# Patient Record
Sex: Male | Born: 1940 | Race: White | Hispanic: No | Marital: Single | Smoking: Former smoker
Health system: Southern US, Community
[De-identification: ages and names within clinical notes are randomized; demographics above are authoritative.]

## PROBLEM LIST (undated history)

## (undated) ENCOUNTER — Emergency Department (HOSPITAL_COMMUNITY): Admission: EM | Payer: Medicare Other

## (undated) DIAGNOSIS — E278 Other specified disorders of adrenal gland: Secondary | ICD-10-CM

## (undated) DIAGNOSIS — K5780 Diverticulitis of intestine, part unspecified, with perforation and abscess without bleeding: Secondary | ICD-10-CM

## (undated) DIAGNOSIS — C801 Malignant (primary) neoplasm, unspecified: Secondary | ICD-10-CM

## (undated) DIAGNOSIS — R339 Retention of urine, unspecified: Secondary | ICD-10-CM

## (undated) DIAGNOSIS — IMO0001 Reserved for inherently not codable concepts without codable children: Secondary | ICD-10-CM

## (undated) DIAGNOSIS — I482 Chronic atrial fibrillation, unspecified: Secondary | ICD-10-CM

## (undated) DIAGNOSIS — Z923 Personal history of irradiation: Secondary | ICD-10-CM

## (undated) DIAGNOSIS — K219 Gastro-esophageal reflux disease without esophagitis: Secondary | ICD-10-CM

## (undated) DIAGNOSIS — F1021 Alcohol dependence, in remission: Secondary | ICD-10-CM

## (undated) DIAGNOSIS — I313 Pericardial effusion (noninflammatory): Secondary | ICD-10-CM

## (undated) DIAGNOSIS — D638 Anemia in other chronic diseases classified elsewhere: Secondary | ICD-10-CM

## (undated) DIAGNOSIS — E46 Unspecified protein-calorie malnutrition: Secondary | ICD-10-CM

## (undated) DIAGNOSIS — T4145XA Adverse effect of unspecified anesthetic, initial encounter: Secondary | ICD-10-CM

## (undated) DIAGNOSIS — E871 Hypo-osmolality and hyponatremia: Principal | ICD-10-CM

## (undated) DIAGNOSIS — D6481 Anemia due to antineoplastic chemotherapy: Secondary | ICD-10-CM

## (undated) DIAGNOSIS — I3139 Other pericardial effusion (noninflammatory): Secondary | ICD-10-CM

## (undated) DIAGNOSIS — R918 Other nonspecific abnormal finding of lung field: Secondary | ICD-10-CM

## (undated) DIAGNOSIS — C3490 Malignant neoplasm of unspecified part of unspecified bronchus or lung: Secondary | ICD-10-CM

## (undated) DIAGNOSIS — S82899A Other fracture of unspecified lower leg, initial encounter for closed fracture: Secondary | ICD-10-CM

## (undated) DIAGNOSIS — F32A Depression, unspecified: Secondary | ICD-10-CM

## (undated) DIAGNOSIS — R530 Neoplastic (malignant) related fatigue: Secondary | ICD-10-CM

## (undated) DIAGNOSIS — F419 Anxiety disorder, unspecified: Secondary | ICD-10-CM

## (undated) DIAGNOSIS — I4819 Other persistent atrial fibrillation: Secondary | ICD-10-CM

## (undated) DIAGNOSIS — I10 Essential (primary) hypertension: Secondary | ICD-10-CM

## (undated) DIAGNOSIS — R296 Repeated falls: Secondary | ICD-10-CM

## (undated) DIAGNOSIS — E2749 Other adrenocortical insufficiency: Secondary | ICD-10-CM

## (undated) DIAGNOSIS — F329 Major depressive disorder, single episode, unspecified: Secondary | ICD-10-CM

## (undated) DIAGNOSIS — S7290XA Unspecified fracture of unspecified femur, initial encounter for closed fracture: Secondary | ICD-10-CM

## (undated) DIAGNOSIS — IMO0002 Reserved for concepts with insufficient information to code with codable children: Secondary | ICD-10-CM

## (undated) DIAGNOSIS — W19XXXA Unspecified fall, initial encounter: Secondary | ICD-10-CM

## (undated) DIAGNOSIS — T451X5A Adverse effect of antineoplastic and immunosuppressive drugs, initial encounter: Secondary | ICD-10-CM

## (undated) DIAGNOSIS — N400 Enlarged prostate without lower urinary tract symptoms: Secondary | ICD-10-CM

## (undated) HISTORY — DX: Malignant (primary) neoplasm, unspecified: C80.1

## (undated) HISTORY — DX: Other nonspecific abnormal finding of lung field: R91.8

## (undated) HISTORY — DX: Alcohol dependence, in remission: F10.21

## (undated) HISTORY — DX: Benign prostatic hyperplasia without lower urinary tract symptoms: N40.0

## (undated) HISTORY — PX: OTHER SURGICAL HISTORY: SHX169

## (undated) HISTORY — DX: Personal history of irradiation: Z92.3

## (undated) HISTORY — PX: COLON SURGERY: SHX602

## (undated) HISTORY — DX: Reserved for inherently not codable concepts without codable children: IMO0001

## (undated) HISTORY — DX: Anemia due to antineoplastic chemotherapy: D64.81

## (undated) HISTORY — DX: Hypo-osmolality and hyponatremia: E87.1

## (undated) HISTORY — DX: Neoplastic (malignant) related fatigue: R53.0

## (undated) HISTORY — DX: Other specified disorders of adrenal gland: E27.8

## (undated) HISTORY — DX: Reserved for concepts with insufficient information to code with codable children: IMO0002

## (undated) HISTORY — PX: TONSILLECTOMY: SUR1361

## (undated) HISTORY — PX: HEMORRHOID SURGERY: SHX153

## (undated) HISTORY — DX: Other adrenocortical insufficiency: E27.49

## (undated) HISTORY — DX: Adverse effect of antineoplastic and immunosuppressive drugs, initial encounter: T45.1X5A

## (undated) HISTORY — PX: COLONOSCOPY: SHX174

## (undated) HISTORY — DX: Anemia in other chronic diseases classified elsewhere: D63.8

---
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
## 1993-12-05 HISTORY — PX: ANKLE ARTHRODESIS: SUR49

## 1998-08-10 ENCOUNTER — Emergency Department (HOSPITAL_COMMUNITY): Admission: EM | Admit: 1998-08-10 | Discharge: 1998-08-10 | Payer: Self-pay | Admitting: Emergency Medicine

## 1998-09-25 ENCOUNTER — Inpatient Hospital Stay (HOSPITAL_COMMUNITY): Admission: EM | Admit: 1998-09-25 | Discharge: 1998-09-26 | Payer: Self-pay | Admitting: Emergency Medicine

## 1998-09-25 ENCOUNTER — Encounter: Payer: Self-pay | Admitting: Emergency Medicine

## 2003-08-10 ENCOUNTER — Emergency Department (HOSPITAL_COMMUNITY): Admission: EM | Admit: 2003-08-10 | Discharge: 2003-08-10 | Payer: Self-pay | Admitting: Emergency Medicine

## 2003-12-06 HISTORY — PX: EXCISION / CURETTAGE BONE CYST FINGER: SUR476

## 2003-12-09 ENCOUNTER — Ambulatory Visit (HOSPITAL_COMMUNITY): Admission: RE | Admit: 2003-12-09 | Discharge: 2003-12-09 | Payer: Self-pay | Admitting: Orthopedic Surgery

## 2003-12-09 ENCOUNTER — Ambulatory Visit (HOSPITAL_BASED_OUTPATIENT_CLINIC_OR_DEPARTMENT_OTHER): Admission: RE | Admit: 2003-12-09 | Discharge: 2003-12-09 | Payer: Self-pay | Admitting: Orthopedic Surgery

## 2004-02-12 ENCOUNTER — Emergency Department (HOSPITAL_COMMUNITY): Admission: EM | Admit: 2004-02-12 | Discharge: 2004-02-12 | Payer: Self-pay | Admitting: Emergency Medicine

## 2004-07-01 ENCOUNTER — Emergency Department (HOSPITAL_COMMUNITY): Admission: EM | Admit: 2004-07-01 | Discharge: 2004-07-01 | Payer: Self-pay | Admitting: Emergency Medicine

## 2005-08-01 ENCOUNTER — Inpatient Hospital Stay (HOSPITAL_COMMUNITY): Admission: EM | Admit: 2005-08-01 | Discharge: 2005-08-02 | Payer: Self-pay | Admitting: Emergency Medicine

## 2007-03-05 ENCOUNTER — Ambulatory Visit (HOSPITAL_COMMUNITY): Admission: RE | Admit: 2007-03-05 | Discharge: 2007-03-05 | Payer: Self-pay | Admitting: Chiropractic Medicine

## 2007-05-29 ENCOUNTER — Inpatient Hospital Stay (HOSPITAL_COMMUNITY): Admission: EM | Admit: 2007-05-29 | Discharge: 2007-05-30 | Payer: Self-pay | Admitting: Emergency Medicine

## 2010-08-31 ENCOUNTER — Emergency Department (HOSPITAL_COMMUNITY): Admission: EM | Admit: 2010-08-31 | Discharge: 2010-08-31 | Payer: Self-pay | Admitting: Emergency Medicine

## 2010-09-01 ENCOUNTER — Emergency Department (HOSPITAL_COMMUNITY)
Admission: EM | Admit: 2010-09-01 | Discharge: 2010-09-01 | Payer: Self-pay | Source: Home / Self Care | Admitting: Emergency Medicine

## 2010-09-02 ENCOUNTER — Ambulatory Visit: Payer: Self-pay | Admitting: Hematology and Oncology

## 2010-09-03 LAB — PROTHROMBIN TIME: INR: 0.9 (ref <1.50)

## 2010-09-03 LAB — COMPREHENSIVE METABOLIC PANEL
Albumin: 4.2 g/dL (ref 3.5–5.2)
Creatinine, Ser: 0.88 mg/dL (ref 0.40–1.50)
Sodium: 129 mEq/L — ABNORMAL LOW (ref 135–145)
Total Bilirubin: 1.8 mg/dL — ABNORMAL HIGH (ref 0.3–1.2)
Total Protein: 6.7 g/dL (ref 6.0–8.3)

## 2010-09-03 LAB — CBC WITH DIFFERENTIAL/PLATELET
BASO%: 0.1 % (ref 0.0–2.0)
EOS%: 1.7 % (ref 0.0–7.0)
LYMPH%: 11.6 % — ABNORMAL LOW (ref 14.0–49.0)
MCHC: 35.1 g/dL (ref 32.0–36.0)
RDW: 13.5 % (ref 11.0–14.6)
WBC: 9.2 10*3/uL (ref 4.0–10.3)
lymph#: 1.1 10*3/uL (ref 0.9–3.3)

## 2010-09-03 LAB — MORPHOLOGY: PLT EST: ADEQUATE

## 2010-09-03 LAB — APTT: aPTT: 29 seconds (ref 24–37)

## 2010-09-03 LAB — LACTATE DEHYDROGENASE: LDH: 132 U/L (ref 94–250)

## 2010-09-07 ENCOUNTER — Ambulatory Visit (HOSPITAL_COMMUNITY): Admission: RE | Admit: 2010-09-07 | Discharge: 2010-09-07 | Payer: Self-pay | Admitting: Hematology and Oncology

## 2010-09-07 LAB — SPEP & IFE WITH QIG
Albumin ELP: 64.2 % (ref 55.8–66.1)
Beta 2: 3.6 % (ref 3.2–6.5)
Beta Globulin: 5.5 % (ref 4.7–7.2)

## 2010-09-07 LAB — PSA: PSA: 4.56 ng/mL — ABNORMAL HIGH (ref 0.10–4.00)

## 2010-09-08 ENCOUNTER — Ambulatory Visit (HOSPITAL_COMMUNITY): Admission: RE | Admit: 2010-09-08 | Discharge: 2010-09-08 | Payer: Self-pay | Admitting: Hematology and Oncology

## 2010-09-09 LAB — RETICULOCYTES
RBC: 3.28 10*6/uL — ABNORMAL LOW (ref 4.20–5.82)
Retic %: 3.62 % — ABNORMAL HIGH (ref 0.50–1.60)
Retic Ct Abs: 118.74 10*3/uL — ABNORMAL HIGH (ref 24.10–77.50)

## 2010-09-09 LAB — IRON AND TIBC
Iron: 90 ug/dL (ref 42–165)
TIBC: 286 ug/dL (ref 215–435)

## 2010-09-09 LAB — FERRITIN: Ferritin: 833 ng/mL — ABNORMAL HIGH (ref 22–322)

## 2010-09-17 LAB — IMMUNOFIXATION ELECTROPHORESIS
IgA: 132 mg/dL (ref 68–378)
IgG (Immunoglobin G), Serum: 840 mg/dL (ref 694–1618)
IgM, Serum: 97 mg/dL (ref 60–263)

## 2010-11-25 ENCOUNTER — Encounter
Admission: RE | Admit: 2010-11-25 | Discharge: 2010-11-25 | Payer: Self-pay | Source: Home / Self Care | Attending: Family Medicine | Admitting: Family Medicine

## 2010-12-14 ENCOUNTER — Encounter
Admission: RE | Admit: 2010-12-14 | Discharge: 2010-12-14 | Payer: Managed Care, Other (non HMO) | Source: Home / Self Care | Attending: Orthopedic Surgery | Admitting: Orthopedic Surgery

## 2011-02-17 LAB — BASIC METABOLIC PANEL
BUN: 10 mg/dL (ref 6–23)
BUN: 12 mg/dL (ref 6–23)
CO2: 25 mEq/L (ref 19–32)
Calcium: 9.4 mg/dL (ref 8.4–10.5)
Chloride: 91 mEq/L — ABNORMAL LOW (ref 96–112)
Creatinine, Ser: 0.97 mg/dL (ref 0.4–1.5)
Creatinine, Ser: 1.06 mg/dL (ref 0.4–1.5)
GFR calc Af Amer: >60 mL/min (ref >60)
GFR calc non Af Amer: >60 mL/min (ref >60)
GFR calc non Af Amer: >60 mL/min (ref >60)
Glucose, Bld: 120 mg/dL — ABNORMAL HIGH (ref 70–99)
Potassium: 4.5 mEq/L (ref 3.5–5.1)
Potassium: 5.2 mEq/L — ABNORMAL HIGH (ref 3.5–5.1)
Sodium: 127 mEq/L — ABNORMAL LOW (ref 135–145)

## 2011-02-17 LAB — DIFFERENTIAL
Basophils Relative: 0 % (ref 0–1)
Eosinophils Relative: 0 % (ref 0–5)
Lymphocytes Relative: 7 % — ABNORMAL LOW (ref 12–46)
Lymphs Abs: 0.6 10*3/uL — ABNORMAL LOW (ref 0.7–4.0)
Monocytes Absolute: 0.6 10*3/uL (ref 0.1–1.0)
Neutro Abs: 7.2 10*3/uL (ref 1.7–7.7)
Neutro Abs: 7.3 10*3/uL (ref 1.7–7.7)
Neutrophils Relative %: 83 % — ABNORMAL HIGH (ref 43–77)

## 2011-02-17 LAB — CBC
HCT: 33.6 % — ABNORMAL LOW (ref 39.0–52.0)
HCT: 40.6 % (ref 39.0–52.0)
Hemoglobin: 14.8 g/dL (ref 13.0–17.0)
MCHC: 36.3 g/dL — ABNORMAL HIGH (ref 30.0–36.0)
MCHC: 36.5 g/dL — ABNORMAL HIGH (ref 30.0–36.0)
MCV: 99 fL (ref 78.0–100.0)
Platelets: 255 10*3/uL (ref 150–400)
RBC: 3.36 MIL/uL — ABNORMAL LOW (ref 4.22–5.81)
RDW: 13.3 % (ref 11.5–15.5)
WBC: 8.7 10*3/uL (ref 4.0–10.5)

## 2011-02-17 LAB — POCT CARDIAC MARKERS

## 2011-04-19 NOTE — Discharge Summary (Signed)
Anthony Curry, Anthony Curry               ACCOUNT NO.:  [id number]   MEDICAL RECORD NO.:  [id number]          PATIENT TYPE:  INP   LOCATION:  6707                         FACILITY:  [facility]   PHYSICIAN:  Kela Millin, M.D.DATE OF BIRTH:  12/23/1940   DATE OF ADMISSION:  05/29/2007  DATE OF DISCHARGE:  05/30/2007                               DISCHARGE SUMMARY   PRIMARY CARE PHYSICIAN:  Molly Maduro A. Nicholos Johns, M.D.   CHIEF COMPLAINT:  Question of chills and chest pain.   HISTORY OF PRESENT ILLNESS:  The patient is a 70 year old white male  with a past medical history significant for GERD, hypertension,  hyponatremia, and syncope in the past, who presents with the above  complaint.  He states that he was in his usual state of health until the  morning of admission when he just had generalized malaise and then felt  cold and shaky.  Because he continued to feel this way for most of the  morning, he decided to come to the ER.  On the way to the ER, his wife  was driving.  He began having heartburn.  He states that he has had  this intermittently and is on Prilosec and Zantac at home for GERD and  that he had taken his medications for the day.  He describes the chest  pain as a burning sensation, 5/10 in intensity at its worst, in the  middle of his upper chest area, and denies associated nausea, vomiting,  radiation, diaphoresis, and no shortness of breath.  He states that  three days ago, he had a similar pain, and it resolved on its own.  He  denies cough, hematemesis, abdominal pain, diarrhea, constipation,  dysuria, and no hematochezia.  It was noted that the patient had been  admitted in 2006 with similar complaints, and workup at that time  included a stress Cardiolite, which was within normal limits.   The patient was seen in the ER, and his labs revealed a sodium of 121.  His point-of-care markers were negative.  Urinalysis showed 3-6 WBCs,  many bacteria, and cloudy appearance and small  leukocyte esterase.  He  is admitted for further evaluation and management.   PAST MEDICAL HISTORY:  As stated above.  Noted per E-chart that the  patient's hyponatremia was attributed to diuretic use, and it was  discontinued, but they noted that the hyponatremia persisted even  thereafter.  History of heavy alcohol use.   MEDICATIONS:  1. Carafate p.r.n.  2. Norvasc 10 mg daily.  3. Atenolol 100 mg daily.  4. Benicar/HCT 40/25 mg daily.  5. Zantac 300 b.i.d.  6. Prilosec.   ALLERGIES:  NKDA.   SOCIAL HISTORY:  He states that he drinks about six beers a day as well  as wine with his dinner and vodka about once a week.  The patient states  that he has never had alcohol withdrawal/DTs.  He quit tobacco about 7-  10 years ago.   FAMILY HISTORY:  His brother had MI x2, age unknown.  Also had  hypertension and is deceased secondary to brain tumor.  His mother had  lung cancer, coronary artery disease.  His father, hypertension, and his  sister, hypertension.   REVIEW OF SYSTEMS:  As per HPI, other review of systems negative.   PHYSICAL EXAMINATION:  GENERAL:  The patient is an elderly male.  He is  alert, oriented, in no apparent distress.  VITAL SIGNS:  His temperature is 97.9, blood pressure 154/72, pulse 86,  respiratory rate 18, O2 sat 99%.  HEENT:  PERRL.  EOMI.  Sclerae are anicteric.  Moist mucous membranes.  No oral exudate.  NECK:  Supple.  No adenopathy.  No thyromegaly.  No JVD.  LUNGS:  Moderate air movement.  No crackles.  No wheezes.  CARDIOVASCULAR:  Regular rate and rhythm.  Normal S1 and S2.  ABDOMEN:  Soft, bowel sounds present.  Nontender.  Nondistended.  No  masses palpable.  EXTREMITIES:  No cyanosis and no edema.  No tremors noted.  NEURO:  He is alert and oriented x3.  Cranial nerves II-XII are grossly  intact.  Strength is 5/5 and symmetric.  Tongue midline.   LABORATORY DATA:  His urinalysis is as per HPI.  Sodium 121, potassium  3.6, chloride 88,  BUN 9, glucose 126.  His pH is 7.4, pCO2 41.  His  white cell count is 7.4, hemoglobin 15, platelet count 267.  His MCV is  99.8, neutrophil count is 81%.  Point-of-care markers are negative x1.   His EKG shows some ST depression in lead II and nonsignificant  depression in III and aVF.   ASSESSMENT/PLAN:  1. Chest pain:  As discussed above with above-noted EKG findings.  The      patient is given nitroglycerin, GI cocktail, and morphine in the ER      without relief.  Will obtain serial cardiac enzymes, start on      aspirin, nitroglycerin.  Continue beta blocker and ARB.  Will place      patient on PPI in effort to cover GI, as he has a history of GERD,      as discussed.  Will obtain a fasting lipid profile and follow.      Also follow and consider cardiac consultation pending the enzymes.  2. Hyponatremia:  Likely diuretic-induced.  Will discontinue HCTZ,      hydrate, check a TSH, and follow.  3. Probable urinary tract infection:  Obtain cultures, empiric      antibiotics.  4. 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  Will continue PPI and      Carafate, as above.  5. Heavy alcohol use:  Will place on multivitamins, thiamine, and      Ativan p.r.n.  Social work/case management consult for community      resources to quit alcohol.  6. Hypertension:  Will continue outpatient medications except for      HCTZ, as discussed above.      Kela Millin, M.D.  Electronically Signed     ACV/MEDQ  D:  05/30/2007  T:  05/30/2007  Job:  [id number]   cc:   Molly Maduro A. Nicholos Johns, M.D.

## 2011-04-19 NOTE — Discharge Summary (Signed)
NAMEDONNOVAN, STAMOUR               ACCOUNT NO.:  [id number]   MEDICAL RECORD NO.:  [id number]          PATIENT TYPE:  INP   LOCATION:  6707                         FACILITY:  [facility]   PHYSICIAN:  Corinna L. Lendell Caprice, MDDATE OF BIRTH:  [birth year]   DATE OF ADMISSION:  05/29/2007  DATE OF DISCHARGE:  05/30/2007                               DISCHARGE SUMMARY   DISCHARGE DIAGNOSES:  1. Hyponatremia.  2. Chest pain, suspect gastroesophageal reflux disease.  3. Hypokalemia.  4. Hypertension.  5. Asymptomatic bacteriuria.   DISCHARGE MEDICATIONS:  1. He is to take Benicar 40 mg a day instead of Benicar/HCT.  2. KCl 40 mEq daily for 5 more days.  3. Prilosec 40 mg a day.  4. Stop Zantac.  5. Continue amlodipine 10 mg a day.  6. Atenolol 100 mg a day.  7. Carafate q.i.d.   FOLLOW UP:  With Dr. Elias Else within 1 week to check sodium and  potassium levels.   CONDITION:  Stable.   CONSULTATIONS:  None.   DIET:  Regular.   ACTIVITY:  Ad lib.   PERTINENT LABORATORIES:  CBC unremarkable.  Initial sodium was 121,  potassium 3.6, chloride 88, glucose 126, BUN 9, creatinine 0.8,  bicarbonate 25.  At discharge, his sodium is 129, potassium is 3 before  repletion, chloride 93.  Serial cardiac enzymes negative.  LDL 95, HDL  55, total cholesterol 158, triglycerides 40.  Urinalysis showed small  leukocyte esterase, negative nitrites, 3-6 white cells, 0-2 red cells,  many bacteria.   SPECIAL STUDIES:  EKG showed normal sinus rhythm, with nonspecific  changes   HISTORY AND HOSPITAL COURSE:  Mr. Normington is a pleasant 70 year old white  male who presented with jitteriness. He also had chest burning.  He had  a negative stress test in 2006, and has a history of gastroesophageal  reflux disease.  He had been on Zantac and Carafate as an outpatient,  but had been on proton pump inhibitor in the past, which seemed to  improve his reflux symptoms better than Zantac.  He was started on  Benicar/HCT as few months ago, and actually has a history of  hyponatremia in the past related to diuretic, according to the patient.  There is also concern of alcohol abuse.  The patient reports 2-6 drinks  a day, but denies alcohol abuse. The case was discussed with the  admitting physician, Dr. Suanne Marker.  However, no H&P is currently available  in the computer.  Please see H&P for complete admission details.  The  patient was started on normal saline.  His hydrochlorothiazide was  stopped, and he was placed on telemetry, where he remained in normal  sinus rhythm.  He had normal vital signs.  His TSH was normal at 2.23.  His jitteriness resolved, as did his chest pain, and he is requesting to  go home.  He is ambulating fine and is alert and oriented.  His lungs  are clear.  His heart is regular.  Abdomen soft.  Extremities  extremities without clubbing, cyanosis, or edema.  I have recommended  that he take  Prilosec 40 mg a day instead of the Zantac.  We have given  him a dose of KCl prior to discharge, and I have given him a  prescription for 5 more days of potassium.  I have recommended that he  stop the Benicar/HCT and instead take Benicar 40 mg a day.  He will need  a follow-up basic metabolic panel next week with Dr. Nicholos Johns.  Initially,  the patient was a full admission, but his status was changed to  observation.      Corinna L. Lendell Caprice, MD  Electronically Signed     CLS/MEDQ  D:  05/30/2007  T:  05/31/2007  Job:  [id number]   cc:   Molly Maduro A. Nicholos Johns, M.D.

## 2011-04-22 NOTE — Op Note (Signed)
NAME:  ZHYON, ANTENUCCI                         ACCOUNT NO.:  [id number]   MEDICAL RECORD NO.:  [id number]                   PATIENT TYPE:  AMB   LOCATION:  DSC                                  FACILITY:  [facility]   PHYSICIAN:  Katy Fitch. Naaman Plummer., M.D.          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   OPERATION:  Excision of a mucous cyst on the dorsal ulnar aspect of the  right thumb nail fold followed  by arthrotomy of the IP joint  with removal  of multiple loose bodies and debridement of osteophytes  and joint  irrigation with sterile saline.   SURGEON:  Katy Fitch. Sypher, M.D.   ASSISTANT:  Marveen Reeks. Dasnoit, P.A.-C.   ANESTHESIA:  0.25% Marcaine and 2% Lidocaine metacarpal head level block of  right thumb, supplemented by IV sedation, supervising anesthesiologist  Janetta Hora. Gelene Mink, M.D.   INDICATIONS FOR PROCEDURE:  Mataeo Ingwersen is a 70 year old gentleman  referred  for evaluation  and management  of a very large mucous cyst involving the  right thumb IP joint  and nail fold region. He had minor nail dystrophy. He  had underlying osteoarthrosis evidenced by Heberden's and Bouchard's nodes.  Plain films demonstrated significant arthritis of his thumb IP joint.  After informed consent was obtained he is brought to the operating room at  this time for debridement of his IP joint, cyst excision and  nail fold  decompression.   DESCRIPTION OF PROCEDURE:   Allin Frix is brought to the operating room and  placed in the supine position upon the operating table. Following light  sedation, the right arm was prepped with Betadine soap and solution and  sterilely draped. Then 0.25% Marcaine and 2% Lidocaine was infiltrated at  the metacarpal head level to attain a digital block. When anesthesia was  satisfactory the procedure commenced with exsanguination of the thumb and  placement of a 1/2 inch Penrose drain at the base of the thumb as a digital  tourniquet.   A curvilinear incision was fashioned directly over  the cyst. The  subcutaneous tissues were carefully divided, identifying the cyst cavity.  The membrane of the cyst was circumferentially dissected and removed. This  was followed back to the junction  of  the extensor tendon and ulnar  collateral ligament. A triangular portion of the capsule was excised,  followed  by exploration of the IP joint.   The IP  joint  was irrigated with 3 rather sizeable osseocartilaginous loose  bodies being recovered. The osteophytes on the adjacent surfaces of the  proximal and distal phalanges were  excised with a small  rongeur. The joint  was then thoroughly lavaged with sterile saline utilizing an 18 gauge needle  and syringe to create significant pressure. The wound was then repaired with  interrupted suture of 5-0 nylon followed  by placement  of a sterile  dressing of Xeroform, sterile gauze and Coban.   Mr. Waymon Amato tolerated the procedure well. He was transferred awake and alert to  the recovery room with stable vital signs.                                               Katy Fitch Naaman Plummer., M.D.    RVS/MEDQ  D:  12/09/2003  T:  12/09/2003  Job:  [id number]   cc:   Molly Maduro A. Nicholos Johns, M.D.  [address]  [place]  [place] [address]  Fax: [phone]

## 2011-04-22 NOTE — H&P (Signed)
NAMEGLEEN, RIPBERGER NO.:  [id number]   MEDICAL RECORD NO.:  [id number]          PATIENT TYPE:  EMS   LOCATION:  MAJO                         FACILITY:  [facility]   PHYSICIAN:  Deirdre Peer. Polite, M.D. DATE OF BIRTH:  [birth year]   DATE OF ADMISSION:  08/01/2005  DATE OF DISCHARGE:                                HISTORY & PHYSICAL   CHIEF COMPLAINT:  Chest discomfort.   HISTORY OF PRESENT ILLNESS:  A 70 year old male with known history of  hypertension, GERD, recent evaluation for syncope approximately three weeks  ago in [place] who presents to the ED for evaluation of funny feeling in  his chest.  Patient states that he was in his usual state of health until  approximately 8:00 yesterday.  Patient had an uncomfortable sensation in his  chest which seemed to radiate to both sides of his chest and had association  of his arm feeling cool.  Patient denies any palpitations.  No diaphoresis.  Patient states that he has had symptoms like this before attributed to his  GERD.  Typically when the patient has this sensation he takes simethicone  and gets relief.  However, after yesterday's event the patient did not  experience any relief.  Patient had persistent discomfort throughout the  night.  He was unable to sleep.  Because of that the patient presented to  the ED for evaluation.  In the ED the patient was evaluated.  Cardiac  enzymes within normal limits.  EKG within normal limits.  At baseline  patient denies any orthopnea, PND, or dyspnea on exertion.  Is unable to  really ascertain the patient's exercise tolerance as he does not exert  himself.  Patient does have a family history of early coronary artery  disease, brother with an MI x2, mother with coronary artery disease.  Patient denies high cholesterol, diabetes, or CVA.  Patient does admit to  having trouble with abnormal sensation in his chest approximately three to  four years ago and at that time had a stress  test reportedly with negative  results.  Please note also the patient has had a recent evaluation  approximately three weeks ago at the hospital in [place] for evaluation of  syncope presumed secondary to orthostasis plus or minus BP medications.  At  this time the results of that evaluation are unavailable.  Because of the  patient's sensation of atypical chest pain and risk factors for coronary  disease, i.e., family history, tobacco use, hypertension, and male greater  than 55 admission is deemed necessary for further evaluation and treatment.   PAST MEDICAL HISTORY:  1.  Hypertension.  2.  GERD.  3.  ETOH use to excess.  4.  Recent history of syncope approximately three weeks ago.  5.  Hyponatremia presumed secondary to diuretic; however, persists after      discontinuing diuretic.   MEDICATIONS ON ADMISSION:  Benicar, atenolol, Prilosec.   SOCIAL HISTORY:  Negative for tobacco x15 years.  Prior to that two packs a  day x30 years.  No drugs.  Alcohol to excess approximately six beers a  day.   PAST SURGICAL HISTORY:  Significant for hemorrhoid surgery in the distant  past and right ankle surgery for a fracture.   ALLERGIES:  No known drug allergies.   FAMILY HISTORY:  Brother:  History of MI x2, deceased secondary to a brain  tumor.  Also had hypertension.  Mother with coronary artery disease and lung  cancer.  Father:  Hypertension.  Sister:  Hypertension.   REVIEW OF SYSTEMS:  As stated in HPI.  Negative for headache, dizziness,  nausea, vomiting, diarrhea, constipation.  No blood in the stool.  No blood  in the urine.  No hemoptysis.  No productive cough.   PHYSICAL EXAMINATION:  GENERAL:  Patient is alert and oriented x3.  No  apparent distress.  VITAL SIGNS:  Temperature 98, blood pressure 192/99, pulse 64, respiratory  rate 20, saturating 100%.  HEENT:  Within normal limits.  CHEST:  Clear to auscultation without rales, rhonchi, or rub.  CARDIOVASCULAR:  Regular S1,  S2.  No S3 appreciated.  No murmurs, rubs, or  gallops.  ABDOMEN:  Soft, nontender.  No hepatosplenomegaly.  EXTREMITIES:  No clubbing, cyanosis, edema.  Patient does have deformity in  the right ankles most likely secondary to prior surgery.  Pulse 2+, intact.  NEUROLOGIC:  Nonfocal.   DATA:  EKG:  Normal sinus rhythm.  No Qs.  No ST elevation.  Chest x-ray:  Hyperinflated lung fields consistent with COPD plus or minus emphysema.  CBC:  White count 6.1, hemoglobin 16, MCV 100.7, platelets 273, neutrophil  count 75, myoglobin 68.3.  CK-MB less than 1, troponin I less than 0.05.  BMET:  Sodium 125, potassium 4.2, chloride 89, carbon dioxide 26, glucose  120, BUN 7, creatinine 0.8, calcium 9.3, AST and ALT 26 and 19,  respectively, bilirubin 1.5, lipase 19.   ASSESSMENT:  1.  Atypical chest pain.  Differential diagnosis includes coronary artery      disease versus gastroesophageal reflux disease versus pulmonary      embolism.  2.  Hypertension.  3.  Gastroesophageal reflux disease.  4.  History of syncope evaluated approximately three weeks ago.  5.  Hyponatremia of 125.  Please note the patient has been off diuretic.      Must rule out secondary to excessive beer ingestion which could be      acting as a diuretic versus other etiology.  6.  Alcohol use to excess.  Recommend patient be admitted to a telemetry      floor bed.  Patient will have serial cardiac enzymes.  Will obtain a D-      dimer.  If positive will obtain CT of chest to rule out pulmonary      embolism.  Will check lipids and thyroid function studies.  Will request      a cardiac evaluation for further risk stratification.  If the above      studies are negative patient will require GI evaluation on an outpatient      basis.      Deirdre Peer. Polite, M.D.  Electronically Signed     RDP/MEDQ  D:  08/01/2005  T:  08/01/2005  Job:  [id number]  cc:   Molly Maduro A. Nicholos Johns, M.D.  Fax: [phone]

## 2011-04-22 NOTE — Consult Note (Signed)
NAMEKASHMERE, STAFFA NO.:  [id number]   MEDICAL RECORD NO.:  [id number]          PATIENT TYPE:  INP   LOCATION:  2034                         FACILITY:  [facility]   PHYSICIAN:  Meade Maw, M.D.    DATE OF BIRTH:  1941-06-16   DATE OF CONSULTATION:  08/01/2005  DATE OF DISCHARGE:                                   CONSULTATION   REFERRING PHYSICIAN:  Dr. Elana Alm. [name].   INDICATION FOR CONSULTATION:  Atypical chest pain.   HISTORY:  Anthony Curry is a very pleasant 70 year old gentleman with past medical  history of hypertension, tobacco use and family history, who recently had an  episode of near-syncope while vacationing in [place].  He was admitted to  the hospital and was noted to be orthostatic.  He was found to have  hyponatremia and hypokalemia; this was felt to be secondary to  hydrochlorothiazide; his hydrochlorothiazide was subsequently discontinued.  The patient now presents with a 2-day to 3-day history of epigastric  discomfort described as an uncomfortable sensation that previously had been  relieved with simethicone, but notes no relief at this time with Mylanta,  PPI or simethicone.  The pain has been constant.  There appear to be no  aggravating or alleviating factors noted.  The patient has had similar  symptoms 3 years prior.  Stress test performed in [place] was negative for  inducible ischemia.   REVIEW OF SYSTEMS:  He has had no fevers, no chills, no cough.  He has had  no peripheral edema, no bowel or bladder changes, no palpitations, no  tachyarrhythmias.  He has had no further dizziness since his discharge from  [place].   PAST MEDICAL HISTORY:  1.  Hypertension.  2.  GERD.  3.  Alcohol misuse.   SOCIAL HISTORY:  He stopped smoking 15 years prior and smoked 2 packs per  day for 30 years, daily alcohol intake, has retired in March of this year,  is married and lives with his wife.   FAMILY HISTORY:  He had 1 brother with myocardial  infarction at age 55 who  continues to smoke heavily.  Mother had coronary artery disease at age 59.  Father had hypertension, sister with hypertension.   ALLERGIES:  He has no known drug allergies.   MEDICATIONS PRIOR TO ADMISSION:  Prilosec, atenolol and Benicar.   PHYSICAL EXAMINATION:  GENERAL:  Physical exam reveals an elderly male  appearing his stated age in no acute distress.  He continues to have the  uncomfortable sensation in his mid-chest.  VITAL SIGNS:  Blood pressure is 156/87.  He is afebrile.  Heart rate is 63.  HEENT:  Unremarkable.  There is no neck vein distention.  PULMONARY:  Exam reveals breath sounds which are equal and clear to  auscultation.  No use of accessory muscles.  His O2 saturation is 97% on  room air.  CARDIOVASCULAR:  Exam reveals a regular rate and rhythm, normal S1, normal  S2.  No murmurs, rubs, or gallops noted.  ABDOMEN:  Soft, benign and nontender.  EXTREMITIES:  Extremities reveal no peripheral edema.  There is no clubbing  or cyanosis.  His distal pulses are weak but palpable.  NEUROLOGIC:  Nonfocal.  SKIN:  Warm and dry.   LABORATORY DATA:  White count 6.1, hematocrit 45, platelet count 273,000.  Potassium is 4.2, creatinine 0.8.  Liver function tests reveal mild  elevation in his total bilirubin.  Lipase is normal.  His serial cardiac  enzymes have been negative.  His D-dimer is mildly elevated at 0.61.   His chest x-ray reveals COPD.   His ECG reveals sinus rhythm with T wave inversions in aVL, low voltage, no  change from his ECG in January 2005.   IMPRESSION:  1.  Chest pain, atypical for cardiac.  We will proceed with a stress      Cardiolite, in view of his risk factors.  Would recommend a fasting      lipid profile if this has not improved if this has not previously been      performed.  I would also consider further evaluation for possible      pulmonary embolus in view of this mildly elevated D-dimer.  2.  Hypertension.   Blood pressure is controlled.  We will continue with      angiotensin II receptor blocker and beta blockers.  3.  Hyponatremia.  He continues to have low sodium at 125.  Of note, the      patient is off the hydrochlorothiazide.  This may be related to his      daily beer consumption.  4.  Gastroesophageal reflux disease.  Continue with his proton pump      inhibitor.  5.  Insomnia.  The patient has had difficulty with sleep since being in the      hospital.  We will prescribe with Ambien 10 mg tonight.      Meade Maw, M.D.  Electronically Signed     HP/MEDQ  D:  08/01/2005  T:  08/02/2005  Job:  [id number]

## 2011-05-13 ENCOUNTER — Other Ambulatory Visit: Payer: Self-pay | Admitting: Family Medicine

## 2011-05-13 DIAGNOSIS — R918 Other nonspecific abnormal finding of lung field: Secondary | ICD-10-CM

## 2011-06-02 ENCOUNTER — Ambulatory Visit
Admission: RE | Admit: 2011-06-02 | Discharge: 2011-06-02 | Disposition: A | Discharge status: Home / Self Care | Payer: Medicare Other | Source: Ambulatory Visit | Attending: Family Medicine | Admitting: Family Medicine

## 2011-06-02 DIAGNOSIS — R918 Other nonspecific abnormal finding of lung field: Secondary | ICD-10-CM

## 2011-09-21 LAB — CARDIAC PANEL(CRET KIN+CKTOT+MB+TROPI)
CK, MB: 2.4
CK, MB: 2.6
Relative Index: INVALID
Relative Index: INVALID
Total CK: 104
Total CK: 83
Troponin I: 0.02

## 2011-09-21 LAB — CBC
Hemoglobin: 13.8
Hemoglobin: 15
RBC: 3.94 — ABNORMAL LOW
RBC: 4.3
RDW: 12.2
WBC: 7.4

## 2011-09-21 LAB — I-STAT 8, (EC8 V) (CONVERTED LAB)
Acid-Base Excess: 1
BUN: 9
Chloride: 88 — ABNORMAL LOW
Potassium: 3.6
pH, Ven: 7.401 — ABNORMAL HIGH

## 2011-09-21 LAB — BASIC METABOLIC PANEL
Calcium: 8.6
GFR calc Af Amer: >60
GFR calc non Af Amer: >60
Sodium: 129 — ABNORMAL LOW

## 2011-09-21 LAB — URINALYSIS, ROUTINE W REFLEX MICROSCOPIC
Bilirubin Urine: NEGATIVE
Glucose, UA: NEGATIVE
Hgb urine dipstick: NEGATIVE
Specific Gravity, Urine: 1.013
pH: 6

## 2011-09-21 LAB — DIFFERENTIAL
Basophils Absolute: 0
Lymphocytes Relative: 10 — ABNORMAL LOW
Lymphs Abs: 0.7
Monocytes Absolute: 0.7
Monocytes Relative: 9
Neutro Abs: 5.9

## 2011-09-21 LAB — LIPID PANEL: Cholesterol: 158

## 2011-09-21 LAB — URINE MICROSCOPIC-ADD ON

## 2011-09-21 LAB — POCT CARDIAC MARKERS: Myoglobin, poc: 94.5

## 2011-09-21 LAB — POCT I-STAT CREATININE: Operator id: 146091

## 2011-10-15 ENCOUNTER — Emergency Department (HOSPITAL_COMMUNITY): Payer: Medicare Other

## 2011-10-15 ENCOUNTER — Other Ambulatory Visit: Payer: Self-pay

## 2011-10-15 ENCOUNTER — Inpatient Hospital Stay (HOSPITAL_COMMUNITY)
Admission: EM | Admit: 2011-10-15 | Discharge: 2011-10-18 | DRG: 392 | Disposition: A | Discharge status: Home / Self Care | Payer: Medicare Other | Source: Ambulatory Visit | Attending: Internal Medicine | Admitting: Internal Medicine

## 2011-10-15 ENCOUNTER — Encounter: Payer: Self-pay | Admitting: Emergency Medicine

## 2011-10-15 DIAGNOSIS — F1010 Alcohol abuse, uncomplicated: Secondary | ICD-10-CM

## 2011-10-15 DIAGNOSIS — E871 Hypo-osmolality and hyponatremia: Secondary | ICD-10-CM

## 2011-10-15 DIAGNOSIS — K219 Gastro-esophageal reflux disease without esophagitis: Secondary | ICD-10-CM | POA: Diagnosis present

## 2011-10-15 DIAGNOSIS — R112 Nausea with vomiting, unspecified: Secondary | ICD-10-CM

## 2011-10-15 DIAGNOSIS — E876 Hypokalemia: Secondary | ICD-10-CM

## 2011-10-15 DIAGNOSIS — M810 Age-related osteoporosis without current pathological fracture: Secondary | ICD-10-CM | POA: Diagnosis present

## 2011-10-15 DIAGNOSIS — I119 Hypertensive heart disease without heart failure: Secondary | ICD-10-CM

## 2011-10-15 DIAGNOSIS — R1013 Epigastric pain: Principal | ICD-10-CM | POA: Diagnosis present

## 2011-10-15 DIAGNOSIS — K8020 Calculus of gallbladder without cholecystitis without obstruction: Secondary | ICD-10-CM

## 2011-10-15 DIAGNOSIS — I10 Essential (primary) hypertension: Secondary | ICD-10-CM

## 2011-10-15 DIAGNOSIS — R1083 Colic: Secondary | ICD-10-CM

## 2011-10-15 HISTORY — DX: Essential (primary) hypertension: I10

## 2011-10-15 LAB — COMPREHENSIVE METABOLIC PANEL
ALT: 22 U/L (ref 0–53)
AST: 32 U/L (ref 0–37)
Albumin: 4 g/dL (ref 3.5–5.2)
CO2: 20 mEq/L (ref 19–32)
Calcium: 9.8 mg/dL (ref 8.4–10.5)
Chloride: 84 mEq/L — ABNORMAL LOW (ref 96–112)
GFR calc non Af Amer: >90 mL/min (ref >90)
Sodium: 121 mEq/L — ABNORMAL LOW (ref 135–145)

## 2011-10-15 LAB — CBC
HCT: 42.9 % (ref 39.0–52.0)
MCH: 35.5 pg — ABNORMAL HIGH (ref 26.0–34.0)
MCH: 37.4 pg — ABNORMAL HIGH (ref 26.0–34.0)
MCHC: 37.8 g/dL — ABNORMAL HIGH (ref 30.0–36.0)
MCV: 99.1 fL (ref 78.0–100.0)
Platelets: 191 10*3/uL (ref 150–400)
Platelets: 178 10*3/uL (ref 150–400)
RBC: 4.14 MIL/uL — ABNORMAL LOW (ref 4.22–5.81)
RDW: 12.8 % (ref 11.5–15.5)
WBC: 7.3 10*3/uL (ref 4.0–10.5)
WBC: 9.3 10*3/uL (ref 4.0–10.5)

## 2011-10-15 LAB — MAGNESIUM: Magnesium: 1.5 mg/dL (ref 1.5–2.5)

## 2011-10-15 LAB — URINALYSIS, ROUTINE W REFLEX MICROSCOPIC
Glucose, UA: NEGATIVE mg/dL
Hgb urine dipstick: NEGATIVE
Ketones, ur: 15 mg/dL — AB
Protein, ur: NEGATIVE mg/dL
pH: 7.5 (ref 5.0–8.0)

## 2011-10-15 LAB — CREATININE, SERUM: GFR calc Af Amer: >90 mL/min (ref >90)

## 2011-10-15 LAB — POCT I-STAT TROPONIN I: Troponin i, poc: 0 ng/mL (ref 0.00–0.08)

## 2011-10-15 LAB — LIPASE, BLOOD: Lipase: 18 U/L (ref 11–59)

## 2011-10-15 MED ORDER — POTASSIUM CHLORIDE CRYS ER 20 MEQ PO TBCR
40.0000 meq | EXTENDED_RELEASE_TABLET | Freq: Every day | ORAL | Status: AC
  Administered 2011-10-15: 40 meq via ORAL

## 2011-10-15 MED ORDER — POTASSIUM CHLORIDE CRYS ER 20 MEQ PO TBCR
40.0000 meq | EXTENDED_RELEASE_TABLET | Freq: Once | ORAL | Status: AC
  Administered 2011-10-15: 40 meq via ORAL
  Filled 2011-10-15: qty 2

## 2011-10-15 MED ORDER — SODIUM CHLORIDE 0.9 % IV BOLUS (SEPSIS)
500.0000 mL | Freq: Once | INTRAVENOUS | Status: AC
  Administered 2011-10-15: 500 mL via INTRAVENOUS

## 2011-10-15 MED ORDER — OLMESARTAN MEDOXOMIL 40 MG PO TABS
40.0000 mg | ORAL_TABLET | Freq: Every day | ORAL | Status: DC
  Administered 2011-10-16 – 2011-10-18 (×3): 40 mg via ORAL
  Filled 2011-10-15 (×4): qty 1

## 2011-10-15 MED ORDER — MORPHINE SULFATE 4 MG/ML IJ SOLN
4.0000 mg | Freq: Once | INTRAMUSCULAR | Status: AC
  Administered 2011-10-15: 4 mg via INTRAVENOUS
  Filled 2011-10-15: qty 1

## 2011-10-15 MED ORDER — AMLODIPINE BESYLATE 10 MG PO TABS
10.0000 mg | ORAL_TABLET | Freq: Every day | ORAL | Status: DC
  Administered 2011-10-16 – 2011-10-18 (×3): 10 mg via ORAL
  Filled 2011-10-15 (×4): qty 1

## 2011-10-15 MED ORDER — PANTOPRAZOLE SODIUM 40 MG IV SOLR
40.0000 mg | Freq: Once | INTRAVENOUS | Status: AC
  Administered 2011-10-15: 40 mg via INTRAVENOUS
  Filled 2011-10-15: qty 40

## 2011-10-15 MED ORDER — SODIUM CHLORIDE 0.9 % IV SOLN: INTRAVENOUS | Status: DC

## 2011-10-15 MED ORDER — PANTOPRAZOLE SODIUM 40 MG PO TBEC
40.0000 mg | DELAYED_RELEASE_TABLET | Freq: Two times a day (BID) | ORAL | Status: DC
  Administered 2011-10-16 – 2011-10-18 (×5): 40 mg via ORAL
  Filled 2011-10-15 (×5): qty 1

## 2011-10-15 MED ORDER — MAGNESIUM OXIDE 400 MG PO TABS
400.0000 mg | ORAL_TABLET | Freq: Every day | ORAL | Status: DC
  Administered 2011-10-15 – 2011-10-18 (×4): 400 mg via ORAL
  Filled 2011-10-15 (×4): qty 1

## 2011-10-15 MED ORDER — VITAMIN B-1 100 MG PO TABS
100.0000 mg | ORAL_TABLET | Freq: Every day | ORAL | Status: DC
  Administered 2011-10-16 – 2011-10-18 (×3): 100 mg via ORAL
  Filled 2011-10-15 (×3): qty 1

## 2011-10-15 MED ORDER — THIAMINE HCL 100 MG/ML IJ SOLN
Freq: Once | INTRAVENOUS | Status: AC
  Administered 2011-10-15: 21:00:00 via INTRAVENOUS
  Filled 2011-10-15: qty 1000

## 2011-10-15 MED ORDER — ONDANSETRON HCL 4 MG/2ML IJ SOLN
4.0000 mg | Freq: Once | INTRAMUSCULAR | Status: AC
  Administered 2011-10-15: 4 mg via INTRAVENOUS
  Filled 2011-10-15: qty 2

## 2011-10-15 MED ORDER — ONDANSETRON HCL 4 MG/2ML IJ SOLN: 4.0000 mg | Freq: Four times a day (QID) | INTRAMUSCULAR | Status: DC | PRN

## 2011-10-15 MED ORDER — ONDANSETRON HCL 4 MG PO TABS: 4.0000 mg | ORAL_TABLET | Freq: Four times a day (QID) | ORAL | Status: DC | PRN

## 2011-10-15 MED ORDER — TRAMADOL HCL 50 MG PO TABS
50.0000 mg | ORAL_TABLET | Freq: Four times a day (QID) | ORAL | Status: DC | PRN
  Administered 2011-10-15 – 2011-10-17 (×4): 50 mg via ORAL
  Filled 2011-10-15 (×4): qty 1

## 2011-10-15 MED ORDER — ATENOLOL 100 MG PO TABS
100.0000 mg | ORAL_TABLET | Freq: Every day | ORAL | Status: DC
  Administered 2011-10-15 – 2011-10-18 (×4): 100 mg via ORAL
  Filled 2011-10-15 (×4): qty 1

## 2011-10-15 MED ORDER — MORPHINE SULFATE 2 MG/ML IJ SOLN
1.0000 mg | INTRAMUSCULAR | Status: DC | PRN
  Administered 2011-10-15 – 2011-10-16 (×2): 1 mg via INTRAVENOUS
  Filled 2011-10-15 (×2): qty 1

## 2011-10-15 MED ORDER — IOHEXOL 300 MG/ML  SOLN
85.0000 mL | Freq: Once | INTRAMUSCULAR | Status: AC | PRN
  Administered 2011-10-15: 85 mL via INTRAVENOUS

## 2011-10-15 MED ORDER — ACETAMINOPHEN 650 MG RE SUPP: 650.0000 mg | Freq: Four times a day (QID) | RECTAL | Status: DC | PRN

## 2011-10-15 MED ORDER — ENOXAPARIN SODIUM 40 MG/0.4ML ~~LOC~~ SOLN
40.0000 mg | SUBCUTANEOUS | Status: DC
  Administered 2011-10-15 – 2011-10-17 (×3): 40 mg via SUBCUTANEOUS
  Filled 2011-10-15 (×4): qty 0.4

## 2011-10-15 MED ORDER — LORAZEPAM 0.5 MG PO TABS: 0.5000 mg | ORAL_TABLET | ORAL | Status: DC | PRN

## 2011-10-15 MED ORDER — IOHEXOL 350 MG/ML SOLN
80.0000 mL | Freq: Once | INTRAVENOUS | Status: AC | PRN
  Administered 2011-10-15: 80 mL via INTRAVENOUS

## 2011-10-15 MED ORDER — ACETAMINOPHEN 325 MG PO TABS: 650.0000 mg | ORAL_TABLET | Freq: Four times a day (QID) | ORAL | Status: DC | PRN

## 2011-10-15 MED ORDER — ALUM & MAG HYDROXIDE-SIMETH 200-200-20 MG/5ML PO SUSP
30.0000 mL | Freq: Four times a day (QID) | ORAL | Status: DC | PRN
  Administered 2011-10-16: 30 mL via ORAL
  Filled 2011-10-15: qty 30

## 2011-10-15 NOTE — ED Notes (Signed)
Patient is resting comfortably. Pain improved.

## 2011-10-15 NOTE — ED Notes (Signed)
To ultrasound

## 2011-10-15 NOTE — ED Provider Notes (Addendum)
History     CSN: [id number] Arrival date & time: 10/15/2011  7:41 AM   First MD Initiated Contact with Patient 10/15/11 [phone]      Chief Complaint  Patient presents with  . Abdominal Pain  . Chest Pain    (Consider location/radiation/quality/duration/timing/severity/associated sxs/prior treatment) Patient is a 69 y.o. male presenting with abdominal pain and chest pain. The history is provided by the patient and the spouse.  Abdominal Pain The primary symptoms of the illness include abdominal pain. The primary symptoms of the illness do not include fever, shortness of breath or dysuria. The current episode started 6 to 12 hours ago. The onset of the illness was sudden. The problem has not changed since onset. Symptoms associated with the illness do not include back pain.  Chest Pain Primary symptoms include abdominal pain. Pertinent negatives for primary symptoms include no fever and no shortness of breath.   pt c/o epigastric pain onset approx 7 hours ago while resting, had not yet gone to sleep. Constant, dull, non radiating. No back or chest pain. Nausea. Vomiting x 1 clear. Having normal bms, no diarrhea or constipation. Denies hx same pain. No hx pud, gallstones, or pancreatitis. No prior abd surgery. No chest pain or sob. No specific exacerbating or allev factors.   Past Medical History  Diagnosis Date  . Hypertension     History reviewed. No pertinent past surgical history.  History reviewed. No pertinent family history.  History  Substance Use Topics  . Smoking status: Not on file  . Smokeless tobacco: Not on file  . Alcohol Use:       Review of Systems  Constitutional: Negative for fever.  HENT: Negative for neck pain.   Eyes: Negative for pain.  Respiratory: Negative for shortness of breath.   Cardiovascular: Negative for chest pain.  Gastrointestinal: Positive for abdominal pain.  Genitourinary: Negative for dysuria and flank pain.  Musculoskeletal:  Negative for back pain.  Skin: Negative for rash.  Neurological: Negative for headaches.  Hematological: Does not bruise/bleed easily.  Psychiatric/Behavioral: Negative for confusion.    Allergies  Review of patient's allergies indicates no known allergies.  Home Medications   Current Outpatient Rx  Name Route Sig Dispense Refill  . ALENDRONATE SODIUM 70 MG PO TABS Oral Take 70 mg by mouth every 7 (seven) days. Take with a full glass of water on an empty stomach.  Hold while in hospital     . AMLODIPINE BESYLATE 10 MG PO TABS Oral Take 10 mg by mouth daily.      . ATENOLOL 100 MG PO TABS Oral Take 100 mg by mouth daily.      [name] OLMESARTAN MEDOXOMIL 40 MG PO TABS Oral Take 40 mg by mouth daily.      [name] OMEPRAZOLE 20 MG PO CPDR Oral Take 20 mg by mouth 2 (two) times daily.      . TRAMADOL HCL 50 MG PO TABS Oral Take 50 mg by mouth every 6 (six) hours as needed. Maximum dose= 8 tablets per day   "for pain"      BP 153/71  Pulse 87  Temp(Src) 98.1 F (36.7 C) (Oral)  Resp 22  SpO2 100%  Physical Exam  Nursing note and vitals reviewed. Constitutional: He is oriented to person, place, and time. He appears well-developed and well-nourished.  HENT:  Head: Atraumatic.  Eyes: Pupils are equal, round, and reactive to light.  Neck: Neck supple. No tracheal deviation present.  Cardiovascular: Normal rate, normal  heart sounds and intact distal pulses.  Exam reveals no gallop and no friction rub.   No murmur heard. Pulmonary/Chest: Effort normal. No accessory muscle usage. No respiratory distress. He has no rales.  Abdominal: Soft. Bowel sounds are normal. He exhibits no distension and no mass. There is tenderness. There is no rebound and no guarding.       Epigastric tenderness. No rebound or guarding. No puls mass.   Genitourinary:       No cva tenderness  Musculoskeletal: Normal range of motion. He exhibits no edema and no tenderness.  Neurological: He is alert and oriented to person,  place, and time.  Skin: Skin is warm and dry.  Psychiatric: He has a normal mood and affect.    ED Course  Procedures (including critical care time)  Labs Reviewed  CBC - Abnormal; Notable for the following:    RBC 4.14 (*)    MCH 35.5 (*)    All other components within normal limits  COMPREHENSIVE METABOLIC PANEL - Abnormal; Notable for the following:    Sodium 121 (*)    Potassium 3.2 (*)    Chloride 84 (*)    Glucose, Bld 146 (*)    Total Bilirubin 1.5 (*)    All other components within normal limits  URINALYSIS, ROUTINE W REFLEX MICROSCOPIC - Abnormal; Notable for the following:    Ketones, ur 15 (*)    All other components within normal limits  LIPASE, BLOOD  PROTIME-INR  POCT I-STAT TROPONIN I  I-STAT TROPONIN I   US Abdomen Complete  10/15/2011  *RADIOLOGY REPORT*  Clinical Data:  Abdominal pain, concern for cholecystitis of the upper and through the roof and  COMPLETE ABDOMINAL ULTRASOUND  Comparison:  The CT 10/15/2011  Findings:  Gallbladder:  There is several small echogenic foci in the gallbladder gallbladder which do not to demonstrate posterior shadowing and mobile.  The gallbladder wall is not thickened.  No pericholecystic fluid. Negative sonographic Murphy's sign.  Common bile duct:  Normal at 3 mm.  Liver:  No focal lesion identified.  Within normal limits in parenchymal echogenicity.  IVC:  Appears normal.  Pancreas:  No focal abnormality seen.  Spleen:  Normal size and echogenicity.  The  Right Kidney:  11.8cm in length.  No evidence of hydronephrosis or stones.  Left Kidney:  11.5cm in length.  No evidence of hydronephrosis or stones.  Abdominal aorta:  No aneurysm identified.  IMPRESSION:  1.  No evidence of cholecystitis. 2.  Small echogenic foci within the gallbladder represent either small polyps  or small adherent cholesterol stones.  Original Report Authenticated By: Genevive Bi, M.D.   Ct Abdomen Pelvis W Contrast  10/15/2011  *RADIOLOGY REPORT*   Clinical Data: Abdominal pain.  Nausea vomiting and diarrhea.  CT ABDOMEN AND PELVIS WITH CONTRAST  Technique:  Multidetector CT imaging of the abdomen and pelvis was performed following the standard protocol during bolus administration of intravenous contrast.  Contrast: 85mL OMNIPAQUE IOHEXOL 300 MG/ML IV SOLN  Comparison: 09/08/2010  Findings:  Emphysema noted within the lung bases as well as scarring.  No pericardial or pleural effusion.  Within the right hepatic lobe adjacent to the gallbladder fossa there is a small hypoechoic structure measuring 0.5 cm, image 28. Stones noted within the dependent portion of the gallbladder which measure up to 4 mm.  No gallbladder wall thickening or pericholecystic fluid.  The common bile duct has a normal caliber.  Normal appearance of the pancreas.  Spleen is  negative.  The right adrenal gland is normal.  Low density nodule within the left adrenal gland is stable measuring 1.2 cm, image 30.  No upper abdominal adenopathy identified.  Normal appearance of the right kidney.  The left kidney is normal. There are no enlarged pelvic or inguinal lymph nodes.  Urinary bladder appears within normal limits.  No free fluid or fluid collections within the abdomen or pelvis. The stomach appears normal.  The small bowel loops are unremarkable.  The proximal colon is negative.  Multiple colonic diverticula are noted within the sigmoid colon. No active inflammation.  Review of the visualized osseous structures is significant for scoliosis and multilevel degenerative disc disease.  IMPRESSION:  1.  No acute findings within the abdomen or the pelvis. 2.  Sigmoid diverticulosis without acute inflammation. 3.  Gallstones.  4.  Left adrenal nodule is stable from previous exam.  Original Report Authenticated By: Rosealee Albee, M.D.    Results for orders placed during the hospital encounter of 10/15/11  CBC      Component Value Range   WBC 7.3  4.0 - 10.5 (K/uL)   RBC 4.14 (*) 4.22 -  5.81 (MIL/uL)   Hemoglobin 14.7  13.0 - 17.0 (g/dL)   HCT 40.1  02.7 - 25.3 (%)   MCV 99.0  78.0 - 100.0 (fL)   MCH 35.5 (*) 26.0 - 34.0 (pg)   MCHC 35.9  30.0 - 36.0 (g/dL)   RDW 66.4  40.3 - 47.4 (%)   Platelets 191  150 - 400 (K/uL)  COMPREHENSIVE METABOLIC PANEL      Component Value Range   Sodium 121 (*) 135 - 145 (mEq/L)   Potassium 3.2 (*) 3.5 - 5.1 (mEq/L)   Chloride 84 (*) 96 - 112 (mEq/L)   CO2 20  19 - 32 (mEq/L)   Glucose, Bld 146 (*) 70 - 99 (mg/dL)   BUN 9  6 - 23 (mg/dL)   Creatinine, Ser 2.59  0.50 - 1.35 (mg/dL)   Calcium 9.8  8.4 - 56.3 (mg/dL)   Total Protein 6.9  6.0 - 8.3 (g/dL)   Albumin 4.0  3.5 - 5.2 (g/dL)   AST 32  0 - 37 (U/L)   ALT 22  0 - 53 (U/L)   Alkaline Phosphatase 108  39 - 117 (U/L)   Total Bilirubin 1.5 (*) 0.3 - 1.2 (mg/dL)   GFR calc non Af Amer >90  >90 (mL/min)   GFR calc Af Amer >90  >90 (mL/min)  LIPASE, BLOOD      Component Value Range   Lipase 18  11 - 59 (U/L)  PROTIME-INR      Component Value Range   Prothrombin Time 12.7  11.6 - 15.2 (seconds)   INR 0.93  0.00 - 1.49   URINALYSIS, ROUTINE W REFLEX MICROSCOPIC      Component Value Range   Color, Urine YELLOW  YELLOW    Appearance CLEAR  CLEAR    Specific Gravity, Urine 1.014  1.005 - 1.030    pH 7.5  5.0 - 8.0    Glucose, UA NEGATIVE  NEGATIVE (mg/dL)   Hgb urine dipstick NEGATIVE  NEGATIVE    Bilirubin Urine NEGATIVE  NEGATIVE    Ketones, ur 15 (*) NEGATIVE (mg/dL)   Protein, ur NEGATIVE  NEGATIVE (mg/dL)   Urobilinogen, UA 1.0  0.0 - 1.0 (mg/dL)   Nitrite NEGATIVE  NEGATIVE    Leukocytes, UA NEGATIVE  NEGATIVE   POCT I-STAT TROPONIN I  Component Value Range   Troponin i, poc 0.00  0.00 - 0.08 (ng/mL)   Comment 3            No results found.   No diagnosis found.    MDM  Nurses notes reviewed. Reviewed old charts, labs, xrays. Pt denies hx malignancy, states prior workup for same but states no specific dx.    Date: 10/15/2011  Rate: 88  Rhythm: normal  sinus rhythm  QRS Axis: right  Intervals: normal  ST/T Wave abnormalities: nonspecific ST changes  Conduction Disutrbances:none  Narrative Interpretation:   Old EKG Reviewed: unchanged  Additional iv ns bolus given. Na noted low. Pain improved but persists, no change in abd exam, ct pending. Morphine 4 mg iv.     With morphine relief of pain. Recheck abd soft nt. No cholecystitis per u/s. Given recurrent pain, need for pain/nausea control, ivf, signif hyponatremia, med service called for admit. Suspect abd pain due to gallstones.   Discussed w [facility], will admit.   Suzi Roots, MD 10/15/11 1449  Suzi Roots, MD 10/15/11 [phone]

## 2011-10-15 NOTE — ED Notes (Signed)
Gave two blankets to the pt and helped him up in bed so he wouldn't be hanging off.8:41 AM JG

## 2011-10-15 NOTE — ED Notes (Signed)
Pt from home with c/o upper abdominal pain into epigastric area. Pt given 324 mg of ASA and 1 nitro. Pt pain decreased from 9/10 to 8/10.

## 2011-10-15 NOTE — H&P (Signed)
Anthony Curry MRN: [id number] DOB/AGE: [birth year] 70 y.o. Primary Care Physician:No primary provider on file. Admit date: 10/15/2011 Chief Complaint: Abdominal pain HPI: This is a 70 year old male with end to the ER with a chief complaint of abdominal pain radiating into his chest. The patient has not been feeling well since yesterday. The patient yesterday woke up yesterday early in the morning with excruciating abdominal. pain radiating into his back and into his chest. Associated with some nausea. He wanted his wife to call 911 last night but then changed his mind. He came in today for the unrelenting pain lasting 6-12 hours. The patient also had some nausea vomiting in the ER. He denies any fever chills or rigors. He denies any diarrhea or constipation he denies any hematemesis. He admits to drinking up to 4-6 beers on daily basis. He states that he does not withdraw if he does not drink alcohol. He denies any chest pain any shortness of breath he denies any recent history of travel denies any sick contacts.  Past Medical History  Diagnosis Date  . Hypertension    gastroesophageal reflux disease Osteoporosis Alcohol abuse History of hyponatremia  Past surgical history #1 history of hemorrhoidal surgery #2 history of right ankle surgery     Prior to Admission medications   Medication Sig Start Date End Date Taking? Authorizing Provider  alendronate (FOSAMAX) 70 MG tablet Take 70 mg by mouth every 7 (seven) days. Take with a full glass of water on an empty stomach.  Hold while in hospital    Yes Historical Provider, MD  amLODipine (NORVASC) 10 MG tablet Take 10 mg by mouth daily.     Yes Historical Provider, MD  atenolol (TENORMIN) 100 MG tablet Take 100 mg by mouth daily.     Yes Historical Provider, MD  olmesartan (BENICAR) 40 MG tablet Take 40 mg by mouth daily.     Yes Historical Provider, MD  omeprazole (PRILOSEC) 20 MG capsule Take 20 mg by mouth 2 (two) times daily.     Yes  Historical Provider, MD  traMADol (ULTRAM) 50 MG tablet Take 50 mg by mouth every 6 (six) hours as needed. Maximum dose= 8 tablets per day   "for pain"    Historical Provider, MD    Allergies: No Known Allergies   Family history #1 brother had myocardial infarction x2 when he was in the 39s and died of a brain tumor, he also had COPD #2 father had hypertension and died of coronary artery disease #3 sister. has hypertension #4 mother had coronary artery disease and lung cancer  Social History:  Quit smoking 25 years ago, smoked for 30 years 1 pack per day history of smoking, lives with his wife, but used to work [employer], drinks about 4-6 beers on a regular basis  ROS: A complete 14 point review of systems was done and pertinent positives are listed in history of present illness.  PHYSICAL EXAM: Blood pressure 146/80, pulse 92, temperature 98.9 F (37.2 C), temperature source Oral, resp. rate 18, SpO2 98.00%. Constitutional: He is oriented to person, place, and time. He appears well-developed and well-nourished.  HENT:  Head: Atraumatic.  Eyes: Pupils are equal, round, and reactive to light.  Neck: Neck supple. No tracheal deviation present.  Cardiovascular: Normal rate, normal heart sounds and intact distal pulses. Exam reveals no gallop and no friction rub.  No murmur heard.  Pulmonary/Chest: Effort normal. No accessory muscle usage. No respiratory distress. He has no rales.  Abdominal: Soft.  Bowel sounds are normal. He exhibits no distension and no mass. There is epigastric tenderness. There is no rebound and no guarding.  Epigastric tenderness. No rebound or guarding. No puls mass.  Genitourinary:  No cva tenderness  Musculoskeletal: Normal range of motion. He exhibits no edema and no tenderness.  Neurological: He is alert and oriented to person, place, and time. No asterixis Skin: Skin is warm and dry.  Psychiatric: He has a normal mood and affect.      Results for  orders placed during the hospital encounter of 10/15/11 (from the past 48 hour(s))  CBC     Status: Abnormal   Collection Time   10/15/11  8:20 AM      Component Value Range Comment   WBC 7.3  4.0 - 10.5 (K/uL)    RBC 4.14 (*) 4.22 - 5.81 (MIL/uL)    Hemoglobin 14.7  13.0 - 17.0 (g/dL)    HCT 11.9  14.7 - 82.9 (%)    MCV 99.0  78.0 - 100.0 (fL)    MCH 35.5 (*) 26.0 - 34.0 (pg)    MCHC 35.9  30.0 - 36.0 (g/dL)    RDW 56.2  13.0 - 86.5 (%)    Platelets 191  150 - 400 (K/uL)   COMPREHENSIVE METABOLIC PANEL     Status: Abnormal   Collection Time   10/15/11  8:20 AM      Component Value Range Comment   Sodium 121 (*) 135 - 145 (mEq/L)    Potassium 3.2 (*) 3.5 - 5.1 (mEq/L)    Chloride 84 (*) 96 - 112 (mEq/L)    CO2 20  19 - 32 (mEq/L)    Glucose, Bld 146 (*) 70 - 99 (mg/dL)    BUN 9  6 - 23 (mg/dL)    Creatinine, Ser 7.84  0.50 - 1.35 (mg/dL)    Calcium 9.8  8.4 - 10.5 (mg/dL)    Total Protein 6.9  6.0 - 8.3 (g/dL)    Albumin 4.0  3.5 - 5.2 (g/dL)    AST 32  0 - 37 (U/L)    ALT 22  0 - 53 (U/L)    Alkaline Phosphatase 108  39 - 117 (U/L)    Total Bilirubin 1.5 (*) 0.3 - 1.2 (mg/dL)    GFR calc non Af Amer >90  >90 (mL/min)    GFR calc Af Amer >90  >90 (mL/min)   LIPASE, BLOOD     Status: Normal   Collection Time   10/15/11  8:20 AM      Component Value Range Comment   Lipase 18  11 - 59 (U/L)   PROTIME-INR     Status: Normal   Collection Time   10/15/11  8:20 AM      Component Value Range Comment   Prothrombin Time 12.7  11.6 - 15.2 (seconds)    INR 0.93  0.00 - 1.49    POCT I-STAT TROPONIN I     Status: Normal   Collection Time   10/15/11  8:42 AM      Component Value Range Comment   Troponin i, poc 0.00  0.00 - 0.08 (ng/mL)    Comment 3            URINALYSIS, ROUTINE W REFLEX MICROSCOPIC     Status: Abnormal   Collection Time   10/15/11  9:35 AM      Component Value Range Comment   Color, Urine YELLOW  YELLOW     Appearance  CLEAR  CLEAR     Specific Gravity,  Urine 1.014  1.005 - 1.030     pH 7.5  5.0 - 8.0     Glucose, UA NEGATIVE  NEGATIVE (mg/dL)    Hgb urine dipstick NEGATIVE  NEGATIVE     Bilirubin Urine NEGATIVE  NEGATIVE     Ketones, ur 15 (*) NEGATIVE (mg/dL)    Protein, ur NEGATIVE  NEGATIVE (mg/dL)    Urobilinogen, UA 1.0  0.0 - 1.0 (mg/dL)    Nitrite NEGATIVE  NEGATIVE     Leukocytes, UA NEGATIVE  NEGATIVE  MICROSCOPIC NOT DONE ON URINES WITH NEGATIVE PROTEIN, BLOOD, LEUKOCYTES, NITRITE, OR GLUCOSE <1000 mg/dL.    US Abdomen Complete  10/15/2011  *RADIOLOGY REPORT*  Clinical Data:  Abdominal pain, concern for cholecystitis of the upper and through the roof and  COMPLETE ABDOMINAL ULTRASOUND  Comparison:  The CT 10/15/2011  Findings:  Gallbladder:  There is several small echogenic foci in the gallbladder gallbladder which do not to demonstrate posterior shadowing and mobile.  The gallbladder wall is not thickened.  No pericholecystic fluid. Negative sonographic Murphy's sign.  Common bile duct:  Normal at 3 mm.  Liver:  No focal lesion identified.  Within normal limits in parenchymal echogenicity.  IVC:  Appears normal.  Pancreas:  No focal abnormality seen.  Spleen:  Normal size and echogenicity.  The  Right Kidney:  11.8cm in length.  No evidence of hydronephrosis or stones.  Left Kidney:  11.5cm in length.  No evidence of hydronephrosis or stones.  Abdominal aorta:  No aneurysm identified.  IMPRESSION:  1.  No evidence of cholecystitis. 2.  Small echogenic foci within the gallbladder represent either small polyps  or small adherent cholesterol stones.  Original Report Authenticated By: Genevive Bi, M.D.   Ct Abdomen Pelvis W Contrast  10/15/2011  *RADIOLOGY REPORT*  Clinical Data: Abdominal pain.  Nausea vomiting and diarrhea.  CT ABDOMEN AND PELVIS WITH CONTRAST  Technique:  Multidetector CT imaging of the abdomen and pelvis was performed following the standard protocol during bolus administration of intravenous contrast.  Contrast:  85mL OMNIPAQUE IOHEXOL 300 MG/ML IV SOLN  Comparison: 09/08/2010  Findings:  Emphysema noted within the lung bases as well as scarring.  No pericardial or pleural effusion.  Within the right hepatic lobe adjacent to the gallbladder fossa there is a small hypoechoic structure measuring 0.5 cm, image 28. Stones noted within the dependent portion of the gallbladder which measure up to 4 mm.  No gallbladder wall thickening or pericholecystic fluid.  The common bile duct has a normal caliber.  Normal appearance of the pancreas.  Spleen is negative.  The right adrenal gland is normal.  Low density nodule within the left adrenal gland is stable measuring 1.2 cm, image 30.  No upper abdominal adenopathy identified.  Normal appearance of the right kidney.  The left kidney is normal. There are no enlarged pelvic or inguinal lymph nodes.  Urinary bladder appears within normal limits.  No free fluid or fluid collections within the abdomen or pelvis. The stomach appears normal.  The small bowel loops are unremarkable.  The proximal colon is negative.  Multiple colonic diverticula are noted within the sigmoid colon. No active inflammation.  Review of the visualized osseous structures is significant for scoliosis and multilevel degenerative disc disease.  IMPRESSION:  1.  No acute findings within the abdomen or the pelvis. 2.  Sigmoid diverticulosis without acute inflammation. 3.  Gallstones.  4.  Left adrenal  nodule is stable from previous exam.  Original Report Authenticated By: Rosealee Albee, M.D.    Impression:  Principal Problem:  Abdominal colic Active Problems:  HTN (hypertension)  Nausea & vomiting  Alcohol abuse  Hyponatremia  Hypokalemia     Plan:  Given his history of hypertension and long-standing history of smoking, I feel it is vital to rule out dissection of the aorta. The patient will be admitted and we will do a CT of chest abdomen pelvis per dissection protocol. The patient does not have any  evidence of cholecystitis/diverticulitis/pancreatitis, or any other intra-abdominal pathology to explain his abdominal pain.  It is possible that the patient has gastritis related to alcohol use and bisphosphonate use. Therefore he will be continued on a PPI. The patient has hyponatremia and hypokalemia, probably related to his alcohol use. We will hydrate him with IV fluids, to see if her sodium corrects. He lost a replete his potassium and check a magnesium level.  For his alcohol abuse we will monitor him on CIWA protocol.      Anthony Curry 10/15/2011, 5:29 PM

## 2011-10-15 NOTE — ED Notes (Signed)
Patient is resting comfortably. Awaiting admission.

## 2011-10-16 ENCOUNTER — Other Ambulatory Visit: Payer: Self-pay

## 2011-10-16 DIAGNOSIS — K8020 Calculus of gallbladder without cholecystitis without obstruction: Secondary | ICD-10-CM

## 2011-10-16 LAB — COMPREHENSIVE METABOLIC PANEL
AST: 22 U/L (ref 0–37)
CO2: 21 mEq/L (ref 19–32)
Chloride: 92 mEq/L — ABNORMAL LOW (ref 96–112)
Creatinine, Ser: 0.68 mg/dL (ref 0.50–1.35)
GFR calc Af Amer: >90 mL/min (ref >90)
GFR calc non Af Amer: >90 mL/min (ref >90)
Glucose, Bld: 102 mg/dL — ABNORMAL HIGH (ref 70–99)
Total Bilirubin: 1.6 mg/dL — ABNORMAL HIGH (ref 0.3–1.2)

## 2011-10-16 LAB — PROTIME-INR: INR: 1.05 (ref 0.00–1.49)

## 2011-10-16 MED ORDER — MORPHINE SULFATE 2 MG/ML IJ SOLN
2.0000 mg | INTRAMUSCULAR | Status: DC | PRN
  Administered 2011-10-16 – 2011-10-17 (×4): 2 mg via INTRAVENOUS
  Filled 2011-10-16 (×4): qty 1

## 2011-10-16 MED ORDER — POTASSIUM CHLORIDE IN NACL 40-0.9 MEQ/L-% IV SOLN
INTRAVENOUS | Status: DC
  Administered 2011-10-16 (×2): via INTRAVENOUS
  Administered 2011-10-17: 100 mL/h via INTRAVENOUS
  Administered 2011-10-17 – 2011-10-18 (×2): via INTRAVENOUS
  Filled 2011-10-16 (×6): qty 1000

## 2011-10-16 NOTE — Consult Note (Signed)
Subjective:    Anthony Curry is a 70 y.o. male who presents for evaluation of epigastric abdominal pain. The pain is described as severe stabbing pain which woke him up at 2:30 in the morning. It did not resolve and he was brought by his wife to the emergency department for evaluation. It did improve after he was seen in the ED and given some pain pills by injection. At the present time he is relatively pain-free he has not had any pain medication for several hours.  After being seen and admitted he had a CT scan which was basically negative with the exception of a somewhat dilated gallbladder. This was followed up by an ultrasound which showed either some small adherent gallstones or some cholesterol polyps. There was nothing to suggest acute cholecystitis.  Surgical evaluation was requested because of the finding of gallstones and to evaluate whether that might have been the source of the patient's severe pain. He does note that he has had a long history of reflux type symptoms although this pain was much worse than his typical reflux. As noted it was basically subxiphoid. Then radiated up into the posterior chest area.  Review of Systems A comprehensive review of systems was negative.  Other than as noted in his admission note Objective:   BP 135/67  Pulse 64  Temp(Src) 98.2 F (36.8 C) (Oral)  Resp 18  Ht 6' (1.829 m)  Wt 161 lb 6 oz (73.2 kg)  BMI 21.89 kg/m2  SpO2 100%  General:  alert, cooperative and appears stated age Skin:  normal   Lungs:  clear to auscultation bilaterally Heart:  regular rate and rhythm, S1, S2 normal, no murmur, click, rub or gallop Abdomen: soft, non-tender; bowel sounds normal; no masses,  no organomegaly  Data Reviewed:I have reviewed all of his notes in his electronic medical record and reviewed the findings of his ultrasound and CT scan as well as laboratory studies. Extremities:  extremities normal, atraumatic, no cyanosis or edema Neurologic:    negative Psychiatric:  normal mood, behavior, speech, dress, and thought processes    Assessment:episode of severe epigastric abdominal pain of uncertain etiology. Probable small gallstones uncertain whether this was the cause of his abdominal pain. Long history of reflux disease on omeprazole.    Plan:I recommended that he have a hepatobiliary scan with emptying study to see if he has some biliary dyskinesia or evidence for acute cholecystitis. A case could be made for doing a cholecystectomy given his history of waking up at 2:30 in the morning with severe pain and the suggestion is he has biliary tract disease by ultrasound. 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  We will evaluate him again after his HIDA scan and make final recommendations.

## 2011-10-16 NOTE — Progress Notes (Signed)
Subjective: Complaining of epigastric and periumbilical pain today associated with some mild nausea.  Objective: Vital signs in last 24 hours: Filed Vitals:   10/15/11 1812 10/15/11 2100 10/15/11 2352 10/16/11 0500  BP: 119/57 120/67 132/74 140/69  Pulse: 81 77 61 64  Temp:  98.1 F (36.7 C)  98.2 F (36.8 C)  TempSrc:  Oral  Oral  Resp:  18  18  Height:  6' (1.829 m)    Weight:  72.576 kg (160 lb)  73.2 kg (161 lb 6 oz)  SpO2: 94% 100%  100%    Intake/Output Summary (Last 24 hours) at 10/16/11 0942 Last data filed at 10/15/11 2300  Gross per 24 hour  Intake  397.5 ml  Output    400 ml  Net   -2.5 ml    Weight change:   General: Alert, awake, oriented x3, in no acute distress. HEENT: No bruits, no goiter. Heart: Regular rate and rhythm, without murmurs, rubs, gallops. Lungs: Clear to auscultation bilaterally. Abdomen: Soft,  tender to palpation in the very unlikely right upper quadrant area, nondistended, positive bowel sounds. Extremities: No clubbing cyanosis or edema with positive pedal pulses. Neuro: Grossly intact, nonfocal.    Lab Results: Results for orders placed during the hospital encounter of 10/15/11 (from the past 24 hour(s))  CBC     Status: Abnormal   Collection Time   10/15/11  5:40 PM      Component Value Range   WBC 9.3  4.0 - 10.5 (K/uL)   RBC 4.33  4.22 - 5.81 (MIL/uL)   Hemoglobin 16.2  13.0 - 17.0 (g/dL)   HCT 16.1  09.6 - 04.5 (%)   MCV 99.1  78.0 - 100.0 (fL)   MCH 37.4 (*) 26.0 - 34.0 (pg)   MCHC 37.8 (*) 30.0 - 36.0 (g/dL)   RDW 40.9  81.1 - 91.4 (%)   Platelets 178  150 - 400 (K/uL)  CREATININE, SERUM     Status: Normal   Collection Time   10/15/11  5:40 PM      Component Value Range   Creatinine, Ser 0.51  0.50 - 1.35 (mg/dL)   GFR calc non Af Amer >90  >90 (mL/min)   GFR calc Af Amer >90  >90 (mL/min)  CALCIUM     Status: Normal   Collection Time   10/15/11  5:40 PM      Component Value Range   Calcium 9.1  8.4 - 10.5  (mg/dL)  MAGNESIUM     Status: Normal   Collection Time   10/15/11  5:40 PM      Component Value Range   Magnesium 1.5  1.5 - 2.5 (mg/dL)  PHOSPHORUS     Status: Normal   Collection Time   10/15/11  5:40 PM      Component Value Range   Phosphorus 3.8  2.3 - 4.6 (mg/dL)  TSH     Status: Normal   Collection Time   10/15/11  5:40 PM      Component Value Range   TSH 1.551  0.350 - 4.500 (uIU/mL)  COMPREHENSIVE METABOLIC PANEL     Status: Abnormal   Collection Time   10/16/11  7:00 AM      Component Value Range   Sodium 125 (*) 135 - 145 (mEq/L)   Potassium 3.8  3.5 - 5.1 (mEq/L)   Chloride 92 (*) 96 - 112 (mEq/L)   CO2 21  19 - 32 (mEq/L)   Glucose, Bld 102 (*)  70 - 99 (mg/dL)   BUN 9  6 - 23 (mg/dL)   Creatinine, Ser 1.61  0.50 - 1.35 (mg/dL)   Calcium 8.9  8.4 - 09.6 (mg/dL)   Total Protein 6.3  6.0 - 8.3 (g/dL)   Albumin 3.3 (*) 3.5 - 5.2 (g/dL)   AST 22  0 - 37 (U/L)   ALT 17  0 - 53 (U/L)   Alkaline Phosphatase 87  39 - 117 (U/L)   Total Bilirubin 1.6 (*) 0.3 - 1.2 (mg/dL)   GFR calc non Af Amer >90  >90 (mL/min)   GFR calc Af Amer >90  >90 (mL/min)  PROTIME-INR     Status: Normal   Collection Time   10/16/11  7:00 AM      Component Value Range   Prothrombin Time 13.9  11.6 - 15.2 (seconds)   INR 1.05  0.00 - 1.49      Micro: No results found for this or any previous visit (from the past 240 hour(s)).  Studies/Results: Ct Angio Chest W/cm &/or Wo Cm  10/15/2011  *RADIOLOGY REPORT*   IMPRESSION:  1.  No CT findings for aortic dissection.  Scattered atherosclerotic changes but no aneurysm. 2.  Coronary artery calcifications. 3.  Bibasilar subsegmental atelectasis. 4.  Persistent upper lobe ground-glass opacities bilaterally. Recommend continued surveillance. 5.  Distended gallbladder.  Original Report Authenticated By: P. Loralie Champagne, M.D.   US Abdomen Complete  10/15/2011  *RADIOLOGY REPORT*  Clinical Data:  Abdominal pain, concern for cholecystitis of the  upper and through the roof and  COMPLETE ABDOMINAL ULTRASOUND    IMPRESSION:  1.  No evidence of cholecystitis. 2.  Small echogenic foci within the gallbladder represent either small polyps  or small adherent cholesterol stones.  Original Report Authenticated By: Genevive Bi, M.D.   Ct Abdomen Pelvis W Contrast  10/15/2011   IMPRESSION:  1.  No acute findings within the abdomen or the pelvis. 2.  Sigmoid diverticulosis without acute inflammation. 3.  Gallstones.  4.  Left adrenal nodule is stable from previous exam.  Original Report Authenticated By: Rosealee Albee, M.D.    Medications:     . amLODipine  10 mg Oral Daily  . atenolol  100 mg Oral Daily  . enoxaparin  40 mg Subcutaneous Q24H  . magnesium oxide  400 mg Oral Daily  .  morphine injection  4 mg Intravenous Once  .  morphine injection  4 mg Intravenous Once  . olmesartan  40 mg Oral Daily  . ondansetron (ZOFRAN) IV  4 mg Intravenous Once  . pantoprazole  40 mg Oral BID AC  . potassium chloride  40 mEq Oral Once  . potassium chloride  40 mEq Oral Daily  . general admission iv infusion   Intravenous Once  . sodium chloride  500 mL Intravenous Once  . thiamine  100 mg Oral Daily     Assessment: Principal Problem:  *Abdominal colic Active Problems:  HTN (hypertension)  Nausea & vomiting  Alcohol abuse  Hyponatremia  Hypokalemia   Plan: #1 abdominal pain :Given his history of hypertension and long-standing history of smoking, CT and it was negative for aortic dissection . The patient does not have any evidence of cholecystitis/diverticulitis/pancreatitis, or any other intra-abdominal pathology to explain his abdominal pain. He does have gallstones and the symptoms could be consistent with biliary colic. [facility] consultation has been obtained. We will keep him n.p.o. until evaluated by surgery. It is possible that the patient has  gastritis related to alcohol use and bisphosphonate use. Therefore he will be continued on a  PPI.   #2 electrolyte abnormalities The patient has hyponatremia and hypokalemia, probably related to his alcohol use. We will hydrate him with IV fluids, to see if her sodium corrects. His potassium and magnesium are improved from yesterday   #3 For his alcohol abuse we will monitor him on CIWA protocol.       LOS: 1 day   [facility] 10/16/2011, 9:42 AM

## 2011-10-17 ENCOUNTER — Inpatient Hospital Stay (HOSPITAL_COMMUNITY): Payer: Medicare Other

## 2011-10-17 LAB — COMPREHENSIVE METABOLIC PANEL
ALT: 16 U/L (ref 0–53)
AST: 23 U/L (ref 0–37)
CO2: 26 mEq/L (ref 19–32)
Chloride: 94 mEq/L — ABNORMAL LOW (ref 96–112)
Creatinine, Ser: 0.65 mg/dL (ref 0.50–1.35)
GFR calc non Af Amer: >90 mL/min (ref >90)
Glucose, Bld: 82 mg/dL (ref 70–99)
Sodium: 126 mEq/L — ABNORMAL LOW (ref 135–145)
Total Bilirubin: 1.2 mg/dL (ref 0.3–1.2)

## 2011-10-17 MED ORDER — TECHNETIUM TC 99M MEBROFENIN IV KIT
5.0000 | PACK | Freq: Once | INTRAVENOUS | Status: AC | PRN
Start: 2011-10-17 — End: 2011-10-17
  Administered 2011-10-17: 5 via INTRAVENOUS

## 2011-10-17 MED ORDER — MORPHINE SULFATE 4 MG/ML IJ SOLN
3.0000 mg | Freq: Once | INTRAMUSCULAR | Status: AC
  Administered 2011-10-17: 3 mg via INTRAVENOUS

## 2011-10-17 NOTE — Progress Notes (Signed)
  Subjective: Pt without complaints.  No abd pain.  Hungry.  No further episodes of pain.  Objective: Vital signs in last 24 hours: Temp:  [97.7 F (36.5 C)-98 F (36.7 C)] 98 F (36.7 C) (11/12 0500) Pulse Rate:  [60-62] 62  (11/12 0500) Resp:  [18-19] 18  (11/12 0500) BP: (113-125)/(57-67) 125/67 mmHg (11/12 0500) SpO2:  [95 %-98 %] 97 % (11/12 0500) Weight:  [160 lb 15 oz (73 kg)] 160 lb 15 oz (73 kg) (11/12 0500) Last BM Date: 10/14/11  Intake/Output from previous day: 11/11 0701 - 11/12 0700 In: 2895 [P.O.:980; I.V.:1915] Out: 450 [Urine:450] Intake/Output this shift: Total I/O In: 720 [P.O.:720] Out: -   PE: Abd: soft, NT, ND, +BS Ht: regular Lungs: CTAB  Lab Results:   [facility] 10/15/11 1740 10/15/11 0820  WBC 9.3 7.3  HGB 16.2 14.7  HCT 42.9 41.0  PLT 178 191   BMET  Basename 10/16/11 0700 10/15/11 1740 10/15/11 0820  NA 125* -- 121*  K 3.8 -- 3.2*  CL 92* -- 84*  CO2 21 -- 20  GLUCOSE 102* -- 146*  BUN 9 -- 9  CREATININE 0.68 0.51 --  CALCIUM 8.9 9.1 --   PT/INR  Basename 10/16/11 0700 10/15/11 0820  LABPROT 13.9 12.7  INR 1.05 0.93     Studies/Results: HIDA: patent cystic duct without evidence of acute cholecystitis.  Some delayed uptake possibly c/w chronic cholecystitis.  Anti-infectives: Anti-infectives    None       Assessment/Plan  1. Abdominal pain, resolved  Plan:  Will allow pt to eat.  If pain not replicated, pt can go home and follow-up as outpt.  We do not feel that his symptoms are definitely related to his gallbladder and do not feel he needs an urgent/emeregent cholecystectomy, unless he replicates his symptoms with eating, etc.    LOS: 2 days    Kenyotta Dorfman E 10/17/2011

## 2011-10-17 NOTE — Progress Notes (Signed)
I have seen and examined the patient and agree with the assessment and plans  

## 2011-10-17 NOTE — Progress Notes (Signed)
10/17/2011 Anthony Curry [id number]       70 yo male patient admitted on 10/15/11 with c/o severe abdominal pain. Patient has a history of GERD and alcohol abuse. Prior to admission, patient lived at home with spouse and was independent with ADLs. In to speak to patient, not in room. CM will continue to monitor for discharge needs.

## 2011-10-17 NOTE — Progress Notes (Addendum)
Subjective: He is good for his nausea is resolved. He still continues to have some epigastric periumbilical pain  Objective: Vital signs in last 24 hours: Filed Vitals:   10/16/11 1500 10/16/11 1940 10/16/11 2100 10/17/11 0500  BP: 113/57  119/64 125/67  Pulse: 60 60 62 62  Temp: 98 F (36.7 C)  97.7 F (36.5 C) 98 F (36.7 C)  TempSrc: Oral  Oral Oral  Resp: 19  18 18   Height:      Weight:    73 kg (160 lb 15 oz)  SpO2: 95%  98% 97%    Intake/Output Summary (Last 24 hours) at 10/17/11 0930 Last data filed at 10/17/11 0700  Gross per 24 hour  Intake   2895 ml  Output    450 ml  Net   2445 ml    Weight change: 0.424 kg (15 oz)  General: Alert, awake, oriented x3, in no acute distress. HEENT: No bruits, no goiter. Heart: Regular rate and rhythm, without murmurs, rubs, gallops. Lungs: Clear to auscultation bilaterally. Abdomen: Soft, nontender, nondistended, positive bowel sounds. Extremities: No clubbing cyanosis or edema with positive pedal pulses. Neuro: Grossly intact, nonfocal.   Lab Results: No results found for this or any previous visit (from the past 24 hour(s)).   Micro: No results found for this or any previous visit (from the past 240 hour(s)).  Studies/Results: Ct Angio Chest W/cm &/or Wo Cm  10/15/2011  *RADIOLOGY REPORT*  Clinical Data:  Chest pain.  CT ANGIOGRAPHY CHEST WITH CONTRAST  Technique:  Multidetector CT imaging of the chest was performed using the standard protocol during bolus administration of intravenous contrast.  Multiplanar CT image reconstructions including MIPs were obtained to evaluate the vascular anatomy.  Contrast: 80mL OMNIPAQUE IOHEXOL 350 MG/ML IV SOLN  Comparison:  CT chest 06/02/2011.  Findings:  The chest wall is unremarkable.  The bony thorax is intact.  The heart is normal in size.  No pericardial effusion.  No mediastinal or hilar lymphadenopathy.  The aorta is normal in caliber.  Mild atherosclerotic changes but no focal  aneurysm or dissection.  Coronary artery calcifications are noted. Atherosclerotic calcifications involving the major branch vessels are noted but no significant stenosis.  The pulmonary arterial tree is fairly well opacified.  No filling defects to suggest pulmonary emboli.  The esophagus is grossly normal.  Examination of the lung parenchyma demonstrates advanced emphysematous changes.  Multiple vague ground-glass opacities are noted in both upper lobes.  These were present on the prior CT and continued surveillance is recommended.  Repeat study in 6 months without contrast is suggested.  There is bibasilar subpleural atelectasis.  No pleural effusion or pulmonary edema.  The upper abdomen is unremarkable.  There is diffuse fatty infiltration of the liver and moderate distention of the gallbladder.  Review of the MIP images confirms the above findings.  IMPRESSION:  1.  No CT findings for aortic dissection.  Scattered atherosclerotic changes but no aneurysm. 2.  Coronary artery calcifications. 3.  Bibasilar subsegmental atelectasis. 4.  Persistent upper lobe ground-glass opacities bilaterally. Recommend continued surveillance. 5.  Distended gallbladder.  Original Report Authenticated By: P. Loralie Champagne, M.D.   US Abdomen Complete  10/15/2011  *RADIOLOGY REPORT*    IMPRESSION:  1.  No evidence of cholecystitis. 2.  Small echogenic foci within the gallbladder represent either small polyps  or small adherent cholesterol stones.  Original Report Authenticated By: Genevive Bi, M.D.   Ct Abdomen Pelvis W Contrast  10/15/2011  *RADIOLOGY  REPORT*  .  IMPRESSION:  1.  No acute findings within the abdomen or the pelvis. 2.  Sigmoid diverticulosis without acute inflammation. 3.  Gallstones.  4.  Left adrenal nodule is stable from previous exam.  Original Report Authenticated By: Rosealee Albee, M.D.    Medications:     . amLODipine  10 mg Oral Daily  . atenolol  100 mg Oral Daily  . enoxaparin  40 mg  Subcutaneous Q24H  . magnesium oxide  400 mg Oral Daily  . olmesartan  40 mg Oral Daily  . pantoprazole  40 mg Oral BID AC  . thiamine  100 mg Oral Daily   Assessment: Principal Problem:  *Abdominal colic Active Problems:  HTN (hypertension)  Nausea & vomiting  Alcohol abuse  Hyponatremia  Hypokalemia   Plan: Plan:  #1 abdominal pain :Given his history of hypertension and long-standing history of smoking, CT and it was negative for aortic dissection . The patient does not have any evidence of cholecystitis/diverticulitis/pancreatitis, or any other intra-abdominal pathology to explain his abdominal pain. He does have gallstones and the symptoms could be consistent with biliary colic. [facility] consultation has been obtained. We will keep him n.p.o. until evaluated by surgery. Patient is to have a HIDA scan today . It is possible that the patient has gastritis related to alcohol use and bisphosphonate use. Therefore he will be continued on a PPI.  #2 electrolyte abnormalities The patient has hyponatremia and hypokalemia, probably related to his alcohol use. We will hydrate him with IV fluids, to see if her sodium corrects. His potassium and magnesium are improved from yesterday . We will repeat his labs in a.m. #3 For his alcohol abuse we will monitor him on CIWA protocol. Currently has no signs of withdrawal.   Disposition. The patient is to have had a HIDA scan today discharge plan will depend upon [facility]. recommendations   LOS: 2 days   [facility] 10/17/2011, 9:30 AM

## 2011-10-18 LAB — CLOSTRIDIUM DIFFICILE BY PCR: Toxigenic C. Difficile by PCR: NEGATIVE

## 2011-10-18 LAB — CBC
MCH: 37 pg — ABNORMAL HIGH (ref 26.0–34.0)
MCHC: 36.2 g/dL — ABNORMAL HIGH (ref 30.0–36.0)
Platelets: 172 10*3/uL (ref 150–400)

## 2011-10-18 MED ORDER — PANTOPRAZOLE SODIUM 40 MG PO TBEC: 40.0000 mg | DELAYED_RELEASE_TABLET | Freq: Two times a day (BID) | ORAL | Status: DC

## 2011-10-18 MED ORDER — PROMETHAZINE HCL 12.5 MG PO TABS: 12.5000 mg | ORAL_TABLET | Freq: Four times a day (QID) | ORAL | Status: DC | PRN

## 2011-10-18 MED ORDER — METRONIDAZOLE 500 MG PO TABS: 500.0000 mg | ORAL_TABLET | Freq: Three times a day (TID) | ORAL | Status: DC

## 2011-10-18 MED ORDER — THIAMINE HCL 100 MG PO TABS: 100.0000 mg | ORAL_TABLET | Freq: Every day | ORAL | Status: DC

## 2011-10-18 MED ORDER — TRAMADOL-ACETAMINOPHEN 37.5-325 MG PO TABS: 1.0000 | ORAL_TABLET | Freq: Four times a day (QID) | ORAL | Status: DC | PRN

## 2011-10-18 MED ORDER — TRAMADOL-ACETAMINOPHEN 37.5-325 MG PO TABS: 1.0000 | ORAL_TABLET | Freq: Four times a day (QID) | ORAL | Status: AC | PRN

## 2011-10-18 MED ORDER — ALUM & MAG HYDROXIDE-SIMETH 200-200-20 MG/5ML PO SUSP: 15.0000 mL | Freq: Four times a day (QID) | ORAL | Status: DC | PRN

## 2011-10-18 MED ORDER — MAGNESIUM OXIDE 400 MG PO TABS: 400.0000 mg | ORAL_TABLET | Freq: Every day | ORAL | Status: DC

## 2011-10-18 MED ORDER — METRONIDAZOLE 500 MG PO TABS: 500.0000 mg | ORAL_TABLET | Freq: Three times a day (TID) | ORAL | Status: AC

## 2011-10-18 MED ORDER — ALUM & MAG HYDROXIDE-SIMETH 200-200-20 MG/5ML PO SUSP: 15.0000 mL | Freq: Four times a day (QID) | ORAL | Status: AC | PRN

## 2011-10-18 NOTE — Progress Notes (Signed)
     Subjective: Patient has had no abdominal pain with the resumption of diet.  Tolerating po well  Objective: Vital signs in last 24 hours: Temp:  [97.9 F (36.6 C)-98.6 F (37 C)] 98.6 F (37 C) (11/13 0500) Pulse Rate:  [55-64] 64  (11/13 0500) Resp:  [18-20] 20  (11/13 0500) BP: (110-125)/(67-81) 125/81 mmHg (11/13 0500) SpO2:  [96 %-99 %] 98 % (11/13 0500) Last BM Date: 10/17/11  Intake/Output this shift:    Physical Exam: Abdomen soft, non-tender with no guarding  Labs: CBC  Basename 10/18/11 0540 10/15/11 1740  WBC 4.9 9.3  HGB 13.4 16.2  HCT 37.0* 42.9  PLT 172 178   BMET  Basename 10/17/11 1514 10/16/11 0700  NA 126* 125*  K 4.1 3.8  CL 94* 92*  CO2 26 21  GLUCOSE 82 102*  BUN 7 9  CREATININE 0.65 0.68  CALCIUM 8.6 8.9   LFT  Basename 10/17/11 1514  PROT 5.6*  ALBUMIN 2.9*  AST 23  ALT 16  ALKPHOS 97  BILITOT 1.2  BILIDIR --  IBILI --  LIPASE --   PT/INR  Basename 10/16/11 0700  LABPROT 13.9  INR 1.05   ABG No results found for this basename: PHART:2,PCO2:2,PO2:2,HCO3:2 in the last 72 hours  Studies/Results: Nm Hepato W/eject Fract  10/17/2011  *RADIOLOGY REPORT*  Clinical Data:  Evaluate for cholecystitis  NUCLEAR MEDICINE HEPATOBILIARY IMAGING  Technique:  Sequential images of the abdomen were obtained out to 60 minutes following intravenous administration of radiopharmaceutical.  Radiopharmaceutical:  5 mCi Tc-50m Choletec  Comparison:  10/15/2011  Findings: Following the intravenous administration of the radiopharmaceutical there is uniform tracer uptake by the liver with clearance from blood pool.  Radiotracer accumulation within the common bile duct and small bowel is noted by 15 minutes.  After 60 minutes no radiotracer activity is identified within the gallbladder.  The patient was subsequently given 3 mg of morphine sulfate intravenously the and delayed imaging was carried out for additional 30 minutes.  20 minutes after  administration of morphine radiotracer accumulation within the gallbladder was identified.  IMPRESSION:  1.  Patent cystic duct without evidence for acute cholecystitis. 2.  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  Original Report Authenticated By: Rosealee Albee, M.D.     Assessment: Principal Problem:  *Abdominal colic Active Problems:  HTN (hypertension)  Nausea & vomiting  Alcohol abuse  Hyponatremia  Hypokalemia  s/p  Plan: Abdominal pain of uncertain etiology.  Still uncertain that gallbladder is source.  OK from general surgery standpoint to d/c and continue workup as outpatient.  Would have patient f/u with [facility] (Dr. Magnus Ivan) in 2 - 3 weeks  LOS: 3 days    Anthony Curry A 10/18/2011

## 2011-10-18 NOTE — Discharge Summary (Signed)
Physician Discharge Summary  Anthony Curry MRN: [id number] DOB/AGE: 1941/10/12 70 y.o.  PCP: No primary provider on file.   Admit date: 10/15/2011 Discharge date: 10/18/2011  Primary care physician is Dr. Radonna Ricker with either family medicine  Discharge Diagnoses:    Principal Problem:  *Abdominal colic   HTN (hypertension)  Nausea & vomiting  Alcohol abuse  Hyponatremia  Hypokalemia   Current Discharge Medication List    START taking these medications   Details  alum & mag hydroxide-simeth (MAALOX/MYLANTA) 200-200-20 MG/5ML suspension Take 15 mLs by mouth every 6 (six) hours as needed (dyspepsia). Qty: 355 mL, Refills: 3    magnesium oxide (MAG-OX) 400 MG tablet Take 1 tablet (400 mg total) by mouth daily. Qty: 30 tablet, Refills: 0    metroNIDAZOLE (FLAGYL) 500 MG tablet Take 1 tablet (500 mg total) by mouth 3 (three) times daily. Qty: 21 tablet, Refills: 0    pantoprazole (PROTONIX) 40 MG tablet Take 1 tablet (40 mg total) by mouth 2 (two) times daily before a meal. Qty: 30 tablet, Refills: 2    promethazine (PHENERGAN) 12.5 MG tablet Take 1 tablet (12.5 mg total) by mouth every 6 (six) hours as needed for nausea. Qty: 30 tablet, Refills: 0    thiamine 100 MG tablet Take 1 tablet (100 mg total) by mouth daily. Qty: 30 tablet, Refills: 2    traMADol-acetaminophen (ULTRACET) 37.5-325 MG per tablet Take 1 tablet by mouth every 6 (six) hours as needed for pain. Qty: 30 tablet, Refills: 0      CONTINUE these medications which have NOT CHANGED   Details  alendronate (FOSAMAX) 70 MG tablet Take 70 mg by mouth every 7 (seven) days. Take with a full glass of water on an empty stomach.  Hold while in hospital     amLODipine (NORVASC) 10 MG tablet Take 10 mg by mouth daily.      atenolol (TENORMIN) 100 MG tablet Take 100 mg by mouth daily.      olmesartan (BENICAR) 40 MG tablet Take 40 mg by mouth daily.        STOP taking these medications     omeprazole (PRILOSEC) 20 MG capsule      traMADol (ULTRAM) 50 MG tablet         Discharge Condition: stable  Disposition: home   Consults: CCS    Significant Diagnostic Studies: Ct Angio Chest W/cm &/or Wo Cm  10/15/2011  *RADIOLOGY REPORT*  Clinical Data:  Chest pain.   IMPRESSION:  1.  No CT findings for aortic dissection.  Scattered atherosclerotic changes but no aneurysm. 2.  Coronary artery calcifications. 3.  Bibasilar subsegmental atelectasis. 4.  Persistent upper lobe ground-glass opacities bilaterally. Recommend continued surveillance. 5.  Distended gallbladder.  Original Report Authenticated By: P. Loralie Champagne, M.D.   US Abdomen Complete  10/15/2011  *RADIOLOGY REPORT*  Clinical Data:  Abdominal pain, concern for cholecystitis of the upper and through the roof and  COMPLETE ABDOMINAL ULTRASOUND   .  IMPRESSION:  1.  No evidence of cholecystitis. 2.  Small echogenic foci within the gallbladder represent either small polyps  or small adherent cholesterol stones.  Original Report Authenticated By: Genevive Bi, M.D.   Ct Abdomen Pelvis W Contrast  10/15/2011  *RADIOLOGY REPORT*  Clinical Data: Abdominal pain.  Nausea vomiting and diarrhea.  CT ABDOMEN AND PELVIS WITH CONTRAST   IMPRESSION:  1.  No acute findings within the abdomen or the pelvis. 2.  Sigmoid diverticulosis without acute inflammation.  3.  Gallstones.  4.  Left adrenal nodule is stable from previous exam.  Original Report Authenticated By: Rosealee Albee, M.D.   Nm Hepato W/eject Fract  10/17/2011  *RADIOLOGY REPORT*  Clinical Data:  Evaluate for cholecystitis  NUCLEAR MEDICINE HEPATOBILIARY IMAGING   IMPRESSION:  1.  Patent cystic duct without evidence for acute cholecystitis. 2.  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  Original Report Authenticated By: Rosealee Albee, M.D.      Microbiology:  C diff pending   Labs: Results for orders placed during the hospital  encounter of 10/15/11 (from the past 48 hour(s))  COMPREHENSIVE METABOLIC PANEL     Status: Abnormal   Collection Time   10/17/11  3:14 PM      Component Value Range Comment   Sodium 126 (*) 135 - 145 (mEq/L)    Potassium 4.1  3.5 - 5.1 (mEq/L)    Chloride 94 (*) 96 - 112 (mEq/L)    CO2 26  19 - 32 (mEq/L)    Glucose, Bld 82  70 - 99 (mg/dL)    BUN 7  6 - 23 (mg/dL)    Creatinine, Ser 1.61  0.50 - 1.35 (mg/dL)    Calcium 8.6  8.4 - 10.5 (mg/dL)    Total Protein 5.6 (*) 6.0 - 8.3 (g/dL)    Albumin 2.9 (*) 3.5 - 5.2 (g/dL)    AST 23  0 - 37 (U/L)    ALT 16  0 - 53 (U/L)    Alkaline Phosphatase 97  39 - 117 (U/L)    Total Bilirubin 1.2  0.3 - 1.2 (mg/dL)    GFR calc non Af Amer >90  >90 (mL/min)    GFR calc Af Amer >90  >90 (mL/min)   CBC     Status: Abnormal   Collection Time   10/18/11  5:40 AM      Component Value Range Comment   WBC 4.9  4.0 - 10.5 (K/uL)    RBC 3.62 (*) 4.22 - 5.81 (MIL/uL)    Hemoglobin 13.4  13.0 - 17.0 (g/dL)    HCT 09.6 (*) 04.5 - 52.0 (%)    MCV 102.2 (*) 78.0 - 100.0 (fL)    MCH 37.0 (*) 26.0 - 34.0 (pg)    MCHC 36.2 (*) 30.0 - 36.0 (g/dL)    RDW 40.9  81.1 - 91.4 (%)    Platelets 172  150 - 400 (K/uL)      HPI :This is a 70 year old male with end to the ER with a chief complaint of abdominal pain radiating into his chest. . The patient yesterday woke 2 days ago   early in the morning with excruciating abdominal. pain radiating into his back and into his chest. Associated with some nausea. He wanted his wife to call 911 last night but then changed his mind. He came in today for the unrelenting pain lasting 6-12 hours. The patient also had some nausea vomiting in the ER. He denied any fever chills or rigors. He denied any diarrhea or constipation ,he denied any hematemesis. He admited to drinking up to 4-6 beers on daily basis. He stated that he does not withdraw if he does not drink alcohol. He denied any chest pain any shortness of breath he denies any  recent history of travel denies any sick contacts.   HOSPITAL COURSE:  After being seen and admitted he had a CT scan which was basically negative with the exception of a somewhat dilated  gallbladder. This was followed up by an ultrasound which showed either some small adherent gallstones or some cholesterol polyps. There was nothing to suggest acute cholecystitis.  Surgical evaluation was requested because of the finding of gallstones and to evaluate whether that might have been the source of the patient's severe pain. He does note that he has had a long history of reflux type symptoms although this pain was much worse than his typical reflux. As noted it was basically subxiphoid. Then radiated up into the posterior chest area.  It was also stated that the episode of severe epigastric abdominal pain was of uncertain etiology. Probable small gallstones uncertain whether this was the cause of his abdominal pain. CCS recommended that he have a hepatobiliary scan with emptying study to see if he has some biliary dyskinesia or evidence for acute cholecystitis. A case could be made for doing a cholecystectomy given his history of waking up at 2:30 in the morning with severe pain and the suggestion is he has biliary tract disease by ultrasound. The patient's HIDA scan was unremarkable. The patient's diet was advanced. He tolerated of regular diet. There is no need for urgent or emergent cholecystectomy at this time.   Chest pain this could be secondary to gastroesophageal reflux disease versus diffuse esophageal spasm, the patient has been switched to Protonix, and has been provided with a prescription for Maalox. He states that he had a stress test couple of years ago which was negative. I will defer to Dr. Fulton Mole to decide if he needs another stress is currently asymptomatic from a cardiac standpoint. His cardiac enzymes were cycled and were found to be negative and his EKG was unremarkable.  Alcohol  dependence: Patient did not have any signs of withdrawal he does have some stigmata of alcohol dependence including hyponatremia and macrocytic anemia, social worker consult has been obtained and he has been counseled about the.  Diarrhea:for 2 days , c diff is pending, he has been instructed to  Start flagyl if it doesn't get better in the next 24hrs . Diarrhea  Discharge Exam:  Blood pressure 125/81, pulse 64, temperature 98.6 F (37 C), temperature source Oral, resp. rate 20, height 6' (1.829 m), weight 73 kg (160 lb 15 oz), SpO2 98.00%.  General: Alert, awake, oriented x3, in no acute distress. HEENT: No bruits, no goiter. Heart: Regular rate and rhythm, without murmurs, rubs, gallops. Lungs: Clear to auscultation bilaterally. Abdomen: Soft, nontender, nondistended, positive bowel sounds. Extremities: No clubbing cyanosis or edema with positive pedal pulses. Neuro: Grossly intact, nonfocal.     Discharge Orders    Future Orders Please Complete By Expires   Diet - low sodium heart healthy      Increase activity slowly         Follow-up Information    Follow up with [name],[name] ALEXANDER in 6 days.   Contact information:   [employer] [address] [place] [address] [phone]          Signed: Richarda Overlie 10/18/2011, 8:18 AM

## 2011-11-02 ENCOUNTER — Other Ambulatory Visit: Payer: Self-pay | Admitting: Family Medicine

## 2011-11-02 DIAGNOSIS — J984 Other disorders of lung: Secondary | ICD-10-CM

## 2011-12-02 ENCOUNTER — Ambulatory Visit
Admission: RE | Admit: 2011-12-02 | Discharge: 2011-12-02 | Disposition: A | Discharge status: Home / Self Care | Payer: Medicare Other | Source: Ambulatory Visit | Attending: Family Medicine | Admitting: Family Medicine

## 2011-12-02 DIAGNOSIS — J984 Other disorders of lung: Secondary | ICD-10-CM

## 2011-12-16 DIAGNOSIS — E871 Hypo-osmolality and hyponatremia: Secondary | ICD-10-CM | POA: Diagnosis not present

## 2012-01-18 ENCOUNTER — Other Ambulatory Visit: Payer: Self-pay | Admitting: Orthopedic Surgery

## 2012-01-18 DIAGNOSIS — M19019 Primary osteoarthritis, unspecified shoulder: Secondary | ICD-10-CM | POA: Diagnosis not present

## 2012-01-18 DIAGNOSIS — M25512 Pain in left shoulder: Secondary | ICD-10-CM

## 2012-01-19 ENCOUNTER — Ambulatory Visit
Admission: RE | Admit: 2012-01-19 | Discharge: 2012-01-19 | Disposition: A | Discharge status: Home / Self Care | Payer: Medicare Other | Source: Ambulatory Visit | Attending: Orthopedic Surgery | Admitting: Orthopedic Surgery

## 2012-01-19 DIAGNOSIS — M19019 Primary osteoarthritis, unspecified shoulder: Secondary | ICD-10-CM | POA: Diagnosis not present

## 2012-01-19 DIAGNOSIS — M25819 Other specified joint disorders, unspecified shoulder: Secondary | ICD-10-CM | POA: Diagnosis not present

## 2012-01-19 DIAGNOSIS — M8569 Other cyst of bone, multiple sites: Secondary | ICD-10-CM | POA: Diagnosis not present

## 2012-01-19 DIAGNOSIS — M25512 Pain in left shoulder: Secondary | ICD-10-CM

## 2012-01-27 ENCOUNTER — Encounter (HOSPITAL_BASED_OUTPATIENT_CLINIC_OR_DEPARTMENT_OTHER): Payer: Self-pay | Admitting: *Deleted

## 2012-01-27 NOTE — Progress Notes (Signed)
Pt was in hospital 11/12 with abd chest pain-worked up-gi=and chronic cholecystitis

## 2012-01-27 NOTE — Progress Notes (Signed)
Will have anesthesia review pt for total shoulder

## 2012-01-28 ENCOUNTER — Other Ambulatory Visit: Payer: Self-pay | Admitting: Orthopedic Surgery

## 2012-02-01 ENCOUNTER — Encounter (HOSPITAL_BASED_OUTPATIENT_CLINIC_OR_DEPARTMENT_OTHER)
Admission: RE | Admit: 2012-02-01 | Discharge: 2012-02-01 | Disposition: A | Discharge status: Home / Self Care | Payer: Medicare Other | Source: Ambulatory Visit | Attending: Orthopedic Surgery | Admitting: Orthopedic Surgery

## 2012-02-01 LAB — COMPREHENSIVE METABOLIC PANEL
Albumin: 3.7 g/dL (ref 3.5–5.2)
BUN: 10 mg/dL (ref 6–23)
Creatinine, Ser: 0.77 mg/dL (ref 0.50–1.35)
Potassium: 4.3 mEq/L (ref 3.5–5.1)
Sodium: 130 mEq/L — ABNORMAL LOW (ref 135–145)
Total Protein: 6.5 g/dL (ref 6.0–8.3)

## 2012-02-01 LAB — PROTIME-INR
INR: 0.94 (ref 0.00–1.49)
Prothrombin Time: 12.8 seconds (ref 11.6–15.2)

## 2012-02-01 LAB — APTT: aPTT: 30 seconds (ref 24–37)

## 2012-02-02 NOTE — Progress Notes (Signed)
After labs and review and pt consult-dr frederick oked pt to be done here cds

## 2012-02-03 ENCOUNTER — Encounter (HOSPITAL_BASED_OUTPATIENT_CLINIC_OR_DEPARTMENT_OTHER)
Admission: RE | Disposition: A | Discharge status: Home / Self Care | Payer: Self-pay | Source: Ambulatory Visit | Attending: Orthopedic Surgery

## 2012-02-03 ENCOUNTER — Encounter (HOSPITAL_BASED_OUTPATIENT_CLINIC_OR_DEPARTMENT_OTHER): Payer: Self-pay | Admitting: Orthopedic Surgery

## 2012-02-03 ENCOUNTER — Encounter (HOSPITAL_BASED_OUTPATIENT_CLINIC_OR_DEPARTMENT_OTHER): Payer: Self-pay | Admitting: Certified Registered Nurse Anesthetist

## 2012-02-03 ENCOUNTER — Ambulatory Visit (HOSPITAL_BASED_OUTPATIENT_CLINIC_OR_DEPARTMENT_OTHER)
Admission: RE | Admit: 2012-02-03 | Discharge: 2012-02-04 | Disposition: A | Discharge status: Home / Self Care | Payer: Medicare Other | Source: Ambulatory Visit | Attending: Orthopedic Surgery | Admitting: Orthopedic Surgery

## 2012-02-03 ENCOUNTER — Ambulatory Visit (HOSPITAL_BASED_OUTPATIENT_CLINIC_OR_DEPARTMENT_OTHER): Payer: Medicare Other | Admitting: Certified Registered Nurse Anesthetist

## 2012-02-03 DIAGNOSIS — I10 Essential (primary) hypertension: Secondary | ICD-10-CM | POA: Insufficient documentation

## 2012-02-03 DIAGNOSIS — K219 Gastro-esophageal reflux disease without esophagitis: Secondary | ICD-10-CM | POA: Diagnosis not present

## 2012-02-03 DIAGNOSIS — M19019 Primary osteoarthritis, unspecified shoulder: Secondary | ICD-10-CM | POA: Diagnosis not present

## 2012-02-03 DIAGNOSIS — Z01812 Encounter for preprocedural laboratory examination: Secondary | ICD-10-CM | POA: Diagnosis not present

## 2012-02-03 DIAGNOSIS — M25519 Pain in unspecified shoulder: Secondary | ICD-10-CM | POA: Diagnosis not present

## 2012-02-03 DIAGNOSIS — F1010 Alcohol abuse, uncomplicated: Secondary | ICD-10-CM | POA: Insufficient documentation

## 2012-02-03 DIAGNOSIS — G8918 Other acute postprocedural pain: Secondary | ICD-10-CM | POA: Diagnosis not present

## 2012-02-03 HISTORY — PX: TOTAL SHOULDER ARTHROPLASTY: SHX126

## 2012-02-03 HISTORY — DX: Gastro-esophageal reflux disease without esophagitis: K21.9

## 2012-02-03 LAB — POCT HEMOGLOBIN-HEMACUE: Hemoglobin: 13.7 g/dL (ref 13.0–17.0)

## 2012-02-03 SURGERY — ARTHROPLASTY, SHOULDER, TOTAL
Anesthesia: Choice | Site: Shoulder | Laterality: Left | Wound class: Clean

## 2012-02-03 MED ORDER — ZOLPIDEM TARTRATE 5 MG PO TABS: 5.0000 mg | ORAL_TABLET | Freq: Every evening | ORAL | Status: DC | PRN

## 2012-02-03 MED ORDER — DOCUSATE SODIUM 100 MG PO CAPS: 100.0000 mg | ORAL_CAPSULE | Freq: Two times a day (BID) | ORAL | Status: DC

## 2012-02-03 MED ORDER — CEFAZOLIN SODIUM 1-5 GM-% IV SOLN
1.0000 g | Freq: Four times a day (QID) | INTRAVENOUS | Status: AC
  Administered 2012-02-03 – 2012-02-04 (×3): 1 g via INTRAVENOUS

## 2012-02-03 MED ORDER — SODIUM CHLORIDE 0.9 % IV SOLN
INTRAVENOUS | Status: DC
  Administered 2012-02-03: 18:00:00 via INTRAVENOUS

## 2012-02-03 MED ORDER — MIDAZOLAM HCL 2 MG/2ML IJ SOLN
2.0000 mg | INTRAMUSCULAR | Status: DC | PRN
  Administered 2012-02-03: 2 mg via INTRAVENOUS

## 2012-02-03 MED ORDER — METOCLOPRAMIDE HCL 5 MG PO TABS: 5.0000 mg | ORAL_TABLET | Freq: Three times a day (TID) | ORAL | Status: DC | PRN

## 2012-02-03 MED ORDER — EPHEDRINE SULFATE 50 MG/ML IJ SOLN
INTRAMUSCULAR | Status: DC | PRN
  Administered 2012-02-03 (×2): 10 mg via INTRAVENOUS

## 2012-02-03 MED ORDER — IRBESARTAN 300 MG PO TABS
300.0000 mg | ORAL_TABLET | Freq: Every day | ORAL | Status: DC
  Administered 2012-02-03: 300 mg via ORAL

## 2012-02-03 MED ORDER — OXYCODONE HCL 5 MG PO TABS: 5.0000 mg | ORAL_TABLET | ORAL | Status: DC | PRN

## 2012-02-03 MED ORDER — HYDROMORPHONE HCL PF 1 MG/ML IJ SOLN: 0.5000 mg | INTRAMUSCULAR | Status: DC | PRN

## 2012-02-03 MED ORDER — THIAMINE HCL 100 MG PO TABS
100.0000 mg | ORAL_TABLET | Freq: Every day | ORAL | Status: DC
  Administered 2012-02-03: 100 mg via ORAL

## 2012-02-03 MED ORDER — METHOCARBAMOL 500 MG PO TABS: 500.0000 mg | ORAL_TABLET | Freq: Four times a day (QID) | ORAL | Status: DC | PRN

## 2012-02-03 MED ORDER — ATENOLOL 100 MG PO TABS
100.0000 mg | ORAL_TABLET | Freq: Every day | ORAL | Status: DC
  Administered 2012-02-03: 100 mg via ORAL

## 2012-02-03 MED ORDER — SENNA 8.6 MG PO TABS: 1.0000 | ORAL_TABLET | Freq: Two times a day (BID) | ORAL | Status: DC

## 2012-02-03 MED ORDER — METOCLOPRAMIDE HCL 5 MG/ML IJ SOLN: 5.0000 mg | Freq: Three times a day (TID) | INTRAMUSCULAR | Status: DC | PRN

## 2012-02-03 MED ORDER — PROMETHAZINE HCL 25 MG PO TABS: 25.0000 mg | ORAL_TABLET | Freq: Four times a day (QID) | ORAL | Status: DC | PRN

## 2012-02-03 MED ORDER — LACTATED RINGERS IV SOLN
INTRAVENOUS | Status: DC
  Administered 2012-02-03 (×3): via INTRAVENOUS

## 2012-02-03 MED ORDER — ONDANSETRON HCL 4 MG/2ML IJ SOLN
INTRAMUSCULAR | Status: DC | PRN
  Administered 2012-02-03: 4 mg via INTRAVENOUS

## 2012-02-03 MED ORDER — PANTOPRAZOLE SODIUM 40 MG PO TBEC
40.0000 mg | DELAYED_RELEASE_TABLET | Freq: Every day | ORAL | Status: DC
  Administered 2012-02-03: 40 mg via ORAL

## 2012-02-03 MED ORDER — ONDANSETRON HCL 4 MG/2ML IJ SOLN: 4.0000 mg | Freq: Four times a day (QID) | INTRAMUSCULAR | Status: DC | PRN

## 2012-02-03 MED ORDER — METHOCARBAMOL 100 MG/ML IJ SOLN: 500.0000 mg | Freq: Four times a day (QID) | INTRAVENOUS | Status: DC | PRN

## 2012-02-03 MED ORDER — ACETAMINOPHEN 10 MG/ML IV SOLN: 1000.0000 mg | Freq: Once | INTRAVENOUS | Status: DC

## 2012-02-03 MED ORDER — MAGNESIUM OXIDE 400 MG PO TABS
400.0000 mg | ORAL_TABLET | Freq: Every day | ORAL | Status: DC
  Administered 2012-02-03: 400 mg via ORAL

## 2012-02-03 MED ORDER — SODIUM CHLORIDE 0.9 % IR SOLN
Status: DC | PRN
  Administered 2012-02-03: 3000 mL

## 2012-02-03 MED ORDER — 0.9 % SODIUM CHLORIDE (POUR BTL) OPTIME
TOPICAL | Status: DC | PRN
  Administered 2012-02-03: 1000 mL

## 2012-02-03 MED ORDER — SUCCINYLCHOLINE CHLORIDE 20 MG/ML IJ SOLN
INTRAMUSCULAR | Status: DC | PRN
  Administered 2012-02-03: 100 mg via INTRAVENOUS

## 2012-02-03 MED ORDER — CEFAZOLIN SODIUM 1-5 GM-% IV SOLN
INTRAVENOUS | Status: DC | PRN
  Administered 2012-02-03: 1 g via INTRAVENOUS

## 2012-02-03 MED ORDER — OXYCODONE-ACETAMINOPHEN 10-325 MG PO TABS: 1.0000 | ORAL_TABLET | Freq: Four times a day (QID) | ORAL | Status: AC | PRN

## 2012-02-03 MED ORDER — DEXAMETHASONE SODIUM PHOSPHATE 4 MG/ML IJ SOLN
INTRAMUSCULAR | Status: DC | PRN
  Administered 2012-02-03: 10 mg via INTRAVENOUS

## 2012-02-03 MED ORDER — OXYCODONE-ACETAMINOPHEN 5-325 MG PO TABS
1.0000 | ORAL_TABLET | ORAL | Status: DC | PRN
  Administered 2012-02-04: 2 via ORAL

## 2012-02-03 MED ORDER — ONDANSETRON HCL 4 MG PO TABS: 4.0000 mg | ORAL_TABLET | Freq: Four times a day (QID) | ORAL | Status: DC | PRN

## 2012-02-03 MED ORDER — BUPIVACAINE-EPINEPHRINE PF 0.5-1:200000 % IJ SOLN
INTRAMUSCULAR | Status: DC | PRN
  Administered 2012-02-03: 30 mL

## 2012-02-03 MED ORDER — AMLODIPINE BESYLATE 10 MG PO TABS
10.0000 mg | ORAL_TABLET | Freq: Every day | ORAL | Status: DC
  Administered 2012-02-03: 10 mg via ORAL

## 2012-02-03 MED ORDER — CEFAZOLIN SODIUM 1-5 GM-% IV SOLN: 1.0000 g | INTRAVENOUS | Status: DC

## 2012-02-03 MED ORDER — PHENYLEPHRINE HCL 10 MG/ML IJ SOLN
10.0000 mg | INTRAVENOUS | Status: DC | PRN
  Administered 2012-02-03: 40 ug/min via INTRAVENOUS

## 2012-02-03 MED ORDER — LIDOCAINE HCL (CARDIAC) 20 MG/ML IV SOLN
INTRAVENOUS | Status: DC | PRN
  Administered 2012-02-03: 50 mg via INTRAVENOUS

## 2012-02-03 MED ORDER — PANTOPRAZOLE SODIUM 40 MG PO TBEC: 40.0000 mg | DELAYED_RELEASE_TABLET | Freq: Two times a day (BID) | ORAL | Status: DC

## 2012-02-03 MED ORDER — FENTANYL CITRATE 0.05 MG/ML IJ SOLN
100.0000 ug | INTRAMUSCULAR | Status: DC | PRN
  Administered 2012-02-03: 100 ug via INTRAVENOUS

## 2012-02-03 MED ORDER — PROPOFOL 10 MG/ML IV EMUL
INTRAVENOUS | Status: DC | PRN
  Administered 2012-02-03: 120 mg via INTRAVENOUS

## 2012-02-03 SURGICAL SUPPLY — 77 items
ADPR HD STD TPR HUM TI RVRS (Orthopedic Implant) ×1 IMPLANT
APL SKNCLS STERI-STRIP NONHPOA (GAUZE/BANDAGES/DRESSINGS) ×1
BENZOIN TINCTURE PRP APPL 2/3 (GAUZE/BANDAGES/DRESSINGS) ×2 IMPLANT
BLADE SAW SAG 29X58X.64 (BLADE) ×1 IMPLANT
BLADE SURG 10 STRL SS (BLADE) ×1 IMPLANT
BLADE SURG 15 STRL LF DISP TIS (BLADE) ×3 IMPLANT
BLADE SURG 15 STRL SS (BLADE) ×4
CANISTER SUCTION 2500CC (MISCELLANEOUS) ×2 IMPLANT
CLEANER CAUTERY TIP 5X5 PAD (MISCELLANEOUS) ×1 IMPLANT
CLOTH BEACON ORANGE TIMEOUT ST (SAFETY) ×2 IMPLANT
DECANTER SPIKE VIAL GLASS SM (MISCELLANEOUS) ×1 IMPLANT
DEPUY SMART MIX SYRINGE ×1 IMPLANT
DRAPE INCISE IOBAN 66X45 STRL (DRAPES) ×2 IMPLANT
DRAPE SURG 17X23 STRL (DRAPES) ×1 IMPLANT
DRAPE U 20/CS (DRAPES) ×2 IMPLANT
DRAPE U-SHAPE 47X51 STRL (DRAPES) ×2 IMPLANT
DRAPE U-SHAPE 76X120 STRL (DRAPES) ×5 IMPLANT
DURAPREP 26ML APPLICATOR (WOUND CARE) ×2 IMPLANT
DePuy CMW1 Bone Cement ×1 IMPLANT
ELECT BLADE 6.5 .24CM SHAFT (ELECTRODE) IMPLANT
ELECT REM PT RETURN 9FT ADLT (ELECTROSURGICAL) ×2
ELECTRODE REM PT RTRN 9FT ADLT (ELECTROSURGICAL) ×1 IMPLANT
GAUZE SPONGE 4X4 16PLY XRAY LF (GAUZE/BANDAGES/DRESSINGS) IMPLANT
GAUZE XEROFORM 1X8 LF (GAUZE/BANDAGES/DRESSINGS) IMPLANT
GLENOID PEGGED STRL MED 4MM (Orthopedic Implant) ×1 IMPLANT
GLENOID POST REGENREX HYBRID (Orthopedic Implant) ×1 IMPLANT
GLOVE BIO SURGEON STRL SZ8 (GLOVE) ×2 IMPLANT
GLOVE BIOGEL PI IND STRL 7.0 (GLOVE) IMPLANT
GLOVE BIOGEL PI IND STRL 8 (GLOVE) ×1 IMPLANT
GLOVE BIOGEL PI INDICATOR 7.0 (GLOVE) ×1
GLOVE BIOGEL PI INDICATOR 8 (GLOVE) ×1
GLOVE ECLIPSE 6.5 STRL STRAW (GLOVE) ×1 IMPLANT
GLOVE ORTHO TXT STRL SZ7.5 (GLOVE) ×2 IMPLANT
GOWN PREVENTION PLUS XLARGE (GOWN DISPOSABLE) ×3 IMPLANT
GOWN PREVENTION PLUS XXLARGE (GOWN DISPOSABLE) ×2 IMPLANT
HANDPIECE INTERPULSE COAX TIP (DISPOSABLE) ×2
HEAD HUMERAL COMP STD (Orthopedic Implant) IMPLANT
HEAD MODULAR W VARIABLE OFFSET (Orthopedic Implant) IMPLANT
HUMERAL HEAD COMP STD (Orthopedic Implant) ×2 IMPLANT
MODULAR HEAD W VARIABLE OFFSET (Orthopedic Implant) ×2 IMPLANT
NS IRRIG 1000ML POUR BTL (IV SOLUTION) ×2 IMPLANT
PACK ARTHROSCOPY DSU (CUSTOM PROCEDURE TRAY) ×2 IMPLANT
PACK BASIN DAY SURGERY FS (CUSTOM PROCEDURE TRAY) ×2 IMPLANT
PAD CLEANER CAUTERY TIP 5X5 (MISCELLANEOUS) ×1
QUICK RELEASE DRILL BIT ×3 IMPLANT
REVERSE SHOULDER STEINMAN PIN ×1 IMPLANT
SET HNDPC FAN SPRY TIP SCT (DISPOSABLE) IMPLANT
SLEEVE SCD COMPRESS KNEE MED (MISCELLANEOUS) ×2 IMPLANT
SLING ARM FOAM STRAP LRG (SOFTGOODS) IMPLANT
SLING ARM FOAM STRAP MED (SOFTGOODS) IMPLANT
SLING ARM FOAM STRAP XLG (SOFTGOODS) IMPLANT
SLING ARM IMMOBILIZER LRG (SOFTGOODS) ×1 IMPLANT
SLING ARM IMMOBILIZER MED (SOFTGOODS) IMPLANT
SPONGE GAUZE 4X4 12PLY (GAUZE/BANDAGES/DRESSINGS) ×2 IMPLANT
SPONGE LAP 18X18 X RAY DECT (DISPOSABLE) ×3 IMPLANT
SPONGE LAP 4X18 X RAY DECT (DISPOSABLE) ×2 IMPLANT
STEINMAN PIN THREADED TIP ×1 IMPLANT
STEM HUMERAL STRL 14MMX140MM (Orthopedic Implant) ×1 IMPLANT
STRIP CLOSURE SKIN 1/2X4 (GAUZE/BANDAGES/DRESSINGS) ×3 IMPLANT
SUCTION FRAZIER TIP 10 FR DISP (SUCTIONS) ×2 IMPLANT
SUPPORT WRAP ARM LG (MISCELLANEOUS) ×2 IMPLANT
SUT FIBERWIRE #2 38 T-5 BLUE (SUTURE) ×8
SUT MNCRL AB 4-0 PS2 18 (SUTURE) ×1 IMPLANT
SUT VIC AB 0 CT1 18XCR BRD 8 (SUTURE) IMPLANT
SUT VIC AB 0 CT1 27 (SUTURE) ×2
SUT VIC AB 0 CT1 27XBRD ANBCTR (SUTURE) ×2 IMPLANT
SUT VIC AB 0 CT1 8-18 (SUTURE)
SUT VIC AB 2-0 SH 27 (SUTURE)
SUT VIC AB 2-0 SH 27XBRD (SUTURE) IMPLANT
SUT VICRYL 3-0 CR8 SH (SUTURE) ×3 IMPLANT
SUTURE FIBERWR #2 38 T-5 BLUE (SUTURE) ×4 IMPLANT
SYR BULB IRRIGATION 50ML (SYRINGE) ×2 IMPLANT
TAPE STRIPS DRAPE STRL (GAUZE/BANDAGES/DRESSINGS) IMPLANT
TOWEL OR 17X24 6PK STRL BLUE (TOWEL DISPOSABLE) ×2 IMPLANT
TUBE CONNECTING 20X1/4 (TUBING) ×1 IMPLANT
WATER STERILE IRR 1000ML POUR (IV SOLUTION) ×1 IMPLANT
YANKAUER SUCT BULB TIP NO VENT (SUCTIONS) ×2 IMPLANT

## 2012-02-03 NOTE — Transfer of Care (Signed)
Immediate Anesthesia Transfer of Care Note  Patient: Anthony Curry  Procedure(s) Performed: Procedure(s) (LRB): TOTAL SHOULDER ARTHROPLASTY (Left)  Patient Location: PACU  Anesthesia Type: GA combined with regional for post-op pain  Level of Consciousness: awake, oriented and patient cooperative  Airway & Oxygen Therapy: Patient Spontanous Breathing and Patient connected to face mask oxygen  Post-op Assessment: Report given to PACU RN, Post -op Vital signs reviewed and stable and Patient moving all extremities  Post vital signs: Reviewed and stable  Complications: No apparent anesthesia complications

## 2012-02-03 NOTE — Progress Notes (Signed)
Assisted Dr. Fitzgerald with left, interscalene  block. Side rails up, monitors on throughout procedure. See vital signs in flow sheet. Tolerated Procedure well. 

## 2012-02-03 NOTE — Discharge Instructions (Signed)
Shoulder Joint Replacement Shoulder replacement (arthroplasty) is a procedure that may be recommended if joint disease makes your shoulder stiff and painful, or if the upper arm bone is badly damaged from an accident. The shoulder is a ball-and-socket joint that allows for a wide range of motion. The head of the upper arm bone (humerus) is the ball, and a circular depression (glenoid) in the shoulder bone (scapula) is the socket. A soft-tissue rim (labrum) surrounds and deepens the socket. The head of the upper arm bone is coated with a smooth, durable covering called cartilage, and the joint has a thin, inner lining (synovium) for smooth movement. The surrounding muscles and tendons provide stability and support. IMPLANT DESIGN AND CONSTRUCTION  Shoulder replacement surgery replaces damaged surfaces with artificial parts (prostheses). Usually, there are two parts used to replace this joint.  The humeral component replaces the head of the upper arm bone. It is made of metal (usually cobalt/chromium-based alloys). This is a rounded ball attached to a stem that fits into the humerus bone. This part comes in various sizes and can be a single piece or a modular unit.   The glenoid component replaces the socket (the glenoid depression). It is made of ultrahigh density polyethelene. Some versions have a metal tray, but totally plastic versions are more common.  Depending on the damage to your shoulder, the surgeon may replace just the humeral head (a hemiarthroplasty) or both the humeral head and the glenoid (total shoulder replacement). The shoulder parts come in various sizes and shapes to fit the patient. They are held in place with either bone cement (cemented) or bone ingrowth (cementless).  The surrounding muscles and tendons hold the prosthesis parts in place, the same as the original shoulder. Each case is individual and your surgeon will study your situation carefully before making any decisions. Ask  what type of implant will be used in you and why that choice is appropriate for you. RISKS AND COMPLICATIONS  Complications after shoulder replacement surgery occur less often than with other joint replacement surgeries. However, there are risks. The most common complications are:  Infection.   Upper arm bone fracture that occurs during surgery (intraoperative fracture) or postoperative fractures.   Postoperative instability.   Loosening of the glenoid component over time.  Advances in surgical techniques and prosthetic devices are helping to lessen the chances of complications.  PROCEDURE   Either regional (numb in the shoulder area) or general (sleep during the procedure) anesthesia may be used during shoulder replacement surgery. Your caregiver or anesthesiologist will advise you on the best type of anesthesia for you.   The surgical cut (incision) is 4" to 6" (10cm to 15cm) long and is made on the front of the shoulder from the collarbone (clavicle) to the point where the shoulder muscle (deltoid) attaches to the upper arm bone. The surgeon will take care not to injure the nerves or blood vessels that cross the shoulder.   The upper arm bone is dislocated from the socket to expose the ball-like end of the upper arm. Only the portion of the bone covered by cartilage is removed. Articular cartilage covers the ends of bones where they meet the ends of other bones.   The center cavity of the humerus bone is cleaned and enlarged with reamers to create a hollow area that matches the shape of the implant stem. The top end of the bone is smoothed so the stem can be inserted flush with the bone surface.  If the ball of the prosthesis is a separate piece, the proper size is selected and attached.   If the socket portion of the joint is basically healthy and the surrounding muscles are intact, the surgeon may decide not to replace it. However, if the socket is arthritic, the upper arm bone is moved  to the back and the surgeon will implant the glenoid component. The surgeon prepares the socket surface by removing the remaining damaged cartilage. The socket bone is then gently reamed to match the implant. Protrusions on the artifical socket part are then fitted into holes drilled in the bone surface. Once the part fits it is cemented into position.   The arm bone, with its new artificial head, is replaced in the socket. The surgeon reattaches the supporting tendons and closes the incision.   The arm is placed in a sling and a support pillow is placed under the elbow to protect the repair.   Tubes are placed to remove excess drainage. These are usually removed a day or two later.  REHABILITATION AFTER SURGERY A rehabilitation program is important to the success of the operation. If the surgery is scheduled for the morning, therapy can begin later that day, and no later than the first day after the procedure. A physical therapist will start gentle range of motion exercises. In these, your arm is gently put through all its motions. Before you leave the hospital (usually two or three days after surgery), your therapist will show you in how to use a pulley device to help bend and extend your arm and will give you directions for other home exercises. HOME CARE INSTRUCTIONS  You may resume normal diet and activities as directed or allowed. Wear the sling every night for at least the first month, or as instructed by your surgeon.   Do not use your arm to push yourself up in bed or from a chair. This requires too much force on the surgically repaired muscles.   Follow the program of home exercises suggested. Do the exercises 4 to 5 times a day for a month or as directed.   Try not to overuse your shoulder. It is easy to do if this is the first time you have been pain free in a long time. Early overuse of the shoulder may result in later problems.   Do not lift anything heavier than a cup of coffee for  the first 6 weeks after surgery.   Ask for help at home. Your caregiver may be able to suggest an agency for this if you do not have home support.   Do not participate in contact sports or do any heavy lifting (more than 10 pounds) for at least 6 months, or as directed.   Keep ice packs (a bag of ice wrapped in a towel) on the surgical area for 15 to 20 minutes, 3 to 4 times per day, for the first two days following surgery.   Change dressings if necessary or as directed. Shower and get the wound wet as directed.   Only take over-the-counter or prescription medicines for pain, discomfort, or fever as directed by your caregiver.   Follow the directions of your surgeon.   Keep appointments as directed.  Document Released: 08/20/2003 Document Revised: 08/03/2011 Document Reviewed: 11/11/2008 [facility] Patient Information 2012 [place], [place].  Regional Anesthesia Blocks  1. Numbness or the inability to move the "blocked" extremity may last from 3-48 hours after placement. The length of time depends on the  medication injected and your individual response to the medication. If the numbness is not going away after 48 hours, call your surgeon.  2. The extremity that is blocked will need to be protected until the numbness is gone and the  Strength has returned. Because you cannot feel it, you will need to take extra care to avoid injury. Because it may be weak, you may have difficulty moving it or using it. You may not know what position it is in without looking at it while the block is in effect.  3. For blocks in the legs and feet, returning to weight bearing and walking needs to be done carefully. You will need to wait until the numbness is entirely gone and the strength has returned. You should be able to move your leg and foot normally before you try and bear weight or walk. You will need someone to be with you when you first try to ensure you do not fall and possibly risk injury.  4. Bruising  and tenderness at the needle site are common side effects and will resolve in a few days.  5. Persistent numbness or new problems with movement should be communicated to the surgeon or the [facility] [phone]).   [facility]  [address] [place], [place] [address] [phone]   Post Anesthesia Home Care Instructions  Activity: Get plenty of rest for the remainder of the day. A responsible adult should stay with you for 24 hours following the procedure.  For the next 24 hours, DO NOT: -Drive a car -Advertising copywriter -Drink alcoholic beverages -Take any medication unless instructed by your physician -Make any legal decisions or sign important papers.  Meals: Start with liquid foods such as gelatin or soup. Progress to regular foods as tolerated. Avoid greasy, spicy, heavy foods. If nausea and/or vomiting occur, drink only clear liquids until the nausea and/or vomiting subsides. Call your physician if vomiting continues.  Special Instructions/Symptoms: Your throat may feel dry or sore from the anesthesia or the breathing tube placed in your throat during surgery. If this causes discomfort, gargle with warm salt water. The discomfort should disappear within 24 hours.

## 2012-02-03 NOTE — OR Nursing (Signed)
Wife given update that patient is stable and procedure is progressing as planned.

## 2012-02-03 NOTE — Anesthesia Procedure Notes (Addendum)
Anesthesia Regional Block:  Interscalene brachial plexus block  Pre-Anesthetic Checklist: ,, timeout performed, Correct Patient, Correct Site, Correct Laterality, Correct Procedure, Correct Position, site marked, Risks and benefits discussed, pre-op evaluation,  At surgeon's request and post-op pain management  Laterality: Left  Prep: Maximum Sterile Barrier Precautions used and chloraprep       Needles:  Injection technique: Single-shot  Needle Type: Echogenic Stimulator Needle      Needle Gauge: 22 and 22 G    Additional Needles:  Procedures: nerve stimulator Interscalene brachial plexus block  Nerve Stimulator or Paresthesia:  Response: Biceps response, 0.4 mA,   Additional Responses:   Narrative:  Start time: 02/03/2012 12:30 PM End time: 02/03/2012 12:46 PM Injection made incrementally with aspirations every 5 mL. Anesthesiologist: Sampson Goon, MD  Additional Notes: 2% Lidocaine skin wheel.   Interscalene brachial plexus block Procedure Name: Intubation Date/Time: 02/03/2012 1:07 PM Performed by: Suann Larry WOLFE Pre-anesthesia Checklist: Patient identified, Emergency Drugs available, Suction available, Patient being monitored and Timeout performed Patient Re-evaluated:Patient Re-evaluated prior to inductionOxygen Delivery Method: Circle System Utilized Preoxygenation: Pre-oxygenation with 100% oxygen Intubation Type: IV induction Ventilation: Mask ventilation without difficulty Laryngoscope Size: Miller and 2 Grade View: Grade I Tube type: Oral Tube size: 8.0 mm Number of attempts: 1 Airway Equipment and Method: stylet and oral airway Placement Confirmation: ETT inserted through vocal cords under direct vision,  positive ETCO2 and breath sounds checked- equal and bilateral Secured at: 23 cm Tube secured with: Tape Dental Injury: Teeth and Oropharynx as per pre-operative assessment

## 2012-02-03 NOTE — Anesthesia Preprocedure Evaluation (Addendum)
Anesthesia Evaluation  Patient identified by MRN, date of birth, ID band Patient awake    Reviewed: Allergy & Precautions, H&P , NPO status , Patient's Chart, lab work & pertinent test results, reviewed documented beta blocker date and time   Airway Mallampati: II TM Distance: >3 FB Neck ROM: Full    Dental No notable dental hx. (+) Edentulous Upper and Partial Lower   Pulmonary neg pulmonary ROS,  clear to auscultation  Pulmonary exam normal       Cardiovascular hypertension, On Medications and On Home Beta Blockers Regular Normal    Neuro/Psych Negative Neurological ROS  Negative Psych ROS   GI/Hepatic Neg liver ROS, GERD-  Medicated and Controlled,  Endo/Other  Negative Endocrine ROS  Renal/GU negative Renal ROS  Genitourinary negative   Musculoskeletal   Abdominal   Peds  Hematology negative hematology ROS (+)   Anesthesia Other Findings   Reproductive/Obstetrics negative OB ROS                           Anesthesia Physical Anesthesia Plan  ASA: III  Anesthesia Plan: General   Post-op Pain Management:    Induction: Intravenous  Airway Management Planned: Oral ETT  Additional Equipment:   Intra-op Plan:   Post-operative Plan: Extubation in OR  Informed Consent: I have reviewed the patients History and Physical, chart, labs and discussed the procedure including the risks, benefits and alternatives for the proposed anesthesia with the patient or authorized representative who has indicated his/her understanding and acceptance.   Dental Advisory Given  Plan Discussed with: CRNA  Anesthesia Plan Comments:        Anesthesia Quick Evaluation

## 2012-02-03 NOTE — Op Note (Signed)
02/03/2012  3:59 PM  PATIENT:  Anthony Curry    PRE-OPERATIVE DIAGNOSIS:  Left shoulder degenerative arthritis  POST-OPERATIVE DIAGNOSIS:  Same  PROCEDURE:  TOTAL SHOULDER ARTHROPLASTY  SURGEON:  Eulas Post, MD  PHYSICIAN ASSISTANT: Janace Litten, OPA-C, present and scrubbed throughout the case, critical for completion in a timely fashion, and for retraction, instrumentation, and closure.  ANESTHESIA:   General  PREOPERATIVE INDICATIONS:  Anthony Curry is a  71 y.o. male with a diagnosis of Left shoulder degenerative arthritis who failed conservative measures and elected for surgical management.    The risks benefits and alternatives were discussed with the patient preoperatively including but not limited to the risks of infection, bleeding, nerve injury, cardiopulmonary complications, the need for revision surgery, among others, and the patient was willing to proceed. We also discussed the risks of dislocation, revision surgery, subscapularis rupture, incomplete relief of pain, incomplete return of function, among others.  OPERATIVE IMPLANTS: Biomet size medium glenoid with 3 pegs, cemented, and a regenerex central peg. On the humerus I used a size 14 press-fit stem and I bone grafted the humeral cyst and I used a 46 x 18 Versadial head placed in the c position with the offset posterior and one bag of Depuy CMW1 bone cement for the glenoid with a Versadial adapter for the humeral head.  OPERATIVE FINDINGS: Severe osteoarthritis with advanced posterior wear and sizable cystic formation in the posterior aspect of the humeral head.  OPERATIVE PROCEDURE: The patient was brought to the operating room and placed in supine position. Regional block and had been given. General anesthesia administered. He was placed in a beachchair position and the left upper extremity was prepped and draped in usual sterile fashion. Time out was performed. Deltopectoral approach was carried out. The cephalic  vein was retracted laterally. The deltoid was mobilized and deep retractors placed. I performed a biceps tenodesis by sewing this to the pectoralis tendon. I went into the interval, and identified the upper and lower borders of the subscapularis and performed a tendon splitting subscapularis approach.  I then externally rotated the head and performed a circumferential capsular releases. I applied the hand reamers through the head, and then progressively reamed up to a size 14 which gave me the first sign of purchase on the cortical bone. I then assembled the proximal jig, and cut the humeral head. There was a very large cyst in the posterior aspect of the head which was curetted of its lining.  I cut the head and 30 of retroversion. I then went to the glenoid, but access to the glenoid was still limited, so I cut a little bit more off of the head in order to allow direct access to the glenoid. I performed circumferential releases and excision of labrum around the glenoid.  Once I had complete exposure of the glenoid I placed a guidepin into the central location. I tried to correct slightly for the advanced posterior wear, allowing slightly acentric reaming to remove more of the anterior glenoid. I took care not to completely decorticate the glenoid, but simply to remove any chondral surface. I left the chondral plate intact, in order to maintain a bony platform.  I then sized the glenoid, and it was between a medium and a large, although the osteophyte formation on the glenoid was somewhat advanced, making the glenoid larger than it actually was. Therefore I selected a medium, and then applied the drill guide, and then drilled my holes. The central hole had excellent bony  purchase, as did the anterior and superior hole. As would be expected, the posterior hole perforated, do to the substantial posterior wear.  I placed a trial, and was happy with its position. I also tried to correct the inclination, as  he had worn somewhat medially inferiorly.  I then irrigated the glenoid, and cemented the real glenoid into place taking care not to allow cement into the central hole. I placed just a small amount of cement in the posterior hole do to the perforation in order to minimize extrusion.  I then allowed the glenoid cement to cure completely, and turned my attention back to the humerus.  I then sequentially broached, up to the 14, and despite the large posterior cyst I have excellent press-fit hold. Therefore I selected a 14, and then impacted the real stem into place. I then applied bone graft around the cyst, filling it entirely. I also examined the humeral head, and selected the above-named implants. I debated long and hard about going to a bigger implant, although much of his humeral head appeared to be osteophyte, and therefore I selected the slightly smaller implant, hoping to optimize motion. I tried to strike an adequate balance between stability and mobility. In general his tissues were fairly flexible, however I did not want to overstuff his shoulder.  The stem had more bone posteriorly, so I placed the dial at C., and applied this posteriorly to create more coverage.  I irrigated the wounds copiously, and then repaired the subscapularis with 2-0 FiberWire, a total of 4 stitches, and I also repaired the rotator interval. This was done with the arm in neutral position. The mobility was excellent and the shoulder felt stable. He did have a tendency towards posterior subluxation, which I suspect was a consequence of his long-term posterior wear. Nonetheless the reconstruction was achieved successfully, and I irrigated it was copiously, and repaired the deltopectoral interval with Vicryl and then Vicryl for the subcutaneous tissue with Steri-Strips and sterile gauze. He was awakened and returned to the PACU in stable and satisfactory condition. There no complications and he tolerated the procedure  well.

## 2012-02-03 NOTE — Anesthesia Postprocedure Evaluation (Signed)
  Anesthesia Post-op Note  Patient: Anthony Curry  Procedure(s) Performed: Procedure(s) (LRB): TOTAL SHOULDER ARTHROPLASTY (Left)  Patient Location: PACU  Anesthesia Type: GA combined with regional for post-op pain  Level of Consciousness: awake, alert  and oriented  Airway and Oxygen Therapy: Patient Spontanous Breathing  Post-op Pain: none  Post-op Assessment: Post-op Vital signs reviewed, Patient's Cardiovascular Status Stable, Respiratory Function Stable, Patent Airway and No signs of Nausea or vomiting  Post-op Vital Signs: Reviewed and stable  Complications: No apparent anesthesia complications

## 2012-02-03 NOTE — H&P (Signed)
PREOPERATIVE H&P  Chief Complaint: left shoulder degenerative arthritis  HPI: Anthony Curry is a 71 y.o. male who presents for preoperative history and physical with a diagnosis of left shoulder degenerative arthritis. Symptoms are rated as moderate to severe, and have been worsening.  This is significantly impairing activities of daily living.  He has elected for surgical management. He has failed conservative treatment including injections, activity modification, anti-inflammatories.  Past Medical History  Diagnosis Date  . Hypertension   . GERD (gastroesophageal reflux disease)   . Alcohol abuse    Past Surgical History  Procedure Date  . Hemorrhoid surgery   . Ankle arthrodesis 1995    rt fx-hardware in  . Tonsillectomy   . Colonoscopy   . Excision / curettage bone cyst finger 2005    rt thumb   History   Social History  . Marital Status: Married    Spouse Name: N/A    Number of Children: N/A  . Years of Education: N/A   Social History Main Topics  . Smoking status: Former Smoker    Quit date: 01/27/1984  . Smokeless tobacco: None  . Alcohol Use: 0.0 oz/week    4-5 Cans of beer per week     admits to 4-6 beers daily  . Drug Use: No  . Sexually Active:    Other Topics Concern  . None   Social History Narrative  . None   History reviewed. No pertinent family history. No Known Allergies Prior to Admission medications   Medication Sig Start Date End Date Taking? Authorizing Provider  omeprazole (PRILOSEC) 20 MG capsule Take 20 mg by mouth 2 (two) times daily.   Yes Historical Provider, MD  traMADol (ULTRAM) 50 MG tablet Take 50 mg by mouth every 6 (six) hours as needed.   Yes Historical Provider, MD  alendronate (FOSAMAX) 70 MG tablet Take 70 mg by mouth every 7 (seven) days. Take with a full glass of water on an empty stomach.  Hold while in hospital     Historical Provider, MD  amLODipine (NORVASC) 10 MG tablet Take 10 mg by mouth daily.      Historical  Provider, MD  atenolol (TENORMIN) 100 MG tablet Take 100 mg by mouth daily.      Historical Provider, MD  magnesium oxide (MAG-OX) 400 MG tablet Take 1 tablet (400 mg total) by mouth daily. 10/18/11 10/17/12  Richarda Overlie, MD  olmesartan (BENICAR) 40 MG tablet Take 40 mg by mouth daily.      Historical Provider, MD  pantoprazole (PROTONIX) 40 MG tablet Take 1 tablet (40 mg total) by mouth 2 (two) times daily before a meal. 10/18/11 10/17/12  Richarda Overlie, MD  thiamine 100 MG tablet Take 1 tablet (100 mg total) by mouth daily. 10/18/11 10/17/12  Richarda Overlie, MD     Positive ROS: All other systems have been reviewed and were otherwise negative with the exception of those mentioned in the HPI and as above.  Physical Exam: General: Alert, no acute distress Cardiovascular: No pedal edema Respiratory: No cyanosis, no use of accessory musculature GI: No organomegaly, abdomen is soft and non-tender Skin: No lesions in the area of chief complaint Neurologic: Sensation intact distally Psychiatric: Patient is competent for consent with normal mood and affect Lymphatic: No axillary or cervical lymphadenopathy  MUSCULOSKELETAL: Left shoulder active forward flexion is 0-80 at best, he does this with crepitance and pain. Rotator cuff strength seems intact.  Assessment: left shoulder degenerative arthritis  Plan: Plan for  Procedure(s): TOTAL SHOULDER ARTHROPLASTY  The risks benefits and alternatives were discussed with the patient including but not limited to the risks of nonoperative treatment, versus surgical intervention including infection, bleeding, nerve injury,  blood clots, cardiopulmonary complications, morbidity, mortality, among others, and they were willing to proceed. We've also discussed the risks of revision surgery, dislocation, incomplete relief of pain, among others.  Eulas Post, MD 02/03/2012 7:31 AM

## 2012-02-10 ENCOUNTER — Encounter (HOSPITAL_BASED_OUTPATIENT_CLINIC_OR_DEPARTMENT_OTHER): Payer: Self-pay | Admitting: Orthopedic Surgery

## 2012-02-20 DIAGNOSIS — M19019 Primary osteoarthritis, unspecified shoulder: Secondary | ICD-10-CM | POA: Diagnosis not present

## 2012-02-20 DIAGNOSIS — Z4789 Encounter for other orthopedic aftercare: Secondary | ICD-10-CM | POA: Diagnosis not present

## 2012-03-22 DIAGNOSIS — M6281 Muscle weakness (generalized): Secondary | ICD-10-CM | POA: Diagnosis not present

## 2012-03-22 DIAGNOSIS — M25619 Stiffness of unspecified shoulder, not elsewhere classified: Secondary | ICD-10-CM | POA: Diagnosis not present

## 2012-03-22 DIAGNOSIS — M25519 Pain in unspecified shoulder: Secondary | ICD-10-CM | POA: Diagnosis not present

## 2012-03-26 DIAGNOSIS — M25519 Pain in unspecified shoulder: Secondary | ICD-10-CM | POA: Diagnosis not present

## 2012-03-26 DIAGNOSIS — M6281 Muscle weakness (generalized): Secondary | ICD-10-CM | POA: Diagnosis not present

## 2012-03-26 DIAGNOSIS — M25619 Stiffness of unspecified shoulder, not elsewhere classified: Secondary | ICD-10-CM | POA: Diagnosis not present

## 2012-03-28 DIAGNOSIS — M6281 Muscle weakness (generalized): Secondary | ICD-10-CM | POA: Diagnosis not present

## 2012-03-28 DIAGNOSIS — M25519 Pain in unspecified shoulder: Secondary | ICD-10-CM | POA: Diagnosis not present

## 2012-03-28 DIAGNOSIS — M25619 Stiffness of unspecified shoulder, not elsewhere classified: Secondary | ICD-10-CM | POA: Diagnosis not present

## 2012-04-04 DIAGNOSIS — M25519 Pain in unspecified shoulder: Secondary | ICD-10-CM | POA: Diagnosis not present

## 2012-04-04 DIAGNOSIS — M25619 Stiffness of unspecified shoulder, not elsewhere classified: Secondary | ICD-10-CM | POA: Diagnosis not present

## 2012-04-04 DIAGNOSIS — M6281 Muscle weakness (generalized): Secondary | ICD-10-CM | POA: Diagnosis not present

## 2012-04-11 DIAGNOSIS — M25519 Pain in unspecified shoulder: Secondary | ICD-10-CM | POA: Diagnosis not present

## 2012-04-11 DIAGNOSIS — M6281 Muscle weakness (generalized): Secondary | ICD-10-CM | POA: Diagnosis not present

## 2012-04-11 DIAGNOSIS — M25619 Stiffness of unspecified shoulder, not elsewhere classified: Secondary | ICD-10-CM | POA: Diagnosis not present

## 2012-04-12 DIAGNOSIS — M6281 Muscle weakness (generalized): Secondary | ICD-10-CM | POA: Diagnosis not present

## 2012-04-12 DIAGNOSIS — M25619 Stiffness of unspecified shoulder, not elsewhere classified: Secondary | ICD-10-CM | POA: Diagnosis not present

## 2012-04-12 DIAGNOSIS — M25519 Pain in unspecified shoulder: Secondary | ICD-10-CM | POA: Diagnosis not present

## 2012-05-01 DIAGNOSIS — R972 Elevated prostate specific antigen [PSA]: Secondary | ICD-10-CM | POA: Diagnosis not present

## 2012-05-08 DIAGNOSIS — N529 Male erectile dysfunction, unspecified: Secondary | ICD-10-CM | POA: Diagnosis not present

## 2012-05-08 DIAGNOSIS — N401 Enlarged prostate with lower urinary tract symptoms: Secondary | ICD-10-CM | POA: Diagnosis not present

## 2012-05-08 DIAGNOSIS — R972 Elevated prostate specific antigen [PSA]: Secondary | ICD-10-CM | POA: Diagnosis not present

## 2012-08-19 ENCOUNTER — Emergency Department (HOSPITAL_COMMUNITY)
Admission: EM | Admit: 2012-08-19 | Discharge: 2012-08-19 | Disposition: A | Discharge status: Home / Self Care | Payer: Medicare Other | Attending: Emergency Medicine | Admitting: Emergency Medicine

## 2012-08-19 ENCOUNTER — Encounter (HOSPITAL_COMMUNITY): Payer: Self-pay | Admitting: Emergency Medicine

## 2012-08-19 DIAGNOSIS — K219 Gastro-esophageal reflux disease without esophagitis: Secondary | ICD-10-CM | POA: Insufficient documentation

## 2012-08-19 DIAGNOSIS — Z87891 Personal history of nicotine dependence: Secondary | ICD-10-CM | POA: Insufficient documentation

## 2012-08-19 DIAGNOSIS — F1010 Alcohol abuse, uncomplicated: Secondary | ICD-10-CM | POA: Insufficient documentation

## 2012-08-19 DIAGNOSIS — I10 Essential (primary) hypertension: Secondary | ICD-10-CM | POA: Insufficient documentation

## 2012-08-19 DIAGNOSIS — R338 Other retention of urine: Secondary | ICD-10-CM | POA: Insufficient documentation

## 2012-08-19 DIAGNOSIS — R339 Retention of urine, unspecified: Secondary | ICD-10-CM | POA: Diagnosis not present

## 2012-08-19 LAB — URINALYSIS, MICROSCOPIC ONLY
Bilirubin Urine: NEGATIVE
Ketones, ur: NEGATIVE mg/dL
Nitrite: NEGATIVE
pH: 7 (ref 5.0–8.0)

## 2012-08-19 MED ORDER — SUCRALFATE 1 G PO TABS: ORAL_TABLET | ORAL | Status: DC

## 2012-08-19 MED ORDER — SUCRALFATE 1 G PO TABS
1.0000 g | ORAL_TABLET | Freq: Once | ORAL | Status: AC
  Administered 2012-08-19: 1 g via ORAL
  Filled 2012-08-19: qty 1

## 2012-08-19 MED ORDER — CIPROFLOXACIN HCL 500 MG PO TABS: 500.0000 mg | ORAL_TABLET | Freq: Two times a day (BID) | ORAL | Status: AC

## 2012-08-19 MED ORDER — TAMSULOSIN HCL 0.4 MG PO CAPS: 0.4000 mg | ORAL_CAPSULE | Freq: Every day | ORAL | Status: DC

## 2012-08-19 NOTE — ED Notes (Signed)
Patient states he has not been able to urinate for 2 days.  Also c/o heartburn for the past several days.  Stated "don't give me Prilosec, it doesn't work".

## 2012-08-19 NOTE — ED Notes (Signed)
PT. REPORTS URINARY RETENTION / UNABLE TO URINATE FOR 2 DAYS . PT. ALSO REPORTS GASTRIC REFLUX/HEART BURN FOR SEVERAL DAYS .

## 2012-08-19 NOTE — ED Provider Notes (Signed)
History     CSN: [id number]  Arrival date & time 08/19/12  0865   First MD Initiated Contact with Patient 08/19/12 0703      Chief Complaint  Patient presents with  . Urinary Retention    (Consider location/radiation/quality/duration/timing/severity/associated sxs/prior treatment) Patient is a 71 y.o. male presenting with male genitourinary complaint. The history is provided by the patient, medical records and the spouse. No language interpreter was used.  Male GU Problem Primary symptoms comment: Pt had onset of difficulty urinating, with urgency and inabiility to void, for 2 days. This is a new problem. The current episode started 2 days ago. The problem occurs constantly. The problem has not changed since onset.Associated symptoms comments: He has noted increased indigestion over the past 2 days.  He has known GERD.  [name] There has been no fever. He has tried nothing for the symptoms.    Past Medical History  Diagnosis Date  . Hypertension   . GERD (gastroesophageal reflux disease)   . Alcohol abuse   . Osteoarthritis of left shoulder 02/03/2012    Past Surgical History  Procedure Date  . Hemorrhoid surgery   . Ankle arthrodesis 1995    rt fx-hardware in  . Tonsillectomy   . Colonoscopy   . Excision / curettage bone cyst finger 2005    rt thumb  . Total shoulder arthroplasty 02/03/2012    Procedure: TOTAL SHOULDER ARTHROPLASTY;  Surgeon: Eulas Post, MD;  Location: [facility];  Service: Orthopedics;  Laterality: Left;    No family history on file.  History  Substance Use Topics  . Smoking status: Former Smoker    Quit date: 01/27/1984  . Smokeless tobacco: Not on file  . Alcohol Use: 0.0 oz/week    4-5 Cans of beer per week     admits to 4-6 beers daily      Review of Systems  Constitutional: Negative for fever and chills.  HENT: Negative.   Eyes: Negative.   Respiratory: Negative.   Cardiovascular: Negative.   Gastrointestinal:   Indigestion  Genitourinary: Positive for difficulty urinating.  Musculoskeletal: Negative.   Skin: Negative.   Neurological: Negative.   Psychiatric/Behavioral: Negative.     Allergies  Review of patient's allergies indicates no known allergies.  Home Medications   Current Outpatient Rx  Name Route Sig Dispense Refill  . AMLODIPINE BESYLATE 10 MG PO TABS Oral Take 10 mg by mouth daily.      . ATENOLOL 100 MG PO TABS Oral Take 100 mg by mouth daily.      [name] OLMESARTAN MEDOXOMIL 40 MG PO TABS Oral Take 40 mg by mouth daily.      [name] OMEPRAZOLE 20 MG PO CPDR Oral Take 20 mg by mouth 2 (two) times daily.    [name] PROMETHAZINE HCL 12.5 MG PO TABS Oral Take 1 tablet (12.5 mg total) by mouth every 6 (six) hours as needed for nausea. 30 tablet 0  . PROMETHAZINE HCL 25 MG PO TABS Oral Take 1 tablet (25 mg total) by mouth every 6 (six) hours as needed for nausea. 30 tablet 0    BP 137/58  Temp 98.3 F (36.8 C) (Oral)  Resp 18  SpO2 93%  Physical Exam  Nursing note and vitals reviewed. Constitutional: He is oriented to person, place, and time. He appears well-developed and well-nourished. No distress.  HENT:  Head: Normocephalic and atraumatic.  Right Ear: External ear normal.  Left Ear: External ear normal.  Mouth/Throat: Oropharynx is  clear and moist.  Eyes: Conjunctivae normal and EOM are normal. Pupils are equal, round, and reactive to light. No scleral icterus.  Neck: Normal range of motion. Neck supple.  Cardiovascular: Normal rate, regular rhythm and normal heart sounds.   Pulmonary/Chest: Effort normal and breath sounds normal.  Abdominal: Bowel sounds are normal. He exhibits no distension. There is no tenderness.  Genitourinary:       Has foley catheter, with about 350 ml urine in the bag.  Musculoskeletal: Normal range of motion.  Neurological: He is alert and oriented to person, place, and time.       No sensory or motor deficit.  Skin: Skin is warm and dry.  Psychiatric: He  has a normal mood and affect. His behavior is normal.    ED Course  Procedures (including critical care time)  Results for orders placed during the hospital encounter of 08/19/12  URINALYSIS, WITH MICROSCOPIC      Component Value Range   Color, Urine YELLOW  YELLOW   APPearance CLOUDY (*) CLEAR   Specific Gravity, Urine 1.015  1.005 - 1.030   pH 7.0  5.0 - 8.0   Glucose, UA NEGATIVE  NEGATIVE mg/dL   Hgb urine dipstick MODERATE (*) NEGATIVE   Bilirubin Urine NEGATIVE  NEGATIVE   Ketones, ur NEGATIVE  NEGATIVE mg/dL   Protein, ur 30 (*) NEGATIVE mg/dL   Urobilinogen, UA 1.0  0.0 - 1.0 mg/dL   Nitrite NEGATIVE  NEGATIVE   Leukocytes, UA MODERATE (*) NEGATIVE   WBC, UA 11-20  <3 WBC/hpf   RBC / HPF 3-6  <3 RBC/hpf   Bacteria, UA MANY (*) RARE   Squamous Epithelial / LPF RARE  RARE   Disp:  He can take Cipro 500 mg bid for UTI, Flomax 0.4 mg qd to facilitate urination.  Given Leg Bag, advised to call his urologist, Dr. Barron Alvine, tomorrow to set up a time to have trial of urination. Add Carafate to his regimen for GERD.   1. Acute urinary retention   2. GERD (gastroesophageal reflux disease)           Carleene Cooper III, MD 08/20/12 [phone]

## 2012-08-19 NOTE — ED Notes (Signed)
Pt resting, no needs at this time; family at beside

## 2012-08-21 DIAGNOSIS — N401 Enlarged prostate with lower urinary tract symptoms: Secondary | ICD-10-CM | POA: Diagnosis not present

## 2012-08-21 DIAGNOSIS — R339 Retention of urine, unspecified: Secondary | ICD-10-CM | POA: Diagnosis not present

## 2012-08-21 DIAGNOSIS — N410 Acute prostatitis: Secondary | ICD-10-CM | POA: Diagnosis not present

## 2012-08-21 DIAGNOSIS — R972 Elevated prostate specific antigen [PSA]: Secondary | ICD-10-CM | POA: Diagnosis not present

## 2012-08-21 LAB — URINE CULTURE

## 2012-08-23 NOTE — ED Notes (Signed)
+   Urine Patient treated with cipro-sensitive to same-chart appended per protocol MD. 

## 2012-09-04 DIAGNOSIS — N401 Enlarged prostate with lower urinary tract symptoms: Secondary | ICD-10-CM | POA: Diagnosis not present

## 2012-09-07 DIAGNOSIS — Z23 Encounter for immunization: Secondary | ICD-10-CM | POA: Diagnosis not present

## 2012-10-10 DIAGNOSIS — N401 Enlarged prostate with lower urinary tract symptoms: Secondary | ICD-10-CM | POA: Diagnosis not present

## 2012-11-08 DIAGNOSIS — R972 Elevated prostate specific antigen [PSA]: Secondary | ICD-10-CM | POA: Diagnosis not present

## 2012-11-13 DIAGNOSIS — R339 Retention of urine, unspecified: Secondary | ICD-10-CM | POA: Diagnosis not present

## 2012-11-13 DIAGNOSIS — R972 Elevated prostate specific antigen [PSA]: Secondary | ICD-10-CM | POA: Diagnosis not present

## 2012-11-13 DIAGNOSIS — N401 Enlarged prostate with lower urinary tract symptoms: Secondary | ICD-10-CM | POA: Diagnosis not present

## 2012-12-10 DIAGNOSIS — M542 Cervicalgia: Secondary | ICD-10-CM | POA: Diagnosis not present

## 2013-04-08 DIAGNOSIS — I10 Essential (primary) hypertension: Secondary | ICD-10-CM | POA: Diagnosis not present

## 2013-04-08 DIAGNOSIS — Z23 Encounter for immunization: Secondary | ICD-10-CM | POA: Diagnosis not present

## 2013-04-08 DIAGNOSIS — F411 Generalized anxiety disorder: Secondary | ICD-10-CM | POA: Diagnosis not present

## 2013-04-08 DIAGNOSIS — N529 Male erectile dysfunction, unspecified: Secondary | ICD-10-CM | POA: Diagnosis not present

## 2013-04-08 DIAGNOSIS — Z0000 Encounter for general adult medical examination without abnormal findings: Secondary | ICD-10-CM | POA: Diagnosis not present

## 2013-04-08 DIAGNOSIS — K219 Gastro-esophageal reflux disease without esophagitis: Secondary | ICD-10-CM | POA: Diagnosis not present

## 2013-05-08 DIAGNOSIS — N401 Enlarged prostate with lower urinary tract symptoms: Secondary | ICD-10-CM | POA: Diagnosis not present

## 2013-05-08 DIAGNOSIS — R972 Elevated prostate specific antigen [PSA]: Secondary | ICD-10-CM | POA: Diagnosis not present

## 2013-05-14 DIAGNOSIS — R339 Retention of urine, unspecified: Secondary | ICD-10-CM | POA: Diagnosis not present

## 2013-05-14 DIAGNOSIS — N401 Enlarged prostate with lower urinary tract symptoms: Secondary | ICD-10-CM | POA: Diagnosis not present

## 2013-05-14 DIAGNOSIS — N529 Male erectile dysfunction, unspecified: Secondary | ICD-10-CM | POA: Diagnosis not present

## 2013-05-14 DIAGNOSIS — R972 Elevated prostate specific antigen [PSA]: Secondary | ICD-10-CM | POA: Diagnosis not present

## 2013-05-15 DIAGNOSIS — E871 Hypo-osmolality and hyponatremia: Secondary | ICD-10-CM | POA: Diagnosis not present

## 2013-07-03 ENCOUNTER — Other Ambulatory Visit (HOSPITAL_COMMUNITY): Payer: Medicare Other

## 2013-07-03 ENCOUNTER — Emergency Department (HOSPITAL_COMMUNITY): Payer: Medicare Other

## 2013-07-03 ENCOUNTER — Inpatient Hospital Stay (HOSPITAL_COMMUNITY)
Admission: EM | Admit: 2013-07-03 | Discharge: 2013-07-08 | DRG: 645 | Disposition: A | Discharge status: Home / Self Care | Payer: Medicare Other | Attending: Internal Medicine | Admitting: Internal Medicine

## 2013-07-03 ENCOUNTER — Encounter (HOSPITAL_COMMUNITY): Payer: Self-pay

## 2013-07-03 DIAGNOSIS — E871 Hypo-osmolality and hyponatremia: Secondary | ICD-10-CM

## 2013-07-03 DIAGNOSIS — S0180XA Unspecified open wound of other part of head, initial encounter: Secondary | ICD-10-CM | POA: Diagnosis not present

## 2013-07-03 DIAGNOSIS — R296 Repeated falls: Secondary | ICD-10-CM | POA: Diagnosis present

## 2013-07-03 DIAGNOSIS — S0181XA Laceration without foreign body of other part of head, initial encounter: Secondary | ICD-10-CM

## 2013-07-03 DIAGNOSIS — Z79899 Other long term (current) drug therapy: Secondary | ICD-10-CM

## 2013-07-03 DIAGNOSIS — Z96619 Presence of unspecified artificial shoulder joint: Secondary | ICD-10-CM

## 2013-07-03 DIAGNOSIS — S0993XA Unspecified injury of face, initial encounter: Secondary | ICD-10-CM | POA: Diagnosis not present

## 2013-07-03 DIAGNOSIS — F1010 Alcohol abuse, uncomplicated: Secondary | ICD-10-CM

## 2013-07-03 DIAGNOSIS — K219 Gastro-esophageal reflux disease without esophagitis: Secondary | ICD-10-CM

## 2013-07-03 DIAGNOSIS — M542 Cervicalgia: Secondary | ICD-10-CM | POA: Diagnosis not present

## 2013-07-03 DIAGNOSIS — W19XXXA Unspecified fall, initial encounter: Secondary | ICD-10-CM

## 2013-07-03 DIAGNOSIS — R911 Solitary pulmonary nodule: Secondary | ICD-10-CM

## 2013-07-03 DIAGNOSIS — F10929 Alcohol use, unspecified with intoxication, unspecified: Secondary | ICD-10-CM | POA: Diagnosis present

## 2013-07-03 DIAGNOSIS — J984 Other disorders of lung: Secondary | ICD-10-CM | POA: Diagnosis not present

## 2013-07-03 DIAGNOSIS — E236 Other disorders of pituitary gland: Principal | ICD-10-CM | POA: Diagnosis present

## 2013-07-03 DIAGNOSIS — I10 Essential (primary) hypertension: Secondary | ICD-10-CM

## 2013-07-03 DIAGNOSIS — N400 Enlarged prostate without lower urinary tract symptoms: Secondary | ICD-10-CM

## 2013-07-03 DIAGNOSIS — I119 Hypertensive heart disease without heart failure: Secondary | ICD-10-CM | POA: Diagnosis present

## 2013-07-03 DIAGNOSIS — S199XXA Unspecified injury of neck, initial encounter: Secondary | ICD-10-CM | POA: Diagnosis not present

## 2013-07-03 DIAGNOSIS — S0990XA Unspecified injury of head, initial encounter: Secondary | ICD-10-CM | POA: Diagnosis not present

## 2013-07-03 DIAGNOSIS — R51 Headache: Secondary | ICD-10-CM | POA: Diagnosis not present

## 2013-07-03 DIAGNOSIS — F10920 Alcohol use, unspecified with intoxication, uncomplicated: Secondary | ICD-10-CM

## 2013-07-03 DIAGNOSIS — D638 Anemia in other chronic diseases classified elsewhere: Secondary | ICD-10-CM

## 2013-07-03 DIAGNOSIS — W19XXXD Unspecified fall, subsequent encounter: Secondary | ICD-10-CM

## 2013-07-03 LAB — CBC WITH DIFFERENTIAL/PLATELET
Basophils Relative: 0 % (ref 0–1)
Eosinophils Absolute: 0.1 10*3/uL (ref 0.0–0.7)
Eosinophils Relative: 2 % (ref 0–5)
HCT: 32.2 % — ABNORMAL LOW (ref 39.0–52.0)
Hemoglobin: 12 g/dL — ABNORMAL LOW (ref 13.0–17.0)
Lymphs Abs: 1.8 10*3/uL (ref 0.7–4.0)
MCH: 35.2 pg — ABNORMAL HIGH (ref 26.0–34.0)
MCHC: 37.3 g/dL — ABNORMAL HIGH (ref 30.0–36.0)
MCV: 94.4 fL (ref 78.0–100.0)
Monocytes Absolute: 0.6 10*3/uL (ref 0.1–1.0)
Monocytes Relative: 13 % — ABNORMAL HIGH (ref 3–12)

## 2013-07-03 LAB — BASIC METABOLIC PANEL
BUN: 6 mg/dL (ref 6–23)
Creatinine, Ser: 0.65 mg/dL (ref 0.50–1.35)
GFR calc Af Amer: >90 mL/min (ref >90)
GFR calc non Af Amer: >90 mL/min (ref >90)
Glucose, Bld: 117 mg/dL — ABNORMAL HIGH (ref 70–99)

## 2013-07-03 MED ORDER — IRBESARTAN 300 MG PO TABS
300.0000 mg | ORAL_TABLET | Freq: Every day | ORAL | Status: DC
  Administered 2013-07-04 – 2013-07-08 (×5): 300 mg via ORAL
  Filled 2013-07-03 (×5): qty 1

## 2013-07-03 MED ORDER — HYDROCODONE-ACETAMINOPHEN 5-325 MG PO TABS: 1.0000 | ORAL_TABLET | ORAL | Status: DC | PRN

## 2013-07-03 MED ORDER — CALCIUM CARBONATE 1250 (500 CA) MG PO TABS
1250.0000 mg | ORAL_TABLET | Freq: Every day | ORAL | Status: DC
  Administered 2013-07-04 – 2013-07-08 (×5): 1250 mg via ORAL
  Filled 2013-07-03 (×6): qty 1

## 2013-07-03 MED ORDER — ACETAMINOPHEN 325 MG PO TABS: 650.0000 mg | ORAL_TABLET | Freq: Four times a day (QID) | ORAL | Status: DC | PRN

## 2013-07-03 MED ORDER — B COMPLEX-C PO TABS
1.0000 | ORAL_TABLET | Freq: Every day | ORAL | Status: DC
  Administered 2013-07-04 – 2013-07-08 (×5): 1 via ORAL
  Filled 2013-07-03 (×5): qty 1

## 2013-07-03 MED ORDER — ACETAMINOPHEN 650 MG RE SUPP: 650.0000 mg | Freq: Four times a day (QID) | RECTAL | Status: DC | PRN

## 2013-07-03 MED ORDER — ONDANSETRON HCL 4 MG PO TABS: 4.0000 mg | ORAL_TABLET | Freq: Four times a day (QID) | ORAL | Status: DC | PRN

## 2013-07-03 MED ORDER — MORPHINE SULFATE 2 MG/ML IJ SOLN: 1.0000 mg | INTRAMUSCULAR | Status: DC | PRN

## 2013-07-03 MED ORDER — AMLODIPINE BESYLATE 10 MG PO TABS
10.0000 mg | ORAL_TABLET | Freq: Every day | ORAL | Status: DC
  Administered 2013-07-04 – 2013-07-08 (×5): 10 mg via ORAL
  Filled 2013-07-03 (×5): qty 1

## 2013-07-03 MED ORDER — SODIUM CHLORIDE 0.9 % IJ SOLN
3.0000 mL | Freq: Two times a day (BID) | INTRAMUSCULAR | Status: DC
Start: 2013-07-03 — End: 2013-07-08
  Administered 2013-07-04 – 2013-07-08 (×7): 3 mL via INTRAVENOUS

## 2013-07-03 MED ORDER — TAMSULOSIN HCL 0.4 MG PO CAPS
0.4000 mg | ORAL_CAPSULE | Freq: Every day | ORAL | Status: DC
  Administered 2013-07-04 – 2013-07-08 (×5): 0.4 mg via ORAL
  Filled 2013-07-03 (×5): qty 1

## 2013-07-03 MED ORDER — SODIUM CHLORIDE 0.9 % IV SOLN
INTRAVENOUS | Status: DC
  Administered 2013-07-04 – 2013-07-06 (×5): via INTRAVENOUS

## 2013-07-03 MED ORDER — SODIUM CHLORIDE 0.9 % IV BOLUS (SEPSIS)
1000.0000 mL | Freq: Once | INTRAVENOUS | Status: AC
  Administered 2013-07-03: 1000 mL via INTRAVENOUS

## 2013-07-03 MED ORDER — PANTOPRAZOLE SODIUM 40 MG PO TBEC
40.0000 mg | DELAYED_RELEASE_TABLET | Freq: Every day | ORAL | Status: DC
  Administered 2013-07-04 – 2013-07-08 (×5): 40 mg via ORAL
  Filled 2013-07-03 (×5): qty 1

## 2013-07-03 MED ORDER — VITAMIN D3 25 MCG (1000 UNIT) PO TABS
1000.0000 [IU] | ORAL_TABLET | Freq: Every day | ORAL | Status: DC
  Administered 2013-07-04 – 2013-07-08 (×5): 1000 [IU] via ORAL
  Filled 2013-07-03 (×5): qty 1

## 2013-07-03 MED ORDER — ONDANSETRON HCL 4 MG/2ML IJ SOLN: 4.0000 mg | Freq: Four times a day (QID) | INTRAMUSCULAR | Status: DC | PRN

## 2013-07-03 MED ORDER — ATENOLOL 100 MG PO TABS
100.0000 mg | ORAL_TABLET | Freq: Every day | ORAL | Status: DC
  Administered 2013-07-04 – 2013-07-08 (×5): 100 mg via ORAL
  Filled 2013-07-03 (×5): qty 1

## 2013-07-03 MED ORDER — ADULT MULTIVITAMIN W/MINERALS CH
1.0000 | ORAL_TABLET | Freq: Every day | ORAL | Status: DC
  Administered 2013-07-04 – 2013-07-08 (×5): 1 via ORAL
  Filled 2013-07-03 (×5): qty 1

## 2013-07-03 NOTE — H&P (Addendum)
[facility] History and Physical  Anthony Curry ZOX:[id number] DOB: August 07, 1941 DOA: 07/03/2013  Referring physician: ER physician PCP: Lolita Patella, MD   Chief Complaint: fall  HPI:  72 year old male with past medical history of alcohol abuse, hypertension, GERD who presented to [facility] ED status post fall at home. Patient reports drinking beer prior to the fall. Patient reported gait imbalance and subsequent fall but no prodromal symptoms of shortness of breath, chest pain or palpitations. No lightheadedness or loss of consciousness. No abdominal pain or nausea or vomiting. No fever or chills or cough. No repots of blood in stool or urine. No blurry or double vision.  In ED, BP was 114/59, HR 64, T 97.8 F and O2 saturation 100% on room air. CBC revealed hemoglobin of 12. BMP revealed sodium of 119. CT head and cervical spine did not reveal acute intracranial findings. There is a mention of left apical lung nodule larger in size, 8 x 15 mm which needs follow up in future.  Assessment and Plan:  Principal Problem:   *Fall - likely alcohol intoxication - CT head and cervical spine with no acute intracranial findings - check alcohol level --> addendum alcohol level 199 on admission --> CIWA ordered - PT evaluation in am   *Acute alcohol intoxication - CIWA ordered Active Problems:   HTN (hypertension) - may continue Norvasc, atenolol and benicar in am if BP tolerates; meds ordered but may hold if low BP   Hyponatremia - secondary to alcohol abuse - continue IV fluids - follow up BMP in am   Anemia of chronic disease - likely due to history of alcohol abuse - hemoglobin stable on admission at 12.0   Left apical lung nodule - 8 x 15 mm, larger in size compared to prior studies, please note on discharge that it needs to be followed up in near future to exclude the possibility of neoplasm   BPH (benign prostatic hyperplasia) - continue Flomax   GERD (gastroesophageal reflux  disease) - continue protonix  Manson Passey [facility] [phone] Review of Systems:  Constitutional: Negative for fever, chills and malaise/fatigue. Negative for diaphoresis.  HENT: Negative for hearing loss, ear pain, nosebleeds, congestion, sore throat, neck pain, tinnitus and ear discharge.   Eyes: Negative for blurred vision, double vision, photophobia, pain, discharge and redness.  Respiratory: Negative for cough, hemoptysis, sputum production, shortness of breath, wheezing and stridor.   Cardiovascular: Negative for chest pain, palpitations, orthopnea, claudication and leg swelling.  Gastrointestinal: Negative for nausea, vomiting and abdominal pain. Negative for heartburn, constipation, blood in stool and melena.  Genitourinary: Negative for dysuria, urgency, frequency, hematuria and flank pain.  Musculoskeletal: Negative for myalgias, back pain, joint pain and positive for falls.  Skin: Negative for itching and rash.  Neurological: Negative for dizziness and weakness. Negative for tingling, tremors, sensory change, speech change, focal weakness, loss of consciousness and headaches.  Endo/Heme/Allergies: Negative for environmental allergies and polydipsia. Does not bruise/bleed easily.  Psychiatric/Behavioral: Negative for suicidal ideas. The patient is not nervous/anxious.      Past Medical History  Diagnosis Date  . Hypertension   . GERD (gastroesophageal reflux disease)   . Alcohol abuse   . 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 Hemorrhoid surgery    . Ankle arthrodesis  1995    rt fx-hardware in  . Tonsillectomy    . Colonoscopy    . Excision / curettage bone cyst finger  2005  rt thumb  . Total shoulder arthroplasty  02/03/2012    Procedure: TOTAL SHOULDER ARTHROPLASTY;  Surgeon: Eulas Post, MD;  Location: [facility];  Service: Orthopedics;  Laterality: Left;   Social History:  reports that he quit  smoking about 29 years ago. He does not have any smokeless tobacco history on file. He reports that  drinks alcohol. He reports that he does not use illicit drugs.  No Known Allergies  Family History: Family medical history significant for HTN, HLD   Prior to Admission medications   Medication Sig Start Date End Date Taking? Authorizing Provider  amLODipine (NORVASC) 10 MG tablet Take 10 mg by mouth daily.     Yes Historical Provider, MD  atenolol (TENORMIN) 100 MG tablet Take 100 mg by mouth daily.     Yes Historical Provider, MD  B Complex-C (B-COMPLEX WITH VITAMIN C) tablet Take 1 tablet by mouth daily.   Yes Historical Provider, MD  calcium carbonate (OS-CAL) 600 MG TABS Take 600 mg by mouth daily with breakfast.   Yes Historical Provider, MD  cholecalciferol (VITAMIN D) 1000 UNITS tablet Take 1,000 Units by mouth daily.   Yes Historical Provider, MD  Multiple Vitamin (MULTIVITAMIN WITH MINERALS) TABS Take 1 tablet by mouth daily.   Yes Historical Provider, MD  olmesartan (BENICAR) 40 MG tablet Take 40 mg by mouth daily.     Yes Historical Provider, MD  omeprazole (PRILOSEC) 20 MG capsule Take 20 mg by mouth daily.    Yes Historical Provider, MD  Potassium 99 MG TABS Take 1 tablet by mouth daily.   Yes Historical Provider, MD  Tamsulosin HCl (FLOMAX) 0.4 MG CAPS Take 1 capsule (0.4 mg total) by mouth daily. 08/19/12  Yes Osvaldo Human, MD   Physical Exam: Filed Vitals:   07/03/13 1949 07/03/13 2248  BP: 121/56 114/59  Pulse: 64   Temp: 97.9 F (36.6 C) 97.8 F (36.6 C)  TempSrc: Oral Oral  Resp: 17 18  SpO2: 100% 100%    Physical Exam  Constitutional: Appears well-developed and well-nourished. No distress.  HENT: Normocephalic. Right eyebrow laceration Eyes: Conjunctivae and EOM are normal. PERRLA, no scleral icterus.  Neck: Normal ROM. Neck supple. No JVD. No tracheal deviation. No thyromegaly.  CVS: RRR, S1/S2 +, no murmurs, no gallops, no carotid bruit.  Pulmonary:  Effort and breath sounds normal, no stridor, rhonchi, wheezes, rales.  Abdominal: Soft. BS +,  no distension, tenderness, rebound or guarding.  Musculoskeletal: Normal range of motion. No edema and no tenderness.  Lymphadenopathy: No lymphadenopathy noted, cervical, inguinal. Neuro: Alert. Normal reflexes, muscle tone coordination. No cranial nerve deficit. Skin: Skin is warm and dry. No rash noted. Laceration at the area or right eyebrow.  Psychiatric: Normal mood and affect. Behavior, judgment, thought content normal.   Labs on Admission:  Basic Metabolic Panel:  Recent Labs Lab 07/03/13 2035  NA 119*  K 3.7  CL 85*  CO2 21  GLUCOSE 117*  BUN 6  CREATININE 0.65  CALCIUM 8.5   Liver Function Tests: No results found for this basename: AST, ALT, ALKPHOS, BILITOT, PROT, ALBUMIN,  in the last 168 hours No results found for this basename: LIPASE, AMYLASE,  in the last 168 hours No results found for this basename: AMMONIA,  in the last 168 hours CBC:  Recent Labs Lab 07/03/13 2035  WBC 4.4  NEUTROABS 2.0  HGB 12.0*  HCT 32.2*  MCV 94.4  PLT 190   Cardiac Enzymes: No results  found for this basename: CKTOTAL, CKMB, CKMBINDEX, TROPONINI,  in the last 168 hours BNP: No components found with this basename: POCBNP,  CBG: No results found for this basename: GLUCAP,  in the last 168 hours  Radiological Exams on Admission: Ct Head Wo Contrast  07/03/2013   *RADIOLOGY REPORT*  Clinical Data:  FALL HEAD INJURY,neck pain, fall.  CT HEAD WITHOUT CONTRAST CT CERVICAL SPINE WITHOUT CONTRAST  Technique:  Multidetector CT imaging of the head and cervical spine was performed following the standard protocol without IV contrast. Multiplanar CT image reconstructions of the cervical spine were also generated.  Comparison: 10/15/2011 and earlier studies  CT HEAD  Findings: Atherosclerotic and physiologic intracranial calcifications.  Mild atrophy. There is no evidence of acute intracranial  hemorrhage, brain edema, mass lesion, acute infarction,   mass effect, or midline shift. Acute infarct may be inapparent on noncontrast CT.  No other intra-axial abnormalities are seen, and the ventricles and sulci are within normal limits in size and symmetry.   No abnormal extra-axial fluid collections or masses are identified.  No significant calvarial abnormality.  IMPRESSION: 1. Negative for bleed or other acute intracranial process.  CT CERVICAL SPINE  Findings: Straightening of the normal cervical lordosis.  Negative for fracture.  Mild reversal of the normal cervical lordosis. Marked narrowing of all interspaces from C3-T2, with anterior and posterior endplate osteophytes.  Facet and uncovertebral spurring results in foraminal stenosis left C2-3, bilaterally C3-4, right C4- 5, right C5-6, bilaterally C6-7 and C7-T1.  Bilateral carotid calcified plaque.  Left apical lung nodule has enlarged, now 8 x 15 mm.  IMPRESSION:  1.  Negative for fracture or other acute bony abnormality. 2.  Advanced cervical degenerative changes as enumerated above. 3.  Enlargement of left apical pulmonary nodule.  Follow up recommended to exclude developing neoplasm.   Original Report Authenticated By: D. Andria Rhein, MD   Ct Cervical Spine Wo Contrast  07/03/2013   *RADIOLOGY REPORT*  Clinical Data:  FALL HEAD INJURY,neck pain, fall.  CT HEAD WITHOUT CONTRAST CT CERVICAL SPINE WITHOUT CONTRAST  Technique:  Multidetector CT imaging of the head and cervical spine was performed following the standard protocol without IV contrast. Multiplanar CT image reconstructions of the cervical spine were also generated.  Comparison: 10/15/2011 and earlier studies  CT HEAD  Findings: Atherosclerotic and physiologic intracranial calcifications.  Mild atrophy. There is no evidence of acute intracranial hemorrhage, brain edema, mass lesion, acute infarction,   mass effect, or midline shift. Acute infarct may be inapparent on noncontrast CT.  No  other intra-axial abnormalities are seen, and the ventricles and sulci are within normal limits in size and symmetry.   No abnormal extra-axial fluid collections or masses are identified.  No significant calvarial abnormality.  IMPRESSION: 1. Negative for bleed or other acute intracranial process.  CT CERVICAL SPINE  Findings: Straightening of the normal cervical lordosis.  Negative for fracture.  Mild reversal of the normal cervical lordosis. Marked narrowing of all interspaces from C3-T2, with anterior and posterior endplate osteophytes.  Facet and uncovertebral spurring results in foraminal stenosis left C2-3, bilaterally C3-4, right C4- 5, right C5-6, bilaterally C6-7 and C7-T1.  Bilateral carotid calcified plaque.  Left apical lung nodule has enlarged, now 8 x 15 mm.  IMPRESSION:  1.  Negative for fracture or other acute bony abnormality. 2.  Advanced cervical degenerative changes as enumerated above. 3.  Enlargement of left apical pulmonary nodule.  Follow up recommended to exclude developing neoplasm.   Original  Report Authenticated By: D. Andria Rhein, MD    Code Status: Full Family Communication: Pt at bedside Disposition Plan: Admit for further evaluation  Manson Passey, MD  [facility] Pager [phone]  If 7PM-7AM, please contact night-coverage [email or web address] Password [email or web address] 07/03/2013, 11:01 PM

## 2013-07-03 NOTE — ED Notes (Signed)
Per [facility] hold pt in ER until after 11pm

## 2013-07-03 NOTE — ED Provider Notes (Signed)
Medical screening examination/treatment/procedure(s) were conducted as a shared visit with non-physician practitioner(s) and myself.  I personally evaluated the patient during the encounter.  71yM presenting after fall. Hyponatremic. Alcoholic and chronically low, but not to this degree. Admit for gentle correction.    Raeford Razor, MD 07/03/13 2220

## 2013-07-03 NOTE — ED Notes (Signed)
Marlon Pel PA notified of sodium 119

## 2013-07-03 NOTE — ED Provider Notes (Signed)
CSN: [id number]     Arrival date & time 07/03/13  1944 History     First MD Initiated Contact with Patient 07/03/13 2011     Chief Complaint  Patient presents with  . Fall  . Head Injury   (Consider location/radiation/quality/duration/timing/severity/associated sxs/prior Treatment) HPI  Anthony Curry is a 72 y.o.male with a significant PMH of hypertension,. GERD, alcohol abuse, arthritis of the shoulder presents to the ER with complaints of fall with head lac. He is here with his wife. He had had a "couple beers" and stood up really fast accidentally loosing his balance and hitting his head on the edge of the table. He has a laceration to the right temple that is not actively bleeding. Pt tells me he does not have any pain or weakness. Wife says he is acting baseline.   Past Medical History  Diagnosis Date  . Hypertension   . GERD (gastroesophageal reflux disease)   . Alcohol abuse   . 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 Hemorrhoid surgery    . Ankle arthrodesis  1995    rt fx-hardware in  . Tonsillectomy    . Colonoscopy    . Excision / curettage bone cyst finger  2005    rt thumb  . 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 No pertinent family history. History  Substance Use Topics  . Smoking status: Former Smoker    Quit date: 01/27/1984  . Smokeless tobacco: Not on file  . Alcohol Use: 0.0 oz/week    4-5 Cans of beer per week     Comment: admits to 4-6 beers daily    Review of Systems  Skin: Positive for wound.  Neurological: Negative for speech difficulty, weakness, light-headedness and headaches.  All other systems reviewed and are negative.    Allergies  Review of patient's allergies indicates no known allergies.  Home  Medications   Current Outpatient Rx  Name  Route  Sig  Dispense  Refill  . amLODipine (NORVASC) 10 MG tablet   Oral   Take 10 mg by mouth daily.           [name] atenolol (TENORMIN) 100 MG tablet   Oral   Take 100 mg by mouth daily.           . B Complex-C (B-COMPLEX WITH VITAMIN C) tablet   Oral   Take 1 tablet by mouth daily.         . calcium carbonate (OS-CAL) 600 MG TABS   Oral   Take 600 mg by mouth daily with breakfast.         . cholecalciferol (VITAMIN D) 1000 UNITS tablet   Oral   Take 1,000 Units by mouth daily.         . Multiple Vitamin (MULTIVITAMIN WITH MINERALS) TABS   Oral   Take 1 tablet by mouth daily.         [name] olmesartan (BENICAR) 40 MG tablet   Oral   Take 40 mg by mouth daily.           [name] omeprazole (PRILOSEC) 20 MG capsule   Oral   Take 20 mg by mouth daily.          . Potassium 99 MG TABS   Oral  Take 1 tablet by mouth daily.         . Tamsulosin HCl (FLOMAX) 0.4 MG CAPS   Oral   Take 1 capsule (0.4 mg total) by mouth daily.   5 capsule   0    BP 121/56  Pulse 64  Temp(Src) 97.9 F (36.6 C) (Oral)  Resp 17  SpO2 100% Physical Exam  Nursing note and vitals reviewed. Constitutional: He is oriented to person, place, and time. He appears well-developed and well-nourished. No distress.  HENT:  Head: Normocephalic. Head is with contusion and with laceration. Head is without raccoon's eyes, without Battle's sign, without abrasion, without right periorbital erythema and without left periorbital erythema. Hair is normal.    Eyes: Pupils are equal, round, and reactive to light.  Neck: Normal range of motion. Neck supple.  Cardiovascular: Normal rate and regular rhythm.   Pulmonary/Chest: Effort normal.  Abdominal: Soft.  Neurological: He is alert and oriented to person, place, and time. GCS eye subscore is 4. GCS verbal subscore is 5. GCS motor subscore is 6.  Skin: Skin is warm and dry.    ED Course   Procedures  (including critical care time)  Labs Reviewed  CBC WITH DIFFERENTIAL - Abnormal; Notable for the following:    RBC 3.41 (*)    Hemoglobin 12.0 (*)    HCT 32.2 (*)    MCH 35.2 (*)    MCHC 37.3 (*)    Monocytes Relative 13 (*)    All other components within normal limits  BASIC METABOLIC PANEL - Abnormal; Notable for the following:    Sodium 119 (*)    Chloride 85 (*)    Glucose, Bld 117 (*)    All other components within normal limits  ETHANOL   Ct Head Wo Contrast  07/03/2013   *RADIOLOGY REPORT*  Clinical Data:  FALL HEAD INJURY,neck pain, fall.  CT HEAD WITHOUT CONTRAST CT CERVICAL SPINE WITHOUT CONTRAST  Technique:  Multidetector CT imaging of the head and cervical spine was performed following the standard protocol without IV contrast. Multiplanar CT image reconstructions of the cervical spine were also generated.  Comparison: 10/15/2011 and earlier studies  CT HEAD  Findings: Atherosclerotic and physiologic intracranial calcifications.  Mild atrophy. There is no evidence of acute intracranial hemorrhage, brain edema, mass lesion, acute infarction,   mass effect, or midline shift. Acute infarct may be inapparent on noncontrast CT.  No other intra-axial abnormalities are seen, and the ventricles and sulci are within normal limits in size and symmetry.   No abnormal extra-axial fluid collections or masses are identified.  No significant calvarial abnormality.  IMPRESSION: 1. Negative for bleed or other acute intracranial process.  CT CERVICAL SPINE  Findings: Straightening of the normal cervical lordosis.  Negative for fracture.  Mild reversal of the normal cervical lordosis. Marked narrowing of all interspaces from C3-T2, with anterior and posterior endplate osteophytes.  Facet and uncovertebral spurring results in foraminal stenosis left C2-3, bilaterally C3-4, right C4- 5, right C5-6, bilaterally C6-7 and C7-T1.  Bilateral carotid calcified plaque.  Left apical lung nodule has enlarged, now 8  x 15 mm.  IMPRESSION:  1.  Negative for fracture or other acute bony abnormality. 2.  Advanced cervical degenerative changes as enumerated above. 3.  Enlargement of left apical pulmonary nodule.  Follow up recommended to exclude developing neoplasm.   Original Report Authenticated By: D. Andria Rhein, MD   Ct Cervical Spine Wo Contrast  07/03/2013   *RADIOLOGY REPORT*  Clinical  Data:  FALL HEAD INJURY,neck pain, fall.  CT HEAD WITHOUT CONTRAST CT CERVICAL SPINE WITHOUT CONTRAST  Technique:  Multidetector CT imaging of the head and cervical spine was performed following the standard protocol without IV contrast. Multiplanar CT image reconstructions of the cervical spine were also generated.  Comparison: 10/15/2011 and earlier studies  CT HEAD  Findings: Atherosclerotic and physiologic intracranial calcifications.  Mild atrophy. There is no evidence of acute intracranial hemorrhage, brain edema, mass lesion, acute infarction,   mass effect, or midline shift. Acute infarct may be inapparent on noncontrast CT.  No other intra-axial abnormalities are seen, and the ventricles and sulci are within normal limits in size and symmetry.   No abnormal extra-axial fluid collections or masses are identified.  No significant calvarial abnormality.  IMPRESSION: 1. Negative for bleed or other acute intracranial process.  CT CERVICAL SPINE  Findings: Straightening of the normal cervical lordosis.  Negative for fracture.  Mild reversal of the normal cervical lordosis. Marked narrowing of all interspaces from C3-T2, with anterior and posterior endplate osteophytes.  Facet and uncovertebral spurring results in foraminal stenosis left C2-3, bilaterally C3-4, right C4- 5, right C5-6, bilaterally C6-7 and C7-T1.  Bilateral carotid calcified plaque.  Left apical lung nodule has enlarged, now 8 x 15 mm.  IMPRESSION:  1.  Negative for fracture or other acute bony abnormality. 2.  Advanced cervical degenerative changes as enumerated above.  3.  Enlargement of left apical pulmonary nodule.  Follow up recommended to exclude developing neoplasm.   Original Report Authenticated By: D. Andria Rhein, MD   1. Facial laceration, initial encounter   2. Pulmonary nodule seen on imaging study   3. Hyponatremia     MDM  LACERATION REPAIR Performed by: Dorthula Matas Authorized by: Dorthula Matas Consent: Verbal consent obtained. Risks and benefits: risks, benefits and alternatives were discussed Consent given by: patient Patient identity confirmed: provided demographic data Prepped and Draped in normal sterile fashion Wound explored  Laceration Location: right eyebwo  Laceration Length: 0.5 cm  No Foreign Bodies seen or palpated  Anesthesia: local infiltration  Local anesthetic: lidocaine 2% wo epinephrine  Anesthetic total: 2 ml  Irrigation method: syringe Amount of cleaning: standard  Skin closure: sutures  Number of sutures: 2  Technique: simple interrupted  Patient tolerance: Patient tolerated the procedure well with no immediate complications.  Images are normal for new intracranial abnormality however a pulmonary nodule as indicated and is larger than previous images. On laboratory results patient's sodium is noted to be 119. After discussing with my attending he feels the patient needs to be admitted for the sodium as the patient's baseline is between 125 and 1:30.  [facility], inpatient, tele, Team [id number]   Dorthula Matas, [place] 07/03/13 2225

## 2013-07-03 NOTE — ED Notes (Signed)
Pt fell and hit head on the corner of a table, bleeding controlled, laceration above right eyebrow

## 2013-07-04 DIAGNOSIS — W19XXXA Unspecified fall, initial encounter: Secondary | ICD-10-CM

## 2013-07-04 DIAGNOSIS — N400 Enlarged prostate without lower urinary tract symptoms: Secondary | ICD-10-CM

## 2013-07-04 LAB — COMPREHENSIVE METABOLIC PANEL
AST: 30 U/L (ref 0–37)
BUN: 5 mg/dL — ABNORMAL LOW (ref 6–23)
CO2: 22 mEq/L (ref 19–32)
Chloride: 92 mEq/L — ABNORMAL LOW (ref 96–112)
Creatinine, Ser: 0.63 mg/dL (ref 0.50–1.35)
GFR calc Af Amer: >90 mL/min (ref >90)
GFR calc non Af Amer: >90 mL/min (ref >90)
Glucose, Bld: 83 mg/dL (ref 70–99)
Total Bilirubin: 0.5 mg/dL (ref 0.3–1.2)

## 2013-07-04 LAB — CBC WITH DIFFERENTIAL/PLATELET
Basophils Absolute: 0 10*3/uL (ref 0.0–0.1)
Basophils Relative: 1 % (ref 0–1)
HCT: 31.8 % — ABNORMAL LOW (ref 39.0–52.0)
MCHC: 37.7 g/dL — ABNORMAL HIGH (ref 30.0–36.0)
Monocytes Absolute: 0.6 10*3/uL (ref 0.1–1.0)
Neutro Abs: 1.9 10*3/uL (ref 1.7–7.7)
RDW: 13.2 % (ref 11.5–15.5)

## 2013-07-04 LAB — MAGNESIUM: Magnesium: 1.5 mg/dL (ref 1.5–2.5)

## 2013-07-04 LAB — PROTIME-INR: INR: 0.99 (ref 0.00–1.49)

## 2013-07-04 LAB — PHOSPHORUS: Phosphorus: 3 mg/dL (ref 2.3–4.6)

## 2013-07-04 MED ORDER — ENSURE COMPLETE PO LIQD
237.0000 mL | ORAL | Status: DC
  Administered 2013-07-04 – 2013-07-07 (×4): 237 mL via ORAL

## 2013-07-04 MED ORDER — ZOLPIDEM TARTRATE 5 MG PO TABS
5.0000 mg | ORAL_TABLET | Freq: Every evening | ORAL | Status: DC | PRN
  Administered 2013-07-05: 5 mg via ORAL
  Filled 2013-07-04: qty 1

## 2013-07-04 NOTE — Progress Notes (Addendum)
INITIAL NUTRITION ASSESSMENT  DOCUMENTATION CODES Per approved criteria  -Not Applicable   INTERVENTION: Provide Ensure Complete once daily Continue Multivitamin with minerals daily and Vitamin B-complex daily Provided "Sobriety Nutrition Therapy" from the Academy of Nutrition and Dietetics and encouraged healthful eating Recommend providing pt with sleeping aid  NUTRITION DIAGNOSIS: Excessive alcohol intake related to lack of value for behavior change as evidenced by pt's report of drinking 5-6 beers daily.   Goal: Pt to meet >/= 90% of their estimated nutrition needs   Monitor:  PO intake Weights Labs  Reason for Assessment: malnutrition screening tool, score of 3  72 y.o. male  Admitting Dx: Fall  ASSESSMENT: 72 year old male with past medical history of alcohol abuse, hypertension, GERD who presented to [facility] ED status post fall at home. Patient reports drinking beer prior to the fall.   Pt reports that he does not have an appetite so, he typically only eats one meal and one snack daily. Pt reports drinking 5-6 beers daily. Pt thinks he has lost weight but, he does not know how much he usually weighs. Asked pt if he would like nutrition education to improve nutrient intake and help decrease temptation to drink and pt stated yes. RD provided "Sobriety Nutrition Therapy" from the Academy of Nutrition and Dietetics. Encouraged pt to eat more consistent meals and snacks throughout the day. Discussed easy healthy snacks for pt to keep around the home. Encouraged pt to take a daily multivitamin and to stay hydrated with sugar-free caffeine-free beverages. Also encouraged pt to try and get 8 hours of sleep nightly; pt states he has a lot of trouble sleeping- he used to take a medication to aid sleep but, he doesn't anymore. Encouraged pt to discuss this issue with MD.   Height: Ht Readings from Last 1 Encounters:  07/03/13 6\' 1"  (1.854 m)    Weight: Wt Readings from Last 1  Encounters:  07/04/13 155 lb 6.8 oz (70.5 kg)    Ideal Body Weight: 184 lbs  % Ideal Body Weight: 84%  Wt Readings from Last 10 Encounters:  07/04/13 155 lb 6.8 oz (70.5 kg)  02/03/12 150 lb (68.04 kg)  02/03/12 150 lb (68.04 kg)  10/17/11 160 lb 15 oz (73 kg)    Usual Body Weight: unknown  % Usual Body Weight: NA  BMI:  Body mass index is 20.51 kg/(m^2).  Estimated Nutritional Needs: Kcal: 1800-2000 Protein: 70-85 grams  Fluid: 2.1 L  Skin: non-pitting RLE edema, laceration  Diet Order: General  EDUCATION NEEDS: -Education needs addressed   Intake/Output Summary (Last 24 hours) at 07/04/13 1258 Last data filed at 07/04/13 0900  Gross per 24 hour  Intake   1645 ml  Output   1150 ml  Net    495 ml    Last BM: 7/30   Labs:   Recent Labs Lab 07/03/13 2035 07/04/13 0220  NA 119* 126*  K 3.7 3.7  CL 85* 92*  CO2 21 22  BUN 6 5*  CREATININE 0.65 0.63  CALCIUM 8.5 8.1*  MG  --  1.5  PHOS  --  3.0  GLUCOSE 117* 83    CBG (last 3)   Recent Labs  07/04/13 0750  GLUCAP 74    Scheduled Meds: . amLODipine  10 mg Oral Daily  . atenolol  100 mg Oral Daily  . B-complex with vitamin C  1 tablet Oral Daily  . calcium carbonate  1,250 mg Oral Q breakfast  . cholecalciferol  1,000 Units Oral Daily  . irbesartan  300 mg Oral Daily  . multivitamin with minerals  1 tablet Oral Daily  . pantoprazole  40 mg Oral Daily  . sodium chloride  3 mL Intravenous Q12H  . tamsulosin  0.4 mg Oral Daily    Continuous Infusions: . sodium chloride 75 mL/hr at 07/04/13 0003    Past Medical History  Diagnosis Date  . Hypertension   . GERD (gastroesophageal reflux disease)   . Alcohol abuse   . 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 Hemorrhoid surgery    . Ankle arthrodesis  1995    rt fx-hardware in  . Tonsillectomy    . Colonoscopy    . Excision / curettage bone cyst finger  2005    rt thumb  .  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

## 2013-07-04 NOTE — ED Provider Notes (Signed)
Medical screening examination/treatment/procedure(s) were conducted as a shared visit with non-physician practitioner(s) and myself.  I personally evaluated the patient during the encounter.  Please see other note.   Raeford Razor, MD 07/04/13 [phone]

## 2013-07-04 NOTE — Evaluation (Signed)
Physical Therapy Evaluation Patient Details Name: Anthony Curry MRN: [id number] DOB: 09/17/1941 Today's Date: 07/04/2013 Time: 4098-1191 PT Time Calculation (min): 14 min  PT Assessment / Plan / Recommendation History of Present Illness  Pt presents following fall at home and history of ETOH abuse.  Resides at home with wife, who can provide mostly 24/7 care.   Clinical Impression  Pt requires supervision for all mobility tasks, however feel he will benefit from PT following in acute venue for mild balance issues with ambulation.  PT recommends no follow up for pt at D/C.     PT Assessment  Patient needs continued PT services    Follow Up Recommendations  No PT follow up    Does the patient have the potential to tolerate intense rehabilitation      Barriers to Discharge        Equipment Recommendations  None recommended by PT    Recommendations for Other Services     Frequency Min 3X/week    Precautions / Restrictions Precautions Precautions: Fall Restrictions Weight Bearing Restrictions: No   Pertinent Vitals/Pain No pain      Mobility  Bed Mobility Bed Mobility: Supine to Sit Supine to Sit: 6: Modified independent (Device/Increase time) Details for Bed Mobility Assistance: increased time Transfers Transfers: Sit to Stand;Stand to Sit Sit to Stand: 5: Supervision;From bed Stand to Sit: 5: Supervision;To chair/3-in-1 Details for Transfer Assistance: Supervision for safety Ambulation/Gait Ambulation/Gait Assistance: 5: Supervision Ambulation Distance (Feet): 200 Feet Assistive device:  (pushed IV pole) Ambulation/Gait Assistance Details: Pt only mildly unsteady using IV pole for minor support.  Cues for safety when negotiating around objects in hallway.  Gait Pattern: Step-through pattern;Decreased stride length Gait velocity: decreased    Exercises     PT Diagnosis: Difficulty walking;Generalized weakness  PT Problem List: Decreased activity  tolerance;Decreased balance;Decreased mobility;Decreased strength PT Treatment Interventions: Gait training;Functional mobility training;Therapeutic activities;Therapeutic exercise;Balance training;Neuromuscular re-education;Patient/family education     PT Goals(Current goals can be found in the care plan section) Acute Rehab PT Goals Patient Stated Goal: to return home tomorrow PT Goal Formulation: With patient Time For Goal Achievement: 07/11/13 Potential to Achieve Goals: Good  Visit Information  Last PT Received On: 07/04/13 Assistance Needed: +1 History of Present Illness: Pt presents following fall at home and history of ETOH abuse.  Resides at home with wife, who can provide mostly 24/7 care.        Prior Functioning  Home Living Family/patient expects to be discharged to:: Private residence Living Arrangements: Spouse/significant other;Children Available Help at Discharge: Family;Available PRN/intermittently Type of Home: House Home Access: Level entry Home Layout: One level Home Equipment: Cane - single point Prior Function Level of Independence: Independent Comments: only used cane intermittently at home Communication Communication: No difficulties    Cognition  Cognition Arousal/Alertness: Awake/alert Behavior During Therapy: WFL for tasks assessed/performed Overall Cognitive Status: Within Functional Limits for tasks assessed    Extremity/Trunk Assessment Lower Extremity Assessment Lower Extremity Assessment: Overall WFL for tasks assessed Cervical / Trunk Assessment Cervical / Trunk Assessment: Normal   Balance    End of Session PT - End of Session Activity Tolerance: Patient tolerated treatment well Patient left: in chair;with call bell/phone within reach;with family/visitor present Nurse Communication: Mobility status  GP     Vista Deck 07/04/2013, 2:13 PM

## 2013-07-04 NOTE — Progress Notes (Signed)
[facility] PROGRESS NOTE  Anthony Curry ZOX:[id number] DOB: 18-Feb-1941 DOA: 07/03/2013 PCP: Lolita Patella, MD  Assessment/Plan: Principal Problem:   Fall Active Problems:   HTN (hypertension)   Alcohol abuse   Hyponatremia   Anemia of chronic disease   BPH (benign prostatic hyperplasia)   GERD (gastroesophageal reflux disease)   Alcohol intoxication    1. Fall: Patient presented following a fall. Fortunately, he sustained no bony injuries. Etiology is multi-factorial, secondary to acute ETOH intoxication, dehydration/volume depletion and profound hyponatremia. CT head and cervical spine showed no acute findings. Managing as outlined below. For PT/OT.  2. Chronic ETOH AbuseAcute alcohol intoxication: Patient admits to drinking about 5 beers a day. Alcohol level was 199 on admission, consistent with acute alcoholic intoxication. Managing with iv fluids, vitamin supplements and CIWA protocol. No clinical features of withdrawal, so far.  3. Hyponatremia: At presentation, patient was noted to have a profound hyponatremia of 119. Likely culprits are ETOH abuse, on the basis of [place]ia. Dehydration, however, has to be considered. Managing with iv NS, and today, sodium has improved at 126. Following lytes. 4. HTN (hypertension): BP appears reasonably controlled at this time. Continued on pre-admission antihypertensives.  5. Anemia of chronic disease: HB is reasonable/Stable. HB 12.0 on admission.  6. Left apical lung nodule: Imaging studies revealed 8 x 15 mm left apical lung nodule, larger in size compared to prior studies. Per patient and family, this has been documented over the past 10 years or so. Be that as it may, due to interval change in size, follow up imaging by PMD appears indicated, and is so recommended.  7. BPH (benign prostatic hyperplasia): Stable/Asymptomatic. On Flomax  8. GERD (gastroesophageal reflux disease): Not problematic on PPI.    Code Status: Full  Code.  Family Communication:  Disposition Plan: To be determined.    Brief narrative: 72 year old male with past medical history of OA, s/p left shoulder arthroplasty, right ankle arthrodesis, alcohol abuse, hypertension, GERD, presenting to [facility] ED status post fall at home. Patient reports drinking beer, prior to the fall. Patient reported gait imbalance and subsequent fall but no prodromal symptoms of shortness of breath, chest pain or palpitations. No lightheadedness or loss of consciousness. No abdominal pain or nausea or vomiting. No fever or chills or cough. No reports of blood in stool or urine. No blurry or double vision. In ED, BP was 114/59, HR 64, T 97.8 F and O2 saturation 100% on room air. CBC revealed hemoglobin of 12. BMP revealed sodium of 119. CT head and cervical spine did not reveal acute intracranial findings. There is a mention of left apical lung nodule larger in size, 8 x 15 mm which needs follow up in future. Admitted for further management.    Consultants:  N/A.   Procedures:  Head CT scan.   Cervical Spine CT.   Antibiotics:  N/A.   HPI/Subjective: No new issues.   Objective: Vital signs in last 24 hours: Temp:  [97.8 F (36.6 C)-98.4 F (36.9 C)] 98.4 F (36.9 C) (07/31 0525) Pulse Rate:  [63-69] 63 (07/31 0525) Resp:  [16-18] 16 (07/31 0525) BP: (112-127)/(56-59) 127/59 mmHg (07/31 0525) SpO2:  [100 %] 100 % (07/31 0525) Weight:  [70.5 kg (155 lb 6.8 oz)-72 kg (158 lb 11.7 oz)] 70.5 kg (155 lb 6.8 oz) (07/31 0525) Weight change:  Last BM Date: 07/03/13  Intake/Output from previous day: 07/30 0701 - 07/31 0700 In: 1405 [I.V.:1405] Out: 950 [Urine:950] Total I/O In: 240 [P.O.:240]  Out: 200 [Urine:200]   Physical Exam: General: Comfortable, alert, communicative, fully oriented, not short of breath at rest.  HEENT:  No clinical pallor, no jaundice, no conjunctival injection or discharge. Hydration is fair.  NECK:  Supple, JVP not seen, no  carotid bruits, no palpable lymphadenopathy, no palpable goiter. CHEST:  Clinically clear to auscultation, no wheezes, no crackles. HEART:  Sounds 1 and 2 heard, normal, regular, no murmurs. ABDOMEN:  Full, soft, non-tender, no palpable organomegaly, no palpable masses, normal bowel sounds. GENITALIA:  Not examined. LOWER EXTREMITIES:  No pitting edema, palpable peripheral pulses. MUSCULOSKELETAL SYSTEM:  Generalized osteoarthritic changes, otherwise, normal. CENTRAL NERVOUS SYSTEM:  No focal neurologic deficit on gross examination.  Lab Results:  Recent Labs  07/03/13 2035 07/04/13 0220  WBC 4.4 4.4  HGB 12.0* 12.0*  HCT 32.2* 31.8*  PLT 190 178    Recent Labs  07/03/13 2035 07/04/13 0220  NA 119* 126*  K 3.7 3.7  CL 85* 92*  CO2 21 22  GLUCOSE 117* 83  BUN 6 5*  CREATININE 0.65 0.63  CALCIUM 8.5 8.1*   No results found for this or any previous visit (from the past 240 hour(s)).   Studies/Results: Ct Head Wo Contrast  07/03/2013   *RADIOLOGY REPORT*  Clinical Data:  FALL HEAD INJURY,neck pain, fall.  CT HEAD WITHOUT CONTRAST CT CERVICAL SPINE WITHOUT CONTRAST  Technique:  Multidetector CT imaging of the head and cervical spine was performed following the standard protocol without IV contrast. Multiplanar CT image reconstructions of the cervical spine were also generated.  Comparison: 10/15/2011 and earlier studies  CT HEAD  Findings: Atherosclerotic and physiologic intracranial calcifications.  Mild atrophy. There is no evidence of acute intracranial hemorrhage, brain edema, mass lesion, acute infarction,   mass effect, or midline shift. Acute infarct may be inapparent on noncontrast CT.  No other intra-axial abnormalities are seen, and the ventricles and sulci are within normal limits in size and symmetry.   No abnormal extra-axial fluid collections or masses are identified.  No significant calvarial abnormality.  IMPRESSION: 1. Negative for bleed or other acute intracranial  process.  CT CERVICAL SPINE  Findings: Straightening of the normal cervical lordosis.  Negative for fracture.  Mild reversal of the normal cervical lordosis. Marked narrowing of all interspaces from C3-T2, with anterior and posterior endplate osteophytes.  Facet and uncovertebral spurring results in foraminal stenosis left C2-3, bilaterally C3-4, right C4- 5, right C5-6, bilaterally C6-7 and C7-T1.  Bilateral carotid calcified plaque.  Left apical lung nodule has enlarged, now 8 x 15 mm.  IMPRESSION:  1.  Negative for fracture or other acute bony abnormality. 2.  Advanced cervical degenerative changes as enumerated above. 3.  Enlargement of left apical pulmonary nodule.  Follow up recommended to exclude developing neoplasm.   Original Report Authenticated By: D. Andria Rhein, MD   Ct Cervical Spine Wo Contrast  07/03/2013   *RADIOLOGY REPORT*  Clinical Data:  FALL HEAD INJURY,neck pain, fall.  CT HEAD WITHOUT CONTRAST CT CERVICAL SPINE WITHOUT CONTRAST  Technique:  Multidetector CT imaging of the head and cervical spine was performed following the standard protocol without IV contrast. Multiplanar CT image reconstructions of the cervical spine were also generated.  Comparison: 10/15/2011 and earlier studies  CT HEAD  Findings: Atherosclerotic and physiologic intracranial calcifications.  Mild atrophy. There is no evidence of acute intracranial hemorrhage, brain edema, mass lesion, acute infarction,   mass effect, or midline shift. Acute infarct may be inapparent on noncontrast CT.  No other intra-axial abnormalities are seen, and the ventricles and sulci are within normal limits in size and symmetry.   No abnormal extra-axial fluid collections or masses are identified.  No significant calvarial abnormality.  IMPRESSION: 1. Negative for bleed or other acute intracranial process.  CT CERVICAL SPINE  Findings: Straightening of the normal cervical lordosis.  Negative for fracture.  Mild reversal of the normal cervical  lordosis. Marked narrowing of all interspaces from C3-T2, with anterior and posterior endplate osteophytes.  Facet and uncovertebral spurring results in foraminal stenosis left C2-3, bilaterally C3-4, right C4- 5, right C5-6, bilaterally C6-7 and C7-T1.  Bilateral carotid calcified plaque.  Left apical lung nodule has enlarged, now 8 x 15 mm.  IMPRESSION:  1.  Negative for fracture or other acute bony abnormality. 2.  Advanced cervical degenerative changes as enumerated above. 3.  Enlargement of left apical pulmonary nodule.  Follow up recommended to exclude developing neoplasm.   Original Report Authenticated By: D. Andria Rhein, MD    Medications: Scheduled Meds: . amLODipine  10 mg Oral Daily  . atenolol  100 mg Oral Daily  . B-complex with vitamin C  1 tablet Oral Daily  . calcium carbonate  1,250 mg Oral Q breakfast  . cholecalciferol  1,000 Units Oral Daily  . irbesartan  300 mg Oral Daily  . multivitamin with minerals  1 tablet Oral Daily  . pantoprazole  40 mg Oral Daily  . sodium chloride  3 mL Intravenous Q12H  . tamsulosin  0.4 mg Oral Daily   Continuous Infusions: . sodium chloride 75 mL/hr at 07/04/13 0003   PRN Meds:.acetaminophen, acetaminophen, HYDROcodone-acetaminophen, morphine injection, ondansetron (ZOFRAN) IV, ondansetron    LOS: 1 day   Jessicah Croll,CHRISTOPHER  [facility] Pager [phone]. If 8PM-8AM, please contact night-coverage at [email or web address], password [facility] 07/04/2013, 10:39 AM  LOS: 1 day

## 2013-07-04 NOTE — Care Management Note (Addendum)
    Page 1 of 1   07/08/2013     2:15:09 PM   CARE MANAGEMENT NOTE 07/08/2013  Patient:  Anthony Curry, Anthony Curry   Account Number:  [id number]  Date Initiated:  07/04/2013  Documentation initiated by:  Lanier Clam  Subjective/Objective Assessment:   ADMITTED W/FALL.QI:ONGE.     Action/Plan:   FROM HOME W/SPOUSE.HAS PCP,PHARMACY.   Anticipated DC Date:  07/08/2013   Anticipated DC Plan:  HOME/SELF CARE      DC Planning Services  CM consult      Choice offered to / List presented to:             Status of service:  Completed, signed off Medicare Important Message given?   (If response is "NO", the following Medicare IM given date fields will be blank) Date Medicare IM given:   Date Additional Medicare IM given:    Discharge Disposition:  HOME/SELF CARE  Per UR Regulation:  Reviewed for med. necessity/level of care/duration of stay  If discussed at Long Length of Stay Meetings, dates discussed:    Comments:  07/08/13 Sudie Bandel RN,BSN NCM [phone] D/C HOME NO ORDERS OR NEEDS.  07/04/13 Dawnetta Copenhaver RN,BSN NCM [phone]

## 2013-07-05 DIAGNOSIS — S0180XA Unspecified open wound of other part of head, initial encounter: Secondary | ICD-10-CM | POA: Diagnosis not present

## 2013-07-05 DIAGNOSIS — R911 Solitary pulmonary nodule: Secondary | ICD-10-CM | POA: Diagnosis not present

## 2013-07-05 DIAGNOSIS — I10 Essential (primary) hypertension: Secondary | ICD-10-CM

## 2013-07-05 DIAGNOSIS — E871 Hypo-osmolality and hyponatremia: Secondary | ICD-10-CM | POA: Diagnosis not present

## 2013-07-05 LAB — COMPREHENSIVE METABOLIC PANEL
BUN: 8 mg/dL (ref 6–23)
CO2: 28 mEq/L (ref 19–32)
Calcium: 9 mg/dL (ref 8.4–10.5)
Creatinine, Ser: 0.59 mg/dL (ref 0.50–1.35)
GFR calc Af Amer: >90 mL/min (ref >90)
GFR calc non Af Amer: >90 mL/min (ref >90)
Glucose, Bld: 105 mg/dL — ABNORMAL HIGH (ref 70–99)

## 2013-07-05 LAB — CBC
Hemoglobin: 13.1 g/dL (ref 13.0–17.0)
MCH: 34.9 pg — ABNORMAL HIGH (ref 26.0–34.0)
MCV: 95.5 fL (ref 78.0–100.0)
RBC: 3.75 MIL/uL — ABNORMAL LOW (ref 4.22–5.81)

## 2013-07-05 LAB — OSMOLALITY: Osmolality: 262 mOsm/kg — ABNORMAL LOW (ref 275–300)

## 2013-07-05 MED ORDER — LORAZEPAM 1 MG PO TABS
1.0000 mg | ORAL_TABLET | Freq: Four times a day (QID) | ORAL | Status: AC | PRN
  Administered 2013-07-05 – 2013-07-07 (×4): 1 mg via ORAL
  Filled 2013-07-05 (×4): qty 1

## 2013-07-05 MED ORDER — VITAMIN B-1 100 MG PO TABS
100.0000 mg | ORAL_TABLET | Freq: Every day | ORAL | Status: DC
  Administered 2013-07-05 – 2013-07-08 (×4): 100 mg via ORAL
  Filled 2013-07-05 (×4): qty 1

## 2013-07-05 MED ORDER — FOLIC ACID 1 MG PO TABS
1.0000 mg | ORAL_TABLET | Freq: Every day | ORAL | Status: DC
  Administered 2013-07-05 – 2013-07-08 (×4): 1 mg via ORAL
  Filled 2013-07-05 (×4): qty 1

## 2013-07-05 MED ORDER — THIAMINE HCL 100 MG/ML IJ SOLN
100.0000 mg | Freq: Every day | INTRAMUSCULAR | Status: DC
  Filled 2013-07-05 (×3): qty 1

## 2013-07-05 MED ORDER — LORAZEPAM 2 MG/ML IJ SOLN: 1.0000 mg | Freq: Four times a day (QID) | INTRAMUSCULAR | Status: AC | PRN

## 2013-07-05 MED ORDER — ADULT MULTIVITAMIN W/MINERALS CH
1.0000 | ORAL_TABLET | Freq: Every day | ORAL | Status: DC
  Filled 2013-07-05: qty 1

## 2013-07-05 NOTE — Progress Notes (Signed)
Patient requested a shower. RN notified the PCP on call for an order for a shower.  Order was declined. Patient was notified.

## 2013-07-05 NOTE — Progress Notes (Signed)
[facility] PROGRESS NOTE  Yuri Fana ZOX:[id number] DOB: May 06, 1941 DOA: 07/03/2013 PCP: Lolita Patella, MD  Assessment/Plan: Principal Problem:   Fall Active Problems:   HTN (hypertension)   Alcohol abuse   Hyponatremia   Anemia of chronic disease   BPH (benign prostatic hyperplasia)   GERD (gastroesophageal reflux disease)   Alcohol intoxication    1. Fall: Patient presented following a fall. Fortunately, he sustained no bony injuries. Etiology is multi-factorial, secondary to acute ETOH intoxication, dehydration/volume depletion and profound hyponatremia. CT head and cervical spine showed no acute findings. Managing as outlined below. Evaluated by PT/OT and no home needs identified.  2. Chronic ETOH AbuseAcute alcohol intoxication: Patient admits to drinking about 5 beers a day. Alcohol level was 199 on admission, consistent with acute alcoholic intoxication. Managing with iv fluids, vitamin supplements and CIWA protocol. No clinical features of withdrawal, so far.  3. Hyponatremia: At presentation, patient was noted to have a profound hyponatremia of 119. Likely culprits are ETOH abuse, on the basis of Potomania. Dehydration, however, has to be considered. Managing with iv NS, and today, sodium is improving gradually. Serum osmolality is 262, and urine osmolality is 426. Following lytes. 4. HTN (hypertension): BP appears reasonably controlled at this time. Continued on pre-admission antihypertensives.  5. Anemia of chronic disease: HB is reasonable/Stable. HB 12.0 on admission.  6. Left apical lung nodule: Imaging studies revealed 8 x 15 mm left apical lung nodule, larger in size compared to prior studies. Per patient and family, this has been documented over the past 10 years or so. Be that as it may, due to interval change in size, follow up imaging by PMD appears indicated, and is so recommended.  7. BPH (benign prostatic hyperplasia): Stable/Asymptomatic. On Flomax   8. GERD (gastroesophageal reflux disease): Not problematic on PPI.    Code Status: Full Code.  Family Communication:  Disposition Plan: To be determined. Possible discharge on 07/06/13 or 07/07/13.    Brief narrative: 72 year old male with past medical history of OA, s/p left shoulder arthroplasty, right ankle arthrodesis, alcohol abuse, hypertension, GERD, presenting to [facility] ED status post fall at home. Patient reports drinking beer, prior to the fall. Patient reported gait imbalance and subsequent fall but no prodromal symptoms of shortness of breath, chest pain or palpitations. No lightheadedness or loss of consciousness. No abdominal pain or nausea or vomiting. No fever or chills or cough. No reports of blood in stool or urine. No blurry or double vision. In ED, BP was 114/59, HR 64, T 97.8 F and O2 saturation 100% on room air. CBC revealed hemoglobin of 12. BMP revealed sodium of 119. CT head and cervical spine did not reveal acute intracranial findings. There is a mention of left apical lung nodule larger in size, 8 x 15 mm which needs follow up in future. Admitted for further management.    Consultants:  N/A.   Procedures:  Head CT scan.   Cervical Spine CT.   Antibiotics:  N/A.   HPI/Subjective: Feels better. Had a restless night.  Objective: Vital signs in last 24 hours: Temp:  [97.8 F (36.6 C)-98.7 F (37.1 C)] 97.8 F (36.6 C) (08/01 1427) Pulse Rate:  [59-66] 66 (08/01 1427) Resp:  [16-18] 18 (08/01 1427) BP: (106-137)/(54-62) 114/59 mmHg (08/01 1427) SpO2:  [96 %-100 %] 100 % (08/01 1427) Weight:  [70.9 kg (156 lb 4.9 oz)] 70.9 kg (156 lb 4.9 oz) (08/01 0456) Weight change: -1.1 kg (-2 lb 6.8 oz) Last BM  Date: 07/04/13  Intake/Output from previous day: 07/31 0701 - 08/01 0700 In: 2830 [P.O.:920; I.V.:1910] Out: 1675 [Urine:1675] Total I/O In: 1326.3 [P.O.:720; I.V.:606.3] Out: 1200 [Urine:1200]   Physical Exam: General: Comfortable, alert, communicative,  fully oriented, not short of breath at rest.  HEENT:  No clinical pallor, no jaundice, no conjunctival injection or discharge. Hydration is satisfactory.  NECK:  Supple, JVP not seen, no carotid bruits, no palpable lymphadenopathy, no palpable goiter. CHEST:  Clinically clear to auscultation, no wheezes, no crackles. HEART:  Sounds 1 and 2 heard, normal, regular, no murmurs. ABDOMEN:  Full, soft, non-tender, no palpable organomegaly, no palpable masses, normal bowel sounds. GENITALIA:  Not examined. LOWER EXTREMITIES:  No pitting edema, palpable peripheral pulses. MUSCULOSKELETAL SYSTEM:  Generalized osteoarthritic changes, otherwise, normal. CENTRAL NERVOUS SYSTEM:  No focal neurologic deficit on gross examination.  Lab Results:  Recent Labs  07/04/13 0220 07/05/13 0441  WBC 4.4 4.3  HGB 12.0* 13.1  HCT 31.8* 35.8*  PLT 178 209    Recent Labs  07/04/13 0220 07/05/13 0441  NA 126* 128*  K 3.7 4.2  CL 92* 93*  CO2 22 28  GLUCOSE 83 105*  BUN 5* 8  CREATININE 0.63 0.59  CALCIUM 8.1* 9.0   No results found for this or any previous visit (from the past 240 hour(s)).   Studies/Results: Ct Head Wo Contrast  07/03/2013   *RADIOLOGY REPORT*  Clinical Data:  FALL HEAD INJURY,neck pain, fall.  CT HEAD WITHOUT CONTRAST CT CERVICAL SPINE WITHOUT CONTRAST  Technique:  Multidetector CT imaging of the head and cervical spine was performed following the standard protocol without IV contrast. Multiplanar CT image reconstructions of the cervical spine were also generated.  Comparison: 10/15/2011 and earlier studies  CT HEAD  Findings: Atherosclerotic and physiologic intracranial calcifications.  Mild atrophy. There is no evidence of acute intracranial hemorrhage, brain edema, mass lesion, acute infarction,   mass effect, or midline shift. Acute infarct may be inapparent on noncontrast CT.  No other intra-axial abnormalities are seen, and the ventricles and sulci are within normal limits in size  and symmetry.   No abnormal extra-axial fluid collections or masses are identified.  No significant calvarial abnormality.  IMPRESSION: 1. Negative for bleed or other acute intracranial process.  CT CERVICAL SPINE  Findings: Straightening of the normal cervical lordosis.  Negative for fracture.  Mild reversal of the normal cervical lordosis. Marked narrowing of all interspaces from C3-T2, with anterior and posterior endplate osteophytes.  Facet and uncovertebral spurring results in foraminal stenosis left C2-3, bilaterally C3-4, right C4- 5, right C5-6, bilaterally C6-7 and C7-T1.  Bilateral carotid calcified plaque.  Left apical lung nodule has enlarged, now 8 x 15 mm.  IMPRESSION:  1.  Negative for fracture or other acute bony abnormality. 2.  Advanced cervical degenerative changes as enumerated above. 3.  Enlargement of left apical pulmonary nodule.  Follow up recommended to exclude developing neoplasm.   Original Report Authenticated By: D. Andria Rhein, MD   Ct Cervical Spine Wo Contrast  07/03/2013   *RADIOLOGY REPORT*  Clinical Data:  FALL HEAD INJURY,neck pain, fall.  CT HEAD WITHOUT CONTRAST CT CERVICAL SPINE WITHOUT CONTRAST  Technique:  Multidetector CT imaging of the head and cervical spine was performed following the standard protocol without IV contrast. Multiplanar CT image reconstructions of the cervical spine were also generated.  Comparison: 10/15/2011 and earlier studies  CT HEAD  Findings: Atherosclerotic and physiologic intracranial calcifications.  Mild atrophy. There is no evidence  of acute intracranial hemorrhage, brain edema, mass lesion, acute infarction,   mass effect, or midline shift. Acute infarct may be inapparent on noncontrast CT.  No other intra-axial abnormalities are seen, and the ventricles and sulci are within normal limits in size and symmetry.   No abnormal extra-axial fluid collections or masses are identified.  No significant calvarial abnormality.  IMPRESSION: 1. Negative  for bleed or other acute intracranial process.  CT CERVICAL SPINE  Findings: Straightening of the normal cervical lordosis.  Negative for fracture.  Mild reversal of the normal cervical lordosis. Marked narrowing of all interspaces from C3-T2, with anterior and posterior endplate osteophytes.  Facet and uncovertebral spurring results in foraminal stenosis left C2-3, bilaterally C3-4, right C4- 5, right C5-6, bilaterally C6-7 and C7-T1.  Bilateral carotid calcified plaque.  Left apical lung nodule has enlarged, now 8 x 15 mm.  IMPRESSION:  1.  Negative for fracture or other acute bony abnormality. 2.  Advanced cervical degenerative changes as enumerated above. 3.  Enlargement of left apical pulmonary nodule.  Follow up recommended to exclude developing neoplasm.   Original Report Authenticated By: D. Andria Rhein, MD    Medications: Scheduled Meds: . amLODipine  10 mg Oral Daily  . atenolol  100 mg Oral Daily  . B-complex with vitamin C  1 tablet Oral Daily  . calcium carbonate  1,250 mg Oral Q breakfast  . cholecalciferol  1,000 Units Oral Daily  . feeding supplement  237 mL Oral Q24H  . folic acid  1 mg Oral Daily  . irbesartan  300 mg Oral Daily  . multivitamin with minerals  1 tablet Oral Daily  . pantoprazole  40 mg Oral Daily  . sodium chloride  3 mL Intravenous Q12H  . tamsulosin  0.4 mg Oral Daily  . thiamine  100 mg Oral Daily   Or  . thiamine  100 mg Intravenous Daily   Continuous Infusions: . sodium chloride 75 mL/hr at 07/05/13 1616   PRN Meds:.acetaminophen, acetaminophen, HYDROcodone-acetaminophen, LORazepam, LORazepam, morphine injection, ondansetron (ZOFRAN) IV, ondansetron, zolpidem    LOS: 2 days   Trinity Haun,CHRISTOPHER  [facility] Pager [phone]. If 8PM-8AM, please contact night-coverage at [email or web address], password [facility] 07/05/2013, 4:42 PM  LOS: 2 days

## 2013-07-06 LAB — BASIC METABOLIC PANEL
BUN: 5 mg/dL — ABNORMAL LOW (ref 6–23)
CO2: 26 mEq/L (ref 19–32)
GFR calc non Af Amer: >90 mL/min (ref >90)
Glucose, Bld: 114 mg/dL — ABNORMAL HIGH (ref 70–99)
Potassium: 3.4 mEq/L — ABNORMAL LOW (ref 3.5–5.1)
Sodium: 120 mEq/L — ABNORMAL LOW (ref 135–145)

## 2013-07-06 MED ORDER — POTASSIUM CHLORIDE CRYS ER 20 MEQ PO TBCR
40.0000 meq | EXTENDED_RELEASE_TABLET | Freq: Once | ORAL | Status: AC
  Administered 2013-07-06: 40 meq via ORAL
  Filled 2013-07-06: qty 2

## 2013-07-06 NOTE — Progress Notes (Signed)
[facility] PROGRESS NOTE  Anthony Curry GNF:[id number] DOB: [birth year] DOA: 07/03/2013 PCP: Lolita Patella, MD  Assessment/Plan: Principal Problem:   Fall Active Problems:   HTN (hypertension)   Alcohol abuse   Hyponatremia   Anemia of chronic disease   BPH (benign prostatic hyperplasia)   GERD (gastroesophageal reflux disease)   Alcohol intoxication    1. Fall: Patient presented following a fall. Fortunately, he sustained no bony injuries. Etiology is multi-factorial, secondary to acute ETOH intoxication, dehydration/volume depletion and profound hyponatremia. CT head and cervical spine showed no acute findings. Managing as outlined below. Evaluated by PT/OT and no home needs identified.  2. Chronic ETOH AbuseAcute alcohol intoxication: Patient admits to drinking about 5 beers a day. Alcohol level was 199 on admission, consistent with acute alcoholic intoxication. Managing with iv fluids, vitamin supplements and CIWA protocol. No clinical features of withdrawal, so far.  3. Hyponatremia: At presentation, patient was noted to have a profound hyponatremia of 119. Likely culprits are ETOH abuse, on the basis of Potomania. Dehydration, however, has to be considered. Managing with iv NS, and as of 07/05/13, sodium was 128.Today however, it has dropped to 120. Serum osmolality is 262, and urine osmolality is 426. Have started fluid restriction today. Following lytes. 4. HTN (hypertension): BP appears reasonably controlled at this time. Continued on pre-admission antihypertensives.  5. Anemia of chronic disease: HB is reasonable/Stable. HB 12.0 on admission.  6. Left apical lung nodule: Imaging studies revealed 8 x 15 mm left apical lung nodule, larger in size compared to prior studies. Per patient and family, this has been documented over the past 10 years or so. Be that as it may, due to interval change in size, follow up imaging by PMD appears indicated, and is so recommended.  7.  BPH (benign prostatic hyperplasia): Stable/Asymptomatic. On Flomax  8. GERD (gastroesophageal reflux disease): Not problematic on PPI.    Code Status: Full Code.  Family Communication:  Disposition Plan: To be determined. Possible discharge 07/07/13 or 07/08/13.    Brief narrative: 72 year old male with past medical history of OA, s/p left shoulder arthroplasty, right ankle arthrodesis, alcohol abuse, hypertension, GERD, presenting to [facility] ED status post fall at home. Patient reports drinking beer, prior to the fall. Patient reported gait imbalance and subsequent fall but no prodromal symptoms of shortness of breath, chest pain or palpitations. No lightheadedness or loss of consciousness. No abdominal pain or nausea or vomiting. No fever or chills or cough. No reports of blood in stool or urine. No blurry or double vision. In ED, BP was 114/59, HR 64, T 97.8 F and O2 saturation 100% on room air. CBC revealed hemoglobin of 12. BMP revealed sodium of 119. CT head and cervical spine did not reveal acute intracranial findings. There is a mention of left apical lung nodule larger in size, 8 x 15 mm which needs follow up in future. Admitted for further management.    Consultants:  N/A.   Procedures:  Head CT scan.   Cervical Spine CT.   Antibiotics:  N/A.   HPI/Subjective: No new issues.   Objective: Vital signs in last 24 hours: Temp:  [97.8 F (36.6 C)-98.5 F (36.9 C)] 98.1 F (36.7 C) (08/02 0525) Pulse Rate:  [60-66] 65 (08/02 1002) Resp:  [16-18] 16 (08/02 0525) BP: (113-135)/(51-67) 135/67 mmHg (08/02 1002) SpO2:  [99 %-100 %] 99 % (08/02 0525) Weight:  [70.9 kg (156 lb 4.9 oz)] 70.9 kg (156 lb 4.9 oz) (08/02 0525) Weight  change: 0 kg (0 lb) Last BM Date: 07/05/13  Intake/Output from previous day: 08/01 0701 - 08/02 0700 In: 2696.3 [P.O.:1040; I.V.:1656.3] Out: 2000 [Urine:2000] Total I/O In: 498.8 [I.V.:378.8; Other:120] Out: 500 [Urine:500]   Physical Exam: General:  Comfortable, alert, communicative, fully oriented, not short of breath at rest.  HEENT:  No clinical pallor, no jaundice, no conjunctival injection or discharge. Hydration is satisfactory.  NECK:  Supple, JVP not seen, no carotid bruits, no palpable lymphadenopathy, no palpable goiter. CHEST:  Clinically clear to auscultation, no wheezes, no crackles. HEART:  Sounds 1 and 2 heard, normal, regular, no murmurs. ABDOMEN:  Full, soft, non-tender, no palpable organomegaly, no palpable masses, normal bowel sounds. GENITALIA:  Not examined. LOWER EXTREMITIES:  No pitting edema, palpable peripheral pulses. MUSCULOSKELETAL SYSTEM:  Generalized osteoarthritic changes, otherwise, normal. CENTRAL NERVOUS SYSTEM:  No focal neurologic deficit on gross examination.  Lab Results:  Recent Labs  07/04/13 0220 07/05/13 0441  WBC 4.4 4.3  HGB 12.0* 13.1  HCT 31.8* 35.8*  PLT 178 209    Recent Labs  07/05/13 0441 07/06/13 0756  NA 128* 120*  K 4.2 3.4*  CL 93* 87*  CO2 28 26  GLUCOSE 105* 114*  BUN 8 5*  CREATININE 0.59 0.55  CALCIUM 9.0 8.4   No results found for this or any previous visit (from the past 240 hour(s)).   Studies/Results: No results found.  Medications: Scheduled Meds: . amLODipine  10 mg Oral Daily  . atenolol  100 mg Oral Daily  . B-complex with vitamin C  1 tablet Oral Daily  . calcium carbonate  1,250 mg Oral Q breakfast  . cholecalciferol  1,000 Units Oral Daily  . feeding supplement  237 mL Oral Q24H  . folic acid  1 mg Oral Daily  . irbesartan  300 mg Oral Daily  . multivitamin with minerals  1 tablet Oral Daily  . pantoprazole  40 mg Oral Daily  . potassium chloride  40 mEq Oral Once  . sodium chloride  3 mL Intravenous Q12H  . tamsulosin  0.4 mg Oral Daily  . thiamine  100 mg Oral Daily   Or  . thiamine  100 mg Intravenous Daily   Continuous Infusions:   PRN Meds:.acetaminophen, acetaminophen, HYDROcodone-acetaminophen, LORazepam, LORazepam, morphine  injection, ondansetron (ZOFRAN) IV, ondansetron, zolpidem    LOS: 3 days   Marko Skalski,CHRISTOPHER  [facility] Pager [phone]. If 8PM-8AM, please contact night-coverage at [email or web address], password [facility] 07/06/2013, 12:41 PM  LOS: 3 days

## 2013-07-06 NOTE — Progress Notes (Signed)
Physical Therapy Treatment Patient Details Name: Anthony Curry MRN: [id number] DOB: January 14, 1941 Today's Date: 07/06/2013 Time: 1202-1212 PT Time Calculation (min): 10 min  PT Assessment / Plan / Recommendation  History of Present Illness Pt presents following fall at home and history of ETOH abuse.  Resides at home with wife, who can provide mostly 24/7 care.    PT Comments   Pt improving in balance and is up ad lib in room. Pt has a cane at home.  Follow Up Recommendations  No PT follow up     Does the patient have the potential to tolerate intense rehabilitation     Barriers to Discharge        Equipment Recommendations  None recommended by PT    Recommendations for Other Services    Frequency Min 3X/week   Progress towards PT Goals Progress towards PT goals: Progressing toward goals  Plan Current plan remains appropriate    Precautions / Restrictions Precautions Precautions: Fall   Pertinent Vitals/Pain     Mobility  Bed Mobility Supine to Sit: 7: Independent Transfers Sit to Stand: 6: Modified independent (Device/Increase time) Stand to Sit: 6: Modified independent (Device/Increase time) Ambulation/Gait Ambulation/Gait Assistance: 5: Supervision Ambulation Distance (Feet): 300 Feet Assistive device: Straight cane Ambulation/Gait Assistance Details: pt intermittently uses cane. gait is steady. Noted pt up adlib in room, to Bathroom. Gait Pattern: Step-through pattern;Decreased stride length    Exercises     PT Diagnosis:    PT Problem List:   PT Treatment Interventions:     PT Goals (current goals can now be found in the care plan section)    Visit Information  Last PT Received On: 07/06/13 Assistance Needed: +1 History of Present Illness: Pt presents following fall at home and history of ETOH abuse.  Resides at home with wife, who can provide mostly 24/7 care.     Subjective Data      Cognition  Cognition Behavior During Therapy: WFL for tasks  assessed/performed Overall Cognitive Status: Within Functional Limits for tasks assessed    Balance     End of Session PT - End of Session Activity Tolerance: Patient tolerated treatment well Patient left: in chair;with call bell/phone within reach;with family/visitor present Nurse Communication: Mobility status   GP     Rada Hay 07/06/2013, 12:38 PM Blanchard Kelch PT [phone]

## 2013-07-07 LAB — SODIUM
Sodium: 124 mEq/L — ABNORMAL LOW (ref 135–145)
Sodium: 127 mEq/L — ABNORMAL LOW (ref 135–145)

## 2013-07-07 LAB — BASIC METABOLIC PANEL
BUN: 6 mg/dL (ref 6–23)
CO2: 26 mEq/L (ref 19–32)
GFR calc non Af Amer: >90 mL/min (ref >90)
Glucose, Bld: 106 mg/dL — ABNORMAL HIGH (ref 70–99)
Potassium: 4.2 mEq/L (ref 3.5–5.1)
Sodium: 121 mEq/L — ABNORMAL LOW (ref 135–145)

## 2013-07-07 MED ORDER — TOLVAPTAN 15 MG PO TABS
15.0000 mg | ORAL_TABLET | ORAL | Status: DC
  Administered 2013-07-07 – 2013-07-08 (×2): 15 mg via ORAL
  Filled 2013-07-07 (×4): qty 1

## 2013-07-07 NOTE — Consult Note (Addendum)
Anthony Curry 07/07/2013 Anthony Curry Requesting Physician:  Dr Brien Few  Reason for Consult:  Hyponatremia HPI: The patient is a 72 y.o. year-old with hx of etoh abuse admitted with low Na.  Rec'Curry IV NS for 48 hrs with some increase in Na then declined again so it was stopped. Uosm is 450 range. No complaints, has 10 year hx of this same problem.  Denies taking medication like demeclocycline or salt tablets for this condition.  He drinks beer daily about 4-5 cans for a long time.      ROS  no sob cough  no hx heart disease, +stress test in past was normal    Past Medical History  Past Medical History  Diagnosis Date  . Hypertension   . GERD (gastroesophageal reflux disease)   . Alcohol abuse   . 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 Hemorrhoid surgery    . Ankle arthrodesis  1995    rt fx-hardware in  . Tonsillectomy    . Colonoscopy    . Excision / curettage bone cyst finger  2005    rt thumb  . Total shoulder arthroplasty  02/03/2012    Procedure: TOTAL SHOULDER ARTHROPLASTY;  Surgeon: Eulas Post, MD;  Location: [facility];  Service: Orthopedics;  Laterality: Left;   Family History History reviewed. No pertinent family history. Social History  reports that he quit smoking about 29 years ago. He does not have any smokeless tobacco history on file. He reports that  drinks alcohol. He reports that he does not use illicit drugs. Allergies No Known Allergies Home medications Prior to Admission medications   Medication Sig Start Date End Date Taking? Authorizing Provider  amLODipine (NORVASC) 10 MG tablet Take 10 mg by mouth daily.     Yes Historical Provider, MD  atenolol (TENORMIN) 100 MG tablet Take 100 mg by mouth daily.     Yes Historical Provider, MD  B Complex-C (B-COMPLEX WITH VITAMIN C) tablet Take 1 tablet by mouth daily.   Yes Historical Provider, MD  calcium carbonate  (OS-CAL) 600 MG TABS Take 600 mg by mouth daily with breakfast.   Yes Historical Provider, MD  cholecalciferol (VITAMIN Curry) 1000 UNITS tablet Take 1,000 Units by mouth daily.   Yes Historical Provider, MD  Multiple Vitamin (MULTIVITAMIN WITH MINERALS) TABS Take 1 tablet by mouth daily.   Yes Historical Provider, MD  olmesartan (BENICAR) 40 MG tablet Take 40 mg by mouth daily.     Yes Historical Provider, MD  omeprazole (PRILOSEC) 20 MG capsule Take 20 mg by mouth daily.    Yes Historical Provider, MD  Potassium 99 MG TABS Take 1 tablet by mouth daily.   Yes Historical Provider, MD  Tamsulosin HCl (FLOMAX) 0.4 MG CAPS Take 1 capsule (0.4 mg total) by mouth daily. 08/19/12  Yes Carleene Cooper III, MD   Liver Function Tests  Recent Labs Lab 07/04/13 0220 07/05/13 0441  AST 30 26  ALT 19 16  ALKPHOS 63 73  BILITOT 0.5 1.0  PROT 5.2* 5.6*  ALBUMIN 2.9* 3.2*   No results found for this basename: LIPASE, AMYLASE,  in the last 168 hours CBC  Recent Labs Lab 07/03/13 2035 07/04/13 0220 07/05/13 0441  WBC 4.4 4.4 4.3  NEUTROABS 2.0 1.9  --   HGB 12.0* 12.0* 13.1  HCT 32.2* 31.8* 35.8*  MCV 94.4 94.4 95.5  PLT 190 178 209  Basic Metabolic Panel  Recent Labs Lab 07/03/13 2035 07/04/13 0220 07/05/13 0441 07/06/13 0756 07/07/13 0530  NA 119* 126* 128* 120* 121*  K 3.7 3.7 4.2 3.4* 4.2  CL 85* 92* 93* 87* 88*  CO2 21 22 28 26 26   GLUCOSE 117* 83 105* 114* 106*  BUN 6 5* 8 5* 6  CREATININE 0.65 0.63 0.59 0.55 0.63  CALCIUM 8.5 8.1* 9.0 8.4 9.2  PHOS  --  3.0  --   --   --     Physical Exam  Blood pressure 107/50, pulse 56, temperature 98 F (36.7 C), temperature source Oral, resp. rate 18, height 6\' 1"  (1.854 m), weight 67.6 kg (149 lb 0.5 oz), SpO2 100.00%. Gen: alert, no distress, calm Skin: no rash, cyanosis HEENT:  EOMI, sclera anicteric, throat clear Neck: no JVD, no bruits Chest: clear bilat CV: regular no murmur , rub or gallop, pedal pulses Abdomen: soft,  nontender, no ascites or HSM Ext: no LE or UE edema, no joint effusion, no gangrene/ulcers Neuro: alert, Ox3, nonfocal   Impression 1.  Hyponatremia- in a beer drinker "tea and toast" syndrome should be considered but the urine osm is too high and more c/w SIADH. He is not on any medications which would cause hyponatremia (ARB should be OK). Check echo and cortisol, but doubt adrenal or cardiac failure.  Did not improve with NS, will give tolvaptan tonight.  He should NOT be on any fluid restriction while getting tolvaptan. Exam is basically normal and this is probably very chronic and pt seems to be tolerating it without many symptoms.  Will follow  Plan- Tolvaptan 15mg  tonight, check Na q 4hrs, urine Na  Vinson Moselle  MD Pager [phone]    Cell  [phone] 07/07/2013, 5:55 PM

## 2013-07-07 NOTE — Progress Notes (Signed)
[facility] PROGRESS NOTE  Anthony Curry ZOX:[id number] DOB: 01/23/1941 DOA: 07/03/2013 PCP: Lolita Patella, MD  Assessment/Plan: Principal Problem:   Fall Active Problems:   HTN (hypertension)   Alcohol abuse   Hyponatremia   Anemia of chronic disease   BPH (benign prostatic hyperplasia)   GERD (gastroesophageal reflux disease)   Alcohol intoxication    1. Fall: Patient presented following a fall. Fortunately, he sustained no bony injuries. Etiology is multi-factorial, secondary to acute ETOH intoxication, dehydration/volume depletion and profound hyponatremia. CT head and cervical spine showed no acute findings. Managing as outlined below. Evaluated by PT/OT and no home needs identified.  2. Chronic ETOH AbuseAcute alcohol intoxication: Patient admits to drinking about 5 beers a day. Alcohol level was 199 on admission, consistent with acute alcoholic intoxication. Managed with iv fluids, vitamin supplements and CIWA protocol. No clinical features of withdrawal, so far.  3. Hyponatremia: At presentation, patient was noted to have a profound hyponatremia of 119. Likely culprits are ETOH abuse, on the basis of Potomania. Dehydration, however, has to be considered. Managed initially with iv NS, and as of 07/05/13, sodium was 128.On 07/06/13, sodium dropped to 120. Serum osmolality is 262, and urine osmolality is 426. 1200 ml/day fluid restriction was commenced on that date. Following lytes, and no appreciable chjange. Have consulted Dr Delano Metz, nephrologist.  4. HTN (hypertension): BP appears reasonably controlled at this time. Continued on pre-admission antihypertensives.  5. Anemia of chronic disease: HB is reasonable/Stable. HB 12.0 on admission.  6. Left apical lung nodule: Imaging studies revealed 8 x 15 mm left apical lung nodule, larger in size compared to prior studies. Per patient and family, this has been documented over the past 10 years or so. Be that as it may, due  to interval change in size, follow up imaging by PMD appears indicated, and is so recommended.  7. BPH (benign prostatic hyperplasia): Stable/Asymptomatic. On Flomax  8. GERD (gastroesophageal reflux disease): Not problematic on PPI.    Code Status: Full Code.  Family Communication:  Disposition Plan: To be determined. Possible discharge 07/07/13 or 07/08/13.    Brief narrative: 72 year old male with past medical history of OA, s/p left shoulder arthroplasty, right ankle arthrodesis, alcohol abuse, hypertension, GERD, presenting to [facility] ED status post fall at home. Patient reports drinking beer, prior to the fall. Patient reported gait imbalance and subsequent fall but no prodromal symptoms of shortness of breath, chest pain or palpitations. No lightheadedness or loss of consciousness. No abdominal pain or nausea or vomiting. No fever or chills or cough. No reports of blood in stool or urine. No blurry or double vision. In ED, BP was 114/59, HR 64, T 97.8 F and O2 saturation 100% on room air. CBC revealed hemoglobin of 12. BMP revealed sodium of 119. CT head and cervical spine did not reveal acute intracranial findings. There is a mention of left apical lung nodule larger in size, 8 x 15 mm which needs follow up in future. Admitted for further management.    Consultants:  N/A.   Procedures:  Head CT scan.   Cervical Spine CT.   Antibiotics:  N/A.   HPI/Subjective: Asymptomatic.   Objective: Vital signs in last 24 hours: Temp:  [98.1 F (36.7 C)-98.4 F (36.9 C)] 98.1 F (36.7 C) (08/03 0640) Pulse Rate:  [58-62] 58 (08/03 0640) Resp:  [16] 16 (08/03 0640) BP: (128-129)/(66-67) 129/67 mmHg (08/03 0640) SpO2:  [98 %-100 %] 100 % (08/03 0640) Weight:  [67.6 kg (  149 lb 0.5 oz)] 67.6 kg (149 lb 0.5 oz) (08/03 0640) Weight change: -3.3 kg (-7 lb 4.4 oz) Last BM Date: 07/06/13  Intake/Output from previous day: 08/02 0701 - 08/03 0700 In: 1295.8 [P.O.:560; I.V.:378.8] Out: 700  [Urine:700] Total I/O In: 620 [P.O.:320; Other:300] Out: 300 [Urine:300]   Physical Exam: General: Comfortable, alert, communicative, fully oriented, not short of breath at rest.  HEENT:  No clinical pallor, no jaundice, no conjunctival injection or discharge. Hydration is satisfactory.  NECK:  Supple, JVP not seen, no carotid bruits, no palpable lymphadenopathy, no palpable goiter. CHEST:  Clinically clear to auscultation, no wheezes, no crackles. HEART:  Sounds 1 and 2 heard, normal, regular, no murmurs. ABDOMEN:  Full, soft, non-tender, no palpable organomegaly, no palpable masses, normal bowel sounds. GENITALIA:  Not examined. LOWER EXTREMITIES:  No pitting edema, palpable peripheral pulses. MUSCULOSKELETAL SYSTEM:  Generalized osteoarthritic changes, otherwise, normal. CENTRAL NERVOUS SYSTEM:  No focal neurologic deficit on gross examination.  Lab Results:  Recent Labs  07/05/13 0441  WBC 4.3  HGB 13.1  HCT 35.8*  PLT 209    Recent Labs  07/06/13 0756 07/07/13 0530  NA 120* 121*  K 3.4* 4.2  CL 87* 88*  CO2 26 26  GLUCOSE 114* 106*  BUN 5* 6  CREATININE 0.55 0.63  CALCIUM 8.4 9.2   No results found for this or any previous visit (from the past 240 hour(s)).   Studies/Results: No results found.  Medications: Scheduled Meds: . amLODipine  10 mg Oral Daily  . atenolol  100 mg Oral Daily  . B-complex with vitamin C  1 tablet Oral Daily  . calcium carbonate  1,250 mg Oral Q breakfast  . cholecalciferol  1,000 Units Oral Daily  . feeding supplement  237 mL Oral Q24H  . folic acid  1 mg Oral Daily  . irbesartan  300 mg Oral Daily  . multivitamin with minerals  1 tablet Oral Daily  . pantoprazole  40 mg Oral Daily  . sodium chloride  3 mL Intravenous Q12H  . tamsulosin  0.4 mg Oral Daily  . thiamine  100 mg Oral Daily   Continuous Infusions:   PRN Meds:.acetaminophen, acetaminophen, HYDROcodone-acetaminophen, LORazepam, LORazepam, morphine injection,  ondansetron (ZOFRAN) IV, ondansetron, zolpidem    LOS: 4 days   Michaeljoseph Revolorio,CHRISTOPHER  [facility] Pager [phone]. If 8PM-8AM, please contact night-coverage at [email or web address], password [facility] 07/07/2013, 2:40 PM  LOS: 4 days

## 2013-07-08 LAB — CORTISOL: Cortisol, Plasma: 17.4 ug/dL

## 2013-07-08 LAB — GLUCOSE, CAPILLARY: Glucose-Capillary: 118 mg/dL — ABNORMAL HIGH (ref 70–99)

## 2013-07-08 LAB — SODIUM
Sodium: 128 mEq/L — ABNORMAL LOW (ref 135–145)
Sodium: 131 mEq/L — ABNORMAL LOW (ref 135–145)

## 2013-07-08 LAB — SODIUM, URINE, RANDOM: Sodium, Ur: 49 mEq/L

## 2013-07-08 MED ORDER — FOLIC ACID 1 MG PO TABS: 1.0000 mg | ORAL_TABLET | Freq: Every day | ORAL | Status: DC

## 2013-07-08 MED ORDER — ENSURE COMPLETE PO LIQD: 237.0000 mL | ORAL | Status: DC

## 2013-07-08 MED ORDER — DEMECLOCYCLINE HCL 150 MG PO TABS: 150.0000 mg | ORAL_TABLET | Freq: Two times a day (BID) | ORAL | Status: DC

## 2013-07-08 MED ORDER — DEMECLOCYCLINE HCL 150 MG PO TABS
150.0000 mg | ORAL_TABLET | Freq: Two times a day (BID) | ORAL | Status: DC
  Filled 2013-07-08: qty 1

## 2013-07-08 MED ORDER — THIAMINE HCL 100 MG PO TABS: 100.0000 mg | ORAL_TABLET | Freq: Every day | ORAL | Status: DC

## 2013-07-08 NOTE — Discharge Summary (Signed)
Physician Discharge Summary  Anthony Curry UJW:[id number] DOB: March 15, 1941 DOA: 07/03/2013  PCP: Lolita Patella, MD  Admit date: 07/03/2013 Discharge date: 07/08/2013  Time spent: 40 minutes  Recommendations for Outpatient Follow-up:  1. Follow up with primary MD.  2. Primary MD to arrange follow up chest CT scan for pulmonary nodule.   Discharge Diagnoses:  Principal Problem:   Fall Active Problems:   HTN (hypertension)   Alcohol abuse   Hyponatremia   Anemia of chronic disease   BPH (benign prostatic hyperplasia)   GERD (gastroesophageal reflux disease)   Alcohol intoxication   Discharge Condition: Satisfactory.  Diet recommendation: Regular.  Filed Weights   07/06/13 0525 07/07/13 0640 07/08/13 0529  Weight: 70.9 kg (156 lb 4.9 oz) 67.6 kg (149 lb 0.5 oz) 67.9 kg (149 lb 11.1 oz)    History of present illness:  72 year old male with past medical history of OA, s/p left shoulder arthroplasty, right ankle arthrodesis, alcohol abuse, hypertension, GERD, presenting to [facility] ED status post fall at home. Patient reports drinking beer, prior to the fall. Patient reported gait imbalance and subsequent fall but no prodromal symptoms of shortness of breath, chest pain or palpitations. No lightheadedness or loss of consciousness. No abdominal pain or nausea or vomiting. No fever or chills or cough. No reports of blood in stool or urine. No blurry or double vision. In ED, BP was 114/59, HR 64, T 97.8 F and O2 saturation 100% on room air. CBC revealed hemoglobin of 12. BMP revealed sodium of 119. CT head and cervical spine did not reveal acute intracranial findings. There is a mention of left apical lung nodule larger in size, 8 x 15 mm which needs follow up in future. Admitted for further management.    Hospital Course:  1. Fall: Patient presented following a fall. Fortunately, he sustained no bony injuries. Etiology is multi-factorial, secondary to acute ETOH intoxication,  dehydration/volume depletion and profound hyponatremia. CT head and cervical spine showed no acute findings. Managing as outlined below. Evaluated by PT/OT and no home needs identified.  2. Chronic ETOH AbuseAcute alcohol intoxication: Patient admits to drinking about 5 beers a day. Alcohol level was 199 on admission, consistent with acute alcoholic intoxication. Managed with iv fluids, vitamin supplements and CIWA protocol. No clinical features of withdrawal, were evident, during this hospitalization.  3. Hyponatremia: At presentation, patient was noted to have a profound hyponatremia of 119. Likely culprits are ETOH abuse, on the basis of Potomania, vs SIADH. Dehydration, however, had to be considered. Managed initially with iv NS, and as of 07/05/13, sodium was 128.On 07/06/13, sodium dropped to 120. Serum osmolality was 262, and urine osmolality was 426. 1200 ml/day fluid restriction was commenced on that date. Lytes were followed, no appreciable change noted. Dr Delano Metz provided nephrology consultation, and patient was subsequently seen by Dr Zetta Bills. He responded to a trial of Tolvaptan, with bump in serum sodium to 131. Per nephrology, patient is to be discharged on Demeclocycline, 150 mg BID. [name]  4. HTN (hypertension): BP appears reasonably controlled at this time. Continued on pre-admission antihypertensives.  5. Anemia of chronic disease: HB is reasonable/Stable. HB 12.0 on admission.  6. Left apical lung nodule: Imaging studies revealed 8 x 15 mm left apical lung nodule, larger in size compared to prior studies. Per patient and family, this has been documented over the past 10 years or so. Be that as it may, due to interval change in size, follow up imaging by PMD  appears indicated, and is so recommended.  7. BPH (benign prostatic hyperplasia): Stable/Asymptomatic. On Flomax  8. GERD (gastroesophageal reflux disease): Not problematic on PPI.    Procedures:  See Below.    Consultations:  Dr Delano Metz, nephrologist.   Discharge Exam: Filed Vitals:   07/07/13 0640 07/07/13 1451 07/07/13 2128 07/08/13 0529  BP: 129/67 107/50 102/50 103/51  Pulse: 58 56 60 69  Temp: 98.1 F (36.7 C) 98 F (36.7 C) 98.1 F (36.7 C) 97.6 F (36.4 C)  TempSrc: Oral Oral Oral Oral  Resp: 16 18 18 18   Height:      Weight: 67.6 kg (149 lb 0.5 oz)   67.9 kg (149 lb 11.1 oz)  SpO2: 100% 100% 100% 100%   General: Comfortable, alert, communicative, fully oriented, not short of breath at rest.  HEENT: No clinical pallor, no jaundice, no conjunctival injection or discharge. Hydration is satisfactory.  NECK: Supple, JVP not seen, no carotid bruits, no palpable lymphadenopathy, no palpable goiter.  CHEST: Clinically clear to auscultation, no wheezes, no crackles.  HEART: Sounds 1 and 2 heard, normal, regular, no murmurs.  ABDOMEN: Full, soft, non-tender, no palpable organomegaly, no palpable masses, normal bowel sounds.  GENITALIA: Not examined.  LOWER EXTREMITIES: No pitting edema, palpable peripheral pulses.  MUSCULOSKELETAL SYSTEM: Generalized osteoarthritic changes, otherwise, normal.  CENTRAL NERVOUS SYSTEM: No focal neurologic deficit on gross examination.  Discharge Instructions      Discharge Orders   Future Orders Complete By Expires     Diet general  As directed     Increase activity slowly  As directed         Medication List         amLODipine 10 MG tablet  Commonly known as:  NORVASC  Take 10 mg by mouth daily.     atenolol 100 MG tablet  Commonly known as:  TENORMIN  Take 100 mg by mouth daily.     B-complex with vitamin C tablet  Take 1 tablet by mouth daily.     calcium carbonate 600 MG Tabs tablet  Commonly known as:  OS-CAL  Take 600 mg by mouth daily with breakfast.     cholecalciferol 1000 UNITS tablet  Commonly known as:  VITAMIN D  Take 1,000 Units by mouth daily.     demeclocycline 150 MG tablet  Commonly known as:   DECLOMYCIN  Take 1 tablet (150 mg total) by mouth 2 (two) times daily before lunch and supper.     feeding supplement Liqd  Take 237 mLs by mouth daily.     folic acid 1 MG tablet  Commonly known as:  FOLVITE  Take 1 tablet (1 mg total) by mouth daily.     multivitamin with minerals Tabs tablet  Take 1 tablet by mouth daily.     olmesartan 40 MG tablet  Commonly known as:  BENICAR  Take 40 mg by mouth daily.     omeprazole 20 MG capsule  Commonly known as:  PRILOSEC  Take 20 mg by mouth daily.     Potassium 99 MG Tabs  Take 1 tablet by mouth daily.     tamsulosin 0.4 MG Caps capsule  Commonly known as:  FLOMAX  Take 1 capsule (0.4 mg total) by mouth daily.     thiamine 100 MG tablet  Take 1 tablet (100 mg total) by mouth daily.       No Known Allergies Follow-up Information   Call Lolita Patella, MD.   Contact  information:   [facility] [address] [place] [address] [phone]        The results of significant diagnostics from this hospitalization (including imaging, microbiology, ancillary and laboratory) are listed below for reference.    Significant Diagnostic Studies: Ct Head Wo Contrast  07/03/2013   *RADIOLOGY REPORT*  Clinical Data:  FALL HEAD INJURY,neck pain, fall.  CT HEAD WITHOUT CONTRAST CT CERVICAL SPINE WITHOUT CONTRAST  Technique:  Multidetector CT imaging of the head and cervical spine was performed following the standard protocol without IV contrast. Multiplanar CT image reconstructions of the cervical spine were also generated.  Comparison: 10/15/2011 and earlier studies  CT HEAD  Findings: Atherosclerotic and physiologic intracranial calcifications.  Mild atrophy. There is no evidence of acute intracranial hemorrhage, brain edema, mass lesion, acute infarction,   mass effect, or midline shift. Acute infarct may be inapparent on noncontrast CT.  No other intra-axial abnormalities are seen, and the  ventricles and sulci are within normal limits in size and symmetry.   No abnormal extra-axial fluid collections or masses are identified.  No significant calvarial abnormality.  IMPRESSION: 1. Negative for bleed or other acute intracranial process.  CT CERVICAL SPINE  Findings: Straightening of the normal cervical lordosis.  Negative for fracture.  Mild reversal of the normal cervical lordosis. Marked narrowing of all interspaces from C3-T2, with anterior and posterior endplate osteophytes.  Facet and uncovertebral spurring results in foraminal stenosis left C2-3, bilaterally C3-4, right C4- 5, right C5-6, bilaterally C6-7 and C7-T1.  Bilateral carotid calcified plaque.  Left apical lung nodule has enlarged, now 8 x 15 mm.  IMPRESSION:  1.  Negative for fracture or other acute bony abnormality. 2.  Advanced cervical degenerative changes as enumerated above. 3.  Enlargement of left apical pulmonary nodule.  Follow up recommended to exclude developing neoplasm.   Original Report Authenticated By: D. Andria Rhein, MD   Ct Cervical Spine Wo Contrast  07/03/2013   *RADIOLOGY REPORT*  Clinical Data:  FALL HEAD INJURY,neck pain, fall.  CT HEAD WITHOUT CONTRAST CT CERVICAL SPINE WITHOUT CONTRAST  Technique:  Multidetector CT imaging of the head and cervical spine was performed following the standard protocol without IV contrast. Multiplanar CT image reconstructions of the cervical spine were also generated.  Comparison: 10/15/2011 and earlier studies  CT HEAD  Findings: Atherosclerotic and physiologic intracranial calcifications.  Mild atrophy. There is no evidence of acute intracranial hemorrhage, brain edema, mass lesion, acute infarction,   mass effect, or midline shift. Acute infarct may be inapparent on noncontrast CT.  No other intra-axial abnormalities are seen, and the ventricles and sulci are within normal limits in size and symmetry.   No abnormal extra-axial fluid collections or masses are identified.  No  significant calvarial abnormality.  IMPRESSION: 1. Negative for bleed or other acute intracranial process.  CT CERVICAL SPINE  Findings: Straightening of the normal cervical lordosis.  Negative for fracture.  Mild reversal of the normal cervical lordosis. Marked narrowing of all interspaces from C3-T2, with anterior and posterior endplate osteophytes.  Facet and uncovertebral spurring results in foraminal stenosis left C2-3, bilaterally C3-4, right C4- 5, right C5-6, bilaterally C6-7 and C7-T1.  Bilateral carotid calcified plaque.  Left apical lung nodule has enlarged, now 8 x 15 mm.  IMPRESSION:  1.  Negative for fracture or other acute bony abnormality. 2.  Advanced cervical degenerative changes as enumerated above. 3.  Enlargement of left apical pulmonary nodule.  Follow up recommended to  exclude developing neoplasm.   Original Report Authenticated By: D. Andria Rhein, MD    Microbiology: No results found for this or any previous visit (from the past 240 hour(s)).   Labs: Basic Metabolic Panel:  Recent Labs Lab 07/03/13 2035 07/04/13 0220 07/05/13 0441 07/06/13 0756 07/07/13 0530 07/07/13 1850 07/07/13 2323 07/08/13 0218 07/08/13 0445 07/08/13 0938  NA 119* 126* 128* 120* 121* 124* 127* 128* 131* 129*  K 3.7 3.7 4.2 3.4* 4.2  --   --   --   --   --   CL 85* 92* 93* 87* 88*  --   --   --   --   --   CO2 21 22 28 26 26   --   --   --   --   --   GLUCOSE 117* 83 105* 114* 106*  --   --   --   --   --   BUN 6 5* 8 5* 6  --   --   --   --   --   CREATININE 0.65 0.63 0.59 0.55 0.63  --   --   --   --   --   CALCIUM 8.5 8.1* 9.0 8.4 9.2  --   --   --   --   --   MG  --  1.5  --   --   --   --   --   --   --   --   PHOS  --  3.0  --   --   --   --   --   --   --   --    Liver Function Tests:  Recent Labs Lab 07/04/13 0220 07/05/13 0441  AST 30 26  ALT 19 16  ALKPHOS 63 73  BILITOT 0.5 1.0  PROT 5.2* 5.6*  ALBUMIN 2.9* 3.2*   No results found for this basename: LIPASE, AMYLASE,   in the last 168 hours No results found for this basename: AMMONIA,  in the last 168 hours CBC:  Recent Labs Lab 07/03/13 2035 07/04/13 0220 07/05/13 0441  WBC 4.4 4.4 4.3  NEUTROABS 2.0 1.9  --   HGB 12.0* 12.0* 13.1  HCT 32.2* 31.8* 35.8*  MCV 94.4 94.4 95.5  PLT 190 178 209   Cardiac Enzymes: No results found for this basename: CKTOTAL, CKMB, CKMBINDEX, TROPONINI,  in the last 168 hours BNP: BNP (last 3 results) No results found for this basename: PROBNP,  in the last 8760 hours CBG:  Recent Labs Lab 07/05/13 0730 07/05/13 2344 07/06/13 0715 07/07/13 0700 07/08/13 0806  GLUCAP 104* 104* 112* 101* 118*       Signed:  Cyndel Griffey,CHRISTOPHER  [facility] 07/08/2013, 2:04 PM

## 2013-07-08 NOTE — Progress Notes (Signed)
Patient ID: Anthony Curry, male   DOB: 09-05-1941, 72 y.o.   MRN: [id number]   [facility] Progress Note   Assessment/ Plan:   1. 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 Now on tolvaptan and I will go ahead and also start him on demeclocycline as a long term agent (as tolvaptan can only be given for 30 days due to toxicity concerns and is prohibitively expensive). There is no data as to what the "safe sodium for discharge" is and a consensus has usually been for for gait/neuro problems. Would consider stopping ARB if hyponatremia recurrent. 2. Hypertension: BP well controlled, continue on current regime- may be able to decrease irbesartan.  3. Fall: possibly due to hyponatremia  Subjective:   Comfortable up in his recliner- inquires about going home   Objective:   BP 103/51  Pulse 69  Temp(Src) 97.6 F (36.4 C) (Oral)  Resp 18  Ht 6\' 1"  (1.854 m)  Wt 67.9 kg (149 lb 11.1 oz)  BMI 19.75 kg/m2  SpO2 100%  Physical Exam: WUJ:[id number] sitting up AOZ:HYQMV RRR, normal S1 and S2  Resp:CTA bilaterally, no rales/rhonchi HQI:ONGE, flat, NT, BS normal Ext:No LE edema.  Labs: BMET  Recent Labs Lab 07/03/13 2035 07/04/13 0220 07/05/13 0441 07/06/13 0756 07/07/13 0530 07/07/13 1850 07/07/13 2323 07/08/13 0218 07/08/13 0445 07/08/13 0938  NA 119* 126* 128* 120* 121* 124* 127* 128* 131* 129*  K 3.7 3.7 4.2 3.4* 4.2  --   --   --   --   --   CL 85* 92* 93* 87* 88*  --   --   --   --   --   CO2 21 22 28 26 26   --   --   --   --   --   GLUCOSE 117* 83 105* 114* 106*  --   --   --   --   --   BUN 6 5* 8 5* 6  --   --   --   --   --   CREATININE 0.65 0.63 0.59 0.55 0.63  --   --   --   --   --   CALCIUM 8.5 8.1* 9.0 8.4 9.2  --   --   --   --   --   PHOS  --  3.0  --   --   --   --   --   --   --   --    CBC  Recent Labs Lab 07/03/13 2035 07/04/13 0220  07/05/13 0441  WBC 4.4 4.4 4.3  NEUTROABS 2.0 1.9  --   HGB 12.0* 12.0* 13.1  HCT 32.2* 31.8* 35.8*  MCV 94.4 94.4 95.5  PLT 190 178 209   Medications:    . amLODipine  10 mg Oral Daily  . atenolol  100 mg Oral Daily  . B-complex with vitamin C  1 tablet Oral Daily  . calcium carbonate  1,250 mg Oral Q breakfast  . cholecalciferol  1,000 Units Oral Daily  . feeding supplement  237 mL Oral Q24H  . folic acid  1 mg Oral Daily  . irbesartan  300 mg Oral Daily  . multivitamin with minerals  1 tablet Oral Daily  . pantoprazole  40 mg Oral Daily  . sodium chloride  3 mL Intravenous Q12H  . tamsulosin  0.4 mg Oral Daily  .  thiamine  100 mg Oral Daily  . tolvaptan  15 mg Oral Q24H   Zetta Bills, MD 07/08/2013, 12:53 PM

## 2013-07-08 NOTE — Progress Notes (Signed)
   Patient does not want test. Refused.  Anthony Curry

## 2013-08-06 ENCOUNTER — Other Ambulatory Visit: Payer: Self-pay | Admitting: Family Medicine

## 2013-08-06 DIAGNOSIS — R9389 Abnormal findings on diagnostic imaging of other specified body structures: Secondary | ICD-10-CM

## 2013-08-06 DIAGNOSIS — R911 Solitary pulmonary nodule: Secondary | ICD-10-CM

## 2013-08-08 ENCOUNTER — Other Ambulatory Visit: Payer: Medicare Other

## 2013-08-12 ENCOUNTER — Ambulatory Visit
Admission: RE | Admit: 2013-08-12 | Discharge: 2013-08-12 | Disposition: A | Discharge status: Home / Self Care | Payer: Medicare Other | Source: Ambulatory Visit | Attending: Family Medicine | Admitting: Family Medicine

## 2013-08-12 DIAGNOSIS — R911 Solitary pulmonary nodule: Secondary | ICD-10-CM

## 2013-08-12 DIAGNOSIS — J984 Other disorders of lung: Secondary | ICD-10-CM | POA: Diagnosis not present

## 2013-08-12 DIAGNOSIS — R9389 Abnormal findings on diagnostic imaging of other specified body structures: Secondary | ICD-10-CM

## 2013-08-19 ENCOUNTER — Ambulatory Visit (INDEPENDENT_AMBULATORY_CARE_PROVIDER_SITE_OTHER): Payer: Medicare Other | Admitting: Pulmonary Disease

## 2013-08-19 ENCOUNTER — Encounter: Payer: Self-pay | Admitting: Pulmonary Disease

## 2013-08-19 VITALS — BP 130/80 | HR 55 | Temp 97.8°F | Ht 72.0 in | Wt 154.8 lb

## 2013-08-19 DIAGNOSIS — R918 Other nonspecific abnormal finding of lung field: Secondary | ICD-10-CM | POA: Diagnosis not present

## 2013-08-19 NOTE — Patient Instructions (Addendum)
Will order PET scan to further evaluate the abnormalities on your ct scan. Will call you with results.

## 2013-08-19 NOTE — Assessment & Plan Note (Signed)
The patient has an enlarging left upper lobe nodule that is somewhat concerning for possible bronchogenic cancer.  He also has bilateral groundglass opacities with some increase in size, and these may simply be inflammatory or represent indolent adenocarcinoma.  I would like to do a PET scan for further evaluation, and then discussed management with him.  This can include surveillance or possible biopsy.

## 2013-08-19 NOTE — Progress Notes (Signed)
  Subjective:    Patient ID: Anthony Curry, male    DOB: 06-Feb-1941, 72 y.o.   MRN: [id number]  HPI The patient is a 72 year old male who I've been asked to see for pulmonary nodules.  He has had a recent CT chest which showed an enlarging left upper lobe process, as well as multiple bilateral groundglass opacities.  The groundglass opacities have also increased in size as well, but no solid component is noted.  The patient has a long history of smoking, but quit at least 20+ years ago.  His weight has been stable, and has an adequate appetite.  He has no personal history of cancer, and tells me that his health maintenance is completely up-to-date.  The patient does live in [place] for a period of time, and denies any history of TB exposure.  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  He has minimal cough in the morning that he thinks is secondary to postnasal drip, and denies any hemoptysis.  He feels that his exertional tolerance is adequate.   Review of Systems  Constitutional: Negative for fever and unexpected weight change.  HENT: Negative for ear pain, nosebleeds, congestion, sore throat, rhinorrhea, sneezing, trouble swallowing, dental problem, postnasal drip and sinus pressure.   Eyes: Negative for redness and itching.  Respiratory: Negative for cough, chest tightness, shortness of breath and wheezing.   Cardiovascular: Negative for palpitations and leg swelling.  Gastrointestinal: Negative for nausea and vomiting.  Genitourinary: Negative for dysuria.  Musculoskeletal: Negative for joint swelling.  Skin: Negative for rash.  Neurological: Negative for headaches.  Hematological: Does not bruise/bleed easily.  Psychiatric/Behavioral: Negative for dysphoric mood. The patient is nervous/anxious.        Objective:   Physical Exam Constitutional:  thin, no acute distress  HENT:  Nares patent without discharge  Oropharynx without exudate, palate and uvula are normal  Eyes:   Perrla, eomi, no scleral icterus  Neck:  No JVD, no TMG  Cardiovascular:  Normal rate, regular rhythm, no rubs or gallops.  No murmurs        Intact distal pulses  Pulmonary :  Mildly decreased breath sounds, no stridor or respiratory distress   No rales, rhonchi, or wheezing  Abdominal:  Soft, nondistended, bowel sounds present.  No tenderness noted.   Musculoskeletal:  mild lower extremity edema noted.  Lymph Nodes:  No cervical lymphadenopathy noted  Skin:  No cyanosis noted  Neurologic:  Alert, appropriate, moves all 4 extremities without obvious deficit.         Assessment & Plan:

## 2013-08-27 ENCOUNTER — Encounter (HOSPITAL_COMMUNITY)
Admission: RE | Admit: 2013-08-27 | Discharge: 2013-08-27 | Disposition: A | Discharge status: Home / Self Care | Payer: Medicare Other | Source: Ambulatory Visit | Attending: Pulmonary Disease | Admitting: Pulmonary Disease

## 2013-08-27 DIAGNOSIS — K8020 Calculus of gallbladder without cholecystitis without obstruction: Secondary | ICD-10-CM | POA: Diagnosis not present

## 2013-08-27 DIAGNOSIS — E279 Disorder of adrenal gland, unspecified: Secondary | ICD-10-CM | POA: Diagnosis not present

## 2013-08-27 DIAGNOSIS — M954 Acquired deformity of chest and rib: Secondary | ICD-10-CM | POA: Diagnosis not present

## 2013-08-27 DIAGNOSIS — R918 Other nonspecific abnormal finding of lung field: Secondary | ICD-10-CM | POA: Insufficient documentation

## 2013-08-27 DIAGNOSIS — I2510 Atherosclerotic heart disease of native coronary artery without angina pectoris: Secondary | ICD-10-CM | POA: Diagnosis not present

## 2013-08-27 DIAGNOSIS — Z8611 Personal history of tuberculosis: Secondary | ICD-10-CM | POA: Insufficient documentation

## 2013-08-27 DIAGNOSIS — J984 Other disorders of lung: Secondary | ICD-10-CM | POA: Diagnosis not present

## 2013-08-27 DIAGNOSIS — I517 Cardiomegaly: Secondary | ICD-10-CM | POA: Diagnosis not present

## 2013-08-27 DIAGNOSIS — C3490 Malignant neoplasm of unspecified part of unspecified bronchus or lung: Secondary | ICD-10-CM | POA: Insufficient documentation

## 2013-08-27 LAB — GLUCOSE, CAPILLARY: Glucose-Capillary: 85 mg/dL (ref 70–99)

## 2013-08-27 MED ORDER — FLUDEOXYGLUCOSE F - 18 (FDG) INJECTION
18.8000 | Freq: Once | INTRAVENOUS | Status: AC | PRN
  Administered 2013-08-27: 18.8 via INTRAVENOUS

## 2013-08-28 ENCOUNTER — Telehealth: Payer: Self-pay | Admitting: Pulmonary Disease

## 2013-08-28 NOTE — Telephone Encounter (Signed)
See note in radiology

## 2013-08-29 ENCOUNTER — Other Ambulatory Visit: Payer: Self-pay | Admitting: Pulmonary Disease

## 2013-08-29 ENCOUNTER — Telehealth: Payer: Self-pay | Admitting: Pulmonary Disease

## 2013-08-29 DIAGNOSIS — R918 Other nonspecific abnormal finding of lung field: Secondary | ICD-10-CM

## 2013-08-29 NOTE — Telephone Encounter (Signed)
Discussed with pt.  Have recommended adrenal mass biopsy

## 2013-08-29 NOTE — Telephone Encounter (Signed)
I spoke with the pt and advised that we have sent the message to Dr. Shelle Iron to call him back to repeat the results due to the nature of them. The pt states understanding and will await call. Carron Curie, CMA

## 2013-08-29 NOTE — Telephone Encounter (Signed)
Notes Recorded by Barbaraann Share, MD on 08/28/2013 at 6:46 PM Long discussion with pt about results. Very complicated. 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 Should we biopsy adrenals, but if +, which spot in the lung is cancer?? Would like to discuss with my colleagues and come up with safest/most effic plan. Pt is agreeable to this. .      Will request [facility] call patient back as I am not comfortable speaking with patient about results and possible questions he may have. Thanks.

## 2013-08-29 NOTE — Telephone Encounter (Signed)
Pt is calling back again really wants results again he got nervous about what was discussed and has gotten mixed up and wants to know

## 2013-09-02 ENCOUNTER — Telehealth: Payer: Self-pay | Admitting: Pulmonary Disease

## 2013-09-02 ENCOUNTER — Telehealth: Payer: Self-pay | Admitting: Emergency Medicine

## 2013-09-02 NOTE — Telephone Encounter (Signed)
I called and made pt spouse aware of Almyra Free documented it is in review. I advised her will call once we know something. Nothing further needed

## 2013-09-02 NOTE — Telephone Encounter (Signed)
Biopsy is in review with IR Tobe Sos

## 2013-09-02 NOTE — Telephone Encounter (Signed)
Barbaraann Share, MD at 08/29/2013  1:25 PM    Status: Signed                   Discussed with pt.  Have recommended adrenal mass biopsy    ---  I see order in EPIC for CT biopsy. Please advise PCC's thanks

## 2013-09-05 ENCOUNTER — Encounter (HOSPITAL_COMMUNITY): Payer: Self-pay | Admitting: Pharmacy Technician

## 2013-09-05 ENCOUNTER — Other Ambulatory Visit: Payer: Self-pay | Admitting: Radiology

## 2013-09-09 ENCOUNTER — Ambulatory Visit (HOSPITAL_COMMUNITY)
Admission: RE | Admit: 2013-09-09 | Discharge: 2013-09-09 | Disposition: A | Discharge status: Home / Self Care | Payer: Medicare Other | Source: Ambulatory Visit | Attending: Radiology | Admitting: Radiology

## 2013-09-09 ENCOUNTER — Encounter (HOSPITAL_COMMUNITY): Payer: Self-pay

## 2013-09-09 ENCOUNTER — Inpatient Hospital Stay (HOSPITAL_COMMUNITY): Payer: Medicare Other

## 2013-09-09 ENCOUNTER — Other Ambulatory Visit (HOSPITAL_COMMUNITY): Payer: Self-pay | Admitting: Radiology

## 2013-09-09 ENCOUNTER — Inpatient Hospital Stay (HOSPITAL_COMMUNITY)
Admission: RE | Admit: 2013-09-09 | Discharge: 2013-09-11 | DRG: 920 | Disposition: A | Discharge status: Home / Self Care | Payer: Medicare Other | Source: Ambulatory Visit | Attending: Internal Medicine | Admitting: Internal Medicine

## 2013-09-09 VITALS — BP 126/71 | HR 69 | Temp 97.8°F | Resp 16 | Ht 74.0 in | Wt 154.1 lb

## 2013-09-09 DIAGNOSIS — C7970 Secondary malignant neoplasm of unspecified adrenal gland: Secondary | ICD-10-CM | POA: Diagnosis not present

## 2013-09-09 DIAGNOSIS — K219 Gastro-esophageal reflux disease without esophagitis: Secondary | ICD-10-CM | POA: Diagnosis present

## 2013-09-09 DIAGNOSIS — E2749 Other adrenocortical insufficiency: Secondary | ICD-10-CM | POA: Diagnosis present

## 2013-09-09 DIAGNOSIS — Z981 Arthrodesis status: Secondary | ICD-10-CM

## 2013-09-09 DIAGNOSIS — E278 Other specified disorders of adrenal gland: Secondary | ICD-10-CM

## 2013-09-09 DIAGNOSIS — M1990 Unspecified osteoarthritis, unspecified site: Secondary | ICD-10-CM | POA: Diagnosis present

## 2013-09-09 DIAGNOSIS — E279 Disorder of adrenal gland, unspecified: Secondary | ICD-10-CM | POA: Diagnosis not present

## 2013-09-09 DIAGNOSIS — R579 Shock, unspecified: Secondary | ICD-10-CM | POA: Diagnosis not present

## 2013-09-09 DIAGNOSIS — Z87891 Personal history of nicotine dependence: Secondary | ICD-10-CM

## 2013-09-09 DIAGNOSIS — I959 Hypotension, unspecified: Secondary | ICD-10-CM

## 2013-09-09 DIAGNOSIS — C3490 Malignant neoplasm of unspecified part of unspecified bronchus or lung: Secondary | ICD-10-CM | POA: Diagnosis present

## 2013-09-09 DIAGNOSIS — R918 Other nonspecific abnormal finding of lung field: Secondary | ICD-10-CM

## 2013-09-09 DIAGNOSIS — F411 Generalized anxiety disorder: Secondary | ICD-10-CM | POA: Diagnosis present

## 2013-09-09 DIAGNOSIS — Z96619 Presence of unspecified artificial shoulder joint: Secondary | ICD-10-CM

## 2013-09-09 DIAGNOSIS — Z9889 Other specified postprocedural states: Secondary | ICD-10-CM

## 2013-09-09 DIAGNOSIS — Z201 Contact with and (suspected) exposure to tuberculosis: Secondary | ICD-10-CM | POA: Diagnosis present

## 2013-09-09 DIAGNOSIS — E876 Hypokalemia: Secondary | ICD-10-CM | POA: Diagnosis not present

## 2013-09-09 DIAGNOSIS — D62 Acute posthemorrhagic anemia: Secondary | ICD-10-CM | POA: Diagnosis not present

## 2013-09-09 DIAGNOSIS — I10 Essential (primary) hypertension: Secondary | ICD-10-CM | POA: Diagnosis present

## 2013-09-09 DIAGNOSIS — IMO0002 Reserved for concepts with insufficient information to code with codable children: Principal | ICD-10-CM | POA: Diagnosis not present

## 2013-09-09 DIAGNOSIS — J449 Chronic obstructive pulmonary disease, unspecified: Secondary | ICD-10-CM | POA: Diagnosis not present

## 2013-09-09 DIAGNOSIS — Y849 Medical procedure, unspecified as the cause of abnormal reaction of the patient, or of later complication, without mention of misadventure at the time of the procedure: Secondary | ICD-10-CM | POA: Diagnosis present

## 2013-09-09 DIAGNOSIS — R58 Hemorrhage, not elsewhere classified: Secondary | ICD-10-CM | POA: Diagnosis not present

## 2013-09-09 DIAGNOSIS — F1010 Alcohol abuse, uncomplicated: Secondary | ICD-10-CM | POA: Diagnosis present

## 2013-09-09 DIAGNOSIS — E871 Hypo-osmolality and hyponatremia: Secondary | ICD-10-CM

## 2013-09-09 LAB — BASIC METABOLIC PANEL
BUN: 9 mg/dL (ref 6–23)
CO2: 24 mEq/L (ref 19–32)
Calcium: 8.6 mg/dL (ref 8.4–10.5)
Chloride: 93 mEq/L — ABNORMAL LOW (ref 96–112)
Creatinine, Ser: 0.67 mg/dL (ref 0.50–1.35)

## 2013-09-09 LAB — TYPE AND SCREEN: Antibody Screen: NEGATIVE

## 2013-09-09 LAB — CBC
HCT: 37 % — ABNORMAL LOW (ref 39.0–52.0)
Hemoglobin: 13.3 g/dL (ref 13.0–17.0)
MCHC: 35.9 g/dL (ref 30.0–36.0)
WBC: 4.4 10*3/uL (ref 4.0–10.5)

## 2013-09-09 LAB — PROTIME-INR
INR: 1.03 (ref 0.00–1.49)
Prothrombin Time: 13.3 seconds (ref 11.6–15.2)

## 2013-09-09 LAB — HEMOGLOBIN AND HEMATOCRIT, BLOOD
HCT: 29.9 % — ABNORMAL LOW (ref 39.0–52.0)
HCT: 29.3 % — ABNORMAL LOW (ref 39.0–52.0)
Hemoglobin: 11.3 g/dL — ABNORMAL LOW (ref 13.0–17.0)
Hemoglobin: 11 g/dL — ABNORMAL LOW (ref 13.0–17.0)

## 2013-09-09 LAB — APTT
aPTT: 32 seconds (ref 24–37)
aPTT: 31 seconds (ref 24–37)

## 2013-09-09 LAB — MRSA PCR SCREENING: MRSA by PCR: NEGATIVE

## 2013-09-09 LAB — ABO/RH: ABO/RH(D): O POS

## 2013-09-09 LAB — TROPONIN I
Troponin I: <0.3 ng/mL (ref <0.30)
Troponin I: <0.3 ng/mL (ref <0.30)

## 2013-09-09 MED ORDER — FLUDROCORTISONE ACETATE 0.1 MG PO TABS
0.1000 mg | ORAL_TABLET | Freq: Every day | ORAL | Status: DC
  Administered 2013-09-09 – 2013-09-11 (×3): 0.1 mg via ORAL
  Filled 2013-09-09 (×3): qty 1

## 2013-09-09 MED ORDER — AMLODIPINE BESYLATE 10 MG PO TABS: 10.0000 mg | ORAL_TABLET | Freq: Every day | ORAL | Status: DC

## 2013-09-09 MED ORDER — SODIUM CHLORIDE 0.9 % IV SOLN
INTRAVENOUS | Status: DC
  Administered 2013-09-09: 08:00:00 via INTRAVENOUS

## 2013-09-09 MED ORDER — DEMECLOCYCLINE HCL 150 MG PO TABS
150.0000 mg | ORAL_TABLET | Freq: Two times a day (BID) | ORAL | Status: DC
  Administered 2013-09-09 – 2013-09-11 (×4): 150 mg via ORAL
  Filled 2013-09-09 (×5): qty 1

## 2013-09-09 MED ORDER — POTASSIUM 99 MG PO TABS: 1.0000 | ORAL_TABLET | Freq: Every day | ORAL | Status: DC

## 2013-09-09 MED ORDER — SODIUM CHLORIDE 0.9 % IV SOLN: INTRAVENOUS | Status: DC

## 2013-09-09 MED ORDER — ATENOLOL 100 MG PO TABS: 100.0000 mg | ORAL_TABLET | Freq: Every day | ORAL | Status: DC

## 2013-09-09 MED ORDER — HYDROCORTISONE SOD SUCCINATE 100 MG IJ SOLR
50.0000 mg | Freq: Four times a day (QID) | INTRAMUSCULAR | Status: DC
  Administered 2013-09-09 – 2013-09-10 (×4): 50 mg via INTRAVENOUS
  Filled 2013-09-09 (×7): qty 1

## 2013-09-09 MED ORDER — MIDAZOLAM HCL 2 MG/2ML IJ SOLN
INTRAMUSCULAR | Status: AC
  Filled 2013-09-09: qty 2

## 2013-09-09 MED ORDER — PANTOPRAZOLE SODIUM 40 MG PO TBEC
40.0000 mg | DELAYED_RELEASE_TABLET | Freq: Every day | ORAL | Status: DC
  Administered 2013-09-09 – 2013-09-11 (×3): 40 mg via ORAL
  Filled 2013-09-09 (×3): qty 1

## 2013-09-09 MED ORDER — TAMSULOSIN HCL 0.4 MG PO CAPS
0.4000 mg | ORAL_CAPSULE | Freq: Two times a day (BID) | ORAL | Status: DC
  Administered 2013-09-09 – 2013-09-11 (×4): 0.4 mg via ORAL
  Filled 2013-09-09 (×6): qty 1

## 2013-09-09 MED ORDER — LORAZEPAM 2 MG/ML IJ SOLN: INTRAMUSCULAR | Status: AC | PRN

## 2013-09-09 MED ORDER — HYDROCODONE-ACETAMINOPHEN 5-325 MG PO TABS
1.0000 | ORAL_TABLET | ORAL | Status: DC | PRN
  Administered 2013-09-09: 2 via ORAL
  Filled 2013-09-09 (×2): qty 2
  Filled 2013-09-09: qty 1

## 2013-09-09 MED ORDER — HYDROCODONE-ACETAMINOPHEN 5-325 MG PO TABS
ORAL_TABLET | ORAL | Status: AC
  Filled 2013-09-09: qty 2

## 2013-09-09 MED ORDER — FENTANYL CITRATE 0.05 MG/ML IJ SOLN
INTRAMUSCULAR | Status: AC | PRN
  Administered 2013-09-09 (×2): 25 ug via INTRAVENOUS

## 2013-09-09 MED ORDER — MIDAZOLAM HCL 2 MG/2ML IJ SOLN
INTRAMUSCULAR | Status: AC | PRN
  Administered 2013-09-09 (×2): 0.5 mg via INTRAVENOUS

## 2013-09-09 MED ORDER — ALPRAZOLAM 0.25 MG PO TABS
0.2500 mg | ORAL_TABLET | ORAL | Status: DC | PRN
  Administered 2013-09-09 – 2013-09-10 (×2): 0.25 mg via ORAL
  Filled 2013-09-09 (×2): qty 1

## 2013-09-09 MED ORDER — IRBESARTAN 75 MG PO TABS: 75.0000 mg | ORAL_TABLET | Freq: Every day | ORAL | Status: DC

## 2013-09-09 MED ORDER — LIDOCAINE HCL 1 % IJ SOLN
INTRAMUSCULAR | Status: AC
  Administered 2013-09-09: 10 mL
  Filled 2013-09-09: qty 10

## 2013-09-09 MED ORDER — DEMECLOCYCLINE HCL 150 MG PO TABS: 150.0000 mg | ORAL_TABLET | Freq: Two times a day (BID) | ORAL | Status: DC

## 2013-09-09 MED ORDER — SODIUM CHLORIDE 0.9 % IV SOLN
INTRAVENOUS | Status: DC
  Administered 2013-09-09 – 2013-09-10 (×2): via INTRAVENOUS

## 2013-09-09 MED ORDER — FENTANYL CITRATE 0.05 MG/ML IJ SOLN
INTRAMUSCULAR | Status: AC
  Filled 2013-09-09: qty 2

## 2013-09-09 NOTE — Progress Notes (Addendum)
Patient is s/p left adrenal mass biopsy. He was complaining of left sided back pain. CT ordered and revealed diffuse soft tissue stranding compatible with hemorrhage in the left parirenal retroperitoneal space. Images reviewed by Dr. Deanne Coffer and patient is being admitted for observation with serial labs. Dr. Shelle Iron is aware of patient's status and events today, CCM will be involved per Dr. Shelle Iron.   Pattricia Boss PA-C Interventional Radiology  09/09/13  1:55 PM

## 2013-09-09 NOTE — Progress Notes (Signed)
rec'd from CT , orders reviewed T&S and H&H sent per order, see VS flow sheet

## 2013-09-09 NOTE — Progress Notes (Signed)
Report to Tobi Bastos RN transferred to [phone] via stretcher with belongings.

## 2013-09-09 NOTE — Procedures (Signed)
CT core biopsy LEFT adrenal 18g x3 to surg path No complication No blood loss. See complete dictation in [facility].

## 2013-09-09 NOTE — Progress Notes (Signed)
  Subjective: Post L adrenal mass biopsy Called by RN to evaluate pt Pt is complaining of 5-6/10 L back pain- especially with deep breath. No SOB Drop in BP to 83/43 and 89/49   Objective: Vital signs in last 24 hours: Temp:  [97.5 F (36.4 C)-97.9 F (36.6 C)] 97.5 F (36.4 C) (10/06 1104) Pulse Rate:  [57-61] 58 (10/06 1246) Resp:  [12-21] 18 (10/06 1246) BP: (77-144)/(43-72) 108/52 mmHg (10/06 1246) SpO2:  [94 %-100 %] 100 % (10/06 1246) Weight:  [154 lb (69.854 kg)] 154 lb (69.854 kg) (10/06 0754)    Intake/Output from previous day:   Intake/Output this shift:    PE: afeb; VSS BP improving Now 108/52 Site clean and dry; NT No hematoma In NAD  Lab Results:   Recent Labs  09/09/13 0744  WBC 4.4  HGB 13.3  HCT 37.0*  PLT 258   BMET No results found for this basename: NA, K, CL, CO2, GLUCOSE, BUN, CREATININE, CALCIUM,  in the last 72 hours PT/INR  Recent Labs  09/09/13 0744  LABPROT 13.3  INR 1.03   ABG No results found for this basename: PHART, PCO2, PO2, HCO3,  in the last 72 hours  Studies/Results: Ct Biopsy  09/09/2013   CLINICAL DATA:  Hypermetabolic left pulmonary nodules and left adrenal nodule.  EXAM: CT GUIDED CORE BIOPSY OF LEFT ADRENAL  ANESTHESIA/SEDATION: Intravenous Fentanyl and Versed were administered as conscious sedation during continuous cardiorespiratory monitoring by the radiology RN, with a total moderate sedation time of 15 minutes.  PROCEDURE: The procedure risks, benefits, and alternatives were explained to the patient. Questions regarding the procedure were encouraged and answered. The patient understands and consents to the procedure.  Patient placed in left lateral decubitus position. Limited scanning through the upper abdomen was performed. The left adrenal lesion was localized and an appropriate skin entry site determine. Operator donned sterile gloves and mask. Site was marked, prepped with Betadine, draped in usual sterile  fashion, infiltrated locally with 1% lidocaine. Under CT fluoroscopic guidance, a 17 gauge trocar needle was advanced to the margin of the lesion. Once needle tip position was confirmed, coaxial 18-gauge core biopsy samples were obtained, submitted in formalin to surgical pathology. The guide needle was removed. Postprocedure scans show minimal regional alveolar hemorrhage. The patient tolerated the procedure well.  Complications: None  IMPRESSION: Technically successful CT-guided core biopsy of left adrenal nodule.   Electronically Signed   By: Oley Balm M.D.   On: 09/09/2013 10:55    Anti-infectives: Anti-infectives   None      Assessment/Plan: s/p * No surgery found *  Call to Dr Hassell-Informed of pt status CT Abd w/o cx stat IV fluids to 50cc/hr   LOS: 0 days    Ho Parisi A 09/09/2013

## 2013-09-09 NOTE — H&P (Signed)
Anthony Curry is an 72 y.o. male.   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 Hypertension   . GERD (gastroesophageal reflux disease)   . Alcohol abuse   . 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 Low sodium     Past Surgical History  Procedure Laterality Date  . Hemorrhoid surgery    . Ankle arthrodesis  1995    rt fx-hardware in  . Tonsillectomy    . Colonoscopy    . Excision / curettage bone cyst finger  2005    rt thumb  . Total shoulder arthroplasty  02/03/2012    Procedure: TOTAL SHOULDER ARTHROPLASTY;  Surgeon: Eulas Post, MD;  Location: [facility];  Service: Orthopedics;  Laterality: Left;    Family History  Problem Relation Age of Onset  . Brain cancer Brother   . Lung cancer Mother   . Hypertension Father   . Diabetes Paternal Grandmother    Social History:  reports that he quit smoking about 29 years ago. His smoking use included Cigarettes. He has a 60 pack-year smoking history. He does not have any smokeless tobacco history on file. He reports that  drinks alcohol. He reports that he does not use illicit drugs.  Allergies: No Known Allergies   (Not in a hospital admission)  Results for orders placed during the hospital encounter of 09/09/13 (from the past 48 hour(s))  APTT     Status: None   Collection Time    09/09/13  7:44 AM      Result Value Range   aPTT 32  24 - 37 seconds  CBC     Status: Abnormal   Collection Time    09/09/13  7:44 AM      Result Value Range   WBC 4.4  4.0 - 10.5 K/uL   RBC 3.87 (*) 4.22 - 5.81 MIL/uL   Hemoglobin 13.3  13.0 - 17.0 g/dL   HCT 16.1 (*) 09.6 - 04.5 %   MCV 95.6  78.0 - 100.0 fL   MCH 34.4  (*) 26.0 - 34.0 pg   MCHC 35.9  30.0 - 36.0 g/dL   RDW 40.9  81.1 - 91.4 %   Platelets 258  150 - 400 K/uL  PROTIME-INR     Status: None   Collection Time    09/09/13  7:44 AM      Result Value Range   Prothrombin Time 13.3  11.6 - 15.2 seconds   INR 1.03  0.00 - 1.49   No results found.  Review of Systems  Constitutional: Negative for fever and weight loss.  Respiratory: Negative for cough and shortness of breath.   Cardiovascular: Negative for chest pain.  Gastrointestinal: Negative for nausea, vomiting and abdominal pain.  Neurological: Positive for weakness. Negative for dizziness and headaches.       Light headed occasionally at home    Blood pressure 144/69, pulse 61, temperature 97.8 F (36.6 C), temperature source Oral, resp. rate 18, height 6' (1.829 m), weight 154 lb (69.854 kg), SpO2 96.00%. Physical Exam  Constitutional: He is oriented to person, place, and time. He appears well-nourished.  Cardiovascular: Normal rate, regular rhythm and normal  heart sounds.   No murmur heard. Respiratory: Effort normal and breath sounds normal. He has no wheezes.  GI: Soft. Bowel sounds are normal. There is no tenderness.  Musculoskeletal: Normal range of motion.  Neurological: He is alert and oriented to person, place, and time.  Skin: Skin is warm and dry.  Psychiatric: He has a normal mood and affect. His behavior is normal. Judgment and thought content normal.     Assessment/Plan Recent change/enlargement in known pulmonary nodules per Dr Shelle Iron +PET L pulm nodules and L adrenal mas Scheduled now for L adrenal mass bx Pt and family aware of procedure benefits and risks and agreeable to proceed Consent signed and in chart  Jermaine Tholl A 09/09/2013, 8:30 AM

## 2013-09-09 NOTE — Progress Notes (Signed)
Called Beckey Downing, PA at 1140 about patient's low blood pressure.  PamTurpin, PA came to see patient and ordered a CT of abdomen and to restart IV fluid.  Patient went for CT and was not brought back to the unit, but instead was admitted for abnormal findings.

## 2013-09-09 NOTE — H&P (Addendum)
PULMONARY  / CRITICAL CARE MEDICINE  Name: Anthony Curry MRN: [id number] DOB: 01-Dec-1941    ADMISSION DATE:  09/09/2013 CONSULTATION DATE:  10/6  REFERRING MD :  10/6 PRIMARY SERVICE: PCCM   CHIEF COMPLAINT:  Shock   BRIEF PATIENT DESCRIPTION:  This is a 72 year old male who presented to IR on 10/6 for elective left adrenal mass biopsy in effort to work up what was thought likely to be metastatic bronchogenic cancer. Post-op found to have left flank pain and was hypotensive in 80s. CT abd/pelvis obtained: c/w left adrenal hemorrhage. PCCM asked to see post-op for management of hypotension.   SIGNIFICANT EVENTS / STUDIES:  CT left adrenal bx 10/6 CT abd 10/6: . Diffuse soft tissue stranding compatible with hemorrhage in the left parirenal retroperitoneal space.  2. 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 3. Extensive atherosclerotic changes. 4. Displacement of the spleen. 5. Right adrenal adenoma.    LINES / TUBES:   CULTURES:   ANTIBIOTICS:   HISTORY OF PRESENT ILLNESS:   This is a 72 year old male who presented to IR on 10/6 for elective left adrenal mass biopsy in effort to work up what was thought likely to be metastatic bronchogenic cancer. Post-op found to have left flank pain and was hypotensive in 80s. CT abd/pelvis obtained: c/w left adrenal hemorrhage. PCCM asked to see post-op for management of hypotension.  PAST MEDICAL HISTORY :  Past Medical History  Diagnosis Date  . Hypertension   . GERD (gastroesophageal reflux disease)   . Alcohol abuse   . 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 Low sodium    Past Surgical History  Procedure Laterality Date  . Hemorrhoid surgery    . Ankle arthrodesis  1995    rt fx-hardware in  . Tonsillectomy    . Colonoscopy    . Excision / curettage bone cyst finger  2005    rt thumb  . Total shoulder arthroplasty  02/03/2012    Procedure: TOTAL SHOULDER ARTHROPLASTY;  Surgeon: Eulas Post, MD;   Location: [facility];  Service: Orthopedics;  Laterality: Left;   Prior to Admission medications   Medication Sig Start Date End Date Taking? Authorizing Provider  ALPRAZolam (XANAX) 0.25 MG tablet Take 0.25 mg by mouth as needed for sleep.   Yes Historical Provider, MD  amLODipine (NORVASC) 10 MG tablet Take 10 mg by mouth daily.    Yes Historical Provider, MD  Ascorbic Acid (VITAMIN C PO) Take 1 tablet by mouth daily.   Yes Historical Provider, MD  atenolol (TENORMIN) 100 MG tablet Take 100 mg by mouth daily.    Yes Historical Provider, MD  b complex vitamins tablet Take 1 tablet by mouth daily.   Yes Historical Provider, MD  calcium carbonate (OS-CAL) 600 MG TABS Take 600 mg by mouth daily with breakfast.   Yes Historical Provider, MD  Cholecalciferol (VITAMIN D-3) 5000 UNITS TABS Take 5,000 Units by mouth daily.    Yes Historical Provider, MD  demeclocycline (DECLOMYCIN) 150 MG tablet Take 150 mg by mouth 2 (two) times daily.   Yes Historical Provider, MD  feeding supplement (ENSURE COMPLETE) LIQD Take 237 mLs by mouth daily. 07/08/13  Yes Laveda Norman, MD  MAGNESIUM PO Take 1 tablet by mouth daily.   Yes Historical Provider, MD  Multiple Vitamin (MULTIVITAMIN WITH MINERALS) TABS Take 1 tablet by mouth daily.   Yes Historical Provider, MD  olmesartan (BENICAR) 40 MG tablet  Take 40 mg by mouth daily.    Yes Historical Provider, MD  omeprazole (PRILOSEC) 20 MG capsule Take 20 mg by mouth daily.    Yes Historical Provider, MD  Potassium 99 MG TABS Take 1 tablet by mouth daily.   Yes Historical Provider, MD  tamsulosin (FLOMAX) 0.4 MG CAPS capsule Take 0.4 mg by mouth 2 (two) times daily.   Yes Historical Provider, MD  vitamin E 400 UNIT capsule Take 400 Units by mouth daily.   Yes Historical Provider, MD   No Known Allergies  FAMILY HISTORY:  Family History  Problem Relation Age of Onset  . Brain cancer Brother   . Lung cancer Mother   . Hypertension Father   . Diabetes  Paternal Grandmother    SOCIAL HISTORY:  reports that he quit smoking about 29 years ago. His smoking use included Cigarettes. He has a 60 pack-year smoking history. He does not have any smokeless tobacco history on file. He reports that  drinks alcohol. He reports that he does not use illicit drugs.  REVIEW OF SYSTEMS:  Review of Systems  Constitutional: No weight loss, gain, night sweats, Fevers, chills, fatigue .  HEENT: No headaches, visual changes, Difficulty swallowing, Tooth/dental problems, or Sore throat,  No sneezing, itching, ear ache, nasal congestion, post nasal drip, no visual complaints CV: No chest pain, Orthopnea, PND, swelling in lower extremities, dizziness, palpitations, syncope.  GI No heartburn, indigestion, abdominal pain, nausea, vomiting, diarrhea, change in bowel habits, loss of appetite, bloody stools.  Resp: No cough, No coughing up of blood. No change in color of mucus. No wheezing.  Skin: no rash or itching or icterus GU: no dysuria, change in color of urine, no urgency or frequency left flank pain , no hematuria  MS: No joint pain or swelling. No decreased range of motion  Psych: No change in mood or affect. No depression or anxiety.  Neuro: no difficulty with speech, weakness, numbness, ataxia   SUBJECTIVE:  Feels better  VITAL SIGNS: Temp:  [97.5 F (36.4 C)-97.9 F (36.6 C)] 97.5 F (36.4 C) (10/06 1104) Pulse Rate:  [53-61] 56 (10/06 1502) Resp:  [12-21] 14 (10/06 1419) BP: (77-144)/(43-72) 113/55 mmHg (10/06 1502) SpO2:  [94 %-100 %] 98 % (10/06 1442) Weight:  [69.854 kg (154 lb)] 69.854 kg (154 lb) (10/06 0754) HEMODYNAMICS:   VENTILATOR SETTINGS:   INTAKE / OUTPUT: Intake/Output   None     PHYSICAL EXAMINATION: General:  72 year old male  Neuro:  Awake, alert, no focal def , feels better  HEENT:  [place], no JVD.  Cardiovascular:  rrr Lungs:  Clear  Abdomen:  Soft, positive BS, no r/g Musculoskeletal:  Intact  Skin:  Intact    LABS:  CBC Recent Labs     09/09/13  0744  09/09/13  1324  WBC  4.4   --   HGB  13.3  11.9*  HCT  37.0*  29.9*  PLT  258   --    Coag's Recent Labs     09/09/13  0744  APTT  32  INR  1.03   BMET No results found for this basename: NA, K, CL, CO2, BUN, CREATININE, GLUCOSE,  in the last 72 hours Electrolytes No results found for this basename: CALCIUM, MG, PHOS,  in the last 72 hours Sepsis Markers No results found for this basename: LACTICACIDVEN, PROCALCITON, O2SATVEN,  in the last 72 hours ABG No results found for this basename: PHART, PCO2ART, PO2ART,  in the  last 72 hours Liver Enzymes No results found for this basename: AST, ALT, ALKPHOS, BILITOT, ALBUMIN,  in the last 72 hours Cardiac Enzymes No results found for this basename: TROPONINI, PROBNP,  in the last 72 hours Glucose No results found for this basename: GLUCAP,  in the last 72 hours  Imaging Ct Abdomen Wo Contrast  09/09/2013   CLINICAL DATA:  Status post adrenal biopsy with left-sided back pain.  EXAM: CT ABDOMEN WITHOUT CONTRAST  TECHNIQUE: Multidetector CT imaging of the abdomen was performed following the standard protocol without IV contrast.  COMPARISON:  PET scan 08/27/2013.  FINDINGS: A heterogeneous retroperitoneal hematoma is now present within the left para renal space. This is displacing the pancreas anteriorly and. The left kidney is displaced anteriorly and laterally although there does not appear to be any significant mass effect on the kidney. Right kidney is unremarkable.  Mild dependent atelectasis is present at the lung bases bilaterally. The heart size is normal. No significant pleural or pericardial effusion is present. The liver is within normal limits. The spleen is displaced laterally by the hematoma. Layering gallstones are present in the gallbladder. No inflammatory changes are present. The pancreas is otherwise unremarkable. A right adrenal adenoma is stable.  Bone windows demonstrate  marked and multilevel endplate degenerative changes. Numerous lytic lesions are present. These are predominate associate with the endplates and likely represent degenerative change.  IMPRESSION: 1. Diffuse soft tissue stranding compatible with hemorrhage in the left parirenal retroperitoneal space. 2. 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 3. Extensive atherosclerotic changes. 4. Displacement of the spleen. 5. Right adrenal adenoma.   Electronically Signed   By: Gennette Pac M.D.   On: 09/09/2013 13:35   Ct Biopsy  09/09/2013   CLINICAL DATA:  Hypermetabolic left pulmonary nodules and left adrenal nodule.  EXAM: CT GUIDED CORE BIOPSY OF LEFT ADRENAL  ANESTHESIA/SEDATION: Intravenous Fentanyl and Versed were administered as conscious sedation during continuous cardiorespiratory monitoring by the radiology RN, with a total moderate sedation time of 15 minutes.  PROCEDURE: The procedure risks, benefits, and alternatives were explained to the patient. Questions regarding the procedure were encouraged and answered. The patient understands and consents to the procedure.  Patient placed in left lateral decubitus position. Limited scanning through the upper abdomen was performed. The left adrenal lesion was localized and an appropriate skin entry site determine. Operator donned sterile gloves and mask. Site was marked, prepped with Betadine, draped in usual sterile fashion, infiltrated locally with 1% lidocaine. Under CT fluoroscopic guidance, a 17 gauge trocar needle was advanced to the margin of the lesion. Once needle tip position was confirmed, coaxial 18-gauge core biopsy samples were obtained, submitted in formalin to surgical pathology. The guide needle was removed. Postprocedure scans show minimal regional alveolar hemorrhage. The patient tolerated the procedure well.  Complications: None  IMPRESSION: Technically successful CT-guided core biopsy of left adrenal nodule.   Electronically  Signed   By: Oley Balm M.D.   On: 09/09/2013 10:55    CXR: COPD changes  ASSESSMENT / PLAN:  PULMONARY A: Multiple bilateral ground glass pulmonary nodules  >concerning for bronchogenic cancer.  P:   F/u adrenal bx IS  CARDIOVASCULAR A:  Shock.  Think this is mix of hypovolemic/hemorrhagic and relative adrenal insuff.  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 hgb 13.3-->11.9 Left adrenal hemorrhage/ Retroperitoneal bleed  P:  Trend H&H Hold blood thinners and ASA  Transfuse if develops shock Ensure 18 gauge or bigger ensure type and cross  INFECTIOUS A:  No acute issue  P:   Trend cbc Trend fever curve   ENDOCRINE A:  H/o SIADH, ? 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  P:   Check chemistry Careful w/ NS Cont home demecycline  NEUROLOGIC A:  Post-op pain P:   PRn analgesia   Will admit to SDU, trend CBC, hydrate and give stress dose steroids. As long as bleeding stabilizes hope all will need is stress dose steroids and time.   I have personally obtained a history, examined the patient, evaluated laboratory and imaging results, formulated the assessment and plan and placed orders.  Mcarthur Rossetti. Tyson Alias, MD, FACP Pgr: [phone]  Pulmonary & Critical Care  Pulmonary and Critical Care Medicine [facility] Pager: [phone]  09/09/2013, 3:24 PM

## 2013-09-10 DIAGNOSIS — I959 Hypotension, unspecified: Secondary | ICD-10-CM

## 2013-09-10 DIAGNOSIS — E279 Disorder of adrenal gland, unspecified: Secondary | ICD-10-CM | POA: Diagnosis not present

## 2013-09-10 DIAGNOSIS — E2749 Other adrenocortical insufficiency: Secondary | ICD-10-CM | POA: Diagnosis not present

## 2013-09-10 DIAGNOSIS — R918 Other nonspecific abnormal finding of lung field: Secondary | ICD-10-CM | POA: Diagnosis not present

## 2013-09-10 LAB — HEMOGLOBIN AND HEMATOCRIT, BLOOD
HCT: 29.1 % — ABNORMAL LOW (ref 39.0–52.0)
Hemoglobin: 10.1 g/dL — ABNORMAL LOW (ref 13.0–17.0)
Hemoglobin: 10.6 g/dL — ABNORMAL LOW (ref 13.0–17.0)

## 2013-09-10 LAB — BASIC METABOLIC PANEL
Calcium: 8.5 mg/dL (ref 8.4–10.5)
GFR calc Af Amer: >90 mL/min (ref >90)
GFR calc non Af Amer: >90 mL/min (ref >90)
Potassium: 3.8 mEq/L (ref 3.5–5.1)
Sodium: 127 mEq/L — ABNORMAL LOW (ref 135–145)

## 2013-09-10 LAB — CORTISOL: Cortisol, Plasma: 13.3 ug/dL

## 2013-09-10 LAB — TROPONIN I: Troponin I: <0.3 ng/mL (ref <0.30)

## 2013-09-10 NOTE — Progress Notes (Signed)
PULMONARY  / CRITICAL CARE MEDICINE  Name: Anthony Curry MRN: [id number] DOB: [birth year]    ADMISSION DATE:  09/09/2013 CONSULTATION DATE:  10/6  REFERRING MD :  10/6 PRIMARY SERVICE: PCCM   CHIEF COMPLAINT:  Shock   BRIEF PATIENT DESCRIPTION:  This is a 72 year old male who presented to IR on 10/6 for elective left adrenal mass biopsy in effort to work up what was thought likely to be metastatic bronchogenic cancer. Post-op found to have left flank pain and was hypotensive in 80s. CT abd/pelvis obtained: c/w left adrenal hemorrhage. PCCM asked to see post-op for management of hypotension.   SIGNIFICANT EVENTS / STUDIES:  CT left adrenal bx 10/6 CT abd 10/6: . Diffuse soft tissue stranding compatible with hemorrhage in the left parirenal retroperitoneal space.  2. 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 3. Extensive atherosclerotic changes. 4. Displacement of the spleen. 5. Right adrenal adenoma.  10-7 tx to floor  LINES / TUBES:   CULTURES:   ANTIBIOTICS:   HISTORY OF PRESENT ILLNESS:   This is a 72 year old male who presented to IR on 10/6 for elective left adrenal mass biopsy in effort to work up what was thought likely to be metastatic bronchogenic cancer. Post-op found to have left flank pain and was hypotensive in 80s. CT abd/pelvis obtained: c/w left adrenal hemorrhage. PCCM asked to see post-op for management of hypotension.    SUBJECTIVE:  Feels better  VITAL SIGNS: Temp:  [97.2 F (36.2 C)-98.2 F (36.8 C)] 98 F (36.7 C) (10/07 1240) Pulse Rate:  [53-69] 69 (10/07 1240) Resp:  [11-22] 15 (10/07 1240) BP: (82-144)/(47-82) 115/47 mmHg (10/07 1240) SpO2:  [94 %-100 %] 99 % (10/07 1240) Weight:  [154 lb 1.6 oz (69.9 kg)] 154 lb 1.6 oz (69.9 kg) (10/07 0452) HEMODYNAMICS:   VENTILATOR SETTINGS:   INTAKE / OUTPUT: Intake/Output     10/06 0701 - 10/07 0700 10/07 0701 - 10/08 0700   P.O.  480   I.V. (mL/kg) 1500 (21.5)    Total  Intake(mL/kg) 1500 (21.5) 480 (6.9)   Urine (mL/kg/hr) 600 225 (0.5)   Total Output 600 225   Net +900 +255          PHYSICAL EXAMINATION: General:  72 year old male sitting up eating Neuro:  Awake, alert, no focal def , feels better  HEENT:  [place], no JVD.  Cardiovascular:  rrr Lungs:  Clear  Abdomen:  Soft, positive BS, no r/g Musculoskeletal:  Intact  Skin:  Intact   LABS:  CBC Recent Labs     09/09/13  0744   09/09/13  1930  09/10/13  0137  09/10/13  0955  WBC  4.4   --    --    --    --   HGB  13.3   < >  11.0*  10.1*  10.6*  HCT  37.0*   < >  29.3*  27.7*  29.1*  PLT  258   --    --    --    --    < > = values in this interval not displayed.   Coag's Recent Labs     09/09/13  0744  09/09/13  1550  APTT  32  31  INR  1.03  1.03   BMET Recent Labs     09/09/13  1550  09/10/13  0955  NA  127*  127*  K  3.8  3.8  CL  93*  94*  CO2  24  21  BUN  9  8  CREATININE  0.67  0.53  GLUCOSE  120*  163*   Electrolytes Recent Labs     09/09/13  1550  09/10/13  0955  CALCIUM  8.6  8.5   Sepsis Markers No results found for this basename: LACTICACIDVEN, PROCALCITON, O2SATVEN,  in the last 72 hours ABG No results found for this basename: PHART, PCO2ART, PO2ART,  in the last 72 hours Liver Enzymes No results found for this basename: AST, ALT, ALKPHOS, BILITOT, ALBUMIN,  in the last 72 hours Cardiac Enzymes Recent Labs     09/09/13  1550  09/09/13  2106  09/10/13  0235  TROPONINI  <0.30  <0.30  <0.30   Glucose No results found for this basename: GLUCAP,  in the last 72 hours  Imaging Ct Abdomen Wo Contrast  09/09/2013   CLINICAL DATA:  Status post adrenal biopsy with left-sided back pain.  EXAM: CT ABDOMEN WITHOUT CONTRAST  TECHNIQUE: Multidetector CT imaging of the abdomen was performed following the standard protocol without IV contrast.  COMPARISON:  PET scan 08/27/2013.  FINDINGS: A heterogeneous retroperitoneal hematoma is now present within the left  para renal space. This is displacing the pancreas anteriorly and. The left kidney is displaced anteriorly and laterally although there does not appear to be any significant mass effect on the kidney. Right kidney is unremarkable.  Mild dependent atelectasis is present at the lung bases bilaterally. The heart size is normal. No significant pleural or pericardial effusion is present. The liver is within normal limits. The spleen is displaced laterally by the hematoma. Layering gallstones are present in the gallbladder. No inflammatory changes are present. The pancreas is otherwise unremarkable. A right adrenal adenoma is stable.  Bone windows demonstrate marked and multilevel endplate degenerative changes. Numerous lytic lesions are present. These are predominate associate with the endplates and likely represent degenerative change.  IMPRESSION: 1. Diffuse soft tissue stranding compatible with hemorrhage in the left parirenal retroperitoneal space. 2. 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 3. Extensive atherosclerotic changes. 4. Displacement of the spleen. 5. Right adrenal adenoma.   Electronically Signed   By: Gennette Pac M.D.   On: 09/09/2013 13:35   Ct Biopsy  09/09/2013   CLINICAL DATA:  Hypermetabolic left pulmonary nodules and left adrenal nodule.  EXAM: CT GUIDED CORE BIOPSY OF LEFT ADRENAL  ANESTHESIA/SEDATION: Intravenous Fentanyl and Versed were administered as conscious sedation during continuous cardiorespiratory monitoring by the radiology RN, with a total moderate sedation time of 15 minutes.  PROCEDURE: The procedure risks, benefits, and alternatives were explained to the patient. Questions regarding the procedure were encouraged and answered. The patient understands and consents to the procedure.  Patient placed in left lateral decubitus position. Limited scanning through the upper abdomen was performed. The left adrenal lesion was localized and an appropriate skin entry  site determine. Operator donned sterile gloves and mask. Site was marked, prepped with Betadine, draped in usual sterile fashion, infiltrated locally with 1% lidocaine. Under CT fluoroscopic guidance, a 17 gauge trocar needle was advanced to the margin of the lesion. Once needle tip position was confirmed, coaxial 18-gauge core biopsy samples were obtained, submitted in formalin to surgical pathology. The guide needle was removed. Postprocedure scans show minimal regional alveolar hemorrhage. The patient tolerated the procedure well.  Complications: None  IMPRESSION: Technically successful CT-guided core biopsy of left adrenal nodule.   Electronically  Signed   By: Oley Balm M.D.   On: 09/09/2013 10:55   Dg Chest Port 1 View  09/09/2013   CLINICAL DATA:  Hypotension. Status post biopsy of left adrenal nodule. Evaluate for pneumothorax. COPD.  EXAM: PORTABLE CHEST - 1 VIEW  COMPARISON:  08/31/2010  FINDINGS: No evidence of pneumothorax or pleural effusion. Changes of COPD are again noted. Heart size and mediastinal contours are within normal limits. Old left-sided rib fractures are seen. Old fracture deformity of right humeral neck noted.  IMPRESSION: COPD. No evidence of pneumothorax or other acute findings.   Electronically Signed   By: Myles Rosenthal M.D.   On: 09/09/2013 16:41    CXR: COPD changes  ASSESSMENT / PLAN:  PULMONARY A: Multiple bilateral ground glass pulmonary nodules  >concerning for bronchogenic cancer.  P:   F/u adrenal bx IS  CARDIOVASCULAR A:  Shock.  Think this is mix of hypovolemic/hemorrhagic and relative adrenal insuff.  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 hgb 13.3-->11.9 Left adrenal hemorrhage/ Retroperitoneal bleed  Stable 10-7 P:  Trend H&H Hold blood thinners and ASA  Transfuse if develops shock Ensure 18 gauge or bigger ensure type and cross 10-7 tx to tele, dc solucortef, saline lock iv, DC in am if remains stable.   INFECTIOUS A:  No acute issue  P:   Trend cbc Trend fever curve   ENDOCRINE A:  H/o SIADH, ? 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  P:   Check chemistry Careful w/ NS Cont home demecycline  NEUROLOGIC A:  Post-op pain P:   PRn analgesia   Brett Canales Minor ACNP Adolph Pollack PCCM Pager [phone] till 3 pm If no answer page [phone] 09/10/2013, 1:34 PM  Will transfer to tele, d/c stress dose steroids, if stable overnight with no further hypotension will likely d/c AM.  Patient seen and examined, agree with above note.  I dictated the care and orders written for this patient under my direction.  Alyson Reedy, MD [phone]

## 2013-09-10 NOTE — Progress Notes (Signed)
Utilization Review Completed.  

## 2013-09-10 NOTE — Progress Notes (Signed)
Subjective: Patient s/p Left adrenal mass biopsy 10/6 with post procedure hypotension and hemorrhage in the left retroperitoneal space. Patient observed overnight Hgb today 10.1 now 10.6 Hgb 10/6 pre procedure 13.3 Patient denies any left flank pain. He denies N/V, lightheadness or dizziness. He denies chest pain or shortness of breath.  Objective: Physical Exam: BP 124/56  Pulse 63  Temp(Src) 97.2 F (36.2 C) (Oral)  Resp 22  Ht 6\' 2"  (1.88 m)  Wt 154 lb 1.6 oz (69.9 kg)  BMI 19.78 kg/m2  SpO2 100%  Lungs: CTA bilaterally. Abd: soft, NT, ND (+) BS Back: site bandage intact, no signs of bleeding, hematoma or infection. No pain to palpation.   Labs: CBC  Recent Labs  09/09/13 0744  09/10/13 0137 09/10/13 0955  WBC 4.4  --   --   --   HGB 13.3  < > 10.1* 10.6*  HCT 37.0*  < > 27.7* 29.1*  PLT 258  --   --   --   < > = values in this interval not displayed. BMET  Recent Labs  09/09/13 1550 09/10/13 0955  NA 127* 127*  K 3.8 3.8  CL 93* 94*  CO2 24 21  GLUCOSE 120* 163*  BUN 9 8  CREATININE 0.67 0.53  CALCIUM 8.6 8.5   LFT No results found for this basename: PROT, ALBUMIN, AST, ALT, ALKPHOS, BILITOT, BILIDIR, IBILI, LIPASE,  in the last 72 hours PT/INR  Recent Labs  09/09/13 0744 09/09/13 1550  LABPROT 13.3 13.3  INR 1.03 1.03     Studies/Results: Ct Abdomen Wo Contrast  09/09/2013   CLINICAL DATA:  Status post adrenal biopsy with left-sided back pain.  EXAM: CT ABDOMEN WITHOUT CONTRAST  TECHNIQUE: Multidetector CT imaging of the abdomen was performed following the standard protocol without IV contrast.  COMPARISON:  PET scan 08/27/2013.  FINDINGS: A heterogeneous retroperitoneal hematoma is now present within the left para renal space. This is displacing the pancreas anteriorly and. The left kidney is displaced anteriorly and laterally although there does not appear to be any significant mass effect on the kidney. Right kidney is unremarkable.  Mild  dependent atelectasis is present at the lung bases bilaterally. The heart size is normal. No significant pleural or pericardial effusion is present. The liver is within normal limits. The spleen is displaced laterally by the hematoma. Layering gallstones are present in the gallbladder. No inflammatory changes are present. The pancreas is otherwise unremarkable. A right adrenal adenoma is stable.  Bone windows demonstrate marked and multilevel endplate degenerative changes. Numerous lytic lesions are present. These are predominate associate with the endplates and likely represent degenerative change.  IMPRESSION: 1. Diffuse soft tissue stranding compatible with hemorrhage in the left parirenal retroperitoneal space. 2. 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 3. Extensive atherosclerotic changes. 4. Displacement of the spleen. 5. Right adrenal adenoma.   Electronically Signed   By: Gennette Pac M.D.   On: 09/09/2013 13:35   Ct Biopsy  09/09/2013   CLINICAL DATA:  Hypermetabolic left pulmonary nodules and left adrenal nodule.  EXAM: CT GUIDED CORE BIOPSY OF LEFT ADRENAL  ANESTHESIA/SEDATION: Intravenous Fentanyl and Versed were administered as conscious sedation during continuous cardiorespiratory monitoring by the radiology RN, with a total moderate sedation time of 15 minutes.  PROCEDURE: The procedure risks, benefits, and alternatives were explained to the patient. Questions regarding the procedure were encouraged and answered. The patient understands and consents to the procedure.  Patient placed in  left lateral decubitus position. Limited scanning through the upper abdomen was performed. The left adrenal lesion was localized and an appropriate skin entry site determine. Operator donned sterile gloves and mask. Site was marked, prepped with Betadine, draped in usual sterile fashion, infiltrated locally with 1% lidocaine. Under CT fluoroscopic guidance, a 17 gauge trocar needle was  advanced to the margin of the lesion. Once needle tip position was confirmed, coaxial 18-gauge core biopsy samples were obtained, submitted in formalin to surgical pathology. The guide needle was removed. Postprocedure scans show minimal regional alveolar hemorrhage. The patient tolerated the procedure well.  Complications: None  IMPRESSION: Technically successful CT-guided core biopsy of left adrenal nodule.   Electronically Signed   By: Oley Balm M.D.   On: 09/09/2013 10:55   Dg Chest Port 1 View  09/09/2013   CLINICAL DATA:  Hypotension. Status post biopsy of left adrenal nodule. Evaluate for pneumothorax. COPD.  EXAM: PORTABLE CHEST - 1 VIEW  COMPARISON:  08/31/2010  FINDINGS: No evidence of pneumothorax or pleural effusion. Changes of COPD are again noted. Heart size and mediastinal contours are within normal limits. Old left-sided rib fractures are seen. Old fracture deformity of right humeral neck noted.  IMPRESSION: COPD. No evidence of pneumothorax or other acute findings.   Electronically Signed   By: Myles Rosenthal M.D.   On: 09/09/2013 16:41    Assessment/Plan: Left adrenal mass s/p left adrenal biopsy 10/6 Post procedure left retroperitoneal hemorrhage with hypotension. Hgb stable, BP stable. Patient asymptomatic. Plan for CCM, appreciate their assistance. Will report to Dr. Deanne Coffer.    LOS: 1 day    Berneta Levins PA-C 09/10/2013 11:54 AM

## 2013-09-11 ENCOUNTER — Telehealth: Payer: Self-pay | Admitting: Pulmonary Disease

## 2013-09-11 DIAGNOSIS — E871 Hypo-osmolality and hyponatremia: Secondary | ICD-10-CM

## 2013-09-11 DIAGNOSIS — F1010 Alcohol abuse, uncomplicated: Secondary | ICD-10-CM

## 2013-09-11 LAB — BASIC METABOLIC PANEL
BUN: 11 mg/dL (ref 6–23)
CO2: 23 mEq/L (ref 19–32)
Calcium: 8.2 mg/dL — ABNORMAL LOW (ref 8.4–10.5)
Creatinine, Ser: 0.57 mg/dL (ref 0.50–1.35)
GFR calc non Af Amer: >90 mL/min (ref >90)
Glucose, Bld: 86 mg/dL (ref 70–99)
Sodium: 133 mEq/L — ABNORMAL LOW (ref 135–145)

## 2013-09-11 LAB — PROTIME-INR: Prothrombin Time: 13.5 seconds (ref 11.6–15.2)

## 2013-09-11 LAB — HEMOGLOBIN AND HEMATOCRIT, BLOOD
HCT: 24 % — ABNORMAL LOW (ref 39.0–52.0)
HCT: 25.9 % — ABNORMAL LOW (ref 39.0–52.0)
Hemoglobin: 8.9 g/dL — ABNORMAL LOW (ref 13.0–17.0)

## 2013-09-11 LAB — APTT: aPTT: 30 seconds (ref 24–37)

## 2013-09-11 MED ORDER — POTASSIUM CHLORIDE CRYS ER 20 MEQ PO TBCR
30.0000 meq | EXTENDED_RELEASE_TABLET | Freq: Two times a day (BID) | ORAL | Status: DC
  Administered 2013-09-11: 30 meq via ORAL
  Filled 2013-09-11 (×2): qty 1

## 2013-09-11 MED ORDER — ATENOLOL 100 MG PO TABS
100.0000 mg | ORAL_TABLET | Freq: Every day | ORAL | Status: DC
  Administered 2013-09-11: 100 mg via ORAL
  Filled 2013-09-11: qty 1

## 2013-09-11 MED ORDER — FOLIC ACID 1 MG PO TABS: 1.0000 mg | ORAL_TABLET | Freq: Every day | ORAL | Status: DC

## 2013-09-11 MED ORDER — AMLODIPINE BESYLATE 10 MG PO TABS
10.0000 mg | ORAL_TABLET | Freq: Every day | ORAL | Status: DC
  Administered 2013-09-11: 10 mg via ORAL
  Filled 2013-09-11: qty 1

## 2013-09-11 MED ORDER — VITAMIN B-1 100 MG PO TABS: 100.0000 mg | ORAL_TABLET | Freq: Every day | ORAL | Status: DC

## 2013-09-11 MED ORDER — ALPRAZOLAM 0.25 MG PO TABS: 0.2500 mg | ORAL_TABLET | Freq: Three times a day (TID) | ORAL | Status: DC

## 2013-09-11 MED ORDER — HYDROCODONE-ACETAMINOPHEN 5-325 MG PO TABS: 1.0000 | ORAL_TABLET | ORAL | Status: DC | PRN

## 2013-09-11 NOTE — Discharge Summary (Signed)
Physician Discharge Summary     Patient ID: Anthony Curry MRN: [id number] DOB/AGE: 07/18/1941 72 y.o.  Admit date: 09/09/2013 Discharge date: 09/11/2013  Discharge Diagnoses:  Metastatic adenocarcinoma of the Lung Adrenal hemorrhage/ Retroperitoneal bleed s/p adrenal biopsy Acute blood loss anemia  Hypovolemic (hemmorhagic)/ and distributive shock(adrenal insufficiency) Adrenal insufficiency SIADH Anxiety  H/O ETOH abuse   Detailed Hospital Course:   This is a 72 year old male who presented to IR on 10/6 for elective left adrenal mass biopsy in effort to work up what was thought likely to be metastatic bronchogenic cancer. Post-op found to have left flank pain and was hypotensive in 80s. CT abd/pelvis obtained: c/w left adrenal hemorrhage. PCCM asked to see post-op for management of hypotension.   He was admitted to the SDU setting. Therapeutic interventions included: IV fluid bolus and infusion, stress dose steroids (hydrocortisone and florinef), and serial H&H checks. His BP stabilized. His solucortef was stopped on 10/7 and florinef was stopped 10/8.  His Hgb did drift down with one isolated reading of 8.9 on the am of 10/8, which was down from 10.6. We rechecked them, and f/u was 9.7. We felt this was more consistent with his physical exam. We checked orthostatics which were normal. At that time we felt it was safe to discharge him. His cytology was positive for adenocarcinoma. We did share this information with the patient. His plan of care at time of discharge is as below.      Discharge Plan by diagnoses  Metastatic adenocarcinoma of the lung.  Multiple bilateral ground glass pulmonary nodules w/ metastasis to left adrenal gland Plan Dr Shelle Iron will present his case to Multidisciplinary Oncology rounds on 10/16, further treatment/ referrals per this results.   Acute blood loss anemia. hgb 13.3-->11.9  Left adrenal hemorrhage/ Retroperitoneal bleed  Plan Advised to stay away from  NSAIDs Has f/u in our office to check CBC  H/o hyponatremia/ SIADH-->? D/t malignancy  Plan: Continue demecycline F/u bmp  H/o ETOH Plan: Thiamine and folate   Significant Hospital tests/ studies/ interventions and procedures CT left adrenal bx 10/6   CT abd 10/6: . Diffuse soft tissue stranding compatible with hemorrhage in the left parirenal retroperitoneal space.  2. 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 3. Extensive atherosclerotic changes. 4. Displacement of the spleen. 5. Right adrenal adenoma.  10-7 tx to floor  Discharge Exam: BP 126/71  Pulse 69  Temp(Src) 97.8 F (36.6 C) (Oral)  Resp 16  Ht 6\' 2"  (1.88 m)  Wt 69.9 kg (154 lb 1.6 oz)  BMI 19.78 kg/m2  SpO2 99%  PHYSICAL EXAMINATION:  General: 72 year old male no distress  Neuro: Awake, alert, no focal def , feels better  HEENT: [place], no JVD.  Cardiovascular: rrr  Lungs: Clear  Abdomen: Soft, positive BS, no r/g  Musculoskeletal: Intact  Skin: Intact    Labs at discharge Lab Results  Component Value Date   CREATININE 0.57 09/11/2013   BUN 11 09/11/2013   NA 133* 09/11/2013   K 3.4* 09/11/2013   CL 101 09/11/2013   CO2 23 09/11/2013   Lab Results  Component Value Date   WBC 4.4 09/09/2013   HGB 9.7* 09/11/2013   HCT 25.9* 09/11/2013   MCV 95.6 09/09/2013   PLT 258 09/09/2013   Lab Results  Component Value Date   ALT 16 07/05/2013   AST 26 07/05/2013   ALKPHOS 73 07/05/2013   BILITOT 1.0 07/05/2013   Lab  Results  Component Value Date   INR 1.05 09/11/2013   INR 1.03 09/09/2013   INR 1.03 09/09/2013    Current radiology studies Dg Chest Port 1 View  09/09/2013   CLINICAL DATA:  Hypotension. Status post biopsy of left adrenal nodule. Evaluate for pneumothorax. COPD.  EXAM: PORTABLE CHEST - 1 VIEW  COMPARISON:  08/31/2010  FINDINGS: No evidence of pneumothorax or pleural effusion. Changes of COPD are again noted. Heart size and mediastinal contours are within normal limits. Old  left-sided rib fractures are seen. Old fracture deformity of right humeral neck noted.  IMPRESSION: COPD. No evidence of pneumothorax or other acute findings.   Electronically Signed   By: Myles Rosenthal M.D.   On: 09/09/2013 16:41    Disposition:  01-Home or Self Care       Future Appointments Provider Department Dept Phone   09/13/2013 9:00 AM Barbaraann Share, MD [facility] [phone]       Medication List    STOP taking these medications       b complex vitamins tablet      TAKE these medications       ALPRAZolam 0.25 MG tablet  Commonly known as:  XANAX  Take 1 tablet (0.25 mg total) by mouth 3 (three) times daily.     ALPRAZolam 0.25 MG tablet  Commonly known as:  XANAX  Take 0.25 mg by mouth as needed for sleep.     amLODipine 10 MG tablet  Commonly known as:  NORVASC  Take 10 mg by mouth daily.     atenolol 100 MG tablet  Commonly known as:  TENORMIN  Take 100 mg by mouth daily.     calcium carbonate 600 MG Tabs tablet  Commonly known as:  OS-CAL  Take 600 mg by mouth daily with breakfast.     demeclocycline 150 MG tablet  Commonly known as:  DECLOMYCIN  Take 150 mg by mouth 2 (two) times daily.     feeding supplement (ENSURE COMPLETE) Liqd  Take 237 mLs by mouth daily.     folic acid 1 MG tablet  Commonly known as:  FOLVITE  Take 1 tablet (1 mg total) by mouth daily.     HYDROcodone-acetaminophen 5-325 MG per tablet  Commonly known as:  NORCO/VICODIN  Take 1-2 tablets by mouth every 4 (four) hours as needed.     MAGNESIUM PO  Take 1 tablet by mouth daily.     multivitamin with minerals Tabs tablet  Take 1 tablet by mouth daily.     olmesartan 40 MG tablet  Commonly known as:  BENICAR  Take 40 mg by mouth daily.     omeprazole 20 MG capsule  Commonly known as:  PRILOSEC  Take 20 mg by mouth daily.     Potassium 99 MG Tabs  Take 1 tablet by mouth daily.     tamsulosin 0.4 MG Caps capsule  Commonly known as:  FLOMAX  Take 0.4  mg by mouth 2 (two) times daily.     thiamine 100 MG tablet  Commonly known as:  VITAMIN B-1  Take 1 tablet (100 mg total) by mouth daily.     VITAMIN C PO  Take 1 tablet by mouth daily.     Vitamin D-3 5000 UNITS Tabs  Take 5,000 Units by mouth daily.     vitamin E 400 UNIT capsule  Take 400 Units by mouth daily.       Follow-up Information   Follow  up with Barbaraann Share, MD On 09/13/2013. (at 830 am chck-in then lab work w/ appointment at [facility] )    Specialty:  Pulmonary Disease   Contact information:   [address] [place] [address] [phone]       Discharged Condition: good  Physician Statement:   The Patient was personally examined, the discharge assessment and plan has been personally reviewed and I agree with ACNP Rettie Laird's assessment and plan. > 30 minutes of time have been dedicated to discharge assessment, planning and discharge instructions.   SignedAnders Simmonds 09/11/2013, 4:12 PM

## 2013-09-11 NOTE — Progress Notes (Addendum)
Subjective: Pt feels ok. Reports minimal discomfort left flank/back. Wants to go home. Has been OOB/walking in halls, denies dizziness.   Objective: Physical Exam: BP 122/59  Pulse 66  Temp(Src) 97.8 F (36.6 C) (Oral)  Resp 20  Ht 6\' 2"  (1.88 m)  Wt 154 lb 1.6 oz (69.9 kg)  BMI 19.78 kg/m2  SpO2 98% Site clean, no hematoma, NT    Labs: CBC  Recent Labs  09/09/13 0744  09/10/13 0955 09/11/13 0138  WBC 4.4  --   --   --   HGB 13.3  < > 10.6* 8.9*  HCT 37.0*  < > 29.1* 24.0*  PLT 258  --   --   --   < > = values in this interval not displayed. BMET  Recent Labs  09/10/13 0955 09/11/13 0513  NA 127* 133*  K 3.8 3.4*  CL 94* 101  CO2 21 23  GLUCOSE 163* 86  BUN 8 11  CREATININE 0.53 0.57  CALCIUM 8.5 8.2*   LFT No results found for this basename: PROT, ALBUMIN, AST, ALT, ALKPHOS, BILITOT, BILIDIR, IBILI, LIPASE,  in the last 72 hours PT/INR  Recent Labs  09/09/13 1550 09/11/13 0513  LABPROT 13.3 13.5  INR 1.03 1.05     Studies/Results: Ct Abdomen Wo Contrast  09/09/2013   CLINICAL DATA:  Status post adrenal biopsy with left-sided back pain.  EXAM: CT ABDOMEN WITHOUT CONTRAST  TECHNIQUE: Multidetector CT imaging of the abdomen was performed following the standard protocol without IV contrast.  COMPARISON:  PET scan 08/27/2013.  FINDINGS: A heterogeneous retroperitoneal hematoma is now present within the left para renal space. This is displacing the pancreas anteriorly and. The left kidney is displaced anteriorly and laterally although there does not appear to be any significant mass effect on the kidney. Right kidney is unremarkable.  Mild dependent atelectasis is present at the lung bases bilaterally. The heart size is normal. No significant pleural or pericardial effusion is present. The liver is within normal limits. The spleen is displaced laterally by the hematoma. Layering gallstones are present in the gallbladder. No inflammatory changes are present.  The pancreas is otherwise unremarkable. A right adrenal adenoma is stable.  Bone windows demonstrate marked and multilevel endplate degenerative changes. Numerous lytic lesions are present. These are predominate associate with the endplates and likely represent degenerative change.  IMPRESSION: 1. Diffuse soft tissue stranding compatible with hemorrhage in the left parirenal retroperitoneal space. 2. 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 3. Extensive atherosclerotic changes. 4. Displacement of the spleen. 5. Right adrenal adenoma.   Electronically Signed   By: Gennette Pac M.D.   On: 09/09/2013 13:35   Ct Biopsy  09/09/2013   CLINICAL DATA:  Hypermetabolic left pulmonary nodules and left adrenal nodule.  EXAM: CT GUIDED CORE BIOPSY OF LEFT ADRENAL  ANESTHESIA/SEDATION: Intravenous Fentanyl and Versed were administered as conscious sedation during continuous cardiorespiratory monitoring by the radiology RN, with a total moderate sedation time of 15 minutes.  PROCEDURE: The procedure risks, benefits, and alternatives were explained to the patient. Questions regarding the procedure were encouraged and answered. The patient understands and consents to the procedure.  Patient placed in left lateral decubitus position. Limited scanning through the upper abdomen was performed. The left adrenal lesion was localized and an appropriate skin entry site determine. Operator donned sterile gloves and mask. Site was marked, prepped with Betadine, draped in usual sterile fashion, infiltrated locally with 1% lidocaine. Under CT fluoroscopic  guidance, a 17 gauge trocar needle was advanced to the margin of the lesion. Once needle tip position was confirmed, coaxial 18-gauge core biopsy samples were obtained, submitted in formalin to surgical pathology. The guide needle was removed. Postprocedure scans show minimal regional alveolar hemorrhage. The patient tolerated the procedure well.  Complications:  None  IMPRESSION: Technically successful CT-guided core biopsy of left adrenal nodule.   Electronically Signed   By: Oley Balm M.D.   On: 09/09/2013 10:55   Dg Chest Port 1 View  09/09/2013   CLINICAL DATA:  Hypotension. Status post biopsy of left adrenal nodule. Evaluate for pneumothorax. COPD.  EXAM: PORTABLE CHEST - 1 VIEW  COMPARISON:  08/31/2010  FINDINGS: No evidence of pneumothorax or pleural effusion. Changes of COPD are again noted. Heart size and mediastinal contours are within normal limits. Old left-sided rib fractures are seen. Old fracture deformity of right humeral neck noted.  IMPRESSION: COPD. No evidence of pneumothorax or other acute findings.   Electronically Signed   By: Myles Rosenthal M.D.   On: 09/09/2013 16:41    Assessment/Plan: S/p left adrenal biopsy 10/6 Post procedure hypotension and hemorrhage. Hgb ahd been stable until this am, dropped to 8.9 BP stable, HR normal. Will recheck H/H again now and d/w PCCM regarding stability for discharge.    LOS: 2 days    Brayton El PA-C 09/11/2013 9:20 AM  Addendum:  Repeat Hgb back up to 9.7 Has remained stable all day Hopeful for discharge today  Brayton El PA-C Interventional Radiology 09/11/2013 2:49 PM

## 2013-09-11 NOTE — Discharge Summary (Signed)
Fully examined this pt See my progress note from today also Cancer Dx discussed with wife present Not orthostatic To se Dr Shelle Iron Friday with bmet (for Na) and cbc Full symptoms to watch out for that should prompt a calll to [place] described to family and pt  Mcarthur Rossetti. Tyson Alias, MD, FACP Pgr: [phone] [facility]

## 2013-09-11 NOTE — Progress Notes (Signed)
PULMONARY  / CRITICAL CARE MEDICINE  Name: Anthony Curry MRN: [id number] DOB: Sep 19, 1941    ADMISSION DATE:  09/09/2013 CONSULTATION DATE:  10/6  REFERRING MD :  10/6 PRIMARY SERVICE: PCCM   CHIEF COMPLAINT:  Shock   BRIEF PATIENT DESCRIPTION:  This is a 72 year old male who presented to IR on 10/6 for elective left adrenal mass biopsy in effort to work up what was thought likely to be metastatic bronchogenic cancer. Post-op found to have left flank pain and was hypotensive in 80s. CT abd/pelvis obtained: c/w left adrenal hemorrhage. PCCM asked to see post-op for management of hypotension.   SIGNIFICANT EVENTS / STUDIES:  CT left adrenal bx 10/6 CT abd 10/6: . Diffuse soft tissue stranding compatible with hemorrhage in the left parirenal retroperitoneal space.  2. 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 3. Extensive atherosclerotic changes. 4. Displacement of the spleen. 5. Right adrenal adenoma.  10-7 tx to floor  LINES / TUBES:   CULTURES:   ANTIBIOTICS:   HISTORY OF PRESENT ILLNESS:   This is a 72 year old male who presented to IR on 10/6 for elective left adrenal mass biopsy in effort to work up what was thought likely to be metastatic bronchogenic cancer. Post-op found to have left flank pain and was hypotensive in 80s. CT abd/pelvis obtained: c/w left adrenal hemorrhage. PCCM asked to see post-op for management of hypotension.    SUBJECTIVE:  Feels better  VITAL SIGNS: Temp:  [97.7 F (36.5 C)-98 F (36.7 C)] 97.8 F (36.6 C) (10/08 0548) Pulse Rate:  [66-80] 72 (10/08 1059) Resp:  [15-20] 20 (10/08 0548) BP: (115-126)/(47-70) 126/67 mmHg (10/08 1059) SpO2:  [98 %-99 %] 98 % (10/08 0548) HEMODYNAMICS:   VENTILATOR SETTINGS:   INTAKE / OUTPUT: Intake/Output     10/07 0701 - 10/08 0700 10/08 0701 - 10/09 0700   P.O. 960    I.V. (mL/kg)     Total Intake(mL/kg) 960 (13.7)    Urine (mL/kg/hr) 225 (0.1)    Total Output 225     Net +735           Urine Occurrence 3 x    Stool Occurrence 1 x      PHYSICAL EXAMINATION: General:  72 year old male no distress Neuro:  Awake, alert, no focal def , feels better  HEENT:  [place], no JVD.  Cardiovascular:  rrr Lungs:  Clear  Abdomen:  Soft, positive BS, no r/g Musculoskeletal:  Intact  Skin:  Intact   LABS:  CBC Recent Labs     09/09/13  0744   09/10/13  0137  09/10/13  0955  09/11/13  0138  WBC  4.4   --    --    --    --   HGB  13.3   < >  10.1*  10.6*  8.9*  HCT  37.0*   < >  27.7*  29.1*  24.0*  PLT  258   --    --    --    --    < > = values in this interval not displayed.   Coag's Recent Labs     09/09/13  0744  09/09/13  1550  09/11/13  0513  APTT  32  31  30  INR  1.03  1.03  1.05   BMET Recent Labs     09/09/13  1550  09/10/13  0955  09/11/13  0513  NA  127*  127*  133*  K  3.8  3.8  3.4*  CL  93*  94*  101  CO2  24  21  23   BUN  9  8  11   CREATININE  0.67  0.53  0.57  GLUCOSE  120*  163*  86   Electrolytes Recent Labs     09/09/13  1550  09/10/13  0955  09/11/13  0513  CALCIUM  8.6  8.5  8.2*   Sepsis Markers No results found for this basename: LACTICACIDVEN, PROCALCITON, O2SATVEN,  in the last 72 hours ABG No results found for this basename: PHART, PCO2ART, PO2ART,  in the last 72 hours Liver Enzymes No results found for this basename: AST, ALT, ALKPHOS, BILITOT, ALBUMIN,  in the last 72 hours Cardiac Enzymes Recent Labs     09/09/13  1550  09/09/13  2106  09/10/13  0235  TROPONINI  <0.30  <0.30  <0.30   Glucose No results found for this basename: GLUCAP,  in the last 72 hours  Imaging Ct Abdomen Wo Contrast  09/09/2013   CLINICAL DATA:  Status post adrenal biopsy with left-sided back pain.  EXAM: CT ABDOMEN WITHOUT CONTRAST  TECHNIQUE: Multidetector CT imaging of the abdomen was performed following the standard protocol without IV contrast.  COMPARISON:  PET scan 08/27/2013.  FINDINGS: A heterogeneous retroperitoneal  hematoma is now present within the left para renal space. This is displacing the pancreas anteriorly and. The left kidney is displaced anteriorly and laterally although there does not appear to be any significant mass effect on the kidney. Right kidney is unremarkable.  Mild dependent atelectasis is present at the lung bases bilaterally. The heart size is normal. No significant pleural or pericardial effusion is present. The liver is within normal limits. The spleen is displaced laterally by the hematoma. Layering gallstones are present in the gallbladder. No inflammatory changes are present. The pancreas is otherwise unremarkable. A right adrenal adenoma is stable.  Bone windows demonstrate marked and multilevel endplate degenerative changes. Numerous lytic lesions are present. These are predominate associate with the endplates and likely represent degenerative change.  IMPRESSION: 1. Diffuse soft tissue stranding compatible with hemorrhage in the left parirenal retroperitoneal space. 2. 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 3. Extensive atherosclerotic changes. 4. Displacement of the spleen. 5. Right adrenal adenoma.   Electronically Signed   By: Gennette Pac M.D.   On: 09/09/2013 13:35   Dg Chest Port 1 View  09/09/2013   CLINICAL DATA:  Hypotension. Status post biopsy of left adrenal nodule. Evaluate for pneumothorax. COPD.  EXAM: PORTABLE CHEST - 1 VIEW  COMPARISON:  08/31/2010  FINDINGS: No evidence of pneumothorax or pleural effusion. Changes of COPD are again noted. Heart size and mediastinal contours are within normal limits. Old left-sided rib fractures are seen. Old fracture deformity of right humeral neck noted.  IMPRESSION: COPD. No evidence of pneumothorax or other acute findings.   Electronically Signed   By: Myles Rosenthal M.D.   On: 09/09/2013 16:41    CXR: COPD changes  ASSESSMENT / PLAN:  PULMONARY A: Multiple bilateral ground glass pulmonary nodules   >concerning for bronchogenic cancer.  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  Dx d/w PT  CARDIOVASCULAR A:  Shock. (resolved): Think this was mix of hypovolemic/hemorrhagic and relative adrenal insuff.  HTN P:  Will resume home  As hgb stable, will order orthostatics and assess ambulation  RENAL A:   H/o hyponatremia/ SIADH Left adrenal mass (felt to likely be metastasis)  hypokalemia P:   Replace kcl Cont demecycline -NA rising Follow chemistry  Consider as outpt follow occurs  GASTROINTESTINAL A:  No acute issue  P:   Cont PPI (home med) Advance diet as tolerated   HEMATOLOGIC A:   Acute blood loss anemia. hgb 13.3-->11.9 Left adrenal hemorrhage/ Retroperitoneal bleed  Stable 10-7, but then dropped again from 10.6-->8.9: this may be dilutional, IVFs were not stopped till afternoon on 10/8 and not hypotensive, but this could still represent bleeding   P:  Repeat CBC ordered.  Hold blood thinners and ASA   INFECTIOUS A:  No acute issue  P:   Trend cbc Trend fever curve   ENDOCRINE A:   H/o SIADH, ? 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  Relative adrenal insufficiency: BP stable off hydrocort.  P:   Cont home demecycline Stop florinef   NEUROLOGIC A:  Post-op pain P:   Prn analgesia   If his hgb still low on f/u would prefer to hold another 24 hr w/ serial CBC checks  Need orthostatics and ambulation  Simonne Martinet, NP [phone]  I have fully examined this patient and agree with above findings.    And edited in full  Mcarthur Rossetti. Tyson Alias, MD, FACP Pgr: [phone]  Pulmonary & Critical Care

## 2013-09-11 NOTE — Progress Notes (Signed)
09/11/13 Nursing note  Patient given AVS and discharge instructions, and prescriptions sent to patients pharmacy. Will discharge home as ordered. Genecis Veley, [name] RN

## 2013-09-11 NOTE — Progress Notes (Signed)
Nursing note Anthony Curry [facility] obtained orthostatic vital signs while in patients room. Anthony Curry, [name]  rN

## 2013-09-12 ENCOUNTER — Other Ambulatory Visit: Payer: Self-pay | Admitting: Pulmonary Disease

## 2013-09-12 ENCOUNTER — Institutional Professional Consult (permissible substitution): Payer: Medicare Other | Admitting: Emergency Medicine

## 2013-09-12 DIAGNOSIS — R918 Other nonspecific abnormal finding of lung field: Secondary | ICD-10-CM

## 2013-09-12 DIAGNOSIS — E278 Other specified disorders of adrenal gland: Secondary | ICD-10-CM

## 2013-09-12 DIAGNOSIS — E2749 Other adrenocortical insufficiency: Secondary | ICD-10-CM

## 2013-09-12 NOTE — Telephone Encounter (Signed)
Done

## 2013-09-13 ENCOUNTER — Telehealth: Payer: Self-pay | Admitting: *Deleted

## 2013-09-13 ENCOUNTER — Ambulatory Visit (INDEPENDENT_AMBULATORY_CARE_PROVIDER_SITE_OTHER): Payer: Medicare Other | Admitting: Pulmonary Disease

## 2013-09-13 ENCOUNTER — Encounter: Payer: Self-pay | Admitting: Pulmonary Disease

## 2013-09-13 ENCOUNTER — Other Ambulatory Visit (INDEPENDENT_AMBULATORY_CARE_PROVIDER_SITE_OTHER): Payer: Medicare Other

## 2013-09-13 VITALS — BP 126/60 | HR 71 | Temp 97.6°F | Ht 72.0 in | Wt 158.4 lb

## 2013-09-13 DIAGNOSIS — I959 Hypotension, unspecified: Secondary | ICD-10-CM | POA: Diagnosis not present

## 2013-09-13 DIAGNOSIS — E2749 Other adrenocortical insufficiency: Secondary | ICD-10-CM | POA: Diagnosis not present

## 2013-09-13 DIAGNOSIS — E278 Other specified disorders of adrenal gland: Secondary | ICD-10-CM

## 2013-09-13 DIAGNOSIS — R918 Other nonspecific abnormal finding of lung field: Secondary | ICD-10-CM

## 2013-09-13 DIAGNOSIS — E279 Disorder of adrenal gland, unspecified: Secondary | ICD-10-CM

## 2013-09-13 LAB — BASIC METABOLIC PANEL
CO2: 29 mEq/L (ref 19–32)
Chloride: 96 mEq/L (ref 96–112)
Creatinine, Ser: 0.8 mg/dL (ref 0.4–1.5)
Potassium: 3.8 mEq/L (ref 3.5–5.1)
Sodium: 132 mEq/L — ABNORMAL LOW (ref 135–145)

## 2013-09-13 LAB — CBC WITH DIFFERENTIAL/PLATELET
Basophils Absolute: 0 10*3/uL (ref 0.0–0.1)
Basophils Relative: 0.4 % (ref 0.0–3.0)
Eosinophils Absolute: 0.3 10*3/uL (ref 0.0–0.7)
Hemoglobin: 11.1 g/dL — ABNORMAL LOW (ref 13.0–17.0)
Lymphocytes Relative: 36.2 % (ref 12.0–46.0)
MCHC: 34.9 g/dL (ref 30.0–36.0)
Monocytes Absolute: 0.6 10*3/uL (ref 0.1–1.0)
Monocytes Relative: 12.1 % — ABNORMAL HIGH (ref 3.0–12.0)
Neutrophils Relative %: 45.7 % (ref 43.0–77.0)
Platelets: 276 10*3/uL (ref 150.0–400.0)
RDW: 14.9 % — ABNORMAL HIGH (ref 11.5–14.6)

## 2013-09-13 NOTE — Assessment & Plan Note (Signed)
The patient had transient hypotension and drop in his hemoglobin after adrenal biopsy.  His hemoglobin and blood pressure were stable at discharge, and his blood work will be checked again today.  His blood pressure is excellent today and he denies any symptoms.

## 2013-09-13 NOTE — Telephone Encounter (Signed)
Called left vm message regarding appt time and place for [facility] 09/19/13

## 2013-09-13 NOTE — Assessment & Plan Note (Signed)
The patient's adrenal biopsy is positive for adenocarcinoma, which is metastatic from his lung abnormality.  At this point, he will be referred to the multidisciplinary thoracic oncology clinic for further evaluation.

## 2013-09-13 NOTE — Progress Notes (Signed)
  Subjective:    Patient ID: Anthony Curry, male    DOB: 12/12/1940, 72 y.o.   MRN: [id number]  HPI The patient comes in today for followup after his recent hospitalization.  He underwent needle aspiration of his adrenal abnormality under CT guidance, and had some post biopsy hemorrhage complicated by hypertension.  His blood pressure normalized, and his hemoglobin remained stable after an initial decrease.  The patient comes in today where he feels he is doing very well.  He has not felt lightheaded, and has no abdominal pain.  He feels that his breathing is at baseline.   Review of Systems  Constitutional: Negative for fever and unexpected weight change.  HENT: Positive for postnasal drip. Negative for congestion, dental problem, ear pain, nosebleeds, rhinorrhea, sinus pressure, sneezing, sore throat and trouble swallowing.   Eyes: Negative for redness and itching.  Respiratory: Negative for cough, chest tightness, shortness of breath and wheezing.   Cardiovascular: Negative for palpitations and leg swelling.  Gastrointestinal: Negative for nausea and vomiting.  Genitourinary: Negative for dysuria.  Musculoskeletal: Negative for joint swelling.  Skin: Negative for rash.  Neurological: Negative for headaches.  Hematological: Does not bruise/bleed easily.  Psychiatric/Behavioral: Negative for dysphoric mood. The patient is not nervous/anxious.        Objective:   Physical Exam Well-developed male in no acute distress. Nose without purulence or discharge noted Neck without lymphadenopathy or thyromegaly Chest totally clear to auscultation, no wheezing Cardiac exam with regular rate and rhythm Abdomen soft, nontender, bowel sounds present.  No significant CPA tenderness Lower extremities mild edema, no cyanosis Alert and oriented, moves all 4 extremities.       Assessment & Plan:

## 2013-09-13 NOTE — Patient Instructions (Signed)
Will refer you to the multidisciplinary thoracic oncology clinic on Thurs for treatment options. Will call you with the results of bloodwork.

## 2013-09-16 ENCOUNTER — Telehealth: Payer: Self-pay | Admitting: *Deleted

## 2013-09-16 NOTE — Telephone Encounter (Signed)
Called spoke with pt regarding appt for [facility] 09/19/13 at 2:45.  He verbalized understanding of time and place of appt

## 2013-09-17 ENCOUNTER — Telehealth: Payer: Self-pay | Admitting: Internal Medicine

## 2013-09-17 NOTE — Telephone Encounter (Signed)
C/D 09/17/13 for appt. 09/19/13 °

## 2013-09-19 ENCOUNTER — Encounter: Payer: Self-pay | Admitting: Radiation Oncology

## 2013-09-19 ENCOUNTER — Encounter: Payer: Self-pay | Admitting: Internal Medicine

## 2013-09-19 ENCOUNTER — Ambulatory Visit (HOSPITAL_BASED_OUTPATIENT_CLINIC_OR_DEPARTMENT_OTHER): Payer: Medicare Other | Admitting: Internal Medicine

## 2013-09-19 ENCOUNTER — Telehealth: Payer: Self-pay | Admitting: Internal Medicine

## 2013-09-19 ENCOUNTER — Ambulatory Visit
Admission: RE | Admit: 2013-09-19 | Discharge: 2013-09-19 | Disposition: A | Discharge status: Home / Self Care | Payer: Medicare Other | Source: Ambulatory Visit | Attending: Radiation Oncology | Admitting: Radiation Oncology

## 2013-09-19 DIAGNOSIS — Z801 Family history of malignant neoplasm of trachea, bronchus and lung: Secondary | ICD-10-CM

## 2013-09-19 DIAGNOSIS — C7970 Secondary malignant neoplasm of unspecified adrenal gland: Secondary | ICD-10-CM

## 2013-09-19 DIAGNOSIS — C3410 Malignant neoplasm of upper lobe, unspecified bronchus or lung: Secondary | ICD-10-CM | POA: Diagnosis not present

## 2013-09-19 DIAGNOSIS — R918 Other nonspecific abnormal finding of lung field: Secondary | ICD-10-CM

## 2013-09-19 NOTE — Progress Notes (Signed)
Radiation Oncology         [phone] ________________________________  Initial outpatient Consultation  Name: Anthony Curry MRN: [id number]  Date: 09/19/2013  DOB: [birth year]  FA:OZHYQ,MVHQIO ALEXANDER, MD  Clance, Maree Krabbe, MD , Si Gaul, MD, PhD  REFERRING PHYSICIAN: Shelle Iron Maree Krabbe, MD  DIAGNOSIS: Stage IV adenocarcinoma of the lung  HISTORY OF PRESENT ILLNESS::Anthony Curry is a 72 y.o. male who is seen out of the courtesy of Dr. Marcelyn Bruins for an opinion concerning radiation therapy as part of management of the patient's recently diagnosed what appears to be stage IV non-small cell lung cancer. Patient is seen as part of the multidisciplinary thoracic oncology clinic. Patient has a prior history of pulmonary nodules for which it was recommended periodic evaluation. More recently on imaging studies the nodules in the left upper lung area were increased in size raising suspicion of bronchogenic carcinoma. Patient was also noted to have multiple bilateral ground class nodules some of which were larger but none of which have developed solid components. The patient was referred to pulmonary medicine for evaluation. The patient was seen by Dr. Marcelyn Bruins and a PET scan was performed which showed activity in the 2 above mentioned pulmonary nodules.  In addition the left adrenal gland showed hypermetabolic activity. Patient proceeded to undergo biopsies of his left adrenal gland which revealed metastatic adenocarcinoma. A limited immunostain panel was performed which showed adenocarcinoma supportive of primary pulmonary adenocarcinoma. With this information the patient is now seen in [facility] for consultation.  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    PAST SURGICAL HISTORY: Past Surgical History  Procedure  Laterality Date  . Hemorrhoid surgery    . Ankle arthrodesis  1995    rt fx-hardware in  . Tonsillectomy    . Colonoscopy    . Excision / curettage bone cyst finger  2005    rt thumb  . Total shoulder arthroplasty  02/03/2012    Procedure: TOTAL SHOULDER ARTHROPLASTY;  Surgeon: Eulas Post, MD;  Location: [facility];  Service: Orthopedics;  Laterality: Left;    FAMILY HISTORY: family history includes Brain cancer in his brother; Diabetes in his paternal grandmother; Hypertension in his father; Lung cancer in his mother.  SOCIAL HISTORY:  reports that he quit smoking about 29 years ago. His smoking use included Cigarettes. He has a 60 pack-year smoking history. He does not have any smokeless tobacco history on file. He reports that he drinks alcohol. He reports that he does not use illicit drugs.  ALLERGIES: Review of patient's allergies indicates no known allergies.  MEDICATIONS:  Current Outpatient Prescriptions  Medication Sig Dispense Refill  . ALPRAZolam (XANAX) 0.25 MG tablet Take 1 tablet (0.25 mg total) by mouth 3 (three) times daily.  90 tablet  0  . amLODipine (NORVASC) 10 MG tablet Take 10 mg by mouth daily.       . Ascorbic Acid (VITAMIN C PO) Take 1 tablet by mouth daily.      [name] atenolol (TENORMIN) 100 MG tablet Take 100 mg by mouth daily.       . calcium carbonate (OS-CAL) 600 MG TABS Take 600 mg by mouth daily with breakfast.      . Cholecalciferol (VITAMIN D-3) 5000 UNITS TABS Take 5,000 Units by mouth daily.       [name] demeclocycline (DECLOMYCIN) 150 MG  tablet Take 150 mg by mouth 2 (two) times daily.      . feeding supplement (ENSURE COMPLETE) LIQD Take 237 mLs by mouth daily.  30 Bottle  0  . folic acid (FOLVITE) 1 MG tablet Take 1 tablet (1 mg total) by mouth daily.  30 tablet  6  . HYDROcodone-acetaminophen (NORCO/VICODIN) 5-325 MG per tablet Take 1-2 tablets by mouth every 4 (four) hours as needed.  30 tablet  0  . MAGNESIUM PO Take 1 tablet by mouth  daily.      . Multiple Vitamin (MULTIVITAMIN WITH MINERALS) TABS Take 1 tablet by mouth daily.      [name] olmesartan (BENICAR) 40 MG tablet Take 40 mg by mouth daily.       [name] omeprazole (PRILOSEC) 20 MG capsule Take 20 mg by mouth daily.       . Potassium 99 MG TABS Take 1 tablet by mouth daily.      . tamsulosin (FLOMAX) 0.4 MG CAPS capsule Take 0.4 mg by mouth 2 (two) times daily.      [name] thiamine (VITAMIN B-1) 100 MG tablet Take 1 tablet (100 mg total) by mouth daily.  30 tablet  6  . vitamin E 400 UNIT capsule Take 400 Units by mouth daily.       No current facility-administered medications for this encounter.    REVIEW OF SYSTEMS:  A 15 point review of systems is documented in the electronic medical record. This was obtained by the nursing staff. However, I reviewed this with the patient to discuss relevant findings and make appropriate changes.  He does complain of fatigue but has a good appetite. Patient does complain of the being unsteady with walking which is been noted by family members. Patient attributes this to excessive alcohol intake. He denies any headaches double vision or blurred vision. Patient denies any new bony pain. He denies any pain within the chest area cough or hemoptysis.    PHYSICAL EXAM:  General Appearance:    Alert, cooperative, no distress, appears stated age,  accompanied by his wife, son and daughter on evaluation today   Head:    Normocephalic, without obvious abnormality, atraumatic  Eyes:    PERRL, conjunctiva/corneas clear, EOM's intact, asymmetry is noted with the left eyelid drooping somewhat. This is been a chronic problem for the patient            Nose:   Nares normal, septum midline, mucosa normal, no drainage    or sinus tenderness  Throat:   Lips, mucosa, and tongue normal; dentures in place along the maxillary region gums normal  Neck:   Supple, symmetrical, trachea midline, no adenopathy;       thyroid:  No enlargement/tenderness/nodules; no  carotid   bruit or JVD  Back:     Symmetric, no curvature, ROM normal, no CVA tenderness  Lungs:     Clear to auscultation bilaterally, respirations unlabored  Chest wall:    No tenderness or deformity  Heart:    Regular rate and rhythm, S1 and S2 normal, no murmur, rub   or gallop  Abdomen:     Soft, non-tender, bowel sounds active all four quadrants,    no masses, no organomegaly        Extremities:   Extremities normal, atraumatic, no cyanosis, some swelling noted in the left ankle area from prior fracture   Pulses:   2+ and symmetric all extremities  Skin:   Skin color, texture, turgor normal, no rashes  or lesions  Lymph nodes:   Cervical, supraclavicular, and axillary nodes normal  Neurologic:    Normal strength except for some mild weakness in the proximal muscle groups of the right upper arm related to prior trauma      KPS = 90  100 - Normal; no complaints; no evidence of disease. 90   - Able to carry on normal activity; minor signs or symptoms of disease. 80   - Normal activity with effort; some signs or symptoms of disease. 60   - Cares for self; unable to carry on normal activity or to do active work. 60   - Requires occasional assistance, but is able to care for most of his personal needs. 50   - Requires considerable assistance and frequent medical care. 40   - Disabled; requires special care and assistance. 30   - Severely disabled; hospital admission is indicated although death not imminent. 20   - Very sick; hospital admission necessary; active supportive treatment necessary. 10   - Moribund; fatal processes progressing rapidly. 0     - Dead  Karnofsky DA, Abelmann WH, Craver LS and Burchenal [facility] [phone]) The use of the nitrogen mustards in the palliative treatment of carcinoma: with particular reference to bronchogenic carcinoma Cancer 1 [id number]  LABORATORY DATA:  Lab Results  Component Value Date   WBC 4.8 09/13/2013   HGB 11.1* 09/13/2013   HCT 31.8* 09/13/2013    MCV 102.0* 09/13/2013   PLT 276.0 09/13/2013   Lab Results  Component Value Date   NA 132* 09/13/2013   K 3.8 09/13/2013   CL 96 09/13/2013   CO2 29 09/13/2013   Lab Results  Component Value Date   ALT 16 07/05/2013   AST 26 07/05/2013   ALKPHOS 73 07/05/2013   BILITOT 1.0 07/05/2013     RADIOGRAPHY: Ct Abdomen Wo Contrast  09/09/2013   CLINICAL DATA:  Status post adrenal biopsy with left-sided back pain.  EXAM: CT ABDOMEN WITHOUT CONTRAST  TECHNIQUE: Multidetector CT imaging of the abdomen was performed following the standard protocol without IV contrast.  COMPARISON:  PET scan 08/27/2013.  FINDINGS: A heterogeneous retroperitoneal hematoma is now present within the left para renal space. This is displacing the pancreas anteriorly and. The left kidney is displaced anteriorly and laterally although there does not appear to be any significant mass effect on the kidney. Right kidney is unremarkable.  Mild dependent atelectasis is present at the lung bases bilaterally. The heart size is normal. No significant pleural or pericardial effusion is present. The liver is within normal limits. The spleen is displaced laterally by the hematoma. Layering gallstones are present in the gallbladder. No inflammatory changes are present. The pancreas is otherwise unremarkable. A right adrenal adenoma is stable.  Bone windows demonstrate marked and multilevel endplate degenerative changes. Numerous lytic lesions are present. These are predominate associate with the endplates and likely represent degenerative change.  IMPRESSION: 1. Diffuse soft tissue stranding compatible with hemorrhage in the left parirenal retroperitoneal space. 2. 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 3. Extensive atherosclerotic changes. 4. Displacement of the spleen. 5. Right adrenal adenoma.   Electronically Signed   By: Gennette Pac M.D.   On: 09/09/2013 13:35   Nm Pet Image Initial (pi) Skull Base To  Thigh  08/27/2013   CLINICAL DATA:  Initial treatment strategy for enlarging pulmonary nodules. History of tuberculosis.  EXAM: NUCLEAR MEDICINE PET SKULL BASE TO THIGH  FASTING BLOOD GLUCOSE:  Value:  85 mg/dl  TECHNIQUE: 16.1 mCi W-96 FDG was injected intravenously. CT data was obtained and used for attenuation correction and anatomic localization only. (This was not acquired as a diagnostic CT examination.) Additional exam technical data entered on technologist worksheet.  COMPARISON:  Chest CT 08/12/2013. Prior PET of 09/08/2010  FINDINGS: NECK  No areas of abnormal hypermetabolism.  CHEST  Hypermetabolism corresponding to the spiculated medial left apical lung nodule. This measures 1.7 x 0.7 cm and a S.U.V. max of 4.8 on image 56/ series 2.  More inferior left upper lobe pulmonary nodule which measures 1.2 x 1.2 cm and a S.U.V. max of 2.9 on image 75/ series 2.  The other areas of ground-glass opacity are not correlated with hypermetabolism.  ABDOMEN/PELVIS  Hypermetabolism corresponding to the enlarging left adrenal nodule. This measures 2.4 cm and a S.U.V. max of 13.0 on image 144/series 2.  Probable TURP defect.  SKELETON  Low-level hypermetabolism within the proximal right humerus corresponding to remote trauma. Grossly similar to on the prior PET.  CT IMAGES PERFORMED FOR ATTENUATION CORRECTION  Mild mucosal thickening of the right maxillary sinus. 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 Coronary artery atherosclerosis. Other chest findings deferred to recent diagnostic CT.  Gallstones.  Bladder dilatation and wall irregularity with small saccule 's.  Probable bone island in the left iliac at. Subtle lucency in the right iliac with no correlate hypermetabolism. Image 181/series 2. Either new or more prominent than on the 09/08/2010 EXAM. Nonacute bilateral rib trauma.  IMPRESSION: 1. Two hypermetabolic left upper lobe pulmonary nodules. Suspicious for primary bronchogenic carcinomas. 2.  Hypermetabolic left adrenal nodule. Favored to represent metastatic disease, possibly superimposed upon underlying adenoma. Consider tissue sampling of either the left adrenal lesion or the pulmonary nodules. Of note, the clinical history describes prior tuberculosis. The pulmonary and left adrenal findings could less likely be attributed to infection. 3. Other ground-glass pulmonary nodules which are not hypermetabolic but remain indeterminate. Recommend attention on follow-up. 4. Subtle lucency in the right iliac, without hypermetabolism. This is indeterminate but felt unlikely to represent osseous metastasis. 5. Incidental findings, including cholelithiasis, probable bladder outlet obstruction and Coronary artery disease.   Electronically Signed   By: Jeronimo Greaves   On: 08/27/2013 12:04   Ct Biopsy  09/09/2013   CLINICAL DATA:  Hypermetabolic left pulmonary nodules and left adrenal nodule.  EXAM: CT GUIDED CORE BIOPSY OF LEFT ADRENAL  ANESTHESIA/SEDATION: Intravenous Fentanyl and Versed were administered as conscious sedation during continuous cardiorespiratory monitoring by the radiology RN, with a total moderate sedation time of 15 minutes.  PROCEDURE: The procedure risks, benefits, and alternatives were explained to the patient. Questions regarding the procedure were encouraged and answered. The patient understands and consents to the procedure.  Patient placed in left lateral decubitus position. Limited scanning through the upper abdomen was performed. The left adrenal lesion was localized and an appropriate skin entry site determine. Operator donned sterile gloves and mask. Site was marked, prepped with Betadine, draped in usual sterile fashion, infiltrated locally with 1% lidocaine. Under CT fluoroscopic guidance, a 17 gauge trocar needle was advanced to the margin of the lesion. Once needle tip position was confirmed, coaxial 18-gauge core biopsy samples were obtained, submitted in formalin to surgical  pathology. The guide needle was removed. Postprocedure scans show minimal regional alveolar hemorrhage. The patient tolerated the procedure well.  Complications: None  IMPRESSION: Technically successful CT-guided core biopsy of left adrenal nodule.   Electronically Signed  By: Oley Balm M.D.   On: 09/09/2013 10:55   Dg Chest Port 1 View  09/09/2013   CLINICAL DATA:  Hypotension. Status post biopsy of left adrenal nodule. Evaluate for pneumothorax. COPD.  EXAM: PORTABLE CHEST - 1 VIEW  COMPARISON:  08/31/2010  FINDINGS: No evidence of pneumothorax or pleural effusion. Changes of COPD are again noted. Heart size and mediastinal contours are within normal limits. Old left-sided rib fractures are seen. Old fracture deformity of right humeral neck noted.  IMPRESSION: COPD. No evidence of pneumothorax or other acute findings.   Electronically Signed   By: Myles Rosenthal M.D.   On: 09/09/2013 16:41      IMPRESSION: Stage IV adenocarcinoma of the lung.   I reviewed the x-ray studies with the patient and his family today. We discussed palliative radiation therapy indications with stage IV lung cancer. At this time the patient does not appear to be symptomatic from his disease and with no immediate indications for radiation therapyjj.  PLAN: When necessary followup in radiation oncology. Patient will be meeting with medical oncology this afternoon for discussion of systemic chemotherapy and other potential modalities.  I spent 40 minutes minutes face to face with the patient and more than 50% of that time was spent in counseling and/or coordination of care.   ------------------------------------------------ -----------------------------------  Billie Lade, PhD, MD

## 2013-09-19 NOTE — Telephone Encounter (Signed)
gv and printed appt sched and avs for pt for NOV °

## 2013-09-19 NOTE — Progress Notes (Signed)
[facility] [phone]   [phone]  CONSULT NOTE  REFERRING PHYSICIAN: Dr. Marcelyn Bruins  REASON FOR CONSULTATION:  72 years old white male recently diagnosed with lung cancer.  HPI Anthony Curry is a 72 y.o. male was past medical history significant for hypertension, GERD, hyponatremia and long history of smoking but quit 22 years ago. The patient has a history of alcohol abuse and he was admitted to [facility] [place] on 07/03/2013 after fall from drinking alcohol. During his evaluation, CT scan of the head was performed and it was negative for bleed or acute intracranial process. CT scan of the cervical spine performed on the same day showed enlargement of left apical pulmonary nodule. This was followed by CT scan of the chest on 08/12/2013 and it showed A nodule in the apical left upper lobe measures approximately 8 x  15 mm (previously 3 x 6 mm on 10/15/2011. There is surrounding emphysema architectural distortion. In the adjacent inferomedial  aspect of the apical left upper lobe there is a focal bed of emphysema with a vague associated ground-glass, measuring 2.5 x 3.1  cm (previously 1.5 x 2.0 cm on 09/07/2010. No solid components. Focal area of nodularity in the anterior left upper lobe has associated calcification, measures approximately 11 x 15 mm (previously 8 x 11 mm). Multiple additional areas of nodular ground-glass are seen bilaterally, some of which appear increased in size. Index right lower lobe lesion measures 2.9 cm (previously approximate 2.5 cm). No solid components. A second index lesion in the posterior segment right upper lobe measures 2.0 Cm (previously 1.4 cm). Calcified granuloma in the left lower lobe. 3 mm right lower lobe nodule. There was also a 1.7 cm low attenuation lesion in the right adrenal  gland is consistent with an adenoma. A 2.4 cm low attenuation lesion in the left adrenal gland measures 23 HU and measures  slightly  larger than on prior examinations (previously 1.9 cm on 10/15/2011). A PET scan was performed on 08/28/2013 and it showed 2 hypermetabolic left upper lobe pulmonary nodules suspicious for primary bronchogenic carcinomas. There is hypermetabolic left adrenal nodule is favored to represent metastatic disease, possibly superimposed upon underlying adenoma. The other groundglass pulmonary nodules which are not hypermetabolic but remained indeterminate. There was also subtle lucency in the right iliac without hypermetabolism and is indeterminate but felt unlikely to represent osseous metastasis. On 09/09/2013 the patient underwent CT-guided core biopsy of the left adrenal nodule by interventional radiology. This procedure was complicated with hemorrhage in the left perirenal retroperitoneal space The final pathology (Accession: [id number]) showed metastatic adenocarcinoma. The adenocarcinoma demonstrates the following immunophenotype:TTF-1: strong diffuse expression, CDX2: negative expression. Given the recent CT scan demonstrating findings suspicious for primary bronchiogenic carcinoma, a limited immunostain panel was preformed. The presence of TTF-1 expression and absence of CDX2 expression in the adenocarcinoma is supportive of primary pulmonary adenocarcinoma.  Dr. Shelle Iron kindly referred the patient to me today for further evaluation and recommendation regarding treatment of his condition. Patient is feeling fine today with no specific complaints except for mild fatigue. He denied having any significant chest pain, shortness breath, cough or hemoptysis. He denied having any significant weight loss or night sweats. He has no headache or visual changes. His family history significant for a mother who was diagnosed with lung cancer at age 25, father died from heart attack and brother with brain tumor. The patient is married and has 3 children. He was accompanied today by his  wife Anthony Curry, his daughter Anthony Curry and his  son Anthony Curry. He also has a third son Anthony Curry. 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 He has a history of smoking 2 packs per day for around 3 years but quit 22 years ago. He also has a history of alcohol abuse and he is trying to cut down. No history of drug abuse.  HPI  Past Medical History  Diagnosis Date  . Hypertension   . GERD (gastroesophageal reflux disease)   . Alcohol abuse   . 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 Low sodium     Past Surgical History  Procedure Laterality Date  . Hemorrhoid surgery    . Ankle arthrodesis  1995    rt fx-hardware in  . Tonsillectomy    . Colonoscopy    . Excision / curettage bone cyst finger  2005    rt thumb  . Total shoulder arthroplasty  02/03/2012    Procedure: TOTAL SHOULDER ARTHROPLASTY;  Surgeon: Eulas Post, MD;  Location: [facility];  Service: Orthopedics;  Laterality: Left;    Family History  Problem Relation Age of Onset  . Brain cancer Brother   . Lung cancer Mother   . Hypertension Father   . Diabetes Paternal Grandmother     Social History History  Substance Use Topics  . Smoking status: Former Smoker -- 2.00 packs/day for 30 years    Types: Cigarettes    Quit date: 01/27/1984  . Smokeless tobacco: Not on file  . Alcohol Use: 0.0 oz/week    4-5 Cans of beer per week     Comment: admits to 4-6 beers daily    No Known Allergies  Current Outpatient Prescriptions  Medication Sig Dispense Refill  . ALPRAZolam (XANAX) 0.25 MG tablet Take 1 tablet (0.25 mg total) by mouth 3 (three) times daily.  90 tablet  0  . amLODipine (NORVASC) 10 MG tablet Take 10 mg by mouth daily.       . Ascorbic Acid (VITAMIN C PO) Take 1 tablet by mouth daily.      [name] atenolol (TENORMIN) 100 MG tablet Take 100 mg by mouth daily.       . calcium carbonate (OS-CAL) 600 MG TABS Take 600 mg by mouth daily with breakfast.      . Cholecalciferol (VITAMIN D-3) 5000 UNITS TABS Take 5,000 Units by mouth  daily.       [name] demeclocycline (DECLOMYCIN) 150 MG tablet Take 150 mg by mouth 2 (two) times daily.      . feeding supplement (ENSURE COMPLETE) LIQD Take 237 mLs by mouth daily.  30 Bottle  0  . folic acid (FOLVITE) 1 MG tablet Take 1 tablet (1 mg total) by mouth daily.  30 tablet  6  . HYDROcodone-acetaminophen (NORCO/VICODIN) 5-325 MG per tablet Take 1-2 tablets by mouth every 4 (four) hours as needed.  30 tablet  0  . MAGNESIUM PO Take 1 tablet by mouth daily.      . Multiple Vitamin (MULTIVITAMIN WITH MINERALS) TABS Take 1 tablet by mouth daily.      [name] olmesartan (BENICAR) 40 MG tablet Take 40 mg by mouth daily.       [name] omeprazole (PRILOSEC) 20 MG capsule Take 20 mg by mouth daily.       . Potassium 99 MG TABS Take 1 tablet by mouth daily.      . tamsulosin (FLOMAX) 0.4 MG CAPS capsule Take 0.4 mg by  mouth 2 (two) times daily.      [name] thiamine (VITAMIN B-1) 100 MG tablet Take 1 tablet (100 mg total) by mouth daily.  30 tablet  6  . vitamin E 400 UNIT capsule Take 400 Units by mouth daily.       No current facility-administered medications for this visit.    Review of Systems  Constitutional: positive for fatigue Eyes: negative Ears, nose, mouth, throat, and face: negative Respiratory: negative Cardiovascular: negative Gastrointestinal: negative Genitourinary:negative Integument/breast: negative Hematologic/lymphatic: negative Musculoskeletal:negative Neurological: negative Behavioral/Psych: negative Endocrine: negative Allergic/Immunologic: negative  Physical Exam  RUE:AVWUJ, healthy, no distress, well nourished and well developed SKIN: skin color, texture, turgor are normal HEAD: Normocephalic, No masses, lesions, tenderness or abnormalities EYES: normal, PERRLA EARS: External ears normal, Canals clear OROPHARYNX:no exudate, no erythema and lips, buccal mucosa, and tongue normal  NECK: supple, no adenopathy, no JVD LYMPH:  no palpable lymphadenopathy, no  hepatosplenomegaly LUNGS: clear to auscultation , and palpation HEART: regular rate & rhythm, no murmurs and no gallops ABDOMEN:abdomen soft, non-tender, normal bowel sounds and no masses or organomegaly BACK: Back symmetric, no curvature., No CVA tenderness EXTREMITIES:no joint deformities, effusion, or inflammation, no edema, no skin discoloration, no clubbing  NEURO: alert & oriented x 3 with fluent speech, no focal motor/sensory deficits  PERFORMANCE STATUS: ECOG 1  LABORATORY DATA: Lab Results  Component Value Date   WBC 4.8 09/13/2013   HGB 11.1* 09/13/2013   HCT 31.8* 09/13/2013   MCV 102.0* 09/13/2013   PLT 276.0 09/13/2013      Chemistry      Component Value Date/Time   NA 132* 09/13/2013 0816   K 3.8 09/13/2013 0816   CL 96 09/13/2013 0816   CO2 29 09/13/2013 0816   BUN 10 09/13/2013 0816   CREATININE 0.8 09/13/2013 0816      Component Value Date/Time   CALCIUM 9.4 09/13/2013 0816   ALKPHOS 73 07/05/2013 0441   AST 26 07/05/2013 0441   ALT 16 07/05/2013 0441   BILITOT 1.0 07/05/2013 0441       RADIOGRAPHIC STUDIES: Ct Abdomen Wo Contrast  2013-09-10   CLINICAL DATA:  Status post adrenal biopsy with left-sided back pain.  EXAM: CT ABDOMEN WITHOUT CONTRAST  TECHNIQUE: Multidetector CT imaging of the abdomen was performed following the standard protocol without IV contrast.  COMPARISON:  PET scan 08/27/2013.  FINDINGS: A heterogeneous retroperitoneal hematoma is now present within the left para renal space. This is displacing the pancreas anteriorly and. The left kidney is displaced anteriorly and laterally although there does not appear to be any significant mass effect on the kidney. Right kidney is unremarkable.  Mild dependent atelectasis is present at the lung bases bilaterally. The heart size is normal. No significant pleural or pericardial effusion is present. The liver is within normal limits. The spleen is displaced laterally by the hematoma. Layering gallstones are  present in the gallbladder. No inflammatory changes are present. The pancreas is otherwise unremarkable. A right adrenal adenoma is stable.  Bone windows demonstrate marked and multilevel endplate degenerative changes. Numerous lytic lesions are present. These are predominate associate with the endplates and likely represent degenerative change.  IMPRESSION: 1. Diffuse soft tissue stranding compatible with hemorrhage in the left parirenal retroperitoneal space. 2. 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 3. Extensive atherosclerotic changes. 4. Displacement of the spleen. 5. Right adrenal adenoma.   Electronically Signed   By: Gennette Pac M.D.   On: 09/10/13 13:35  Nm Pet Image Initial (pi) Skull Base To Thigh  08/27/2013   CLINICAL DATA:  Initial treatment strategy for enlarging pulmonary nodules. History of tuberculosis.  EXAM: NUCLEAR MEDICINE PET SKULL BASE TO THIGH  FASTING BLOOD GLUCOSE:  Value:  85 mg/dl  TECHNIQUE: 16.1 mCi W-96 FDG was injected intravenously. CT data was obtained and used for attenuation correction and anatomic localization only. (This was not acquired as a diagnostic CT examination.) Additional exam technical data entered on technologist worksheet.  COMPARISON:  Chest CT 08/12/2013. Prior PET of 09/08/2010  FINDINGS: NECK  No areas of abnormal hypermetabolism.  CHEST  Hypermetabolism corresponding to the spiculated medial left apical lung nodule. This measures 1.7 x 0.7 cm and a S.U.V. max of 4.8 on image 56/ series 2.  More inferior left upper lobe pulmonary nodule which measures 1.2 x 1.2 cm and a S.U.V. max of 2.9 on image 75/ series 2.  The other areas of ground-glass opacity are not correlated with hypermetabolism.  ABDOMEN/PELVIS  Hypermetabolism corresponding to the enlarging left adrenal nodule. This measures 2.4 cm and a S.U.V. max of 13.0 on image 144/series 2.  Probable TURP defect.  SKELETON  Low-level hypermetabolism within the proximal right  humerus corresponding to remote trauma. Grossly similar to on the prior PET.  CT IMAGES PERFORMED FOR ATTENUATION CORRECTION  Mild mucosal thickening of the right maxillary sinus. 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 Coronary artery atherosclerosis. Other chest findings deferred to recent diagnostic CT.  Gallstones.  Bladder dilatation and wall irregularity with small saccule 's.  Probable bone island in the left iliac at. Subtle lucency in the right iliac with no correlate hypermetabolism. Image 181/series 2. Either new or more prominent than on the 09/08/2010 EXAM. Nonacute bilateral rib trauma.  IMPRESSION: 1. Two hypermetabolic left upper lobe pulmonary nodules. Suspicious for primary bronchogenic carcinomas. 2. Hypermetabolic left adrenal nodule. Favored to represent metastatic disease, possibly superimposed upon underlying adenoma. Consider tissue sampling of either the left adrenal lesion or the pulmonary nodules. Of note, the clinical history describes prior tuberculosis. The pulmonary and left adrenal findings could less likely be attributed to infection. 3. Other ground-glass pulmonary nodules which are not hypermetabolic but remain indeterminate. Recommend attention on follow-up. 4. Subtle lucency in the right iliac, without hypermetabolism. This is indeterminate but felt unlikely to represent osseous metastasis. 5. Incidental findings, including cholelithiasis, probable bladder outlet obstruction and Coronary artery disease.   Electronically Signed   By: Jeronimo Greaves   On: 08/27/2013 12:04   Ct Biopsy  09/09/2013   CLINICAL DATA:  Hypermetabolic left pulmonary nodules and left adrenal nodule.  EXAM: CT GUIDED CORE BIOPSY OF LEFT ADRENAL  ANESTHESIA/SEDATION: Intravenous Fentanyl and Versed were administered as conscious sedation during continuous cardiorespiratory monitoring by the radiology RN, with a total moderate sedation time of 15 minutes.  PROCEDURE: The procedure risks,  benefits, and alternatives were explained to the patient. Questions regarding the procedure were encouraged and answered. The patient understands and consents to the procedure.  Patient placed in left lateral decubitus position. Limited scanning through the upper abdomen was performed. The left adrenal lesion was localized and an appropriate skin entry site determine. Operator donned sterile gloves and mask. Site was marked, prepped with Betadine, draped in usual sterile fashion, infiltrated locally with 1% lidocaine. Under CT fluoroscopic guidance, a 17 gauge trocar needle was advanced to the margin of the lesion. Once needle tip position was confirmed, coaxial 18-gauge core biopsy samples were obtained, submitted in formalin to  surgical pathology. The guide needle was removed. Postprocedure scans show minimal regional alveolar hemorrhage. The patient tolerated the procedure well.  Complications: None  IMPRESSION: Technically successful CT-guided core biopsy of left adrenal nodule.   Electronically Signed   By: Oley Balm M.D.   On: 09/09/2013 10:55   Dg Chest Port 1 View  09/09/2013   CLINICAL DATA:  Hypotension. Status post biopsy of left adrenal nodule. Evaluate for pneumothorax. COPD.  EXAM: PORTABLE CHEST - 1 VIEW  COMPARISON:  08/31/2010  FINDINGS: No evidence of pneumothorax or pleural effusion. Changes of COPD are again noted. Heart size and mediastinal contours are within normal limits. Old left-sided rib fractures are seen. Old fracture deformity of right humeral neck noted.  IMPRESSION: COPD. No evidence of pneumothorax or other acute findings.   Electronically Signed   By: Myles Rosenthal M.D.   On: 09/09/2013 16:41    ASSESSMENT: This is a very pleasant 72 years old white male with stage IV  (T1a, N0, M1b) non-small cell lung cancer, adenocarcinoma diagnosed in October of 2014, presented with multiple bilateral pulmonary nodules as well as bilateral adrenal metastasis.   PLAN: I have a lengthy  discussion with the patient and his family today about his current disease stage, prognosis and treatment options. I will send his tissue to be tested for Biomarkers by [facility].  I discussed with the patient his treatment options including systemic chemotherapy versus palliative care and hospice referral versus treatment with biologic targeted agent if he has actionable mutations. I gave the patient the option of starting chemotherapy now versus waiting until the biomarker testing is available. He is currently asymptomatic and he would like to wait until the final results become available. I would see him back for followup visit in this in 3 weeks for reevaluation and more detailed discussion of his treatment options. If he has no breast mutation, I would consider the patient for systemic chemotherapy with either carboplatin and Alimta or enrollment in the ECoG 5508 clinical trial with induction chemotherapy with carboplatin, paclitaxel and Avastin followed by maintenance treatment. I give the patient and his family the time to ask questions and I answered them completely to their satisfactions. He was advised to call immediately if he has any concerning symptoms in the interval. The patient voices understanding of current disease status and treatment options and is in agreement with the current care plan.  All questions were answered. The patient knows to call the clinic with any problems, questions or concerns. We can certainly see the patient much sooner if necessary.  Thank you so much for allowing me to participate in the care of Therisa Doyne. I will continue to follow up the patient with you and assist in his care.  I spent 40 minutes counseling the patient face to face. The total time spent in the appointment was 60 minutes.  Jermell Holeman K. 09/19/2013, 3:54 PM

## 2013-09-20 NOTE — Patient Instructions (Signed)
Followup visit in 3 weeks for evaluation and discussion of your treatment options based on the molecular study.

## 2013-09-23 ENCOUNTER — Encounter: Payer: Self-pay | Admitting: *Deleted

## 2013-09-23 NOTE — Progress Notes (Signed)
Sent application for gas card through [facility] [employer] access

## 2013-09-26 ENCOUNTER — Other Ambulatory Visit (HOSPITAL_COMMUNITY)
Admission: RE | Admit: 2013-09-26 | Discharge: 2013-09-26 | Disposition: A | Discharge status: Home / Self Care | Payer: Medicare Other | Source: Ambulatory Visit | Attending: Internal Medicine | Admitting: Internal Medicine

## 2013-09-26 DIAGNOSIS — C801 Malignant (primary) neoplasm, unspecified: Secondary | ICD-10-CM | POA: Insufficient documentation

## 2013-09-30 ENCOUNTER — Telehealth: Payer: Self-pay | Admitting: *Deleted

## 2013-09-30 NOTE — Telephone Encounter (Signed)
Called to check on pt. Left vm message

## 2013-10-08 ENCOUNTER — Encounter: Payer: Self-pay | Admitting: *Deleted

## 2013-10-08 NOTE — Progress Notes (Signed)
Looked at [facility] on line for test results.  They are pending at this time 10/08/13 at 4:20

## 2013-10-09 ENCOUNTER — Ambulatory Visit: Payer: Medicare Other | Admitting: Internal Medicine

## 2013-10-09 ENCOUNTER — Other Ambulatory Visit: Payer: Medicare Other | Admitting: Lab

## 2013-10-09 DIAGNOSIS — I10 Essential (primary) hypertension: Secondary | ICD-10-CM | POA: Diagnosis not present

## 2013-10-09 DIAGNOSIS — E871 Hypo-osmolality and hyponatremia: Secondary | ICD-10-CM | POA: Diagnosis not present

## 2013-10-09 DIAGNOSIS — F411 Generalized anxiety disorder: Secondary | ICD-10-CM | POA: Diagnosis not present

## 2013-10-09 DIAGNOSIS — D500 Iron deficiency anemia secondary to blood loss (chronic): Secondary | ICD-10-CM | POA: Diagnosis not present

## 2013-10-09 DIAGNOSIS — K219 Gastro-esophageal reflux disease without esophagitis: Secondary | ICD-10-CM | POA: Diagnosis not present

## 2013-10-15 ENCOUNTER — Telehealth: Payer: Self-pay | Admitting: *Deleted

## 2013-10-15 ENCOUNTER — Ambulatory Visit (HOSPITAL_BASED_OUTPATIENT_CLINIC_OR_DEPARTMENT_OTHER): Payer: Medicare Other | Admitting: Internal Medicine

## 2013-10-15 ENCOUNTER — Encounter: Payer: Self-pay | Admitting: Internal Medicine

## 2013-10-15 ENCOUNTER — Other Ambulatory Visit (HOSPITAL_BASED_OUTPATIENT_CLINIC_OR_DEPARTMENT_OTHER): Payer: Medicare Other | Admitting: Lab

## 2013-10-15 ENCOUNTER — Telehealth: Payer: Self-pay | Admitting: Internal Medicine

## 2013-10-15 DIAGNOSIS — C3490 Malignant neoplasm of unspecified part of unspecified bronchus or lung: Secondary | ICD-10-CM | POA: Diagnosis not present

## 2013-10-15 DIAGNOSIS — Z5111 Encounter for antineoplastic chemotherapy: Secondary | ICD-10-CM

## 2013-10-15 LAB — COMPREHENSIVE METABOLIC PANEL (CC13)
Albumin: 3.6 g/dL (ref 3.5–5.0)
Alkaline Phosphatase: 78 U/L (ref 40–150)
Anion Gap: 6 mEq/L (ref 3–11)
BUN: 15.3 mg/dL (ref 7.0–26.0)
CO2: 27 mEq/L (ref 22–29)
Glucose: 94 mg/dl (ref 70–140)
Potassium: 4.1 mEq/L (ref 3.5–5.1)
Sodium: 133 mEq/L — ABNORMAL LOW (ref 136–145)
Total Bilirubin: 0.44 mg/dL (ref 0.20–1.20)
Total Protein: 6.3 g/dL — ABNORMAL LOW (ref 6.4–8.3)

## 2013-10-15 LAB — CBC WITH DIFFERENTIAL/PLATELET
Basophils Absolute: 0 10*3/uL (ref 0.0–0.1)
Eosinophils Absolute: 0.2 10*3/uL (ref 0.0–0.5)
HGB: 12.9 g/dL — ABNORMAL LOW (ref 13.0–17.1)
LYMPH%: 33.1 % (ref 14.0–49.0)
MCHC: 34.1 g/dL (ref 32.0–36.0)
MCV: 98.2 fL — ABNORMAL HIGH (ref 79.3–98.0)
MONO#: 0.6 10*3/uL (ref 0.1–0.9)
MONO%: 12.6 % (ref 0.0–14.0)
NEUT#: 2.1 10*3/uL (ref 1.5–6.5)
Platelets: 216 10*3/uL (ref 140–400)
RBC: 3.86 10*6/uL — ABNORMAL LOW (ref 4.20–5.82)
WBC: 4.4 10*3/uL (ref 4.0–10.3)

## 2013-10-15 MED ORDER — CYANOCOBALAMIN 1000 MCG/ML IJ SOLN
INTRAMUSCULAR | Status: AC
  Filled 2013-10-15: qty 1

## 2013-10-15 MED ORDER — PROCHLORPERAZINE MALEATE 10 MG PO TABS: 10.0000 mg | ORAL_TABLET | Freq: Four times a day (QID) | ORAL | Status: DC | PRN

## 2013-10-15 MED ORDER — DEXAMETHASONE 4 MG PO TABS: ORAL_TABLET | ORAL | Status: DC

## 2013-10-15 MED ORDER — CYANOCOBALAMIN 1000 MCG/ML IJ SOLN
1000.0000 ug | Freq: Once | INTRAMUSCULAR | Status: AC
  Administered 2013-10-15: 1000 ug via INTRAMUSCULAR

## 2013-10-15 NOTE — Telephone Encounter (Signed)
Per staff message and POF I have scheduled appts.  JMW  

## 2013-10-15 NOTE — Progress Notes (Signed)
[facility] [phone]   Fax:([phone]) [phone]  OFFICE PROGRESS NOTE  Lolita Patella, MD [address] [place] [address]  DIAGNOSIS: Stage IV (T1 A., N0, M1 B.) non-small cell lung cancer, adenocarcinoma with negative EGFR mutation and negative ALK gene translocation diagnosed in October of 2014.  Molecular profile: Negative for RET, ALK, BRAF, MET, EGFR.                                 Positive for ERBB2 A775T, [id number], KRAS G13E, MAP2K1 D67N  (see full report).  PRIOR THERAPY: None  CURRENT THERAPY: Systemic chemotherapy with carboplatin for AUC of 5 and Alimta 500 mg/M2 every 3 weeks. First dose on 10/23/2013.  CHEMOTHERAPY INTENT: Palliative  CURRENT # OF CHEMOTHERAPY CYCLES: 1  CURRENT ANTIEMETICS: Zofran, Compazine and dexamethasone  CURRENT SMOKING STATUS: Former smoker  ORAL CHEMOTHERAPY AND CONSENT: None  CURRENT BISPHOSPHONATES USE: None  PAIN MANAGEMENT: 0/10  NARCOTICS INDUCED CONSTIPATION: None  LIVING WILL AND CODE STATUS: ?   INTERVAL HISTORY: Anthony Curry 72 y.o. male returns to the clinic today for followup visit accompanied by his wife and son. The patient is feeling fine today with no specific complaints. He denied having any significant chest pain, shortness breath, cough or hemoptysis. The patient denied having any weight loss or night sweats. He has no nausea or vomiting. He has no fever or chills. He was seen for initial evaluation on 09/19/2013 but we have been waiting for the final report of the foundation one the molecular study of his tumor. There was a delay in sending the tissue to be tested because of the 15 days rule for Medicare. The patient is here for evaluation and discussion of his treatment options based on the final molecular profile.  MEDICAL HISTORY: Past Medical History  Diagnosis Date  . Hypertension   . GERD (gastroesophageal reflux disease)   . Alcohol abuse   .  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 Low sodium     ALLERGIES:  has No Known Allergies.  MEDICATIONS:  Current Outpatient Prescriptions  Medication Sig Dispense Refill  . ALPRAZolam (XANAX) 0.25 MG tablet Take 1 tablet (0.25 mg total) by mouth 3 (three) times daily.  90 tablet  0  . amLODipine (NORVASC) 10 MG tablet Take 10 mg by mouth daily.       . Ascorbic Acid (VITAMIN C PO) Take 1 tablet by mouth daily.      [name] atenolol (TENORMIN) 100 MG tablet Take 100 mg by mouth daily.       . calcium carbonate (OS-CAL) 600 MG TABS Take 600 mg by mouth daily with breakfast.      . Cholecalciferol (VITAMIN D-3) 5000 UNITS TABS Take 5,000 Units by mouth daily.       [name] demeclocycline (DECLOMYCIN) 150 MG tablet Take 150 mg by mouth 2 (two) times daily.      . feeding supplement (ENSURE COMPLETE) LIQD Take 237 mLs by mouth daily.  30 Bottle  0  . folic acid (FOLVITE) 1 MG tablet Take 1 tablet (1 mg total) by mouth daily.  30 tablet  6  . HYDROcodone-acetaminophen (NORCO/VICODIN) 5-325 MG per tablet Take 1-2 tablets by mouth every 4 (four) hours as needed.  30 tablet  0  . MAGNESIUM PO Take 1 tablet by mouth daily.      . Multiple Vitamin (MULTIVITAMIN WITH  MINERALS) TABS Take 1 tablet by mouth daily.      [name] olmesartan (BENICAR) 40 MG tablet Take 40 mg by mouth daily.       [name] omeprazole (PRILOSEC) 20 MG capsule Take 20 mg by mouth daily.       . Potassium 99 MG TABS Take 1 tablet by mouth daily.      . tamsulosin (FLOMAX) 0.4 MG CAPS capsule Take 0.4 mg by mouth 2 (two) times daily.      [name] thiamine (VITAMIN B-1) 100 MG tablet Take 1 tablet (100 mg total) by mouth daily.  30 tablet  6  . vitamin E 400 UNIT capsule Take 400 Units by mouth daily.       No current facility-administered medications for this visit.    SURGICAL HISTORY:  Past Surgical History  Procedure Laterality Date  . Hemorrhoid surgery    . Ankle arthrodesis  1995    rt fx-hardware in  . Tonsillectomy    . Colonoscopy      . Excision / curettage bone cyst finger  2005    rt thumb  . Total shoulder arthroplasty  02/03/2012    Procedure: TOTAL SHOULDER ARTHROPLASTY;  Surgeon: Eulas Post, MD;  Location: [facility];  Service: Orthopedics;  Laterality: Left;    REVIEW OF SYSTEMS:  Constitutional: negative Eyes: negative Ears, nose, mouth, throat, and face: negative Respiratory: negative Cardiovascular: negative Gastrointestinal: negative Genitourinary:negative Integument/breast: negative Hematologic/lymphatic: negative Musculoskeletal:negative Neurological: negative Behavioral/Psych: negative Endocrine: negative Allergic/Immunologic: negative   PHYSICAL EXAMINATION: General appearance: alert, cooperative and no distress Head: Normocephalic, without obvious abnormality, atraumatic Neck: no adenopathy, no JVD, supple, symmetrical, trachea midline and thyroid not enlarged, symmetric, no tenderness/mass/nodules Lymph nodes: Cervical, supraclavicular, and axillary nodes normal. Resp: clear to auscultation bilaterally Back: symmetric, no curvature. ROM normal. No CVA tenderness. Cardio: regular rate and rhythm, S1, S2 normal, no murmur, click, rub or gallop GI: soft, non-tender; bowel sounds normal; no masses,  no organomegaly Extremities: extremities normal, atraumatic, no cyanosis or edema Neurologic: Alert and oriented X 3, normal strength and tone. Normal symmetric reflexes. Normal coordination and gait  ECOG PERFORMANCE STATUS: 1 - Symptomatic but completely ambulatory  Blood pressure 116/71, pulse 56, temperature 97.1 F (36.2 C), temperature source Oral, resp. rate 20, height 6' (1.829 m), weight 155 lb 3.2 oz (70.398 kg).  LABORATORY DATA: Lab Results  Component Value Date   WBC 4.4 10/15/2013   HGB 12.9* 10/15/2013   HCT 37.9* 10/15/2013   MCV 98.2* 10/15/2013   PLT 216 10/15/2013      Chemistry      Component Value Date/Time   NA 132* 09/13/2013 0816   K 3.8  09/13/2013 0816   CL 96 09/13/2013 0816   CO2 29 09/13/2013 0816   BUN 10 09/13/2013 0816   CREATININE 0.8 09/13/2013 0816      Component Value Date/Time   CALCIUM 9.4 09/13/2013 0816   ALKPHOS 73 07/05/2013 0441   AST 26 07/05/2013 0441   ALT 16 07/05/2013 0441   BILITOT 1.0 07/05/2013 0441       RADIOGRAPHIC STUDIES: No results found.  ASSESSMENT AND PLAN: This is a very pleasant 72 years old white male recently diagnosed with metastatic non-small cell lung cancer, adenocarcinoma with negative EGFR mutation and negative ALK gene translocation. I have a lengthy discussion with the patient and his family today about his current disease status and treatment options. I discussed the molecular profile of his tumor from foundation  1 with the patient and his family in details. I explained to the patient that because of the negative EGFR mutation and negative ALK gene translocation, just under treatment his systemic chemotherapy with either carboplatin, paclitaxel and Avastin versus carboplatin and Alimta. I discussed with the patient adverse effect of both regimens. The patient would like to proceed with treatment with carboplatin and Alimta. He'll be treated with carboplatin for AUC of 5 and Alimta 500 mg/M2 every 3 weeks. I discussed with the patient adverse effect of this treatment including but not limited to alopecia, myelosuppression, nausea and vomiting comparable neuropathy, liver or renal dysfunction. He is expected to start the first cycle of this treatment on 10/23/2013. The patient would have a chemotherapy education class before starting the first cycle of his treatment. He will receive vitamin B12 injection today. I will call his pharmacy with prescription for Compazine 10 mg by mouth every 6 hours as needed for nausea in addition to Decadron 4 mg by mouth twice a day the day before, day of and day after the chemotherapy. The patient will continue his current treatment with folic acid 1  mg by mouth daily. He would come back for followup visit in 2 weeks for reevaluation and management any adverse effect of his chemotherapy. The patient was advised to call immediately if he has any concerning symptoms in the interval. The patient voices understanding of current disease status and treatment options and is in agreement with the current care plan.  All questions were answered. The patient knows to call the clinic with any problems, questions or concerns. We can certainly see the patient much sooner if necessary.  I spent 20 minutes counseling the patient face to face. The total time spent in the appointment was 30 minutes.

## 2013-10-15 NOTE — Patient Instructions (Signed)
We discussed treatment options today including systemic chemotherapy with carboplatin and Alimta. First cycle of chemotherapy on 10/23/2013 in followup visit on 10/30/2013.

## 2013-10-15 NOTE — Telephone Encounter (Signed)
PER 11/11 POF SCHEDULED APPTS AVS AND CAL GIVEN TO PT EMAIL TO mw TO PUT ON [place] [facility]

## 2013-10-21 ENCOUNTER — Other Ambulatory Visit: Payer: Medicare Other

## 2013-10-22 ENCOUNTER — Encounter: Payer: Self-pay | Admitting: *Deleted

## 2013-10-23 ENCOUNTER — Ambulatory Visit: Payer: Medicare Other

## 2013-10-23 ENCOUNTER — Ambulatory Visit (HOSPITAL_BASED_OUTPATIENT_CLINIC_OR_DEPARTMENT_OTHER): Payer: Medicare Other | Admitting: Physician Assistant

## 2013-10-23 ENCOUNTER — Encounter: Payer: Self-pay | Admitting: Physician Assistant

## 2013-10-23 ENCOUNTER — Other Ambulatory Visit: Payer: Medicare Other | Admitting: Lab

## 2013-10-23 ENCOUNTER — Telehealth: Payer: Self-pay | Admitting: *Deleted

## 2013-10-23 ENCOUNTER — Other Ambulatory Visit (HOSPITAL_BASED_OUTPATIENT_CLINIC_OR_DEPARTMENT_OTHER): Payer: Medicare Other | Admitting: Lab

## 2013-10-23 ENCOUNTER — Encounter (HOSPITAL_COMMUNITY): Payer: Self-pay

## 2013-10-23 DIAGNOSIS — C3490 Malignant neoplasm of unspecified part of unspecified bronchus or lung: Secondary | ICD-10-CM | POA: Diagnosis not present

## 2013-10-23 DIAGNOSIS — C3410 Malignant neoplasm of upper lobe, unspecified bronchus or lung: Secondary | ICD-10-CM

## 2013-10-23 DIAGNOSIS — C7970 Secondary malignant neoplasm of unspecified adrenal gland: Secondary | ICD-10-CM | POA: Diagnosis not present

## 2013-10-23 LAB — CBC WITH DIFFERENTIAL/PLATELET
BASO%: 0.1 % (ref 0.0–2.0)
Basophils Absolute: 0 10*3/uL (ref 0.0–0.1)
EOS%: 0.1 % (ref 0.0–7.0)
HCT: 36.5 % — ABNORMAL LOW (ref 38.4–49.9)
HGB: 13.2 g/dL (ref 13.0–17.1)
LYMPH%: 9.6 % — ABNORMAL LOW (ref 14.0–49.0)
MCH: 33.2 pg (ref 27.2–33.4)
MCHC: 36.2 g/dL — ABNORMAL HIGH (ref 32.0–36.0)
MCV: 91.7 fL (ref 79.3–98.0)
MONO%: 3.3 % (ref 0.0–14.0)
NEUT%: 86.9 % — ABNORMAL HIGH (ref 39.0–75.0)
Platelets: 258 10*3/uL (ref 140–400)
RDW: 13.4 % (ref 11.0–14.6)

## 2013-10-23 LAB — COMPREHENSIVE METABOLIC PANEL (CC13)
ALT: 11 U/L (ref 0–55)
AST: 17 U/L (ref 5–34)
Alkaline Phosphatase: 75 U/L (ref 40–150)
Anion Gap: 11 mEq/L (ref 3–11)
BUN: 15.8 mg/dL (ref 7.0–26.0)
Calcium: 9.8 mg/dL (ref 8.4–10.4)
Chloride: 99 mEq/L (ref 98–109)
Creatinine: 0.8 mg/dL (ref 0.7–1.3)
Potassium: 4 mEq/L (ref 3.5–5.1)
Total Bilirubin: 0.66 mg/dL (ref 0.20–1.20)
Total Protein: 6.9 g/dL (ref 6.4–8.3)

## 2013-10-23 MED ORDER — SODIUM CHLORIDE 0.9 % IV SOLN
Freq: Once | INTRAVENOUS | Status: AC
  Administered 2013-10-23: 13:00:00 via INTRAVENOUS

## 2013-10-23 MED ORDER — DEXAMETHASONE SODIUM PHOSPHATE 20 MG/5ML IJ SOLN
20.0000 mg | Freq: Once | INTRAMUSCULAR | Status: AC
  Administered 2013-10-23: 20 mg via INTRAVENOUS

## 2013-10-23 MED ORDER — SODIUM CHLORIDE 0.9 % IV SOLN
447.5000 mg | Freq: Once | INTRAVENOUS | Status: AC
  Administered 2013-10-23: 450 mg via INTRAVENOUS
  Filled 2013-10-23: qty 45

## 2013-10-23 MED ORDER — SODIUM CHLORIDE 0.9 % IV SOLN
500.0000 mg/m2 | Freq: Once | INTRAVENOUS | Status: AC
  Administered 2013-10-23: 950 mg via INTRAVENOUS
  Filled 2013-10-23: qty 38

## 2013-10-23 MED ORDER — ONDANSETRON 16 MG/50ML IVPB (CHCC)
16.0000 mg | Freq: Once | INTRAVENOUS | Status: AC
  Administered 2013-10-23: 16 mg via INTRAVENOUS

## 2013-10-23 MED ORDER — DEXAMETHASONE SODIUM PHOSPHATE 20 MG/5ML IJ SOLN
INTRAMUSCULAR | Status: AC
  Filled 2013-10-23: qty 5

## 2013-10-23 MED ORDER — ONDANSETRON 16 MG/50ML IVPB (CHCC)
INTRAVENOUS | Status: AC
  Filled 2013-10-23: qty 16

## 2013-10-23 NOTE — Telephone Encounter (Signed)
Per staff message and POF I have scheduled appts.  JMW  

## 2013-10-23 NOTE — Progress Notes (Addendum)
[facility] [phone]   Fax:(336) [phone]  OFFICE PROGRESS NOTE  Lolita Patella, MD [address], [address] [place] [address]  DIAGNOSIS: Stage IV (T1 A., N0, M1 B.) non-small cell lung cancer, adenocarcinoma with negative EGFR mutation and negative ALK gene translocation diagnosed in October of 2014.  Molecular profile: Negative for RET, ALK, BRAF, MET, EGFR.                                 Positive for ERBB2 A775T, [id number], KRAS G13E, MAP2K1 D67N  (see full report).  PRIOR THERAPY: None  CURRENT THERAPY: Systemic chemotherapy with carboplatin for AUC of 5 and Alimta 500 mg/M2 every 3 weeks. First dose on 10/23/2013.  CHEMOTHERAPY INTENT: Palliative  CURRENT # OF CHEMOTHERAPY CYCLES: 1  CURRENT ANTIEMETICS: Zofran, Compazine and dexamethasone  CURRENT SMOKING STATUS: Former smoker  ORAL CHEMOTHERAPY AND CONSENT: None  CURRENT BISPHOSPHONATES USE: None  PAIN MANAGEMENT: 0/10  NARCOTICS INDUCED CONSTIPATION: None  LIVING WILL AND CODE STATUS: ?   INTERVAL HISTORY: Anthony Curry 72 y.o. male returns to the clinic today for followup visit accompanied by his wife. The patient is feeling fine today with no specific complaints. He denied having any significant chest pain, shortness breath, cough or hemoptysis. The patient denied having any weight loss or night sweats. He has no nausea or vomiting. He has no fever or chills. He presents to proceed with his first cycle of chemotherapy.   MEDICAL HISTORY: Past Medical History  Diagnosis Date  . Hypertension   . GERD (gastroesophageal reflux disease)   . Alcohol abuse   . 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 Low sodium     ALLERGIES:  has No Known Allergies.  MEDICATIONS:  Current Outpatient Prescriptions  Medication Sig Dispense Refill  . ALPRAZolam (XANAX) 0.25 MG tablet Take 1 tablet (0.25 mg total) by mouth 3 (three) times daily.  90 tablet  0  .  amLODipine (NORVASC) 10 MG tablet Take 10 mg by mouth daily.       . Ascorbic Acid (VITAMIN C PO) Take 1 tablet by mouth daily.      [name] atenolol (TENORMIN) 100 MG tablet Take 100 mg by mouth daily.       . calcium carbonate (OS-CAL) 600 MG TABS Take 600 mg by mouth daily with breakfast.      . Cholecalciferol (VITAMIN D-3) 5000 UNITS TABS Take 5,000 Units by mouth daily.       [name] demeclocycline (DECLOMYCIN) 150 MG tablet Take 150 mg by mouth 2 (two) times daily.      [name] dexamethasone (DECADRON) 4 MG tablet 4 milligrams by mouth twice a day the day before, day of and day after the chemotherapy every 3 weeks.  40 tablet  1  . feeding supplement (ENSURE COMPLETE) LIQD Take 237 mLs by mouth daily.  30 Bottle  0  . folic acid (FOLVITE) 1 MG tablet Take 1 tablet (1 mg total) by mouth daily.  30 tablet  6  . HYDROcodone-acetaminophen (NORCO/VICODIN) 5-325 MG per tablet Take 1-2 tablets by mouth every 4 (four) hours as needed.  30 tablet  0  . MAGNESIUM PO Take 1 tablet by mouth daily.      . Multiple Vitamin (MULTIVITAMIN WITH MINERALS) TABS Take 1 tablet by mouth daily.      [name] olmesartan (BENICAR) 40 MG tablet Take 40 mg by  mouth daily.       [name] omeprazole (PRILOSEC) 20 MG capsule Take 20 mg by mouth daily.       . Potassium 99 MG TABS Take 1 tablet by mouth daily.      . tamsulosin (FLOMAX) 0.4 MG CAPS capsule Take 0.4 mg by mouth 2 (two) times daily.      [name] thiamine (VITAMIN B-1) 100 MG tablet Take 1 tablet (100 mg total) by mouth daily.  30 tablet  6  . vitamin E 400 UNIT capsule Take 400 Units by mouth daily.      . prochlorperazine (COMPAZINE) 10 MG tablet Take 1 tablet (10 mg total) by mouth every 6 (six) hours as needed for nausea or vomiting.  60 tablet  0   No current facility-administered medications for this visit.    SURGICAL HISTORY:  Past Surgical History  Procedure Laterality Date  . Hemorrhoid surgery    . Ankle arthrodesis  1995    rt fx-hardware in  . Tonsillectomy    .  Colonoscopy    . Excision / curettage bone cyst finger  2005    rt thumb  . Total shoulder arthroplasty  02/03/2012    Procedure: TOTAL SHOULDER ARTHROPLASTY;  Surgeon: Eulas Post, MD;  Location: [facility];  Service: Orthopedics;  Laterality: Left;    REVIEW OF SYSTEMS:  Constitutional: negative Eyes: negative Ears, nose, mouth, throat, and face: negative Respiratory: negative Cardiovascular: negative Gastrointestinal: negative Genitourinary:negative Integument/breast: negative Hematologic/lymphatic: negative Musculoskeletal:negative Neurological: negative Behavioral/Psych: negative Endocrine: negative Allergic/Immunologic: negative   PHYSICAL EXAMINATION: General appearance: alert, cooperative and no distress Head: Normocephalic, without obvious abnormality, atraumatic Neck: no adenopathy, no JVD, supple, symmetrical, trachea midline and thyroid not enlarged, symmetric, no tenderness/mass/nodules Lymph nodes: Cervical, supraclavicular, and axillary nodes normal. Resp: clear to auscultation bilaterally Back: symmetric, no curvature. ROM normal. No CVA tenderness. Cardio: regular rate and rhythm, S1, S2 normal, no murmur, click, rub or gallop GI: soft, non-tender; bowel sounds normal; no masses,  no organomegaly Extremities: extremities normal, atraumatic, no cyanosis or edema Neurologic: Alert and oriented X 3, normal strength and tone. Normal symmetric reflexes. Normal coordination and gait  ECOG PERFORMANCE STATUS: 1 - Symptomatic but completely ambulatory  Blood pressure 128/64, pulse 67, temperature 97.8 F (36.6 C), temperature source Oral, resp. rate 18, height 6' (1.829 m), weight 158 lb 14.4 oz (72.077 kg).  LABORATORY DATA: Lab Results  Component Value Date   WBC 9.3 10/23/2013   HGB 13.2 10/23/2013   HCT 36.5* 10/23/2013   MCV 91.7 10/23/2013   PLT 258 10/23/2013      Chemistry      Component Value Date/Time   NA 132* 10/23/2013 1104    NA 132* 09/13/2013 0816   K 4.0 10/23/2013 1104   K 3.8 09/13/2013 0816   CL 96 09/13/2013 0816   CO2 23 10/23/2013 1104   CO2 29 09/13/2013 0816   BUN 15.8 10/23/2013 1104   BUN 10 09/13/2013 0816   CREATININE 0.8 10/23/2013 1104   CREATININE 0.8 09/13/2013 0816      Component Value Date/Time   CALCIUM 9.8 10/23/2013 1104   CALCIUM 9.4 09/13/2013 0816   ALKPHOS 75 10/23/2013 1104   ALKPHOS 73 07/05/2013 0441   AST 17 10/23/2013 1104   AST 26 07/05/2013 0441   ALT 11 10/23/2013 1104   ALT 16 07/05/2013 0441   BILITOT 0.66 10/23/2013 1104   BILITOT 1.0 07/05/2013 0441       RADIOGRAPHIC  STUDIES: No results found.  ASSESSMENT AND PLAN: This is a very pleasant 72 years old white male recently diagnosed with metastatic non-small cell lung cancer, adenocarcinoma with negative EGFR mutation and negative ALK gene translocation. Patient was discussed with and also seen by Dr. Arbutus Ped. He will proceed with his first cycle of systemic chemotherapy with carboplatin for an AUC of 5 and Alimta 500 mg per meter squared given every 3 weeks today as scheduled. He'll followup in one week for a symptom management visit. He will continue with weekly labs consisting of a CBC differential and C. met The patient will continue his current treatment with folic acid 1 mg by mouth daily.  Laural Benes, Nero Sawatzky E, PA-C   The patient was advised to call immediately if he has any concerning symptoms in the interval. The patient voices understanding of current disease status and treatment options and is in agreement with the current care plan.  All questions were answered. The patient knows to call the clinic with any problems, questions or concerns. We can certainly see the patient much sooner if necessary.  ADDENDUM: Hematology/Oncology attending: I had face to face encounter with the patient. I recommended his care plan. This is a very pleasant 72 years old white male recently diagnosed with metastatic NSCLC,  Adenocarcinoma. He has no EGFR mutation or ALK gene translocation. We will proceed with systemic chemotherapy with Carboplatin and Alimta today. He was reminded with the adverse effects.  He will come back for follow up visit in one week for reevaluation. He was advised to call immediately if he has any concerning symptoms in the interval. Disclaimer: This note was dictated with voice recognition software. Similar sounding words can inadvertently be transcribed and may not be corrected upon review. Lajuana Matte., MD 01/04/2014

## 2013-10-23 NOTE — Patient Instructions (Signed)
Follow up in 1 week for a symptom management visit

## 2013-10-23 NOTE — Patient Instructions (Signed)
[facility] Discharge Instructions for Patients Receiving Chemotherapy  Today you received the following chemotherapy agents: alimta, carboplatin  To help prevent nausea and vomiting after your treatment, we encourage you to take your nausea medication.  Take it as often as prescribed.     If you develop nausea and vomiting that is not controlled by your nausea medication, call the clinic. If it is after clinic hours your family physician or the after hours number for the clinic or go to the Emergency Department.   BELOW ARE SYMPTOMS THAT SHOULD BE REPORTED IMMEDIATELY:  *FEVER GREATER THAN 100.5 F  *CHILLS WITH OR WITHOUT FEVER  NAUSEA AND VOMITING THAT IS NOT CONTROLLED WITH YOUR NAUSEA MEDICATION  *UNUSUAL SHORTNESS OF BREATH  *UNUSUAL BRUISING OR BLEEDING  TENDERNESS IN MOUTH AND THROAT WITH OR WITHOUT PRESENCE OF ULCERS  *URINARY PROBLEMS  *BOWEL PROBLEMS  UNUSUAL RASH Items with * indicate a potential emergency and should be followed up as soon as possible.  Feel free to call the clinic you have any questions or concerns. The clinic phone number is [phone].   I have been informed and understand all the instructions given to me. I know to contact the clinic, my physician, or go to the Emergency Department if any problems should occur. I do not have any questions at this time, but understand that I may call the clinic during office hours   should I have any questions or need assistance in obtaining follow up care.    __________________________________________  _____________  __________ Signature of Patient or Authorized Representative            Date                   Time    __________________________________________ Nurse's Signature    Pemetrexed injection (Alimta)  What is this medicine? PEMETREXED (PEM e TREX ed) is a chemotherapy drug. This medicine affects cells that are rapidly growing, such as cancer cells and cells in your mouth and  stomach. It is usually used to treat lung cancers like non-small cell lung cancer and mesothelioma. It may also be used to treat other cancers. This medicine may be used for other purposes; ask your health care provider or pharmacist if you have questions. COMMON BRAND NAME(S): Alimta What should I tell my health care provider before I take this medicine? They need to know if you have any of these conditions: -if you frequently drink alcohol containing beverages -infection (especially a virus infection such as chickenpox, cold sores, or herpes) -kidney disease -liver disease -low blood counts, like low platelets, red bloods, or white blood cells -an unusual or allergic reaction to pemetrexed, mannitol, other medicines, foods, dyes, or preservatives -pregnant or trying to get pregnant -breast-feeding How should I use this medicine? This drug is given as an infusion into a vein. It is administered in a hospital or clinic by a specially trained health care professional. Talk to your pediatrician regarding the use of this medicine in children. Special care may be needed. Overdosage: If you think you have taken too much of this medicine contact a poison control center or emergency room at once. NOTE: This medicine is only for you. Do not share this medicine with others. What if I miss a dose? It is important not to miss your dose. Call your doctor or health care professional if you are unable to keep an appointment. What may interact with this medicine? -aspirin and aspirin-like medicines -medicines  to increase blood counts like filgrastim, pegfilgrastim, sargramostim -methotrexate -NSAIDS, medicines for pain and inflammation, like ibuprofen or naproxen -probenecid -pyrimethamine -vaccines Talk to your doctor or health care professional before taking any of these medicines: -acetaminophen -aspirin -ibuprofen -ketoprofen -naproxen This list may not describe all possible interactions. Give  your health care provider a list of all the medicines, herbs, non-prescription drugs, or dietary supplements you use. Also tell them if you smoke, drink alcohol, or use illegal drugs. Some items may interact with your medicine. What should I watch for while using this medicine? Visit your doctor for checks on your progress. This drug may make you feel generally unwell. This is not uncommon, as chemotherapy can affect healthy cells as well as cancer cells. Report any side effects. Continue your course of treatment even though you feel ill unless your doctor tells you to stop. In some cases, you may be given additional medicines to help with side effects. Follow all directions for their use. Call your doctor or health care professional for advice if you get a fever, chills or sore throat, or other symptoms of a cold or flu. Do not treat yourself. This drug decreases your body's ability to fight infections. Try to avoid being around people who are sick. This medicine may increase your risk to bruise or bleed. Call your doctor or health care professional if you notice any unusual bleeding. Be careful brushing and flossing your teeth or using a toothpick because you may get an infection or bleed more easily. If you have any dental work done, tell your dentist you are receiving this medicine. Avoid taking products that contain aspirin, acetaminophen, ibuprofen, naproxen, or ketoprofen unless instructed by your doctor. These medicines may hide a fever. Call your doctor or health care professional if you get diarrhea or mouth sores. Do not treat yourself. To protect your kidneys, drink water or other fluids as directed while you are taking this medicine. Men and women must use effective birth control while taking this medicine. You may also need to continue using effective birth control for a time after stopping this medicine. Do not become pregnant while taking this medicine. Tell your doctor right away if you  think that you or your partner might be pregnant. There is a potential for serious side effects to an unborn child. Talk to your health care professional or pharmacist for more information. Do not breast-feed an infant while taking this medicine. This medicine may lower sperm counts. What side effects may I notice from receiving this medicine? Side effects that you should report to your doctor or health care professional as soon as possible: -allergic reactions like skin rash, itching or hives, swelling of the face, lips, or tongue -low blood counts - this medicine may decrease the number of white blood cells, red blood cells and platelets. You may be at increased risk for infections and bleeding. -signs of infection - fever or chills, cough, sore throat, pain or difficulty passing urine -signs of decreased platelets or bleeding - bruising, pinpoint red spots on the skin, black, tarry stools, blood in the urine -signs of decreased red blood cells - unusually weak or tired, fainting spells, lightheadedness -breathing problems, like a dry cough -changes in emotions or moods -chest pain -confusion -diarrhea -high blood pressure -mouth or throat sores or ulcers -pain, swelling, warmth in the leg -pain on swallowing -swelling of the ankles, feet, hands -trouble passing urine or change in the amount of urine -vomiting -yellowing of the eyes  or skin Side effects that usually do not require medical attention (report to your doctor or health care professional if they continue or are bothersome): -hair loss -loss of appetite -nausea -stomach upset This list may not describe all possible side effects. Call your doctor for medical advice about side effects. You may report side effects to FDA at 1-800-FDA-1088. Where should I keep my medicine? This drug is given in a hospital or clinic and will not be stored at home. NOTE: This sheet is a summary. It may not cover all possible information. If you  have questions about this medicine, talk to your doctor, pharmacist, or health care provider.  2014, Elsevier/Gold Standard. (2008-06-24 13:24:03)  Carboplatin injection What is this medicine? CARBOPLATIN (KAR boe pla tin) is a chemotherapy drug. It targets fast dividing cells, like cancer cells, and causes these cells to die. This medicine is used to treat ovarian cancer and many other cancers. This medicine may be used for other purposes; ask your health care provider or pharmacist if you have questions. COMMON BRAND NAME(S): Paraplatin What should I tell my health care provider before I take this medicine? They need to know if you have any of these conditions: -blood disorders -hearing problems -kidney disease -recent or ongoing radiation therapy -an unusual or allergic reaction to carboplatin, cisplatin, other chemotherapy, other medicines, foods, dyes, or preservatives -pregnant or trying to get pregnant -breast-feeding How should I use this medicine? This drug is usually given as an infusion into a vein. It is administered in a hospital or clinic by a specially trained health care professional. Talk to your pediatrician regarding the use of this medicine in children. Special care may be needed. Overdosage: If you think you have taken too much of this medicine contact a poison control center or emergency room at once. NOTE: This medicine is only for you. Do not share this medicine with others. What if I miss a dose? It is important not to miss a dose. Call your doctor or health care professional if you are unable to keep an appointment. What may interact with this medicine? -medicines for seizures -medicines to increase blood counts like filgrastim, pegfilgrastim, sargramostim -some antibiotics like amikacin, gentamicin, neomycin, streptomycin, tobramycin -vaccines Talk to your doctor or health care professional before taking any of these  medicines: -acetaminophen -aspirin -ibuprofen -ketoprofen -naproxen This list may not describe all possible interactions. Give your health care provider a list of all the medicines, herbs, non-prescription drugs, or dietary supplements you use. Also tell them if you smoke, drink alcohol, or use illegal drugs. Some items may interact with your medicine. What should I watch for while using this medicine? Your condition will be monitored carefully while you are receiving this medicine. You will need important blood work done while you are taking this medicine. This drug may make you feel generally unwell. This is not uncommon, as chemotherapy can affect healthy cells as well as cancer cells. Report any side effects. Continue your course of treatment even though you feel ill unless your doctor tells you to stop. In some cases, you may be given additional medicines to help with side effects. Follow all directions for their use. Call your doctor or health care professional for advice if you get a fever, chills or sore throat, or other symptoms of a cold or flu. Do not treat yourself. This drug decreases your body's ability to fight infections. Try to avoid being around people who are sick. This medicine may increase your risk  to bruise or bleed. Call your doctor or health care professional if you notice any unusual bleeding. Be careful brushing and flossing your teeth or using a toothpick because you may get an infection or bleed more easily. If you have any dental work done, tell your dentist you are receiving this medicine. Avoid taking products that contain aspirin, acetaminophen, ibuprofen, naproxen, or ketoprofen unless instructed by your doctor. These medicines may hide a fever. Do not become pregnant while taking this medicine. Women should inform their doctor if they wish to become pregnant or think they might be pregnant. There is a potential for serious side effects to an unborn child. Talk to  your health care professional or pharmacist for more information. Do not breast-feed an infant while taking this medicine. What side effects may I notice from receiving this medicine? Side effects that you should report to your doctor or health care professional as soon as possible: -allergic reactions like skin rash, itching or hives, swelling of the face, lips, or tongue -signs of infection - fever or chills, cough, sore throat, pain or difficulty passing urine -signs of decreased platelets or bleeding - bruising, pinpoint red spots on the skin, black, tarry stools, nosebleeds -signs of decreased red blood cells - unusually weak or tired, fainting spells, lightheadedness -breathing problems -changes in hearing -changes in vision -chest pain -high blood pressure -low blood counts - This drug may decrease the number of white blood cells, red blood cells and platelets. You may be at increased risk for infections and bleeding. -nausea and vomiting -pain, swelling, redness or irritation at the injection site -pain, tingling, numbness in the hands or feet -problems with balance, talking, walking -trouble passing urine or change in the amount of urine Side effects that usually do not require medical attention (report to your doctor or health care professional if they continue or are bothersome): -hair loss -loss of appetite -metallic taste in the mouth or changes in taste This list may not describe all possible side effects. Call your doctor for medical advice about side effects. You may report side effects to FDA at 1-800-FDA-1088. Where should I keep my medicine? This drug is given in a hospital or clinic and will not be stored at home. NOTE: This sheet is a summary. It may not cover all possible information. If you have questions about this medicine, talk to your doctor, pharmacist, or health care provider.  2014, Elsevier/Gold Standard. (2008-02-26 14:38:05)

## 2013-10-24 ENCOUNTER — Telehealth: Payer: Self-pay | Admitting: Internal Medicine

## 2013-10-24 NOTE — Telephone Encounter (Signed)
Called Anthony Curry for chemotherapy F/U.  Patient is doing well.  Denies n/v.  Denies any new side effects or symptoms.  Bowel and bladder is functioning well.  Eating and drinking well and I instructed to drink 64 oz minimum daily or at least the day before, of and after treatment.  Only complaint is feeling sleepy but says he slept well.  Denies questions at this time and encouraged to call if needed.  Reviewed how to call after hours in the case of an emergency.

## 2013-10-24 NOTE — Telephone Encounter (Signed)
Message copied by Augusto Garbe on Thu Oct 24, 2013 11:06 AM ------      Message from: Hinton Lovely      Created: Wed Oct 23, 2013 12:57 PM      Regarding: 1st time chemo      Contact: [phone]       1st time alimta/carbo ------

## 2013-10-24 NOTE — Telephone Encounter (Signed)
Talked to pt and gave him appt for lab ,md and chemo for November and december 2014

## 2013-10-30 ENCOUNTER — Encounter: Payer: Self-pay | Admitting: Physician Assistant

## 2013-10-30 ENCOUNTER — Ambulatory Visit (HOSPITAL_BASED_OUTPATIENT_CLINIC_OR_DEPARTMENT_OTHER): Payer: Medicare Other | Admitting: Physician Assistant

## 2013-10-30 ENCOUNTER — Encounter (INDEPENDENT_AMBULATORY_CARE_PROVIDER_SITE_OTHER): Payer: Self-pay

## 2013-10-30 ENCOUNTER — Telehealth: Payer: Self-pay | Admitting: Internal Medicine

## 2013-10-30 ENCOUNTER — Other Ambulatory Visit (HOSPITAL_BASED_OUTPATIENT_CLINIC_OR_DEPARTMENT_OTHER): Payer: Medicare Other | Admitting: Lab

## 2013-10-30 DIAGNOSIS — C3410 Malignant neoplasm of upper lobe, unspecified bronchus or lung: Secondary | ICD-10-CM | POA: Diagnosis not present

## 2013-10-30 DIAGNOSIS — C7970 Secondary malignant neoplasm of unspecified adrenal gland: Secondary | ICD-10-CM

## 2013-10-30 LAB — CBC WITH DIFFERENTIAL/PLATELET
BASO%: 0.5 % (ref 0.0–2.0)
LYMPH%: 33.1 % (ref 14.0–49.0)
MCHC: 33.9 g/dL (ref 32.0–36.0)
MCV: 96.8 fL (ref 79.3–98.0)
MONO#: 0.4 10*3/uL (ref 0.1–0.9)
MONO%: 9.5 % (ref 0.0–14.0)
Platelets: 239 10*3/uL (ref 140–400)
RBC: 3.78 10*6/uL — ABNORMAL LOW (ref 4.20–5.82)
RDW: 13.6 % (ref 11.0–14.6)
WBC: 3.8 10*3/uL — ABNORMAL LOW (ref 4.0–10.3)

## 2013-10-30 LAB — COMPREHENSIVE METABOLIC PANEL (CC13)
ALT: 20 U/L (ref 0–55)
Alkaline Phosphatase: 71 U/L (ref 40–150)
Calcium: 9.2 mg/dL (ref 8.4–10.4)
Potassium: 4.9 mEq/L (ref 3.5–5.1)
Sodium: 129 mEq/L — ABNORMAL LOW (ref 136–145)
Total Bilirubin: 0.89 mg/dL (ref 0.20–1.20)
Total Protein: 6.4 g/dL (ref 6.4–8.3)

## 2013-10-30 NOTE — Patient Instructions (Signed)
Continue weekly labs as scheduled Followup in 2 weeks, prior to the start of the next scheduled cycle of chemotherapy

## 2013-10-30 NOTE — Telephone Encounter (Signed)
Gave pt appt for lab.md and chemo for december , emailed [place]

## 2013-10-30 NOTE — Progress Notes (Addendum)
[facility] [phone]   [phone]  SHARED VISIT PROGRESS NOTE  Lolita Patella, MD [address] [place] [address]  DIAGNOSIS: Stage IV (T1 A., N0, M1 B.) non-small cell lung cancer, adenocarcinoma with negative EGFR mutation and negative ALK gene translocation diagnosed in October of 2014.  Molecular profile: Negative for RET, ALK, BRAF, MET, EGFR.                                 Positive for ERBB2 A775T, [id number], KRAS G13E, MAP2K1 D67N  (see full report).  PRIOR THERAPY: None  CURRENT THERAPY: Systemic chemotherapy with carboplatin for AUC of 5 and Alimta 500 mg/M2 every 3 weeks. First dose given on 10/23/2013.  CHEMOTHERAPY INTENT: Palliative  CURRENT # OF CHEMOTHERAPY CYCLES: 1  CURRENT ANTIEMETICS: Zofran, Compazine and dexamethasone  CURRENT SMOKING STATUS: Former smoker  ORAL CHEMOTHERAPY AND CONSENT: None  CURRENT BISPHOSPHONATES USE: None  PAIN MANAGEMENT: 0/10  NARCOTICS INDUCED CONSTIPATION: None  LIVING WILL AND CODE STATUS: ?   INTERVAL HISTORY: Anthony Curry 72 y.o. male returns to the clinic today for a symptom management visit accompanied by his wife and another family member. He tolerated his first cycle of systemic chemotherapy with carboplatin and Alimta relatively well the exception of some fatigue and one episode of diarrhea that was resolved with Kaopectate. He reports he has a good appetite and recently has noticed he has a big "sweet tooth". The patient is feeling fine today with no other specific complaints. He denied having any significant chest pain, shortness breath, cough or hemoptysis. The patient denied having any weight loss or night sweats. He has no nausea or vomiting. He has no fever or chills.    MEDICAL HISTORY: Past Medical History  Diagnosis Date  . Hypertension   . GERD (gastroesophageal reflux disease)   . Alcohol abuse   . Osteoarthritis of left shoulder  02/03/2012  . Low sodium     ALLERGIES:  has No Known Allergies.  MEDICATIONS:  Current Outpatient Prescriptions  Medication Sig Dispense Refill  . ALPRAZolam (XANAX) 0.25 MG tablet Take 1 tablet (0.25 mg total) by mouth 3 (three) times daily.  90 tablet  0  . amLODipine (NORVASC) 10 MG tablet Take 10 mg by mouth daily.       . Ascorbic Acid (VITAMIN C PO) Take 1 tablet by mouth daily.      [name] atenolol (TENORMIN) 100 MG tablet Take 100 mg by mouth daily.       . calcium carbonate (OS-CAL) 600 MG TABS Take 600 mg by mouth daily with breakfast.      . Cholecalciferol (VITAMIN D-3) 5000 UNITS TABS Take 5,000 Units by mouth daily.       [name] demeclocycline (DECLOMYCIN) 150 MG tablet Take 150 mg by mouth 2 (two) times daily.      [name] dexamethasone (DECADRON) 4 MG tablet 4 milligrams by mouth twice a day the day before, day of and day after the chemotherapy every 3 weeks.  40 tablet  1  . feeding supplement (ENSURE COMPLETE) LIQD Take 237 mLs by mouth daily.  30 Bottle  0  . folic acid (FOLVITE) 1 MG tablet Take 1 tablet (1 mg total) by mouth daily.  30 tablet  6  . HYDROcodone-acetaminophen (NORCO/VICODIN) 5-325 MG per tablet Take 1-2 tablets by mouth every 4 (four) hours as needed.  30  tablet  0  . MAGNESIUM PO Take 1 tablet by mouth daily.      . Multiple Vitamin (MULTIVITAMIN WITH MINERALS) TABS Take 1 tablet by mouth daily.      [name] olmesartan (BENICAR) 40 MG tablet Take 40 mg by mouth daily.       [name] omeprazole (PRILOSEC) 20 MG capsule Take 20 mg by mouth daily.       . Potassium 99 MG TABS Take 1 tablet by mouth daily.      . prochlorperazine (COMPAZINE) 10 MG tablet Take 1 tablet (10 mg total) by mouth every 6 (six) hours as needed for nausea or vomiting.  60 tablet  0  . tamsulosin (FLOMAX) 0.4 MG CAPS capsule Take 0.4 mg by mouth 2 (two) times daily.      [name] thiamine (VITAMIN B-1) 100 MG tablet Take 1 tablet (100 mg total) by mouth daily.  30 tablet  6  . vitamin Anthony Curry 400 UNIT capsule Take 400 Units  by mouth daily.       No current facility-administered medications for this visit.    SURGICAL HISTORY:  Past Surgical History  Procedure Laterality Date  . Hemorrhoid surgery    . Ankle arthrodesis  1995    rt fx-hardware in  . Tonsillectomy    . Colonoscopy    . Excision / curettage bone cyst finger  2005    rt thumb  . Total shoulder arthroplasty  02/03/2012    Procedure: TOTAL SHOULDER ARTHROPLASTY;  Surgeon: Eulas Post, MD;  Location: [facility];  Service: Orthopedics;  Laterality: Left;    REVIEW OF SYSTEMS:  Constitutional: negative Eyes: negative Ears, nose, mouth, throat, and face: negative Respiratory: negative Cardiovascular: negative Gastrointestinal: negative Genitourinary:negative Integument/breast: negative Hematologic/lymphatic: negative Musculoskeletal:negative Neurological: negative Behavioral/Psych: negative Endocrine: negative Allergic/Immunologic: negative   PHYSICAL EXAMINATION: General appearance: alert, cooperative and no distress Head: Normocephalic, without obvious abnormality, atraumatic Neck: no adenopathy, no JVD, supple, symmetrical, trachea midline and thyroid not enlarged, symmetric, no tenderness/mass/nodules Lymph nodes: Cervical, supraclavicular, and axillary nodes normal. Resp: clear to auscultation bilaterally Back: symmetric, no curvature. ROM normal. No CVA tenderness. Cardio: regular rate and rhythm, S1, S2 normal, no murmur, click, rub or gallop GI: soft, non-tender; bowel sounds normal; no masses,  no organomegaly Extremities: extremities normal, atraumatic, no cyanosis or edema Neurologic: Alert and oriented X 3, normal strength and tone. Normal symmetric reflexes. Normal coordination and gait  ECOG PERFORMANCE STATUS: 1 - Symptomatic but completely ambulatory  Blood pressure 109/53, pulse 58, temperature 98 F (36.7 C), temperature source Oral, resp. rate 18, height 6' (1.829 m), weight 159 lb 3.2 oz (72.213  kg).  LABORATORY DATA: Lab Results  Component Value Date   WBC 3.8* 10/30/2013   HGB 12.4* 10/30/2013   HCT 36.6* 10/30/2013   MCV 96.8 10/30/2013   PLT 239 10/30/2013      Chemistry      Component Value Date/Time   NA 129* 10/30/2013 1042   NA 132* 09/13/2013 0816   K 4.9 10/30/2013 1042   K 3.8 09/13/2013 0816   CL 96 09/13/2013 0816   CO2 25 10/30/2013 1042   CO2 29 09/13/2013 0816   BUN 14.3 10/30/2013 1042   BUN 10 09/13/2013 0816   CREATININE 0.8 10/30/2013 1042   CREATININE 0.8 09/13/2013 0816      Component Value Date/Time   CALCIUM 9.2 10/30/2013 1042   CALCIUM 9.4 09/13/2013 0816   ALKPHOS 71 10/30/2013 1042   ALKPHOS  73 07/05/2013 0441   AST 21 10/30/2013 1042   AST 26 07/05/2013 0441   ALT 20 10/30/2013 1042   ALT 16 07/05/2013 0441   BILITOT 0.89 10/30/2013 1042   BILITOT 1.0 07/05/2013 0441       RADIOGRAPHIC STUDIES: No results found.  ASSESSMENT AND PLAN: This is a very pleasant 72 years old white male recently diagnosed with metastatic non-small cell lung cancer, adenocarcinoma with negative EGFR mutation and negative ALK gene translocation. Patient was discussed with and also seen by Dr. Arbutus Ped. He is status post 1 cycle of systemic chemotherapy with carboplatin for an AUC of 5 and Alimta 500 mg per meter squared given every 3 weeks. Overall he tolerated the first cycle relatively well the exception of fatigue and one episode of diarrhea. He will continue with weekly labs as scheduled and return in 2 weeks prior to the start of his second cycle of chemotherapy.The patient will continue his current treatment with folic acid 1 mg by mouth daily.  Laural Benes, Anthony Denherder Anthony Curry, Anthony Curry   The patient was advised to call immediately if he has any concerning symptoms in the interval. The patient voices understanding of current disease status and treatment options and is in agreement with the current care plan.  All questions were answered. The patient knows to call the  clinic with any problems, questions or concerns. We can certainly see the patient much sooner if necessary.  ADDENDUM: Hematology/Oncology Attending: I had a face to face encounter with the patient. I recommended his care plan. The patient tolerated the first cycle of his treatment fairly well with no significant complaints except for mild fatigue 2 days after his first dose. He denied having any significant fever or chills. He has no nausea or vomiting. I recommended for the patient to continue his treatment as scheduled. He would come back for follow up visit in 2 weeks with the next cycle of his chemotherapy. He was advised to call immediately if he has any concerning symptoms in the interval. Lajuana Matte., MD 11/01/2013

## 2013-11-01 ENCOUNTER — Other Ambulatory Visit: Payer: Medicare Other | Admitting: Lab

## 2013-11-01 ENCOUNTER — Ambulatory Visit: Payer: Medicare Other | Admitting: Physician Assistant

## 2013-11-01 ENCOUNTER — Telehealth: Payer: Self-pay | Admitting: *Deleted

## 2013-11-01 NOTE — Telephone Encounter (Signed)
Per staff message and POF I have scheduled appts.  JMW  

## 2013-11-06 ENCOUNTER — Other Ambulatory Visit (HOSPITAL_BASED_OUTPATIENT_CLINIC_OR_DEPARTMENT_OTHER): Payer: Medicare Other

## 2013-11-06 ENCOUNTER — Telehealth: Payer: Self-pay | Admitting: Internal Medicine

## 2013-11-06 DIAGNOSIS — C7970 Secondary malignant neoplasm of unspecified adrenal gland: Secondary | ICD-10-CM | POA: Diagnosis not present

## 2013-11-06 DIAGNOSIS — C3410 Malignant neoplasm of upper lobe, unspecified bronchus or lung: Secondary | ICD-10-CM | POA: Diagnosis not present

## 2013-11-06 LAB — CBC WITH DIFFERENTIAL/PLATELET
BASO%: 0.4 % (ref 0.0–2.0)
Basophils Absolute: 0 10e3/uL (ref 0.0–0.1)
EOS%: 2.6 % (ref 0.0–7.0)
Eosinophils Absolute: 0.1 10e3/uL (ref 0.0–0.5)
HCT: 36.2 % — ABNORMAL LOW (ref 38.4–49.9)
HGB: 12.2 g/dL — ABNORMAL LOW (ref 13.0–17.1)
LYMPH%: 29.7 % (ref 14.0–49.0)
MCH: 32.9 pg (ref 27.2–33.4)
MCHC: 33.9 g/dL (ref 32.0–36.0)
MCV: 97.1 fL (ref 79.3–98.0)
MONO#: 0.5 10e3/uL (ref 0.1–0.9)
MONO%: 13.7 % (ref 0.0–14.0)
NEUT#: 2 10e3/uL (ref 1.5–6.5)
NEUT%: 53.6 % (ref 39.0–75.0)
Platelets: 172 10e3/uL (ref 140–400)
RBC: 3.72 10e6/uL — ABNORMAL LOW (ref 4.20–5.82)
RDW: 14.2 % (ref 11.0–14.6)
WBC: 3.7 10e3/uL — ABNORMAL LOW (ref 4.0–10.3)
lymph#: 1.1 10e3/uL (ref 0.9–3.3)

## 2013-11-06 LAB — COMPREHENSIVE METABOLIC PANEL (CC13)
ALT: 25 U/L (ref 0–55)
Alkaline Phosphatase: 75 U/L (ref 40–150)
CO2: 26 mEq/L (ref 22–29)
Calcium: 9.4 mg/dL (ref 8.4–10.4)
Creatinine: 0.8 mg/dL (ref 0.7–1.3)
Sodium: 132 mEq/L — ABNORMAL LOW (ref 136–145)
Total Bilirubin: 0.56 mg/dL (ref 0.20–1.20)

## 2013-11-06 NOTE — Telephone Encounter (Signed)
Gave pt appt for lab,md and chemo for December 2014 °

## 2013-11-07 ENCOUNTER — Other Ambulatory Visit: Payer: Medicare Other | Admitting: Lab

## 2013-11-13 ENCOUNTER — Ambulatory Visit (HOSPITAL_BASED_OUTPATIENT_CLINIC_OR_DEPARTMENT_OTHER): Payer: Medicare Other | Admitting: Internal Medicine

## 2013-11-13 ENCOUNTER — Other Ambulatory Visit (HOSPITAL_BASED_OUTPATIENT_CLINIC_OR_DEPARTMENT_OTHER): Payer: Medicare Other

## 2013-11-13 ENCOUNTER — Encounter: Payer: Self-pay | Admitting: Internal Medicine

## 2013-11-13 ENCOUNTER — Telehealth: Payer: Self-pay | Admitting: Internal Medicine

## 2013-11-13 ENCOUNTER — Ambulatory Visit (HOSPITAL_BASED_OUTPATIENT_CLINIC_OR_DEPARTMENT_OTHER): Payer: Medicare Other

## 2013-11-13 DIAGNOSIS — C3410 Malignant neoplasm of upper lobe, unspecified bronchus or lung: Secondary | ICD-10-CM

## 2013-11-13 DIAGNOSIS — I10 Essential (primary) hypertension: Secondary | ICD-10-CM

## 2013-11-13 DIAGNOSIS — Z5111 Encounter for antineoplastic chemotherapy: Secondary | ICD-10-CM

## 2013-11-13 DIAGNOSIS — C7970 Secondary malignant neoplasm of unspecified adrenal gland: Secondary | ICD-10-CM

## 2013-11-13 LAB — CBC WITH DIFFERENTIAL/PLATELET
BASO%: 0.6 % (ref 0.0–2.0)
Eosinophils Absolute: 0.1 10*3/uL (ref 0.0–0.5)
HCT: 36.2 % — ABNORMAL LOW (ref 38.4–49.9)
HGB: 12.8 g/dL — ABNORMAL LOW (ref 13.0–17.1)
LYMPH%: 41 % (ref 14.0–49.0)
MCHC: 35.4 g/dL (ref 32.0–36.0)
MCV: 92.1 fL (ref 79.3–98.0)
MONO%: 16.9 % — ABNORMAL HIGH (ref 0.0–14.0)
NEUT#: 1.4 10*3/uL — ABNORMAL LOW (ref 1.5–6.5)
Platelets: ADEQUATE 10*3/uL (ref 140–400)
RBC: 3.93 10*6/uL — ABNORMAL LOW (ref 4.20–5.82)
RDW: 14.4 % (ref 11.0–14.6)
WBC: 3.6 10*3/uL — ABNORMAL LOW (ref 4.0–10.3)

## 2013-11-13 LAB — COMPREHENSIVE METABOLIC PANEL (CC13)
Albumin: 3.8 g/dL (ref 3.5–5.0)
Anion Gap: 10 mEq/L (ref 3–11)
BUN: 14.1 mg/dL (ref 7.0–26.0)
Calcium: 9.6 mg/dL (ref 8.4–10.4)
Chloride: 98 mEq/L (ref 98–109)
Glucose: 87 mg/dl (ref 70–140)
Potassium: 4.5 mEq/L (ref 3.5–5.1)

## 2013-11-13 MED ORDER — SODIUM CHLORIDE 0.9 % IV SOLN
500.0000 mg/m2 | Freq: Once | INTRAVENOUS | Status: AC
  Administered 2013-11-13: 950 mg via INTRAVENOUS
  Filled 2013-11-13: qty 38

## 2013-11-13 MED ORDER — SODIUM CHLORIDE 0.9 % IV SOLN
447.5000 mg | Freq: Once | INTRAVENOUS | Status: AC
  Administered 2013-11-13: 450 mg via INTRAVENOUS
  Filled 2013-11-13: qty 45

## 2013-11-13 MED ORDER — DEXAMETHASONE SODIUM PHOSPHATE 20 MG/5ML IJ SOLN
20.0000 mg | Freq: Once | INTRAMUSCULAR | Status: AC
  Administered 2013-11-13: 20 mg via INTRAVENOUS

## 2013-11-13 MED ORDER — ONDANSETRON 16 MG/50ML IVPB (CHCC)
16.0000 mg | Freq: Once | INTRAVENOUS | Status: AC
  Administered 2013-11-13: 16 mg via INTRAVENOUS

## 2013-11-13 MED ORDER — ONDANSETRON 16 MG/50ML IVPB (CHCC)
INTRAVENOUS | Status: AC
  Filled 2013-11-13: qty 16

## 2013-11-13 MED ORDER — SODIUM CHLORIDE 0.9 % IV SOLN
Freq: Once | INTRAVENOUS | Status: AC
  Administered 2013-11-13: 10:00:00 via INTRAVENOUS

## 2013-11-13 MED ORDER — DEXAMETHASONE SODIUM PHOSPHATE 20 MG/5ML IJ SOLN
INTRAMUSCULAR | Status: AC
  Filled 2013-11-13: qty 5

## 2013-11-13 NOTE — Patient Instructions (Signed)
CURRENT THERAPY: Systemic chemotherapy with carboplatin for AUC of 5 and Alimta 500 mg/M2 every 3 weeks, status post 1 cycle. First dose on 10/23/2013.  CHEMOTHERAPY INTENT: Palliative  CURRENT # OF CHEMOTHERAPY CYCLES: 1  CURRENT ANTIEMETICS: Zofran, Compazine and dexamethasone  CURRENT SMOKING STATUS: Former smoker  ORAL CHEMOTHERAPY AND CONSENT: None  CURRENT BISPHOSPHONATES USE: None  PAIN MANAGEMENT: 0/10  NARCOTICS INDUCED CONSTIPATION: None  LIVING WILL AND CODE STATUS: ?

## 2013-11-13 NOTE — Telephone Encounter (Signed)
gave pt appt for lab,md and chemo for December , left vm with michelle regarding chemo for Jnauary and adusting chemo for december

## 2013-11-13 NOTE — Patient Instructions (Signed)
[facility] Discharge Instructions for Patients Receiving Chemotherapy  Today you received the following chemotherapy agents Carbo and Alimta  To help prevent nausea and vomiting after your treatment, we encourage you to take your nausea medication as prescribed. If you develop nausea and vomiting that is not controlled by your nausea medication, call the clinic.   BELOW ARE SYMPTOMS THAT SHOULD BE REPORTED IMMEDIATELY:  *FEVER GREATER THAN 100.5 F  *CHILLS WITH OR WITHOUT FEVER  NAUSEA AND VOMITING THAT IS NOT CONTROLLED WITH YOUR NAUSEA MEDICATION  *UNUSUAL SHORTNESS OF BREATH  *UNUSUAL BRUISING OR BLEEDING  TENDERNESS IN MOUTH AND THROAT WITH OR WITHOUT PRESENCE OF ULCERS  *URINARY PROBLEMS  *BOWEL PROBLEMS  UNUSUAL RASH Items with * indicate a potential emergency and should be followed up as soon as possible.  Feel free to call the clinic you have any questions or concerns. The clinic phone number is [phone].

## 2013-11-13 NOTE — Progress Notes (Signed)
[facility] [phone]   [phone]  OFFICE PROGRESS NOTE  Lolita Patella, MD [address] [place] [address]  DIAGNOSIS: Stage IV (T1 A., N0, M1 B.) non-small cell lung cancer, adenocarcinoma with negative EGFR mutation and negative ALK gene translocation diagnosed in October of 2014.  Molecular profile: Negative for RET, ALK, BRAF, MET, EGFR.                                 Positive for ERBB2 A775T, [id number], KRAS G13E, MAP2K1 D67N  (see full report).  PRIOR THERAPY: None.  CURRENT THERAPY: Systemic chemotherapy with carboplatin for AUC of 5 and Alimta 500 mg/M2 every 3 weeks, status post 1 cycle. First dose on 10/23/2013.  CHEMOTHERAPY INTENT: Palliative  CURRENT # OF CHEMOTHERAPY CYCLES: 1  CURRENT ANTIEMETICS: Zofran, Compazine and dexamethasone  CURRENT SMOKING STATUS: Former smoker  ORAL CHEMOTHERAPY AND CONSENT: None  CURRENT BISPHOSPHONATES USE: None  PAIN MANAGEMENT: 0/10  NARCOTICS INDUCED CONSTIPATION: None  LIVING WILL AND CODE STATUS: ?   INTERVAL HISTORY: Erastus Bartolomei 72 y.o. male returns to the clinic today for followup visit accompanied by his wife. The patient is feeling fine today with no specific complaints. He denied having any significant chest pain, shortness breath, cough or hemoptysis. The patient denied having any weight loss or night sweats. He has no nausea or vomiting. He has no fever or chills. He tolerated the last cycle of his systemic chemotherapy with carboplatin and Alimta fairly well. He is here today to start cycle #2.  MEDICAL HISTORY: Past Medical History  Diagnosis Date  . Hypertension   . GERD (gastroesophageal reflux disease)   . Alcohol abuse   . 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 Low sodium     ALLERGIES:  has No Known Allergies.  MEDICATIONS:  Current Outpatient Prescriptions  Medication Sig Dispense Refill  . ALPRAZolam (XANAX) 0.25 MG  tablet Take 1 tablet (0.25 mg total) by mouth 3 (three) times daily.  90 tablet  0  . amLODipine (NORVASC) 10 MG tablet Take 10 mg by mouth daily.       . Ascorbic Acid (VITAMIN C PO) Take 1 tablet by mouth daily.      [name] atenolol (TENORMIN) 100 MG tablet Take 100 mg by mouth daily.       . calcium carbonate (OS-CAL) 600 MG TABS Take 600 mg by mouth daily with breakfast.      . Cholecalciferol (VITAMIN D-3) 5000 UNITS TABS Take 5,000 Units by mouth daily.       [name] demeclocycline (DECLOMYCIN) 150 MG tablet Take 150 mg by mouth 2 (two) times daily.      [name] dexamethasone (DECADRON) 4 MG tablet 4 milligrams by mouth twice a day the day before, day of and day after the chemotherapy every 3 weeks.  40 tablet  1  . feeding supplement (ENSURE COMPLETE) LIQD Take 237 mLs by mouth daily.  30 Bottle  0  . folic acid (FOLVITE) 1 MG tablet Take 1 tablet (1 mg total) by mouth daily.  30 tablet  6  . MAGNESIUM PO Take 1 tablet by mouth daily.      . Multiple Vitamin (MULTIVITAMIN WITH MINERALS) TABS Take 1 tablet by mouth daily.      [name] olmesartan (BENICAR) 40 MG tablet Take 40 mg by mouth daily.       [name]  omeprazole (PRILOSEC) 20 MG capsule Take 20 mg by mouth daily.       . Potassium 99 MG TABS Take 1 tablet by mouth daily.      . prochlorperazine (COMPAZINE) 10 MG tablet Take 1 tablet (10 mg total) by mouth every 6 (six) hours as needed for nausea or vomiting.  60 tablet  0  . tamsulosin (FLOMAX) 0.4 MG CAPS capsule Take 0.4 mg by mouth 2 (two) times daily.      [name] thiamine (VITAMIN B-1) 100 MG tablet Take 1 tablet (100 mg total) by mouth daily.  30 tablet  6  . vitamin E 400 UNIT capsule Take 400 Units by mouth daily.      [name] HYDROcodone-acetaminophen (NORCO/VICODIN) 5-325 MG per tablet Take 1-2 tablets by mouth every 4 (four) hours as needed.  30 tablet  0   No current facility-administered medications for this visit.    SURGICAL HISTORY:  Past Surgical History  Procedure Laterality Date  . Hemorrhoid  surgery    . Ankle arthrodesis  1995    rt fx-hardware in  . Tonsillectomy    . Colonoscopy    . Excision / curettage bone cyst finger  2005    rt thumb  . Total shoulder arthroplasty  02/03/2012    Procedure: TOTAL SHOULDER ARTHROPLASTY;  Surgeon: Eulas Post, MD;  Location: [facility];  Service: Orthopedics;  Laterality: Left;    REVIEW OF SYSTEMS:  Constitutional: negative Eyes: negative Ears, nose, mouth, throat, and face: negative Respiratory: negative Cardiovascular: negative Gastrointestinal: negative Genitourinary:negative Integument/breast: negative Hematologic/lymphatic: negative Musculoskeletal:negative Neurological: negative Behavioral/Psych: negative Endocrine: negative Allergic/Immunologic: negative   PHYSICAL EXAMINATION: General appearance: alert, cooperative and no distress Head: Normocephalic, without obvious abnormality, atraumatic Neck: no adenopathy, no JVD, supple, symmetrical, trachea midline and thyroid not enlarged, symmetric, no tenderness/mass/nodules Lymph nodes: Cervical, supraclavicular, and axillary nodes normal. Resp: clear to auscultation bilaterally Back: symmetric, no curvature. ROM normal. No CVA tenderness. Cardio: regular rate and rhythm, S1, S2 normal, no murmur, click, rub or gallop GI: soft, non-tender; bowel sounds normal; no masses,  no organomegaly Extremities: extremities normal, atraumatic, no cyanosis or edema Neurologic: Alert and oriented X 3, normal strength and tone. Normal symmetric reflexes. Normal coordination and gait  ECOG PERFORMANCE STATUS: 1 - Symptomatic but completely ambulatory  Blood pressure 115/61, pulse 67, temperature 97.2 F (36.2 C), temperature source Oral, resp. rate 18, height 6' (1.829 m), weight 163 lb 9.6 oz (74.208 kg), SpO2 97.00%.  LABORATORY DATA: Lab Results  Component Value Date   WBC 3.7* 11/06/2013   HGB 12.2* 11/06/2013   HCT 36.2* 11/06/2013   MCV 97.1 11/06/2013   PLT  172 11/06/2013      Chemistry      Component Value Date/Time   NA 132* 11/06/2013 1140   NA 132* 09/13/2013 0816   K 4.1 11/06/2013 1140   K 3.8 09/13/2013 0816   CL 96 09/13/2013 0816   CO2 26 11/06/2013 1140   CO2 29 09/13/2013 0816   BUN 16.3 11/06/2013 1140   BUN 10 09/13/2013 0816   CREATININE 0.8 11/06/2013 1140   CREATININE 0.8 09/13/2013 0816      Component Value Date/Time   CALCIUM 9.4 11/06/2013 1140   CALCIUM 9.4 09/13/2013 0816   ALKPHOS 75 11/06/2013 1140   ALKPHOS 73 07/05/2013 0441   AST 23 11/06/2013 1140   AST 26 07/05/2013 0441   ALT 25 11/06/2013 1140   ALT 16 07/05/2013 0441  BILITOT 0.56 11/06/2013 1140   BILITOT 1.0 07/05/2013 0441       RADIOGRAPHIC STUDIES: No results found.  ASSESSMENT AND PLAN: This is a very pleasant 72 years old white male recently diagnosed with metastatic non-small cell lung cancer, adenocarcinoma with negative EGFR mutation and negative ALK gene translocation. The patient tolerated the last cycle of his chemotherapy fairly well with no significant adverse effects. I recommended for him to proceed with cycle #2 today as scheduled.  The patient come back for followup visit in 3 weeks with the next cycle of his chemotherapy.  He was advised to call immediately if she has any concerning symptoms in the interval. All questions were answered.  The patient knows to call the clinic with any problems, questions or concerns. We can certainly see the patient much sooner if necessary.

## 2013-11-14 ENCOUNTER — Other Ambulatory Visit: Payer: Medicare Other

## 2013-11-20 ENCOUNTER — Telehealth: Payer: Self-pay | Admitting: *Deleted

## 2013-11-20 ENCOUNTER — Other Ambulatory Visit (HOSPITAL_BASED_OUTPATIENT_CLINIC_OR_DEPARTMENT_OTHER): Payer: Medicare Other

## 2013-11-20 ENCOUNTER — Ambulatory Visit (HOSPITAL_BASED_OUTPATIENT_CLINIC_OR_DEPARTMENT_OTHER): Payer: Medicare Other

## 2013-11-20 ENCOUNTER — Other Ambulatory Visit: Payer: Self-pay | Admitting: *Deleted

## 2013-11-20 DIAGNOSIS — Z5189 Encounter for other specified aftercare: Secondary | ICD-10-CM

## 2013-11-20 DIAGNOSIS — C3410 Malignant neoplasm of upper lobe, unspecified bronchus or lung: Secondary | ICD-10-CM | POA: Diagnosis not present

## 2013-11-20 DIAGNOSIS — C7970 Secondary malignant neoplasm of unspecified adrenal gland: Secondary | ICD-10-CM | POA: Diagnosis not present

## 2013-11-20 DIAGNOSIS — D709 Neutropenia, unspecified: Secondary | ICD-10-CM

## 2013-11-20 LAB — COMPREHENSIVE METABOLIC PANEL (CC13)
ALT: 25 U/L (ref 0–55)
AST: 23 U/L (ref 5–34)
Albumin: 3.6 g/dL (ref 3.5–5.0)
Alkaline Phosphatase: 67 U/L (ref 40–150)
BUN: 12.8 mg/dL (ref 7.0–26.0)
Chloride: 95 mEq/L — ABNORMAL LOW (ref 98–109)
Potassium: 4.4 mEq/L (ref 3.5–5.1)
Sodium: 129 mEq/L — ABNORMAL LOW (ref 136–145)

## 2013-11-20 LAB — CBC WITH DIFFERENTIAL/PLATELET
BASO%: 1.1 % (ref 0.0–2.0)
EOS%: 3.3 % (ref 0.0–7.0)
Eosinophils Absolute: 0.1 10*3/uL (ref 0.0–0.5)
MCH: 32.1 pg (ref 27.2–33.4)
MCHC: 36 g/dL (ref 32.0–36.0)
MONO#: 0.2 10*3/uL (ref 0.1–0.9)
NEUT%: 26.1 % — ABNORMAL LOW (ref 39.0–75.0)
RBC: 3.65 10*6/uL — ABNORMAL LOW (ref 4.20–5.82)
RDW: 14.1 % (ref 11.0–14.6)
WBC: 1.8 10*3/uL — ABNORMAL LOW (ref 4.0–10.3)
lymph#: 1 10*3/uL (ref 0.9–3.3)
nRBC: 0 % (ref 0–0)

## 2013-11-20 MED ORDER — FILGRASTIM 300 MCG/0.5ML IJ SOLN
300.0000 ug | Freq: Once | INTRAMUSCULAR | Status: DC
  Administered 2013-11-20: 300 ug via SUBCUTANEOUS
  Filled 2013-11-20: qty 0.5

## 2013-11-20 NOTE — Progress Notes (Signed)
Quick Note:  Call patient with the result ______ 

## 2013-11-20 NOTE — Patient Instructions (Signed)
Filgrastim, G-CSF injection What is this medicine? FILGRASTIM, G-CSF (fil GRA stim) stimulates the formation of white blood cells. This medicine is given to patients with conditions that may cause a decrease in white blood cells, like those receiving certain types of chemotherapy or bone marrow transplant. It helps the bone marrow recover its ability to produce white blood cells. Increasing the amount of white blood cells helps to decrease the risk of infection and fever. This medicine may be used for other purposes; ask your health care provider or pharmacist if you have questions. COMMON BRAND NAME(S): Neupogen What should I tell my health care provider before I take this medicine? They need to know if you have any of these conditions: -currently receiving radiation therapy -sickle cell disease -an unusual or allergic reaction to filgrastim, E. coli protein, other medicines, foods, dyes, or preservatives -pregnant or trying to get pregnant -breast-feeding How should I use this medicine? This medicine is for injection into a vein or injection under the skin. It is usually given by a health care professional in a hospital or clinic setting. If you get this medicine at home, you will be taught how to prepare and give this medicine. Always change the site for the injection under the skin. Let the solution warm to room temperature before you use it. Do not shake the solution before you withdraw a dose. Throw away any unused portion. Use exactly as directed. Take your medicine at regular intervals. Do not take your medicine more often than directed. It is important that you put your used needles and syringes in a special sharps container. Do not put them in a trash can. If you do not have a sharps container, call your pharmacist or healthcare provider to get one. Talk to your pediatrician regarding the use of this medicine in children. While this medicine may be prescribed for children for selected  conditions, precautions do apply. Overdosage: If you think you have taken too much of this medicine contact a poison control center or emergency room at once. NOTE: This medicine is only for you. Do not share this medicine with others. What if I miss a dose? Try not to miss doses. If you miss a dose take the dose as soon as you remember. If it is almost time for the next dose, do not take double doses unless told to by your doctor or health care professional. What may interact with this medicine? -lithium -medicines for cancer chemotherapy This list may not describe all possible interactions. Give your health care provider a list of all the medicines, herbs, non-prescription drugs, or dietary supplements you use. Also tell them if you smoke, drink alcohol, or use illegal drugs. Some items may interact with your medicine. What should I watch for while using this medicine? Visit your doctor or health care professional for regular checks on your progress. If you get a fever or any sign of infection while you are using this medicine, do not treat yourself. Check with your doctor or health care professional. Bone pain can usually be relieved by mild pain relievers such as acetaminophen or ibuprofen. Check with your doctor or health care professional before taking these medicines as they may hide a fever. Call your doctor or health care professional if the aches and pains are severe or do not go away. What side effects may I notice from receiving this medicine? Side effects that you should report to your doctor or health care professional as soon as possible: -allergic reactions   like skin rash, itching or hives, swelling of the face, lips, or tongue -difficulty breathing, wheezing -fever -pain, redness, or swelling at the injection site -stomach or side pain, or pain at the shoulder Side effects that usually do not require medical attention (report to your doctor or health care professional if they  continue or are bothersome): -bone pain (ribs, lower back, breast bone) -headache -skin rash This list may not describe all possible side effects. Call your doctor for medical advice about side effects. You may report side effects to FDA at 1-800-FDA-1088. Where should I keep my medicine? Keep out of the reach of children. Store in a refrigerator between 2 and 8 degrees C (36 and 46 degrees F). Do not freeze or leave in direct sunlight. If vials or syringes are left out of the refrigerator for more than 24 hours, they must be thrown away. Throw away unused vials after the expiration date on the carton. NOTE: This sheet is a summary. It may not cover all possible information. If you have questions about this medicine, talk to your doctor, pharmacist, or health care provider.  2014, [employer]vier/Gold Standard. (2008-02-06 13:33:21)  

## 2013-11-20 NOTE — Progress Notes (Signed)
Quick Note:  Call patient with the result and arrange for Neupogen for 2 days and neutropenic precautions ______

## 2013-11-20 NOTE — Telephone Encounter (Signed)
Pt verbalized understanding.  SLJ 

## 2013-11-20 NOTE — Telephone Encounter (Signed)
Message copied by Caren Griffins on Wed Nov 20, 2013  2:15 PM ------      Message from: Anthony Curry      Created: Wed Nov 20, 2013  1:23 PM       Call patient with the result and arrange for Neupogen for 2 days and neutropenic precautions ------

## 2013-11-21 ENCOUNTER — Other Ambulatory Visit: Payer: Medicare Other | Admitting: Lab

## 2013-11-21 ENCOUNTER — Ambulatory Visit (HOSPITAL_BASED_OUTPATIENT_CLINIC_OR_DEPARTMENT_OTHER): Payer: Medicare Other

## 2013-11-21 VITALS — BP 115/55 | HR 67 | Temp 97.5°F

## 2013-11-21 DIAGNOSIS — Z5189 Encounter for other specified aftercare: Secondary | ICD-10-CM | POA: Diagnosis not present

## 2013-11-21 DIAGNOSIS — C3410 Malignant neoplasm of upper lobe, unspecified bronchus or lung: Secondary | ICD-10-CM

## 2013-11-21 DIAGNOSIS — C7970 Secondary malignant neoplasm of unspecified adrenal gland: Secondary | ICD-10-CM

## 2013-11-21 DIAGNOSIS — D709 Neutropenia, unspecified: Secondary | ICD-10-CM

## 2013-11-21 MED ORDER — FILGRASTIM 300 MCG/0.5ML IJ SOLN
300.0000 ug | Freq: Once | INTRAMUSCULAR | Status: AC
  Administered 2013-11-21: 300 ug via SUBCUTANEOUS
  Filled 2013-11-21: qty 0.5

## 2013-11-27 ENCOUNTER — Other Ambulatory Visit (HOSPITAL_BASED_OUTPATIENT_CLINIC_OR_DEPARTMENT_OTHER): Payer: Medicare Other

## 2013-11-27 DIAGNOSIS — C7970 Secondary malignant neoplasm of unspecified adrenal gland: Secondary | ICD-10-CM

## 2013-11-27 DIAGNOSIS — C3410 Malignant neoplasm of upper lobe, unspecified bronchus or lung: Secondary | ICD-10-CM | POA: Diagnosis not present

## 2013-11-27 LAB — COMPREHENSIVE METABOLIC PANEL (CC13)
Alkaline Phosphatase: 79 U/L (ref 40–150)
BUN: 11.8 mg/dL (ref 7.0–26.0)
CO2: 25 mEq/L (ref 22–29)
Calcium: 9.5 mg/dL (ref 8.4–10.4)
Glucose: 101 mg/dl (ref 70–140)
Sodium: 130 mEq/L — ABNORMAL LOW (ref 136–145)
Total Bilirubin: 0.66 mg/dL (ref 0.20–1.20)

## 2013-11-27 LAB — CBC WITH DIFFERENTIAL/PLATELET
Basophils Absolute: 0 10*3/uL (ref 0.0–0.1)
Eosinophils Absolute: 0 10*3/uL (ref 0.0–0.5)
HCT: 35.5 % — ABNORMAL LOW (ref 38.4–49.9)
LYMPH%: 46.8 % (ref 14.0–49.0)
MCV: 96.1 fL (ref 79.3–98.0)
MONO%: 21.5 % — ABNORMAL HIGH (ref 0.0–14.0)
NEUT#: 0.9 10*3/uL — ABNORMAL LOW (ref 1.5–6.5)
NEUT%: 29.9 % — ABNORMAL LOW (ref 39.0–75.0)
Platelets: 189 10*3/uL (ref 140–400)
RBC: 3.69 10*6/uL — ABNORMAL LOW (ref 4.20–5.82)
WBC: 2.9 10*3/uL — ABNORMAL LOW (ref 4.0–10.3)

## 2013-11-29 ENCOUNTER — Other Ambulatory Visit: Payer: Medicare Other

## 2013-12-04 ENCOUNTER — Other Ambulatory Visit (HOSPITAL_BASED_OUTPATIENT_CLINIC_OR_DEPARTMENT_OTHER): Payer: Medicare Other

## 2013-12-04 ENCOUNTER — Encounter: Payer: Self-pay | Admitting: Physician Assistant

## 2013-12-04 ENCOUNTER — Telehealth: Payer: Self-pay | Admitting: *Deleted

## 2013-12-04 ENCOUNTER — Ambulatory Visit (HOSPITAL_BASED_OUTPATIENT_CLINIC_OR_DEPARTMENT_OTHER): Payer: Medicare Other | Admitting: Physician Assistant

## 2013-12-04 ENCOUNTER — Ambulatory Visit (HOSPITAL_BASED_OUTPATIENT_CLINIC_OR_DEPARTMENT_OTHER): Payer: Medicare Other

## 2013-12-04 DIAGNOSIS — C7970 Secondary malignant neoplasm of unspecified adrenal gland: Secondary | ICD-10-CM

## 2013-12-04 DIAGNOSIS — C3410 Malignant neoplasm of upper lobe, unspecified bronchus or lung: Secondary | ICD-10-CM

## 2013-12-04 DIAGNOSIS — D701 Agranulocytosis secondary to cancer chemotherapy: Secondary | ICD-10-CM

## 2013-12-04 DIAGNOSIS — Z5111 Encounter for antineoplastic chemotherapy: Secondary | ICD-10-CM

## 2013-12-04 LAB — CBC WITH DIFFERENTIAL/PLATELET
Basophils Absolute: 0 10*3/uL (ref 0.0–0.1)
Eosinophils Absolute: 0.1 10*3/uL (ref 0.0–0.5)
HCT: 35.7 % — ABNORMAL LOW (ref 38.4–49.9)
HGB: 12.8 g/dL — ABNORMAL LOW (ref 13.0–17.1)
LYMPH%: 42.3 % (ref 14.0–49.0)
MONO#: 0.6 10*3/uL (ref 0.1–0.9)
MONO%: 17.5 % — ABNORMAL HIGH (ref 0.0–14.0)
NEUT#: 1.3 10*3/uL — ABNORMAL LOW (ref 1.5–6.5)
NEUT%: 37.6 % — ABNORMAL LOW (ref 39.0–75.0)
Platelets: 176 10*3/uL (ref 140–400)
WBC: 3.4 10*3/uL — ABNORMAL LOW (ref 4.0–10.3)

## 2013-12-04 LAB — COMPREHENSIVE METABOLIC PANEL (CC13)
ALT: 21 U/L (ref 0–55)
Albumin: 3.9 g/dL (ref 3.5–5.0)
Alkaline Phosphatase: 71 U/L (ref 40–150)
Anion Gap: 10 mEq/L (ref 3–11)
CO2: 24 mEq/L (ref 22–29)
Calcium: 9.3 mg/dL (ref 8.4–10.4)
Chloride: 96 mEq/L — ABNORMAL LOW (ref 98–109)
Glucose: 96 mg/dl (ref 70–140)
Sodium: 129 mEq/L — ABNORMAL LOW (ref 136–145)
Total Bilirubin: 0.58 mg/dL (ref 0.20–1.20)
Total Protein: 6.6 g/dL (ref 6.4–8.3)

## 2013-12-04 MED ORDER — SODIUM CHLORIDE 0.9 % IV SOLN
Freq: Once | INTRAVENOUS | Status: AC
  Administered 2013-12-04: 11:00:00 via INTRAVENOUS

## 2013-12-04 MED ORDER — ONDANSETRON 16 MG/50ML IVPB (CHCC)
16.0000 mg | Freq: Once | INTRAVENOUS | Status: AC
  Administered 2013-12-04: 16 mg via INTRAVENOUS

## 2013-12-04 MED ORDER — CYANOCOBALAMIN 1000 MCG/ML IJ SOLN
1000.0000 ug | Freq: Once | INTRAMUSCULAR | Status: AC
  Administered 2013-12-04: 1000 ug via INTRAMUSCULAR

## 2013-12-04 MED ORDER — SODIUM CHLORIDE 0.9 % IV SOLN
500.0000 mg/m2 | Freq: Once | INTRAVENOUS | Status: AC
  Administered 2013-12-04: 950 mg via INTRAVENOUS
  Filled 2013-12-04: qty 38

## 2013-12-04 MED ORDER — SODIUM CHLORIDE 0.9 % IV SOLN
447.5000 mg | Freq: Once | INTRAVENOUS | Status: AC
  Administered 2013-12-04: 450 mg via INTRAVENOUS
  Filled 2013-12-04: qty 45

## 2013-12-04 MED ORDER — DEXAMETHASONE SODIUM PHOSPHATE 20 MG/5ML IJ SOLN
20.0000 mg | Freq: Once | INTRAMUSCULAR | Status: AC
  Administered 2013-12-04: 20 mg via INTRAVENOUS

## 2013-12-04 MED ORDER — ONDANSETRON 16 MG/50ML IVPB (CHCC)
INTRAVENOUS | Status: AC
  Filled 2013-12-04: qty 16

## 2013-12-04 MED ORDER — CYANOCOBALAMIN 1000 MCG/ML IJ SOLN
INTRAMUSCULAR | Status: AC
  Filled 2013-12-04: qty 1

## 2013-12-04 MED ORDER — DEXAMETHASONE SODIUM PHOSPHATE 20 MG/5ML IJ SOLN
INTRAMUSCULAR | Status: AC
  Filled 2013-12-04: qty 5

## 2013-12-04 MED ORDER — PEGFILGRASTIM INJECTION 6 MG/0.6ML: 6.0000 mg | Freq: Once | SUBCUTANEOUS | Status: DC

## 2013-12-04 NOTE — Patient Instructions (Signed)
[facility] Discharge Instructions for Patients Receiving Chemotherapy  Today you received the following chemotherapy agents Alimta/Carboplatin To help prevent nausea and vomiting after your treatment, we encourage you to take your nausea medication as prescribed.  If you develop nausea and vomiting that is not controlled by your nausea medication, call the clinic.   BELOW ARE SYMPTOMS THAT SHOULD BE REPORTED IMMEDIATELY:  *FEVER GREATER THAN 100.5 F  *CHILLS WITH OR WITHOUT FEVER  NAUSEA AND VOMITING THAT IS NOT CONTROLLED WITH YOUR NAUSEA MEDICATION  *UNUSUAL SHORTNESS OF BREATH  *UNUSUAL BRUISING OR BLEEDING  TENDERNESS IN MOUTH AND THROAT WITH OR WITHOUT PRESENCE OF ULCERS  *URINARY PROBLEMS  *BOWEL PROBLEMS  UNUSUAL RASH Items with * indicate a potential emergency and should be followed up as soon as possible.  Feel free to call the clinic you have any questions or concerns. The clinic phone number is [phone].    

## 2013-12-04 NOTE — Telephone Encounter (Signed)
appts made and printed. Pt is aware that his tx will be adjusted. i emailed MW to adjust tx. Pt is aware that cs will call w/ appt for CT abd/ pelvis/ chest...td

## 2013-12-04 NOTE — Progress Notes (Signed)
OK to treat per Tiana Loft, PA with [facility] 1.3

## 2013-12-04 NOTE — Telephone Encounter (Signed)
Per staff message and POF I have scheduled appts.  JMW  

## 2013-12-04 NOTE — Patient Instructions (Signed)
Return on 12-2013 for a Neulasta injection for neutrophil support Continue weekly labs as scheduled Followup in 3 weeks with restaging CT scan of your chest, abdomen and pelvis to reevaluate your disease

## 2013-12-04 NOTE — Progress Notes (Addendum)
[facility] [phone]   [phone]  OFFICE PROGRESS NOTE  Lolita Patella, MD [phone] [place] [address]  DIAGNOSIS: Stage IV (T1 A., N0, M1 B.) non-small cell lung cancer, adenocarcinoma with negative EGFR mutation and negative ALK gene translocation diagnosed in October of 2014.  Molecular profile: Negative for RET, ALK, BRAF, MET, EGFR.                                 Positive for ERBB2 A775T, [id number], KRAS G13E, MAP2K1 D67N  (see full report).  PRIOR THERAPY: None.  CURRENT THERAPY: Systemic chemotherapy with carboplatin for AUC of 5 and Alimta 500 mg/M2 every 3 weeks, status post 2 cycles. First dose on 10/23/2013.  CHEMOTHERAPY INTENT: Palliative  CURRENT # OF CHEMOTHERAPY CYCLES: 3  CURRENT ANTIEMETICS: Zofran, Compazine and dexamethasone  CURRENT SMOKING STATUS: Former smoker  ORAL CHEMOTHERAPY AND CONSENT: None  CURRENT BISPHOSPHONATES USE: None  PAIN MANAGEMENT: 0/10  NARCOTICS INDUCED CONSTIPATION: None  LIVING WILL AND CODE STATUS: ?   INTERVAL HISTORY: Anthony Curry 72 y.o. male returns to the clinic today for followup visit accompanied by his wife. The patient is feeling fine today with no specific complaints. He denied having any significant chest pain, shortness breath, cough or hemoptysis. The patient denied having any weight loss or night sweats. He has no nausea or vomiting. He has no fever or chills. He tolerated the last cycle of his systemic chemotherapy with carboplatin and Alimta fairly well. He is here today to start cycle #3. Patient reports he is not sure if he is taking the dexamethasone and we'll check this when he returns home today.  MEDICAL HISTORY: Past Medical History  Diagnosis Date  . Hypertension   . GERD (gastroesophageal reflux disease)   . Alcohol abuse   . 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 Low sodium     ALLERGIES:  has No Known  Allergies.  MEDICATIONS:  Current Outpatient Prescriptions  Medication Sig Dispense Refill  . ALPRAZolam (XANAX) 0.25 MG tablet Take 1 tablet (0.25 mg total) by mouth 3 (three) times daily.  90 tablet  0  . amLODipine (NORVASC) 10 MG tablet Take 10 mg by mouth daily.       . Ascorbic Acid (VITAMIN C PO) Take 1 tablet by mouth daily.      [name] atenolol (TENORMIN) 100 MG tablet Take 100 mg by mouth daily.       . calcium carbonate (OS-CAL) 600 MG TABS Take 600 mg by mouth daily with breakfast.      . Cholecalciferol (VITAMIN D-3) 5000 UNITS TABS Take 5,000 Units by mouth daily.       [name] demeclocycline (DECLOMYCIN) 150 MG tablet Take 150 mg by mouth 2 (two) times daily.      . feeding supplement (ENSURE COMPLETE) LIQD Take 237 mLs by mouth daily.  30 Bottle  0  . folic acid (FOLVITE) 1 MG tablet Take 1 tablet (1 mg total) by mouth daily.  30 tablet  6  . MAGNESIUM PO Take 1 tablet by mouth daily.      . Multiple Vitamin (MULTIVITAMIN WITH MINERALS) TABS Take 1 tablet by mouth daily.      [name] olmesartan (BENICAR) 40 MG tablet Take 40 mg by mouth daily.       [name] omeprazole (PRILOSEC) 20 MG capsule Take 20 mg by mouth  daily.       . Potassium 99 MG TABS Take 1 tablet by mouth daily.      . tamsulosin (FLOMAX) 0.4 MG CAPS capsule Take 0.4 mg by mouth 2 (two) times daily.      [name] thiamine (VITAMIN B-1) 100 MG tablet Take 1 tablet (100 mg total) by mouth daily.  30 tablet  6  . vitamin E 400 UNIT capsule Take 400 Units by mouth daily.      [name] dexamethasone (DECADRON) 4 MG tablet 4 milligrams by mouth twice a day the day before, day of and day after the chemotherapy every 3 weeks.  40 tablet  1  . HYDROcodone-acetaminophen (NORCO/VICODIN) 5-325 MG per tablet Take 1-2 tablets by mouth every 4 (four) hours as needed.  30 tablet  0  . prochlorperazine (COMPAZINE) 10 MG tablet Take 1 tablet (10 mg total) by mouth every 6 (six) hours as needed for nausea or vomiting.  60 tablet  0   No current  facility-administered medications for this visit.    SURGICAL HISTORY:  Past Surgical History  Procedure Laterality Date  . Hemorrhoid surgery    . Ankle arthrodesis  1995    rt fx-hardware in  . Tonsillectomy    . Colonoscopy    . Excision / curettage bone cyst finger  2005    rt thumb  . Total shoulder arthroplasty  02/03/2012    Procedure: TOTAL SHOULDER ARTHROPLASTY;  Surgeon: Eulas Post, MD;  Location: [facility];  Service: Orthopedics;  Laterality: Left;    REVIEW OF SYSTEMS:  Constitutional: negative Eyes: negative Ears, nose, mouth, throat, and face: negative Respiratory: negative Cardiovascular: negative Gastrointestinal: negative Genitourinary:negative Integument/breast: negative Hematologic/lymphatic: negative Musculoskeletal:negative Neurological: negative Behavioral/Psych: negative Endocrine: negative Allergic/Immunologic: negative   PHYSICAL EXAMINATION: General appearance: alert, cooperative and no distress Head: Normocephalic, without obvious abnormality, atraumatic Neck: no adenopathy, no JVD, supple, symmetrical, trachea midline and thyroid not enlarged, symmetric, no tenderness/mass/nodules Lymph nodes: Cervical, supraclavicular, and axillary nodes normal. Resp: clear to auscultation bilaterally Back: symmetric, no curvature. ROM normal. No CVA tenderness. Cardio: regular rate and rhythm, S1, S2 normal, no murmur, click, rub or gallop GI: soft, non-tender; bowel sounds normal; no masses,  no organomegaly Extremities: extremities normal, atraumatic, no cyanosis or edema Neurologic: Alert and oriented X 3, normal strength and tone. Normal symmetric reflexes. Normal coordination and gait  ECOG PERFORMANCE STATUS: 1 - Symptomatic but completely ambulatory  Blood pressure 100/59, pulse 58, temperature 97 F (36.1 C), temperature source Oral, resp. rate 18, height 6' (1.829 m), weight 162 lb 14.4 oz (73.891 kg), SpO2 98.00%.  LABORATORY  DATA: Lab Results  Component Value Date   WBC 3.4* 12/04/2013   HGB 12.8* 12/04/2013   HCT 35.7* 12/04/2013   MCV 91.3 12/04/2013   PLT 176 12/04/2013      Chemistry      Component Value Date/Time   NA 129* 12/04/2013 0833   NA 132* 09/13/2013 0816   K 4.4 12/04/2013 0833   K 3.8 09/13/2013 0816   CL 96 09/13/2013 0816   CO2 24 12/04/2013 0833   CO2 29 09/13/2013 0816   BUN 14.2 12/04/2013 0833   BUN 10 09/13/2013 0816   CREATININE 0.7 12/04/2013 0833   CREATININE 0.8 09/13/2013 0816      Component Value Date/Time   CALCIUM 9.3 12/04/2013 0833   CALCIUM 9.4 09/13/2013 0816   ALKPHOS 71 12/04/2013 0833   ALKPHOS 73 07/05/2013 0441   AST 20  12/04/2013 0833   AST 26 07/05/2013 0441   ALT 21 12/04/2013 0833   ALT 16 07/05/2013 0441   BILITOT 0.58 12/04/2013 0833   BILITOT 1.0 07/05/2013 0441       RADIOGRAPHIC STUDIES: No results found.  ASSESSMENT AND PLAN: This is a very pleasant 72 years old white male recently diagnosed with metastatic non-small cell lung cancer, adenocarcinoma with negative EGFR mutation and negative ALK gene translocation. The patient tolerated the last cycle of his chemotherapy fairly well with no significant adverse effects. Patient was discussed with also seen by Dr. Arbutus Ped. He will proceed with cycle #3 today although due to the fact that his ANC is slightly subtherapeutic at 1.3 we'll have the patient return on 12-2013 for a Neulasta injection for neutrophil support. He will continue with weekly labs as scheduled. He'll followup with Dr. Arbutus Ped in 3 weeks with restaging CT scan of the chest, abdomen and pelvis with contrast to reevaluate his disease. The patient will let us know whether he needs a new prescription for dexamethasone.   He was advised to call immediately if she has any concerning symptoms in the interval. All questions were answered.  The patient knows to call the clinic with any problems, questions or concerns. We can certainly see the  patient much sooner if necessary.  Conni Slipper PA-C  ADDENDUM:  Hematology/Oncology Attending:  I had the face-to-face encounter with the patient. I recommended his care plan. His is a very pleasant 72 years old white male with metastatic non-small cell lung cancer, adenocarcinoma currently undergoing systemic chemotherapy with carboplatin and Alimta status post 2 cycles. The patient is tolerating his treatment fairly well with no significant adverse effects. His absolute neutrophil count is subtherapeutic today and there is a concern about neutropenia after the third cycle.  I recommended for the patient to proceed with the third cycle today as scheduled but he would receive Neulasta injection tomorrow for neutropenic prophylaxis. He would have repeat CT scan of the chest, abdomen and pelvis for restaging of his disease before his upcoming visit in 3 weeks. The patient was advised to call immediately if he has any concerning symptoms in the interval. Lajuana Matte., MD 12/05/2013

## 2013-12-06 ENCOUNTER — Ambulatory Visit (HOSPITAL_BASED_OUTPATIENT_CLINIC_OR_DEPARTMENT_OTHER): Payer: Medicare Other

## 2013-12-06 DIAGNOSIS — C3410 Malignant neoplasm of upper lobe, unspecified bronchus or lung: Secondary | ICD-10-CM | POA: Diagnosis not present

## 2013-12-06 DIAGNOSIS — C7970 Secondary malignant neoplasm of unspecified adrenal gland: Secondary | ICD-10-CM

## 2013-12-06 DIAGNOSIS — Z5189 Encounter for other specified aftercare: Secondary | ICD-10-CM

## 2013-12-06 MED ORDER — PEGFILGRASTIM INJECTION 6 MG/0.6ML
6.0000 mg | Freq: Once | SUBCUTANEOUS | Status: AC
  Administered 2013-12-06: 6 mg via SUBCUTANEOUS
  Filled 2013-12-06: qty 0.6

## 2013-12-11 ENCOUNTER — Other Ambulatory Visit (HOSPITAL_BASED_OUTPATIENT_CLINIC_OR_DEPARTMENT_OTHER): Payer: Medicare Other

## 2013-12-11 ENCOUNTER — Other Ambulatory Visit: Payer: Medicare Other

## 2013-12-11 DIAGNOSIS — C3410 Malignant neoplasm of upper lobe, unspecified bronchus or lung: Secondary | ICD-10-CM

## 2013-12-11 DIAGNOSIS — C7970 Secondary malignant neoplasm of unspecified adrenal gland: Secondary | ICD-10-CM

## 2013-12-11 LAB — CBC WITH DIFFERENTIAL/PLATELET
BASO%: 0.2 % (ref 0.0–2.0)
BASOS ABS: 0 10*3/uL (ref 0.0–0.1)
EOS%: 1.1 % (ref 0.0–7.0)
Eosinophils Absolute: 0.2 10*3/uL (ref 0.0–0.5)
HEMATOCRIT: 32.6 % — AB (ref 38.4–49.9)
HGB: 11.7 g/dL — ABNORMAL LOW (ref 13.0–17.1)
LYMPH#: 2 10*3/uL (ref 0.9–3.3)
LYMPH%: 11.9 % — AB (ref 14.0–49.0)
MCH: 32.4 pg (ref 27.2–33.4)
MCHC: 35.9 g/dL (ref 32.0–36.0)
MCV: 90.3 fL (ref 79.3–98.0)
MONO#: 2.6 10*3/uL — ABNORMAL HIGH (ref 0.1–0.9)
MONO%: 15.6 % — ABNORMAL HIGH (ref 0.0–14.0)
NEUT#: 11.7 10*3/uL — ABNORMAL HIGH (ref 1.5–6.5)
NEUT%: 71.2 % (ref 39.0–75.0)
Platelets: 110 10*3/uL — ABNORMAL LOW (ref 140–400)
RBC: 3.61 10*6/uL — ABNORMAL LOW (ref 4.20–5.82)
RDW: 17.5 % — ABNORMAL HIGH (ref 11.0–14.6)
WBC: 16.5 10*3/uL — ABNORMAL HIGH (ref 4.0–10.3)
nRBC: 0 % (ref 0–0)

## 2013-12-11 LAB — COMPREHENSIVE METABOLIC PANEL (CC13)
ALBUMIN: 3.9 g/dL (ref 3.5–5.0)
ALK PHOS: 132 U/L (ref 40–150)
ALT: 15 U/L (ref 0–55)
AST: 19 U/L (ref 5–34)
Anion Gap: 8 mEq/L (ref 3–11)
BUN: 12.5 mg/dL (ref 7.0–26.0)
CALCIUM: 9.6 mg/dL (ref 8.4–10.4)
CO2: 28 mEq/L (ref 22–29)
Chloride: 94 mEq/L — ABNORMAL LOW (ref 98–109)
Creatinine: 0.8 mg/dL (ref 0.7–1.3)
GLUCOSE: 97 mg/dL (ref 70–140)
POTASSIUM: 4.5 meq/L (ref 3.5–5.1)
Sodium: 130 mEq/L — ABNORMAL LOW (ref 136–145)
Total Bilirubin: 0.39 mg/dL (ref 0.20–1.20)
Total Protein: 6.5 g/dL (ref 6.4–8.3)

## 2013-12-18 ENCOUNTER — Other Ambulatory Visit (HOSPITAL_BASED_OUTPATIENT_CLINIC_OR_DEPARTMENT_OTHER): Payer: Medicare Other

## 2013-12-18 DIAGNOSIS — C3410 Malignant neoplasm of upper lobe, unspecified bronchus or lung: Secondary | ICD-10-CM | POA: Diagnosis not present

## 2013-12-18 DIAGNOSIS — C7970 Secondary malignant neoplasm of unspecified adrenal gland: Secondary | ICD-10-CM

## 2013-12-18 LAB — CBC WITH DIFFERENTIAL/PLATELET
BASO%: 1.1 % (ref 0.0–2.0)
Basophils Absolute: 0.1 10*3/uL (ref 0.0–0.1)
EOS ABS: 0 10*3/uL (ref 0.0–0.5)
EOS%: 0.7 % (ref 0.0–7.0)
HCT: 35 % — ABNORMAL LOW (ref 38.4–49.9)
HGB: 12.3 g/dL — ABNORMAL LOW (ref 13.0–17.1)
LYMPH%: 21.4 % (ref 14.0–49.0)
MCH: 34 pg — ABNORMAL HIGH (ref 27.2–33.4)
MCHC: 35.1 g/dL (ref 32.0–36.0)
MCV: 96.8 fL (ref 79.3–98.0)
MONO#: 0.8 10*3/uL (ref 0.1–0.9)
MONO%: 12 % (ref 0.0–14.0)
NEUT%: 64.8 % (ref 39.0–75.0)
NEUTROS ABS: 4.1 10*3/uL (ref 1.5–6.5)
PLATELETS: 232 10*3/uL (ref 140–400)
RBC: 3.62 10*6/uL — AB (ref 4.20–5.82)
RDW: 20.2 % — AB (ref 11.0–14.6)
WBC: 6.4 10*3/uL (ref 4.0–10.3)
lymph#: 1.4 10*3/uL (ref 0.9–3.3)

## 2013-12-18 LAB — COMPREHENSIVE METABOLIC PANEL (CC13)
ALT: 20 U/L (ref 0–55)
ANION GAP: 9 meq/L (ref 3–11)
AST: 19 U/L (ref 5–34)
Albumin: 3.9 g/dL (ref 3.5–5.0)
Alkaline Phosphatase: 97 U/L (ref 40–150)
BILIRUBIN TOTAL: 0.46 mg/dL (ref 0.20–1.20)
BUN: 11.4 mg/dL (ref 7.0–26.0)
CHLORIDE: 96 meq/L — AB (ref 98–109)
CO2: 25 meq/L (ref 22–29)
Calcium: 9.5 mg/dL (ref 8.4–10.4)
Creatinine: 0.8 mg/dL (ref 0.7–1.3)
Glucose: 98 mg/dl (ref 70–140)
Potassium: 4.6 mEq/L (ref 3.5–5.1)
Sodium: 130 mEq/L — ABNORMAL LOW (ref 136–145)
TOTAL PROTEIN: 6.4 g/dL (ref 6.4–8.3)

## 2013-12-23 ENCOUNTER — Ambulatory Visit (HOSPITAL_COMMUNITY)
Admission: RE | Admit: 2013-12-23 | Discharge: 2013-12-23 | Disposition: A | Discharge status: Home / Self Care | Payer: Medicare Other | Source: Ambulatory Visit | Attending: Physician Assistant | Admitting: Physician Assistant

## 2013-12-23 ENCOUNTER — Encounter (HOSPITAL_COMMUNITY): Payer: Self-pay

## 2013-12-23 DIAGNOSIS — R091 Pleurisy: Secondary | ICD-10-CM | POA: Insufficient documentation

## 2013-12-23 DIAGNOSIS — C7970 Secondary malignant neoplasm of unspecified adrenal gland: Secondary | ICD-10-CM | POA: Insufficient documentation

## 2013-12-23 DIAGNOSIS — N3289 Other specified disorders of bladder: Secondary | ICD-10-CM | POA: Diagnosis not present

## 2013-12-23 DIAGNOSIS — I700 Atherosclerosis of aorta: Secondary | ICD-10-CM | POA: Diagnosis not present

## 2013-12-23 DIAGNOSIS — M949 Disorder of cartilage, unspecified: Secondary | ICD-10-CM

## 2013-12-23 DIAGNOSIS — IMO0002 Reserved for concepts with insufficient information to code with codable children: Secondary | ICD-10-CM | POA: Diagnosis not present

## 2013-12-23 DIAGNOSIS — J438 Other emphysema: Secondary | ICD-10-CM | POA: Diagnosis not present

## 2013-12-23 DIAGNOSIS — M899 Disorder of bone, unspecified: Secondary | ICD-10-CM | POA: Insufficient documentation

## 2013-12-23 DIAGNOSIS — K7689 Other specified diseases of liver: Secondary | ICD-10-CM | POA: Insufficient documentation

## 2013-12-23 DIAGNOSIS — C3490 Malignant neoplasm of unspecified part of unspecified bronchus or lung: Secondary | ICD-10-CM | POA: Insufficient documentation

## 2013-12-23 DIAGNOSIS — I2510 Atherosclerotic heart disease of native coronary artery without angina pectoris: Secondary | ICD-10-CM | POA: Insufficient documentation

## 2013-12-23 DIAGNOSIS — Z79899 Other long term (current) drug therapy: Secondary | ICD-10-CM | POA: Diagnosis not present

## 2013-12-23 DIAGNOSIS — R918 Other nonspecific abnormal finding of lung field: Secondary | ICD-10-CM | POA: Diagnosis not present

## 2013-12-23 DIAGNOSIS — K8020 Calculus of gallbladder without cholecystitis without obstruction: Secondary | ICD-10-CM | POA: Insufficient documentation

## 2013-12-23 MED ORDER — IOHEXOL 300 MG/ML  SOLN
100.0000 mL | Freq: Once | INTRAMUSCULAR | Status: AC | PRN
Start: 2013-12-23 — End: 2013-12-23
  Administered 2013-12-23: 100 mL via INTRAVENOUS

## 2013-12-25 ENCOUNTER — Other Ambulatory Visit (HOSPITAL_BASED_OUTPATIENT_CLINIC_OR_DEPARTMENT_OTHER): Payer: Medicare Other

## 2013-12-25 ENCOUNTER — Encounter: Payer: Self-pay | Admitting: Internal Medicine

## 2013-12-25 ENCOUNTER — Other Ambulatory Visit: Payer: Medicare Other

## 2013-12-25 ENCOUNTER — Telehealth: Payer: Self-pay | Admitting: Internal Medicine

## 2013-12-25 ENCOUNTER — Ambulatory Visit (HOSPITAL_BASED_OUTPATIENT_CLINIC_OR_DEPARTMENT_OTHER): Payer: Medicare Other | Admitting: Internal Medicine

## 2013-12-25 ENCOUNTER — Ambulatory Visit (HOSPITAL_BASED_OUTPATIENT_CLINIC_OR_DEPARTMENT_OTHER): Payer: Medicare Other

## 2013-12-25 VITALS — BP 129/56 | HR 70 | Temp 97.7°F | Resp 19 | Ht 72.0 in | Wt 169.3 lb

## 2013-12-25 DIAGNOSIS — R5381 Other malaise: Secondary | ICD-10-CM | POA: Diagnosis not present

## 2013-12-25 DIAGNOSIS — C3410 Malignant neoplasm of upper lobe, unspecified bronchus or lung: Secondary | ICD-10-CM

## 2013-12-25 DIAGNOSIS — C7970 Secondary malignant neoplasm of unspecified adrenal gland: Secondary | ICD-10-CM

## 2013-12-25 DIAGNOSIS — C3490 Malignant neoplasm of unspecified part of unspecified bronchus or lung: Secondary | ICD-10-CM

## 2013-12-25 DIAGNOSIS — R5383 Other fatigue: Secondary | ICD-10-CM

## 2013-12-25 DIAGNOSIS — K7689 Other specified diseases of liver: Secondary | ICD-10-CM | POA: Diagnosis not present

## 2013-12-25 DIAGNOSIS — Z5111 Encounter for antineoplastic chemotherapy: Secondary | ICD-10-CM | POA: Diagnosis not present

## 2013-12-25 LAB — CBC WITH DIFFERENTIAL/PLATELET
BASO%: 0.1 % (ref 0.0–2.0)
Basophils Absolute: 0 10*3/uL (ref 0.0–0.1)
EOS%: 0 % (ref 0.0–7.0)
Eosinophils Absolute: 0 10*3/uL (ref 0.0–0.5)
HEMATOCRIT: 33.3 % — AB (ref 38.4–49.9)
HGB: 12 g/dL — ABNORMAL LOW (ref 13.0–17.1)
LYMPH#: 1.2 10*3/uL (ref 0.9–3.3)
LYMPH%: 10.9 % — AB (ref 14.0–49.0)
MCH: 33.1 pg (ref 27.2–33.4)
MCHC: 36 g/dL (ref 32.0–36.0)
MCV: 92 fL (ref 79.3–98.0)
MONO#: 1.3 10*3/uL — ABNORMAL HIGH (ref 0.1–0.9)
MONO%: 11.3 % (ref 0.0–14.0)
NEUT#: 8.8 10*3/uL — ABNORMAL HIGH (ref 1.5–6.5)
NEUT%: 77.7 % — ABNORMAL HIGH (ref 39.0–75.0)
Platelets: 283 10*3/uL (ref 140–400)
RBC: 3.62 10*6/uL — ABNORMAL LOW (ref 4.20–5.82)
RDW: 19.1 % — ABNORMAL HIGH (ref 11.0–14.6)
WBC: 11.4 10*3/uL — ABNORMAL HIGH (ref 4.0–10.3)

## 2013-12-25 LAB — COMPREHENSIVE METABOLIC PANEL (CC13)
ALK PHOS: 78 U/L (ref 40–150)
ALT: 12 U/L (ref 0–55)
AST: 17 U/L (ref 5–34)
Albumin: 3.9 g/dL (ref 3.5–5.0)
Anion Gap: 9 mEq/L (ref 3–11)
BILIRUBIN TOTAL: 0.48 mg/dL (ref 0.20–1.20)
BUN: 15.4 mg/dL (ref 7.0–26.0)
CHLORIDE: 96 meq/L — AB (ref 98–109)
CO2: 24 mEq/L (ref 22–29)
CREATININE: 0.7 mg/dL (ref 0.7–1.3)
Calcium: 9.6 mg/dL (ref 8.4–10.4)
Glucose: 105 mg/dl (ref 70–140)
Potassium: 4.2 mEq/L (ref 3.5–5.1)
Sodium: 129 mEq/L — ABNORMAL LOW (ref 136–145)
Total Protein: 6.5 g/dL (ref 6.4–8.3)

## 2013-12-25 MED ORDER — SODIUM CHLORIDE 0.9 % IV SOLN
500.0000 mg/m2 | Freq: Once | INTRAVENOUS | Status: AC
  Administered 2013-12-25: 950 mg via INTRAVENOUS
  Filled 2013-12-25: qty 38

## 2013-12-25 MED ORDER — DEXAMETHASONE SODIUM PHOSPHATE 20 MG/5ML IJ SOLN
20.0000 mg | Freq: Once | INTRAMUSCULAR | Status: AC
  Administered 2013-12-25: 20 mg via INTRAVENOUS

## 2013-12-25 MED ORDER — SODIUM CHLORIDE 0.9 % IV SOLN
447.5000 mg | Freq: Once | INTRAVENOUS | Status: AC
  Administered 2013-12-25: 450 mg via INTRAVENOUS
  Filled 2013-12-25: qty 45

## 2013-12-25 MED ORDER — ONDANSETRON 16 MG/50ML IVPB (CHCC)
INTRAVENOUS | Status: AC
  Filled 2013-12-25: qty 16

## 2013-12-25 MED ORDER — SODIUM CHLORIDE 0.9 % IV SOLN
Freq: Once | INTRAVENOUS | Status: AC
  Administered 2013-12-25: 12:00:00 via INTRAVENOUS

## 2013-12-25 MED ORDER — ONDANSETRON 16 MG/50ML IVPB (CHCC)
16.0000 mg | Freq: Once | INTRAVENOUS | Status: AC
  Administered 2013-12-25: 16 mg via INTRAVENOUS

## 2013-12-25 MED ORDER — DEXAMETHASONE SODIUM PHOSPHATE 20 MG/5ML IJ SOLN
INTRAMUSCULAR | Status: AC
  Filled 2013-12-25: qty 5

## 2013-12-25 NOTE — Patient Instructions (Signed)
[facility] Discharge Instructions for Patients Receiving Chemotherapy  Today you received the following chemotherapy agents Alimta and Carboplatin.  To help prevent nausea and vomiting after your treatment, we encourage you to take your nausea medication as prescribed.   If you develop nausea and vomiting that is not controlled by your nausea medication, call the clinic.   BELOW ARE SYMPTOMS THAT SHOULD BE REPORTED IMMEDIATELY:  *FEVER GREATER THAN 100.5 F  *CHILLS WITH OR WITHOUT FEVER  NAUSEA AND VOMITING THAT IS NOT CONTROLLED WITH YOUR NAUSEA MEDICATION  *UNUSUAL SHORTNESS OF BREATH  *UNUSUAL BRUISING OR BLEEDING  TENDERNESS IN MOUTH AND THROAT WITH OR WITHOUT PRESENCE OF ULCERS  *URINARY PROBLEMS  *BOWEL PROBLEMS  UNUSUAL RASH Items with * indicate a potential emergency and should be followed up as soon as possible.  Feel free to call the clinic you have any questions or concerns. The clinic phone number is [phone].

## 2013-12-25 NOTE — Telephone Encounter (Signed)
gv adn printed appt sch3ed and avs for pt for Jan and Feb...sed added tx.

## 2013-12-25 NOTE — Progress Notes (Signed)
[facility] [phone]   Fax:(336) [place], [place] [phone]  DIAGNOSIS: Stage IV (T1 A., N0, M1 B.) non-small cell lung cancer, adenocarcinoma with negative EGFR mutation and negative ALK gene translocation diagnosed in October of 2014.  Molecular profile: Negative for RET, ALK, BRAF, MET, EGFR.                                 Positive for ERBB2 A775T, [id number], KRAS G13E, MAP2K1 D67N  (see full report).  PRIOR THERAPY: None.  CURRENT THERAPY: Systemic chemotherapy with carboplatin for AUC of 5 and Alimta 500 mg/M2 every 3 weeks, status post 3 cycles. First dose on 10/23/2013.  CHEMOTHERAPY INTENT: Palliative  CURRENT # OF CHEMOTHERAPY CYCLES: 4  CURRENT ANTIEMETICS: Zofran, Compazine and dexamethasone  CURRENT SMOKING STATUS: Former smoker  ORAL CHEMOTHERAPY AND CONSENT: None  CURRENT BISPHOSPHONATES USE: None  PAIN MANAGEMENT: 0/10  NARCOTICS INDUCED CONSTIPATION: None  LIVING WILL AND CODE STATUS: ?   INTERVAL HISTORY: Anthony Curry 73 y.o. male returns to the clinic today for followup visit accompanied by his wife. The patient is feeling fine today with no specific complaints except for mild fatigue a few days after his chemotherapy. He denied having any significant chest pain, shortness of breath, cough or hemoptysis. The patient denied having any weight loss or night sweats. He has no nausea or vomiting. He has no fever or chills. He tolerated the last cycle of his systemic chemotherapy with carboplatin and Alimta fairly well. He is here today to start cycle #4. He had repeat CT scan of the chest, abdomen and pelvis performed recently and he is here for evaluation and discussion of his scan results.  MEDICAL HISTORY: Past Medical History  Diagnosis Date  . Hypertension   . GERD (gastroesophageal reflux disease)   . Alcohol abuse   . 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 Low sodium     ALLERGIES:  has No Known Allergies.  MEDICATIONS:  Current Outpatient Prescriptions  Medication Sig Dispense Refill  . ALPRAZolam (XANAX) 0.25 MG tablet Take 1 tablet (0.25 mg total) by mouth 3 (three) times daily.  90 tablet  0  . amLODipine (NORVASC) 10 MG tablet Take 10 mg by mouth daily.       . Ascorbic Acid (VITAMIN C PO) Take 1 tablet by mouth daily.      [name] atenolol (TENORMIN) 100 MG tablet Take 100 mg by mouth daily.       . calcium carbonate (OS-CAL) 600 MG TABS Take 600 mg by mouth daily with breakfast.      . Cholecalciferol (VITAMIN D-3) 5000 UNITS TABS Take 5,000 Units by mouth daily.       [name] demeclocycline (DECLOMYCIN) 150 MG tablet Take 150 mg by mouth 2 (two) times daily.      [name] dexamethasone (DECADRON) 4 MG tablet 4 milligrams by mouth twice a day the day before, day of and day after the chemotherapy every 3 weeks.  40 tablet  1  . feeding supplement (ENSURE COMPLETE) LIQD Take 237 mLs by mouth daily.  30 Bottle  0  . folic acid (FOLVITE) 1 MG tablet Take 1 tablet (1 mg total) by mouth daily.  30 tablet  6  . HYDROcodone-acetaminophen (NORCO/VICODIN) 5-325 MG per tablet Take 1-2 tablets by mouth every 4 (  four) hours as needed.  30 tablet  0  . MAGNESIUM PO Take 1 tablet by mouth daily.      . Multiple Vitamin (MULTIVITAMIN WITH MINERALS) TABS Take 1 tablet by mouth daily.      [name] olmesartan (BENICAR) 40 MG tablet Take 40 mg by mouth daily.       [name] omeprazole (PRILOSEC) 20 MG capsule Take 20 mg by mouth daily.       . Potassium 99 MG TABS Take 1 tablet by mouth daily.      . tamsulosin (FLOMAX) 0.4 MG CAPS capsule Take 0.4 mg by mouth 2 (two) times daily.      [name] thiamine (VITAMIN B-1) 100 MG tablet Take 1 tablet (100 mg total) by mouth daily.  30 tablet  6  . vitamin E 400 UNIT capsule Take 400 Units by mouth daily.      . prochlorperazine (COMPAZINE) 10 MG tablet Take 1 tablet (10 mg total) by mouth every 6 (six) hours as needed  for nausea or vomiting.  60 tablet  0   No current facility-administered medications for this visit.    SURGICAL HISTORY:  Past Surgical History  Procedure Laterality Date  . Hemorrhoid surgery    . Ankle arthrodesis  1995    rt fx-hardware in  . Tonsillectomy    . Colonoscopy    . Excision / curettage bone cyst finger  2005    rt thumb  . Total shoulder arthroplasty  02/03/2012    Procedure: TOTAL SHOULDER ARTHROPLASTY;  Surgeon: [name], MD;  Location: [facility];  Service: Orthopedics;  Laterality: Left;    REVIEW OF SYSTEMS:  Constitutional: negative Eyes: negative Ears, nose, mouth, throat, and face: negative Respiratory: negative Cardiovascular: negative Gastrointestinal: negative Genitourinary:negative Integument/breast: negative Hematologic/lymphatic: negative Musculoskeletal:negative Neurological: negative Behavioral/Psych: negative Endocrine: negative Allergic/Immunologic: negative   PHYSICAL EXAMINATION: General appearance: alert, cooperative and no distress Head: Normocephalic, without obvious abnormality, atraumatic Neck: no adenopathy, no JVD, supple, symmetrical, trachea midline and thyroid not enlarged, symmetric, no tenderness/mass/nodules Lymph nodes: Cervical, supraclavicular, and axillary nodes normal. Resp: clear to auscultation bilaterally Back: symmetric, no curvature. ROM normal. No CVA tenderness. Cardio: regular rate and rhythm, S1, S2 normal, no murmur, click, rub or gallop GI: soft, non-tender; bowel sounds normal; no masses,  no organomegaly Extremities: extremities normal, atraumatic, no cyanosis or edema Neurologic: Alert and oriented X 3, normal strength and tone. Normal symmetric reflexes. Normal coordination and gait  ECOG PERFORMANCE STATUS: 1 - Symptomatic but completely ambulatory  Blood pressure 129/56, pulse 70, temperature 97.7 F (36.5 C), temperature source Oral, resp. rate 19, height 6' (1.829 m), weight  169 lb 4.8 oz (76.794 kg), SpO2 99.00%.  LABORATORY DATA: Lab Results  Component Value Date   WBC 11.4* 12/25/2013   HGB 12.0* 12/25/2013   HCT 33.3* 12/25/2013   MCV 92.0 12/25/2013   PLT 283 12/25/2013      Chemistry      Component Value Date/Time   NA 130* 12/18/2013 0946   NA 132* 09/13/2013 0816   K 4.6 12/18/2013 0946   K 3.8 09/13/2013 0816   CL 96 09/13/2013 0816   CO2 25 12/18/2013 0946   CO2 29 09/13/2013 0816   BUN 11.4 12/18/2013 0946   BUN 10 09/13/2013 0816   CREATININE 0.8 12/18/2013 0946   CREATININE 0.8 09/13/2013 0816      Component Value Date/Time   CALCIUM 9.5 12/18/2013 0946   CALCIUM 9.4 09/13/2013 0816  ALKPHOS 97 12/18/2013 0946   ALKPHOS 73 07/05/2013 0441   AST 19 12/18/2013 0946   AST 26 07/05/2013 0441   ALT 20 12/18/2013 0946   ALT 16 07/05/2013 0441   BILITOT 0.46 12/18/2013 0946   BILITOT 1.0 07/05/2013 0441       RADIOGRAPHIC STUDIES: Ct Chest W Contrast  12/23/2013   CLINICAL DATA:  Metastatic non-small-cell lung cancer on chemotherapy.  EXAM: CT CHEST, ABDOMEN, AND PELVIS WITH CONTRAST  TECHNIQUE: Multidetector CT imaging of the chest, abdomen and pelvis was performed following the standard protocol during bolus administration of intravenous contrast.  CONTRAST:  162m OMNIPAQUE IOHEXOL 300 MG/ML  SOLN  COMPARISON:  Chest CT 08/12/2013.  PET-CT 08/27/2013.  FINDINGS: CT CHEST FINDINGS  Mediastinum: Heart size is normal. There is no significant pericardial fluid, thickening or pericardial calcification. There is atherosclerosis of the thoracic aorta, the great vessels of the mediastinum and the coronary arteries, including calcified atherosclerotic plaque in the left main, left anterior descending and right coronary arteries. No pathologically enlarged mediastinal or hilar lymph nodes. Esophagus is unremarkable in appearance.  Lungs/Pleura: Previously noted spiculated left apical nodule (image 12 of series 5) is similar in size, currently measuring 16 x 8 mm.  Previously noted ill-defined ground-glass attenuation nodule with internal cystic changes in the medial aspect of the left upper lobe is similar in size to the prior study measuring 31 x 20 mm (image 17 of series 5). 2 cm right upper lobe ground-glass attenuation nodule is unchanged (image 20 of series 5). Spiculated nodule in the anterior aspect of the left upper lobe measuring 15 x 10 mm (image 25 of series 5) is unchanged. Several other nodular appearing foci of ground-glass attenuation are again noted scattered throughout the lungs bilaterally, and also appear similar to prior studies. No acute consolidative airspace disease. Large pleural based calcification in the posterior aspect of the lower left hemithorax is favored to represent a calcified granuloma. No significant pleural effusions. Background of mild diffuse bronchial wall thickening with mild centrilobular and paraseptal emphysema.  Musculoskeletal: Multiple old healed bilateral rib fractures. Postoperative changes of left shoulder arthroplasty. 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 There are no aggressive appearing lytic or blastic lesions noted in the visualized portions of the skeleton.  CT ABDOMEN AND PELVIS FINDINGS  Abdomen/Pelvis: Numerous calcified gallstones layer dependently in the gallbladder. There are multiple sub cm low-attenuation lesions in the liver which are too small to definitively characterize. One of these measuring 5 mm adjacent to the gallbladder fossa is unchanged, while small lesion in the inferior aspect of segment 6 of the liver (image 78 of series 2 and in the superior aspect of segment 2 of the liver (image 59 of series 2) are not confidently identified on prior examinations. Tiny hypervascular focus measuring 5 mm in segment 4A of the liver is new, but incompletely characterized. The appearance of the  pancreas, spleen, right adrenal gland and bilateral kidneys is unremarkable. Some mild bilateral perinephric stranding is similar to prior studies and is nonspecific. In the left adrenal gland there is again a 2 cm nodule corresponding to the biopsy-proven metastasis.  No significant volume of ascites. No pneumoperitoneum. No pathologic distention of small bowel. No definite lymphadenopathy identified within the abdomen or pelvis. Urinary bladder is moderately distended, with multifocal mural irregularity, with multiple small bladder diverticulae, similar to prior examinations. Prostate gland is unremarkable in appearance.  Musculoskeletal: Multilevel degenerative disc disease. Lucent lesion with ill-defined margins in the right ilium (image 101 of series 2) appears similar to recent head examination (at which time there was no corresponding hypermetabolic focus). Small bone islands in the left ischium and left ilium are unchanged.  IMPRESSION: 1. Overall, today's study demonstrates generally stable disease, with similar size of numerous solid-appearing and ground-glass attenuation nodules in the lungs, as well as a similar left adrenal metastasis. No definite new sites of metastasis are confidently identified. 2. There are some new subcentimeter liver lesions, 2 of which are low attenuation and favored to represent tiny cysts, and the other which appears hypervascular, favored to represent a tiny perfusion anomaly. These are all nonspecific, and technically speaking, metastatic disease is not excluded, however, it is not favored at this time. These could be definitively characterized with MRI of the liver with and without IV gadolinium if clinically indicated. Alternatively, these could simply be followed on routine surveillance scans. 3. Additional incidental findings, as above, similar prior studies.   Electronically Signed   By: Vinnie Langton M.D.   On: 12/23/2013 10:01   ASSESSMENT AND PLAN: This is a very  pleasant 72 years old white male recently diagnosed with metastatic non-small cell lung cancer, adenocarcinoma with negative EGFR mutation and negative ALK gene translocation. He is currently on systemic chemotherapy with carboplatin and Alimta status post 3 cycles. His recent CT scan of the chest, abdomen and pelvis showed no evidence for disease progression but there were some new subcentimeter liver lesions questionable for cysts but metastatic disease could not be completely excluded.  I discussed the scan results with the patient and his wife.  I recommended for him to proceed with cycle #4 today as scheduled and I will continue to monitor the tiny liver lesion on the upcoming scan after cycle #6.  The patient come back for followup visit in 3 weeks with the next cycle of his chemotherapy.  He was advised to call immediately if she has any concerning symptoms in the interval. All questions were answered.  The patient knows to call the clinic with any problems, questions or concerns. We can certainly see the patient much sooner if necessary. I spent 15 minutes of face-to-face counseling with the patient and his wife about the total visit time 25 minutes.  Disclaimer: This note was dictated with voice recognition software. Similar sounding words can inadvertently be transcribed and may not be corrected upon review.

## 2013-12-25 NOTE — Patient Instructions (Signed)
CURRENT THERAPY: Systemic chemotherapy with carboplatin for AUC of 5 and Alimta 500 mg/M2 every 3 weeks, status post 3 cycles. First dose on 10/23/2013.  CHEMOTHERAPY INTENT: Palliative  CURRENT # OF CHEMOTHERAPY CYCLES: 4  CURRENT ANTIEMETICS: Zofran, Compazine and dexamethasone  CURRENT SMOKING STATUS: Former smoker  ORAL CHEMOTHERAPY AND CONSENT: None  CURRENT BISPHOSPHONATES USE: None  PAIN MANAGEMENT: 0/10  NARCOTICS INDUCED CONSTIPATION: None  LIVING WILL AND CODE STATUS: ?

## 2013-12-26 ENCOUNTER — Telehealth: Payer: Self-pay | Admitting: Pulmonary Disease

## 2013-12-26 NOTE — Telephone Encounter (Signed)
Called both numbers NA and no option to leave a msg, [facility]

## 2013-12-26 NOTE — Telephone Encounter (Signed)
Spoke with the pt and notified of recs per [facility] He verbalized understanding and states nothing further needed

## 2013-12-26 NOTE — Telephone Encounter (Signed)
Let him know that asbestos exposure does increase risk of getting lung cancer, but there are none of the typical changes that I see with asbestos lung disease on the cat scan.

## 2013-12-26 NOTE — Telephone Encounter (Signed)
Spoke with pt. 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 He has seen on the new were some ppl exposed to asbestos were related to dx of lung cancer. Pt wants to know if this could be possible for him as well? Please advise [place] thanks

## 2013-12-30 ENCOUNTER — Telehealth: Payer: Self-pay | Admitting: *Deleted

## 2013-12-30 NOTE — Telephone Encounter (Signed)
Pt's wife called stating pt has been having intermittent headaches x 3 days.  No dizziness, trouble balancing, vision or changes.  Per Dr Vista Mink, Advised pt take tylenol prn to see if this helps, if not they can call back and we can arrange a scan of the head.  SLJ

## 2014-01-01 ENCOUNTER — Other Ambulatory Visit (HOSPITAL_BASED_OUTPATIENT_CLINIC_OR_DEPARTMENT_OTHER): Payer: Medicare Other

## 2014-01-01 DIAGNOSIS — C3410 Malignant neoplasm of upper lobe, unspecified bronchus or lung: Secondary | ICD-10-CM

## 2014-01-01 DIAGNOSIS — C7970 Secondary malignant neoplasm of unspecified adrenal gland: Secondary | ICD-10-CM

## 2014-01-01 LAB — COMPREHENSIVE METABOLIC PANEL (CC13)
ALBUMIN: 3.9 g/dL (ref 3.5–5.0)
ALT: 21 U/L (ref 0–55)
ANION GAP: 8 meq/L (ref 3–11)
AST: 22 U/L (ref 5–34)
Alkaline Phosphatase: 75 U/L (ref 40–150)
BUN: 12.7 mg/dL (ref 7.0–26.0)
CALCIUM: 9.4 mg/dL (ref 8.4–10.4)
CHLORIDE: 94 meq/L — AB (ref 98–109)
CO2: 27 mEq/L (ref 22–29)
Creatinine: 0.7 mg/dL (ref 0.7–1.3)
Glucose: 104 mg/dl (ref 70–140)
POTASSIUM: 4.2 meq/L (ref 3.5–5.1)
SODIUM: 130 meq/L — AB (ref 136–145)
TOTAL PROTEIN: 6.4 g/dL (ref 6.4–8.3)
Total Bilirubin: 0.93 mg/dL (ref 0.20–1.20)

## 2014-01-01 LAB — CBC WITH DIFFERENTIAL/PLATELET
BASO%: 0.6 % (ref 0.0–2.0)
Basophils Absolute: 0 10*3/uL (ref 0.0–0.1)
EOS%: 2.7 % (ref 0.0–7.0)
Eosinophils Absolute: 0.1 10*3/uL (ref 0.0–0.5)
HCT: 33.5 % — ABNORMAL LOW (ref 38.4–49.9)
HGB: 11.9 g/dL — ABNORMAL LOW (ref 13.0–17.1)
LYMPH#: 1.1 10*3/uL (ref 0.9–3.3)
LYMPH%: 36.4 % (ref 14.0–49.0)
MCH: 34.8 pg — ABNORMAL HIGH (ref 27.2–33.4)
MCHC: 35.4 g/dL (ref 32.0–36.0)
MCV: 98.1 fL — ABNORMAL HIGH (ref 79.3–98.0)
MONO#: 0.5 10*3/uL (ref 0.1–0.9)
MONO%: 15.4 % — ABNORMAL HIGH (ref 0.0–14.0)
NEUT#: 1.3 10*3/uL — ABNORMAL LOW (ref 1.5–6.5)
NEUT%: 44.9 % (ref 39.0–75.0)
Platelets: 176 10*3/uL (ref 140–400)
RBC: 3.41 10*6/uL — ABNORMAL LOW (ref 4.20–5.82)
RDW: 20 % — ABNORMAL HIGH (ref 11.0–14.6)
WBC: 3 10*3/uL — AB (ref 4.0–10.3)

## 2014-01-08 ENCOUNTER — Other Ambulatory Visit (HOSPITAL_BASED_OUTPATIENT_CLINIC_OR_DEPARTMENT_OTHER): Payer: Medicare Other

## 2014-01-08 DIAGNOSIS — C3410 Malignant neoplasm of upper lobe, unspecified bronchus or lung: Secondary | ICD-10-CM

## 2014-01-08 LAB — CBC WITH DIFFERENTIAL/PLATELET
BASO%: 0.5 % (ref 0.0–2.0)
Basophils Absolute: 0 10*3/uL (ref 0.0–0.1)
EOS%: 0.9 % (ref 0.0–7.0)
Eosinophils Absolute: 0 10*3/uL (ref 0.0–0.5)
HCT: 33.1 % — ABNORMAL LOW (ref 38.4–49.9)
HGB: 11.7 g/dL — ABNORMAL LOW (ref 13.0–17.1)
LYMPH#: 1.1 10*3/uL (ref 0.9–3.3)
LYMPH%: 30.6 % (ref 14.0–49.0)
MCH: 35 pg — AB (ref 27.2–33.4)
MCHC: 35.3 g/dL (ref 32.0–36.0)
MCV: 99.3 fL — ABNORMAL HIGH (ref 79.3–98.0)
MONO#: 0.6 10*3/uL (ref 0.1–0.9)
MONO%: 17.6 % — ABNORMAL HIGH (ref 0.0–14.0)
NEUT#: 1.7 10*3/uL (ref 1.5–6.5)
NEUT%: 50.4 % (ref 39.0–75.0)
Platelets: 143 10*3/uL (ref 140–400)
RBC: 3.34 10*6/uL — AB (ref 4.20–5.82)
RDW: 20.1 % — AB (ref 11.0–14.6)
WBC: 3.5 10*3/uL — ABNORMAL LOW (ref 4.0–10.3)

## 2014-01-08 LAB — COMPREHENSIVE METABOLIC PANEL (CC13)
ALT: 22 U/L (ref 0–55)
ANION GAP: 7 meq/L (ref 3–11)
AST: 23 U/L (ref 5–34)
Albumin: 4 g/dL (ref 3.5–5.0)
Alkaline Phosphatase: 71 U/L (ref 40–150)
BILIRUBIN TOTAL: 0.82 mg/dL (ref 0.20–1.20)
BUN: 9.8 mg/dL (ref 7.0–26.0)
CO2: 28 meq/L (ref 22–29)
CREATININE: 0.8 mg/dL (ref 0.7–1.3)
Calcium: 9.8 mg/dL (ref 8.4–10.4)
Chloride: 96 mEq/L — ABNORMAL LOW (ref 98–109)
GLUCOSE: 88 mg/dL (ref 70–140)
Potassium: 4.5 mEq/L (ref 3.5–5.1)
SODIUM: 130 meq/L — AB (ref 136–145)
TOTAL PROTEIN: 6.3 g/dL — AB (ref 6.4–8.3)

## 2014-01-15 ENCOUNTER — Telehealth: Payer: Self-pay | Admitting: Internal Medicine

## 2014-01-15 ENCOUNTER — Ambulatory Visit (HOSPITAL_BASED_OUTPATIENT_CLINIC_OR_DEPARTMENT_OTHER): Payer: Medicare Other

## 2014-01-15 ENCOUNTER — Encounter: Payer: Self-pay | Admitting: Physician Assistant

## 2014-01-15 ENCOUNTER — Encounter: Payer: Self-pay | Admitting: Internal Medicine

## 2014-01-15 ENCOUNTER — Other Ambulatory Visit (HOSPITAL_BASED_OUTPATIENT_CLINIC_OR_DEPARTMENT_OTHER): Payer: Medicare Other

## 2014-01-15 ENCOUNTER — Ambulatory Visit (HOSPITAL_BASED_OUTPATIENT_CLINIC_OR_DEPARTMENT_OTHER): Payer: Medicare Other | Admitting: Physician Assistant

## 2014-01-15 ENCOUNTER — Other Ambulatory Visit: Payer: Self-pay | Admitting: *Deleted

## 2014-01-15 VITALS — BP 131/61 | HR 67 | Temp 97.7°F | Resp 18 | Ht 72.0 in | Wt 169.8 lb

## 2014-01-15 DIAGNOSIS — C3490 Malignant neoplasm of unspecified part of unspecified bronchus or lung: Secondary | ICD-10-CM

## 2014-01-15 DIAGNOSIS — C3410 Malignant neoplasm of upper lobe, unspecified bronchus or lung: Secondary | ICD-10-CM

## 2014-01-15 DIAGNOSIS — C7970 Secondary malignant neoplasm of unspecified adrenal gland: Secondary | ICD-10-CM

## 2014-01-15 DIAGNOSIS — Z5111 Encounter for antineoplastic chemotherapy: Secondary | ICD-10-CM | POA: Diagnosis not present

## 2014-01-15 LAB — CBC WITH DIFFERENTIAL/PLATELET
BASO%: 0.3 % (ref 0.0–2.0)
Basophils Absolute: 0 10*3/uL (ref 0.0–0.1)
EOS ABS: 0 10*3/uL (ref 0.0–0.5)
EOS%: 0 % (ref 0.0–7.0)
HCT: 33 % — ABNORMAL LOW (ref 38.4–49.9)
HGB: 12 g/dL — ABNORMAL LOW (ref 13.0–17.1)
LYMPH#: 0.6 10*3/uL — AB (ref 0.9–3.3)
LYMPH%: 14.9 % (ref 14.0–49.0)
MCH: 34.4 pg — ABNORMAL HIGH (ref 27.2–33.4)
MCHC: 36.4 g/dL — AB (ref 32.0–36.0)
MCV: 94.6 fL (ref 79.3–98.0)
MONO#: 0.1 10*3/uL (ref 0.1–0.9)
MONO%: 2.7 % (ref 0.0–14.0)
NEUT%: 82.1 % — AB (ref 39.0–75.0)
NEUTROS ABS: 3 10*3/uL (ref 1.5–6.5)
Platelets: 138 10*3/uL — ABNORMAL LOW (ref 140–400)
RBC: 3.49 10*6/uL — AB (ref 4.20–5.82)
RDW: 17.3 % — ABNORMAL HIGH (ref 11.0–14.6)
WBC: 3.7 10*3/uL — ABNORMAL LOW (ref 4.0–10.3)
nRBC: 0 % (ref 0–0)

## 2014-01-15 LAB — COMPREHENSIVE METABOLIC PANEL (CC13)
ALT: 16 U/L (ref 0–55)
AST: 21 U/L (ref 5–34)
Albumin: 4.3 g/dL (ref 3.5–5.0)
Alkaline Phosphatase: 78 U/L (ref 40–150)
Anion Gap: 10 mEq/L (ref 3–11)
BUN: 11.2 mg/dL (ref 7.0–26.0)
CO2: 22 mEq/L (ref 22–29)
Calcium: 9.9 mg/dL (ref 8.4–10.4)
Chloride: 96 mEq/L — ABNORMAL LOW (ref 98–109)
Creatinine: 0.7 mg/dL (ref 0.7–1.3)
Glucose: 127 mg/dl (ref 70–140)
POTASSIUM: 4.2 meq/L (ref 3.5–5.1)
Sodium: 128 mEq/L — ABNORMAL LOW (ref 136–145)
Total Bilirubin: 1.07 mg/dL (ref 0.20–1.20)
Total Protein: 6.9 g/dL (ref 6.4–8.3)

## 2014-01-15 MED ORDER — ONDANSETRON 16 MG/50ML IVPB (CHCC)
INTRAVENOUS | Status: AC
  Filled 2014-01-15: qty 16

## 2014-01-15 MED ORDER — SODIUM CHLORIDE 0.9 % IV SOLN
Freq: Once | INTRAVENOUS | Status: AC
  Administered 2014-01-15: 12:00:00 via INTRAVENOUS

## 2014-01-15 MED ORDER — CARBOPLATIN CHEMO INJECTION 600 MG/60ML
447.5000 mg | Freq: Once | INTRAVENOUS | Status: AC
  Administered 2014-01-15: 450 mg via INTRAVENOUS
  Filled 2014-01-15: qty 45

## 2014-01-15 MED ORDER — ONDANSETRON 16 MG/50ML IVPB (CHCC)
16.0000 mg | Freq: Once | INTRAVENOUS | Status: AC
  Administered 2014-01-15: 16 mg via INTRAVENOUS

## 2014-01-15 MED ORDER — DEXAMETHASONE SODIUM PHOSPHATE 20 MG/5ML IJ SOLN
INTRAMUSCULAR | Status: AC
  Filled 2014-01-15: qty 5

## 2014-01-15 MED ORDER — HYDROCODONE-ACETAMINOPHEN 5-325 MG PO TABS: 1.0000 | ORAL_TABLET | ORAL | Status: DC | PRN

## 2014-01-15 MED ORDER — DEXAMETHASONE SODIUM PHOSPHATE 20 MG/5ML IJ SOLN
20.0000 mg | Freq: Once | INTRAMUSCULAR | Status: AC
  Administered 2014-01-15: 20 mg via INTRAVENOUS

## 2014-01-15 MED ORDER — SODIUM CHLORIDE 0.9 % IV SOLN
500.0000 mg/m2 | Freq: Once | INTRAVENOUS | Status: AC
  Administered 2014-01-15: 950 mg via INTRAVENOUS
  Filled 2014-01-15: qty 38

## 2014-01-15 NOTE — Telephone Encounter (Signed)
gv adn printed appt scehd and avs forpt for Feb and March

## 2014-01-15 NOTE — Patient Instructions (Signed)
[facility] Discharge Instructions for Patients Receiving Chemotherapy  Today you received the following chemotherapy agents ALIMTA,CARBOPLATIN To help prevent nausea and vomiting after your treatment, we encourage you to take your nausea medication as prescribed.   If you develop nausea and vomiting that is not controlled by your nausea medication, call the clinic.   BELOW ARE SYMPTOMS THAT SHOULD BE REPORTED IMMEDIATELY:  *FEVER GREATER THAN 100.5 F  *CHILLS WITH OR WITHOUT FEVER  NAUSEA AND VOMITING THAT IS NOT CONTROLLED WITH YOUR NAUSEA MEDICATION  *UNUSUAL SHORTNESS OF BREATH  *UNUSUAL BRUISING OR BLEEDING  TENDERNESS IN MOUTH AND THROAT WITH OR WITHOUT PRESENCE OF ULCERS  *URINARY PROBLEMS  *BOWEL PROBLEMS  UNUSUAL RASH Items with * indicate a potential emergency and should be followed up as soon as possible.  Feel free to call the clinic you have any questions or concerns. The clinic phone number is [phone].

## 2014-01-15 NOTE — Progress Notes (Addendum)
[facility] Telephone:([phone]   Fax:(336) [place], [place] [address]  DIAGNOSIS: Stage IV (T1 A., N0, M1 B.) non-small cell lung cancer, adenocarcinoma with negative EGFR mutation and negative ALK gene translocation diagnosed in October of 2014.  Molecular profile: Negative for RET, ALK, BRAF, MET, EGFR.                                 Positive for ERBB2 A775T, [id number], KRAS G13E, MAP2K1 D67N  (see full report).  PRIOR THERAPY: None.  CURRENT THERAPY: Systemic chemotherapy with carboplatin for AUC of 5 and Alimta 500 mg/M2 every 3 weeks, status post 4 cycles. First dose on 10/23/2013.  CHEMOTHERAPY INTENT: Palliative  CURRENT # OF CHEMOTHERAPY CYCLES: 5  CURRENT ANTIEMETICS: Zofran, Compazine and dexamethasone  CURRENT SMOKING STATUS: Former smoker  ORAL CHEMOTHERAPY AND CONSENT: None  CURRENT BISPHOSPHONATES USE: None  PAIN MANAGEMENT: 0/10  NARCOTICS INDUCED CONSTIPATION: None  LIVING WILL AND CODE STATUS: ?   INTERVAL HISTORY: Anthony Curry 73 y.o. male returns to the clinic today for followup visit accompanied by his wife. The patient is feeling fine today with no specific complaints except for mild fatigue a few days after his chemotherapy. He also complains of pain primarily affecting his shoulders. He requests a refill for his pain medication. He denied having any significant chest pain, shortness of breath, cough or hemoptysis. The patient denied having any weight loss or night sweats. He has no nausea or vomiting. He has no fever or chills. He tolerated the last cycle of his systemic chemotherapy with carboplatin and Alimta fairly well. He is here today to start cycle #5. His wife and daughter would like him to take months vacation in [place] this late spring or early summer. They are hoping to adjust his chemotherapy accordingly.  MEDICAL  HISTORY: Past Medical History  Diagnosis Date  . Hypertension   . GERD (gastroesophageal reflux disease)   . Alcohol abuse   . 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 Low sodium     ALLERGIES:  has No Known Allergies.  MEDICATIONS:  Current Outpatient Prescriptions  Medication Sig Dispense Refill  . ALPRAZolam (XANAX) 0.25 MG tablet Take 1 tablet (0.25 mg total) by mouth 3 (three) times daily.  90 tablet  0  . amLODipine (NORVASC) 10 MG tablet Take 10 mg by mouth daily.       . Ascorbic Acid (VITAMIN C PO) Take 1 tablet by mouth daily.      [name] atenolol (TENORMIN) 100 MG tablet Take 100 mg by mouth daily.       . calcium carbonate (OS-CAL) 600 MG TABS Take 600 mg by mouth daily with breakfast.      . Cholecalciferol (VITAMIN D-3) 5000 UNITS TABS Take 5,000 Units by mouth daily.       [name] demeclocycline (DECLOMYCIN) 150 MG tablet Take 150 mg by mouth 2 (two) times daily.      [name] dexamethasone (DECADRON) 4 MG tablet 4 milligrams by mouth twice a day the day before, day of and day after the chemotherapy every 3 weeks.  40 tablet  1  . feeding supplement (ENSURE COMPLETE) LIQD Take 237 mLs by mouth daily.  30 Bottle  0  . folic acid (FOLVITE) 1 MG tablet Take 1 tablet (1 mg total) by mouth daily.  30 tablet  6  . HYDROcodone-acetaminophen (NORCO/VICODIN) 5-325 MG per tablet Take 1-2 tablets by mouth every 4 (four) hours as needed.  30 tablet  0  . MAGNESIUM PO Take 1 tablet by mouth daily.      . Multiple Vitamin (MULTIVITAMIN WITH MINERALS) TABS Take 1 tablet by mouth daily.      [name] olmesartan (BENICAR) 40 MG tablet Take 40 mg by mouth daily.       [name] omeprazole (PRILOSEC) 20 MG capsule Take 20 mg by mouth daily.       . Potassium 99 MG TABS Take 1 tablet by mouth daily.      . tamsulosin (FLOMAX) 0.4 MG CAPS capsule Take 0.4 mg by mouth 2 (two) times daily.      [name] thiamine (VITAMIN B-1) 100 MG tablet Take 1 tablet (100 mg total) by mouth daily.  30 tablet  6  . vitamin E 400 UNIT capsule  Take 400 Units by mouth daily.      . prochlorperazine (COMPAZINE) 10 MG tablet Take 1 tablet (10 mg total) by mouth every 6 (six) hours as needed for nausea or vomiting.  60 tablet  0   No current facility-administered medications for this visit.    SURGICAL HISTORY:  Past Surgical History  Procedure Laterality Date  . Hemorrhoid surgery    . Ankle arthrodesis  1995    rt fx-hardware in  . Tonsillectomy    . Colonoscopy    . Excision / curettage bone cyst finger  2005    rt thumb  . Total shoulder arthroplasty  02/03/2012    Procedure: TOTAL SHOULDER ARTHROPLASTY;  Surgeon: [name], MD;  Location: [facility];  Service: Orthopedics;  Laterality: Left;    REVIEW OF SYSTEMS:  Constitutional: negative Eyes: negative Ears, nose, mouth, throat, and face: negative Respiratory: negative Cardiovascular: negative Gastrointestinal: negative Genitourinary:negative Integument/breast: negative Hematologic/lymphatic: negative Musculoskeletal:positive for arthralgias Neurological: negative Behavioral/Psych: negative Endocrine: negative Allergic/Immunologic: negative   PHYSICAL EXAMINATION: General appearance: alert, cooperative and no distress Head: Normocephalic, without obvious abnormality, atraumatic Neck: no adenopathy, no JVD, supple, symmetrical, trachea midline and thyroid not enlarged, symmetric, no tenderness/mass/nodules Lymph nodes: Cervical, supraclavicular, and axillary nodes normal. Resp: clear to auscultation bilaterally Back: symmetric, no curvature. ROM normal. No CVA tenderness. Cardio: regular rate and rhythm, S1, S2 normal, no murmur, click, rub or gallop GI: soft, non-tender; bowel sounds normal; no masses,  no organomegaly Extremities: extremities normal, atraumatic, no cyanosis or edema Neurologic: Alert and oriented X 3, normal strength and tone. Normal symmetric reflexes. Normal coordination and gait  ECOG PERFORMANCE STATUS: 1 -  Symptomatic but completely ambulatory  Blood pressure 131/61, pulse 67, temperature 97.7 F (36.5 C), temperature source Oral, resp. rate 18, height 6' (1.829 m), weight 169 lb 12.8 oz (77.021 kg), SpO2 100.00%.  LABORATORY DATA: Lab Results  Component Value Date   WBC 3.7* 01/15/2014   HGB 12.0* 01/15/2014   HCT 33.0* 01/15/2014   MCV 94.6 01/15/2014   PLT 138* 01/15/2014      Chemistry      Component Value Date/Time   NA 128* 01/15/2014 1009   NA 132* 09/13/2013 0816   K 4.2 01/15/2014 1009   K 3.8 09/13/2013 0816   CL 96 09/13/2013 0816   CO2 22 01/15/2014 1009   CO2 29 09/13/2013 0816   BUN 11.2 01/15/2014 1009   BUN 10 09/13/2013 0816   CREATININE 0.7 01/15/2014 1009   CREATININE 0.8 09/13/2013 0816  Component Value Date/Time   CALCIUM 9.9 01/15/2014 1009   CALCIUM 9.4 09/13/2013 0816   ALKPHOS 78 01/15/2014 1009   ALKPHOS 73 07/05/2013 0441   AST 21 01/15/2014 1009   AST 26 07/05/2013 0441   ALT 16 01/15/2014 1009   ALT 16 07/05/2013 0441   BILITOT 1.07 01/15/2014 1009   BILITOT 1.0 07/05/2013 0441       RADIOGRAPHIC STUDIES: Ct Chest W Contrast  12/23/2013   CLINICAL DATA:  Metastatic non-small-cell lung cancer on chemotherapy.  EXAM: CT CHEST, ABDOMEN, AND PELVIS WITH CONTRAST  TECHNIQUE: Multidetector CT imaging of the chest, abdomen and pelvis was performed following the standard protocol during bolus administration of intravenous contrast.  CONTRAST:  110m OMNIPAQUE IOHEXOL 300 MG/ML  SOLN  COMPARISON:  Chest CT 08/12/2013.  PET-CT 08/27/2013.  FINDINGS: CT CHEST FINDINGS  Mediastinum: Heart size is normal. There is no significant pericardial fluid, thickening or pericardial calcification. There is atherosclerosis of the thoracic aorta, the great vessels of the mediastinum and the coronary arteries, including calcified atherosclerotic plaque in the left main, left anterior descending and right coronary arteries. No pathologically enlarged mediastinal or hilar lymph nodes.  Esophagus is unremarkable in appearance.  Lungs/Pleura: Previously noted spiculated left apical nodule (image 12 of series 5) is similar in size, currently measuring 16 x 8 mm. Previously noted ill-defined ground-glass attenuation nodule with internal cystic changes in the medial aspect of the left upper lobe is similar in size to the prior study measuring 31 x 20 mm (image 17 of series 5). 2 cm right upper lobe ground-glass attenuation nodule is unchanged (image 20 of series 5). Spiculated nodule in the anterior aspect of the left upper lobe measuring 15 x 10 mm (image 25 of series 5) is unchanged. Several other nodular appearing foci of ground-glass attenuation are again noted scattered throughout the lungs bilaterally, and also appear similar to prior studies. No acute consolidative airspace disease. Large pleural based calcification in the posterior aspect of the lower left hemithorax is favored to represent a calcified granuloma. No significant pleural effusions. Background of mild diffuse bronchial wall thickening with mild centrilobular and paraseptal emphysema.  Musculoskeletal: Multiple old healed bilateral rib fractures. Postoperative changes of left shoulder arthroplasty. 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 There are no aggressive appearing lytic or blastic lesions noted in the visualized portions of the skeleton.  CT ABDOMEN AND PELVIS FINDINGS  Abdomen/Pelvis: Numerous calcified gallstones layer dependently in the gallbladder. There are multiple sub cm low-attenuation lesions in the liver which are too small to definitively characterize. One of these measuring 5 mm adjacent to the gallbladder fossa is unchanged, while small lesion in the inferior aspect of segment 6 of the liver (image 78 of series 2 and in the superior aspect of segment 2 of the liver (image 59 of series 2) are not  confidently identified on prior examinations. Tiny hypervascular focus measuring 5 mm in segment 4A of the liver is new, but incompletely characterized. The appearance of the pancreas, spleen, right adrenal gland and bilateral kidneys is unremarkable. Some mild bilateral perinephric stranding is similar to prior studies and is nonspecific. In the left adrenal gland there is again a 2 cm nodule corresponding to the biopsy-proven metastasis.  No significant volume of ascites. No pneumoperitoneum. No pathologic distention of small bowel. No definite lymphadenopathy identified within the abdomen or pelvis. Urinary bladder is moderately distended, with multifocal mural  irregularity, with multiple small bladder diverticulae, similar to prior examinations. Prostate gland is unremarkable in appearance.  Musculoskeletal: Multilevel degenerative disc disease. Lucent lesion with ill-defined margins in the right ilium (image 101 of series 2) appears similar to recent head examination (at which time there was no corresponding hypermetabolic focus). Small bone islands in the left ischium and left ilium are unchanged.  IMPRESSION: 1. Overall, today's study demonstrates generally stable disease, with similar size of numerous solid-appearing and ground-glass attenuation nodules in the lungs, as well as a similar left adrenal metastasis. No definite new sites of metastasis are confidently identified. 2. There are some new subcentimeter liver lesions, 2 of which are low attenuation and favored to represent tiny cysts, and the other which appears hypervascular, favored to represent a tiny perfusion anomaly. These are all nonspecific, and technically speaking, metastatic disease is not excluded, however, it is not favored at this time. These could be definitively characterized with MRI of the liver with and without IV gadolinium if clinically indicated. Alternatively, these could simply be followed on routine surveillance scans. 3.  Additional incidental findings, as above, similar prior studies.   Electronically Signed   By: Vinnie Langton M.D.   On: 12/23/2013 10:01   ASSESSMENT AND PLAN: This is a very pleasant 73 years old white male recently diagnosed with metastatic non-small cell lung cancer, adenocarcinoma with negative EGFR mutation and negative ALK gene translocation. He is currently on systemic chemotherapy with carboplatin and Alimta status post 4 cycles. His recent CT scan of the chest, abdomen and pelvis showed no evidence for disease progression but there were some new subcentimeter liver lesions questionable for cysts but metastatic disease could not be completely excluded. Patient was discussed with and also seen by Dr. [name]. He will proceed with cycle #5 of his systemic chemotherapy with carboplatin and Alimta today as scheduled. He will continue with weekly labs consisting of a CBC differential and C. met as scheduled. He'll return in 3 weeks prior to cycle #6 for another symptom management visit. We will pay close attention to the tiny liver lesion on the restaging CT scan after cycle #6. A refill prescription for his pain medication was given to patient. He was advised to call immediately if she has any concerning symptoms in the interval. All questions were answered.  The patient knows to call the clinic with any problems, questions or concerns. We can certainly see the patient much sooner if necessary.  Carlton Adam PA-C  ADDENDUM:  Hematology/Oncology Attending:  I had a face to face encounter with the patient. I recommended his care plan. This is a very pleasant 73 years old white male with metastatic non-small cell lung cancer, adenocarcinoma currently undergoing systemic chemotherapy with carboplatin and Alimta status post 4 cycles. He is tolerating his treatment fairly well with no significant adverse effects except for mild fatigue a few days after the treatment. I recommended for the patient  to proceed with cycle #5 today as scheduled. He would come back for followup visit in 3 weeks with the next cycle of his treatment. He was advised to call immediately if he has any concerning symptoms in the interval.  Disclaimer: This note was dictated with voice recognition software. Similar sounding words can inadvertently be transcribed and may not be corrected upon review. Eilleen Kempf., MD 01/19/2014

## 2014-01-17 NOTE — Patient Instructions (Signed)
Continue weekly labs as scheduled Followup in 3 weeks prior to your next scheduled cycle of chemotherapy

## 2014-01-22 ENCOUNTER — Other Ambulatory Visit (HOSPITAL_BASED_OUTPATIENT_CLINIC_OR_DEPARTMENT_OTHER): Payer: Medicare Other

## 2014-01-22 DIAGNOSIS — C3410 Malignant neoplasm of upper lobe, unspecified bronchus or lung: Secondary | ICD-10-CM | POA: Diagnosis not present

## 2014-01-22 LAB — COMPREHENSIVE METABOLIC PANEL (CC13)
ALK PHOS: 68 U/L (ref 40–150)
ALT: 20 U/L (ref 0–55)
AST: 22 U/L (ref 5–34)
Albumin: 4 g/dL (ref 3.5–5.0)
Anion Gap: 9 mEq/L (ref 3–11)
BUN: 15.5 mg/dL (ref 7.0–26.0)
CO2: 26 mEq/L (ref 22–29)
Calcium: 9.7 mg/dL (ref 8.4–10.4)
Chloride: 94 mEq/L — ABNORMAL LOW (ref 98–109)
Creatinine: 0.7 mg/dL (ref 0.7–1.3)
GLUCOSE: 100 mg/dL (ref 70–140)
Potassium: 4.5 mEq/L (ref 3.5–5.1)
SODIUM: 129 meq/L — AB (ref 136–145)
Total Bilirubin: 0.82 mg/dL (ref 0.20–1.20)
Total Protein: 6.2 g/dL — ABNORMAL LOW (ref 6.4–8.3)

## 2014-01-22 LAB — CBC WITH DIFFERENTIAL/PLATELET
BASO%: 0.3 % (ref 0.0–2.0)
BASOS ABS: 0 10*3/uL (ref 0.0–0.1)
EOS ABS: 0.1 10*3/uL (ref 0.0–0.5)
EOS%: 3.6 % (ref 0.0–7.0)
HCT: 32.7 % — ABNORMAL LOW (ref 38.4–49.9)
HGB: 11.3 g/dL — ABNORMAL LOW (ref 13.0–17.1)
LYMPH%: 54.6 % — ABNORMAL HIGH (ref 14.0–49.0)
MCH: 35.7 pg — ABNORMAL HIGH (ref 27.2–33.4)
MCHC: 34.6 g/dL (ref 32.0–36.0)
MCV: 103.1 fL — AB (ref 79.3–98.0)
MONO#: 0.2 10*3/uL (ref 0.1–0.9)
MONO%: 8.5 % (ref 0.0–14.0)
NEUT%: 33 % — ABNORMAL LOW (ref 39.0–75.0)
NEUTROS ABS: 0.7 10*3/uL — AB (ref 1.5–6.5)
PLATELETS: 137 10*3/uL — AB (ref 140–400)
RBC: 3.17 10*6/uL — AB (ref 4.20–5.82)
RDW: 17 % — ABNORMAL HIGH (ref 11.0–14.6)
WBC: 2.2 10*3/uL — ABNORMAL LOW (ref 4.0–10.3)
lymph#: 1.2 10*3/uL (ref 0.9–3.3)

## 2014-01-29 ENCOUNTER — Other Ambulatory Visit (HOSPITAL_BASED_OUTPATIENT_CLINIC_OR_DEPARTMENT_OTHER): Payer: Medicare Other

## 2014-01-29 DIAGNOSIS — C3410 Malignant neoplasm of upper lobe, unspecified bronchus or lung: Secondary | ICD-10-CM

## 2014-01-29 LAB — CBC WITH DIFFERENTIAL/PLATELET
BASO%: 0.2 % (ref 0.0–2.0)
Basophils Absolute: 0 10*3/uL (ref 0.0–0.1)
EOS%: 1.3 % (ref 0.0–7.0)
Eosinophils Absolute: 0 10*3/uL (ref 0.0–0.5)
HEMATOCRIT: 32.2 % — AB (ref 38.4–49.9)
HGB: 11.3 g/dL — ABNORMAL LOW (ref 13.0–17.1)
LYMPH%: 52.9 % — AB (ref 14.0–49.0)
MCH: 35.8 pg — ABNORMAL HIGH (ref 27.2–33.4)
MCHC: 34.9 g/dL (ref 32.0–36.0)
MCV: 102.5 fL — ABNORMAL HIGH (ref 79.3–98.0)
MONO#: 0.4 10*3/uL (ref 0.1–0.9)
MONO%: 15 % — ABNORMAL HIGH (ref 0.0–14.0)
NEUT%: 30.6 % — ABNORMAL LOW (ref 39.0–75.0)
NEUTROS ABS: 0.7 10*3/uL — AB (ref 1.5–6.5)
PLATELETS: 144 10*3/uL (ref 140–400)
RBC: 3.14 10*6/uL — ABNORMAL LOW (ref 4.20–5.82)
RDW: 16 % — ABNORMAL HIGH (ref 11.0–14.6)
WBC: 2.4 10*3/uL — AB (ref 4.0–10.3)
lymph#: 1.3 10*3/uL (ref 0.9–3.3)

## 2014-01-29 LAB — COMPREHENSIVE METABOLIC PANEL (CC13)
ALT: 22 U/L (ref 0–55)
ANION GAP: 9 meq/L (ref 3–11)
AST: 21 U/L (ref 5–34)
Albumin: 3.8 g/dL (ref 3.5–5.0)
Alkaline Phosphatase: 72 U/L (ref 40–150)
BILIRUBIN TOTAL: 0.72 mg/dL (ref 0.20–1.20)
BUN: 14.4 mg/dL (ref 7.0–26.0)
CALCIUM: 9.3 mg/dL (ref 8.4–10.4)
CO2: 26 mEq/L (ref 22–29)
Chloride: 98 mEq/L (ref 98–109)
Creatinine: 0.8 mg/dL (ref 0.7–1.3)
GLUCOSE: 100 mg/dL (ref 70–140)
Potassium: 4.5 mEq/L (ref 3.5–5.1)
SODIUM: 133 meq/L — AB (ref 136–145)
TOTAL PROTEIN: 6.3 g/dL — AB (ref 6.4–8.3)

## 2014-02-05 ENCOUNTER — Ambulatory Visit (HOSPITAL_BASED_OUTPATIENT_CLINIC_OR_DEPARTMENT_OTHER): Payer: Medicare Other

## 2014-02-05 ENCOUNTER — Other Ambulatory Visit (HOSPITAL_BASED_OUTPATIENT_CLINIC_OR_DEPARTMENT_OTHER): Payer: Medicare Other

## 2014-02-05 ENCOUNTER — Encounter: Payer: Self-pay | Admitting: Internal Medicine

## 2014-02-05 ENCOUNTER — Telehealth: Payer: Self-pay | Admitting: Internal Medicine

## 2014-02-05 ENCOUNTER — Ambulatory Visit (HOSPITAL_BASED_OUTPATIENT_CLINIC_OR_DEPARTMENT_OTHER): Payer: Medicare Other | Admitting: Internal Medicine

## 2014-02-05 VITALS — BP 126/70 | HR 70 | Temp 97.1°F | Resp 18 | Ht 72.0 in | Wt 167.1 lb

## 2014-02-05 DIAGNOSIS — C3490 Malignant neoplasm of unspecified part of unspecified bronchus or lung: Secondary | ICD-10-CM

## 2014-02-05 DIAGNOSIS — C3410 Malignant neoplasm of upper lobe, unspecified bronchus or lung: Secondary | ICD-10-CM

## 2014-02-05 DIAGNOSIS — Z5111 Encounter for antineoplastic chemotherapy: Secondary | ICD-10-CM | POA: Diagnosis not present

## 2014-02-05 DIAGNOSIS — C7970 Secondary malignant neoplasm of unspecified adrenal gland: Secondary | ICD-10-CM

## 2014-02-05 LAB — CBC WITH DIFFERENTIAL/PLATELET
BASO%: 0 % (ref 0.0–2.0)
Basophils Absolute: 0 10*3/uL (ref 0.0–0.1)
EOS%: 0 % (ref 0.0–7.0)
Eosinophils Absolute: 0 10*3/uL (ref 0.0–0.5)
HEMATOCRIT: 30.5 % — AB (ref 38.4–49.9)
HGB: 11.2 g/dL — ABNORMAL LOW (ref 13.0–17.1)
LYMPH#: 0.4 10*3/uL — AB (ref 0.9–3.3)
LYMPH%: 17.9 % (ref 14.0–49.0)
MCH: 35.3 pg — ABNORMAL HIGH (ref 27.2–33.4)
MCHC: 36.7 g/dL — AB (ref 32.0–36.0)
MCV: 96.2 fL (ref 79.3–98.0)
MONO#: 0.1 10*3/uL (ref 0.1–0.9)
MONO%: 5.8 % (ref 0.0–14.0)
NEUT#: 1.8 10*3/uL (ref 1.5–6.5)
NEUT%: 76.3 % — AB (ref 39.0–75.0)
NRBC: 0 % (ref 0–0)
Platelets: 201 10*3/uL (ref 140–400)
RBC: 3.17 10*6/uL — ABNORMAL LOW (ref 4.20–5.82)
RDW: 16 % — AB (ref 11.0–14.6)
WBC: 2.4 10*3/uL — AB (ref 4.0–10.3)

## 2014-02-05 LAB — COMPREHENSIVE METABOLIC PANEL (CC13)
ALK PHOS: 74 U/L (ref 40–150)
ALT: 16 U/L (ref 0–55)
ANION GAP: 11 meq/L (ref 3–11)
AST: 20 U/L (ref 5–34)
Albumin: 4.3 g/dL (ref 3.5–5.0)
BILIRUBIN TOTAL: 0.74 mg/dL (ref 0.20–1.20)
BUN: 12.6 mg/dL (ref 7.0–26.0)
CO2: 24 meq/L (ref 22–29)
Calcium: 10.1 mg/dL (ref 8.4–10.4)
Chloride: 95 mEq/L — ABNORMAL LOW (ref 98–109)
Creatinine: 0.8 mg/dL (ref 0.7–1.3)
Glucose: 122 mg/dl (ref 70–140)
Potassium: 4.2 mEq/L (ref 3.5–5.1)
SODIUM: 130 meq/L — AB (ref 136–145)
TOTAL PROTEIN: 7.3 g/dL (ref 6.4–8.3)

## 2014-02-05 MED ORDER — CYANOCOBALAMIN 1000 MCG/ML IJ SOLN
1000.0000 ug | Freq: Once | INTRAMUSCULAR | Status: AC
  Administered 2014-02-05: 1000 ug via INTRAMUSCULAR

## 2014-02-05 MED ORDER — SODIUM CHLORIDE 0.9 % IV SOLN
400.0000 mg/m2 | Freq: Once | INTRAVENOUS | Status: AC
  Administered 2014-02-05: 750 mg via INTRAVENOUS
  Filled 2014-02-05: qty 30

## 2014-02-05 MED ORDER — ONDANSETRON 16 MG/50ML IVPB (CHCC)
16.0000 mg | Freq: Once | INTRAVENOUS | Status: AC
  Administered 2014-02-05: 16 mg via INTRAVENOUS

## 2014-02-05 MED ORDER — SODIUM CHLORIDE 0.9 % IV SOLN
358.0000 mg | Freq: Once | INTRAVENOUS | Status: AC
  Administered 2014-02-05: 360 mg via INTRAVENOUS
  Filled 2014-02-05: qty 36

## 2014-02-05 MED ORDER — SODIUM CHLORIDE 0.9 % IV SOLN
Freq: Once | INTRAVENOUS | Status: AC
  Administered 2014-02-05: 11:00:00 via INTRAVENOUS

## 2014-02-05 MED ORDER — DEXAMETHASONE SODIUM PHOSPHATE 20 MG/5ML IJ SOLN
20.0000 mg | Freq: Once | INTRAMUSCULAR | Status: AC
  Administered 2014-02-05: 20 mg via INTRAVENOUS

## 2014-02-05 MED ORDER — CYANOCOBALAMIN 1000 MCG/ML IJ SOLN
INTRAMUSCULAR | Status: AC
  Filled 2014-02-05: qty 1

## 2014-02-05 MED ORDER — ONDANSETRON 16 MG/50ML IVPB (CHCC)
INTRAVENOUS | Status: AC
  Filled 2014-02-05: qty 16

## 2014-02-05 MED ORDER — DEXAMETHASONE SODIUM PHOSPHATE 20 MG/5ML IJ SOLN
INTRAMUSCULAR | Status: AC
  Filled 2014-02-05: qty 5

## 2014-02-05 NOTE — Patient Instructions (Signed)
[facility] Discharge Instructions for Patients Receiving Chemotherapy  Today you received the following chemotherapy agents: Alimta and Carboplatin   To help prevent nausea and vomiting after your treatment, we encourage you to take your nausea medication as prescribed.    If you develop nausea and vomiting that is not controlled by your nausea medication, call the clinic.   BELOW ARE SYMPTOMS THAT SHOULD BE REPORTED IMMEDIATELY:  *FEVER GREATER THAN 100.5 F  *CHILLS WITH OR WITHOUT FEVER  NAUSEA AND VOMITING THAT IS NOT CONTROLLED WITH YOUR NAUSEA MEDICATION  *UNUSUAL SHORTNESS OF BREATH  *UNUSUAL BRUISING OR BLEEDING  TENDERNESS IN MOUTH AND THROAT WITH OR WITHOUT PRESENCE OF ULCERS  *URINARY PROBLEMS  *BOWEL PROBLEMS  UNUSUAL RASH Items with * indicate a potential emergency and should be followed up as soon as possible.  Feel free to call the clinic you have any questions or concerns. The clinic phone number is [phone].

## 2014-02-05 NOTE — Progress Notes (Signed)
[place] Telephone:([phone]   Fax:(336) [place], [place] [address]  DIAGNOSIS: Stage IV (T1 A., N0, M1 B.) non-small cell lung cancer, adenocarcinoma with negative EGFR mutation and negative ALK gene translocation diagnosed in October of 2014.  Molecular profile: Negative for RET, ALK, BRAF, MET, EGFR.                                 Positive for ERBB2 A775T, [id number], KRAS G13E, MAP2K1 D67N  (see full report).  PRIOR THERAPY: None.  CURRENT THERAPY: Systemic chemotherapy with carboplatin for AUC of 5 and Alimta 500 mg/M2 every 3 weeks, status post 5 cycles. First dose on 10/23/2013.  CHEMOTHERAPY INTENT: Palliative  CURRENT # OF CHEMOTHERAPY CYCLES: 6  CURRENT ANTIEMETICS: Zofran, Compazine and dexamethasone  CURRENT SMOKING STATUS: Former smoker  ORAL CHEMOTHERAPY AND CONSENT: None  CURRENT BISPHOSPHONATES USE: None  PAIN MANAGEMENT: 0/10  NARCOTICS INDUCED CONSTIPATION: None  LIVING WILL AND CODE STATUS: ?   INTERVAL HISTORY: Anthony Curry 73 y.o. male returns to the clinic today for followup visit accompanied by his wife. The patient is feeling fine today with no specific complaints except for mild fatigue a few days after his chemotherapy. He denied having any significant chest pain, shortness of breath, cough or hemoptysis. The patient denied having any weight loss or night sweats. He has no nausea or vomiting. He has no fever or chills. He tolerated the last cycle of his systemic chemotherapy with carboplatin and Alimta fairly well. He is here today to start cycle #6.   MEDICAL HISTORY: Past Medical History  Diagnosis Date  . Hypertension   . GERD (gastroesophageal reflux disease)   . Alcohol abuse   . 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 Low sodium     ALLERGIES:  has No Known Allergies.  MEDICATIONS:  Current Outpatient Prescriptions    Medication Sig Dispense Refill  . ALPRAZolam (XANAX) 0.25 MG tablet Take 1 tablet (0.25 mg total) by mouth 3 (three) times daily.  90 tablet  0  . amLODipine (NORVASC) 10 MG tablet Take 10 mg by mouth daily.       . Ascorbic Acid (VITAMIN C PO) Take 1 tablet by mouth daily.      [name] atenolol (TENORMIN) 100 MG tablet Take 100 mg by mouth daily.       . calcium carbonate (OS-CAL) 600 MG TABS Take 600 mg by mouth daily with breakfast.      . Cholecalciferol (VITAMIN D-3) 5000 UNITS TABS Take 5,000 Units by mouth daily.       [name] demeclocycline (DECLOMYCIN) 150 MG tablet Take 150 mg by mouth 2 (two) times daily.      [name] dexamethasone (DECADRON) 4 MG tablet 4 milligrams by mouth twice a day the day before, day of and day after the chemotherapy every 3 weeks.  40 tablet  1  . feeding supplement (ENSURE COMPLETE) LIQD Take 237 mLs by mouth daily.  30 Bottle  0  . folic acid (FOLVITE) 1 MG tablet Take 1 tablet (1 mg total) by mouth daily.  30 tablet  6  . HYDROcodone-acetaminophen (NORCO/VICODIN) 5-325 MG per tablet Take 1-2 tablets by mouth every 4 (four) hours as needed.  30 tablet  0  . MAGNESIUM PO Take 1 tablet by mouth daily.      [name]  Multiple Vitamin (MULTIVITAMIN WITH MINERALS) TABS Take 1 tablet by mouth daily.      [name] olmesartan (BENICAR) 40 MG tablet Take 40 mg by mouth daily.       [name] omeprazole (PRILOSEC) 20 MG capsule Take 20 mg by mouth daily.       . Potassium 99 MG TABS Take 1 tablet by mouth daily.      . prochlorperazine (COMPAZINE) 10 MG tablet Take 1 tablet (10 mg total) by mouth every 6 (six) hours as needed for nausea or vomiting.  60 tablet  0  . tamsulosin (FLOMAX) 0.4 MG CAPS capsule Take 0.4 mg by mouth 2 (two) times daily.      [name] thiamine (VITAMIN B-1) 100 MG tablet Take 1 tablet (100 mg total) by mouth daily.  30 tablet  6  . vitamin E 400 UNIT capsule Take 400 Units by mouth daily.       No current facility-administered medications for this visit.    SURGICAL HISTORY:  Past  Surgical History  Procedure Laterality Date  . Hemorrhoid surgery    . Ankle arthrodesis  1995    rt fx-hardware in  . Tonsillectomy    . Colonoscopy    . Excision / curettage bone cyst finger  2005    rt thumb  . Total shoulder arthroplasty  02/03/2012    Procedure: TOTAL SHOULDER ARTHROPLASTY;  Surgeon: [name], MD;  Location: [facility];  Service: Orthopedics;  Laterality: Left;    REVIEW OF SYSTEMS:  Constitutional: negative Eyes: negative Ears, nose, mouth, throat, and face: negative Respiratory: negative Cardiovascular: negative Gastrointestinal: negative Genitourinary:negative Integument/breast: negative Hematologic/lymphatic: negative Musculoskeletal:negative Neurological: negative Behavioral/Psych: negative Endocrine: negative Allergic/Immunologic: negative   PHYSICAL EXAMINATION: General appearance: alert, cooperative and no distress Head: Normocephalic, without obvious abnormality, atraumatic Neck: no adenopathy, no JVD, supple, symmetrical, trachea midline and thyroid not enlarged, symmetric, no tenderness/mass/nodules Lymph nodes: Cervical, supraclavicular, and axillary nodes normal. Resp: clear to auscultation bilaterally Back: symmetric, no curvature. ROM normal. No CVA tenderness. Cardio: regular rate and rhythm, S1, S2 normal, no murmur, click, rub or gallop GI: soft, non-tender; bowel sounds normal; no masses,  no organomegaly Extremities: extremities normal, atraumatic, no cyanosis or edema Neurologic: Alert and oriented X 3, normal strength and tone. Normal symmetric reflexes. Normal coordination and gait  ECOG PERFORMANCE STATUS: 1 - Symptomatic but completely ambulatory  Blood pressure 126/70, pulse 70, temperature 97.1 F (36.2 C), temperature source Oral, resp. rate 18, height 6' (1.829 m), weight 167 lb 1.6 oz (75.796 kg), SpO2 100.00%.  LABORATORY DATA: Lab Results  Component Value Date   WBC 2.4* 02/05/2014   HGB 11.2*  02/05/2014   HCT 30.5* 02/05/2014   MCV 96.2 02/05/2014   PLT 201 02/05/2014      Chemistry      Component Value Date/Time   NA 133* 01/29/2014 1028   NA 132* 09/13/2013 0816   K 4.5 01/29/2014 1028   K 3.8 09/13/2013 0816   CL 96 09/13/2013 0816   CO2 26 01/29/2014 1028   CO2 29 09/13/2013 0816   BUN 14.4 01/29/2014 1028   BUN 10 09/13/2013 0816   CREATININE 0.8 01/29/2014 1028   CREATININE 0.8 09/13/2013 0816      Component Value Date/Time   CALCIUM 9.3 01/29/2014 1028   CALCIUM 9.4 09/13/2013 0816   ALKPHOS 72 01/29/2014 1028   ALKPHOS 73 07/05/2013 0441   AST 21 01/29/2014 1028   AST 26 07/05/2013 0441  ALT 22 01/29/2014 1028   ALT 16 07/05/2013 0441   BILITOT 0.72 01/29/2014 1028   BILITOT 1.0 07/05/2013 0441       RADIOGRAPHIC STUDIES:  ASSESSMENT AND PLAN: This is a very pleasant 73 years old white male recently diagnosed with metastatic non-small cell lung cancer, adenocarcinoma with negative EGFR mutation and negative ALK gene translocation. He is currently on systemic chemotherapy with carboplatin and Alimta status post 5 cycles. The patient is tolerating his treatment fairly well with no significant adverse effects. I recommended for him to proceed with cycle #6 today as scheduled and I will continue to monitor the tiny liver lesion on the upcoming scan.  I will order CT scan of the chest, abdomen and pelvis to be performed in 3 weeks for reevaluation of his disease. He was advised to call immediately if she has any concerning symptoms in the interval. All questions were answered.  The patient knows to call the clinic with any problems, questions or concerns. We can certainly see the patient much sooner if necessary.  Disclaimer: This note was dictated with voice recognition software. Similar sounding words can inadvertently be transcribed and may not be corrected upon review.

## 2014-02-05 NOTE — Telephone Encounter (Signed)
gave pt appt for lab and MD , gave pt oral contrast for CT for MArch

## 2014-02-12 ENCOUNTER — Other Ambulatory Visit (HOSPITAL_BASED_OUTPATIENT_CLINIC_OR_DEPARTMENT_OTHER): Payer: Medicare Other

## 2014-02-12 ENCOUNTER — Other Ambulatory Visit: Payer: Self-pay | Admitting: *Deleted

## 2014-02-12 ENCOUNTER — Ambulatory Visit (HOSPITAL_BASED_OUTPATIENT_CLINIC_OR_DEPARTMENT_OTHER): Payer: Medicare Other

## 2014-02-12 DIAGNOSIS — Z5189 Encounter for other specified aftercare: Secondary | ICD-10-CM

## 2014-02-12 DIAGNOSIS — C3410 Malignant neoplasm of upper lobe, unspecified bronchus or lung: Secondary | ICD-10-CM | POA: Diagnosis not present

## 2014-02-12 DIAGNOSIS — C7970 Secondary malignant neoplasm of unspecified adrenal gland: Secondary | ICD-10-CM | POA: Diagnosis not present

## 2014-02-12 DIAGNOSIS — C3490 Malignant neoplasm of unspecified part of unspecified bronchus or lung: Secondary | ICD-10-CM

## 2014-02-12 LAB — COMPREHENSIVE METABOLIC PANEL (CC13)
ALT: 19 U/L (ref 0–55)
AST: 19 U/L (ref 5–34)
Albumin: 3.7 g/dL (ref 3.5–5.0)
Alkaline Phosphatase: 73 U/L (ref 40–150)
Anion Gap: 9 mEq/L (ref 3–11)
BILIRUBIN TOTAL: 0.67 mg/dL (ref 0.20–1.20)
BUN: 11.3 mg/dL (ref 7.0–26.0)
CO2: 26 mEq/L (ref 22–29)
CREATININE: 0.8 mg/dL (ref 0.7–1.3)
Calcium: 9.6 mg/dL (ref 8.4–10.4)
Chloride: 97 mEq/L — ABNORMAL LOW (ref 98–109)
Glucose: 98 mg/dl (ref 70–140)
POTASSIUM: 3.9 meq/L (ref 3.5–5.1)
Sodium: 133 mEq/L — ABNORMAL LOW (ref 136–145)
Total Protein: 6.2 g/dL — ABNORMAL LOW (ref 6.4–8.3)

## 2014-02-12 LAB — CBC WITH DIFFERENTIAL/PLATELET
BASO%: 0.6 % (ref 0.0–2.0)
Basophils Absolute: 0 10*3/uL (ref 0.0–0.1)
EOS%: 1.3 % (ref 0.0–7.0)
Eosinophils Absolute: 0 10*3/uL (ref 0.0–0.5)
HCT: 30.1 % — ABNORMAL LOW (ref 38.4–49.9)
HEMOGLOBIN: 10.6 g/dL — AB (ref 13.0–17.1)
LYMPH%: 65.4 % — ABNORMAL HIGH (ref 14.0–49.0)
MCH: 37.1 pg — AB (ref 27.2–33.4)
MCHC: 35.3 g/dL (ref 32.0–36.0)
MCV: 105.2 fL — AB (ref 79.3–98.0)
MONO#: 0.2 10*3/uL (ref 0.1–0.9)
MONO%: 14.6 % — AB (ref 0.0–14.0)
NEUT#: 0.3 10*3/uL — CL (ref 1.5–6.5)
NEUT%: 18.1 % — AB (ref 39.0–75.0)
Platelets: 175 10*3/uL (ref 140–400)
RBC: 2.86 10*6/uL — ABNORMAL LOW (ref 4.20–5.82)
RDW: 16.3 % — AB (ref 11.0–14.6)
WBC: 1.7 10*3/uL — ABNORMAL LOW (ref 4.0–10.3)
lymph#: 1.1 10*3/uL (ref 0.9–3.3)

## 2014-02-12 MED ORDER — TBO-FILGRASTIM 300 MCG/0.5ML ~~LOC~~ SOSY
300.0000 ug | PREFILLED_SYRINGE | Freq: Once | SUBCUTANEOUS | Status: DC
  Administered 2014-02-12: 300 ug via SUBCUTANEOUS
  Filled 2014-02-12: qty 0.5

## 2014-02-12 NOTE — Progress Notes (Signed)
ANC 0.3, WBC 1.7, neutropenic pxns given to pt, per Dr Vista Mink pt needs granix 343mcg x 2 days.  Pt is aware of appts.  SLJ

## 2014-02-13 ENCOUNTER — Ambulatory Visit (HOSPITAL_BASED_OUTPATIENT_CLINIC_OR_DEPARTMENT_OTHER): Payer: Medicare Other

## 2014-02-13 VITALS — BP 116/53 | HR 61 | Temp 97.7°F

## 2014-02-13 DIAGNOSIS — C3490 Malignant neoplasm of unspecified part of unspecified bronchus or lung: Secondary | ICD-10-CM

## 2014-02-13 DIAGNOSIS — C7970 Secondary malignant neoplasm of unspecified adrenal gland: Secondary | ICD-10-CM

## 2014-02-13 DIAGNOSIS — Z5189 Encounter for other specified aftercare: Secondary | ICD-10-CM

## 2014-02-13 DIAGNOSIS — C3410 Malignant neoplasm of upper lobe, unspecified bronchus or lung: Secondary | ICD-10-CM | POA: Diagnosis not present

## 2014-02-13 MED ORDER — TBO-FILGRASTIM 300 MCG/0.5ML ~~LOC~~ SOSY
300.0000 ug | PREFILLED_SYRINGE | Freq: Once | SUBCUTANEOUS | Status: AC
  Administered 2014-02-13: 300 ug via SUBCUTANEOUS
  Filled 2014-02-13: qty 0.5

## 2014-02-19 ENCOUNTER — Other Ambulatory Visit (HOSPITAL_BASED_OUTPATIENT_CLINIC_OR_DEPARTMENT_OTHER): Payer: Medicare Other

## 2014-02-19 DIAGNOSIS — C3410 Malignant neoplasm of upper lobe, unspecified bronchus or lung: Secondary | ICD-10-CM

## 2014-02-19 LAB — CBC WITH DIFFERENTIAL/PLATELET
BASO%: 0.2 % (ref 0.0–2.0)
BASOS ABS: 0 10*3/uL (ref 0.0–0.1)
EOS ABS: 0 10*3/uL (ref 0.0–0.5)
EOS%: 1.6 % (ref 0.0–7.0)
HEMATOCRIT: 30.5 % — AB (ref 38.4–49.9)
HEMOGLOBIN: 10.6 g/dL — AB (ref 13.0–17.1)
LYMPH%: 46.5 % (ref 14.0–49.0)
MCH: 37 pg — ABNORMAL HIGH (ref 27.2–33.4)
MCHC: 34.8 g/dL (ref 32.0–36.0)
MCV: 106.4 fL — ABNORMAL HIGH (ref 79.3–98.0)
MONO#: 0.5 10*3/uL (ref 0.1–0.9)
MONO%: 24.7 % — ABNORMAL HIGH (ref 0.0–14.0)
NEUT%: 27 % — ABNORMAL LOW (ref 39.0–75.0)
NEUTROS ABS: 0.6 10*3/uL — AB (ref 1.5–6.5)
PLATELETS: 147 10*3/uL (ref 140–400)
RBC: 2.86 10*6/uL — ABNORMAL LOW (ref 4.20–5.82)
RDW: 17 % — ABNORMAL HIGH (ref 11.0–14.6)
WBC: 2.1 10*3/uL — ABNORMAL LOW (ref 4.0–10.3)
lymph#: 1 10*3/uL (ref 0.9–3.3)

## 2014-02-19 LAB — COMPREHENSIVE METABOLIC PANEL (CC13)
ALBUMIN: 3.7 g/dL (ref 3.5–5.0)
ALK PHOS: 73 U/L (ref 40–150)
ALT: 19 U/L (ref 0–55)
AST: 20 U/L (ref 5–34)
Anion Gap: 7 mEq/L (ref 3–11)
BUN: 7.4 mg/dL (ref 7.0–26.0)
CO2: 27 mEq/L (ref 22–29)
Calcium: 9.6 mg/dL (ref 8.4–10.4)
Chloride: 100 mEq/L (ref 98–109)
Creatinine: 0.8 mg/dL (ref 0.7–1.3)
Glucose: 70 mg/dl (ref 70–140)
POTASSIUM: 3.9 meq/L (ref 3.5–5.1)
Sodium: 134 mEq/L — ABNORMAL LOW (ref 136–145)
TOTAL PROTEIN: 6.2 g/dL — AB (ref 6.4–8.3)
Total Bilirubin: 0.52 mg/dL (ref 0.20–1.20)

## 2014-02-21 ENCOUNTER — Telehealth: Payer: Self-pay | Admitting: Medical Oncology

## 2014-02-21 ENCOUNTER — Other Ambulatory Visit: Payer: Self-pay | Admitting: Medical Oncology

## 2014-02-21 DIAGNOSIS — C3490 Malignant neoplasm of unspecified part of unspecified bronchus or lung: Secondary | ICD-10-CM

## 2014-02-21 NOTE — Telephone Encounter (Signed)
Per Adrena I reiterated the warm compresses and tylenol and to keep appointment for Ct scan on Monday. If he is very worried he should contact contact his PCP today  for further recommendations.

## 2014-02-21 NOTE — Telephone Encounter (Signed)
Wife called and reported pt is very worried about a new lump he found  behind right nipple , soreness with palpation. She said when she looks at his right breast she can see the lump.  Wife denies  redness warmth or nipple drainage at this area. i instructed wife to apply warm compress and pt may take tylenol for the discomfort. Pt just completed 6 cycle of chemo.

## 2014-02-24 ENCOUNTER — Ambulatory Visit (HOSPITAL_COMMUNITY)
Admission: RE | Admit: 2014-02-24 | Discharge: 2014-02-24 | Disposition: A | Discharge status: Home / Self Care | Payer: Medicare Other | Source: Ambulatory Visit | Attending: Internal Medicine | Admitting: Internal Medicine

## 2014-02-24 ENCOUNTER — Other Ambulatory Visit (HOSPITAL_BASED_OUTPATIENT_CLINIC_OR_DEPARTMENT_OTHER): Payer: Medicare Other

## 2014-02-24 DIAGNOSIS — C7970 Secondary malignant neoplasm of unspecified adrenal gland: Secondary | ICD-10-CM | POA: Insufficient documentation

## 2014-02-24 DIAGNOSIS — J438 Other emphysema: Secondary | ICD-10-CM | POA: Insufficient documentation

## 2014-02-24 DIAGNOSIS — K8020 Calculus of gallbladder without cholecystitis without obstruction: Secondary | ICD-10-CM | POA: Diagnosis not present

## 2014-02-24 DIAGNOSIS — K7689 Other specified diseases of liver: Secondary | ICD-10-CM | POA: Insufficient documentation

## 2014-02-24 DIAGNOSIS — C3410 Malignant neoplasm of upper lobe, unspecified bronchus or lung: Secondary | ICD-10-CM | POA: Diagnosis not present

## 2014-02-24 DIAGNOSIS — Z9221 Personal history of antineoplastic chemotherapy: Secondary | ICD-10-CM | POA: Diagnosis not present

## 2014-02-24 DIAGNOSIS — C3490 Malignant neoplasm of unspecified part of unspecified bronchus or lung: Secondary | ICD-10-CM

## 2014-02-24 LAB — CBC WITH DIFFERENTIAL/PLATELET
BASO%: 0.4 % (ref 0.0–2.0)
Basophils Absolute: 0 10*3/uL (ref 0.0–0.1)
EOS%: 3.3 % (ref 0.0–7.0)
Eosinophils Absolute: 0.1 10*3/uL (ref 0.0–0.5)
HCT: 32 % — ABNORMAL LOW (ref 38.4–49.9)
HGB: 11.1 g/dL — ABNORMAL LOW (ref 13.0–17.1)
LYMPH%: 43.8 % (ref 14.0–49.0)
MCH: 37.4 pg — ABNORMAL HIGH (ref 27.2–33.4)
MCHC: 34.7 g/dL (ref 32.0–36.0)
MCV: 107.8 fL — AB (ref 79.3–98.0)
MONO#: 0.4 10*3/uL (ref 0.1–0.9)
MONO%: 16.3 % — ABNORMAL HIGH (ref 0.0–14.0)
NEUT#: 0.8 10*3/uL — ABNORMAL LOW (ref 1.5–6.5)
NEUT%: 36.2 % — ABNORMAL LOW (ref 39.0–75.0)
PLATELETS: 198 10*3/uL (ref 140–400)
RBC: 2.96 10*6/uL — AB (ref 4.20–5.82)
RDW: 17.9 % — ABNORMAL HIGH (ref 11.0–14.6)
WBC: 2.2 10*3/uL — ABNORMAL LOW (ref 4.0–10.3)
lymph#: 1 10*3/uL (ref 0.9–3.3)

## 2014-02-24 LAB — COMPREHENSIVE METABOLIC PANEL (CC13)
ALT: 16 U/L (ref 0–55)
AST: 19 U/L (ref 5–34)
Albumin: 3.9 g/dL (ref 3.5–5.0)
Alkaline Phosphatase: 65 U/L (ref 40–150)
Anion Gap: 11 mEq/L (ref 3–11)
BILIRUBIN TOTAL: 0.61 mg/dL (ref 0.20–1.20)
BUN: 11.6 mg/dL (ref 7.0–26.0)
CO2: 26 mEq/L (ref 22–29)
Calcium: 9.7 mg/dL (ref 8.4–10.4)
Chloride: 98 mEq/L (ref 98–109)
Creatinine: 0.8 mg/dL (ref 0.7–1.3)
Glucose: 103 mg/dl (ref 70–140)
Potassium: 4.4 mEq/L (ref 3.5–5.1)
SODIUM: 134 meq/L — AB (ref 136–145)
TOTAL PROTEIN: 6.3 g/dL — AB (ref 6.4–8.3)

## 2014-02-24 MED ORDER — IOHEXOL 300 MG/ML  SOLN
100.0000 mL | Freq: Once | INTRAMUSCULAR | Status: AC | PRN
  Administered 2014-02-24: 100 mL via INTRAVENOUS

## 2014-02-26 ENCOUNTER — Telehealth: Payer: Self-pay | Admitting: Internal Medicine

## 2014-02-26 ENCOUNTER — Ambulatory Visit (HOSPITAL_BASED_OUTPATIENT_CLINIC_OR_DEPARTMENT_OTHER): Payer: Medicare Other | Admitting: Internal Medicine

## 2014-02-26 ENCOUNTER — Encounter: Payer: Self-pay | Admitting: Internal Medicine

## 2014-02-26 ENCOUNTER — Telehealth: Payer: Self-pay | Admitting: *Deleted

## 2014-02-26 VITALS — BP 122/57 | HR 58 | Temp 97.8°F | Resp 18 | Ht 72.0 in | Wt 166.7 lb

## 2014-02-26 DIAGNOSIS — C7970 Secondary malignant neoplasm of unspecified adrenal gland: Secondary | ICD-10-CM | POA: Diagnosis not present

## 2014-02-26 DIAGNOSIS — C3410 Malignant neoplasm of upper lobe, unspecified bronchus or lung: Secondary | ICD-10-CM | POA: Diagnosis not present

## 2014-02-26 DIAGNOSIS — C3490 Malignant neoplasm of unspecified part of unspecified bronchus or lung: Secondary | ICD-10-CM

## 2014-02-26 NOTE — Telephone Encounter (Signed)
Per staff message and POF I have scheduled appts.  JMW  

## 2014-02-26 NOTE — Progress Notes (Signed)
[place] Telephone:(336) [phone]   Fax:(336) [place], [place] [address]  DIAGNOSIS: Stage IV (T1 A., N0, M1 B.) non-small cell lung cancer, adenocarcinoma with negative EGFR mutation and negative ALK gene translocation diagnosed in October of 2014.  Molecular profile: Negative for RET, ALK, BRAF, MET, EGFR.                                 Positive for ERBB2 A775T, [id number], KRAS G13E, MAP2K1 D67N  (see full report).  PRIOR THERAPY: None.  CURRENT THERAPY: Systemic chemotherapy with carboplatin for AUC of 5 and Alimta 500 mg/M2 every 3 weeks, status post 6 cycles. First dose on 10/23/2013.  CHEMOTHERAPY INTENT: Palliative  CURRENT # OF CHEMOTHERAPY CYCLES: 6  CURRENT ANTIEMETICS: Zofran, Compazine and dexamethasone  CURRENT SMOKING STATUS: Former smoker  ORAL CHEMOTHERAPY AND CONSENT: None  CURRENT BISPHOSPHONATES USE: None  PAIN MANAGEMENT: 0/10  NARCOTICS INDUCED CONSTIPATION: None  LIVING WILL AND CODE STATUS: ?   INTERVAL HISTORY: Anthony Curry 73 y.o. male returns to the clinic today for followup visit accompanied by his wife and daughter. The patient is feeling fine today with no specific complaints except for mild fatigue a few days after his chemotherapy. He completed 6 cycle of his treatment with carboplatin and Alimta and tolerated it fairly well. He denied having any significant chest pain, shortness of breath, cough or hemoptysis. The patient denied having any weight loss or night sweats. He has no nausea or vomiting. He has no fever or chills. He has repeat CT scan of the chest, abdomen and pelvis performed recently and he is here for evaluation and discussion of his scan results.    MEDICAL HISTORY: Past Medical History  Diagnosis Date  . Hypertension   . GERD (gastroesophageal reflux disease)   . Alcohol abuse   . Osteoarthritis of left shoulder  02/03/2012  . Low sodium     ALLERGIES:  has No Known Allergies.  MEDICATIONS:  Current Outpatient Prescriptions  Medication Sig Dispense Refill  . ALPRAZolam (XANAX) 0.25 MG tablet Take 1 tablet (0.25 mg total) by mouth 3 (three) times daily.  90 tablet  0  . amLODipine (NORVASC) 10 MG tablet Take 10 mg by mouth daily.       . Ascorbic Acid (VITAMIN C PO) Take 1 tablet by mouth daily.      [name] atenolol (TENORMIN) 100 MG tablet Take 100 mg by mouth daily.       . calcium carbonate (OS-CAL) 600 MG TABS Take 600 mg by mouth daily with breakfast.      . Cholecalciferol (VITAMIN D-3) 5000 UNITS TABS Take 5,000 Units by mouth daily.       [name] demeclocycline (DECLOMYCIN) 150 MG tablet Take 150 mg by mouth 2 (two) times daily.      [name] dexamethasone (DECADRON) 4 MG tablet 4 milligrams by mouth twice a day the day before, day of and day after the chemotherapy every 3 weeks.  40 tablet  1  . feeding supplement (ENSURE COMPLETE) LIQD Take 237 mLs by mouth daily.  30 Bottle  0  . folic acid (FOLVITE) 1 MG tablet Take 1 tablet (1 mg total) by mouth daily.  30 tablet  6  . HYDROcodone-acetaminophen (NORCO/VICODIN) 5-325 MG per tablet Take 1-2 tablets by mouth every 4 (four) hours as  needed.  30 tablet  0  . MAGNESIUM PO Take 1 tablet by mouth daily.      . Multiple Vitamin (MULTIVITAMIN WITH MINERALS) TABS Take 1 tablet by mouth daily.      [name] olmesartan (BENICAR) 40 MG tablet Take 40 mg by mouth daily.       [name] omeprazole (PRILOSEC) 20 MG capsule Take 20 mg by mouth daily.       . Potassium 99 MG TABS Take 1 tablet by mouth daily.      . prochlorperazine (COMPAZINE) 10 MG tablet Take 1 tablet (10 mg total) by mouth every 6 (six) hours as needed for nausea or vomiting.  60 tablet  0  . tamsulosin (FLOMAX) 0.4 MG CAPS capsule Take 0.4 mg by mouth 2 (two) times daily.      [name] thiamine (VITAMIN B-1) 100 MG tablet Take 1 tablet (100 mg total) by mouth daily.  30 tablet  6  . vitamin E 400 UNIT capsule Take 400 Units  by mouth daily.       No current facility-administered medications for this visit.    SURGICAL HISTORY:  Past Surgical History  Procedure Laterality Date  . Hemorrhoid surgery    . Ankle arthrodesis  1995    rt fx-hardware in  . Tonsillectomy    . Colonoscopy    . Excision / curettage bone cyst finger  2005    rt thumb  . Total shoulder arthroplasty  02/03/2012    Procedure: TOTAL SHOULDER ARTHROPLASTY;  Surgeon: [name], MD;  Location: [facility];  Service: Orthopedics;  Laterality: Left;    REVIEW OF SYSTEMS:  Constitutional: negative Eyes: negative Ears, nose, mouth, throat, and face: negative Respiratory: negative Cardiovascular: negative Gastrointestinal: negative Genitourinary:negative Integument/breast: negative Hematologic/lymphatic: negative Musculoskeletal:negative Neurological: negative Behavioral/Psych: negative Endocrine: negative Allergic/Immunologic: negative   PHYSICAL EXAMINATION: General appearance: alert, cooperative and no distress Head: Normocephalic, without obvious abnormality, atraumatic Neck: no adenopathy, no JVD, supple, symmetrical, trachea midline and thyroid not enlarged, symmetric, no tenderness/mass/nodules Lymph nodes: Cervical, supraclavicular, and axillary nodes normal. Resp: clear to auscultation bilaterally Back: symmetric, no curvature. ROM normal. No CVA tenderness. Cardio: regular rate and rhythm, S1, S2 normal, no murmur, click, rub or gallop GI: soft, non-tender; bowel sounds normal; no masses,  no organomegaly Extremities: extremities normal, atraumatic, no cyanosis or edema Neurologic: Alert and oriented X 3, normal strength and tone. Normal symmetric reflexes. Normal coordination and gait  ECOG PERFORMANCE STATUS: 1 - Symptomatic but completely ambulatory  Blood pressure 122/57, pulse 58, temperature 97.8 F (36.6 C), temperature source Oral, resp. rate 18, height 6' (1.829 m), weight 166 lb 11.2 oz  (75.615 kg), SpO2 98.00%.  LABORATORY DATA: Lab Results  Component Value Date   WBC 2.2* 02/24/2014   HGB 11.1* 02/24/2014   HCT 32.0* 02/24/2014   MCV 107.8* 02/24/2014   PLT 198 02/24/2014      Chemistry      Component Value Date/Time   NA 134* 02/24/2014 0952   NA 132* 09/13/2013 0816   K 4.4 02/24/2014 0952   K 3.8 09/13/2013 0816   CL 96 09/13/2013 0816   CO2 26 02/24/2014 0952   CO2 29 09/13/2013 0816   BUN 11.6 02/24/2014 0952   BUN 10 09/13/2013 0816   CREATININE 0.8 02/24/2014 0952   CREATININE 0.8 09/13/2013 0816      Component Value Date/Time   CALCIUM 9.7 02/24/2014 0952   CALCIUM 9.4 09/13/2013 0816   ALKPHOS 65  02/24/2014 0952   ALKPHOS 73 07/05/2013 0441   AST 19 02/24/2014 0952   AST 26 07/05/2013 0441   ALT 16 02/24/2014 0952   ALT 16 07/05/2013 0441   BILITOT 0.61 02/24/2014 0952   BILITOT 1.0 07/05/2013 0441       RADIOGRAPHIC STUDIES: Ct Chest W Contrast  02/24/2014   CLINICAL DATA:  Restaging of lung cancer. Diagnosis 1/15. Ongoing chemotherapy.  EXAM: CT CHEST, ABDOMEN, AND PELVIS WITH CONTRAST  TECHNIQUE: Multidetector CT imaging of the chest, abdomen and pelvis was performed following the standard protocol during bolus administration of intravenous contrast.  CONTRAST:  137m OMNIPAQUE IOHEXOL 300 MG/ML  SOLN  COMPARISON:  CT ABD/PELVIS W CM dated 12/23/2013; CT ABDOMEN W/O CM dated 09/09/2013; NM PET IMAGE INITIAL (PI) SKULL BASE TO THIGH dated 08/27/2013  FINDINGS:   CT CHEST FINDINGS  Lungs/Pleura: Moderate centrilobular emphysema. Multiple ground-glass attenuation pulmonary nodules. Index posterior right upper lobe lesion measures 1.7 x 2.0 cm on image 21, similar to 2.0 x 1.9 cm on the prior exam.  Index spiculated medial left upper lobe nodule measures 1.7 x 1.0 cm on image 9 versus 1.6 x 0.8 cm on the prior exam. Felt to be similar.  Subjacent medial left upper lobe sub solid nodule is difficult to measure secondary to morphology but measures on the order of 3.1 x  2.1 cm today versus 3.1 x 2.0 cm on the prior.  Spiculated solid left upper lobe nodule measures 1.5 x 0.8 cm on image 21 versus 1.5 x 1.0 cm on the prior. This may be minimally improved.  No dominant new or enlarging nodule.  Trace left-sided pleural thickening which is unchanged.  Heart/Mediastinum: No supraclavicular adenopathy. Aortic and branch vessel atherosclerosis. Tortuous descending thoracic aorta. Mild cardiomegaly with coronary artery atherosclerosis. No pericardial effusion. No central pulmonary embolism, on this non-dedicated study. No mediastinal or hilar adenopathy. Tiny hiatal hernia. Fluid level in the distal thoracic esophagus.    CT ABDOMEN AND PELVIS FINDINGS  Abdomen/Pelvis: 8 mm hyperenhancing focus in the hepatic dome on image 55/series 2 is unchanged  A pericholecystic 5 mm low-density right liver lobe lesion on image 70 is also similar. Likely a cyst. Too small to characterize inferior right hepatic lobe 6 mm low-density lesion on image 75 is similar. A too small to characterize left liver lobe lesion on image 56 is also similar and likely a small cyst.  Normal spleen, stomach, pancreas. Cholelithiasis without acute cholecystitis or biliary ductal dilatation.  Normal right adrenal gland. A left adrenal nodule measures 2.0 x 1.9 cm on image 68/series 2. This is unchanged compared to 2.0 x 2.0 cm cm at the same level on the prior exam.  Mild scarred right kidney. No retroperitoneal or retrocrural adenopathy. Colonic stool burden suggests constipation. Normal terminal ileum. Normal small bowel without abdominal ascites. No evidence of omental or peritoneal disease. Bilateral tiny fat containing inguinal hernias. No pelvic adenopathy. Bladder wall irregularity with multiple saccules. Normal prostate, without significant free pelvic fluid.  Bones/Musculoskeletal: Left ischial bone island. Similar lucent lesion in the right iliac on image 97. Not hypermetabolic at prior PET.  Advanced right  glenohumeral joint osteoarthritis. Pectus excavatum deformity. Remote bilateral rib trauma. Left shoulder arthroplasty.    IMPRESSION: CT CHEST IMPRESSION  1. Overall similar to minimal improvement in appearance of the chest. The majority of soft tissue and ground-glass density nodules are similar. There may be minimal improvement in a left upper lobe soft tissue nodule. 2. No evidence  of new or progressive disease within the chest. 3. Esophageal air fluid level suggests dysmotility or gastroesophageal reflux.  CT ABDOMEN AND PELVIS IMPRESSION  1. Similar left adrenal metastasis. 2. No new or progressive disease within the abdomen/pelvis. 3. Liver lesions which are unchanged. Likely small cysts and a hepatic dome perfusion anomaly or flash fill hemangioma. 4. Cholelithiasis. 5. Findings of bladder outlet obstruction. 6.  Possible constipation.   Electronically Signed   By: Abigail Miyamoto M.D.   On: 02/24/2014 13:49   ASSESSMENT AND PLAN: This is a very pleasant 73 years old white male recently diagnosed with metastatic non-small cell lung cancer, adenocarcinoma with negative EGFR mutation and negative ALK gene translocation. He is currently on systemic chemotherapy with carboplatin and Alimta status post 6 cycles. His recent CT scan of the chest, abdomen and pelvis showed stable disease with minimal improvement. I discussed the scan results and showed the images to the patient and his family today. I discussed with the patient several options for his treatment including maintenance chemotherapy with single agent Alimta 500 mg/M2 every 3 weeks versus observation and close monitoring. The patient is interested in proceeding with the maintenance therapy with Alimta. His absolute neutrophil count is low today. I will give the patient a break for the next 2 weeks followed by reevaluation with repeat CBC and starting maintenance chemotherapy with single agent Alimta if his total absolute neutrophil count is good  enough for treatment. The patient and his family agreed to the current plan. He was advised to call immediately if she has any concerning symptoms in the interval. All questions were answered.  The patient knows to call the clinic with any problems, questions or concerns. We can certainly see the patient much sooner if necessary. I spent 15 minutes of face-to-face counseling with the patient and his family out of the total visit time 25 minutes.  Disclaimer: This note was dictated with voice recognition software. Similar sounding words can inadvertently be transcribed and may not be corrected upon review.

## 2014-02-26 NOTE — Telephone Encounter (Signed)
, °

## 2014-03-06 ENCOUNTER — Ambulatory Visit: Payer: Medicare Other | Admitting: Internal Medicine

## 2014-03-12 ENCOUNTER — Telehealth: Payer: Self-pay | Admitting: Internal Medicine

## 2014-03-12 ENCOUNTER — Other Ambulatory Visit (HOSPITAL_BASED_OUTPATIENT_CLINIC_OR_DEPARTMENT_OTHER): Payer: Medicare Other

## 2014-03-12 ENCOUNTER — Telehealth: Payer: Self-pay | Admitting: *Deleted

## 2014-03-12 ENCOUNTER — Ambulatory Visit (HOSPITAL_BASED_OUTPATIENT_CLINIC_OR_DEPARTMENT_OTHER): Payer: Medicare Other

## 2014-03-12 ENCOUNTER — Ambulatory Visit (HOSPITAL_BASED_OUTPATIENT_CLINIC_OR_DEPARTMENT_OTHER): Payer: Medicare Other | Admitting: Physician Assistant

## 2014-03-12 ENCOUNTER — Encounter: Payer: Self-pay | Admitting: Physician Assistant

## 2014-03-12 ENCOUNTER — Other Ambulatory Visit: Payer: Self-pay

## 2014-03-12 VITALS — BP 122/57 | HR 67 | Temp 97.9°F | Resp 18 | Ht 72.0 in | Wt 169.6 lb

## 2014-03-12 DIAGNOSIS — C3490 Malignant neoplasm of unspecified part of unspecified bronchus or lung: Secondary | ICD-10-CM

## 2014-03-12 DIAGNOSIS — C7970 Secondary malignant neoplasm of unspecified adrenal gland: Secondary | ICD-10-CM

## 2014-03-12 DIAGNOSIS — C3410 Malignant neoplasm of upper lobe, unspecified bronchus or lung: Secondary | ICD-10-CM

## 2014-03-12 DIAGNOSIS — Z5111 Encounter for antineoplastic chemotherapy: Secondary | ICD-10-CM | POA: Diagnosis not present

## 2014-03-12 LAB — COMPREHENSIVE METABOLIC PANEL (CC13)
ALT: 11 U/L (ref 0–55)
ANION GAP: 9 meq/L (ref 3–11)
AST: 26 U/L (ref 5–34)
Albumin: 4.2 g/dL (ref 3.5–5.0)
Alkaline Phosphatase: 74 U/L (ref 40–150)
BILIRUBIN TOTAL: 0.79 mg/dL (ref 0.20–1.20)
BUN: 9.7 mg/dL (ref 7.0–26.0)
CO2: 24 mEq/L (ref 22–29)
CREATININE: 0.8 mg/dL (ref 0.7–1.3)
Calcium: 9.7 mg/dL (ref 8.4–10.4)
Chloride: 97 mEq/L — ABNORMAL LOW (ref 98–109)
GLUCOSE: 119 mg/dL (ref 70–140)
Potassium: 4.4 mEq/L (ref 3.5–5.1)
Sodium: 129 mEq/L — ABNORMAL LOW (ref 136–145)
TOTAL PROTEIN: 6.9 g/dL (ref 6.4–8.3)

## 2014-03-12 LAB — CBC WITH DIFFERENTIAL/PLATELET
BASO%: 0.2 % (ref 0.0–2.0)
BASOS ABS: 0 10*3/uL (ref 0.0–0.1)
EOS%: 0.4 % (ref 0.0–7.0)
Eosinophils Absolute: 0 10*3/uL (ref 0.0–0.5)
HEMATOCRIT: 33.7 % — AB (ref 38.4–49.9)
HEMOGLOBIN: 12.2 g/dL — AB (ref 13.0–17.1)
LYMPH%: 11.1 % — ABNORMAL LOW (ref 14.0–49.0)
MCH: 36.4 pg — ABNORMAL HIGH (ref 27.2–33.4)
MCHC: 36.2 g/dL — ABNORMAL HIGH (ref 32.0–36.0)
MCV: 100.6 fL — AB (ref 79.3–98.0)
MONO#: 0.1 10*3/uL (ref 0.1–0.9)
MONO%: 2.8 % (ref 0.0–14.0)
NEUT#: 4 10*3/uL (ref 1.5–6.5)
NEUT%: 85.5 % — ABNORMAL HIGH (ref 39.0–75.0)
Platelets: 199 10*3/uL (ref 140–400)
RBC: 3.35 10*6/uL — ABNORMAL LOW (ref 4.20–5.82)
RDW: 15.2 % — ABNORMAL HIGH (ref 11.0–14.6)
WBC: 4.7 10*3/uL (ref 4.0–10.3)
lymph#: 0.5 10*3/uL — ABNORMAL LOW (ref 0.9–3.3)
nRBC: 0 % (ref 0–0)

## 2014-03-12 MED ORDER — ONDANSETRON 8 MG/NS 50 ML IVPB
INTRAVENOUS | Status: AC
  Filled 2014-03-12: qty 8

## 2014-03-12 MED ORDER — SODIUM CHLORIDE 0.9 % IV SOLN
Freq: Once | INTRAVENOUS | Status: AC
  Administered 2014-03-12: 12:00:00 via INTRAVENOUS

## 2014-03-12 MED ORDER — ONDANSETRON 8 MG/50ML IVPB (CHCC)
8.0000 mg | Freq: Once | INTRAVENOUS | Status: AC
  Administered 2014-03-12: 8 mg via INTRAVENOUS

## 2014-03-12 MED ORDER — DEXAMETHASONE SODIUM PHOSPHATE 10 MG/ML IJ SOLN
INTRAMUSCULAR | Status: AC
  Filled 2014-03-12: qty 1

## 2014-03-12 MED ORDER — DEXAMETHASONE SODIUM PHOSPHATE 10 MG/ML IJ SOLN
10.0000 mg | Freq: Once | INTRAMUSCULAR | Status: AC
  Administered 2014-03-12: 10 mg via INTRAVENOUS

## 2014-03-12 MED ORDER — SODIUM CHLORIDE 0.9 % IV SOLN
500.0000 mg/m2 | Freq: Once | INTRAVENOUS | Status: AC
  Administered 2014-03-12: 975 mg via INTRAVENOUS
  Filled 2014-03-12: qty 39

## 2014-03-12 NOTE — Telephone Encounter (Signed)
Per staff message and POF I have scheduled appts.  JMW  

## 2014-03-12 NOTE — Telephone Encounter (Signed)
Gave pt appt for lab and MD, emailed Sharyn Lull regarding chemo for April

## 2014-03-12 NOTE — Telephone Encounter (Signed)
Talked to pt's wife gave her appt for lab ,md and chemo for April and May

## 2014-03-12 NOTE — Progress Notes (Addendum)
[facility] [phone]   Fax:([phone]) [place], [place] [address]  DIAGNOSIS: Stage IV (T1 A., N0, M1 B.) non-small cell lung cancer, adenocarcinoma with negative EGFR mutation and negative ALK gene translocation diagnosed in October of 2014.  Molecular profile: Negative for RET, ALK, BRAF, MET, EGFR.                                 Positive for ERBB2 A775T, [id number], KRAS G13E, MAP2K1 D67N  (see full report).  PRIOR THERAPY: Status post 6 cycles of systemic chemotherapy with carboplatin for AUC of 5 and Alimta 500 mg/M2 every 3 weeks  CURRENT THERAPY:  Maintenance chemotherapy with single agent Alimta 500 mg/M2 every 3 weeks. First cycle to be given 03/12/2014   CHEMOTHERAPY INTENT: Palliative  CURRENT # OF CHEMOTHERAPY CYCLES: 0  CURRENT ANTIEMETICS: Zofran, Compazine and dexamethasone  CURRENT SMOKING STATUS: Former smoker  ORAL CHEMOTHERAPY AND CONSENT: None  CURRENT BISPHOSPHONATES USE: None  PAIN MANAGEMENT: 0/10  NARCOTICS INDUCED CONSTIPATION: None  LIVING WILL AND CODE STATUS: ?   INTERVAL HISTORY: Anthony Curry 73 y.o. male returns to the clinic today for followup visit accompanied by his wife to proceed with his first cycle of maintenance chemotherapy with single agent Alimta.  The patient is feeling fine today with no specific complaints except for occasional episodes of diarrhea that resolved on her own. He also reports fatigue for the first few days after chemotherapy and then his energy level improves. He denied having any significant chest pain, shortness of breath, cough or hemoptysis. The patient denied having any weight loss or night sweats. He has no nausea or vomiting. He has no fever or chills.   MEDICAL HISTORY: Past Medical History  Diagnosis Date  . Hypertension   . GERD (gastroesophageal reflux disease)   . Alcohol abuse   .  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 Low sodium     ALLERGIES:  has No Known Allergies.  MEDICATIONS:  Current Outpatient Prescriptions  Medication Sig Dispense Refill  . ALPRAZolam (XANAX) 0.25 MG tablet Take 1 tablet (0.25 mg total) by mouth 3 (three) times daily.  90 tablet  0  . amLODipine (NORVASC) 10 MG tablet Take 10 mg by mouth daily.       . Ascorbic Acid (VITAMIN C PO) Take 1 tablet by mouth daily.      [name] atenolol (TENORMIN) 100 MG tablet Take 100 mg by mouth daily.       . calcium carbonate (OS-CAL) 600 MG TABS Take 600 mg by mouth daily with breakfast.      . Cholecalciferol (VITAMIN D-3) 5000 UNITS TABS Take 5,000 Units by mouth daily.       [name] demeclocycline (DECLOMYCIN) 150 MG tablet Take 150 mg by mouth 2 (two) times daily.      [name] dexamethasone (DECADRON) 4 MG tablet 4 milligrams by mouth twice a day the day before, day of and day after the chemotherapy every 3 weeks.  40 tablet  1  . folic acid (FOLVITE) 1 MG tablet Take 1 tablet (1 mg total) by mouth daily.  30 tablet  6  . HYDROcodone-acetaminophen (NORCO/VICODIN) 5-325 MG per tablet Take 1-2 tablets by mouth every 4 (four) hours as needed.  30 tablet  0  . MAGNESIUM PO Take 1 tablet by  mouth daily.      . Multiple Vitamin (MULTIVITAMIN WITH MINERALS) TABS Take 1 tablet by mouth daily.      [name] olmesartan (BENICAR) 40 MG tablet Take 40 mg by mouth daily.       [name] omeprazole (PRILOSEC) 20 MG capsule Take 20 mg by mouth daily.       . Potassium 99 MG TABS Take 1 tablet by mouth daily.      . tamsulosin (FLOMAX) 0.4 MG CAPS capsule Take 0.4 mg by mouth 2 (two) times daily.      [name] thiamine (VITAMIN B-1) 100 MG tablet Take 1 tablet (100 mg total) by mouth daily.  30 tablet  6  . vitamin E 400 UNIT capsule Take 400 Units by mouth daily.      . feeding supplement (ENSURE COMPLETE) LIQD Take 237 mLs by mouth daily.  30 Bottle  0  . prochlorperazine (COMPAZINE) 10 MG tablet Take 1 tablet (10 mg total) by mouth every 6 (six)  hours as needed for nausea or vomiting.  60 tablet  0   No current facility-administered medications for this visit.    SURGICAL HISTORY:  Past Surgical History  Procedure Laterality Date  . Hemorrhoid surgery    . Ankle arthrodesis  1995    rt fx-hardware in  . Tonsillectomy    . Colonoscopy    . Excision / curettage bone cyst finger  2005    rt thumb  . Total shoulder arthroplasty  02/03/2012    Procedure: TOTAL SHOULDER ARTHROPLASTY;  Surgeon: [name], MD;  Location: [facility];  Service: Orthopedics;  Laterality: Left;    REVIEW OF SYSTEMS:  Constitutional: negative Eyes: negative Ears, nose, mouth, throat, and face: negative Respiratory: negative Cardiovascular: negative Gastrointestinal: negative Genitourinary:negative Integument/breast: negative Hematologic/lymphatic: negative Musculoskeletal:negative Neurological: negative Behavioral/Psych: negative Endocrine: negative Allergic/Immunologic: negative   PHYSICAL EXAMINATION: General appearance: alert, cooperative and no distress Head: Normocephalic, without obvious abnormality, atraumatic Neck: no adenopathy, no JVD, supple, symmetrical, trachea midline and thyroid not enlarged, symmetric, no tenderness/mass/nodules Lymph nodes: Cervical, supraclavicular, and axillary nodes normal. Resp: clear to auscultation bilaterally Back: symmetric, no curvature. ROM normal. No CVA tenderness. Cardio: regular rate and rhythm, S1, S2 normal, no murmur, click, rub or gallop GI: soft, non-tender; bowel sounds normal; no masses,  no organomegaly Extremities: extremities normal, atraumatic, no cyanosis or edema Neurologic: Alert and oriented X 3, normal strength and tone. Normal symmetric reflexes. Normal coordination and gait  ECOG PERFORMANCE STATUS: 1 - Symptomatic but completely ambulatory  Blood pressure 122/57, pulse 67, temperature 97.9 F (36.6 C), temperature source Oral, resp. rate 18, height 6'  (1.829 m), weight 169 lb 9.6 oz (76.93 kg), SpO2 97.00%.  LABORATORY DATA: Lab Results  Component Value Date   WBC 4.7 03/12/2014   HGB 12.2* 03/12/2014   HCT 33.7* 03/12/2014   MCV 100.6* 03/12/2014   PLT 199 03/12/2014      Chemistry      Component Value Date/Time   NA 129* 03/12/2014 0953   NA 132* 09/13/2013 0816   K 4.4 03/12/2014 0953   K 3.8 09/13/2013 0816   CL 96 09/13/2013 0816   CO2 24 03/12/2014 0953   CO2 29 09/13/2013 0816   BUN 9.7 03/12/2014 0953   BUN 10 09/13/2013 0816   CREATININE 0.8 03/12/2014 0953   CREATININE 0.8 09/13/2013 0816      Component Value Date/Time   CALCIUM 9.7 03/12/2014 0953   CALCIUM 9.4 09/13/2013 0816  ALKPHOS 74 03/12/2014 0953   ALKPHOS 73 07/05/2013 0441   AST 26 03/12/2014 0953   AST 26 07/05/2013 0441   ALT 11 03/12/2014 0953   ALT 16 07/05/2013 0441   BILITOT 0.79 03/12/2014 0953   BILITOT 1.0 07/05/2013 0441       RADIOGRAPHIC STUDIES: Ct Chest W Contrast  02/24/2014   CLINICAL DATA:  Restaging of lung cancer. Diagnosis 1/15. Ongoing chemotherapy.  EXAM: CT CHEST, ABDOMEN, AND PELVIS WITH CONTRAST  TECHNIQUE: Multidetector CT imaging of the chest, abdomen and pelvis was performed following the standard protocol during bolus administration of intravenous contrast.  CONTRAST:  143m OMNIPAQUE IOHEXOL 300 MG/ML  SOLN  COMPARISON:  CT ABD/PELVIS W CM dated 12/23/2013; CT ABDOMEN W/O CM dated 09/09/2013; NM PET IMAGE INITIAL (PI) SKULL BASE TO THIGH dated 08/27/2013  FINDINGS:   CT CHEST FINDINGS  Lungs/Pleura: Moderate centrilobular emphysema. Multiple ground-glass attenuation pulmonary nodules. Index posterior right upper lobe lesion measures 1.7 x 2.0 cm on image 21, similar to 2.0 x 1.9 cm on the prior exam.  Index spiculated medial left upper lobe nodule measures 1.7 x 1.0 cm on image 9 versus 1.6 x 0.8 cm on the prior exam. Felt to be similar.  Subjacent medial left upper lobe sub solid nodule is difficult to measure secondary to morphology but measures on the  order of 3.1 x 2.1 cm today versus 3.1 x 2.0 cm on the prior.  Spiculated solid left upper lobe nodule measures 1.5 x 0.8 cm on image 21 versus 1.5 x 1.0 cm on the prior. This may be minimally improved.  No dominant new or enlarging nodule.  Trace left-sided pleural thickening which is unchanged.  Heart/Mediastinum: No supraclavicular adenopathy. Aortic and branch vessel atherosclerosis. Tortuous descending thoracic aorta. Mild cardiomegaly with coronary artery atherosclerosis. No pericardial effusion. No central pulmonary embolism, on this non-dedicated study. No mediastinal or hilar adenopathy. Tiny hiatal hernia. Fluid level in the distal thoracic esophagus.    CT ABDOMEN AND PELVIS FINDINGS  Abdomen/Pelvis: 8 mm hyperenhancing focus in the hepatic dome on image 55/series 2 is unchanged  A pericholecystic 5 mm low-density right liver lobe lesion on image 70 is also similar. Likely a cyst. Too small to characterize inferior right hepatic lobe 6 mm low-density lesion on image 75 is similar. A too small to characterize left liver lobe lesion on image 56 is also similar and likely a small cyst.  Normal spleen, stomach, pancreas. Cholelithiasis without acute cholecystitis or biliary ductal dilatation.  Normal right adrenal gland. A left adrenal nodule measures 2.0 x 1.9 cm on image 68/series 2. This is unchanged compared to 2.0 x 2.0 cm cm at the same level on the prior exam.  Mild scarred right kidney. No retroperitoneal or retrocrural adenopathy. Colonic stool burden suggests constipation. Normal terminal ileum. Normal small bowel without abdominal ascites. No evidence of omental or peritoneal disease. Bilateral tiny fat containing inguinal hernias. No pelvic adenopathy. Bladder wall irregularity with multiple saccules. Normal prostate, without significant free pelvic fluid.  Bones/Musculoskeletal: Left ischial bone island. Similar lucent lesion in the right iliac on image 97. Not hypermetabolic at prior PET.   Advanced right glenohumeral joint osteoarthritis. Pectus excavatum deformity. Remote bilateral rib trauma. Left shoulder arthroplasty.    IMPRESSION: CT CHEST IMPRESSION  1. Overall similar to minimal improvement in appearance of the chest. The majority of soft tissue and ground-glass density nodules are similar. There may be minimal improvement in a left upper lobe soft tissue nodule. 2.  No evidence of new or progressive disease within the chest. 3. Esophageal air fluid level suggests dysmotility or gastroesophageal reflux.  CT ABDOMEN AND PELVIS IMPRESSION  1. Similar left adrenal metastasis. 2. No new or progressive disease within the abdomen/pelvis. 3. Liver lesions which are unchanged. Likely small cysts and a hepatic dome perfusion anomaly or flash fill hemangioma. 4. Cholelithiasis. 5. Findings of bladder outlet obstruction. 6.  Possible constipation.   Electronically Signed   By: Abigail Miyamoto M.D.   On: 02/24/2014 13:49   ASSESSMENT AND PLAN: This is a very pleasant 73 years old white male recently diagnosed with metastatic non-small cell lung cancer, adenocarcinoma with negative EGFR mutation and negative ALK gene translocation. He is currently on systemic chemotherapy with carboplatin and Alimta status post 6 cycles. His recent CT scan of the chest, abdomen and pelvis showed stable disease with minimal improvement. Patient was discussed with and also seen by Dr. [name]. He'll proceed with his first cycle of maintenance chemotherapy with single agent Alimta at 500 mg per meter squared given every 3 weeks. He'll followup in 3 weeks with Dr. [name] for another symptom management visit prior to cycle #2 of his maintenance chemotherapy with single agent Alimta. I discussed the scan results and showed the images to the patient and his family today.  He was advised to call immediately if she has any concerning symptoms in the interval. All questions were answered.  The patient knows to call the clinic  with any problems, questions or concerns. We can certainly see the patient much sooner if necessary.  Carlton Adam, PA-C ADDENDUM: Hematology/Oncology:  I had a face to face encounter with the patient. I recommended her care plan.  This is a very pleasant 73 years old white male with metastatic non-small cell lung cancer status post 6 cycles of induction chemotherapy with carboplatin and Alimta with stable disease. The patient is feeling fine today and he is here to start the first cycle of maintenance chemotherapy with single agent Alimta. I reminded the patient adverse effect of this treatment.he will proceed with the treatment today as scheduled. He would come back for follow up visit in 3 weeks with the next cycle of his treatment. He was advised to call immediately if he has any concerning symptoms in the interval.  Disclaimer: This note was dictated with voice recognition software. Similar sounding words can inadvertently be transcribed and may not be corrected upon review. [name], MD 03/15/2014

## 2014-03-12 NOTE — Patient Instructions (Signed)
[facility] Discharge Instructions for Patients Receiving Chemotherapy  Today you received the following chemotherapy agents alimta  To help prevent nausea and vomiting after your treatment, we encourage you to take your nausea medication as directed If you develop nausea and vomiting that is not controlled by your nausea medication, call the clinic.   BELOW ARE SYMPTOMS THAT SHOULD BE REPORTED IMMEDIATELY:  *FEVER GREATER THAN 100.5 F  *CHILLS WITH OR WITHOUT FEVER  NAUSEA AND VOMITING THAT IS NOT CONTROLLED WITH YOUR NAUSEA MEDICATION  *UNUSUAL SHORTNESS OF BREATH  *UNUSUAL BRUISING OR BLEEDING  TENDERNESS IN MOUTH AND THROAT WITH OR WITHOUT PRESENCE OF ULCERS  *URINARY PROBLEMS  *BOWEL PROBLEMS  UNUSUAL RASH Items with * indicate a potential emergency and should be followed up as soon as possible.  Feel free to call the clinic you have any questions or concerns. The clinic phone number is [phone].

## 2014-03-12 NOTE — Telephone Encounter (Signed)
MAILED PTS MEDICAL RECORDS TO [facility] [place] [address] [place] [address]

## 2014-03-14 NOTE — Patient Instructions (Signed)
Followup in 3 weeks prior to your next scheduled cycle of maintenance chemotherapy

## 2014-03-21 ENCOUNTER — Telehealth: Payer: Self-pay | Admitting: Medical Oncology

## 2014-03-21 DIAGNOSIS — R112 Nausea with vomiting, unspecified: Secondary | ICD-10-CM

## 2014-03-21 MED ORDER — PROCHLORPERAZINE MALEATE 10 MG PO TABS: 10.0000 mg | ORAL_TABLET | Freq: Four times a day (QID) | ORAL | Status: DC | PRN

## 2014-03-21 NOTE — Telephone Encounter (Signed)
Refill request from [facility] for compazine and sent to [place] for auth escribe.

## 2014-04-02 ENCOUNTER — Encounter: Payer: Self-pay | Admitting: Internal Medicine

## 2014-04-02 ENCOUNTER — Ambulatory Visit (HOSPITAL_BASED_OUTPATIENT_CLINIC_OR_DEPARTMENT_OTHER): Payer: Medicare Other | Admitting: Internal Medicine

## 2014-04-02 ENCOUNTER — Other Ambulatory Visit (HOSPITAL_BASED_OUTPATIENT_CLINIC_OR_DEPARTMENT_OTHER): Payer: Medicare Other

## 2014-04-02 ENCOUNTER — Telehealth: Payer: Self-pay | Admitting: *Deleted

## 2014-04-02 ENCOUNTER — Telehealth: Payer: Self-pay | Admitting: Internal Medicine

## 2014-04-02 ENCOUNTER — Ambulatory Visit (HOSPITAL_BASED_OUTPATIENT_CLINIC_OR_DEPARTMENT_OTHER): Payer: Medicare Other

## 2014-04-02 VITALS — BP 122/59 | HR 67 | Temp 97.8°F | Resp 19 | Ht 72.0 in | Wt 169.8 lb

## 2014-04-02 DIAGNOSIS — C7970 Secondary malignant neoplasm of unspecified adrenal gland: Secondary | ICD-10-CM

## 2014-04-02 DIAGNOSIS — C3410 Malignant neoplasm of upper lobe, unspecified bronchus or lung: Secondary | ICD-10-CM

## 2014-04-02 DIAGNOSIS — Z5111 Encounter for antineoplastic chemotherapy: Secondary | ICD-10-CM

## 2014-04-02 DIAGNOSIS — R5381 Other malaise: Secondary | ICD-10-CM

## 2014-04-02 DIAGNOSIS — R5383 Other fatigue: Secondary | ICD-10-CM

## 2014-04-02 DIAGNOSIS — C3490 Malignant neoplasm of unspecified part of unspecified bronchus or lung: Secondary | ICD-10-CM

## 2014-04-02 LAB — COMPREHENSIVE METABOLIC PANEL (CC13)
ALT: 10 U/L (ref 0–55)
AST: 17 U/L (ref 5–34)
Albumin: 3.7 g/dL (ref 3.5–5.0)
Alkaline Phosphatase: 79 U/L (ref 40–150)
Anion Gap: 10 mEq/L (ref 3–11)
BUN: 9.2 mg/dL (ref 7.0–26.0)
CALCIUM: 9.9 mg/dL (ref 8.4–10.4)
CO2: 26 mEq/L (ref 22–29)
Chloride: 96 mEq/L — ABNORMAL LOW (ref 98–109)
Creatinine: 0.8 mg/dL (ref 0.7–1.3)
GLUCOSE: 106 mg/dL (ref 70–140)
POTASSIUM: 4.1 meq/L (ref 3.5–5.1)
Sodium: 131 mEq/L — ABNORMAL LOW (ref 136–145)
TOTAL PROTEIN: 6.3 g/dL — AB (ref 6.4–8.3)
Total Bilirubin: 0.5 mg/dL (ref 0.20–1.20)

## 2014-04-02 LAB — CBC WITH DIFFERENTIAL/PLATELET
BASO%: 0 % (ref 0.0–2.0)
BASOS ABS: 0 10*3/uL (ref 0.0–0.1)
EOS ABS: 0 10*3/uL (ref 0.0–0.5)
EOS%: 0 % (ref 0.0–7.0)
HCT: 32 % — ABNORMAL LOW (ref 38.4–49.9)
HGB: 11.4 g/dL — ABNORMAL LOW (ref 13.0–17.1)
LYMPH#: 0.8 10*3/uL — AB (ref 0.9–3.3)
LYMPH%: 12.7 % — ABNORMAL LOW (ref 14.0–49.0)
MCH: 36 pg — ABNORMAL HIGH (ref 27.2–33.4)
MCHC: 35.6 g/dL (ref 32.0–36.0)
MCV: 100.9 fL — ABNORMAL HIGH (ref 79.3–98.0)
MONO#: 0.6 10*3/uL (ref 0.1–0.9)
MONO%: 8.9 % (ref 0.0–14.0)
NEUT%: 78.4 % — ABNORMAL HIGH (ref 39.0–75.0)
NEUTROS ABS: 5.2 10*3/uL (ref 1.5–6.5)
Platelets: 261 10*3/uL (ref 140–400)
RBC: 3.17 10*6/uL — ABNORMAL LOW (ref 4.20–5.82)
RDW: 14 % (ref 11.0–14.6)
WBC: 6.6 10*3/uL (ref 4.0–10.3)

## 2014-04-02 MED ORDER — PEMETREXED DISODIUM CHEMO INJECTION 500 MG
500.0000 mg/m2 | Freq: Once | INTRAVENOUS | Status: AC
  Administered 2014-04-02: 975 mg via INTRAVENOUS
  Filled 2014-04-02: qty 39

## 2014-04-02 MED ORDER — SODIUM CHLORIDE 0.9 % IV SOLN
Freq: Once | INTRAVENOUS | Status: AC
  Administered 2014-04-02: 13:00:00 via INTRAVENOUS

## 2014-04-02 MED ORDER — ONDANSETRON 8 MG/NS 50 ML IVPB
INTRAVENOUS | Status: AC
  Filled 2014-04-02: qty 8

## 2014-04-02 MED ORDER — ONDANSETRON 8 MG/50ML IVPB (CHCC)
8.0000 mg | Freq: Once | INTRAVENOUS | Status: AC
  Administered 2014-04-02: 8 mg via INTRAVENOUS

## 2014-04-02 MED ORDER — DEXAMETHASONE SODIUM PHOSPHATE 10 MG/ML IJ SOLN
10.0000 mg | Freq: Once | INTRAMUSCULAR | Status: AC
  Administered 2014-04-02: 10 mg via INTRAVENOUS

## 2014-04-02 MED ORDER — DEXAMETHASONE SODIUM PHOSPHATE 10 MG/ML IJ SOLN
INTRAMUSCULAR | Status: AC
  Filled 2014-04-02: qty 1

## 2014-04-02 NOTE — Progress Notes (Signed)
Labs within treatment parameters. Patient tolerated infusion well.

## 2014-04-02 NOTE — Telephone Encounter (Signed)
Per staff message and POF I have scheduled appts.  JMW  

## 2014-04-02 NOTE — Telephone Encounter (Signed)
Gave pt appt for lab,md and chemo for May, emailed michelle regarding chemo for june 2015

## 2014-04-02 NOTE — Patient Instructions (Signed)
[facility] Discharge Instructions for Patients Receiving Chemotherapy  Today you received the following chemotherapy agents: Alimta  To help prevent nausea and vomiting after your treatment, we encourage you to take your nausea medication: Compazine 10 mg every 6 hrs as needed.    If you develop nausea and vomiting that is not controlled by your nausea medication, call the clinic.   BELOW ARE SYMPTOMS THAT SHOULD BE REPORTED IMMEDIATELY:  *FEVER GREATER THAN 100.5 F  *CHILLS WITH OR WITHOUT FEVER  NAUSEA AND VOMITING THAT IS NOT CONTROLLED WITH YOUR NAUSEA MEDICATION  *UNUSUAL SHORTNESS OF BREATH  *UNUSUAL BRUISING OR BLEEDING  TENDERNESS IN MOUTH AND THROAT WITH OR WITHOUT PRESENCE OF ULCERS  *URINARY PROBLEMS  *BOWEL PROBLEMS  UNUSUAL RASH Items with * indicate a potential emergency and should be followed up as soon as possible.  Feel free to call the clinic you have any questions or concerns. The clinic phone number is [phone].

## 2014-04-02 NOTE — Progress Notes (Signed)
[facility] [phone]   Fax:(336) [place], [place] [address]  DIAGNOSIS: Stage IV (T1 A., N0, M1 B.) non-small cell lung cancer, adenocarcinoma with negative EGFR mutation and negative ALK gene translocation diagnosed in October of 2014.  Molecular profile: Negative for RET, ALK, BRAF, MET, EGFR.                                 Positive for ERBB2 A775T, [id number], KRAS G13E, MAP2K1 D67N  (see full report).  PRIOR THERAPY: Systemic chemotherapy with carboplatin for AUC of 5 and Alimta 500 mg/M2 every 3 weeks, status post 6 cycles with stable disease. First dose on 10/23/2013.[name]  CURRENT THERAPY: Maintenance systemic chemotherapy with single agent Alimta 500 mg/M2 every 3 weeks. Status post 1 cycle.  CHEMOTHERAPY INTENT: Palliative  CURRENT # OF CHEMOTHERAPY CYCLES: 2  CURRENT ANTIEMETICS: Zofran, Compazine and dexamethasone  CURRENT SMOKING STATUS: Former smoker  ORAL CHEMOTHERAPY AND CONSENT: None  CURRENT BISPHOSPHONATES USE: None  PAIN MANAGEMENT: 0/10  NARCOTICS INDUCED CONSTIPATION: None  LIVING WILL AND CODE STATUS: ?   INTERVAL HISTORY: Anthony Curry 73 y.o. male returns to the clinic today for followup visit accompanied by his wife and daughter. The patient is feeling fine today with no specific complaints. He is currently on treatment with maintenance Alimta status post 1 cycle and tolerated the first cycle of his treatment fairly well except for 3 days of fatigue and aching started several days after his treatment. He is not sure if he was sick from something else at that time but he recovered this continuously and felt much better since that episode. He denied having any significant chest pain, shortness of breath, cough or hemoptysis. The patient denied having any weight loss or night sweats. He has no nausea or vomiting. He has no fever or chills. He  is here today to start cycle #2 of his maintenance chemotherapy.  MEDICAL HISTORY: Past Medical History  Diagnosis Date  . Hypertension   . GERD (gastroesophageal reflux disease)   . Alcohol abuse   . 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 Low sodium     ALLERGIES:  has No Known Allergies.  MEDICATIONS:  Current Outpatient Prescriptions  Medication Sig Dispense Refill  . ALPRAZolam (XANAX) 0.25 MG tablet Take 1 tablet (0.25 mg total) by mouth 3 (three) times daily.  90 tablet  0  . amLODipine (NORVASC) 10 MG tablet Take 10 mg by mouth daily.       . Ascorbic Acid (VITAMIN C PO) Take 1 tablet by mouth daily.      [name] atenolol (TENORMIN) 100 MG tablet Take 100 mg by mouth daily.       . calcium carbonate (OS-CAL) 600 MG TABS Take 600 mg by mouth daily with breakfast.      . Cholecalciferol (VITAMIN D-3) 5000 UNITS TABS Take 5,000 Units by mouth daily.       [name] demeclocycline (DECLOMYCIN) 150 MG tablet Take 150 mg by mouth 2 (two) times daily.      [name] dexamethasone (DECADRON) 4 MG tablet 4 milligrams by mouth twice a day the day before, day of and day after the chemotherapy every 3 weeks.  40 tablet  1  . feeding supplement (ENSURE COMPLETE) LIQD Take 237 mLs by mouth daily.  30 Bottle  0  .  folic acid (FOLVITE) 1 MG tablet Take 1 tablet (1 mg total) by mouth daily.  30 tablet  6  . HYDROcodone-acetaminophen (NORCO/VICODIN) 5-325 MG per tablet Take 1-2 tablets by mouth every 4 (four) hours as needed.  30 tablet  0  . MAGNESIUM PO Take 400 mg by mouth daily.       . Multiple Vitamin (MULTIVITAMIN WITH MINERALS) TABS Take 1 tablet by mouth daily.      [name] olmesartan (BENICAR) 40 MG tablet Take 40 mg by mouth daily.       [name] omeprazole (PRILOSEC) 20 MG capsule Take 20 mg by mouth daily.       . Potassium 99 MG TABS Take 1 tablet by mouth daily.      . prochlorperazine (COMPAZINE) 10 MG tablet Take 1 tablet (10 mg total) by mouth every 6 (six) hours as needed for nausea or vomiting.  60  tablet  0  . tamsulosin (FLOMAX) 0.4 MG CAPS capsule Take 0.4 mg by mouth 2 (two) times daily.      [name] thiamine (VITAMIN B-1) 100 MG tablet Take 1 tablet (100 mg total) by mouth daily.  30 tablet  6  . vitamin E 400 UNIT capsule Take 400 Units by mouth daily.       No current facility-administered medications for this visit.    SURGICAL HISTORY:  Past Surgical History  Procedure Laterality Date  . Hemorrhoid surgery    . Ankle arthrodesis  1995    rt fx-hardware in  . Tonsillectomy    . Colonoscopy    . Excision / curettage bone cyst finger  2005    rt thumb  . Total shoulder arthroplasty  02/03/2012    Procedure: TOTAL SHOULDER ARTHROPLASTY;  Surgeon: [name], MD;  Location: [facility];  Service: Orthopedics;  Laterality: Left;    REVIEW OF SYSTEMS:  Constitutional: negative Eyes: negative Ears, nose, mouth, throat, and face: negative Respiratory: negative Cardiovascular: negative Gastrointestinal: negative Genitourinary:negative Integument/breast: negative Hematologic/lymphatic: negative Musculoskeletal:negative Neurological: negative Behavioral/Psych: negative Endocrine: negative Allergic/Immunologic: negative   PHYSICAL EXAMINATION: General appearance: alert, cooperative and no distress Head: Normocephalic, without obvious abnormality, atraumatic Neck: no adenopathy, no JVD, supple, symmetrical, trachea midline and thyroid not enlarged, symmetric, no tenderness/mass/nodules Lymph nodes: Cervical, supraclavicular, and axillary nodes normal. Resp: clear to auscultation bilaterally Back: symmetric, no curvature. ROM normal. No CVA tenderness. Cardio: regular rate and rhythm, S1, S2 normal, no murmur, click, rub or gallop GI: soft, non-tender; bowel sounds normal; no masses,  no organomegaly Extremities: extremities normal, atraumatic, no cyanosis or edema Neurologic: Alert and oriented X 3, normal strength and tone. Normal symmetric reflexes. Normal  coordination and gait  ECOG PERFORMANCE STATUS: 1 - Symptomatic but completely ambulatory  Blood pressure 122/59, pulse 67, temperature 97.8 F (36.6 C), temperature source Oral, resp. rate 19, height 6' (1.829 m), weight 169 lb 12.8 oz (77.021 kg).  LABORATORY DATA: Lab Results  Component Value Date   WBC 6.6 04/02/2014   HGB 11.4* 04/02/2014   HCT 32.0* 04/02/2014   MCV 100.9* 04/02/2014   PLT 261 04/02/2014      Chemistry      Component Value Date/Time   NA 131* 04/02/2014 1041   NA 132* 09/13/2013 0816   K 4.1 04/02/2014 1041   K 3.8 09/13/2013 0816   CL 96 09/13/2013 0816   CO2 26 04/02/2014 1041   CO2 29 09/13/2013 0816   BUN 9.2 04/02/2014 1041   BUN 10 09/13/2013  9532   CREATININE 0.8 04/02/2014 1041   CREATININE 0.8 09/13/2013 0816      Component Value Date/Time   CALCIUM 9.9 04/02/2014 1041   CALCIUM 9.4 09/13/2013 0816   ALKPHOS 79 04/02/2014 1041   ALKPHOS 73 07/05/2013 0441   AST 17 04/02/2014 1041   AST 26 07/05/2013 0441   ALT 10 04/02/2014 1041   ALT 16 07/05/2013 0441   BILITOT 0.50 04/02/2014 1041   BILITOT 1.0 07/05/2013 0441       RADIOGRAPHIC STUDIES:  ASSESSMENT AND PLAN: This is a very pleasant 73 years old white male recently diagnosed with metastatic non-small cell lung cancer, adenocarcinoma with negative EGFR mutation and negative ALK gene translocation. He is currently on systemic chemotherapy with carboplatin and Alimta status post 6 cycles. He is currently on maintenance chemotherapy with single agent Alimta and tolerated the first cycle fairly well. We will proceed with cycle #2 today as scheduled. He would come back for followup visit in 2 weeks for evaluation before starting cycle #3. He was advised to call immediately if she has any concerning symptoms in the interval. All questions were answered.  The patient knows to call the clinic with any problems, questions or concerns. We can certainly see the patient much sooner if necessary.  Disclaimer:  This note was dictated with voice recognition software. Similar sounding words can inadvertently be transcribed and may not be corrected upon review.

## 2014-04-07 ENCOUNTER — Telehealth: Payer: Self-pay | Admitting: Internal Medicine

## 2014-04-07 ENCOUNTER — Other Ambulatory Visit: Payer: Self-pay | Admitting: *Deleted

## 2014-04-07 DIAGNOSIS — C3490 Malignant neoplasm of unspecified part of unspecified bronchus or lung: Secondary | ICD-10-CM

## 2014-04-07 MED ORDER — HYDROCODONE-ACETAMINOPHEN 5-325 MG PO TABS: 1.0000 | ORAL_TABLET | ORAL | Status: DC | PRN

## 2014-04-07 NOTE — Telephone Encounter (Signed)
Talked to pt and gave him appt for May and June , lab,ML and chemo

## 2014-04-10 ENCOUNTER — Encounter (HOSPITAL_COMMUNITY): Payer: Self-pay | Admitting: Emergency Medicine

## 2014-04-10 ENCOUNTER — Emergency Department (HOSPITAL_COMMUNITY)
Admission: EM | Admit: 2014-04-10 | Discharge: 2014-04-11 | Disposition: A | Discharge status: Home / Self Care | Payer: Medicare Other | Attending: Emergency Medicine | Admitting: Emergency Medicine

## 2014-04-10 DIAGNOSIS — R5383 Other fatigue: Secondary | ICD-10-CM

## 2014-04-10 DIAGNOSIS — R42 Dizziness and giddiness: Secondary | ICD-10-CM | POA: Insufficient documentation

## 2014-04-10 DIAGNOSIS — C3490 Malignant neoplasm of unspecified part of unspecified bronchus or lung: Secondary | ICD-10-CM | POA: Insufficient documentation

## 2014-04-10 DIAGNOSIS — R55 Syncope and collapse: Secondary | ICD-10-CM | POA: Diagnosis not present

## 2014-04-10 DIAGNOSIS — D649 Anemia, unspecified: Secondary | ICD-10-CM | POA: Diagnosis not present

## 2014-04-10 DIAGNOSIS — Z79899 Other long term (current) drug therapy: Secondary | ICD-10-CM | POA: Diagnosis not present

## 2014-04-10 DIAGNOSIS — K219 Gastro-esophageal reflux disease without esophagitis: Secondary | ICD-10-CM | POA: Insufficient documentation

## 2014-04-10 DIAGNOSIS — I951 Orthostatic hypotension: Secondary | ICD-10-CM | POA: Diagnosis not present

## 2014-04-10 DIAGNOSIS — Z8739 Personal history of other diseases of the musculoskeletal system and connective tissue: Secondary | ICD-10-CM | POA: Insufficient documentation

## 2014-04-10 DIAGNOSIS — R5381 Other malaise: Secondary | ICD-10-CM | POA: Diagnosis not present

## 2014-04-10 DIAGNOSIS — IMO0002 Reserved for concepts with insufficient information to code with codable children: Secondary | ICD-10-CM | POA: Diagnosis not present

## 2014-04-10 DIAGNOSIS — E860 Dehydration: Secondary | ICD-10-CM | POA: Diagnosis not present

## 2014-04-10 DIAGNOSIS — E871 Hypo-osmolality and hyponatremia: Secondary | ICD-10-CM | POA: Diagnosis not present

## 2014-04-10 DIAGNOSIS — Z792 Long term (current) use of antibiotics: Secondary | ICD-10-CM | POA: Diagnosis not present

## 2014-04-10 DIAGNOSIS — I10 Essential (primary) hypertension: Secondary | ICD-10-CM | POA: Insufficient documentation

## 2014-04-10 DIAGNOSIS — Z87891 Personal history of nicotine dependence: Secondary | ICD-10-CM | POA: Diagnosis not present

## 2014-04-10 LAB — URINALYSIS, ROUTINE W REFLEX MICROSCOPIC
Bilirubin Urine: NEGATIVE
Glucose, UA: NEGATIVE mg/dL
Hgb urine dipstick: NEGATIVE
KETONES UR: NEGATIVE mg/dL
LEUKOCYTES UA: NEGATIVE
Nitrite: NEGATIVE
PH: 6.5 (ref 5.0–8.0)
Protein, ur: NEGATIVE mg/dL
Specific Gravity, Urine: 1.012 (ref 1.005–1.030)
UROBILINOGEN UA: 0.2 mg/dL (ref 0.0–1.0)

## 2014-04-10 LAB — CBC WITH DIFFERENTIAL/PLATELET
BASOS ABS: 0 10*3/uL (ref 0.0–0.1)
Basophils Relative: 0 % (ref 0–1)
EOS PCT: 5 % (ref 0–5)
Eosinophils Absolute: 0.1 10*3/uL (ref 0.0–0.7)
HCT: 29 % — ABNORMAL LOW (ref 39.0–52.0)
Hemoglobin: 10.4 g/dL — ABNORMAL LOW (ref 13.0–17.0)
LYMPHS ABS: 0.8 10*3/uL (ref 0.7–4.0)
Lymphocytes Relative: 27 % (ref 12–46)
MCH: 35.7 pg — ABNORMAL HIGH (ref 26.0–34.0)
MCHC: 35.9 g/dL (ref 30.0–36.0)
MCV: 99.7 fL (ref 78.0–100.0)
MONOS PCT: 19 % — AB (ref 3–12)
Monocytes Absolute: 0.6 10*3/uL (ref 0.1–1.0)
Neutro Abs: 1.4 10*3/uL — ABNORMAL LOW (ref 1.7–7.7)
Neutrophils Relative %: 49 % (ref 43–77)
Platelets: 119 10*3/uL — ABNORMAL LOW (ref 150–400)
RBC: 2.91 MIL/uL — ABNORMAL LOW (ref 4.22–5.81)
RDW: 13.7 % (ref 11.5–15.5)
WBC: 2.9 10*3/uL — AB (ref 4.0–10.5)

## 2014-04-10 LAB — COMPREHENSIVE METABOLIC PANEL
ALK PHOS: 67 U/L (ref 39–117)
ALT: 16 U/L (ref 0–53)
AST: 31 U/L (ref 0–37)
Albumin: 3.2 g/dL — ABNORMAL LOW (ref 3.5–5.2)
BUN: 20 mg/dL (ref 6–23)
CHLORIDE: 85 meq/L — AB (ref 96–112)
CO2: 24 mEq/L (ref 19–32)
Calcium: 8.9 mg/dL (ref 8.4–10.5)
Creatinine, Ser: 1.02 mg/dL (ref 0.50–1.35)
GFR calc non Af Amer: 70 mL/min — ABNORMAL LOW (ref >90)
GFR, EST AFRICAN AMERICAN: 82 mL/min — AB (ref >90)
GLUCOSE: 110 mg/dL — AB (ref 70–99)
POTASSIUM: 4.7 meq/L (ref 3.7–5.3)
Sodium: 122 mEq/L — ABNORMAL LOW (ref 137–147)
Total Bilirubin: 0.4 mg/dL (ref 0.3–1.2)
Total Protein: 6.1 g/dL (ref 6.0–8.3)

## 2014-04-10 LAB — I-STAT TROPONIN, ED: TROPONIN I, POC: 0.01 ng/mL (ref 0.00–0.08)

## 2014-04-10 MED ORDER — SODIUM CHLORIDE 0.9 % IV BOLUS (SEPSIS)
1000.0000 mL | Freq: Once | INTRAVENOUS | Status: AC
  Administered 2014-04-10: 1000 mL via INTRAVENOUS

## 2014-04-10 NOTE — ED Notes (Signed)
Pt states that he had a few syncopal episodes today, never lost consciousness but had to grab on to something to keep from falling

## 2014-04-10 NOTE — ED Provider Notes (Signed)
CSN: [id number]     Arrival date & time 04/10/14  2113 History   First MD Initiated Contact with Patient 04/10/14 2138     Chief Complaint  Patient presents with  . Near Syncope     (Consider location/radiation/quality/duration/timing/severity/associated sxs/prior Treatment) HPI Anthony Curry is a 73 y.o. 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 Last treatment a week ago. States that this morning, he woke up not feeling well. States when got up, he because dizzy, light headed, and had to sit down for several minutes after which time symptoms improved. He states he had two more episodes today that were similar where he would get light headed, pale after standing, each time improving after sitting down. Patient states that he has not eaten or drank much today. He denies any headache, neck pain or stiffness, denies any chest pain, no shortness of breath. No swelling in extremities. States he never lost consciousness completely. States he did not want to come to the emergency department but his family member made him.  Past Medical History  Diagnosis Date  . Hypertension   . GERD (gastroesophageal reflux disease)   . Alcohol abuse   . 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 Low sodium    Past Surgical History  Procedure Laterality Date  . Hemorrhoid surgery    . Ankle arthrodesis  1995    rt fx-hardware in  . Tonsillectomy    . Colonoscopy    . Excision / curettage bone cyst finger  2005    rt thumb  . Total shoulder arthroplasty  02/03/2012    Procedure: TOTAL SHOULDER ARTHROPLASTY;  Surgeon: [name], MD;  Location: [facility];  Service: Orthopedics;  Laterality: Left;   Family History  Problem Relation Age of Onset  . Brain cancer Brother   . Lung cancer Mother   . Hypertension Father   . Diabetes Paternal Grandmother    History  Substance Use Topics  .  Smoking status: Former Smoker -- 2.00 packs/day for 30 years    Types: Cigarettes    Quit date: 01/27/1984  . Smokeless tobacco: Not on file  . Alcohol Use: 0.0 oz/week    4-5 Cans of beer per week     Comment: admits to 4-6 beers daily    Review of Systems  Constitutional: Positive for fatigue. Negative for fever and chills.  Respiratory: Negative for cough, chest tightness and shortness of breath.   Cardiovascular: Negative for chest pain, palpitations and leg swelling.  Gastrointestinal: Negative for nausea, vomiting, abdominal pain, diarrhea and abdominal distention.  Genitourinary: Negative for dysuria, urgency, frequency and hematuria.  Musculoskeletal: Negative for arthralgias, myalgias, neck pain and neck stiffness.  Skin: Negative for rash.  Allergic/Immunologic: Negative for immunocompromised state.  Neurological: Positive for dizziness, weakness and light-headedness. Negative for syncope, numbness and headaches.      Allergies  Review of patient's allergies indicates no known allergies.  Home Medications   Prior to Admission medications   Medication Sig Start Date End Date Taking? Authorizing Provider  ALPRAZolam (XANAX) 0.25 MG tablet Take 0.25 mg by mouth 3 (three) times daily as needed for anxiety or sleep.   Yes Historical Provider, MD  amLODipine (NORVASC) 10 MG tablet Take 10 mg by mouth daily.    Yes Historical Provider, MD  Ascorbic Acid (VITAMIN C PO) Take 1 tablet by mouth daily.   Yes Historical Provider,  MD  atenolol (TENORMIN) 100 MG tablet Take 100 mg by mouth daily.    Yes Historical Provider, MD  calcium carbonate (OS-CAL) 600 MG TABS Take 600 mg by mouth daily with breakfast.   Yes Historical Provider, MD  Cholecalciferol (VITAMIN D-3) 5000 UNITS TABS Take 5,000 Units by mouth daily.    Yes Historical Provider, MD  demeclocycline (DECLOMYCIN) 150 MG tablet Take 150 mg by mouth 2 (two) times daily.   Yes Historical Provider, MD  dexamethasone (DECADRON)  4 MG tablet 4 milligrams by mouth twice a day the day before, day of and day after the chemotherapy every 3 weeks. 10/15/13  Yes [name], MD  feeding supplement (ENSURE COMPLETE) LIQD Take 237 mLs by mouth daily. 07/08/13  Yes Monika Salk, MD  folic acid (FOLVITE) 1 MG tablet Take 1 tablet (1 mg total) by mouth daily. 09/11/13  Yes Erick Colace, NP  HYDROcodone-acetaminophen (NORCO/VICODIN) 5-325 MG per tablet Take 1-2 tablets by mouth every 4 (four) hours as needed. 04/07/14  Yes [name], MD  MAGNESIUM PO Take 400 mg by mouth daily.    Yes Historical Provider, MD  Multiple Vitamin (MULTIVITAMIN WITH MINERALS) TABS Take 1 tablet by mouth daily.   Yes Historical Provider, MD  olmesartan (BENICAR) 40 MG tablet Take 40 mg by mouth daily.    Yes Historical Provider, MD  Omega-3 Fatty Acids (FISH OIL PO) Take 1 tablet by mouth daily.   Yes Historical Provider, MD  omeprazole (PRILOSEC) 20 MG capsule Take 20 mg by mouth daily.    Yes Historical Provider, MD  OVER THE COUNTER MEDICATION Inject 1 Units into the vein daily.   Yes Historical Provider, MD  Potassium 99 MG TABS Take 1 tablet by mouth daily.   Yes Historical Provider, MD  prochlorperazine (COMPAZINE) 10 MG tablet Take 1 tablet (10 mg total) by mouth every 6 (six) hours as needed for nausea or vomiting. 03/21/14  Yes Carlton Adam, PA-C  tamsulosin (FLOMAX) 0.4 MG CAPS capsule Take 0.4 mg by mouth 2 (two) times daily.   Yes Historical Provider, MD  thiamine (VITAMIN B-1) 100 MG tablet Take 1 tablet (100 mg total) by mouth daily. 09/11/13  Yes Erick Colace, NP  vitamin E 400 UNIT capsule Take 400 Units by mouth daily.   Yes Historical Provider, MD   BP 109/59  Pulse 72  Temp(Src) 98.1 F (36.7 C) (Oral)  Resp 18  SpO2 96% Physical Exam  Nursing note and vitals reviewed. Constitutional: He is oriented to person, place, and time. He appears well-developed and well-nourished. No distress.  HENT:  Head: Normocephalic and  atraumatic.  Right Ear: External ear normal.  Left Ear: External ear normal.  Nose: Nose normal.  Mouth/Throat: Oropharynx is clear and moist.  Eyes: Conjunctivae and EOM are normal. Pupils are equal, round, and reactive to light.  Neck: Neck supple.  Cardiovascular: Normal rate, regular rhythm and normal heart sounds.   Pulmonary/Chest: Effort normal. No respiratory distress. He has no wheezes. He has no rales.  Abdominal: Soft. Bowel sounds are normal. He exhibits no distension. There is no tenderness. There is no rebound.  Musculoskeletal: He exhibits no edema.  Neurological: He is alert and oriented to person, place, and time.  Skin: Skin is warm and dry.    ED Course  Procedures (including critical care time) Labs Review Labs Reviewed  CBC WITH DIFFERENTIAL - Abnormal; Notable for the following:    WBC 2.9 (*)  RBC 2.91 (*)    Hemoglobin 10.4 (*)    HCT 29.0 (*)    MCH 35.7 (*)    Platelets 119 (*)    Monocytes Relative 19 (*)    Neutro Abs 1.4 (*)    All other components within normal limits  COMPREHENSIVE METABOLIC PANEL - Abnormal; Notable for the following:    Sodium 122 (*)    Chloride 85 (*)    Glucose, Bld 110 (*)    Albumin 3.2 (*)    GFR calc non Af Amer 70 (*)    GFR calc Af Amer 82 (*)    All other components within normal limits  URINE CULTURE  URINALYSIS, ROUTINE W REFLEX MICROSCOPIC  I-STAT TROPOININ, ED    Imaging Review No results found.   EKG Interpretation None       Date: 04/11/2014  Rate: 77  Rhythm: atrial fibrillation  QRS Axis: normal  Intervals: normal  ST/T Wave abnormalities: normal  Conduction Disutrbances:none  Narrative Interpretation:   Old EKG Reviewed: changes noted new onset afib. Rate controlled.   MDM   Final diagnoses:  Near syncope  Dehydration  Hyponatremia  Anemia  Orthostatic hypotension    Patient emergency department with dizziness upon standing onset today. He is orthostatic in emergency  department. I have ordered him IV fluids. Will check labs and electrolytes.   Patient received 2 L of saline. His CBC is at baseline. He is sodium chloride and are markedly low. This is most likely due to dehydration. Patient does not want to stay in the hospital, requesting to be discharged home. I this time his vital signs are normal. His ECG does show afib, appears to be new onset. Rate is controlled. Will need outpatient follow up. He is able to ambulate without dizziness. Patient will need close followup with his primary care doctor. He agrees. We'll discharge home with close followup. PT denies any CP or SOB. Do not think this is cardiac.   Filed Vitals:   04/10/14 2241 04/10/14 2244 04/10/14 2246 04/11/14 0017  BP: 109/59 113/58 94/56 114/60  Pulse: 75 85 96 79  Temp:    98.6 F (37 C)  TempSrc:    Oral  Resp:    12  SpO2:    98%       Florene Route Korvin Valentine, PA-C 04/11/14 0057

## 2014-04-10 NOTE — ED Notes (Signed)
Patients HR was very jumpy and irregular while getting orthostatics.

## 2014-04-10 NOTE — ED Notes (Signed)
MD at bedside. 

## 2014-04-11 NOTE — ED Provider Notes (Signed)
  Face-to-face evaluation   History: He complains of near-syncope, today, several times. He has had decreased appetite for several days after his last chemotherapy treatment  Physical exam: Alert, cooperative. He is lucid. He moves all extremities equally.  Medical screening examination/treatment/procedure(s) were conducted as a shared visit with non-physician practitioner(s) and myself.  I personally evaluated the patient during the encounter  Richarda Blade, MD 04/12/14 [phone]

## 2014-04-11 NOTE — Discharge Instructions (Signed)
Make sure to drink plenty of fluids to prevent any further dehydration. Followup closely with your primary care Dr. or your oncologist for blood work recheck. Your blood work did show a very low sodium and chloride levels. Return if your symptoms are worsening.  Orthostatic Hypotension Orthostatic hypotension is a sudden fall in blood pressure. It occurs when a person goes from a sitting or lying position to a standing position. CAUSES   Loss of body fluids (dehydration).  Medicines that lower blood pressure.  Sudden changes in posture, such as sudden standing when you have been sitting or lying down.  Taking too much of your medicine. SYMPTOMS   Lightheadedness or dizziness.  Fainting or near-fainting.  A fast heart rate (tachycardia).  Weakness.  Feeling tired (fatigue). DIAGNOSIS  Your caregiver may find the cause of orthostatic hypotension through:  A history and/or physical exam.  Checking your blood pressure. Your caregiver will check your blood pressure when you are:  Lying down.  Sitting.  Standing.  Tilt table testing. In this test, you are placed on a table that goes from a lying position to a standing position. You will be strapped to the table. This test helps to monitor your blood pressure and heart rate when you are in different positions. TREATMENT   If orthostatic hypotension is caused by your medicines, your caregiver will need to adjust your dosage. Do not stop or adjust your medicine on your own.  When changing positions, make these changes slowly. This allows your body to adjust to the different position.  Compression stockings that are worn on your lower legs may be helpful.  Your caregiver may have you consume extra salt. Do not add extra salt to your diet unless directed by your caregiver.  Eat frequent, small meals. Avoid sudden standing after eating.  Avoid hot showers or excessive heat.  Your caregiver may give you fluids through the vein  (intravenous).  Your caregiver may put you on medicine to help enhance fluid retention. SEEK IMMEDIATE MEDICAL CARE IF:   You faint or have a near-fainting episode. Call your local emergency services (911 in [place]  You have or develop chest pain.  You feel sick to your stomach (nauseous) or vomit.  You have a loss of feeling or movement in your arms or legs.  You have difficulty talking, slurred speech, or you are unable to talk.  You have difficulty thinking or have confused thinking. MAKE SURE YOU:   Understand these instructions.  Will watch your condition.  Will get help right away if you are not doing well or get worse. Document Released: 11/11/2002 Document Revised: 02/13/2012 Document Reviewed: 03/06/2009 [facility] Patient Information 2014 [place], [place].   Dehydration, Adult Dehydration is when you lose more fluids from the body than you take in. Vital organs like the kidneys, brain, and heart cannot function without a proper amount of fluids and salt. Any loss of fluids from the body can cause dehydration.  CAUSES   Vomiting.  Diarrhea.  Excessive sweating.  Excessive urine output.  Fever. SYMPTOMS  Mild dehydration  Thirst.  Dry lips.  Slightly dry mouth. Moderate dehydration  Very dry mouth.  Sunken eyes.  Skin does not bounce back quickly when lightly pinched and released.  Dark urine and decreased urine production.  Decreased tear production.  Headache. Severe dehydration  Very dry mouth.  Extreme thirst.  Rapid, weak pulse (more than 100 beats per minute at rest).  Cold hands and feet.  Not able to  sweat in spite of heat and temperature.  Rapid breathing.  Blue lips.  Confusion and lethargy.  Difficulty being awakened.  Minimal urine production.  No tears. DIAGNOSIS  Your caregiver will diagnose dehydration based on your symptoms and your exam. Blood and urine tests will help confirm the diagnosis. The diagnostic  evaluation should also identify the cause of dehydration. TREATMENT  Treatment of mild or moderate dehydration can often be done at home by increasing the amount of fluids that you drink. It is best to drink small amounts of fluid more often. Drinking too much at one time can make vomiting worse. Refer to the home care instructions below. Severe dehydration needs to be treated at the hospital where you will probably be given intravenous (IV) fluids that contain water and electrolytes. HOME CARE INSTRUCTIONS   Ask your caregiver about specific rehydration instructions.  Drink enough fluids to keep your urine clear or pale yellow.  Drink small amounts frequently if you have nausea and vomiting.  Eat as you normally do.  Avoid:  Foods or drinks high in sugar.  Carbonated drinks.  Juice.  Extremely hot or cold fluids.  Drinks with caffeine.  Fatty, greasy foods.  Alcohol.  Tobacco.  Overeating.  Gelatin desserts.  Wash your hands well to avoid spreading bacteria and viruses.  Only take over-the-counter or prescription medicines for pain, discomfort, or fever as directed by your caregiver.  Ask your caregiver if you should continue all prescribed and over-the-counter medicines.  Keep all follow-up appointments with your caregiver. SEEK MEDICAL CARE IF:  You have abdominal pain and it increases or stays in one area (localizes).  You have a rash, stiff neck, or severe headache.  You are irritable, sleepy, or difficult to awaken.  You are weak, dizzy, or extremely thirsty. SEEK IMMEDIATE MEDICAL CARE IF:   You are unable to keep fluids down or you get worse despite treatment.  You have frequent episodes of vomiting or diarrhea.  You have blood or green matter (bile) in your vomit.  You have blood in your stool or your stool looks black and tarry.  You have not urinated in 6 to 8 hours, or you have only urinated a small amount of very dark urine.  You have a  fever.  You faint. MAKE SURE YOU:   Understand these instructions.  Will watch your condition.  Will get help right away if you are not doing well or get worse. Document Released: 11/21/2005 Document Revised: 02/13/2012 Document Reviewed: 07/11/2011 [facility] Patient Information 2014 [place], [place].

## 2014-04-12 LAB — URINE CULTURE: Colony Count: 5000

## 2014-04-12 NOTE — ED Provider Notes (Deleted)
Medical screening examination/treatment/procedure(s) were performed by non-physician practitioner and as supervising physician I was immediately available for consultation/collaboration.  Richarda Blade, MD 04/12/14 [phone]

## 2014-04-17 DIAGNOSIS — R609 Edema, unspecified: Secondary | ICD-10-CM | POA: Diagnosis not present

## 2014-04-17 DIAGNOSIS — F411 Generalized anxiety disorder: Secondary | ICD-10-CM | POA: Diagnosis not present

## 2014-04-17 DIAGNOSIS — Z0000 Encounter for general adult medical examination without abnormal findings: Secondary | ICD-10-CM | POA: Diagnosis not present

## 2014-04-17 DIAGNOSIS — L02619 Cutaneous abscess of unspecified foot: Secondary | ICD-10-CM | POA: Diagnosis not present

## 2014-04-17 DIAGNOSIS — K219 Gastro-esophageal reflux disease without esophagitis: Secondary | ICD-10-CM | POA: Diagnosis not present

## 2014-04-17 DIAGNOSIS — I10 Essential (primary) hypertension: Secondary | ICD-10-CM | POA: Diagnosis not present

## 2014-04-17 DIAGNOSIS — L03119 Cellulitis of unspecified part of limb: Secondary | ICD-10-CM | POA: Diagnosis not present

## 2014-04-17 DIAGNOSIS — E871 Hypo-osmolality and hyponatremia: Secondary | ICD-10-CM | POA: Diagnosis not present

## 2014-04-17 DIAGNOSIS — Z23 Encounter for immunization: Secondary | ICD-10-CM | POA: Diagnosis not present

## 2014-04-21 ENCOUNTER — Telehealth: Payer: Self-pay | Admitting: *Deleted

## 2014-04-21 ENCOUNTER — Other Ambulatory Visit: Payer: Self-pay | Admitting: *Deleted

## 2014-04-21 DIAGNOSIS — C3490 Malignant neoplasm of unspecified part of unspecified bronchus or lung: Secondary | ICD-10-CM

## 2014-04-21 NOTE — Telephone Encounter (Signed)
Pt's wife called concerned about the patient.  He has not been eating and has been sleeping a lot.  A week ago his PCP assessed him and pt showed him his left leg which was red and swollen.  The PCP put the patient on a diuretic and 10 days of an antibiotic.  His swelling and redness is not improved after being on the antibiotic for almost a week.  She states that it is dark on top of his left foot.  Per AJ, pt needs to be evaluated for a DVT.  Appt made at 10am on 04/22/14.  Instructed that if anything changes or gets worse he is to report to the ED.  She verbalized understanding.  Also advised we will address his eating and fatigue at his f/u on 5/20.  SLJ

## 2014-04-22 ENCOUNTER — Telehealth: Payer: Self-pay | Admitting: *Deleted

## 2014-04-22 ENCOUNTER — Ambulatory Visit (HOSPITAL_COMMUNITY)
Admission: RE | Admit: 2014-04-22 | Discharge: 2014-04-22 | Disposition: A | Discharge status: Home / Self Care | Payer: Medicare Other | Source: Ambulatory Visit | Attending: Internal Medicine | Admitting: Internal Medicine

## 2014-04-22 DIAGNOSIS — C3490 Malignant neoplasm of unspecified part of unspecified bronchus or lung: Secondary | ICD-10-CM

## 2014-04-22 DIAGNOSIS — M7989 Other specified soft tissue disorders: Secondary | ICD-10-CM | POA: Diagnosis not present

## 2014-04-22 NOTE — Progress Notes (Signed)
*  PRELIMINARY RESULTS* Vascular Ultrasound Lower extremity venous duplex has been completed.  Preliminary findings: No evidence of DVT.  Attempted call report to Dr. Lew Dawes nurse line. Left message with results. Let patient leave.   Landry Mellow, RDMS, RVT  04/22/2014, 10:07 AM

## 2014-04-22 NOTE — Telephone Encounter (Signed)
Bilateral Doppler study was negative for LE DVT.  Dr Vista Mink informed.  SLJ

## 2014-04-22 NOTE — Telephone Encounter (Signed)
Mr Sprigg with spouse and daughter in lobby c/o swelling to leg and extreme fatigue.  Daughter reports "he was not this fatigued with the stronger chemotherapy.  He is not himself.  The swelling is spreading."   Results of doppler study = negative.  Shared with patient and family.  Aware of f/u visit tomorrow and will be here.  Instructed to keep legs elevated.  Asked about water intake.  "Not enough" was his response.  Instructed to increase water intake to help decrease swelling.

## 2014-04-23 ENCOUNTER — Telehealth: Payer: Self-pay | Admitting: Internal Medicine

## 2014-04-23 ENCOUNTER — Other Ambulatory Visit (HOSPITAL_BASED_OUTPATIENT_CLINIC_OR_DEPARTMENT_OTHER): Payer: Medicare Other

## 2014-04-23 ENCOUNTER — Ambulatory Visit (HOSPITAL_BASED_OUTPATIENT_CLINIC_OR_DEPARTMENT_OTHER): Payer: Medicare Other | Admitting: Physician Assistant

## 2014-04-23 ENCOUNTER — Encounter: Payer: Self-pay | Admitting: Physician Assistant

## 2014-04-23 ENCOUNTER — Ambulatory Visit: Payer: Medicare Other

## 2014-04-23 ENCOUNTER — Ambulatory Visit: Payer: Medicare Other | Admitting: Physician Assistant

## 2014-04-23 ENCOUNTER — Ambulatory Visit (HOSPITAL_COMMUNITY)
Admission: RE | Admit: 2014-04-23 | Discharge: 2014-04-23 | Disposition: A | Discharge status: Home / Self Care | Payer: Medicare Other | Source: Ambulatory Visit | Attending: Internal Medicine | Admitting: Internal Medicine

## 2014-04-23 VITALS — BP 138/65 | HR 74 | Temp 98.5°F | Resp 20 | Ht 72.0 in | Wt 167.9 lb

## 2014-04-23 DIAGNOSIS — R600 Localized edema: Secondary | ICD-10-CM

## 2014-04-23 DIAGNOSIS — C3490 Malignant neoplasm of unspecified part of unspecified bronchus or lung: Secondary | ICD-10-CM | POA: Diagnosis not present

## 2014-04-23 DIAGNOSIS — I509 Heart failure, unspecified: Secondary | ICD-10-CM | POA: Diagnosis not present

## 2014-04-23 DIAGNOSIS — I10 Essential (primary) hypertension: Secondary | ICD-10-CM | POA: Diagnosis not present

## 2014-04-23 DIAGNOSIS — C7970 Secondary malignant neoplasm of unspecified adrenal gland: Secondary | ICD-10-CM

## 2014-04-23 DIAGNOSIS — Z01810 Encounter for preprocedural cardiovascular examination: Secondary | ICD-10-CM | POA: Insufficient documentation

## 2014-04-23 DIAGNOSIS — M7989 Other specified soft tissue disorders: Secondary | ICD-10-CM | POA: Diagnosis not present

## 2014-04-23 DIAGNOSIS — C3410 Malignant neoplasm of upper lobe, unspecified bronchus or lung: Secondary | ICD-10-CM

## 2014-04-23 DIAGNOSIS — L03119 Cellulitis of unspecified part of limb: Secondary | ICD-10-CM

## 2014-04-23 DIAGNOSIS — L02419 Cutaneous abscess of limb, unspecified: Secondary | ICD-10-CM | POA: Diagnosis not present

## 2014-04-23 DIAGNOSIS — I517 Cardiomegaly: Secondary | ICD-10-CM

## 2014-04-23 LAB — CBC WITH DIFFERENTIAL/PLATELET
BASO%: 0.5 % (ref 0.0–2.0)
BASOS ABS: 0.1 10*3/uL (ref 0.0–0.1)
EOS%: 0 % (ref 0.0–7.0)
Eosinophils Absolute: 0 10*3/uL (ref 0.0–0.5)
HCT: 35.1 % — ABNORMAL LOW (ref 38.4–49.9)
HEMOGLOBIN: 12.2 g/dL — AB (ref 13.0–17.1)
LYMPH#: 0.3 10*3/uL — AB (ref 0.9–3.3)
LYMPH%: 3.4 % — ABNORMAL LOW (ref 14.0–49.0)
MCH: 36.5 pg — AB (ref 27.2–33.4)
MCHC: 34.6 g/dL (ref 32.0–36.0)
MCV: 105.2 fL — ABNORMAL HIGH (ref 79.3–98.0)
MONO#: 0.6 10*3/uL (ref 0.1–0.9)
MONO%: 5.8 % (ref 0.0–14.0)
NEUT#: 9 10*3/uL — ABNORMAL HIGH (ref 1.5–6.5)
NEUT%: 90.3 % — ABNORMAL HIGH (ref 39.0–75.0)
Platelets: 349 10*3/uL (ref 140–400)
RBC: 3.34 10*6/uL — AB (ref 4.20–5.82)
RDW: 14.9 % — AB (ref 11.0–14.6)
WBC: 10 10*3/uL (ref 4.0–10.3)

## 2014-04-23 LAB — COMPREHENSIVE METABOLIC PANEL (CC13)
ALBUMIN: 3.5 g/dL (ref 3.5–5.0)
ALT: 19 U/L (ref 0–55)
AST: 32 U/L (ref 5–34)
Alkaline Phosphatase: 84 U/L (ref 40–150)
Anion Gap: 16 mEq/L — ABNORMAL HIGH (ref 3–11)
BUN: 16 mg/dL (ref 7.0–26.0)
CALCIUM: 9.8 mg/dL (ref 8.4–10.4)
CHLORIDE: 90 meq/L — AB (ref 98–109)
CO2: 23 mEq/L (ref 22–29)
Creatinine: 0.7 mg/dL (ref 0.7–1.3)
Glucose: 134 mg/dl (ref 70–140)
POTASSIUM: 4.2 meq/L (ref 3.5–5.1)
SODIUM: 128 meq/L — AB (ref 136–145)
TOTAL PROTEIN: 6.8 g/dL (ref 6.4–8.3)
Total Bilirubin: 0.77 mg/dL (ref 0.20–1.20)

## 2014-04-23 MED ORDER — AMOXICILLIN-POT CLAVULANATE 875-125 MG PO TABS: 1.0000 | ORAL_TABLET | Freq: Two times a day (BID) | ORAL | Status: DC

## 2014-04-23 MED ORDER — FUROSEMIDE 20 MG PO TABS: 40.0000 mg | ORAL_TABLET | Freq: Every day | ORAL | Status: DC

## 2014-04-23 NOTE — Progress Notes (Addendum)
[facility] [phone]   Fax:([phone]) [place], [place] [address]  DIAGNOSIS: Stage IV (T1 A., N0, M1 B.) non-small cell lung cancer, adenocarcinoma with negative EGFR mutation and negative ALK gene translocation diagnosed in October of 2014.  Molecular profile: Negative for RET, ALK, BRAF, MET, EGFR.                                 Positive for ERBB2 A775T, [id number], KRAS G13E, MAP2K1 D67N  (see full report).  PRIOR THERAPY: Systemic chemotherapy with carboplatin for AUC of 5 and Alimta 500 mg/M2 every 3 weeks, status post 6 cycles with stable disease. First dose on 10/23/2013.[name]  CURRENT THERAPY: Maintenance systemic chemotherapy with single agent Alimta 500 mg/M2 every 3 weeks. Status post 2 cycles.  CHEMOTHERAPY INTENT: Palliative  CURRENT # OF CHEMOTHERAPY CYCLES: 2  CURRENT ANTIEMETICS: Zofran, Compazine and dexamethasone  CURRENT SMOKING STATUS: Former smoker  ORAL CHEMOTHERAPY AND CONSENT: None  CURRENT BISPHOSPHONATES USE: None  PAIN MANAGEMENT: 0/10  NARCOTICS INDUCED CONSTIPATION: None  LIVING WILL AND CODE STATUS: ?   INTERVAL HISTORY: Anthony Curry 73 y.o. male returns to the clinic today for followup visit accompanied by his wife and daughter,[name]. He complains of having a rough week or so. He continues to complain of bilateral lower extremity edema, redness and discoloration. He was seen by his primary care physician and placed on a 10 day course of Keflex as well as Lasix at 20 mg by mouth daily. The Keflex as ordered at 500 mg by mouth twice daily. Patient notified our office with this lower extremity edema and bilateral lower extremity Dopplers were ordered to evaluate for possible deep vein thrombosis. This study was performed on 04/22/2014 and was negative for deep vein thrombosis. He states that because of what is going on with his feet he has  felt  weak. His by mouth intake of both food and fluids has been decreased particularly her last week or so. Towards the beginning of May the patient actually was taken to the emergency room and given IV fluids for "dehydration". He notes generalized increase in fatigue. He presents today to proceed with cycle #3 of his maintenance chemotherapy with single agent Alimta at 500 mg per meter squared given every 3 weeks.  He denied having any significant chest pain, shortness of breath, cough or hemoptysis. The patient denied having any significantweight loss or night sweats. He has no nausea or vomiting. He has no fever or chills.   MEDICAL HISTORY: Past Medical History  Diagnosis Date  . Hypertension   . GERD (gastroesophageal reflux disease)   . Alcohol abuse   . 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 Low sodium     ALLERGIES:  has No Known Allergies.  MEDICATIONS:  Current Outpatient Prescriptions  Medication Sig Dispense Refill  . ALPRAZolam (XANAX) 0.25 MG tablet Take 0.25 mg by mouth 3 (three) times daily as needed for anxiety or sleep.      [name] amLODipine (NORVASC) 10 MG tablet Take 10 mg by mouth daily.       . Ascorbic Acid (VITAMIN C PO) Take 1 tablet by mouth daily.      [name] atenolol (TENORMIN) 100 MG tablet Take 100 mg by mouth daily.       . calcium carbonate (OS-CAL) 600 MG TABS  Take 600 mg by mouth daily with breakfast.      . Cholecalciferol (VITAMIN D-3) 5000 UNITS TABS Take 5,000 Units by mouth daily.       [name] demeclocycline (DECLOMYCIN) 150 MG tablet Take 150 mg by mouth 2 (two) times daily.      [name] dexamethasone (DECADRON) 4 MG tablet 4 milligrams by mouth twice a day the day before, day of and day after the chemotherapy every 3 weeks.  40 tablet  1  . feeding supplement (ENSURE COMPLETE) LIQD Take 237 mLs by mouth daily.  30 Bottle  0  . folic acid (FOLVITE) 1 MG tablet Take 1 tablet (1 mg total) by mouth daily.  30 tablet  6  . HYDROcodone-acetaminophen (NORCO/VICODIN)  5-325 MG per tablet Take 1-2 tablets by mouth every 4 (four) hours as needed.  30 tablet  0  . MAGNESIUM PO Take 400 mg by mouth daily.       . Multiple Vitamin (MULTIVITAMIN WITH MINERALS) TABS Take 1 tablet by mouth daily.      [name] olmesartan (BENICAR) 40 MG tablet Take 40 mg by mouth daily.       . Omega-3 Fatty Acids (FISH OIL PO) Take 1 tablet by mouth daily.      [name] OVER THE COUNTER MEDICATION Inject 1 Units into the vein daily.      . Potassium 99 MG TABS Take 1 tablet by mouth daily.      . tamsulosin (FLOMAX) 0.4 MG CAPS capsule Take 0.4 mg by mouth 2 (two) times daily.      [name] thiamine (VITAMIN B-1) 100 MG tablet Take 1 tablet (100 mg total) by mouth daily.  30 tablet  6  . vitamin E 400 UNIT capsule Take 400 Units by mouth daily.      [name] amoxicillin-clavulanate (AUGMENTIN) 875-125 MG per tablet Take 1 tablet by mouth 2 (two) times daily.  14 tablet  0  . furosemide (LASIX) 20 MG tablet Take 2 tablets (40 mg total) by mouth daily.  30 tablet  0  . omeprazole (PRILOSEC) 20 MG capsule Take 20 mg by mouth daily.       . prochlorperazine (COMPAZINE) 10 MG tablet Take 1 tablet (10 mg total) by mouth every 6 (six) hours as needed for nausea or vomiting.  60 tablet  0   No current facility-administered medications for this visit.    SURGICAL HISTORY:  Past Surgical History  Procedure Laterality Date  . Hemorrhoid surgery    . Ankle arthrodesis  1995    rt fx-hardware in  . Tonsillectomy    . Colonoscopy    . Excision / curettage bone cyst finger  2005    rt thumb  . Total shoulder arthroplasty  02/03/2012    Procedure: TOTAL SHOULDER ARTHROPLASTY;  Surgeon: [name], MD;  Location: [facility];  Service: Orthopedics;  Laterality: Left;    REVIEW OF SYSTEMS:  Constitutional: positive for anorexia, fatigue and malaise Eyes: negative Ears, nose, mouth, throat, and face: negative Respiratory: negative Cardiovascular: positive for lower extremity  edema Gastrointestinal: negative Genitourinary:negative Integument/breast: negative Hematologic/lymphatic: negative Musculoskeletal:negative Neurological: negative Behavioral/Psych: negative Endocrine: negative Allergic/Immunologic: negative   PHYSICAL EXAMINATION: General appearance: alert, cooperative and no distress Head: Normocephalic, without obvious abnormality, atraumatic Neck: no adenopathy, no JVD, supple, symmetrical, trachea midline and thyroid not enlarged, symmetric, no tenderness/mass/nodules Lymph nodes: Cervical, supraclavicular, and axillary nodes normal. Resp: clear to auscultation bilaterally Back: symmetric, no curvature. ROM normal. No CVA  tenderness. Cardio: regular rate and rhythm, S1, S2 normal, no murmur, click, rub or gallop GI: soft, non-tender; bowel sounds normal; no masses,  no organomegaly Extremities: edema 2+ to 3+ pitting edema bilateral lower extremities. Left greater than right. There is erythema , warm , also more so on the left with hyperpigmentation across the metatarsal joint region Neurologic: Alert and oriented X 3, normal strength and tone. Normal symmetric reflexes. Normal coordination and gait  ECOG PERFORMANCE STATUS: 1 - Symptomatic but completely ambulatory  Blood pressure 138/65, pulse 74, temperature 98.5 F (36.9 C), temperature source Oral, resp. rate 20, height 6' (1.829 m), weight 167 lb 14.4 oz (76.159 kg).  LABORATORY DATA: Lab Results  Component Value Date   WBC 10.0 04/23/2014   HGB 12.2* 04/23/2014   HCT 35.1* 04/23/2014   MCV 105.2* 04/23/2014   PLT 349 04/23/2014      Chemistry      Component Value Date/Time   NA 128* 04/23/2014 0846   NA 122* 04/10/2014 2205   K 4.2 04/23/2014 0846   K 4.7 04/10/2014 2205   CL 85* 04/10/2014 2205   CO2 23 04/23/2014 0846   CO2 24 04/10/2014 2205   BUN 16.0 04/23/2014 0846   BUN 20 04/10/2014 2205   CREATININE 0.7 04/23/2014 0846   CREATININE 1.02 04/10/2014 2205      Component Value Date/Time    CALCIUM 9.8 04/23/2014 0846   CALCIUM 8.9 04/10/2014 2205   ALKPHOS 84 04/23/2014 0846   ALKPHOS 67 04/10/2014 2205   AST 32 04/23/2014 0846   AST 31 04/10/2014 2205   ALT 19 04/23/2014 0846   ALT 16 04/10/2014 2205   BILITOT 0.77 04/23/2014 0846   BILITOT 0.4 04/10/2014 2205       RADIOGRAPHIC STUDIES:  ASSESSMENT AND PLAN: This is a very pleasant 73 years old white male recently diagnosed with metastatic non-small cell lung cancer, adenocarcinoma with negative EGFR mutation and negative ALK gene translocation. He is currently on systemic chemotherapy with carboplatin and Alimta status post 6 cycles. He is currently on maintenance chemotherapy with single agent Alimta. He is status post 2 cycles. Patient was discussed with and also seen by Dr. [name]. The etiology of his lower extremity edema is unclear. Patient will be sent for a 2-D echocardiogram to evaluate for possible congestive heart failure. We will see the patient back in one week to discuss results. In the interim we will postpone chemotherapy until the etiology of his lower extremity edema is defined.  He was advised to call immediately if she has any concerning symptoms in the interval. All questions were answered.  The patient knows to call the clinic with any problems, questions or concerns. We can certainly see the patient much sooner if necessary.  Carlton Adam, PA-C  ADDENDUM: Hematology/Oncology Attending:  I had a face to face encounter with the patient. I recommended his care plan. This is a very pleasant 73 years old white male with stage IV non-small cell lung cancer, adenocarcinoma currently undergoing maintenance chemotherapy with single agent Alimta status post 2 cycles. He is rating his chemotherapy fairly well with no significant adverse effects but recently the patient started complaining of increasing fatigue and weakness as well as significant swelling of his lower extremities with more erythema on the right side. He  has Venous DUPLEX performed recently and was negative for DVT. He was recently treated by his primary care physician with Keflex 500 mg by mouth twice a day for questionable  cellulitis of the right lower extremity but no significant improvement. I recommended for the patient to resume his treatment with Lasix 20 mg by mouth twice a day for one week in addition to starting him on Augmentin 875 mg by mouth twice a day. I would also delay his treatment with the chemotherapy for now until improvement in the swelling and cellulitis of the lower extremities. He would come back for follow up visit in one week for reevaluation. He was advised to call immediately if he has any concerning symptoms in the interval.  Disclaimer: This note was dictated with voice recognition software. Similar sounding words can inadvertently be transcribed and may not be corrected upon review. [name], MD 04/27/2014

## 2014-04-23 NOTE — Progress Notes (Signed)
  Echocardiogram 2D Echocardiogram has been performed.  Alyson Locket Jymir Dunaj 04/23/2014, 1:14 PM

## 2014-04-23 NOTE — Telephone Encounter (Signed)
Gave pt appt for 2 d-echo today @ [facility] 1 pm , gave referral to [facility] for precert

## 2014-04-23 NOTE — Telephone Encounter (Signed)
Talked to pt, gave him appt for 5/28 MD only no labs per POF, pt scheduled with AJ on 5/28 sue to no availability on 5/27 , emailed AJ

## 2014-04-24 ENCOUNTER — Telehealth: Payer: Self-pay | Admitting: *Deleted

## 2014-04-24 NOTE — Telephone Encounter (Signed)
Echo results reviewed by Awilda [facility], per Ad[facility], no acute anomalities.  He is to continue antibiotics and lasix and come to f/u 05/01/14.  Pt's wife verbalized understanding.  SLJ

## 2014-04-26 NOTE — Patient Instructions (Signed)
You been scheduled for an echocardiogram to further evaluate your lower extremity swelling Followup in one week for further evaluation. The chemotherapy is on hold in the interim

## 2014-04-29 ENCOUNTER — Telehealth: Payer: Self-pay | Admitting: Medical Oncology

## 2014-04-29 NOTE — Telephone Encounter (Signed)
Onc tx sent

## 2014-05-01 ENCOUNTER — Ambulatory Visit (HOSPITAL_BASED_OUTPATIENT_CLINIC_OR_DEPARTMENT_OTHER): Payer: Medicare Other | Admitting: Physician Assistant

## 2014-05-01 ENCOUNTER — Telehealth: Payer: Self-pay | Admitting: Internal Medicine

## 2014-05-01 ENCOUNTER — Encounter: Payer: Self-pay | Admitting: Physician Assistant

## 2014-05-01 ENCOUNTER — Encounter: Payer: Self-pay | Admitting: *Deleted

## 2014-05-01 VITALS — BP 93/64 | HR 75 | Temp 98.0°F | Resp 18 | Ht 72.0 in | Wt 159.4 lb

## 2014-05-01 DIAGNOSIS — F329 Major depressive disorder, single episode, unspecified: Secondary | ICD-10-CM

## 2014-05-01 DIAGNOSIS — R609 Edema, unspecified: Secondary | ICD-10-CM

## 2014-05-01 DIAGNOSIS — C3490 Malignant neoplasm of unspecified part of unspecified bronchus or lung: Secondary | ICD-10-CM

## 2014-05-01 DIAGNOSIS — F3289 Other specified depressive episodes: Secondary | ICD-10-CM | POA: Diagnosis not present

## 2014-05-01 MED ORDER — HYDROCODONE-ACETAMINOPHEN 5-325 MG PO TABS: 1.0000 | ORAL_TABLET | ORAL | Status: DC | PRN

## 2014-05-01 MED ORDER — MIRTAZAPINE 15 MG PO TABS: 15.0000 mg | ORAL_TABLET | Freq: Every day | ORAL | Status: DC

## 2014-05-01 MED ORDER — FUROSEMIDE 20 MG PO TABS: ORAL_TABLET | ORAL | Status: DC

## 2014-05-01 NOTE — Progress Notes (Signed)
[facility] [phone]   Fax:(336) [place], [place] [address]  DIAGNOSIS: Stage IV (T1 A., N0, M1 B.) non-small cell lung cancer, adenocarcinoma with negative EGFR mutation and negative ALK gene translocation diagnosed in October of 2014.  Molecular profile: Negative for RET, ALK, BRAF, MET, EGFR.                                 Positive for ERBB2 A775T, EGB1D176H, KRAS G13E, MAP2K1 D67N  (see full report).  PRIOR THERAPY: Systemic chemotherapy with carboplatin for AUC of 5 and Alimta 500 mg/M2 every 3 weeks, status post 6 cycles with stable disease. First dose on 10/23/2013.[name]  CURRENT THERAPY: Maintenance systemic chemotherapy with single agent Alimta 500 mg/M2 every 3 weeks. Status post 2 cycles.  CHEMOTHERAPY INTENT: Palliative  CURRENT # OF CHEMOTHERAPY CYCLES: 2  CURRENT ANTIEMETICS: Zofran, Compazine and dexamethasone  CURRENT SMOKING STATUS: Former smoker  ORAL CHEMOTHERAPY AND CONSENT: None  CURRENT BISPHOSPHONATES USE: None  PAIN MANAGEMENT: 0/10  NARCOTICS INDUCED CONSTIPATION: None  LIVING WILL AND CODE STATUS: ?   INTERVAL HISTORY: Anthony Curry 73 y.o. male returns to the clinic today for followup visit accompanied by his wife and daughter,Anthony Curry. He has significant bilateral lower extremity edema with redness and discoloration. He was completing a course of Keflex 500 mg by mouth twice daily as prescribed by his primary care physician. He was also placed on Lasix at 20 mg by his primary care physician. We placed him on a seven-day course of Augmentin for suspected cellulitis and increased his Lasix to 40 mg by mouth daily. He's had a significant improvement in his lower extremity edema. He did have some diarrhea associated with the Augmentin. Patient was advised to take a probiotic while he was on an antibiotic but did not follow that  direction. He complains of depressive symptoms. He reports a remote bout with depression of approximately 25 years ago. He states at that time he "talked himself out of it". He is tearful today. To further evaluate his lower extremity edema we scheduled patient had an echocardiogram and he is here to discuss the results. Patient states that since he had the echocardiogram he is beginning intermittent sharp pains along his sternum. He is not associated with any radiation of the pain, no nausea vomiting or diaphoresis. Pain is fleeting but uncomfortable. He continues to have back pain in the right mid subscapular area. He requests a refill for his pain medication.  He denied having any significant chest pain, shortness of breath, cough or hemoptysis. The patient denied having any significant weight loss or night sweats. He has no nausea or vomiting. He has no fever or chills.   MEDICAL HISTORY: Past Medical History  Diagnosis Date  . Hypertension   . GERD (gastroesophageal reflux disease)   . Alcohol abuse   . 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 Low sodium     ALLERGIES:  has No Known Allergies.  MEDICATIONS:  Current Outpatient Prescriptions  Medication Sig Dispense Refill  . ALPRAZolam (XANAX) 0.25 MG tablet Take 0.25 mg by mouth 3 (three) times daily as needed for anxiety or sleep.      [name] amLODipine (NORVASC) 10 MG tablet Take 10 mg by mouth daily.       . Ascorbic Acid (VITAMIN C PO) Take  1 tablet by mouth daily.      [name] atenolol (TENORMIN) 100 MG tablet Take 100 mg by mouth daily.       . calcium carbonate (OS-CAL) 600 MG TABS Take 600 mg by mouth daily with breakfast.      . Cholecalciferol (VITAMIN D-3) 5000 UNITS TABS Take 5,000 Units by mouth daily.       [name] dexamethasone (DECADRON) 4 MG tablet 4 milligrams by mouth twice a day the day before, day of and day after the chemotherapy every 3 weeks.  40 tablet  1  . feeding supplement (ENSURE COMPLETE) LIQD Take 237 mLs by mouth  daily.  30 Bottle  0  . folic acid (FOLVITE) 1 MG tablet Take 1 tablet (1 mg total) by mouth daily.  30 tablet  6  . furosemide (LASIX) 20 MG tablet Take 1 to 2 tablets by mouth daily as  Needed for lower extremity swelling  30 tablet  0  . HYDROcodone-acetaminophen (NORCO/VICODIN) 5-325 MG per tablet Take 1-2 tablets by mouth every 4 (four) hours as needed.  30 tablet  0  . MAGNESIUM PO Take 400 mg by mouth daily.       . Multiple Vitamin (MULTIVITAMIN WITH MINERALS) TABS Take 1 tablet by mouth daily.      [name] olmesartan (BENICAR) 40 MG tablet Take 40 mg by mouth daily.       . Omega-3 Fatty Acids (FISH OIL PO) Take 1 tablet by mouth daily.      [name] omeprazole (PRILOSEC) 20 MG capsule Take 20 mg by mouth daily.       [name] OVER THE COUNTER MEDICATION Inject 1 Units into the vein daily.      . Potassium 99 MG TABS Take 1 tablet by mouth daily.      . tamsulosin (FLOMAX) 0.4 MG CAPS capsule Take 0.4 mg by mouth 2 (two) times daily.      [name] thiamine (VITAMIN B-1) 100 MG tablet Take 1 tablet (100 mg total) by mouth daily.  30 tablet  6  . vitamin E 400 UNIT capsule Take 400 Units by mouth daily.      [name] amoxicillin-clavulanate (AUGMENTIN) 875-125 MG per tablet Take 1 tablet by mouth 2 (two) times daily.  14 tablet  0  . demeclocycline (DECLOMYCIN) 150 MG tablet Take 150 mg by mouth 2 (two) times daily.      . mirtazapine (REMERON) 15 MG tablet Take 1 tablet (15 mg total) by mouth at bedtime.  30 tablet  3  . prochlorperazine (COMPAZINE) 10 MG tablet Take 1 tablet (10 mg total) by mouth every 6 (six) hours as needed for nausea or vomiting.  60 tablet  0   No current facility-administered medications for this visit.    SURGICAL HISTORY:  Past Surgical History  Procedure Laterality Date  . Hemorrhoid surgery    . Ankle arthrodesis  1995    rt fx-hardware in  . Tonsillectomy    . Colonoscopy    . Excision / curettage bone cyst finger  2005    rt thumb  . Total shoulder arthroplasty  02/03/2012     Procedure: TOTAL SHOULDER ARTHROPLASTY;  Surgeon: [name], MD;  Location: [facility];  Service: Orthopedics;  Laterality: Left;    REVIEW OF SYSTEMS:  Constitutional: positive for anorexia, fatigue and malaise Eyes: negative Ears, nose, mouth, throat, and face: negative Respiratory: negative Cardiovascular: positive for lower extremity edema Gastrointestinal: negative Genitourinary:negative Integument/breast: negative Hematologic/lymphatic: negative  Musculoskeletal:positive for bone pain Neurological: negative Behavioral/Psych: positive for depression Endocrine: negative Allergic/Immunologic: negative   PHYSICAL EXAMINATION: General appearance: alert, cooperative and no distress Head: Normocephalic, without obvious abnormality, atraumatic Neck: no adenopathy, no JVD, supple, symmetrical, trachea midline and thyroid not enlarged, symmetric, no tenderness/mass/nodules Lymph nodes: Cervical, supraclavicular, and axillary nodes normal. Resp: clear to auscultation bilaterally Back: symmetric, no curvature. ROM normal. No CVA tenderness. Cardio: regular rate and rhythm, S1, S2 normal, no murmur, click, rub or gallop GI: soft, non-tender; bowel sounds normal; no masses,  no organomegaly Extremities: edema trace  to 1+ pitting edema bilateral lower extremities. Left greater than right. There is erythema , warm , also more so on the left with hyperpigmentation across the metatarsal joint region Neurologic: Alert and oriented X 3, normal strength and tone. Normal symmetric reflexes. Normal coordination and gait  ECOG PERFORMANCE STATUS: 1 - Symptomatic but completely ambulatory  Blood pressure 93/64, pulse 75, temperature 98 F (36.7 C), temperature source Oral, resp. rate 18, height 6' (1.829 m), weight 159 lb 6.4 oz (72.303 kg), SpO2 98.00%.  LABORATORY DATA: Lab Results  Component Value Date   WBC 10.0 04/23/2014   HGB 12.2* 04/23/2014   HCT 35.1* 04/23/2014   MCV  105.2* 04/23/2014   PLT 349 04/23/2014      Chemistry      Component Value Date/Time   NA 128* 04/23/2014 0846   NA 122* 04/10/2014 2205   K 4.2 04/23/2014 0846   K 4.7 04/10/2014 2205   CL 85* 04/10/2014 2205   CO2 23 04/23/2014 0846   CO2 24 04/10/2014 2205   BUN 16.0 04/23/2014 0846   BUN 20 04/10/2014 2205   CREATININE 0.7 04/23/2014 0846   CREATININE 1.02 04/10/2014 2205      Component Value Date/Time   CALCIUM 9.8 04/23/2014 0846   CALCIUM 8.9 04/10/2014 2205   ALKPHOS 84 04/23/2014 0846   ALKPHOS 67 04/10/2014 2205   AST 32 04/23/2014 0846   AST 31 04/10/2014 2205   ALT 19 04/23/2014 0846   ALT 16 04/10/2014 2205   BILITOT 0.77 04/23/2014 0846   BILITOT 0.4 04/10/2014 2205       RADIOGRAPHIC STUDIES: No results found.  ASSESSMENT AND PLAN: This is a very pleasant 73 years old white male recently diagnosed with metastatic non-small cell lung cancer, adenocarcinoma with negative EGFR mutation and negative ALK gene translocation. He is currently on systemic chemotherapy with carboplatin and Alimta status post 6 cycles. He is currently on maintenance chemotherapy with single agent Alimta. He is status post 2 cycles. Patient was discussed with and also seen by Dr. [name]. The 2-D echocardiogram with contrast revealed an ejection fraction of 55-60%. These results were shared with the patient and his family members. Cycle #3 of his maintenance chemotherapy with single agent Alimta at 500 mg per meter squared given every 3 weeks his next scheduled for 05/14/2014. He is to continue taking Lasix 20 mg by mouth daily as needed for lower extremity edema. A refill for her the Lasix was sent to his pharmacy of record. He was given a refill prescription for his Percocet tablets a total of 30, 5/325 tablets with no refill. For depression we'll place patient on Remeron 15 mg by mouth each bedtime. He'll return in 2 weeks for another symptom management visit prior to cycle #3. He will be due for a restaging CT scan after  cycle #3.   He was advised to call immediately if she has any concerning  symptoms in the interval. All questions were answered.  The patient knows to call the clinic with any problems, questions or concerns. We can certainly see the patient much sooner if necessary.  Carlton Adam, PA-C  ADDENDUM: Hematology/Oncology Attending:  I had a face to face encounter with the patient. I recommended his care plan. This is a very pleasant 73 years old white male with metastatic non-small cell lung cancer, adenocarcinoma status post 6 cycles of induction chemotherapy and currently undergoing maintenance chemotherapy with single agent Alimta status post 2 cycles. His treatment has been on hold recently secondary to significant erythema questionable for cellulitis in addition to swelling of the lower extremities. His 2-D echo showed no significant abnormalities. The patient was started on treatment with Lasix and Augmentin for the last week and his swelling has significantly improved. He also complains of lack of appetite and no interest in any activity. I will start the patient on Remeron for depression. He would come back for follow up visit in 2 weeks for evaluation after repeating CT scan of the chest, abdomen and pelvis for restaging of his disease. He was advised to call immediately if he has any concerning symptoms in the interval.  Disclaimer: This note was dictated with voice recognition software. Similar sounding words can inadvertently be transcribed and may not be corrected upon review. [name], MD 05/06/2014

## 2014-05-01 NOTE — Telephone Encounter (Signed)
gv pt appt schedule june/july

## 2014-05-01 NOTE — Patient Instructions (Signed)
Continue Lasix 20-40 mg by mouth daily only as needed for lower extremity edema For depression you're being started on Remeron 15 mg by mouth at bedtime Followup in 2 weeks prior to her next scheduled cycle of maintenance chemotherapy

## 2014-05-02 ENCOUNTER — Encounter: Payer: Self-pay | Admitting: *Deleted

## 2014-05-14 ENCOUNTER — Encounter: Payer: Self-pay | Admitting: *Deleted

## 2014-05-14 ENCOUNTER — Ambulatory Visit (HOSPITAL_BASED_OUTPATIENT_CLINIC_OR_DEPARTMENT_OTHER): Payer: Medicare Other

## 2014-05-14 ENCOUNTER — Telehealth: Payer: Self-pay | Admitting: *Deleted

## 2014-05-14 ENCOUNTER — Ambulatory Visit (HOSPITAL_BASED_OUTPATIENT_CLINIC_OR_DEPARTMENT_OTHER): Payer: Medicare Other | Admitting: Nurse Practitioner

## 2014-05-14 ENCOUNTER — Other Ambulatory Visit (HOSPITAL_BASED_OUTPATIENT_CLINIC_OR_DEPARTMENT_OTHER): Payer: Medicare Other

## 2014-05-14 ENCOUNTER — Telehealth: Payer: Self-pay | Admitting: Internal Medicine

## 2014-05-14 VITALS — BP 127/55 | HR 72 | Temp 98.2°F | Resp 18 | Ht 72.0 in | Wt 163.5 lb

## 2014-05-14 DIAGNOSIS — C3490 Malignant neoplasm of unspecified part of unspecified bronchus or lung: Secondary | ICD-10-CM | POA: Diagnosis not present

## 2014-05-14 DIAGNOSIS — Z5111 Encounter for antineoplastic chemotherapy: Secondary | ICD-10-CM

## 2014-05-14 DIAGNOSIS — C3410 Malignant neoplasm of upper lobe, unspecified bronchus or lung: Secondary | ICD-10-CM

## 2014-05-14 DIAGNOSIS — C7970 Secondary malignant neoplasm of unspecified adrenal gland: Secondary | ICD-10-CM | POA: Diagnosis not present

## 2014-05-14 LAB — CBC WITH DIFFERENTIAL/PLATELET
BASO%: 0 % (ref 0.0–2.0)
Basophils Absolute: 0 10*3/uL (ref 0.0–0.1)
EOS%: 0 % (ref 0.0–7.0)
Eosinophils Absolute: 0 10*3/uL (ref 0.0–0.5)
HCT: 33.2 % — ABNORMAL LOW (ref 38.4–49.9)
HGB: 11.7 g/dL — ABNORMAL LOW (ref 13.0–17.1)
LYMPH%: 8.1 % — ABNORMAL LOW (ref 14.0–49.0)
MCH: 34.2 pg — AB (ref 27.2–33.4)
MCHC: 35.2 g/dL (ref 32.0–36.0)
MCV: 97.1 fL (ref 79.3–98.0)
MONO#: 0.5 10*3/uL (ref 0.1–0.9)
MONO%: 6 % (ref 0.0–14.0)
NEUT%: 85.9 % — ABNORMAL HIGH (ref 39.0–75.0)
NEUTROS ABS: 7 10*3/uL — AB (ref 1.5–6.5)
PLATELETS: 232 10*3/uL (ref 140–400)
RBC: 3.42 10*6/uL — AB (ref 4.20–5.82)
RDW: 14.5 % (ref 11.0–14.6)
WBC: 8.1 10*3/uL (ref 4.0–10.3)
lymph#: 0.7 10*3/uL — ABNORMAL LOW (ref 0.9–3.3)

## 2014-05-14 LAB — COMPREHENSIVE METABOLIC PANEL (CC13)
ALBUMIN: 3.6 g/dL (ref 3.5–5.0)
ALK PHOS: 101 U/L (ref 40–150)
ALT: 12 U/L (ref 0–55)
AST: 20 U/L (ref 5–34)
Anion Gap: 10 mEq/L (ref 3–11)
BUN: 13.6 mg/dL (ref 7.0–26.0)
CO2: 28 mEq/L (ref 22–29)
Calcium: 9.4 mg/dL (ref 8.4–10.4)
Chloride: 96 mEq/L — ABNORMAL LOW (ref 98–109)
Creatinine: 0.8 mg/dL (ref 0.7–1.3)
Glucose: 113 mg/dl (ref 70–140)
POTASSIUM: 4 meq/L (ref 3.5–5.1)
SODIUM: 134 meq/L — AB (ref 136–145)
TOTAL PROTEIN: 6.3 g/dL — AB (ref 6.4–8.3)
Total Bilirubin: 0.56 mg/dL (ref 0.20–1.20)

## 2014-05-14 MED ORDER — DEXAMETHASONE SODIUM PHOSPHATE 10 MG/ML IJ SOLN
10.0000 mg | Freq: Once | INTRAMUSCULAR | Status: AC
  Administered 2014-05-14: 10 mg via INTRAVENOUS

## 2014-05-14 MED ORDER — CYANOCOBALAMIN 1000 MCG/ML IJ SOLN
1000.0000 ug | Freq: Once | INTRAMUSCULAR | Status: AC
  Administered 2014-05-14: 1000 ug via INTRAMUSCULAR

## 2014-05-14 MED ORDER — ONDANSETRON 8 MG/NS 50 ML IVPB
INTRAVENOUS | Status: AC
  Filled 2014-05-14: qty 8

## 2014-05-14 MED ORDER — SODIUM CHLORIDE 0.9 % IV SOLN
Freq: Once | INTRAVENOUS | Status: AC
  Administered 2014-05-14: 14:00:00 via INTRAVENOUS

## 2014-05-14 MED ORDER — CYANOCOBALAMIN 1000 MCG/ML IJ SOLN
INTRAMUSCULAR | Status: AC
  Filled 2014-05-14: qty 1

## 2014-05-14 MED ORDER — SODIUM CHLORIDE 0.9 % IV SOLN
500.0000 mg/m2 | Freq: Once | INTRAVENOUS | Status: AC
  Administered 2014-05-14: 975 mg via INTRAVENOUS
  Filled 2014-05-14: qty 39

## 2014-05-14 MED ORDER — DEXAMETHASONE SODIUM PHOSPHATE 10 MG/ML IJ SOLN
INTRAMUSCULAR | Status: AC
  Filled 2014-05-14: qty 1

## 2014-05-14 MED ORDER — ONDANSETRON 8 MG/50ML IVPB (CHCC)
8.0000 mg | Freq: Once | INTRAVENOUS | Status: AC
  Administered 2014-05-14: 8 mg via INTRAVENOUS

## 2014-05-14 NOTE — Progress Notes (Signed)
05/14/14 Met briefly with Anthony Curry when he was in the cancer center this morning. Gave him the sealed envelope with a gift card for his participation in the [place] [id number] [place] interview study. Thanked him for his participation.

## 2014-05-14 NOTE — Progress Notes (Addendum)
  [facility] OFFICE PROGRESS NOTE   Diagnosis:  Non-small cell lung cancer on treatment with maintenance Alimta.  INTERVAL HISTORY:   Anthony Curry returns as scheduled. He completed cycle 2 maintenance Alimta on 04/02/2014. Treatment was then placed on hold secondary to significant erythema questionable for cellulitis in addition to swelling of the legs. 2-D echo showed no significant abnormalities. He was started on Lasix and Augmentin with improvement.  He notes further improvement in the leg swelling. He denies nausea/vomiting. No mouth sores. He tends to have loose stools for a few days after chemotherapy. He takes Imodium as needed. No skin rash. He denies shortness of breath, cough and fever. He has a good appetite. He has periodic back pain.  Objective:  Vital signs in last 24 hours:  Blood pressure 127/55, pulse 72, temperature 98.2 F (36.8 C), temperature source Oral, resp. rate 18, height 6' (1.829 m), weight 163 lb 8 oz (74.163 kg).    HEENT: No thrush or ulcerations. Lymphatics: No palpable cervical or supraclavicular lymph nodes. Resp: Lungs clear. Cardio: Regular cardiac rhythm. GI: Abdomen soft and nontender. No hepatomegaly. Vascular: Trace lower leg/ankle edema bilaterally right slightly greater than left (chronic right leg/ankle edema related to an old injury per patient report). Skin: no rash.    Lab Results:  Lab Results  Component Value Date   WBC 8.1 05/14/2014   HGB 11.7* 05/14/2014   HCT 33.2* 05/14/2014   MCV 97.1 05/14/2014   PLT 232 05/14/2014   NEUTROABS 7.0* 05/14/2014    Imaging:  No results found.  Medications: I have reviewed the patient's current medications.  Assessment/Plan: 1. Metastatic non-small cell lung cancer, adenocarcinoma, with negative EGFR mutation and negative ALK gene translocation status post 6 cycles of carboplatin/Alimta now on maintenance chemotherapy with single agent Alimta status post 2 cycles. 2. Recent  lower extremity edema/erythema with question of cellulitis status post Augmentin and Lasix with significant improvement. Unremarkable 2-D echo. 3. Depression recently started on Remeron.   Disposition: Anthony Curry appears stable. The leg swelling continues to be improved. Plan to resume single agent maintenance Alimta with cycle 3 today. Restaging CT evaluation prior to cycle 4. He will return for a followup visit in 3 weeks to review the results. He will contact the office in the interim with any problems.  Plan reviewed with Dr. [name].    Anthony Curry ANP/GNP-BC   05/14/2014  1:01 PM  ADDENDUM: Hematology/Oncology Attending:  I had a face to face encounter with the patient. I recommended his care plan. This is a very pleasant 73 years old white male with metastatic non-small cell lung cancer, adenocarcinoma status post 6 cycles of induction chemotherapy with carboplatin and Alimta and currently undergoing maintenance chemotherapy with single agent Alimta status post 2 cycles. His treatment has been delayed for the last few weeks secondary to significant edema of the lower extremity with questionable cellulitis which is completely resolved at this point. I recommended for the patient to proceed with cycle #3 today as scheduled. He would come back for follow up visit in 3 weeks after repeating CT scan of the chest, abdomen and pelvis for restaging of his disease. He was advised to call immediately if he has any concerning symptoms in the interval.  Disclaimer: This note was dictated with voice recognition software. Similar sounding words can inadvertently be transcribed and may not be corrected upon review. Eilleen Kempf., MD 05/17/2014

## 2014-05-14 NOTE — Telephone Encounter (Signed)
Gave pt appt for lab,md and chemo for July , emailed michelle regarding chemo

## 2014-05-14 NOTE — Patient Instructions (Signed)
[facility] Discharge Instructions for Patients Receiving Chemotherapy  Today you received the following chemotherapy agents: Alimta  To help prevent nausea and vomiting after your treatment, we encourage you to take your nausea medication: Compazine 10 mg every 6 hrs as needed.    If you develop nausea and vomiting that is not controlled by your nausea medication, call the clinic.   BELOW ARE SYMPTOMS THAT SHOULD BE REPORTED IMMEDIATELY:  *FEVER GREATER THAN 100.5 F  *CHILLS WITH OR WITHOUT FEVER  NAUSEA AND VOMITING THAT IS NOT CONTROLLED WITH YOUR NAUSEA MEDICATION  *UNUSUAL SHORTNESS OF BREATH  *UNUSUAL BRUISING OR BLEEDING  TENDERNESS IN MOUTH AND THROAT WITH OR WITHOUT PRESENCE OF ULCERS  *URINARY PROBLEMS  *BOWEL PROBLEMS  UNUSUAL RASH Items with * indicate a potential emergency and should be followed up as soon as possible.  Feel free to call the clinic you have any questions or concerns. The clinic phone number is [phone].

## 2014-05-14 NOTE — Telephone Encounter (Signed)
Per staff message and POF I have scheduled appts.  JMW  

## 2014-05-15 ENCOUNTER — Telehealth: Payer: Self-pay | Admitting: Internal Medicine

## 2014-05-15 NOTE — Telephone Encounter (Signed)
Talked to pt and he has all appt for june and July

## 2014-05-21 ENCOUNTER — Other Ambulatory Visit: Payer: Self-pay | Admitting: Medical Oncology

## 2014-05-21 ENCOUNTER — Telehealth: Payer: Self-pay | Admitting: Medical Oncology

## 2014-05-21 DIAGNOSIS — L0390 Cellulitis, unspecified: Secondary | ICD-10-CM

## 2014-05-21 MED ORDER — AMOXICILLIN-POT CLAVULANATE 875-125 MG PO TABS: 1.0000 | ORAL_TABLET | Freq: Two times a day (BID) | ORAL | Status: DC

## 2014-05-21 NOTE — Telephone Encounter (Signed)
Wife calls and reports pts left foot and ankle is swollen again , non tender. He can get his sandal on his left foot. Completed amoxicillin a month ago.  He is taking lasix. Per Dr [name] rx sent to pharmacy and wife notified of new rx and to call for worsening symptoms/signs

## 2014-05-26 ENCOUNTER — Other Ambulatory Visit: Payer: Medicare Other

## 2014-05-27 ENCOUNTER — Telehealth: Payer: Self-pay | Admitting: *Deleted

## 2014-05-27 ENCOUNTER — Other Ambulatory Visit: Payer: Self-pay | Admitting: *Deleted

## 2014-05-27 DIAGNOSIS — C3430 Malignant neoplasm of lower lobe, unspecified bronchus or lung: Secondary | ICD-10-CM

## 2014-05-27 NOTE — Telephone Encounter (Signed)
Pt's wife called and left a msg stating he has continued swelling in his left leg, with some redness and pitting edema.  He has 2 days left of the antibiotic.  Per Dr Vista Mink okay to make referral to vascular MD.  Left message with pt's wife stating that we will make the referral.

## 2014-05-30 ENCOUNTER — Telehealth: Payer: Self-pay | Admitting: Internal Medicine

## 2014-05-30 NOTE — Telephone Encounter (Signed)
Talked to pt, he is aware of appt with Dr Donnetta Hutching, vascular surgeon @ [facility], on 06/10/14 1045am

## 2014-06-02 ENCOUNTER — Encounter (HOSPITAL_COMMUNITY): Payer: Self-pay

## 2014-06-02 ENCOUNTER — Other Ambulatory Visit (HOSPITAL_BASED_OUTPATIENT_CLINIC_OR_DEPARTMENT_OTHER): Payer: Medicare Other

## 2014-06-02 ENCOUNTER — Ambulatory Visit (HOSPITAL_COMMUNITY)
Admission: RE | Admit: 2014-06-02 | Discharge: 2014-06-02 | Disposition: A | Discharge status: Home / Self Care | Payer: Medicare Other | Source: Ambulatory Visit | Attending: Nurse Practitioner | Admitting: Nurse Practitioner

## 2014-06-02 DIAGNOSIS — K7689 Other specified diseases of liver: Secondary | ICD-10-CM | POA: Diagnosis not present

## 2014-06-02 DIAGNOSIS — J438 Other emphysema: Secondary | ICD-10-CM | POA: Diagnosis not present

## 2014-06-02 DIAGNOSIS — M5137 Other intervertebral disc degeneration, lumbosacral region: Secondary | ICD-10-CM | POA: Diagnosis not present

## 2014-06-02 DIAGNOSIS — E279 Disorder of adrenal gland, unspecified: Secondary | ICD-10-CM | POA: Insufficient documentation

## 2014-06-02 DIAGNOSIS — C3490 Malignant neoplasm of unspecified part of unspecified bronchus or lung: Secondary | ICD-10-CM

## 2014-06-02 DIAGNOSIS — J90 Pleural effusion, not elsewhere classified: Secondary | ICD-10-CM | POA: Diagnosis not present

## 2014-06-02 DIAGNOSIS — I10 Essential (primary) hypertension: Secondary | ICD-10-CM | POA: Diagnosis not present

## 2014-06-02 DIAGNOSIS — K8020 Calculus of gallbladder without cholecystitis without obstruction: Secondary | ICD-10-CM | POA: Insufficient documentation

## 2014-06-02 DIAGNOSIS — K5730 Diverticulosis of large intestine without perforation or abscess without bleeding: Secondary | ICD-10-CM | POA: Insufficient documentation

## 2014-06-02 DIAGNOSIS — N62 Hypertrophy of breast: Secondary | ICD-10-CM | POA: Diagnosis not present

## 2014-06-02 DIAGNOSIS — I2510 Atherosclerotic heart disease of native coronary artery without angina pectoris: Secondary | ICD-10-CM | POA: Diagnosis not present

## 2014-06-02 DIAGNOSIS — Z9221 Personal history of antineoplastic chemotherapy: Secondary | ICD-10-CM | POA: Diagnosis not present

## 2014-06-02 DIAGNOSIS — M51379 Other intervertebral disc degeneration, lumbosacral region without mention of lumbar back pain or lower extremity pain: Secondary | ICD-10-CM | POA: Insufficient documentation

## 2014-06-02 LAB — CBC WITH DIFFERENTIAL/PLATELET
BASO%: 1.1 % (ref 0.0–2.0)
Basophils Absolute: 0 10*3/uL (ref 0.0–0.1)
EOS%: 2.6 % (ref 0.0–7.0)
Eosinophils Absolute: 0.1 10*3/uL (ref 0.0–0.5)
HCT: 34.6 % — ABNORMAL LOW (ref 38.4–49.9)
HGB: 11.9 g/dL — ABNORMAL LOW (ref 13.0–17.1)
LYMPH%: 24 % (ref 14.0–49.0)
MCH: 35.3 pg — AB (ref 27.2–33.4)
MCHC: 34.5 g/dL (ref 32.0–36.0)
MCV: 102.3 fL — AB (ref 79.3–98.0)
MONO#: 0.5 10*3/uL (ref 0.1–0.9)
MONO%: 14.1 % — AB (ref 0.0–14.0)
NEUT#: 2 10*3/uL (ref 1.5–6.5)
NEUT%: 58.2 % (ref 39.0–75.0)
Platelets: 303 10*3/uL (ref 140–400)
RBC: 3.38 10*6/uL — ABNORMAL LOW (ref 4.20–5.82)
RDW: 16.5 % — AB (ref 11.0–14.6)
WBC: 3.5 10*3/uL — ABNORMAL LOW (ref 4.0–10.3)
lymph#: 0.8 10*3/uL — ABNORMAL LOW (ref 0.9–3.3)

## 2014-06-02 LAB — COMPREHENSIVE METABOLIC PANEL (CC13)
ALK PHOS: 89 U/L (ref 40–150)
ALT: 16 U/L (ref 0–55)
AST: 25 U/L (ref 5–34)
Albumin: 3.4 g/dL — ABNORMAL LOW (ref 3.5–5.0)
Anion Gap: 10 mEq/L (ref 3–11)
BUN: 7.2 mg/dL (ref 7.0–26.0)
CALCIUM: 9.4 mg/dL (ref 8.4–10.4)
CO2: 26 mEq/L (ref 22–29)
CREATININE: 0.9 mg/dL (ref 0.7–1.3)
Chloride: 93 mEq/L — ABNORMAL LOW (ref 98–109)
Glucose: 111 mg/dl (ref 70–140)
POTASSIUM: 4.1 meq/L (ref 3.5–5.1)
Sodium: 129 mEq/L — ABNORMAL LOW (ref 136–145)
Total Bilirubin: 0.6 mg/dL (ref 0.20–1.20)
Total Protein: 6.1 g/dL — ABNORMAL LOW (ref 6.4–8.3)

## 2014-06-02 MED ORDER — IOHEXOL 300 MG/ML  SOLN
100.0000 mL | Freq: Once | INTRAMUSCULAR | Status: AC | PRN
  Administered 2014-06-02: 100 mL via INTRAVENOUS

## 2014-06-04 ENCOUNTER — Ambulatory Visit (HOSPITAL_BASED_OUTPATIENT_CLINIC_OR_DEPARTMENT_OTHER): Payer: Medicare Other

## 2014-06-04 ENCOUNTER — Telehealth: Payer: Self-pay | Admitting: Internal Medicine

## 2014-06-04 ENCOUNTER — Ambulatory Visit (HOSPITAL_BASED_OUTPATIENT_CLINIC_OR_DEPARTMENT_OTHER): Payer: Medicare Other | Admitting: Internal Medicine

## 2014-06-04 ENCOUNTER — Encounter: Payer: Self-pay | Admitting: Internal Medicine

## 2014-06-04 ENCOUNTER — Other Ambulatory Visit (HOSPITAL_BASED_OUTPATIENT_CLINIC_OR_DEPARTMENT_OTHER): Payer: Medicare Other

## 2014-06-04 ENCOUNTER — Telehealth: Payer: Self-pay | Admitting: *Deleted

## 2014-06-04 VITALS — HR 74

## 2014-06-04 VITALS — BP 107/55 | HR 120 | Temp 97.7°F | Resp 18 | Ht 72.0 in | Wt 161.5 lb

## 2014-06-04 DIAGNOSIS — Z5111 Encounter for antineoplastic chemotherapy: Secondary | ICD-10-CM | POA: Diagnosis not present

## 2014-06-04 DIAGNOSIS — C3490 Malignant neoplasm of unspecified part of unspecified bronchus or lung: Secondary | ICD-10-CM

## 2014-06-04 DIAGNOSIS — C3430 Malignant neoplasm of lower lobe, unspecified bronchus or lung: Secondary | ICD-10-CM

## 2014-06-04 LAB — CBC WITH DIFFERENTIAL/PLATELET
BASO%: 0.3 % (ref 0.0–2.0)
BASOS ABS: 0 10*3/uL (ref 0.0–0.1)
EOS ABS: 0 10*3/uL (ref 0.0–0.5)
EOS%: 0.2 % (ref 0.0–7.0)
HCT: 36.6 % — ABNORMAL LOW (ref 38.4–49.9)
HEMOGLOBIN: 12.3 g/dL — AB (ref 13.0–17.1)
LYMPH%: 22.1 % (ref 14.0–49.0)
MCH: 35.1 pg — AB (ref 27.2–33.4)
MCHC: 33.7 g/dL (ref 32.0–36.0)
MCV: 104.3 fL — AB (ref 79.3–98.0)
MONO#: 0.1 10*3/uL (ref 0.1–0.9)
MONO%: 2.3 % (ref 0.0–14.0)
NEUT#: 1.8 10*3/uL (ref 1.5–6.5)
NEUT%: 75.1 % — ABNORMAL HIGH (ref 39.0–75.0)
PLATELETS: 343 10*3/uL (ref 140–400)
RBC: 3.51 10*6/uL — ABNORMAL LOW (ref 4.20–5.82)
RDW: 17.2 % — AB (ref 11.0–14.6)
WBC: 2.4 10*3/uL — ABNORMAL LOW (ref 4.0–10.3)
lymph#: 0.5 10*3/uL — ABNORMAL LOW (ref 0.9–3.3)

## 2014-06-04 LAB — COMPREHENSIVE METABOLIC PANEL (CC13)
ALK PHOS: 89 U/L (ref 40–150)
ALT: 16 U/L (ref 0–55)
AST: 20 U/L (ref 5–34)
Albumin: 3.4 g/dL — ABNORMAL LOW (ref 3.5–5.0)
Anion Gap: 13 mEq/L — ABNORMAL HIGH (ref 3–11)
BUN: 10.4 mg/dL (ref 7.0–26.0)
CO2: 20 mEq/L — ABNORMAL LOW (ref 22–29)
Calcium: 9.2 mg/dL (ref 8.4–10.4)
Chloride: 91 mEq/L — ABNORMAL LOW (ref 98–109)
Creatinine: 1 mg/dL (ref 0.7–1.3)
Glucose: 212 mg/dl — ABNORMAL HIGH (ref 70–140)
POTASSIUM: 4.2 meq/L (ref 3.5–5.1)
Sodium: 125 mEq/L — ABNORMAL LOW (ref 136–145)
Total Bilirubin: 0.66 mg/dL (ref 0.20–1.20)
Total Protein: 6.1 g/dL — ABNORMAL LOW (ref 6.4–8.3)

## 2014-06-04 MED ORDER — PEMETREXED DISODIUM CHEMO INJECTION 500 MG
500.0000 mg/m2 | Freq: Once | INTRAVENOUS | Status: AC
  Administered 2014-06-04: 975 mg via INTRAVENOUS
  Filled 2014-06-04: qty 39

## 2014-06-04 MED ORDER — ONDANSETRON 8 MG/NS 50 ML IVPB
INTRAVENOUS | Status: AC
  Filled 2014-06-04: qty 8

## 2014-06-04 MED ORDER — ONDANSETRON 8 MG/50ML IVPB (CHCC)
8.0000 mg | Freq: Once | INTRAVENOUS | Status: AC
Start: 2014-06-04 — End: 2014-06-04
  Administered 2014-06-04: 8 mg via INTRAVENOUS

## 2014-06-04 MED ORDER — DEXAMETHASONE SODIUM PHOSPHATE 10 MG/ML IJ SOLN
INTRAMUSCULAR | Status: AC
  Filled 2014-06-04: qty 1

## 2014-06-04 MED ORDER — SODIUM CHLORIDE 0.9 % IV SOLN
Freq: Once | INTRAVENOUS | Status: AC
  Administered 2014-06-04: 13:00:00 via INTRAVENOUS

## 2014-06-04 MED ORDER — DEXAMETHASONE SODIUM PHOSPHATE 10 MG/ML IJ SOLN
10.0000 mg | Freq: Once | INTRAMUSCULAR | Status: AC
  Administered 2014-06-04: 10 mg via INTRAVENOUS

## 2014-06-04 NOTE — Patient Instructions (Signed)
[facility] Discharge Instructions for Patients Receiving Chemotherapy  Today you received the following chemotherapy agents:  Alimta  To help prevent nausea and vomiting after your treatment, we encourage you to take your nausea medication as ordered per MD.   If you develop nausea and vomiting that is not controlled by your nausea medication, call the clinic.   BELOW ARE SYMPTOMS THAT SHOULD BE REPORTED IMMEDIATELY:  *FEVER GREATER THAN 100.5 F  *CHILLS WITH OR WITHOUT FEVER  NAUSEA AND VOMITING THAT IS NOT CONTROLLED WITH YOUR NAUSEA MEDICATION  *UNUSUAL SHORTNESS OF BREATH  *UNUSUAL BRUISING OR BLEEDING  TENDERNESS IN MOUTH AND THROAT WITH OR WITHOUT PRESENCE OF ULCERS  *URINARY PROBLEMS  *BOWEL PROBLEMS  UNUSUAL RASH Items with * indicate a potential emergency and should be followed up as soon as possible.  Feel free to call the clinic you have any questions or concerns. The clinic phone number is [phone].

## 2014-06-04 NOTE — Telephone Encounter (Signed)
Per staff message and POF I have scheduled appts. Advised scheduler of appts. JMW  

## 2014-06-04 NOTE — Progress Notes (Signed)
[facility] [phone]   [phone]) [place], [place] [phone]  DIAGNOSIS: Stage IV (T1 A., N0, M1 B.) non-small cell lung cancer, adenocarcinoma with negative EGFR mutation and negative ALK gene translocation diagnosed in October of 2014.   Molecular profile: Negative for RET, ALK, BRAF, MET, EGFR.  Positive for ERBB2 A775T, [id number], KRAS G13E, MAP2K1 D67N (see full report).   PRIOR THERAPY: Systemic chemotherapy with carboplatin for AUC of 5 and Alimta 500 mg/M2 every 3 weeks, status post 6 cycles with stable disease. First dose on 10/23/2013.[name]   CURRENT THERAPY: Maintenance systemic chemotherapy with single agent Alimta 500 mg/M2 every 3 weeks. Status post 3 cycles.  CHEMOTHERAPY INTENT: Palliative  CURRENT # OF CHEMOTHERAPY CYCLES: 4 CURRENT ANTIEMETICS: Zofran, Compazine and dexamethasone  CURRENT SMOKING STATUS: Former smoker  ORAL CHEMOTHERAPY AND CONSENT: None  CURRENT BISPHOSPHONATES USE: None  PAIN MANAGEMENT: 0/10  NARCOTICS INDUCED CONSTIPATION: None  LIVING WILL AND CODE STATUS: ?   INTERVAL HISTORY: Anthony Curry 73 y.o. male returns to the clinic today for followup visit accompanied by his wife and daughter. The patient is feeling fine today with no specific complaints except for fatigue. The swelling of his lower extremity has significantly improved. He is scheduled to see a vascular surgeon soon for evaluation of this and recurrent condition of swelling and erythema of the lower extremities. He denied having any significant chest pain, shortness of breath, cough or hemoptysis. He has no significant weight loss or night sweats. He tolerated the last cycle of her systemic chemotherapy fairly well. He had repeat CT scan of the chest, abdomen and is performed recently and he is here today for evaluation and discussion of his scan results.  MEDICAL  HISTORY: Past Medical History  Diagnosis Date  . Hypertension   . GERD (gastroesophageal reflux disease)   . Alcohol abuse   . 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 Low sodium     ALLERGIES:  has No Known Allergies.  MEDICATIONS:  Current Outpatient Prescriptions  Medication Sig Dispense Refill  . ALPRAZolam (XANAX) 0.25 MG tablet Take 0.25 mg by mouth 3 (three) times daily as needed for anxiety or sleep.      [name] amLODipine (NORVASC) 10 MG tablet Take 10 mg by mouth daily.       [name] amoxicillin-clavulanate (AUGMENTIN) 875-125 MG per tablet Take 1 tablet by mouth 2 (two) times daily.  14 tablet  0  . Ascorbic Acid (VITAMIN C PO) Take 1,000 mg by mouth daily.       [name] atenolol (TENORMIN) 100 MG tablet Take 100 mg by mouth daily.       . B Complex-C (SUPER B COMPLEX PO) Take 1 each by mouth.      . Biotin 7500 MCG TABS Take 1 tablet by mouth.      . calcium carbonate (OS-CAL) 600 MG TABS Take 600 mg by mouth daily with breakfast.      . Cholecalciferol (VITAMIN D-3) 5000 UNITS TABS Take 5,000 Units by mouth daily.       [name] demeclocycline (DECLOMYCIN) 150 MG tablet Take 150 mg by mouth 2 (two) times daily.      [name] dexamethasone (DECADRON) 4 MG tablet 4 milligrams by mouth twice a day the day before, day of and day after the chemotherapy every 3 weeks.  40 tablet  1  . feeding supplement (ENSURE COMPLETE)  LIQD Take 237 mLs by mouth daily.  30 Bottle  0  . folic acid (FOLVITE) 1 MG tablet Take 1 tablet (1 mg total) by mouth daily.  30 tablet  6  . furosemide (LASIX) 20 MG tablet Take 1 to 2 tablets by mouth daily as  Needed for lower extremity swelling  30 tablet  0  . HYDROcodone-acetaminophen (NORCO/VICODIN) 5-325 MG per tablet Take 1-2 tablets by mouth every 4 (four) hours as needed.  30 tablet  0  . Lactobacillus (ACIDOPHILUS PO) Take 1 each by mouth.      [name] MAGNESIUM PO Take 400 mg by mouth daily.       . mirtazapine (REMERON) 15 MG tablet Take 1 tablet (15 mg total) by mouth at  bedtime.  30 tablet  3  . Multiple Vitamin (MULTIVITAMIN WITH MINERALS) TABS Take 1 tablet by mouth daily.      [name] olmesartan (BENICAR) 40 MG tablet Take 40 mg by mouth daily.       . Omega-3 Fatty Acids (FISH OIL PO) Take 1 tablet by mouth daily.      [name] omeprazole (PRILOSEC) 20 MG capsule Take 20 mg by mouth daily.       [name] OVER THE COUNTER MEDICATION Inject 1 Units into the vein daily.      . Potassium 99 MG TABS Take 1 tablet by mouth daily.      . prochlorperazine (COMPAZINE) 10 MG tablet Take 1 tablet (10 mg total) by mouth every 6 (six) hours as needed for nausea or vomiting.  60 tablet  0  . tamsulosin (FLOMAX) 0.4 MG CAPS capsule Take 0.4 mg by mouth 2 (two) times daily.      [name] thiamine (VITAMIN B-1) 100 MG tablet Take 1 tablet (100 mg total) by mouth daily.  30 tablet  6  . vitamin E 400 UNIT capsule Take 400 Units by mouth daily.       No current facility-administered medications for this visit.    SURGICAL HISTORY:  Past Surgical History  Procedure Laterality Date  . Hemorrhoid surgery    . Ankle arthrodesis  1995    rt fx-hardware in  . Tonsillectomy    . Colonoscopy    . Excision / curettage bone cyst finger  2005    rt thumb  . Total shoulder arthroplasty  02/03/2012    Procedure: TOTAL SHOULDER ARTHROPLASTY;  Surgeon: [name], MD;  Location: [facility];  Service: Orthopedics;  Laterality: Left;    REVIEW OF SYSTEMS:  Constitutional: positive for fatigue Eyes: negative Ears, nose, mouth, throat, and face: negative Respiratory: negative Cardiovascular: negative Gastrointestinal: negative Genitourinary:negative Integument/breast: negative Hematologic/lymphatic: negative Musculoskeletal:negative Neurological: negative Behavioral/Psych: negative Endocrine: negative Allergic/Immunologic: negative   PHYSICAL EXAMINATION: General appearance: alert, cooperative, fatigued and no distress Head: Normocephalic, without obvious abnormality,  atraumatic Neck: no adenopathy, no JVD, supple, symmetrical, trachea midline and thyroid not enlarged, symmetric, no tenderness/mass/nodules Lymph nodes: Cervical, supraclavicular, and axillary nodes normal. Resp: clear to auscultation bilaterally Back: symmetric, no curvature. ROM normal. No CVA tenderness. Cardio: regular rate and rhythm, S1, S2 normal, no murmur, click, rub or gallop GI: soft, non-tender; bowel sounds normal; no masses,  no organomegaly Extremities: extremities normal, atraumatic, no cyanosis or edema Neurologic: Alert and oriented X 3, normal strength and tone. Normal symmetric reflexes. Normal coordination and gait  ECOG PERFORMANCE STATUS: 1 - Symptomatic but completely ambulatory  Blood pressure 107/55, pulse 120, temperature 97.7 F (36.5 C), temperature source Oral,  resp. rate 18, height 6' (1.829 m), weight 161 lb 8 oz (73.256 kg).  LABORATORY DATA: Lab Results  Component Value Date   WBC 2.4* 06/04/2014   HGB 12.3* 06/04/2014   HCT 36.6* 06/04/2014   MCV 104.3* 06/04/2014   PLT 343 06/04/2014      Chemistry      Component Value Date/Time   NA 125* 06/04/2014 1035   NA 122* 04/10/2014 2205   K 4.2 06/04/2014 1035   K 4.7 04/10/2014 2205   CL 85* 04/10/2014 2205   CO2 20* 06/04/2014 1035   CO2 24 04/10/2014 2205   BUN 10.4 06/04/2014 1035   BUN 20 04/10/2014 2205   CREATININE 1.0 06/04/2014 1035   CREATININE 1.02 04/10/2014 2205      Component Value Date/Time   CALCIUM 9.2 06/04/2014 1035   CALCIUM 8.9 04/10/2014 2205   ALKPHOS 89 06/04/2014 1035   ALKPHOS 67 04/10/2014 2205   AST 20 06/04/2014 1035   AST 31 04/10/2014 2205   ALT 16 06/04/2014 1035   ALT 16 04/10/2014 2205   BILITOT 0.66 06/04/2014 1035   BILITOT 0.4 04/10/2014 2205       RADIOGRAPHIC STUDIES: Ct Chest W Contrast  06/02/2014   CLINICAL DATA:  Lung cancer, ongoing chemotherapy, hypertension  EXAM: CT CHEST, ABDOMEN, AND PELVIS WITH CONTRAST  TECHNIQUE: Multidetector CT imaging of the chest, abdomen and pelvis was  performed following the standard protocol during bolus administration of intravenous contrast. Sagittal and coronal MPR images reconstructed from axial data set.  CONTRAST:  122m OMNIPAQUE IOHEXOL 300 MG/ML SOLN IV. Dilute oral contrast.  COMPARISON:  02/24/2014  FINDINGS:   CT CHEST FINDINGS  Asymmetric gynecomastia greater on RIGHT, mass not excluded.  Scattered atherosclerotic calcifications aorta and coronary arteries.  Vascular structures grossly patent on nondedicated exam.  Small RIGHT pleural effusion, new.  Fat attenuation nodule inferior to RIGHT coracoid process, 18 x 17 x 19 mm with a higher attenuation rim, question fat attenuation body at the subcoracoid recess versus a lipoma of osseous origin arising from the inferior margin of the coracoid process on coronal images.  Advanced degenerative changes RIGHT glenohumeral joint with prior LEFT shoulder athropalsty.  No thoracic adenopathy.  Stellate nodule LEFT apex 17 x 10 x 19 mm, little changed.  Emphysematous changes with compressive atelectasis RIGHT lower lobe.  Scattered patchy areas of non solid/ground-glass opacity in both lungs similar to previous exam. Additional focal opacity in anterior LEFT upper lobe 12 x 886mimage 27, previously 15 x 8 mm.  Posterior RIGHT upper lobe opacity 18 x 15 mm, previously 17 x 20 mm.  3 mm RIGHT lower lobe nodule image 55 unchanged.  Additional RIGHT upper lobe area of irregular density and air approximately 27 x 14 mm, previously 31 x 15 mm.  Large pleural calcification posterior LEFT lung base image 64 stable.  No new infiltrate, mass, nodule, or pneumothorax.   CT ABDOMEN AND PELVIS FINDINGS  Hypervascular nodule anterior aspect LEFT lobe liver 12 x 9 mm image 58 unchanged.  5 mm hypervascular nodule superior RIGHT lobe liver image 58 unchanged.  Question tiny cyst RIGHT lobe liver adjacent to gallbladder fossa image 73, stable.  Remainder of liver, spleen, pancreas, kidneys, and RIGHT adrenal gland normal.   LEFT adrenal mass 2.5 x 2.4 cm image 71, 2.6 x 2.2 cm by my measurements on previous exam. Dependent gallstones.  Scattered atherosclerotic calcification.  Descending and sigmoid diverticulosis without diverticulitis.  Stomach and bowel loops otherwise  unremarkable.  Normal appendix, ureters and bladder.  No mass, adenopathy, free fluid or free air.  Multilevel degenerative disc and facet disease changes lumbar spine.    IMPRESSION: Stable hypervascular nodules within liver unchanged since 10/15/2011.  Multiple areas of ground-glass opacity in both lungs, nonspecific, as well as stellate density at the LEFT apex and multiple additional irregular parenchymal abnormalities in both lungs, all stable since previous exam ; recommend continued surveillance.  New small RIGHT pleural effusion and RIGHT basilar atelectasis.  Stable hypervascular nodules within the liver.  Stable LEFT adrenal mass.  Cholelithiasis.  Distal colonic diverticulosis.   Electronically Signed   By: Lavonia Dana M.D.   On: 06/02/2014 16:04   ASSESSMENT AND PLAN: This is a very pleasant 73 years old white male with stage IV non-small cell lung cancer, adenocarcinoma status post induction chemotherapy was carboplatin and Alimta and currently on maintenance treatment with single agent Alimta status post 3 cycles. He tolerated the last cycle of his treatment fairly well with no significant adverse effects. His recent CT scan of the chest, abdomen and pelvis showed no evidence for disease progression. I discussed the scan results with the patient and his family. I recommended for him to proceed with cycle #4 today as scheduled. For the hypokalemia, I advised the patient to restrict his fluid intake and also to decrease the amount of beer that he drinks. He is also encouraged to slightly increase his salt intake. He would come back for followup visit in 3 weeks with the start of cycle #5. He was advised to call immediately if he has any concerning  symptoms in the interval. The patient voices understanding of current disease status and treatment options and is in agreement with the current care plan.  All questions were answered. The patient knows to call the clinic with any problems, questions or concerns. We can certainly see the patient much sooner if necessary.  I spent 15 minutes counseling the patient face to face. The total time spent in the appointment was 25 minutes.  Disclaimer: This note was dictated with voice recognition software. Similar sounding words can inadvertently be transcribed and may not be corrected upon review.

## 2014-06-04 NOTE — Telephone Encounter (Signed)
Gave pt appt for lab and Md, emailed michelle regarding chemo

## 2014-06-05 ENCOUNTER — Telehealth: Payer: Self-pay | Admitting: Internal Medicine

## 2014-06-05 ENCOUNTER — Telehealth: Payer: Self-pay | Admitting: *Deleted

## 2014-06-05 NOTE — Telephone Encounter (Signed)
Per staff phone call I have adjusted 7/22 appt

## 2014-06-05 NOTE — Telephone Encounter (Signed)
Talked to pt and gave new time on 7/22

## 2014-06-09 ENCOUNTER — Encounter: Payer: Self-pay | Admitting: Vascular Surgery

## 2014-06-10 ENCOUNTER — Encounter: Payer: Self-pay | Admitting: Vascular Surgery

## 2014-06-10 ENCOUNTER — Other Ambulatory Visit: Payer: Self-pay | Admitting: Vascular Surgery

## 2014-06-10 ENCOUNTER — Ambulatory Visit (HOSPITAL_COMMUNITY)
Admission: RE | Admit: 2014-06-10 | Discharge: 2014-06-10 | Disposition: A | Discharge status: Home / Self Care | Payer: Medicare Other | Source: Ambulatory Visit | Attending: Vascular Surgery | Admitting: Vascular Surgery

## 2014-06-10 ENCOUNTER — Ambulatory Visit (INDEPENDENT_AMBULATORY_CARE_PROVIDER_SITE_OTHER): Payer: Medicare Other | Admitting: Vascular Surgery

## 2014-06-10 ENCOUNTER — Other Ambulatory Visit: Payer: Self-pay

## 2014-06-10 VITALS — BP 135/64 | HR 63 | Resp 18 | Ht 72.0 in | Wt 161.2 lb

## 2014-06-10 DIAGNOSIS — R609 Edema, unspecified: Secondary | ICD-10-CM

## 2014-06-10 DIAGNOSIS — M7989 Other specified soft tissue disorders: Secondary | ICD-10-CM

## 2014-06-10 NOTE — Progress Notes (Signed)
Patient name: Anthony Curry MRN: [id number] DOB: [birth year] Sex: male   Referred by: Alyson Ingles  Reason for referral:  Chief Complaint  Patient presents with  . New Evaluation    LE edema, redness,   currently on Chemo    HISTORY OF PRESENT ILLNESS: Patient has today for evaluation of bilateral lower extremity swelling and erythema. He does have stage IV non-small cell lung cancer. He reports several episodes of erythema and swelling of both feet. This responded to antibiotic but continues to have some scaling of his skin. He does not have any history of DVT or superficial thrombus. No history of arterial insufficiency  Past Medical History  Diagnosis Date  . Hypertension   . GERD (gastroesophageal reflux disease)   . Alcohol abuse   . 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 Low sodium   . Cancer     small cell/lung    Past Surgical History  Procedure Laterality Date  . Hemorrhoid surgery    . Ankle arthrodesis  1995    rt fx-hardware in  . Tonsillectomy    . Colonoscopy    . Excision / curettage bone cyst finger  2005    rt thumb  . Total shoulder arthroplasty  02/03/2012    Procedure: TOTAL SHOULDER ARTHROPLASTY;  Surgeon: [name], MD;  Location: [facility];  Service: Orthopedics;  Laterality: Left;    History   Social History  . Marital Status: Married    Spouse Name: N/A    Number of Children: N/A  . Years of Education: N/A   Occupational History  . retired    Social History Main Topics  . Smoking status: Former Smoker -- 2.00 packs/day for 30 years    Types: Cigarettes    Quit date: 01/27/1984  . Smokeless tobacco: Not on file  . Alcohol Use: No  . Drug Use: No  . Sexual Activity: Not on file   Other Topics Concern  . Not on file   Social History Narrative  . No narrative on file    Family History  Problem Relation Age of Onset  . Brain cancer Brother   . Lung cancer Mother   . Hypertension Father   . Diabetes  Paternal Grandmother     Allergies as of 06/10/2014  . (No Known Allergies)    Current Outpatient Prescriptions on File Prior to Visit  Medication Sig Dispense Refill  . ALPRAZolam (XANAX) 0.25 MG tablet Take 0.25 mg by mouth 3 (three) times daily as needed for anxiety or sleep.      [name] amLODipine (NORVASC) 10 MG tablet Take 10 mg by mouth daily.       [name] amoxicillin-clavulanate (AUGMENTIN) 875-125 MG per tablet Take 1 tablet by mouth 2 (two) times daily.  14 tablet  0  . Ascorbic Acid (VITAMIN C PO) Take 1,000 mg by mouth daily.       [name] atenolol (TENORMIN) 100 MG tablet Take 100 mg by mouth daily.       . B Complex-C (SUPER B COMPLEX PO) Take 1 each by mouth.      . Biotin 7500 MCG TABS Take 1 tablet by mouth.      . calcium carbonate (OS-CAL) 600 MG TABS Take 600 mg by mouth daily with breakfast.      . Cholecalciferol (VITAMIN D-3) 5000 UNITS TABS Take 5,000 Units by mouth daily.       [name] demeclocycline (DECLOMYCIN) 150 MG tablet Take 150  mg by mouth 2 (two) times daily.      [name] dexamethasone (DECADRON) 4 MG tablet 4 milligrams by mouth twice a day the day before, day of and day after the chemotherapy every 3 weeks.  40 tablet  1  . feeding supplement (ENSURE COMPLETE) LIQD Take 237 mLs by mouth daily.  30 Bottle  0  . folic acid (FOLVITE) 1 MG tablet Take 1 tablet (1 mg total) by mouth daily.  30 tablet  6  . furosemide (LASIX) 20 MG tablet Take 1 to 2 tablets by mouth daily as  Needed for lower extremity swelling  30 tablet  0  . HYDROcodone-acetaminophen (NORCO/VICODIN) 5-325 MG per tablet Take 1-2 tablets by mouth every 4 (four) hours as needed.  30 tablet  0  . Lactobacillus (ACIDOPHILUS PO) Take 1 each by mouth.      [name] MAGNESIUM PO Take 400 mg by mouth daily.       . mirtazapine (REMERON) 15 MG tablet Take 1 tablet (15 mg total) by mouth at bedtime.  30 tablet  3  . Multiple Vitamin (MULTIVITAMIN WITH MINERALS) TABS Take 1 tablet by mouth daily.      [name] olmesartan (BENICAR) 40 MG  tablet Take 40 mg by mouth daily.       . Omega-3 Fatty Acids (FISH OIL PO) Take 1 tablet by mouth daily.      [name] omeprazole (PRILOSEC) 20 MG capsule Take 20 mg by mouth daily.       [name] OVER THE COUNTER MEDICATION Inject 1 Units into the vein daily.      . Potassium 99 MG TABS Take 1 tablet by mouth daily.      . prochlorperazine (COMPAZINE) 10 MG tablet Take 1 tablet (10 mg total) by mouth every 6 (six) hours as needed for nausea or vomiting.  60 tablet  0  . tamsulosin (FLOMAX) 0.4 MG CAPS capsule Take 0.4 mg by mouth 2 (two) times daily.      [name] thiamine (VITAMIN B-1) 100 MG tablet Take 1 tablet (100 mg total) by mouth daily.  30 tablet  6  . vitamin E 400 UNIT capsule Take 400 Units by mouth daily.       No current facility-administered medications on file prior to visit.     REVIEW OF SYSTEMS:  Positives indicated with an "X"  CARDIOVASCULAR:  [ ]  chest pain   [ ]  chest pressure   [ ]  palpitations   [ ]  orthopnea   [ ]  dyspnea on exertion   [ ]  claudication   [ ]  rest pain   [ ]  DVT   [ ]  phlebitis PULMONARY:   [ ]  productive cough   [ ]  asthma   [ ]  wheezing NEUROLOGIC:   [ ]  weakness  [ ]  paresthesias  [ ]  aphasia  [ ]  amaurosis  [ ]  dizziness HEMATOLOGIC:   [ ]  bleeding problems   [ ]  clotting disorders MUSCULOSKELETAL:  [ ]  joint pain   [ ]  joint swelling GASTROINTESTINAL: [ ]   blood in stool  [ ]   hematemesis GENITOURINARY:  [ ]   dysuria  [ ]   hematuria PSYCHIATRIC:  [ ]  history of major depression INTEGUMENTARY:  [ ]  rashes  [ ]  ulcers CONSTITUTIONAL:  [ ]  fever   [ ]  chills  PHYSICAL EXAMINATION:  General: The patient is a well-nourished male, in no acute distress. Vital signs are BP 135/64  Pulse 63  Resp 18  Ht 6' (1.829  m)  Wt 161 lb 3.2 oz (73.12 kg)  BMI 21.86 kg/m2 Pulmonary: There is a good air exchange   Musculoskeletal: There are no major deformities.  There is no significant extremity pain. Neurologic: No focal weakness or paresthesias are detected, Skin:  Some loss a superficial peeling of the skin dorsum of his feet bilaterally no ulcer Psychiatric: The patient has normal affect. Cardiovascular: 2+ femoral pulses and 2+ dorsalis pedis pulses bilaterally   VVS Vascular Lab Studies:  Ordered and Independently Reviewed no evidence of acute or chronic DVT or superficial thrombosis bilateral lower extremities  Impression and Plan:  Discuss the studies with the patient. Do not see any evidence of venous pathology to explain his swelling and erythematous V. Feel that this was around the time of chemotherapy treatment in this may be partially related. I reassured him that there is no need for additional treatment from a vascular standpoint. He will see [place] on an as-needed basis    Joshau Code [facility] Office: [phone]

## 2014-06-12 ENCOUNTER — Other Ambulatory Visit: Payer: Self-pay | Admitting: *Deleted

## 2014-06-12 DIAGNOSIS — C3491 Malignant neoplasm of unspecified part of right bronchus or lung: Secondary | ICD-10-CM

## 2014-06-12 MED ORDER — HYDROCODONE-ACETAMINOPHEN 5-325 MG PO TABS: 1.0000 | ORAL_TABLET | ORAL | Status: DC | PRN

## 2014-06-25 ENCOUNTER — Other Ambulatory Visit (HOSPITAL_BASED_OUTPATIENT_CLINIC_OR_DEPARTMENT_OTHER): Payer: Medicare Other

## 2014-06-25 ENCOUNTER — Encounter: Payer: Self-pay | Admitting: Physician Assistant

## 2014-06-25 ENCOUNTER — Other Ambulatory Visit: Payer: Medicare Other

## 2014-06-25 ENCOUNTER — Telehealth: Payer: Self-pay | Admitting: Internal Medicine

## 2014-06-25 ENCOUNTER — Ambulatory Visit: Payer: Medicare Other | Admitting: Physician Assistant

## 2014-06-25 ENCOUNTER — Ambulatory Visit (HOSPITAL_BASED_OUTPATIENT_CLINIC_OR_DEPARTMENT_OTHER): Payer: Medicare Other | Admitting: Physician Assistant

## 2014-06-25 ENCOUNTER — Ambulatory Visit (HOSPITAL_BASED_OUTPATIENT_CLINIC_OR_DEPARTMENT_OTHER): Payer: Medicare Other

## 2014-06-25 ENCOUNTER — Encounter: Payer: Self-pay | Admitting: Internal Medicine

## 2014-06-25 VITALS — BP 108/60 | HR 68 | Temp 98.5°F | Resp 18 | Ht 72.0 in | Wt 161.4 lb

## 2014-06-25 DIAGNOSIS — M7989 Other specified soft tissue disorders: Secondary | ICD-10-CM

## 2014-06-25 DIAGNOSIS — R5383 Other fatigue: Secondary | ICD-10-CM | POA: Diagnosis not present

## 2014-06-25 DIAGNOSIS — R5381 Other malaise: Secondary | ICD-10-CM

## 2014-06-25 DIAGNOSIS — C7970 Secondary malignant neoplasm of unspecified adrenal gland: Secondary | ICD-10-CM | POA: Diagnosis not present

## 2014-06-25 DIAGNOSIS — C3410 Malignant neoplasm of upper lobe, unspecified bronchus or lung: Secondary | ICD-10-CM

## 2014-06-25 DIAGNOSIS — C3430 Malignant neoplasm of lower lobe, unspecified bronchus or lung: Secondary | ICD-10-CM

## 2014-06-25 DIAGNOSIS — R630 Anorexia: Secondary | ICD-10-CM

## 2014-06-25 DIAGNOSIS — C3490 Malignant neoplasm of unspecified part of unspecified bronchus or lung: Secondary | ICD-10-CM

## 2014-06-25 DIAGNOSIS — Z5111 Encounter for antineoplastic chemotherapy: Secondary | ICD-10-CM | POA: Diagnosis not present

## 2014-06-25 LAB — CBC WITH DIFFERENTIAL/PLATELET
BASO%: 0.3 % (ref 0.0–2.0)
Basophils Absolute: 0 10*3/uL (ref 0.0–0.1)
EOS ABS: 0 10*3/uL (ref 0.0–0.5)
EOS%: 0.6 % (ref 0.0–7.0)
HCT: 32.5 % — ABNORMAL LOW (ref 38.4–49.9)
HGB: 11.2 g/dL — ABNORMAL LOW (ref 13.0–17.1)
LYMPH%: 8.2 % — AB (ref 14.0–49.0)
MCH: 35.3 pg — AB (ref 27.2–33.4)
MCHC: 34.4 g/dL (ref 32.0–36.0)
MCV: 102.7 fL — AB (ref 79.3–98.0)
MONO#: 0.2 10*3/uL (ref 0.1–0.9)
MONO%: 2.8 % (ref 0.0–14.0)
NEUT#: 5.4 10*3/uL (ref 1.5–6.5)
NEUT%: 88.1 % — ABNORMAL HIGH (ref 39.0–75.0)
PLATELETS: 395 10*3/uL (ref 140–400)
RBC: 3.16 10*6/uL — ABNORMAL LOW (ref 4.20–5.82)
RDW: 16.3 % — AB (ref 11.0–14.6)
WBC: 6.2 10*3/uL (ref 4.0–10.3)
lymph#: 0.5 10*3/uL — ABNORMAL LOW (ref 0.9–3.3)

## 2014-06-25 LAB — COMPREHENSIVE METABOLIC PANEL (CC13)
ALBUMIN: 3.4 g/dL — AB (ref 3.5–5.0)
ALK PHOS: 98 U/L (ref 40–150)
ALT: 21 U/L (ref 0–55)
AST: 24 U/L (ref 5–34)
Anion Gap: 11 mEq/L (ref 3–11)
BILIRUBIN TOTAL: 0.56 mg/dL (ref 0.20–1.20)
BUN: 11.5 mg/dL (ref 7.0–26.0)
CO2: 22 mEq/L (ref 22–29)
Calcium: 9.4 mg/dL (ref 8.4–10.4)
Chloride: 95 mEq/L — ABNORMAL LOW (ref 98–109)
Creatinine: 0.8 mg/dL (ref 0.7–1.3)
GLUCOSE: 137 mg/dL (ref 70–140)
POTASSIUM: 4.3 meq/L (ref 3.5–5.1)
Sodium: 128 mEq/L — ABNORMAL LOW (ref 136–145)
Total Protein: 6.1 g/dL — ABNORMAL LOW (ref 6.4–8.3)

## 2014-06-25 MED ORDER — ONDANSETRON 8 MG/NS 50 ML IVPB
INTRAVENOUS | Status: AC
Start: 2014-06-25 — End: 2014-06-25
  Filled 2014-06-25: qty 8

## 2014-06-25 MED ORDER — DEXAMETHASONE SODIUM PHOSPHATE 10 MG/ML IJ SOLN
10.0000 mg | Freq: Once | INTRAMUSCULAR | Status: AC
  Administered 2014-06-25: 10 mg via INTRAVENOUS

## 2014-06-25 MED ORDER — DEXAMETHASONE SODIUM PHOSPHATE 10 MG/ML IJ SOLN
INTRAMUSCULAR | Status: AC
  Filled 2014-06-25: qty 1

## 2014-06-25 MED ORDER — ONDANSETRON 8 MG/50ML IVPB (CHCC)
8.0000 mg | Freq: Once | INTRAVENOUS | Status: AC
  Administered 2014-06-25: 8 mg via INTRAVENOUS

## 2014-06-25 MED ORDER — SODIUM CHLORIDE 0.9 % IV SOLN
500.0000 mg/m2 | Freq: Once | INTRAVENOUS | Status: AC
  Administered 2014-06-25: 975 mg via INTRAVENOUS
  Filled 2014-06-25: qty 39

## 2014-06-25 MED ORDER — SODIUM CHLORIDE 0.9 % IV SOLN
Freq: Once | INTRAVENOUS | Status: AC
  Administered 2014-06-25: 15:00:00 via INTRAVENOUS

## 2014-06-25 NOTE — Telephone Encounter (Signed)
gv adn printed appt sched and avs fo rpt for Aug and Sept....sed added tx.

## 2014-06-25 NOTE — Progress Notes (Addendum)
[facility] Telephone:([phone]   Fax:([phone]) [place], [place] [address]  DIAGNOSIS: Stage IV (T1 A., N0, M1 B.) non-small cell lung cancer, adenocarcinoma with negative EGFR mutation and negative ALK gene translocation diagnosed in October of 2014.   Molecular profile: Negative for RET, ALK, BRAF, MET, EGFR.  Positive for ERBB2 A775T, [id number], KRAS G13E, MAP2K1 D67N (see full report).   PRIOR THERAPY: Systemic chemotherapy with carboplatin for AUC of 5 and Alimta 500 mg/M2 every 3 weeks, status post 6 cycles with stable disease. First dose on 10/23/2013.[name]   CURRENT THERAPY: Maintenance systemic chemotherapy with single agent Alimta 500 mg/M2 every 3 weeks. Status post 4 cycles.  CHEMOTHERAPY INTENT: Palliative  CURRENT # OF CHEMOTHERAPY CYCLES: 5 CURRENT ANTIEMETICS: Zofran, Compazine and dexamethasone  CURRENT SMOKING STATUS: Former smoker  ORAL CHEMOTHERAPY AND CONSENT: None  CURRENT BISPHOSPHONATES USE: None  PAIN MANAGEMENT: 0/10  NARCOTICS INDUCED CONSTIPATION: None  LIVING WILL AND CODE STATUS: ?   INTERVAL HISTORY: Anthony Curry 73 y.o. male returns to the clinic today for followup visit accompanied by his wife. The patient is feeling fine today with no specific complaints except for fatigue and decreased appetite. The swelling of his lower extremities continues to improve.  He denied having any significant chest pain, shortness of breath, cough or hemoptysis. He has no significant weight loss or night sweats. He tolerated the last cycle of her systemic chemotherapy fairly well. He reports that he needs to see his dentist about getting his bridge fixed.  MEDICAL HISTORY: Past Medical History  Diagnosis Date  . Hypertension   . GERD (gastroesophageal reflux disease)   . Alcohol abuse   . 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 Low sodium   . Cancer    small cell/lung    ALLERGIES:  has No Known Allergies.  MEDICATIONS:  Current Outpatient Prescriptions  Medication Sig Dispense Refill  . ALPRAZolam (XANAX) 0.25 MG tablet Take 0.25 mg by mouth 3 (three) times daily as needed for anxiety or sleep.      [name] amLODipine (NORVASC) 10 MG tablet Take 10 mg by mouth daily.       . Ascorbic Acid (VITAMIN C PO) Take 1,000 mg by mouth daily.       [name] atenolol (TENORMIN) 100 MG tablet Take 100 mg by mouth daily.       . B Complex-C (SUPER B COMPLEX PO) Take 1 each by mouth.      . Biotin 7500 MCG TABS Take 1 tablet by mouth.      . calcium carbonate (OS-CAL) 600 MG TABS Take 600 mg by mouth daily with breakfast.      . Cholecalciferol (VITAMIN D-3) 5000 UNITS TABS Take 5,000 Units by mouth daily.       [name] demeclocycline (DECLOMYCIN) 150 MG tablet Take 150 mg by mouth 2 (two) times daily.      [name] dexamethasone (DECADRON) 4 MG tablet 4 milligrams by mouth twice a day the day before, day of and day after the chemotherapy every 3 weeks.  40 tablet  1  . feeding supplement (ENSURE COMPLETE) LIQD Take 237 mLs by mouth daily.  30 Bottle  0  . folic acid (FOLVITE) 1 MG tablet Take 1 tablet (1 mg total) by mouth daily.  30 tablet  6  . furosemide (LASIX) 20 MG tablet Take 1 to 2 tablets by mouth  daily as  Needed for lower extremity swelling  30 tablet  0  . HYDROcodone-acetaminophen (NORCO/VICODIN) 5-325 MG per tablet Take 1-2 tablets by mouth every 4 (four) hours as needed.  30 tablet  0  . Lactobacillus (ACIDOPHILUS PO) Take 1 each by mouth.      [name] MAGNESIUM PO Take 400 mg by mouth daily.       . Multiple Vitamin (MULTIVITAMIN WITH MINERALS) TABS Take 1 tablet by mouth daily.      [name] olmesartan (BENICAR) 40 MG tablet Take 40 mg by mouth daily.       . Omega-3 Fatty Acids (FISH OIL PO) Take 1 tablet by mouth daily.      [name] omeprazole (PRILOSEC) 20 MG capsule Take 20 mg by mouth daily.       [name] OVER THE COUNTER MEDICATION Inject 1 Units into the vein daily.      .  Potassium 99 MG TABS Take 1 tablet by mouth daily.      . prochlorperazine (COMPAZINE) 10 MG tablet Take 1 tablet (10 mg total) by mouth every 6 (six) hours as needed for nausea or vomiting.  60 tablet  0  . tamsulosin (FLOMAX) 0.4 MG CAPS capsule Take 0.4 mg by mouth 2 (two) times daily.      [name] thiamine (VITAMIN B-1) 100 MG tablet Take 1 tablet (100 mg total) by mouth daily.  30 tablet  6  . vitamin E 400 UNIT capsule Take 400 Units by mouth daily.      [name] amoxicillin-clavulanate (AUGMENTIN) 875-125 MG per tablet Take 1 tablet by mouth 2 (two) times daily.  14 tablet  0  . mirtazapine (REMERON) 15 MG tablet Take 1 tablet (15 mg total) by mouth at bedtime.  30 tablet  3   No current facility-administered medications for this visit.    SURGICAL HISTORY:  Past Surgical History  Procedure Laterality Date  . Hemorrhoid surgery    . Ankle arthrodesis  1995    rt fx-hardware in  . Tonsillectomy    . Colonoscopy    . Excision / curettage bone cyst finger  2005    rt thumb  . Total shoulder arthroplasty  02/03/2012    Procedure: TOTAL SHOULDER ARTHROPLASTY;  Surgeon: [name], MD;  Location: [facility];  Service: Orthopedics;  Laterality: Left;    REVIEW OF SYSTEMS:  Constitutional: positive for fatigue and decreased appetite Eyes: negative Ears, nose, mouth, throat, and face: negative Respiratory: negative Cardiovascular: negative Gastrointestinal: negative Genitourinary:negative Integument/breast: negative Hematologic/lymphatic: negative Musculoskeletal:negative Neurological: negative Behavioral/Psych: negative Endocrine: negative Allergic/Immunologic: negative   PHYSICAL EXAMINATION: General appearance: alert, cooperative, fatigued and no distress Head: Normocephalic, without obvious abnormality, atraumatic Neck: no adenopathy, no JVD, supple, symmetrical, trachea midline and thyroid not enlarged, symmetric, no tenderness/mass/nodules Lymph nodes: Cervical,  supraclavicular, and axillary nodes normal. Resp: clear to auscultation bilaterally Back: symmetric, no curvature. ROM normal. No CVA tenderness. Cardio: regular rate and rhythm, S1, S2 normal, no murmur, click, rub or gallop GI: soft, non-tender; bowel sounds normal; no masses,  no organomegaly Extremities: extremities normal, atraumatic, no cyanosis or edema Neurologic: Alert and oriented X 3, normal strength and tone. Normal symmetric reflexes. Normal coordination and gait  ECOG PERFORMANCE STATUS: 1 - Symptomatic but completely ambulatory  Blood pressure 108/60, pulse 68, temperature 98.5 F (36.9 C), temperature source Oral, resp. rate 18, height 6' (1.829 m), weight 161 lb 6.4 oz (73.211 kg), SpO2 99.00%.  LABORATORY DATA: Lab Results  Component  Value Date   WBC 6.2 06/25/2014   HGB 11.2* 06/25/2014   HCT 32.5* 06/25/2014   MCV 102.7* 06/25/2014   PLT 395 06/25/2014      Chemistry      Component Value Date/Time   NA 128* 06/25/2014 1314   NA 122* 04/10/2014 2205   K 4.3 06/25/2014 1314   K 4.7 04/10/2014 2205   CL 85* 04/10/2014 2205   CO2 22 06/25/2014 1314   CO2 24 04/10/2014 2205   BUN 11.5 06/25/2014 1314   BUN 20 04/10/2014 2205   CREATININE 0.8 06/25/2014 1314   CREATININE 1.02 04/10/2014 2205      Component Value Date/Time   CALCIUM 9.4 06/25/2014 1314   CALCIUM 8.9 04/10/2014 2205   ALKPHOS 98 06/25/2014 1314   ALKPHOS 67 04/10/2014 2205   AST 24 06/25/2014 1314   AST 31 04/10/2014 2205   ALT 21 06/25/2014 1314   ALT 16 04/10/2014 2205   BILITOT 0.56 06/25/2014 1314   BILITOT 0.4 04/10/2014 2205       RADIOGRAPHIC STUDIES: Ct Chest W Contrast  06/02/2014   CLINICAL DATA:  Lung cancer, ongoing chemotherapy, hypertension  EXAM: CT CHEST, ABDOMEN, AND PELVIS WITH CONTRAST  TECHNIQUE: Multidetector CT imaging of the chest, abdomen and pelvis was performed following the standard protocol during bolus administration of intravenous contrast. Sagittal and coronal MPR images reconstructed from  axial data set.  CONTRAST:  178m OMNIPAQUE IOHEXOL 300 MG/ML SOLN IV. Dilute oral contrast.  COMPARISON:  02/24/2014  FINDINGS:   CT CHEST FINDINGS  Asymmetric gynecomastia greater on RIGHT, mass not excluded.  Scattered atherosclerotic calcifications aorta and coronary arteries.  Vascular structures grossly patent on nondedicated exam.  Small RIGHT pleural effusion, new.  Fat attenuation nodule inferior to RIGHT coracoid process, 18 x 17 x 19 mm with a higher attenuation rim, question fat attenuation body at the subcoracoid recess versus a lipoma of osseous origin arising from the inferior margin of the coracoid process on coronal images.  Advanced degenerative changes RIGHT glenohumeral joint with prior LEFT shoulder athropalsty.  No thoracic adenopathy.  Stellate nodule LEFT apex 17 x 10 x 19 mm, little changed.  Emphysematous changes with compressive atelectasis RIGHT lower lobe.  Scattered patchy areas of non solid/ground-glass opacity in both lungs similar to previous exam. Additional focal opacity in anterior LEFT upper lobe 12 x 823mimage 27, previously 15 x 8 mm.  Posterior RIGHT upper lobe opacity 18 x 15 mm, previously 17 x 20 mm.  3 mm RIGHT lower lobe nodule image 55 unchanged.  Additional RIGHT upper lobe area of irregular density and air approximately 27 x 14 mm, previously 31 x 15 mm.  Large pleural calcification posterior LEFT lung base image 64 stable.  No new infiltrate, mass, nodule, or pneumothorax.   CT ABDOMEN AND PELVIS FINDINGS  Hypervascular nodule anterior aspect LEFT lobe liver 12 x 9 mm image 58 unchanged.  5 mm hypervascular nodule superior RIGHT lobe liver image 58 unchanged.  Question tiny cyst RIGHT lobe liver adjacent to gallbladder fossa image 73, stable.  Remainder of liver, spleen, pancreas, kidneys, and RIGHT adrenal gland normal.  LEFT adrenal mass 2.5 x 2.4 cm image 71, 2.6 x 2.2 cm by my measurements on previous exam. Dependent gallstones.  Scattered atherosclerotic  calcification.  Descending and sigmoid diverticulosis without diverticulitis.  Stomach and bowel loops otherwise unremarkable.  Normal appendix, ureters and bladder.  No mass, adenopathy, free fluid or free air.  Multilevel degenerative disc and  facet disease changes lumbar spine.    IMPRESSION: Stable hypervascular nodules within liver unchanged since 10/15/2011.  Multiple areas of ground-glass opacity in both lungs, nonspecific, as well as stellate density at the LEFT apex and multiple additional irregular parenchymal abnormalities in both lungs, all stable since previous exam ; recommend continued surveillance.  New small RIGHT pleural effusion and RIGHT basilar atelectasis.  Stable hypervascular nodules within the liver.  Stable LEFT adrenal mass.  Cholelithiasis.  Distal colonic diverticulosis.   Electronically Signed   By: Lavonia Dana M.D.   On: 06/02/2014 16:04   ASSESSMENT AND PLAN: This is a very pleasant 73 years old white male with stage IV non-small cell lung cancer, adenocarcinoma status post induction chemotherapy was carboplatin and Alimta and currently on maintenance treatment with single agent Alimta status post 4 cycles. He tolerated the last cycle of his treatment fairly well with no significant adverse effects. His recent CT scan of the chest, abdomen and pelvis showed no evidence for disease progression. Patient was discussed with and also seen by Dr. [name]. He'll proceed with cycle #5 of his maintenance chemotherapy with simulation Alimta. He'll return in 3 weeks prior to the next scheduled cycle of chemotherapy. He is encouraged to increase his by mouth intake. He is also encouraged to followup with his dentist regarding his needed bridge work. He is also encouraged to slightly increase his salt intake.  He was advised to call immediately if he has any concerning symptoms in the interval. The patient voices understanding of current disease status and treatment options and is in  agreement with the current care plan.  All questions were answered. The patient knows to call the clinic with any problems, questions or concerns. We can certainly see the patient much sooner if necessary.  Carlton Adam, PA-C  ADDENDUM: Hematology/oncology Attending: I had a face to face encounter with the patient. I recommended his care plan. This is a very pleasant 73 years old white male with stage IV non-small cell lung cancer status post induction chemotherapy with carboplatin and Alimta and he is currently on maintenance Alimta status post 4 cycles. He tolerated the last cycle of his treatment fairly well with no significant adverse effects except for mild fatigue. The swelling in his lower extremity has much improved. I recommended for the patient to proceed with cycle #5 today as scheduled. He would come back for followup visit in 3 weeks with the next cycle of his treatment. He was advised to call immediately if he has any concerning symptoms in the interval.  Disclaimer: This note was dictated with voice recognition software. Similar sounding words can inadvertently be transcribed and may not be corrected upon review. Eilleen Kempf., MD 07/01/2014

## 2014-06-27 ENCOUNTER — Telehealth: Payer: Self-pay | Admitting: Medical Oncology

## 2014-06-27 NOTE — Telephone Encounter (Signed)
Has dental appt 8/10 to pull a tooth ( 2 weeks after last treatment 7/22) . Cycle 6 due 8/12. He asked if it was okay to get tooth pulled 2 days before next treatment .   I told him yes

## 2014-06-30 NOTE — Patient Instructions (Signed)
Follow-up in 3 weeks

## 2014-07-07 ENCOUNTER — Telehealth: Payer: Self-pay | Admitting: *Deleted
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
## 2014-07-16 ENCOUNTER — Ambulatory Visit: Payer: Medicare Other

## 2014-07-16 ENCOUNTER — Telehealth: Payer: Self-pay | Admitting: Physician Assistant

## 2014-07-16 ENCOUNTER — Other Ambulatory Visit: Payer: Self-pay | Admitting: Hematology and Oncology

## 2014-07-16 ENCOUNTER — Other Ambulatory Visit: Payer: Medicare Other

## 2014-07-16 ENCOUNTER — Ambulatory Visit (HOSPITAL_BASED_OUTPATIENT_CLINIC_OR_DEPARTMENT_OTHER): Payer: Medicare Other

## 2014-07-16 ENCOUNTER — Encounter: Payer: Self-pay | Admitting: Physician Assistant

## 2014-07-16 ENCOUNTER — Other Ambulatory Visit (HOSPITAL_BASED_OUTPATIENT_CLINIC_OR_DEPARTMENT_OTHER): Payer: Medicare Other

## 2014-07-16 ENCOUNTER — Telehealth: Payer: Self-pay | Admitting: *Deleted

## 2014-07-16 ENCOUNTER — Ambulatory Visit (HOSPITAL_BASED_OUTPATIENT_CLINIC_OR_DEPARTMENT_OTHER): Payer: Medicare Other | Admitting: Physician Assistant

## 2014-07-16 VITALS — BP 102/61 | HR 58 | Temp 97.5°F | Resp 18 | Ht 72.0 in | Wt 157.5 lb

## 2014-07-16 DIAGNOSIS — Z5111 Encounter for antineoplastic chemotherapy: Secondary | ICD-10-CM | POA: Diagnosis not present

## 2014-07-16 DIAGNOSIS — C3490 Malignant neoplasm of unspecified part of unspecified bronchus or lung: Secondary | ICD-10-CM

## 2014-07-16 DIAGNOSIS — C7951 Secondary malignant neoplasm of bone: Secondary | ICD-10-CM | POA: Diagnosis not present

## 2014-07-16 DIAGNOSIS — C3410 Malignant neoplasm of upper lobe, unspecified bronchus or lung: Secondary | ICD-10-CM | POA: Diagnosis not present

## 2014-07-16 DIAGNOSIS — C7970 Secondary malignant neoplasm of unspecified adrenal gland: Secondary | ICD-10-CM | POA: Diagnosis not present

## 2014-07-16 DIAGNOSIS — M949 Disorder of cartilage, unspecified: Secondary | ICD-10-CM | POA: Diagnosis not present

## 2014-07-16 DIAGNOSIS — M899 Disorder of bone, unspecified: Secondary | ICD-10-CM | POA: Diagnosis not present

## 2014-07-16 DIAGNOSIS — C3430 Malignant neoplasm of lower lobe, unspecified bronchus or lung: Secondary | ICD-10-CM

## 2014-07-16 LAB — CBC WITH DIFFERENTIAL/PLATELET
BASO%: 0 % (ref 0.0–2.0)
BASOS ABS: 0 10*3/uL (ref 0.0–0.1)
EOS ABS: 0 10*3/uL (ref 0.0–0.5)
EOS%: 0 % (ref 0.0–7.0)
HCT: 31.6 % — ABNORMAL LOW (ref 38.4–49.9)
HEMOGLOBIN: 11 g/dL — AB (ref 13.0–17.1)
LYMPH%: 13.1 % — ABNORMAL LOW (ref 14.0–49.0)
MCH: 34.6 pg — ABNORMAL HIGH (ref 27.2–33.4)
MCHC: 34.8 g/dL (ref 32.0–36.0)
MCV: 99.4 fL — ABNORMAL HIGH (ref 79.3–98.0)
MONO#: 0 10*3/uL — ABNORMAL LOW (ref 0.1–0.9)
MONO%: 0.8 % (ref 0.0–14.0)
NEUT%: 86.1 % — ABNORMAL HIGH (ref 39.0–75.0)
NEUTROS ABS: 3.1 10*3/uL (ref 1.5–6.5)
Platelets: 427 10*3/uL — ABNORMAL HIGH (ref 140–400)
RBC: 3.18 10*6/uL — ABNORMAL LOW (ref 4.20–5.82)
RDW: 16.5 % — AB (ref 11.0–14.6)
WBC: 3.6 10*3/uL — ABNORMAL LOW (ref 4.0–10.3)
lymph#: 0.5 10*3/uL — ABNORMAL LOW (ref 0.9–3.3)

## 2014-07-16 LAB — COMPREHENSIVE METABOLIC PANEL
ALBUMIN: 3.3 g/dL — AB (ref 3.5–5.2)
ALK PHOS: 104 U/L (ref 39–117)
ALT: 20 U/L (ref 0–53)
AST: 26 U/L (ref 0–37)
BUN: 16 mg/dL (ref 6–23)
CO2: 23 mEq/L (ref 19–32)
Calcium: 9.5 mg/dL (ref 8.4–10.5)
Chloride: 90 mEq/L — ABNORMAL LOW (ref 96–112)
Creatinine, Ser: 0.71 mg/dL (ref 0.50–1.35)
GLUCOSE: 162 mg/dL — AB (ref 70–99)
POTASSIUM: 4.4 meq/L (ref 3.5–5.3)
Sodium: 128 mEq/L — ABNORMAL LOW (ref 135–145)
Total Bilirubin: 0.4 mg/dL (ref 0.2–1.2)
Total Protein: 6.3 g/dL (ref 6.0–8.3)

## 2014-07-16 MED ORDER — ONDANSETRON 8 MG/NS 50 ML IVPB
INTRAVENOUS | Status: AC
  Filled 2014-07-16: qty 8

## 2014-07-16 MED ORDER — CYANOCOBALAMIN 1000 MCG/ML IJ SOLN
1000.0000 ug | Freq: Once | INTRAMUSCULAR | Status: AC
  Administered 2014-07-16: 1000 ug via INTRAMUSCULAR

## 2014-07-16 MED ORDER — DEXAMETHASONE SODIUM PHOSPHATE 10 MG/ML IJ SOLN
10.0000 mg | Freq: Once | INTRAMUSCULAR | Status: AC
  Administered 2014-07-16: 10 mg via INTRAVENOUS

## 2014-07-16 MED ORDER — DEXAMETHASONE SODIUM PHOSPHATE 10 MG/ML IJ SOLN
INTRAMUSCULAR | Status: AC
  Filled 2014-07-16: qty 1

## 2014-07-16 MED ORDER — CYANOCOBALAMIN 1000 MCG/ML IJ SOLN
INTRAMUSCULAR | Status: AC
  Filled 2014-07-16: qty 1

## 2014-07-16 MED ORDER — ONDANSETRON 8 MG/50ML IVPB (CHCC)
8.0000 mg | Freq: Once | INTRAVENOUS | Status: AC
Start: 2014-07-16 — End: 2014-07-16
  Administered 2014-07-16: 8 mg via INTRAVENOUS

## 2014-07-16 MED ORDER — SODIUM CHLORIDE 0.9 % IV SOLN
Freq: Once | INTRAVENOUS | Status: AC
  Administered 2014-07-16: 13:00:00 via INTRAVENOUS

## 2014-07-16 MED ORDER — SODIUM CHLORIDE 0.9 % IV SOLN
500.0000 mg/m2 | Freq: Once | INTRAVENOUS | Status: AC
  Administered 2014-07-16: 975 mg via INTRAVENOUS
  Filled 2014-07-16: qty 39

## 2014-07-16 NOTE — Telephone Encounter (Signed)
Per staff message I have adjusted   9/2 appt

## 2014-07-16 NOTE — Progress Notes (Signed)
[facility] [phone]   Fax:([phone]) [place], [place] [address]  DIAGNOSIS: Stage IV (T1 A., N0, M1 B.) non-small cell lung cancer, adenocarcinoma with negative EGFR mutation and negative ALK gene translocation diagnosed in October of 2014.   Molecular profile: Negative for RET, ALK, BRAF, MET, EGFR.  Positive for ERBB2 A775T, YTK1S010X, KRAS G13E, MAP2K1 D67N (see full report).   PRIOR THERAPY: Systemic chemotherapy with carboplatin for AUC of 5 and Alimta 500 mg/M2 every 3 weeks, status post 6 cycles with stable disease. First dose on 10/23/2013.[name]   CURRENT THERAPY: Maintenance systemic chemotherapy with single agent Alimta 500 mg/M2 every 3 weeks. Status post 5 cycles.   CHEMOTHERAPY INTENT: Palliative  CURRENT # OF CHEMOTHERAPY CYCLES: 6 CURRENT ANTIEMETICS: Zofran, Compazine and dexamethasone  CURRENT SMOKING STATUS: Former smoker  ORAL CHEMOTHERAPY AND CONSENT: None  CURRENT BISPHOSPHONATES USE: None  PAIN MANAGEMENT: 0/10  NARCOTICS INDUCED CONSTIPATION: None  LIVING WILL AND CODE STATUS: ?   INTERVAL HISTORY: Anthony Curry 73 y.o. male returns to the clinic today for followup visit accompanied by his wife. The patient is feeling fine today with no specific complaints except for continued fatigue and decreased appetite. He complains of not sleeping well. He stopped taking the Remeron. He does recall resting better at night when he was taking the Remeron but he felt that his mood had improved and he didn't needed any longer. The swelling of his lower extremities continues to improve.  He denied having any significant chest pain, shortness of breath, cough or hemoptysis. He has no significant weight loss or night sweats. He tolerated the last cycle of her systemic chemotherapy fairly well. He and his wife state that their daughter made by a place in [place] and  would want them to come down and winter in [place]. He is wondering what he would do or could do about his maintenance chemotherapy should this development become a reality.   MEDICAL HISTORY: Past Medical History  Diagnosis Date  . Hypertension   . GERD (gastroesophageal reflux disease)   . Alcohol abuse   . 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 Low sodium   . Cancer     small cell/lung    ALLERGIES:  has No Known Allergies.  MEDICATIONS:  Current Outpatient Prescriptions  Medication Sig Dispense Refill  . ALPRAZolam (XANAX) 0.25 MG tablet Take 0.25 mg by mouth 3 (three) times daily as needed for anxiety or sleep.      [name] amLODipine (NORVASC) 10 MG tablet Take 10 mg by mouth daily.       [name] amoxicillin-clavulanate (AUGMENTIN) 875-125 MG per tablet Take 1 tablet by mouth 2 (two) times daily.  14 tablet  0  . Ascorbic Acid (VITAMIN C PO) Take 1,000 mg by mouth daily.       [name] atenolol (TENORMIN) 100 MG tablet Take 100 mg by mouth daily.       . B Complex-C (SUPER B COMPLEX PO) Take 1 each by mouth.      . Biotin 7500 MCG TABS Take 1 tablet by mouth.      . calcium carbonate (OS-CAL) 600 MG TABS Take 600 mg by mouth daily with breakfast.      . Cholecalciferol (VITAMIN D-3) 5000 UNITS TABS Take 5,000 Units by mouth daily.       [name] demeclocycline (DECLOMYCIN) 150 MG tablet Take 150 mg  by mouth 2 (two) times daily.      [name] dexamethasone (DECADRON) 4 MG tablet 4 milligrams by mouth twice a day the day before, day of and day after the chemotherapy every 3 weeks.  40 tablet  1  . feeding supplement (ENSURE COMPLETE) LIQD Take 237 mLs by mouth daily.  30 Bottle  0  . folic acid (FOLVITE) 1 MG tablet Take 1 tablet (1 mg total) by mouth daily.  30 tablet  6  . furosemide (LASIX) 20 MG tablet Take 1 to 2 tablets by mouth daily as  Needed for lower extremity swelling  30 tablet  0  . Lactobacillus (ACIDOPHILUS PO) Take 1 each by mouth.      [name] MAGNESIUM PO Take 400 mg by mouth daily.       .  mirtazapine (REMERON) 15 MG tablet Take 1 tablet (15 mg total) by mouth at bedtime.  30 tablet  3  . Multiple Vitamin (MULTIVITAMIN WITH MINERALS) TABS Take 1 tablet by mouth daily.      [name] olmesartan (BENICAR) 40 MG tablet Take 40 mg by mouth daily.       . Omega-3 Fatty Acids (FISH OIL PO) Take 1 tablet by mouth daily.      [name] omeprazole (PRILOSEC) 20 MG capsule Take 20 mg by mouth daily.       [name] OVER THE COUNTER MEDICATION Inject 1 Units into the vein daily.      . Potassium 99 MG TABS Take 1 tablet by mouth daily.      . tamsulosin (FLOMAX) 0.4 MG CAPS capsule Take 0.4 mg by mouth 2 (two) times daily.      [name] thiamine (VITAMIN B-1) 100 MG tablet Take 1 tablet (100 mg total) by mouth daily.  30 tablet  6  . vitamin E 400 UNIT capsule Take 400 Units by mouth daily.      [name] HYDROcodone-acetaminophen (NORCO/VICODIN) 5-325 MG per tablet Take 1-2 tablets by mouth every 4 (four) hours as needed.  30 tablet  0  . prochlorperazine (COMPAZINE) 10 MG tablet Take 1 tablet (10 mg total) by mouth every 6 (six) hours as needed for nausea or vomiting.  60 tablet  0   No current facility-administered medications for this visit.    SURGICAL HISTORY:  Past Surgical History  Procedure Laterality Date  . Hemorrhoid surgery    . Ankle arthrodesis  1995    rt fx-hardware in  . Tonsillectomy    . Colonoscopy    . Excision / curettage bone cyst finger  2005    rt thumb  . Total shoulder arthroplasty  02/03/2012    Procedure: TOTAL SHOULDER ARTHROPLASTY;  Surgeon: [name], MD;  Location: [facility];  Service: Orthopedics;  Laterality: Left;    REVIEW OF SYSTEMS:  Constitutional: positive for fatigue and decreased appetite, difficulty sleeping Eyes: negative Ears, nose, mouth, throat, and face: negative Respiratory: negative Cardiovascular: negative Gastrointestinal: negative Genitourinary:negative Integument/breast: negative Hematologic/lymphatic:  negative Musculoskeletal:negative Neurological: negative Behavioral/Psych: negative Endocrine: negative Allergic/Immunologic: negative   PHYSICAL EXAMINATION: General appearance: alert, cooperative, fatigued and no distress Head: Normocephalic, without obvious abnormality, atraumatic Neck: no adenopathy, no JVD, supple, symmetrical, trachea midline and thyroid not enlarged, symmetric, no tenderness/mass/nodules Lymph nodes: Cervical, supraclavicular, and axillary nodes normal. Resp: clear to auscultation bilaterally Back: symmetric, no curvature. ROM normal. No CVA tenderness. Cardio: regular rate and rhythm, S1, S2 normal, no murmur, click, rub or gallop GI: soft, non-tender; bowel sounds normal; no  masses,  no organomegaly Extremities: extremities normal, atraumatic, no cyanosis or edema Neurologic: Alert and oriented X 3, normal strength and tone. Normal symmetric reflexes. Normal coordination and gait  ECOG PERFORMANCE STATUS: 1 - Symptomatic but completely ambulatory  Blood pressure 102/61, pulse 58, temperature 97.5 F (36.4 C), temperature source Oral, resp. rate 18, height 6' (1.829 m), weight 157 lb 8 oz (71.442 kg), SpO2 98.00%.  LABORATORY DATA: Lab Results  Component Value Date   WBC 3.6* 07/16/2014   HGB 11.0* 07/16/2014   HCT 31.6* 07/16/2014   MCV 99.4* 07/16/2014   PLT 427* 07/16/2014      Chemistry      Component Value Date/Time   NA 128* 07/16/2014 1144   NA 128* 06/25/2014 1314   K 4.4 07/16/2014 1144   K 4.3 06/25/2014 1314   CL 90* 07/16/2014 1144   CO2 23 07/16/2014 1144   CO2 22 06/25/2014 1314   BUN 16 07/16/2014 1144   BUN 11.5 06/25/2014 1314   CREATININE 0.71 07/16/2014 1144   CREATININE 0.8 06/25/2014 1314      Component Value Date/Time   CALCIUM 9.5 07/16/2014 1144   CALCIUM 9.4 06/25/2014 1314   ALKPHOS 104 07/16/2014 1144   ALKPHOS 98 06/25/2014 1314   AST 26 07/16/2014 1144   AST 24 06/25/2014 1314   ALT 20 07/16/2014 1144   ALT 21 06/25/2014 1314    BILITOT 0.4 07/16/2014 1144   BILITOT 0.56 06/25/2014 1314       RADIOGRAPHIC STUDIES: Ct Chest W Contrast  06/02/2014   CLINICAL DATA:  Lung cancer, ongoing chemotherapy, hypertension  EXAM: CT CHEST, ABDOMEN, AND PELVIS WITH CONTRAST  TECHNIQUE: Multidetector CT imaging of the chest, abdomen and pelvis was performed following the standard protocol during bolus administration of intravenous contrast. Sagittal and coronal MPR images reconstructed from axial data set.  CONTRAST:  136mL OMNIPAQUE IOHEXOL 300 MG/ML SOLN IV. Dilute oral contrast.  COMPARISON:  02/24/2014  FINDINGS:   CT CHEST FINDINGS  Asymmetric gynecomastia greater on RIGHT, mass not excluded.  Scattered atherosclerotic calcifications aorta and coronary arteries.  Vascular structures grossly patent on nondedicated exam.  Small RIGHT pleural effusion, new.  Fat attenuation nodule inferior to RIGHT coracoid process, 18 x 17 x 19 mm with a higher attenuation rim, question fat attenuation body at the subcoracoid recess versus a lipoma of osseous origin arising from the inferior margin of the coracoid process on coronal images.  Advanced degenerative changes RIGHT glenohumeral joint with prior LEFT shoulder athropalsty.  No thoracic adenopathy.  Stellate nodule LEFT apex 17 x 10 x 19 mm, little changed.  Emphysematous changes with compressive atelectasis RIGHT lower lobe.  Scattered patchy areas of non solid/ground-glass opacity in both lungs similar to previous exam. Additional focal opacity in anterior LEFT upper lobe 12 x 81mm image 27, previously 15 x 8 mm.  Posterior RIGHT upper lobe opacity 18 x 15 mm, previously 17 x 20 mm.  3 mm RIGHT lower lobe nodule image 55 unchanged.  Additional RIGHT upper lobe area of irregular density and air approximately 27 x 14 mm, previously 31 x 15 mm.  Large pleural calcification posterior LEFT lung base image 64 stable.  No new infiltrate, mass, nodule, or pneumothorax.   CT ABDOMEN AND PELVIS FINDINGS   Hypervascular nodule anterior aspect LEFT lobe liver 12 x 9 mm image 58 unchanged.  5 mm hypervascular nodule superior RIGHT lobe liver image 58 unchanged.  Question tiny cyst RIGHT lobe liver adjacent to gallbladder  fossa image 73, stable.  Remainder of liver, spleen, pancreas, kidneys, and RIGHT adrenal gland normal.  LEFT adrenal mass 2.5 x 2.4 cm image 71, 2.6 x 2.2 cm by my measurements on previous exam. Dependent gallstones.  Scattered atherosclerotic calcification.  Descending and sigmoid diverticulosis without diverticulitis.  Stomach and bowel loops otherwise unremarkable.  Normal appendix, ureters and bladder.  No mass, adenopathy, free fluid or free air.  Multilevel degenerative disc and facet disease changes lumbar spine.    IMPRESSION: Stable hypervascular nodules within liver unchanged since 10/15/2011.  Multiple areas of ground-glass opacity in both lungs, nonspecific, as well as stellate density at the LEFT apex and multiple additional irregular parenchymal abnormalities in both lungs, all stable since previous exam ; recommend continued surveillance.  New small RIGHT pleural effusion and RIGHT basilar atelectasis.  Stable hypervascular nodules within the liver.  Stable LEFT adrenal mass.  Cholelithiasis.  Distal colonic diverticulosis.   Electronically Signed   By: Lavonia Dana M.D.   On: 06/02/2014 16:04   ASSESSMENT AND PLAN: This is a very pleasant 73 years old white male with stage IV non-small cell lung cancer, adenocarcinoma status post induction chemotherapy was carboplatin and Alimta and currently on maintenance treatment with single agent Alimta status post 5 cycles. He tolerated the last cycle of his treatment fairly well with no significant adverse effects. His last CT scan of the chest, abdomen and pelvis showed no evidence for disease progression. He'll proceed with cycle #6 of his maintenance chemotherapy with single agent Alimta.  He is encouraged to increase his by mouth intake.  Regarding his difficulty sleeping, vegetation or resume his Remeron at 15 mg by mouth at bedtime. We may consider increasing this to 30 mg at bedtime as needed. He will followup with Dr. [name] in 3 weeks with a restaging CT scan of the chest, abdomen and pelvis with contrast to reevaluate his disease prior to the start of cycle #7. Regarding 22 of his maintenance chemotherapy should you decide to winter in [place], as patient to discuss this with Dr. [name] at his next visit. He was advised to call immediately if he has any concerning symptoms in the interval. The patient voices understanding of current disease status and treatment options and is in agreement with the current care plan.  All questions were answered. The patient knows to call the clinic with any problems, questions or concerns. We can certainly see the patient much sooner if necessary.   Disclaimer: This note was dictated with voice recognition software. Similar sounding words can inadvertently be transcribed and may not be corrected upon review. Carlton Adam, PA-C 07/16/2014

## 2014-07-16 NOTE — Patient Instructions (Signed)
[facility] Discharge Instructions for Patients Receiving Chemotherapy  Today you received the following chemotherapy agents Alimta.  To help prevent nausea and vomiting after your treatment, we encourage you to take your nausea medication as prescribed.   If you develop nausea and vomiting that is not controlled by your nausea medication, call the clinic.   BELOW ARE SYMPTOMS THAT SHOULD BE REPORTED IMMEDIATELY:  *FEVER GREATER THAN 100.5 F  *CHILLS WITH OR WITHOUT FEVER  NAUSEA AND VOMITING THAT IS NOT CONTROLLED WITH YOUR NAUSEA MEDICATION  *UNUSUAL SHORTNESS OF BREATH  *UNUSUAL BRUISING OR BLEEDING  TENDERNESS IN MOUTH AND THROAT WITH OR WITHOUT PRESENCE OF ULCERS  *URINARY PROBLEMS  *BOWEL PROBLEMS  UNUSUAL RASH Items with * indicate a potential emergency and should be followed up as soon as possible.  Feel free to call the clinic you have any questions or concerns. The clinic phone number is [phone].

## 2014-07-16 NOTE — Telephone Encounter (Signed)
Pt confirmed labs/ov/ per 08/12 POF, gave pt AVS....KJ

## 2014-07-17 ENCOUNTER — Other Ambulatory Visit: Payer: Self-pay | Admitting: Internal Medicine

## 2014-07-24 ENCOUNTER — Other Ambulatory Visit: Payer: Self-pay | Admitting: *Deleted

## 2014-07-24 DIAGNOSIS — C3490 Malignant neoplasm of unspecified part of unspecified bronchus or lung: Secondary | ICD-10-CM

## 2014-07-24 DIAGNOSIS — M7989 Other specified soft tissue disorders: Secondary | ICD-10-CM

## 2014-07-24 MED ORDER — MIRTAZAPINE 15 MG PO TABS: 15.0000 mg | ORAL_TABLET | Freq: Every day | ORAL | Status: DC

## 2014-07-24 MED ORDER — FUROSEMIDE 20 MG PO TABS: ORAL_TABLET | ORAL | Status: DC

## 2014-07-24 NOTE — Patient Instructions (Signed)
Follow up in 3 weeks with a restaging CT scan of the chest, abdomen and pelvis to reevaluate your disease

## 2014-07-25 ENCOUNTER — Emergency Department (HOSPITAL_COMMUNITY): Payer: Medicare Other

## 2014-07-25 ENCOUNTER — Other Ambulatory Visit: Payer: Self-pay | Admitting: Physician Assistant

## 2014-07-25 ENCOUNTER — Inpatient Hospital Stay (HOSPITAL_COMMUNITY)
Admission: EM | Admit: 2014-07-25 | Discharge: 2014-07-29 | DRG: 871 | Disposition: A | Discharge status: Home / Self Care | Payer: Medicare Other | Attending: Family Medicine | Admitting: Family Medicine

## 2014-07-25 ENCOUNTER — Telehealth: Payer: Self-pay | Admitting: *Deleted

## 2014-07-25 ENCOUNTER — Encounter (HOSPITAL_COMMUNITY): Payer: Self-pay | Admitting: Emergency Medicine

## 2014-07-25 DIAGNOSIS — I4892 Unspecified atrial flutter: Secondary | ICD-10-CM

## 2014-07-25 DIAGNOSIS — I4891 Unspecified atrial fibrillation: Secondary | ICD-10-CM | POA: Diagnosis present

## 2014-07-25 DIAGNOSIS — Z808 Family history of malignant neoplasm of other organs or systems: Secondary | ICD-10-CM

## 2014-07-25 DIAGNOSIS — E278 Other specified disorders of adrenal gland: Secondary | ICD-10-CM | POA: Diagnosis present

## 2014-07-25 DIAGNOSIS — D638 Anemia in other chronic diseases classified elsewhere: Secondary | ICD-10-CM

## 2014-07-25 DIAGNOSIS — A419 Sepsis, unspecified organism: Secondary | ICD-10-CM | POA: Diagnosis not present

## 2014-07-25 DIAGNOSIS — N400 Enlarged prostate without lower urinary tract symptoms: Secondary | ICD-10-CM | POA: Diagnosis present

## 2014-07-25 DIAGNOSIS — R6520 Severe sepsis without septic shock: Secondary | ICD-10-CM

## 2014-07-25 DIAGNOSIS — Z833 Family history of diabetes mellitus: Secondary | ICD-10-CM

## 2014-07-25 DIAGNOSIS — Z801 Family history of malignant neoplasm of trachea, bronchus and lung: Secondary | ICD-10-CM

## 2014-07-25 DIAGNOSIS — Z8249 Family history of ischemic heart disease and other diseases of the circulatory system: Secondary | ICD-10-CM

## 2014-07-25 DIAGNOSIS — C3490 Malignant neoplasm of unspecified part of unspecified bronchus or lung: Secondary | ICD-10-CM | POA: Diagnosis present

## 2014-07-25 DIAGNOSIS — R918 Other nonspecific abnormal finding of lung field: Secondary | ICD-10-CM | POA: Diagnosis present

## 2014-07-25 DIAGNOSIS — Z79899 Other long term (current) drug therapy: Secondary | ICD-10-CM | POA: Diagnosis not present

## 2014-07-25 DIAGNOSIS — F1021 Alcohol dependence, in remission: Secondary | ICD-10-CM | POA: Diagnosis present

## 2014-07-25 DIAGNOSIS — IMO0002 Reserved for concepts with insufficient information to code with codable children: Secondary | ICD-10-CM | POA: Diagnosis not present

## 2014-07-25 DIAGNOSIS — Z87891 Personal history of nicotine dependence: Secondary | ICD-10-CM

## 2014-07-25 DIAGNOSIS — J9819 Other pulmonary collapse: Secondary | ICD-10-CM | POA: Diagnosis not present

## 2014-07-25 DIAGNOSIS — E871 Hypo-osmolality and hyponatremia: Secondary | ICD-10-CM

## 2014-07-25 DIAGNOSIS — R05 Cough: Secondary | ICD-10-CM | POA: Diagnosis present

## 2014-07-25 DIAGNOSIS — I959 Hypotension, unspecified: Secondary | ICD-10-CM | POA: Diagnosis present

## 2014-07-25 DIAGNOSIS — J189 Pneumonia, unspecified organism: Secondary | ICD-10-CM | POA: Diagnosis not present

## 2014-07-25 DIAGNOSIS — I4819 Other persistent atrial fibrillation: Secondary | ICD-10-CM

## 2014-07-25 DIAGNOSIS — J90 Pleural effusion, not elsewhere classified: Secondary | ICD-10-CM | POA: Diagnosis not present

## 2014-07-25 DIAGNOSIS — K219 Gastro-esophageal reflux disease without esophagitis: Secondary | ICD-10-CM | POA: Diagnosis present

## 2014-07-25 DIAGNOSIS — I10 Essential (primary) hypertension: Secondary | ICD-10-CM | POA: Diagnosis present

## 2014-07-25 DIAGNOSIS — I319 Disease of pericardium, unspecified: Secondary | ICD-10-CM | POA: Diagnosis not present

## 2014-07-25 DIAGNOSIS — R059 Cough, unspecified: Secondary | ICD-10-CM | POA: Diagnosis present

## 2014-07-25 HISTORY — DX: Other persistent atrial fibrillation: I48.19

## 2014-07-25 LAB — COMPREHENSIVE METABOLIC PANEL
ALBUMIN: 3 g/dL — AB (ref 3.5–5.2)
ALT: 20 U/L (ref 0–53)
ANION GAP: 12 (ref 5–15)
AST: 30 U/L (ref 0–37)
Alkaline Phosphatase: 108 U/L (ref 39–117)
BILIRUBIN TOTAL: 0.6 mg/dL (ref 0.3–1.2)
BUN: 10 mg/dL (ref 6–23)
CHLORIDE: 84 meq/L — AB (ref 96–112)
CO2: 25 mEq/L (ref 19–32)
Calcium: 9.1 mg/dL (ref 8.4–10.5)
Creatinine, Ser: 0.7 mg/dL (ref 0.50–1.35)
GFR calc Af Amer: >90 mL/min (ref >90)
GFR calc non Af Amer: >90 mL/min (ref >90)
Glucose, Bld: 109 mg/dL — ABNORMAL HIGH (ref 70–99)
POTASSIUM: 4.2 meq/L (ref 3.7–5.3)
Sodium: 121 mEq/L — CL (ref 137–147)
TOTAL PROTEIN: 6.5 g/dL (ref 6.0–8.3)

## 2014-07-25 LAB — CBC WITH DIFFERENTIAL/PLATELET
BASOS ABS: 0 10*3/uL (ref 0.0–0.1)
Basophils Relative: 0 % (ref 0–1)
EOS ABS: 0.1 10*3/uL (ref 0.0–0.7)
Eosinophils Relative: 3 % (ref 0–5)
HCT: 27.8 % — ABNORMAL LOW (ref 39.0–52.0)
Hemoglobin: 10.1 g/dL — ABNORMAL LOW (ref 13.0–17.0)
LYMPHS PCT: 23 % (ref 12–46)
Lymphs Abs: 1.1 10*3/uL (ref 0.7–4.0)
MCH: 34.5 pg — ABNORMAL HIGH (ref 26.0–34.0)
MCHC: 36.6 g/dL — AB (ref 30.0–36.0)
MCV: 95.2 fL (ref 78.0–100.0)
MONOS PCT: 15 % — AB (ref 3–12)
Monocytes Absolute: 0.7 10*3/uL (ref 0.1–1.0)
NEUTROS ABS: 2.9 10*3/uL (ref 1.7–7.7)
Neutrophils Relative %: 59 % (ref 43–77)
PLATELETS: 209 10*3/uL (ref 150–400)
RBC: 2.92 MIL/uL — ABNORMAL LOW (ref 4.22–5.81)
RDW: 15.3 % (ref 11.5–15.5)
WBC: 4.8 10*3/uL (ref 4.0–10.5)

## 2014-07-25 LAB — I-STAT CG4 LACTIC ACID, ED: LACTIC ACID, VENOUS: 1.76 mmol/L (ref 0.5–2.2)

## 2014-07-25 LAB — I-STAT TROPONIN, ED: Troponin i, poc: 0.01 ng/mL (ref 0.00–0.08)

## 2014-07-25 MED ORDER — SODIUM CHLORIDE 0.9 % IV BOLUS (SEPSIS)
1000.0000 mL | Freq: Once | INTRAVENOUS | Status: AC
  Administered 2014-07-25: 1000 mL via INTRAVENOUS

## 2014-07-25 MED ORDER — ENSURE COMPLETE PO LIQD
237.0000 mL | ORAL | Status: DC
  Administered 2014-07-25 – 2014-07-26 (×2): 237 mL via ORAL

## 2014-07-25 MED ORDER — TAMSULOSIN HCL 0.4 MG PO CAPS
0.4000 mg | ORAL_CAPSULE | Freq: Two times a day (BID) | ORAL | Status: DC
  Administered 2014-07-25 – 2014-07-26 (×2): 0.4 mg via ORAL
  Filled 2014-07-25 (×2): qty 1

## 2014-07-25 MED ORDER — VANCOMYCIN HCL IN DEXTROSE 750-5 MG/150ML-% IV SOLN
750.0000 mg | Freq: Two times a day (BID) | INTRAVENOUS | Status: DC
  Administered 2014-07-25 – 2014-07-28 (×6): 750 mg via INTRAVENOUS
  Filled 2014-07-25 (×7): qty 150

## 2014-07-25 MED ORDER — ALPRAZOLAM 0.25 MG PO TABS
0.2500 mg | ORAL_TABLET | Freq: Three times a day (TID) | ORAL | Status: DC | PRN
  Administered 2014-07-27 – 2014-07-28 (×4): 0.25 mg via ORAL
  Filled 2014-07-25 (×4): qty 1

## 2014-07-25 MED ORDER — IOHEXOL 350 MG/ML SOLN
100.0000 mL | Freq: Once | INTRAVENOUS | Status: AC | PRN
  Administered 2014-07-25: 100 mL via INTRAVENOUS

## 2014-07-25 MED ORDER — DEXTROSE 5 % IV SOLN
1.0000 g | Freq: Three times a day (TID) | INTRAVENOUS | Status: DC
  Administered 2014-07-25 – 2014-07-28 (×8): 1 g via INTRAVENOUS
  Filled 2014-07-25 (×11): qty 1

## 2014-07-25 MED ORDER — ENOXAPARIN SODIUM 40 MG/0.4ML ~~LOC~~ SOLN
40.0000 mg | Freq: Every day | SUBCUTANEOUS | Status: DC
  Administered 2014-07-25 – 2014-07-27 (×3): 40 mg via SUBCUTANEOUS
  Filled 2014-07-25 (×4): qty 0.4

## 2014-07-25 MED ORDER — DEMECLOCYCLINE HCL 150 MG PO TABS
150.0000 mg | ORAL_TABLET | Freq: Two times a day (BID) | ORAL | Status: DC
  Administered 2014-07-25 – 2014-07-29 (×8): 150 mg via ORAL
  Filled 2014-07-25 (×11): qty 1

## 2014-07-25 MED ORDER — VITAMIN B-1 100 MG PO TABS
100.0000 mg | ORAL_TABLET | Freq: Every day | ORAL | Status: DC
  Administered 2014-07-26 – 2014-07-29 (×4): 100 mg via ORAL
  Filled 2014-07-25 (×4): qty 1

## 2014-07-25 MED ORDER — SODIUM CHLORIDE 0.9 % IV SOLN
INTRAVENOUS | Status: AC
  Administered 2014-07-25: 23:00:00 via INTRAVENOUS

## 2014-07-25 MED ORDER — AMOXICILLIN-POT CLAVULANATE 875-125 MG PO TABS
1.0000 | ORAL_TABLET | Freq: Every day | ORAL | Status: DC
  Administered 2014-07-26: 1 via ORAL
  Filled 2014-07-25: qty 1

## 2014-07-25 MED ORDER — IPRATROPIUM-ALBUTEROL 0.5-2.5 (3) MG/3ML IN SOLN
3.0000 mL | Freq: Once | RESPIRATORY_TRACT | Status: AC
  Administered 2014-07-25: 3 mL via RESPIRATORY_TRACT
  Filled 2014-07-25: qty 3

## 2014-07-25 MED ORDER — METOPROLOL TARTRATE 12.5 MG HALF TABLET
12.5000 mg | ORAL_TABLET | Freq: Four times a day (QID) | ORAL | Status: DC
  Filled 2014-07-25: qty 1

## 2014-07-25 MED ORDER — LEVOFLOXACIN IN D5W 750 MG/150ML IV SOLN
750.0000 mg | Freq: Once | INTRAVENOUS | Status: AC
  Administered 2014-07-25: 750 mg via INTRAVENOUS
  Filled 2014-07-25: qty 150

## 2014-07-25 MED ORDER — PANTOPRAZOLE SODIUM 40 MG PO TBEC
40.0000 mg | DELAYED_RELEASE_TABLET | Freq: Every day | ORAL | Status: DC
  Administered 2014-07-26 – 2014-07-29 (×4): 40 mg via ORAL
  Filled 2014-07-25 (×4): qty 1

## 2014-07-25 NOTE — ED Notes (Signed)
Pt reports currently lung cancer, last chemo 8/12, productive cough, and Tuesday TV fell on pt and hit him in the head and left arm. Laceration to left arm.

## 2014-07-25 NOTE — ED Provider Notes (Signed)
CSN: [id number]     Arrival date & time 07/25/14  1706 History   First MD Initiated Contact with Patient 07/25/14 1716     Chief Complaint  Patient presents with  . ca pt, SOB, head injury, arm injury      (Consider location/radiation/quality/duration/timing/severity/associated sxs/prior Treatment) The history is provided by the patient.  Anthony Curry is a 73 y.o. 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 Went to [employer] a week ago. About 5 days ago was trying to look for something and accidentally pulled the TV on to his head. Also had L arm abrasion from it. Denies LOC or persistent headaches. Not on anticoagulation. Since then, he has been having cough with yellow sputum. As per wife, he has some trouble breathing at night as well. Denies fever or chills or chest pain. Denies hx of DVT or PE.    Past Medical History  Diagnosis Date  . Hypertension   . GERD (gastroesophageal reflux disease)   . Alcohol abuse   . 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 Low sodium   . Cancer     small cell/lung   Past Surgical History  Procedure Laterality Date  . Hemorrhoid surgery    . Ankle arthrodesis  1995    rt fx-hardware in  . Tonsillectomy    . Colonoscopy    . Excision / curettage bone cyst finger  2005    rt thumb  . Total shoulder arthroplasty  02/03/2012    Procedure: TOTAL SHOULDER ARTHROPLASTY;  Surgeon: [name], MD;  Location: [facility];  Service: Orthopedics;  Laterality: Left;   Family History  Problem Relation Age of Onset  . Brain cancer Brother   . Lung cancer Mother   . Hypertension Father   . Diabetes Paternal Grandmother    History  Substance Use Topics  . Smoking status: Former Smoker -- 2.00 packs/day for 30 years    Types: Cigarettes    Quit date: 01/27/1984  . Smokeless tobacco: Not on file  . Alcohol Use: No    Review of Systems  Respiratory:  Positive for cough and shortness of breath.   All other systems reviewed and are negative.     Allergies  Review of patient's allergies indicates no known allergies.  Home Medications   Prior to Admission medications   Medication Sig Start Date End Date Taking? Authorizing Provider  ALPRAZolam (XANAX) 0.25 MG tablet Take 0.25 mg by mouth 3 (three) times daily as needed for anxiety or sleep.   Yes Historical Provider, MD  amLODipine (NORVASC) 10 MG tablet Take 10 mg by mouth daily.    Yes Historical Provider, MD  amoxicillin-clavulanate (AUGMENTIN) 875-125 MG per tablet Take 1 tablet by mouth daily.   Yes Historical Provider, MD  Ascorbic Acid (VITAMIN C PO) Take 1,000 mg by mouth daily.    Yes Historical Provider, MD  atenolol (TENORMIN) 100 MG tablet Take 100 mg by mouth daily.    Yes Historical Provider, MD  B Complex-C (SUPER B COMPLEX PO) Take 1 each by mouth.   Yes Historical Provider, MD  Biotin 7500 MCG TABS Take 1 tablet by mouth.   Yes Historical Provider, MD  calcium carbonate (OS-CAL) 600 MG TABS Take 600 mg by mouth daily with breakfast.   Yes Historical Provider, MD  Cholecalciferol (VITAMIN D-3) 5000 UNITS TABS Take 5,000 Units by mouth daily.  Yes Historical Provider, MD  demeclocycline (DECLOMYCIN) 150 MG tablet Take 150 mg by mouth 2 (two) times daily.   Yes Historical Provider, MD  dexamethasone (DECADRON) 4 MG tablet TAKE 1 TABLET BY MOUTH TWICE A DAY THE DAY BEFORE, DAY OF AND DAY AFTER CHEMOTHERAPY EVERY 3 WEEKS 07/17/14  Yes [name], MD  dexamethasone (DECADRON) 4 MG tablet Take 4 mg by mouth See admin instructions. Take one tablet by mouth twice daily the day before, the day of, and the day after chemotherapy every three weeks.   Yes Historical Provider, MD  feeding supplement (ENSURE COMPLETE) LIQD Take 237 mLs by mouth daily. 07/08/13  Yes Monika Salk, MD  folic acid (FOLVITE) 1 MG tablet Take 1 tablet (1 mg total) by mouth daily. 09/11/13  Yes Erick Colace, NP  furosemide (LASIX) 20 MG tablet Take 1 tablet  by mouth daily as  Needed for lower extremity swelling. 07/24/14  Yes Carlton Adam, PA-C  HYDROcodone-acetaminophen (NORCO/VICODIN) 5-325 MG per tablet Take 1-2 tablets by mouth every 4 (four) hours as needed. 06/12/14  Yes [name], MD  Lactobacillus (ACIDOPHILUS PO) Take 1 each by mouth.   Yes Historical Provider, MD  MAGNESIUM PO Take 400 mg by mouth daily.    Yes Historical Provider, MD  Multiple Vitamin (MULTIVITAMIN WITH MINERALS) TABS Take 1 tablet by mouth daily.   Yes Historical Provider, MD  olmesartan (BENICAR) 40 MG tablet Take 40 mg by mouth daily.    Yes Historical Provider, MD  Omega-3 Fatty Acids (FISH OIL PO) Take 1 tablet by mouth daily.   Yes Historical Provider, MD  omeprazole (PRILOSEC) 20 MG capsule Take 20 mg by mouth daily.    Yes Historical Provider, MD  OVER THE COUNTER MEDICATION Inject 1 Units into the vein daily.   Yes Historical Provider, MD  Potassium 99 MG TABS Take 1 tablet by mouth daily.   Yes Historical Provider, MD  prochlorperazine (COMPAZINE) 10 MG tablet Take 1 tablet (10 mg total) by mouth every 6 (six) hours as needed for nausea or vomiting. 03/21/14  Yes Carlton Adam, PA-C  tamsulosin (FLOMAX) 0.4 MG CAPS capsule Take 0.4 mg by mouth 2 (two) times daily.   Yes Historical Provider, MD  thiamine (VITAMIN B-1) 100 MG tablet Take 1 tablet (100 mg total) by mouth daily. 09/11/13  Yes Erick Colace, NP  vitamin E 400 UNIT capsule Take 400 Units by mouth daily.   Yes Historical Provider, MD   BP 88/57  Pulse 40  Temp(Src) 97.5 F (36.4 C) (Oral)  Resp 23  SpO2 78% Physical Exam  Vitals reviewed. Constitutional: He is oriented to person, place, and time.  Chronically ill   HENT:  Head: Normocephalic.  Mouth/Throat: Oropharynx is clear and moist.  Small abrasion on forehead, no obvious scalp hematoma   Eyes: Conjunctivae and EOM are normal. Pupils are equal, round, and reactive to  light.  Neck: Normal range of motion. Neck supple.  Cardiovascular: Regular rhythm and normal heart sounds.   Slightly tachy   Pulmonary/Chest:  Diffuse wheezing, no obvious crackles. Not using accessory muscles   Abdominal: Soft. Bowel sounds are normal. He exhibits no distension. There is no tenderness. There is no rebound and no guarding.  Musculoskeletal: Normal range of motion. He exhibits no edema and no tenderness.  Neurological: He is alert and oriented to person, place, and time. No cranial nerve deficit. Coordination normal.  Nl strength and sensation throughout.   Skin:  Skin is dry.  Abrasion L forearm, no active bleeding. No bony tenderness   Psychiatric: He has a normal mood and affect. His behavior is normal. Judgment and thought content normal.    ED Course  Procedures (including critical care time)  CRITICAL CARE Performed by: Darl Householder, Joyanna Kleman   Total critical care time: 30 min   Critical care time was exclusive of separately billable procedures and treating other patients.  Critical care was necessary to treat or prevent imminent or life-threatening deterioration.  Critical care was time spent personally by me on the following activities: development of treatment plan with patient and/or surrogate as well as nursing, discussions with consultants, evaluation of patient's response to treatment, examination of patient, obtaining history from patient or surrogate, ordering and performing treatments and interventions, ordering and review of laboratory studies, ordering and review of radiographic studies, pulse oximetry and re-evaluation of patient's condition.   Labs Review Labs Reviewed  CBC WITH DIFFERENTIAL - Abnormal; Notable for the following:    RBC 2.92 (*)    Hemoglobin 10.1 (*)    HCT 27.8 (*)    MCH 34.5 (*)    MCHC 36.6 (*)    Monocytes Relative 15 (*)    All other components within normal limits  COMPREHENSIVE METABOLIC PANEL - Abnormal; Notable for the  following:    Sodium 121 (*)    Chloride 84 (*)    Glucose, Bld 109 (*)    Albumin 3.0 (*)    All other components within normal limits  CULTURE, BLOOD (ROUTINE X 2)  CULTURE, BLOOD (ROUTINE X 2)  I-STAT TROPOININ, ED  I-STAT CG4 LACTIC ACID, ED    Imaging Review Dg Chest 2 View  07/25/2014   CLINICAL DATA:  Productive cough, lung cancer  EXAM: CHEST  2 VIEW  COMPARISON:  06/02/2014  FINDINGS: Significant degenerative changes right shoulder again noted. Multiple old right rib fractures. No pulmonary edema. There is small right pleural effusion with right base posterior atelectasis or infiltrate. No pulmonary edema. A small nodule in left apex is stable from prior exam.  IMPRESSION: Small right pleural effusion with right base posterior atelectasis or infiltrate. No pulmonary edema. Multiple old right rib fractures. Degenerative changes right shoulder.   Electronically Signed   By: Lahoma Crocker M.D.   On: 07/25/2014 18:13   Ct Angio Chest Pe W/cm &/or Wo Cm  07/25/2014   CLINICAL DATA:  73 year old male with increasing shortness of breath. Initial encounter. Lung cancer.  EXAM: CT ANGIOGRAPHY CHEST WITH CONTRAST  TECHNIQUE: Multidetector CT imaging of the chest was performed using the standard protocol during bolus administration of intravenous contrast. Multiplanar CT image reconstructions and MIPs were obtained to evaluate the vascular anatomy.  CONTRAST:  1107mL OMNIPAQUE IOHEXOL 350 MG/ML SOLN  COMPARISON:  Chest CT 06/02/2014 and earlier.  FINDINGS: Good contrast bolus timing in the pulmonary arterial tree. Respiratory motion artifact at the lung bases. No focal filling defect identified in the pulmonary arterial tree to suggest the presence of acute pulmonary embolism.Intermittent retained secretions mostly along the anterior trachea (series 11, image 28). Increased layering right pleural effusion, now moderate. Trace layering left pleural effusion is new.  Spiculated left apical lung nodule is  mildly increased measuring 22 x 12 mm on axial images (20 x 9 mm previously). Apical paraseptal emphysema. Increased pulmonary ground-glass opacity, mostly peri-bronchovascular in nature, with continued more focal areas of irregular opacity as documented in June. Increased lower lobe atelectasis. Bilateral peribronchial thickening. No consolidation.  Evidence of a small pericardial effusion along the posterior heart border, probably inconsequential but appears increased. Calcified atherosclerosis of the aorta. Patent visualized major aortic branches. Stable thoracic inlet. No mediastinal or hilar lymphadenopathy.  Stable visualized liver parenchyma. Cholelithiasis. Stable visualized spleen and pancreas. Stable right adrenal gland and visualized kidneys. Left adrenal mass is stable measuring 24 mm diameter. Grossly negative visible bowel in the upper abdomen.  Advanced degenerative changes in the spine. Multiple bilateral chronic rib fractures, some ununited. Advanced probable degenerative osseous changes at the right glenohumeral joint. Sequelae of left glenohumeral arthroplasty. No acute or suspicious osseous lesion identified.  Review of the MIP images confirms the above findings.  IMPRESSION: 1.  No evidence of acute pulmonary embolus. 2. Increased layering pleural effusions, moderate on the right and small on the left. 3. Increased patchy bilateral pulmonary ground-glass opacity suspicious for acute infectious exacerbation superimposed on chronic lung disease. 4. Stable to mildly increased spiculated left apical lung nodule. Stable left adrenal metastasis.   Electronically Signed   By: Lars Pinks M.D.   On: 07/25/2014 19:16     EKG Interpretation   Date/Time:  Friday July 25 2014 17:24:56 EDT Ventricular Rate:  84 PR Interval:  185 QRS Duration: 76 QT Interval:  351 QTC Calculation: 415 R Axis:   87 Text Interpretation:  Unknown rhythm, irregular rate Borderline right axis  deviation Low voltage,  precordial leads Borderline T abnormalities,  anterior leads Baseline wander in lead(s) V6 No significant change since  last tracing Confirmed by Deshane Cotroneo  MD, Dudley Cooley ([id number]) on 07/25/2014 5:32:48 PM      MDM   Final diagnoses:  None   Anthony Curry is a 73 y.o. male here with cough, wheezing. I think likely bronchitis vs pneumonia. He is at risk for PE and had recent travel but presentation seem more consistent with bronchitis. Will check labs and CXR. Will give nebs and reassess. Had head injury several days but has no headache and is not on blood thinners so will hold off on CT right now.    6PM Patient became hypotensive. Lactate and WBC nl. I ordered blood cultures and abx. CXR showed possible pneumonia. Will also get CT to r/o PE given hypotension.   7:41 PM CT showed no PE, but there is pneumonia. Given IV abx. Still hypotensive. Consider severe sepsis. Will admit to step down. Also hyponatremic, given IVF,.    Wandra Arthurs, MD 07/25/14 [phone]

## 2014-07-25 NOTE — Progress Notes (Signed)
ANTIBIOTIC CONSULT NOTE - INITIAL  Pharmacy Consult for vancomycin, Pharmacy is consulted for antibiotic monitoring  Indication: pneumonia  No Known Allergies  Patient Measurements: Height: 6' (182.9 cm) Weight: 152 lb 8.9 oz (69.2 kg) IBW/kg (Calculated) : 77.6 Adjusted Body Weight:   Vital Signs: Temp: 98.1 F (36.7 C) (08/21 2242) Temp src: Oral (08/21 2242) BP: 112/72 mmHg (08/21 2120) Pulse Rate: 131 (08/21 2136) Intake/Output from previous day:   Intake/Output from this shift:    Labs:  Recent Labs  07/25/14 1732  WBC 4.8  HGB 10.1*  PLT 209  CREATININE 0.70   Estimated Creatinine Clearance: 79.3 ml/min (by C-G formula based on Cr of 0.7). No results found for this basename: VANCOTROUGH, VANCOPEAK, VANCORANDOM, GENTTROUGH, GENTPEAK, GENTRANDOM, TOBRATROUGH, TOBRAPEAK, TOBRARND, AMIKACINPEAK, AMIKACINTROU, AMIKACIN,  in the last 72 hours   Microbiology: No results found for this or any previous visit (from the past 720 hour(s)).  Medical History: Past Medical History  Diagnosis Date  . Hypertension   . GERD (gastroesophageal reflux disease)   . Alcohol abuse   . 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 Low sodium   . Cancer     small cell/lung    Medications:  Anti-infectives   Start     Dose/Rate Route Frequency Ordered Stop   07/26/14 1000  amoxicillin-clavulanate (AUGMENTIN) 875-125 MG per tablet 1 tablet     1 tablet Oral Daily 07/25/14 2226     07/25/14 2300  vancomycin (VANCOCIN) IVPB 750 mg/150 ml premix     750 mg 150 mL/hr over 60 Minutes Intravenous 2 times daily 07/25/14 2247     07/25/14 2230  demeclocycline (DECLOMYCIN) tablet 150 mg     150 mg Oral 2 times daily 07/25/14 2226     07/25/14 2230  ceFEPIme (MAXIPIME) 1 g in dextrose 5 % 50 mL IVPB     1 g 100 mL/hr over 30 Minutes Intravenous 3 times per day 07/25/14 2226 08/02/14 2159   07/25/14 1830  levofloxacin (LEVAQUIN) IVPB 750 mg     750 mg 100 mL/hr over 90 Minutes  Intravenous  Once 07/25/14 1818 07/25/14 2018     Assessment: Patient with HCAP.  First dose of antibiotics already given.  Goal of Therapy:  Vancomycin trough level 15-20 mcg/ml Cefepime dosed based on patient weight and renal function   Plan:  Measure antibiotic drug levels at steady state Follow up culture results Vancomycin 750mg  iv q12hr Continue cefepime 1gm iv q8hr  Tyler Deis, Shea Stakes Crowford 07/25/2014,10:47 PM

## 2014-07-25 NOTE — Telephone Encounter (Signed)
PT. HAS COUGHED UP SOME YELLOW SPUTUM AT TIMES. HE HAS "CRACKLES IN HIS CHEST". PT. IS FATIGUED AND SHORT OF BREATH WHEN RESTING. PT. HAS NOT TAKEN HIS TEMPERATURE. NO PAIN AT PRESENT. HOWEVER ON Tuesday A TV FELL ON PT. HEAD AND CUT HIS LEFT ARM. PT.DOES HAVE A BRUISE ON HIS FOREHEAD. VERBAL ORDER AND READ BACK TO [facility]- PT. NEEDS TO GO TO THE EMERGENCY ROOM FOR EVALUATION. NOTIFIED PT.'S WIFE. SHE VOICES UNDERSTANDING. CALLED [facility] RO[facility] TRIAGE NURSE CONCERNING PT.

## 2014-07-25 NOTE — ED Notes (Signed)
hospitalist at bedside

## 2014-07-25 NOTE — ED Notes (Signed)
Pt to xray

## 2014-07-25 NOTE — ED Notes (Signed)
Patient transported to CT 

## 2014-07-25 NOTE — H&P (Signed)
PCP: [name], MD  Oncology: [name]  Chief Complaint:  cough  HPI: Anthony Curry is a 73 y.o. 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   Presented with  Patient is being actively treated for non-small cell lung cancer last chemo was 8/12. He went to vocation to [facility] but have not felt well while he was there. For the past 3 days he has been coughing productive of yellow phlegm.  Denies any fever but have been not feeling well. Decreased PO intake. Patient deneis dyspnea but wife thinks he is breathing faster. Lately he noticed his blood pressure have been low down to 100's.  In ER he was given 2L IVF and CT angio of the chest was done showing evidence of pleural effusion and likely PNA. patietn was started on levaquin.  Hospitalist was called for admission for HCAP  Review of Systems:    Pertinent positives include:  Fatigue , dizziness, productive cough,   Constitutional:  No weight loss, night sweats, Fevers, chills, , weight loss  HEENT:  No headaches, Difficulty swallowing,Tooth/dental problems,Sore throat,  No sneezing, itching, ear ache, nasal congestion, post nasal drip,  Cardio-vascular:  No chest pain, Orthopnea, PND, anasarca,  palpitations.no Bilateral lower extremity swelling  GI:  No heartburn, indigestion, abdominal pain, nausea, vomiting, diarrhea, change in bowel habits, loss of appetite, melena, blood in stool, hematemesis Resp:  no shortness of breath at rest. No dyspnea on exertion, No excess mucus, noNo non-productive cough, No coughing up of blood.No change in color of mucus.No wheezing. Skin:  no rash or lesions. No jaundice GU:  no dysuria, change in color of urine, no urgency or frequency. No straining to urinate.  No flank pain.  Musculoskeletal:  No joint pain or no joint swelling. No decreased  range of motion. No back pain.  Psych:  No change in mood or affect. No depression or anxiety. No memory loss.  Neuro: no localizing neurological complaints, no tingling, no weakness, no double vision, no gait abnormality, no slurred speech, no confusion  Otherwise ROS are negative except for above, 10 systems were reviewed  Past Medical History: Past Medical History  Diagnosis Date  . Hypertension   . GERD (gastroesophageal reflux disease)   . Alcohol abuse   . 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 Low sodium   . Cancer     small cell/lung   Past Surgical History  Procedure Laterality Date  . Hemorrhoid surgery    . Ankle arthrodesis  1995    rt fx-hardware in  . Tonsillectomy    . Colonoscopy    . Excision / curettage bone cyst finger  2005    rt thumb  . Total shoulder arthroplasty  02/03/2012    Procedure: TOTAL SHOULDER ARTHROPLASTY;  Surgeon: [name], MD;  Location: [place];  Service: Orthopedics;  Laterality: Left;     Medications: Prior to Admission medications   Medication Sig Start Date End Date Taking? Authorizing Provider  ALPRAZolam (XANAX) 0.25 MG tablet Take 0.25 mg by mouth 3 (three) times daily as needed for anxiety or sleep.   Yes Historical Provider, MD  amLODipine (NORVASC) 10 MG tablet Take 10 mg by mouth daily.    Yes Historical Provider, MD  amoxicillin-clavulanate (AUGMENTIN) 875-125 MG per tablet Take 1 tablet by mouth daily.   Yes Historical Provider, MD  Ascorbic Acid (VITAMIN  C PO) Take 1,000 mg by mouth daily.    Yes Historical Provider, MD  atenolol (TENORMIN) 100 MG tablet Take 100 mg by mouth daily.    Yes Historical Provider, MD  B Complex-C (SUPER B COMPLEX PO) Take 1 each by mouth.   Yes Historical Provider, MD  Biotin 7500 MCG TABS Take 1 tablet by mouth.   Yes Historical Provider, MD  calcium carbonate (OS-CAL) 600 MG TABS Take 600 mg by mouth daily with breakfast.   Yes Historical Provider, MD    Cholecalciferol (VITAMIN D-3) 5000 UNITS TABS Take 5,000 Units by mouth daily.    Yes Historical Provider, MD  demeclocycline (DECLOMYCIN) 150 MG tablet Take 150 mg by mouth 2 (two) times daily.   Yes Historical Provider, MD  dexamethasone (DECADRON) 4 MG tablet TAKE 1 TABLET BY MOUTH TWICE A DAY THE DAY BEFORE, DAY OF AND DAY AFTER CHEMOTHERAPY EVERY 3 WEEKS 07/17/14  Yes [name], MD  dexamethasone (DECADRON) 4 MG tablet Take 4 mg by mouth See admin instructions. Take one tablet by mouth twice daily the day before, the day of, and the day after chemotherapy every three weeks.   Yes Historical Provider, MD  feeding supplement (ENSURE COMPLETE) LIQD Take 237 mLs by mouth daily. 07/08/13  Yes Monika Salk, MD  folic acid (FOLVITE) 1 MG tablet Take 1 tablet (1 mg total) by mouth daily. 09/11/13  Yes Erick Colace, NP  furosemide (LASIX) 20 MG tablet Take 1 tablet  by mouth daily as  Needed for lower extremity swelling. 07/24/14  Yes Carlton Adam, PA-C  HYDROcodone-acetaminophen (NORCO/VICODIN) 5-325 MG per tablet Take 1-2 tablets by mouth every 4 (four) hours as needed. 06/12/14  Yes [name], MD  Lactobacillus (ACIDOPHILUS PO) Take 1 each by mouth.   Yes Historical Provider, MD  MAGNESIUM PO Take 400 mg by mouth daily.    Yes Historical Provider, MD  Multiple Vitamin (MULTIVITAMIN WITH MINERALS) TABS Take 1 tablet by mouth daily.   Yes Historical Provider, MD  olmesartan (BENICAR) 40 MG tablet Take 40 mg by mouth daily.    Yes Historical Provider, MD  Omega-3 Fatty Acids (FISH OIL PO) Take 1 tablet by mouth daily.   Yes Historical Provider, MD  omeprazole (PRILOSEC) 20 MG capsule Take 20 mg by mouth daily.    Yes Historical Provider, MD  OVER THE COUNTER MEDICATION Inject 1 Units into the vein daily.   Yes Historical Provider, MD  Potassium 99 MG TABS Take 1 tablet by mouth daily.   Yes Historical Provider, MD  prochlorperazine (COMPAZINE) 10 MG tablet Take 1 tablet (10 mg total) by  mouth every 6 (six) hours as needed for nausea or vomiting. 03/21/14  Yes Carlton Adam, PA-C  tamsulosin (FLOMAX) 0.4 MG CAPS capsule Take 0.4 mg by mouth 2 (two) times daily.   Yes Historical Provider, MD  thiamine (VITAMIN B-1) 100 MG tablet Take 1 tablet (100 mg total) by mouth daily. 09/11/13  Yes Erick Colace, NP  vitamin E 400 UNIT capsule Take 400 Units by mouth daily.   Yes Historical Provider, MD    Allergies:  No Known Allergies  Social History:  Ambulatory  Independently except for past 3 days Lives at home  With family     reports that he quit smoking about 30 years ago. His smoking use included Cigarettes. He has a 60 pack-year smoking history. He does not have any smokeless tobacco history on file. He reports that he  does not drink alcohol or use illicit drugs.    Family History: family history includes Brain cancer in his brother; Diabetes in his paternal grandmother; Hypertension in his father; Lung cancer in his mother.    Physical Exam: Patient Vitals for the past 24 hrs:  BP Temp Temp src Pulse Resp SpO2  07/25/14 1940 102/62 mmHg - - - 11 -  07/25/14 1921 88/57 mmHg - - 40 23 78 %  07/25/14 1840 100/55 mmHg - - 108 24 97 %  07/25/14 1820 95/61 mmHg - - 75 20 100 %  07/25/14 1810 - - - 70 21 100 %  07/25/14 1800 98/54 mmHg - - - 20 -  07/25/14 1758 - - - - - 97 %  07/25/14 1756 - - - 60 14 94 %  07/25/14 1755 94/64 mmHg - - 30 - 98 %  07/25/14 1730 - - - 60 25 100 %  07/25/14 1725 98/59 mmHg - - 87 19 -  07/25/14 1720 112/63 mmHg 97.5 F (36.4 C) Oral 77 16 96 %    1. General:  in No Acute distress 2. Psychological: Alert and  Oriented 3. Head/ENT:    Dry Mucous Membranes                          Head Non traumatic, neck supple                          Normal  Dentition 4. SKIN:  decreased Skin turgor,  Skin clean Dry and intact no rash 5. Heart: Regular rate and rhythm no Murmur, Rub or gallop 6. Lungs:   no wheezes some  crackles  Diminished at  the bases 7. Abdomen: Soft, non-tender, Non distended 8. Lower extremities: no clubbing, cyanosis, or edema 9. Neurologically Grossly intact, moving all 4 extremities equally 10. MSK: Normal range of motion  body mass index is unknown because there is no weight on file.   Labs on Admission:   Recent Labs  07/25/14 1732  NA 121*  K 4.2  CL 84*  CO2 25  GLUCOSE 109*  BUN 10  CREATININE 0.70  CALCIUM 9.1    Recent Labs  07/25/14 1732  AST 30  ALT 20  ALKPHOS 108  BILITOT 0.6  PROT 6.5  ALBUMIN 3.0*   No results found for this basename: LIPASE, AMYLASE,  in the last 72 hours  Recent Labs  07/25/14 1732  WBC 4.8  NEUTROABS 2.9  HGB 10.1*  HCT 27.8*  MCV 95.2  PLT 209   No results found for this basename: CKTOTAL, CKMB, CKMBINDEX, TROPONINI,  in the last 72 hours No results found for this basename: TSH, T4TOTAL, FREET3, T3FREE, THYROIDAB,  in the last 72 hours No results found for this basename: VITAMINB12, FOLATE, FERRITIN, TIBC, IRON, RETICCTPCT,  in the last 72 hours No results found for this basename: HGBA1C    The CrCl is unknown because both a height and weight (above a minimum accepted value) are required for this calculation. ABG    Component Value Date/Time   HCO3 25.5* 05/29/2007 1403   TCO2 27 05/29/2007 1403     No results found for this basename: DDIMER     Other results:  I have pearsonaly reviewed this: ECG REPORT  Rate: 86  Rhythm: A.flutter with 3:1 bock ST&T Change: no ischemia   BNP (last 3 results) No results found for this  basename: PROBNP,  in the last 8760 hours  There were no vitals filed for this visit.   Cultures:    Component Value Date/Time   SDES URINE, CLEAN CATCH 04/10/2014 2253   SPECREQUEST NONE 04/10/2014 2253   CULT  Value: INSIGNIFICANT GROWTH Performed at [facility] 04/10/2014 2253   REPTSTATUS 04/12/2014 FINAL 04/10/2014 2253   Radiological Exams on Admission: Dg Chest 2 View  07/25/2014   CLINICAL  DATA:  Productive cough, lung cancer  EXAM: CHEST  2 VIEW  COMPARISON:  06/02/2014  FINDINGS: Significant degenerative changes right shoulder again noted. Multiple old right rib fractures. No pulmonary edema. There is small right pleural effusion with right base posterior atelectasis or infiltrate. No pulmonary edema. A small nodule in left apex is stable from prior exam.  IMPRESSION: Small right pleural effusion with right base posterior atelectasis or infiltrate. No pulmonary edema. Multiple old right rib fractures. Degenerative changes right shoulder.   Electronically Signed   By: Lahoma Crocker M.D.   On: 07/25/2014 18:13   Ct Angio Chest Pe W/cm &/or Wo Cm  07/25/2014   CLINICAL DATA:  73 year old male with increasing shortness of breath. Initial encounter. Lung cancer.  EXAM: CT ANGIOGRAPHY CHEST WITH CONTRAST  TECHNIQUE: Multidetector CT imaging of the chest was performed using the standard protocol during bolus administration of intravenous contrast. Multiplanar CT image reconstructions and MIPs were obtained to evaluate the vascular anatomy.  CONTRAST:  123mL OMNIPAQUE IOHEXOL 350 MG/ML SOLN  COMPARISON:  Chest CT 06/02/2014 and earlier.  FINDINGS: Good contrast bolus timing in the pulmonary arterial tree. Respiratory motion artifact at the lung bases. No focal filling defect identified in the pulmonary arterial tree to suggest the presence of acute pulmonary embolism.Intermittent retained secretions mostly along the anterior trachea (series 11, image 28). Increased layering right pleural effusion, now moderate. Trace layering left pleural effusion is new.  Spiculated left apical lung nodule is mildly increased measuring 22 x 12 mm on axial images (20 x 9 mm previously). Apical paraseptal emphysema. Increased pulmonary ground-glass opacity, mostly peri-bronchovascular in nature, with continued more focal areas of irregular opacity as documented in June. Increased lower lobe atelectasis. Bilateral  peribronchial thickening. No consolidation.  Evidence of a small pericardial effusion along the posterior heart border, probably inconsequential but appears increased. Calcified atherosclerosis of the aorta. Patent visualized major aortic branches. Stable thoracic inlet. No mediastinal or hilar lymphadenopathy.  Stable visualized liver parenchyma. Cholelithiasis. Stable visualized spleen and pancreas. Stable right adrenal gland and visualized kidneys. Left adrenal mass is stable measuring 24 mm diameter. Grossly negative visible bowel in the upper abdomen.  Advanced degenerative changes in the spine. Multiple bilateral chronic rib fractures, some ununited. Advanced probable degenerative osseous changes at the right glenohumeral joint. Sequelae of left glenohumeral arthroplasty. No acute or suspicious osseous lesion identified.  Review of the MIP images confirms the above findings.  IMPRESSION: 1.  No evidence of acute pulmonary embolus. 2. Increased layering pleural effusions, moderate on the right and small on the left. 3. Increased patchy bilateral pulmonary ground-glass opacity suspicious for acute infectious exacerbation superimposed on chronic lung disease. 4. Stable to mildly increased spiculated left apical lung nodule. Stable left adrenal metastasis.   Electronically Signed   By: Lars Pinks M.D.   On: 07/25/2014 19:16    Chart has been reviewed  Assessment/Plan  73 year old gentleman history of non-small cell lung cancer here with pneumonia complicated by new a.  Flatter and dehydration.   Present on Admission:  .  HCAP (healthcare-associated pneumonia) - will cover broad-spectrum antibiotics including cefepime vancomycin. Await results of sputum and blood cultures.  . Lung cancer - in the morning we'll need to let Dr. Earlie Server know that patient is here  . Hypotension - patient states that he have had low blood pressures for quite some time now. Will stop Norvasc change atenolol to metoprolol  withholding parameters and hold the Benicar for now. Watch for signs of sepsis blood pressure improved with IV fluids  . 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 Anemia of chronic disease -  stable although somewhat below baseline continue to follow  . Alcoholism in remission - currently states have not been drinking for the past 8 months  . Atrial flutter by electrocardiogram - patient has no history of atrial flutter or fibrillation. Given pulmonary disease and dehydration probably contributing factors. We'll obtain echo gram,  check TSH. Continue beta blocker. If this persists or recurs or if abnormal echo gram noted anticoagulation shouldn't be considered.   Prophylaxis: Lovenox, Protonix  CODE STATUS:  FULL CODE as per patient's wishes  Other plan as per orders.  I have spent a total of 55 min on this admission  Anthony Curry 07/25/2014, 8:13 PM  [facility]  Pager [phone]   If 7AM-7PM, please contact the day team taking care of the patient  [email or web address]  Password [email or web address]

## 2014-07-26 ENCOUNTER — Encounter (HOSPITAL_COMMUNITY): Payer: Self-pay | Admitting: Internal Medicine

## 2014-07-26 DIAGNOSIS — I4891 Unspecified atrial fibrillation: Secondary | ICD-10-CM

## 2014-07-26 DIAGNOSIS — I319 Disease of pericardium, unspecified: Secondary | ICD-10-CM

## 2014-07-26 LAB — TSH: TSH: 1.7 u[IU]/mL (ref 0.350–4.500)

## 2014-07-26 LAB — TROPONIN I
Troponin I: <0.3 ng/mL (ref <0.30)
Troponin I: <0.3 ng/mL (ref <0.30)
Troponin I: <0.3 ng/mL (ref <0.30)

## 2014-07-26 LAB — CBC WITH DIFFERENTIAL/PLATELET
BASOS PCT: 0 % (ref 0–1)
Basophils Absolute: 0 10*3/uL (ref 0.0–0.1)
EOS ABS: 0.1 10*3/uL (ref 0.0–0.7)
Eosinophils Relative: 3 % (ref 0–5)
HEMATOCRIT: 23.9 % — AB (ref 39.0–52.0)
HEMOGLOBIN: 8.8 g/dL — AB (ref 13.0–17.0)
Lymphocytes Relative: 23 % (ref 12–46)
Lymphs Abs: 0.8 10*3/uL (ref 0.7–4.0)
MCH: 35.2 pg — AB (ref 26.0–34.0)
MCHC: 36.8 g/dL — ABNORMAL HIGH (ref 30.0–36.0)
MCV: 95.6 fL (ref 78.0–100.0)
MONO ABS: 0.6 10*3/uL (ref 0.1–1.0)
MONOS PCT: 17 % — AB (ref 3–12)
NEUTROS PCT: 57 % (ref 43–77)
Neutro Abs: 2 10*3/uL (ref 1.7–7.7)
Platelets: 136 10*3/uL — ABNORMAL LOW (ref 150–400)
RBC: 2.5 MIL/uL — ABNORMAL LOW (ref 4.22–5.81)
RDW: 15.3 % (ref 11.5–15.5)
WBC: 3.5 10*3/uL — ABNORMAL LOW (ref 4.0–10.5)

## 2014-07-26 LAB — COMPREHENSIVE METABOLIC PANEL
ALK PHOS: 80 U/L (ref 39–117)
ALT: 15 U/L (ref 0–53)
AST: 21 U/L (ref 0–37)
Albumin: 2.3 g/dL — ABNORMAL LOW (ref 3.5–5.2)
Anion gap: 11 (ref 5–15)
BUN: 7 mg/dL (ref 6–23)
CO2: 22 mEq/L (ref 19–32)
Calcium: 8.1 mg/dL — ABNORMAL LOW (ref 8.4–10.5)
Chloride: 98 mEq/L (ref 96–112)
Creatinine, Ser: 0.56 mg/dL (ref 0.50–1.35)
GFR calc Af Amer: >90 mL/min (ref >90)
GFR calc non Af Amer: >90 mL/min (ref >90)
GLUCOSE: 94 mg/dL (ref 70–99)
POTASSIUM: 3.7 meq/L (ref 3.7–5.3)
Sodium: 131 mEq/L — ABNORMAL LOW (ref 137–147)
Total Bilirubin: 0.4 mg/dL (ref 0.3–1.2)
Total Protein: 5.1 g/dL — ABNORMAL LOW (ref 6.0–8.3)

## 2014-07-26 LAB — STREP PNEUMONIAE URINARY ANTIGEN: Strep Pneumo Urinary Antigen: NEGATIVE

## 2014-07-26 LAB — MRSA PCR SCREENING: MRSA by PCR: NEGATIVE

## 2014-07-26 MED ORDER — DILTIAZEM HCL ER COATED BEADS 360 MG PO CP24
360.0000 mg | ORAL_CAPSULE | Freq: Every day | ORAL | Status: DC
  Administered 2014-07-26 – 2014-07-28 (×3): 360 mg via ORAL
  Filled 2014-07-26 (×2): qty 2
  Filled 2014-07-26: qty 1

## 2014-07-26 MED ORDER — DIGOXIN 0.25 MG/ML IJ SOLN
0.2500 mg | Freq: Once | INTRAMUSCULAR | Status: AC
  Administered 2014-07-26: 0.25 mg via INTRAVENOUS
  Filled 2014-07-26: qty 1

## 2014-07-26 MED ORDER — TAMSULOSIN HCL 0.4 MG PO CAPS
0.4000 mg | ORAL_CAPSULE | Freq: Every day | ORAL | Status: DC
  Administered 2014-07-27 – 2014-07-29 (×3): 0.4 mg via ORAL
  Filled 2014-07-26 (×3): qty 1

## 2014-07-26 MED ORDER — DIGOXIN 125 MCG PO TABS
0.1250 mg | ORAL_TABLET | Freq: Every day | ORAL | Status: DC
  Administered 2014-07-27 – 2014-07-29 (×3): 0.125 mg via ORAL
  Filled 2014-07-26 (×3): qty 1

## 2014-07-26 MED ORDER — DEXTROSE 5 % IV SOLN
5.0000 mg/h | INTRAVENOUS | Status: DC
  Administered 2014-07-26: 5 mg/h via INTRAVENOUS
  Filled 2014-07-26 (×2): qty 100

## 2014-07-26 MED ORDER — METOPROLOL TARTRATE 12.5 MG HALF TABLET
12.5000 mg | ORAL_TABLET | Freq: Four times a day (QID) | ORAL | Status: DC
  Administered 2014-07-26 – 2014-07-27 (×4): 12.5 mg via ORAL
  Filled 2014-07-26 (×4): qty 1

## 2014-07-26 MED ORDER — SODIUM CHLORIDE 0.9 % IV SOLN
INTRAVENOUS | Status: AC
  Administered 2014-07-26: 10:00:00 via INTRAVENOUS

## 2014-07-26 NOTE — Consult Note (Addendum)
Primary Care Physician: [name], MD Referring Physician:  Dr Boykin Peek Montini is a 73 y.o. male with a h/o persistent atrial fibrillation and lung ca.  He is admitted with progressive symptoms of fatigue in the setting of afib with RVR.  He was diagnosed with afib 5/15 after presenting to the ER.  ER notes suggest follow-up however he has not previously been seen by cardiology.  He reports having BLE for several months which was successfully treated with lasix.  He continues to receive treatment for lung ca with chemotherapy.  Recently, he has had progressive fatigue and decline.  For there past week or so he has been "washed out".  He presented to [facility] as was found to have afib with rapid ventricular rates.  Cardiology has been consulted and he has been initiated on a cardizem drip. He has had cough productive of yellow sputum also.  He has been placed on antibiotics for possible pneumonia.  Today, he denies symptoms of palpitations, chest pain, shortness of breath, orthopnea, PND, lower extremity edema, dizziness, presyncope, syncope, or neurologic sequela. The patient is tolerating medications without difficulties and is otherwise without complaint today.   Past Medical History  Diagnosis Date  . Hypertension   . GERD (gastroesophageal reflux disease)   . Alcohol abuse   . 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 Low sodium   . Cancer     small cell/lung  . Persistent atrial fibrillation 04/11/14    chads2vasc score is 2   Past Surgical History  Procedure Laterality Date  . Hemorrhoid surgery    . Ankle arthrodesis  1995    rt fx-hardware in  . Tonsillectomy    . Colonoscopy    . Excision / curettage bone cyst finger  2005    rt thumb  . Total shoulder arthroplasty  02/03/2012    Procedure: TOTAL SHOULDER ARTHROPLASTY;  Surgeon: [name], MD;  Location: [place];  Service: Orthopedics;  Laterality: Left;    Current  Facility-Administered Medications  Medication Dose Route Frequency Provider Last Rate Last Dose  . 0.9 %  sodium chloride infusion   Intravenous Continuous Nita Sells, MD 100 mL/hr at 07/26/14 0945    . ALPRAZolam (XANAX) tablet 0.25 mg  0.25 mg Oral TID PRN Toy Baker, MD      . ceFEPIme (MAXIPIME) 1 g in dextrose 5 % 50 mL IVPB  1 g Intravenous 3 times per day Toy Baker, MD   1 g at 07/26/14 0640  . demeclocycline (DECLOMYCIN) tablet 150 mg  150 mg Oral BID Toy Baker, MD   150 mg at 07/26/14 0945  . [START ON 07/27/2014] digoxin (LANOXIN) tablet 0.125 mg  0.125 mg Oral Daily Nita Sells, MD      . diltiazem (CARDIZEM) 100 mg in dextrose 5 % 100 mL (1 mg/mL) infusion  5-15 mg/hr Intravenous Titrated Nita Sells, MD 10 mL/hr at 07/26/14 1230 10 mg/hr at 07/26/14 1230  . enoxaparin (LOVENOX) injection 40 mg  40 mg Subcutaneous QHS Toy Baker, MD   40 mg at 07/25/14 2319  . feeding supplement (ENSURE COMPLETE) (ENSURE COMPLETE) liquid 237 mL  237 mL Oral Q24H Toy Baker, MD   237 mL at 07/25/14 2245  . pantoprazole (PROTONIX) EC tablet 40 mg  40 mg Oral Daily Toy Baker, MD   40 mg at 07/26/14 0913  . [START ON 07/27/2014] tamsulosin (FLOMAX) capsule 0.4 mg  0.4 mg Oral Daily Nita Sells, MD      .  thiamine (VITAMIN B-1) tablet 100 mg  100 mg Oral Daily Toy Baker, MD   100 mg at 07/26/14 0913  . vancomycin (VANCOCIN) IVPB 750 mg/150 ml premix  750 mg Intravenous BID Toy Baker, MD   750 mg at 07/26/14 0913    No Known Allergies  History   Social History  . Marital Status: Married    Spouse Name: N/A    Number of Children: N/A  . Years of Education: N/A   Occupational History  . retired    Social History Main Topics  . Smoking status: Former Smoker -- 2.00 packs/day for 30 years    Types: Cigarettes    Quit date: 01/27/1984  . Smokeless tobacco: Not on file  . Alcohol Use: No  . Drug Use:  No  . Sexual Activity: Not on file   Other Topics Concern  . Not on file   Social History Narrative   Retired from [employer] History  Problem Relation Age of Onset  . Brain cancer Brother   . Lung cancer Mother   . Hypertension Father   . Diabetes Paternal Grandmother     ROS- All systems are reviewed and negative except as per the HPI above  Physical Exam: Filed Vitals:   07/26/14 1130 07/26/14 1200 07/26/14 1230 07/26/14 1300  BP: 121/76 120/72 110/61   Pulse:  135 78 34  Temp:  98.7 F (37.1 C)    TempSrc:  Oral    Resp: 20 21 29 25   Height:      Weight:      SpO2: 95% 97% 97% 99%    GEN- The patient is tired appearing, alert and oriented x 3 today.   Head- normocephalic, atraumatic Eyes-  Sclera clear, conjunctiva pink Ears- hearing intact Oropharynx- clear Neck- supple, no JVP Lungs- decreased BS at the bases, normal work of breathing Heart- tachycardic irregular rhythm, 1/6 SEM at the apex GI- soft, NT, ND, + BS Extremities- no clubbing, cyanosis, or edema MS- no significant deformity or atrophy Skin- no rash or lesion Psych- euthymic mood, full affect Neuro- strength and sensation are intact  EKG reveals afib with RVR, no ischemic changes Cxr, CT, echo and epic records are reviewed in detail  TSH 1.7  Assessment and Plan:  afib with RVR The patient was initially diagnosed in afib 5/15 in the ER but does not appear to have had this addressed since.  He has at least moderate LA enlargement by echo with preserved EF.  I suspect that much of his current presentation is related to his afib with RVR.  I also suspect that he has been in afib much longer than he knows. Fortunately, his EF remains preserved.  Medical therapies are complicated by lung Ca and ongiong chemotherapy.  The patients chads2vasc score is at least 2.  I would therefore recommend long term anticoagulation if not contraindicated.  Given his anemia (likely related to chemotherapy), I  think that the primary team should discuss with Dr Worthy Flank team prior to initiation of anticoagulation.    For now, we will focus our efforts on rate control until anticoagulation can be initiated.  I will convert IV cardizem to oral diltiazem at this time.  Continue digoxin for now.  In addition, I will titrate metoprolol as needed for rate control.  I will defer amiodarone for rate control given lung disease but may have to consider this if we cannot otherwise control his v rates.  Ultimately, I think  that his prognosis may be poor given advanced lung ca, hyponatremia, afib, etc.  Cardiology to follow while here

## 2014-07-26 NOTE — Progress Notes (Signed)
Note: This document was prepared with digital dictation and possible smart phrase technology. Any transcriptional errors that result from this process are unintentional.   Edyn Qazi YSA:[id number] DOB: 09/01/1941 DOA: 07/25/2014 PCP: [name], MD  Brief narrative: 73 y/o ?, known ho Stage IV (T1 A., N0, M1 B.) non-small cell lung cancer, prior heavy ETOH use, HTn, BPH, Gerd, prior hyponatremnia thought to be from ETOH use admitted to [facility] with CAP and hypotension and concern for sepsis  Past medical history-As per Problem list Chart reviewed as below- reviewed  Consultants:   none yet  Procedures:  XCXR  Echo pending  Antibiotics:  Cefepime, demeclocyclin, Levaquin, vancomycin 8/21  Vancomycin 8/21  Cefepime 8/21   Subjective  Doing fair. Alert oriented eating better Chest pain shortness of breath not aware of atrial flutter/fib tachycardia No prior heart disease No current chest pain or shortness of breath Has not been eating well for the past couple of weeks    Objective    Interim History: Reviewed  Telemetry: Atrial fibrillation as high as 150 with moving around   Objective: Filed Vitals:   07/26/14 0000 07/26/14 0001 07/26/14 0400 07/26/14 0645  BP: 104/73 104/73 107/61 92/59  Pulse:  127 140 70  Temp: 98.1 F (36.7 C)  98.5 F (36.9 C)   TempSrc: Oral  Oral   Resp: 21  19   Height:      Weight:   70.2 kg (154 lb 12.2 oz)   SpO2:   97%     Intake/Output Summary (Last 24 hours) at 07/26/14 0837 Last data filed at 07/26/14 0640  Gross per 24 hour  Intake 1133.33 ml  Output    995 ml  Net 138.33 ml    Exam:  General: Alert doesn't oriented no apparent distress Cardiovascular: S1-S2 no murmur rub or gallop irregularly irregular Respiratory: Clinically clear no added sounds Abdomen: Soft nontender nondistended Skin tattoos over upper extremities Neuro intact  Data Reviewed: Basic Metabolic Panel:  Recent Labs Lab  07/25/14 1732 07/26/14 0613  NA 121* 131*  K 4.2 3.7  CL 84* 98  CO2 25 22  GLUCOSE 109* 94  BUN 10 7  CREATININE 0.70 0.56  CALCIUM 9.1 8.1*   Liver Function Tests:  Recent Labs Lab 07/25/14 1732 07/26/14 0613  AST 30 21  ALT 20 15  ALKPHOS 108 80  BILITOT 0.6 0.4  PROT 6.5 5.1*  ALBUMIN 3.0* 2.3*   No results found for this basename: LIPASE, AMYLASE,  in the last 168 hours No results found for this basename: AMMONIA,  in the last 168 hours CBC:  Recent Labs Lab 07/25/14 1732 07/26/14 0613  WBC 4.8 3.5*  NEUTROABS 2.9 2.0  HGB 10.1* 8.8*  HCT 27.8* 23.9*  MCV 95.2 95.6  PLT 209 136*   Cardiac Enzymes:  Recent Labs Lab 07/26/14 0050 07/26/14 0613  TROPONINI <0.30 <0.30   BNP: No components found with this basename: POCBNP,  CBG: No results found for this basename: GLUCAP,  in the last 168 hours  Recent Results (from the past 240 hour(s))  MRSA PCR SCREENING     Status: None   Collection Time    07/25/14 10:53 PM      Result Value Ref Range Status   MRSA by PCR NEGATIVE  NEGATIVE Final   Comment:            The GeneXpert MRSA Assay (FDA     approved for NASAL specimens  only), is one component of a     comprehensive MRSA colonization     surveillance program. It is not     intended to diagnose MRSA     infection nor to guide or     monitor treatment for     MRSA infections.     Studies:              All Imaging reviewed and is as per above notation   Scheduled Meds: . amoxicillin-clavulanate  1 tablet Oral Daily  . ceFEPime (MAXIPIME) IV  1 g Intravenous 3 times per day  . demeclocycline  150 mg Oral BID  . enoxaparin (LOVENOX) injection  40 mg Subcutaneous QHS  . feeding supplement (ENSURE COMPLETE)  237 mL Oral Q24H  . metoprolol tartrate  12.5 mg Oral 4 times per day  . pantoprazole  40 mg Oral Daily  . tamsulosin  0.4 mg Oral BID  . thiamine  100 mg Oral Daily  . vancomycin  750 mg Intravenous BID   Continuous Infusions:     Assessment/Plan: 1. P AFib-could continue metoprolol 12.5 every 6 hours. If this does not control, we'll place him on Cardizem drip and cardiology will be consulted for help with management. I suspect he would best be served by digoxin more so than amiodarone as he has lung disease. If this is persistent we will need to calculate his child score and consider systemic anticoagulation.  Await echocardiogram.  Follow TSH ordered 2. Sepsis secondary to Community acquired pneumonia continue vancomycin/cefepime. Repeat CBC plus differential in a.m.. Antibiotics needed for 7-10 days likely 3. Hypotension-secondary to sepsis, volume depletion as well as use of home medications atenolol 100 daily, amlodipine 10 daily, Benicar 40 daily which evolved into discontinued-continue hydration at 100 cc per hour overnight. Will need to monitor 4. Chronic Hyponatremia with superimposed acute compnenet-Demclocycline 150 bid re-ordered.  Slow hydration to 100 cc/hr.  Monitor labs in am 5. Gerd-continue Pantoprazole 40 daily 6. BPH-continue Flomax 0.4 daily.  Doesn't need this  bid  Code Status: full Family Communication:  discuseed with wife and family at bedsdie Disposition Plan: inpatient, SDU   Verneita Griffes, MD  [facility] Pager [phone] 07/26/2014, 8:37 AM    LOS: 1 day

## 2014-07-26 NOTE — Progress Notes (Signed)
Echocardiogram 2D Echocardiogram has been performed.  Anthony Curry 07/26/2014, 11:16 AM

## 2014-07-27 ENCOUNTER — Other Ambulatory Visit (HOSPITAL_COMMUNITY): Payer: Medicare Other

## 2014-07-27 LAB — BASIC METABOLIC PANEL
Anion gap: 11 (ref 5–15)
BUN: 5 mg/dL — ABNORMAL LOW (ref 6–23)
CHLORIDE: 93 meq/L — AB (ref 96–112)
CO2: 24 meq/L (ref 19–32)
Calcium: 8.6 mg/dL (ref 8.4–10.5)
Creatinine, Ser: 0.59 mg/dL (ref 0.50–1.35)
GFR calc Af Amer: >90 mL/min (ref >90)
GFR calc non Af Amer: >90 mL/min (ref >90)
GLUCOSE: 118 mg/dL — AB (ref 70–99)
POTASSIUM: 4 meq/L (ref 3.7–5.3)
SODIUM: 128 meq/L — AB (ref 137–147)

## 2014-07-27 LAB — CBC WITH DIFFERENTIAL/PLATELET
Basophils Absolute: 0 10*3/uL (ref 0.0–0.1)
Basophils Relative: 0 % (ref 0–1)
Eosinophils Absolute: 0.1 10*3/uL (ref 0.0–0.7)
Eosinophils Relative: 2 % (ref 0–5)
HEMATOCRIT: 26.3 % — AB (ref 39.0–52.0)
Hemoglobin: 9.6 g/dL — ABNORMAL LOW (ref 13.0–17.0)
LYMPHS ABS: 1.1 10*3/uL (ref 0.7–4.0)
LYMPHS PCT: 19 % (ref 12–46)
MCH: 35.3 pg — ABNORMAL HIGH (ref 26.0–34.0)
MCHC: 36.5 g/dL — ABNORMAL HIGH (ref 30.0–36.0)
MCV: 96.7 fL (ref 78.0–100.0)
MONO ABS: 0.7 10*3/uL (ref 0.1–1.0)
Monocytes Relative: 11 % (ref 3–12)
Neutro Abs: 3.9 10*3/uL (ref 1.7–7.7)
Neutrophils Relative %: 67 % (ref 43–77)
Platelets: 182 10*3/uL (ref 150–400)
RBC: 2.72 MIL/uL — AB (ref 4.22–5.81)
RDW: 15.5 % (ref 11.5–15.5)
WBC: 5.8 10*3/uL (ref 4.0–10.5)

## 2014-07-27 LAB — LEGIONELLA ANTIGEN, URINE: LEGIONELLA ANTIGEN, URINE: NEGATIVE

## 2014-07-27 LAB — PRO B NATRIURETIC PEPTIDE: PRO B NATRI PEPTIDE: 1683 pg/mL — AB (ref 0–125)

## 2014-07-27 LAB — CLOSTRIDIUM DIFFICILE BY PCR: CDIFFPCR: NEGATIVE

## 2014-07-27 LAB — MAGNESIUM: Magnesium: 1.5 mg/dL (ref 1.5–2.5)

## 2014-07-27 MED ORDER — METOPROLOL TARTRATE 12.5 MG HALF TABLET
12.5000 mg | ORAL_TABLET | Freq: Two times a day (BID) | ORAL | Status: DC
Start: 2014-07-27 — End: 2014-07-27

## 2014-07-27 MED ORDER — FUROSEMIDE 10 MG/ML IJ SOLN
20.0000 mg | Freq: Two times a day (BID) | INTRAMUSCULAR | Status: DC
  Administered 2014-07-27 (×2): 20 mg via INTRAVENOUS
  Filled 2014-07-27 (×4): qty 2

## 2014-07-27 MED ORDER — ENSURE COMPLETE PO LIQD
237.0000 mL | Freq: Two times a day (BID) | ORAL | Status: DC
  Administered 2014-07-27 – 2014-07-29 (×4): 237 mL via ORAL

## 2014-07-27 MED ORDER — METOPROLOL TARTRATE 25 MG PO TABS
25.0000 mg | ORAL_TABLET | Freq: Two times a day (BID) | ORAL | Status: DC
  Administered 2014-07-27 – 2014-07-29 (×4): 25 mg via ORAL
  Filled 2014-07-27 (×5): qty 1

## 2014-07-27 NOTE — Progress Notes (Signed)
INITIAL NUTRITION ASSESSMENT  DOCUMENTATION CODES Per approved criteria  -Not Applicable   INTERVENTION: -Increase Ensure Complete to BID, each provides 350 kcal, 13 g protein.  -Patient educated on choosing soft, easy to chew foods from the menu.  NUTRITION DIAGNOSIS: Inadequate oral intake related to chewing difficulty as evidenced by patient report.   Goal: Patient will meet >/=90% of estimated nutrition needs  Monitor:  PO intake, weight, labs, chewing ability  Reason for Assessment: Malnutrition screening tool  73 y.o. male  Admitting Dx: Hyponatremia  ASSESSMENT: 73 y.o. 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 Patient is being actively treated for non-small cell lung cancer last chemo was 8/12.  Patient reports that he was doing well maintaining his weight around 160 pounds for a while, but has recently lost about 3 pounds in the last month due to decreased PO intake secondary to chewing difficulty. Weight loss is not significant over time frame. He states that he has to chew carefully, and this has decreased his intake. He was supposed to be fit for a new dental bridge tomorrow, but that is now on hold due to hospital admission. He does drink Ensure shakes once daily at home, but would like to increase that to twice daily here. Patient and family educated on choosing soft, easy to chew foods from the menu.   Height: Ht Readings from Last 1 Encounters:  07/25/14 6' (1.829 m)    Weight: Wt Readings from Last 1 Encounters:  07/27/14 157 lb 6.5 oz (71.4 kg)    Ideal Body Weight: 178 pounds  % Ideal Body Weight: 88%  Wt Readings from Last 10 Encounters:  07/27/14 157 lb 6.5 oz (71.4 kg)  07/16/14 157 lb 8 oz (71.442 kg)  06/25/14 161 lb 6.4 oz (73.211 kg)  06/10/14 161 lb 3.2 oz (73.12 kg)  06/04/14 161 lb 8 oz (73.256 kg)  05/14/14 163  lb 8 oz (74.163 kg)  05/01/14 159 lb 6.4 oz (72.303 kg)  04/23/14 167 lb 14.4 oz (76.159 kg)  04/02/14 169 lb 12.8 oz (77.021 kg)  03/12/14 169 lb 9.6 oz (76.93 kg)    Usual Body Weight: 160 pounds  % Usual Body Weight: 98%  BMI:  Body mass index is 21.34 kg/(m^2). Patient is normal weight  Estimated Nutritional Needs: Kcal: 1950-2100 kcal Protein: 90-105 g Fluid: >2.1 L/day  Skin: Intact  Diet Order: Cardiac  EDUCATION NEEDS: -No education needs identified at this time   Intake/Output Summary (Last 24 hours) at 07/27/14 1212 Last data filed at 07/27/14 1200  Gross per 24 hour  Intake 2173.58 ml  Output    900 ml  Net 1273.58 ml    Last BM: 8/22   Labs:   Recent Labs Lab 07/25/14 1732 07/26/14 0613 07/27/14 0347  NA 121* 131* 128*  K 4.2 3.7 4.0  CL 84* 98 93*  CO2 25 22 24   BUN 10 7 5*  CREATININE 0.70 0.56 0.59  CALCIUM 9.1 8.1* 8.6  MG  --   --  1.5  GLUCOSE 109* 94 118*    CBG (last 3)  No results found for this basename: GLUCAP,  in the last 72 hours  Scheduled Meds: . ceFEPime (MAXIPIME) IV  1 g Intravenous 3 times per day  . demeclocycline  150 mg Oral BID  . digoxin  0.125 mg Oral Daily  . diltiazem  360 mg Oral Daily  .  enoxaparin (LOVENOX) injection  40 mg Subcutaneous QHS  . feeding supplement (ENSURE COMPLETE)  237 mL Oral Q24H  . furosemide  20 mg Intravenous Q12H  . metoprolol tartrate  25 mg Oral BID  . pantoprazole  40 mg Oral Daily  . tamsulosin  0.4 mg Oral Daily  . thiamine  100 mg Oral Daily  . vancomycin  750 mg Intravenous BID    Continuous Infusions:   Past Medical History  Diagnosis Date  . Hypertension   . GERD (gastroesophageal reflux disease)   . Alcohol abuse   . 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 Low sodium   . Cancer     small cell/lung  . Persistent atrial fibrillation 04/11/14    chads2vasc score is 2    Past Surgical History  Procedure Laterality Date  . Hemorrhoid surgery    . Ankle  arthrodesis  1995    rt fx-hardware in  . Tonsillectomy    . Colonoscopy    . Excision / curettage bone cyst finger  2005    rt thumb  . Total shoulder arthroplasty  02/03/2012    Procedure: TOTAL SHOULDER ARTHROPLASTY;  Surgeon: [name], MD;  Location: [facility];  Service: Orthopedics;  Laterality: Left;    Larey Seat, RD, LDN Pager #: [phone] After-Hours Pager #: [phone]

## 2014-07-27 NOTE — Progress Notes (Signed)
Note: This document was prepared with digital dictation and possible smart phrase technology. Any transcriptional errors that result from this process are unintentional.   Alamin Mccuiston WGN:[id number] DOB: [birth year] DOA: 07/25/2014 PCP: [name], MD  Brief narrative: 73 y/o ?, known ho Stage IV (T1 A., N0, M1 B.) non-small cell lung cancer, prior heavy ETOH use, HTn, BPH, Gerd, prior hyponatremnia thought to be from ETOH use admitted to [facility] with CAP and hypotension and concern for sepsis  Past medical history-As per Problem list Chart reviewed as below- reviewed  Consultants:   none yet  Procedures:  XCXR  Echo pending  Antibiotics:  Cefepime, demeclocyclin, Levaquin, vancomycin 8/21  Vancomycin 8/21  Cefepime 8/21   Subjective  Feels ok, still coughing, breathing better, ongoing weakness and dyspnea for weeks    Objective    Interim History: Reviewed   Objective: Filed Vitals:   07/27/14 0400 07/27/14 0424 07/27/14 0519 07/27/14 0600  BP: 122/70   102/47  Pulse: 102   61  Temp:   98.9 F (37.2 C)   TempSrc:   Oral   Resp: 24   24  Height:      Weight:  71.4 kg (157 lb 6.5 oz)    SpO2: 96%   96%    Intake/Output Summary (Last 24 hours) at 07/27/14 0754 Last data filed at 07/26/14 2016  Gross per 24 hour  Intake 1640.91 ml  Output    300 ml  Net 1340.91 ml    Exam:  General: AAOx3, no distress Cardiovascular: S1-S2/Irregular rate and rhythm Respiratory: diminished BS at bases Abdomen: Soft nontender nondistended Skin tattoos over upper extremities Ext: no edema c/c  Data Reviewed: Basic Metabolic Panel:  Recent Labs Lab 07/25/14 1732 07/26/14 0613 07/27/14 0347  NA 121* 131* 128*  K 4.2 3.7 4.0  CL 84* 98 93*  CO2 25 22 24   GLUCOSE 109* 94 118*  BUN 10 7 5*  CREATININE 0.70 0.56 0.59  CALCIUM 9.1 8.1* 8.6   Liver Function Tests:  Recent Labs Lab 07/25/14 1732 07/26/14 0613  AST 30 21  ALT 20 15  ALKPHOS  108 80  BILITOT 0.6 0.4  PROT 6.5 5.1*  ALBUMIN 3.0* 2.3*   No results found for this basename: LIPASE, AMYLASE,  in the last 168 hours No results found for this basename: AMMONIA,  in the last 168 hours CBC:  Recent Labs Lab 07/25/14 1732 07/26/14 0613 07/27/14 0347  WBC 4.8 3.5* 5.8  NEUTROABS 2.9 2.0 3.9  HGB 10.1* 8.8* 9.6*  HCT 27.8* 23.9* 26.3*  MCV 95.2 95.6 96.7  PLT 209 136* 182   Cardiac Enzymes:  Recent Labs Lab 07/26/14 0050 07/26/14 0613 07/26/14 1146  TROPONINI <0.30 <0.30 <0.30   BNP: No components found with this basename: POCBNP,  CBG: No results found for this basename: GLUCAP,  in the last 168 hours  Recent Results (from the past 240 hour(s))  CULTURE, BLOOD (ROUTINE X 2)     Status: None   Collection Time    07/25/14  6:39 PM      Result Value Ref Range Status   Specimen Description BLOOD LEFT ARM   Final   Special Requests BOTTLES DRAWN AEROBIC AND ANAEROBIC 5CC   Final   Culture  Setup Time     Final   Value: 07/25/2014 22:39     Performed at [facility]   Culture     Final   Value:  BLOOD CULTURE RECEIVED NO GROWTH TO DATE CULTURE WILL BE HELD FOR 5 DAYS BEFORE ISSUING A FINAL NEGATIVE REPORT     Performed at [facility]   Report Status PENDING   Incomplete  CULTURE, BLOOD (ROUTINE X 2)     Status: None   Collection Time    07/25/14  6:39 PM      Result Value Ref Range Status   Specimen Description BLOOD RIGHT ARM   Final   Special Requests BOTTLES DRAWN AEROBIC AND ANAEROBIC 5CC   Final   Culture  Setup Time     Final   Value: 07/25/2014 22:38     Performed at [facility]   Culture     Final   Value:        BLOOD CULTURE RECEIVED NO GROWTH TO DATE CULTURE WILL BE HELD FOR 5 DAYS BEFORE ISSUING A FINAL NEGATIVE REPORT     Performed at [facility]   Report Status PENDING   Incomplete  MRSA PCR SCREENING     Status: None   Collection Time    07/25/14 10:53 PM      Result Value Ref Range  Status   MRSA by PCR NEGATIVE  NEGATIVE Final   Comment:            The GeneXpert MRSA Assay (FDA     approved for NASAL specimens     only), is one component of a     comprehensive MRSA colonization     surveillance program. It is not     intended to diagnose MRSA     infection nor to guide or     monitor treatment for     MRSA infections.     Studies:              All Imaging reviewed and is as per above notation   Scheduled Meds: . ceFEPime (MAXIPIME) IV  1 g Intravenous 3 times per day  . demeclocycline  150 mg Oral BID  . digoxin  0.125 mg Oral Daily  . diltiazem  360 mg Oral Daily  . enoxaparin (LOVENOX) injection  40 mg Subcutaneous QHS  . feeding supplement (ENSURE COMPLETE)  237 mL Oral Q24H  . furosemide  20 mg Intravenous Q12H  . metoprolol tartrate  12.5 mg Oral Q6H  . pantoprazole  40 mg Oral Daily  . tamsulosin  0.4 mg Oral Daily  . thiamine  100 mg Oral Daily  . vancomycin  750 mg Intravenous BID   Continuous Infusions:    Assessment/Plan: 1. AFib with RVR       -rate controlled now       -continue Po cardizem, also on Digoxin and q6 metoprolol, change to BID, anticoagulation if ok with Dr.Mohamed on Monday,       -cards following  2. Community acquired pneumonia continue vancomycin/cefepime.        -FU cultures, transtion to PO in am if stable  3. Pleural effusions     -moderate on R and small on left on Ct chest 8/21     -check BNP, ECHo with preserved EF, was taking lasix at home PRN, will resume at low dose IV now     -could be parapenumonic vs malignant effusion     -repeat CXR in am     -if persistent despite medical Rx, could benefit from Thoracentesis, CXR in am  3. Hypotension-secondary to sepsis, Meds etc     -transient resolved, stop  IVF esp given effusions     -continue meds for Afib  4. Chronic Hyponatremia with superimposed acute compnenet- onDemclocycline 150 bid       -monitor with diuresis  5. Gerd-continue Pantoprazole 40  daily  BPH-continue Flomax 0.4 daily.    Code Status: full Family Communication:  discuseed with wife and family at bedsdie Disposition Plan: inpatient, SDU   Domenic Polite, MD  [facility] Pager [phone] 07/27/2014, 7:54 AM    LOS: 2 days

## 2014-07-27 NOTE — Progress Notes (Addendum)
SUBJECTIVE: The patient is doing well today.  At this time, he denies chest pain, shortness of breath, or any new concerns.  His heart rates are much improved.  [name] ceFEPime (MAXIPIME) IV  1 g Intravenous 3 times per day  . demeclocycline  150 mg Oral BID  . digoxin  0.125 mg Oral Daily  . diltiazem  360 mg Oral Daily  . enoxaparin (LOVENOX) injection  40 mg Subcutaneous QHS  . feeding supplement (ENSURE COMPLETE)  237 mL Oral Q24H  . furosemide  20 mg Intravenous Q12H  . metoprolol tartrate  12.5 mg Oral BID  . pantoprazole  40 mg Oral Daily  . tamsulosin  0.4 mg Oral Daily  . thiamine  100 mg Oral Daily  . vancomycin  750 mg Intravenous BID      OBJECTIVE: Physical Exam: Filed Vitals:   07/27/14 0519 07/27/14 0600 07/27/14 0758 07/27/14 0800  BP:  102/47 108/37 108/37  Pulse:  61 90 69  Temp: 98.9 F (37.2 C)     TempSrc: Oral     Resp:  24  24  Height:      Weight:      SpO2:  96%  98%    Intake/Output Summary (Last 24 hours) at 07/27/14 0813 Last data filed at 07/26/14 2016  Gross per 24 hour  Intake 1640.91 ml  Output    300 ml  Net 1340.91 ml    Telemetry reveals afib, V rates are 80-120 bpm GEN- The patient is chronically ill appearing, alert and oriented x 3 today.  Head- normocephalic, atraumatic  Eyes- Sclera clear, conjunctiva pink  Ears- hearing intact  Oropharynx- clear  Neck- supple, no JVP  Lungs- decreased BS at the bases, normal work of breathing  Heart- tachycardic irregular rhythm, 1/6 SEM at the apex  GI- soft, NT, ND, + BS  Extremities- no clubbing, cyanosis, or edema  Neuro- strength and sensation are intact   LABS: Basic Metabolic Panel:  Recent Labs  07/26/14 0613 07/27/14 0347  NA 131* 128*  K 3.7 4.0  CL 98 93*  CO2 22 24  GLUCOSE 94 118*  BUN 7 5*  CREATININE 0.56 0.59  CALCIUM 8.1* 8.6   Liver Function Tests:  Recent Labs  07/25/14 1732 07/26/14 0613  AST 30 21  ALT 20 15  ALKPHOS 108 80  BILITOT 0.6 0.4    PROT 6.5 5.1*  ALBUMIN 3.0* 2.3*   No results found for this basename: LIPASE, AMYLASE,  in the last 72 hours CBC:  Recent Labs  07/26/14 0613 07/27/14 0347  WBC 3.5* 5.8  NEUTROABS 2.0 3.9  HGB 8.8* 9.6*  HCT 23.9* 26.3*  MCV 95.6 96.7  PLT 136* 182   Cardiac Enzymes:  Recent Labs  07/26/14 0050 07/26/14 0613 07/26/14 1146  TROPONINI <0.30 <0.30 <0.30   Thyroid Function Tests:  Recent Labs  07/26/14 0613  TSH 1.700   Anemia Panel: No results found for this basename: VITAMINB12, FOLATE, FERRITIN, TIBC, IRON, RETICCTPCT,  in the last 72 hours  RADIOLOGY: Dg Chest 2 View  07/25/2014   CLINICAL DATA:  Productive cough, lung cancer  EXAM: CHEST  2 VIEW  COMPARISON:  06/02/2014  FINDINGS: Significant degenerative changes right shoulder again noted. Multiple old right rib fractures. No pulmonary edema. There is small right pleural effusion with right base posterior atelectasis or infiltrate. No pulmonary edema. A small nodule in left apex is stable from prior exam.  IMPRESSION: Small right pleural effusion with  right base posterior atelectasis or infiltrate. No pulmonary edema. Multiple old right rib fractures. Degenerative changes right shoulder.   Electronically Signed   By: Lahoma Crocker M.D.   On: 07/25/2014 18:13   Ct Angio Chest Pe W/cm &/or Wo Cm  07/25/2014   CLINICAL DATA:  73 year old male with increasing shortness of breath. Initial encounter. Lung cancer.  EXAM: CT ANGIOGRAPHY CHEST WITH CONTRAST  TECHNIQUE: Multidetector CT imaging of the chest was performed using the standard protocol during bolus administration of intravenous contrast. Multiplanar CT image reconstructions and MIPs were obtained to evaluate the vascular anatomy.  CONTRAST:  186mL OMNIPAQUE IOHEXOL 350 MG/ML SOLN  COMPARISON:  Chest CT 06/02/2014 and earlier.  FINDINGS: Good contrast bolus timing in the pulmonary arterial tree. Respiratory motion artifact at the lung bases. No focal filling defect  identified in the pulmonary arterial tree to suggest the presence of acute pulmonary embolism.Intermittent retained secretions mostly along the anterior trachea (series 11, image 28). Increased layering right pleural effusion, now moderate. Trace layering left pleural effusion is new.  Spiculated left apical lung nodule is mildly increased measuring 22 x 12 mm on axial images (20 x 9 mm previously). Apical paraseptal emphysema. Increased pulmonary ground-glass opacity, mostly peri-bronchovascular in nature, with continued more focal areas of irregular opacity as documented in June. Increased lower lobe atelectasis. Bilateral peribronchial thickening. No consolidation.  Evidence of a small pericardial effusion along the posterior heart border, probably inconsequential but appears increased. Calcified atherosclerosis of the aorta. Patent visualized major aortic branches. Stable thoracic inlet. No mediastinal or hilar lymphadenopathy.  Stable visualized liver parenchyma. Cholelithiasis. Stable visualized spleen and pancreas. Stable right adrenal gland and visualized kidneys. Left adrenal mass is stable measuring 24 mm diameter. Grossly negative visible bowel in the upper abdomen.  Advanced degenerative changes in the spine. Multiple bilateral chronic rib fractures, some ununited. Advanced probable degenerative osseous changes at the right glenohumeral joint. Sequelae of left glenohumeral arthroplasty. No acute or suspicious osseous lesion identified.  Review of the MIP images confirms the above findings.  IMPRESSION: 1.  No evidence of acute pulmonary embolus. 2. Increased layering pleural effusions, moderate on the right and small on the left. 3. Increased patchy bilateral pulmonary ground-glass opacity suspicious for acute infectious exacerbation superimposed on chronic lung disease. 4. Stable to mildly increased spiculated left apical lung nodule. Stable left adrenal metastasis.   Electronically Signed   By: Lars Pinks M.D.   On: 07/25/2014 19:16    ASSESSMENT AND PLAN:  Active Problems:   Hyponatremia   Anemia of chronic disease   Hypotension   Lung cancer   HCAP (healthcare-associated pneumonia)   Alcoholism in remission   Atrial flutter by electrocardiogram  1. afib Much better rate controlled (was 150s bpm yesterday) Our goal HR should be less than 100 bpm for the majority of the time, (though with his current health state, if he were in sinus his heart rate would probably be elevated also). I will increase metoprolol to 25mg  BID today.  This can be further titrated if needed. Primary team to discuss anticoagulation with Dr Inda Merlin.  If he is able to tolerate anticoagulation then perhaps eventually we can consider rhythm control at that time.  For now we will continue a rate control strategy.  OK to transfer back to telemetry from my standpoint  Cardiology to follow   Thompson Grayer, MD 07/27/2014 8:13 AM

## 2014-07-27 NOTE — Plan of Care (Signed)
Problem: Phase I Progression Outcomes Goal: OOB as tolerated unless otherwise ordered Outcome: Progressing Up to bedside commode frequently.

## 2014-07-27 NOTE — Progress Notes (Signed)
Handoff report called to [place], Therapist, sports. Patient transferring to [id number].

## 2014-07-28 ENCOUNTER — Inpatient Hospital Stay (HOSPITAL_COMMUNITY): Payer: Medicare Other

## 2014-07-28 DIAGNOSIS — I4891 Unspecified atrial fibrillation: Secondary | ICD-10-CM | POA: Diagnosis present

## 2014-07-28 LAB — OSMOLALITY, URINE: Osmolality, Ur: 312 mOsm/kg — ABNORMAL LOW (ref 390–1090)

## 2014-07-28 LAB — SODIUM, URINE, RANDOM: SODIUM UR: 95 meq/L

## 2014-07-28 LAB — BASIC METABOLIC PANEL
ANION GAP: 12 (ref 5–15)
BUN: 6 mg/dL (ref 6–23)
CO2: 25 meq/L (ref 19–32)
Calcium: 8.7 mg/dL (ref 8.4–10.5)
Chloride: 87 mEq/L — ABNORMAL LOW (ref 96–112)
Creatinine, Ser: 0.61 mg/dL (ref 0.50–1.35)
GFR calc Af Amer: >90 mL/min (ref >90)
GLUCOSE: 105 mg/dL — AB (ref 70–99)
POTASSIUM: 3.7 meq/L (ref 3.7–5.3)
SODIUM: 124 meq/L — AB (ref 137–147)

## 2014-07-28 LAB — MAGNESIUM: Magnesium: 1.3 mg/dL — ABNORMAL LOW (ref 1.5–2.5)

## 2014-07-28 LAB — CREATININE, URINE, RANDOM: Creatinine, Urine: 49.5 mg/dL

## 2014-07-28 MED ORDER — DILTIAZEM HCL ER COATED BEADS 300 MG PO CP24
300.0000 mg | ORAL_CAPSULE | Freq: Every day | ORAL | Status: DC
  Administered 2014-07-29: 300 mg via ORAL
  Filled 2014-07-28: qty 1

## 2014-07-28 MED ORDER — DOXYCYCLINE HYCLATE 100 MG PO TABS
100.0000 mg | ORAL_TABLET | Freq: Two times a day (BID) | ORAL | Status: DC
  Administered 2014-07-28 – 2014-07-29 (×3): 100 mg via ORAL
  Filled 2014-07-28 (×4): qty 1

## 2014-07-28 MED ORDER — FUROSEMIDE 40 MG PO TABS
40.0000 mg | ORAL_TABLET | Freq: Every day | ORAL | Status: DC
  Administered 2014-07-29: 40 mg via ORAL
  Filled 2014-07-28: qty 1

## 2014-07-28 MED ORDER — APIXABAN 5 MG PO TABS
5.0000 mg | ORAL_TABLET | Freq: Two times a day (BID) | ORAL | Status: DC
  Administered 2014-07-28 – 2014-07-29 (×3): 5 mg via ORAL
  Filled 2014-07-28 (×4): qty 1

## 2014-07-28 MED ORDER — MAGNESIUM SULFATE 4000MG/100ML IJ SOLN
4.0000 g | Freq: Once | INTRAMUSCULAR | Status: AC
  Administered 2014-07-28: 4 g via INTRAVENOUS
  Filled 2014-07-28: qty 100

## 2014-07-28 MED ORDER — FUROSEMIDE 40 MG PO TABS
40.0000 mg | ORAL_TABLET | Freq: Two times a day (BID) | ORAL | Status: DC
  Administered 2014-07-28: 40 mg via ORAL
  Filled 2014-07-28 (×3): qty 1

## 2014-07-28 NOTE — Progress Notes (Signed)
Note: This document was prepared with digital dictation and possible smart phrase technology. Any transcriptional errors that result from this process are unintentional.   Anthony Curry NUU:[id number] DOB: 21-May-1941 DOA: 07/25/2014 PCP: [name], MD  Brief narrative: 73 y/o ?, known ho Stage IV (T1 A., N0, M1 B.) non-small cell lung cancer, prior heavy ETOH use, HTn, BPH, Gerd, prior hyponatremnia thought to be from ETOH use admitted to [facility] with CAP and hypotension and concern for sepsis. Found to have new onset rapid atrial fibrillation and medications were adjusted and initially and then cardiology was consulted Transferred out of the unit 8/23 Found to have persisting A. fib still and cardiology consulted for help in management  Past medical history-As per Problem list Chart reviewed as below- reviewed  Consultants:   none yet  Procedures:  XCXR  Echo pending  Antibiotics:  Cefepime, demeclocyclin, Levaquin, vancomycin 8/21  Vancomycin 8/21-8/24  Cefepime 8/21-8/24  Doxycycline 8/24   Subjective  Feels ok,  No cough or cold currently No fever nor chills No CP frustrated at low salt diet-expressed concern he was told just yesterday that he needs this    Objective    Interim History: Reviewed  Sinus Afib-rates 110  Objective: Filed Vitals:   07/28/14 0014 07/28/14 0207 07/28/14 0610 07/28/14 1028  BP: 103/86 111/82 99/53 108/62  Pulse:  78 54   Temp: 99 F (37.2 C) 98.8 F (37.1 C) 98 F (36.7 C)   TempSrc: Oral Oral Oral   Resp:  22 23   Height:      Weight:      SpO2: 97% 98% 94%     Intake/Output Summary (Last 24 hours) at 07/28/14 1117 Last data filed at 07/28/14 0900  Gross per 24 hour  Intake    777 ml  Output   2200 ml  Net  -1423 ml    Exam:  General: AAOx3, no distress Cardiovascular: S1-S2/Irregular rate and rhythm Respiratory: diminished BS at bases Abdomen: Soft nontender nondistended Skin tattoos over  upper extremities Ext: no edema c/c  Data Reviewed: Basic Metabolic Panel:  Recent Labs Lab 07/25/14 1732 07/26/14 0613 07/27/14 0347 07/28/14 0425  NA 121* 131* 128* 124*  K 4.2 3.7 4.0 3.7  CL 84* 98 93* 87*  CO2 25 22 24 25   GLUCOSE 109* 94 118* 105*  BUN 10 7 5* 6  CREATININE 0.70 0.56 0.59 0.61  CALCIUM 9.1 8.1* 8.6 8.7  MG  --   --  1.5 1.3*   Liver Function Tests:  Recent Labs Lab 07/25/14 1732 07/26/14 0613  AST 30 21  ALT 20 15  ALKPHOS 108 80  BILITOT 0.6 0.4  PROT 6.5 5.1*  ALBUMIN 3.0* 2.3*   No results found for this basename: LIPASE, AMYLASE,  in the last 168 hours No results found for this basename: AMMONIA,  in the last 168 hours CBC:  Recent Labs Lab 07/25/14 1732 07/26/14 0613 07/27/14 0347  WBC 4.8 3.5* 5.8  NEUTROABS 2.9 2.0 3.9  HGB 10.1* 8.8* 9.6*  HCT 27.8* 23.9* 26.3*  MCV 95.2 95.6 96.7  PLT 209 136* 182   Cardiac Enzymes:  Recent Labs Lab 07/26/14 0050 07/26/14 0613 07/26/14 1146  TROPONINI <0.30 <0.30 <0.30   BNP: No components found with this basename: POCBNP,  CBG: No results found for this basename: GLUCAP,  in the last 168 hours  Recent Results (from the past 240 hour(s))  CULTURE, BLOOD (ROUTINE X 2)     Status:  None   Collection Time    07/25/14  6:39 PM      Result Value Ref Range Status   Specimen Description BLOOD LEFT ARM   Final   Special Requests BOTTLES DRAWN AEROBIC AND ANAEROBIC 5CC   Final   Culture  Setup Time     Final   Value: 07/25/2014 22:39     Performed at [facility]   Culture     Final   Value:        BLOOD CULTURE RECEIVED NO GROWTH TO DATE CULTURE WILL BE HELD FOR 5 DAYS BEFORE ISSUING A FINAL NEGATIVE REPORT     Performed at [facility]   Report Status PENDING   Incomplete  CULTURE, BLOOD (ROUTINE X 2)     Status: None   Collection Time    07/25/14  6:39 PM      Result Value Ref Range Status   Specimen Description BLOOD RIGHT ARM   Final   Special Requests  BOTTLES DRAWN AEROBIC AND ANAEROBIC 5CC   Final   Culture  Setup Time     Final   Value: 07/25/2014 22:38     Performed at [facility]   Culture     Final   Value:        BLOOD CULTURE RECEIVED NO GROWTH TO DATE CULTURE WILL BE HELD FOR 5 DAYS BEFORE ISSUING A FINAL NEGATIVE REPORT     Performed at [facility]   Report Status PENDING   Incomplete  MRSA PCR SCREENING     Status: None   Collection Time    07/25/14 10:53 PM      Result Value Ref Range Status   MRSA by PCR NEGATIVE  NEGATIVE Final   Comment:            The GeneXpert MRSA Assay (FDA     approved for NASAL specimens     only), is one component of a     comprehensive MRSA colonization     surveillance program. It is not     intended to diagnose MRSA     infection nor to guide or     monitor treatment for     MRSA infections.  CLOSTRIDIUM DIFFICILE BY PCR     Status: None   Collection Time    07/27/14 11:19 AM      Result Value Ref Range Status   C difficile by pcr NEGATIVE  NEGATIVE Final   Comment: Performed at [facility]     Studies:              All Imaging reviewed and is as per above notation   Scheduled Meds: . ceFEPime (MAXIPIME) IV  1 g Intravenous 3 times per day  . demeclocycline  150 mg Oral BID  . digoxin  0.125 mg Oral Daily  . diltiazem  360 mg Oral Daily  . enoxaparin (LOVENOX) injection  40 mg Subcutaneous QHS  . feeding supplement (ENSURE COMPLETE)  237 mL Oral BID BM  . furosemide  40 mg Oral BID  . metoprolol tartrate  25 mg Oral BID  . pantoprazole  40 mg Oral Daily  . tamsulosin  0.4 mg Oral Daily  . thiamine  100 mg Oral Daily  . vancomycin  750 mg Intravenous BID   Continuous Infusions:    Assessment/Plan: 1. AFib with RVR       -rate controlled now       -continue Po  cardizem lower dose 300 daily [down from 360 on 8/23], also on Digoxin and q6 metoprolol change to BID metoprolol 25,       -await CB from Dr. Earlie Server regarding [facility]      -cards  following  2. Community acquired pneumonia continue vancomycin/cefepime.        -FU cultures, transtion to PO in am if stable       -Would transition to PO doxycycline for another 3 days eding 8/27  3. Pleural effusions     -moderate on R and small on left on Ct chest 8/21     -check BNP, ECHo with normal EF, was taking lasix at home PRN,     -transition to po lasix 40 daily on 8/23     -could be parapenumonic vs malignant effusion     -repeat CXR in am 8/24 shows no Pna, small effusion  3. Hypotension-secondary to sepsis, Meds etc     -transient resolved, stop IVF esp given effusions     -continue meds for Afib-they have been adjusted this am given hypotension     -long bedside discussion with family  4. Chronic Hyponatremia with superimposed acute compnenet- on Demclocycline 150 bid      -monitor with diuresis     -sodium 124-Cut back lasix 8/24     - regular diet  5. Gerd-continue Pantoprazole 40 daily  BPH-continue Flomax 0.4 daily.    Code Status: full Family Communication:  discussed with wife and family at bedsdie Disposition Plan: inpatient on tele-likely d/c in 1-2 days   Verneita Griffes, MD [facility] (P) [phone]    LOS: 3 days

## 2014-07-28 NOTE — Progress Notes (Signed)
Assumed care of patient at 1500. Patient resting in bed, no needs at this time. Agree with previous RN's assessment. Will continue to monitor.

## 2014-07-28 NOTE — Evaluation (Signed)
Physical Therapy Evaluation Patient Details Name: Anthony Curry MRN: [id number] DOB: [birth year] Today's Date: 07/28/2014   History of Present Illness  Anthony Curry is a 73 y.o. male  adm with HCAP, new onset afib, cardiology consulted and currently being managed well on meds; PMHx: HTN, lung CA with ongoing maintenance chemo, hx of heavy ETOH abuse;  Clinical Impression  Pt will benefit from PT to address deficits below; agreeable to HHPT;  pt is weaker and less steady than his baseline; Advised to continue to use cane, pt not amenable to using RW    Follow Up Recommendations Home health PT    Equipment Recommendations  None recommended by PT    Recommendations for Other Services       Precautions / Restrictions Precautions Precautions: Fall      Mobility  Bed Mobility Overal bed mobility: Modified Independent                Transfers Overall transfer level: Needs assistance Equipment used: Rolling walker (2 wheeled) Transfers: Sit to/from Stand Sit to Stand: Supervision            Ambulation/Gait Ambulation/Gait assistance: Min guard Ambulation Distance (Feet): 90 Feet Assistive device: None Gait Pattern/deviations: Drifts right/left;Decreased stride length     General Gait Details: decr trunk rotation, LOB x 2 with min/guard recovery  Stairs            Wheelchair Mobility    Modified Rankin (Stroke Patients Only)       Balance Overall balance assessment: Needs assistance Sitting-balance support: No upper extremity supported;Feet supported Sitting balance-Leahy Scale: Normal     Standing balance support: During functional activity;No upper extremity supported                 High level balance activites: Turns;Direction changes               Pertinent Vitals/Pain Pain Assessment: No/denies pain    Home Living Family/patient expects to be discharged to:: Private residence Living Arrangements: Spouse/significant  other Available Help at Discharge: Available 24 hours/day;Available PRN/intermittently Type of Home: House Home Access: Level entry     Home Layout: Able to live on main level with bedroom/bathroom;Two level Home Equipment: Cane - single point      Prior Function Level of Independence: Independent;Independent with assistive device(s)         Comments: used cane intermittently at home     Hand Dominance        Extremity/Trunk Assessment   Upper Extremity Assessment: Overall WFL for tasks assessed;Defer to OT evaluation           Lower Extremity Assessment: Generalized weakness         Communication   Communication: No difficulties  Cognition Arousal/Alertness: Awake/alert Behavior During Therapy: WFL for tasks assessed/performed Overall Cognitive Status: Within Functional Limits for tasks assessed                      General Comments      Exercises        Assessment/Plan    PT Assessment Patient needs continued PT services  PT Diagnosis Difficulty walking   PT Problem List Decreased activity tolerance;Decreased balance;Decreased mobility  PT Treatment Interventions DME instruction;Gait training;Functional mobility training;Therapeutic activities;Therapeutic exercise;Balance training   PT Goals (Current goals can be found in the Care Plan section) Acute Rehab PT Goals Patient Stated Goal: to get stronger PT Goal Formulation: With patient Time For Goal Achievement: 08/04/14 Potential to  Achieve Goals: Good    Frequency Min 3X/week   Barriers to discharge        Co-evaluation               End of Session   Activity Tolerance: Patient tolerated treatment well Patient left: in bed;with call bell/phone within reach;with family/visitor present Nurse Communication: Mobility status         Time: 1428-1450 PT Time Calculation (min): 22 min   Charges:   PT Evaluation $Initial PT Evaluation Tier I: 1 Procedure PT  Treatments $Gait Training: 8-22 mins   PT G Codes:          Lorna Strother August 04, 2014, 2:59 PM

## 2014-07-28 NOTE — Discharge Instructions (Signed)
Information on my medicine - ELIQUIS (apixaban)  This medication education was reviewed with me or my healthcare representative as part of my discharge preparation.  The pharmacist that spoke with me during my hospital stay was:  Julio Sicks [facility]  Why was Eliquis prescribed for you? Eliquis was prescribed for you to reduce the risk of a blood clot forming that can cause a stroke if you have a medical condition called atrial fibrillation (a type of irregular heartbeat).  What do You need to know about Eliquis ? Take your Eliquis TWICE DAILY - one tablet in the morning and one tablet in the evening with or without food. If you have difficulty swallowing the tablet whole please discuss with your pharmacist how to take the medication safely.  Take Eliquis exactly as prescribed by your doctor and DO NOT stop taking Eliquis without talking to the doctor who prescribed the medication.  Stopping may increase your risk of developing a stroke.  Refill your prescription before you run out.  After discharge, you should have regular check-up appointments with your healthcare provider that is prescribing your Eliquis.  In the future your dose may need to be changed if your kidney function or weight changes by a significant amount or as you get older.  What do you do if you miss a dose? If you miss a dose, take it as soon as you remember on the same day and resume taking twice daily.  Do not take more than one dose of ELIQUIS at the same time to make up a missed dose.  Important Safety Information A possible side effect of Eliquis is bleeding. You should call your healthcare provider right away if you experience any of the following:   Bleeding from an injury or your nose that does not stop.   Unusual colored urine (red or dark brown) or unusual colored stools (red or black).   Unusual bruising for unknown reasons.   A serious fall or if you hit your head (even if there is no bleeding).  Some  medicines may interact with Eliquis and might increase your risk of bleeding or clotting while on Eliquis. To help avoid this, consult your healthcare provider or pharmacist prior to using any new prescription or non-prescription medications, including herbals, vitamins, non-steroidal anti-inflammatory drugs (NSAIDs) and supplements.  This website has more information on Eliquis (apixaban): http://www.eliquis.com/eliquis/home

## 2014-07-28 NOTE — Progress Notes (Signed)
     SUBJECTIVE: Feels much better this am. Energy level is better. No SOB.   BP 99/53  Pulse 54  Temp(Src) 98 F (36.7 C) (Oral)  Resp 23  Ht 6' (1.829 m)  Wt 156 lb 12 oz (71.1 kg)  BMI 21.25 kg/m2  SpO2 94%  Intake/Output Summary (Last 24 hours) at 07/28/14 9702 Last data filed at 07/28/14 0208  Gross per 24 hour  Intake    857 ml  Output   2600 ml  Net  -1743 ml    PHYSICAL EXAM General: Well developed, well nourished, in no acute distress. Alert and oriented x 3.  Psych:  Good affect, responds appropriately Neck: No JVD. No masses noted.  Lungs: Clear bilaterally with no wheezes or rhonci noted.  Heart: Irreg irreg with no murmurs noted. Abdomen: Bowel sounds are present. Soft, non-tender.  Extremities: No lower extremity edema.   LABS: Basic Metabolic Panel:  Recent Labs  07/27/14 0347 07/28/14 0425  NA 128* 124*  K 4.0 3.7  CL 93* 87*  CO2 24 25  GLUCOSE 118* 105*  BUN 5* 6  CREATININE 0.59 0.61  CALCIUM 8.6 8.7  MG 1.5 1.3*   CBC:  Recent Labs  07/26/14 0613 07/27/14 0347  WBC 3.5* 5.8  NEUTROABS 2.0 3.9  HGB 8.8* 9.6*  HCT 23.9* 26.3*  MCV 95.6 96.7  PLT 136* 182   Cardiac Enzymes:  Recent Labs  07/26/14 0050 07/26/14 0613 07/26/14 1146  TROPONINI <0.30 <0.30 <0.30   Current Meds: . ceFEPime (MAXIPIME) IV  1 g Intravenous 3 times per day  . demeclocycline  150 mg Oral BID  . digoxin  0.125 mg Oral Daily  . diltiazem  360 mg Oral Daily  . enoxaparin (LOVENOX) injection  40 mg Subcutaneous QHS  . feeding supplement (ENSURE COMPLETE)  237 mL Oral BID BM  . furosemide  20 mg Intravenous Q12H  . metoprolol tartrate  25 mg Oral BID  . pantoprazole  40 mg Oral Daily  . tamsulosin  0.4 mg Oral Daily  . thiamine  100 mg Oral Daily  . vancomycin  750 mg Intravenous BID     ASSESSMENT AND PLAN:  1. Atrial fib: Rate controlled on oral diltiazem, lopressor, digoxin. Per notes of Dr. [name] (EP), will plan rate control for now.  Primary team to discuss safety of long term anti-coagulation with his treating cancer specialist. He could be started on Eliquis 5 mg po BID if there is a decision made to start anti-coagulation to limit long term risk of CVA. He will need follow up with Dr. [name] within 2 weeks post discharge.  From a cardiac standpoint, would be ok to d/c home later today.   MCALHANY,CHRISTOPHER  8/24/20156:55 AM

## 2014-07-29 LAB — BASIC METABOLIC PANEL
Anion gap: 9 (ref 5–15)
BUN: 8 mg/dL (ref 6–23)
CALCIUM: 8.6 mg/dL (ref 8.4–10.5)
CHLORIDE: 92 meq/L — AB (ref 96–112)
CO2: 28 meq/L (ref 19–32)
CREATININE: 0.63 mg/dL (ref 0.50–1.35)
GFR calc non Af Amer: >90 mL/min (ref >90)
Glucose, Bld: 101 mg/dL — ABNORMAL HIGH (ref 70–99)
Potassium: 4 mEq/L (ref 3.7–5.3)
SODIUM: 129 meq/L — AB (ref 137–147)

## 2014-07-29 MED ORDER — DOXYCYCLINE HYCLATE 100 MG PO TABS: 100.0000 mg | ORAL_TABLET | Freq: Two times a day (BID) | ORAL | Status: DC

## 2014-07-29 MED ORDER — METOPROLOL TARTRATE 25 MG PO TABS: 25.0000 mg | ORAL_TABLET | Freq: Two times a day (BID) | ORAL | Status: DC

## 2014-07-29 MED ORDER — APIXABAN 5 MG PO TABS: 5.0000 mg | ORAL_TABLET | Freq: Two times a day (BID) | ORAL | Status: DC

## 2014-07-29 MED ORDER — DIGOXIN 125 MCG PO TABS: 0.1250 mg | ORAL_TABLET | Freq: Every day | ORAL | Status: DC

## 2014-07-29 MED ORDER — DILTIAZEM HCL ER COATED BEADS 360 MG PO CP24: 360.0000 mg | ORAL_CAPSULE | Freq: Every day | ORAL | Status: DC

## 2014-07-29 MED ORDER — DILTIAZEM HCL ER COATED BEADS 300 MG PO CP24: 300.0000 mg | ORAL_CAPSULE | Freq: Every day | ORAL | Status: DC

## 2014-07-29 MED ORDER — DILTIAZEM HCL ER 60 MG PO CP12
60.0000 mg | ORAL_CAPSULE | Freq: Once | ORAL | Status: AC
  Administered 2014-07-29: 60 mg via ORAL
  Filled 2014-07-29: qty 1

## 2014-07-29 MED ORDER — FUROSEMIDE 40 MG PO TABS: 40.0000 mg | ORAL_TABLET | Freq: Every day | ORAL | Status: DC

## 2014-07-29 NOTE — Progress Notes (Signed)
     SUBJECTIVE: No complaints. Breathing is better.   BP 102/53  Pulse 61  Temp(Src) 98.1 F (36.7 C) (Oral)  Resp 20  Ht 6' (1.829 m)  Wt 156 lb 12 oz (71.1 kg)  BMI 21.25 kg/m2  SpO2 96%  Intake/Output Summary (Last 24 hours) at 07/29/14 7106 Last data filed at 07/28/14 1800  Gross per 24 hour  Intake    360 ml  Output    400 ml  Net    -40 ml    PHYSICAL EXAM General: Well developed, well nourished, in no acute distress. Alert and oriented x 3.  Psych:  Good affect, responds appropriately Neck: No JVD. No masses noted.  Lungs: Clear bilaterally with no wheezes or rhonci noted.  Heart: irreg irreg with no murmurs noted. Abdomen: Bowel sounds are present. Soft, non-tender.  Extremities: No lower extremity edema.   LABS: Basic Metabolic Panel:  Recent Labs  07/27/14 0347 07/28/14 0425 07/29/14 0441  NA 128* 124* 129*  K 4.0 3.7 4.0  CL 93* 87* 92*  CO2 24 25 28   GLUCOSE 118* 105* 101*  BUN 5* 6 8  CREATININE 0.59 0.61 0.63  CALCIUM 8.6 8.7 8.6  MG 1.5 1.3*  --    CBC:  Recent Labs  07/27/14 0347  WBC 5.8  NEUTROABS 3.9  HGB 9.6*  HCT 26.3*  MCV 96.7  PLT 182   Cardiac Enzymes:  Recent Labs  07/26/14 1146  TROPONINI <0.30   Current Meds: . apixaban  5 mg Oral BID  . demeclocycline  150 mg Oral BID  . digoxin  0.125 mg Oral Daily  . diltiazem  300 mg Oral Daily  . doxycycline  100 mg Oral Q12H  . feeding supplement (ENSURE COMPLETE)  237 mL Oral BID BM  . furosemide  40 mg Oral Daily  . metoprolol tartrate  25 mg Oral BID  . pantoprazole  40 mg Oral Daily  . tamsulosin  0.4 mg Oral Daily  . thiamine  100 mg Oral Daily    ASSESSMENT AND PLAN:   1. Atrial fib: Rate controlled over last 24 hours on current regimen of oral diltiazem, lopressor, digoxin. Rates this am 100-110. If he has higher rates with ambulation, may need to increase Cardizem back to 360 mg daily. He has been started on Eliquis 5 mg po BID for anti-coagulation. He will  need follow up with Dr. [name] within 2 weeks post discharge. From a cardiac standpoint, he is stable and could be discharged home.    Anthony Curry  8/25/20156:34 AM

## 2014-07-29 NOTE — Discharge Summary (Addendum)
Physician Discharge Summary  Anthony Curry XHB:[id number] DOB: [birth year] DOA: 07/25/2014  PCP: [name], MD  Admit date: 07/25/2014 Discharge date: 07/29/2014  Time spent: 55 minutes  Recommendations for Outpatient Follow-up:  1. Patient will need outpatient modification of Rate controlling agents as atrial fibrillation rate only moderately controlled. This may be limited by his hypotension and will need to be managed by Dr. [name] 2. Recommend complete metabolic panel as well as CBC in about a week 3. Recommend blood pressure checks daily especially if he becomes dizzy 4. Recommend continuation of elliquis as an outpatient  5. Recommend a regular diet and not to salt controlled a day given his chronic hyponatremia    Discharge Diagnoses:  Principal Problem:   Hypotension Active Problems:   Hyponatremia   Anemia of chronic disease   BPH (benign prostatic hyperplasia)   Pulmonary nodules   Adrenal mass   Lung cancer   HCAP (healthcare-associated pneumonia)   Alcoholism in remission   Atrial flutter by electrocardiogram   Atrial fibrillation with RVR   Discharge Condition:  good   Diet recommendation:  regular   Filed Weights   07/26/14 0400 07/27/14 0424 07/27/14 2024  Weight: 70.2 kg (154 lb 12.2 oz) 71.4 kg (157 lb 6.5 oz) 71.1 kg (156 lb 12 oz)    History of present illness:   73 y/o ?, known ho Stage IV (T1 A., N0, M1 B.) non-small cell lung cancer, prior heavy ETOH use, HTn, BPH, Gerd, prior hyponatremnia thought to be from ETOH use admitted [facility] with CAP and hypotension and concern for sepsis.  Found to have new onset rapid atrial fibrillation and medications were adjusted and initially and then cardiology was consulted  Transferred out of the unit 8/23  Found to have persisting A. fib still and cardiology consulted for help in management Some of his rate control is limited by hypotension and will need to be monitored as an outpatient   Hospital  Course:  Assessment/Plan:  1. AFib with RVR -rate controlled now  -continue Po cardizem 360 mg daily, also on Digoxin and q6 metoprolol change to BID metoprolol 25,  -ok from Oncology perspective for Eliquis 5 bid-explained the risks of stroke vs. bleeding and recommended that outpatient management be done -cards follow the patient in the hospital 2. Community acquired pneumonia continue vancomycin/cefepime.  -FU cultures, transtion to PO in am if stable  -Would transition to PO doxycycline for another 3 days eding 8/27  3. Pleural effusions  -moderate on R and small on left on Ct chest 8/21  -check BNP, ECHo with normal EF, was taking lasix at home PRN,  -transition to po lasix 40 daily on 8/23  -could be parapenumonic vs malignant effusion  -repeat CXR in am 8/24 shows no Pna, small effusion  3. Hypotension-secondary to sepsis, Meds etc -transient resolved, stop IVF esp given effusions  -continue meds for Afib-they have been adjusted this am given hypotension  -long bedside discussion with family-have recommended regular salt in diet as well as fluid and discussed the need for the medications which are rate controlling only fairly as we are limited as ishronic Hyponatremia with superimposed acute compnenet- on Demclocycline 150 bid       4. Hyponatremia  -monitor with diuresis  -sodium 124-Cut back lasix 8/24  to by mouth Lasix daily  - regular diet  5. Gerd-continue Pantoprazole 40 daily   Consultants:   Cardiology  Procedures:  XCXR   CT scan chest   Echo  preserved EF  Antibiotics:  Cefepime, demeclocyclin, Levaquin, vancomycin 8/21  Vancomycin 8/21-8/24  Cefepime 8/21-8/24  Doxycycline 8/24-8/26  Discharge Exam: Filed Vitals:   07/29/14 1006  BP: 123/51  Pulse: 124  Temp:   Resp:     doing well no specific issues   General:  alert wasn't oriented  Cardiovascular:  S1-S2 no murmur rub or gallop irregular rate and rhythm  Respiratory:  clinically clear    Discharge Instructions You were cared for by a hospitalist during your hospital stay. If you have any questions about your discharge medications or the care you received while you were in the hospital after you are discharged, you can call the unit and asked to speak with the hospitalist on call if the hospitalist that took care of you is not available. Once you are discharged, your primary care physician will handle any further medical issues. Please note that NO REFILLS for any discharge medications will be authorized once you are discharged, as it is imperative that you return to your primary care physician (or establish a relationship with a primary care physician if you do not have one) for your aftercare needs so that they can reassess your need for medications and monitor your lab values.  Discharge Instructions   Diet - low sodium heart healthy    Complete by:  As directed      Discharge instructions    Complete by:  As directed   Your medication list is changed as you will notice I would recommend that you take your digoxin and metoprolol early in the morning and take her Cardizem at around 12 PM or 1 PM to minimize chances of having low blood pressures. I would also recommend that you continue your blood thinner which is relatively safe and will prevent you from having a stroke from atrial fibrillation. It is a good idea to check her blood sugar every morning and if he feels dizzy it would be a good idea to check her blood sugars well. I would recommend primary care physician or cardiologist check some labs in the next week or so We will ask therapy to help at home to make sure your able to balance and do well with regular activities It is ideal that you take a low-salt diet because of a history hypertension however you have completed factors upper lobe salt level as we discussed which is probably from your lung cancer. Therefore I would just eat a regular diet without too much added salt  and drink regularly don't drink in excess [ In other words drink when you are thirsty] Please report any bleeding to primary care physician or cardiologist and if you have repeated episodes of bleeding, recommend that you get medical attention more urgently     Increase activity slowly    Complete by:  As directed             Medication List    STOP taking these medications       amLODipine 10 MG tablet  Commonly known as:  NORVASC     amoxicillin-clavulanate 875-125 MG per tablet  Commonly known as:  AUGMENTIN     atenolol 100 MG tablet  Commonly known as:  TENORMIN     folic acid 1 MG tablet  Commonly known as:  FOLVITE     olmesartan 40 MG tablet  Commonly known as:  BENICAR     prochlorperazine 10 MG tablet  Commonly known as:  COMPAZINE  TAKE these medications       ACIDOPHILUS PO  Take 1 each by mouth.     ALPRAZolam 0.25 MG tablet  Commonly known as:  XANAX  Take 0.25 mg by mouth 3 (three) times daily as needed for anxiety or sleep.     apixaban 5 MG Tabs tablet  Commonly known as:  ELIQUIS  Take 1 tablet (5 mg total) by mouth 2 (two) times daily.     Biotin 7500 MCG Tabs  Take 1 tablet by mouth.     calcium carbonate 600 MG Tabs tablet  Commonly known as:  OS-CAL  Take 600 mg by mouth daily with breakfast.     demeclocycline 150 MG tablet  Commonly known as:  DECLOMYCIN  Take 150 mg by mouth 2 (two) times daily.     dexamethasone 4 MG tablet  Commonly known as:  DECADRON  Take 4 mg by mouth See admin instructions. Take one tablet by mouth twice daily the day before, the day of, and the day after chemotherapy every three weeks.     dexamethasone 4 MG tablet  Commonly known as:  DECADRON  TAKE 1 TABLET BY MOUTH TWICE A DAY THE DAY BEFORE, DAY OF AND DAY AFTER CHEMOTHERAPY EVERY 3 WEEKS     digoxin 0.125 MG tablet  Commonly known as:  LANOXIN  Take 1 tablet (0.125 mg total) by mouth daily.     diltiazem 360 MG 24 hr capsule  Commonly known  as:  CARDIZEM CD  Take 1 capsule (360 mg total) by mouth daily at 12 noon.     doxycycline 100 MG tablet  Commonly known as:  VIBRA-TABS  Take 1 tablet (100 mg total) by mouth every 12 (twelve) hours.     feeding supplement (ENSURE COMPLETE) Liqd  Take 237 mLs by mouth daily.     FISH OIL PO  Take 1 tablet by mouth daily.     furosemide 40 MG tablet  Commonly known as:  LASIX  Take 1 tablet (40 mg total) by mouth daily.     HYDROcodone-acetaminophen 5-325 MG per tablet  Commonly known as:  NORCO/VICODIN  Take 1-2 tablets by mouth every 4 (four) hours as needed.     MAGNESIUM PO  Take 400 mg by mouth daily.     metoprolol tartrate 25 MG tablet  Commonly known as:  LOPRESSOR  Take 1 tablet (25 mg total) by mouth 2 (two) times daily.     multivitamin with minerals Tabs tablet  Take 1 tablet by mouth daily.     omeprazole 20 MG capsule  Commonly known as:  PRILOSEC  Take 20 mg by mouth daily.     OVER THE COUNTER MEDICATION  Inject 1 Units into the vein daily.     Potassium 99 MG Tabs  Take 1 tablet by mouth daily.     SUPER B COMPLEX PO  Take 1 each by mouth.     tamsulosin 0.4 MG Caps capsule  Commonly known as:  FLOMAX  Take 0.4 mg by mouth 2 (two) times daily.     thiamine 100 MG tablet  Commonly known as:  VITAMIN B-1  Take 1 tablet (100 mg total) by mouth daily.     VITAMIN C PO  Take 1,000 mg by mouth daily.     Vitamin D-3 5000 UNITS Tabs  Take 5,000 Units by mouth daily.     vitamin E 400 UNIT capsule  Take 400 Units by mouth daily.  No Known Allergies    The results of significant diagnostics from this hospitalization (including imaging, microbiology, ancillary and laboratory) are listed below for reference.    Significant Diagnostic Studies: Dg Chest 2 View  07/28/2014   CLINICAL DATA:  Pleural effusion.  EXAM: CHEST  2 VIEW  COMPARISON:  CT 07/25/2014 .  FINDINGS: Mediastinum and hilar structures are normal. Heart size normal.  Pulmonary vasculature is normal. Right base atelectasis and/or infiltrates again noted. Small right pleural effusion noted. Old right rib fractures. No pneumothorax.  Reference is made to prior chest CT report for discussion of left apical spiculated nodule. This is not well identified by this chest x-ray. Postsurgical changes left shoulder. Old posttraumatic deformity right shoulder.  IMPRESSION: 1. Previously identified left apical spiculated nodule not well identified by this chest x-ray. Reference is made to chest CT report of 08/04/2014. 2. Right base atelectasis and/or infiltrate. Small right pleural effusion .   Electronically Signed   By: Marcello Moores  Register   On: 07/28/2014 09:03   Dg Chest 2 View  07/25/2014   CLINICAL DATA:  Productive cough, lung cancer  EXAM: CHEST  2 VIEW  COMPARISON:  06/02/2014  FINDINGS: Significant degenerative changes right shoulder again noted. Multiple old right rib fractures. No pulmonary edema. There is small right pleural effusion with right base posterior atelectasis or infiltrate. No pulmonary edema. A small nodule in left apex is stable from prior exam.  IMPRESSION: Small right pleural effusion with right base posterior atelectasis or infiltrate. No pulmonary edema. Multiple old right rib fractures. Degenerative changes right shoulder.   Electronically Signed   By: Lahoma Crocker M.D.   On: 07/25/2014 18:13   Ct Angio Chest Pe W/cm &/or Wo Cm  07/25/2014   CLINICAL DATA:  73 year old male with increasing shortness of breath. Initial encounter. Lung cancer.  EXAM: CT ANGIOGRAPHY CHEST WITH CONTRAST  TECHNIQUE: Multidetector CT imaging of the chest was performed using the standard protocol during bolus administration of intravenous contrast. Multiplanar CT image reconstructions and MIPs were obtained to evaluate the vascular anatomy.  CONTRAST:  178mL OMNIPAQUE IOHEXOL 350 MG/ML SOLN  COMPARISON:  Chest CT 06/02/2014 and earlier.  FINDINGS: Good contrast bolus timing in the  pulmonary arterial tree. Respiratory motion artifact at the lung bases. No focal filling defect identified in the pulmonary arterial tree to suggest the presence of acute pulmonary embolism.Intermittent retained secretions mostly along the anterior trachea (series 11, image 28). Increased layering right pleural effusion, now moderate. Trace layering left pleural effusion is new.  Spiculated left apical lung nodule is mildly increased measuring 22 x 12 mm on axial images (20 x 9 mm previously). Apical paraseptal emphysema. Increased pulmonary ground-glass opacity, mostly peri-bronchovascular in nature, with continued more focal areas of irregular opacity as documented in June. Increased lower lobe atelectasis. Bilateral peribronchial thickening. No consolidation.  Evidence of a small pericardial effusion along the posterior heart border, probably inconsequential but appears increased. Calcified atherosclerosis of the aorta. Patent visualized major aortic branches. Stable thoracic inlet. No mediastinal or hilar lymphadenopathy.  Stable visualized liver parenchyma. Cholelithiasis. Stable visualized spleen and pancreas. Stable right adrenal gland and visualized kidneys. Left adrenal mass is stable measuring 24 mm diameter. Grossly negative visible bowel in the upper abdomen.  Advanced degenerative changes in the spine. Multiple bilateral chronic rib fractures, some ununited. Advanced probable degenerative osseous changes at the right glenohumeral joint. Sequelae of left glenohumeral arthroplasty. No acute or suspicious osseous lesion identified.  Review of the MIP images  confirms the above findings.  IMPRESSION: 1.  No evidence of acute pulmonary embolus. 2. Increased layering pleural effusions, moderate on the right and small on the left. 3. Increased patchy bilateral pulmonary ground-glass opacity suspicious for acute infectious exacerbation superimposed on chronic lung disease. 4. Stable to mildly increased  spiculated left apical lung nodule. Stable left adrenal metastasis.   Electronically Signed   By: Lars Pinks M.D.   On: 07/25/2014 19:16    Microbiology: Recent Results (from the past 240 hour(s))  CULTURE, BLOOD (ROUTINE X 2)     Status: None   Collection Time    07/25/14  6:39 PM      Result Value Ref Range Status   Specimen Description BLOOD LEFT ARM   Final   Special Requests BOTTLES DRAWN AEROBIC AND ANAEROBIC 5CC   Final   Culture  Setup Time     Final   Value: 07/25/2014 22:39     Performed at [facility]   Culture     Final   Value:        BLOOD CULTURE RECEIVED NO GROWTH TO DATE CULTURE WILL BE HELD FOR 5 DAYS BEFORE ISSUING A FINAL NEGATIVE REPORT     Performed at [facility]   Report Status PENDING   Incomplete  CULTURE, BLOOD (ROUTINE X 2)     Status: None   Collection Time    07/25/14  6:39 PM      Result Value Ref Range Status   Specimen Description BLOOD RIGHT ARM   Final   Special Requests BOTTLES DRAWN AEROBIC AND ANAEROBIC 5CC   Final   Culture  Setup Time     Final   Value: 07/25/2014 22:38     Performed at [facility]   Culture     Final   Value:        BLOOD CULTURE RECEIVED NO GROWTH TO DATE CULTURE WILL BE HELD FOR 5 DAYS BEFORE ISSUING A FINAL NEGATIVE REPORT     Performed at [facility]   Report Status PENDING   Incomplete  MRSA PCR SCREENING     Status: None   Collection Time    07/25/14 10:53 PM      Result Value Ref Range Status   MRSA by PCR NEGATIVE  NEGATIVE Final   Comment:            The GeneXpert MRSA Assay (FDA     approved for NASAL specimens     only), is one component of a     comprehensive MRSA colonization     surveillance program. It is not     intended to diagnose MRSA     infection nor to guide or     monitor treatment for     MRSA infections.  CLOSTRIDIUM DIFFICILE BY PCR     Status: None   Collection Time    07/27/14 11:19 AM      Result Value Ref Range Status   C difficile by pcr  NEGATIVE  NEGATIVE Final   Comment: Performed at [place]: Basic Metabolic Panel:  Recent Labs Lab 07/25/14 1732 07/26/14 0613 07/27/14 0347 07/28/14 0425 07/29/14 0441  NA 121* 131* 128* 124* 129*  K 4.2 3.7 4.0 3.7 4.0  CL 84* 98 93* 87* 92*  CO2 25 22 24 25 28   GLUCOSE 109* 94 118* 105* 101*  BUN 10 7 5* 6 8  CREATININE 0.70 0.56 0.59  0.61 0.63  CALCIUM 9.1 8.1* 8.6 8.7 8.6  MG  --   --  1.5 1.3*  --    Liver Function Tests:  Recent Labs Lab 07/25/14 1732 07/26/14 0613  AST 30 21  ALT 20 15  ALKPHOS 108 80  BILITOT 0.6 0.4  PROT 6.5 5.1*  ALBUMIN 3.0* 2.3*   No results found for this basename: LIPASE, AMYLASE,  in the last 168 hours No results found for this basename: AMMONIA,  in the last 168 hours CBC:  Recent Labs Lab 07/25/14 1732 07/26/14 0613 07/27/14 0347  WBC 4.8 3.5* 5.8  NEUTROABS 2.9 2.0 3.9  HGB 10.1* 8.8* 9.6*  HCT 27.8* 23.9* 26.3*  MCV 95.2 95.6 96.7  PLT 209 136* 182   Cardiac Enzymes:  Recent Labs Lab 07/26/14 0050 07/26/14 0613 07/26/14 1146  TROPONINI <0.30 <0.30 <0.30   BNP: BNP (last 3 results)  Recent Labs  07/27/14 0347  PROBNP 1683.0*   CBG: No results found for this basename: GLUCAP,  in the last 168 hours     Signed:  Nita Sells  [facility] 07/29/2014, 10:54 AM

## 2014-07-29 NOTE — Progress Notes (Signed)
Pt's HR went to 160's while ambulating with PT. Pt asymptomatic with no SOB or chest pain. BP was 123/52. HR came down to 110s with rest. Cardiology and Hospitalist made aware as pt had written discharge orders. Cardiology stated to increase cardizem dose to 360mg  and to ambulate pt again this afternoon. If HR is 110's ok to discharge today, but if up to 160's again keep pt to monitor overnight. This information was relayed to Hospitalist and he made changes to the cardizem dose and stated to keep pt for now and reassess orthostatic vital signs and ambulating HR in about 4 hours. Will relay this information to bedside RN and continue to follow.   Othella Boyer [facility] 07/29/2014 11:33 AM

## 2014-07-31 LAB — CULTURE, BLOOD (ROUTINE X 2)
CULTURE: NO GROWTH
Culture: NO GROWTH

## 2014-08-01 ENCOUNTER — Ambulatory Visit (HOSPITAL_COMMUNITY): Payer: Medicare Other

## 2014-08-06 ENCOUNTER — Telehealth: Payer: Self-pay | Admitting: Internal Medicine

## 2014-08-06 ENCOUNTER — Encounter: Payer: Self-pay | Admitting: Internal Medicine

## 2014-08-06 ENCOUNTER — Ambulatory Visit (HOSPITAL_BASED_OUTPATIENT_CLINIC_OR_DEPARTMENT_OTHER): Payer: Medicare Other

## 2014-08-06 ENCOUNTER — Other Ambulatory Visit (HOSPITAL_BASED_OUTPATIENT_CLINIC_OR_DEPARTMENT_OTHER): Payer: Medicare Other

## 2014-08-06 ENCOUNTER — Ambulatory Visit (HOSPITAL_BASED_OUTPATIENT_CLINIC_OR_DEPARTMENT_OTHER): Payer: Medicare Other | Admitting: Internal Medicine

## 2014-08-06 VITALS — BP 108/50 | HR 55 | Temp 97.7°F | Resp 19 | Ht 72.0 in | Wt 155.9 lb

## 2014-08-06 DIAGNOSIS — Z23 Encounter for immunization: Secondary | ICD-10-CM

## 2014-08-06 DIAGNOSIS — C3431 Malignant neoplasm of lower lobe, right bronchus or lung: Secondary | ICD-10-CM

## 2014-08-06 DIAGNOSIS — C3490 Malignant neoplasm of unspecified part of unspecified bronchus or lung: Secondary | ICD-10-CM

## 2014-08-06 DIAGNOSIS — Z5111 Encounter for antineoplastic chemotherapy: Secondary | ICD-10-CM | POA: Diagnosis not present

## 2014-08-06 DIAGNOSIS — C7970 Secondary malignant neoplasm of unspecified adrenal gland: Secondary | ICD-10-CM

## 2014-08-06 DIAGNOSIS — J90 Pleural effusion, not elsewhere classified: Secondary | ICD-10-CM

## 2014-08-06 DIAGNOSIS — C3430 Malignant neoplasm of lower lobe, unspecified bronchus or lung: Secondary | ICD-10-CM

## 2014-08-06 LAB — CBC WITH DIFFERENTIAL/PLATELET
BASO%: 0.7 % (ref 0.0–2.0)
Basophils Absolute: 0 10*3/uL (ref 0.0–0.1)
EOS%: 5.7 % (ref 0.0–7.0)
Eosinophils Absolute: 0.2 10*3/uL (ref 0.0–0.5)
HEMATOCRIT: 30.9 % — AB (ref 38.4–49.9)
HGB: 10.8 g/dL — ABNORMAL LOW (ref 13.0–17.1)
LYMPH#: 1.3 10*3/uL (ref 0.9–3.3)
LYMPH%: 30.9 % (ref 14.0–49.0)
MCH: 34.8 pg — ABNORMAL HIGH (ref 27.2–33.4)
MCHC: 35 g/dL (ref 32.0–36.0)
MCV: 99.7 fL — ABNORMAL HIGH (ref 79.3–98.0)
MONO#: 0.8 10*3/uL (ref 0.1–0.9)
MONO%: 19 % — ABNORMAL HIGH (ref 0.0–14.0)
NEUT#: 1.8 10*3/uL (ref 1.5–6.5)
NEUT%: 43.7 % (ref 39.0–75.0)
NRBC: 0 % (ref 0–0)
Platelets: 432 10*3/uL — ABNORMAL HIGH (ref 140–400)
RBC: 3.1 10*6/uL — AB (ref 4.20–5.82)
RDW: 16.4 % — ABNORMAL HIGH (ref 11.0–14.6)
WBC: 4.1 10*3/uL (ref 4.0–10.3)

## 2014-08-06 LAB — COMPREHENSIVE METABOLIC PANEL (CC13)
ALK PHOS: 120 U/L (ref 40–150)
ALT: 21 U/L (ref 0–55)
AST: 23 U/L (ref 5–34)
Albumin: 3.1 g/dL — ABNORMAL LOW (ref 3.5–5.0)
Anion Gap: 11 mEq/L (ref 3–11)
BUN: 7.8 mg/dL (ref 7.0–26.0)
CALCIUM: 9.2 mg/dL (ref 8.4–10.4)
CHLORIDE: 97 meq/L — AB (ref 98–109)
CO2: 25 mEq/L (ref 22–29)
Creatinine: 0.7 mg/dL (ref 0.7–1.3)
Glucose: 102 mg/dl (ref 70–140)
POTASSIUM: 3.7 meq/L (ref 3.5–5.1)
SODIUM: 133 meq/L — AB (ref 136–145)
TOTAL PROTEIN: 6 g/dL — AB (ref 6.4–8.3)
Total Bilirubin: 0.39 mg/dL (ref 0.20–1.20)

## 2014-08-06 MED ORDER — ONDANSETRON 8 MG/50ML IVPB (CHCC)
8.0000 mg | Freq: Once | INTRAVENOUS | Status: AC
  Administered 2014-08-06: 8 mg via INTRAVENOUS

## 2014-08-06 MED ORDER — DEXAMETHASONE SODIUM PHOSPHATE 10 MG/ML IJ SOLN
INTRAMUSCULAR | Status: AC
  Filled 2014-08-06: qty 1

## 2014-08-06 MED ORDER — INFLUENZA VAC SPLIT QUAD 0.5 ML IM SUSY
0.5000 mL | PREFILLED_SYRINGE | Freq: Once | INTRAMUSCULAR | Status: AC
  Administered 2014-08-06: 0.5 mL via INTRAMUSCULAR
  Filled 2014-08-06: qty 0.5

## 2014-08-06 MED ORDER — SODIUM CHLORIDE 0.9 % IV SOLN
Freq: Once | INTRAVENOUS | Status: AC
  Administered 2014-08-06: 11:00:00 via INTRAVENOUS

## 2014-08-06 MED ORDER — DEXAMETHASONE SODIUM PHOSPHATE 10 MG/ML IJ SOLN
10.0000 mg | Freq: Once | INTRAMUSCULAR | Status: AC
Start: 2014-08-06 — End: 2014-08-06
  Administered 2014-08-06: 10 mg via INTRAVENOUS

## 2014-08-06 MED ORDER — ONDANSETRON 8 MG/NS 50 ML IVPB
INTRAVENOUS | Status: AC
  Filled 2014-08-06: qty 8

## 2014-08-06 MED ORDER — SODIUM CHLORIDE 0.9 % IV SOLN
500.0000 mg/m2 | Freq: Once | INTRAVENOUS | Status: AC
  Administered 2014-08-06: 975 mg via INTRAVENOUS
  Filled 2014-08-06: qty 39

## 2014-08-06 NOTE — Patient Instructions (Signed)
[place] Discharge Instructions for Patients Receiving Chemotherapy  Today you received the following chemotherapy agents: Alimta  To help prevent nausea and vomiting after your treatment, we encourage you to take your nausea medication: as directed.   If you develop nausea and vomiting that is not controlled by your nausea medication, call the clinic.   BELOW ARE SYMPTOMS THAT SHOULD BE REPORTED IMMEDIATELY:  *FEVER GREATER THAN 100.5 F  *CHILLS WITH OR WITHOUT FEVER  NAUSEA AND VOMITING THAT IS NOT CONTROLLED WITH YOUR NAUSEA MEDICATION  *UNUSUAL SHORTNESS OF BREATH  *UNUSUAL BRUISING OR BLEEDING  TENDERNESS IN MOUTH AND THROAT WITH OR WITHOUT PRESENCE OF ULCERS  *URINARY PROBLEMS  *BOWEL PROBLEMS  UNUSUAL RASH Items with * indicate a potential emergency and should be followed up as soon as possible.  Feel free to call the clinic you have any questions or concerns. The clinic phone number is [phone].

## 2014-08-06 NOTE — Progress Notes (Signed)
[place] [phone]   Fax:([phone]) [place], [place] [address]  DIAGNOSIS: Stage IV (T1 A., N0, M1 B.) non-small cell lung cancer, adenocarcinoma with negative EGFR mutation and negative ALK gene translocation diagnosed in October of 2014.   Molecular profile: Negative for RET, ALK, BRAF, MET, EGFR.  Positive for ERBB2 A775T, VZC5Y850Y, KRAS G13E, MAP2K1 D67N (see full report).   PRIOR THERAPY: Systemic chemotherapy with carboplatin for AUC of 5 and Alimta 500 mg/M2 every 3 weeks, status post 6 cycles with stable disease. First dose on 10/23/2013.[name]   CURRENT THERAPY: Maintenance systemic chemotherapy with single agent Alimta 500 mg/M2 every 3 weeks. Status post 6 cycles.  CHEMOTHERAPY INTENT: Palliative  CURRENT # OF CHEMOTHERAPY CYCLES: 7 CURRENT ANTIEMETICS: Zofran, Compazine and dexamethasone  CURRENT SMOKING STATUS: Former smoker  ORAL CHEMOTHERAPY AND CONSENT: None  CURRENT BISPHOSPHONATES USE: None  PAIN MANAGEMENT: 0/10  NARCOTICS INDUCED CONSTIPATION: None  LIVING WILL AND CODE STATUS: ?   INTERVAL HISTORY: Anthony Curry 73 y.o. male returns to the clinic today for followup visit accompanied by his wife and daughter. The patient is feeling fine today with no specific complaints except for fatigue. He was recently admitted to [facility] with shortness of breath and was found to have atrial fibrillation with rapid ventricular rate. He was started on treatment with by mouth Cardizem in addition to digoxin and metoprolol. He was also started on Eliquis. He is feeling much better after starting the medication for the atrial fibrillation. During his hospitalization he was also treated for community-acquired pneumonia with vancomycin and 17 transition to oral doxycycline for 3 more days. He denied having any significant chest pain, shortness of breath, cough or  hemoptysis. He has no significant weight loss or night sweats. He tolerated the last cycle of her systemic chemotherapy fairly well. He had repeat CT angiogram scan of the chest. During his hospitalization and he is here today for evaluation and discussion of his scan results.  MEDICAL HISTORY: Past Medical History  Diagnosis Date  . Hypertension   . GERD (gastroesophageal reflux disease)   . Alcohol abuse   . 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 Low sodium   . Cancer     small cell/lung  . Persistent atrial fibrillation 04/11/14    chads2vasc score is 2    ALLERGIES:  has No Known Allergies.  MEDICATIONS:  Current Outpatient Prescriptions  Medication Sig Dispense Refill  . ALPRAZolam (XANAX) 0.25 MG tablet Take 0.25 mg by mouth 3 (three) times daily as needed for anxiety or sleep.      [name] apixaban (ELIQUIS) 5 MG TABS tablet Take 1 tablet (5 mg total) by mouth 2 (two) times daily.  60 tablet  0  . Ascorbic Acid (VITAMIN C PO) Take 1,000 mg by mouth daily.       . B Complex-C (SUPER B COMPLEX PO) Take 1 each by mouth.      . Biotin 7500 MCG TABS Take 1 tablet by mouth.      . calcium carbonate (OS-CAL) 600 MG TABS Take 600 mg by mouth daily with breakfast.      . Cholecalciferol (VITAMIN D-3) 5000 UNITS TABS Take 5,000 Units by mouth daily.       [name] demeclocycline (DECLOMYCIN) 150 MG tablet Take 150 mg by mouth 2 (two) times daily.      [name] dexamethasone (DECADRON)  4 MG tablet TAKE 1 TABLET BY MOUTH TWICE A DAY THE DAY BEFORE, DAY OF AND DAY AFTER CHEMOTHERAPY EVERY 3 WEEKS  40 tablet  0  . dexamethasone (DECADRON) 4 MG tablet Take 4 mg by mouth See admin instructions. Take one tablet by mouth twice daily the day before, the day of, and the day after chemotherapy every three weeks.      . digoxin (LANOXIN) 0.125 MG tablet Take 1 tablet (0.125 mg total) by mouth daily.  30 tablet  0  . diltiazem (CARDIZEM CD) 360 MG 24 hr capsule Take 1 capsule (360 mg total) by mouth daily at 12 noon.   30 capsule  0  . doxycycline (VIBRA-TABS) 100 MG tablet Take 1 tablet (100 mg total) by mouth every 12 (twelve) hours.  4 tablet  0  . feeding supplement (ENSURE COMPLETE) LIQD Take 237 mLs by mouth daily.  30 Bottle  0  . furosemide (LASIX) 40 MG tablet Take 1 tablet (40 mg total) by mouth daily.  30 tablet  0  . HYDROcodone-acetaminophen (NORCO/VICODIN) 5-325 MG per tablet Take 1-2 tablets by mouth every 4 (four) hours as needed.  30 tablet  0  . Lactobacillus (ACIDOPHILUS PO) Take 1 each by mouth.      [name] MAGNESIUM PO Take 400 mg by mouth daily.       . metoprolol tartrate (LOPRESSOR) 25 MG tablet Take 1 tablet (25 mg total) by mouth 2 (two) times daily.  60 tablet  0  . Multiple Vitamin (MULTIVITAMIN WITH MINERALS) TABS Take 1 tablet by mouth daily.      . Omega-3 Fatty Acids (FISH OIL PO) Take 1 tablet by mouth daily.      [name] omeprazole (PRILOSEC) 20 MG capsule Take 20 mg by mouth daily.       [name] OVER THE COUNTER MEDICATION Inject 1 Units into the vein daily.      . Potassium 99 MG TABS Take 1 tablet by mouth daily.      . tamsulosin (FLOMAX) 0.4 MG CAPS capsule Take 0.4 mg by mouth 2 (two) times daily.      [name] thiamine (VITAMIN B-1) 100 MG tablet Take 1 tablet (100 mg total) by mouth daily.  30 tablet  6  . vitamin E 400 UNIT capsule Take 400 Units by mouth daily.       No current facility-administered medications for this visit.    SURGICAL HISTORY:  Past Surgical History  Procedure Laterality Date  . Hemorrhoid surgery    . Ankle arthrodesis  1995    rt fx-hardware in  . Tonsillectomy    . Colonoscopy    . Excision / curettage bone cyst finger  2005    rt thumb  . Total shoulder arthroplasty  02/03/2012    Procedure: TOTAL SHOULDER ARTHROPLASTY;  Surgeon: [name], MD;  Location: [facility];  Service: Orthopedics;  Laterality: Left;    REVIEW OF SYSTEMS:  Constitutional: positive for fatigue Eyes: negative Ears, nose, mouth, throat, and face:  negative Respiratory: negative Cardiovascular: negative Gastrointestinal: negative Genitourinary:negative Integument/breast: negative Hematologic/lymphatic: negative Musculoskeletal:negative Neurological: negative Behavioral/Psych: negative Endocrine: negative Allergic/Immunologic: negative   PHYSICAL EXAMINATION: General appearance: alert, cooperative, fatigued and no distress Head: Normocephalic, without obvious abnormality, atraumatic Neck: no adenopathy, no JVD, supple, symmetrical, trachea midline and thyroid not enlarged, symmetric, no tenderness/mass/nodules Lymph nodes: Cervical, supraclavicular, and axillary nodes normal. Resp: clear to auscultation bilaterally Back: symmetric, no curvature. ROM normal. No CVA tenderness. Cardio:  regular rate and rhythm, S1, S2 normal, no murmur, click, rub or gallop GI: soft, non-tender; bowel sounds normal; no masses,  no organomegaly Extremities: extremities normal, atraumatic, no cyanosis or edema Neurologic: Alert and oriented X 3, normal strength and tone. Normal symmetric reflexes. Normal coordination and gait  ECOG PERFORMANCE STATUS: 1 - Symptomatic but completely ambulatory  Blood pressure 108/50, pulse 55, temperature 97.7 F (36.5 C), temperature source Oral, resp. rate 19, height 6' (1.829 m), weight 155 lb 14.4 oz (70.716 kg).  LABORATORY DATA: Lab Results  Component Value Date   WBC 4.1 08/06/2014   HGB 10.8* 08/06/2014   HCT 30.9* 08/06/2014   MCV 99.7* 08/06/2014   PLT 432* 08/06/2014      Chemistry      Component Value Date/Time   NA 133* 08/06/2014 0830   NA 129* 07/29/2014 0441   K 3.7 08/06/2014 0830   K 4.0 07/29/2014 0441   CL 92* 07/29/2014 0441   CO2 25 08/06/2014 0830   CO2 28 07/29/2014 0441   BUN 7.8 08/06/2014 0830   BUN 8 07/29/2014 0441   CREATININE 0.7 08/06/2014 0830   CREATININE 0.63 07/29/2014 0441      Component Value Date/Time   CALCIUM 9.2 08/06/2014 0830   CALCIUM 8.6 07/29/2014 0441   ALKPHOS 120 08/06/2014  0830   ALKPHOS 80 07/26/2014 0613   AST 23 08/06/2014 0830   AST 21 07/26/2014 0613   ALT 21 08/06/2014 0830   ALT 15 07/26/2014 0613   BILITOT 0.39 08/06/2014 0830   BILITOT 0.4 07/26/2014 6546       RADIOGRAPHIC STUDIES: Dg Chest 2 View  07/28/2014   CLINICAL DATA:  Pleural effusion.  EXAM: CHEST  2 VIEW  COMPARISON:  CT 07/25/2014 .  FINDINGS: Mediastinum and hilar structures are normal. Heart size normal. Pulmonary vasculature is normal. Right base atelectasis and/or infiltrates again noted. Small right pleural effusion noted. Old right rib fractures. No pneumothorax.  Reference is made to prior chest CT report for discussion of left apical spiculated nodule. This is not well identified by this chest x-ray. Postsurgical changes left shoulder. Old posttraumatic deformity right shoulder.  IMPRESSION: 1. Previously identified left apical spiculated nodule not well identified by this chest x-ray. Reference is made to chest CT report of 08/04/2014. 2. Right base atelectasis and/or infiltrate. Small right pleural effusion .   Electronically Signed   By: Marcello Moores  Register   On: 07/28/2014 09:03   Dg Chest 2 View  07/25/2014   CLINICAL DATA:  Productive cough, lung cancer  EXAM: CHEST  2 VIEW  COMPARISON:  06/02/2014  FINDINGS: Significant degenerative changes right shoulder again noted. Multiple old right rib fractures. No pulmonary edema. There is small right pleural effusion with right base posterior atelectasis or infiltrate. No pulmonary edema. A small nodule in left apex is stable from prior exam.  IMPRESSION: Small right pleural effusion with right base posterior atelectasis or infiltrate. No pulmonary edema. Multiple old right rib fractures. Degenerative changes right shoulder.   Electronically Signed   By: Lahoma Crocker M.D.   On: 07/25/2014 18:13   Ct Angio Chest Pe W/cm &/or Wo Cm  07/25/2014   CLINICAL DATA:  73 year old male with increasing shortness of breath. Initial encounter. Lung cancer.  EXAM:  CT ANGIOGRAPHY CHEST WITH CONTRAST  TECHNIQUE: Multidetector CT imaging of the chest was performed using the standard protocol during bolus administration of intravenous contrast. Multiplanar CT image reconstructions and MIPs were obtained to evaluate the  vascular anatomy.  CONTRAST:  OMNIPAQUE IOHEXOL 350 MG/ML SOLN  COMPARISON:  Chest CT 06/02/2014 and earlier.  FINDINGS: Good contrast bolus timing in the pulmonary arterial tree. Respiratory motion artifact at the lung bases. No focal filling defect identified in the pulmonary arterial tree to suggest the presence of acute pulmonary embolism.Intermittent retained secretions mostly along the anterior trachea (series 11, image 28). Increased layering right pleural effusion, now moderate. Trace layering left pleural effusion is new.  Spiculated left apical lung nodule is mildly increased measuring 22 x 12 mm on axial images (20 x 9 mm previously). Apical paraseptal emphysema. Increased pulmonary ground-glass opacity, mostly peri-bronchovascular in nature, with continued more focal areas of irregular opacity as documented in June. Increased lower lobe atelectasis. Bilateral peribronchial thickening. No consolidation.  Evidence of a small pericardial effusion along the posterior heart border, probably inconsequential but appears increased. Calcified atherosclerosis of the aorta. Patent visualized major aortic branches. Stable thoracic inlet. No mediastinal or hilar lymphadenopathy.  Stable visualized liver parenchyma. Cholelithiasis. Stable visualized spleen and pancreas. Stable right adrenal gland and visualized kidneys. Left adrenal mass is stable measuring 24 mm diameter. Grossly negative visible bowel in the upper abdomen.  Advanced degenerative changes in the spine. Multiple bilateral chronic rib fractures, some ununited. Advanced probable degenerative osseous changes at the right glenohumeral joint. Sequelae of left glenohumeral arthroplasty. No acute or  suspicious osseous lesion identified.  Review of the MIP images confirms the above findings.  IMPRESSION: 1.  No evidence of acute pulmonary embolus. 2. Increased layering pleural effusions, moderate on the right and small on the left. 3. Increased patchy bilateral pulmonary ground-glass opacity suspicious for acute infectious exacerbation superimposed on chronic lung disease. 4. Stable to mildly increased spiculated left apical lung nodule. Stable left adrenal metastasis.   Electronically Signed   By: Augusto Gamble M.D.   On: 07/25/2014 19:16   ASSESSMENT AND PLAN: This is a very pleasant 73 years old white male with:  1)  stage IV non-small cell lung cancer, adenocarcinoma status post induction chemotherapy with carboplatin and Alimta and currently on maintenance treatment with single agent Alimta status post 6 cycles. He tolerated the last cycle of his treatment fairly well with no significant adverse effects.  His recent CT angiogram of the chest showed no significant evidence for disease progression but the patient continues to have moderate sized right pleural effusion.  I discussed the scan results with the patient and his wife today. I recommended for him to continue his current treatment with single agent maintenance Alimta. He will start cycle #7 today He would come back for followup visit in 3 weeks with the start of cycle #8.  2) right pleural effusion: Most likely malignant in nature. The patient is currently asymptomatic but I would consider him for ultrasound guided thoracentesis or Pleurx catheter placement if he becomes symptomatic with increasing shortness of breath her right-sided chest pain. He knows to call me if you develop any of these symptoms.  3) atrial fibrillation: He'll continue his current medication with Cardizem, digoxin and metoprolol in addition to Eliquis. He'll also continue his routine followup visit with his cardiologist Dr. Johney Frame.  He was advised to call immediately  if he has any concerning symptoms in the interval. The patient voices understanding of current disease status and treatment options and is in agreement with the current care plan.  All questions were answered. The patient knows to call the clinic with any problems, questions or concerns. We can certainly  see the patient much sooner if necessary.  Disclaimer: This note was dictated with voice recognition software. Similar sounding words can inadvertently be transcribed and may not be corrected upon review.

## 2014-08-06 NOTE — Telephone Encounter (Signed)
gv pt appt schedule sept

## 2014-08-12 ENCOUNTER — Ambulatory Visit (HOSPITAL_BASED_OUTPATIENT_CLINIC_OR_DEPARTMENT_OTHER): Payer: Medicare Other | Admitting: Nurse Practitioner

## 2014-08-12 ENCOUNTER — Other Ambulatory Visit (HOSPITAL_BASED_OUTPATIENT_CLINIC_OR_DEPARTMENT_OTHER): Payer: Medicare Other

## 2014-08-12 ENCOUNTER — Ambulatory Visit (HOSPITAL_COMMUNITY)
Admission: RE | Admit: 2014-08-12 | Discharge: 2014-08-12 | Disposition: A | Discharge status: Home / Self Care | Payer: Medicare Other | Source: Ambulatory Visit | Attending: Diagnostic Radiology | Admitting: Diagnostic Radiology

## 2014-08-12 ENCOUNTER — Telehealth: Payer: Self-pay | Admitting: *Deleted

## 2014-08-12 VITALS — BP 122/72 | HR 96 | Temp 97.7°F | Resp 18 | Wt 151.0 lb

## 2014-08-12 DIAGNOSIS — R197 Diarrhea, unspecified: Secondary | ICD-10-CM

## 2014-08-12 DIAGNOSIS — I4891 Unspecified atrial fibrillation: Secondary | ICD-10-CM

## 2014-08-12 DIAGNOSIS — E8809 Other disorders of plasma-protein metabolism, not elsewhere classified: Secondary | ICD-10-CM | POA: Diagnosis not present

## 2014-08-12 DIAGNOSIS — R059 Cough, unspecified: Secondary | ICD-10-CM | POA: Diagnosis not present

## 2014-08-12 DIAGNOSIS — R5381 Other malaise: Secondary | ICD-10-CM

## 2014-08-12 DIAGNOSIS — C3490 Malignant neoplasm of unspecified part of unspecified bronchus or lung: Secondary | ICD-10-CM

## 2014-08-12 DIAGNOSIS — T451X5A Adverse effect of antineoplastic and immunosuppressive drugs, initial encounter: Secondary | ICD-10-CM

## 2014-08-12 DIAGNOSIS — Z8781 Personal history of (healed) traumatic fracture: Secondary | ICD-10-CM | POA: Diagnosis not present

## 2014-08-12 DIAGNOSIS — R5383 Other fatigue: Secondary | ICD-10-CM | POA: Diagnosis not present

## 2014-08-12 DIAGNOSIS — C3410 Malignant neoplasm of upper lobe, unspecified bronchus or lung: Secondary | ICD-10-CM

## 2014-08-12 DIAGNOSIS — J449 Chronic obstructive pulmonary disease, unspecified: Secondary | ICD-10-CM | POA: Diagnosis not present

## 2014-08-12 DIAGNOSIS — E871 Hypo-osmolality and hyponatremia: Secondary | ICD-10-CM

## 2014-08-12 DIAGNOSIS — R530 Neoplastic (malignant) related fatigue: Secondary | ICD-10-CM

## 2014-08-12 DIAGNOSIS — J90 Pleural effusion, not elsewhere classified: Secondary | ICD-10-CM | POA: Diagnosis not present

## 2014-08-12 DIAGNOSIS — R05 Cough: Secondary | ICD-10-CM

## 2014-08-12 DIAGNOSIS — Z85118 Personal history of other malignant neoplasm of bronchus and lung: Secondary | ICD-10-CM | POA: Insufficient documentation

## 2014-08-12 DIAGNOSIS — J4489 Other specified chronic obstructive pulmonary disease: Secondary | ICD-10-CM | POA: Insufficient documentation

## 2014-08-12 DIAGNOSIS — D6481 Anemia due to antineoplastic chemotherapy: Secondary | ICD-10-CM

## 2014-08-12 DIAGNOSIS — J189 Pneumonia, unspecified organism: Secondary | ICD-10-CM | POA: Diagnosis not present

## 2014-08-12 LAB — CBC WITH DIFFERENTIAL/PLATELET
BASO%: 0.1 % (ref 0.0–2.0)
Basophils Absolute: 0 10*3/uL (ref 0.0–0.1)
EOS%: 5 % (ref 0.0–7.0)
Eosinophils Absolute: 0.2 10*3/uL (ref 0.0–0.5)
HCT: 31.7 % — ABNORMAL LOW (ref 38.4–49.9)
HGB: 10.8 g/dL — ABNORMAL LOW (ref 13.0–17.1)
LYMPH%: 19.4 % (ref 14.0–49.0)
MCH: 35.2 pg — ABNORMAL HIGH (ref 27.2–33.4)
MCHC: 34.1 g/dL (ref 32.0–36.0)
MCV: 103.2 fL — ABNORMAL HIGH (ref 79.3–98.0)
MONO#: 0.5 10*3/uL (ref 0.1–0.9)
MONO%: 12.8 % (ref 0.0–14.0)
NEUT#: 2.7 10*3/uL (ref 1.5–6.5)
NEUT%: 62.7 % (ref 39.0–75.0)
Platelets: 321 10*3/uL (ref 140–400)
RBC: 3.07 10*6/uL — AB (ref 4.20–5.82)
RDW: 15.9 % — ABNORMAL HIGH (ref 11.0–14.6)
WBC: 4.3 10*3/uL (ref 4.0–10.3)
lymph#: 0.8 10*3/uL — ABNORMAL LOW (ref 0.9–3.3)

## 2014-08-12 LAB — COMPREHENSIVE METABOLIC PANEL (CC13)
ALK PHOS: 92 U/L (ref 40–150)
ALT: 14 U/L (ref 0–55)
AST: 18 U/L (ref 5–34)
Albumin: 2.7 g/dL — ABNORMAL LOW (ref 3.5–5.0)
Anion Gap: 7 mEq/L (ref 3–11)
BILIRUBIN TOTAL: 0.65 mg/dL (ref 0.20–1.20)
BUN: 13 mg/dL (ref 7.0–26.0)
CO2: 29 meq/L (ref 22–29)
Calcium: 8.8 mg/dL (ref 8.4–10.4)
Chloride: 93 mEq/L — ABNORMAL LOW (ref 98–109)
Creatinine: 0.7 mg/dL (ref 0.7–1.3)
Glucose: 115 mg/dl (ref 70–140)
Potassium: 3.7 mEq/L (ref 3.5–5.1)
SODIUM: 129 meq/L — AB (ref 136–145)
TOTAL PROTEIN: 5.8 g/dL — AB (ref 6.4–8.3)

## 2014-08-12 NOTE — Telephone Encounter (Signed)
VERBAL ORDER AND READ BACK TO DR.MOHAMED- PT. MAY SEE CINDEE BACON,NP. NOTIFIED PT.'S WIFE. SHE WILL GO HOME AND SEE IF HER HUSBAND WILL COME TO THIS OFFICE TO SEE CINDEE BACON,NP.

## 2014-08-12 NOTE — Telephone Encounter (Signed)
   Provider input needed: CONCERNING CHANGE IN PT.'S ENERGY LEVEL AND MEMORY.   Reason for call: POSSIBLE PNEUMONIA  Respiratory: positive for cough, sputum and UNSURE OF THE COLOR OF SPUTUM OR IF PT. HAS A FEVER., PT. IS SLEEPING A LOT.   ALLERGIES:  has No Known Allergies.  Patient last received chemotherapy/ treatment on 08/06/14  Patient was last seen in the office on 08/06/14    Next appt is 08/27/14 WITH [name].  Is patient having fevers greater than 100.5? TEMPERATURE NOT TAKEN.   Is patient having uncontrolled pain, or new pain? no   Is patient having new back pain that changes with position (worsens or eases when laying down?)  no   Is patient able to eat and drink? yes    Is patient able to pass stool without difficulty?   yes, DIARRHEA X3 IN THE LAST 24 HOURS.     Is patient having uncontrolled nausea?  no    family calls 08/12/2014 with complaint of  Respiratory: negative, AS ABOVE   Summary Based on the above information advised family to WAIT.     Anthony Curry M  08/12/2014, 12:22 PM   Background Info  Keyon Liller   DOB: 1940-12-10   MR#: [id number]   CSN#   [id number] 08/12/2014

## 2014-08-12 NOTE — Telephone Encounter (Signed)
PT. IS WILLING TO COME TO THE OFFICE. VERBAL ORDER AND READ BACK TO CINDEE BACON,NP- PT. NEEDS 2 VIEW CHEST XRAY, CBC WITH DIFF, AND CMET. NOTIFIED RADIOLOGY OF PT. COMING FOR A CHEST XRAY. ALSO NOTIFIED PT.'S WIFE OF CHEST XRAY, LAB, AND VISIT. SHE VOICES UNDERSTANDING.

## 2014-08-13 ENCOUNTER — Encounter: Payer: Self-pay | Admitting: Nurse Practitioner

## 2014-08-13 ENCOUNTER — Other Ambulatory Visit: Payer: Self-pay | Admitting: Medical Oncology

## 2014-08-13 DIAGNOSIS — C3491 Malignant neoplasm of unspecified part of right bronchus or lung: Secondary | ICD-10-CM

## 2014-08-13 DIAGNOSIS — D6481 Anemia due to antineoplastic chemotherapy: Secondary | ICD-10-CM | POA: Insufficient documentation

## 2014-08-13 DIAGNOSIS — E8809 Other disorders of plasma-protein metabolism, not elsewhere classified: Secondary | ICD-10-CM | POA: Insufficient documentation

## 2014-08-13 DIAGNOSIS — R5381 Other malaise: Secondary | ICD-10-CM | POA: Insufficient documentation

## 2014-08-13 DIAGNOSIS — R197 Diarrhea, unspecified: Secondary | ICD-10-CM | POA: Insufficient documentation

## 2014-08-13 DIAGNOSIS — T451X5A Adverse effect of antineoplastic and immunosuppressive drugs, initial encounter: Secondary | ICD-10-CM

## 2014-08-13 DIAGNOSIS — R530 Neoplastic (malignant) related fatigue: Secondary | ICD-10-CM | POA: Insufficient documentation

## 2014-08-13 MED ORDER — HYDROCODONE-ACETAMINOPHEN 5-325 MG PO TABS: 1.0000 | ORAL_TABLET | ORAL | Status: DC | PRN

## 2014-08-13 NOTE — Assessment & Plan Note (Signed)
Patient last received his single agent Alimta chemotherapy regimen on 08/06/2014.  He is due for his next cycle of chemotherapy on 08/27/2014.  Last restaging CT angiogram of the chest was obtained 07/25/2014.  Patient did obtain a two-view chest x-ray I today for complaint of dyspnea and occasional cough.  It revealed no new pneumonia.

## 2014-08-13 NOTE — Assessment & Plan Note (Signed)
Patient states that he did have 3 episodes of diarrhea within the past 24 hours; but states that he has been managed with Imodium that he takes on an as-needed basis.

## 2014-08-13 NOTE — Telephone Encounter (Signed)
Requests refill hydrocodone.Rx locked in injection room.

## 2014-08-13 NOTE — Assessment & Plan Note (Signed)
Patient with chronic hyponatremia and chloremia.  This may very well be a chemotherapy side effect. Patient states that he has been trying to minimize salt intake and to recently; it has now increased in the solid and his diet.  Patient was offered IV fluid rehydration today; but patient refused.  He was encouraged to push salt in his diet.

## 2014-08-13 NOTE — Progress Notes (Signed)
[place]   Chief Complaint  Patient presents with  . Shortness of Breath    HPI: Anthony Curry 73 y.o. male diagnosed with lung cancer.  He is currently undergoing single agent Alimta chemotherapy regimen on an every three-week basis.  His last cycle of chemotherapy was 08/06/2014.  Patient's wife called the cancer Center today requesting urgent care visit for the patient.  I she reports the patient has been more fatigued recently; and also has developed a an occasional productive cough.  Patient was recently admitted to the hospital with complaint of shortness of breath; and was diagnosed with a atrial fibrillation at that time.  He has since been initiated with digoxin, cardizem, lopressor, and Eliquis.  He has a follow up appointment with Dr. [name] cardiologist percent tumor 28th 2015.  The patient initiated physical therapy at home just this morning.  He states he did experience one episode of palpitations briefly during his physical therapy.  He denied any specific chest pain or chest pressure, increase shortness of breath, or pain with inspiration during that time.  He currently denies chest pain as well.  He is not currently on any home O2.  He denies any recent fevers or chills.    Shortness of Breath    CURRENT THERAPY: Upcoming Treatment Dates - LUNG Pemetrexed (Alimta) q21d  Days with orders from any treatment category:  No upcoming days in selected categories.    Review of Systems  Respiratory: Positive for shortness of breath.     Past Medical History  Diagnosis Date  . Hypertension   . GERD (gastroesophageal reflux disease)   . Alcohol abuse   . 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 Low sodium   . Cancer     small cell/lung  . Persistent atrial fibrillation 04/11/14    chads2vasc score is 2    Past Surgical History  Procedure Laterality Date  . Hemorrhoid surgery    . Ankle arthrodesis  1995    rt fx-hardware in  . Tonsillectomy     . Colonoscopy    . Excision / curettage bone cyst finger  2005    rt thumb  . Total shoulder arthroplasty  02/03/2012    Procedure: TOTAL SHOULDER ARTHROPLASTY;  Surgeon: [name], MD;  Location: [facility];  Service: Orthopedics;  Laterality: Left;    has HTN (hypertension); Alcohol abuse; Hyponatremia; Anemia of chronic disease; BPH (benign prostatic hyperplasia); GERD (gastroesophageal reflux disease); Fall; Alcohol intoxication; Pulmonary nodules; Adrenal mass; Adrenal hemorrhage; Hypotension; Lung cancer; Swelling of limb; HCAP (healthcare-associated pneumonia); Alcoholism in remission; Atrial flutter by electrocardiogram; Atrial fibrillation with RVR; Neoplastic malignant related fatigue; Physical deconditioning; Hypoalbuminemia; Diarrhea; and Antineoplastic chemotherapy induced anemia(285.3) on his problem list.     has No Known Allergies.    Medication List       This list is accurate as of: 08/12/14 11:59 PM.  Always use your most recent med list.               ACIDOPHILUS PO  Take 1 each by mouth.     ALPRAZolam 0.25 MG tablet  Commonly known as:  XANAX  Take 0.25 mg by mouth 3 (three) times daily as needed for anxiety or sleep.     apixaban 5 MG Tabs tablet  Commonly known as:  ELIQUIS  Take 1 tablet (5 mg total) by mouth 2 (two) times daily.     Biotin 7500 MCG Tabs  Take 1 tablet  by mouth.     calcium carbonate 600 MG Tabs tablet  Commonly known as:  OS-CAL  Take 600 mg by mouth daily with breakfast.     demeclocycline 150 MG tablet  Commonly known as:  DECLOMYCIN  Take 150 mg by mouth 2 (two) times daily.     dexamethasone 4 MG tablet  Commonly known as:  DECADRON  TAKE 1 TABLET BY MOUTH TWICE A DAY THE DAY BEFORE, DAY OF AND DAY AFTER CHEMOTHERAPY EVERY 3 WEEKS     digoxin 0.125 MG tablet  Commonly known as:  LANOXIN  Take 1 tablet (0.125 mg total) by mouth daily.     diltiazem 360 MG 24 hr capsule  Commonly known as:  CARDIZEM  CD  Take 1 capsule (360 mg total) by mouth daily at 12 noon.     doxycycline 100 MG tablet  Commonly known as:  VIBRA-TABS  Take 1 tablet (100 mg total) by mouth every 12 (twelve) hours.     feeding supplement (ENSURE COMPLETE) Liqd  Take 237 mLs by mouth daily.     FISH OIL PO  Take 1 tablet by mouth daily.     furosemide 40 MG tablet  Commonly known as:  LASIX  Take 1 tablet (40 mg total) by mouth daily.     HYDROcodone-acetaminophen 5-325 MG per tablet  Commonly known as:  NORCO/VICODIN  Take 1-2 tablets by mouth every 4 (four) hours as needed.     MAGNESIUM PO  Take 400 mg by mouth daily.     metoprolol tartrate 25 MG tablet  Commonly known as:  LOPRESSOR  Take 1 tablet (25 mg total) by mouth 2 (two) times daily.     multivitamin with minerals Tabs tablet  Take 1 tablet by mouth daily.     omeprazole 20 MG capsule  Commonly known as:  PRILOSEC  Take 20 mg by mouth daily.     OVER THE COUNTER MEDICATION  Inject 1 Units into the vein daily.     Potassium 99 MG Tabs  Take 1 tablet by mouth daily.     SUPER B COMPLEX PO  Take 1 each by mouth.     tamsulosin 0.4 MG Caps capsule  Commonly known as:  FLOMAX  Take 0.4 mg by mouth 2 (two) times daily.     thiamine 100 MG tablet  Commonly known as:  VITAMIN B-1  Take 1 tablet (100 mg total) by mouth daily.     VITAMIN C PO  Take 1,000 mg by mouth daily.     Vitamin D-3 5000 UNITS Tabs  Take 5,000 Units by mouth daily.     vitamin E 400 UNIT capsule  Take 400 Units by mouth daily.         PHYSICAL EXAMINATION  Blood pressure 122/72, pulse 96, temperature 97.7 F (36.5 C), temperature source Oral, resp. rate 18, weight 151 lb (68.493 kg), SpO2 96.00%.  Physical Exam  Nursing note and vitals reviewed. Constitutional: He is oriented to person, place, and time. Vital signs are normal. He appears dehydrated.  HENT:  Head: Normocephalic and atraumatic.  Mouth/Throat: Oropharynx is clear and moist.  Eyes:  Conjunctivae are normal. Pupils are equal, round, and reactive to light.  Neck: Normal range of motion. Neck supple.  Cardiovascular: Normal rate, regular rhythm, normal heart sounds and intact distal pulses.  Exam reveals no friction rub.   No murmur heard. Pulmonary/Chest: Effort normal and breath sounds normal. No respiratory distress. He has no  wheezes. He has no rales.  Abdominal: Soft. Bowel sounds are normal. He exhibits no distension. There is no tenderness. There is no rebound.  Musculoskeletal: Normal range of motion. He exhibits no edema and no tenderness.  Lymphadenopathy:    He has no cervical adenopathy.  Neurological: He is alert and oriented to person, place, and time. Gait normal.  Skin: Skin is warm and dry. No rash noted. No erythema.  Psychiatric: Affect normal.    LABORATORY DATA:. CBC  Lab Results  Component Value Date   WBC 4.3 08/12/2014   RBC 3.07* 08/12/2014   HGB 10.8* 08/12/2014   HCT 31.7* 08/12/2014   PLT 321 08/12/2014   MCV 103.2* 08/12/2014   MCH 35.2* 08/12/2014   MCHC 34.1 08/12/2014   RDW 15.9* 08/12/2014   LYMPHSABS 0.8* 08/12/2014   MONOABS 0.5 08/12/2014   EOSABS 0.2 08/12/2014   BASOSABS 0.0 08/12/2014     CMET  Lab Results  Component Value Date   NA 129* 08/12/2014   K 3.7 08/12/2014   CL 92* 07/29/2014   CO2 29 08/12/2014   GLUCOSE 115 08/12/2014   BUN 13.0 08/12/2014   CREATININE 0.7 08/12/2014   CALCIUM 8.8 08/12/2014   PROT 5.8* 08/12/2014   ALBUMIN 2.7* 08/12/2014   AST 18 08/12/2014   ALT 14 08/12/2014   ALKPHOS 92 08/12/2014   BILITOT 0.65 08/12/2014   GFRNONAA >90 07/29/2014   GFRAA >90 07/29/2014      RADIOGRAPHIC STUDIES: Images    Show images for DG Chest 2 View         Study Result    CLINICAL DATA: Congested. Cough. History lung cancer and recent  pneumonia.  EXAM:  CHEST 2 VIEW  COMPARISON: 07/28/2014 and CT of 07/25/2014  FINDINGS:  Remote right humeral head/ neck fracture. Left shoulder  arthroplasty. Multiple right-sided rib defects  either posttraumatic  or postsurgical. Midline trachea. Normal heart size. Atherosclerosis  in the transverse aorta. Small right pleural effusion, decreased. No  pneumothorax. COPD/chronic bronchitis. Improved right base aeration.  Minimal left base volume loss and scarring or atelectasis.  IMPRESSION:  Decreased right pleural effusion with adjacent decreased airspace  disease. Favor atelectasis.  COPD/chronic bronchitis, without new or acute superimposed finding.  Nonacute bilateral rib fractures with remote proximal right humerus  Trauma.  Electronically Signed  By: Abigail Miyamoto M.D.  On: 08/12/2014 14:07     ASSESSMENT/PLAN:    Hyponatremia  Assessment & Plan Patient with chronic hyponatremia and chloremia.  This may very well be a chemotherapy side effect. Patient states that he has been trying to minimize salt intake and to recently; it has now increased in the solid and his diet.  Patient was offered IV fluid rehydration today; but patient refused.  He was encouraged to push salt in his diet.   Lung cancer  Assessment & Plan Patient last received his single agent Alimta chemotherapy regimen on 08/06/2014.  He is due for his next cycle of chemotherapy on 08/27/2014.  Last restaging CT angiogram of the chest was obtained 07/25/2014.  Patient did obtain a two-view chest x-ray I today for complaint of dyspnea and occasional cough.  It revealed no new pneumonia.   Atrial fibrillation with RVR  Assessment & Plan Patient was admitted to the hospital a few weeks ago with newly diagnosed atrial fibrillation.  He was initiated on digoxin, Cardizem, Lopressor, and Eliquis.  Patient states that he has only felt a palpitation once since discharge from hospital.  He experienced  a very brief episode of palpitation this morning when initiating his physical therapy at home for the first time.  He denied any chest pain, chest pressure, increase shortness of breath, or pain with inspiration  during this episode.  He has a follow up appointment with Dr. [name] cardiologist on 09/01/2014.   Neoplastic malignant related fatigue  Assessment & Plan Increased fatigue is most likely multifactorial;  including chronic anemia and  chemotherapy side effect.  He also be related to multiple cardiac meds to manage patient's newly diagnosed atrial fib.  Patient was encouraged to remain as active as possible.   Physical deconditioning  Assessment & Plan Physical deconditioning is most likely a secondary to lung cancer diagnosis, chemotherapy therapy side effect, chronic anemia.  Patient was encouraged to remain as active as possible.  He actually began physical therapy at home just this morning; and he was encouraged to continue with physical therapy is much as possible.   Hypoalbuminemia  Assessment & Plan Albumin continues to decline.  Patient was encouraged to push protein is much as possible.   Diarrhea  Assessment & Plan  Patient states that he did have 3 episodes of diarrhea within the past 24 hours; but states that he has been managed with Imodium that he takes on an as-needed basis.   Antineoplastic chemotherapy induced anemia(285.3)  Assessment & Plan Chemotherapy-induced anemia is stable at 10.8.  Patient denies any worsening issues with shortness of breath.    Patient stated understanding of all instructions; and was in agreement with this plan of care. The patient knows to call the clinic with any problems, questions or concerns.   This was a shared visit with Dr. [name] today.    Total time spent with patient was 25  minutes;  with greater than 75  percent of that time spent in face to face counseling regarding his symptoms, review of his chest x-ray results,  and coordination of care and follow up.  Disclaimer: This note was dictated with voice recognition software. Similar sounding words can inadvertently be transcribed and may not be corrected upon review.    Drue Second, NP 08/13/2014   ADDENDUM: Hematology/Oncology Attending: I had a face to face encounter with the patient. I recommended his care plan. This is a very pleasant 73 years old white male who was seen today based on his wife his request after she mentioned that the patient has been fatigued and tired for the last few days. He continues to have occasional cough. The patient was recently diagnosed with atrial fibrillation and he is currently on several medications for this condition. When seen today the patient is feeling much better and mentioned that his fatigue was only for a few days after his chemotherapy which is most likely secondary to his recent chemotherapy. I assured the patient and recommended for him to continue with his treatment as scheduled. He was given the option of treatment with Ritalin but he declined. He would come back for followup visit as previously scheduled with the next cycle of his chemotherapy. He was advised to call immediately if he has any concerning symptoms in the interval.  Disclaimer: This note was dictated with voice recognition software. Similar sounding words can inadvertently be transcribed and may be missed upon review. Eilleen Kempf., MD 08/13/2014

## 2014-08-13 NOTE — Assessment & Plan Note (Signed)
Albumin continues to decline.  Patient was encouraged to push protein is much as possible.

## 2014-08-13 NOTE — Assessment & Plan Note (Signed)
Chemotherapy-induced anemia is stable at 10.8.  Patient denies any worsening issues with shortness of breath.

## 2014-08-13 NOTE — Assessment & Plan Note (Signed)
Patient was admitted to the hospital a few weeks ago with newly diagnosed atrial fibrillation.  He was initiated on digoxin, Cardizem, Lopressor, and Eliquis.  Patient states that he has only felt a palpitation once since discharge from hospital.  He experienced a very brief episode of palpitation this morning when initiating his physical therapy at home for the first time.  He denied any chest pain, chest pressure, increase shortness of breath, or pain with inspiration during this episode.  He has a follow up appointment with Dr. [name] cardiologist on 09/01/2014.

## 2014-08-13 NOTE — Assessment & Plan Note (Addendum)
Increased fatigue is most likely multifactorial;  including chronic anemia and  chemotherapy side effect.  He also be related to multiple cardiac meds to manage patient's newly diagnosed atrial fib.  Patient was encouraged to remain as active as possible.

## 2014-08-13 NOTE — Assessment & Plan Note (Signed)
Physical deconditioning is most likely a secondary to lung cancer diagnosis, chemotherapy therapy side effect, chronic anemia.  Patient was encouraged to remain as active as possible.  He actually began physical therapy at home just this morning; and he was encouraged to continue with physical therapy is much as possible.

## 2014-08-27 ENCOUNTER — Ambulatory Visit (HOSPITAL_BASED_OUTPATIENT_CLINIC_OR_DEPARTMENT_OTHER): Payer: Medicare Other

## 2014-08-27 ENCOUNTER — Encounter: Payer: Self-pay | Admitting: Physician Assistant

## 2014-08-27 ENCOUNTER — Telehealth: Payer: Self-pay | Admitting: *Deleted

## 2014-08-27 ENCOUNTER — Ambulatory Visit (HOSPITAL_BASED_OUTPATIENT_CLINIC_OR_DEPARTMENT_OTHER): Payer: Medicare Other | Admitting: Physician Assistant

## 2014-08-27 ENCOUNTER — Telehealth: Payer: Self-pay | Admitting: Physician Assistant

## 2014-08-27 ENCOUNTER — Other Ambulatory Visit (HOSPITAL_BASED_OUTPATIENT_CLINIC_OR_DEPARTMENT_OTHER): Payer: Medicare Other

## 2014-08-27 VITALS — BP 113/60 | HR 72 | Temp 98.0°F | Resp 20 | Ht 72.0 in | Wt 156.6 lb

## 2014-08-27 DIAGNOSIS — C3410 Malignant neoplasm of upper lobe, unspecified bronchus or lung: Secondary | ICD-10-CM

## 2014-08-27 DIAGNOSIS — Z5111 Encounter for antineoplastic chemotherapy: Secondary | ICD-10-CM

## 2014-08-27 DIAGNOSIS — C3430 Malignant neoplasm of lower lobe, unspecified bronchus or lung: Secondary | ICD-10-CM

## 2014-08-27 DIAGNOSIS — C3490 Malignant neoplasm of unspecified part of unspecified bronchus or lung: Secondary | ICD-10-CM

## 2014-08-27 LAB — COMPREHENSIVE METABOLIC PANEL (CC13)
ALT: 11 U/L (ref 0–55)
ANION GAP: 8 meq/L (ref 3–11)
AST: 23 U/L (ref 5–34)
Albumin: 3 g/dL — ABNORMAL LOW (ref 3.5–5.0)
Alkaline Phosphatase: 115 U/L (ref 40–150)
BUN: 9.9 mg/dL (ref 7.0–26.0)
CO2: 31 mEq/L — ABNORMAL HIGH (ref 22–29)
Calcium: 9.5 mg/dL (ref 8.4–10.4)
Chloride: 94 mEq/L — ABNORMAL LOW (ref 98–109)
Creatinine: 0.8 mg/dL (ref 0.7–1.3)
Glucose: 100 mg/dl (ref 70–140)
Potassium: 3.5 mEq/L (ref 3.5–5.1)
SODIUM: 133 meq/L — AB (ref 136–145)
TOTAL PROTEIN: 5.9 g/dL — AB (ref 6.4–8.3)
Total Bilirubin: 0.54 mg/dL (ref 0.20–1.20)

## 2014-08-27 LAB — CBC WITH DIFFERENTIAL/PLATELET
BASO%: 0.9 % (ref 0.0–2.0)
Basophils Absolute: 0.1 10*3/uL (ref 0.0–0.1)
EOS%: 6.4 % (ref 0.0–7.0)
Eosinophils Absolute: 0.4 10*3/uL (ref 0.0–0.5)
HEMATOCRIT: 33.8 % — AB (ref 38.4–49.9)
HGB: 11.3 g/dL — ABNORMAL LOW (ref 13.0–17.1)
LYMPH%: 20 % (ref 14.0–49.0)
MCH: 34.6 pg — ABNORMAL HIGH (ref 27.2–33.4)
MCHC: 33.5 g/dL (ref 32.0–36.0)
MCV: 103.3 fL — ABNORMAL HIGH (ref 79.3–98.0)
MONO#: 0.8 10*3/uL (ref 0.1–0.9)
MONO%: 12.6 % (ref 0.0–14.0)
NEUT#: 3.8 10*3/uL (ref 1.5–6.5)
NEUT%: 60.1 % (ref 39.0–75.0)
Platelets: 485 10*3/uL — ABNORMAL HIGH (ref 140–400)
RBC: 3.27 10*6/uL — ABNORMAL LOW (ref 4.20–5.82)
RDW: 16.2 % — ABNORMAL HIGH (ref 11.0–14.6)
WBC: 6.3 10*3/uL (ref 4.0–10.3)
lymph#: 1.3 10*3/uL (ref 0.9–3.3)

## 2014-08-27 MED ORDER — ONDANSETRON 8 MG/50ML IVPB (CHCC)
8.0000 mg | Freq: Once | INTRAVENOUS | Status: AC
  Administered 2014-08-27: 8 mg via INTRAVENOUS

## 2014-08-27 MED ORDER — ONDANSETRON 8 MG/NS 50 ML IVPB
INTRAVENOUS | Status: AC
  Filled 2014-08-27: qty 8

## 2014-08-27 MED ORDER — DEXAMETHASONE SODIUM PHOSPHATE 10 MG/ML IJ SOLN
INTRAMUSCULAR | Status: AC
  Filled 2014-08-27: qty 1

## 2014-08-27 MED ORDER — SODIUM CHLORIDE 0.9 % IV SOLN
Freq: Once | INTRAVENOUS | Status: AC
  Administered 2014-08-27: 13:00:00 via INTRAVENOUS

## 2014-08-27 MED ORDER — DEXAMETHASONE SODIUM PHOSPHATE 10 MG/ML IJ SOLN
10.0000 mg | Freq: Once | INTRAMUSCULAR | Status: AC
  Administered 2014-08-27: 10 mg via INTRAVENOUS

## 2014-08-27 MED ORDER — SODIUM CHLORIDE 0.9 % IV SOLN
500.0000 mg/m2 | Freq: Once | INTRAVENOUS | Status: AC
  Administered 2014-08-27: 975 mg via INTRAVENOUS
  Filled 2014-08-27: qty 39

## 2014-08-27 NOTE — Progress Notes (Addendum)
[place] [phone]   Fax:([phone]) [place], [place] [address]  DIAGNOSIS: Stage IV (T1 A., N0, M1 B.) non-small cell lung cancer, adenocarcinoma with negative EGFR mutation and negative ALK gene translocation diagnosed in October of 2014.   Molecular profile: Negative for RET, ALK, BRAF, MET, EGFR.  Positive for ERBB2 A775T, [id number], KRAS G13E, MAP2K1 D67N (see full report).   PRIOR THERAPY: Systemic chemotherapy with carboplatin for AUC of 5 and Alimta 500 mg/M2 every 3 weeks, status post 6 cycles with stable disease. First dose on 10/23/2013.[name]   CURRENT THERAPY: Maintenance systemic chemotherapy with single agent Alimta 500 mg/M2 every 3 weeks. Status post 7 cycles.   CHEMOTHERAPY INTENT: Palliative  CURRENT # OF CHEMOTHERAPY CYCLES: 8 CURRENT ANTIEMETICS: Zofran, Compazine and dexamethasone  CURRENT SMOKING STATUS: Former smoker  ORAL CHEMOTHERAPY AND CONSENT: None  CURRENT BISPHOSPHONATES USE: None  PAIN MANAGEMENT: 0/10  NARCOTICS INDUCED CONSTIPATION: None  LIVING WILL AND CODE STATUS: ?   INTERVAL HISTORY: Anthony Curry 73 y.o. male returns to the clinic today for followup visit accompanied by his wife. and daughter. The patient is feeling fine today with no specific complaints except for continued fatigue and generalized malaise after chemotherapy.He reports he has home physical therapy 3 times a week.  He continues to complain of trouble sleeping. He no longer is sleeping well with Xanax. The Xanax was prescribed by his primary care physician. He will follow up with him regarding this issue. palliative radiotherapy to the obstructing lung mass in the right upper lobe under the care of Dr. Sondra Come expected to be completed on 08/13/2014. He is to see his cardiologist on Monday 09/01/2014 regarding his atrial fibrillation with rapid ventricular rate. He  continues on Cardizem in addition to digoxin and metoprolol. He was also started on Eliquis. He is feeling much better after starting the medication for the atrial fibrillation.   MEDICAL HISTORY: Past Medical History  Diagnosis Date  . Hypertension   . GERD (gastroesophageal reflux disease)   . Alcohol abuse   . 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 Low sodium   . Cancer     small cell/lung  . Persistent atrial fibrillation 04/11/14    chads2vasc score is 2    ALLERGIES:  has No Known Allergies.  MEDICATIONS:  Current Outpatient Prescriptions  Medication Sig Dispense Refill  . ALPRAZolam (XANAX) 0.25 MG tablet Take 0.25 mg by mouth 3 (three) times daily as needed for anxiety or sleep.      [name] apixaban (ELIQUIS) 5 MG TABS tablet Take 1 tablet (5 mg total) by mouth 2 (two) times daily.  60 tablet  0  . Ascorbic Acid (VITAMIN C PO) Take 1,000 mg by mouth daily.       . B Complex-C (SUPER B COMPLEX PO) Take 1 each by mouth.      . Biotin 7500 MCG TABS Take 1 tablet by mouth.      . calcium carbonate (OS-CAL) 600 MG TABS Take 600 mg by mouth daily with breakfast.      . Cholecalciferol (VITAMIN D-3) 5000 UNITS TABS Take 5,000 Units by mouth daily.       [name] demeclocycline (DECLOMYCIN) 150 MG tablet Take 150 mg by mouth 2 (two) times daily.      [name] dexamethasone (DECADRON) 4 MG tablet TAKE 1 TABLET BY MOUTH TWICE A DAY THE DAY BEFORE,  DAY OF AND DAY AFTER CHEMOTHERAPY EVERY 3 WEEKS  40 tablet  0  . digoxin (LANOXIN) 0.125 MG tablet Take 1 tablet (0.125 mg total) by mouth daily.  30 tablet  0  . diltiazem (CARDIZEM CD) 360 MG 24 hr capsule Take 1 capsule (360 mg total) by mouth daily at 12 noon.  30 capsule  0  . feeding supplement (ENSURE COMPLETE) LIQD Take 237 mLs by mouth daily.  30 Bottle  0  . furosemide (LASIX) 40 MG tablet Take 1 tablet (40 mg total) by mouth daily.  30 tablet  0  . HYDROcodone-acetaminophen (NORCO/VICODIN) 5-325 MG per tablet Take 1-2 tablets by mouth every 4  (four) hours as needed.  30 tablet  0  . Lactobacillus (ACIDOPHILUS PO) Take 1 each by mouth.      [name] MAGNESIUM PO Take 400 mg by mouth daily.       . metoprolol tartrate (LOPRESSOR) 25 MG tablet Take 1 tablet (25 mg total) by mouth 2 (two) times daily.  60 tablet  0  . Multiple Vitamin (MULTIVITAMIN WITH MINERALS) TABS Take 1 tablet by mouth daily.      . Omega-3 Fatty Acids (FISH OIL PO) Take 1 tablet by mouth daily.      [name] omeprazole (PRILOSEC) 20 MG capsule Take 20 mg by mouth daily.       [name] OVER THE COUNTER MEDICATION Inject 1 Units into the vein daily.      . Potassium 99 MG TABS Take 1 tablet by mouth daily.      . tamsulosin (FLOMAX) 0.4 MG CAPS capsule Take 0.4 mg by mouth 2 (two) times daily.      [name] thiamine (VITAMIN B-1) 100 MG tablet Take 1 tablet (100 mg total) by mouth daily.  30 tablet  6  . vitamin E 400 UNIT capsule Take 400 Units by mouth daily.      [name] doxycycline (VIBRA-TABS) 100 MG tablet Take 1 tablet (100 mg total) by mouth every 12 (twelve) hours.  4 tablet  0   No current facility-administered medications for this visit.    SURGICAL HISTORY:  Past Surgical History  Procedure Laterality Date  . Hemorrhoid surgery    . Ankle arthrodesis  1995    rt fx-hardware in  . Tonsillectomy    . Colonoscopy    . Excision / curettage bone cyst finger  2005    rt thumb  . Total shoulder arthroplasty  02/03/2012    Procedure: TOTAL SHOULDER ARTHROPLASTY;  Surgeon: [name], MD;  Location: [facility];  Service: Orthopedics;  Laterality: Left;    REVIEW OF SYSTEMS:  Constitutional: positive for fatigue and malaise Eyes: negative Ears, nose, mouth, throat, and face: negative Respiratory: negative Cardiovascular: negative Gastrointestinal: negative Genitourinary:negative Integument/breast: negative Hematologic/lymphatic: negative Musculoskeletal:negative Neurological: negative Behavioral/Psych: positive for sleep disturbance Endocrine:  negative Allergic/Immunologic: negative   PHYSICAL EXAMINATION: General appearance: alert, cooperative, fatigued and no distress Head: Normocephalic, without obvious abnormality, atraumatic Neck: no adenopathy, no JVD, supple, symmetrical, trachea midline and thyroid not enlarged, symmetric, no tenderness/mass/nodules Lymph nodes: Cervical, supraclavicular, and axillary nodes normal. Resp: clear to auscultation bilaterally Back: symmetric, no curvature. ROM normal. No CVA tenderness. Cardio: regular rate and rhythm, S1, S2 normal, no murmur, click, rub or gallop GI: soft, non-tender; bowel sounds normal; no masses,  no organomegaly Extremities: extremities normal, atraumatic, no cyanosis or edema Neurologic: Alert and oriented X 3, normal strength and tone. Normal symmetric reflexes. Normal coordination and gait  ECOG PERFORMANCE STATUS: 1 - Symptomatic but completely ambulatory  Blood pressure 113/60, pulse 72, temperature 98 F (36.7 C), temperature source Oral, resp. rate 20, height 6' (1.829 m), weight 156 lb 9.6 oz (71.033 kg), SpO2 95.00%.  LABORATORY DATA: Lab Results  Component Value Date   WBC 6.3 08/27/2014   HGB 11.3* 08/27/2014   HCT 33.8* 08/27/2014   MCV 103.3* 08/27/2014   PLT 485* 08/27/2014      Chemistry      Component Value Date/Time   NA 133* 08/27/2014 1154   NA 129* 07/29/2014 0441   K 3.5 08/27/2014 1154   K 4.0 07/29/2014 0441   CL 92* 07/29/2014 0441   CO2 31* 08/27/2014 1154   CO2 28 07/29/2014 0441   BUN 9.9 08/27/2014 1154   BUN 8 07/29/2014 0441   CREATININE 0.8 08/27/2014 1154   CREATININE 0.63 07/29/2014 0441      Component Value Date/Time   CALCIUM 9.5 08/27/2014 1154   CALCIUM 8.6 07/29/2014 0441   ALKPHOS 115 08/27/2014 1154   ALKPHOS 80 07/26/2014 0613   AST 23 08/27/2014 1154   AST 21 07/26/2014 0613   ALT 11 08/27/2014 1154   ALT 15 07/26/2014 0613   BILITOT 0.54 08/27/2014 1154   BILITOT 0.4 07/26/2014 0613       RADIOGRAPHIC STUDIES: Dg Chest 2  View  07/28/2014   CLINICAL DATA:  Pleural effusion.  EXAM: CHEST  2 VIEW  COMPARISON:  CT 07/25/2014 .  FINDINGS: Mediastinum and hilar structures are normal. Heart size normal. Pulmonary vasculature is normal. Right base atelectasis and/or infiltrates again noted. Small right pleural effusion noted. Old right rib fractures. No pneumothorax.  Reference is made to prior chest CT report for discussion of left apical spiculated nodule. This is not well identified by this chest x-ray. Postsurgical changes left shoulder. Old posttraumatic deformity right shoulder.  IMPRESSION: 1. Previously identified left apical spiculated nodule not well identified by this chest x-ray. Reference is made to chest CT report of 08/04/2014. 2. Right base atelectasis and/or infiltrate. Small right pleural effusion .   Electronically Signed   By: Marcello Moores  Register   On: 07/28/2014 09:03   Dg Chest 2 View  07/25/2014   CLINICAL DATA:  Productive cough, lung cancer  EXAM: CHEST  2 VIEW  COMPARISON:  06/02/2014  FINDINGS: Significant degenerative changes right shoulder again noted. Multiple old right rib fractures. No pulmonary edema. There is small right pleural effusion with right base posterior atelectasis or infiltrate. No pulmonary edema. A small nodule in left apex is stable from prior exam.  IMPRESSION: Small right pleural effusion with right base posterior atelectasis or infiltrate. No pulmonary edema. Multiple old right rib fractures. Degenerative changes right shoulder.   Electronically Signed   By: Lahoma Crocker M.D.   On: 07/25/2014 18:13   Ct Angio Chest Pe W/cm &/or Wo Cm  07/25/2014   CLINICAL DATA:  73 year old male with increasing shortness of breath. Initial encounter. Lung cancer.  EXAM: CT ANGIOGRAPHY CHEST WITH CONTRAST  TECHNIQUE: Multidetector CT imaging of the chest was performed using the standard protocol during bolus administration of intravenous contrast. Multiplanar CT image reconstructions and MIPs were  obtained to evaluate the vascular anatomy.  CONTRAST:  164mL OMNIPAQUE IOHEXOL 350 MG/ML SOLN  COMPARISON:  Chest CT 06/02/2014 and earlier.  FINDINGS: Good contrast bolus timing in the pulmonary arterial tree. Respiratory motion artifact at the lung bases. No focal filling defect identified in the pulmonary arterial tree to  suggest the presence of acute pulmonary embolism.Intermittent retained secretions mostly along the anterior trachea (series 11, image 28). Increased layering right pleural effusion, now moderate. Trace layering left pleural effusion is new.  Spiculated left apical lung nodule is mildly increased measuring 22 x 12 mm on axial images (20 x 9 mm previously). Apical paraseptal emphysema. Increased pulmonary ground-glass opacity, mostly peri-bronchovascular in nature, with continued more focal areas of irregular opacity as documented in June. Increased lower lobe atelectasis. Bilateral peribronchial thickening. No consolidation.  Evidence of a small pericardial effusion along the posterior heart border, probably inconsequential but appears increased. Calcified atherosclerosis of the aorta. Patent visualized major aortic branches. Stable thoracic inlet. No mediastinal or hilar lymphadenopathy.  Stable visualized liver parenchyma. Cholelithiasis. Stable visualized spleen and pancreas. Stable right adrenal gland and visualized kidneys. Left adrenal mass is stable measuring 24 mm diameter. Grossly negative visible bowel in the upper abdomen.  Advanced degenerative changes in the spine. Multiple bilateral chronic rib fractures, some ununited. Advanced probable degenerative osseous changes at the right glenohumeral joint. Sequelae of left glenohumeral arthroplasty. No acute or suspicious osseous lesion identified.  Review of the MIP images confirms the above findings.  IMPRESSION: 1.  No evidence of acute pulmonary embolus. 2. Increased layering pleural effusions, moderate on the right and small on the  left. 3. Increased patchy bilateral pulmonary ground-glass opacity suspicious for acute infectious exacerbation superimposed on chronic lung disease. 4. Stable to mildly increased spiculated left apical lung nodule. Stable left adrenal metastasis.   Electronically Signed   By: Lars Pinks M.D.   On: 07/25/2014 19:16   ASSESSMENT AND PLAN: This is a very pleasant 73 years old white male with:  1)  stage IV non-small cell lung cancer, adenocarcinoma status post induction chemotherapy with carboplatin and Alimta and currently on maintenance treatment with single agent Alimta status post 6 cycles. He tolerated the last cycle of his treatment fairly well with no significant adverse effects.  His recent CT angiogram of the chest showed no significant evidence for disease progression but the patient continues to have moderate sized right pleural effusion.  The patient was discussed with and also seen by Dr. [name] He will continue with his current treatment with single agent maintenance Alimta. He will start cycle #8 today He will follow up in 3 weeks prior to the start of cycle #9.  2) right pleural effusion: Most likely malignant in nature. The patient is currently asymptomatic but we will consider ultrasound guided thoracentesis or Pleurx catheter placement if he becomes symptomatic with increasing shortness of breath her right-sided chest pain. He knows to call  if he develops any of these symptoms.  3) atrial fibrillation: He'll continue his current medication with Cardizem, digoxin and metoprolol in addition to Eliquis. He'll also continue his routine followup visit with his cardiologist Dr. [name].  4) Insomnia: Patient is to follow up with his primary care physician regarding this issue.  He was advised to call immediately if he has any concerning symptoms in the interval. The patient voices understanding of current disease status and treatment options and is in agreement with the current care  plan.  All questions were answered. The patient knows to call the clinic with any problems, questions or concerns. We can certainly see the patient much sooner if necessary.  Carlton Adam PA-C  ADDENDUM: Hematology/Oncology Attending:  I had a face to face encounter with the patient. I recommended to his care plan. This is a very pleasant 73  years old white male with metastatic non-small cell lung cancer, adenocarcinoma status post induction chemotherapy with carboplatin and Alimta and he is currently on maintenance chemotherapy with single agent Alimta status post 7 cycles. He is tolerating his treatment well except for  Generalized fatigue for a few days after the chemotherapy. He also complains of insomnia that is not responding to his treatment with Xanax. He was recently treated for atrial fibrillation and currently on several medications including Cardizem, digoxin and metoprolol. I recommended for him to proceed with cycle #8 today as scheduled. The patient would come back for followup visit in 3 weeks with the next cycle of his treatment. He was advised to call immediately if he has any concerning symptoms and interval.  Disclaimer: This note was dictated with voice recognition software. Similar sounding words can inadvertently be transcribed and may not be corrected upon review. Eilleen Kempf., MD 09/01/2014

## 2014-08-27 NOTE — Telephone Encounter (Signed)
Per staff message and POF I have scheduled appts. Advised scheduler of appts. JMW  

## 2014-08-27 NOTE — Patient Instructions (Addendum)
[facility] Discharge Instructions for Patients Receiving Chemotherapy  Today you received the following chemotherapy agents Alimta To help prevent nausea and vomiting after your treatment, we encourage you to take your nausea medication as prescribed. If you develop nausea and vomiting that is not controlled by your nausea medication, call the clinic.   BELOW ARE SYMPTOMS THAT SHOULD BE REPORTED IMMEDIATELY:  *FEVER GREATER THAN 100.5 F  *CHILLS WITH OR WITHOUT FEVER  NAUSEA AND VOMITING THAT IS NOT CONTROLLED WITH YOUR NAUSEA MEDICATION  *UNUSUAL SHORTNESS OF BREATH  *UNUSUAL BRUISING OR BLEEDING  TENDERNESS IN MOUTH AND THROAT WITH OR WITHOUT PRESENCE OF ULCERS  *URINARY PROBLEMS  *BOWEL PROBLEMS  UNUSUAL RASH Items with * indicate a potential emergency and should be followed up as soon as possible.  Feel free to call the clinic you have any questions or concerns. The clinic phone number is [phone].  Pemetrexed injection What is this medicine? PEMETREXED (PEM e TREX ed) is a chemotherapy drug. This medicine affects cells that are rapidly growing, such as cancer cells and cells in your mouth and stomach. It is usually used to treat lung cancers like non-small cell lung cancer and mesothelioma. It may also be used to treat other cancers. This medicine may be used for other purposes; ask your health care provider or pharmacist if you have questions. COMMON BRAND NAME(S): Alimta What should I tell my health care provider before I take this medicine? They need to know if you have any of these conditions: -if you frequently drink alcohol containing beverages -infection (especially a virus infection such as chickenpox, cold sores, or herpes) -kidney disease -liver disease -low blood counts, like low platelets, red bloods, or white blood cells -an unusual or allergic reaction to pemetrexed, mannitol, other medicines, foods, dyes, or preservatives -pregnant  or trying to get pregnant -breast-feeding How should I use this medicine? This drug is given as an infusion into a vein. It is administered in a hospital or clinic by a specially trained health care professional. Talk to your pediatrician regarding the use of this medicine in children. Special care may be needed. Overdosage: If you think you have taken too much of this medicine contact a poison control center or emergency room at once. NOTE: This medicine is only for you. Do not share this medicine with others. What if I miss a dose? It is important not to miss your dose. Call your doctor or health care professional if you are unable to keep an appointment. What may interact with this medicine? -aspirin and aspirin-like medicines -medicines to increase blood counts like filgrastim, pegfilgrastim, sargramostim -methotrexate -NSAIDS, medicines for pain and inflammation, like ibuprofen or naproxen -probenecid -pyrimethamine -vaccines Talk to your doctor or health care professional before taking any of these medicines: -acetaminophen -aspirin -ibuprofen -ketoprofen -naproxen This list may not describe all possible interactions. Give your health care provider a list of all the medicines, herbs, non-prescription drugs, or dietary supplements you use. Also tell them if you smoke, drink alcohol, or use illegal drugs. Some items may interact with your medicine. What should I watch for while using this medicine? Visit your doctor for checks on your progress. This drug may make you feel generally unwell. This is not uncommon, as chemotherapy can affect healthy cells as well as cancer cells. Report any side effects. Continue your course of treatment even though you feel ill unless your doctor tells you to stop. In some cases, you may be given additional medicines  to help with side effects. Follow all directions for their use. Call your doctor or health care professional for advice if you get a fever,  chills or sore throat, or other symptoms of a cold or flu. Do not treat yourself. This drug decreases your body's ability to fight infections. Try to avoid being around people who are sick. This medicine may increase your risk to bruise or bleed. Call your doctor or health care professional if you notice any unusual bleeding. Be careful brushing and flossing your teeth or using a toothpick because you may get an infection or bleed more easily. If you have any dental work done, tell your dentist you are receiving this medicine. Avoid taking products that contain aspirin, acetaminophen, ibuprofen, naproxen, or ketoprofen unless instructed by your doctor. These medicines may hide a fever. Call your doctor or health care professional if you get diarrhea or mouth sores. Do not treat yourself. To protect your kidneys, drink water or other fluids as directed while you are taking this medicine. Men and women must use effective birth control while taking this medicine. You may also need to continue using effective birth control for a time after stopping this medicine. Do not become pregnant while taking this medicine. Tell your doctor right away if you think that you or your partner might be pregnant. There is a potential for serious side effects to an unborn child. Talk to your health care professional or pharmacist for more information. Do not breast-feed an infant while taking this medicine. This medicine may lower sperm counts. What side effects may I notice from receiving this medicine? Side effects that you should report to your doctor or health care professional as soon as possible: -allergic reactions like skin rash, itching or hives, swelling of the face, lips, or tongue -low blood counts - this medicine may decrease the number of white blood cells, red blood cells and platelets. You may be at increased risk for infections and bleeding. -signs of infection - fever or chills, cough, sore throat, pain or  difficulty passing urine -signs of decreased platelets or bleeding - bruising, pinpoint red spots on the skin, black, tarry stools, blood in the urine -signs of decreased red blood cells - unusually weak or tired, fainting spells, lightheadedness -breathing problems, like a dry cough -changes in emotions or moods -chest pain -confusion -diarrhea -high blood pressure -mouth or throat sores or ulcers -pain, swelling, warmth in the leg -pain on swallowing -swelling of the ankles, feet, hands -trouble passing urine or change in the amount of urine -vomiting -yellowing of the eyes or skin Side effects that usually do not require medical attention (report to your doctor or health care professional if they continue or are bothersome): -hair loss -loss of appetite -nausea -stomach upset This list may not describe all possible side effects. Call your doctor for medical advice about side effects. You may report side effects to FDA at 1-800-FDA-1088. Where should I keep my medicine? This drug is given in a hospital or clinic and will not be stored at home. NOTE: This sheet is a summary. It may not cover all possible information. If you have questions about this medicine, talk to your doctor, pharmacist, or health care provider.  2015, [employer]. (2008-06-24 13:24:03)

## 2014-08-27 NOTE — Telephone Encounter (Signed)
Pt confirmed labs/ov per 09/23 POF, sent msg to add chemo, gave pt AVS....KJ

## 2014-09-01 ENCOUNTER — Telehealth: Payer: Self-pay | Admitting: *Deleted

## 2014-09-01 ENCOUNTER — Ambulatory Visit (INDEPENDENT_AMBULATORY_CARE_PROVIDER_SITE_OTHER): Payer: Medicare Other | Admitting: Internal Medicine

## 2014-09-01 ENCOUNTER — Encounter: Payer: Self-pay | Admitting: *Deleted

## 2014-09-01 ENCOUNTER — Encounter: Payer: Self-pay | Admitting: Internal Medicine

## 2014-09-01 VITALS — BP 136/84 | HR 144 | Ht 72.0 in | Wt 150.8 lb

## 2014-09-01 DIAGNOSIS — C3480 Malignant neoplasm of overlapping sites of unspecified bronchus and lung: Secondary | ICD-10-CM

## 2014-09-01 DIAGNOSIS — I4891 Unspecified atrial fibrillation: Secondary | ICD-10-CM | POA: Diagnosis not present

## 2014-09-01 DIAGNOSIS — Z01812 Encounter for preprocedural laboratory examination: Secondary | ICD-10-CM

## 2014-09-01 DIAGNOSIS — C3490 Malignant neoplasm of unspecified part of unspecified bronchus or lung: Secondary | ICD-10-CM

## 2014-09-01 DIAGNOSIS — I119 Hypertensive heart disease without heart failure: Secondary | ICD-10-CM | POA: Diagnosis not present

## 2014-09-01 LAB — CBC WITH DIFFERENTIAL/PLATELET
Basophils Absolute: 0 K/uL (ref 0.0–0.1)
Basophils Relative: 0.1 % (ref 0.0–3.0)
Eosinophils Absolute: 0 K/uL (ref 0.0–0.7)
Eosinophils Relative: 0.5 % (ref 0.0–5.0)
HCT: 31 % — ABNORMAL LOW (ref 39.0–52.0)
Hemoglobin: 10.3 g/dL — ABNORMAL LOW (ref 13.0–17.0)
Lymphocytes Relative: 6.8 % — ABNORMAL LOW (ref 12.0–46.0)
Lymphs Abs: 0.6 K/uL — ABNORMAL LOW (ref 0.7–4.0)
MCHC: 33.3 g/dL (ref 30.0–36.0)
MCV: 104.2 fl — ABNORMAL HIGH (ref 78.0–100.0)
Monocytes Absolute: 0.1 K/uL (ref 0.1–1.0)
Monocytes Relative: 1.1 % — ABNORMAL LOW (ref 3.0–12.0)
Neutro Abs: 8.1 K/uL — ABNORMAL HIGH (ref 1.4–7.7)
Neutrophils Relative %: 91.5 % — ABNORMAL HIGH (ref 43.0–77.0)
Platelets: 344 K/uL (ref 150.0–400.0)
RBC: 2.98 Mil/uL — ABNORMAL LOW (ref 4.22–5.81)
RDW: 16.1 % — ABNORMAL HIGH (ref 11.5–15.5)
WBC: 8.9 K/uL (ref 4.0–10.5)

## 2014-09-01 LAB — BASIC METABOLIC PANEL
BUN: 19 mg/dL (ref 6–23)
CALCIUM: 8.6 mg/dL (ref 8.4–10.5)
CO2: 28 meq/L (ref 19–32)
CREATININE: 0.6 mg/dL (ref 0.4–1.5)
Chloride: 93 mEq/L — ABNORMAL LOW (ref 96–112)
GFR: 139.63 mL/min (ref >60.00)
Glucose, Bld: 108 mg/dL — ABNORMAL HIGH (ref 70–99)
Potassium: 3.7 mEq/L (ref 3.5–5.1)
Sodium: 127 mEq/L — ABNORMAL LOW (ref 135–145)

## 2014-09-01 LAB — PROTIME-INR
INR: 1.1 ratio — AB (ref 0.8–1.0)
Prothrombin Time: 12.3 s (ref 9.6–13.1)

## 2014-09-01 MED ORDER — APIXABAN 5 MG PO TABS: 5.0000 mg | ORAL_TABLET | Freq: Two times a day (BID) | ORAL | Status: DC

## 2014-09-01 MED ORDER — DIGOXIN 125 MCG PO TABS: 0.1250 mg | ORAL_TABLET | Freq: Every day | ORAL | Status: DC

## 2014-09-01 MED ORDER — DILTIAZEM HCL ER COATED BEADS 360 MG PO CP24: 360.0000 mg | ORAL_CAPSULE | Freq: Every day | ORAL | Status: DC

## 2014-09-01 MED ORDER — METOPROLOL TARTRATE 25 MG PO TABS: 25.0000 mg | ORAL_TABLET | Freq: Two times a day (BID) | ORAL | Status: DC

## 2014-09-01 MED ORDER — FUROSEMIDE 40 MG PO TABS: 40.0000 mg | ORAL_TABLET | Freq: Every day | ORAL | Status: DC

## 2014-09-01 NOTE — Patient Instructions (Signed)
Your physician recommends that you continue on your current medications as directed. Please refer to the Current Medication list given to you today. Your physician recommends that you return for lab work today (CBC, BMET, INR/PT)  Your physician has recommended that you have a Cardioversion (DCCV). Electrical Cardioversion uses a jolt of electricity to your heart either through paddles or wired patches attached to your chest. This is a controlled, usually prescheduled, procedure. Defibrillation is done under light anesthesia in the hospital, and you usually go home the day of the procedure. This is done to get your heart back into a normal rhythm. You are not awake for the procedure. Please see the instruction sheet given to you today.  Your physician recommends that you schedule a follow-up appointment in: [place], NP

## 2014-09-01 NOTE — Progress Notes (Signed)
PCP:  Anthony Austria, MD  The patient presents today for routine electrophysiology followup.  He was diagnosed with atrial fibrillation in May in the setting of treatment for his Stage IV lung CA.  I saw him during his recent hospitalization 8/15 for AF with RVR.  He was recommended to take eliquis but never had this medicine filled.  He was placed on metoprolol, digoxin, and cardizem but has not taken any of his medicines today.  His primary concern is with fatigue and decreased exercise tolerance.  He is otherwise doing reasonably well.  Onc notes reveal pleural effusion for which he may eventually require therapy.  SOB is presently controlled. Today, he denies symptoms of palpitations, chest pain,  lower extremity edema, dizziness, presyncope, syncope, or neurologic sequela.  The patient feels that he is tolerating medications without difficulties and is otherwise without complaint today.   Past Medical History  Diagnosis Date  . Hypertension   . GERD (gastroesophageal reflux disease)   . Alcohol abuse   . 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 Low sodium   . Cancer     small cell/lung  . Persistent atrial fibrillation 04/11/14    chads2vasc score is 2  . Hypotension   . Atrial fibrillation with RVR   . HCAP (healthcare-associated pneumonia)   . Adrenal hemorrhage   . BPH (benign prostatic hyperplasia)   . Hyponatremia   . Anemia of chronic disease   . Fall   . Alcohol intoxication   . Pulmonary nodules   . Adrenal mass   . Atrial flutter by electrocardiogram   . Neoplastic malignant related fatigue   . 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 Alcoholism in remission    Past Surgical History  Procedure Laterality Date  . Hemorrhoid surgery    . Ankle arthrodesis  1995    rt fx-hardware in  . Tonsillectomy    . Colonoscopy    . Excision / curettage bone cyst finger  2005    rt thumb  . Total shoulder arthroplasty  02/03/2012    Procedure: TOTAL  SHOULDER ARTHROPLASTY;  Surgeon: [name], MD;  Location: [facility];  Service: Orthopedics;  Laterality: Left;    Current Outpatient Prescriptions  Medication Sig Dispense Refill  . ALPRAZolam (XANAX) 0.25 MG tablet Take 0.25 mg by mouth 3 (three) times daily as needed for anxiety or sleep.      [name] apixaban (ELIQUIS) 5 MG TABS tablet Take 1 tablet (5 mg total) by mouth 2 (two) times daily.  180 tablet  1  . Ascorbic Acid (VITAMIN C PO) Take 1,000 mg by mouth daily.       . B Complex-C (SUPER B COMPLEX PO) Take 1 each by mouth daily.       . Biotin 7500 MCG TABS Take 1 tablet by mouth daily.       . calcium carbonate (OS-CAL) 600 MG TABS Take 600 mg by mouth daily with breakfast.      . Cholecalciferol (VITAMIN D-3) 5000 UNITS TABS Take 5,000 Units by mouth daily.       [name] demeclocycline (DECLOMYCIN) 150 MG tablet Take 150 mg by mouth 2 (two) times daily.      [name] dexamethasone (DECADRON) 4 MG tablet TAKE 1 TABLET BY MOUTH TWICE A DAY THE DAY BEFORE, DAY OF AND DAY AFTER CHEMOTHERAPY EVERY 3 WEEKS  40 tablet  0  . digoxin (LANOXIN) 0.125 MG tablet Take 1 tablet (0.125 mg total) by  mouth daily.  90 tablet  1  . diltiazem (CARDIZEM CD) 360 MG 24 hr capsule Take 1 capsule (360 mg total) by mouth daily at 12 noon.  90 capsule  3  . doxycycline (VIBRA-TABS) 100 MG tablet Take 1 tablet (100 mg total) by mouth every 12 (twelve) hours.  4 tablet  0  . feeding supplement (ENSURE COMPLETE) LIQD Take 237 mLs by mouth daily.  30 Bottle  0  . furosemide (LASIX) 40 MG tablet Take 1 tablet (40 mg total) by mouth daily.  90 tablet  1  . HYDROcodone-acetaminophen (NORCO/VICODIN) 5-325 MG per tablet Take 1-2 tablets by mouth every 4 (four) hours as needed (pain).      . Lactobacillus (ACIDOPHILUS PO) Take 1 capsule by mouth daily.       [name] MAGNESIUM PO Take 400 mg by mouth daily.       . metoprolol tartrate (LOPRESSOR) 25 MG tablet Take 1 tablet (25 mg total) by mouth 2 (two) times daily.  180  tablet  1  . Multiple Vitamin (MULTIVITAMIN WITH MINERALS) TABS Take 1 tablet by mouth daily.      . Omega-3 Fatty Acids (FISH OIL PO) Take 1 tablet by mouth daily.      [name] omeprazole (PRILOSEC) 20 MG capsule Take 20 mg by mouth daily as needed (heartburn).       [name] OVER THE COUNTER MEDICATION Inject 1 Units into the vein daily.      . Potassium 99 MG TABS Take 1 tablet by mouth daily.      . tamsulosin (FLOMAX) 0.4 MG CAPS capsule Take 0.4 mg by mouth 2 (two) times daily.      [name] thiamine (VITAMIN B-1) 100 MG tablet Take 1 tablet (100 mg total) by mouth daily.  30 tablet  6  . vitamin E 400 UNIT capsule Take 400 Units by mouth daily.       No current facility-administered medications for this visit.    No Known Allergies  History   Social History  . Marital Status: Married    Spouse Name: N/A    Number of Children: N/A  . Years of Education: N/A   Occupational History  . retired    Social History Main Topics  . Smoking status: Former Smoker -- 2.00 packs/day for 30 years    Types: Cigarettes    Quit date: 01/27/1984  . Smokeless tobacco: Not on file  . Alcohol Use: No  . Drug Use: No  . Sexual Activity: Not on file   Other Topics Concern  . Not on file   Social History Narrative   Retired from [employer] History  Problem Relation Age of Onset  . Brain cancer Brother   . Lung cancer Mother   . Hypertension Father   . Diabetes Paternal Grandmother     ROS-  All systems are reviewed and are negative except as outlined in the HPI above  Physical Exam: Filed Vitals:   09/01/14 1025  BP: 136/84  Pulse: 144  Height: 6' (1.829 m)  Weight: 150 lb 12.8 oz (68.402 kg)    GEN- The patient is ill appearing, alert and oriented x 3 today.   Head- normocephalic, atraumatic Eyes-  Sclera clear, conjunctiva pink Ears- hearing intact Oropharynx- clear Neck- supple,   Lungs- decreased BS at the bases, normal work of breathing Heart- tachycardic irregular rhythm GI-  soft, NT, ND, + BS Extremities- no clubbing, cyanosis, trace edema MS- no significant  deformity or atrophy Skin- no rash or lesion Psych- euthymic mood, full affect Neuro- strength and sensation are intact  ekg today reveals afib, V rate 144, nonspecific ST/T changes Echo 07/26/14 is reviewed Epic records including recent hospital records and onc clinic notes are reviewed   Assessment and Plan:  1. Persistent af with RVR Complicated by stage IV lung CA and medical nonadherance Unfortunately, he has not been taking eliquis as prescribed and therefore we cannot proceed with cardioversion at this time. He has also not been taking his AV nodal agents and V rates are therefore elevated.  Compliance is encouraged Return for cardioversion once he has been adequately anticoagulated with eliquis for 3 weeks. No changes today  2. Stage IV lung CA Stable  3. Hypertensive cardiovascular disease Stable No change required today  I had a long discussion with the patient today about code status.  He is not certain presently what his wishes would be.  I have informed him that he has an incurable illness and will likely decline with time.  The importance of determining his end of life wishes was strongly discussed today.  I have encouraged he and his wife to have this discussion.  Return in 3 weeks to arrange caridoversion with Roderic Palau NP in the AF clinic.

## 2014-09-01 NOTE — Patient Instructions (Signed)
Follow up in 3 weeks Keep your appointment with your cardiologist Follow up with your primary care physician regarding your difficulty sleeping

## 2014-09-01 NOTE — Telephone Encounter (Signed)
Received call from Mrs. Mallon. Pt has not been taking Eliquis at all since being discharged from hospital. He was not aware of this until today. Discussed with Dr. [name]. Advised pt that cardioversion 09/03/14 will be cancelled. Provided samples for 1 month for pt to pick up today at front desk. [employer]omed her that someone will be in touch to make a f/u appointment for him with Richardson Dopp, PA for in about 3 weeks. Cardioversion cancelled. Message sent to scheduling to make appointment.

## 2014-09-02 ENCOUNTER — Emergency Department (HOSPITAL_COMMUNITY): Payer: Medicare Other

## 2014-09-02 ENCOUNTER — Inpatient Hospital Stay (HOSPITAL_COMMUNITY)
Admission: EM | Admit: 2014-09-02 | Discharge: 2014-09-05 | DRG: 194 | Disposition: A | Discharge status: Home Health | Payer: Medicare Other | Attending: Internal Medicine | Admitting: Internal Medicine

## 2014-09-02 ENCOUNTER — Encounter (HOSPITAL_COMMUNITY): Payer: Self-pay | Admitting: Emergency Medicine

## 2014-09-02 ENCOUNTER — Telehealth: Payer: Self-pay | Admitting: Medical Oncology

## 2014-09-02 DIAGNOSIS — Y95 Nosocomial condition: Secondary | ICD-10-CM | POA: Diagnosis present

## 2014-09-02 DIAGNOSIS — R509 Fever, unspecified: Secondary | ICD-10-CM | POA: Diagnosis not present

## 2014-09-02 DIAGNOSIS — D638 Anemia in other chronic diseases classified elsewhere: Secondary | ICD-10-CM | POA: Diagnosis present

## 2014-09-02 DIAGNOSIS — K219 Gastro-esophageal reflux disease without esophagitis: Secondary | ICD-10-CM | POA: Diagnosis present

## 2014-09-02 DIAGNOSIS — D6481 Anemia due to antineoplastic chemotherapy: Secondary | ICD-10-CM | POA: Diagnosis present

## 2014-09-02 DIAGNOSIS — C3490 Malignant neoplasm of unspecified part of unspecified bronchus or lung: Secondary | ICD-10-CM | POA: Diagnosis present

## 2014-09-02 DIAGNOSIS — D709 Neutropenia, unspecified: Secondary | ICD-10-CM | POA: Diagnosis present

## 2014-09-02 DIAGNOSIS — E871 Hypo-osmolality and hyponatremia: Secondary | ICD-10-CM | POA: Diagnosis present

## 2014-09-02 DIAGNOSIS — Z808 Family history of malignant neoplasm of other organs or systems: Secondary | ICD-10-CM

## 2014-09-02 DIAGNOSIS — Z7901 Long term (current) use of anticoagulants: Secondary | ICD-10-CM

## 2014-09-02 DIAGNOSIS — E279 Disorder of adrenal gland, unspecified: Secondary | ICD-10-CM | POA: Diagnosis present

## 2014-09-02 DIAGNOSIS — F419 Anxiety disorder, unspecified: Secondary | ICD-10-CM | POA: Diagnosis present

## 2014-09-02 DIAGNOSIS — Z87891 Personal history of nicotine dependence: Secondary | ICD-10-CM

## 2014-09-02 DIAGNOSIS — Z79899 Other long term (current) drug therapy: Secondary | ICD-10-CM

## 2014-09-02 DIAGNOSIS — I481 Persistent atrial fibrillation: Secondary | ICD-10-CM | POA: Diagnosis present

## 2014-09-02 DIAGNOSIS — R339 Retention of urine, unspecified: Secondary | ICD-10-CM | POA: Diagnosis not present

## 2014-09-02 DIAGNOSIS — J90 Pleural effusion, not elsewhere classified: Secondary | ICD-10-CM

## 2014-09-02 DIAGNOSIS — I4891 Unspecified atrial fibrillation: Secondary | ICD-10-CM | POA: Diagnosis present

## 2014-09-02 DIAGNOSIS — R4182 Altered mental status, unspecified: Secondary | ICD-10-CM | POA: Diagnosis not present

## 2014-09-02 DIAGNOSIS — R5081 Fever presenting with conditions classified elsewhere: Secondary | ICD-10-CM | POA: Diagnosis present

## 2014-09-02 DIAGNOSIS — R5381 Other malaise: Secondary | ICD-10-CM

## 2014-09-02 DIAGNOSIS — R530 Neoplastic (malignant) related fatigue: Secondary | ICD-10-CM | POA: Diagnosis present

## 2014-09-02 DIAGNOSIS — R5383 Other fatigue: Secondary | ICD-10-CM

## 2014-09-02 DIAGNOSIS — N400 Enlarged prostate without lower urinary tract symptoms: Secondary | ICD-10-CM | POA: Diagnosis present

## 2014-09-02 DIAGNOSIS — F29 Unspecified psychosis not due to a substance or known physiological condition: Secondary | ICD-10-CM | POA: Diagnosis not present

## 2014-09-02 DIAGNOSIS — N390 Urinary tract infection, site not specified: Secondary | ICD-10-CM | POA: Diagnosis present

## 2014-09-02 DIAGNOSIS — I4892 Unspecified atrial flutter: Secondary | ICD-10-CM | POA: Diagnosis present

## 2014-09-02 DIAGNOSIS — F1021 Alcohol dependence, in remission: Secondary | ICD-10-CM | POA: Diagnosis present

## 2014-09-02 DIAGNOSIS — J918 Pleural effusion in other conditions classified elsewhere: Secondary | ICD-10-CM | POA: Diagnosis present

## 2014-09-02 DIAGNOSIS — Z8249 Family history of ischemic heart disease and other diseases of the circulatory system: Secondary | ICD-10-CM | POA: Diagnosis not present

## 2014-09-02 DIAGNOSIS — F1010 Alcohol abuse, uncomplicated: Secondary | ICD-10-CM

## 2014-09-02 DIAGNOSIS — I10 Essential (primary) hypertension: Secondary | ICD-10-CM | POA: Diagnosis present

## 2014-09-02 DIAGNOSIS — Z801 Family history of malignant neoplasm of trachea, bronchus and lung: Secondary | ICD-10-CM | POA: Diagnosis not present

## 2014-09-02 DIAGNOSIS — R918 Other nonspecific abnormal finding of lung field: Secondary | ICD-10-CM | POA: Diagnosis present

## 2014-09-02 DIAGNOSIS — J189 Pneumonia, unspecified organism: Secondary | ICD-10-CM | POA: Diagnosis present

## 2014-09-02 LAB — COMPREHENSIVE METABOLIC PANEL
ALT: 15 U/L (ref 0–53)
ANION GAP: 15 (ref 5–15)
AST: 25 U/L (ref 0–37)
Albumin: 2.7 g/dL — ABNORMAL LOW (ref 3.5–5.2)
Alkaline Phosphatase: 100 U/L (ref 39–117)
BUN: 16 mg/dL (ref 6–23)
CALCIUM: 8.7 mg/dL (ref 8.4–10.5)
CO2: 23 mEq/L (ref 19–32)
CREATININE: 0.55 mg/dL (ref 0.50–1.35)
Chloride: 90 mEq/L — ABNORMAL LOW (ref 96–112)
GFR calc non Af Amer: >90 mL/min (ref >90)
Glucose, Bld: 105 mg/dL — ABNORMAL HIGH (ref 70–99)
Potassium: 3.5 mEq/L — ABNORMAL LOW (ref 3.7–5.3)
Sodium: 128 mEq/L — ABNORMAL LOW (ref 137–147)
TOTAL PROTEIN: 6.6 g/dL (ref 6.0–8.3)
Total Bilirubin: 0.8 mg/dL (ref 0.3–1.2)

## 2014-09-02 LAB — CBC WITH DIFFERENTIAL/PLATELET
Basophils Absolute: 0 10*3/uL (ref 0.0–0.1)
Basophils Relative: 0 % (ref 0–1)
EOS ABS: 0.1 10*3/uL (ref 0.0–0.7)
EOS PCT: 2 % (ref 0–5)
HCT: 30.6 % — ABNORMAL LOW (ref 39.0–52.0)
HEMOGLOBIN: 10.8 g/dL — AB (ref 13.0–17.0)
Lymphocytes Relative: 19 % (ref 12–46)
Lymphs Abs: 0.8 10*3/uL (ref 0.7–4.0)
MCH: 34.8 pg — AB (ref 26.0–34.0)
MCHC: 35.3 g/dL (ref 30.0–36.0)
MCV: 98.7 fL (ref 78.0–100.0)
MONOS PCT: 10 % (ref 3–12)
Monocytes Absolute: 0.4 10*3/uL (ref 0.1–1.0)
Neutro Abs: 2.9 10*3/uL (ref 1.7–7.7)
Neutrophils Relative %: 69 % (ref 43–77)
PLATELETS: 250 10*3/uL (ref 150–400)
RBC: 3.1 MIL/uL — AB (ref 4.22–5.81)
RDW: 14.6 % (ref 11.5–15.5)
WBC: 4.1 10*3/uL (ref 4.0–10.5)

## 2014-09-02 LAB — URINALYSIS, ROUTINE W REFLEX MICROSCOPIC
BILIRUBIN URINE: NEGATIVE
Glucose, UA: NEGATIVE mg/dL
Hgb urine dipstick: NEGATIVE
KETONES UR: NEGATIVE mg/dL
NITRITE: NEGATIVE
PH: 6.5 (ref 5.0–8.0)
Protein, ur: NEGATIVE mg/dL
Specific Gravity, Urine: 1.017 (ref 1.005–1.030)
Urobilinogen, UA: 1 mg/dL (ref 0.0–1.0)

## 2014-09-02 LAB — STREP PNEUMONIAE URINARY ANTIGEN: Strep Pneumo Urinary Antigen: NEGATIVE

## 2014-09-02 LAB — MRSA PCR SCREENING: MRSA BY PCR: NEGATIVE

## 2014-09-02 LAB — I-STAT CG4 LACTIC ACID, ED: Lactic Acid, Venous: 1.82 mmol/L (ref 0.5–2.2)

## 2014-09-02 LAB — CK: CK TOTAL: 25 U/L (ref 7–232)

## 2014-09-02 LAB — URINE MICROSCOPIC-ADD ON

## 2014-09-02 LAB — DIGOXIN LEVEL

## 2014-09-02 MED ORDER — ONDANSETRON HCL 4 MG/2ML IJ SOLN: 4.0000 mg | Freq: Three times a day (TID) | INTRAMUSCULAR | Status: DC | PRN

## 2014-09-02 MED ORDER — SODIUM CHLORIDE 0.9 % IV SOLN: INTRAVENOUS | Status: DC

## 2014-09-02 MED ORDER — DEXAMETHASONE 4 MG PO TABS
4.0000 mg | ORAL_TABLET | ORAL | Status: DC
  Administered 2014-09-02: 4 mg via ORAL
  Filled 2014-09-02: qty 2

## 2014-09-02 MED ORDER — POTASSIUM CHLORIDE CRYS ER 20 MEQ PO TBCR
40.0000 meq | EXTENDED_RELEASE_TABLET | Freq: Once | ORAL | Status: AC
  Administered 2014-09-02: 40 meq via ORAL
  Filled 2014-09-02: qty 2

## 2014-09-02 MED ORDER — METOPROLOL TARTRATE 1 MG/ML IV SOLN
5.0000 mg | INTRAVENOUS | Status: DC | PRN
  Administered 2014-09-02: 5 mg via INTRAVENOUS
  Filled 2014-09-02 (×2): qty 5

## 2014-09-02 MED ORDER — ALUM & MAG HYDROXIDE-SIMETH 200-200-20 MG/5ML PO SUSP: 30.0000 mL | Freq: Four times a day (QID) | ORAL | Status: DC | PRN

## 2014-09-02 MED ORDER — SODIUM CHLORIDE 0.9 % IV BOLUS (SEPSIS)
1000.0000 mL | Freq: Once | INTRAVENOUS | Status: AC
  Administered 2014-09-02: 1000 mL via INTRAVENOUS

## 2014-09-02 MED ORDER — DILTIAZEM HCL ER COATED BEADS 180 MG PO CP24
180.0000 mg | ORAL_CAPSULE | Freq: Two times a day (BID) | ORAL | Status: DC
  Administered 2014-09-02 – 2014-09-05 (×6): 180 mg via ORAL
  Filled 2014-09-02 (×7): qty 1

## 2014-09-02 MED ORDER — DIGOXIN 125 MCG PO TABS
0.1250 mg | ORAL_TABLET | Freq: Once | ORAL | Status: AC
  Administered 2014-09-02: 0.125 mg via ORAL
  Filled 2014-09-02: qty 1

## 2014-09-02 MED ORDER — ONDANSETRON HCL 4 MG PO TABS: 4.0000 mg | ORAL_TABLET | Freq: Four times a day (QID) | ORAL | Status: DC | PRN

## 2014-09-02 MED ORDER — GUAIFENESIN-DM 100-10 MG/5ML PO SYRP: 5.0000 mL | ORAL_SOLUTION | ORAL | Status: DC | PRN

## 2014-09-02 MED ORDER — RISAQUAD PO CAPS
1.0000 | ORAL_CAPSULE | Freq: Every day | ORAL | Status: DC
Start: 2014-09-02 — End: 2014-09-05
  Administered 2014-09-02 – 2014-09-05 (×4): 1 via ORAL
  Filled 2014-09-02 (×4): qty 1

## 2014-09-02 MED ORDER — VANCOMYCIN HCL IN DEXTROSE 1-5 GM/200ML-% IV SOLN
1000.0000 mg | Freq: Once | INTRAVENOUS | Status: AC
  Administered 2014-09-02: 1000 mg via INTRAVENOUS
  Filled 2014-09-02: qty 200

## 2014-09-02 MED ORDER — ONDANSETRON HCL 4 MG/2ML IJ SOLN: 4.0000 mg | Freq: Four times a day (QID) | INTRAMUSCULAR | Status: DC | PRN

## 2014-09-02 MED ORDER — DIGOXIN 125 MCG PO TABS
0.1250 mg | ORAL_TABLET | Freq: Every day | ORAL | Status: DC
  Administered 2014-09-02 – 2014-09-05 (×4): 0.125 mg via ORAL
  Filled 2014-09-02 (×4): qty 1

## 2014-09-02 MED ORDER — DEXTROSE 5 % IV SOLN
2.0000 g | Freq: Once | INTRAVENOUS | Status: AC
  Administered 2014-09-02: 2 g via INTRAVENOUS
  Filled 2014-09-02: qty 2

## 2014-09-02 MED ORDER — ENSURE COMPLETE PO LIQD
237.0000 mL | Freq: Three times a day (TID) | ORAL | Status: DC
  Administered 2014-09-02 – 2014-09-05 (×8): 237 mL via ORAL

## 2014-09-02 MED ORDER — HYDROCODONE-ACETAMINOPHEN 5-325 MG PO TABS: 1.0000 | ORAL_TABLET | ORAL | Status: DC | PRN

## 2014-09-02 MED ORDER — ALPRAZOLAM 0.25 MG PO TABS
0.2500 mg | ORAL_TABLET | Freq: Three times a day (TID) | ORAL | Status: DC | PRN
  Administered 2014-09-02 – 2014-09-04 (×3): 0.25 mg via ORAL
  Filled 2014-09-02 (×3): qty 1

## 2014-09-02 MED ORDER — VITAMIN B-1 100 MG PO TABS
100.0000 mg | ORAL_TABLET | Freq: Every day | ORAL | Status: DC
  Administered 2014-09-02 – 2014-09-05 (×4): 100 mg via ORAL
  Filled 2014-09-02 (×4): qty 1

## 2014-09-02 MED ORDER — TAMSULOSIN HCL 0.4 MG PO CAPS
0.4000 mg | ORAL_CAPSULE | Freq: Every day | ORAL | Status: DC
Start: 2014-09-02 — End: 2014-09-05
  Administered 2014-09-02 – 2014-09-05 (×4): 0.4 mg via ORAL
  Filled 2014-09-02 (×4): qty 1

## 2014-09-02 MED ORDER — APIXABAN 5 MG PO TABS
5.0000 mg | ORAL_TABLET | Freq: Two times a day (BID) | ORAL | Status: DC
  Administered 2014-09-02 – 2014-09-03 (×3): 5 mg via ORAL
  Filled 2014-09-02 (×5): qty 1

## 2014-09-02 MED ORDER — VANCOMYCIN HCL IN DEXTROSE 750-5 MG/150ML-% IV SOLN
750.0000 mg | Freq: Two times a day (BID) | INTRAVENOUS | Status: DC
  Administered 2014-09-03 – 2014-09-05 (×5): 750 mg via INTRAVENOUS
  Filled 2014-09-02 (×6): qty 150

## 2014-09-02 MED ORDER — PANTOPRAZOLE SODIUM 40 MG PO TBEC
40.0000 mg | DELAYED_RELEASE_TABLET | Freq: Every day | ORAL | Status: DC
  Administered 2014-09-02 – 2014-09-05 (×4): 40 mg via ORAL
  Filled 2014-09-02 (×4): qty 1

## 2014-09-02 MED ORDER — METOPROLOL TARTRATE 50 MG PO TABS
50.0000 mg | ORAL_TABLET | Freq: Two times a day (BID) | ORAL | Status: DC
  Administered 2014-09-02 – 2014-09-05 (×6): 50 mg via ORAL
  Filled 2014-09-02: qty 2
  Filled 2014-09-02 (×5): qty 1
  Filled 2014-09-02 (×2): qty 2

## 2014-09-02 MED ORDER — ALPRAZOLAM 0.25 MG PO TABS
0.2500 mg | ORAL_TABLET | Freq: Once | ORAL | Status: AC
  Administered 2014-09-02: 0.25 mg via ORAL
  Filled 2014-09-02: qty 1

## 2014-09-02 MED ORDER — CALCIUM CARBONATE 1250 (500 CA) MG PO TABS
1.0000 | ORAL_TABLET | Freq: Every day | ORAL | Status: DC
  Administered 2014-09-03 – 2014-09-05 (×3): 500 mg via ORAL
  Filled 2014-09-02 (×4): qty 1

## 2014-09-02 MED ORDER — CALCIUM CARBONATE 600 MG PO TABS: 600.0000 mg | ORAL_TABLET | Freq: Every day | ORAL | Status: DC

## 2014-09-02 MED ORDER — SODIUM CHLORIDE 0.9 % IJ SOLN
3.0000 mL | Freq: Two times a day (BID) | INTRAMUSCULAR | Status: DC
  Administered 2014-09-03 – 2014-09-04 (×3): 3 mL via INTRAVENOUS

## 2014-09-02 MED ORDER — DEXTROSE 5 % IV SOLN
2.0000 g | Freq: Three times a day (TID) | INTRAVENOUS | Status: DC
  Administered 2014-09-02 – 2014-09-04 (×5): 2 g via INTRAVENOUS
  Filled 2014-09-02 (×5): qty 2

## 2014-09-02 MED ORDER — POLYETHYLENE GLYCOL 3350 17 G PO PACK
17.0000 g | PACK | Freq: Every day | ORAL | Status: DC | PRN
  Filled 2014-09-02: qty 1

## 2014-09-02 NOTE — ED Provider Notes (Signed)
CSN: [id number]     Arrival date & time 09/02/14  1320 History   First MD Initiated Contact with Patient 09/02/14 1323     No chief complaint on file.    HPI  Patient presents with concerns of memory loss, disorientation, fatigue. Symptoms seem to be new for the past days, though there have been minor episodes recently. Patient presents at rest of his cancer therapy physician. Patient has stage IV metastatic squamous cell carcinoma, longer. Last chemotherapy was one week ago. Patient r currently denies any new chest pain, dyspnea, fever, chills, other focal complaints.  Notably, during the last hospitalization he was also diagnosed with new afib, started on Eliquis, but has not taken the meds until yesterday, due to pharmacy issues.   Past Medical History  Diagnosis Date  . Hypertension   . GERD (gastroesophageal reflux disease)   . Alcohol abuse   . 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 Low sodium   . Cancer     small cell/lung  . Persistent atrial fibrillation 04/11/14    chads2vasc score is 2  . Hypotension   . Atrial fibrillation with RVR   . HCAP (healthcare-associated pneumonia)   . Adrenal hemorrhage   . BPH (benign prostatic hyperplasia)   . Hyponatremia   . Anemia of chronic disease   . Fall   . Alcohol intoxication   . Pulmonary nodules   . Adrenal mass   . Atrial flutter by electrocardiogram   . Neoplastic malignant related fatigue   . 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 Alcoholism in remission    Past Surgical History  Procedure Laterality Date  . Hemorrhoid surgery    . Ankle arthrodesis  1995    rt fx-hardware in  . Tonsillectomy    . Colonoscopy    . Excision / curettage bone cyst finger  2005    rt thumb  . Total shoulder arthroplasty  02/03/2012    Procedure: TOTAL SHOULDER ARTHROPLASTY;  Surgeon: [name], MD;  Location: [place] [facility];  Service: Orthopedics;  Laterality: Left;   Family History  Problem  Relation Age of Onset  . Brain cancer Brother   . Lung cancer Mother   . Hypertension Father   . Diabetes Paternal Grandmother    History  Substance Use Topics  . Smoking status: Former Smoker -- 2.00 packs/day for 30 years    Types: Cigarettes    Quit date: 01/27/1984  . Smokeless tobacco: Not on file  . Alcohol Use: No    Review of Systems  Constitutional:       Per HPI, otherwise negative  HENT:       Per HPI, otherwise negative  Respiratory:       Per HPI, otherwise negative  Cardiovascular:       Per HPI, otherwise negative  Gastrointestinal: Negative for vomiting.  Endocrine:       Negative aside from HPI  Genitourinary:       Neg aside from HPI   Musculoskeletal:       Per HPI, otherwise negative  Skin: Negative.   Neurological: Negative for syncope.      Allergies  Review of patient's allergies indicates no known allergies.  Home Medications   Prior to Admission medications   Medication Sig Start Date End Date Taking? Authorizing Provider  ALPRAZolam (XANAX) 0.25 MG tablet Take 0.25 mg by mouth 3 (three) times daily as needed for anxiety or sleep.   Yes Historical Provider, MD  apixaban (ELIQUIS) 5 MG TABS tablet Take 1 tablet (5 mg total) by mouth 2 (two) times daily. 09/01/14  Yes Thompson Grayer, MD  Ascorbic Acid (VITAMIN C PO) Take 1,000 mg by mouth daily.    Yes Historical Provider, MD  B Complex-C (SUPER B COMPLEX PO) Take 1 each by mouth daily.    Yes Historical Provider, MD  Biotin 7500 MCG TABS Take 1 tablet by mouth daily.    Yes Historical Provider, MD  calcium carbonate (OS-CAL) 600 MG TABS Take 600 mg by mouth daily with breakfast.   Yes Historical Provider, MD  demeclocycline (DECLOMYCIN) 150 MG tablet Take 150 mg by mouth 2 (two) times daily.   Yes Historical Provider, MD  dexamethasone (DECADRON) 4 MG tablet Take 4 mg by mouth See admin instructions. Take 1 tablet twice a day the day before, day of, and day after chemotherapy every three weeks.    Yes Historical Provider, MD  digoxin (LANOXIN) 0.125 MG tablet Take 1 tablet (0.125 mg total) by mouth daily. 09/01/14  Yes Thompson Grayer, MD  diltiazem (CARDIZEM CD) 360 MG 24 hr capsule Take 1 capsule (360 mg total) by mouth daily at 12 noon. 09/01/14  Yes Thompson Grayer, MD  furosemide (LASIX) 40 MG tablet Take 1 tablet (40 mg total) by mouth daily. 09/01/14  Yes Thompson Grayer, MD  HYDROcodone-acetaminophen (NORCO/VICODIN) 5-325 MG per tablet Take 1-2 tablets by mouth every 4 (four) hours as needed (pain). 08/13/14  Yes [name], MD  Lactobacillus (ACIDOPHILUS) CAPS capsule Take 1 capsule by mouth daily.   Yes Historical Provider, MD  MAGNESIUM PO Take 400 mg by mouth daily.    Yes Historical Provider, MD  metoprolol tartrate (LOPRESSOR) 25 MG tablet Take 1 tablet (25 mg total) by mouth 2 (two) times daily. 09/01/14  Yes Thompson Grayer, MD  Multiple Vitamin (MULTIVITAMIN WITH MINERALS) TABS Take 1 tablet by mouth daily.   Yes Historical Provider, MD  Nutritional Supplements (ENSURE COMPLETE PO) Take 1 Can by mouth 2 (two) times daily.   Yes Historical Provider, MD  Omega-3 Fatty Acids (FISH OIL PO) Take 1 tablet by mouth daily.   Yes Historical Provider, MD  omeprazole (PRILOSEC) 20 MG capsule Take 20 mg by mouth daily as needed (heartburn).    Yes Historical Provider, MD  Potassium 99 MG TABS Take 1 tablet by mouth daily.   Yes Historical Provider, MD  PRESCRIPTION MEDICATION Chemo at [facility]   Yes Historical Provider, MD  tamsulosin (FLOMAX) 0.4 MG CAPS capsule Take 0.4 mg by mouth daily.    Yes Historical Provider, MD  thiamine (VITAMIN B-1) 100 MG tablet Take 1 tablet (100 mg total) by mouth daily. 09/11/13  Yes Erick Colace, NP  vitamin E 400 UNIT capsule Take 400 Units by mouth daily.   Yes Historical Provider, MD   There were no vitals taken for this visit. Physical Exam  Nursing note and vitals reviewed. Constitutional: He is oriented to person, place, and time. He appears ill. No  distress.  HENT:  Head: Normocephalic and atraumatic.  Eyes: Conjunctivae and EOM are normal.  Cardiovascular: Normal rate and regular rhythm.   Pulmonary/Chest: Effort normal. No stridor. No respiratory distress.  Abdominal: He exhibits no distension.  Musculoskeletal: He exhibits no edema.  Neurological: He is alert and oriented to person, place, and time. He displays atrophy. He displays no tremor. No cranial nerve deficit or sensory deficit. He exhibits normal muscle tone. He displays no seizure activity. Coordination normal.  Skin: Skin is warm and dry.  Psychiatric: He has a normal mood and affect.    ED Course  Procedures (including critical care time) Labs Review Labs Reviewed  CBC WITH DIFFERENTIAL - Abnormal; Notable for the following:    RBC 3.10 (*)    Hemoglobin 10.8 (*)    HCT 30.6 (*)    MCH 34.8 (*)    All other components within normal limits  COMPREHENSIVE METABOLIC PANEL - Abnormal; Notable for the following:    Sodium 128 (*)    Potassium 3.5 (*)    Chloride 90 (*)    Glucose, Bld 105 (*)    Albumin 2.7 (*)    All other components within normal limits  DIGOXIN LEVEL - Abnormal; Notable for the following:    Digoxin Level <0.3 (*)    All other components within normal limits  CK  I-STAT CG4 LACTIC ACID, ED    Imaging Review Dg Chest 2 View  09/02/2014   CLINICAL DATA:  Febrile  EXAM: CHEST  2 VIEW  COMPARISON:  08/12/2014  FINDINGS: Cardiac shadow is mildly increased in size. Increasing density over the right lung base is noted consistent with recurrent pleural effusion. No focal confluent infiltrate is seen. Multiple rib defects are again identified on the right and stable in appearance. Postoperative changes left shoulder are again seen. Chronic changes in the right shoulder joint are noted.  IMPRESSION: Increasing right-sided pleural effusion.   Electronically Signed   By: Inez Catalina M.D.   On: 09/02/2014 15:28   Ct Head Wo Contrast  09/02/2014    CLINICAL DATA:  Confusion, altered mental status  EXAM: CT HEAD WITHOUT CONTRAST  TECHNIQUE: Contiguous axial images were obtained from the base of the skull through the vertex without contrast.  COMPARISON:  07/03/2013  FINDINGS: Diffuse brain atrophy noted with chronic white matter microvascular ischemic changes about the ventricles. No acute intracranial hemorrhage, mass infarction, mass lesion, midline shift, herniation, hydrocephalus, or extra-axial fluid collection. Normal gray-white matter differentiation. Cisterns are patent. No cerebellar abnormality. Mastoids and sinuses are clear.  IMPRESSION: Stable atrophy and chronic white matter ischemic changes. No interval change or acute process by noncontrast CT.   Electronically Signed   By: Daryll Brod M.D.   On: 09/02/2014 14:54    3:31 PM Patient is now febrile.  He remains tachycardic.  On the cardiac monitor, the patient is 120, irregular, tachycardic, abnormal  ECG: 123 afib, abnormal  Given the patient's recent chemo, he will be treated w AMX for neutropenia.  I reviewed the x-ray imaging, agree with the interpretation of right-sided pleural effusion.   MDM  Patient presents with delirium.  Notably, the patient has active cancer, is receiving chemotherapy, including one week ago. Patient's evaluation is notable for demonstration of worsening right-sided pleural effusion, and that during his evaluation he becomes febrile, more tachycardic. Patient has not been taking all of his medications secondary to pharmacy concerns.  On patient was started on his digoxin for atrial fibrillation control, provided antibiotics, fluid resuscitation, admitted for further evaluation, management.    Carmin Muskrat, MD 09/02/14 1535

## 2014-09-02 NOTE — Progress Notes (Signed)
ANTIBIOTIC CONSULT NOTE - INITIAL  Pharmacy Consult for Vancomycin, Antibiotic Renal Dose Adjustment Indication: pneumonia  No Known Allergies  Patient Measurements:     Vital Signs: Temp: 100.7 F (38.2 C) (09/29 1456) Temp src: Oral (09/29 1345) BP: 159/68 mmHg (09/29 1600) Pulse Rate: 128 (09/29 1621) Intake/Output from previous day:   Intake/Output from this shift: Total I/O In: 2050 [IV Piggyback:2050] Out: -   Labs:  Recent Labs  09/01/14 1148 09/02/14 1400  WBC 8.9 4.1  HGB 10.3* 10.8*  PLT 344.0 250  CREATININE 0.6 0.55   The CrCl is unknown because both a height and weight (above a minimum accepted value) are required for this calculation. No results found for this basename: VANCOTROUGH, VANCOPEAK, VANCORANDOM, GENTTROUGH, GENTPEAK, GENTRANDOM, TOBRATROUGH, TOBRAPEAK, TOBRARND, AMIKACINPEAK, AMIKACINTROU, AMIKACIN,  in the last 72 hours   Microbiology: No results found for this or any previous visit (from the past 720 hour(s)).  Medical History: Past Medical History  Diagnosis Date  . Hypertension   . GERD (gastroesophageal reflux disease)   . Alcohol abuse   . 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 Low sodium   . Cancer     small cell/lung  . Persistent atrial fibrillation 04/11/14    chads2vasc score is 2  . Hypotension   . Atrial fibrillation with RVR   . HCAP (healthcare-associated pneumonia)   . Adrenal hemorrhage   . BPH (benign prostatic hyperplasia)   . Hyponatremia   . Anemia of chronic disease   . Fall   . Alcohol intoxication   . Pulmonary nodules   . Adrenal mass   . Atrial flutter by electrocardiogram   . Neoplastic malignant related fatigue   . 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 Alcoholism in remission     Assessment: 18 yoM admitted with possible sepsis likely 2/2 HCAP.  He is actively undergoing chemotherapy for stage IV NSCLC.  Pharmacy consulted to dose vancomycin and renal adjust antibiotics  ordered by MD.  Cefepime 2g IV x 1 given in ED.  Vanc 1g IV x 1 ordered in ED but not yet administered.   Tmax: 100.7  WBC: 4.1  Renal: SCr 0.55, CrCl~73 ml/min (CG, using adjust SCr 0.8)  Blood, urine, sputum cultures ordered.  Blood culture x 1 collected.  Goal of Therapy:  Vancomycin trough level 15-20 mcg/ml Doses adjusted per renal function Eradication of infection  Plan:  1.  Vancomycin 1g x 1 now then 750 mg IV q12h. 2.  Continue Aztreonam 2g IV q8h. 3.  F/u SCr, trough levels, culture results, clinical course.  Hershal Coria 09/02/2014,4:58 PM

## 2014-09-02 NOTE — Progress Notes (Signed)
[facility] called about Cm referral for home health Left message for Kristen [phone] Updated pt that [facility] notified him that he will be followed by [facility] staff for any discharge needs Pt voiced appreciation and understanding

## 2014-09-02 NOTE — H&P (Addendum)
Patient Demographics  Anthony Curry, is a 73 y.o. male  MRN: [id number]   DOB - 09-06-1941  Admit Date - 09/02/2014  Outpatient Primary MD for the patient is [name], MD Oncologist Dr. [name]  With History of -  Past Medical History  Diagnosis Date  . Hypertension   . GERD (gastroesophageal reflux disease)   . Alcohol abuse   . 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 Low sodium   . Cancer     small cell/lung  . Persistent atrial fibrillation 04/11/14    chads2vasc score is 2  . Hypotension   . Atrial fibrillation with RVR   . HCAP (healthcare-associated pneumonia)   . Adrenal hemorrhage   . BPH (benign prostatic hyperplasia)   . Hyponatremia   . Anemia of chronic disease   . Fall   . Alcohol intoxication   . Pulmonary nodules   . Adrenal mass   . Atrial flutter by electrocardiogram   . Neoplastic malignant related fatigue   . 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 Alcoholism in remission       Past Surgical History  Procedure Laterality Date  . Hemorrhoid surgery    . Ankle arthrodesis  1995    rt fx-hardware in  . Tonsillectomy    . Colonoscopy    . Excision / curettage bone cyst finger  2005    rt thumb  . Total shoulder arthroplasty  02/03/2012    Procedure: TOTAL SHOULDER ARTHROPLASTY;  Surgeon: [name], MD;  Location: [facility];  Service: Orthopedics;  Laterality: Left;    in for   Chief Complaint  Patient presents with  . Weakness  . Altered Mental Status     HPI  Anthony Curry  is a 73 y.o. male, with history of stage IV non-small cell lung cancer (T1 A., N0, M1 B.) , past history of alcohol abuse quit one year ago, essential hypertension, BPH, GERD, adrenal mass, atrial fibrillation recently diagnosed, who is under the  care of Dr. [name] for his chemotherapy needs and undergoing regular chemotherapy last being one week ago. Comes in with chief complaints of generalized weakness and some confusion ongoing for a couple of days, no headache, no photophobia or neck stiffness, no fevers noted at home. No cough no shortness of breath. No diarrhea or dysuria. No focal weakness.  In the ER workup suggestive of low-grade fever, right-sided pleural effusion on chest x-ray, patient was diagnosed with possible HCAP and I was called to admit the patient. He is currently headache free, no photophobia neck stiffness, he is feeling better and oriented x3.     Review of Systems    In addition to the HPI above,   No Fever-chills, No Headache, No changes with Vision or hearing, mild confusion and fatigue No problems swallowing food or Liquids, No Chest pain, Cough or Shortness of Breath, No Abdominal pain, No Nausea or Vommitting, Bowel movements are  regular, No Blood in stool or Urine, No dysuria, No new skin rashes or bruises, No new joints pains-aches,  No new weakness, tingling, numbness in any extremity, No recent weight gain or loss, No polyuria, polydypsia or polyphagia, No significant Mental Stressors.  A full 10 point Review of Systems was done, except as stated above, all other Review of Systems were negative.   Social History History  Substance Use Topics  . Smoking status: Former Smoker -- 2.00 packs/day for 30 years    Types: Cigarettes    Quit date: 01/27/1984  . Smokeless tobacco: Never Used  . Alcohol Use: No      Family History Family History  Problem Relation Age of Onset  . Brain cancer Brother   . Lung cancer Mother   . Hypertension Father   . Diabetes Paternal Grandmother       Prior to Admission medications   Medication Sig Start Date End Date Taking? Authorizing Provider  ALPRAZolam (XANAX) 0.25 MG tablet Take 0.25 mg by mouth 3 (three) times daily as needed for anxiety or  sleep.   Yes Historical Provider, MD  apixaban (ELIQUIS) 5 MG TABS tablet Take 1 tablet (5 mg total) by mouth 2 (two) times daily. 09/01/14  Yes Thompson Grayer, MD  Ascorbic Acid (VITAMIN C PO) Take 1,000 mg by mouth daily.    Yes Historical Provider, MD  B Complex-C (SUPER B COMPLEX PO) Take 1 each by mouth daily.    Yes Historical Provider, MD  Biotin 7500 MCG TABS Take 1 tablet by mouth daily.    Yes Historical Provider, MD  calcium carbonate (OS-CAL) 600 MG TABS Take 600 mg by mouth daily with breakfast.   Yes Historical Provider, MD  demeclocycline (DECLOMYCIN) 150 MG tablet Take 150 mg by mouth 2 (two) times daily.   Yes Historical Provider, MD  dexamethasone (DECADRON) 4 MG tablet Take 4 mg by mouth See admin instructions. Take 1 tablet twice a day the day before, day of, and day after chemotherapy every three weeks.   Yes Historical Provider, MD  digoxin (LANOXIN) 0.125 MG tablet Take 1 tablet (0.125 mg total) by mouth daily. 09/01/14  Yes Thompson Grayer, MD  diltiazem (CARDIZEM CD) 360 MG 24 hr capsule Take 1 capsule (360 mg total) by mouth daily at 12 noon. 09/01/14  Yes Thompson Grayer, MD  furosemide (LASIX) 40 MG tablet Take 1 tablet (40 mg total) by mouth daily. 09/01/14  Yes Thompson Grayer, MD  HYDROcodone-acetaminophen (NORCO/VICODIN) 5-325 MG per tablet Take 1-2 tablets by mouth every 4 (four) hours as needed (pain). 08/13/14  Yes [name], MD  Lactobacillus (ACIDOPHILUS) CAPS capsule Take 1 capsule by mouth daily.   Yes Historical Provider, MD  MAGNESIUM PO Take 400 mg by mouth daily.    Yes Historical Provider, MD  metoprolol tartrate (LOPRESSOR) 25 MG tablet Take 1 tablet (25 mg total) by mouth 2 (two) times daily. 09/01/14  Yes Thompson Grayer, MD  Multiple Vitamin (MULTIVITAMIN WITH MINERALS) TABS Take 1 tablet by mouth daily.   Yes Historical Provider, MD  Nutritional Supplements (ENSURE COMPLETE PO) Take 1 Can by mouth 2 (two) times daily.   Yes Historical Provider, MD  Omega-3 Fatty  Acids (FISH OIL PO) Take 1 tablet by mouth daily.   Yes Historical Provider, MD  omeprazole (PRILOSEC) 20 MG capsule Take 20 mg by mouth daily as needed (heartburn).    Yes Historical Provider, MD  Potassium 99 MG TABS Take 1 tablet by mouth daily.  Yes Historical Provider, MD  PRESCRIPTION MEDICATION Chemo at [facility]   Yes Historical Provider, MD  tamsulosin (FLOMAX) 0.4 MG CAPS capsule Take 0.4 mg by mouth daily.    Yes Historical Provider, MD  thiamine (VITAMIN B-1) 100 MG tablet Take 1 tablet (100 mg total) by mouth daily. 09/11/13  Yes Erick Colace, NP  vitamin E 400 UNIT capsule Take 400 Units by mouth daily.   Yes Historical Provider, MD    No Known Allergies  Physical Exam  Vitals  Blood pressure 159/68, pulse 128, temperature 100.7 F (38.2 C), temperature source Oral, resp. rate 35, SpO2 95.00%.   1. General middle-aged white male lying in bed in NAD,     2. Normal affect and insight, Not Suicidal or Homicidal, Awake Alert, Oriented X 3.  3. No F.N deficits, ALL C.Nerves Intact, Strength 5/5 all 4 extremities, Sensation intact all 4 extremities, Plantars down going.  4. Ears and Eyes appear Normal, Conjunctivae clear, PERRLA. Moist Oral Mucosa.  5. Supple Neck, No JVD, No cervical lymphadenopathy appriciated, No Carotid Bruits.  6. Symmetrical Chest wall movement, Good air movement bilaterally, right lower lobe rales  7. irregular RRR, No Gallops, Rubs or Murmurs, No Parasternal Heave.  8. Positive Bowel Sounds, Abdomen Soft, No tenderness, No organomegaly appriciated,No rebound -guarding or rigidity.  9.  No Cyanosis, Normal Skin Turgor, No Skin Rash or Bruise.  10. Good muscle tone,  joints appear normal , no effusions, Normal ROM.  11. No Palpable Lymph Nodes in Neck or Axillae     Data Review  CBC  Recent Labs Lab 08/27/14 1154 09/01/14 1148 09/02/14 1400  WBC 6.3 8.9 4.1  HGB 11.3* 10.3* 10.8*  HCT 33.8* 31.0* 30.6*  PLT 485* 344.0 250  MCV  103.3* 104.2* 98.7  MCH 34.6*  --  34.8*  MCHC 33.5 33.3 35.3  RDW 16.2* 16.1* 14.6  LYMPHSABS 1.3 0.6* 0.8  MONOABS 0.8 0.1 0.4  EOSABS 0.4 0.0 0.1  BASOSABS 0.1 0.0 0.0   ------------------------------------------------------------------------------------------------------------------  Chemistries   Recent Labs Lab 08/27/14 1154 09/01/14 1148 09/02/14 1400  NA 133* 127* 128*  K 3.5 3.7 3.5*  CL  --  93* 90*  CO2 31* 28 23  GLUCOSE 100 108* 105*  BUN 9.9 19 16   CREATININE 0.8 0.6 0.55  CALCIUM 9.5 8.6 8.7  AST 23  --  25  ALT 11  --  15  ALKPHOS 115  --  100  BILITOT 0.54  --  0.8   ------------------------------------------------------------------------------------------------------------------ CrCl is unknown because both a height and weight (above a minimum accepted value) are required for this calculation. ------------------------------------------------------------------------------------------------------------------ No results found for this basename: TSH, T4TOTAL, FREET3, T3FREE, THYROIDAB,  in the last 72 hours   Coagulation profile  Recent Labs Lab 09/01/14 1148  INR 1.1*   ------------------------------------------------------------------------------------------------------------------- No results found for this basename: DDIMER,  in the last 72 hours -------------------------------------------------------------------------------------------------------------------  Cardiac Enzymes No results found for this basename: CK, CKMB, TROPONINI, MYOGLOBIN,  in the last 168 hours ------------------------------------------------------------------------------------------------------------------ No components found with this basename: POCBNP,    ---------------------------------------------------------------------------------------------------------------  Urinalysis    Component Value Date/Time   COLORURINE YELLOW 04/10/2014 [place]  04/10/2014 2253   LABSPEC 1.012 04/10/2014 [place] 6.5 04/10/2014 [place] 04/10/2014 [place] 04/10/2014 [place] 04/10/2014 [place] 04/10/2014 [place] 04/10/2014 2253   UROBILINOGEN 0.2 04/10/2014 2253  NITRITE NEGATIVE 04/10/2014 2253   LEUKOCYTESUR NEGATIVE 04/10/2014 2253    ----------------------------------------------------------------------------------------------------------------  Imaging results:   Dg Chest 2 View  09/02/2014   CLINICAL DATA:  Febrile  EXAM: CHEST  2 VIEW  COMPARISON:  08/12/2014  FINDINGS: Cardiac shadow is mildly increased in size. Increasing density over the right lung base is noted consistent with recurrent pleural effusion. No focal confluent infiltrate is seen. Multiple rib defects are again identified on the right and stable in appearance. Postoperative changes left shoulder are again seen. Chronic changes in the right shoulder joint are noted.  IMPRESSION: Increasing right-sided pleural effusion.   Electronically Signed   By: Inez Catalina M.D.   On: 09/02/2014 15:28   Ct Head Wo Contrast  09/02/2014   CLINICAL DATA:  Confusion, altered mental status  EXAM: CT HEAD WITHOUT CONTRAST  TECHNIQUE: Contiguous axial images were obtained from the base of the skull through the vertex without contrast.  COMPARISON:  07/03/2013  FINDINGS: Diffuse brain atrophy noted with chronic white matter microvascular ischemic changes about the ventricles. No acute intracranial hemorrhage, mass infarction, mass lesion, midline shift, herniation, hydrocephalus, or extra-axial fluid collection. Normal gray-white matter differentiation. Cisterns are patent. No cerebellar abnormality. Mastoids and sinuses are clear.  IMPRESSION: Stable atrophy and chronic white matter ischemic changes. No interval change or acute process by noncontrast CT.   Electronically Signed   By: Daryll Brod M.D.   On: 09/02/2014 14:54           Assessment & Plan  1.SIRs vs early sepsis. Likely HC AP, unable to rule out a right-sided infiltrate below effusion, also UA not available and patient could have UTI. History WBC count currently is stable. He is actively undergoing chemotherapy and hence immunosuppressed. I will admit him to step down for 24 hours, blood and urine cultures, empiric IV antibiotics which will include IV vancomycin and meropenem. Supportive care with oxygen and nebulizer treatments. Follow cultures. Stress dose steroids if blood pressure drops due to Decadron.    2. Stage IV non-small cell lung cancer. Undergoing chemotherapy, oncologist is Dr. [name] he has been added to the treatment team. [facility] consult if needed. He is on Decadron which will be continued. If blood pressure drops we will try stress dose steroids.    3. Atrial fibrillation. Continue Cardizem, beta blocker and Eliquis. Also on digoxin. As needed IV Lopressor. Telemetry monitor. Will get baseline EKG.    4. GERD. On PPI continue.    5. BPH. Continue Flomax.    6. Anxiety. At needed Xanax.    7. Hyponatremia. Likely due to dehydration, gentle hydration, check serum osmolality, urine sodium and osmolality. Repeat BMP in the morning.    DVT Prophylaxis Eliquis - SCDs   AM Labs Ordered, also please review Full Orders  Family Communication: Admission, patients condition and plan of care including tests being ordered have been discussed with the patient and wifewho indicate understanding and agree with the plan and Code Status.  Code Status Full  Likely DC to Home  Condition GUARDED    Time spent in minutes :35    SINGH,PRASHANT K M.D on 09/02/2014 at 4:47 PM  Between 7am to 7pm - Pager - [phone]  After 7pm go to [email or web address] - password [email or web address]  And look for the night coverage person covering me after hours  [facility] Office  [phone]   **Disclaimer: This note may have been dictated  with voice recognition software. Similar sounding words can inadvertently be transcribed  and this note may contain transcription errors which may not have been corrected upon publication of note.**

## 2014-09-02 NOTE — ED Notes (Signed)
Per family and pt - pt has not felt well 3 days ago. Last CHEMO last week. Pt states unable to remember people, dates, places, things. Pt concerned about situation and wants to be evaluated. EDP Vanita Panda present during interview. Pt denies any other complaints. Generalized weakness

## 2014-09-02 NOTE — ED Notes (Signed)
Patient transported to X-ray 

## 2014-09-02 NOTE — ED Notes (Signed)
Patient transported to CT 

## 2014-09-02 NOTE — ED Notes (Signed)
Bed: VY72 Expected date:  Expected time:  Means of arrival:  Comments: Cancer ctr patient

## 2014-09-02 NOTE — ED Notes (Signed)
MD at bedside. [place]

## 2014-09-02 NOTE — Telephone Encounter (Signed)
Wife reports a new problem -he is confused ,he does not recognize his therapist who he says every week , he says " he is scared, Please call the doctor". I instructed her to take Anthony Curry to ed for evaluation.

## 2014-09-03 ENCOUNTER — Ambulatory Visit (HOSPITAL_COMMUNITY): Admission: RE | Admit: 2014-09-03 | Payer: Medicare Other | Source: Ambulatory Visit | Admitting: Cardiovascular Disease

## 2014-09-03 ENCOUNTER — Encounter (HOSPITAL_COMMUNITY): Admission: RE | Payer: Self-pay | Source: Ambulatory Visit

## 2014-09-03 LAB — LEGIONELLA ANTIGEN, URINE

## 2014-09-03 LAB — URINE CULTURE
COLONY COUNT: NO GROWTH
CULTURE: NO GROWTH

## 2014-09-03 LAB — CBC
HCT: 25.6 % — ABNORMAL LOW (ref 39.0–52.0)
HEMOGLOBIN: 9 g/dL — AB (ref 13.0–17.0)
MCH: 34.2 pg — AB (ref 26.0–34.0)
MCHC: 35.2 g/dL (ref 30.0–36.0)
MCV: 97.3 fL (ref 78.0–100.0)
PLATELETS: 204 10*3/uL (ref 150–400)
RBC: 2.63 MIL/uL — AB (ref 4.22–5.81)
RDW: 14.4 % (ref 11.5–15.5)
WBC: 4.3 10*3/uL (ref 4.0–10.5)

## 2014-09-03 LAB — CREATININE, URINE, RANDOM: Creatinine, Urine: 96.8 mg/dL

## 2014-09-03 LAB — BASIC METABOLIC PANEL
Anion gap: 10 (ref 5–15)
BUN: 15 mg/dL (ref 6–23)
CO2: 25 mEq/L (ref 19–32)
Calcium: 8.3 mg/dL — ABNORMAL LOW (ref 8.4–10.5)
Chloride: 93 mEq/L — ABNORMAL LOW (ref 96–112)
Creatinine, Ser: 0.55 mg/dL (ref 0.50–1.35)
GFR calc Af Amer: >90 mL/min (ref >90)
Glucose, Bld: 136 mg/dL — ABNORMAL HIGH (ref 70–99)
POTASSIUM: 4.4 meq/L (ref 3.7–5.3)
Sodium: 128 mEq/L — ABNORMAL LOW (ref 137–147)

## 2014-09-03 LAB — OSMOLALITY: Osmolality: 260 mOsm/kg — ABNORMAL LOW (ref 275–300)

## 2014-09-03 LAB — OSMOLALITY, URINE: Osmolality, Ur: 583 mOsm/kg (ref 390–1090)

## 2014-09-03 LAB — SODIUM, URINE, RANDOM: Sodium, Ur: 73 mEq/L

## 2014-09-03 LAB — HIV ANTIBODY (ROUTINE TESTING W REFLEX): HIV 1&2 Ab, 4th Generation: NONREACTIVE

## 2014-09-03 SURGERY — CARDIOVERSION
Anesthesia: Monitor Anesthesia Care

## 2014-09-03 MED ORDER — GUAIFENESIN ER 600 MG PO TB12
600.0000 mg | ORAL_TABLET | Freq: Two times a day (BID) | ORAL | Status: DC
  Administered 2014-09-03 – 2014-09-05 (×5): 600 mg via ORAL
  Filled 2014-09-03 (×6): qty 1

## 2014-09-03 MED ORDER — DEXAMETHASONE 4 MG PO TABS: 4.0000 mg | ORAL_TABLET | Freq: Two times a day (BID) | ORAL | Status: DC

## 2014-09-03 NOTE — Progress Notes (Signed)
UR completed 

## 2014-09-03 NOTE — Progress Notes (Signed)
Patient Demographics  Anthony Curry, is a 73 y.o. male, DOB - 12-15-1940, TDD:[id number]  Admit date - 09/02/2014   Admitting Physician Thurnell Lose, MD  Outpatient Primary MD for the patient is Anthony Austria, MD  LOS - 1   Chief Complaint  Patient presents with  . Weakness  . Altered Mental Status        Subjective:   Anthony Curry today has, No headache, No chest pain, No abdominal pain - No Nausea, No new weakness tingling or numbness, reports he is feeling better,  reports some cough, with productive yellow sputum. Assessment & Plan    Principal Problem:   HCAP (healthcare-associated pneumonia) Active Problems:   Alcohol abuse   Anemia of chronic disease   GERD (gastroesophageal reflux disease)   Pulmonary nodules   Lung cancer   Atrial flutter by electrocardiogram   Atrial fibrillation with RVR   Neoplastic malignant related fatigue   Neutropenic fever  HCAP -Chest x-ray showing worsening right pleural effusion, recently finished chemotherapy, and febrile overnight at 101.3, given his immunocompromised status as recently finished IV antibiotic, continue with IV cefepime and vancomycin, will follow on blood culture -Will add Mucinex, request respiratory therapy to see for flutter valve.  UTI - Patient is on broad-spectrum IV antibiotics, follow on the urine culture.   Hyponatremia - Mild, asymptomatic.  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  Stage IV non-small cell lung cancer.  -Undergoing chemotherapy, oncologist is Dr. Julien Curry he has been added to the treatment team. Continue with Decadron.  Atrial fibrillation.  -Continue Cardizem, beta blocker and Eliquis. Also on  digoxin. As needed IV Lopressor. Telemetry monitor.  - Heart rate better control.  GERD - On PPI continue.   BPH - Continue Flomax.    Anxiety - At needed Xanax.       Code Status: Full  Family Communication: Wife is at bedtime.  Disposition Plan: Home.   Procedures    Consults      Medications  Scheduled Meds: . acidophilus  1 capsule Oral Daily  . apixaban  5 mg Oral BID  . aztreonam  2 g Intravenous 3 times per day  . calcium carbonate  1 tablet Oral Q breakfast  . dexamethasone  4 mg Oral See admin instructions  . digoxin  0.125 mg Oral Daily  . diltiazem  180 mg Oral BID  . feeding supplement (ENSURE COMPLETE)  237 mL Oral TID BM  . metoprolol tartrate  50 mg Oral BID  . pantoprazole  40 mg Oral Daily  . sodium chloride  3 mL Intravenous Q12H  . tamsulosin  0.4 mg Oral Daily  . thiamine  100 mg Oral Daily  . vancomycin  750 mg Intravenous Q12H   Continuous Infusions: . sodium chloride 50 mL/hr at 09/02/14 1725   PRN Meds:.ALPRAZolam, alum & mag hydroxide-simeth, guaiFENesin-dextromethorphan, HYDROcodone-acetaminophen, metoprolol, ondansetron (ZOFRAN) IV, ondansetron, polyethylene glycol  DVT Prophylaxis  Eliquis-SCDs.  Lab Results  Component Value Date   PLT 204 09/03/2014    Antibiotics    Anti-infectives   Start  Dose/Rate Route Frequency Ordered Stop   09/03/14 0600  vancomycin (VANCOCIN) IVPB 750 mg/150 ml premix     750 mg 150 mL/hr over 60 Minutes Intravenous Every 12 hours 09/02/14 1715     09/02/14 1800  aztreonam (AZACTAM) 2 g in dextrose 5 % 50 mL IVPB     2 g 100 mL/hr over 30 Minutes Intravenous 3 times per day 09/02/14 1657 09/10/14 2159   09/02/14 1545  vancomycin (VANCOCIN) IVPB 1000 mg/200 mL premix     1,000 mg 200 mL/hr over 60 Minutes Intravenous  Once 09/02/14 1538 09/02/14 1815   09/02/14 1530  ceFEPIme (MAXIPIME) 2 g in dextrose 5 % 50 mL IVPB     2 g 100 mL/hr over 30 Minutes Intravenous  Once 09/02/14 1528  09/02/14 1610          Objective:   Filed Vitals:   09/03/14 0315 09/03/14 0400 09/03/14 0408 09/03/14 0800  BP: 132/75 108/61  121/54  Pulse: 82 59  58  Temp: 97.7 F (36.5 C)   98.4 F (36.9 C)  TempSrc: Oral   Oral  Resp: 23 26  28   Height:      Weight:   66.4 kg (146 lb 6.2 oz)   SpO2: 100% 99%  97%    Wt Readings from Last 3 Encounters:  09/03/14 66.4 kg (146 lb 6.2 oz)  09/01/14 68.402 kg (150 lb 12.8 oz)  08/27/14 71.033 kg (156 lb 9.6 oz)     Intake/Output Summary (Last 24 hours) at 09/03/14 1009 Last data filed at 09/03/14 0555  Gross per 24 hour  Intake   3420 ml  Output    610 ml  Net   2810 ml     Physical Exam  Awake Alert, Oriented X 3, No new F.N deficits, Normal affect [place].AT,PERRAL Supple Neck,No JVD, No cervical lymphadenopathy appriciated.  Symmetrical Chest wall movement, Good air movement bilaterally, right lower rales. IRREGULAR IRREGULAR,No Gallops,Rubs or new Murmurs, No Parasternal Heave +ve B.Sounds, Abd Soft, No tenderness, No organomegaly appriciated, No rebound - guarding or rigidity. No Cyanosis, Clubbing or edema, No new Rash or bruise    Data Review   Micro Results Recent Results (from the past 240 hour(s))  CULTURE, BLOOD (SINGLE)     Status: None   Collection Time    09/02/14  3:53 PM      Result Value Ref Range Status   Specimen Description BLOOD RIGHT ANTECUBITAL   Final   Special Requests BOTTLES DRAWN AEROBIC AND ANAEROBIC 4 ML   Final   Culture  Setup Time     Final   Value: 09/02/2014 22:31     Performed at [facility]   Culture     Final   Value:        BLOOD CULTURE RECEIVED NO GROWTH TO DATE CULTURE WILL BE HELD FOR 5 DAYS BEFORE ISSUING A FINAL NEGATIVE REPORT     Performed at [facility]   Report Status PENDING   Incomplete  MRSA PCR SCREENING     Status: None   Collection Time    09/02/14  4:53 PM      Result Value Ref Range Status   MRSA by PCR NEGATIVE  NEGATIVE Final   Comment:             The GeneXpert MRSA Assay (FDA     approved for NASAL specimens     only), is one component of a  comprehensive MRSA colonization     surveillance program. It is not     intended to diagnose MRSA     infection nor to guide or     monitor treatment for     MRSA infections.    Radiology Reports Dg Chest 2 View  09/02/2014   CLINICAL DATA:  Febrile  EXAM: CHEST  2 VIEW  COMPARISON:  08/12/2014  FINDINGS: Cardiac shadow is mildly increased in size. Increasing density over the right lung base is noted consistent with recurrent pleural effusion. No focal confluent infiltrate is seen. Multiple rib defects are again identified on the right and stable in appearance. Postoperative changes left shoulder are again seen. Chronic changes in the right shoulder joint are noted.  IMPRESSION: Increasing right-sided pleural effusion.   Electronically Signed   By: Inez Catalina M.D.   On: 09/02/2014 15:28   Ct Head Wo Contrast  09/02/2014   CLINICAL DATA:  Confusion, altered mental status  EXAM: CT HEAD WITHOUT CONTRAST  TECHNIQUE: Contiguous axial images were obtained from the base of the skull through the vertex without contrast.  COMPARISON:  07/03/2013  FINDINGS: Diffuse brain atrophy noted with chronic white matter microvascular ischemic changes about the ventricles. No acute intracranial hemorrhage, mass infarction, mass lesion, midline shift, herniation, hydrocephalus, or extra-axial fluid collection. Normal gray-white matter differentiation. Cisterns are patent. No cerebellar abnormality. Mastoids and sinuses are clear.  IMPRESSION: Stable atrophy and chronic white matter ischemic changes. No interval change or acute process by noncontrast CT.   Electronically Signed   By: Daryll Brod M.D.   On: 09/02/2014 14:54    CBC  Recent Labs Lab 08/27/14 1154 09/01/14 1148 09/02/14 1400 09/03/14 0355  WBC 6.3 8.9 4.1 4.3  HGB 11.3* 10.3* 10.8* 9.0*  HCT 33.8* 31.0* 30.6* 25.6*  PLT 485* 344.0 250  204  MCV 103.3* 104.2* 98.7 97.3  MCH 34.6*  --  34.8* 34.2*  MCHC 33.5 33.3 35.3 35.2  RDW 16.2* 16.1* 14.6 14.4  LYMPHSABS 1.3 0.6* 0.8  --   MONOABS 0.8 0.1 0.4  --   EOSABS 0.4 0.0 0.1  --   BASOSABS 0.1 0.0 0.0  --     Chemistries   Recent Labs Lab 08/27/14 1154 09/01/14 1148 09/02/14 1400 09/03/14 0355  NA 133* 127* 128* 128*  K 3.5 3.7 3.5* 4.4  CL  --  93* 90* 93*  CO2 31* 28 23 25   GLUCOSE 100 108* 105* 136*  BUN 9.9 19 16 15   CREATININE 0.8 0.6 0.55 0.55  CALCIUM 9.5 8.6 8.7 8.3*  AST 23  --  25  --   ALT 11  --  15  --   ALKPHOS 115  --  100  --   BILITOT 0.54  --  0.8  --    ------------------------------------------------------------------------------------------------------------------ estimated creatinine clearance is 76.1 ml/min (by C-G formula based on Cr of 0.55). ------------------------------------------------------------------------------------------------------------------ No results found for this basename: HGBA1C,  in the last 72 hours ------------------------------------------------------------------------------------------------------------------ No results found for this basename: CHOL, HDL, LDLCALC, TRIG, CHOLHDL, LDLDIRECT,  in the last 72 hours ------------------------------------------------------------------------------------------------------------------ No results found for this basename: TSH, T4TOTAL, FREET3, T3FREE, THYROIDAB,  in the last 72 hours ------------------------------------------------------------------------------------------------------------------ No results found for this basename: VITAMINB12, FOLATE, FERRITIN, TIBC, IRON, RETICCTPCT,  in the last 72 hours  Coagulation profile  Recent Labs Lab 09/01/14 1148  INR 1.1*    No results found for this basename: DDIMER,  in the last 72 hours  Cardiac Enzymes  No results found for this basename: CK, CKMB, TROPONINI, MYOGLOBIN,  in the last 168  hours ------------------------------------------------------------------------------------------------------------------ No components found with this basename: POCBNP,      Time Spent in minutes   35 minutes   Tamy Accardo M.D on 09/03/2014 at 10:09 AM  Between 7am to 7pm - Pager - [phone]  After 7pm go to [email or web address] - password [email or web address]  And look for the night coverage person covering for me after hours  [facility] Office  [phone]   **Disclaimer: This note may have been dictated with voice recognition software. Similar sounding words can inadvertently be transcribed and this note may contain transcription errors which may not have been corrected upon publication of note.**

## 2014-09-03 NOTE — Progress Notes (Signed)
MD. Notified of bladder scan results: 426ml. Awaiting call back. [place]

## 2014-09-03 NOTE — Progress Notes (Signed)
Pt with multiple attempts to void, unable. Assisted up to bedside commode at least 3 times no urine. Then pt slept for short period. Bladder scanned when he became restless called and received orders for in and out cath. Pt complained of pain on insertion went slow and minimal resistance felt. No bleeding present. Voided 450 ml dark urine.

## 2014-09-03 NOTE — Evaluation (Signed)
Physical Therapy Evaluation Patient Details Name: Anthony Curry MRN: [id number] DOB: 1941/11/19 Today's Date: 09/03/2014   History of Present Illness  73 y.o. male admitted with confusion and HCAP. PMHx: HTN, Stage IV non-small cell lung cancer undergoing chemo, hx of alcohol abuse, a-fib  Clinical Impression  Pt currently with functional limitations due to the deficits listed below (see PT Problem List).  Pt will benefit from skilled PT to increase their independence and safety with mobility to allow discharge to the venue listed below.  Pt reports he has been steadily declining in mobility since starting chemo and was working with [facility] however had to cancel last 3 sessions due to not feeling well.  Pt states he fatigues quickly, and his spouse makes sure he always has supervision at home.     Follow Up Recommendations Home health PT;Supervision for mobility/OOB    Equipment Recommendations  None recommended by PT    Recommendations for Other Services       Precautions / Restrictions Precautions Precautions: Fall      Mobility  Bed Mobility Overal bed mobility: Modified Independent                Transfers Overall transfer level: Needs assistance   Transfers: Sit to/from Stand Sit to Stand: Min guard         General transfer comment: unsteady with rise however self corrected, min/guard for safety  Ambulation/Gait Ambulation/Gait assistance: Min guard Ambulation Distance (Feet): 200 Feet Assistive device: Straight cane Gait Pattern/deviations: Step-through pattern     General Gait Details: very unsteady gait however no true LOB observed, recommended pt use RW upon d/c home for safety, HR 110 during ambulation  Stairs            Wheelchair Mobility    Modified Rankin (Stroke Patients Only)       Balance Overall balance assessment: History of Falls (pt reports due to chemo)                                           Pertinent  Vitals/Pain Pain Assessment: No/denies pain    Home Living Family/patient expects to be discharged to:: Private residence Living Arrangements: Spouse/significant other;Children Available Help at Discharge: Available 24 hours/day;Family Type of Home: House Home Access: Level entry     Home Layout: Able to live on main level with bedroom/bathroom;Two level Home Equipment: Cane - single point;Walker - 2 wheels      Prior Function Level of Independence: Independent with assistive device(s)         Comments: pt reports usually dizzy and unsteady first few feet initially however has chairs around home in case of break/sitting needed     Hand Dominance        Extremity/Trunk Assessment               Lower Extremity Assessment: Generalized weakness         Communication   Communication: No difficulties  Cognition Arousal/Alertness: Awake/alert Behavior During Therapy: WFL for tasks assessed/performed Overall Cognitive Status: Within Functional Limits for tasks assessed                      General Comments      Exercises        Assessment/Plan    PT Assessment Patient needs continued PT services  PT Diagnosis Difficulty walking   PT  Problem List Decreased strength;Decreased activity tolerance;Decreased balance;Decreased mobility  PT Treatment Interventions Gait training;DME instruction;Balance training;Neuromuscular re-education;Functional mobility training;Therapeutic activities;Therapeutic exercise;Patient/family education   PT Goals (Current goals can be found in the Care Plan section) Acute Rehab PT Goals PT Goal Formulation: With patient Time For Goal Achievement: 09/10/14 Potential to Achieve Goals: Good    Frequency Min 3X/week   Barriers to discharge        Co-evaluation               End of Session   Activity Tolerance: Patient limited by fatigue Patient left: in bed;with call bell/phone within reach;with family/visitor  present           Time: 6950-7225 PT Time Calculation (min): 13 min   Charges:   PT Evaluation $Initial PT Evaluation Tier I: 1 Procedure PT Treatments $Gait Training: 8-22 mins   PT G Codes:          Jazz Rogala,KATHrine E 09/03/2014, 3:40 PM Carmelia Bake, PT, DPT 09/03/2014 Pager: [phone]

## 2014-09-03 NOTE — Progress Notes (Signed)
Pt arrived unit from ICU accompanied by family. Alert and oriented, will continue with current plan of care.

## 2014-09-03 NOTE — Progress Notes (Signed)
Hand off report called to [place], Therapist, sports.  Transferring to room 1510.  Spouse called.

## 2014-09-04 ENCOUNTER — Telehealth: Payer: Self-pay | Admitting: Internal Medicine

## 2014-09-04 DIAGNOSIS — C3490 Malignant neoplasm of unspecified part of unspecified bronchus or lung: Secondary | ICD-10-CM

## 2014-09-04 DIAGNOSIS — J189 Pneumonia, unspecified organism: Principal | ICD-10-CM

## 2014-09-04 DIAGNOSIS — J90 Pleural effusion, not elsewhere classified: Secondary | ICD-10-CM

## 2014-09-04 DIAGNOSIS — E871 Hypo-osmolality and hyponatremia: Secondary | ICD-10-CM

## 2014-09-04 LAB — TYPE AND SCREEN
ABO/RH(D): O POS
Antibody Screen: NEGATIVE

## 2014-09-04 LAB — CBC
HCT: 23.1 % — ABNORMAL LOW (ref 39.0–52.0)
Hemoglobin: 8 g/dL — ABNORMAL LOW (ref 13.0–17.0)
MCH: 34.2 pg — ABNORMAL HIGH (ref 26.0–34.0)
MCHC: 34.6 g/dL (ref 30.0–36.0)
MCV: 98.7 fL (ref 78.0–100.0)
PLATELETS: 186 10*3/uL (ref 150–400)
RBC: 2.34 MIL/uL — AB (ref 4.22–5.81)
RDW: 14.4 % (ref 11.5–15.5)
WBC: 5.5 10*3/uL (ref 4.0–10.5)

## 2014-09-04 LAB — OCCULT BLOOD X 1 CARD TO LAB, STOOL: FECAL OCCULT BLD: NEGATIVE

## 2014-09-04 LAB — BASIC METABOLIC PANEL
ANION GAP: 11 (ref 5–15)
BUN: 16 mg/dL (ref 6–23)
CO2: 25 meq/L (ref 19–32)
Calcium: 8.5 mg/dL (ref 8.4–10.5)
Chloride: 94 mEq/L — ABNORMAL LOW (ref 96–112)
Creatinine, Ser: 0.52 mg/dL (ref 0.50–1.35)
GFR calc non Af Amer: >90 mL/min (ref >90)
Glucose, Bld: 99 mg/dL (ref 70–99)
POTASSIUM: 4.4 meq/L (ref 3.7–5.3)
Sodium: 130 mEq/L — ABNORMAL LOW (ref 137–147)

## 2014-09-04 LAB — ABO/RH: ABO/RH(D): O POS

## 2014-09-04 LAB — HEMOGLOBIN AND HEMATOCRIT, BLOOD
HEMATOCRIT: 25.8 % — AB (ref 39.0–52.0)
HEMOGLOBIN: 9.1 g/dL — AB (ref 13.0–17.0)

## 2014-09-04 NOTE — Progress Notes (Signed)
Patient Demographics  Anthony Curry, is a 73 y.o. male, DOB - Dec 24, 1940, ZHY:[id number]  Admit date - 09/02/2014   Admitting Physician Thurnell Lose, MD  Outpatient Primary MD for the patient is Anthony Austria, MD  LOS - 2   Chief Complaint  Patient presents with  . Weakness  . Altered Mental Status        Subjective:   [name] today has, No headache, No chest pain, No abdominal pain - No Nausea, No new weakness tingling or numbness, reports he is feeling better,  Repots cough has much improved. Assessment & Plan    Principal Problem:   HCAP (healthcare-associated pneumonia) Active Problems:   Alcohol abuse   Anemia of chronic disease   GERD (gastroesophageal reflux disease)   Pulmonary nodules   Lung cancer   Atrial flutter by electrocardiogram   Atrial fibrillation with RVR   Neoplastic malignant related fatigue   Neutropenic fever   HCAP -Chest x-ray showing worsening right pleural effusion, recently finished chemotherapy, and febrile101.3 on 9/29, given his immunocompromised status as recently finished IV chemotherapy, well continue with IV cefepime and vancomycin, will follow on blood culture -Will add Mucinex, request respiratory therapy to see for flutter valve.  UTI - Patient is on broad-spectrum IV antibiotics, follow on the urine culture (no growth up-to-date)  Anemia -Patient has significant drop in his hemoglobin from 10.8 on admission to 8 (10/1), he is Hemoccult negative, I will hold his Eliquis pending further workup, will recheck his hemoglobin at 2 PM .  Hyponatremia - Mild, asymptomatic, improving with fluid restriction .  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   urinary retention - Appears to be resolved, continue to monitor closely .  Stage IV non-small cell lung cancer.  -Undergoing chemotherapy, oncologist is Dr. [name] he has been added to the treatment team. Continue with Decadron.  Atrial fibrillation.  -Continue Cardizem, beta blocker . Also on digoxin. As needed IV Lopressor. Telemetry monitor.  - Heart rate controled .  GERD - On PPI continue.   BPH - Continue Flomax.    Anxiety - At needed Xanax.       Code Status: Full  Family Communication: Wife is at bedside.  Disposition Plan: Home.   Procedures    Consults      Medications  Scheduled Meds: . acidophilus  1 capsule Oral Daily  . aztreonam  2 g Intravenous 3 times per day  . calcium carbonate  1 tablet Oral Q breakfast  . [START ON 09/16/2014] dexamethasone  4 mg Oral BID  . digoxin  0.125 mg Oral Daily  . diltiazem  180 mg Oral BID  . feeding supplement (ENSURE COMPLETE)  237 mL Oral TID BM  . guaiFENesin  600 mg Oral BID  . metoprolol tartrate  50 mg Oral BID  . pantoprazole  40 mg Oral Daily  . sodium chloride  3 mL Intravenous Q12H  . tamsulosin  0.4 mg Oral Daily  . thiamine  100 mg Oral Daily  . vancomycin  750 mg Intravenous Q12H   Continuous Infusions:  PRN Meds:.ALPRAZolam, alum & mag hydroxide-simeth, guaiFENesin-dextromethorphan, HYDROcodone-acetaminophen, metoprolol, ondansetron (ZOFRAN) IV, ondansetron, polyethylene glycol  DVT Prophylaxis  Eliquis on hold  (10/1) -SCDs.  Lab Results  Component Value Date   PLT 186 09/04/2014    Antibiotics    Anti-infectives   Start     Dose/Rate Route Frequency Ordered Stop   09/03/14 0600  vancomycin (VANCOCIN) IVPB 750 mg/150 ml premix     750 mg 150 mL/hr over 60 Minutes Intravenous Every 12 hours 09/02/14 1715     09/02/14 1800  aztreonam (AZACTAM) 2 g in dextrose 5 % 50 mL IVPB     2 g 100 mL/hr over 30 Minutes Intravenous 3 times per day 09/02/14 1657 09/10/14 2159    09/02/14 1545  vancomycin (VANCOCIN) IVPB 1000 mg/200 mL premix     1,000 mg 200 mL/hr over 60 Minutes Intravenous  Once 09/02/14 1538 09/02/14 1815   09/02/14 1530  ceFEPIme (MAXIPIME) 2 g in dextrose 5 % 50 mL IVPB     2 g 100 mL/hr over 30 Minutes Intravenous  Once 09/02/14 1528 09/02/14 1610          Objective:   Filed Vitals:   09/03/14 1341 09/03/14 2050 09/04/14 0525 09/04/14 0700  BP: 122/52 115/57 108/54 124/58  Pulse: 77 55 72 64  Temp: 97.8 F (36.6 C) 98.2 F (36.8 C) 97.8 F (36.6 C) 98.2 F (36.8 C)  TempSrc: Oral Oral Oral Oral  Resp: 22 20 20    Height:      Weight:   70.8 kg (156 lb 1.4 oz)   SpO2: 98% 96% 97% 98%    Wt Readings from Last 3 Encounters:  09/04/14 70.8 kg (156 lb 1.4 oz)  09/01/14 68.402 kg (150 lb 12.8 oz)  08/27/14 71.033 kg (156 lb 9.6 oz)     Intake/Output Summary (Last 24 hours) at 09/04/14 0902 Last data filed at 09/04/14 0800  Gross per 24 hour  Intake    524 ml  Output    800 ml  Net   -276 ml     Physical Exam  Awake Alert, Oriented X 3, No new F.N deficits, Normal affect [place].AT,PERRAL Supple Neck,No JVD, No cervical lymphadenopathy appriciated.  Symmetrical Chest wall movement, Good air movement bilaterally, right lower rales. IRREGULAR IRREGULAR,No Gallops,Rubs or new Murmurs, No Parasternal Heave +ve B.Sounds, Abd Soft, No tenderness, No organomegaly appriciated, No rebound - guarding or rigidity. No Cyanosis, Clubbing or edema, No new Rash or bruise    Data Review   Micro Results Recent Results (from the past 240 hour(s))  CULTURE, BLOOD (SINGLE)     Status: None   Collection Time    09/02/14  3:53 PM      Result Value Ref Range Status   Specimen Description BLOOD RIGHT ANTECUBITAL   Final   Special Requests BOTTLES DRAWN AEROBIC AND ANAEROBIC 4 ML   Final   Culture  Setup Time     Final   Value: 09/02/2014 22:31     Performed at [facility]   Culture     Final   Value:        BLOOD CULTURE  RECEIVED NO GROWTH TO DATE CULTURE WILL BE HELD FOR 5 DAYS BEFORE ISSUING A FINAL NEGATIVE REPORT     Performed at [facility]   Report Status PENDING   Incomplete  MRSA PCR SCREENING     Status: None   Collection Time    09/02/14  4:53 PM  Result Value Ref Range Status   MRSA by PCR NEGATIVE  NEGATIVE Final   Comment:            The GeneXpert MRSA Assay (FDA     approved for NASAL specimens     only), is one component of a     comprehensive MRSA colonization     surveillance program. It is not     intended to diagnose MRSA     infection nor to guide or     monitor treatment for     MRSA infections.  URINE CULTURE     Status: None   Collection Time    09/02/14  6:17 PM      Result Value Ref Range Status   Specimen Description URINE, CLEAN CATCH   Final   Special Requests NONE   Final   Culture  Setup Time     Final   Value: 09/02/2014 22:44     Performed at [place]     Final   Value: NO GROWTH     Performed at [facility]   Culture     Final   Value: NO GROWTH     Performed at [facility]   Report Status 09/03/2014 FINAL   Final    Radiology Reports Dg Chest 2 View  09/02/2014   CLINICAL DATA:  Febrile  EXAM: CHEST  2 VIEW  COMPARISON:  08/12/2014  FINDINGS: Cardiac shadow is mildly increased in size. Increasing density over the right lung base is noted consistent with recurrent pleural effusion. No focal confluent infiltrate is seen. Multiple rib defects are again identified on the right and stable in appearance. Postoperative changes left shoulder are again seen. Chronic changes in the right shoulder joint are noted.  IMPRESSION: Increasing right-sided pleural effusion.   Electronically Signed   By: Inez Catalina M.D.   On: 09/02/2014 15:28   Ct Head Wo Contrast  09/02/2014   CLINICAL DATA:  Confusion, altered mental status  EXAM: CT HEAD WITHOUT CONTRAST  TECHNIQUE: Contiguous axial images were obtained from the  base of the skull through the vertex without contrast.  COMPARISON:  07/03/2013  FINDINGS: Diffuse brain atrophy noted with chronic white matter microvascular ischemic changes about the ventricles. No acute intracranial hemorrhage, mass infarction, mass lesion, midline shift, herniation, hydrocephalus, or extra-axial fluid collection. Normal gray-white matter differentiation. Cisterns are patent. No cerebellar abnormality. Mastoids and sinuses are clear.  IMPRESSION: Stable atrophy and chronic white matter ischemic changes. No interval change or acute process by noncontrast CT.   Electronically Signed   By: Daryll Brod M.D.   On: 09/02/2014 14:54    CBC  Recent Labs Lab 09/01/14 1148 09/02/14 1400 09/03/14 0355 09/04/14 0505  WBC 8.9 4.1 4.3 5.5  HGB 10.3* 10.8* 9.0* 8.0*  HCT 31.0* 30.6* 25.6* 23.1*  PLT 344.0 250 204 186  MCV 104.2* 98.7 97.3 98.7  MCH  --  34.8* 34.2* 34.2*  MCHC 33.3 35.3 35.2 34.6  RDW 16.1* 14.6 14.4 14.4  LYMPHSABS 0.6* 0.8  --   --   MONOABS 0.1 0.4  --   --   EOSABS 0.0 0.1  --   --   BASOSABS 0.0 0.0  --   --     Chemistries   Recent Labs Lab 09/01/14 1148 09/02/14 1400 09/03/14 0355 09/04/14 0505  NA 127* 128* 128* 130*  K 3.7 3.5* 4.4 4.4  CL 93* 90* 93* 94*  CO2 28 23 25 25   GLUCOSE 108* 105* 136* 99  BUN 19 16 15 16   CREATININE 0.6 0.55 0.55 0.52  CALCIUM 8.6 8.7 8.3* 8.5  AST  --  25  --   --   ALT  --  15  --   --   ALKPHOS  --  100  --   --   BILITOT  --  0.8  --   --    ------------------------------------------------------------------------------------------------------------------ estimated creatinine clearance is 81.1 ml/min (by C-G formula based on Cr of 0.52). ------------------------------------------------------------------------------------------------------------------ No results found for this basename: HGBA1C,  in the last 72  hours ------------------------------------------------------------------------------------------------------------------ No results found for this basename: CHOL, HDL, LDLCALC, TRIG, CHOLHDL, LDLDIRECT,  in the last 72 hours ------------------------------------------------------------------------------------------------------------------ No results found for this basename: TSH, T4TOTAL, FREET3, T3FREE, THYROIDAB,  in the last 72 hours ------------------------------------------------------------------------------------------------------------------ No results found for this basename: VITAMINB12, FOLATE, FERRITIN, TIBC, IRON, RETICCTPCT,  in the last 72 hours  Coagulation profile  Recent Labs Lab 09/01/14 1148  INR 1.1*    No results found for this basename: DDIMER,  in the last 72 hours  Cardiac Enzymes No results found for this basename: CK, CKMB, TROPONINI, MYOGLOBIN,  in the last 168 hours ------------------------------------------------------------------------------------------------------------------ No components found with this basename: POCBNP,      Time Spent in minutes   30 minutes   Dontavia Brand M.D on 09/04/2014 at 9:02 AM  Between 7am to 7pm - Pager - [phone]  After 7pm go to [email or web address] - password [email or web address]  And look for the night coverage person covering for me after hours  [facility] Office  [phone]   **Disclaimer: This note may have been dictated with voice recognition software. Similar sounding words can inadvertently be transcribed and this note may contain transcription errors which may not have been corrected upon publication of note.**

## 2014-09-04 NOTE — Telephone Encounter (Signed)
PA called and approved for PA #[id number] for 12 months

## 2014-09-04 NOTE — Care Management Note (Addendum)
    Page 1 of 1   09/05/2014     1:39:06 PM CARE MANAGEMENT NOTE 09/05/2014  Patient:  Anthony Curry, Anthony Curry   Account Number:  [id number]  Date Initiated:  09/04/2014  Documentation initiated by:  Dessa Phi  Subjective/Objective Assessment:   73 Y/O M ADMITTED W/PNA.     Action/Plan:   FROM HOME.ACTIVE [facility] HHRN/PT.   Anticipated DC Date:  09/05/2014   Anticipated DC Plan:  [place]  CM consult      [facility] Choice  Resumption Of Svcs/PTA Provider   Choice offered to / List presented to:  C-1 Patient        [place] arranged  HH-1 RN  [place].   Status of service:  Completed, signed off Medicare Important Message given?  YES (If response is "NO", the following Medicare IM given date fields will be blank) Date Medicare IM given:  09/05/2014 Medicare IM given by:  [facility] Date Additional Medicare IM given:   Additional Medicare IM given by:    Discharge Disposition:  [place]  Per UR Regulation:  Reviewed for med. necessity/level of care/duration of stay  If discussed at [place] of Stay Meetings, dates discussed:    Comments:  09/05/14 Juliano Mceachin RN,BSN NCM [place] HHRN/PT ORDERED, KRISTEN [facility] REP AWARE OF D/C. NO FURTHER D/C NEEDS.  09/04/14 Kabeer Hoagland RN,BSN NCM [place] W/HHRN/PT,KRISTEN ALREADY FOLLOWING. AWAIT FINAL HHRN/HHPT ORDER.

## 2014-09-04 NOTE — Telephone Encounter (Signed)
New problem   Pt's wife stated an auth need to be called in for pt's medication to be filled for Eliquis to [phone] [facility].

## 2014-09-04 NOTE — Progress Notes (Signed)
[facility]  Patient Status: Active (receiving services up to time of hospitalization)  [facility] is providing the following services: RN and PT  If patient discharges after hours, please call [phone].   Anthony Curry 09/04/2014, 9:45 AM

## 2014-09-04 NOTE — Progress Notes (Signed)
PT Cancellation Note  Patient Details Name: Anthony Curry MRN: [id number] DOB: 1941-06-04   Cancelled Treatment:    Reason Eval/Treat Not Completed: Fatigue/lethargy limiting ability to participate Pt reports having no energy and very fatigued today so politely declined therapy however would like therapy to check back tomorrow.   Maddix Kliewer,KATHrine E 09/04/2014, 2:05 PM Carmelia Bake, PT, DPT 09/04/2014 Pager: [phone]

## 2014-09-05 LAB — BASIC METABOLIC PANEL
Anion gap: 10 (ref 5–15)
BUN: 12 mg/dL (ref 6–23)
CO2: 28 mEq/L (ref 19–32)
CREATININE: 0.57 mg/dL (ref 0.50–1.35)
Calcium: 8.7 mg/dL (ref 8.4–10.5)
Chloride: 89 mEq/L — ABNORMAL LOW (ref 96–112)
GLUCOSE: 83 mg/dL (ref 70–99)
Potassium: 4.4 mEq/L (ref 3.7–5.3)
Sodium: 127 mEq/L — ABNORMAL LOW (ref 137–147)

## 2014-09-05 LAB — CBC
HEMATOCRIT: 25.3 % — AB (ref 39.0–52.0)
HEMOGLOBIN: 8.9 g/dL — AB (ref 13.0–17.0)
MCH: 34.8 pg — AB (ref 26.0–34.0)
MCHC: 35.2 g/dL (ref 30.0–36.0)
MCV: 98.8 fL (ref 78.0–100.0)
Platelets: 204 10*3/uL (ref 150–400)
RBC: 2.56 MIL/uL — ABNORMAL LOW (ref 4.22–5.81)
RDW: 14.4 % (ref 11.5–15.5)
WBC: 6.3 10*3/uL (ref 4.0–10.5)

## 2014-09-05 LAB — VANCOMYCIN, TROUGH: Vancomycin Tr: 9.8 ug/mL — ABNORMAL LOW (ref 10.0–20.0)

## 2014-09-05 MED ORDER — VANCOMYCIN HCL IN DEXTROSE 1-5 GM/200ML-% IV SOLN
1000.0000 mg | Freq: Two times a day (BID) | INTRAVENOUS | Status: DC
  Filled 2014-09-05: qty 200

## 2014-09-05 MED ORDER — LEVOFLOXACIN 500 MG PO TABS: 500.0000 mg | ORAL_TABLET | Freq: Every day | ORAL | Status: DC

## 2014-09-05 NOTE — Progress Notes (Signed)
ANTIBIOTIC CONSULT NOTE - Follow Up  Pharmacy Consult for Vancomycin, Antibiotic Renal Dose Adjustment Indication: pneumonia  No Known Allergies  Patient Measurements: Height: 6' (182.9 cm) Weight: 157 lb 6.5 oz (71.4 kg) IBW/kg (Calculated) : 77.6   Vital Signs: Temp: 99 F (37.2 C) (10/02 0537) Temp Source: Oral (10/02 0537) BP: 138/72 mmHg (10/02 0537) Pulse Rate: 81 (10/02 0537) Intake/Output from previous day: 10/01 0701 - 10/02 0700 In: 800 [P.O.:800] Out: 2200 [Urine:2000; Stool:200] Intake/Output from this shift:    Labs:  Recent Labs  09/02/14 1817 09/03/14 0355 09/04/14 0505 09/04/14 1426 09/05/14 0515  WBC  --  4.3 5.5  --  6.3  HGB  --  9.0* 8.0* 9.1* 8.9*  PLT  --  204 186  --  204  LABCREA 96.8  --   --   --   --   CREATININE  --  0.55 0.52  --  0.57   Estimated Creatinine Clearance: 81.8 ml/min (by C-G formula based on Cr of 0.57).  Recent Labs  09/05/14 0515  [place] 9.8*     Microbiology: Recent Results (from the past 720 hour(s))  CULTURE, BLOOD (SINGLE)     Status: None   Collection Time    09/02/14  3:53 PM      Result Value Ref Range Status   Specimen Description BLOOD RIGHT ANTECUBITAL   Final   Special Requests BOTTLES DRAWN AEROBIC AND ANAEROBIC 4 ML   Final   Culture  Setup Time     Final   Value: 09/02/2014 22:31     Performed at [facility]   Culture     Final   Value:        BLOOD CULTURE RECEIVED NO GROWTH TO DATE CULTURE WILL BE HELD FOR 5 DAYS BEFORE ISSUING A FINAL NEGATIVE REPORT     Performed at [facility]   Report Status PENDING   Incomplete  MRSA PCR SCREENING     Status: None   Collection Time    09/02/14  4:53 PM      Result Value Ref Range Status   MRSA by PCR NEGATIVE  NEGATIVE Final   Comment:            The GeneXpert MRSA Assay (FDA     approved for NASAL specimens     only), is one component of a     comprehensive MRSA colonization     surveillance program. It is not   intended to diagnose MRSA     infection nor to guide or     monitor treatment for     MRSA infections.  URINE CULTURE     Status: None   Collection Time    09/02/14  6:17 PM      Result Value Ref Range Status   Specimen Description URINE, CLEAN CATCH   Final   Special Requests NONE   Final   Culture  Setup Time     Final   Value: 09/02/2014 22:44     Performed at [facility]     Final   Value: NO GROWTH     Performed at [facility]   Culture     Final   Value: NO GROWTH     Performed at [facility]   Report Status 09/03/2014 FINAL   Final    Medical History: Past Medical History  Diagnosis Date  . Hypertension   . GERD (gastroesophageal reflux disease)   .  Alcohol abuse   . 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 Low sodium   . Cancer     small cell/lung  . Persistent atrial fibrillation 04/11/14    chads2vasc score is 2  . Hypotension   . Atrial fibrillation with RVR   . HCAP (healthcare-associated pneumonia)   . Adrenal hemorrhage   . BPH (benign prostatic hyperplasia)   . Hyponatremia   . Anemia of chronic disease   . Fall   . Alcohol intoxication   . Pulmonary nodules   . Adrenal mass   . Atrial flutter by electrocardiogram   . Neoplastic malignant related fatigue   . 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 Alcoholism in remission     Assessment: 45 yoM admitted with possible sepsis likely 2/2 HCAP.  He is actively undergoing chemotherapy for stage IV NSCLC.  Pharmacy consulted to dose vancomycin and renal adjust other antibiotics ordered by MD.    Goal of Therapy:  Vancomycin trough level 15-20 mcg/ml Doses adjusted per renal function Eradication of infection  10/2: D#4 vancomycin 750mg  q12h (after 1g loading dose) + aztreonam 2g q8h for sepsis possibly secondary to HCAP.   Fever resolved  WBC remains WNL  SCr stable  Vancomycin trough subtherapeutic (9.8)  Blood culture negative to  date; urine culture negative; sputum culture ordered but no results.   Plan:  1.  Increase vancomycin to 1g IV q12h. 2.  Continue aztreonam 2g IV q8h. 3.  Follow SCr, culture results, clinical course.  Clayburn Pert, PharmD, BCPS Pager: [phone] 09/05/2014  7:41 AM

## 2014-09-05 NOTE — Discharge Summary (Signed)
Anthony Curry, 73 y.o., DOB May 16, 1941, MRN [id number]. Admission date: 09/02/2014 Discharge Date 09/05/2014 Primary MD [name], MD Admitting Physician Thurnell Lose, MD  Admission Diagnosis  Alcohol abuse [F10.10] Pleural effusion [J90] Gastroesophageal reflux disease without esophagitis [K21.9] Neoplastic malignant related fatigue [R53.0] HCAP (healthcare-associated pneumonia) [J18.9] Atrial flutter by electrocardiogram [I48.92] Fever, unspecified fever cause [R50.9]  Discharge Diagnosis   Principal Problem:   HCAP (healthcare-associated pneumonia) Active Problems:   Alcohol abuse   Anemia of chronic disease   GERD (gastroesophageal reflux disease)   Pulmonary nodules   Lung cancer   Atrial flutter by electrocardiogram   Atrial fibrillation with RVR   Neoplastic malignant related fatigue   Neutropenic fever   Past Medical History  Diagnosis Date  . Hypertension   . GERD (gastroesophageal reflux disease)   . Alcohol abuse   . 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 Low sodium   . Cancer     small cell/lung  . Persistent atrial fibrillation 04/11/14    chads2vasc score is 2  . Hypotension   . Atrial fibrillation with RVR   . HCAP (healthcare-associated pneumonia)   . Adrenal hemorrhage   . BPH (benign prostatic hyperplasia)   . Hyponatremia   . Anemia of chronic disease   . Fall   . Alcohol intoxication   . Pulmonary nodules   . Adrenal mass   . Atrial flutter by electrocardiogram   . Neoplastic malignant related fatigue   . 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 Alcoholism in remission     Past Surgical History  Procedure Laterality Date  . Hemorrhoid surgery    . Ankle arthrodesis  1995    rt fx-hardware in  . Tonsillectomy    . Colonoscopy    . Excision / curettage bone cyst finger  2005    rt thumb  . Total shoulder arthroplasty  02/03/2012    Procedure: TOTAL SHOULDER ARTHROPLASTY;  Surgeon: [name], MD;   Location: [facility];  Service: Orthopedics;  Laterality: Left;   Brief narrative: Anthony Curry is a 73 y.o. male, with history of stage IV non-small cell lung cancer (T1 A., N0, M1 B.) , past history of alcohol abuse quit one year ago, essential hypertension, BPH, GERD, adrenal mass, atrial fibrillation recently diagnosed, who is under the care of Dr. [name] for his chemotherapy needs and undergoing regular chemotherapy last being one week ago. Comes in with chief complaints of generalized weakness and some confusion ongoing for a couple of days, no headache, no photophobia or neck stiffness, no fevers noted at home. No cough no shortness of breath. No diarrhea or dysuria. No focal weakness.  In the ER workup suggestive of low-grade fever, right-sided pleural effusion on chest x-ray, as well mild UTI patient was diagnosed with possible HCAP , patient was started on broad-spectrum IV antibiotics including vancomycin and has asked them, patient was a febrile, had no leukocytosis during his hospitalization, weakness has improved.  Noticed to have mild hyponatremia, had elevated urine sodium and osmolality, and no serum osmolality, so he was put on fluid restriction for possible SIADH, and recommended to hold Lasix upon discharge.  As well patient had drop in his hemoglobin 10.8 on admission to 8 on 10/1, repeat hemoglobin improved to 9, this was most likely delusional effect, the patient was Hemoccult negative, denies any melena coffee-ground emesis, was resumed on Eliquis on discharge.  Patient had couple episodes of urinary retention where had bladder scan showing around  400 cc, but patient was able to void without any intervention.    Hospital Course See H&P, Labs, Consult and Test reports for all details in brief, patient was admitted for   Principal Problem:   HCAP (healthcare-associated pneumonia) Active Problems:   Alcohol abuse   Anemia of chronic disease   GERD  (gastroesophageal reflux disease)   Pulmonary nodules   Lung cancer   Atrial flutter by electrocardiogram   Atrial fibrillation with RVR   Neoplastic malignant related fatigue   Neutropenic fever  HCAP  -Chest x-ray showing worsening right pleural effusion, recently finished chemotherapy, and febrile101.3 on 9/29, given his immunocompromised status as recently finished IV chemotherapy, was on  with IV azactam and vancomycin ( 09/02/09/2), will be discharged on 5 days of oral levofloxacin at home. - Blood culture showing no growth at date of discharge. UTI  - Patient is on broad-spectrum IV antibiotics, urine culture showing no growth. Anemia  -Patient has significant drop in his hemoglobin from 10.8 on admission to 8 (10/1), he is Hemoccult negative, repeat hemoglobin has been stable, at 9, and 8.9 at date of discharge, resumed back on Eliquis. Hyponatremia  - Mild, asymptomatic, improving with fluid restriction .  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 urinary retention  - Patient had couple episodes of hypertension while in the hospital, but was able to urinate without any intervention with encouragement, the family were instructed to follow as an outpatient, we'll continue with Flomax on discharge. Stage IV non-small cell lung cancer.  -Undergoing chemotherapy, continue to follow as an outpatient. Atrial fibrillation.  -Continue Cardizem, beta blocker . Also on digoxin. Continue with Eliquis on discharge. - Heart rate controled .  GERD  - On PPI . BPH  - Continue Flomax.  Anxiety  - At needed Xanax.        Significant Tests:  See full reports for all details    Dg Chest 2 View  09/02/2014   CLINICAL DATA:  Febrile  EXAM: CHEST  2 VIEW  COMPARISON:  08/12/2014  FINDINGS: Cardiac  shadow is mildly increased in size. Increasing density over the right lung base is noted consistent with recurrent pleural effusion. No focal confluent infiltrate is seen. Multiple rib defects are again identified on the right and stable in appearance. Postoperative changes left shoulder are again seen. Chronic changes in the right shoulder joint are noted.  IMPRESSION: Increasing right-sided pleural effusion.   Electronically Signed   By: Inez Catalina M.D.   On: 09/02/2014 15:28   Dg Chest 2 View  08/12/2014   CLINICAL DATA:  Congested. Cough. History lung cancer and recent pneumonia.  EXAM: CHEST  2 VIEW  COMPARISON:  07/28/2014 and CT of 07/25/2014  FINDINGS: Remote right humeral head/ neck fracture. Left shoulder arthroplasty. Multiple right-sided rib defects either posttraumatic or postsurgical. Midline trachea. Normal heart size. Atherosclerosis in the transverse aorta. Small right pleural effusion, decreased. No pneumothorax. COPD/chronic bronchitis. Improved right base aeration. Minimal left base volume loss and scarring or atelectasis.  IMPRESSION: Decreased right pleural effusion with adjacent decreased airspace disease. Favor atelectasis.  COPD/chronic bronchitis, without new or acute superimposed finding.  Nonacute bilateral rib fractures with remote proximal right humerus Trauma.   Electronically Signed   By: Abigail Miyamoto M.D.   On: 08/12/2014  14:07   Ct Head Wo Contrast  09/02/2014   CLINICAL DATA:  Confusion, altered mental status  EXAM: CT HEAD WITHOUT CONTRAST  TECHNIQUE: Contiguous axial images were obtained from the base of the skull through the vertex without contrast.  COMPARISON:  07/03/2013  FINDINGS: Diffuse brain atrophy noted with chronic white matter microvascular ischemic changes about the ventricles. No acute intracranial hemorrhage, mass infarction, mass lesion, midline shift, herniation, hydrocephalus, or extra-axial fluid collection. Normal gray-white matter differentiation.  Cisterns are patent. No cerebellar abnormality. Mastoids and sinuses are clear.  IMPRESSION: Stable atrophy and chronic white matter ischemic changes. No interval change or acute process by noncontrast CT.   Electronically Signed   By: Daryll Brod M.D.   On: 09/02/2014 14:54     Today   Subjective:   Anthony Curry today has no headache,no chest abdominal pain,no new weakness tingling or numbness, feels much better wants to go home today.   Objective:   Blood pressure 130/58, pulse 73, temperature 99 F (37.2 C), temperature source Oral, resp. rate 20, height 6' (1.829 m), weight 71.4 kg (157 lb 6.5 oz), SpO2 96.00%.  Intake/Output Summary (Last 24 hours) at 09/05/14 1024 Last data filed at 09/05/14 1018  Gross per 24 hour  Intake    360 ml  Output   1550 ml  Net  -1190 ml    Exam Awake Alert, Oriented *3, No new F.N deficits, Normal affect [place].AT,PERRAL Supple Neck,No JVD, No cervical lymphadenopathy appriciated.  Symmetrical Chest wall movement, Good air movement bilaterally, CTAB RRR,No Gallops,Rubs or new Murmurs, No Parasternal Heave +ve B.Sounds, Abd Soft, Non tender, No organomegaly appriciated, No rebound -guarding or rigidity. No Cyanosis, Clubbing or edema, No new Rash or bruise  Data Review   Cultures -  Results for orders placed during the hospital encounter of 09/02/14  CULTURE, BLOOD (SINGLE)     Status: None   Collection Time    09/02/14  3:53 PM      Result Value Ref Range Status   Specimen Description BLOOD RIGHT ANTECUBITAL   Final   Special Requests BOTTLES DRAWN AEROBIC AND ANAEROBIC 4 ML   Final   Culture  Setup Time     Final   Value: 09/02/2014 22:31     Performed at [facility]   Culture     Final   Value:        BLOOD CULTURE RECEIVED NO GROWTH TO DATE CULTURE WILL BE HELD FOR 5 DAYS BEFORE ISSUING A FINAL NEGATIVE REPORT     Performed at [facility]   Report Status PENDING   Incomplete  MRSA PCR SCREENING     Status: None    Collection Time    09/02/14  4:53 PM      Result Value Ref Range Status   MRSA by PCR NEGATIVE  NEGATIVE Final   Comment:            The GeneXpert MRSA Assay (FDA     approved for NASAL specimens     only), is one component of a     comprehensive MRSA colonization     surveillance program. It is not     intended to diagnose MRSA     infection nor to guide or     monitor treatment for     MRSA infections.  URINE CULTURE     Status: None   Collection Time    09/02/14  6:17 PM      Result  Value Ref Range Status   Specimen Description URINE, CLEAN CATCH   Final   Special Requests NONE   Final   Culture  Setup Time     Final   Value: 09/02/2014 22:44     Performed at [facility]     Final   Value: NO GROWTH     Performed at [facility]   Culture     Final   Value: NO GROWTH     Performed at [facility]   Report Status 09/03/2014 FINAL   Final     CBC w Diff: Lab Results  Component Value Date   WBC 6.3 09/05/2014   WBC 6.3 08/27/2014   HGB 8.9* 09/05/2014   HGB 11.3* 08/27/2014   HCT 25.3* 09/05/2014   HCT 33.8* 08/27/2014   PLT 204 09/05/2014   PLT 485* 08/27/2014   LYMPHOPCT 19 09/02/2014   LYMPHOPCT 20.0 08/27/2014   MONOPCT 10 09/02/2014   MONOPCT 12.6 08/27/2014   EOSPCT 2 09/02/2014   EOSPCT 6.4 08/27/2014   BASOPCT 0 09/02/2014   BASOPCT 0.9 08/27/2014   CMP: Lab Results  Component Value Date   NA 127* 09/05/2014   NA 133* 08/27/2014   K 4.4 09/05/2014   K 3.5 08/27/2014   CL 89* 09/05/2014   CO2 28 09/05/2014   CO2 31* 08/27/2014   BUN 12 09/05/2014   BUN 9.9 08/27/2014   CREATININE 0.57 09/05/2014   CREATININE 0.8 08/27/2014   PROT 6.6 09/02/2014   PROT 5.9* 08/27/2014   ALBUMIN 2.7* 09/02/2014   ALBUMIN 3.0* 08/27/2014   BILITOT 0.8 09/02/2014   BILITOT 0.54 08/27/2014   ALKPHOS 100 09/02/2014   ALKPHOS 115 08/27/2014   AST 25 09/02/2014   AST 23 08/27/2014   ALT 15 09/02/2014   ALT 11 08/27/2014  .  Micro Results Recent Results  (from the past 240 hour(s))  CULTURE, BLOOD (SINGLE)     Status: None   Collection Time    09/02/14  3:53 PM      Result Value Ref Range Status   Specimen Description BLOOD RIGHT ANTECUBITAL   Final   Special Requests BOTTLES DRAWN AEROBIC AND ANAEROBIC 4 ML   Final   Culture  Setup Time     Final   Value: 09/02/2014 22:31     Performed at [facility]   Culture     Final   Value:        BLOOD CULTURE RECEIVED NO GROWTH TO DATE CULTURE WILL BE HELD FOR 5 DAYS BEFORE ISSUING A FINAL NEGATIVE REPORT     Performed at [facility]   Report Status PENDING   Incomplete  MRSA PCR SCREENING     Status: None   Collection Time    09/02/14  4:53 PM      Result Value Ref Range Status   MRSA by PCR NEGATIVE  NEGATIVE Final   Comment:            The GeneXpert MRSA Assay (FDA     approved for NASAL specimens     only), is one component of a     comprehensive MRSA colonization     surveillance program. It is not     intended to diagnose MRSA     infection nor to guide or     monitor treatment for     MRSA infections.  URINE CULTURE     Status: None  Collection Time    09/02/14  6:17 PM      Result Value Ref Range Status   Specimen Description URINE, CLEAN CATCH   Final   Special Requests NONE   Final   Culture  Setup Time     Final   Value: 09/02/2014 22:44     Performed at [facility]     Final   Value: NO GROWTH     Performed at [facility]   Culture     Final   Value: NO GROWTH     Performed at [facility]   Report Status 09/03/2014 FINAL   Final     Discharge Instructions     Please follow with your PCP in 5 days to recheck chest x-ray, CBC, BMP. Continue with fluid restriction 1200 cc per day. Follow-up Information   Follow up with [name], MD In 5 days.   Specialty:  Family Medicine   Contact information:   [place] [id number] [phone]       Discharge  Medications     Medication List    STOP taking these medications       furosemide 40 MG tablet  Commonly known as:  LASIX      TAKE these medications       Acidophilus Caps capsule  Take 1 capsule by mouth daily.     ALPRAZolam 0.25 MG tablet  Commonly known as:  XANAX  Take 0.25 mg by mouth 3 (three) times daily as needed for anxiety or sleep.     apixaban 5 MG Tabs tablet  Commonly known as:  ELIQUIS  Take 1 tablet (5 mg total) by mouth 2 (two) times daily.     Biotin 7500 MCG Tabs  Take 1 tablet by mouth daily.     calcium carbonate 600 MG Tabs tablet  Commonly known as:  OS-CAL  Take 600 mg by mouth daily with breakfast.     demeclocycline 150 MG tablet  Commonly known as:  DECLOMYCIN  Take 150 mg by mouth 2 (two) times daily.     dexamethasone 4 MG tablet  Commonly known as:  DECADRON  Take 4 mg by mouth See admin instructions. Take 1 tablet twice a day the day before, day of, and day after chemotherapy every three weeks.     digoxin 0.125 MG tablet  Commonly known as:  LANOXIN  Take 1 tablet (0.125 mg total) by mouth daily.     diltiazem 360 MG 24 hr capsule  Commonly known as:  CARDIZEM CD  Take 1 capsule (360 mg total) by mouth daily at 12 noon.     ENSURE COMPLETE PO  Take 1 Can by mouth 2 (two) times daily.     FISH OIL PO  Take 1 tablet by mouth daily.     HYDROcodone-acetaminophen 5-325 MG per tablet  Commonly known as:  NORCO/VICODIN  Take 1-2 tablets by mouth every 4 (four) hours as needed (pain).     levofloxacin 500 MG tablet  Commonly known as:  LEVAQUIN  Take 1 tablet (500 mg total) by mouth daily.     MAGNESIUM PO  Take 400 mg by mouth daily.     metoprolol tartrate 25 MG tablet  Commonly known as:  LOPRESSOR  Take 1 tablet (25 mg total) by mouth 2 (two) times daily.     multivitamin with minerals Tabs tablet  Take 1 tablet by  mouth daily.     omeprazole 20 MG capsule  Commonly known as:  PRILOSEC  Take 20 mg by mouth daily  as needed (heartburn).     Potassium 99 MG Tabs  Take 1 tablet by mouth daily.     PRESCRIPTION MEDICATION  Chemo at [place]  Take 1 each by mouth daily.     tamsulosin 0.4 MG Caps capsule  Commonly known as:  FLOMAX  Take 0.4 mg by mouth daily.     thiamine 100 MG tablet  Commonly known as:  VITAMIN B-1  Take 1 tablet (100 mg total) by mouth daily.     VITAMIN C PO  Take 1,000 mg by mouth daily.     vitamin E 400 UNIT capsule  Take 400 Units by mouth daily.         Total Time in preparing paper work, data evaluation and todays exam - 35 minutes  Lorence Nagengast M.D on 09/05/2014 at 10:24 AM  [facility] Office  [phone]

## 2014-09-05 NOTE — Discharge Instructions (Signed)
Follow with Primary MD [name], MD in 5 days   Get CBC, CMP, 2 view Chest X ray checked  by Primary MD next visit.    Activity: As tolerated with Full fall precautions use walker/cane & assistance as needed   Disposition Home    Diet: Heart Healthy  , with feeding assistance and aspiration precautions as needed. With fluid restriction 1200 cc per day.  For Heart failure patients - Check your Weight same time everyday, if you gain over 2 pounds, or you develop in leg swelling, experience more shortness of breath or chest pain, call your Primary MD immediately. Follow Cardiac Low Salt Diet and 1.8 lit/day fluid restriction.   On your next visit with her primary care physician please Get Medicines reviewed and adjusted.  Please request your Prim.MD to go over all Hospital Tests and Procedure/Radiological results at the follow up, please get all Hospital records sent to your Prim MD by signing hospital release before you go home.   If you experience worsening of your admission symptoms, develop shortness of breath, life threatening emergency, suicidal or homicidal thoughts you must seek medical attention immediately by calling 911 or calling your MD immediately  if symptoms less severe.  You Must read complete instructions/literature along with all the possible adverse reactions/side effects for all the Medicines you take and that have been prescribed to you. Take any new Medicines after you have completely understood and accpet all the possible adverse reactions/side effects.   Do not drive, operating heavy machinery, perform activities at heights, swimming or participation in water activities or provide baby sitting services if your were admitted for syncope or siezures until you have seen by Primary MD or a Neurologist and advised to do so again.  Do not drive when taking Pain medications.    Do not take more than prescribed Pain, Sleep and Anxiety Medications  Special  Instructions: If you have smoked or chewed Tobacco  in the last 2 yrs please stop smoking, stop any regular Alcohol  and or any Recreational drug use.  Wear Seat belts while driving.   Please note  You were cared for by a hospitalist during your hospital stay. If you have any questions about your discharge medications or the care you received while you were in the hospital after you are discharged, you can call the unit and asked to speak with the hospitalist on call if the hospitalist that took care of you is not available. Once you are discharged, your primary care physician will handle any further medical issues. Please note that NO REFILLS for any discharge medications will be authorized once you are discharged, as it is imperative that you return to your primary care physician (or establish a relationship with a primary care physician if you do not have one) for your aftercare needs so that they can reassess your need for medications and monitor your lab values.

## 2014-09-08 LAB — CULTURE, BLOOD (SINGLE): Culture: NO GROWTH

## 2014-09-10 ENCOUNTER — Telehealth: Payer: Self-pay | Admitting: *Deleted

## 2014-09-10 NOTE — Telephone Encounter (Signed)
Pt's wife called left a msg wanting to know when pt is to f/u with [facility] after his hospitalization.  Per Dr Vista Mink, keep f/u appt as scheduled on 10/14.  Called and spoke to pt, he verbalized understanding regarding keeping 10/14 appts, confirmed appt times.

## 2014-09-11 ENCOUNTER — Other Ambulatory Visit: Payer: Self-pay | Admitting: Physician Assistant

## 2014-09-11 ENCOUNTER — Other Ambulatory Visit: Payer: Self-pay | Admitting: *Deleted

## 2014-09-11 ENCOUNTER — Other Ambulatory Visit: Payer: Self-pay

## 2014-09-11 DIAGNOSIS — C3490 Malignant neoplasm of unspecified part of unspecified bronchus or lung: Secondary | ICD-10-CM

## 2014-09-11 DIAGNOSIS — M7989 Other specified soft tissue disorders: Secondary | ICD-10-CM

## 2014-09-11 MED ORDER — MIRTAZAPINE 15 MG PO TABS: 15.0000 mg | ORAL_TABLET | Freq: Every day | ORAL | Status: DC

## 2014-09-15 DIAGNOSIS — I481 Persistent atrial fibrillation: Secondary | ICD-10-CM | POA: Diagnosis not present

## 2014-09-15 DIAGNOSIS — R600 Localized edema: Secondary | ICD-10-CM | POA: Diagnosis not present

## 2014-09-15 DIAGNOSIS — J189 Pneumonia, unspecified organism: Secondary | ICD-10-CM | POA: Diagnosis not present

## 2014-09-17 ENCOUNTER — Telehealth: Payer: Self-pay | Admitting: Internal Medicine

## 2014-09-17 ENCOUNTER — Encounter: Payer: Self-pay | Admitting: Internal Medicine

## 2014-09-17 ENCOUNTER — Ambulatory Visit: Payer: Medicare Other

## 2014-09-17 ENCOUNTER — Other Ambulatory Visit (HOSPITAL_BASED_OUTPATIENT_CLINIC_OR_DEPARTMENT_OTHER): Payer: Medicare Other

## 2014-09-17 ENCOUNTER — Ambulatory Visit (HOSPITAL_BASED_OUTPATIENT_CLINIC_OR_DEPARTMENT_OTHER): Payer: Medicare Other | Admitting: Internal Medicine

## 2014-09-17 VITALS — BP 136/77 | HR 75 | Resp 18 | Ht 72.0 in | Wt 157.7 lb

## 2014-09-17 DIAGNOSIS — C3410 Malignant neoplasm of upper lobe, unspecified bronchus or lung: Secondary | ICD-10-CM

## 2014-09-17 DIAGNOSIS — C3431 Malignant neoplasm of lower lobe, right bronchus or lung: Secondary | ICD-10-CM | POA: Insufficient documentation

## 2014-09-17 DIAGNOSIS — C7970 Secondary malignant neoplasm of unspecified adrenal gland: Secondary | ICD-10-CM

## 2014-09-17 DIAGNOSIS — J90 Pleural effusion, not elsewhere classified: Secondary | ICD-10-CM | POA: Diagnosis not present

## 2014-09-17 DIAGNOSIS — I4891 Unspecified atrial fibrillation: Secondary | ICD-10-CM

## 2014-09-17 DIAGNOSIS — C3490 Malignant neoplasm of unspecified part of unspecified bronchus or lung: Secondary | ICD-10-CM

## 2014-09-17 LAB — COMPREHENSIVE METABOLIC PANEL (CC13)
ALK PHOS: 104 U/L (ref 40–150)
ALT: 21 U/L (ref 0–55)
ANION GAP: 13 meq/L — AB (ref 3–11)
AST: 21 U/L (ref 5–34)
Albumin: 3.1 g/dL — ABNORMAL LOW (ref 3.5–5.0)
BILIRUBIN TOTAL: 0.39 mg/dL (ref 0.20–1.20)
BUN: 22.9 mg/dL (ref 7.0–26.0)
CO2: 23 meq/L (ref 22–29)
CREATININE: 0.8 mg/dL (ref 0.7–1.3)
Calcium: 9 mg/dL (ref 8.4–10.4)
Chloride: 97 mEq/L — ABNORMAL LOW (ref 98–109)
Glucose: 194 mg/dl — ABNORMAL HIGH (ref 70–140)
Potassium: 3.5 mEq/L (ref 3.5–5.1)
Sodium: 133 mEq/L — ABNORMAL LOW (ref 136–145)
Total Protein: 6.2 g/dL — ABNORMAL LOW (ref 6.4–8.3)

## 2014-09-17 LAB — CBC WITH DIFFERENTIAL/PLATELET
BASO%: 0.4 % (ref 0.0–2.0)
Basophils Absolute: 0 10*3/uL (ref 0.0–0.1)
EOS%: 0 % (ref 0.0–7.0)
Eosinophils Absolute: 0 10*3/uL (ref 0.0–0.5)
HCT: 32.5 % — ABNORMAL LOW (ref 38.4–49.9)
HGB: 10.5 g/dL — ABNORMAL LOW (ref 13.0–17.1)
LYMPH%: 7.4 % — AB (ref 14.0–49.0)
MCH: 33.3 pg (ref 27.2–33.4)
MCHC: 32.5 g/dL (ref 32.0–36.0)
MCV: 102.5 fL — AB (ref 79.3–98.0)
MONO#: 0.1 10*3/uL (ref 0.1–0.9)
MONO%: 1.2 % (ref 0.0–14.0)
NEUT#: 6.9 10*3/uL — ABNORMAL HIGH (ref 1.5–6.5)
NEUT%: 91 % — ABNORMAL HIGH (ref 39.0–75.0)
PLATELETS: 559 10*3/uL — AB (ref 140–400)
RBC: 3.17 10*6/uL — ABNORMAL LOW (ref 4.20–5.82)
RDW: 16.2 % — ABNORMAL HIGH (ref 11.0–14.6)
WBC: 7.5 10*3/uL (ref 4.0–10.3)
lymph#: 0.6 10*3/uL — ABNORMAL LOW (ref 0.9–3.3)

## 2014-09-17 MED ORDER — METHYLPREDNISOLONE (PAK) 4 MG PO TABS: ORAL_TABLET | ORAL | Status: DC

## 2014-09-17 NOTE — Progress Notes (Signed)
[facility] Telephone:([phone]) [phone]   Fax:([phone]) [place], [place] [address]  DIAGNOSIS: Stage IV (T1 A., N0, M1 B.) non-small cell lung cancer, adenocarcinoma with negative EGFR mutation and negative ALK gene translocation diagnosed in October of 2014.   Molecular profile: Negative for RET, ALK, BRAF, MET, EGFR.  Positive for ERBB2 A775T, [id number], KRAS G13E, MAP2K1 D67N (see full report).   PRIOR THERAPY:  1) Systemic chemotherapy with carboplatin for AUC of 5 and Alimta 500 mg/M2 every 3 weeks, status post 6 cycles with stable disease. First dose on 10/23/2013. 2) Maintenance systemic chemotherapy with single agent Alimta 500 mg/M2 every 3 weeks. Status post 8 cycles.    CURRENT THERAPY: Observation.  CHEMOTHERAPY INTENT: Palliative  CURRENT # OF CHEMOTHERAPY CYCLES: 8 CURRENT ANTIEMETICS: Zofran, Compazine and dexamethasone  CURRENT SMOKING STATUS: Former smoker  ORAL CHEMOTHERAPY AND CONSENT: None  CURRENT BISPHOSPHONATES USE: None  PAIN MANAGEMENT: 0/10  NARCOTICS INDUCED CONSTIPATION: None  LIVING WILL AND CODE STATUS: ?   INTERVAL HISTORY: Anthony Curry 73 y.o. male returns to the clinic today for followup visit accompanied by his wife. The patient has been complaining of increasing fatigue and weakness over the last 2-3 months. That was getting worse and he was admitted to [facility] on 09/02/2014 with our by his weakness and confusion . Chest x-ray at that time showed right-sided pleural effusion as well as questionable healthcare acquired pneumonia. He was treated with IV Azactam and vancomycin followed by the Levaquin. He is feeling a little bit better today except for the fatigue. He denied having any significant chest pain, shortness of breath, cough or hemoptysis. He has no significant weight loss or night sweats. He is here today for evaluation and discussion  of his treatment options.  MEDICAL HISTORY: Past Medical History  Diagnosis Date  . Hypertension   . GERD (gastroesophageal reflux disease)   . Alcohol abuse   . 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 Low sodium   . Cancer     small cell/lung  . Persistent atrial fibrillation 04/11/14    chads2vasc score is 2  . Hypotension   . Atrial fibrillation with RVR   . HCAP (healthcare-associated pneumonia)   . Adrenal hemorrhage   . BPH (benign prostatic hyperplasia)   . Hyponatremia   . Anemia of chronic disease   . Fall   . Alcohol intoxication   . Pulmonary nodules   . Adrenal mass   . Atrial flutter by electrocardiogram   . Neoplastic malignant related fatigue   . 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 Alcoholism in remission     ALLERGIES:  has No Known Allergies.  MEDICATIONS:  Current Outpatient Prescriptions  Medication Sig Dispense Refill  . ALPRAZolam (XANAX) 0.25 MG tablet Take 0.25 mg by mouth 3 (three) times daily as needed for anxiety or sleep.      [name] apixaban (ELIQUIS) 5 MG TABS tablet Take 1 tablet (5 mg total) by mouth 2 (two) times daily.  180 tablet  1  . Ascorbic Acid (VITAMIN C PO) Take 1,000 mg by mouth daily.       . B Complex-C (SUPER B COMPLEX PO) Take 1 each by mouth daily.       . Biotin 7500 MCG TABS Take 1 tablet by mouth daily.       . calcium carbonate (OS-CAL) 600 MG TABS Take  600 mg by mouth daily with breakfast.      . demeclocycline (DECLOMYCIN) 150 MG tablet Take 150 mg by mouth 2 (two) times daily.      [name] dexamethasone (DECADRON) 4 MG tablet Take 4 mg by mouth See admin instructions. Take 1 tablet twice a day the day before, day of, and day after chemotherapy every three weeks.      . digoxin (LANOXIN) 0.125 MG tablet Take 1 tablet (0.125 mg total) by mouth daily.  90 tablet  1  . diltiazem (CARDIZEM CD) 360 MG 24 hr capsule Take 1 capsule (360 mg total) by mouth daily at 12 noon.  90 capsule  3  . HYDROcodone-acetaminophen  (NORCO/VICODIN) 5-325 MG per tablet Take 1-2 tablets by mouth every 4 (four) hours as needed (pain).      . Lactobacillus (ACIDOPHILUS) CAPS capsule Take 1 capsule by mouth daily.      [name] MAGNESIUM PO Take 400 mg by mouth daily.       . metoprolol tartrate (LOPRESSOR) 25 MG tablet Take 1 tablet (25 mg total) by mouth 2 (two) times daily.  180 tablet  1  . Multiple Vitamin (MULTIVITAMIN WITH MINERALS) TABS Take 1 tablet by mouth daily.      . Nutritional Supplements (ENSURE COMPLETE PO) Take 1 Can by mouth 2 (two) times daily.      . Omega-3 Fatty Acids (FISH OIL PO) Take 1 tablet by mouth daily.      . Potassium 99 MG TABS Take 1 tablet by mouth daily.      [name] PRESCRIPTION MEDICATION Chemo at [facility]      . tamsulosin (FLOMAX) 0.4 MG CAPS capsule Take 0.4 mg by mouth daily.       [name] thiamine (VITAMIN B-1) 100 MG tablet Take 1 tablet (100 mg total) by mouth daily.  30 tablet  6  . vitamin E 400 UNIT capsule Take 400 Units by mouth daily.      [name] levofloxacin (LEVAQUIN) 500 MG tablet Take 1 tablet (500 mg total) by mouth daily.  5 tablet  0  . mirtazapine (REMERON) 15 MG tablet Take 1 tablet (15 mg total) by mouth at bedtime.  30 tablet  0  . omeprazole (PRILOSEC) 20 MG capsule Take 20 mg by mouth daily as needed (heartburn).        No current facility-administered medications for this visit.    SURGICAL HISTORY:  Past Surgical History  Procedure Laterality Date  . Hemorrhoid surgery    . Ankle arthrodesis  1995    rt fx-hardware in  . Tonsillectomy    . Colonoscopy    . Excision / curettage bone cyst finger  2005    rt thumb  . Total shoulder arthroplasty  02/03/2012    Procedure: TOTAL SHOULDER ARTHROPLASTY;  Surgeon: [name], MD;  Location: [facility];  Service: Orthopedics;  Laterality: Left;    REVIEW OF SYSTEMS:  Constitutional: positive for fatigue Eyes: negative Ears, nose, mouth, throat, and face: negative Respiratory: negative Cardiovascular:  negative Gastrointestinal: negative Genitourinary:negative Integument/breast: negative Hematologic/lymphatic: negative Musculoskeletal:negative Neurological: negative Behavioral/Psych: negative Endocrine: negative Allergic/Immunologic: negative   PHYSICAL EXAMINATION: General appearance: alert, cooperative, fatigued and no distress Head: Normocephalic, without obvious abnormality, atraumatic Neck: no adenopathy, no JVD, supple, symmetrical, trachea midline and thyroid not enlarged, symmetric, no tenderness/mass/nodules Lymph nodes: Cervical, supraclavicular, and axillary nodes normal. Resp: clear to auscultation bilaterally Back: symmetric, no curvature. ROM normal. No CVA tenderness. Cardio: regular rate and  rhythm, S1, S2 normal, no murmur, click, rub or gallop GI: soft, non-tender; bowel sounds normal; no masses,  no organomegaly Extremities: extremities normal, atraumatic, no cyanosis or edema Neurologic: Alert and oriented X 3, normal strength and tone. Normal symmetric reflexes. Normal coordination and gait  ECOG PERFORMANCE STATUS: 1 - Symptomatic but completely ambulatory  Blood pressure 136/77, pulse 75, resp. rate 18, height 6' (1.829 m), weight 157 lb 11.2 oz (71.532 kg), SpO2 100.00%.  LABORATORY DATA: Lab Results  Component Value Date   WBC 7.5 09/17/2014   HGB 10.5* 09/17/2014   HCT 32.5* 09/17/2014   MCV 102.5* 09/17/2014   PLT 559* 09/17/2014      Chemistry      Component Value Date/Time   NA 127* 09/05/2014 0515   NA 133* 08/27/2014 1154   K 4.4 09/05/2014 0515   K 3.5 08/27/2014 1154   CL 89* 09/05/2014 0515   CO2 28 09/05/2014 0515   CO2 31* 08/27/2014 1154   BUN 12 09/05/2014 0515   BUN 9.9 08/27/2014 1154   CREATININE 0.57 09/05/2014 0515   CREATININE 0.8 08/27/2014 1154      Component Value Date/Time   CALCIUM 8.7 09/05/2014 0515   CALCIUM 9.5 08/27/2014 1154   ALKPHOS 100 09/02/2014 1400   ALKPHOS 115 08/27/2014 1154   AST 25 09/02/2014 1400   AST 23  08/27/2014 1154   ALT 15 09/02/2014 1400   ALT 11 08/27/2014 1154   BILITOT 0.8 09/02/2014 1400   BILITOT 0.54 08/27/2014 1154       RADIOGRAPHIC STUDIES:  ASSESSMENT AND PLAN: This is a very pleasant 73 years old white male with:  1)  stage IV non-small cell lung cancer, adenocarcinoma status post induction chemotherapy with carboplatin and Alimta and currently on maintenance treatment with single agent Alimta status post 8 cycles. He tolerated the last cycle of his treatment fairly well with no significant adverse effects except for the persistent fatigue and weakness and he was admitted to the hospital at least 2 times recently with questionable pneumonia. I have a lengthy discussion with the patient and his wife today about his current disease status and treatment options. I strongly recommended for the patient to consider discontinuing his maintenance chemotherapy at this point because of the significant fatigue and weakness as well as the poor quality of life on treatment. I will continue to monitor him closely with repeat CT scan of the chest, abdomen and pelvis in 2 months. The patient and his wife agreed to the current plan.  2) right pleural effusion: Will continue to monitor and drain the fluid as needed  3) atrial fibrillation: He'll continue his current medication with Cardizem, digoxin and metoprolol in addition to Eliquis. He'll also continue his routine followup visit with his cardiologist Dr. [name].  He was advised to call immediately if he has any concerning symptoms in the interval. The patient voices understanding of current disease status and treatment options and is in agreement with the current care plan.  All questions were answered. The patient knows to call the clinic with any problems, questions or concerns. We can certainly see the patient much sooner if necessary.  Disclaimer: This note was dictated with voice recognition software. Similar sounding words can  inadvertently be transcribed and may not be corrected upon review.

## 2014-09-17 NOTE — Telephone Encounter (Signed)
Pt confirmed labs/ov per 10/14 POF, gave pt AVS.... KJ °

## 2014-09-17 NOTE — Telephone Encounter (Signed)
Pt needs later in the year will be out town.....  R/s

## 2014-09-19 ENCOUNTER — Other Ambulatory Visit: Payer: Self-pay

## 2014-09-22 ENCOUNTER — Ambulatory Visit (INDEPENDENT_AMBULATORY_CARE_PROVIDER_SITE_OTHER): Payer: Medicare Other | Admitting: Physician Assistant

## 2014-09-22 ENCOUNTER — Encounter: Payer: Self-pay | Admitting: *Deleted

## 2014-09-22 ENCOUNTER — Encounter: Payer: Self-pay | Admitting: Physician Assistant

## 2014-09-22 VITALS — BP 117/70 | HR 97 | Ht 72.0 in | Wt 150.0 lb

## 2014-09-22 DIAGNOSIS — I119 Hypertensive heart disease without heart failure: Secondary | ICD-10-CM

## 2014-09-22 DIAGNOSIS — I481 Persistent atrial fibrillation: Secondary | ICD-10-CM | POA: Diagnosis not present

## 2014-09-22 DIAGNOSIS — C3431 Malignant neoplasm of lower lobe, right bronchus or lung: Secondary | ICD-10-CM | POA: Diagnosis not present

## 2014-09-22 DIAGNOSIS — I4819 Other persistent atrial fibrillation: Secondary | ICD-10-CM

## 2014-09-22 DIAGNOSIS — I4891 Unspecified atrial fibrillation: Secondary | ICD-10-CM

## 2014-09-22 LAB — BASIC METABOLIC PANEL
BUN: 21 mg/dL (ref 6–23)
CALCIUM: 8.9 mg/dL (ref 8.4–10.5)
CO2: 30 mEq/L (ref 19–32)
Chloride: 91 mEq/L — ABNORMAL LOW (ref 96–112)
Creatinine, Ser: 0.8 mg/dL (ref 0.4–1.5)
GFR: 100.17 mL/min (ref >60.00)
Glucose, Bld: 71 mg/dL (ref 70–99)
Potassium: 3 mEq/L — ABNORMAL LOW (ref 3.5–5.1)
Sodium: 135 mEq/L (ref 135–145)

## 2014-09-22 NOTE — Progress Notes (Signed)
Cardiology Office Note   Date:  09/22/2014   ID:  Anthony Curry, DOB [birth year], MRN [id number]  PCP:  [name], MD  Cardiologist:  Dr. Thompson Grayer     History of Present Illness: Anthony Curry is a 73 y.o. male with a hx of persistent AFib, stage IV lung CA, HTN, hyponatremia, prior ETOH abuse.  He is undergoing chemotherapy for treatment of his CA.  AFib was first dx after a trip to the ED in 04/2014.  Initially seen by Dr. Thompson Grayer in 07/2014 for AFib with RVR during an admission for CAP and possible sepsis.  He was placed on rate control Rx and Eliquis was started for [facility].  He was not seen back until 09/01/14 by Dr. [name] and noted to have never started the Eliquis.  It was recommended that he start taking Eliquis and plan DCCV after 3-4 weeks of uninterrupted anticoagulation.  He was admitted 9/29-10/2 with fever and R sided pleural effusion.  He was treated for possible HCAP.  Of note, his Hgb dropped and his Eliquis was held.  However, workup was negative and the DC notes indicate that the Eliquis was resumed on DC.  He returns for FU.  He is doing well.  He has occ palpitations.  He does feel fatigued.  Denies syncope.  His breathing is better.   No chest pain.  No orthopnea, PND, significant edema.     Studies:  - Echo (8/15):  Mild LVH, EF 55-60%, mod LAE, mild RAE, small effusion   Recent Labs/Images:  Recent Labs  07/26/14 0613 07/27/14 0347  09/17/14 0918 09/17/14 0925  NA 131* 128*  < >  --  133*  K 3.7 4.0  < >  --  3.5  BUN 7 5*  < >  --  22.9  CREATININE 0.56 0.59  < >  --  0.8  ALT 15  --   < >  --  21  HGB 8.8* 9.6*  < > 10.5*  --   TSH 1.700  --   --   --   --   PROBNP  --  1683.0*  --   --   --   < > = values in this interval not displayed.      Wt Readings from Last 3 Encounters:  09/22/14 150 lb (68.04 kg)  09/17/14 157 lb 11.2 oz (71.532 kg)  09/05/14 157 lb 6.5 oz (71.4 kg)     Past Medical History  Diagnosis Date  .  Hypertension   . GERD (gastroesophageal reflux disease)   . Alcohol abuse   . 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 Low sodium   . Cancer     small cell/lung  . Persistent atrial fibrillation 04/11/14    chads2vasc score is 2  . Hypotension   . Atrial fibrillation with RVR   . HCAP (healthcare-associated pneumonia)   . Adrenal hemorrhage   . BPH (benign prostatic hyperplasia)   . Hyponatremia   . Anemia of chronic disease   . Fall   . Alcohol intoxication   . Pulmonary nodules   . Adrenal mass   . Atrial flutter by electrocardiogram   . Neoplastic malignant related fatigue   . 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 Alcoholism in remission     Current Outpatient Prescriptions  Medication Sig Dispense Refill  . ALPRAZolam (XANAX) 0.25 MG tablet Take 0.25 mg by mouth 3 (three) times daily as needed for anxiety or sleep.      [name]  apixaban (ELIQUIS) 5 MG TABS tablet Take 1 tablet (5 mg total) by mouth 2 (two) times daily.  180 tablet  1  . Ascorbic Acid (VITAMIN C PO) Take 1,000 mg by mouth daily.       . B Complex-C (SUPER B COMPLEX PO) Take 1 each by mouth daily.       . Biotin 7500 MCG TABS Take 1 tablet by mouth daily.       . calcium carbonate (OS-CAL) 600 MG TABS Take 600 mg by mouth daily with breakfast.      . demeclocycline (DECLOMYCIN) 150 MG tablet Take 150 mg by mouth 2 (two) times daily.      [name] dexamethasone (DECADRON) 4 MG tablet Take 4 mg by mouth See admin instructions. Take 1 tablet twice a day the day before, day of, and day after chemotherapy every three weeks.      . digoxin (LANOXIN) 0.125 MG tablet Take 1 tablet (0.125 mg total) by mouth daily.  90 tablet  1  . diltiazem (CARDIZEM CD) 360 MG 24 hr capsule Take 1 capsule (360 mg total) by mouth daily at 12 noon.  90 capsule  3  . furosemide (LASIX) 40 MG tablet Take 40 mg by mouth 2 (two) times daily.       [name] HYDROcodone-acetaminophen (NORCO/VICODIN) 5-325 MG per tablet Take 1-2 tablets by mouth  every 4 (four) hours as needed (pain).      . Lactobacillus (ACIDOPHILUS) CAPS capsule Take 1 capsule by mouth daily.      [name] MAGNESIUM PO Take 400 mg by mouth daily.       . methylPREDNIsolone (MEDROL DOSPACK) 4 MG tablet Take 4 mg by mouth as directed. follow package directions      . metoprolol tartrate (LOPRESSOR) 25 MG tablet Take 1 tablet (25 mg total) by mouth 2 (two) times daily.  180 tablet  1  . mirtazapine (REMERON) 15 MG tablet Take 1 tablet (15 mg total) by mouth at bedtime.  30 tablet  0  . Multiple Vitamin (MULTIVITAMIN WITH MINERALS) TABS Take 1 tablet by mouth daily.      . Nutritional Supplements (ENSURE COMPLETE PO) Take 1 Can by mouth 2 (two) times daily.      . Omega-3 Fatty Acids (FISH OIL PO) Take 1 tablet by mouth daily.      [name] omeprazole (PRILOSEC) 20 MG capsule Take 20 mg by mouth daily as needed (heartburn).       . Potassium 99 MG TABS Take 1 tablet by mouth daily.      [name] PRESCRIPTION MEDICATION Chemo at [facility]      . tamsulosin (FLOMAX) 0.4 MG CAPS capsule Take 0.4 mg by mouth daily.       [name] thiamine (VITAMIN B-1) 100 MG tablet Take 1 tablet (100 mg total) by mouth daily.  30 tablet  6  . vitamin E 400 UNIT capsule Take 400 Units by mouth daily.       No current facility-administered medications for this visit.     Allergies:   Review of patient's allergies indicates no known allergies.   Social History:  The patient  reports that he quit smoking about 30 years ago. His smoking use included Cigarettes. He has a 60 pack-year smoking history. He has never used smokeless tobacco. He reports that he does not drink alcohol or use illicit drugs.   Family History:  The patient's family history includes Brain cancer in his brother; Diabetes in his paternal  grandmother; Heart attack in his brother and father; Hypertension in his father; Lung cancer in his mother.   ROS:  Please see the history of present illness.   No bleeding problems.    All other systems reviewed and  negative.    PHYSICAL EXAM: VS:  BP 117/70  Pulse 97  Ht 6' (1.829 m)  Wt 150 lb (68.04 kg)  BMI 20.34 kg/m2 Well nourished, well developed, in no acute distress HEENT: normal Neck: no JVD Cardiac:  normal S1, S2; irreg irreg rhythm; no murmur Lungs:  Decreased breath sounds bilaterally, no wheezing, rhonchi or rales Abd: soft, nontender, no hepatomegaly Ext: 1+ bilateral ankle edema Skin: warm and dry Neuro:  CNs 2-12 intact, no focal abnormalities noted  EKG:  AFib HR 97      ASSESSMENT AND PLAN:  1.  Persistent atrial fibrillation:  Rate is much better controlled on combination of beta blocker, calcium channel blocker and Digoxin.  He admits to adherence to Eliquis since at least 09/06/14.  He will be ready for DCCV with 3 weeks of uninterrupted anticoagulation on 10/24.  D/w with Dr. Thompson Grayer today.  Will arrange DCCV next week.  Risks and benefits were discussed with the patient and his wife today.  He and his wife are preparing to go out of town for 2 mos.  Will try to change his appointment with Maximino Greenland, NP (scheduled 10/30) for 2 weeks after his DCCV.  If this conflicts with his travel plans, he will see her back on 10/30 as planned.  2.  Hypertensive heart disease without heart failure:  BP controlled.  3.  Malignant neoplasm of lower lobe of right lung:  FU with oncology as planned.    Disposition:   FU with Roderic Palau, NP as outlined after his DCCV.   Signed, Versie Starks, MHS 09/22/2014 10:30 AM    [facility] [address], [place], [place]  [address] Phone: [phone]; Fax: [phone]

## 2014-09-22 NOTE — Patient Instructions (Signed)
Lab work today. BMET  Your physician has recommended that you have a Cardioversion (DCCV). Electrical Cardioversion uses a jolt of electricity to your heart either through paddles or wired patches attached to your chest. This is a controlled, usually prescheduled, procedure. Defibrillation is done under light anesthesia in the hospital, and you usually go home the day of the procedure. This is done to get your heart back into a normal rhythm. You are not awake for the procedure. Please see the instruction sheet given to you today.  Your physician recommends that you schedule a follow-up appointment in: CAN EITHER KEEP 10/30 APPT WITH DONNA IN AFIB CLINIC, OR F/U 2 WKS AFTER CARDIOVERSION IF BACK FROM TRIP.

## 2014-09-22 NOTE — H&P (Signed)
History and Physical   Date:  09/22/2014   ID:  Anthony Curry, DOB 09-02-1941, MRN [id number]  PCP:  [name], MD  Cardiologist:  Dr. Thompson Grayer     History of Present Illness: Anthony Curry is a 73 y.o. male with a hx of persistent AFib, stage IV lung CA, HTN, hyponatremia, prior ETOH abuse.  He is undergoing chemotherapy for treatment of his CA.  AFib was first dx after a trip to the ED in 04/2014.  Initially seen by Dr. Thompson Grayer in 07/2014 for AFib with RVR during an admission for CAP and possible sepsis.  He was placed on rate control Rx and Eliquis was started for [facility].  He was not seen back until 09/01/14 by Dr. [name] and noted to have never started the Eliquis.  It was recommended that he start taking Eliquis and plan DCCV after 3-4 weeks of uninterrupted anticoagulation.  He was admitted 9/29-10/2 with fever and R sided pleural effusion.  He was treated for possible HCAP.  Of note, his Hgb dropped and his Eliquis was held.  However, workup was negative and the DC notes indicate that the Eliquis was resumed on DC.  He returns for FU.  He is doing well.  He has occ palpitations.  He does feel fatigued.  Denies syncope.  His breathing is better.   No chest pain.  No orthopnea, PND, significant edema.     Studies:  - Echo (8/15):  Mild LVH, EF 55-60%, mod LAE, mild RAE, small effusion   Recent Labs/Images:  Recent Labs  07/26/14 0613 07/27/14 0347  09/17/14 0918 09/17/14 0925  NA 131* 128*  < >  --  133*  K 3.7 4.0  < >  --  3.5  BUN 7 5*  < >  --  22.9  CREATININE 0.56 0.59  < >  --  0.8  ALT 15  --   < >  --  21  HGB 8.8* 9.6*  < > 10.5*  --   TSH 1.700  --   --   --   --   PROBNP  --  1683.0*  --   --   --   < > = values in this interval not displayed.      Wt Readings from Last 3 Encounters:  09/22/14 150 lb (68.04 kg)  09/17/14 157 lb 11.2 oz (71.532 kg)  09/05/14 157 lb 6.5 oz (71.4 kg)     Past Medical History  Diagnosis Date  .  Hypertension   . GERD (gastroesophageal reflux disease)   . Alcohol abuse   . 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 Low sodium   . Cancer     small cell/lung  . Persistent atrial fibrillation 04/11/14    chads2vasc score is 2  . Hypotension   . Atrial fibrillation with RVR   . HCAP (healthcare-associated pneumonia)   . Adrenal hemorrhage   . BPH (benign prostatic hyperplasia)   . Hyponatremia   . Anemia of chronic disease   . Fall   . Alcohol intoxication   . Pulmonary nodules   . Adrenal mass   . Atrial flutter by electrocardiogram   . Neoplastic malignant related fatigue   . 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 Alcoholism in remission     Current Outpatient Prescriptions  Medication Sig Dispense Refill  . ALPRAZolam (XANAX) 0.25 MG tablet Take 0.25 mg by mouth 3 (three) times daily as needed for anxiety or sleep.      [name]  apixaban (ELIQUIS) 5 MG TABS tablet Take 1 tablet (5 mg total) by mouth 2 (two) times daily.  180 tablet  1  . Ascorbic Acid (VITAMIN C PO) Take 1,000 mg by mouth daily.       . B Complex-C (SUPER B COMPLEX PO) Take 1 each by mouth daily.       . Biotin 7500 MCG TABS Take 1 tablet by mouth daily.       . calcium carbonate (OS-CAL) 600 MG TABS Take 600 mg by mouth daily with breakfast.      . demeclocycline (DECLOMYCIN) 150 MG tablet Take 150 mg by mouth 2 (two) times daily.      [name] dexamethasone (DECADRON) 4 MG tablet Take 4 mg by mouth See admin instructions. Take 1 tablet twice a day the day before, day of, and day after chemotherapy every three weeks.      . digoxin (LANOXIN) 0.125 MG tablet Take 1 tablet (0.125 mg total) by mouth daily.  90 tablet  1  . diltiazem (CARDIZEM CD) 360 MG 24 hr capsule Take 1 capsule (360 mg total) by mouth daily at 12 noon.  90 capsule  3  . furosemide (LASIX) 40 MG tablet Take 40 mg by mouth 2 (two) times daily.       [name] HYDROcodone-acetaminophen (NORCO/VICODIN) 5-325 MG per tablet Take 1-2 tablets by mouth  every 4 (four) hours as needed (pain).      . Lactobacillus (ACIDOPHILUS) CAPS capsule Take 1 capsule by mouth daily.      [name] MAGNESIUM PO Take 400 mg by mouth daily.       . methylPREDNIsolone (MEDROL DOSPACK) 4 MG tablet Take 4 mg by mouth as directed. follow package directions      . metoprolol tartrate (LOPRESSOR) 25 MG tablet Take 1 tablet (25 mg total) by mouth 2 (two) times daily.  180 tablet  1  . mirtazapine (REMERON) 15 MG tablet Take 1 tablet (15 mg total) by mouth at bedtime.  30 tablet  0  . Multiple Vitamin (MULTIVITAMIN WITH MINERALS) TABS Take 1 tablet by mouth daily.      . Nutritional Supplements (ENSURE COMPLETE PO) Take 1 Can by mouth 2 (two) times daily.      . Omega-3 Fatty Acids (FISH OIL PO) Take 1 tablet by mouth daily.      [name] omeprazole (PRILOSEC) 20 MG capsule Take 20 mg by mouth daily as needed (heartburn).       . Potassium 99 MG TABS Take 1 tablet by mouth daily.      [name] PRESCRIPTION MEDICATION Chemo at [facility]      . tamsulosin (FLOMAX) 0.4 MG CAPS capsule Take 0.4 mg by mouth daily.       [name] thiamine (VITAMIN B-1) 100 MG tablet Take 1 tablet (100 mg total) by mouth daily.  30 tablet  6  . vitamin E 400 UNIT capsule Take 400 Units by mouth daily.       No current facility-administered medications for this visit.     Allergies:   Review of patient's allergies indicates no known allergies.   Social History:  The patient  reports that he quit smoking about 30 years ago. His smoking use included Cigarettes. He has a 60 pack-year smoking history. He has never used smokeless tobacco. He reports that he does not drink alcohol or use illicit drugs.   Family History:  The patient's family history includes Brain cancer in his brother; Diabetes in his paternal  grandmother; Heart attack in his brother and father; Hypertension in his father; Lung cancer in his mother.   ROS:  Please see the history of present illness.   No bleeding problems.    All other systems reviewed and  negative.    PHYSICAL EXAM: VS:  BP 117/70  Pulse 97  Ht 6' (1.829 m)  Wt 150 lb (68.04 kg)  BMI 20.34 kg/m2 Well nourished, well developed, in no acute distress HEENT: normal Neck: no JVD Cardiac:  normal S1, S2; irreg irreg rhythm; no murmur Lungs:  Decreased breath sounds bilaterally, no wheezing, rhonchi or rales Abd: soft, nontender, no hepatomegaly Ext: 1+ bilateral ankle edema Skin: warm and dry Neuro:  CNs 2-12 intact, no focal abnormalities noted  EKG:  AFib HR 97      ASSESSMENT AND PLAN:  1.  Persistent atrial fibrillation:  Rate is much better controlled on combination of beta blocker, calcium channel blocker and Digoxin.  He admits to adherence to Eliquis since at least 09/06/14.  He will be ready for DCCV with 3 weeks of uninterrupted anticoagulation on 10/24.  D/w with Dr. Thompson Grayer today.  Will arrange DCCV next week.  Risks and benefits were discussed with the patient and his wife today.  He and his wife are preparing to go out of town for 2 mos.  Will try to change his appointment with Maximino Greenland, NP (scheduled 10/30) for 2 weeks after his DCCV.  If this conflicts with his travel plans, he will see her back on 10/30 as planned.  2.  Hypertensive heart disease without heart failure:  BP controlled.  3.  Malignant neoplasm of lower lobe of right lung:  FU with oncology as planned.    Disposition:   FU with Roderic Palau, NP as outlined after his DCCV.   Signed, Versie Starks, MHS 09/22/2014 10:30 AM    [facility] [address], [place], [place]  [address] Phone: [phone]; Fax: [phone]

## 2014-09-23 ENCOUNTER — Telehealth: Payer: Self-pay | Admitting: *Deleted

## 2014-09-23 DIAGNOSIS — I119 Hypertensive heart disease without heart failure: Secondary | ICD-10-CM

## 2014-09-23 MED ORDER — POTASSIUM CHLORIDE ER 20 MEQ PO TBCR: 20.0000 meq | EXTENDED_RELEASE_TABLET | ORAL | Status: DC

## 2014-09-23 NOTE — Telephone Encounter (Signed)
pt's wife cb and said he sister looked up on the internet that if you have a low K+ you need to be hospitalized. I explained to her that we were starting Rx K+ today, d/c OTC K+, check bmet 10/30. I said not to panic about reading things on the internet.

## 2014-09-23 NOTE — Telephone Encounter (Signed)
pt notified about lab results and to d/c OTC K+ and to start Rx K+ 20 meq bid with the 1st dose being 40 bid =  80 meq on the 1st day. BMET 10/30... Pt verbalized understanding to Plan of Care.

## 2014-09-29 ENCOUNTER — Ambulatory Visit (HOSPITAL_COMMUNITY)
Admission: RE | Admit: 2014-09-29 | Discharge: 2014-09-29 | Disposition: A | Discharge status: Home / Self Care | Payer: Medicare Other | Source: Ambulatory Visit | Attending: Cardiovascular Disease | Admitting: Cardiovascular Disease

## 2014-09-29 ENCOUNTER — Encounter (HOSPITAL_COMMUNITY)
Admission: RE | Disposition: A | Discharge status: Home / Self Care | Payer: Self-pay | Source: Ambulatory Visit | Attending: Cardiovascular Disease

## 2014-09-29 ENCOUNTER — Encounter (HOSPITAL_COMMUNITY): Payer: Medicare Other | Admitting: Certified Registered Nurse Anesthetist

## 2014-09-29 ENCOUNTER — Encounter (HOSPITAL_COMMUNITY): Payer: Self-pay

## 2014-09-29 ENCOUNTER — Ambulatory Visit (HOSPITAL_COMMUNITY): Payer: Medicare Other | Admitting: Certified Registered Nurse Anesthetist

## 2014-09-29 DIAGNOSIS — F1021 Alcohol dependence, in remission: Secondary | ICD-10-CM | POA: Diagnosis not present

## 2014-09-29 DIAGNOSIS — I481 Persistent atrial fibrillation: Secondary | ICD-10-CM | POA: Insufficient documentation

## 2014-09-29 DIAGNOSIS — I119 Hypertensive heart disease without heart failure: Secondary | ICD-10-CM | POA: Insufficient documentation

## 2014-09-29 DIAGNOSIS — I4891 Unspecified atrial fibrillation: Secondary | ICD-10-CM | POA: Diagnosis not present

## 2014-09-29 DIAGNOSIS — N400 Enlarged prostate without lower urinary tract symptoms: Secondary | ICD-10-CM | POA: Insufficient documentation

## 2014-09-29 DIAGNOSIS — E871 Hypo-osmolality and hyponatremia: Secondary | ICD-10-CM | POA: Diagnosis not present

## 2014-09-29 DIAGNOSIS — Z87891 Personal history of nicotine dependence: Secondary | ICD-10-CM | POA: Insufficient documentation

## 2014-09-29 DIAGNOSIS — I4819 Other persistent atrial fibrillation: Secondary | ICD-10-CM

## 2014-09-29 DIAGNOSIS — C3431 Malignant neoplasm of lower lobe, right bronchus or lung: Secondary | ICD-10-CM | POA: Diagnosis not present

## 2014-09-29 DIAGNOSIS — M19012 Primary osteoarthritis, left shoulder: Secondary | ICD-10-CM | POA: Insufficient documentation

## 2014-09-29 DIAGNOSIS — K219 Gastro-esophageal reflux disease without esophagitis: Secondary | ICD-10-CM | POA: Diagnosis not present

## 2014-09-29 HISTORY — PX: CARDIOVERSION: SHX1299

## 2014-09-29 LAB — POCT I-STAT 4, (NA,K, GLUC, HGB,HCT)
GLUCOSE: 96 mg/dL (ref 70–99)
HEMATOCRIT: 38 % — AB (ref 39.0–52.0)
HEMOGLOBIN: 12.9 g/dL — AB (ref 13.0–17.0)
Potassium: 3.3 mEq/L — ABNORMAL LOW (ref 3.7–5.3)
Sodium: 132 mEq/L — ABNORMAL LOW (ref 137–147)

## 2014-09-29 SURGERY — CARDIOVERSION
Anesthesia: General

## 2014-09-29 MED ORDER — LACTATED RINGERS IV SOLN
INTRAVENOUS | Status: DC
  Administered 2014-09-29: 11:00:00 via INTRAVENOUS

## 2014-09-29 MED ORDER — PROPOFOL 10 MG/ML IV BOLUS
INTRAVENOUS | Status: DC | PRN
  Administered 2014-09-29: 100 mg via INTRAVENOUS

## 2014-09-29 MED ORDER — LIDOCAINE HCL (CARDIAC) 20 MG/ML IV SOLN
INTRAVENOUS | Status: DC | PRN
  Administered 2014-09-29: 80 mg via INTRAVENOUS

## 2014-09-29 MED ORDER — LACTATED RINGERS IV SOLN
INTRAVENOUS | Status: DC | PRN
  Administered 2014-09-29: 12:00:00 via INTRAVENOUS

## 2014-09-29 NOTE — Interval H&P Note (Signed)
History and Physical Interval Note:  09/29/2014 11:54 AM  Anthony Curry  has presented today for surgery, with the diagnosis of afib  The various methods of treatment have been discussed with the patient and family. After consideration of risks, benefits and other options for treatment, the patient has consented to  Procedure(s): CARDIOVERSION (N/A) as a surgical intervention .  The patient's history has been reviewed, patient examined, no change in status, stable for surgery.  I have reviewed the patient's chart and labs.  Questions were answered to the patient's satisfaction.     Jenkins Rouge

## 2014-09-29 NOTE — Transfer of Care (Signed)
Immediate Anesthesia Transfer of Care Note  Patient: Anthony Curry  Procedure(s) Performed: Procedure(s): CARDIOVERSION (N/A)  Patient Location: Endoscopy Unit  Anesthesia Type:MAC  Level of Consciousness: sedated  Airway & Oxygen Therapy: Patient Spontanous Breathing and Patient connected to nasal cannula oxygen  Post-op Assessment: Report given to PACU RN, Post -op Vital signs reviewed and stable and Patient moving all extremities  Post vital signs: Reviewed and stable  Complications: No apparent anesthesia complications

## 2014-09-29 NOTE — Anesthesia Postprocedure Evaluation (Signed)
  Anesthesia Post-op Note  Patient: Anthony Curry  Procedure(s) Performed: Procedure(s): CARDIOVERSION (N/A)  Patient Location: PACU  Anesthesia Type: General   Level of Consciousness: awake, alert  and oriented  Airway and Oxygen Therapy: Patient Spontanous Breathing  Post-op Pain: none  Post-op Assessment: Post-op Vital signs reviewed  Post-op Vital Signs: Reviewed  Last Vitals:  Filed Vitals:   09/29/14 1240  BP: 154/60  Pulse: 72  Temp:   Resp: 17    Complications: No apparent anesthesia complications

## 2014-09-29 NOTE — Anesthesia Preprocedure Evaluation (Addendum)
Anesthesia Evaluation  Patient identified by MRN, date of birth, ID band Patient awake    History of Anesthesia Complications Negative for: history of anesthetic complications  Airway Mallampati: I  TM Distance: >3 FB Neck ROM: Full    Dental  (+) Upper Dentures, Lower Dentures, Dental Advisory Given   Pulmonary neg shortness of breath, pneumonia -, resolved, former smoker,  breath sounds clear to auscultation        Cardiovascular hypertension, Pt. on medications Rhythm:Irregular Rate:Normal  EF 55-60%   Neuro/Psych    GI/Hepatic GERD-  Medicated and Controlled,Liver cancer- s/p chemotherapy   Endo/Other    Renal/GU      Musculoskeletal  (+) Arthritis -,   Abdominal   Peds  Hematology  (+) anemia ,   Anesthesia Other Findings   Reproductive/Obstetrics                           Anesthesia Physical Anesthesia Plan  ASA: III  Anesthesia Plan: General   Post-op Pain Management:    Induction: Intravenous  Airway Management Planned: Mask  Additional Equipment:   Intra-op Plan:   Post-operative Plan:   Informed Consent: I have reviewed the patients History and Physical, chart, labs and discussed the procedure including the risks, benefits and alternatives for the proposed anesthesia with the patient or authorized representative who has indicated his/her understanding and acceptance.   Dental advisory given  Plan Discussed with: CRNA, Anesthesiologist and Surgeon  Anesthesia Plan Comments:        Anesthesia Quick Evaluation

## 2014-09-29 NOTE — Discharge Instructions (Addendum)
Continue Eliquil F/U Dr [name] as scheduled  Electrical Cardioversion, Care After Refer to this sheet in the next few weeks. These instructions provide you with information on caring for yourself after your procedure. Your health care provider may also give you more specific instructions. Your treatment has been planned according to current medical practices, but problems sometimes occur. Call your health care provider if you have any problems or questions after your procedure. WHAT TO EXPECT AFTER THE PROCEDURE After your procedure, it is typical to have the following sensations:  Some redness on the skin where the shocks were delivered. If this is tender, a sunburn lotion or hydrocortisone cream may help.  Possible return of an abnormal heart rhythm within hours or days after the procedure. HOME CARE INSTRUCTIONS  Take medicines only as directed by your health care provider. Be sure you understand how and when to take your medicine.  Learn how to feel your pulse and check it often.  Limit your activity for 48 hours after the procedure or as directed by your health care provider.  Avoid or minimize caffeine and other stimulants as directed by your health care provider. SEEK MEDICAL CARE IF:  You feel like your heart is beating too fast or your pulse is not regular.  You have any questions about your medicines.  You have bleeding that will not stop. SEEK IMMEDIATE MEDICAL CARE IF:  You are dizzy or feel faint.  It is hard to breathe or you feel short of breath.  There is a change in discomfort in your chest.  Your speech is slurred or you have trouble moving an arm or leg on one side of your body.  You get a serious muscle cramp that does not go away.  Your fingers or toes turn cold or blue. Document Released: 09/11/2013 Document Revised: 04/07/2014 Document Reviewed: 09/11/2013 [facility] Patient Information 2015 [place], [place]. This information is not intended to replace  advice given to you by your health care provider. Make sure you discuss any questions you have with your health care provider.

## 2014-09-29 NOTE — CV Procedure (Signed)
DCC: Anesthesia Dr Al Corpus On Rx Eliquis  Shock x 2  150J then 301T biphasic Concerted from afib rate 94 to SR rate 70  No immediate neurologic sequelae  Jenkins Rouge

## 2014-09-30 ENCOUNTER — Encounter (HOSPITAL_COMMUNITY): Payer: Self-pay | Admitting: Cardiovascular Disease

## 2014-10-03 ENCOUNTER — Other Ambulatory Visit: Payer: Medicare Other

## 2014-10-03 ENCOUNTER — Ambulatory Visit (HOSPITAL_COMMUNITY): Payer: Medicare Other | Admitting: Nurse Practitioner

## 2014-10-07 ENCOUNTER — Telehealth: Payer: Self-pay | Admitting: Nutrition

## 2014-10-07 NOTE — Telephone Encounter (Signed)
Patient's wife requesting samples of oral nutrition supplements for patient. (Patient does not like chocolate.)  I have left them for her at front desk along with coupons.  Wife appreciative and will pick them up today.

## 2014-10-08 ENCOUNTER — Ambulatory Visit: Payer: Medicare Other

## 2014-10-10 ENCOUNTER — Other Ambulatory Visit: Payer: Self-pay | Admitting: Internal Medicine

## 2014-10-10 MED ORDER — FUROSEMIDE 40 MG PO TABS: 40.0000 mg | ORAL_TABLET | Freq: Two times a day (BID) | ORAL | Status: DC

## 2014-10-14 ENCOUNTER — Ambulatory Visit (HOSPITAL_COMMUNITY): Payer: Medicare Other | Admitting: Nurse Practitioner

## 2014-10-20 ENCOUNTER — Encounter: Payer: Self-pay | Admitting: Internal Medicine

## 2014-10-20 ENCOUNTER — Ambulatory Visit (INDEPENDENT_AMBULATORY_CARE_PROVIDER_SITE_OTHER): Payer: Medicare Other | Admitting: Internal Medicine

## 2014-10-20 VITALS — BP 137/77 | HR 60 | Ht 72.0 in | Wt 156.8 lb

## 2014-10-20 DIAGNOSIS — I4891 Unspecified atrial fibrillation: Secondary | ICD-10-CM | POA: Diagnosis not present

## 2014-10-20 DIAGNOSIS — I119 Hypertensive heart disease without heart failure: Secondary | ICD-10-CM | POA: Diagnosis not present

## 2014-10-20 DIAGNOSIS — I481 Persistent atrial fibrillation: Secondary | ICD-10-CM | POA: Diagnosis not present

## 2014-10-20 DIAGNOSIS — I4819 Other persistent atrial fibrillation: Secondary | ICD-10-CM

## 2014-10-20 MED ORDER — DILTIAZEM HCL ER COATED BEADS 240 MG PO CP24: 240.0000 mg | ORAL_CAPSULE | Freq: Every day | ORAL | Status: DC

## 2014-10-20 NOTE — Progress Notes (Signed)
Primary Care Physician: [name], MD Primary Electrophysiologist: Dr. Audree Camel is a 73 y.o. male with a h/opersistent atrial fibrillation, who presents for consultation in [facility].  Anthony Curry has h/o stage IV lung CA, HTN, hyponatremia, prior ETOH abuse. Anthony Curry is undergoing chemotherapy for treatment of his CA. AFib was first dx after a trip to [facility] ED in 04/2014. Initially seen by Dr. Thompson Grayer in 07/2014 for AFib with RVR during an admission for CAP and possible sepsis. Anthony Curry was placed on rate control Rx and Eliquis was started for [facility]. Anthony Curry was not seen back until 09/01/14 by Dr. [name] and noted to have never started Anthony Eliquis. It was recommended that Anthony Curry start taking Eliquis and plan DCCV after 3-4 weeks of uninterrupted anticoagulation. Anthony Curry was admitted 9/29-10/2 with fever and R sided pleural effusion and treated for possible HCAP. Anthony Curry did undergo successful DCCV 10/29 and is here for f/u. Family reports Anthony Curry appears to have more energy in SR but Anthony Curry states Anthony Curry can't tell too much difference.Anthony Curry is currently on a hiatus form chemotherapy, but is pending a CT scan within  1-2 weeks and then has f/u with oncologist to discuss further management. Anthony Curry has had a small persistent rt pleural effusion.   Today, Anthony Curry denies symptoms of palpitations, chest pain, shortness of breath, orthopnea, PND,  edema, dizziness, presyncope, syncope, snoring, daytime somnolence, bleeding, or neurologic sequela.Positive for lowere extremity edema. Anthony Curry is tolerating medications without difficulties and is otherwise without complaint today.    Atrial Fibrillation Risk Factors:  Anthony Curry does not have symptoms or diagnosis of sleep apnea.  Anthony Curry does not have a history of rheumatic fever.  Anthony Curry does have a history of alcohol use, currently not drinking.  Anthony Curry has a BMI of Body mass index is 21.26 kg/(m^2).[name] Filed Weights   10/20/14 1142  Weight: 156  lb 12.8 oz (71.124 kg)    LA size: Moderately dilated   Atrial Fibrillation Management history:  Previous antiarrhythmic drugs: none  Previous cardioversions: x1  Previous ablations: none  CHADS2VASC score: at least 2-3 (age x 2, previous HTN)  Anticoagulation history: apixaban   Past Medical History  Diagnosis Date  . Hypertension   . GERD (gastroesophageal reflux disease)   . Alcohol abuse   . 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 Low sodium   . Cancer     small cell/lung  . Persistent atrial fibrillation 04/11/14    chads2vasc score is 2  . Hypotension   . Atrial fibrillation with RVR   . HCAP (healthcare-associated pneumonia)   . Adrenal hemorrhage   . BPH (benign prostatic hyperplasia)   . Hyponatremia   . Anemia of chronic disease   . Fall   . Alcohol intoxication   . Pulmonary nodules   . Adrenal mass   . Atrial flutter by electrocardiogram   . Neoplastic malignant related fatigue   . 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 Alcoholism in remission    Past Surgical History  Procedure Laterality Date  . Hemorrhoid surgery    . Ankle arthrodesis  1995    rt fx-hardware in  . Tonsillectomy    . Colonoscopy    . Excision / curettage bone cyst finger  2005    rt thumb  . Total shoulder arthroplasty  02/03/2012    Procedure: TOTAL SHOULDER ARTHROPLASTY;  Surgeon: [name], MD;  Location: [facility];  Service:  Orthopedics;  Laterality: Left;  . Cardioversion N/A 09/29/2014    Procedure: CARDIOVERSION;  Surgeon: Josue Hector, MD;  Location: [facility] ENDOSCOPY;  Service: Cardiovascular;  Laterality: N/A;    Current Outpatient Prescriptions  Medication Sig Dispense Refill  . ALPRAZolam (XANAX) 0.25 MG tablet Take 0.25 mg by mouth 3 (three) times daily as needed for anxiety or sleep.    [name] apixaban (ELIQUIS) 5 MG TABS tablet Take 1 tablet (5 mg total) by mouth 2 (two) times daily. 180 tablet 1  . Ascorbic Acid (VITAMIN C PO)  Take 1,000 mg by mouth daily.     . B Complex-C (SUPER B COMPLEX PO) Take 1 each by mouth daily.     . Biotin 7500 MCG TABS Take 1 tablet by mouth daily.     . calcium carbonate (OS-CAL) 600 MG TABS Take 600 mg by mouth daily with breakfast.    . demeclocycline (DECLOMYCIN) 150 MG tablet Take 150 mg by mouth 2 (two) times daily.    [name] dexamethasone (DECADRON) 4 MG tablet Take 4 mg by mouth See admin instructions. Take 1 tablet twice a day Anthony day before, day of, and day after chemotherapy every three weeks.    [name] diltiazem (CARDIZEM CD) 240 MG 24 hr capsule Take 1 capsule (240 mg total) by mouth daily at 12 noon. 90 capsule 3  . furosemide (LASIX) 40 MG tablet Take 1 tablet (40 mg total) by mouth 2 (two) times daily. 30 tablet 3  . KLOR-CON M20 20 MEQ tablet     . Lactobacillus (ACIDOPHILUS) CAPS capsule Take 1 capsule by mouth daily.    [name] MAGNESIUM PO Take 400 mg by mouth daily.     . methylPREDNIsolone (MEDROL DOSPACK) 4 MG tablet Take 4 mg by mouth as directed. follow package directions    . metoprolol tartrate (LOPRESSOR) 25 MG tablet Take 1 tablet (25 mg total) by mouth 2 (two) times daily. 180 tablet 1  . Multiple Vitamin (MULTIVITAMIN WITH MINERALS) TABS Take 1 tablet by mouth daily.    . Nutritional Supplements (ENSURE COMPLETE PO) Take 1 Can by mouth 2 (two) times daily.    . Omega-3 Fatty Acids (FISH OIL PO) Take 1 tablet by mouth daily.    [name] omeprazole (PRILOSEC) 20 MG capsule Take 20 mg by mouth daily as needed (heartburn).     . Potassium Chloride ER 20 MEQ TBCR Take 20 mEq by mouth as directed. 1st day take 4 tabs total = 80 meq; starting on day 2 take 1 tab twice daily 65 tablet 6  . PRESCRIPTION MEDICATION Chemo at [facility]    . tamsulosin (FLOMAX) 0.4 MG CAPS capsule Take 0.4 mg by mouth daily.     [name] thiamine (VITAMIN B-1) 100 MG tablet Take 1 tablet (100 mg total) by mouth daily. 30 tablet 6  . vitamin E 400 UNIT capsule Take 400 Units by mouth daily.    [name] HYDROcodone-acetaminophen  (NORCO/VICODIN) 5-325 MG per tablet Take 1-2 tablets by mouth every 4 (four) hours as needed (pain).    . mirtazapine (REMERON) 15 MG tablet Take 1 tablet (15 mg total) by mouth at bedtime. 30 tablet 0   No current facility-administered medications for this visit.    No Known Allergies  History   Social History  . Marital Status: Married    Spouse Name: N/A    Number of Children: N/A  . Years of Education: N/A   Occupational History  . retired    Science writer  History Main Topics  . Smoking status: Former Smoker -- 2.00 packs/day for 30 years    Types: Cigarettes    Quit date: 01/27/1984  . Smokeless tobacco: Never Used  . Alcohol Use: No  . Drug Use: No  . Sexual Activity: Not Currently   Other Topics Concern  . Not on file   Social History Narrative   Retired from [employer] History  Problem Relation Age of Onset  . Brain cancer Brother   . Lung cancer Mother   . Hypertension Father   . Diabetes Paternal Grandmother   . Heart attack Father   . Heart attack Brother    Anthony Curry does not have a history of early familial atrial fibrillation or other arrhythmias.  ROS- All systems are reviewed and negative except as per Anthony HPI above.  Physical Exam: Filed Vitals:   10/20/14 1142  BP: 137/77  Pulse: 60  Height: 6' (1.829 m)  Weight: 156 lb 12.8 oz (71.124 kg)    GEN- Anthony Curry is well appearing, alert and oriented x 3 today.   Head- normocephalic, atraumatic Eyes-  Sclera clear, conjunctiva pink Ears- hearing intact Oropharynx- clear Neck- supple, no JVP Lymph- no cervical lymphadenopathy Lungs- Clear to ausculation bilaterally, normal work of breathing Heart- Regular rate and rhythm, no murmurs, rubs or gallops, PMI not laterally displaced GI- soft, NT, ND, + BS Extremities- no clubbing, cyanosis, or 1t edma, rt greater than  Left MS- no significant deformity or atrophy Skin- no rash or lesion Psych- euthymic mood, full affect Neuro- strength  and sensation are intact  EKG - NSR with 1st degree av block, rightward axis, possible anterior infarct, age indetermined. Echo- Left ventricle: Anthony cavity size was normal. Wall thickness was increased in a pattern of mild LVH. Systolic function was normal. Anthony estimated ejection fraction was in Anthony range of 55% to 60%. - Aortic valve: Valve area (Vmax): 3.22 cm^2. - Left atrium: Anthony atrium was moderately dilated. - Right atrium: Anthony atrium was mildly dilated. - Pericardium, extracardiac: Small posterior pericardial effusion  Epic records are reviewed at length today  Assessment and Plan:  1. Atrial fibrillation Anthony Curry has Symptomatic persistent atrial fibrillation.  Anthony patients CHAD2VASC score is 2-3.  Anthony Curry is  appropriately anticoagulated at this time. Anthony Curry is adequately rate controlled Antiarrhythmic therapy to dates has included none  Anthony patients left atrial size is moderately dilated..  Additional echo findings include normal EF.   Presently, our recommendations include:  1.Stop digoxin and decrease cardiazem to 240mg  a day. Continue apixaban for chadsvasc of 2-3. F/U 3 months in afib clinic.  Roderic Palau NP  Nurse Practitioner, [facility]   I have seen, examined Anthony Curry, and reviewed Anthony above assessment and plan.  Changes to above are made where necessary.   Anthony Curry is maintaining sinus rhythm off of AAD after recent cardioversion.  Continue apixaban.  Stop digoxin and decrease diltiazem.  Given edema, consider further decreasing diltiazem upon return to follow-up in Anthony AF clinic in 3 months.  Hypertensive cardiovascular disease is stable and we will follow this closely with decreased diltiazem.  Co Sign: Thompson Grayer, MD 10/20/2014 8:39 PM

## 2014-10-20 NOTE — Patient Instructions (Signed)
Your physician recommends that you schedule a follow-up appointment in: 3 months with Roderic Palau, NP in the afib clinic at the hospital  Your physician has recommended you make the following change in your medication:  1) Stop Digoxin 2) Decrease Diltiazem to 240mg  daily

## 2014-10-27 DIAGNOSIS — H25811 Combined forms of age-related cataract, right eye: Secondary | ICD-10-CM | POA: Diagnosis not present

## 2014-10-28 ENCOUNTER — Telehealth: Payer: Self-pay | Admitting: *Deleted

## 2014-10-28 NOTE — Telephone Encounter (Signed)
Pt's wife called stating that they are going to be staying in [place] for 2-3 months with their daughter.  Pt has a lab/ CT/ f/u with [facility] at the end of December.  She wants to see if patient can have scan in [place] and have it sent to Dr Vista Mink.  Per dr Vista Mink, he would prefer to have CT and f/u done sooner here.  Attempted to call her back, no answer, left msg on her vm to call us back.

## 2014-10-29 NOTE — Telephone Encounter (Signed)
Pt's wife called back and left a msg, attempted to call her back, no answer on home or cell, left msg to call back

## 2014-11-10 ENCOUNTER — Ambulatory Visit (HOSPITAL_COMMUNITY)
Admission: RE | Admit: 2014-11-10 | Discharge: 2014-11-10 | Disposition: A | Discharge status: Home / Self Care | Payer: Medicare Other | Source: Ambulatory Visit | Attending: Internal Medicine | Admitting: Internal Medicine

## 2014-11-10 ENCOUNTER — Other Ambulatory Visit (HOSPITAL_BASED_OUTPATIENT_CLINIC_OR_DEPARTMENT_OTHER): Payer: Medicare Other

## 2014-11-10 DIAGNOSIS — C3490 Malignant neoplasm of unspecified part of unspecified bronchus or lung: Secondary | ICD-10-CM

## 2014-11-10 DIAGNOSIS — N323 Diverticulum of bladder: Secondary | ICD-10-CM | POA: Insufficient documentation

## 2014-11-10 DIAGNOSIS — C3431 Malignant neoplasm of lower lobe, right bronchus or lung: Secondary | ICD-10-CM | POA: Insufficient documentation

## 2014-11-10 DIAGNOSIS — K8020 Calculus of gallbladder without cholecystitis without obstruction: Secondary | ICD-10-CM | POA: Diagnosis not present

## 2014-11-10 DIAGNOSIS — N320 Bladder-neck obstruction: Secondary | ICD-10-CM | POA: Insufficient documentation

## 2014-11-10 DIAGNOSIS — C7972 Secondary malignant neoplasm of left adrenal gland: Secondary | ICD-10-CM | POA: Diagnosis not present

## 2014-11-10 DIAGNOSIS — J90 Pleural effusion, not elsewhere classified: Secondary | ICD-10-CM | POA: Diagnosis not present

## 2014-11-10 DIAGNOSIS — C3410 Malignant neoplasm of upper lobe, unspecified bronchus or lung: Secondary | ICD-10-CM

## 2014-11-10 DIAGNOSIS — Z9221 Personal history of antineoplastic chemotherapy: Secondary | ICD-10-CM | POA: Insufficient documentation

## 2014-11-10 LAB — CBC WITH DIFFERENTIAL/PLATELET
BASO%: 0.5 % (ref 0.0–2.0)
BASOS ABS: 0 10*3/uL (ref 0.0–0.1)
EOS ABS: 0.2 10*3/uL (ref 0.0–0.5)
EOS%: 4.8 % (ref 0.0–7.0)
HCT: 38.3 % — ABNORMAL LOW (ref 38.4–49.9)
HEMOGLOBIN: 13.2 g/dL (ref 13.0–17.1)
LYMPH#: 1.3 10*3/uL (ref 0.9–3.3)
LYMPH%: 32.4 % (ref 14.0–49.0)
MCH: 32.1 pg (ref 27.2–33.4)
MCHC: 34.5 g/dL (ref 32.0–36.0)
MCV: 93.2 fL (ref 79.3–98.0)
MONO#: 0.4 10*3/uL (ref 0.1–0.9)
MONO%: 10.1 % (ref 0.0–14.0)
NEUT#: 2.1 10*3/uL (ref 1.5–6.5)
NEUT%: 52.2 % (ref 39.0–75.0)
Platelets: 205 10*3/uL (ref 140–400)
RBC: 4.11 10*6/uL — AB (ref 4.20–5.82)
RDW: 14.4 % (ref 11.0–14.6)
WBC: 4 10*3/uL (ref 4.0–10.3)

## 2014-11-10 LAB — COMPREHENSIVE METABOLIC PANEL (CC13)
ALK PHOS: 101 U/L (ref 40–150)
ALT: 11 U/L (ref 0–55)
AST: 22 U/L (ref 5–34)
Albumin: 3.5 g/dL (ref 3.5–5.0)
Anion Gap: 11 mEq/L (ref 3–11)
BUN: 10.1 mg/dL (ref 7.0–26.0)
CALCIUM: 9.2 mg/dL (ref 8.4–10.4)
CHLORIDE: 96 meq/L — AB (ref 98–109)
CO2: 28 mEq/L (ref 22–29)
Creatinine: 0.7 mg/dL (ref 0.7–1.3)
GLUCOSE: 64 mg/dL — AB (ref 70–140)
Potassium: 3.4 mEq/L — ABNORMAL LOW (ref 3.5–5.1)
SODIUM: 135 meq/L — AB (ref 136–145)
TOTAL PROTEIN: 5.9 g/dL — AB (ref 6.4–8.3)
Total Bilirubin: 0.57 mg/dL (ref 0.20–1.20)

## 2014-11-10 MED ORDER — IOHEXOL 300 MG/ML  SOLN
100.0000 mL | Freq: Once | INTRAMUSCULAR | Status: AC | PRN
  Administered 2014-11-10: 100 mL via INTRAVENOUS

## 2014-11-13 ENCOUNTER — Telehealth: Payer: Self-pay | Admitting: Medical Oncology

## 2014-11-13 NOTE — Telephone Encounter (Signed)
Per Dr [name] i told wife there is an area on scan that dr [name] will discuss at appt 03-01-2023. She told me their son died last night.

## 2014-11-17 ENCOUNTER — Ambulatory Visit (HOSPITAL_BASED_OUTPATIENT_CLINIC_OR_DEPARTMENT_OTHER): Payer: Medicare Other | Admitting: Internal Medicine

## 2014-11-17 ENCOUNTER — Encounter: Payer: Self-pay | Admitting: Internal Medicine

## 2014-11-17 ENCOUNTER — Ambulatory Visit (HOSPITAL_COMMUNITY): Payer: Medicare Other

## 2014-11-17 ENCOUNTER — Other Ambulatory Visit: Payer: Medicare Other

## 2014-11-17 ENCOUNTER — Telehealth: Payer: Self-pay | Admitting: Internal Medicine

## 2014-11-17 VITALS — BP 135/86 | HR 65 | Temp 97.8°F | Resp 18 | Ht 72.0 in | Wt 153.9 lb

## 2014-11-17 DIAGNOSIS — Z85118 Personal history of other malignant neoplasm of bronchus and lung: Secondary | ICD-10-CM | POA: Diagnosis not present

## 2014-11-17 DIAGNOSIS — C3431 Malignant neoplasm of lower lobe, right bronchus or lung: Secondary | ICD-10-CM

## 2014-11-17 NOTE — Telephone Encounter (Signed)
Gave avs & cal for Feb. °

## 2014-11-17 NOTE — Progress Notes (Signed)
[facility] Telephone:([phone]) [phone]   Fax:(336) [place], [place] [address]  DIAGNOSIS: Stage IV (T1 A., N0, M1 B.) non-small cell lung cancer, adenocarcinoma with negative EGFR mutation and negative ALK gene translocation diagnosed in October of 2014.   Molecular profile: Negative for RET, ALK, BRAF, MET, EGFR.  Positive for ERBB2 A775T, KDX8P382N, KRAS G13E, MAP2K1 D67N (see full report).   PRIOR THERAPY:  1) Systemic chemotherapy with carboplatin for AUC of 5 and Alimta 500 mg/M2 every 3 weeks, status post 6 cycles with stable disease. First dose on 10/23/2013. 2) Maintenance systemic chemotherapy with single agent Alimta 500 mg/M2 every 3 weeks. Status post 8 cycles.    CURRENT THERAPY: Observation.  CHEMOTHERAPY INTENT: Palliative  CURRENT # OF CHEMOTHERAPY CYCLES: 8 CURRENT ANTIEMETICS: Zofran, Compazine and dexamethasone  CURRENT SMOKING STATUS: Former smoker  ORAL CHEMOTHERAPY AND CONSENT: None  CURRENT BISPHOSPHONATES USE: None  PAIN MANAGEMENT: 0/10  NARCOTICS INDUCED CONSTIPATION: None  LIVING WILL AND CODE STATUS: ?   INTERVAL HISTORY: Curly Mackowski 73 y.o. male returns to the clinic today for followup visit accompanied by his wife. Unfortunately the patient lost his son few days ago. He has been a little bit depressed and has lack of appetite. He is feeling fine otherwise except for mild pain rated as 2/10 on a scale from 1-10 located in the left flank area. He denied having any significant chest pain, shortness of breath, cough or hemoptysis. He lost few pounds recently. He denied having any significant fever or chills, no nausea or vomiting. The patient had repeat CT scan of the chest, abdomen and pelvis performed recently and he is here for evaluation and discussion of his scan results.  MEDICAL HISTORY: Past Medical History  Diagnosis Date  . Hypertension   .  GERD (gastroesophageal reflux disease)   . Alcohol abuse   . 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 Low sodium   . Cancer     small cell/lung  . Persistent atrial fibrillation 04/11/14    chads2vasc score is 2  . Hypotension   . Atrial fibrillation with RVR   . HCAP (healthcare-associated pneumonia)   . Adrenal hemorrhage   . BPH (benign prostatic hyperplasia)   . Hyponatremia   . Anemia of chronic disease   . Fall   . Alcohol intoxication   . Pulmonary nodules   . Adrenal mass   . Atrial flutter by electrocardiogram   . Neoplastic malignant related fatigue   . 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 Alcoholism in remission     ALLERGIES:  has No Known Allergies.  MEDICATIONS:  Current Outpatient Prescriptions  Medication Sig Dispense Refill  . ALPRAZolam (XANAX) 0.25 MG tablet Take 0.25 mg by mouth 3 (three) times daily as needed for anxiety or sleep.    [name] apixaban (ELIQUIS) 5 MG TABS tablet Take 1 tablet (5 mg total) by mouth 2 (two) times daily. 180 tablet 1  . Ascorbic Acid (VITAMIN C PO) Take 1,000 mg by mouth daily.     . B Complex-C (SUPER B COMPLEX PO) Take 1 each by mouth daily.     . Biotin 7500 MCG TABS Take 1 tablet by mouth daily.     . calcium carbonate (OS-CAL) 600 MG TABS Take 600 mg by mouth daily with breakfast.    . demeclocycline (DECLOMYCIN) 150 MG tablet Take 150 mg by mouth  2 (two) times daily.    [name] dexamethasone (DECADRON) 4 MG tablet Take 4 mg by mouth See admin instructions. Take 1 tablet twice a day the day before, day of, and day after chemotherapy every three weeks.    [name] diltiazem (CARDIZEM CD) 240 MG 24 hr capsule Take 1 capsule (240 mg total) by mouth daily at 12 noon. 90 capsule 3  . furosemide (LASIX) 40 MG tablet Take 1 tablet (40 mg total) by mouth 2 (two) times daily. 30 tablet 3  . HYDROcodone-acetaminophen (NORCO/VICODIN) 5-325 MG per tablet Take 1-2 tablets by mouth every 4 (four) hours as needed (pain).    [name] KLOR-CON  M20 20 MEQ tablet     . Lactobacillus (ACIDOPHILUS) CAPS capsule Take 1 capsule by mouth daily.    [name] MAGNESIUM PO Take 400 mg by mouth daily.     . methylPREDNIsolone (MEDROL DOSPACK) 4 MG tablet Take 4 mg by mouth as directed. follow package directions    . metoprolol tartrate (LOPRESSOR) 25 MG tablet Take 1 tablet (25 mg total) by mouth 2 (two) times daily. 180 tablet 1  . mirtazapine (REMERON) 15 MG tablet Take 1 tablet (15 mg total) by mouth at bedtime. 30 tablet 0  . Multiple Vitamin (MULTIVITAMIN WITH MINERALS) TABS Take 1 tablet by mouth daily.    . Nutritional Supplements (ENSURE COMPLETE PO) Take 1 Can by mouth 2 (two) times daily.    . Omega-3 Fatty Acids (FISH OIL PO) Take 1 tablet by mouth daily.    [name] omeprazole (PRILOSEC) 20 MG capsule Take 20 mg by mouth daily as needed (heartburn).     . Potassium Chloride ER 20 MEQ TBCR Take 20 mEq by mouth as directed. 1st day take 4 tabs total = 80 meq; starting on day 2 take 1 tab twice daily 65 tablet 6  . PRESCRIPTION MEDICATION Chemo at [facility]    . tamsulosin (FLOMAX) 0.4 MG CAPS capsule Take 0.4 mg by mouth daily.     [name] thiamine (VITAMIN B-1) 100 MG tablet Take 1 tablet (100 mg total) by mouth daily. 30 tablet 6  . vitamin E 400 UNIT capsule Take 400 Units by mouth daily.     No current facility-administered medications for this visit.    SURGICAL HISTORY:  Past Surgical History  Procedure Laterality Date  . Hemorrhoid surgery    . Ankle arthrodesis  1995    rt fx-hardware in  . Tonsillectomy    . Colonoscopy    . Excision / curettage bone cyst finger  2005    rt thumb  . Total shoulder arthroplasty  02/03/2012    Procedure: TOTAL SHOULDER ARTHROPLASTY;  Surgeon: [name], MD;  Location: [place];  Service: Orthopedics;  Laterality: Left;  . Cardioversion N/A 09/29/2014    Procedure: CARDIOVERSION;  Surgeon: Josue Hector, MD;  Location: [facility] ENDOSCOPY;  Service: Cardiovascular;  Laterality: N/A;    REVIEW  OF SYSTEMS:  Constitutional: positive for fatigue Eyes: negative Ears, nose, mouth, throat, and face: negative Respiratory: negative Cardiovascular: negative Gastrointestinal: negative Genitourinary:negative Integument/breast: negative Hematologic/lymphatic: negative Musculoskeletal:negative Neurological: negative Behavioral/Psych: negative Endocrine: negative Allergic/Immunologic: negative   PHYSICAL EXAMINATION: General appearance: alert, cooperative, fatigued and no distress Head: Normocephalic, without obvious abnormality, atraumatic Neck: no adenopathy, no JVD, supple, symmetrical, trachea midline and thyroid not enlarged, symmetric, no tenderness/mass/nodules Lymph nodes: Cervical, supraclavicular, and axillary nodes normal. Resp: clear to auscultation bilaterally Back: symmetric, no curvature. ROM normal. No CVA tenderness. Cardio: regular rate  and rhythm, S1, S2 normal, no murmur, click, rub or gallop GI: soft, non-tender; bowel sounds normal; no masses,  no organomegaly Extremities: extremities normal, atraumatic, no cyanosis or edema Neurologic: Alert and oriented X 3, normal strength and tone. Normal symmetric reflexes. Normal coordination and gait  ECOG PERFORMANCE STATUS: 1 - Symptomatic but completely ambulatory  Blood pressure 135/86, pulse 65, temperature 97.8 F (36.6 C), temperature source Oral, resp. rate 18, height 6' (1.829 m), weight 153 lb 14.4 oz (69.809 kg), SpO2 99 %.  LABORATORY DATA: Lab Results  Component Value Date   WBC 4.0 11/10/2014   HGB 13.2 11/10/2014   HCT 38.3* 11/10/2014   MCV 93.2 11/10/2014   PLT 205 11/10/2014      Chemistry      Component Value Date/Time   NA 135* 11/10/2014 1423   NA 132* 09/29/2014 1115   K 3.4* 11/10/2014 1423   K 3.3* 09/29/2014 1115   CL 91* 09/22/2014 1122   CO2 28 11/10/2014 1423   CO2 30 09/22/2014 1122   BUN 10.1 11/10/2014 1423   BUN 21 09/22/2014 1122   CREATININE 0.7 11/10/2014 1423    CREATININE 0.8 09/22/2014 1122      Component Value Date/Time   CALCIUM 9.2 11/10/2014 1423   CALCIUM 8.9 09/22/2014 1122   ALKPHOS 101 11/10/2014 1423   ALKPHOS 100 09/02/2014 1400   AST 22 11/10/2014 1423   AST 25 09/02/2014 1400   ALT 11 11/10/2014 1423   ALT 15 09/02/2014 1400   BILITOT 0.57 11/10/2014 1423   BILITOT 0.8 09/02/2014 1400       RADIOGRAPHIC STUDIES: Ct Chest W Contrast  11/11/2014   CLINICAL DATA:  Restaging for right lower lobe lung carcinoma. Currently undergoing chemotherapy.  EXAM: CT CHEST, ABDOMEN, AND PELVIS WITH CONTRAST  TECHNIQUE: Multidetector CT imaging of the chest, abdomen and pelvis was performed following the standard protocol during bolus administration of intravenous contrast.  CONTRAST:  122m OMNIPAQUE IOHEXOL 300 MG/ML  SOLN  COMPARISON:  06/02/2014  FINDINGS: CT CHEST FINDINGS  Mediastinum/Hilar Regions: No masses or pathologically enlarged lymph nodes identified.  Other Thoracic Lymphadenopathy:  None.  Lungs: Multiple ground-glass nodules in both upper lobes show no significant change compared to prior exam. Spiculated nodule in the medial left lung apex on image 10 currently measures 11 x 17 mm, and is also not significant changed in size. Mild to moderate emphysema again noted.  Pleura:  Small right pleural effusion shows mild decrease in size.  Vascular/Cardiac:  No acute findings identified.  Other:  None  Musculoskeletal: No suspicious bone lesions identified. Multiple old right rib fracture deformities again noted.  CT ABDOMEN AND PELVIS FINDINGS  Hepatobiliary: Probable tiny sub-cm hepatic cyst remains stable. Tiny sub-cm hypervascular nodules in the anterior liver dome and left hepatic lobe on image 57 of series 2 are also stable and likely benign. No new or enlarging liver masses are identified. Cholelithiasis is demonstrated, without evidence of cholecystitis or biliary dilatation.  Pancreas: No mass, inflammatory changes, or other parenchymal  abnormality identified.  Spleen:  Within normal limits in size and appearance.  Adrenal Glands: Left adrenal mass is increased in size, currently measuring 2.5 x 3.7 cm on image 68 compared to 2.4 x 2.5 cm previously. No right adrenal mass identified.  Kidneys/Urinary Tract: No masses identified. No evidence of hydronephrosis. Urinary bladder is enlarged and shows wall diverticula, consistent with chronic bladder outlet obstruction.  Stomach/Bowel/Peritoneum: No evidence of wall thickening, mass, or obstruction.  Vascular/Lymphatic: No pathologically enlarged lymph nodes identified. No other significant abnormality identified.  Reproductive:  No mass or other significant abnormality identified.  Other:  None.  Musculoskeletal:  No suspicious bone lesions identified.  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 No new sites of metastatic disease identified.  Stable 1.7cm spiculated nodule in the medial left lung apex. Multiple other bilateral upper lobe predominant ground-glass nodules are also unchanged.  Mild decrease in right pleural effusion.  Stable probably benign sub-cm hypervascular lesions and cysts within the liver.  Cholelithiasis.  No radiographic evidence of cholecystitis.   Electronically Signed   By: Earle Gell M.D.   On: 11/11/2014 09:38   Ct Abdomen Pelvis W Contrast  11/11/2014   CLINICAL DATA:  Restaging for right lower lobe lung carcinoma. Currently undergoing chemotherapy.  EXAM: CT CHEST, ABDOMEN, AND PELVIS WITH CONTRAST  TECHNIQUE: Multidetector CT imaging of the chest, abdomen and pelvis was performed following the standard protocol during bolus administration of intravenous contrast.  CONTRAST:  123m OMNIPAQUE IOHEXOL 300 MG/ML  SOLN  COMPARISON:  06/02/2014  FINDINGS: CT CHEST FINDINGS  Mediastinum/Hilar Regions: No masses or pathologically enlarged lymph nodes identified.  Other Thoracic Lymphadenopathy:  None.  Lungs: Multiple ground-glass nodules in both upper  lobes show no significant change compared to prior exam. Spiculated nodule in the medial left lung apex on image 10 currently measures 11 x 17 mm, and is also not significant changed in size. Mild to moderate emphysema again noted.  Pleura:  Small right pleural effusion shows mild decrease in size.  Vascular/Cardiac:  No acute findings identified.  Other:  None  Musculoskeletal: No suspicious bone lesions identified. Multiple old right rib fracture deformities again noted.  CT ABDOMEN AND PELVIS FINDINGS  Hepatobiliary: Probable tiny sub-cm hepatic cyst remains stable. Tiny sub-cm hypervascular nodules in the anterior liver dome and left hepatic lobe on image 57 of series 2 are also stable and likely benign. No new or enlarging liver masses are identified. Cholelithiasis is demonstrated, without evidence of cholecystitis or biliary dilatation.  Pancreas: No mass, inflammatory changes, or other parenchymal abnormality identified.  Spleen:  Within normal limits in size and appearance.  Adrenal Glands: Left adrenal mass is increased in size, currently measuring 2.5 x 3.7 cm on image 68 compared to 2.4 x 2.5 cm previously. No right adrenal mass identified.  Kidneys/Urinary Tract: No masses identified. No evidence of hydronephrosis. Urinary bladder is enlarged and shows wall diverticula, consistent with chronic bladder outlet obstruction.  Stomach/Bowel/Peritoneum: No evidence of wall thickening, mass, or obstruction.  Vascular/Lymphatic: No pathologically enlarged lymph nodes identified. No other significant abnormality identified.  Reproductive:  No mass or other significant abnormality identified.  Other:  None.  Musculoskeletal:  No suspicious bone lesions identified.  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 No new sites of metastatic disease identified.  Stable 1.7cm spiculated nodule in the medial left lung apex. Multiple other bilateral upper lobe predominant ground-glass nodules are also  unchanged.  Mild decrease in right pleural effusion.  Stable probably benign sub-cm hypervascular lesions and cysts within the liver.  Cholelithiasis.  No radiographic evidence of cholecystitis.   Electronically Signed   By: JEarle GellM.D.   On: 11/11/2014 09:38   ASSESSMENT AND PLAN: This is a very pleasant 73years old white male with:  1)  stage IV non-small cell lung cancer, adenocarcinoma status post induction chemotherapy with carboplatin and Alimta and currently on maintenance treatment with single agent Alimta  status post 8 cycles. He has been observation for the last few months. The recent CT scan of the chest, abdomen and pelvis showed stable disease except for mild increase in the left adrenal gland. I discussed the scan results and showed the images to the patient and his wife. I discussed with the patient several treatment options including systemic chemotherapy versus the course of palliative radiotherapy to the left adrenal gland followed by close monitoring and repeat scan. The patient is interested in proceeding with the palliative radiotherapy. I would refer him to Dr. Sondra Come with radiation oncology for evaluation. I will continue to monitor him closely with repeat CT scan of the chest, abdomen and pelvis in 2 months. The patient and his wife agreed to the current plan.  2) right pleural effusion: Will continue to monitor and drain the fluid as needed  3) atrial fibrillation: He'll continue his current medication with Cardizem, digoxin and metoprolol in addition to Eliquis. He'll also continue his routine followup visit with his cardiologist Dr. [name].  He was advised to call immediately if he has any concerning symptoms in the interval. The patient voices understanding of current disease status and treatment options and is in agreement with the current care plan.  All questions were answered. The patient knows to call the clinic with any problems, questions or concerns. We  can certainly see the patient much sooner if necessary.  Disclaimer: This note was dictated with voice recognition software. Similar sounding words can inadvertently be transcribed and may not be corrected upon review.

## 2014-11-19 ENCOUNTER — Ambulatory Visit
Admission: RE | Admit: 2014-11-19 | Discharge: 2014-11-19 | Disposition: A | Discharge status: Home / Self Care | Payer: Medicare Other | Source: Ambulatory Visit | Attending: Radiation Oncology | Admitting: Radiation Oncology

## 2014-11-19 ENCOUNTER — Encounter: Payer: Self-pay | Admitting: Nutrition

## 2014-11-19 ENCOUNTER — Other Ambulatory Visit: Payer: Self-pay | Admitting: *Deleted

## 2014-11-19 ENCOUNTER — Ambulatory Visit: Payer: Medicare Other

## 2014-11-19 DIAGNOSIS — Z7952 Long term (current) use of systemic steroids: Secondary | ICD-10-CM | POA: Insufficient documentation

## 2014-11-19 DIAGNOSIS — Z792 Long term (current) use of antibiotics: Secondary | ICD-10-CM | POA: Insufficient documentation

## 2014-11-19 DIAGNOSIS — Z7901 Long term (current) use of anticoagulants: Secondary | ICD-10-CM | POA: Insufficient documentation

## 2014-11-19 DIAGNOSIS — C7972 Secondary malignant neoplasm of left adrenal gland: Secondary | ICD-10-CM | POA: Insufficient documentation

## 2014-11-19 DIAGNOSIS — Z9221 Personal history of antineoplastic chemotherapy: Secondary | ICD-10-CM | POA: Insufficient documentation

## 2014-11-19 DIAGNOSIS — C3431 Malignant neoplasm of lower lobe, right bronchus or lung: Secondary | ICD-10-CM | POA: Insufficient documentation

## 2014-11-19 DIAGNOSIS — Z510 Encounter for antineoplastic radiation therapy: Secondary | ICD-10-CM | POA: Insufficient documentation

## 2014-11-19 MED ORDER — HYDROCODONE-ACETAMINOPHEN 5-325 MG PO TABS: 1.0000 | ORAL_TABLET | ORAL | Status: DC | PRN

## 2014-11-19 NOTE — Progress Notes (Signed)
Provided one complimentary case of Ensure Plus for patient.

## 2014-11-19 NOTE — Progress Notes (Signed)
Histology and Location of Primary Cancer: Stage IV (T1 A., N0, M1 B.) non-small cell lung cancer   Location(s) of Symptomatic tumor(s): Enlarging left adrenal gland met.  Past/Anticipated chemotherapy by medical oncology, if any: Systemic chemotherapy with carboplatin for AUC of 5 and Alimta 500 mg/M2 every 3 weeks, status post 6 cycles with stable disease. First dose on 10/23/2013. Maintenance systemic chemotherapy with single agent Alimta 500 mg/M2 every 3 weeks. Status post 8 cycles  Patient's main complaints related to symptomatic tumor(s) are: pain in his left flank area especially when laying down at night.  He takes Norco 1-2 tablets at night.    Pain on a scale of 0-10 is: 3  Ambulatory status? Walker? Wheelchair?: ambulatory  SAFETY ISSUES:  Prior radiation? no  Pacemaker/ICD? no  Possible current pregnancy? no  Is the patient on methotrexate? no  Additional Complaints / other details: Patient is here with his wife and daughter.  He has 3 children.  He is retired from working with [employer].

## 2014-11-20 ENCOUNTER — Ambulatory Visit
Admission: RE | Admit: 2014-11-20 | Discharge: 2014-11-20 | Disposition: A | Discharge status: Home / Self Care | Payer: Medicare Other | Source: Ambulatory Visit | Attending: Radiation Oncology | Admitting: Radiation Oncology

## 2014-11-20 ENCOUNTER — Encounter: Payer: Self-pay | Admitting: Radiation Oncology

## 2014-11-20 VITALS — BP 129/65 | HR 67 | Temp 97.7°F | Resp 20 | Ht 72.0 in | Wt 156.4 lb

## 2014-11-20 DIAGNOSIS — E279 Disorder of adrenal gland, unspecified: Secondary | ICD-10-CM | POA: Diagnosis not present

## 2014-11-20 DIAGNOSIS — Z510 Encounter for antineoplastic radiation therapy: Secondary | ICD-10-CM | POA: Diagnosis not present

## 2014-11-20 DIAGNOSIS — C3431 Malignant neoplasm of lower lobe, right bronchus or lung: Secondary | ICD-10-CM | POA: Diagnosis not present

## 2014-11-20 DIAGNOSIS — Z7952 Long term (current) use of systemic steroids: Secondary | ICD-10-CM | POA: Diagnosis not present

## 2014-11-20 DIAGNOSIS — Z9221 Personal history of antineoplastic chemotherapy: Secondary | ICD-10-CM | POA: Diagnosis not present

## 2014-11-20 DIAGNOSIS — Z792 Long term (current) use of antibiotics: Secondary | ICD-10-CM | POA: Diagnosis not present

## 2014-11-20 DIAGNOSIS — Z7901 Long term (current) use of anticoagulants: Secondary | ICD-10-CM | POA: Diagnosis not present

## 2014-11-20 DIAGNOSIS — E278 Other specified disorders of adrenal gland: Secondary | ICD-10-CM

## 2014-11-20 DIAGNOSIS — C7972 Secondary malignant neoplasm of left adrenal gland: Secondary | ICD-10-CM | POA: Diagnosis not present

## 2014-11-20 NOTE — Progress Notes (Signed)
Radiation Oncology         [phone] ________________________________  Name: Anthony Curry MRN: [id number]  Date: 11/20/2014  DOB: January 13, 1941   Note  CC: [name], MD  [name], MD    ICD-9-CM ICD-10-CM   1. Adrenal mass 255.9 E27.9   2. Malignant neoplasm of lower lobe of right lung 162.5 C34.31     Diagnosis: Stage IV (T1 A., N0, M1 B.) non-small cell lung cancer    Narrative:  The patient returns today for further evaluation at the courtesy of Dr. [name]. The patient was initially seen in the multidisciplinary thoracic oncology clinic. At that time the patient did not have any symptoms that warranted palliative radiation therapy. The patient has received systemic chemotherapy with carboplatinum and Alimta as well as maintenance systemic chemotherapy with single agent Alimta. Over the past 2 weeks patient has had some pain in the left flank. 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 In light of his pain and x-ray findings he is referred to radiation oncology for consideration of palliative treatment.                             ALLERGIES:  has No Known Allergies.  Meds: Current Outpatient Prescriptions  Medication Sig Dispense Refill  . ALPRAZolam (XANAX) 0.25 MG tablet Take 0.25 mg by mouth 3 (three) times daily as needed for anxiety or sleep.    [name] apixaban (ELIQUIS) 5 MG TABS tablet Take 1 tablet (5 mg total) by mouth 2 (two) times daily. 180 tablet 1  . Ascorbic Acid (VITAMIN C PO) Take 1,000 mg by mouth daily.     . B Complex-C (SUPER B COMPLEX PO) Take 1 each by mouth daily.     . Biotin 7500 MCG TABS Take 1 tablet by mouth daily.     . calcium carbonate (OS-CAL) 600 MG TABS Take 600 mg by mouth daily with breakfast.    . demeclocycline (DECLOMYCIN) 150 MG tablet Take 150 mg by mouth 2 (two) times daily.    [name] dexamethasone (DECADRON) 4 MG tablet Take 4 mg by mouth See  admin instructions. Take 1 tablet twice a day the day before, day of, and day after chemotherapy every three weeks.    [name] diltiazem (CARDIZEM CD) 240 MG 24 hr capsule Take 1 capsule (240 mg total) by mouth daily at 12 noon. 90 capsule 3  . furosemide (LASIX) 40 MG tablet Take 1 tablet (40 mg total) by mouth 2 (two) times daily. 30 tablet 3  . HYDROcodone-acetaminophen (NORCO/VICODIN) 5-325 MG per tablet Take 1-2 tablets by mouth every 4 (four) hours as needed (pain). 30 tablet 0  . KLOR-CON M20 20 MEQ tablet     . Lactobacillus (ACIDOPHILUS) CAPS capsule Take 1 capsule by mouth daily.    [name] MAGNESIUM PO Take 400 mg by mouth daily.     . metoprolol tartrate (LOPRESSOR) 25 MG tablet Take 1 tablet (25 mg total) by mouth 2 (two) times daily. 180 tablet 1  . mirtazapine (REMERON) 15 MG tablet Take 1 tablet (15 mg total) by mouth at bedtime. 30 tablet 0  . Multiple Vitamin (MULTIVITAMIN WITH MINERALS) TABS Take 1 tablet by mouth daily.    . Nutritional Supplements (ENSURE COMPLETE PO) Take 1 Can by mouth 2 (two) times daily.    . Omega-3 Fatty Acids (FISH OIL PO) Take 1 tablet by mouth  daily.    . omeprazole (PRILOSEC) 20 MG capsule Take 20 mg by mouth daily as needed (heartburn).     . Potassium Chloride ER 20 MEQ TBCR Take 20 mEq by mouth as directed. 1st day take 4 tabs total = 80 meq; starting on day 2 take 1 tab twice daily 65 tablet 6  . PRESCRIPTION MEDICATION Chemo at [facility]    . tamsulosin (FLOMAX) 0.4 MG CAPS capsule Take 0.4 mg by mouth daily.     [name] thiamine (VITAMIN B-1) 100 MG tablet Take 1 tablet (100 mg total) by mouth daily. 30 tablet 6  . vitamin E 400 UNIT capsule Take 400 Units by mouth daily.     No current facility-administered medications for this encounter.    Physical Findings: The patient is in no acute distress. Patient is alert and oriented.  height is 6' (1.829 m) and weight is 156 lb 6.4 oz (70.943 kg). His oral temperature is 97.7 F (36.5 C). His blood pressure is 129/65  and his pulse is 67. His respiration is 20 and oxygen saturation is 100%. . No palpable supraclavicular or axillary adenopathy. Lungs are clear to auscultation. The heart has a regular rhythm and rate. The abdomen is soft and nontender with normal bowel sounds. On neurological examination motor strength is 5 out of 5 in the proximal and distal muscle groups of the lower extremities.  Lab Findings: Lab Results  Component Value Date   WBC 4.0 11/10/2014   HGB 13.2 11/10/2014   HCT 38.3* 11/10/2014   MCV 93.2 11/10/2014   PLT 205 11/10/2014    Radiographic Findings: Ct Chest W Contrast  11/11/2014   CLINICAL DATA:  Restaging for right lower lobe lung carcinoma. Currently undergoing chemotherapy.  EXAM: CT CHEST, ABDOMEN, AND PELVIS WITH CONTRAST  TECHNIQUE: Multidetector CT imaging of the chest, abdomen and pelvis was performed following the standard protocol during bolus administration of intravenous contrast.  CONTRAST:  133mL OMNIPAQUE IOHEXOL 300 MG/ML  SOLN  COMPARISON:  06/02/2014  FINDINGS: CT CHEST FINDINGS  Mediastinum/Hilar Regions: No masses or pathologically enlarged lymph nodes identified.  Other Thoracic Lymphadenopathy:  None.  Lungs: Multiple ground-glass nodules in both upper lobes show no significant change compared to prior exam. Spiculated nodule in the medial left lung apex on image 10 currently measures 11 x 17 mm, and is also not significant changed in size. Mild to moderate emphysema again noted.  Pleura:  Small right pleural effusion shows mild decrease in size.  Vascular/Cardiac:  No acute findings identified.  Other:  None  Musculoskeletal: No suspicious bone lesions identified. Multiple old right rib fracture deformities again noted.  CT ABDOMEN AND PELVIS FINDINGS  Hepatobiliary: Probable tiny sub-cm hepatic cyst remains stable. Tiny sub-cm hypervascular nodules in the anterior liver dome and left hepatic lobe on image 57 of series 2 are also stable and likely benign. No new  or enlarging liver masses are identified. Cholelithiasis is demonstrated, without evidence of cholecystitis or biliary dilatation.  Pancreas: No mass, inflammatory changes, or other parenchymal abnormality identified.  Spleen:  Within normal limits in size and appearance.  Adrenal Glands: Left adrenal mass is increased in size, currently measuring 2.5 x 3.7 cm on image 68 compared to 2.4 x 2.5 cm previously. No right adrenal mass identified.  Kidneys/Urinary Tract: No masses identified. No evidence of hydronephrosis. Urinary bladder is enlarged and shows wall diverticula, consistent with chronic bladder outlet obstruction.  Stomach/Bowel/Peritoneum: No evidence of wall thickening, mass, or obstruction.  Vascular/Lymphatic:  No pathologically enlarged lymph nodes identified. No other significant abnormality identified.  Reproductive:  No mass or other significant abnormality identified.  Other:  None.  Musculoskeletal:  No suspicious bone lesions identified.  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 No new sites of metastatic disease identified.  Stable 1.7cm spiculated nodule in the medial left lung apex. Multiple other bilateral upper lobe predominant ground-glass nodules are also unchanged.  Mild decrease in right pleural effusion.  Stable probably benign sub-cm hypervascular lesions and cysts within the liver.  Cholelithiasis.  No radiographic evidence of cholecystitis.   Electronically Signed   By: Earle Gell M.D.   On: 11/11/2014 09:38   Ct Abdomen Pelvis W Contrast  11/11/2014   CLINICAL DATA:  Restaging for right lower lobe lung carcinoma. Currently undergoing chemotherapy.  EXAM: CT CHEST, ABDOMEN, AND PELVIS WITH CONTRAST  TECHNIQUE: Multidetector CT imaging of the chest, abdomen and pelvis was performed following the standard protocol during bolus administration of intravenous contrast.  CONTRAST:  185mL OMNIPAQUE IOHEXOL 300 MG/ML  SOLN  COMPARISON:  06/02/2014  FINDINGS: CT CHEST  FINDINGS  Mediastinum/Hilar Regions: No masses or pathologically enlarged lymph nodes identified.  Other Thoracic Lymphadenopathy:  None.  Lungs: Multiple ground-glass nodules in both upper lobes show no significant change compared to prior exam. Spiculated nodule in the medial left lung apex on image 10 currently measures 11 x 17 mm, and is also not significant changed in size. Mild to moderate emphysema again noted.  Pleura:  Small right pleural effusion shows mild decrease in size.  Vascular/Cardiac:  No acute findings identified.  Other:  None  Musculoskeletal: No suspicious bone lesions identified. Multiple old right rib fracture deformities again noted.  CT ABDOMEN AND PELVIS FINDINGS  Hepatobiliary: Probable tiny sub-cm hepatic cyst remains stable. Tiny sub-cm hypervascular nodules in the anterior liver dome and left hepatic lobe on image 57 of series 2 are also stable and likely benign. No new or enlarging liver masses are identified. Cholelithiasis is demonstrated, without evidence of cholecystitis or biliary dilatation.  Pancreas: No mass, inflammatory changes, or other parenchymal abnormality identified.  Spleen:  Within normal limits in size and appearance.  Adrenal Glands: Left adrenal mass is increased in size, currently measuring 2.5 x 3.7 cm on image 68 compared to 2.4 x 2.5 cm previously. No right adrenal mass identified.  Kidneys/Urinary Tract: No masses identified. No evidence of hydronephrosis. Urinary bladder is enlarged and shows wall diverticula, consistent with chronic bladder outlet obstruction.  Stomach/Bowel/Peritoneum: No evidence of wall thickening, mass, or obstruction.  Vascular/Lymphatic: No pathologically enlarged lymph nodes identified. No other significant abnormality identified.  Reproductive:  No mass or other significant abnormality identified.  Other:  None.  Musculoskeletal:  No suspicious bone lesions identified.  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  No new sites of metastatic disease identified.  Stable 1.7cm spiculated nodule in the medial left lung apex. Multiple other bilateral upper lobe predominant ground-glass nodules are also unchanged.  Mild decrease in right pleural effusion.  Stable probably benign sub-cm hypervascular lesions and cysts within the liver.  Cholelithiasis.  No radiographic evidence of cholecystitis.   Electronically Signed   By: Earle Gell M.D.   On: 11/11/2014 09:38    Impression:  Stage IV (T1 A., N0, M1 B.) non-small cell lung cancer.  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 Patient is having some discomfort in the left mid back/flank area and is likely related to his  enlarging adrenal mass. Patient would be a good candidate for a short course of palliative radiation therapy directed to this area. I discussed the treatment course side effects and potential toxicities of radiation therapy in this situation with the patient and his family. Patient appears to understand and wishes to proceed with planned course of treatment.   Plan:  Simulation and planning on December 22 with treatments to begin the week of December 28. Anticipate 2 weeks of radiation therapy.  ____________________________________ Blair Promise, MD

## 2014-11-20 NOTE — Progress Notes (Signed)
Please see the Nurse Progress Note in the MD Initial Consult Encounter for this patient. 

## 2014-11-24 ENCOUNTER — Ambulatory Visit: Payer: Medicare Other | Admitting: Internal Medicine

## 2014-11-25 ENCOUNTER — Ambulatory Visit
Admission: RE | Admit: 2014-11-25 | Discharge: 2014-11-25 | Disposition: A | Discharge status: Home / Self Care | Payer: Medicare Other | Source: Ambulatory Visit | Attending: Radiation Oncology | Admitting: Radiation Oncology

## 2014-11-25 DIAGNOSIS — C3430 Malignant neoplasm of lower lobe, unspecified bronchus or lung: Secondary | ICD-10-CM | POA: Diagnosis not present

## 2014-11-25 DIAGNOSIS — Z510 Encounter for antineoplastic radiation therapy: Secondary | ICD-10-CM | POA: Diagnosis not present

## 2014-11-25 DIAGNOSIS — Z7901 Long term (current) use of anticoagulants: Secondary | ICD-10-CM | POA: Diagnosis not present

## 2014-11-25 DIAGNOSIS — Z9221 Personal history of antineoplastic chemotherapy: Secondary | ICD-10-CM | POA: Diagnosis not present

## 2014-11-25 DIAGNOSIS — Z792 Long term (current) use of antibiotics: Secondary | ICD-10-CM | POA: Diagnosis not present

## 2014-11-25 DIAGNOSIS — C7972 Secondary malignant neoplasm of left adrenal gland: Secondary | ICD-10-CM | POA: Diagnosis not present

## 2014-11-25 DIAGNOSIS — C3431 Malignant neoplasm of lower lobe, right bronchus or lung: Secondary | ICD-10-CM | POA: Diagnosis not present

## 2014-11-25 NOTE — Progress Notes (Signed)
  Radiation Oncology         [phone]) [phone] ________________________________  Name: Anthony Curry MRN: [id number]  Date: 11/25/2014  DOB: 10/08/1941  SIMULATION AND TREATMENT PLANNING NOTE    ICD-9-CM ICD-10-CM   1. Malignant neoplasm of lower lobe of lung, unspecified laterality 162.5 C34.30     DIAGNOSIS: Stage IV (T1 A., N0, M1 B.) non-small cell lung cancerwith enlarging left adrenal mass and flank pain  NARRATIVE:  The patient was brought to the [facility].  Identity was confirmed.  All relevant records and images related to the planned course of therapy were reviewed.  The patient freely provided informed written consent to proceed with treatment after reviewing the details related to the planned course of therapy. The consent form was witnessed and verified by the simulation staff.  Then, the patient was set-up in a stable reproducible  supine position for radiation therapy.  CT images were obtained.  Surface markings were placed.  The CT images were loaded into the planning software.  Then the target and avoidance structures were contoured.  Treatment planning then occurred.  The radiation prescription was entered and confirmed.  Then, I designed and supervised the construction of a total of 1 medically necessary complex treatment devices.  I have requested : 3D Simulation  I have requested a DVH of the following structures: GTV, PTV, kidneys, stomach, small bowet.  I have ordered:dose calc.  PLAN:  The patient will receive 30 Gy in 10 fractions.  ________________________________  -----------------------------------  Blair Promise, PhD, MD

## 2014-11-26 DIAGNOSIS — C3431 Malignant neoplasm of lower lobe, right bronchus or lung: Secondary | ICD-10-CM | POA: Diagnosis not present

## 2014-11-26 DIAGNOSIS — Z792 Long term (current) use of antibiotics: Secondary | ICD-10-CM | POA: Diagnosis not present

## 2014-11-26 DIAGNOSIS — C7972 Secondary malignant neoplasm of left adrenal gland: Secondary | ICD-10-CM | POA: Diagnosis not present

## 2014-11-26 DIAGNOSIS — C3430 Malignant neoplasm of lower lobe, unspecified bronchus or lung: Secondary | ICD-10-CM | POA: Diagnosis not present

## 2014-11-26 DIAGNOSIS — Z510 Encounter for antineoplastic radiation therapy: Secondary | ICD-10-CM | POA: Diagnosis not present

## 2014-11-26 DIAGNOSIS — Z9221 Personal history of antineoplastic chemotherapy: Secondary | ICD-10-CM | POA: Diagnosis not present

## 2014-11-26 DIAGNOSIS — Z7901 Long term (current) use of anticoagulants: Secondary | ICD-10-CM | POA: Diagnosis not present

## 2014-11-27 DIAGNOSIS — Z510 Encounter for antineoplastic radiation therapy: Secondary | ICD-10-CM | POA: Diagnosis not present

## 2014-11-27 DIAGNOSIS — C3431 Malignant neoplasm of lower lobe, right bronchus or lung: Secondary | ICD-10-CM | POA: Diagnosis not present

## 2014-11-27 DIAGNOSIS — Z792 Long term (current) use of antibiotics: Secondary | ICD-10-CM | POA: Diagnosis not present

## 2014-11-27 DIAGNOSIS — C7972 Secondary malignant neoplasm of left adrenal gland: Secondary | ICD-10-CM | POA: Diagnosis not present

## 2014-11-27 DIAGNOSIS — Z9221 Personal history of antineoplastic chemotherapy: Secondary | ICD-10-CM | POA: Diagnosis not present

## 2014-11-27 DIAGNOSIS — Z7901 Long term (current) use of anticoagulants: Secondary | ICD-10-CM | POA: Diagnosis not present

## 2014-11-28 ENCOUNTER — Other Ambulatory Visit: Payer: Self-pay | Admitting: Cardiology

## 2014-11-28 MED ORDER — APIXABAN 5 MG PO TABS: 5.0000 mg | ORAL_TABLET | Freq: Two times a day (BID) | ORAL | Status: DC

## 2014-12-02 ENCOUNTER — Ambulatory Visit
Admission: RE | Admit: 2014-12-02 | Discharge: 2014-12-02 | Disposition: A | Discharge status: Home / Self Care | Payer: Medicare Other | Source: Ambulatory Visit | Attending: Radiation Oncology | Admitting: Radiation Oncology

## 2014-12-02 ENCOUNTER — Encounter: Payer: Self-pay | Admitting: Radiation Oncology

## 2014-12-02 VITALS — BP 158/75 | HR 82 | Resp 16 | Wt 154.9 lb

## 2014-12-02 DIAGNOSIS — Z510 Encounter for antineoplastic radiation therapy: Secondary | ICD-10-CM | POA: Diagnosis not present

## 2014-12-02 DIAGNOSIS — C7972 Secondary malignant neoplasm of left adrenal gland: Secondary | ICD-10-CM | POA: Diagnosis not present

## 2014-12-02 DIAGNOSIS — Z7901 Long term (current) use of anticoagulants: Secondary | ICD-10-CM | POA: Diagnosis not present

## 2014-12-02 DIAGNOSIS — Z792 Long term (current) use of antibiotics: Secondary | ICD-10-CM | POA: Diagnosis not present

## 2014-12-02 DIAGNOSIS — C3430 Malignant neoplasm of lower lobe, unspecified bronchus or lung: Secondary | ICD-10-CM | POA: Diagnosis not present

## 2014-12-02 DIAGNOSIS — C3431 Malignant neoplasm of lower lobe, right bronchus or lung: Secondary | ICD-10-CM | POA: Diagnosis not present

## 2014-12-02 DIAGNOSIS — Z9221 Personal history of antineoplastic chemotherapy: Secondary | ICD-10-CM | POA: Diagnosis not present

## 2014-12-02 NOTE — Progress Notes (Signed)
BP slightly elevated. Weight stable. Denies pain at this time. Reports occasionally at night he will "pop a pain pill to help him sleep" because of left lower back pain. Denies hematuria or dysuria. Denies diarrhea. Productive cough with white sputum. Denies hemoptysis.

## 2014-12-02 NOTE — Progress Notes (Signed)
Weekly Management Note Current Dose: 3 Gy  Projected Dose: 30 Gy   Narrative:  The patient presents for routine under treatment assessment.  CBCT/MVCT images/Port film x-rays were reviewed.  The chart was checked.  Doing well. No complaints.  Physical Findings: Weight: 154 lb 14.4 oz (70.262 kg). Unchanged  Impression:  The patient is tolerating radiation.  Plan:  Continue treatment as planned.RN education was performed.

## 2014-12-03 ENCOUNTER — Ambulatory Visit (HOSPITAL_COMMUNITY): Payer: Medicare Other

## 2014-12-03 ENCOUNTER — Other Ambulatory Visit: Payer: Medicare Other

## 2014-12-03 ENCOUNTER — Ambulatory Visit
Admission: RE | Admit: 2014-12-03 | Discharge: 2014-12-03 | Disposition: A | Discharge status: Home / Self Care | Payer: Medicare Other | Source: Ambulatory Visit | Attending: Radiation Oncology | Admitting: Radiation Oncology

## 2014-12-03 DIAGNOSIS — C3431 Malignant neoplasm of lower lobe, right bronchus or lung: Secondary | ICD-10-CM | POA: Diagnosis not present

## 2014-12-03 DIAGNOSIS — Z510 Encounter for antineoplastic radiation therapy: Secondary | ICD-10-CM | POA: Diagnosis not present

## 2014-12-03 DIAGNOSIS — C7972 Secondary malignant neoplasm of left adrenal gland: Secondary | ICD-10-CM | POA: Diagnosis not present

## 2014-12-03 DIAGNOSIS — Z792 Long term (current) use of antibiotics: Secondary | ICD-10-CM | POA: Diagnosis not present

## 2014-12-03 DIAGNOSIS — C3430 Malignant neoplasm of lower lobe, unspecified bronchus or lung: Secondary | ICD-10-CM | POA: Diagnosis not present

## 2014-12-03 DIAGNOSIS — Z9221 Personal history of antineoplastic chemotherapy: Secondary | ICD-10-CM | POA: Diagnosis not present

## 2014-12-03 DIAGNOSIS — Z7901 Long term (current) use of anticoagulants: Secondary | ICD-10-CM | POA: Diagnosis not present

## 2014-12-04 ENCOUNTER — Ambulatory Visit
Admission: RE | Admit: 2014-12-04 | Discharge: 2014-12-04 | Disposition: A | Discharge status: Home / Self Care | Payer: Medicare Other | Source: Ambulatory Visit | Attending: Radiation Oncology | Admitting: Radiation Oncology

## 2014-12-04 DIAGNOSIS — C3430 Malignant neoplasm of lower lobe, unspecified bronchus or lung: Secondary | ICD-10-CM | POA: Diagnosis not present

## 2014-12-04 DIAGNOSIS — Z510 Encounter for antineoplastic radiation therapy: Secondary | ICD-10-CM | POA: Diagnosis not present

## 2014-12-04 DIAGNOSIS — Z9221 Personal history of antineoplastic chemotherapy: Secondary | ICD-10-CM | POA: Diagnosis not present

## 2014-12-04 DIAGNOSIS — Z7901 Long term (current) use of anticoagulants: Secondary | ICD-10-CM | POA: Diagnosis not present

## 2014-12-04 DIAGNOSIS — C7972 Secondary malignant neoplasm of left adrenal gland: Secondary | ICD-10-CM | POA: Diagnosis not present

## 2014-12-04 DIAGNOSIS — C3431 Malignant neoplasm of lower lobe, right bronchus or lung: Secondary | ICD-10-CM | POA: Diagnosis not present

## 2014-12-04 DIAGNOSIS — Z792 Long term (current) use of antibiotics: Secondary | ICD-10-CM | POA: Diagnosis not present

## 2014-12-08 ENCOUNTER — Ambulatory Visit: Payer: Medicare Other | Admitting: Internal Medicine

## 2014-12-08 ENCOUNTER — Ambulatory Visit
Admission: RE | Admit: 2014-12-08 | Discharge: 2014-12-08 | Disposition: A | Discharge status: Home / Self Care | Payer: Medicare Other | Source: Ambulatory Visit | Attending: Radiation Oncology | Admitting: Radiation Oncology

## 2014-12-08 DIAGNOSIS — Z792 Long term (current) use of antibiotics: Secondary | ICD-10-CM | POA: Diagnosis not present

## 2014-12-08 DIAGNOSIS — C3431 Malignant neoplasm of lower lobe, right bronchus or lung: Secondary | ICD-10-CM | POA: Diagnosis not present

## 2014-12-08 DIAGNOSIS — C7972 Secondary malignant neoplasm of left adrenal gland: Secondary | ICD-10-CM | POA: Diagnosis not present

## 2014-12-08 DIAGNOSIS — C3430 Malignant neoplasm of lower lobe, unspecified bronchus or lung: Secondary | ICD-10-CM | POA: Diagnosis not present

## 2014-12-08 DIAGNOSIS — Z7901 Long term (current) use of anticoagulants: Secondary | ICD-10-CM | POA: Diagnosis not present

## 2014-12-08 DIAGNOSIS — Z7952 Long term (current) use of systemic steroids: Secondary | ICD-10-CM | POA: Diagnosis not present

## 2014-12-08 DIAGNOSIS — Z510 Encounter for antineoplastic radiation therapy: Secondary | ICD-10-CM | POA: Diagnosis not present

## 2014-12-08 DIAGNOSIS — Z9221 Personal history of antineoplastic chemotherapy: Secondary | ICD-10-CM | POA: Diagnosis not present

## 2014-12-09 ENCOUNTER — Encounter: Payer: Self-pay | Admitting: Radiation Oncology

## 2014-12-09 ENCOUNTER — Ambulatory Visit
Admission: RE | Admit: 2014-12-09 | Discharge: 2014-12-09 | Disposition: A | Discharge status: Home / Self Care | Payer: Medicare Other | Source: Ambulatory Visit | Attending: Radiation Oncology | Admitting: Radiation Oncology

## 2014-12-09 VITALS — BP 134/69 | HR 70 | Temp 97.8°F | Resp 16 | Ht 72.0 in | Wt 155.1 lb

## 2014-12-09 DIAGNOSIS — C7972 Secondary malignant neoplasm of left adrenal gland: Secondary | ICD-10-CM | POA: Diagnosis not present

## 2014-12-09 DIAGNOSIS — C3431 Malignant neoplasm of lower lobe, right bronchus or lung: Secondary | ICD-10-CM

## 2014-12-09 DIAGNOSIS — Z9221 Personal history of antineoplastic chemotherapy: Secondary | ICD-10-CM | POA: Diagnosis not present

## 2014-12-09 DIAGNOSIS — C3430 Malignant neoplasm of lower lobe, unspecified bronchus or lung: Secondary | ICD-10-CM | POA: Diagnosis not present

## 2014-12-09 DIAGNOSIS — Z510 Encounter for antineoplastic radiation therapy: Secondary | ICD-10-CM | POA: Diagnosis not present

## 2014-12-09 DIAGNOSIS — Z7901 Long term (current) use of anticoagulants: Secondary | ICD-10-CM | POA: Diagnosis not present

## 2014-12-09 DIAGNOSIS — Z792 Long term (current) use of antibiotics: Secondary | ICD-10-CM | POA: Diagnosis not present

## 2014-12-09 NOTE — Progress Notes (Signed)
  Radiation Oncology         [phone] ________________________________  Name: Ej Pinson MRN: [id number]  Date: 12/09/2014  DOB: January 05, 1941  Weekly Radiation Therapy Management  DIAGNOSIS: Stage IV (T1 A., N0, M1 B.) non-small cell lung cancerwith enlarging left adrenal mass and flank pain  Current Dose: 15 Gy     Planned Dose:  30 Gy  Narrative . . . . . . . . The patient presents for routine under treatment assessment.                                   The patient is without complaint. He denies any nausea or fatigue. His pain seems to be slightly better at this time.                                 Set-up films were reviewed.                                 The chart was checked. Physical Findings. . .  height is 6' (1.829 m) and weight is 155 lb 1.6 oz (70.353 kg). His oral temperature is 97.8 F (36.6 C). His blood pressure is 134/69 and his pulse is 70. His respiration is 16. . Weight essentially stable.  No significant changes. Impression . . . . . . . The patient is tolerating radiation. Plan . . . . . . . . . . . . Continue treatment as planned.  ________________________________   Blair Promise, PhD, MD

## 2014-12-09 NOTE — Progress Notes (Signed)
Anthony Curry has compelted 5 fractions to his left adrenal gland.  He denies pain now.  He reports having pain in his left lower hip area when he lays down at night.  He takes 1 norco tablet at night.  He reports having trouble sleeping.  He is taking 1 xanax at night that usually does not start working until 4 am.  He denies nausea and reports his appetite comes and goes.  His weight is stable.  He was given the Radiation Therapy and You book and discussed potential side effects including fatigue, nausea and loss of appetite.

## 2014-12-10 ENCOUNTER — Ambulatory Visit
Admission: RE | Admit: 2014-12-10 | Discharge: 2014-12-10 | Disposition: A | Discharge status: Home / Self Care | Payer: Medicare Other | Source: Ambulatory Visit | Attending: Radiation Oncology | Admitting: Radiation Oncology

## 2014-12-10 DIAGNOSIS — Z792 Long term (current) use of antibiotics: Secondary | ICD-10-CM | POA: Diagnosis not present

## 2014-12-10 DIAGNOSIS — Z9221 Personal history of antineoplastic chemotherapy: Secondary | ICD-10-CM | POA: Diagnosis not present

## 2014-12-10 DIAGNOSIS — C7972 Secondary malignant neoplasm of left adrenal gland: Secondary | ICD-10-CM | POA: Diagnosis not present

## 2014-12-10 DIAGNOSIS — C3430 Malignant neoplasm of lower lobe, unspecified bronchus or lung: Secondary | ICD-10-CM | POA: Diagnosis not present

## 2014-12-10 DIAGNOSIS — Z510 Encounter for antineoplastic radiation therapy: Secondary | ICD-10-CM | POA: Diagnosis not present

## 2014-12-10 DIAGNOSIS — Z7901 Long term (current) use of anticoagulants: Secondary | ICD-10-CM | POA: Diagnosis not present

## 2014-12-10 DIAGNOSIS — C3431 Malignant neoplasm of lower lobe, right bronchus or lung: Secondary | ICD-10-CM | POA: Diagnosis not present

## 2014-12-11 ENCOUNTER — Encounter: Payer: Self-pay | Admitting: Nutrition

## 2014-12-11 ENCOUNTER — Ambulatory Visit
Admission: RE | Admit: 2014-12-11 | Discharge: 2014-12-11 | Disposition: A | Discharge status: Home / Self Care | Payer: Medicare Other | Source: Ambulatory Visit | Attending: Radiation Oncology | Admitting: Radiation Oncology

## 2014-12-11 DIAGNOSIS — Z7901 Long term (current) use of anticoagulants: Secondary | ICD-10-CM | POA: Diagnosis not present

## 2014-12-11 DIAGNOSIS — Z9221 Personal history of antineoplastic chemotherapy: Secondary | ICD-10-CM | POA: Diagnosis not present

## 2014-12-11 DIAGNOSIS — Z792 Long term (current) use of antibiotics: Secondary | ICD-10-CM | POA: Diagnosis not present

## 2014-12-11 DIAGNOSIS — C7972 Secondary malignant neoplasm of left adrenal gland: Secondary | ICD-10-CM | POA: Diagnosis not present

## 2014-12-11 DIAGNOSIS — Z510 Encounter for antineoplastic radiation therapy: Secondary | ICD-10-CM | POA: Diagnosis not present

## 2014-12-11 DIAGNOSIS — C3431 Malignant neoplasm of lower lobe, right bronchus or lung: Secondary | ICD-10-CM | POA: Diagnosis not present

## 2014-12-11 DIAGNOSIS — C3430 Malignant neoplasm of lower lobe, unspecified bronchus or lung: Secondary | ICD-10-CM | POA: Diagnosis not present

## 2014-12-11 NOTE — Progress Notes (Signed)
Provided second case of Ensure Plus.

## 2014-12-12 ENCOUNTER — Other Ambulatory Visit: Payer: Self-pay | Admitting: *Deleted

## 2014-12-12 ENCOUNTER — Ambulatory Visit
Admission: RE | Admit: 2014-12-12 | Discharge: 2014-12-12 | Disposition: A | Discharge status: Home / Self Care | Payer: Medicare Other | Source: Ambulatory Visit | Attending: Radiation Oncology | Admitting: Radiation Oncology

## 2014-12-12 DIAGNOSIS — C7972 Secondary malignant neoplasm of left adrenal gland: Secondary | ICD-10-CM | POA: Diagnosis not present

## 2014-12-12 DIAGNOSIS — C3430 Malignant neoplasm of lower lobe, unspecified bronchus or lung: Secondary | ICD-10-CM | POA: Diagnosis not present

## 2014-12-12 DIAGNOSIS — Z9221 Personal history of antineoplastic chemotherapy: Secondary | ICD-10-CM | POA: Diagnosis not present

## 2014-12-12 DIAGNOSIS — C3431 Malignant neoplasm of lower lobe, right bronchus or lung: Secondary | ICD-10-CM | POA: Diagnosis not present

## 2014-12-12 DIAGNOSIS — Z7901 Long term (current) use of anticoagulants: Secondary | ICD-10-CM | POA: Diagnosis not present

## 2014-12-12 DIAGNOSIS — Z792 Long term (current) use of antibiotics: Secondary | ICD-10-CM | POA: Diagnosis not present

## 2014-12-12 DIAGNOSIS — Z510 Encounter for antineoplastic radiation therapy: Secondary | ICD-10-CM | POA: Diagnosis not present

## 2014-12-12 MED ORDER — APIXABAN 5 MG PO TABS: 5.0000 mg | ORAL_TABLET | Freq: Two times a day (BID) | ORAL | Status: DC

## 2014-12-15 ENCOUNTER — Ambulatory Visit
Admission: RE | Admit: 2014-12-15 | Discharge: 2014-12-15 | Disposition: A | Discharge status: Home / Self Care | Payer: Medicare Other | Source: Ambulatory Visit | Attending: Radiation Oncology | Admitting: Radiation Oncology

## 2014-12-15 DIAGNOSIS — C7972 Secondary malignant neoplasm of left adrenal gland: Secondary | ICD-10-CM | POA: Diagnosis not present

## 2014-12-15 DIAGNOSIS — Z510 Encounter for antineoplastic radiation therapy: Secondary | ICD-10-CM | POA: Diagnosis not present

## 2014-12-15 DIAGNOSIS — C3431 Malignant neoplasm of lower lobe, right bronchus or lung: Secondary | ICD-10-CM | POA: Diagnosis not present

## 2014-12-15 DIAGNOSIS — Z7901 Long term (current) use of anticoagulants: Secondary | ICD-10-CM | POA: Diagnosis not present

## 2014-12-15 DIAGNOSIS — Z9221 Personal history of antineoplastic chemotherapy: Secondary | ICD-10-CM | POA: Diagnosis not present

## 2014-12-15 DIAGNOSIS — Z792 Long term (current) use of antibiotics: Secondary | ICD-10-CM | POA: Diagnosis not present

## 2014-12-15 DIAGNOSIS — C3430 Malignant neoplasm of lower lobe, unspecified bronchus or lung: Secondary | ICD-10-CM | POA: Diagnosis not present

## 2014-12-16 ENCOUNTER — Ambulatory Visit
Admission: RE | Admit: 2014-12-16 | Discharge: 2014-12-16 | Disposition: A | Discharge status: Home / Self Care | Payer: Medicare Other | Source: Ambulatory Visit | Attending: Radiation Oncology | Admitting: Radiation Oncology

## 2014-12-16 ENCOUNTER — Encounter: Payer: Self-pay | Admitting: Radiation Oncology

## 2014-12-16 ENCOUNTER — Ambulatory Visit (HOSPITAL_COMMUNITY)
Admission: RE | Admit: 2014-12-16 | Discharge: 2014-12-16 | Disposition: A | Discharge status: Home / Self Care | Payer: Medicare Other | Source: Ambulatory Visit | Attending: Radiation Oncology | Admitting: Radiation Oncology

## 2014-12-16 VITALS — BP 148/66 | HR 64 | Temp 97.6°F | Resp 20 | Wt 155.8 lb

## 2014-12-16 DIAGNOSIS — C3431 Malignant neoplasm of lower lobe, right bronchus or lung: Secondary | ICD-10-CM | POA: Diagnosis not present

## 2014-12-16 DIAGNOSIS — T1490XA Injury, unspecified, initial encounter: Secondary | ICD-10-CM

## 2014-12-16 DIAGNOSIS — Z510 Encounter for antineoplastic radiation therapy: Secondary | ICD-10-CM | POA: Diagnosis not present

## 2014-12-16 DIAGNOSIS — S60211A Contusion of right wrist, initial encounter: Secondary | ICD-10-CM | POA: Diagnosis not present

## 2014-12-16 DIAGNOSIS — C3430 Malignant neoplasm of lower lobe, unspecified bronchus or lung: Secondary | ICD-10-CM | POA: Diagnosis not present

## 2014-12-16 DIAGNOSIS — C7972 Secondary malignant neoplasm of left adrenal gland: Secondary | ICD-10-CM | POA: Diagnosis not present

## 2014-12-16 DIAGNOSIS — Z792 Long term (current) use of antibiotics: Secondary | ICD-10-CM | POA: Diagnosis not present

## 2014-12-16 DIAGNOSIS — W1840XA Slipping, tripping and stumbling without falling, unspecified, initial encounter: Secondary | ICD-10-CM | POA: Diagnosis not present

## 2014-12-16 DIAGNOSIS — Z9221 Personal history of antineoplastic chemotherapy: Secondary | ICD-10-CM | POA: Diagnosis not present

## 2014-12-16 DIAGNOSIS — M25531 Pain in right wrist: Secondary | ICD-10-CM | POA: Diagnosis not present

## 2014-12-16 DIAGNOSIS — Z7901 Long term (current) use of anticoagulants: Secondary | ICD-10-CM | POA: Diagnosis not present

## 2014-12-16 DIAGNOSIS — R2231 Localized swelling, mass and lump, right upper limb: Secondary | ICD-10-CM | POA: Diagnosis not present

## 2014-12-16 NOTE — Progress Notes (Signed)
  Radiation Oncology         [phone] ________________________________  Name: Anthony Curry MRN: [id number]  Date: 12/16/2014  DOB: June 04, 1941  Weekly Radiation Therapy Management  Lung cancer   Staging form: Lung, AJCC 7th Edition     Clinical: Stage IV (T1a, N0, M1b) - Signed by [name], MD on 10/15/2013     Pathologic: No stage assigned - Unsigned   Current Dose: 30 Gy     Planned Dose:  30 Gy  Narrative . . . . . . . . The patient presents for routine under treatment assessment.                                   The patient is having less pain in his left flank area. He does however have discomfort/pain in the right forearm area. The patient fell at home on Friday. He did not present to the emergency room for evaluation.                                 Set-up films were reviewed.                                 The chart was checked. Physical Findings. . .  weight is 155 lb 12.8 oz (70.67 kg). His oral temperature is 97.6 F (36.4 C). His blood pressure is 148/66 and his pulse is 64. His respiration is 20. . The lungs are clear. The heart has regular rhythm and rate. Examination of the right forearm area reveals it to be significantly swollen particularly in the distal aspect towards the wrist area. Impression . . . . . . . The patient is tolerating radiation. Plan . . . . . . . . . . . . Routine follow-up in one month. The patient will proceed to radiology for imaging of his right forearm to rule out fracture.  ________________________________   Blair Promise, PhD, MD

## 2014-12-16 NOTE — Progress Notes (Signed)
Weekly rad txs 10/10 Left adrenal gland, pain 5/6 on 10 scale right arm, he fell last Friday and looks swollen, pain in back lower comes and goes, takes vicodin 5/325mg  prn mostly at night, no nausea, appetite comes and goes, energy level fatigued, slept 3 hours yesterday after treatment, request xray on right arm 11:20 AM

## 2014-12-17 ENCOUNTER — Telehealth: Payer: Self-pay | Admitting: *Deleted

## 2014-12-17 NOTE — Telephone Encounter (Signed)
CALLED PATIENT TO INFORM OF CONSULT WITH DR. Delilah Shan ON 12-19-14- ARRIVAL TIME - 8:45 AM , SPOKE WITH PATIENT AND HE IS AWARE OF THIS APPT.

## 2014-12-17 NOTE — Progress Notes (Signed)
   Department of Radiation Oncology  Phone:  [phone] Fax:        [phone]  Documentation  The patient's right forearm x-ray showed a lucency in the distal ulnar diaphysis without definite cortical disruption. A nondisplaced fracture could not be ruled out. Spoke with the patient today and he continues have a lot of pain in his forearm area and does wish to proceed with orthopedic evaluation. Patient will be scheduled for consultation with [facility] orthopedics.  -----------------------------------  Blair Promise, PhD, MD

## 2014-12-17 NOTE — Addendum Note (Signed)
Encounter addended by: Blair Promise, MD on: 12/17/2014  9:45 AM<BR>     Documentation filed: Notes Section

## 2014-12-19 DIAGNOSIS — S52234A Nondisplaced oblique fracture of shaft of right ulna, initial encounter for closed fracture: Secondary | ICD-10-CM | POA: Diagnosis not present

## 2014-12-30 ENCOUNTER — Encounter: Payer: Self-pay | Admitting: Radiation Oncology

## 2014-12-30 DIAGNOSIS — H2511 Age-related nuclear cataract, right eye: Secondary | ICD-10-CM | POA: Diagnosis not present

## 2014-12-30 NOTE — Progress Notes (Signed)
  Radiation Oncology         [phone]) [phone] ________________________________  Name: Anthony Curry MRN: [id number]  Date: 12/30/2014  DOB: 02/16/1941  End of Treatment Note     DIAGNOSIS: Stage IV (T1 A., N0, M1 B.) non-small cell lung cancerwith enlarging left adrenal mass and flank pain   Indication for treatment:  Pain and to avoid local progression       Radiation treatment dates:   December 29 through January 12  Site/dose:   Left adrenal gland, 30 gray in 10 fractions  Beams/energy:   3-D conformal, multiple beams, 10x, 6x photons  Narrative: The patient tolerated radiation treatment relatively well.   He denied any nausea during the course of his treatment. The patient's pain did improve somewhat during the course of his therapy.  Plan: The patient has completed radiation treatment. The patient will return to radiation oncology clinic for routine followup in one month. I advised them to call or return sooner if they have any questions or concerns related to their recovery or treatment.  -----------------------------------  Blair Promise, PhD, MD

## 2015-01-05 DIAGNOSIS — H2511 Age-related nuclear cataract, right eye: Secondary | ICD-10-CM | POA: Diagnosis not present

## 2015-01-05 DIAGNOSIS — H25811 Combined forms of age-related cataract, right eye: Secondary | ICD-10-CM | POA: Diagnosis not present

## 2015-01-08 DIAGNOSIS — S52234D Nondisplaced oblique fracture of shaft of right ulna, subsequent encounter for closed fracture with routine healing: Secondary | ICD-10-CM | POA: Diagnosis not present

## 2015-01-08 DIAGNOSIS — M79631 Pain in right forearm: Secondary | ICD-10-CM | POA: Diagnosis not present

## 2015-01-09 ENCOUNTER — Ambulatory Visit (INDEPENDENT_AMBULATORY_CARE_PROVIDER_SITE_OTHER): Payer: Medicare Other | Admitting: Nurse Practitioner

## 2015-01-09 ENCOUNTER — Encounter: Payer: Self-pay | Admitting: Nurse Practitioner

## 2015-01-09 VITALS — BP 128/70 | HR 68 | Ht 72.0 in | Wt 156.8 lb

## 2015-01-09 DIAGNOSIS — I480 Paroxysmal atrial fibrillation: Secondary | ICD-10-CM | POA: Diagnosis not present

## 2015-01-09 DIAGNOSIS — I481 Persistent atrial fibrillation: Secondary | ICD-10-CM | POA: Diagnosis not present

## 2015-01-09 DIAGNOSIS — I4819 Other persistent atrial fibrillation: Secondary | ICD-10-CM

## 2015-01-09 MED ORDER — DILTIAZEM HCL ER COATED BEADS 180 MG PO CP24: 180.0000 mg | ORAL_CAPSULE | Freq: Every day | ORAL | Status: DC

## 2015-01-09 NOTE — Patient Instructions (Signed)
Your physician recommends that you schedule a follow-up appointment in: 3 months with Dr [name]  Your physician has recommended you make the following change in your medication:  1) Decrease Cardizem to 180 mg daily

## 2015-01-09 NOTE — Progress Notes (Signed)
PCP:  [name], MD  The patient presents today for routine electrophysiology followup.  He was diagnosed with atrial fibrillation in May in the setting of treatment for his Stage IV lung CA. Had hospitalization 8/15 for AF with RVR. He had successful cardioversion 09/29/14. He continues with SR on diltiazem and metoprolol.   He has finished chemo/radiation for lung CA but is going thru evaluation for adrenal mass, which will decide treatment. He is unaware of any afib and ekg shows nsr. Tolerating NOAC.   He is otherwise doing reasonably well.  Today, he denies symptoms of palpitations, chest pain, some  lower extremity edema, dizziness, presyncope, syncope, or neurologic sequela.  The patient feels that he is tolerating medications without difficulties and is otherwise without complaint today.   Past Medical History  Diagnosis Date  . Hypertension   . GERD (gastroesophageal reflux disease)   . Alcohol abuse   . 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 Low sodium   . Cancer     small cell/lung  . Persistent atrial fibrillation 04/11/14    chads2vasc score is 2  . Hypotension   . Atrial fibrillation with RVR   . HCAP (healthcare-associated pneumonia)   . Adrenal hemorrhage   . BPH (benign prostatic hyperplasia)   . Hyponatremia   . Anemia of chronic disease   . Fall   . Alcohol intoxication   . Pulmonary nodules   . Adrenal mass   . Atrial flutter by electrocardiogram   . Neoplastic malignant related fatigue   . 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 Alcoholism in remission    Past Surgical History  Procedure Laterality Date  . Hemorrhoid surgery    . Ankle arthrodesis  1995    rt fx-hardware in  . Tonsillectomy    . Colonoscopy    . Excision / curettage bone cyst finger  2005    rt thumb  . Total shoulder arthroplasty  02/03/2012    Procedure: TOTAL SHOULDER ARTHROPLASTY;  Surgeon: [name], MD;  Location: [facility];  Service: Orthopedics;  Laterality: Left;  . Cardioversion N/A 09/29/2014    Procedure: CARDIOVERSION;  Surgeon: Josue Hector, MD;  Location: [facility] ENDOSCOPY;  Service: Cardiovascular;  Laterality: N/A;    Current Outpatient Prescriptions  Medication Sig Dispense Refill  . ALPRAZolam (XANAX) 0.25 MG tablet Take 0.25 mg by mouth 3 (three) times daily as needed for anxiety or sleep.    [name] apixaban (ELIQUIS) 5 MG TABS tablet Take 1 tablet (5 mg total) by mouth 2 (two) times daily. 180 tablet 0  . Ascorbic Acid (VITAMIN C PO) Take 1,000 mg by mouth daily.     . B Complex-C (SUPER B COMPLEX PO) Take 1 each by mouth daily.     . Biotin 7500 MCG TABS Take 1 tablet by mouth daily.     . calcium carbonate (OS-CAL) 600 MG TABS Take 600 mg by mouth daily with breakfast.    . demeclocycline (DECLOMYCIN) 150 MG tablet Take 150 mg by mouth 2 (two) times daily.    [name] dexamethasone (DECADRON) 4 MG tablet Take 4 mg by mouth See admin instructions. Take 1 tablet twice a day the day before, day of, and day after chemotherapy every three weeks.    [name] diltiazem (CARDIZEM CD) 180 MG 24 hr capsule Take 1 capsule (180 mg total) by mouth daily at 12 noon. 90 capsule 3  . furosemide (LASIX) 40 MG tablet  Take 1 tablet (40 mg total) by mouth 2 (two) times daily. 30 tablet 3  . HYDROcodone-acetaminophen (NORCO/VICODIN) 5-325 MG per tablet Take 1-2 tablets by mouth every 4 (four) hours as needed (pain). 30 tablet 0  . KLOR-CON M20 20 MEQ tablet     . Lactobacillus (ACIDOPHILUS) CAPS capsule Take 1 capsule by mouth daily.    [name] MAGNESIUM PO Take 400 mg by mouth daily.     . metoprolol tartrate (LOPRESSOR) 25 MG tablet Take 1 tablet (25 mg total) by mouth 2 (two) times daily. 180 tablet 1  . mirtazapine (REMERON) 15 MG tablet Take 1 tablet (15 mg total) by mouth at bedtime. 30 tablet 0  . Multiple Vitamin (MULTIVITAMIN WITH MINERALS) TABS Take 1 tablet by mouth daily.    . Nutritional Supplements (ENSURE COMPLETE PO)  Take 1 Can by mouth 2 (two) times daily.    . Omega-3 Fatty Acids (FISH OIL PO) Take 1 tablet by mouth daily.    [name] omeprazole (PRILOSEC) 20 MG capsule Take 20 mg by mouth daily as needed (heartburn).     . Potassium Chloride ER 20 MEQ TBCR Take 20 mEq by mouth as directed. 1st day take 4 tabs total = 80 meq; starting on day 2 take 1 tab twice daily 65 tablet 6  . PRESCRIPTION MEDICATION Chemo at [facility]    . tamsulosin (FLOMAX) 0.4 MG CAPS capsule Take 0.4 mg by mouth daily.     [name] thiamine (VITAMIN B-1) 100 MG tablet Take 1 tablet (100 mg total) by mouth daily. 30 tablet 6  . vitamin E 400 UNIT capsule Take 400 Units by mouth daily.     No current facility-administered medications for this visit.    No Known Allergies  History   Social History  . Marital Status: Married    Spouse Name: N/A    Number of Children: 3  . Years of Education: N/A   Occupational History  . 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 Smoking status: Former Smoker -- 2.00 packs/day for 30 years    Types: Cigarettes    Quit date: 12/05/1990  . Smokeless tobacco: Never Used  . Alcohol Use: No     Comment: does not drink now  . Drug Use: No  . Sexual Activity: Not Currently   Other Topics Concern  . Not on file   Social History Narrative   Retired from [employer] History  Problem Relation Age of Onset  . Brain cancer Brother   . Lung cancer Mother   . Hypertension Father   . Diabetes Paternal Grandmother   . Heart attack Father   . Heart attack Brother     ROS-  All systems are reviewed and are negative except as outlined in the HPI above  Physical Exam: Filed Vitals:   01/09/15 1507  BP: 128/70  Pulse: 68  Height: 6' (1.829 m)  Weight: 156 lb 12.8 oz (71.124 kg)    GEN- The patient is ill appearing, alert and oriented x 3 today.   Head- normocephalic, atraumatic Eyes-  Sclera clear, conjunctiva pink Ears- hearing intact Oropharynx-  clear Neck- supple,   Lungs- decreased BS at the bases, normal work of breathing Heart- tachycardic irregular rhythm GI- soft, NT, ND, + BS Extremities- no clubbing, cyanosis, 1t edemal rt, trace left MS- no significant deformity or atrophy Skin- no rash or lesion Psych- euthymic mood, full affect Neuro- strength  and sensation are intact  ekg today reveals SRwith first degree av block, PR 249ms,QRS 82 ms, QTc 417ms.  Epic records reviewed.   Assessment and Plan:  1. PAF s/p DCCV 10/15 Is in SR, not aware of any RVR. Per Dr. Jackalyn Lombard note will try to decrease CBB to minimize LEE. Decrease cardizem to 180 mg qd. Coninue NOAC   2. Stage IV lung CA Stable  3. Adrenal Mass Being evaluated for required treatment.  3. Hypertensive cardiovascular disease Stable No change required today    Return in 6 months with Dr. [name].

## 2015-01-12 ENCOUNTER — Other Ambulatory Visit (HOSPITAL_BASED_OUTPATIENT_CLINIC_OR_DEPARTMENT_OTHER): Payer: Medicare Other

## 2015-01-12 ENCOUNTER — Ambulatory Visit (HOSPITAL_COMMUNITY)
Admission: RE | Admit: 2015-01-12 | Discharge: 2015-01-12 | Disposition: A | Discharge status: Home / Self Care | Payer: Medicare Other | Source: Ambulatory Visit | Attending: Internal Medicine | Admitting: Internal Medicine

## 2015-01-12 ENCOUNTER — Encounter (HOSPITAL_COMMUNITY): Payer: Self-pay

## 2015-01-12 DIAGNOSIS — C3492 Malignant neoplasm of unspecified part of left bronchus or lung: Secondary | ICD-10-CM | POA: Diagnosis not present

## 2015-01-12 DIAGNOSIS — J90 Pleural effusion, not elsewhere classified: Secondary | ICD-10-CM | POA: Diagnosis not present

## 2015-01-12 DIAGNOSIS — C7970 Secondary malignant neoplasm of unspecified adrenal gland: Secondary | ICD-10-CM | POA: Diagnosis not present

## 2015-01-12 DIAGNOSIS — C3431 Malignant neoplasm of lower lobe, right bronchus or lung: Secondary | ICD-10-CM | POA: Diagnosis not present

## 2015-01-12 DIAGNOSIS — Z85118 Personal history of other malignant neoplasm of bronchus and lung: Secondary | ICD-10-CM

## 2015-01-12 LAB — CBC WITH DIFFERENTIAL/PLATELET
BASO%: 0.6 % (ref 0.0–2.0)
Basophils Absolute: 0 10*3/uL (ref 0.0–0.1)
EOS ABS: 0.3 10*3/uL (ref 0.0–0.5)
EOS%: 6.4 % (ref 0.0–7.0)
HCT: 41.8 % (ref 38.4–49.9)
HGB: 14 g/dL (ref 13.0–17.1)
LYMPH#: 0.9 10*3/uL (ref 0.9–3.3)
LYMPH%: 21.3 % (ref 14.0–49.0)
MCH: 31.2 pg (ref 27.2–33.4)
MCHC: 33.6 g/dL (ref 32.0–36.0)
MCV: 92.9 fL (ref 79.3–98.0)
MONO#: 0.5 10*3/uL (ref 0.1–0.9)
MONO%: 13.2 % (ref 0.0–14.0)
NEUT#: 2.4 10*3/uL (ref 1.5–6.5)
NEUT%: 58.5 % (ref 39.0–75.0)
PLATELETS: 235 10*3/uL (ref 140–400)
RBC: 4.5 10*6/uL (ref 4.20–5.82)
RDW: 14.5 % (ref 11.0–14.6)
WBC: 4.2 10*3/uL (ref 4.0–10.3)

## 2015-01-12 LAB — COMPREHENSIVE METABOLIC PANEL (CC13)
ALBUMIN: 3.7 g/dL (ref 3.5–5.0)
ALK PHOS: 120 U/L (ref 40–150)
ALT: 13 U/L (ref 0–55)
AST: 21 U/L (ref 5–34)
Anion Gap: 11 mEq/L (ref 3–11)
BILIRUBIN TOTAL: 0.55 mg/dL (ref 0.20–1.20)
BUN: 11 mg/dL (ref 7.0–26.0)
CO2: 28 mEq/L (ref 22–29)
CREATININE: 0.7 mg/dL (ref 0.7–1.3)
Calcium: 9.7 mg/dL (ref 8.4–10.4)
Chloride: 97 mEq/L — ABNORMAL LOW (ref 98–109)
GLUCOSE: 105 mg/dL (ref 70–140)
POTASSIUM: 3.9 meq/L (ref 3.5–5.1)
Sodium: 136 mEq/L (ref 136–145)
Total Protein: 6.3 g/dL — ABNORMAL LOW (ref 6.4–8.3)

## 2015-01-12 MED ORDER — IOHEXOL 300 MG/ML  SOLN
100.0000 mL | Freq: Once | INTRAMUSCULAR | Status: AC | PRN
  Administered 2015-01-12: 100 mL via INTRAVENOUS

## 2015-01-19 ENCOUNTER — Ambulatory Visit (HOSPITAL_BASED_OUTPATIENT_CLINIC_OR_DEPARTMENT_OTHER): Payer: Medicare Other | Admitting: Internal Medicine

## 2015-01-19 ENCOUNTER — Telehealth: Payer: Self-pay

## 2015-01-19 ENCOUNTER — Other Ambulatory Visit: Payer: Self-pay | Admitting: Medical Oncology

## 2015-01-19 ENCOUNTER — Encounter: Payer: Self-pay | Admitting: Internal Medicine

## 2015-01-19 VITALS — BP 151/68 | HR 72 | Temp 97.4°F | Resp 18 | Ht 72.0 in | Wt 158.0 lb

## 2015-01-19 DIAGNOSIS — I4891 Unspecified atrial fibrillation: Secondary | ICD-10-CM | POA: Diagnosis not present

## 2015-01-19 DIAGNOSIS — C3492 Malignant neoplasm of unspecified part of left bronchus or lung: Secondary | ICD-10-CM

## 2015-01-19 DIAGNOSIS — R5382 Chronic fatigue, unspecified: Secondary | ICD-10-CM

## 2015-01-19 DIAGNOSIS — C3410 Malignant neoplasm of upper lobe, unspecified bronchus or lung: Secondary | ICD-10-CM

## 2015-01-19 DIAGNOSIS — C7970 Secondary malignant neoplasm of unspecified adrenal gland: Secondary | ICD-10-CM | POA: Diagnosis not present

## 2015-01-19 DIAGNOSIS — C3431 Malignant neoplasm of lower lobe, right bronchus or lung: Secondary | ICD-10-CM

## 2015-01-19 DIAGNOSIS — M7989 Other specified soft tissue disorders: Secondary | ICD-10-CM

## 2015-01-19 DIAGNOSIS — I10 Essential (primary) hypertension: Secondary | ICD-10-CM

## 2015-01-19 DIAGNOSIS — C3490 Malignant neoplasm of unspecified part of unspecified bronchus or lung: Secondary | ICD-10-CM

## 2015-01-19 MED ORDER — MIRTAZAPINE 15 MG PO TABS: 15.0000 mg | ORAL_TABLET | Freq: Every day | ORAL | Status: DC

## 2015-01-19 MED ORDER — HYDROCODONE-ACETAMINOPHEN 5-325 MG PO TABS: 1.0000 | ORAL_TABLET | ORAL | Status: DC | PRN

## 2015-01-19 NOTE — Telephone Encounter (Signed)
1202 pt called for some information and prescription. Called back at 1230. Pt wanted norco refill. The rx has been generated by diane bell. He was also wanting information about the next medication he is to receive that supports the immune system. He will ask about that when he comes to pick up his rx.

## 2015-01-19 NOTE — Progress Notes (Signed)
[place] [phone]   Fax:([phone]) [place], [place] [address]  DIAGNOSIS: Stage IV (T1 A., N0, M1 B.) non-small cell lung cancer, adenocarcinoma with negative EGFR mutation and negative ALK gene translocation diagnosed in October of 2014.   Molecular profile: Negative for RET, ALK, BRAF, MET, EGFR.  Positive for ERBB2 A775T, IOE7O350K, KRAS G13E, MAP2K1 D67N (see full report).   PRIOR THERAPY:  1) Systemic chemotherapy with carboplatin for AUC of 5 and Alimta 500 mg/M2 every 3 weeks, status post 6 cycles with stable disease. First dose on 10/23/2013. 2) Maintenance systemic chemotherapy with single agent Alimta 500 mg/M2 every 3 weeks. Status post 8 cycles.   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 Sondra Come completed on 12/16/2014.  CURRENT THERAPY: Immunotherapy with Nivolumab 3 MG/KG every 2 weeks. First cycle 01/28/2015  CHEMOTHERAPY INTENT: Palliative  CURRENT # OF CHEMOTHERAPY CYCLES: 1 CURRENT ANTIEMETICS: Zofran, Compazine and dexamethasone  CURRENT SMOKING STATUS: Former smoker  ORAL CHEMOTHERAPY AND CONSENT: None  CURRENT BISPHOSPHONATES USE: None  PAIN MANAGEMENT: 0/10  NARCOTICS INDUCED CONSTIPATION: None  LIVING WILL AND CODE STATUS: ?   INTERVAL HISTORY: Anthony Curry 74 y.o. male returns to the clinic today for followup visit accompanied by his wife and daughter. He tolerated the previous course of palliative radiotherapy to the left adrenal gland fairly well. He is feeling much better today. He denied having any significant chest pain, shortness of breath, cough or hemoptysis. Anthony Curry He denied having any significant fever or chills, no nausea or vomiting. The patient had repeat CT scan of the chest, abdomen and pelvis performed recently and he is here for evaluation and discussion of his scan results.  MEDICAL  HISTORY: Past Medical History  Diagnosis Date  . Hypertension   . GERD (gastroesophageal reflux disease)   . Alcohol abuse   . 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 Low sodium   . Cancer     small cell/lung  . Persistent atrial fibrillation 04/11/14    chads2vasc score is 2  . Hypotension   . Atrial fibrillation with RVR   . HCAP (healthcare-associated pneumonia)   . Adrenal hemorrhage   . BPH (benign prostatic hyperplasia)   . Hyponatremia   . Anemia of chronic disease   . Fall   . Alcohol intoxication   . Pulmonary nodules   . Adrenal mass   . Atrial flutter by electrocardiogram   . Neoplastic malignant related fatigue   . 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 Alcoholism in remission     ALLERGIES:  has No Known Allergies.  MEDICATIONS:  Current Outpatient Prescriptions  Medication Sig Dispense Refill  . ALPRAZolam (XANAX) 0.25 MG tablet Take 0.25 mg by mouth 3 (three) times daily as needed for anxiety or sleep.    Anthony Curry apixaban (ELIQUIS) 5 MG TABS tablet Take 1 tablet (5 mg total) by mouth 2 (two) times daily. 180 tablet 0  . Ascorbic Acid (VITAMIN C PO) Take 1,000 mg by mouth daily.     . B Complex-C (SUPER B COMPLEX PO) Take 1 each by mouth daily.     . Biotin 7500 MCG TABS Take 1 tablet by mouth daily.     . calcium carbonate (OS-CAL) 600 MG TABS Take 600 mg by mouth daily with breakfast.    . demeclocycline (DECLOMYCIN) 150 MG tablet Take 150  mg by mouth 2 (two) times daily.    Anthony Curry dexamethasone (DECADRON) 4 MG tablet Take 4 mg by mouth See admin instructions. Take 1 tablet twice a day the day before, day of, and day after chemotherapy every three weeks.    Anthony Curry diltiazem (CARDIZEM CD) 180 MG 24 hr capsule Take 1 capsule (180 mg total) by mouth daily at 12 noon. 90 capsule 3  . furosemide (LASIX) 40 MG tablet Take 1 tablet (40 mg total) by mouth 2 (two) times daily. 30 tablet 3  . HYDROcodone-acetaminophen (NORCO/VICODIN) 5-325 MG per tablet Take 1-2  tablets by mouth every 4 (four) hours as needed (pain). 30 tablet 0  . KLOR-CON M20 20 MEQ tablet     . Lactobacillus (ACIDOPHILUS) CAPS capsule Take 1 capsule by mouth daily.    Anthony Curry MAGNESIUM PO Take 400 mg by mouth daily.     . metoprolol tartrate (LOPRESSOR) 25 MG tablet Take 1 tablet (25 mg total) by mouth 2 (two) times daily. 180 tablet 1  . mirtazapine (REMERON) 15 MG tablet Take 1 tablet (15 mg total) by mouth at bedtime. 30 tablet 0  . Multiple Vitamin (MULTIVITAMIN WITH MINERALS) TABS Take 1 tablet by mouth daily.    . Nutritional Supplements (ENSURE COMPLETE PO) Take 1 Can by mouth 2 (two) times daily.    . Omega-3 Fatty Acids (FISH OIL PO) Take 1 tablet by mouth daily.    Anthony Curry omeprazole (PRILOSEC) 20 MG capsule Take 20 mg by mouth daily as needed (heartburn).     . Potassium Chloride ER 20 MEQ TBCR Take 20 mEq by mouth as directed. 1st day take 4 tabs total = 80 meq; starting on day 2 take 1 tab twice daily 65 tablet 6  . PRESCRIPTION MEDICATION Chemo at [facility]    . tamsulosin (FLOMAX) 0.4 MG CAPS capsule Take 0.4 mg by mouth daily.     Anthony Curry thiamine (VITAMIN B-1) 100 MG tablet Take 1 tablet (100 mg total) by mouth daily. 30 tablet 6  . vitamin E 400 UNIT capsule Take 400 Units by mouth daily.     No current facility-administered medications for this visit.    SURGICAL HISTORY:  Past Surgical History  Procedure Laterality Date  . Hemorrhoid surgery    . Ankle arthrodesis  1995    rt fx-hardware in  . Tonsillectomy    . Colonoscopy    . Excision / curettage bone cyst finger  2005    rt thumb  . Total shoulder arthroplasty  02/03/2012    Procedure: TOTAL SHOULDER ARTHROPLASTY;  Surgeon: Eulas Post, MD;  Location: [facility];  Service: Orthopedics;  Laterality: Left;  . Cardioversion N/A 09/29/2014    Procedure: CARDIOVERSION;  Surgeon: Wendall Stade, MD;  Location: [facility] ENDOSCOPY;  Service: Cardiovascular;  Laterality: N/A;    REVIEW OF SYSTEMS:  Constitutional:  positive for fatigue Eyes: negative Ears, nose, mouth, throat, and face: negative Respiratory: negative Cardiovascular: negative Gastrointestinal: negative Genitourinary:negative Integument/breast: negative Hematologic/lymphatic: negative Musculoskeletal:negative Neurological: negative Behavioral/Psych: negative Endocrine: negative Allergic/Immunologic: negative   PHYSICAL EXAMINATION: General appearance: alert, cooperative, fatigued and no distress Head: Normocephalic, without obvious abnormality, atraumatic Neck: no adenopathy, no JVD, supple, symmetrical, trachea midline and thyroid not enlarged, symmetric, no tenderness/mass/nodules Lymph nodes: Cervical, supraclavicular, and axillary nodes normal. Resp: clear to auscultation bilaterally Back: symmetric, no curvature. ROM normal. No CVA tenderness. Cardio: regular rate and rhythm, S1, S2 normal, no murmur, click, rub or gallop GI: soft, non-tender; bowel sounds  normal; no masses,  no organomegaly Extremities: extremities normal, atraumatic, no cyanosis or edema Neurologic: Alert and oriented X 3, normal strength and tone. Normal symmetric reflexes. Normal coordination and gait  ECOG PERFORMANCE STATUS: 1 - Symptomatic but completely ambulatory  There were no vitals taken for this visit.  LABORATORY DATA: Lab Results  Component Value Date   WBC 4.2 01/12/2015   HGB 14.0 01/12/2015   HCT 41.8 01/12/2015   MCV 92.9 01/12/2015   PLT 235 01/12/2015      Chemistry      Component Value Date/Time   NA 136 01/12/2015 1015   NA 132* 09/29/2014 1115   K 3.9 01/12/2015 1015   K 3.3* 09/29/2014 1115   CL 91* 09/22/2014 1122   CO2 28 01/12/2015 1015   CO2 30 09/22/2014 1122   BUN 11.0 01/12/2015 1015   BUN 21 09/22/2014 1122   CREATININE 0.7 01/12/2015 1015   CREATININE 0.8 09/22/2014 1122      Component Value Date/Time   CALCIUM 9.7 01/12/2015 1015   CALCIUM 8.9 09/22/2014 1122   ALKPHOS 120 01/12/2015 1015   ALKPHOS  100 09/02/2014 1400   AST 21 01/12/2015 1015   AST 25 09/02/2014 1400   ALT 13 01/12/2015 1015   ALT 15 09/02/2014 1400   BILITOT 0.55 01/12/2015 1015   BILITOT 0.8 09/02/2014 1400       RADIOGRAPHIC STUDIES: Ct Chest W Contrast  01/12/2015   CLINICAL DATA:  Subsequent encounter for left-sided lung cancer apparent  EXAM: CT CHEST, ABDOMEN, AND PELVIS WITH CONTRAST  TECHNIQUE: Multidetector CT imaging of the chest, abdomen and pelvis was performed following the standard protocol during bolus administration of intravenous contrast.  CONTRAST:  189mL OMNIPAQUE IOHEXOL 300 MG/ML  SOLN  COMPARISON:  11/10/2014  FINDINGS: CT CHEST FINDINGS  Mediastinum/Nodes: No evidence for axillary or mediastinal lymphadenopathy. No hilar lymphadenopathy. Heart size is normal. Coronary artery calcification is noted. Tiny anterior pericardial effusion is evident.  Lungs/Pleura: Paraseptal and centrilobular emphysema again noted. Spiculated left apical nodule measures 14 x 18 mm today compared to 11 x 17 mm previously. Scattered areas of ground-glass opacity are unchanged. 8 mm nodule in the posteromedial right lower lobe (image 44 series 4) is new in the interval. There is an adjacent new 6 mm nodule, as well. Densely calcified granuloma is identified in the posterior left costophrenic sulcus. 2 mm right lower lobe nodule on image 58 is unchanged.  Musculoskeletal: Patient is status post left shoulder replacement with advanced degenerative changes noted in the right shoulder.  CT ABDOMEN AND PELVIS FINDINGS  Hepatobiliary: The tiny hypervascular lesions in the dome of the liver are stable (exophytic left liver lesion seen on image 61 series 2 and anterior right hepatic lesion seen on image 62 series 2). Scattered small hypo attenuating foci in the liver are also unchanged and likely represent cysts. Tiny layering gallstones are seen in the lumen of the gallbladder. No intrahepatic or extrahepatic biliary dilation.  Pancreas:  Pancreas is atrophic without ductal dilatation. No focal pancreatic mass lesion.  Spleen: 5 mm enhancing focus in the dome of the spleen is unchanged.  Adrenals/Urinary Tract: 2.5 x 3.7 cm left adrenal mass seen previously has decreased in size, now measuring 2.1 x 1.3 cm No nodule or mass in the right adrenal. Areas of cortical scarring are seen in both kidneys without enhancing renal mass lesion. No evidence for hydroureter. The bladder is distended with bladder wall trabeculation and scattered small bladder diverticuli.  Stomach/Bowel: Stomach is nondistended. No gastric wall thickening. No evidence of outlet obstruction. Small distal duodenum diverticulum noted. No small bowel wall thickening. No small bowel dilatation. Terminal ileum is normal. The appendix is not visualized, but there is no edema or inflammation in the region of the cecum. No gross colonic mass. No colonic wall thickening. No substantial diverticular change.  Vascular/Lymphatic: Atherosclerotic calcification is noted in the wall of the abdominal aorta without aneurysm. No gastrohepatic or hepatoduodenal ligament lymphadenopathy. No retroperitoneal lymphadenopathy. No evidence for pelvic sidewall lymphadenopathy.  Reproductive: Prostate gland is not enlarged. Seminal vesicles are unremarkable.  Other: No evidence for intraperitoneal free fluid.  Musculoskeletal: Tiny sclerotic foci in the left iliac bone are unchanged and likely benign. Degenerative changes are noted in the lumbar spine. Nonacute right-sided rib fractures are evident.  IMPRESSION: 1. 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 2. The spiculated nodule in the medial left lung apex has increased in size in the interval. Adjacent small nodules are seen in the right lower lobe of indeterminate etiology. Attention on followup imaging is recommended. 3. Stable appearance of small hypervascular lesions in the liver and spleen. 4. Interval  decrease in small right pleural effusion. 5. Cholelithiasis. 6. Atherosclerosis.   Electronically Signed   By: Kennith Center M.D.   On: 01/12/2015 12:08   Ct Abdomen Pelvis W Contrast  01/12/2015   CLINICAL DATA:  Subsequent encounter for left-sided lung cancer apparent  EXAM: CT CHEST, ABDOMEN, AND PELVIS WITH CONTRAST  TECHNIQUE: Multidetector CT imaging of the chest, abdomen and pelvis was performed following the standard protocol during bolus administration of intravenous contrast.  CONTRAST:  OMNIPAQUE IOHEXOL 300 MG/ML  SOLN  COMPARISON:  11/10/2014  FINDINGS: CT CHEST FINDINGS  Mediastinum/Nodes: No evidence for axillary or mediastinal lymphadenopathy. No hilar lymphadenopathy. Heart size is normal. Coronary artery calcification is noted. Tiny anterior pericardial effusion is evident.  Lungs/Pleura: Paraseptal and centrilobular emphysema again noted. Spiculated left apical nodule measures 14 x 18 mm today compared to 11 x 17 mm previously. Scattered areas of ground-glass opacity are unchanged. 8 mm nodule in the posteromedial right lower lobe (image 44 series 4) is new in the interval. There is an adjacent new 6 mm nodule, as well. Densely calcified granuloma is identified in the posterior left costophrenic sulcus. 2 mm right lower lobe nodule on image 58 is unchanged.  Musculoskeletal: Patient is status post left shoulder replacement with advanced degenerative changes noted in the right shoulder.  CT ABDOMEN AND PELVIS FINDINGS  Hepatobiliary: The tiny hypervascular lesions in the dome of the liver are stable (exophytic left liver lesion seen on image 61 series 2 and anterior right hepatic lesion seen on image 62 series 2). Scattered small hypo attenuating foci in the liver are also unchanged and likely represent cysts. Tiny layering gallstones are seen in the lumen of the gallbladder. No intrahepatic or extrahepatic biliary dilation.  Pancreas: Pancreas is atrophic without ductal dilatation. No  focal pancreatic mass lesion.  Spleen: 5 mm enhancing focus in the dome of the spleen is unchanged.  Adrenals/Urinary Tract: 2.5 x 3.7 cm left adrenal mass seen previously has decreased in size, now measuring 2.1 x 1.3 cm No nodule or mass in the right adrenal. Areas of cortical scarring are seen in both kidneys without enhancing renal mass lesion. No evidence for hydroureter. The bladder is distended with bladder wall trabeculation and scattered small bladder diverticuli.  Stomach/Bowel: Stomach is nondistended. No gastric wall thickening.  No evidence of outlet obstruction. Small distal duodenum diverticulum noted. No small bowel wall thickening. No small bowel dilatation. Terminal ileum is normal. The appendix is not visualized, but there is no edema or inflammation in the region of the cecum. No gross colonic mass. No colonic wall thickening. No substantial diverticular change.  Vascular/Lymphatic: Atherosclerotic calcification is noted in the wall of the abdominal aorta without aneurysm. No gastrohepatic or hepatoduodenal ligament lymphadenopathy. No retroperitoneal lymphadenopathy. No evidence for pelvic sidewall lymphadenopathy.  Reproductive: Prostate gland is not enlarged. Seminal vesicles are unremarkable.  Other: No evidence for intraperitoneal free fluid.  Musculoskeletal: Tiny sclerotic foci in the left iliac bone are unchanged and likely benign. Degenerative changes are noted in the lumbar spine. Nonacute right-sided rib fractures are evident.  IMPRESSION: 1. 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 2. The spiculated nodule in the medial left lung apex has increased in size in the interval. Adjacent small nodules are seen in the right lower lobe of indeterminate etiology. Attention on followup imaging is recommended. 3. Stable appearance of small hypervascular lesions in the liver and spleen. 4. Interval decrease in small right pleural effusion. 5.  Cholelithiasis. 6. Atherosclerosis.   Electronically Signed   By: Anthony Curry M.D.   On: 01/12/2015 12:08   ASSESSMENT AND PLAN: This is a very pleasant 74 years old white male with:  1)  stage IV non-small cell lung cancer, adenocarcinoma status post induction chemotherapy with carboplatin and Alimta and currently on maintenance treatment with single agent Alimta status post 8 cycles. He has been observation for the last few months.  He had evidence for disease progression on the left adrenal gland and the patient underwent palliative radiotherapy to the left adrenal gland under the care of Dr. Sondra Come completed on 12/16/2014. The recent CT scan of the chest, abdomen and pelvis showed improvement in the left adrenal gland metastasis. There was also mild increase in the size of the spiculated left upper lobe nodule as well as questionable new small nodules in the right lung. I discussed the scan results and showed the images to the patient and his family. I gave him several options for management of his condition including continuous observation and close monitoring versus stereotactic radiotherapy to the enlarging left upper lobe lung nodule versus systemic immunotherapy with Nivolumab 3 MG/KG every 2 weeks. After discussion of all the options the patient and his family would like to proceed with the immunotherapy. I discussed with the patient adverse effect of this treatment including but not limited to immune mediated skin rash, diarrhea, liver or renal disease as well as endocrine dysfunction. He is expected to start the first cycle of this treatment next week. The patient would come back for follow-up visit in 3 weeks with the start of cycle #2.  2) atrial fibrillation: He'll continue his current medication with Cardizem, digoxin and metoprolol in addition to Eliquis. He'll also continue his routine followup visit with his cardiologist Dr. [name].  He was advised to call immediately if he has any  concerning symptoms in the interval. The patient voices understanding of current disease status and treatment options and is in agreement with the current care plan.  All questions were answered. The patient knows to call the clinic with any problems, questions or concerns. We can certainly see the patient much sooner if necessary.  Disclaimer: This note was dictated with voice recognition software. Similar sounding words can inadvertently be transcribed and may not be corrected  upon review.

## 2015-01-19 NOTE — Progress Notes (Signed)
Pt notified to pick up rx today .

## 2015-01-20 ENCOUNTER — Telehealth: Payer: Self-pay | Admitting: Internal Medicine

## 2015-01-20 NOTE — Telephone Encounter (Signed)
spoke with pt and advised on Feb and March appt.Marland KitchenMarland KitchenMarland KitchenMarland Kitchenpt ok and aware

## 2015-01-28 ENCOUNTER — Other Ambulatory Visit: Payer: BLUE CROSS/BLUE SHIELD

## 2015-01-28 ENCOUNTER — Other Ambulatory Visit (HOSPITAL_BASED_OUTPATIENT_CLINIC_OR_DEPARTMENT_OTHER): Payer: Medicare Other

## 2015-01-28 ENCOUNTER — Ambulatory Visit (HOSPITAL_BASED_OUTPATIENT_CLINIC_OR_DEPARTMENT_OTHER): Payer: Medicare Other

## 2015-01-28 DIAGNOSIS — C3410 Malignant neoplasm of upper lobe, unspecified bronchus or lung: Secondary | ICD-10-CM

## 2015-01-28 DIAGNOSIS — C7972 Secondary malignant neoplasm of left adrenal gland: Secondary | ICD-10-CM | POA: Diagnosis not present

## 2015-01-28 DIAGNOSIS — C3431 Malignant neoplasm of lower lobe, right bronchus or lung: Secondary | ICD-10-CM

## 2015-01-28 DIAGNOSIS — Z79899 Other long term (current) drug therapy: Secondary | ICD-10-CM

## 2015-01-28 DIAGNOSIS — Z5112 Encounter for antineoplastic immunotherapy: Secondary | ICD-10-CM | POA: Diagnosis not present

## 2015-01-28 DIAGNOSIS — R5382 Chronic fatigue, unspecified: Secondary | ICD-10-CM

## 2015-01-28 DIAGNOSIS — C3492 Malignant neoplasm of unspecified part of left bronchus or lung: Secondary | ICD-10-CM

## 2015-01-28 LAB — CBC WITH DIFFERENTIAL/PLATELET
BASO%: 0.2 % (ref 0.0–2.0)
Basophils Absolute: 0 10*3/uL (ref 0.0–0.1)
EOS%: 5.7 % (ref 0.0–7.0)
Eosinophils Absolute: 0.3 10*3/uL (ref 0.0–0.5)
HCT: 39.7 % (ref 38.4–49.9)
HGB: 14.1 g/dL (ref 13.0–17.1)
LYMPH%: 14.4 % (ref 14.0–49.0)
MCH: 32 pg (ref 27.2–33.4)
MCHC: 35.5 g/dL (ref 32.0–36.0)
MCV: 90 fL (ref 79.3–98.0)
MONO#: 0.6 10*3/uL (ref 0.1–0.9)
MONO%: 10 % (ref 0.0–14.0)
NEUT#: 3.8 10*3/uL (ref 1.5–6.5)
NEUT%: 69.7 % (ref 39.0–75.0)
PLATELETS: 218 10*3/uL (ref 140–400)
RBC: 4.41 10*6/uL (ref 4.20–5.82)
RDW: 14.7 % — ABNORMAL HIGH (ref 11.0–14.6)
WBC: 5.5 10*3/uL (ref 4.0–10.3)
lymph#: 0.8 10*3/uL — ABNORMAL LOW (ref 0.9–3.3)

## 2015-01-28 LAB — COMPREHENSIVE METABOLIC PANEL (CC13)
ALBUMIN: 3.6 g/dL (ref 3.5–5.0)
ALT: 13 U/L (ref 0–55)
AST: 23 U/L (ref 5–34)
Alkaline Phosphatase: 129 U/L (ref 40–150)
Anion Gap: 11 mEq/L (ref 3–11)
BILIRUBIN TOTAL: 0.89 mg/dL (ref 0.20–1.20)
BUN: 15.7 mg/dL (ref 7.0–26.0)
CHLORIDE: 98 meq/L (ref 98–109)
CO2: 25 mEq/L (ref 22–29)
Calcium: 9.5 mg/dL (ref 8.4–10.4)
Creatinine: 0.7 mg/dL (ref 0.7–1.3)
Glucose: 103 mg/dl (ref 70–140)
Potassium: 3.9 mEq/L (ref 3.5–5.1)
Sodium: 135 mEq/L — ABNORMAL LOW (ref 136–145)
TOTAL PROTEIN: 6.3 g/dL — AB (ref 6.4–8.3)

## 2015-01-28 LAB — TSH CHCC: TSH: 0.909 m(IU)/L (ref 0.320–4.118)

## 2015-01-28 MED ORDER — NIVOLUMAB CHEMO INJECTION 100 MG/10ML
3.0000 mg/kg | Freq: Once | INTRAVENOUS | Status: AC
  Administered 2015-01-28: 220 mg via INTRAVENOUS
  Filled 2015-01-28: qty 22

## 2015-01-28 MED ORDER — SODIUM CHLORIDE 0.9 % IV SOLN
Freq: Once | INTRAVENOUS | Status: AC
  Administered 2015-01-28: 14:00:00 via INTRAVENOUS

## 2015-01-28 NOTE — Patient Instructions (Signed)
[facility] Discharge Instructions for Patients Receiving Chemotherapy  Today you received the following chemotherapy agent: Nivolumab/Opdivo  To help prevent nausea and vomiting after your treatment, we encourage you to take your nausea medication as prescribed.    If you develop nausea and vomiting that is not controlled by your nausea medication, call the clinic.   BELOW ARE SYMPTOMS THAT SHOULD BE REPORTED IMMEDIATELY:  *FEVER GREATER THAN 100.5 F  *CHILLS WITH OR WITHOUT FEVER  NAUSEA AND VOMITING THAT IS NOT CONTROLLED WITH YOUR NAUSEA MEDICATION  *UNUSUAL SHORTNESS OF BREATH  *UNUSUAL BRUISING OR BLEEDING  TENDERNESS IN MOUTH AND THROAT WITH OR WITHOUT PRESENCE OF ULCERS  *URINARY PROBLEMS  *BOWEL PROBLEMS  UNUSUAL RASH Items with * indicate a potential emergency and should be followed up as soon as possible.  Feel free to call the clinic you have any questions or concerns. The clinic phone number is [phone].   Nivolumab injection What is this medicine? NIVOLUMAB (nye VOL ue mab) is used to treat certain types of melanoma and lung cancer. This medicine may be used for other purposes; ask your health care provider or pharmacist if you have questions. COMMON BRAND NAME(S): Opdivo What should I tell my health care provider before I take this medicine? They need to know if you have any of these conditions: -eye disease, vision problems -history of pancreatitis -immune system problems -inflammatory bowel disease -kidney disease -liver disease -lung disease -lupus -myasthenia gravis -multiple sclerosis -organ transplant -stomach or intestine problems -thyroid disease -tingling of the fingers or toes, or other nerve disorder -an unusual or allergic reaction to nivolumab, other medicines, foods, dyes, or preservatives -pregnant or trying to get pregnant -breast-feeding How should I use this medicine? This medicine is for infusion into a  vein. It is given by a health care professional in a hospital or clinic setting. A special MedGuide will be given to you before each treatment. Be sure to read this information carefully each time. Talk to your pediatrician regarding the use of this medicine in children. Special care may be needed. Overdosage: If you think you've taken too much of this medicine contact a poison control center or emergency room at once. Overdosage: If you think you have taken too much of this medicine contact a poison control center or emergency room at once. NOTE: This medicine is only for you. Do not share this medicine with others. What if I miss a dose? It is important not to miss your dose. Call your doctor or health care professional if you are unable to keep an appointment. What may interact with this medicine? Interactions have not been studied. This list may not describe all possible interactions. Give your health care provider a list of all the medicines, herbs, non-prescription drugs, or dietary supplements you use. Also tell them if you smoke, drink alcohol, or use illegal drugs. Some items may interact with your medicine. What should I watch for while using this medicine? Tell your doctor or healthcare professional if your symptoms do not start to get better or if they get worse. Your condition will be monitored carefully while you are receiving this medicine. You may need blood work done while you are taking this medicine. What side effects may I notice from receiving this medicine? Side effects that you should report to your doctor or health care professional as soon as possible: -allergic reactions like skin rash, itching or hives, swelling of the face, lips, or tongue -black, tarry stools -  bloody or watery diarrhea -changes in vision -chills -cough -depressed mood -eye pain -feeling anxious -fever -general ill feeling or flu-like symptoms -hair loss -loss of appetite -low blood counts -  this medicine may decrease the number of white blood cells, red blood cells and platelets. You may be at increased risk for infections and bleeding -pain, tingling, numbness in the hands or feet -redness, blistering, peeling or loosening of the skin, including inside the mouth -red pinpoint spots on skin -signs of decreased platelets or bleeding - bruising, pinpoint red spots on the skin, black, tarry stools, blood in the urine -signs of decreased red blood cells - unusually weak or tired, feeling faint or lightheaded, falls -signs of infection - fever or chills, cough, sore throat, pain or trouble passing urine -signs and symptoms of a dangerous change in heartbeat or heart rhythm like chest pain; dizziness; fast or irregular heartbeat; palpitations; feeling faint or lightheaded, falls; breathing problems -signs and symptoms of high blood sugar such as dizziness; dry mouth; dry skin; fruity breath; nausea; stomach pain; increased hunger or thirst; increased urination -signs and symptoms of kidney injury like trouble passing urine or change in the amount of urine -signs and symptoms of liver injury like dark yellow or brown urine; general ill feeling or flu-like symptoms; light-colored stools; loss of appetite; nausea; right upper belly pain; unusually weak or tired; yellowing of the eyes or skin -signs and symptoms of increased potassium like muscle weakness; chest pain; or fast, irregular heartbeat -signs and symptoms of low potassium like muscle cramps or muscle pain; chest pain; dizziness; feeling faint or lightheaded, falls; palpitations; breathing problems; or fast, irregular heartbeat -swelling of the ankles, feet, hands -weight gainSide effects that usually do not require medical attention (report to your doctor or health care professional if they continue or are bothersome): -constipation -general ill feeling or flu-like symptoms -hair loss -loss of appetite -nausea, vomiting This list  may not describe all possible side effects. Call your doctor for medical advice about side effects. You may report side effects to FDA at 1-800-FDA-1088. Where should I keep my medicine? This drug is given in a hospital or clinic and will not be stored at home. NOTE: This sheet is a summary. It may not cover all possible information. If you have questions about this medicine, talk to your doctor, pharmacist, or health care provider.  2015, [employer]. (2014-02-10 13:18:19)

## 2015-01-29 ENCOUNTER — Ambulatory Visit: Payer: Medicare Other | Admitting: Radiation Oncology

## 2015-01-30 ENCOUNTER — Telehealth: Payer: Self-pay | Admitting: Medical Oncology

## 2015-01-30 DIAGNOSIS — S52234D Nondisplaced oblique fracture of shaft of right ulna, subsequent encounter for closed fracture with routine healing: Secondary | ICD-10-CM | POA: Diagnosis not present

## 2015-01-30 DIAGNOSIS — M79631 Pain in right forearm: Secondary | ICD-10-CM | POA: Diagnosis not present

## 2015-01-30 NOTE — Telephone Encounter (Signed)
I left message to return my call regarding chemo f/u .

## 2015-02-02 ENCOUNTER — Ambulatory Visit: Payer: Medicare Other | Admitting: Internal Medicine

## 2015-02-02 ENCOUNTER — Telehealth: Payer: Self-pay | Admitting: Medical Oncology

## 2015-02-02 NOTE — Telephone Encounter (Signed)
Just feels tired. Also "fighting a cold at the same time, I don;'t know if its the nivolumab or cold".  I instructed pt to stay hydrated, rest and call for any concerns.

## 2015-02-09 ENCOUNTER — Ambulatory Visit
Admission: RE | Admit: 2015-02-09 | Discharge: 2015-02-09 | Disposition: A | Discharge status: Home / Self Care | Payer: Medicare Other | Source: Ambulatory Visit | Attending: Radiation Oncology | Admitting: Radiation Oncology

## 2015-02-09 ENCOUNTER — Encounter: Payer: Self-pay | Admitting: Radiation Oncology

## 2015-02-09 VITALS — BP 129/63 | HR 70 | Temp 97.9°F | Resp 20 | Ht 72.0 in | Wt 157.6 lb

## 2015-02-09 DIAGNOSIS — C3430 Malignant neoplasm of lower lobe, unspecified bronchus or lung: Secondary | ICD-10-CM

## 2015-02-09 NOTE — Progress Notes (Signed)
Anthony Curry is here for follow up with his wife.  He denies pain.  He is using a cane and reports that he feels "wobbly" especially getting up from a sitting position.  He is not sure if this is from having a cold or from receiving Optivo infusion 2 weeks ago.  He reports he will have an infusion again on Wednesday.  He denies nausea.  He reports a good appetite and his weight is stable.  He denies any skin irritation.  He reports fatigue.    BP 129/63 mmHg  Pulse 70  Temp(Src) 97.9 F (36.6 C) (Oral)  Resp 20  Ht 6' (1.829 m)  Wt 157 lb 9.6 oz (71.487 kg)  BMI 21.37 kg/m2  SpO2 98%

## 2015-02-09 NOTE — Progress Notes (Signed)
Radiation Oncology         [phone] ________________________________  Name: Anthony Curry MRN: [id number]  Date: 02/09/2015  DOB: 1941/05/30  Follow-Up Visit Note  CC: [name], MD  [name], MD    ICD-9-CM ICD-10-CM   1. 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  He seems to be doing reasonably well at this time. The patient denies any pain along the left flank at this time. He did undergo imaging documented below which showed shrinkage of his left adrenal mass from his palliative radiation therapy. His on Optivo now and other than some fatigue and possible stability issues is tolerating well. He will discuss these issues with Dr. [name] later this week.                              ALLERGIES:  has No Known Allergies.  Meds: Current Outpatient Prescriptions  Medication Sig Dispense Refill  . ALPRAZolam (XANAX) 0.25 MG tablet Take 0.25 mg by mouth 3 (three) times daily as needed for anxiety or sleep.    [name] apixaban (ELIQUIS) 5 MG TABS tablet Take 1 tablet (5 mg total) by mouth 2 (two) times daily. 180 tablet 0  . Ascorbic Acid (VITAMIN C PO) Take 1,000 mg by mouth daily.     . B Complex-C (SUPER B COMPLEX PO) Take 1 each by mouth daily.     . Biotin 7500 MCG TABS Take 1 tablet by mouth daily.     . calcium carbonate (OS-CAL) 600 MG TABS Take 600 mg by mouth daily with breakfast.    . demeclocycline (DECLOMYCIN) 150 MG tablet Take 150 mg by mouth 2 (two) times daily.    . digoxin (LANOXIN) 0.125 MG tablet Take 125 mcg by mouth daily.  1  . diltiazem (CARDIZEM CD) 180 MG 24 hr capsule Take 1 capsule (180 mg total) by mouth daily at 12 noon. 90 capsule 3  . HYDROcodone-acetaminophen (NORCO/VICODIN)  5-325 MG per tablet Take 1-2 tablets by mouth every 4 (four) hours as needed (pain). 30 tablet 0  . KLOR-CON M20 20 MEQ tablet     . Lactobacillus (ACIDOPHILUS) CAPS capsule Take 1 capsule by mouth daily.    [name] MAGNESIUM PO Take 400 mg by mouth daily.     . metoprolol tartrate (LOPRESSOR) 25 MG tablet Take 1 tablet (25 mg total) by mouth 2 (two) times daily. 180 tablet 1  . mirtazapine (REMERON) 15 MG tablet Take 1 tablet (15 mg total) by mouth at bedtime. 30 tablet 0  . Multiple Vitamin (MULTIVITAMIN WITH MINERALS) TABS Take 1 tablet by mouth daily.    . Nutritional Supplements (ENSURE COMPLETE PO) Take 1 Can by mouth 2 (two) times daily.    . Omega-3 Fatty Acids (FISH OIL PO) Take 1 tablet by mouth daily.    [name] omeprazole (PRILOSEC) 20 MG capsule Take 20 mg by mouth daily as needed (heartburn).     . tamsulosin (FLOMAX) 0.4 MG CAPS capsule Take 0.4 mg by mouth daily.     [name] thiamine (VITAMIN B-1) 100 MG tablet Take 1 tablet (100 mg total) by mouth daily. 30 tablet 6  . vitamin E 400  UNIT capsule Take 400 Units by mouth daily.    [name] dexamethasone (DECADRON) 4 MG tablet Take 4 mg by mouth See admin instructions. Take 1 tablet twice a day the day before, day of, and day after chemotherapy every three weeks.     No current facility-administered medications for this encounter.    Physical Findings: The patient is in no acute distress. Patient is alert and oriented.  height is 6' (1.829 m) and weight is 157 lb 9.6 oz (71.487 kg). His oral temperature is 97.9 F (36.6 C). His blood pressure is 129/63 and his pulse is 70. His respiration is 20 and oxygen saturation is 98%. .  The lungs are clear. The heart has a regular rhythm and rate. The abdomen is soft and nontender with normal bowel sounds. No palpable adrenal mass on clinical exam.  Lab Findings: Lab Results  Component Value Date   WBC 5.5 01/28/2015   HGB 14.1 01/28/2015   HCT 39.7 01/28/2015   MCV 90.0 01/28/2015   PLT 218 01/28/2015     Radiographic Findings: Ct Chest W Contrast  01/12/2015   CLINICAL DATA:  Subsequent encounter for left-sided lung cancer apparent  EXAM: CT CHEST, ABDOMEN, AND PELVIS WITH CONTRAST  TECHNIQUE: Multidetector CT imaging of the chest, abdomen and pelvis was performed following the standard protocol during bolus administration of intravenous contrast.  CONTRAST:  136mL OMNIPAQUE IOHEXOL 300 MG/ML  SOLN  COMPARISON:  11/10/2014  FINDINGS: CT CHEST FINDINGS  Mediastinum/Nodes: No evidence for axillary or mediastinal lymphadenopathy. No hilar lymphadenopathy. Heart size is normal. Coronary artery calcification is noted. Tiny anterior pericardial effusion is evident.  Lungs/Pleura: Paraseptal and centrilobular emphysema again noted. Spiculated left apical nodule measures 14 x 18 mm today compared to 11 x 17 mm previously. Scattered areas of ground-glass opacity are unchanged. 8 mm nodule in the posteromedial right lower lobe (image 44 series 4) is new in the interval. There is an adjacent new 6 mm nodule, as well. Densely calcified granuloma is identified in the posterior left costophrenic sulcus. 2 mm right lower lobe nodule on image 58 is unchanged.  Musculoskeletal: Patient is status post left shoulder replacement with advanced degenerative changes noted in the right shoulder.  CT ABDOMEN AND PELVIS FINDINGS  Hepatobiliary: The tiny hypervascular lesions in the dome of the liver are stable (exophytic left liver lesion seen on image 61 series 2 and anterior right hepatic lesion seen on image 62 series 2). Scattered small hypo attenuating foci in the liver are also unchanged and likely represent cysts. Tiny layering gallstones are seen in the lumen of the gallbladder. No intrahepatic or extrahepatic biliary dilation.  Pancreas: Pancreas is atrophic without ductal dilatation. No focal pancreatic mass lesion.  Spleen: 5 mm enhancing focus in the dome of the spleen is unchanged.  Adrenals/Urinary Tract: 2.5 x 3.7 cm  left adrenal mass seen previously has decreased in size, now measuring 2.1 x 1.3 cm No nodule or mass in the right adrenal. Areas of cortical scarring are seen in both kidneys without enhancing renal mass lesion. No evidence for hydroureter. The bladder is distended with bladder wall trabeculation and scattered small bladder diverticuli.  Stomach/Bowel: Stomach is nondistended. No gastric wall thickening. No evidence of outlet obstruction. Small distal duodenum diverticulum noted. No small bowel wall thickening. No small bowel dilatation. Terminal ileum is normal. The appendix is not visualized, but there is no edema or inflammation in the region of the cecum. No gross colonic mass. No colonic wall thickening.  No substantial diverticular change.  Vascular/Lymphatic: Atherosclerotic calcification is noted in the wall of the abdominal aorta without aneurysm. No gastrohepatic or hepatoduodenal ligament lymphadenopathy. No retroperitoneal lymphadenopathy. No evidence for pelvic sidewall lymphadenopathy.  Reproductive: Prostate gland is not enlarged. Seminal vesicles are unremarkable.  Other: No evidence for intraperitoneal free fluid.  Musculoskeletal: Tiny sclerotic foci in the left iliac bone are unchanged and likely benign. Degenerative changes are noted in the lumbar spine. Nonacute right-sided rib fractures are evident.  IMPRESSION: 1. 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 2. The spiculated nodule in the medial left lung apex has increased in size in the interval. Adjacent small nodules are seen in the right lower lobe of indeterminate etiology. Attention on followup imaging is recommended. 3. Stable appearance of small hypervascular lesions in the liver and spleen. 4. Interval decrease in small right pleural effusion. 5. Cholelithiasis. 6. Atherosclerosis.   Electronically Signed   By: Misty Stanley M.D.   On: 01/12/2015 12:08   Ct Abdomen Pelvis W  Contrast  01/12/2015   CLINICAL DATA:  Subsequent encounter for left-sided lung cancer apparent  EXAM: CT CHEST, ABDOMEN, AND PELVIS WITH CONTRAST  TECHNIQUE: Multidetector CT imaging of the chest, abdomen and pelvis was performed following the standard protocol during bolus administration of intravenous contrast.  CONTRAST:  165mL OMNIPAQUE IOHEXOL 300 MG/ML  SOLN  COMPARISON:  11/10/2014  FINDINGS: CT CHEST FINDINGS  Mediastinum/Nodes: No evidence for axillary or mediastinal lymphadenopathy. No hilar lymphadenopathy. Heart size is normal. Coronary artery calcification is noted. Tiny anterior pericardial effusion is evident.  Lungs/Pleura: Paraseptal and centrilobular emphysema again noted. Spiculated left apical nodule measures 14 x 18 mm today compared to 11 x 17 mm previously. Scattered areas of ground-glass opacity are unchanged. 8 mm nodule in the posteromedial right lower lobe (image 44 series 4) is new in the interval. There is an adjacent new 6 mm nodule, as well. Densely calcified granuloma is identified in the posterior left costophrenic sulcus. 2 mm right lower lobe nodule on image 58 is unchanged.  Musculoskeletal: Patient is status post left shoulder replacement with advanced degenerative changes noted in the right shoulder.  CT ABDOMEN AND PELVIS FINDINGS  Hepatobiliary: The tiny hypervascular lesions in the dome of the liver are stable (exophytic left liver lesion seen on image 61 series 2 and anterior right hepatic lesion seen on image 62 series 2). Scattered small hypo attenuating foci in the liver are also unchanged and likely represent cysts. Tiny layering gallstones are seen in the lumen of the gallbladder. No intrahepatic or extrahepatic biliary dilation.  Pancreas: Pancreas is atrophic without ductal dilatation. No focal pancreatic mass lesion.  Spleen: 5 mm enhancing focus in the dome of the spleen is unchanged.  Adrenals/Urinary Tract: 2.5 x 3.7 cm left adrenal mass seen previously has  decreased in size, now measuring 2.1 x 1.3 cm No nodule or mass in the right adrenal. Areas of cortical scarring are seen in both kidneys without enhancing renal mass lesion. No evidence for hydroureter. The bladder is distended with bladder wall trabeculation and scattered small bladder diverticuli.  Stomach/Bowel: Stomach is nondistended. No gastric wall thickening. No evidence of outlet obstruction. Small distal duodenum diverticulum noted. No small bowel wall thickening. No small bowel dilatation. Terminal ileum is normal. The appendix is not visualized, but there is no edema or inflammation in the region of the cecum. No gross colonic mass. No colonic wall thickening. No substantial diverticular change.  Vascular/Lymphatic: Atherosclerotic calcification  is noted in the wall of the abdominal aorta without aneurysm. No gastrohepatic or hepatoduodenal ligament lymphadenopathy. No retroperitoneal lymphadenopathy. No evidence for pelvic sidewall lymphadenopathy.  Reproductive: Prostate gland is not enlarged. Seminal vesicles are unremarkable.  Other: No evidence for intraperitoneal free fluid.  Musculoskeletal: Tiny sclerotic foci in the left iliac bone are unchanged and likely benign. Degenerative changes are noted in the lumbar spine. Nonacute right-sided rib fractures are evident.  IMPRESSION: 1. 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 2. The spiculated nodule in the medial left lung apex has increased in size in the interval. Adjacent small nodules are seen in the right lower lobe of indeterminate etiology. Attention on followup imaging is recommended. 3. Stable appearance of small hypervascular lesions in the liver and spleen. 4. Interval decrease in small right pleural effusion. 5. Cholelithiasis. 6. Atherosclerosis.   Electronically Signed   By: Misty Stanley M.D.   On: 01/12/2015 12:08    Impression:  The patient is recovering from the effects of radiation.  Favorable response to palliative radiation therapy with pain improvement and CT scan showing shrinkage  Plan:  When necessary follow-up in radiation oncology. The patient will continue close follow-up with medical oncology.  ____________________________________ Blair Promise, MD

## 2015-02-11 ENCOUNTER — Other Ambulatory Visit (HOSPITAL_BASED_OUTPATIENT_CLINIC_OR_DEPARTMENT_OTHER): Payer: Medicare Other

## 2015-02-11 ENCOUNTER — Ambulatory Visit (HOSPITAL_BASED_OUTPATIENT_CLINIC_OR_DEPARTMENT_OTHER): Payer: Medicare Other

## 2015-02-11 ENCOUNTER — Telehealth: Payer: Self-pay | Admitting: *Deleted

## 2015-02-11 ENCOUNTER — Ambulatory Visit (HOSPITAL_BASED_OUTPATIENT_CLINIC_OR_DEPARTMENT_OTHER): Payer: Medicare Other | Admitting: Physician Assistant

## 2015-02-11 ENCOUNTER — Encounter: Payer: Self-pay | Admitting: Physician Assistant

## 2015-02-11 ENCOUNTER — Telehealth: Payer: Self-pay | Admitting: Physician Assistant

## 2015-02-11 VITALS — BP 149/63 | HR 86 | Temp 98.1°F | Resp 18 | Ht 72.0 in | Wt 158.2 lb

## 2015-02-11 DIAGNOSIS — E871 Hypo-osmolality and hyponatremia: Secondary | ICD-10-CM | POA: Diagnosis not present

## 2015-02-11 DIAGNOSIS — C3431 Malignant neoplasm of lower lobe, right bronchus or lung: Secondary | ICD-10-CM

## 2015-02-11 DIAGNOSIS — Z5112 Encounter for antineoplastic immunotherapy: Secondary | ICD-10-CM

## 2015-02-11 DIAGNOSIS — C3492 Malignant neoplasm of unspecified part of left bronchus or lung: Secondary | ICD-10-CM

## 2015-02-11 LAB — CBC WITH DIFFERENTIAL/PLATELET
BASO%: 0.6 % (ref 0.0–2.0)
Basophils Absolute: 0 10*3/uL (ref 0.0–0.1)
EOS ABS: 0.2 10*3/uL (ref 0.0–0.5)
EOS%: 5 % (ref 0.0–7.0)
HCT: 40.6 % (ref 38.4–49.9)
HGB: 13.5 g/dL (ref 13.0–17.1)
LYMPH#: 0.9 10*3/uL (ref 0.9–3.3)
LYMPH%: 20.1 % (ref 14.0–49.0)
MCH: 31.6 pg (ref 27.2–33.4)
MCHC: 33.2 g/dL (ref 32.0–36.0)
MCV: 95.3 fL (ref 79.3–98.0)
MONO#: 0.5 10*3/uL (ref 0.1–0.9)
MONO%: 11.7 % (ref 0.0–14.0)
NEUT%: 62.6 % (ref 39.0–75.0)
NEUTROS ABS: 2.8 10*3/uL (ref 1.5–6.5)
Platelets: 253 10*3/uL (ref 140–400)
RBC: 4.27 10*6/uL (ref 4.20–5.82)
RDW: 15.1 % — ABNORMAL HIGH (ref 11.0–14.6)
WBC: 4.4 10*3/uL (ref 4.0–10.3)

## 2015-02-11 LAB — COMPREHENSIVE METABOLIC PANEL (CC13)
ALK PHOS: 110 U/L (ref 40–150)
ALT: 16 U/L (ref 0–55)
ANION GAP: 10 meq/L (ref 3–11)
AST: 21 U/L (ref 5–34)
Albumin: 3.6 g/dL (ref 3.5–5.0)
BUN: 12.6 mg/dL (ref 7.0–26.0)
CALCIUM: 9.3 mg/dL (ref 8.4–10.4)
CHLORIDE: 100 meq/L (ref 98–109)
CO2: 23 meq/L (ref 22–29)
Creatinine: 0.7 mg/dL (ref 0.7–1.3)
EGFR: >90 mL/min/{1.73_m2} (ref >90)
GLUCOSE: 130 mg/dL (ref 70–140)
Potassium: 3.9 mEq/L (ref 3.5–5.1)
SODIUM: 132 meq/L — AB (ref 136–145)
Total Bilirubin: 0.52 mg/dL (ref 0.20–1.20)
Total Protein: 6.1 g/dL — ABNORMAL LOW (ref 6.4–8.3)

## 2015-02-11 MED ORDER — SODIUM CHLORIDE 0.9 % IV SOLN
3.0000 mg/kg | Freq: Once | INTRAVENOUS | Status: AC
  Administered 2015-02-11: 220 mg via INTRAVENOUS
  Filled 2015-02-11: qty 22

## 2015-02-11 MED ORDER — SODIUM CHLORIDE 0.9 % IV SOLN
Freq: Once | INTRAVENOUS | Status: AC
  Administered 2015-02-11: 13:00:00 via INTRAVENOUS

## 2015-02-11 NOTE — Telephone Encounter (Signed)
Pt confirmed labs/ov per 03/09 POF, gave pt AVS.... KJ, sent msg to add chemo and move chemo down

## 2015-02-11 NOTE — Progress Notes (Addendum)
[facility] Telephone:(336) [phone]   Fax:(336) [place], MD [place] [address]  DIAGNOSIS: Stage IV (T1 A., N0, M1 B.) non-small cell lung cancer, adenocarcinoma with negative EGFR mutation and negative ALK gene translocation diagnosed in October of 2014.   Molecular profile: Negative for RET, ALK, BRAF, MET, EGFR.  Positive for ERBB2 A775T, [id number], KRAS G13E, MAP2K1 D67N (see full report).   PRIOR THERAPY:  1) Systemic chemotherapy with carboplatin for AUC of 5 and Alimta 500 mg/M2 every 3 weeks, status post 6 cycles with stable disease. First dose on 10/23/2013. 2) Maintenance systemic chemotherapy with single agent Alimta 500 mg/M2 every 3 weeks. Status post 8 cycles.   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 Sondra Come completed on 12/16/2014.  CURRENT THERAPY: Immunotherapy with Nivolumab 3 MG/KG every 2 weeks. First cycle 01/28/2015. Status post 1 cycle  CHEMOTHERAPY INTENT: Palliative  CURRENT # OF CHEMOTHERAPY CYCLES: 2 CURRENT ANTIEMETICS: Zofran, Compazine and dexamethasone  CURRENT SMOKING STATUS: Former smoker  ORAL CHEMOTHERAPY AND CONSENT: None  CURRENT BISPHOSPHONATES USE: None  PAIN MANAGEMENT: 0/10  NARCOTICS INDUCED CONSTIPATION: None  LIVING WILL AND CODE STATUS: ?   INTERVAL HISTORY: Trace Wirick 74 y.o. male returns to the clinic today for followup visit accompanied by his wife. He tolerated his first cycle of immunotherapy with newly map relatively well. He does report some unsteadiness/instability on his feet however he had a bad cold at the time. His cold symptoms are resolving. He had one episode of diarrhea that occurred 2 days after his treatment. He took Imodium with complete resolution and no further episodes. He denied any issues with shortness of breath, other episodes of diarrhea or skin rash. He is not eating  as regularly as he should and is taking steps to rectify this.. He denied having any significant fever or chills, no nausea or vomiting. He presents to proceed with cycle #2 of immunotherapy with Nivolumab. Patient reports that he knows why for him to take a cruise starting 02/28/2015. When they return from the cruise the plan on spending a week in [place] and would like to reschedule his treatment that is due on 03/11/2015 to 03/18/2015.  MEDICAL HISTORY: Past Medical History  Diagnosis Date  . Hypertension   . GERD (gastroesophageal reflux disease)   . Alcohol abuse   . 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 Low sodium   . Cancer     small cell/lung  . Persistent atrial fibrillation 04/11/14    chads2vasc score is 2  . Hypotension   . Atrial fibrillation with RVR   . HCAP (healthcare-associated pneumonia)   . Adrenal hemorrhage   . BPH (benign prostatic hyperplasia)   . Hyponatremia   . Anemia of chronic disease   . Fall   . Alcohol intoxication   . Pulmonary nodules   . Adrenal mass   . Atrial flutter by electrocardiogram   . Neoplastic malignant related fatigue   . 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 Alcoholism in remission   . Radiation 12/02/14-12/16/14    Left adrenal gland 30 Gy in 10 fractions    ALLERGIES:  has No Known Allergies.  MEDICATIONS:  Current Outpatient Prescriptions  Medication Sig Dispense Refill  . ALPRAZolam (XANAX) 0.25 MG tablet Take 0.25 mg by mouth 3 (three) times daily as needed for anxiety or sleep.    [name]  apixaban (ELIQUIS) 5 MG TABS tablet Take 1 tablet (5 mg total) by mouth 2 (two) times daily. 180 tablet 0  . Ascorbic Acid (VITAMIN C PO) Take 1,000 mg by mouth daily.     . B Complex-C (SUPER B COMPLEX PO) Take 1 each by mouth daily.     . Biotin 7500 MCG TABS Take 1 tablet by mouth daily.     . calcium carbonate (OS-CAL) 600 MG TABS Take 600 mg by mouth daily with breakfast.    . demeclocycline (DECLOMYCIN) 150 MG tablet  Take 150 mg by mouth 2 (two) times daily.    . digoxin (LANOXIN) 0.125 MG tablet Take 125 mcg by mouth daily.  1  . diltiazem (CARDIZEM CD) 180 MG 24 hr capsule Take 1 capsule (180 mg total) by mouth daily at 12 noon. 90 capsule 3  . HYDROcodone-acetaminophen (NORCO/VICODIN) 5-325 MG per tablet Take 1-2 tablets by mouth every 4 (four) hours as needed (pain). 30 tablet 0  . KLOR-CON M20 20 MEQ tablet     . Lactobacillus (ACIDOPHILUS) CAPS capsule Take 1 capsule by mouth daily.    [name] MAGNESIUM PO Take 400 mg by mouth daily.     . metoprolol tartrate (LOPRESSOR) 25 MG tablet Take 1 tablet (25 mg total) by mouth 2 (two) times daily. 180 tablet 1  . mirtazapine (REMERON) 15 MG tablet Take 1 tablet (15 mg total) by mouth at bedtime. 30 tablet 0  . Multiple Vitamin (MULTIVITAMIN WITH MINERALS) TABS Take 1 tablet by mouth daily.    . Nutritional Supplements (ENSURE COMPLETE PO) Take 1 Can by mouth 2 (two) times daily.    . Omega-3 Fatty Acids (FISH OIL PO) Take 1 tablet by mouth daily.    [name] omeprazole (PRILOSEC) 20 MG capsule Take 20 mg by mouth daily as needed (heartburn).     . tamsulosin (FLOMAX) 0.4 MG CAPS capsule Take 0.4 mg by mouth daily.     [name] thiamine (VITAMIN B-1) 100 MG tablet Take 1 tablet (100 mg total) by mouth daily. 30 tablet 6  . vitamin E 400 UNIT capsule Take 400 Units by mouth daily.    [name] dexamethasone (DECADRON) 4 MG tablet Take 4 mg by mouth See admin instructions. Take 1 tablet twice a day the day before, day of, and day after chemotherapy every three weeks.     No current facility-administered medications for this visit.    SURGICAL HISTORY:  Past Surgical History  Procedure Laterality Date  . Hemorrhoid surgery    . Ankle arthrodesis  1995    rt fx-hardware in  . Tonsillectomy    . Colonoscopy    . Excision / curettage bone cyst finger  2005    rt thumb  . Total shoulder arthroplasty  02/03/2012    Procedure: TOTAL SHOULDER ARTHROPLASTY;  Surgeon: [name], MD;   Location: [facility];  Service: Orthopedics;  Laterality: Left;  . Cardioversion N/A 09/29/2014    Procedure: CARDIOVERSION;  Surgeon: Josue Hector, MD;  Location: [facility];  Service: Cardiovascular;  Laterality: N/A;    REVIEW OF SYSTEMS:  Constitutional: positive for fatigue Eyes: negative Ears, nose, mouth, throat, and face: negative Respiratory: negative Cardiovascular: negative Gastrointestinal: negative Genitourinary:negative Integument/breast: negative Hematologic/lymphatic: negative Musculoskeletal:negative Neurological: negative Behavioral/Psych: negative Endocrine: negative Allergic/Immunologic: negative   PHYSICAL EXAMINATION: General appearance: alert, cooperative, fatigued and no distress Head: Normocephalic, without obvious abnormality, atraumatic Neck: no adenopathy, no JVD, supple, symmetrical, trachea midline and thyroid not enlarged,  symmetric, no tenderness/mass/nodules Lymph nodes: Cervical, supraclavicular, and axillary nodes normal. Resp: clear to auscultation bilaterally Back: symmetric, no curvature. ROM normal. No CVA tenderness. Cardio: regular rate and rhythm, S1, S2 normal, no murmur, click, rub or gallop GI: soft, non-tender; bowel sounds normal; no masses,  no organomegaly Extremities: extremities normal, atraumatic, no cyanosis or edema Neurologic: Alert and oriented X 3, normal strength and tone. Normal symmetric reflexes. Normal coordination and gait  ECOG PERFORMANCE STATUS: 1 - Symptomatic but completely ambulatory  Blood pressure 149/63, pulse 86, temperature 98.1 F (36.7 C), temperature source Oral, resp. rate 18, height 6' (1.829 m), weight 158 lb 3.2 oz (71.759 kg).  LABORATORY DATA: Lab Results  Component Value Date   WBC 4.4 02/11/2015   HGB 13.5 02/11/2015   HCT 40.6 02/11/2015   MCV 95.3 02/11/2015   PLT 253 02/11/2015      Chemistry      Component Value Date/Time   NA 132* 02/11/2015 1059   NA 132*  09/29/2014 1115   K 3.9 02/11/2015 1059   K 3.3* 09/29/2014 1115   CL 91* 09/22/2014 1122   CO2 23 02/11/2015 1059   CO2 30 09/22/2014 1122   BUN 12.6 02/11/2015 1059   BUN 21 09/22/2014 1122   CREATININE 0.7 02/11/2015 1059   CREATININE 0.8 09/22/2014 1122      Component Value Date/Time   CALCIUM 9.3 02/11/2015 1059   CALCIUM 8.9 09/22/2014 1122   ALKPHOS 110 02/11/2015 1059   ALKPHOS 100 09/02/2014 1400   AST 21 02/11/2015 1059   AST 25 09/02/2014 1400   ALT 16 02/11/2015 1059   ALT 15 09/02/2014 1400   BILITOT 0.52 02/11/2015 1059   BILITOT 0.8 09/02/2014 1400       RADIOGRAPHIC STUDIES: No results found. ASSESSMENT AND PLAN: This is a very pleasant 74 years old white male with:  1)  stage IV non-small cell lung cancer, adenocarcinoma status post induction chemotherapy with carboplatin and Alimta and currently on maintenance treatment with single agent Alimta status post 8 cycles. He has been observation for the last few months.  He had evidence for disease progression on the left adrenal gland and the patient underwent palliative radiotherapy to the left adrenal gland under the care of Dr. Sondra Come completed on 12/16/2014. The recent CT scan of the chest, abdomen and pelvis showed improvement in the left adrenal gland metastasis. There was also mild increase in the size of the spiculated left upper lobe nodule as well as questionable new small nodules in the right lung. Overall he tolerated his first cycle of immunotherapy with notable map relatively well. Patient was discussed with and also seen by Dr. [name]. He will proceed with cycle #2 today as scheduled. We will adjust his April schedule accordingly so he can enjoy his cruise in time spent with his family in [place]. He'll follow-up in 2 weeks prior to cycle #3.   2) atrial fibrillation: He'll continue his current medication with Cardizem, digoxin and metoprolol in addition to Eliquis. He'll also continue his routine  followup visit with his cardiologist Dr. [name].  He was advised to call immediately if he has any concerning symptoms in the interval. The patient voices understanding of current disease status and treatment options and is in agreement with the current care plan.  All questions were answered. The patient knows to call the clinic with any problems, questions or concerns. We can certainly see the patient much sooner if necessary.  Wynetta Emery, [place] E, PA-C  ADDENDUM: Hematology/Oncology Attending: I had a face to face encounter with the patient. I recommended his care plan. This is a very pleasant 74 years old white male with metastatic non-small cell lung cancer, adenocarcinoma currently undergoing immunotherapy with Nivolumab 3 MG/KG every 2 weeks. He is status post 1 cycle and tolerated the first cycle of his treatment fairly well with no significant adverse effects. I recommended for the patient to proceed with cycle #2 today as scheduled. He would come back for follow-up visit in 2 weeks with the start of the next cycle of his immunotherapy. The patient was advised to call immediately if he has any concerning symptoms in the interval.  Disclaimer: This note was dictated with voice recognition software. Similar sounding words can inadvertently be transcribed and may not be corrected upon review. Eilleen Kempf., MD 02/15/2015

## 2015-02-11 NOTE — Patient Instructions (Signed)
[facility] Discharge Instructions for Patients Receiving Chemotherapy  Today you received the following chemotherapy agents Nivolumab  To help prevent nausea and vomiting after your treatment, we encourage you to take your nausea medication as prescribed.  If you develop nausea and vomiting that is not controlled by your nausea medication, call the clinic.   BELOW ARE SYMPTOMS THAT SHOULD BE REPORTED IMMEDIATELY:  *FEVER GREATER THAN 100.5 F  *CHILLS WITH OR WITHOUT FEVER  NAUSEA AND VOMITING THAT IS NOT CONTROLLED WITH YOUR NAUSEA MEDICATION  *UNUSUAL SHORTNESS OF BREATH  *UNUSUAL BRUISING OR BLEEDING  TENDERNESS IN MOUTH AND THROAT WITH OR WITHOUT PRESENCE OF ULCERS  *URINARY PROBLEMS  *BOWEL PROBLEMS  UNUSUAL RASH Items with * indicate a potential emergency and should be followed up as soon as possible.  Feel free to call the clinic you have any questions or concerns. The clinic phone number is [phone].

## 2015-02-11 NOTE — Telephone Encounter (Signed)
Per staff message and POF I have scheduled appts. Advised scheduler of appts. JMW  

## 2015-02-13 NOTE — Patient Instructions (Signed)
Follow-up in 2 weeks

## 2015-02-25 ENCOUNTER — Ambulatory Visit: Payer: BLUE CROSS/BLUE SHIELD

## 2015-02-25 ENCOUNTER — Other Ambulatory Visit: Payer: BLUE CROSS/BLUE SHIELD

## 2015-02-25 ENCOUNTER — Ambulatory Visit (HOSPITAL_BASED_OUTPATIENT_CLINIC_OR_DEPARTMENT_OTHER): Payer: Medicare Other

## 2015-02-25 ENCOUNTER — Encounter: Payer: Self-pay | Admitting: Physician Assistant

## 2015-02-25 ENCOUNTER — Encounter: Payer: Self-pay | Admitting: *Deleted

## 2015-02-25 ENCOUNTER — Other Ambulatory Visit (HOSPITAL_BASED_OUTPATIENT_CLINIC_OR_DEPARTMENT_OTHER): Payer: Medicare Other

## 2015-02-25 ENCOUNTER — Ambulatory Visit (HOSPITAL_BASED_OUTPATIENT_CLINIC_OR_DEPARTMENT_OTHER): Payer: Medicare Other | Admitting: Physician Assistant

## 2015-02-25 VITALS — BP 137/62 | HR 66 | Temp 98.1°F | Resp 18 | Ht 72.0 in | Wt 156.3 lb

## 2015-02-25 DIAGNOSIS — C3431 Malignant neoplasm of lower lobe, right bronchus or lung: Secondary | ICD-10-CM

## 2015-02-25 DIAGNOSIS — Z5112 Encounter for antineoplastic immunotherapy: Secondary | ICD-10-CM | POA: Diagnosis not present

## 2015-02-25 DIAGNOSIS — I4891 Unspecified atrial fibrillation: Secondary | ICD-10-CM | POA: Diagnosis not present

## 2015-02-25 DIAGNOSIS — Z79899 Other long term (current) drug therapy: Secondary | ICD-10-CM | POA: Diagnosis present

## 2015-02-25 DIAGNOSIS — C3410 Malignant neoplasm of upper lobe, unspecified bronchus or lung: Secondary | ICD-10-CM

## 2015-02-25 DIAGNOSIS — C3492 Malignant neoplasm of unspecified part of left bronchus or lung: Secondary | ICD-10-CM

## 2015-02-25 DIAGNOSIS — C7972 Secondary malignant neoplasm of left adrenal gland: Secondary | ICD-10-CM | POA: Diagnosis not present

## 2015-02-25 DIAGNOSIS — R5382 Chronic fatigue, unspecified: Secondary | ICD-10-CM

## 2015-02-25 LAB — CBC WITH DIFFERENTIAL/PLATELET
BASO%: 0.7 % (ref 0.0–2.0)
Basophils Absolute: 0 10*3/uL (ref 0.0–0.1)
EOS ABS: 0.3 10*3/uL (ref 0.0–0.5)
EOS%: 9.3 % — ABNORMAL HIGH (ref 0.0–7.0)
HEMATOCRIT: 41.6 % (ref 38.4–49.9)
HGB: 14.2 g/dL (ref 13.0–17.1)
LYMPH%: 25.5 % (ref 14.0–49.0)
MCH: 32.3 pg (ref 27.2–33.4)
MCHC: 34.2 g/dL (ref 32.0–36.0)
MCV: 94.7 fL (ref 79.3–98.0)
MONO#: 0.6 10*3/uL (ref 0.1–0.9)
MONO%: 16.1 % — AB (ref 0.0–14.0)
NEUT#: 1.8 10*3/uL (ref 1.5–6.5)
NEUT%: 48.4 % (ref 39.0–75.0)
PLATELETS: 234 10*3/uL (ref 140–400)
RBC: 4.4 10*6/uL (ref 4.20–5.82)
RDW: 15.7 % — AB (ref 11.0–14.6)
WBC: 3.8 10*3/uL — ABNORMAL LOW (ref 4.0–10.3)
lymph#: 1 10*3/uL (ref 0.9–3.3)

## 2015-02-25 LAB — COMPREHENSIVE METABOLIC PANEL (CC13)
ALBUMIN: 3.7 g/dL (ref 3.5–5.0)
ALT: 16 U/L (ref 0–55)
AST: 24 U/L (ref 5–34)
Alkaline Phosphatase: 117 U/L (ref 40–150)
Anion Gap: 10 mEq/L (ref 3–11)
BUN: 10.9 mg/dL (ref 7.0–26.0)
CALCIUM: 9.3 mg/dL (ref 8.4–10.4)
CHLORIDE: 100 meq/L (ref 98–109)
CO2: 26 mEq/L (ref 22–29)
Creatinine: 0.7 mg/dL (ref 0.7–1.3)
Glucose: 101 mg/dl (ref 70–140)
Potassium: 3.6 mEq/L (ref 3.5–5.1)
SODIUM: 135 meq/L — AB (ref 136–145)
TOTAL PROTEIN: 6.3 g/dL — AB (ref 6.4–8.3)
Total Bilirubin: 0.97 mg/dL (ref 0.20–1.20)

## 2015-02-25 LAB — TSH CHCC: TSH: 2.061 m[IU]/L (ref 0.320–4.118)

## 2015-02-25 MED ORDER — SODIUM CHLORIDE 0.9 % IV SOLN
Freq: Once | INTRAVENOUS | Status: AC
  Administered 2015-02-25: 14:00:00 via INTRAVENOUS

## 2015-02-25 MED ORDER — CEPHALEXIN 500 MG PO CAPS: 500.0000 mg | ORAL_CAPSULE | Freq: Four times a day (QID) | ORAL | Status: DC

## 2015-02-25 MED ORDER — SODIUM CHLORIDE 0.9 % IV SOLN
3.0000 mg/kg | Freq: Once | INTRAVENOUS | Status: AC
  Administered 2015-02-25: 220 mg via INTRAVENOUS
  Filled 2015-02-25: qty 22

## 2015-02-25 NOTE — Progress Notes (Addendum)
[facility] Telephone:([phone]   Fax:(336) [place], [place] [address]  DIAGNOSIS: Stage IV (T1 A., N0, M1 B.) non-small cell lung cancer, adenocarcinoma with negative EGFR mutation and negative ALK gene translocation diagnosed in October of 2014.   Molecular profile: Negative for RET, ALK, BRAF, MET, EGFR.  Positive for ERBB2 A775T, [id number], KRAS G13E, MAP2K1 D67N (see full report).   PRIOR THERAPY:  1) Systemic chemotherapy with carboplatin for AUC of 5 and Alimta 500 mg/M2 every 3 weeks, status post 6 cycles with stable disease. First dose on 10/23/2013. 2) Maintenance systemic chemotherapy with single agent Alimta 500 mg/M2 every 3 weeks. Status post 8 cycles.   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 Sondra Come completed on 12/16/2014.  CURRENT THERAPY: Immunotherapy with Nivolumab 3 MG/KG every 2 weeks. First cycle 01/28/2015. Status post 2 cycles  CHEMOTHERAPY INTENT: Palliative  CURRENT # OF CHEMOTHERAPY CYCLES: 3 CURRENT ANTIEMETICS: Zofran, Compazine and dexamethasone  CURRENT SMOKING STATUS: Former smoker  ORAL CHEMOTHERAPY AND CONSENT: None  CURRENT BISPHOSPHONATES USE: None  PAIN MANAGEMENT: 0/10  NARCOTICS INDUCED CONSTIPATION: None  LIVING WILL AND CODE STATUS: ?   INTERVAL HISTORY: Anthony Curry 74 y.o. male returns to the clinic today for followup visit accompanied by his wife. He tolerated his first 2 cycles of immunotherapy with Nivolumab relatively well. He continues to report some unsteadiness/instability on his feet and is using a cane to help with ambulation. He and his family are leaving Friday to drive to [place] to take a week long cruise. He requests a letter that states he is stable form a medical standpoint to take the cruise.He denied any episodes of diarrhea. He denied any issues with shortness of breath,  other episodes of diarrhea or skin rash. He is not eating as regularly as he should and is taking steps to rectify this.. He denied having any significant fever or chills, no nausea or vomiting. He presents to proceed with cycle #3 of immunotherapy with Nivolumab.  MEDICAL HISTORY: Past Medical History  Diagnosis Date  . Hypertension   . GERD (gastroesophageal reflux disease)   . Alcohol abuse   . 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 Low sodium   . Cancer     small cell/lung  . Persistent atrial fibrillation 04/11/14    chads2vasc score is 2  . Hypotension   . Atrial fibrillation with RVR   . HCAP (healthcare-associated pneumonia)   . Adrenal hemorrhage   . BPH (benign prostatic hyperplasia)   . Hyponatremia   . Anemia of chronic disease   . Fall   . Alcohol intoxication   . Pulmonary nodules   . Adrenal mass   . Atrial flutter by electrocardiogram   . Neoplastic malignant related fatigue   . 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 Alcoholism in remission   . Radiation 12/02/14-12/16/14    Left adrenal gland 30 Gy in 10 fractions    ALLERGIES:  has No Known Allergies.  MEDICATIONS:  Current Outpatient Prescriptions  Medication Sig Dispense Refill  . ALPRAZolam (XANAX) 0.25 MG tablet Take 0.25 mg by mouth 3 (three) times daily as needed for anxiety or sleep.    [name] apixaban (ELIQUIS) 5 MG TABS tablet Take 1 tablet (5 mg total) by mouth 2 (two) times daily. 180 tablet 0  . Ascorbic Acid (VITAMIN C PO) Take  1,000 mg by mouth daily.     . B Complex-C (SUPER B COMPLEX PO) Take 1 each by mouth daily.     . Biotin 7500 MCG TABS Take 1 tablet by mouth daily.     . calcium carbonate (OS-CAL) 600 MG TABS Take 600 mg by mouth daily with breakfast.    . demeclocycline (DECLOMYCIN) 150 MG tablet Take 150 mg by mouth 2 (two) times daily.    [name] dexamethasone (DECADRON) 4 MG tablet Take 4 mg by mouth See admin instructions. Take 1 tablet twice a day the day before, day of,  and day after chemotherapy every three weeks.    . digoxin (LANOXIN) 0.125 MG tablet Take 125 mcg by mouth daily.  1  . diltiazem (CARDIZEM CD) 180 MG 24 hr capsule Take 1 capsule (180 mg total) by mouth daily at 12 noon. 90 capsule 3  . HYDROcodone-acetaminophen (NORCO/VICODIN) 5-325 MG per tablet Take 1-2 tablets by mouth every 4 (four) hours as needed (pain). 30 tablet 0  . KLOR-CON M20 20 MEQ tablet     . Lactobacillus (ACIDOPHILUS) CAPS capsule Take 1 capsule by mouth daily.    [name] MAGNESIUM PO Take 400 mg by mouth daily.     . metoprolol tartrate (LOPRESSOR) 25 MG tablet Take 1 tablet (25 mg total) by mouth 2 (two) times daily. 180 tablet 1  . mirtazapine (REMERON) 15 MG tablet Take 1 tablet (15 mg total) by mouth at bedtime. 30 tablet 0  . Multiple Vitamin (MULTIVITAMIN WITH MINERALS) TABS Take 1 tablet by mouth daily.    . Nutritional Supplements (ENSURE COMPLETE PO) Take 1 Can by mouth 2 (two) times daily.    . Omega-3 Fatty Acids (FISH OIL PO) Take 1 tablet by mouth daily.    [name] omeprazole (PRILOSEC) 20 MG capsule Take 20 mg by mouth daily as needed (heartburn).     . tamsulosin (FLOMAX) 0.4 MG CAPS capsule Take 0.4 mg by mouth daily.     [name] thiamine (VITAMIN B-1) 100 MG tablet Take 1 tablet (100 mg total) by mouth daily. 30 tablet 6  . vitamin E 400 UNIT capsule Take 400 Units by mouth daily.    . cephALEXin (KEFLEX) 500 MG capsule Take 1 capsule (500 mg total) by mouth 4 (four) times daily. 28 capsule 7   No current facility-administered medications for this visit.    SURGICAL HISTORY:  Past Surgical History  Procedure Laterality Date  . Hemorrhoid surgery    . Ankle arthrodesis  1995    rt fx-hardware in  . Tonsillectomy    . Colonoscopy    . Excision / curettage bone cyst finger  2005    rt thumb  . Total shoulder arthroplasty  02/03/2012    Procedure: TOTAL SHOULDER ARTHROPLASTY;  Surgeon: [name], MD;  Location: [facility];  Service: Orthopedics;   Laterality: Left;  . Cardioversion N/A 09/29/2014    Procedure: CARDIOVERSION;  Surgeon: Josue Hector, MD;  Location: [facility] ENDOSCOPY;  Service: Cardiovascular;  Laterality: N/A;    REVIEW OF SYSTEMS:  Constitutional: positive for fatigue Eyes: negative Ears, nose, mouth, throat, and face: negative Respiratory: negative Cardiovascular: negative Gastrointestinal: negative Genitourinary:negative Integument/breast: negative Hematologic/lymphatic: negative Musculoskeletal:negative Neurological: positive for gait problems Behavioral/Psych: negative Endocrine: negative Allergic/Immunologic: negative   PHYSICAL EXAMINATION: General appearance: alert, cooperative, fatigued and no distress Head: Normocephalic, without obvious abnormality, atraumatic Neck: no adenopathy, no JVD, supple, symmetrical, trachea midline and thyroid not enlarged, symmetric, no tenderness/mass/nodules Lymph  nodes: Cervical, supraclavicular, and axillary nodes normal. Resp: clear to auscultation bilaterally Back: symmetric, no curvature. ROM normal. No CVA tenderness. Cardio: regular rate and rhythm, S1, S2 normal, no murmur, click, rub or gallop GI: soft, non-tender; bowel sounds normal; no masses,  no organomegaly Extremities: extremities normal, atraumatic, no cyanosis or edema Neurologic: Alert and oriented X 3, normal strength and tone. Normal symmetric reflexes. Normal coordination and gait  ECOG PERFORMANCE STATUS: 1 - Symptomatic but completely ambulatory  Blood pressure 137/62, pulse 66, temperature 98.1 F (36.7 C), temperature source Oral, resp. rate 18, height 6' (1.829 m), weight 156 lb 4.8 oz (70.897 kg), SpO2 96 %.  LABORATORY DATA: Lab Results  Component Value Date   WBC 3.8* 02/25/2015   HGB 14.2 02/25/2015   HCT 41.6 02/25/2015   MCV 94.7 02/25/2015   PLT 234 02/25/2015      Chemistry      Component Value Date/Time   NA 135* 02/25/2015 1054   NA 132* 09/29/2014 1115   K 3.6  02/25/2015 1054   K 3.3* 09/29/2014 1115   CL 91* 09/22/2014 1122   CO2 26 02/25/2015 1054   CO2 30 09/22/2014 1122   BUN 10.9 02/25/2015 1054   BUN 21 09/22/2014 1122   CREATININE 0.7 02/25/2015 1054   CREATININE 0.8 09/22/2014 1122      Component Value Date/Time   CALCIUM 9.3 02/25/2015 1054   CALCIUM 8.9 09/22/2014 1122   ALKPHOS 117 02/25/2015 1054   ALKPHOS 100 09/02/2014 1400   AST 24 02/25/2015 1054   AST 25 09/02/2014 1400   ALT 16 02/25/2015 1054   ALT 15 09/02/2014 1400   BILITOT 0.97 02/25/2015 1054   BILITOT 0.8 09/02/2014 1400       RADIOGRAPHIC STUDIES: No results found. ASSESSMENT AND PLAN: This is a very pleasant 74 years old white male with:  1)  stage IV non-small cell lung cancer, adenocarcinoma status post induction chemotherapy with carboplatin and Alimta and currently on maintenance treatment with single agent Alimta status post 8 cycles. He has been observation for the last few months.  He had evidence for disease progression on the left adrenal gland and the patient underwent palliative radiotherapy to the left adrenal gland under the care of Dr. Sondra Come completed on 12/16/2014. The recent CT scan of the chest, abdomen and pelvis showed improvement in the left adrenal gland metastasis. There was also mild increase in the size of the spiculated left upper lobe nodule as well as questionable new small nodules in the right lung. Overall he tolerated his first 2 cycles of immunotherapy with Nivolumab relatively well. Patient was discussed with and also seen by Dr. [name]. He will proceed with cycle #3 today as scheduled. He was given a letter stating that he was medically stable but advised that he utilize his cane or rolling-seated walker to aid with gait stability.  He'll follow-up in 3 weeks prior to cycle #4 to allow for his cruise and additional vacation time in [place].   2) atrial fibrillation: He'll continue his current medication with Cardizem, digoxin  and metoprolol in addition to Eliquis. He'll also continue his routine followup visit with his cardiologist Dr. [name].  He was advised to call immediately if he has any concerning symptoms in the interval. The patient voices understanding of current disease status and treatment options and is in agreement with the current care plan.  All questions were answered. The patient knows to call the clinic with any problems, questions or  concerns. We can certainly see the patient much sooner if necessary.  Carlton Adam, PA-C 02/25/2015  ADDENDUM: Hematology/Oncology Attending: I had a face to face encounter with the patient. I recommended his care plan. This is a very pleasant 74 years old white male with metastatic non-small cell lung cancer, adenocarcinoma who is currently undergoing second line therapy with Nivolumab status post 2 cycles and tolerated his treatment fairly well. The patient has plans for a cruise and vacation in [place] for the next few weeks. We will proceed with cycle #3 today as a scheduled. We will delay the next cycle by 1 week until he returns back home.  He was advised to call immediately if he has any concerning symptoms in the interval.  Disclaimer: This note was dictated with voice recognition software. Similar sounding words can inadvertently be transcribed and may be missed upon review. Eilleen Kempf., MD 02/28/2015

## 2015-02-25 NOTE — Patient Instructions (Signed)
[facility] Discharge Instructions for Patients Receiving Chemotherapy  Today you received the following chemotherapy agents: Nivolumab.  To help prevent nausea and vomiting after your treatment, we encourage you to take your nausea medication:  As directed   If you develop nausea and vomiting that is not controlled by your nausea medication, call the clinic.   BELOW ARE SYMPTOMS THAT SHOULD BE REPORTED IMMEDIATELY:  *FEVER GREATER THAN 100.5 F  *CHILLS WITH OR WITHOUT FEVER  NAUSEA AND VOMITING THAT IS NOT CONTROLLED WITH YOUR NAUSEA MEDICATION  *UNUSUAL SHORTNESS OF BREATH  *UNUSUAL BRUISING OR BLEEDING  TENDERNESS IN MOUTH AND THROAT WITH OR WITHOUT PRESENCE OF ULCERS  *URINARY PROBLEMS  *BOWEL PROBLEMS  UNUSUAL RASH Items with * indicate a potential emergency and should be followed up as soon as possible.  Feel free to call the clinic you have any questions or concerns. The clinic phone number is [phone].  Please show the [facility] at check-in to the Emergency Department and triage nurse.

## 2015-02-26 ENCOUNTER — Telehealth: Payer: Self-pay | Admitting: Nutrition

## 2015-02-26 NOTE — Telephone Encounter (Signed)
Contacted patient for clarification on request for oral nutrition supplements.  Left message.

## 2015-02-27 NOTE — Patient Instructions (Signed)
Follow up in 3 weeks, prior to your next scheduled cycle of immunotherapy

## 2015-03-12 ENCOUNTER — Other Ambulatory Visit: Payer: Self-pay | Admitting: *Deleted

## 2015-03-12 DIAGNOSIS — C3431 Malignant neoplasm of lower lobe, right bronchus or lung: Secondary | ICD-10-CM

## 2015-03-16 ENCOUNTER — Telehealth: Payer: Self-pay | Admitting: Internal Medicine

## 2015-03-16 ENCOUNTER — Telehealth: Payer: Self-pay | Admitting: *Deleted

## 2015-03-16 NOTE — Telephone Encounter (Signed)
PT. WOULD BE BACK HOME ON 03/23/15 OR 03/24/15. PT.'S NEXT APPOINTMENTS FOR LAB, MD VISIT, AND INFUSION ARE 04/01/15. THIS NOTE ROUTED TO DR.MOHAMED AND DIANE BELL,RN.

## 2015-03-16 NOTE — Telephone Encounter (Signed)
nailsa called to cx pt appt ...Marland KitchenMarland KitchenMarland Kitchenpt going to stay in [place] another week...Marland Kitchenper neilsa MD aware

## 2015-03-16 NOTE — Telephone Encounter (Signed)
PER DR.MOHAMED- CANCEL APPOINTMENTS FOR 03/18/15. NOTIFIED PT.'S WIFE TO KEEP PT.'S APPOINTMENTS ON 04/01/15. NOTIFIED ANNA HURD IN RESEARCH CONCERNING THE CHANGE IN PT.'S APPOINTMENTS. SHE WILL NOTIFIED NIKKI ELDRETH,RN. NOTIFIED SHAMEEKA DANIELS IN SCHEDULING.

## 2015-03-16 NOTE — Telephone Encounter (Signed)
Ok but not perfect.

## 2015-03-18 ENCOUNTER — Ambulatory Visit: Payer: Medicare Other | Admitting: Internal Medicine

## 2015-03-18 ENCOUNTER — Ambulatory Visit: Payer: Medicare Other

## 2015-03-18 ENCOUNTER — Other Ambulatory Visit: Payer: BLUE CROSS/BLUE SHIELD

## 2015-03-26 ENCOUNTER — Other Ambulatory Visit: Payer: Self-pay | Admitting: *Deleted

## 2015-04-01 ENCOUNTER — Other Ambulatory Visit (HOSPITAL_BASED_OUTPATIENT_CLINIC_OR_DEPARTMENT_OTHER): Payer: Medicare Other

## 2015-04-01 ENCOUNTER — Telehealth: Payer: Self-pay | Admitting: Internal Medicine

## 2015-04-01 ENCOUNTER — Encounter: Payer: Self-pay | Admitting: *Deleted

## 2015-04-01 ENCOUNTER — Ambulatory Visit (HOSPITAL_BASED_OUTPATIENT_CLINIC_OR_DEPARTMENT_OTHER): Payer: Medicare Other | Admitting: Physician Assistant

## 2015-04-01 ENCOUNTER — Encounter: Payer: Self-pay | Admitting: Physician Assistant

## 2015-04-01 ENCOUNTER — Ambulatory Visit (HOSPITAL_BASED_OUTPATIENT_CLINIC_OR_DEPARTMENT_OTHER): Payer: Medicare Other

## 2015-04-01 VITALS — BP 127/58 | HR 57 | Temp 97.8°F | Resp 18 | Ht 72.0 in | Wt 158.5 lb

## 2015-04-01 DIAGNOSIS — Z79899 Other long term (current) drug therapy: Secondary | ICD-10-CM

## 2015-04-01 DIAGNOSIS — C3431 Malignant neoplasm of lower lobe, right bronchus or lung: Secondary | ICD-10-CM | POA: Diagnosis not present

## 2015-04-01 DIAGNOSIS — R5382 Chronic fatigue, unspecified: Secondary | ICD-10-CM

## 2015-04-01 DIAGNOSIS — C3492 Malignant neoplasm of unspecified part of left bronchus or lung: Secondary | ICD-10-CM | POA: Diagnosis present

## 2015-04-01 DIAGNOSIS — Z5112 Encounter for antineoplastic immunotherapy: Secondary | ICD-10-CM | POA: Diagnosis present

## 2015-04-01 DIAGNOSIS — C7970 Secondary malignant neoplasm of unspecified adrenal gland: Secondary | ICD-10-CM

## 2015-04-01 DIAGNOSIS — R530 Neoplastic (malignant) related fatigue: Secondary | ICD-10-CM | POA: Diagnosis not present

## 2015-04-01 LAB — COMPREHENSIVE METABOLIC PANEL (CC13)
ALBUMIN: 3.8 g/dL (ref 3.5–5.0)
ALT: 26 U/L (ref 0–55)
ANION GAP: 11 meq/L (ref 3–11)
AST: 26 U/L (ref 5–34)
Alkaline Phosphatase: 103 U/L (ref 40–150)
BUN: 10.5 mg/dL (ref 7.0–26.0)
CO2: 23 meq/L (ref 22–29)
Calcium: 9 mg/dL (ref 8.4–10.4)
Chloride: 99 mEq/L (ref 98–109)
Creatinine: 0.7 mg/dL (ref 0.7–1.3)
GLUCOSE: 111 mg/dL (ref 70–140)
POTASSIUM: 3.7 meq/L (ref 3.5–5.1)
Sodium: 133 mEq/L — ABNORMAL LOW (ref 136–145)
Total Bilirubin: 0.63 mg/dL (ref 0.20–1.20)
Total Protein: 6 g/dL — ABNORMAL LOW (ref 6.4–8.3)

## 2015-04-01 LAB — CBC WITH DIFFERENTIAL/PLATELET
BASO%: 0.7 % (ref 0.0–2.0)
Basophils Absolute: 0 10*3/uL (ref 0.0–0.1)
EOS ABS: 0.2 10*3/uL (ref 0.0–0.5)
EOS%: 4.4 % (ref 0.0–7.0)
HEMATOCRIT: 41.2 % (ref 38.4–49.9)
HEMOGLOBIN: 13.9 g/dL (ref 13.0–17.1)
LYMPH#: 0.7 10*3/uL — AB (ref 0.9–3.3)
LYMPH%: 20.3 % (ref 14.0–49.0)
MCH: 32 pg (ref 27.2–33.4)
MCHC: 33.7 g/dL (ref 32.0–36.0)
MCV: 95 fL (ref 79.3–98.0)
MONO#: 0.4 10*3/uL (ref 0.1–0.9)
MONO%: 12.2 % (ref 0.0–14.0)
NEUT#: 2.2 10*3/uL (ref 1.5–6.5)
NEUT%: 62.4 % (ref 39.0–75.0)
Platelets: 212 10*3/uL (ref 140–400)
RBC: 4.33 10*6/uL (ref 4.20–5.82)
RDW: 14.7 % — AB (ref 11.0–14.6)
WBC: 3.5 10*3/uL — ABNORMAL LOW (ref 4.0–10.3)

## 2015-04-01 LAB — TSH CHCC: TSH: 1.069 m[IU]/L (ref 0.320–4.118)

## 2015-04-01 MED ORDER — SODIUM CHLORIDE 0.9 % IV SOLN
3.0000 mg/kg | Freq: Once | INTRAVENOUS | Status: AC
  Administered 2015-04-01: 220 mg via INTRAVENOUS
  Filled 2015-04-01: qty 22

## 2015-04-01 MED ORDER — SODIUM CHLORIDE 0.9 % IV SOLN
Freq: Once | INTRAVENOUS | Status: AC
  Administered 2015-04-01: 12:00:00 via INTRAVENOUS

## 2015-04-01 NOTE — Progress Notes (Addendum)
[facility] [phone]   Fax:([phone]) [place], [place] [address]  DIAGNOSIS: Stage IV (T1 A., N0, M1 B.) non-small cell lung cancer, adenocarcinoma with negative EGFR mutation and negative ALK gene translocation diagnosed in October of 2014.   Molecular profile: Negative for RET, ALK, BRAF, MET, EGFR.  Positive for ERBB2 A775T, OAC1Y606T, KRAS G13E, MAP2K1 D67N (see full report).   PRIOR THERAPY:  1) Systemic chemotherapy with carboplatin for AUC of 5 and Alimta 500 mg/M2 every 3 weeks, status post 6 cycles with stable disease. First dose on 10/23/2013. 2) Maintenance systemic chemotherapy with single agent Alimta 500 mg/M2 every 3 weeks. Status post 8 cycles.   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 Sondra Come completed on 12/16/2014.  CURRENT THERAPY: Immunotherapy with Nivolumab 3 MG/KG every 2 weeks. First cycle 01/28/2015. Status post 3 cycles  CHEMOTHERAPY INTENT: Palliative  CURRENT # OF CHEMOTHERAPY CYCLES: 4 CURRENT ANTIEMETICS: Zofran, Compazine and dexamethasone  CURRENT SMOKING STATUS: Former smoker  ORAL CHEMOTHERAPY AND CONSENT: None  CURRENT BISPHOSPHONATES USE: None  PAIN MANAGEMENT: 0/10  NARCOTICS INDUCED CONSTIPATION: None  LIVING WILL AND CODE STATUS: ?   INTERVAL HISTORY: Anthony Curry 74 y.o. male returns to the clinic today for followup visit accompanied by his wife. He tolerated his first 3 cycles of immunotherapy with Nivolumab relatively well. He continues to report some unsteadiness/instability on his feet and is using a cane to help with ambulation. He thinks the dizziness and unsteadiness may be related to his treatment with Eliquis. He is scheduled to see his cardiologist in follow-up on 04/29/2015 but will call the cardiologist office sooner to report his symptoms. He was family enjoyed their cruise  and time in [place].  He denied any issues with shortness of breath, any episodes of diarrhea or skin rash.  He denied having any significant fever or chills, no nausea or vomiting. He presents to proceed with cycle #4 of immunotherapy with Nivolumab.  MEDICAL HISTORY: Past Medical History  Diagnosis Date  . Hypertension   . GERD (gastroesophageal reflux disease)   . Alcohol abuse   . 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 Low sodium   . Cancer     small cell/lung  . Persistent atrial fibrillation 04/11/14    chads2vasc score is 2  . Hypotension   . Atrial fibrillation with RVR   . HCAP (healthcare-associated pneumonia)   . Adrenal hemorrhage   . BPH (benign prostatic hyperplasia)   . Hyponatremia   . Anemia of chronic disease   . Fall   . Alcohol intoxication   . Pulmonary nodules   . Adrenal mass   . Atrial flutter by electrocardiogram   . Neoplastic malignant related fatigue   . 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 Alcoholism in remission   . Radiation 12/02/14-12/16/14    Left adrenal gland 30 Gy in 10 fractions    ALLERGIES:  has No Known Allergies.  MEDICATIONS:  Current Outpatient Prescriptions  Medication Sig Dispense Refill  . ALPRAZolam (XANAX) 0.25 MG tablet Take 0.25 mg by mouth 3 (three) times daily as needed for anxiety or sleep.    [name] apixaban (ELIQUIS) 5 MG TABS tablet Take 1 tablet (5 mg total) by mouth 2 (two) times daily. 180 tablet 0  . Ascorbic Acid (VITAMIN C PO) Take 1,000 mg by mouth daily.     [name]  B Complex-C (SUPER B COMPLEX PO) Take 1 each by mouth daily.     . Biotin 7500 MCG TABS Take 1 tablet by mouth daily.     . calcium carbonate (OS-CAL) 600 MG TABS Take 600 mg by mouth daily with breakfast.    . cephALEXin (KEFLEX) 500 MG capsule Take 1 capsule (500 mg total) by mouth 4 (four) times daily. 28 capsule 7  . demeclocycline (DECLOMYCIN) 150 MG tablet Take 150 mg by mouth 2 (two) times daily.    [name] dexamethasone (DECADRON) 4 MG  tablet Take 4 mg by mouth See admin instructions. Take 1 tablet twice a day the day before, day of, and day after chemotherapy every three weeks.    . digoxin (LANOXIN) 0.125 MG tablet Take 125 mcg by mouth daily.  1  . diltiazem (CARDIZEM CD) 180 MG 24 hr capsule Take 1 capsule (180 mg total) by mouth daily at 12 noon. 90 capsule 3  . HYDROcodone-acetaminophen (NORCO/VICODIN) 5-325 MG per tablet Take 1-2 tablets by mouth every 4 (four) hours as needed (pain). 30 tablet 0  . KLOR-CON M20 20 MEQ tablet     . Lactobacillus (ACIDOPHILUS) CAPS capsule Take 1 capsule by mouth daily.    [name] MAGNESIUM PO Take 400 mg by mouth daily.     . metoprolol tartrate (LOPRESSOR) 25 MG tablet Take 1 tablet (25 mg total) by mouth 2 (two) times daily. 180 tablet 1  . mirtazapine (REMERON) 15 MG tablet Take 1 tablet (15 mg total) by mouth at bedtime. 30 tablet 0  . Multiple Vitamin (MULTIVITAMIN WITH MINERALS) TABS Take 1 tablet by mouth daily.    . Nutritional Supplements (ENSURE COMPLETE PO) Take 1 Can by mouth 2 (two) times daily.    . Omega-3 Fatty Acids (FISH OIL PO) Take 1 tablet by mouth daily.    [name] omeprazole (PRILOSEC) 20 MG capsule Take 20 mg by mouth daily as needed (heartburn).     . tamsulosin (FLOMAX) 0.4 MG CAPS capsule Take 0.4 mg by mouth daily.     [name] thiamine (VITAMIN B-1) 100 MG tablet Take 1 tablet (100 mg total) by mouth daily. 30 tablet 6  . vitamin E 400 UNIT capsule Take 400 Units by mouth daily.     No current facility-administered medications for this visit.    SURGICAL HISTORY:  Past Surgical History  Procedure Laterality Date  . Hemorrhoid surgery    . Ankle arthrodesis  1995    rt fx-hardware in  . Tonsillectomy    . Colonoscopy    . Excision / curettage bone cyst finger  2005    rt thumb  . Total shoulder arthroplasty  02/03/2012    Procedure: TOTAL SHOULDER ARTHROPLASTY;  Surgeon: [name], MD;  Location: [facility];  Service: Orthopedics;  Laterality: Left;   . Cardioversion N/A 09/29/2014    Procedure: CARDIOVERSION;  Surgeon: Josue Hector, MD;  Location: [facility];  Service: Cardiovascular;  Laterality: N/A;    REVIEW OF SYSTEMS:  Constitutional: positive for fatigue Eyes: negative Ears, nose, mouth, throat, and face: negative Respiratory: negative Cardiovascular: negative Gastrointestinal: negative Genitourinary:negative Integument/breast: negative Hematologic/lymphatic: negative Musculoskeletal:negative Neurological: positive for dizziness and gait problems Behavioral/Psych: negative Endocrine: negative Allergic/Immunologic: negative   PHYSICAL EXAMINATION: General appearance: alert, cooperative, fatigued and no distress Head: Normocephalic, without obvious abnormality, atraumatic Neck: no adenopathy, no JVD, supple, symmetrical, trachea midline and thyroid not enlarged, symmetric, no tenderness/mass/nodules Lymph nodes: Cervical, supraclavicular, and axillary nodes normal. Resp:  clear to auscultation bilaterally Back: symmetric, no curvature. ROM normal. No CVA tenderness. Cardio: regular rate and rhythm, S1, S2 normal, no murmur, click, rub or gallop GI: soft, non-tender; bowel sounds normal; no masses,  no organomegaly Extremities: extremities normal, atraumatic, no cyanosis or edema Neurologic: Alert and oriented X 3, normal strength and tone. Normal symmetric reflexes. Normal coordination and gait  ECOG PERFORMANCE STATUS: 1 - Symptomatic but completely ambulatory  Blood pressure 127/58, pulse 57, temperature 97.8 F (36.6 C), temperature source Oral, resp. rate 18, height 6' (1.829 m), weight 158 lb 8 oz (71.895 kg), SpO2 100 %.  LABORATORY DATA: Lab Results  Component Value Date   WBC 3.5* 04/01/2015   HGB 13.9 04/01/2015   HCT 41.2 04/01/2015   MCV 95.0 04/01/2015   PLT 212 04/01/2015      Chemistry      Component Value Date/Time   NA 133* 04/01/2015 1005   NA 132* 09/29/2014 1115   K 3.7 04/01/2015  1005   K 3.3* 09/29/2014 1115   CL 91* 09/22/2014 1122   CO2 23 04/01/2015 1005   CO2 30 09/22/2014 1122   BUN 10.5 04/01/2015 1005   BUN 21 09/22/2014 1122   CREATININE 0.7 04/01/2015 1005   CREATININE 0.8 09/22/2014 1122      Component Value Date/Time   CALCIUM 9.0 04/01/2015 1005   CALCIUM 8.9 09/22/2014 1122   ALKPHOS 103 04/01/2015 1005   ALKPHOS 100 09/02/2014 1400   AST 26 04/01/2015 1005   AST 25 09/02/2014 1400   ALT 26 04/01/2015 1005   ALT 15 09/02/2014 1400   BILITOT 0.63 04/01/2015 1005   BILITOT 0.8 09/02/2014 1400       RADIOGRAPHIC STUDIES: No results found. ASSESSMENT AND PLAN: This is a very pleasant 74 years old white male with:  1)  stage IV non-small cell lung cancer, adenocarcinoma status post induction chemotherapy with carboplatin and Alimta and currently on maintenance treatment with single agent Alimta status post 8 cycles. He has been observation for the last few months.  He had evidence for disease progression on the left adrenal gland and the patient underwent palliative radiotherapy to the left adrenal gland under the care of Dr. Sondra Come completed on 12/16/2014. The last CT scan of the chest, abdomen and pelvis showed improvement in the left adrenal gland metastasis. There was also mild increase in the size of the spiculated left upper lobe nodule as well as questionable new small nodules in the right lung. Overall he tolerated his first 3 cycles of immunotherapy with Nivolumab relatively well. Patient was discussed with and also seen by Dr. [name]. He will proceed with cycle #4 today as scheduled. He will follow up in 2 weeks with a restaging CT scan of the chest, abdomen and pelvis with contrast to re-evaluate his disease, prior to the start of cycle #5.  2) atrial fibrillation: He'll continue his current medication with Cardizem, digoxin and metoprolol in addition to Eliquis. He'll also continue his routine followup visit with his cardiologist Dr.  [name] as scheduled on 04/29/2015 or sooner if needed.  He was advised to call immediately if he has any concerning symptoms in the interval. The patient voices understanding of current disease status and treatment options and is in agreement with the current care plan.  All questions were answered. The patient knows to call the clinic with any problems, questions or concerns. We can certainly see the patient much sooner if necessary.  Carlton Adam, PA-C 04/01/2015  ADDENDUM:  Hematology/Oncology Attending:  I had a face to face encounter with the patient. I recommended his care plan. This is a very pleasant 74 years old white male with a stage IV non-small cell lung cancer. He is currently undergoing second line treatment with immunotherapy with Nivolumab status post 3 cycles. Cycle #4 was delayed because the patient was on vacation on a cruise as well as spent a few days in [place].  He will proceed with cycle #4 today as scheduled. The patient would come back for follow-up visit in 2 weeks for reevaluation after repeating CT scan of the chest, abdomen and pelvis for restaging of his disease. He was advised to call immediately if he has any concerning symptoms in the interval.  Disclaimer: This note was dictated with voice recognition software. Similar sounding words can inadvertently be transcribed and may be missed upon review. Eilleen Kempf., MD 04/04/2015

## 2015-04-01 NOTE — Patient Instructions (Signed)
[facility] Discharge Instructions for Patients Receiving Chemotherapy  Today you received the following chemotherapy agents: Opdivo (Nivolumab)  To help prevent nausea and vomiting after your treatment, we encourage you to take your nausea medication as directed.    If you develop nausea and vomiting that is not controlled by your nausea medication, call the clinic.   BELOW ARE SYMPTOMS THAT SHOULD BE REPORTED IMMEDIATELY:  *FEVER GREATER THAN 100.5 F  *CHILLS WITH OR WITHOUT FEVER  NAUSEA AND VOMITING THAT IS NOT CONTROLLED WITH YOUR NAUSEA MEDICATION  *UNUSUAL SHORTNESS OF BREATH  *UNUSUAL BRUISING OR BLEEDING  TENDERNESS IN MOUTH AND THROAT WITH OR WITHOUT PRESENCE OF ULCERS  *URINARY PROBLEMS  *BOWEL PROBLEMS  UNUSUAL RASH Items with * indicate a potential emergency and should be followed up as soon as possible.  Feel free to call the clinic you have any questions or concerns. The clinic phone number is [phone].  Please show the [facility] at check-in to the Emergency Department and triage nurse.

## 2015-04-01 NOTE — Telephone Encounter (Signed)
Pt confirmed labs/ov per 04/26 POF, gave pt AVS and Calendar..... KJ, gave pt barium

## 2015-04-01 NOTE — Telephone Encounter (Signed)
lvm for pt regarding to May appt.Marland KitchenMarland KitchenMarland KitchenMarland Kitchenpt will get sched from chemo

## 2015-04-02 ENCOUNTER — Telehealth: Payer: Self-pay | Admitting: Internal Medicine

## 2015-04-02 NOTE — Telephone Encounter (Signed)
New Message  Pt wife calling to make earlier appt- per oncologist- pt needs to be seen ASAP. Pt was offered all PA/NP appts including [facility]- pt wife refused and requested to speak w. Rn.  Please call back and discuss.

## 2015-04-02 NOTE — Telephone Encounter (Signed)
Labs from 4/27 are reviewed and are not suggestive of anemia or prerenal azotemia.  Would recommend follow-up with primary care regarding postural dizziness.  Encourage oral hydration.   Could follow-up with Roderic Palau NP when she returns from [facility] regarding afib.

## 2015-04-02 NOTE — Telephone Encounter (Signed)
Spoke with his wife and says he has started falling after taking the Eliquis and has been having dizziness with standing.  She is wanting to know if any of his medications could be causing this or if there is anything he can decrease.  Says he doesn't know or feel when his heart is out of rhythm.  He saw the oncologist yesterday and was told that none of the medications he is receiving would cause this  So she is reaching out to [place].  I let her know I would forward to Dr [name] but it would be Monday before I got back to her.  This has been going on for months and she was okay with the call back on Monday

## 2015-04-03 NOTE — Patient Instructions (Signed)
Follow up in 2 weeks with a restaging CT scan of your chest, abdomen and pelvis to re-evaluate your disease. Contact your cardiologist's office regarding your complaints of dizziness and unsteadiness.

## 2015-04-06 NOTE — Telephone Encounter (Signed)
Spoke with wife and let her know Dr Jackalyn Lombard recommendations.  Will need to reschedule his 5/25 appointment as he is getting his treatment that day

## 2015-04-10 ENCOUNTER — Ambulatory Visit (HOSPITAL_COMMUNITY)
Admission: RE | Admit: 2015-04-10 | Discharge: 2015-04-10 | Disposition: A | Discharge status: Home / Self Care | Payer: Medicare Other | Source: Ambulatory Visit | Attending: Physician Assistant | Admitting: Physician Assistant

## 2015-04-10 ENCOUNTER — Encounter (HOSPITAL_COMMUNITY): Payer: Self-pay

## 2015-04-10 DIAGNOSIS — K8020 Calculus of gallbladder without cholecystitis without obstruction: Secondary | ICD-10-CM | POA: Diagnosis not present

## 2015-04-10 DIAGNOSIS — C3490 Malignant neoplasm of unspecified part of unspecified bronchus or lung: Secondary | ICD-10-CM | POA: Diagnosis not present

## 2015-04-10 DIAGNOSIS — I700 Atherosclerosis of aorta: Secondary | ICD-10-CM | POA: Diagnosis not present

## 2015-04-10 DIAGNOSIS — C3431 Malignant neoplasm of lower lobe, right bronchus or lung: Secondary | ICD-10-CM

## 2015-04-10 DIAGNOSIS — C7972 Secondary malignant neoplasm of left adrenal gland: Secondary | ICD-10-CM | POA: Diagnosis not present

## 2015-04-10 DIAGNOSIS — C3492 Malignant neoplasm of unspecified part of left bronchus or lung: Secondary | ICD-10-CM | POA: Diagnosis not present

## 2015-04-10 DIAGNOSIS — C7971 Secondary malignant neoplasm of right adrenal gland: Secondary | ICD-10-CM | POA: Diagnosis not present

## 2015-04-10 MED ORDER — IOHEXOL 300 MG/ML  SOLN
100.0000 mL | Freq: Once | INTRAMUSCULAR | Status: AC | PRN
  Administered 2015-04-10: 100 mL via INTRAVENOUS

## 2015-04-14 ENCOUNTER — Encounter: Payer: Self-pay | Admitting: Nutrition

## 2015-04-14 NOTE — Progress Notes (Signed)
Provide third and final case of Ensure Plus for patient to pick up on Wednesday, May 11.

## 2015-04-15 ENCOUNTER — Ambulatory Visit (HOSPITAL_BASED_OUTPATIENT_CLINIC_OR_DEPARTMENT_OTHER): Payer: Medicare Other

## 2015-04-15 ENCOUNTER — Other Ambulatory Visit (HOSPITAL_BASED_OUTPATIENT_CLINIC_OR_DEPARTMENT_OTHER): Payer: Medicare Other

## 2015-04-15 ENCOUNTER — Ambulatory Visit (HOSPITAL_BASED_OUTPATIENT_CLINIC_OR_DEPARTMENT_OTHER): Payer: Medicare Other | Admitting: Physician Assistant

## 2015-04-15 ENCOUNTER — Telehealth: Payer: Self-pay | Admitting: Physician Assistant

## 2015-04-15 ENCOUNTER — Ambulatory Visit: Payer: Medicare Other | Admitting: Internal Medicine

## 2015-04-15 ENCOUNTER — Encounter: Payer: Self-pay | Admitting: Physician Assistant

## 2015-04-15 ENCOUNTER — Telehealth: Payer: Self-pay | Admitting: *Deleted

## 2015-04-15 VITALS — BP 139/61 | HR 62 | Temp 97.6°F | Resp 18 | Ht 72.0 in | Wt 160.3 lb

## 2015-04-15 DIAGNOSIS — Z5112 Encounter for antineoplastic immunotherapy: Secondary | ICD-10-CM | POA: Diagnosis not present

## 2015-04-15 DIAGNOSIS — C3431 Malignant neoplasm of lower lobe, right bronchus or lung: Secondary | ICD-10-CM | POA: Diagnosis not present

## 2015-04-15 DIAGNOSIS — C3490 Malignant neoplasm of unspecified part of unspecified bronchus or lung: Secondary | ICD-10-CM

## 2015-04-15 DIAGNOSIS — I4891 Unspecified atrial fibrillation: Secondary | ICD-10-CM

## 2015-04-15 DIAGNOSIS — M7989 Other specified soft tissue disorders: Secondary | ICD-10-CM

## 2015-04-15 DIAGNOSIS — C3492 Malignant neoplasm of unspecified part of left bronchus or lung: Secondary | ICD-10-CM

## 2015-04-15 DIAGNOSIS — C7972 Secondary malignant neoplasm of left adrenal gland: Secondary | ICD-10-CM | POA: Diagnosis not present

## 2015-04-15 LAB — CBC WITH DIFFERENTIAL/PLATELET
BASO%: 0.8 % (ref 0.0–2.0)
BASOS ABS: 0 10*3/uL (ref 0.0–0.1)
EOS%: 4.6 % (ref 0.0–7.0)
Eosinophils Absolute: 0.2 10*3/uL (ref 0.0–0.5)
HEMATOCRIT: 40.9 % (ref 38.4–49.9)
HEMOGLOBIN: 14.1 g/dL (ref 13.0–17.1)
LYMPH%: 24.5 % (ref 14.0–49.0)
MCH: 32.5 pg (ref 27.2–33.4)
MCHC: 34.4 g/dL (ref 32.0–36.0)
MCV: 94.6 fL (ref 79.3–98.0)
MONO#: 0.4 10*3/uL (ref 0.1–0.9)
MONO%: 11.2 % (ref 0.0–14.0)
NEUT#: 2.3 10*3/uL (ref 1.5–6.5)
NEUT%: 58.9 % (ref 39.0–75.0)
Platelets: 246 10*3/uL (ref 140–400)
RBC: 4.33 10*6/uL (ref 4.20–5.82)
RDW: 13.7 % (ref 11.0–14.6)
WBC: 3.9 10*3/uL — ABNORMAL LOW (ref 4.0–10.3)
lymph#: 1 10*3/uL (ref 0.9–3.3)

## 2015-04-15 LAB — COMPREHENSIVE METABOLIC PANEL (CC13)
ALT: 19 U/L (ref 0–55)
ANION GAP: 10 meq/L (ref 3–11)
AST: 24 U/L (ref 5–34)
Albumin: 3.6 g/dL (ref 3.5–5.0)
Alkaline Phosphatase: 101 U/L (ref 40–150)
BILIRUBIN TOTAL: 0.55 mg/dL (ref 0.20–1.20)
BUN: 15.4 mg/dL (ref 7.0–26.0)
CALCIUM: 8.9 mg/dL (ref 8.4–10.4)
CHLORIDE: 100 meq/L (ref 98–109)
CO2: 24 meq/L (ref 22–29)
CREATININE: 0.7 mg/dL (ref 0.7–1.3)
EGFR: >90 mL/min/{1.73_m2} (ref >90)
Glucose: 105 mg/dl (ref 70–140)
Potassium: 4.2 mEq/L (ref 3.5–5.1)
Sodium: 134 mEq/L — ABNORMAL LOW (ref 136–145)
Total Protein: 6.1 g/dL — ABNORMAL LOW (ref 6.4–8.3)

## 2015-04-15 MED ORDER — SODIUM CHLORIDE 0.9 % IV SOLN
Freq: Once | INTRAVENOUS | Status: AC
  Administered 2015-04-15: 16:00:00 via INTRAVENOUS

## 2015-04-15 MED ORDER — SODIUM CHLORIDE 0.9 % IV SOLN
3.0000 mg/kg | Freq: Once | INTRAVENOUS | Status: AC
  Administered 2015-04-15: 220 mg via INTRAVENOUS
  Filled 2015-04-15: qty 22

## 2015-04-15 MED ORDER — HYDROCODONE-ACETAMINOPHEN 5-325 MG PO TABS: 1.0000 | ORAL_TABLET | ORAL | Status: DC | PRN

## 2015-04-15 NOTE — Telephone Encounter (Signed)
Pt confirmed labs/ov per 05/11 POF, gave pt AVS and Calendar.... KJ, sent msg to r/s 05/25 to later in the day and schedule other dates

## 2015-04-15 NOTE — Telephone Encounter (Signed)
Per staff message and POF I have scheduled appts. Advised scheduler of appts. JMW  

## 2015-04-15 NOTE — Progress Notes (Addendum)
[place] [phone]   Fax:([phone]) [place], [place] [address]  DIAGNOSIS: Stage IV (T1 A., N0, M1 B.) non-small cell lung cancer, adenocarcinoma with negative EGFR mutation and negative ALK gene translocation diagnosed in October of 2014.   Molecular profile: Negative for RET, ALK, BRAF, MET, EGFR.  Positive for ERBB2 A775T, [id number], KRAS G13E, MAP2K1 D67N (see full report).   PRIOR THERAPY:  1) Systemic chemotherapy with carboplatin for AUC of 5 and Alimta 500 mg/M2 every 3 weeks, status post 6 cycles with stable disease. First dose on 10/23/2013. 2) Maintenance systemic chemotherapy with single agent Alimta 500 mg/M2 every 3 weeks. Status post 8 cycles.   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 Sondra Come completed on 12/16/2014.  CURRENT THERAPY: Immunotherapy with Nivolumab 3 MG/KG every 2 weeks. First cycle 01/28/2015. Status post 4 cycles  CHEMOTHERAPY INTENT: Palliative  CURRENT # OF CHEMOTHERAPY CYCLES: 5 CURRENT ANTIEMETICS: Zofran, Compazine and dexamethasone  CURRENT SMOKING STATUS: Former smoker  ORAL CHEMOTHERAPY AND CONSENT: None  CURRENT BISPHOSPHONATES USE: None  PAIN MANAGEMENT: 0/10  NARCOTICS INDUCED CONSTIPATION: None  LIVING WILL AND CODE STATUS: ?   INTERVAL HISTORY: Anthony Curry 74 y.o. male returns to the clinic today for followup visit accompanied by his wife. He is tolerating his immunotherapy with Nivolumab relatively well. He continues to report some unsteadiness/instability on his feet and is using a cane to help with ambulation. He denied any issues with shortness of breath, any episodes of diarrhea or skin rash.  He denied having any significant fever or chills, no nausea or vomiting. He presents to proceed with cycle #5 of immunotherapy with Nivolumab. He requests a refill prescription for his  hydrocodone tablets.He recently had a restaging CT scan of his chest, abdomen and pelvis to re-evaluate his disease and presents to discuss the results.  MEDICAL HISTORY: Past Medical History  Diagnosis Date  . Hypertension   . GERD (gastroesophageal reflux disease)   . Alcohol abuse   . 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 Low sodium   . Persistent atrial fibrillation 04/11/14    chads2vasc score is 2  . Hypotension   . Atrial fibrillation with RVR   . HCAP (healthcare-associated pneumonia)   . Adrenal hemorrhage   . BPH (benign prostatic hyperplasia)   . Hyponatremia   . Anemia of chronic disease   . Fall   . Alcohol intoxication   . Pulmonary nodules   . Adrenal mass   . Atrial flutter by electrocardiogram   . 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 Alcoholism in remission   . Radiation 12/02/14-12/16/14    Left adrenal gland 30 Gy in 10 fractions  . Cancer     small cell/lung  . Neoplastic malignant related fatigue     ALLERGIES:  has No Known Allergies.  MEDICATIONS:  Current Outpatient Prescriptions  Medication Sig Dispense Refill  . ALPRAZolam (XANAX) 0.25 MG tablet Take 0.25 mg by mouth 3 (three) times daily as needed for anxiety or sleep.    [name] apixaban (ELIQUIS) 5 MG TABS tablet Take 1 tablet (5 mg total) by mouth 2 (two) times daily. 180 tablet 0  . Ascorbic Acid (VITAMIN C PO) Take 1,000 mg by mouth daily.     . B Complex-C (SUPER B COMPLEX PO) Take 1 each by mouth daily.     [name]  Biotin 7500 MCG TABS Take 1 tablet by mouth daily.     . calcium carbonate (OS-CAL) 600 MG TABS Take 600 mg by mouth daily with breakfast.    . cephALEXin (KEFLEX) 500 MG capsule Take 1 capsule (500 mg total) by mouth 4 (four) times daily. 28 capsule 7  . demeclocycline (DECLOMYCIN) 150 MG tablet Take 150 mg by mouth 2 (two) times daily.    [name] dexamethasone (DECADRON) 4 MG tablet Take 4 mg by mouth See admin instructions. Take 1 tablet twice a day the day before, day  of, and day after chemotherapy every three weeks.    . digoxin (LANOXIN) 0.125 MG tablet Take 125 mcg by mouth daily.  1  . diltiazem (CARDIZEM CD) 180 MG 24 hr capsule Take 1 capsule (180 mg total) by mouth daily at 12 noon. 90 capsule 3  . furosemide (LASIX) 40 MG tablet Take 40 mg by mouth.    [name] HYDROcodone-acetaminophen (NORCO/VICODIN) 5-325 MG per tablet Take 1-2 tablets by mouth every 4 (four) hours as needed (pain). 30 tablet 0  . KLOR-CON M20 20 MEQ tablet     . Lactobacillus (ACIDOPHILUS) CAPS capsule Take 1 capsule by mouth daily.    [name] MAGNESIUM PO Take 400 mg by mouth daily.     . metoprolol tartrate (LOPRESSOR) 25 MG tablet Take 1 tablet (25 mg total) by mouth 2 (two) times daily. 180 tablet 1  . mirtazapine (REMERON) 15 MG tablet Take 1 tablet (15 mg total) by mouth at bedtime. 30 tablet 0  . Multiple Vitamin (MULTIVITAMIN WITH MINERALS) TABS Take 1 tablet by mouth daily.    . Nutritional Supplements (ENSURE COMPLETE PO) Take 1 Can by mouth 2 (two) times daily.    . Omega-3 Fatty Acids (FISH OIL PO) Take 1 tablet by mouth daily.    [name] omeprazole (PRILOSEC) 20 MG capsule Take 20 mg by mouth daily as needed (heartburn).     . tamsulosin (FLOMAX) 0.4 MG CAPS capsule Take 0.4 mg by mouth daily.     [name] thiamine (VITAMIN B-1) 100 MG tablet Take 1 tablet (100 mg total) by mouth daily. 30 tablet 6  . vitamin E 400 UNIT capsule Take 400 Units by mouth daily.     No current facility-administered medications for this visit.   Facility-Administered Medications Ordered in Other Visits  Medication Dose Route Frequency Provider Last Rate Last Dose  . nivolumab (OPDIVO) 220 mg in sodium chloride 0.9 % 100 mL chemo infusion  3 mg/kg (Treatment Plan Actual) Intravenous Once [name], MD 122 mL/hr at 04/15/15 1558 220 mg at 04/15/15 1558    SURGICAL HISTORY:  Past Surgical History  Procedure Laterality Date  . Hemorrhoid surgery    . Ankle arthrodesis  1995    rt fx-hardware in  .  Tonsillectomy    . Colonoscopy    . Excision / curettage bone cyst finger  2005    rt thumb  . Total shoulder arthroplasty  02/03/2012    Procedure: TOTAL SHOULDER ARTHROPLASTY;  Surgeon: [name], MD;  Location: [facility];  Service: Orthopedics;  Laterality: Left;  . Cardioversion N/A 09/29/2014    Procedure: CARDIOVERSION;  Surgeon: Josue Hector, MD;  Location: [facility];  Service: Cardiovascular;  Laterality: N/A;    REVIEW OF SYSTEMS:  Constitutional: positive for fatigue Eyes: negative Ears, nose, mouth, throat, and face: negative Respiratory: negative Cardiovascular: negative Gastrointestinal: negative Genitourinary:negative Integument/breast: negative Hematologic/lymphatic: negative Musculoskeletal:negative Neurological: positive for dizziness and  gait problems Behavioral/Psych: negative Endocrine: negative Allergic/Immunologic: negative   PHYSICAL EXAMINATION: General appearance: alert, cooperative, fatigued and no distress Head: Normocephalic, without obvious abnormality, atraumatic Neck: no adenopathy, no JVD, supple, symmetrical, trachea midline and thyroid not enlarged, symmetric, no tenderness/mass/nodules Lymph nodes: Cervical, supraclavicular, and axillary nodes normal. Resp: clear to auscultation bilaterally Back: symmetric, no curvature. ROM normal. No CVA tenderness. Cardio: regular rate and rhythm, S1, S2 normal, no murmur, click, rub or gallop GI: soft, non-tender; bowel sounds normal; no masses,  no organomegaly Extremities: extremities normal, atraumatic, no cyanosis or edema Neurologic: Alert and oriented X 3, normal strength and tone. Normal symmetric reflexes. Normal coordination and gait  ECOG PERFORMANCE STATUS: 1 - Symptomatic but completely ambulatory  Blood pressure 139/61, pulse 62, temperature 97.6 F (36.4 C), temperature source Oral, resp. rate 18, height 6' (1.829 m), weight 160 lb 4.8 oz (72.712 kg), SpO2 100  %.  LABORATORY DATA: Lab Results  Component Value Date   WBC 3.9* 04/15/2015   HGB 14.1 04/15/2015   HCT 40.9 04/15/2015   MCV 94.6 04/15/2015   PLT 246 04/15/2015      Chemistry      Component Value Date/Time   NA 134* 04/15/2015 1340   NA 132* 09/29/2014 1115   K 4.2 04/15/2015 1340   K 3.3* 09/29/2014 1115   CL 91* 09/22/2014 1122   CO2 24 04/15/2015 1340   CO2 30 09/22/2014 1122   BUN 15.4 04/15/2015 1340   BUN 21 09/22/2014 1122   CREATININE 0.7 04/15/2015 1340   CREATININE 0.8 09/22/2014 1122      Component Value Date/Time   CALCIUM 8.9 04/15/2015 1340   CALCIUM 8.9 09/22/2014 1122   ALKPHOS 101 04/15/2015 1340   ALKPHOS 100 09/02/2014 1400   AST 24 04/15/2015 1340   AST 25 09/02/2014 1400   ALT 19 04/15/2015 1340   ALT 15 09/02/2014 1400   BILITOT 0.55 04/15/2015 1340   BILITOT 0.8 09/02/2014 1400       RADIOGRAPHIC STUDIES: Ct Chest W Contrast  04/10/2015   CLINICAL DATA:  Restaging non-small cell lung cancer diagnosed 10/2014, chemotherapy/XRT complete  EXAM: CT CHEST, ABDOMEN, AND PELVIS WITH CONTRAST  TECHNIQUE: Multidetector CT imaging of the chest, abdomen and pelvis was performed following the standard protocol during bolus administration of intravenous contrast.  CONTRAST:  143m OMNIPAQUE IOHEXOL 300 MG/ML  SOLN  COMPARISON:  01/12/2015  FINDINGS: CT CHEST FINDINGS  Mediastinum/Nodes: The heart is normal in size. No pericardial effusion.  Coronary atherosclerosis in the LAD and right coronary artery.  Atherosclerotic calcifications of the aortic arch.  5 mm short axis right paratracheal node and 5 mm short axis subcarinal node, within normal limits. No suspicious hilar or axillary lymphadenopathy.  Visualized thyroid is unremarkable.  Lungs/Pleura: 13 x 9 mm spiculated nodule in the medial left lung apex, previously 14 x 18 mm.  8 mm spiculated nodule in the lateral left upper lobe (series 4/image 20), previously 4 mm. Prior clustered subcentimeter nodules  in the medial right lower lobe are no longer evident.  Scattered ground-glass opacities in the bilateral upper lobes are similar (series 4/ images 23, 25, and 27).  Stents calcified granuloma in the left posterior costophrenic sulcus.  Underlying moderate centrilobular and paraseptal emphysematous changes.  No pleural effusion or pneumothorax.  Musculoskeletal: Asymmetric right gynecomastia.  Degenerative changes of the thoracic spine.  Multiple old right rib defects.  Status post left shoulder arthroplasty.  CT ABDOMEN PELVIS FINDINGS  Hepatobiliary: Stable hypervascular  lesions in the liver (series 2/ image 65 and 60) likely reflect flash filling hemangiomas. Additional scattered subcentimeter cysts.  Layering gallstones, without associated inflammatory changes. No intrahepatic or extrahepatic ductal dilatation.  Pancreas: Within normal limits.  Spleen: Tiny probable splenic hemangioma (series 2/ image 62).  Adrenals/Urinary Tract: 1.6 x 1.3 cm left adrenal nodule (series 2/ image 67), previously 2.1 x 1.3 cm.  1.3 x 1.1 cm right adrenal nodule (series 2/image 65), grossly unchanged.  Bilateral renal cortical scarring. No enhancing renal lesions. No hydronephrosis.  Trabeculated bladder.  Stomach/Bowel: Stomach is within normal limits.  No evidence of bowel obstruction.  Colonic diverticulosis, without evidence of diverticulitis.  Vascular/Lymphatic: Atherosclerotic calcifications of the abdominal aorta and branch vessels.  No suspicious abdominopelvic lymphadenopathy.  Reproductive: Prostate is unremarkable.  Other: No abdominopelvic ascites.  Tiny fat containing left inguinal hernia.  Musculoskeletal: Degenerative changes of the visualized thoracolumbar spine.  IMPRESSION: 13 mm spiculated nodule in the medial left lung apex, improved.  Additional 8 mm spiculated nodule in the lateral left upper lobe, mildly increased, indeterminate.  Stable ground-glass opacities in the bilateral upper lobes.  Small bilateral  adrenal nodules/metastases, mildly decreased on the left.  Cholelithiasis, without associated inflammatory changes.   Electronically Signed   By: Julian Hy M.D.   On: 04/10/2015 09:40   Ct Abdomen Pelvis W Contrast  04/10/2015   CLINICAL DATA:  Restaging non-small cell lung cancer diagnosed 10/2014, chemotherapy/XRT complete  EXAM: CT CHEST, ABDOMEN, AND PELVIS WITH CONTRAST  TECHNIQUE: Multidetector CT imaging of the chest, abdomen and pelvis was performed following the standard protocol during bolus administration of intravenous contrast.  CONTRAST:  122m OMNIPAQUE IOHEXOL 300 MG/ML  SOLN  COMPARISON:  01/12/2015  FINDINGS: CT CHEST FINDINGS  Mediastinum/Nodes: The heart is normal in size. No pericardial effusion.  Coronary atherosclerosis in the LAD and right coronary artery.  Atherosclerotic calcifications of the aortic arch.  5 mm short axis right paratracheal node and 5 mm short axis subcarinal node, within normal limits. No suspicious hilar or axillary lymphadenopathy.  Visualized thyroid is unremarkable.  Lungs/Pleura: 13 x 9 mm spiculated nodule in the medial left lung apex, previously 14 x 18 mm.  8 mm spiculated nodule in the lateral left upper lobe (series 4/image 20), previously 4 mm. Prior clustered subcentimeter nodules in the medial right lower lobe are no longer evident.  Scattered ground-glass opacities in the bilateral upper lobes are similar (series 4/ images 23, 25, and 27).  Stents calcified granuloma in the left posterior costophrenic sulcus.  Underlying moderate centrilobular and paraseptal emphysematous changes.  No pleural effusion or pneumothorax.  Musculoskeletal: Asymmetric right gynecomastia.  Degenerative changes of the thoracic spine.  Multiple old right rib defects.  Status post left shoulder arthroplasty.  CT ABDOMEN PELVIS FINDINGS  Hepatobiliary: Stable hypervascular lesions in the liver (series 2/ image 531and 60) likely reflect flash filling hemangiomas. Additional  scattered subcentimeter cysts.  Layering gallstones, without associated inflammatory changes. No intrahepatic or extrahepatic ductal dilatation.  Pancreas: Within normal limits.  Spleen: Tiny probable splenic hemangioma (series 2/ image 550.  Adrenals/Urinary Tract: 1.6 x 1.3 cm left adrenal nodule (series 2/ image 67), previously 2.1 x 1.3 cm.  1.3 x 1.1 cm right adrenal nodule (series 2/image 65), grossly unchanged.  Bilateral renal cortical scarring. No enhancing renal lesions. No hydronephrosis.  Trabeculated bladder.  Stomach/Bowel: Stomach is within normal limits.  No evidence of bowel obstruction.  Colonic diverticulosis, without evidence of diverticulitis.  Vascular/Lymphatic: Atherosclerotic calcifications of  the abdominal aorta and branch vessels.  No suspicious abdominopelvic lymphadenopathy.  Reproductive: Prostate is unremarkable.  Other: No abdominopelvic ascites.  Tiny fat containing left inguinal hernia.  Musculoskeletal: Degenerative changes of the visualized thoracolumbar spine.  IMPRESSION: 13 mm spiculated nodule in the medial left lung apex, improved.  Additional 8 mm spiculated nodule in the lateral left upper lobe, mildly increased, indeterminate.  Stable ground-glass opacities in the bilateral upper lobes.  Small bilateral adrenal nodules/metastases, mildly decreased on the left.  Cholelithiasis, without associated inflammatory changes.   Electronically Signed   By: Julian Hy M.D.   On: 04/10/2015 09:40   ASSESSMENT AND PLAN: This is a very pleasant 74 years old white male with:  1)  stage IV non-small cell lung cancer, adenocarcinoma status post induction chemotherapy with carboplatin and Alimta and currently on maintenance treatment with single agent Alimta status post 8 cycles. He has been observation for the last few months.  He had evidence for disease progression on the left adrenal gland and the patient underwent palliative radiotherapy to the left adrenal gland under the  care of Dr. Sondra Come completed on 12/16/2014. The recent CT scan of the chest, abdomen and pelvis showed improvement/stable disease. Overall he is tolerating  of immunotherapy with Nivolumab relatively well. Patient was discussed with and also seen by Dr. [name]. He will proceed with cycle #5 today as scheduled. He will follow up in 2 weeks prior to the start of cycle #6.  2) atrial fibrillation: He'll continue his current medication with Cardizem, digoxin and metoprolol in addition to Eliquis. He'll also continue his routine followup visit with his cardiologist Dr. [name] as scheduled on 04/29/2015 or sooner if needed.  He was given a refill prescription for his hydrocodone tablets.  He was advised to call immediately if he has any concerning symptoms in the interval. The patient voices understanding of current disease status and treatment options and is in agreement with the current care plan.  All questions were answered. The patient knows to call the clinic with any problems, questions or concerns. We can certainly see the patient much sooner if necessary.  Carlton Adam, PA-C 04/15/2015  ADDENDUM: Hematology/Oncology Attending: I had a face to face encounter with the patient. I recommended his care plan. This is a very pleasant 74 years old white male with metastatic non-small cell lung cancer, adenocarcinoma status post induction chemotherapy followed by maintenance treatment with Alimta which was discontinued secondary to entrance. The patient is currently undergoing treatment with immunotherapy with Nivolumab status post 4 cycles. He tolerated the 4 cycle of his treatment fairly well with no significant adverse effects. The recent CT scan of the chest, abdomen and pelvis showed improvement in his disease. I discussed the scan results with the patient and his wife. I recommended for him to continue his current immunotherapy with Nivolumab as a scheduled. He will receive cycle #5 today.  The patient would come back for follow-up visit in 2 weeks. He was advised to call immediately if he has any concerning symptoms in the interval.  Disclaimer: This note was dictated with voice recognition software. Similar sounding words can inadvertently be transcribed and may be missed upon review. Eilleen Kempf., MD 04/19/2015

## 2015-04-15 NOTE — Patient Instructions (Signed)
[facility] Discharge Instructions for Patients Receiving Chemotherapy  Today you received the following chemotherapy agents Opdivo.  To help prevent nausea and vomiting after your treatment, we encourage you to take your nausea medication as directed.   If you develop nausea and vomiting that is not controlled by your nausea medication, call the clinic.   BELOW ARE SYMPTOMS THAT SHOULD BE REPORTED IMMEDIATELY:  *FEVER GREATER THAN 100.5 F  *CHILLS WITH OR WITHOUT FEVER  NAUSEA AND VOMITING THAT IS NOT CONTROLLED WITH YOUR NAUSEA MEDICATION  *UNUSUAL SHORTNESS OF BREATH  *UNUSUAL BRUISING OR BLEEDING  TENDERNESS IN MOUTH AND THROAT WITH OR WITHOUT PRESENCE OF ULCERS  *URINARY PROBLEMS  *BOWEL PROBLEMS  UNUSUAL RASH Items with * indicate a potential emergency and should be followed up as soon as possible.  Feel free to call the clinic you have any questions or concerns. The clinic phone number is [phone].  Please show the CHEMO ALERT CARD at check-in to the Emergency Department and triage nurse.    

## 2015-04-19 NOTE — Patient Instructions (Signed)
Your recent CT scan showed improvement in his disease. Follow up in 2 weeks

## 2015-04-21 ENCOUNTER — Encounter: Payer: Self-pay | Admitting: *Deleted

## 2015-04-29 ENCOUNTER — Ambulatory Visit (HOSPITAL_BASED_OUTPATIENT_CLINIC_OR_DEPARTMENT_OTHER): Payer: Medicare Other | Admitting: Physician Assistant

## 2015-04-29 ENCOUNTER — Telehealth: Payer: Self-pay | Admitting: Internal Medicine

## 2015-04-29 ENCOUNTER — Encounter: Payer: Self-pay | Admitting: Physician Assistant

## 2015-04-29 ENCOUNTER — Telehealth: Payer: Self-pay | Admitting: Physician Assistant

## 2015-04-29 ENCOUNTER — Ambulatory Visit: Payer: Medicare Other

## 2015-04-29 ENCOUNTER — Other Ambulatory Visit (HOSPITAL_BASED_OUTPATIENT_CLINIC_OR_DEPARTMENT_OTHER): Payer: Medicare Other

## 2015-04-29 ENCOUNTER — Ambulatory Visit (HOSPITAL_BASED_OUTPATIENT_CLINIC_OR_DEPARTMENT_OTHER): Payer: Medicare Other

## 2015-04-29 ENCOUNTER — Ambulatory Visit: Payer: Medicare Other | Admitting: Internal Medicine

## 2015-04-29 ENCOUNTER — Other Ambulatory Visit: Payer: Medicare Other

## 2015-04-29 VITALS — BP 141/59 | HR 58 | Temp 98.0°F | Resp 18 | Ht 72.0 in | Wt 158.7 lb

## 2015-04-29 DIAGNOSIS — C7972 Secondary malignant neoplasm of left adrenal gland: Secondary | ICD-10-CM | POA: Diagnosis not present

## 2015-04-29 DIAGNOSIS — C3431 Malignant neoplasm of lower lobe, right bronchus or lung: Secondary | ICD-10-CM

## 2015-04-29 DIAGNOSIS — C3410 Malignant neoplasm of upper lobe, unspecified bronchus or lung: Secondary | ICD-10-CM

## 2015-04-29 DIAGNOSIS — R5382 Chronic fatigue, unspecified: Secondary | ICD-10-CM

## 2015-04-29 DIAGNOSIS — C3492 Malignant neoplasm of unspecified part of left bronchus or lung: Secondary | ICD-10-CM

## 2015-04-29 DIAGNOSIS — Z79899 Other long term (current) drug therapy: Secondary | ICD-10-CM

## 2015-04-29 DIAGNOSIS — Z5112 Encounter for antineoplastic immunotherapy: Secondary | ICD-10-CM

## 2015-04-29 DIAGNOSIS — R42 Dizziness and giddiness: Secondary | ICD-10-CM | POA: Diagnosis not present

## 2015-04-29 LAB — COMPREHENSIVE METABOLIC PANEL (CC13)
ALBUMIN: 3.9 g/dL (ref 3.5–5.0)
ALT: 19 U/L (ref 0–55)
ANION GAP: 11 meq/L (ref 3–11)
AST: 24 U/L (ref 5–34)
Alkaline Phosphatase: 107 U/L (ref 40–150)
BILIRUBIN TOTAL: 0.66 mg/dL (ref 0.20–1.20)
BUN: 11.1 mg/dL (ref 7.0–26.0)
CHLORIDE: 99 meq/L (ref 98–109)
CO2: 24 mEq/L (ref 22–29)
Calcium: 9.1 mg/dL (ref 8.4–10.4)
Creatinine: 0.8 mg/dL (ref 0.7–1.3)
EGFR: 87 mL/min/{1.73_m2} — ABNORMAL LOW (ref >90)
Glucose: 94 mg/dl (ref 70–140)
Potassium: 3.9 mEq/L (ref 3.5–5.1)
Sodium: 134 mEq/L — ABNORMAL LOW (ref 136–145)
Total Protein: 6.5 g/dL (ref 6.4–8.3)

## 2015-04-29 LAB — CBC WITH DIFFERENTIAL/PLATELET
BASO%: 0.5 % (ref 0.0–2.0)
BASOS ABS: 0 10*3/uL (ref 0.0–0.1)
EOS ABS: 0.2 10*3/uL (ref 0.0–0.5)
EOS%: 5.7 % (ref 0.0–7.0)
HEMATOCRIT: 43.3 % (ref 38.4–49.9)
HGB: 14.8 g/dL (ref 13.0–17.1)
LYMPH#: 1.1 10*3/uL (ref 0.9–3.3)
LYMPH%: 25 % (ref 14.0–49.0)
MCH: 32.3 pg (ref 27.2–33.4)
MCHC: 34.2 g/dL (ref 32.0–36.0)
MCV: 94.6 fL (ref 79.3–98.0)
MONO#: 0.6 10*3/uL (ref 0.1–0.9)
MONO%: 14.9 % — ABNORMAL HIGH (ref 0.0–14.0)
NEUT#: 2.3 10*3/uL (ref 1.5–6.5)
NEUT%: 53.9 % (ref 39.0–75.0)
PLATELETS: 252 10*3/uL (ref 140–400)
RBC: 4.58 10*6/uL (ref 4.20–5.82)
RDW: 13.9 % (ref 11.0–14.6)
WBC: 4.3 10*3/uL (ref 4.0–10.3)

## 2015-04-29 LAB — TSH CHCC: TSH: 2.105 m(IU)/L (ref 0.320–4.118)

## 2015-04-29 MED ORDER — SODIUM CHLORIDE 0.9 % IV SOLN
Freq: Once | INTRAVENOUS | Status: AC
  Administered 2015-04-29: 15:00:00 via INTRAVENOUS

## 2015-04-29 MED ORDER — SODIUM CHLORIDE 0.9 % IV SOLN
3.0000 mg/kg | Freq: Once | INTRAVENOUS | Status: AC
  Administered 2015-04-29: 220 mg via INTRAVENOUS
  Filled 2015-04-29: qty 22

## 2015-04-29 NOTE — Progress Notes (Addendum)
[facility] [phone]   Fax:([phone]) [place], [place] [address]  DIAGNOSIS: Stage IV (T1 A., N0, M1 B.) non-small cell lung cancer, adenocarcinoma with negative EGFR mutation and negative ALK gene translocation diagnosed in October of 2014.   Molecular profile: Negative for RET, ALK, BRAF, MET, EGFR.  Positive for ERBB2 A775T, [id number], KRAS G13E, MAP2K1 D67N (see full report).   PRIOR THERAPY:  1) Systemic chemotherapy with carboplatin for AUC of 5 and Alimta 500 mg/M2 every 3 weeks, status post 6 cycles with stable disease. First dose on 10/23/2013. 2) Maintenance systemic chemotherapy with single agent Alimta 500 mg/M2 every 3 weeks. Status post 8 cycles.   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 Sondra Come completed on 12/16/2014.  CURRENT THERAPY: Immunotherapy with Nivolumab 3 MG/KG every 2 weeks. First cycle 01/28/2015. Status post 5 cycles  CHEMOTHERAPY INTENT: Palliative  CURRENT # OF CHEMOTHERAPY CYCLES: 6 CURRENT ANTIEMETICS: Zofran, Compazine and dexamethasone  CURRENT SMOKING STATUS: Former smoker  ORAL CHEMOTHERAPY AND CONSENT: None  CURRENT BISPHOSPHONATES USE: None  PAIN MANAGEMENT: 0/10  NARCOTICS INDUCED CONSTIPATION: None  LIVING WILL AND CODE STATUS: ?   INTERVAL HISTORY: Anthony Curry 74 y.o. male returns to the clinic today for followup visit accompanied by his wife. He is tolerating his immunotherapy with Nivolumab relatively well. He continues to report some unsteadiness/instability on his feet and is using a cane to help with ambulation. He also complains of continued dizziness.He denied any issues with shortness of breath, any episodes of diarrhea or skin rash.  He denied having any significant fever or chills, no nausea or vomiting. He presents to proceed with cycle #6 of immunotherapy with  Nivolumab.  MEDICAL HISTORY: Past Medical History  Diagnosis Date  . Hypertension   . GERD (gastroesophageal reflux disease)   . Alcohol abuse   . 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 Low sodium   . Persistent atrial fibrillation 04/11/14    chads2vasc score is 2  . Hypotension   . Atrial fibrillation with RVR   . HCAP (healthcare-associated pneumonia)   . Adrenal hemorrhage   . BPH (benign prostatic hyperplasia)   . Hyponatremia   . Anemia of chronic disease   . Fall   . Alcohol intoxication   . Pulmonary nodules   . Adrenal mass   . Atrial flutter by electrocardiogram   . 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 Alcoholism in remission   . Radiation 12/02/14-12/16/14    Left adrenal gland 30 Gy in 10 fractions  . Cancer     small cell/lung  . Neoplastic malignant related fatigue     ALLERGIES:  has No Known Allergies.  MEDICATIONS:  Current Outpatient Prescriptions  Medication Sig Dispense Refill  . ALPRAZolam (XANAX) 0.25 MG tablet Take 0.25 mg by mouth 3 (three) times daily as needed for anxiety or sleep.    [name] apixaban (ELIQUIS) 5 MG TABS tablet Take 1 tablet (5 mg total) by mouth 2 (two) times daily. 180 tablet 0  . Ascorbic Acid (VITAMIN C PO) Take 1,000 mg by mouth daily.     . B Complex-C (SUPER B COMPLEX PO) Take 1 each by mouth daily.     . Biotin 7500 MCG TABS Take 1 tablet by mouth daily.     . calcium carbonate (OS-CAL) 600 MG TABS Take 600 mg by  mouth daily with breakfast.    . cephALEXin (KEFLEX) 500 MG capsule Take 1 capsule (500 mg total) by mouth 4 (four) times daily. 28 capsule 7  . demeclocycline (DECLOMYCIN) 150 MG tablet Take 150 mg by mouth 2 (two) times daily.    [name] dexamethasone (DECADRON) 4 MG tablet Take 4 mg by mouth See admin instructions. Take 1 tablet twice a day the day before, day of, and day after chemotherapy every three weeks.    . digoxin (LANOXIN) 0.125 MG tablet Take 125 mcg by mouth daily.  1  . diltiazem  (CARDIZEM CD) 180 MG 24 hr capsule Take 1 capsule (180 mg total) by mouth daily at 12 noon. 90 capsule 3  . furosemide (LASIX) 40 MG tablet Take 40 mg by mouth.    [name] HYDROcodone-acetaminophen (NORCO/VICODIN) 5-325 MG per tablet Take 1-2 tablets by mouth every 4 (four) hours as needed (pain). 30 tablet 0  . KLOR-CON M20 20 MEQ tablet     . Lactobacillus (ACIDOPHILUS) CAPS capsule Take 1 capsule by mouth daily.    [name] MAGNESIUM PO Take 400 mg by mouth daily.     . metoprolol tartrate (LOPRESSOR) 25 MG tablet Take 1 tablet (25 mg total) by mouth 2 (two) times daily. 180 tablet 1  . mirtazapine (REMERON) 15 MG tablet Take 1 tablet (15 mg total) by mouth at bedtime. 30 tablet 0  . Multiple Vitamin (MULTIVITAMIN WITH MINERALS) TABS Take 1 tablet by mouth daily.    . Nutritional Supplements (ENSURE COMPLETE PO) Take 1 Can by mouth 2 (two) times daily.    . Omega-3 Fatty Acids (FISH OIL PO) Take 1 tablet by mouth daily.    [name] omeprazole (PRILOSEC) 20 MG capsule Take 20 mg by mouth daily as needed (heartburn).     . tamsulosin (FLOMAX) 0.4 MG CAPS capsule Take 0.4 mg by mouth daily.     [name] thiamine (VITAMIN B-1) 100 MG tablet Take 1 tablet (100 mg total) by mouth daily. 30 tablet 6  . vitamin E 400 UNIT capsule Take 400 Units by mouth daily.     No current facility-administered medications for this visit.   Facility-Administered Medications Ordered in Other Visits  Medication Dose Route Frequency Provider Last Rate Last Dose  . nivolumab (OPDIVO) 220 mg in sodium chloride 0.9 % 100 mL chemo infusion  3 mg/kg (Treatment Plan Actual) Intravenous Once [name], MD 122 mL/hr at 04/29/15 1535 220 mg at 04/29/15 1535    SURGICAL HISTORY:  Past Surgical History  Procedure Laterality Date  . Hemorrhoid surgery    . Ankle arthrodesis  1995    rt fx-hardware in  . Tonsillectomy    . Colonoscopy    . Excision / curettage bone cyst finger  2005    rt thumb  . Total shoulder arthroplasty  02/03/2012     Procedure: TOTAL SHOULDER ARTHROPLASTY;  Surgeon: [name], MD;  Location: [facility];  Service: Orthopedics;  Laterality: Left;  . Cardioversion N/A 09/29/2014    Procedure: CARDIOVERSION;  Surgeon: Josue Hector, MD;  Location: [facility] ENDOSCOPY;  Service: Cardiovascular;  Laterality: N/A;    REVIEW OF SYSTEMS:  Constitutional: positive for fatigue Eyes: negative Ears, nose, mouth, throat, and face: negative Respiratory: negative Cardiovascular: negative Gastrointestinal: negative Genitourinary:negative Integument/breast: negative Hematologic/lymphatic: negative Musculoskeletal:negative Neurological: positive for dizziness and gait problems Behavioral/Psych: negative Endocrine: negative Allergic/Immunologic: negative   PHYSICAL EXAMINATION: General appearance: alert, cooperative, fatigued and no distress Head: Normocephalic, without obvious abnormality,  atraumatic Neck: no adenopathy, no JVD, supple, symmetrical, trachea midline and thyroid not enlarged, symmetric, no tenderness/mass/nodules Lymph nodes: Cervical, supraclavicular, and axillary nodes normal. Resp: clear to auscultation bilaterally Back: symmetric, no curvature. ROM normal. No CVA tenderness. Cardio: regular rate and rhythm, S1, S2 normal, no murmur, click, rub or gallop GI: soft, non-tender; bowel sounds normal; no masses,  no organomegaly Extremities: extremities normal, atraumatic, no cyanosis or edema Neurologic: Alert and oriented X 3, normal strength and tone. Normal symmetric reflexes. Normal coordination and gait  ECOG PERFORMANCE STATUS: 1 - Symptomatic but completely ambulatory  Blood pressure 141/59, pulse 58, temperature 98 F (36.7 C), temperature source Oral, resp. rate 18, height 6' (1.829 m), weight 158 lb 11.2 oz (71.986 kg), SpO2 99 %.  LABORATORY DATA: Lab Results  Component Value Date   WBC 4.3 04/29/2015   HGB 14.8 04/29/2015   HCT 43.3 04/29/2015   MCV 94.6 04/29/2015    PLT 252 04/29/2015      Chemistry      Component Value Date/Time   NA 134* 04/29/2015 1331   NA 132* 09/29/2014 1115   K 3.9 04/29/2015 1331   K 3.3* 09/29/2014 1115   CL 91* 09/22/2014 1122   CO2 24 04/29/2015 1331   CO2 30 09/22/2014 1122   BUN 11.1 04/29/2015 1331   BUN 21 09/22/2014 1122   CREATININE 0.8 04/29/2015 1331   CREATININE 0.8 09/22/2014 1122      Component Value Date/Time   CALCIUM 9.1 04/29/2015 1331   CALCIUM 8.9 09/22/2014 1122   ALKPHOS 107 04/29/2015 1331   ALKPHOS 100 09/02/2014 1400   AST 24 04/29/2015 1331   AST 25 09/02/2014 1400   ALT 19 04/29/2015 1331   ALT 15 09/02/2014 1400   BILITOT 0.66 04/29/2015 1331   BILITOT 0.8 09/02/2014 1400       RADIOGRAPHIC STUDIES: Ct Chest W Contrast  04/10/2015   CLINICAL DATA:  Restaging non-small cell lung cancer diagnosed 10/2014, chemotherapy/XRT complete  EXAM: CT CHEST, ABDOMEN, AND PELVIS WITH CONTRAST  TECHNIQUE: Multidetector CT imaging of the chest, abdomen and pelvis was performed following the standard protocol during bolus administration of intravenous contrast.  CONTRAST:  176m OMNIPAQUE IOHEXOL 300 MG/ML  SOLN  COMPARISON:  01/12/2015  FINDINGS: CT CHEST FINDINGS  Mediastinum/Nodes: The heart is normal in size. No pericardial effusion.  Coronary atherosclerosis in the LAD and right coronary artery.  Atherosclerotic calcifications of the aortic arch.  5 mm short axis right paratracheal node and 5 mm short axis subcarinal node, within normal limits. No suspicious hilar or axillary lymphadenopathy.  Visualized thyroid is unremarkable.  Lungs/Pleura: 13 x 9 mm spiculated nodule in the medial left lung apex, previously 14 x 18 mm.  8 mm spiculated nodule in the lateral left upper lobe (series 4/image 20), previously 4 mm. Prior clustered subcentimeter nodules in the medial right lower lobe are no longer evident.  Scattered ground-glass opacities in the bilateral upper lobes are similar (series 4/ images 23,  25, and 27).  Stents calcified granuloma in the left posterior costophrenic sulcus.  Underlying moderate centrilobular and paraseptal emphysematous changes.  No pleural effusion or pneumothorax.  Musculoskeletal: Asymmetric right gynecomastia.  Degenerative changes of the thoracic spine.  Multiple old right rib defects.  Status post left shoulder arthroplasty.  CT ABDOMEN PELVIS FINDINGS  Hepatobiliary: Stable hypervascular lesions in the liver (series 2/ image 584and 60) likely reflect flash filling hemangiomas. Additional scattered subcentimeter cysts.  Layering gallstones, without associated inflammatory  changes. No intrahepatic or extrahepatic ductal dilatation.  Pancreas: Within normal limits.  Spleen: Tiny probable splenic hemangioma (series 2/ image 53).  Adrenals/Urinary Tract: 1.6 x 1.3 cm left adrenal nodule (series 2/ image 67), previously 2.1 x 1.3 cm.  1.3 x 1.1 cm right adrenal nodule (series 2/image 65), grossly unchanged.  Bilateral renal cortical scarring. No enhancing renal lesions. No hydronephrosis.  Trabeculated bladder.  Stomach/Bowel: Stomach is within normal limits.  No evidence of bowel obstruction.  Colonic diverticulosis, without evidence of diverticulitis.  Vascular/Lymphatic: Atherosclerotic calcifications of the abdominal aorta and branch vessels.  No suspicious abdominopelvic lymphadenopathy.  Reproductive: Prostate is unremarkable.  Other: No abdominopelvic ascites.  Tiny fat containing left inguinal hernia.  Musculoskeletal: Degenerative changes of the visualized thoracolumbar spine.  IMPRESSION: 13 mm spiculated nodule in the medial left lung apex, improved.  Additional 8 mm spiculated nodule in the lateral left upper lobe, mildly increased, indeterminate.  Stable ground-glass opacities in the bilateral upper lobes.  Small bilateral adrenal nodules/metastases, mildly decreased on the left.  Cholelithiasis, without associated inflammatory changes.   Electronically Signed   By:  Julian Hy M.D.   On: 04/10/2015 09:40   Ct Abdomen Pelvis W Contrast  04/10/2015   CLINICAL DATA:  Restaging non-small cell lung cancer diagnosed 10/2014, chemotherapy/XRT complete  EXAM: CT CHEST, ABDOMEN, AND PELVIS WITH CONTRAST  TECHNIQUE: Multidetector CT imaging of the chest, abdomen and pelvis was performed following the standard protocol during bolus administration of intravenous contrast.  CONTRAST:  153m OMNIPAQUE IOHEXOL 300 MG/ML  SOLN  COMPARISON:  01/12/2015  FINDINGS: CT CHEST FINDINGS  Mediastinum/Nodes: The heart is normal in size. No pericardial effusion.  Coronary atherosclerosis in the LAD and right coronary artery.  Atherosclerotic calcifications of the aortic arch.  5 mm short axis right paratracheal node and 5 mm short axis subcarinal node, within normal limits. No suspicious hilar or axillary lymphadenopathy.  Visualized thyroid is unremarkable.  Lungs/Pleura: 13 x 9 mm spiculated nodule in the medial left lung apex, previously 14 x 18 mm.  8 mm spiculated nodule in the lateral left upper lobe (series 4/image 20), previously 4 mm. Prior clustered subcentimeter nodules in the medial right lower lobe are no longer evident.  Scattered ground-glass opacities in the bilateral upper lobes are similar (series 4/ images 23, 25, and 27).  Stents calcified granuloma in the left posterior costophrenic sulcus.  Underlying moderate centrilobular and paraseptal emphysematous changes.  No pleural effusion or pneumothorax.  Musculoskeletal: Asymmetric right gynecomastia.  Degenerative changes of the thoracic spine.  Multiple old right rib defects.  Status post left shoulder arthroplasty.  CT ABDOMEN PELVIS FINDINGS  Hepatobiliary: Stable hypervascular lesions in the liver (series 2/ image 57and 60) likely reflect flash filling hemangiomas. Additional scattered subcentimeter cysts.  Layering gallstones, without associated inflammatory changes. No intrahepatic or extrahepatic ductal dilatation.   Pancreas: Within normal limits.  Spleen: Tiny probable splenic hemangioma (series 2/ image 543.  Adrenals/Urinary Tract: 1.6 x 1.3 cm left adrenal nodule (series 2/ image 67), previously 2.1 x 1.3 cm.  1.3 x 1.1 cm right adrenal nodule (series 2/image 65), grossly unchanged.  Bilateral renal cortical scarring. No enhancing renal lesions. No hydronephrosis.  Trabeculated bladder.  Stomach/Bowel: Stomach is within normal limits.  No evidence of bowel obstruction.  Colonic diverticulosis, without evidence of diverticulitis.  Vascular/Lymphatic: Atherosclerotic calcifications of the abdominal aorta and branch vessels.  No suspicious abdominopelvic lymphadenopathy.  Reproductive: Prostate is unremarkable.  Other: No abdominopelvic ascites.  Tiny fat containing  left inguinal hernia.  Musculoskeletal: Degenerative changes of the visualized thoracolumbar spine.  IMPRESSION: 13 mm spiculated nodule in the medial left lung apex, improved.  Additional 8 mm spiculated nodule in the lateral left upper lobe, mildly increased, indeterminate.  Stable ground-glass opacities in the bilateral upper lobes.  Small bilateral adrenal nodules/metastases, mildly decreased on the left.  Cholelithiasis, without associated inflammatory changes.   Electronically Signed   By: Julian Hy M.D.   On: 04/10/2015 09:40   ASSESSMENT AND PLAN: This is a very pleasant 74 years old white male with:  1)  stage IV non-small cell lung cancer, adenocarcinoma status post induction chemotherapy with carboplatin and Alimta and currently on maintenance treatment with single agent Alimta status post 8 cycles. He has been observation for the last few months.  He had evidence for disease progression on the left adrenal gland and the patient underwent palliative radiotherapy to the left adrenal gland under the care of Dr. Sondra Come completed on 12/16/2014. The recent CT scan of the chest, abdomen and pelvis showed improvement/stable disease. Overall he  is tolerating  of immunotherapy with Nivolumab relatively well. Patient was discussed with and also seen by Dr. [name]. He will proceed with cycle #6 today as scheduled. He will follow up in 2 weeks prior to the start of cycle #7.  2) atrial fibrillation: He'll continue his current medication with Cardizem, digoxin and metoprolol in addition to Eliquis. He'll also continue his routine followup visit with his cardiologist Dr. [name] as scheduled on 04/29/2015.   3) dizziness: The persistent dizziness is concerning. We will obtain a MRI of the brain with and without contrast to evaluate for brain metastasis vs hypophysitis.  He was given a refill prescription for his hydrocodone tablets.  He was advised to call immediately if he has any concerning symptoms in the interval. The patient voices understanding of current disease status and treatment options and is in agreement with the current care plan.  All questions were answered. The patient knows to call the clinic with any problems, questions or concerns. We can certainly see the patient much sooner if necessary.  Carlton Adam, PA-C 04/29/2015  ADDENDUM: Hematology/Oncology Attending: I had a face to face encounter with the patient. I recommended his care plan.  This is a very pleasant 74 years old white male with a stage IV non-small cell lung cancer, adenocarcinoma who is currently undergoing second line treatment with immunotherapy with Nivolumab status post 5 cycles and tolerating his treatment fairly well. The patient denied having any specific complaints except for persistent dizziness started few weeks ago. I recommended for the patient to proceed with his immunotherapy today with cycle #6. For the dizziness, will consider the patient for MRI of the brain to rule out brain metastases or hypophysitis. He will come back for follow-up visit in 2 weeks for reevaluation with the start of cycle #7 of his treatment. The patient was advised  to call immediately if he has any concerning symptoms in the interval.  Disclaimer: This note was dictated with voice recognition software. Similar sounding words can inadvertently be transcribed and may be missed upon review. Eilleen Kempf., MD 05/04/2015

## 2015-04-29 NOTE — Telephone Encounter (Signed)
s.w. pt wife and confirmed on todays appt....Marland Kitchenok and aware

## 2015-04-29 NOTE — Telephone Encounter (Signed)
per pof to continue as planned-gave pt copy of sch

## 2015-04-29 NOTE — Patient Instructions (Signed)
[facility] Discharge Instructions for Patients Receiving Chemotherapy  Today you received the following chemotherapy agents: Nivolumab  To help prevent nausea and vomiting after your treatment, we encourage you to take your nausea medication as prescribed by your physician.    If you develop nausea and vomiting that is not controlled by your nausea medication, call the clinic.   BELOW ARE SYMPTOMS THAT SHOULD BE REPORTED IMMEDIATELY:  *FEVER GREATER THAN 100.5 F  *CHILLS WITH OR WITHOUT FEVER  NAUSEA AND VOMITING THAT IS NOT CONTROLLED WITH YOUR NAUSEA MEDICATION  *UNUSUAL SHORTNESS OF BREATH  *UNUSUAL BRUISING OR BLEEDING  TENDERNESS IN MOUTH AND THROAT WITH OR WITHOUT PRESENCE OF ULCERS  *URINARY PROBLEMS  *BOWEL PROBLEMS  UNUSUAL RASH Items with * indicate a potential emergency and should be followed up as soon as possible.  Feel free to call the clinic you have any questions or concerns. The clinic phone number is [phone].  Please show the CHEMO ALERT CARD at check-in to the Emergency Department and triage nurse.   

## 2015-04-30 ENCOUNTER — Telehealth: Payer: Self-pay | Admitting: Internal Medicine

## 2015-04-30 NOTE — Telephone Encounter (Signed)
Schedule treatment per care plan/POF. Advised scheduler.

## 2015-05-04 NOTE — Patient Instructions (Signed)
You are being scheduled for a MRI of the brain to further evaluate your complaints of dizziness Follow up in 2 weeks, prior to the start of your next scheduled cycle of immunotherapy

## 2015-05-11 ENCOUNTER — Ambulatory Visit (INDEPENDENT_AMBULATORY_CARE_PROVIDER_SITE_OTHER): Payer: Medicare Other | Admitting: Internal Medicine

## 2015-05-11 ENCOUNTER — Encounter: Payer: Self-pay | Admitting: Internal Medicine

## 2015-05-11 VITALS — BP 138/78 | HR 66 | Ht 72.0 in | Wt 158.2 lb

## 2015-05-11 DIAGNOSIS — I119 Hypertensive heart disease without heart failure: Secondary | ICD-10-CM | POA: Diagnosis not present

## 2015-05-11 DIAGNOSIS — R42 Dizziness and giddiness: Secondary | ICD-10-CM

## 2015-05-11 DIAGNOSIS — I4891 Unspecified atrial fibrillation: Secondary | ICD-10-CM

## 2015-05-11 MED ORDER — FUROSEMIDE 40 MG PO TABS: 40.0000 mg | ORAL_TABLET | Freq: Every day | ORAL | Status: DC

## 2015-05-11 MED ORDER — METOPROLOL TARTRATE 25 MG PO TABS: 12.5000 mg | ORAL_TABLET | Freq: Two times a day (BID) | ORAL | Status: DC

## 2015-05-11 NOTE — Patient Instructions (Signed)
Medication Instructions:  Your physician has recommended you make the following change in your medication:  1) Stop Digoxin 2) Decrease Furosemide to 40 mg daily 3) Decrease Metoprolol to 12.5 mg twice daily  Labwork: None ordered  Testing/Procedures: None ordered  Follow-Up: Your physician recommends that you schedule a follow-up appointment in: 2 months with Roderic Palau, NP   Any Other Special Instructions Will Be Listed Below (If Applicable).

## 2015-05-11 NOTE — Progress Notes (Signed)
Electrophysiology Office Note   Date:  05/11/2015   ID:  Anthony Curry, DOB 06-Oct-1941, MRN [id number]  PCP:  [name], MD  Primary Electrophysiologist: Thompson Grayer, MD    Chief Complaint  Patient presents with  . Persistent AFIB     History of Present Illness: Anthony Curry is a 74 y.o. male who presents today for electrophysiology evaluation.   He appears to be maintaining sinus rhythm.  His edema has resolved.  He has postural dizziness and is unsteady at times.  Today, he denies symptoms of palpitations, chest pain, shortness of breath, orthopnea, PND, lower extremity edema, claudication,  presyncope, syncope, bleeding, or neurologic sequela. The patient is tolerating medications without difficulties and is otherwise without complaint today.    Past Medical History  Diagnosis Date  . Hypertension   . GERD (gastroesophageal reflux disease)   . Alcohol abuse   . 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 Low sodium   . Persistent atrial fibrillation 04/11/14    chads2vasc score is 2  . Hypotension   . Atrial fibrillation with RVR   . HCAP (healthcare-associated pneumonia)   . Adrenal hemorrhage   . BPH (benign prostatic hyperplasia)   . Hyponatremia   . Anemia of chronic disease   . Fall   . Alcohol intoxication   . Pulmonary nodules   . Adrenal mass   . Atrial flutter by electrocardiogram   . 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 Alcoholism in remission   . Radiation 12/02/14-12/16/14    Left adrenal gland 30 Gy in 10 fractions  . Cancer     small cell/lung  . Neoplastic malignant related fatigue    Past Surgical History  Procedure Laterality Date  . Hemorrhoid surgery    . Ankle arthrodesis  1995    rt fx-hardware in  . Tonsillectomy    . Colonoscopy    . Excision / curettage bone cyst finger  2005    rt thumb  . Total shoulder arthroplasty  02/03/2012    Procedure: TOTAL SHOULDER ARTHROPLASTY;  Surgeon: [name], MD;   Location: [facility];  Service: Orthopedics;  Laterality: Left;  . Cardioversion N/A 09/29/2014    Procedure: CARDIOVERSION;  Surgeon: Josue Hector, MD;  Location: [facility] ENDOSCOPY;  Service: Cardiovascular;  Laterality: N/A;     Current Outpatient Prescriptions  Medication Sig Dispense Refill  . ALPRAZolam (XANAX) 0.25 MG tablet Take 0.25 mg by mouth 3 (three) times daily as needed for anxiety or sleep.    [name] apixaban (ELIQUIS) 5 MG TABS tablet Take 1 tablet (5 mg total) by mouth 2 (two) times daily. 180 tablet 0  . Ascorbic Acid (VITAMIN C PO) Take 1,000 mg by mouth daily.     . B Complex-C (SUPER B COMPLEX PO) Take 1 each by mouth daily.     . Biotin 7500 MCG TABS Take 1 tablet by mouth daily.     . calcium carbonate (OS-CAL) 600 MG TABS Take 600 mg by mouth daily with breakfast.    . cephALEXin (KEFLEX) 500 MG capsule Take 1 capsule (500 mg total) by mouth 4 (four) times daily. 28 capsule 7  . demeclocycline (DECLOMYCIN) 150 MG tablet Take 150 mg by mouth 2 (two) times daily.    [name] dexamethasone (DECADRON) 4 MG tablet Take 4 mg by mouth See admin instructions. Take 1 tablet twice a day the day before, day of, and day after chemotherapy every three weeks.    [name]  digoxin (LANOXIN) 0.125 MG tablet Take 125 mcg by mouth daily.  1  . diltiazem (CARDIZEM CD) 180 MG 24 hr capsule Take 1 capsule (180 mg total) by mouth daily at 12 noon. 90 capsule 3  . furosemide (LASIX) 40 MG tablet Take 40 mg by mouth 2 (two) times daily.     [name] HYDROcodone-acetaminophen (NORCO/VICODIN) 5-325 MG per tablet Take 1-2 tablets by mouth every 4 (four) hours as needed (pain). 30 tablet 0  . KLOR-CON M20 20 MEQ tablet Take 20 mEq by mouth daily.     . Lactobacillus (ACIDOPHILUS) CAPS capsule Take 1 capsule by mouth daily.    [name] MAGNESIUM PO Take 400 mg by mouth daily.     . metoprolol tartrate (LOPRESSOR) 25 MG tablet Take 1 tablet (25 mg total) by mouth 2 (two) times daily. 180 tablet 1  . mirtazapine  (REMERON) 15 MG tablet Take 1 tablet (15 mg total) by mouth at bedtime. 30 tablet 0  . Multiple Vitamin (MULTIVITAMIN WITH MINERALS) TABS Take 1 tablet by mouth daily.    . Nivolumab (OPDIVO IV) Inject as directed every 2 months    . Nutritional Supplements (ENSURE COMPLETE PO) Take 1 Can by mouth 2 (two) times daily.    . Omega-3 Fatty Acids (FISH OIL PO) Take 1 tablet by mouth daily.    [name] omeprazole (PRILOSEC) 20 MG capsule Take 20 mg by mouth daily as needed (heartburn).     . tamsulosin (FLOMAX) 0.4 MG CAPS capsule Take 0.4 mg by mouth daily.     [name] thiamine (VITAMIN B-1) 100 MG tablet Take 1 tablet (100 mg total) by mouth daily. 30 tablet 6  . vitamin E 400 UNIT capsule Take 400 Units by mouth daily.     No current facility-administered medications for this visit.    Allergies:   Review of patient's allergies indicates no known allergies.   Social History:  The patient  reports that he quit smoking about 24 years ago. His smoking use included Cigarettes. He has a 60 pack-year smoking history. He has never used smokeless tobacco. He reports that he does not drink alcohol or use illicit drugs.   Family History:  The patient's  family history includes Brain cancer in his brother; Diabetes in his paternal grandmother; Heart attack in his brother and father; Hypertension in his father; Lung cancer in his mother.    ROS:  Please see the history of present illness.   All other systems are reviewed and negative.    PHYSICAL EXAM: VS:  BP 138/78 mmHg  Pulse 66  Ht 6' (1.829 m)  Wt 71.759 kg (158 lb 3.2 oz)  BMI 21.45 kg/m2 , BMI Body mass index is 21.45 kg/(m^2). GEN: Well nourished, well developed, in no acute distress HEENT: normal with dry MM Neck: no JVD, carotid bruits, or masses Cardiac: RRR; no murmurs, rubs, or gallops,no edema  Respiratory:  Abnormal BS RUL, normal work of breathing GI: soft, nontender, nondistended, + BS MS: no deformity or atrophy Skin: warm and dry  Neuro:   Strength and sensation are intact Psych: euthymic mood, full affect  EKG:  EKG is ordered today. The ekg ordered today shows sinus rhythm with PVCs   Recent Labs: 07/27/2014: Pro B Natriuretic peptide (BNP) 1683.0* 07/28/2014: Magnesium 1.3* 04/29/2015: ALT 19; BUN 11.1; Creatinine 0.8; Hemoglobin 14.8; Platelets 252; Potassium 3.9; Sodium 134*; TSH 2.105    Lipid Panel     Component Value Date/Time   CHOL  05/30/2007 0400  158        ATP III CLASSIFICATION:  <200     mg/dL   Desirable  200-239  mg/dL   Borderline High  >=240    mg/dL   High   TRIG 40 05/30/2007 0400   HDL 55 05/30/2007 0400   CHOLHDL 2.9 05/30/2007 0400   VLDL 8 05/30/2007 0400   LDLCALC  05/30/2007 0400    95        Total Cholesterol/HDL:CHD Risk Coronary Heart Disease Risk Table                     Men   Women  1/2 Average Risk   3.4   3.3     Wt Readings from Last 3 Encounters:  05/11/15 71.759 kg (158 lb 3.2 oz)  04/29/15 71.986 kg (158 lb 11.2 oz)  04/15/15 72.712 kg (160 lb 4.8 oz)      Other studies Reviewed: Additional studies/ records that were reviewed today include: echo 2015  Review of the above records today demonstrates: preserved EF   ASSESSMENT AND PLAN:  1.  Paroxysmal atrial fibrillation Now maintaining sinus rhythm Stop digoxin Decrease metoprolol to 12.'5mg'$  BID Consider stopping metoprolol upon follow-up  2. Diastolic dysfunction Improved Reduce lasix to '40mg'$  daily Consider reducing to '20mg'$  daily upon follow-up if volume is stable  3. Dizziness Appears dry today Reduce lasix, stop dig, reduce metoprolol  Follow-up with Roderic Palau NP in the AF clinic in 2 months She can then see every 3 months thereafter and I will be available as needed going forward   Signed, Thompson Grayer, MD  05/11/2015 2:35 PM     [address] [place] [id number] [phone] (office) [phone] (fax)\

## 2015-05-12 ENCOUNTER — Ambulatory Visit (HOSPITAL_COMMUNITY)
Admission: RE | Admit: 2015-05-12 | Discharge: 2015-05-12 | Disposition: A | Discharge status: Home / Self Care | Payer: Medicare Other | Source: Ambulatory Visit | Attending: Physician Assistant | Admitting: Physician Assistant

## 2015-05-12 ENCOUNTER — Telehealth: Payer: Self-pay | Admitting: Internal Medicine

## 2015-05-12 DIAGNOSIS — R42 Dizziness and giddiness: Secondary | ICD-10-CM | POA: Insufficient documentation

## 2015-05-12 DIAGNOSIS — C3490 Malignant neoplasm of unspecified part of unspecified bronchus or lung: Secondary | ICD-10-CM | POA: Diagnosis not present

## 2015-05-12 DIAGNOSIS — G319 Degenerative disease of nervous system, unspecified: Secondary | ICD-10-CM | POA: Insufficient documentation

## 2015-05-12 DIAGNOSIS — I739 Peripheral vascular disease, unspecified: Secondary | ICD-10-CM | POA: Insufficient documentation

## 2015-05-12 MED ORDER — GADOBENATE DIMEGLUMINE 529 MG/ML IV SOLN
15.0000 mL | Freq: Once | INTRAVENOUS | Status: AC | PRN
  Administered 2015-05-12: 14 mL via INTRAVENOUS

## 2015-05-12 NOTE — Telephone Encounter (Signed)
Patient was told to reduce his furosemide to 40 mg daily at his office visit yesterday. Patient is already taking 40 mg daily. Can patient go down to 20 mg daily as written in "assessment and plan" in office note as seen below?   "2. Diastolic dysfunction Improved, Reduce lasix to '40mg'$  daily, Consider reducing to '20mg'$  daily upon follow-up if volume is stable"  Will forward to Dr. [name].

## 2015-05-12 NOTE — Telephone Encounter (Signed)
New message      Pt c/o medication issue:  1. Name of Medication: furosemide  2. How are you currently taking this medication (dosage and times per day)? '40mg'$  daily 3. Are you having a reaction (difficulty breathing--STAT)? no  4. What is your medication issue?  Pt recently saw Dr [name].  Dr [name] thought pt was taking rx '40mg'$  twice a day, and told him to cut it back to '40mg'$  daily. However, pt was already taking medication '40mg'$  daily.  Should he cut the tablet in half?

## 2015-05-13 ENCOUNTER — Encounter: Payer: Self-pay | Admitting: Physician Assistant

## 2015-05-13 ENCOUNTER — Telehealth: Payer: Self-pay | Admitting: Internal Medicine

## 2015-05-13 ENCOUNTER — Ambulatory Visit (HOSPITAL_BASED_OUTPATIENT_CLINIC_OR_DEPARTMENT_OTHER): Payer: Medicare Other

## 2015-05-13 ENCOUNTER — Ambulatory Visit (HOSPITAL_BASED_OUTPATIENT_CLINIC_OR_DEPARTMENT_OTHER): Payer: Medicare Other | Admitting: Physician Assistant

## 2015-05-13 ENCOUNTER — Other Ambulatory Visit (HOSPITAL_BASED_OUTPATIENT_CLINIC_OR_DEPARTMENT_OTHER): Payer: Medicare Other

## 2015-05-13 VITALS — BP 133/64 | HR 90 | Temp 97.9°F | Resp 18 | Ht 68.0 in | Wt 157.2 lb

## 2015-05-13 DIAGNOSIS — C7972 Secondary malignant neoplasm of left adrenal gland: Secondary | ICD-10-CM

## 2015-05-13 DIAGNOSIS — I10 Essential (primary) hypertension: Secondary | ICD-10-CM

## 2015-05-13 DIAGNOSIS — C3431 Malignant neoplasm of lower lobe, right bronchus or lung: Secondary | ICD-10-CM

## 2015-05-13 DIAGNOSIS — Z5112 Encounter for antineoplastic immunotherapy: Secondary | ICD-10-CM

## 2015-05-13 DIAGNOSIS — C3410 Malignant neoplasm of upper lobe, unspecified bronchus or lung: Secondary | ICD-10-CM | POA: Diagnosis not present

## 2015-05-13 DIAGNOSIS — R42 Dizziness and giddiness: Secondary | ICD-10-CM | POA: Diagnosis not present

## 2015-05-13 DIAGNOSIS — C3492 Malignant neoplasm of unspecified part of left bronchus or lung: Secondary | ICD-10-CM

## 2015-05-13 DIAGNOSIS — I4891 Unspecified atrial fibrillation: Secondary | ICD-10-CM | POA: Diagnosis not present

## 2015-05-13 LAB — CBC WITH DIFFERENTIAL/PLATELET
BASO%: 0.9 % (ref 0.0–2.0)
BASOS ABS: 0 10*3/uL (ref 0.0–0.1)
EOS ABS: 0.3 10*3/uL (ref 0.0–0.5)
EOS%: 6.9 % (ref 0.0–7.0)
HCT: 41.9 % (ref 38.4–49.9)
HGB: 14.5 g/dL (ref 13.0–17.1)
LYMPH#: 1.1 10*3/uL (ref 0.9–3.3)
LYMPH%: 26 % (ref 14.0–49.0)
MCH: 32.1 pg (ref 27.2–33.4)
MCHC: 34.6 g/dL (ref 32.0–36.0)
MCV: 92.9 fL (ref 79.3–98.0)
MONO#: 0.5 10*3/uL (ref 0.1–0.9)
MONO%: 12.8 % (ref 0.0–14.0)
NEUT#: 2.2 10*3/uL (ref 1.5–6.5)
NEUT%: 53.4 % (ref 39.0–75.0)
PLATELETS: 227 10*3/uL (ref 140–400)
RBC: 4.5 10*6/uL (ref 4.20–5.82)
RDW: 13.5 % (ref 11.0–14.6)
WBC: 4.1 10*3/uL (ref 4.0–10.3)

## 2015-05-13 LAB — COMPREHENSIVE METABOLIC PANEL (CC13)
ALT: 16 U/L (ref 0–55)
AST: 20 U/L (ref 5–34)
Albumin: 3.7 g/dL (ref 3.5–5.0)
Alkaline Phosphatase: 97 U/L (ref 40–150)
Anion Gap: 8 mEq/L (ref 3–11)
BUN: 10.6 mg/dL (ref 7.0–26.0)
CO2: 27 mEq/L (ref 22–29)
Calcium: 9.2 mg/dL (ref 8.4–10.4)
Chloride: 100 mEq/L (ref 98–109)
Creatinine: 0.8 mg/dL (ref 0.7–1.3)
EGFR: 89 mL/min/{1.73_m2} — AB (ref >90)
Glucose: 126 mg/dl (ref 70–140)
POTASSIUM: 3.6 meq/L (ref 3.5–5.1)
SODIUM: 135 meq/L — AB (ref 136–145)
TOTAL PROTEIN: 6.1 g/dL — AB (ref 6.4–8.3)
Total Bilirubin: 0.78 mg/dL (ref 0.20–1.20)

## 2015-05-13 MED ORDER — SODIUM CHLORIDE 0.9 % IV SOLN
3.0000 mg/kg | Freq: Once | INTRAVENOUS | Status: AC
  Administered 2015-05-13: 220 mg via INTRAVENOUS
  Filled 2015-05-13: qty 22

## 2015-05-13 MED ORDER — SODIUM CHLORIDE 0.9 % IV SOLN
Freq: Once | INTRAVENOUS | Status: AC
  Administered 2015-05-13: 11:00:00 via INTRAVENOUS

## 2015-05-13 NOTE — Progress Notes (Signed)
[facility] Telephone:([phone]) [phone]   Fax:([phone]) [place], [place] [address]  DIAGNOSIS: Stage IV (T1 A., N0, M1 B.) non-small cell lung cancer, adenocarcinoma with negative EGFR mutation and negative ALK gene translocation diagnosed in October of 2014.   Molecular profile: Negative for RET, ALK, BRAF, MET, EGFR.  Positive for ERBB2 A775T, BWL8L373S, KRAS G13E, MAP2K1 D67N (see full report).   PRIOR THERAPY:  1) Systemic chemotherapy with carboplatin for AUC of 5 and Alimta 500 mg/M2 every 3 weeks, status post 6 cycles with stable disease. First dose on 10/23/2013. 2) Maintenance systemic chemotherapy with single agent Alimta 500 mg/M2 every 3 weeks. Status post 8 cycles.   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 Sondra Come completed on 12/16/2014.  CURRENT THERAPY: Immunotherapy with Nivolumab 3 MG/KG every 2 weeks. First cycle 01/28/2015. Status post 6 cycles  CHEMOTHERAPY INTENT: Palliative  CURRENT # OF CHEMOTHERAPY CYCLES: 7 CURRENT ANTIEMETICS: Zofran, Compazine and dexamethasone  CURRENT SMOKING STATUS: Former smoker  ORAL CHEMOTHERAPY AND CONSENT: None  CURRENT BISPHOSPHONATES USE: None  PAIN MANAGEMENT: 0/10  NARCOTICS INDUCED CONSTIPATION: None  LIVING WILL AND CODE STATUS: ?   INTERVAL HISTORY: Anthony Curry 74 y.o. male returns to the clinic today for followup visit accompanied by his wife and daughter. He is tolerating his immunotherapy with Nivolumab relatively well. He continues to report some unsteadiness/instability on his feet and is using a cane to help with ambulation. He also complains of continued dizziness. He was recently evaluated by his cardiologist and had some adjustments made to his blood pressure medications. We also evaluated him with an MRI of his brain with and without contrast to look for  brain metastasis as  a possiblecause for his dizziness. He presents to discuss the results of that study as well as to proceed with cycle #7 of his immunotherapy with Nivolumab. He denied any issues with shortness of breath, any episodes of diarrhea or skin rash.  He denied having any significant fever or chills, no nausea or vomiting.   MEDICAL HISTORY: Past Medical History  Diagnosis Date  . Hypertension   . GERD (gastroesophageal reflux disease)   . Alcohol abuse   . 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 Low sodium   . Persistent atrial fibrillation 04/11/14    chads2vasc score is 2  . Hypotension   . Atrial fibrillation with RVR   . HCAP (healthcare-associated pneumonia)   . Adrenal hemorrhage   . BPH (benign prostatic hyperplasia)   . Hyponatremia   . Anemia of chronic disease   . Fall   . Alcohol intoxication   . Pulmonary nodules   . Adrenal mass   . Atrial flutter by electrocardiogram   . 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 Alcoholism in remission   . Radiation 12/02/14-12/16/14    Left adrenal gland 30 Gy in 10 fractions  . Cancer     small cell/lung  . Neoplastic malignant related fatigue     ALLERGIES:  has No Known Allergies.  MEDICATIONS:  Current Outpatient Prescriptions  Medication Sig Dispense Refill  . ALPRAZolam (XANAX) 0.25 MG tablet Take 0.25 mg by mouth 3 (three) times daily as needed for anxiety or sleep.    [name] apixaban (ELIQUIS) 5 MG TABS tablet Take 1 tablet (5 mg total) by mouth 2 (two) times daily. 180 tablet 0  .  Ascorbic Acid (VITAMIN C PO) Take 1,000 mg by mouth daily.     . B Complex-C (SUPER B COMPLEX PO) Take 1 each by mouth daily.     . Biotin 7500 MCG TABS Take 1 tablet by mouth daily.     . calcium carbonate (OS-CAL) 600 MG TABS Take 600 mg by mouth daily with breakfast.    . cephALEXin (KEFLEX) 500 MG capsule Take 1 capsule (500 mg total) by mouth 4 (four) times daily. 28 capsule 7  . demeclocycline (DECLOMYCIN) 150 MG tablet Take 150 mg  by mouth 2 (two) times daily.    [name] dexamethasone (DECADRON) 4 MG tablet Take 4 mg by mouth See admin instructions. Take 1 tablet twice a day the day before, day of, and day after chemotherapy every three weeks.    [name] diltiazem (CARDIZEM CD) 180 MG 24 hr capsule Take 1 capsule (180 mg total) by mouth daily at 12 noon. 90 capsule 3  . furosemide (LASIX) 40 MG tablet Take 1 tablet (40 mg total) by mouth daily. 90 tablet 3  . KLOR-CON M20 20 MEQ tablet Take 20 mEq by mouth daily.     . Lactobacillus (ACIDOPHILUS) CAPS capsule Take 1 capsule by mouth daily.    [name] MAGNESIUM PO Take 400 mg by mouth daily.     . metoprolol tartrate (LOPRESSOR) 25 MG tablet Take 0.5 tablets (12.5 mg total) by mouth 2 (two) times daily. 180 tablet 1  . mirtazapine (REMERON) 15 MG tablet Take 1 tablet (15 mg total) by mouth at bedtime. 30 tablet 0  . Multiple Vitamin (MULTIVITAMIN WITH MINERALS) TABS Take 1 tablet by mouth daily.    . Nivolumab (OPDIVO IV) Inject as directed every 2 months    . Nutritional Supplements (ENSURE COMPLETE PO) Take 1 Can by mouth 2 (two) times daily.    . Omega-3 Fatty Acids (FISH OIL PO) Take 1 tablet by mouth daily.    [name] omeprazole (PRILOSEC) 20 MG capsule Take 20 mg by mouth daily as needed (heartburn).     . tamsulosin (FLOMAX) 0.4 MG CAPS capsule Take 0.4 mg by mouth daily.     [name] thiamine (VITAMIN B-1) 100 MG tablet Take 1 tablet (100 mg total) by mouth daily. 30 tablet 6  . vitamin E 400 UNIT capsule Take 400 Units by mouth daily.    [name] HYDROcodone-acetaminophen (NORCO/VICODIN) 5-325 MG per tablet Take 1-2 tablets by mouth every 4 (four) hours as needed (pain). (Patient not taking: Reported on 05/13/2015) 30 tablet 0   No current facility-administered medications for this visit.    SURGICAL HISTORY:  Past Surgical History  Procedure Laterality Date  . Hemorrhoid surgery    . Ankle arthrodesis  1995    rt fx-hardware in  . Tonsillectomy    . Colonoscopy    . Excision / curettage bone  cyst finger  2005    rt thumb  . Total shoulder arthroplasty  02/03/2012    Procedure: TOTAL SHOULDER ARTHROPLASTY;  Surgeon: [name], MD;  Location: [facility];  Service: Orthopedics;  Laterality: Left;  . Cardioversion N/A 09/29/2014    Procedure: CARDIOVERSION;  Surgeon: Josue Hector, MD;  Location: [facility] ENDOSCOPY;  Service: Cardiovascular;  Laterality: N/A;    REVIEW OF SYSTEMS:  Constitutional: positive for fatigue Eyes: negative Ears, nose, mouth, throat, and face: negative Respiratory: negative Cardiovascular: negative Gastrointestinal: negative Genitourinary:negative Integument/breast: negative Hematologic/lymphatic: negative Musculoskeletal:negative Neurological: positive for dizziness and gait problems Behavioral/Psych: negative Endocrine: negative  Allergic/Immunologic: negative   PHYSICAL EXAMINATION: General appearance: alert, cooperative, fatigued and no distress Head: Normocephalic, without obvious abnormality, atraumatic Neck: no adenopathy, no JVD, supple, symmetrical, trachea midline and thyroid not enlarged, symmetric, no tenderness/mass/nodules Lymph nodes: Cervical, supraclavicular, and axillary nodes normal. Resp: clear to auscultation bilaterally Back: symmetric, no curvature. ROM normal. No CVA tenderness. Cardio: regular rate and rhythm, S1, S2 normal, no murmur, click, rub or gallop GI: soft, non-tender; bowel sounds normal; no masses,  no organomegaly Extremities: extremities normal, atraumatic, no cyanosis or edema Neurologic: Alert and oriented X 3, normal strength and tone. Normal symmetric reflexes. Normal coordination and gait  ECOG PERFORMANCE STATUS: 1 - Symptomatic but completely ambulatory  Blood pressure 133/64, pulse 90, temperature 97.9 F (36.6 C), temperature source Oral, resp. rate 18, height _0  (1.727 m), weight 157 lb 3.2 oz (71.305 kg), SpO2 95 %.  LABORATORY DATA: Lab Results  Component Value Date   WBC 4.1  05/14/15   HGB 14.5 05/14/15   HCT 41.9 05-14-2015   MCV 92.9 05/14/15   PLT 227 May 14, 2015      Chemistry      Component Value Date/Time   NA 135* 2015/05/14 0938   NA 132* 09/29/2014 1115   K 3.6 2015/05/14 0938   K 3.3* 09/29/2014 1115   CL 91* 09/22/2014 1122   CO2 27 05-14-2015 0938   CO2 30 09/22/2014 1122   BUN 10.6 2015-05-14 0938   BUN 21 09/22/2014 1122   CREATININE 0.8 05/14/15 0938   CREATININE 0.8 09/22/2014 1122      Component Value Date/Time   CALCIUM 9.2 05/14/2015 0938   CALCIUM 8.9 09/22/2014 1122   ALKPHOS 97 05-14-15 0938   ALKPHOS 100 09/02/2014 1400   AST 20 2015/05/14 0938   AST 25 09/02/2014 1400   ALT 16 2015/05/14 0938   ALT 15 09/02/2014 1400   BILITOT 0.78 05-14-2015 0938   BILITOT 0.8 09/02/2014 1400       RADIOGRAPHIC STUDIES: [name] ZO Contrast  05-14-15   CLINICAL DATA:  Lung cancer with dizziness. Evaluate for metastases.  EXAM: MRI HEAD WITHOUT AND WITH CONTRAST  TECHNIQUE: Multiplanar, multiecho pulse sequences of the brain and surrounding structures were obtained without and with intravenous contrast.  CONTRAST:  37m MULTIHANCE GADOBENATE DIMEGLUMINE 529 MG/ML IV SOLN  COMPARISON:  Head CT 09/02/2014  FINDINGS: Calvarium and upper cervical spine: No focal marrow signal abnormality.  Orbits: Bilateral cataract resection.  No orbital mass.  Sinuses and Mastoids: Minor mucosal thickening or fluid in the right mastoid tip. This would not explain dizziness. Mild mucosal thickening in the inferior right maxillary antrum.  Brain: No abnormal intracranial enhancement to suggest metastatic disease. No acute abnormality such as acute infarct, hemorrhage, hydrocephalus, or mass effect. No evidence of large vessel occlusion. Generalized atrophy with ventriculomegaly, stable from 2015. Chronic small-vessel disease is moderate with ischemic gliosis patchy throughout the bilateral cerebral white matter, confluent around the lateral  ventricles.  IMPRESSION: 1. No evidence of intracranial metastatic disease. No explanation for recent onset dizziness. 2. 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   Electronically Signed   By: JMonte FantasiaM.D.   On: 02016/05/907:15   ASSESSMENT AND PLAN: This is a very pleasant 74years old white male with:  1)  stage IV non-small cell lung cancer, adenocarcinoma status post induction chemotherapy with carboplatin and Alimta and currently on maintenance treatment with single agent Alimta status post 8 cycles. He has been observation for  the last few months.  He had evidence for disease progression on the left adrenal gland and the patient underwent palliative radiotherapy to the left adrenal gland under the care of Dr. Sondra Come completed on 12/16/2014. The recent CT scan of the chest, abdomen and pelvis showed improvement/stable disease. Overall he is tolerating  of immunotherapy with Nivolumab relatively well. Patient was discussed with and also seen by Dr. [name]. He will proceed with cycle #7 today as scheduled. He will follow up in 2 weeks prior to the start of cycle #8.  2) atrial fibrillation: He'll continue his current medication with Cardizem, digoxin and metoprolol in addition to Eliquis. He'll also continue his routine followup visit with his cardiologist Dr. [name] as scheduled on 04/29/2015.   3) dizziness: The persistent dizziness is concerning. The MRI of his brain was negative for brain metastasis. There was brain atrophy and chronic small vessel disease. We will see what impact the changes in his blood pressure medications will make. He is encouraged to keep well-hydrated drinking both water and some Gatorade or a similar beverage.   He was advised to call immediately if he has any concerning symptoms in the interval. The patient voices understanding of current disease status and treatment options and is in agreement with the current care plan.  All questions were  answered. The patient knows to call the clinic with any problems, questions or concerns. We can certainly see the patient much sooner if necessary.  Carlton Adam, PA-C 05/13/2015  ADDENDUM: Hematology/Oncology Attending: I had a face to face encounter with the patient today. I recommended his care plan. This is a very pleasant 74 years old white male with metastatic non-small cell lung cancer who is currently undergoing treatment with immunotherapy with Nivolumab status post 6 cycles and tolerating his treatment fairly well. He has issues with dizziness and repeat MRI of the brain showed no evidence for metastatic disease to the brain. He was seen by his cardiologist who adjusted his blood pressure medication. I recommended for the patient to proceed with cycle #7 today as scheduled. He would come back for follow-up visit in 2 weeks for reevaluation before starting the next cycle of his immunotherapy. The patient was advised to call immediately if he has any concerning symptoms in the interval.  Disclaimer: This note was dictated with voice recognition software. Similar sounding words can inadvertently be transcribed and may be missed upon review. Eilleen Kempf., MD 05/13/2015

## 2015-05-13 NOTE — Telephone Encounter (Signed)
Patients wife called and stated that the patient was already taking the furosemide '40mg'$  qd. Our office list indicates that he was taking '40mg'$  bid, but the wife confirmed that the bottle has "take '40mg'$  daily". She would like to know if ok to give him only half qd, which is what she did this morning. Please advise. Thanks, MI

## 2015-05-13 NOTE — Telephone Encounter (Signed)
Lmom for patient to call me and that it is okay for him to take 20 mg daily if he was only on 40 mg daily

## 2015-05-13 NOTE — Telephone Encounter (Signed)
Pt confirmed labs/ov per 06/08 POF, gave pt AVS and Calendar..... KJ

## 2015-05-13 NOTE — Patient Instructions (Signed)
Your MRI of the brain was negative for brain metastasis Follow your cardiologist instructions regarding her blood pressure medications. Stay well-hydrated drinking both water rate and Gatorade or similar beverage Follow-up in 2 weeks prior to next scheduled cycle of immunotherapy

## 2015-05-13 NOTE — Patient Instructions (Addendum)
[facility] Discharge Instructions for Patients   Today you received the following:  Nivolumab To help prevent nausea and vomiting after your treatment, we encourage you to take your nausea medication as prescribed.   If you develop nausea and vomiting that is not controlled by your nausea medication, call the clinic.   BELOW ARE SYMPTOMS THAT SHOULD BE REPORTED IMMEDIATELY:  *FEVER GREATER THAN 100.5 F  *CHILLS WITH OR WITHOUT FEVER  NAUSEA AND VOMITING THAT IS NOT CONTROLLED WITH YOUR NAUSEA MEDICATION  *UNUSUAL SHORTNESS OF BREATH  *UNUSUAL BRUISING OR BLEEDING  TENDERNESS IN MOUTH AND THROAT WITH OR WITHOUT PRESENCE OF ULCERS  *URINARY PROBLEMS  *BOWEL PROBLEMS  UNUSUAL RASH Items with * indicate a potential emergency and should be followed up as soon as possible.  Feel free to call the clinic you have any questions or concerns. The clinic phone number is [phone].  Please show the [facility] at check-in to the Emergency Department and triage nurse.

## 2015-05-19 ENCOUNTER — Telehealth: Payer: Self-pay | Admitting: Internal Medicine

## 2015-05-19 NOTE — Telephone Encounter (Signed)
AM PAL - moved 7/6 appointments to 12:45pm. Spoke with patient and he will get new schedule 05/27/15. Confirmed 05/27/15 appointments.

## 2015-05-27 ENCOUNTER — Ambulatory Visit (HOSPITAL_BASED_OUTPATIENT_CLINIC_OR_DEPARTMENT_OTHER): Payer: Medicare Other | Admitting: Internal Medicine

## 2015-05-27 ENCOUNTER — Ambulatory Visit (HOSPITAL_BASED_OUTPATIENT_CLINIC_OR_DEPARTMENT_OTHER): Payer: Medicare Other

## 2015-05-27 ENCOUNTER — Telehealth: Payer: Self-pay | Admitting: Internal Medicine

## 2015-05-27 ENCOUNTER — Encounter: Payer: Self-pay | Admitting: Internal Medicine

## 2015-05-27 ENCOUNTER — Other Ambulatory Visit (HOSPITAL_BASED_OUTPATIENT_CLINIC_OR_DEPARTMENT_OTHER): Payer: Medicare Other

## 2015-05-27 VITALS — BP 137/58 | HR 84 | Temp 97.7°F | Resp 18 | Ht 68.0 in | Wt 158.5 lb

## 2015-05-27 DIAGNOSIS — C3492 Malignant neoplasm of unspecified part of left bronchus or lung: Secondary | ICD-10-CM

## 2015-05-27 DIAGNOSIS — C7972 Secondary malignant neoplasm of left adrenal gland: Secondary | ICD-10-CM | POA: Diagnosis not present

## 2015-05-27 DIAGNOSIS — Z5112 Encounter for antineoplastic immunotherapy: Secondary | ICD-10-CM | POA: Diagnosis present

## 2015-05-27 DIAGNOSIS — C3410 Malignant neoplasm of upper lobe, unspecified bronchus or lung: Secondary | ICD-10-CM

## 2015-05-27 DIAGNOSIS — R5382 Chronic fatigue, unspecified: Secondary | ICD-10-CM

## 2015-05-27 DIAGNOSIS — C3431 Malignant neoplasm of lower lobe, right bronchus or lung: Secondary | ICD-10-CM

## 2015-05-27 DIAGNOSIS — Z79899 Other long term (current) drug therapy: Secondary | ICD-10-CM | POA: Diagnosis not present

## 2015-05-27 DIAGNOSIS — R530 Neoplastic (malignant) related fatigue: Secondary | ICD-10-CM

## 2015-05-27 DIAGNOSIS — I4891 Unspecified atrial fibrillation: Secondary | ICD-10-CM

## 2015-05-27 LAB — CBC WITH DIFFERENTIAL/PLATELET
BASO%: 0.5 % (ref 0.0–2.0)
Basophils Absolute: 0 10*3/uL (ref 0.0–0.1)
EOS ABS: 0.3 10*3/uL (ref 0.0–0.5)
EOS%: 8 % — ABNORMAL HIGH (ref 0.0–7.0)
HCT: 39.3 % (ref 38.4–49.9)
HEMOGLOBIN: 14.1 g/dL (ref 13.0–17.1)
LYMPH#: 1.1 10*3/uL (ref 0.9–3.3)
LYMPH%: 27.8 % (ref 14.0–49.0)
MCH: 32.7 pg (ref 27.2–33.4)
MCHC: 35.9 g/dL (ref 32.0–36.0)
MCV: 91.2 fL (ref 79.3–98.0)
MONO#: 0.5 10*3/uL (ref 0.1–0.9)
MONO%: 12.8 % (ref 0.0–14.0)
NEUT%: 50.9 % (ref 39.0–75.0)
NEUTROS ABS: 2 10*3/uL (ref 1.5–6.5)
Platelets: 200 10*3/uL (ref 140–400)
RBC: 4.31 10*6/uL (ref 4.20–5.82)
RDW: 13.8 % (ref 11.0–14.6)
WBC: 4 10*3/uL (ref 4.0–10.3)

## 2015-05-27 LAB — COMPREHENSIVE METABOLIC PANEL (CC13)
ALK PHOS: 112 U/L (ref 40–150)
ALT: 20 U/L (ref 0–55)
AST: 23 U/L (ref 5–34)
Albumin: 3.9 g/dL (ref 3.5–5.0)
Anion Gap: 9 mEq/L (ref 3–11)
BUN: 11.7 mg/dL (ref 7.0–26.0)
CO2: 26 mEq/L (ref 22–29)
Calcium: 9.1 mg/dL (ref 8.4–10.4)
Chloride: 99 mEq/L (ref 98–109)
Creatinine: 0.8 mg/dL (ref 0.7–1.3)
EGFR: 88 mL/min/{1.73_m2} — ABNORMAL LOW (ref >90)
GLUCOSE: 124 mg/dL (ref 70–140)
POTASSIUM: 3.3 meq/L — AB (ref 3.5–5.1)
Sodium: 134 mEq/L — ABNORMAL LOW (ref 136–145)
TOTAL PROTEIN: 6.2 g/dL — AB (ref 6.4–8.3)
Total Bilirubin: 0.57 mg/dL (ref 0.20–1.20)

## 2015-05-27 LAB — TSH CHCC: TSH: 5.475 m(IU)/L — ABNORMAL HIGH (ref 0.320–4.118)

## 2015-05-27 MED ORDER — SODIUM CHLORIDE 0.9 % IV SOLN
Freq: Once | INTRAVENOUS | Status: AC
  Administered 2015-05-27: 12:00:00 via INTRAVENOUS

## 2015-05-27 MED ORDER — SODIUM CHLORIDE 0.9 % IV SOLN
3.0000 mg/kg | Freq: Once | INTRAVENOUS | Status: AC
  Administered 2015-05-27: 220 mg via INTRAVENOUS
  Filled 2015-05-27: qty 22

## 2015-05-27 NOTE — Telephone Encounter (Signed)
Gave patient avs report and appointments for July. Patient already on schedule w/MM 7/6.

## 2015-05-27 NOTE — Progress Notes (Signed)
[facility] [phone]   Fax:([phone]) [place], [place] [address]  DIAGNOSIS: Stage IV (T1 A., N0, M1 B.) non-small cell lung cancer, adenocarcinoma with negative EGFR mutation and negative ALK gene translocation diagnosed in October of 2014.   Molecular profile: Negative for RET, ALK, BRAF, MET, EGFR.  Positive for ERBB2 A775T, [id number], KRAS G13E, MAP2K1 D67N (see full report).   PRIOR THERAPY:  1) Systemic chemotherapy with carboplatin for AUC of 5 and Alimta 500 mg/M2 every 3 weeks, status post 6 cycles with stable disease. First dose on 10/23/2013. 2) Maintenance systemic chemotherapy with single agent Alimta 500 mg/M2 every 3 weeks. Status post 8 cycles.   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 Sondra Come completed on 12/16/2014.  CURRENT THERAPY: Immunotherapy with Nivolumab 3 MG/KG every 2 weeks, status post 7 cycles. First cycle 01/28/2015  CHEMOTHERAPY INTENT: Palliative  CURRENT # OF CHEMOTHERAPY CYCLES: 8 CURRENT ANTIEMETICS: Zofran, Compazine and dexamethasone  CURRENT SMOKING STATUS: Former smoker  ORAL CHEMOTHERAPY AND CONSENT: None  CURRENT BISPHOSPHONATES USE: None  PAIN MANAGEMENT: 0/10  NARCOTICS INDUCED CONSTIPATION: None  LIVING WILL AND CODE STATUS: ?   INTERVAL HISTORY: Anthony Curry 74 y.o. male returns to the clinic today for followup visit accompanied by his wife. He is tolerating his current treatment with immunotherapy with Nivolumab fairly well with no significant adverse effects except for mild fatigue. He is feeling much better today. He denied having any significant chest pain, shortness of breath, cough or hemoptysis. [name] He denied having any significant fever or chills, no nausea or vomiting. He is here today to start cycle #8.  MEDICAL HISTORY: Past Medical History  Diagnosis Date  .  Hypertension   . GERD (gastroesophageal reflux disease)   . Alcohol abuse   . 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 Low sodium   . Persistent atrial fibrillation 04/11/14    chads2vasc score is 2  . Hypotension   . Atrial fibrillation with RVR   . HCAP (healthcare-associated pneumonia)   . Adrenal hemorrhage   . BPH (benign prostatic hyperplasia)   . Hyponatremia   . Anemia of chronic disease   . Fall   . Alcohol intoxication   . Pulmonary nodules   . Adrenal mass   . Atrial flutter by electrocardiogram   . 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 Alcoholism in remission   . Radiation 12/02/14-12/16/14    Left adrenal gland 30 Gy in 10 fractions  . Cancer     small cell/lung  . Neoplastic malignant related fatigue     ALLERGIES:  has No Known Allergies.  MEDICATIONS:  Current Outpatient Prescriptions  Medication Sig Dispense Refill  . ALPRAZolam (XANAX) 0.25 MG tablet Take 0.25 mg by mouth 3 (three) times daily as needed for anxiety or sleep.    [name] apixaban (ELIQUIS) 5 MG TABS tablet Take 1 tablet (5 mg total) by mouth 2 (two) times daily. 180 tablet 0  . Ascorbic Acid (VITAMIN C PO) Take 1,000 mg by mouth daily.     . B Complex-C (SUPER B COMPLEX PO) Take 1 each by mouth daily.     . Biotin 7500 MCG TABS Take 1 tablet by mouth daily.     . calcium carbonate (OS-CAL) 600 MG TABS Take 600 mg by mouth daily with breakfast.    . demeclocycline (DECLOMYCIN)  150 MG tablet Take 150 mg by mouth 2 (two) times daily.    [name] dexamethasone (DECADRON) 4 MG tablet Take 4 mg by mouth See admin instructions. Take 1 tablet twice a day the day before, day of, and day after chemotherapy every three weeks.    [name] diltiazem (CARDIZEM CD) 180 MG 24 hr capsule Take 1 capsule (180 mg total) by mouth daily at 12 noon. 90 capsule 3  . furosemide (LASIX) 40 MG tablet Take 1 tablet (40 mg total) by mouth daily. 90 tablet 3  . HYDROcodone-acetaminophen (NORCO/VICODIN) 5-325 MG per tablet  Take 1-2 tablets by mouth every 4 (four) hours as needed (pain). 30 tablet 0  . KLOR-CON M20 20 MEQ tablet Take 20 mEq by mouth daily.     . Lactobacillus (ACIDOPHILUS) CAPS capsule Take 1 capsule by mouth daily.    [name] MAGNESIUM PO Take 400 mg by mouth daily.     . metoprolol tartrate (LOPRESSOR) 25 MG tablet Take 0.5 tablets (12.5 mg total) by mouth 2 (two) times daily. 180 tablet 1  . mirtazapine (REMERON) 15 MG tablet Take 1 tablet (15 mg total) by mouth at bedtime. 30 tablet 0  . Multiple Vitamin (MULTIVITAMIN WITH MINERALS) TABS Take 1 tablet by mouth daily.    . Nivolumab (OPDIVO IV) Inject as directed every 2 months    . Nutritional Supplements (ENSURE COMPLETE PO) Take 1 Can by mouth 2 (two) times daily.    . Omega-3 Fatty Acids (FISH OIL PO) Take 1 tablet by mouth daily.    [name] omeprazole (PRILOSEC) 20 MG capsule Take 20 mg by mouth daily as needed (heartburn).     . tamsulosin (FLOMAX) 0.4 MG CAPS capsule Take 0.4 mg by mouth daily.     [name] thiamine (VITAMIN B-1) 100 MG tablet Take 1 tablet (100 mg total) by mouth daily. 30 tablet 6  . vitamin E 400 UNIT capsule Take 400 Units by mouth daily.     No current facility-administered medications for this visit.    SURGICAL HISTORY:  Past Surgical History  Procedure Laterality Date  . Hemorrhoid surgery    . Ankle arthrodesis  1995    rt fx-hardware in  . Tonsillectomy    . Colonoscopy    . Excision / curettage bone cyst finger  2005    rt thumb  . Total shoulder arthroplasty  02/03/2012    Procedure: TOTAL SHOULDER ARTHROPLASTY;  Surgeon: [name], MD;  Location: [facility];  Service: Orthopedics;  Laterality: Left;  . Cardioversion N/A 09/29/2014    Procedure: CARDIOVERSION;  Surgeon: Josue Hector, MD;  Location: [facility];  Service: Cardiovascular;  Laterality: N/A;    REVIEW OF SYSTEMS:  Constitutional: positive for fatigue Eyes: negative Ears, nose, mouth, throat, and face: negative Respiratory:  negative Cardiovascular: negative Gastrointestinal: negative Genitourinary:negative Integument/breast: negative Hematologic/lymphatic: negative Musculoskeletal:negative Neurological: negative Behavioral/Psych: negative Endocrine: negative Allergic/Immunologic: negative   PHYSICAL EXAMINATION: General appearance: alert, cooperative, fatigued and no distress Head: Normocephalic, without obvious abnormality, atraumatic Neck: no adenopathy, no JVD, supple, symmetrical, trachea midline and thyroid not enlarged, symmetric, no tenderness/mass/nodules Lymph nodes: Cervical, supraclavicular, and axillary nodes normal. Resp: clear to auscultation bilaterally Back: symmetric, no curvature. ROM normal. No CVA tenderness. Cardio: regular rate and rhythm, S1, S2 normal, no murmur, click, rub or gallop GI: soft, non-tender; bowel sounds normal; no masses,  no organomegaly Extremities: extremities normal, atraumatic, no cyanosis or edema Neurologic: Alert and oriented X 3, normal strength and tone.  Normal symmetric reflexes. Normal coordination and gait  ECOG PERFORMANCE STATUS: 1 - Symptomatic but completely ambulatory  Blood pressure 137/58, pulse 84, temperature 97.7 F (36.5 C), temperature source Oral, resp. rate 18, height 5' 8"  (1.727 m), weight 158 lb 8 oz (71.895 kg), SpO2 99 %.  LABORATORY DATA: Lab Results  Component Value Date   WBC 4.0 05/27/2015   HGB 14.1 05/27/2015   HCT 39.3 05/27/2015   MCV 91.2 05/27/2015   PLT 200 05/27/2015      Chemistry      Component Value Date/Time   NA 134* 05/27/2015 0946   NA 132* 09/29/2014 1115   K 3.3* 05/27/2015 0946   K 3.3* 09/29/2014 1115   CL 91* 09/22/2014 1122   CO2 26 05/27/2015 0946   CO2 30 09/22/2014 1122   BUN 11.7 05/27/2015 0946   BUN 21 09/22/2014 1122   CREATININE 0.8 05/27/2015 0946   CREATININE 0.8 09/22/2014 1122      Component Value Date/Time   CALCIUM 9.1 05/27/2015 0946   CALCIUM 8.9 09/22/2014 1122    ALKPHOS 112 05/27/2015 0946   ALKPHOS 100 09/02/2014 1400   AST 23 05/27/2015 0946   AST 25 09/02/2014 1400   ALT 20 05/27/2015 0946   ALT 15 09/02/2014 1400   BILITOT 0.57 05/27/2015 0946   BILITOT 0.8 09/02/2014 1400       RADIOGRAPHIC STUDIES: [name] OV Contrast  06/07/15   CLINICAL DATA:  Lung cancer with dizziness. Evaluate for metastases.  EXAM: MRI HEAD WITHOUT AND WITH CONTRAST  TECHNIQUE: Multiplanar, multiecho pulse sequences of the brain and surrounding structures were obtained without and with intravenous contrast.  CONTRAST:  74m MULTIHANCE GADOBENATE DIMEGLUMINE 529 MG/ML IV SOLN  COMPARISON:  Head CT 09/02/2014  FINDINGS: Calvarium and upper cervical spine: No focal marrow signal abnormality.  Orbits: Bilateral cataract resection.  No orbital mass.  Sinuses and Mastoids: Minor mucosal thickening or fluid in the right mastoid tip. This would not explain dizziness. Mild mucosal thickening in the inferior right maxillary antrum.  Brain: No abnormal intracranial enhancement to suggest metastatic disease. No acute abnormality such as acute infarct, hemorrhage, hydrocephalus, or mass effect. No evidence of large vessel occlusion. Generalized atrophy with ventriculomegaly, stable from 2015. Chronic small-vessel disease is moderate with ischemic gliosis patchy throughout the bilateral cerebral white matter, confluent around the lateral ventricles.  IMPRESSION: 1. No evidence of intracranial metastatic disease. No explanation for recent onset dizziness. 2. 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   Electronically Signed   By: JMonte FantasiaM.D.   On: [id number]   ASSESSMENT AND PLAN: This is a very pleasant 74years old white male with:  1)  stage IV non-small cell lung cancer, adenocarcinoma status post induction chemotherapy with carboplatin and Alimta and currently on maintenance treatment with single agent Alimta status post 8 cycles. He has been observation for the  last few months.  He had evidence for disease progression on the left adrenal gland and the patient underwent palliative radiotherapy to the left adrenal gland under the care of Dr. KSondra Comecompleted on 12/16/2014. He is currently undergoing treatment with immunotherapy with Nivolumab status post 7 cycles and tolerating his treatment fairly well. The patient would come back for follow-up visit in 2 weeks with the start of cycle #9 after repeating CT scan of the chest, abdomen and pelvis for restaging of his disease.  2) atrial fibrillation: He'll continue his current medication with Cardizem, digoxin and metoprolol  in addition to Eliquis. He'll also continue his routine followup visit with his cardiologist Dr. [name].  He was advised to call immediately if he has any concerning symptoms in the interval. The patient voices understanding of current disease status and treatment options and is in agreement with the current care plan.  All questions were answered. The patient knows to call the clinic with any problems, questions or concerns. We can certainly see the patient much sooner if necessary.  Disclaimer: This note was dictated with voice recognition software. Similar sounding words can inadvertently be transcribed and may not be corrected upon review.

## 2015-05-27 NOTE — Patient Instructions (Signed)
[facility] Discharge Instructions for Patients   Today you received the following:  Nivolumab To help prevent nausea and vomiting after your treatment, we encourage you to take your nausea medication as prescribed.   If you develop nausea and vomiting that is not controlled by your nausea medication, call the clinic.   BELOW ARE SYMPTOMS THAT SHOULD BE REPORTED IMMEDIATELY:  *FEVER GREATER THAN 100.5 F  *CHILLS WITH OR WITHOUT FEVER  NAUSEA AND VOMITING THAT IS NOT CONTROLLED WITH YOUR NAUSEA MEDICATION  *UNUSUAL SHORTNESS OF BREATH  *UNUSUAL BRUISING OR BLEEDING  TENDERNESS IN MOUTH AND THROAT WITH OR WITHOUT PRESENCE OF ULCERS  *URINARY PROBLEMS  *BOWEL PROBLEMS  UNUSUAL RASH Items with * indicate a potential emergency and should be followed up as soon as possible.  Feel free to call the clinic you have any questions or concerns. The clinic phone number is [phone].  Please show the [facility] at check-in to the Emergency Department and triage nurse.

## 2015-06-01 ENCOUNTER — Emergency Department (HOSPITAL_COMMUNITY): Payer: Medicare Other

## 2015-06-01 ENCOUNTER — Other Ambulatory Visit: Payer: Self-pay

## 2015-06-01 ENCOUNTER — Encounter (HOSPITAL_COMMUNITY): Payer: Self-pay | Admitting: Family Medicine

## 2015-06-01 ENCOUNTER — Emergency Department (HOSPITAL_COMMUNITY)
Admission: EM | Admit: 2015-06-01 | Discharge: 2015-06-01 | Disposition: A | Discharge status: Home / Self Care | Payer: Medicare Other | Attending: Emergency Medicine | Admitting: Emergency Medicine

## 2015-06-01 DIAGNOSIS — Z85118 Personal history of other malignant neoplasm of bronchus and lung: Secondary | ICD-10-CM | POA: Diagnosis not present

## 2015-06-01 DIAGNOSIS — R072 Precordial pain: Secondary | ICD-10-CM | POA: Diagnosis not present

## 2015-06-01 DIAGNOSIS — N400 Enlarged prostate without lower urinary tract symptoms: Secondary | ICD-10-CM | POA: Diagnosis not present

## 2015-06-01 DIAGNOSIS — Z923 Personal history of irradiation: Secondary | ICD-10-CM | POA: Diagnosis not present

## 2015-06-01 DIAGNOSIS — K219 Gastro-esophageal reflux disease without esophagitis: Secondary | ICD-10-CM | POA: Diagnosis not present

## 2015-06-01 DIAGNOSIS — I10 Essential (primary) hypertension: Secondary | ICD-10-CM | POA: Diagnosis not present

## 2015-06-01 DIAGNOSIS — Z8701 Personal history of pneumonia (recurrent): Secondary | ICD-10-CM | POA: Diagnosis not present

## 2015-06-01 DIAGNOSIS — I4891 Unspecified atrial fibrillation: Secondary | ICD-10-CM | POA: Insufficient documentation

## 2015-06-01 DIAGNOSIS — M1990 Unspecified osteoarthritis, unspecified site: Secondary | ICD-10-CM | POA: Insufficient documentation

## 2015-06-01 DIAGNOSIS — Z9221 Personal history of antineoplastic chemotherapy: Secondary | ICD-10-CM | POA: Insufficient documentation

## 2015-06-01 DIAGNOSIS — R079 Chest pain, unspecified: Secondary | ICD-10-CM | POA: Insufficient documentation

## 2015-06-01 DIAGNOSIS — Z79899 Other long term (current) drug therapy: Secondary | ICD-10-CM | POA: Insufficient documentation

## 2015-06-01 DIAGNOSIS — Z862 Personal history of diseases of the blood and blood-forming organs and certain disorders involving the immune mechanism: Secondary | ICD-10-CM | POA: Diagnosis not present

## 2015-06-01 DIAGNOSIS — Z87891 Personal history of nicotine dependence: Secondary | ICD-10-CM | POA: Insufficient documentation

## 2015-06-01 DIAGNOSIS — R918 Other nonspecific abnormal finding of lung field: Secondary | ICD-10-CM | POA: Diagnosis not present

## 2015-06-01 DIAGNOSIS — R0789 Other chest pain: Secondary | ICD-10-CM | POA: Diagnosis not present

## 2015-06-01 LAB — BASIC METABOLIC PANEL
ANION GAP: 12 (ref 5–15)
BUN: 12 mg/dL (ref 6–20)
CALCIUM: 9.3 mg/dL (ref 8.9–10.3)
CHLORIDE: 94 mmol/L — AB (ref 101–111)
CO2: 22 mmol/L (ref 22–32)
Creatinine, Ser: 0.69 mg/dL (ref 0.61–1.24)
GFR calc Af Amer: >60 mL/min (ref >60)
GFR calc non Af Amer: >60 mL/min (ref >60)
Glucose, Bld: 146 mg/dL — ABNORMAL HIGH (ref 65–99)
POTASSIUM: 3.3 mmol/L — AB (ref 3.5–5.1)
SODIUM: 128 mmol/L — AB (ref 135–145)

## 2015-06-01 LAB — I-STAT TROPONIN, ED
Troponin i, poc: 0.01 ng/mL (ref 0.00–0.08)
Troponin i, poc: 0 ng/mL (ref 0.00–0.08)

## 2015-06-01 LAB — CBC
HCT: 40.3 % (ref 39.0–52.0)
HEMOGLOBIN: 14.5 g/dL (ref 13.0–17.0)
MCH: 32.5 pg (ref 26.0–34.0)
MCHC: 36 g/dL (ref 30.0–36.0)
MCV: 90.4 fL (ref 78.0–100.0)
Platelets: 235 10*3/uL (ref 150–400)
RBC: 4.46 MIL/uL (ref 4.22–5.81)
RDW: 13.6 % (ref 11.5–15.5)
WBC: 9.8 10*3/uL (ref 4.0–10.5)

## 2015-06-01 MED ORDER — FENTANYL CITRATE (PF) 100 MCG/2ML IJ SOLN
50.0000 ug | Freq: Once | INTRAMUSCULAR | Status: DC
  Administered 2015-06-01: 50 ug via INTRAVENOUS

## 2015-06-01 MED ORDER — SUCRALFATE 1 G PO TABS: 1.0000 g | ORAL_TABLET | Freq: Three times a day (TID) | ORAL | Status: DC

## 2015-06-01 MED ORDER — NITROGLYCERIN 0.4 MG SL SUBL: 0.4000 mg | SUBLINGUAL_TABLET | SUBLINGUAL | Status: DC | PRN

## 2015-06-01 MED ORDER — FENTANYL CITRATE (PF) 100 MCG/2ML IJ SOLN
50.0000 ug | Freq: Once | INTRAMUSCULAR | Status: AC
  Administered 2015-06-01: 50 ug via INTRAVENOUS
  Filled 2015-06-01: qty 2

## 2015-06-01 MED ORDER — HYDROCODONE-ACETAMINOPHEN 5-325 MG PO TABS: 1.0000 | ORAL_TABLET | Freq: Four times a day (QID) | ORAL | Status: DC | PRN

## 2015-06-01 MED ORDER — ASPIRIN 81 MG PO CHEW
324.0000 mg | CHEWABLE_TABLET | Freq: Once | ORAL | Status: DC
  Filled 2015-06-01: qty 4

## 2015-06-01 MED ORDER — FENTANYL CITRATE (PF) 100 MCG/2ML IJ SOLN
INTRAMUSCULAR | Status: AC
  Filled 2015-06-01: qty 2

## 2015-06-01 MED ORDER — ONDANSETRON 8 MG PO TBDP: 8.0000 mg | ORAL_TABLET | Freq: Three times a day (TID) | ORAL | Status: DC | PRN

## 2015-06-01 MED ORDER — IOHEXOL 350 MG/ML SOLN
100.0000 mL | Freq: Once | INTRAVENOUS | Status: AC | PRN
  Administered 2015-06-01: 100 mL via INTRAVENOUS

## 2015-06-01 NOTE — ED Notes (Signed)
Patient gone to ct , family is in the room.

## 2015-06-01 NOTE — Discharge Instructions (Signed)
Chest Pain (Nonspecific) °It is often hard to give a specific diagnosis for the cause of chest pain. There is always a chance that your pain could be related to something serious, such as a heart attack or a blood clot in the lungs. You need to follow up with your health care provider for further evaluation. °CAUSES  °· Heartburn. °· Pneumonia or bronchitis. °· Anxiety or stress. °· Inflammation around your heart (pericarditis) or lung (pleuritis or pleurisy). °· A blood clot in the lung. °· A collapsed lung (pneumothorax). It can develop suddenly on its own (spontaneous pneumothorax) or from trauma to the chest. °· Shingles infection (herpes zoster virus). °The chest wall is composed of bones, muscles, and cartilage. Any of these can be the source of the pain. °· The bones can be bruised by injury. °· The muscles or cartilage can be strained by coughing or overwork. °· The cartilage can be affected by inflammation and become sore (costochondritis). °DIAGNOSIS  °Lab tests or other studies may be needed to find the cause of your pain. Your health care provider may have you take a test called an ambulatory electrocardiogram (ECG). An ECG records your heartbeat patterns over a 24-hour period. You may also have other tests, such as: °· Transthoracic echocardiogram (TTE). During echocardiography, sound waves are used to evaluate how blood flows through your heart. °· Transesophageal echocardiogram (TEE). °· Cardiac monitoring. This allows your health care provider to monitor your heart rate and rhythm in real time. °· Holter monitor. This is a portable device that records your heartbeat and can help diagnose heart arrhythmias. It allows your health care provider to track your heart activity for several days, if needed. °· Stress tests by exercise or by giving medicine that makes the heart beat faster. °TREATMENT  °· Treatment depends on what may be causing your chest pain. Treatment may include: °· Acid blockers for  heartburn. °· Anti-inflammatory medicine. °· Pain medicine for inflammatory conditions. °· Antibiotics if an infection is present. °· You may be advised to change lifestyle habits. This includes stopping smoking and avoiding alcohol, caffeine, and chocolate. °· You may be advised to keep your head raised (elevated) when sleeping. This reduces the chance of acid going backward from your stomach into your esophagus. °Most of the time, nonspecific chest pain will improve within 2-3 days with rest and mild pain medicine.  °HOME CARE INSTRUCTIONS  °· If antibiotics were prescribed, take them as directed. Finish them even if you start to feel better. °· For the next few days, avoid physical activities that bring on chest pain. Continue physical activities as directed. °· Do not use any tobacco products, including cigarettes, chewing tobacco, or electronic cigarettes. °· Avoid drinking alcohol. °· Only take medicine as directed by your health care provider. °· Follow your health care provider's suggestions for further testing if your chest pain does not go away. °· Keep any follow-up appointments you made. If you do not go to an appointment, you could develop lasting (chronic) problems with pain. If there is any problem keeping an appointment, call to reschedule. °SEEK MEDICAL CARE IF:  °· Your chest pain does not go away, even after treatment. °· You have a rash with blisters on your chest. °· You have a fever. °SEEK IMMEDIATE MEDICAL CARE IF:  °· You have increased chest pain or pain that spreads to your arm, neck, jaw, back, or abdomen. °· You have shortness of breath. °· You have an increasing cough, or you cough   up blood.  You have severe back or abdominal pain.  You feel nauseous or vomit.  You have severe weakness.  You faint.  You have chills. This is an emergency. Do not wait to see if the pain will go away. Get medical help at once. Call your local emergency services (911 in [place] Do not drive  yourself to the hospital. MAKE SURE YOU:   Understand these instructions.  Will watch your condition.  Will get help right away if you are not doing well or get worse. Document Released: 08/31/2005 Document Revised: 11/26/2013 Document Reviewed: 06/26/2008 [facility] Patient Information 2015 [place], [place]. This information is not intended to replace advice given to you by your health care provider. Make sure you discuss any questions you have with your health care provider. Esophageal Spasm Esophageal spasm is an uncoordinated contraction of the muscles of the esophagus (the tube which carries food from your mouth to your stomach). Normally, the muscles of the esophagus alternate between contraction and relaxation starting from the top of the esophagus and working down to the bottom. This moves the food from the mouth to the stomach. In esophageal spasm, all the muscles contract at once. This causes pain and fails to move the food along. As a result, you may have trouble swallowing.  Women are more likely than men to have esophageal spasm. The cause of the spasms is not known. Sometimes eating hot or cold foods triggers the condition and this may be due to an overly sensitive esophagus. This is not an infectious disease and cannot be passed to others. SYMPTOMS  Symptoms of esophageal spasm may include: chest pain, burning or pain with swallowing, and difficulty swallowing.  DIAGNOSIS  Esophageal spasm can be diagnosed by a test called manometry (pressure studies of the esophagus). In this test, a special tube is inserted down the esophagus. The tube measures the muscle activity of the esophagus. Abnormal contractions mixed with normal movement helps confirm the diagnosis.  A person with a hypersensitive esophagus may be diagnosed by inflating a long balloon in the person's esophagus. If this causes the same symptoms, preventive methods may work. PREVENTION  Avoid hot or cold foods if that seems to  be a trigger. PROGNOSIS  This condition does not go away, nor is treatment entirely satisfactory. Patients need to be careful of what they eat. They need to continue on medication if a useful one is found. Fortunately, the condition does not get progressively worse as time passes. Esophageal spasm does not usually lead to more serious problems but sometimes the pain can be disabling. If a person becomes afraid to eat they may become malnourished and lose weight.  TREATMENT   A procedure in which instruments of increasing size are inserted through the esophagus to enlarge (dilate) it are used.  Medications that decrease acid-production of the stomach may be used such as proton-pump inhibitors or H2-blockers.  Medications of several types can be used to relax the muscles of the esophagus.  An individual with a hypersensitive esophagus sometimes improves with low doses of medications normally used for depression.  No treatment for esophageal spasm is effective for everyone. Often several approaches will be tried before one works. In many cases, the symptoms will improve, but will not go away completely.  For severe cases, relief is obtained two-thirds of the time by cutting the muscles along the entire length of the esophagus. This is a major surgical procedure.  Your symptoms are usually the best guide to  how well the treatment for esophageal spasm works. SIDE EFFECTS OF TREATMENTS  Nitrates can cause headaches and low blood pressure.  Calcium channel blockers can cause:  Feeling sick to your stomach (nausea).  Constipation and other side effects.  Antidepressants can cause side effects that depend on the medication used. HOME CARE INSTRUCTIONS   Let your caregiver know if problems are getting worse, or if you get food stuck in your esophagus for longer than 1 hour or as directed and are unable to swallow liquid.  Take medications as directed and with permission of your caregiver. Ask  about what to do if a medication seems to get stuck in your esophagus. Only take over-the-counter or prescription medicines for pain, discomfort, or fever as directed by your caregiver.  Soft and liquid foods pass more easily than solid pieces. SEEK IMMEDIATE MEDICAL CARE IF:   You develop severe chest pain, especially if the pain is crushing or pressure-like and spreads to the arms, back, neck, or jaw, or if you have sweating, nausea, or shortness of breath. THIS COULD BE AN EMERGENCY. Do not wait to see if the pain will go away. Get medical help at once. Call 911 or 0 (operator). DO NOT drive yourself to the hospital.  Your chest pain gets worse and does not go away with rest.  You have an attack of chest pain lasting longer than usual despite rest and treatment with the medications your physician has prescribed.  You wake from sleep with chest pain or shortness of breath.  You feel dizzy or faint.  You have chest pain, not typical of your usual pain, caused by your esophagus for which you originally saw your caregiver. MAKE SURE YOU:   Understand these instructions.  Will watch your condition.  Will get help right away if you are not doing well or get worse. Document Released: 02/11/2003 Document Revised: 02/13/2012 Document Reviewed: 02/14/2014 [facility] Patient Information 2015 [place], [place]. This information is not intended to replace advice given to you by your health care provider. Make sure you discuss any questions you have with your health care provider.

## 2015-06-01 NOTE — ED Notes (Signed)
Patient states he is experiencing mid sternal chest pain that started yesterday. Pt has took Omeprazole at 17:00 with no relief. No radiation of pain. Reports lightheaded, nausea, and vomiting. Pt is a lung cancer patient is under treatment.

## 2015-06-01 NOTE — ED Provider Notes (Signed)
CSN: [id number]     Arrival date & time 06/01/15  0101 History   First MD Initiated Contact with Patient 06/01/15 0221     Chief Complaint  Patient presents with  . Chest Pain     (Consider location/radiation/quality/duration/timing/severity/associated sxs/prior Treatment) HPI Comments: Pt comes in with cc of chest pain. Pt has hx of stage 4 lung CA, no CAD hx. He reports that he started having chest pain at 10 am, and the pain is midsternal, non radiating, severe. Pt has no n/v/f/c. Pt thought the pain was from GERD, and he took meds with no relief. He rates the pain at 10/10, as it has progressively gotten worse. Pt has no hx of PE, DVT and denies any exogenous estrogen use, long distance travels or surgery in the past 6 weeks, recent immobilization. He denies any dib. Pt's pain is worse when supine. + cough, no hemoptysis.   ROS 10 Systems reviewed and are negative for acute change except as noted in the HPI.      Patient is a 74 y.o. male presenting with chest pain. The history is provided by the patient.  Chest Pain   Past Medical History  Diagnosis Date  . Hypertension   . GERD (gastroesophageal reflux disease)   . Alcohol abuse   . 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 Low sodium   . Persistent atrial fibrillation 04/11/14    chads2vasc score is 2  . Hypotension   . Atrial fibrillation with RVR   . HCAP (healthcare-associated pneumonia)   . Adrenal hemorrhage   . BPH (benign prostatic hyperplasia)   . Hyponatremia   . Anemia of chronic disease   . Fall   . Alcohol intoxication   . Pulmonary nodules   . Adrenal mass   . Atrial flutter by electrocardiogram   . 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 Alcoholism in remission   . Radiation 12/02/14-12/16/14    Left adrenal gland 30 Gy in 10 fractions  . Cancer     small cell/lung  . Neoplastic malignant related fatigue    Past Surgical History  Procedure Laterality Date  . Hemorrhoid surgery     . Ankle arthrodesis  1995    rt fx-hardware in  . Tonsillectomy    . Colonoscopy    . Excision / curettage bone cyst finger  2005    rt thumb  . Total shoulder arthroplasty  02/03/2012    Procedure: TOTAL SHOULDER ARTHROPLASTY;  Surgeon: [name], MD;  Location: [facility];  Service: Orthopedics;  Laterality: Left;  . Cardioversion N/A 09/29/2014    Procedure: CARDIOVERSION;  Surgeon: Josue Hector, MD;  Location: [facility] ENDOSCOPY;  Service: Cardiovascular;  Laterality: N/A;   Family History  Problem Relation Age of Onset  . Brain cancer Brother   . Lung cancer Mother   . Hypertension Father   . Diabetes Paternal Grandmother   . Heart attack Father   . Heart attack Brother    History  Substance Use Topics  . Smoking status: Former Smoker -- 2.00 packs/day for 30 years    Types: Cigarettes    Quit date: 12/05/1990  . Smokeless tobacco: Never Used  . Alcohol Use: No     Comment: does not drink now    Review of Systems  Cardiovascular: Positive for chest pain.  All other systems reviewed and are negative.     Allergies  Review of patient's allergies indicates no known allergies.  Home Medications  Prior to Admission medications   Medication Sig Start Date End Date Taking? Authorizing Provider  ALPRAZolam (XANAX) 0.25 MG tablet Take 0.25 mg by mouth 3 (three) times daily as needed for anxiety or sleep.   Yes Historical Provider, MD  apixaban (ELIQUIS) 5 MG TABS tablet Take 1 tablet (5 mg total) by mouth 2 (two) times daily. 12/12/14  Yes Thompson Grayer, MD  Ascorbic Acid (VITAMIN C PO) Take 1,000 mg by mouth daily.    Yes Historical Provider, MD  B Complex-C (SUPER B COMPLEX PO) Take 1 each by mouth daily.    Yes Historical Provider, MD  Biotin 7500 MCG TABS Take 1 tablet by mouth daily.    Yes Historical Provider, MD  calcium carbonate (OS-CAL) 600 MG TABS Take 600 mg by mouth daily with breakfast.   Yes Historical Provider, MD  demeclocycline  (DECLOMYCIN) 150 MG tablet Take 150 mg by mouth 2 (two) times daily.   Yes Historical Provider, MD  diltiazem (CARDIZEM CD) 180 MG 24 hr capsule Take 1 capsule (180 mg total) by mouth daily at 12 noon. 01/09/15  Yes Sherran Needs, NP  furosemide (LASIX) 40 MG tablet Take 1 tablet (40 mg total) by mouth daily. 05/11/15  Yes Thompson Grayer, MD  KLOR-CON M20 20 MEQ tablet Take 20 mEq by mouth daily.  10/18/14  Yes Historical Provider, MD  Lactobacillus (ACIDOPHILUS) CAPS capsule Take 1 capsule by mouth daily.   Yes Historical Provider, MD  MAGNESIUM PO Take 400 mg by mouth daily.    Yes Historical Provider, MD  metoprolol tartrate (LOPRESSOR) 25 MG tablet Take 0.5 tablets (12.5 mg total) by mouth 2 (two) times daily. 05/11/15  Yes Thompson Grayer, MD  mirtazapine (REMERON) 15 MG tablet Take 1 tablet (15 mg total) by mouth at bedtime. 01/19/15  Yes [name], MD  Multiple Vitamin (MULTIVITAMIN WITH MINERALS) TABS Take 1 tablet by mouth daily.   Yes Historical Provider, MD  Nivolumab (OPDIVO IV) Inject as directed every 2 months   Yes Historical Provider, MD  Nutritional Supplements (ENSURE COMPLETE PO) Take 1 Can by mouth 2 (two) times daily.   Yes Historical Provider, MD  Omega-3 Fatty Acids (FISH OIL PO) Take 1 tablet by mouth daily.   Yes Historical Provider, MD  omeprazole (PRILOSEC) 20 MG capsule Take 20 mg by mouth daily as needed (heartburn).    Yes Historical Provider, MD  tamsulosin (FLOMAX) 0.4 MG CAPS capsule Take 0.4 mg by mouth daily.    Yes Historical Provider, MD  thiamine (VITAMIN B-1) 100 MG tablet Take 1 tablet (100 mg total) by mouth daily. 09/11/13  Yes Erick Colace, NP  vitamin E 400 UNIT capsule Take 400 Units by mouth daily.   Yes Historical Provider, MD  dexamethasone (DECADRON) 4 MG tablet Take 4 mg by mouth See admin instructions. Take 1 tablet twice a day the day before, day of, and day after chemotherapy every three weeks.    Historical Provider, MD  HYDROcodone-acetaminophen  (NORCO/VICODIN) 5-325 MG per tablet Take 1 tablet by mouth every 6 (six) hours as needed. 06/01/15   Varney Biles, MD  ondansetron (ZOFRAN ODT) 8 MG disintegrating tablet Take 1 tablet (8 mg total) by mouth every 8 (eight) hours as needed for nausea. 06/01/15   Varney Biles, MD  sucralfate (CARAFATE) 1 G tablet Take 1 tablet (1 g total) by mouth 4 (four) times daily -  with meals and at bedtime. 06/01/15   Varney Biles, MD   BP  148/71 mmHg  Pulse 77  Temp(Src) 97.8 F (36.6 C) (Oral)  Resp 18  Ht 6' (1.829 m)  Wt 160 lb (72.576 kg)  BMI 21.70 kg/m2  SpO2 96% Physical Exam  Constitutional: He is oriented to person, place, and time. He appears well-developed.  HENT:  Head: Normocephalic and atraumatic.  Eyes: Conjunctivae and EOM are normal. Pupils are equal, round, and reactive to light.  Neck: Normal range of motion. Neck supple. No JVD present.  Cardiovascular: Normal rate and regular rhythm.   No murmur heard. Pulmonary/Chest: Effort normal and breath sounds normal.  Abdominal: Soft. Bowel sounds are normal. He exhibits no distension. There is no tenderness. There is no rebound and no guarding.  Musculoskeletal: He exhibits no edema or tenderness.  Neurological: He is alert and oriented to person, place, and time.  Skin: Skin is warm.  Nursing note and vitals reviewed.   ED Course  Procedures (including critical care time) Labs Review Labs Reviewed  BASIC METABOLIC PANEL - Abnormal; Notable for the following:    Sodium 128 (*)    Potassium 3.3 (*)    Chloride 94 (*)    Glucose, Bld 146 (*)    All other components within normal limits  CBC  I-STAT TROPOININ, ED  Randolm Idol, ED    Imaging Review Dg Chest 2 View  06/01/2015   CLINICAL DATA:  Atrial fibrillation.  Hypotension.  EXAM: CHEST  2 VIEW  COMPARISON:  04/10/2015  FINDINGS: There is moderate hyperinflation. There are mild linear opacities bilaterally which are not significantly changed and this may  represent scarring. There is no confluent airspace opacity. There is no effusion. Heart size is normal. Pulmonary vasculature is normal.  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  IMPRESSION: Bilateral linear central and basilar opacities, likely scarring. Multiple ununited right rib fractures. No acute cardiopulmonary findings.   Electronically Signed   By: Andreas Newport M.D.   On: 06/01/2015 02:55   Ct Angio Chest Pe W/cm &/or Wo Cm  06/01/2015   CLINICAL DATA:  Midsternal chest pain, onset yesterday.  EXAM: CT ANGIOGRAPHY CHEST WITH CONTRAST  TECHNIQUE: Multidetector CT imaging of the chest was performed using the standard protocol during bolus administration of intravenous contrast. Multiplanar CT image reconstructions and MIPs were obtained to evaluate the vascular anatomy.  CONTRAST:  172m OMNIPAQUE IOHEXOL 350 MG/ML SOLN  COMPARISON:  04/10/2015  FINDINGS: Cardiovascular: There is good opacification of the pulmonary arteries. There is no pulmonary embolism. The thoracic aorta is normal in caliber and intact.  Lungs: Unchanged 5 x 14 mm medial left apical spiculated nodule. Unchanged smaller nodules elsewhere in the left upper lobe. New or worsened ground-glass opacities in both upper lobes. Underlying emphysematous changes.  Central airways: Patent. Small amount of retained secretions in the trachea.  Effusions: None  Lymphadenopathy: None  Esophagus: Unremarkable  Upper abdomen: Unremarkable  Musculoskeletal: Asymmetric right gynecomastia, unchanged  Review of the MIP images confirms the above findings.  IMPRESSION: 1. Negative for pulmonary embolism 2. New or worsened mild ground-glass opacities in both upper lobes, superimposed on extensive emphysematous changes. This could represent infectious infiltrate. Alveolar edema is a possibility. Neoplastic ground-glass opacities are less  likely. Unchanged spiculated medial left apical nodule as well as smaller upper lobe nodules. 3. Small amount of retained secretions in the trachea.   Electronically Signed   By: DValerie RoysD.  On: 06/01/2015 04:35     EKG Interpretation   Date/Time:  Monday June 01 2015 01:08:12 EDT Ventricular Rate:  69 PR Interval:  213 QRS Duration: 90 QT Interval:  413 QTC Calculation: 442 R Axis:   87 Text Interpretation:  Sinus rhythm Borderline prolonged PR interval  Borderline right axis deviation Borderline T abnormalities, anterior leads  No significant change since last tracing Confirmed by Kathrynn Humble, MD, Yardley Lekas  [phone]) on 06/01/2015 2:31:51 AM    EKG Interpretation  Date/Time:  Monday June 01 2015 01:36:56 EDT Ventricular Rate:  69 PR Interval:  210 QRS Duration: 88 QT Interval:  409 QTC Calculation: 438 R Axis:   88 Text Interpretation:  Sinus rhythm Borderline right axis deviation Borderline T abnormalities, anterior leads has not changed Confirmed by Algenis Ballin, MD, Kaimana Lurz ([id number]) on 06/01/2015 3:25:31 AM      Prior to discharge, chest pain had improved considerably and pt was much comfortable. Results from the ER discussed. PT has been provided GI f/u - in case the pain is Gi in etiology.   MDM   Final diagnoses:  Chest pain    Differential diagnosis includes: ACS syndrome CHF exacerbation Pericarditis Pericardial effusion Pneumonia Pleural effusion Pulmonary edema PE Anemia Musculoskeletal pain Necrosis of the tumor  Pt comes in with chest pain. Chest pain is atypical, not pleuritic, or cardiac in nature - but it is severe. Vascular exam is neg. There is no evidence of CHF. EKG and exam not showing any signs of pericarditis. Unsure what the pain is from - but with his cancer hx, it is possible that there is might be obstructive pathology or even tumor necrosis causing the severe pain. Xrays ordered - and neg, so we will get CT, as the pain continues to be  severe.     Varney Biles, MD 06/02/15 1928

## 2015-06-02 DIAGNOSIS — Z1389 Encounter for screening for other disorder: Secondary | ICD-10-CM | POA: Diagnosis not present

## 2015-06-02 DIAGNOSIS — K219 Gastro-esophageal reflux disease without esophagitis: Secondary | ICD-10-CM | POA: Diagnosis not present

## 2015-06-09 ENCOUNTER — Ambulatory Visit (HOSPITAL_COMMUNITY)
Admission: RE | Admit: 2015-06-09 | Discharge: 2015-06-09 | Disposition: A | Discharge status: Home / Self Care | Payer: Medicare Other | Source: Ambulatory Visit | Attending: Internal Medicine | Admitting: Internal Medicine

## 2015-06-09 ENCOUNTER — Encounter (HOSPITAL_COMMUNITY): Payer: Self-pay

## 2015-06-09 ENCOUNTER — Telehealth: Payer: Self-pay | Admitting: *Deleted

## 2015-06-09 DIAGNOSIS — R530 Neoplastic (malignant) related fatigue: Secondary | ICD-10-CM

## 2015-06-09 DIAGNOSIS — K219 Gastro-esophageal reflux disease without esophagitis: Secondary | ICD-10-CM | POA: Diagnosis present

## 2015-06-09 DIAGNOSIS — Z85118 Personal history of other malignant neoplasm of bronchus and lung: Secondary | ICD-10-CM | POA: Insufficient documentation

## 2015-06-09 DIAGNOSIS — R079 Chest pain, unspecified: Secondary | ICD-10-CM

## 2015-06-09 DIAGNOSIS — M19012 Primary osteoarthritis, left shoulder: Secondary | ICD-10-CM | POA: Diagnosis present

## 2015-06-09 DIAGNOSIS — Z833 Family history of diabetes mellitus: Secondary | ICD-10-CM

## 2015-06-09 DIAGNOSIS — K810 Acute cholecystitis: Secondary | ICD-10-CM | POA: Diagnosis not present

## 2015-06-09 DIAGNOSIS — Z923 Personal history of irradiation: Secondary | ICD-10-CM | POA: Insufficient documentation

## 2015-06-09 DIAGNOSIS — R1011 Right upper quadrant pain: Secondary | ICD-10-CM | POA: Diagnosis not present

## 2015-06-09 DIAGNOSIS — K5900 Constipation, unspecified: Secondary | ICD-10-CM | POA: Diagnosis present

## 2015-06-09 DIAGNOSIS — I119 Hypertensive heart disease without heart failure: Secondary | ICD-10-CM | POA: Diagnosis present

## 2015-06-09 DIAGNOSIS — C3490 Malignant neoplasm of unspecified part of unspecified bronchus or lung: Secondary | ICD-10-CM | POA: Diagnosis present

## 2015-06-09 DIAGNOSIS — N62 Hypertrophy of breast: Secondary | ICD-10-CM

## 2015-06-09 DIAGNOSIS — C7970 Secondary malignant neoplasm of unspecified adrenal gland: Secondary | ICD-10-CM | POA: Diagnosis present

## 2015-06-09 DIAGNOSIS — Z9221 Personal history of antineoplastic chemotherapy: Secondary | ICD-10-CM

## 2015-06-09 DIAGNOSIS — E876 Hypokalemia: Secondary | ICD-10-CM | POA: Diagnosis present

## 2015-06-09 DIAGNOSIS — Z87891 Personal history of nicotine dependence: Secondary | ICD-10-CM

## 2015-06-09 DIAGNOSIS — I481 Persistent atrial fibrillation: Secondary | ICD-10-CM | POA: Diagnosis present

## 2015-06-09 DIAGNOSIS — K8000 Calculus of gallbladder with acute cholecystitis without obstruction: Secondary | ICD-10-CM | POA: Diagnosis not present

## 2015-06-09 DIAGNOSIS — R918 Other nonspecific abnormal finding of lung field: Secondary | ICD-10-CM | POA: Diagnosis not present

## 2015-06-09 DIAGNOSIS — Z8249 Family history of ischemic heart disease and other diseases of the circulatory system: Secondary | ICD-10-CM

## 2015-06-09 DIAGNOSIS — Z7901 Long term (current) use of anticoagulants: Secondary | ICD-10-CM

## 2015-06-09 DIAGNOSIS — K8020 Calculus of gallbladder without cholecystitis without obstruction: Secondary | ICD-10-CM | POA: Diagnosis not present

## 2015-06-09 DIAGNOSIS — N400 Enlarged prostate without lower urinary tract symptoms: Secondary | ICD-10-CM | POA: Diagnosis present

## 2015-06-09 DIAGNOSIS — C3431 Malignant neoplasm of lower lobe, right bronchus or lung: Secondary | ICD-10-CM

## 2015-06-09 DIAGNOSIS — K769 Liver disease, unspecified: Secondary | ICD-10-CM | POA: Diagnosis not present

## 2015-06-09 DIAGNOSIS — F1021 Alcohol dependence, in remission: Secondary | ICD-10-CM | POA: Diagnosis present

## 2015-06-09 DIAGNOSIS — E222 Syndrome of inappropriate secretion of antidiuretic hormone: Secondary | ICD-10-CM | POA: Diagnosis present

## 2015-06-09 DIAGNOSIS — Z801 Family history of malignant neoplasm of trachea, bronchus and lung: Secondary | ICD-10-CM

## 2015-06-09 MED ORDER — IOHEXOL 300 MG/ML  SOLN
100.0000 mL | Freq: Once | INTRAMUSCULAR | Status: AC | PRN
  Administered 2015-06-09: 100 mL via INTRAVENOUS

## 2015-06-09 NOTE — Telephone Encounter (Signed)
TC from [facility] Radiology. Results of today's CT scan are now available in [facility]

## 2015-06-10 ENCOUNTER — Telehealth: Payer: Self-pay | Admitting: Internal Medicine

## 2015-06-10 ENCOUNTER — Ambulatory Visit (HOSPITAL_BASED_OUTPATIENT_CLINIC_OR_DEPARTMENT_OTHER): Payer: Medicare Other | Admitting: Internal Medicine

## 2015-06-10 ENCOUNTER — Encounter: Payer: Self-pay | Admitting: Nutrition

## 2015-06-10 ENCOUNTER — Other Ambulatory Visit: Payer: Self-pay | Admitting: Internal Medicine

## 2015-06-10 ENCOUNTER — Other Ambulatory Visit (HOSPITAL_BASED_OUTPATIENT_CLINIC_OR_DEPARTMENT_OTHER): Payer: Medicare Other

## 2015-06-10 ENCOUNTER — Encounter: Payer: Self-pay | Admitting: Internal Medicine

## 2015-06-10 ENCOUNTER — Ambulatory Visit (HOSPITAL_BASED_OUTPATIENT_CLINIC_OR_DEPARTMENT_OTHER): Payer: Medicare Other

## 2015-06-10 VITALS — BP 121/51 | HR 69 | Temp 98.4°F | Resp 18 | Ht 72.0 in | Wt 155.3 lb

## 2015-06-10 DIAGNOSIS — N62 Hypertrophy of breast: Secondary | ICD-10-CM

## 2015-06-10 DIAGNOSIS — C7972 Secondary malignant neoplasm of left adrenal gland: Secondary | ICD-10-CM | POA: Diagnosis not present

## 2015-06-10 DIAGNOSIS — Z5112 Encounter for antineoplastic immunotherapy: Secondary | ICD-10-CM

## 2015-06-10 DIAGNOSIS — C3410 Malignant neoplasm of upper lobe, unspecified bronchus or lung: Secondary | ICD-10-CM

## 2015-06-10 DIAGNOSIS — C3431 Malignant neoplasm of lower lobe, right bronchus or lung: Secondary | ICD-10-CM

## 2015-06-10 DIAGNOSIS — R530 Neoplastic (malignant) related fatigue: Secondary | ICD-10-CM

## 2015-06-10 DIAGNOSIS — K810 Acute cholecystitis: Secondary | ICD-10-CM

## 2015-06-10 DIAGNOSIS — C3490 Malignant neoplasm of unspecified part of unspecified bronchus or lung: Secondary | ICD-10-CM

## 2015-06-10 LAB — CBC WITH DIFFERENTIAL/PLATELET
BASO%: 0.4 % (ref 0.0–2.0)
Basophils Absolute: 0 10*3/uL (ref 0.0–0.1)
EOS ABS: 0.2 10*3/uL (ref 0.0–0.5)
EOS%: 2.3 % (ref 0.0–7.0)
HCT: 38.3 % — ABNORMAL LOW (ref 38.4–49.9)
HGB: 13.3 g/dL (ref 13.0–17.1)
LYMPH%: 6.5 % — ABNORMAL LOW (ref 14.0–49.0)
MCH: 32.5 pg (ref 27.2–33.4)
MCHC: 34.8 g/dL (ref 32.0–36.0)
MCV: 93.6 fL (ref 79.3–98.0)
MONO#: 0.8 10*3/uL (ref 0.1–0.9)
MONO%: 10.5 % (ref 0.0–14.0)
NEUT%: 80.3 % — ABNORMAL HIGH (ref 39.0–75.0)
NEUTROS ABS: 6 10*3/uL (ref 1.5–6.5)
Platelets: 316 10*3/uL (ref 140–400)
RBC: 4.09 10*6/uL — AB (ref 4.20–5.82)
RDW: 13.9 % (ref 11.0–14.6)
WBC: 7.5 10*3/uL (ref 4.0–10.3)
lymph#: 0.5 10*3/uL — ABNORMAL LOW (ref 0.9–3.3)

## 2015-06-10 LAB — COMPREHENSIVE METABOLIC PANEL (CC13)
ALBUMIN: 3.4 g/dL — AB (ref 3.5–5.0)
ALT: 16 U/L (ref 0–55)
ANION GAP: 11 meq/L (ref 3–11)
AST: 18 U/L (ref 5–34)
Alkaline Phosphatase: 94 U/L (ref 40–150)
BUN: 12.9 mg/dL (ref 7.0–26.0)
CALCIUM: 9.2 mg/dL (ref 8.4–10.4)
CHLORIDE: 89 meq/L — AB (ref 98–109)
CO2: 27 mEq/L (ref 22–29)
Creatinine: 0.8 mg/dL (ref 0.7–1.3)
EGFR: 89 mL/min/{1.73_m2} — AB (ref >90)
GLUCOSE: 117 mg/dL (ref 70–140)
Potassium: 3.3 mEq/L — ABNORMAL LOW (ref 3.5–5.1)
SODIUM: 128 meq/L — AB (ref 136–145)
TOTAL PROTEIN: 6.5 g/dL (ref 6.4–8.3)
Total Bilirubin: 0.74 mg/dL (ref 0.20–1.20)

## 2015-06-10 MED ORDER — SODIUM CHLORIDE 0.9 % IV SOLN
3.0000 mg/kg | Freq: Once | INTRAVENOUS | Status: AC
  Administered 2015-06-10: 220 mg via INTRAVENOUS
  Filled 2015-06-10: qty 22

## 2015-06-10 MED ORDER — SODIUM CHLORIDE 0.9 % IV SOLN
250.0000 mL | Freq: Once | INTRAVENOUS | Status: AC
  Administered 2015-06-10: 250 mL via INTRAVENOUS

## 2015-06-10 NOTE — Telephone Encounter (Signed)
Printed for [employer]....she said everything is completed and she has sent msg to [facility]

## 2015-06-10 NOTE — Patient Instructions (Signed)
[facility] Discharge Instructions for Patients Receiving Chemotherapy  Today you received the following chemotherapy agents: nivolumab  To help prevent nausea and vomiting after your treatment, we encourage you to take your nausea medication.  Take it as often as prescribed.     If you develop nausea and vomiting that is not controlled by your nausea medication, call the clinic. If it is after clinic hours your family physician or the after hours number for the clinic or go to the Emergency Department.   BELOW ARE SYMPTOMS THAT SHOULD BE REPORTED IMMEDIATELY:  *FEVER GREATER THAN 100.5 F  *CHILLS WITH OR WITHOUT FEVER  NAUSEA AND VOMITING THAT IS NOT CONTROLLED WITH YOUR NAUSEA MEDICATION  *UNUSUAL SHORTNESS OF BREATH  *UNUSUAL BRUISING OR BLEEDING  TENDERNESS IN MOUTH AND THROAT WITH OR WITHOUT PRESENCE OF ULCERS  *URINARY PROBLEMS  *BOWEL PROBLEMS  UNUSUAL RASH Items with * indicate a potential emergency and should be followed up as soon as possible.  Feel free to call the clinic you have any questions or concerns. The clinic phone number is [phone].   I have been informed and understand all the instructions given to me. I know to contact the clinic, my physician, or go to the Emergency Department if any problems should occur. I do not have any questions at this time, but understand that I may call the clinic during office hours   should I have any questions or need assistance in obtaining follow up care.    __________________________________________  _____________  __________ Signature of Patient or Authorized Representative            Date                   Time    __________________________________________ Nurse's Signature    

## 2015-06-10 NOTE — Telephone Encounter (Signed)
Faxed pt medical records to [facility]

## 2015-06-10 NOTE — Progress Notes (Signed)
Patient's wife is requesting samples of Ensure Plus. Patient has previously received 3 complementary cases of Ensure Plus. Will provide samples along with coupons for patient to purchase Ensure Plus as needed.

## 2015-06-10 NOTE — Telephone Encounter (Signed)
Pt confirmed labs/ov per 07/06 POF, gave pt AVS and Calendar.... KJ, sent msg to add chemo and sent msg to set up referral to Medical Records for [facility]

## 2015-06-10 NOTE — Progress Notes (Signed)
Anthony [phone]) [phone]   Fax:([phone]) Anthony Anthony Curry [phone]  DIAGNOSIS: Stage IV (T1 A., Anthony Anthony Curry, Anthony Anthony Curry, Anthony Anthony Curry, Anthony Anthony Curry, Anthony Anthony Curry, Anthony Anthony Curry, Anthony Anthony Curry ERBB2 [id number], [id number], KRAS G13E, MAP2K1 D67N (see full report).   PRIOR THERAPY:  1) Systemic chemotherapy with carboplatin Anthony Curry AUC of 5 and Alimta 500 mg/M2 every 3 weeks, status post 6 cycles with stable disease. First dose on 10/23/2013. 2) Maintenance systemic chemotherapy with single agent Alimta 500 mg/M2 every 3 weeks. Status post 8 cycles.   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 Sondra Come completed on 12/16/2014.  CURRENT THERAPY: Immunotherapy with Nivolumab 3 MG/KG every 2 weeks, status post 8 cycles. First cycle 01/28/2015  CHEMOTHERAPY INTENT: Palliative  CURRENT # OF CHEMOTHERAPY CYCLES: 9 CURRENT ANTIEMETICS: Zofran, Compazine and dexamethasone  CURRENT SMOKING STATUS: Former smoker  ORAL CHEMOTHERAPY AND CONSENT: None  CURRENT BISPHOSPHONATES USE: None  PAIN MANAGEMENT: 0/10  NARCOTICS INDUCED CONSTIPATION: None  LIVING WILL AND CODE STATUS: ?   INTERVAL HISTORY: Maximum Reiland 74 y.o. male returns to the clinic today Anthony Curry followup visit accompanied by his wife. He is tolerating his current treatment with immunotherapy with Nivolumab fairly well with no significant adverse effects except Anthony Curry mild fatigue. Has been complaining of heartburn's as well as pain in the right upper quadrant Anthony Curry the last few days. He was seen at the emergency department and was getting pain medication but still complaining of intermittent pain in the right upper quadrant. He denied having any significant chest pain, shortness  of breath, cough or hemoptysis. He denied having any significant fever or chills, no nausea or vomiting. He had repeat CT scan of the chest, abdomen and pelvis performed yesterday and he is here Anthony Curry evaluation and discussion of his scan results.  MEDICAL HISTORY: Past Medical History  Diagnosis Date  . Hypertension   . GERD (gastroesophageal reflux disease)   . Alcohol abuse   . 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 Low sodium   . Persistent atrial fibrillation 04/11/14    chads2vasc score is 2  . Hypotension   . Atrial fibrillation with RVR   . HCAP (healthcare-associated pneumonia)   . Adrenal hemorrhage   . BPH (benign prostatic hyperplasia)   . Hyponatremia   . Anemia of chronic disease   . Fall   . Alcohol intoxication   . Pulmonary nodules   . Adrenal mass   . Atrial flutter by electrocardiogram   . 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 Alcoholism in remission   . Radiation 12/02/14-12/16/14    Left adrenal gland 30 Gy in 10 fractions  . Anthony Curry     small cell/lung  . Neoplastic malignant related fatigue     ALLERGIES:  has No Known Allergies.  MEDICATIONS:  Current Outpatient Prescriptions  Medication Sig Dispense Refill  . ALPRAZolam (XANAX) 0.25 MG tablet Take 0.25 mg by mouth 3 (three) times daily as needed Anthony Curry anxiety or sleep.    [name] apixaban (ELIQUIS) 5 MG TABS tablet Take 1 tablet (5 mg total) by mouth 2 (two) times daily. 180 tablet 0  . Ascorbic Acid (VITAMIN C PO) Take 1,000 mg by mouth daily.     [name]  B Complex-C (SUPER B COMPLEX PO) Take 1 each by mouth daily.     . Biotin 7500 MCG TABS Take 1 tablet by mouth daily.     . calcium carbonate (OS-CAL) 600 MG TABS Take 600 mg by mouth daily with breakfast.    . demeclocycline (DECLOMYCIN) 150 MG tablet Take 150 mg by mouth 2 (two) times daily.    [name] dexamethasone (DECADRON) 4 MG tablet Take 4 mg by mouth See admin instructions. Take 1 tablet twice a day the day before, day of, and day after  chemotherapy every three weeks.    [name] diltiazem (CARDIZEM CD) 180 MG 24 hr capsule Take 1 capsule (180 mg total) by mouth daily at 12 noon. 90 capsule 3  . furosemide (LASIX) 40 MG tablet Take 1 tablet (40 mg total) by mouth daily. 90 tablet 3  . HYDROcodone-acetaminophen (NORCO/VICODIN) 5-325 MG per tablet Take 1 tablet by mouth every 6 (six) hours as needed. 15 tablet 0  . KLOR-CON M20 20 MEQ tablet Take 20 mEq by mouth daily.     . Lactobacillus (ACIDOPHILUS) CAPS capsule Take 1 capsule by mouth daily.    [name] MAGNESIUM PO Take 400 mg by mouth daily.     . metoprolol tartrate (LOPRESSOR) 25 MG tablet Take 0.5 tablets (12.5 mg total) by mouth 2 (two) times daily. 180 tablet 1  . mirtazapine (REMERON) 15 MG tablet Take 1 tablet (15 mg total) by mouth at bedtime. 30 tablet 0  . Multiple Vitamin (MULTIVITAMIN WITH MINERALS) TABS Take 1 tablet by mouth daily.    . Nivolumab (OPDIVO IV) Inject as directed every 2 months    . Nutritional Supplements (ENSURE COMPLETE PO) Take 1 Can by mouth 2 (two) times daily.    . Omega-3 Fatty Acids (FISH OIL PO) Take 1 tablet by mouth daily.    [name] omeprazole (PRILOSEC) 20 MG capsule Take 20 mg by mouth daily as needed (heartburn).     . ondansetron (ZOFRAN ODT) 8 MG disintegrating tablet Take 1 tablet (8 mg total) by mouth every 8 (eight) hours as needed Anthony Curry nausea. 20 tablet 0  . sucralfate (CARAFATE) 1 G tablet Take 1 tablet (1 g total) by mouth 4 (four) times daily -  with meals and at bedtime. 30 tablet 0  . tamsulosin (FLOMAX) 0.4 MG CAPS capsule Take 0.4 mg by mouth daily.     [name] thiamine (VITAMIN B-1) 100 MG tablet Take 1 tablet (100 mg total) by mouth daily. 30 tablet 6  . vitamin E 400 UNIT capsule Take 400 Units by mouth daily.     No current facility-administered medications Anthony Curry this visit.    SURGICAL HISTORY:  Past Surgical History  Procedure Laterality Date  . Hemorrhoid surgery    . Ankle arthrodesis  1995    rt fx-hardware in  . Tonsillectomy      . Colonoscopy    . Excision / curettage bone cyst finger  2005    rt thumb  . Total shoulder arthroplasty  02/03/2012    Procedure: TOTAL SHOULDER ARTHROPLASTY;  Surgeon: [name], Anthony;  Location: [place];  Service: Orthopedics;  Laterality: Left;  . Cardioversion N/A 09/29/2014    Procedure: CARDIOVERSION;  Surgeon: Josue Hector, Anthony;  Location: [facility] ENDOSCOPY;  Service: Cardiovascular;  Laterality: N/A;    REVIEW OF SYSTEMS:  Constitutional: positive Anthony Curry fatigue Eyes: negative Ears, nose, mouth, throat, and face: negative Respiratory: negative Cardiovascular: negative Gastrointestinal: positive Anthony Curry abdominal pain and  dyspepsia Genitourinary:negative Integument/breast: negative Hematologic/lymphatic: negative Musculoskeletal:negative Neurological: negative Behavioral/Psych: negative Endocrine: negative Allergic/Immunologic: negative   PHYSICAL EXAMINATION: General appearance: alert, cooperative, fatigued and no distress Head: Normocephalic, without obvious abnormality, atraumatic Neck: no adenopathy, no JVD, supple, symmetrical, trachea midline and thyroid not enlarged, symmetric, no tenderness/mass/nodules Lymph nodes: Cervical, supraclavicular, and axillary nodes normal. Resp: clear to auscultation bilaterally Back: symmetric, no curvature. ROM normal. No CVA tenderness. Cardio: regular rate and rhythm, S1, S2 normal, no murmur, click, rub or gallop GI: Right upper quadrant tenderness to palpation Extremities: extremities normal, atraumatic, no cyanosis or edema Neurologic: Alert and oriented X 3, normal strength and tone. Normal symmetric reflexes. Normal coordination and gait  ECOG PERFORMANCE STATUS: 1 - Symptomatic but completely ambulatory  Blood pressure 121/51, pulse 69, temperature 98.4 F (36.9 C), temperature source Oral, resp. rate 18, height 6' (1.829 m), weight 155 lb 4.8 oz (70.444 kg), SpO2 96 %.  LABORATORY DATA: Lab Results   Component Value Date   WBC 7.5 06/10/2015   HGB 13.3 06/10/2015   HCT 38.3* 06/10/2015   MCV 93.6 06/10/2015   PLT 316 06/10/2015      Chemistry      Component Value Date/Time   NA 128* 06/10/2015 1241   NA 128* 06/01/2015 0149   K 3.3* 06/10/2015 1241   K 3.3* 06/01/2015 0149   CL 94* 06/01/2015 0149   CO2 27 06/10/2015 1241   CO2 22 06/01/2015 0149   BUN 12.9 06/10/2015 1241   BUN 12 06/01/2015 0149   CREATININE 0.8 06/10/2015 1241   CREATININE 0.69 06/01/2015 0149      Component Value Date/Time   CALCIUM 9.2 06/10/2015 1241   CALCIUM 9.3 06/01/2015 0149   ALKPHOS 94 06/10/2015 1241   ALKPHOS 100 09/02/2014 1400   AST 18 06/10/2015 1241   AST 25 09/02/2014 1400   ALT 16 06/10/2015 1241   ALT 15 09/02/2014 1400   BILITOT 0.74 06/10/2015 1241   BILITOT 0.8 09/02/2014 1400       RADIOGRAPHIC STUDIES: Dg Chest 2 View  06/01/2015   CLINICAL DATA:  Atrial fibrillation.  Hypotension.  EXAM: CHEST  2 VIEW  COMPARISON:  04/10/2015  FINDINGS: There is moderate hyperinflation. There are mild linear opacities bilaterally which are not significantly changed and this may represent scarring. There is no confluent airspace opacity. There is no effusion. Heart size is normal. Pulmonary vasculature is normal.  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  IMPRESSION: Bilateral linear central and basilar opacities, likely scarring. Multiple ununited right rib fractures. No acute cardiopulmonary findings.   Electronically Signed   By: Andreas Newport M.D.   On: 06/01/2015 02:55   Ct Chest W Contrast  06/09/2015   CLINICAL DATA:  Subsequent evaluation of the a 74 year old male with history of lung Anthony Curry diagnosed in November 2015. Right-sided chest pain, and pain during deep inspiration Anthony Curry 1 week. Chemotherapy and radiation therapy now complete. Oral chemotherapy ongoing. Prior  smoker.  EXAM: CT CHEST, ABDOMEN, AND PELVIS WITH CONTRAST  TECHNIQUE: Multidetector CT imaging of the chest, abdomen and pelvis was performed following the standard protocol during bolus administration of intravenous contrast.  CONTRAST:  111m OMNIPAQUE IOHEXOL 300 MG/ML  SOLN  COMPARISON:  Chest CT 06/01/2015. CT of the chest, abdomen and pelvis 04/10/2015.  FINDINGS: CT CHEST FINDINGS  Mediastinum/Lymph Nodes: Heart size is normal. Small amount of anterior pericardial fluid and/or  thickening, similar to prior examinations, and the unlikely to be of any hemodynamic significance at this time. No associated pericardial calcification. There is atherosclerosis of the thoracic aorta, the great vessels of the mediastinum and the coronary arteries, including calcified atherosclerotic plaque in the left main, left anterior descending and right coronary arteries. No pathologically enlarged mediastinal or hilar lymph nodes. Esophagus is unremarkable in appearance. No axillary lymphadenopathy.  Lungs/Pleura: Previously noted nodule in the medial aspect of the apex of the left upper lobe (image 9 of series 4) is stable to slightly decreased in size compared to the prior examination, currently measuring 13 x 7 mm. The other previously described 8 mm spiculated nodule in the left upper lobe is now a very ill-defined area of ground-glass attenuation (image 19 of series 4). Several other scattered areas of ground-glass attenuation are also noted in the upper lobes of the lungs bilaterally, which appear similar to the prior examination. Linear scarring in the left lower lobe is unchanged. New linear opacities and architectural distortion in the right lower lobe may reflect new areas of developing scarring, or may simply reflect subsegmental atelectasis. Mild diffuse bronchial wall thickening with mild centrilobular and paraseptal emphysema. No acute consolidative airspace disease. No pleural effusions.  Musculoskeletal/Soft  Tissues: Asymmetric right-sided gynecomastia again noted, increased in size measuring up to 3.2 x 1.3 cm on today's examination (previously 2.5 x 1.2 cm on prior study 11/10/2014), and completely new compared to remote prior PET-CT 08/27/2013. Multiple old healed right-sided rib fractures. Status post left shoulder hemiarthroplasty. Chronic posttraumatic degenerative changes in the right shoulder redemonstrated. There are no aggressive appearing lytic or blastic lesions noted in the visualized portions of the skeleton.  CT ABDOMEN AND PELVIS FINDINGS  Hepatobiliary: Several tiny sub cm low-attenuation liver lesions and sub cm hypervascular lesions are unchanged in size, number and distribution in the liver, likely to reflect a combination of small cysts and flash fill cavernous hemangiomas. No new aggressive appearing liver lesions are noted. No intra or extrahepatic biliary ductal dilatation. However, there does appear to be a 4 mm partially calcified stone in the distal common bile duct (image 71 of series 2). There are several partially calcified gallstones lying dependently in the gallbladder, and the gallbladder now appears moderately distended, with a thickened wall which measures up to 9 mm, with small amount of pericholecystic fluid and extensive pericholecystic stranding, strongly suggestive of acute cholecystitis at this time.  Pancreas: No pancreatic mass. No pancreatic ductal dilatation. No pancreatic or peripancreatic fluid or inflammatory changes.  Spleen: Unremarkable.  Adrenals/Urinary Tract: Bilateral perinephric stranding (nonspecific). Multifocal cortical thinning in the kidneys bilaterally, presumably chronic post infectious or inflammatory scarring. Sub cm low-attenuation lesions in the kidneys bilaterally are too small to definitively characterize, but are similar to prior studies, presumably small cysts. Mild bilateral adrenal nodularity, now difficult to accurately measure, but smaller than  the prior examinations. No hydroureteronephrosis. Urinary bladder is remarkable Anthony Curry multiple small bladder wall diverticulae.  Stomach/Bowel: Normal appearance of the stomach. No pathologic dilatation of small bowel or colon. A few scattered colonic diverticulae are noted, without surrounding inflammatory changes to suggest an acute diverticulitis at this time.  Vascular/Lymphatic: Extensive atherosclerosis throughout the abdominal and pelvic vasculature, without evidence of aneurysm or dissection.  Reproductive: Prostate gland and seminal vesicles are unremarkable in appearance.  Other: Trace volume of ascites.  No pneumoperitoneum.  Musculoskeletal: There are no aggressive appearing lytic or blastic lesions noted in the visualized portions of the skeleton. Extensive multilevel  degenerative disc disease and spondylosis throughout the lumbar spine.  IMPRESSION: 1. Findings are highly concerning Anthony Curry acute cholecystitis. This could be confirmed with right upper quadrant ultrasound if clinically appropriate. Surgical consultation is also suggested. 2. Generally stable (or slightly improved) appearance of multiple pulmonary nodules (most of which are ground-glass attenuation) in the lungs bilaterally, as above. Continued attention on followup studies is recommended to ensure continued stability. 3. Slow progressive enlargement of asymmetric gynecomastia. This could represent a soft tissue metastasis, or primary male breast Anthony Curry, and further evaluation with breast ultrasound and/or mammography is suggested if clinically appropriate. 4. Additional incidental findings, as above, generally similar prior examinations. These results will be called to the ordering clinician or representative by the Radiologist Assistant, and communication documented in the PACS or zVision Dashboard.   Electronically Signed   By: Vinnie Langton M.D.   On: 06/09/2015 15:45   Ct Angio Chest Pe W/cm &/or Wo Cm  06/01/2015   CLINICAL DATA:   Midsternal chest pain, onset yesterday.  EXAM: CT ANGIOGRAPHY CHEST WITH CONTRAST  TECHNIQUE: Multidetector CT imaging of the chest was performed using the standard protocol during bolus administration of intravenous contrast. Multiplanar CT image reconstructions and MIPs were obtained to evaluate the vascular anatomy.  CONTRAST:  15mL OMNIPAQUE IOHEXOL 350 MG/ML SOLN  COMPARISON:  04/10/2015  FINDINGS: Cardiovascular: There is good opacification of the pulmonary arteries. There is no pulmonary embolism. The thoracic aorta is normal in caliber and intact.  Lungs: Unchanged 5 x 14 mm medial left apical spiculated nodule. Unchanged smaller nodules elsewhere in the left upper lobe. New or worsened ground-glass opacities in both upper lobes. Underlying emphysematous changes.  Central airways: Patent. Small amount of retained secretions in the trachea.  Effusions: None  Lymphadenopathy: None  Esophagus: Unremarkable  Upper abdomen: Unremarkable  Musculoskeletal: Asymmetric right gynecomastia, unchanged  Review of the MIP images confirms the above findings.  IMPRESSION: 1. Negative Anthony Curry pulmonary embolism 2. New or worsened mild ground-glass opacities in both upper lobes, superimposed on extensive emphysematous changes. This could represent infectious infiltrate. Alveolar edema is a possibility. Neoplastic ground-glass opacities are less likely. Unchanged spiculated medial left apical nodule as well as smaller upper lobe nodules. 3. Small amount of retained secretions in the trachea.   Electronically Signed   By: Andreas Newport M.D.   On: 06/01/2015 04:35   [name] PF Contrast  05/13/2015   CLINICAL DATA:  Lung Anthony Curry with dizziness. Evaluate Anthony Curry metastases.  EXAM: MRI HEAD WITHOUT AND WITH CONTRAST  TECHNIQUE: Multiplanar, multiecho pulse sequences of the brain and surrounding structures were obtained without and with intravenous contrast.  CONTRAST:  26mL MULTIHANCE GADOBENATE DIMEGLUMINE 529 MG/ML IV SOLN   COMPARISON:  Head CT 09/02/2014  FINDINGS: Calvarium and upper cervical spine: No focal marrow signal abnormality.  Orbits: Bilateral cataract resection.  No orbital mass.  Sinuses and Mastoids: Minor mucosal thickening or fluid in the right mastoid tip. This would not explain dizziness. Mild mucosal thickening in the inferior right maxillary antrum.  Brain: No abnormal intracranial enhancement to suggest metastatic disease. No acute abnormality such as acute infarct, hemorrhage, hydrocephalus, or mass effect. No evidence of large vessel occlusion. Generalized atrophy with ventriculomegaly, stable from 2015. Chronic small-vessel disease is moderate with ischemic gliosis patchy throughout the bilateral cerebral white matter, confluent around the lateral ventricles.  IMPRESSION: 1. No evidence of intracranial metastatic disease. No explanation Anthony Curry recent onset dizziness. 2. 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   Electronically  Signed   By: Monte Fantasia M.D.   On: 05/13/2015 08:15   Ct Abdomen Pelvis W Contrast  06/09/2015   CLINICAL DATA:  Subsequent evaluation of the a 74 year old male with history of lung Anthony Curry diagnosed in November 2015. Right-sided chest pain, and pain during deep inspiration Anthony Curry 1 week. Chemotherapy and radiation therapy now complete. Oral chemotherapy ongoing. Prior smoker.  EXAM: CT CHEST, ABDOMEN, AND PELVIS WITH CONTRAST  TECHNIQUE: Multidetector CT imaging of the chest, abdomen and pelvis was performed following the standard protocol during bolus administration of intravenous contrast.  CONTRAST:  138m OMNIPAQUE IOHEXOL 300 MG/ML  SOLN  COMPARISON:  Chest CT 06/01/2015. CT of the chest, abdomen and pelvis 04/10/2015.  FINDINGS: CT CHEST FINDINGS  Mediastinum/Lymph Nodes: Heart size is normal. Small amount of anterior pericardial fluid and/or thickening, similar to prior examinations, and the unlikely to be of any hemodynamic significance at this time. No associated  pericardial calcification. There is atherosclerosis of the thoracic aorta, the great vessels of the mediastinum and the coronary arteries, including calcified atherosclerotic plaque in the left main, left anterior descending and right coronary arteries. No pathologically enlarged mediastinal or hilar lymph nodes. Esophagus is unremarkable in appearance. No axillary lymphadenopathy.  Lungs/Pleura: Previously noted nodule in the medial aspect of the apex of the left upper lobe (image 9 of series 4) is stable to slightly decreased in size compared to the prior examination, currently measuring 13 x 7 mm. The other previously described 8 mm spiculated nodule in the left upper lobe is now a very ill-defined area of ground-glass attenuation (image 19 of series 4). Several other scattered areas of ground-glass attenuation are also noted in the upper lobes of the lungs bilaterally, which appear similar to the prior examination. Linear scarring in the left lower lobe is unchanged. New linear opacities and architectural distortion in the right lower lobe may reflect new areas of developing scarring, or may simply reflect subsegmental atelectasis. Mild diffuse bronchial wall thickening with mild centrilobular and paraseptal emphysema. No acute consolidative airspace disease. No pleural effusions.  Musculoskeletal/Soft Tissues: Asymmetric right-sided gynecomastia again noted, increased in size measuring up to 3.2 x 1.3 cm on today's examination (previously 2.5 x 1.2 cm on prior study 11/10/2014), and completely new compared to remote prior PET-CT 08/27/2013. Multiple old healed right-sided rib fractures. Status post left shoulder hemiarthroplasty. Chronic posttraumatic degenerative changes in the right shoulder redemonstrated. There are no aggressive appearing lytic or blastic lesions noted in the visualized portions of the skeleton.  CT ABDOMEN AND PELVIS FINDINGS  Hepatobiliary: Several tiny sub cm low-attenuation liver  lesions and sub cm hypervascular lesions are unchanged in size, number and distribution in the liver, likely to reflect a combination of small cysts and flash fill cavernous hemangiomas. No new aggressive appearing liver lesions are noted. No intra or extrahepatic biliary ductal dilatation. However, there does appear to be a 4 mm partially calcified stone in the distal common bile duct (image 71 of series 2). There are several partially calcified gallstones lying dependently in the gallbladder, and the gallbladder now appears moderately distended, with a thickened wall which measures up to 9 mm, with small amount of pericholecystic fluid and extensive pericholecystic stranding, strongly suggestive of acute cholecystitis at this time.  Pancreas: No pancreatic mass. No pancreatic ductal dilatation. No pancreatic or peripancreatic fluid or inflammatory changes.  Spleen: Unremarkable.  Adrenals/Urinary Tract: Bilateral perinephric stranding (nonspecific). Multifocal cortical thinning in the kidneys bilaterally, presumably chronic post infectious or inflammatory scarring. Sub  cm low-attenuation lesions in the kidneys bilaterally are too small to definitively characterize, but are similar to prior studies, presumably small cysts. Mild bilateral adrenal nodularity, now difficult to accurately measure, but smaller than the prior examinations. No hydroureteronephrosis. Urinary bladder is remarkable Anthony Curry multiple small bladder wall diverticulae.  Stomach/Bowel: Normal appearance of the stomach. No pathologic dilatation of small bowel or colon. A few scattered colonic diverticulae are noted, without surrounding inflammatory changes to suggest an acute diverticulitis at this time.  Vascular/Lymphatic: Extensive atherosclerosis throughout the abdominal and pelvic vasculature, without evidence of aneurysm or dissection.  Reproductive: Prostate gland and seminal vesicles are unremarkable in appearance.  Other: Trace volume of  ascites.  No pneumoperitoneum.  Musculoskeletal: There are no aggressive appearing lytic or blastic lesions noted in the visualized portions of the skeleton. Extensive multilevel degenerative disc disease and spondylosis throughout the lumbar spine.  IMPRESSION: 1. Findings are highly concerning Anthony Curry acute cholecystitis. This could be confirmed with right upper quadrant ultrasound if clinically appropriate. Surgical consultation is also suggested. 2. Generally stable (or slightly improved) appearance of multiple pulmonary nodules (most of which are ground-glass attenuation) in the lungs bilaterally, as above. Continued attention on followup studies is recommended to ensure continued stability. 3. Slow progressive enlargement of asymmetric gynecomastia. This could represent a soft tissue metastasis, or primary male breast Anthony Curry, and further evaluation with breast ultrasound and/or mammography is suggested if clinically appropriate. 4. Additional incidental findings, as above, generally similar prior examinations. These results will be called to the ordering clinician or representative by the Radiologist Assistant, and communication documented in the PACS or zVision Dashboard.   Electronically Signed   By: Vinnie Langton M.D.   On: 06/09/2015 15:45   ASSESSMENT AND PLAN: This is a very pleasant 74 years old white male with:  1)  Stage IV non-small cell lung Anthony Curry, Anthony status post induction chemotherapy with carboplatin and Alimta and currently on maintenance treatment with single agent Alimta status post 8 cycles. He has been observation Anthony Curry the last few months.  He had evidence Anthony Curry disease progression on the left adrenal gland and the patient underwent palliative radiotherapy to the left adrenal gland under the care of Dr. Sondra Come completed on 12/16/2014. He is currently undergoing treatment with immunotherapy with Nivolumab status post 8 cycles and tolerating his treatment fairly well. The  recent CT scan of the chest, abdomen and pelvis showed no evidence Anthony Curry disease progression. I discussed the scan results with the patient and recommended Anthony Curry him to continue his treatment with immunotherapy. He will start cycle #9 today. The patient would come back Anthony Curry follow-up visit in 2 weeks with the start of cycle #10.  2) questionable acute cholecystitis: Seen on the recent CT scan as well as the exam today. I would recommend Anthony Curry the patient to have ultrasound of the abdomen Anthony Curry further evaluation. I will also made an urgent referral to general surgery Anthony Curry evaluation of his condition and consideration of surgery if needed.  3) Atrial fibrillation: He'll continue his current medication with Cardizem, digoxin and metoprolol in addition to Eliquis. He'll also continue his routine followup visit with his cardiologist Dr. [name].  He was advised to call immediately if he has any concerning symptoms in the interval. The patient voices understanding of current disease status and treatment options and is in agreement with the current care plan.  All questions were answered. The patient knows to call the clinic with any problems, questions or concerns. We can certainly see the  patient much sooner if necessary.  Disclaimer: This note was dictated with voice recognition software. Similar sounding words can inadvertently be transcribed and may not be corrected upon review.

## 2015-06-11 ENCOUNTER — Inpatient Hospital Stay (HOSPITAL_COMMUNITY): Payer: Medicare Other

## 2015-06-11 ENCOUNTER — Emergency Department (HOSPITAL_COMMUNITY): Payer: Medicare Other

## 2015-06-11 ENCOUNTER — Inpatient Hospital Stay (HOSPITAL_COMMUNITY)
Admission: EM | Admit: 2015-06-11 | Discharge: 2015-06-15 | DRG: 409 | Disposition: A | Discharge status: Home Health | Payer: Medicare Other | Attending: Surgery | Admitting: Surgery

## 2015-06-11 ENCOUNTER — Encounter (HOSPITAL_COMMUNITY): Payer: Self-pay

## 2015-06-11 DIAGNOSIS — K8020 Calculus of gallbladder without cholecystitis without obstruction: Secondary | ICD-10-CM | POA: Diagnosis not present

## 2015-06-11 DIAGNOSIS — N400 Enlarged prostate without lower urinary tract symptoms: Secondary | ICD-10-CM | POA: Diagnosis not present

## 2015-06-11 DIAGNOSIS — Z8249 Family history of ischemic heart disease and other diseases of the circulatory system: Secondary | ICD-10-CM | POA: Diagnosis not present

## 2015-06-11 DIAGNOSIS — R1011 Right upper quadrant pain: Secondary | ICD-10-CM | POA: Diagnosis present

## 2015-06-11 DIAGNOSIS — Z801 Family history of malignant neoplasm of trachea, bronchus and lung: Secondary | ICD-10-CM | POA: Diagnosis not present

## 2015-06-11 DIAGNOSIS — K810 Acute cholecystitis: Secondary | ICD-10-CM | POA: Diagnosis not present

## 2015-06-11 DIAGNOSIS — I10 Essential (primary) hypertension: Secondary | ICD-10-CM | POA: Diagnosis not present

## 2015-06-11 DIAGNOSIS — C3490 Malignant neoplasm of unspecified part of unspecified bronchus or lung: Secondary | ICD-10-CM | POA: Diagnosis present

## 2015-06-11 DIAGNOSIS — I481 Persistent atrial fibrillation: Secondary | ICD-10-CM | POA: Diagnosis present

## 2015-06-11 DIAGNOSIS — I4891 Unspecified atrial fibrillation: Secondary | ICD-10-CM | POA: Diagnosis not present

## 2015-06-11 DIAGNOSIS — K8000 Calculus of gallbladder with acute cholecystitis without obstruction: Secondary | ICD-10-CM | POA: Diagnosis present

## 2015-06-11 DIAGNOSIS — Z923 Personal history of irradiation: Secondary | ICD-10-CM | POA: Diagnosis not present

## 2015-06-11 DIAGNOSIS — I119 Hypertensive heart disease without heart failure: Secondary | ICD-10-CM | POA: Diagnosis present

## 2015-06-11 DIAGNOSIS — K219 Gastro-esophageal reflux disease without esophagitis: Secondary | ICD-10-CM | POA: Diagnosis not present

## 2015-06-11 DIAGNOSIS — M19012 Primary osteoarthritis, left shoulder: Secondary | ICD-10-CM | POA: Diagnosis present

## 2015-06-11 DIAGNOSIS — K5900 Constipation, unspecified: Secondary | ICD-10-CM | POA: Diagnosis present

## 2015-06-11 DIAGNOSIS — Z7901 Long term (current) use of anticoagulants: Secondary | ICD-10-CM | POA: Diagnosis not present

## 2015-06-11 DIAGNOSIS — E876 Hypokalemia: Secondary | ICD-10-CM | POA: Diagnosis present

## 2015-06-11 DIAGNOSIS — Z833 Family history of diabetes mellitus: Secondary | ICD-10-CM | POA: Diagnosis not present

## 2015-06-11 DIAGNOSIS — F1021 Alcohol dependence, in remission: Secondary | ICD-10-CM | POA: Diagnosis present

## 2015-06-11 DIAGNOSIS — Z87891 Personal history of nicotine dependence: Secondary | ICD-10-CM | POA: Diagnosis not present

## 2015-06-11 DIAGNOSIS — C7970 Secondary malignant neoplasm of unspecified adrenal gland: Secondary | ICD-10-CM | POA: Diagnosis present

## 2015-06-11 DIAGNOSIS — E222 Syndrome of inappropriate secretion of antidiuretic hormone: Secondary | ICD-10-CM | POA: Diagnosis present

## 2015-06-11 LAB — CBC WITH DIFFERENTIAL/PLATELET
BASOS ABS: 0 10*3/uL (ref 0.0–0.1)
Basophils Relative: 0 % (ref 0–1)
EOS PCT: 5 % (ref 0–5)
Eosinophils Absolute: 0.3 10*3/uL (ref 0.0–0.7)
HEMATOCRIT: 35.7 % — AB (ref 39.0–52.0)
HEMOGLOBIN: 12.7 g/dL — AB (ref 13.0–17.0)
LYMPHS ABS: 0.9 10*3/uL (ref 0.7–4.0)
Lymphocytes Relative: 14 % (ref 12–46)
MCH: 32.1 pg (ref 26.0–34.0)
MCHC: 35.6 g/dL (ref 30.0–36.0)
MCV: 90.2 fL (ref 78.0–100.0)
MONO ABS: 0.9 10*3/uL (ref 0.1–1.0)
Monocytes Relative: 14 % — ABNORMAL HIGH (ref 3–12)
Neutro Abs: 4.2 10*3/uL (ref 1.7–7.7)
Neutrophils Relative %: 67 % (ref 43–77)
Platelets: 340 10*3/uL (ref 150–400)
RBC: 3.96 MIL/uL — AB (ref 4.22–5.81)
RDW: 13.3 % (ref 11.5–15.5)
WBC: 6.3 10*3/uL (ref 4.0–10.5)

## 2015-06-11 LAB — COMPREHENSIVE METABOLIC PANEL
ALK PHOS: 84 U/L (ref 38–126)
ALT: 17 U/L (ref 17–63)
AST: 24 U/L (ref 15–41)
Albumin: 3.7 g/dL (ref 3.5–5.0)
Anion gap: 10 (ref 5–15)
BUN: 12 mg/dL (ref 6–20)
CALCIUM: 8.7 mg/dL — AB (ref 8.9–10.3)
CHLORIDE: 88 mmol/L — AB (ref 101–111)
CO2: 30 mmol/L (ref 22–32)
Creatinine, Ser: 0.79 mg/dL (ref 0.61–1.24)
GLUCOSE: 108 mg/dL — AB (ref 65–99)
Potassium: 3.5 mmol/L (ref 3.5–5.1)
Sodium: 128 mmol/L — ABNORMAL LOW (ref 135–145)
Total Bilirubin: 1 mg/dL (ref 0.3–1.2)
Total Protein: 6.8 g/dL (ref 6.5–8.1)

## 2015-06-11 LAB — HEPARIN LEVEL (UNFRACTIONATED): Heparin Unfractionated: >2.2 IU/mL — ABNORMAL HIGH (ref 0.30–0.70)

## 2015-06-11 LAB — LIPASE, BLOOD: Lipase: <10 U/L — ABNORMAL LOW (ref 22–51)

## 2015-06-11 LAB — APTT: APTT: 45 s — AB (ref 24–37)

## 2015-06-11 MED ORDER — PANTOPRAZOLE SODIUM 40 MG PO TBEC
40.0000 mg | DELAYED_RELEASE_TABLET | Freq: Every morning | ORAL | Status: DC
  Administered 2015-06-12 – 2015-06-15 (×4): 40 mg via ORAL
  Filled 2015-06-11 (×5): qty 1

## 2015-06-11 MED ORDER — POTASSIUM CHLORIDE IN NACL 20-0.9 MEQ/L-% IV SOLN
INTRAVENOUS | Status: DC
  Administered 2015-06-11 – 2015-06-13 (×2): via INTRAVENOUS
  Filled 2015-06-11 (×6): qty 1000

## 2015-06-11 MED ORDER — FUROSEMIDE 40 MG PO TABS
40.0000 mg | ORAL_TABLET | Freq: Every day | ORAL | Status: DC
  Administered 2015-06-12 – 2015-06-15 (×4): 40 mg via ORAL
  Filled 2015-06-11 (×4): qty 1

## 2015-06-11 MED ORDER — PIPERACILLIN-TAZOBACTAM 3.375 G IVPB
3.3750 g | Freq: Three times a day (TID) | INTRAVENOUS | Status: DC
  Administered 2015-06-11 – 2015-06-15 (×12): 3.375 g via INTRAVENOUS
  Filled 2015-06-11 (×13): qty 50

## 2015-06-11 MED ORDER — DEMECLOCYCLINE HCL 150 MG PO TABS
150.0000 mg | ORAL_TABLET | Freq: Two times a day (BID) | ORAL | Status: DC
  Administered 2015-06-11 – 2015-06-15 (×8): 150 mg via ORAL
  Filled 2015-06-11 (×9): qty 1

## 2015-06-11 MED ORDER — TAMSULOSIN HCL 0.4 MG PO CAPS
0.4000 mg | ORAL_CAPSULE | Freq: Every day | ORAL | Status: DC
  Administered 2015-06-12 – 2015-06-15 (×4): 0.4 mg via ORAL
  Filled 2015-06-11 (×4): qty 1

## 2015-06-11 MED ORDER — SUCRALFATE 1 G PO TABS
1.0000 g | ORAL_TABLET | Freq: Three times a day (TID) | ORAL | Status: DC
  Administered 2015-06-11 – 2015-06-15 (×14): 1 g via ORAL
  Filled 2015-06-11 (×19): qty 1

## 2015-06-11 MED ORDER — ACETAMINOPHEN 650 MG RE SUPP: 650.0000 mg | Freq: Four times a day (QID) | RECTAL | Status: DC | PRN

## 2015-06-11 MED ORDER — ACETAMINOPHEN 325 MG PO TABS
650.0000 mg | ORAL_TABLET | Freq: Four times a day (QID) | ORAL | Status: DC | PRN
  Administered 2015-06-12: 650 mg via ORAL
  Filled 2015-06-11: qty 2

## 2015-06-11 MED ORDER — HEPARIN (PORCINE) IN NACL 100-0.45 UNIT/ML-% IJ SOLN
1000.0000 [IU]/h | INTRAMUSCULAR | Status: DC
  Administered 2015-06-11: 1000 [IU]/h via INTRAVENOUS
  Filled 2015-06-11 (×2): qty 250

## 2015-06-11 MED ORDER — SODIUM CHLORIDE 0.9 % IV SOLN
INTRAVENOUS | Status: DC
  Administered 2015-06-11: 75 mL/h via INTRAVENOUS

## 2015-06-11 MED ORDER — DILTIAZEM HCL ER COATED BEADS 180 MG PO CP24
180.0000 mg | ORAL_CAPSULE | Freq: Every day | ORAL | Status: DC
  Administered 2015-06-11 – 2015-06-15 (×5): 180 mg via ORAL
  Filled 2015-06-11 (×5): qty 1

## 2015-06-11 MED ORDER — MIRTAZAPINE 15 MG PO TABS
15.0000 mg | ORAL_TABLET | Freq: Every evening | ORAL | Status: DC | PRN
  Administered 2015-06-14: 15 mg via ORAL
  Filled 2015-06-11 (×3): qty 1

## 2015-06-11 MED ORDER — ONDANSETRON HCL 4 MG/2ML IJ SOLN: 4.0000 mg | Freq: Four times a day (QID) | INTRAMUSCULAR | Status: DC | PRN

## 2015-06-11 MED ORDER — POTASSIUM CHLORIDE CRYS ER 20 MEQ PO TBCR
20.0000 meq | EXTENDED_RELEASE_TABLET | Freq: Every day | ORAL | Status: DC
  Administered 2015-06-12 – 2015-06-15 (×4): 20 meq via ORAL
  Filled 2015-06-11 (×5): qty 1

## 2015-06-11 MED ORDER — ALPRAZOLAM 0.25 MG PO TABS
0.2500 mg | ORAL_TABLET | Freq: Three times a day (TID) | ORAL | Status: DC | PRN
  Administered 2015-06-11 – 2015-06-14 (×4): 0.25 mg via ORAL
  Filled 2015-06-11 (×4): qty 1

## 2015-06-11 MED ORDER — TECHNETIUM TC 99M MEBROFENIN IV KIT
5.1000 | PACK | Freq: Once | INTRAVENOUS | Status: AC | PRN
  Administered 2015-06-11: 5 via INTRAVENOUS

## 2015-06-11 MED ORDER — MORPHINE SULFATE 2 MG/ML IJ SOLN
2.0000 mg | INTRAMUSCULAR | Status: DC | PRN
  Administered 2015-06-11: 2.8 mg via INTRAVENOUS
  Administered 2015-06-12 – 2015-06-14 (×4): 2 mg via INTRAVENOUS
  Filled 2015-06-11 (×3): qty 1
  Filled 2015-06-11: qty 2
  Filled 2015-06-11 (×2): qty 1

## 2015-06-11 NOTE — ED Notes (Signed)
US at bedside

## 2015-06-11 NOTE — Progress Notes (Signed)
ANTICOAGULATION CONSULT NOTE - Initial Consult  Pharmacy Consult for heparin Indication: AFib  Allergies  Allergen Reactions  . Aspirin Other (See Comments)    Pt can not have Asprin due to medical conditions according to spouse    Patient Measurements:   Heparin Dosing Weight: 70.4kg  Vital Signs: Temp: 98.1 F (36.7 C) (07/07 1103) Temp Source: Oral (07/07 1103) BP: 131/63 mmHg (07/07 1114) Pulse Rate: 64 (07/07 1114)  Labs:  Recent Labs  06/10/15 1240 06/10/15 1241 06/11/15 1132  HGB 13.3  --  12.7*  HCT 38.3*  --  35.7*  PLT 316  --  340  CREATININE  --  0.8 0.79    Estimated Creatinine Clearance: 79.4 mL/min (by C-G formula based on Cr of 0.79).   Medical History: Past Medical History  Diagnosis Date  . Hypertension   . GERD (gastroesophageal reflux disease)   . Alcohol abuse   . 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 Low sodium   . Persistent atrial fibrillation 04/11/14    chads2vasc score is 2  . Hypotension   . Atrial fibrillation with RVR   . HCAP (healthcare-associated pneumonia)   . Adrenal hemorrhage   . BPH (benign prostatic hyperplasia)   . Hyponatremia   . Anemia of chronic disease   . Fall   . Alcohol intoxication   . Pulmonary nodules   . Adrenal mass   . Atrial flutter by electrocardiogram   . 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 Alcoholism in remission   . Radiation 12/02/14-12/16/14    Left adrenal gland 30 Gy in 10 fractions  . Cancer     small cell/lung  . 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 Location is RUQ/right flank.  CT of abdomen and pelvis on 7/5 which suggested acute cholecystitis.  Last dose of elaquis was this morning @ 0930.  Pharmacy consulted to dose  heparin for pending surgery.  Baseline aPTT and HL ordered H/H and Plts WNL  Goal of Therapy:  Heparin level 0.3-0.7 units/ml Monitor platelets by anticoagulation protocol: yes   Plan:   At 2130 start heparin drip (no bolus) @ 1000 Units/hr  Check aPTT/ HL in 8 hours  F/u surgery plans  Dolly Rias RPh 06/11/2015, 6:48 PM Pager [phone]

## 2015-06-11 NOTE — ED Notes (Addendum)
Pt requested to hold ABT until after IR perc chole test d/t ABT causing diarrhea.

## 2015-06-11 NOTE — Progress Notes (Signed)
Received report from ED RN, PT arrived room accompanied by family. Will continue with current plan of care.

## 2015-06-11 NOTE — H&P (Signed)
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 Location is RUQ/right flank. Onset was gradual. Coarse in unchanged. Moderate in severity. Time pattern is intermittent. Exacerbated by position or taking a deep breath. Alleviated by pain medication only to return without it. No worsening symptoms with PO intake, however, the patients appetite has been poor. His symptoms are associated with nausea and vomiting and fevers, however, none since initial onset of symptoms 2 weeks ago. He reports constipation. Denies diarrhea, melena or hematochezia.   He had a CT of abdomen and pelvis on 7/5 which suggested acute cholecystitis. He had a abdominal US today which confirmed distended gallbladder, gallstones and sludge. Also a possible mass at lateral gallbladder, but after review with radiologist and lack of this finding on the CT scan it was thought more to be adherent sludge.  LFTs are normal. Afebrile. Normal lipase and normal white count. We have been asked to evaluate the patient for cholecystitis management.   Last oral intake was yesterday and last dose of Eliquis was this morning.   Past Medical History  Diagnosis Date  . Hypertension   . GERD (gastroesophageal reflux disease)   . Alcohol abuse   . 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 Low sodium   . Persistent atrial fibrillation 04/11/14    chads2vasc score is 2  . Hypotension   . Atrial fibrillation with RVR   . HCAP (healthcare-associated pneumonia)   . Adrenal hemorrhage   . BPH (benign prostatic hyperplasia)   . Hyponatremia   . Anemia of chronic disease   . Fall   . Alcohol intoxication   . Pulmonary nodules   . Adrenal mass   . Atrial flutter by electrocardiogram   . Antineoplastic  chemotherapy induced anemia(285.3)   . Alcoholism in remission   . Radiation 12/02/14-12/16/14    Left adrenal gland 30 Gy in 10 fractions  . Cancer     small cell/lung  . Neoplastic malignant related fatigue     Past Surgical History  Procedure Laterality Date  . Hemorrhoid surgery    . Ankle arthrodesis  1995    rt fx-hardware in  . Tonsillectomy    . Colonoscopy    . Excision / curettage bone cyst finger  2005    rt thumb  . Total shoulder arthroplasty  02/03/2012    Procedure: TOTAL SHOULDER ARTHROPLASTY;  Surgeon: [name], MD;  Location: [place];  Service: Orthopedics;  Laterality: Left;  . Cardioversion N/A 09/29/2014    Procedure: CARDIOVERSION;  Surgeon: Josue Hector, MD;  Location: [facility] END[facility]COPY;  Service: Cardiovascular;  Laterality: N/A;    Family History  Problem Relation Age of Onset  . Brain cancer Brother   . Lung cancer Mother   . Hypertension Father   . Diabetes Paternal Grandmother   . Heart attack Father   . Heart attack Brother    Social History:  reports that he quit smoking about 24 years ago. His smoking use included Cigarettes. He has a 60 pack-year smoking history. He has never used smokeless tobacco. He reports that he does not drink alcohol or use illicit drugs.  Allergies:  Allergies  Allergen Reactions  . Aspirin Other (See Comments)    Pt can not have Asprin due to  medical conditions according to spouse     (Not in a hospital admission)  Results for orders placed or performed during the hospital encounter of 06/11/15 (from the past 48 hour(s))  Comprehensive metabolic panel     Status: Abnormal   Collection Time: 06/11/15 11:32 AM  Result Value Ref Range   Sodium 128 (L) 135 - 145 mmol/L   Potassium 3.5 3.5 - 5.1 mmol/L   Chloride 88 (L) 101 - 111 mmol/L   CO2 30 22 - 32 mmol/L   Glucose, Bld 108 (H) 65 - 99 mg/dL   BUN 12 6 - 20 mg/dL   Creatinine, Ser 0.79 0.61 - 1.24 mg/dL   Calcium 8.7 (L) 8.9 - 10.3  mg/dL   Total Protein 6.8 6.5 - 8.1 g/dL   Albumin 3.7 3.5 - 5.0 g/dL   AST 24 15 - 41 U/L   ALT 17 17 - 63 U/L   Alkaline Phosphatase 84 38 - 126 U/L   Total Bilirubin 1.0 0.3 - 1.2 mg/dL   GFR calc non Af Amer >60 >60 mL/min   GFR calc Af Amer >60 >60 mL/min    Comment: (NOTE) The eGFR has been calculated using the CKD EPI equation. This calculation has not been validated in all clinical situations. eGFR's persistently <60 mL/min signify possible Chronic Kidney Disease.    Anion gap 10 5 - 15  Lipase, blood     Status: Abnormal   Collection Time: 06/11/15 11:32 AM  Result Value Ref Range   Lipase <10 (L) 22 - 51 U/L  CBC with Differential     Status: Abnormal   Collection Time: 06/11/15 11:32 AM  Result Value Ref Range   WBC 6.3 4.0 - 10.5 K/uL   RBC 3.96 (L) 4.22 - 5.81 MIL/uL   Hemoglobin 12.7 (L) 13.0 - 17.0 g/dL   HCT 35.7 (L) 39.0 - 52.0 %   MCV 90.2 78.0 - 100.0 fL   MCH 32.1 26.0 - 34.0 pg   MCHC 35.6 30.0 - 36.0 g/dL   RDW 13.3 11.5 - 15.5 %   Platelets 340 150 - 400 K/uL   Neutrophils Relative % 67 43 - 77 %   Neutro Abs 4.2 1.7 - 7.7 K/uL   Lymphocytes Relative 14 12 - 46 %   Lymphs Abs 0.9 0.7 - 4.0 K/uL   Monocytes Relative 14 (H) 3 - 12 %   Monocytes Absolute 0.9 0.1 - 1.0 K/uL   Eosinophils Relative 5 0 - 5 %   Eosinophils Absolute 0.3 0.0 - 0.7 K/uL   Basophils Relative 0 0 - 1 %   Basophils Absolute 0.0 0.0 - 0.1 K/uL   US Abdomen Complete  06/11/2015   CLINICAL DATA:  Abnormal gallbladder on CT  EXAM: ULTRASOUND ABDOMEN COMPLETE  COMPARISON:  CT abdomen pelvis 06/09/2015  FINDINGS: Gallbladder: Distended gallbladder containing gallstones and extensive sludge. Gallbladder wall is thickened at 5.4 mm. Negative sonographic Murphy sign.  There is focal soft tissue thickening of the lateral wall of the gallbladder measuring 14 mm in thickness raising the possibility of a mass lesion. This is difficult difficult to confirm on the CT scan.  Common bile duct:  Diameter: 3.6 mm  Liver: 9 mm cyst.  No other liver mass lesion.  IVC: No abnormality visualized.  Pancreas: Not visualized  Spleen: Size and appearance within normal limits.  Right Kidney: Length: 10.7 cm. Echogenicity within normal limits. No mass or hydronephrosis visualized.  Left Kidney: Length: 10.9 cm. Echogenicity  within normal limits. No mass or hydronephrosis visualized.  Abdominal aorta: No aneurysm visualized.  Other findings: None.  IMPRESSION: Distended gallbladder containing gallstones and sludge. Gallbladder wall thickening. The patient is nontender over the gallbladder. Possible acute cholecystitis. The patient currently has a normal white blood count. There is a possible mass the lateral gallbladder which could be due to carcinoma versus sludge or inflammation.   Electronically Signed   By: Franchot Gallo M.D.   On: 06/11/2015 12:29    Review of Systems  Constitutional: Positive for weight loss and malaise/fatigue. Negative for fever, chills and diaphoresis.  Eyes: Negative for blurred vision, double vision, photophobia, pain, discharge and redness.  Respiratory: Negative for cough, hemoptysis, sputum production, shortness of breath and wheezing.   Cardiovascular: Negative for chest pain, palpitations, orthopnea, claudication, leg swelling and PND.  Gastrointestinal: Positive for heartburn, abdominal pain and constipation. Negative for nausea, vomiting, diarrhea, blood in stool and melena.  Genitourinary: Negative for dysuria, urgency, frequency, hematuria and flank pain.  Musculoskeletal: Negative for myalgias, back pain, joint pain, falls and neck pain.  Neurological: Negative for dizziness, tingling, tremors, sensory change, speech change, focal weakness, seizures, loss of consciousness, weakness and headaches.  Psychiatric/Behavioral: Negative for depression, suicidal ideas, hallucinations, memory loss and substance abuse. The patient has insomnia. The patient is not  nervous/anxious.     Blood pressure 131/63, pulse 64, temperature 98.1 F (36.7 C), temperature source Oral, resp. rate 18, SpO2 97 %. Physical Exam  Constitutional: He is oriented to person, place, and time. He appears well-developed and well-nourished. No distress.  HENT:  Head: Normocephalic and atraumatic.  Eyes: No scleral icterus.  Neck: Normal range of motion. Neck supple.  Cardiovascular: Normal rate, regular rhythm, normal heart sounds and intact distal pulses.  Exam reveals no gallop and no friction rub.   No murmur heard. Respiratory: Effort normal and breath sounds normal. No respiratory distress. He has no wheezes. He has no rales. He exhibits no tenderness.  GI: Soft. Bowel sounds are normal. He exhibits no distension and no mass. There is no rebound and no guarding.  Significant right intercostal tenderness.  TTP to RUQ with deep palpation.   Musculoskeletal: Normal range of motion. He exhibits no edema or tenderness.  Lymphadenopathy:    He has no cervical adenopathy.  Neurological: He is alert and oriented to person, place, and time.  Skin: Skin is warm and dry. No rash noted. He is not diaphoretic. No erythema. No pallor.  Psychiatric: He has a normal mood and affect. His behavior is normal. Judgment and thought content normal.     Assessment/Plan Acute cholecystitis-medicine to admit, but Dr. Wendee Beavers declined so we will therefore admit and medicine to consult. Will start Zosyn, keep NPO, IV hydration, pain control and anti-emetics. Will proceed with a HIDA scan to confirm.  Given duration of symptoms, unclear prognosis of lung cancer, atrial fibrillation and anticoagulation, the best approach would be a percutaneous cholecystostomy tube which to remain in place for at least 6 weeks. I have called interventional radiology to evaluate. I will clarify with Dr. [name] regarding patients prognosis.   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 [name] and currently on  immunotherapy. S/p palliative radiation to left adrenal gland 1/16   Right breast enlargement-further work up per Dr. [name]   Atrial fibrillation-hold Eliquis, may have heparin/lovenox per medicine. Last dose 7/7 AM. Place on telemetry.   Hyponatremia-Na 128.  Will start NS with KCL at 162m/hr  Hypertension-continue home meds  Dispo-admit  Eufemia Prindle ANP-BC 06/11/2015, 2:41 PM

## 2015-06-11 NOTE — ED Notes (Signed)
Pt presents with c/o abdominal pain. Pt reports he had a CT on Tuesday and a follow-up with his MD on yesterday. Pt reports his CT was abnormal in terms of his gallbladder.

## 2015-06-11 NOTE — Consult Note (Deleted)
Reason for Consult: acute cholecystitis Referring Physician: Dr. 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  Location is RUQ/right flank.  Onset was gradual.  Coarse in unchanged.  Moderate in severity.  Time pattern is intermittent.  Exacerbated by position or taking a deep breath.  Alleviated by pain medication only to return without it.  No worsening symptoms with PO intake, however, the patients appetite has been poor.  His symptoms are associated with nausea and vomiting and fevers, however, none since initial onset of symptoms 2 weeks ago.  He reports constipation.  Denies diarrhea, melena or hematochezia.    He had a CT of abdomen and pelvis on 7/5 which suggested acute cholecystitis.  He had a abdominal US today which confirmed distended gallbladder, gallstones and sludge.  Also a possible mass at lateral gallbladder, but after review with radiologist and lack of this finding on the CT scan it was thought more to be adherent sludge.   LFTs are normal.  Afebrile.  Normal lipase and normal white count.  We have been asked to evaluate the patient for cholecystitis management.   Last oral intake was yesterday and last dose of Eliquis was this morning.     Past Medical History  Diagnosis Date  . Hypertension   . GERD (gastroesophageal reflux disease)   . Alcohol abuse   . 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 Low sodium   . Persistent atrial fibrillation 04/11/14    chads2vasc score is 2  . Hypotension   . Atrial fibrillation with RVR   . HCAP (healthcare-associated pneumonia)   . Adrenal hemorrhage   . BPH (benign prostatic hyperplasia)   . Hyponatremia   . Anemia of chronic disease   . Fall   . Alcohol intoxication   . Pulmonary nodules   . Adrenal mass    . Atrial flutter by electrocardiogram   . 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 Alcoholism in remission   . Radiation 12/02/14-12/16/14    Left adrenal gland 30 Gy in 10 fractions  . Cancer     small cell/lung  . Neoplastic malignant related fatigue     Past Surgical History  Procedure Laterality Date  . Hemorrhoid surgery    . Ankle arthrodesis  1995    rt fx-hardware in  . Tonsillectomy    . Colonoscopy    . Excision / curettage bone cyst finger  2005    rt thumb  . Total shoulder arthroplasty  02/03/2012    Procedure: TOTAL SHOULDER ARTHROPLASTY;  Surgeon: [name], MD;  Location: [facility];  Service: Orthopedics;  Laterality: Left;  . Cardioversion N/A 09/29/2014    Procedure: CARDIOVERSION;  Surgeon: Josue Hector, MD;  Location: [facility];  Service: Cardiovascular;  Laterality: N/A;    Family History  Problem Relation Age of Onset  . Brain cancer Brother   . Lung cancer Mother   . Hypertension Father   . Diabetes Paternal Grandmother   . Heart attack Father   . Heart attack Brother     Social History:  reports that he quit smoking about 24 years ago. His smoking use included Cigarettes. He has a 60 pack-year smoking history. He has never used smokeless tobacco. He reports that he does not  drink alcohol or use illicit drugs.  Allergies:  Allergies  Allergen Reactions  . Aspirin Other (See Comments)    Pt can not have Asprin due to medical conditions according to spouse    Medications:  Prior to Admission medications   Medication Sig Start Date End Date Taking? Authorizing Provider  ALPRAZolam (XANAX) 0.25 MG tablet Take 0.25 mg by mouth 3 (three) times daily as needed for anxiety or sleep.   Yes Historical Provider, MD  apixaban (ELIQUIS) 5 MG TABS tablet Take 1 tablet (5 mg total) by mouth 2 (two) times daily. 12/12/14  Yes Thompson Grayer, MD  Ascorbic Acid (VITAMIN C PO) Take 1,000 mg by mouth daily.    Yes Historical  Provider, MD  B Complex-C (SUPER B COMPLEX PO) Take 1 each by mouth daily.    Yes Historical Provider, MD  Biotin 7500 MCG TABS Take 1 tablet by mouth daily.    Yes Historical Provider, MD  calcium carbonate (OS-CAL) 600 MG TABS Take 600 mg by mouth daily with breakfast.   Yes Historical Provider, MD  demeclocycline (DECLOMYCIN) 150 MG tablet Take 150 mg by mouth 2 (two) times daily.   Yes Historical Provider, MD  diltiazem (CARDIZEM CD) 180 MG 24 hr capsule Take 1 capsule (180 mg total) by mouth daily at 12 noon. 01/09/15  Yes Sherran Needs, NP  furosemide (LASIX) 40 MG tablet Take 1 tablet (40 mg total) by mouth daily. 05/11/15  Yes Thompson Grayer, MD  HYDROcodone-acetaminophen (NORCO/VICODIN) 5-325 MG per tablet Take 1 tablet by mouth every 6 (six) hours as needed. Patient taking differently: Take 1 tablet by mouth every 6 (six) hours as needed for severe pain.  06/01/15  Yes Ankit Nanavati, MD  KLOR-CON M20 20 MEQ tablet Take 20 mEq by mouth daily.  10/18/14  Yes Historical Provider, MD  Lactobacillus (ACIDOPHILUS) CAPS capsule Take 1 capsule by mouth daily.   Yes Historical Provider, MD  MAGNESIUM PO Take 400 mg by mouth daily.    Yes Historical Provider, MD  metoprolol tartrate (LOPRESSOR) 25 MG tablet Take 0.5 tablets (12.5 mg total) by mouth 2 (two) times daily. Patient taking differently: Take 6.25 mg by mouth 2 (two) times daily.  05/11/15  Yes Thompson Grayer, MD  mirtazapine (REMERON) 15 MG tablet Take 1 tablet (15 mg total) by mouth at bedtime. Patient taking differently: Take 15 mg by mouth at bedtime as needed (sleep).  01/19/15  Yes [name], MD  Multiple Vitamin (MULTIVITAMIN WITH MINERALS) TABS Take 1 tablet by mouth daily.   Yes Historical Provider, MD  Nivolumab (OPDIVO IV) Inject as directed every 2 weeks   Yes Historical Provider, MD  Nutritional Supplements (ENSURE COMPLETE PO) Take 1 Can by mouth 2 (two) times daily.   Yes Historical Provider, MD  Omega-3 Fatty Acids (FISH OIL  PO) Take 1 tablet by mouth daily.   Yes Historical Provider, MD  ondansetron (ZOFRAN ODT) 8 MG disintegrating tablet Take 1 tablet (8 mg total) by mouth every 8 (eight) hours as needed for nausea. 06/01/15  Yes Varney Biles, MD  pantoprazole (PROTONIX) 40 MG tablet Take 40 mg by mouth every morning. 06/02/15  Yes Historical Provider, MD  tamsulosin (FLOMAX) 0.4 MG CAPS capsule Take 0.4 mg by mouth daily.    Yes Historical Provider, MD  thiamine (VITAMIN B-1) 100 MG tablet Take 1 tablet (100 mg total) by mouth daily. 09/11/13  Yes Erick Colace, NP  vitamin E 400 UNIT capsule Take 400 Units  by mouth daily.   Yes Historical Provider, MD  sucralfate (CARAFATE) 1 G tablet Take 1 tablet (1 g total) by mouth 4 (four) times daily -  with meals and at bedtime. Patient not taking: Reported on 06/11/2015 06/01/15   Varney Biles, MD     Results for orders placed or performed during the hospital encounter of 06/11/15 (from the past 48 hour(s))  Comprehensive metabolic panel     Status: Abnormal   Collection Time: 06/11/15 11:32 AM  Result Value Ref Range   Sodium 128 (L) 135 - 145 mmol/L   Potassium 3.5 3.5 - 5.1 mmol/L   Chloride 88 (L) 101 - 111 mmol/L   CO2 30 22 - 32 mmol/L   Glucose, Bld 108 (H) 65 - 99 mg/dL   BUN 12 6 - 20 mg/dL   Creatinine, Ser 0.79 0.61 - 1.24 mg/dL   Calcium 8.7 (L) 8.9 - 10.3 mg/dL   Total Protein 6.8 6.5 - 8.1 g/dL   Albumin 3.7 3.5 - 5.0 g/dL   AST 24 15 - 41 U/L   ALT 17 17 - 63 U/L   Alkaline Phosphatase 84 38 - 126 U/L   Total Bilirubin 1.0 0.3 - 1.2 mg/dL   GFR calc non Af Amer >60 >60 mL/min   GFR calc Af Amer >60 >60 mL/min    Comment: (NOTE) The eGFR has been calculated using the CKD EPI equation. This calculation has not been validated in all clinical situations. eGFR's persistently <60 mL/min signify possible Chronic Kidney Disease.    Anion gap 10 5 - 15  Lipase, blood     Status: Abnormal   Collection Time: 06/11/15 11:32 AM  Result Value Ref  Range   Lipase <10 (L) 22 - 51 U/L  CBC with Differential     Status: Abnormal   Collection Time: 06/11/15 11:32 AM  Result Value Ref Range   WBC 6.3 4.0 - 10.5 K/uL   RBC 3.96 (L) 4.22 - 5.81 MIL/uL   Hemoglobin 12.7 (L) 13.0 - 17.0 g/dL   HCT 35.7 (L) 39.0 - 52.0 %   MCV 90.2 78.0 - 100.0 fL   MCH 32.1 26.0 - 34.0 pg   MCHC 35.6 30.0 - 36.0 g/dL   RDW 13.3 11.5 - 15.5 %   Platelets 340 150 - 400 K/uL   Neutrophils Relative % 67 43 - 77 %   Neutro Abs 4.2 1.7 - 7.7 K/uL   Lymphocytes Relative 14 12 - 46 %   Lymphs Abs 0.9 0.7 - 4.0 K/uL   Monocytes Relative 14 (H) 3 - 12 %   Monocytes Absolute 0.9 0.1 - 1.0 K/uL   Eosinophils Relative 5 0 - 5 %   Eosinophils Absolute 0.3 0.0 - 0.7 K/uL   Basophils Relative 0 0 - 1 %   Basophils Absolute 0.0 0.0 - 0.1 K/uL    Ct Chest W Contrast  06/09/2015   CLINICAL DATA:  Subsequent evaluation of the a 74 year old male with history of lung cancer diagnosed in November 2015. Right-sided chest pain, and pain during deep inspiration for 1 week. Chemotherapy and radiation therapy now complete. Oral chemotherapy ongoing. Prior smoker.  EXAM: CT CHEST, ABDOMEN, AND PELVIS WITH CONTRAST  TECHNIQUE: Multidetector CT imaging of the chest, abdomen and pelvis was performed following the standard protocol during bolus administration of intravenous contrast.  CONTRAST:  162mL OMNIPAQUE IOHEXOL 300 MG/ML  SOLN  COMPARISON:  Chest CT 06/01/2015. CT of the chest, abdomen and pelvis 04/10/2015.  FINDINGS: CT CHEST FINDINGS  Mediastinum/Lymph Nodes: Heart size is normal. Small amount of anterior pericardial fluid and/or thickening, similar to prior examinations, and the unlikely to be of any hemodynamic significance at this time. No associated pericardial calcification. There is atherosclerosis of the thoracic aorta, the great vessels of the mediastinum and the coronary arteries, including calcified atherosclerotic plaque in the left main, left anterior descending and  right coronary arteries. No pathologically enlarged mediastinal or hilar lymph nodes. Esophagus is unremarkable in appearance. No axillary lymphadenopathy.  Lungs/Pleura: Previously noted nodule in the medial aspect of the apex of the left upper lobe (image 9 of series 4) is stable to slightly decreased in size compared to the prior examination, currently measuring 13 x 7 mm. The other previously described 8 mm spiculated nodule in the left upper lobe is now a very ill-defined area of ground-glass attenuation (image 19 of series 4). Several other scattered areas of ground-glass attenuation are also noted in the upper lobes of the lungs bilaterally, which appear similar to the prior examination. Linear scarring in the left lower lobe is unchanged. New linear opacities and architectural distortion in the right lower lobe may reflect new areas of developing scarring, or may simply reflect subsegmental atelectasis. Mild diffuse bronchial wall thickening with mild centrilobular and paraseptal emphysema. No acute consolidative airspace disease. No pleural effusions.  Musculoskeletal/Soft Tissues: Asymmetric right-sided gynecomastia again noted, increased in size measuring up to 3.2 x 1.3 cm on today's examination (previously 2.5 x 1.2 cm on prior study 11/10/2014), and completely new compared to remote prior PET-CT 08/27/2013. Multiple old healed right-sided rib fractures. Status post left shoulder hemiarthroplasty. Chronic posttraumatic degenerative changes in the right shoulder redemonstrated. There are no aggressive appearing lytic or blastic lesions noted in the visualized portions of the skeleton.  CT ABDOMEN AND PELVIS FINDINGS  Hepatobiliary: Several tiny sub cm low-attenuation liver lesions and sub cm hypervascular lesions are unchanged in size, number and distribution in the liver, likely to reflect a combination of small cysts and flash fill cavernous hemangiomas. No new aggressive appearing liver lesions are  noted. No intra or extrahepatic biliary ductal dilatation. However, there does appear to be a 4 mm partially calcified stone in the distal common bile duct (image 71 of series 2). There are several partially calcified gallstones lying dependently in the gallbladder, and the gallbladder now appears moderately distended, with a thickened wall which measures up to 9 mm, with small amount of pericholecystic fluid and extensive pericholecystic stranding, strongly suggestive of acute cholecystitis at this time.  Pancreas: No pancreatic mass. No pancreatic ductal dilatation. No pancreatic or peripancreatic fluid or inflammatory changes.  Spleen: Unremarkable.  Adrenals/Urinary Tract: Bilateral perinephric stranding (nonspecific). Multifocal cortical thinning in the kidneys bilaterally, presumably chronic post infectious or inflammatory scarring. Sub cm low-attenuation lesions in the kidneys bilaterally are too small to definitively characterize, but are similar to prior studies, presumably small cysts. Mild bilateral adrenal nodularity, now difficult to accurately measure, but smaller than the prior examinations. No hydroureteronephrosis. Urinary bladder is remarkable for multiple small bladder wall diverticulae.  Stomach/Bowel: Normal appearance of the stomach. No pathologic dilatation of small bowel or colon. A few scattered colonic diverticulae are noted, without surrounding inflammatory changes to suggest an acute diverticulitis at this time.  Vascular/Lymphatic: Extensive atherosclerosis throughout the abdominal and pelvic vasculature, without evidence of aneurysm or dissection.  Reproductive: Prostate gland and seminal vesicles are unremarkable in appearance.  Other: Trace volume of ascites.  No pneumoperitoneum.  Musculoskeletal: There  are no aggressive appearing lytic or blastic lesions noted in the visualized portions of the skeleton. Extensive multilevel degenerative disc disease and spondylosis throughout the  lumbar spine.  IMPRESSION: 1. Findings are highly concerning for acute cholecystitis. This could be confirmed with right upper quadrant ultrasound if clinically appropriate. Surgical consultation is also suggested. 2. Generally stable (or slightly improved) appearance of multiple pulmonary nodules (most of which are ground-glass attenuation) in the lungs bilaterally, as above. Continued attention on followup studies is recommended to ensure continued stability. 3. Slow progressive enlargement of asymmetric gynecomastia. This could represent a soft tissue metastasis, or primary male breast cancer, and further evaluation with breast ultrasound and/or mammography is suggested if clinically appropriate. 4. Additional incidental findings, as above, generally similar prior examinations. These results will be called to the ordering clinician or representative by the Radiologist Assistant, and communication documented in the PACS or zVision Dashboard.   Electronically Signed   By: Vinnie Langton M.D.   On: 06/09/2015 15:45   US Abdomen Complete  06/11/2015   CLINICAL DATA:  Abnormal gallbladder on CT  EXAM: ULTRASOUND ABDOMEN COMPLETE  COMPARISON:  CT abdomen pelvis 06/09/2015  FINDINGS: Gallbladder: Distended gallbladder containing gallstones and extensive sludge. Gallbladder wall is thickened at 5.4 mm. Negative sonographic Murphy sign.  There is focal soft tissue thickening of the lateral wall of the gallbladder measuring 14 mm in thickness raising the possibility of a mass lesion. This is difficult difficult to confirm on the CT scan.  Common bile duct: Diameter: 3.6 mm  Liver: 9 mm cyst.  No other liver mass lesion.  IVC: No abnormality visualized.  Pancreas: Not visualized  Spleen: Size and appearance within normal limits.  Right Kidney: Length: 10.7 cm. Echogenicity within normal limits. No mass or hydronephrosis visualized.  Left Kidney: Length: 10.9 cm. Echogenicity within normal limits. No mass or  hydronephrosis visualized.  Abdominal aorta: No aneurysm visualized.  Other findings: None.  IMPRESSION: Distended gallbladder containing gallstones and sludge. Gallbladder wall thickening. The patient is nontender over the gallbladder. Possible acute cholecystitis. The patient currently has a normal white blood count. There is a possible mass the lateral gallbladder which could be due to carcinoma versus sludge or inflammation.   Electronically Signed   By: Franchot Gallo M.D.   On: 06/11/2015 12:29   Ct Abdomen Pelvis W Contrast  06/09/2015   CLINICAL DATA:  Subsequent evaluation of the a 74 year old male with history of lung cancer diagnosed in November 2015. Right-sided chest pain, and pain during deep inspiration for 1 week. Chemotherapy and radiation therapy now complete. Oral chemotherapy ongoing. Prior smoker.  EXAM: CT CHEST, ABDOMEN, AND PELVIS WITH CONTRAST  TECHNIQUE: Multidetector CT imaging of the chest, abdomen and pelvis was performed following the standard protocol during bolus administration of intravenous contrast.  CONTRAST:  123mL OMNIPAQUE IOHEXOL 300 MG/ML  SOLN  COMPARISON:  Chest CT 06/01/2015. CT of the chest, abdomen and pelvis 04/10/2015.  FINDINGS: CT CHEST FINDINGS  Mediastinum/Lymph Nodes: Heart size is normal. Small amount of anterior pericardial fluid and/or thickening, similar to prior examinations, and the unlikely to be of any hemodynamic significance at this time. No associated pericardial calcification. There is atherosclerosis of the thoracic aorta, the great vessels of the mediastinum and the coronary arteries, including calcified atherosclerotic plaque in the left main, left anterior descending and right coronary arteries. No pathologically enlarged mediastinal or hilar lymph nodes. Esophagus is unremarkable in appearance. No axillary lymphadenopathy.  Lungs/Pleura: Previously noted nodule in the medial  aspect of the apex of the left upper lobe (image 9 of series 4) is  stable to slightly decreased in size compared to the prior examination, currently measuring 13 x 7 mm. The other previously described 8 mm spiculated nodule in the left upper lobe is now a very ill-defined area of ground-glass attenuation (image 19 of series 4). Several other scattered areas of ground-glass attenuation are also noted in the upper lobes of the lungs bilaterally, which appear similar to the prior examination. Linear scarring in the left lower lobe is unchanged. New linear opacities and architectural distortion in the right lower lobe may reflect new areas of developing scarring, or may simply reflect subsegmental atelectasis. Mild diffuse bronchial wall thickening with mild centrilobular and paraseptal emphysema. No acute consolidative airspace disease. No pleural effusions.  Musculoskeletal/Soft Tissues: Asymmetric right-sided gynecomastia again noted, increased in size measuring up to 3.2 x 1.3 cm on today's examination (previously 2.5 x 1.2 cm on prior study 11/10/2014), and completely new compared to remote prior PET-CT 08/27/2013. Multiple old healed right-sided rib fractures. Status post left shoulder hemiarthroplasty. Chronic posttraumatic degenerative changes in the right shoulder redemonstrated. There are no aggressive appearing lytic or blastic lesions noted in the visualized portions of the skeleton.  CT ABDOMEN AND PELVIS FINDINGS  Hepatobiliary: Several tiny sub cm low-attenuation liver lesions and sub cm hypervascular lesions are unchanged in size, number and distribution in the liver, likely to reflect a combination of small cysts and flash fill cavernous hemangiomas. No new aggressive appearing liver lesions are noted. No intra or extrahepatic biliary ductal dilatation. However, there does appear to be a 4 mm partially calcified stone in the distal common bile duct (image 71 of series 2). There are several partially calcified gallstones lying dependently in the gallbladder, and the  gallbladder now appears moderately distended, with a thickened wall which measures up to 9 mm, with small amount of pericholecystic fluid and extensive pericholecystic stranding, strongly suggestive of acute cholecystitis at this time.  Pancreas: No pancreatic mass. No pancreatic ductal dilatation. No pancreatic or peripancreatic fluid or inflammatory changes.  Spleen: Unremarkable.  Adrenals/Urinary Tract: Bilateral perinephric stranding (nonspecific). Multifocal cortical thinning in the kidneys bilaterally, presumably chronic post infectious or inflammatory scarring. Sub cm low-attenuation lesions in the kidneys bilaterally are too small to definitively characterize, but are similar to prior studies, presumably small cysts. Mild bilateral adrenal nodularity, now difficult to accurately measure, but smaller than the prior examinations. No hydroureteronephrosis. Urinary bladder is remarkable for multiple small bladder wall diverticulae.  Stomach/Bowel: Normal appearance of the stomach. No pathologic dilatation of small bowel or colon. A few scattered colonic diverticulae are noted, without surrounding inflammatory changes to suggest an acute diverticulitis at this time.  Vascular/Lymphatic: Extensive atherosclerosis throughout the abdominal and pelvic vasculature, without evidence of aneurysm or dissection.  Reproductive: Prostate gland and seminal vesicles are unremarkable in appearance.  Other: Trace volume of ascites.  No pneumoperitoneum.  Musculoskeletal: There are no aggressive appearing lytic or blastic lesions noted in the visualized portions of the skeleton. Extensive multilevel degenerative disc disease and spondylosis throughout the lumbar spine.  IMPRESSION: 1. Findings are highly concerning for acute cholecystitis. This could be confirmed with right upper quadrant ultrasound if clinically appropriate. Surgical consultation is also suggested. 2. Generally stable (or slightly improved) appearance of  multiple pulmonary nodules (most of which are ground-glass attenuation) in the lungs bilaterally, as above. Continued attention on followup studies is recommended to ensure continued stability. 3. Slow progressive enlargement of asymmetric  gynecomastia. This could represent a soft tissue metastasis, or primary male breast cancer, and further evaluation with breast ultrasound and/or mammography is suggested if clinically appropriate. 4. Additional incidental findings, as above, generally similar prior examinations. These results will be called to the ordering clinician or representative by the Radiologist Assistant, and communication documented in the PACS or zVision Dashboard.   Electronically Signed   By: Vinnie Langton M.D.   On: 06/09/2015 15:45    Review of Systems  Constitutional: Positive for weight loss and malaise/fatigue. Negative for fever, chills and diaphoresis.  Eyes: Negative for blurred vision, double vision, photophobia, pain, discharge and redness.  Respiratory: Negative for cough, hemoptysis, sputum production, shortness of breath and wheezing.   Cardiovascular: Negative for chest pain, palpitations, orthopnea, claudication, leg swelling and PND.  Gastrointestinal: Positive for abdominal pain and constipation. Negative for heartburn, nausea, vomiting, diarrhea, blood in stool and melena.  Genitourinary: Negative for dysuria, urgency, frequency, hematuria and flank pain.  Musculoskeletal: Negative for myalgias, back pain, joint pain, falls and neck pain.  Skin: Negative for itching and rash.  Neurological: Negative for dizziness, tingling, tremors, sensory change, speech change, focal weakness, seizures, loss of consciousness, weakness and headaches.  Psychiatric/Behavioral: Negative for depression, suicidal ideas, hallucinations, memory loss and substance abuse. The patient has insomnia. The patient is not nervous/anxious.    Blood pressure 131/63, pulse 64, temperature 98.1 F  (36.7 C), temperature source Oral, resp. rate 18, SpO2 97 %. Physical Exam  Constitutional: He is oriented to person, place, and time. He appears well-developed and well-nourished. No distress.  HENT:  Head: Normocephalic and atraumatic.  Mouth/Throat: No oropharyngeal exudate.  Eyes: Right eye exhibits no discharge. Left eye exhibits no discharge. No scleral icterus.  Neck: Normal range of motion.  Cardiovascular: Normal rate, regular rhythm, normal heart sounds and intact distal pulses.  Exam reveals no gallop and no friction rub.   No murmur heard. Respiratory: Effort normal and breath sounds normal. No respiratory distress. He has no wheezes. He has no rales. He exhibits no tenderness.  GI: Soft. Bowel sounds are normal. He exhibits no distension and no mass. There is no rebound and no guarding.  TTP with deep palpation to RUQ  Musculoskeletal: Normal range of motion. He exhibits no edema or tenderness.  Neurological: He is alert and oriented to person, place, and time.  Skin: Skin is warm and dry. No rash noted. He is not diaphoretic. No erythema. No pallor.  Psychiatric: He has a normal mood and affect. His behavior is normal. Judgment and thought content normal.    Assessment/Plan: Acute cholecystitis-medicine to admit and surgery to consult.  Will start Zosyn, keep NPO, IV hydration, pain control and anti-emetics.  Given duration of symptoms, unclear prognosis of lung cancer, atrial fibrillation and anticoagulation, the best approach would be a percutaneous cholecystostomy tube which to remain in place for at least 6 weeks.  I have called interventional radiology to evaluate.  Will not obtain a HIDA scan unless IR feels its warranted.  I will clarify with Dr. [name] regarding patients prognosis.   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  S/p palliative radiation to left adrenal gland 1/16   Right breast enlargement-further work up per Dr.  [name]   Atrial fibrillation-hold Eliquis, may have heparin/lovenox per primary team.  Last dose 7/7 AM.  Hyponatremia-Na 128    Alicen Donalson, [place] ANP-BC 06/11/2015, 1:48 PM

## 2015-06-11 NOTE — ED Notes (Signed)
MD (Surgeon) at bedside.

## 2015-06-11 NOTE — ED Notes (Signed)
Pt returned from nuclear med and taken to room.

## 2015-06-11 NOTE — ED Notes (Signed)
Nurse currently starting IV 

## 2015-06-11 NOTE — ED Notes (Signed)
Pt taken to nuclear [facility].

## 2015-06-11 NOTE — ED Notes (Signed)
Pt denies abd pain at this time. Pt reported that he was sent here from his PMD for his gallbladder d/t not able to have an operation soon. No report of n/v/d. Resp even and unlabored. ABC's intact.

## 2015-06-11 NOTE — ED Provider Notes (Signed)
CSN: [id number]     Arrival date & time 06/11/15  1051 History   First MD Initiated Contact with Patient 06/11/15 1055     Chief Complaint  Patient presents with  . Abdominal Pain      HPI Pt was seen at 1055.  Per pt and his family, c/o gradual onset and persistence of constant RUQ abd "pain" for the past 2 to 3 weeks.  Has been associated with decreased appetite. Pt was evaluated by his Heme/Onc MD 2 days ago for same with CT scan. Pt's wife states pt's "gallbladder was abnormal" and "he needs an operation on it," so he was sent to the ED for [place] abd and General Surgery consult.  Denies N/V, no diarrhea, no fevers, no back pain, no rash, no CP/SOB.       Past Medical History  Diagnosis Date  . Hypertension   . GERD (gastroesophageal reflux disease)   . Alcohol abuse   . 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 Low sodium   . Persistent atrial fibrillation 04/11/14    chads2vasc score is 2  . Hypotension   . Atrial fibrillation with RVR   . HCAP (healthcare-associated pneumonia)   . Adrenal hemorrhage   . BPH (benign prostatic hyperplasia)   . Hyponatremia   . Anemia of chronic disease   . Fall   . Alcohol intoxication   . Pulmonary nodules   . Adrenal mass   . Atrial flutter by electrocardiogram   . 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 Alcoholism in remission   . Radiation 12/02/14-12/16/14    Left adrenal gland 30 Gy in 10 fractions  . Cancer     small cell/lung  . Neoplastic malignant related fatigue    Past Surgical History  Procedure Laterality Date  . Hemorrhoid surgery    . Ankle arthrodesis  1995    rt fx-hardware in  . Tonsillectomy    . Colonoscopy    . Excision / curettage bone cyst finger  2005    rt thumb  . Total shoulder arthroplasty  02/03/2012    Procedure: TOTAL SHOULDER ARTHROPLASTY;  Surgeon: [name], MD;  Location: [facility];  Service: Orthopedics;  Laterality: Left;  . Cardioversion N/A 09/29/2014     Procedure: CARDIOVERSION;  Surgeon: Josue Hector, MD;  Location: [facility] ENDOSCOPY;  Service: Cardiovascular;  Laterality: N/A;   Family History  Problem Relation Age of Onset  . Brain cancer Brother   . Lung cancer Mother   . Hypertension Father   . Diabetes Paternal Grandmother   . Heart attack Father   . Heart attack Brother    History  Substance Use Topics  . Smoking status: Former Smoker -- 2.00 packs/day for 30 years    Types: Cigarettes    Quit date: 12/05/1990  . Smokeless tobacco: Never Used  . Alcohol Use: No     Comment: does not drink now    Review of Systems ROS: Statement: All systems negative except as marked or noted in the HPI; Constitutional: Negative for fever and chills. ; ; Eyes: Negative for eye pain, redness and discharge. ; ; ENMT: Negative for ear pain, hoarseness, nasal congestion, sinus pressure and sore throat. ; ; Cardiovascular: Negative for chest pain, palpitations, diaphoresis, dyspnea and peripheral edema. ; ; Respiratory: Negative for cough, wheezing and stridor. ; ; Gastrointestinal: +abd pain, decreased appetite. Negative for nausea, vomiting, diarrhea, blood in stool, hematemesis, jaundice and rectal bleeding. . ; ;  Genitourinary: Negative for dysuria, flank pain and hematuria. ; ; Musculoskeletal: Negative for back pain and neck pain. Negative for swelling and trauma.; ; Skin: Negative for pruritus, rash, abrasions, blisters, bruising and skin lesion.; ; Neuro: Negative for headache, lightheadedness and neck stiffness. Negative for weakness, altered level of consciousness , altered mental status, extremity weakness, paresthesias, involuntary movement, seizure and syncope.      Allergies  Review of patient's allergies indicates no known allergies.  Home Medications   Prior to Admission medications   Medication Sig Start Date End Date Taking? Authorizing Provider  ALPRAZolam (XANAX) 0.25 MG tablet Take 0.25 mg by mouth 3 (three) times daily as needed  for anxiety or sleep.    Historical Provider, MD  apixaban (ELIQUIS) 5 MG TABS tablet Take 1 tablet (5 mg total) by mouth 2 (two) times daily. 12/12/14   Thompson Grayer, MD  Ascorbic Acid (VITAMIN C PO) Take 1,000 mg by mouth daily.     Historical Provider, MD  B Complex-C (SUPER B COMPLEX PO) Take 1 each by mouth daily.     Historical Provider, MD  Biotin 7500 MCG TABS Take 1 tablet by mouth daily.     Historical Provider, MD  calcium carbonate (OS-CAL) 600 MG TABS Take 600 mg by mouth daily with breakfast.    Historical Provider, MD  demeclocycline (DECLOMYCIN) 150 MG tablet Take 150 mg by mouth 2 (two) times daily.    Historical Provider, MD  dexamethasone (DECADRON) 4 MG tablet Take 4 mg by mouth See admin instructions. Take 1 tablet twice a day the day before, day of, and day after chemotherapy every three weeks.    Historical Provider, MD  diltiazem (CARDIZEM CD) 180 MG 24 hr capsule Take 1 capsule (180 mg total) by mouth daily at 12 noon. 01/09/15   Sherran Needs, NP  furosemide (LASIX) 40 MG tablet Take 1 tablet (40 mg total) by mouth daily. 05/11/15   Thompson Grayer, MD  HYDROcodone-acetaminophen (NORCO/VICODIN) 5-325 MG per tablet Take 1 tablet by mouth every 6 (six) hours as needed. 06/01/15   Ankit Nanavati, MD  KLOR-CON M20 20 MEQ tablet Take 20 mEq by mouth daily.  10/18/14   Historical Provider, MD  Lactobacillus (ACIDOPHILUS) CAPS capsule Take 1 capsule by mouth daily.    Historical Provider, MD  MAGNESIUM PO Take 400 mg by mouth daily.     Historical Provider, MD  metoprolol tartrate (LOPRESSOR) 25 MG tablet Take 0.5 tablets (12.5 mg total) by mouth 2 (two) times daily. 05/11/15   Thompson Grayer, MD  mirtazapine (REMERON) 15 MG tablet Take 1 tablet (15 mg total) by mouth at bedtime. 01/19/15   [name], MD  Multiple Vitamin (MULTIVITAMIN WITH MINERALS) TABS Take 1 tablet by mouth daily.    Historical Provider, MD  Nivolumab (OPDIVO IV) Inject as directed every 2 months    Historical  Provider, MD  Nutritional Supplements (ENSURE COMPLETE PO) Take 1 Can by mouth 2 (two) times daily.    Historical Provider, MD  Omega-3 Fatty Acids (FISH OIL PO) Take 1 tablet by mouth daily.    Historical Provider, MD  omeprazole (PRILOSEC) 20 MG capsule Take 20 mg by mouth daily as needed (heartburn).     Historical Provider, MD  ondansetron (ZOFRAN ODT) 8 MG disintegrating tablet Take 1 tablet (8 mg total) by mouth every 8 (eight) hours as needed for nausea. 06/01/15   Varney Biles, MD  sucralfate (CARAFATE) 1 G tablet Take 1 tablet (1 g total)  by mouth 4 (four) times daily -  with meals and at bedtime. 06/01/15   Varney Biles, MD  tamsulosin (FLOMAX) 0.4 MG CAPS capsule Take 0.4 mg by mouth daily.     Historical Provider, MD  thiamine (VITAMIN B-1) 100 MG tablet Take 1 tablet (100 mg total) by mouth daily. 09/11/13   Erick Colace, NP  vitamin E 400 UNIT capsule Take 400 Units by mouth daily.    Historical Provider, MD   BP 131/63 mmHg  Pulse 64  Temp(Src) 98.1 F (36.7 C) (Oral)  Resp 18  SpO2 97% Physical Exam  1100: Physical examination:  Nursing notes reviewed; Vital signs and O2 SAT reviewed;  Constitutional: Well developed, Well nourished, Well hydrated, In no acute distress; Head:  Normocephalic, atraumatic; Eyes: EOMI, PERRL, No scleral icterus; ENMT: Mouth and pharynx normal, Mucous membranes moist; Neck: Supple, Full range of motion, No lymphadenopathy; Cardiovascular: Regular rate and rhythm, No gallop; Respiratory: Breath sounds clear & equal bilaterally, No wheezes.  Speaking full sentences with ease, Normal respiratory effort/excursion; Chest: Nontender, Movement normal; Abdomen: Soft, +lateral RUQ tenderness to palp. No rebound or guarding. Nondistended, Normal bowel sounds; Genitourinary: No CVA tenderness; Extremities: Pulses normal, No tenderness, No edema, No calf edema or asymmetry.; Neuro: AA&Ox3, Major CN grossly intact.  Speech clear. No gross focal motor deficits in  extremities. Climbs on and off stretcher by himself with his cane.; Skin: Color normal, Warm, Dry.    ED Course  Procedures       EKG Interpretation None      MDM  MDM Reviewed: previous chart, nursing note and vitals Reviewed previous: labs and CT scan Interpretation: ultrasound and labs      Results for orders placed or performed during the hospital encounter of 06/11/15  CBC with Differential  Result Value Ref Range   WBC 6.3 4.0 - 10.5 K/uL   RBC 3.96 (L) 4.22 - 5.81 MIL/uL   Hemoglobin 12.7 (L) 13.0 - 17.0 g/dL   HCT 35.7 (L) 39.0 - 52.0 %   MCV 90.2 78.0 - 100.0 fL   MCH 32.1 26.0 - 34.0 pg   MCHC 35.6 30.0 - 36.0 g/dL   RDW 13.3 11.5 - 15.5 %   Platelets 340 150 - 400 K/uL   Neutrophils Relative % 67 43 - 77 %   Neutro Abs 4.2 1.7 - 7.7 K/uL   Lymphocytes Relative 14 12 - 46 %   Lymphs Abs 0.9 0.7 - 4.0 K/uL   Monocytes Relative 14 (H) 3 - 12 %   Monocytes Absolute 0.9 0.1 - 1.0 K/uL   Eosinophils Relative 5 0 - 5 %   Eosinophils Absolute 0.3 0.0 - 0.7 K/uL   Basophils Relative 0 0 - 1 %   Basophils Absolute 0.0 0.0 - 0.1 K/uL    Ct Chest W Contrast 06/09/2015   CLINICAL DATA:  Subsequent evaluation of the a 74 year old male with history of lung cancer diagnosed in November 2015. Right-sided chest pain, and pain during deep inspiration for 1 week. Chemotherapy and radiation therapy now complete. Oral chemotherapy ongoing. Prior smoker.  EXAM: CT CHEST, ABDOMEN, AND PELVIS WITH CONTRAST  TECHNIQUE: Multidetector CT imaging of the chest, abdomen and pelvis was performed following the standard protocol during bolus administration of intravenous contrast.  CONTRAST:  154m OMNIPAQUE IOHEXOL 300 MG/ML  SOLN  COMPARISON:  Chest CT 06/01/2015. CT of the chest, abdomen and pelvis 04/10/2015.  FINDINGS: CT CHEST FINDINGS  Mediastinum/Lymph Nodes: Heart size is normal.  Small amount of anterior pericardial fluid and/or thickening, similar to prior examinations, and the  unlikely to be of any hemodynamic significance at this time. No associated pericardial calcification. There is atherosclerosis of the thoracic aorta, the great vessels of the mediastinum and the coronary arteries, including calcified atherosclerotic plaque in the left main, left anterior descending and right coronary arteries. No pathologically enlarged mediastinal or hilar lymph nodes. Esophagus is unremarkable in appearance. No axillary lymphadenopathy.  Lungs/Pleura: Previously noted nodule in the medial aspect of the apex of the left upper lobe (image 9 of series 4) is stable to slightly decreased in size compared to the prior examination, currently measuring 13 x 7 mm. The other previously described 8 mm spiculated nodule in the left upper lobe is now a very ill-defined area of ground-glass attenuation (image 19 of series 4). Several other scattered areas of ground-glass attenuation are also noted in the upper lobes of the lungs bilaterally, which appear similar to the prior examination. Linear scarring in the left lower lobe is unchanged. New linear opacities and architectural distortion in the right lower lobe may reflect new areas of developing scarring, or may simply reflect subsegmental atelectasis. Mild diffuse bronchial wall thickening with mild centrilobular and paraseptal emphysema. No acute consolidative airspace disease. No pleural effusions.  Musculoskeletal/Soft Tissues: Asymmetric right-sided gynecomastia again noted, increased in size measuring up to 3.2 x 1.3 cm on today's examination (previously 2.5 x 1.2 cm on prior study 11/10/2014), and completely new compared to remote prior PET-CT 08/27/2013. Multiple old healed right-sided rib fractures. Status post left shoulder hemiarthroplasty. Chronic posttraumatic degenerative changes in the right shoulder redemonstrated. There are no aggressive appearing lytic or blastic lesions noted in the visualized portions of the skeleton.  CT ABDOMEN AND  PELVIS FINDINGS  Hepatobiliary: Several tiny sub cm low-attenuation liver lesions and sub cm hypervascular lesions are unchanged in size, number and distribution in the liver, likely to reflect a combination of small cysts and flash fill cavernous hemangiomas. No new aggressive appearing liver lesions are noted. No intra or extrahepatic biliary ductal dilatation. However, there does appear to be a 4 mm partially calcified stone in the distal common bile duct (image 71 of series 2). There are several partially calcified gallstones lying dependently in the gallbladder, and the gallbladder now appears moderately distended, with a thickened wall which measures up to 9 mm, with small amount of pericholecystic fluid and extensive pericholecystic stranding, strongly suggestive of acute cholecystitis at this time.  Pancreas: No pancreatic mass. No pancreatic ductal dilatation. No pancreatic or peripancreatic fluid or inflammatory changes.  Spleen: Unremarkable.  Adrenals/Urinary Tract: Bilateral perinephric stranding (nonspecific). Multifocal cortical thinning in the kidneys bilaterally, presumably chronic post infectious or inflammatory scarring. Sub cm low-attenuation lesions in the kidneys bilaterally are too small to definitively characterize, but are similar to prior studies, presumably small cysts. Mild bilateral adrenal nodularity, now difficult to accurately measure, but smaller than the prior examinations. No hydroureteronephrosis. Urinary bladder is remarkable for multiple small bladder wall diverticulae.  Stomach/Bowel: Normal appearance of the stomach. No pathologic dilatation of small bowel or colon. A few scattered colonic diverticulae are noted, without surrounding inflammatory changes to suggest an acute diverticulitis at this time.  Vascular/Lymphatic: Extensive atherosclerosis throughout the abdominal and pelvic vasculature, without evidence of aneurysm or dissection.  Reproductive: Prostate gland and  seminal vesicles are unremarkable in appearance.  Other: Trace volume of ascites.  No pneumoperitoneum.  Musculoskeletal: There are no aggressive appearing lytic or blastic lesions noted in  the visualized portions of the skeleton. Extensive multilevel degenerative disc disease and spondylosis throughout the lumbar spine.  IMPRESSION: 1. Findings are highly concerning for acute cholecystitis. This could be confirmed with right upper quadrant ultrasound if clinically appropriate. Surgical consultation is also suggested. 2. Generally stable (or slightly improved) appearance of multiple pulmonary nodules (most of which are ground-glass attenuation) in the lungs bilaterally, as above. Continued attention on followup studies is recommended to ensure continued stability. 3. Slow progressive enlargement of asymmetric gynecomastia. This could represent a soft tissue metastasis, or primary male breast cancer, and further evaluation with breast ultrasound and/or mammography is suggested if clinically appropriate. 4. Additional incidental findings, as above, generally similar prior examinations. These results will be called to the ordering clinician or representative by the Radiologist Assistant, and communication documented in the PACS or zVision Dashboard.   Electronically Signed   By: Vinnie Langton M.D.   On: 06/09/2015 15:45   Ct Abdomen Pelvis W Contrast 06/09/2015   CLINICAL DATA:  Subsequent evaluation of the a 74 year old male with history of lung cancer diagnosed in November 2015. Right-sided chest pain, and pain during deep inspiration for 1 week. Chemotherapy and radiation therapy now complete. Oral chemotherapy ongoing. Prior smoker.  EXAM: CT CHEST, ABDOMEN, AND PELVIS WITH CONTRAST  TECHNIQUE: Multidetector CT imaging of the chest, abdomen and pelvis was performed following the standard protocol during bolus administration of intravenous contrast.  CONTRAST:  152m OMNIPAQUE IOHEXOL 300 MG/ML  SOLN   COMPARISON:  Chest CT 06/01/2015. CT of the chest, abdomen and pelvis 04/10/2015.  FINDINGS: CT CHEST FINDINGS  Mediastinum/Lymph Nodes: Heart size is normal. Small amount of anterior pericardial fluid and/or thickening, similar to prior examinations, and the unlikely to be of any hemodynamic significance at this time. No associated pericardial calcification. There is atherosclerosis of the thoracic aorta, the great vessels of the mediastinum and the coronary arteries, including calcified atherosclerotic plaque in the left main, left anterior descending and right coronary arteries. No pathologically enlarged mediastinal or hilar lymph nodes. Esophagus is unremarkable in appearance. No axillary lymphadenopathy.  Lungs/Pleura: Previously noted nodule in the medial aspect of the apex of the left upper lobe (image 9 of series 4) is stable to slightly decreased in size compared to the prior examination, currently measuring 13 x 7 mm. The other previously described 8 mm spiculated nodule in the left upper lobe is now a very ill-defined area of ground-glass attenuation (image 19 of series 4). Several other scattered areas of ground-glass attenuation are also noted in the upper lobes of the lungs bilaterally, which appear similar to the prior examination. Linear scarring in the left lower lobe is unchanged. New linear opacities and architectural distortion in the right lower lobe may reflect new areas of developing scarring, or may simply reflect subsegmental atelectasis. Mild diffuse bronchial wall thickening with mild centrilobular and paraseptal emphysema. No acute consolidative airspace disease. No pleural effusions.  Musculoskeletal/Soft Tissues: Asymmetric right-sided gynecomastia again noted, increased in size measuring up to 3.2 x 1.3 cm on today's examination (previously 2.5 x 1.2 cm on prior study 11/10/2014), and completely new compared to remote prior PET-CT 08/27/2013. Multiple old healed right-sided rib  fractures. Status post left shoulder hemiarthroplasty. Chronic posttraumatic degenerative changes in the right shoulder redemonstrated. There are no aggressive appearing lytic or blastic lesions noted in the visualized portions of the skeleton.  CT ABDOMEN AND PELVIS FINDINGS  Hepatobiliary: Several tiny sub cm low-attenuation liver lesions and sub cm hypervascular lesions are unchanged in  size, number and distribution in the liver, likely to reflect a combination of small cysts and flash fill cavernous hemangiomas. No new aggressive appearing liver lesions are noted. No intra or extrahepatic biliary ductal dilatation. However, there does appear to be a 4 mm partially calcified stone in the distal common bile duct (image 71 of series 2). There are several partially calcified gallstones lying dependently in the gallbladder, and the gallbladder now appears moderately distended, with a thickened wall which measures up to 9 mm, with small amount of pericholecystic fluid and extensive pericholecystic stranding, strongly suggestive of acute cholecystitis at this time.  Pancreas: No pancreatic mass. No pancreatic ductal dilatation. No pancreatic or peripancreatic fluid or inflammatory changes.  Spleen: Unremarkable.  Adrenals/Urinary Tract: Bilateral perinephric stranding (nonspecific). Multifocal cortical thinning in the kidneys bilaterally, presumably chronic post infectious or inflammatory scarring. Sub cm low-attenuation lesions in the kidneys bilaterally are too small to definitively characterize, but are similar to prior studies, presumably small cysts. Mild bilateral adrenal nodularity, now difficult to accurately measure, but smaller than the prior examinations. No hydroureteronephrosis. Urinary bladder is remarkable for multiple small bladder wall diverticulae.  Stomach/Bowel: Normal appearance of the stomach. No pathologic dilatation of small bowel or colon. A few scattered colonic diverticulae are noted, without  surrounding inflammatory changes to suggest an acute diverticulitis at this time.  Vascular/Lymphatic: Extensive atherosclerosis throughout the abdominal and pelvic vasculature, without evidence of aneurysm or dissection.  Reproductive: Prostate gland and seminal vesicles are unremarkable in appearance.  Other: Trace volume of ascites.  No pneumoperitoneum.  Musculoskeletal: There are no aggressive appearing lytic or blastic lesions noted in the visualized portions of the skeleton. Extensive multilevel degenerative disc disease and spondylosis throughout the lumbar spine.  IMPRESSION: 1. Findings are highly concerning for acute cholecystitis. This could be confirmed with right upper quadrant ultrasound if clinically appropriate. Surgical consultation is also suggested. 2. Generally stable (or slightly improved) appearance of multiple pulmonary nodules (most of which are ground-glass attenuation) in the lungs bilaterally, as above. Continued attention on followup studies is recommended to ensure continued stability. 3. Slow progressive enlargement of asymmetric gynecomastia. This could represent a soft tissue metastasis, or primary male breast cancer, and further evaluation with breast ultrasound and/or mammography is suggested if clinically appropriate. 4. Additional incidental findings, as above, generally similar prior examinations. These results will be called to the ordering clinician or representative by the Radiologist Assistant, and communication documented in the PACS or zVision Dashboard.   Electronically Signed   By: Vinnie Langton M.D.   On: 06/09/2015 15:45    Results for orders placed or performed during the hospital encounter of 06/11/15  Comprehensive metabolic panel  Result Value Ref Range   Sodium 128 (L) 135 - 145 mmol/L   Potassium 3.5 3.5 - 5.1 mmol/L   Chloride 88 (L) 101 - 111 mmol/L   CO2 30 22 - 32 mmol/L   Glucose, Bld 108 (H) 65 - 99 mg/dL   BUN 12 6 - 20 mg/dL   Creatinine,  Ser 0.79 0.61 - 1.24 mg/dL   Calcium 8.7 (L) 8.9 - 10.3 mg/dL   Total Protein 6.8 6.5 - 8.1 g/dL   Albumin 3.7 3.5 - 5.0 g/dL   AST 24 15 - 41 U/L   ALT 17 17 - 63 U/L   Alkaline Phosphatase 84 38 - 126 U/L   Total Bilirubin 1.0 0.3 - 1.2 mg/dL   GFR calc non Af Amer >60 >60 mL/min   GFR calc Af  Amer >60 >60 mL/min   Anion gap 10 5 - 15  Lipase, blood  Result Value Ref Range   Lipase <10 (L) 22 - 51 U/L  CBC with Differential  Result Value Ref Range   WBC 6.3 4.0 - 10.5 K/uL   RBC 3.96 (L) 4.22 - 5.81 MIL/uL   Hemoglobin 12.7 (L) 13.0 - 17.0 g/dL   HCT 35.7 (L) 39.0 - 52.0 %   MCV 90.2 78.0 - 100.0 fL   MCH 32.1 26.0 - 34.0 pg   MCHC 35.6 30.0 - 36.0 g/dL   RDW 13.3 11.5 - 15.5 %   Platelets 340 150 - 400 K/uL   Neutrophils Relative % 67 43 - 77 %   Neutro Abs 4.2 1.7 - 7.7 K/uL   Lymphocytes Relative 14 12 - 46 %   Lymphs Abs 0.9 0.7 - 4.0 K/uL   Monocytes Relative 14 (H) 3 - 12 %   Monocytes Absolute 0.9 0.1 - 1.0 K/uL   Eosinophils Relative 5 0 - 5 %   Eosinophils Absolute 0.3 0.0 - 0.7 K/uL   Basophils Relative 0 0 - 1 %   Basophils Absolute 0.0 0.0 - 0.1 K/uL   US Abdomen Complete 06/11/2015   CLINICAL DATA:  Abnormal gallbladder on CT  EXAM: ULTRASOUND ABDOMEN COMPLETE  COMPARISON:  CT abdomen pelvis 06/09/2015  FINDINGS: Gallbladder: Distended gallbladder containing gallstones and extensive sludge. Gallbladder wall is thickened at 5.4 mm. Negative sonographic Murphy sign.  There is focal soft tissue thickening of the lateral wall of the gallbladder measuring 14 mm in thickness raising the possibility of a mass lesion. This is difficult difficult to confirm on the CT scan.  Common bile duct: Diameter: 3.6 mm  Liver: 9 mm cyst.  No other liver mass lesion.  IVC: No abnormality visualized.  Pancreas: Not visualized  Spleen: Size and appearance within normal limits.  Right Kidney: Length: 10.7 cm. Echogenicity within normal limits. No mass or hydronephrosis visualized.  Left  Kidney: Length: 10.9 cm. Echogenicity within normal limits. No mass or hydronephrosis visualized.  Abdominal aorta: No aneurysm visualized.  Other findings: None.  IMPRESSION: Distended gallbladder containing gallstones and sludge. Gallbladder wall thickening. The patient is nontender over the gallbladder. Possible acute cholecystitis. The patient currently has a normal white blood count. There is a possible mass the lateral gallbladder which could be due to carcinoma versus sludge or inflammation.   Electronically Signed   By: Franchot Gallo M.D.   On: 06/11/2015 12:29    1255:  Chronic hyponatremia. LFT's and WBC counts normal. Remains afebrile. T/C to General Surgery, case discussed, including:  HPI, pertinent PM/SHx, VS/PE, dx testing, ED course and treatment:  Agreeable to come to ED for consult.   1410:  General Surgery requests to admit to medicine service. IV abx ordered. T/C to [facility] Dr. Wendee Beavers, case discussed, including:  HPI, pertinent PM/SHx, VS/PE, dx testing, ED course and treatment:  States he will talk with surgical service regarding admission.   1455:  [facility] Dr. Wendee Beavers will consult, General Surgery will admit.      Francine Graven, DO 06/12/15 1145

## 2015-06-11 NOTE — Consult Note (Signed)
[facility] Medical Consultation  Jeromiah Ohalloran NLG:[id number] DOB: [birth year] DOA: 06/11/2015 PCP: [name], MD   Requesting physician: Dr. 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 Consult pharmacy for heparin drip while off of liquids. This can be discontinued 12 hours prior to procedure  I will followup again tomorrow. Please contact me if I can be of assistance in the meanwhile. Thank you for this consultation.  Chief Complaint: RUQ abdominal pain  HPI:  Patient is a 74 year old with history of metastatic non-small cell lung cancer followed by Dr. [name] as outpatient. Has developed right upper quadrant abdominal discomfort. Has not been able to eat well for the last 5 days. Presented to his oncologist for further evaluation. CT scan showed findings which were concerning for acute cholecystitis. Ultrasound of right upper quadrant obtained which reported distended gallbladder containing gallstones and sludge. Gen. surgery consultacted and we were consulted for medical management of known medical conditions  Review of Systems:  12 point review of systems negative unless otherwise mentioned above  Past Medical History  Diagnosis Date  . Hypertension   . GERD (gastroesophageal reflux disease)   . Alcohol abuse   . 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 Low sodium   . Persistent atrial fibrillation 04/11/14    chads2vasc score is 2  . Hypotension   . Atrial  fibrillation with RVR   . HCAP (healthcare-associated pneumonia)   . Adrenal hemorrhage   . BPH (benign prostatic hyperplasia)   . Hyponatremia   . Anemia of chronic disease   . Fall   . Alcohol intoxication   . Pulmonary nodules   . Adrenal mass   . Atrial flutter by electrocardiogram   . 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 Alcoholism in remission   . Radiation 12/02/14-12/16/14    Left adrenal gland 30 Gy in 10 fractions  . Cancer     small cell/lung  . Neoplastic malignant related fatigue    Past Surgical History  Procedure Laterality Date  . Hemorrhoid surgery    . Ankle arthrodesis  1995    rt fx-hardware in  . Tonsillectomy    . Colonoscopy    . Excision / curettage bone cyst finger  2005    rt thumb  . Total shoulder arthroplasty  02/03/2012    Procedure: TOTAL SHOULDER ARTHROPLASTY;  Surgeon: [name], MD;  Location: [facility];  Service: Orthopedics;  Laterality: Left;  . Cardioversion N/A 09/29/2014    Procedure: CARDIOVERSION;  Surgeon: Josue Hector, MD;  Location[facility] ENDOSCOPY;  Service: Cardiovascular;  Laterality: N/A;   Social History:  reports that he quit smoking about 24 years ago. His smoking use included Cigarettes. He has a 60 pack-year smoking history. He has never used smokeless tobacco. He reports that he does not drink alcohol or use illicit drugs.  Allergies  Allergen Reactions  . Aspirin Other (See Comments)    Pt can not have Asprin due to medical conditions according to spouse   Family History  Problem Relation Age  of Onset  . Brain cancer Brother   . Lung cancer Mother   . Hypertension Father   . Diabetes Paternal Grandmother   . Heart attack Father   . Heart attack Brother     Prior to Admission medications   Medication Sig Start Date End Date Taking? Authorizing Provider  ALPRAZolam (XANAX) 0.25 MG tablet Take 0.25 mg by mouth 3 (three) times daily as needed for anxiety or sleep.   Yes  Historical Provider, MD  apixaban (ELIQUIS) 5 MG TABS tablet Take 1 tablet (5 mg total) by mouth 2 (two) times daily. 12/12/14  Yes Thompson Grayer, MD  Ascorbic Acid (VITAMIN C PO) Take 1,000 mg by mouth daily.    Yes Historical Provider, MD  B Complex-C (SUPER B COMPLEX PO) Take 1 each by mouth daily.    Yes Historical Provider, MD  Biotin 7500 MCG TABS Take 1 tablet by mouth daily.    Yes Historical Provider, MD  calcium carbonate (OS-CAL) 600 MG TABS Take 600 mg by mouth daily with breakfast.   Yes Historical Provider, MD  demeclocycline (DECLOMYCIN) 150 MG tablet Take 150 mg by mouth 2 (two) times daily.   Yes Historical Provider, MD  diltiazem (CARDIZEM CD) 180 MG 24 hr capsule Take 1 capsule (180 mg total) by mouth daily at 12 noon. 01/09/15  Yes Sherran Needs, NP  furosemide (LASIX) 40 MG tablet Take 1 tablet (40 mg total) by mouth daily. 05/11/15  Yes Thompson Grayer, MD  HYDROcodone-acetaminophen (NORCO/VICODIN) 5-325 MG per tablet Take 1 tablet by mouth every 6 (six) hours as needed. Patient taking differently: Take 1 tablet by mouth every 6 (six) hours as needed for severe pain.  06/01/15  Yes Ankit Nanavati, MD  KLOR-CON M20 20 MEQ tablet Take 20 mEq by mouth daily.  10/18/14  Yes Historical Provider, MD  Lactobacillus (ACIDOPHILUS) CAPS capsule Take 1 capsule by mouth daily.   Yes Historical Provider, MD  MAGNESIUM PO Take 400 mg by mouth daily.    Yes Historical Provider, MD  metoprolol tartrate (LOPRESSOR) 25 MG tablet Take 0.5 tablets (12.5 mg total) by mouth 2 (two) times daily. Patient taking differently: Take 6.25 mg by mouth 2 (two) times daily.  05/11/15  Yes Thompson Grayer, MD  mirtazapine (REMERON) 15 MG tablet Take 1 tablet (15 mg total) by mouth at bedtime. Patient taking differently: Take 15 mg by mouth at bedtime as needed (sleep).  01/19/15  Yes [name], MD  Multiple Vitamin (MULTIVITAMIN WITH MINERALS) TABS Take 1 tablet by mouth daily.   Yes Historical Provider, MD   Nivolumab (OPDIVO IV) Inject as directed every 2 weeks   Yes Historical Provider, MD  Nutritional Supplements (ENSURE COMPLETE PO) Take 1 Can by mouth 2 (two) times daily.   Yes Historical Provider, MD  Omega-3 Fatty Acids (FISH OIL PO) Take 1 tablet by mouth daily.   Yes Historical Provider, MD  ondansetron (ZOFRAN ODT) 8 MG disintegrating tablet Take 1 tablet (8 mg total) by mouth every 8 (eight) hours as needed for nausea. 06/01/15  Yes Varney Biles, MD  pantoprazole (PROTONIX) 40 MG tablet Take 40 mg by mouth every morning. 06/02/15  Yes Historical Provider, MD  tamsulosin (FLOMAX) 0.4 MG CAPS capsule Take 0.4 mg by mouth daily.    Yes Historical Provider, MD  thiamine (VITAMIN B-1) 100 MG tablet Take 1 tablet (100 mg total) by mouth daily. 09/11/13  Yes Erick Colace, NP  vitamin E 400 UNIT capsule Take 400  Units by mouth daily.   Yes Historical Provider, MD  sucralfate (CARAFATE) 1 G tablet Take 1 tablet (1 g total) by mouth 4 (four) times daily -  with meals and at bedtime. Patient not taking: Reported on 06/11/2015 06/01/15   Varney Biles, MD   Physical Exam: Blood pressure 131/63, pulse 64, temperature 98.1 F (36.7 C), temperature source Oral, resp. rate 18, SpO2 97 %. Filed Vitals:   06/11/15 1114  BP: 131/63  Pulse: 64  Temp:   Resp: 18     General:  Pt in nad, alert and awake  Eyes: EOMI, non icteric  ENT: normal exterior appearance, MMM  Neck: no masses on visual examination  Cardiovascular: rrr, no rubs  Respiratory: cta bl, no wheezes  Abdomen: soft, NT, ND  Skin: no cyanosis or clubbing  Musculoskeletal: equal tone BL  Psychiatric: normal mood and flat affect  Neurologic: Answers questions appropriately moves extremities equally  Labs on Admission:  Basic Metabolic Panel:  Recent Labs Lab 06/10/15 1241 06/11/15 1132  NA 128* 128*  K 3.3* 3.5  CL  --  88*  CO2 27 30  GLUCOSE 117 108*  BUN 12.9 12  CREATININE 0.8 0.79  CALCIUM 9.2 8.7*    Liver Function Tests:  Recent Labs Lab 06/10/15 1241 06/11/15 1132  AST 18 24  ALT 16 17  ALKPHOS 94 84  BILITOT 0.74 1.0  PROT 6.5 6.8  ALBUMIN 3.4* 3.7    Recent Labs Lab 06/11/15 1132  LIPASE <10*   No results for input(s): AMMONIA in the last 168 hours. CBC:  Recent Labs Lab 06/10/15 1240 06/11/15 1132  WBC 7.5 6.3  NEUTROABS 6.0 4.2  HGB 13.3 12.7*  HCT 38.3* 35.7*  MCV 93.6 90.2  PLT 316 340   Cardiac Enzymes: No results for input(s): CKTOTAL, CKMB, CKMBINDEX, TROPONINI in the last 168 hours. BNP: Invalid input(s): POCBNP CBG: No results for input(s): GLUCAP in the last 168 hours.  Radiological Exams on Admission: US Abdomen Complete  06/11/2015   CLINICAL DATA:  Abnormal gallbladder on CT  EXAM: ULTRASOUND ABDOMEN COMPLETE  COMPARISON:  CT abdomen pelvis 06/09/2015  FINDINGS: Gallbladder: Distended gallbladder containing gallstones and extensive sludge. Gallbladder wall is thickened at 5.4 mm. Negative sonographic Murphy sign.  There is focal soft tissue thickening of the lateral wall of the gallbladder measuring 14 mm in thickness raising the possibility of a mass lesion. This is difficult difficult to confirm on the CT scan.  Common bile duct: Diameter: 3.6 mm  Liver: 9 mm cyst.  No other liver mass lesion.  IVC: No abnormality visualized.  Pancreas: Not visualized  Spleen: Size and appearance within normal limits.  Right Kidney: Length: 10.7 cm. Echogenicity within normal limits. No mass or hydronephrosis visualized.  Left Kidney: Length: 10.9 cm. Echogenicity within normal limits. No mass or hydronephrosis visualized.  Abdominal aorta: No aneurysm visualized.  Other findings: None.  IMPRESSION: Distended gallbladder containing gallstones and sludge. Gallbladder wall thickening. The patient is nontender over the gallbladder. Possible acute cholecystitis. The patient currently has a normal white blood count. There is a possible mass the lateral gallbladder which  could be due to carcinoma versus sludge or inflammation.   Electronically Signed   By: Franchot Gallo M.D.   On: 06/11/2015 12:29    EKG: Independently reviewed. Sinus rhythm with no ST elevations or depressions  Time spent: > 35 minutes  Velvet Bathe [facility] Pager [phone]  If 7PM-7AM, please contact night-coverage [email or web address] Password [email or web address]  06/11/2015, 3:28 PM

## 2015-06-12 ENCOUNTER — Encounter (HOSPITAL_COMMUNITY): Payer: Self-pay | Admitting: Radiology

## 2015-06-12 DIAGNOSIS — I119 Hypertensive heart disease without heart failure: Secondary | ICD-10-CM

## 2015-06-12 DIAGNOSIS — K810 Acute cholecystitis: Secondary | ICD-10-CM

## 2015-06-12 LAB — COMPREHENSIVE METABOLIC PANEL
ALT: 19 U/L (ref 17–63)
ANION GAP: 11 (ref 5–15)
AST: 23 U/L (ref 15–41)
Albumin: 3.4 g/dL — ABNORMAL LOW (ref 3.5–5.0)
Alkaline Phosphatase: 81 U/L (ref 38–126)
BILIRUBIN TOTAL: 1.1 mg/dL (ref 0.3–1.2)
BUN: 12 mg/dL (ref 6–20)
CO2: 24 mmol/L (ref 22–32)
CREATININE: 0.78 mg/dL (ref 0.61–1.24)
Calcium: 8.6 mg/dL — ABNORMAL LOW (ref 8.9–10.3)
Chloride: 94 mmol/L — ABNORMAL LOW (ref 101–111)
GFR calc non Af Amer: >60 mL/min (ref >60)
GLUCOSE: 77 mg/dL (ref 65–99)
POTASSIUM: 3.4 mmol/L — AB (ref 3.5–5.1)
SODIUM: 129 mmol/L — AB (ref 135–145)
Total Protein: 6.1 g/dL — ABNORMAL LOW (ref 6.5–8.1)

## 2015-06-12 LAB — APTT
APTT: 104 s — AB (ref 24–37)
aPTT: 70 seconds — ABNORMAL HIGH (ref 24–37)

## 2015-06-12 LAB — CBC
HEMATOCRIT: 35.2 % — AB (ref 39.0–52.0)
HEMATOCRIT: 33.7 % — AB (ref 39.0–52.0)
Hemoglobin: 12.6 g/dL — ABNORMAL LOW (ref 13.0–17.0)
Hemoglobin: 12 g/dL — ABNORMAL LOW (ref 13.0–17.0)
MCH: 32.2 pg (ref 26.0–34.0)
MCH: 32.3 pg (ref 26.0–34.0)
MCHC: 35.8 g/dL (ref 30.0–36.0)
MCHC: 35.6 g/dL (ref 30.0–36.0)
MCV: 90 fL (ref 78.0–100.0)
MCV: 90.6 fL (ref 78.0–100.0)
PLATELETS: 340 10*3/uL (ref 150–400)
Platelets: 363 10*3/uL (ref 150–400)
RBC: 3.91 MIL/uL — ABNORMAL LOW (ref 4.22–5.81)
RBC: 3.72 MIL/uL — ABNORMAL LOW (ref 4.22–5.81)
RDW: 13.4 % (ref 11.5–15.5)
RDW: 13.4 % (ref 11.5–15.5)
WBC: 5.5 10*3/uL (ref 4.0–10.5)
WBC: 4.3 10*3/uL (ref 4.0–10.5)

## 2015-06-12 LAB — HEPARIN LEVEL (UNFRACTIONATED)
HEPARIN UNFRACTIONATED: 2.06 [IU]/mL — AB (ref 0.30–0.70)
HEPARIN UNFRACTIONATED: 1.7 [IU]/mL — AB (ref 0.30–0.70)
Heparin Unfractionated: 0.74 IU/mL — ABNORMAL HIGH (ref 0.30–0.70)

## 2015-06-12 LAB — MAGNESIUM: Magnesium: 1.5 mg/dL — ABNORMAL LOW (ref 1.7–2.4)

## 2015-06-12 LAB — PROTIME-INR
INR: 1.26 (ref 0.00–1.49)
PROTHROMBIN TIME: 15.9 s — AB (ref 11.6–15.2)

## 2015-06-12 MED ORDER — HEPARIN (PORCINE) IN NACL 100-0.45 UNIT/ML-% IJ SOLN
900.0000 [IU]/h | INTRAMUSCULAR | Status: AC
  Administered 2015-06-12 (×2): 900 [IU]/h via INTRAVENOUS
  Filled 2015-06-12: qty 250

## 2015-06-12 MED ORDER — POTASSIUM CHLORIDE CRYS ER 20 MEQ PO TBCR
40.0000 meq | EXTENDED_RELEASE_TABLET | Freq: Once | ORAL | Status: AC
  Administered 2015-06-12: 40 meq via ORAL
  Filled 2015-06-12: qty 2

## 2015-06-12 MED ORDER — MAGNESIUM SULFATE 2 GM/50ML IV SOLN
2.0000 g | Freq: Once | INTRAVENOUS | Status: AC
  Administered 2015-06-12: 2 g via INTRAVENOUS
  Filled 2015-06-12: qty 50

## 2015-06-12 NOTE — Progress Notes (Signed)
Patient ID: Anthony Curry, male   DOB: 11-30-1941, 74 y.o.   MRN: [id number]     Anthony Curry      [address] [place], [place] [address]    Phone: [phone] FAX: [phone]     Subjective: Afebrile.  Normal LFTs, CBC.  HIDA noted, but still high suspicion for cholecystitis.    Objective:  Vital signs:  Filed Vitals:   06/11/15 1848 06/11/15 2123 06/11/15 2300 06/12/15 0133  BP: 126/65 157/66  120/63  Pulse: 69 68  77  Temp: 98.1 F (36.7 C) 97.9 F (36.6 C)  98.7 F (37.1 C)  TempSrc: Oral Oral  Oral  Resp:  20  16  Height:   5' 8"  (1.727 m)   SpO2: 99% 99%  94%    Last BM Date: 06/10/15  Intake/Output   Yesterday:    This shift:  Total I/O In: 360 [P.O.:360] Out: -    Physical Exam: General: Pt awake/alert/oriented x4 in no acute distress Chest: cta.  No chest wall pain w good excursion CV:  Pulses intact.  Regular rhythm MS: Normal AROM mjr joints.  No obvious deformity Abdomen: Soft.  Nondistended.  No evidence of peritonitis.  No incarcerated hernias. Ext:  SCDs BLE.  No mjr edema.  No cyanosis Skin: No petechiae / purpura   Problem List:   Active Problems:   Hypertensive cardiovascular disease   BPH (benign prostatic hyperplasia)   GERD (gastroesophageal reflux disease)   Lung cancer   Acute cholecystitis   Atrial fibrillation    Results:   Labs: Results for orders placed or performed during the hospital encounter of 06/11/15 (from the past 48 hour(s))  Comprehensive metabolic panel     Status: Abnormal   Collection Time: 06/11/15 11:32 AM  Result Value Ref Range   Sodium 128 (L) 135 - 145 mmol/L   Potassium 3.5 3.5 - 5.1 mmol/L   Chloride 88 (L) 101 - 111 mmol/L   CO2 30 22 - 32 mmol/L   Glucose, Bld 108 (H) 65 - 99 mg/dL   BUN 12 6 - 20 mg/dL   Creatinine, Ser 0.79 0.61 - 1.24 mg/dL   Calcium 8.7 (L) 8.9 - 10.3 mg/dL   Total Protein 6.8 6.5 - 8.1 g/dL   Albumin 3.7 3.5 - 5.0 g/dL    AST 24 15 - 41 U/L   ALT 17 17 - 63 U/L   Alkaline Phosphatase 84 38 - 126 U/L   Total Bilirubin 1.0 0.3 - 1.2 mg/dL   GFR calc non Af Amer >60 >60 mL/min   GFR calc Af Amer >60 >60 mL/min    Comment: (NOTE) The eGFR has been calculated using the CKD EPI equation. This calculation has not been validated in all clinical situations. eGFR's persistently <60 mL/min signify possible Chronic Kidney Disease.    Anion gap 10 5 - 15  Lipase, blood     Status: Abnormal   Collection Time: 06/11/15 11:32 AM  Result Value Ref Range   Lipase <10 (L) 22 - 51 U/L  CBC with Differential     Status: Abnormal   Collection Time: 06/11/15 11:32 AM  Result Value Ref Range   WBC 6.3 4.0 - 10.5 K/uL   RBC 3.96 (L) 4.22 - 5.81 MIL/uL   Hemoglobin 12.7 (L) 13.0 - 17.0 g/dL   HCT 35.7 (L) 39.0 - 52.0 %   MCV 90.2 78.0 - 100.0 fL   MCH 32.1 26.0 -  34.0 pg   MCHC 35.6 30.0 - 36.0 g/dL   RDW 13.3 11.5 - 15.5 %   Platelets 340 150 - 400 K/uL   Neutrophils Relative % 67 43 - 77 %   Neutro Abs 4.2 1.7 - 7.7 K/uL   Lymphocytes Relative 14 12 - 46 %   Lymphs Abs 0.9 0.7 - 4.0 K/uL   Monocytes Relative 14 (H) 3 - 12 %   Monocytes Absolute 0.9 0.1 - 1.0 K/uL   Eosinophils Relative 5 0 - 5 %   Eosinophils Absolute 0.3 0.0 - 0.7 K/uL   Basophils Relative 0 0 - 1 %   Basophils Absolute 0.0 0.0 - 0.1 K/uL  APTT     Status: Abnormal   Collection Time: 06/11/15  7:05 PM  Result Value Ref Range   aPTT 45 (H) 24 - 37 seconds    Comment:        IF BASELINE aPTT IS ELEVATED, SUGGEST PATIENT RISK ASSESSMENT BE USED TO DETERMINE APPROPRIATE ANTICOAGULANT THERAPY.   Heparin level (unfractionated)     Status: Abnormal   Collection Time: 06/11/15  7:05 PM  Result Value Ref Range   Heparin Unfractionated >2.20 (H) 0.30 - 0.70 IU/mL    Comment: RESULTS CONFIRMED BY MANUAL DILUTION CALLED TO CHELSEA RN @ 2114        IF HEPARIN RESULTS ARE BELOW EXPECTED VALUES, AND PATIENT DOSAGE HAS BEEN CONFIRMED, SUGGEST  FOLLOW UP TESTING OF ANTITHROMBIN III LEVELS. CORRECTED ON 07/07 AT 2119: PREVIOUSLY REPORTED AS >2.20        IF HEPARIN RESULTS ARE BELOW EXPECTED VALUES, AND PATIENT DOSAGE HAS BEEN CONFIRMED, SUGGEST FOLLOW UP TESTING OF ANTITHROMBIN III LEVELS. CALLED TO Marshall RN @ 2114, CORRECTED ON 07/07 AT  2113: PREVIOUSLY REPORTED AS 3.04        IF HEPARIN RESULTS ARE BELOW EXPECTED VALUES, AND PATIENT DOSAGE HAS BEEN CONFIRMED, SUGGEST FOLLOW UP TESTING OF ANTITHROMBIN III LEVELS. RESULTS CONFIRMED BY MANUAL DILUTION   Comprehensive metabolic panel     Status: Abnormal   Collection Time: 06/12/15  5:19 AM  Result Value Ref Range   Sodium 129 (L) 135 - 145 mmol/L   Potassium 3.4 (L) 3.5 - 5.1 mmol/L   Chloride 94 (L) 101 - 111 mmol/L   CO2 24 22 - 32 mmol/L   Glucose, Bld 77 65 - 99 mg/dL   BUN 12 6 - 20 mg/dL   Creatinine, Ser 0.78 0.61 - 1.24 mg/dL   Calcium 8.6 (L) 8.9 - 10.3 mg/dL   Total Protein 6.1 (L) 6.5 - 8.1 g/dL   Albumin 3.4 (L) 3.5 - 5.0 g/dL   AST 23 15 - 41 U/L   ALT 19 17 - 63 U/L   Alkaline Phosphatase 81 38 - 126 U/L   Total Bilirubin 1.1 0.3 - 1.2 mg/dL   GFR calc non Af Amer >60 >60 mL/min   GFR calc Af Amer >60 >60 mL/min    Comment: (NOTE) The eGFR has been calculated using the CKD EPI equation. This calculation has not been validated in all clinical situations. eGFR's persistently <60 mL/min signify possible Chronic Kidney Disease.    Anion gap 11 5 - 15  CBC     Status: Abnormal   Collection Time: 06/12/15  5:19 AM  Result Value Ref Range   WBC 5.5 4.0 - 10.5 K/uL   RBC 3.91 (L) 4.22 - 5.81 MIL/uL   Hemoglobin 12.6 (L) 13.0 - 17.0 g/dL   HCT 35.2 (L)  39.0 - 52.0 %   MCV 90.0 78.0 - 100.0 fL   MCH 32.2 26.0 - 34.0 pg   MCHC 35.8 30.0 - 36.0 g/dL   RDW 13.4 11.5 - 15.5 %   Platelets 340 150 - 400 K/uL  Protime-INR     Status: Abnormal   Collection Time: 06/12/15  5:19 AM  Result Value Ref Range   Prothrombin Time 15.9 (H) 11.6 - 15.2 seconds   INR 1.26  0.00 - 1.49  Heparin level (unfractionated)     Status: Abnormal   Collection Time: 06/12/15  5:29 AM  Result Value Ref Range   Heparin Unfractionated 2.06 (H) 0.30 - 0.70 IU/mL    Comment: RESULTS CONFIRMED BY MANUAL DILUTION        IF HEPARIN RESULTS ARE BELOW EXPECTED VALUES, AND PATIENT DOSAGE HAS BEEN CONFIRMED, SUGGEST FOLLOW UP TESTING OF ANTITHROMBIN III LEVELS.   APTT     Status: Abnormal   Collection Time: 06/12/15  5:45 AM  Result Value Ref Range   aPTT 70 (H) 24 - 37 seconds    Comment:        IF BASELINE aPTT IS ELEVATED, SUGGEST PATIENT RISK ASSESSMENT BE USED TO DETERMINE APPROPRIATE ANTICOAGULANT THERAPY.     Imaging / Studies: Nm Hepatobiliary Liver Func  06/11/2015   CLINICAL DATA:  Right upper quadrant abdominal pain, nausea and vomiting intermittently for the past 2 weeks. Cholelithiasis and gallbladder sludge and possible gallbladder mass at ultrasound today.  EXAM: NUCLEAR MEDICINE HEPATOBILIARY IMAGING  TECHNIQUE: Sequential images of the abdomen were obtained out to 60 minutes following intravenous administration of radiopharmaceutical.  RADIOPHARMACEUTICALS:  5.1 mCi Tc-36m Choletec IV  COMPARISON:  Abdomen ultrasound obtained today and abdomen CT dated 06/09/2015.  FINDINGS: Normal visualization of the liver, bile ducts and small bowel. The gallbladder was not visualized 1 hour after injection. Therefore, the patient was given 2.8 mg of morphine and an additional 1 mCi of technetium 960mholetec 1 hour after injection. This resulted in faint visualization of the gallbladder.  IMPRESSION: Delayed visualization of the gallbladder, only seen after administration of 2.8 mg of morphine. No cystic duct obstruction.   Electronically Signed   By: StClaudie Revering.D.   On: 06/11/2015 17:50   UsKoreabdomen Complete  06/11/2015   CLINICAL DATA:  Abnormal gallbladder on CT  EXAM: ULTRASOUND ABDOMEN COMPLETE  COMPARISON:  CT abdomen pelvis 06/09/2015  FINDINGS: Gallbladder:  Distended gallbladder containing gallstones and extensive sludge. Gallbladder wall is thickened at 5.4 mm. Negative sonographic Murphy sign.  There is focal soft tissue thickening of the lateral wall of the gallbladder measuring 14 mm in thickness raising the possibility of a mass lesion. This is difficult difficult to confirm on the CT scan.  Common bile duct: Diameter: 3.6 mm  Liver: 9 mm cyst.  No other liver mass lesion.  IVC: No abnormality visualized.  Pancreas: Not visualized  Spleen: Size and appearance within normal limits.  Right Kidney: Length: 10.7 cm. Echogenicity within normal limits. No mass or hydronephrosis visualized.  Left Kidney: Length: 10.9 cm. Echogenicity within normal limits. No mass or hydronephrosis visualized.  Abdominal aorta: No aneurysm visualized.  Other findings: None.  IMPRESSION: Distended gallbladder containing gallstones and sludge. Gallbladder wall thickening. The patient is nontender over the gallbladder. Possible acute cholecystitis. The patient currently has a normal white blood count. There is a possible mass the lateral gallbladder which could be due to carcinoma versus sludge or inflammation.   Electronically Signed  By: Franchot Gallo M.D.   On: 06/11/2015 12:29    Medications / Allergies:  Scheduled Meds: . demeclocycline  150 mg Oral BID  . diltiazem  180 mg Oral Q1200  . furosemide  40 mg Oral Daily  . pantoprazole  40 mg Oral q morning - 10a  . piperacillin-tazobactam (ZOSYN)  IV  3.375 g Intravenous 3 times per day  . potassium chloride SA  20 mEq Oral Daily  . sucralfate  1 g Oral TID WC & HS  . tamsulosin  0.4 mg Oral Daily   Continuous Infusions: . sodium chloride Stopped (06/11/15 1230)  . 0.9 % NaCl with KCl 20 mEq / L 100 mL/hr at 06/11/15 1848  . heparin 1,000 Units/hr (06/11/15 2005)   PRN Meds:.acetaminophen **OR** acetaminophen, ALPRAZolam, mirtazapine, morphine injection, ondansetron  Antibiotics: Anti-infectives    Start      Dose/Rate Route Frequency Ordered Stop   06/11/15 2200  demeclocycline (DECLOMYCIN) tablet 150 mg     150 mg Oral 2 times daily 06/11/15 1459     06/11/15 1415  piperacillin-tazobactam (ZOSYN) IVPB 3.375 g     3.375 g 12.5 mL/hr over 240 Minutes Intravenous 3 times per day 06/11/15 1409          Assessment/Plan Acute cholecystitis -cholecystostomy in AM per IR which is to remain in place for 6-8 weeks.  We will discuss with Dr. [name] regarding the patients prognosis and determine whether he would be a surgical candidate in the future. 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 S/p palliative radiation to left adrenal gland 1/16  Right breast enlargement-further work up per Dr. [name]  Atrial fibrillation-Eliquis on hold since 7/7 AM.  On heparin drip.  C/w home meds. Hyponatremia-Na 129. Will start NS with KCL at 18m/hr Hypokalemia-give extra PO, add Mg level Hypertension-continue home meds Dispo-continue inpatient  Appreciate medicine management   EErby Pian ABlue Hen Surgery CenterSurgery Pager [phone])   06/12/2015 11:13 AM

## 2015-06-12 NOTE — Progress Notes (Signed)
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 After procedure patient will go back on his home medication for anticoagulation  Code Status: full Family Communication: no family at bedside  Disposition Plan: Per primary team   antibiotics:  Zosyn  HPI/Subjective: Pt has no new complaints  Objective: Filed Vitals:   06/12/15 1448  BP: 122/62  Pulse: 64  Temp: 98.1 F (36.7 C)  Resp: 18    Intake/Output Summary (Last 24 hours) at 06/12/15 1546 Last data filed at 06/12/15 1303  Gross per 24 hour  Intake    480 ml  Output      0 ml  Net    480 ml   There were no vitals filed for this visit.  Exam:   General:  Pt in nad, alert and awake  Cardiovascular: no cyanosis  Respiratory: no increased wob, no wheezes, equal chest rise   Abdomen: Soft, nondistended, no guarding  Musculoskeletal: No cyanosis or clubbing    Data Reviewed: Basic Metabolic Panel:  Recent Labs Lab 06/10/15 1241 06/11/15 1132 06/12/15 0519 06/12/15 1246  NA 128* 128* 129*  --   K 3.3* 3.5 3.4*  --   CL  --  88* 94*  --   CO2 '27 30 24  '$ --   GLUCOSE 117 108* 77  --   BUN 12.'9 12 12  '$ --   CREATININE 0.8 0.79 0.78  --   CALCIUM 9.2 8.7* 8.6*  --   MG  --   --   --  1.5*   Liver Function Tests:  Recent Labs Lab 06/10/15 1241 06/11/15 1132 06/12/15 0519  AST '18 24 23  '$ ALT  '16 17 19  '$ ALKPHOS 94 84 81  BILITOT 0.74 1.0 1.1  PROT 6.5 6.8 6.1*  ALBUMIN 3.4* 3.7 3.4*    Recent Labs Lab 06/11/15 1132  LIPASE <10*   No results for input(s): AMMONIA in the last 168 hours. CBC:  Recent Labs Lab 06/10/15 1240 06/11/15 1132 06/12/15 0519  WBC 7.5 6.3 5.5  NEUTROABS 6.0 4.2  --   HGB 13.3 12.7* 12.6*  HCT 38.3* 35.7* 35.2*  MCV 93.6 90.2 90.0  PLT 316 340 340   Cardiac Enzymes: No results for input(s): CKTOTAL, CKMB, CKMBINDEX, TROPONINI in the last 168 hours. BNP (last 3 results) No results for input(s): BNP in the last 8760 hours.  ProBNP (last 3 results)  Recent Labs  07/27/14 0347  PROBNP 1683.0*    CBG: No results for input(s): GLUCAP in the last 168 hours.  No results found for this or any previous visit (from the past 240 hour(s)).   Studies: Nm Hepatobiliary Liver Func  06/11/2015   CLINICAL DATA:  Right upper quadrant abdominal pain, nausea and vomiting intermittently for the past 2 weeks. Cholelithiasis and gallbladder sludge and possible gallbladder mass at ultrasound today.  EXAM: NUCLEAR MEDICINE HEPATOBILIARY IMAGING  TECHNIQUE: Sequential images of the abdomen were obtained out to 60 minutes following intravenous administration  of radiopharmaceutical.  RADIOPHARMACEUTICALS:  5.1 mCi Tc-19m Choletec IV  COMPARISON:  Abdomen ultrasound obtained today and abdomen CT dated 06/09/2015.  FINDINGS: Normal visualization of the liver, bile ducts and small bowel. The gallbladder was not visualized 1 hour after injection. Therefore, the patient was given 2.8 mg of morphine and an additional 1 mCi of technetium [id number] 1 hour after injection. This resulted in faint visualization of the gallbladder.  IMPRESSION: Delayed visualization of the gallbladder, only seen after administration of 2.8 mg of morphine. No cystic duct obstruction.   Electronically Signed   By: StClaudie Revering.D.   On: 06/11/2015 17:50   UsKoreabdomen Complete  06/11/2015    CLINICAL DATA:  Abnormal gallbladder on CT  EXAM: ULTRASOUND ABDOMEN COMPLETE  COMPARISON:  CT abdomen pelvis 06/09/2015  FINDINGS: Gallbladder: Distended gallbladder containing gallstones and extensive sludge. Gallbladder wall is thickened at 5.4 mm. Negative sonographic Murphy sign.  There is focal soft tissue thickening of the lateral wall of the gallbladder measuring 14 mm in thickness raising the possibility of a mass lesion. This is difficult difficult to confirm on the CT scan.  Common bile duct: Diameter: 3.6 mm  Liver: 9 mm cyst.  No other liver mass lesion.  IVC: No abnormality visualized.  Pancreas: Not visualized  Spleen: Size and appearance within normal limits.  Right Kidney: Length: 10.7 cm. Echogenicity within normal limits. No mass or hydronephrosis visualized.  Left Kidney: Length: 10.9 cm. Echogenicity within normal limits. No mass or hydronephrosis visualized.  Abdominal aorta: No aneurysm visualized.  Other findings: None.  IMPRESSION: Distended gallbladder containing gallstones and sludge. Gallbladder wall thickening. The patient is nontender over the gallbladder. Possible acute cholecystitis. The patient currently has a normal white blood count. There is a possible mass the lateral gallbladder which could be due to carcinoma versus sludge or inflammation.   Electronically Signed   By: ChFranchot Gallo.D.   On: 06/11/2015 12:29    Scheduled Meds: . demeclocycline  150 mg Oral BID  . diltiazem  180 mg Oral Q1200  . furosemide  40 mg Oral Daily  . pantoprazole  40 mg Oral q morning - 10a  . piperacillin-tazobactam (ZOSYN)  IV  3.375 g Intravenous 3 times per day  . potassium chloride SA  20 mEq Oral Daily  . sucralfate  1 g Oral TID WC & HS  . tamsulosin  0.4 mg Oral Daily   Continuous Infusions: . sodium chloride Stopped (06/11/15 1230)  . 0.9 % NaCl with KCl 20 mEq / L 100 mL/hr at 06/11/15 1848  . heparin 900 Units/hr (06/12/15 1450)      Time spent: > 10  minutes    VEVelvet [facility]athe[facility] Pager [phone] 7PM-7AM, please contact night-coverage at [email or web address], password [email or web address]/07/2015, 3:46 PM  LOS: 1 day

## 2015-06-12 NOTE — H&P (Signed)
Reason for Consult: Acute Cholecystitis  Chief Complaint: Chief Complaint  Patient presents with  . Abdominal Pain    Referring Physician(s): CCS  History of Present Illness: Anthony Curry is a 74 y.o. male who presented approximately 2 weeks ago with "heartburn" and vomiting and was discharged with continued pain, however more located RUQ constant pain that was worse with movement and inspiration, he was not eating much during this time to tell if diet affected this pain. He does state he had continued lack of energy, nausea, decreased appetite and weight loss. He denies any fever or chills during this time. He recently underwent a CT for follow-up of his lung cancer and findings concerning for acute cholecystitis. The patient presented to ED for full work-up and HIDA was performed. CCS has seen the patient and recommended IR consult for percutaneous cholecystostomy drain. The patient denies any chest pain, shortness of breath or palpitations. He denies any active signs of bleeding or excessive bruising. He denies any recent fever or chills. The patient denies any history of sleep apnea or chronic oxygen use. He has previously tolerated sedation without complications.    Past Medical History  Diagnosis Date  . Hypertension   . GERD (gastroesophageal reflux disease)   . Alcohol abuse   . 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 Low sodium   . Persistent atrial fibrillation 04/11/14    chads2vasc score is 2  . Hypotension   . Atrial fibrillation with RVR   . HCAP (healthcare-associated pneumonia)   . Adrenal hemorrhage   . BPH (benign prostatic hyperplasia)   . Hyponatremia   . Anemia of chronic disease   . Fall   . Alcohol intoxication   . Pulmonary nodules   . Adrenal mass   . Atrial flutter by electrocardiogram   . 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 Alcoholism in remission   . Radiation 12/02/14-12/16/14    Left adrenal gland 30 Gy in 10 fractions  .  Cancer     small cell/lung  . Neoplastic malignant related fatigue     Past Surgical History  Procedure Laterality Date  . Hemorrhoid surgery    . Ankle arthrodesis  1995    rt fx-hardware in  . Tonsillectomy    . Colonoscopy    . Excision / curettage bone cyst finger  2005    rt thumb  . Total shoulder arthroplasty  02/03/2012    Procedure: TOTAL SHOULDER ARTHROPLASTY;  Surgeon: [name], MD;  Location: [facility];  Service: Orthopedics;  Laterality: Left;  . Cardioversion N/A 09/29/2014    Procedure: CARDIOVERSION;  Surgeon: Josue Hector, MD;  Location: [facility] ENDOSCOPY;  Service: Cardiovascular;  Laterality: N/A;    Allergies: Aspirin  Medications: Prior to Admission medications   Medication Sig Start Date End Date Taking? Authorizing Provider  ALPRAZolam (XANAX) 0.25 MG tablet Take 0.25 mg by mouth 3 (three) times daily as needed for anxiety or sleep.   Yes Historical Provider, MD  apixaban (ELIQUIS) 5 MG TABS tablet Take 1 tablet (5 mg total) by mouth 2 (two) times daily. 12/12/14  Yes Thompson Grayer, MD  Ascorbic Acid (VITAMIN C PO) Take 1,000 mg by mouth daily.    Yes Historical Provider, MD  B Complex-C (SUPER B COMPLEX PO) Take 1 each by mouth daily.    Yes Historical Provider, MD  Biotin 7500 MCG TABS Take 1 tablet by mouth daily.    Yes Historical Provider, MD  calcium  carbonate (OS-CAL) 600 MG TABS Take 600 mg by mouth daily with breakfast.   Yes Historical Provider, MD  demeclocycline (DECLOMYCIN) 150 MG tablet Take 150 mg by mouth 2 (two) times daily.   Yes Historical Provider, MD  diltiazem (CARDIZEM CD) 180 MG 24 hr capsule Take 1 capsule (180 mg total) by mouth daily at 12 noon. 01/09/15  Yes Sherran Needs, NP  furosemide (LASIX) 40 MG tablet Take 1 tablet (40 mg total) by mouth daily. 05/11/15  Yes Thompson Grayer, MD  HYDROcodone-acetaminophen (NORCO/VICODIN) 5-325 MG per tablet Take 1 tablet by mouth every 6 (six) hours as needed. Patient taking  differently: Take 1 tablet by mouth every 6 (six) hours as needed for severe pain.  06/01/15  Yes Ankit Nanavati, MD  KLOR-CON M20 20 MEQ tablet Take 20 mEq by mouth daily.  10/18/14  Yes Historical Provider, MD  Lactobacillus (ACIDOPHILUS) CAPS capsule Take 1 capsule by mouth daily.   Yes Historical Provider, MD  MAGNESIUM PO Take 400 mg by mouth daily.    Yes Historical Provider, MD  metoprolol tartrate (LOPRESSOR) 25 MG tablet Take 0.5 tablets (12.5 mg total) by mouth 2 (two) times daily. Patient taking differently: Take 6.25 mg by mouth 2 (two) times daily.  05/11/15  Yes Thompson Grayer, MD  mirtazapine (REMERON) 15 MG tablet Take 1 tablet (15 mg total) by mouth at bedtime. Patient taking differently: Take 15 mg by mouth at bedtime as needed (sleep).  01/19/15  Yes [name], MD  Multiple Vitamin (MULTIVITAMIN WITH MINERALS) TABS Take 1 tablet by mouth daily.   Yes Historical Provider, MD  Nivolumab (OPDIVO IV) Inject as directed every 2 weeks   Yes Historical Provider, MD  Nutritional Supplements (ENSURE COMPLETE PO) Take 1 Can by mouth 2 (two) times daily.   Yes Historical Provider, MD  Omega-3 Fatty Acids (FISH OIL PO) Take 1 tablet by mouth daily.   Yes Historical Provider, MD  ondansetron (ZOFRAN ODT) 8 MG disintegrating tablet Take 1 tablet (8 mg total) by mouth every 8 (eight) hours as needed for nausea. 06/01/15  Yes Varney Biles, MD  pantoprazole (PROTONIX) 40 MG tablet Take 40 mg by mouth every morning. 06/02/15  Yes Historical Provider, MD  tamsulosin (FLOMAX) 0.4 MG CAPS capsule Take 0.4 mg by mouth daily.    Yes Historical Provider, MD  thiamine (VITAMIN B-1) 100 MG tablet Take 1 tablet (100 mg total) by mouth daily. 09/11/13  Yes Erick Colace, NP  vitamin E 400 UNIT capsule Take 400 Units by mouth daily.   Yes Historical Provider, MD  sucralfate (CARAFATE) 1 G tablet Take 1 tablet (1 g total) by mouth 4 (four) times daily -  with meals and at bedtime. Patient not taking:  Reported on 06/11/2015 06/01/15   Varney Biles, MD     Family History  Problem Relation Age of Onset  . Brain cancer Brother   . Lung cancer Mother   . Hypertension Father   . Diabetes Paternal Grandmother   . Heart attack Father   . Heart attack Brother     History   Social History  . Marital Status: Married    Spouse Name: N/A  . Number of Children: 3  . Years of Education: N/A   Occupational History  . 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 Smoking status: Former Smoker -- 2.00 packs/day for 30 years    Types: Cigarettes    Quit  date: 12/05/1990  . Smokeless tobacco: Never Used  . Alcohol Use: No     Comment: does not drink now  . Drug Use: No  . Sexual Activity: Not Currently   Other Topics Concern  . None   Social History Narrative   Retired from [employer]: A 12 point ROS discussed and pertinent positives are indicated in the HPI above.  All other systems are negative.  Review of Systems  Vital Signs: BP 120/63 mmHg  Pulse 77  Temp(Src) 98.7 F (37.1 C) (Oral)  Resp 16  Ht '5\' 8"'$  (1.727 m)  SpO2 94%  Physical Exam  Constitutional: He is oriented to person, place, and time. No distress.  HENT:  Head: Normocephalic and atraumatic.  Neck: No tracheal deviation present.  Cardiovascular: Normal rate and regular rhythm.  Exam reveals no gallop and no friction rub.   No murmur heard. Abdominal: Soft. Bowel sounds are normal. He exhibits no distension.  RUQ lateral TTP  Neurological: He is alert and oriented to person, place, and time.  Skin: Skin is warm and dry. He is not diaphoretic.  Psychiatric: He has a normal mood and affect. His behavior is normal. Thought content normal.    Mallampati Score:  MD Evaluation Airway: WNL Heart: WNL Abdomen: WNL Chest/ Lungs: WNL ASA  Classification: 3 Mallampati/Airway Score: One  Imaging: Dg Chest 2 View  06/01/2015   CLINICAL DATA:  Atrial  fibrillation.  Hypotension.  EXAM: CHEST  2 VIEW  COMPARISON:  04/10/2015  FINDINGS: There is moderate hyperinflation. There are mild linear opacities bilaterally which are not significantly changed and this may represent scarring. There is no confluent airspace opacity. There is no effusion. Heart size is normal. Pulmonary vasculature is normal.  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  IMPRESSION: Bilateral linear central and basilar opacities, likely scarring. Multiple ununited right rib fractures. No acute cardiopulmonary findings.   Electronically Signed   By: Andreas Newport M.D.   On: 06/01/2015 02:55   Ct Chest W Contrast  06/09/2015   CLINICAL DATA:  Subsequent evaluation of the a 74 year old male with history of lung cancer diagnosed in November 2015. Right-sided chest pain, and pain during deep inspiration for 1 week. Chemotherapy and radiation therapy now complete. Oral chemotherapy ongoing. Prior smoker.  EXAM: CT CHEST, ABDOMEN, AND PELVIS WITH CONTRAST  TECHNIQUE: Multidetector CT imaging of the chest, abdomen and pelvis was performed following the standard protocol during bolus administration of intravenous contrast.  CONTRAST:  141m OMNIPAQUE IOHEXOL 300 MG/ML  SOLN  COMPARISON:  Chest CT 06/01/2015. CT of the chest, abdomen and pelvis 04/10/2015.  FINDINGS: CT CHEST FINDINGS  Mediastinum/Lymph Nodes: Heart size is normal. Small amount of anterior pericardial fluid and/or thickening, similar to prior examinations, and the unlikely to be of any hemodynamic significance at this time. No associated pericardial calcification. There is atherosclerosis of the thoracic aorta, the great vessels of the mediastinum and the coronary arteries, including calcified atherosclerotic plaque in the left main, left anterior descending and right coronary arteries. No pathologically enlarged  mediastinal or hilar lymph nodes. Esophagus is unremarkable in appearance. No axillary lymphadenopathy.  Lungs/Pleura: Previously noted nodule in the medial aspect of the apex of the left upper lobe (image 9 of series 4) is stable to slightly decreased in size compared to the prior examination, currently measuring 13 x 7 mm. The other  previously described 8 mm spiculated nodule in the left upper lobe is now a very ill-defined area of ground-glass attenuation (image 19 of series 4). Several other scattered areas of ground-glass attenuation are also noted in the upper lobes of the lungs bilaterally, which appear similar to the prior examination. Linear scarring in the left lower lobe is unchanged. New linear opacities and architectural distortion in the right lower lobe may reflect new areas of developing scarring, or may simply reflect subsegmental atelectasis. Mild diffuse bronchial wall thickening with mild centrilobular and paraseptal emphysema. No acute consolidative airspace disease. No pleural effusions.  Musculoskeletal/Soft Tissues: Asymmetric right-sided gynecomastia again noted, increased in size measuring up to 3.2 x 1.3 cm on today's examination (previously 2.5 x 1.2 cm on prior study 11/10/2014), and completely new compared to remote prior PET-CT 08/27/2013. Multiple old healed right-sided rib fractures. Status post left shoulder hemiarthroplasty. Chronic posttraumatic degenerative changes in the right shoulder redemonstrated. There are no aggressive appearing lytic or blastic lesions noted in the visualized portions of the skeleton.  CT ABDOMEN AND PELVIS FINDINGS  Hepatobiliary: Several tiny sub cm low-attenuation liver lesions and sub cm hypervascular lesions are unchanged in size, number and distribution in the liver, likely to reflect a combination of small cysts and flash fill cavernous hemangiomas. No new aggressive appearing liver lesions are noted. No intra or extrahepatic biliary ductal  dilatation. However, there does appear to be a 4 mm partially calcified stone in the distal common bile duct (image 71 of series 2). There are several partially calcified gallstones lying dependently in the gallbladder, and the gallbladder now appears moderately distended, with a thickened wall which measures up to 9 mm, with small amount of pericholecystic fluid and extensive pericholecystic stranding, strongly suggestive of acute cholecystitis at this time.  Pancreas: No pancreatic mass. No pancreatic ductal dilatation. No pancreatic or peripancreatic fluid or inflammatory changes.  Spleen: Unremarkable.  Adrenals/Urinary Tract: Bilateral perinephric stranding (nonspecific). Multifocal cortical thinning in the kidneys bilaterally, presumably chronic post infectious or inflammatory scarring. Sub cm low-attenuation lesions in the kidneys bilaterally are too small to definitively characterize, but are similar to prior studies, presumably small cysts. Mild bilateral adrenal nodularity, now difficult to accurately measure, but smaller than the prior examinations. No hydroureteronephrosis. Urinary bladder is remarkable for multiple small bladder wall diverticulae.  Stomach/Bowel: Normal appearance of the stomach. No pathologic dilatation of small bowel or colon. A few scattered colonic diverticulae are noted, without surrounding inflammatory changes to suggest an acute diverticulitis at this time.  Vascular/Lymphatic: Extensive atherosclerosis throughout the abdominal and pelvic vasculature, without evidence of aneurysm or dissection.  Reproductive: Prostate gland and seminal vesicles are unremarkable in appearance.  Other: Trace volume of ascites.  No pneumoperitoneum.  Musculoskeletal: There are no aggressive appearing lytic or blastic lesions noted in the visualized portions of the skeleton. Extensive multilevel degenerative disc disease and spondylosis throughout the lumbar spine.  IMPRESSION: 1. Findings are highly  concerning for acute cholecystitis. This could be confirmed with right upper quadrant ultrasound if clinically appropriate. Surgical consultation is also suggested. 2. Generally stable (or slightly improved) appearance of multiple pulmonary nodules (most of which are ground-glass attenuation) in the lungs bilaterally, as above. Continued attention on followup studies is recommended to ensure continued stability. 3. Slow progressive enlargement of asymmetric gynecomastia. This could represent a soft tissue metastasis, or primary male breast cancer, and further evaluation with breast ultrasound and/or mammography is suggested if clinically appropriate. 4. Additional incidental findings, as above, generally similar prior  examinations. These results will be called to the ordering clinician or representative by the Radiologist Assistant, and communication documented in the PACS or zVision Dashboard.   Electronically Signed   By: Vinnie Langton M.D.   On: 06/09/2015 15:45   Ct Angio Chest Pe W/cm &/or Wo Cm  06/01/2015   CLINICAL DATA:  Midsternal chest pain, onset yesterday.  EXAM: CT ANGIOGRAPHY CHEST WITH CONTRAST  TECHNIQUE: Multidetector CT imaging of the chest was performed using the standard protocol during bolus administration of intravenous contrast. Multiplanar CT image reconstructions and MIPs were obtained to evaluate the vascular anatomy.  CONTRAST:  150m OMNIPAQUE IOHEXOL 350 MG/ML SOLN  COMPARISON:  04/10/2015  FINDINGS: Cardiovascular: There is good opacification of the pulmonary arteries. There is no pulmonary embolism. The thoracic aorta is normal in caliber and intact.  Lungs: Unchanged 5 x 14 mm medial left apical spiculated nodule. Unchanged smaller nodules elsewhere in the left upper lobe. New or worsened ground-glass opacities in both upper lobes. Underlying emphysematous changes.  Central airways: Patent. Small amount of retained secretions in the trachea.  Effusions: None  Lymphadenopathy:  None  Esophagus: Unremarkable  Upper abdomen: Unremarkable  Musculoskeletal: Asymmetric right gynecomastia, unchanged  Review of the MIP images confirms the above findings.  IMPRESSION: 1. Negative for pulmonary embolism 2. New or worsened mild ground-glass opacities in both upper lobes, superimposed on extensive emphysematous changes. This could represent infectious infiltrate. Alveolar edema is a possibility. Neoplastic ground-glass opacities are less likely. Unchanged spiculated medial left apical nodule as well as smaller upper lobe nodules. 3. Small amount of retained secretions in the trachea.   Electronically Signed   By: DAndreas NewportM.D.   On: 06/01/2015 04:35   Nm Hepatobiliary Liver Func  06/11/2015   CLINICAL DATA:  Right upper quadrant abdominal pain, nausea and vomiting intermittently for the past 2 weeks. Cholelithiasis and gallbladder sludge and possible gallbladder mass at ultrasound today.  EXAM: NUCLEAR MEDICINE HEPATOBILIARY IMAGING  TECHNIQUE: Sequential images of the abdomen were obtained out to 60 minutes following intravenous administration of radiopharmaceutical.  RADIOPHARMACEUTICALS:  5.1 mCi Tc-92mCholetec IV  COMPARISON:  Abdomen ultrasound obtained today and abdomen CT dated 06/09/2015.  FINDINGS: Normal visualization of the liver, bile ducts and small bowel. The gallbladder was not visualized 1 hour after injection. Therefore, the patient was given 2.8 mg of morphine and an additional 1 mCi of technetium [id number] 1 hour after injection. This resulted in faint visualization of the gallbladder.  IMPRESSION: Delayed visualization of the gallbladder, only seen after administration of 2.8 mg of morphine. No cystic duct obstruction.   Electronically Signed   By: SteClaudie ReveringD.   On: 06/11/2015 17:50   Us [place] Complete  06/11/2015   CLINICAL DATA:  Abnormal gallbladder on CT  EXAM: ULTRASOUND ABDOMEN COMPLETE  COMPARISON:  CT abdomen pelvis 06/09/2015  FINDINGS:  Gallbladder: Distended gallbladder containing gallstones and extensive sludge. Gallbladder wall is thickened at 5.4 mm. Negative sonographic Murphy sign.  There is focal soft tissue thickening of the lateral wall of the gallbladder measuring 14 mm in thickness raising the possibility of a mass lesion. This is difficult difficult to confirm on the CT scan.  Common bile duct: Diameter: 3.6 mm  Liver: 9 mm cyst.  No other liver mass lesion.  IVC: No abnormality visualized.  Pancreas: Not visualized  Spleen: Size and appearance within normal limits.  Right Kidney: Length: 10.7 cm. Echogenicity within normal limits. No mass or hydronephrosis visualized.  Left Kidney: Length: 10.9 cm. Echogenicity within normal limits. No mass or hydronephrosis visualized.  Abdominal aorta: No aneurysm visualized.  Other findings: None.  IMPRESSION: Distended gallbladder containing gallstones and sludge. Gallbladder wall thickening. The patient is nontender over the gallbladder. Possible acute cholecystitis. The patient currently has a normal white blood count. There is a possible mass the lateral gallbladder which could be due to carcinoma versus sludge or inflammation.   Electronically Signed   By: Franchot Gallo M.D.   On: 06/11/2015 12:29   Ct Abdomen Pelvis W Contrast  06/09/2015   CLINICAL DATA:  Subsequent evaluation of the a 74 year old male with history of lung cancer diagnosed in November 2015. Right-sided chest pain, and pain during deep inspiration for 1 week. Chemotherapy and radiation therapy now complete. Oral chemotherapy ongoing. Prior smoker.  EXAM: CT CHEST, ABDOMEN, AND PELVIS WITH CONTRAST  TECHNIQUE: Multidetector CT imaging of the chest, abdomen and pelvis was performed following the standard protocol during bolus administration of intravenous contrast.  CONTRAST:  136m OMNIPAQUE IOHEXOL 300 MG/ML  SOLN  COMPARISON:  Chest CT 06/01/2015. CT of the chest, abdomen and pelvis 04/10/2015.  FINDINGS: CT CHEST FINDINGS   Mediastinum/Lymph Nodes: Heart size is normal. Small amount of anterior pericardial fluid and/or thickening, similar to prior examinations, and the unlikely to be of any hemodynamic significance at this time. No associated pericardial calcification. There is atherosclerosis of the thoracic aorta, the great vessels of the mediastinum and the coronary arteries, including calcified atherosclerotic plaque in the left main, left anterior descending and right coronary arteries. No pathologically enlarged mediastinal or hilar lymph nodes. Esophagus is unremarkable in appearance. No axillary lymphadenopathy.  Lungs/Pleura: Previously noted nodule in the medial aspect of the apex of the left upper lobe (image 9 of series 4) is stable to slightly decreased in size compared to the prior examination, currently measuring 13 x 7 mm. The other previously described 8 mm spiculated nodule in the left upper lobe is now a very ill-defined area of ground-glass attenuation (image 19 of series 4). Several other scattered areas of ground-glass attenuation are also noted in the upper lobes of the lungs bilaterally, which appear similar to the prior examination. Linear scarring in the left lower lobe is unchanged. New linear opacities and architectural distortion in the right lower lobe may reflect new areas of developing scarring, or may simply reflect subsegmental atelectasis. Mild diffuse bronchial wall thickening with mild centrilobular and paraseptal emphysema. No acute consolidative airspace disease. No pleural effusions.  Musculoskeletal/Soft Tissues: Asymmetric right-sided gynecomastia again noted, increased in size measuring up to 3.2 x 1.3 cm on today's examination (previously 2.5 x 1.2 cm on prior study 11/10/2014), and completely new compared to remote prior PET-CT 08/27/2013. Multiple old healed right-sided rib fractures. Status post left shoulder hemiarthroplasty. Chronic posttraumatic degenerative changes in the right  shoulder redemonstrated. There are no aggressive appearing lytic or blastic lesions noted in the visualized portions of the skeleton.  CT ABDOMEN AND PELVIS FINDINGS  Hepatobiliary: Several tiny sub cm low-attenuation liver lesions and sub cm hypervascular lesions are unchanged in size, number and distribution in the liver, likely to reflect a combination of small cysts and flash fill cavernous hemangiomas. No new aggressive appearing liver lesions are noted. No intra or extrahepatic biliary ductal dilatation. However, there does appear to be a 4 mm partially calcified stone in the distal common bile duct (image 71 of series 2). There are several partially calcified gallstones lying dependently in the gallbladder, and the  gallbladder now appears moderately distended, with a thickened wall which measures up to 9 mm, with small amount of pericholecystic fluid and extensive pericholecystic stranding, strongly suggestive of acute cholecystitis at this time.  Pancreas: No pancreatic mass. No pancreatic ductal dilatation. No pancreatic or peripancreatic fluid or inflammatory changes.  Spleen: Unremarkable.  Adrenals/Urinary Tract: Bilateral perinephric stranding (nonspecific). Multifocal cortical thinning in the kidneys bilaterally, presumably chronic post infectious or inflammatory scarring. Sub cm low-attenuation lesions in the kidneys bilaterally are too small to definitively characterize, but are similar to prior studies, presumably small cysts. Mild bilateral adrenal nodularity, now difficult to accurately measure, but smaller than the prior examinations. No hydroureteronephrosis. Urinary bladder is remarkable for multiple small bladder wall diverticulae.  Stomach/Bowel: Normal appearance of the stomach. No pathologic dilatation of small bowel or colon. A few scattered colonic diverticulae are noted, without surrounding inflammatory changes to suggest an acute diverticulitis at this time.  Vascular/Lymphatic:  Extensive atherosclerosis throughout the abdominal and pelvic vasculature, without evidence of aneurysm or dissection.  Reproductive: Prostate gland and seminal vesicles are unremarkable in appearance.  Other: Trace volume of ascites.  No pneumoperitoneum.  Musculoskeletal: There are no aggressive appearing lytic or blastic lesions noted in the visualized portions of the skeleton. Extensive multilevel degenerative disc disease and spondylosis throughout the lumbar spine.  IMPRESSION: 1. Findings are highly concerning for acute cholecystitis. This could be confirmed with right upper quadrant ultrasound if clinically appropriate. Surgical consultation is also suggested. 2. Generally stable (or slightly improved) appearance of multiple pulmonary nodules (most of which are ground-glass attenuation) in the lungs bilaterally, as above. Continued attention on followup studies is recommended to ensure continued stability. 3. Slow progressive enlargement of asymmetric gynecomastia. This could represent a soft tissue metastasis, or primary male breast cancer, and further evaluation with breast ultrasound and/or mammography is suggested if clinically appropriate. 4. Additional incidental findings, as above, generally similar prior examinations. These results will be called to the ordering clinician or representative by the Radiologist Assistant, and communication documented in the PACS or zVision Dashboard.   Electronically Signed   By: Vinnie Langton M.D.   On: 06/09/2015 15:45    Labs:  CBC:  Recent Labs  06/01/15 0149 06/10/15 1240 06/11/15 1132 06/12/15 0519  WBC 9.8 7.5 6.3 5.5  HGB 14.5 13.3 12.7* 12.6*  HCT 40.3 38.3* 35.7* 35.2*  PLT 235 316 340 340    COAGS:  Recent Labs  09/01/14 1148 06/11/15 1905 06/12/15 0519 06/12/15 0545  INR 1.1*  --  1.26  --   APTT  --  45*  --  70*    BMP:  Recent Labs  09/05/14 0515  09/22/14 1122  06/01/15 0149 06/10/15 1241 06/11/15 1132  06/12/15 0519  NA 127*  < > 135  < > 128* 128* 128* 129*  K 4.4  < > 3.0*  < > 3.3* 3.3* 3.5 3.4*  CL 89*  --  91*  --  94*  --  88* 94*  CO2 28  < > 30  < > '22 27 30 24  '$ GLUCOSE 83  < > 71  < > 146* 117 108* 77  BUN 12  < > 21  < > 12 12.'9 12 12  '$ CALCIUM 8.7  < > 8.9  < > 9.3 9.2 8.7* 8.6*  CREATININE 0.57  < > 0.8  < > 0.69 0.8 0.79 0.78  GFRNONAA >90  --   --   --  >60  --  >60 >  60  GFRAA >90  --   --   --  >60  --  >60 >60  < > = values in this interval not displayed.  LIVER FUNCTION TESTS:  Recent Labs  05/27/15 0946 06/10/15 1241 06/11/15 1132 06/12/15 0519  BILITOT 0.57 0.74 1.0 1.1  AST '23 18 24 23  '$ ALT '20 16 17 19  '$ ALKPHOS 112 94 84 81  PROT 6.2* 6.5 6.8 6.1*  ALBUMIN 3.9 3.4* 3.7 3.4*    Assessment and Plan: RUQ abdominal pain with fatigue, nausea and decreased appetite/weight loss Acute cholecystitis CT, HIDA, clinical symptoms and labs/vitals reviewed with Dr. Vernard Gambles today Will proceed with image guided percutaneous cholecystostomy tube placement tomorrow around 9am Afib-previously on Eliquis held LD 7/7 0930-now on IV heparin will need to be turned off at 0300 in preparation of procedure The patient will be NPO after midnight Risks and Benefits discussed with the patient including, but not limited to bleeding, infection, gallbladder perforation, bile leak, sepsis or even death. All of the patient's questions were answered, patient is agreeable to proceed. Consent signed and in chart. 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 [name]   Thank you for this interesting consult.  I greatly enjoyed meeting Garhett Bernhard and look forward to participating in their care.  SignedHedy Jacob 06/12/2015, 11:51 AM   I spent a total of 40 Minutes in face to face in clinical consultation, greater than 50% of which was counseling/coordinating care for acute cholecystitis.

## 2015-06-12 NOTE — Care Management Note (Signed)
Case Management Note  Patient Details  Name: Tavonte Seybold MRN: [id number] Date of Birth: 02-18-1941  Subjective/Objective:73 y/o m admitted w/abd pain. Acute cholecystitis.EF:EOFH.From home.                    Action/Plan:sx following-for lap chole Saturday.afib-cardizem gtt.Would recommend PT cons after sx, if MD agree.   Expected Discharge Date:   (UNKNOWN)               Expected Discharge Plan:  Home/Self Care  In-House Referral:     Discharge planning Services  CM Consult  Post Acute Care Choice:    Choice offered to:     DME Arranged:    DME Agency:     HH Arranged:    HH Agency:     Status of Service:  In process, will continue to follow  Medicare Important Message Given:    Date Medicare IM Given:    Medicare IM give by:    Date Additional Medicare IM Given:    Additional Medicare Important Message give by:     If discussed at [place] of Stay Meetings, dates discussed:    Additional Comments:  Dessa Phi, RN 06/12/2015, 10:11 AM

## 2015-06-12 NOTE — Progress Notes (Signed)
ANTICOAGULATION CONSULT NOTE - Follow Up Consult  Pharmacy Consult for Heparin Indication: atrial fibrillation  Allergies  Allergen Reactions  . Aspirin Other (See Comments)    Pt can not have Asprin due to medical conditions according to spouse    Patient Measurements: Height: '5\' 8"'$  (172.7 cm) IBW/kg (Calculated) : 68.4 Heparin Dosing Weight:   Vital Signs: Temp: 98.7 F (37.1 C) (07/08 0133) Temp Source: Oral (07/08 0133) BP: 120/63 mmHg (07/08 0133) Pulse Rate: 77 (07/08 0133)  Labs:  Recent Labs  06/10/15 1240 06/10/15 1241 06/11/15 1132 06/11/15 1905 06/12/15 0519 06/12/15 0529 06/12/15 0545  HGB 13.3  --  12.7*  --  12.6*  --   --   HCT 38.3*  --  35.7*  --  35.2*  --   --   PLT 316  --  340  --  340  --   --   APTT  --   --   --  45*  --   --  70*  LABPROT  --   --   --   --  15.9*  --   --   INR  --   --   --   --  1.26  --   --   HEPARINUNFRC  --   --   --  >2.20*  --  2.06*  --   CREATININE  --  0.8 0.79  --  0.78  --   --     Estimated Creatinine Clearance: 77.2 mL/min (by C-G formula based on Cr of 0.78).   Medications:  Infusions:  . sodium chloride Stopped (06/11/15 1230)  . 0.9 % NaCl with KCl 20 mEq / L 100 mL/hr at 06/11/15 1848  . heparin 1,000 Units/hr (06/11/15 2005)    Assessment: Patient with heparin level > 2, but PTT at goal.  Patient with prior to admission dosing of apixaban.  Apixaban can cause falsely elevated heparin level.  Will use PTT to guide heparin until levels match up.  No heparin issues per RN.  Goal of Therapy:  Heparin level 0.3-0.7 units/ml aPTT 66-102 seconds Monitor platelets by anticoagulation protocol: Yes   Plan:  Continue heparin drip at current rate Recheck level and PTT at [address] [place] 06/12/2015,7:03 AM

## 2015-06-12 NOTE — Progress Notes (Signed)
Patient ID: Anthony Curry, male   DOB: [birth year], 74 y.o.   MRN: [id number]   Mg 1.5.  Will give 2g Mg run.  Repeat CMP and Mg in AM.  Chasey Dull, ANP-BC

## 2015-06-12 NOTE — Progress Notes (Signed)
ANTICOAGULATION CONSULT NOTE - Follow Up Consult  Pharmacy Consult for heparin Indication: AFib  Allergies  Allergen Reactions  . Aspirin Other (See Comments)    Pt can not have Asprin due to medical conditions according to spouse    Patient Measurements: Height: '5\' 8"'$  (172.7 cm) IBW/kg (Calculated) : 68.4 Heparin Dosing Weight: 70.4kg  Vital Signs:    Labs:  Recent Labs  06/10/15 1240 06/10/15 1241 06/11/15 1132 06/11/15 1905 06/12/15 0519 06/12/15 0529 06/12/15 0545 06/12/15 1246  HGB 13.3  --  12.7*  --  12.6*  --   --   --   HCT 38.3*  --  35.7*  --  35.2*  --   --   --   PLT 316  --  340  --  340  --   --   --   APTT  --   --   --  45*  --   --  70* 104*  LABPROT  --   --   --   --  15.9*  --   --   --   INR  --   --   --   --  1.26  --   --   --   HEPARINUNFRC  --   --   --  >2.20*  --  2.06*  --  1.70*  CREATININE  --  0.8 0.79  --  0.78  --   --   --     Estimated Creatinine Clearance: 77.2 mL/min (by C-G formula based on Cr of 0.78).   Medical History: Past Medical History  Diagnosis Date  . Hypertension   . GERD (gastroesophageal reflux disease)   . Alcohol abuse   . 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 Low sodium   . Persistent atrial fibrillation 04/11/14    chads2vasc score is 2  . Hypotension   . Atrial fibrillation with RVR   . HCAP (healthcare-associated pneumonia)   . Adrenal hemorrhage   . BPH (benign prostatic hyperplasia)   . Hyponatremia   . Anemia of chronic disease   . Fall   . Alcohol intoxication   . Pulmonary nodules   . Adrenal mass   . Atrial flutter by electrocardiogram   . 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 Alcoholism in remission   . Radiation 12/02/14-12/16/14    Left adrenal gland 30 Gy in 10 fractions  . Cancer     small cell/lung  . 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 Location is RUQ/right flank.  CT of abdomen and pelvis on 7/5 which suggested acute cholecystitis.  Plan is for percutaneous cholecystostomy in IR 7/9.  Apixaban held and pharmacy consulted to start heparin infusion. Last dose of elaquis was 7/7 @ 0930.    Today, 06/12/2015:  This morning's labs showed heparin level elevated but aPTT at goal.  Apixaban falsely elevating heparin level so will be using aPTT to guide heparin dosing until HL and aPTT labs correlate.  This afternoon, heparin level elevated and trending down but aPTT slightly above therapeutic range at 104 seconds with heparin infusion at 1000 units/hr.  CBC stable.  No bleeding/complications reported.   Renal function appears WNL and stable.  Goal of Therapy:  Heparin level 0.3-0.7 units/ml  aPTT 66-102 seconds Monitor platelets by anticoagulation protocol: yes   Plan:   Reduce heparin infusion to 900 units/hr.  Check aPTT/ HL in 8 hours  Per IR, hold heparin infusion at 0300 7/9 in preparation for procedure at 0900.  Hershal Coria, PharmD, BCPS Pager: [phone] 06/12/2015 2:37 PM

## 2015-06-13 ENCOUNTER — Inpatient Hospital Stay (HOSPITAL_COMMUNITY): Payer: Medicare Other

## 2015-06-13 LAB — COMPREHENSIVE METABOLIC PANEL
ALBUMIN: 3.2 g/dL — AB (ref 3.5–5.0)
ALK PHOS: 68 U/L (ref 38–126)
ALT: 16 U/L — ABNORMAL LOW (ref 17–63)
AST: 20 U/L (ref 15–41)
Anion gap: 8 (ref 5–15)
BILIRUBIN TOTAL: 0.7 mg/dL (ref 0.3–1.2)
BUN: 8 mg/dL (ref 6–20)
CHLORIDE: 95 mmol/L — AB (ref 101–111)
CO2: 25 mmol/L (ref 22–32)
Calcium: 8.4 mg/dL — ABNORMAL LOW (ref 8.9–10.3)
Creatinine, Ser: 0.8 mg/dL (ref 0.61–1.24)
GFR calc Af Amer: >60 mL/min (ref >60)
GFR calc non Af Amer: >60 mL/min (ref >60)
Glucose, Bld: 86 mg/dL (ref 65–99)
POTASSIUM: 3.9 mmol/L (ref 3.5–5.1)
Sodium: 128 mmol/L — ABNORMAL LOW (ref 135–145)
Total Protein: 5.7 g/dL — ABNORMAL LOW (ref 6.5–8.1)

## 2015-06-13 LAB — MAGNESIUM: Magnesium: 1.9 mg/dL (ref 1.7–2.4)

## 2015-06-13 LAB — APTT: APTT: 66 s — AB (ref 24–37)

## 2015-06-13 MED ORDER — FENTANYL CITRATE (PF) 100 MCG/2ML IJ SOLN
INTRAMUSCULAR | Status: AC
  Filled 2015-06-13: qty 4

## 2015-06-13 MED ORDER — ONDANSETRON 4 MG PO TBDP: 8.0000 mg | ORAL_TABLET | Freq: Three times a day (TID) | ORAL | Status: DC | PRN

## 2015-06-13 MED ORDER — HYDROCODONE-ACETAMINOPHEN 5-325 MG PO TABS
1.0000 | ORAL_TABLET | ORAL | Status: DC | PRN
  Administered 2015-06-13 – 2015-06-15 (×4): 2 via ORAL
  Filled 2015-06-13 (×4): qty 2

## 2015-06-13 MED ORDER — MAGIC MOUTHWASH
15.0000 mL | Freq: Four times a day (QID) | ORAL | Status: DC | PRN
Start: 2015-06-13 — End: 2015-06-15
  Filled 2015-06-13: qty 15

## 2015-06-13 MED ORDER — ALUM & MAG HYDROXIDE-SIMETH 200-200-20 MG/5ML PO SUSP: 30.0000 mL | Freq: Four times a day (QID) | ORAL | Status: DC | PRN

## 2015-06-13 MED ORDER — SODIUM CHLORIDE 0.9 % IV SOLN: 250.0000 mL | INTRAVENOUS | Status: DC | PRN

## 2015-06-13 MED ORDER — FENTANYL CITRATE (PF) 100 MCG/2ML IJ SOLN
INTRAMUSCULAR | Status: AC | PRN
  Administered 2015-06-13: 25 ug via INTRAVENOUS

## 2015-06-13 MED ORDER — PHENOL 1.4 % MT LIQD: 2.0000 | OROMUCOSAL | Status: DC | PRN

## 2015-06-13 MED ORDER — IOHEXOL 300 MG/ML  SOLN
5.0000 mL | Freq: Once | INTRAMUSCULAR | Status: AC | PRN
  Administered 2015-06-13: 5 mL via INTRAVENOUS

## 2015-06-13 MED ORDER — SODIUM CHLORIDE 0.9 % IJ SOLN: 3.0000 mL | INTRAMUSCULAR | Status: DC | PRN

## 2015-06-13 MED ORDER — MIDAZOLAM HCL 2 MG/2ML IJ SOLN
INTRAMUSCULAR | Status: AC
  Filled 2015-06-13: qty 6

## 2015-06-13 MED ORDER — ADULT MULTIVITAMIN W/MINERALS CH
1.0000 | ORAL_TABLET | Freq: Every day | ORAL | Status: DC
  Administered 2015-06-13 – 2015-06-15 (×3): 1 via ORAL
  Filled 2015-06-13 (×3): qty 1

## 2015-06-13 MED ORDER — MIDAZOLAM HCL 2 MG/2ML IJ SOLN
INTRAMUSCULAR | Status: AC | PRN
  Administered 2015-06-13: 1 mg via INTRAVENOUS

## 2015-06-13 MED ORDER — DIPHENHYDRAMINE HCL 50 MG/ML IJ SOLN
12.5000 mg | Freq: Four times a day (QID) | INTRAMUSCULAR | Status: DC | PRN
Start: 2015-06-13 — End: 2015-06-15

## 2015-06-13 MED ORDER — RISAQUAD PO CAPS
1.0000 | ORAL_CAPSULE | Freq: Every day | ORAL | Status: DC
  Administered 2015-06-13 – 2015-06-15 (×3): 1 via ORAL
  Filled 2015-06-13 (×3): qty 1

## 2015-06-13 MED ORDER — LIP MEDEX EX OINT
1.0000 "application " | TOPICAL_OINTMENT | Freq: Two times a day (BID) | CUTANEOUS | Status: DC
  Administered 2015-06-13 – 2015-06-15 (×4): 1 via TOPICAL
  Filled 2015-06-13: qty 7

## 2015-06-13 MED ORDER — MAGNESIUM OXIDE 400 (241.3 MG) MG PO TABS
400.0000 mg | ORAL_TABLET | Freq: Every day | ORAL | Status: DC
  Administered 2015-06-13 – 2015-06-14 (×2): 400 mg via ORAL
  Filled 2015-06-13 (×3): qty 1

## 2015-06-13 MED ORDER — VITAMIN B-1 100 MG PO TABS
100.0000 mg | ORAL_TABLET | Freq: Every day | ORAL | Status: DC
  Administered 2015-06-13 – 2015-06-15 (×3): 100 mg via ORAL
  Filled 2015-06-13 (×3): qty 1

## 2015-06-13 MED ORDER — MENTHOL 3 MG MT LOZG: 1.0000 | LOZENGE | OROMUCOSAL | Status: DC | PRN

## 2015-06-13 MED ORDER — LIDOCAINE HCL 1 % IJ SOLN
INTRAMUSCULAR | Status: AC
  Filled 2015-06-13: qty 20

## 2015-06-13 MED ORDER — SODIUM CHLORIDE 0.9 % IJ SOLN: 3.0000 mL | Freq: Two times a day (BID) | INTRAMUSCULAR | Status: DC

## 2015-06-13 MED ORDER — LACTATED RINGERS IV BOLUS (SEPSIS): 1000.0000 mL | Freq: Three times a day (TID) | INTRAVENOUS | Status: AC | PRN

## 2015-06-13 NOTE — Progress Notes (Signed)
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 Will plan on placing back on Eliquis most likely next am. Will discuss with radiologist.  Code Status: full Family Communication: no family at bedside  Disposition Plan: Per primary team   antibiotics:  Zosyn  HPI/Subjective: Pt has no new complaints, no acute issues overnight.  Objective: Filed Vitals:   06/13/15 1400  BP: 119/73  Pulse: 62  Temp: 97 F (36.1 C)  Resp: 16    Intake/Output Summary (Last 24 hours) at 06/13/15 1553 Last data filed at 06/13/15 1354  Gross per 24 hour  Intake 4157.15 ml  Output    160 ml  Net 3997.15 ml   Filed Weights   06/11/15 2300  Weight: 68.3 kg (150 lb 9.2 oz)    Exam:   General:  Pt in nad, alert and awake  Cardiovascular: no cyanosis  Respiratory: no increased wob, no wheezes, equal chest rise   Abdomen: Soft, nondistended, no guarding  Musculoskeletal: No cyanosis or clubbing    Data Reviewed: Basic Metabolic Panel:  Recent Labs Lab 06/10/15 1241 06/11/15 1132 06/12/15 0519 06/12/15 1246 06/13/15 0530  NA 128* 128* 129*  --  128*  K 3.3* 3.5 3.4*  --  3.9  CL  --  88* 94*  --  95*  CO2 '27 30 24  '$ --  25  GLUCOSE 117 108* 77  --  86  BUN 12.'9 12 12  '$ --  8  CREATININE 0.8 0.79 0.78  --  0.80  CALCIUM 9.2 8.7* 8.6*  --  8.4*  MG  --   --   --  1.5* 1.9    Liver Function Tests:  Recent Labs Lab 06/10/15 1241 06/11/15 1132 06/12/15 0519 06/13/15 0530  AST '18 24 23 20  '$ ALT '16 17 19 '$ 16*  ALKPHOS 94 84 81 68  BILITOT 0.74 1.0 1.1 0.7  PROT 6.5 6.8 6.1* 5.7*  ALBUMIN 3.4* 3.7 3.4* 3.2*    Recent Labs Lab 06/11/15 1132  LIPASE <10*   No results for input(s): AMMONIA in the last 168 hours. CBC:  Recent Labs Lab 06/10/15 1240 06/11/15 1132 06/12/15 0519 06/12/15 2305  WBC 7.5 6.3 5.5 4.3  NEUTROABS 6.0 4.2  --   --   HGB 13.3 12.7* 12.6* 12.0*  HCT 38.3* 35.7* 35.2* 33.7*  MCV 93.6 90.2 90.0 90.6  PLT 316 340 340 363   Cardiac Enzymes: No results for input(s): CKTOTAL, CKMB, CKMBINDEX, TROPONINI in the last 168 hours. BNP (last 3 results) No results for input(s): BNP in the last 8760 hours.  ProBNP (last 3 results)  Recent Labs  07/27/14 0347  PROBNP 1683.0*    CBG: No results for input(s): GLUCAP in the last 168 hours.  No results found for this or any previous visit (from the past 240 hour(s)).   Studies: Nm Hepatobiliary Liver Func  06/11/2015   CLINICAL DATA:  Right upper quadrant abdominal pain, nausea and vomiting intermittently for  the past 2 weeks. Cholelithiasis and gallbladder sludge and possible gallbladder mass at ultrasound today.  EXAM: NUCLEAR MEDICINE HEPATOBILIARY IMAGING  TECHNIQUE: Sequential images of the abdomen were obtained out to 60 minutes following intravenous administration of radiopharmaceutical.  RADIOPHARMACEUTICALS:  5.1 mCi Tc-86m Choletec IV  COMPARISON:  Abdomen ultrasound obtained today and abdomen CT dated 06/09/2015.  FINDINGS: Normal visualization of the liver, bile ducts and small bowel. The gallbladder was not visualized 1 hour after injection. Therefore, the patient was given 2.8 mg of morphine and an additional 1 mCi of technetium [id number] 1 hour after injection. This resulted in faint visualization of the gallbladder.  IMPRESSION: Delayed visualization of the  gallbladder, only seen after administration of 2.8 mg of morphine. No cystic duct obstruction.   Electronically Signed   By: StClaudie Revering.D.   On: 06/11/2015 17:50   Ir Perc Cholecystostomy  06/13/2015   CLINICAL DATA:  Acute cholecystitis  EXAM: PERCUTANEOUS CHOLECYSTOSTOMY TUBE PLACEMENT WITH ULTRASOUND AND FLUOROSCOPIC GUIDANCE:  FLUOROSCOPY TIME:  6 seconds, 1 mGy  TECHNIQUE: The procedure, risks (including but not limited to bleeding, infection, organ damage ), benefits, and alternatives were explained to the patient. Questions regarding the procedure were encouraged and answered. The patient understands and consents to the procedure. Survey ultrasound of the abdomen was performed and an appropriate skin entry site was identified. Skin site was marked, prepped with Betadine, and draped in usual sterile fashion, and infiltrated locally with 1% lidocaine.  Intravenous Fentanyl and Versed were administered as conscious sedation during continuous cardiorespiratory monitoring by the radiology RN, with a total moderate sedation time of 8 minutes.  Under real-time ultrasound guidance, gallbladder was accessed using a subcostal approach with a 19 gauge needle. Ultrasound image documentation was saved. Bile returned through the hub. Needle was exchanged over a guidewire for transitional dilator which allowed placement of 035 J wire. Over this, a 10.2 French pigtail catheter was advanced and formed centrally in the gallbladder lumen. 20 mL of purulent bile were aspirated, sent for culture. Small contrast injection confirmed appropriate position on fluoroscopy. Catheter secured externally with 0 Prolene suture and placed external drain bag. Patient tolerated the procedure well.  COMPLICATIONS: COMPLICATIONS none  IMPRESSION: 1. Technically successful percutaneous cholecystostomy tube placement with ultrasound and fluoroscopic guidance.   Electronically Signed   By: D Lucrezia Europe.D.   On: 06/13/2015 11:25     Scheduled Meds: . acidophilus  1 capsule Oral Daily  . demeclocycline  150 mg Oral BID  . diltiazem  180 mg Oral Q1200  . fentaNYL      . furosemide  40 mg Oral Daily  . lidocaine      . lip balm  1 application Topical BID  . magnesium oxide  400 mg Oral Daily  . midazolam      . multivitamin with minerals  1 tablet Oral Daily  . pantoprazole  40 mg Oral q morning - 10a  . piperacillin-tazobactam (ZOSYN)  IV  3.375 g Intravenous 3 times per day  . potassium chloride SA  20 mEq Oral Daily  . sodium chloride  3 mL Intravenous Q12H  . sucralfate  1 g Oral TID WC & HS  . tamsulosin  0.4 mg Oral Daily  . thiamine  100 mg Oral Daily   Continuous Infusions:      Time spent: > 10 minutes    VEVelvet Bathe[facility] Pager [phone] 7PM-7AM, please contact night-coverage at [email or web address], password [email or web address]/08/2015,  3:53 PM  LOS: 2 days

## 2015-06-13 NOTE — Progress Notes (Signed)
[address] [place], [place] [address] Phone: [phone] FAX: [phone]   Anthony Curry [id number] 1941-01-06   Problem List:   Active Problems:   Hypertensive cardiovascular disease   BPH (benign prostatic hyperplasia)   GERD (gastroesophageal reflux disease)   Lung cancer   Acute cholecystitis   Atrial fibrillation       Assessment  Acute cholecystitis   Plan:  Acute cholecystitis -cholecystostomy today per IR which is to remain in place for 6-8 weeks. We will discuss with Dr. [name] regarding the patients prognosis and determine whether he would be a surgical candidate in the future. 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 S/p palliative radiation to left adrenal gland 1/16   Right breast enlargement-further work up per Dr. [name]   Atrial fibrillation-Eliquis on hold since 7/7 AM. On heparin drip. C/w home meds.  Hyponatremia-Prob fluid overload & Lasix - defer to medicine but hold off on ##IVF since lung CA pt on diuretics  Hypokalemia-replace  hypoMG - Mg replacing  Hypertension-continue home meds  Dispo-continue inpatient  [name], M.D., F.A.C.S. Gastrointestinal and Minimally Invasive Surgery [facility] [address] [place], [place] [address] [phone] Main / Paging   06/13/2015  Subjective:  Getting drain plac ed No mjr events  Objective:  Vital signs:  Filed Vitals:   06/13/15 0948 06/13/15 0950 06/13/15 0955 06/13/15 0958  BP: 142/68 132/70 127/71 130/70  Pulse: 54 54 54 58  Temp:      TempSrc:      Resp: 15 16 12 18   Height:      Weight:      SpO2: 100% 100% 100% 100%    Last BM Date: 06/12/15 (per pt)  Intake/Output   Yesterday:  07/08 0701 - 07/09 0700 In: 6378.5 [P.O.:720;  I.V.:3817.2; IV Piggyback:150] Out: -  This shift:     Bowel function:  Flatus: y  BM: y  Drain: pending  Physical Exam:  General: Pt awake/alert/oriented x4 in no acute distress Eyes: PERRL, normal EOM.  Sclera clear.  No icterus Neuro: CN II-XII intact w/o focal sensory/motor deficits. Lymph: No head/neck/groin lymphadenopathy Psych:  No delerium/psychosis/paranoia HENT: Normocephalic, Mucus membranes moist.  No thrush Neck: Supple, No tracheal deviation Chest: No chest wall pain w good excursion.  Effort fair CV:  Pulses intact.  Regular rhythm MS: Normal AROM mjr joints.  No obvious deformity Abdomen: Soft.  Nondistended.  Mildly tender at RUQ only.  No evidence of peritonitis.  No incarcerated hernias. Ext:  SCDs BLE.  No mjr edema.  No cyanosis Skin: No petechiae / purpura  Results:   Labs: Results for orders placed or performed during the hospital encounter of 06/11/15 (from the past 48 hour(s))  Comprehensive metabolic panel     Status: Abnormal   Collection Time: 06/11/15 11:32 AM  Result Value Ref Range   Sodium 128 (L) 135 - 145 mmol/L   Potassium 3.5 3.5 - 5.1 mmol/L   Chloride 88 (L) 101 - 111 mmol/L   CO2 30 22 - 32 mmol/L   Glucose, Bld 108 (H) 65 - 99 mg/dL   BUN 12 6 - 20 mg/dL   Creatinine, Ser 0.79 0.61 - 1.24 mg/dL   Calcium 8.7 (L) 8.9 - 10.3 mg/dL   Total Protein 6.8 6.5 - 8.1 g/dL   Albumin  3.7 3.5 - 5.0 g/dL   AST 24 15 - 41 U/L   ALT 17 17 - 63 U/L   Alkaline Phosphatase 84 38 - 126 U/L   Total Bilirubin 1.0 0.3 - 1.2 mg/dL   GFR calc non Af Amer >60 >60 mL/min   GFR calc Af Amer >60 >60 mL/min    Comment: (NOTE) The eGFR has been calculated using the CKD EPI equation. This calculation has not been validated in all clinical situations. eGFR's persistently <60 mL/min signify possible Chronic Kidney Disease.    Anion gap 10 5 - 15  Lipase, blood     Status: Abnormal   Collection Time: 06/11/15 11:32 AM  Result Value Ref Range   Lipase  <10 (L) 22 - 51 U/L  CBC with Differential     Status: Abnormal   Collection Time: 06/11/15 11:32 AM  Result Value Ref Range   WBC 6.3 4.0 - 10.5 K/uL   RBC 3.96 (L) 4.22 - 5.81 MIL/uL   Hemoglobin 12.7 (L) 13.0 - 17.0 g/dL   HCT 35.7 (L) 39.0 - 52.0 %   MCV 90.2 78.0 - 100.0 fL   MCH 32.1 26.0 - 34.0 pg   MCHC 35.6 30.0 - 36.0 g/dL   RDW 13.3 11.5 - 15.5 %   Platelets 340 150 - 400 K/uL   Neutrophils Relative % 67 43 - 77 %   Neutro Abs 4.2 1.7 - 7.7 K/uL   Lymphocytes Relative 14 12 - 46 %   Lymphs Abs 0.9 0.7 - 4.0 K/uL   Monocytes Relative 14 (H) 3 - 12 %   Monocytes Absolute 0.9 0.1 - 1.0 K/uL   Eosinophils Relative 5 0 - 5 %   Eosinophils Absolute 0.3 0.0 - 0.7 K/uL   Basophils Relative 0 0 - 1 %   Basophils Absolute 0.0 0.0 - 0.1 K/uL  APTT     Status: Abnormal   Collection Time: 06/11/15  7:05 PM  Result Value Ref Range   aPTT 45 (H) 24 - 37 seconds    Comment:        IF BASELINE aPTT IS ELEVATED, SUGGEST PATIENT RISK ASSESSMENT BE USED TO DETERMINE APPROPRIATE ANTICOAGULANT THERAPY.   Heparin level (unfractionated)     Status: Abnormal   Collection Time: 06/11/15  7:05 PM  Result Value Ref Range   Heparin Unfractionated >2.20 (H) 0.30 - 0.70 IU/mL    Comment: RESULTS CONFIRMED BY MANUAL DILUTION CALLED TO CHELSEA RN @ 2114        IF HEPARIN RESULTS ARE BELOW EXPECTED VALUES, AND PATIENT DOSAGE HAS BEEN CONFIRMED, SUGGEST FOLLOW UP TESTING OF ANTITHROMBIN III LEVELS. CORRECTED ON 07/07 AT 2119: PREVIOUSLY REPORTED AS >2.20        IF HEPARIN RESULTS ARE BELOW EXPECTED VALUES, AND PATIENT DOSAGE HAS BEEN CONFIRMED, SUGGEST FOLLOW UP TESTING OF ANTITHROMBIN III LEVELS. CALLED TO [place] RN @ 2114, CORRECTED ON 07/07 AT  2113: PREVIOUSLY REPORTED AS 3.04        IF HEPARIN RESULTS ARE BELOW EXPECTED VALUES, AND PATIENT DOSAGE HAS BEEN CONFIRMED, SUGGEST FOLLOW UP TESTING OF ANTITHROMBIN III LEVELS. RESULTS CONFIRMED BY MANUAL DILUTION   Comprehensive metabolic panel      Status: Abnormal   Collection Time: 06/12/15  5:19 AM  Result Value Ref Range   Sodium 129 (L) 135 - 145 mmol/L   Potassium 3.4 (L) 3.5 - 5.1 mmol/L   Chloride 94 (L) 101 - 111 mmol/L   CO2 24 22 - 32  mmol/L   Glucose, Bld 77 65 - 99 mg/dL   BUN 12 6 - 20 mg/dL   Creatinine, Ser 0.78 0.61 - 1.24 mg/dL   Calcium 8.6 (L) 8.9 - 10.3 mg/dL   Total Protein 6.1 (L) 6.5 - 8.1 g/dL   Albumin 3.4 (L) 3.5 - 5.0 g/dL   AST 23 15 - 41 U/L   ALT 19 17 - 63 U/L   Alkaline Phosphatase 81 38 - 126 U/L   Total Bilirubin 1.1 0.3 - 1.2 mg/dL   GFR calc non Af Amer >60 >60 mL/min   GFR calc Af Amer >60 >60 mL/min    Comment: (NOTE) The eGFR has been calculated using the CKD EPI equation. This calculation has not been validated in all clinical situations. eGFR's persistently <60 mL/min signify possible Chronic Kidney Disease.    Anion gap 11 5 - 15  CBC     Status: Abnormal   Collection Time: 06/12/15  5:19 AM  Result Value Ref Range   WBC 5.5 4.0 - 10.5 K/uL   RBC 3.91 (L) 4.22 - 5.81 MIL/uL   Hemoglobin 12.6 (L) 13.0 - 17.0 g/dL   HCT 35.2 (L) 39.0 - 52.0 %   MCV 90.0 78.0 - 100.0 fL   MCH 32.2 26.0 - 34.0 pg   MCHC 35.8 30.0 - 36.0 g/dL   RDW 13.4 11.5 - 15.5 %   Platelets 340 150 - 400 K/uL  Protime-INR     Status: Abnormal   Collection Time: 06/12/15  5:19 AM  Result Value Ref Range   Prothrombin Time 15.9 (H) 11.6 - 15.2 seconds   INR 1.26 0.00 - 1.49  Heparin level (unfractionated)     Status: Abnormal   Collection Time: 06/12/15  5:29 AM  Result Value Ref Range   Heparin Unfractionated 2.06 (H) 0.30 - 0.70 IU/mL    Comment: RESULTS CONFIRMED BY MANUAL DILUTION        IF HEPARIN RESULTS ARE BELOW EXPECTED VALUES, AND PATIENT DOSAGE HAS BEEN CONFIRMED, SUGGEST FOLLOW UP TESTING OF ANTITHROMBIN III LEVELS.   APTT     Status: Abnormal   Collection Time: 06/12/15  5:45 AM  Result Value Ref Range   aPTT 70 (H) 24 - 37 seconds    Comment:        IF BASELINE aPTT IS  ELEVATED, SUGGEST PATIENT RISK ASSESSMENT BE USED TO DETERMINE APPROPRIATE ANTICOAGULANT THERAPY.   Heparin level (unfractionated)     Status: Abnormal   Collection Time: 06/12/15 12:46 PM  Result Value Ref Range   Heparin Unfractionated 1.70 (H) 0.30 - 0.70 IU/mL    Comment: RESULTS CONFIRMED BY MANUAL DILUTION        IF HEPARIN RESULTS ARE BELOW EXPECTED VALUES, AND PATIENT DOSAGE HAS BEEN CONFIRMED, SUGGEST FOLLOW UP TESTING OF ANTITHROMBIN III LEVELS.   APTT     Status: Abnormal   Collection Time: 06/12/15 12:46 PM  Result Value Ref Range   aPTT 104 (H) 24 - 37 seconds    Comment:        IF BASELINE aPTT IS ELEVATED, SUGGEST PATIENT RISK ASSESSMENT BE USED TO DETERMINE APPROPRIATE ANTICOAGULANT THERAPY. REPEATED TO VERIFY RESULT CHECKED   Magnesium     Status: Abnormal   Collection Time: 06/12/15 12:46 PM  Result Value Ref Range   Magnesium 1.5 (L) 1.7 - 2.4 mg/dL  Heparin level (unfractionated)     Status: Abnormal   Collection Time: 06/12/15 11:00 PM  Result Value Ref Range  Heparin Unfractionated 0.74 (H) 0.30 - 0.70 IU/mL    Comment:        IF HEPARIN RESULTS ARE BELOW EXPECTED VALUES, AND PATIENT DOSAGE HAS BEEN CONFIRMED, SUGGEST FOLLOW UP TESTING OF ANTITHROMBIN III LEVELS.   APTT     Status: Abnormal   Collection Time: 06/12/15 11:00 PM  Result Value Ref Range   aPTT 66 (H) 24 - 37 seconds    Comment:        IF BASELINE aPTT IS ELEVATED, SUGGEST PATIENT RISK ASSESSMENT BE USED TO DETERMINE APPROPRIATE ANTICOAGULANT THERAPY.   CBC     Status: Abnormal   Collection Time: 06/12/15 11:05 PM  Result Value Ref Range   WBC 4.3 4.0 - 10.5 K/uL   RBC 3.72 (L) 4.22 - 5.81 MIL/uL   Hemoglobin 12.0 (L) 13.0 - 17.0 g/dL   HCT 33.7 (L) 39.0 - 52.0 %   MCV 90.6 78.0 - 100.0 fL   MCH 32.3 26.0 - 34.0 pg   MCHC 35.6 30.0 - 36.0 g/dL   RDW 13.4 11.5 - 15.5 %   Platelets 363 150 - 400 K/uL  Comprehensive metabolic panel     Status: Abnormal   Collection  Time: 06/13/15  5:30 AM  Result Value Ref Range   Sodium 128 (L) 135 - 145 mmol/L   Potassium 3.9 3.5 - 5.1 mmol/L   Chloride 95 (L) 101 - 111 mmol/L   CO2 25 22 - 32 mmol/L   Glucose, Bld 86 65 - 99 mg/dL   BUN 8 6 - 20 mg/dL   Creatinine, Ser 0.80 0.61 - 1.24 mg/dL   Calcium 8.4 (L) 8.9 - 10.3 mg/dL   Total Protein 5.7 (L) 6.5 - 8.1 g/dL   Albumin 3.2 (L) 3.5 - 5.0 g/dL   AST 20 15 - 41 U/L   ALT 16 (L) 17 - 63 U/L   Alkaline Phosphatase 68 38 - 126 U/L   Total Bilirubin 0.7 0.3 - 1.2 mg/dL   GFR calc non Af Amer >60 >60 mL/min   GFR calc Af Amer >60 >60 mL/min    Comment: (NOTE) The eGFR has been calculated using the CKD EPI equation. This calculation has not been validated in all clinical situations. eGFR's persistently <60 mL/min signify possible Chronic Kidney Disease.    Anion gap 8 5 - 15  Magnesium     Status: None   Collection Time: 06/13/15  5:30 AM  Result Value Ref Range   Magnesium 1.9 1.7 - 2.4 mg/dL    Imaging / Studies: Nm Hepatobiliary Liver Func  06/11/2015   CLINICAL DATA:  Right upper quadrant abdominal pain, nausea and vomiting intermittently for the past 2 weeks. Cholelithiasis and gallbladder sludge and possible gallbladder mass at ultrasound today.  EXAM: NUCLEAR MEDICINE HEPATOBILIARY IMAGING  TECHNIQUE: Sequential images of the abdomen were obtained out to 60 minutes following intravenous administration of radiopharmaceutical.  RADIOPHARMACEUTICALS:  5.1 mCi Tc-6m Choletec IV  COMPARISON:  Abdomen ultrasound obtained today and abdomen CT dated 06/09/2015.  FINDINGS: Normal visualization of the liver, bile ducts and small bowel. The gallbladder was not visualized 1 hour after injection. Therefore, the patient was given 2.8 mg of morphine and an additional 1 mCi of technetium [id number] 1 hour after injection. This resulted in faint visualization of the gallbladder.  IMPRESSION: Delayed visualization of the gallbladder, only seen after administration of  2.8 mg of morphine. No cystic duct obstruction.   Electronically Signed   By: StClaudie Revering  M.D.   On: 06/11/2015 17:50   US Abdomen Complete  06/11/2015   CLINICAL DATA:  Abnormal gallbladder on CT  EXAM: ULTRASOUND ABDOMEN COMPLETE  COMPARISON:  CT abdomen pelvis 06/09/2015  FINDINGS: Gallbladder: Distended gallbladder containing gallstones and extensive sludge. Gallbladder wall is thickened at 5.4 mm. Negative sonographic Murphy sign.  There is focal soft tissue thickening of the lateral wall of the gallbladder measuring 14 mm in thickness raising the possibility of a mass lesion. This is difficult difficult to confirm on the CT scan.  Common bile duct: Diameter: 3.6 mm  Liver: 9 mm cyst.  No other liver mass lesion.  IVC: No abnormality visualized.  Pancreas: Not visualized  Spleen: Size and appearance within normal limits.  Right Kidney: Length: 10.7 cm. Echogenicity within normal limits. No mass or hydronephrosis visualized.  Left Kidney: Length: 10.9 cm. Echogenicity within normal limits. No mass or hydronephrosis visualized.  Abdominal aorta: No aneurysm visualized.  Other findings: None.  IMPRESSION: Distended gallbladder containing gallstones and sludge. Gallbladder wall thickening. The patient is nontender over the gallbladder. Possible acute cholecystitis. The patient currently has a normal white blood count. There is a possible mass the lateral gallbladder which could be due to carcinoma versus sludge or inflammation.   Electronically Signed   By: Franchot Gallo M.D.   On: 06/11/2015 12:29    Medications / Allergies: per chart  Antibiotics: Anti-infectives    Start     Dose/Rate Route Frequency Ordered Stop   06/11/15 2200  demeclocycline (DECLOMYCIN) tablet 150 mg     150 mg Oral 2 times daily 06/11/15 1459     06/11/15 1415  piperacillin-tazobactam (ZOSYN) IVPB 3.375 g     3.375 g 12.5 mL/hr over 240 Minutes Intravenous 3 times per day 06/11/15 1409          Note: Portions of this  report may have been transcribed using voice recognition software. Every effort was made to ensure accuracy; however, inadvertent computerized transcription errors may be present.   Any transcriptional errors that result from this process are unintentional.     [name], M.D., F.A.C.S. Gastrointestinal and Minimally Invasive Surgery [facility] [address] [place], [place] [address] [phone] Main / Paging   06/13/2015  CARE TEAM:  PCP: [name], MD  Outpatient Care Team: Patient Care Team: [name], MD as PCP - General (Family Medicine)  Inpatient Treatment Team: Treatment Team: Attending Provider: Nolon Nations, MD; Consulting Physician: Md Edison Pace, MD; Registered Nurse: Dala Dock, RN; Rounding Team: Garner Gavel, MD; Technician: Wilder Glade, NT; Registered Nurse: Sharma Covert, RN; Registered Nurse: Hoyle Barr, RN

## 2015-06-13 NOTE — Procedures (Signed)
46fperc chole placed No complication No blood loss. [facility]

## 2015-06-13 NOTE — Progress Notes (Signed)
ANTICOAGULATION CONSULT NOTE - Follow Up Consult  Pharmacy Consult for Heparin Indication: atrial fibrillation  Allergies  Allergen Reactions  . Aspirin Other (See Comments)    Pt can not have Asprin due to medical conditions according to spouse    Patient Measurements: Height: '5\' 8"'$  (172.7 cm) Weight: 150 lb 9.2 oz (68.3 kg) IBW/kg (Calculated) : 68.4 Heparin Dosing Weight:   Vital Signs: Temp: 97.7 F (36.5 C) (07/09 0538) Temp Source: Oral (07/09 0538) BP: 130/62 mmHg (07/09 0538) Pulse Rate: 58 (07/09 0538)  Labs:  Recent Labs  06/11/15 1132  06/12/15 0519 06/12/15 0529 06/12/15 0545 06/12/15 1246 06/12/15 2300 06/12/15 2305 06/13/15 0530  HGB 12.7*  --  12.6*  --   --   --   --  12.0*  --   HCT 35.7*  --  35.2*  --   --   --   --  33.7*  --   PLT 340  --  340  --   --   --   --  363  --   APTT  --   < >  --   --  70* 104* 66*  --   --   LABPROT  --   --  15.9*  --   --   --   --   --   --   INR  --   --  1.26  --   --   --   --   --   --   HEPARINUNFRC  --   < >  --  2.06*  --  1.70* 0.74*  --   --   CREATININE 0.79  --  0.78  --   --   --   --   --  0.80  < > = values in this interval not displayed.  Estimated Creatinine Clearance: 77.1 mL/min (by C-G formula based on Cr of 0.8).   Medications:  Infusions:  . 0.9 % NaCl with KCl 20 mEq / L 100 mL/hr at 06/13/15 0302    Assessment: Patient with heparin level above goal, but PTA apixaban is believed to be falsely increasing level.  Patient with PTT at goal.  No heparin issues per RN.  Heparin turned off at 0300 as per prior order.  Goal of Therapy:  Heparin level 0.3-0.7 units/ml aPTT 66-102 seconds Monitor platelets by anticoagulation protocol: Yes   Plan:  Will follow up with if/when heparin is restarted.  Tyler Deis, Shea Stakes [place] 06/13/2015,6:47 AM

## 2015-06-13 NOTE — Progress Notes (Signed)
Patient arrived to room from procedure.  Denies pain.  Denies nausea.  A&O x4.  Wife at bedside.  Room air.  No complaints.

## 2015-06-14 MED ORDER — APIXABAN 5 MG PO TABS
5.0000 mg | ORAL_TABLET | Freq: Two times a day (BID) | ORAL | Status: DC
  Administered 2015-06-14 – 2015-06-15 (×3): 5 mg via ORAL
  Filled 2015-06-14 (×4): qty 1

## 2015-06-14 MED ORDER — ENSURE ENLIVE PO LIQD
237.0000 mL | Freq: Two times a day (BID) | ORAL | Status: DC
  Administered 2015-06-14 (×2): 237 mL via ORAL

## 2015-06-14 NOTE — Discharge Instructions (Signed)
DRAIN CARE:   You have a closed bulb drain to help you heal.  A bulb drain is a small, plastic reservoir which creates a gentle suction. It is used to remove excess fluid from a surgical wound. The color and amount of fluid will vary. Immediately after surgery, the fluid is bright red. It may gradually change to a yellow color. When the amount decreases to about 1 or 2 tablespoons (15 to 30 cc) per 24 hours, your caregiver will usually remove it.  DAILY CARE  Keep the bulb compressed at all times, except while emptying it. The compression creates suction.   Keep sites where the tubes enter the skin dry and covered with a light bandage (dressing).   Tape the tubes to your skin, 1 to 2 inches below the insertion sites, to keep from pulling on your stitches. Tubes are stitched in place and will not slip out.   Pin the bulb to your shirt (not to your pants) with a safety pin.   For the first few days after surgery, there usually is more fluid in the bulb. Empty the bulb whenever it becomes half full because the bulb does not create enough suction if it is too full. Include this amount in your 24 hour totals.   When the amount of drainage decreases, empty the bulb at the same time every day. Write down the amounts and the 24 hour totals. Your caregiver will want to know them. This helps your caregiver know when the tubes can be removed.   (We anticipate removing the drain in 1-3 weeks, depending on when the output is <64m a day for 2+ days)  If there is drainage around the tube sites, change dressings and keep the area dry. If you see a clot in the tube, leave it alone. However, if the tube does not appear to be draining, let your caregiver know.  TO EMPTY THE BULB  Open the stopper to release suction.   Holding the stopper out of the way, pour drainage into the measuring cup that was sent home with you.   Measure and write down the amount. If there are 2 bulbs, note the amount of drainage  from bulb 1 or bulb 2 and keep the totals separate. Your caregiver will want to know which tube is draining more.   Compress the bulb by folding it in half.   Replace the stopper.   Check the tape that holds the tube to your skin, and pin the bulb to your shirt.  SEEK MEDICAL CARE IF:  The drainage develops a bad odor.   You have an oral temperature above 102 F (38.9 C).   The amount of drainage from your wound suddenly increases or decreases.   You accidentally pull out your drain.   You have any other questions or concerns.  MAKE SURE YOU:   Understand these instructions.   Will watch your condition.   Will get help right away if you are not doing well or get worse.     Call our office if you have any questions about your drain. [phone]  Cholecystitis Cholecystitis is an inflammation of your gallbladder. It is usually caused by a buildup of gallstones or sludge (cholelithiasis) in your gallbladder. The gallbladder stores a fluid that helps digest fats (bile). Cholecystitis is serious and needs treatment right away.  CAUSES   Gallstones. Gallstones can block the tube that leads to your gallbladder, causing bile to build up. As bile builds  up, the gallbladder becomes inflamed.  Bile duct problems, such as blockage from scarring or kinking.  Tumors. Tumors can stop bile from leaving your gallbladder correctly, causing bile to build up. As bile builds up, the gallbladder becomes inflamed. SYMPTOMS   Nausea.  Vomiting.  Abdominal pain, especially in the upper right area of your abdomen.  Abdominal tenderness or bloating.  Sweating.  Chills.  Fever.  Yellowing of the skin and the whites of the eyes (jaundice). DIAGNOSIS  Your caregiver may order blood tests to look for infection or gallbladder problems. Your caregiver may also order imaging tests, such as an ultrasound or computed tomography (CT) scan. Further tests may include a hepatobiliary  iminodiacetic acid (HIDA) scan. This scan allows your caregiver to see your bile move from the liver to the gallbladder and to the small intestine. TREATMENT  A hospital stay is usually necessary to lessen the inflammation of your gallbladder. You may be required to not eat or drink (fast) for a certain amount of time. You may be given medicine to treat pain or an antibiotic medicine to treat an infection. Surgery may be needed to remove your gallbladder (cholecystectomy) once the inflammation has gone down. Surgery may be needed right away if you develop complications such as death of gallbladder tissue (gangrene) or a tear (perforation) of the gallbladder.  [place] care will depend on your treatment. In general:  If you were given antibiotics, take them as directed. Finish them even if you start to feel better.  Only take over-the-counter or prescription medicines for pain, discomfort, or fever as directed by your caregiver.  Follow a low-fat diet until you see your caregiver again.  Keep all follow-up visits as directed by your caregiver. SEEK IMMEDIATE MEDICAL CARE IF:   Your pain is increasing and not controlled by medicines.  Your pain moves to another part of your abdomen or to your back.  You have a fever.  You have nausea and vomiting. MAKE SURE YOU:  Understand these instructions.  Will watch your condition.  Will get help right away if you are not doing well or get worse. Document Released: 11/21/2005 Document Revised: 02/13/2012 Document Reviewed: 10/07/2011 [facility] Patient Information 2015 [place], [place]. This information is not intended to replace advice given to you by your health care provider. Make sure you discuss any questions you have with your health care provider.  While you are recovering from you infected gallbladder, try to stay on a Fat and Cholesterol Control Diet Fat and cholesterol levels in your blood and organs are influenced by  your diet. High levels of fat and cholesterol may lead to diseases of the heart, small and large blood vessels, gallbladder, liver, and pancreas. CONTROLLING FAT AND CHOLESTEROL WITH DIET Although exercise and lifestyle factors are important, your diet is key. That is because certain foods are known to raise cholesterol and others to lower it. The goal is to balance foods for their effect on cholesterol and more importantly, to replace saturated and trans fat with other types of fat, such as monounsaturated fat, polyunsaturated fat, and omega-3 fatty acids. On average, a person should consume no more than 15 to 17 g of saturated fat daily. Saturated and trans fats are considered "bad" fats, and they will raise LDL cholesterol. Saturated fats are primarily found in animal products such as meats, butter, and cream. However, that does not mean you need to give up all your favorite foods. Today, there are good tasting,  low-fat, low-cholesterol substitutes for most of the things you like to eat. Choose low-fat or nonfat alternatives. Choose round or loin cuts of red meat. These types of cuts are lowest in fat and cholesterol. Chicken (without the skin), fish, veal, and ground [place] breast are great choices. Eliminate fatty meats, such as hot dogs and salami. Even shellfish have little or no saturated fat. Have a 3 oz (85 g) portion when you eat lean meat, poultry, or fish. Trans fats are also called "partially hydrogenated oils." They are oils that have been scientifically manipulated so that they are solid at room temperature resulting in a longer shelf life and improved taste and texture of foods in which they are added. Trans fats are found in stick margarine, some tub margarines, cookies, crackers, and baked goods.  When baking and cooking, oils are a great substitute for butter. The monounsaturated oils are especially beneficial since it is believed they lower LDL and raise HDL. The oils you should avoid  entirely are saturated tropical oils, such as coconut and palm.  Remember to eat a lot from food groups that are naturally free of saturated and trans fat, including fish, fruit, vegetables, beans, grains (barley, rice, couscous, bulgur wheat), and pasta (without cream sauces).  IDENTIFYING FOODS THAT LOWER FAT AND CHOLESTEROL  Soluble fiber may lower your cholesterol. This type of fiber is found in fruits such as apples, vegetables such as broccoli, potatoes, and carrots, legumes such as beans, peas, and lentils, and grains such as barley. Foods fortified with plant sterols (phytosterol) may also lower cholesterol. You should eat at least 2 g per day of these foods for a cholesterol lowering effect.  Read package labels to identify low-saturated fats, trans fat free, and low-fat foods at the supermarket. Select cheeses that have only 2 to 3 g saturated fat per ounce. Use a heart-healthy tub margarine that is free of trans fats or partially hydrogenated oil. When buying baked goods (cookies, crackers), avoid partially hydrogenated oils. Breads and muffins should be made from whole grains (whole-wheat or whole oat flour, instead of "flour" or "enriched flour"). Buy non-creamy canned soups with reduced salt and no added fats.  FOOD PREPARATION TECHNIQUES  Never deep-fry. If you must fry, either stir-fry, which uses very little fat, or use non-stick cooking sprays. When possible, broil, bake, or roast meats, and steam vegetables. Instead of putting butter or margarine on vegetables, use lemon and herbs, applesauce, and cinnamon (for squash and sweet potatoes). Use nonfat yogurt, salsa, and low-fat dressings for salads.  LOW-SATURATED FAT / LOW-FAT FOOD SUBSTITUTES Meats / Saturated Fat (g)  Avoid: Steak, marbled (3 oz/85 g) / 11 g  Choose: Steak, lean (3 oz/85 g) / 4 g  Avoid: Hamburger (3 oz/85 g) / 7 g  Choose: Hamburger, lean (3 oz/85 g) / 5 g  Avoid: Ham (3 oz/85 g) / 6 g  Choose: Ham, lean cut  (3 oz/85 g) / 2.4 g  Avoid: Chicken, with skin, dark meat (3 oz/85 g) / 4 g  Choose: Chicken, skin removed, dark meat (3 oz/85 g) / 2 g  Avoid: Chicken, with skin, light meat (3 oz/85 g) / 2.5 g  Choose: Chicken, skin removed, light meat (3 oz/85 g) / 1 g Dairy / Saturated Fat (g)  Avoid: Whole milk (1 cup) / 5 g  Choose: Low-fat milk, 2% (1 cup) / 3 g  Choose: Low-fat milk, 1% (1 cup) / 1.5 g  Choose: Skim milk (1 cup) /  0.3 g  Avoid: Hard cheese (1 oz/28 g) / 6 g  Choose: Skim milk cheese (1 oz/28 g) / 2 to 3 g  Avoid: Cottage cheese, 4% fat (1 cup) / 6.5 g  Choose: Low-fat cottage cheese, 1% fat (1 cup) / 1.5 g  Avoid: Ice cream (1 cup) / 9 g  Choose: Sherbet (1 cup) / 2.5 g  Choose: Nonfat frozen yogurt (1 cup) / 0.3 g  Choose: Frozen fruit bar / trace  Avoid: Whipped cream (1 tbs) / 3.5 g  Choose: Nondairy whipped topping (1 tbs) / 1 g Condiments / Saturated Fat (g)  Avoid: Mayonnaise (1 tbs) / 2 g  Choose: Low-fat mayonnaise (1 tbs) / 1 g  Avoid: Butter (1 tbs) / 7 g  Choose: Extra light margarine (1 tbs) / 1 g  Avoid: Coconut oil (1 tbs) / 11.8 g  Choose: Olive oil (1 tbs) / 1.8 g  Choose: Corn oil (1 tbs) / 1.7 g  Choose: Safflower oil (1 tbs) / 1.2 g  Choose: Sunflower oil (1 tbs) / 1.4 g  Choose: Soybean oil (1 tbs) / 2.4 g  Choose: Canola oil (1 tbs) / 1 g Document Released: 11/21/2005 Document Revised: 03/18/2013 Document Reviewed: 02/19/2014 [employer] Patient Information 2015 [place], [place]. This information is not intended to replace advice given to you by your health care provider. Make sure you discuss any questions you have with your health care provider.  GETTING TO GOOD BOWEL HEALTH. Irregular bowel habits such as constipation and diarrhea can lead to many problems over time.  Having one soft bowel movement a day is the most important way to prevent further problems.  The anorectal canal is designed to handle stretching and feces to  safely manage our ability to get rid of solid waste (feces, poop, stool) out of our body.  BUT, hard constipated stools can act like ripping concrete bricks and diarrhea can be a burning fire to this very sensitive area of our body, causing inflamed hemorrhoids, anal fissures, increasing risk is perirectal abscesses, abdominal pain/bloating, an making irritable bowel worse.      The goal: ONE SOFT BOWEL MOVEMENT A DAY!  To have soft, regular bowel movements:   Drink plenty of fluids, consider 4-6 tall glasses of water a day.    Take plenty of fiber.  Fiber is the undigested part of plant food that passes into the colon, acting s natures broom to encourage bowel motility and movement.  Fiber can absorb and hold large amounts of water. This results in a larger, bulkier stool, which is soft and easier to pass. Work gradually over several weeks up to 6 servings a day of fiber (25g a day even more if needed) in the form of: o Vegetables -- Root (potatoes, carrots, turnips), leafy green (lettuce, salad greens, celery, spinach), or cooked high residue (cabbage, broccoli, etc) o Fruit -- Fresh (unpeeled skin & pulp), Dried (prunes, apricots, cherries, etc ),  or stewed ( applesauce)  o Whole grain breads, pasta, etc (whole wheat)  o Bran cereals   Bulking Agents -- This type of water-retaining fiber generally is easily obtained each day by one of the following:  o Psyllium bran -- The psyllium plant is remarkable because its ground seeds can retain so much water. This product is available as Metamucil, Konsyl, Effersyllium, Per Diem Fiber, or the less expensive generic preparation in drug and health food stores. Although labeled a laxative, it really is not a laxative.  o  Methylcellulose -- This is another fiber derived from wood which also retains water. It is available as Citrucel. o Polyethylene Glycol - and artificial fiber commonly called Miralax or Glycolax.  It is helpful for people with gassy or  bloated feelings with regular fiber o Flax Seed - a less gassy fiber than psyllium  No reading or other relaxing activity while on the toilet. If bowel movements take longer than 5 minutes, you are too constipated  AVOID CONSTIPATION.  High fiber and water intake usually takes care of this.  Sometimes a laxative is needed to stimulate more frequent bowel movements, but   Laxatives are not a good long-term solution as it can wear the colon out.  They can help jump-start bowels if constipated, but should be relied on constantly without discussing with your doctor o Osmotics (Milk of Magnesia, Fleets phosphosoda, Magnesium citrate, MiraLax, GoLytely) are safer than  o Stimulants (Senokot, Castor Oil, Dulcolax, Ex Lax)    o Avoid taking laxatives for more than 7 days in a row.   IF SEVERELY CONSTIPATED, try a Bowel Retraining Program: o Do not use laxatives.  o Eat a diet high in roughage, such as bran cereals and leafy vegetables.  o Drink six (6) ounces of prune or apricot juice each morning.  o Eat two (2) large servings of stewed fruit each day.  o Take one (1) heaping tablespoon of a psyllium-based bulking agent twice a day. Use sugar-free sweetener when possible to avoid excessive calories.  o Eat a normal breakfast.  o Set aside 15 minutes after breakfast to sit on the toilet, but do not strain to have a bowel movement.  o If you do not have a bowel movement by the third day, use an enema and repeat the above steps.   Controlling diarrhea o Switch to liquids and simpler foods for a few days to avoid stressing your intestines further. o Avoid dairy products (especially milk & ice cream) for a short time.  The intestines often can lose the ability to digest lactose when stressed. o Avoid foods that cause gassiness or bloating.  Typical foods include beans and other legumes, cabbage, broccoli, and dairy foods.  Every person has some sensitivity to other foods, so listen to our body and avoid  those foods that trigger problems for you. o Adding fiber (Citrucel, Metamucil, psyllium, Miralax) gradually can help thicken stools by absorbing excess fluid and retrain the intestines to act more normally.  Slowly increase the dose over a few weeks.  Too much fiber too soon can backfire and cause cramping & bloating. o Probiotics (such as active yogurt, Align, etc) may help repopulate the intestines and colon with normal bacteria and calm down a sensitive digestive tract.  Most studies show it to be of mild help, though, and such products can be costly. o Medicines: - Bismuth subsalicylate (ex. Kayopectate, Pepto Bismol) every 30 minutes for up to 6 doses can help control diarrhea.  Avoid if pregnant. - Loperamide (Immodium) can slow down diarrhea.  Start with two tablets ('4mg'$  total) first and then try one tablet every 6 hours.  Avoid if you are having fevers or severe pain.  If you are not better or start feeling worse, stop all medicines and call your doctor for advice o Call your doctor if you are getting worse or not better.  Sometimes further testing (cultures, endoscopy, X-ray studies, bloodwork, etc) may be needed to help diagnose and treat the cause of the diarrhea.  TROUBLESHOOTING IRREGULAR BOWELS 1) Avoid extremes of bowel movements (no bad constipation/diarrhea) 2) Miralax 17gm mixed in 8oz. water or juice-daily. May use BID as needed.  3) Gas-x,Phazyme, etc. as needed for gas & bloating.  4) Soft,bland diet. No spicy,greasy,fried foods.  5) Prilosec over-the-counter as needed  6) May hold gluten/wheat products from diet to see if symptoms improve.  7)  May try probiotics (Align, Activa, etc) to help calm the bowels down 7) If symptoms become worse call back immediately.  Managing Pain  Pain after surgery or related to activity is often due to strain/injury to muscle, tendon, nerves and/or incisions.  This pain is usually short-term and will improve in a few months.   Many people  find it helpful to do the following things TOGETHER to help speed the process of healing and to get back to regular activity more quickly:  1. Avoid heavy physical activity at first a. No lifting greater than 20 pounds at first, then increase to lifting as tolerated over the next few weeks b. Do not push through the pain.  Listen to your body and avoid positions and maneuvers than reproduce the pain.  Wait a few days before trying something more intense c. Walking is okay as tolerated, but go slowly and stop when getting sore.  If you can walk 30 minutes without stopping or pain, you can try more intense activity (running, jogging, aerobics, cycling, swimming, treadmill, sex, sports, weightlifting, etc ) d. Remember: If it hurts to do it, then dont do it!  2. Take Anti-inflammatory medication a. Choose ONE of the following over-the-counter medications: i.            Acetaminophen '500mg'$  tabs (Tylenol) 1-2 pills with every meal and just before bedtime (avoid if you have liver problems) ii.            Naproxen '220mg'$  tabs (ex. Aleve) 1-2 pills twice a day (avoid if you have kidney, stomach, IBD, or bleeding problems) iii. Ibuprofen '200mg'$  tabs (ex. Advil, Motrin) 3-4 pills with every meal and just before bedtime (avoid if you have kidney, stomach, IBD, or bleeding problems) b. Take with food/snack around the clock for 1-2 weeks i. This helps the muscle and nerve tissues become less irritable and calm down faster  3. Use a Heating pad or Ice/Cold Pack a. 4-6 times a day b. May use warm bath/hottub  or showers  4. Try Gentle Massage and/or Stretching  a. at the area of pain many times a day b. stop if you feel pain - do not overdo it  Try these steps together to help you body heal faster and avoid making things get worse.  Doing just one of these things may not be enough.    If you are not getting better after two weeks or are noticing you are getting worse, contact our office for further  advice; we may need to re-evaluate you & see what other things we can do to help.  Malnutrition Many of [place] think of malnutrition as a condition in which there is not enough to eat. Malnutrition is actually any condition where nutrition is poor. This means:  Too much to eat as we see in conditions of obesity.  Too little to eat with starvation. The following information is only for the malnourished with dietary deficiencies (poor diet). CAUSES  Under-nutrition can result from:  Poor intake.  Malabsorption  Lactation  Bleeding  Diarrhea  Old age.  Kidney failure.  Infancy  Poverty  Infection  Adolescence  Excessive sweating  Drug addiction  Pregnancy  Early childhood. Under-nutrition comes anytime the demand is more than the intake. SYMPTOMS  The problems depend on what type of malnutrition is present. Some general symptoms include:  Fatigue.  Dizziness.  Fainting  Weight loss.  Poor immune response.  Lack of menstruation.  Lack of growth in children.  Hair loss. DIAGNOSIS  Your caregiver will usually suspect malnutrition based on results of your:   Medical and dietary history.  Physical exam. This will often include measurements of your BMI (body mass index).  Perhaps some blood tests. These may include: plasma levels of nutrients and nutrient-dependent substances, such as:  Hemoglobin  Thyroid hormones  Transferrin  Albumin RISK FACTORS Persons in the following circumstances may be at risk of malnutrition.  Infants and children are at risk of under-nutrition. This is because of their high demand for energy and essential nutrients. Protein-energy malnutrition in children consuming inadequate amounts of protein, calories, and other nutrients is a particularly severe form of under-nutrition that delays growth and development. This includes [place] and [place].  Hemorrhagic disease of the newborn is a life-threatening disorder. This is  due to a lack of vitamin K, iron, folic acid, vitamin C, copper, zinc, and vitamin A. This may occur in inadequately fed infants and children.  In adolescence, nutritional requirements increase because they are growing. Anorexia nervosa, a form of starvation, may affect adolescents.  Pregnancy and lactation. Requirements for all nutrients are increased during pregnancy and lactation.  Abnormal diets, such as pica (the consumption of nonnutritive substances, such as clay and charcoal), are common in pregnancy.  Anemia due to folic acid deficiency is common in pregnant women. This is especially true for those who have taken oral contraceptives. Folic acid supplements are now recommended for pregnant women. Folic acid prevents neural tube defects (spina bifida) in children.  Breast-fed-only infants may develop vitamin B12 deficiency if the mother is a vegan.  An alcoholic mother may have a handicapped and stunted child with fetal alcohol syndrome. This is due to the effects of alcohol on the fetus. Do not drink during pregnancy.  Old age: A weakened sense of taste and smell, loneliness, physical and mental handicaps, immobility, and chronic illness can hurt the food intake in the elderly. Absorption is reduced. This may add to iron deficiency, calcium and bone problems and also a softening of the bones due to lack of vitamin D. This is also made worse by not being in the sun.  With aging, we loose lean body mass. These changes and a reduction in physical activity result in lower energy and protein requirements compared with those of younger adults.  Chronic disease including malabsorption states (including those resulting from surgery) tend to impair the absorption of fat-soluble vitamins, vitamin B12, calcium, and iron.  Liver disease impairs the storage of vitamins A and B12. It also interferes with the metabolism of protein and energy sources.  Kidney disease may cause deficiencies of protein,  iron, and vitamin D.  Cancer and AIDS may cause anorexia. This is a loss of appetite.  Vegetarian diet. The most common form of this type of diet is when meat and fish are not eaten, but eggs and dairy products are eaten. Iron deficiency is the only risk. Ovo-lacto vegetarians tend to live longer and to develop fewer chronic disabling conditions than their meat-eating peers. However, their lifestyle usually includes regular exercise and abstention from alcohol and tobacco. This may contribute to  better health. Vegans consume no animal products and are susceptible to vitamin B12 deficiency. Yeast extracts and oriental-style fermented foods provide this vitamin. Intake of calcium, iron, and zinc also tends to be low. A fruitarian diet (eat only fruit) is deficient in protein, salt, and many micronutrients. This is not recommended.  Fad diets: Many commercial diets are claimed to enhance well-being or reduce weight. A physician should be alert to early evidence of nutrient deficiency or toxicity in patients on these diets. Such diets have resulted in vitamin, mineral, and protein deficiency states and cardiac, renal, and metabolic disorders. Some fad diets have resulted in death. People on very low calorie diets (less than 400 kcal/day) cannot sustain health for long. Some trace mineral supplements have induced toxicity.  Alcohol or drug dependency: Addiction leads to a troubled lifestyle in which adequate nourishment is ignored. Absorption and metabolism of nutrients are impaired. High levels of alcohol are poisonous. Too much alcohol can cause tissue injury, particularly of the GI tract, liver, pancreas, brain, and peripheral nervous system. Beer drinkers who consume food may gain weight, but alcoholics who use more than one quart of hard liquor per day lose weight and become undernourished. Drug addicts are usually very skinny. Alcoholism is the most common cause of thiamine deficiency and may lead to  deficiencies of magnesium, zinc, and other vitamins. TREATMENT  Get treatment if you experience changes in how your body is working.  PREVENTION  Eating a good, well-balanced diet helps to prevent most forms of malnutrition. Document Released: 10/07/2005 Document Revised: 02/13/2012 Document Reviewed: 10/29/2007 [facility] Patient Information 2015 [place], [place]. This information is not intended to replace advice given to you by your health care provider. Make sure you discuss any questions you have with your health care provider.

## 2015-06-14 NOTE — Progress Notes (Signed)
Patient ID: Anthony Curry, male   DOB: March 25, 1941, 74 y.o.   MRN: [id number]     Referring Physician(s): CCS  Subjective:  Anthony Curry feels much better today after perc chole. He is tolerating clear liquids He states his abdominal pain has resolved.  Allergies: Aspirin  Medications: Prior to Admission medications   Medication Sig Start Date End Date Taking? Authorizing Provider  ALPRAZolam (XANAX) 0.25 MG tablet Take 0.25 mg by mouth 3 (three) times daily as needed for anxiety or sleep.   Yes Historical Provider, MD  apixaban (ELIQUIS) 5 MG TABS tablet Take 1 tablet (5 mg total) by mouth 2 (two) times daily. 12/12/14  Yes Thompson Grayer, MD  Ascorbic Acid (VITAMIN C PO) Take 1,000 mg by mouth daily.    Yes Historical Provider, MD  B Complex-C (SUPER B COMPLEX PO) Take 1 each by mouth daily.    Yes Historical Provider, MD  Biotin 7500 MCG TABS Take 1 tablet by mouth daily.    Yes Historical Provider, MD  calcium carbonate (OS-CAL) 600 MG TABS Take 600 mg by mouth daily with breakfast.   Yes Historical Provider, MD  demeclocycline (DECLOMYCIN) 150 MG tablet Take 150 mg by mouth 2 (two) times daily.   Yes Historical Provider, MD  diltiazem (CARDIZEM CD) 180 MG 24 hr capsule Take 1 capsule (180 mg total) by mouth daily at 12 noon. 01/09/15  Yes Sherran Needs, NP  furosemide (LASIX) 40 MG tablet Take 1 tablet (40 mg total) by mouth daily. 05/11/15  Yes Thompson Grayer, MD  HYDROcodone-acetaminophen (NORCO/VICODIN) 5-325 MG per tablet Take 1 tablet by mouth every 6 (six) hours as needed. Patient taking differently: Take 1 tablet by mouth every 6 (six) hours as needed for severe pain.  06/01/15  Yes Ankit Nanavati, MD  KLOR-CON M20 20 MEQ tablet Take 20 mEq by mouth daily.  10/18/14  Yes Historical Provider, MD  Lactobacillus (ACIDOPHILUS) CAPS capsule Take 1 capsule by mouth daily.   Yes Historical Provider, MD  MAGNESIUM PO Take 400 mg by mouth daily.    Yes Historical Provider, MD  metoprolol  tartrate (LOPRESSOR) 25 MG tablet Take 0.5 tablets (12.5 mg total) by mouth 2 (two) times daily. Patient taking differently: Take 6.25 mg by mouth 2 (two) times daily.  05/11/15  Yes Thompson Grayer, MD  mirtazapine (REMERON) 15 MG tablet Take 1 tablet (15 mg total) by mouth at bedtime. Patient taking differently: Take 15 mg by mouth at bedtime as needed (sleep).  01/19/15  Yes [name], MD  Multiple Vitamin (MULTIVITAMIN WITH MINERALS) TABS Take 1 tablet by mouth daily.   Yes Historical Provider, MD  Nivolumab (OPDIVO IV) Inject as directed every 2 weeks   Yes Historical Provider, MD  Nutritional Supplements (ENSURE COMPLETE PO) Take 1 Can by mouth 2 (two) times daily.   Yes Historical Provider, MD  Omega-3 Fatty Acids (FISH OIL PO) Take 1 tablet by mouth daily.   Yes Historical Provider, MD  ondansetron (ZOFRAN ODT) 8 MG disintegrating tablet Take 1 tablet (8 mg total) by mouth every 8 (eight) hours as needed for nausea. 06/01/15  Yes Varney Biles, MD  pantoprazole (PROTONIX) 40 MG tablet Take 40 mg by mouth every morning. 06/02/15  Yes Historical Provider, MD  tamsulosin (FLOMAX) 0.4 MG CAPS capsule Take 0.4 mg by mouth daily.    Yes Historical Provider, MD  thiamine (VITAMIN B-1) 100 MG tablet Take 1 tablet (100 mg total) by mouth daily. 09/11/13  Yes Ottis Stain  Kary Kos, NP  vitamin E 400 UNIT capsule Take 400 Units by mouth daily.   Yes Historical Provider, MD  sucralfate (CARAFATE) 1 G tablet Take 1 tablet (1 g total) by mouth 4 (four) times daily -  with meals and at bedtime. Patient not taking: Reported on 06/11/2015 06/01/15   Varney Biles, MD     Vital Signs: BP 113/58 mmHg  Pulse 73  Temp(Src) 98 F (36.7 C) (Oral)  Resp 18  Ht '5\' 8"'$  (1.727 m)  Wt 150 lb 9.2 oz (68.3 kg)  BMI 22.90 kg/m2  SpO2 96%  Physical Exam   Awake and Alert Abdomen Soft, only mild tenderness to palpation at the RUQ Drain site looks good, no erythema, no ecchymosis Drainage is bilious, no purulence, ~620  mls over night and ~85 mls so far today.  Imaging: Nm Hepatobiliary Liver Func  06/11/2015   CLINICAL DATA:  Right upper quadrant abdominal pain, nausea and vomiting intermittently for the past 2 weeks. Cholelithiasis and gallbladder sludge and possible gallbladder mass at ultrasound today.  EXAM: NUCLEAR MEDICINE HEPATOBILIARY IMAGING  TECHNIQUE: Sequential images of the abdomen were obtained out to 60 minutes following intravenous administration of radiopharmaceutical.  RADIOPHARMACEUTICALS:  5.1 mCi Tc-44m Choletec IV  COMPARISON:  Abdomen ultrasound obtained today and abdomen CT dated 06/09/2015.  FINDINGS: Normal visualization of the liver, bile ducts and small bowel. The gallbladder was not visualized 1 hour after injection. Therefore, the patient was given 2.8 mg of morphine and an additional 1 mCi of technetium [id number] 1 hour after injection. This resulted in faint visualization of the gallbladder.  IMPRESSION: Delayed visualization of the gallbladder, only seen after administration of 2.8 mg of morphine. No cystic duct obstruction.   Electronically Signed   By: StClaudie Revering.D.   On: 06/11/2015 17:50   UsKoreabdomen Complete  06/11/2015   CLINICAL DATA:  Abnormal gallbladder on CT  EXAM: ULTRASOUND ABDOMEN COMPLETE  COMPARISON:  CT abdomen pelvis 06/09/2015  FINDINGS: Gallbladder: Distended gallbladder containing gallstones and extensive sludge. Gallbladder wall is thickened at 5.4 mm. Negative sonographic Murphy sign.  There is focal soft tissue thickening of the lateral wall of the gallbladder measuring 14 mm in thickness raising the possibility of a mass lesion. This is difficult difficult to confirm on the CT scan.  Common bile duct: Diameter: 3.6 mm  Liver: 9 mm cyst.  No other liver mass lesion.  IVC: No abnormality visualized.  Pancreas: Not visualized  Spleen: Size and appearance within normal limits.  Right Kidney: Length: 10.7 cm. Echogenicity within normal limits. No mass or  hydronephrosis visualized.  Left Kidney: Length: 10.9 cm. Echogenicity within normal limits. No mass or hydronephrosis visualized.  Abdominal aorta: No aneurysm visualized.  Other findings: None.  IMPRESSION: Distended gallbladder containing gallstones and sludge. Gallbladder wall thickening. The patient is nontender over the gallbladder. Possible acute cholecystitis. The patient currently has a normal white blood count. There is a possible mass the lateral gallbladder which could be due to carcinoma versus sludge or inflammation.   Electronically Signed   By: ChFranchot Gallo.D.   On: 06/11/2015 12:29   Ir Perc Cholecystostomy  06/13/2015   CLINICAL DATA:  Acute cholecystitis  EXAM: PERCUTANEOUS CHOLECYSTOSTOMY TUBE PLACEMENT WITH ULTRASOUND AND FLUOROSCOPIC GUIDANCE:  FLUOROSCOPY TIME:  6 seconds, 1 mGy  TECHNIQUE: The procedure, risks (including but not limited to bleeding, infection, organ damage ), benefits, and alternatives were explained to the patient. Questions regarding the procedure were encouraged and  answered. The patient understands and consents to the procedure. Survey ultrasound of the abdomen was performed and an appropriate skin entry site was identified. Skin site was marked, prepped with Betadine, and draped in usual sterile fashion, and infiltrated locally with 1% lidocaine.  Intravenous Fentanyl and Versed were administered as conscious sedation during continuous cardiorespiratory monitoring by the radiology RN, with a total moderate sedation time of 8 minutes.  Under real-time ultrasound guidance, gallbladder was accessed using a subcostal approach with a 19 gauge needle. Ultrasound image documentation was saved. Bile returned through the hub. Needle was exchanged over a guidewire for transitional dilator which allowed placement of 035 J wire. Over this, a 10.2 French pigtail catheter was advanced and formed centrally in the gallbladder lumen. 20 mL of purulent bile were aspirated, sent for  culture. Small contrast injection confirmed appropriate position on fluoroscopy. Catheter secured externally with 0 Prolene suture and placed external drain bag. Patient tolerated the procedure well.  COMPLICATIONS: COMPLICATIONS none  IMPRESSION: 1. Technically successful percutaneous cholecystostomy tube placement with ultrasound and fluoroscopic guidance.   Electronically Signed   By: Lucrezia Europe M.D.   On: 06/13/2015 11:25    Labs:  CBC:  Recent Labs  06/10/15 1240 06/11/15 1132 06/12/15 0519 06/12/15 2305  WBC 7.5 6.3 5.5 4.3  HGB 13.3 12.7* 12.6* 12.0*  HCT 38.3* 35.7* 35.2* 33.7*  PLT 316 340 340 363    COAGS:  Recent Labs  09/01/14 1148 06/11/15 1905 06/12/15 0519 06/12/15 0545 06/12/15 1246 06/12/15 2300  INR 1.1*  --  1.26  --   --   --   APTT  --  45*  --  70* 104* 66*    BMP:  Recent Labs  06/01/15 0149 06/10/15 1241 06/11/15 1132 06/12/15 0519 06/13/15 0530  NA 128* 128* 128* 129* 128*  K 3.3* 3.3* 3.5 3.4* 3.9  CL 94*  --  88* 94* 95*  CO2 '22 27 30 24 25  '$ GLUCOSE 146* 117 108* 77 86  BUN 12 12.'9 12 12 8  '$ CALCIUM 9.3 9.2 8.7* 8.6* 8.4*  CREATININE 0.69 0.8 0.79 0.78 0.80  GFRNONAA >60  --  >60 >60 >60  GFRAA >60  --  >60 >60 >60    LIVER FUNCTION TESTS:  Recent Labs  06/10/15 1241 06/11/15 1132 06/12/15 0519 06/13/15 0530  BILITOT 0.74 1.0 1.1 0.7  AST '18 24 23 20  '$ ALT '16 17 19 '$ 16*  ALKPHOS 94 84 81 68  PROT 6.5 6.8 6.1* 5.7*  ALBUMIN 3.4* 3.7 3.4* 3.2*    Assessment and Plan:  Acute Cholecystitis S/P perc chole drain placement 06/13/15 Continue care per CCS  Signed: Ronon Ferger S Faithlynn Deeley PA-C 06/14/2015, 10:48 AM   I spent a total of 15 Minutes in face to face in clinical consultation/evaluation, greater than 50% of which was counseling/coordinating care for per chole drain.

## 2015-06-14 NOTE — Progress Notes (Signed)
[address] [place], [place] [address] Phone: [phone] FAX: [phone]   Anthony Curry [id number] Mar 01, 1941   Problem List:   Active Problems:   Hypertensive cardiovascular disease   BPH (benign prostatic hyperplasia)   GERD (gastroesophageal reflux disease)   Lung cancer   Acute cholecystitis   Atrial fibrillation       Assessment  Acute cholecystitis   Plan:  Acute cholecystitis -cholecystostomy 7/9 per IR which is to remain in place for 6-8 weeks.  -Discuss with Dr. [name] regarding the patients prognosis and determine whether he would be a surgical candidate in the future.  With gallstones, very likely yto get another attack at some point.  Patient wishes to be aggressive & do chole in future to avoid another attack.    -Clears - adv diet as tolerated -zosyn.  GNR in [place].  Switch to Augmentin at d/c.  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 S/p palliative radiation to left adrenal gland 1/16   Right breast enlargement-further work up per Dr. [name]   Atrial fibrillation-Eliquis on hold since 7/7 AM. On heparin drip. C/w home meds.  Hyponatremia-Prob fluid overload & Lasix - defer to medicine but hold off on ##IVF since lung CA pt on diuretics  Hypokalemia-replace  hypomagnesemia - Mg replacing  Hypertension-continue home meds  Dispo-continue inpatient  I updated the status of the patient to the patient and the patient's daughter.  RN Helene Kelp as well.  I made recommendations.  I answered questions.  Understanding & appreciation was expressed.   D/C patient from hospital when patient meets criteria (anticipate in 1-2 day(s)):  Tolerating oral intake well Ambulating well Adequate pain control without IV medications Urinating  Having flatus Drain care  teaching Disposition planning in place   [name], M.D., F.A.C.S. Gastrointestinal and Minimally Invasive Surgery [facility] [address] [place], [place] [address] [phone] Main / Paging   06/14/2015  Subjective:  Drain placed Wants to eat Daughter in room Bored/frustrated - wants "to get out of here" No mjr events  Objective:  Vital signs:  Filed Vitals:   06/13/15 1150 06/13/15 1400 06/13/15 2134 06/14/15 0523  BP: 133/61 119/73 119/55 113/58  Pulse: 62 62 62 73  Temp: 97.3 F (36.3 C) 97 F (36.1 C) 97.9 F (36.6 C) 98 F (36.7 C)  TempSrc: Oral Oral Oral Oral  Resp: 18 16 16 18   Height:      Weight:      SpO2: 100% 100% 99% 96%    Last BM Date: 06/12/15  Intake/Output   Yesterday:  07/09 0701 - 07/10 0700 In: 185 [P.O.:130; IV Piggyback:50] Out: 1220 [Urine:600; Drains:620] This shift:  Total I/O In: -  Out: 85 [Drains:85]  Bowel function:  Flatus: y  BM: y  Drain: Bilious - no pus  Physical Exam:  General: Pt awake/alert/oriented x4 in no acute distress Eyes: PERRL, normal EOM.  Sclera clear.  No icterus Neuro: CN II-XII intact w/o focal sensory/motor deficits. Lymph: No head/neck/groin lymphadenopathy Psych:  No delerium/psychosis/paranoia HENT: Normocephalic, Mucus membranes moist.  No thrush Neck: Supple, No tracheal deviation Chest: No chest wall pain w good excursion.  Effort fair CV:  Pulses intact.  Regular rhythm MS: Normal AROM mjr joints.  No obvious deformity Abdomen: Soft.  Nondistended.  Min tender at  RUQ only.  No evidence of peritonitis.  No incarcerated hernias. Ext:  SCDs BLE.  No mjr edema.  No cyanosis Skin: No petechiae / purpura  Results:   GALL BLADDER    Special Requests NONE   Gram Stain GRAM NEGATIVE RODS  IN BOTH AEROBIC AND ANAEROBIC BOTTLES  CRITICAL RESULT CALLED TO, READ BACK BY AND VERIFIED WITH: C BEVERLY,RN AT 0740 06/14/15 BY L BENFIELD        Culture GRAM NEGATIVE RODS   Report Status PENDING          Labs: Results for orders placed or performed during the hospital encounter of 06/11/15 (from the past 48 hour(s))  Heparin level (unfractionated)     Status: Abnormal   Collection Time: 06/12/15 12:46 PM  Result Value Ref Range   Heparin Unfractionated 1.70 (H) 0.30 - 0.70 IU/mL    Comment: RESULTS CONFIRMED BY MANUAL DILUTION        IF HEPARIN RESULTS ARE BELOW EXPECTED VALUES, AND PATIENT DOSAGE HAS BEEN CONFIRMED, SUGGEST FOLLOW UP TESTING OF ANTITHROMBIN III LEVELS.   APTT     Status: Abnormal   Collection Time: 06/12/15 12:46 PM  Result Value Ref Range   aPTT 104 (H) 24 - 37 seconds    Comment:        IF BASELINE aPTT IS ELEVATED, SUGGEST PATIENT RISK ASSESSMENT BE USED TO DETERMINE APPROPRIATE ANTICOAGULANT THERAPY. REPEATED TO VERIFY RESULT CHECKED   Magnesium     Status: Abnormal   Collection Time: 06/12/15 12:46 PM  Result Value Ref Range   Magnesium 1.5 (L) 1.7 - 2.4 mg/dL  Heparin level (unfractionated)     Status: Abnormal   Collection Time: 06/12/15 11:00 PM  Result Value Ref Range   Heparin Unfractionated 0.74 (H) 0.30 - 0.70 IU/mL    Comment:        IF HEPARIN RESULTS ARE BELOW EXPECTED VALUES, AND PATIENT DOSAGE HAS BEEN CONFIRMED, SUGGEST FOLLOW UP TESTING OF ANTITHROMBIN III LEVELS.   APTT     Status: Abnormal   Collection Time: 06/12/15 11:00 PM  Result Value Ref Range   aPTT 66 (H) 24 - 37 seconds    Comment:        IF BASELINE aPTT IS ELEVATED, SUGGEST PATIENT RISK ASSESSMENT BE USED TO DETERMINE APPROPRIATE ANTICOAGULANT THERAPY.   CBC     Status: Abnormal   Collection Time: 06/12/15 11:05 PM  Result Value Ref Range   WBC 4.3 4.0 - 10.5 K/uL   RBC 3.72 (L) 4.22 - 5.81 MIL/uL   Hemoglobin 12.0 (L) 13.0 - 17.0 g/dL   HCT 33.7 (L) 39.0 - 52.0 %   MCV 90.6 78.0 - 100.0 fL   MCH 32.3 26.0 - 34.0 pg   MCHC 35.6 30.0 - 36.0 g/dL   RDW 13.4 11.5 - 15.5 %   Platelets 363  150 - 400 K/uL  Comprehensive metabolic panel     Status: Abnormal   Collection Time: 06/13/15  5:30 AM  Result Value Ref Range   Sodium 128 (L) 135 - 145 mmol/L   Potassium 3.9 3.5 - 5.1 mmol/L   Chloride 95 (L) 101 - 111 mmol/L   CO2 25 22 - 32 mmol/L   Glucose, Bld 86 65 - 99 mg/dL   BUN 8 6 - 20 mg/dL   Creatinine, Ser 0.80 0.61 - 1.24 mg/dL   Calcium 8.4 (L) 8.9 - 10.3 mg/dL   Total Protein 5.7 (L) 6.5 - 8.1 g/dL   Albumin 3.2 (  L) 3.5 - 5.0 g/dL   AST 20 15 - 41 U/L   ALT 16 (L) 17 - 63 U/L   Alkaline Phosphatase 68 38 - 126 U/L   Total Bilirubin 0.7 0.3 - 1.2 mg/dL   GFR calc non Af Amer >60 >60 mL/min   GFR calc Af Amer >60 >60 mL/min    Comment: (NOTE) The eGFR has been calculated using the CKD EPI equation. This calculation has not been validated in all clinical situations. eGFR's persistently <60 mL/min signify possible Chronic Kidney Disease.    Anion gap 8 5 - 15  Magnesium     Status: None   Collection Time: 06/13/15  5:30 AM  Result Value Ref Range   Magnesium 1.9 1.7 - 2.4 mg/dL    Imaging / Studies: Ir Perc Cholecystostomy  06/13/2015   CLINICAL DATA:  Acute cholecystitis  EXAM: PERCUTANEOUS CHOLECYSTOSTOMY TUBE PLACEMENT WITH ULTRASOUND AND FLUOROSCOPIC GUIDANCE:  FLUOROSCOPY TIME:  6 seconds, 1 mGy  TECHNIQUE: The procedure, risks (including but not limited to bleeding, infection, organ damage ), benefits, and alternatives were explained to the patient. Questions regarding the procedure were encouraged and answered. The patient understands and consents to the procedure. Survey ultrasound of the abdomen was performed and an appropriate skin entry site was identified. Skin site was marked, prepped with Betadine, and draped in usual sterile fashion, and infiltrated locally with 1% lidocaine.  Intravenous Fentanyl and Versed were administered as conscious sedation during continuous cardiorespiratory monitoring by the radiology RN, with a total moderate sedation time of  8 minutes.  Under real-time ultrasound guidance, gallbladder was accessed using a subcostal approach with a 19 gauge needle. Ultrasound image documentation was saved. Bile returned through the hub. Needle was exchanged over a guidewire for transitional dilator which allowed placement of 035 J wire. Over this, a 10.2 French pigtail catheter was advanced and formed centrally in the gallbladder lumen. 20 mL of purulent bile were aspirated, sent for culture. Small contrast injection confirmed appropriate position on fluoroscopy. Catheter secured externally with 0 Prolene suture and placed external drain bag. Patient tolerated the procedure well.  COMPLICATIONS: COMPLICATIONS none  IMPRESSION: 1. Technically successful percutaneous cholecystostomy tube placement with ultrasound and fluoroscopic guidance.   Electronically Signed   By: Lucrezia Europe M.D.   On: 06/13/2015 11:25    Medications / Allergies: per chart  Antibiotics: Anti-infectives    Start     Dose/Rate Route Frequency Ordered Stop   06/11/15 2200  demeclocycline (DECLOMYCIN) tablet 150 mg     150 mg Oral 2 times daily 06/11/15 1459     06/11/15 1415  piperacillin-tazobactam (ZOSYN) IVPB 3.375 g     3.375 g 12.5 mL/hr over 240 Minutes Intravenous 3 times per day 06/11/15 1409          Note: Portions of this report may have been transcribed using voice recognition software. Every effort was made to ensure accuracy; however, inadvertent computerized transcription errors may be present.   Any transcriptional errors that result from this process are unintentional.     [name], M.D., F.A.C.S. Gastrointestinal and Minimally Invasive Surgery [facility] [address], [place], [place] [address] [phone] Main / Paging   06/14/2015  CARE TEAM:  PCP: [name], MD  Outpatient Care Team: Patient Care Team: [name], MD as PCP - General (Family Medicine)  Inpatient  Treatment Team: Treatment Team: Attending Provider: Nolon Nations, MD; Consulting Physician: Nolon Nations, MD; Registered Nurse:  Dala Dock, RN; Rounding Team: Garner Gavel, MD; Technician: Wilder Glade, [place]; Registered Nurse: Sharma Covert, RN; Registered Nurse: Hoyle Barr, RN; Registered Nurse: Daylene Posey, RN

## 2015-06-14 NOTE — Progress Notes (Signed)
CRITICAL VALUE ALERT  Critical value received:  Gall bladder culture-gram neg rods  Date of notification:  06/14/15  Time of notification:0745  Critical value read back: YES  Nurse who received alert:  Cindy Beverly/ Shirline Frees RN  MD notified (1st page):   Dr Leighton Ruff  Time of first page: 0750 am 06/14/15  MD notified (2nd page): Dr. Leighton Ruff  Time of second page: 0909  Responding MD:  Dr. Leighton Ruff  Time MD responded: [phone]  Reported to physician lab called with critical gallbladder culture of gram negative rods.  No orders received.  Also asked if patient diet could be advanced due to patient request.  Physician orders clear liquid diet and advance as tolerated.

## 2015-06-14 NOTE — Progress Notes (Signed)
[facility] PROGRESS NOTE  Anthony Curry JYN:[id number] DOB: 26-Dec-1940 DOA: 06/11/2015 PCP: [name], MD  Assessment/Plan: Active Problems:   Hypertensive cardiovascular disease - Patient is currently on Cardizem    BPH (benign prostatic hyperplasia) - Stable continue Flomax    GERD (gastroesophageal reflux disease) - Stable continue Protonix    Lung cancer -Managed by oncology and patient to follow-up as outpatient for routine evaluation recommendations    Acute cholecystitis - Per primary team    Atrial fibrillation - Continue rate controlling medication Cardizem -Will discontinue heparin gtt and place back on Eliquis. Will obtain pharmacy consult to assist with this. D/c Interventional Radiology and ok to proceed with this change. Will monitor for bleeding.  Code Status: full Family Communication: no family at bedside  Disposition Plan: If no bleeding after transitioning to eliquis will plan on signing off 7/11   antibiotics:  Zosyn  HPI/Subjective: No acute issues overnight reported to me.  Objective: Filed Vitals:   06/14/15 0523  BP: 113/58  Pulse: 73  Temp: 98 F (36.7 C)  Resp: 18    Intake/Output Summary (Last 24 hours) at 06/14/15 1128 Last data filed at 06/14/15 0900  Gross per 24 hour  Intake    185 ml  Output   1305 ml  Net  -1120 ml   Filed Weights   06/11/15 2300  Weight: 68.3 kg (150 lb 9.2 oz)    Exam:   General:  Pt in nad, alert and awake  Cardiovascular: no cyanosis  Respiratory: no increased wob, no wheezes, equal chest rise   Abdomen: Soft, nondistended, no guarding  Musculoskeletal: No cyanosis or clubbing    Data Reviewed: Basic Metabolic Panel:  Recent Labs Lab 06/10/15 1241 06/11/15 1132 06/12/15 0519 06/12/15 1246 06/13/15 0530  NA 128* 128* 129*  --  128*  K 3.3* 3.5 3.4*  --  3.9  CL  --  88* 94*  --  95*  CO2 '27 30 24  '$ --  25  GLUCOSE 117 108* 77  --  86  BUN 12.'9 12 12  '$ --  8   CREATININE 0.8 0.79 0.78  --  0.80  CALCIUM 9.2 8.7* 8.6*  --  8.4*  MG  --   --   --  1.5* 1.9   Liver Function Tests:  Recent Labs Lab 06/10/15 1241 06/11/15 1132 06/12/15 0519 06/13/15 0530  AST '18 24 23 20  '$ ALT '16 17 19 '$ 16*  ALKPHOS 94 84 81 68  BILITOT 0.74 1.0 1.1 0.7  PROT 6.5 6.8 6.1* 5.7*  ALBUMIN 3.4* 3.7 3.4* 3.2*    Recent Labs Lab 06/11/15 1132  LIPASE <10*   No results for input(s): AMMONIA in the last 168 hours. CBC:  Recent Labs Lab 06/10/15 1240 06/11/15 1132 06/12/15 0519 06/12/15 2305  WBC 7.5 6.3 5.5 4.3  NEUTROABS 6.0 4.2  --   --   HGB 13.3 12.7* 12.6* 12.0*  HCT 38.3* 35.7* 35.2* 33.7*  MCV 93.6 90.2 90.0 90.6  PLT 316 340 340 363   Cardiac Enzymes: No results for input(s): CKTOTAL, CKMB, CKMBINDEX, TROPONINI in the last 168 hours. BNP (last 3 results) No results for input(s): BNP in the last 8760 hours.  ProBNP (last 3 results)  Recent Labs  07/27/14 0347  PROBNP 1683.0*    CBG: No results for input(s): GLUCAP in the last 168 hours.  Recent Results (from the past 240 hour(s))  Culture, body fluid-bottle     Status: None (Preliminary result)  Collection Time: 06/13/15 10:08 AM  Result Value Ref Range Status   Specimen Description GALL BLADDER  Final   Special Requests NONE  Final   Gram Stain   Final    GRAM NEGATIVE RODS IN BOTH AEROBIC AND ANAEROBIC BOTTLES CRITICAL RESULT CALLED TO, READ BACK BY AND VERIFIED WITH: C BEVERLY,RN AT 0740 06/14/15 BY L BENFIELD    Culture GRAM NEGATIVE RODS  Final   Report Status PENDING  Incomplete  Gram stain     Status: None (Preliminary result)   Collection Time: 06/13/15 10:08 AM  Result Value Ref Range Status   Specimen Description GALL BLADDER  Final   Special Requests NONE  Final   Gram Stain RARE MONONUCLEAR CELLS NO ORGANISMS SEEN   Final   Report Status PENDING  Incomplete     Studies: Ir Perc Cholecystostomy  06/13/2015   CLINICAL DATA:  Acute cholecystitis  EXAM:  PERCUTANEOUS CHOLECYSTOSTOMY TUBE PLACEMENT WITH ULTRASOUND AND FLUOROSCOPIC GUIDANCE:  FLUOROSCOPY TIME:  6 seconds, 1 mGy  TECHNIQUE: The procedure, risks (including but not limited to bleeding, infection, organ damage ), benefits, and alternatives were explained to the patient. Questions regarding the procedure were encouraged and answered. The patient understands and consents to the procedure. Survey ultrasound of the abdomen was performed and an appropriate skin entry site was identified. Skin site was marked, prepped with Betadine, and draped in usual sterile fashion, and infiltrated locally with 1% lidocaine.  Intravenous Fentanyl and Versed were administered as conscious sedation during continuous cardiorespiratory monitoring by the radiology RN, with a total moderate sedation time of 8 minutes.  Under real-time ultrasound guidance, gallbladder was accessed using a subcostal approach with a 19 gauge needle. Ultrasound image documentation was saved. Bile returned through the hub. Needle was exchanged over a guidewire for transitional dilator which allowed placement of 035 J wire. Over this, a 10.2 French pigtail catheter was advanced and formed centrally in the gallbladder lumen. 20 mL of purulent bile were aspirated, sent for culture. Small contrast injection confirmed appropriate position on fluoroscopy. Catheter secured externally with 0 Prolene suture and placed external drain bag. Patient tolerated the procedure well.  COMPLICATIONS: COMPLICATIONS none  IMPRESSION: 1. Technically successful percutaneous cholecystostomy tube placement with ultrasound and fluoroscopic guidance.   Electronically Signed   By: Lucrezia Europe M.D.   On: 06/13/2015 11:25    Scheduled Meds: . acidophilus  1 capsule Oral Daily  . demeclocycline  150 mg Oral BID  . diltiazem  180 mg Oral Q1200  . feeding supplement (ENSURE ENLIVE)  237 mL Oral BID BM  . furosemide  40 mg Oral Daily  . lip balm  1 application Topical BID  .  magnesium oxide  400 mg Oral Daily  . multivitamin with minerals  1 tablet Oral Daily  . pantoprazole  40 mg Oral q morning - 10a  . piperacillin-tazobactam (ZOSYN)  IV  3.375 g Intravenous 3 times per day  . potassium chloride SA  20 mEq Oral Daily  . sodium chloride  3 mL Intravenous Q12H  . sucralfate  1 g Oral TID WC & HS  . tamsulosin  0.4 mg Oral Daily  . thiamine  100 mg Oral Daily   Continuous Infusions:      Time spent: > 10 minutes    Velvet Bathe  [facility] Pager [phone] If 7PM-7AM, please contact night-coverage at [email or web address], password [facility] 06/14/2015, 11:28 AM  LOS: 3 days

## 2015-06-14 NOTE — Progress Notes (Signed)
[place] for Apixaban Indication: Atrial fibrillation  Allergies  Allergen Reactions  . Aspirin Other (See Comments)    Pt can not have Asprin due to medical conditions according to spouse    Patient Measurements: Height: '5\' 8"'$  (172.7 cm) Weight: 150 lb 9.2 oz (68.3 kg) IBW/kg (Calculated) : 68.4  Vital Signs: Temp: 98 F (36.7 C) (07/10 0523) Temp Source: Oral (07/10 0523) BP: 113/58 mmHg (07/10 0523) Pulse Rate: 73 (07/10 0523)  Labs:  Recent Labs  06/12/15 0519 06/12/15 0529 06/12/15 0545 06/12/15 1246 06/12/15 2300 06/12/15 2305 06/13/15 0530  HGB 12.6*  --   --   --   --  12.0*  --   HCT 35.2*  --   --   --   --  33.7*  --   PLT 340  --   --   --   --  363  --   APTT  --   --  70* 104* 66*  --   --   LABPROT 15.9*  --   --   --   --   --   --   INR 1.26  --   --   --   --   --   --   HEPARINUNFRC  --  2.06*  --  1.70* 0.74*  --   --   CREATININE 0.78  --   --   --   --   --  0.80    Estimated Creatinine Clearance: 77.1 mL/min (by C-G formula based on Cr of 0.8).   Medical History: Past Medical History  Diagnosis Date  . Hypertension   . GERD (gastroesophageal reflux disease)   . Alcohol abuse   . 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 Low sodium   . Persistent atrial fibrillation 04/11/14    chads2vasc score is 2  . Hypotension   . Atrial fibrillation with RVR   . HCAP (healthcare-associated pneumonia)   . Adrenal hemorrhage   . BPH (benign prostatic hyperplasia)   . Hyponatremia   . Anemia of chronic disease   . Fall   . Alcohol intoxication   . Pulmonary nodules   . Adrenal mass   . Atrial flutter by electrocardiogram   . 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 Alcoholism in remission   . Radiation 12/02/14-12/16/14    Left adrenal gland 30 Gy in 10 fractions  . Cancer     small cell/lung  . 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 Location is RUQ/right flank.  CT of abdomen and pelvis on 7/5 which suggested acute cholecystitis. Apixaban initially held on admission, as plan for percutaneous cholecystostomy in IR 7/9, and pharmacy consulted to start heparin infusion.  Patient underwent procedure yesterday, and pharmacy now consulted to resume Apixaban.  Today, 06/14/2015:  CBC stable (7/8).  No bleeding/complications reported.   Renal function appears WNL and stable. SCr 0.8 on 7/9.  Weight: 68.3 kg  Goal of Therapy:  Appropriate dosing of Apixaban per indication Stroke prevention Absence of bleeding Monitor platelets by anticoagulation protocol: yes   Plan:   Resume Apixaban 5 mg PO BID.  Monitor renal function, CBC, and for s/s of bleeding.    [place]  Scherrie November, PharmD, BCPS Pager: [phone] 06/14/2015 12:42 PM

## 2015-06-15 LAB — GRAM STAIN

## 2015-06-15 LAB — CBC
HEMATOCRIT: 38.5 % — AB (ref 39.0–52.0)
Hemoglobin: 13.5 g/dL (ref 13.0–17.0)
MCH: 32.1 pg (ref 26.0–34.0)
MCHC: 35.1 g/dL (ref 30.0–36.0)
MCV: 91.4 fL (ref 78.0–100.0)
Platelets: 402 10*3/uL — ABNORMAL HIGH (ref 150–400)
RBC: 4.21 MIL/uL — ABNORMAL LOW (ref 4.22–5.81)
RDW: 13.6 % (ref 11.5–15.5)
WBC: 6.4 10*3/uL (ref 4.0–10.5)

## 2015-06-15 LAB — BASIC METABOLIC PANEL
ANION GAP: 8 (ref 5–15)
BUN: 10 mg/dL (ref 6–20)
CALCIUM: 8.9 mg/dL (ref 8.9–10.3)
CO2: 29 mmol/L (ref 22–32)
CREATININE: 0.95 mg/dL (ref 0.61–1.24)
Chloride: 92 mmol/L — ABNORMAL LOW (ref 101–111)
GFR calc Af Amer: >60 mL/min (ref >60)
GLUCOSE: 101 mg/dL — AB (ref 65–99)
POTASSIUM: 4.1 mmol/L (ref 3.5–5.1)
SODIUM: 129 mmol/L — AB (ref 135–145)

## 2015-06-15 MED ORDER — AMOXICILLIN-POT CLAVULANATE 875-125 MG PO TABS: 1.0000 | ORAL_TABLET | Freq: Two times a day (BID) | ORAL | Status: DC

## 2015-06-15 MED ORDER — AMOXICILLIN-POT CLAVULANATE 875-125 MG PO TABS
1.0000 | ORAL_TABLET | Freq: Two times a day (BID) | ORAL | Status: DC
  Administered 2015-06-15: 1 via ORAL
  Filled 2015-06-15 (×2): qty 1

## 2015-06-15 MED ORDER — SACCHAROMYCES BOULARDII 250 MG PO CAPS: 250.0000 mg | ORAL_CAPSULE | Freq: Two times a day (BID) | ORAL | Status: DC

## 2015-06-15 MED ORDER — HYDROCODONE-ACETAMINOPHEN 5-325 MG PO TABS: 1.0000 | ORAL_TABLET | ORAL | Status: DC | PRN

## 2015-06-15 MED ORDER — SACCHAROMYCES BOULARDII 250 MG PO CAPS
250.0000 mg | ORAL_CAPSULE | Freq: Two times a day (BID) | ORAL | Status: DC
  Administered 2015-06-15: 250 mg via ORAL
  Filled 2015-06-15 (×2): qty 1

## 2015-06-15 NOTE — Care Management Note (Signed)
Case Management Note  Patient Details  Name: Joshawa Dubin MRN: [id number] Date of Birth: 1941/10/23  Subjective/Objective:      [facility] for [facility]. TC Kristen rep aware of d/c & [facility] orders.              Action/Plan:d/c home w/[facility] HHRN.No further d/c needs.   Expected Discharge Date:   (UNKNOWN)               Expected Discharge Plan:  [place]  In-House Referral:     Discharge planning Services  CM Consult  Post Acute Care Choice:    Choice offered to:  Patient  DME Arranged:    DME Agency:     HH Arranged:  RN [place] Agency:  [place]  Status of Service:  Completed, signed off  Medicare Important Message Given:  Yes-second notification given Date Medicare IM Given:    Medicare IM give by:    Date Additional Medicare IM Given:    Additional Medicare Important Message give by:     If discussed at [place] of Stay Meetings, dates discussed:    Additional Comments:  Dessa Phi, RN 06/15/2015, 2:40 PM

## 2015-06-15 NOTE — Discharge Summary (Signed)
Physician Discharge Summary  Gerber Penza IHK:[id number] DOB: [birth year] DOA: 06/11/2015  PCP: [name], MD  Consultation: internal medicine    Interventional radiology    Admit date: 06/11/2015 Discharge date: 06/15/2015  Recommendations for Outpatient Follow-up:   Follow-up Information    Follow up with [name], MD. Go on 07/13/2015.   Specialty:  General Surgery   Why:  arrive by [id number] for a 0845AM drain check with your surgeon.   Contact information:   [address] [place] [address] [phone]      Discharge Diagnoses:  1. Acute cholecystitis 2. 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 Right breast enlargement 4. Atrial fibrillation 5. Hyponatremia 6. Hypomagnesemia  7. Hypokalemia 8. 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 Location is RUQ/right flank. Onset was gradual. Coarse in unchanged. Moderate in severity. Time pattern is intermittent. Exacerbated by position or taking a deep breath. Alleviated by pain medication only to return without it. No worsening symptoms with PO intake, however, the patients appetite has been poor. His symptoms are associated with nausea and vomiting and fevers, however, none since initial onset of symptoms 2 weeks ago. He reports constipation. Denies diarrhea, melena or hematochezia.   He had a CT of abdomen and pelvis on 7/5 which suggested acute cholecystitis. He had a  abdominal US today which confirmed distended gallbladder, gallstones and sludge. Also a possible mass at lateral gallbladder, but after review with radiologist and lack of this finding on the CT scan it was thought more to be adherent sludge.  LFTs are normal. Afebrile. Normal lipase and normal white count. We have been asked to evaluate the patient for cholecystitis management.   Hospital Course:  Given medical problems we opted for cholecystostomy tube which she had placed.  Diet was advanced.  He was kept on Zosyn and transitioned to Augmentin.  Cultures yielded gram negative rods, but no final sensitivities.  Electrolytes were replenished and remains stable.  Medical issues remained stable and were managed by medicine.  Eliquis was resumed.  On HD#4 the patient was felt stable for discharge home.  I scheduled him a follow up with Dr. Dalbert Batman.  He was instructed on drain care and home health was arranged for flushes.  Dr. Dalbert Batman to determine if interval cholecystectomy is appropriate after discussion with Dr. [name].  He was encouraged to call with questions or concerns.    Discharge Instructions     Medication List    TAKE these medications        Acidophilus Caps capsule  Take 1 capsule by mouth daily.     ALPRAZolam 0.25 MG tablet  Commonly known as:  XANAX  Take 0.25 mg by mouth 3 (three) times daily as needed for anxiety or sleep.     amoxicillin-clavulanate 875-125 MG per tablet  Commonly known as:  AUGMENTIN  Take 1 tablet by mouth every 12 (twelve) hours.  apixaban 5 MG Tabs tablet  Commonly known as:  ELIQUIS  Take 1 tablet (5 mg total) by mouth 2 (two) times daily.     Biotin 7500 MCG Tabs  Take 1 tablet by mouth daily.     calcium carbonate 600 MG Tabs tablet  Commonly known as:  OS-CAL  Take 600 mg by mouth daily with breakfast.     demeclocycline 150 MG tablet  Commonly known as:  DECLOMYCIN  Take 150 mg by mouth 2 (two) times daily.     diltiazem 180  MG 24 hr capsule  Commonly known as:  CARDIZEM CD  Take 1 capsule (180 mg total) by mouth daily at 12 noon.     ENSURE COMPLETE PO  Take 1 Can by mouth 2 (two) times daily.     FISH OIL PO  Take 1 tablet by mouth daily.     furosemide 40 MG tablet  Commonly known as:  LASIX  Take 1 tablet (40 mg total) by mouth daily.     HYDROcodone-acetaminophen 5-325 MG per tablet  Commonly known as:  NORCO/VICODIN  Take 1-2 tablets by mouth every 4 (four) hours as needed for moderate pain.     KLOR-CON M20 20 MEQ tablet  Generic drug:  potassium chloride SA  Take 20 mEq by mouth daily.     MAGNESIUM PO  Take 400 mg by mouth daily.     metoprolol tartrate 25 MG tablet  Commonly known as:  LOPRESSOR  Take 0.5 tablets (12.5 mg total) by mouth 2 (two) times daily.     mirtazapine 15 MG tablet  Commonly known as:  REMERON  Take 1 tablet (15 mg total) by mouth at bedtime.     multivitamin with minerals Tabs tablet  Take 1 tablet by mouth daily.     ondansetron 8 MG disintegrating tablet  Commonly known as:  ZOFRAN ODT  Take 1 tablet (8 mg total) by mouth every 8 (eight) hours as needed for nausea.     OPDIVO IV  Inject as directed every 2 weeks     pantoprazole 40 MG tablet  Commonly known as:  PROTONIX  Take 40 mg by mouth every morning.     saccharomyces boulardii 250 MG capsule  Commonly known as:  FLORASTOR  Take 1 capsule (250 mg total) by mouth 2 (two) times daily.     sucralfate 1 G tablet  Commonly known as:  CARAFATE  Take 1 tablet (1 g total) by mouth 4 (four) times daily -  with meals and at bedtime.     SUPER B COMPLEX PO  Take 1 each by mouth daily.     tamsulosin 0.4 MG Caps capsule  Commonly known as:  FLOMAX  Take 0.4 mg by mouth daily.     thiamine 100 MG tablet  Commonly known as:  VITAMIN B-1  Take 1 tablet (100 mg total) by mouth daily.     VITAMIN C PO  Take 1,000 mg by mouth daily.     vitamin E 400 UNIT capsule  Take 400 Units by mouth daily.            Follow-up Information    Follow up with [name], MD. Go on 07/13/2015.   Specialty:  General Surgery   Why:  arrive by [id number] for a 0845AM drain check with your surgeon.   Contact information:   [address] [place] [address] [phone]  The results of significant diagnostics from this hospitalization (including imaging, microbiology, ancillary and laboratory) are listed below for reference.    Significant Diagnostic Studies: Dg Chest 2 View  06/01/2015   CLINICAL DATA:  Atrial fibrillation.  Hypotension.  EXAM: CHEST  2 VIEW  COMPARISON:  04/10/2015  FINDINGS: There is moderate hyperinflation. There are mild linear opacities bilaterally which are not significantly changed and this may represent scarring. There is no confluent airspace opacity. There is no effusion. Heart size is normal. Pulmonary vasculature is normal.  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  IMPRESSION: Bilateral linear central and basilar opacities, likely scarring. Multiple ununited right rib fractures. No acute cardiopulmonary findings.   Electronically Signed   By: Andreas Newport M.D.   On: 06/01/2015 02:55   Ct Chest W Contrast  06/09/2015   CLINICAL DATA:  Subsequent evaluation of the a 74 year old male with history of lung cancer diagnosed in November 2015. Right-sided chest pain, and pain during deep inspiration for 1 week. Chemotherapy and radiation therapy now complete. Oral chemotherapy ongoing. Prior smoker.  EXAM: CT CHEST, ABDOMEN, AND PELVIS WITH CONTRAST  TECHNIQUE: Multidetector CT imaging of the chest, abdomen and pelvis was performed following the standard protocol during bolus administration of intravenous contrast.  CONTRAST:  147m OMNIPAQUE IOHEXOL 300 MG/ML  SOLN  COMPARISON:  Chest CT 06/01/2015. CT of the chest, abdomen and pelvis  04/10/2015.  FINDINGS: CT CHEST FINDINGS  Mediastinum/Lymph Nodes: Heart size is normal. Small amount of anterior pericardial fluid and/or thickening, similar to prior examinations, and the unlikely to be of any hemodynamic significance at this time. No associated pericardial calcification. There is atherosclerosis of the thoracic aorta, the great vessels of the mediastinum and the coronary arteries, including calcified atherosclerotic plaque in the left main, left anterior descending and right coronary arteries. No pathologically enlarged mediastinal or hilar lymph nodes. Esophagus is unremarkable in appearance. No axillary lymphadenopathy.  Lungs/Pleura: Previously noted nodule in the medial aspect of the apex of the left upper lobe (image 9 of series 4) is stable to slightly decreased in size compared to the prior examination, currently measuring 13 x 7 mm. The other previously described 8 mm spiculated nodule in the left upper lobe is now a very ill-defined area of ground-glass attenuation (image 19 of series 4). Several other scattered areas of ground-glass attenuation are also noted in the upper lobes of the lungs bilaterally, which appear similar to the prior examination. Linear scarring in the left lower lobe is unchanged. New linear opacities and architectural distortion in the right lower lobe may reflect new areas of developing scarring, or may simply reflect subsegmental atelectasis. Mild diffuse bronchial wall thickening with mild centrilobular and paraseptal emphysema. No acute consolidative airspace disease. No pleural effusions.  Musculoskeletal/Soft Tissues: Asymmetric right-sided gynecomastia again noted, increased in size measuring up to 3.2 x 1.3 cm on today's examination (previously 2.5 x 1.2 cm on prior study 11/10/2014), and completely new compared to remote prior PET-CT 08/27/2013. Multiple old healed right-sided rib fractures. Status post left shoulder hemiarthroplasty. Chronic posttraumatic  degenerative changes in the right shoulder redemonstrated. There are no aggressive appearing lytic or blastic lesions noted in the visualized portions of the skeleton.  CT ABDOMEN AND PELVIS FINDINGS  Hepatobiliary: Several tiny sub cm low-attenuation liver lesions and sub cm hypervascular lesions are unchanged in size, number and distribution in the liver, likely to reflect a  combination of small cysts and flash fill cavernous hemangiomas. No new aggressive appearing liver lesions are noted. No intra or extrahepatic biliary ductal dilatation. However, there does appear to be a 4 mm partially calcified stone in the distal common bile duct (image 71 of series 2). There are several partially calcified gallstones lying dependently in the gallbladder, and the gallbladder now appears moderately distended, with a thickened wall which measures up to 9 mm, with small amount of pericholecystic fluid and extensive pericholecystic stranding, strongly suggestive of acute cholecystitis at this time.  Pancreas: No pancreatic mass. No pancreatic ductal dilatation. No pancreatic or peripancreatic fluid or inflammatory changes.  Spleen: Unremarkable.  Adrenals/Urinary Tract: Bilateral perinephric stranding (nonspecific). Multifocal cortical thinning in the kidneys bilaterally, presumably chronic post infectious or inflammatory scarring. Sub cm low-attenuation lesions in the kidneys bilaterally are too small to definitively characterize, but are similar to prior studies, presumably small cysts. Mild bilateral adrenal nodularity, now difficult to accurately measure, but smaller than the prior examinations. No hydroureteronephrosis. Urinary bladder is remarkable for multiple small bladder wall diverticulae.  Stomach/Bowel: Normal appearance of the stomach. No pathologic dilatation of small bowel or colon. A few scattered colonic diverticulae are noted, without surrounding inflammatory changes to suggest an acute diverticulitis at this  time.  Vascular/Lymphatic: Extensive atherosclerosis throughout the abdominal and pelvic vasculature, without evidence of aneurysm or dissection.  Reproductive: Prostate gland and seminal vesicles are unremarkable in appearance.  Other: Trace volume of ascites.  No pneumoperitoneum.  Musculoskeletal: There are no aggressive appearing lytic or blastic lesions noted in the visualized portions of the skeleton. Extensive multilevel degenerative disc disease and spondylosis throughout the lumbar spine.  IMPRESSION: 1. Findings are highly concerning for acute cholecystitis. This could be confirmed with right upper quadrant ultrasound if clinically appropriate. Surgical consultation is also suggested. 2. Generally stable (or slightly improved) appearance of multiple pulmonary nodules (most of which are ground-glass attenuation) in the lungs bilaterally, as above. Continued attention on followup studies is recommended to ensure continued stability. 3. Slow progressive enlargement of asymmetric gynecomastia. This could represent a soft tissue metastasis, or primary male breast cancer, and further evaluation with breast ultrasound and/or mammography is suggested if clinically appropriate. 4. Additional incidental findings, as above, generally similar prior examinations. These results will be called to the ordering clinician or representative by the Radiologist Assistant, and communication documented in the PACS or zVision Dashboard.   Electronically Signed   By: Vinnie Langton M.D.   On: 06/09/2015 15:45   Ct Angio Chest Pe W/cm &/or Wo Cm  06/01/2015   CLINICAL DATA:  Midsternal chest pain, onset yesterday.  EXAM: CT ANGIOGRAPHY CHEST WITH CONTRAST  TECHNIQUE: Multidetector CT imaging of the chest was performed using the standard protocol during bolus administration of intravenous contrast. Multiplanar CT image reconstructions and MIPs were obtained to evaluate the vascular anatomy.  CONTRAST:  148m OMNIPAQUE IOHEXOL  350 MG/ML SOLN  COMPARISON:  04/10/2015  FINDINGS: Cardiovascular: There is good opacification of the pulmonary arteries. There is no pulmonary embolism. The thoracic aorta is normal in caliber and intact.  Lungs: Unchanged 5 x 14 mm medial left apical spiculated nodule. Unchanged smaller nodules elsewhere in the left upper lobe. New or worsened ground-glass opacities in both upper lobes. Underlying emphysematous changes.  Central airways: Patent. Small amount of retained secretions in the trachea.  Effusions: None  Lymphadenopathy: None  Esophagus: Unremarkable  Upper abdomen: Unremarkable  Musculoskeletal: Asymmetric right gynecomastia, unchanged  Review of the MIP images confirms  the above findings.  IMPRESSION: 1. Negative for pulmonary embolism 2. New or worsened mild ground-glass opacities in both upper lobes, superimposed on extensive emphysematous changes. This could represent infectious infiltrate. Alveolar edema is a possibility. Neoplastic ground-glass opacities are less likely. Unchanged spiculated medial left apical nodule as well as smaller upper lobe nodules. 3. Small amount of retained secretions in the trachea.   Electronically Signed   By: Andreas Newport M.D.   On: 06/01/2015 04:35   Nm Hepatobiliary Liver Func  06/11/2015   CLINICAL DATA:  Right upper quadrant abdominal pain, nausea and vomiting intermittently for the past 2 weeks. Cholelithiasis and gallbladder sludge and possible gallbladder mass at ultrasound today.  EXAM: NUCLEAR MEDICINE HEPATOBILIARY IMAGING  TECHNIQUE: Sequential images of the abdomen were obtained out to 60 minutes following intravenous administration of radiopharmaceutical.  RADIOPHARMACEUTICALS:  5.1 mCi Tc-79m Choletec IV  COMPARISON:  Abdomen ultrasound obtained today and abdomen CT dated 06/09/2015.  FINDINGS: Normal visualization of the liver, bile ducts and small bowel. The gallbladder was not visualized 1 hour after injection. Therefore, the patient was given  2.8 mg of morphine and an additional 1 mCi of technetium [id number] 1 hour after injection. This resulted in faint visualization of the gallbladder.  IMPRESSION: Delayed visualization of the gallbladder, only seen after administration of 2.8 mg of morphine. No cystic duct obstruction.   Electronically Signed   By: StClaudie Revering.D.   On: 06/11/2015 17:50   UsKoreabdomen Complete  06/11/2015   CLINICAL DATA:  Abnormal gallbladder on CT  EXAM: ULTRASOUND ABDOMEN COMPLETE  COMPARISON:  CT abdomen pelvis 06/09/2015  FINDINGS: Gallbladder: Distended gallbladder containing gallstones and extensive sludge. Gallbladder wall is thickened at 5.4 mm. Negative sonographic Murphy sign.  There is focal soft tissue thickening of the lateral wall of the gallbladder measuring 14 mm in thickness raising the possibility of a mass lesion. This is difficult difficult to confirm on the CT scan.  Common bile duct: Diameter: 3.6 mm  Liver: 9 mm cyst.  No other liver mass lesion.  IVC: No abnormality visualized.  Pancreas: Not visualized  Spleen: Size and appearance within normal limits.  Right Kidney: Length: 10.7 cm. Echogenicity within normal limits. No mass or hydronephrosis visualized.  Left Kidney: Length: 10.9 cm. Echogenicity within normal limits. No mass or hydronephrosis visualized.  Abdominal aorta: No aneurysm visualized.  Other findings: None.  IMPRESSION: Distended gallbladder containing gallstones and sludge. Gallbladder wall thickening. The patient is nontender over the gallbladder. Possible acute cholecystitis. The patient currently has a normal white blood count. There is a possible mass the lateral gallbladder which could be due to carcinoma versus sludge or inflammation.   Electronically Signed   By: ChFranchot Gallo.D.   On: 06/11/2015 12:29   Ct Abdomen Pelvis W Contrast  06/09/2015   CLINICAL DATA:  Subsequent evaluation of the a [age]ear old male with history of lung cancer diagnosed in November 2015. Right-sided  chest pain, and pain during deep inspiration for 1 week. Chemotherapy and radiation therapy now complete. Oral chemotherapy ongoing. Prior smoker.  EXAM: CT CHEST, ABDOMEN, AND PELVIS WITH CONTRAST  TECHNIQUE: Multidetector CT imaging of the chest, abdomen and pelvis was performed following the standard protocol during bolus administration of intravenous contrast.  CONTRAST:  1009mMNIPAQUE IOHEXOL 300 MG/ML  SOLN  COMPARISON:  Chest CT 06/01/2015. CT of the chest, abdomen and pelvis 04/10/2015.  FINDINGS: CT CHEST FINDINGS  Mediastinum/Lymph Nodes: Heart size is normal. Small amount of anterior  pericardial fluid and/or thickening, similar to prior examinations, and the unlikely to be of any hemodynamic significance at this time. No associated pericardial calcification. There is atherosclerosis of the thoracic aorta, the great vessels of the mediastinum and the coronary arteries, including calcified atherosclerotic plaque in the left main, left anterior descending and right coronary arteries. No pathologically enlarged mediastinal or hilar lymph nodes. Esophagus is unremarkable in appearance. No axillary lymphadenopathy.  Lungs/Pleura: Previously noted nodule in the medial aspect of the apex of the left upper lobe (image 9 of series 4) is stable to slightly decreased in size compared to the prior examination, currently measuring 13 x 7 mm. The other previously described 8 mm spiculated nodule in the left upper lobe is now a very ill-defined area of ground-glass attenuation (image 19 of series 4). Several other scattered areas of ground-glass attenuation are also noted in the upper lobes of the lungs bilaterally, which appear similar to the prior examination. Linear scarring in the left lower lobe is unchanged. New linear opacities and architectural distortion in the right lower lobe may reflect new areas of developing scarring, or may simply reflect subsegmental atelectasis. Mild diffuse bronchial wall thickening  with mild centrilobular and paraseptal emphysema. No acute consolidative airspace disease. No pleural effusions.  Musculoskeletal/Soft Tissues: Asymmetric right-sided gynecomastia again noted, increased in size measuring up to 3.2 x 1.3 cm on today's examination (previously 2.5 x 1.2 cm on prior study 11/10/2014), and completely new compared to remote prior PET-CT 08/27/2013. Multiple old healed right-sided rib fractures. Status post left shoulder hemiarthroplasty. Chronic posttraumatic degenerative changes in the right shoulder redemonstrated. There are no aggressive appearing lytic or blastic lesions noted in the visualized portions of the skeleton.  CT ABDOMEN AND PELVIS FINDINGS  Hepatobiliary: Several tiny sub cm low-attenuation liver lesions and sub cm hypervascular lesions are unchanged in size, number and distribution in the liver, likely to reflect a combination of small cysts and flash fill cavernous hemangiomas. No new aggressive appearing liver lesions are noted. No intra or extrahepatic biliary ductal dilatation. However, there does appear to be a 4 mm partially calcified stone in the distal common bile duct (image 71 of series 2). There are several partially calcified gallstones lying dependently in the gallbladder, and the gallbladder now appears moderately distended, with a thickened wall which measures up to 9 mm, with small amount of pericholecystic fluid and extensive pericholecystic stranding, strongly suggestive of acute cholecystitis at this time.  Pancreas: No pancreatic mass. No pancreatic ductal dilatation. No pancreatic or peripancreatic fluid or inflammatory changes.  Spleen: Unremarkable.  Adrenals/Urinary Tract: Bilateral perinephric stranding (nonspecific). Multifocal cortical thinning in the kidneys bilaterally, presumably chronic post infectious or inflammatory scarring. Sub cm low-attenuation lesions in the kidneys bilaterally are too small to definitively characterize, but are  similar to prior studies, presumably small cysts. Mild bilateral adrenal nodularity, now difficult to accurately measure, but smaller than the prior examinations. No hydroureteronephrosis. Urinary bladder is remarkable for multiple small bladder wall diverticulae.  Stomach/Bowel: Normal appearance of the stomach. No pathologic dilatation of small bowel or colon. A few scattered colonic diverticulae are noted, without surrounding inflammatory changes to suggest an acute diverticulitis at this time.  Vascular/Lymphatic: Extensive atherosclerosis throughout the abdominal and pelvic vasculature, without evidence of aneurysm or dissection.  Reproductive: Prostate gland and seminal vesicles are unremarkable in appearance.  Other: Trace volume of ascites.  No pneumoperitoneum.  Musculoskeletal: There are no aggressive appearing lytic or blastic lesions noted in the visualized portions of the  skeleton. Extensive multilevel degenerative disc disease and spondylosis throughout the lumbar spine.  IMPRESSION: 1. Findings are highly concerning for acute cholecystitis. This could be confirmed with right upper quadrant ultrasound if clinically appropriate. Surgical consultation is also suggested. 2. Generally stable (or slightly improved) appearance of multiple pulmonary nodules (most of which are ground-glass attenuation) in the lungs bilaterally, as above. Continued attention on followup studies is recommended to ensure continued stability. 3. Slow progressive enlargement of asymmetric gynecomastia. This could represent a soft tissue metastasis, or primary male breast cancer, and further evaluation with breast ultrasound and/or mammography is suggested if clinically appropriate. 4. Additional incidental findings, as above, generally similar prior examinations. These results will be called to the ordering clinician or representative by the Radiologist Assistant, and communication documented in the PACS or zVision Dashboard.    Electronically Signed   By: Vinnie Langton M.D.   On: 06/09/2015 15:45   Ir Perc Cholecystostomy  06/13/2015   CLINICAL DATA:  Acute cholecystitis  EXAM: PERCUTANEOUS CHOLECYSTOSTOMY TUBE PLACEMENT WITH ULTRASOUND AND FLUOROSCOPIC GUIDANCE:  FLUOROSCOPY TIME:  6 seconds, 1 mGy  TECHNIQUE: The procedure, risks (including but not limited to bleeding, infection, organ damage ), benefits, and alternatives were explained to the patient. Questions regarding the procedure were encouraged and answered. The patient understands and consents to the procedure. Survey ultrasound of the abdomen was performed and an appropriate skin entry site was identified. Skin site was marked, prepped with Betadine, and draped in usual sterile fashion, and infiltrated locally with 1% lidocaine.  Intravenous Fentanyl and Versed were administered as conscious sedation during continuous cardiorespiratory monitoring by the radiology RN, with a total moderate sedation time of 8 minutes.  Under real-time ultrasound guidance, gallbladder was accessed using a subcostal approach with a 19 gauge needle. Ultrasound image documentation was saved. Bile returned through the hub. Needle was exchanged over a guidewire for transitional dilator which allowed placement of 035 J wire. Over this, a 10.2 French pigtail catheter was advanced and formed centrally in the gallbladder lumen. 20 mL of purulent bile were aspirated, sent for culture. Small contrast injection confirmed appropriate position on fluoroscopy. Catheter secured externally with 0 Prolene suture and placed external drain bag. Patient tolerated the procedure well.  COMPLICATIONS: COMPLICATIONS none  IMPRESSION: 1. Technically successful percutaneous cholecystostomy tube placement with ultrasound and fluoroscopic guidance.   Electronically Signed   By: Lucrezia Europe M.D.   On: 06/13/2015 11:25    Microbiology: Recent Results (from the past 240 hour(s))  Culture, body fluid-bottle     Status:  None (Preliminary result)   Collection Time: 06/13/15 10:08 AM  Result Value Ref Range Status   Specimen Description GALL BLADDER  Final   Special Requests NONE  Final   Gram Stain   Final    GRAM NEGATIVE RODS IN BOTH AEROBIC AND ANAEROBIC BOTTLES CRITICAL RESULT CALLED TO, READ BACK BY AND VERIFIED WITH: C BEVERLY,RN AT 0740 06/14/15 BY L BENFIELD    Culture GRAM NEGATIVE RODS  Final   Report Status PENDING  Incomplete  Gram stain     Status: None (Preliminary result)   Collection Time: 06/13/15 10:08 AM  Result Value Ref Range Status   Specimen Description GALL BLADDER  Final   Special Requests NONE  Final   Gram Stain RARE MONONUCLEAR CELLS NO ORGANISMS SEEN   Final   Report Status PENDING  Incomplete     Labs: Basic Metabolic Panel:  Recent Labs Lab 06/10/15 1241 06/11/15 1132 06/12/15 0519 06/12/15  1246 06/13/15 0530 06/15/15 0519  NA 128* 128* 129*  --  128* 129*  K 3.3* 3.5 3.4*  --  3.9 4.1  CL  --  88* 94*  --  95* 92*  CO2 '27 30 24  '$ --  25 29  GLUCOSE 117 108* 77  --  86 101*  BUN 12.'9 12 12  '$ --  8 10  CREATININE 0.8 0.79 0.78  --  0.80 0.95  CALCIUM 9.2 8.7* 8.6*  --  8.4* 8.9  MG  --   --   --  1.5* 1.9  --    Liver Function Tests:  Recent Labs Lab 06/10/15 1241 06/11/15 1132 06/12/15 0519 06/13/15 0530  AST '18 24 23 20  '$ ALT '16 17 19 '$ 16*  ALKPHOS 94 84 81 68  BILITOT 0.74 1.0 1.1 0.7  PROT 6.5 6.8 6.1* 5.7*  ALBUMIN 3.4* 3.7 3.4* 3.2*    Recent Labs Lab 06/11/15 1132  LIPASE <10*   No results for input(s): AMMONIA in the last 168 hours. CBC:  Recent Labs Lab 06/10/15 1240 06/11/15 1132 06/12/15 0519 06/12/15 2305 06/15/15 0519  WBC 7.5 6.3 5.5 4.3 6.4  NEUTROABS 6.0 4.2  --   --   --   HGB 13.3 12.7* 12.6* 12.0* 13.5  HCT 38.3* 35.7* 35.2* 33.7* 38.5*  MCV 93.6 90.2 90.0 90.6 91.4  PLT 316 340 340 363 402*   Cardiac Enzymes: No results for input(s): CKTOTAL, CKMB, CKMBINDEX, TROPONINI in the last 168 hours. BNP: BNP  (last 3 results) No results for input(s): BNP in the last 8760 hours.  ProBNP (last 3 results)  Recent Labs  07/27/14 0347  PROBNP 1683.0*    CBG: No results for input(s): GLUCAP in the last 168 hours.  Active Problems:   Hypertensive cardiovascular disease   BPH (benign prostatic hyperplasia)   GERD (gastroesophageal reflux disease)   Lung cancer   Acute cholecystitis   Atrial fibrillation   Time coordinating discharge: <30 mins  Signed:  Aveleen Nevers, ANP-BC

## 2015-06-15 NOTE — Progress Notes (Signed)
I attempted to teach pt and wife drain care, and how to record output, but pt interrupted me and said that he and his wife already know how to take care of the drain. Wife agreed.

## 2015-06-15 NOTE — Progress Notes (Signed)
[facility] PROGRESS NOTE  Anthony Curry JQB:[id number] DOB: 1941/02/03 DOA: 06/11/2015 PCP: [name], MD  Assessment/Plan: Active Problems:   Hypertensive cardiovascular disease - Patient is currently on Cardizem should continue on d/c    BPH (benign prostatic hyperplasia) - Stable continue Flomax    GERD (gastroesophageal reflux disease) - Stable continue Protonix    Lung cancer -Managed by oncology and patient to follow-up as outpatient for routine evaluation recommendations    Acute cholecystitis - Per primary team    Atrial fibrillation - Continue rate controlling medication Cardizem -Pt back on eliquis   Code Status: full Family Communication: no family at bedside  Disposition Plan: Per primary team most likely d/c later today.  Signing off    antibiotics:  Zosyn  HPI/Subjective: No acute issues overnight reported  Objective: Filed Vitals:   06/15/15 0557  BP: 109/57  Pulse: 69  Temp: 98 F (36.7 C)  Resp: 20    Intake/Output Summary (Last 24 hours) at 06/15/15 1057 Last data filed at 06/15/15 0600  Gross per 24 hour  Intake    310 ml  Output    600 ml  Net   -290 ml   Filed Weights   06/11/15 2300  Weight: 68.3 kg (150 lb 9.2 oz)    Exam:   General:  Pt in nad, alert and awake  Cardiovascular: no cyanosis  Respiratory: no increased wob, no wheezes, equal chest rise   Abdomen: Soft, nondistended, no guarding  Musculoskeletal: No cyanosis or clubbing    Data Reviewed: Basic Metabolic Panel:  Recent Labs Lab 06/10/15 1241 06/11/15 1132 06/12/15 0519 06/12/15 1246 06/13/15 0530 06/15/15 0519  NA 128* 128* 129*  --  128* 129*  K 3.3* 3.5 3.4*  --  3.9 4.1  CL  --  88* 94*  --  95* 92*  CO2 '27 30 24  '$ --  25 29  GLUCOSE 117 108* 77  --  86 101*  BUN 12.'9 12 12  '$ --  8 10  CREATININE 0.8 0.79 0.78  --  0.80 0.95  CALCIUM 9.2 8.7* 8.6*  --  8.4* 8.9  MG  --   --   --  1.5* 1.9  --    Liver Function  Tests:  Recent Labs Lab 06/10/15 1241 06/11/15 1132 06/12/15 0519 06/13/15 0530  AST '18 24 23 20  '$ ALT '16 17 19 '$ 16*  ALKPHOS 94 84 81 68  BILITOT 0.74 1.0 1.1 0.7  PROT 6.5 6.8 6.1* 5.7*  ALBUMIN 3.4* 3.7 3.4* 3.2*    Recent Labs Lab 06/11/15 1132  LIPASE <10*   No results for input(s): AMMONIA in the last 168 hours. CBC:  Recent Labs Lab 06/10/15 1240 06/11/15 1132 06/12/15 0519 06/12/15 2305 06/15/15 0519  WBC 7.5 6.3 5.5 4.3 6.4  NEUTROABS 6.0 4.2  --   --   --   HGB 13.3 12.7* 12.6* 12.0* 13.5  HCT 38.3* 35.7* 35.2* 33.7* 38.5*  MCV 93.6 90.2 90.0 90.6 91.4  PLT 316 340 340 363 402*   Cardiac Enzymes: No results for input(s): CKTOTAL, CKMB, CKMBINDEX, TROPONINI in the last 168 hours. BNP (last 3 results) No results for input(s): BNP in the last 8760 hours.  ProBNP (last 3 results)  Recent Labs  07/27/14 0347  PROBNP 1683.0*    CBG: No results for input(s): GLUCAP in the last 168 hours.  Recent Results (from the past 240 hour(s))  Culture, body fluid-bottle     Status: None (Preliminary result)  Collection Time: 06/13/15 10:08 AM  Result Value Ref Range Status   Specimen Description GALL BLADDER  Final   Special Requests NONE  Final   Gram Stain   Final    GRAM NEGATIVE RODS IN BOTH AEROBIC AND ANAEROBIC BOTTLES CRITICAL RESULT CALLED TO, READ BACK BY AND VERIFIED WITH: C BEVERLY,RN AT 0740 06/14/15 BY L BENFIELD    Culture GRAM NEGATIVE RODS  Final   Report Status PENDING  Incomplete  Gram stain     Status: None (Preliminary result)   Collection Time: 06/13/15 10:08 AM  Result Value Ref Range Status   Specimen Description GALL BLADDER  Final   Special Requests NONE  Final   Gram Stain RARE MONONUCLEAR CELLS NO ORGANISMS SEEN   Final   Report Status PENDING  Incomplete     Studies: No results found.  Scheduled Meds: . acidophilus  1 capsule Oral Daily  . amoxicillin-clavulanate  1 tablet Oral Q12H  . apixaban  5 mg Oral BID  .  demeclocycline  150 mg Oral BID  . diltiazem  180 mg Oral Q1200  . feeding supplement (ENSURE ENLIVE)  237 mL Oral BID BM  . furosemide  40 mg Oral Daily  . lip balm  1 application Topical BID  . magnesium oxide  400 mg Oral Daily  . multivitamin with minerals  1 tablet Oral Daily  . pantoprazole  40 mg Oral q morning - 10a  . potassium chloride SA  20 mEq Oral Daily  . saccharomyces boulardii  250 mg Oral BID  . sodium chloride  3 mL Intravenous Q12H  . sucralfate  1 g Oral TID WC & HS  . tamsulosin  0.4 mg Oral Daily  . thiamine  100 mg Oral Daily   Continuous Infusions:      Time spent: > 10 minutes    Velvet Bathe  [facility] Pager [phone] If 7PM-7AM, please contact night-coverage at [email or web address], password [facility] 06/15/2015, 10:57 AM  LOS: 4 days

## 2015-06-15 NOTE — Care Management (Signed)
Important Message  Patient Details  Name: Anthony Curry MRN: [id number] Date of Birth: May 11, 1941   Medicare Important Message Given:  Yes-second notification given    Camillo Flaming 06/15/2015, 12:57 PM

## 2015-06-15 NOTE — Progress Notes (Signed)
Patient ID: Anthony Curry, male   DOB: February 20, 1941, 74 y.o.   MRN: [id number]    Referring Physician(s): CCS  Subjective:  Pt doing ok; some sl soreness at GB drain site; denies N/V  Allergies: Aspirin  Medications: Prior to Admission medications   Medication Sig Start Date End Date Taking? Authorizing Provider  ALPRAZolam (XANAX) 0.25 MG tablet Take 0.25 mg by mouth 3 (three) times daily as needed for anxiety or sleep.   Yes Historical Provider, MD  apixaban (ELIQUIS) 5 MG TABS tablet Take 1 tablet (5 mg total) by mouth 2 (two) times daily. 12/12/14  Yes Thompson Grayer, MD  Ascorbic Acid (VITAMIN C PO) Take 1,000 mg by mouth daily.    Yes Historical Provider, MD  B Complex-C (SUPER B COMPLEX PO) Take 1 each by mouth daily.    Yes Historical Provider, MD  Biotin 7500 MCG TABS Take 1 tablet by mouth daily.    Yes Historical Provider, MD  calcium carbonate (OS-CAL) 600 MG TABS Take 600 mg by mouth daily with breakfast.   Yes Historical Provider, MD  demeclocycline (DECLOMYCIN) 150 MG tablet Take 150 mg by mouth 2 (two) times daily.   Yes Historical Provider, MD  diltiazem (CARDIZEM CD) 180 MG 24 hr capsule Take 1 capsule (180 mg total) by mouth daily at 12 noon. 01/09/15  Yes Sherran Needs, NP  furosemide (LASIX) 40 MG tablet Take 1 tablet (40 mg total) by mouth daily. 05/11/15  Yes Thompson Grayer, MD  HYDROcodone-acetaminophen (NORCO/VICODIN) 5-325 MG per tablet Take 1 tablet by mouth every 6 (six) hours as needed. Patient taking differently: Take 1 tablet by mouth every 6 (six) hours as needed for severe pain.  06/01/15  Yes Ankit Nanavati, MD  KLOR-CON M20 20 MEQ tablet Take 20 mEq by mouth daily.  10/18/14  Yes Historical Provider, MD  Lactobacillus (ACIDOPHILUS) CAPS capsule Take 1 capsule by mouth daily.   Yes Historical Provider, MD  MAGNESIUM PO Take 400 mg by mouth daily.    Yes Historical Provider, MD  metoprolol tartrate (LOPRESSOR) 25 MG tablet Take 0.5 tablets (12.5 mg total) by mouth  2 (two) times daily. Patient taking differently: Take 6.25 mg by mouth 2 (two) times daily.  05/11/15  Yes Thompson Grayer, MD  mirtazapine (REMERON) 15 MG tablet Take 1 tablet (15 mg total) by mouth at bedtime. Patient taking differently: Take 15 mg by mouth at bedtime as needed (sleep).  01/19/15  Yes [name], MD  Multiple Vitamin (MULTIVITAMIN WITH MINERALS) TABS Take 1 tablet by mouth daily.   Yes Historical Provider, MD  Nivolumab (OPDIVO IV) Inject as directed every 2 weeks   Yes Historical Provider, MD  Nutritional Supplements (ENSURE COMPLETE PO) Take 1 Can by mouth 2 (two) times daily.   Yes Historical Provider, MD  Omega-3 Fatty Acids (FISH OIL PO) Take 1 tablet by mouth daily.   Yes Historical Provider, MD  ondansetron (ZOFRAN ODT) 8 MG disintegrating tablet Take 1 tablet (8 mg total) by mouth every 8 (eight) hours as needed for nausea. 06/01/15  Yes Varney Biles, MD  pantoprazole (PROTONIX) 40 MG tablet Take 40 mg by mouth every morning. 06/02/15  Yes Historical Provider, MD  tamsulosin (FLOMAX) 0.4 MG CAPS capsule Take 0.4 mg by mouth daily.    Yes Historical Provider, MD  thiamine (VITAMIN B-1) 100 MG tablet Take 1 tablet (100 mg total) by mouth daily. 09/11/13  Yes Erick Colace, NP  vitamin E 400 UNIT capsule Take 400  Units by mouth daily.   Yes Historical Provider, MD  sucralfate (CARAFATE) 1 G tablet Take 1 tablet (1 g total) by mouth 4 (four) times daily -  with meals and at bedtime. Patient not taking: Reported on 06/11/2015 06/01/15   Varney Biles, MD     Vital Signs: BP 109/57 mmHg  Pulse 69  Temp(Src) 98 F (36.7 C) (Oral)  Resp 20  Ht '5\' 8"'$  (1.727 m)  Wt 150 lb 9.2 oz (68.3 kg)  BMI 22.90 kg/m2  SpO2 95%  Physical Exam GB drain intact, insertion site ok, mildly tender, output 285 cc green bile; cx's pend(gm neg rods)  Imaging: Nm Hepatobiliary Liver Func  06/11/2015   CLINICAL DATA:  Right upper quadrant abdominal pain, nausea and vomiting intermittently for  the past 2 weeks. Cholelithiasis and gallbladder sludge and possible gallbladder mass at ultrasound today.  EXAM: NUCLEAR MEDICINE HEPATOBILIARY IMAGING  TECHNIQUE: Sequential images of the abdomen were obtained out to 60 minutes following intravenous administration of radiopharmaceutical.  RADIOPHARMACEUTICALS:  5.1 mCi Tc-74m Choletec IV  COMPARISON:  Abdomen ultrasound obtained today and abdomen CT dated 06/09/2015.  FINDINGS: Normal visualization of the liver, bile ducts and small bowel. The gallbladder was not visualized 1 hour after injection. Therefore, the patient was given 2.8 mg of morphine and an additional 1 mCi of technetium [id number] 1 hour after injection. This resulted in faint visualization of the gallbladder.  IMPRESSION: Delayed visualization of the gallbladder, only seen after administration of 2.8 mg of morphine. No cystic duct obstruction.   Electronically Signed   By: StClaudie Revering.D.   On: 06/11/2015 17:50   Ir Perc Cholecystostomy  06/13/2015   CLINICAL DATA:  Acute cholecystitis  EXAM: PERCUTANEOUS CHOLECYSTOSTOMY TUBE PLACEMENT WITH ULTRASOUND AND FLUOROSCOPIC GUIDANCE:  FLUOROSCOPY TIME:  6 seconds, 1 mGy  TECHNIQUE: The procedure, risks (including but not limited to bleeding, infection, organ damage ), benefits, and alternatives were explained to the patient. Questions regarding the procedure were encouraged and answered. The patient understands and consents to the procedure. Survey ultrasound of the abdomen was performed and an appropriate skin entry site was identified. Skin site was marked, prepped with Betadine, and draped in usual sterile fashion, and infiltrated locally with 1% lidocaine.  Intravenous Fentanyl and Versed were administered as conscious sedation during continuous cardiorespiratory monitoring by the radiology RN, with a total moderate sedation time of 8 minutes.  Under real-time ultrasound guidance, gallbladder was accessed using a subcostal approach with a 19  gauge needle. Ultrasound image documentation was saved. Bile returned through the hub. Needle was exchanged over a guidewire for transitional dilator which allowed placement of 035 J wire. Over this, a 10.2 French pigtail catheter was advanced and formed centrally in the gallbladder lumen. 20 mL of purulent bile were aspirated, sent for culture. Small contrast injection confirmed appropriate position on fluoroscopy. Catheter secured externally with 0 Prolene suture and placed external drain bag. Patient tolerated the procedure well.  COMPLICATIONS: COMPLICATIONS none  IMPRESSION: 1. Technically successful percutaneous cholecystostomy tube placement with ultrasound and fluoroscopic guidance.   Electronically Signed   By: D Lucrezia Europe.D.   On: 06/13/2015 11:25    Labs:  CBC:  Recent Labs  06/11/15 1132 06/12/15 0519 06/12/15 2305 06/15/15 0519  WBC 6.3 5.5 4.3 6.4  HGB 12.7* 12.6* 12.0* 13.5  HCT 35.7* 35.2* 33.7* 38.5*  PLT 340 340 363 402*    COAGS:  Recent Labs  09/01/14 1148 06/11/15 1905 06/12/15 0519 06/12/15  8979 06/12/15 1246 06/12/15 2300  INR 1.1*  --  1.26  --   --   --   APTT  --  45*  --  70* 104* 66*    BMP:  Recent Labs  06/11/15 1132 06/12/15 0519 06/13/15 0530 06/15/15 0519  NA 128* 129* 128* 129*  K 3.5 3.4* 3.9 4.1  CL 88* 94* 95* 92*  CO2 '30 24 25 29  '$ GLUCOSE 108* 77 86 101*  BUN '12 12 8 10  '$ CALCIUM 8.7* 8.6* 8.4* 8.9  CREATININE 0.79 0.78 0.80 0.95  GFRNONAA >60 >60 >60 >60  GFRAA >60 >60 >60 >60    LIVER FUNCTION TESTS:  Recent Labs  06/10/15 1241 06/11/15 1132 06/12/15 0519 06/13/15 0530  BILITOT 0.74 1.0 1.1 0.7  AST '18 24 23 20  '$ ALT '16 17 19 '$ 16*  ALKPHOS 94 84 81 68  PROT 6.5 6.8 6.1* 5.7*  ALBUMIN 3.4* 3.7 3.4* 3.2*    Assessment and Plan:  S/p perc cholecystostomy 7/9; afebrile; bile cx's pend(gm - rods);WBC/HGB nl; check final cx's /sens; drain should remain in place at least 4-6 weeks unless cholecystectomy done in  interim; once pt d/c'd home recommend once daily or every other day flushing of drain with 5 cc sterile NS, recording of output and dressing changes every 2-3 days- call [phone] or [phone] with any questions.   Signed: D. Rowe [name] 06/15/2015, 1:12 PM   I spent a total of 15 minutes in face to face in clinical consultation/evaluation, greater than 50% of which was counseling/coordinating care for perc cholecystostomy

## 2015-06-15 NOTE — Progress Notes (Signed)
Patient ID: Anthony Curry, male   DOB: Mar 10, 1941, 74 y.o.   MRN: [id number]     [facility]      [address] [place], [place] [address]    Phone: [phone] FAX: [phone]     Subjective: Advanced to solids, waiting for tray.  VSS.  Afebrile.  Little pain to pain.  No n/v.  Voiding.    Objective:  Vital signs:  Filed Vitals:   06/14/15 0523 06/14/15 1447 06/14/15 2115 06/15/15 0557  BP: 113/58 115/62 117/59 109/57  Pulse: 73 64 67 69  Temp: 98 F (36.7 C) 97.9 F (36.6 C) 98.2 F (36.8 C) 98 F (36.7 C)  TempSrc: Oral Oral Oral Oral  Resp: _0 Height:      Weight:      SpO2: 96% 99% 98% 95%    Last BM Date: 06/12/15  Intake/Output   Yesterday:  07/10 0701 - 07/11 0700 In: 310 [P.O.:110; IV Piggyback:200] Out: 685 [Urine:400; Drains:285] This shift:    I/O last 3 completed shifts: In: 495 [P.O.:240; Other:5; IV Piggyback:250] Out: 9150 [Urine:550; Drains:645]    Physical Exam: General: Pt awake/alert/oriented x4 in no acute distress Chest: cta. No chest wall pain w good excursion CV:  Pulses intact.  Regular rhythm MS: Normal AROM mjr joints.  No obvious deformity Abdomen: Soft.  Nondistended. Non tender.  RUQ drain with bilious output.  No evidence of peritonitis.  No incarcerated hernias. Ext:  SCDs BLE.  No mjr edema.  No cyanosis Skin: No petechiae / purpura   Problem List:   Active Problems:   Hypertensive cardiovascular disease   BPH (benign prostatic hyperplasia)   GERD (gastroesophageal reflux disease)   Lung cancer   Acute cholecystitis   Atrial fibrillation    Results:   Labs: Results for orders placed or performed during the hospital encounter of 06/11/15 (from the past 48 hour(s))  CBC     Status: Abnormal   Collection Time: 06/15/15  5:19 AM  Result Value Ref Range   WBC 6.4 4.0 - 10.5 K/uL   RBC 4.21 (L) 4.22 - 5.81 MIL/uL   Hemoglobin 13.5 13.0 - 17.0 g/dL   HCT  38.5 (L) 39.0 - 52.0 %   MCV 91.4 78.0 - 100.0 fL   MCH 32.1 26.0 - 34.0 pg   MCHC 35.1 30.0 - 36.0 g/dL   RDW 13.6 11.5 - 15.5 %   Platelets 402 (H) 150 - 400 K/uL  Basic metabolic panel     Status: Abnormal   Collection Time: 06/15/15  5:19 AM  Result Value Ref Range   Sodium 129 (L) 135 - 145 mmol/L   Potassium 4.1 3.5 - 5.1 mmol/L   Chloride 92 (L) 101 - 111 mmol/L   CO2 29 22 - 32 mmol/L   Glucose, Bld 101 (H) 65 - 99 mg/dL   BUN 10 6 - 20 mg/dL   Creatinine, Ser 0.95 0.61 - 1.24 mg/dL   Calcium 8.9 8.9 - 10.3 mg/dL   GFR calc non Af Amer >60 >60 mL/min   GFR calc Af Amer >60 >60 mL/min    Comment: (NOTE) The eGFR has been calculated using the CKD EPI equation. This calculation has not been validated in all clinical situations. eGFR's persistently <60 mL/min signify possible Chronic Kidney Disease.    Anion gap 8 5 - 15    Imaging / Studies: No results found.  Medications / Allergies:  Scheduled  Meds: . acidophilus  1 capsule Oral Daily  . amoxicillin-clavulanate  1 tablet Oral Q12H  . apixaban  5 mg Oral BID  . demeclocycline  150 mg Oral BID  . diltiazem  180 mg Oral Q1200  . feeding supplement (ENSURE ENLIVE)  237 mL Oral BID BM  . furosemide  40 mg Oral Daily  . lip balm  1 application Topical BID  . magnesium oxide  400 mg Oral Daily  . multivitamin with minerals  1 tablet Oral Daily  . pantoprazole  40 mg Oral q morning - 10a  . potassium chloride SA  20 mEq Oral Daily  . saccharomyces boulardii  250 mg Oral BID  . sodium chloride  3 mL Intravenous Q12H  . sucralfate  1 g Oral TID WC & HS  . tamsulosin  0.4 mg Oral Daily  . thiamine  100 mg Oral Daily   Continuous Infusions:  PRN Meds:.sodium chloride, acetaminophen **OR** acetaminophen, ALPRAZolam, alum & mag hydroxide-simeth, diphenhydrAMINE, HYDROcodone-acetaminophen, magic mouthwash, menthol-cetylpyridinium, mirtazapine, morphine injection, ondansetron, ondansetron, phenol, sodium  chloride  Antibiotics: Anti-infectives    Start     Dose/Rate Route Frequency Ordered Stop   06/15/15 1100  amoxicillin-clavulanate (AUGMENTIN) 875-125 MG per tablet 1 tablet     1 tablet Oral Every 12 hours 06/15/15 1046 06/19/15 0959   06/11/15 2200  demeclocycline (DECLOMYCIN) tablet 150 mg     150 mg Oral 2 times daily 06/11/15 1459     06/11/15 1415  piperacillin-tazobactam (ZOSYN) IVPB 3.375 g  Status:  Discontinued     3.375 g 12.5 mL/hr over 240 Minutes Intravenous 3 times per day 06/11/15 1409 06/15/15 1046        Assessment/Plan Acute cholecystitis -s/p cholecystostomy tube placement.  On regular diet, will ensure he is able to tolerate.  If able, DC later today. -drain care teaching. -zosyn D#4.  Change to Augmentin for total of 7 days.  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 S/p palliative radiation to left adrenal gland 1/16  Right breast enlargement-further work up per Dr. [name]  Atrial fibrillation-Eliquis resumed.   Hyponatremia-Na 129 which is stable.  FEN-Na 129 which is baseline.  Hypomagnesemia -resolved.  Hypokalemia-resolved.  Hypertension-continue home meds Dispo-DC later today.  OP f/u with Dr. Dalbert Batman and Dr. [name].    Erby Pian, [facility] Pager [phone])   06/15/2015 10:52 AM

## 2015-06-15 NOTE — Progress Notes (Signed)
DC instructions reviewed with patient and daughter. Home med rec carefully reviewed and patient verbalized when next dose of each medication is due next. 3 Rx's given. F/u in place for 8/8. Home health has been established. Patient sent home with some drain supplies as well. Patient and daughter taught how to flush drain, change drain dressing daily and when soiled, and how to empty drainage. Patient instructed to keep a daily log of the drainage amount when they empty it and to take it to his f/u apt. No changes noted since am assessment. IV DC'ed. Patient to DC to home with daughter.

## 2015-06-16 DIAGNOSIS — K810 Acute cholecystitis: Secondary | ICD-10-CM | POA: Diagnosis not present

## 2015-06-16 DIAGNOSIS — I4891 Unspecified atrial fibrillation: Secondary | ICD-10-CM | POA: Diagnosis not present

## 2015-06-16 DIAGNOSIS — Z85118 Personal history of other malignant neoplasm of bronchus and lung: Secondary | ICD-10-CM | POA: Diagnosis not present

## 2015-06-16 DIAGNOSIS — I10 Essential (primary) hypertension: Secondary | ICD-10-CM | POA: Diagnosis not present

## 2015-06-16 DIAGNOSIS — K219 Gastro-esophageal reflux disease without esophagitis: Secondary | ICD-10-CM | POA: Diagnosis not present

## 2015-06-16 DIAGNOSIS — Z96612 Presence of left artificial shoulder joint: Secondary | ICD-10-CM | POA: Diagnosis not present

## 2015-06-17 DIAGNOSIS — K219 Gastro-esophageal reflux disease without esophagitis: Secondary | ICD-10-CM | POA: Diagnosis not present

## 2015-06-17 DIAGNOSIS — I4891 Unspecified atrial fibrillation: Secondary | ICD-10-CM | POA: Diagnosis not present

## 2015-06-17 DIAGNOSIS — Z96612 Presence of left artificial shoulder joint: Secondary | ICD-10-CM | POA: Diagnosis not present

## 2015-06-17 DIAGNOSIS — I10 Essential (primary) hypertension: Secondary | ICD-10-CM | POA: Diagnosis not present

## 2015-06-17 DIAGNOSIS — Z85118 Personal history of other malignant neoplasm of bronchus and lung: Secondary | ICD-10-CM | POA: Diagnosis not present

## 2015-06-17 DIAGNOSIS — K810 Acute cholecystitis: Secondary | ICD-10-CM | POA: Diagnosis not present

## 2015-06-17 LAB — CULTURE, BODY FLUID-BOTTLE

## 2015-06-17 LAB — CULTURE, BODY FLUID W GRAM STAIN -BOTTLE

## 2015-06-19 DIAGNOSIS — Z96612 Presence of left artificial shoulder joint: Secondary | ICD-10-CM | POA: Diagnosis not present

## 2015-06-19 DIAGNOSIS — I10 Essential (primary) hypertension: Secondary | ICD-10-CM | POA: Diagnosis not present

## 2015-06-19 DIAGNOSIS — K219 Gastro-esophageal reflux disease without esophagitis: Secondary | ICD-10-CM | POA: Diagnosis not present

## 2015-06-19 DIAGNOSIS — I4891 Unspecified atrial fibrillation: Secondary | ICD-10-CM | POA: Diagnosis not present

## 2015-06-19 DIAGNOSIS — K810 Acute cholecystitis: Secondary | ICD-10-CM | POA: Diagnosis not present

## 2015-06-19 DIAGNOSIS — Z85118 Personal history of other malignant neoplasm of bronchus and lung: Secondary | ICD-10-CM | POA: Diagnosis not present

## 2015-06-23 DIAGNOSIS — K219 Gastro-esophageal reflux disease without esophagitis: Secondary | ICD-10-CM | POA: Diagnosis not present

## 2015-06-23 DIAGNOSIS — I10 Essential (primary) hypertension: Secondary | ICD-10-CM | POA: Diagnosis not present

## 2015-06-23 DIAGNOSIS — K810 Acute cholecystitis: Secondary | ICD-10-CM | POA: Diagnosis not present

## 2015-06-23 DIAGNOSIS — Z96612 Presence of left artificial shoulder joint: Secondary | ICD-10-CM | POA: Diagnosis not present

## 2015-06-23 DIAGNOSIS — I4891 Unspecified atrial fibrillation: Secondary | ICD-10-CM | POA: Diagnosis not present

## 2015-06-23 DIAGNOSIS — Z85118 Personal history of other malignant neoplasm of bronchus and lung: Secondary | ICD-10-CM | POA: Diagnosis not present

## 2015-06-24 ENCOUNTER — Ambulatory Visit (HOSPITAL_BASED_OUTPATIENT_CLINIC_OR_DEPARTMENT_OTHER): Payer: Medicare Other | Admitting: Nurse Practitioner

## 2015-06-24 ENCOUNTER — Ambulatory Visit (HOSPITAL_BASED_OUTPATIENT_CLINIC_OR_DEPARTMENT_OTHER): Payer: Medicare Other

## 2015-06-24 ENCOUNTER — Other Ambulatory Visit (HOSPITAL_BASED_OUTPATIENT_CLINIC_OR_DEPARTMENT_OTHER): Payer: Medicare Other

## 2015-06-24 ENCOUNTER — Other Ambulatory Visit: Payer: Medicare Other

## 2015-06-24 VITALS — BP 126/55 | HR 78 | Temp 97.8°F | Resp 17 | Ht 68.0 in | Wt 153.4 lb

## 2015-06-24 DIAGNOSIS — C3492 Malignant neoplasm of unspecified part of left bronchus or lung: Secondary | ICD-10-CM

## 2015-06-24 DIAGNOSIS — C3410 Malignant neoplasm of upper lobe, unspecified bronchus or lung: Secondary | ICD-10-CM | POA: Diagnosis not present

## 2015-06-24 DIAGNOSIS — C3431 Malignant neoplasm of lower lobe, right bronchus or lung: Secondary | ICD-10-CM

## 2015-06-24 DIAGNOSIS — Z79899 Other long term (current) drug therapy: Secondary | ICD-10-CM | POA: Diagnosis not present

## 2015-06-24 DIAGNOSIS — C7972 Secondary malignant neoplasm of left adrenal gland: Secondary | ICD-10-CM | POA: Diagnosis not present

## 2015-06-24 DIAGNOSIS — I4891 Unspecified atrial fibrillation: Secondary | ICD-10-CM

## 2015-06-24 DIAGNOSIS — Z5112 Encounter for antineoplastic immunotherapy: Secondary | ICD-10-CM

## 2015-06-24 DIAGNOSIS — R0789 Other chest pain: Secondary | ICD-10-CM

## 2015-06-24 DIAGNOSIS — R5382 Chronic fatigue, unspecified: Secondary | ICD-10-CM

## 2015-06-24 LAB — CBC WITH DIFFERENTIAL/PLATELET
BASO%: 0.5 % (ref 0.0–2.0)
BASOS ABS: 0 10*3/uL (ref 0.0–0.1)
EOS%: 5.1 % (ref 0.0–7.0)
Eosinophils Absolute: 0.4 10*3/uL (ref 0.0–0.5)
HEMATOCRIT: 39.7 % (ref 38.4–49.9)
HGB: 13.6 g/dL (ref 13.0–17.1)
LYMPH%: 10.7 % — AB (ref 14.0–49.0)
MCH: 32.1 pg (ref 27.2–33.4)
MCHC: 34.1 g/dL (ref 32.0–36.0)
MCV: 93.9 fL (ref 79.3–98.0)
MONO#: 0.6 10*3/uL (ref 0.1–0.9)
MONO%: 7.9 % (ref 0.0–14.0)
NEUT#: 5.4 10*3/uL (ref 1.5–6.5)
NEUT%: 75.8 % — ABNORMAL HIGH (ref 39.0–75.0)
Platelets: 328 10*3/uL (ref 140–400)
RBC: 4.23 10*6/uL (ref 4.20–5.82)
RDW: 13.8 % (ref 11.0–14.6)
WBC: 7.2 10*3/uL (ref 4.0–10.3)
lymph#: 0.8 10*3/uL — ABNORMAL LOW (ref 0.9–3.3)

## 2015-06-24 LAB — COMPREHENSIVE METABOLIC PANEL (CC13)
ALK PHOS: 96 U/L (ref 40–150)
ALT: 21 U/L (ref 0–55)
AST: 24 U/L (ref 5–34)
Albumin: 3.5 g/dL (ref 3.5–5.0)
Anion Gap: 9 mEq/L (ref 3–11)
BUN: 11.6 mg/dL (ref 7.0–26.0)
CALCIUM: 9.4 mg/dL (ref 8.4–10.4)
CHLORIDE: 99 meq/L (ref 98–109)
CO2: 23 meq/L (ref 22–29)
Creatinine: 0.8 mg/dL (ref 0.7–1.3)
EGFR: 88 mL/min/{1.73_m2} — ABNORMAL LOW (ref >90)
Glucose: 137 mg/dl (ref 70–140)
Potassium: 3.8 mEq/L (ref 3.5–5.1)
Sodium: 131 mEq/L — ABNORMAL LOW (ref 136–145)
Total Bilirubin: 0.57 mg/dL (ref 0.20–1.20)
Total Protein: 6 g/dL — ABNORMAL LOW (ref 6.4–8.3)

## 2015-06-24 LAB — TSH CHCC: TSH: 4.088 m(IU)/L (ref 0.320–4.118)

## 2015-06-24 MED ORDER — SODIUM CHLORIDE 0.9 % IV SOLN
Freq: Once | INTRAVENOUS | Status: AC
  Administered 2015-06-24: 13:00:00 via INTRAVENOUS

## 2015-06-24 MED ORDER — NIVOLUMAB CHEMO INJECTION 100 MG/10ML
3.0000 mg/kg | Freq: Once | INTRAVENOUS | Status: AC
  Administered 2015-06-24: 220 mg via INTRAVENOUS
  Filled 2015-06-24: qty 22

## 2015-06-24 NOTE — Progress Notes (Addendum)
[facility] [facility] OFFICE PROGRESS NOTE   DIAGNOSIS: Stage IV (T1 A., N0, M1 B.) non-small cell lung cancer, adenocarcinoma with negative EGFR mutation and negative ALK gene translocation diagnosed in October of 2014.   Molecular profile: Negative for RET, ALK, BRAF, MET, EGFR.  Positive for ERBB2 A775T, [id number], KRAS G13E, MAP2K1 D67N (see full report).   PRIOR THERAPY:  1) Systemic chemotherapy with carboplatin for AUC of 5 and Alimta 500 mg/M2 every 3 weeks, status post 6 cycles with stable disease. First dose on 10/23/2013. 2) Maintenance systemic chemotherapy with single agent Alimta 500 mg/M2 every 3 weeks. Status post 8 cycles.  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 Sondra Come completed on 12/16/2014.  CURRENT THERAPY: Immunotherapy with Nivolumab 3 MG/KG every 2 weeks, status post 9 cycles. First cycle 01/28/2015    INTERVAL HISTORY:   [name] returns as scheduled. He completed cycle 9 nivolumab 06/10/2015. He was hospitalized 06/11/2015 through 06/15/2015 with acute cholecystitis. A cholecystostomy tube was placed. He lost his appetite during the hospitalization. He has noted some improvement since discharge. His main complaint is being "tired". He denies nausea/vomiting. No mouth sores. No significant diarrhea. He has occasional loose stools. He periodically notes a rash on his forehead. He denies shortness of breath. No cough. No fever.  Objective:  Vital signs in last 24 hours:  Blood pressure 126/55, pulse 78, temperature 97.8 F (36.6 C), temperature source Oral, resp. rate 17, height _0  (1.727 m), weight 153 lb 6.4 oz (69.582 kg), SpO2 99 %.    HEENT: No thrush or ulcers. Resp: Lungs clear bilaterally. Cardio: Regular rate and rhythm. GI: Abdomen is soft. No hepatomegaly. Right abdomen drain. Vascular: Right lower leg is larger than the left lower leg. He reports this is chronic. Calves soft and  nontender.     Lab Results:  Lab Results  Component Value Date   WBC 7.2 06/24/2015   HGB 13.6 06/24/2015   HCT 39.7 06/24/2015   MCV 93.9 06/24/2015   PLT 328 06/24/2015   NEUTROABS 5.4 06/24/2015    Imaging:  No results found.  Medications: I have reviewed the patient's current medications.  Assessment/Plan: 1) Stage IV non-small cell lung cancer, adenocarcinoma status post induction chemotherapy with carboplatin and Alimta; maintenance treatment with single agent Alimta status post 8 cycles, then observation.  He had evidence for disease progression on the left adrenal gland and the patient underwent palliative radiotherapy to the left adrenal gland under the care of Dr. Sondra Come completed on 12/16/2014. He is currently undergoing treatment with immunotherapy with Nivolumab status post 9 cycles and tolerating his treatment fairly well. The recent CT scan of the chest, abdomen and pelvis showed no evidence for disease progression.  2)Acute cholecystitis: Seen on the recent CT scan. He was hospitalized 06/11/2015 and underwent placement of a cholecystostomy tube. He is following up with Dr. Dalbert Batman.  3) Atrial fibrillation: He continues his current medication with Cardizem, digoxin and metoprolol in addition to Eliquis. Followup visit with his cardiologist Dr. [name].   Disposition: The plan is to continue nivolumab. He will receive cycle 10 today. He will return for a follow-up visit and cycle 11 in 2 weeks. He will contact the office in the interim with any problems.  Patient seen with Dr. [name].  Ned Card ANP/GNP-BC   06/24/2015  12:02 PM  ADDENDUM: Hematology/Oncology Attending: I had a face to face encounter with the patient. I recommended his care plan.  This is a very pleasant 74 years old white male with metastatic non-small cell lung cancer who is currently undergoing treatment with immunotherapy with Nivolumab status post 9 cycles. The patient was recently  treated for acute cholecystitis and has drainage performed by interventional radiology. He is feeling much better but continues to have mild pain in the right upper quadrant. I recommended for the patient to proceed with cycle #10 of his immunotherapy today as a scheduled. He would come back for follow-up visit in 2 weeks for reevaluation before starting cycle #11. The patient was advised to call immediately if he has any concerning symptoms in the interval.  Disclaimer: This note was dictated with voice recognition software. Similar sounding words can inadvertently be transcribed and may be missed upon review. Eilleen Kempf., MD 06/28/2015

## 2015-06-24 NOTE — Patient Instructions (Signed)
[facility] Discharge Instructions for Patients  Today you received the following: Nivolumab   To help prevent nausea and vomiting after your treatment, we encourage you to take your nausea medication as directed.    If you develop nausea and vomiting that is not controlled by your nausea medication, call the clinic.   BELOW ARE SYMPTOMS THAT SHOULD BE REPORTED IMMEDIATELY:  *FEVER GREATER THAN 100.5 F  *CHILLS WITH OR WITHOUT FEVER  NAUSEA AND VOMITING THAT IS NOT CONTROLLED WITH YOUR NAUSEA MEDICATION  *UNUSUAL SHORTNESS OF BREATH  *UNUSUAL BRUISING OR BLEEDING  TENDERNESS IN MOUTH AND THROAT WITH OR WITHOUT PRESENCE OF ULCERS  *URINARY PROBLEMS  *BOWEL PROBLEMS  UNUSUAL RASH Items with * indicate a potential emergency and should be followed up as soon as possible.  Feel free to call the clinic you have any questions or concerns. The clinic phone number is [phone].  Please show the CHEMO ALERT CARD at check-in to the Emergency Department and triage nurse.   

## 2015-06-26 DIAGNOSIS — K219 Gastro-esophageal reflux disease without esophagitis: Secondary | ICD-10-CM | POA: Diagnosis not present

## 2015-06-26 DIAGNOSIS — I10 Essential (primary) hypertension: Secondary | ICD-10-CM | POA: Diagnosis not present

## 2015-06-26 DIAGNOSIS — K810 Acute cholecystitis: Secondary | ICD-10-CM | POA: Diagnosis not present

## 2015-06-26 DIAGNOSIS — Z85118 Personal history of other malignant neoplasm of bronchus and lung: Secondary | ICD-10-CM | POA: Diagnosis not present

## 2015-06-26 DIAGNOSIS — Z96612 Presence of left artificial shoulder joint: Secondary | ICD-10-CM | POA: Diagnosis not present

## 2015-06-26 DIAGNOSIS — I4891 Unspecified atrial fibrillation: Secondary | ICD-10-CM | POA: Diagnosis not present

## 2015-06-28 DIAGNOSIS — Z5112 Encounter for antineoplastic immunotherapy: Secondary | ICD-10-CM | POA: Insufficient documentation

## 2015-06-29 DIAGNOSIS — K219 Gastro-esophageal reflux disease without esophagitis: Secondary | ICD-10-CM | POA: Diagnosis not present

## 2015-06-29 DIAGNOSIS — Z96612 Presence of left artificial shoulder joint: Secondary | ICD-10-CM | POA: Diagnosis not present

## 2015-06-29 DIAGNOSIS — Z85118 Personal history of other malignant neoplasm of bronchus and lung: Secondary | ICD-10-CM | POA: Diagnosis not present

## 2015-06-29 DIAGNOSIS — I4891 Unspecified atrial fibrillation: Secondary | ICD-10-CM | POA: Diagnosis not present

## 2015-06-29 DIAGNOSIS — K810 Acute cholecystitis: Secondary | ICD-10-CM | POA: Diagnosis not present

## 2015-06-29 DIAGNOSIS — I10 Essential (primary) hypertension: Secondary | ICD-10-CM | POA: Diagnosis not present

## 2015-07-08 ENCOUNTER — Ambulatory Visit (HOSPITAL_BASED_OUTPATIENT_CLINIC_OR_DEPARTMENT_OTHER): Payer: Medicare Other

## 2015-07-08 ENCOUNTER — Encounter: Payer: Self-pay | Admitting: Internal Medicine

## 2015-07-08 ENCOUNTER — Telehealth: Payer: Self-pay | Admitting: Internal Medicine

## 2015-07-08 ENCOUNTER — Other Ambulatory Visit (HOSPITAL_BASED_OUTPATIENT_CLINIC_OR_DEPARTMENT_OTHER): Payer: Medicare Other

## 2015-07-08 ENCOUNTER — Telehealth: Payer: Self-pay | Admitting: *Deleted

## 2015-07-08 ENCOUNTER — Ambulatory Visit (HOSPITAL_BASED_OUTPATIENT_CLINIC_OR_DEPARTMENT_OTHER): Payer: Medicare Other | Admitting: Internal Medicine

## 2015-07-08 VITALS — BP 139/61 | HR 61 | Temp 97.6°F | Resp 18 | Ht 68.0 in | Wt 155.6 lb

## 2015-07-08 DIAGNOSIS — C3410 Malignant neoplasm of upper lobe, unspecified bronchus or lung: Secondary | ICD-10-CM

## 2015-07-08 DIAGNOSIS — C3431 Malignant neoplasm of lower lobe, right bronchus or lung: Secondary | ICD-10-CM

## 2015-07-08 DIAGNOSIS — I4892 Unspecified atrial flutter: Secondary | ICD-10-CM

## 2015-07-08 DIAGNOSIS — C3492 Malignant neoplasm of unspecified part of left bronchus or lung: Secondary | ICD-10-CM

## 2015-07-08 DIAGNOSIS — C7972 Secondary malignant neoplasm of left adrenal gland: Secondary | ICD-10-CM

## 2015-07-08 DIAGNOSIS — Z5112 Encounter for antineoplastic immunotherapy: Secondary | ICD-10-CM

## 2015-07-08 DIAGNOSIS — R530 Neoplastic (malignant) related fatigue: Secondary | ICD-10-CM

## 2015-07-08 DIAGNOSIS — K810 Acute cholecystitis: Secondary | ICD-10-CM | POA: Diagnosis not present

## 2015-07-08 LAB — CBC WITH DIFFERENTIAL/PLATELET
BASO%: 0.8 % (ref 0.0–2.0)
Basophils Absolute: 0 10*3/uL (ref 0.0–0.1)
EOS%: 6.2 % (ref 0.0–7.0)
Eosinophils Absolute: 0.3 10*3/uL (ref 0.0–0.5)
HCT: 41.9 % (ref 38.4–49.9)
HGB: 14.6 g/dL (ref 13.0–17.1)
LYMPH%: 26 % (ref 14.0–49.0)
MCH: 32.7 pg (ref 27.2–33.4)
MCHC: 34.8 g/dL (ref 32.0–36.0)
MCV: 93.9 fL (ref 79.3–98.0)
MONO#: 0.6 10*3/uL (ref 0.1–0.9)
MONO%: 12.3 % (ref 0.0–14.0)
NEUT#: 2.5 10*3/uL (ref 1.5–6.5)
NEUT%: 54.7 % (ref 39.0–75.0)
Platelets: 236 10*3/uL (ref 140–400)
RBC: 4.46 10*6/uL (ref 4.20–5.82)
RDW: 14.6 % (ref 11.0–14.6)
WBC: 4.6 10*3/uL (ref 4.0–10.3)
lymph#: 1.2 10*3/uL (ref 0.9–3.3)

## 2015-07-08 LAB — COMPREHENSIVE METABOLIC PANEL (CC13)
ALT: 20 U/L (ref 0–55)
ANION GAP: 7 meq/L (ref 3–11)
AST: 23 U/L (ref 5–34)
Albumin: 4 g/dL (ref 3.5–5.0)
Alkaline Phosphatase: 100 U/L (ref 40–150)
BUN: 12.8 mg/dL (ref 7.0–26.0)
CO2: 27 mEq/L (ref 22–29)
Calcium: 9.3 mg/dL (ref 8.4–10.4)
Chloride: 99 mEq/L (ref 98–109)
Creatinine: 0.7 mg/dL (ref 0.7–1.3)
EGFR: >90 mL/min/{1.73_m2} (ref >90)
GLUCOSE: 87 mg/dL (ref 70–140)
Potassium: 4 mEq/L (ref 3.5–5.1)
SODIUM: 133 meq/L — AB (ref 136–145)
Total Bilirubin: 0.6 mg/dL (ref 0.20–1.20)
Total Protein: 6.5 g/dL (ref 6.4–8.3)

## 2015-07-08 MED ORDER — NIVOLUMAB CHEMO INJECTION 100 MG/10ML
3.0000 mg/kg | Freq: Once | INTRAVENOUS | Status: AC
  Administered 2015-07-08: 220 mg via INTRAVENOUS
  Filled 2015-07-08: qty 22

## 2015-07-08 MED ORDER — SODIUM CHLORIDE 0.9 % IV SOLN
Freq: Once | INTRAVENOUS | Status: AC
  Administered 2015-07-08: 15:00:00 via INTRAVENOUS

## 2015-07-08 NOTE — Telephone Encounter (Signed)
Pt confirmed labs/ov per 08/03 POF, gave pt avs and calendar.Cherylann Banas, sent msg to add chemo

## 2015-07-08 NOTE — Telephone Encounter (Signed)
Per staff message and POF I have scheduled appts. Advised scheduler of appts. JMW  

## 2015-07-08 NOTE — Progress Notes (Signed)
[facility] [phone]   Fax:([phone]) [place], [place] [address]  DIAGNOSIS: Stage IV (T1 A., N0, M1 B.) non-small cell lung cancer, adenocarcinoma with negative EGFR mutation and negative ALK gene translocation diagnosed in October of 2014.   Molecular profile: Negative for RET, ALK, BRAF, MET, EGFR.  Positive for ERBB2 A775T, [id number], KRAS G13E, MAP2K1 D67N (see full report).   PRIOR THERAPY:  1) Systemic chemotherapy with carboplatin for AUC of 5 and Alimta 500 mg/M2 every 3 weeks, status post 6 cycles with stable disease. First dose on 10/23/2013. 2) Maintenance systemic chemotherapy with single agent Alimta 500 mg/M2 every 3 weeks. Status post 8 cycles.   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 Sondra Come completed on 12/16/2014.  CURRENT THERAPY: Immunotherapy with Nivolumab 3 MG/KG every 2 weeks, status post 10 cycles. First cycle 01/28/2015  CHEMOTHERAPY INTENT: Palliative  CURRENT # OF CHEMOTHERAPY CYCLES: 11 CURRENT ANTIEMETICS: Zofran, Compazine and dexamethasone  CURRENT SMOKING STATUS: Former smoker  ORAL CHEMOTHERAPY AND CONSENT: None  CURRENT BISPHOSPHONATES USE: None  PAIN MANAGEMENT: 0/10  NARCOTICS INDUCED CONSTIPATION: None  LIVING WILL AND CODE STATUS: ?   INTERVAL HISTORY: Anthony Curry 74 y.o. male returns to the clinic today for followup visit accompanied by his wife. He is tolerating his current treatment with immunotherapy with Nivolumab fairly well with no significant adverse effects except for mild fatigue. The cholecystotomy drainage is currently draining a small amount of clear fluid. He is scheduled to see Dr. Dalbert Batman next week for reevaluation and consideration of cholecystectomy. He denied having any significant chest pain, shortness of breath, cough or hemoptysis. He denied having any significant  fever or chills, no nausea or vomiting. He is here today to start cycle #11 of his immunotherapy.  MEDICAL HISTORY: Past Medical History  Diagnosis Date  . Hypertension   . GERD (gastroesophageal reflux disease)   . Alcohol abuse   . 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 Low sodium   . Persistent atrial fibrillation 04/11/14    chads2vasc score is 2  . Hypotension   . Atrial fibrillation with RVR   . HCAP (healthcare-associated pneumonia)   . Adrenal hemorrhage   . BPH (benign prostatic hyperplasia)   . Hyponatremia   . Anemia of chronic disease   . Fall   . Alcohol intoxication   . Pulmonary nodules   . Adrenal mass   . Atrial flutter by electrocardiogram   . 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 Alcoholism in remission   . Radiation 12/02/14-12/16/14    Left adrenal gland 30 Gy in 10 fractions  . Cancer     small cell/lung  . Neoplastic malignant related fatigue     ALLERGIES:  is allergic to aspirin.  MEDICATIONS:  Current Outpatient Prescriptions  Medication Sig Dispense Refill  . ALPRAZolam (XANAX) 0.25 MG tablet Take 0.25 mg by mouth 3 (three) times daily as needed for anxiety or sleep.    [name] amoxicillin-clavulanate (AUGMENTIN) 875-125 MG per tablet Take 1 tablet by mouth every 12 (twelve) hours. (Patient not taking: Reported on 06/24/2015) 8 tablet 0  . apixaban (ELIQUIS) 5 MG TABS tablet Take 1 tablet (5 mg total) by mouth 2 (two) times daily. 180 tablet 0  . Ascorbic Acid (VITAMIN C PO) Take 1,000 mg by mouth daily.     . B Complex-C (SUPER  B COMPLEX PO) Take 1 each by mouth daily.     . Biotin 7500 MCG TABS Take 1 tablet by mouth daily.     . calcium carbonate (OS-CAL) 600 MG TABS Take 600 mg by mouth daily with breakfast.    . demeclocycline (DECLOMYCIN) 150 MG tablet Take 150 mg by mouth 2 (two) times daily.    [name] diltiazem (CARDIZEM CD) 180 MG 24 hr capsule Take 1 capsule (180 mg total) by mouth daily at 12 noon. 90 capsule 3  . furosemide  (LASIX) 40 MG tablet Take 1 tablet (40 mg total) by mouth daily. 90 tablet 3  . HYDROcodone-acetaminophen (NORCO/VICODIN) 5-325 MG per tablet Take 1-2 tablets by mouth every 4 (four) hours as needed for moderate pain. 30 tablet 0  . KLOR-CON M20 20 MEQ tablet Take 20 mEq by mouth daily.     . Lactobacillus (ACIDOPHILUS) CAPS capsule Take 1 capsule by mouth daily.    [name] MAGNESIUM PO Take 400 mg by mouth daily.     . metoprolol tartrate (LOPRESSOR) 25 MG tablet Take 0.5 tablets (12.5 mg total) by mouth 2 (two) times daily. (Patient taking differently: Take 6.25 mg by mouth 2 (two) times daily. ) 180 tablet 1  . mirtazapine (REMERON) 15 MG tablet Take 1 tablet (15 mg total) by mouth at bedtime. (Patient taking differently: Take 15 mg by mouth at bedtime as needed (sleep). ) 30 tablet 0  . Multiple Vitamin (MULTIVITAMIN WITH MINERALS) TABS Take 1 tablet by mouth daily.    . Nivolumab (OPDIVO IV) Inject as directed every 2 weeks    . Nutritional Supplements (ENSURE COMPLETE PO) Take 1 Can by mouth 2 (two) times daily.    . Omega-3 Fatty Acids (FISH OIL PO) Take 1 tablet by mouth daily.    . ondansetron (ZOFRAN ODT) 8 MG disintegrating tablet Take 1 tablet (8 mg total) by mouth every 8 (eight) hours as needed for nausea. 20 tablet 0  . pantoprazole (PROTONIX) 40 MG tablet Take 40 mg by mouth every morning.  8  . saccharomyces boulardii (FLORASTOR) 250 MG capsule Take 1 capsule (250 mg total) by mouth 2 (two) times daily. 30 capsule 0  . sucralfate (CARAFATE) 1 G tablet Take 1 tablet (1 g total) by mouth 4 (four) times daily -  with meals and at bedtime. 30 tablet 0  . tamsulosin (FLOMAX) 0.4 MG CAPS capsule Take 0.4 mg by mouth daily.     [name] thiamine (VITAMIN B-1) 100 MG tablet Take 1 tablet (100 mg total) by mouth daily. 30 tablet 6  . vitamin E 400 UNIT capsule Take 400 Units by mouth daily.     No current facility-administered medications for this visit.    SURGICAL HISTORY:  Past Surgical History    Procedure Laterality Date  . Hemorrhoid surgery    . Ankle arthrodesis  1995    rt fx-hardware in  . Tonsillectomy    . Colonoscopy    . Excision / curettage bone cyst finger  2005    rt thumb  . Total shoulder arthroplasty  02/03/2012    Procedure: TOTAL SHOULDER ARTHROPLASTY;  Surgeon: [name], MD;  Location: [facility];  Service: Orthopedics;  Laterality: Left;  . Cardioversion N/A 09/29/2014    Procedure: CARDIOVERSION;  Surgeon: Josue Hector, MD;  Location: [facility];  Service: Cardiovascular;  Laterality: N/A;    REVIEW OF SYSTEMS:  A comprehensive review of systems was negative except for: Constitutional: positive  for fatigue   PHYSICAL EXAMINATION: General appearance: alert, cooperative, fatigued and no distress Head: Normocephalic, without obvious abnormality, atraumatic Neck: no adenopathy, no JVD, supple, symmetrical, trachea midline and thyroid not enlarged, symmetric, no tenderness/mass/nodules Lymph nodes: Cervical, supraclavicular, and axillary nodes normal. Resp: clear to auscultation bilaterally Back: symmetric, no curvature. ROM normal. No CVA tenderness. Cardio: regular rate and rhythm, S1, S2 normal, no murmur, click, rub or gallop GI: Right upper quadrant tenderness to palpation Extremities: extremities normal, atraumatic, no cyanosis or edema Neurologic: Alert and oriented X 3, normal strength and tone. Normal symmetric reflexes. Normal coordination and gait  ECOG PERFORMANCE STATUS: 1 - Symptomatic but completely ambulatory  Blood pressure 139/61, pulse 61, temperature 97.6 F (36.4 C), temperature source Oral, resp. rate 18, height _0  (1.727 m), weight 155 lb 9.6 oz (70.58 kg), SpO2 99 %.  LABORATORY DATA: Lab Results  Component Value Date   WBC 4.6 07/08/2015   HGB 14.6 07/08/2015   HCT 41.9 07/08/2015   MCV 93.9 07/08/2015   PLT 236 07/08/2015      Chemistry      Component Value Date/Time   NA 133* 07/08/2015 1316    NA 129* 06/15/2015 0519   K 4.0 07/08/2015 1316   K 4.1 06/15/2015 0519   CL 92* 06/15/2015 0519   CO2 27 07/08/2015 1316   CO2 29 06/15/2015 0519   BUN 12.8 07/08/2015 1316   BUN 10 06/15/2015 0519   CREATININE 0.7 07/08/2015 1316   CREATININE 0.95 06/15/2015 0519      Component Value Date/Time   CALCIUM 9.3 07/08/2015 1316   CALCIUM 8.9 06/15/2015 0519   ALKPHOS 100 07/08/2015 1316   ALKPHOS 68 06/13/2015 0530   AST 23 07/08/2015 1316   AST 20 06/13/2015 0530   ALT 20 07/08/2015 1316   ALT 16* 06/13/2015 0530   BILITOT 0.60 07/08/2015 1316   BILITOT 0.7 06/13/2015 0530       RADIOGRAPHIC STUDIES: Ct Chest W Contrast  06/09/2015   CLINICAL DATA:  Subsequent evaluation of the a 74 year old male with history of lung cancer diagnosed in November 2015. Right-sided chest pain, and pain during deep inspiration for 1 week. Chemotherapy and radiation therapy now complete. Oral chemotherapy ongoing. Prior smoker.  EXAM: CT CHEST, ABDOMEN, AND PELVIS WITH CONTRAST  TECHNIQUE: Multidetector CT imaging of the chest, abdomen and pelvis was performed following the standard protocol during bolus administration of intravenous contrast.  CONTRAST:  142m OMNIPAQUE IOHEXOL 300 MG/ML  SOLN  COMPARISON:  Chest CT 06/01/2015. CT of the chest, abdomen and pelvis 04/10/2015.  FINDINGS: CT CHEST FINDINGS  Mediastinum/Lymph Nodes: Heart size is normal. Small amount of anterior pericardial fluid and/or thickening, similar to prior examinations, and the unlikely to be of any hemodynamic significance at this time. No associated pericardial calcification. There is atherosclerosis of the thoracic aorta, the great vessels of the mediastinum and the coronary arteries, including calcified atherosclerotic plaque in the left main, left anterior descending and right coronary arteries. No pathologically enlarged mediastinal or hilar lymph nodes. Esophagus is unremarkable in appearance. No axillary lymphadenopathy.   Lungs/Pleura: Previously noted nodule in the medial aspect of the apex of the left upper lobe (image 9 of series 4) is stable to slightly decreased in size compared to the prior examination, currently measuring 13 x 7 mm. The other previously described 8 mm spiculated nodule in the left upper lobe is now a very ill-defined area of ground-glass attenuation (image 19 of series 4). Several other  scattered areas of ground-glass attenuation are also noted in the upper lobes of the lungs bilaterally, which appear similar to the prior examination. Linear scarring in the left lower lobe is unchanged. New linear opacities and architectural distortion in the right lower lobe may reflect new areas of developing scarring, or may simply reflect subsegmental atelectasis. Mild diffuse bronchial wall thickening with mild centrilobular and paraseptal emphysema. No acute consolidative airspace disease. No pleural effusions.  Musculoskeletal/Soft Tissues: Asymmetric right-sided gynecomastia again noted, increased in size measuring up to 3.2 x 1.3 cm on today's examination (previously 2.5 x 1.2 cm on prior study 11/10/2014), and completely new compared to remote prior PET-CT 08/27/2013. Multiple old healed right-sided rib fractures. Status post left shoulder hemiarthroplasty. Chronic posttraumatic degenerative changes in the right shoulder redemonstrated. There are no aggressive appearing lytic or blastic lesions noted in the visualized portions of the skeleton.  CT ABDOMEN AND PELVIS FINDINGS  Hepatobiliary: Several tiny sub cm low-attenuation liver lesions and sub cm hypervascular lesions are unchanged in size, number and distribution in the liver, likely to reflect a combination of small cysts and flash fill cavernous hemangiomas. No new aggressive appearing liver lesions are noted. No intra or extrahepatic biliary ductal dilatation. However, there does appear to be a 4 mm partially calcified stone in the distal common bile duct  (image 71 of series 2). There are several partially calcified gallstones lying dependently in the gallbladder, and the gallbladder now appears moderately distended, with a thickened wall which measures up to 9 mm, with small amount of pericholecystic fluid and extensive pericholecystic stranding, strongly suggestive of acute cholecystitis at this time.  Pancreas: No pancreatic mass. No pancreatic ductal dilatation. No pancreatic or peripancreatic fluid or inflammatory changes.  Spleen: Unremarkable.  Adrenals/Urinary Tract: Bilateral perinephric stranding (nonspecific). Multifocal cortical thinning in the kidneys bilaterally, presumably chronic post infectious or inflammatory scarring. Sub cm low-attenuation lesions in the kidneys bilaterally are too small to definitively characterize, but are similar to prior studies, presumably small cysts. Mild bilateral adrenal nodularity, now difficult to accurately measure, but smaller than the prior examinations. No hydroureteronephrosis. Urinary bladder is remarkable for multiple small bladder wall diverticulae.  Stomach/Bowel: Normal appearance of the stomach. No pathologic dilatation of small bowel or colon. A few scattered colonic diverticulae are noted, without surrounding inflammatory changes to suggest an acute diverticulitis at this time.  Vascular/Lymphatic: Extensive atherosclerosis throughout the abdominal and pelvic vasculature, without evidence of aneurysm or dissection.  Reproductive: Prostate gland and seminal vesicles are unremarkable in appearance.  Other: Trace volume of ascites.  No pneumoperitoneum.  Musculoskeletal: There are no aggressive appearing lytic or blastic lesions noted in the visualized portions of the skeleton. Extensive multilevel degenerative disc disease and spondylosis throughout the lumbar spine.  IMPRESSION: 1. Findings are highly concerning for acute cholecystitis. This could be confirmed with right upper quadrant ultrasound if  clinically appropriate. Surgical consultation is also suggested. 2. Generally stable (or slightly improved) appearance of multiple pulmonary nodules (most of which are ground-glass attenuation) in the lungs bilaterally, as above. Continued attention on followup studies is recommended to ensure continued stability. 3. Slow progressive enlargement of asymmetric gynecomastia. This could represent a soft tissue metastasis, or primary male breast cancer, and further evaluation with breast ultrasound and/or mammography is suggested if clinically appropriate. 4. Additional incidental findings, as above, generally similar prior examinations. These results will be called to the ordering clinician or representative by the Radiologist Assistant, and communication documented in the PACS or zVision Dashboard.  Electronically Signed   By: Vinnie Langton M.D.   On: 06/09/2015 15:45   Nm Hepatobiliary Liver Func  06/11/2015   CLINICAL DATA:  Right upper quadrant abdominal pain, nausea and vomiting intermittently for the past 2 weeks. Cholelithiasis and gallbladder sludge and possible gallbladder mass at ultrasound today.  EXAM: NUCLEAR MEDICINE HEPATOBILIARY IMAGING  TECHNIQUE: Sequential images of the abdomen were obtained out to 60 minutes following intravenous administration of radiopharmaceutical.  RADIOPHARMACEUTICALS:  5.1 mCi Tc-85m Choletec IV  COMPARISON:  Abdomen ultrasound obtained today and abdomen CT dated 06/09/2015.  FINDINGS: Normal visualization of the liver, bile ducts and small bowel. The gallbladder was not visualized 1 hour after injection. Therefore, the patient was given 2.8 mg of morphine and an additional 1 mCi of technetium [id number] 1 hour after injection. This resulted in faint visualization of the gallbladder.  IMPRESSION: Delayed visualization of the gallbladder, only seen after administration of 2.8 mg of morphine. No cystic duct obstruction.   Electronically Signed   By: StClaudie Revering.D.    On: 06/11/2015 17:50   UsKoreabdomen Complete  06/11/2015   CLINICAL DATA:  Abnormal gallbladder on CT  EXAM: ULTRASOUND ABDOMEN COMPLETE  COMPARISON:  CT abdomen pelvis 06/09/2015  FINDINGS: Gallbladder: Distended gallbladder containing gallstones and extensive sludge. Gallbladder wall is thickened at 5.4 mm. Negative sonographic Murphy sign.  There is focal soft tissue thickening of the lateral wall of the gallbladder measuring 14 mm in thickness raising the possibility of a mass lesion. This is difficult difficult to confirm on the CT scan.  Common bile duct: Diameter: 3.6 mm  Liver: 9 mm cyst.  No other liver mass lesion.  IVC: No abnormality visualized.  Pancreas: Not visualized  Spleen: Size and appearance within normal limits.  Right Kidney: Length: 10.7 cm. Echogenicity within normal limits. No mass or hydronephrosis visualized.  Left Kidney: Length: 10.9 cm. Echogenicity within normal limits. No mass or hydronephrosis visualized.  Abdominal aorta: No aneurysm visualized.  Other findings: None.  IMPRESSION: Distended gallbladder containing gallstones and sludge. Gallbladder wall thickening. The patient is nontender over the gallbladder. Possible acute cholecystitis. The patient currently has a normal white blood count. There is a possible mass the lateral gallbladder which could be due to carcinoma versus sludge or inflammation.   Electronically Signed   By: ChFranchot Gallo.D.   On: 06/11/2015 12:29   Ct Abdomen Pelvis W Contrast  06/09/2015   CLINICAL DATA:  Subsequent evaluation of the a [age]ear old male with history of lung cancer diagnosed in November 2015. Right-sided chest pain, and pain during deep inspiration for 1 week. Chemotherapy and radiation therapy now complete. Oral chemotherapy ongoing. Prior smoker.  EXAM: CT CHEST, ABDOMEN, AND PELVIS WITH CONTRAST  TECHNIQUE: Multidetector CT imaging of the chest, abdomen and pelvis was performed following the standard protocol during bolus  administration of intravenous contrast.  CONTRAST:  10054mMNIPAQUE IOHEXOL 300 MG/ML  SOLN  COMPARISON:  Chest CT 06/01/2015. CT of the chest, abdomen and pelvis 04/10/2015.  FINDINGS: CT CHEST FINDINGS  Mediastinum/Lymph Nodes: Heart size is normal. Small amount of anterior pericardial fluid and/or thickening, similar to prior examinations, and the unlikely to be of any hemodynamic significance at this time. No associated pericardial calcification. There is atherosclerosis of the thoracic aorta, the great vessels of the mediastinum and the coronary arteries, including calcified atherosclerotic plaque in the left main, left anterior descending and right coronary arteries. No pathologically enlarged mediastinal or hilar lymph nodes. Esophagus is  unremarkable in appearance. No axillary lymphadenopathy.  Lungs/Pleura: Previously noted nodule in the medial aspect of the apex of the left upper lobe (image 9 of series 4) is stable to slightly decreased in size compared to the prior examination, currently measuring 13 x 7 mm. The other previously described 8 mm spiculated nodule in the left upper lobe is now a very ill-defined area of ground-glass attenuation (image 19 of series 4). Several other scattered areas of ground-glass attenuation are also noted in the upper lobes of the lungs bilaterally, which appear similar to the prior examination. Linear scarring in the left lower lobe is unchanged. New linear opacities and architectural distortion in the right lower lobe may reflect new areas of developing scarring, or may simply reflect subsegmental atelectasis. Mild diffuse bronchial wall thickening with mild centrilobular and paraseptal emphysema. No acute consolidative airspace disease. No pleural effusions.  Musculoskeletal/Soft Tissues: Asymmetric right-sided gynecomastia again noted, increased in size measuring up to 3.2 x 1.3 cm on today's examination (previously 2.5 x 1.2 cm on prior study 11/10/2014), and  completely new compared to remote prior PET-CT 08/27/2013. Multiple old healed right-sided rib fractures. Status post left shoulder hemiarthroplasty. Chronic posttraumatic degenerative changes in the right shoulder redemonstrated. There are no aggressive appearing lytic or blastic lesions noted in the visualized portions of the skeleton.  CT ABDOMEN AND PELVIS FINDINGS  Hepatobiliary: Several tiny sub cm low-attenuation liver lesions and sub cm hypervascular lesions are unchanged in size, number and distribution in the liver, likely to reflect a combination of small cysts and flash fill cavernous hemangiomas. No new aggressive appearing liver lesions are noted. No intra or extrahepatic biliary ductal dilatation. However, there does appear to be a 4 mm partially calcified stone in the distal common bile duct (image 71 of series 2). There are several partially calcified gallstones lying dependently in the gallbladder, and the gallbladder now appears moderately distended, with a thickened wall which measures up to 9 mm, with small amount of pericholecystic fluid and extensive pericholecystic stranding, strongly suggestive of acute cholecystitis at this time.  Pancreas: No pancreatic mass. No pancreatic ductal dilatation. No pancreatic or peripancreatic fluid or inflammatory changes.  Spleen: Unremarkable.  Adrenals/Urinary Tract: Bilateral perinephric stranding (nonspecific). Multifocal cortical thinning in the kidneys bilaterally, presumably chronic post infectious or inflammatory scarring. Sub cm low-attenuation lesions in the kidneys bilaterally are too small to definitively characterize, but are similar to prior studies, presumably small cysts. Mild bilateral adrenal nodularity, now difficult to accurately measure, but smaller than the prior examinations. No hydroureteronephrosis. Urinary bladder is remarkable for multiple small bladder wall diverticulae.  Stomach/Bowel: Normal appearance of the stomach. No  pathologic dilatation of small bowel or colon. A few scattered colonic diverticulae are noted, without surrounding inflammatory changes to suggest an acute diverticulitis at this time.  Vascular/Lymphatic: Extensive atherosclerosis throughout the abdominal and pelvic vasculature, without evidence of aneurysm or dissection.  Reproductive: Prostate gland and seminal vesicles are unremarkable in appearance.  Other: Trace volume of ascites.  No pneumoperitoneum.  Musculoskeletal: There are no aggressive appearing lytic or blastic lesions noted in the visualized portions of the skeleton. Extensive multilevel degenerative disc disease and spondylosis throughout the lumbar spine.  IMPRESSION: 1. Findings are highly concerning for acute cholecystitis. This could be confirmed with right upper quadrant ultrasound if clinically appropriate. Surgical consultation is also suggested. 2. Generally stable (or slightly improved) appearance of multiple pulmonary nodules (most of which are ground-glass attenuation) in the lungs bilaterally, as above. Continued attention on followup  studies is recommended to ensure continued stability. 3. Slow progressive enlargement of asymmetric gynecomastia. This could represent a soft tissue metastasis, or primary male breast cancer, and further evaluation with breast ultrasound and/or mammography is suggested if clinically appropriate. 4. Additional incidental findings, as above, generally similar prior examinations. These results will be called to the ordering clinician or representative by the Radiologist Assistant, and communication documented in the PACS or zVision Dashboard.   Electronically Signed   By: Vinnie Langton M.D.   On: 06/09/2015 15:45   Ir Perc Cholecystostomy  06/13/2015   CLINICAL DATA:  Acute cholecystitis  EXAM: PERCUTANEOUS CHOLECYSTOSTOMY TUBE PLACEMENT WITH ULTRASOUND AND FLUOROSCOPIC GUIDANCE:  FLUOROSCOPY TIME:  6 seconds, 1 mGy  TECHNIQUE: The procedure, risks  (including but not limited to bleeding, infection, organ damage ), benefits, and alternatives were explained to the patient. Questions regarding the procedure were encouraged and answered. The patient understands and consents to the procedure. Survey ultrasound of the abdomen was performed and an appropriate skin entry site was identified. Skin site was marked, prepped with Betadine, and draped in usual sterile fashion, and infiltrated locally with 1% lidocaine.  Intravenous Fentanyl and Versed were administered as conscious sedation during continuous cardiorespiratory monitoring by the radiology RN, with a total moderate sedation time of 8 minutes.  Under real-time ultrasound guidance, gallbladder was accessed using a subcostal approach with a 19 gauge needle. Ultrasound image documentation was saved. Bile returned through the hub. Needle was exchanged over a guidewire for transitional dilator which allowed placement of 035 J wire. Over this, a 10.2 French pigtail catheter was advanced and formed centrally in the gallbladder lumen. 20 mL of purulent bile were aspirated, sent for culture. Small contrast injection confirmed appropriate position on fluoroscopy. Catheter secured externally with 0 Prolene suture and placed external drain bag. Patient tolerated the procedure well.  COMPLICATIONS: COMPLICATIONS none  IMPRESSION: 1. Technically successful percutaneous cholecystostomy tube placement with ultrasound and fluoroscopic guidance.   Electronically Signed   By: Lucrezia Europe M.D.   On: 06/13/2015 11:25   ASSESSMENT AND PLAN: This is a very pleasant 74 years old white male with:  1)  Stage IV non-small cell lung cancer, adenocarcinoma status post induction chemotherapy with carboplatin and Alimta and currently on maintenance treatment with single agent Alimta status post 8 cycles. He has been observation for the last few months.  He had evidence for disease progression on the left adrenal gland and the patient  underwent palliative radiotherapy to the left adrenal gland under the care of Dr. Sondra Come completed on 12/16/2014. He is currently undergoing treatment with immunotherapy with Nivolumab status post 10 cycles and tolerating his treatment fairly well. The patient will proceed with cycle #11 today as scheduled. The patient would come back for follow-up visit in 2 weeks with the start of cycle #12.  2) acute cholecystitis: Currently has biliary drainage and he is scheduled to see Dr. Dalbert Batman next week for consideration of cholecystectomy.  3) Atrial fibrillation: He'll continue his current medication with Cardizem, digoxin and metoprolol in addition to Eliquis. He'll also continue his routine followup visit with his cardiologist Dr. [name].  He was advised to call immediately if he has any concerning symptoms in the interval. The patient voices understanding of current disease status and treatment options and is in agreement with the current care plan.  All questions were answered. The patient knows to call the clinic with any problems, questions or concerns. We can certainly see the patient much sooner  if necessary.  Disclaimer: This note was dictated with voice recognition software. Similar sounding words can inadvertently be transcribed and may not be corrected upon review.

## 2015-07-08 NOTE — Patient Instructions (Signed)
[facility] Discharge Instructions for Patients Receiving Chemotherapy  Today you received the following: Nivolumab   To help prevent nausea and vomiting after your treatment, we encourage you to take your nausea medication as directed.   If you develop nausea and vomiting that is not controlled by your nausea medication, call the clinic.   BELOW ARE SYMPTOMS THAT SHOULD BE REPORTED IMMEDIATELY:  *FEVER GREATER THAN 100.5 F  *CHILLS WITH OR WITHOUT FEVER  NAUSEA AND VOMITING THAT IS NOT CONTROLLED WITH YOUR NAUSEA MEDICATION  *UNUSUAL SHORTNESS OF BREATH  *UNUSUAL BRUISING OR BLEEDING  TENDERNESS IN MOUTH AND THROAT WITH OR WITHOUT PRESENCE OF ULCERS  *URINARY PROBLEMS  *BOWEL PROBLEMS  UNUSUAL RASH Items with * indicate a potential emergency and should be followed up as soon as possible.  Feel free to call the clinic you have any questions or concerns. The clinic phone number is [phone].  Please show the CHEMO ALERT CARD at check-in to the Emergency Department and triage nurse.   

## 2015-07-13 ENCOUNTER — Other Ambulatory Visit: Payer: Self-pay | Admitting: General Surgery

## 2015-07-13 DIAGNOSIS — I4891 Unspecified atrial fibrillation: Secondary | ICD-10-CM | POA: Diagnosis not present

## 2015-07-13 DIAGNOSIS — K810 Acute cholecystitis: Secondary | ICD-10-CM

## 2015-07-13 DIAGNOSIS — N400 Enlarged prostate without lower urinary tract symptoms: Secondary | ICD-10-CM | POA: Diagnosis not present

## 2015-07-13 DIAGNOSIS — I10 Essential (primary) hypertension: Secondary | ICD-10-CM | POA: Diagnosis not present

## 2015-07-13 DIAGNOSIS — C3491 Malignant neoplasm of unspecified part of right bronchus or lung: Secondary | ICD-10-CM | POA: Diagnosis not present

## 2015-07-13 DIAGNOSIS — E871 Hypo-osmolality and hyponatremia: Secondary | ICD-10-CM | POA: Diagnosis not present

## 2015-07-14 ENCOUNTER — Other Ambulatory Visit: Payer: Self-pay | Admitting: General Surgery

## 2015-07-14 DIAGNOSIS — K810 Acute cholecystitis: Secondary | ICD-10-CM

## 2015-07-15 ENCOUNTER — Other Ambulatory Visit (HOSPITAL_COMMUNITY): Payer: Self-pay | Admitting: Interventional Radiology

## 2015-07-15 DIAGNOSIS — K810 Acute cholecystitis: Secondary | ICD-10-CM

## 2015-07-17 DIAGNOSIS — I4891 Unspecified atrial fibrillation: Secondary | ICD-10-CM | POA: Diagnosis not present

## 2015-07-17 DIAGNOSIS — I10 Essential (primary) hypertension: Secondary | ICD-10-CM | POA: Diagnosis not present

## 2015-07-17 DIAGNOSIS — K219 Gastro-esophageal reflux disease without esophagitis: Secondary | ICD-10-CM | POA: Diagnosis not present

## 2015-07-17 DIAGNOSIS — Z85118 Personal history of other malignant neoplasm of bronchus and lung: Secondary | ICD-10-CM | POA: Diagnosis not present

## 2015-07-17 DIAGNOSIS — K810 Acute cholecystitis: Secondary | ICD-10-CM | POA: Diagnosis not present

## 2015-07-17 DIAGNOSIS — Z96612 Presence of left artificial shoulder joint: Secondary | ICD-10-CM | POA: Diagnosis not present

## 2015-07-20 ENCOUNTER — Ambulatory Visit (HOSPITAL_COMMUNITY)
Admission: RE | Admit: 2015-07-20 | Discharge: 2015-07-20 | Disposition: A | Discharge status: Home / Self Care | Payer: Medicare Other | Source: Ambulatory Visit | Attending: Nurse Practitioner | Admitting: Nurse Practitioner

## 2015-07-20 ENCOUNTER — Encounter (HOSPITAL_COMMUNITY): Payer: Self-pay | Admitting: Nurse Practitioner

## 2015-07-20 VITALS — BP 140/80 | HR 78 | Ht 72.0 in | Wt 155.2 lb

## 2015-07-20 DIAGNOSIS — K829 Disease of gallbladder, unspecified: Secondary | ICD-10-CM | POA: Insufficient documentation

## 2015-07-20 DIAGNOSIS — M19012 Primary osteoarthritis, left shoulder: Secondary | ICD-10-CM | POA: Insufficient documentation

## 2015-07-20 DIAGNOSIS — E279 Disorder of adrenal gland, unspecified: Secondary | ICD-10-CM | POA: Diagnosis not present

## 2015-07-20 DIAGNOSIS — K219 Gastro-esophageal reflux disease without esophagitis: Secondary | ICD-10-CM | POA: Diagnosis not present

## 2015-07-20 DIAGNOSIS — Z79899 Other long term (current) drug therapy: Secondary | ICD-10-CM | POA: Insufficient documentation

## 2015-07-20 DIAGNOSIS — R42 Dizziness and giddiness: Secondary | ICD-10-CM | POA: Insufficient documentation

## 2015-07-20 DIAGNOSIS — Z7902 Long term (current) use of antithrombotics/antiplatelets: Secondary | ICD-10-CM | POA: Insufficient documentation

## 2015-07-20 DIAGNOSIS — Z87891 Personal history of nicotine dependence: Secondary | ICD-10-CM | POA: Diagnosis not present

## 2015-07-20 DIAGNOSIS — Z8249 Family history of ischemic heart disease and other diseases of the circulatory system: Secondary | ICD-10-CM | POA: Insufficient documentation

## 2015-07-20 DIAGNOSIS — I119 Hypertensive heart disease without heart failure: Secondary | ICD-10-CM | POA: Diagnosis not present

## 2015-07-20 DIAGNOSIS — I4891 Unspecified atrial fibrillation: Secondary | ICD-10-CM | POA: Diagnosis present

## 2015-07-20 DIAGNOSIS — C3490 Malignant neoplasm of unspecified part of unspecified bronchus or lung: Secondary | ICD-10-CM | POA: Insufficient documentation

## 2015-07-20 DIAGNOSIS — I480 Paroxysmal atrial fibrillation: Secondary | ICD-10-CM | POA: Insufficient documentation

## 2015-07-20 MED ORDER — FUROSEMIDE 40 MG PO TABS: 20.0000 mg | ORAL_TABLET | Freq: Every day | ORAL | Status: DC

## 2015-07-20 NOTE — Progress Notes (Signed)
Patient ID: Anthony Curry, male   DOB: 24-Dec-1940, 74 y.o.   MRN: [id number]       PCP:  [name], MD  Electrophysiologist: Dr. [name]  The patient presents today for follow up in the afib clinic.  He was diagnosed with atrial fibrillation in May in the setting of treatment for his Stage IV lung CA. Had hospitalization 8/15 for AF with RVR. He had successful cardioversion 09/29/14. He continues with SR on diltiazem and metoprolol.   He has finished chemo/radiation for lung CA but is going thru evaluation for adrenal mass. He was recently hospitalized for possible gallbladder diseas and currently has a drain in place. He may require surgery in the near future.  He is unaware of any Afib and ekg shows nsr. Tolerating NOAC. He is having some dizziness with walking and is using a cane. When he was last seen by Dr. [name], he was suppose to cut lasix in half and cut metoprolol in half. His wife got confuse and he has been taking a full lasix daily and cut back to 1/4 tab of metoprolol a day. Bp sitting 140/80, BP standing 120/60. He did become mildly dizzy with standing.   He is otherwise doing reasonably well.  Today, he denies symptoms of palpitations, chest pain, some  lower extremity edema, dizziness, presyncope, syncope, or neurologic sequela.  The patient feels that he is tolerating medications without difficulties and is otherwise without complaint today.   Past Medical History  Diagnosis Date  . Hypertension   . GERD (gastroesophageal reflux disease)   . Alcohol abuse   . 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 Low sodium   . Persistent atrial fibrillation 04/11/14    chads2vasc score is 2  . Hypotension   . Atrial fibrillation with RVR   . HCAP (healthcare-associated pneumonia)   . Adrenal hemorrhage   . BPH (benign prostatic hyperplasia)   . Hyponatremia   . Anemia of chronic disease   . Fall   . Alcohol intoxication   . Pulmonary nodules   . Adrenal mass   .  Atrial flutter by electrocardiogram   . 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 Alcoholism in remission   . Radiation 12/02/14-12/16/14    Left adrenal gland 30 Gy in 10 fractions  . Cancer     small cell/lung  . Neoplastic malignant related fatigue    Past Surgical History  Procedure Laterality Date  . Hemorrhoid surgery    . Ankle arthrodesis  1995    rt fx-hardware in  . Tonsillectomy    . Colonoscopy    . Excision / curettage bone cyst finger  2005    rt thumb  . Total shoulder arthroplasty  02/03/2012    Procedure: TOTAL SHOULDER ARTHROPLASTY;  Surgeon: [name], MD;  Location: [facility];  Service: Orthopedics;  Laterality: Left;  . Cardioversion N/A 09/29/2014    Procedure: CARDIOVERSION;  Surgeon: Josue Hector, MD;  Location[facility] ENDOSCOPY;  Service: Cardiovascular;  Laterality: N/A;    Current Outpatient Prescriptions  Medication Sig Dispense Refill  . ALPRAZolam (XANAX) 0.25 MG tablet Take 0.25 mg by mouth 3 (three) times daily as needed for anxiety or sleep.    [name] apixaban (ELIQUIS) 5 MG TABS tablet Take 1 tablet (5 mg total) by mouth 2 (two) times daily. 180 tablet 0  . Ascorbic Acid (VITAMIN C PO) Take 1,000 mg by mouth daily.     . B Complex-C (SUPER B COMPLEX  PO) Take 1 each by mouth daily.     . calcium carbonate (OS-CAL) 600 MG TABS Take 600 mg by mouth daily with breakfast.    . demeclocycline (DECLOMYCIN) 150 MG tablet Take 150 mg by mouth 2 (two) times daily.    [name] diltiazem (CARDIZEM CD) 180 MG 24 hr capsule Take 1 capsule (180 mg total) by mouth daily at 12 noon. 90 capsule 3  . furosemide (LASIX) 40 MG tablet Take 0.5 tablets (20 mg total) by mouth daily. 90 tablet 3  . HYDROcodone-acetaminophen (NORCO/VICODIN) 5-325 MG per tablet Take 1-2 tablets by mouth every 4 (four) hours as needed for moderate pain. 30 tablet 0  . KLOR-CON M20 20 MEQ tablet Take 20 mEq by mouth daily.     . Lactobacillus (ACIDOPHILUS) CAPS capsule Take 1  capsule by mouth daily.    [name] MAGNESIUM PO Take 400 mg by mouth daily.     . mirtazapine (REMERON) 15 MG tablet Take 1 tablet (15 mg total) by mouth at bedtime. (Patient taking differently: Take 15 mg by mouth at bedtime as needed (sleep). ) 30 tablet 0  . Multiple Vitamin (MULTIVITAMIN WITH MINERALS) TABS Take 1 tablet by mouth daily.    . Nivolumab (OPDIVO IV) Inject as directed every 2 weeks    . Nutritional Supplements (ENSURE COMPLETE PO) Take 1 Can by mouth 2 (two) times daily.    . Omega-3 Fatty Acids (FISH OIL PO) Take 1 tablet by mouth daily.    . pantoprazole (PROTONIX) 40 MG tablet Take 40 mg by mouth every morning.  8  . tamsulosin (FLOMAX) 0.4 MG CAPS capsule Take 0.4 mg by mouth daily.     [name] thiamine (VITAMIN B-1) 100 MG tablet Take 1 tablet (100 mg total) by mouth daily. 30 tablet 6  . vitamin E 400 UNIT capsule Take 400 Units by mouth daily.    . ondansetron (ZOFRAN ODT) 8 MG disintegrating tablet Take 1 tablet (8 mg total) by mouth every 8 (eight) hours as needed for nausea. (Patient not taking: Reported on 07/20/2015) 20 tablet 0   No current facility-administered medications for this encounter.    Allergies  Allergen Reactions  . Aspirin Other (See Comments)    Pt can not have Asprin due to medical conditions according to spouse    Social History   Social History  . Marital Status: Married    Spouse Name: N/A  . Number of Children: 3  . Years of Education: N/A   Occupational History  . 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 Smoking status: Former Smoker -- 2.00 packs/day for 30 years    Types: Cigarettes    Quit date: 12/05/1990  . Smokeless tobacco: Never Used  . Alcohol Use: No     Comment: does not drink now  . Drug Use: No  . Sexual Activity: Not Currently   Other Topics Concern  . Not on file   Social History Narrative   Retired from [employer] History  Problem Relation Age of Onset  . Brain cancer  Brother   . Lung cancer Mother   . Hypertension Father   . Diabetes Paternal Grandmother   . Heart attack Father   . Heart attack Brother     ROS-  All systems are reviewed and are negative except as outlined in the HPI above  Physical Exam: Filed Vitals:   07/20/15 1135  BP: 140/80  Pulse: 78  Height: 6' (1.829 m)  Weight: 155 lb 3.2 oz (70.398 kg)    GEN- The patient is ill appearing, alert and oriented x 3 today.   Head- normocephalic, atraumatic Eyes-  Sclera clear, conjunctiva pink Ears- hearing intact Oropharynx- clear Neck- supple,   Lungs- decreased BS at the bases, normal work of breathing Heart- tachycardic irregular rhythm GI- soft, NT, ND, + BS Extremities- no clubbing, cyanosis, 1t edemal rt, trace left MS- no significant deformity or atrophy Skin- no rash or lesion Psych- euthymic mood, full affect Neuro- strength and sensation are intact  ekg today reveals SR with first degree av block, PR 292m,QRS 72 ms, QTc 4226m  Epic records reviewed.   Assessment and Plan:  1. PAF   Staying in SR Coninue NOAC Continue diltazem  2. Stage IV lung CA Stable  3. Adrenal Mass Being treated  3. Hypertensive cardiovascular disease Stable No change required today  4. Dizziness Stop metoprolol  Decrease lasix to 20 mg a day  5. Gallbladder disease F/u as planned If surgery required will need to stop DOAC for at least 2 days before surgery and resume after surgery when deemed safe from a bleeding standpoint.   F/u in 10 days for effects of above med changes   DoButch Penny. Carroll, ANRichlawn Hospital[address] StreetrHoliday HeightsNC [phone]

## 2015-07-20 NOTE — Patient Instructions (Signed)
Your physician has recommended you make the following change in your medication:  1)Stop metoprolol 2)Decrease lasix to '20mg'$  (1/2 tablet) once a day

## 2015-07-21 ENCOUNTER — Ambulatory Visit
Admission: RE | Admit: 2015-07-21 | Discharge: 2015-07-21 | Disposition: A | Discharge status: Home / Self Care | Payer: Medicare Other | Source: Ambulatory Visit | Attending: Interventional Radiology | Admitting: Interventional Radiology

## 2015-07-21 ENCOUNTER — Ambulatory Visit
Admission: RE | Admit: 2015-07-21 | Discharge: 2015-07-21 | Disposition: A | Discharge status: Home / Self Care | Payer: Medicare Other | Source: Ambulatory Visit | Attending: General Surgery | Admitting: General Surgery

## 2015-07-21 DIAGNOSIS — K810 Acute cholecystitis: Secondary | ICD-10-CM

## 2015-07-21 DIAGNOSIS — K8021 Calculus of gallbladder without cholecystitis with obstruction: Secondary | ICD-10-CM | POA: Diagnosis not present

## 2015-07-21 NOTE — Progress Notes (Signed)
Chief Complaint: Patient was seen in consultation today for No chief complaint on file.  at the request of McCullough,Heath  Referring Physician(s): McCullough,Heath  History of Present Illness: Anthony Curry is a 74 y.o. 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  He presented to the hospital with acute calculus cholecystitis on 06/11/2015.  Due to his comorbidities and anticoagulation he was deemed a poor surgical candidate in the acute setting and a percutaneous cholecystostomy tube was placed  on 06/13/2015.  At that time, he did express a desire to pursue elective cholecystectomy in the future.  He has done well since his discharge from the hospital. He denies right upper quadrant pain, nausea or vomiting. His percutaneous cholecystostomy tube is draining clear yellow bile. He has no significant pain at the drain site. No complaint of drainage around the catheter tubing.  He has an appointment to follow up with Dr. Dalbert Batman of general surgery on August 30.   Past Medical History  Diagnosis Date  . Hypertension   . GERD (gastroesophageal reflux disease)   . Alcohol abuse   . 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 Low sodium   . Persistent atrial fibrillation 04/11/14    chads2vasc score is 2  . Hypotension   . Atrial fibrillation with RVR   . HCAP (healthcare-associated pneumonia)   . Adrenal hemorrhage   . BPH (benign prostatic hyperplasia)   . Hyponatremia   . Anemia of chronic disease   . Fall   . Alcohol intoxication   . Pulmonary nodules   . Adrenal mass   . Atrial flutter by electrocardiogram   . 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 Alcoholism in remission   . Radiation 12/02/14-12/16/14    Left adrenal gland 30 Gy in 10 fractions  . Cancer     small cell/lung  . Neoplastic malignant related fatigue     Past Surgical History    Procedure Laterality Date  . Hemorrhoid surgery    . Ankle arthrodesis  1995    rt fx-hardware in  . Tonsillectomy    . Colonoscopy    . Excision / curettage bone cyst finger  2005    rt thumb  . Total shoulder arthroplasty  02/03/2012    Procedure: TOTAL SHOULDER ARTHROPLASTY;  Surgeon: [name], MD;  Location: [facility];  Service: Orthopedics;  Laterality: Left;  . Cardioversion N/A 09/29/2014    Procedure: CARDIOVERSION;  Surgeon: Josue Hector, MD;  Location: [facility] ENDOSCOPY;  Service: Cardiovascular;  Laterality: N/A;    Allergies: Aspirin  Medications: Prior to Admission medications   Medication Sig Start Date End Date Taking? Authorizing Provider  ALPRAZolam (XANAX) 0.25 MG tablet Take 0.25 mg by mouth 3 (three) times daily as needed for anxiety or sleep.    Historical Provider, MD  apixaban (ELIQUIS) 5 MG TABS tablet Take 1 tablet (5 mg total) by mouth 2 (two) times daily. 12/12/14   Thompson Grayer, MD  Ascorbic Acid (VITAMIN C PO) Take 1,000 mg by mouth daily.     Historical Provider, MD  B Complex-C (SUPER B COMPLEX PO) Take 1 each by mouth daily.     Historical Provider, MD  calcium carbonate (OS-CAL) 600 MG TABS Take 600 mg by mouth daily with breakfast.    Historical Provider, MD  demeclocycline (DECLOMYCIN) 150 MG tablet Take 150 mg by mouth 2 (two) times daily.  Historical Provider, MD  diltiazem (CARDIZEM CD) 180 MG 24 hr capsule Take 1 capsule (180 mg total) by mouth daily at 12 noon. 01/09/15   Sherran Needs, NP  furosemide (LASIX) 40 MG tablet Take 0.5 tablets (20 mg total) by mouth daily. 07/20/15   Sherran Needs, NP  HYDROcodone-acetaminophen (NORCO/VICODIN) 5-325 MG per tablet Take 1-2 tablets by mouth every 4 (four) hours as needed for moderate pain. 06/15/15   Emina Riebock, NP  KLOR-CON M20 20 MEQ tablet Take 20 mEq by mouth daily.  10/18/14   Historical Provider, MD  Lactobacillus (ACIDOPHILUS) CAPS capsule Take 1 capsule by mouth daily.     Historical Provider, MD  MAGNESIUM PO Take 400 mg by mouth daily.     Historical Provider, MD  mirtazapine (REMERON) 15 MG tablet Take 1 tablet (15 mg total) by mouth at bedtime. Patient taking differently: Take 15 mg by mouth at bedtime as needed (sleep).  01/19/15   [name], MD  Multiple Vitamin (MULTIVITAMIN WITH MINERALS) TABS Take 1 tablet by mouth daily.    Historical Provider, MD  Nivolumab (OPDIVO IV) Inject as directed every 2 weeks    Historical Provider, MD  Nutritional Supplements (ENSURE COMPLETE PO) Take 1 Can by mouth 2 (two) times daily.    Historical Provider, MD  Omega-3 Fatty Acids (FISH OIL PO) Take 1 tablet by mouth daily.    Historical Provider, MD  ondansetron (ZOFRAN ODT) 8 MG disintegrating tablet Take 1 tablet (8 mg total) by mouth every 8 (eight) hours as needed for nausea. Patient not taking: Reported on 07/20/2015 06/01/15   Varney Biles, MD  pantoprazole (PROTONIX) 40 MG tablet Take 40 mg by mouth every morning. 06/02/15   Historical Provider, MD  tamsulosin (FLOMAX) 0.4 MG CAPS capsule Take 0.4 mg by mouth daily.     Historical Provider, MD  thiamine (VITAMIN B-1) 100 MG tablet Take 1 tablet (100 mg total) by mouth daily. 09/11/13   Erick Colace, NP  vitamin E 400 UNIT capsule Take 400 Units by mouth daily.    Historical Provider, MD     Family History  Problem Relation Age of Onset  . Brain cancer Brother   . Lung cancer Mother   . Hypertension Father   . Diabetes Paternal Grandmother   . Heart attack Father   . Heart attack Brother     Social History   Social History  . Marital Status: Married    Spouse Name: N/A  . Number of Children: 3  . Years of Education: N/A   Occupational History  . 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 Smoking status: Former Smoker -- 2.00 packs/day for 30 years    Types: Cigarettes    Quit date: 12/05/1990  . Smokeless tobacco: Never Used  . Alcohol Use: No     Comment:  does not drink now  . Drug Use: No  . Sexual Activity: Not Currently   Other Topics Concern  . Not on file   Social History Narrative   Retired from [employer]: A 12 point ROS discussed and pertinent positives are indicated in the HPI above.  All other systems are negative.  Review of Systems  Vital Signs: There were no vitals taken for this visit.  Physical Exam  Constitutional: He is oriented to person, place, and time. He appears well-developed and well-nourished. No distress.  Abdominal:  RUQ drain site is non tender and there is no evidence of cellulitis or leakage.   Neurological: He is alert and oriented to person, place, and time.  Skin: Skin is warm and dry.  Psychiatric: He has a normal mood and affect. His behavior is normal.  Nursing note and vitals reviewed.     Imaging: No results found.  Labs:  CBC:  Recent Labs  06/12/15 2305 06/15/15 0519 06/24/15 1055 07/08/15 1316  WBC 4.3 6.4 7.2 4.6  HGB 12.0* 13.5 13.6 14.6  HCT 33.7* 38.5* 39.7 41.9  PLT 363 402* 328 236    COAGS:  Recent Labs  09/01/14 1148 06/11/15 1905 06/12/15 0519 06/12/15 0545 06/12/15 1246 06/12/15 2300  INR 1.1*  --  1.26  --   --   --   APTT  --  45*  --  70* 104* 66*    BMP:  Recent Labs  06/11/15 1132 06/12/15 0519 06/13/15 0530 06/15/15 0519 06/24/15 1055 07/08/15 1316  NA 128* 129* 128* 129* 131* 133*  K 3.5 3.4* 3.9 4.1 3.8 4.0  CL 88* 94* 95* 92*  --   --   CO2 '30 24 25 29 23 27  '$ GLUCOSE 108* 77 86 101* 137 87  BUN '12 12 8 10 '$ 11.6 12.8  CALCIUM 8.7* 8.6* 8.4* 8.9 9.4 9.3  CREATININE 0.79 0.78 0.80 0.95 0.8 0.7  GFRNONAA >60 >60 >60 >60  --   --   GFRAA >60 >60 >60 >60  --   --     LIVER FUNCTION TESTS:  Recent Labs  06/12/15 0519 06/13/15 0530 06/24/15 1055 07/08/15 1316  BILITOT 1.1 0.7 0.57 0.60  AST '23 20 24 23  '$ ALT 19 16* 21 20  ALKPHOS 81 68 96 100  PROT 6.1* 5.7* 6.0* 6.5  ALBUMIN 3.4* 3.2* 3.5 4.0     TUMOR MARKERS: No results for input(s): AFPTM, CEA, CA199, CHROMGRNA in the last 8760 hours.  Assessment and Plan:  History of acute calculus cholecystitis requiring percutaneous cholecystostomy tube placement.  Cholangiogram today demonstrates cholelithiasis and choledocholithiasis with non-obstructing stones in the cystic duct and distal common bile duct. Although passage of contrast is slow, contrast does make it through the ampulla and into the duodenum.  1.) Given stones in both the cystic and distal common bile duct, this gentleman is at high risk for recurrent acute cholecystitis if his biliary tube were to be removed.    2.)  Tube was capped today for patient comfort. He was issued a new bag and given detailed instructions that he should reconnect his tube to gravity bag drainage at the first sign of right upper quadrant pain, discomfort, nausea, vomiting or malaise.   3.) Follow-up appointment with Dr. Dalbert Batman of general surgery scheduled for August 30. If this gentleman is now a candidate for elective laparoscopic cholecystectomy, consider ERCP during the same hospital stay to treat his choledocholithiasis.  4.) If patient unable to tolerate surgery and prolonged percutaneous cholecystostomy tube drainage is required, please schedule for biliary tube check and change in interventional radiology to be performed in early September.    SignedJacqulynn Cadet 07/21/2015, 1:22 PM   I spent a total of 15 Minutes in face to face in clinical consultation, greater than 50% of which was counseling/coordinating care for percutaneous cholecystostomy tube.

## 2015-07-22 ENCOUNTER — Ambulatory Visit (HOSPITAL_BASED_OUTPATIENT_CLINIC_OR_DEPARTMENT_OTHER): Payer: Medicare Other

## 2015-07-22 ENCOUNTER — Encounter: Payer: Self-pay | Admitting: Physician Assistant

## 2015-07-22 ENCOUNTER — Ambulatory Visit (HOSPITAL_BASED_OUTPATIENT_CLINIC_OR_DEPARTMENT_OTHER): Payer: Medicare Other | Admitting: Physician Assistant

## 2015-07-22 ENCOUNTER — Other Ambulatory Visit (HOSPITAL_BASED_OUTPATIENT_CLINIC_OR_DEPARTMENT_OTHER): Payer: Medicare Other

## 2015-07-22 VITALS — BP 145/66 | HR 88 | Temp 97.8°F | Resp 18 | Ht 72.0 in | Wt 154.6 lb

## 2015-07-22 DIAGNOSIS — C3410 Malignant neoplasm of upper lobe, unspecified bronchus or lung: Secondary | ICD-10-CM

## 2015-07-22 DIAGNOSIS — R5383 Other fatigue: Secondary | ICD-10-CM

## 2015-07-22 DIAGNOSIS — C7972 Secondary malignant neoplasm of left adrenal gland: Secondary | ICD-10-CM | POA: Diagnosis not present

## 2015-07-22 DIAGNOSIS — Z5112 Encounter for antineoplastic immunotherapy: Secondary | ICD-10-CM | POA: Diagnosis present

## 2015-07-22 DIAGNOSIS — C3431 Malignant neoplasm of lower lobe, right bronchus or lung: Secondary | ICD-10-CM

## 2015-07-22 DIAGNOSIS — Z79899 Other long term (current) drug therapy: Secondary | ICD-10-CM | POA: Diagnosis not present

## 2015-07-22 DIAGNOSIS — I4891 Unspecified atrial fibrillation: Secondary | ICD-10-CM

## 2015-07-22 DIAGNOSIS — C3492 Malignant neoplasm of unspecified part of left bronchus or lung: Secondary | ICD-10-CM

## 2015-07-22 DIAGNOSIS — R530 Neoplastic (malignant) related fatigue: Secondary | ICD-10-CM

## 2015-07-22 DIAGNOSIS — K810 Acute cholecystitis: Secondary | ICD-10-CM

## 2015-07-22 LAB — CBC WITH DIFFERENTIAL/PLATELET
BASO%: 0.5 % (ref 0.0–2.0)
Basophils Absolute: 0 10*3/uL (ref 0.0–0.1)
EOS ABS: 0.3 10*3/uL (ref 0.0–0.5)
EOS%: 7.1 % — ABNORMAL HIGH (ref 0.0–7.0)
HCT: 39.9 % (ref 38.4–49.9)
HGB: 14 g/dL (ref 13.0–17.1)
LYMPH%: 32.8 % (ref 14.0–49.0)
MCH: 32.2 pg (ref 27.2–33.4)
MCHC: 35.1 g/dL (ref 32.0–36.0)
MCV: 91.7 fL (ref 79.3–98.0)
MONO#: 0.5 10*3/uL (ref 0.1–0.9)
MONO%: 11.6 % (ref 0.0–14.0)
NEUT#: 2 10*3/uL (ref 1.5–6.5)
NEUT%: 48 % (ref 39.0–75.0)
PLATELETS: 212 10*3/uL (ref 140–400)
RBC: 4.35 10*6/uL (ref 4.20–5.82)
RDW: 14.4 % (ref 11.0–14.6)
WBC: 4.2 10*3/uL (ref 4.0–10.3)
lymph#: 1.4 10*3/uL (ref 0.9–3.3)

## 2015-07-22 LAB — COMPREHENSIVE METABOLIC PANEL (CC13)
ALT: 17 U/L (ref 0–55)
AST: 23 U/L (ref 5–34)
Albumin: 3.9 g/dL (ref 3.5–5.0)
Alkaline Phosphatase: 93 U/L (ref 40–150)
Anion Gap: 8 mEq/L (ref 3–11)
BUN: 12.6 mg/dL (ref 7.0–26.0)
CHLORIDE: 101 meq/L (ref 98–109)
CO2: 24 meq/L (ref 22–29)
Calcium: 9.5 mg/dL (ref 8.4–10.4)
Creatinine: 0.8 mg/dL (ref 0.7–1.3)
EGFR: 88 mL/min/{1.73_m2} — AB (ref >90)
Glucose: 93 mg/dl (ref 70–140)
Potassium: 3.9 mEq/L (ref 3.5–5.1)
Sodium: 133 mEq/L — ABNORMAL LOW (ref 136–145)
Total Bilirubin: 0.72 mg/dL (ref 0.20–1.20)
Total Protein: 6.3 g/dL — ABNORMAL LOW (ref 6.4–8.3)

## 2015-07-22 LAB — TSH CHCC: TSH: 3.308 m(IU)/L (ref 0.320–4.118)

## 2015-07-22 MED ORDER — NIVOLUMAB CHEMO INJECTION 100 MG/10ML
3.0000 mg/kg | Freq: Once | INTRAVENOUS | Status: AC
  Administered 2015-07-22: 220 mg via INTRAVENOUS
  Filled 2015-07-22: qty 22

## 2015-07-22 MED ORDER — SODIUM CHLORIDE 0.9 % IV SOLN
Freq: Once | INTRAVENOUS | Status: AC
  Administered 2015-07-22: 13:00:00 via INTRAVENOUS

## 2015-07-22 NOTE — Progress Notes (Addendum)
[place] [phone]   [phone] [place], [place] [address]  DIAGNOSIS: Stage IV (T1 A., N0, M1 B.) non-small cell lung cancer, adenocarcinoma with negative EGFR mutation and negative ALK gene translocation diagnosed in October of 2014.   Molecular profile: Negative for RET, ALK, BRAF, MET, EGFR.  Positive for ERBB2 A775T, HYQ6V784O, KRAS G13E, MAP2K1 D67N (see full report).   PRIOR THERAPY:  1) Systemic chemotherapy with carboplatin for AUC of 5 and Alimta 500 mg/M2 every 3 weeks, status post 6 cycles with stable disease. First dose on 10/23/2013. 2) Maintenance systemic chemotherapy with single agent Alimta 500 mg/M2 every 3 weeks. Status post 8 cycles.   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 Sondra Come completed on 12/16/2014.  CURRENT THERAPY: Immunotherapy with Nivolumab 3 MG/KG every 2 weeks, status post 11 cycles. First cycle 01/28/2015  CHEMOTHERAPY INTENT: Palliative  CURRENT # OF CHEMOTHERAPY CYCLES: 12 CURRENT ANTIEMETICS: Zofran, Compazine and dexamethasone  CURRENT SMOKING STATUS: Former smoker  ORAL CHEMOTHERAPY AND CONSENT: None  CURRENT BISPHOSPHONATES USE: None  PAIN MANAGEMENT: 0/10  NARCOTICS INDUCED CONSTIPATION: None  LIVING WILL AND CODE STATUS: ?   INTERVAL HISTORY: Anthony Curry 74 y.o. male returns to the clinic today for followup visit accompanied by his wife. He is tolerating his current treatment with immunotherapy with Nivolumab fairly well with no significant adverse effects except for mild fatigue. He is status post biliary tube in the care of Dr. Dalbert Batman and is scheduled to see him in follow-up on 08/04/2015.  He denied having any significant chest pain, shortness of breath, cough or hemoptysis. He denied having any significant fever or chills, no nausea or vomiting. He is here today to start  cycle #12 of his immunotherapy.  MEDICAL HISTORY: Past Medical History  Diagnosis Date  . Hypertension   . GERD (gastroesophageal reflux disease)   . Alcohol abuse   . 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 Low sodium   . Persistent atrial fibrillation 04/11/14    chads2vasc score is 2  . Hypotension   . Atrial fibrillation with RVR   . HCAP (healthcare-associated pneumonia)   . Adrenal hemorrhage   . BPH (benign prostatic hyperplasia)   . Hyponatremia   . Anemia of chronic disease   . Fall   . Alcohol intoxication   . Pulmonary nodules   . Adrenal mass   . Atrial flutter by electrocardiogram   . 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 Alcoholism in remission   . Radiation 12/02/14-12/16/14    Left adrenal gland 30 Gy in 10 fractions  . Cancer     small cell/lung  . Neoplastic malignant related fatigue     ALLERGIES:  is allergic to aspirin.  MEDICATIONS:  Current Outpatient Prescriptions  Medication Sig Dispense Refill  . ALPRAZolam (XANAX) 0.25 MG tablet Take 0.25 mg by mouth 3 (three) times daily as needed for anxiety or sleep.    [name] apixaban (ELIQUIS) 5 MG TABS tablet Take 1 tablet (5 mg total) by mouth 2 (two) times daily. 180 tablet 0  . Ascorbic Acid (VITAMIN C PO) Take 1,000 mg by mouth daily.     . B Complex-C (SUPER B COMPLEX PO) Take 1 each by mouth daily.     . calcium carbonate (OS-CAL) 600 MG TABS Take 600 mg by mouth daily with breakfast.    .  demeclocycline (DECLOMYCIN) 150 MG tablet Take 150 mg by mouth 2 (two) times daily.    [name] diltiazem (CARDIZEM CD) 180 MG 24 hr capsule Take 1 capsule (180 mg total) by mouth daily at 12 noon. 90 capsule 3  . furosemide (LASIX) 40 MG tablet Take 0.5 tablets (20 mg total) by mouth daily. 90 tablet 3  . HYDROcodone-acetaminophen (NORCO/VICODIN) 5-325 MG per tablet Take 1-2 tablets by mouth every 4 (four) hours as needed for moderate pain. 30 tablet 0  . KLOR-CON M20 20 MEQ tablet Take 20 mEq by mouth  daily.     . Lactobacillus (ACIDOPHILUS) CAPS capsule Take 1 capsule by mouth daily.    [name] MAGNESIUM PO Take 400 mg by mouth daily.     . mirtazapine (REMERON) 15 MG tablet Take 1 tablet (15 mg total) by mouth at bedtime. (Patient taking differently: Take 15 mg by mouth at bedtime as needed (sleep). ) 30 tablet 0  . Multiple Vitamin (MULTIVITAMIN WITH MINERALS) TABS Take 1 tablet by mouth daily.    . Nivolumab (OPDIVO IV) Inject as directed every 2 weeks    . Nutritional Supplements (ENSURE COMPLETE PO) Take 1 Can by mouth 2 (two) times daily.    . Omega-3 Fatty Acids (FISH OIL PO) Take 1 tablet by mouth daily.    . ondansetron (ZOFRAN ODT) 8 MG disintegrating tablet Take 1 tablet (8 mg total) by mouth every 8 (eight) hours as needed for nausea. (Patient not taking: Reported on 07/20/2015) 20 tablet 0  . pantoprazole (PROTONIX) 40 MG tablet Take 40 mg by mouth every morning.  8  . tamsulosin (FLOMAX) 0.4 MG CAPS capsule Take 0.4 mg by mouth daily.     [name] thiamine (VITAMIN B-1) 100 MG tablet Take 1 tablet (100 mg total) by mouth daily. 30 tablet 6  . vitamin E 400 UNIT capsule Take 400 Units by mouth daily.     No current facility-administered medications for this visit.    SURGICAL HISTORY:  Past Surgical History  Procedure Laterality Date  . Hemorrhoid surgery    . Ankle arthrodesis  1995    rt fx-hardware in  . Tonsillectomy    . Colonoscopy    . Excision / curettage bone cyst finger  2005    rt thumb  . Total shoulder arthroplasty  02/03/2012    Procedure: TOTAL SHOULDER ARTHROPLASTY;  Surgeon: [name], MD;  Location: [facility];  Service: Orthopedics;  Laterality: Left;  . Cardioversion N/A 09/29/2014    Procedure: CARDIOVERSION;  Surgeon: Josue Hector, MD;  Location: [facility] END[facility];  Service: Cardiovascular;  Laterality: N/A;    REVIEW OF SYSTEMS:  A comprehensive review of systems was negative except for: Constitutional: positive for fatigue   PHYSICAL  EXAMINATION: General appearance: alert, cooperative, fatigued and no distress Head: Normocephalic, without obvious abnormality, atraumatic Neck: no adenopathy, no JVD, supple, symmetrical, trachea midline and thyroid not enlarged, symmetric, no tenderness/mass/nodules Lymph nodes: Cervical, supraclavicular, and axillary nodes normal. Resp: clear to auscultation bilaterally Back: symmetric, no curvature. ROM normal. No CVA tenderness. Cardio: regular rate and rhythm, S1, S2 normal, no murmur, click, rub or gallop GI: Right upper quadrant tenderness to palpation, biliary drainage tube in place Extremities: extremities normal, atraumatic, no cyanosis or edema Neurologic: Alert and oriented X 3, normal strength and tone. Normal symmetric reflexes. Normal coordination and gait  ECOG PERFORMANCE STATUS: 1 - Symptomatic but completely ambulatory  Blood pressure 145/66, pulse 88, temperature 97.8 F (36.6 C),  temperature source Oral, resp. rate 18, height 6' (1.829 m), weight 154 lb 9.6 oz (70.126 kg), SpO2 98 %.  LABORATORY DATA: Lab Results  Component Value Date   WBC 4.2 07/22/2015   HGB 14.0 07/22/2015   HCT 39.9 07/22/2015   MCV 91.7 07/22/2015   PLT 212 07/22/2015      Chemistry      Component Value Date/Time   NA 133* 07/22/2015 1055   NA 129* 06/15/2015 0519   K 3.9 07/22/2015 1055   K 4.1 06/15/2015 0519   CL 92* 06/15/2015 0519   CO2 24 07/22/2015 1055   CO2 29 06/15/2015 0519   BUN 12.6 07/22/2015 1055   BUN 10 06/15/2015 0519   CREATININE 0.8 07/22/2015 1055   CREATININE 0.95 06/15/2015 0519      Component Value Date/Time   CALCIUM 9.5 07/22/2015 1055   CALCIUM 8.9 06/15/2015 0519   ALKPHOS 93 07/22/2015 1055   ALKPHOS 68 06/13/2015 0530   AST 23 07/22/2015 1055   AST 20 06/13/2015 0530   ALT 17 07/22/2015 1055   ALT 16* 06/13/2015 0530   BILITOT 0.72 07/22/2015 1055   BILITOT 0.7 06/13/2015 0530       RADIOGRAPHIC STUDIES: Dg Sinus/fist Tube Chk-non  Gi  07/21/2015   CLINICAL DATA:  73 year old male with stage IV non-small cell lung cancer and recent episode of acute calculus cholecystitis treated by percutaneous cholecystostomy tube placement on 06/13/2015. He presents for cholangiogram through existing biliary tube.  EXAM: CHOLANGIOGRAM THROUGH EXISTING ACCESS  CONTRAST:  15 mL Omnipaque 300  FLUOROSCOPY TIME:  Radiation Exposure Index (as provided by the fluoroscopic device): 21 dGy  COMPARISON:  Cholecystostomy tube placement images 7 08/25/2015  FINDINGS: An initial spot image demonstrates the tube in the expected location in the right upper quadrant. Hand injection of contrast material was performed under real-time fluoroscopic imaging. The gallbladder is contracted. After sufficient pressurization, contrast material slowly fills the cystic duct. There is a focal filling defect in the cystic duct consistent with the presence of a small stone. This is incompletely occlusive. Contrast material eventually passes through the remainder the cystic duct and enters the common bile duct.  There is a second filling defect in the distal common bile duct consistent with choledocholithiasis. Again, this is not obstructing as contrast material does pass beyond this stone, through the ampulla and into the duodenum. The drain was capped.  IMPRESSION: 1. Cholelithiasis with an incompletely obstructing stone in the cystic duct. 2. Positive for choledocholithiasis. There is a filling defect in the distal common bile duct which is not occlusive. Contrast material passes around the stone and into the duodenum.   Electronically Signed   By: Jacqulynn Cadet M.D.   On: 07/21/2015 15:46   ASSESSMENT AND PLAN: This is a very pleasant 74 years old white male with:  1)  Stage IV non-small cell lung cancer, adenocarcinoma status post induction chemotherapy with carboplatin and Alimta and currently on maintenance treatment with single agent Alimta status post 8 cycles. He has  been observation for the last few months.  He had evidence for disease progression on the left adrenal gland and the patient underwent palliative radiotherapy to the left adrenal gland under the care of Dr. Sondra Come completed on 12/16/2014. He is currently undergoing treatment with immunotherapy with Nivolumab status post 11 cycles and tolerating his treatment fairly well. Patient was discussed with and also seen by Dr. [name]. The patient will proceed with cycle #12 today as scheduled. The  patient would will follow-up in 2 weeks prior to the start of cycle #13 with a restaging CT scan of the chest, abdomen and pelvis with contrast to reevaluate his disease.   2) acute cholecystitis: Currently has biliary drainage and he is scheduled to see Dr. Dalbert Batman on 08/04/2015 for follow-up  3) Atrial fibrillation: He'll continue his current medication with Cardizem, digoxin and metoprolol in addition to Eliquis. He'll also continue his routine followup visit with his cardiologist Dr. [name].  He was advised to call immediately if he has any concerning symptoms in the interval. The patient voices understanding of current disease status and treatment options and is in agreement with the current care plan.  All questions were answered. The patient knows to call the clinic with any problems, questions or concerns. We can certainly see the patient much sooner if necessary.  Carlton Adam, PA-C 07/22/2015  ADDENDUM: Hematology/Oncology Attending: I had a face to face encounter with the patient. I recommended his care plan. This is a very pleasant 74 years old white male with metastatic non-small cell lung cancer currently undergoing treatment with immunotherapy with Nivolumab status post 11 cycles. He has been tolerating his treatment fairly well with no significant adverse effects. The patient was diagnosed with acute cholecystitis few weeks ago and he has biliary drainage and scheduled to have an appointment  with the general surgeon later this month for consideration of cholecystectomy. I recommended for the patient to proceed with his treatment today as a scheduled. He would come back for follow-up visit in 2 weeks for evaluation after repeating CT scan of the chest, abdomen and pelvis for restaging of his disease. He was advised to call immediately if he has any concerning symptoms in the interval.  Disclaimer: This note was dictated with voice recognition software. Similar sounding words can inadvertently be transcribed and may be missed upon review. Eilleen Kempf., MD 07/28/2015

## 2015-07-22 NOTE — Patient Instructions (Signed)
[facility] Discharge Instructions for Patients  Today you received the following: Nivolumab   To help prevent nausea and vomiting after your treatment, we encourage you to take your nausea medication as directed.    If you develop nausea and vomiting that is not controlled by your nausea medication, call the clinic.   BELOW ARE SYMPTOMS THAT SHOULD BE REPORTED IMMEDIATELY:  *FEVER GREATER THAN 100.5 F  *CHILLS WITH OR WITHOUT FEVER  NAUSEA AND VOMITING THAT IS NOT CONTROLLED WITH YOUR NAUSEA MEDICATION  *UNUSUAL SHORTNESS OF BREATH  *UNUSUAL BRUISING OR BLEEDING  TENDERNESS IN MOUTH AND THROAT WITH OR WITHOUT PRESENCE OF ULCERS  *URINARY PROBLEMS  *BOWEL PROBLEMS  UNUSUAL RASH Items with * indicate a potential emergency and should be followed up as soon as possible.  Feel free to call the clinic you have any questions or concerns. The clinic phone number is [phone].  Please show the CHEMO ALERT CARD at check-in to the Emergency Department and triage nurse.   

## 2015-07-24 ENCOUNTER — Telehealth: Payer: Self-pay | Admitting: Internal Medicine

## 2015-07-24 DIAGNOSIS — I4891 Unspecified atrial fibrillation: Secondary | ICD-10-CM | POA: Diagnosis not present

## 2015-07-24 DIAGNOSIS — K219 Gastro-esophageal reflux disease without esophagitis: Secondary | ICD-10-CM | POA: Diagnosis not present

## 2015-07-24 DIAGNOSIS — I10 Essential (primary) hypertension: Secondary | ICD-10-CM | POA: Diagnosis not present

## 2015-07-24 DIAGNOSIS — Z85118 Personal history of other malignant neoplasm of bronchus and lung: Secondary | ICD-10-CM | POA: Diagnosis not present

## 2015-07-24 DIAGNOSIS — K810 Acute cholecystitis: Secondary | ICD-10-CM | POA: Diagnosis not present

## 2015-07-24 DIAGNOSIS — Z96612 Presence of left artificial shoulder joint: Secondary | ICD-10-CM | POA: Diagnosis not present

## 2015-07-24 NOTE — Telephone Encounter (Signed)
Patient should be taking Lasix 20 mg daily according to his last visit at the [facility] on 07/20/2015. Patient is unsure of what he is taking and will need to talk to his wife. Patient will call back.

## 2015-07-24 NOTE — Telephone Encounter (Signed)
New message    Nurse states: Pt was seen on Monday at afib and pt was taken off of lasix Nurse needs to know if he needs to continue with potassium Nurse asks that the pt be called at [phone] for clarification Please call to discuss

## 2015-07-24 NOTE — Telephone Encounter (Signed)
Patient called back, he is taking Lasix 20 mg daily. He apologized for the misunderstanding. Informed patient that I would call his home health nurse to make sure she knows what patient is taking. Patient verbalized understanding.

## 2015-07-27 DIAGNOSIS — Z5181 Encounter for therapeutic drug level monitoring: Secondary | ICD-10-CM | POA: Diagnosis not present

## 2015-07-27 DIAGNOSIS — K8050 Calculus of bile duct without cholangitis or cholecystitis without obstruction: Secondary | ICD-10-CM | POA: Diagnosis not present

## 2015-07-27 DIAGNOSIS — K8020 Calculus of gallbladder without cholecystitis without obstruction: Secondary | ICD-10-CM | POA: Diagnosis not present

## 2015-07-28 NOTE — Patient Instructions (Signed)
Follow up with Dr. Dalbert Batman as scheduled Follow up in 2 weeks, prior to your next scheduled cycle of immunotherapy with a restaging CT scan of your chest, abdomen and pelvis to re-evaluate your disease

## 2015-07-30 ENCOUNTER — Ambulatory Visit (HOSPITAL_COMMUNITY)
Admission: RE | Admit: 2015-07-30 | Discharge: 2015-07-30 | Disposition: A | Discharge status: Home / Self Care | Payer: Medicare Other | Source: Ambulatory Visit | Attending: Nurse Practitioner | Admitting: Nurse Practitioner

## 2015-07-30 ENCOUNTER — Encounter (HOSPITAL_COMMUNITY): Payer: Self-pay | Admitting: Nurse Practitioner

## 2015-07-30 VITALS — BP 142/78 | HR 82 | Ht 72.0 in | Wt 157.8 lb

## 2015-07-30 DIAGNOSIS — R42 Dizziness and giddiness: Secondary | ICD-10-CM | POA: Diagnosis not present

## 2015-07-30 DIAGNOSIS — K829 Disease of gallbladder, unspecified: Secondary | ICD-10-CM | POA: Diagnosis not present

## 2015-07-30 DIAGNOSIS — I119 Hypertensive heart disease without heart failure: Secondary | ICD-10-CM | POA: Insufficient documentation

## 2015-07-30 DIAGNOSIS — I480 Paroxysmal atrial fibrillation: Secondary | ICD-10-CM | POA: Diagnosis not present

## 2015-07-30 DIAGNOSIS — C3431 Malignant neoplasm of lower lobe, right bronchus or lung: Secondary | ICD-10-CM | POA: Diagnosis not present

## 2015-07-30 DIAGNOSIS — E279 Disorder of adrenal gland, unspecified: Secondary | ICD-10-CM | POA: Insufficient documentation

## 2015-07-30 NOTE — Progress Notes (Signed)
Patient ID: Anthony Curry, male   DOB: 05-27-1941, 74 y.o.   MRN: [id number] Patient ID: Anthony Curry, male   DOB: 25-Nov-1941, 74 y.o.   MRN: [id number]       PCP:  [name], MD  Electrophysiologist: Dr. [name]  The patient presents today for follow up in the afib clinic.  He was diagnosed with atrial fibrillation in May in the setting of treatment for his Stage IV lung CA. Had hospitalization 8/15 for AF with RVR. He had successful cardioversion 09/29/14. He continues with SR on diltiazem and metoprolol.   He has finished chemo/radiation for lung CA but is going thru evaluation for adrenal mass. He was recently hospitalized for possible gallbladder disease and currently has a drain in place. He is thinking  surgery in the near future.  He is unaware of any Afib and ekg shows nsr. Tolerating NOAC. He is having some dizziness with walking and is using a cane. When he was last seen by Dr. [name], he was suppose to cut lasix in half and cut metoprolol in half. His wife got confused and he has been taking a full lasix daily and cut back to 1/4 tab of metoprolol a day. Bp sitting 140/80, BP standing 120/60. He did become mildly dizzy with standing.  He returns today having stopped metoprolol and reducing lasix to 1/2 tab a day. Dizziness may be slightly improved but he still is bothered with dizziness. Since he tolerated decreasing fluid pill without any evidence of fluid overload will decrease to 1/2 tab every other day.He is pending an appointment 8/31 and then will be decided if he will have cholecystomy.   He is otherwise doing reasonably well.  Today, he denies symptoms of palpitations, chest pain, some  lower extremity edema, dizziness, presyncope, syncope, or neurologic sequela.  The patient feels that he is tolerating medications without difficulties and is otherwise without complaint today.   Past Medical History  Diagnosis Date  . Hypertension   . GERD (gastroesophageal reflux  disease)   . Alcohol abuse   . 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 Low sodium   . Persistent atrial fibrillation 04/11/14    chads2vasc score is 2  . Hypotension   . Atrial fibrillation with RVR   . HCAP (healthcare-associated pneumonia)   . Adrenal hemorrhage   . BPH (benign prostatic hyperplasia)   . Hyponatremia   . Anemia of chronic disease   . Fall   . Alcohol intoxication   . Pulmonary nodules   . Adrenal mass   . Atrial flutter by electrocardiogram   . 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 Alcoholism in remission   . Radiation 12/02/14-12/16/14    Left adrenal gland 30 Gy in 10 fractions  . Cancer     small cell/lung  . Neoplastic malignant related fatigue    Past Surgical History  Procedure Laterality Date  . Hemorrhoid surgery    . Ankle arthrodesis  1995    rt fx-hardware in  . Tonsillectomy    . Colonoscopy    . Excision / curettage bone cyst finger  2005    rt thumb  . Total shoulder arthroplasty  02/03/2012    Procedure: TOTAL SHOULDER ARTHROPLASTY;  Surgeon: [name], MD;  Location: [place];  Service: Orthopedics;  Laterality: Left;  . Cardioversion N/A 09/29/2014    Procedure: CARDIOVERSION;  Surgeon: Josue Hector, MD;  Location: [place];  Service: Cardiovascular;  Laterality: N/A;  Current Outpatient Prescriptions  Medication Sig Dispense Refill  . ALPRAZolam (XANAX) 0.25 MG tablet Take 0.25 mg by mouth 3 (three) times daily as needed for anxiety or sleep.    [name] apixaban (ELIQUIS) 5 MG TABS tablet Take 1 tablet (5 mg total) by mouth 2 (two) times daily. 180 tablet 0  . Ascorbic Acid (VITAMIN C PO) Take 1,000 mg by mouth daily.     . B Complex-C (SUPER B COMPLEX PO) Take 1 each by mouth daily.     . calcium carbonate (OS-CAL) 600 MG TABS Take 600 mg by mouth daily with breakfast.    . demeclocycline (DECLOMYCIN) 150 MG tablet Take 150 mg by mouth 2 (two) times daily.    [name] diltiazem (CARDIZEM CD) 180 MG  24 hr capsule Take 1 capsule (180 mg total) by mouth daily at 12 noon. 90 capsule 3  . furosemide (LASIX) 40 MG tablet Take 0.5 tablets (20 mg total) by mouth daily. 90 tablet 3  . HYDROcodone-acetaminophen (NORCO/VICODIN) 5-325 MG per tablet Take 1-2 tablets by mouth every 4 (four) hours as needed for moderate pain. 30 tablet 0  . KLOR-CON M20 20 MEQ tablet Take 20 mEq by mouth daily.     . Lactobacillus (ACIDOPHILUS) CAPS capsule Take 1 capsule by mouth daily.    [name] MAGNESIUM PO Take 400 mg by mouth daily.     . mirtazapine (REMERON) 15 MG tablet Take 1 tablet (15 mg total) by mouth at bedtime. (Patient taking differently: Take 15 mg by mouth at bedtime as needed (sleep). ) 30 tablet 0  . Multiple Vitamin (MULTIVITAMIN WITH MINERALS) TABS Take 1 tablet by mouth daily.    . Nivolumab (OPDIVO IV) Inject as directed every 2 weeks    . Nutritional Supplements (ENSURE COMPLETE PO) Take 1 Can by mouth 2 (two) times daily.    . Omega-3 Fatty Acids (FISH OIL PO) Take 1 tablet by mouth daily.    . pantoprazole (PROTONIX) 40 MG tablet Take 40 mg by mouth every morning.  8  . tamsulosin (FLOMAX) 0.4 MG CAPS capsule Take 0.4 mg by mouth daily.     [name] thiamine (VITAMIN B-1) 100 MG tablet Take 1 tablet (100 mg total) by mouth daily. 30 tablet 6  . vitamin E 400 UNIT capsule Take 400 Units by mouth daily.    . ondansetron (ZOFRAN ODT) 8 MG disintegrating tablet Take 1 tablet (8 mg total) by mouth every 8 (eight) hours as needed for nausea. (Patient not taking: Reported on 07/30/2015) 20 tablet 0   No current facility-administered medications for this encounter.    Allergies  Allergen Reactions  . Aspirin Other (See Comments)    Pt can not have Asprin due to medical conditions according to spouse    Social History   Social History  . Marital Status: Married    Spouse Name: N/A  . Number of Children: 3  . Years of Education: N/A   Occupational History  . 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 Smoking status: Former Smoker -- 2.00 packs/day for 30 years    Types: Cigarettes    Quit date: 12/05/1990  . Smokeless tobacco: Never Used  . Alcohol Use: No     Comment: does not drink now  . Drug Use: No  . Sexual Activity: Not Currently   Other Topics Concern  . Not on file   Social History Narrative   Retired from  [employer]    Family History  Problem Relation Age of Onset  . Brain cancer Brother   . Lung cancer Mother   . Hypertension Father   . Diabetes Paternal Grandmother   . Heart attack Father   . Heart attack Brother     ROS-  All systems are reviewed and are negative except as outlined in the HPI above  Physical Exam: Filed Vitals:   07/30/15 1057  BP: 142/78  Pulse: 82  Height: 6' (1.829 m)  Weight: 157 lb 12.8 oz (71.578 kg)    GEN- The patient is ill appearing, alert and oriented x 3 today.   Head- normocephalic, atraumatic Eyes-  Sclera clear, conjunctiva pink Ears- hearing intact Oropharynx- clear Neck- supple,   Lungs- decreased BS at the bases, normal work of breathing Heart- RRR GI- soft, NT, ND, + BS Extremities- no clubbing, cyanosis, 1t edemal rt, trace left MS- no significant deformity or atrophy Skin- no rash or lesion Psych- euthymic mood, full affect Neuro- strength and sensation are intact  ekg today reveals SR with one PC, otherwise normal EKG.  Epic records reviewed.   Assessment and Plan:  1. PAF   Staying in SR Coninue NOAC Continue diltazem   2. Stage IV lung CA Stable  3. Adrenal Mass Being treated  3. Hypertensive cardiovascular disease Stable No change required today  4. Dizziness, chronic but slightly improved Now off metoprolol  Decrease lasix to 20 mg(1/2 tab of 40 mg) every other day, but watch for weight gain, LLE, dyspnea and if occurs go back to every day. If weight /symptoms stable may be able to d/c. Drink adequate water   5. Gallbladder disease F/u as  planned If surgery required will need to stop DOAC for at least 2 days before surgery and resume after surgery when deemed safe from a bleeding standpoint.   F/u in 3 months.   Geroge Baseman Arisa [place], [facility] [address] [place],  [address] [phone]

## 2015-07-30 NOTE — Patient Instructions (Signed)
Your physician has recommended you make the following change in your medication:   1) Take only 0.5 of Furosemide (Lasix) every other day. 2) Watch for increased weight, ankle swelling, or shortness of breath. 3) If any of these symptoms occur, go back to 0.5 every day. 4) Follow up in 3 months.  Parking Code for the month will be: 900

## 2015-07-31 DIAGNOSIS — I10 Essential (primary) hypertension: Secondary | ICD-10-CM | POA: Diagnosis not present

## 2015-07-31 DIAGNOSIS — I4891 Unspecified atrial fibrillation: Secondary | ICD-10-CM | POA: Diagnosis not present

## 2015-07-31 DIAGNOSIS — K219 Gastro-esophageal reflux disease without esophagitis: Secondary | ICD-10-CM | POA: Diagnosis not present

## 2015-07-31 DIAGNOSIS — K810 Acute cholecystitis: Secondary | ICD-10-CM | POA: Diagnosis not present

## 2015-07-31 DIAGNOSIS — Z85118 Personal history of other malignant neoplasm of bronchus and lung: Secondary | ICD-10-CM | POA: Diagnosis not present

## 2015-07-31 DIAGNOSIS — Z96612 Presence of left artificial shoulder joint: Secondary | ICD-10-CM | POA: Diagnosis not present

## 2015-08-03 ENCOUNTER — Ambulatory Visit (HOSPITAL_COMMUNITY)
Admission: RE | Admit: 2015-08-03 | Discharge: 2015-08-03 | Disposition: A | Discharge status: Home / Self Care | Payer: Medicare Other | Source: Ambulatory Visit | Attending: Physician Assistant | Admitting: Physician Assistant

## 2015-08-03 ENCOUNTER — Encounter (HOSPITAL_COMMUNITY): Payer: Self-pay

## 2015-08-03 DIAGNOSIS — N62 Hypertrophy of breast: Secondary | ICD-10-CM | POA: Diagnosis not present

## 2015-08-03 DIAGNOSIS — J439 Emphysema, unspecified: Secondary | ICD-10-CM | POA: Diagnosis not present

## 2015-08-03 DIAGNOSIS — C3490 Malignant neoplasm of unspecified part of unspecified bronchus or lung: Secondary | ICD-10-CM | POA: Diagnosis not present

## 2015-08-03 DIAGNOSIS — I700 Atherosclerosis of aorta: Secondary | ICD-10-CM | POA: Diagnosis not present

## 2015-08-03 DIAGNOSIS — C3431 Malignant neoplasm of lower lobe, right bronchus or lung: Secondary | ICD-10-CM | POA: Insufficient documentation

## 2015-08-03 DIAGNOSIS — K769 Liver disease, unspecified: Secondary | ICD-10-CM | POA: Diagnosis not present

## 2015-08-03 MED ORDER — IOHEXOL 300 MG/ML  SOLN
100.0000 mL | Freq: Once | INTRAMUSCULAR | Status: AC | PRN
  Administered 2015-08-03: 100 mL via INTRAVENOUS

## 2015-08-04 ENCOUNTER — Other Ambulatory Visit: Payer: Self-pay | Admitting: General Surgery

## 2015-08-04 DIAGNOSIS — K810 Acute cholecystitis: Secondary | ICD-10-CM | POA: Diagnosis not present

## 2015-08-04 DIAGNOSIS — N400 Enlarged prostate without lower urinary tract symptoms: Secondary | ICD-10-CM | POA: Diagnosis not present

## 2015-08-04 DIAGNOSIS — K8050 Calculus of bile duct without cholangitis or cholecystitis without obstruction: Secondary | ICD-10-CM | POA: Diagnosis not present

## 2015-08-04 DIAGNOSIS — C3491 Malignant neoplasm of unspecified part of right bronchus or lung: Secondary | ICD-10-CM | POA: Diagnosis not present

## 2015-08-04 DIAGNOSIS — I4891 Unspecified atrial fibrillation: Secondary | ICD-10-CM | POA: Diagnosis not present

## 2015-08-04 DIAGNOSIS — I10 Essential (primary) hypertension: Secondary | ICD-10-CM | POA: Diagnosis not present

## 2015-08-05 ENCOUNTER — Other Ambulatory Visit (HOSPITAL_BASED_OUTPATIENT_CLINIC_OR_DEPARTMENT_OTHER): Payer: Medicare Other

## 2015-08-05 ENCOUNTER — Encounter: Payer: Self-pay | Admitting: Internal Medicine

## 2015-08-05 ENCOUNTER — Ambulatory Visit (HOSPITAL_BASED_OUTPATIENT_CLINIC_OR_DEPARTMENT_OTHER): Payer: Medicare Other | Admitting: Internal Medicine

## 2015-08-05 ENCOUNTER — Ambulatory Visit (HOSPITAL_BASED_OUTPATIENT_CLINIC_OR_DEPARTMENT_OTHER): Payer: Medicare Other

## 2015-08-05 VITALS — BP 147/66 | HR 66 | Temp 98.0°F | Resp 17 | Ht 72.0 in | Wt 156.5 lb

## 2015-08-05 DIAGNOSIS — C3431 Malignant neoplasm of lower lobe, right bronchus or lung: Secondary | ICD-10-CM

## 2015-08-05 DIAGNOSIS — K810 Acute cholecystitis: Secondary | ICD-10-CM

## 2015-08-05 DIAGNOSIS — Z79899 Other long term (current) drug therapy: Secondary | ICD-10-CM | POA: Diagnosis not present

## 2015-08-05 DIAGNOSIS — C7972 Secondary malignant neoplasm of left adrenal gland: Secondary | ICD-10-CM

## 2015-08-05 DIAGNOSIS — C3410 Malignant neoplasm of upper lobe, unspecified bronchus or lung: Secondary | ICD-10-CM

## 2015-08-05 DIAGNOSIS — I4891 Unspecified atrial fibrillation: Secondary | ICD-10-CM | POA: Diagnosis not present

## 2015-08-05 DIAGNOSIS — R530 Neoplastic (malignant) related fatigue: Secondary | ICD-10-CM

## 2015-08-05 DIAGNOSIS — E871 Hypo-osmolality and hyponatremia: Secondary | ICD-10-CM | POA: Diagnosis not present

## 2015-08-05 DIAGNOSIS — Z5112 Encounter for antineoplastic immunotherapy: Secondary | ICD-10-CM | POA: Diagnosis present

## 2015-08-05 DIAGNOSIS — C3492 Malignant neoplasm of unspecified part of left bronchus or lung: Secondary | ICD-10-CM

## 2015-08-05 LAB — TSH CHCC: TSH: 4.411 m[IU]/L — AB (ref 0.320–4.118)

## 2015-08-05 LAB — COMPREHENSIVE METABOLIC PANEL (CC13)
ALK PHOS: 95 U/L (ref 40–150)
ALT: 16 U/L (ref 0–55)
ANION GAP: 8 meq/L (ref 3–11)
AST: 24 U/L (ref 5–34)
Albumin: 3.9 g/dL (ref 3.5–5.0)
BILIRUBIN TOTAL: 0.82 mg/dL (ref 0.20–1.20)
BUN: 15.8 mg/dL (ref 7.0–26.0)
CALCIUM: 9.4 mg/dL (ref 8.4–10.4)
CO2: 25 mEq/L (ref 22–29)
CREATININE: 0.7 mg/dL (ref 0.7–1.3)
Chloride: 99 mEq/L (ref 98–109)
EGFR: >90 mL/min/{1.73_m2} (ref >90)
Glucose: 95 mg/dl (ref 70–140)
Potassium: 4 mEq/L (ref 3.5–5.1)
Sodium: 132 mEq/L — ABNORMAL LOW (ref 136–145)
TOTAL PROTEIN: 6.3 g/dL — AB (ref 6.4–8.3)

## 2015-08-05 LAB — CBC WITH DIFFERENTIAL/PLATELET
BASO%: 0.9 % (ref 0.0–2.0)
Basophils Absolute: 0 10*3/uL (ref 0.0–0.1)
EOS%: 7.1 % — ABNORMAL HIGH (ref 0.0–7.0)
Eosinophils Absolute: 0.3 10*3/uL (ref 0.0–0.5)
HEMATOCRIT: 40.2 % (ref 38.4–49.9)
HGB: 13.8 g/dL (ref 13.0–17.1)
LYMPH#: 1.2 10*3/uL (ref 0.9–3.3)
LYMPH%: 27.7 % (ref 14.0–49.0)
MCH: 32.1 pg (ref 27.2–33.4)
MCHC: 34.2 g/dL (ref 32.0–36.0)
MCV: 93.9 fL (ref 79.3–98.0)
MONO#: 0.6 10*3/uL (ref 0.1–0.9)
MONO%: 12.7 % (ref 0.0–14.0)
NEUT#: 2.3 10*3/uL (ref 1.5–6.5)
NEUT%: 51.6 % (ref 39.0–75.0)
PLATELETS: 265 10*3/uL (ref 140–400)
RBC: 4.28 10*6/uL (ref 4.20–5.82)
RDW: 14.7 % — ABNORMAL HIGH (ref 11.0–14.6)
WBC: 4.4 10*3/uL (ref 4.0–10.3)

## 2015-08-05 MED ORDER — NIVOLUMAB CHEMO INJECTION 100 MG/10ML
3.0000 mg/kg | Freq: Once | INTRAVENOUS | Status: AC
  Administered 2015-08-05: 220 mg via INTRAVENOUS
  Filled 2015-08-05: qty 18

## 2015-08-05 MED ORDER — SODIUM CHLORIDE 0.9 % IV SOLN
Freq: Once | INTRAVENOUS | Status: AC
  Administered 2015-08-05: 12:00:00 via INTRAVENOUS

## 2015-08-05 NOTE — Progress Notes (Signed)
[place] Telephone:([phone]) [phone]   Fax:(336) [place], [place] [address]  DIAGNOSIS: Stage IV (T1 A., N0, M1 B.) non-small cell lung cancer, adenocarcinoma with negative EGFR mutation and negative ALK gene translocation diagnosed in October of 2014.   Molecular profile: Negative for RET, ALK, BRAF, MET, EGFR.  Positive for ERBB2 A775T, [id number], KRAS G13E, MAP2K1 D67N (see full report).   PRIOR THERAPY:  1) Systemic chemotherapy with carboplatin for AUC of 5 and Alimta 500 mg/M2 every 3 weeks, status post 6 cycles with stable disease. First dose on 10/23/2013. 2) Maintenance systemic chemotherapy with single agent Alimta 500 mg/M2 every 3 weeks. Status post 8 cycles.   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 Sondra Come completed on 12/16/2014.  CURRENT THERAPY: Immunotherapy with Nivolumab 3 MG/KG every 2 weeks, status post 12 cycles. First cycle 01/28/2015  CHEMOTHERAPY INTENT: Palliative  CURRENT # OF CHEMOTHERAPY CYCLES: 13 CURRENT ANTIEMETICS: Zofran, Compazine and dexamethasone  CURRENT SMOKING STATUS: Former smoker  ORAL CHEMOTHERAPY AND CONSENT: None  CURRENT BISPHOSPHONATES USE: None  PAIN MANAGEMENT: 0/10  NARCOTICS INDUCED CONSTIPATION: None  LIVING WILL AND CODE STATUS: ?   INTERVAL HISTORY: Anthony Curry 74 y.o. male returns to the clinic today for followup visit accompanied by his wife. He is tolerating his current treatment with immunotherapy with Nivolumab fairly well with no significant adverse effects. He was seen recently by Dr. Dalbert Batman and Dr. Paulita Fujita. There are working together on coordinating ERCP as well as cholecystectomy but no date has been scheduled yet. He denied having any significant chest pain, shortness of breath, cough or hemoptysis. He denied having any significant fever or chills, no nausea or vomiting. He  is currently on demeclocycline for hyponatremia and he wonders if he can switch her to salt tablets for a short period to see if it will help. He had repeat CT scan of the chest, abdomen and pelvis performed recently and he is here for evaluation and discussion of his scan results.  MEDICAL HISTORY: Past Medical History  Diagnosis Date  . Hypertension   . GERD (gastroesophageal reflux disease)   . Alcohol abuse   . 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 Low sodium   . Persistent atrial fibrillation 04/11/14    chads2vasc score is 2  . Hypotension   . Atrial fibrillation with RVR   . HCAP (healthcare-associated pneumonia)   . Adrenal hemorrhage   . BPH (benign prostatic hyperplasia)   . Hyponatremia   . Anemia of chronic disease   . Fall   . Alcohol intoxication   . Pulmonary nodules   . Adrenal mass   . Atrial flutter by electrocardiogram   . 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 Alcoholism in remission   . Radiation 12/02/14-12/16/14    Left adrenal gland 30 Gy in 10 fractions  . Cancer     small cell/lung  . Neoplastic malignant related fatigue     ALLERGIES:  is allergic to aspirin.  MEDICATIONS:  Current Outpatient Prescriptions  Medication Sig Dispense Refill  . ALPRAZolam (XANAX) 0.25 MG tablet Take 0.25 mg by mouth 3 (three) times daily as needed for anxiety or sleep.    [name] apixaban (ELIQUIS) 5 MG TABS tablet Take 1 tablet (5 mg total) by mouth 2 (two) times daily. 180 tablet 0  . Ascorbic Acid (VITAMIN C PO) Take  1,000 mg by mouth daily.     . B Complex-C (SUPER B COMPLEX PO) Take 1 each by mouth daily.     . calcium carbonate (OS-CAL) 600 MG TABS Take 600 mg by mouth daily with breakfast.    . demeclocycline (DECLOMYCIN) 150 MG tablet Take 150 mg by mouth 2 (two) times daily.    [name] diltiazem (CARDIZEM CD) 180 MG 24 hr capsule Take 1 capsule (180 mg total) by mouth daily at 12 noon. 90 capsule 3  . furosemide (LASIX) 40 MG tablet Take 0.5 tablets  (20 mg total) by mouth daily. 90 tablet 3  . HYDROcodone-acetaminophen (NORCO/VICODIN) 5-325 MG per tablet Take 1-2 tablets by mouth every 4 (four) hours as needed for moderate pain. 30 tablet 0  . KLOR-CON M20 20 MEQ tablet Take 20 mEq by mouth daily.     . Lactobacillus (ACIDOPHILUS) CAPS capsule Take 1 capsule by mouth daily.    [name] MAGNESIUM PO Take 400 mg by mouth daily.     . Multiple Vitamin (MULTIVITAMIN WITH MINERALS) TABS Take 1 tablet by mouth daily.    . Nivolumab (OPDIVO IV) Inject as directed every 2 weeks    . Nutritional Supplements (ENSURE COMPLETE PO) Take 1 Can by mouth 2 (two) times daily.    . Omega-3 Fatty Acids (FISH OIL PO) Take 1 tablet by mouth daily.    . pantoprazole (PROTONIX) 40 MG tablet Take 40 mg by mouth every morning.  8  . tamsulosin (FLOMAX) 0.4 MG CAPS capsule Take 0.4 mg by mouth daily.     [name] thiamine (VITAMIN B-1) 100 MG tablet Take 1 tablet (100 mg total) by mouth daily. 30 tablet 6  . vitamin E 400 UNIT capsule Take 400 Units by mouth daily.    . mirtazapine (REMERON) 15 MG tablet Take 1 tablet (15 mg total) by mouth at bedtime. (Patient not taking: Reported on 08/05/2015) 30 tablet 0  . ondansetron (ZOFRAN ODT) 8 MG disintegrating tablet Take 1 tablet (8 mg total) by mouth every 8 (eight) hours as needed for nausea. (Patient not taking: Reported on 07/30/2015) 20 tablet 0   No current facility-administered medications for this visit.    SURGICAL HISTORY:  Past Surgical History  Procedure Laterality Date  . Hemorrhoid surgery    . Ankle arthrodesis  1995    rt fx-hardware in  . Tonsillectomy    . Colonoscopy    . Excision / curettage bone cyst finger  2005    rt thumb  . Total shoulder arthroplasty  02/03/2012    Procedure: TOTAL SHOULDER ARTHROPLASTY;  Surgeon: [name], MD;  Location: [facility];  Service: Orthopedics;  Laterality: Left;  . Cardioversion N/A 09/29/2014    Procedure: CARDIOVERSION;  Surgeon: Josue Hector,  MD;  Location: [facility] ENDOSCOPY;  Service: Cardiovascular;  Laterality: N/A;    REVIEW OF SYSTEMS:  Constitutional: negative Eyes: negative Ears, nose, mouth, throat, and face: negative Respiratory: negative Cardiovascular: negative Gastrointestinal: negative Genitourinary:negative Integument/breast: negative Hematologic/lymphatic: negative Musculoskeletal:negative Neurological: negative Behavioral/Psych: negative Endocrine: negative Allergic/Immunologic: negative   PHYSICAL EXAMINATION: General appearance: alert, cooperative, fatigued and no distress Head: Normocephalic, without obvious abnormality, atraumatic Neck: no adenopathy, no JVD, supple, symmetrical, trachea midline and thyroid not enlarged, symmetric, no tenderness/mass/nodules Lymph nodes: Cervical, supraclavicular, and axillary nodes normal. Resp: clear to auscultation bilaterally Back: symmetric, no curvature. ROM normal. No CVA tenderness. Cardio: regular rate and rhythm, S1, S2 normal, no murmur, click, rub or gallop GI: Right upper  quadrant tenderness to palpation Extremities: extremities normal, atraumatic, no cyanosis or edema Neurologic: Alert and oriented X 3, normal strength and tone. Normal symmetric reflexes. Normal coordination and gait  ECOG PERFORMANCE STATUS: 1 - Symptomatic but completely ambulatory  Blood pressure 147/66, pulse 66, temperature 98 F (36.7 C), temperature source Oral, resp. rate 17, height 6' (1.829 m), weight 156 lb 8 oz (70.988 kg), SpO2 98 %.  LABORATORY DATA: Lab Results  Component Value Date   WBC 4.4 08/05/2015   HGB 13.8 08/05/2015   HCT 40.2 08/05/2015   MCV 93.9 08/05/2015   PLT 265 08/05/2015      Chemistry      Component Value Date/Time   NA 133* 07/22/2015 1055   NA 129* 06/15/2015 0519   K 3.9 07/22/2015 1055   K 4.1 06/15/2015 0519   CL 92* 06/15/2015 0519   CO2 24 07/22/2015 1055   CO2 29 06/15/2015 0519   BUN 12.6 07/22/2015 1055   BUN 10 06/15/2015 0519     CREATININE 0.8 07/22/2015 1055   CREATININE 0.95 06/15/2015 0519      Component Value Date/Time   CALCIUM 9.5 07/22/2015 1055   CALCIUM 8.9 06/15/2015 0519   ALKPHOS 93 07/22/2015 1055   ALKPHOS 68 06/13/2015 0530   AST 23 07/22/2015 1055   AST 20 06/13/2015 0530   ALT 17 07/22/2015 1055   ALT 16* 06/13/2015 0530   BILITOT 0.72 07/22/2015 1055   BILITOT 0.7 06/13/2015 0530       RADIOGRAPHIC STUDIES: Ct Chest W Contrast  08/03/2015   CLINICAL DATA:  Restaging non small cell lung cancer. Initial diagnosis 2015  EXAM: CT CHEST, ABDOMEN, AND PELVIS WITH CONTRAST  TECHNIQUE: Multidetector CT imaging of the chest, abdomen and pelvis was performed following the standard protocol during bolus administration of intravenous contrast.  CONTRAST:  1106m OMNIPAQUE IOHEXOL 300 MG/ML  SOLN  COMPARISON:  CT scans 06/09/2015  FINDINGS: CT CHEST FINDINGS  Chest wall: Stable asymmetric right-sided gynecomastia measuring 32 x 12.5 mm. No supraclavicular or axillary lymphadenopathy. The bony thorax is intact. Numerous remote right-sided rib fractures are noted. No destructive bone lesions. Stable advanced degenerative changes involving the entire spine.  Mediastinum: The heart is normal in size. Small stable pericardial effusion. Stable advanced aortic calcifications without dissection or focal aneurysm. Stable three-vessel coronary artery calcifications. Small scattered stable mediastinal and hilar lymph nodes. No mass or overt adenopathy. The esophagus is grossly normal.  Lungs/pleura:  Stable advanced emphysematous changes.  Numerous ill-defined ground-glass opacities are noted in both lungs. These are difficult to actually measure but are grossly stable. No progressive findings or new findings. The left upper lobe density is stable at 13.5 x 6.5 mm. The right basilar atelectasis is much improved. No pleural effusion. Stable pleural calcification at the left lung base. Stable 3 mm nodule in the right middle  lobe on image number 54 no acute pulmonary findings. No pleural effusion.  CT ABDOMEN AND PELVIS FINDINGS  Hepatobiliary: Stable small low-attenuation liver lesions, 1 is in segment 2 on image 59 and the other is in segment 5 on image number 79. There are also 2 hyperdense or enhancing nodules, exophytic 7 mm nodule in segment 2 on image 58 and 6.5 mm nodule in segment 8 on image number 57. The cholecystostomy catheter is in place. No complicating features.  Pancreas: No mass, inflammation or ductal dilatation.  Spleen: Normal size.  No focal lesions.  Adrenals/Urinary Tract: The adrenal glands and kidneys are unremarkable  and stable. No findings for adrenal glands metastasis.  Stomach/Bowel: The stomach, duodenum, small bowel and colon are unremarkable. No inflammatory changes, mass lesions or obstructive findings. The terminal ileum is normal. The appendix is normal.  Vascular/Lymphatic: Advanced atherosclerotic calcifications involving the aorta and branch vessels but no focal aneurysm or dissection. The major venous structures are patent. No mesenteric or retroperitoneal mass or adenopathy. Small scattered lymph nodes are stable.  Other: The bladder is distended and moderately irregular with small cellules, trabeculation and diverticuli. No mass or bladder wall thickening. The prostate gland and seminal vesicles are unremarkable. No pelvic mass or adenopathy. No free pelvic fluid collections. No inguinal mass or adenopathy.  Musculoskeletal: Advanced degenerative changes involving the lumbar spine but no acute bony findings or destructive bony changes.  IMPRESSION: 1. Stable emphysematous changes and stable numerous ill-defined ground-glass opacities in both lungs. There are also stable pulmonary nodules. No new or acute findings. 2. No mediastinal or hilar mass or lymphadenopathy. 3. Stable small liver lesions. 4. No abdominal/pelvic lymphadenopathy. 5. Stable advanced atherosclerotic calcifications involving  the thoracic and abdominal aorta. 6. Cholecystostomy tube in place without complicating features. 7. Stable right-sided gynecomastia.   Electronically Signed   By: Marijo Sanes M.D.   On: 08/03/2015 16:07   Ct Abdomen Pelvis W Contrast  08/03/2015   CLINICAL DATA:  Restaging non small cell lung cancer. Initial diagnosis 2015  EXAM: CT CHEST, ABDOMEN, AND PELVIS WITH CONTRAST  TECHNIQUE: Multidetector CT imaging of the chest, abdomen and pelvis was performed following the standard protocol during bolus administration of intravenous contrast.  CONTRAST:  169m OMNIPAQUE IOHEXOL 300 MG/ML  SOLN  COMPARISON:  CT scans 06/09/2015  FINDINGS: CT CHEST FINDINGS  Chest wall: Stable asymmetric right-sided gynecomastia measuring 32 x 12.5 mm. No supraclavicular or axillary lymphadenopathy. The bony thorax is intact. Numerous remote right-sided rib fractures are noted. No destructive bone lesions. Stable advanced degenerative changes involving the entire spine.  Mediastinum: The heart is normal in size. Small stable pericardial effusion. Stable advanced aortic calcifications without dissection or focal aneurysm. Stable three-vessel coronary artery calcifications. Small scattered stable mediastinal and hilar lymph nodes. No mass or overt adenopathy. The esophagus is grossly normal.  Lungs/pleura:  Stable advanced emphysematous changes.  Numerous ill-defined ground-glass opacities are noted in both lungs. These are difficult to actually measure but are grossly stable. No progressive findings or new findings. The left upper lobe density is stable at 13.5 x 6.5 mm. The right basilar atelectasis is much improved. No pleural effusion. Stable pleural calcification at the left lung base. Stable 3 mm nodule in the right middle lobe on image number 54 no acute pulmonary findings. No pleural effusion.  CT ABDOMEN AND PELVIS FINDINGS  Hepatobiliary: Stable small low-attenuation liver lesions, 1 is in segment 2 on image 59 and the other  is in segment 5 on image number 79. There are also 2 hyperdense or enhancing nodules, exophytic 7 mm nodule in segment 2 on image 58 and 6.5 mm nodule in segment 8 on image number 57. The cholecystostomy catheter is in place. No complicating features.  Pancreas: No mass, inflammation or ductal dilatation.  Spleen: Normal size.  No focal lesions.  Adrenals/Urinary Tract: The adrenal glands and kidneys are unremarkable and stable. No findings for adrenal glands metastasis.  Stomach/Bowel: The stomach, duodenum, small bowel and colon are unremarkable. No inflammatory changes, mass lesions or obstructive findings. The terminal ileum is normal. The appendix is normal.  Vascular/Lymphatic: Advanced atherosclerotic calcifications involving  the aorta and branch vessels but no focal aneurysm or dissection. The major venous structures are patent. No mesenteric or retroperitoneal mass or adenopathy. Small scattered lymph nodes are stable.  Other: The bladder is distended and moderately irregular with small cellules, trabeculation and diverticuli. No mass or bladder wall thickening. The prostate gland and seminal vesicles are unremarkable. No pelvic mass or adenopathy. No free pelvic fluid collections. No inguinal mass or adenopathy.  Musculoskeletal: Advanced degenerative changes involving the lumbar spine but no acute bony findings or destructive bony changes.  IMPRESSION: 1. Stable emphysematous changes and stable numerous ill-defined ground-glass opacities in both lungs. There are also stable pulmonary nodules. No new or acute findings. 2. No mediastinal or hilar mass or lymphadenopathy. 3. Stable small liver lesions. 4. No abdominal/pelvic lymphadenopathy. 5. Stable advanced atherosclerotic calcifications involving the thoracic and abdominal aorta. 6. Cholecystostomy tube in place without complicating features. 7. Stable right-sided gynecomastia.   Electronically Signed   By: Marijo Sanes M.D.   On: 08/03/2015 16:07    Dg Sinus/fist Tube Chk-non Gi  07/21/2015   CLINICAL DATA:  74 year old male with stage IV non-small cell lung cancer and recent episode of acute calculus cholecystitis treated by percutaneous cholecystostomy tube placement on 06/13/2015. He presents for cholangiogram through existing biliary tube.  EXAM: CHOLANGIOGRAM THROUGH EXISTING ACCESS  CONTRAST:  15 mL Omnipaque 300  FLUOROSCOPY TIME:  Radiation Exposure Index (as provided by the fluoroscopic device): 21 dGy  COMPARISON:  Cholecystostomy tube placement images 7 08/25/2015  FINDINGS: An initial spot image demonstrates the tube in the expected location in the right upper quadrant. Hand injection of contrast material was performed under real-time fluoroscopic imaging. The gallbladder is contracted. After sufficient pressurization, contrast material slowly fills the cystic duct. There is a focal filling defect in the cystic duct consistent with the presence of a small stone. This is incompletely occlusive. Contrast material eventually passes through the remainder the cystic duct and enters the common bile duct.  There is a second filling defect in the distal common bile duct consistent with choledocholithiasis. Again, this is not obstructing as contrast material does pass beyond this stone, through the ampulla and into the duodenum. The drain was capped.  IMPRESSION: 1. Cholelithiasis with an incompletely obstructing stone in the cystic duct. 2. Positive for choledocholithiasis. There is a filling defect in the distal common bile duct which is not occlusive. Contrast material passes around the stone and into the duodenum.   Electronically Signed   By: Jacqulynn Cadet M.D.   On: 07/21/2015 15:46   ASSESSMENT AND PLAN: This is a very pleasant 74 years old white male with:  1)  Stage IV non-small cell lung cancer, adenocarcinoma status post induction chemotherapy with carboplatin and Alimta and currently on maintenance treatment with single agent Alimta  status post 8 cycles. He has been observation for the last few months.  He had evidence for disease progression on the left adrenal gland and the patient underwent palliative radiotherapy to the left adrenal gland under the care of Dr. Sondra Come completed on 12/16/2014. He is currently undergoing treatment with immunotherapy with Nivolumab status post 12 cycles and tolerating his treatment fairly well. His recent CT scan of the chest, abdomen and pelvis showed no significant evidence for disease progression. I discussed the scan results with the patient and his wife. I recommended for him to continue his current treatment with immunotherapy and he will proceed with cycle #13 today as scheduled. The patient would come back for  follow-up visit in 2 weeks with the start of cycle #14.  2) acute cholecystitis: Currently has biliary drainage and he is expected to have cholecystectomy and few weeks.  3) Atrial fibrillation: He'll continue his current medication with Cardizem, digoxin and metoprolol in addition to Eliquis. He'll also continue his routine followup visit with his cardiologist Dr. [name].  4) history of hyponatremia: He will discontinue his treatment with demeclocycline for now and try some salt tablets. We will continue to monitor his lab work closely.  He was advised to call immediately if he has any concerning symptoms in the interval. The patient voices understanding of current disease status and treatment options and is in agreement with the current care plan.  All questions were answered. The patient knows to call the clinic with any problems, questions or concerns. We can certainly see the patient much sooner if necessary.  Disclaimer: This note was dictated with voice recognition software. Similar sounding words can inadvertently be transcribed and may not be corrected upon review.

## 2015-08-05 NOTE — Patient Instructions (Signed)
[facility] Discharge Instructions for Patients  Today you received the following: Nivolumab   To help prevent nausea and vomiting after your treatment, we encourage you to take your nausea medication as directed.    If you develop nausea and vomiting that is not controlled by your nausea medication, call the clinic.   BELOW ARE SYMPTOMS THAT SHOULD BE REPORTED IMMEDIATELY:  *FEVER GREATER THAN 100.5 F  *CHILLS WITH OR WITHOUT FEVER  NAUSEA AND VOMITING THAT IS NOT CONTROLLED WITH YOUR NAUSEA MEDICATION  *UNUSUAL SHORTNESS OF BREATH  *UNUSUAL BRUISING OR BLEEDING  TENDERNESS IN MOUTH AND THROAT WITH OR WITHOUT PRESENCE OF ULCERS  *URINARY PROBLEMS  *BOWEL PROBLEMS  UNUSUAL RASH Items with * indicate a potential emergency and should be followed up as soon as possible.  Feel free to call the clinic you have any questions or concerns. The clinic phone number is [phone].  Please show the CHEMO ALERT CARD at check-in to the Emergency Department and triage nurse.   

## 2015-08-07 DIAGNOSIS — Z85118 Personal history of other malignant neoplasm of bronchus and lung: Secondary | ICD-10-CM | POA: Diagnosis not present

## 2015-08-07 DIAGNOSIS — I10 Essential (primary) hypertension: Secondary | ICD-10-CM | POA: Diagnosis not present

## 2015-08-07 DIAGNOSIS — Z96612 Presence of left artificial shoulder joint: Secondary | ICD-10-CM | POA: Diagnosis not present

## 2015-08-07 DIAGNOSIS — I4891 Unspecified atrial fibrillation: Secondary | ICD-10-CM | POA: Diagnosis not present

## 2015-08-07 DIAGNOSIS — K219 Gastro-esophageal reflux disease without esophagitis: Secondary | ICD-10-CM | POA: Diagnosis not present

## 2015-08-07 DIAGNOSIS — K810 Acute cholecystitis: Secondary | ICD-10-CM | POA: Diagnosis not present

## 2015-08-14 DIAGNOSIS — Z96612 Presence of left artificial shoulder joint: Secondary | ICD-10-CM | POA: Diagnosis not present

## 2015-08-14 DIAGNOSIS — I10 Essential (primary) hypertension: Secondary | ICD-10-CM | POA: Diagnosis not present

## 2015-08-14 DIAGNOSIS — I4891 Unspecified atrial fibrillation: Secondary | ICD-10-CM | POA: Diagnosis not present

## 2015-08-14 DIAGNOSIS — K219 Gastro-esophageal reflux disease without esophagitis: Secondary | ICD-10-CM | POA: Diagnosis not present

## 2015-08-14 DIAGNOSIS — K810 Acute cholecystitis: Secondary | ICD-10-CM | POA: Diagnosis not present

## 2015-08-14 DIAGNOSIS — Z85118 Personal history of other malignant neoplasm of bronchus and lung: Secondary | ICD-10-CM | POA: Diagnosis not present

## 2015-08-18 ENCOUNTER — Other Ambulatory Visit: Payer: Self-pay | Admitting: Physician Assistant

## 2015-08-18 DIAGNOSIS — C3431 Malignant neoplasm of lower lobe, right bronchus or lung: Secondary | ICD-10-CM

## 2015-08-19 ENCOUNTER — Ambulatory Visit (HOSPITAL_BASED_OUTPATIENT_CLINIC_OR_DEPARTMENT_OTHER): Payer: Medicare Other

## 2015-08-19 ENCOUNTER — Ambulatory Visit (HOSPITAL_BASED_OUTPATIENT_CLINIC_OR_DEPARTMENT_OTHER): Payer: Medicare Other | Admitting: Physician Assistant

## 2015-08-19 ENCOUNTER — Other Ambulatory Visit (HOSPITAL_BASED_OUTPATIENT_CLINIC_OR_DEPARTMENT_OTHER): Payer: Medicare Other

## 2015-08-19 ENCOUNTER — Encounter: Payer: Self-pay | Admitting: Physician Assistant

## 2015-08-19 VITALS — BP 139/65 | HR 79 | Temp 97.7°F | Resp 17 | Ht 72.0 in | Wt 153.6 lb

## 2015-08-19 DIAGNOSIS — C3431 Malignant neoplasm of lower lobe, right bronchus or lung: Secondary | ICD-10-CM

## 2015-08-19 DIAGNOSIS — Z5112 Encounter for antineoplastic immunotherapy: Secondary | ICD-10-CM | POA: Diagnosis present

## 2015-08-19 DIAGNOSIS — C3410 Malignant neoplasm of upper lobe, unspecified bronchus or lung: Secondary | ICD-10-CM

## 2015-08-19 DIAGNOSIS — C7972 Secondary malignant neoplasm of left adrenal gland: Secondary | ICD-10-CM

## 2015-08-19 LAB — COMPREHENSIVE METABOLIC PANEL (CC13)
ALT: 25 U/L (ref 0–55)
ANION GAP: 9 meq/L (ref 3–11)
AST: 27 U/L (ref 5–34)
Albumin: 4 g/dL (ref 3.5–5.0)
Alkaline Phosphatase: 93 U/L (ref 40–150)
BUN: 12.2 mg/dL (ref 7.0–26.0)
CHLORIDE: 100 meq/L (ref 98–109)
CO2: 24 meq/L (ref 22–29)
Calcium: 9.6 mg/dL (ref 8.4–10.4)
Creatinine: 0.7 mg/dL (ref 0.7–1.3)
EGFR: 90 mL/min/{1.73_m2} — AB (ref >90)
GLUCOSE: 100 mg/dL (ref 70–140)
POTASSIUM: 3.9 meq/L (ref 3.5–5.1)
SODIUM: 133 meq/L — AB (ref 136–145)
TOTAL PROTEIN: 6.5 g/dL (ref 6.4–8.3)
Total Bilirubin: 0.79 mg/dL (ref 0.20–1.20)

## 2015-08-19 LAB — CBC WITH DIFFERENTIAL/PLATELET
BASO%: 0.7 % (ref 0.0–2.0)
Basophils Absolute: 0 10*3/uL (ref 0.0–0.1)
EOS ABS: 0.3 10*3/uL (ref 0.0–0.5)
EOS%: 6.6 % (ref 0.0–7.0)
HCT: 40.7 % (ref 38.4–49.9)
HGB: 14.1 g/dL (ref 13.0–17.1)
LYMPH%: 26.8 % (ref 14.0–49.0)
MCH: 32.5 pg (ref 27.2–33.4)
MCHC: 34.7 g/dL (ref 32.0–36.0)
MCV: 93.7 fL (ref 79.3–98.0)
MONO#: 0.5 10*3/uL (ref 0.1–0.9)
MONO%: 12.4 % (ref 0.0–14.0)
NEUT%: 53.5 % (ref 39.0–75.0)
NEUTROS ABS: 2.1 10*3/uL (ref 1.5–6.5)
PLATELETS: 239 10*3/uL (ref 140–400)
RBC: 4.34 10*6/uL (ref 4.20–5.82)
RDW: 14.4 % (ref 11.0–14.6)
WBC: 3.9 10*3/uL — AB (ref 4.0–10.3)
lymph#: 1 10*3/uL (ref 0.9–3.3)

## 2015-08-19 MED ORDER — SODIUM CHLORIDE 0.9 % IV SOLN
Freq: Once | INTRAVENOUS | Status: AC
  Administered 2015-08-19: 14:00:00 via INTRAVENOUS

## 2015-08-19 MED ORDER — SODIUM CHLORIDE 0.9 % IV SOLN
3.0000 mg/kg | Freq: Once | INTRAVENOUS | Status: AC
  Administered 2015-08-19: 220 mg via INTRAVENOUS
  Filled 2015-08-19: qty 22

## 2015-08-19 NOTE — Progress Notes (Addendum)
[place] [phone]   Fax:([phone]) [place], [place] [address]  DIAGNOSIS: Stage IV (T1 A., N0, M1 B.) non-small cell lung cancer, adenocarcinoma with negative EGFR mutation and negative ALK gene translocation diagnosed in October of 2014.   Molecular profile: Negative for RET, ALK, BRAF, MET, EGFR.  Positive for ERBB2 A775T, [id number], KRAS G13E, MAP2K1 D67N (see full report).   PRIOR THERAPY:  1) Systemic chemotherapy with carboplatin for AUC of 5 and Alimta 500 mg/M2 every 3 weeks, status post 6 cycles with stable disease. First dose on 10/23/2013. 2) Maintenance systemic chemotherapy with single agent Alimta 500 mg/M2 every 3 weeks. Status post 8 cycles.   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 Sondra Come completed on 12/16/2014.  CURRENT THERAPY: Immunotherapy with Nivolumab 3 MG/KG every 2 weeks, status post 13 cycles. First cycle 01/28/2015  CHEMOTHERAPY INTENT: Palliative  CURRENT # OF CHEMOTHERAPY CYCLES: 14 CURRENT ANTIEMETICS: Zofran, Compazine and dexamethasone  CURRENT SMOKING STATUS: Former smoker  ORAL CHEMOTHERAPY AND CONSENT: None  CURRENT BISPHOSPHONATES USE: None  PAIN MANAGEMENT: 0/10  NARCOTICS INDUCED CONSTIPATION: None  LIVING WILL AND CODE STATUS: ?   INTERVAL HISTORY: Anthony Curry 74 y.o. male returns to the clinic today for followup visit accompanied by his wife. He is tolerating his current treatment with immunotherapy with Nivolumab fairly well with no significant adverse effects. He is awaiting an appointment for the  ERCP as well as cholecystectomy  To be performed by Dr. Dalbert Batman and Dr. Paulita Fujita.  He denied having any significant chest pain, shortness of breath, cough or hemoptysis. He denied having any significant fever or chills, no nausea or vomiting. He is now on salt tablets to address his  hyponatremia.  MEDICAL HISTORY: Past Medical History  Diagnosis Date  . Hypertension   . GERD (gastroesophageal reflux disease)   . Alcohol abuse   . 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 Low sodium   . Persistent atrial fibrillation 04/11/14    chads2vasc score is 2  . Hypotension   . Atrial fibrillation with RVR   . HCAP (healthcare-associated pneumonia)   . Adrenal hemorrhage   . BPH (benign prostatic hyperplasia)   . Hyponatremia   . Anemia of chronic disease   . Fall   . Alcohol intoxication   . Pulmonary nodules   . Adrenal mass   . Atrial flutter by electrocardiogram   . 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 Alcoholism in remission   . Radiation 12/02/14-12/16/14    Left adrenal gland 30 Gy in 10 fractions  . Cancer     small cell/lung  . Neoplastic malignant related fatigue     ALLERGIES:  is allergic to aspirin.  MEDICATIONS:  Current Outpatient Prescriptions  Medication Sig Dispense Refill  . ALPRAZolam (XANAX) 0.25 MG tablet Take 0.25 mg by mouth 3 (three) times daily as needed for anxiety or sleep.    [name] apixaban (ELIQUIS) 5 MG TABS tablet Take 1 tablet (5 mg total) by mouth 2 (two) times daily. 180 tablet 0  . Ascorbic Acid (VITAMIN C PO) Take 1,000 mg by mouth daily.     . B Complex-C (SUPER B COMPLEX PO) Take 1 each by mouth daily.     . calcium carbonate (OS-CAL) 600 MG TABS Take 600 mg by mouth daily with breakfast.    . demeclocycline (DECLOMYCIN)  150 MG tablet Take 150 mg by mouth 2 (two) times daily.    [name] diltiazem (CARDIZEM CD) 180 MG 24 hr capsule Take 1 capsule (180 mg total) by mouth daily at 12 noon. 90 capsule 3  . furosemide (LASIX) 40 MG tablet Take 0.5 tablets (20 mg total) by mouth daily. 90 tablet 3  . HYDROcodone-acetaminophen (NORCO/VICODIN) 5-325 MG per tablet Take 1-2 tablets by mouth every 4 (four) hours as needed for moderate pain. 30 tablet 0  . KLOR-CON M20 20 MEQ tablet Take 20 mEq by mouth daily.     .  Lactobacillus (ACIDOPHILUS) CAPS capsule Take 1 capsule by mouth daily.    [name] MAGNESIUM PO Take 400 mg by mouth daily.     . mirtazapine (REMERON) 15 MG tablet Take 1 tablet (15 mg total) by mouth at bedtime. (Patient not taking: Reported on 08/05/2015) 30 tablet 0  . Multiple Vitamin (MULTIVITAMIN WITH MINERALS) TABS Take 1 tablet by mouth daily.    . Nivolumab (OPDIVO IV) Inject as directed every 2 weeks    . Nutritional Supplements (ENSURE COMPLETE PO) Take 1 Can by mouth 2 (two) times daily.    . Omega-3 Fatty Acids (FISH OIL PO) Take 1 tablet by mouth daily.    . ondansetron (ZOFRAN ODT) 8 MG disintegrating tablet Take 1 tablet (8 mg total) by mouth every 8 (eight) hours as needed for nausea. (Patient not taking: Reported on 07/30/2015) 20 tablet 0  . pantoprazole (PROTONIX) 40 MG tablet Take 40 mg by mouth every morning.  8  . tamsulosin (FLOMAX) 0.4 MG CAPS capsule Take 0.4 mg by mouth daily.     [name] thiamine (VITAMIN B-1) 100 MG tablet Take 1 tablet (100 mg total) by mouth daily. 30 tablet 6  . vitamin E 400 UNIT capsule Take 400 Units by mouth daily.     No current facility-administered medications for this visit.    SURGICAL HISTORY:  Past Surgical History  Procedure Laterality Date  . Hemorrhoid surgery    . Ankle arthrodesis  1995    rt fx-hardware in  . Tonsillectomy    . Colonoscopy    . Excision / curettage bone cyst finger  2005    rt thumb  . Total shoulder arthroplasty  02/03/2012    Procedure: TOTAL SHOULDER ARTHROPLASTY;  Surgeon: [name], MD;  Location: [facility];  Service: Orthopedics;  Laterality: Left;  . Cardioversion N/A 09/29/2014    Procedure: CARDIOVERSION;  Surgeon: Josue Hector, MD;  Location: [facility];  Service: Cardiovascular;  Laterality: N/A;    REVIEW OF SYSTEMS:  Constitutional: negative Eyes: negative Ears, nose, mouth, throat, and face: negative Respiratory: negative Cardiovascular: negative Gastrointestinal:  negative Genitourinary:negative Integument/breast: negative Hematologic/lymphatic: negative Musculoskeletal:negative Neurological: negative Behavioral/Psych: negative Endocrine: negative Allergic/Immunologic: negative   PHYSICAL EXAMINATION: General appearance: alert, cooperative, fatigued and no distress Head: Normocephalic, without obvious abnormality, atraumatic Neck: no adenopathy, no JVD, supple, symmetrical, trachea midline and thyroid not enlarged, symmetric, no tenderness/mass/nodules Lymph nodes: Cervical, supraclavicular, and axillary nodes normal. Resp: clear to auscultation bilaterally Back: symmetric, no curvature. ROM normal. No CVA tenderness. Cardio: regular rate and rhythm, S1, S2 normal, no murmur, click, rub or gallop GI: Right upper quadrant tenderness to palpation Extremities: extremities normal, atraumatic, no cyanosis or edema Neurologic: Alert and oriented X 3, normal strength and tone. Normal symmetric reflexes. Normal coordination and gait  ECOG PERFORMANCE STATUS: 1 - Symptomatic but completely ambulatory  Blood pressure 139/65, pulse 79, temperature 97.7  F (36.5 C), temperature source Oral, resp. rate 17, height 6' (1.829 m), weight 153 lb 9.6 oz (69.673 kg), SpO2 100 %.  LABORATORY DATA: Lab Results  Component Value Date   WBC 3.9* 08/19/2015   HGB 14.1 08/19/2015   HCT 40.7 08/19/2015   MCV 93.7 08/19/2015   PLT 239 08/19/2015      Chemistry      Component Value Date/Time   NA 133* 08/19/2015 1021   NA 129* 06/15/2015 0519   K 3.9 08/19/2015 1021   K 4.1 06/15/2015 0519   CL 92* 06/15/2015 0519   CO2 24 08/19/2015 1021   CO2 29 06/15/2015 0519   BUN 12.2 08/19/2015 1021   BUN 10 06/15/2015 0519   CREATININE 0.7 08/19/2015 1021   CREATININE 0.95 06/15/2015 0519      Component Value Date/Time   CALCIUM 9.6 08/19/2015 1021   CALCIUM 8.9 06/15/2015 0519   ALKPHOS 93 08/19/2015 1021   ALKPHOS 68 06/13/2015 0530   AST 27 08/19/2015 1021    AST 20 06/13/2015 0530   ALT 25 08/19/2015 1021   ALT 16* 06/13/2015 0530   BILITOT 0.79 08/19/2015 1021   BILITOT 0.7 06/13/2015 0530       RADIOGRAPHIC STUDIES: Ct Chest W Contrast  08/03/2015   CLINICAL DATA:  Restaging non small cell lung cancer. Initial diagnosis 2015  EXAM: CT CHEST, ABDOMEN, AND PELVIS WITH CONTRAST  TECHNIQUE: Multidetector CT imaging of the chest, abdomen and pelvis was performed following the standard protocol during bolus administration of intravenous contrast.  CONTRAST:  133m OMNIPAQUE IOHEXOL 300 MG/ML  SOLN  COMPARISON:  CT scans 06/09/2015  FINDINGS: CT CHEST FINDINGS  Chest wall: Stable asymmetric right-sided gynecomastia measuring 32 x 12.5 mm. No supraclavicular or axillary lymphadenopathy. The bony thorax is intact. Numerous remote right-sided rib fractures are noted. No destructive bone lesions. Stable advanced degenerative changes involving the entire spine.  Mediastinum: The heart is normal in size. Small stable pericardial effusion. Stable advanced aortic calcifications without dissection or focal aneurysm. Stable three-vessel coronary artery calcifications. Small scattered stable mediastinal and hilar lymph nodes. No mass or overt adenopathy. The esophagus is grossly normal.  Lungs/pleura:  Stable advanced emphysematous changes.  Numerous ill-defined ground-glass opacities are noted in both lungs. These are difficult to actually measure but are grossly stable. No progressive findings or new findings. The left upper lobe density is stable at 13.5 x 6.5 mm. The right basilar atelectasis is much improved. No pleural effusion. Stable pleural calcification at the left lung base. Stable 3 mm nodule in the right middle lobe on image number 54 no acute pulmonary findings. No pleural effusion.  CT ABDOMEN AND PELVIS FINDINGS  Hepatobiliary: Stable small low-attenuation liver lesions, 1 is in segment 2 on image 59 and the other is in segment 5 on image number 79. There  are also 2 hyperdense or enhancing nodules, exophytic 7 mm nodule in segment 2 on image 58 and 6.5 mm nodule in segment 8 on image number 57. The cholecystostomy catheter is in place. No complicating features.  Pancreas: No mass, inflammation or ductal dilatation.  Spleen: Normal size.  No focal lesions.  Adrenals/Urinary Tract: The adrenal glands and kidneys are unremarkable and stable. No findings for adrenal glands metastasis.  Stomach/Bowel: The stomach, duodenum, small bowel and colon are unremarkable. No inflammatory changes, mass lesions or obstructive findings. The terminal ileum is normal. The appendix is normal.  Vascular/Lymphatic: Advanced atherosclerotic calcifications involving the aorta and branch vessels but  no focal aneurysm or dissection. The major venous structures are patent. No mesenteric or retroperitoneal mass or adenopathy. Small scattered lymph nodes are stable.  Other: The bladder is distended and moderately irregular with small cellules, trabeculation and diverticuli. No mass or bladder wall thickening. The prostate gland and seminal vesicles are unremarkable. No pelvic mass or adenopathy. No free pelvic fluid collections. No inguinal mass or adenopathy.  Musculoskeletal: Advanced degenerative changes involving the lumbar spine but no acute bony findings or destructive bony changes.  IMPRESSION: 1. Stable emphysematous changes and stable numerous ill-defined ground-glass opacities in both lungs. There are also stable pulmonary nodules. No new or acute findings. 2. No mediastinal or hilar mass or lymphadenopathy. 3. Stable small liver lesions. 4. No abdominal/pelvic lymphadenopathy. 5. Stable advanced atherosclerotic calcifications involving the thoracic and abdominal aorta. 6. Cholecystostomy tube in place without complicating features. 7. Stable right-sided gynecomastia.   Electronically Signed   By: Marijo Sanes M.D.   On: 08/03/2015 16:07   Ct Abdomen Pelvis W Contrast  08/03/2015    CLINICAL DATA:  Restaging non small cell lung cancer. Initial diagnosis 2015  EXAM: CT CHEST, ABDOMEN, AND PELVIS WITH CONTRAST  TECHNIQUE: Multidetector CT imaging of the chest, abdomen and pelvis was performed following the standard protocol during bolus administration of intravenous contrast.  CONTRAST:  140mL OMNIPAQUE IOHEXOL 300 MG/ML  SOLN  COMPARISON:  CT scans 06/09/2015  FINDINGS: CT CHEST FINDINGS  Chest wall: Stable asymmetric right-sided gynecomastia measuring 32 x 12.5 mm. No supraclavicular or axillary lymphadenopathy. The bony thorax is intact. Numerous remote right-sided rib fractures are noted. No destructive bone lesions. Stable advanced degenerative changes involving the entire spine.  Mediastinum: The heart is normal in size. Small stable pericardial effusion. Stable advanced aortic calcifications without dissection or focal aneurysm. Stable three-vessel coronary artery calcifications. Small scattered stable mediastinal and hilar lymph nodes. No mass or overt adenopathy. The esophagus is grossly normal.  Lungs/pleura:  Stable advanced emphysematous changes.  Numerous ill-defined ground-glass opacities are noted in both lungs. These are difficult to actually measure but are grossly stable. No progressive findings or new findings. The left upper lobe density is stable at 13.5 x 6.5 mm. The right basilar atelectasis is much improved. No pleural effusion. Stable pleural calcification at the left lung base. Stable 3 mm nodule in the right middle lobe on image number 54 no acute pulmonary findings. No pleural effusion.  CT ABDOMEN AND PELVIS FINDINGS  Hepatobiliary: Stable small low-attenuation liver lesions, 1 is in segment 2 on image 59 and the other is in segment 5 on image number 79. There are also 2 hyperdense or enhancing nodules, exophytic 7 mm nodule in segment 2 on image 58 and 6.5 mm nodule in segment 8 on image number 57. The cholecystostomy catheter is in place. No complicating features.   Pancreas: No mass, inflammation or ductal dilatation.  Spleen: Normal size.  No focal lesions.  Adrenals/Urinary Tract: The adrenal glands and kidneys are unremarkable and stable. No findings for adrenal glands metastasis.  Stomach/Bowel: The stomach, duodenum, small bowel and colon are unremarkable. No inflammatory changes, mass lesions or obstructive findings. The terminal ileum is normal. The appendix is normal.  Vascular/Lymphatic: Advanced atherosclerotic calcifications involving the aorta and branch vessels but no focal aneurysm or dissection. The major venous structures are patent. No mesenteric or retroperitoneal mass or adenopathy. Small scattered lymph nodes are stable.  Other: The bladder is distended and moderately irregular with small cellules, trabeculation and diverticuli. No mass or  bladder wall thickening. The prostate gland and seminal vesicles are unremarkable. No pelvic mass or adenopathy. No free pelvic fluid collections. No inguinal mass or adenopathy.  Musculoskeletal: Advanced degenerative changes involving the lumbar spine but no acute bony findings or destructive bony changes.  IMPRESSION: 1. Stable emphysematous changes and stable numerous ill-defined ground-glass opacities in both lungs. There are also stable pulmonary nodules. No new or acute findings. 2. No mediastinal or hilar mass or lymphadenopathy. 3. Stable small liver lesions. 4. No abdominal/pelvic lymphadenopathy. 5. Stable advanced atherosclerotic calcifications involving the thoracic and abdominal aorta. 6. Cholecystostomy tube in place without complicating features. 7. Stable right-sided gynecomastia.   Electronically Signed   By: Marijo Sanes M.D.   On: 08/03/2015 16:07   Dg Sinus/fist Tube Chk-non Gi  07/21/2015   CLINICAL DATA:  74 year old male with stage IV non-small cell lung cancer and recent episode of acute calculus cholecystitis treated by percutaneous cholecystostomy tube placement on 06/13/2015. He presents  for cholangiogram through existing biliary tube.  EXAM: CHOLANGIOGRAM THROUGH EXISTING ACCESS  CONTRAST:  15 mL Omnipaque 300  FLUOROSCOPY TIME:  Radiation Exposure Index (as provided by the fluoroscopic device): 21 dGy  COMPARISON:  Cholecystostomy tube placement images 7 08/25/2015  FINDINGS: An initial spot image demonstrates the tube in the expected location in the right upper quadrant. Hand injection of contrast material was performed under real-time fluoroscopic imaging. The gallbladder is contracted. After sufficient pressurization, contrast material slowly fills the cystic duct. There is a focal filling defect in the cystic duct consistent with the presence of a small stone. This is incompletely occlusive. Contrast material eventually passes through the remainder the cystic duct and enters the common bile duct.  There is a second filling defect in the distal common bile duct consistent with choledocholithiasis. Again, this is not obstructing as contrast material does pass beyond this stone, through the ampulla and into the duodenum. The drain was capped.  IMPRESSION: 1. Cholelithiasis with an incompletely obstructing stone in the cystic duct. 2. Positive for choledocholithiasis. There is a filling defect in the distal common bile duct which is not occlusive. Contrast material passes around the stone and into the duodenum.   Electronically Signed   By: Jacqulynn Cadet M.D.   On: 07/21/2015 15:46   ASSESSMENT AND PLAN: This is a very pleasant 74 years old white male with:  1)  Stage IV non-small cell lung cancer, adenocarcinoma status post induction chemotherapy with carboplatin and Alimta and currently on maintenance treatment with single agent Alimta status post 8 cycles. He has been observation. He had evidence for disease progression on the left adrenal gland and the patient underwent palliative radiotherapy to the left adrenal gland under the care of Dr. Sondra Come completed on 12/16/2014. He is  currently undergoing treatment with immunotherapy with Nivolumab status post 14 cycles and tolerating his treatment fairly well. His recent CT scan of the chest, abdomen and pelvis showed no significant evidence for disease progression. The patient was discussed with and also seen by Dr. [name]. He will proceed with cycle # 14 today as scheduled. The patient will return for a follow-up visit in 2 weeks with the start of cycle #15.  2) acute cholecystitis: Currently has biliary drainage and he is expected to have cholecystectomy and few weeks.  3) Atrial fibrillation: He'll continue his current medication with Cardizem, digoxin and metoprolol in addition to Eliquis. He'll also continue his routine followup visit with his cardiologist Dr. [name].  4) history of hyponatremia: His  sodium is improving, now 133. He will continue taking his salt tablets as prescribed and we will continue to iscontinue his treatment with demeclocycline for now and try some salt tablets. We will continue to monitor his lab work closely.  He was advised to call immediately if he has any concerning symptoms in the interval. The patient voices understanding of current disease status and treatment options and is in agreement with the current care plan.  All questions were answered. The patient knows to call the clinic with any problems, questions or concerns. We can certainly see the patient much sooner if necessary.  Carlton Adam, PA-C 08/19/2015  ADDENDUM: Hematology/Oncology Attending: I had a face to face encounter with the patient. I recommended his care plan. This is a very pleasant 74 years old white male with metastatic non-small cell lung cancer, adenocarcinoma, currently undergoing treatment with immunotherapy with Nivolumab status post 13 cycles. The patient is doing fine and tolerating his treatment fairly well. He is expected to have cholecystectomy stone under the care of Dr. Dalbert Batman. I recommended for  the patient to continue his current immunotherapy with Nivolumab as a scheduled. He will receive cycle #14 today. We will see the patient back for follow-up visit in 2 weeks for reevaluation before starting cycle #15. The patient was advised to call immediately if he has any concerning symptoms in the interval.  Disclaimer: This note was dictated with voice recognition software. Similar sounding words can inadvertently be transcribed and may not be corrected upon review. Eilleen Kempf., MD 08/29/2015

## 2015-08-19 NOTE — Patient Instructions (Signed)
[facility] Discharge Instructions for Patients  Today you received the following: Nivolumab   To help prevent nausea and vomiting after your treatment, we encourage you to take your nausea medication as directed.    If you develop nausea and vomiting that is not controlled by your nausea medication, call the clinic.   BELOW ARE SYMPTOMS THAT SHOULD BE REPORTED IMMEDIATELY:  *FEVER GREATER THAN 100.5 F  *CHILLS WITH OR WITHOUT FEVER  NAUSEA AND VOMITING THAT IS NOT CONTROLLED WITH YOUR NAUSEA MEDICATION  *UNUSUAL SHORTNESS OF BREATH  *UNUSUAL BRUISING OR BLEEDING  TENDERNESS IN MOUTH AND THROAT WITH OR WITHOUT PRESENCE OF ULCERS  *URINARY PROBLEMS  *BOWEL PROBLEMS  UNUSUAL RASH Items with * indicate a potential emergency and should be followed up as soon as possible.  Feel free to call the clinic you have any questions or concerns. The clinic phone number is [phone].  Please show the CHEMO ALERT CARD at check-in to the Emergency Department and triage nurse.   

## 2015-08-25 NOTE — Patient Instructions (Signed)
Follow up in 2 weeks prior to your next scheduled cycle of immunotherapy 

## 2015-08-28 ENCOUNTER — Encounter (HOSPITAL_COMMUNITY): Payer: Self-pay | Admitting: *Deleted

## 2015-08-28 ENCOUNTER — Other Ambulatory Visit: Payer: Self-pay | Admitting: Gastroenterology

## 2015-08-28 NOTE — Addendum Note (Signed)
Addended by: Arta Silence on: 08/28/2015 03:53 PM   Modules accepted: Orders

## 2015-08-31 ENCOUNTER — Other Ambulatory Visit: Payer: Self-pay | Admitting: Gastroenterology

## 2015-08-31 NOTE — Addendum Note (Signed)
Addended by: Arta Silence on: 08/31/2015 10:45 AM   Modules accepted: Orders

## 2015-09-01 ENCOUNTER — Other Ambulatory Visit: Payer: Self-pay | Admitting: Gastroenterology

## 2015-09-01 ENCOUNTER — Encounter (HOSPITAL_COMMUNITY): Payer: Self-pay | Admitting: Anesthesiology

## 2015-09-01 NOTE — Anesthesia Preprocedure Evaluation (Addendum)
Anesthesia Evaluation  Patient identified by MRN, date of birth, ID band Patient awake    Reviewed: Allergy & Precautions, NPO status , Patient's Chart, lab work & pertinent test results  Airway Mallampati: II  TM Distance: >3 FB Neck ROM: Full    Dental no notable dental hx.    Pulmonary pneumonia, resolved, former smoker,  CXR reviewed: Emphysematous changes.   Pulmonary exam normal breath sounds clear to auscultation       Cardiovascular hypertension, Pt. on medications Normal cardiovascular exam+ dysrhythmias Atrial Fibrillation  Rhythm:Regular Rate:Normal  ECHO: EF 55-60%  ECG: SR with PVCs   Neuro/Psych PSYCHIATRIC DISORDERS negative neurological ROS     GI/Hepatic GERD  Medicated,(+)     substance abuse  alcohol use, H/O alcohol abuse.   Endo/Other  negative endocrine ROS  Renal/GU negative Renal ROS  negative genitourinary   Musculoskeletal  (+) Arthritis ,   Abdominal   Peds negative pediatric ROS (+)  Hematology  (+) anemia ,   Anesthesia Other Findings   Reproductive/Obstetrics negative OB ROS                         Anesthesia Physical Anesthesia Plan  ASA: III  Anesthesia Plan: General   Post-op Pain Management:    Induction: Intravenous  Airway Management Planned: Oral ETT  Additional Equipment:   Intra-op Plan:   Post-operative Plan: Extubation in OR  Informed Consent: I have reviewed the patients History and Physical, chart, labs and discussed the procedure including the risks, benefits and alternatives for the proposed anesthesia with the patient or authorized representative who has indicated his/her understanding and acceptance.   Dental advisory given  Plan Discussed with: CRNA  Anesthesia Plan Comments:         Anesthesia Quick Evaluation

## 2015-09-02 ENCOUNTER — Encounter (HOSPITAL_COMMUNITY): Payer: Self-pay

## 2015-09-02 ENCOUNTER — Ambulatory Visit (HOSPITAL_COMMUNITY): Payer: Medicare Other

## 2015-09-02 ENCOUNTER — Ambulatory Visit (HOSPITAL_COMMUNITY): Payer: Medicare Other | Admitting: Anesthesiology

## 2015-09-02 ENCOUNTER — Ambulatory Visit: Payer: Medicare Other

## 2015-09-02 ENCOUNTER — Other Ambulatory Visit: Payer: Medicare Other

## 2015-09-02 ENCOUNTER — Ambulatory Visit: Payer: Medicare Other | Admitting: Internal Medicine

## 2015-09-02 ENCOUNTER — Encounter (HOSPITAL_COMMUNITY)
Admission: RE | Disposition: A | Discharge status: Home / Self Care | Payer: Self-pay | Source: Ambulatory Visit | Attending: Gastroenterology

## 2015-09-02 ENCOUNTER — Encounter: Payer: Self-pay | Admitting: *Deleted

## 2015-09-02 ENCOUNTER — Ambulatory Visit (HOSPITAL_COMMUNITY)
Admission: RE | Admit: 2015-09-02 | Discharge: 2015-09-02 | Disposition: A | Discharge status: Home / Self Care | Payer: Medicare Other | Source: Ambulatory Visit | Attending: Gastroenterology | Admitting: Gastroenterology

## 2015-09-02 DIAGNOSIS — K8062 Calculus of gallbladder and bile duct with acute cholecystitis without obstruction: Secondary | ICD-10-CM | POA: Insufficient documentation

## 2015-09-02 DIAGNOSIS — K219 Gastro-esophageal reflux disease without esophagitis: Secondary | ICD-10-CM | POA: Diagnosis not present

## 2015-09-02 DIAGNOSIS — Z96612 Presence of left artificial shoulder joint: Secondary | ICD-10-CM | POA: Diagnosis not present

## 2015-09-02 DIAGNOSIS — R933 Abnormal findings on diagnostic imaging of other parts of digestive tract: Secondary | ICD-10-CM | POA: Diagnosis not present

## 2015-09-02 DIAGNOSIS — M1990 Unspecified osteoarthritis, unspecified site: Secondary | ICD-10-CM | POA: Insufficient documentation

## 2015-09-02 DIAGNOSIS — K8050 Calculus of bile duct without cholangitis or cholecystitis without obstruction: Secondary | ICD-10-CM | POA: Diagnosis not present

## 2015-09-02 DIAGNOSIS — C3490 Malignant neoplasm of unspecified part of unspecified bronchus or lung: Secondary | ICD-10-CM | POA: Insufficient documentation

## 2015-09-02 DIAGNOSIS — I10 Essential (primary) hypertension: Secondary | ICD-10-CM | POA: Diagnosis not present

## 2015-09-02 DIAGNOSIS — Z7901 Long term (current) use of anticoagulants: Secondary | ICD-10-CM | POA: Diagnosis not present

## 2015-09-02 DIAGNOSIS — Z79899 Other long term (current) drug therapy: Secondary | ICD-10-CM | POA: Insufficient documentation

## 2015-09-02 DIAGNOSIS — E871 Hypo-osmolality and hyponatremia: Secondary | ICD-10-CM | POA: Diagnosis not present

## 2015-09-02 DIAGNOSIS — K8020 Calculus of gallbladder without cholecystitis without obstruction: Secondary | ICD-10-CM | POA: Diagnosis not present

## 2015-09-02 DIAGNOSIS — Z87891 Personal history of nicotine dependence: Secondary | ICD-10-CM | POA: Diagnosis not present

## 2015-09-02 DIAGNOSIS — I4891 Unspecified atrial fibrillation: Secondary | ICD-10-CM | POA: Diagnosis not present

## 2015-09-02 DIAGNOSIS — N400 Enlarged prostate without lower urinary tract symptoms: Secondary | ICD-10-CM | POA: Diagnosis not present

## 2015-09-02 DIAGNOSIS — R1011 Right upper quadrant pain: Secondary | ICD-10-CM | POA: Diagnosis not present

## 2015-09-02 DIAGNOSIS — Z79891 Long term (current) use of opiate analgesic: Secondary | ICD-10-CM | POA: Diagnosis not present

## 2015-09-02 DIAGNOSIS — M19012 Primary osteoarthritis, left shoulder: Secondary | ICD-10-CM | POA: Insufficient documentation

## 2015-09-02 HISTORY — PX: ERCP: SHX5425

## 2015-09-02 SURGERY — ERCP, WITH INTERVENTION IF INDICATED
Anesthesia: General

## 2015-09-02 MED ORDER — LIDOCAINE HCL (CARDIAC) 20 MG/ML IV SOLN
INTRAVENOUS | Status: DC | PRN
  Administered 2015-09-02: 80 mg via INTRAVENOUS

## 2015-09-02 MED ORDER — CIPROFLOXACIN IN D5W 400 MG/200ML IV SOLN
400.0000 mg | Freq: Once | INTRAVENOUS | Status: AC
  Administered 2015-09-02: 400 mg via INTRAVENOUS

## 2015-09-02 MED ORDER — SUCCINYLCHOLINE CHLORIDE 20 MG/ML IJ SOLN
INTRAMUSCULAR | Status: DC | PRN
  Administered 2015-09-02: 100 mg via INTRAVENOUS

## 2015-09-02 MED ORDER — GLUCAGON HCL RDNA (DIAGNOSTIC) 1 MG IJ SOLR
INTRAMUSCULAR | Status: AC
  Filled 2015-09-02: qty 1

## 2015-09-02 MED ORDER — LACTATED RINGERS IV SOLN
INTRAVENOUS | Status: DC
  Administered 2015-09-02: 1000 mL via INTRAVENOUS

## 2015-09-02 MED ORDER — SODIUM CHLORIDE 0.9 % IV SOLN: INTRAVENOUS | Status: DC

## 2015-09-02 MED ORDER — SODIUM CHLORIDE 0.9 % IV SOLN
INTRAVENOUS | Status: DC | PRN
  Administered 2015-09-02: 11:00:00

## 2015-09-02 MED ORDER — EPHEDRINE SULFATE 50 MG/ML IJ SOLN
INTRAMUSCULAR | Status: DC | PRN
  Administered 2015-09-02 (×3): 5 mg via INTRAVENOUS

## 2015-09-02 MED ORDER — PROPOFOL 10 MG/ML IV BOLUS
INTRAVENOUS | Status: DC | PRN
  Administered 2015-09-02: 140 mg via INTRAVENOUS

## 2015-09-02 MED ORDER — LIDOCAINE HCL (CARDIAC) 20 MG/ML IV SOLN
INTRAVENOUS | Status: AC
  Filled 2015-09-02: qty 5

## 2015-09-02 MED ORDER — ONDANSETRON HCL 4 MG/2ML IJ SOLN
INTRAMUSCULAR | Status: DC | PRN
  Administered 2015-09-02: 4 mg via INTRAVENOUS

## 2015-09-02 MED ORDER — GLUCAGON HCL RDNA (DIAGNOSTIC) 1 MG IJ SOLR
INTRAMUSCULAR | Status: DC | PRN
  Administered 2015-09-02 (×2): .5 mg via INTRAVENOUS

## 2015-09-02 MED ORDER — SODIUM CHLORIDE 0.9 % IJ SOLN
INTRAMUSCULAR | Status: AC
  Filled 2015-09-02: qty 10

## 2015-09-02 MED ORDER — EPHEDRINE SULFATE 50 MG/ML IJ SOLN
INTRAMUSCULAR | Status: AC
  Filled 2015-09-02: qty 1

## 2015-09-02 MED ORDER — PROPOFOL 10 MG/ML IV BOLUS
INTRAVENOUS | Status: AC
  Filled 2015-09-02: qty 20

## 2015-09-02 MED ORDER — ONDANSETRON HCL 4 MG/2ML IJ SOLN
INTRAMUSCULAR | Status: AC
  Filled 2015-09-02: qty 2

## 2015-09-02 MED ORDER — CIPROFLOXACIN IN D5W 400 MG/200ML IV SOLN
INTRAVENOUS | Status: AC
  Filled 2015-09-02: qty 200

## 2015-09-02 NOTE — Progress Notes (Signed)
Oncology Nurse Navigator Documentation  Oncology Nurse Navigator Flowsheets 09/02/2015  Navigator Encounter Type Other/spoke with patient in chemo today.  He stated he is doing much better and has no complaints today.  It was great seeing Anthony Curry looking and feeling better.   Patient Visit Type Follow-up  Treatment Phase Treatment  Barriers/Navigation Needs No barriers at this time  Time Spent with Patient 15

## 2015-09-02 NOTE — Progress Notes (Signed)
Late entry: Following ERCP procedure pt stated heaviness in his chest. Dr. Paulita Fujita and Dr. Delma Post were notified. MD ordered 12 lead EKG and both doctors looked at results which were NSR with 1st degree block. Dr. Paulita Fujita suggested Maalox OTC if symptoms do not improve.

## 2015-09-02 NOTE — H&P (View-Only) (Signed)
[facility] [phone]) [phone]   Fax:(336) [place], [place] [address]  DIAGNOSIS: Stage IV (T1 A., N0, M1 B.) non-small cell lung cancer, adenocarcinoma with negative EGFR mutation and negative ALK gene translocation diagnosed in October of 2014.   Molecular profile: Negative for RET, ALK, BRAF, MET, EGFR.  Positive for ERBB2 A775T, NFA2Z308M, KRAS G13E, MAP2K1 D67N (see full report).   PRIOR THERAPY:  1) Systemic chemotherapy with carboplatin for AUC of 5 and Alimta 500 mg/M2 every 3 weeks, status post 6 cycles with stable disease. First dose on 10/23/2013. 2) Maintenance systemic chemotherapy with single agent Alimta 500 mg/M2 every 3 weeks. Status post 8 cycles.   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 Sondra Come completed on 12/16/2014.  CURRENT THERAPY: Immunotherapy with Nivolumab 3 MG/KG every 2 weeks, status post 12 cycles. First cycle 01/28/2015  CHEMOTHERAPY INTENT: Palliative  CURRENT # OF CHEMOTHERAPY CYCLES: 13 CURRENT ANTIEMETICS: Zofran, Compazine and dexamethasone  CURRENT SMOKING STATUS: Former smoker  ORAL CHEMOTHERAPY AND CONSENT: None  CURRENT BISPHOSPHONATES USE: None  PAIN MANAGEMENT: 0/10  NARCOTICS INDUCED CONSTIPATION: None  LIVING WILL AND CODE STATUS: ?   INTERVAL HISTORY: Anthony Curry 74 y.o. male returns to the clinic today for followup visit accompanied by his wife. He is tolerating his current treatment with immunotherapy with Nivolumab fairly well with no significant adverse effects. He was seen recently by Dr. Dalbert Batman and Dr. Paulita Fujita. There are working together on coordinating ERCP as well as cholecystectomy but no date has been scheduled yet. He denied having any significant chest pain, shortness of breath, cough or hemoptysis. He denied having any significant fever or chills, no nausea or vomiting. He  is currently on demeclocycline for hyponatremia and he wonders if he can switch her to salt tablets for a short period to see if it will help. He had repeat CT scan of the chest, abdomen and pelvis performed recently and he is here for evaluation and discussion of his scan results.  MEDICAL HISTORY: Past Medical History  Diagnosis Date  . Hypertension   . GERD (gastroesophageal reflux disease)   . Alcohol abuse   . 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 Low sodium   . Persistent atrial fibrillation 04/11/14    chads2vasc score is 2  . Hypotension   . Atrial fibrillation with RVR   . HCAP (healthcare-associated pneumonia)   . Adrenal hemorrhage   . BPH (benign prostatic hyperplasia)   . Hyponatremia   . Anemia of chronic disease   . Fall   . Alcohol intoxication   . Pulmonary nodules   . Adrenal mass   . Atrial flutter by electrocardiogram   . 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 Alcoholism in remission   . Radiation 12/02/14-12/16/14    Left adrenal gland 30 Gy in 10 fractions  . Cancer     small cell/lung  . Neoplastic malignant related fatigue     ALLERGIES:  is allergic to aspirin.  MEDICATIONS:  Current Outpatient Prescriptions  Medication Sig Dispense Refill  . ALPRAZolam (XANAX) 0.25 MG tablet Take 0.25 mg by mouth 3 (three) times daily as needed for anxiety or sleep.    [name] apixaban (ELIQUIS) 5 MG TABS tablet Take 1 tablet (5 mg total) by mouth 2 (two) times daily. 180 tablet 0  . Ascorbic Acid (VITAMIN C PO) Take  1,000 mg by mouth daily.     . B Complex-C (SUPER B COMPLEX PO) Take 1 each by mouth daily.     . calcium carbonate (OS-CAL) 600 MG TABS Take 600 mg by mouth daily with breakfast.    . demeclocycline (DECLOMYCIN) 150 MG tablet Take 150 mg by mouth 2 (two) times daily.    [name] diltiazem (CARDIZEM CD) 180 MG 24 hr capsule Take 1 capsule (180 mg total) by mouth daily at 12 noon. 90 capsule 3  . furosemide (LASIX) 40 MG tablet Take 0.5 tablets  (20 mg total) by mouth daily. 90 tablet 3  . HYDROcodone-acetaminophen (NORCO/VICODIN) 5-325 MG per tablet Take 1-2 tablets by mouth every 4 (four) hours as needed for moderate pain. 30 tablet 0  . KLOR-CON M20 20 MEQ tablet Take 20 mEq by mouth daily.     . Lactobacillus (ACIDOPHILUS) CAPS capsule Take 1 capsule by mouth daily.    [name] MAGNESIUM PO Take 400 mg by mouth daily.     . Multiple Vitamin (MULTIVITAMIN WITH MINERALS) TABS Take 1 tablet by mouth daily.    . Nivolumab (OPDIVO IV) Inject as directed every 2 weeks    . Nutritional Supplements (ENSURE COMPLETE PO) Take 1 Can by mouth 2 (two) times daily.    . Omega-3 Fatty Acids (FISH OIL PO) Take 1 tablet by mouth daily.    . pantoprazole (PROTONIX) 40 MG tablet Take 40 mg by mouth every morning.  8  . tamsulosin (FLOMAX) 0.4 MG CAPS capsule Take 0.4 mg by mouth daily.     [name] thiamine (VITAMIN B-1) 100 MG tablet Take 1 tablet (100 mg total) by mouth daily. 30 tablet 6  . vitamin E 400 UNIT capsule Take 400 Units by mouth daily.    . mirtazapine (REMERON) 15 MG tablet Take 1 tablet (15 mg total) by mouth at bedtime. (Patient not taking: Reported on 08/05/2015) 30 tablet 0  . ondansetron (ZOFRAN ODT) 8 MG disintegrating tablet Take 1 tablet (8 mg total) by mouth every 8 (eight) hours as needed for nausea. (Patient not taking: Reported on 07/30/2015) 20 tablet 0   No current facility-administered medications for this visit.    SURGICAL HISTORY:  Past Surgical History  Procedure Laterality Date  . Hemorrhoid surgery    . Ankle arthrodesis  1995    rt fx-hardware in  . Tonsillectomy    . Colonoscopy    . Excision / curettage bone cyst finger  2005    rt thumb  . Total shoulder arthroplasty  02/03/2012    Procedure: TOTAL SHOULDER ARTHROPLASTY;  Surgeon: [name], MD;  Location: [facility];  Service: Orthopedics;  Laterality: Left;  . Cardioversion N/A 09/29/2014    Procedure: CARDIOVERSION;  Surgeon: Josue Hector,  MD;  Location: [facility] ENDOSCOPY;  Service: Cardiovascular;  Laterality: N/A;    REVIEW OF SYSTEMS:  Constitutional: negative Eyes: negative Ears, nose, mouth, throat, and face: negative Respiratory: negative Cardiovascular: negative Gastrointestinal: negative Genitourinary:negative Integument/breast: negative Hematologic/lymphatic: negative Musculoskeletal:negative Neurological: negative Behavioral/Psych: negative Endocrine: negative Allergic/Immunologic: negative   PHYSICAL EXAMINATION: General appearance: alert, cooperative, fatigued and no distress Head: Normocephalic, without obvious abnormality, atraumatic Neck: no adenopathy, no JVD, supple, symmetrical, trachea midline and thyroid not enlarged, symmetric, no tenderness/mass/nodules Lymph nodes: Cervical, supraclavicular, and axillary nodes normal. Resp: clear to auscultation bilaterally Back: symmetric, no curvature. ROM normal. No CVA tenderness. Cardio: regular rate and rhythm, S1, S2 normal, no murmur, click, rub or gallop GI: Right upper  quadrant tenderness to palpation Extremities: extremities normal, atraumatic, no cyanosis or edema Neurologic: Alert and oriented X 3, normal strength and tone. Normal symmetric reflexes. Normal coordination and gait  ECOG PERFORMANCE STATUS: 1 - Symptomatic but completely ambulatory  Blood pressure 147/66, pulse 66, temperature 98 F (36.7 C), temperature source Oral, resp. rate 17, height 6' (1.829 m), weight 156 lb 8 oz (70.988 kg), SpO2 98 %.  LABORATORY DATA: Lab Results  Component Value Date   WBC 4.4 08/05/2015   HGB 13.8 08/05/2015   HCT 40.2 08/05/2015   MCV 93.9 08/05/2015   PLT 265 08/05/2015      Chemistry      Component Value Date/Time   NA 133* 07/22/2015 1055   NA 129* 06/15/2015 0519   K 3.9 07/22/2015 1055   K 4.1 06/15/2015 0519   CL 92* 06/15/2015 0519   CO2 24 07/22/2015 1055   CO2 29 06/15/2015 0519   BUN 12.6 07/22/2015 1055   BUN 10 06/15/2015 0519     CREATININE 0.8 07/22/2015 1055   CREATININE 0.95 06/15/2015 0519      Component Value Date/Time   CALCIUM 9.5 07/22/2015 1055   CALCIUM 8.9 06/15/2015 0519   ALKPHOS 93 07/22/2015 1055   ALKPHOS 68 06/13/2015 0530   AST 23 07/22/2015 1055   AST 20 06/13/2015 0530   ALT 17 07/22/2015 1055   ALT 16* 06/13/2015 0530   BILITOT 0.72 07/22/2015 1055   BILITOT 0.7 06/13/2015 0530       RADIOGRAPHIC STUDIES: Ct Chest W Contrast  08/03/2015   CLINICAL DATA:  Restaging non small cell lung cancer. Initial diagnosis 2015  EXAM: CT CHEST, ABDOMEN, AND PELVIS WITH CONTRAST  TECHNIQUE: Multidetector CT imaging of the chest, abdomen and pelvis was performed following the standard protocol during bolus administration of intravenous contrast.  CONTRAST:  146m OMNIPAQUE IOHEXOL 300 MG/ML  SOLN  COMPARISON:  CT scans 06/09/2015  FINDINGS: CT CHEST FINDINGS  Chest wall: Stable asymmetric right-sided gynecomastia measuring 32 x 12.5 mm. No supraclavicular or axillary lymphadenopathy. The bony thorax is intact. Numerous remote right-sided rib fractures are noted. No destructive bone lesions. Stable advanced degenerative changes involving the entire spine.  Mediastinum: The heart is normal in size. Small stable pericardial effusion. Stable advanced aortic calcifications without dissection or focal aneurysm. Stable three-vessel coronary artery calcifications. Small scattered stable mediastinal and hilar lymph nodes. No mass or overt adenopathy. The esophagus is grossly normal.  Lungs/pleura:  Stable advanced emphysematous changes.  Numerous ill-defined ground-glass opacities are noted in both lungs. These are difficult to actually measure but are grossly stable. No progressive findings or new findings. The left upper lobe density is stable at 13.5 x 6.5 mm. The right basilar atelectasis is much improved. No pleural effusion. Stable pleural calcification at the left lung base. Stable 3 mm nodule in the right middle  lobe on image number 54 no acute pulmonary findings. No pleural effusion.  CT ABDOMEN AND PELVIS FINDINGS  Hepatobiliary: Stable small low-attenuation liver lesions, 1 is in segment 2 on image 59 and the other is in segment 5 on image number 79. There are also 2 hyperdense or enhancing nodules, exophytic 7 mm nodule in segment 2 on image 58 and 6.5 mm nodule in segment 8 on image number 57. The cholecystostomy catheter is in place. No complicating features.  Pancreas: No mass, inflammation or ductal dilatation.  Spleen: Normal size.  No focal lesions.  Adrenals/Urinary Tract: The adrenal glands and kidneys are unremarkable  and stable. No findings for adrenal glands metastasis.  Stomach/Bowel: The stomach, duodenum, small bowel and colon are unremarkable. No inflammatory changes, mass lesions or obstructive findings. The terminal ileum is normal. The appendix is normal.  Vascular/Lymphatic: Advanced atherosclerotic calcifications involving the aorta and branch vessels but no focal aneurysm or dissection. The major venous structures are patent. No mesenteric or retroperitoneal mass or adenopathy. Small scattered lymph nodes are stable.  Other: The bladder is distended and moderately irregular with small cellules, trabeculation and diverticuli. No mass or bladder wall thickening. The prostate gland and seminal vesicles are unremarkable. No pelvic mass or adenopathy. No free pelvic fluid collections. No inguinal mass or adenopathy.  Musculoskeletal: Advanced degenerative changes involving the lumbar spine but no acute bony findings or destructive bony changes.  IMPRESSION: 1. Stable emphysematous changes and stable numerous ill-defined ground-glass opacities in both lungs. There are also stable pulmonary nodules. No new or acute findings. 2. No mediastinal or hilar mass or lymphadenopathy. 3. Stable small liver lesions. 4. No abdominal/pelvic lymphadenopathy. 5. Stable advanced atherosclerotic calcifications involving  the thoracic and abdominal aorta. 6. Cholecystostomy tube in place without complicating features. 7. Stable right-sided gynecomastia.   Electronically Signed   By: Marijo Sanes M.D.   On: 08/03/2015 16:07   Ct Abdomen Pelvis W Contrast  08/03/2015   CLINICAL DATA:  Restaging non small cell lung cancer. Initial diagnosis 2015  EXAM: CT CHEST, ABDOMEN, AND PELVIS WITH CONTRAST  TECHNIQUE: Multidetector CT imaging of the chest, abdomen and pelvis was performed following the standard protocol during bolus administration of intravenous contrast.  CONTRAST:  111m OMNIPAQUE IOHEXOL 300 MG/ML  SOLN  COMPARISON:  CT scans 06/09/2015  FINDINGS: CT CHEST FINDINGS  Chest wall: Stable asymmetric right-sided gynecomastia measuring 32 x 12.5 mm. No supraclavicular or axillary lymphadenopathy. The bony thorax is intact. Numerous remote right-sided rib fractures are noted. No destructive bone lesions. Stable advanced degenerative changes involving the entire spine.  Mediastinum: The heart is normal in size. Small stable pericardial effusion. Stable advanced aortic calcifications without dissection or focal aneurysm. Stable three-vessel coronary artery calcifications. Small scattered stable mediastinal and hilar lymph nodes. No mass or overt adenopathy. The esophagus is grossly normal.  Lungs/pleura:  Stable advanced emphysematous changes.  Numerous ill-defined ground-glass opacities are noted in both lungs. These are difficult to actually measure but are grossly stable. No progressive findings or new findings. The left upper lobe density is stable at 13.5 x 6.5 mm. The right basilar atelectasis is much improved. No pleural effusion. Stable pleural calcification at the left lung base. Stable 3 mm nodule in the right middle lobe on image number 54 no acute pulmonary findings. No pleural effusion.  CT ABDOMEN AND PELVIS FINDINGS  Hepatobiliary: Stable small low-attenuation liver lesions, 1 is in segment 2 on image 59 and the other  is in segment 5 on image number 79. There are also 2 hyperdense or enhancing nodules, exophytic 7 mm nodule in segment 2 on image 58 and 6.5 mm nodule in segment 8 on image number 57. The cholecystostomy catheter is in place. No complicating features.  Pancreas: No mass, inflammation or ductal dilatation.  Spleen: Normal size.  No focal lesions.  Adrenals/Urinary Tract: The adrenal glands and kidneys are unremarkable and stable. No findings for adrenal glands metastasis.  Stomach/Bowel: The stomach, duodenum, small bowel and colon are unremarkable. No inflammatory changes, mass lesions or obstructive findings. The terminal ileum is normal. The appendix is normal.  Vascular/Lymphatic: Advanced atherosclerotic calcifications involving  the aorta and branch vessels but no focal aneurysm or dissection. The major venous structures are patent. No mesenteric or retroperitoneal mass or adenopathy. Small scattered lymph nodes are stable.  Other: The bladder is distended and moderately irregular with small cellules, trabeculation and diverticuli. No mass or bladder wall thickening. The prostate gland and seminal vesicles are unremarkable. No pelvic mass or adenopathy. No free pelvic fluid collections. No inguinal mass or adenopathy.  Musculoskeletal: Advanced degenerative changes involving the lumbar spine but no acute bony findings or destructive bony changes.  IMPRESSION: 1. Stable emphysematous changes and stable numerous ill-defined ground-glass opacities in both lungs. There are also stable pulmonary nodules. No new or acute findings. 2. No mediastinal or hilar mass or lymphadenopathy. 3. Stable small liver lesions. 4. No abdominal/pelvic lymphadenopathy. 5. Stable advanced atherosclerotic calcifications involving the thoracic and abdominal aorta. 6. Cholecystostomy tube in place without complicating features. 7. Stable right-sided gynecomastia.   Electronically Signed   By: Marijo Sanes M.D.   On: 08/03/2015 16:07    Dg Sinus/fist Tube Chk-non Gi  07/21/2015   CLINICAL DATA:  74 year old male with stage IV non-small cell lung cancer and recent episode of acute calculus cholecystitis treated by percutaneous cholecystostomy tube placement on 06/13/2015. He presents for cholangiogram through existing biliary tube.  EXAM: CHOLANGIOGRAM THROUGH EXISTING ACCESS  CONTRAST:  15 mL Omnipaque 300  FLUOROSCOPY TIME:  Radiation Exposure Index (as provided by the fluoroscopic device): 21 dGy  COMPARISON:  Cholecystostomy tube placement images 7 08/25/2015  FINDINGS: An initial spot image demonstrates the tube in the expected location in the right upper quadrant. Hand injection of contrast material was performed under real-time fluoroscopic imaging. The gallbladder is contracted. After sufficient pressurization, contrast material slowly fills the cystic duct. There is a focal filling defect in the cystic duct consistent with the presence of a small stone. This is incompletely occlusive. Contrast material eventually passes through the remainder the cystic duct and enters the common bile duct.  There is a second filling defect in the distal common bile duct consistent with choledocholithiasis. Again, this is not obstructing as contrast material does pass beyond this stone, through the ampulla and into the duodenum. The drain was capped.  IMPRESSION: 1. Cholelithiasis with an incompletely obstructing stone in the cystic duct. 2. Positive for choledocholithiasis. There is a filling defect in the distal common bile duct which is not occlusive. Contrast material passes around the stone and into the duodenum.   Electronically Signed   By: Jacqulynn Cadet M.D.   On: 07/21/2015 15:46   ASSESSMENT AND PLAN: This is a very pleasant 74 years old white male with:  1)  Stage IV non-small cell lung cancer, adenocarcinoma status post induction chemotherapy with carboplatin and Alimta and currently on maintenance treatment with single agent Alimta  status post 8 cycles. He has been observation for the last few months.  He had evidence for disease progression on the left adrenal gland and the patient underwent palliative radiotherapy to the left adrenal gland under the care of Dr. Sondra Come completed on 12/16/2014. He is currently undergoing treatment with immunotherapy with Nivolumab status post 12 cycles and tolerating his treatment fairly well. His recent CT scan of the chest, abdomen and pelvis showed no significant evidence for disease progression. I discussed the scan results with the patient and his wife. I recommended for him to continue his current treatment with immunotherapy and he will proceed with cycle #13 today as scheduled. The patient would come back for  follow-up visit in 2 weeks with the start of cycle #14.  2) acute cholecystitis: Currently has biliary drainage and he is expected to have cholecystectomy and few weeks.  3) Atrial fibrillation: He'll continue his current medication with Cardizem, digoxin and metoprolol in addition to Eliquis. He'll also continue his routine followup visit with his cardiologist Dr. [name].  4) history of hyponatremia: He will discontinue his treatment with demeclocycline for now and try some salt tablets. We will continue to monitor his lab work closely.  He was advised to call immediately if he has any concerning symptoms in the interval. The patient voices understanding of current disease status and treatment options and is in agreement with the current care plan.  All questions were answered. The patient knows to call the clinic with any problems, questions or concerns. We can certainly see the patient much sooner if necessary.  Disclaimer: This note was dictated with voice recognition software. Similar sounding words can inadvertently be transcribed and may not be corrected upon review.

## 2015-09-02 NOTE — Anesthesia Procedure Notes (Signed)
Procedure Name: Intubation Date/Time: 09/02/2015 8:56 AM Performed by: Dione Booze Pre-anesthesia Checklist: Emergency Drugs available, Suction available and Patient identified Patient Re-evaluated:Patient Re-evaluated prior to inductionOxygen Delivery Method: Circle system utilized Preoxygenation: Pre-oxygenation with 100% oxygen Intubation Type: IV induction Laryngoscope Size: Mac and 4 Grade View: Grade I Tube type: Oral Tube size: 7.5 mm Number of attempts: 1 Airway Equipment and Method: Stylet Placement Confirmation: ETT inserted through vocal cords under direct vision and positive ETCO2 Secured at: 20 cm Tube secured with: Tape Dental Injury: Teeth and Oropharynx as per pre-operative assessment

## 2015-09-02 NOTE — Anesthesia Postprocedure Evaluation (Addendum)
  Anesthesia Post-op Note  Patient: Anthony Curry  Procedure(s) Performed: Procedure(s) (LRB): ENDOSCOPIC RETROGRADE CHOLANGIOPANCREATOGRAPHY (ERCP) (N/A)  Patient Location: PACU  Anesthesia Type: General  Level of Consciousness: awake and alert   Airway and Oxygen Therapy: Patient Spontanous Breathing  Post-op Pain: mild  Post-op Assessment: Post-op Vital signs reviewed, Patient's Cardiovascular Status Stable, Respiratory Function Stable, Patent Airway and No signs of Nausea or vomiting  Last Vitals:  Filed Vitals:   09/02/15 1200  BP: 147/53  Pulse: 59  Temp:   Resp: 11    Post-op Vital Signs: stable   Complications: No apparent anesthesia complications. C/O upper epigastric pain in PACU. No dyspnea. No nausea. Pain was difficult to describe, but he felt it might be like indigestion. ECG was obtained and did not show obvious ischemic changes. Carbonated soft drink given with improvement in symptoms. Discussed with Dr. Paulita Fujita and patient. Seems like indigestion symptoms. Symptoms have mostly resolved at time of discharge. Patient to return to ER if he experiences increased or different symptoms.

## 2015-09-02 NOTE — Transfer of Care (Signed)
Immediate Anesthesia Transfer of Care Note  Patient: Anthony Curry  Procedure(s) Performed: Procedure(s): ENDOSCOPIC RETROGRADE CHOLANGIOPANCREATOGRAPHY (ERCP) (N/A)  Patient Location: PACU and Endoscopy Unit  Anesthesia Type:General  Level of Consciousness: awake and patient cooperative  Airway & Oxygen Therapy: Patient Spontanous Breathing and Patient connected to face mask oxygen  Post-op Assessment: Report given to RN, Post -op Vital signs reviewed and stable and Patient moving all extremities X 4  Post vital signs: Reviewed and stable  Last Vitals:  Filed Vitals:   09/02/15 0719  BP: 151/67  Pulse: 57  Temp: 36.5 C  Resp: 12    Complications: No apparent anesthesia complications

## 2015-09-02 NOTE — Op Note (Signed)
[facility] [place], [address]   ERCP PROCEDURE REPORT        EXAM DATE: 09/02/2015  PATIENT NAME:          Anthony Curry, Anthony Curry          MR #: [id number] BIRTHDATE:       1941/10/30     VISIT #:     [id number] ATTENDING:     Arta Silence, MD     STATUS:     outpatient REFERRING MD:       Fanny Skates, M.D.  INDICATIONS:  The patient is a 74 yr old male here for an ERCP due to bile duct stone. PROCEDURE PERFORMED:     ERCP with stent placement MEDICATIONS:     general endotracheal anesthesia; ciprofloxacin 400 mg IV, glucagon 1 mg IV  CONSENT: The patient understands the risks and benefits of the procedure and understands that these risks include, but are not limited to: sedation, allergic reaction, infection, perforation and/or bleeding. Alternative means of evaluation and treatment include, among others: physical exam, x-rays, and/or surgical intervention. The patient elects to proceed with this endoscopic procedure.  DESCRIPTION OF PROCEDURE: Informed was verified, confirmed and timeout was successfully executed by the treatment team. With the patient in left semi-prone position, medications were administered intravenously.The side-viewing duodenoscope      was passed from the mouth into the esophagus and further advanced from the esophagus into the stomach. From stomach scope was directed to the second portion of the duodenum.  Major papilla was aligned with the duodenoscope. The scope position was confirmed fluoroscopically.  Pancreatic duct was preferentially cannulated, necessitating passage of 5Fr, 3cm pigtail pancreatic duct stent.  Still cannulated the pancreatic duct.  Small needle knife papillotomy was performed over the stent. Still cannulated the pancreatic duct.  Multiple different angles and cannulas and wire sizes were tried, all without success. Procedure terminated.      ADVERSE EVENT:     None IMPRESSIONS:     As  above.  Unable to cannulate common bile duct, despite extensive efforts, as described above.   Pancreatic duct stent was placed.  RECOMMENDATIONS:     1.  Watch for potential complications of procedure. 2.  Retry ERCP after needle knife papillotomy has had time to mature, in a couple weeks.  Would try to coordinate to be done in Interventional Radiology with potential cholangiogram to be done through biliary drain, to possibly help facilitate bile duct cannulation. 3.  Hold Eliquis for another couple days. 4.  Will discuss with Dr. Dalbert Batman. REPEAT EXAM:   ___________________________________ Arta Silence, MD eSigned:  Arta Silence, MD 09/02/2015 10:54 AM   cc:

## 2015-09-02 NOTE — Discharge Instructions (Signed)
Endoscopic Retrograde Cholangiopancreatography (ERCP), Care After °Refer to this sheet in the next few weeks. These instructions provide you with information on caring for yourself after your procedure. Your health care provider may also give you more specific instructions. Your treatment has been planned according to current medical practices, but problems sometimes occur. Call your health care provider if you have any problems or questions after your procedure.  °WHAT TO EXPECT AFTER THE PROCEDURE  °After your procedure, it is typical to feel:  °· Soreness in your throat.   °· Sick to your stomach (nauseous).   °· Bloated. °· Dizzy.   °· Fatigued. °HOME CARE INSTRUCTIONS °· Have a friend or family member stay with you for the first 24 hours after your procedure. °· Start taking your usual medicines and eating normally as soon as you feel well enough to do so or as directed by your health care provider. °SEEK MEDICAL CARE IF: °· You have abdominal pain.   °· You develop signs of infection, such as:   °¨ Chills.   °¨ Feeling unwell.   °SEEK IMMEDIATE MEDICAL CARE IF: °· You have difficulty swallowing. °· You have worsening throat, chest, or abdominal pain. °· You vomit. °· You have bloody or very black stools. °· You have a fever. °Document Released: 09/11/2013 Document Reviewed: 09/11/2013 °ExitCare® Patient Information ©2015 [employer]. This information is not intended to replace advice given to you by your health care provider. Make sure you discuss any questions you have with your health care provider. ° °

## 2015-09-02 NOTE — Interval H&P Note (Signed)
History and Physical Interval Note:  09/02/2015 8:45 AM  Anthony Curry  has presented today for surgery, with the diagnosis of CBD stone, gallstones  The various methods of treatment have been discussed with the patient and family. After consideration of risks, benefits and other options for treatment, the patient has consented to  Procedure(s): ENDOSCOPIC RETROGRADE CHOLANGIOPANCREATOGRAPHY (ERCP) (N/A) as a surgical intervention .  The patient's history has been reviewed, patient examined, no change in status, stable for surgery.  I have reviewed the patient's chart and labs.  Questions were answered to the patient's satisfaction.     Ceili Boshers M  Assessment:  1.  Bile duct stones.  Plan:  1.  Endoscopic retrograde cholangiopancreatography with hopeful biliary sphincterotomy and bile duct stone(s) extraction(s). 2.  Risks (up to and including bleeding, infection, perforation, pancreatitis that can be complicated by infected necrosis and death), benefits (removal of stones, alleviating blockage, decreasing risk of cholangitis or choledocholithiasis-related pancreatitis), and alternatives (watchful waiting, percutaneous transhepatic cholangiography) of ERCP were explained to patient/family in detail and patient elects to proceed.

## 2015-09-03 ENCOUNTER — Other Ambulatory Visit (HOSPITAL_COMMUNITY): Payer: Self-pay | Admitting: Gastroenterology

## 2015-09-03 ENCOUNTER — Encounter (HOSPITAL_COMMUNITY)
Admission: RE | Admit: 2015-09-03 | Discharge: 2015-09-03 | Disposition: A | Discharge status: Home / Self Care | Payer: Medicare Other | Source: Ambulatory Visit | Attending: General Surgery | Admitting: General Surgery

## 2015-09-03 ENCOUNTER — Encounter (HOSPITAL_COMMUNITY): Payer: Self-pay | Admitting: Gastroenterology

## 2015-09-03 DIAGNOSIS — Z01818 Encounter for other preprocedural examination: Secondary | ICD-10-CM | POA: Insufficient documentation

## 2015-09-03 DIAGNOSIS — K8050 Calculus of bile duct without cholangitis or cholecystitis without obstruction: Secondary | ICD-10-CM

## 2015-09-03 DIAGNOSIS — K8080 Other cholelithiasis without obstruction: Secondary | ICD-10-CM | POA: Diagnosis not present

## 2015-09-03 LAB — CBC WITH DIFFERENTIAL/PLATELET
BASOS ABS: 0 10*3/uL (ref 0.0–0.1)
BASOS PCT: 0 %
EOS ABS: 0.2 10*3/uL (ref 0.0–0.7)
Eosinophils Relative: 5 %
HEMATOCRIT: 39.1 % (ref 39.0–52.0)
HEMOGLOBIN: 13.4 g/dL (ref 13.0–17.0)
Lymphocytes Relative: 22 %
Lymphs Abs: 1 10*3/uL (ref 0.7–4.0)
MCH: 31.8 pg (ref 26.0–34.0)
MCHC: 34.3 g/dL (ref 30.0–36.0)
MCV: 92.7 fL (ref 78.0–100.0)
Monocytes Absolute: 0.5 10*3/uL (ref 0.1–1.0)
Monocytes Relative: 12 %
NEUTROS ABS: 2.7 10*3/uL (ref 1.7–7.7)
NEUTROS PCT: 61 %
Platelets: 236 10*3/uL (ref 150–400)
RBC: 4.22 MIL/uL (ref 4.22–5.81)
RDW: 14.2 % (ref 11.5–15.5)
WBC: 4.3 10*3/uL (ref 4.0–10.5)

## 2015-09-03 LAB — COMPREHENSIVE METABOLIC PANEL
ALBUMIN: 3.6 g/dL (ref 3.5–5.0)
ALK PHOS: 93 U/L (ref 38–126)
ALT: 58 U/L (ref 17–63)
ANION GAP: 7 (ref 5–15)
AST: 55 U/L — AB (ref 15–41)
BILIRUBIN TOTAL: 0.7 mg/dL (ref 0.3–1.2)
BUN: 13 mg/dL (ref 6–20)
CO2: 27 mmol/L (ref 22–32)
Calcium: 9.1 mg/dL (ref 8.9–10.3)
Chloride: 100 mmol/L — ABNORMAL LOW (ref 101–111)
Creatinine, Ser: 0.83 mg/dL (ref 0.61–1.24)
GFR calc Af Amer: >60 mL/min (ref >60)
GFR calc non Af Amer: >60 mL/min (ref >60)
GLUCOSE: 93 mg/dL (ref 65–99)
POTASSIUM: 4.1 mmol/L (ref 3.5–5.1)
SODIUM: 134 mmol/L — AB (ref 135–145)
TOTAL PROTEIN: 6 g/dL — AB (ref 6.5–8.1)

## 2015-09-03 LAB — PROTIME-INR
INR: 1.06 (ref 0.00–1.49)
Prothrombin Time: 14 seconds (ref 11.6–15.2)

## 2015-09-03 LAB — APTT: APTT: 32 s (ref 24–37)

## 2015-09-03 NOTE — Pre-Procedure Instructions (Signed)
    Keefe Zawistowski  09/03/2015      [id number] [address] [place]: [phone]: [phone] [place] [address] [place]Phone: [phone]: [phone]   Your procedure is scheduled on 09-08-2015  Tuesday .  [facility] .  Call this number if you have problems the morning of surgery:  [phone]   Remember:  Do not eat food or drink liquids after midnight.   Take these medicines the morning of surgery with A SIP OF WATER alaprazolam(xanax) if needed,demeclocycline,diltiazem(cardizem),Pain medication if needed,Klor-con,Protonix,Tamsulosin(Flomax)   Do not wear jewelry     Do not wear lotions, powders, or perfumes.  You may not wear deodorant.  Do not shave 48 hours prior to surgery.  Men may shave face and neck .  Do not bring valuables to the hospital.  [facility] any belongings or [facility]ables.  Contacts, dentures or bridgework may not be worn into surgery.  Leave your suitcase in the car.  After surgery it may be brought to your room.  For patients admitted to the hospital, discharge time will be determined by your treatment team.  Patients discharged the day of surgery will not be allowed to drive home.    Special instructions:  See attached Sheet"Preparing for surgery" for instructions on CHG shower  Please read over the following fact sheets that you were given. Pain Booklet, Coughing and Deep Breathing and Surgical Site Infection Prevention

## 2015-09-03 NOTE — Progress Notes (Signed)
09-03-15  1600 Spoke with "Sharee Pimple" Endoscopy- pt on schedule for ERCP -repeat 09-07-15 -Dr. Watt Climes was done 09-02-15 Dr. Malena Catholic Endo- pt history done 08-28-15 By W. Hendrick,RN. Pt is being instructed by IVR "Tiffany" -see note in special needs.

## 2015-09-04 ENCOUNTER — Other Ambulatory Visit (HOSPITAL_COMMUNITY): Payer: Self-pay | Admitting: Gastroenterology

## 2015-09-04 ENCOUNTER — Other Ambulatory Visit: Payer: Self-pay | Admitting: Radiology

## 2015-09-04 DIAGNOSIS — K8020 Calculus of gallbladder without cholecystitis without obstruction: Secondary | ICD-10-CM

## 2015-09-04 DIAGNOSIS — K8050 Calculus of bile duct without cholangitis or cholecystitis without obstruction: Secondary | ICD-10-CM

## 2015-09-06 NOTE — H&P (Signed)
[place]  Location: [facility] Patient #: [id number] DOB: July 11, 1941 Married / Language: English / Race: White Male       History of Present Illness  The patient is a 74 year old male who presents with a complaint of acute cholecystitis with choledocholithiasis. Anthony Curry is here for a drain check. His cholecystostomy tube is clamped. He occasionally has some clear drainage around it but nothing else. His appetite is normal. He doesn't have any abdominal pain. He will be seeing Dr. [name] this afternoon for chemotherapy.  He is 74 years old. He has stage IV lung cancer but his performance status is good and his lung cancer is under control. 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 Hospitalized 2 months ago with acute cholecystitis and cholecystostomy tube was placed. He has atrial fibrillation on Eliquis. He has recovered and wants to have the tube removed and his gallbladder out. We have cardiac clearance from Dr. [name] who states we can stop his Eliquis 2 days prior to procedures. He has seen Dr. Paulita Fujita.  Dr. Paulita Fujita will arrange for admission to the hospital by trial hospitalist and will then perform ERCP on hospital day #2. I will coordinate and arrange for laparoscopic cholecystectomy with cholangiogram, possible open cholecystectomy on hospital day 3. I have asked for a physician assistant because this will likely be more difficult than usual. I will then transfer the patient to my care and discharge in the next day of everything goes well. He is ready to go ahead. He is here just to check the drain. He is frustrated that we have this scheduled his surgery. We will get this coordinated immediately   Allergies  Aspirin *ANALGESICS - NonNarcotic* No Known Drug Allergies08/30/2016 (Marked as Inactive)  Medication History  ALPRAZolam (0.'25MG'$  Tablet, Oral) Active. Declomycin ('150MG'$  Tablet, Oral) Active. Diltiazem HCl ('180MG'$   Capsule ER 24HR, Oral) Active. Furosemide ('40MG'$  Tablet, Oral) Active. [facility] Packet, Oral) Active. Mirtazapine ('15MG'$  Tablet, Oral) Active. Pantoprazole Sodium ('40MG'$  Tablet DR, Oral) Active. Tamsulosin HCl (0.'4MG'$  Capsule, Oral) Active. Nivolumab ('100MG'$ /10ML Solution, Intravenous) Active. Medications Reconciled  Vitals   Weight: 154 lb Height: 72in Body Surface Area: 1.88 m Body Mass Index: 20.89 kg/m Temp.: 97.46F(Temporal)  Pulse: 60 (Regular)  BP: 130/70 (Sitting, Left Arm, Standard)    Physical Exam Renelda Loma M. Dalbert Batman MD; 08/19/2015 2:43 PM) General Note: Pleasant man. Actually looks very good considering his age and comorbidities. Wife is with him  HEART:  RRR, no murmer, no ectopy  LUNGS: clear bilat.  Abdomen Note: Drain is in right upper quadrant and is capped off. The drain site is clean. Nontender. Abdomen is soft and nontender. No distention. No mass. No scars. No hernias.     Assessment & Plan  ACUTE CHOLECYSTITIS (K81.0)   You are being scheduled for surgery - Our schedulers will call you. Stop elquis 2 days prior to admission Dr. Paulita Fujita will arrange admission and ERCP the day after admission. If everything goes well, I will perform gallbladder surgery the next day, as we discussed We will try to get the scheduling done immediately.    You should hear from our office's scheduling department within 5 working days about the location, date, and time of surgery. We try to make accommodations for patient's preferences in scheduling surgery, but sometimes the OR schedule or the surgeon's schedule prevents [place] from making those accommodations.  If you have not heard from our office [phone]) in 5 working days, call the office and ask for  your surgeon's nurse.  If you have other questions about your diagnosis, plan, or surgery, call the office and ask for your surgeon's nurse.  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 Dalbert Batman, M.D., [facility] General and Minimally invasive Surgery Breast and Colorectal Surgery Office:   [phone] Pager:   [phone]

## 2015-09-07 ENCOUNTER — Ambulatory Visit (HOSPITAL_COMMUNITY): Payer: Medicare Other | Admitting: Anesthesiology

## 2015-09-07 ENCOUNTER — Encounter (HOSPITAL_COMMUNITY): Payer: Self-pay | Admitting: *Deleted

## 2015-09-07 ENCOUNTER — Encounter (HOSPITAL_COMMUNITY)
Admission: RE | Disposition: A | Discharge status: Home / Self Care | Payer: Self-pay | Source: Ambulatory Visit | Attending: Gastroenterology

## 2015-09-07 ENCOUNTER — Ambulatory Visit (HOSPITAL_COMMUNITY)
Admission: RE | Admit: 2015-09-07 | Discharge: 2015-09-07 | Disposition: A | Discharge status: Home / Self Care | Payer: Medicare Other | Source: Ambulatory Visit | Attending: Gastroenterology | Admitting: Gastroenterology

## 2015-09-07 ENCOUNTER — Ambulatory Visit (HOSPITAL_COMMUNITY): Payer: Medicare Other

## 2015-09-07 ENCOUNTER — Other Ambulatory Visit: Payer: Self-pay | Admitting: Gastroenterology

## 2015-09-07 DIAGNOSIS — K8050 Calculus of bile duct without cholangitis or cholecystitis without obstruction: Secondary | ICD-10-CM

## 2015-09-07 DIAGNOSIS — K219 Gastro-esophageal reflux disease without esophagitis: Secondary | ICD-10-CM | POA: Insufficient documentation

## 2015-09-07 DIAGNOSIS — I493 Ventricular premature depolarization: Secondary | ICD-10-CM | POA: Insufficient documentation

## 2015-09-07 DIAGNOSIS — M1990 Unspecified osteoarthritis, unspecified site: Secondary | ICD-10-CM | POA: Diagnosis not present

## 2015-09-07 DIAGNOSIS — Z79899 Other long term (current) drug therapy: Secondary | ICD-10-CM | POA: Insufficient documentation

## 2015-09-07 DIAGNOSIS — I119 Hypertensive heart disease without heart failure: Secondary | ICD-10-CM | POA: Diagnosis not present

## 2015-09-07 DIAGNOSIS — K8042 Calculus of bile duct with acute cholecystitis without obstruction: Secondary | ICD-10-CM | POA: Diagnosis not present

## 2015-09-07 DIAGNOSIS — Z85118 Personal history of other malignant neoplasm of bronchus and lung: Secondary | ICD-10-CM | POA: Insufficient documentation

## 2015-09-07 DIAGNOSIS — Z87891 Personal history of nicotine dependence: Secondary | ICD-10-CM | POA: Insufficient documentation

## 2015-09-07 DIAGNOSIS — K810 Acute cholecystitis: Secondary | ICD-10-CM | POA: Diagnosis not present

## 2015-09-07 DIAGNOSIS — Z96612 Presence of left artificial shoulder joint: Secondary | ICD-10-CM | POA: Insufficient documentation

## 2015-09-07 DIAGNOSIS — I10 Essential (primary) hypertension: Secondary | ICD-10-CM | POA: Diagnosis not present

## 2015-09-07 DIAGNOSIS — Z79891 Long term (current) use of opiate analgesic: Secondary | ICD-10-CM | POA: Diagnosis not present

## 2015-09-07 DIAGNOSIS — I4891 Unspecified atrial fibrillation: Secondary | ICD-10-CM | POA: Insufficient documentation

## 2015-09-07 DIAGNOSIS — Z7901 Long term (current) use of anticoagulants: Secondary | ICD-10-CM | POA: Diagnosis not present

## 2015-09-07 HISTORY — PX: ERCP: SHX5425

## 2015-09-07 LAB — COMPREHENSIVE METABOLIC PANEL
ALBUMIN: 3.9 g/dL (ref 3.5–5.0)
ALK PHOS: 92 U/L (ref 38–126)
ALT: 28 U/L (ref 17–63)
AST: 26 U/L (ref 15–41)
Anion gap: 4 — ABNORMAL LOW (ref 5–15)
BUN: 12 mg/dL (ref 6–20)
CALCIUM: 9 mg/dL (ref 8.9–10.3)
CO2: 27 mmol/L (ref 22–32)
CREATININE: 0.75 mg/dL (ref 0.61–1.24)
Chloride: 99 mmol/L — ABNORMAL LOW (ref 101–111)
GFR calc Af Amer: >60 mL/min (ref >60)
GFR calc non Af Amer: >60 mL/min (ref >60)
GLUCOSE: 99 mg/dL (ref 65–99)
Potassium: 3.8 mmol/L (ref 3.5–5.1)
SODIUM: 130 mmol/L — AB (ref 135–145)
Total Bilirubin: 0.8 mg/dL (ref 0.3–1.2)
Total Protein: 6.3 g/dL — ABNORMAL LOW (ref 6.5–8.1)

## 2015-09-07 SURGERY — ERCP, WITH INTERVENTION IF INDICATED
Anesthesia: General

## 2015-09-07 MED ORDER — PROPOFOL 10 MG/ML IV BOLUS
INTRAVENOUS | Status: AC
  Filled 2015-09-07: qty 20

## 2015-09-07 MED ORDER — ONDANSETRON HCL 4 MG/2ML IJ SOLN
INTRAMUSCULAR | Status: DC | PRN
  Administered 2015-09-07: 4 mg via INTRAVENOUS

## 2015-09-07 MED ORDER — SODIUM CHLORIDE 0.9 % IV SOLN
INTRAVENOUS | Status: DC | PRN
  Administered 2015-09-07: 40 mL

## 2015-09-07 MED ORDER — LACTATED RINGERS IV SOLN
INTRAVENOUS | Status: DC
  Administered 2015-09-07: 13:00:00 via INTRAVENOUS
  Administered 2015-09-07: 1000 mL via INTRAVENOUS

## 2015-09-07 MED ORDER — IOHEXOL 300 MG/ML  SOLN
10.0000 mL | Freq: Once | INTRAMUSCULAR | Status: DC | PRN
  Administered 2015-09-07: 10 mL
  Filled 2015-09-07: qty 10

## 2015-09-07 MED ORDER — CIPROFLOXACIN IN D5W 400 MG/200ML IV SOLN
INTRAVENOUS | Status: AC
  Filled 2015-09-07: qty 200

## 2015-09-07 MED ORDER — FENTANYL CITRATE (PF) 100 MCG/2ML IJ SOLN
INTRAMUSCULAR | Status: AC | PRN
  Administered 2015-09-07: 50 ug via INTRAVENOUS

## 2015-09-07 MED ORDER — LIDOCAINE HCL (CARDIAC) 20 MG/ML IV SOLN
INTRAVENOUS | Status: DC | PRN
  Administered 2015-09-07: 50 mg via INTRAVENOUS

## 2015-09-07 MED ORDER — LIDOCAINE-EPINEPHRINE 2 %-1:100000 IJ SOLN
INTRAMUSCULAR | Status: AC
  Filled 2015-09-07: qty 1

## 2015-09-07 MED ORDER — SUCCINYLCHOLINE CHLORIDE 20 MG/ML IJ SOLN
INTRAMUSCULAR | Status: DC | PRN
  Administered 2015-09-07: 100 mg via INTRAVENOUS

## 2015-09-07 MED ORDER — FENTANYL CITRATE (PF) 100 MCG/2ML IJ SOLN
INTRAMUSCULAR | Status: DC | PRN
  Administered 2015-09-07: 50 ug via INTRAVENOUS
  Administered 2015-09-07 (×2): 25 ug via INTRAVENOUS

## 2015-09-07 MED ORDER — FENTANYL CITRATE (PF) 100 MCG/2ML IJ SOLN
INTRAMUSCULAR | Status: AC
  Filled 2015-09-07: qty 4

## 2015-09-07 MED ORDER — MIDAZOLAM HCL 2 MG/2ML IJ SOLN
INTRAMUSCULAR | Status: AC
  Filled 2015-09-07: qty 6

## 2015-09-07 MED ORDER — SODIUM CHLORIDE 0.9 % IV SOLN: INTRAVENOUS | Status: DC

## 2015-09-07 MED ORDER — MIDAZOLAM HCL 2 MG/2ML IJ SOLN
INTRAMUSCULAR | Status: AC | PRN
  Administered 2015-09-07 (×3): 1 mg via INTRAVENOUS

## 2015-09-07 MED ORDER — PROPOFOL 10 MG/ML IV BOLUS
INTRAVENOUS | Status: DC | PRN
  Administered 2015-09-07: 150 mg via INTRAVENOUS

## 2015-09-07 MED ORDER — IOHEXOL 300 MG/ML  SOLN
20.0000 mL | Freq: Once | INTRAMUSCULAR | Status: DC | PRN
  Administered 2015-09-07: 20 mL
  Filled 2015-09-07: qty 20

## 2015-09-07 MED ORDER — CIPROFLOXACIN IN D5W 400 MG/200ML IV SOLN
400.0000 mg | Freq: Once | INTRAVENOUS | Status: AC
  Administered 2015-09-07: 400 mg via INTRAVENOUS

## 2015-09-07 MED ORDER — EPHEDRINE SULFATE 50 MG/ML IJ SOLN
INTRAMUSCULAR | Status: DC | PRN
  Administered 2015-09-07: 5 mg via INTRAVENOUS

## 2015-09-07 NOTE — Anesthesia Procedure Notes (Signed)
Procedure Name: Intubation Date/Time: 09/07/2015 1:56 PM Performed by: Anne Fu Pre-anesthesia Checklist: Patient identified, Emergency Drugs available, Suction available, Patient being monitored and Timeout performed Patient Re-evaluated:Patient Re-evaluated prior to inductionOxygen Delivery Method: Circle system utilized Preoxygenation: Pre-oxygenation with 100% oxygen Intubation Type: IV induction Ventilation: Mask ventilation without difficulty Laryngoscope Size: Mac and 4 Grade View: Grade I Tube type: Oral Tube size: 7.5 mm Number of attempts: 1 Airway Equipment and Method: Stylet Placement Confirmation: ETT inserted through vocal cords under direct vision,  positive ETCO2,  CO2 detector and breath sounds checked- equal and bilateral Secured at: 21 cm Tube secured with: Tape Dental Injury: Teeth and Oropharynx as per pre-operative assessment

## 2015-09-07 NOTE — Procedures (Signed)
Successful placement of wire through the GB, cystic and CBD and coiled within the duodenum to be used during impending ERCP for CBD cannulation. Successful fluoroscopic guided exchange of 10 Fr chole tube. No immediate post procedural complications.   Ronny Bacon, MD Pager #: [phone]

## 2015-09-07 NOTE — Discharge Instructions (Signed)
Call if question or problem otherwise liquids only tonight and then nothing by mouth after midnight as per surgical teamEndoscopic Retrograde Cholangiopancreatography (ERCP), Care After Refer to this sheet in the next few weeks. These instructions provide you with information on caring for yourself after your procedure. Your health care provider may also give you more specific instructions. Your treatment has been planned according to current medical practices, but problems sometimes occur. Call your health care provider if you have any problems or questions after your procedure.  WHAT TO EXPECT AFTER THE PROCEDURE  After your procedure, it is typical to feel:   Soreness in your throat.   Sick to your stomach (nauseous).   Bloated.  Dizzy.   Fatigued. HOME CARE INSTRUCTIONS  Have a friend or family member stay with you for the first 24 hours after your procedure.  Start taking your usual medicines and eating normally as soon as you feel well enough to do so or as directed by your health care provider. SEEK MEDICAL CARE IF:  You have abdominal pain.   You develop signs of infection, such as:   Chills.   Feeling unwell.  SEEK IMMEDIATE MEDICAL CARE IF:  You have difficulty swallowing.  You have worsening throat, chest, or abdominal pain.  You vomit.  You have bloody or very black stools.  You have a fever. Document Released: 09/11/2013 Document Reviewed: 09/11/2013 [facility] Patient Information 2015 [place], [place]. This information is not intended to replace advice given to you by your health care provider. Make sure you discuss any questions you have with your health care provider.

## 2015-09-07 NOTE — Progress Notes (Signed)
Anthony Curry 1:14 PM  Subjective: Patient seen and examined and answered all of his questions and discussed our procedure today as well as his clinical course since he had acute cholecystitis as well as the unsuccessful ERCP last week and he has no new complaints except for some pain from the manipulation from earlier today  Objective: Vital signs stable afebrile no acute distress exam please see preassessment evaluation labs okay IR pictures reviewed  Assessment: CBD stones  Plan: Okay to proceed with ERCP over wire placed by interventional radiology and then barring complication which we discussed surgery tomorrow  [facility]  Pager [phone] After 5PM or if no answer call [phone]

## 2015-09-07 NOTE — Progress Notes (Signed)
Patient ID: Anthony Curry, male   DOB: Mar 19, 1941, 74 y.o.   MRN: [id number]    Referring Physician(s): Magod,Marc  Chief Complaint:  Cholecystitis with choledocholithiasis  Subjective: Patient familiar to IR service from prior percutaneous cholecystostomy tube placement on 06/13/15 secondary to acute cholecystitis. 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 Patient was seen in follow-up at IR clinic recently by Dr. Laurence Ferrari and cholangiogram demonstrated cholelithiasis and choledocholithiasis with nonobstructing stones in the cystic duct and distal common bile duct. The tube was capped at that visit. The patient has since followed up with Dr. Dalbert Batman and is scheduled for cholecystectomy this week. Patient also underwent ERCP on 09/02/15 but was unable to cannulate the common bile duct. A pancreatic duct stent was placed. The patient presents today for follow-up cholangiogram with possible biliary duct cannulation via the cholecystostomy tube followed by repeat ERCP and subsequent cholecystectomy. He currently denies fevers, chills, headaches, chest pain, dyspnea, cough, abdominal or back pain, nausea, vomiting or abnormal bleeding. He has been periodically attaching gallbladder drain to gravity bag as needed.   Allergies: Review of patient's allergies indicates no active allergies.  Medications: Prior to Admission medications   Medication Sig Start Date End Date Taking? Authorizing Provider  ALPRAZolam (XANAX) 0.25 MG tablet Take 0.25 mg by mouth 3 (three) times daily as needed for anxiety or sleep. And also at night for sleep.    Historical Provider, MD  Ascorbic Acid (VITAMIN C PO) Take 1,000 mg by mouth daily.     Historical Provider, MD  B Complex-C (SUPER B COMPLEX PO) Take 1 each by mouth daily.     Historical Provider, MD  calcium carbonate (OS-CAL) 600 MG TABS  Take 600 mg by mouth daily with breakfast.    Historical Provider, MD  demeclocycline (DECLOMYCIN) 150 MG tablet Take 150 mg by mouth 2 (two) times daily.    Historical Provider, MD  diltiazem (CARDIZEM CD) 180 MG 24 hr capsule Take 1 capsule (180 mg total) by mouth daily at 12 noon. 01/09/15   Sherran Needs, NP  furosemide (LASIX) 40 MG tablet Take 0.5 tablets (20 mg total) by mouth daily. Patient taking differently: Take 20 mg by mouth every other day.  07/20/15   Sherran Needs, NP  HYDROcodone-acetaminophen (NORCO/VICODIN) 5-325 MG per tablet Take 1-2 tablets by mouth every 4 (four) hours as needed for moderate pain. 06/15/15   Emina Riebock, NP  KLOR-CON M20 20 MEQ tablet Take 20 mEq by mouth every morning.  10/18/14   Historical Provider, MD  Lactobacillus (ACIDOPHILUS) CAPS capsule Take 1 capsule by mouth daily.    Historical Provider, MD  MAGNESIUM PO Take 400 mg by mouth daily.     Historical Provider, MD  mirtazapine (REMERON) 15 MG tablet Take 1 tablet (15 mg total) by mouth at bedtime. Patient taking differently: Take 15 mg by mouth at bedtime as needed (sleep).  01/19/15   [name], MD  Multiple Vitamin (MULTIVITAMIN WITH MINERALS) TABS Take 1 tablet by mouth daily.    Historical Provider, MD  Nivolumab (OPDIVO IV) Inject as directed every 2 weeks    Historical Provider, MD  Nutritional Supplements (ENSURE COMPLETE PO) Take 1 Can by mouth 2 (two) times daily.    Historical Provider, MD  Omega-3 Fatty Acids (FISH OIL PO) Take 1 tablet by mouth daily.    Historical Provider, MD  pantoprazole (PROTONIX) 40 MG  tablet Take 40 mg by mouth every morning. 06/02/15   Historical Provider, MD  tamsulosin (FLOMAX) 0.4 MG CAPS capsule Take 0.4 mg by mouth daily at 12 noon.     Historical Provider, MD  thiamine (VITAMIN B-1) 100 MG tablet Take 1 tablet (100 mg total) by mouth daily. 09/11/13   Erick Colace, NP  vitamin E 400 UNIT capsule Take 400 Units by mouth daily.    Historical Provider,  MD     Vital Signs: BP 166/68, heart rate 65, temperature 98, respirations 15, oxygen saturation 98% on room air  Physical Exam patient awake, alert. Chest clear to auscultation bilaterally. Heart with regular rate and rhythm. Abdomen soft, positive bowel sounds, intact capped gallbladder drain, insertion site okay, nontender; extremities with full range of motion and no significant edema  Imaging: No results found.  Labs:  CBC:  Recent Labs  07/22/15 1055 08/05/15 0951 08/19/15 1021 09/03/15 1452  WBC 4.2 4.4 3.9* 4.3  HGB 14.0 13.8 14.1 13.4  HCT 39.9 40.2 40.7 39.1  PLT 212 265 239 236    COAGS:  Recent Labs  06/12/15 0519 06/12/15 0545 06/12/15 1246 06/12/15 2300 09/03/15 1452  INR 1.26  --   --   --  1.06  APTT  --  70* 104* 66* 32    BMP:  Recent Labs  06/12/15 0519 06/13/15 0530 06/15/15 0519  07/22/15 1055 08/05/15 0951 08/19/15 1021 09/03/15 1452  NA 129* 128* 129*  < > 133* 132* 133* 134*  K 3.4* 3.9 4.1  < > 3.9 4.0 3.9 4.1  CL 94* 95* 92*  --   --   --   --  100*  CO2 '24 25 29  '$ < > '24 25 24 27  '$ GLUCOSE 77 86 101*  < > 93 95 100 93  BUN '12 8 10  '$ < > 12.6 15.8 12.2 13  CALCIUM 8.6* 8.4* 8.9  < > 9.5 9.4 9.6 9.1  CREATININE 0.78 0.80 0.95  < > 0.8 0.7 0.7 0.83  GFRNONAA >60 >60 >60  --   --   --   --  >60  GFRAA >60 >60 >60  --   --   --   --  >60  < > = values in this interval not displayed.  LIVER FUNCTION TESTS:  Recent Labs  07/22/15 1055 08/05/15 0951 08/19/15 1021 09/03/15 1452  BILITOT 0.72 0.82 0.79 0.7  AST '23 24 27 '$ 55*  ALT '17 16 25 '$ 58  ALKPHOS 93 95 93 93  PROT 6.3* 6.3* 6.5 6.0*  ALBUMIN 3.9 3.9 4.0 3.6    Assessment and Plan: Patient with history of atrial fibrillation, stage IV lung cancer, recent episode of acute cholecystitis with subsequent cholecystostomy tube placement and now with evidence of choledocholithiasis. Recent ERCP performed on 09/02/15 but unable to cannulate common bile duct. Patient now presents  for follow-up cholangiogram with possible cannulation of biliary duct via existing cholecystostomy tube followed by repeat ERCP. Cholecystectomy planned this admission. Details/risks of the procedure, including but not limited to, internal bleeding, infection, inability to cannulate biliary duct via above method, discussed with patient and wife their understanding and consent.   Signed: D. Rowe [name] 09/07/2015, 10:09 AM   I spent a total of 15 minutes at the the patient's bedside AND on the patient's hospital floor or unit, greater than 50% of which was counseling/coordinating care for cholangiogram with possible cannulation of biliary duct

## 2015-09-07 NOTE — Op Note (Signed)
[facility] [place], [address]   ERCP PROCEDURE REPORT  PATIENT: Anthony Curry, Anthony Curry  MR# :[id number] BIRTHDATE: November 05, 1941  GENDER: male ENDOSCOPIST: Clarene Essex, MD REFERRED BY: Fanny Skates, M.D. PROCEDURE DATE:  09/07/2015 PROCEDURE:   ERCP with sphincterotomy/papillotomy and ERCP with removal of calculus/calculi ASA CLASS:   3 INDICATIONS:  CBD stoneMEDICATIONS:     general anesthesia TOPICAL ANESTHETIC:   none  DESCRIPTION OF PROCEDURE:   After the risks benefits and alternatives of the procedure were thoroughly explained, informed consent was obtained.  The ercp pentax [id number]  endoscope was introduced through the mouth and advanced to the second portion of the duodenum . a normal-appearing ampulla and the JAG Jagwire placed by radiology was found curled in the duodenum and it was slowly withdrawn until we were able to snare it and the snare and the wire was withdrawn through the scope and the triple-lumen sphincterotome was then advanced over the wire and dye was injected and deep selective cannulation was obtained and there was no pancreatic duct injection or wire advancement throughout the procedure then we proceeded with a moderate size sphincterotomy in the customary fashion until we had adequate biliary drainage and could advance the fully bowed sphincterotome easily in and out of the duct and then we exchanged the sphincterotome for the adjustable balloon catheter and a large stone was removed and another balloon pull-through was negative and then we withdrew the balloon catheter and left the wire that was placed by radiology and cannulated the CBD with the sphincterotome loaded with a different wire and deep selective cannulation was obtained on the first attempt and the wire was advanced into the intrahepatics and there seemed to be due to upper CBD stones and the sphincterotome was exchanged for the balloon catheter and 2 more stones  were withdrawn on balloon pull-through and then we proceeded with an occlusion cholangiogram of the CBD which was negative and there was adequate biliary drainage and the balloon was pulled through the ampulla without any further debris or stones and first the CBD wire was removed and then we removed the wire placed by radiology through the cystic duct and the procedure was terminated and the patient tolerated the procedure well Estimated blood loss is zero unless otherwise noted in this procedure report.       COMPLICATIONS:  none  ENDOSCOPIC IMPRESSION: 1. normal ampulla 2. no pancreatic duct injection or wire advancement throughout the procedure 3. sphincterotomy and CBD stones  and possibly cystic duct stones removed as above 4. negative occlusion cholangiogram and adequate biliary drainage at the end of the procedure RECOMMENDATIONS: observe for delayed complications if none cholecystectomy tomorrow with drain removal hopefully and Intra-Op cholangiogram to confirm no more residual stones and as an aside the pancreatic duct stent placed last week was not seen on fluoroscopy  or endoscopically     _______________________________ eSigned:  Clarene Essex, MD 09/07/2015 3:06 PM   UP:[id number] Dalbert Batman, MD

## 2015-09-07 NOTE — Transfer of Care (Signed)
Immediate Anesthesia Transfer of Care Note  Patient: Anthony Curry  Procedure(s) Performed: Procedure(s): ENDOSCOPIC RETROGRADE CHOLANGIOPANCREATOGRAPHY (ERCP) (N/A)  Patient Location: PACU  Anesthesia Type:General  Level of Consciousness:  sedated, patient cooperative and responds to stimulation  Airway & Oxygen Therapy:Patient Spontanous Breathing and Patient connected to face mask oxgen  Post-op Assessment:  Report given to PACU RN and Post -op Vital signs reviewed and stable  Post vital signs:  Reviewed and stable  Last Vitals:  Filed Vitals:   09/07/15 1010  BP: 166/68  Pulse: 65  Temp: 36.7 C  Resp: 15    Complications: No apparent anesthesia complications

## 2015-09-07 NOTE — Anesthesia Preprocedure Evaluation (Signed)
Anesthesia Evaluation  Patient identified by MRN, date of birth, ID band Patient awake    Reviewed: Allergy & Precautions, NPO status , Patient's Chart, lab work & pertinent test results  Airway Mallampati: II  TM Distance: >3 FB Neck ROM: Full    Dental no notable dental hx.    Pulmonary pneumonia, resolved, former smoker,  CXR reviewed: Emphysematous changes.   Pulmonary exam normal breath sounds clear to auscultation       Cardiovascular hypertension, Pt. on medications Normal cardiovascular exam+ dysrhythmias Atrial Fibrillation  Rhythm:Regular Rate:Normal  ECHO: EF 55-60%  ECG: SR with PVCs   Neuro/Psych PSYCHIATRIC DISORDERS negative neurological ROS     GI/Hepatic GERD  Medicated,(+)     substance abuse  alcohol use, H/O alcohol abuse.   Endo/Other  negative endocrine ROS  Renal/GU negative Renal ROS  negative genitourinary   Musculoskeletal  (+) Arthritis ,   Abdominal   Peds negative pediatric ROS (+)  Hematology  (+) anemia ,   Anesthesia Other Findings   Reproductive/Obstetrics negative OB ROS                             Anesthesia Physical  Anesthesia Plan  ASA: III  Anesthesia Plan: General   Post-op Pain Management:    Induction: Intravenous  Airway Management Planned: Oral ETT  Additional Equipment:   Intra-op Plan:   Post-operative Plan: Extubation in OR  Informed Consent: I have reviewed the patients History and Physical, chart, labs and discussed the procedure including the risks, benefits and alternatives for the proposed anesthesia with the patient or authorized representative who has indicated his/her understanding and acceptance.   Dental advisory given  Plan Discussed with: CRNA  Anesthesia Plan Comments:         Anesthesia Quick Evaluation

## 2015-09-07 NOTE — Progress Notes (Signed)
Pt returned from IR at 1206; report gotten from [place]. Pt arrived to Endo alert and oriented, all vial signs stable and pt is currently on room air. Scheduled for ERCP with Dr Watt Climes at 1230. Brt, rn

## 2015-09-07 NOTE — Addendum Note (Signed)
Addended byClarene Essex on: 09/07/2015 08:59 AM   Modules accepted: Orders

## 2015-09-08 ENCOUNTER — Ambulatory Visit (HOSPITAL_COMMUNITY)
Admission: RE | Admit: 2015-09-08 | Discharge: 2015-09-10 | Disposition: A | Discharge status: Home / Self Care | Payer: Medicare Other | Source: Ambulatory Visit | Attending: General Surgery | Admitting: General Surgery

## 2015-09-08 ENCOUNTER — Encounter (HOSPITAL_COMMUNITY)
Admission: RE | Disposition: A | Discharge status: Home / Self Care | Payer: Self-pay | Source: Ambulatory Visit | Attending: General Surgery

## 2015-09-08 ENCOUNTER — Ambulatory Visit (HOSPITAL_COMMUNITY): Payer: Medicare Other | Admitting: Anesthesiology

## 2015-09-08 ENCOUNTER — Encounter (HOSPITAL_COMMUNITY): Payer: Self-pay | Admitting: Gastroenterology

## 2015-09-08 DIAGNOSIS — R338 Other retention of urine: Secondary | ICD-10-CM | POA: Diagnosis not present

## 2015-09-08 DIAGNOSIS — K8020 Calculus of gallbladder without cholecystitis without obstruction: Secondary | ICD-10-CM | POA: Diagnosis not present

## 2015-09-08 DIAGNOSIS — E871 Hypo-osmolality and hyponatremia: Secondary | ICD-10-CM | POA: Diagnosis not present

## 2015-09-08 DIAGNOSIS — Z7901 Long term (current) use of anticoagulants: Secondary | ICD-10-CM | POA: Insufficient documentation

## 2015-09-08 DIAGNOSIS — R339 Retention of urine, unspecified: Secondary | ICD-10-CM | POA: Diagnosis not present

## 2015-09-08 DIAGNOSIS — I10 Essential (primary) hypertension: Secondary | ICD-10-CM | POA: Insufficient documentation

## 2015-09-08 DIAGNOSIS — I4891 Unspecified atrial fibrillation: Secondary | ICD-10-CM | POA: Diagnosis not present

## 2015-09-08 DIAGNOSIS — K8066 Calculus of gallbladder and bile duct with acute and chronic cholecystitis without obstruction: Secondary | ICD-10-CM | POA: Diagnosis not present

## 2015-09-08 DIAGNOSIS — K8012 Calculus of gallbladder with acute and chronic cholecystitis without obstruction: Secondary | ICD-10-CM | POA: Diagnosis not present

## 2015-09-08 DIAGNOSIS — K219 Gastro-esophageal reflux disease without esophagitis: Secondary | ICD-10-CM | POA: Diagnosis not present

## 2015-09-08 DIAGNOSIS — T8859XA Other complications of anesthesia, initial encounter: Secondary | ICD-10-CM

## 2015-09-08 DIAGNOSIS — C3491 Malignant neoplasm of unspecified part of right bronchus or lung: Secondary | ICD-10-CM | POA: Diagnosis not present

## 2015-09-08 DIAGNOSIS — K8040 Calculus of bile duct with cholecystitis, unspecified, without obstruction: Secondary | ICD-10-CM | POA: Diagnosis present

## 2015-09-08 DIAGNOSIS — N400 Enlarged prostate without lower urinary tract symptoms: Secondary | ICD-10-CM | POA: Diagnosis not present

## 2015-09-08 DIAGNOSIS — R509 Fever, unspecified: Secondary | ICD-10-CM

## 2015-09-08 HISTORY — DX: Adverse effect of unspecified anesthetic, initial encounter: T41.45XA

## 2015-09-08 HISTORY — DX: Other complications of anesthesia, initial encounter: T88.59XA

## 2015-09-08 HISTORY — PX: CHOLECYSTECTOMY: SHX55

## 2015-09-08 HISTORY — DX: Anxiety disorder, unspecified: F41.9

## 2015-09-08 LAB — CREATININE, SERUM
Creatinine, Ser: 0.88 mg/dL (ref 0.61–1.24)
GFR calc Af Amer: >60 mL/min
GFR calc non Af Amer: >60 mL/min

## 2015-09-08 LAB — CBC
HCT: 42.8 % (ref 39.0–52.0)
Hemoglobin: 14.8 g/dL (ref 13.0–17.0)
MCH: 31.8 pg (ref 26.0–34.0)
MCHC: 34.6 g/dL (ref 30.0–36.0)
MCV: 92 fL (ref 78.0–100.0)
Platelets: 254 10*3/uL (ref 150–400)
RBC: 4.65 MIL/uL (ref 4.22–5.81)
RDW: 14 % (ref 11.5–15.5)
WBC: 10.2 10*3/uL (ref 4.0–10.5)

## 2015-09-08 SURGERY — LAPAROSCOPIC CHOLECYSTECTOMY WITH INTRAOPERATIVE CHOLANGIOGRAM
Anesthesia: General | Site: Abdomen

## 2015-09-08 MED ORDER — ENSURE COMPLETE PO LIQD: 237.0000 mL | Freq: Two times a day (BID) | ORAL | Status: DC

## 2015-09-08 MED ORDER — HEPARIN SODIUM (PORCINE) 5000 UNIT/ML IJ SOLN
5000.0000 [IU] | Freq: Three times a day (TID) | INTRAMUSCULAR | Status: DC
  Administered 2015-09-09 – 2015-09-10 (×3): 5000 [IU] via SUBCUTANEOUS
  Filled 2015-09-08 (×4): qty 1

## 2015-09-08 MED ORDER — PROMETHAZINE HCL 25 MG/ML IJ SOLN: 6.2500 mg | INTRAMUSCULAR | Status: DC | PRN

## 2015-09-08 MED ORDER — MAGNESIUM OXIDE 400 (241.3 MG) MG PO TABS
400.0000 mg | ORAL_TABLET | Freq: Every day | ORAL | Status: DC
  Administered 2015-09-08 – 2015-09-10 (×3): 400 mg via ORAL
  Filled 2015-09-08 (×3): qty 1

## 2015-09-08 MED ORDER — VITAMIN C 500 MG PO TABS
1000.0000 mg | ORAL_TABLET | Freq: Every day | ORAL | Status: DC
  Administered 2015-09-08 – 2015-09-10 (×3): 1000 mg via ORAL
  Filled 2015-09-08 (×3): qty 2

## 2015-09-08 MED ORDER — MAGNESIUM 200 MG PO TABS: 400.0000 mg | ORAL_TABLET | Freq: Every day | ORAL | Status: DC

## 2015-09-08 MED ORDER — PANTOPRAZOLE SODIUM 40 MG PO TBEC
40.0000 mg | DELAYED_RELEASE_TABLET | Freq: Every morning | ORAL | Status: DC
  Administered 2015-09-08 – 2015-09-10 (×3): 40 mg via ORAL
  Filled 2015-09-08 (×3): qty 1

## 2015-09-08 MED ORDER — VITAMIN B-1 100 MG PO TABS
100.0000 mg | ORAL_TABLET | Freq: Every day | ORAL | Status: DC
  Administered 2015-09-08 – 2015-09-10 (×3): 100 mg via ORAL
  Filled 2015-09-08 (×3): qty 1

## 2015-09-08 MED ORDER — ONDANSETRON HCL 4 MG/2ML IJ SOLN: 4.0000 mg | Freq: Four times a day (QID) | INTRAMUSCULAR | Status: DC | PRN

## 2015-09-08 MED ORDER — FENTANYL CITRATE (PF) 250 MCG/5ML IJ SOLN
INTRAMUSCULAR | Status: DC | PRN
  Administered 2015-09-08 (×5): 50 ug via INTRAVENOUS

## 2015-09-08 MED ORDER — ENSURE ENLIVE PO LIQD
237.0000 mL | Freq: Two times a day (BID) | ORAL | Status: DC
  Administered 2015-09-08 – 2015-09-09 (×2): 237 mL via ORAL

## 2015-09-08 MED ORDER — HEMOSTATIC AGENTS (NO CHARGE) OPTIME
TOPICAL | Status: DC | PRN
  Administered 2015-09-08: 1 via TOPICAL

## 2015-09-08 MED ORDER — 0.9 % SODIUM CHLORIDE (POUR BTL) OPTIME
TOPICAL | Status: DC | PRN
  Administered 2015-09-08: 1000 mL

## 2015-09-08 MED ORDER — LIDOCAINE HCL (CARDIAC) 20 MG/ML IV SOLN
INTRAVENOUS | Status: DC | PRN
  Administered 2015-09-08: 100 mg via INTRAVENOUS

## 2015-09-08 MED ORDER — DILTIAZEM HCL ER COATED BEADS 180 MG PO CP24
180.0000 mg | ORAL_CAPSULE | Freq: Every day | ORAL | Status: DC
  Administered 2015-09-09: 180 mg via ORAL
  Filled 2015-09-08 (×2): qty 1

## 2015-09-08 MED ORDER — NEOSTIGMINE METHYLSULFATE 10 MG/10ML IV SOLN
INTRAVENOUS | Status: AC
  Filled 2015-09-08: qty 1

## 2015-09-08 MED ORDER — POTASSIUM CHLORIDE CRYS ER 20 MEQ PO TBCR
20.0000 meq | EXTENDED_RELEASE_TABLET | Freq: Every morning | ORAL | Status: DC
  Administered 2015-09-09 – 2015-09-10 (×2): 20 meq via ORAL
  Filled 2015-09-08 (×2): qty 1

## 2015-09-08 MED ORDER — LACTATED RINGERS IV SOLN
INTRAVENOUS | Status: DC
  Administered 2015-09-08: 13:00:00 via INTRAVENOUS

## 2015-09-08 MED ORDER — CALCIUM CARBONATE 1250 (500 CA) MG PO TABS
1.0000 | ORAL_TABLET | Freq: Every day | ORAL | Status: DC
  Administered 2015-09-09 – 2015-09-10 (×2): 500 mg via ORAL
  Filled 2015-09-08 (×2): qty 1

## 2015-09-08 MED ORDER — ACIDOPHILUS PO CAPS: 1.0000 | ORAL_CAPSULE | Freq: Every day | ORAL | Status: DC

## 2015-09-08 MED ORDER — DEMECLOCYCLINE HCL 150 MG PO TABS
150.0000 mg | ORAL_TABLET | Freq: Two times a day (BID) | ORAL | Status: DC
  Administered 2015-09-08 – 2015-09-09 (×3): 150 mg via ORAL
  Filled 2015-09-08 (×5): qty 1

## 2015-09-08 MED ORDER — LIDOCAINE HCL (CARDIAC) 20 MG/ML IV SOLN
INTRAVENOUS | Status: AC
  Filled 2015-09-08: qty 5

## 2015-09-08 MED ORDER — ONDANSETRON HCL 4 MG/2ML IJ SOLN
INTRAMUSCULAR | Status: AC
  Filled 2015-09-08: qty 2

## 2015-09-08 MED ORDER — HYDROCODONE-ACETAMINOPHEN 5-325 MG PO TABS
ORAL_TABLET | ORAL | Status: AC
  Filled 2015-09-08: qty 2

## 2015-09-08 MED ORDER — HYDROMORPHONE HCL 1 MG/ML IJ SOLN
INTRAMUSCULAR | Status: AC
  Filled 2015-09-08: qty 1

## 2015-09-08 MED ORDER — BUPIVACAINE-EPINEPHRINE (PF) 0.5% -1:200000 IJ SOLN
INTRAMUSCULAR | Status: AC
  Filled 2015-09-08: qty 30

## 2015-09-08 MED ORDER — MIRTAZAPINE 15 MG PO TABS
15.0000 mg | ORAL_TABLET | Freq: Every evening | ORAL | Status: DC | PRN
  Filled 2015-09-08: qty 1

## 2015-09-08 MED ORDER — ROCURONIUM BROMIDE 50 MG/5ML IV SOLN
INTRAVENOUS | Status: AC
  Filled 2015-09-08: qty 1

## 2015-09-08 MED ORDER — VITAMIN C 500 MG/5ML PO SYRP
1000.0000 mg | ORAL_SOLUTION | Freq: Every day | ORAL | Status: DC
  Filled 2015-09-08: qty 10

## 2015-09-08 MED ORDER — RISAQUAD PO CAPS
1.0000 | ORAL_CAPSULE | Freq: Every day | ORAL | Status: DC
  Administered 2015-09-08 – 2015-09-10 (×3): 1 via ORAL
  Filled 2015-09-08 (×3): qty 1

## 2015-09-08 MED ORDER — CALCIUM CARBONATE 600 MG PO TABS: 600.0000 mg | ORAL_TABLET | Freq: Every day | ORAL | Status: DC

## 2015-09-08 MED ORDER — PROPOFOL 10 MG/ML IV BOLUS
INTRAVENOUS | Status: DC | PRN
  Administered 2015-09-08: 150 mg via INTRAVENOUS

## 2015-09-08 MED ORDER — CHLORHEXIDINE GLUCONATE 4 % EX LIQD: 1.0000 "application " | Freq: Once | CUTANEOUS | Status: DC

## 2015-09-08 MED ORDER — MIDAZOLAM HCL 2 MG/2ML IJ SOLN
INTRAMUSCULAR | Status: AC
  Filled 2015-09-08: qty 2

## 2015-09-08 MED ORDER — NEOSTIGMINE METHYLSULFATE 10 MG/10ML IV SOLN
INTRAVENOUS | Status: DC | PRN
  Administered 2015-09-08: 4 mg via INTRAVENOUS

## 2015-09-08 MED ORDER — MEPERIDINE HCL 25 MG/ML IJ SOLN: 6.2500 mg | INTRAMUSCULAR | Status: DC | PRN

## 2015-09-08 MED ORDER — ADULT MULTIVITAMIN W/MINERALS CH
1.0000 | ORAL_TABLET | Freq: Every day | ORAL | Status: DC
  Administered 2015-09-08 – 2015-09-10 (×3): 1 via ORAL
  Filled 2015-09-08 (×3): qty 1

## 2015-09-08 MED ORDER — PROPOFOL 10 MG/ML IV BOLUS
INTRAVENOUS | Status: AC
  Filled 2015-09-08: qty 20

## 2015-09-08 MED ORDER — POTASSIUM CHLORIDE IN NACL 20-0.9 MEQ/L-% IV SOLN
INTRAVENOUS | Status: DC
  Administered 2015-09-08 – 2015-09-09 (×2): via INTRAVENOUS
  Filled 2015-09-08 (×2): qty 1000

## 2015-09-08 MED ORDER — POLYETHYLENE GLYCOL 3350 17 G PO PACK: 17.0000 g | PACK | Freq: Every day | ORAL | Status: DC | PRN

## 2015-09-08 MED ORDER — ONDANSETRON 4 MG PO TBDP: 4.0000 mg | ORAL_TABLET | Freq: Four times a day (QID) | ORAL | Status: DC | PRN

## 2015-09-08 MED ORDER — HYDROCODONE-ACETAMINOPHEN 5-325 MG PO TABS
1.0000 | ORAL_TABLET | ORAL | Status: DC | PRN
  Administered 2015-09-09 – 2015-09-10 (×7): 2 via ORAL
  Filled 2015-09-08 (×7): qty 2

## 2015-09-08 MED ORDER — HYDROMORPHONE HCL 1 MG/ML IJ SOLN
1.0000 mg | INTRAMUSCULAR | Status: DC | PRN
  Administered 2015-09-08 – 2015-09-09 (×3): 1 mg via INTRAVENOUS
  Filled 2015-09-08 (×3): qty 1

## 2015-09-08 MED ORDER — HYDROCODONE-ACETAMINOPHEN 5-325 MG PO TABS
1.0000 | ORAL_TABLET | ORAL | Status: DC | PRN
  Administered 2015-09-08: 2 via ORAL

## 2015-09-08 MED ORDER — BUPIVACAINE-EPINEPHRINE 0.5% -1:200000 IJ SOLN
INTRAMUSCULAR | Status: DC | PRN
  Administered 2015-09-08: 13 mL

## 2015-09-08 MED ORDER — FENTANYL CITRATE (PF) 250 MCG/5ML IJ SOLN
INTRAMUSCULAR | Status: AC
  Filled 2015-09-08: qty 5

## 2015-09-08 MED ORDER — ALPRAZOLAM 0.25 MG PO TABS
0.2500 mg | ORAL_TABLET | Freq: Three times a day (TID) | ORAL | Status: DC | PRN
  Administered 2015-09-08 – 2015-09-09 (×2): 0.25 mg via ORAL
  Filled 2015-09-08 (×2): qty 1

## 2015-09-08 MED ORDER — GLYCOPYRROLATE 0.2 MG/ML IJ SOLN
INTRAMUSCULAR | Status: DC | PRN
  Administered 2015-09-08: 0.6 mg via INTRAVENOUS

## 2015-09-08 MED ORDER — ROCURONIUM 10MG/ML (10ML) SYRINGE FOR MEDFUSION PUMP - OPTIME
INTRAVENOUS | Status: DC | PRN
  Administered 2015-09-08: 30 mg via INTRAVENOUS

## 2015-09-08 MED ORDER — GLYCOPYRROLATE 0.2 MG/ML IJ SOLN
INTRAMUSCULAR | Status: AC
  Filled 2015-09-08: qty 3

## 2015-09-08 MED ORDER — LORAZEPAM 2 MG/ML IJ SOLN
0.5000 mg | Freq: Three times a day (TID) | INTRAMUSCULAR | Status: DC | PRN
  Filled 2015-09-08: qty 1

## 2015-09-08 MED ORDER — CEFAZOLIN SODIUM-DEXTROSE 2-3 GM-% IV SOLR
INTRAVENOUS | Status: DC | PRN
  Administered 2015-09-08: 2 g via INTRAVENOUS

## 2015-09-08 MED ORDER — ONDANSETRON HCL 4 MG/2ML IJ SOLN
INTRAMUSCULAR | Status: DC | PRN
  Administered 2015-09-08: 4 mg via INTRAVENOUS

## 2015-09-08 MED ORDER — EPHEDRINE SULFATE 50 MG/ML IJ SOLN
INTRAMUSCULAR | Status: DC | PRN
  Administered 2015-09-08 (×2): 5 mg via INTRAVENOUS

## 2015-09-08 MED ORDER — CEFAZOLIN SODIUM-DEXTROSE 2-3 GM-% IV SOLR
2.0000 g | INTRAVENOUS | Status: DC
  Filled 2015-09-08: qty 50

## 2015-09-08 MED ORDER — SODIUM CHLORIDE 0.9 % IR SOLN
Status: DC | PRN
  Administered 2015-09-08: 1

## 2015-09-08 MED ORDER — HYDROMORPHONE HCL 1 MG/ML IJ SOLN
0.2500 mg | INTRAMUSCULAR | Status: DC | PRN
  Administered 2015-09-08 (×4): 0.5 mg via INTRAVENOUS

## 2015-09-08 MED ORDER — TAMSULOSIN HCL 0.4 MG PO CAPS
0.4000 mg | ORAL_CAPSULE | Freq: Every day | ORAL | Status: DC
  Administered 2015-09-08 – 2015-09-09 (×2): 0.4 mg via ORAL
  Filled 2015-09-08 (×2): qty 1

## 2015-09-08 MED ORDER — VITAMIN E 180 MG (400 UNIT) PO CAPS
400.0000 [IU] | ORAL_CAPSULE | Freq: Every day | ORAL | Status: DC
  Administered 2015-09-08 – 2015-09-09 (×2): 400 [IU] via ORAL
  Filled 2015-09-08 (×3): qty 1

## 2015-09-08 MED ORDER — DOCUSATE SODIUM 100 MG PO CAPS
100.0000 mg | ORAL_CAPSULE | Freq: Two times a day (BID) | ORAL | Status: DC
  Administered 2015-09-08 – 2015-09-10 (×4): 100 mg via ORAL
  Filled 2015-09-08 (×4): qty 1

## 2015-09-08 MED ORDER — FUROSEMIDE 20 MG PO TABS
20.0000 mg | ORAL_TABLET | Freq: Every day | ORAL | Status: DC
  Administered 2015-09-08 – 2015-09-10 (×3): 20 mg via ORAL
  Filled 2015-09-08 (×3): qty 1

## 2015-09-08 SURGICAL SUPPLY — 46 items
ADH SKN CLS APL DERMABOND .7 (GAUZE/BANDAGES/DRESSINGS) ×1
APPLIER CLIP ROT 10 11.4 M/L (STAPLE) ×3
APR CLP MED LRG 11.4X10 (STAPLE) ×1
BAG SPEC RTRVL LRG 6X4 10 (ENDOMECHANICALS) ×1
BLADE SURG ROTATE 9660 (MISCELLANEOUS) ×2 IMPLANT
CANISTER SUCTION 2500CC (MISCELLANEOUS) ×3 IMPLANT
CHLORAPREP W/TINT 26ML (MISCELLANEOUS) ×3 IMPLANT
CLIP APPLIE ROT 10 11.4 M/L (STAPLE) ×1 IMPLANT
COVER MAYO STAND STRL (DRAPES) ×3 IMPLANT
COVER SURGICAL LIGHT HANDLE (MISCELLANEOUS) ×3 IMPLANT
DERMABOND ADVANCED (GAUZE/BANDAGES/DRESSINGS) ×2
DERMABOND ADVANCED .7 DNX12 (GAUZE/BANDAGES/DRESSINGS) ×1 IMPLANT
DRAIN CHANNEL 19F RND (DRAIN) ×2 IMPLANT
DRAPE C-ARM 42X72 X-RAY (DRAPES) ×3 IMPLANT
ELECT REM PT RETURN 9FT ADLT (ELECTROSURGICAL) ×3
ELECTRODE REM PT RTRN 9FT ADLT (ELECTROSURGICAL) ×1 IMPLANT
EVACUATOR SILICONE 100CC (DRAIN) ×2 IMPLANT
GLOVE BIOGEL PI IND STRL 7.0 (GLOVE) IMPLANT
GLOVE BIOGEL PI INDICATOR 7.0 (GLOVE) ×4
GLOVE EUDERMIC 7 POWDERFREE (GLOVE) ×3 IMPLANT
GLOVE SURG ORTHO 8.0 STRL STRW (GLOVE) ×2 IMPLANT
GLOVE SURG SS PI 7.0 STRL IVOR (GLOVE) ×4 IMPLANT
GOWN STRL REUS W/ TWL LRG LVL3 (GOWN DISPOSABLE) ×2 IMPLANT
GOWN STRL REUS W/ TWL XL LVL3 (GOWN DISPOSABLE) ×1 IMPLANT
GOWN STRL REUS W/TWL LRG LVL3 (GOWN DISPOSABLE) ×6
GOWN STRL REUS W/TWL XL LVL3 (GOWN DISPOSABLE) ×6
HEMOSTAT SNOW SURGICEL 2X4 (HEMOSTASIS) ×2 IMPLANT
KIT BASIN OR (CUSTOM PROCEDURE TRAY) ×3 IMPLANT
KIT ROOM TURNOVER OR (KITS) ×3 IMPLANT
NS IRRIG 1000ML POUR BTL (IV SOLUTION) ×3 IMPLANT
PAD ARMBOARD 7.5X6 YLW CONV (MISCELLANEOUS) ×3 IMPLANT
POUCH SPECIMEN RETRIEVAL 10MM (ENDOMECHANICALS) ×3 IMPLANT
SCISSORS LAP 5X35 DISP (ENDOMECHANICALS) ×3 IMPLANT
SET CHOLANGIOGRAPH 5 50 .035 (SET/KITS/TRAYS/PACK) ×3 IMPLANT
SET IRRIG TUBING LAPAROSCOPIC (IRRIGATION / IRRIGATOR) ×3 IMPLANT
SLEEVE ENDOPATH XCEL 5M (ENDOMECHANICALS) ×3 IMPLANT
SPECIMEN JAR SMALL (MISCELLANEOUS) ×3 IMPLANT
SUT ETHILON 3 0 PS 1 (SUTURE) ×2 IMPLANT
SUT MNCRL AB 4-0 PS2 18 (SUTURE) ×3 IMPLANT
TOWEL OR 17X24 6PK STRL BLUE (TOWEL DISPOSABLE) ×3 IMPLANT
TOWEL OR 17X26 10 PK STRL BLUE (TOWEL DISPOSABLE) ×3 IMPLANT
TRAY LAPAROSCOPIC MC (CUSTOM PROCEDURE TRAY) ×3 IMPLANT
TROCAR XCEL BLUNT TIP 100MML (ENDOMECHANICALS) ×3 IMPLANT
TROCAR XCEL NON-BLD 11X100MML (ENDOMECHANICALS) ×3 IMPLANT
TROCAR XCEL NON-BLD 5MMX100MML (ENDOMECHANICALS) ×3 IMPLANT
TUBING INSUFFLATION (TUBING) ×3 IMPLANT

## 2015-09-08 NOTE — Anesthesia Postprocedure Evaluation (Signed)
  Anesthesia Post-op Note  Patient: Anthony Curry  Procedure(s) Performed: Procedure(s) (LRB): ENDOSCOPIC RETROGRADE CHOLANGIOPANCREATOGRAPHY (ERCP) (N/A)  Patient Location: PACU  Anesthesia Type: General  Level of Consciousness: awake and alert   Airway and Oxygen Therapy: Patient Spontanous Breathing  Post-op Pain: mild  Post-op Assessment: Post-op Vital signs reviewed, Patient's Cardiovascular Status Stable, Respiratory Function Stable, Patent Airway and No signs of Nausea or vomiting  Last Vitals:  Filed Vitals:   09/07/15 1540  BP: 167/68  Pulse: 60  Temp:   Resp: 10    Post-op Vital Signs: stable   Complications: No apparent anesthesia complications

## 2015-09-08 NOTE — Progress Notes (Signed)
Patient shivering and unable to void. VS stable. Dr. Grandville Silos notified order received.

## 2015-09-08 NOTE — Interval H&P Note (Signed)
History and Physical Interval Note:  09/08/2015 11:54 AM  Anthony Curry  has presented today for surgery, with the diagnosis of gallstones  The various methods of treatment have been discussed with the patient and family. After consideration of risks, benefits and other options for treatment, the patient has consented to  Procedure(s): LAPAROSCOPIC CHOLECYSTECTOMY WITH INTRAOPERATIVE CHOLANGIOGRAM POSSIBLE OPEN (N/A) as a surgical intervention .  The patient's history has been reviewed, patient examined, no change in status, stable for surgery.  I have reviewed the patient's chart and labs.  Questions were answered to the patient's satisfaction.     [name]

## 2015-09-08 NOTE — Op Note (Signed)
Patient Name:           Anthony Curry   Date of Surgery:        09/08/2015  Pre op Diagnosis:      Acute and chronic cholecystitis with choledocholithiasis                                       Status post percutaneous cholecystostomy tube                                       Status post recent ERCP with sphincterotomy  Post op Diagnosis:    Same  Procedure:                 Laparoscopic cholecystectomy  Surgeon:                     Edsel Petrin. Dalbert Batman, M.D., FACS  Assistant:                      Armandina Gemma, M.D., [facility]  Operative Indications:   The patient is a 74 year old male who presents with a complaint of acute cholecystitis with choledocholithiasis.He has a cholecystostomy tube cholecystostomy tube. He occasionally has some clear drainage around it but nothing else. His appetite is normal. He doesn't have any abdominal pain. He has stage IV lung cancer, although is disease-free at this time but continues to receive chemotherapy with Dr. Earlie Server.[name]  He is 74 years old. He has stage IV lung cancer but his performance status is good and his lung cancer is under control. 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 Hospitalized 2 months ago with acute cholecystitis and cholecystostomy tube was placed. He has atrial fibrillation on Eliquis. He has recovered and wants to have the tube removed and his gallbladder out. We have cardiac clearance from Dr. [name] who states we can stop his Eliquis 2 days prior to procedures. He has seen Dr. Paulita Fujita.  Choledocholithiasis has been demonstrated on imaging studies.  On week ago Dr. Paulita Fujita attempted ERCP and that was unsuccessful.  A second attempt was performed by Dr. Kathi Der got yesterday and this was successful following radiology placing a wire down his cholecystostomy tube and through the cystic duct and common duct into the duodenum.  Some common duct stones were removed.       The patient did well and was discharged home yesterday.  He  is brought to the operating room today for cholecystectomy  Operative Findings:       The gallbladder was thick-walled and chronically inflamed.  The drain enters the gallbladder through the fundus.  The hepatic flexure of the colon was tethered up to the right lobe of the liver but this was far enough away from the gallbladder that we were able to dissect the structures apart without too much difficulty.  The neck of the gallbladder and the cystic duct were extremely thick walled.  This made the dissection somewhat slow and tedious.  We entered the cystic duct high close to the junction of the cystic duct with the gallbladder and got some clear bile out of it had a couple of tiny stones that I milked out..  Multiple attempts at cholangiogram were unsuccessful because the contrast would simply reflux up and out.  I attempted  insertion of the cholangiogram catheter 5 or 6 times and it was clearly in the lumen but it just would not flow.  I never saw any bile reflux out of the cystic duct.  I felt that we had defined the anatomy adequately and I felt that there was probably obstruction of the distal cystic duct.  Because of the dense chronic inflammation I felt that it was unwise to dissect the cystic duct out distally.  I chose to leave a drain.  The biliary catheter was removed during the case  Procedure in Detail:          Following the induction of general endotracheal anesthesia the patient's abdomen was prepped and draped in the sterile fashion.  We left the biliary drain catheter visible.  Intravenous antibiotics were given.  Surgical timeout was formed.  0.5% Marcaine with epinephrine was used as a local infiltration anesthesia.      A transverse incision was made at the lower rim of the umbilicus.  The fascia was incised in the midline.  The abdominal cavity was carefully entered under direct vision.  An 11 mm Hassan catheter was inserted and secured with the Purstring suture of 0 Vicryl.   Pneumoperitoneum was created and video camera was inserted.  An 11 mm trocar was placed in subxiphoid region and two 5 mm  trochars placed in the right abdomen.  We carefully dissected the hepatic flexure of the colon away from the gallbladder.  We could actually retract the fundus and the infundibulum although they were thick walled.  This was chronic inflammation.  We very slowly and very carefully dissected down at the neck of the gallbladder.  We isolated the cystic artery controlled with metal clips and divided it.  Dissection of the cystic duct was somewhat slow and tedious.  We started up on the gallbladder wall and got behind the gallbladder and cystic duct.  We slowly took the dissection down.  We opened the cystic duct as it entered the gallbladder and got white bile out.  As mentioned above, multiple attempts at cholangiogram was unsuccessful.  We ultimately felt that we had a good critical view and felt that we were far away from the common bile duct.  We felt that the distal cystic duct was probably obstructed.  We controlled the cystic duct with multiple medical clips and divided it.  Gallbladder was dissected from its bed with electrocautery, placed in a specimen bag and removed.    We copiously irrigated the subhepatic and subphrenic spaces.  A couple of tiny stones were retrieved.  A small raw area on the gallbladder bed up near the edge of the liver was controlled with cautery and a piece of hemostatic sponge.  I  chose to place a 69 [place] Blake drain in the subhepatic space and brought that out through one of the lateral trocar sites.  This was sutured to the skin and connected to a suction bulb.  The biliary drainage catheter had been removed during the dissection of the gallbladder.  We checked all the operative areas.  I did not see any bleeding.  I did not see any bile.  The pneumoperitoneum was released.  The trochars were removed.  The fascia at the umbilicus closed with interrupted 0  Vicryl and Purstring suture of 0 Vicryl sutures.  The skin incisions were closed with subcuticular sutures of 4-0 Monocryl and Dermabond.  Patient tolerated procedure well was taken to PACU in stable condition.  EBL 20 mL.  Counts correct.  Complications none.     Edsel Petrin. Dalbert Batman, M.D., FACS General and Minimally Invasive Surgery Breast and Colorectal Surgery  09/08/2015 2:13 PM

## 2015-09-08 NOTE — Anesthesia Procedure Notes (Signed)
Procedure Name: Intubation Date/Time: 09/08/2015 12:43 PM Performed by: Avilene Marrin, Sheron Nightingale Pre-anesthesia Checklist: Patient identified, Timeout performed, Emergency Drugs available, Suction available and Patient being monitored Patient Re-evaluated:Patient Re-evaluated prior to inductionOxygen Delivery Method: Circle system utilized Preoxygenation: Pre-oxygenation with 100% oxygen Intubation Type: IV induction Ventilation: Mask ventilation without difficulty and Oral airway inserted - appropriate to patient size Laryngoscope Size: Mac and 4 Grade View: Grade I Tube size: 7.5 mm Number of attempts: 1 Placement Confirmation: ETT inserted through vocal cords under direct vision,  breath sounds checked- equal and bilateral and positive ETCO2 Secured at: 22 cm Dental Injury: Teeth and Oropharynx as per pre-operative assessment

## 2015-09-08 NOTE — Anesthesia Postprocedure Evaluation (Signed)
Anesthesia Post Note  Patient: Anthony Curry  Procedure(s) Performed: Procedure(s) (LRB): LAPAROSCOPIC CHOLECYSTECTOMY WITH ATTEMPTED INTRAOPERATIVE CHOLANGIOGRAM  (N/A)  Anesthesia type: General  Patient location: PACU  Post pain: Pain level controlled  Post assessment: Post-op Vital signs reviewed  Last Vitals: BP 138/57 mmHg  Pulse 72  Temp(Src) 36.2 C (Oral)  Resp 16  Ht 6' (1.829 m)  Wt 155 lb (70.308 kg)  BMI 21.02 kg/m2  SpO2 100%  Post vital signs: Reviewed  Level of consciousness: sedated  Complications: No apparent anesthesia complications

## 2015-09-08 NOTE — Anesthesia Preprocedure Evaluation (Signed)
Anesthesia Evaluation  Patient identified by MRN, date of birth, ID band Patient awake    Reviewed: Allergy & Precautions, NPO status , Patient's Chart, lab work & pertinent test results  Airway Mallampati: II  TM Distance: >3 FB Neck ROM: Full    Dental no notable dental hx.    Pulmonary pneumonia, resolved, former smoker,  CXR reviewed: Emphysematous changes.   Pulmonary exam normal breath sounds clear to auscultation       Cardiovascular hypertension, Pt. on medications Normal cardiovascular exam+ dysrhythmias Atrial Fibrillation  Rhythm:Regular Rate:Normal  ECHO: EF 55-60%  ECG: SR with PVCs   Neuro/Psych PSYCHIATRIC DISORDERS negative neurological ROS     GI/Hepatic GERD  Medicated,(+)     substance abuse  alcohol use, H/O alcohol abuse.   Endo/Other  negative endocrine ROS  Renal/GU negative Renal ROS     Musculoskeletal  (+) Arthritis ,   Abdominal   Peds  Hematology  (+) anemia ,   Anesthesia Other Findings   Reproductive/Obstetrics negative OB ROS                             Anesthesia Physical  Anesthesia Plan  ASA: III  Anesthesia Plan: General   Post-op Pain Management:    Induction: Intravenous  Airway Management Planned: Oral ETT  Additional Equipment:   Intra-op Plan:   Post-operative Plan: Extubation in OR  Informed Consent: I have reviewed the patients History and Physical, chart, labs and discussed the procedure including the risks, benefits and alternatives for the proposed anesthesia with the patient or authorized representative who has indicated his/her understanding and acceptance.   Dental advisory given  Plan Discussed with: CRNA  Anesthesia Plan Comments:         Anesthesia Quick Evaluation

## 2015-09-08 NOTE — Transfer of Care (Signed)
Immediate Anesthesia Transfer of Care Note  Patient: Anthony Curry  Procedure(s) Performed: Procedure(s): LAPAROSCOPIC CHOLECYSTECTOMY WITH ATTEMPTED INTRAOPERATIVE CHOLANGIOGRAM  (N/A)  Patient Location: PACU  Anesthesia Type:General  Level of Consciousness: awake, alert  and oriented  Airway & Oxygen Therapy: Patient Spontanous Breathing and Patient connected to nasal cannula oxygen  Post-op Assessment: Report given to RN and Post -op Vital signs reviewed and stable  Post vital signs: Reviewed and stable  Last Vitals:  Filed Vitals:   09/08/15 1420  BP: 138/57  Pulse: 79  Temp:   Resp: 10    Complications: No apparent anesthesia complications

## 2015-09-09 ENCOUNTER — Encounter (HOSPITAL_COMMUNITY): Payer: Self-pay | Admitting: General Surgery

## 2015-09-09 DIAGNOSIS — Z7901 Long term (current) use of anticoagulants: Secondary | ICD-10-CM | POA: Diagnosis not present

## 2015-09-09 DIAGNOSIS — I10 Essential (primary) hypertension: Secondary | ICD-10-CM | POA: Diagnosis not present

## 2015-09-09 DIAGNOSIS — E871 Hypo-osmolality and hyponatremia: Secondary | ICD-10-CM | POA: Diagnosis not present

## 2015-09-09 DIAGNOSIS — K8012 Calculus of gallbladder with acute and chronic cholecystitis without obstruction: Secondary | ICD-10-CM | POA: Diagnosis not present

## 2015-09-09 DIAGNOSIS — C3491 Malignant neoplasm of unspecified part of right bronchus or lung: Secondary | ICD-10-CM | POA: Diagnosis not present

## 2015-09-09 DIAGNOSIS — R338 Other retention of urine: Secondary | ICD-10-CM | POA: Diagnosis not present

## 2015-09-09 DIAGNOSIS — I4891 Unspecified atrial fibrillation: Secondary | ICD-10-CM | POA: Diagnosis not present

## 2015-09-09 LAB — CBC
HCT: 34.8 % — ABNORMAL LOW (ref 39.0–52.0)
Hemoglobin: 12.2 g/dL — ABNORMAL LOW (ref 13.0–17.0)
MCH: 32.2 pg (ref 26.0–34.0)
MCHC: 35.1 g/dL (ref 30.0–36.0)
MCV: 91.8 fL (ref 78.0–100.0)
PLATELETS: 199 10*3/uL (ref 150–400)
RBC: 3.79 MIL/uL — AB (ref 4.22–5.81)
RDW: 14.2 % (ref 11.5–15.5)
WBC: 7.5 10*3/uL (ref 4.0–10.5)

## 2015-09-09 LAB — COMPREHENSIVE METABOLIC PANEL
ALK PHOS: 74 U/L (ref 38–126)
ALT: 23 U/L (ref 17–63)
AST: 41 U/L (ref 15–41)
Albumin: 2.8 g/dL — ABNORMAL LOW (ref 3.5–5.0)
Anion gap: 7 (ref 5–15)
BUN: 7 mg/dL (ref 6–20)
CALCIUM: 8.2 mg/dL — AB (ref 8.9–10.3)
CO2: 26 mmol/L (ref 22–32)
CREATININE: 0.76 mg/dL (ref 0.61–1.24)
Chloride: 97 mmol/L — ABNORMAL LOW (ref 101–111)
GFR calc non Af Amer: >60 mL/min (ref >60)
GLUCOSE: 122 mg/dL — AB (ref 65–99)
Potassium: 3.8 mmol/L (ref 3.5–5.1)
SODIUM: 130 mmol/L — AB (ref 135–145)
Total Bilirubin: 0.6 mg/dL (ref 0.3–1.2)
Total Protein: 5 g/dL — ABNORMAL LOW (ref 6.5–8.1)

## 2015-09-09 LAB — LIPASE, BLOOD: LIPASE: 13 U/L — AB (ref 22–51)

## 2015-09-09 MED ORDER — HYDROCODONE-ACETAMINOPHEN 5-325 MG PO TABS: 1.0000 | ORAL_TABLET | Freq: Four times a day (QID) | ORAL | Status: DC | PRN

## 2015-09-09 NOTE — Progress Notes (Signed)
Educational material provided to patient on incision care, JP drain care, foley care, and dressing changes.  Reviewed with patient and wife aseptic technique for changing dressing on JP drain and changing out foley to leg bag and vice versa.  Also reviewed importance of perineal care in prevention of CAUTIs.  Comprehension of material presented ascertained via "teachback" method.  Patient and spouse active participants in education process.

## 2015-09-09 NOTE — Progress Notes (Signed)
Per Dr. Brantley Stage, cancel discharge and let the patient stayed 1 more night.

## 2015-09-09 NOTE — Discharge Summary (Signed)
Patient ID: Anthony Curry [id number] 74 y.o. 1941/09/03  Admit date: 09/08/2015  Discharge date and time: 09/09/2015  Admitting Physician: [name]  Discharge Physician: [name]  Admission Diagnoses: gallstones  Discharge Diagnoses: Choledocholithiasis with cholecystitis                                          Acute urinary retention, recurrent                                          Benign prostatic hypertrophy                                           Non-small cell carcinoma of lung, stage IV, right                                           Hypertension, benign                                           Atrial fibrillation, controlled                                           Hyponatremia, intermittent                                         Operations: Procedure(s): LAPAROSCOPIC CHOLECYSTECTOMY WITH ATTEMPTED INTRAOPERATIVE CHOLANGIOGRAM   Admission Condition: fair  Discharged Condition: fair  Indication for Admission: The patient is a 74 year old male who presents with a complaint of acute cholecystitis with choledocholithiasis.He has a cholecystostomy tube.  His appetite is normal. He doesn't have any abdominal pain. He has stage IV lung cancer, although is disease-free at this time but continues to receive chemotherapy with Dr. Earlie Server.[name]  He is 74 years old. He has stage IV lung cancer but his performance status is good and his lung cancer is under control. 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 Hospitalized 2 months ago with acute cholecystitis and cholecystostomy tube was placed. He has atrial fibrillation on Eliquis. He has recovered and wants to have the tube removed and his gallbladder out. We have cardiac clearance from Dr. [name] who states we can stop his Eliquis 2 days prior to procedures. He has seen Dr. Paulita Fujita. Choledocholithiasis has been demonstrated on imaging studies. On week ago Dr. Paulita Fujita attempted ERCP and that was  unsuccessful. A second attempt was performed by Dr. Watt Climes yesterday and this was successful following radiology placing a wire down his cholecystostomy tube and through the cystic duct and common duct into the duodenum. Some common duct stones were removed.   The patient did well and was discharged home yesterday. He is brought to the operating room today for cholecystectomy  Hospital Course: On the day of admission the  patient was taken the operating room and underwent a laparoscopic cholecystectomy.  We left a drain.    Operative findings are as follows.The gallbladder was thick-walled and chronically inflamed. The drain enters the gallbladder through the fundus. The hepatic flexure of the colon was tethered up to the right lobe of the liver but this was far enough away from the gallbladder that we were able to dissect the structures apart without too much difficulty. The neck of the gallbladder and the cystic duct were extremely thick walled. This made the dissection somewhat slow and tedious. We entered the cystic duct high close to the junction of the cystic duct with the gallbladder and got some clear bile out of it had a couple of tiny stones that I milked out.. Multiple attempts at cholangiogram were unsuccessful because the contrast would simply reflux up and out. I attempted insertion of the cholangiogram catheter 5 or 6 times and it was clearly in the lumen but it just would not flow. I never saw any bile reflux out of the cystic duct. I felt that we had defined the anatomy adequately and I felt that there was probably obstruction of the distal cystic duct. Because of the dense chronic inflammation I felt that it was unwise to dissect the cystic duct out distally. I chose to leave a drain. The biliary catheter was removed during the case.     The patient was observed overnight and remained stable.  He developed recurrent urinary retention which has been a problem in the past.  He  is on Flomax chronically and has been followed by Dr. Tollie Eth.  He was catheterized once for 1100 mL.  On postop day 1 we inserted a Foley catheter and he had another thousand cc in his bladder.  We chose to leave that in place and ask him to follow-up with Dr. Jeralyn Ruths or his associates this coming Friday.  48 hours from now.     Otherwise he was doing well.  Pain control was fairly good.  No nausea or vomiting.  Abdomen was soft with minimal tenderness.  JP drainage serosanguineous.  CBC, complete Medrol panel and lipase were essentially normal.  No evidence of bile leak.  No evidence of bile duct injury.     He was told to resume all his normal medications including the Flomax and eliquis.  He was given a prescription for Norco for pain.  I will see him back in the office in about 5 or 6 days and we will remove the JP drain if it remains nonbilious.  Once again, I asked him to follow-up with urology in 2 days.  Consults: None  Significant Diagnostic Studies: Lab work bladder ultrasound  Treatments: surgery: Laparoscopic cholecystectomy.  Insertion of Foley catheter.  Disposition: Home  Patient Instructions:    Medication List    TAKE these medications        Acidophilus Caps capsule  Take 1 capsule by mouth daily.     ALPRAZolam 0.25 MG tablet  Commonly known as:  XANAX  Take 0.25 mg by mouth 3 (three) times daily as needed for anxiety or sleep. And also at night for sleep.     calcium carbonate 600 MG Tabs tablet  Commonly known as:  OS-CAL  Take 600 mg by mouth daily with breakfast.     demeclocycline 150 MG tablet  Commonly known as:  DECLOMYCIN  Take 150 mg by mouth 2 (two) times daily.     diltiazem 180 MG  24 hr capsule  Commonly known as:  CARDIZEM CD  Take 1 capsule (180 mg total) by mouth daily at 12 noon.     ENSURE COMPLETE PO  Take 1 Can by mouth 2 (two) times daily.     FISH OIL PO  Take 1 tablet by mouth daily.     furosemide 40 MG tablet  Commonly known  as:  LASIX  Take 0.5 tablets (20 mg total) by mouth daily.     HYDROcodone-acetaminophen 5-325 MG tablet  Commonly known as:  NORCO/VICODIN  Take 1-2 tablets by mouth every 4 (four) hours as needed for moderate pain.     HYDROcodone-acetaminophen 5-325 MG tablet  Commonly known as:  NORCO  Take 1-2 tablets by mouth every 6 (six) hours as needed for moderate pain or severe pain.     KLOR-CON M20 20 MEQ tablet  Generic drug:  potassium chloride SA  Take 20 mEq by mouth every morning.     MAGNESIUM PO  Take 400 mg by mouth daily.     mirtazapine 15 MG tablet  Commonly known as:  REMERON  Take 1 tablet (15 mg total) by mouth at bedtime.     multivitamin with minerals Tabs tablet  Take 1 tablet by mouth daily.     OPDIVO IV  Inject as directed every 2 weeks     pantoprazole 40 MG tablet  Commonly known as:  PROTONIX  Take 40 mg by mouth every morning.     SUPER B COMPLEX PO  Take 1 each by mouth daily.     tamsulosin 0.4 MG Caps capsule  Commonly known as:  FLOMAX  Take 0.4 mg by mouth daily at 12 noon.     thiamine 100 MG tablet  Commonly known as:  VITAMIN B-1  Take 1 tablet (100 mg total) by mouth daily.     VITAMIN C PO  Take 1,000 mg by mouth daily.     vitamin E 400 UNIT capsule  Take 400 Units by mouth daily.        Activity: No driving.  No sports or lifting.  Ambulate a lot. Diet: low fat, low cholesterol diet Wound Care: JP drain and Foley care as directed.  Follow-up:  With Dr. Dalbert Batman in 5 days.  Signed: Edsel Petrin. Dalbert Batman, M.D., FACS General and minimally invasive surgery Breast and Colorectal Surgery  09/09/2015, 7:59 AM

## 2015-09-09 NOTE — Progress Notes (Signed)
Gen. Surgery:  Alert and stable.  Complains of incisional pain.  Denies nausea or vomiting. Operative findings discussed with patient and wife. Unable to avoid.  Catheterized 4 1100 mL last night according to RN.  Waiting to see if he can void this morning.  Voids normally at home. Afebrile.  Vital signs stable Abdomen soft.  Scaphoid.  Minimal tenderness.  JP serosanguineous.  Assessment: Stable postop No evidence of bile leak Urinary retention  Plan:  Bladder scan.  Voiding trial. Advance diet Check labs Possible discharge today if meets discharge criteria.   Anthony Curry. Dalbert Batman, M.D., [facility] General and Minimally invasive Surgery Breast and Colorectal Surgery

## 2015-09-10 ENCOUNTER — Ambulatory Visit (HOSPITAL_COMMUNITY): Payer: Medicare Other

## 2015-09-10 DIAGNOSIS — C3491 Malignant neoplasm of unspecified part of right bronchus or lung: Secondary | ICD-10-CM | POA: Diagnosis not present

## 2015-09-10 DIAGNOSIS — E871 Hypo-osmolality and hyponatremia: Secondary | ICD-10-CM | POA: Diagnosis not present

## 2015-09-10 DIAGNOSIS — I10 Essential (primary) hypertension: Secondary | ICD-10-CM | POA: Diagnosis not present

## 2015-09-10 DIAGNOSIS — K8012 Calculus of gallbladder with acute and chronic cholecystitis without obstruction: Secondary | ICD-10-CM | POA: Diagnosis not present

## 2015-09-10 DIAGNOSIS — J9811 Atelectasis: Secondary | ICD-10-CM | POA: Diagnosis not present

## 2015-09-10 DIAGNOSIS — Z7901 Long term (current) use of anticoagulants: Secondary | ICD-10-CM | POA: Diagnosis not present

## 2015-09-10 DIAGNOSIS — R509 Fever, unspecified: Secondary | ICD-10-CM | POA: Diagnosis not present

## 2015-09-10 DIAGNOSIS — I4891 Unspecified atrial fibrillation: Secondary | ICD-10-CM | POA: Diagnosis not present

## 2015-09-10 LAB — URINALYSIS, ROUTINE W REFLEX MICROSCOPIC
BILIRUBIN URINE: NEGATIVE
Glucose, UA: NEGATIVE mg/dL
Ketones, ur: 15 mg/dL — AB
NITRITE: NEGATIVE
PH: 6.5 (ref 5.0–8.0)
Protein, ur: NEGATIVE mg/dL
SPECIFIC GRAVITY, URINE: 1.019 (ref 1.005–1.030)
UROBILINOGEN UA: 0.2 mg/dL (ref 0.0–1.0)

## 2015-09-10 LAB — URINE MICROSCOPIC-ADD ON

## 2015-09-10 MED ORDER — CIPROFLOXACIN HCL 500 MG PO TABS: 500.0000 mg | ORAL_TABLET | Freq: Two times a day (BID) | ORAL | Status: DC

## 2015-09-10 NOTE — Care Management Note (Signed)
Case Management Note  Patient Details  Name: Anthony Curry MRN: [id number] Date of Birth: 10-Jan-1941  Subjective/Objective:                    Action/Plan: Confirmed face sheet information   Expected Discharge Date:                  Expected Discharge Plan:  [place]  In-House Referral:     Discharge planning Services     Post Acute Care Choice:    Choice offered to:  Patient, Spouse  DME Arranged:    DME Agency:     HH Arranged:  RN Marvin Agency:  [facility]  Status of Service:  Completed, signed off  Medicare Important Message Given:    Date Medicare IM Given:    Medicare IM give by:    Date Additional Medicare IM Given:    Additional Medicare Important Message give by:     If discussed at [place] of Stay Meetings, dates discussed:    Additional Comments:  Marilu Favre, RN 09/10/2015, 10:09 AM

## 2015-09-10 NOTE — Discharge Instructions (Signed)
DRAIN CARE:   You have a closed bulb drain to help you heal.  A bulb drain is a small, plastic reservoir which creates a gentle suction. It is used to remove excess fluid from a surgical wound. The color and amount of fluid will vary. Immediately after surgery, the fluid is bright red. It may gradually change to a yellow color. When the amount decreases to about 1 or 2 tablespoons (15 to 30 cc) per 24 hours, your caregiver will usually remove it.  DAILY CARE  Keep the bulb compressed at all times, except while emptying it. The compression creates suction.   Keep sites where the tubes enter the skin dry and covered with a light bandage (dressing).   Tape the tubes to your skin, 1 to 2 inches below the insertion sites, to keep from pulling on your stitches. Tubes are stitched in place and will not slip out.   Pin the bulb to your shirt (not to your pants) with a safety pin.   For the first few days after surgery, there usually is more fluid in the bulb. Empty the bulb whenever it becomes half full because the bulb does not create enough suction if it is too full. Include this amount in your 24 hour totals.   When the amount of drainage decreases, empty the bulb at the same time every day. Write down the amounts and the 24 hour totals. Your caregiver will want to know them. This helps your caregiver know when the tubes can be removed.   (We anticipate removing the drain in 1-3 weeks, depending on when the output is <24m a day for 2+ days)  If there is drainage around the tube sites, change dressings and keep the area dry. If you see a clot in the tube, leave it alone. However, if the tube does not appear to be draining, let your caregiver know.  TO EMPTY THE BULB  Open the stopper to release suction.   Holding the stopper out of the way, pour drainage into the measuring cup that was sent home with you.   Measure and write down the amount. If there are 2 bulbs, note the amount of drainage  from bulb 1 or bulb 2 and keep the totals separate. Your caregiver will want to know which tube is draining more.   Compress the bulb by folding it in half.   Replace the stopper.   Check the tape that holds the tube to your skin, and pin the bulb to your shirt.  SEEK MEDICAL CARE IF:  The drainage develops a bad odor.   You have an oral temperature above 102 F (38.9 C).   The amount of drainage from your wound suddenly increases or decreases.   You accidentally pull out your drain.   You have any other questions or concerns.  MAKE SURE YOU:   Understand these instructions.   Will watch your condition.   Will get help right away if you are not doing well or get worse.     Call our office if you have any questions about your drain. [facility] POST OP INSTRUCTIONS  1. DIET: Follow a light bland diet the first 24 hours after arrival home, such as soup, liquids, crackers, etc.  Be sure to include lots of fluids daily.  Avoid fast food or heavy meals as your are more likely to get nauseated.  Eat a low fat the next few days after surgery.  2. Take your usually prescribed home medications unless otherwise directed. 3. PAIN CONTROL: a. Pain is best controlled by a usual combination of three different methods TOGETHER: i. Ice/Heat ii. Over the counter pain medication iii. Prescription pain medication b. Most patients will experience some swelling and bruising around the incisions.  Ice packs or heating pads (30-60 minutes up to 6 times a day) will help. Use ice for the first few days to help decrease swelling and bruising, then switch to heat to help relax tight/sore spots and speed recovery.  Some people prefer to use ice alone, heat alone, alternating between ice & heat.  Experiment to what works for you.  Swelling and bruising can take several weeks to resolve.   c. It is helpful to take an over-the-counter pain medication regularly for the first few  weeks.  Choose one of the following that works best for you: i. Naproxen (Aleve, etc)  Two '220mg'$  tabs twice a day ii. Ibuprofen (Advil, etc) Three '200mg'$  tabs four times a day (every meal & bedtime) iii. Acetaminophen (Tylenol, etc) 500-'650mg'$  four times a day (every meal & bedtime) d. A  prescription for pain medication (such as oxycodone, hydrocodone, etc) should be given to you upon discharge.  Take your pain medication as prescribed.  i. If you are having problems/concerns with the prescription medicine (does not control pain, nausea, vomiting, rash, itching, etc), please call us [phone] to see if we need to switch you to a different pain medicine that will work better for you and/or control your side effect better. ii. If you need a refill on your pain medication, please contact your pharmacy.  They will contact our office to request authorization. Prescriptions will not be filled after 5 pm or on week-ends. 4. Avoid getting constipated.  Between the surgery and the pain medications, it is common to experience some constipation.  Increasing fluid intake and taking a fiber supplement (such as Metamucil, Citrucel, FiberCon, MiraLax, etc) 1-2 times a day regularly will usually help prevent this problem from occurring.  A mild laxative (prune juice, Milk of Magnesia, MiraLax, etc) should be taken according to package directions if there are no bowel movements after 48 hours.   5. Watch out for diarrhea.  If you have many loose bowel movements, simplify your diet to bland foods & liquids for a few days.  Stop any stool softeners and decrease your fiber supplement.  Switching to mild anti-diarrheal medications (Kayopectate, Pepto Bismol) can help.  If this worsens or does not improve, please call us. 6. Wash / shower every day.  You may shower over the dressings as they are waterproof.  Continue to shower over incision(s) after the dressing is off. 7. Remove your waterproof bandages 5 days after  surgery.  You may leave the incision open to air.  You may replace a dressing/Band-Aid to cover the incision for comfort if you wish.  8. ACTIVITIES as tolerated:   a. You may resume regular (light) daily activities beginning the next day--such as daily self-care, walking, climbing stairs--gradually increasing activities as tolerated.  If you can walk 30 minutes without difficulty, it is safe to try more intense activity such as jogging, treadmill, bicycling, low-impact aerobics, swimming, etc. b. Save the most intensive and strenuous activity for last such as sit-ups, heavy lifting, contact sports, etc  Refrain from any heavy lifting or straining until you are off narcotics for pain control.   c. DO NOT PUSH THROUGH PAIN.  Let pain be  your guide: If it hurts to do something, don't do it.  Pain is your body warning you to avoid that activity for another week until the pain goes down. d. You may drive when you are no longer taking prescription pain medication, you can comfortably wear a seatbelt, and you can safely maneuver your car and apply brakes. e. Dennis Bast may have sexual intercourse when it is comfortable.  9. FOLLOW UP in our office a. Please call [facility] at [phone] to set up an appointment to see your surgeon in the office for a follow-up appointment approximately 2-3 weeks after your surgery. b. Make sure that you call for this appointment the day you arrive home to insure a convenient appointment time. 10. IF YOU HAVE DISABILITY OR FAMILY LEAVE FORMS, BRING THEM TO THE OFFICE FOR PROCESSING.  DO NOT GIVE THEM TO YOUR DOCTOR.   WHEN TO CALL us [phone]: 1. Poor pain control 2. Reactions / problems with new medications (rash/itching, nausea, etc)  3. Fever over 101.5 F (38.5 C) 4. Inability to urinate 5. Nausea and/or vomiting 6. Worsening swelling or bruising 7. Continued bleeding from incision. 8. Increased pain, redness, or drainage from the incision   The clinic staff is  available to answer your questions during regular business hours (8:30am-5pm).  Please dont hesitate to call and ask to speak to one of our nurses for clinical concerns.   If you have a medical emergency, go to the nearest emergency room or call 911.  A surgeon from [facility] is always on call at the [facility], [place], [place], [place], [place]  [address] ? MAIN: [phone] ? TOLL FREE: [phone] ?  FAX [phone] [email or web address]

## 2015-09-10 NOTE — Progress Notes (Signed)
Notified Dr. Hulen Skains of patient's temp up to 101.4. Per Dr. Hulen Skains encourage incentive spirometry. Patient able to pull 1750 on I.S.

## 2015-09-10 NOTE — Progress Notes (Signed)
Anthony Curry to be D/C'd Home per MD order.  Discussed with the patient and all questions fully answered.  VSS, Skin clean, dry and intact without evidence of skin break down, no evidence of skin tears noted. IV catheter discontinued intact. Site without signs and symptoms of complications. Dressing and pressure applied.  JP drain and foley bag teaching with teach back.  An After Visit Summary was printed and given to the patient. Patient received prescription.  D/c education completed with patient/family including follow up instructions, medication list, d/c activities limitations if indicated, with other d/c instructions as indicated by MD - patient able to verbalize understanding, all questions fully answered.   Patient instructed to return to ED, call 911, or call MD for any changes in condition.   Patient escorted via [place], and D/C home via private auto.  Anthony Curry 09/10/2015 11:05 AM

## 2015-09-10 NOTE — Progress Notes (Signed)
Patient ID: Anthony Curry, male   DOB: 03-26-1941, 74 y.o.   MRN: [id number]     [facility]      [address] [place], [place] [address]    Phone: [phone] FAX: [phone]     Subjective: Feels much better.  No nausea.  Passing flatus.  Febrile 101 101.4 last night.  No sob, cough, foley in place.  Ambulating.  Drain with serosanguinous output.    Objective:  Vital signs:  Filed Vitals:   09/09/15 2200 09/09/15 2358 09/10/15 0130 09/10/15 0600  BP: 148/70  156/66 125/60  Pulse: 89  81 72  Temp: 101.3 F (38.5 C) 101.4 F (38.6 C) 99.4 F (37.4 C) 98.4 F (36.9 C)  TempSrc: Oral  Oral Oral  Resp: 18  18 18   Height:      Weight:      SpO2: 92%  91% 95%    Last BM Date: 09/08/15  Intake/Output   Yesterday:  10/05 0701 - 10/06 0700 In: 470 [P.O.:470] Out: 2045 [Urine:2000; Drains:45] This shift: I/O last 3 completed shifts: In: 66 [P.O.:470; I.V.:1405] Out: 6222 [Urine:3130; Drains:110]    Physical Exam: General: Pt awake/alert/oriented x4 in no acute distress Abdomen: Soft.  Nondistended. Non tender.  Drain with serosanguinous output, no bile.  Incisions are c/d/i.   No evidence of peritonitis.  No incarcerated hernias.   Problem List:   Principal Problem:   Choledocholithiasis with cholecystitis Active Problems:   Acute urinary retention    Results:   Labs: Results for orders placed or performed during the hospital encounter of 09/08/15 (from the past 48 hour(s))  CBC     Status: None   Collection Time: 09/08/15  5:40 PM  Result Value Ref Range   WBC 10.2 4.0 - 10.5 K/uL   RBC 4.65 4.22 - 5.81 MIL/uL   Hemoglobin 14.8 13.0 - 17.0 g/dL   HCT 42.8 39.0 - 52.0 %   MCV 92.0 78.0 - 100.0 fL   MCH 31.8 26.0 - 34.0 pg   MCHC 34.6 30.0 - 36.0 g/dL   RDW 14.0 11.5 - 15.5 %   Platelets 254 150 - 400 K/uL  Creatinine, serum     Status: None   Collection Time: 09/08/15  5:40 PM  Result Value Ref  Range   Creatinine, Ser 0.88 0.61 - 1.24 mg/dL   GFR calc non Af Amer >60 >60 mL/min   GFR calc Af Amer >60 >60 mL/min    Comment: (NOTE) The eGFR has been calculated using the CKD EPI equation. This calculation has not been validated in all clinical situations. eGFR's persistently <60 mL/min signify possible Chronic Kidney Disease.   Comprehensive metabolic panel     Status: Abnormal   Collection Time: 09/09/15  5:30 AM  Result Value Ref Range   Sodium 130 (L) 135 - 145 mmol/L   Potassium 3.8 3.5 - 5.1 mmol/L   Chloride 97 (L) 101 - 111 mmol/L   CO2 26 22 - 32 mmol/L   Glucose, Bld 122 (H) 65 - 99 mg/dL   BUN 7 6 - 20 mg/dL   Creatinine, Ser 0.76 0.61 - 1.24 mg/dL   Calcium 8.2 (L) 8.9 - 10.3 mg/dL   Total Protein 5.0 (L) 6.5 - 8.1 g/dL   Albumin 2.8 (L) 3.5 - 5.0 g/dL   AST 41 15 - 41 U/L   ALT 23 17 - 63 U/L   Alkaline Phosphatase 74 38 - 126 U/L  Total Bilirubin 0.6 0.3 - 1.2 mg/dL   GFR calc non Af Amer >60 >60 mL/min   GFR calc Af Amer >60 >60 mL/min    Comment: (NOTE) The eGFR has been calculated using the CKD EPI equation. This calculation has not been validated in all clinical situations. eGFR's persistently <60 mL/min signify possible Chronic Kidney Disease.    Anion gap 7 5 - 15  CBC     Status: Abnormal   Collection Time: 09/09/15  5:30 AM  Result Value Ref Range   WBC 7.5 4.0 - 10.5 K/uL   RBC 3.79 (L) 4.22 - 5.81 MIL/uL   Hemoglobin 12.2 (L) 13.0 - 17.0 g/dL   HCT 34.8 (L) 39.0 - 52.0 %   MCV 91.8 78.0 - 100.0 fL   MCH 32.2 26.0 - 34.0 pg   MCHC 35.1 30.0 - 36.0 g/dL   RDW 14.2 11.5 - 15.5 %   Platelets 199 150 - 400 K/uL  Lipase, blood     Status: Abnormal   Collection Time: 09/09/15  5:30 AM  Result Value Ref Range   Lipase 13 (L) 22 - 51 U/L    Imaging / Studies: Dg Chest Port 1 View  09/10/2015   CLINICAL DATA:  RIGHT lung cancer post chemotherapy, former smoker, fever, hypertension, GERD, atrial fibrillation, post cholecystectomy 09/08/2015   EXAM: PORTABLE CHEST 1 VIEW  COMPARISON:  Portable exam 0925 hours compared to 06/01/2015  FINDINGS: Normal heart size and pulmonary vascularity pre line calcified tortuous thoracic aorta.  Emphysematous changes with bibasilar atelectasis versus scarring.  No acute infiltrate, pleural effusion or pneumothorax.  Diffuse osseous demineralization.  RIGHT glenohumeral degenerative changes with evidence of LEFT shoulder arthroplasty.  Dilated small bowel loops in upper abdomen with gas identified within colon as well.  IMPRESSION: COPD changes with bibasilar scarring.  No acute infiltrate.  Significant small bowel dilatation in upper abdomen, incompletely visualized, could represent postoperative ileus in a patient recently post cholecystectomy.   Electronically Signed   By: Lavonia Dana M.D.   On: 09/10/2015 09:35    Medications / Allergies:  Scheduled Meds: . acidophilus  1 capsule Oral Daily  . calcium carbonate  1 tablet Oral Q breakfast  . demeclocycline  150 mg Oral BID  . diltiazem  180 mg Oral Q1200  . docusate sodium  100 mg Oral BID  . feeding supplement (ENSURE ENLIVE)  237 mL Oral BID BM  . furosemide  20 mg Oral Daily  . heparin  5,000 Units Subcutaneous 3 times per day  . magnesium oxide  400 mg Oral Daily  . multivitamin with minerals  1 tablet Oral Daily  . pantoprazole  40 mg Oral q morning - 10a  . potassium chloride SA  20 mEq Oral q morning - 10a  . tamsulosin  0.4 mg Oral Q1200  . thiamine  100 mg Oral Daily  . vitamin C  1,000 mg Oral Daily  . vitamin E  400 Units Oral Daily   Continuous Infusions: . 0.9 % NaCl with KCl 20 mEq / L 100 mL/hr at 09/09/15 0234   PRN Meds:.ALPRAZolam, HYDROcodone-acetaminophen, HYDROmorphone (DILAUDID) injection, LORazepam, mirtazapine, ondansetron **OR** ondansetron (ZOFRAN) IV, polyethylene glycol, promethazine  Antibiotics: Anti-infectives    Start     Dose/Rate Route Frequency Ordered Stop   09/08/15 2200  demeclocycline (DECLOMYCIN)  tablet 150 mg     150 mg Oral 2 times daily 09/08/15 1608     09/08/15 1215  ceFAZolin (ANCEF) IVPB 2 g/50  mL premix  Status:  Discontinued     2 g 100 mL/hr over 30 Minutes Intravenous To [facility] 09/08/15 1008 09/08/15 1606        Assessment/Plan POD#2 laparoscopic cholecystectomy---Dr. Dalbert Batman  -tolerating a diet, passing flatus, pain well controlled.  Pt had a temp of 101 last night.  Chest x ray is negative for infection.  UA is pending, but foley looks clear.  He is insisting on going home.  He does not appear toxic.  Drain looks serosanguinous.  I will send Rx for cipro x5 days which should also cover if UA is positive.  We discussed warning signs that warrant further evaluation.  Muniz RN ordered to help with drain and assess the patient.  He knows to follow up with Dr. Dalbert Batman next week to recheck his drain.  Urinary retention-he will be discharged with the foley and follow up with Dr. Risa Grill.  Home meds were resumed.  AFib-resume NOAC Dispo-DC home today    Refer to discharge summary dated 09/09/15  Erby Pian, [facility] Pager [phone]) For consults and floor pages call [phone]([id number])  09/10/2015 10:13 AM

## 2015-09-11 DIAGNOSIS — K8060 Calculus of gallbladder and bile duct with cholecystitis, unspecified, without obstruction: Secondary | ICD-10-CM | POA: Diagnosis not present

## 2015-09-11 DIAGNOSIS — I4891 Unspecified atrial fibrillation: Secondary | ICD-10-CM | POA: Diagnosis not present

## 2015-09-11 DIAGNOSIS — R339 Retention of urine, unspecified: Secondary | ICD-10-CM | POA: Diagnosis not present

## 2015-09-11 DIAGNOSIS — Z466 Encounter for fitting and adjustment of urinary device: Secondary | ICD-10-CM | POA: Diagnosis not present

## 2015-09-11 DIAGNOSIS — N400 Enlarged prostate without lower urinary tract symptoms: Secondary | ICD-10-CM | POA: Diagnosis not present

## 2015-09-11 DIAGNOSIS — C3490 Malignant neoplasm of unspecified part of unspecified bronchus or lung: Secondary | ICD-10-CM | POA: Diagnosis not present

## 2015-09-14 DIAGNOSIS — K8060 Calculus of gallbladder and bile duct with cholecystitis, unspecified, without obstruction: Secondary | ICD-10-CM | POA: Diagnosis not present

## 2015-09-14 DIAGNOSIS — C3490 Malignant neoplasm of unspecified part of unspecified bronchus or lung: Secondary | ICD-10-CM | POA: Diagnosis not present

## 2015-09-14 DIAGNOSIS — R339 Retention of urine, unspecified: Secondary | ICD-10-CM | POA: Diagnosis not present

## 2015-09-14 DIAGNOSIS — I4891 Unspecified atrial fibrillation: Secondary | ICD-10-CM | POA: Diagnosis not present

## 2015-09-14 DIAGNOSIS — Z466 Encounter for fitting and adjustment of urinary device: Secondary | ICD-10-CM | POA: Diagnosis not present

## 2015-09-14 DIAGNOSIS — N400 Enlarged prostate without lower urinary tract symptoms: Secondary | ICD-10-CM | POA: Diagnosis not present

## 2015-09-15 DIAGNOSIS — Z466 Encounter for fitting and adjustment of urinary device: Secondary | ICD-10-CM | POA: Diagnosis not present

## 2015-09-15 DIAGNOSIS — N400 Enlarged prostate without lower urinary tract symptoms: Secondary | ICD-10-CM | POA: Diagnosis not present

## 2015-09-15 DIAGNOSIS — C3490 Malignant neoplasm of unspecified part of unspecified bronchus or lung: Secondary | ICD-10-CM | POA: Diagnosis not present

## 2015-09-15 DIAGNOSIS — K8060 Calculus of gallbladder and bile duct with cholecystitis, unspecified, without obstruction: Secondary | ICD-10-CM | POA: Diagnosis not present

## 2015-09-15 DIAGNOSIS — R339 Retention of urine, unspecified: Secondary | ICD-10-CM | POA: Diagnosis not present

## 2015-09-15 DIAGNOSIS — I4891 Unspecified atrial fibrillation: Secondary | ICD-10-CM | POA: Diagnosis not present

## 2015-09-16 ENCOUNTER — Other Ambulatory Visit (HOSPITAL_BASED_OUTPATIENT_CLINIC_OR_DEPARTMENT_OTHER): Payer: Medicare Other

## 2015-09-16 ENCOUNTER — Ambulatory Visit: Payer: Medicare Other

## 2015-09-16 ENCOUNTER — Ambulatory Visit (HOSPITAL_BASED_OUTPATIENT_CLINIC_OR_DEPARTMENT_OTHER): Payer: Medicare Other | Admitting: Internal Medicine

## 2015-09-16 ENCOUNTER — Encounter: Payer: Self-pay | Admitting: Internal Medicine

## 2015-09-16 ENCOUNTER — Telehealth: Payer: Self-pay | Admitting: Internal Medicine

## 2015-09-16 VITALS — BP 152/66 | HR 74 | Temp 97.6°F | Resp 18 | Ht 72.0 in | Wt 150.0 lb

## 2015-09-16 DIAGNOSIS — C7972 Secondary malignant neoplasm of left adrenal gland: Secondary | ICD-10-CM

## 2015-09-16 DIAGNOSIS — E871 Hypo-osmolality and hyponatremia: Secondary | ICD-10-CM

## 2015-09-16 DIAGNOSIS — K819 Cholecystitis, unspecified: Secondary | ICD-10-CM | POA: Diagnosis not present

## 2015-09-16 DIAGNOSIS — C3410 Malignant neoplasm of upper lobe, unspecified bronchus or lung: Secondary | ICD-10-CM

## 2015-09-16 DIAGNOSIS — I4891 Unspecified atrial fibrillation: Secondary | ICD-10-CM | POA: Diagnosis not present

## 2015-09-16 DIAGNOSIS — C3431 Malignant neoplasm of lower lobe, right bronchus or lung: Secondary | ICD-10-CM

## 2015-09-16 DIAGNOSIS — R630 Anorexia: Secondary | ICD-10-CM | POA: Diagnosis not present

## 2015-09-16 DIAGNOSIS — R530 Neoplastic (malignant) related fatigue: Secondary | ICD-10-CM

## 2015-09-16 DIAGNOSIS — Z79899 Other long term (current) drug therapy: Secondary | ICD-10-CM

## 2015-09-16 LAB — COMPREHENSIVE METABOLIC PANEL (CC13)
ALBUMIN: 3.5 g/dL (ref 3.5–5.0)
ALK PHOS: 109 U/L (ref 40–150)
ALT: 26 U/L (ref 0–55)
AST: 28 U/L (ref 5–34)
Anion Gap: 10 mEq/L (ref 3–11)
BILIRUBIN TOTAL: 0.64 mg/dL (ref 0.20–1.20)
BUN: 8.4 mg/dL (ref 7.0–26.0)
CALCIUM: 9.1 mg/dL (ref 8.4–10.4)
CO2: 22 mEq/L (ref 22–29)
Chloride: 99 mEq/L (ref 98–109)
Creatinine: 0.7 mg/dL (ref 0.7–1.3)
GLUCOSE: 108 mg/dL (ref 70–140)
POTASSIUM: 3.8 meq/L (ref 3.5–5.1)
SODIUM: 131 meq/L — AB (ref 136–145)
TOTAL PROTEIN: 6.3 g/dL — AB (ref 6.4–8.3)

## 2015-09-16 LAB — CBC WITH DIFFERENTIAL/PLATELET
BASO%: 0.3 % (ref 0.0–2.0)
Basophils Absolute: 0 10*3/uL (ref 0.0–0.1)
EOS ABS: 0.3 10*3/uL (ref 0.0–0.5)
EOS%: 5.6 % (ref 0.0–7.0)
HEMATOCRIT: 38 % — AB (ref 38.4–49.9)
HEMOGLOBIN: 13.7 g/dL (ref 13.0–17.1)
LYMPH%: 21.5 % (ref 14.0–49.0)
MCH: 32.2 pg (ref 27.2–33.4)
MCHC: 36.1 g/dL — ABNORMAL HIGH (ref 32.0–36.0)
MCV: 89.4 fL (ref 79.3–98.0)
MONO#: 0.7 10*3/uL (ref 0.1–0.9)
MONO%: 12 % (ref 0.0–14.0)
NEUT%: 60.6 % (ref 39.0–75.0)
NEUTROS ABS: 3.6 10*3/uL (ref 1.5–6.5)
Platelets: 304 10*3/uL (ref 140–400)
RBC: 4.25 10*6/uL (ref 4.20–5.82)
RDW: 13.6 % (ref 11.0–14.6)
WBC: 5.9 10*3/uL (ref 4.0–10.3)
lymph#: 1.3 10*3/uL (ref 0.9–3.3)

## 2015-09-16 NOTE — Telephone Encounter (Signed)
pwer pof to sch pt appt-gave pt copy of avs-sent MW email to sch pt trmt-pt aware

## 2015-09-16 NOTE — Progress Notes (Signed)
[facility] [phone]   Fax:([phone] Kir[place], MD [place] [address]  DIAGNOSIS: Stage IV (T1 A., N0, M1 B.) non-small cell lung cancer, adenocarcinoma with negative EGFR mutation and negative ALK gene translocation diagnosed in October of 2014.   Molecular profile: Negative for RET, ALK, BRAF, MET, EGFR.  Positive for ERBB2 A775T, [id number], KRAS G13E, MAP2K1 D67N (see full report).   PRIOR THERAPY:  1) Systemic chemotherapy with carboplatin for AUC of 5 and Alimta 500 mg/M2 every 3 weeks, status post 6 cycles with stable disease. First dose on 10/23/2013. 2) Maintenance systemic chemotherapy with single agent Alimta 500 mg/M2 every 3 weeks. Status post 8 cycles.   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 Sondra Come completed on 12/16/2014.  CURRENT THERAPY: Immunotherapy with Nivolumab 3 MG/KG every 2 weeks, status post 13 cycles. First cycle 01/28/2015  CHEMOTHERAPY INTENT: Palliative  CURRENT # OF CHEMOTHERAPY CYCLES: 13 CURRENT ANTIEMETICS: Zofran, Compazine and dexamethasone  CURRENT SMOKING STATUS: Former smoker  ORAL CHEMOTHERAPY AND CONSENT: None  CURRENT BISPHOSPHONATES USE: None  PAIN MANAGEMENT: 0/10  NARCOTICS INDUCED CONSTIPATION: None  LIVING WILL AND CODE STATUS: ?   INTERVAL HISTORY: Anthony Curry 74 y.o. male returns to the clinic today for followup visit accompanied by his wife. The patient recently underwent ERCP with extraction of the gallbladder stone followed by cholecystectomy by Dr. Dalbert Batman on 09/08/2015. He is recovering slowly from his surgery and continues to have lack of appetite and few days of constipation but this is improved. He is here today for reevaluation before resuming his immunotherapy. He denied having any significant chest pain, shortness of breath, cough or hemoptysis. He denied having any  significant fever or chills, no nausea or vomiting. He was supposed to start cycle #14 today.  MEDICAL HISTORY: Past Medical History  Diagnosis Date  . Hypertension   . GERD (gastroesophageal reflux disease)   . Alcohol abuse   . 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 Low sodium   . Persistent atrial fibrillation ([place]) 04/11/14    chads2vasc score is 2  . Hypotension   . Atrial fibrillation with RVR ([place])     Dr. Allred,cardiology follows LOV 07-30-15  . HCAP (healthcare-associated pneumonia)   . 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 BPH (benign prostatic hyperplasia)   . Hyponatremia   . Anemia of chronic disease   . Fall   . Alcohol intoxication ([place])   . Pulmonary nodules   . Adrenal mass ([place])   . Atrial flutter by electrocardiogram ([place])   . 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 Alcoholism in remission ([place])   . Radiation 12/02/14-12/16/14    Left adrenal gland 30 Gy in 10 fractions  . Dysrhythmia     hx. atrial fibrillation-hx cardiversion- no recent issues- sees Dr. [name]  . Complication of anesthesia 09/08/2015     JITTERY AFTER GALLBLADDER SURGERY  . Anxiety   . Cancer ([place])     small cell/lung  . Neoplastic malignant related fatigue     IV tx. every 2 weeks last9-14-16 (Dr. Earlie Server), radiation last tx. 6 months    ALLERGIES:  has No Known Allergies.  MEDICATIONS:  Current Outpatient Prescriptions  Medication Sig Dispense Refill  . ALPRAZolam (XANAX) 0.25 MG tablet Take 0.25 mg by mouth 3 (three) times daily as needed for anxiety or sleep. And also at night  for sleep.    . Ascorbic Acid (VITAMIN C PO) Take 1,000 mg by mouth daily.     . B Complex-C (SUPER B COMPLEX PO) Take 1 each by mouth daily.     . calcium carbonate (OS-CAL) 600 MG TABS Take 600 mg by mouth daily with breakfast.    . ciprofloxacin (CIPRO) 500 MG tablet Take 1 tablet (500 mg total) by mouth 2 (two) times daily. 10 tablet 0  . demeclocycline (DECLOMYCIN) 150 MG tablet Take 150 mg  by mouth 2 (two) times daily.    [name] diltiazem (CARDIZEM CD) 180 MG 24 hr capsule Take 1 capsule (180 mg total) by mouth daily at 12 noon. 90 capsule 3  . furosemide (LASIX) 40 MG tablet Take 0.5 tablets (20 mg total) by mouth daily. (Patient taking differently: Take 20 mg by mouth every other day. ) 90 tablet 3  . HYDROcodone-acetaminophen (NORCO) 5-325 MG tablet Take 1-2 tablets by mouth every 6 (six) hours as needed for moderate pain or severe pain. 30 tablet 0  . HYDROcodone-acetaminophen (NORCO/VICODIN) 5-325 MG per tablet Take 1-2 tablets by mouth every 4 (four) hours as needed for moderate pain. 30 tablet 0  . KLOR-CON M20 20 MEQ tablet Take 20 mEq by mouth every morning.     . Lactobacillus (ACIDOPHILUS) CAPS capsule Take 1 capsule by mouth daily.    [name] MAGNESIUM PO Take 400 mg by mouth daily.     . mirtazapine (REMERON) 15 MG tablet Take 1 tablet (15 mg total) by mouth at bedtime. (Patient taking differently: Take 15 mg by mouth at bedtime as needed (sleep). ) 30 tablet 0  . Multiple Vitamin (MULTIVITAMIN WITH MINERALS) TABS Take 1 tablet by mouth daily.    . Nivolumab (OPDIVO IV) Inject as directed every 2 weeks    . Nutritional Supplements (ENSURE COMPLETE PO) Take 1 Can by mouth 2 (two) times daily.    . Omega-3 Fatty Acids (FISH OIL PO) Take 1 tablet by mouth daily.    . pantoprazole (PROTONIX) 40 MG tablet Take 40 mg by mouth every morning.  8  . tamsulosin (FLOMAX) 0.4 MG CAPS capsule Take 0.4 mg by mouth daily at 12 noon.     . thiamine (VITAMIN B-1) 100 MG tablet Take 1 tablet (100 mg total) by mouth daily. 30 tablet 6  . vitamin E 400 UNIT capsule Take 400 Units by mouth daily.     No current facility-administered medications for this visit.    SURGICAL HISTORY:  Past Surgical History  Procedure Laterality Date  . Hemorrhoid surgery    . Ankle arthrodesis  1995    rt fx-hardware in  . Tonsillectomy    . Colonoscopy    . Excision / curettage bone cyst finger  2005    rt  thumb  . Total shoulder arthroplasty  02/03/2012    Procedure: TOTAL SHOULDER ARTHROPLASTY;  Surgeon: [name], MD;  Location: [facility];  Service: Orthopedics;  Laterality: Left;  . Cardioversion N/A 09/29/2014    Procedure: CARDIOVERSION;  Surgeon: Josue Hector, MD;  Location[facility] ENDOSCOPY;  Service: Cardiovascular;  Laterality: N/A;  . Percutaneous cholecytostomy tube      IVR insertion 06-13-15(Dr. Vernard Gambles)- 08-28-15 remains in place to drainage bag  . Ercp N/A 09/02/2015    Procedure: ENDOSCOPIC RETROGRADE CHOLANGIOPANCREATOGRAPHY (ERCP);  Surgeon: Arta Silence, MD;  Location: [facility] ENDOSCOPY;  Service: Endoscopy;  Laterality: N/A;  . Ercp N/A 09/07/2015    Procedure: ENDOSCOPIC RETROGRADE  CHOLANGIOPANCREATOGRAPHY (ERCP);  Surgeon: Clarene Essex, MD;  Location: [facility] ENDOSCOPY;  Service: Endoscopy;  Laterality: N/A;  . Cholecystectomy  09/08/2015     laproscopic   . Cholecystectomy N/A 09/08/2015    Procedure: LAPAROSCOPIC CHOLECYSTECTOMY WITH ATTEMPTED INTRAOPERATIVE CHOLANGIOGRAM ;  Surgeon: Fanny Skates, MD;  Location: [facility];  Service: General;  Laterality: N/A;    REVIEW OF SYSTEMS:  A comprehensive review of systems was negative except for: Constitutional: positive for anorexia and fatigue Gastrointestinal: positive for constipation   PHYSICAL EXAMINATION: General appearance: alert, cooperative, fatigued and no distress Head: Normocephalic, without obvious abnormality, atraumatic Neck: no adenopathy, no JVD, supple, symmetrical, trachea midline and thyroid not enlarged, symmetric, no tenderness/mass/nodules Lymph nodes: Cervical, supraclavicular, and axillary nodes normal. Resp: clear to auscultation bilaterally Back: symmetric, no curvature. ROM normal. No CVA tenderness. Cardio: regular rate and rhythm, S1, S2 normal, no murmur, click, rub or gallop GI: Right upper quadrant tenderness to palpation Extremities: extremities normal, atraumatic, no cyanosis or  edema Neurologic: Alert and oriented X 3, normal strength and tone. Normal symmetric reflexes. Normal coordination and gait  ECOG PERFORMANCE STATUS: 1 - Symptomatic but completely ambulatory  Blood pressure 152/66, pulse 74, temperature 97.6 F (36.4 C), temperature source Oral, resp. rate 18, height 6' (1.829 m), weight 150 lb (68.04 kg), SpO2 100 %.  LABORATORY DATA: Lab Results  Component Value Date   WBC 5.9 09/16/2015   HGB 13.7 09/16/2015   HCT 38.0* 09/16/2015   MCV 89.4 09/16/2015   PLT 304 09/16/2015      Chemistry      Component Value Date/Time   NA 130* 09/09/2015 0530   NA 133* 08/19/2015 1021   K 3.8 09/09/2015 0530   K 3.9 08/19/2015 1021   CL 97* 09/09/2015 0530   CO2 26 09/09/2015 0530   CO2 24 08/19/2015 1021   BUN 7 09/09/2015 0530   BUN 12.2 08/19/2015 1021   CREATININE 0.76 09/09/2015 0530   CREATININE 0.7 08/19/2015 1021      Component Value Date/Time   CALCIUM 8.2* 09/09/2015 0530   CALCIUM 9.6 08/19/2015 1021   ALKPHOS 74 09/09/2015 0530   ALKPHOS 93 08/19/2015 1021   AST 41 09/09/2015 0530   AST 27 08/19/2015 1021   ALT 23 09/09/2015 0530   ALT 25 08/19/2015 1021   BILITOT 0.6 09/09/2015 0530   BILITOT 0.79 08/19/2015 1021       RADIOGRAPHIC STUDIES: Ir Fluoro Rm 30-60 Min  2015/09/30  INDICATION: Post technically successful fluoroscopic guided cannulation of cystic and common bile ducts via cholecystostomy tube access. Performing endoscopist, Dr. Watt Climes now requests placement of specific endoscopy wire prior to attempted endoscopy. EXAM: IR FLOURO RM 0-60 MIN COMPARISON:  Fluoroscopic guided cannulation of the duodenum the of the biliary system and percutaneous cholecystostomy tube - earlier same day MEDICATIONS: None. CONTRAST:  10 cc Omnipaque 300 FLUOROSCOPY TIME:  1 minute 30 seconds (24 mGy) COMPLICATIONS: None immediate TECHNIQUE: A timeout was performed prior to the initiation of the procedure. The right upper abdominal quadrant as  well as the external portion of the Bentson wire were prepped and draped in the usual sterile fashion, and a sterile drape was applied covering the operative field. Maximum barrier sterile technique with sterile gowns and gloves were used for the procedure. A timeout was performed prior to the initiation of the procedure. Local anesthesia was provided with 1% lidocaine. Under intermittent fluoroscopic guidance, a 4 French angled glide catheter was advanced over the Bentson wire to  the level of the duodenum. The Bentson wire was removed and contrast injection confirmed appropriate positioning. Next, a endoscopy wire was advanced with end coiled within the horizontal segment of the duodenum. Postprocedural spot fluoroscopic images were obtained. FINDINGS: After successful fluoroscopic guided exchange, the new endoscopy wire is coiled within the horizontal segment of the duodenum. Unchanged positioning of percutaneous cholecystostomy tube with and coiled and locked within the neck of the gallbladder. There is a minimal amount of contrast remaining within the gallbladder fundus. IMPRESSION: Technically successful fluoroscopic guided exchange with new endoscopy wire coiled within the horizontal segment of the duodenum. Electronically Signed   By: Sandi Mariscal M.D.   On: 09/07/2015 15:20   Dg Chest Port 1 View  09/10/2015  CLINICAL DATA:  RIGHT lung cancer post chemotherapy, former smoker, fever, hypertension, GERD, atrial fibrillation, post cholecystectomy 09/08/2015 EXAM: PORTABLE CHEST 1 VIEW COMPARISON:  Portable exam 0925 hours compared to 06/01/2015 FINDINGS: Normal heart size and pulmonary vascularity pre line calcified tortuous thoracic aorta. Emphysematous changes with bibasilar atelectasis versus scarring. No acute infiltrate, pleural effusion or pneumothorax. Diffuse osseous demineralization. RIGHT glenohumeral degenerative changes with evidence of LEFT shoulder arthroplasty. Dilated small bowel loops in  upper abdomen with gas identified within colon as well. IMPRESSION: COPD changes with bibasilar scarring. No acute infiltrate. Significant small bowel dilatation in upper abdomen, incompletely visualized, could represent postoperative ileus in a patient recently post cholecystectomy. Electronically Signed   By: Lavonia Dana M.D.   On: 09/10/2015 09:35   Dg Ercp Biliary & Pancreatic Ducts  09/07/2015  CLINICAL DATA:  ERCP with sphincterotomy for stone removal. EXAM: ERCP TECHNIQUE: Multiple spot images obtained with the fluoroscopic device and submitted for interpretation post-procedure. FLUOROSCOPY TIME:  If the device does not provide the exposure index: Fluoroscopy Time:  7 minutes and 31 second Number of Acquired Images:  2 COMPARISON:  CT 08/03/2015 FINDINGS: A cystic duct wire was placed through the cholecystostomy tube access. Opacification of the extrahepatic biliary system and a wire advanced into the central intrahepatic ducts. Small filling defects in the extrahepatic bile duct could represent gas or small stones. IMPRESSION: ERCP for stone removal. These images were submitted for radiologic interpretation only. Please see the procedural report for the amount of contrast and the fluoroscopy time utilized. Electronically Signed   By: Markus Daft M.D.   On: 09/07/2015 16:31   Dg Ercp Biliary & Pancreatic Ducts  09/02/2015  CLINICAL DATA:  Common bile duct stone. EXAM: ERCP . TECHNIQUE: Multiple spot images obtained with the fluoroscopic device and submitted for interpretation post-procedure. FLUOROSCOPY TIME:  Fluoroscopy Time:  4 minutes 28 seconds. Number of Acquired Images:  3. COMPARISON:  CT scan of August 03, 2015. FINDINGS: Three fluoroscopic images were obtained during ERCP. These images do not include injection of contrast into the common bile duct. Cholecystostomy catheter is noted. IMPRESSION: Submitted images do not include contrast injection into common bile duct. These images were submitted  for radiologic interpretation only. Please see the procedural report for the amount of contrast and the fluoroscopy time utilized. Electronically Signed   By: Marijo Conception, M.D.   On: 09/02/2015 14:24   Ir Exchange Biliary Drain  09/07/2015  INDICATION: History of acute cholecystitis, post ultrasound and fluoroscopic guided cholecystostomy tube placement on 06/13/2015. Patient underwent percutaneous cholangiogram via existing cholecystostomy tube on 07/21/2015 which demonstrated findings of choledocholithiasis with discrete filling defects within the cystic and common bile ducts. Patient underwent attempted ERCP on 09/02/2015 though the CBD  could not be cannulated. As such, request made for fluoroscopic guided placement of a wire through the CBD, cystic duct and common bile duct to the level of the duodenum to aid in cannulation of the CBD for biliary stent placement prior to impending laparoscopic cholecystectomy. EXAM: 1. IR EXCHANGE BILARY DRAIN 2. PERCUTANEOUS CHOLANGIOGRAM VIA EXISTING CHOLECYSTOSTOMY TUBE COMPARISON:  Ultrasound fluoroscopic guided cholecystostomy tube placement - 06/13/2015; percutaneous cholangiogram via existing cholecystostomy tube - 07/11/2015; CT the abdomen pelvis - 08/03/2015 MEDICATIONS: Ciprofloxacin 400 mg IV - the antibiotic was administered within an appropriate time frame prior to the initiation of the procedure Fentanyl 50 mcg IV; Versed 3 mg IV. Sedation time: 19 minutes CONTRAST:  20 cc Omnipaque 300, administered into the biliary tree FLUOROSCOPY TIME:  6 minutes 24 seconds (51 mGy) COMPLICATIONS: None immediate TECHNIQUE: Informed written consent was obtained from the patient after a discussion of the risks, benefits and alternatives to treatment. Questions regarding the procedure were encouraged and answered. A timeout was performed prior to the initiation of the procedure. The right upper abdominal quadrant and external portion of the existing cholecystostomy tube  was prepped and draped in the usual sterile fashion, and a sterile drape was applied covering the operative field. Maximum barrier sterile technique with sterile gowns and gloves were used for the procedure. A timeout was performed prior to the initiation of the procedure. Local anesthesia was provided with 1% lidocaine. A preprocedural spot image was obtained of the right upper abdominal quadrant existing percutaneous cholecystostomy tube. Multiple spot fluoroscopic and radiographic images were obtained following the injection of small amount of contrast via the existing cholecystostomy tube. The external portion of the cholecystostomy tube was cut and cannulated with a short Amplatz wire which was coiled within the gallbladder fundus. Under intermittent fluoroscopic guidance, the existing cholecystostomy tube exchanged for a 8 French radiopaque tip vascular sheath after the overlying soft tissues were anesthetized with 1% lidocaine. With the use of a Kumpe catheter, a regular glidewire was advanced through the cystic and common bile ducts to the level of the duodenum. Contrast injection confirmed appropriate positioning. This allowed for placement of a Bentson wire with end coiled and locked within the descending portion of the duodenum. External portion of the Bentson wire was secured at the skin entrance site with a metallic clamp. Next, over an Amplatz wire, the vascular sheath was exchanged for a new 10 [place] cholecystostomy tube with end coiled and locked within in the neck of the gallbladder. Contrast injection confirmed appropriate positioning. FINDINGS: Preprocedural spot fluoroscopic image demonstrates unchanged positioning of the cholecystostomy tube with end coiled and locked over the expected location of the gallbladder fundus. Contrast injection confirmed appropriate positioning and functionality of the cholecystostomy tube. There is a persistent nearly occlusive filling defect within the mid aspect  of the cystic duct compatible with a gallstone. Additionally, there are several (at least 4) persistent filling defects within the CBD compatible with choledocholithiasis. There is minimal passage of contrast in the distal aspect of the CBD to the level of the duodenum. Following successful fluoroscopic guided cannulation, a percutaneous wire is advanced through the gallbladder, cystic duct and CBD with and coiled within the descending portion of the duodenum. Following fluoroscopic guided exchange, a new 10 French percutaneous cholecystostomy tube end is coiled and locked within the gallbladder. IMPRESSION: 1. Successful fluoroscopic guided advancement of a percutaneous wire through the gallbladder, cystic and common bile ducts to the level of the duodenum to be used during impending ERCP  and biliary stent placement. 2. Successful fluoroscopic guided exchange of 10 French percutaneous cholecystostomy tube. Electronically Signed   By: Sandi Mariscal M.D.   On: 09/07/2015 13:57   ASSESSMENT AND PLAN: This is a very pleasant 74 years old white male with:  1)  Stage IV non-small cell lung cancer, adenocarcinoma status post induction chemotherapy with carboplatin and Alimta and currently on maintenance treatment with single agent Alimta status post 8 cycles. He has been observation for the last few months.  He had evidence for disease progression on the left adrenal gland and the patient underwent palliative radiotherapy to the left adrenal gland under the care of Dr. Sondra Come completed on 12/16/2014. He is currently undergoing treatment with immunotherapy with Nivolumab status post 12 cycles and tolerating his treatment fairly well. The patient had cholecystectomy last week and he is still recovering from the recent surgery. I will delay the start of cycle #14 by 1 week to give him more time for recovery after the surgery.  2) acute cholecystitis: Status post cholecystectomy on 09/08/2015 under the care of Dr.  Dalbert Batman.  3) Atrial fibrillation: He'll continue his current medication with Cardizem, digoxin and metoprolol in addition to Eliquis. He'll also continue his routine followup visit with his cardiologist Dr. [name].  4) history of hyponatremia: He will discontinue his treatment with demeclocycline for now and try some salt tablets. We will continue to monitor his lab work closely. He would come back for follow-up visit in 3 weeks for reevaluation before starting cycle #15. He was advised to call immediately if he has any concerning symptoms in the interval. The patient voices understanding of current disease status and treatment options and is in agreement with the current care plan.  All questions were answered. The patient knows to call the clinic with any problems, questions or concerns. We can certainly see the patient much sooner if necessary.  Disclaimer: This note was dictated with voice recognition software. Similar sounding words can inadvertently be transcribed and may not be corrected upon review.

## 2015-09-17 ENCOUNTER — Telehealth: Payer: Self-pay | Admitting: Internal Medicine

## 2015-09-17 ENCOUNTER — Telehealth: Payer: Self-pay | Admitting: *Deleted

## 2015-09-17 DIAGNOSIS — R339 Retention of urine, unspecified: Secondary | ICD-10-CM | POA: Diagnosis not present

## 2015-09-17 DIAGNOSIS — N138 Other obstructive and reflux uropathy: Secondary | ICD-10-CM | POA: Diagnosis not present

## 2015-09-17 DIAGNOSIS — N401 Enlarged prostate with lower urinary tract symptoms: Secondary | ICD-10-CM | POA: Diagnosis not present

## 2015-09-17 NOTE — Telephone Encounter (Signed)
Per staff message and POF I have scheduled appts. Advised scheduler of appts and to move labs. JMW  

## 2015-09-17 NOTE — Telephone Encounter (Signed)
per pof to sch pt appt-cld & spoke to pt and adv of time & date of appt for 10/19-pt understood

## 2015-09-22 ENCOUNTER — Other Ambulatory Visit: Payer: Self-pay | Admitting: Medical Oncology

## 2015-09-22 DIAGNOSIS — C3431 Malignant neoplasm of lower lobe, right bronchus or lung: Secondary | ICD-10-CM

## 2015-09-23 ENCOUNTER — Other Ambulatory Visit: Payer: Medicare Other

## 2015-09-23 ENCOUNTER — Ambulatory Visit (HOSPITAL_BASED_OUTPATIENT_CLINIC_OR_DEPARTMENT_OTHER): Payer: Medicare Other

## 2015-09-23 ENCOUNTER — Other Ambulatory Visit (HOSPITAL_BASED_OUTPATIENT_CLINIC_OR_DEPARTMENT_OTHER): Payer: Medicare Other

## 2015-09-23 DIAGNOSIS — Z5112 Encounter for antineoplastic immunotherapy: Secondary | ICD-10-CM | POA: Diagnosis present

## 2015-09-23 DIAGNOSIS — C7972 Secondary malignant neoplasm of left adrenal gland: Secondary | ICD-10-CM

## 2015-09-23 DIAGNOSIS — C3431 Malignant neoplasm of lower lobe, right bronchus or lung: Secondary | ICD-10-CM

## 2015-09-23 DIAGNOSIS — C3410 Malignant neoplasm of upper lobe, unspecified bronchus or lung: Secondary | ICD-10-CM

## 2015-09-23 LAB — CBC WITH DIFFERENTIAL/PLATELET
BASO%: 0.6 % (ref 0.0–2.0)
BASOS ABS: 0 10*3/uL (ref 0.0–0.1)
EOS ABS: 0.3 10*3/uL (ref 0.0–0.5)
EOS%: 5.1 % (ref 0.0–7.0)
HCT: 37.2 % — ABNORMAL LOW (ref 38.4–49.9)
HEMOGLOBIN: 13.2 g/dL (ref 13.0–17.1)
LYMPH%: 23.1 % (ref 14.0–49.0)
MCH: 32.1 pg (ref 27.2–33.4)
MCHC: 35.5 g/dL (ref 32.0–36.0)
MCV: 90.5 fL (ref 79.3–98.0)
MONO#: 0.6 10*3/uL (ref 0.1–0.9)
MONO%: 11.8 % (ref 0.0–14.0)
NEUT#: 3.2 10*3/uL (ref 1.5–6.5)
NEUT%: 59.4 % (ref 39.0–75.0)
Platelets: 373 10*3/uL (ref 140–400)
RBC: 4.11 10*6/uL — AB (ref 4.20–5.82)
RDW: 13.8 % (ref 11.0–14.6)
WBC: 5.3 10*3/uL (ref 4.0–10.3)
lymph#: 1.2 10*3/uL (ref 0.9–3.3)

## 2015-09-23 LAB — COMPREHENSIVE METABOLIC PANEL (CC13)
ALK PHOS: 109 U/L (ref 40–150)
ALT: 16 U/L (ref 0–55)
AST: 19 U/L (ref 5–34)
Albumin: 3.6 g/dL (ref 3.5–5.0)
Anion Gap: 8 mEq/L (ref 3–11)
BUN: 13.6 mg/dL (ref 7.0–26.0)
CHLORIDE: 102 meq/L (ref 98–109)
CO2: 24 meq/L (ref 22–29)
Calcium: 9.3 mg/dL (ref 8.4–10.4)
Creatinine: 0.7 mg/dL (ref 0.7–1.3)
GLUCOSE: 106 mg/dL (ref 70–140)
POTASSIUM: 3.8 meq/L (ref 3.5–5.1)
SODIUM: 133 meq/L — AB (ref 136–145)
Total Bilirubin: 0.56 mg/dL (ref 0.20–1.20)
Total Protein: 6.2 g/dL — ABNORMAL LOW (ref 6.4–8.3)

## 2015-09-23 MED ORDER — SODIUM CHLORIDE 0.9 % IV SOLN
Freq: Once | INTRAVENOUS | Status: AC
  Administered 2015-09-23: 14:00:00 via INTRAVENOUS

## 2015-09-23 MED ORDER — SODIUM CHLORIDE 0.9 % IV SOLN
240.0000 mg | Freq: Once | INTRAVENOUS | Status: AC
  Administered 2015-09-23: 240 mg via INTRAVENOUS
  Filled 2015-09-23: qty 8

## 2015-09-23 NOTE — Patient Instructions (Signed)
[facility] Discharge Instructions for Patients  Today you received the following: Nivolumab   To help prevent nausea and vomiting after your treatment, we encourage you to take your nausea medication as directed.    If you develop nausea and vomiting that is not controlled by your nausea medication, call the clinic.   BELOW ARE SYMPTOMS THAT SHOULD BE REPORTED IMMEDIATELY:  *FEVER GREATER THAN 100.5 F  *CHILLS WITH OR WITHOUT FEVER  NAUSEA AND VOMITING THAT IS NOT CONTROLLED WITH YOUR NAUSEA MEDICATION  *UNUSUAL SHORTNESS OF BREATH  *UNUSUAL BRUISING OR BLEEDING  TENDERNESS IN MOUTH AND THROAT WITH OR WITHOUT PRESENCE OF ULCERS  *URINARY PROBLEMS  *BOWEL PROBLEMS  UNUSUAL RASH Items with * indicate a potential emergency and should be followed up as soon as possible.  Feel free to call the clinic you have any questions or concerns. The clinic phone number is [phone].  Please show the CHEMO ALERT CARD at check-in to the Emergency Department and triage nurse.   

## 2015-10-06 ENCOUNTER — Other Ambulatory Visit: Payer: Self-pay | Admitting: Internal Medicine

## 2015-10-07 ENCOUNTER — Other Ambulatory Visit (HOSPITAL_BASED_OUTPATIENT_CLINIC_OR_DEPARTMENT_OTHER): Payer: Medicare Other

## 2015-10-07 ENCOUNTER — Ambulatory Visit (HOSPITAL_BASED_OUTPATIENT_CLINIC_OR_DEPARTMENT_OTHER): Payer: Medicare Other

## 2015-10-07 ENCOUNTER — Ambulatory Visit (HOSPITAL_BASED_OUTPATIENT_CLINIC_OR_DEPARTMENT_OTHER): Payer: Medicare Other | Admitting: Physician Assistant

## 2015-10-07 ENCOUNTER — Telehealth: Payer: Self-pay | Admitting: Internal Medicine

## 2015-10-07 VITALS — BP 138/63 | HR 99 | Temp 97.9°F | Resp 18 | Ht 72.0 in | Wt 149.9 lb

## 2015-10-07 DIAGNOSIS — Z5112 Encounter for antineoplastic immunotherapy: Secondary | ICD-10-CM | POA: Diagnosis present

## 2015-10-07 DIAGNOSIS — C3490 Malignant neoplasm of unspecified part of unspecified bronchus or lung: Secondary | ICD-10-CM

## 2015-10-07 DIAGNOSIS — E871 Hypo-osmolality and hyponatremia: Secondary | ICD-10-CM | POA: Diagnosis not present

## 2015-10-07 DIAGNOSIS — I4891 Unspecified atrial fibrillation: Secondary | ICD-10-CM | POA: Diagnosis not present

## 2015-10-07 DIAGNOSIS — Z79899 Other long term (current) drug therapy: Secondary | ICD-10-CM

## 2015-10-07 DIAGNOSIS — C7972 Secondary malignant neoplasm of left adrenal gland: Secondary | ICD-10-CM

## 2015-10-07 DIAGNOSIS — C3431 Malignant neoplasm of lower lobe, right bronchus or lung: Secondary | ICD-10-CM

## 2015-10-07 DIAGNOSIS — R42 Dizziness and giddiness: Secondary | ICD-10-CM

## 2015-10-07 LAB — CBC WITH DIFFERENTIAL/PLATELET
BASO%: 0.6 % (ref 0.0–2.0)
Basophils Absolute: 0 10*3/uL (ref 0.0–0.1)
EOS%: 9.1 % — ABNORMAL HIGH (ref 0.0–7.0)
Eosinophils Absolute: 0.4 10*3/uL (ref 0.0–0.5)
HEMATOCRIT: 42.8 % (ref 38.4–49.9)
HEMOGLOBIN: 14.4 g/dL (ref 13.0–17.1)
LYMPH%: 26.7 % (ref 14.0–49.0)
MCH: 31.5 pg (ref 27.2–33.4)
MCHC: 33.5 g/dL (ref 32.0–36.0)
MCV: 93.8 fL (ref 79.3–98.0)
MONO#: 0.4 10*3/uL (ref 0.1–0.9)
MONO%: 9.8 % (ref 0.0–14.0)
NEUT%: 53.8 % (ref 39.0–75.0)
NEUTROS ABS: 2.1 10*3/uL (ref 1.5–6.5)
PLATELETS: 244 10*3/uL (ref 140–400)
RBC: 4.56 10*6/uL (ref 4.20–5.82)
RDW: 14.3 % (ref 11.0–14.6)
WBC: 3.9 10*3/uL — AB (ref 4.0–10.3)
lymph#: 1.1 10*3/uL (ref 0.9–3.3)

## 2015-10-07 LAB — COMPREHENSIVE METABOLIC PANEL (CC13)
ALK PHOS: 91 U/L (ref 40–150)
ALT: 17 U/L (ref 0–55)
AST: 22 U/L (ref 5–34)
Albumin: 4 g/dL (ref 3.5–5.0)
Anion Gap: 9 mEq/L (ref 3–11)
BUN: 13.9 mg/dL (ref 7.0–26.0)
CHLORIDE: 101 meq/L (ref 98–109)
CO2: 24 meq/L (ref 22–29)
Calcium: 9.9 mg/dL (ref 8.4–10.4)
Creatinine: 0.8 mg/dL (ref 0.7–1.3)
EGFR: 88 mL/min/{1.73_m2} — AB (ref >90)
GLUCOSE: 130 mg/dL (ref 70–140)
POTASSIUM: 3.5 meq/L (ref 3.5–5.1)
SODIUM: 134 meq/L — AB (ref 136–145)
Total Bilirubin: 0.78 mg/dL (ref 0.20–1.20)
Total Protein: 6.5 g/dL (ref 6.4–8.3)

## 2015-10-07 LAB — TSH CHCC: TSH: 3.394 m[IU]/L (ref 0.320–4.118)

## 2015-10-07 MED ORDER — SODIUM CHLORIDE 0.9 % IV SOLN
240.0000 mg | Freq: Once | INTRAVENOUS | Status: AC
  Administered 2015-10-07: 240 mg via INTRAVENOUS
  Filled 2015-10-07: qty 24

## 2015-10-07 MED ORDER — SODIUM CHLORIDE 0.9 % IV SOLN
Freq: Once | INTRAVENOUS | Status: AC
  Administered 2015-10-07: 12:00:00 via INTRAVENOUS

## 2015-10-07 NOTE — Progress Notes (Signed)
[facility] [phone]   Fax:([phone] [place], [place] [address]  DIAGNOSIS: Stage IV (T1 A., N0, M1 B.) non-small cell lung cancer, adenocarcinoma with negative EGFR mutation and negative ALK gene translocation diagnosed in October of 2014.   Molecular profile: Negative for RET, ALK, BRAF, MET, EGFR.  Positive for ERBB2 A775T, ERX5Q008Q, KRAS G13E, MAP2K1 D67N (see full report).   PRIOR THERAPY:  1) Systemic chemotherapy with carboplatin for AUC of 5 and Alimta 500 mg/M2 every 3 weeks, status post 6 cycles with stable disease. First dose on 10/23/2013. 2) Maintenance systemic chemotherapy with single agent Alimta 500 mg/M2 every 3 weeks. Status post 8 cycles.   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 Sondra Come completed on 12/16/2014.  CURRENT THERAPY: Immunotherapy with Nivolumab 3 MG/KG every 2 weeks, status post 14 cycles. First cycle 01/28/2015  CHEMOTHERAPY INTENT: Palliative  CURRENT # OF CHEMOTHERAPY CYCLES: 14 CURRENT ANTIEMETICS: Zofran, Compazine and dexamethasone  CURRENT SMOKING STATUS: Former smoker  ORAL CHEMOTHERAPY AND CONSENT: None  CURRENT BISPHOSPHONATES USE: None  PAIN MANAGEMENT: 0/10  NARCOTICS INDUCED CONSTIPATION: None  LIVING WILL AND CODE STATUS: ?   INTERVAL HISTORY: Anthony Curry 74 y.o. male returns to the clinic today for followup visit accompanied by his wife. He is recovering slowly from his surgery and his appetite has improved.  He denies constipation or diarrhea. He is here today for cycle 15 chemotherapy. He denied having any significant chest pain, palpitations, shortness of breath, cough or hemoptysis. He denied having any significant fever or chills, no nausea or vomiting. He reports chronic, longer than 9 months, history of vertigo when closing his eyes, for which he is to see his primary  doctor next week for further workup. Denies any bleeding issues.  MEDICAL HISTORY: Past Medical History  Diagnosis Date  . Hypertension   . GERD (gastroesophageal reflux disease)   . Alcohol abuse   . 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 Low sodium   . Persistent atrial fibrillation ([facility]) 04/11/14    chads2vasc score is 2  . Hypotension   . Atrial fibrillation with RVR ([facility])     Dr. Allred,cardiology follows LOV 07-30-15  . HCAP (healthcare-associated pneumonia)   . 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[facility])   . BPH (benign prostatic hyperplasia)   . Hyponatremia   . Anemia of chronic disease   . Fall   . Alcohol intoxication ([facility])   . Pulmonary nodules   . Adrenal mass ([facility])   . Atrial flutter by electrocardiogram ([facility])   . 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 Alcoholism in remission ([facility])   . Radiation 12/02/14-12/16/14    Left adrenal gland 30 Gy in 10 fractions  . Dysrhythmia     hx. atrial fibrillation-hx cardiversion- no recent issues- sees Dr. [name]  . Complication of anesthesia 09/08/2015     JITTERY AFTER GALLBLADDER SURGERY  . Anxiety   . Cancer ([facility])     small cell/lung  . Neoplastic malignant related fatigue     IV tx. every 2 weeks last9-14-16 (Dr. Earlie Server), radiation last tx. 6 months    ALLERGIES:  has No Known Allergies.  MEDICATIONS:  Current Outpatient Prescriptions  Medication Sig Dispense Refill  . ALPRAZolam (XANAX) 0.25 MG tablet Take 0.25 mg by mouth 3 (three) times daily as needed for anxiety or sleep. And also at night for  sleep.    . Ascorbic Acid (VITAMIN C PO) Take 1,000 mg by mouth daily.     . B Complex-C (SUPER B COMPLEX PO) Take 1 each by mouth daily.     . calcium carbonate (OS-CAL) 600 MG TABS Take 600 mg by mouth daily with breakfast.    . demeclocycline (DECLOMYCIN) 150 MG tablet Take 150 mg by mouth 2 (two) times daily.    [name] diltiazem (CARDIZEM CD) 180 MG 24 hr capsule Take 1 capsule (180 mg total) by mouth daily at 12 noon.  90 capsule 3  . furosemide (LASIX) 40 MG tablet Take 0.5 tablets (20 mg total) by mouth daily. (Patient taking differently: Take 20 mg by mouth every other day. ) 90 tablet 3  . HYDROcodone-acetaminophen (NORCO) 5-325 MG tablet Take 1-2 tablets by mouth every 6 (six) hours as needed for moderate pain or severe pain. 30 tablet 0  . HYDROcodone-acetaminophen (NORCO/VICODIN) 5-325 MG per tablet Take 1-2 tablets by mouth every 4 (four) hours as needed for moderate pain. 30 tablet 0  . KLOR-CON M20 20 MEQ tablet Take 20 mEq by mouth every morning.     . Lactobacillus (ACIDOPHILUS) CAPS capsule Take 1 capsule by mouth daily.    [name] MAGNESIUM PO Take 400 mg by mouth daily.     . mirtazapine (REMERON) 15 MG tablet Take 1 tablet (15 mg total) by mouth at bedtime. (Patient taking differently: Take 15 mg by mouth at bedtime as needed (sleep). ) 30 tablet 0  . Multiple Vitamin (MULTIVITAMIN WITH MINERALS) TABS Take 1 tablet by mouth daily.    . Nivolumab (OPDIVO IV) Inject as directed every 2 weeks    . Nutritional Supplements (ENSURE COMPLETE PO) Take 1 Can by mouth 2 (two) times daily.    . Omega-3 Fatty Acids (FISH OIL PO) Take 1 tablet by mouth daily.    . pantoprazole (PROTONIX) 40 MG tablet Take 40 mg by mouth every morning.  8  . tamsulosin (FLOMAX) 0.4 MG CAPS capsule Take 0.4 mg by mouth daily at 12 noon.     . thiamine (VITAMIN B-1) 100 MG tablet Take 1 tablet (100 mg total) by mouth daily. 30 tablet 6  . vitamin E 400 UNIT capsule Take 400 Units by mouth daily.     No current facility-administered medications for this visit.    SURGICAL HISTORY:  Past Surgical History  Procedure Laterality Date  . Hemorrhoid surgery    . Ankle arthrodesis  1995    rt fx-hardware in  . Tonsillectomy    . Colonoscopy    . Excision / curettage bone cyst finger  2005    rt thumb  . Total shoulder arthroplasty  02/03/2012    Procedure: TOTAL SHOULDER ARTHROPLASTY;  Surgeon: [name], MD;  Location: [facility];  Service: Orthopedics;  Laterality: Left;  . Cardioversion N/A 09/29/2014    Procedure: CARDIOVERSION;  Surgeon: Josue Hector, MD;  Location: [facility] ENDOSCOPY;  Service: Cardiovascular;  Laterality: N/A;  . Percutaneous cholecytostomy tube      IVR insertion 06-13-15(Dr. Vernard Gambles)- 08-28-15 remains in place to drainage bag  . Ercp N/A 09/02/2015    Procedure: ENDOSCOPIC RETROGRADE CHOLANGIOPANCREATOGRAPHY (ERCP);  Surgeon: Arta Silence, MD;  Location: [facility] ENDOSCOPY;  Service: Endoscopy;  Laterality: N/A;  . Ercp N/A 09/07/2015    Procedure: ENDOSCOPIC RETROGRADE CHOLANGIOPANCREATOGRAPHY (ERCP);  Surgeon: Clarene Essex, MD;  Location: [facility] ENDOSCOPY;  Service: Endoscopy;  Laterality: N/A;  . Cholecystectomy  09/08/2015  laproscopic   . Cholecystectomy N/A 09/08/2015    Procedure: LAPAROSCOPIC CHOLECYSTECTOMY WITH ATTEMPTED INTRAOPERATIVE CHOLANGIOGRAM ;  Surgeon: Fanny Skates, MD;  Location: [facility];  Service: General;  Laterality: N/A;    REVIEW OF SYSTEMS:  A comprehensive review of systems was negative except for chronic vertigo.  PHYSICAL EXAMINATION: General appearance: alert, cooperative, fatigued and no distress Head: Normocephalic, without obvious abnormality, atraumatic Neck: no adenopathy, no JVD, supple, symmetrical, trachea midline and thyroid not enlarged, symmetric, no tenderness/mass/nodules Lymph nodes: Cervical, supraclavicular, and axillary nodes normal. Resp: clear to auscultation bilaterally Back: symmetric, no curvature. ROM normal. No CVA tenderness. Cardio: regular rate and rhythm, S1, S2 normal, no murmur, click, rub or gallop GI: Right upper quadrant tenderness to palpation Extremities: extremities normal, atraumatic, no cyanosis or edema Neurologic: Alert and oriented X 3, normal strength and tone. Normal symmetric reflexes. Normal coordination. Gait not tested due to vertigo, he uses a cane to ambulate.   ECOG PERFORMANCE STATUS: 1 - Symptomatic but  completely ambulatory  Blood pressure 138/63, pulse 99, temperature 97.9 F (36.6 C), temperature source Oral, resp. rate 18, height 6' (1.829 m), weight 149 lb 14.4 oz (67.994 kg), SpO2 98 %.  LABORATORY DATA: CBC Latest Ref Rng 10/07/2015 09/23/2015 09/16/2015  WBC 4.0 - 10.3 10e3/uL 3.9(L) 5.3 5.9  Hemoglobin 13.0 - 17.1 g/dL 14.4 13.2 13.7  Hematocrit 38.4 - 49.9 % 42.8 37.2(L) 38.0(L)  Platelets 140 - 400 10e3/uL 244 373 304   CMP Latest Ref Rng 09/23/2015 09/16/2015 09/09/2015  Glucose 70 - 140 mg/dl 106 108 122(H)  BUN 7.0 - 26.0 mg/dL 13.6 8.4 7  Creatinine 0.7 - 1.3 mg/dL 0.7 0.7 0.76  Sodium 136 - 145 mEq/L 133(L) 131(L) 130(L)  Potassium 3.5 - 5.1 mEq/L 3.8 3.8 3.8  Chloride 101 - 111 mmol/L - - 97(L)  CO2 22 - 29 mEq/L _0 Calcium 8.4 - 10.4 mg/dL 9.3 9.1 8.2(L)  Total Protein 6.4 - 8.3 g/dL 6.2(L) 6.3(L) 5.0(L)  Total Bilirubin 0.20 - 1.20 mg/dL 0.56 0.64 0.6  Alkaline Phos 40 - 150 U/L 109 109 74  AST 5 - 34 U/L 19 28 41  ALT 0 - 55 U/L _1 RADIOGRAPHIC STUDIES: No new studies since last visit  ASSESSMENT AND PLAN: This is a very pleasant 74 years old white male with:  1)  Stage IV non-small cell lung cancer, adenocarcinoma status post induction chemotherapy with carboplatin and Alimta and currently on maintenance treatment with single agent Alimta status post 8 cycles. He has been observation for the last few months.  He had evidence for disease progression on the left adrenal gland and the patient underwent palliative radiotherapy to the left adrenal gland under the care of Dr. Sondra Come completed on 12/16/2014. He is currently undergoing treatment with immunotherapy with Nivolumab status post 14 cycles and tolerating his treatment fairly well. The patient had cholecystectomy on10/4 for which cycle #14 was delayed by 1 week, receiving treatment on 10/19, to give him more time for recovery after the surgery. Will continue with cycle 15 today He will  return on 11/16 for his next cycle  2) acute cholecystitis: Status post cholecystectomy on 09/08/2015 under the care of Dr. Dalbert Batman. He is recovering well  3) Atrial fibrillation: He'll continue his current medication with Cardizem, digoxin and metoprolol in addition to Eliquis. He'll also continue his routine followup visit with his cardiologist Dr. [name] next week.  4) history of Hyponatremia: demeclocycline was  discontinued on 10/12 and for now will continue some salt tablets.  We will continue to monitor his lab work closely. He would come back for follow-up visit on 11/16 for reevaluation before starting cycle #16. He was advised to call immediately if he has any concerning symptoms in the interval.  5. History of vertigo THe patient has known Vertigo for the last 9 months, and is to be seen by his primary care physician regarding his issue.  He will update as of findings, during the next appointment at the cancer center The patient voices understanding of current disease status and treatment options and is in agreement with the current care plan.  All questions were answered. The patient knows to call the clinic with any problems, questions or concerns. We can certainly see the patient much sooner if necessary.

## 2015-10-07 NOTE — Patient Instructions (Signed)
[facility] Discharge Instructions for Patients Receiving Chemotherapy  Today you received the following chemotherapy agents Nivolumab.  To help prevent nausea and vomiting after your treatment, we encourage you to take your nausea medication as prescribed.   If you develop nausea and vomiting that is not controlled by your nausea medication, call the clinic.   BELOW ARE SYMPTOMS THAT SHOULD BE REPORTED IMMEDIATELY:  *FEVER GREATER THAN 100.5 F  *CHILLS WITH OR WITHOUT FEVER  NAUSEA AND VOMITING THAT IS NOT CONTROLLED WITH YOUR NAUSEA MEDICATION  *UNUSUAL SHORTNESS OF BREATH  *UNUSUAL BRUISING OR BLEEDING  TENDERNESS IN MOUTH AND THROAT WITH OR WITHOUT PRESENCE OF ULCERS  *URINARY PROBLEMS  *BOWEL PROBLEMS  UNUSUAL RASH Items with * indicate a potential emergency and should be followed up as soon as possible.  Feel free to call the clinic you have any questions or concerns. The clinic phone number is [phone].  Please show the CHEMO ALERT CARD at check-in to the Emergency Department and triage nurse.   

## 2015-10-07 NOTE — Telephone Encounter (Signed)
Gave and pirnted appt sched and avs for pt for NOV thru [place]

## 2015-10-09 DIAGNOSIS — R42 Dizziness and giddiness: Secondary | ICD-10-CM | POA: Diagnosis not present

## 2015-10-09 DIAGNOSIS — Z23 Encounter for immunization: Secondary | ICD-10-CM | POA: Diagnosis not present

## 2015-10-09 DIAGNOSIS — Z1389 Encounter for screening for other disorder: Secondary | ICD-10-CM | POA: Diagnosis not present

## 2015-10-15 ENCOUNTER — Ambulatory Visit (INDEPENDENT_AMBULATORY_CARE_PROVIDER_SITE_OTHER): Payer: Medicare Other | Admitting: Neurology

## 2015-10-15 ENCOUNTER — Encounter: Payer: Self-pay | Admitting: Neurology

## 2015-10-15 VITALS — BP 136/73 | HR 107 | Ht 72.0 in | Wt 154.8 lb

## 2015-10-15 DIAGNOSIS — R269 Unspecified abnormalities of gait and mobility: Secondary | ICD-10-CM | POA: Diagnosis not present

## 2015-10-15 DIAGNOSIS — R2689 Other abnormalities of gait and mobility: Secondary | ICD-10-CM | POA: Diagnosis not present

## 2015-10-15 DIAGNOSIS — G1229 Other motor neuron disease: Secondary | ICD-10-CM

## 2015-10-15 DIAGNOSIS — G620 Drug-induced polyneuropathy: Secondary | ICD-10-CM | POA: Insufficient documentation

## 2015-10-15 DIAGNOSIS — Z79899 Other long term (current) drug therapy: Secondary | ICD-10-CM | POA: Diagnosis not present

## 2015-10-15 DIAGNOSIS — M25879 Other specified joint disorders, unspecified ankle and foot: Secondary | ICD-10-CM | POA: Diagnosis not present

## 2015-10-15 DIAGNOSIS — G629 Polyneuropathy, unspecified: Secondary | ICD-10-CM

## 2015-10-15 DIAGNOSIS — G1221 Amyotrophic lateral sclerosis: Secondary | ICD-10-CM | POA: Diagnosis not present

## 2015-10-15 DIAGNOSIS — R270 Ataxia, unspecified: Secondary | ICD-10-CM | POA: Diagnosis not present

## 2015-10-15 DIAGNOSIS — T451X5A Adverse effect of antineoplastic and immunosuppressive drugs, initial encounter: Secondary | ICD-10-CM

## 2015-10-15 DIAGNOSIS — R42 Dizziness and giddiness: Secondary | ICD-10-CM | POA: Diagnosis not present

## 2015-10-15 MED ORDER — DULOXETINE HCL 30 MG PO CPEP: 30.0000 mg | ORAL_CAPSULE | Freq: Every day | ORAL | Status: DC

## 2015-10-15 NOTE — Progress Notes (Signed)
[facility]    Provider:  Dr Jaynee Eagles Referring Provider: [name], MD Primary Care Physician:  [name], MD  CC:  disequilibrium  HPI:  Anthony Curry is a 74 y.o. male here as a referral from Dr. Alyson Ingles for balance difficulty, feeling of dizziness, proprioception difficulties. 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 Sondra Come completed on 12/16/2014, currently on Nivolumab. [name] He is here with daiughter and wife who provide information. He feels drunk all the time. Started 8-9 months ago. Started with chemotherapy and noticed when started Opdivo. He had a cardiologic workup for his symptoms which did not reveal the etiology. He went to his personal physician as well for a workup. He was referred here. He feels dizzy. If he takes a shower, he has to sit down because he can't close his eyes while standing or her falls. When he is sitting down he is ok. Symptoms occur only when walking or on his feet and he has to sit down for the sensations to stop. It makes him sleepy as well. Not getting worse but not getting better. He feel like he is going to fall when he is on his feet or standing. Worried he is going to fall and embarrass his wife. Feels like hike he is on a boat, a "waviness". He hasn't fallen, he uses a walker, and he uses a cane. If he closes his eyes he better sit down or he will fall. No numbness or tingling in the feet or fingers. 4-5 months ago he performed an mri of the brain and was told it was ok. He was told there is nothing wrong with the brain. No other focal neurologic complaints. Denies room spinning, light headedness, weakness, dysarthria,  dysphagia or other neurolgic deficits.   Reviewed notes, labs and imaging from outside physicians, which showed:  MRi of the brain 05/2015:  Personally reviewed images and agree with following: 1. No evidence of intracranial metastatic disease. No explanation for recent onset dizziness. 2. 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   Review of Systems: Patient complains of symptoms per HPI as well as the following symptoms: spinning sensation, urination problems, dizziness, sleepiness. Pertinent negatives per HPI. All others negative.   Social History   Social History  . Marital Status: Married    Spouse Name: Danyel Griess  . Number of Children: 3  . Years of Education: 12   Occupational History  . 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 Smoking status: Former Smoker -- 2.00 packs/day for 30 years    Types: Cigarettes    Quit date: 12/05/1990  . Smokeless tobacco: Never Used  . Alcohol Use: 0.0 oz/week    0 Standard drinks or equivalent per week     Comment: past hx.ETOH abuse,08-31-15 "occ. wine ,drinks non alcoholic beer occ."  . Drug Use: No  . Sexual Activity: Not Currently   Other Topics Concern  . Not on file   Social History Narrative   Lives at home with wife.   Caffeine use: Drinks coffee- 2 cups per day   Retired from [employer] History  Problem Relation Age of  Onset  . Brain cancer Brother   . Lung cancer Mother   . Hypertension Father   . Heart attack Father   . Diabetes Paternal Grandmother   . Heart attack Brother   . Neuropathy Neg Hx     Past Medical History  Diagnosis Date  . Hypertension   . GERD (gastroesophageal reflux disease)   . Alcohol abuse   . 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 Low sodium   . Persistent atrial fibrillation ([place]) 04/11/14    chads2vasc score is 2  . Hypotension   . Atrial fibrillation with RVR ([place])     Dr. Allred,cardiology follows LOV 07-30-15  . HCAP  (healthcare-associated pneumonia)   . 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 BPH (benign prostatic hyperplasia)   . Hyponatremia   . Anemia of chronic disease   . Fall   . Alcohol intoxication ([place])   . Pulmonary nodules   . Adrenal mass ([place])   . Atrial flutter by electrocardiogram ([place])   . 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 Alcoholism in remission ([place])   . Radiation 12/02/14-12/16/14    Left adrenal gland 30 Gy in 10 fractions  . Dysrhythmia     hx. atrial fibrillation-hx cardiversion- no recent issues- sees Dr. [name]  . Complication of anesthesia 09/08/2015     JITTERY AFTER GALLBLADDER SURGERY  . Anxiety   . Cancer ([place])     small cell/lung  . Neoplastic malignant related fatigue     IV tx. every 2 weeks last9-14-16 (Dr. Earlie Server), radiation last tx. 6 months    Past Surgical History  Procedure Laterality Date  . Hemorrhoid surgery    . Ankle arthrodesis  1995    rt fx-hardware in  . Tonsillectomy    . Colonoscopy    . Excision / curettage bone cyst finger  2005    rt thumb  . Total shoulder arthroplasty  02/03/2012    Procedure: TOTAL SHOULDER ARTHROPLASTY;  Surgeon: [name], MD;  Location: [facility];  Service: Orthopedics;  Laterality: Left;  . Cardioversion N/A 09/29/2014    Procedure: CARDIOVERSION;  Surgeon: Josue Hector, MD;  Location: [facility] ENDOSCOPY;  Service: Cardiovascular;  Laterality: N/A;  . Percutaneous cholecytostomy tube      IVR insertion 06-13-15(Dr. Vernard Gambles)- 08-28-15 remains in place to drainage bag  . Ercp N/A 09/02/2015    Procedure: ENDOSCOPIC RETROGRADE CHOLANGIOPANCREATOGRAPHY (ERCP);  Surgeon: Arta Silence, MD;  Location: [facility] ENDOSCOPY;  Service: Endoscopy;  Laterality: N/A;  . Ercp N/A 09/07/2015    Procedure: ENDOSCOPIC RETROGRADE CHOLANGIOPANCREATOGRAPHY (ERCP);  Surgeon: Clarene Essex, MD;  Location: [facility] ENDOSCOPY;  Service: Endoscopy;  Laterality: N/A;  . Cholecystectomy  09/08/2015     laproscopic   .  Cholecystectomy N/A 09/08/2015    Procedure: LAPAROSCOPIC CHOLECYSTECTOMY WITH ATTEMPTED INTRAOPERATIVE CHOLANGIOGRAM ;  Surgeon: Fanny Skates, MD;  Location: [facility];  Service: General;  Laterality: N/A;    Current Outpatient Prescriptions  Medication Sig Dispense Refill  . ALPRAZolam (XANAX) 0.25 MG tablet Take 0.25 mg by mouth 3 (three) times daily as needed for anxiety or sleep. And also at night for sleep.    [name] apixaban (ELIQUIS) 5 MG TABS tablet Take 5 mg by mouth 2 (two) times daily.    . Ascorbic Acid (VITAMIN C PO) Take 1,000 mg by mouth daily.     . B Complex-C (SUPER B COMPLEX PO) Take 1 each by mouth daily.     . calcium carbonate (OS-CAL) 600  MG TABS Take 600 mg by mouth daily with breakfast.    . demeclocycline (DECLOMYCIN) 150 MG tablet Take 150 mg by mouth 2 (two) times daily.    [name] diltiazem (CARDIZEM CD) 180 MG 24 hr capsule Take 1 capsule (180 mg total) by mouth daily at 12 noon. 90 capsule 3  . furosemide (LASIX) 40 MG tablet Take 0.5 tablets (20 mg total) by mouth daily. (Patient taking differently: Take 20 mg by mouth every other day. ) 90 tablet 3  . HYDROcodone-acetaminophen (NORCO) 5-325 MG tablet Take 1-2 tablets by mouth every 6 (six) hours as needed for moderate pain or severe pain. 30 tablet 0  . HYDROcodone-acetaminophen (NORCO/VICODIN) 5-325 MG per tablet Take 1-2 tablets by mouth every 4 (four) hours as needed for moderate pain. 30 tablet 0  . KLOR-CON M20 20 MEQ tablet Take 20 mEq by mouth every morning.     . Lactobacillus (ACIDOPHILUS) CAPS capsule Take 1 capsule by mouth daily.    [name] MAGNESIUM PO Take 400 mg by mouth daily.     . mirtazapine (REMERON) 15 MG tablet Take 1 tablet (15 mg total) by mouth at bedtime. (Patient taking differently: Take 15 mg by mouth at bedtime as needed (sleep). ) 30 tablet 0  . Multiple Vitamin (MULTIVITAMIN WITH MINERALS) TABS Take 1 tablet by mouth daily.    . Nivolumab (OPDIVO IV) Inject as directed every 2 weeks    . Nutritional  Supplements (ENSURE COMPLETE PO) Take 1 Can by mouth 2 (two) times daily.    . Omega-3 Fatty Acids (FISH OIL PO) Take 1 tablet by mouth daily.    . pantoprazole (PROTONIX) 40 MG tablet Take 40 mg by mouth every morning.  8  . tamsulosin (FLOMAX) 0.4 MG CAPS capsule Take 0.4 mg by mouth daily at 12 noon.     . thiamine (VITAMIN B-1) 100 MG tablet Take 1 tablet (100 mg total) by mouth daily. 30 tablet 6  . vitamin E 400 UNIT capsule Take 400 Units by mouth daily.     No current facility-administered medications for this visit.    Allergies as of 10/15/2015  . (No Known Allergies)    Vitals: BP 136/73 mmHg  Pulse 107  Ht 6' (1.829 m)  Wt 154 lb 12.8 oz (70.217 kg)  BMI 20.99 kg/m2 Last Weight:  Wt Readings from Last 1 Encounters:  10/15/15 154 lb 12.8 oz (70.217 kg)   Last Height:   Ht Readings from Last 1 Encounters:  10/15/15 6' (1.829 m)   Physical exam: Exam: Gen: NAD, conversant, well nourised, well groomed                     CV: RRR, no MRG. No Carotid Bruits. No peripheral edema, warm, nontender Eyes: Conjunctivae clear without exudates or hemorrhage  Neuro: Detailed Neurologic Exam  Speech:    Speech is normal; fluent and spontaneous with normal comprehension.  Cognition:    The patient is oriented to person, place, and time;     recent and remote memory intact;     language fluent;     normal attention, concentration,     fund of knowledge Cranial Nerves:    The pupils are equal, round, and reactive to light. The fundi are flat. Visual fields are full to finger confrontation. Extraocular movements are intact. Right ptosis. Trigeminal sensation is intact and the muscles of mastication are normal. The face is symmetric. The palate elevates in the midline. Hearing intact.  Voice is normal. Shoulder shrug is normal. The tongue has normal motion without fasciculations.   Coordination:    Normal finger to nose and heel to shin. Normal rapid alternating movements.    Gait:    Wide-based. Difficulty with heel/toe/tandem. Sensory ataxia.  Motor Observation:    No asymmetry, no atrophy, and no involuntary movements noted. Tone:    Normal muscle tone.    Posture:    Posture is normal. normal erect    Strength:    Strength is V/V in the upper and lower limbs.  Sensation: Dec pp and temp in glove-stocking distribution Absent proprioception in the great toes Absent vibration at the great toes +Romberg     Reflex Exam:  DTR's:  Brisk patellars, absent AJs    Toes:    The toes are downgoing bilaterally.   Clonus:    Clonus is absent.       Assessment/Plan:   74 y.o. male here as a referral from Dr. Alyson Ingles for balance difficulty, feeling of dizziness, proprioception difficulties. 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 Sondra Come completed on 12/16/2014, currently on Nivolumab. His exam is significant for mixed (small and large) fiber neuropathy including absent proprioception and vibration at the distal lower extremities, +Romberg, decr pp and temp in a glove and stocking distribution.   -His significant large-fiber sensory loss and proprioceptive loss is likely the cause of his balance problems. +Romberg. There are many causes of distal polyneuropathy. Platin-based chemotherapy is a known cause of peripheral neurotoxicity.Nivolumab also has documented side effects of peripheral and sensory neuropathy however not sure how common this is in this medication. He has a history of alcohol abuse per notes which may also could be contributory as alcohol is a neuro toxin and a cause of sensorimotor nerve damage.   Had a long discussion with patient and  family, explaining polyneuropathy and proprioception. Symptoms may be chronic. Will order a serum workup for other causes of polyneuropathy. - given his brisk LE reflexes at the knees, will also order MR of the cervical and thoracic cords to assess for any upper motor neuron causes - Needs physical therapy for gait and balance, when he decides where he is going I would like to talk to the physical therapist - NCS/EMG to further examine polyneuropathy  Sarina Ill, MD  [facility] [address] [place], [place] [address]  Phone [phone] Fax [phone]

## 2015-10-15 NOTE — Patient Instructions (Addendum)
As far as diagnostic testing: Labs, MRI of spine, EMG/NCS, Physical Therapy  I would like to see you back for emg/ncs, sooner if we need to. Please call us with any interim questions, concerns, problems, updates or refill requests.   Please also call us for any test results so we can go over those with you on the phone.  My clinical assistant and will answer any of your questions and relay your messages to me and also relay most of my messages to you.   Our phone number is [phone]. We also have an after hours call service for urgent matters and there is a physician on-call for urgent questions. For any emergencies you know to call 911 or go to the nearest emergency room

## 2015-10-18 LAB — VITAMIN B6

## 2015-10-18 LAB — METHYLMALONIC ACID, SERUM: METHYLMALONIC ACID: 260 nmol/L (ref 0–378)

## 2015-10-18 LAB — B12 AND FOLATE PANEL

## 2015-10-18 LAB — VITAMIN B1: THIAMINE: 159.4 nmol/L (ref 66.5–200.0)

## 2015-10-18 LAB — TSH: TSH: 3.05 u[IU]/mL (ref 0.450–4.500)

## 2015-10-20 ENCOUNTER — Encounter: Payer: Self-pay | Admitting: Neurology

## 2015-10-20 ENCOUNTER — Telehealth: Payer: Self-pay | Admitting: Neurology

## 2015-10-20 ENCOUNTER — Telehealth: Payer: Self-pay | Admitting: *Deleted

## 2015-10-20 NOTE — Telephone Encounter (Signed)
Thanks. We will see what happens.

## 2015-10-20 NOTE — Telephone Encounter (Signed)
I called pt.  I relayed the information/results below.   He did not have his bottle of B vitamins to know how much B 6 he was getting.  He will check this.  He can take less than '100mg'$  daily as per note below and that would be ok with Dr. Jaynee Eagles.  Pt verbalized understanding.   If questions will call back and ask for Mickel Fuchs or Emma.

## 2015-10-20 NOTE — Telephone Encounter (Signed)
-----   Message from Melvenia Beam, MD sent at 10/20/2015  8:57 AM EST ----- Anthony Curry, patient's labs looked good. Thyroid, b12, folate, thiamine all look good. His B6 was high, taking too much B6 can actually be harmful. Cases of peripheral neuropathy, dermatoses, photosensitivity, dizziness, and nausea have been reported with long-term megadoses of pyridoxine over 250 mg/day; a few cases of neuropathy appear to have been caused by chronic intake of 100 to 200 mg/day. Not sure how much he is taking every day but as long as it is well below the above values I am ok with it, thanks

## 2015-10-20 NOTE — Telephone Encounter (Signed)
Pt called and requesting Lovey Newcomer to return the call

## 2015-10-20 NOTE — Telephone Encounter (Signed)
Please call patient and find out where he is going for physical therapy.

## 2015-10-20 NOTE — Telephone Encounter (Signed)
I called pt.   He is taking '3mg'$  of B6 in his MVI, and '6mg'$  B6 in his B complex.  I told him that is well below the '100mg'$  daily.  He will continue to take both of these.  He has ataxia, dizziness, lightheaded which are sx that he come in for.  Has MRI scheduled next week.

## 2015-10-21 ENCOUNTER — Ambulatory Visit (HOSPITAL_BASED_OUTPATIENT_CLINIC_OR_DEPARTMENT_OTHER): Payer: Medicare Other | Admitting: Internal Medicine

## 2015-10-21 ENCOUNTER — Encounter: Payer: Self-pay | Admitting: Internal Medicine

## 2015-10-21 ENCOUNTER — Ambulatory Visit (HOSPITAL_BASED_OUTPATIENT_CLINIC_OR_DEPARTMENT_OTHER): Payer: Medicare Other

## 2015-10-21 ENCOUNTER — Other Ambulatory Visit (HOSPITAL_BASED_OUTPATIENT_CLINIC_OR_DEPARTMENT_OTHER): Payer: Medicare Other

## 2015-10-21 VITALS — BP 153/80 | HR 94 | Temp 97.7°F | Resp 18 | Ht 72.0 in | Wt 153.9 lb

## 2015-10-21 DIAGNOSIS — Z5112 Encounter for antineoplastic immunotherapy: Secondary | ICD-10-CM

## 2015-10-21 DIAGNOSIS — C3490 Malignant neoplasm of unspecified part of unspecified bronchus or lung: Secondary | ICD-10-CM

## 2015-10-21 DIAGNOSIS — E871 Hypo-osmolality and hyponatremia: Secondary | ICD-10-CM

## 2015-10-21 DIAGNOSIS — I4891 Unspecified atrial fibrillation: Secondary | ICD-10-CM

## 2015-10-21 DIAGNOSIS — C3431 Malignant neoplasm of lower lobe, right bronchus or lung: Secondary | ICD-10-CM

## 2015-10-21 DIAGNOSIS — Z79899 Other long term (current) drug therapy: Secondary | ICD-10-CM

## 2015-10-21 DIAGNOSIS — C3411 Malignant neoplasm of upper lobe, right bronchus or lung: Secondary | ICD-10-CM

## 2015-10-21 DIAGNOSIS — G622 Polyneuropathy due to other toxic agents: Secondary | ICD-10-CM | POA: Diagnosis not present

## 2015-10-21 LAB — TSH CHCC: TSH: 2.848 m[IU]/L (ref 0.320–4.118)

## 2015-10-21 LAB — CBC WITH DIFFERENTIAL/PLATELET
BASO%: 0.4 % (ref 0.0–2.0)
BASOS ABS: 0 10*3/uL (ref 0.0–0.1)
EOS%: 6.3 % (ref 0.0–7.0)
Eosinophils Absolute: 0.4 10*3/uL (ref 0.0–0.5)
HEMATOCRIT: 40.3 % (ref 38.4–49.9)
HGB: 14.3 g/dL (ref 13.0–17.1)
LYMPH#: 1.3 10*3/uL (ref 0.9–3.3)
LYMPH%: 23.9 % (ref 14.0–49.0)
MCH: 32.1 pg (ref 27.2–33.4)
MCHC: 35.5 g/dL (ref 32.0–36.0)
MCV: 90.6 fL (ref 79.3–98.0)
MONO#: 0.5 10*3/uL (ref 0.1–0.9)
MONO%: 9.7 % (ref 0.0–14.0)
NEUT#: 3.3 10*3/uL (ref 1.5–6.5)
NEUT%: 59.7 % (ref 39.0–75.0)
Platelets: 243 10*3/uL (ref 140–400)
RBC: 4.45 10*6/uL (ref 4.20–5.82)
RDW: 14.3 % (ref 11.0–14.6)
WBC: 5.6 10*3/uL (ref 4.0–10.3)

## 2015-10-21 LAB — COMPREHENSIVE METABOLIC PANEL (CC13)
ALT: 18 U/L (ref 0–55)
ANION GAP: 11 meq/L (ref 3–11)
AST: 22 U/L (ref 5–34)
Albumin: 3.8 g/dL (ref 3.5–5.0)
Alkaline Phosphatase: 101 U/L (ref 40–150)
BUN: 15.5 mg/dL (ref 7.0–26.0)
CALCIUM: 9.3 mg/dL (ref 8.4–10.4)
CHLORIDE: 101 meq/L (ref 98–109)
CO2: 22 mEq/L (ref 22–29)
Creatinine: 0.8 mg/dL (ref 0.7–1.3)
EGFR: 87 mL/min/{1.73_m2} — AB (ref >90)
Glucose: 123 mg/dl (ref 70–140)
POTASSIUM: 3.9 meq/L (ref 3.5–5.1)
Sodium: 134 mEq/L — ABNORMAL LOW (ref 136–145)
Total Bilirubin: 0.65 mg/dL (ref 0.20–1.20)
Total Protein: 6.3 g/dL — ABNORMAL LOW (ref 6.4–8.3)

## 2015-10-21 MED ORDER — SODIUM CHLORIDE 0.9 % IV SOLN
Freq: Once | INTRAVENOUS | Status: AC
  Administered 2015-10-21: 13:00:00 via INTRAVENOUS

## 2015-10-21 MED ORDER — NIVOLUMAB CHEMO INJECTION 100 MG/10ML
240.0000 mg | Freq: Once | INTRAVENOUS | Status: AC
  Administered 2015-10-21: 240 mg via INTRAVENOUS
  Filled 2015-10-21: qty 24

## 2015-10-21 NOTE — Progress Notes (Signed)
[place] [phone]   Fax:([phone]) [place], [place] [address]  DIAGNOSIS: Stage IV (T1 A., N0, M1 B.) non-small cell lung cancer, adenocarcinoma with negative EGFR mutation and negative ALK gene translocation diagnosed in October of 2014.   Molecular profile: Negative for RET, ALK, BRAF, MET, EGFR.  Positive for ERBB2 A775T, [id number], KRAS G13E, MAP2K1 D67N (see full report).   PRIOR THERAPY:  1) Systemic chemotherapy with carboplatin for AUC of 5 and Alimta 500 mg/M2 every 3 weeks, status post 6 cycles with stable disease. First dose on 10/23/2013. 2) Maintenance systemic chemotherapy with single agent Alimta 500 mg/M2 every 3 weeks. Status post 8 cycles.   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 Sondra Come completed on 12/16/2014.  CURRENT THERAPY: Immunotherapy with Nivolumab 3 MG/KG every 2 weeks, status post 13 cycles. First cycle 01/28/2015  CHEMOTHERAPY INTENT: Palliative  CURRENT # OF CHEMOTHERAPY CYCLES: 13 CURRENT ANTIEMETICS: Zofran, Compazine and dexamethasone  CURRENT SMOKING STATUS: Former smoker  ORAL CHEMOTHERAPY AND CONSENT: None  CURRENT BISPHOSPHONATES USE: None  PAIN MANAGEMENT: 0/10  NARCOTICS INDUCED CONSTIPATION: None  LIVING WILL AND CODE STATUS: ?   INTERVAL HISTORY: Anthony Curry 74 y.o. male returns to the clinic today for followup visit accompanied by his wife.  The patient is feeling much better today except for the peripheral neuropathy. He was seen by a neurologist ad was found to have elevated B6 which could be one of the underlying causes for his peripheral neuropathy.  He denied having any significant chest pain, shortness breath, cough or hemoptysis. He denied having any nausea or vomiting. He recovered well from his recent surgery and he is here to resume his treatment with immunotherapy.    MEDICAL HISTORY: Past Medical History  Diagnosis Date  . Hypertension   . GERD (gastroesophageal reflux disease)   . Alcohol abuse   . 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 Low sodium   . Persistent atrial fibrillation ([place]) 04/11/14    chads2vasc score is 2  . Hypotension   . Atrial fibrillation with RVR ([place])     Dr. Allred,cardiology follows LOV 07-30-15  . HCAP (healthcare-associated pneumonia)   . 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 BPH (benign prostatic hyperplasia)   . Hyponatremia   . Anemia of chronic disease   . Fall   . Alcohol intoxication ([place])   . Pulmonary nodules   . Adrenal mass ([place])   . Atrial flutter by electrocardiogram ([place])   . 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 Alcoholism in remission ([place])   . Radiation 12/02/14-12/16/14    Left adrenal gland 30 Gy in 10 fractions  . Dysrhythmia     hx. atrial fibrillation-hx cardiversion- no recent issues- sees Dr. [name]  . Complication of anesthesia 09/08/2015     JITTERY AFTER GALLBLADDER SURGERY  . Anxiety   . Cancer ([place])     small cell/lung  . Neoplastic malignant related fatigue     IV tx. every 2 weeks last9-14-16 (Dr. Earlie Server), radiation last tx. 6 months    ALLERGIES:  has No Known Allergies.  MEDICATIONS:  Current Outpatient Prescriptions  Medication Sig Dispense Refill  . ALPRAZolam (XANAX) 0.25 MG tablet Take 0.25 mg by mouth 3 (three) times daily as needed for anxiety or sleep. And also at night for sleep.    [name] apixaban (ELIQUIS) 5  MG TABS tablet Take 5 mg by mouth 2 (two) times daily.    . Ascorbic Acid (VITAMIN C PO) Take 1,000 mg by mouth daily.     . B Complex-C (SUPER B COMPLEX PO) Take 1 each by mouth daily.     . calcium carbonate (OS-CAL) 600 MG TABS Take 600 mg by mouth daily with breakfast.    . demeclocycline (DECLOMYCIN) 150 MG tablet Take 150 mg by mouth 2 (two) times daily.    [name] diltiazem (CARDIZEM CD) 180 MG 24 hr capsule Take 1 capsule (180 mg total) by mouth  daily at 12 noon. 90 capsule 3  . furosemide (LASIX) 40 MG tablet Take 0.5 tablets (20 mg total) by mouth daily. (Patient taking differently: Take 20 mg by mouth every other day. ) 90 tablet 3  . HYDROcodone-acetaminophen (NORCO) 5-325 MG tablet Take 1-2 tablets by mouth every 6 (six) hours as needed for moderate pain or severe pain. 30 tablet 0  . HYDROcodone-acetaminophen (NORCO/VICODIN) 5-325 MG per tablet Take 1-2 tablets by mouth every 4 (four) hours as needed for moderate pain. 30 tablet 0  . KLOR-CON M20 20 MEQ tablet Take 20 mEq by mouth every morning.     . Lactobacillus (ACIDOPHILUS) CAPS capsule Take 1 capsule by mouth daily.    [name] MAGNESIUM PO Take 400 mg by mouth daily.     . mirtazapine (REMERON) 15 MG tablet Take 1 tablet (15 mg total) by mouth at bedtime. (Patient taking differently: Take 15 mg by mouth at bedtime as needed (sleep). ) 30 tablet 0  . Multiple Vitamin (MULTIVITAMIN WITH MINERALS) TABS Take 1 tablet by mouth daily.    . Nivolumab (OPDIVO IV) Inject as directed every 2 weeks    . Nutritional Supplements (ENSURE COMPLETE PO) Take 1 Can by mouth 2 (two) times daily.    . Omega-3 Fatty Acids (FISH OIL PO) Take 1 tablet by mouth daily.    . pantoprazole (PROTONIX) 40 MG tablet Take 40 mg by mouth every morning.  8  . tamsulosin (FLOMAX) 0.4 MG CAPS capsule Take 0.4 mg by mouth daily at 12 noon.     . thiamine (VITAMIN B-1) 100 MG tablet Take 1 tablet (100 mg total) by mouth daily. 30 tablet 6  . vitamin E 400 UNIT capsule Take 400 Units by mouth daily.     No current facility-administered medications for this visit.    SURGICAL HISTORY:  Past Surgical History  Procedure Laterality Date  . Hemorrhoid surgery    . Ankle arthrodesis  1995    rt fx-hardware in  . Tonsillectomy    . Colonoscopy    . Excision / curettage bone cyst finger  2005    rt thumb  . Total shoulder arthroplasty  02/03/2012    Procedure: TOTAL SHOULDER ARTHROPLASTY;  Surgeon: [name], MD;   Location: [facility];  Service: Orthopedics;  Laterality: Left;  . Cardioversion N/A 09/29/2014    Procedure: CARDIOVERSION;  Surgeon: Josue Hector, MD;  Location: [facility] ENDOSCOPY;  Service: Cardiovascular;  Laterality: N/A;  . Percutaneous cholecytostomy tube      IVR insertion 06-13-15(Dr. Vernard Gambles)- 08-28-15 remains in place to drainage bag  . Ercp N/A 09/02/2015    Procedure: ENDOSCOPIC RETROGRADE CHOLANGIOPANCREATOGRAPHY (ERCP);  Surgeon: Arta Silence, MD;  Location: [facility] ENDOS[facility];  Service: Endoscopy;  Laterality: N/A;  . Ercp N/A 09/07/2015    Procedure: ENDOSCOPIC RETROGRADE CHOLANGIOPANCREATOGRAPHY (ERCP);  Surgeon: Clarene Essex, MD;  Location: [facility] ENDOSCOPY;  Service: Endoscopy;  Laterality: N/A;  . Cholecystectomy  09/08/2015     laproscopic   . Cholecystectomy N/A 09/08/2015    Procedure: LAPAROSCOPIC CHOLECYSTECTOMY WITH ATTEMPTED INTRAOPERATIVE CHOLANGIOGRAM ;  Surgeon: Fanny Skates, MD;  Location: [facility];  Service: General;  Laterality: N/A;    REVIEW OF SYSTEMS:  A comprehensive review of systems was negative except for: Neurological: positive for paresthesia   PHYSICAL EXAMINATION: General appearance: alert, cooperative, fatigued and no distress Head: Normocephalic, without obvious abnormality, atraumatic Neck: no adenopathy, no JVD, supple, symmetrical, trachea midline and thyroid not enlarged, symmetric, no tenderness/mass/nodules Lymph nodes: Cervical, supraclavicular, and axillary nodes normal. Resp: clear to auscultation bilaterally Back: symmetric, no curvature. ROM normal. No CVA tenderness. Cardio: regular rate and rhythm, S1, S2 normal, no murmur, click, rub or gallop GI: Right upper quadrant tenderness to palpation Extremities: extremities normal, atraumatic, no cyanosis or edema Neurologic: Alert and oriented X 3, normal strength and tone. Normal symmetric reflexes. Normal coordination and gait  ECOG PERFORMANCE STATUS: 1 - Symptomatic but completely  ambulatory  Blood pressure 153/80, pulse 94, temperature 97.7 F (36.5 C), temperature source Oral, resp. rate 18, height 6' (1.829 m), weight 153 lb 14.4 oz (69.809 kg), SpO2 98 %.  LABORATORY DATA: Lab Results  Component Value Date   WBC 5.6 10/21/2015   HGB 14.3 10/21/2015   HCT 40.3 10/21/2015   MCV 90.6 10/21/2015   PLT 243 10/21/2015      Chemistry      Component Value Date/Time   NA 134* 10/21/2015 1111   NA 130* 09/09/2015 0530   K 3.9 10/21/2015 1111   K 3.8 09/09/2015 0530   CL 97* 09/09/2015 0530   CO2 22 10/21/2015 1111   CO2 26 09/09/2015 0530   BUN 15.5 10/21/2015 1111   BUN 7 09/09/2015 0530   CREATININE 0.8 10/21/2015 1111   CREATININE 0.76 09/09/2015 0530      Component Value Date/Time   CALCIUM 9.3 10/21/2015 1111   CALCIUM 8.2* 09/09/2015 0530   ALKPHOS 101 10/21/2015 1111   ALKPHOS 74 09/09/2015 0530   AST 22 10/21/2015 1111   AST 41 09/09/2015 0530   ALT 18 10/21/2015 1111   ALT 23 09/09/2015 0530   BILITOT 0.65 10/21/2015 1111   BILITOT 0.6 09/09/2015 0530       RADIOGRAPHIC STUDIES: No results found. ASSESSMENT AND PLAN: This is a very pleasant 74 years old white male with:  1)  Stage IV non-small cell lung cancer, adenocarcinoma status post induction chemotherapy with carboplatin and Alimta and currently on maintenance treatment with single agent Alimta status post 8 cycles. He has been observation for the last few months.  He had evidence for disease progression on the left adrenal gland and the patient underwent palliative radiotherapy to the left adrenal gland under the care of Dr. Sondra Come completed on 12/16/2014. He is currently undergoing treatment with immunotherapy with Nivolumab status post 13 cycles and tolerating his treatment fairly well.  he recovered well from the recent surgery. I recommended for the patient to resume his treatment with Nivolumab. He will start cycle #14 today. He would come back for follow-up visit in 2 weeks  for reevaluation with the start of cycle #15. 2) Atrial fibrillation: He'll continue his current medication with Cardizem, digoxin and metoprolol in addition to Eliquis. He'll also continue his routine followup visit with his cardiologist Dr. [name].  3) history of hyponatremia: He will discontinue his treatment with demeclocycline for now and try some salt  tablets. We will continue to monitor his lab work closely.  He was advised to call immediately if he has any concerning symptoms in the interval. The patient voices understanding of current disease status and treatment options and is in agreement with the current care plan.  All questions were answered. The patient knows to call the clinic with any problems, questions or concerns. We can certainly see the patient much sooner if necessary.  Disclaimer: This note was dictated with voice recognition software. Similar sounding words can inadvertently be transcribed and may not be corrected upon review.

## 2015-10-21 NOTE — Patient Instructions (Signed)
[facility] Discharge Instructions for Patients Receiving Chemotherapy  Today you received the following chemotherapy agents Nivolumab.  To help prevent nausea and vomiting after your treatment, we encourage you to take your nausea medication as prescribed.   If you develop nausea and vomiting that is not controlled by your nausea medication, call the clinic.   BELOW ARE SYMPTOMS THAT SHOULD BE REPORTED IMMEDIATELY:  *FEVER GREATER THAN 100.5 F  *CHILLS WITH OR WITHOUT FEVER  NAUSEA AND VOMITING THAT IS NOT CONTROLLED WITH YOUR NAUSEA MEDICATION  *UNUSUAL SHORTNESS OF BREATH  *UNUSUAL BRUISING OR BLEEDING  TENDERNESS IN MOUTH AND THROAT WITH OR WITHOUT PRESENCE OF ULCERS  *URINARY PROBLEMS  *BOWEL PROBLEMS  UNUSUAL RASH Items with * indicate a potential emergency and should be followed up as soon as possible.  Feel free to call the clinic you have any questions or concerns. The clinic phone number is [phone].  Please show the CHEMO ALERT CARD at check-in to the Emergency Department and triage nurse.   

## 2015-10-22 ENCOUNTER — Ambulatory Visit (HOSPITAL_COMMUNITY)
Admission: RE | Admit: 2015-10-22 | Discharge: 2015-10-22 | Disposition: A | Discharge status: Home / Self Care | Payer: Medicare Other | Source: Ambulatory Visit | Attending: Nurse Practitioner | Admitting: Nurse Practitioner

## 2015-10-22 ENCOUNTER — Encounter (HOSPITAL_COMMUNITY): Payer: Self-pay | Admitting: Nurse Practitioner

## 2015-10-22 VITALS — BP 116/72 | HR 89 | Ht 71.0 in | Wt 154.0 lb

## 2015-10-22 DIAGNOSIS — I480 Paroxysmal atrial fibrillation: Secondary | ICD-10-CM | POA: Insufficient documentation

## 2015-10-22 DIAGNOSIS — E279 Disorder of adrenal gland, unspecified: Secondary | ICD-10-CM | POA: Insufficient documentation

## 2015-10-22 DIAGNOSIS — K829 Disease of gallbladder, unspecified: Secondary | ICD-10-CM | POA: Diagnosis not present

## 2015-10-22 DIAGNOSIS — I119 Hypertensive heart disease without heart failure: Secondary | ICD-10-CM | POA: Diagnosis not present

## 2015-10-22 DIAGNOSIS — C3490 Malignant neoplasm of unspecified part of unspecified bronchus or lung: Secondary | ICD-10-CM | POA: Diagnosis not present

## 2015-10-22 NOTE — Progress Notes (Signed)
Patient ID: Anthony Curry, male   DOB: 1941-08-12, 74 y.o.   MRN: [id number]       PCP:  [name], MD  Electrophysiologist: Dr. [name]  The patient presents today for follow up in the afib clinic.  He was diagnosed with atrial fibrillation in May in the setting of treatment for his Stage IV lung CA. Had hospitalization 8/15 for AF with RVR. He had successful cardioversion 09/29/14. He continues with SR on diltiazem and metoprolol.   He has finished chemo/radiation for lung CA ans was treated  for adrenal mass. He was recently hospitalized for gallstone removal/choescytectomy and did well with this without any issues with bleeding or afib.  He is unaware of any Afib and ekg shows nsr.     He is otherwise doing reasonably well.  Today, he denies symptoms of palpitations, chest pain, some  lower extremity edema, dizziness, presyncope, syncope, or neurologic sequela.  The patient feels that he is tolerating medications without difficulties and is otherwise without complaint today.   Past Medical History  Diagnosis Date  . Hypertension   . GERD (gastroesophageal reflux disease)   . Alcohol abuse   . 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 Low sodium   . Persistent atrial fibrillation ([place]) 04/11/14    chads2vasc score is 2  . Hypotension   . Atrial fibrillation with RVR ([place])     Dr. Allred,cardiology follows LOV 07-30-15  . HCAP (healthcare-associated pneumonia)   . 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 BPH (benign prostatic hyperplasia)   . Hyponatremia   . Anemia of chronic disease   . Fall   . Alcohol intoxication ([place])   . Pulmonary nodules   . Adrenal mass ([place])   . Atrial flutter by electrocardiogram ([place])   . 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 Alcoholism in remission ([place])   . Radiation 12/02/14-12/16/14    Left adrenal gland 30 Gy in 10 fractions  . Dysrhythmia     hx. atrial fibrillation-hx cardiversion- no recent issues- sees Dr. [name]  .  Complication of anesthesia 09/08/2015     JITTERY AFTER GALLBLADDER SURGERY  . Anxiety   . Cancer ([place])     small cell/lung  . Neoplastic malignant related fatigue     IV tx. every 2 weeks last9-14-16 (Dr. Earlie Server), radiation last tx. 6 months   Past Surgical History  Procedure Laterality Date  . Hemorrhoid surgery    . Ankle arthrodesis  1995    rt fx-hardware in  . Tonsillectomy    . Colonoscopy    . Excision / curettage bone cyst finger  2005    rt thumb  . Total shoulder arthroplasty  02/03/2012    Procedure: TOTAL SHOULDER ARTHROPLASTY;  Surgeon: [name], MD;  Location: [place];  Service: Orthopedics;  Laterality: Left;  . Cardioversion N/A 09/29/2014    Procedure: CARDIOVERSION;  Surgeon: Josue Hector, MD;  Location: [facility] ENDOSCOPY;  Service: Cardiovascular;  Laterality: N/A;  . Percutaneous cholecytostomy tube      IVR insertion 06-13-15(Dr. Vernard Gambles)- 08-28-15 remains in place to drainage bag  . Ercp N/A 09/02/2015    Procedure: ENDOSCOPIC RETROGRADE CHOLANGIOPANCREATOGRAPHY (ERCP);  Surgeon: Arta Silence, MD;  Location: [facility] ENDOS[facility];  Service: Endoscopy;  Laterality: N/A;  . Ercp N/A 09/07/2015    Procedure: ENDOSCOPIC RETROGRADE CHOLANGIOPANCREATOGRAPHY (ERCP);  Surgeon: Clarene Essex, MD;  Location: [facility];  Service: Endoscopy;  Laterality: N/A;  . Cholecystectomy  09/08/2015     laproscopic   .  Cholecystectomy N/A 09/08/2015    Procedure: LAPAROSCOPIC CHOLECYSTECTOMY WITH ATTEMPTED INTRAOPERATIVE CHOLANGIOGRAM ;  Surgeon: Fanny Skates, MD;  Location: [place];  Service: General;  Laterality: N/A;    Current Outpatient Prescriptions  Medication Sig Dispense Refill  . ALPRAZolam (XANAX) 0.25 MG tablet Take 0.25 mg by mouth 3 (three) times daily as needed for anxiety or sleep. And also at night for sleep.    [name] apixaban (ELIQUIS) 5 MG TABS tablet Take 5 mg by mouth 2 (two) times daily.    . Ascorbic Acid (VITAMIN C PO) Take 1,000 mg by mouth daily.      . B Complex-C (SUPER B COMPLEX PO) Take 1 each by mouth daily.     . calcium carbonate (OS-CAL) 600 MG TABS Take 600 mg by mouth daily with breakfast.    . demeclocycline (DECLOMYCIN) 150 MG tablet Take 150 mg by mouth 2 (two) times daily.    [name] diltiazem (CARDIZEM CD) 180 MG 24 hr capsule Take 1 capsule (180 mg total) by mouth daily at 12 noon. 90 capsule 3  . furosemide (LASIX) 40 MG tablet Take 0.5 tablets (20 mg total) by mouth daily. (Patient taking differently: Take 20 mg by mouth every other day. ) 90 tablet 3  . HYDROcodone-acetaminophen (NORCO) 5-325 MG tablet Take 1-2 tablets by mouth every 6 (six) hours as needed for moderate pain or severe pain. 30 tablet 0  . HYDROcodone-acetaminophen (NORCO/VICODIN) 5-325 MG per tablet Take 1-2 tablets by mouth every 4 (four) hours as needed for moderate pain. 30 tablet 0  . KLOR-CON M20 20 MEQ tablet Take 20 mEq by mouth every morning.     . Lactobacillus (ACIDOPHILUS) CAPS capsule Take 1 capsule by mouth daily.    [name] MAGNESIUM PO Take 400 mg by mouth daily.     . mirtazapine (REMERON) 15 MG tablet Take 1 tablet (15 mg total) by mouth at bedtime. (Patient taking differently: Take 15 mg by mouth at bedtime as needed (sleep). ) 30 tablet 0  . Multiple Vitamin (MULTIVITAMIN WITH MINERALS) TABS Take 1 tablet by mouth daily.    . Nivolumab (OPDIVO IV) Inject as directed every 2 weeks    . Nutritional Supplements (ENSURE COMPLETE PO) Take 1 Can by mouth 2 (two) times daily.    . Omega-3 Fatty Acids (FISH OIL PO) Take 1 tablet by mouth daily.    . pantoprazole (PROTONIX) 40 MG tablet Take 40 mg by mouth every morning.  8  . tamsulosin (FLOMAX) 0.4 MG CAPS capsule Take 0.4 mg by mouth daily at 12 noon.     . thiamine (VITAMIN B-1) 100 MG tablet Take 1 tablet (100 mg total) by mouth daily. 30 tablet 6  . vitamin E 400 UNIT capsule Take 400 Units by mouth daily.     No current facility-administered medications for this encounter.    No Known  Allergies  Social History   Social History  . Marital Status: Married    Spouse Name: Xane Amsden  . Number of Children: 3  . Years of Education: 12   Occupational History  . 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 Smoking status: Former Smoker -- 2.00 packs/day for 30 years    Types: Cigarettes    Quit date: 12/05/1990  . Smokeless tobacco: Never Used  . Alcohol Use: 0.0 oz/week    0 Standard drinks or equivalent per week     Comment: past hx.ETOH abuse,08-31-15 "occ.  wine ,drinks non alcoholic beer occ."  . Drug Use: No  . Sexual Activity: Not Currently   Other Topics Concern  . Not on file   Social History Narrative   Lives at home with wife.   Caffeine use: Drinks coffee- 2 cups per day   Retired from [employer] History  Problem Relation Age of Onset  . Brain cancer Brother   . Lung cancer Mother   . Hypertension Father   . Heart attack Father   . Diabetes Paternal Grandmother   . Heart attack Brother   . Neuropathy Neg Hx     ROS-  All systems are reviewed and are negative except as outlined in the HPI above  Physical Exam: Filed Vitals:   10/22/15 1125  BP: 116/72  Pulse: 89  Height: '5\' 11"'$  (1.803 m)  Weight: 154 lb (69.854 kg)    GEN- The patient is ill appearing, alert and oriented x 3 today.   Head- normocephalic, atraumatic Eyes-  Sclera clear, conjunctiva pink Ears- hearing intact Oropharynx- clear Neck- supple,   Lungs- decreased BS at the bases, normal work of breathing Heart- RRR GI- soft, NT, ND, + BS Extremities- no clubbing, cyanosis, 1t edemal rt, trace left MS- no significant deformity or atrophy Skin- no rash or lesion Psych- euthymic mood, full affect Neuro- strength and sensation are intact  ekg today reveals SR , rightward axis, pr int 204 ms, qrs int 70 ms, qtc 418 ms Epic records reviewed.   Assessment and Plan:  1. PAF   Staying in SR Coninue NOAC Continue  diltazem   2. Stage IV lung CA Stable  3. Adrenal Mass Treated  3. Hypertensive cardiovascular disease Stable No change required today  5. Gallbladder disease S/p cholecystectomy and doing well    F/u in 6 months, sooner if any afib.   Geroge Baseman Nonie Lochner, [facility] [address] [place], [place] [address] [phone]

## 2015-10-26 NOTE — Telephone Encounter (Signed)
Dr Jaynee Eagles-- referral for PT was sent to [facility], next door.

## 2015-10-26 NOTE — Telephone Encounter (Signed)
Anthony Curry - would you call patient and see where he is scheduled for Physical therapy? I would like to try and call them before her goes thanks.

## 2015-10-29 IMAGING — RF DG SINUS / FISTULA TRACT / ABSCESSOGRAM
5 series · 5 of 5 positions shown · IV contrast (omnipaque)
Comparison: Cholecystostomy tube placement images 7 08/25/2015

CLINICAL DATA: 73-year-old male with stage IV non-small cell lung
cancer and recent episode of acute calculus cholecystitis treated by
percutaneous cholecystostomy tube placement on 06/13/2015. He
presents for cholangiogram through existing biliary tube.

EXAM:
CHOLANGIOGRAM THROUGH EXISTING ACCESS
CONTRAST:  15 mL Omnipaque 300
FLUOROSCOPY TIME:  Radiation Exposure Index (as provided by the
fluoroscopic device): 21 dGy

[Series 1: run · 1 of 1 slices shown (1 of 5)]
[im 1/1]
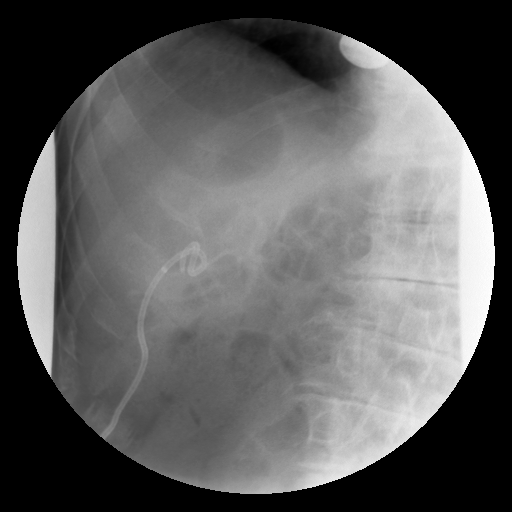

[Series 2: run · 1 of 1 slices shown (2 of 5)]
[im 1/1]
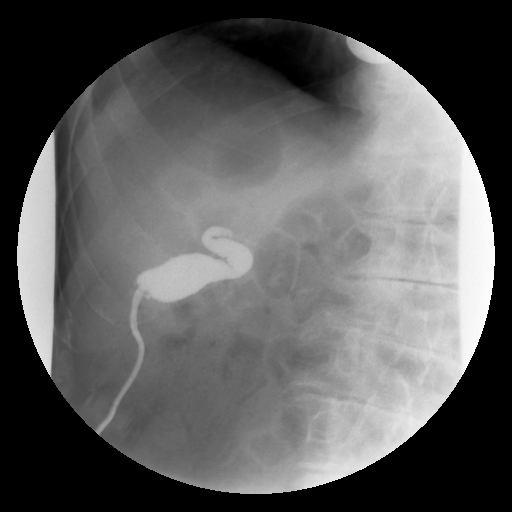

[Series 3: run · 1 of 1 slices shown (3 of 5)]
[im 1/1]
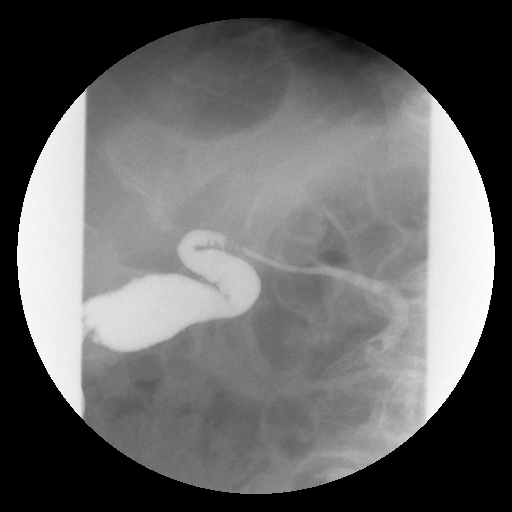

[Series 4: run · 1 of 1 slices shown (4 of 5)]
[im 1/1]
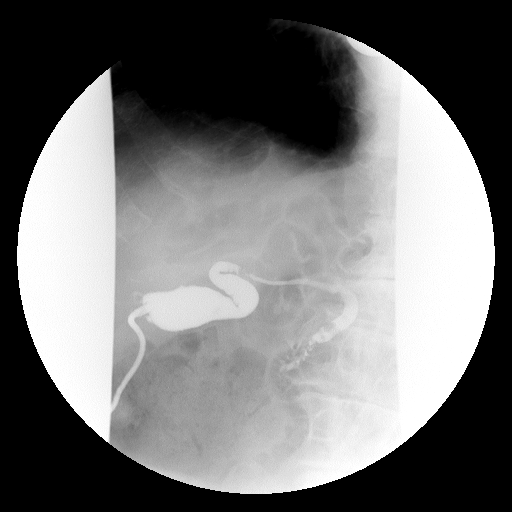

[Series 5: run · 1 of 1 slices shown (5 of 5)]
[im 1/1]
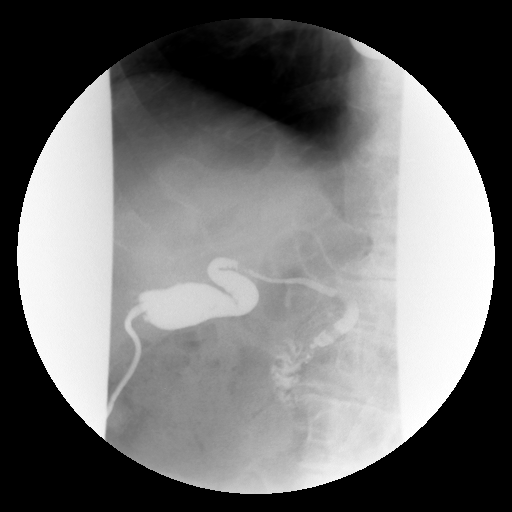

[5 of 5 positions shown; findings below may reference images not displayed]

FINDINGS: An initial spot image demonstrates the tube in the expected location
in the right upper quadrant. Hand injection of contrast material was
performed under real-time fluoroscopic imaging. The gallbladder is
contracted. After sufficient pressurization, contrast material
slowly fills the cystic duct. There is a focal filling defect in the
cystic duct consistent with the presence of a small stone. This is
incompletely occlusive. Contrast material eventually passes through
the remainder the cystic duct and enters the common bile duct.

There is a second filling defect in the distal common bile duct
consistent with choledocholithiasis. Again, this is not obstructing
as contrast material does pass beyond this stone, through the
ampulla and into the duodenum. The drain was capped.
IMPRESSION: 1. Cholelithiasis with an incompletely obstructing stone in the
cystic duct.
2. Positive for choledocholithiasis. There is a filling defect in
the distal common bile duct which is not occlusive. Contrast
material passes around the stone and into the duodenum.

## 2015-10-31 ENCOUNTER — Ambulatory Visit
Admission: RE | Admit: 2015-10-31 | Discharge: 2015-10-31 | Disposition: A | Discharge status: Home / Self Care | Payer: Medicare Other | Source: Ambulatory Visit | Attending: Neurology | Admitting: Neurology

## 2015-10-31 DIAGNOSIS — R269 Unspecified abnormalities of gait and mobility: Secondary | ICD-10-CM

## 2015-10-31 DIAGNOSIS — R42 Dizziness and giddiness: Secondary | ICD-10-CM

## 2015-10-31 DIAGNOSIS — M25879 Other specified joint disorders, unspecified ankle and foot: Secondary | ICD-10-CM | POA: Diagnosis not present

## 2015-10-31 DIAGNOSIS — R2689 Other abnormalities of gait and mobility: Secondary | ICD-10-CM

## 2015-10-31 DIAGNOSIS — G1221 Amyotrophic lateral sclerosis: Secondary | ICD-10-CM

## 2015-10-31 DIAGNOSIS — G1229 Other motor neuron disease: Secondary | ICD-10-CM

## 2015-10-31 DIAGNOSIS — R270 Ataxia, unspecified: Secondary | ICD-10-CM

## 2015-10-31 DIAGNOSIS — G629 Polyneuropathy, unspecified: Secondary | ICD-10-CM

## 2015-11-02 ENCOUNTER — Telehealth: Payer: Self-pay | Admitting: *Deleted

## 2015-11-02 NOTE — Telephone Encounter (Signed)
LVM for pt to call about results. Gave [facility] phone number.  

## 2015-11-02 NOTE — Telephone Encounter (Signed)
-----   Message from Melvenia Beam, MD sent at 11/02/2015  1:10 PM EST ----- [name], let patient know he has arthritic changes in his neck and back however the spinal cord looks normal so there is no reason to believe his balance issues are caused by a spinal cord problem. So this has been ruled out, I still believe the diagnosis we made at his appointment is correct. Does he have a PT appointment yet?

## 2015-11-02 NOTE — Telephone Encounter (Signed)
Pt called office back. Relayed results per Dr Jaynee Eagles. Pt verbalized understanding. Pt has appt with physical therapy tomorrow.

## 2015-11-03 ENCOUNTER — Other Ambulatory Visit: Payer: Self-pay

## 2015-11-03 ENCOUNTER — Encounter: Payer: Self-pay | Admitting: Physical Therapy

## 2015-11-03 ENCOUNTER — Ambulatory Visit: Payer: Medicare Other | Attending: Neurology | Admitting: Physical Therapy

## 2015-11-03 DIAGNOSIS — R2681 Unsteadiness on feet: Secondary | ICD-10-CM | POA: Diagnosis not present

## 2015-11-03 DIAGNOSIS — R269 Unspecified abnormalities of gait and mobility: Secondary | ICD-10-CM | POA: Insufficient documentation

## 2015-11-03 NOTE — Telephone Encounter (Signed)
Spoke to his physical therapist this morning. Discussed particular therappy needs and causes for his symptoms for therapist to take it into account.

## 2015-11-04 ENCOUNTER — Encounter: Payer: Self-pay | Admitting: Internal Medicine

## 2015-11-04 ENCOUNTER — Other Ambulatory Visit (HOSPITAL_BASED_OUTPATIENT_CLINIC_OR_DEPARTMENT_OTHER): Payer: Medicare Other

## 2015-11-04 ENCOUNTER — Ambulatory Visit (HOSPITAL_BASED_OUTPATIENT_CLINIC_OR_DEPARTMENT_OTHER): Payer: Medicare Other | Admitting: Internal Medicine

## 2015-11-04 ENCOUNTER — Ambulatory Visit (HOSPITAL_BASED_OUTPATIENT_CLINIC_OR_DEPARTMENT_OTHER): Payer: Medicare Other

## 2015-11-04 VITALS — BP 126/66 | HR 90 | Temp 98.3°F | Resp 18 | Ht 71.0 in | Wt 152.6 lb

## 2015-11-04 DIAGNOSIS — C3431 Malignant neoplasm of lower lobe, right bronchus or lung: Secondary | ICD-10-CM

## 2015-11-04 DIAGNOSIS — C7972 Secondary malignant neoplasm of left adrenal gland: Secondary | ICD-10-CM

## 2015-11-04 DIAGNOSIS — C3490 Malignant neoplasm of unspecified part of unspecified bronchus or lung: Secondary | ICD-10-CM

## 2015-11-04 DIAGNOSIS — Z5112 Encounter for antineoplastic immunotherapy: Secondary | ICD-10-CM | POA: Diagnosis present

## 2015-11-04 DIAGNOSIS — I4891 Unspecified atrial fibrillation: Secondary | ICD-10-CM | POA: Diagnosis not present

## 2015-11-04 LAB — CBC WITH DIFFERENTIAL/PLATELET
BASO%: 0.4 % (ref 0.0–2.0)
BASOS ABS: 0 10*3/uL (ref 0.0–0.1)
EOS ABS: 0.3 10*3/uL (ref 0.0–0.5)
EOS%: 6.3 % (ref 0.0–7.0)
HCT: 39.9 % (ref 38.4–49.9)
HGB: 14 g/dL (ref 13.0–17.1)
LYMPH%: 23.7 % (ref 14.0–49.0)
MCH: 31.6 pg (ref 27.2–33.4)
MCHC: 35.1 g/dL (ref 32.0–36.0)
MCV: 90.1 fL (ref 79.3–98.0)
MONO#: 0.7 10*3/uL (ref 0.1–0.9)
MONO%: 15 % — AB (ref 0.0–14.0)
NEUT#: 2.5 10*3/uL (ref 1.5–6.5)
NEUT%: 54.6 % (ref 39.0–75.0)
Platelets: 231 10*3/uL (ref 140–400)
RBC: 4.43 10*6/uL (ref 4.20–5.82)
RDW: 14.7 % — ABNORMAL HIGH (ref 11.0–14.6)
WBC: 4.6 10*3/uL (ref 4.0–10.3)
lymph#: 1.1 10*3/uL (ref 0.9–3.3)

## 2015-11-04 LAB — COMPREHENSIVE METABOLIC PANEL (CC13)
ALT: 16 U/L (ref 0–55)
ANION GAP: 7 meq/L (ref 3–11)
AST: 22 U/L (ref 5–34)
Albumin: 3.9 g/dL (ref 3.5–5.0)
Alkaline Phosphatase: 106 U/L (ref 40–150)
BUN: 15 mg/dL (ref 7.0–26.0)
CO2: 24 meq/L (ref 22–29)
Calcium: 9.6 mg/dL (ref 8.4–10.4)
Chloride: 104 mEq/L (ref 98–109)
Creatinine: 0.8 mg/dL (ref 0.7–1.3)
EGFR: 88 mL/min/{1.73_m2} — AB (ref >90)
GLUCOSE: 79 mg/dL (ref 70–140)
POTASSIUM: 3.6 meq/L (ref 3.5–5.1)
SODIUM: 136 meq/L (ref 136–145)
Total Bilirubin: 0.61 mg/dL (ref 0.20–1.20)
Total Protein: 6.6 g/dL (ref 6.4–8.3)

## 2015-11-04 MED ORDER — SODIUM CHLORIDE 0.9 % IV SOLN
240.0000 mg | Freq: Once | INTRAVENOUS | Status: AC
  Administered 2015-11-04: 240 mg via INTRAVENOUS
  Filled 2015-11-04: qty 20

## 2015-11-04 MED ORDER — SODIUM CHLORIDE 0.9 % IV SOLN
Freq: Once | INTRAVENOUS | Status: AC
  Administered 2015-11-04: 13:00:00 via INTRAVENOUS

## 2015-11-04 NOTE — Patient Instructions (Signed)
[facility] Discharge Instructions for Patients Receiving Chemotherapy  Today you received the following chemotherapy agents Nivolumab.  To help prevent nausea and vomiting after your treatment, we encourage you to take your nausea medication as prescribed.   If you develop nausea and vomiting that is not controlled by your nausea medication, call the clinic.   BELOW ARE SYMPTOMS THAT SHOULD BE REPORTED IMMEDIATELY:  *FEVER GREATER THAN 100.5 F  *CHILLS WITH OR WITHOUT FEVER  NAUSEA AND VOMITING THAT IS NOT CONTROLLED WITH YOUR NAUSEA MEDICATION  *UNUSUAL SHORTNESS OF BREATH  *UNUSUAL BRUISING OR BLEEDING  TENDERNESS IN MOUTH AND THROAT WITH OR WITHOUT PRESENCE OF ULCERS  *URINARY PROBLEMS  *BOWEL PROBLEMS  UNUSUAL RASH Items with * indicate a potential emergency and should be followed up as soon as possible.  Feel free to call the clinic you have any questions or concerns. The clinic phone number is [phone].  Please show the CHEMO ALERT CARD at check-in to the Emergency Department and triage nurse.   

## 2015-11-04 NOTE — Progress Notes (Signed)
[facility] [phone]   Fax:([phone]) [phone]  OFFICE PROGRESS NOTE  Lolita Patella, MD [phone] [address] [place] [address]  DIAGNOSIS: Stage IV (T1 A., N0, M1 B.) non-small cell lung cancer, adenocarcinoma with negative EGFR mutation and negative ALK gene translocation diagnosed in October of 2014.   Molecular profile: Negative for RET, ALK, BRAF, MET, EGFR.  Positive for ERBB2 A775T, ZSW1U932T, KRAS G13E, MAP2K1 D67N (see full report).   PRIOR THERAPY:  1) Systemic chemotherapy with carboplatin for AUC of 5 and Alimta 500 mg/M2 every 3 weeks, status post 6 cycles with stable disease. First dose on 10/23/2013. 2) Maintenance systemic chemotherapy with single agent Alimta 500 mg/M2 every 3 weeks. Status post 8 cycles.   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 Roselind Messier completed on 12/16/2014.  CURRENT THERAPY: Immunotherapy with Nivolumab 3 MG/KG every 2 weeks, status post 13 cycles. First cycle 01/28/2015  CHEMOTHERAPY INTENT: Palliative  CURRENT # OF CHEMOTHERAPY CYCLES: 14 CURRENT ANTIEMETICS: Zofran, Compazine and dexamethasone  CURRENT SMOKING STATUS: Former smoker  ORAL CHEMOTHERAPY AND CONSENT: None  CURRENT BISPHOSPHONATES USE: None  PAIN MANAGEMENT: 0/10  NARCOTICS INDUCED CONSTIPATION: None  LIVING WILL AND CODE STATUS: ?   INTERVAL HISTORY: Marvon Shillingburg 74 y.o. male returns to the clinic today for followup visit accompanied by his wife. The patient is feeling fine today with no specific complaints. He tolerated the last cycle of his treatment fairly well with no significant adverse effects. He had several imaging studies including MRI of the cervical and thoracic spine for evaluation of back pain and neuropathy and it showed degenerative disease. He denied having any significant chest pain, shortness of breath, cough or hemoptysis. He denied having any nausea or vomiting. He  is here today to start cycle #14 of his immunotherapy.  MEDICAL HISTORY: Past Medical History  Diagnosis Date  . Hypertension   . GERD (gastroesophageal reflux disease)   . Alcohol abuse   . 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 Low sodium   . Persistent atrial fibrillation (HCC) 04/11/14    chads2vasc score is 2  . Hypotension   . Atrial fibrillation with RVR (HCC)     Dr. Allred,cardiology follows LOV 07-30-15  . HCAP (healthcare-associated pneumonia)   . 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HCC)   . BPH (benign prostatic hyperplasia)   . Hyponatremia   . Anemia of chronic disease   . Fall   . Alcohol intoxication (HCC)   . Pulmonary nodules   . Adrenal mass (HCC)   . Atrial flutter by electrocardiogram (HCC)   . 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 Alcoholism in remission (HCC)   . Radiation 12/02/14-12/16/14    Left adrenal gland 30 Gy in 10 fractions  . Dysrhythmia     hx. atrial fibrillation-hx cardiversion- no recent issues- sees Dr. Johney Frame  . Complication of anesthesia 09/08/2015     JITTERY AFTER GALLBLADDER SURGERY  . Anxiety   . Cancer (HCC)     small cell/lung  . Neoplastic malignant related fatigue     IV tx. every 2 weeks last9-14-16 (Dr. Shirline Frees), radiation last tx. 6 months    ALLERGIES:  has No Known Allergies.  MEDICATIONS:  Current Outpatient Prescriptions  Medication Sig Dispense Refill  . ALPRAZolam (XANAX) 0.25 MG tablet Take 0.25 mg by mouth 3 (three) times daily as needed for anxiety or sleep. And also at night for sleep.    [name]  apixaban (ELIQUIS) 5 MG TABS tablet Take 5 mg by mouth 2 (two) times daily.    . Ascorbic Acid (VITAMIN C PO) Take 1,000 mg by mouth daily.     . B Complex-C (SUPER B COMPLEX PO) Take 1 each by mouth daily.     . calcium carbonate (OS-CAL) 600 MG TABS Take 600 mg by mouth daily with breakfast.    . demeclocycline (DECLOMYCIN) 150 MG tablet Take 150 mg by mouth 2 (two) times daily.    [name] diltiazem (CARDIZEM CD) 180 MG  24 hr capsule Take 1 capsule (180 mg total) by mouth daily at 12 noon. 90 capsule 3  . furosemide (LASIX) 40 MG tablet Take 0.5 tablets (20 mg total) by mouth daily. (Patient taking differently: Take 20 mg by mouth every other day. ) 90 tablet 3  . HYDROcodone-acetaminophen (NORCO) 5-325 MG tablet Take 1-2 tablets by mouth every 6 (six) hours as needed for moderate pain or severe pain. 30 tablet 0  . HYDROcodone-acetaminophen (NORCO/VICODIN) 5-325 MG per tablet Take 1-2 tablets by mouth every 4 (four) hours as needed for moderate pain. 30 tablet 0  . KLOR-CON M20 20 MEQ tablet Take 20 mEq by mouth every morning.     . Lactobacillus (ACIDOPHILUS) CAPS capsule Take 1 capsule by mouth daily.    [name] MAGNESIUM PO Take 400 mg by mouth daily.     . mirtazapine (REMERON) 15 MG tablet Take 1 tablet (15 mg total) by mouth at bedtime. (Patient taking differently: Take 15 mg by mouth at bedtime as needed (sleep). ) 30 tablet 0  . Multiple Vitamin (MULTIVITAMIN WITH MINERALS) TABS Take 1 tablet by mouth daily.    . Nivolumab (OPDIVO IV) Inject as directed every 2 weeks    . Nutritional Supplements (ENSURE COMPLETE PO) Take 1 Can by mouth 2 (two) times daily.    . Omega-3 Fatty Acids (FISH OIL PO) Take 1 tablet by mouth daily.    . pantoprazole (PROTONIX) 40 MG tablet Take 40 mg by mouth every morning.  8  . tamsulosin (FLOMAX) 0.4 MG CAPS capsule Take 0.4 mg by mouth daily at 12 noon.     . thiamine (VITAMIN B-1) 100 MG tablet Take 1 tablet (100 mg total) by mouth daily. 30 tablet 6  . vitamin E 400 UNIT capsule Take 400 Units by mouth daily.     No current facility-administered medications for this visit.    SURGICAL HISTORY:  Past Surgical History  Procedure Laterality Date  . Hemorrhoid surgery    . Ankle arthrodesis  1995    rt fx-hardware in  . Tonsillectomy    . Colonoscopy    . Excision / curettage bone cyst finger  2005    rt thumb  . Total shoulder arthroplasty  02/03/2012    Procedure: TOTAL  SHOULDER ARTHROPLASTY;  Surgeon: [name], MD;  Location: [facility];  Service: Orthopedics;  Laterality: Left;  . Cardioversion N/A 09/29/2014    Procedure: CARDIOVERSION;  Surgeon: Josue Hector, MD;  Location: [facility] ENDOSCOPY;  Service: Cardiovascular;  Laterality: N/A;  . Percutaneous cholecytostomy tube      IVR insertion 06-13-15(Dr. Vernard Gambles)- 08-28-15 remains in place to drainage bag  . Ercp N/A 09/02/2015    Procedure: ENDOSCOPIC RETROGRADE CHOLANGIOPANCREATOGRAPHY (ERCP);  Surgeon: Arta Silence, MD;  Location: [facility];  Service: Endoscopy;  Laterality: N/A;  . Ercp N/A 09/07/2015    Procedure: ENDOSCOPIC RETROGRADE CHOLANGIOPANCREATOGRAPHY (ERCP);  Surgeon: Clarene Essex, MD;  Location:  [facility] ENDOSCOPY;  Service: Endoscopy;  Laterality: N/A;  . Cholecystectomy  09/08/2015     laproscopic   . Cholecystectomy N/A 09/08/2015    Procedure: LAPAROSCOPIC CHOLECYSTECTOMY WITH ATTEMPTED INTRAOPERATIVE CHOLANGIOGRAM ;  Surgeon: Fanny Skates, MD;  Location: [place];  Service: General;  Laterality: N/A;    REVIEW OF SYSTEMS:  A comprehensive review of systems was negative except for: Neurological: positive for paresthesia   PHYSICAL EXAMINATION: General appearance: alert, cooperative, fatigued and no distress Head: Normocephalic, without obvious abnormality, atraumatic Neck: no adenopathy, no JVD, supple, symmetrical, trachea midline and thyroid not enlarged, symmetric, no tenderness/mass/nodules Lymph nodes: Cervical, supraclavicular, and axillary nodes normal. Resp: clear to auscultation bilaterally Back: symmetric, no curvature. ROM normal. No CVA tenderness. Cardio: regular rate and rhythm, S1, S2 normal, no murmur, click, rub or gallop GI: Right upper quadrant tenderness to palpation Extremities: extremities normal, atraumatic, no cyanosis or edema Neurologic: Alert and oriented X 3, normal strength and tone. Normal symmetric reflexes. Normal coordination and gait  ECOG  PERFORMANCE STATUS: 1 - Symptomatic but completely ambulatory  Blood pressure 126/66, pulse 90, temperature 98.3 F (36.8 C), temperature source Oral, resp. rate 18, height $RemoveBe'5\' 11"'YQoyhyQVy$  (1.803 m), weight 152 lb 9.6 oz (69.219 kg), SpO2 98 %.  LABORATORY DATA: Lab Results  Component Value Date   WBC 4.6 11/04/2015   HGB 14.0 11/04/2015   HCT 39.9 11/04/2015   MCV 90.1 11/04/2015   PLT 231 11/04/2015      Chemistry      Component Value Date/Time   NA 136 11/04/2015 1054   NA 130* 09/09/2015 0530   K 3.6 11/04/2015 1054   K 3.8 09/09/2015 0530   CL 97* 09/09/2015 0530   CO2 24 11/04/2015 1054   CO2 26 09/09/2015 0530   BUN 15.0 11/04/2015 1054   BUN 7 09/09/2015 0530   CREATININE 0.8 11/04/2015 1054   CREATININE 0.76 09/09/2015 0530      Component Value Date/Time   CALCIUM 9.6 11/04/2015 1054   CALCIUM 8.2* 09/09/2015 0530   ALKPHOS 106 11/04/2015 1054   ALKPHOS 74 09/09/2015 0530   AST 22 11/04/2015 1054   AST 41 09/09/2015 0530   ALT 16 11/04/2015 1054   ALT 23 09/09/2015 0530   BILITOT 0.61 11/04/2015 1054   BILITOT 0.6 09/09/2015 0530       RADIOGRAPHIC STUDIES: Mr Cervical Spine Wo Contrast  11/01/2015   [facility] [address], [place], [place] [address] [phone] NEUROIMAGING REPORT STUDY DATE: 10/31/2015 PATIENT NAME: Anthony Curry DOB: May 08, 1941 MRN: [id number] EXAM: MRI of the cervical spine ORDERING CLINICIAN: Sarina Ill M.D. CLINICAL HISTORY: 74 year old man with a gait disturbance and loss of proprioception COMPARISON FILMS: MRI of the cervical spine 09/19/2007. TECHNIQUE: MRI of the cervical spine was obtained utilizing 3 mm sagittal slices from the posterior fossa down to the T3-4 level with T1, T2 and inversion recovery views. In addition 4 mm axial slices from G2-5 down to T1-2 level were included with T2 and gradient echo views. CONTRAST: None IMAGING SITE: [facility],  [address] FINDINGS: :  On sagittal  images, the spine is imaged from above the cervicomedullary junction to T3.   The spinal cord is of normal caliber and signal.   There is reversal of the normally lordotic curve of the cervical spine. There is severe loss of disc height noted at all cervical levels except C2-C3.    The discs and interspaces were further evaluated on axial views  from C2 to T1 as follows: C2 - C3:  There is left uncovertebral spurring and left facet hypertrophy. The left neural foramen is moderately narrowed. There is no definite nerve root compression though there is some encroachment upon the exiting C3 nerve root on the left. C3 - C4:  There is moderate spinal stenosis with an AP diameter of 7.9 mm due mostly to endplate spurring and possibly congenital narrowing of the spinal canal.  There is moderately severe foraminal narrowing to compression of either of the C4 nerve roots.. C4 - C5:  There is borderline spinal stenosis due to endplate spurring and reduced disc height and mild facet hypertrophy. At this level, there is moderately severe foraminal narrowing bilaterally that could lead to compression of either of the C5 nerve roots.. C5 - C6:  There is borderline spinal stenosis due to endplate spurring, disc bulging and mild facet hypertrophy. At this level there is moderate bilateral foraminal narrowing. Although there is no definite nerve root compression there is encroachment upon both of the exiting C6 nerve roots. C6 - C7:  There is mild disc bulging and bilateral uncovertebral spurring. The neural foramina are moderately narrowed bilaterally. There is no definite nerve root compression though there is some encroachment upon both of the exiting C7 nerve roots.. C7 - T1:  There is left greater than right uncovertebral spurring and mild disc bulging. The neural foramina are mildly narrowed and there does not appear to be any nerve root compression.. The study was compared to the MRI of the cervical spine 09/19/2007. In the  interim, there is no significant change. The degree of spinal stenosis at C3-C4 and the degree of multilevel foraminal narrowing was similar in both studies.   11/01/2015   This is an abnormal MRI of the cervical spine show the following: 1.  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 There is moderately severe foraminal narrowing bilaterally at this level that could lead to compression of either of the C4 nerve roots. 2.   Borderline spinal stenosis at C4-C5 and moderately severe foraminal narrowing bilaterally that could lead to compression of either of the C5 nerve root. 3.   Borderline spinal stenosis at C5-C6. There is moderate bilateral foraminal narrowing. There is no definite nerve root compression though there is encroachment upon both C6 nerve roots. 4.   Degenerative changes at C2-C3, C6-C7 and C7-T1 are unlikely to lead to nerve root compression 5.   The spinal cord appears normal. 6.   When compared to the MRI of the cervical spine 09/19/2007, there is no significant change. INTERPRETING PHYSICIAN: Richard A. Felecia Shelling, MD, PhD Certified in  Neuroimaging by [place] [employer]   Mr Thoracic Spine Wo Contrast  11/01/2015  [facility] [address] [place], [place] [address] [phone] NEUROIMAGING REPORT STUDY DATE: 10/31/2015 PATIENT NAME: Anthony Curry DOB: Jun 02, 1941 MRN: [id number] EXAM: MRI of the thoracic spine ORDERING CLINICIAN: Sarina Ill M.D. CLINICAL HISTORY: 74 year old man with a gait disturbance and loss of proprioception in the feet COMPARISON FILMS: None TECHNIQUE: MRI of the thoracic spine was obtained utilizing 3 mm sagittal slices from the posterior fossa from C7 to L1 level with T1, T2 and inversion recovery views. In addition 4 mm axial slices from B7J6 to R67E9 level were included with T2 and gradient echo views. CONTRAST: none IMAGING SITE: [facility],  [place] FINDINGS: :  On scout images, the spine  is imaged from above the  cervicomedullary junction to the mid thoracic spine.   Dedicated thoracic images run from [facility].  The spinal cord is of normal caliber and signal.   There is mild scoliosis convex to the right. Moderate loss of disc height is noted at T1-T2, T2-T3, T3-T4 and T8-T9. Some edema is noted within the inferior endplate of the T9, X91, T11 and T12 vertebral bodies associated with Schmorl's nodes.   The discs and interspaces were further evaluated on axial views from T1 to L1.   At T1-T2, there is right greater than left uncovertebral spurring and mild disc bulging. There is no definite nerve root impingement. At T2-T3 there is minimal uncovertebral spurring and mild disc bulging, more to the left. There is no nerve root impingement. At T3-T4, there is uncovertebral spurring. There is no definite nerve root impingement. At T8-T9, there is broad disc bulging. There is no definite nerve root impingement. At T10-T11, there is right paramedian disc protrusion. There is right foraminal narrowing though there is no definite nerve root impingement.  At  T11-T12, there is mild disc bulging.   At T12-L1, there is mild disc bulging and facet hypertrophy. There is no nerve root impingement.[name]   11/01/2015   This is an abnormal MRI of the thoracic spine showed the following: 1.   There are Schmorl's nodes within the inferior endplates of T9, Y78, G95 and T12. There is some edema in the inferior endplates of these vertebral bodies implying more recent onset. 2.   There are multilevel degenerative changes as detailed above. At T10-T11 there is a right paramedian disc protrusion causing right foraminal narrowing but no definite nerve root compression. There is less potential for nerve root impingement at the other levels. 3.   The spinal cord appears normal 4.   Mild scoliosis convex to the right INTERPRETING PHYSICIAN: Richard A. Sater, MD, PhD Certified in  Neuroimaging by [place] [employer]    ASSESSMENT AND PLAN: This is a very pleasant 74 years old white male with:  1)  Stage IV non-small cell lung cancer, adenocarcinoma status post induction chemotherapy with carboplatin and Alimta and currently on maintenance treatment with single agent Alimta status post 8 cycles. He has been observation for the last few months.  He had evidence for disease progression on the left adrenal gland and the patient underwent palliative radiotherapy to the left adrenal gland under the care of Dr. Sondra Come completed on 12/16/2014. He is currently undergoing treatment with immunotherapy with Nivolumab status post 13 cycles and tolerating his treatment fairly well.  he recovered well from the recent surgery. I recommended for the patient to resume his treatment with Nivolumab. He will start cycle #14 today. He would come back for follow-up visit in 2 weeks for reevaluation with the start of cycle #15 after repeating CT scan of the chest, abdomen and pelvis for restaging of his disease.  2) Atrial fibrillation: He'll continue his current medication with Cardizem, digoxin and metoprolol in addition to Eliquis. He'll also continue his routine followup visit with his cardiologist Dr. [name].  He was advised to call immediately if he has any concerning symptoms in the interval. The patient voices understanding of current disease status and treatment options and is in agreement with the current care plan.  All questions were answered. The patient knows to call the clinic with any problems, questions or concerns. We can certainly see the patient much sooner if necessary.  Disclaimer: This note was dictated with voice recognition software. Similar  sounding words can inadvertently be transcribed and may not be corrected upon review.

## 2015-11-08 ENCOUNTER — Encounter: Payer: Self-pay | Admitting: Physical Therapy

## 2015-11-08 NOTE — Therapy (Signed)
[facility] [address] [place], [place], [address] Phone: [phone]   Fax:  [phone]  Physical Therapy Evaluation  Patient Details  Name: Anthony Curry MRN: [id number] Date of Birth: 10-21-1941 Referring Provider: Dr. Sarina Ill  Encounter Date: 11/03/2015      PT End of Session - 11/08/15 2035    Visit Number 1   Number of Visits 9   Date for PT Re-Evaluation 12/03/15   Authorization Type Medicare   Authorization Time Period 11-03-15 - 01-02-16   PT Start Time 1315   PT Stop Time 1402   PT Time Calculation (min) 47 min      Past Medical History  Diagnosis Date  . Hypertension   . GERD (gastroesophageal reflux disease)   . Alcohol abuse   . 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 Low sodium   . Persistent atrial fibrillation ([place]) 04/11/14    chads2vasc score is 2  . Hypotension   . Atrial fibrillation with RVR ([place])     Dr. Allred,cardiology follows LOV 07-30-15  . HCAP (healthcare-associated pneumonia)   . 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 BPH (benign prostatic hyperplasia)   . Hyponatremia   . Anemia of chronic disease   . Fall   . Alcohol intoxication ([place])   . Pulmonary nodules   . Adrenal mass ([place])   . Atrial flutter by electrocardiogram ([place])   . 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 Alcoholism in remission ([place])   . Radiation 12/02/14-12/16/14    Left adrenal gland 30 Gy in 10 fractions  . Dysrhythmia     hx. atrial fibrillation-hx cardiversion- no recent issues- sees Dr. [name]  . Complication of anesthesia 09/08/2015     JITTERY AFTER GALLBLADDER SURGERY  . Anxiety   . Cancer ([place])     small cell/lung  . Neoplastic malignant related fatigue     IV tx. every 2 weeks last9-14-16 (Dr. Earlie Server), radiation last tx. 6 months    Past Surgical History  Procedure Laterality Date  . Hemorrhoid surgery    . Ankle arthrodesis  1995    rt fx-hardware in  .  Tonsillectomy    . Colonoscopy    . Excision / curettage bone cyst finger  2005    rt thumb  . Total shoulder arthroplasty  02/03/2012    Procedure: TOTAL SHOULDER ARTHROPLASTY;  Surgeon: [name], MD;  Location: [facility];  Service: Orthopedics;  Laterality: Left;  . Cardioversion N/A 09/29/2014    Procedure: CARDIOVERSION;  Surgeon: Josue Hector, MD;  Location: [facility] ENDOSCOPY;  Service: Cardiovascular;  Laterality: N/A;  . Percutaneous cholecytostomy tube      IVR insertion 06-13-15(Dr. Vernard Gambles)- 08-28-15 remains in place to drainage bag  . Ercp N/A 09/02/2015    Procedure: ENDOSCOPIC RETROGRADE CHOLANGIOPANCREATOGRAPHY (ERCP);  Surgeon: Arta Silence, MD;  Location: [facility] ENDOSCOPY;  Service: Endoscopy;  Laterality: N/A;  . Ercp N/A 09/07/2015    Procedure: ENDOSCOPIC RETROGRADE CHOLANGIOPANCREATOGRAPHY (ERCP);  Surgeon: Clarene Essex, MD;  Location: [facility]OSCOPY;  Service: Endoscopy;  Laterality: N/A;  . Cholecystectomy  09/08/2015     laproscopic   . Cholecystectomy N/A 09/08/2015    Procedure: LAPAROSCOPIC CHOLECYSTECTOMY WITH ATTEMPTED INTRAOPERATIVE CHOLANGIOGRAM ;  Surgeon: Fanny Skates, MD;  Location: [facility];  Service: General;  Laterality: N/A;    There were no vitals filed for this visit.  Visit Diagnosis:  Abnormality of gait - Plan: PT plan of care cert/re-cert  Unsteadiness - Plan:  PT plan of care cert/re-cert      Subjective Assessment - 11/08/15 2015    Subjective Pt reports lack of balance which has progressively worsened within past 8 months; pt reports he began having unsteadiness and intermittent vertigo about 8-9 months ago; pt reports he was diagnosed with lung cancer in Nov. 2014; pt is using a cane for assist. with amb.  pt uses cane for assist. with community amb. and uses rollator in his home   Pertinent History lung cancer -diagnosed Nov. 2014; gall bladder removed in Oct. 2016; s/p L TSR 2 yrs ago   Patient Stated Goals Improve balance - be able  to walk without use of walker (rollator) or cane   Currently in Pain? No/denies            [facility] PT Assessment - 11/08/15 0001    Assessment   Medical Diagnosis Gait abnormality; Dysequilibrium   Referring Provider Dr. Sarina Ill   Onset Date/Surgical Date --  Feb. 2016   Precautions   Precautions Fall   Balance Screen   Has the patient fallen in the past 6 months No   Has the patient had a decrease in activity level because of a fear of falling?  Yes   Is the patient reluctant to leave their home because of a fear of falling?  No   Home Environment   Living Environment Private residence   Type of [place] Access Stairs to enter   Entrance Stairs-Number of Steps 1   Entrance Stairs-Rails None   Alternate Level Stairs-Number of Steps 12   Alternate Level Stairs-Rails None   Home Equipment Grab bars - tub/shower;Shower seat   Prior Function   Level of Independence Independent with household mobility with device;Independent with community mobility with device;Independent with basic ADLs   Leisure goes out to eat frequently   ROM / Strength   AROM / PROM / Strength Strength   Strength   Overall Strength Within functional limits for tasks performed   Ambulation/Gait   Ambulation/Gait Yes   Ambulation Distance (Feet) 100 Feet   Assistive device Straight cane   Gait Pattern Decreased step length - right;Decreased step length - left;Decreased trunk rotation;Ataxic   Ambulation Surface Level;Indoor   Gait velocity 2.88  11.40   Berg Balance Test   Sit to Stand Able to stand without using hands and stabilize independently   Standing Unsupported Able to stand safely 2 minutes   Sitting with Back Unsupported but Feet Supported on Floor or Stool Able to sit safely and securely 2 minutes   Stand to Sit Sits safely with minimal use of hands   Transfers Able to transfer safely, minor use of hands   Standing Unsupported with Eyes Closed Needs help to keep from falling    Standing Ubsupported with Feet Together Able to place feet together independently and stand for 1 minute with supervision   From Standing, Reach Forward with Outstretched Arm Can reach confidently >25 cm (10")   From Standing Position, Pick up Object from Floor Able to pick up shoe, needs supervision   From Standing Position, Turn to Look Behind Over each Shoulder Turn sideways only but maintains balance   Turn 360 Degrees Able to turn 360 degrees safely one side only in 4 seconds or less   Standing Unsupported, Alternately Place Feet on Step/Stool Able to complete >2 steps/needs minimal assist   Standing Unsupported, One Foot in Front Able to plae foot ahead of the  other independently and hold 30 seconds   Standing on One Leg Tries to lift leg/unable to hold 3 seconds but remains standing independently   Total Score 40   Timed Up and Go Test   Normal TUG (seconds) 12.81     R single limb stance 4.00 secs;  L SLS 4.13 secs                           PT Long Term Goals - 11/08/15 2049    PT LONG TERM GOAL #1   Title Incr. gait velocity to >/= 3.1 ft/sec with use of cane for incr. gait efficiency.  12-05-15   Time 4   Period Weeks   Status New   PT LONG TERM GOAL #2   Title Incr. Berg balance test score to >/= 45/56 to reduce fall risk.  12-05-15   Baseline 40/56 on 11-03-15   Period Weeks   Status New   PT LONG TERM GOAL #3   Title Independent in HEP for balance exercises. 12-05-15   Time 4   Period Weeks   Status New   PT LONG TERM GOAL #4   Title Perform SOT and establish goal as appropriate.  12-05-15   Time 4   Period Weeks   Status New               Plan - 11/08/15 2037    Clinical Impression Statement Pt has significant balance deficits due to neuropathy in LE's; pt exhibits decr. vestibular function as evidenced by his inability to maintain balance with eyes closed                                                                                    Pt will benefit from skilled therapeutic intervention in order to improve on the following deficits Abnormal gait;Decreased balance;Dizziness;Impaired sensation;Decreased mobility;Decreased coordination;Decreased strength   Rehab Potential Good   PT Frequency 2x / week   PT Duration 4 weeks   PT Treatment/Interventions ADLs/Self Care Home Management;Balance training;Therapeutic exercise;Therapeutic activities;Functional mobility training;Stair training;Gait training;Neuromuscular re-education;Patient/family education;Vestibular   PT Next Visit Plan begin balance HEP   PT Home Exercise Plan balance and vestibular exercises   Consulted and Agree with Plan of Care Patient         Problem List Patient Active Problem List   Diagnosis Date Noted  . Neuropathy ([place]) 10/15/2015  . Acute urinary retention 09/09/2015  . Choledocholithiasis with cholecystitis 09/08/2015  . Common bile duct stone   . Encounter for antineoplastic immunotherapy 06/28/2015  . Atrial fibrillation ([place]) 06/11/2015  . Gynecomastia, male 06/10/2015  . Dizziness 05/11/2015  . Malignant neoplasm of lower lobe of right lung ([place]) 09/17/2014  . Neutropenic fever ([place]) 09/02/2014  . Neoplastic malignant related fatigue 08/13/2014  . Physical deconditioning 08/13/2014  . Hypoalbuminemia 08/13/2014  . Diarrhea 08/13/2014  . Antineoplastic chemotherapy induced anemia(285.3) 08/13/2014  . Atrial fibrillation with RVR ([place]) 07/28/2014  . HCAP (healthcare-associated pneumonia) 07/25/2014  . Alcoholism in remission ([place]) 07/25/2014  . Atrial flutter by electrocardiogram ([place]) 07/25/2014  . Swelling of limb 06/10/2014  . Lung cancer ([place]) 10/15/2013  .  Hypotension 09/10/2013  . Adrenal mass ([place]) 09/09/2013  . 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 Pulmonary nodules 08/19/2013  . Anemia of chronic disease 07/03/2013  . BPH (benign prostatic hyperplasia) 07/03/2013  . GERD (gastroesophageal reflux disease) 07/03/2013   . Fall 07/03/2013  . Alcohol intoxication ([place]) 07/03/2013  . 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 Alcohol abuse 10/15/2011  . Hyponatremia 10/15/2011    DildayJenness Corner, PT 11/08/2015, 8:59 PM  [facility] [address] [place], [place], [address] Phone: [phone]   Fax:  [phone]  Name: Anthony Curry MRN: [id number] Date of Birth: [birth year]

## 2015-11-09 ENCOUNTER — Encounter (HOSPITAL_COMMUNITY): Payer: Self-pay

## 2015-11-09 ENCOUNTER — Ambulatory Visit (HOSPITAL_COMMUNITY)
Admission: RE | Admit: 2015-11-09 | Discharge: 2015-11-09 | Disposition: A | Discharge status: Home / Self Care | Payer: Medicare Other | Source: Ambulatory Visit | Attending: Internal Medicine | Admitting: Internal Medicine

## 2015-11-09 ENCOUNTER — Ambulatory Visit (HOSPITAL_COMMUNITY): Admission: RE | Admit: 2015-11-09 | Payer: Medicare Other | Source: Ambulatory Visit

## 2015-11-09 DIAGNOSIS — C3431 Malignant neoplasm of lower lobe, right bronchus or lung: Secondary | ICD-10-CM | POA: Diagnosis not present

## 2015-11-09 DIAGNOSIS — R918 Other nonspecific abnormal finding of lung field: Secondary | ICD-10-CM | POA: Insufficient documentation

## 2015-11-09 DIAGNOSIS — K769 Liver disease, unspecified: Secondary | ICD-10-CM | POA: Diagnosis not present

## 2015-11-09 DIAGNOSIS — Z9221 Personal history of antineoplastic chemotherapy: Secondary | ICD-10-CM | POA: Insufficient documentation

## 2015-11-09 DIAGNOSIS — N62 Hypertrophy of breast: Secondary | ICD-10-CM | POA: Diagnosis not present

## 2015-11-09 DIAGNOSIS — Z923 Personal history of irradiation: Secondary | ICD-10-CM | POA: Insufficient documentation

## 2015-11-09 DIAGNOSIS — C3490 Malignant neoplasm of unspecified part of unspecified bronchus or lung: Secondary | ICD-10-CM | POA: Diagnosis not present

## 2015-11-09 DIAGNOSIS — I700 Atherosclerosis of aorta: Secondary | ICD-10-CM | POA: Diagnosis not present

## 2015-11-09 MED ORDER — IOHEXOL 300 MG/ML  SOLN
100.0000 mL | Freq: Once | INTRAMUSCULAR | Status: AC | PRN
  Administered 2015-11-09: 100 mL via INTRAVENOUS

## 2015-11-16 ENCOUNTER — Ambulatory Visit (INDEPENDENT_AMBULATORY_CARE_PROVIDER_SITE_OTHER): Payer: Medicare Other | Admitting: Neurology

## 2015-11-16 ENCOUNTER — Ambulatory Visit (INDEPENDENT_AMBULATORY_CARE_PROVIDER_SITE_OTHER): Payer: Self-pay | Admitting: Neurology

## 2015-11-16 ENCOUNTER — Ambulatory Visit: Payer: Medicare Other | Attending: Neurology | Admitting: Physical Therapy

## 2015-11-16 DIAGNOSIS — R270 Ataxia, unspecified: Secondary | ICD-10-CM

## 2015-11-16 DIAGNOSIS — G1221 Amyotrophic lateral sclerosis: Secondary | ICD-10-CM

## 2015-11-16 DIAGNOSIS — R42 Dizziness and giddiness: Secondary | ICD-10-CM

## 2015-11-16 DIAGNOSIS — M25879 Other specified joint disorders, unspecified ankle and foot: Secondary | ICD-10-CM

## 2015-11-16 DIAGNOSIS — R269 Unspecified abnormalities of gait and mobility: Secondary | ICD-10-CM

## 2015-11-16 DIAGNOSIS — Z0289 Encounter for other administrative examinations: Secondary | ICD-10-CM

## 2015-11-16 DIAGNOSIS — G1229 Other motor neuron disease: Secondary | ICD-10-CM

## 2015-11-16 DIAGNOSIS — R2681 Unsteadiness on feet: Secondary | ICD-10-CM | POA: Diagnosis not present

## 2015-11-16 DIAGNOSIS — R2689 Other abnormalities of gait and mobility: Secondary | ICD-10-CM | POA: Diagnosis not present

## 2015-11-16 DIAGNOSIS — G629 Polyneuropathy, unspecified: Secondary | ICD-10-CM

## 2015-11-16 NOTE — Progress Notes (Signed)
  [facility]    Provider:  Dr Jaynee Eagles Referring Provider: [name], MD Primary Care Physician:  [name], MD   HPI: 74 y.o. male here as a referral from Dr. Alyson Ingles for balance difficulty, feeling of dizziness, proprioception difficulties. He has a PMHx of Stage IV non-small cell lung cancer, adenocarcinoma diagnosed in October of 2014 s/p systemic chemotherapy with carboplatin status post 6 cycles first dose on 10/23/2013, Maintenance systemic chemotherapy with single agent Alimta status post 8 cycles, Palliative radiotherapy to the left adrenal gland metastasis under the care of Dr. Sondra Come completed on 12/16/2014, currently on Nivolumab. His exam is significant for mixed (small and large) fiber neuropathy including absent proprioception and vibration at the distal lower extremities, +Romberg, decr pp and temp in a glove and stocking distribution. His significant large-fiber sensory loss and proprioceptive loss is likely the cause of his balance problems.  Summary  Nerve conduction studies were performed on the bilateral ower extremities:  The right Peroneal motor nerve showed decreased amplitude(21m, N>2) with delayed F Wave latency(523m N<56) The left Peroneal motor nerve showed decreased amplitude(0.8337mN>2) with normal F Wave latency The right Tibial motor nerve showed decreased amplitude(2.6mV32m>3) with normal F Wave latency The left Tibial motor nerve showed decreased amplitude(2mV,45m3) with normal F Wave latency The right Sural sensory nerve showed delayed distal peak latency (4.6ms, 75m.2) The left Sural sensory nerve showed delayed distal peak latency (4.5ms, N21m2) The bilateral radial nerves were within normal limits. Bilateral H Reflexes showed no response  EMG Needle study was performed on selected right lower extremity muscles:   The right Medial Gastrocnemius showed increased spontaneous activity (+2 positive waves), the right  Extensor Hallucis Longus showed increased spontaneous activity (+2 positive waves), increased motor unit amplitude, diminished motor unit recruitment and prolonged motor unit duration. The right Abductor Hallucis showed increased spontaneous activity (+3 positive waves), increased motor unit amplitude, diminished motor unit recruitment and prolonged motor unit duration. The right Vastus Medialis, right Anterior Tibialis muscles were within normal limits.   Conclusion:  There is electrophysiologic evidence for severe, axonal, length-dependent sensorimotor polyneuropathy.   AntoniaSarina IlluilforFa[facility] Hospital[facility] [id number] [phone]

## 2015-11-16 NOTE — Progress Notes (Signed)
See procedure note.

## 2015-11-17 ENCOUNTER — Ambulatory Visit: Payer: Medicare Other | Admitting: Physical Therapy

## 2015-11-17 DIAGNOSIS — R269 Unspecified abnormalities of gait and mobility: Secondary | ICD-10-CM

## 2015-11-17 DIAGNOSIS — R2681 Unsteadiness on feet: Secondary | ICD-10-CM | POA: Diagnosis not present

## 2015-11-18 ENCOUNTER — Other Ambulatory Visit (HOSPITAL_BASED_OUTPATIENT_CLINIC_OR_DEPARTMENT_OTHER): Payer: Medicare Other

## 2015-11-18 ENCOUNTER — Encounter: Payer: Self-pay | Admitting: Physical Therapy

## 2015-11-18 ENCOUNTER — Other Ambulatory Visit: Payer: Self-pay | Admitting: *Deleted

## 2015-11-18 ENCOUNTER — Ambulatory Visit (HOSPITAL_BASED_OUTPATIENT_CLINIC_OR_DEPARTMENT_OTHER): Payer: Medicare Other

## 2015-11-18 ENCOUNTER — Other Ambulatory Visit: Payer: Self-pay | Admitting: Physician Assistant

## 2015-11-18 ENCOUNTER — Ambulatory Visit (HOSPITAL_BASED_OUTPATIENT_CLINIC_OR_DEPARTMENT_OTHER): Payer: Medicare Other | Admitting: Physician Assistant

## 2015-11-18 VITALS — BP 138/69 | HR 77 | Resp 16 | Ht 71.0 in | Wt 158.7 lb

## 2015-11-18 DIAGNOSIS — C3490 Malignant neoplasm of unspecified part of unspecified bronchus or lung: Secondary | ICD-10-CM

## 2015-11-18 DIAGNOSIS — I4891 Unspecified atrial fibrillation: Secondary | ICD-10-CM

## 2015-11-18 DIAGNOSIS — Z5112 Encounter for antineoplastic immunotherapy: Secondary | ICD-10-CM

## 2015-11-18 DIAGNOSIS — I482 Chronic atrial fibrillation, unspecified: Secondary | ICD-10-CM

## 2015-11-18 DIAGNOSIS — C3431 Malignant neoplasm of lower lobe, right bronchus or lung: Secondary | ICD-10-CM

## 2015-11-18 DIAGNOSIS — G893 Neoplasm related pain (acute) (chronic): Secondary | ICD-10-CM

## 2015-11-18 LAB — CBC WITH DIFFERENTIAL/PLATELET
BASO%: 0.7 % (ref 0.0–2.0)
BASOS ABS: 0 10*3/uL (ref 0.0–0.1)
EOS ABS: 0.5 10*3/uL (ref 0.0–0.5)
EOS%: 10.9 % — ABNORMAL HIGH (ref 0.0–7.0)
HEMATOCRIT: 39.7 % (ref 38.4–49.9)
HEMOGLOBIN: 13.9 g/dL (ref 13.0–17.1)
LYMPH#: 1.4 10*3/uL (ref 0.9–3.3)
LYMPH%: 34.2 % (ref 14.0–49.0)
MCH: 31.7 pg (ref 27.2–33.4)
MCHC: 35 g/dL (ref 32.0–36.0)
MCV: 90.4 fL (ref 79.3–98.0)
MONO#: 0.6 10*3/uL (ref 0.1–0.9)
MONO%: 14.5 % — ABNORMAL HIGH (ref 0.0–14.0)
NEUT#: 1.7 10*3/uL (ref 1.5–6.5)
NEUT%: 39.7 % (ref 39.0–75.0)
PLATELETS: 241 10*3/uL (ref 140–400)
RBC: 4.39 10*6/uL (ref 4.20–5.82)
RDW: 14.8 % — AB (ref 11.0–14.6)
WBC: 4.2 10*3/uL (ref 4.0–10.3)

## 2015-11-18 LAB — COMPREHENSIVE METABOLIC PANEL
ALBUMIN: 3.9 g/dL (ref 3.5–5.0)
ALK PHOS: 106 U/L (ref 40–150)
ALT: 18 U/L (ref 0–55)
ANION GAP: 8 meq/L (ref 3–11)
AST: 24 U/L (ref 5–34)
BILIRUBIN TOTAL: 0.84 mg/dL (ref 0.20–1.20)
BUN: 14.7 mg/dL (ref 7.0–26.0)
CALCIUM: 9.3 mg/dL (ref 8.4–10.4)
CO2: 24 meq/L (ref 22–29)
Chloride: 101 mEq/L (ref 98–109)
Creatinine: 0.8 mg/dL (ref 0.7–1.3)
EGFR: 88 mL/min/{1.73_m2} — AB (ref >90)
GLUCOSE: 102 mg/dL (ref 70–140)
POTASSIUM: 3.6 meq/L (ref 3.5–5.1)
Sodium: 133 mEq/L — ABNORMAL LOW (ref 136–145)
Total Protein: 6.5 g/dL (ref 6.4–8.3)

## 2015-11-18 MED ORDER — HYDROCODONE-ACETAMINOPHEN 5-325 MG PO TABS: 1.0000 | ORAL_TABLET | ORAL | Status: DC | PRN

## 2015-11-18 MED ORDER — SODIUM CHLORIDE 0.9 % IV SOLN
Freq: Once | INTRAVENOUS | Status: AC
  Administered 2015-11-18: 12:00:00 via INTRAVENOUS

## 2015-11-18 MED ORDER — SODIUM CHLORIDE 0.9 % IV SOLN
240.0000 mg | Freq: Once | INTRAVENOUS | Status: AC
  Administered 2015-11-18: 240 mg via INTRAVENOUS
  Filled 2015-11-18: qty 20

## 2015-11-18 NOTE — Therapy (Signed)
[facility] [address] [place], [place], [address] Phone: [phone]   Fax:  [phone]  Physical Therapy Treatment  Patient Details  Name: Anthony Curry MRN: [id number] Date of Birth: 06/16/1941 Referring Provider: Dr. Sarina Ill  Encounter Date: 11/16/2015      PT End of Session - 11/18/15 2033    Visit Number 2   Number of Visits 9   Date for PT Re-Evaluation 12/03/15   Authorization Type Medicare   Authorization Time Period 11-03-15 - 01-02-16   PT Start Time 1100   PT Stop Time 1145   PT Time Calculation (min) 45 min      Past Medical History  Diagnosis Date  . Hypertension   . GERD (gastroesophageal reflux disease)   . Alcohol abuse   . 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 Low sodium   . Persistent atrial fibrillation ([place]) 04/11/14    chads2vasc score is 2  . Hypotension   . Atrial fibrillation with RVR ([place])     Dr. Allred,cardiology follows LOV 07-30-15  . HCAP (healthcare-associated pneumonia)   . 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 BPH (benign prostatic hyperplasia)   . Hyponatremia   . Anemia of chronic disease   . Fall   . Alcohol intoxication ([place])   . Pulmonary nodules   . Adrenal mass ([place])   . Atrial flutter by electrocardiogram ([place])   . 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 Alcoholism in remission ([place])   . Radiation 12/02/14-12/16/14    Left adrenal gland 30 Gy in 10 fractions  . Dysrhythmia     hx. atrial fibrillation-hx cardiversion- no recent issues- sees Dr. [name]  . Complication of anesthesia 09/08/2015     JITTERY AFTER GALLBLADDER SURGERY  . Anxiety   . Cancer ([place])     small cell/lung  . Neoplastic malignant related fatigue     IV tx. every 2 weeks last9-14-16 (Dr. Earlie Server), radiation last tx. 6 months    Past Surgical History  Procedure Laterality Date  . Hemorrhoid surgery    . Ankle arthrodesis  1995    rt fx-hardware in  .  Tonsillectomy    . Colonoscopy    . Excision / curettage bone cyst finger  2005    rt thumb  . Total shoulder arthroplasty  02/03/2012    Procedure: TOTAL SHOULDER ARTHROPLASTY;  Surgeon: [name], MD;  Location: [facility];  Service: Orthopedics;  Laterality: Left;  . Cardioversion N/A 09/29/2014    Procedure: CARDIOVERSION;  Surgeon: Josue Hector, MD;  Location: [facility] ENDOSCOPY;  Service: Cardiovascular;  Laterality: N/A;  . Percutaneous cholecytostomy tube      IVR insertion 06-13-15(Dr. Vernard Gambles)- 08-28-15 remains in place to drainage bag  . Ercp N/A 09/02/2015    Procedure: ENDOSCOPIC RETROGRADE CHOLANGIOPANCREATOGRAPHY (ERCP);  Surgeon: Arta Silence, MD;  Location: [facility];  Service: Endoscopy;  Laterality: N/A;  . Ercp N/A 09/07/2015    Procedure: ENDOSCOPIC RETROGRADE CHOLANGIOPANCREATOGRAPHY (ERCP);  Surgeon: Clarene Essex, MD;  Location: [facility];  Service: Endoscopy;  Laterality: N/A;  . Cholecystectomy  09/08/2015     laproscopic   . Cholecystectomy N/A 09/08/2015    Procedure: LAPAROSCOPIC CHOLECYSTECTOMY WITH ATTEMPTED INTRAOPERATIVE CHOLANGIOGRAM ;  Surgeon: Fanny Skates, MD;  Location: [facility];  Service: General;  Laterality: N/A;    There were no vitals filed for this visit.  Visit Diagnosis:  Abnormality of gait  Unsteadiness      Subjective Assessment - 11/18/15  2027    Subjective Pt reports no problems or changes since last visit - has been busy and not had time to do the exercises yet   Pertinent History lung cancer -diagnosed Nov. 2014; gall bladder removed in Oct. 2016; s/p L TSR 2 yrs ago   Patient Stated Goals Improve balance - be able to walk without use of walker (rollator) or cane   Currently in Pain? No/denies                              Balance Exercises - 11/18/15 2029    Balance Exercises: Standing   SLS Eyes open;Solid surface;2 reps   Partial Tandem Stance Eyes open;1 rep;Upper extremity support 1    Sidestepping 2 reps  inside bars      Pt performed kicks - 3 directions - 10 reps each; sidestepping and marching x 10 reps each with UE support prn Sit to stand x 5 reps with UE support prn Stepping over and back of balance beam 10 reps each leg with UE support prn  Tandem walking 10' x 2 reps inside bars with UE support           PT Long Term Goals - 11/08/15 2049    PT LONG TERM GOAL #1   Title Incr. gait velocity to >/= 3.1 ft/sec with use of cane for incr. gait efficiency.  12-05-15   Time 4   Period Weeks   Status New   PT LONG TERM GOAL #2   Title Incr. Berg balance test score to >/= 45/56 to reduce fall risk.  12-05-15   Baseline 40/56 on 11-03-15   Period Weeks   Status New   PT LONG TERM GOAL #3   Title Independent in HEP for balance exercises. 12-05-15   Time 4   Period Weeks   Status New   PT LONG TERM GOAL #4   Title Perform SOT and establish goal as appropriate.  12-05-15   Time 4   Period Weeks   Status New               Plan - 11/18/15 2036    Clinical Impression Statement Pt has minimal vestibular input in maintaining balance as evidenced by LOB with EC; needs UE support for safety with balance exercises   Pt will benefit from skilled therapeutic intervention in order to improve on the following deficits Abnormal gait;Decreased balance;Dizziness;Impaired sensation;Decreased mobility;Decreased coordination;Decreased strength   Rehab Potential Good   PT Frequency 2x / week   PT Duration 4 weeks   PT Treatment/Interventions ADLs/Self Care Home Management;Balance training;Therapeutic exercise;Therapeutic activities;Functional mobility training;Stair training;Gait training;Neuromuscular re-education;Patient/family education;Vestibular   PT Next Visit Plan cont balance exercises   PT Home Exercise Plan balance and vestibular exercises   Consulted and Agree with Plan of Care Patient        Problem List Patient Active Problem List   Diagnosis  Date Noted  . Neuropathy ([place]) 10/15/2015  . Acute urinary retention 09/09/2015  . Choledocholithiasis with cholecystitis 09/08/2015  . Common bile duct stone   . Encounter for antineoplastic immunotherapy 06/28/2015  . Atrial fibrillation ([place]) 06/11/2015  . Gynecomastia, male 06/10/2015  . Dizziness 05/11/2015  . Malignant neoplasm of lower lobe of right lung ([place]) 09/17/2014  . Neutropenic fever ([place]) 09/02/2014  . Neoplastic malignant related fatigue 08/13/2014  . Physical deconditioning 08/13/2014  . Hypoalbuminemia 08/13/2014  . Diarrhea 08/13/2014  . Antineoplastic chemotherapy induced  anemia(285.3) 08/13/2014  . Atrial fibrillation with RVR ([place]) 07/28/2014  . HCAP (healthcare-associated pneumonia) 07/25/2014  . Alcoholism in remission ([place]) 07/25/2014  . Atrial flutter by electrocardiogram ([place]) 07/25/2014  . Swelling of limb 06/10/2014  . Lung cancer ([place]) 10/15/2013  . Hypotension 09/10/2013  . Adrenal mass ([place]) 09/09/2013  . 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 Pulmonary nodules 08/19/2013  . Anemia of chronic disease 07/03/2013  . BPH (benign prostatic hyperplasia) 07/03/2013  . GERD (gastroesophageal reflux disease) 07/03/2013  . Fall 07/03/2013  . Alcohol intoxication ([place]) 07/03/2013  . 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 Alcohol abuse 10/15/2011  . Hyponatremia 10/15/2011    DildayJenness Corner, PT 11/18/2015, 8:40 PM  [facility] [address] [place], [place], [address] Phone: [phone]   Fax:  [phone]  Name: Anthony Curry MRN: [id number] Date of Birth: 03-10-1941

## 2015-11-18 NOTE — Patient Instructions (Signed)
[facility] Discharge Instructions for Patients Receiving Chemotherapy  Today you received the following chemotherapy agents Nivolumab.  To help prevent nausea and vomiting after your treatment, we encourage you to take your nausea medication as prescribed.   If you develop nausea and vomiting that is not controlled by your nausea medication, call the clinic.   BELOW ARE SYMPTOMS THAT SHOULD BE REPORTED IMMEDIATELY:  *FEVER GREATER THAN 100.5 F  *CHILLS WITH OR WITHOUT FEVER  NAUSEA AND VOMITING THAT IS NOT CONTROLLED WITH YOUR NAUSEA MEDICATION  *UNUSUAL SHORTNESS OF BREATH  *UNUSUAL BRUISING OR BLEEDING  TENDERNESS IN MOUTH AND THROAT WITH OR WITHOUT PRESENCE OF ULCERS  *URINARY PROBLEMS  *BOWEL PROBLEMS  UNUSUAL RASH Items with * indicate a potential emergency and should be followed up as soon as possible.  Feel free to call the clinic you have any questions or concerns. The clinic phone number is [phone].  Please show the CHEMO ALERT CARD at check-in to the Emergency Department and triage nurse.   

## 2015-11-18 NOTE — Progress Notes (Signed)
[place] [phone]   Fax:([phone]) [place], [place] [address]  DIAGNOSIS: Stage IV (T1 A., N0, M1 B.) non-small cell lung cancer, adenocarcinoma with negative EGFR mutation and negative ALK gene translocation diagnosed in October of 2014.   Molecular profile: Negative for RET, ALK, BRAF, MET, EGFR.  Positive for ERBB2 A775T, [id number], KRAS G13E, MAP2K1 D67N (see full report).   PRIOR THERAPY:  1) Systemic chemotherapy with carboplatin for AUC of 5 and Alimta 500 mg/M2 every 3 weeks, status post 6 cycles with stable disease. First dose on 10/23/2013. 2) Maintenance systemic chemotherapy with single agent Alimta 500 mg/M2 every 3 weeks. Status post 8 cycles.   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 Sondra Come completed on 12/16/2014.  CURRENT THERAPY: Immunotherapy with Nivolumab 3 MG/KG every 2 weeks, status post 14 cycles. First cycle 01/28/2015  CHEMOTHERAPY INTENT: Palliative  CURRENT # OF CHEMOTHERAPY CYCLES: 14 CURRENT ANTIEMETICS: Zofran, Compazine and dexamethasone  CURRENT SMOKING STATUS: Former smoker  ORAL CHEMOTHERAPY AND CONSENT: None  CURRENT BISPHOSPHONATES USE: None  PAIN MANAGEMENT: 0/10  NARCOTICS INDUCED CONSTIPATION: None  LIVING WILL AND CODE STATUS: ?   INTERVAL HISTORY: Anthony Curry 74 y.o. male returns to the clinic today for followup visit accompanied by his wife. The patient is feeling fine today with no specific complaints. He tolerated the last cycle of his treatment fairly well with no significant adverse effects. He denied having any significant chest pain, shortness of breath, cough or hemoptysis. He denied having any nausea or vomiting.He has some neuropathy and he is to see his neurologist soon.  He is here today to start cycle #15 of his immunotherapy.  MEDICAL HISTORY: Past Medical History    Diagnosis Date  . Hypertension   . GERD (gastroesophageal reflux disease)   . Alcohol abuse   . 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 Low sodium   . Persistent atrial fibrillation ([place]) 04/11/14    chads2vasc score is 2  . Hypotension   . Atrial fibrillation with RVR ([place])     Dr. Allred,cardiology follows LOV 07-30-15  . HCAP (healthcare-associated pneumonia)   . 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 BPH (benign prostatic hyperplasia)   . Hyponatremia   . Anemia of chronic disease   . Fall   . Alcohol intoxication ([facility])   . Pulmonary nodules   . Adrenal mass ([place])   . Atrial flutter by electrocardiogram ([facility])   . 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 Alcoholism in remission ([place])   . Radiation 12/02/14-12/16/14    Left adrenal gland 30 Gy in 10 fractions  . Dysrhythmia     hx. atrial fibrillation-hx cardiversion- no recent issues- sees Dr. [name]  . Complication of anesthesia 09/08/2015     JITTERY AFTER GALLBLADDER SURGERY  . Anxiety   . Cancer ([place])     small cell/lung  . Neoplastic malignant related fatigue     IV tx. every 2 weeks last9-14-16 (Dr. Earlie Server), radiation last tx. 6 months    ALLERGIES:  has No Known Allergies.  MEDICATIONS:  Current Outpatient Prescriptions  Medication Sig Dispense Refill  . ALPRAZolam (XANAX) 0.25 MG tablet Take 0.25 mg by mouth 3 (three) times daily as needed for anxiety or sleep. And also at night for sleep.    [name] apixaban (ELIQUIS) 5 MG TABS tablet Take 5 mg by  mouth 2 (two) times daily.    . Ascorbic Acid (VITAMIN C PO) Take 1,000 mg by mouth daily.     . B Complex-C (SUPER B COMPLEX PO) Take 1 each by mouth daily.     . calcium carbonate (OS-CAL) 600 MG TABS Take 600 mg by mouth daily with breakfast.    . demeclocycline (DECLOMYCIN) 150 MG tablet Take 150 mg by mouth 2 (two) times daily.    [name] diltiazem (CARDIZEM CD) 180 MG 24 hr capsule Take 1 capsule (180 mg total) by mouth daily at 12 noon. 90 capsule 3  .  furosemide (LASIX) 40 MG tablet Take 0.5 tablets (20 mg total) by mouth daily. (Patient taking differently: Take 20 mg by mouth every other day. ) 90 tablet 3  . HYDROcodone-acetaminophen (NORCO) 5-325 MG tablet Take 1-2 tablets by mouth every 6 (six) hours as needed for moderate pain or severe pain. 30 tablet 0  . HYDROcodone-acetaminophen (NORCO/VICODIN) 5-325 MG per tablet Take 1-2 tablets by mouth every 4 (four) hours as needed for moderate pain. 30 tablet 0  . KLOR-CON M20 20 MEQ tablet Take 20 mEq by mouth every morning.     . Lactobacillus (ACIDOPHILUS) CAPS capsule Take 1 capsule by mouth daily.    [name] MAGNESIUM PO Take 400 mg by mouth daily.     . mirtazapine (REMERON) 15 MG tablet Take 1 tablet (15 mg total) by mouth at bedtime. (Patient taking differently: Take 15 mg by mouth at bedtime as needed (sleep). ) 30 tablet 0  . Multiple Vitamin (MULTIVITAMIN WITH MINERALS) TABS Take 1 tablet by mouth daily.    . Nivolumab (OPDIVO IV) Inject as directed every 2 weeks    . Nutritional Supplements (ENSURE COMPLETE PO) Take 1 Can by mouth 2 (two) times daily.    . Omega-3 Fatty Acids (FISH OIL PO) Take 1 tablet by mouth daily.    . pantoprazole (PROTONIX) 40 MG tablet Take 40 mg by mouth every morning.  8  . tamsulosin (FLOMAX) 0.4 MG CAPS capsule Take 0.4 mg by mouth daily at 12 noon.     . thiamine (VITAMIN B-1) 100 MG tablet Take 1 tablet (100 mg total) by mouth daily. 30 tablet 6  . vitamin E 400 UNIT capsule Take 400 Units by mouth daily.     No current facility-administered medications for this visit.    SURGICAL HISTORY:  Past Surgical History  Procedure Laterality Date  . Hemorrhoid surgery    . Ankle arthrodesis  1995    rt fx-hardware in  . Tonsillectomy    . Colonoscopy    . Excision / curettage bone cyst finger  2005    rt thumb  . Total shoulder arthroplasty  02/03/2012    Procedure: TOTAL SHOULDER ARTHROPLASTY;  Surgeon: [name], MD;  Location: [facility];  Service: Orthopedics;  Laterality: Left;  . Cardioversion N/A 09/29/2014    Procedure: CARDIOVERSION;  Surgeon: Josue Hector, MD;  Location: [facility] ENDOSCOPY;  Service: Cardiovascular;  Laterality: N/A;  . Percutaneous cholecytostomy tube      IVR insertion 06-13-15(Dr. Vernard Gambles)- 08-28-15 remains in place to drainage bag  . Ercp N/A 09/02/2015    Procedure: ENDOSCOPIC RETROGRADE CHOLANGIOPANCREATOGRAPHY (ERCP);  Surgeon: Arta Silence, MD;  Location: [facility] ENDOSCOPY;  Service: Endoscopy;  Laterality: N/A;  . Ercp N/A 09/07/2015    Procedure: ENDOSCOPIC RETROGRADE CHOLANGIOPANCREATOGRAPHY (ERCP);  Surgeon: Clarene Essex, MD;  Location: [facility] ENDOSCOPY;  Service: Endoscopy;  Laterality: N/A;  .  Cholecystectomy  09/08/2015     laproscopic   . Cholecystectomy N/A 09/08/2015    Procedure: LAPAROSCOPIC CHOLECYSTECTOMY WITH ATTEMPTED INTRAOPERATIVE CHOLANGIOGRAM ;  Surgeon: Fanny Skates, MD;  Location: [facility];  Service: General;  Laterality: N/A;    REVIEW OF SYSTEMS:  A comprehensive review of systems was negative except for: Neurological: positive for paresthesia   PHYSICAL EXAMINATION: General appearance: alert, cooperative, fatigued and no distress Head: Normocephalic, without obvious abnormality, atraumatic Neck: no adenopathy, no JVD, supple, symmetrical, trachea midline and thyroid not enlarged, symmetric, no tenderness/mass/nodules Lymph nodes: Cervical, supraclavicular, and axillary nodes normal. Resp: clear to auscultation bilaterally Back: symmetric, no curvature. ROM normal. No CVA tenderness. Cardio: regular rate and rhythm, S1, S2 normal, no murmur, click, rub or gallop GI: Right upper quadrant tenderness to palpation Extremities: extremities normal, atraumatic, no cyanosis or edema Neurologic: Alert and oriented X 3, normal strength and tone. Normal symmetric reflexes. Normal coordination and gait  ECOG PERFORMANCE STATUS: 1 - Symptomatic but completely ambulatory  There were no  vitals taken for this visit.  LABORATORY DATA: CBC Latest Ref Rng 11/18/2015 11/04/2015 10/21/2015  WBC 4.0 - 10.3 10e3/uL 4.2 4.6 5.6  Hemoglobin 13.0 - 17.1 g/dL 13.9 14.0 14.3  Hematocrit 38.4 - 49.9 % 39.7 39.9 40.3  Platelets 140 - 400 10e3/uL 241 231 243   CMP Latest Ref Rng 11/04/2015 10/21/2015 10/07/2015  Glucose 70 - 140 mg/dl 79 123 130  BUN 7.0 - 26.0 mg/dL 15.0 15.5 13.9  Creatinine 0.7 - 1.3 mg/dL 0.8 0.8 0.8  Sodium 136 - 145 mEq/L 136 134(L) 134(L)  Potassium 3.5 - 5.1 mEq/L 3.6 3.9 3.5  Chloride 101 - 111 mmol/L - - -  CO2 22 - 29 mEq/L _0 Calcium 8.4 - 10.4 mg/dL 9.6 9.3 9.9  Total Protein 6.4 - 8.3 g/dL 6.6 6.3(L) 6.5  Total Bilirubin 0.20 - 1.20 mg/dL 0.61 0.65 0.78  Alkaline Phos 40 - 150 U/L 106 101 91  AST 5 - 34 U/L _1 ALT 0 - 55 U/L _2 RADIOGRAPHIC STUDIES: Ct Chest W Contrast  11/09/2015  CLINICAL DATA:  Lung cancer diagnosis 10/2014. Chemotherapy and radiation therapy complete. Opdivo infusion in progress. EXAM: CT CHEST, ABDOMEN, AND PELVIS WITH CONTRAST TECHNIQUE: Multidetector CT imaging of the chest, abdomen and pelvis was performed following the standard protocol during bolus administration of intravenous contrast. CONTRAST:  134m OMNIPAQUE IOHEXOL 300 MG/ML  SOLN COMPARISON:  CT 08/03/1959 FINDINGS: CT CHEST FINDINGS Mediastinum/Nodes: No axillary or supraclavicular lymphadenopathy. No mediastinal hilar adenopathy. No pericardial fluid. Rounded lymph node in the prevascular space measuring 5 mm is unchanged. No central pulmonary embolism. Esophagus normal. Lungs/Pleura: Bilateral ground-glass nodules are unchanged. For example 3 nodules in the LEFT upper lobe (image 20 series 4). Angular nodular thickening in the more superior LEFT hepatic lobe is not changed measuring 13 mm compared 13 mm (image 14 series 4). No new pulmonary nodules. Musculoskeletal: No aggressive osseous lesion. Remote fracture RIGHT humerus. Asymmetric  gynecomastia on the RIGHT CT ABDOMEN AND PELVIS FINDINGS Hepatobiliary: No focal hepatic lesion. Postcholecystectomy. No biliary dilatation. Several low-density lesions in the liver are stable consistent small benign cysts. Pancreas: Pancreas is normal. No ductal dilatation. No pancreatic inflammation. Spleen: Normal spleen Adrenals/urinary tract: Adrenal glands and kidneys are normal. The ureters and bladder normal. Stomach/Bowel: Stomach, small bowel, appendix, and cecum are normal. The colon and rectosigmoid colon are normal. Vascular/Lymphatic: Abdominal aorta is normal caliber with  atherosclerotic calcification. There is no retroperitoneal or periportal lymphadenopathy. No pelvic lymphadenopathy. Reproductive: Prostate normal. Other: No peritoneal metastasis. Musculoskeletal: No aggressive osseous lesion. IMPRESSION: Chest Impression: 1. Stable bilateral ground-glass nodules in the lung parenchyma. 2. Stable nodule in the LEFT upper lobe. 3. No evidence of lung cancer recurrence. 4. No evidence of metastatic adenopathy. Abdomen / Pelvis Impression: 1. No evidence of metastatic disease in the abdomen or pelvis. 2. Atherosclerotic calcification aorta. 3. No skeletal metastasis. Electronically Signed   By: Suzy Bouchard M.D.   On: 11/09/2015 15:22   Mr Cervical Spine Wo Contrast  11/01/2015   [facility] [address] [place], [place] [address] [phone] NEUROIMAGING REPORT STUDY DATE: 10/31/2015 PATIENT NAME: Jeziel Hoffmann DOB: [birth year] MRN: [id number] EXAM: MRI of the cervical spine ORDERING CLINICIAN: Sarina Ill M.D. CLINICAL HISTORY: 74 year old man with a gait disturbance and loss of proprioception COMPARISON FILMS: MRI of the cervical spine 09/19/2007. TECHNIQUE: MRI of the cervical spine was obtained utilizing 3 mm sagittal slices from the posterior fossa down to the T3-4 level with T1, T2 and inversion recovery views. In addition 4 mm axial slices from X3-8 down to  T1-2 level were included with T2 and gradient echo views. CONTRAST: None IMAGING SITE: [facility],  [address] FINDINGS: :  On sagittal images, the spine is imaged from above the cervicomedullary junction to T3.   The spinal cord is of normal caliber and signal.   There is reversal of the normally lordotic curve of the cervical spine. There is severe loss of disc height noted at all cervical levels except C2-C3.    The discs and interspaces were further evaluated on axial views from C2 to T1 as follows: C2 - C3:  There is left uncovertebral spurring and left facet hypertrophy. The left neural foramen is moderately narrowed. There is no definite nerve root compression though there is some encroachment upon the exiting C3 nerve root on the left. C3 - C4:  There is moderate spinal stenosis with an AP diameter of 7.9 mm due mostly to endplate spurring and possibly congenital narrowing of the spinal canal.  There is moderately severe foraminal narrowing to compression of either of the C4 nerve roots.. C4 - C5:  There is borderline spinal stenosis due to endplate spurring and reduced disc height and mild facet hypertrophy. At this level, there is moderately severe foraminal narrowing bilaterally that could lead to compression of either of the C5 nerve roots.. C5 - C6:  There is borderline spinal stenosis due to endplate spurring, disc bulging and mild facet hypertrophy. At this level there is moderate bilateral foraminal narrowing. Although there is no definite nerve root compression there is encroachment upon both of the exiting C6 nerve roots. C6 - C7:  There is mild disc bulging and bilateral uncovertebral spurring. The neural foramina are moderately narrowed bilaterally. There is no definite nerve root compression though there is some encroachment upon both of the exiting C7 nerve roots.. C7 - T1:  There is left greater than right uncovertebral spurring and mild disc bulging. The neural foramina are  mildly narrowed and there does not appear to be any nerve root compression.. The study was compared to the MRI of the cervical spine 09/19/2007. In the interim, there is no significant change. The degree of spinal stenosis at C3-C4 and the degree of multilevel foraminal narrowing was similar in both studies.   11/01/2015   This is an abnormal MRI of the cervical spine  show the following: 1.  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 There is moderately severe foraminal narrowing bilaterally at this level that could lead to compression of either of the C4 nerve roots. 2.   Borderline spinal stenosis at C4-C5 and moderately severe foraminal narrowing bilaterally that could lead to compression of either of the C5 nerve root. 3.   Borderline spinal stenosis at C5-C6. There is moderate bilateral foraminal narrowing. There is no definite nerve root compression though there is encroachment upon both C6 nerve roots. 4.   Degenerative changes at C2-C3, C6-C7 and C7-T1 are unlikely to lead to nerve root compression 5.   The spinal cord appears normal. 6.   When compared to the MRI of the cervical spine 09/19/2007, there is no significant change. INTERPRETING PHYSICIAN: Richard A. Felecia Shelling, MD, PhD Certified in  Neuroimaging by [place] [employer]   Mr Thoracic Spine Wo Contrast  11/01/2015  [facility] [address], [place], [place] [address] [phone] NEUROIMAGING REPORT STUDY DATE: 10/31/2015 PATIENT NAME: Carthel Castille DOB: 1941/02/17 MRN: [id number] EXAM: MRI of the thoracic spine ORDERING CLINICIAN: Sarina Ill M.D. CLINICAL HISTORY: 74 year old man with a gait disturbance and loss of proprioception in the feet COMPARISON FILMS: None TECHNIQUE: MRI of the thoracic spine was obtained utilizing 3 mm sagittal slices from the posterior fossa from C7 to L1 level with T1, T2 and inversion recovery views. In addition 4 mm axial slices from E0C1 to K48J8 level  were included with T2 and gradient echo views. CONTRAST: none IMAGING SITE: [facility],  [place] FINDINGS: :  On scout images, the spine is imaged from above the cervicomedullary junction to the mid thoracic spine.   Dedicated thoracic images run from [facility].  The spinal cord is of normal caliber and signal.   There is mild scoliosis convex to the right. Moderate loss of disc height is noted at T1-T2, T2-T3, T3-T4 and T8-T9. Some edema is noted within the inferior endplate of the T9, H63, T11 and T12 vertebral bodies associated with Schmorl's nodes.   The discs and interspaces were further evaluated on axial views from T1 to L1.   At T1-T2, there is right greater than left uncovertebral spurring and mild disc bulging. There is no definite nerve root impingement. At T2-T3 there is minimal uncovertebral spurring and mild disc bulging, more to the left. There is no nerve root impingement. At T3-T4, there is uncovertebral spurring. There is no definite nerve root impingement. At T8-T9, there is broad disc bulging. There is no definite nerve root impingement. At T10-T11, there is right paramedian disc protrusion. There is right foraminal narrowing though there is no definite nerve root impingement.  At  T11-T12, there is mild disc bulging.   At T12-L1, there is mild disc bulging and facet hypertrophy. There is no nerve root impingement.[name]   11/01/2015   This is an abnormal MRI of the thoracic spine showed the following: 1.   There are Schmorl's nodes within the inferior endplates of T9, J49, F02 and T12. There is some edema in the inferior endplates of these vertebral bodies implying more recent onset. 2.   There are multilevel degenerative changes as detailed above. At T10-T11 there is a right paramedian disc protrusion causing right foraminal narrowing but no definite nerve root compression. There is less potential for nerve root impingement at the other levels. 3.   The spinal cord appears normal 4.    Mild  scoliosis convex to the right INTERPRETING PHYSICIAN: Richard A. Felecia Shelling, MD, PhD Certified in  Neuroimaging by [place] [employer]   Ct Abdomen Pelvis W Contrast  11/09/2015  CLINICAL DATA:  Lung cancer diagnosis 10/2014. Chemotherapy and radiation therapy complete. Opdivo infusion in progress. EXAM: CT CHEST, ABDOMEN, AND PELVIS WITH CONTRAST TECHNIQUE: Multidetector CT imaging of the chest, abdomen and pelvis was performed following the standard protocol during bolus administration of intravenous contrast. CONTRAST:  171m OMNIPAQUE IOHEXOL 300 MG/ML  SOLN COMPARISON:  CT 08/03/1959 FINDINGS: CT CHEST FINDINGS Mediastinum/Nodes: No axillary or supraclavicular lymphadenopathy. No mediastinal hilar adenopathy. No pericardial fluid. Rounded lymph node in the prevascular space measuring 5 mm is unchanged. No central pulmonary embolism. Esophagus normal. Lungs/Pleura: Bilateral ground-glass nodules are unchanged. For example 3 nodules in the LEFT upper lobe (image 20 series 4). Angular nodular thickening in the more superior LEFT hepatic lobe is not changed measuring 13 mm compared 13 mm (image 14 series 4). No new pulmonary nodules. Musculoskeletal: No aggressive osseous lesion. Remote fracture RIGHT humerus. Asymmetric gynecomastia on the RIGHT CT ABDOMEN AND PELVIS FINDINGS Hepatobiliary: No focal hepatic lesion. Postcholecystectomy. No biliary dilatation. Several low-density lesions in the liver are stable consistent small benign cysts. Pancreas: Pancreas is normal. No ductal dilatation. No pancreatic inflammation. Spleen: Normal spleen Adrenals/urinary tract: Adrenal glands and kidneys are normal. The ureters and bladder normal. Stomach/Bowel: Stomach, small bowel, appendix, and cecum are normal. The colon and rectosigmoid colon are normal. Vascular/Lymphatic: Abdominal aorta is normal caliber with atherosclerotic calcification. There is no retroperitoneal or periportal lymphadenopathy. No  pelvic lymphadenopathy. Reproductive: Prostate normal. Other: No peritoneal metastasis. Musculoskeletal: No aggressive osseous lesion. IMPRESSION: Chest Impression: 1. Stable bilateral ground-glass nodules in the lung parenchyma. 2. Stable nodule in the LEFT upper lobe. 3. No evidence of lung cancer recurrence. 4. No evidence of metastatic adenopathy. Abdomen / Pelvis Impression: 1. No evidence of metastatic disease in the abdomen or pelvis. 2. Atherosclerotic calcification aorta. 3. No skeletal metastasis. Electronically Signed   By: SSuzy BouchardM.D.   On: 11/09/2015 15:22   ASSESSMENT AND PLAN: This is a very pleasant 74years old white male with:  1)  Stage IV non-small cell lung cancer, adenocarcinoma status post induction chemotherapy with carboplatin and Alimta and currently on maintenance treatment with single agent Alimta status post 8 cycles. He has been observation for the last few months.  He had evidence for disease progression on the left adrenal gland and the patient underwent palliative radiotherapy to the left adrenal gland under the care of Dr. KSondra Comecompleted on 12/16/2014. He is currently undergoing treatment with immunotherapy with Nivolumab status post 12 cycles and tolerating his treatment fairly well.  he recovered well from the recent surgery. I recommended for the patient to resume his treatment with Nivolumab. He will start cycle #13 today. CT scan of the chest, abdomen and pelvis on 12/5 is negative for disease progression, no new metastases seen. He would come back for follow-up visit in 2 weeks for reevaluation with the start of cycle #14  2) Atrial fibrillation: He'll continue his current medication with Cardizem, digoxin and metoprolol in addition to Eliquis. He'll also continue his routine followup visit with his cardiologist Dr. ARayann Heman  He was advised to call immediately if he has any concerning symptoms in the interval. The patient voices understanding of  current disease status and treatment options and is in agreement with the current care plan.  All questions were answered. The patient knows to  call the clinic with any problems, questions or concerns. We can certainly see the patient much sooner if necessary.  ADDENDUM: Hematology/Oncology Attending: I had a face to face encounter with the patient. I recommended his care plan. This is a very pleasant 74 years old white male with a stage IV non-small cell lung cancer currently undergoing treatment with immunotherapy with Nivolumab status post 12 cycles. The patient has been tolerating his treatment fairly well with no significant adverse effects. His recent CT scan of the chest, abdomen and pelvis showed stable disease. I discussed the scan results with the patient today. I recommended for him to continue his treatment with Nivolumab as a scheduled. The patient would come back for follow-up visit in 2 weeks for reevaluation with the start of the next cycle of his treatment. He was advised to call immediately if he has any concerning symptoms in the interval.  Disclaimer: This note was dictated with voice recognition software. Similar sounding words can inadvertently be transcribed and may be missed upon review. Eilleen Kempf., MD 11/21/2015

## 2015-11-19 ENCOUNTER — Telehealth: Payer: Self-pay | Admitting: Neurology

## 2015-11-19 NOTE — Telephone Encounter (Signed)
Called pt back. He states he got a missed call on machine from someone to call about results. Asked him if it was our office and he said he just called the number back that was on the machine. Advised that Dr Ahern/myself did not call him about results. Went over EMG/NCS study results again w/ him per Dr Jaynee Eagles. Told him to call if he has any more questions. He verbalized understanding.

## 2015-11-19 NOTE — Telephone Encounter (Signed)
Patient is calling to get test results. 

## 2015-11-21 ENCOUNTER — Encounter: Payer: Self-pay | Admitting: Physician Assistant

## 2015-11-22 ENCOUNTER — Encounter: Payer: Self-pay | Admitting: Physical Therapy

## 2015-11-22 NOTE — Therapy (Signed)
[facility] [address] [place], [place], [address] Phone: [phone]   Fax:  [phone]  Physical Therapy Treatment  Patient Details  Name: Anthony Curry MRN: [id number] Date of Birth: Sep 23, 1941 Referring Provider: Dr. Sarina Ill  Encounter Date: 11/17/2015      PT End of Session - 11/22/15 2125    Visit Number 3   Number of Visits 9   Date for PT Re-Evaluation 12/03/15   Authorization Type Medicare   Authorization Time Period 11-03-15 - 01-02-16   PT Start Time 1446   PT Stop Time 1530   PT Time Calculation (min) 44 min      Past Medical History  Diagnosis Date  . Hypertension   . GERD (gastroesophageal reflux disease)   . Alcohol abuse   . 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 Low sodium   . Persistent atrial fibrillation ([place]) 04/11/14    chads2vasc score is 2  . Hypotension   . Atrial fibrillation with RVR ([place])     Dr. Allred,cardiology follows LOV 07-30-15  . HCAP (healthcare-associated pneumonia)   . 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 BPH (benign prostatic hyperplasia)   . Hyponatremia   . Anemia of chronic disease   . Fall   . Alcohol intoxication ([place])   . Pulmonary nodules   . Adrenal mass ([place])   . Atrial flutter by electrocardiogram ([place])   . 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 Alcoholism in remission ([place])   . Radiation 12/02/14-12/16/14    Left adrenal gland 30 Gy in 10 fractions  . Dysrhythmia     hx. atrial fibrillation-hx cardiversion- no recent issues- sees Dr. [name]  . Complication of anesthesia 09/08/2015     JITTERY AFTER GALLBLADDER SURGERY  . Anxiety   . Cancer ([place])     small cell/lung  . Neoplastic malignant related fatigue     IV tx. every 2 weeks last9-14-16 (Dr. Earlie Server), radiation last tx. 6 months    Past Surgical History  Procedure Laterality Date  . Hemorrhoid surgery    . Ankle arthrodesis  1995    rt fx-hardware in  .  Tonsillectomy    . Colonoscopy    . Excision / curettage bone cyst finger  2005    rt thumb  . Total shoulder arthroplasty  02/03/2012    Procedure: TOTAL SHOULDER ARTHROPLASTY;  Surgeon: [name], MD;  Location: [facility];  Service: Orthopedics;  Laterality: Left;  . Cardioversion N/A 09/29/2014    Procedure: CARDIOVERSION;  Surgeon: Josue Hector, MD;  Location: [facility] ENDOSCOPY;  Service: Cardiovascular;  Laterality: N/A;  . Percutaneous cholecytostomy tube      IVR insertion 06-13-15(Dr. Vernard Gambles)- 08-28-15 remains in place to drainage bag  . Ercp N/A 09/02/2015    Procedure: ENDOSCOPIC RETROGRADE CHOLANGIOPANCREATOGRAPHY (ERCP);  Surgeon: Arta Silence, MD;  Location: [facility] ENDOSCOPY;  Service: Endoscopy;  Laterality: N/A;  . Ercp N/A 09/07/2015    Procedure: ENDOSCOPIC RETROGRADE CHOLANGIOPANCREATOGRAPHY (ERCP);  Surgeon: Clarene Essex, MD;  Location: [facility] ENDOSCOPY;  Service: Endoscopy;  Laterality: N/A;  . Cholecystectomy  09/08/2015     laproscopic   . Cholecystectomy N/A 09/08/2015    Procedure: LAPAROSCOPIC CHOLECYSTECTOMY WITH ATTEMPTED INTRAOPERATIVE CHOLANGIOGRAM ;  Surgeon: Fanny Skates, MD;  Location: [facility];  Service: General;  Laterality: N/A;    There were no vitals filed for this visit.  Visit Diagnosis:  Abnormality of gait  Unsteadiness      Subjective Assessment - 11/22/15  2123    Subjective Pt states he has not done the exercises yet - will try to do them this week   Pertinent History lung cancer -diagnosed Nov. 2014; gall bladder removed in Oct. 2016; s/p L TSR 2 yrs ago   Patient Stated Goals Improve balance - be able to walk without use of walker (rollator) or cane   Currently in Pain? No/denies        NeuroRe-ed; Sensory Organization Test score - 18/100 with scores as follows;   Condition 1 - all 3 trials Normal   Condition 2 - all 3 trials FALL   Condition 3 - all 3 trials FALL   Condition 4 - all 3 below normal (red)    Condition 5 - all 3  trials FALL   Condition 6 - all 3 trials FALL   Discussed results of this test with pt; no somatosensory or vestibular input scored in balance test; visual input score 58/100 with N = 73/100  Educated pt in compensatory techniques for assistance with balance - including use of nightlight at night and use of flashlight or penlight when ambulating at night - Pt verbalized understanding of these techniques; also recommended sitting when showering/washing hair due to lack of vision to assist with balance                      PT Education - 11/22/15 2124    Education provided Yes   Education Details explained results of SOT to pt; also discussed compensatory techniques due to lack of vestibular input in maintaining balance   Person(s) Educated Patient   Methods Explanation;Demonstration;Handout  copy of SOT given   Comprehension Verbalized understanding             PT Long Term Goals - 11/08/15 2049    PT LONG TERM GOAL #1   Title Incr. gait velocity to >/= 3.1 ft/sec with use of cane for incr. gait efficiency.  12-05-15   Time 4   Period Weeks   Status New   PT LONG TERM GOAL #2   Title Incr. Berg balance test score to >/= 45/56 to reduce fall risk.  12-05-15   Baseline 40/56 on 11-03-15   Period Weeks   Status New   PT LONG TERM GOAL #3   Title Independent in HEP for balance exercises. 12-05-15   Time 4   Period Weeks   Status New   PT LONG TERM GOAL #4   Title Perform SOT and establish goal as appropriate.  12-05-15   Time 4   Period Weeks   Status New               Plan - 11/22/15 2126    Clinical Impression Statement Pt has severe deficits in somatosensory and vestibular inputs in maintaining balance per SOT; score 18 /100 with N= 68/100   Pt will benefit from skilled therapeutic intervention in order to improve on the following deficits Abnormal gait;Decreased balance;Dizziness;Impaired sensation;Decreased mobility;Decreased  coordination;Decreased strength   Rehab Potential Good   PT Frequency 2x / week   PT Duration 4 weeks   PT Treatment/Interventions ADLs/Self Care Home Management;Balance training;Therapeutic exercise;Therapeutic activities;Functional mobility training;Stair training;Gait training;Neuromuscular re-education;Patient/family education;Vestibular   PT Next Visit Plan cont balance exercises   PT Home Exercise Plan balance and vestibular exercises   Consulted and Agree with Plan of Care Patient        Problem List Patient Active Problem List   Diagnosis Date Noted  .  Neuropathy ([place]) 10/15/2015  . Acute urinary retention 09/09/2015  . Choledocholithiasis with cholecystitis 09/08/2015  . Common bile duct stone   . Encounter for antineoplastic immunotherapy 06/28/2015  . Atrial fibrillation ([place]) 06/11/2015  . Gynecomastia, male 06/10/2015  . Dizziness 05/11/2015  . Malignant neoplasm of lower lobe of right lung ([place]) 09/17/2014  . Neutropenic fever ([place]) 09/02/2014  . Neoplastic malignant related fatigue 08/13/2014  . Physical deconditioning 08/13/2014  . Hypoalbuminemia 08/13/2014  . Diarrhea 08/13/2014  . Antineoplastic chemotherapy induced anemia(285.3) 08/13/2014  . Atrial fibrillation with RVR ([place]) 07/28/2014  . HCAP (healthcare-associated pneumonia) 07/25/2014  . Alcoholism in remission ([place]) 07/25/2014  . Atrial flutter by electrocardiogram ([place]) 07/25/2014  . Swelling of limb 06/10/2014  . Lung cancer ([place]) 10/15/2013  . Hypotension 09/10/2013  . Adrenal mass ([place]) 09/09/2013  . 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 Pulmonary nodules 08/19/2013  . Anemia of chronic disease 07/03/2013  . BPH (benign prostatic hyperplasia) 07/03/2013  . GERD (gastroesophageal reflux disease) 07/03/2013  . Fall 07/03/2013  . Alcohol intoxication ([place]) 07/03/2013  . 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 Alcohol abuse 10/15/2011  . Hyponatremia 10/15/2011    [place]ness [place], PT 11/22/2015, 9:28 PM  [facility] [address] [place], [place], [address] Phone: [phone]   Fax:  [phone]  Name: Anthony Curry MRN: [id number] Date of Birth: [birth year]

## 2015-11-22 NOTE — Procedures (Signed)
  [facility]    Provider:  Dr Jaynee Eagles Referring Provider: [name], MD Primary Care Physician:  [name], MD   HPI: 74 y.o. male here as a referral from Dr. Alyson Ingles for balance difficulty, feeling of dizziness, proprioception difficulties. He has a PMHx of Stage IV non-small cell lung cancer, adenocarcinoma diagnosed in October of 2014 s/p systemic chemotherapy with carboplatin status post 6 cycles first dose on 10/23/2013, Maintenance systemic chemotherapy with single agent Alimta status post 8 cycles, Palliative radiotherapy to the left adrenal gland metastasis under the care of Dr. Sondra Come completed on 12/16/2014, currently on Nivolumab. His exam is significant for mixed (small and large) fiber neuropathy including absent proprioception and vibration at the distal lower extremities, +Romberg, decr pp and temp in a glove and stocking distribution. His significant large-fiber sensory loss and proprioceptive loss is likely the cause of his balance problems.  Summary  Nerve conduction studies were performed on the bilateral ower extremities:  The right Peroneal motor nerve showed decreased amplitude(96m, N>2) with delayed F Wave latency(524m N<56) The left Peroneal motor nerve showed decreased amplitude(0.8365mN>2) with normal F Wave latency The right Tibial motor nerve showed decreased amplitude(2.6mV66m>3) with normal F Wave latency The left Tibial motor nerve showed decreased amplitude(2mV,15m3) with normal F Wave latency The right Sural sensory nerve showed delayed distal peak latency (4.6ms, 81m.2) The left Sural sensory nerve showed delayed distal peak latency (4.5ms, N77m2) The bilateral radial nerves were within normal limits. Bilateral H Reflexes showed no response  EMG Needle study was performed on selected right lower extremity muscles:   The right Medial Gastrocnemius showed increased spontaneous activity (+2 positive waves), the right  Extensor Hallucis Longus showed increased spontaneous activity (+2 positive waves), increased motor unit amplitude, diminished motor unit recruitment and prolonged motor unit duration. The right Abductor Hallucis showed increased spontaneous activity (+3 positive waves), increased motor unit amplitude, diminished motor unit recruitment and prolonged motor unit duration. The right Vastus Medialis, right Anterior Tibialis muscles were within normal limits.   Conclusion:  There is electrophysiologic evidence for severe, axonal, length-dependent sensorimotor polyneuropathy.   AntoniaSarina Ill[facility]for[facility] [phone]

## 2015-11-23 ENCOUNTER — Telehealth: Payer: Self-pay | Admitting: Hematology

## 2015-11-23 ENCOUNTER — Ambulatory Visit: Payer: Medicare Other | Admitting: Physical Therapy

## 2015-11-23 ENCOUNTER — Telehealth: Payer: Self-pay | Admitting: Neurology

## 2015-11-23 NOTE — Telephone Encounter (Signed)
Called pt. Per Dr Jaynee Eagles, ok to mail office note if sufficient. Pt agreed and aware I will be mailing.

## 2015-11-23 NOTE — Telephone Encounter (Signed)
Patient called to request letter for V.A. Regarding diagnosis of neuropathy and 1 or 2 other things caused by chemotherapy for lung cancer. Also requests last couple of visits and what was done to go along with this for V.A.

## 2015-11-23 NOTE — Telephone Encounter (Signed)
sch pt appt-sent MAW email to sch trmt-pt to get updated copy 12/28 Dr. [name] patient not Burr Medico

## 2015-11-24 ENCOUNTER — Telehealth: Payer: Self-pay | Admitting: *Deleted

## 2015-11-24 NOTE — Telephone Encounter (Signed)
Per staff message and POF I have scheduled appts. Advised scheduler of appts. JMW  

## 2015-11-25 NOTE — Telephone Encounter (Addendum)
Pt called and says he did not receive notes for his 12/12 visit and would like a copy of notes to turn into [place]. Please call and advise [phone]

## 2015-11-25 NOTE — Telephone Encounter (Signed)
Called pt back. He would like results from 12/12 mailed as well. Told him I will forward these to him.

## 2015-11-26 ENCOUNTER — Ambulatory Visit: Payer: Medicare Other | Admitting: Physical Therapy

## 2015-12-02 ENCOUNTER — Encounter: Payer: Self-pay | Admitting: Internal Medicine

## 2015-12-02 ENCOUNTER — Ambulatory Visit (HOSPITAL_BASED_OUTPATIENT_CLINIC_OR_DEPARTMENT_OTHER): Payer: Medicare Other | Admitting: Internal Medicine

## 2015-12-02 ENCOUNTER — Telehealth: Payer: Self-pay | Admitting: Internal Medicine

## 2015-12-02 ENCOUNTER — Other Ambulatory Visit: Payer: Self-pay

## 2015-12-02 ENCOUNTER — Other Ambulatory Visit (HOSPITAL_BASED_OUTPATIENT_CLINIC_OR_DEPARTMENT_OTHER): Payer: Medicare Other

## 2015-12-02 ENCOUNTER — Ambulatory Visit (HOSPITAL_BASED_OUTPATIENT_CLINIC_OR_DEPARTMENT_OTHER): Payer: Medicare Other

## 2015-12-02 VITALS — BP 128/68 | HR 88 | Temp 97.9°F | Resp 18 | Ht 71.0 in | Wt 155.9 lb

## 2015-12-02 DIAGNOSIS — C3431 Malignant neoplasm of lower lobe, right bronchus or lung: Secondary | ICD-10-CM

## 2015-12-02 DIAGNOSIS — Z5112 Encounter for antineoplastic immunotherapy: Secondary | ICD-10-CM

## 2015-12-02 DIAGNOSIS — C3490 Malignant neoplasm of unspecified part of unspecified bronchus or lung: Secondary | ICD-10-CM

## 2015-12-02 DIAGNOSIS — R530 Neoplastic (malignant) related fatigue: Secondary | ICD-10-CM | POA: Diagnosis not present

## 2015-12-02 LAB — CBC WITH DIFFERENTIAL/PLATELET
BASO%: 0.6 % (ref 0.0–2.0)
BASOS ABS: 0 10*3/uL (ref 0.0–0.1)
EOS%: 5.7 % (ref 0.0–7.0)
Eosinophils Absolute: 0.3 10*3/uL (ref 0.0–0.5)
HEMATOCRIT: 43.1 % (ref 38.4–49.9)
HGB: 14.6 g/dL (ref 13.0–17.1)
LYMPH#: 1.4 10*3/uL (ref 0.9–3.3)
LYMPH%: 27.3 % (ref 14.0–49.0)
MCH: 31.4 pg (ref 27.2–33.4)
MCHC: 34 g/dL (ref 32.0–36.0)
MCV: 92.5 fL (ref 79.3–98.0)
MONO#: 0.5 10*3/uL (ref 0.1–0.9)
MONO%: 10.2 % (ref 0.0–14.0)
NEUT#: 3 10*3/uL (ref 1.5–6.5)
NEUT%: 56.2 % (ref 39.0–75.0)
PLATELETS: 244 10*3/uL (ref 140–400)
RBC: 4.66 10*6/uL (ref 4.20–5.82)
RDW: 14.4 % (ref 11.0–14.6)
WBC: 5.3 10*3/uL (ref 4.0–10.3)

## 2015-12-02 LAB — COMPREHENSIVE METABOLIC PANEL
ALT: 16 U/L (ref 0–55)
ANION GAP: 11 meq/L (ref 3–11)
AST: 22 U/L (ref 5–34)
Albumin: 3.8 g/dL (ref 3.5–5.0)
Alkaline Phosphatase: 113 U/L (ref 40–150)
BILIRUBIN TOTAL: 0.75 mg/dL (ref 0.20–1.20)
BUN: 16.4 mg/dL (ref 7.0–26.0)
CALCIUM: 9.2 mg/dL (ref 8.4–10.4)
CO2: 23 meq/L (ref 22–29)
CREATININE: 0.8 mg/dL (ref 0.7–1.3)
Chloride: 101 mEq/L (ref 98–109)
EGFR: 86 mL/min/{1.73_m2} — ABNORMAL LOW (ref >90)
Glucose: 116 mg/dl (ref 70–140)
Potassium: 3.8 mEq/L (ref 3.5–5.1)
Sodium: 135 mEq/L — ABNORMAL LOW (ref 136–145)
TOTAL PROTEIN: 6.5 g/dL (ref 6.4–8.3)

## 2015-12-02 MED ORDER — SODIUM CHLORIDE 0.9 % IV SOLN
240.0000 mg | Freq: Once | INTRAVENOUS | Status: AC
  Administered 2015-12-02: 240 mg via INTRAVENOUS
  Filled 2015-12-02: qty 24

## 2015-12-02 MED ORDER — SODIUM CHLORIDE 0.9 % IV SOLN
Freq: Once | INTRAVENOUS | Status: AC
  Administered 2015-12-02: 12:00:00 via INTRAVENOUS

## 2015-12-02 NOTE — Patient Instructions (Signed)
[facility] Discharge Instructions   Today you received the following: Nivolumab  To help prevent nausea and vomiting after your treatment, we encourage you to take your nausea medication as directed.    If you develop nausea and vomiting that is not controlled by your nausea medication, call the clinic.   BELOW ARE SYMPTOMS THAT SHOULD BE REPORTED IMMEDIATELY:  *FEVER GREATER THAN 100.5 F  *CHILLS WITH OR WITHOUT FEVER  NAUSEA AND VOMITING THAT IS NOT CONTROLLED WITH YOUR NAUSEA MEDICATION  *UNUSUAL SHORTNESS OF BREATH  *UNUSUAL BRUISING OR BLEEDING  TENDERNESS IN MOUTH AND THROAT WITH OR WITHOUT PRESENCE OF ULCERS  *URINARY PROBLEMS  *BOWEL PROBLEMS  UNUSUAL RASH Items with * indicate a potential emergency and should be followed up as soon as possible.  Feel free to call the clinic you have any questions or concerns. The clinic phone number is [phone].  Please show the CHEMO ALERT CARD at check-in to the Emergency Department and triage nurse.   

## 2015-12-02 NOTE — Telephone Encounter (Signed)
Per 12/28 pof, pt needs appt in 2wks then 1 month with chemo. These appts are already scheduled. Gave pt appts for 1/11 + 1/25.

## 2015-12-02 NOTE — Progress Notes (Signed)
[place] [phone]   [phone] [place], [place] [address]  DIAGNOSIS: Stage IV (T1 A., N0, M1 B.) non-small cell lung cancer, adenocarcinoma with negative EGFR mutation and negative ALK gene translocation diagnosed in October of 2014.   Molecular profile: Negative for RET, ALK, BRAF, MET, EGFR.  Positive for ERBB2 A775T, RCV8L381O, KRAS G13E, MAP2K1 D67N (see full report).   PRIOR THERAPY:  1) Systemic chemotherapy with carboplatin for AUC of 5 and Alimta 500 mg/M2 every 3 weeks, status post 6 cycles with stable disease. First dose on 10/23/2013. 2) Maintenance systemic chemotherapy with single agent Alimta 500 mg/M2 every 3 weeks. Status post 8 cycles.   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 Sondra Come completed on 12/16/2014.  CURRENT THERAPY: Immunotherapy with Nivolumab 3 MG/KG every 2 weeks, status post 19 cycles. First cycle 01/28/2015  CHEMOTHERAPY INTENT: Palliative  CURRENT # OF CHEMOTHERAPY CYCLES: 20 CURRENT ANTIEMETICS: Zofran, Compazine and dexamethasone  CURRENT SMOKING STATUS: Former smoker  ORAL CHEMOTHERAPY AND CONSENT: None  CURRENT BISPHOSPHONATES USE: None  PAIN MANAGEMENT: 0/10  NARCOTICS INDUCED CONSTIPATION: None  LIVING WILL AND CODE STATUS: ?   INTERVAL HISTORY: Anthony Curry 74 y.o. male returns to the clinic today for followup visit accompanied by his wife. The patient is feeling fine today with no specific complaints. He has been tolerating his treatment fairly well with no significant adverse effects. He has no fever or chills. He denied having any significant chest pain, shortness of breath, cough or hemoptysis. He denied having any nausea or vomiting. No significant weight loss or night sweats. He has no skin rash or diarrhea. He is here today to start cycle #20 of his  immunotherapy.  MEDICAL HISTORY: Past Medical History  Diagnosis Date  . Hypertension   . GERD (gastroesophageal reflux disease)   . Alcohol abuse   . 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 Low sodium   . Persistent atrial fibrillation ([place]) 04/11/14    chads2vasc score is 2  . Hypotension   . Atrial fibrillation with RVR ([place])     Dr. Allred,cardiology follows LOV 07-30-15  . HCAP (healthcare-associated pneumonia)   . 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 BPH (benign prostatic hyperplasia)   . Hyponatremia   . Anemia of chronic disease   . Fall   . Alcohol intoxication ([place])   . Pulmonary nodules   . Adrenal mass ([place])   . Atrial flutter by electrocardiogram ([place])   . 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 Alcoholism in remission ([place])   . Radiation 12/02/14-12/16/14    Left adrenal gland 30 Gy in 10 fractions  . Dysrhythmia     hx. atrial fibrillation-hx cardiversion- no recent issues- sees Dr. [name]  . Complication of anesthesia 09/08/2015     JITTERY AFTER GALLBLADDER SURGERY  . Anxiety   . Cancer ([place])     small cell/lung  . Neoplastic malignant related fatigue     IV tx. every 2 weeks last9-14-16 (Dr. Earlie Server), radiation last tx. 6 months    ALLERGIES:  has No Known Allergies.  MEDICATIONS:  Current Outpatient Prescriptions  Medication Sig Dispense Refill  . ALPRAZolam (XANAX) 0.25 MG tablet Take 0.25 mg by mouth 3 (three) times daily as needed for anxiety or sleep. And also at night for sleep.    [name] apixaban (ELIQUIS) 5 MG TABS  tablet Take 5 mg by mouth 2 (two) times daily.    . Ascorbic Acid (VITAMIN C PO) Take 1,000 mg by mouth daily.     . B Complex-C (SUPER B COMPLEX PO) Take 1 each by mouth daily.     . calcium carbonate (OS-CAL) 600 MG TABS Take 600 mg by mouth daily with breakfast.    . demeclocycline (DECLOMYCIN) 150 MG tablet Take 150 mg by mouth 2 (two) times daily.    [name] diltiazem (CARDIZEM CD) 180 MG 24 hr capsule Take 1 capsule (180 mg  total) by mouth daily at 12 noon. 90 capsule 3  . furosemide (LASIX) 40 MG tablet Take 0.5 tablets (20 mg total) by mouth daily. (Patient taking differently: Take 20 mg by mouth every other day. ) 90 tablet 3  . HYDROcodone-acetaminophen (NORCO/VICODIN) 5-325 MG tablet Take 1-2 tablets by mouth every 4 (four) hours as needed for moderate pain. 30 tablet 0  . KLOR-CON M20 20 MEQ tablet Take 20 mEq by mouth every morning.     . Lactobacillus (ACIDOPHILUS) CAPS capsule Take 1 capsule by mouth daily.    [name] MAGNESIUM PO Take 400 mg by mouth daily.     . mirtazapine (REMERON) 15 MG tablet Take 1 tablet (15 mg total) by mouth at bedtime. (Patient taking differently: Take 15 mg by mouth at bedtime as needed (sleep). ) 30 tablet 0  . Multiple Vitamin (MULTIVITAMIN WITH MINERALS) TABS Take 1 tablet by mouth daily.    . Nivolumab (OPDIVO IV) Inject as directed every 2 weeks    . Nutritional Supplements (ENSURE COMPLETE PO) Take 1 Can by mouth 2 (two) times daily.    . Omega-3 Fatty Acids (FISH OIL PO) Take 1 tablet by mouth daily.    . pantoprazole (PROTONIX) 40 MG tablet Take 40 mg by mouth every morning.  8  . tamsulosin (FLOMAX) 0.4 MG CAPS capsule Take 0.4 mg by mouth daily at 12 noon.     . thiamine (VITAMIN B-1) 100 MG tablet Take 1 tablet (100 mg total) by mouth daily. 30 tablet 6  . vitamin E 400 UNIT capsule Take 400 Units by mouth daily.     No current facility-administered medications for this visit.    SURGICAL HISTORY:  Past Surgical History  Procedure Laterality Date  . Hemorrhoid surgery    . Ankle arthrodesis  1995    rt fx-hardware in  . Tonsillectomy    . Colonoscopy    . Excision / curettage bone cyst finger  2005    rt thumb  . Total shoulder arthroplasty  02/03/2012    Procedure: TOTAL SHOULDER ARTHROPLASTY;  Surgeon: [name], MD;  Location: [facility];  Service: Orthopedics;  Laterality: Left;  . Cardioversion N/A 09/29/2014    Procedure: CARDIOVERSION;   Surgeon: Josue Hector, MD;  Location[facility] ENDOSCOPY;  Service: Cardiovascular;  Laterality: N/A;  . Percutaneous cholecytostomy tube      IVR insertion 06-13-15(Dr. Vernard Gambles)- 08-28-15 remains in place to drainage bag  . Ercp N/A 09/02/2015    Procedure: ENDOSCOPIC RETROGRADE CHOLANGIOPANCREATOGRAPHY (ERCP);  Surgeon: Arta Silence, MD;  Location: [facility] ENDOSCOPY;  Service: Endoscopy;  Laterality: N/A;  . Ercp N/A 09/07/2015    Procedure: ENDOSCOPIC RETROGRADE CHOLANGIOPANCREATOGRAPHY (ERCP);  Surgeon: Clarene Essex, MD;  Location: [facility] ENDOSCOPY;  Service: Endoscopy;  Laterality: N/A;  . Cholecystectomy  09/08/2015     laproscopic   . Cholecystectomy N/A 09/08/2015    Procedure: LAPAROSCOPIC CHOLECYSTECTOMY WITH ATTEMPTED INTRAOPERATIVE  CHOLANGIOGRAM ;  Surgeon: Fanny Skates, MD;  Location: [facility];  Service: General;  Laterality: N/A;    REVIEW OF SYSTEMS:  A comprehensive review of systems was negative except for: Neurological: positive for paresthesia   PHYSICAL EXAMINATION: General appearance: alert, cooperative, fatigued and no distress Head: Normocephalic, without obvious abnormality, atraumatic Neck: no adenopathy, no JVD, supple, symmetrical, trachea midline and thyroid not enlarged, symmetric, no tenderness/mass/nodules Lymph nodes: Cervical, supraclavicular, and axillary nodes normal. Resp: clear to auscultation bilaterally Back: symmetric, no curvature. ROM normal. No CVA tenderness. Cardio: regular rate and rhythm, S1, S2 normal, no murmur, click, rub or gallop GI: Right upper quadrant tenderness to palpation Extremities: extremities normal, atraumatic, no cyanosis or edema Neurologic: Alert and oriented X 3, normal strength and tone. Normal symmetric reflexes. Normal coordination and gait  ECOG PERFORMANCE STATUS: 1 - Symptomatic but completely ambulatory  Blood pressure 128/68, pulse 88, temperature 97.9 F (36.6 C), resp. rate 18, height 5' 11"  (1.803 m), weight 155 lb 14.4 oz (70.716  kg), SpO2 99 %.  LABORATORY DATA: Lab Results  Component Value Date   WBC 5.3 12/02/2015   HGB 14.6 12/02/2015   HCT 43.1 12/02/2015   MCV 92.5 12/02/2015   PLT 244 12/02/2015      Chemistry      Component Value Date/Time   NA 135* 12/02/2015 1046   NA 130* 09/09/2015 0530   K 3.8 12/02/2015 1046   K 3.8 09/09/2015 0530   CL 97* 09/09/2015 0530   CO2 23 12/02/2015 1046   CO2 26 09/09/2015 0530   BUN 16.4 12/02/2015 1046   BUN 7 09/09/2015 0530   CREATININE 0.8 12/02/2015 1046   CREATININE 0.76 09/09/2015 0530      Component Value Date/Time   CALCIUM 9.2 12/02/2015 1046   CALCIUM 8.2* 09/09/2015 0530   ALKPHOS 113 12/02/2015 1046   ALKPHOS 74 09/09/2015 0530   AST 22 12/02/2015 1046   AST 41 09/09/2015 0530   ALT 16 12/02/2015 1046   ALT 23 09/09/2015 0530   BILITOT 0.75 12/02/2015 1046   BILITOT 0.6 09/09/2015 0530       RADIOGRAPHIC STUDIES: Ct Chest W Contrast  11/09/2015  CLINICAL DATA:  Lung cancer diagnosis 10/2014. Chemotherapy and radiation therapy complete. Opdivo infusion in progress. EXAM: CT CHEST, ABDOMEN, AND PELVIS WITH CONTRAST TECHNIQUE: Multidetector CT imaging of the chest, abdomen and pelvis was performed following the standard protocol during bolus administration of intravenous contrast. CONTRAST:  169m OMNIPAQUE IOHEXOL 300 MG/ML  SOLN COMPARISON:  CT 08/03/1959 FINDINGS: CT CHEST FINDINGS Mediastinum/Nodes: No axillary or supraclavicular lymphadenopathy. No mediastinal hilar adenopathy. No pericardial fluid. Rounded lymph node in the prevascular space measuring 5 mm is unchanged. No central pulmonary embolism. Esophagus normal. Lungs/Pleura: Bilateral ground-glass nodules are unchanged. For example 3 nodules in the LEFT upper lobe (image 20 series 4). Angular nodular thickening in the more superior LEFT hepatic lobe is not changed measuring 13 mm compared 13 mm (image 14 series 4). No new pulmonary nodules. Musculoskeletal: No aggressive osseous  lesion. Remote fracture RIGHT humerus. Asymmetric gynecomastia on the RIGHT CT ABDOMEN AND PELVIS FINDINGS Hepatobiliary: No focal hepatic lesion. Postcholecystectomy. No biliary dilatation. Several low-density lesions in the liver are stable consistent small benign cysts. Pancreas: Pancreas is normal. No ductal dilatation. No pancreatic inflammation. Spleen: Normal spleen Adrenals/urinary tract: Adrenal glands and kidneys are normal. The ureters and bladder normal. Stomach/Bowel: Stomach, small bowel, appendix, and cecum are normal. The colon and rectosigmoid colon are normal. Vascular/Lymphatic:  Abdominal aorta is normal caliber with atherosclerotic calcification. There is no retroperitoneal or periportal lymphadenopathy. No pelvic lymphadenopathy. Reproductive: Prostate normal. Other: No peritoneal metastasis. Musculoskeletal: No aggressive osseous lesion. IMPRESSION: Chest Impression: 1. Stable bilateral ground-glass nodules in the lung parenchyma. 2. Stable nodule in the LEFT upper lobe. 3. No evidence of lung cancer recurrence. 4. No evidence of metastatic adenopathy. Abdomen / Pelvis Impression: 1. No evidence of metastatic disease in the abdomen or pelvis. 2. Atherosclerotic calcification aorta. 3. No skeletal metastasis. Electronically Signed   By: Suzy Bouchard M.D.   On: 11/09/2015 15:22   Ct Abdomen Pelvis W Contrast  11/09/2015  CLINICAL DATA:  Lung cancer diagnosis 10/2014. Chemotherapy and radiation therapy complete. Opdivo infusion in progress. EXAM: CT CHEST, ABDOMEN, AND PELVIS WITH CONTRAST TECHNIQUE: Multidetector CT imaging of the chest, abdomen and pelvis was performed following the standard protocol during bolus administration of intravenous contrast. CONTRAST:  190m OMNIPAQUE IOHEXOL 300 MG/ML  SOLN COMPARISON:  CT 08/03/1959 FINDINGS: CT CHEST FINDINGS Mediastinum/Nodes: No axillary or supraclavicular lymphadenopathy. No mediastinal hilar adenopathy. No pericardial fluid. Rounded  lymph node in the prevascular space measuring 5 mm is unchanged. No central pulmonary embolism. Esophagus normal. Lungs/Pleura: Bilateral ground-glass nodules are unchanged. For example 3 nodules in the LEFT upper lobe (image 20 series 4). Angular nodular thickening in the more superior LEFT hepatic lobe is not changed measuring 13 mm compared 13 mm (image 14 series 4). No new pulmonary nodules. Musculoskeletal: No aggressive osseous lesion. Remote fracture RIGHT humerus. Asymmetric gynecomastia on the RIGHT CT ABDOMEN AND PELVIS FINDINGS Hepatobiliary: No focal hepatic lesion. Postcholecystectomy. No biliary dilatation. Several low-density lesions in the liver are stable consistent small benign cysts. Pancreas: Pancreas is normal. No ductal dilatation. No pancreatic inflammation. Spleen: Normal spleen Adrenals/urinary tract: Adrenal glands and kidneys are normal. The ureters and bladder normal. Stomach/Bowel: Stomach, small bowel, appendix, and cecum are normal. The colon and rectosigmoid colon are normal. Vascular/Lymphatic: Abdominal aorta is normal caliber with atherosclerotic calcification. There is no retroperitoneal or periportal lymphadenopathy. No pelvic lymphadenopathy. Reproductive: Prostate normal. Other: No peritoneal metastasis. Musculoskeletal: No aggressive osseous lesion. IMPRESSION: Chest Impression: 1. Stable bilateral ground-glass nodules in the lung parenchyma. 2. Stable nodule in the LEFT upper lobe. 3. No evidence of lung cancer recurrence. 4. No evidence of metastatic adenopathy. Abdomen / Pelvis Impression: 1. No evidence of metastatic disease in the abdomen or pelvis. 2. Atherosclerotic calcification aorta. 3. No skeletal metastasis. Electronically Signed   By: SSuzy BouchardM.D.   On: 11/09/2015 15:22   ASSESSMENT AND PLAN: This is a very pleasant 74years old white male with:  1)  Stage IV non-small cell lung cancer, adenocarcinoma status post induction chemotherapy with  carboplatin and Alimta and currently on maintenance treatment with single agent Alimta status post 8 cycles. He has been observation for the last few months.  He had evidence for disease progression on the left adrenal gland and the patient underwent palliative radiotherapy to the left adrenal gland under the care of Dr. KSondra Comecompleted on 12/16/2014. He is currently undergoing treatment with immunotherapy with Nivolumab status post 19 cycles and tolerating his treatment fairly well.  he recovered well from the recent surgery. I recommended for the patient to resume his treatment with Nivolumab. He will start cycle #20 today. He would come back for follow-up visit in 2 weeks for reevaluation with the start of cycle #21.  2) Atrial fibrillation: He'll continue his current medication with Cardizem, digoxin and metoprolol in  addition to Eliquis. He'll also continue his routine followup visit with his cardiologist Dr. [name].  He was advised to call immediately if he has any concerning symptoms in the interval. The patient voices understanding of current disease status and treatment options and is in agreement with the current care plan.  All questions were answered. The patient knows to call the clinic with any problems, questions or concerns. We can certainly see the patient much sooner if necessary.  Disclaimer: This note was dictated with voice recognition software. Similar sounding words can inadvertently be transcribed and may not be corrected upon review.

## 2015-12-08 ENCOUNTER — Ambulatory Visit: Payer: Medicare Other | Attending: Neurology | Admitting: Physical Therapy

## 2015-12-08 DIAGNOSIS — R2681 Unsteadiness on feet: Secondary | ICD-10-CM | POA: Insufficient documentation

## 2015-12-08 DIAGNOSIS — R269 Unspecified abnormalities of gait and mobility: Secondary | ICD-10-CM | POA: Insufficient documentation

## 2015-12-08 NOTE — Therapy (Signed)
[facility] [address] [place], [place], [address] Phone: [phone]   Fax:  [phone]  Physical Therapy Treatment  Patient Details  Name: Anthony Curry MRN: [id number] Date of Birth: Apr 20, 1941 Referring Provider: Dr. Sarina Ill  Encounter Date: 12/08/2015      PT End of Session - 12/08/15 1451    Visit Number 4   Number of Visits 9   Date for PT Re-Evaluation 12/03/15   Authorization Type Medicare   Authorization Time Period 11-03-15 - 01-02-16   PT Start Time 1407   PT Stop Time 1448   PT Time Calculation (min) 41 min   Equipment Utilized During Treatment Gait belt   Activity Tolerance Patient tolerated treatment well   Behavior During Therapy [facility] for tasks assessed/performed      Past Medical History  Diagnosis Date  . Hypertension   . GERD (gastroesophageal reflux disease)   . Alcohol abuse   . 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 Low sodium   . Persistent atrial fibrillation ([place]) 04/11/14    chads2vasc score is 2  . Hypotension   . Atrial fibrillation with RVR ([place])     Dr. Allred,cardiology follows LOV 07-30-15  . HCAP (healthcare-associated pneumonia)   . 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 BPH (benign prostatic hyperplasia)   . Hyponatremia   . Anemia of chronic disease   . Fall   . Alcohol intoxication ([place])   . Pulmonary nodules   . Adrenal mass ([place])   . Atrial flutter by electrocardiogram ([place])   . 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 Alcoholism in remission ([place])   . Radiation 12/02/14-12/16/14    Left adrenal gland 30 Gy in 10 fractions  . Dysrhythmia     hx. atrial fibrillation-hx cardiversion- no recent issues- sees Dr. [name]  . Complication of anesthesia 09/08/2015     JITTERY AFTER GALLBLADDER SURGERY  . Anxiety   . Cancer ([place])     small cell/lung  . Neoplastic malignant related fatigue     IV tx. every 2 weeks last9-14-16 (Dr. Earlie Server), radiation  last tx. 6 months    Past Surgical History  Procedure Laterality Date  . Hemorrhoid surgery    . Ankle arthrodesis  1995    rt fx-hardware in  . Tonsillectomy    . Colonoscopy    . Excision / curettage bone cyst finger  2005    rt thumb  . Total shoulder arthroplasty  02/03/2012    Procedure: TOTAL SHOULDER ARTHROPLASTY;  Surgeon: [name], MD;  Location: [facility];  Service: Orthopedics;  Laterality: Left;  . Cardioversion N/A 09/29/2014    Procedure: CARDIOVERSION;  Surgeon: Josue Hector, MD;  Location: [facility] ENDOSCOPY;  Service: Cardiovascular;  Laterality: N/A;  . Percutaneous cholecytostomy tube      IVR insertion 06-13-15(Dr. Vernard Gambles)- 08-28-15 remains in place to drainage bag  . Ercp N/A 09/02/2015    Procedure: ENDOSCOPIC RETROGRADE CHOLANGIOPANCREATOGRAPHY (ERCP);  Surgeon: Arta Silence, MD;  Location: [facility] ENDOSCOPY;  Service: Endoscopy;  Laterality: N/A;  . Ercp N/A 09/07/2015    Procedure: ENDOSCOPIC RETROGRADE CHOLANGIOPANCREATOGRAPHY (ERCP);  Surgeon: Clarene Essex, MD;  Location: [facility] ENDOSCOPY;  Service: Endoscopy;  Laterality: N/A;  . Cholecystectomy  09/08/2015     laproscopic   . Cholecystectomy N/A 09/08/2015    Procedure: LAPAROSCOPIC CHOLECYSTECTOMY WITH ATTEMPTED INTRAOPERATIVE CHOLANGIOGRAM ;  Surgeon: Fanny Skates, MD;  Location: [facility];  Service: General;  Laterality: N/A;    There  were no vitals filed for this visit.  Visit Diagnosis:  Abnormality of gait      Subjective Assessment - 12/08/15 1414    Subjective Lost his exercises over the holidays.  "Does this get worse?"  feels like balance is worse.   Pertinent History lung cancer -diagnosed Nov. 2014; gall bladder removed in Oct. 2016; s/p L TSR 2 yrs ago   Patient Stated Goals Improve balance - be able to walk without use of walker (rollator) or cane   Currently in Pain? No/denies             [name] Adult PT Treatment/Exercise - 12/08/15 0001    Knee/Hip Exercises: Aerobic    Stationary Bike Scifit level 2.0 LE's only x 10 minutes for strengthening          Balance Exercises - 12/08/15 1458    Balance Exercises: Standing   Standing Eyes Opened Narrow base of support (BOS);Wide (BOA);Head turns;Foam/compliant surface;Solid surface;Other reps (comment)  10 reps   Standing Eyes Closed Wide (BOA);Narrow base of support (BOS);Solid surface;Other reps (comment)  10 reps   Tandem Stance Eyes open;Upper extremity support 2;3 reps;10 secs   SLS Eyes open;Solid surface;Upper extremity support 2;3 reps;10 secs   Tandem Gait Forward;Upper extremity support;5 reps;Other (comment)  8'   Sidestepping Upper extremity support;Other (comment)  8' x 4 reps           PT Education - 12/08/15 1451    Education provided Yes   Education Details HEP   Person(s) Educated Patient   Methods Explanation;Demonstration;Handout   Comprehension Verbalized understanding             PT Long Term Goals - 11/08/15 2049    PT LONG TERM GOAL #1   Title Incr. gait velocity to >/= 3.1 ft/sec with use of cane for incr. gait efficiency.  12-05-15   Time 4   Period Weeks   Status New   PT LONG TERM GOAL #2   Title Incr. Berg balance test score to >/= 45/56 to reduce fall risk.  12-05-15   Baseline 40/56 on 11-03-15   Period Weeks   Status New   PT LONG TERM GOAL #3   Title Independent in HEP for balance exercises. 12-05-15   Time 4   Period Weeks   Status New   PT LONG TERM GOAL #4   Title Perform SOT and establish goal as appropriate.  12-05-15   Time 4   Period Weeks   Status New               Plan - 12/08/15 1452    Clinical Impression Statement Pt states that he feels like his balance is getting worse.  Continues to receive chemotherapy but states this is a new chemo and MD does not think it is causing neuropathy.  Continue PT per POC.   Pt will benefit from skilled therapeutic intervention in order to improve on the following deficits Abnormal  gait;Decreased balance;Dizziness;Impaired sensation;Decreased mobility;Decreased coordination;Decreased strength   Rehab Potential Good   PT Frequency 2x / week   PT Duration 4 weeks   PT Treatment/Interventions ADLs/Self Care Home Management;Balance training;Therapeutic exercise;Therapeutic activities;Functional mobility training;Stair training;Gait training;Neuromuscular re-education;Patient/family education;Vestibular   PT Next Visit Plan review HEP and progress.  Corner balance exercises.   PT Home Exercise Plan balance and vestibular exercises   Consulted and Agree with Plan of Care Patient        Problem List Patient Active Problem List   Diagnosis  Date Noted  . Neuropathy ([place]) 10/15/2015  . Acute urinary retention 09/09/2015  . Choledocholithiasis with cholecystitis 09/08/2015  . Common bile duct stone   . Encounter for antineoplastic immunotherapy 06/28/2015  . Atrial fibrillation ([place]) 06/11/2015  . Gynecomastia, male 06/10/2015  . Dizziness 05/11/2015  . Malignant neoplasm of lower lobe of right lung ([place]) 09/17/2014  . Neutropenic fever ([place]) 09/02/2014  . Neoplastic malignant related fatigue 08/13/2014  . Physical deconditioning 08/13/2014  . Hypoalbuminemia 08/13/2014  . Diarrhea 08/13/2014  . Antineoplastic chemotherapy induced anemia(285.3) 08/13/2014  . Atrial fibrillation with RVR ([place]) 07/28/2014  . HCAP (healthcare-associated pneumonia) 07/25/2014  . Alcoholism in remission ([place]) 07/25/2014  . Atrial flutter by electrocardiogram ([place]) 07/25/2014  . Swelling of limb 06/10/2014  . Lung cancer ([place]) 10/15/2013  . Hypotension 09/10/2013  . Adrenal mass ([place]) 09/09/2013  . 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 Pulmonary nodules 08/19/2013  . Anemia of chronic disease 07/03/2013  . BPH (benign prostatic hyperplasia) 07/03/2013  . GERD (gastroesophageal reflux disease) 07/03/2013  . Fall 07/03/2013  . Alcohol intoxication ([place]) 07/03/2013  . 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 Alcohol abuse 10/15/2011  . Hyponatremia 10/15/2011    Narda Bonds 12/08/2015, 2:58 PM  [facility] [address] [place], [place], [address] Phone: [phone]   Fax:  [phone]  Name: Anthony Curry MRN: [id number] Date of Birth: May 18, 1941    Narda Bonds, [place] 12/08/2015 2:58 PM Phone: [phone] Fax: [phone]

## 2015-12-08 NOTE — Patient Instructions (Signed)
Hip Extension (Standing)    Stand holding onto support. Squeeze pelvic floor and hold. Move right leg backward with straight knee. Repeat 10 times. Do 2 times a day. Repeat with other leg.    Copyright  VHI. All rights reserved.   "I love a Magazine features editor for support.  March in place alternating legs 10 times each side.  Repeat 2 times a day.  http://gt2.exer.us/344   Copyright  VHI. All rights reserved.   HIP: Abduction - Standing (Weight)    Hold counter for support.  Raise leg out and slightly back keeping knee straight. Do 10 times on each leg.  Repeat 2 times a day.   Copyright  VHI. All rights reserved.   Tandem Stance    Hold counter for support.  Right foot in front of left, heel touching toe both feet "straight ahead". Stand on Foot Triangle of Support with both feet. Balance in this position 10 seconds. Do with left foot in front of right.  Repeat 3 times twice a day.  Copyright  VHI. All rights reserved.   SINGLE LIMB STANCE    Hold counter for support.  Stance: single leg on floor. Raise leg. Hold 10 seconds. Repeat with other leg. Do 3 times on each leg twice a day.  Copyright  VHI. All rights reserved.   Feet Heel-Toe "Tandem"    Arms holding onto counter, walk a straight line bringing one foot directly in front of the other. Repeat for 6'x6 reps. Do 2 sessions per day.  Copyright  VHI. All rights reserved.   Side-Stepping    Hold counter for support.  Walk to left side with eyes open. Take even steps, leading with same foot. Make sure each foot lifts off the floor. Repeat in opposite direction. Repeat for 6' x 6 reps. Do 2 sessions per day.  Copyright  VHI. All rights reserved.

## 2015-12-10 ENCOUNTER — Ambulatory Visit: Payer: Medicare Other | Admitting: Physical Therapy

## 2015-12-14 ENCOUNTER — Ambulatory Visit: Payer: Medicare Other | Admitting: Physical Therapy

## 2015-12-15 ENCOUNTER — Other Ambulatory Visit: Payer: Self-pay | Admitting: *Deleted

## 2015-12-15 DIAGNOSIS — C3490 Malignant neoplasm of unspecified part of unspecified bronchus or lung: Secondary | ICD-10-CM

## 2015-12-16 ENCOUNTER — Encounter: Payer: Self-pay | Admitting: Internal Medicine

## 2015-12-16 ENCOUNTER — Ambulatory Visit (HOSPITAL_BASED_OUTPATIENT_CLINIC_OR_DEPARTMENT_OTHER): Payer: Medicare Other

## 2015-12-16 ENCOUNTER — Other Ambulatory Visit (HOSPITAL_BASED_OUTPATIENT_CLINIC_OR_DEPARTMENT_OTHER): Payer: Medicare Other

## 2015-12-16 ENCOUNTER — Ambulatory Visit (HOSPITAL_BASED_OUTPATIENT_CLINIC_OR_DEPARTMENT_OTHER): Payer: Medicare Other | Admitting: Internal Medicine

## 2015-12-16 VITALS — BP 144/68 | HR 86 | Temp 97.6°F | Resp 18 | Ht 71.0 in | Wt 157.4 lb

## 2015-12-16 DIAGNOSIS — C3431 Malignant neoplasm of lower lobe, right bronchus or lung: Secondary | ICD-10-CM

## 2015-12-16 DIAGNOSIS — C7971 Secondary malignant neoplasm of right adrenal gland: Secondary | ICD-10-CM

## 2015-12-16 DIAGNOSIS — C7972 Secondary malignant neoplasm of left adrenal gland: Secondary | ICD-10-CM

## 2015-12-16 DIAGNOSIS — I4891 Unspecified atrial fibrillation: Secondary | ICD-10-CM

## 2015-12-16 DIAGNOSIS — Z5111 Encounter for antineoplastic chemotherapy: Secondary | ICD-10-CM | POA: Diagnosis present

## 2015-12-16 DIAGNOSIS — C3490 Malignant neoplasm of unspecified part of unspecified bronchus or lung: Secondary | ICD-10-CM

## 2015-12-16 LAB — CBC WITH DIFFERENTIAL/PLATELET
BASO%: 0.4 % (ref 0.0–2.0)
Basophils Absolute: 0 10*3/uL (ref 0.0–0.1)
EOS ABS: 0.3 10*3/uL (ref 0.0–0.5)
EOS%: 6.6 % (ref 0.0–7.0)
HCT: 41.1 % (ref 38.4–49.9)
HEMOGLOBIN: 14.3 g/dL (ref 13.0–17.1)
LYMPH%: 33.1 % (ref 14.0–49.0)
MCH: 31.7 pg (ref 27.2–33.4)
MCHC: 34.8 g/dL (ref 32.0–36.0)
MCV: 91.1 fL (ref 79.3–98.0)
MONO#: 0.6 10*3/uL (ref 0.1–0.9)
MONO%: 12.9 % (ref 0.0–14.0)
NEUT%: 47 % (ref 39.0–75.0)
NEUTROS ABS: 2.1 10*3/uL (ref 1.5–6.5)
Platelets: 226 10*3/uL (ref 140–400)
RBC: 4.51 10*6/uL (ref 4.20–5.82)
RDW: 14.4 % (ref 11.0–14.6)
WBC: 4.6 10*3/uL (ref 4.0–10.3)
lymph#: 1.5 10*3/uL (ref 0.9–3.3)

## 2015-12-16 LAB — COMPREHENSIVE METABOLIC PANEL
ALBUMIN: 3.9 g/dL (ref 3.5–5.0)
ALK PHOS: 111 U/L (ref 40–150)
ALT: 17 U/L (ref 0–55)
AST: 25 U/L (ref 5–34)
Anion Gap: 10 mEq/L (ref 3–11)
BILIRUBIN TOTAL: 0.66 mg/dL (ref 0.20–1.20)
BUN: 15.2 mg/dL (ref 7.0–26.0)
CO2: 25 meq/L (ref 22–29)
CREATININE: 0.8 mg/dL (ref 0.7–1.3)
Calcium: 9.4 mg/dL (ref 8.4–10.4)
Chloride: 99 mEq/L (ref 98–109)
EGFR: 85 mL/min/{1.73_m2} — ABNORMAL LOW (ref >90)
GLUCOSE: 105 mg/dL (ref 70–140)
Potassium: 3.8 mEq/L (ref 3.5–5.1)
SODIUM: 135 meq/L — AB (ref 136–145)
TOTAL PROTEIN: 6.7 g/dL (ref 6.4–8.3)

## 2015-12-16 MED ORDER — SODIUM CHLORIDE 0.9 % IV SOLN
240.0000 mg | Freq: Once | INTRAVENOUS | Status: AC
  Administered 2015-12-16: 240 mg via INTRAVENOUS
  Filled 2015-12-16: qty 8

## 2015-12-16 MED ORDER — SODIUM CHLORIDE 0.9 % IV SOLN
Freq: Once | INTRAVENOUS | Status: AC
  Administered 2015-12-16: 11:00:00 via INTRAVENOUS

## 2015-12-16 NOTE — Patient Instructions (Signed)
[facility] Discharge Instructions for Patients Receiving Chemotherapy  Today you received the following chemotherapy agents Nivolumab.  To help prevent nausea and vomiting after your treatment, we encourage you to take your nausea medication as prescribed.   If you develop nausea and vomiting that is not controlled by your nausea medication, call the clinic.   BELOW ARE SYMPTOMS THAT SHOULD BE REPORTED IMMEDIATELY:  *FEVER GREATER THAN 100.5 F  *CHILLS WITH OR WITHOUT FEVER  NAUSEA AND VOMITING THAT IS NOT CONTROLLED WITH YOUR NAUSEA MEDICATION  *UNUSUAL SHORTNESS OF BREATH  *UNUSUAL BRUISING OR BLEEDING  TENDERNESS IN MOUTH AND THROAT WITH OR WITHOUT PRESENCE OF ULCERS  *URINARY PROBLEMS  *BOWEL PROBLEMS  UNUSUAL RASH Items with * indicate a potential emergency and should be followed up as soon as possible.  Feel free to call the clinic you have any questions or concerns. The clinic phone number is [phone].  Please show the CHEMO ALERT CARD at check-in to the Emergency Department and triage nurse.   

## 2015-12-16 NOTE — Progress Notes (Signed)
[facility] [phone]   [phone] [place], [place] [address]  DIAGNOSIS: Stage IV (T1 A., N0, M1 B.) non-small cell lung cancer, adenocarcinoma with negative EGFR mutation and negative ALK gene translocation diagnosed in October of 2014.   Molecular profile: Negative for RET, ALK, BRAF, MET, EGFR.  Positive for ERBB2 A775T, [id number], KRAS G13E, MAP2K1 D67N (see full report).   PRIOR THERAPY:  1) Systemic chemotherapy with carboplatin for AUC of 5 and Alimta 500 mg/M2 every 3 weeks, status post 6 cycles with stable disease. First dose on 10/23/2013. 2) Maintenance systemic chemotherapy with single agent Alimta 500 mg/M2 every 3 weeks. Status post 8 cycles.   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 Sondra Come completed on 12/16/2014.  CURRENT THERAPY: Immunotherapy with Nivolumab 3 MG/KG every 2 weeks, status post 20 cycles. First cycle 01/28/2015  CHEMOTHERAPY INTENT: Palliative  CURRENT # OF CHEMOTHERAPY CYCLES: 21 CURRENT ANTIEMETICS: Zofran, Compazine and dexamethasone  CURRENT SMOKING STATUS: Former smoker  ORAL CHEMOTHERAPY AND CONSENT: None  CURRENT BISPHOSPHONATES USE: None  PAIN MANAGEMENT: 0/10  NARCOTICS INDUCED CONSTIPATION: None  LIVING WILL AND CODE STATUS: ?   INTERVAL HISTORY: Anthony Curry 75 y.o. male returns to the clinic today for followup visit accompanied by his wife. The patient is feeling fine today with no specific complaints except for occasional left iliac pain especially in the morning. He has been tolerating his treatment fairly well with no significant adverse effects. He has no fever or chills. He denied having any significant chest pain, shortness of breath, cough or hemoptysis. He denied having any nausea or vomiting. No significant weight loss or night sweats. He has no skin rash or diarrhea. He  is here today to start cycle #21 of his immunotherapy.  MEDICAL HISTORY: Past Medical History  Diagnosis Date  . Hypertension   . GERD (gastroesophageal reflux disease)   . Alcohol abuse   . 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 Low sodium   . Persistent atrial fibrillation ([place]) 04/11/14    chads2vasc score is 2  . Hypotension   . Atrial fibrillation with RVR ([place])     Dr. Allred,cardiology follows LOV 07-30-15  . HCAP (healthcare-associated pneumonia)   . 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 BPH (benign prostatic hyperplasia)   . Hyponatremia   . Anemia of chronic disease   . Fall   . Alcohol intoxication ([place])   . Pulmonary nodules   . Adrenal mass ([place])   . Atrial flutter by electrocardiogram ([place])   . 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 Alcoholism in remission ([place])   . Radiation 12/02/14-12/16/14    Left adrenal gland 30 Gy in 10 fractions  . Dysrhythmia     hx. atrial fibrillation-hx cardiversion- no recent issues- sees Dr. [name]  . Complication of anesthesia 09/08/2015     JITTERY AFTER GALLBLADDER SURGERY  . Anxiety   . Cancer ([place])     small cell/lung  . Neoplastic malignant related fatigue     IV tx. every 2 weeks last9-14-16 (Dr. Earlie Server), radiation last tx. 6 months    ALLERGIES:  has No Known Allergies.  MEDICATIONS:  Current Outpatient Prescriptions  Medication Sig Dispense Refill  . ALPRAZolam (XANAX) 0.25 MG tablet Take 0.25 mg by mouth 3 (three) times daily as needed for anxiety or sleep. And also at night for  sleep.    . apixaban (ELIQUIS) 5 MG TABS tablet Take 5 mg by mouth 2 (two) times daily.    . Ascorbic Acid (VITAMIN C PO) Take 1,000 mg by mouth daily.     . B Complex-C (SUPER B COMPLEX PO) Take 1 each by mouth daily.     . calcium carbonate (OS-CAL) 600 MG TABS Take 600 mg by mouth daily with breakfast.    . demeclocycline (DECLOMYCIN) 150 MG tablet Take 150 mg by mouth 2 (two) times daily.    [name] diltiazem (CARDIZEM CD) 180 MG  24 hr capsule Take 1 capsule (180 mg total) by mouth daily at 12 noon. 90 capsule 3  . furosemide (LASIX) 40 MG tablet Take 0.5 tablets (20 mg total) by mouth daily. (Patient taking differently: Take 20 mg by mouth every other day. ) 90 tablet 3  . HYDROcodone-acetaminophen (NORCO/VICODIN) 5-325 MG tablet Take 1-2 tablets by mouth every 4 (four) hours as needed for moderate pain. 30 tablet 0  . KLOR-CON M20 20 MEQ tablet Take 20 mEq by mouth every morning.     . Lactobacillus (ACIDOPHILUS) CAPS capsule Take 1 capsule by mouth daily.    [name] MAGNESIUM PO Take 400 mg by mouth daily.     . mirtazapine (REMERON) 15 MG tablet Take 1 tablet (15 mg total) by mouth at bedtime. (Patient taking differently: Take 15 mg by mouth at bedtime as needed (sleep). ) 30 tablet 0  . Multiple Vitamin (MULTIVITAMIN WITH MINERALS) TABS Take 1 tablet by mouth daily.    . Nivolumab (OPDIVO IV) Inject as directed every 2 weeks    . Nutritional Supplements (ENSURE COMPLETE PO) Take 1 Can by mouth 2 (two) times daily.    . Omega-3 Fatty Acids (FISH OIL PO) Take 1 tablet by mouth daily.    . pantoprazole (PROTONIX) 40 MG tablet Take 40 mg by mouth every morning.  8  . tamsulosin (FLOMAX) 0.4 MG CAPS capsule Take 0.4 mg by mouth daily at 12 noon.     . thiamine (VITAMIN B-1) 100 MG tablet Take 1 tablet (100 mg total) by mouth daily. 30 tablet 6  . vitamin E 400 UNIT capsule Take 400 Units by mouth daily.     No current facility-administered medications for this visit.    SURGICAL HISTORY:  Past Surgical History  Procedure Laterality Date  . Hemorrhoid surgery    . Ankle arthrodesis  1995    rt fx-hardware in  . Tonsillectomy    . Colonoscopy    . Excision / curettage bone cyst finger  2005    rt thumb  . Total shoulder arthroplasty  02/03/2012    Procedure: TOTAL SHOULDER ARTHROPLASTY;  Surgeon: [name], MD;  Location: [facility];  Service: Orthopedics;  Laterality: Left;  . Cardioversion N/A  09/29/2014    Procedure: CARDIOVERSION;  Surgeon: Josue Hector, MD;  Location: [facility] ENDOSCOPY;  Service: Cardiovascular;  Laterality: N/A;  . Percutaneous cholecytostomy tube      IVR insertion 06-13-15(Dr. Vernard Gambles)- 08-28-15 remains in place to drainage bag  . Ercp N/A 09/02/2015    Procedure: ENDOSCOPIC RETROGRADE CHOLANGIOPANCREATOGRAPHY (ERCP);  Surgeon: Arta Silence, MD;  Location: [facility] ENDOSCOPY;  Service: Endoscopy;  Laterality: N/A;  . Ercp N/A 09/07/2015    Procedure: ENDOSCOPIC RETROGRADE CHOLANGIOPANCREATOGRAPHY (ERCP);  Surgeon: Clarene Essex, MD;  Location: [facility] ENDOSCOPY;  Service: Endoscopy;  Laterality: N/A;  . Cholecystectomy  09/08/2015     laproscopic   . Cholecystectomy N/A  09/08/2015    Procedure: LAPAROSCOPIC CHOLECYSTECTOMY WITH ATTEMPTED INTRAOPERATIVE CHOLANGIOGRAM ;  Surgeon: Fanny Skates, MD;  Location: [facility];  Service: General;  Laterality: N/A;    REVIEW OF SYSTEMS:  A comprehensive review of systems was negative except for: Musculoskeletal: positive for back pain Neurological: positive for paresthesia   PHYSICAL EXAMINATION: General appearance: alert, cooperative, fatigued and no distress Head: Normocephalic, without obvious abnormality, atraumatic Neck: no adenopathy, no JVD, supple, symmetrical, trachea midline and thyroid not enlarged, symmetric, no tenderness/mass/nodules Lymph nodes: Cervical, supraclavicular, and axillary nodes normal. Resp: clear to auscultation bilaterally Back: symmetric, no curvature. ROM normal. No CVA tenderness. Cardio: regular rate and rhythm, S1, S2 normal, no murmur, click, rub or gallop GI: Right upper quadrant tenderness to palpation Extremities: extremities normal, atraumatic, no cyanosis or edema Neurologic: Alert and oriented X 3, normal strength and tone. Normal symmetric reflexes. Normal coordination and gait  ECOG PERFORMANCE STATUS: 1 - Symptomatic but completely ambulatory  Blood pressure 144/68, pulse 86, temperature 97.6  F (36.4 C), temperature source Oral, resp. rate 18, height _0  (1.803 m), weight 157 lb 6.4 oz (71.396 kg), SpO2 97 %.  LABORATORY DATA: Lab Results  Component Value Date   WBC 4.6 12/16/2015   HGB 14.3 12/16/2015   HCT 41.1 12/16/2015   MCV 91.1 12/16/2015   PLT 226 12/16/2015      Chemistry      Component Value Date/Time   NA 135* 12/02/2015 1046   NA 130* 09/09/2015 0530   K 3.8 12/02/2015 1046   K 3.8 09/09/2015 0530   CL 97* 09/09/2015 0530   CO2 23 12/02/2015 1046   CO2 26 09/09/2015 0530   BUN 16.4 12/02/2015 1046   BUN 7 09/09/2015 0530   CREATININE 0.8 12/02/2015 1046   CREATININE 0.76 09/09/2015 0530      Component Value Date/Time   CALCIUM 9.2 12/02/2015 1046   CALCIUM 8.2* 09/09/2015 0530   ALKPHOS 113 12/02/2015 1046   ALKPHOS 74 09/09/2015 0530   AST 22 12/02/2015 1046   AST 41 09/09/2015 0530   ALT 16 12/02/2015 1046   ALT 23 09/09/2015 0530   BILITOT 0.75 12/02/2015 1046   BILITOT 0.6 09/09/2015 0530       RADIOGRAPHIC STUDIES: No results found. ASSESSMENT AND PLAN: This is a very pleasant 75 years old white male with:  1)  Stage IV non-small cell lung cancer, adenocarcinoma status post induction chemotherapy with carboplatin and Alimta and currently on maintenance treatment with single agent Alimta status post 8 cycles. He has been observation for the last few months.  He had evidence for disease progression on the left adrenal gland and the patient underwent palliative radiotherapy to the left adrenal gland under the care of Dr. Sondra Come completed on 12/16/2014. He is currently undergoing treatment with immunotherapy with Nivolumab status post 20 cycles and tolerating his treatment fairly well.  he recovered well from the recent surgery. I recommended for the patient to resume his treatment with Nivolumab. He will start cycle #21 today. He would come back for follow-up visit in 2 weeks for reevaluation with the start of cycle #22.  2) Atrial  fibrillation: He'll continue his current medication with Cardizem, digoxin and metoprolol in addition to Eliquis. He'll also continue his routine followup visit with his cardiologist Dr. [name].  He was advised to call immediately if he has any concerning symptoms in the interval. The patient voices understanding of current disease status and treatment options and is in agreement  with the current care plan.  All questions were answered. The patient knows to call the clinic with any problems, questions or concerns. We can certainly see the patient much sooner if necessary.  Disclaimer: This note was dictated with voice recognition software. Similar sounding words can inadvertently be transcribed and may not be corrected upon review.       

## 2015-12-17 ENCOUNTER — Ambulatory Visit: Payer: Medicare Other | Admitting: Physical Therapy

## 2015-12-20 ENCOUNTER — Telehealth: Payer: Self-pay | Admitting: Internal Medicine

## 2015-12-20 NOTE — Telephone Encounter (Signed)
Left a message with added feb appointments

## 2015-12-22 ENCOUNTER — Ambulatory Visit: Payer: Medicare Other | Admitting: Physical Therapy

## 2015-12-22 DIAGNOSIS — R2681 Unsteadiness on feet: Secondary | ICD-10-CM

## 2015-12-22 DIAGNOSIS — R269 Unspecified abnormalities of gait and mobility: Secondary | ICD-10-CM

## 2015-12-22 NOTE — Patient Instructions (Signed)
Feet Together (Compliant Surface) Varied Arm Positions - Eyes Closed    Stand on compliant surface: __ON FLOOR______ with feet together and arms out: eyes OPEN and visualize upright position. Hold_30___ seconds. Repeat __2__ times per session. Do _2___ sessions per day. TURN HEAD SLOWLY for more challenge  Copyright  VHI. All rights reserved.  Heel Raise: Bilateral (Standing)    Rise on balls of feet. Repeat __10__ times per set. Do __2_ sets per session. Do __2__ sessions per day.  http://orth.exer.us/38   Copyright  VHI. All rights reserved.

## 2015-12-24 ENCOUNTER — Ambulatory Visit: Payer: Medicare Other | Admitting: Physical Therapy

## 2015-12-25 ENCOUNTER — Encounter: Payer: Self-pay | Admitting: Physical Therapy

## 2015-12-25 NOTE — Therapy (Signed)
[facility] [address] [place], [place], [address] Phone: [phone]   Fax:  [phone]  Physical Therapy Treatment  Patient Details  Name: Anthony Curry MRN: [id number] Date of Birth: 1941/10/27 Referring Provider: Dr. Sarina Ill  Encounter Date: 12/22/2015      PT End of Session - 12/25/15 1407    Visit Number 5   Number of Visits 9   Date for PT Re-Evaluation 12/03/15   Authorization Type Medicare   Authorization Time Period 11-03-15 - 01-02-16   PT Start Time 1015   PT Stop Time 1100   PT Time Calculation (min) 45 min      Past Medical History  Diagnosis Date  . Hypertension   . GERD (gastroesophageal reflux disease)   . Alcohol abuse   . 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 Low sodium   . Persistent atrial fibrillation ([place]) 04/11/14    chads2vasc score is 2  . Hypotension   . Atrial fibrillation with RVR ([place])     Dr. Allred,cardiology follows LOV 07-30-15  . HCAP (healthcare-associated pneumonia)   . 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 BPH (benign prostatic hyperplasia)   . Hyponatremia   . Anemia of chronic disease   . Fall   . Alcohol intoxication ([place])   . Pulmonary nodules   . Adrenal mass ([place])   . Atrial flutter by electrocardiogram ([place])   . 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 Alcoholism in remission ([place])   . Radiation 12/02/14-12/16/14    Left adrenal gland 30 Gy in 10 fractions  . Dysrhythmia     hx. atrial fibrillation-hx cardiversion- no recent issues- sees Dr. [name]  . Complication of anesthesia 09/08/2015     JITTERY AFTER GALLBLADDER SURGERY  . Anxiety   . Cancer ([place])     small cell/lung  . Neoplastic malignant related fatigue     IV tx. every 2 weeks last9-14-16 (Dr. Earlie Server), radiation last tx. 6 months    Past Surgical History  Procedure Laterality Date  . Hemorrhoid surgery    . Ankle arthrodesis  1995    rt fx-hardware in  . Tonsillectomy     . Colonoscopy    . Excision / curettage bone cyst finger  2005    rt thumb  . Total shoulder arthroplasty  02/03/2012    Procedure: TOTAL SHOULDER ARTHROPLASTY;  Surgeon: [name], MD;  Location: [facility];  Service: Orthopedics;  Laterality: Left;  . Cardioversion N/A 09/29/2014    Procedure: CARDIOVERSION;  Surgeon: Josue Hector, MD;  Location: [facility] ENDOSCOPY;  Service: Cardiovascular;  Laterality: N/A;  . Percutaneous cholecytostomy tube      IVR insertion 06-13-15(Dr. Vernard Gambles)- 08-28-15 remains in place to drainage bag  . Ercp N/A 09/02/2015    Procedure: ENDOSCOPIC RETROGRADE CHOLANGIOPANCREATOGRAPHY (ERCP);  Surgeon: Arta Silence, MD;  Location: [facility] END[facility];  Service: Endoscopy;  Laterality: N/A;  . Ercp N/A 09/07/2015    Procedure: ENDOSCOPIC RETROGRADE CHOLANGIOPANCREATOGRAPHY (ERCP);  Surgeon: Clarene Essex, MD;  Location: [facility];  Service: Endoscopy;  Laterality: N/A;  . Cholecystectomy  09/08/2015     laproscopic   . Cholecystectomy N/A 09/08/2015    Procedure: LAPAROSCOPIC CHOLECYSTECTOMY WITH ATTEMPTED INTRAOPERATIVE CHOLANGIOGRAM ;  Surgeon: Fanny Skates, MD;  Location: [facility];  Service: General;  Laterality: N/A;    There were no vitals filed for this visit.  Visit Diagnosis:  Abnormality of gait  Unsteadiness      Subjective Assessment - 12/25/15  1403    Subjective Pt asks if therapy is going to help his balance - feels that it is getting worse; states he has not been doing exercises at home due to having so many other appts and feeling weak after cancer treatment   Pertinent History lung cancer -diagnosed Nov. 2014; gall bladder removed in Oct. 2016; s/p L TSR 2 yrs ago   Patient Stated Goals Improve balance - be able to walk without use of walker (rollator) or cane   Currently in Pain? No/denies        Neuro Re-ed; reviewed HEP - pt performed standing forward, back and side kicks x 10 reps each with UE support;  Marching x 10 reps in  place; standing with feet together EO and with feet apart with EC with UE support prn; Sidestepping along counter 2 reps; tandem stance 30 secs each position; SLS 10 sec hold x 2 reps  TherEx:  Heel raises x 10 raises with UE support; sit to stand x 10 reps with UE support prn   Discussed recommendation for use of RW for assistance with amb. Outside the home - pt declines this recommendation at this time -  Wishes to continue to use cane in community and use RW in home                      PT Education - 12/25/15 1406    Education provided Yes   Education Details added heel raises and standing with eyes closed on floor with UE support prn   Person(s) Educated Patient   Methods Explanation;Demonstration;Handout   Comprehension Verbalized understanding;Returned demonstration             PT Long Term Goals - 11/08/15 2049    PT LONG TERM GOAL #1   Title Incr. gait velocity to >/= 3.1 ft/sec with use of cane for incr. gait efficiency.  12-05-15   Time 4   Period Weeks   Status New   PT LONG TERM GOAL #2   Title Incr. Berg balance test score to >/= 45/56 to reduce fall risk.  12-05-15   Baseline 40/56 on 11-03-15   Period Weeks   Status New   PT LONG TERM GOAL #3   Title Independent in HEP for balance exercises. 12-05-15   Time 4   Period Weeks   Status New   PT LONG TERM GOAL #4   Title Perform SOT and establish goal as appropriate.  12-05-15   Time 4   Period Weeks   Status New               Plan - 12/25/15 1407    Clinical Impression Statement Pt demonstrates minimal to no vestibular input in maintaining balance with eyes closed and exhibits significant LEweakness as evidenced by c/o feeling "weak" and legs "limp" after performing standing exercises   Pt will benefit from skilled therapeutic intervention in order to improve on the following deficits Abnormal gait;Decreased balance;Dizziness;Impaired sensation;Decreased mobility;Decreased  coordination;Decreased strength   Rehab Potential Good   PT Frequency 2x / week   PT Duration 4 weeks   PT Treatment/Interventions ADLs/Self Care Home Management;Balance training;Therapeutic exercise;Therapeutic activities;Functional mobility training;Stair training;Gait training;Neuromuscular re-education;Patient/family education;Vestibular   PT Next Visit Plan review HEP   PT Home Exercise Plan balance and vestibular exercises - added heel raises   Consulted and Agree with Plan of Care Patient        Problem List Patient Active Problem List  Diagnosis Date Noted  . Neuropathy ([place]) 10/15/2015  . Acute urinary retention 09/09/2015  . Choledocholithiasis with cholecystitis 09/08/2015  . Common bile duct stone   . Encounter for antineoplastic immunotherapy 06/28/2015  . Atrial fibrillation ([place]) 06/11/2015  . Gynecomastia, male 06/10/2015  . Dizziness 05/11/2015  . Malignant neoplasm of lower lobe of right lung ([place]) 09/17/2014  . Neutropenic fever ([place]) 09/02/2014  . Neoplastic malignant related fatigue 08/13/2014  . Physical deconditioning 08/13/2014  . Hypoalbuminemia 08/13/2014  . Diarrhea 08/13/2014  . Antineoplastic chemotherapy induced anemia(285.3) 08/13/2014  . Atrial fibrillation with RVR ([place]) 07/28/2014  . HCAP (healthcare-associated pneumonia) 07/25/2014  . Alcoholism in remission ([place]) 07/25/2014  . Atrial flutter by electrocardiogram ([place]) 07/25/2014  . Swelling of limb 06/10/2014  . Lung cancer ([place]) 10/15/2013  . Hypotension 09/10/2013  . Adrenal mass ([place]) 09/09/2013  . 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 Pulmonary nodules 08/19/2013  . Anemia of chronic disease 07/03/2013  . BPH (benign prostatic hyperplasia) 07/03/2013  . GERD (gastroesophageal reflux disease) 07/03/2013  . Fall 07/03/2013  . Alcohol intoxication ([place]) 07/03/2013  . 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 Alcohol abuse 10/15/2011  . Hyponatremia 10/15/2011    [place]ness [place], PT 12/25/2015, 2:11 PM  [facility] [address] [place], [place], [address] Phone: [phone]   Fax:  [phone]  Name: Anthony Curry MRN: [id number] Date of Birth: Aug 27, 1941

## 2015-12-29 ENCOUNTER — Other Ambulatory Visit: Payer: Self-pay | Admitting: Internal Medicine

## 2015-12-30 ENCOUNTER — Other Ambulatory Visit: Payer: Self-pay | Admitting: Internal Medicine

## 2015-12-30 ENCOUNTER — Encounter: Payer: Self-pay | Admitting: Internal Medicine

## 2015-12-30 ENCOUNTER — Ambulatory Visit (HOSPITAL_BASED_OUTPATIENT_CLINIC_OR_DEPARTMENT_OTHER): Payer: Medicare Other | Admitting: Internal Medicine

## 2015-12-30 ENCOUNTER — Ambulatory Visit (HOSPITAL_BASED_OUTPATIENT_CLINIC_OR_DEPARTMENT_OTHER): Payer: Medicare Other

## 2015-12-30 ENCOUNTER — Other Ambulatory Visit (HOSPITAL_BASED_OUTPATIENT_CLINIC_OR_DEPARTMENT_OTHER): Payer: Medicare Other

## 2015-12-30 VITALS — BP 159/71 | HR 79 | Temp 97.6°F | Resp 18 | Ht 71.0 in | Wt 159.4 lb

## 2015-12-30 DIAGNOSIS — Z5112 Encounter for antineoplastic immunotherapy: Secondary | ICD-10-CM

## 2015-12-30 DIAGNOSIS — R252 Cramp and spasm: Secondary | ICD-10-CM | POA: Diagnosis not present

## 2015-12-30 DIAGNOSIS — C3490 Malignant neoplasm of unspecified part of unspecified bronchus or lung: Secondary | ICD-10-CM

## 2015-12-30 DIAGNOSIS — R530 Neoplastic (malignant) related fatigue: Secondary | ICD-10-CM

## 2015-12-30 DIAGNOSIS — I4891 Unspecified atrial fibrillation: Secondary | ICD-10-CM

## 2015-12-30 DIAGNOSIS — C7972 Secondary malignant neoplasm of left adrenal gland: Secondary | ICD-10-CM

## 2015-12-30 DIAGNOSIS — C3431 Malignant neoplasm of lower lobe, right bronchus or lung: Secondary | ICD-10-CM

## 2015-12-30 DIAGNOSIS — C3491 Malignant neoplasm of unspecified part of right bronchus or lung: Secondary | ICD-10-CM

## 2015-12-30 DIAGNOSIS — R5383 Other fatigue: Secondary | ICD-10-CM

## 2015-12-30 LAB — CBC WITH DIFFERENTIAL/PLATELET
BASO%: 0.7 % (ref 0.0–2.0)
BASOS ABS: 0 10*3/uL (ref 0.0–0.1)
EOS%: 7.8 % — AB (ref 0.0–7.0)
Eosinophils Absolute: 0.4 10*3/uL (ref 0.0–0.5)
HCT: 39.2 % (ref 38.4–49.9)
HEMOGLOBIN: 13.9 g/dL (ref 13.0–17.1)
LYMPH%: 27.7 % (ref 14.0–49.0)
MCH: 32.2 pg (ref 27.2–33.4)
MCHC: 35.5 g/dL (ref 32.0–36.0)
MCV: 90.7 fL (ref 79.3–98.0)
MONO#: 0.5 10*3/uL (ref 0.1–0.9)
MONO%: 10.5 % (ref 0.0–14.0)
NEUT#: 2.4 10*3/uL (ref 1.5–6.5)
NEUT%: 53.3 % (ref 39.0–75.0)
Platelets: 233 10*3/uL (ref 140–400)
RBC: 4.32 10*6/uL (ref 4.20–5.82)
RDW: 14.1 % (ref 11.0–14.6)
WBC: 4.5 10*3/uL (ref 4.0–10.3)
lymph#: 1.2 10*3/uL (ref 0.9–3.3)

## 2015-12-30 LAB — LACTATE DEHYDROGENASE: LDH: 135 U/L (ref 125–245)

## 2015-12-30 MED ORDER — SODIUM CHLORIDE 0.9 % IV SOLN
240.0000 mg | Freq: Once | INTRAVENOUS | Status: AC
  Administered 2015-12-30: 240 mg via INTRAVENOUS
  Filled 2015-12-30: qty 20

## 2015-12-30 MED ORDER — SODIUM CHLORIDE 0.9 % IV SOLN
Freq: Once | INTRAVENOUS | Status: AC
  Administered 2015-12-30: 11:00:00 via INTRAVENOUS

## 2015-12-30 NOTE — Patient Instructions (Signed)
[facility] Discharge Instructions for Patients Receiving Chemotherapy  Today you received the following chemotherapy agents Nivolumab  To help prevent nausea and vomiting after your treatment, we encourage you to take your nausea medication     If you develop nausea and vomiting that is not controlled by your nausea medication, call the clinic.   BELOW ARE SYMPTOMS THAT SHOULD BE REPORTED IMMEDIATELY:  *FEVER GREATER THAN 100.5 F  *CHILLS WITH OR WITHOUT FEVER  NAUSEA AND VOMITING THAT IS NOT CONTROLLED WITH YOUR NAUSEA MEDICATION  *UNUSUAL SHORTNESS OF BREATH  *UNUSUAL BRUISING OR BLEEDING  TENDERNESS IN MOUTH AND THROAT WITH OR WITHOUT PRESENCE OF ULCERS  *URINARY PROBLEMS  *BOWEL PROBLEMS  UNUSUAL RASH Items with * indicate a potential emergency and should be followed up as soon as possible.  Feel free to call the clinic you have any questions or concerns. The clinic phone number is [phone].  Please show the CHEMO ALERT CARD at check-in to the Emergency Department and triage nurse.   

## 2015-12-30 NOTE — Progress Notes (Signed)
[facility] [phone]   Fax:([phone]) [place], [place] [address]  DIAGNOSIS: Stage IV (T1 A., N0, M1 B.) non-small cell lung cancer, adenocarcinoma with negative EGFR mutation and negative ALK gene translocation diagnosed in October of 2014.   Molecular profile: Negative for RET, ALK, BRAF, MET, EGFR.  Positive for ERBB2 A775T, ZLD3T701X, KRAS G13E, MAP2K1 D67N (see full report).   PRIOR THERAPY:  1) Systemic chemotherapy with carboplatin for AUC of 5 and Alimta 500 mg/M2 every 3 weeks, status post 6 cycles with stable disease. First dose on 10/23/2013. 2) Maintenance systemic chemotherapy with single agent Alimta 500 mg/M2 every 3 weeks. Status post 8 cycles.   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 Sondra Come completed on 12/16/2014.  CURRENT THERAPY: Immunotherapy with Nivolumab 3 MG/KG every 2 weeks, status post 21 cycles. First cycle 01/28/2015  CHEMOTHERAPY INTENT: Palliative  CURRENT # OF CHEMOTHERAPY CYCLES: 22 CURRENT ANTIEMETICS: Zofran, Compazine and dexamethasone  CURRENT SMOKING STATUS: Former smoker  ORAL CHEMOTHERAPY AND CONSENT: None  CURRENT BISPHOSPHONATES USE: None  PAIN MANAGEMENT: 0/10  NARCOTICS INDUCED CONSTIPATION: None  LIVING WILL AND CODE STATUS: ?   INTERVAL HISTORY: Anthony Curry 75 y.o. male returns to the clinic today for followup visit accompanied by his wife. The patient is feeling fine today with no specific complaints except for mild fatigue and spasm on the back. He has been tolerating his treatment fairly well with no significant adverse effects. He has no fever or chills. He denied having any significant chest pain, shortness of breath, cough or hemoptysis. He denied having any nausea or vomiting. No significant weight loss or night sweats. He has no skin rash or diarrhea. He is here today to  start cycle #22 of his immunotherapy.  MEDICAL HISTORY: Past Medical History  Diagnosis Date  . Hypertension   . GERD (gastroesophageal reflux disease)   . Alcohol abuse   . 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 Low sodium   . Persistent atrial fibrillation ([facility]) 04/11/14    chads2vasc score is 2  . Hypotension   . Atrial fibrillation with RVR ([facility])     Dr. Allred,cardiology follows LOV 07-30-15  . HCAP (healthcare-associated pneumonia)   . 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 BPH (benign prostatic hyperplasia)   . Hyponatremia   . Anemia of chronic disease   . Fall   . Alcohol intoxication ([place])   . Pulmonary nodules   . Adrenal mass ([place])   . Atrial flutter by electrocardiogram ([place])   . 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 Alcoholism in remission ([place])   . Radiation 12/02/14-12/16/14    Left adrenal gland 30 Gy in 10 fractions  . Dysrhythmia     hx. atrial fibrillation-hx cardiversion- no recent issues- sees Dr. [name]  . Complication of anesthesia 09/08/2015     JITTERY AFTER GALLBLADDER SURGERY  . Anxiety   . Cancer ([facility])     small cell/lung  . Neoplastic malignant related fatigue     IV tx. every 2 weeks last9-14-16 (Dr. Earlie Server), radiation last tx. 6 months    ALLERGIES:  has No Known Allergies.  MEDICATIONS:  Current Outpatient Prescriptions  Medication Sig Dispense Refill  . ALPRAZolam (XANAX) 0.25 MG tablet Take 0.25 mg by mouth 3 (three) times daily as needed for anxiety or sleep. And also at night for sleep.    [name]  apixaban (ELIQUIS) 5 MG TABS tablet Take 5 mg by mouth 2 (two) times daily.    . Ascorbic Acid (VITAMIN C PO) Take 1,000 mg by mouth daily.     . B Complex-C (SUPER B COMPLEX PO) Take 1 each by mouth daily.     . calcium carbonate (OS-CAL) 600 MG TABS Take 600 mg by mouth daily with breakfast.    . demeclocycline (DECLOMYCIN) 150 MG tablet Take 150 mg by mouth 2 (two) times daily.    [name] diltiazem (CARDIZEM CD) 180 MG 24 hr capsule  Take 1 capsule (180 mg total) by mouth daily at 12 noon. 90 capsule 3  . furosemide (LASIX) 40 MG tablet Take 0.5 tablets (20 mg total) by mouth daily. (Patient taking differently: Take 20 mg by mouth every other day. ) 90 tablet 3  . HYDROcodone-acetaminophen (NORCO/VICODIN) 5-325 MG tablet Take 1-2 tablets by mouth every 4 (four) hours as needed for moderate pain. 30 tablet 0  . KLOR-CON M20 20 MEQ tablet Take 20 mEq by mouth every morning.     . Lactobacillus (ACIDOPHILUS) CAPS capsule Take 1 capsule by mouth daily.    [name] MAGNESIUM PO Take 400 mg by mouth daily.     . mirtazapine (REMERON) 15 MG tablet Take 1 tablet (15 mg total) by mouth at bedtime. (Patient taking differently: Take 15 mg by mouth at bedtime as needed (sleep). ) 30 tablet 0  . Multiple Vitamin (MULTIVITAMIN WITH MINERALS) TABS Take 1 tablet by mouth daily.    . Nivolumab (OPDIVO IV) Inject as directed every 2 weeks    . Nutritional Supplements (ENSURE COMPLETE PO) Take 1 Can by mouth 2 (two) times daily.    . Omega-3 Fatty Acids (FISH OIL PO) Take 1 tablet by mouth daily.    . pantoprazole (PROTONIX) 40 MG tablet Take 40 mg by mouth every morning.  8  . tamsulosin (FLOMAX) 0.4 MG CAPS capsule Take 0.4 mg by mouth daily at 12 noon.     . thiamine (VITAMIN B-1) 100 MG tablet Take 1 tablet (100 mg total) by mouth daily. 30 tablet 6  . vitamin E 400 UNIT capsule Take 400 Units by mouth daily.     No current facility-administered medications for this visit.    SURGICAL HISTORY:  Past Surgical History  Procedure Laterality Date  . Hemorrhoid surgery    . Ankle arthrodesis  1995    rt fx-hardware in  . Tonsillectomy    . Colonoscopy    . Excision / curettage bone cyst finger  2005    rt thumb  . Total shoulder arthroplasty  02/03/2012    Procedure: TOTAL SHOULDER ARTHROPLASTY;  Surgeon: [name], MD;  Location: [facility];  Service: Orthopedics;  Laterality: Left;  . Cardioversion N/A 09/29/2014     Procedure: CARDIOVERSION;  Surgeon: Josue Hector, MD;  Location: [facility] ENDOSCOPY;  Service: Cardiovascular;  Laterality: N/A;  . Percutaneous cholecytostomy tube      IVR insertion 06-13-15(Dr. Vernard Gambles)- 08-28-15 remains in place to drainage bag  . Ercp N/A 09/02/2015    Procedure: ENDOSCOPIC RETROGRADE CHOLANGIOPANCREATOGRAPHY (ERCP);  Surgeon: Arta Silence, MD;  Location: [facility] ENDOSCOPY;  Service: Endoscopy;  Laterality: N/A;  . Ercp N/A 09/07/2015    Procedure: ENDOSCOPIC RETROGRADE CHOLANGIOPANCREATOGRAPHY (ERCP);  Surgeon: Clarene Essex, MD;  Location: [facility] ENDOSCOPY;  Service: Endoscopy;  Laterality: N/A;  . Cholecystectomy  09/08/2015     laproscopic   . Cholecystectomy N/A 09/08/2015    Procedure:  LAPAROSCOPIC CHOLECYSTECTOMY WITH ATTEMPTED INTRAOPERATIVE CHOLANGIOGRAM ;  Surgeon: Fanny Skates, MD;  Location: [facility];  Service: General;  Laterality: N/A;    REVIEW OF SYSTEMS:  A comprehensive review of systems was negative except for: Constitutional: positive for fatigue Musculoskeletal: positive for back pain   PHYSICAL EXAMINATION: General appearance: alert, cooperative, fatigued and no distress Head: Normocephalic, without obvious abnormality, atraumatic Neck: no adenopathy, no JVD, supple, symmetrical, trachea midline and thyroid not enlarged, symmetric, no tenderness/mass/nodules Lymph nodes: Cervical, supraclavicular, and axillary nodes normal. Resp: clear to auscultation bilaterally Back: symmetric, no curvature. ROM normal. No CVA tenderness. Cardio: regular rate and rhythm, S1, S2 normal, no murmur, click, rub or gallop GI: Right upper quadrant tenderness to palpation Extremities: extremities normal, atraumatic, no cyanosis or edema Neurologic: Alert and oriented X 3, normal strength and tone. Normal symmetric reflexes. Normal coordination and gait  ECOG PERFORMANCE STATUS: 1 - Symptomatic but completely ambulatory  Blood pressure 159/71, pulse 79, temperature 97.6 F (36.4 C),  resp. rate 18, height '5\' 11"'$  (1.803 m), weight 159 lb 6.4 oz (72.303 kg), SpO2 98 %.  LABORATORY DATA: Lab Results  Component Value Date   WBC 4.5 12/30/2015   HGB 13.9 12/30/2015   HCT 39.2 12/30/2015   MCV 90.7 12/30/2015   PLT 233 12/30/2015      Chemistry      Component Value Date/Time   NA 135* 12/16/2015 0850   NA 130* 09/09/2015 0530   K 3.8 12/16/2015 0850   K 3.8 09/09/2015 0530   CL 97* 09/09/2015 0530   CO2 25 12/16/2015 0850   CO2 26 09/09/2015 0530   BUN 15.2 12/16/2015 0850   BUN 7 09/09/2015 0530   CREATININE 0.8 12/16/2015 0850   CREATININE 0.76 09/09/2015 0530      Component Value Date/Time   CALCIUM 9.4 12/16/2015 0850   CALCIUM 8.2* 09/09/2015 0530   ALKPHOS 111 12/16/2015 0850   ALKPHOS 74 09/09/2015 0530   AST 25 12/16/2015 0850   AST 41 09/09/2015 0530   ALT 17 12/16/2015 0850   ALT 23 09/09/2015 0530   BILITOT 0.66 12/16/2015 0850   BILITOT 0.6 09/09/2015 0530       RADIOGRAPHIC STUDIES: No results found. ASSESSMENT AND PLAN: This is a very pleasant 75 years old white male with:  1)  Stage IV non-small cell lung cancer, adenocarcinoma status post induction chemotherapy with carboplatin and Alimta and currently on maintenance treatment with single agent Alimta status post 8 cycles. He has been observation for the last few months.  He had evidence for disease progression on the left adrenal gland and the patient underwent palliative radiotherapy to the left adrenal gland under the care of Dr. Sondra Come completed on 12/16/2014. He is currently undergoing treatment with immunotherapy with Nivolumab status post 21 cycles and tolerating his treatment fairly well. I recommended for the patient to resume his treatment with Nivolumab. He will start cycle #22 today. He would come back for follow-up visit in 2 weeks for reevaluation with the start of cycle #23 after repeating CT scan of the chest, abdomen and pelvis for restaging of his disease.  2)  Atrial fibrillation: He'll continue his current medication with Cardizem, digoxin and metoprolol in addition to Eliquis. He'll also continue his routine followup visit with his cardiologist Dr. [name].  He was advised to call immediately if he has any concerning symptoms in the interval. The patient voices understanding of current disease status and treatment options and is in agreement with  the current care plan.  All questions were answered. The patient knows to call the clinic with any problems, questions or concerns. We can certainly see the patient much sooner if necessary.  Disclaimer: This note was dictated with voice recognition software. Similar sounding words can inadvertently be transcribed and may not be corrected upon review.

## 2015-12-31 ENCOUNTER — Telehealth: Payer: Self-pay | Admitting: Internal Medicine

## 2015-12-31 NOTE — Telephone Encounter (Signed)
S/w pt's wife, advised (per 1/26 POF) md has ordered ct scan to be done prior to 2/8 appt. Advised c-sched will call to sched scan but they need to pick up contrast. She verbalized understanding.

## 2016-01-05 ENCOUNTER — Ambulatory Visit: Payer: Medicare Other | Admitting: Physical Therapy

## 2016-01-05 DIAGNOSIS — R269 Unspecified abnormalities of gait and mobility: Secondary | ICD-10-CM

## 2016-01-05 DIAGNOSIS — R2681 Unsteadiness on feet: Secondary | ICD-10-CM | POA: Diagnosis not present

## 2016-01-05 NOTE — Patient Instructions (Signed)
Hamstring Stretch (Standing)    Standing, place one heel on chair or bench. Use one or both hands on thigh for support. Keeping torso straight, lean forward slowly until a stretch is felt in back of same thigh. Hold _45-60___ seconds. Repeat with other leg.  Copyright  VHI. All rights reserved.  STAIRS: Step at a Time    Step up, leading with left leg. Step up with other foot onto same step. _10__ reps per set, _1-2__ sets per day, _5__ days per week Repeat leading with other leg. Hold railing as needed for balance;     (ALTERNATE TAP UPS)  Copyright  VHI. All rights reserved.   Practice going up and down steps as tolerated for strengthening!!

## 2016-01-06 ENCOUNTER — Encounter: Payer: Self-pay | Admitting: Physical Therapy

## 2016-01-06 NOTE — Therapy (Signed)
[facility] [address] [place], [place], [address] Phone: [phone]   Fax:  [phone]  Physical Therapy Treatment  Patient Details  Name: Anthony Curry MRN: [id number] Date of Birth: June 10, 1941 Referring Provider: Dr. Sarina Ill  Encounter Date: 01/05/2016      PT End of Session - 01/06/16 1522    Visit Number 6   Number of Visits 9   Date for PT Re-Evaluation 01/05/16   Authorization Type Medicare   Authorization Time Period 01-05-16 - 03-03-16   PT Start Time 1104   PT Stop Time 1146   PT Time Calculation (min) 42 min      Past Medical History  Diagnosis Date  . Hypertension   . GERD (gastroesophageal reflux disease)   . Alcohol abuse   . 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 Low sodium   . Persistent atrial fibrillation ([place]) 04/11/14    chads2vasc score is 2  . Hypotension   . Atrial fibrillation with RVR ([place])     Dr. Allred,cardiology follows LOV 07-30-15  . HCAP (healthcare-associated pneumonia)   . 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[facility])   . BPH (benign prostatic hyperplasia)   . Hyponatremia   . Anemia of chronic disease   . Fall   . Alcohol intoxication ([place])   . Pulmonary nodules   . Adrenal mass ([place])   . Atrial flutter by electrocardiogram ([place])   . 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 Alcoholism in remission ([place])   . Radiation 12/02/14-12/16/14    Left adrenal gland 30 Gy in 10 fractions  . Dysrhythmia     hx. atrial fibrillation-hx cardiversion- no recent issues- sees Dr. [name]  . Complication of anesthesia 09/08/2015     JITTERY AFTER GALLBLADDER SURGERY  . Anxiety   . Cancer ([facility])     small cell/lung  . Neoplastic malignant related fatigue     IV tx. every 2 weeks last9-14-16 (Dr. Earlie Server), radiation last tx. 6 months    Past Surgical History  Procedure Laterality Date  . Hemorrhoid surgery    . Ankle arthrodesis  1995    rt fx-hardware in  . Tonsillectomy     . Colonoscopy    . Excision / curettage bone cyst finger  2005    rt thumb  . Total shoulder arthroplasty  02/03/2012    Procedure: TOTAL SHOULDER ARTHROPLASTY;  Surgeon: [name], MD;  Location: [facility];  Service: Orthopedics;  Laterality: Left;  . Cardioversion N/A 09/29/2014    Procedure: CARDIOVERSION;  Surgeon: Josue Hector, MD;  Location: [facility] ENDOSCOPY;  Service: Cardiovascular;  Laterality: N/A;  . Percutaneous cholecytostomy tube      IVR insertion 06-13-15(Dr. Vernard Gambles)- 08-28-15 remains in place to drainage bag  . Ercp N/A 09/02/2015    Procedure: ENDOSCOPIC RETROGRADE CHOLANGIOPANCREATOGRAPHY (ERCP);  Surgeon: Arta Silence, MD;  Location: [facility] ENDOSCOPY;  Service: Endoscopy;  Laterality: N/A;  . Ercp N/A 09/07/2015    Procedure: ENDOSCOPIC RETROGRADE CHOLANGIOPANCREATOGRAPHY (ERCP);  Surgeon: Clarene Essex, MD;  Location: [facility] ENDOSCOPY;  Service: Endoscopy;  Laterality: N/A;  . Cholecystectomy  09/08/2015     laproscopic   . Cholecystectomy N/A 09/08/2015    Procedure: LAPAROSCOPIC CHOLECYSTECTOMY WITH ATTEMPTED INTRAOPERATIVE CHOLANGIOGRAM ;  Surgeon: Fanny Skates, MD;  Location: [facility];  Service: General;  Laterality: N/A;    There were no vitals filed for this visit.  Visit Diagnosis:  Abnormality of gait - Plan: PT plan of care cert/re-cert  Unsteadiness - Plan:  PT plan of care cert/re-cert      Subjective Assessment - 01/06/16 1519    Subjective Pt states he has done the exercises some- but feels that he is not going to get much better with overall balance   Pertinent History lung cancer -diagnosed Nov. 2014; gall bladder removed in Oct. 2016; s/p L TSR 2 yrs ago   Patient Stated Goals Improve balance - be able to walk without use of walker (rollator) or cane   Currently in Pain? No/denies                              Balance Exercises - 01/06/16 1520    Balance Exercises: Standing   Standing Eyes Opened Narrow base of  support (BOS);Wide (BOA);Head turns;Foam/compliant surface;Solid surface;Other reps (comment)  10 reps   Standing Eyes Closed Wide (BOA);Narrow base of support (BOS);Solid surface;Other reps (comment)  10 reps   Tandem Stance Eyes open;Upper extremity support 2;3 reps;10 secs   SLS Eyes open;Solid surface;Upper extremity support 2;3 reps;10 secs   Rockerboard Anterior/posterior;Lateral;EO;10 reps;UE support   Step Ups Forward;6 inch;UE support 2   Tandem Gait Forward;Upper extremity support;5 reps;Other (comment)  8'   Partial Tandem Stance Eyes open;1 rep;Upper extremity support 1   Sidestepping Upper extremity support;Other (comment)  8' x 4 reps   Other Standing Exercises hip abduction, extension and flexion x 10 bil LE with UE support     TherEx: Leg press 40# bil. LE's 3 sets 10 reps Bil. Hamstring stretch on step - 30 sec hold Step up exercise - RLE and LLE 10 reps each with UE support;  Heel raises x 10 reps      PT Education - 01/06/16 1522    Education provided Yes   Education Details see pt instructions   Person(s) Educated Patient   Methods Explanation;Demonstration;Handout   Comprehension Verbalized understanding;Returned demonstration             PT Long Term Goals - 01/06/16 1526    PT LONG TERM GOAL #1   Title Incr. gait velocity to >/= 3.1 ft/sec with use of cane for incr. gait efficiency.  02-02-16   Status On-going   PT LONG TERM GOAL #2   Title Incr. Berg balance test score to >/= 45/56 to reduce fall risk.  02-02-16   Baseline 40/56 on 11-03-15   Status On-going   PT LONG TERM GOAL #3   Title Independent in HEP for balance exercises. 12-05-15   Baseline met 01-05-16   Status Achieved   PT LONG TERM GOAL #4   Title Perform SOT and establish goal as appropriate.  12-05-15   Baseline SOT completed but pt unable to maintain balance on majority of conditions   Status Deferred               Plan - 01/06/16 1524    Clinical Impression  Statement Pt is unable to maintain balance (standing) with EC or without visual input, i.e.  walking backwards - due to lack of vestibular function/input in maintaining balance   Pt will benefit from skilled therapeutic intervention in order to improve on the following deficits Abnormal gait;Decreased balance;Dizziness;Impaired sensation;Decreased mobility;Decreased coordination;Decreased strength   Rehab Potential Good   PT Frequency 2x / week   PT Duration 4 weeks   PT Treatment/Interventions ADLs/Self Care Home Management;Balance training;Therapeutic exercise;Therapeutic activities;Functional mobility training;Stair training;Gait training;Neuromuscular re-education;Patient/family education;Vestibular   PT Next Visit Plan cont balance and  LE strengthening exercises   PT Home Exercise Plan balance and vestibular exercises - added heel raises   Consulted and Agree with Plan of Care Patient        Problem List Patient Active Problem List   Diagnosis Date Noted  . Neuropathy ([place]) 10/15/2015  . Acute urinary retention 09/09/2015  . Choledocholithiasis with cholecystitis 09/08/2015  . Common bile duct stone   . Encounter for antineoplastic immunotherapy 06/28/2015  . Atrial fibrillation ([place]) 06/11/2015  . Gynecomastia, male 06/10/2015  . Dizziness 05/11/2015  . Malignant neoplasm of lower lobe of right lung ([place]) 09/17/2014  . Neutropenic fever ([place]) 09/02/2014  . Neoplastic malignant related fatigue 08/13/2014  . Physical deconditioning 08/13/2014  . Hypoalbuminemia 08/13/2014  . Diarrhea 08/13/2014  . Antineoplastic chemotherapy induced anemia(285.3) 08/13/2014  . Atrial fibrillation with RVR ([place]) 07/28/2014  . HCAP (healthcare-associated pneumonia) 07/25/2014  . Alcoholism in remission ([place]) 07/25/2014  . Atrial flutter by electrocardiogram ([place]) 07/25/2014  . Swelling of limb 06/10/2014  . Lung cancer ([place]) 10/15/2013  . Hypotension 09/10/2013  . Adrenal mass ([place])  09/09/2013  . 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 Pulmonary nodules 08/19/2013  . Anemia of chronic disease 07/03/2013  . BPH (benign prostatic hyperplasia) 07/03/2013  . GERD (gastroesophageal reflux disease) 07/03/2013  . Fall 07/03/2013  . Alcohol intoxication ([facility]) 07/03/2013  . 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 Alcohol abuse 10/15/2011  . Hyponatremia 10/15/2011    [place], PT 01/06/2016, 3:35 PM  [facility] [address] [place], [place], [address] Phone: [phone]   Fax:  [phone]  Name: Anthony Curry MRN: [id number] Date of Birth: 04-Oct-1941

## 2016-01-07 ENCOUNTER — Emergency Department (HOSPITAL_COMMUNITY)
Admission: EM | Admit: 2016-01-07 | Discharge: 2016-01-07 | Disposition: A | Discharge status: Home / Self Care | Payer: Medicare Other | Attending: Emergency Medicine | Admitting: Emergency Medicine

## 2016-01-07 ENCOUNTER — Emergency Department (HOSPITAL_COMMUNITY): Payer: Medicare Other

## 2016-01-07 ENCOUNTER — Encounter (HOSPITAL_COMMUNITY): Payer: Self-pay | Admitting: Emergency Medicine

## 2016-01-07 DIAGNOSIS — Z79899 Other long term (current) drug therapy: Secondary | ICD-10-CM | POA: Insufficient documentation

## 2016-01-07 DIAGNOSIS — R824 Acetonuria: Secondary | ICD-10-CM

## 2016-01-07 DIAGNOSIS — D649 Anemia, unspecified: Secondary | ICD-10-CM | POA: Diagnosis not present

## 2016-01-07 DIAGNOSIS — Z87891 Personal history of nicotine dependence: Secondary | ICD-10-CM | POA: Insufficient documentation

## 2016-01-07 DIAGNOSIS — R05 Cough: Secondary | ICD-10-CM | POA: Diagnosis not present

## 2016-01-07 DIAGNOSIS — K219 Gastro-esophageal reflux disease without esophagitis: Secondary | ICD-10-CM | POA: Diagnosis not present

## 2016-01-07 DIAGNOSIS — M19012 Primary osteoarthritis, left shoulder: Secondary | ICD-10-CM | POA: Diagnosis not present

## 2016-01-07 DIAGNOSIS — J111 Influenza due to unidentified influenza virus with other respiratory manifestations: Secondary | ICD-10-CM | POA: Insufficient documentation

## 2016-01-07 DIAGNOSIS — I481 Persistent atrial fibrillation: Secondary | ICD-10-CM | POA: Insufficient documentation

## 2016-01-07 DIAGNOSIS — R531 Weakness: Secondary | ICD-10-CM | POA: Insufficient documentation

## 2016-01-07 DIAGNOSIS — N400 Enlarged prostate without lower urinary tract symptoms: Secondary | ICD-10-CM | POA: Diagnosis not present

## 2016-01-07 DIAGNOSIS — Z85118 Personal history of other malignant neoplasm of bronchus and lung: Secondary | ICD-10-CM | POA: Insufficient documentation

## 2016-01-07 DIAGNOSIS — F419 Anxiety disorder, unspecified: Secondary | ICD-10-CM | POA: Diagnosis not present

## 2016-01-07 DIAGNOSIS — R69 Illness, unspecified: Secondary | ICD-10-CM

## 2016-01-07 DIAGNOSIS — I10 Essential (primary) hypertension: Secondary | ICD-10-CM | POA: Insufficient documentation

## 2016-01-07 DIAGNOSIS — R51 Headache: Secondary | ICD-10-CM | POA: Diagnosis present

## 2016-01-07 DIAGNOSIS — R52 Pain, unspecified: Secondary | ICD-10-CM | POA: Diagnosis not present

## 2016-01-07 DIAGNOSIS — Z8701 Personal history of pneumonia (recurrent): Secondary | ICD-10-CM | POA: Insufficient documentation

## 2016-01-07 DIAGNOSIS — R509 Fever, unspecified: Secondary | ICD-10-CM | POA: Diagnosis not present

## 2016-01-07 LAB — CBC WITH DIFFERENTIAL/PLATELET
BASOS ABS: 0 10*3/uL (ref 0.0–0.1)
Basophils Relative: 0 %
Eosinophils Absolute: 0 10*3/uL (ref 0.0–0.7)
Eosinophils Relative: 1 %
HEMATOCRIT: 40.1 % (ref 39.0–52.0)
HEMOGLOBIN: 14.3 g/dL (ref 13.0–17.0)
LYMPHS PCT: 10 %
Lymphs Abs: 0.5 10*3/uL — ABNORMAL LOW (ref 0.7–4.0)
MCH: 32.1 pg (ref 26.0–34.0)
MCHC: 35.7 g/dL (ref 30.0–36.0)
MCV: 89.9 fL (ref 78.0–100.0)
MONO ABS: 0.6 10*3/uL (ref 0.1–1.0)
MONOS PCT: 13 %
NEUTROS ABS: 3.5 10*3/uL (ref 1.7–7.7)
NEUTROS PCT: 76 %
Platelets: 182 10*3/uL (ref 150–400)
RBC: 4.46 MIL/uL (ref 4.22–5.81)
RDW: 13.7 % (ref 11.5–15.5)
WBC: 4.6 10*3/uL (ref 4.0–10.5)

## 2016-01-07 LAB — URINALYSIS, ROUTINE W REFLEX MICROSCOPIC
BILIRUBIN URINE: NEGATIVE
Glucose, UA: NEGATIVE mg/dL
Hgb urine dipstick: NEGATIVE
Ketones, ur: >80 mg/dL — AB
Leukocytes, UA: NEGATIVE
NITRITE: NEGATIVE
PH: 5.5 (ref 5.0–8.0)
Protein, ur: NEGATIVE mg/dL
SPECIFIC GRAVITY, URINE: 1.021 (ref 1.005–1.030)

## 2016-01-07 LAB — COMPREHENSIVE METABOLIC PANEL
ALBUMIN: 4.2 g/dL (ref 3.5–5.0)
ALT: 22 U/L (ref 17–63)
AST: 38 U/L (ref 15–41)
Alkaline Phosphatase: 115 U/L (ref 38–126)
Anion gap: 13 (ref 5–15)
BUN: 16 mg/dL (ref 6–20)
CHLORIDE: 94 mmol/L — AB (ref 101–111)
CO2: 22 mmol/L (ref 22–32)
CREATININE: 0.85 mg/dL (ref 0.61–1.24)
Calcium: 8.7 mg/dL — ABNORMAL LOW (ref 8.9–10.3)
GFR calc Af Amer: >60 mL/min (ref >60)
GFR calc non Af Amer: >60 mL/min (ref >60)
GLUCOSE: 95 mg/dL (ref 65–99)
POTASSIUM: 4 mmol/L (ref 3.5–5.1)
SODIUM: 129 mmol/L — AB (ref 135–145)
Total Bilirubin: 1.2 mg/dL (ref 0.3–1.2)
Total Protein: 6.8 g/dL (ref 6.5–8.1)

## 2016-01-07 LAB — LIPASE, BLOOD: Lipase: 17 U/L (ref 11–51)

## 2016-01-07 LAB — I-STAT CG4 LACTIC ACID, ED: Lactic Acid, Venous: 1.34 mmol/L (ref 0.5–2.0)

## 2016-01-07 LAB — TROPONIN I: Troponin I: <0.03 ng/mL (ref <0.031)

## 2016-01-07 MED ORDER — OSELTAMIVIR PHOSPHATE 75 MG PO CAPS
75.0000 mg | ORAL_CAPSULE | Freq: Once | ORAL | Status: AC
  Administered 2016-01-07: 75 mg via ORAL
  Filled 2016-01-07: qty 1

## 2016-01-07 MED ORDER — OSELTAMIVIR PHOSPHATE 75 MG PO CAPS: 75.0000 mg | ORAL_CAPSULE | Freq: Every day | ORAL | Status: AC

## 2016-01-07 MED ORDER — SODIUM CHLORIDE 0.9 % IV BOLUS (SEPSIS)
1000.0000 mL | Freq: Once | INTRAVENOUS | Status: AC
  Administered 2016-01-07: 1000 mL via INTRAVENOUS

## 2016-01-07 NOTE — ED Notes (Signed)
Delay in discharge due to medication being sent from pharmacy

## 2016-01-07 NOTE — Discharge Instructions (Signed)
As discussed, your evaluation today has been largely reassuring.  But, it is important that you monitor your condition carefully, and do not hesitate to return to the ED if you develop new, or concerning changes in your condition. ? ?Otherwise, please follow-up with your physician for appropriate ongoing care. ? ?

## 2016-01-07 NOTE — ED Notes (Signed)
MD at bedside. 

## 2016-01-07 NOTE — ED Notes (Signed)
RN Anderson Malta attempting IV insertion

## 2016-01-07 NOTE — ED Notes (Signed)
Pt given urinal and made aware of need for urine specimen 

## 2016-01-07 NOTE — ED Notes (Signed)
Patient presents with HA, fever, generalized malaise, non productive cough, decreased appetitie x1 day. Denies N/V/D.

## 2016-01-07 NOTE — ED Notes (Signed)
2 Unsuccessful IV attempt by Catalina Antigua, RN

## 2016-01-07 NOTE — ED Provider Notes (Signed)
CSN: [id number]     Arrival date & time 01/07/16  1802 History   First MD Initiated Contact with Patient 01/07/16 1821     Chief Complaint  Patient presents with  . Headache  . Fever     (Consider location/radiation/quality/duration/timing/severity/associated sxs/prior Treatment) HPI Patient presents with concern of fever, generalized weakness, diffuse mild myalgia, anorexia. Symptoms began about 36 hours ago, initially with mild "tickle" in his throat. The remainder of his symptoms developed gradually, and currently he complains mostly of generalized weakness. He specifically denies confusion, disorientation, stiff neck, vision changes. Patient has history of lung cancer, actively receiving infusion therapy, every other week. He is one week status post his most recent therapy session. Patient notes that his daughter had similar illness in the past week. Since onset, he has not taken any new medication, and there are no clear alleviating or exacerbating factors.  Past Medical History  Diagnosis Date  . Hypertension   . GERD (gastroesophageal reflux disease)   . Alcohol abuse   . 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 Low sodium   . Persistent atrial fibrillation ([place]) 04/11/14    chads2vasc score is 2  . Hypotension   . Atrial fibrillation with RVR ([place])     Dr. Allred,cardiology follows LOV 07-30-15  . HCAP (healthcare-associated pneumonia)   . 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 BPH (benign prostatic hyperplasia)   . Hyponatremia   . Anemia of chronic disease   . Fall   . Alcohol intoxication ([place])   . Pulmonary nodules   . Adrenal mass ([place])   . Atrial flutter by electrocardiogram ([place])   . 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 Alcoholism in remission ([place])   . Radiation 12/02/14-12/16/14    Left adrenal gland 30 Gy in 10 fractions  . Dysrhythmia     hx. atrial fibrillation-hx cardiversion- no recent issues- sees Dr. [name]  . Complication of anesthesia  09/08/2015     JITTERY AFTER GALLBLADDER SURGERY  . Anxiety   . Cancer ([place])     small cell/lung  . Neoplastic malignant related fatigue     IV tx. every 2 weeks last9-14-16 (Dr. Earlie Server), radiation last tx. 6 months   Past Surgical History  Procedure Laterality Date  . Hemorrhoid surgery    . Ankle arthrodesis  1995    rt fx-hardware in  . Tonsillectomy    . Colonoscopy    . Excision / curettage bone cyst finger  2005    rt thumb  . Total shoulder arthroplasty  02/03/2012    Procedure: TOTAL SHOULDER ARTHROPLASTY;  Surgeon: [name], MD;  Location: [facility];  Service: Orthopedics;  Laterality: Left;  . Cardioversion N/A 09/29/2014    Procedure: CARDIOVERSION;  Surgeon: Josue Hector, MD;  Location: [facility] ENDOSCOPY;  Service: Cardiovascular;  Laterality: N/A;  . Percutaneous cholecytostomy tube      IVR insertion 06-13-15(Dr. Vernard Gambles)- 08-28-15 remains in place to drainage bag  . Ercp N/A 09/02/2015    Procedure: ENDOSCOPIC RETROGRADE CHOLANGIOPANCREATOGRAPHY (ERCP);  Surgeon: Arta Silence, MD;  Location: [facility] ENDOSCOPY;  Service: Endoscopy;  Laterality: N/A;  . Ercp N/A 09/07/2015    Procedure: ENDOSCOPIC RETROGRADE CHOLANGIOPANCREATOGRAPHY (ERCP);  Surgeon: Clarene Essex, MD;  Location: [facility] ENDOSCOPY;  Service: Endoscopy;  Laterality: N/A;  . Cholecystectomy  09/08/2015     laproscopic   . Cholecystectomy N/A 09/08/2015    Procedure: LAPAROSCOPIC CHOLECYSTECTOMY WITH ATTEMPTED INTRAOPERATIVE CHOLANGIOGRAM ;  Surgeon: Fanny Skates, MD;  Location: [facility]  OR;  Service: General;  Laterality: N/A;   Family History  Problem Relation Age of Onset  . Brain cancer Brother   . Lung cancer Mother   . Hypertension Father   . Heart attack Father   . Diabetes Paternal Grandmother   . Heart attack Brother   . Neuropathy Neg Hx    Social History  Substance Use Topics  . Smoking status: Former Smoker -- 2.00 packs/day for 30 years    Types: Cigarettes    Quit date: 12/05/1990   . Smokeless tobacco: Never Used  . Alcohol Use: 0.0 oz/week    0 Standard drinks or equivalent per week     Comment: past hx.ETOH abuse,08-31-15 "occ. wine ,drinks non alcoholic beer occ."    Review of Systems  Constitutional:       Per HPI, otherwise negative  HENT:       Per HPI, otherwise negative  Respiratory:       Per HPI, otherwise negative  Cardiovascular:       Per HPI, otherwise negative  Gastrointestinal: Negative for vomiting.  Endocrine:       Negative aside from HPI  Genitourinary:       Neg aside from HPI   Musculoskeletal:       Per HPI, otherwise negative  Skin: Negative for color change.  Allergic/Immunologic: Negative for immunocompromised state.  Neurological: Negative for syncope.      Allergies  Review of patient's allergies indicates no known allergies.  Home Medications   Prior to Admission medications   Medication Sig Start Date End Date Taking? Authorizing Provider  ALPRAZolam (XANAX) 0.25 MG tablet Take 0.25 mg by mouth 3 (three) times daily as needed for anxiety or sleep. And also at night for sleep.    Historical Provider, MD  apixaban (ELIQUIS) 5 MG TABS tablet Take 5 mg by mouth 2 (two) times daily.    Historical Provider, MD  Ascorbic Acid (VITAMIN C PO) Take 1,000 mg by mouth daily.     Historical Provider, MD  B Complex-C (SUPER B COMPLEX PO) Take 1 each by mouth daily.     Historical Provider, MD  calcium carbonate (OS-CAL) 600 MG TABS Take 600 mg by mouth daily with breakfast.    Historical Provider, MD  demeclocycline (DECLOMYCIN) 150 MG tablet Take 150 mg by mouth 2 (two) times daily.    Historical Provider, MD  diltiazem (CARDIZEM CD) 180 MG 24 hr capsule Take 1 capsule (180 mg total) by mouth daily at 12 noon. 01/09/15   Sherran Needs, NP  furosemide (LASIX) 40 MG tablet Take 0.5 tablets (20 mg total) by mouth daily. Patient taking differently: Take 20 mg by mouth every other day.  07/20/15   Sherran Needs, NP   HYDROcodone-acetaminophen (NORCO/VICODIN) 5-325 MG tablet Take 1-2 tablets by mouth every 4 (four) hours as needed for moderate pain. 11/18/15   Coralee Pesa Wertman, PA-C  KLOR-CON M20 20 MEQ tablet Take 20 mEq by mouth every morning.  10/18/14   Historical Provider, MD  Lactobacillus (ACIDOPHILUS) CAPS capsule Take 1 capsule by mouth daily.    Historical Provider, MD  MAGNESIUM PO Take 400 mg by mouth daily.     Historical Provider, MD  mirtazapine (REMERON) 15 MG tablet Take 1 tablet (15 mg total) by mouth at bedtime. Patient taking differently: Take 15 mg by mouth at bedtime as needed (sleep).  01/19/15   [name], MD  Multiple Vitamin (MULTIVITAMIN WITH MINERALS) TABS Take 1 tablet  by mouth daily.    Historical Provider, MD  Nivolumab (OPDIVO IV) Inject as directed every 2 weeks    Historical Provider, MD  Nutritional Supplements (ENSURE COMPLETE PO) Take 1 Can by mouth 2 (two) times daily.    Historical Provider, MD  Omega-3 Fatty Acids (FISH OIL PO) Take 1 tablet by mouth daily.    Historical Provider, MD  pantoprazole (PROTONIX) 40 MG tablet Take 40 mg by mouth every morning. 06/02/15   Historical Provider, MD  tamsulosin (FLOMAX) 0.4 MG CAPS capsule Take 0.4 mg by mouth daily at 12 noon.     Historical Provider, MD  thiamine (VITAMIN B-1) 100 MG tablet Take 1 tablet (100 mg total) by mouth daily. 09/11/13   Erick Colace, NP  vitamin E 400 UNIT capsule Take 400 Units by mouth daily.    Historical Provider, MD   BP 135/76 mmHg  Pulse 115  Temp(Src) 100.3 F (37.9 C) (Oral)  Resp 18  SpO2 95% Physical Exam  Constitutional: He is oriented to person, place, and time. He appears well-developed. No distress.  HENT:  Head: Normocephalic and atraumatic.  Eyes: Conjunctivae and EOM are normal.  Cardiovascular: Regular rhythm.  Tachycardia present.   Pulmonary/Chest: Effort normal. No stridor. No respiratory distress. He has no wheezes.  Abdominal: He exhibits no distension. There is no  tenderness.  Musculoskeletal: He exhibits no edema.  Neurological: He is alert and oriented to person, place, and time. No cranial nerve deficit. He exhibits normal muscle tone.  No obvious deficits.  Skin: Skin is warm and dry.  Psychiatric: He has a normal mood and affect.  Nursing note and vitals reviewed.   ED Course  Procedures (including critical care time) Labs Review Labs Reviewed  COMPREHENSIVE METABOLIC PANEL - Abnormal; Notable for the following:    Sodium 129 (*)    Chloride 94 (*)    Calcium 8.7 (*)    All other components within normal limits  CBC WITH DIFFERENTIAL/PLATELET - Abnormal; Notable for the following:    Lymphs Abs 0.5 (*)    All other components within normal limits  URINALYSIS, ROUTINE W REFLEX MICROSCOPIC (NOT AT [facility]) - Abnormal; Notable for the following:    Ketones, ur >80 (*)    All other components within normal limits  CULTURE, BLOOD (ROUTINE X 2)  CULTURE, BLOOD (ROUTINE X 2)  LIPASE, BLOOD  TROPONIN I  I-STAT CG4 LACTIC ACID, ED    Imaging Review Dg Chest 2 View  01/07/2016  CLINICAL DATA:  Dry cough and fever since last night. Patient on chemotherapy for lung carcinoma. EXAM: CHEST  2 VIEW COMPARISON:  CT, 11/09/2015.  Chest radiograph, 09/10/2015. FINDINGS: Cardiac silhouette is normal in size and configuration. No mediastinal or hilar masses or convincing adenopathy. Lungs are hyperexpanded. There are stable areas of lung scarring as well as changes of emphysema. No evidence of pneumonia or edema. No pleural effusion or pneumothorax. Multiple old right-sided rib fractures. Old right proximal humerus fracture. Left shoulder prosthesis stable. IMPRESSION: 1. No acute cardiopulmonary disease.  No evidence of pneumonia. 2. COPD. Electronically Signed   By: Lajean Manes M.D.   On: 01/07/2016 19:07   I have personally reviewed and evaluated these images and lab results as part of my medical decision-making.   Cardiac: 105 st, abn  o2- 99%ra,  nml  Update: Patient awake, alert, c/o fatigue, no pain.  Results (incl ketonuria) d/w him and wife.  11:20 PM Following 2 L fluid resuscitation the patient  is hemodynamically stable, has complaints of fatigue, no other new complaint sprayed Family has concerns of possible influenza. Had a lengthy conversation about testing for this, therapy for this. Given the patient's symptoms are only one-day-old, empirically therapy is reasonable. Patient will discuss this with his oncologist tomorrow.   MDM  Patient receiving ongoing therapy for lung cancer presents with generalized complaints, including fatigue, myalgia. Here, the patient is awake, alert. Patient was noted to have fever at home, but received Tylenol prior to ED arrival. Patient is found to have ketonuria, otherwise reassuring labs. Patient received 2 L fluid resuscitation, with improvement in his condition. For hours of monitoring, no decompensation, no evidence for occult bacteremia, sepsis. No evidence for neutropenia. Patient discharged in stable condition to follow-up with oncology.   Carmin Muskrat, MD 01/07/16 2322

## 2016-01-08 ENCOUNTER — Telehealth: Payer: Self-pay | Admitting: *Deleted

## 2016-01-08 NOTE — Telephone Encounter (Signed)
Wife states patient went to ED with fever, dehydration. Daughter has a "severe case of confirmed flu". ED gave patient a dose of Tamiflu as well as a prescription- instructed them to cal oncologist to make sure it is OK to take.  Dr [name] is out of the office. Selena Lesser, NP reviewed and states it is fine to take Tamiflu. Wife notified

## 2016-01-11 ENCOUNTER — Ambulatory Visit (HOSPITAL_COMMUNITY): Payer: Medicare Other

## 2016-01-11 ENCOUNTER — Encounter (HOSPITAL_COMMUNITY): Payer: Self-pay

## 2016-01-11 ENCOUNTER — Ambulatory Visit (HOSPITAL_COMMUNITY)
Admission: RE | Admit: 2016-01-11 | Discharge: 2016-01-11 | Disposition: A | Discharge status: Home / Self Care | Payer: Medicare Other | Source: Ambulatory Visit | Attending: Internal Medicine | Admitting: Internal Medicine

## 2016-01-11 ENCOUNTER — Telehealth: Payer: Self-pay | Admitting: Medical Oncology

## 2016-01-11 DIAGNOSIS — R918 Other nonspecific abnormal finding of lung field: Secondary | ICD-10-CM | POA: Insufficient documentation

## 2016-01-11 DIAGNOSIS — R530 Neoplastic (malignant) related fatigue: Secondary | ICD-10-CM | POA: Insufficient documentation

## 2016-01-11 DIAGNOSIS — Z9221 Personal history of antineoplastic chemotherapy: Secondary | ICD-10-CM | POA: Insufficient documentation

## 2016-01-11 DIAGNOSIS — I2510 Atherosclerotic heart disease of native coronary artery without angina pectoris: Secondary | ICD-10-CM | POA: Insufficient documentation

## 2016-01-11 DIAGNOSIS — Z923 Personal history of irradiation: Secondary | ICD-10-CM | POA: Diagnosis not present

## 2016-01-11 DIAGNOSIS — K5730 Diverticulosis of large intestine without perforation or abscess without bleeding: Secondary | ICD-10-CM | POA: Diagnosis not present

## 2016-01-11 DIAGNOSIS — N62 Hypertrophy of breast: Secondary | ICD-10-CM | POA: Insufficient documentation

## 2016-01-11 DIAGNOSIS — C3431 Malignant neoplasm of lower lobe, right bronchus or lung: Secondary | ICD-10-CM | POA: Diagnosis not present

## 2016-01-11 DIAGNOSIS — J439 Emphysema, unspecified: Secondary | ICD-10-CM | POA: Insufficient documentation

## 2016-01-11 DIAGNOSIS — Z5112 Encounter for antineoplastic immunotherapy: Secondary | ICD-10-CM

## 2016-01-11 MED ORDER — IOHEXOL 300 MG/ML  SOLN
100.0000 mL | Freq: Once | INTRAMUSCULAR | Status: AC | PRN
  Administered 2016-01-11: 100 mL via INTRAVENOUS

## 2016-01-11 NOTE — Telephone Encounter (Signed)
Will his cough interfere with Ct scan. It has been r/s to 1330.

## 2016-01-12 ENCOUNTER — Other Ambulatory Visit: Payer: Self-pay | Admitting: *Deleted

## 2016-01-12 DIAGNOSIS — C3431 Malignant neoplasm of lower lobe, right bronchus or lung: Secondary | ICD-10-CM

## 2016-01-12 LAB — CULTURE, BLOOD (ROUTINE X 2)
Culture: NO GROWTH
Culture: NO GROWTH

## 2016-01-13 ENCOUNTER — Ambulatory Visit (HOSPITAL_BASED_OUTPATIENT_CLINIC_OR_DEPARTMENT_OTHER): Payer: Medicare Other | Admitting: Internal Medicine

## 2016-01-13 ENCOUNTER — Ambulatory Visit: Payer: Medicare Other

## 2016-01-13 ENCOUNTER — Telehealth: Payer: Self-pay | Admitting: Internal Medicine

## 2016-01-13 ENCOUNTER — Other Ambulatory Visit: Payer: Self-pay | Admitting: Pharmacist

## 2016-01-13 ENCOUNTER — Telehealth: Payer: Self-pay | Admitting: *Deleted

## 2016-01-13 ENCOUNTER — Encounter: Payer: Self-pay | Admitting: Internal Medicine

## 2016-01-13 ENCOUNTER — Other Ambulatory Visit (HOSPITAL_BASED_OUTPATIENT_CLINIC_OR_DEPARTMENT_OTHER): Payer: Medicare Other

## 2016-01-13 VITALS — BP 113/56 | HR 101 | Temp 98.2°F | Resp 18 | Ht 71.0 in | Wt 153.7 lb

## 2016-01-13 DIAGNOSIS — C7972 Secondary malignant neoplasm of left adrenal gland: Secondary | ICD-10-CM | POA: Diagnosis not present

## 2016-01-13 DIAGNOSIS — C3490 Malignant neoplasm of unspecified part of unspecified bronchus or lung: Secondary | ICD-10-CM | POA: Diagnosis not present

## 2016-01-13 DIAGNOSIS — C3431 Malignant neoplasm of lower lobe, right bronchus or lung: Secondary | ICD-10-CM

## 2016-01-13 DIAGNOSIS — I4891 Unspecified atrial fibrillation: Secondary | ICD-10-CM

## 2016-01-13 DIAGNOSIS — R530 Neoplastic (malignant) related fatigue: Secondary | ICD-10-CM

## 2016-01-13 DIAGNOSIS — E038 Other specified hypothyroidism: Secondary | ICD-10-CM | POA: Diagnosis not present

## 2016-01-13 DIAGNOSIS — C3491 Malignant neoplasm of unspecified part of right bronchus or lung: Secondary | ICD-10-CM

## 2016-01-13 DIAGNOSIS — Z5112 Encounter for antineoplastic immunotherapy: Secondary | ICD-10-CM

## 2016-01-13 LAB — CBC WITH DIFFERENTIAL/PLATELET
BASO%: 0.1 % (ref 0.0–2.0)
Basophils Absolute: 0 10*3/uL (ref 0.0–0.1)
EOS%: 1.3 % (ref 0.0–7.0)
Eosinophils Absolute: 0.1 10*3/uL (ref 0.0–0.5)
HCT: 40.6 % (ref 38.4–49.9)
HGB: 14.6 g/dL (ref 13.0–17.1)
LYMPH%: 13.8 % — AB (ref 14.0–49.0)
MCH: 31.9 pg (ref 27.2–33.4)
MCHC: 36 g/dL (ref 32.0–36.0)
MCV: 88.6 fL (ref 79.3–98.0)
MONO#: 0.5 10*3/uL (ref 0.1–0.9)
MONO%: 6.8 % (ref 0.0–14.0)
NEUT%: 78 % — ABNORMAL HIGH (ref 39.0–75.0)
NEUTROS ABS: 5.4 10*3/uL (ref 1.5–6.5)
Platelets: 200 10*3/uL (ref 140–400)
RBC: 4.58 10*6/uL (ref 4.20–5.82)
RDW: 13.4 % (ref 11.0–14.6)
WBC: 6.9 10*3/uL (ref 4.0–10.3)
lymph#: 1 10*3/uL (ref 0.9–3.3)

## 2016-01-13 LAB — COMPREHENSIVE METABOLIC PANEL
ALT: 33 U/L (ref 0–55)
AST: 32 U/L (ref 5–34)
Albumin: 3.9 g/dL (ref 3.5–5.0)
Alkaline Phosphatase: 107 U/L (ref 40–150)
Anion Gap: 13 mEq/L — ABNORMAL HIGH (ref 3–11)
BUN: 10.2 mg/dL (ref 7.0–26.0)
CHLORIDE: 94 meq/L — AB (ref 98–109)
CO2: 23 meq/L (ref 22–29)
CREATININE: 0.8 mg/dL (ref 0.7–1.3)
Calcium: 9.3 mg/dL (ref 8.4–10.4)
EGFR: 87 mL/min/{1.73_m2} — AB (ref >90)
GLUCOSE: 126 mg/dL (ref 70–140)
Potassium: 3.4 mEq/L — ABNORMAL LOW (ref 3.5–5.1)
SODIUM: 131 meq/L — AB (ref 136–145)
TOTAL PROTEIN: 6.8 g/dL (ref 6.4–8.3)
Total Bilirubin: 1.15 mg/dL (ref 0.20–1.20)

## 2016-01-13 LAB — TSH: TSH: 2.185 m(IU)/L (ref 0.320–4.118)

## 2016-01-13 LAB — LACTATE DEHYDROGENASE: LDH: 188 U/L (ref 125–245)

## 2016-01-13 NOTE — Telephone Encounter (Signed)
Pt confirmed labs/ov per 02/08 POF, gave pt AVS and Calendar.Cherylann Banas, sent msg to add chemo

## 2016-01-13 NOTE — Telephone Encounter (Signed)
Per staff message and POF I have scheduled appts. Advised scheduler of appts and to move labs. JMW  

## 2016-01-13 NOTE — Progress Notes (Signed)
[place] [phone]   Fax:([phone]) [place], [place] [id number]  DIAGNOSIS: Stage IV (T1 A., N0, M1 B.) non-small cell lung cancer, adenocarcinoma with negative EGFR mutation and negative ALK gene translocation diagnosed in October of 2014.   Molecular profile: Negative for RET, ALK, BRAF, MET, EGFR.  Positive for ERBB2 A775T, [id number], KRAS G13E, MAP2K1 D67N (see full report).   PRIOR THERAPY:  1) Systemic chemotherapy with carboplatin for AUC of 5 and Alimta 500 mg/M2 every 3 weeks, status post 6 cycles with stable disease. First dose on 10/23/2013. 2) Maintenance systemic chemotherapy with single agent Alimta 500 mg/M2 every 3 weeks. Status post 8 cycles.   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 Sondra Come completed on 12/16/2014.  CURRENT THERAPY: Immunotherapy with Nivolumab 3 MG/KG every 2 weeks, status post 22 cycles. First cycle 01/28/2015  CHEMOTHERAPY INTENT: Palliative  CURRENT # OF CHEMOTHERAPY CYCLES: 23 CURRENT ANTIEMETICS: Zofran, Compazine and dexamethasone  CURRENT SMOKING STATUS: Former smoker  ORAL CHEMOTHERAPY AND CONSENT: None  CURRENT BISPHOSPHONATES USE: None  PAIN MANAGEMENT: 0/10  NARCOTICS INDUCED CONSTIPATION: None  LIVING WILL AND CODE STATUS: ?   INTERVAL HISTORY: Anthony Curry 75 y.o. male returns to the clinic today for followup visit accompanied by his wife. The patient is feeling a little bit more tired and fatigued today. He and his family had flu diagnosed recently and he still recovering. He is currently on Tamiflu. He has been tolerating his treatment fairly well with no significant adverse effects. He has no fever or chills. He denied having any significant chest pain, shortness of breath, cough or hemoptysis. He denied having any nausea or vomiting. No significant weight loss or night  sweats. He has no skin rash or diarrhea. He had repeat CT scan of the chest, abdomen and pelvis performed recently and he is here for evaluation and discussion of his scan results.  MEDICAL HISTORY: Past Medical History  Diagnosis Date  . Hypertension   . GERD (gastroesophageal reflux disease)   . Alcohol abuse   . 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 Low sodium   . Persistent atrial fibrillation ([place]) 04/11/14    chads2vasc score is 2  . Hypotension   . Atrial fibrillation with RVR ([place])     Dr. Allred,cardiology follows LOV 07-30-15  . HCAP (healthcare-associated pneumonia)   . 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 BPH (benign prostatic hyperplasia)   . Hyponatremia   . Anemia of chronic disease   . Fall   . Alcohol intoxication ([place])   . Pulmonary nodules   . Adrenal mass ([place])   . Atrial flutter by electrocardiogram ([place])   . 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 Alcoholism in remission ([place])   . Radiation 12/02/14-12/16/14    Left adrenal gland 30 Gy in 10 fractions  . Dysrhythmia     hx. atrial fibrillation-hx cardiversion- no recent issues- sees Dr. [name]  . Complication of anesthesia 09/08/2015     JITTERY AFTER GALLBLADDER SURGERY  . Anxiety   . Cancer ([place])     small cell/lung  . Neoplastic malignant related fatigue     IV tx. every 2 weeks last9-14-16 (Dr. Earlie Server), radiation last tx. 6 months    ALLERGIES:  has No Known Allergies.  MEDICATIONS:  Current Outpatient Prescriptions  Medication Sig Dispense Refill  . ALPRAZolam Duanne Moron)  0.25 MG tablet Take 0.25 mg by mouth 3 (three) times daily as needed for anxiety or sleep. And also at night for sleep.    [name] apixaban (ELIQUIS) 5 MG TABS tablet Take 5 mg by mouth 2 (two) times daily.    . Ascorbic Acid (VITAMIN C PO) Take 1,000 mg by mouth daily.     . B Complex-C (SUPER B COMPLEX PO) Take 1 each by mouth daily.     . calcium carbonate (OS-CAL) 600 MG TABS Take 600 mg by mouth daily with breakfast.     . demeclocycline (DECLOMYCIN) 150 MG tablet Take 150 mg by mouth 2 (two) times daily.    [name] diltiazem (CARDIZEM CD) 180 MG 24 hr capsule Take 1 capsule (180 mg total) by mouth daily at 12 noon. 90 capsule 3  . furosemide (LASIX) 40 MG tablet Take 0.5 tablets (20 mg total) by mouth daily. (Patient taking differently: Take 20 mg by mouth every other day. ) 90 tablet 3  . HYDROcodone-acetaminophen (NORCO/VICODIN) 5-325 MG tablet Take 1-2 tablets by mouth every 4 (four) hours as needed for moderate pain. 30 tablet 0  . KLOR-CON M20 20 MEQ tablet Take 20 mEq by mouth every morning.     . Lactobacillus (ACIDOPHILUS) CAPS capsule Take 1 capsule by mouth daily.    [name] MAGNESIUM PO Take 400 mg by mouth daily.     . mirtazapine (REMERON) 15 MG tablet Take 1 tablet (15 mg total) by mouth at bedtime. (Patient taking differently: Take 15 mg by mouth at bedtime as needed (sleep). ) 30 tablet 0  . Multiple Vitamin (MULTIVITAMIN WITH MINERALS) TABS Take 1 tablet by mouth daily.    . Nivolumab (OPDIVO IV) Inject as directed every 2 weeks    . Nutritional Supplements (ENSURE COMPLETE PO) Take 1 Can by mouth 2 (two) times daily.    . Omega-3 Fatty Acids (FISH OIL PO) Take 1 tablet by mouth daily.    [name] oseltamivir (TAMIFLU) 75 MG capsule Take 1 capsule (75 mg total) by mouth daily. 9 capsule 0  . pantoprazole (PROTONIX) 40 MG tablet Take 40 mg by mouth every morning.  8  . tamsulosin (FLOMAX) 0.4 MG CAPS capsule Take 0.4 mg by mouth daily at 12 noon.     . thiamine (VITAMIN B-1) 100 MG tablet Take 1 tablet (100 mg total) by mouth daily. 30 tablet 6  . vitamin E 400 UNIT capsule Take 400 Units by mouth daily.     No current facility-administered medications for this visit.    SURGICAL HISTORY:  Past Surgical History  Procedure Laterality Date  . Hemorrhoid surgery    . Ankle arthrodesis  1995    rt fx-hardware in  . Tonsillectomy    . Colonoscopy    . Excision / curettage bone cyst finger  2005    rt thumb    . Total shoulder arthroplasty  02/03/2012    Procedure: TOTAL SHOULDER ARTHROPLASTY;  Surgeon: [name], MD;  Location: [facility];  Service: Orthopedics;  Laterality: Left;  . Cardioversion N/A 09/29/2014    Procedure: CARDIOVERSION;  Surgeon: Josue Hector, MD;  Location: [facility] ENDOSCOPY;  Service: Cardiovascular;  Laterality: N/A;  . Percutaneous cholecytostomy tube      IVR insertion 06-13-15(Dr. Vernard Gambles)- 08-28-15 remains in place to drainage bag  . Ercp N/A 09/02/2015    Procedure: ENDOSCOPIC RETROGRADE CHOLANGIOPANCREATOGRAPHY (ERCP);  Surgeon: Arta Silence, MD;  Location: [facility] ENDOSCOPY;  Service: Endoscopy;  Laterality: N/A;  .  Ercp N/A 09/07/2015    Procedure: ENDOSCOPIC RETROGRADE CHOLANGIOPANCREATOGRAPHY (ERCP);  Surgeon: Clarene Essex, MD;  Location: [facility] ENDOSCOPY;  Service: Endoscopy;  Laterality: N/A;  . Cholecystectomy  09/08/2015     laproscopic   . Cholecystectomy N/A 09/08/2015    Procedure: LAPAROSCOPIC CHOLECYSTECTOMY WITH ATTEMPTED INTRAOPERATIVE CHOLANGIOGRAM ;  Surgeon: Fanny Skates, MD;  Location: [facility];  Service: General;  Laterality: N/A;    REVIEW OF SYSTEMS:  Constitutional: positive for anorexia, fatigue and weight loss Eyes: negative Ears, nose, mouth, throat, and face: negative Respiratory: positive for cough and dyspnea on exertion Cardiovascular: negative Gastrointestinal: negative Genitourinary:negative Integument/breast: negative Hematologic/lymphatic: negative Musculoskeletal:negative Neurological: negative Behavioral/Psych: negative Endocrine: negative Allergic/Immunologic: negative   PHYSICAL EXAMINATION: General appearance: alert, cooperative, fatigued and no distress Head: Normocephalic, without obvious abnormality, atraumatic Neck: no adenopathy, no JVD, supple, symmetrical, trachea midline and thyroid not enlarged, symmetric, no tenderness/mass/nodules Lymph nodes: Cervical, supraclavicular, and axillary nodes normal. Resp:  clear to auscultation bilaterally Back: symmetric, no curvature. ROM normal. No CVA tenderness. Cardio: regular rate and rhythm, S1, S2 normal, no murmur, click, rub or gallop GI: Right upper quadrant tenderness to palpation Extremities: extremities normal, atraumatic, no cyanosis or edema Neurologic: Alert and oriented X 3, normal strength and tone. Normal symmetric reflexes. Normal coordination and gait  ECOG PERFORMANCE STATUS: 1 - Symptomatic but completely ambulatory  Blood pressure 113/56, pulse 101, temperature 98.2 F (36.8 C), temperature source Oral, resp. rate 18, height _0  (1.803 m), weight 153 lb 11.2 oz (69.718 kg), SpO2 96 %.  LABORATORY DATA: Lab Results  Component Value Date   WBC 6.9 01/13/2016   HGB 14.6 01/13/2016   HCT 40.6 01/13/2016   MCV 88.6 01/13/2016   PLT 200 01/13/2016      Chemistry      Component Value Date/Time   NA 131* 01/13/2016 0948   NA 129* 01/07/2016 1844   K 3.4* 01/13/2016 0948   K 4.0 01/07/2016 1844   CL 94* 01/07/2016 1844   CO2 23 01/13/2016 0948   CO2 22 01/07/2016 1844   BUN 10.2 01/13/2016 0948   BUN 16 01/07/2016 1844   CREATININE 0.8 01/13/2016 0948   CREATININE 0.85 01/07/2016 1844      Component Value Date/Time   CALCIUM 9.3 01/13/2016 0948   CALCIUM 8.7* 01/07/2016 1844   ALKPHOS 107 01/13/2016 0948   ALKPHOS 115 01/07/2016 1844   AST 32 01/13/2016 0948   AST 38 01/07/2016 1844   ALT 33 01/13/2016 0948   ALT 22 01/07/2016 1844   BILITOT 1.15 01/13/2016 0948   BILITOT 1.2 01/07/2016 1844       RADIOGRAPHIC STUDIES: Dg Chest 2 View  01/07/2016  CLINICAL DATA:  Dry cough and fever since last night. Patient on chemotherapy for lung carcinoma. EXAM: CHEST  2 VIEW COMPARISON:  CT, 11/09/2015.  Chest radiograph, 09/10/2015. FINDINGS: Cardiac silhouette is normal in size and configuration. No mediastinal or hilar masses or convincing adenopathy. Lungs are hyperexpanded. There are stable areas of lung scarring as  well as changes of emphysema. No evidence of pneumonia or edema. No pleural effusion or pneumothorax. Multiple old right-sided rib fractures. Old right proximal humerus fracture. Left shoulder prosthesis stable. IMPRESSION: 1. No acute cardiopulmonary disease.  No evidence of pneumonia. 2. COPD. Electronically Signed   By: Lajean Manes M.D.   On: 01/07/2016 19:07   Ct Chest W Contrast  01/11/2016  CLINICAL DATA:  Metastatic lung adenocarcinoma diagnosed in October 2014 status post chemotherapy and radiation therapy,  currently on Opdivo, presenting for restaging. EXAM: CT CHEST, ABDOMEN, AND PELVIS WITH CONTRAST TECHNIQUE: Multidetector CT imaging of the chest, abdomen and pelvis was performed following the standard protocol during bolus administration of intravenous contrast. CONTRAST:  OMNIPAQUE IOHEXOL 300 MG/ML  SOLN COMPARISON:  11/09/2015 CT chest, abdomen and pelvis. FINDINGS: CT CHEST Mediastinum/Nodes: Normal heart size. Stable mild pericardial fluid/ thickening. Coronary atherosclerosis. Great vessels are normal in course and caliber. No central pulmonary emboli. Normal visualized thyroid. Normal esophagus. No pathologically enlarged axillary, mediastinal or hilar lymph nodes. Lungs/Pleura: No pneumothorax. No pleural effusion. Moderate centrilobular and mild paraseptal emphysema with diffuse bronchial wall thickening. Apical left upper lobe irregular 1.4 x 0.9 cm pulmonary nodule (series 4/image 10), previously 1.4 x 0.9 cm, unchanged. There are six scattered subsolid left upper lobe pulmonary nodules, largest 2.6 x 1.9 cm (series 4/ image 25), previously 2.6 x 1.9 cm, unchanged. There are six scattered sub solid right upper lobe pulmonary nodules, largest 3.1 x 2.1 cm (series 4/image 25), previously 3.0 x 2.1 cm, not appreciably changed. There is nonspecific patchy reticulation throughout the superior segment right lower lobe, not appreciably changed. There is stable mild scarring at both lower  lobe bases. Stable 3 mm solid anterior right lower lobe pulmonary nodule (series 4/ image 55). No acute consolidative airspace disease or new significant pulmonary nodules. Musculoskeletal: No aggressive appearing focal osseous lesions. Stable healed deformities in the bilateral ribs. Partially visualized left shoulder arthroplasty. Marked degenerative changes in the thoracic spine. Stable asymmetric right gynecomastia. CT ABDOMEN AND PELVIS Hepatobiliary: There are 3 scattered subcentimeter hypodense liver lesions, too small to characterize, unchanged. There is a solitary subcentimeter hyperdense lesion in the anterior liver, too small to characterize, unchanged. No new liver lesions. Cholecystectomy. No biliary ductal dilatation. Pancreas: Normal, with no mass or duct dilation. Spleen: Normal size. No mass. Adrenals/Urinary Tract: No recurrent adrenal nodules. No hydronephrosis. Stable mild renal cortical scarring in the right greater than left kidneys. Hypodense renal cortical lesion in the lower left kidney, too small to characterize, unchanged. Stable small bilateral bladder diverticula and mild bladder distention . Stomach/Bowel: Grossly normal stomach. Normal caliber small bowel with no small bowel wall thickening. Normal appendix . Mild sigmoid diverticulosis, with no large bowel wall thickening or pericolonic fat stranding. Oral contrast progresses to the distal colon. Vascular/Lymphatic: Atherosclerotic nonaneurysmal abdominal aorta. Patent portal, splenic, hepatic and renal veins. No pathologically enlarged lymph nodes in the abdomen or pelvis. Reproductive: Normal size prostate with nonspecific internal prostatic calcification. Other: No pneumoperitoneum, ascites or focal fluid collection. Musculoskeletal: No aggressive appearing focal osseous lesions. Severe degenerative changes in the lumbar spine. IMPRESSION: 1. Interval stability of irregular apical left upper lobe pulmonary nodule and bilateral  subsolid pulmonary nodules. 2. No new or progressive metastatic disease in the chest, abdomen or pelvis. 3. Chronic findings including coronary atherosclerosis, moderate emphysema, asymmetric right gynecomastia, bilateral renal cortical scarring and mild sigmoid diverticulosis. Electronically Signed   By: Delbert Phenix M.D.   On: 01/11/2016 14:51   Ct Abdomen Pelvis W Contrast  01/11/2016  CLINICAL DATA:  Metastatic lung adenocarcinoma diagnosed in October 2014 status post chemotherapy and radiation therapy, currently on Opdivo, presenting for restaging. EXAM: CT CHEST, ABDOMEN, AND PELVIS WITH CONTRAST TECHNIQUE: Multidetector CT imaging of the chest, abdomen and pelvis was performed following the standard protocol during bolus administration of intravenous contrast. CONTRAST:  OMNIPAQUE IOHEXOL 300 MG/ML  SOLN COMPARISON:  11/09/2015 CT chest, abdomen and pelvis. FINDINGS: CT CHEST Mediastinum/Nodes: Normal  heart size. Stable mild pericardial fluid/ thickening. Coronary atherosclerosis. Great vessels are normal in course and caliber. No central pulmonary emboli. Normal visualized thyroid. Normal esophagus. No pathologically enlarged axillary, mediastinal or hilar lymph nodes. Lungs/Pleura: No pneumothorax. No pleural effusion. Moderate centrilobular and mild paraseptal emphysema with diffuse bronchial wall thickening. Apical left upper lobe irregular 1.4 x 0.9 cm pulmonary nodule (series 4/image 10), previously 1.4 x 0.9 cm, unchanged. There are six scattered subsolid left upper lobe pulmonary nodules, largest 2.6 x 1.9 cm (series 4/ image 25), previously 2.6 x 1.9 cm, unchanged. There are six scattered sub solid right upper lobe pulmonary nodules, largest 3.1 x 2.1 cm (series 4/image 25), previously 3.0 x 2.1 cm, not appreciably changed. There is nonspecific patchy reticulation throughout the superior segment right lower lobe, not appreciably changed. There is stable mild scarring at both lower lobe bases.  Stable 3 mm solid anterior right lower lobe pulmonary nodule (series 4/ image 55). No acute consolidative airspace disease or new significant pulmonary nodules. Musculoskeletal: No aggressive appearing focal osseous lesions. Stable healed deformities in the bilateral ribs. Partially visualized left shoulder arthroplasty. Marked degenerative changes in the thoracic spine. Stable asymmetric right gynecomastia. CT ABDOMEN AND PELVIS Hepatobiliary: There are 3 scattered subcentimeter hypodense liver lesions, too small to characterize, unchanged. There is a solitary subcentimeter hyperdense lesion in the anterior liver, too small to characterize, unchanged. No new liver lesions. Cholecystectomy. No biliary ductal dilatation. Pancreas: Normal, with no mass or duct dilation. Spleen: Normal size. No mass. Adrenals/Urinary Tract: No recurrent adrenal nodules. No hydronephrosis. Stable mild renal cortical scarring in the right greater than left kidneys. Hypodense renal cortical lesion in the lower left kidney, too small to characterize, unchanged. Stable small bilateral bladder diverticula and mild bladder distention . Stomach/Bowel: Grossly normal stomach. Normal caliber small bowel with no small bowel wall thickening. Normal appendix . Mild sigmoid diverticulosis, with no large bowel wall thickening or pericolonic fat stranding. Oral contrast progresses to the distal colon. Vascular/Lymphatic: Atherosclerotic nonaneurysmal abdominal aorta. Patent portal, splenic, hepatic and renal veins. No pathologically enlarged lymph nodes in the abdomen or pelvis. Reproductive: Normal size prostate with nonspecific internal prostatic calcification. Other: No pneumoperitoneum, ascites or focal fluid collection. Musculoskeletal: No aggressive appearing focal osseous lesions. Severe degenerative changes in the lumbar spine. IMPRESSION: 1. Interval stability of irregular apical left upper lobe pulmonary nodule and bilateral subsolid  pulmonary nodules. 2. No new or progressive metastatic disease in the chest, abdomen or pelvis. 3. Chronic findings including coronary atherosclerosis, moderate emphysema, asymmetric right gynecomastia, bilateral renal cortical scarring and mild sigmoid diverticulosis. Electronically Signed   By: Ilona Sorrel M.D.   On: 01/11/2016 14:51   ASSESSMENT AND PLAN: This is a very pleasant 75 years old white male with:  1)  Stage IV non-small cell lung cancer, adenocarcinoma status post induction chemotherapy with carboplatin and Alimta and currently on maintenance treatment with single agent Alimta status post 8 cycles. He has been observation for the last few months.  He had evidence for disease progression on the left adrenal gland and the patient underwent palliative radiotherapy to the left adrenal gland under the care of Dr. Sondra Come completed on 12/16/2014. He is currently undergoing treatment with immunotherapy with Nivolumab status post 22 cycles and tolerating his treatment fairly well. His recent CT scan of the chest, abdomen and pelvis showed no evidence for disease progression. The patient is still recovering from a recent flu. I recommended for him to delay the start of cycle #  23 by 1 week. He would come back for follow-up visit in 3 weeks for reevaluation before starting cycle #24.  2) Atrial fibrillation: He'll continue his current medication with Cardizem, digoxin and metoprolol in addition to Eliquis. He'll also continue his routine followup visit with his cardiologist Dr. [name].  He was advised to call immediately if he has any concerning symptoms in the interval. The patient voices understanding of current disease status and treatment options and is in agreement with the current care plan.  All questions were answered. The patient knows to call the clinic with any problems, questions or concerns. We can certainly see the patient much sooner if necessary.  Disclaimer: This note was  dictated with voice recognition software. Similar sounding words can inadvertently be transcribed and may not be corrected upon review.

## 2016-01-18 ENCOUNTER — Ambulatory Visit: Payer: Medicare Other | Admitting: Physical Therapy

## 2016-01-19 ENCOUNTER — Other Ambulatory Visit: Payer: Self-pay | Admitting: Medical Oncology

## 2016-01-19 DIAGNOSIS — C3490 Malignant neoplasm of unspecified part of unspecified bronchus or lung: Secondary | ICD-10-CM

## 2016-01-20 ENCOUNTER — Other Ambulatory Visit (HOSPITAL_BASED_OUTPATIENT_CLINIC_OR_DEPARTMENT_OTHER): Payer: Medicare Other

## 2016-01-20 ENCOUNTER — Ambulatory Visit (HOSPITAL_BASED_OUTPATIENT_CLINIC_OR_DEPARTMENT_OTHER): Payer: Medicare Other

## 2016-01-20 VITALS — BP 147/69 | HR 89 | Temp 98.1°F | Resp 19

## 2016-01-20 DIAGNOSIS — C3431 Malignant neoplasm of lower lobe, right bronchus or lung: Secondary | ICD-10-CM | POA: Diagnosis present

## 2016-01-20 DIAGNOSIS — Z5112 Encounter for antineoplastic immunotherapy: Secondary | ICD-10-CM

## 2016-01-20 DIAGNOSIS — C3490 Malignant neoplasm of unspecified part of unspecified bronchus or lung: Secondary | ICD-10-CM

## 2016-01-20 LAB — COMPREHENSIVE METABOLIC PANEL
ALT: 19 U/L (ref 0–55)
AST: 21 U/L (ref 5–34)
Albumin: 3.9 g/dL (ref 3.5–5.0)
Alkaline Phosphatase: 120 U/L (ref 40–150)
Anion Gap: 10 mEq/L (ref 3–11)
BILIRUBIN TOTAL: 0.74 mg/dL (ref 0.20–1.20)
BUN: 15.5 mg/dL (ref 7.0–26.0)
CHLORIDE: 98 meq/L (ref 98–109)
CO2: 24 meq/L (ref 22–29)
Calcium: 9.5 mg/dL (ref 8.4–10.4)
Creatinine: 0.8 mg/dL (ref 0.7–1.3)
EGFR: 85 mL/min/{1.73_m2} — AB (ref >90)
Glucose: 117 mg/dl (ref 70–140)
Potassium: 4 mEq/L (ref 3.5–5.1)
Sodium: 132 mEq/L — ABNORMAL LOW (ref 136–145)
Total Protein: 6.6 g/dL (ref 6.4–8.3)

## 2016-01-20 LAB — CBC WITH DIFFERENTIAL/PLATELET
BASO%: 1 % (ref 0.0–2.0)
Basophils Absolute: 0.1 10*3/uL (ref 0.0–0.1)
EOS%: 4.8 % (ref 0.0–7.0)
Eosinophils Absolute: 0.3 10*3/uL (ref 0.0–0.5)
HCT: 42.2 % (ref 38.4–49.9)
HGB: 14.4 g/dL (ref 13.0–17.1)
LYMPH%: 22.9 % (ref 14.0–49.0)
MCH: 31.9 pg (ref 27.2–33.4)
MCHC: 34.2 g/dL (ref 32.0–36.0)
MCV: 93.4 fL (ref 79.3–98.0)
MONO#: 0.7 10*3/uL (ref 0.1–0.9)
MONO%: 11.8 % (ref 0.0–14.0)
NEUT#: 3.3 10*3/uL (ref 1.5–6.5)
NEUT%: 59.5 % (ref 39.0–75.0)
Platelets: 359 10*3/uL (ref 140–400)
RBC: 4.52 10*6/uL (ref 4.20–5.82)
RDW: 13.7 % (ref 11.0–14.6)
WBC: 5.6 10*3/uL (ref 4.0–10.3)
lymph#: 1.3 10*3/uL (ref 0.9–3.3)

## 2016-01-20 MED ORDER — SODIUM CHLORIDE 0.9 % IV SOLN
240.0000 mg | Freq: Once | INTRAVENOUS | Status: AC
  Administered 2016-01-20: 240 mg via INTRAVENOUS
  Filled 2016-01-20: qty 20

## 2016-01-20 MED ORDER — SODIUM CHLORIDE 0.9 % IV SOLN
Freq: Once | INTRAVENOUS | Status: AC
  Administered 2016-01-20: 12:00:00 via INTRAVENOUS

## 2016-01-20 NOTE — Patient Instructions (Signed)
[facility] Discharge Instructions for Patients Receiving Chemotherapy  Today you received the following chemotherapy agents:  Nivolumab.  To help prevent nausea and vomiting after your treatment, we encourage you to take your nausea medication as directed.   If you develop nausea and vomiting that is not controlled by your nausea medication, call the clinic.   BELOW ARE SYMPTOMS THAT SHOULD BE REPORTED IMMEDIATELY:  *FEVER GREATER THAN 100.5 F  *CHILLS WITH OR WITHOUT FEVER  NAUSEA AND VOMITING THAT IS NOT CONTROLLED WITH YOUR NAUSEA MEDICATION  *UNUSUAL SHORTNESS OF BREATH  *UNUSUAL BRUISING OR BLEEDING  TENDERNESS IN MOUTH AND THROAT WITH OR WITHOUT PRESENCE OF ULCERS  *URINARY PROBLEMS  *BOWEL PROBLEMS  UNUSUAL RASH Items with * indicate a potential emergency and should be followed up as soon as possible.  Feel free to call the clinic you have any questions or concerns. The clinic phone number is [phone].  Please show the CHEMO ALERT CARD at check-in to the Emergency Department and triage nurse.   

## 2016-01-25 ENCOUNTER — Encounter: Payer: Self-pay | Admitting: Physical Therapy

## 2016-01-25 ENCOUNTER — Ambulatory Visit: Payer: Medicare Other | Attending: Neurology | Admitting: Physical Therapy

## 2016-01-25 DIAGNOSIS — R2681 Unsteadiness on feet: Secondary | ICD-10-CM | POA: Diagnosis not present

## 2016-01-25 DIAGNOSIS — R269 Unspecified abnormalities of gait and mobility: Secondary | ICD-10-CM

## 2016-01-25 NOTE — Therapy (Signed)
[facility] [address] [place], [place], [address] Phone: [phone]   Fax:  [phone]  Physical Therapy Treatment  Patient Details  Name: Anthony Curry MRN: [id number] Date of Birth: 1941-05-27 Referring Provider: Dr. Sarina Ill  Encounter Date: 01/25/2016      PT End of Session - 01/25/16 1952    Visit Number 7   Number of Visits 9   Date for PT Re-Evaluation 02/02/16   Authorization Type Medicare   PT Start Time 0845   PT Stop Time 0930   PT Time Calculation (min) 45 min      Past Medical History  Diagnosis Date  . Hypertension   . GERD (gastroesophageal reflux disease)   . Alcohol abuse   . 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 Low sodium   . Persistent atrial fibrillation ([place]) 04/11/14    chads2vasc score is 2  . Hypotension   . Atrial fibrillation with RVR ([place])     Dr. Allred,cardiology follows LOV 07-30-15  . HCAP (healthcare-associated pneumonia)   . 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 BPH (benign prostatic hyperplasia)   . Hyponatremia   . Anemia of chronic disease   . Fall   . Alcohol intoxication ([place])   . Pulmonary nodules   . Adrenal mass ([place])   . Atrial flutter by electrocardiogram ([place])   . 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 Alcoholism in remission ([place])   . Radiation 12/02/14-12/16/14    Left adrenal gland 30 Gy in 10 fractions  . Dysrhythmia     hx. atrial fibrillation-hx cardiversion- no recent issues- sees Dr. [name]  . Complication of anesthesia 09/08/2015     JITTERY AFTER GALLBLADDER SURGERY  . Anxiety   . Cancer ([place])     small cell/lung  . Neoplastic malignant related fatigue     IV tx. every 2 weeks last9-14-16 (Dr. Earlie Server), radiation last tx. 6 months    Past Surgical History  Procedure Laterality Date  . Hemorrhoid surgery    . Ankle arthrodesis  1995    rt fx-hardware in  . Tonsillectomy    . Colonoscopy    . Excision / curettage  bone cyst finger  2005    rt thumb  . Total shoulder arthroplasty  02/03/2012    Procedure: TOTAL SHOULDER ARTHROPLASTY;  Surgeon: [name], MD;  Location: [facility];  Service: Orthopedics;  Laterality: Left;  . Cardioversion N/A 09/29/2014    Procedure: CARDIOVERSION;  Surgeon: Josue Hector, MD;  Location: [facility] ENDOSCOPY;  Service: Cardiovascular;  Laterality: N/A;  . Percutaneous cholecytostomy tube      IVR insertion 06-13-15(Dr. Vernard Gambles)- 08-28-15 remains in place to drainage bag  . Ercp N/A 09/02/2015    Procedure: ENDOSCOPIC RETROGRADE CHOLANGIOPANCREATOGRAPHY (ERCP);  Surgeon: Arta Silence, MD;  Location: [facility] ENDOSCOPY;  Service: Endoscopy;  Laterality: N/A;  . Ercp N/A 09/07/2015    Procedure: ENDOSCOPIC RETROGRADE CHOLANGIOPANCREATOGRAPHY (ERCP);  Surgeon: Clarene Essex, MD;  Location: [facility] ENDOSCOPY;  Service: Endoscopy;  Laterality: N/A;  . Cholecystectomy  09/08/2015     laproscopic   . Cholecystectomy N/A 09/08/2015    Procedure: LAPAROSCOPIC CHOLECYSTECTOMY WITH ATTEMPTED INTRAOPERATIVE CHOLANGIOGRAM ;  Surgeon: Fanny Skates, MD;  Location: [facility];  Service: General;  Laterality: N/A;    There were no vitals filed for this visit.  Visit Diagnosis:  Abnormality of gait  Unsteadiness      Subjective Assessment - 01/25/16 1945    Subjective Pt states he  has not done exercises very much lately - he had the flu 2 weeks ago and hasn't felt well   Pertinent History lung cancer -diagnosed Nov. 2014; gall bladder removed in Oct. 2016; s/p L TSR 2 yrs ago   Patient Stated Goals Improve balance - be able to walk without use of walker (rollator) or cane   Currently in Pain? No/denies            [facility] PT Assessment - 01/25/16 0001    Berg Balance Test   Sit to Stand Able to stand without using hands and stabilize independently   Standing Unsupported Able to stand safely 2 minutes   Sitting with Back Unsupported but Feet Supported on Floor or Stool Able to sit  safely and securely 2 minutes   Stand to Sit Sits safely with minimal use of hands   Transfers Able to transfer safely, minor use of hands   Standing Unsupported with Eyes Closed Able to stand 10 seconds with supervision   Standing Ubsupported with Feet Together Able to place feet together independently and stand for 1 minute with supervision   From Standing, Reach Forward with Outstretched Arm Can reach confidently >25 cm (10")   From Standing Position, Pick up Object from Floor Able to pick up shoe, needs supervision   From Standing Position, Turn to Look Behind Over each Shoulder Turn sideways only but maintains balance   Turn 360 Degrees Able to turn 360 degrees safely but slowly   Standing Unsupported, Alternately Place Feet on Step/Stool Able to complete 4 steps without aid or supervision   Standing Unsupported, One Foot in Front Able to plae foot ahead of the other independently and hold 30 seconds   Standing on One Leg Tries to lift leg/unable to hold 3 seconds but remains standing independently   Total Score 43                     [facility] Adult PT Treatment/Exercise - 01/25/16 0001    Ambulation/Gait   Gait velocity 2.91   Timed Up and Go Test   TUG Normal TUG   Normal TUG (seconds) 12.8   Knee/Hip Exercises: Aerobic   Stationary Bike Scifit level 2.0 LE's only x 5 minutes for strengthening   Ankle Exercises: Machines for Strengthening   Cybex Leg Press 60# 3 sets 10 reps   Ankle Exercises: Standing   Heel Raises 10 reps     TherEx: pt performed standing hip flexion, extension, and abduction with green theraband x 10 reps each leg LAQ's with green theraband x 10 reps each - in seated position                PT Long Term Goals - 01/25/16 2008    PT LONG TERM GOAL #1   Title Incr. gait velocity to >/= 3.1 ft/sec with use of cane for incr. gait efficiency.  03-03-16   Baseline 2.91 ft/sec with cane on 01-25-16   Status On-going   PT LONG TERM GOAL #2    Title Incr. Berg balance test score to >/= 45/56 to reduce fall risk.  02-02-16/03-03-16   Baseline 43/56 on 01-25-16   Time 4   Period Weeks   Status On-going   PT LONG TERM GOAL #3   Title Independent in HEP for balance exercises. 12-05-15   Status Achieved   PT LONG TERM GOAL #4   Title Perform SOT and establish goal as appropriate.  12-05-15   Baseline SOT  completed but pt unable to maintain balance on majority of conditions   Status Deferred               Plan - 01/25/16 2010    Clinical Impression Statement Pt has improved with Berg score and gait velocity, although goal not completely achieved - both goals were closely approximated   Pt will benefit from skilled therapeutic intervention in order to improve on the following deficits Abnormal gait;Decreased balance;Dizziness;Impaired sensation;Decreased mobility;Decreased coordination;Decreased strength   Rehab Potential Good   PT Frequency 2x / week   PT Duration 4 weeks   PT Treatment/Interventions ADLs/Self Care Home Management;Balance training;Therapeutic exercise;Therapeutic activities;Functional mobility training;Stair training;Gait training;Neuromuscular re-education;Patient/family education;Vestibular   PT Next Visit Plan cont balance and LE strengthening exercises   PT Home Exercise Plan balance and vestibular exercises - added heel raises   Consulted and Agree with Plan of Care Patient        Problem List Patient Active Problem List   Diagnosis Date Noted  . Neuropathy ([place]) 10/15/2015  . Acute urinary retention 09/09/2015  . Choledocholithiasis with cholecystitis 09/08/2015  . Common bile duct stone   . Encounter for antineoplastic immunotherapy 06/28/2015  . Atrial fibrillation ([place]) 06/11/2015  . Gynecomastia, male 06/10/2015  . Dizziness 05/11/2015  . Malignant neoplasm of lower lobe of right lung ([place]) 09/17/2014  . Neutropenic fever ([place]) 09/02/2014  . Neoplastic malignant related fatigue 08/13/2014   . Physical deconditioning 08/13/2014  . Hypoalbuminemia 08/13/2014  . Diarrhea 08/13/2014  . Antineoplastic chemotherapy induced anemia(285.3) 08/13/2014  . Atrial fibrillation with RVR ([place]) 07/28/2014  . HCAP (healthcare-associated pneumonia) 07/25/2014  . Alcoholism in remission ([place]) 07/25/2014  . Atrial flutter by electrocardiogram ([place]) 07/25/2014  . Swelling of limb 06/10/2014  . Lung cancer ([place]) 10/15/2013  . Hypotension 09/10/2013  . Adrenal mass ([place]) 09/09/2013  . 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 Pulmonary nodules 08/19/2013  . Anemia of chronic disease 07/03/2013  . BPH (benign prostatic hyperplasia) 07/03/2013  . GERD (gastroesophageal reflux disease) 07/03/2013  . Fall 07/03/2013  . Alcohol intoxication ([place]) 07/03/2013  . 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 Alcohol abuse 10/15/2011  . Hyponatremia 10/15/2011    [place]ild[place]ness [place], PT 01/25/2016, 8:18 PM  [facility] [address] [place], [place], [address] Phone: [phone]   Fax:  [phone]  Name: Md Smola MRN: [id number] Date of Birth: 1941-01-08

## 2016-01-27 ENCOUNTER — Ambulatory Visit: Payer: Medicare Other

## 2016-01-27 ENCOUNTER — Other Ambulatory Visit: Payer: Medicare Other

## 2016-01-27 ENCOUNTER — Ambulatory Visit: Payer: Medicare Other | Admitting: Internal Medicine

## 2016-02-03 ENCOUNTER — Encounter: Payer: Self-pay | Admitting: Internal Medicine

## 2016-02-03 ENCOUNTER — Ambulatory Visit (HOSPITAL_BASED_OUTPATIENT_CLINIC_OR_DEPARTMENT_OTHER): Payer: Medicare Other

## 2016-02-03 ENCOUNTER — Other Ambulatory Visit (HOSPITAL_BASED_OUTPATIENT_CLINIC_OR_DEPARTMENT_OTHER): Payer: Medicare Other

## 2016-02-03 ENCOUNTER — Telehealth: Payer: Self-pay | Admitting: *Deleted

## 2016-02-03 ENCOUNTER — Telehealth: Payer: Self-pay | Admitting: Internal Medicine

## 2016-02-03 ENCOUNTER — Ambulatory Visit (HOSPITAL_BASED_OUTPATIENT_CLINIC_OR_DEPARTMENT_OTHER): Payer: Medicare Other | Admitting: Internal Medicine

## 2016-02-03 VITALS — BP 148/76 | HR 79 | Temp 97.5°F | Resp 18 | Ht 71.0 in | Wt 153.4 lb

## 2016-02-03 DIAGNOSIS — C3431 Malignant neoplasm of lower lobe, right bronchus or lung: Secondary | ICD-10-CM

## 2016-02-03 DIAGNOSIS — C3491 Malignant neoplasm of unspecified part of right bronchus or lung: Secondary | ICD-10-CM

## 2016-02-03 DIAGNOSIS — Z5112 Encounter for antineoplastic immunotherapy: Secondary | ICD-10-CM | POA: Diagnosis present

## 2016-02-03 DIAGNOSIS — C3490 Malignant neoplasm of unspecified part of unspecified bronchus or lung: Secondary | ICD-10-CM

## 2016-02-03 DIAGNOSIS — I4891 Unspecified atrial fibrillation: Secondary | ICD-10-CM | POA: Diagnosis not present

## 2016-02-03 DIAGNOSIS — C7972 Secondary malignant neoplasm of left adrenal gland: Secondary | ICD-10-CM

## 2016-02-03 LAB — CBC WITH DIFFERENTIAL/PLATELET
BASO%: 1 % (ref 0.0–2.0)
BASOS ABS: 0 10*3/uL (ref 0.0–0.1)
EOS ABS: 0.3 10*3/uL (ref 0.0–0.5)
EOS%: 7.9 % — ABNORMAL HIGH (ref 0.0–7.0)
HEMATOCRIT: 42.7 % (ref 38.4–49.9)
HEMOGLOBIN: 14.5 g/dL (ref 13.0–17.1)
LYMPH#: 1.3 10*3/uL (ref 0.9–3.3)
LYMPH%: 29.5 % (ref 14.0–49.0)
MCH: 31.6 pg (ref 27.2–33.4)
MCHC: 34 g/dL (ref 32.0–36.0)
MCV: 93.1 fL (ref 79.3–98.0)
MONO#: 0.6 10*3/uL (ref 0.1–0.9)
MONO%: 13.3 % (ref 0.0–14.0)
NEUT%: 48.3 % (ref 39.0–75.0)
NEUTROS ABS: 2.1 10*3/uL (ref 1.5–6.5)
Platelets: 229 10*3/uL (ref 140–400)
RBC: 4.59 10*6/uL (ref 4.20–5.82)
RDW: 14 % (ref 11.0–14.6)
WBC: 4.3 10*3/uL (ref 4.0–10.3)

## 2016-02-03 LAB — COMPREHENSIVE METABOLIC PANEL
ALBUMIN: 4 g/dL (ref 3.5–5.0)
ALK PHOS: 103 U/L (ref 40–150)
ALT: 16 U/L (ref 0–55)
AST: 23 U/L (ref 5–34)
Anion Gap: 9 mEq/L (ref 3–11)
BUN: 19.9 mg/dL (ref 7.0–26.0)
CO2: 24 mEq/L (ref 22–29)
Calcium: 9.7 mg/dL (ref 8.4–10.4)
Chloride: 102 mEq/L (ref 98–109)
Creatinine: 0.8 mg/dL (ref 0.7–1.3)
EGFR: 86 mL/min/{1.73_m2} — ABNORMAL LOW (ref >90)
GLUCOSE: 88 mg/dL (ref 70–140)
POTASSIUM: 4.1 meq/L (ref 3.5–5.1)
SODIUM: 134 meq/L — AB (ref 136–145)
Total Bilirubin: 0.88 mg/dL (ref 0.20–1.20)
Total Protein: 6.7 g/dL (ref 6.4–8.3)

## 2016-02-03 LAB — LACTATE DEHYDROGENASE: LDH: 138 U/L (ref 125–245)

## 2016-02-03 MED ORDER — SODIUM CHLORIDE 0.9 % IV SOLN
240.0000 mg | Freq: Once | INTRAVENOUS | Status: AC
  Administered 2016-02-03: 240 mg via INTRAVENOUS
  Filled 2016-02-03: qty 20

## 2016-02-03 MED ORDER — SODIUM CHLORIDE 0.9 % IV SOLN
Freq: Once | INTRAVENOUS | Status: AC
Start: 2016-02-03 — End: 2016-02-03
  Administered 2016-02-03: 13:00:00 via INTRAVENOUS

## 2016-02-03 NOTE — Telephone Encounter (Signed)
Per staff message and POF I have scheduled appts. Advised scheduler of appts. JMW  

## 2016-02-03 NOTE — Patient Instructions (Signed)
[facility] Discharge Instructions for Patients Receiving Chemotherapy  Today you received the following chemotherapy agents Nivolumab.  To help prevent nausea and vomiting after your treatment, we encourage you to take your nausea medication as prescribed.   If you develop nausea and vomiting that is not controlled by your nausea medication, call the clinic.   BELOW ARE SYMPTOMS THAT SHOULD BE REPORTED IMMEDIATELY:  *FEVER GREATER THAN 100.5 F  *CHILLS WITH OR WITHOUT FEVER  NAUSEA AND VOMITING THAT IS NOT CONTROLLED WITH YOUR NAUSEA MEDICATION  *UNUSUAL SHORTNESS OF BREATH  *UNUSUAL BRUISING OR BLEEDING  TENDERNESS IN MOUTH AND THROAT WITH OR WITHOUT PRESENCE OF ULCERS  *URINARY PROBLEMS  *BOWEL PROBLEMS  UNUSUAL RASH Items with * indicate a potential emergency and should be followed up as soon as possible.  Feel free to call the clinic you have any questions or concerns. The clinic phone number is [phone].  Please show the CHEMO ALERT CARD at check-in to the Emergency Department and triage nurse.   

## 2016-02-03 NOTE — Telephone Encounter (Signed)
per pof to sch pt appt-sent MW email to sch trmt-pt to get upated copy b4 leavingtrmt room**

## 2016-02-03 NOTE — Progress Notes (Signed)
Anthony Curry [phone]   Fax:([phone]) [place], [place] Anthony Curry [id number]  DIAGNOSIS: Stage IV (T1 A., N0, M1 B.) non-small cell lung cancer, adenocarcinoma with negative EGFR mutation and negative ALK gene translocation diagnosed in October of 2014.   Molecular profile: Negative for RET, ALK, BRAF, MET, EGFR.  Positive for ERBB2 A775T, [id number], KRAS G13E, MAP2K1 D67N (see full report).   PRIOR THERAPY:  1) Systemic chemotherapy with carboplatin for AUC of 5 and Alimta 500 mg/M2 every 3 weeks, status post 6 cycles with stable disease. First dose on 10/23/2013. 2) Maintenance systemic chemotherapy with single agent Alimta 500 mg/M2 every 3 weeks. Status post 8 cycles.   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 Sondra Come completed on 12/16/2014.  CURRENT THERAPY: Immunotherapy with Nivolumab 3 MG/KG every 2 weeks, status post 23 cycles. First cycle 01/28/2015  CHEMOTHERAPY INTENT: Palliative  CURRENT # OF CHEMOTHERAPY CYCLES: 24 CURRENT ANTIEMETICS: Zofran, Compazine and dexamethasone  CURRENT SMOKING STATUS: Former smoker  ORAL CHEMOTHERAPY AND CONSENT: None  CURRENT BISPHOSPHONATES USE: None  PAIN MANAGEMENT: 0/10  NARCOTICS INDUCED CONSTIPATION: None  LIVING WILL AND CODE STATUS: ?   INTERVAL HISTORY: Anthony Curry 75 y.o. male returns to the clinic today for followup visit accompanied by his wife. The patient is feeling much better today. He has been tolerating his treatment fairly well with no significant adverse effects. He has no fever or chills. He denied having any significant chest pain, shortness of breath, cough or hemoptysis. He denied having any nausea or vomiting. No significant weight loss or night sweats. He has no skin rash or diarrhea. He is here today to start cycle #24 his treatment.   MEDICAL HISTORY: Past Medical  History  Diagnosis Date  . Hypertension   . GERD (gastroesophageal reflux disease)   . Alcohol abuse   . 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 Low sodium   . Persistent atrial fibrillation ([place]) 04/11/14    chads2vasc score is 2  . Hypotension   . Atrial fibrillation with RVR ([place])     Dr. Allred,cardiology follows LOV 07-30-15  . HCAP (healthcare-associated pneumonia)   . 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 BPH (benign prostatic hyperplasia)   . Hyponatremia   . Anemia of chronic disease   . Fall   . Alcohol intoxication ([place])   . Pulmonary nodules   . Adrenal mass ([place])   . Atrial flutter by electrocardiogram ([place])   . 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 Alcoholism in remission ([place])   . Radiation 12/02/14-12/16/14    Left adrenal gland 30 Gy in 10 fractions  . Dysrhythmia     hx. atrial fibrillation-hx cardiversion- no recent issues- sees Dr. [name]  . Complication of anesthesia 09/08/2015     JITTERY AFTER GALLBLADDER SURGERY  . Anxiety   . Cancer ([place])     small cell/lung  . Neoplastic malignant related fatigue     IV tx. every 2 weeks last9-14-16 (Dr. Earlie Server), radiation last tx. 6 months    ALLERGIES:  has No Known Allergies.  MEDICATIONS:  Current Outpatient Prescriptions  Medication Sig Dispense Refill  . ALPRAZolam (XANAX) 0.25 MG tablet Take 0.25 mg by mouth 3 (three) times daily as needed for anxiety or sleep. And also at night for sleep.    [name] apixaban (ELIQUIS) 5 MG TABS tablet Take 5  mg by mouth 2 (two) times daily.    . Ascorbic Acid (VITAMIN C PO) Take 1,000 mg by mouth daily.     . B Complex-C (SUPER B COMPLEX PO) Take 1 each by mouth daily.     . calcium carbonate (OS-CAL) 600 MG TABS Take 600 mg by mouth daily with breakfast.    . demeclocycline (DECLOMYCIN) 150 MG tablet Take 150 mg by mouth 2 (two) times daily.    [name] diltiazem (CARDIZEM CD) 180 MG 24 hr capsule Take 1 capsule (180 mg total) by mouth daily at 12 noon. 90 capsule 3  .  furosemide (LASIX) 40 MG tablet Take 0.5 tablets (20 mg total) by mouth daily. (Patient taking differently: Take 20 mg by mouth every other day. ) 90 tablet 3  . HYDROcodone-acetaminophen (NORCO/VICODIN) 5-325 MG tablet Take 1-2 tablets by mouth every 4 (four) hours as needed for moderate pain. 30 tablet 0  . KLOR-CON M20 20 MEQ tablet Take 20 mEq by mouth every morning.     . Lactobacillus (ACIDOPHILUS) CAPS capsule Take 1 capsule by mouth daily.    [name] MAGNESIUM PO Take 400 mg by mouth daily.     . mirtazapine (REMERON) 15 MG tablet Take 1 tablet (15 mg total) by mouth at bedtime. (Patient taking differently: Take 15 mg by mouth at bedtime as needed (sleep). ) 30 tablet 0  . Multiple Vitamin (MULTIVITAMIN WITH MINERALS) TABS Take 1 tablet by mouth daily.    . Nivolumab (OPDIVO IV) Inject as directed every 2 weeks    . Nutritional Supplements (ENSURE COMPLETE PO) Take 1 Can by mouth 2 (two) times daily.    . Omega-3 Fatty Acids (FISH OIL PO) Take 1 tablet by mouth daily.    . pantoprazole (PROTONIX) 40 MG tablet Take 40 mg by mouth every morning.  8  . tamsulosin (FLOMAX) 0.4 MG CAPS capsule Take 0.4 mg by mouth daily at 12 noon.     . thiamine (VITAMIN B-1) 100 MG tablet Take 1 tablet (100 mg total) by mouth daily. 30 tablet 6  . vitamin E 400 UNIT capsule Take 400 Units by mouth daily.     No current facility-administered medications for this visit.    SURGICAL HISTORY:  Past Surgical History  Procedure Laterality Date  . Hemorrhoid surgery    . Ankle arthrodesis  1995    rt fx-hardware in  . Tonsillectomy    . Colonoscopy    . Excision / curettage bone cyst finger  2005    rt thumb  . Total shoulder arthroplasty  02/03/2012    Procedure: TOTAL SHOULDER ARTHROPLASTY;  Surgeon: [name], MD;  Location: [facility];  Service: Orthopedics;  Laterality: Left;  . Cardioversion N/A 09/29/2014    Procedure: CARDIOVERSION;  Surgeon: Josue Hector, MD;  Location: [facility]  ENDOSCOPY;  Service: Cardiovascular;  Laterality: N/A;  . Percutaneous cholecytostomy tube      IVR insertion 06-13-15(Dr. Vernard Gambles)- 08-28-15 remains in place to drainage bag  . Ercp N/A 09/02/2015    Procedure: ENDOSCOPIC RETROGRADE CHOLANGIOPANCREATOGRAPHY (ERCP);  Surgeon: Arta Silence, MD;  Location: [facility] ENDOSCOPY;  Service: Endoscopy;  Laterality: N/A;  . Ercp N/A 09/07/2015    Procedure: ENDOSCOPIC RETROGRADE CHOLANGIOPANCREATOGRAPHY (ERCP);  Surgeon: Clarene Essex, MD;  Location: [facility] ENDOSCOPY;  Service: Endoscopy;  Laterality: N/A;  . Cholecystectomy  09/08/2015     laproscopic   . Cholecystectomy N/A 09/08/2015    Procedure: LAPAROSCOPIC CHOLECYSTECTOMY WITH ATTEMPTED INTRAOPERATIVE CHOLANGIOGRAM ;  Surgeon: Fanny Skates, MD;  Location: [facility];  Service: General;  Laterality: N/A;    REVIEW OF SYSTEMS:  A comprehensive review of systems was negative.   PHYSICAL EXAMINATION: General appearance: alert, cooperative, fatigued and no distress Head: Normocephalic, without obvious abnormality, atraumatic Neck: no adenopathy, no JVD, supple, symmetrical, trachea midline and thyroid not enlarged, symmetric, no tenderness/mass/nodules Lymph nodes: Cervical, supraclavicular, and axillary nodes normal. Resp: clear to auscultation bilaterally Back: symmetric, no curvature. ROM normal. No CVA tenderness. Cardio: regular rate and rhythm, S1, S2 normal, no murmur, click, rub or gallop GI: Right upper quadrant tenderness to palpation Extremities: extremities normal, atraumatic, no cyanosis or edema Neurologic: Alert and oriented X 3, normal strength and tone. Normal symmetric reflexes. Normal coordination and gait  ECOG PERFORMANCE STATUS: 1 - Symptomatic but completely ambulatory  Blood pressure 148/76, pulse 79, temperature 97.5 F (36.4 C), temperature source Oral, resp. rate 18, height '5\' 11"'$  (1.803 m), weight 153 lb 6.4 oz (69.582 kg), SpO2 98 %.  LABORATORY DATA: Lab Results  Component Value  Date   WBC 4.3 02/03/2016   HGB 14.5 02/03/2016   HCT 42.7 02/03/2016   MCV 93.1 02/03/2016   PLT 229 02/03/2016      Chemistry      Component Value Date/Time   NA 132* 01/20/2016 1049   NA 129* 01/07/2016 1844   K 4.0 01/20/2016 1049   K 4.0 01/07/2016 1844   CL 94* 01/07/2016 1844   CO2 24 01/20/2016 1049   CO2 22 01/07/2016 1844   BUN 15.5 01/20/2016 1049   BUN 16 01/07/2016 1844   CREATININE 0.8 01/20/2016 1049   CREATININE 0.85 01/07/2016 1844      Component Value Date/Time   CALCIUM 9.5 01/20/2016 1049   CALCIUM 8.7* 01/07/2016 1844   ALKPHOS 120 01/20/2016 1049   ALKPHOS 115 01/07/2016 1844   AST 21 01/20/2016 1049   AST 38 01/07/2016 1844   ALT 19 01/20/2016 1049   ALT 22 01/07/2016 1844   BILITOT 0.74 01/20/2016 1049   BILITOT 1.2 01/07/2016 1844       RADIOGRAPHIC STUDIES: Dg Chest 2 View  01/07/2016  CLINICAL DATA:  Dry cough and fever since last night. Patient on chemotherapy for lung carcinoma. EXAM: CHEST  2 VIEW COMPARISON:  CT, 11/09/2015.  Chest radiograph, 09/10/2015. FINDINGS: Cardiac silhouette is normal in size and configuration. No mediastinal or hilar masses or convincing adenopathy. Lungs are hyperexpanded. There are stable areas of lung scarring as well as changes of emphysema. No evidence of pneumonia or edema. No pleural effusion or pneumothorax. Multiple old right-sided rib fractures. Old right proximal humerus fracture. Left shoulder prosthesis stable. IMPRESSION: 1. No acute cardiopulmonary disease.  No evidence of pneumonia. 2. COPD. Electronically Signed   By: Lajean Manes M.D.   On: 01/07/2016 19:07   Ct Chest W Contrast  01/11/2016  CLINICAL DATA:  Metastatic lung adenocarcinoma diagnosed in October 2014 status post chemotherapy and radiation therapy, currently on Opdivo, presenting for restaging. EXAM: CT CHEST, ABDOMEN, AND PELVIS WITH CONTRAST TECHNIQUE: Multidetector CT imaging of the chest, abdomen and pelvis was performed following the  standard protocol during bolus administration of intravenous contrast. CONTRAST:  149m OMNIPAQUE IOHEXOL 300 MG/ML  SOLN COMPARISON:  11/09/2015 CT chest, abdomen and pelvis. FINDINGS: CT CHEST Mediastinum/Nodes: Normal heart size. Stable mild pericardial fluid/ thickening. Coronary atherosclerosis. Great vessels are normal in course and caliber. No central pulmonary emboli. Normal visualized thyroid. Normal esophagus. No pathologically enlarged axillary, mediastinal or  hilar lymph nodes. Lungs/Pleura: No pneumothorax. No pleural effusion. Moderate centrilobular and mild paraseptal emphysema with diffuse bronchial wall thickening. Apical left upper lobe irregular 1.4 x 0.9 cm pulmonary nodule (series 4/image 10), previously 1.4 x 0.9 cm, unchanged. There are six scattered subsolid left upper lobe pulmonary nodules, largest 2.6 x 1.9 cm (series 4/ image 25), previously 2.6 x 1.9 cm, unchanged. There are six scattered sub solid right upper lobe pulmonary nodules, largest 3.1 x 2.1 cm (series 4/image 25), previously 3.0 x 2.1 cm, not appreciably changed. There is nonspecific patchy reticulation throughout the superior segment right lower lobe, not appreciably changed. There is stable mild scarring at both lower lobe bases. Stable 3 mm solid anterior right lower lobe pulmonary nodule (series 4/ image 55). No acute consolidative airspace disease or new significant pulmonary nodules. Musculoskeletal: No aggressive appearing focal osseous lesions. Stable healed deformities in the bilateral ribs. Partially visualized left shoulder arthroplasty. Marked degenerative changes in the thoracic spine. Stable asymmetric right gynecomastia. CT ABDOMEN AND PELVIS Hepatobiliary: There are 3 scattered subcentimeter hypodense liver lesions, too small to characterize, unchanged. There is a solitary subcentimeter hyperdense lesion in the anterior liver, too small to characterize, unchanged. No new liver lesions. Cholecystectomy. No  biliary ductal dilatation. Pancreas: Normal, with no mass or duct dilation. Spleen: Normal size. No mass. Adrenals/Urinary Tract: No recurrent adrenal nodules. No hydronephrosis. Stable mild renal cortical scarring in the right greater than left kidneys. Hypodense renal cortical lesion in the lower left kidney, too small to characterize, unchanged. Stable small bilateral bladder diverticula and mild bladder distention . Stomach/Bowel: Grossly normal stomach. Normal caliber small bowel with no small bowel wall thickening. Normal appendix . Mild sigmoid diverticulosis, with no large bowel wall thickening or pericolonic fat stranding. Oral contrast progresses to the distal colon. Vascular/Lymphatic: Atherosclerotic nonaneurysmal abdominal aorta. Patent portal, splenic, hepatic and renal veins. No pathologically enlarged lymph nodes in the abdomen or pelvis. Reproductive: Normal size prostate with nonspecific internal prostatic calcification. Other: No pneumoperitoneum, ascites or focal fluid collection. Musculoskeletal: No aggressive appearing focal osseous lesions. Severe degenerative changes in the lumbar spine. IMPRESSION: 1. Interval stability of irregular apical left upper lobe pulmonary nodule and bilateral subsolid pulmonary nodules. 2. No new or progressive metastatic disease in the chest, abdomen or pelvis. 3. Chronic findings including coronary atherosclerosis, moderate emphysema, asymmetric right gynecomastia, bilateral renal cortical scarring and mild sigmoid diverticulosis. Electronically Signed   By: Delbert Phenix M.D.   On: 01/11/2016 14:51   Ct Abdomen Pelvis W Contrast  01/11/2016  CLINICAL DATA:  Metastatic lung adenocarcinoma diagnosed in October 2014 status post chemotherapy and radiation therapy, currently on Opdivo, presenting for restaging. EXAM: CT CHEST, ABDOMEN, AND PELVIS WITH CONTRAST TECHNIQUE: Multidetector CT imaging of the chest, abdomen and pelvis was performed following the standard  protocol during bolus administration of intravenous contrast. CONTRAST:  OMNIPAQUE IOHEXOL 300 MG/ML  SOLN COMPARISON:  11/09/2015 CT chest, abdomen and pelvis. FINDINGS: CT CHEST Mediastinum/Nodes: Normal heart size. Stable mild pericardial fluid/ thickening. Coronary atherosclerosis. Great vessels are normal in course and caliber. No central pulmonary emboli. Normal visualized thyroid. Normal esophagus. No pathologically enlarged axillary, mediastinal or hilar lymph nodes. Lungs/Pleura: No pneumothorax. No pleural effusion. Moderate centrilobular and mild paraseptal emphysema with diffuse bronchial wall thickening. Apical left upper lobe irregular 1.4 x 0.9 cm pulmonary nodule (series 4/image 10), previously 1.4 x 0.9 cm, unchanged. There are six scattered subsolid left upper lobe pulmonary nodules, largest 2.6 x 1.9 cm (series 4/ image  25), previously 2.6 x 1.9 cm, unchanged. There are six scattered sub solid right upper lobe pulmonary nodules, largest 3.1 x 2.1 cm (series 4/image 25), previously 3.0 x 2.1 cm, not appreciably changed. There is nonspecific patchy reticulation throughout the superior segment right lower lobe, not appreciably changed. There is stable mild scarring at both lower lobe bases. Stable 3 mm solid anterior right lower lobe pulmonary nodule (series 4/ image 55). No acute consolidative airspace disease or new significant pulmonary nodules. Musculoskeletal: No aggressive appearing focal osseous lesions. Stable healed deformities in the bilateral ribs. Partially visualized left shoulder arthroplasty. Marked degenerative changes in the thoracic spine. Stable asymmetric right gynecomastia. CT ABDOMEN AND PELVIS Hepatobiliary: There are 3 scattered subcentimeter hypodense liver lesions, too small to characterize, unchanged. There is a solitary subcentimeter hyperdense lesion in the anterior liver, too small to characterize, unchanged. No new liver lesions. Cholecystectomy. No biliary ductal  dilatation. Pancreas: Normal, with no mass or duct dilation. Spleen: Normal size. No mass. Adrenals/Urinary Tract: No recurrent adrenal nodules. No hydronephrosis. Stable mild renal cortical scarring in the right greater than left kidneys. Hypodense renal cortical lesion in the lower left kidney, too small to characterize, unchanged. Stable small bilateral bladder diverticula and mild bladder distention . Stomach/Bowel: Grossly normal stomach. Normal caliber small bowel with no small bowel wall thickening. Normal appendix . Mild sigmoid diverticulosis, with no large bowel wall thickening or pericolonic fat stranding. Oral contrast progresses to the distal colon. Vascular/Lymphatic: Atherosclerotic nonaneurysmal abdominal aorta. Patent portal, splenic, hepatic and renal veins. No pathologically enlarged lymph nodes in the abdomen or pelvis. Reproductive: Normal size prostate with nonspecific internal prostatic calcification. Other: No pneumoperitoneum, ascites or focal fluid collection. Musculoskeletal: No aggressive appearing focal osseous lesions. Severe degenerative changes in the lumbar spine. IMPRESSION: 1. Interval stability of irregular apical left upper lobe pulmonary nodule and bilateral subsolid pulmonary nodules. 2. No new or progressive metastatic disease in the chest, abdomen or pelvis. 3. Chronic findings including coronary atherosclerosis, moderate emphysema, asymmetric right gynecomastia, bilateral renal cortical scarring and mild sigmoid diverticulosis. Electronically Signed   By: Ilona Sorrel M.D.   On: 01/11/2016 14:51   ASSESSMENT AND PLAN: This is a very pleasant 75 years old white male with:  1)  Stage IV non-small cell lung cancer, adenocarcinoma status post induction chemotherapy with carboplatin and Alimta and currently on maintenance treatment with single agent Alimta status post 8 cycles. He has been observation for the last few months.  He had evidence for disease progression on the  left adrenal gland and the patient underwent palliative radiotherapy to the left adrenal gland under the care of Dr. Sondra Come completed on 12/16/2014. He is currently undergoing treatment with immunotherapy with Nivolumab status post 23 cycles and tolerating his treatment fairly well. I recommended for him to proceed with cycle #24 today as a scheduled. He would come back for follow-up visit in 2 weeks for reevaluation before starting cycle #25.  2) Atrial fibrillation: He'll continue his current medication with Cardizem, digoxin and metoprolol in addition to Eliquis. He'll also continue his routine followup visit with his cardiologist Dr. [name].  He was advised to call immediately if he has any concerning symptoms in the interval. The patient voices understanding of current disease status and treatment options and is in agreement with the current care plan.  All questions were answered. The patient knows to call the clinic with any problems, questions or concerns. We can certainly see the patient much sooner if necessary.  Disclaimer:  This note was dictated with voice recognition software. Similar sounding words can inadvertently be transcribed and may not be corrected upon review.

## 2016-02-11 ENCOUNTER — Ambulatory Visit: Payer: Medicare Other

## 2016-02-11 ENCOUNTER — Ambulatory Visit: Payer: Medicare Other | Admitting: Internal Medicine

## 2016-02-11 ENCOUNTER — Other Ambulatory Visit: Payer: Medicare Other

## 2016-02-17 ENCOUNTER — Other Ambulatory Visit: Payer: Medicare Other

## 2016-02-17 ENCOUNTER — Ambulatory Visit (HOSPITAL_BASED_OUTPATIENT_CLINIC_OR_DEPARTMENT_OTHER): Payer: Medicare Other | Admitting: Nurse Practitioner

## 2016-02-17 ENCOUNTER — Ambulatory Visit (HOSPITAL_BASED_OUTPATIENT_CLINIC_OR_DEPARTMENT_OTHER): Payer: Medicare Other

## 2016-02-17 ENCOUNTER — Telehealth: Payer: Self-pay | Admitting: Internal Medicine

## 2016-02-17 ENCOUNTER — Other Ambulatory Visit (HOSPITAL_BASED_OUTPATIENT_CLINIC_OR_DEPARTMENT_OTHER): Payer: Medicare Other

## 2016-02-17 VITALS — BP 149/71 | HR 100 | Temp 97.4°F | Resp 18 | Ht 71.0 in | Wt 152.4 lb

## 2016-02-17 DIAGNOSIS — Z5112 Encounter for antineoplastic immunotherapy: Secondary | ICD-10-CM

## 2016-02-17 DIAGNOSIS — C3490 Malignant neoplasm of unspecified part of unspecified bronchus or lung: Secondary | ICD-10-CM

## 2016-02-17 DIAGNOSIS — I4891 Unspecified atrial fibrillation: Secondary | ICD-10-CM | POA: Diagnosis not present

## 2016-02-17 DIAGNOSIS — C3431 Malignant neoplasm of lower lobe, right bronchus or lung: Secondary | ICD-10-CM

## 2016-02-17 DIAGNOSIS — C7972 Secondary malignant neoplasm of left adrenal gland: Secondary | ICD-10-CM

## 2016-02-17 DIAGNOSIS — Z5181 Encounter for therapeutic drug level monitoring: Secondary | ICD-10-CM

## 2016-02-17 DIAGNOSIS — C3491 Malignant neoplasm of unspecified part of right bronchus or lung: Secondary | ICD-10-CM

## 2016-02-17 LAB — CBC WITH DIFFERENTIAL/PLATELET
BASO%: 0.5 % (ref 0.0–2.0)
BASOS ABS: 0 10*3/uL (ref 0.0–0.1)
EOS%: 7 % (ref 0.0–7.0)
Eosinophils Absolute: 0.3 10*3/uL (ref 0.0–0.5)
HEMATOCRIT: 41.9 % (ref 38.4–49.9)
HGB: 15 g/dL (ref 13.0–17.1)
LYMPH#: 1.1 10*3/uL (ref 0.9–3.3)
LYMPH%: 27.5 % (ref 14.0–49.0)
MCH: 32.3 pg (ref 27.2–33.4)
MCHC: 35.8 g/dL (ref 32.0–36.0)
MCV: 90.1 fL (ref 79.3–98.0)
MONO#: 0.5 10*3/uL (ref 0.1–0.9)
MONO%: 11.8 % (ref 0.0–14.0)
NEUT#: 2.2 10*3/uL (ref 1.5–6.5)
NEUT%: 53.2 % (ref 39.0–75.0)
PLATELETS: 232 10*3/uL (ref 140–400)
RBC: 4.65 10*6/uL (ref 4.20–5.82)
RDW: 14.6 % (ref 11.0–14.6)
WBC: 4.2 10*3/uL (ref 4.0–10.3)

## 2016-02-17 LAB — COMPREHENSIVE METABOLIC PANEL
ALBUMIN: 4.2 g/dL (ref 3.5–5.0)
ALK PHOS: 107 U/L (ref 40–150)
ALT: 17 U/L (ref 0–55)
AST: 24 U/L (ref 5–34)
Anion Gap: 9 mEq/L (ref 3–11)
BILIRUBIN TOTAL: 0.9 mg/dL (ref 0.20–1.20)
BUN: 11.4 mg/dL (ref 7.0–26.0)
CALCIUM: 9.6 mg/dL (ref 8.4–10.4)
CO2: 24 mEq/L (ref 22–29)
CREATININE: 0.8 mg/dL (ref 0.7–1.3)
Chloride: 101 mEq/L (ref 98–109)
EGFR: 85 mL/min/{1.73_m2} — ABNORMAL LOW (ref >90)
Glucose: 122 mg/dl (ref 70–140)
Potassium: 3.8 mEq/L (ref 3.5–5.1)
Sodium: 134 mEq/L — ABNORMAL LOW (ref 136–145)
TOTAL PROTEIN: 7 g/dL (ref 6.4–8.3)

## 2016-02-17 LAB — LACTATE DEHYDROGENASE: LDH: 146 U/L (ref 125–245)

## 2016-02-17 MED ORDER — NIVOLUMAB CHEMO INJECTION 100 MG/10ML
240.0000 mg | Freq: Once | INTRAVENOUS | Status: AC
  Administered 2016-02-17: 240 mg via INTRAVENOUS
  Filled 2016-02-17: qty 20

## 2016-02-17 MED ORDER — SODIUM CHLORIDE 0.9 % IV SOLN
Freq: Once | INTRAVENOUS | Status: AC
  Administered 2016-02-17: 13:00:00 via INTRAVENOUS

## 2016-02-17 NOTE — Progress Notes (Signed)
  [facility] OFFICE PROGRESS NOTE   DIAGNOSIS: Stage IV (T1 A., N0, M1 B.) non-small cell lung cancer, adenocarcinoma with negative EGFR mutation and negative ALK gene translocation diagnosed in October of 2014.   Molecular profile: Negative for RET, ALK, BRAF, MET, EGFR.  Positive for ERBB2 A775T, [id number], KRAS G13E, MAP2K1 D67N (see full report).   PRIOR THERAPY:  1) Systemic chemotherapy with carboplatin for AUC of 5 and Alimta 500 mg/M2 every 3 weeks, status post 6 cycles with stable disease. First dose on 10/23/2013. 2) Maintenance systemic chemotherapy with single agent Alimta 500 mg/M2 every 3 weeks. Status post 8 cycles.  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 Sondra Come completed on 12/16/2014.  CURRENT THERAPY: Immunotherapy with Nivolumab 3 MG/KG every 2 weeks, status post 24 cycles. First cycle 01/28/2015.   INTERVAL HISTORY:   Anthony Curry returns as scheduled. He continues every 2 week nivolumab. He denies nausea/vomiting. No mouth sores. He has occasional loose stools. No rash. No shortness of breath. No cough or fever.  Objective:  Vital signs in last 24 hours:  Blood pressure 149/71, pulse 100, temperature 97.4 F (36.3 C), temperature source Oral, resp. rate 18, height '5\' 11"'$  (1.803 m), weight 152 lb 6.4 oz (69.128 kg), SpO2 99 %.    HEENT: No thrush or ulcers. Resp: Lungs clear bilaterally. Cardio: Regular rate and rhythm. GI: Abdomen soft and nontender. No hepatomegaly. Vascular: Right lower leg/ankle is slightly larger than the left lower leg (patient reports this is chronic due to a previous ankle fracture). Calves soft and nontender. Skin: No rash.    Lab Results:  Lab Results  Component Value Date   WBC 4.2 02/17/2016   HGB 15.0 02/17/2016   HCT 41.9 02/17/2016   MCV 90.1 02/17/2016   PLT 232 02/17/2016   NEUTROABS 2.2 02/17/2016    Imaging:  No results found.  Medications: I  have reviewed the patient's current medications.  Assessment/Plan: 1. Stage IV non-small cell lung cancer, adenocarcinoma; currently on active treatment with nivolumab. He has completed 24 cycles. 2. Atrial fibrillation.   Disposition: Anthony Curry appears stable. He has completed 24 cycles of nivolumab. Plan to proceed with cycle 25 today as scheduled. He will return for a follow-up visit and nivolumab in 2 weeks. He will contact the office in the interim with any problems.    Ned Card ANP/GNP-BC   02/17/2016  12:16 PM

## 2016-02-17 NOTE — Patient Instructions (Signed)
[facility] Discharge Instructions for Patients Receiving Chemotherapy  Today you received the following chemotherapy agents Nivolumab.  To help prevent nausea and vomiting after your treatment, we encourage you to take your nausea medication as prescribed.   If you develop nausea and vomiting that is not controlled by your nausea medication, call the clinic.   BELOW ARE SYMPTOMS THAT SHOULD BE REPORTED IMMEDIATELY:  *FEVER GREATER THAN 100.5 F  *CHILLS WITH OR WITHOUT FEVER  NAUSEA AND VOMITING THAT IS NOT CONTROLLED WITH YOUR NAUSEA MEDICATION  *UNUSUAL SHORTNESS OF BREATH  *UNUSUAL BRUISING OR BLEEDING  TENDERNESS IN MOUTH AND THROAT WITH OR WITHOUT PRESENCE OF ULCERS  *URINARY PROBLEMS  *BOWEL PROBLEMS  UNUSUAL RASH Items with * indicate a potential emergency and should be followed up as soon as possible.  Feel free to call the clinic you have any questions or concerns. The clinic phone number is [phone].  Please show the CHEMO ALERT CARD at check-in to the Emergency Department and triage nurse.   

## 2016-02-17 NOTE — Telephone Encounter (Signed)
perp of to CX 4/12 appts-appts cancelled

## 2016-02-22 ENCOUNTER — Ambulatory Visit: Payer: Medicare Other | Admitting: Physical Therapy

## 2016-03-02 ENCOUNTER — Encounter: Payer: Self-pay | Admitting: Internal Medicine

## 2016-03-02 ENCOUNTER — Other Ambulatory Visit (HOSPITAL_BASED_OUTPATIENT_CLINIC_OR_DEPARTMENT_OTHER): Payer: Medicare Other

## 2016-03-02 ENCOUNTER — Ambulatory Visit (HOSPITAL_BASED_OUTPATIENT_CLINIC_OR_DEPARTMENT_OTHER): Payer: Medicare Other | Admitting: Internal Medicine

## 2016-03-02 ENCOUNTER — Ambulatory Visit (HOSPITAL_BASED_OUTPATIENT_CLINIC_OR_DEPARTMENT_OTHER): Payer: Medicare Other

## 2016-03-02 VITALS — BP 145/65 | HR 73 | Temp 97.7°F | Resp 17 | Ht 71.0 in | Wt 156.4 lb

## 2016-03-02 DIAGNOSIS — C3431 Malignant neoplasm of lower lobe, right bronchus or lung: Secondary | ICD-10-CM

## 2016-03-02 DIAGNOSIS — Z5111 Encounter for antineoplastic chemotherapy: Secondary | ICD-10-CM | POA: Diagnosis present

## 2016-03-02 DIAGNOSIS — C7972 Secondary malignant neoplasm of left adrenal gland: Secondary | ICD-10-CM | POA: Diagnosis not present

## 2016-03-02 DIAGNOSIS — C3490 Malignant neoplasm of unspecified part of unspecified bronchus or lung: Secondary | ICD-10-CM | POA: Diagnosis not present

## 2016-03-02 DIAGNOSIS — C3491 Malignant neoplasm of unspecified part of right bronchus or lung: Secondary | ICD-10-CM

## 2016-03-02 DIAGNOSIS — R530 Neoplastic (malignant) related fatigue: Secondary | ICD-10-CM

## 2016-03-02 DIAGNOSIS — I4891 Unspecified atrial fibrillation: Secondary | ICD-10-CM

## 2016-03-02 DIAGNOSIS — Z5112 Encounter for antineoplastic immunotherapy: Secondary | ICD-10-CM

## 2016-03-02 LAB — CBC WITH DIFFERENTIAL/PLATELET
BASO%: 0.4 % (ref 0.0–2.0)
Basophils Absolute: 0 10*3/uL (ref 0.0–0.1)
EOS ABS: 0.1 10*3/uL (ref 0.0–0.5)
EOS%: 3.4 % (ref 0.0–7.0)
HEMATOCRIT: 39.8 % (ref 38.4–49.9)
HEMOGLOBIN: 13.4 g/dL (ref 13.0–17.1)
LYMPH%: 21.1 % (ref 14.0–49.0)
MCH: 31.6 pg (ref 27.2–33.4)
MCHC: 33.8 g/dL (ref 32.0–36.0)
MCV: 93.6 fL (ref 79.3–98.0)
MONO#: 0.3 10*3/uL (ref 0.1–0.9)
MONO%: 8.1 % (ref 0.0–14.0)
NEUT%: 67 % (ref 39.0–75.0)
NEUTROS ABS: 2.6 10*3/uL (ref 1.5–6.5)
Platelets: 208 10*3/uL (ref 140–400)
RBC: 4.25 10*6/uL (ref 4.20–5.82)
RDW: 14.7 % — AB (ref 11.0–14.6)
WBC: 3.8 10*3/uL — AB (ref 4.0–10.3)
lymph#: 0.8 10*3/uL — ABNORMAL LOW (ref 0.9–3.3)

## 2016-03-02 LAB — COMPREHENSIVE METABOLIC PANEL
ALBUMIN: 3.7 g/dL (ref 3.5–5.0)
ALK PHOS: 84 U/L (ref 40–150)
ALT: 19 U/L (ref 0–55)
AST: 29 U/L (ref 5–34)
Anion Gap: 9 mEq/L (ref 3–11)
BILIRUBIN TOTAL: 0.65 mg/dL (ref 0.20–1.20)
BUN: 13.8 mg/dL (ref 7.0–26.0)
CALCIUM: 9 mg/dL (ref 8.4–10.4)
CO2: 24 mEq/L (ref 22–29)
Chloride: 98 mEq/L (ref 98–109)
Creatinine: 0.8 mg/dL (ref 0.7–1.3)
EGFR: 86 mL/min/{1.73_m2} — AB (ref >90)
GLUCOSE: 125 mg/dL (ref 70–140)
Potassium: 3.8 mEq/L (ref 3.5–5.1)
SODIUM: 131 meq/L — AB (ref 136–145)
TOTAL PROTEIN: 6.2 g/dL — AB (ref 6.4–8.3)

## 2016-03-02 LAB — LACTATE DEHYDROGENASE: LDH: 181 U/L (ref 125–245)

## 2016-03-02 MED ORDER — SODIUM CHLORIDE 0.9 % IV SOLN
240.0000 mg | Freq: Once | INTRAVENOUS | Status: AC
  Administered 2016-03-02: 240 mg via INTRAVENOUS
  Filled 2016-03-02: qty 20

## 2016-03-02 MED ORDER — SODIUM CHLORIDE 0.9 % IV SOLN
Freq: Once | INTRAVENOUS | Status: AC
  Administered 2016-03-02: 13:00:00 via INTRAVENOUS

## 2016-03-02 NOTE — Progress Notes (Addendum)
[place] [phone]   Fax:([phone]) [place], [place] [address]  DIAGNOSIS: Stage IV (T1 A., N0, M1 B.) non-small cell lung cancer of the right lower lobe, adenocarcinoma with negative EGFR mutation and negative ALK gene translocation diagnosed in October of 2014.   Molecular profile: Negative for RET, ALK, BRAF, MET, EGFR.  Positive for ERBB2 A775T, LOV5I433I, KRAS G13E, MAP2K1 D67N (see full report).   PRIOR THERAPY:  1) Systemic chemotherapy with carboplatin for AUC of 5 and Alimta 500 mg/M2 every 3 weeks, status post 6 cycles with stable disease. First dose on 10/23/2013. 2) Maintenance systemic chemotherapy with single agent Alimta 500 mg/M2 every 3 weeks. Status post 8 cycles.   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 Sondra Come completed on 12/16/2014.  CURRENT THERAPY: Immunotherapy with Nivolumab 3 MG/KG every 2 weeks, status post 24 cycles. First cycle 01/28/2015  CHEMOTHERAPY INTENT: Palliative  CURRENT # OF CHEMOTHERAPY CYCLES: 25 CURRENT ANTIEMETICS: Zofran, Compazine and dexamethasone  CURRENT SMOKING STATUS: Former smoker  ORAL CHEMOTHERAPY AND CONSENT: None  CURRENT BISPHOSPHONATES USE: None  PAIN MANAGEMENT: 0/10  NARCOTICS INDUCED CONSTIPATION: None  LIVING WILL AND CODE STATUS: ?   INTERVAL HISTORY: Anthony Curry 75 y.o. male returns to the clinic today for followup visit accompanied by his wife. The patient is feeling much better today. He has been tolerating his treatment fairly well with no significant adverse effects. He has no fever or chills. He denied having any significant chest pain, shortness of breath, cough or hemoptysis. He denied having any nausea or vomiting. No significant weight loss or night sweats. He has no skin rash or diarrhea. He is here today to start cycle #25 his treatment. The patient  his wife has plans to go to [place] for the next few weeks and he would like to delay the start of cycle #26 by 2 more weeks until he comes back from his vacation.[name]   MEDICAL HISTORY: Past Medical History  Diagnosis Date  . Hypertension   . GERD (gastroesophageal reflux disease)   . Alcohol abuse   . 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 Low sodium   . Persistent atrial fibrillation ([place]) 04/11/14    chads2vasc score is 2  . Hypotension   . Atrial fibrillation with RVR ([place])     Dr. Allred,cardiology follows LOV 07-30-15  . HCAP (healthcare-associated pneumonia)   . 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 BPH (benign prostatic hyperplasia)   . Hyponatremia   . Anemia of chronic disease   . Fall   . Alcohol intoxication ([place])   . Pulmonary nodules   . Adrenal mass ([place])   . Atrial flutter by electrocardiogram ([place])   . 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 Alcoholism in remission ([place])   . Radiation 12/02/14-12/16/14    Left adrenal gland 30 Gy in 10 fractions  . Dysrhythmia     hx. atrial fibrillation-hx cardiversion- no recent issues- sees Dr. [name]  . Complication of anesthesia 09/08/2015     JITTERY AFTER GALLBLADDER SURGERY  . Anxiety   . Cancer ([place])     small cell/lung  . Neoplastic malignant related fatigue     IV tx. every 2 weeks last9-14-16 (Dr. Earlie Server), radiation last tx. 6 months    ALLERGIES:  has No Known Allergies.  MEDICATIONS:  Current Outpatient Prescriptions  Medication Sig  Dispense Refill  . ALPRAZolam (XANAX) 0.25 MG tablet Take 0.25 mg by mouth 3 (three) times daily as needed for anxiety or sleep. And also at night for sleep.    [name] apixaban (ELIQUIS) 5 MG TABS tablet Take 5 mg by mouth 2 (two) times daily.    . Ascorbic Acid (VITAMIN C PO) Take 1,000 mg by mouth daily.     . B Complex-C (SUPER B COMPLEX PO) Take 1 each by mouth daily.     . calcium carbonate (OS-CAL) 600 MG TABS Take 600 mg by mouth daily with breakfast.    .  demeclocycline (DECLOMYCIN) 150 MG tablet Take 150 mg by mouth 2 (two) times daily.    [name] diltiazem (CARDIZEM CD) 180 MG 24 hr capsule Take 1 capsule (180 mg total) by mouth daily at 12 noon. 90 capsule 3  . furosemide (LASIX) 40 MG tablet Take 0.5 tablets (20 mg total) by mouth daily. (Patient taking differently: Take 20 mg by mouth every other day. ) 90 tablet 3  . HYDROcodone-acetaminophen (NORCO/VICODIN) 5-325 MG tablet Take 1-2 tablets by mouth every 4 (four) hours as needed for moderate pain. 30 tablet 0  . KLOR-CON M20 20 MEQ tablet Take 20 mEq by mouth every morning.     . Lactobacillus (ACIDOPHILUS) CAPS capsule Take 1 capsule by mouth daily.    [name] MAGNESIUM PO Take 400 mg by mouth daily.     . mirtazapine (REMERON) 15 MG tablet Take 1 tablet (15 mg total) by mouth at bedtime. (Patient taking differently: Take 15 mg by mouth at bedtime as needed (sleep). ) 30 tablet 0  . Multiple Vitamin (MULTIVITAMIN WITH MINERALS) TABS Take 1 tablet by mouth daily.    . Nivolumab (OPDIVO IV) Inject as directed every 2 weeks    . Nutritional Supplements (ENSURE COMPLETE PO) Take 1 Can by mouth 2 (two) times daily.    . Omega-3 Fatty Acids (FISH OIL PO) Take 1 tablet by mouth daily.    . pantoprazole (PROTONIX) 40 MG tablet Take 40 mg by mouth every morning.  8  . tamsulosin (FLOMAX) 0.4 MG CAPS capsule Take 0.4 mg by mouth daily at 12 noon.     . thiamine (VITAMIN B-1) 100 MG tablet Take 1 tablet (100 mg total) by mouth daily. 30 tablet 6  . vitamin E 400 UNIT capsule Take 400 Units by mouth daily.     No current facility-administered medications for this visit.    SURGICAL HISTORY:  Past Surgical History  Procedure Laterality Date  . Hemorrhoid surgery    . Ankle arthrodesis  1995    rt fx-hardware in  . Tonsillectomy    . Colonoscopy    . Excision / curettage bone cyst finger  2005    rt thumb  . Total shoulder arthroplasty  02/03/2012    Procedure: TOTAL SHOULDER ARTHROPLASTY;  Surgeon: [name], MD;  Location: No Name;  Service: Orthopedics;  Laterality: Left;  . Cardioversion N/A 09/29/2014    Procedure: CARDIOVERSION;  Surgeon: Josue Hector, MD;  Location: [facility] ENDOSCOPY;  Service: Cardiovascular;  Laterality: N/A;  . Percutaneous cholecytostomy tube      IVR insertion 06-13-15(Dr. Vernard Gambles)- 08-28-15 remains in place to drainage bag  . Ercp N/A 09/02/2015    Procedure: ENDOSCOPIC RETROGRADE CHOLANGIOPANCREATOGRAPHY (ERCP);  Surgeon: Arta Silence, MD;  Location: [facility];  Service: Endoscopy;  Laterality: N/A;  . Ercp N/A 09/07/2015    Procedure: ENDOSCOPIC RETROGRADE CHOLANGIOPANCREATOGRAPHY (ERCP);  Surgeon: Clarene Essex, MD;  Location: [facility] ENDOS[facility];  Service: Endoscopy;  Laterality: N/A;  . Cholecystectomy  09/08/2015     laproscopic   . Cholecystectomy N/A 09/08/2015    Procedure: LAPAROSCOPIC CHOLECYSTECTOMY WITH ATTEMPTED INTRAOPERATIVE CHOLANGIOGRAM ;  Surgeon: Fanny Skates, MD;  Location: [place];  Service: General;  Laterality: N/A;    REVIEW OF SYSTEMS:  A comprehensive review of systems was negative.   PHYSICAL EXAMINATION: General appearance: alert, cooperative, fatigued and no distress Head: Normocephalic, without obvious abnormality, atraumatic Neck: no adenopathy, no JVD, supple, symmetrical, trachea midline and thyroid not enlarged, symmetric, no tenderness/mass/nodules Lymph nodes: Cervical, supraclavicular, and axillary nodes normal. Resp: clear to auscultation bilaterally Back: symmetric, no curvature. ROM normal. No CVA tenderness. Cardio: regular rate and rhythm, S1, S2 normal, no murmur, click, rub or gallop GI: Right upper quadrant tenderness to palpation Extremities: extremities normal, atraumatic, no cyanosis or edema Neurologic: Alert and oriented X 3, normal strength and tone. Normal symmetric reflexes. Normal coordination and gait  ECOG PERFORMANCE STATUS: 1 - Symptomatic but completely ambulatory  Blood pressure 145/65,  pulse 73, temperature 97.7 F (36.5 C), temperature source Oral, resp. rate 17, height 5' 11"  (1.803 m), weight 156 lb 6.4 oz (70.943 kg), SpO2 97 %.  LABORATORY DATA: Lab Results  Component Value Date   WBC 3.8* 03/02/2016   HGB 13.4 03/02/2016   HCT 39.8 03/02/2016   MCV 93.6 03/02/2016   PLT 208 03/02/2016      Chemistry      Component Value Date/Time   NA 131* 03/02/2016 1143   NA 129* 01/07/2016 1844   K 3.8 03/02/2016 1143   K 4.0 01/07/2016 1844   CL 94* 01/07/2016 1844   CO2 24 03/02/2016 1143   CO2 22 01/07/2016 1844   BUN 13.8 03/02/2016 1143   BUN 16 01/07/2016 1844   CREATININE 0.8 03/02/2016 1143   CREATININE 0.85 01/07/2016 1844      Component Value Date/Time   CALCIUM 9.0 03/02/2016 1143   CALCIUM 8.7* 01/07/2016 1844   ALKPHOS 84 03/02/2016 1143   ALKPHOS 115 01/07/2016 1844   AST 29 03/02/2016 1143   AST 38 01/07/2016 1844   ALT 19 03/02/2016 1143   ALT 22 01/07/2016 1844   BILITOT 0.65 03/02/2016 1143   BILITOT 1.2 01/07/2016 1844       RADIOGRAPHIC STUDIES: No results found. ASSESSMENT AND PLAN: This is a very pleasant 75 years old white male with:  1)  Stage IV non-small cell lung cancer, adenocarcinoma status post induction chemotherapy with carboplatin and Alimta and currently on maintenance treatment with single agent Alimta status post 8 cycles. He has been observation for the last few months.  He had evidence for disease progression on the left adrenal gland and the patient underwent palliative radiotherapy to the left adrenal gland under the care of Dr. Sondra Come completed on 12/16/2014. He is currently undergoing treatment with immunotherapy with Nivolumab status post 24 cycles and tolerating his treatment fairly well. I recommended for him to proceed with cycle #25 today as a scheduled. He would come back for follow-up visit in 4 weeks for reevaluation before starting cycle #26 after repeating CT scan of the chest, abdomen and pelvis for  restaging of his disease. This will involve a delay of cycle #26 by 2 weeks until he comes back from his vacation in [place].  2) Atrial fibrillation: He'll continue his current medication with Cardizem, digoxin and metoprolol in addition to Eliquis. He'll also continue his  routine followup visit with his cardiologist Dr. [name].  He was advised to call immediately if he has any concerning symptoms in the interval. The patient voices understanding of current disease status and treatment options and is in agreement with the current care plan.  All questions were answered. The patient knows to call the clinic with any problems, questions or concerns. We can certainly see the patient much sooner if necessary.  Disclaimer: This note was dictated with voice recognition software. Similar sounding words can inadvertently be transcribed and may not be corrected upon review.

## 2016-03-02 NOTE — Patient Instructions (Signed)
[facility] Discharge Instructions for Patients Receiving Chemotherapy  Today you received the following chemotherapy agents Nivolumab.  To help prevent nausea and vomiting after your treatment, we encourage you to take your nausea medication as prescribed.   If you develop nausea and vomiting that is not controlled by your nausea medication, call the clinic.   BELOW ARE SYMPTOMS THAT SHOULD BE REPORTED IMMEDIATELY:  *FEVER GREATER THAN 100.5 F  *CHILLS WITH OR WITHOUT FEVER  NAUSEA AND VOMITING THAT IS NOT CONTROLLED WITH YOUR NAUSEA MEDICATION  *UNUSUAL SHORTNESS OF BREATH  *UNUSUAL BRUISING OR BLEEDING  TENDERNESS IN MOUTH AND THROAT WITH OR WITHOUT PRESENCE OF ULCERS  *URINARY PROBLEMS  *BOWEL PROBLEMS  UNUSUAL RASH Items with * indicate a potential emergency and should be followed up as soon as possible.  Feel free to call the clinic you have any questions or concerns. The clinic phone number is [phone].  Please show the CHEMO ALERT CARD at check-in to the Emergency Department and triage nurse.   

## 2016-03-16 ENCOUNTER — Ambulatory Visit: Payer: Medicare Other | Admitting: Internal Medicine

## 2016-03-16 ENCOUNTER — Ambulatory Visit: Payer: Medicare Other

## 2016-03-16 ENCOUNTER — Other Ambulatory Visit: Payer: Medicare Other

## 2016-03-21 ENCOUNTER — Encounter: Payer: Self-pay | Admitting: Nurse Practitioner

## 2016-03-29 ENCOUNTER — Ambulatory Visit (HOSPITAL_COMMUNITY)
Admission: RE | Admit: 2016-03-29 | Discharge: 2016-03-29 | Disposition: A | Discharge status: Home / Self Care | Payer: Medicare Other | Source: Ambulatory Visit | Attending: Internal Medicine | Admitting: Internal Medicine

## 2016-03-29 ENCOUNTER — Encounter (HOSPITAL_COMMUNITY): Payer: Self-pay

## 2016-03-29 DIAGNOSIS — Z9225 Personal history of immunosupression therapy: Secondary | ICD-10-CM | POA: Insufficient documentation

## 2016-03-29 DIAGNOSIS — N62 Hypertrophy of breast: Secondary | ICD-10-CM | POA: Insufficient documentation

## 2016-03-29 DIAGNOSIS — N323 Diverticulum of bladder: Secondary | ICD-10-CM | POA: Insufficient documentation

## 2016-03-29 DIAGNOSIS — R530 Neoplastic (malignant) related fatigue: Secondary | ICD-10-CM | POA: Diagnosis not present

## 2016-03-29 DIAGNOSIS — I2510 Atherosclerotic heart disease of native coronary artery without angina pectoris: Secondary | ICD-10-CM | POA: Diagnosis not present

## 2016-03-29 DIAGNOSIS — K5730 Diverticulosis of large intestine without perforation or abscess without bleeding: Secondary | ICD-10-CM | POA: Diagnosis not present

## 2016-03-29 DIAGNOSIS — Z5112 Encounter for antineoplastic immunotherapy: Secondary | ICD-10-CM

## 2016-03-29 DIAGNOSIS — R918 Other nonspecific abnormal finding of lung field: Secondary | ICD-10-CM | POA: Diagnosis not present

## 2016-03-29 DIAGNOSIS — I7090 Unspecified atherosclerosis: Secondary | ICD-10-CM | POA: Insufficient documentation

## 2016-03-29 DIAGNOSIS — C3431 Malignant neoplasm of lower lobe, right bronchus or lung: Secondary | ICD-10-CM | POA: Insufficient documentation

## 2016-03-29 MED ORDER — IOPAMIDOL (ISOVUE-300) INJECTION 61%
100.0000 mL | Freq: Once | INTRAVENOUS | Status: AC | PRN
  Administered 2016-03-29: 100 mL via INTRAVENOUS

## 2016-03-30 ENCOUNTER — Ambulatory Visit (HOSPITAL_BASED_OUTPATIENT_CLINIC_OR_DEPARTMENT_OTHER): Payer: Medicare Other | Admitting: Internal Medicine

## 2016-03-30 ENCOUNTER — Encounter: Payer: Self-pay | Admitting: Internal Medicine

## 2016-03-30 ENCOUNTER — Ambulatory Visit (HOSPITAL_BASED_OUTPATIENT_CLINIC_OR_DEPARTMENT_OTHER): Payer: Medicare Other

## 2016-03-30 ENCOUNTER — Encounter: Payer: Self-pay | Admitting: *Deleted

## 2016-03-30 ENCOUNTER — Other Ambulatory Visit: Payer: Self-pay | Admitting: Medical Oncology

## 2016-03-30 ENCOUNTER — Other Ambulatory Visit (HOSPITAL_BASED_OUTPATIENT_CLINIC_OR_DEPARTMENT_OTHER): Payer: Medicare Other

## 2016-03-30 VITALS — BP 138/69 | HR 82 | Temp 97.9°F | Resp 18 | Ht 71.0 in | Wt 154.5 lb

## 2016-03-30 DIAGNOSIS — C3431 Malignant neoplasm of lower lobe, right bronchus or lung: Secondary | ICD-10-CM

## 2016-03-30 DIAGNOSIS — I4891 Unspecified atrial fibrillation: Secondary | ICD-10-CM

## 2016-03-30 DIAGNOSIS — Z79899 Other long term (current) drug therapy: Secondary | ICD-10-CM

## 2016-03-30 DIAGNOSIS — Z5112 Encounter for antineoplastic immunotherapy: Secondary | ICD-10-CM

## 2016-03-30 DIAGNOSIS — C3490 Malignant neoplasm of unspecified part of unspecified bronchus or lung: Secondary | ICD-10-CM

## 2016-03-30 DIAGNOSIS — R530 Neoplastic (malignant) related fatigue: Secondary | ICD-10-CM

## 2016-03-30 DIAGNOSIS — C7971 Secondary malignant neoplasm of right adrenal gland: Secondary | ICD-10-CM

## 2016-03-30 DIAGNOSIS — C3491 Malignant neoplasm of unspecified part of right bronchus or lung: Secondary | ICD-10-CM

## 2016-03-30 DIAGNOSIS — D638 Anemia in other chronic diseases classified elsewhere: Secondary | ICD-10-CM

## 2016-03-30 DIAGNOSIS — G893 Neoplasm related pain (acute) (chronic): Secondary | ICD-10-CM

## 2016-03-30 LAB — LACTATE DEHYDROGENASE: LDH: 156 U/L (ref 125–245)

## 2016-03-30 LAB — CBC WITH DIFFERENTIAL/PLATELET
BASO%: 0.4 % (ref 0.0–2.0)
Basophils Absolute: 0 10*3/uL (ref 0.0–0.1)
EOS ABS: 0.2 10*3/uL (ref 0.0–0.5)
EOS%: 5.1 % (ref 0.0–7.0)
HEMATOCRIT: 38.4 % (ref 38.4–49.9)
HEMOGLOBIN: 13.8 g/dL (ref 13.0–17.1)
LYMPH%: 27.2 % (ref 14.0–49.0)
MCH: 32.4 pg (ref 27.2–33.4)
MCHC: 35.9 g/dL (ref 32.0–36.0)
MCV: 90.1 fL (ref 79.3–98.0)
MONO#: 0.5 10*3/uL (ref 0.1–0.9)
MONO%: 11.1 % (ref 0.0–14.0)
NEUT%: 56.2 % (ref 39.0–75.0)
NEUTROS ABS: 2.5 10*3/uL (ref 1.5–6.5)
PLATELETS: 208 10*3/uL (ref 140–400)
RBC: 4.26 10*6/uL (ref 4.20–5.82)
RDW: 14.8 % — ABNORMAL HIGH (ref 11.0–14.6)
WBC: 4.5 10*3/uL (ref 4.0–10.3)
lymph#: 1.2 10*3/uL (ref 0.9–3.3)
nRBC: 0 % (ref 0–0)

## 2016-03-30 LAB — COMPREHENSIVE METABOLIC PANEL
ALBUMIN: 3.8 g/dL (ref 3.5–5.0)
ALK PHOS: 90 U/L (ref 40–150)
ALT: 16 U/L (ref 0–55)
ANION GAP: 8 meq/L (ref 3–11)
AST: 24 U/L (ref 5–34)
BILIRUBIN TOTAL: 0.62 mg/dL (ref 0.20–1.20)
BUN: 13.6 mg/dL (ref 7.0–26.0)
CALCIUM: 9.1 mg/dL (ref 8.4–10.4)
CO2: 25 mEq/L (ref 22–29)
CREATININE: 0.9 mg/dL (ref 0.7–1.3)
Chloride: 101 mEq/L (ref 98–109)
EGFR: 79 mL/min/{1.73_m2} — ABNORMAL LOW (ref >90)
Glucose: 95 mg/dl (ref 70–140)
Potassium: 3.9 mEq/L (ref 3.5–5.1)
Sodium: 134 mEq/L — ABNORMAL LOW (ref 136–145)
TOTAL PROTEIN: 6.2 g/dL — AB (ref 6.4–8.3)

## 2016-03-30 LAB — TSH: TSH: 3.666 m[IU]/L (ref 0.320–4.118)

## 2016-03-30 MED ORDER — SODIUM CHLORIDE 0.9 % IV SOLN
Freq: Once | INTRAVENOUS | Status: AC
  Administered 2016-03-30: 16:00:00 via INTRAVENOUS

## 2016-03-30 MED ORDER — HYDROCODONE-ACETAMINOPHEN 5-325 MG PO TABS
1.0000 | ORAL_TABLET | ORAL | Status: DC | PRN
Start: 2016-03-30 — End: 2016-06-08

## 2016-03-30 MED ORDER — SODIUM CHLORIDE 0.9 % IV SOLN
240.0000 mg | Freq: Once | INTRAVENOUS | Status: AC
  Administered 2016-03-30: 240 mg via INTRAVENOUS
  Filled 2016-03-30: qty 8

## 2016-03-30 NOTE — Progress Notes (Signed)
Oncology Nurse Navigator Documentation  Oncology Nurse Navigator Flowsheets 03/30/2016  Navigator Location [facility]-Med Onc  Navigator Encounter Type Clinic/MDC  Patient Visit Type MedOnc  Treatment Phase Treatment  Barriers/Navigation Needs No barriers at this time  Interventions None required  Acuity Level 1  Time Spent with Patient 77   Spoke with patient and wife today.  He is currently on IO therapy.  No barriers identified at this time.

## 2016-03-30 NOTE — Progress Notes (Signed)
[place] [phone]   Fax:([phone]) [place], [place] [address]  DIAGNOSIS: Stage IV (T1 A., N0, M1 B.) non-small cell lung cancer of the right lower lobe, adenocarcinoma with negative EGFR mutation and negative ALK gene translocation diagnosed in October of 2014.   Molecular profile: Negative for RET, ALK, BRAF, MET, EGFR.  Positive for ERBB2 A775T, [id number], KRAS G13E, MAP2K1 D67N (see full report).   PRIOR THERAPY:  1) Systemic chemotherapy with carboplatin for AUC of 5 and Alimta 500 mg/M2 every 3 weeks, status post 6 cycles with stable disease. First dose on 10/23/2013. 2) Maintenance systemic chemotherapy with single agent Alimta 500 mg/M2 every 3 weeks. Status post 8 cycles.   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 Sondra Come completed on 12/16/2014.  CURRENT THERAPY: Immunotherapy with Nivolumab 3 MG/KG every 2 weeks, status post 25 cycles. First cycle 01/28/2015  CHEMOTHERAPY INTENT: Palliative  CURRENT # OF CHEMOTHERAPY CYCLES: 26 CURRENT ANTIEMETICS: Zofran, Compazine and dexamethasone  CURRENT SMOKING STATUS: Former smoker  ORAL CHEMOTHERAPY AND CONSENT: None  CURRENT BISPHOSPHONATES USE: None  PAIN MANAGEMENT: 0/10  NARCOTICS INDUCED CONSTIPATION: None  LIVING WILL AND CODE STATUS: ?   INTERVAL HISTORY: Anthony Curry 75 y.o. male returns to the clinic today for followup visit accompanied by his wife. The patient is feeling much better today. He enjoyed his time and vacation in [place]. He missed one cycle of his treatment because of his vacation. He has been tolerating his treatment fairly well with no significant adverse effects. He has no fever or chills. He denied having any significant chest pain, shortness of breath, cough or hemoptysis. He denied having any nausea or vomiting. No significant weight loss  or night sweats. He has no skin rash or diarrhea. He had repeat CT scan of the chest, abdomen and pelvis performed recently and he is here for evaluation and discussion of his scan results before starting cycle #26.   MEDICAL HISTORY: Past Medical History  Diagnosis Date  . Hypertension   . GERD (gastroesophageal reflux disease)   . 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 Persistent atrial fibrillation ([name])   . 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 BPH (benign prostatic hyperplasia)   . Anemia of chronic disease   . Pulmonary nodules   . Adrenal mass ([place])   . 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 Alcoholism in remission ([place])   . Radiation 12/02/14-12/16/14    Left adrenal gland 30 Gy in 10 fractions  . Complication of anesthesia 09/08/2015     JITTERY AFTER GALLBLADDER SURGERY  . Anxiety   . Cancer ([place])     small cell/lung  . Neoplastic malignant related fatigue     IV tx. every 2 weeks last9-14-16 (Dr. Earlie Server), radiation last tx. 6 months    ALLERGIES:  has No Known Allergies.  MEDICATIONS:  Current Outpatient Prescriptions  Medication Sig Dispense Refill  . ALPRAZolam (XANAX) 0.25 MG tablet Take 0.25 mg by mouth 3 (three) times daily as needed for anxiety or sleep. And also at night for sleep.    [name] apixaban (ELIQUIS) 5 MG TABS tablet Take 5 mg by mouth 2 (two) times daily.    . Ascorbic Acid (VITAMIN C PO) Take 1,000 mg by mouth daily.     . B Complex-C (SUPER B COMPLEX PO) Take 1 each by mouth  daily.     . calcium carbonate (OS-CAL) 600 MG TABS Take 600 mg by mouth daily with breakfast.    . demeclocycline (DECLOMYCIN) 150 MG tablet Take 150 mg by mouth 2 (two) times daily.    [name] diltiazem (CARDIZEM CD) 180 MG 24 hr capsule Take 1 capsule (180 mg total) by mouth daily at 12 noon. 90 capsule 3  . furosemide (LASIX) 40 MG tablet Take 0.5 tablets (20 mg total) by mouth daily. (Patient taking differently: Take 20 mg by mouth every other day. ) 90 tablet 3  .  KLOR-CON M20 20 MEQ tablet Take 20 mEq by mouth every morning.     . Lactobacillus (ACIDOPHILUS) CAPS capsule Take 1 capsule by mouth daily.    [name] MAGNESIUM PO Take 400 mg by mouth daily.     . mirtazapine (REMERON) 15 MG tablet Take 1 tablet (15 mg total) by mouth at bedtime. (Patient taking differently: Take 15 mg by mouth at bedtime as needed (sleep). ) 30 tablet 0  . Multiple Vitamin (MULTIVITAMIN WITH MINERALS) TABS Take 1 tablet by mouth daily.    . Nivolumab (OPDIVO IV) Inject as directed every 2 weeks    . Nutritional Supplements (ENSURE COMPLETE PO) Take 1 Can by mouth 2 (two) times daily.    . Omega-3 Fatty Acids (FISH OIL PO) Take 1 tablet by mouth daily.    . pantoprazole (PROTONIX) 40 MG tablet Take 40 mg by mouth every morning.  8  . tamsulosin (FLOMAX) 0.4 MG CAPS capsule Take 0.4 mg by mouth daily at 12 noon.     . thiamine (VITAMIN B-1) 100 MG tablet Take 1 tablet (100 mg total) by mouth daily. 30 tablet 6  . vitamin E 400 UNIT capsule Take 400 Units by mouth daily.    [name] HYDROcodone-acetaminophen (NORCO/VICODIN) 5-325 MG tablet Take 1-2 tablets by mouth every 4 (four) hours as needed for moderate pain. 30 tablet 0   No current facility-administered medications for this visit.    SURGICAL HISTORY:  Past Surgical History  Procedure Laterality Date  . Hemorrhoid surgery    . Ankle arthrodesis  1995    rt fx-hardware in  . Tonsillectomy    . Colonoscopy    . Excision / curettage bone cyst finger  2005    rt thumb  . Total shoulder arthroplasty  02/03/2012    Procedure: TOTAL SHOULDER ARTHROPLASTY;  Surgeon: [name], MD;  Location: [place] [facility];  Service: Orthopedics;  Laterality: Left;  . Cardioversion N/A 09/29/2014    Procedure: CARDIOVERSION;  Surgeon: Josue Hector, MD;  Location: [facility] ENDOSCOPY;  Service: Cardiovascular;  Laterality: N/A;  . Percutaneous cholecytostomy tube      IVR insertion 06-13-15(Dr. Vernard Gambles)- 08-28-15 remains in place to drainage  bag  . Ercp N/A 09/02/2015    Procedure: ENDOSCOPIC RETROGRADE CHOLANGIOPANCREATOGRAPHY (ERCP);  Surgeon: Arta Silence, MD;  Location: [facility] ENDOSCOPY;  Service: Endoscopy;  Laterality: N/A;  . Ercp N/A 09/07/2015    Procedure: ENDOSCOPIC RETROGRADE CHOLANGIOPANCREATOGRAPHY (ERCP);  Surgeon: Clarene Essex, MD;  Location: [facility] ENDOSCOPY;  Service: Endoscopy;  Laterality: N/A;  . Cholecystectomy  09/08/2015     laproscopic   . Cholecystectomy N/A 09/08/2015    Procedure: LAPAROSCOPIC CHOLECYSTECTOMY WITH ATTEMPTED INTRAOPERATIVE CHOLANGIOGRAM ;  Surgeon: Fanny Skates, MD;  Location: [place];  Service: General;  Laterality: N/A;    REVIEW OF SYSTEMS:  Constitutional: negative Eyes: negative Ears, nose, mouth, throat, and face: negative Respiratory: negative Cardiovascular: negative Gastrointestinal: negative  Genitourinary:negative Integument/breast: negative Hematologic/lymphatic: negative Musculoskeletal:negative Neurological: negative Behavioral/Psych: negative Endocrine: negative Allergic/Immunologic: negative   PHYSICAL EXAMINATION: General appearance: alert, cooperative, fatigued and no distress Head: Normocephalic, without obvious abnormality, atraumatic Neck: no adenopathy, no JVD, supple, symmetrical, trachea midline and thyroid not enlarged, symmetric, no tenderness/mass/nodules Lymph nodes: Cervical, supraclavicular, and axillary nodes normal. Resp: clear to auscultation bilaterally Back: symmetric, no curvature. ROM normal. No CVA tenderness. Cardio: regular rate and rhythm, S1, S2 normal, no murmur, click, rub or gallop GI: Right upper quadrant tenderness to palpation Extremities: extremities normal, atraumatic, no cyanosis or edema Neurologic: Alert and oriented X 3, normal strength and tone. Normal symmetric reflexes. Normal coordination and gait  ECOG PERFORMANCE STATUS: 1 - Symptomatic but completely ambulatory  Blood pressure 138/69, pulse 82, temperature 97.9 F (36.6  C), temperature source Oral, resp. rate 18, height 5' 11"  (1.803 m), weight 154 lb 8 oz (70.081 kg), SpO2 98 %.  LABORATORY DATA: Lab Results  Component Value Date   WBC 4.5 03/30/2016   HGB 13.8 03/30/2016   HCT 38.4 03/30/2016   MCV 90.1 03/30/2016   PLT 208 03/30/2016      Chemistry      Component Value Date/Time   NA 131* 03/02/2016 1143   NA 129* 01/07/2016 1844   K 3.8 03/02/2016 1143   K 4.0 01/07/2016 1844   CL 94* 01/07/2016 1844   CO2 24 03/02/2016 1143   CO2 22 01/07/2016 1844   BUN 13.8 03/02/2016 1143   BUN 16 01/07/2016 1844   CREATININE 0.8 03/02/2016 1143   CREATININE 0.85 01/07/2016 1844      Component Value Date/Time   CALCIUM 9.0 03/02/2016 1143   CALCIUM 8.7* 01/07/2016 1844   ALKPHOS 84 03/02/2016 1143   ALKPHOS 115 01/07/2016 1844   AST 29 03/02/2016 1143   AST 38 01/07/2016 1844   ALT 19 03/02/2016 1143   ALT 22 01/07/2016 1844   BILITOT 0.65 03/02/2016 1143   BILITOT 1.2 01/07/2016 1844       RADIOGRAPHIC STUDIES: Ct Chest W Contrast  03/29/2016  CLINICAL DATA:  Right lower lobe lung cancer. Diagnosed in 2014. Left adrenal metastasis diagnosed in 2015. Radiation therapy complete. Chemotherapy ongoing. EXAM: CT CHEST, ABDOMEN, AND PELVIS WITH CONTRAST TECHNIQUE: Multidetector CT imaging of the chest, abdomen and pelvis was performed following the standard protocol during bolus administration of intravenous contrast. CONTRAST:  170m ISOVUE-300 IOPAMIDOL (ISOVUE-300) INJECTION 61% COMPARISON:  01/11/2016 FINDINGS: CT CHEST FINDINGS Mediastinum/Lymph Nodes: No supraclavicular adenopathy. Right-sided gynecomastia. Aortic and branch vessel atherosclerosis. Normal heart size with small anterior pericardial fluid or thickening, similar. Multivessel coronary artery atherosclerosis. No central pulmonary embolism, on this non-dedicated study. No mediastinal or hilar adenopathy. Lungs/Pleura: No pleural fluid. Centrilobular and paraseptal emphysema. Scattered  bilateral upper lobe areas of interstitial thickening and sub solid pulmonary nodularity. Example ground-glass nodule in the right apex which measures 3.0 x 2.1 cm on image 72/series 4. Unchanged. Left apical/ upper lobe ground-glass pulmonary nodule which measures 2.6 by 2.1 cm on image 67/ series 4, similar. 3 mm anterior right lung base nodule on image 145/series 4, similar. Right base scarring. Linear left apical lesion may represent residual scarring and measures 1.6 x 0.5 cm on image 30/series 4. Similar to 1.4 x 0.9 cm on the prior. Calcified granuloma in the left lower lobe. Musculoskeletal: Remote posttraumatic or degenerate changes about the right proximal humerus. Pectus excavatum deformity. Extensive remote right rib posttraumatic or postsurgical defects. CT ABDOMEN PELVIS FINDINGS Hepatobiliary: Hyper enhancing  subcentimeter right hepatic lobe focus on image 62/series 2 is unchanged. There is also a tiny left hepatic lobe cyst, similar. Right hepatic lobe too small to characterize lesion on image 78/series 2 is not significantly changed. No suspicious liver lesion. Cholecystectomy, without biliary ductal dilatation. Pancreas: Normal, without mass or ductal dilatation. Spleen: Normal in size, without focal abnormality. Adrenals/Urinary Tract: Normal right adrenal gland. Minimal left adrenal nodularity is unchanged. Mild renal scarring, worse on the right. No hydronephrosis. Mild bladder distension with a small right-sided bladder diverticulum on image 108/series 2. Stomach/Bowel: Normal stomach, without wall thickening. Scattered colonic diverticula. Normal terminal ileum. Normal small bowel Vascular/Lymphatic: Aortic and branch vessel atherosclerosis. The No abdominopelvic adenopathy. Reproductive: Normal prostate. Other: No significant free fluid. No evidence of omental or peritoneal disease. Musculoskeletal: Mild osteopenia. Similar small sclerotic lesions throughout the pelvis, likely bone islands.  Advanced lumbar spondylosis. IMPRESSION: 1. Similar appearance of the lungs. No change in left upper lobe linear density and bilateral upper lobe predominant ground-glass opacities. 2. No specific findings to suggest recurrent or metastatic disease. 3. Small bladder diverticulum, suggesting a component of outlet obstruction. 4.  Atherosclerosis, including within the coronary arteries. 5. Right gynecomastia. Electronically Signed   By: Abigail Miyamoto M.D.   On: 03/29/2016 12:13   Ct Abdomen Pelvis W Contrast  03/29/2016  CLINICAL DATA:  Right lower lobe lung cancer. Diagnosed in 2014. Left adrenal metastasis diagnosed in 2015. Radiation therapy complete. Chemotherapy ongoing. EXAM: CT CHEST, ABDOMEN, AND PELVIS WITH CONTRAST TECHNIQUE: Multidetector CT imaging of the chest, abdomen and pelvis was performed following the standard protocol during bolus administration of intravenous contrast. CONTRAST:  113m ISOVUE-300 IOPAMIDOL (ISOVUE-300) INJECTION 61% COMPARISON:  01/11/2016 FINDINGS: CT CHEST FINDINGS Mediastinum/Lymph Nodes: No supraclavicular adenopathy. Right-sided gynecomastia. Aortic and branch vessel atherosclerosis. Normal heart size with small anterior pericardial fluid or thickening, similar. Multivessel coronary artery atherosclerosis. No central pulmonary embolism, on this non-dedicated study. No mediastinal or hilar adenopathy. Lungs/Pleura: No pleural fluid. Centrilobular and paraseptal emphysema. Scattered bilateral upper lobe areas of interstitial thickening and sub solid pulmonary nodularity. Example ground-glass nodule in the right apex which measures 3.0 x 2.1 cm on image 72/series 4. Unchanged. Left apical/ upper lobe ground-glass pulmonary nodule which measures 2.6 by 2.1 cm on image 67/ series 4, similar. 3 mm anterior right lung base nodule on image 145/series 4, similar. Right base scarring. Linear left apical lesion may represent residual scarring and measures 1.6 x 0.5 cm on image  30/series 4. Similar to 1.4 x 0.9 cm on the prior. Calcified granuloma in the left lower lobe. Musculoskeletal: Remote posttraumatic or degenerate changes about the right proximal humerus. Pectus excavatum deformity. Extensive remote right rib posttraumatic or postsurgical defects. CT ABDOMEN PELVIS FINDINGS Hepatobiliary: Hyper enhancing subcentimeter right hepatic lobe focus on image 62/series 2 is unchanged. There is also a tiny left hepatic lobe cyst, similar. Right hepatic lobe too small to characterize lesion on image 78/series 2 is not significantly changed. No suspicious liver lesion. Cholecystectomy, without biliary ductal dilatation. Pancreas: Normal, without mass or ductal dilatation. Spleen: Normal in size, without focal abnormality. Adrenals/Urinary Tract: Normal right adrenal gland. Minimal left adrenal nodularity is unchanged. Mild renal scarring, worse on the right. No hydronephrosis. Mild bladder distension with a small right-sided bladder diverticulum on image 108/series 2. Stomach/Bowel: Normal stomach, without wall thickening. Scattered colonic diverticula. Normal terminal ileum. Normal small bowel Vascular/Lymphatic: Aortic and branch vessel atherosclerosis. The No abdominopelvic adenopathy. Reproductive: Normal prostate. Other: No significant free fluid. No  evidence of omental or peritoneal disease. Musculoskeletal: Mild osteopenia. Similar small sclerotic lesions throughout the pelvis, likely bone islands. Advanced lumbar spondylosis. IMPRESSION: 1. Similar appearance of the lungs. No change in left upper lobe linear density and bilateral upper lobe predominant ground-glass opacities. 2. No specific findings to suggest recurrent or metastatic disease. 3. Small bladder diverticulum, suggesting a component of outlet obstruction. 4.  Atherosclerosis, including within the coronary arteries. 5. Right gynecomastia. Electronically Signed   By: Abigail Miyamoto M.D.   On: 03/29/2016 12:13   ASSESSMENT  AND PLAN: This is a very pleasant 75 years old white male with:  1)  Stage IV non-small cell lung cancer, adenocarcinoma status post induction chemotherapy with carboplatin and Alimta and currently on maintenance treatment with single agent Alimta status post 8 cycles. He has been observation for the last few months.  He had evidence for disease progression on the left adrenal gland and the patient underwent palliative radiotherapy to the left adrenal gland under the care of Dr. Sondra Come completed on 12/16/2014. He is currently undergoing treatment with immunotherapy with Nivolumab status post 25 cycles and tolerating his treatment fairly well. He has been off treatment the last 4 weeks for his vacation in [place]. The recent CT scan of the chest, abdomen and pelvis showed no evidence for disease progression. I discussed the scan results with the patient and his wife and recommended for him to resume his treatment with immunotherapy. He will start cycle #26 today. The patient would come back for follow-up visit in 2 weeks with the start of cycle #27.  2) Atrial fibrillation: He'll continue his current medication with Cardizem, digoxin and metoprolol in addition to Eliquis. He'll also continue his routine followup visit with his cardiologist Dr. [name].  He was advised to call immediately if he has any concerning symptoms in the interval. The patient voices understanding of current disease status and treatment options and is in agreement with the current care plan.  All questions were answered. The patient knows to call the clinic with any problems, questions or concerns. We can certainly see the patient much sooner if necessary.  Disclaimer: This note was dictated with voice recognition software. Similar sounding words can inadvertently be transcribed and may not be corrected upon review.

## 2016-03-30 NOTE — Patient Instructions (Signed)
[facility] Discharge Instructions for Patients Receiving Chemotherapy  Today you received the following chemotherapy agents Opdivo.  To help prevent nausea and vomiting after your treatment, we encourage you to take your nausea medication as directed.   If you develop nausea and vomiting that is not controlled by your nausea medication, call the clinic.   BELOW ARE SYMPTOMS THAT SHOULD BE REPORTED IMMEDIATELY:  *FEVER GREATER THAN 100.5 F  *CHILLS WITH OR WITHOUT FEVER  NAUSEA AND VOMITING THAT IS NOT CONTROLLED WITH YOUR NAUSEA MEDICATION  *UNUSUAL SHORTNESS OF BREATH  *UNUSUAL BRUISING OR BLEEDING  TENDERNESS IN MOUTH AND THROAT WITH OR WITHOUT PRESENCE OF ULCERS  *URINARY PROBLEMS  *BOWEL PROBLEMS  UNUSUAL RASH Items with * indicate a potential emergency and should be followed up as soon as possible.  Feel free to call the clinic you have any questions or concerns. The clinic phone number is [phone].  Please show the CHEMO ALERT CARD at check-in to the Emergency Department and triage nurse.    

## 2016-04-13 ENCOUNTER — Encounter: Payer: Self-pay | Admitting: Internal Medicine

## 2016-04-13 ENCOUNTER — Ambulatory Visit (HOSPITAL_BASED_OUTPATIENT_CLINIC_OR_DEPARTMENT_OTHER): Payer: Medicare Other | Admitting: Internal Medicine

## 2016-04-13 ENCOUNTER — Ambulatory Visit (HOSPITAL_BASED_OUTPATIENT_CLINIC_OR_DEPARTMENT_OTHER): Payer: Medicare Other

## 2016-04-13 ENCOUNTER — Other Ambulatory Visit (HOSPITAL_BASED_OUTPATIENT_CLINIC_OR_DEPARTMENT_OTHER): Payer: Medicare Other

## 2016-04-13 VITALS — BP 130/63 | HR 85 | Temp 98.2°F | Resp 18 | Ht 71.0 in | Wt 152.0 lb

## 2016-04-13 DIAGNOSIS — C3490 Malignant neoplasm of unspecified part of unspecified bronchus or lung: Secondary | ICD-10-CM

## 2016-04-13 DIAGNOSIS — C3491 Malignant neoplasm of unspecified part of right bronchus or lung: Secondary | ICD-10-CM

## 2016-04-13 DIAGNOSIS — Z5112 Encounter for antineoplastic immunotherapy: Secondary | ICD-10-CM

## 2016-04-13 DIAGNOSIS — C7971 Secondary malignant neoplasm of right adrenal gland: Secondary | ICD-10-CM

## 2016-04-13 DIAGNOSIS — C3431 Malignant neoplasm of lower lobe, right bronchus or lung: Secondary | ICD-10-CM

## 2016-04-13 DIAGNOSIS — I4891 Unspecified atrial fibrillation: Secondary | ICD-10-CM

## 2016-04-13 LAB — LACTATE DEHYDROGENASE: LDH: 147 U/L (ref 125–245)

## 2016-04-13 LAB — COMPREHENSIVE METABOLIC PANEL
ALBUMIN: 3.9 g/dL (ref 3.5–5.0)
ALK PHOS: 108 U/L (ref 40–150)
ALT: 17 U/L (ref 0–55)
AST: 25 U/L (ref 5–34)
Anion Gap: 7 mEq/L (ref 3–11)
BUN: 17.7 mg/dL (ref 7.0–26.0)
CALCIUM: 9.7 mg/dL (ref 8.4–10.4)
CHLORIDE: 102 meq/L (ref 98–109)
CO2: 27 mEq/L (ref 22–29)
Creatinine: 0.9 mg/dL (ref 0.7–1.3)
EGFR: 82 mL/min/{1.73_m2} — AB (ref >90)
Glucose: 99 mg/dl (ref 70–140)
POTASSIUM: 4.1 meq/L (ref 3.5–5.1)
Sodium: 136 mEq/L (ref 136–145)
Total Bilirubin: 0.56 mg/dL (ref 0.20–1.20)
Total Protein: 6.6 g/dL (ref 6.4–8.3)

## 2016-04-13 LAB — CBC WITH DIFFERENTIAL/PLATELET
BASO%: 0.5 % (ref 0.0–2.0)
BASOS ABS: 0 10*3/uL (ref 0.0–0.1)
EOS ABS: 0.2 10*3/uL (ref 0.0–0.5)
EOS%: 5 % (ref 0.0–7.0)
HEMATOCRIT: 39.7 % (ref 38.4–49.9)
HEMOGLOBIN: 14.3 g/dL (ref 13.0–17.1)
LYMPH#: 1 10*3/uL (ref 0.9–3.3)
LYMPH%: 25.7 % (ref 14.0–49.0)
MCH: 32.8 pg (ref 27.2–33.4)
MCHC: 36 g/dL (ref 32.0–36.0)
MCV: 91.1 fL (ref 79.3–98.0)
MONO#: 0.5 10*3/uL (ref 0.1–0.9)
MONO%: 12.4 % (ref 0.0–14.0)
NEUT#: 2.1 10*3/uL (ref 1.5–6.5)
NEUT%: 56.4 % (ref 39.0–75.0)
PLATELETS: 228 10*3/uL (ref 140–400)
RBC: 4.36 10*6/uL (ref 4.20–5.82)
RDW: 14.7 % — AB (ref 11.0–14.6)
WBC: 3.8 10*3/uL — ABNORMAL LOW (ref 4.0–10.3)

## 2016-04-13 MED ORDER — SODIUM CHLORIDE 0.9 % IV SOLN
240.0000 mg | Freq: Once | INTRAVENOUS | Status: AC
  Administered 2016-04-13: 240 mg via INTRAVENOUS
  Filled 2016-04-13: qty 20

## 2016-04-13 MED ORDER — SODIUM CHLORIDE 0.9 % IV SOLN
Freq: Once | INTRAVENOUS | Status: AC
  Administered 2016-04-13: 15:00:00 via INTRAVENOUS

## 2016-04-13 NOTE — Patient Instructions (Signed)
[facility] Discharge Instructions for Patients Receiving Chemotherapy  Today you received the following chemotherapy agents Nivolumab.  To help prevent nausea and vomiting after your treatment, we encourage you to take your nausea medication as prescribed.   If you develop nausea and vomiting that is not controlled by your nausea medication, call the clinic.   BELOW ARE SYMPTOMS THAT SHOULD BE REPORTED IMMEDIATELY:  *FEVER GREATER THAN 100.5 F  *CHILLS WITH OR WITHOUT FEVER  NAUSEA AND VOMITING THAT IS NOT CONTROLLED WITH YOUR NAUSEA MEDICATION  *UNUSUAL SHORTNESS OF BREATH  *UNUSUAL BRUISING OR BLEEDING  TENDERNESS IN MOUTH AND THROAT WITH OR WITHOUT PRESENCE OF ULCERS  *URINARY PROBLEMS  *BOWEL PROBLEMS  UNUSUAL RASH Items with * indicate a potential emergency and should be followed up as soon as possible.  Feel free to call the clinic you have any questions or concerns. The clinic phone number is [phone].  Please show the CHEMO ALERT CARD at check-in to the Emergency Department and triage nurse.   

## 2016-04-13 NOTE — Progress Notes (Signed)
[name] Telephone:([phone]) [phone]   Fax:([phone]) [place], [place] [address]  DIAGNOSIS: Stage IV (T1 A., N0, M1 B.) non-small cell lung cancer of the right lower lobe, adenocarcinoma with negative EGFR mutation and negative ALK gene translocation diagnosed in October of 2014.   Molecular profile: Negative for RET, ALK, BRAF, MET, EGFR.  Positive for ERBB2 A775T, [id number], KRAS G13E, MAP2K1 D67N (see full report).   PRIOR THERAPY:  1) Systemic chemotherapy with carboplatin for AUC of 5 and Alimta 500 mg/M2 every 3 weeks, status post 6 cycles with stable disease. First dose on 10/23/2013. 2) Maintenance systemic chemotherapy with single agent Alimta 500 mg/M2 every 3 weeks. Status post 8 cycles.   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 Sondra Come completed on 12/16/2014.  CURRENT THERAPY: Immunotherapy with Nivolumab 3 MG/KG every 2 weeks, status post 26 cycles. First cycle 01/28/2015  CHEMOTHERAPY INTENT: Palliative  CURRENT # OF CHEMOTHERAPY CYCLES: 27 CURRENT ANTIEMETICS: Zofran, Compazine and dexamethasone  CURRENT SMOKING STATUS: Former smoker  ORAL CHEMOTHERAPY AND CONSENT: None  CURRENT BISPHOSPHONATES USE: None  PAIN MANAGEMENT: 0/10  NARCOTICS INDUCED CONSTIPATION: None  LIVING WILL AND CODE STATUS: ?   INTERVAL HISTORY: Anthony Curry 75 y.o. male returns to the clinic today for followup visit accompanied by his wife. The patient is feeling much better today. He has been tolerating his treatment fairly well with no significant adverse effects. He has no fever or chills. He denied having any significant chest pain, shortness of breath, cough or hemoptysis. He denied having any nausea or vomiting. No significant weight loss or night sweats. He has no skin rash or diarrhea. He is here today to start cycle #27 of his  treatment.  MEDICAL HISTORY: Past Medical History  Diagnosis Date  . Hypertension   . GERD (gastroesophageal reflux disease)   . 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 Persistent atrial fibrillation ([place])   . 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 BPH (benign prostatic hyperplasia)   . Anemia of chronic disease   . Pulmonary nodules   . Adrenal mass ([place])   . 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 Alcoholism in remission ([place])   . Radiation 12/02/14-12/16/14    Left adrenal gland 30 Gy in 10 fractions  . Complication of anesthesia 09/08/2015     JITTERY AFTER GALLBLADDER SURGERY  . Anxiety   . Cancer ([place])     small cell/lung  . Neoplastic malignant related fatigue     IV tx. every 2 weeks last9-14-16 (Dr. Earlie Server), radiation last tx. 6 months    ALLERGIES:  has No Known Allergies.  MEDICATIONS:  Current Outpatient Prescriptions  Medication Sig Dispense Refill  . ALPRAZolam (XANAX) 0.25 MG tablet Take 0.25 mg by mouth 3 (three) times daily as needed for anxiety or sleep. And also at night for sleep.    [name] apixaban (ELIQUIS) 5 MG TABS tablet Take 5 mg by mouth 2 (two) times daily.    . Ascorbic Acid (VITAMIN C PO) Take 1,000 mg by mouth daily.     . B Complex-C (SUPER B COMPLEX PO) Take 1 each by mouth daily.     . calcium carbonate (OS-CAL) 600 MG TABS Take 600 mg by mouth daily with breakfast.    . demeclocycline (DECLOMYCIN) 150 MG tablet Take 150 mg by mouth 2 (two) times daily.    [name]  diltiazem (CARDIZEM CD) 180 MG 24 hr capsule Take 1 capsule (180 mg total) by mouth daily at 12 noon. 90 capsule 3  . furosemide (LASIX) 40 MG tablet Take 0.5 tablets (20 mg total) by mouth daily. (Patient taking differently: Take 20 mg by mouth every other day. ) 90 tablet 3  . HYDROcodone-acetaminophen (NORCO/VICODIN) 5-325 MG tablet Take 1-2 tablets by mouth every 4 (four) hours as needed for moderate pain. 30 tablet 0  . KLOR-CON M20 20 MEQ tablet Take 20 mEq by mouth every  morning.     . Lactobacillus (ACIDOPHILUS) CAPS capsule Take 1 capsule by mouth daily.    [name] MAGNESIUM PO Take 400 mg by mouth daily.     . mirtazapine (REMERON) 15 MG tablet Take 1 tablet (15 mg total) by mouth at bedtime. (Patient taking differently: Take 15 mg by mouth at bedtime as needed (sleep). ) 30 tablet 0  . Multiple Vitamin (MULTIVITAMIN WITH MINERALS) TABS Take 1 tablet by mouth daily.    . Nivolumab (OPDIVO IV) Inject as directed every 2 weeks    . Nutritional Supplements (ENSURE COMPLETE PO) Take 1 Can by mouth 2 (two) times daily.    . Omega-3 Fatty Acids (FISH OIL PO) Take 1 tablet by mouth daily.    . pantoprazole (PROTONIX) 40 MG tablet Take 40 mg by mouth every morning.  8  . tamsulosin (FLOMAX) 0.4 MG CAPS capsule Take 0.4 mg by mouth daily at 12 noon.     . thiamine (VITAMIN B-1) 100 MG tablet Take 1 tablet (100 mg total) by mouth daily. 30 tablet 6  . vitamin E 400 UNIT capsule Take 400 Units by mouth daily.     No current facility-administered medications for this visit.    SURGICAL HISTORY:  Past Surgical History  Procedure Laterality Date  . Hemorrhoid surgery    . Ankle arthrodesis  1995    rt fx-hardware in  . Tonsillectomy    . Colonoscopy    . Excision / curettage bone cyst finger  2005    rt thumb  . Total shoulder arthroplasty  02/03/2012    Procedure: TOTAL SHOULDER ARTHROPLASTY;  Surgeon: [name], MD;  [facility];  Service: Orthopedics;  Laterality: Left;  . Cardioversion N/A 09/29/2014    Procedure: CARDIOVERSION;  Surgeon: Josue Hector, MD;  Location: [facility] ENDOSCOPY;  Service: Cardiovascular;  Laterality: N/A;  . Percutaneous cholecytostomy tube      IVR insertion 06-13-15(Dr. Vernard Gambles)- 08-28-15 remains in place to drainage bag  . Ercp N/A 09/02/2015    Procedure: ENDOSCOPIC RETROGRADE CHOLANGIOPANCREATOGRAPHY (ERCP);  Surgeon: Arta Silence, MD;  Location: [facility];  Service: Endoscopy;  Laterality: N/A;  . Ercp N/A  09/07/2015    Procedure: ENDOSCOPIC RETROGRADE CHOLANGIOPANCREATOGRAPHY (ERCP);  Surgeon: Clarene Essex, MD;  Location: [facility];  Service: Endoscopy;  Laterality: N/A;  . Cholecystectomy  09/08/2015     laproscopic   . Cholecystectomy N/A 09/08/2015    Procedure: LAPAROSCOPIC CHOLECYSTECTOMY WITH ATTEMPTED INTRAOPERATIVE CHOLANGIOGRAM ;  Surgeon: Fanny Skates, MD;  Location: [facility];  Service: General;  Laterality: N/A;    REVIEW OF SYSTEMS:  A comprehensive review of systems was negative.   PHYSICAL EXAMINATION: General appearance: alert, cooperative, fatigued and no distress Head: Normocephalic, without obvious abnormality, atraumatic Neck: no adenopathy, no JVD, supple, symmetrical, trachea midline and thyroid not enlarged, symmetric, no tenderness/mass/nodules Lymph nodes: Cervical, supraclavicular, and axillary nodes normal. Resp: clear to auscultation bilaterally Back: symmetric, no curvature. ROM  normal. No CVA tenderness. Cardio: regular rate and rhythm, S1, S2 normal, no murmur, click, rub or gallop GI: Right upper quadrant tenderness to palpation Extremities: extremities normal, atraumatic, no cyanosis or edema Neurologic: Alert and oriented X 3, normal strength and tone. Normal symmetric reflexes. Normal coordination and gait  ECOG PERFORMANCE STATUS: 1 - Symptomatic but completely ambulatory  Blood pressure 130/63, pulse 85, temperature 98.2 F (36.8 C), temperature source Oral, resp. rate 18, height 5' 11"  (1.803 m), weight 152 lb (68.947 kg), SpO2 97 %.  LABORATORY DATA: Lab Results  Component Value Date   WBC 3.8* 04/13/2016   HGB 14.3 04/13/2016   HCT 39.7 04/13/2016   MCV 91.1 04/13/2016   PLT 228 04/13/2016      Chemistry      Component Value Date/Time   NA 136 04/13/2016 1255   NA 129* 01/07/2016 1844   K 4.1 04/13/2016 1255   K 4.0 01/07/2016 1844   CL 94* 01/07/2016 1844   CO2 27 04/13/2016 1255   CO2 22 01/07/2016 1844   BUN 17.7 04/13/2016 1255    BUN 16 01/07/2016 1844   CREATININE 0.9 04/13/2016 1255   CREATININE 0.85 01/07/2016 1844      Component Value Date/Time   CALCIUM 9.7 04/13/2016 1255   CALCIUM 8.7* 01/07/2016 1844   ALKPHOS 108 04/13/2016 1255   ALKPHOS 115 01/07/2016 1844   AST 25 04/13/2016 1255   AST 38 01/07/2016 1844   ALT 17 04/13/2016 1255   ALT 22 01/07/2016 1844   BILITOT 0.56 04/13/2016 1255   BILITOT 1.2 01/07/2016 1844       RADIOGRAPHIC STUDIES: Ct Chest W Contrast  03/29/2016  CLINICAL DATA:  Right lower lobe lung cancer. Diagnosed in 2014. Left adrenal metastasis diagnosed in 2015. Radiation therapy complete. Chemotherapy ongoing. EXAM: CT CHEST, ABDOMEN, AND PELVIS WITH CONTRAST TECHNIQUE: Multidetector CT imaging of the chest, abdomen and pelvis was performed following the standard protocol during bolus administration of intravenous contrast. CONTRAST:  136m ISOVUE-300 IOPAMIDOL (ISOVUE-300) INJECTION 61% COMPARISON:  01/11/2016 FINDINGS: CT CHEST FINDINGS Mediastinum/Lymph Nodes: No supraclavicular adenopathy. Right-sided gynecomastia. Aortic and branch vessel atherosclerosis. Normal heart size with small anterior pericardial fluid or thickening, similar. Multivessel coronary artery atherosclerosis. No central pulmonary embolism, on this non-dedicated study. No mediastinal or hilar adenopathy. Lungs/Pleura: No pleural fluid. Centrilobular and paraseptal emphysema. Scattered bilateral upper lobe areas of interstitial thickening and sub solid pulmonary nodularity. Example ground-glass nodule in the right apex which measures 3.0 x 2.1 cm on image 72/series 4. Unchanged. Left apical/ upper lobe ground-glass pulmonary nodule which measures 2.6 by 2.1 cm on image 67/ series 4, similar. 3 mm anterior right lung base nodule on image 145/series 4, similar. Right base scarring. Linear left apical lesion may represent residual scarring and measures 1.6 x 0.5 cm on image 30/series 4. Similar to 1.4 x 0.9 cm on the  prior. Calcified granuloma in the left lower lobe. Musculoskeletal: Remote posttraumatic or degenerate changes about the right proximal humerus. Pectus excavatum deformity. Extensive remote right rib posttraumatic or postsurgical defects. CT ABDOMEN PELVIS FINDINGS Hepatobiliary: Hyper enhancing subcentimeter right hepatic lobe focus on image 62/series 2 is unchanged. There is also a tiny left hepatic lobe cyst, similar. Right hepatic lobe too small to characterize lesion on image 78/series 2 is not significantly changed. No suspicious liver lesion. Cholecystectomy, without biliary ductal dilatation. Pancreas: Normal, without mass or ductal dilatation. Spleen: Normal in size, without focal abnormality. Adrenals/Urinary Tract: Normal right adrenal gland. Minimal left  adrenal nodularity is unchanged. Mild renal scarring, worse on the right. No hydronephrosis. Mild bladder distension with a small right-sided bladder diverticulum on image 108/series 2. Stomach/Bowel: Normal stomach, without wall thickening. Scattered colonic diverticula. Normal terminal ileum. Normal small bowel Vascular/Lymphatic: Aortic and branch vessel atherosclerosis. The No abdominopelvic adenopathy. Reproductive: Normal prostate. Other: No significant free fluid. No evidence of omental or peritoneal disease. Musculoskeletal: Mild osteopenia. Similar small sclerotic lesions throughout the pelvis, likely bone islands. Advanced lumbar spondylosis. IMPRESSION: 1. Similar appearance of the lungs. No change in left upper lobe linear density and bilateral upper lobe predominant ground-glass opacities. 2. No specific findings to suggest recurrent or metastatic disease. 3. Small bladder diverticulum, suggesting a component of outlet obstruction. 4.  Atherosclerosis, including within the coronary arteries. 5. Right gynecomastia. Electronically Signed   By: Abigail Miyamoto M.D.   On: 03/29/2016 12:13   Ct Abdomen Pelvis W Contrast  03/29/2016  CLINICAL  DATA:  Right lower lobe lung cancer. Diagnosed in 2014. Left adrenal metastasis diagnosed in 2015. Radiation therapy complete. Chemotherapy ongoing. EXAM: CT CHEST, ABDOMEN, AND PELVIS WITH CONTRAST TECHNIQUE: Multidetector CT imaging of the chest, abdomen and pelvis was performed following the standard protocol during bolus administration of intravenous contrast. CONTRAST:  176m ISOVUE-300 IOPAMIDOL (ISOVUE-300) INJECTION 61% COMPARISON:  01/11/2016 FINDINGS: CT CHEST FINDINGS Mediastinum/Lymph Nodes: No supraclavicular adenopathy. Right-sided gynecomastia. Aortic and branch vessel atherosclerosis. Normal heart size with small anterior pericardial fluid or thickening, similar. Multivessel coronary artery atherosclerosis. No central pulmonary embolism, on this non-dedicated study. No mediastinal or hilar adenopathy. Lungs/Pleura: No pleural fluid. Centrilobular and paraseptal emphysema. Scattered bilateral upper lobe areas of interstitial thickening and sub solid pulmonary nodularity. Example ground-glass nodule in the right apex which measures 3.0 x 2.1 cm on image 72/series 4. Unchanged. Left apical/ upper lobe ground-glass pulmonary nodule which measures 2.6 by 2.1 cm on image 67/ series 4, similar. 3 mm anterior right lung base nodule on image 145/series 4, similar. Right base scarring. Linear left apical lesion may represent residual scarring and measures 1.6 x 0.5 cm on image 30/series 4. Similar to 1.4 x 0.9 cm on the prior. Calcified granuloma in the left lower lobe. Musculoskeletal: Remote posttraumatic or degenerate changes about the right proximal humerus. Pectus excavatum deformity. Extensive remote right rib posttraumatic or postsurgical defects. CT ABDOMEN PELVIS FINDINGS Hepatobiliary: Hyper enhancing subcentimeter right hepatic lobe focus on image 62/series 2 is unchanged. There is also a tiny left hepatic lobe cyst, similar. Right hepatic lobe too small to characterize lesion on image 78/series 2  is not significantly changed. No suspicious liver lesion. Cholecystectomy, without biliary ductal dilatation. Pancreas: Normal, without mass or ductal dilatation. Spleen: Normal in size, without focal abnormality. Adrenals/Urinary Tract: Normal right adrenal gland. Minimal left adrenal nodularity is unchanged. Mild renal scarring, worse on the right. No hydronephrosis. Mild bladder distension with a small right-sided bladder diverticulum on image 108/series 2. Stomach/Bowel: Normal stomach, without wall thickening. Scattered colonic diverticula. Normal terminal ileum. Normal small bowel Vascular/Lymphatic: Aortic and branch vessel atherosclerosis. The No abdominopelvic adenopathy. Reproductive: Normal prostate. Other: No significant free fluid. No evidence of omental or peritoneal disease. Musculoskeletal: Mild osteopenia. Similar small sclerotic lesions throughout the pelvis, likely bone islands. Advanced lumbar spondylosis. IMPRESSION: 1. Similar appearance of the lungs. No change in left upper lobe linear density and bilateral upper lobe predominant ground-glass opacities. 2. No specific findings to suggest recurrent or metastatic disease. 3. Small bladder diverticulum, suggesting a component of outlet obstruction. 4.  Atherosclerosis, including  within the coronary arteries. 5. Right gynecomastia. Electronically Signed   By: Abigail Miyamoto M.D.   On: 03/29/2016 12:13   ASSESSMENT AND PLAN: This is a very pleasant 75 years old white male with:  1)  Stage IV non-small cell lung cancer, adenocarcinoma status post induction chemotherapy with carboplatin and Alimta and currently on maintenance treatment with single agent Alimta status post 8 cycles. He has been observation for the last few months.  He had evidence for disease progression on the left adrenal gland and the patient underwent palliative radiotherapy to the left adrenal gland under the care of Dr. Sondra Come completed on 12/16/2014. He is currently  undergoing treatment with immunotherapy with Nivolumab status post 26 cycles and tolerating his treatment fairly well. The patient is doing fine today. I recommended for him to proceed with cycle #27 today as scheduled. The patient would come back for follow-up visit in 2 weeks with the start of cycle #28.  2) Atrial fibrillation: He'll continue his current medication with Cardizem, digoxin and metoprolol in addition to Eliquis. He'll also continue his routine followup visit with his cardiologist Dr. [name].  He was advised to call immediately if he has any concerning symptoms in the interval. The patient voices understanding of current disease status and treatment options and is in agreement with the current care plan.  All questions were answered. The patient knows to call the clinic with any problems, questions or concerns. We can certainly see the patient much sooner if necessary.  Disclaimer: This note was dictated with voice recognition software. Similar sounding words can inadvertently be transcribed and may not be corrected upon review.

## 2016-04-27 ENCOUNTER — Encounter: Payer: Self-pay | Admitting: Internal Medicine

## 2016-04-27 ENCOUNTER — Other Ambulatory Visit (HOSPITAL_BASED_OUTPATIENT_CLINIC_OR_DEPARTMENT_OTHER): Payer: Medicare Other

## 2016-04-27 ENCOUNTER — Ambulatory Visit (HOSPITAL_BASED_OUTPATIENT_CLINIC_OR_DEPARTMENT_OTHER): Payer: Medicare Other

## 2016-04-27 ENCOUNTER — Ambulatory Visit (HOSPITAL_BASED_OUTPATIENT_CLINIC_OR_DEPARTMENT_OTHER): Payer: Medicare Other | Admitting: Internal Medicine

## 2016-04-27 VITALS — BP 132/65 | HR 94 | Temp 97.9°F | Resp 18 | Ht 71.0 in | Wt 154.1 lb

## 2016-04-27 DIAGNOSIS — I4891 Unspecified atrial fibrillation: Secondary | ICD-10-CM

## 2016-04-27 DIAGNOSIS — C7971 Secondary malignant neoplasm of right adrenal gland: Secondary | ICD-10-CM

## 2016-04-27 DIAGNOSIS — Z5112 Encounter for antineoplastic immunotherapy: Secondary | ICD-10-CM | POA: Diagnosis present

## 2016-04-27 DIAGNOSIS — C3431 Malignant neoplasm of lower lobe, right bronchus or lung: Secondary | ICD-10-CM

## 2016-04-27 DIAGNOSIS — C3491 Malignant neoplasm of unspecified part of right bronchus or lung: Secondary | ICD-10-CM

## 2016-04-27 LAB — CBC WITH DIFFERENTIAL/PLATELET
BASO%: 0.6 % (ref 0.0–2.0)
BASOS ABS: 0 10*3/uL (ref 0.0–0.1)
EOS%: 4.6 % (ref 0.0–7.0)
Eosinophils Absolute: 0.2 10*3/uL (ref 0.0–0.5)
HCT: 44.5 % (ref 38.4–49.9)
HEMOGLOBIN: 15.1 g/dL (ref 13.0–17.1)
LYMPH#: 1.2 10*3/uL (ref 0.9–3.3)
LYMPH%: 23.4 % (ref 14.0–49.0)
MCH: 32 pg (ref 27.2–33.4)
MCHC: 33.9 g/dL (ref 32.0–36.0)
MCV: 94.4 fL (ref 79.3–98.0)
MONO#: 0.6 10*3/uL (ref 0.1–0.9)
MONO%: 12 % (ref 0.0–14.0)
NEUT#: 3 10*3/uL (ref 1.5–6.5)
NEUT%: 59.4 % (ref 39.0–75.0)
Platelets: 224 10*3/uL (ref 140–400)
RBC: 4.71 10*6/uL (ref 4.20–5.82)
RDW: 14.6 % (ref 11.0–14.6)
WBC: 5 10*3/uL (ref 4.0–10.3)

## 2016-04-27 LAB — COMPREHENSIVE METABOLIC PANEL
ALBUMIN: 3.8 g/dL (ref 3.5–5.0)
ALK PHOS: 98 U/L (ref 40–150)
ALT: 15 U/L (ref 0–55)
AST: 21 U/L (ref 5–34)
Anion Gap: 9 mEq/L (ref 3–11)
BILIRUBIN TOTAL: 0.7 mg/dL (ref 0.20–1.20)
BUN: 16.9 mg/dL (ref 7.0–26.0)
CO2: 23 meq/L (ref 22–29)
Calcium: 9.5 mg/dL (ref 8.4–10.4)
Chloride: 102 mEq/L (ref 98–109)
Creatinine: 0.8 mg/dL (ref 0.7–1.3)
EGFR: 85 mL/min/{1.73_m2} — AB (ref >90)
GLUCOSE: 109 mg/dL (ref 70–140)
Potassium: 3.8 mEq/L (ref 3.5–5.1)
SODIUM: 134 meq/L — AB (ref 136–145)
TOTAL PROTEIN: 6.4 g/dL (ref 6.4–8.3)

## 2016-04-27 LAB — LACTATE DEHYDROGENASE: LDH: 136 U/L (ref 125–245)

## 2016-04-27 MED ORDER — SODIUM CHLORIDE 0.9 % IV SOLN
Freq: Once | INTRAVENOUS | Status: AC
  Administered 2016-04-27: 12:00:00 via INTRAVENOUS

## 2016-04-27 MED ORDER — SODIUM CHLORIDE 0.9 % IV SOLN
240.0000 mg | Freq: Once | INTRAVENOUS | Status: AC
  Administered 2016-04-27: 240 mg via INTRAVENOUS
  Filled 2016-04-27: qty 20

## 2016-04-27 NOTE — Progress Notes (Signed)
[place] Telephone:([phone]) [phone]   Fax:([phone]) [place], [place] [address]  DIAGNOSIS: Stage IV (T1 A., N0, M1 B.) non-small cell lung cancer of the right lower lobe, adenocarcinoma with negative EGFR mutation and negative ALK gene translocation diagnosed in October of 2014.   Molecular profile: Negative for RET, ALK, BRAF, MET, EGFR.  Positive for ERBB2 A775T, [id number], KRAS G13E, MAP2K1 D67N (see full report).   PRIOR THERAPY:  1) Systemic chemotherapy with carboplatin for AUC of 5 and Alimta 500 mg/M2 every 3 weeks, status post 6 cycles with stable disease. First dose on 10/23/2013. 2) Maintenance systemic chemotherapy with single agent Alimta 500 mg/M2 every 3 weeks. Status post 8 cycles.   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 Sondra Come completed on 12/16/2014.  CURRENT THERAPY: Immunotherapy with Nivolumab 3 MG/KG every 2 weeks, status post 27 cycles. First cycle 01/28/2015  CHEMOTHERAPY INTENT: Palliative  CURRENT # OF CHEMOTHERAPY CYCLES: 28 CURRENT ANTIEMETICS: Zofran, Compazine and dexamethasone  CURRENT SMOKING STATUS: Former smoker  ORAL CHEMOTHERAPY AND CONSENT: None  CURRENT BISPHOSPHONATES USE: None  PAIN MANAGEMENT: 0/10  NARCOTICS INDUCED CONSTIPATION: None  LIVING WILL AND CODE STATUS: ?   INTERVAL HISTORY: Anthony Curry 75 y.o. male returns to the clinic today for followup visit accompanied by his wife. The patient is feeling much better today. He has been tolerating his treatment fairly well with no significant adverse effects except for mild fatigue. He has no fever or chills. He denied having any significant chest pain, shortness of breath, cough or hemoptysis. He denied having any nausea or vomiting. No significant weight loss or night sweats. He has no skin rash or diarrhea. He is here today to start cycle #28 of  his treatment.  MEDICAL HISTORY: Past Medical History  Diagnosis Date  . Hypertension   . GERD (gastroesophageal reflux disease)   . 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 Persistent atrial fibrillation ([place])   . 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 BPH (benign prostatic hyperplasia)   . Anemia of chronic disease   . Pulmonary nodules   . Adrenal mass ([place])   . 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 Alcoholism in remission ([place])   . Radiation 12/02/14-12/16/14    Left adrenal gland 30 Gy in 10 fractions  . Complication of anesthesia 09/08/2015     JITTERY AFTER GALLBLADDER SURGERY  . Anxiety   . Cancer ([place])     small cell/lung  . Neoplastic malignant related fatigue     IV tx. every 2 weeks last9-14-16 (Dr. Earlie Server), radiation last tx. 6 months    ALLERGIES:  has No Known Allergies.  MEDICATIONS:  Current Outpatient Prescriptions  Medication Sig Dispense Refill  . ALPRAZolam (XANAX) 0.25 MG tablet Take 0.25 mg by mouth 3 (three) times daily as needed for anxiety or sleep. And also at night for sleep.    [name] apixaban (ELIQUIS) 5 MG TABS tablet Take 5 mg by mouth 2 (two) times daily.    . Ascorbic Acid (VITAMIN C PO) Take 1,000 mg by mouth daily.     . B Complex-C (SUPER B COMPLEX PO) Take 1 each by mouth daily.     . calcium carbonate (OS-CAL) 600 MG TABS Take 600 mg by mouth daily with breakfast.    . demeclocycline (DECLOMYCIN) 150 MG tablet Take 150 mg by mouth 2 (  two) times daily.    [name] diltiazem (CARDIZEM CD) 180 MG 24 hr capsule Take 1 capsule (180 mg total) by mouth daily at 12 noon. 90 capsule 3  . furosemide (LASIX) 40 MG tablet Take 0.5 tablets (20 mg total) by mouth daily. (Patient taking differently: Take 20 mg by mouth every other day. ) 90 tablet 3  . HYDROcodone-acetaminophen (NORCO/VICODIN) 5-325 MG tablet Take 1-2 tablets by mouth every 4 (four) hours as needed for moderate pain. 30 tablet 0  . KLOR-CON M20 20 MEQ tablet Take 20 mEq by mouth every  morning.     . Lactobacillus (ACIDOPHILUS) CAPS capsule Take 1 capsule by mouth daily.    [name] MAGNESIUM PO Take 400 mg by mouth daily.     . mirtazapine (REMERON) 15 MG tablet Take 1 tablet (15 mg total) by mouth at bedtime. (Patient taking differently: Take 15 mg by mouth at bedtime as needed (sleep). ) 30 tablet 0  . Multiple Vitamin (MULTIVITAMIN WITH MINERALS) TABS Take 1 tablet by mouth daily.    . Nivolumab (OPDIVO IV) Inject as directed every 2 weeks    . Nutritional Supplements (ENSURE COMPLETE PO) Take 1 Can by mouth 2 (two) times daily.    . Omega-3 Fatty Acids (FISH OIL PO) Take 1 tablet by mouth daily.    . pantoprazole (PROTONIX) 40 MG tablet Take 40 mg by mouth every morning.  8  . tamsulosin (FLOMAX) 0.4 MG CAPS capsule Take 0.4 mg by mouth daily at 12 noon.     . thiamine (VITAMIN B-1) 100 MG tablet Take 1 tablet (100 mg total) by mouth daily. 30 tablet 6  . vitamin E 400 UNIT capsule Take 400 Units by mouth daily.     No current facility-administered medications for this visit.    SURGICAL HISTORY:  Past Surgical History  Procedure Laterality Date  . Hemorrhoid surgery    . Ankle arthrodesis  1995    rt fx-hardware in  . Tonsillectomy    . Colonoscopy    . Excision / curettage bone cyst finger  2005    rt thumb  . Total shoulder arthroplasty  02/03/2012    Procedure: TOTAL SHOULDER ARTHROPLASTY;  Surgeon: [name], MD;  Location: [facility];  Service: Orthopedics;  Laterality: Left;  . Cardioversion N/A 09/29/2014    Procedure: CARDIOVERSION;  Surgeon: Josue Hector, MD;  Location: [facility] ENDOSCOPY;  Service: Cardiovascular;  Laterality: N/A;  . Percutaneous cholecytostomy tube      IVR insertion 06-13-15(Dr. Vernard Gambles)- 08-28-15 remains in place to drainage bag  . Ercp N/A 09/02/2015    Procedure: ENDOSCOPIC RETROGRADE CHOLANGIOPANCREATOGRAPHY (ERCP);  Surgeon: Arta Silence, MD;  Location: [facility] ENDOSCOPY;  Service: Endoscopy;  Laterality: N/A;  . Ercp N/A  09/07/2015    Procedure: ENDOSCOPIC RETROGRADE CHOLANGIOPANCREATOGRAPHY (ERCP);  Surgeon: Clarene Essex, MD;  Location: [facility] ENDOSCOPY;  Service: Endoscopy;  Laterality: N/A;  . Cholecystectomy  09/08/2015     laproscopic   . Cholecystectomy N/A 09/08/2015    Procedure: LAPAROSCOPIC CHOLECYSTECTOMY WITH ATTEMPTED INTRAOPERATIVE CHOLANGIOGRAM ;  Surgeon: Fanny Skates, MD;  Location: [facility];  Service: General;  Laterality: N/A;    REVIEW OF SYSTEMS:  A comprehensive review of systems was negative except for: Constitutional: positive for fatigue   PHYSICAL EXAMINATION: General appearance: alert, cooperative, fatigued and no distress Head: Normocephalic, without obvious abnormality, atraumatic Neck: no adenopathy, no JVD, supple, symmetrical, trachea midline and thyroid not enlarged, symmetric, no tenderness/mass/nodules Lymph nodes: Cervical, supraclavicular, and  axillary nodes normal. Resp: clear to auscultation bilaterally Back: symmetric, no curvature. ROM normal. No CVA tenderness. Cardio: regular rate and rhythm, S1, S2 normal, no murmur, click, rub or gallop GI: Right upper quadrant tenderness to palpation Extremities: extremities normal, atraumatic, no cyanosis or edema Neurologic: Alert and oriented X 3, normal strength and tone. Normal symmetric reflexes. Normal coordination and gait  ECOG PERFORMANCE STATUS: 1 - Symptomatic but completely ambulatory  Blood pressure 132/65, pulse 94, temperature 97.9 F (36.6 C), temperature source Oral, resp. rate 18, height 5' 11"  (1.803 m), weight 154 lb 1.6 oz (69.899 kg), SpO2 100 %.  LABORATORY DATA: Lab Results  Component Value Date   WBC 5.0 04/27/2016   HGB 15.1 04/27/2016   HCT 44.5 04/27/2016   MCV 94.4 04/27/2016   PLT 224 04/27/2016      Chemistry      Component Value Date/Time   NA 136 04/13/2016 1255   NA 129* 01/07/2016 1844   K 4.1 04/13/2016 1255   K 4.0 01/07/2016 1844   CL 94* 01/07/2016 1844   CO2 27 04/13/2016 1255    CO2 22 01/07/2016 1844   BUN 17.7 04/13/2016 1255   BUN 16 01/07/2016 1844   CREATININE 0.9 04/13/2016 1255   CREATININE 0.85 01/07/2016 1844      Component Value Date/Time   CALCIUM 9.7 04/13/2016 1255   CALCIUM 8.7* 01/07/2016 1844   ALKPHOS 108 04/13/2016 1255   ALKPHOS 115 01/07/2016 1844   AST 25 04/13/2016 1255   AST 38 01/07/2016 1844   ALT 17 04/13/2016 1255   ALT 22 01/07/2016 1844   BILITOT 0.56 04/13/2016 1255   BILITOT 1.2 01/07/2016 1844       RADIOGRAPHIC STUDIES: Ct Chest W Contrast  03/29/2016  CLINICAL DATA:  Right lower lobe lung cancer. Diagnosed in 2014. Left adrenal metastasis diagnosed in 2015. Radiation therapy complete. Chemotherapy ongoing. EXAM: CT CHEST, ABDOMEN, AND PELVIS WITH CONTRAST TECHNIQUE: Multidetector CT imaging of the chest, abdomen and pelvis was performed following the standard protocol during bolus administration of intravenous contrast. CONTRAST:  167m ISOVUE-300 IOPAMIDOL (ISOVUE-300) INJECTION 61% COMPARISON:  01/11/2016 FINDINGS: CT CHEST FINDINGS Mediastinum/Lymph Nodes: No supraclavicular adenopathy. Right-sided gynecomastia. Aortic and branch vessel atherosclerosis. Normal heart size with small anterior pericardial fluid or thickening, similar. Multivessel coronary artery atherosclerosis. No central pulmonary embolism, on this non-dedicated study. No mediastinal or hilar adenopathy. Lungs/Pleura: No pleural fluid. Centrilobular and paraseptal emphysema. Scattered bilateral upper lobe areas of interstitial thickening and sub solid pulmonary nodularity. Example ground-glass nodule in the right apex which measures 3.0 x 2.1 cm on image 72/series 4. Unchanged. Left apical/ upper lobe ground-glass pulmonary nodule which measures 2.6 by 2.1 cm on image 67/ series 4, similar. 3 mm anterior right lung base nodule on image 145/series 4, similar. Right base scarring. Linear left apical lesion may represent residual scarring and measures 1.6 x 0.5 cm on  image 30/series 4. Similar to 1.4 x 0.9 cm on the prior. Calcified granuloma in the left lower lobe. Musculoskeletal: Remote posttraumatic or degenerate changes about the right proximal humerus. Pectus excavatum deformity. Extensive remote right rib posttraumatic or postsurgical defects. CT ABDOMEN PELVIS FINDINGS Hepatobiliary: Hyper enhancing subcentimeter right hepatic lobe focus on image 62/series 2 is unchanged. There is also a tiny left hepatic lobe cyst, similar. Right hepatic lobe too small to characterize lesion on image 78/series 2 is not significantly changed. No suspicious liver lesion. Cholecystectomy, without biliary ductal dilatation. Pancreas: Normal, without mass or ductal dilatation.  Spleen: Normal in size, without focal abnormality. Adrenals/Urinary Tract: Normal right adrenal gland. Minimal left adrenal nodularity is unchanged. Mild renal scarring, worse on the right. No hydronephrosis. Mild bladder distension with a small right-sided bladder diverticulum on image 108/series 2. Stomach/Bowel: Normal stomach, without wall thickening. Scattered colonic diverticula. Normal terminal ileum. Normal small bowel Vascular/Lymphatic: Aortic and branch vessel atherosclerosis. The No abdominopelvic adenopathy. Reproductive: Normal prostate. Other: No significant free fluid. No evidence of omental or peritoneal disease. Musculoskeletal: Mild osteopenia. Similar small sclerotic lesions throughout the pelvis, likely bone islands. Advanced lumbar spondylosis. IMPRESSION: 1. Similar appearance of the lungs. No change in left upper lobe linear density and bilateral upper lobe predominant ground-glass opacities. 2. No specific findings to suggest recurrent or metastatic disease. 3. Small bladder diverticulum, suggesting a component of outlet obstruction. 4.  Atherosclerosis, including within the coronary arteries. 5. Right gynecomastia. Electronically Signed   By: Abigail Miyamoto M.D.   On: 03/29/2016 12:13   Ct  Abdomen Pelvis W Contrast  03/29/2016  CLINICAL DATA:  Right lower lobe lung cancer. Diagnosed in 2014. Left adrenal metastasis diagnosed in 2015. Radiation therapy complete. Chemotherapy ongoing. EXAM: CT CHEST, ABDOMEN, AND PELVIS WITH CONTRAST TECHNIQUE: Multidetector CT imaging of the chest, abdomen and pelvis was performed following the standard protocol during bolus administration of intravenous contrast. CONTRAST:  133m ISOVUE-300 IOPAMIDOL (ISOVUE-300) INJECTION 61% COMPARISON:  01/11/2016 FINDINGS: CT CHEST FINDINGS Mediastinum/Lymph Nodes: No supraclavicular adenopathy. Right-sided gynecomastia. Aortic and branch vessel atherosclerosis. Normal heart size with small anterior pericardial fluid or thickening, similar. Multivessel coronary artery atherosclerosis. No central pulmonary embolism, on this non-dedicated study. No mediastinal or hilar adenopathy. Lungs/Pleura: No pleural fluid. Centrilobular and paraseptal emphysema. Scattered bilateral upper lobe areas of interstitial thickening and sub solid pulmonary nodularity. Example ground-glass nodule in the right apex which measures 3.0 x 2.1 cm on image 72/series 4. Unchanged. Left apical/ upper lobe ground-glass pulmonary nodule which measures 2.6 by 2.1 cm on image 67/ series 4, similar. 3 mm anterior right lung base nodule on image 145/series 4, similar. Right base scarring. Linear left apical lesion may represent residual scarring and measures 1.6 x 0.5 cm on image 30/series 4. Similar to 1.4 x 0.9 cm on the prior. Calcified granuloma in the left lower lobe. Musculoskeletal: Remote posttraumatic or degenerate changes about the right proximal humerus. Pectus excavatum deformity. Extensive remote right rib posttraumatic or postsurgical defects. CT ABDOMEN PELVIS FINDINGS Hepatobiliary: Hyper enhancing subcentimeter right hepatic lobe focus on image 62/series 2 is unchanged. There is also a tiny left hepatic lobe cyst, similar. Right hepatic lobe too  small to characterize lesion on image 78/series 2 is not significantly changed. No suspicious liver lesion. Cholecystectomy, without biliary ductal dilatation. Pancreas: Normal, without mass or ductal dilatation. Spleen: Normal in size, without focal abnormality. Adrenals/Urinary Tract: Normal right adrenal gland. Minimal left adrenal nodularity is unchanged. Mild renal scarring, worse on the right. No hydronephrosis. Mild bladder distension with a small right-sided bladder diverticulum on image 108/series 2. Stomach/Bowel: Normal stomach, without wall thickening. Scattered colonic diverticula. Normal terminal ileum. Normal small bowel Vascular/Lymphatic: Aortic and branch vessel atherosclerosis. The No abdominopelvic adenopathy. Reproductive: Normal prostate. Other: No significant free fluid. No evidence of omental or peritoneal disease. Musculoskeletal: Mild osteopenia. Similar small sclerotic lesions throughout the pelvis, likely bone islands. Advanced lumbar spondylosis. IMPRESSION: 1. Similar appearance of the lungs. No change in left upper lobe linear density and bilateral upper lobe predominant ground-glass opacities. 2. No specific findings to suggest recurrent or metastatic  disease. 3. Small bladder diverticulum, suggesting a component of outlet obstruction. 4.  Atherosclerosis, including within the coronary arteries. 5. Right gynecomastia. Electronically Signed   By: Abigail Miyamoto M.D.   On: 03/29/2016 12:13   ASSESSMENT AND PLAN: This is a very pleasant 75 years old white male with:  1)  Stage IV non-small cell lung cancer, adenocarcinoma status post induction chemotherapy with carboplatin and Alimta and currently on maintenance treatment with single agent Alimta status post 8 cycles. He has been observation for the last few months.  He had evidence for disease progression on the left adrenal gland and the patient underwent palliative radiotherapy to the left adrenal gland under the care of Dr.  Sondra Come completed on 12/16/2014. He is currently undergoing treatment with immunotherapy with Nivolumab status post 27 cycles and tolerating his treatment fairly well. He has mild fatigue and 2-3 episodes of diarrhea over the last 2 weeks. The patient is doing fine today. I recommended for him to proceed with cycle #28 today as scheduled. The patient would come back for follow-up visit in 2 weeks with the start of cycle #29.  2) Atrial fibrillation: He'll continue his current medication with Cardizem, digoxin and metoprolol in addition to Eliquis. He'll also continue his routine followup visit with his cardiologist Dr. [name].  He was advised to call immediately if he has any concerning symptoms in the interval. The patient voices understanding of current disease status and treatment options and is in agreement with the current care plan.  All questions were answered. The patient knows to call the clinic with any problems, questions or concerns. We can certainly see the patient much sooner if necessary.  Disclaimer: This note was dictated with voice recognition software. Similar sounding words can inadvertently be transcribed and may not be corrected upon review.

## 2016-04-27 NOTE — Patient Instructions (Signed)
[facility] Discharge Instructions for Patients Receiving Chemotherapy  Today you received the following chemotherapy agents:  Nivolumab.  To help prevent nausea and vomiting after your treatment, we encourage you to take your nausea medication as directed.   If you develop nausea and vomiting that is not controlled by your nausea medication, call the clinic.   BELOW ARE SYMPTOMS THAT SHOULD BE REPORTED IMMEDIATELY:  *FEVER GREATER THAN 100.5 F  *CHILLS WITH OR WITHOUT FEVER  NAUSEA AND VOMITING THAT IS NOT CONTROLLED WITH YOUR NAUSEA MEDICATION  *UNUSUAL SHORTNESS OF BREATH  *UNUSUAL BRUISING OR BLEEDING  TENDERNESS IN MOUTH AND THROAT WITH OR WITHOUT PRESENCE OF ULCERS  *URINARY PROBLEMS  *BOWEL PROBLEMS  UNUSUAL RASH Items with * indicate a potential emergency and should be followed up as soon as possible.  Feel free to call the clinic you have any questions or concerns. The clinic phone number is [phone].  Please show the CHEMO ALERT CARD at check-in to the Emergency Department and triage nurse.   

## 2016-05-11 ENCOUNTER — Other Ambulatory Visit (HOSPITAL_BASED_OUTPATIENT_CLINIC_OR_DEPARTMENT_OTHER): Payer: Medicare Other

## 2016-05-11 ENCOUNTER — Ambulatory Visit (HOSPITAL_BASED_OUTPATIENT_CLINIC_OR_DEPARTMENT_OTHER): Payer: Medicare Other | Admitting: Internal Medicine

## 2016-05-11 ENCOUNTER — Encounter: Payer: Self-pay | Admitting: Internal Medicine

## 2016-05-11 ENCOUNTER — Ambulatory Visit (HOSPITAL_BASED_OUTPATIENT_CLINIC_OR_DEPARTMENT_OTHER): Payer: Medicare Other

## 2016-05-11 VITALS — BP 164/68 | HR 81 | Temp 97.7°F | Resp 20 | Ht 71.0 in | Wt 155.8 lb

## 2016-05-11 DIAGNOSIS — C3431 Malignant neoplasm of lower lobe, right bronchus or lung: Secondary | ICD-10-CM | POA: Diagnosis not present

## 2016-05-11 DIAGNOSIS — Z5112 Encounter for antineoplastic immunotherapy: Secondary | ICD-10-CM

## 2016-05-11 DIAGNOSIS — C7971 Secondary malignant neoplasm of right adrenal gland: Secondary | ICD-10-CM

## 2016-05-11 DIAGNOSIS — I4891 Unspecified atrial fibrillation: Secondary | ICD-10-CM | POA: Diagnosis not present

## 2016-05-11 LAB — CBC WITH DIFFERENTIAL/PLATELET
BASO%: 0.7 % (ref 0.0–2.0)
Basophils Absolute: 0 10*3/uL (ref 0.0–0.1)
EOS%: 3.9 % (ref 0.0–7.0)
Eosinophils Absolute: 0.2 10*3/uL (ref 0.0–0.5)
HCT: 43.6 % (ref 38.4–49.9)
HGB: 14.6 g/dL (ref 13.0–17.1)
LYMPH#: 1.2 10*3/uL (ref 0.9–3.3)
LYMPH%: 22.7 % (ref 14.0–49.0)
MCH: 31.9 pg (ref 27.2–33.4)
MCHC: 33.5 g/dL (ref 32.0–36.0)
MCV: 95.1 fL (ref 79.3–98.0)
MONO#: 0.5 10*3/uL (ref 0.1–0.9)
MONO%: 9.8 % (ref 0.0–14.0)
NEUT#: 3.2 10*3/uL (ref 1.5–6.5)
NEUT%: 62.9 % (ref 39.0–75.0)
Platelets: 205 10*3/uL (ref 140–400)
RBC: 4.58 10*6/uL (ref 4.20–5.82)
RDW: 14.4 % (ref 11.0–14.6)
WBC: 5.1 10*3/uL (ref 4.0–10.3)

## 2016-05-11 LAB — COMPREHENSIVE METABOLIC PANEL
ALT: 13 U/L (ref 0–55)
AST: 20 U/L (ref 5–34)
Albumin: 3.9 g/dL (ref 3.5–5.0)
Alkaline Phosphatase: 98 U/L (ref 40–150)
Anion Gap: 9 mEq/L (ref 3–11)
BUN: 20 mg/dL (ref 7.0–26.0)
CHLORIDE: 102 meq/L (ref 98–109)
CO2: 24 meq/L (ref 22–29)
CREATININE: 0.9 mg/dL (ref 0.7–1.3)
Calcium: 9.5 mg/dL (ref 8.4–10.4)
EGFR: 84 mL/min/{1.73_m2} — ABNORMAL LOW (ref >90)
GLUCOSE: 104 mg/dL (ref 70–140)
Potassium: 4.1 mEq/L (ref 3.5–5.1)
Sodium: 135 mEq/L — ABNORMAL LOW (ref 136–145)
Total Bilirubin: 0.54 mg/dL (ref 0.20–1.20)
Total Protein: 6.6 g/dL (ref 6.4–8.3)

## 2016-05-11 MED ORDER — SODIUM CHLORIDE 0.9 % IV SOLN
240.0000 mg | Freq: Once | INTRAVENOUS | Status: AC
  Administered 2016-05-11: 240 mg via INTRAVENOUS
  Filled 2016-05-11: qty 4

## 2016-05-11 MED ORDER — SODIUM CHLORIDE 0.9 % IV SOLN
Freq: Once | INTRAVENOUS | Status: AC
  Administered 2016-05-11: 15:00:00 via INTRAVENOUS

## 2016-05-11 NOTE — Progress Notes (Signed)
Anthony Curry Telephone:([phone]   Fax:(336) [place], [place] [address]  DIAGNOSIS: Stage IV (T1 A., N0, M1 B.) non-small cell lung cancer of the right lower lobe, adenocarcinoma with negative EGFR mutation and negative ALK gene translocation diagnosed in October of 2014.   Molecular profile: Negative for RET, ALK, BRAF, MET, EGFR.  Positive for ERBB2 A775T, [id number], KRAS G13E, MAP2K1 D67N (see full report).   PRIOR THERAPY:  1) Systemic chemotherapy with carboplatin for AUC of 5 and Alimta 500 mg/M2 every 3 weeks, status post 6 cycles with stable disease. First dose on 10/23/2013. 2) Maintenance systemic chemotherapy with single agent Alimta 500 mg/M2 every 3 weeks. Status post 8 cycles.   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 Sondra Come completed on 12/16/2014.  CURRENT THERAPY: Immunotherapy with Nivolumab 3 MG/KG every 2 weeks, status post 28 cycles. First cycle 01/28/2015  CHEMOTHERAPY INTENT: Palliative  CURRENT # OF CHEMOTHERAPY CYCLES: 29 CURRENT ANTIEMETICS: Zofran, Compazine and dexamethasone  CURRENT SMOKING STATUS: Former smoker  ORAL CHEMOTHERAPY AND CONSENT: None  CURRENT BISPHOSPHONATES USE: None  PAIN MANAGEMENT: 0/10  NARCOTICS INDUCED CONSTIPATION: None  LIVING WILL AND CODE STATUS: ?   INTERVAL HISTORY: Anthony Curry 75 y.o. male returns to the clinic today for followup visit accompanied by his wife. The patient has no complaints today. He has been tolerating his treatment fairly well with no significant adverse effects. He has no fever or chills. He denied having any significant chest pain, shortness of breath, cough or hemoptysis. He denied having any nausea or vomiting. No significant weight loss or night sweats. He has no skin rash or diarrhea. He is here today to start cycle #29 of his treatment.  MEDICAL  HISTORY: Past Medical History  Diagnosis Date  . Hypertension   . GERD (gastroesophageal reflux disease)   . 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 Persistent atrial fibrillation ([place])   . 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 BPH (benign prostatic hyperplasia)   . Anemia of chronic disease   . Pulmonary nodules   . Adrenal mass ([place])   . 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 Alcoholism in remission ([place])   . Radiation 12/02/14-12/16/14    Left adrenal gland 30 Gy in 10 fractions  . Complication of anesthesia 09/08/2015     JITTERY AFTER GALLBLADDER SURGERY  . Anxiety   . Cancer ([place])     small cell/lung  . Neoplastic malignant related fatigue     IV tx. every 2 weeks last9-14-16 (Dr. Earlie Server), radiation last tx. 6 months    ALLERGIES:  has No Known Allergies.  MEDICATIONS:  Current Outpatient Prescriptions  Medication Sig Dispense Refill  . ALPRAZolam (XANAX) 0.25 MG tablet Take 0.25 mg by mouth 3 (three) times daily as needed for anxiety or sleep. And also at night for sleep.    [name] apixaban (ELIQUIS) 5 MG TABS tablet Take 5 mg by mouth 2 (two) times daily.    . Ascorbic Acid (VITAMIN C PO) Take 1,000 mg by mouth daily.     . B Complex-C (SUPER B COMPLEX PO) Take 1 each by mouth daily.     . calcium carbonate (OS-CAL) 600 MG TABS Take 600 mg by mouth daily with breakfast.    . demeclocycline (DECLOMYCIN) 150 MG tablet Take 150 mg by mouth 2 (two) times daily.    [name]  diltiazem (CARDIZEM CD) 180 MG 24 hr capsule Take 1 capsule (180 mg total) by mouth daily at 12 noon. 90 capsule 3  . furosemide (LASIX) 40 MG tablet Take 0.5 tablets (20 mg total) by mouth daily. (Patient taking differently: Take 20 mg by mouth every other day. ) 90 tablet 3  . HYDROcodone-acetaminophen (NORCO/VICODIN) 5-325 MG tablet Take 1-2 tablets by mouth every 4 (four) hours as needed for moderate pain. 30 tablet 0  . KLOR-CON M20 20 MEQ tablet Take 20 mEq by mouth every morning.     .  Lactobacillus (ACIDOPHILUS) CAPS capsule Take 1 capsule by mouth daily.    [name] MAGNESIUM PO Take 400 mg by mouth daily.     . mirtazapine (REMERON) 15 MG tablet Take 1 tablet (15 mg total) by mouth at bedtime. (Patient taking differently: Take 15 mg by mouth at bedtime as needed (sleep). ) 30 tablet 0  . Multiple Vitamin (MULTIVITAMIN WITH MINERALS) TABS Take 1 tablet by mouth daily.    . Nivolumab (OPDIVO IV) Inject as directed every 2 weeks    . Nutritional Supplements (ENSURE COMPLETE PO) Take 1 Can by mouth 2 (two) times daily.    . Omega-3 Fatty Acids (FISH OIL PO) Take 1 tablet by mouth daily.    . pantoprazole (PROTONIX) 40 MG tablet Take 40 mg by mouth every morning.  8  . tamsulosin (FLOMAX) 0.4 MG CAPS capsule Take 0.4 mg by mouth daily at 12 noon.     . thiamine (VITAMIN B-1) 100 MG tablet Take 1 tablet (100 mg total) by mouth daily. 30 tablet 6  . vitamin E 400 UNIT capsule Take 400 Units by mouth daily.     No current facility-administered medications for this visit.    SURGICAL HISTORY:  Past Surgical History  Procedure Laterality Date  . Hemorrhoid surgery    . Ankle arthrodesis  1995    rt fx-hardware in  . Tonsillectomy    . Colonoscopy    . Excision / curettage bone cyst finger  2005    rt thumb  . Total shoulder arthroplasty  02/03/2012    Procedure: TOTAL SHOULDER ARTHROPLASTY;  Surgeon: [name], MD;  Location: [facility];  Service: Orthopedics;  Laterality: Left;  . Cardioversion N/A 09/29/2014    Procedure: CARDIOVERSION;  Surgeon: Josue Hector, MD;  Location: [facility] ENDOSCOPY;  Service: Cardiovascular;  Laterality: N/A;  . Percutaneous cholecytostomy tube      IVR insertion 06-13-15(Dr. Vernard Gambles)- 08-28-15 remains in place to drainage bag  . Ercp N/A 09/02/2015    Procedure: ENDOSCOPIC RETROGRADE CHOLANGIOPANCREATOGRAPHY (ERCP);  Surgeon: Arta Silence, MD;  Location: [facility];  Service: Endoscopy;  Laterality: N/A;  . Ercp N/A 09/07/2015     Procedure: ENDOSCOPIC RETROGRADE CHOLANGIOPANCREATOGRAPHY (ERCP);  Surgeon: Clarene Essex, MD;  Location: [facility];  Service: Endoscopy;  Laterality: N/A;  . Cholecystectomy  09/08/2015     laproscopic   . Cholecystectomy N/A 09/08/2015    Procedure: LAPAROSCOPIC CHOLECYSTECTOMY WITH ATTEMPTED INTRAOPERATIVE CHOLANGIOGRAM ;  Surgeon: Fanny Skates, MD;  Location: [facility];  Service: General;  Laterality: N/A;    REVIEW OF SYSTEMS:  A comprehensive review of systems was negative.   PHYSICAL EXAMINATION: General appearance: alert, cooperative, fatigued and no distress Head: Normocephalic, without obvious abnormality, atraumatic Neck: no adenopathy, no JVD, supple, symmetrical, trachea midline and thyroid not enlarged, symmetric, no tenderness/mass/nodules Lymph nodes: Cervical, supraclavicular, and axillary nodes normal. Resp: clear to auscultation bilaterally Back: symmetric, no curvature. ROM  normal. No CVA tenderness. Cardio: regular rate and rhythm, S1, S2 normal, no murmur, click, rub or gallop GI: Right upper quadrant tenderness to palpation Extremities: extremities normal, atraumatic, no cyanosis or edema Neurologic: Alert and oriented X 3, normal strength and tone. Normal symmetric reflexes. Normal coordination and gait  ECOG PERFORMANCE STATUS: 1 - Symptomatic but completely ambulatory  Blood pressure 164/68, pulse 81, temperature 97.7 F (36.5 C), temperature source Oral, resp. rate 20, height '5\' 11"'$  (1.803 m), weight 155 lb 12.8 oz (70.67 kg), SpO2 99 %.  LABORATORY DATA: Lab Results  Component Value Date   WBC 5.1 05/11/2016   HGB 14.6 05/11/2016   HCT 43.6 05/11/2016   MCV 95.1 05/11/2016   PLT 205 05/11/2016      Chemistry      Component Value Date/Time   NA 134* 04/27/2016 1103   NA 129* 01/07/2016 1844   K 3.8 04/27/2016 1103   K 4.0 01/07/2016 1844   CL 94* 01/07/2016 1844   CO2 23 04/27/2016 1103   CO2 22 01/07/2016 1844   BUN 16.9 04/27/2016 1103   BUN 16  01/07/2016 1844   CREATININE 0.8 04/27/2016 1103   CREATININE 0.85 01/07/2016 1844      Component Value Date/Time   CALCIUM 9.5 04/27/2016 1103   CALCIUM 8.7* 01/07/2016 1844   ALKPHOS 98 04/27/2016 1103   ALKPHOS 115 01/07/2016 1844   AST 21 04/27/2016 1103   AST 38 01/07/2016 1844   ALT 15 04/27/2016 1103   ALT 22 01/07/2016 1844   BILITOT 0.70 04/27/2016 1103   BILITOT 1.2 01/07/2016 1844       RADIOGRAPHIC STUDIES: No results found. ASSESSMENT AND PLAN: This is a very pleasant 75 years old white male with:  1)  Stage IV non-small cell lung cancer, adenocarcinoma status post induction chemotherapy with carboplatin and Alimta and currently on maintenance treatment with single agent Alimta status post 8 cycles. He has been observation for the last few months.  He had evidence for disease progression on the left adrenal gland and the patient underwent palliative radiotherapy to the left adrenal gland under the care of Dr. Sondra Come completed on 12/16/2014. He is currently undergoing treatment with immunotherapy with Nivolumab status post 28 cycles and tolerating his treatment fairly well.  The patient is doing fine today. I recommended for him to proceed with cycle #29 today as scheduled. The patient would come back for follow-up visit in 2 weeks with the start of cycle #30.  2) Atrial fibrillation: He'll continue his current medication with Cardizem, digoxin and metoprolol in addition to Eliquis. He'll also continue his routine followup visit with his cardiologist Dr. [name].  He was advised to call immediately if he has any concerning symptoms in the interval. The patient voices understanding of current disease status and treatment options and is in agreement with the current care plan.  All questions were answered. The patient knows to call the clinic with any problems, questions or concerns. We can certainly see the patient much sooner if necessary.  Disclaimer: This note was  dictated with voice recognition software. Similar sounding words can inadvertently be transcribed and may not be corrected upon review.

## 2016-05-11 NOTE — Patient Instructions (Signed)
[facility] Discharge Instructions for Patients Receiving Chemotherapy  Today you received the following chemotherapy agents:  Nivolumab.  To help prevent nausea and vomiting after your treatment, we encourage you to take your nausea medication as directed.   If you develop nausea and vomiting that is not controlled by your nausea medication, call the clinic.   BELOW ARE SYMPTOMS THAT SHOULD BE REPORTED IMMEDIATELY:  *FEVER GREATER THAN 100.5 F  *CHILLS WITH OR WITHOUT FEVER  NAUSEA AND VOMITING THAT IS NOT CONTROLLED WITH YOUR NAUSEA MEDICATION  *UNUSUAL SHORTNESS OF BREATH  *UNUSUAL BRUISING OR BLEEDING  TENDERNESS IN MOUTH AND THROAT WITH OR WITHOUT PRESENCE OF ULCERS  *URINARY PROBLEMS  *BOWEL PROBLEMS  UNUSUAL RASH Items with * indicate a potential emergency and should be followed up as soon as possible.  Feel free to call the clinic you have any questions or concerns. The clinic phone number is [phone].  Please show the CHEMO ALERT CARD at check-in to the Emergency Department and triage nurse.   

## 2016-05-25 ENCOUNTER — Ambulatory Visit (HOSPITAL_BASED_OUTPATIENT_CLINIC_OR_DEPARTMENT_OTHER): Payer: Medicare Other | Admitting: Internal Medicine

## 2016-05-25 ENCOUNTER — Ambulatory Visit (HOSPITAL_BASED_OUTPATIENT_CLINIC_OR_DEPARTMENT_OTHER): Payer: Medicare Other

## 2016-05-25 ENCOUNTER — Other Ambulatory Visit (HOSPITAL_BASED_OUTPATIENT_CLINIC_OR_DEPARTMENT_OTHER): Payer: Medicare Other

## 2016-05-25 ENCOUNTER — Encounter: Payer: Self-pay | Admitting: Internal Medicine

## 2016-05-25 VITALS — BP 137/68 | HR 85 | Temp 97.9°F | Resp 18 | Ht 71.0 in | Wt 155.0 lb

## 2016-05-25 DIAGNOSIS — C3431 Malignant neoplasm of lower lobe, right bronchus or lung: Secondary | ICD-10-CM | POA: Diagnosis not present

## 2016-05-25 DIAGNOSIS — I4891 Unspecified atrial fibrillation: Secondary | ICD-10-CM

## 2016-05-25 DIAGNOSIS — Z5112 Encounter for antineoplastic immunotherapy: Secondary | ICD-10-CM

## 2016-05-25 DIAGNOSIS — Z79899 Other long term (current) drug therapy: Secondary | ICD-10-CM | POA: Diagnosis not present

## 2016-05-25 DIAGNOSIS — C7971 Secondary malignant neoplasm of right adrenal gland: Secondary | ICD-10-CM

## 2016-05-25 DIAGNOSIS — C3490 Malignant neoplasm of unspecified part of unspecified bronchus or lung: Secondary | ICD-10-CM

## 2016-05-25 DIAGNOSIS — C3491 Malignant neoplasm of unspecified part of right bronchus or lung: Secondary | ICD-10-CM

## 2016-05-25 LAB — CBC WITH DIFFERENTIAL/PLATELET
BASO%: 0.7 % (ref 0.0–2.0)
BASOS ABS: 0 10*3/uL (ref 0.0–0.1)
EOS ABS: 0.2 10*3/uL (ref 0.0–0.5)
EOS%: 5 % (ref 0.0–7.0)
HEMATOCRIT: 43.7 % (ref 38.4–49.9)
HEMOGLOBIN: 14.8 g/dL (ref 13.0–17.1)
LYMPH#: 1.1 10*3/uL (ref 0.9–3.3)
LYMPH%: 26.2 % (ref 14.0–49.0)
MCH: 32.1 pg (ref 27.2–33.4)
MCHC: 33.9 g/dL (ref 32.0–36.0)
MCV: 94.5 fL (ref 79.3–98.0)
MONO#: 0.6 10*3/uL (ref 0.1–0.9)
MONO%: 13.6 % (ref 0.0–14.0)
NEUT#: 2.3 10*3/uL (ref 1.5–6.5)
NEUT%: 54.5 % (ref 39.0–75.0)
PLATELETS: 228 10*3/uL (ref 140–400)
RBC: 4.63 10*6/uL (ref 4.20–5.82)
RDW: 14.3 % (ref 11.0–14.6)
WBC: 4.2 10*3/uL (ref 4.0–10.3)

## 2016-05-25 LAB — COMPREHENSIVE METABOLIC PANEL
ALBUMIN: 3.9 g/dL (ref 3.5–5.0)
ALK PHOS: 81 U/L (ref 40–150)
ALT: 17 U/L (ref 0–55)
ANION GAP: 9 meq/L (ref 3–11)
AST: 21 U/L (ref 5–34)
BUN: 15.2 mg/dL (ref 7.0–26.0)
CALCIUM: 9.4 mg/dL (ref 8.4–10.4)
CHLORIDE: 100 meq/L (ref 98–109)
CO2: 24 mEq/L (ref 22–29)
Creatinine: 0.9 mg/dL (ref 0.7–1.3)
EGFR: 83 mL/min/{1.73_m2} — AB (ref >90)
Glucose: 110 mg/dl (ref 70–140)
POTASSIUM: 3.9 meq/L (ref 3.5–5.1)
Sodium: 133 mEq/L — ABNORMAL LOW (ref 136–145)
Total Bilirubin: 0.72 mg/dL (ref 0.20–1.20)
Total Protein: 6.5 g/dL (ref 6.4–8.3)

## 2016-05-25 LAB — LACTATE DEHYDROGENASE: LDH: 144 U/L (ref 125–245)

## 2016-05-25 LAB — TSH: TSH: 4.856 m[IU]/L — AB (ref 0.320–4.118)

## 2016-05-25 MED ORDER — SODIUM CHLORIDE 0.9 % IV SOLN
240.0000 mg | Freq: Once | INTRAVENOUS | Status: AC
  Administered 2016-05-25: 240 mg via INTRAVENOUS
  Filled 2016-05-25: qty 20

## 2016-05-25 MED ORDER — SODIUM CHLORIDE 0.9 % IV SOLN
Freq: Once | INTRAVENOUS | Status: AC
  Administered 2016-05-25: 13:00:00 via INTRAVENOUS

## 2016-05-25 NOTE — Progress Notes (Signed)
[place] [facility] [phone]   Fax:(336) [place], [place] [address]  DIAGNOSIS: Stage IV (T1a, N0, M1b) non-small cell lung cancer of the right lower lobe, adenocarcinoma with negative EGFR mutation and negative ALK gene translocation diagnosed in October of 2014.   Molecular profile: Negative for RET, ALK, BRAF, MET, EGFR.  Positive for ERBB2 A775T, [id number], KRAS G13E, MAP2K1 D67N (see full report).   PRIOR THERAPY:  1) Systemic chemotherapy with carboplatin for AUC of 5 and Alimta 500 mg/M2 every 3 weeks, status post 6 cycles with stable disease. First dose on 10/23/2013. 2) Maintenance systemic chemotherapy with single agent Alimta 500 mg/M2 every 3 weeks. Status post 8 cycles.   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 Sondra Come completed on 12/16/2014.  CURRENT THERAPY: Immunotherapy with Nivolumab 3 MG/KG every 2 weeks, status post 29 cycles. First cycle 01/28/2015  CHEMOTHERAPY INTENT: Palliative  CURRENT # OF CHEMOTHERAPY CYCLES: 30 CURRENT ANTIEMETICS: Zofran, Compazine and dexamethasone  CURRENT SMOKING STATUS: Former smoker  ORAL CHEMOTHERAPY AND CONSENT: None  CURRENT BISPHOSPHONATES USE: None  PAIN MANAGEMENT: 0/10  NARCOTICS INDUCED CONSTIPATION: None  LIVING WILL AND CODE STATUS: ?   INTERVAL HISTORY: Anthony Curry 75 y.o. male returns to the clinic today for followup visit accompanied by his wife. The patient has no complaints today. He tolerated the last cycle of his treatment fairly well with no significant adverse effects. He has no fever or chills. He denied having any significant chest pain, shortness of breath, cough or hemoptysis. He denied having any nausea or vomiting. No significant weight loss or night sweats. He has no skin rash or diarrhea. He is here today to start cycle #30 of his treatment.  MEDICAL  HISTORY: Past Medical History  Diagnosis Date  . Hypertension   . GERD (gastroesophageal reflux disease)   . 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 Persistent atrial fibrillation ([place])   . 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 BPH (benign prostatic hyperplasia)   . Anemia of chronic disease   . Pulmonary nodules   . Adrenal mass ([place])   . 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 Alcoholism in remission ([place])   . Radiation 12/02/14-12/16/14    Left adrenal gland 30 Gy in 10 fractions  . Complication of anesthesia 09/08/2015     JITTERY AFTER GALLBLADDER SURGERY  . Anxiety   . Cancer ([place])     small cell/lung  . Neoplastic malignant related fatigue     IV tx. every 2 weeks last9-14-16 (Dr. Earlie Server), radiation last tx. 6 months    ALLERGIES:  has No Known Allergies.  MEDICATIONS:  Current Outpatient Prescriptions  Medication Sig Dispense Refill  . ALPRAZolam (XANAX) 0.25 MG tablet Take 0.25 mg by mouth 3 (three) times daily as needed for anxiety or sleep. And also at night for sleep.    [name] apixaban (ELIQUIS) 5 MG TABS tablet Take 5 mg by mouth 2 (two) times daily.    . Ascorbic Acid (VITAMIN C PO) Take 1,000 mg by mouth daily.     . B Complex-C (SUPER B COMPLEX PO) Take 1 each by mouth daily.     . calcium carbonate (OS-CAL) 600 MG TABS Take 600 mg by mouth daily with breakfast.    . demeclocycline (DECLOMYCIN) 150 MG tablet Take 150 mg by mouth 2 (two) times daily.    [name]  diltiazem (CARDIZEM CD) 180 MG 24 hr capsule Take 1 capsule (180 mg total) by mouth daily at 12 noon. 90 capsule 3  . furosemide (LASIX) 40 MG tablet Take 0.5 tablets (20 mg total) by mouth daily. (Patient taking differently: Take 20 mg by mouth every other day. ) 90 tablet 3  . HYDROcodone-acetaminophen (NORCO/VICODIN) 5-325 MG tablet Take 1-2 tablets by mouth every 4 (four) hours as needed for moderate pain. 30 tablet 0  . KLOR-CON M20 20 MEQ tablet Take 20 mEq by mouth every morning.     .  Lactobacillus (ACIDOPHILUS) CAPS capsule Take 1 capsule by mouth daily.    [name] MAGNESIUM PO Take 400 mg by mouth daily.     . mirtazapine (REMERON) 15 MG tablet Take 1 tablet (15 mg total) by mouth at bedtime. (Patient taking differently: Take 15 mg by mouth at bedtime as needed (sleep). ) 30 tablet 0  . Multiple Vitamin (MULTIVITAMIN WITH MINERALS) TABS Take 1 tablet by mouth daily.    . Nivolumab (OPDIVO IV) Inject as directed every 2 weeks    . Nutritional Supplements (ENSURE COMPLETE PO) Take 1 Can by mouth 2 (two) times daily.    . Omega-3 Fatty Acids (FISH OIL PO) Take 1 tablet by mouth daily.    . pantoprazole (PROTONIX) 40 MG tablet Take 40 mg by mouth every morning.  8  . tamsulosin (FLOMAX) 0.4 MG CAPS capsule Take 0.4 mg by mouth daily at 12 noon.     . thiamine (VITAMIN B-1) 100 MG tablet Take 1 tablet (100 mg total) by mouth daily. 30 tablet 6  . vitamin E 400 UNIT capsule Take 400 Units by mouth daily.     No current facility-administered medications for this visit.    SURGICAL HISTORY:  Past Surgical History  Procedure Laterality Date  . Hemorrhoid surgery    . Ankle arthrodesis  1995    rt fx-hardware in  . Tonsillectomy    . Colonoscopy    . Excision / curettage bone cyst finger  2005    rt thumb  . Total shoulder arthroplasty  02/03/2012    Procedure: TOTAL SHOULDER ARTHROPLASTY;  Surgeon: [name], MD;  Location: [facility];  Service: Orthopedics;  Laterality: Left;  . Cardioversion N/A 09/29/2014    Procedure: CARDIOVERSION;  Surgeon: Josue Hector, MD;  Location: [facility] ENDOSCOPY;  Service: Cardiovascular;  Laterality: N/A;  . Percutaneous cholecytostomy tube      IVR insertion 06-13-15(Dr. Vernard Gambles)- 08-28-15 remains in place to drainage bag  . Ercp N/A 09/02/2015    Procedure: ENDOSCOPIC RETROGRADE CHOLANGIOPANCREATOGRAPHY (ERCP);  Surgeon: Arta Silence, MD;  Location: [facility] ENDOS[facility];  Service: Endoscopy;  Laterality: N/A;  . Ercp N/A 09/07/2015     Procedure: ENDOSCOPIC RETROGRADE CHOLANGIOPANCREATOGRAPHY (ERCP);  Surgeon: Clarene Essex, MD;  Location: [facility];  Service: Endoscopy;  Laterality: N/A;  . Cholecystectomy  09/08/2015     laproscopic   . Cholecystectomy N/A 09/08/2015    Procedure: LAPAROSCOPIC CHOLECYSTECTOMY WITH ATTEMPTED INTRAOPERATIVE CHOLANGIOGRAM ;  Surgeon: Fanny Skates, MD;  Location: [facility];  Service: General;  Laterality: N/A;    REVIEW OF SYSTEMS:  A comprehensive review of systems was negative.   PHYSICAL EXAMINATION: General appearance: alert, cooperative, fatigued and no distress Head: Normocephalic, without obvious abnormality, atraumatic Neck: no adenopathy, no JVD, supple, symmetrical, trachea midline and thyroid not enlarged, symmetric, no tenderness/mass/nodules Lymph nodes: Cervical, supraclavicular, and axillary nodes normal. Resp: clear to auscultation bilaterally Back: symmetric, no curvature. ROM  normal. No CVA tenderness. Cardio: regular rate and rhythm, S1, S2 normal, no murmur, click, rub or gallop GI: Right upper quadrant tenderness to palpation Extremities: extremities normal, atraumatic, no cyanosis or edema Neurologic: Alert and oriented X 3, normal strength and tone. Normal symmetric reflexes. Normal coordination and gait  ECOG PERFORMANCE STATUS: 1 - Symptomatic but completely ambulatory  Blood pressure 137/68, pulse 85, temperature 97.9 F (36.6 C), temperature source Oral, resp. rate 18, height 5' 11" (1.803 m), weight 155 lb (70.308 kg), SpO2 98 %.  LABORATORY DATA: Lab Results  Component Value Date   WBC 4.2 05/25/2016   HGB 14.8 05/25/2016   HCT 43.7 05/25/2016   MCV 94.5 05/25/2016   PLT 228 05/25/2016      Chemistry      Component Value Date/Time   NA 133* 05/25/2016 1126   NA 129* 01/07/2016 1844   K 3.9 05/25/2016 1126   K 4.0 01/07/2016 1844   CL 94* 01/07/2016 1844   CO2 24 05/25/2016 1126   CO2 22 01/07/2016 1844   BUN 15.2 05/25/2016 1126   BUN 16  01/07/2016 1844   CREATININE 0.9 05/25/2016 1126   CREATININE 0.85 01/07/2016 1844      Component Value Date/Time   CALCIUM 9.4 05/25/2016 1126   CALCIUM 8.7* 01/07/2016 1844   ALKPHOS 81 05/25/2016 1126   ALKPHOS 115 01/07/2016 1844   AST 21 05/25/2016 1126   AST 38 01/07/2016 1844   ALT 17 05/25/2016 1126   ALT 22 01/07/2016 1844   BILITOT 0.72 05/25/2016 1126   BILITOT 1.2 01/07/2016 1844       RADIOGRAPHIC STUDIES: No results found. ASSESSMENT AND PLAN: This is a very pleasant 75 years old white male with:  1)  Stage IV non-small cell lung cancer, adenocarcinoma status post induction chemotherapy with carboplatin and Alimta and currently on maintenance treatment with single agent Alimta status post 8 cycles. He has been observation for the last few months.  He had evidence for disease progression on the left adrenal gland and the patient underwent palliative radiotherapy to the left adrenal gland under the care of Dr. Sondra Come completed on 12/16/2014. He is currently undergoing treatment with immunotherapy with Nivolumab status post 29 cycles and tolerating his treatment fairly well.  The patient is doing fine today. I recommended for him to proceed with cycle #30 today as scheduled. The patient would come back for follow-up visit in 2 weeks with the start of cycle #31After repeating CT scan of the chest, abdomen and pelvis for restaging of his disease.  2) Atrial fibrillation: He'll continue his current medication with Cardizem, digoxin and metoprolol in addition to Eliquis as prescribed by his cardiologist Dr. [name].  He was advised to call immediately if he has any concerning symptoms in the interval. The patient voices understanding of current disease status and treatment options and is in agreement with the current care plan.  All questions were answered. The patient knows to call the clinic with any problems, questions or concerns. We can certainly see the patient much  sooner if necessary.  Disclaimer: This note was dictated with voice recognition software. Similar sounding words can inadvertently be transcribed and may not be corrected upon review.

## 2016-05-25 NOTE — Patient Instructions (Signed)
[facility] Discharge Instructions for Patients Receiving Chemotherapy  Today you received the following chemotherapy agents:  nivolumab.  To help prevent nausea and vomiting after your treatment, we encourage you to take your nausea medication as prescribed.   If you develop nausea and vomiting that is not controlled by your nausea medication, call the clinic.   BELOW ARE SYMPTOMS THAT SHOULD BE REPORTED IMMEDIATELY:  *FEVER GREATER THAN 100.5 F  *CHILLS WITH OR WITHOUT FEVER  NAUSEA AND VOMITING THAT IS NOT CONTROLLED WITH YOUR NAUSEA MEDICATION  *UNUSUAL SHORTNESS OF BREATH  *UNUSUAL BRUISING OR BLEEDING  TENDERNESS IN MOUTH AND THROAT WITH OR WITHOUT PRESENCE OF ULCERS  *URINARY PROBLEMS  *BOWEL PROBLEMS  UNUSUAL RASH Items with * indicate a potential emergency and should be followed up as soon as possible.  Feel free to call the clinic you have any questions or concerns. The clinic phone number is [phone].  Please show the CHEMO ALERT CARD at check-in to the Emergency Department and triage nurse.   

## 2016-06-06 ENCOUNTER — Encounter (HOSPITAL_COMMUNITY): Payer: Self-pay

## 2016-06-06 ENCOUNTER — Ambulatory Visit (HOSPITAL_COMMUNITY)
Admission: RE | Admit: 2016-06-06 | Discharge: 2016-06-06 | Disposition: A | Discharge status: Home / Self Care | Payer: Medicare Other | Source: Ambulatory Visit | Attending: Internal Medicine | Admitting: Internal Medicine

## 2016-06-06 DIAGNOSIS — Z5112 Encounter for antineoplastic immunotherapy: Secondary | ICD-10-CM | POA: Diagnosis not present

## 2016-06-06 DIAGNOSIS — K7689 Other specified diseases of liver: Secondary | ICD-10-CM | POA: Diagnosis not present

## 2016-06-06 DIAGNOSIS — R918 Other nonspecific abnormal finding of lung field: Secondary | ICD-10-CM | POA: Insufficient documentation

## 2016-06-06 DIAGNOSIS — C3431 Malignant neoplasm of lower lobe, right bronchus or lung: Secondary | ICD-10-CM | POA: Insufficient documentation

## 2016-06-06 MED ORDER — IOPAMIDOL (ISOVUE-300) INJECTION 61%
100.0000 mL | Freq: Once | INTRAVENOUS | Status: AC | PRN
  Administered 2016-06-06: 100 mL via INTRAVENOUS

## 2016-06-08 ENCOUNTER — Encounter: Payer: Self-pay | Admitting: Internal Medicine

## 2016-06-08 ENCOUNTER — Ambulatory Visit (HOSPITAL_BASED_OUTPATIENT_CLINIC_OR_DEPARTMENT_OTHER): Payer: Medicare Other

## 2016-06-08 ENCOUNTER — Other Ambulatory Visit (HOSPITAL_BASED_OUTPATIENT_CLINIC_OR_DEPARTMENT_OTHER): Payer: Medicare Other

## 2016-06-08 ENCOUNTER — Ambulatory Visit (HOSPITAL_BASED_OUTPATIENT_CLINIC_OR_DEPARTMENT_OTHER): Payer: Medicare Other | Admitting: Internal Medicine

## 2016-06-08 VITALS — BP 110/65 | HR 75 | Temp 97.8°F | Resp 18 | Ht 71.0 in | Wt 155.6 lb

## 2016-06-08 DIAGNOSIS — I4891 Unspecified atrial fibrillation: Secondary | ICD-10-CM

## 2016-06-08 DIAGNOSIS — Z7901 Long term (current) use of anticoagulants: Secondary | ICD-10-CM

## 2016-06-08 DIAGNOSIS — G893 Neoplasm related pain (acute) (chronic): Secondary | ICD-10-CM

## 2016-06-08 DIAGNOSIS — C3490 Malignant neoplasm of unspecified part of unspecified bronchus or lung: Secondary | ICD-10-CM

## 2016-06-08 DIAGNOSIS — C3431 Malignant neoplasm of lower lobe, right bronchus or lung: Secondary | ICD-10-CM

## 2016-06-08 DIAGNOSIS — Z5112 Encounter for antineoplastic immunotherapy: Secondary | ICD-10-CM

## 2016-06-08 DIAGNOSIS — C3491 Malignant neoplasm of unspecified part of right bronchus or lung: Secondary | ICD-10-CM

## 2016-06-08 LAB — CBC WITH DIFFERENTIAL/PLATELET
BASO%: 0.2 % (ref 0.0–2.0)
Basophils Absolute: 0 10*3/uL (ref 0.0–0.1)
EOS%: 6 % (ref 0.0–7.0)
Eosinophils Absolute: 0.3 10*3/uL (ref 0.0–0.5)
HEMATOCRIT: 40.5 % (ref 38.4–49.9)
HEMOGLOBIN: 14.6 g/dL (ref 13.0–17.1)
LYMPH%: 23.4 % (ref 14.0–49.0)
MCH: 32.6 pg (ref 27.2–33.4)
MCHC: 36 g/dL (ref 32.0–36.0)
MCV: 90.4 fL (ref 79.3–98.0)
MONO#: 0.5 10*3/uL (ref 0.1–0.9)
MONO%: 10.3 % (ref 0.0–14.0)
NEUT#: 2.6 10*3/uL (ref 1.5–6.5)
NEUT%: 60.1 % (ref 39.0–75.0)
PLATELETS: 223 10*3/uL (ref 140–400)
RBC: 4.48 10*6/uL (ref 4.20–5.82)
RDW: 14.2 % (ref 11.0–14.6)
WBC: 4.4 10*3/uL (ref 4.0–10.3)
lymph#: 1 10*3/uL (ref 0.9–3.3)

## 2016-06-08 LAB — COMPREHENSIVE METABOLIC PANEL
ALBUMIN: 3.9 g/dL (ref 3.5–5.0)
ALT: 20 U/L (ref 0–55)
ANION GAP: 11 meq/L (ref 3–11)
AST: 25 U/L (ref 5–34)
Alkaline Phosphatase: 95 U/L (ref 40–150)
BILIRUBIN TOTAL: 0.62 mg/dL (ref 0.20–1.20)
BUN: 15.1 mg/dL (ref 7.0–26.0)
CALCIUM: 9.6 mg/dL (ref 8.4–10.4)
CO2: 23 mEq/L (ref 22–29)
CREATININE: 0.9 mg/dL (ref 0.7–1.3)
Chloride: 99 mEq/L (ref 98–109)
EGFR: 78 mL/min/{1.73_m2} — ABNORMAL LOW (ref >90)
Glucose: 99 mg/dl (ref 70–140)
Potassium: 4.1 mEq/L (ref 3.5–5.1)
Sodium: 133 mEq/L — ABNORMAL LOW (ref 136–145)
Total Protein: 6.4 g/dL (ref 6.4–8.3)

## 2016-06-08 LAB — LACTATE DEHYDROGENASE: LDH: 168 U/L (ref 125–245)

## 2016-06-08 MED ORDER — SODIUM CHLORIDE 0.9 % IV SOLN
240.0000 mg | Freq: Once | INTRAVENOUS | Status: AC
  Administered 2016-06-08: 240 mg via INTRAVENOUS
  Filled 2016-06-08: qty 20

## 2016-06-08 MED ORDER — HYDROCODONE-ACETAMINOPHEN 5-325 MG PO TABS: 1.0000 | ORAL_TABLET | ORAL | Status: DC | PRN

## 2016-06-08 MED ORDER — SODIUM CHLORIDE 0.9 % IV SOLN
Freq: Once | INTRAVENOUS | Status: AC
  Administered 2016-06-08: 13:00:00 via INTRAVENOUS

## 2016-06-08 NOTE — Patient Instructions (Signed)
[facility] Discharge Instructions for Patients Receiving Chemotherapy  Today you received the following chemotherapy agents:  Nivolumab.  To help prevent nausea and vomiting after your treatment, we encourage you to take your nausea medication as directed.   If you develop nausea and vomiting that is not controlled by your nausea medication, call the clinic.   BELOW ARE SYMPTOMS THAT SHOULD BE REPORTED IMMEDIATELY:  *FEVER GREATER THAN 100.5 F  *CHILLS WITH OR WITHOUT FEVER  NAUSEA AND VOMITING THAT IS NOT CONTROLLED WITH YOUR NAUSEA MEDICATION  *UNUSUAL SHORTNESS OF BREATH  *UNUSUAL BRUISING OR BLEEDING  TENDERNESS IN MOUTH AND THROAT WITH OR WITHOUT PRESENCE OF ULCERS  *URINARY PROBLEMS  *BOWEL PROBLEMS  UNUSUAL RASH Items with * indicate a potential emergency and should be followed up as soon as possible.  Feel free to call the clinic you have any questions or concerns. The clinic phone number is [phone].  Please show the CHEMO ALERT CARD at check-in to the Emergency Department and triage nurse.   

## 2016-06-08 NOTE — Progress Notes (Signed)
Anthony [phone]   Fax:([phone]) [place], [place] [address]  DIAGNOSIS: Stage IV (T1a, N0, M1b) non-small cell lung cancer of the right lower lobe, adenocarcinoma with negative EGFR mutation and negative ALK gene translocation diagnosed in October of 2014.   Molecular profile: Negative for RET, ALK, BRAF, MET, EGFR.  Positive for ERBB2 A775T, AWN0P025I, KRAS G13E, MAP2K1 D67N (see full report).   PRIOR THERAPY:  1) Systemic chemotherapy with carboplatin for AUC of 5 and Alimta 500 mg/M2 every 3 weeks, status post 6 cycles with stable disease. First dose on 10/23/2013. 2) Maintenance systemic chemotherapy with single agent Alimta 500 mg/M2 every 3 weeks. Status post 8 cycles.   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 Sondra Come completed on 12/16/2014.  CURRENT THERAPY: Immunotherapy with Nivolumab 3 MG/KG every 2 weeks, status post 30 cycles. First cycle 01/28/2015  CHEMOTHERAPY INTENT: Palliative  CURRENT # OF CHEMOTHERAPY CYCLES: 31 CURRENT ANTIEMETICS: Zofran, Compazine and dexamethasone  CURRENT SMOKING STATUS: Former smoker  ORAL CHEMOTHERAPY AND CONSENT: None  CURRENT BISPHOSPHONATES USE: None  PAIN MANAGEMENT: 0/10  NARCOTICS INDUCED CONSTIPATION: None  LIVING WILL AND CODE STATUS: ?   INTERVAL HISTORY: Anthony Curry 75 y.o. male returns to the clinic today for followup visit accompanied by his wife. The patient has no complaints today. He tolerated the last cycle of his treatment fairly well with no significant adverse effects. He has no fever or chills. He denied having any significant chest pain, shortness of breath, cough or hemoptysis. He denied having any nausea or vomiting. No significant weight loss or night sweats. He has no skin rash or diarrhea. He had repeat CT scan of the chest, abdomen and pelvis performed  recently and he is here today for evaluation before starting cycle #31 of his treatment.  MEDICAL HISTORY: Past Medical History  Diagnosis Date  . Hypertension   . GERD (gastroesophageal reflux disease)   . 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 Persistent atrial fibrillation ([place])   . 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 BPH (benign prostatic hyperplasia)   . Anemia of chronic disease   . Pulmonary nodules   . Adrenal mass ([place])   . 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 Alcoholism in remission ([place])   . Radiation 12/02/14-12/16/14    Left adrenal gland 30 Gy in 10 fractions  . Complication of anesthesia 09/08/2015     JITTERY AFTER GALLBLADDER SURGERY  . Anxiety   . Cancer ([place])     small cell/lung  . Neoplastic malignant related fatigue     IV tx. every 2 weeks last9-14-16 (Dr. Earlie Server), radiation last tx. 6 months    ALLERGIES:  has No Known Allergies.  MEDICATIONS:  Current Outpatient Prescriptions  Medication Sig Dispense Refill  . ALPRAZolam (XANAX) 0.25 MG tablet Take 0.25 mg by mouth 3 (three) times daily as needed for anxiety or sleep. And also at night for sleep.    [name] apixaban (ELIQUIS) 5 MG TABS tablet Take 5 mg by mouth 2 (two) times daily.    . Ascorbic Acid (VITAMIN C PO) Take 1,000 mg by mouth daily.     . B Complex-C (SUPER B COMPLEX PO) Take 1 each by mouth daily.     . calcium carbonate (OS-CAL) 600 MG TABS Take 600 mg by mouth daily with breakfast.    .  demeclocycline (DECLOMYCIN) 150 MG tablet Take 150 mg by mouth 2 (two) times daily.    [name] diltiazem (CARDIZEM CD) 180 MG 24 hr capsule Take 1 capsule (180 mg total) by mouth daily at 12 noon. 90 capsule 3  . furosemide (LASIX) 40 MG tablet Take 0.5 tablets (20 mg total) by mouth daily. (Patient taking differently: Take 20 mg by mouth every other day. ) 90 tablet 3  . HYDROcodone-acetaminophen (NORCO/VICODIN) 5-325 MG tablet Take 1-2 tablets by mouth every 4 (four) hours as needed for moderate  pain. 30 tablet 0  . KLOR-CON M20 20 MEQ tablet Take 20 mEq by mouth every morning.     . Lactobacillus (ACIDOPHILUS) CAPS capsule Take 1 capsule by mouth daily.    [name] MAGNESIUM PO Take 400 mg by mouth daily.     . mirtazapine (REMERON) 15 MG tablet Take 1 tablet (15 mg total) by mouth at bedtime. (Patient taking differently: Take 15 mg by mouth at bedtime as needed (sleep). ) 30 tablet 0  . Multiple Vitamin (MULTIVITAMIN WITH MINERALS) TABS Take 1 tablet by mouth daily.    . Nivolumab (OPDIVO IV) Inject as directed every 2 weeks    . Nutritional Supplements (ENSURE COMPLETE PO) Take 1 Can by mouth 2 (two) times daily.    . Omega-3 Fatty Acids (FISH OIL PO) Take 1 tablet by mouth daily.    . pantoprazole (PROTONIX) 40 MG tablet Take 40 mg by mouth every morning.  8  . tamsulosin (FLOMAX) 0.4 MG CAPS capsule Take 0.4 mg by mouth daily at 12 noon.     . thiamine (VITAMIN B-1) 100 MG tablet Take 1 tablet (100 mg total) by mouth daily. 30 tablet 6  . vitamin E 400 UNIT capsule Take 400 Units by mouth daily.     No current facility-administered medications for this visit.    SURGICAL HISTORY:  Past Surgical History  Procedure Laterality Date  . Hemorrhoid surgery    . Ankle arthrodesis  1995    rt fx-hardware in  . Tonsillectomy    . Colonoscopy    . Excision / curettage bone cyst finger  2005    rt thumb  . Total shoulder arthroplasty  02/03/2012    Procedure: TOTAL SHOULDER ARTHROPLASTY;  Surgeon: [name], MD;  Location: [facility];  Service: Orthopedics;  Laterality: Left;  . Cardioversion N/A 09/29/2014    Procedure: CARDIOVERSION;  Surgeon: Josue Hector, MD;  Location: [facility] ENDOSCOPY;  Service: Cardiovascular;  Laterality: N/A;  . Percutaneous cholecytostomy tube      IVR insertion 06-13-15(Dr. Vernard Gambles)- 08-28-15 remains in place to drainage bag  . Ercp N/A 09/02/2015    Procedure: ENDOSCOPIC RETROGRADE CHOLANGIOPANCREATOGRAPHY (ERCP);  Surgeon: Arta Silence, MD;   Location: [facility] ENDOSCOPY;  Service: Endoscopy;  Laterality: N/A;  . Ercp N/A 09/07/2015    Procedure: ENDOSCOPIC RETROGRADE CHOLANGIOPANCREATOGRAPHY (ERCP);  Surgeon: Clarene Essex, MD;  Location: [facility] ENDOSCOPY;  Service: Endoscopy;  Laterality: N/A;  . Cholecystectomy  09/08/2015     laproscopic   . Cholecystectomy N/A 09/08/2015    Procedure: LAPAROSCOPIC CHOLECYSTECTOMY WITH ATTEMPTED INTRAOPERATIVE CHOLANGIOGRAM ;  Surgeon: Fanny Skates, MD;  Location: [place];  Service: General;  Laterality: N/A;    REVIEW OF SYSTEMS:  Constitutional: negative Eyes: negative Ears, nose, mouth, throat, and face: negative Respiratory: negative Cardiovascular: negative Gastrointestinal: negative Genitourinary:negative Integument/breast: negative Hematologic/lymphatic: negative Musculoskeletal:negative Neurological: negative Behavioral/Psych: negative Endocrine: negative Allergic/Immunologic: negative   PHYSICAL EXAMINATION: General appearance: alert, cooperative, fatigued and  no distress Head: Normocephalic, without obvious abnormality, atraumatic Neck: no adenopathy, no JVD, supple, symmetrical, trachea midline and thyroid not enlarged, symmetric, no tenderness/mass/nodules Lymph nodes: Cervical, supraclavicular, and axillary nodes normal. Resp: clear to auscultation bilaterally Back: symmetric, no curvature. ROM normal. No CVA tenderness. Cardio: regular rate and rhythm, S1, S2 normal, no murmur, click, rub or gallop GI: Right upper quadrant tenderness to palpation Extremities: extremities normal, atraumatic, no cyanosis or edema Neurologic: Alert and oriented X 3, normal strength and tone. Normal symmetric reflexes. Normal coordination and gait  ECOG PERFORMANCE STATUS: 1 - Symptomatic but completely ambulatory  Blood pressure 153/54, pulse 40, temperature 97.9 F (36.6 C), temperature source Oral, resp. rate 18, height _0  (1.803 m), weight 155 lb 9.6 oz (70.58 kg), SpO2 96 %.  LABORATORY  DATA: Lab Results  Component Value Date   WBC 4.4 06/08/2016   HGB 14.6 06/08/2016   HCT 40.5 06/08/2016   MCV 90.4 06/08/2016   PLT 223 06/08/2016      Chemistry      Component Value Date/Time   NA 133* 06/08/2016 1104   NA 129* 01/07/2016 1844   K 4.1 06/08/2016 1104   K 4.0 01/07/2016 1844   CL 94* 01/07/2016 1844   CO2 23 06/08/2016 1104   CO2 22 01/07/2016 1844   BUN 15.1 06/08/2016 1104   BUN 16 01/07/2016 1844   CREATININE 0.9 06/08/2016 1104   CREATININE 0.85 01/07/2016 1844      Component Value Date/Time   CALCIUM 9.6 06/08/2016 1104   CALCIUM 8.7* 01/07/2016 1844   ALKPHOS 95 06/08/2016 1104   ALKPHOS 115 01/07/2016 1844   AST 25 06/08/2016 1104   AST 38 01/07/2016 1844   ALT 20 06/08/2016 1104   ALT 22 01/07/2016 1844   BILITOT 0.62 06/08/2016 1104   BILITOT 1.2 01/07/2016 1844       RADIOGRAPHIC STUDIES: Ct Chest W Contrast  06/06/2016  CLINICAL DATA:  Lung cancer diagnosed in 2014. Chemotherapy and radiation therapy completed. Opdivo therapy therapy in progress. EXAM: CT CHEST, ABDOMEN, AND PELVIS WITH CONTRAST TECHNIQUE: Multidetector CT imaging of the chest, abdomen and pelvis was performed following the standard protocol during bolus administration of intravenous contrast. CONTRAST:  163m ISOVUE-300 IOPAMIDOL (ISOVUE-300) INJECTION 61% COMPARISON:  CT 03/29/2016 and 01/11/2016 FINDINGS: CT CHEST Cardiovascular: There is diffuse atherosclerosis of the aorta, great vessels and coronary arteries. No acute vascular findings are evident. The heart size is normal. There is a small amount of pericardial fluid anteriorly which appears unchanged. Mediastinum/Nodes: There are no enlarged mediastinal, hilar or axillary lymph nodes. There is a new subglottic 12 mm right tracheal nodule on image 23 of series 4 which may is likely an adherent secretion. The esophagus appears unremarkable. Lungs/Pleura: There is no pleural effusion. There is generally stable chronic lung  disease with emphysema, linear scarring at both lung bases and multiple ground-glass/sub solid nodular densities bilaterally. The largest ground-glass densities measure 2.9 x 1.8 cm in the right upper lobe on image 67 and 2.8 x 2.5 cm in the left upper lobe on image 70. 3 mm right lower lobe solid nodule on image 141 and 3 mm left upper lobe nodule on image 49 are unchanged. There is an enlarging nodular component along the inferior aspect of the otherwise stable linear scarring in the left upper lobe. This measures up to 11 x 7 mm transverse on image 38 of series 4. No other significant changes are seen. Musculoskeletal/Chest wall: No suspicious osseous findings.  Posttraumatic or postsurgical deformities of the right ribs are again noted. There are chronic degenerative changes at the right shoulder and asymmetric gynecomastia on the right. CT ABDOMEN AND PELVIS FINDINGS Hepatobiliary: The liver appears unchanged. There is a hypervascular lesion anteriorly in the right lobe measuring 7 mm on image 62. Low-density lesions on images 62, 76 and 80 are unchanged. Stable pneumobilia status post cholecystectomy. Pancreas: Unremarkable. No pancreatic ductal dilatation or surrounding inflammatory changes. Spleen: Normal in size without focal abnormality. Adrenals/Urinary Tract: The adrenal glands appear unchanged. The kidneys appear stable without suspicious findings. There is stable renal cortical scarring bilaterally. No evidence of hydronephrosis or urinary tract calculus. The bladder is distended and trabeculated with several small diverticula. Stomach/Bowel: No evidence of bowel wall thickening, distention or surrounding inflammatory change. The cecum is in the right mid abdomen. Scattered diverticula are noted. Vascular/Lymphatic: There are no enlarged abdominal or pelvic lymph nodes. Diffuse aortic and branch vessel atherosclerosis. Reproductive: The prostate gland appears stable. A 7 mm right periurethral  calcification on image 127 is unchanged from several prior studies. Other: No ascites or peritoneal nodularity. Musculoskeletal: No acute or significant osseous findings. There are extensive degenerative changes throughout the thoracolumbar spine with endplate osteophytes and subchondral cyst formation. IMPRESSION: 1. There is increased nodularity along the inferior aspect of the otherwise stable linear scarring in the left upper lobe. As this is the site of the patient's original malignancy, local recurrence is a consideration. Suggest either continued short-term CT follow-up or consideration of PET-CT. 2. The lungs are otherwise unchanged with multifocal scarring and ground-glass densities. There are no other enlarging solid components. 3. No evidence of metastatic disease within the abdomen or pelvis. There are stable hepatic lesions. 4. Stable additional incidental findings including atherosclerosis, right-sided gynecomastia and bladder trabeculation. Electronically Signed   By: Richardean Sale M.D.   On: 06/06/2016 11:09   Ct Abdomen Pelvis W Contrast  06/06/2016  CLINICAL DATA:  Lung cancer diagnosed in 2014. Chemotherapy and radiation therapy completed. Opdivo therapy therapy in progress. EXAM: CT CHEST, ABDOMEN, AND PELVIS WITH CONTRAST TECHNIQUE: Multidetector CT imaging of the chest, abdomen and pelvis was performed following the standard protocol during bolus administration of intravenous contrast. CONTRAST:  119m ISOVUE-300 IOPAMIDOL (ISOVUE-300) INJECTION 61% COMPARISON:  CT 03/29/2016 and 01/11/2016 FINDINGS: CT CHEST Cardiovascular: There is diffuse atherosclerosis of the aorta, great vessels and coronary arteries. No acute vascular findings are evident. The heart size is normal. There is a small amount of pericardial fluid anteriorly which appears unchanged. Mediastinum/Nodes: There are no enlarged mediastinal, hilar or axillary lymph nodes. There is a new subglottic 12 mm right tracheal nodule on  image 23 of series 4 which may is likely an adherent secretion. The esophagus appears unremarkable. Lungs/Pleura: There is no pleural effusion. There is generally stable chronic lung disease with emphysema, linear scarring at both lung bases and multiple ground-glass/sub solid nodular densities bilaterally. The largest ground-glass densities measure 2.9 x 1.8 cm in the right upper lobe on image 67 and 2.8 x 2.5 cm in the left upper lobe on image 70. 3 mm right lower lobe solid nodule on image 141 and 3 mm left upper lobe nodule on image 49 are unchanged. There is an enlarging nodular component along the inferior aspect of the otherwise stable linear scarring in the left upper lobe. This measures up to 11 x 7 mm transverse on image 38 of series 4. No other significant changes are seen. Musculoskeletal/Chest wall: No suspicious osseous findings. Posttraumatic  or postsurgical deformities of the right ribs are again noted. There are chronic degenerative changes at the right shoulder and asymmetric gynecomastia on the right. CT ABDOMEN AND PELVIS FINDINGS Hepatobiliary: The liver appears unchanged. There is a hypervascular lesion anteriorly in the right lobe measuring 7 mm on image 62. Low-density lesions on images 62, 76 and 80 are unchanged. Stable pneumobilia status post cholecystectomy. Pancreas: Unremarkable. No pancreatic ductal dilatation or surrounding inflammatory changes. Spleen: Normal in size without focal abnormality. Adrenals/Urinary Tract: The adrenal glands appear unchanged. The kidneys appear stable without suspicious findings. There is stable renal cortical scarring bilaterally. No evidence of hydronephrosis or urinary tract calculus. The bladder is distended and trabeculated with several small diverticula. Stomach/Bowel: No evidence of bowel wall thickening, distention or surrounding inflammatory change. The cecum is in the right mid abdomen. Scattered diverticula are noted. Vascular/Lymphatic: There  are no enlarged abdominal or pelvic lymph nodes. Diffuse aortic and branch vessel atherosclerosis. Reproductive: The prostate gland appears stable. A 7 mm right periurethral calcification on image 127 is unchanged from several prior studies. Other: No ascites or peritoneal nodularity. Musculoskeletal: No acute or significant osseous findings. There are extensive degenerative changes throughout the thoracolumbar spine with endplate osteophytes and subchondral cyst formation. IMPRESSION: 1. There is increased nodularity along the inferior aspect of the otherwise stable linear scarring in the left upper lobe. As this is the site of the patient's original malignancy, local recurrence is a consideration. Suggest either continued short-term CT follow-up or consideration of PET-CT. 2. The lungs are otherwise unchanged with multifocal scarring and ground-glass densities. There are no other enlarging solid components. 3. No evidence of metastatic disease within the abdomen or pelvis. There are stable hepatic lesions. 4. Stable additional incidental findings including atherosclerosis, right-sided gynecomastia and bladder trabeculation. Electronically Signed   By: Richardean Sale M.D.   On: 06/06/2016 11:09   ASSESSMENT AND PLAN: This is a very pleasant 75 years old white male with:  1)  Stage IV non-small cell lung cancer, adenocarcinoma status post induction chemotherapy with carboplatin and Alimta and currently on maintenance treatment with single agent Alimta status post 8 cycles. He has been observation for the last few months.  He had evidence for disease progression on the left adrenal gland and the patient underwent palliative radiotherapy to the left adrenal gland under the care of Dr. Sondra Come completed on 12/16/2014. He is currently undergoing treatment with immunotherapy with Nivolumab status post 30 cycles and tolerating his treatment fairly well.  The recent CT scan of the chest, abdomen and pelvis showed  no concerning finding for disease progression but the patient has increased nodularity in the linear scarring of the left upper lobe. I discussed the scan results and showed the images to the patient and his wife. I recommended for him to continue his current treatment with immunotherapy as a scheduled. He is scheduled to proceed with cycle #31 today. The patient would come back for follow-up visit in 2 weeks with the start of cycle #32.  2) Atrial fibrillation: He'll continue his current medication with Cardizem, digoxin and metoprolol in addition to Eliquis as prescribed by his cardiologist Dr. [name].  He was advised to call immediately if he has any concerning symptoms in the interval. The patient voices understanding of current disease status and treatment options and is in agreement with the current care plan.  All questions were answered. The patient knows to call the clinic with any problems, questions or concerns. We can certainly see the  patient much sooner if necessary.  Disclaimer: This note was dictated with voice recognition software. Similar sounding words can inadvertently be transcribed and may not be corrected upon review.

## 2016-06-22 ENCOUNTER — Ambulatory Visit (HOSPITAL_BASED_OUTPATIENT_CLINIC_OR_DEPARTMENT_OTHER): Payer: Medicare Other

## 2016-06-22 ENCOUNTER — Ambulatory Visit (HOSPITAL_BASED_OUTPATIENT_CLINIC_OR_DEPARTMENT_OTHER): Payer: Medicare Other | Admitting: Internal Medicine

## 2016-06-22 ENCOUNTER — Other Ambulatory Visit (HOSPITAL_BASED_OUTPATIENT_CLINIC_OR_DEPARTMENT_OTHER): Payer: Medicare Other

## 2016-06-22 ENCOUNTER — Encounter: Payer: Self-pay | Admitting: Internal Medicine

## 2016-06-22 VITALS — BP 137/67 | HR 82 | Temp 98.1°F | Resp 18 | Ht 71.0 in | Wt 157.1 lb

## 2016-06-22 DIAGNOSIS — C3431 Malignant neoplasm of lower lobe, right bronchus or lung: Secondary | ICD-10-CM | POA: Diagnosis not present

## 2016-06-22 DIAGNOSIS — R42 Dizziness and giddiness: Secondary | ICD-10-CM

## 2016-06-22 DIAGNOSIS — I4891 Unspecified atrial fibrillation: Secondary | ICD-10-CM

## 2016-06-22 DIAGNOSIS — C7971 Secondary malignant neoplasm of right adrenal gland: Secondary | ICD-10-CM

## 2016-06-22 DIAGNOSIS — Z5112 Encounter for antineoplastic immunotherapy: Secondary | ICD-10-CM

## 2016-06-22 DIAGNOSIS — C3491 Malignant neoplasm of unspecified part of right bronchus or lung: Secondary | ICD-10-CM

## 2016-06-22 LAB — COMPREHENSIVE METABOLIC PANEL
ALBUMIN: 3.8 g/dL (ref 3.5–5.0)
ALK PHOS: 93 U/L (ref 40–150)
ALT: 17 U/L (ref 0–55)
ANION GAP: 8 meq/L (ref 3–11)
AST: 20 U/L (ref 5–34)
BUN: 16.5 mg/dL (ref 7.0–26.0)
CO2: 25 mEq/L (ref 22–29)
Calcium: 9.7 mg/dL (ref 8.4–10.4)
Chloride: 98 mEq/L (ref 98–109)
Creatinine: 0.9 mg/dL (ref 0.7–1.3)
EGFR: 83 mL/min/{1.73_m2} — AB (ref >90)
GLUCOSE: 95 mg/dL (ref 70–140)
POTASSIUM: 4.7 meq/L (ref 3.5–5.1)
SODIUM: 131 meq/L — AB (ref 136–145)
Total Bilirubin: 0.67 mg/dL (ref 0.20–1.20)
Total Protein: 6.5 g/dL (ref 6.4–8.3)

## 2016-06-22 LAB — CBC WITH DIFFERENTIAL/PLATELET
BASO%: 0.4 % (ref 0.0–2.0)
BASOS ABS: 0 10*3/uL (ref 0.0–0.1)
EOS ABS: 0.2 10*3/uL (ref 0.0–0.5)
EOS%: 3.9 % (ref 0.0–7.0)
HCT: 42.5 % (ref 38.4–49.9)
HEMOGLOBIN: 14.6 g/dL (ref 13.0–17.1)
LYMPH%: 19.9 % (ref 14.0–49.0)
MCH: 32.5 pg (ref 27.2–33.4)
MCHC: 34.2 g/dL (ref 32.0–36.0)
MCV: 94.9 fL (ref 79.3–98.0)
MONO#: 0.6 10*3/uL (ref 0.1–0.9)
MONO%: 11.5 % (ref 0.0–14.0)
NEUT#: 3.4 10*3/uL (ref 1.5–6.5)
NEUT%: 64.3 % (ref 39.0–75.0)
Platelets: 218 10*3/uL (ref 140–400)
RBC: 4.48 10*6/uL (ref 4.20–5.82)
RDW: 14.5 % (ref 11.0–14.6)
WBC: 5.2 10*3/uL (ref 4.0–10.3)
lymph#: 1 10*3/uL (ref 0.9–3.3)

## 2016-06-22 LAB — LACTATE DEHYDROGENASE: LDH: 138 U/L (ref 125–245)

## 2016-06-22 MED ORDER — SODIUM CHLORIDE 0.9 % IV SOLN
240.0000 mg | Freq: Once | INTRAVENOUS | Status: AC
  Administered 2016-06-22: 240 mg via INTRAVENOUS
  Filled 2016-06-22: qty 8

## 2016-06-22 MED ORDER — SODIUM CHLORIDE 0.9 % IV SOLN
Freq: Once | INTRAVENOUS | Status: AC
  Administered 2016-06-22: 13:00:00 via INTRAVENOUS

## 2016-06-22 NOTE — Patient Instructions (Signed)
[facility] Discharge Instructions for Patients Receiving Chemotherapy  Today you received the following chemotherapy agents:  Nivolumab.  To help prevent nausea and vomiting after your treatment, we encourage you to take your nausea medication as directed.   If you develop nausea and vomiting that is not controlled by your nausea medication, call the clinic.   BELOW ARE SYMPTOMS THAT SHOULD BE REPORTED IMMEDIATELY:  *FEVER GREATER THAN 100.5 F  *CHILLS WITH OR WITHOUT FEVER  NAUSEA AND VOMITING THAT IS NOT CONTROLLED WITH YOUR NAUSEA MEDICATION  *UNUSUAL SHORTNESS OF BREATH  *UNUSUAL BRUISING OR BLEEDING  TENDERNESS IN MOUTH AND THROAT WITH OR WITHOUT PRESENCE OF ULCERS  *URINARY PROBLEMS  *BOWEL PROBLEMS  UNUSUAL RASH Items with * indicate a potential emergency and should be followed up as soon as possible.  Feel free to call the clinic you have any questions or concerns. The clinic phone number is [phone].  Please show the CHEMO ALERT CARD at check-in to the Emergency Department and triage nurse.   

## 2016-06-22 NOTE — Progress Notes (Signed)
[place] [phone]) [phone]   Fax:(336) [place], [place] [address]  DIAGNOSIS: Stage IV (T1a, N0, M1b) non-small cell lung cancer of the right lower lobe, adenocarcinoma with negative EGFR mutation and negative ALK gene translocation diagnosed in October of 2014.   Molecular profile: Negative for RET, ALK, BRAF, MET, EGFR.  Positive for ERBB2 A775T, [id number], KRAS G13E, MAP2K1 D67N (see full report).   PRIOR THERAPY:  1) Systemic chemotherapy with carboplatin for AUC of 5 and Alimta 500 mg/M2 every 3 weeks, status post 6 cycles with stable disease. First dose on 10/23/2013. 2) Maintenance systemic chemotherapy with single agent Alimta 500 mg/M2 every 3 weeks. Status post 8 cycles.   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 Sondra Come completed on 12/16/2014.  CURRENT THERAPY: Immunotherapy with Nivolumab 3 MG/KG every 2 weeks, status post 31 cycles. First cycle 01/28/2015  CHEMOTHERAPY INTENT: Palliative  CURRENT # OF CHEMOTHERAPY CYCLES: 32 CURRENT ANTIEMETICS: Zofran, Compazine and dexamethasone  CURRENT SMOKING STATUS: Former smoker  ORAL CHEMOTHERAPY AND CONSENT: None  CURRENT BISPHOSPHONATES USE: None  PAIN MANAGEMENT: 0/10  NARCOTICS INDUCED CONSTIPATION: None  LIVING WILL AND CODE STATUS: ?   INTERVAL HISTORY: Anthony Curry 75 y.o. male returns to the clinic today for followup visit accompanied by his wife. The patient has no complaints today except for persistent dizzy spells that has been going on for a while. He was evaluated in the past by neurology. He is planning to arrange for another appointment for evaluation. He tolerated the last cycle of his treatment fairly well with no significant adverse effects. He has no fever or chills. He denied having any significant chest pain, shortness of breath, cough or hemoptysis. He  denied having any nausea or vomiting. No significant weight loss or night sweats. He has no skin rash or diarrhea. He is here today for evaluation before starting cycle #32.  MEDICAL HISTORY: Past Medical History  Diagnosis Date  . Hypertension   . GERD (gastroesophageal reflux disease)   . 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 Persistent atrial fibrillation ([place])   . 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 BPH (benign prostatic hyperplasia)   . Anemia of chronic disease   . Pulmonary nodules   . Adrenal mass ([place])   . 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 Alcoholism in remission ([place])   . Radiation 12/02/14-12/16/14    Left adrenal gland 30 Gy in 10 fractions  . Complication of anesthesia 09/08/2015     JITTERY AFTER GALLBLADDER SURGERY  . Anxiety   . Cancer ([place])     small cell/lung  . Neoplastic malignant related fatigue     IV tx. every 2 weeks last9-14-16 (Dr. Earlie Server), radiation last tx. 6 months    ALLERGIES:  has No Known Allergies.  MEDICATIONS:  Current Outpatient Prescriptions  Medication Sig Dispense Refill  . ALPRAZolam (XANAX) 0.25 MG tablet Take 0.25 mg by mouth 3 (three) times daily as needed for anxiety or sleep. And also at night for sleep.    [name] apixaban (ELIQUIS) 5 MG TABS tablet Take 5 mg by mouth 2 (two) times daily.    . Ascorbic Acid (VITAMIN C PO) Take 1,000 mg by mouth daily.     . B Complex-C (SUPER B COMPLEX PO) Take 1 each by mouth daily.     . calcium carbonate (OS-CAL) 600  MG TABS Take 600 mg by mouth daily with breakfast.    . demeclocycline (DECLOMYCIN) 150 MG tablet Take 150 mg by mouth 2 (two) times daily.    [name] diltiazem (CARDIZEM CD) 180 MG 24 hr capsule Take 1 capsule (180 mg total) by mouth daily at 12 noon. 90 capsule 3  . furosemide (LASIX) 40 MG tablet Take 0.5 tablets (20 mg total) by mouth daily. (Patient taking differently: Take 20 mg by mouth every other day. ) 90 tablet 3  . HYDROcodone-acetaminophen (NORCO/VICODIN) 5-325  MG tablet Take 1-2 tablets by mouth every 4 (four) hours as needed for moderate pain. 30 tablet 0  . KLOR-CON M20 20 MEQ tablet Take 20 mEq by mouth every morning.     . Lactobacillus (ACIDOPHILUS) CAPS capsule Take 1 capsule by mouth daily.    [name] MAGNESIUM PO Take 400 mg by mouth daily.     . mirtazapine (REMERON) 15 MG tablet Take 1 tablet (15 mg total) by mouth at bedtime. (Patient taking differently: Take 15 mg by mouth at bedtime as needed (sleep). ) 30 tablet 0  . Multiple Vitamin (MULTIVITAMIN WITH MINERALS) TABS Take 1 tablet by mouth daily.    . Nivolumab (OPDIVO IV) Inject as directed every 2 weeks    . Nutritional Supplements (ENSURE COMPLETE PO) Take 1 Can by mouth 2 (two) times daily.    . Omega-3 Fatty Acids (FISH OIL PO) Take 1 tablet by mouth daily.    . pantoprazole (PROTONIX) 40 MG tablet Take 40 mg by mouth every morning.  8  . tamsulosin (FLOMAX) 0.4 MG CAPS capsule Take 0.4 mg by mouth daily at 12 noon.     . thiamine (VITAMIN B-1) 100 MG tablet Take 1 tablet (100 mg total) by mouth daily. 30 tablet 6  . vitamin E 400 UNIT capsule Take 400 Units by mouth daily.     No current facility-administered medications for this visit.    SURGICAL HISTORY:  Past Surgical History  Procedure Laterality Date  . Hemorrhoid surgery    . Ankle arthrodesis  1995    rt fx-hardware in  . Tonsillectomy    . Colonoscopy    . Excision / curettage bone cyst finger  2005    rt thumb  . Total shoulder arthroplasty  02/03/2012    Procedure: TOTAL SHOULDER ARTHROPLASTY;  Surgeon: Eulas Post, MD;  Location: [facility];  Service: Orthopedics;  Laterality: Left;  . Cardioversion N/A 09/29/2014    Procedure: CARDIOVERSION;  Surgeon: Wendall Stade, MD;  Location[facility] ENDOSCOPY;  Service: Cardiovascular;  Laterality: N/A;  . Percutaneous cholecytostomy tube      IVR insertion 06-13-15(Dr. Deanne Coffer)- 08-28-15 remains in place to drainage bag  . Ercp N/A 09/02/2015    Procedure:  ENDOSCOPIC RETROGRADE CHOLANGIOPANCREATOGRAPHY (ERCP);  Surgeon: Willis Modena, MD;  Location: [facility] ENDOSCOPY;  Service: Endoscopy;  Laterality: N/A;  . Ercp N/A 09/07/2015    Procedure: ENDOSCOPIC RETROGRADE CHOLANGIOPANCREATOGRAPHY (ERCP);  Surgeon: Vida Rigger, MD;  Location: [facility] ENDOSCOPY;  Service: Endoscopy;  Laterality: N/A;  . Cholecystectomy  09/08/2015     laproscopic   . Cholecystectomy N/A 09/08/2015    Procedure: LAPAROSCOPIC CHOLECYSTECTOMY WITH ATTEMPTED INTRAOPERATIVE CHOLANGIOGRAM ;  Surgeon: Claud Kelp, MD;  Location: [facility] OR;  Service: General;  Laterality: N/A;    REVIEW OF SYSTEMS:  A comprehensive review of systems was negative except for: Constitutional: positive for fatigue Neurological: positive for dizziness   PHYSICAL EXAMINATION: General appearance: alert, cooperative, fatigued and  no distress Head: Normocephalic, without obvious abnormality, atraumatic Neck: no adenopathy, no JVD, supple, symmetrical, trachea midline and thyroid not enlarged, symmetric, no tenderness/mass/nodules Lymph nodes: Cervical, supraclavicular, and axillary nodes normal. Resp: clear to auscultation bilaterally Back: symmetric, no curvature. ROM normal. No CVA tenderness. Cardio: regular rate and rhythm, S1, S2 normal, no murmur, click, rub or gallop GI: Right upper quadrant tenderness to palpation Extremities: extremities normal, atraumatic, no cyanosis or edema Neurologic: Alert and oriented X 3, normal strength and tone. Normal symmetric reflexes. Normal coordination and gait  ECOG PERFORMANCE STATUS: 1 - Symptomatic but completely ambulatory  Blood pressure 137/67, pulse 82, temperature 98.1 F (36.7 C), temperature source Oral, resp. rate 18, height '5\' 11"'$  (1.803 m), weight 157 lb 1.6 oz (71.26 kg), SpO2 99 %.  LABORATORY DATA: Lab Results  Component Value Date   WBC 5.2 06/22/2016   HGB 14.6 06/22/2016   HCT 42.5 06/22/2016   MCV 94.9 06/22/2016   PLT 218 06/22/2016       Chemistry      Component Value Date/Time   NA 133* 06/08/2016 1104   NA 129* 01/07/2016 1844   K 4.1 06/08/2016 1104   K 4.0 01/07/2016 1844   CL 94* 01/07/2016 1844   CO2 23 06/08/2016 1104   CO2 22 01/07/2016 1844   BUN 15.1 06/08/2016 1104   BUN 16 01/07/2016 1844   CREATININE 0.9 06/08/2016 1104   CREATININE 0.85 01/07/2016 1844      Component Value Date/Time   CALCIUM 9.6 06/08/2016 1104   CALCIUM 8.7* 01/07/2016 1844   ALKPHOS 95 06/08/2016 1104   ALKPHOS 115 01/07/2016 1844   AST 25 06/08/2016 1104   AST 38 01/07/2016 1844   ALT 20 06/08/2016 1104   ALT 22 01/07/2016 1844   BILITOT 0.62 06/08/2016 1104   BILITOT 1.2 01/07/2016 1844       RADIOGRAPHIC STUDIES: Ct Chest W Contrast  06/06/2016  CLINICAL DATA:  Lung cancer diagnosed in 2014. Chemotherapy and radiation therapy completed. Opdivo therapy therapy in progress. EXAM: CT CHEST, ABDOMEN, AND PELVIS WITH CONTRAST TECHNIQUE: Multidetector CT imaging of the chest, abdomen and pelvis was performed following the standard protocol during bolus administration of intravenous contrast. CONTRAST:  140m ISOVUE-300 IOPAMIDOL (ISOVUE-300) INJECTION 61% COMPARISON:  CT 03/29/2016 and 01/11/2016 FINDINGS: CT CHEST Cardiovascular: There is diffuse atherosclerosis of the aorta, great vessels and coronary arteries. No acute vascular findings are evident. The heart size is normal. There is a small amount of pericardial fluid anteriorly which appears unchanged. Mediastinum/Nodes: There are no enlarged mediastinal, hilar or axillary lymph nodes. There is a new subglottic 12 mm right tracheal nodule on image 23 of series 4 which may is likely an adherent secretion. The esophagus appears unremarkable. Lungs/Pleura: There is no pleural effusion. There is generally stable chronic lung disease with emphysema, linear scarring at both lung bases and multiple ground-glass/sub solid nodular densities bilaterally. The largest ground-glass densities  measure 2.9 x 1.8 cm in the right upper lobe on image 67 and 2.8 x 2.5 cm in the left upper lobe on image 70. 3 mm right lower lobe solid nodule on image 141 and 3 mm left upper lobe nodule on image 49 are unchanged. There is an enlarging nodular component along the inferior aspect of the otherwise stable linear scarring in the left upper lobe. This measures up to 11 x 7 mm transverse on image 38 of series 4. No other significant changes are seen. Musculoskeletal/Chest wall: No suspicious osseous findings.  Posttraumatic or postsurgical deformities of the right ribs are again noted. There are chronic degenerative changes at the right shoulder and asymmetric gynecomastia on the right. CT ABDOMEN AND PELVIS FINDINGS Hepatobiliary: The liver appears unchanged. There is a hypervascular lesion anteriorly in the right lobe measuring 7 mm on image 62. Low-density lesions on images 62, 76 and 80 are unchanged. Stable pneumobilia status post cholecystectomy. Pancreas: Unremarkable. No pancreatic ductal dilatation or surrounding inflammatory changes. Spleen: Normal in size without focal abnormality. Adrenals/Urinary Tract: The adrenal glands appear unchanged. The kidneys appear stable without suspicious findings. There is stable renal cortical scarring bilaterally. No evidence of hydronephrosis or urinary tract calculus. The bladder is distended and trabeculated with several small diverticula. Stomach/Bowel: No evidence of bowel wall thickening, distention or surrounding inflammatory change. The cecum is in the right mid abdomen. Scattered diverticula are noted. Vascular/Lymphatic: There are no enlarged abdominal or pelvic lymph nodes. Diffuse aortic and branch vessel atherosclerosis. Reproductive: The prostate gland appears stable. A 7 mm right periurethral calcification on image 127 is unchanged from several prior studies. Other: No ascites or peritoneal nodularity. Musculoskeletal: No acute or significant osseous findings.  There are extensive degenerative changes throughout the thoracolumbar spine with endplate osteophytes and subchondral cyst formation. IMPRESSION: 1. There is increased nodularity along the inferior aspect of the otherwise stable linear scarring in the left upper lobe. As this is the site of the patient's original malignancy, local recurrence is a consideration. Suggest either continued short-term CT follow-up or consideration of PET-CT. 2. The lungs are otherwise unchanged with multifocal scarring and ground-glass densities. There are no other enlarging solid components. 3. No evidence of metastatic disease within the abdomen or pelvis. There are stable hepatic lesions. 4. Stable additional incidental findings including atherosclerosis, right-sided gynecomastia and bladder trabeculation. Electronically Signed   By: Carey Bullocks M.D.   On: 06/06/2016 11:09   Ct Abdomen Pelvis W Contrast  06/06/2016  CLINICAL DATA:  Lung cancer diagnosed in 2014. Chemotherapy and radiation therapy completed. Opdivo therapy therapy in progress. EXAM: CT CHEST, ABDOMEN, AND PELVIS WITH CONTRAST TECHNIQUE: Multidetector CT imaging of the chest, abdomen and pelvis was performed following the standard protocol during bolus administration of intravenous contrast. CONTRAST:  ISOVUE-300 IOPAMIDOL (ISOVUE-300) INJECTION 61% COMPARISON:  CT 03/29/2016 and 01/11/2016 FINDINGS: CT CHEST Cardiovascular: There is diffuse atherosclerosis of the aorta, great vessels and coronary arteries. No acute vascular findings are evident. The heart size is normal. There is a small amount of pericardial fluid anteriorly which appears unchanged. Mediastinum/Nodes: There are no enlarged mediastinal, hilar or axillary lymph nodes. There is a new subglottic 12 mm right tracheal nodule on image 23 of series 4 which may is likely an adherent secretion. The esophagus appears unremarkable. Lungs/Pleura: There is no pleural effusion. There is generally stable  chronic lung disease with emphysema, linear scarring at both lung bases and multiple ground-glass/sub solid nodular densities bilaterally. The largest ground-glass densities measure 2.9 x 1.8 cm in the right upper lobe on image 67 and 2.8 x 2.5 cm in the left upper lobe on image 70. 3 mm right lower lobe solid nodule on image 141 and 3 mm left upper lobe nodule on image 49 are unchanged. There is an enlarging nodular component along the inferior aspect of the otherwise stable linear scarring in the left upper lobe. This measures up to 11 x 7 mm transverse on image 38 of series 4. No other significant changes are seen. Musculoskeletal/Chest wall: No suspicious osseous findings. Posttraumatic  or postsurgical deformities of the right ribs are again noted. There are chronic degenerative changes at the right shoulder and asymmetric gynecomastia on the right. CT ABDOMEN AND PELVIS FINDINGS Hepatobiliary: The liver appears unchanged. There is a hypervascular lesion anteriorly in the right lobe measuring 7 mm on image 62. Low-density lesions on images 62, 76 and 80 are unchanged. Stable pneumobilia status post cholecystectomy. Pancreas: Unremarkable. No pancreatic ductal dilatation or surrounding inflammatory changes. Spleen: Normal in size without focal abnormality. Adrenals/Urinary Tract: The adrenal glands appear unchanged. The kidneys appear stable without suspicious findings. There is stable renal cortical scarring bilaterally. No evidence of hydronephrosis or urinary tract calculus. The bladder is distended and trabeculated with several small diverticula. Stomach/Bowel: No evidence of bowel wall thickening, distention or surrounding inflammatory change. The cecum is in the right mid abdomen. Scattered diverticula are noted. Vascular/Lymphatic: There are no enlarged abdominal or pelvic lymph nodes. Diffuse aortic and branch vessel atherosclerosis. Reproductive: The prostate gland appears stable. A 7 mm right  periurethral calcification on image 127 is unchanged from several prior studies. Other: No ascites or peritoneal nodularity. Musculoskeletal: No acute or significant osseous findings. There are extensive degenerative changes throughout the thoracolumbar spine with endplate osteophytes and subchondral cyst formation. IMPRESSION: 1. There is increased nodularity along the inferior aspect of the otherwise stable linear scarring in the left upper lobe. As this is the site of the patient's original malignancy, local recurrence is a consideration. Suggest either continued short-term CT follow-up or consideration of PET-CT. 2. The lungs are otherwise unchanged with multifocal scarring and ground-glass densities. There are no other enlarging solid components. 3. No evidence of metastatic disease within the abdomen or pelvis. There are stable hepatic lesions. 4. Stable additional incidental findings including atherosclerosis, right-sided gynecomastia and bladder trabeculation. Electronically Signed   By: Richardean Sale M.D.   On: 06/06/2016 11:09   ASSESSMENT AND PLAN: This is a very pleasant 75 years old white male with:  1)  Stage IV non-small cell lung cancer, adenocarcinoma status post induction chemotherapy with carboplatin and Alimta and currently on maintenance treatment with single agent Alimta status post 8 cycles. He has been observation for the last few months.  He had evidence for disease progression on the left adrenal gland and the patient underwent palliative radiotherapy to the left adrenal gland under the care of Dr. Sondra Come completed on 12/16/2014. He is currently undergoing treatment with immunotherapy with Nivolumab status post 30 cycles and tolerating his treatment fairly well.  I recommended for him to continue his current treatment with immunotherapy as a scheduled with cycle #32 today. The patient would come back for follow-up visit in 2 weeks with the start of cycle #33.  2) Atrial  fibrillation: He'll continue his current medication with Cardizem, digoxin and metoprolol in addition to Eliquis as prescribed by his cardiologist Dr. [name].  3) dizziness: Unclear etiology. I offered the patient to order MRI of the brain for evaluation of his condition but he would like to see the neurologist first. He was advised to call immediately if he has any concerning symptoms in the interval. The patient voices understanding of current disease status and treatment options and is in agreement with the current care plan.  All questions were answered. The patient knows to call the clinic with any problems, questions or concerns. We can certainly see the patient much sooner if necessary.  Disclaimer: This note was dictated with voice recognition software. Similar sounding words can inadvertently be transcribed and may  not be corrected upon review.

## 2016-06-22 NOTE — Progress Notes (Signed)
Sodium 131, Dr. [name] aware, okay to proceed with treatment per Dr. [name], no new orders at this time.

## 2016-07-06 ENCOUNTER — Telehealth: Payer: Self-pay | Admitting: Internal Medicine

## 2016-07-06 ENCOUNTER — Ambulatory Visit (HOSPITAL_BASED_OUTPATIENT_CLINIC_OR_DEPARTMENT_OTHER): Payer: Medicare Other | Admitting: Internal Medicine

## 2016-07-06 ENCOUNTER — Ambulatory Visit (HOSPITAL_BASED_OUTPATIENT_CLINIC_OR_DEPARTMENT_OTHER): Payer: Medicare Other

## 2016-07-06 ENCOUNTER — Other Ambulatory Visit (HOSPITAL_BASED_OUTPATIENT_CLINIC_OR_DEPARTMENT_OTHER): Payer: Medicare Other

## 2016-07-06 ENCOUNTER — Other Ambulatory Visit: Payer: Self-pay | Admitting: Internal Medicine

## 2016-07-06 ENCOUNTER — Ambulatory Visit
Admission: RE | Admit: 2016-07-06 | Discharge: 2016-07-06 | Disposition: A | Discharge status: Home / Self Care | Payer: Medicare Other | Source: Ambulatory Visit | Attending: Internal Medicine | Admitting: Internal Medicine

## 2016-07-06 VITALS — BP 132/62 | HR 66 | Temp 97.9°F | Resp 18 | Ht 71.0 in | Wt 157.8 lb

## 2016-07-06 DIAGNOSIS — C3431 Malignant neoplasm of lower lobe, right bronchus or lung: Secondary | ICD-10-CM

## 2016-07-06 DIAGNOSIS — R2689 Other abnormalities of gait and mobility: Secondary | ICD-10-CM | POA: Diagnosis not present

## 2016-07-06 DIAGNOSIS — I4891 Unspecified atrial fibrillation: Secondary | ICD-10-CM

## 2016-07-06 DIAGNOSIS — R42 Dizziness and giddiness: Secondary | ICD-10-CM

## 2016-07-06 DIAGNOSIS — C7971 Secondary malignant neoplasm of right adrenal gland: Secondary | ICD-10-CM

## 2016-07-06 DIAGNOSIS — Z5112 Encounter for antineoplastic immunotherapy: Secondary | ICD-10-CM

## 2016-07-06 DIAGNOSIS — C3491 Malignant neoplasm of unspecified part of right bronchus or lung: Secondary | ICD-10-CM

## 2016-07-06 LAB — COMPREHENSIVE METABOLIC PANEL
ALBUMIN: 3.7 g/dL (ref 3.5–5.0)
ALT: 16 U/L (ref 0–55)
ANION GAP: 8 meq/L (ref 3–11)
AST: 20 U/L (ref 5–34)
Alkaline Phosphatase: 85 U/L (ref 40–150)
BILIRUBIN TOTAL: 0.76 mg/dL (ref 0.20–1.20)
BUN: 19.6 mg/dL (ref 7.0–26.0)
CALCIUM: 9.2 mg/dL (ref 8.4–10.4)
CHLORIDE: 100 meq/L (ref 98–109)
CO2: 24 meq/L (ref 22–29)
Creatinine: 0.9 mg/dL (ref 0.7–1.3)
EGFR: 83 mL/min/{1.73_m2} — AB (ref >90)
GLUCOSE: 119 mg/dL (ref 70–140)
Potassium: 4.5 mEq/L (ref 3.5–5.1)
Sodium: 132 mEq/L — ABNORMAL LOW (ref 136–145)
TOTAL PROTEIN: 6.1 g/dL — AB (ref 6.4–8.3)

## 2016-07-06 LAB — CBC WITH DIFFERENTIAL/PLATELET
BASO%: 0.6 % (ref 0.0–2.0)
BASOS ABS: 0 10*3/uL (ref 0.0–0.1)
EOS ABS: 0.3 10*3/uL (ref 0.0–0.5)
EOS%: 6.8 % (ref 0.0–7.0)
HEMATOCRIT: 38.7 % (ref 38.4–49.9)
HEMOGLOBIN: 13.8 g/dL (ref 13.0–17.1)
LYMPH%: 29.6 % (ref 14.0–49.0)
MCH: 32.9 pg (ref 27.2–33.4)
MCHC: 35.7 g/dL (ref 32.0–36.0)
MCV: 92.1 fL (ref 79.3–98.0)
MONO#: 0.6 10*3/uL (ref 0.1–0.9)
MONO%: 12.6 % (ref 0.0–14.0)
NEUT#: 2.4 10*3/uL (ref 1.5–6.5)
NEUT%: 50.4 % (ref 39.0–75.0)
PLATELETS: 206 10*3/uL (ref 140–400)
RBC: 4.2 10*6/uL (ref 4.20–5.82)
RDW: 14.2 % (ref 11.0–14.6)
WBC: 4.7 10*3/uL (ref 4.0–10.3)
lymph#: 1.4 10*3/uL (ref 0.9–3.3)

## 2016-07-06 LAB — LACTATE DEHYDROGENASE: LDH: 137 U/L (ref 125–245)

## 2016-07-06 MED ORDER — SODIUM CHLORIDE 0.9 % IV SOLN
240.0000 mg | Freq: Once | INTRAVENOUS | Status: AC
  Administered 2016-07-06: 240 mg via INTRAVENOUS
  Filled 2016-07-06: qty 20

## 2016-07-06 MED ORDER — SODIUM CHLORIDE 0.9 % IV SOLN
Freq: Once | INTRAVENOUS | Status: AC
  Administered 2016-07-06: 12:00:00 via INTRAVENOUS

## 2016-07-06 MED ORDER — GADOBENATE DIMEGLUMINE 529 MG/ML IV SOLN
14.0000 mL | Freq: Once | INTRAVENOUS | Status: AC | PRN
  Administered 2016-07-06: 14 mL via INTRAVENOUS

## 2016-07-06 NOTE — Progress Notes (Signed)
[facility] [phone]   Fax:([phone]) [place], [place] [address]  DIAGNOSIS: Stage IV (T1a, N0, M1b) non-small cell lung cancer of the right lower lobe, adenocarcinoma with negative EGFR mutation and negative ALK gene translocation diagnosed in October of 2014.   Molecular profile: Negative for RET, ALK, BRAF, MET, EGFR.  Positive for ERBB2 A775T, LPF7T024O, KRAS G13E, MAP2K1 D67N (see full report).   PRIOR THERAPY:  1) Systemic chemotherapy with carboplatin for AUC of 5 and Alimta 500 mg/M2 every 3 weeks, status post 6 cycles with stable disease. First dose on 10/23/2013. 2) Maintenance systemic chemotherapy with single agent Alimta 500 mg/M2 every 3 weeks. Status post 8 cycles.   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 Sondra Come completed on 12/16/2014.  CURRENT THERAPY: Immunotherapy with Nivolumab 3 MG/KG every 2 weeks, status post 33 cycles. First cycle 01/28/2015  CHEMOTHERAPY INTENT: Palliative  CURRENT # OF CHEMOTHERAPY CYCLES: 34 CURRENT ANTIEMETICS: Zofran, Compazine and dexamethasone  CURRENT SMOKING STATUS: Former smoker  ORAL CHEMOTHERAPY AND CONSENT: None  CURRENT BISPHOSPHONATES USE: None  PAIN MANAGEMENT: 0/10  NARCOTICS INDUCED CONSTIPATION: None  LIVING WILL AND CODE STATUS: ?   INTERVAL HISTORY: Anthony Curry 75 y.o. male returns to the clinic today for followup visit accompanied by his wife. The patient has no complaints today except for worsening dizzy spells. He was evaluated in the past by neurology. He is planning to arrange for another appointment for evaluation. He is also planning to call his cardiologist for evaluation since the dizziness Worse after starting treatment for the atrial fibrillation with Eliquis. He tolerated the last cycle of his treatment fairly well with no significant adverse effects.  He has no fever or chills. He denied having any significant chest pain, shortness of breath, cough or hemoptysis. He denied having any nausea or vomiting. No significant weight loss or night sweats. He has no skin rash or diarrhea. He is here today for evaluation before starting cycle #34.  MEDICAL HISTORY: Past Medical History:  Diagnosis Date  . 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[place])   . Adrenal mass ([place])   . Alcoholism in remission ([place])   . Anemia of chronic disease   . 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 Anxiety   . BPH (benign prostatic hyperplasia)   . Cancer ([place])    small cell/lung  . Complication of anesthesia 09/08/2015    JITTERY AFTER GALLBLADDER SURGERY  . GERD (gastroesophageal reflux disease)   . Hypertension   . Neoplastic malignant related fatigue    IV tx. every 2 weeks last9-14-16 (Dr. Earlie Server), radiation last tx. 6 months  . 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 Persistent atrial fibrillation ([place])   . Pulmonary nodules   . Radiation 12/02/14-12/16/14   Left adrenal gland 30 Gy in 10 fractions    ALLERGIES:  has No Known Allergies.  MEDICATIONS:  Current Outpatient Prescriptions  Medication Sig Dispense Refill  . ALPRAZolam (XANAX) 0.25 MG tablet Take 0.25 mg by mouth 3 (three) times daily as needed for anxiety or sleep. And also at night for sleep.    [name] apixaban (ELIQUIS) 5 MG TABS tablet Take 5 mg by mouth 2 (two) times daily.    . Ascorbic Acid (VITAMIN C PO) Take 1,000 mg by mouth daily.     . B Complex-C (SUPER B COMPLEX PO) Take 1 each by  mouth daily.     . calcium carbonate (OS-CAL) 600 MG TABS Take 600 mg by mouth daily with breakfast.    . demeclocycline (DECLOMYCIN) 150 MG tablet Take 150 mg by mouth 2 (two) times daily.    [name] diltiazem (CARDIZEM CD) 180 MG 24 hr capsule Take 1 capsule (180 mg total) by mouth daily at 12 noon. 90 capsule 3  . furosemide (LASIX) 40 MG tablet Take 0.5 tablets (20 mg total) by mouth daily. (Patient taking  differently: Take 20 mg by mouth every other day. ) 90 tablet 3  . HYDROcodone-acetaminophen (NORCO/VICODIN) 5-325 MG tablet Take 1-2 tablets by mouth every 4 (four) hours as needed for moderate pain. 30 tablet 0  . KLOR-CON M20 20 MEQ tablet Take 20 mEq by mouth every morning.     . Lactobacillus (ACIDOPHILUS) CAPS capsule Take 1 capsule by mouth daily.    [name] MAGNESIUM PO Take 400 mg by mouth daily.     . mirtazapine (REMERON) 15 MG tablet Take 1 tablet (15 mg total) by mouth at bedtime. (Patient taking differently: Take 15 mg by mouth at bedtime as needed (sleep). ) 30 tablet 0  . Multiple Vitamin (MULTIVITAMIN WITH MINERALS) TABS Take 1 tablet by mouth daily.    . Nivolumab (OPDIVO IV) Inject as directed every 2 weeks    . Nutritional Supplements (ENSURE COMPLETE PO) Take 1 Can by mouth 2 (two) times daily.    . Omega-3 Fatty Acids (FISH OIL PO) Take 1 tablet by mouth daily.    . pantoprazole (PROTONIX) 40 MG tablet Take 40 mg by mouth every morning.  8  . tamsulosin (FLOMAX) 0.4 MG CAPS capsule Take 0.4 mg by mouth daily at 12 noon.     . thiamine (VITAMIN B-1) 100 MG tablet Take 1 tablet (100 mg total) by mouth daily. 30 tablet 6  . vitamin E 400 UNIT capsule Take 400 Units by mouth daily.     No current facility-administered medications for this visit.     SURGICAL HISTORY:  Past Surgical History:  Procedure Laterality Date  . ANKLE ARTHRODESIS  1995   rt fx-hardware in  . CARDIOVERSION N/A 09/29/2014   Procedure: CARDIOVERSION;  Surgeon: Josue Hector, MD;  Location: [facility] ENDOSCOPY;  Service: Cardiovascular;  Laterality: N/A;  . CHOLECYSTECTOMY  09/08/2015    laproscopic   . CHOLECYSTECTOMY N/A 09/08/2015   Procedure: LAPAROSCOPIC CHOLECYSTECTOMY WITH ATTEMPTED INTRAOPERATIVE CHOLANGIOGRAM ;  Surgeon: Fanny Skates, MD;  Location: [facility];  Service: General;  Laterality: N/A;  . COLONOSCOPY    . ERCP N/A 09/02/2015   Procedure: ENDOSCOPIC RETROGRADE CHOLANGIOPANCREATOGRAPHY (ERCP);   Surgeon: Arta Silence, MD;  Location: [facility] ENDOSCOPY;  Service: Endoscopy;  Laterality: N/A;  . ERCP N/A 09/07/2015   Procedure: ENDOSCOPIC RETROGRADE CHOLANGIOPANCREATOGRAPHY (ERCP);  Surgeon: Clarene Essex, MD;  Location: [facility] ENDOSCOPY;  Service: Endoscopy;  Laterality: N/A;  . EXCISION / CURETTAGE BONE CYST FINGER  2005   rt thumb  . HEMORRHOID SURGERY    . Percutaneous Cholecytostomy tube     IVR insertion 06-13-15(Dr. Vernard Gambles)- 08-28-15 remains in place to drainage bag  . TONSILLECTOMY    . TOTAL SHOULDER ARTHROPLASTY  02/03/2012   Procedure: TOTAL SHOULDER ARTHROPLASTY;  Surgeon: [name], MD;  Location: [facility];  Service: Orthopedics;  Laterality: Left;    REVIEW OF SYSTEMS:  A comprehensive review of systems was negative except for: Constitutional: positive for fatigue Neurological: positive for dizziness   PHYSICAL EXAMINATION: General appearance: alert,  cooperative, fatigued and no distress Head: Normocephalic, without obvious abnormality, atraumatic Neck: no adenopathy, no JVD, supple, symmetrical, trachea midline and thyroid not enlarged, symmetric, no tenderness/mass/nodules Lymph nodes: Cervical, supraclavicular, and axillary nodes normal. Resp: clear to auscultation bilaterally Back: symmetric, no curvature. ROM normal. No CVA tenderness. Cardio: regular rate and rhythm, S1, S2 normal, no murmur, click, rub or gallop GI: Right upper quadrant tenderness to palpation Extremities: extremities normal, atraumatic, no cyanosis or edema Neurologic: Alert and oriented X 3, normal strength and tone. Normal symmetric reflexes. Normal coordination and gait  ECOG PERFORMANCE STATUS: 1 - Symptomatic but completely ambulatory  Blood pressure 132/62, pulse 66, temperature 97.9 F (36.6 C), temperature source Oral, resp. rate 18, height 5' 11"  (1.803 m), weight 157 lb 12.8 oz (71.6 kg), SpO2 99 %.  LABORATORY DATA: Lab Results  Component Value Date   WBC 4.7  07/06/2016   HGB 13.8 07/06/2016   HCT 38.7 07/06/2016   MCV 92.1 07/06/2016   PLT 206 07/06/2016      Chemistry      Component Value Date/Time   NA 132 (L) 07/06/2016 0950   K 4.5 07/06/2016 0950   CL 94 (L) 01/07/2016 1844   CO2 24 07/06/2016 0950   BUN 19.6 07/06/2016 0950   CREATININE 0.9 07/06/2016 0950      Component Value Date/Time   CALCIUM 9.2 07/06/2016 0950   ALKPHOS 85 07/06/2016 0950   AST 20 07/06/2016 0950   ALT 16 07/06/2016 0950   BILITOT 0.76 07/06/2016 0950       RADIOGRAPHIC STUDIES: No results found. ASSESSMENT AND PLAN: This is a very pleasant 75 years old white male with:  1)  Stage IV non-small cell lung cancer, adenocarcinoma status post induction chemotherapy with carboplatin and Alimta and currently on maintenance treatment with single agent Alimta status post 8 cycles. He has been observation for the last few months.  He had evidence for disease progression on the left adrenal gland and the patient underwent palliative radiotherapy to the left adrenal gland under the care of Dr. Sondra Come completed on 12/16/2014. He is currently undergoing treatment with immunotherapy with Nivolumab status post 33 cycles and tolerating his treatment fairly well.  I recommended for him to continue his current treatment with immunotherapy as a scheduled with cycle #34 today. The patient would come back for follow-up visit in 2 weeks with the start of cycle #34.  2) Atrial fibrillation: He'll continue his current medication with Cardizem, digoxin and metoprolol in addition to Eliquis as prescribed by his cardiologist Dr. [name].  3) dizziness: Unclear etiology. I will order MRI of the brain for evaluation of his condition and he agreed to this option.   He was advised to call immediately if he has any concerning symptoms in the interval. The patient voices understanding of current disease status and treatment options and is in agreement with the current care  plan.  All questions were answered. The patient knows to call the clinic with any problems, questions or concerns. We can certainly see the patient much sooner if necessary.  Disclaimer: This note was dictated with voice recognition software. Similar sounding words can inadvertently be transcribed and may not be corrected upon review.

## 2016-07-06 NOTE — Patient Instructions (Signed)
[facility] Discharge Instructions for Patients Receiving Chemotherapy  Today you received the following chemotherapy agents:  Nivolumab.  To help prevent nausea and vomiting after your treatment, we encourage you to take your nausea medication as directed.   If you develop nausea and vomiting that is not controlled by your nausea medication, call the clinic.   BELOW ARE SYMPTOMS THAT SHOULD BE REPORTED IMMEDIATELY:  *FEVER GREATER THAN 100.5 F  *CHILLS WITH OR WITHOUT FEVER  NAUSEA AND VOMITING THAT IS NOT CONTROLLED WITH YOUR NAUSEA MEDICATION  *UNUSUAL SHORTNESS OF BREATH  *UNUSUAL BRUISING OR BLEEDING  TENDERNESS IN MOUTH AND THROAT WITH OR WITHOUT PRESENCE OF ULCERS  *URINARY PROBLEMS  *BOWEL PROBLEMS  UNUSUAL RASH Items with * indicate a potential emergency and should be followed up as soon as possible.  Feel free to call the clinic you have any questions or concerns. The clinic phone number is [phone].  Please show the CHEMO ALERT CARD at check-in to the Emergency Department and triage nurse.   

## 2016-07-06 NOTE — Telephone Encounter (Signed)
Apt added pt will get updated sched in tx room

## 2016-07-08 ENCOUNTER — Telehealth: Payer: Self-pay | Admitting: Medical Oncology

## 2016-07-08 ENCOUNTER — Other Ambulatory Visit: Payer: Medicare Other

## 2016-07-08 NOTE — Telephone Encounter (Signed)
Per Anthony Curry I gave pt MRI results. He appreciated the call back.

## 2016-07-08 NOTE — Telephone Encounter (Signed)
Wants results MRI

## 2016-07-14 ENCOUNTER — Other Ambulatory Visit: Payer: Self-pay

## 2016-07-14 ENCOUNTER — Encounter (HOSPITAL_COMMUNITY): Payer: Self-pay | Admitting: Nurse Practitioner

## 2016-07-14 ENCOUNTER — Ambulatory Visit (HOSPITAL_COMMUNITY)
Admission: RE | Admit: 2016-07-14 | Discharge: 2016-07-14 | Disposition: A | Discharge status: Home / Self Care | Payer: Medicare Other | Source: Ambulatory Visit | Attending: Nurse Practitioner | Admitting: Nurse Practitioner

## 2016-07-14 VITALS — BP 152/78 | HR 73 | Ht 71.0 in | Wt 156.0 lb

## 2016-07-14 DIAGNOSIS — R42 Dizziness and giddiness: Secondary | ICD-10-CM | POA: Insufficient documentation

## 2016-07-14 DIAGNOSIS — K219 Gastro-esophageal reflux disease without esophagitis: Secondary | ICD-10-CM | POA: Insufficient documentation

## 2016-07-14 DIAGNOSIS — Z801 Family history of malignant neoplasm of trachea, bronchus and lung: Secondary | ICD-10-CM | POA: Diagnosis not present

## 2016-07-14 DIAGNOSIS — Z85118 Personal history of other malignant neoplasm of bronchus and lung: Secondary | ICD-10-CM | POA: Diagnosis not present

## 2016-07-14 DIAGNOSIS — F419 Anxiety disorder, unspecified: Secondary | ICD-10-CM | POA: Diagnosis not present

## 2016-07-14 DIAGNOSIS — Z7901 Long term (current) use of anticoagulants: Secondary | ICD-10-CM | POA: Diagnosis not present

## 2016-07-14 DIAGNOSIS — Z79899 Other long term (current) drug therapy: Secondary | ICD-10-CM | POA: Insufficient documentation

## 2016-07-14 DIAGNOSIS — I119 Hypertensive heart disease without heart failure: Secondary | ICD-10-CM | POA: Diagnosis not present

## 2016-07-14 DIAGNOSIS — Z87891 Personal history of nicotine dependence: Secondary | ICD-10-CM | POA: Diagnosis not present

## 2016-07-14 DIAGNOSIS — I480 Paroxysmal atrial fibrillation: Secondary | ICD-10-CM | POA: Diagnosis not present

## 2016-07-14 DIAGNOSIS — Z8249 Family history of ischemic heart disease and other diseases of the circulatory system: Secondary | ICD-10-CM | POA: Insufficient documentation

## 2016-07-14 DIAGNOSIS — Z9221 Personal history of antineoplastic chemotherapy: Secondary | ICD-10-CM | POA: Insufficient documentation

## 2016-07-14 DIAGNOSIS — Z923 Personal history of irradiation: Secondary | ICD-10-CM | POA: Insufficient documentation

## 2016-07-14 DIAGNOSIS — R2681 Unsteadiness on feet: Secondary | ICD-10-CM | POA: Diagnosis not present

## 2016-07-14 DIAGNOSIS — N400 Enlarged prostate without lower urinary tract symptoms: Secondary | ICD-10-CM | POA: Insufficient documentation

## 2016-07-14 DIAGNOSIS — I4891 Unspecified atrial fibrillation: Secondary | ICD-10-CM | POA: Diagnosis present

## 2016-07-14 NOTE — Patient Instructions (Signed)
Take Xarelto '20mg'$  once a day at supper. Starting [employer] in about 5-7 days with results of changing.

## 2016-07-14 NOTE — Progress Notes (Signed)
Patient ID: Anthony Curry, male   DOB: [birth year], 75 y.o.   MRN: [id number]       PCP:  [name], MD  Electrophysiologist: Dr. [name]  The patient presents today for follow up in the afib clinic.  He was diagnosed with atrial fibrillation in May 2015 in the setting of treatment for his Stage IV lung CA. Had hospitalization 8/15 for AF with RVR. He had successful cardioversion 09/29/14. He continues with SR on diltiazem and metoprolol.   He  finished chemo/radiation for lung CA and was treated  for adrenal mass. He was hospitalized for gallstone removal/choescytectomy 10/16 and did well with this without any issues with bleeding or afib.  He is unaware of any Afib and ekg shows NSR.   8/10, he returns to afib clinic and is in [place]. He has not noted any irregular heart beat. However, his biggest c/o is that of dizziness which he states is 24/7. 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 He has gone from a can to a walker with wheels. He states that all this started about the time he started his blood thinner and would like to switch to another agent to see if there is any correlation. If no improvement, he will believe the neurologist and learn to live with the neuropathy.  Today, he denies symptoms of palpitations, chest pain, some  lower extremity edema, dizziness, presyncope, syncope, or neurologic sequela.  The patient feels that he is tolerating medications without difficulties and is otherwise without complaint today.   Past Medical History:  Diagnosis Date  . 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[place])   . Adrenal mass ([place])   . Alcoholism in remission ([place])   . Anemia of chronic disease   . 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 Anxiety   . BPH (benign prostatic hyperplasia)   . Cancer ([place])    small cell/lung  . Complication of anesthesia 09/08/2015    JITTERY AFTER GALLBLADDER SURGERY  . GERD (gastroesophageal reflux  disease)   . Hypertension   . Neoplastic malignant related fatigue    IV tx. every 2 weeks last9-14-16 (Dr. Earlie Server), radiation last tx. 6 months  . 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 Persistent atrial fibrillation ([place])   . Pulmonary nodules   . Radiation 12/02/14-12/16/14   Left adrenal gland 30 Gy in 10 fractions   Past Surgical History:  Procedure Laterality Date  . ANKLE ARTHRODESIS  1995   rt fx-hardware in  . CARDIOVERSION N/A 09/29/2014   Procedure: CARDIOVERSION;  Surgeon: Josue Hector, MD;  Location: [facility] ENDOSCOPY;  Service: Cardiovascular;  Laterality: N/A;  . CHOLECYSTECTOMY  09/08/2015    laproscopic   . CHOLECYSTECTOMY N/A 09/08/2015   Procedure: LAPAROSCOPIC CHOLECYSTECTOMY WITH ATTEMPTED INTRAOPERATIVE CHOLANGIOGRAM ;  Surgeon: Fanny Skates, MD;  Location: [facility];  Service: General;  Laterality: N/A;  . COLONOSCOPY    . ERCP N/A 09/02/2015   Procedure: ENDOSCOPIC RETROGRADE CHOLANGIOPANCREATOGRAPHY (ERCP);  Surgeon: Arta Silence, MD;  Location: [facility] ENDOSCOPY;  Service: Endoscopy;  Laterality: N/A;  . ERCP N/A 09/07/2015   Procedure: ENDOSCOPIC RETROGRADE CHOLANGIOPANCREATOGRAPHY (ERCP);  Surgeon: Clarene Essex, MD;  Location: [facility] ENDOSCOPY;  Service: Endoscopy;  Laterality: N/A;  . EXCISION / CURETTAGE BONE CYST FINGER  2005   rt thumb  . HEMORRHOID SURGERY    . Percutaneous Cholecytostomy tube     IVR insertion 06-13-15(Dr. Vernard Gambles)- 08-28-15 remains in place to drainage bag  . TONSILLECTOMY    .  TOTAL SHOULDER ARTHROPLASTY  02/03/2012   Procedure: TOTAL SHOULDER ARTHROPLASTY;  Surgeon: [name], MD;  Location: [facility];  Service: Orthopedics;  Laterality: Left;    Current Outpatient Prescriptions  Medication Sig Dispense Refill  . ALPRAZolam (XANAX) 0.25 MG tablet Take 0.25 mg by mouth 3 (three) times daily as needed for anxiety or sleep. And also at night for sleep.    [name] apixaban (ELIQUIS) 5 MG TABS tablet Take 5 mg by mouth 2 (two)  times daily.    . Ascorbic Acid (VITAMIN C PO) Take 1,000 mg by mouth daily.     . B Complex-C (SUPER B COMPLEX PO) Take 1 each by mouth daily.     . calcium carbonate (OS-CAL) 600 MG TABS Take 600 mg by mouth daily with breakfast.    . demeclocycline (DECLOMYCIN) 150 MG tablet Take 150 mg by mouth 2 (two) times daily.    [name] diltiazem (CARDIZEM CD) 180 MG 24 hr capsule Take 1 capsule (180 mg total) by mouth daily at 12 noon. 90 capsule 3  . furosemide (LASIX) 40 MG tablet Take 0.5 tablets (20 mg total) by mouth daily. (Patient taking differently: Take 20 mg by mouth every other day. ) 90 tablet 3  . HYDROcodone-acetaminophen (NORCO/VICODIN) 5-325 MG tablet Take 1-2 tablets by mouth every 4 (four) hours as needed for moderate pain. 30 tablet 0  . KLOR-CON M20 20 MEQ tablet Take 20 mEq by mouth every morning.     . Lactobacillus (ACIDOPHILUS) CAPS capsule Take 1 capsule by mouth daily.    [name] MAGNESIUM PO Take 400 mg by mouth daily.     . Multiple Vitamin (MULTIVITAMIN WITH MINERALS) TABS Take 1 tablet by mouth daily.    . Nivolumab (OPDIVO IV) Inject as directed every 2 weeks    . Nutritional Supplements (ENSURE COMPLETE PO) Take 1 Can by mouth 2 (two) times daily.    . Omega-3 Fatty Acids (FISH OIL PO) Take 1 tablet by mouth daily.    . pantoprazole (PROTONIX) 40 MG tablet Take 40 mg by mouth every morning.  8  . tamsulosin (FLOMAX) 0.4 MG CAPS capsule Take 0.4 mg by mouth daily at 12 noon.     . thiamine (VITAMIN B-1) 100 MG tablet Take 1 tablet (100 mg total) by mouth daily. 30 tablet 6  . vitamin E 400 UNIT capsule Take 400 Units by mouth daily.     No current facility-administered medications for this encounter.     No Known Allergies  Social History   Social History  . Marital status: Married    Spouse name: Shermon Bozzi  . Number of children: 3  . Years of education: 12   Occupational History  . 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 Smoking status: Former Smoker    Packs/day: 2.00    Years: 30.00    Types: Cigarettes    Quit date: 12/05/1990  . Smokeless tobacco: Never Used  . Alcohol use 0.0 oz/week     Comment: past hx.ETOH abuse,08-31-15 "occ. wine ,drinks non alcoholic beer occ."  . Drug use: No  . Sexual activity: Not Currently   Other Topics Concern  . Not on file   Social History Narrative   Lives at home with wife.   Caffeine use: Drinks coffee- 2 cups per day   Retired from [employer] History  Problem Relation Age of Onset  .  Brain cancer Brother   . Lung cancer Mother   . Hypertension Father   . Heart attack Father   . Diabetes Paternal Grandmother   . Heart attack Brother   . Neuropathy Neg Hx     ROS-  All systems are reviewed and are negative except as outlined in the HPI above  Physical Exam: Vitals:   07/14/16 1328  BP: (!) 152/78  Pulse: 73  Weight: 156 lb (70.8 kg)  Height: '5\' 11"'$  (1.803 m)    GEN- The patient is ill appearing, alert and oriented x 3 today.   Head- normocephalic, atraumatic Eyes-  Sclera clear, conjunctiva pink Ears- hearing intact Oropharynx- clear Neck- supple,   Lungs- decreased BS at the bases, normal work of breathing Heart- RRR GI- soft, NT, ND, + BS Extremities- no clubbing, cyanosis, 1t edemal rt, trace left MS- no significant deformity or atrophy Skin- no rash or lesion Psych- euthymic mood, full affect Neuro- strength and sensation are intact  ekg today reveals SR , Pr int 208 ms, qrs int 74 ms, qrs int 418 ms Epic records reviewed.   Assessment and Plan:  1. PAF   Staying in SR Continue diltazem Continue Doac  2. Stage IV lung CA Stable, per oncology  3. Adrenal Mass Treated  4. Hypertensive cardiovascular disease Stable No change required today  5. Chronic dizziness and unsteadiness on feet Probably from neuropathy but pt would like to switch blood thinners to see if any correlation He will be given samples of  xarelto 20 mg a day (cr cl cal 69.9 ml/min) and will start tonight at dinner and take daily for the next 7- 10 days to see if any improvement in dizziness. He is to stop eliquis but aware that he will go back to drug if no improvement in symptoms.  He is instructed to call the office in one week     Butch Penny C. Chrisoula Zegarra, [facility] [address] [place], [place] [address] [phone]

## 2016-07-19 ENCOUNTER — Other Ambulatory Visit: Payer: Medicare Other

## 2016-07-19 ENCOUNTER — Ambulatory Visit: Payer: Medicare Other | Admitting: Internal Medicine

## 2016-07-19 ENCOUNTER — Ambulatory Visit: Payer: Medicare Other

## 2016-07-20 ENCOUNTER — Ambulatory Visit (HOSPITAL_BASED_OUTPATIENT_CLINIC_OR_DEPARTMENT_OTHER): Payer: Medicare Other

## 2016-07-20 ENCOUNTER — Encounter: Payer: Self-pay | Admitting: Internal Medicine

## 2016-07-20 ENCOUNTER — Ambulatory Visit (HOSPITAL_BASED_OUTPATIENT_CLINIC_OR_DEPARTMENT_OTHER): Payer: Medicare Other | Admitting: Internal Medicine

## 2016-07-20 ENCOUNTER — Other Ambulatory Visit (HOSPITAL_BASED_OUTPATIENT_CLINIC_OR_DEPARTMENT_OTHER): Payer: Medicare Other

## 2016-07-20 DIAGNOSIS — C7972 Secondary malignant neoplasm of left adrenal gland: Secondary | ICD-10-CM

## 2016-07-20 DIAGNOSIS — C3490 Malignant neoplasm of unspecified part of unspecified bronchus or lung: Secondary | ICD-10-CM

## 2016-07-20 DIAGNOSIS — R42 Dizziness and giddiness: Secondary | ICD-10-CM

## 2016-07-20 DIAGNOSIS — M545 Low back pain: Secondary | ICD-10-CM | POA: Diagnosis not present

## 2016-07-20 DIAGNOSIS — G893 Neoplasm related pain (acute) (chronic): Secondary | ICD-10-CM

## 2016-07-20 DIAGNOSIS — C3431 Malignant neoplasm of lower lobe, right bronchus or lung: Secondary | ICD-10-CM

## 2016-07-20 DIAGNOSIS — I4891 Unspecified atrial fibrillation: Secondary | ICD-10-CM | POA: Diagnosis not present

## 2016-07-20 DIAGNOSIS — Z5112 Encounter for antineoplastic immunotherapy: Secondary | ICD-10-CM

## 2016-07-20 LAB — COMPREHENSIVE METABOLIC PANEL
ALK PHOS: 96 U/L (ref 40–150)
ALT: 19 U/L (ref 0–55)
AST: 23 U/L (ref 5–34)
Albumin: 3.7 g/dL (ref 3.5–5.0)
Anion Gap: 7 mEq/L (ref 3–11)
BILIRUBIN TOTAL: 0.77 mg/dL (ref 0.20–1.20)
BUN: 16.3 mg/dL (ref 7.0–26.0)
CO2: 25 mEq/L (ref 22–29)
CREATININE: 0.8 mg/dL (ref 0.7–1.3)
Calcium: 9.5 mg/dL (ref 8.4–10.4)
Chloride: 99 mEq/L (ref 98–109)
EGFR: 86 mL/min/{1.73_m2} — AB (ref >90)
GLUCOSE: 86 mg/dL (ref 70–140)
Potassium: 4.4 mEq/L (ref 3.5–5.1)
SODIUM: 131 meq/L — AB (ref 136–145)
TOTAL PROTEIN: 6.1 g/dL — AB (ref 6.4–8.3)

## 2016-07-20 LAB — CBC WITH DIFFERENTIAL/PLATELET
BASO%: 0.4 % (ref 0.0–2.0)
Basophils Absolute: 0 10*3/uL (ref 0.0–0.1)
EOS ABS: 0.2 10*3/uL (ref 0.0–0.5)
EOS%: 4.9 % (ref 0.0–7.0)
HCT: 38.3 % — ABNORMAL LOW (ref 38.4–49.9)
HEMOGLOBIN: 13.8 g/dL (ref 13.0–17.1)
LYMPH%: 22.1 % (ref 14.0–49.0)
MCH: 33.1 pg (ref 27.2–33.4)
MCHC: 36 g/dL (ref 32.0–36.0)
MCV: 91.8 fL (ref 79.3–98.0)
MONO#: 0.6 10*3/uL (ref 0.1–0.9)
MONO%: 13.2 % (ref 0.0–14.0)
NEUT%: 59.4 % (ref 39.0–75.0)
NEUTROS ABS: 2.7 10*3/uL (ref 1.5–6.5)
Platelets: 220 10*3/uL (ref 140–400)
RBC: 4.17 10*6/uL — ABNORMAL LOW (ref 4.20–5.82)
RDW: 13.9 % (ref 11.0–14.6)
WBC: 4.5 10*3/uL (ref 4.0–10.3)
lymph#: 1 10*3/uL (ref 0.9–3.3)

## 2016-07-20 MED ORDER — SODIUM CHLORIDE 0.9 % IV SOLN
240.0000 mg | Freq: Once | INTRAVENOUS | Status: AC
  Administered 2016-07-20: 240 mg via INTRAVENOUS
  Filled 2016-07-20: qty 20

## 2016-07-20 MED ORDER — SODIUM CHLORIDE 0.9 % IV SOLN: Freq: Once | INTRAVENOUS | Status: DC

## 2016-07-20 MED ORDER — HYDROCODONE-ACETAMINOPHEN 5-325 MG PO TABS: 1.0000 | ORAL_TABLET | ORAL | 0 refills | Status: DC | PRN

## 2016-07-20 NOTE — Patient Instructions (Signed)
[facility] Discharge Instructions for Patients Receiving Chemotherapy  Today you received the following chemotherapy agents:  nivolumab.  To help prevent nausea and vomiting after your treatment, we encourage you to take your nausea medication as directed.   If you develop nausea and vomiting that is not controlled by your nausea medication, call the clinic.   BELOW ARE SYMPTOMS THAT SHOULD BE REPORTED IMMEDIATELY:  *FEVER GREATER THAN 100.5 F  *CHILLS WITH OR WITHOUT FEVER  NAUSEA AND VOMITING THAT IS NOT CONTROLLED WITH YOUR NAUSEA MEDICATION  *UNUSUAL SHORTNESS OF BREATH  *UNUSUAL BRUISING OR BLEEDING  TENDERNESS IN MOUTH AND THROAT WITH OR WITHOUT PRESENCE OF ULCERS  *URINARY PROBLEMS  *BOWEL PROBLEMS  UNUSUAL RASH Items with * indicate a potential emergency and should be followed up as soon as possible.  Feel free to call the clinic you have any questions or concerns. The clinic phone number is [phone].  Please show the CHEMO ALERT CARD at check-in to the Emergency Department and triage nurse.   

## 2016-07-20 NOTE — Progress Notes (Signed)
[facility] [phone]   Fax:([phone]) [place], [place] [address]  DIAGNOSIS: Stage IV (T1a, N0, M1b) non-small cell lung cancer of the right lower lobe, adenocarcinoma with negative EGFR mutation and negative ALK gene translocation diagnosed in October of 2014.   Molecular profile: Negative for RET, ALK, BRAF, MET, EGFR.  Positive for ERBB2 A775T, [id number], KRAS G13E, MAP2K1 D67N (see full report).   PRIOR THERAPY:  1) Systemic chemotherapy with carboplatin for AUC of 5 and Alimta 500 mg/M2 every 3 weeks, status post 6 cycles with stable disease. First dose on 10/23/2013. 2) Maintenance systemic chemotherapy with single agent Alimta 500 mg/M2 every 3 weeks. Status post 8 cycles.   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 Sondra Come completed on 12/16/2014.  CURRENT THERAPY: Immunotherapy with Nivolumab 3 MG/KG every 2 weeks, status post 34 cycles. First cycle 01/28/2015  CHEMOTHERAPY INTENT: Palliative  CURRENT # OF CHEMOTHERAPY CYCLES: 35 CURRENT ANTIEMETICS: Zofran, Compazine and dexamethasone  CURRENT SMOKING STATUS: Former smoker  ORAL CHEMOTHERAPY AND CONSENT: None  CURRENT BISPHOSPHONATES USE: None  PAIN MANAGEMENT: 0/10  NARCOTICS INDUCED CONSTIPATION: None  LIVING WILL AND CODE STATUS: ?   INTERVAL HISTORY: Anthony Curry 75 y.o. male returns to the clinic today for followup visit accompanied by his wife. The patient has no complaints today except for worsening dizzy spells. He had MRI of the brain performed recently that showed no evidence for disease progression to the brain. The patient was also seen by his cardiologist last week and the switch of his anticoagulant to Xarelto as a test to see if Eliquis is responsible for his dizzy spells. He did not notice any difference after switching treatment to Xarelto. He is  planning to see his neurologist for reevaluation. He tolerated the last cycle of his treatment with Nivolumab fairly well. He denied having any significant chest pain, shortness of breath, cough or hemoptysis. He has no fever or chills. He denied having any significant nausea or vomiting, no weight loss or night sweats. He has mild low back pain and requesting refill of Vicodin. He is here today to start cycle #35. MEDICAL HISTORY: Past Medical History:  Diagnosis Date  . 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 Adrenal mass ([place])   . Alcoholism in remission ([place])   . Anemia of chronic disease   . 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 Anxiety   . BPH (benign prostatic hyperplasia)   . Cancer ([place])    small cell/lung  . Complication of anesthesia 09/08/2015    JITTERY AFTER GALLBLADDER SURGERY  . GERD (gastroesophageal reflux disease)   . Hypertension   . Neoplastic malignant related fatigue    IV tx. every 2 weeks last9-14-16 (Dr. Earlie Server), radiation last tx. 6 months  . 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 Persistent atrial fibrillation ([place])   . Pulmonary nodules   . Radiation 12/02/14-12/16/14   Left adrenal gland 30 Gy in 10 fractions    ALLERGIES:  has No Known Allergies.  MEDICATIONS:  Current Outpatient Prescriptions  Medication Sig Dispense Refill  . ALPRAZolam (XANAX) 0.25 MG tablet Take 0.25 mg by mouth 3 (three) times daily as needed for anxiety or sleep. And also at night for sleep.    [name] apixaban (ELIQUIS) 5 MG TABS tablet Take 5 mg by mouth 2 (two) times daily.    [name]  Ascorbic Acid (VITAMIN C PO) Take 1,000 mg by mouth daily.     . B Complex-C (SUPER B COMPLEX PO) Take 1 each by mouth daily.     . calcium carbonate (OS-CAL) 600 MG TABS Take 600 mg by mouth daily with breakfast.    . demeclocycline (DECLOMYCIN) 150 MG tablet Take 150 mg by mouth 2 (two) times daily.    [name] diltiazem (CARDIZEM CD) 180 MG 24 hr capsule Take 1 capsule (180 mg total) by mouth  daily at 12 noon. 90 capsule 3  . furosemide (LASIX) 40 MG tablet Take 0.5 tablets (20 mg total) by mouth daily. (Patient taking differently: Take 20 mg by mouth every other day. ) 90 tablet 3  . HYDROcodone-acetaminophen (NORCO/VICODIN) 5-325 MG tablet Take 1-2 tablets by mouth every 4 (four) hours as needed for moderate pain. 30 tablet 0  . KLOR-CON M20 20 MEQ tablet Take 20 mEq by mouth every morning.     . Lactobacillus (ACIDOPHILUS) CAPS capsule Take 1 capsule by mouth daily.    [name] MAGNESIUM PO Take 400 mg by mouth daily.     . Multiple Vitamin (MULTIVITAMIN WITH MINERALS) TABS Take 1 tablet by mouth daily.    . Nivolumab (OPDIVO IV) Inject as directed every 2 weeks    . Nutritional Supplements (ENSURE COMPLETE PO) Take 1 Can by mouth 2 (two) times daily.    . Omega-3 Fatty Acids (FISH OIL PO) Take 1 tablet by mouth daily.    . pantoprazole (PROTONIX) 40 MG tablet Take 40 mg by mouth every morning.  8  . tamsulosin (FLOMAX) 0.4 MG CAPS capsule Take 0.4 mg by mouth daily at 12 noon.     . thiamine (VITAMIN B-1) 100 MG tablet Take 1 tablet (100 mg total) by mouth daily. 30 tablet 6  . vitamin E 400 UNIT capsule Take 400 Units by mouth daily.     No current facility-administered medications for this visit.     SURGICAL HISTORY:  Past Surgical History:  Procedure Laterality Date  . ANKLE ARTHRODESIS  1995   rt fx-hardware in  . CARDIOVERSION N/A 09/29/2014   Procedure: CARDIOVERSION;  Surgeon: Josue Hector, MD;  Location[facility]COPY;  Service: Cardiovascular;  Laterality: N/A;  . CHOLECYSTECTOMY  09/08/2015    laproscopic   . CHOLECYSTECTOMY N/A 09/08/2015   Procedure: LAPAROSCOPIC CHOLECYSTECTOMY WITH ATTEMPTED INTRAOPERATIVE CHOLANGIOGRAM ;  Surgeon: Fanny Skates, MD;  Location: [facility];  Service: General;  Laterality: N/A;  . COLONOSCOPY    . ERCP N/A 09/02/2015   Procedure: ENDOSCOPIC RETROGRADE CHOLANGIOPANCREATOGRAPHY (ERCP);  Surgeon: Arta Silence, MD;  Location: [facility]  ENDOS[facility];  Service: Endoscopy;  Laterality: N/A;  . ERCP N/A 09/07/2015   Procedure: ENDOSCOPIC RETROGRADE CHOLANGIOPANCREATOGRAPHY (ERCP);  Surgeon: Clarene Essex, MD;  Location: [facility] END[facility];  Service: Endoscopy;  Laterality: N/A;  . EXCISION / CURETTAGE BONE CYST FINGER  2005   rt thumb  . HEMORRHOID SURGERY    . Percutaneous Cholecytostomy tube     IVR insertion 06-13-15(Dr. Vernard Gambles)- 08-28-15 remains in place to drainage bag  . TONSILLECTOMY    . TOTAL SHOULDER ARTHROPLASTY  02/03/2012   Procedure: TOTAL SHOULDER ARTHROPLASTY;  Surgeon: [name], MD;  Location: [facility];  Service: Orthopedics;  Laterality: Left;    REVIEW OF SYSTEMS:  Constitutional: negative Eyes: negative Ears, nose, mouth, throat, and face: negative Respiratory: negative Cardiovascular: negative Gastrointestinal: negative Genitourinary:negative Integument/breast: negative Hematologic/lymphatic: negative Musculoskeletal:positive for back pain Neurological: positive for dizziness Behavioral/Psych: negative  Endocrine: negative Allergic/Immunologic: negative   PHYSICAL EXAMINATION: General appearance: alert, cooperative, fatigued and no distress Head: Normocephalic, without obvious abnormality, atraumatic Neck: no adenopathy, no JVD, supple, symmetrical, trachea midline and thyroid not enlarged, symmetric, no tenderness/mass/nodules Lymph nodes: Cervical, supraclavicular, and axillary nodes normal. Resp: clear to auscultation bilaterally Back: symmetric, no curvature. ROM normal. No CVA tenderness. Cardio: regular rate and rhythm, S1, S2 normal, no murmur, click, rub or gallop GI: Right upper quadrant tenderness to palpation Extremities: extremities normal, atraumatic, no cyanosis or edema Neurologic: Alert and oriented X 3, normal strength and tone. Normal symmetric reflexes. Normal coordination and gait  ECOG PERFORMANCE STATUS: 1 - Symptomatic but completely ambulatory  Blood pressure  134/61, pulse 72, temperature 98.2 F (36.8 C), temperature source Oral, resp. rate 18, height 5' 11"  (1.803 m), weight 158 lb 4.8 oz (71.8 kg), SpO2 99 %.  LABORATORY DATA: Lab Results  Component Value Date   WBC 4.5 07/20/2016   HGB 13.8 07/20/2016   HCT 38.3 (L) 07/20/2016   MCV 91.8 07/20/2016   PLT 220 07/20/2016      Chemistry      Component Value Date/Time   NA 132 (L) 07/06/2016 0950   K 4.5 07/06/2016 0950   CL 94 (L) 01/07/2016 1844   CO2 24 07/06/2016 0950   BUN 19.6 07/06/2016 0950   CREATININE 0.9 07/06/2016 0950      Component Value Date/Time   CALCIUM 9.2 07/06/2016 0950   ALKPHOS 85 07/06/2016 0950   AST 20 07/06/2016 0950   ALT 16 07/06/2016 0950   BILITOT 0.76 07/06/2016 0950       RADIOGRAPHIC STUDIES: [name] HG Contrast  Result Date: 07/06/2016 CLINICAL DATA:  Worsening dizziness for the past 8 months. Gait instability. History of stage IV non-small cell lung cancer status post chemotherapy. EXAM: MRI HEAD WITHOUT AND WITH CONTRAST TECHNIQUE: Multiplanar, multiecho pulse sequences of the brain and surrounding structures were obtained without and with intravenous contrast. CONTRAST:  9m MULTIHANCE GADOBENATE DIMEGLUMINE 529 MG/ML IV SOLN COMPARISON:  05/12/2015 FINDINGS: There is no evidence of acute infarct, intracranial hemorrhage, mass, midline shift, or extra-axial fluid collection. Mild-to-moderate cerebral atrophy is unchanged. T2 hyperintensities throughout the cerebral white matter are unchanged and compatible with moderate chronic small vessel ischemic disease. No abnormal enhancement is identified. Prior bilateral cataract extraction is noted. There is mild mucosal thickening in the right sphenoid and right maxillary sinuses which has mildly increased from the prior study. Right mastoid air cell fluid has decreased. Major intracranial vascular flow voids are preserved. No focal osseous lesion is identified. IMPRESSION: No evidence of intracranial  metastases or acute abnormality. Electronically Signed   By: ALogan BoresM.D.   On: 07/06/2016 17:51   ASSESSMENT AND PLAN: This is a very pleasant 75years old white male with:  1)  Stage IV non-small cell lung cancer, adenocarcinoma status post induction chemotherapy with carboplatin and Alimta and currently on maintenance treatment with single agent Alimta status post 8 cycles. He has been observation for the last few months.  He had evidence for disease progression on the left adrenal gland and the patient underwent palliative radiotherapy to the left adrenal gland under the care of Dr. KSondra Comecompleted on 12/16/2014. He is currently undergoing treatment with immunotherapy with Nivolumab status post 34 cycles and tolerating his treatment fairly well.  I recommended for him to continue his current treatment with immunotherapy as a scheduled with cycle #35 today. The patient would come back for  follow-up visit in 2 weeks with the start of cycle #36.  2) Atrial fibrillation: He'll continue his current medication with Cardizem, digoxin and metoprolol in addition to Xarelto which is expected to change back to Eliquis as prescribed by his cardiologist Dr. [name].  3) dizziness: MRI of the brain showed no evidence of metastatic disease to the brain. The patient would see his neurologist for evaluation.  4) back pain: He was given a refill of Vicodin.   He was advised to call immediately if he has any concerning symptoms in the interval. The patient voices understanding of current disease status and treatment options and is in agreement with the current care plan.  All questions were answered. The patient knows to call the clinic with any problems, questions or concerns. We can certainly see the patient much sooner if necessary.  Disclaimer: This note was dictated with voice recognition software. Similar sounding words can inadvertently be transcribed and may not be corrected upon review.

## 2016-07-21 ENCOUNTER — Other Ambulatory Visit (HOSPITAL_COMMUNITY): Payer: Self-pay | Admitting: *Deleted

## 2016-07-21 ENCOUNTER — Telehealth (HOSPITAL_COMMUNITY): Payer: Self-pay | Admitting: *Deleted

## 2016-07-21 MED ORDER — FUROSEMIDE 40 MG PO TABS: 20.0000 mg | ORAL_TABLET | ORAL | Status: DC

## 2016-07-21 NOTE — Telephone Encounter (Signed)
Pt called in stated no change in dizziness with change to xarelto . Will go back to taking Eliquis since no difference noted. Pt will follow up with PCP/oncologist regarding dizziness. Pt instructed how to resume Eliquis from Xarelto. Verbalized understanding.

## 2016-07-25 ENCOUNTER — Telehealth: Payer: Self-pay | Admitting: Internal Medicine

## 2016-07-25 NOTE — Telephone Encounter (Signed)
Appointment dates conf with patient. 07/25/16

## 2016-08-03 ENCOUNTER — Other Ambulatory Visit (HOSPITAL_BASED_OUTPATIENT_CLINIC_OR_DEPARTMENT_OTHER): Payer: Medicare Other

## 2016-08-03 ENCOUNTER — Ambulatory Visit (HOSPITAL_BASED_OUTPATIENT_CLINIC_OR_DEPARTMENT_OTHER): Payer: Medicare Other | Admitting: Internal Medicine

## 2016-08-03 ENCOUNTER — Ambulatory Visit (HOSPITAL_BASED_OUTPATIENT_CLINIC_OR_DEPARTMENT_OTHER): Payer: Medicare Other

## 2016-08-03 ENCOUNTER — Encounter: Payer: Self-pay | Admitting: Internal Medicine

## 2016-08-03 VITALS — BP 124/62 | HR 79 | Temp 97.5°F | Resp 18 | Ht 71.0 in | Wt 156.1 lb

## 2016-08-03 DIAGNOSIS — M545 Low back pain: Secondary | ICD-10-CM | POA: Diagnosis not present

## 2016-08-03 DIAGNOSIS — Z5112 Encounter for antineoplastic immunotherapy: Secondary | ICD-10-CM | POA: Diagnosis present

## 2016-08-03 DIAGNOSIS — C3431 Malignant neoplasm of lower lobe, right bronchus or lung: Secondary | ICD-10-CM | POA: Diagnosis not present

## 2016-08-03 DIAGNOSIS — C7972 Secondary malignant neoplasm of left adrenal gland: Secondary | ICD-10-CM

## 2016-08-03 DIAGNOSIS — I4891 Unspecified atrial fibrillation: Secondary | ICD-10-CM | POA: Diagnosis not present

## 2016-08-03 DIAGNOSIS — Z79899 Other long term (current) drug therapy: Secondary | ICD-10-CM

## 2016-08-03 DIAGNOSIS — R42 Dizziness and giddiness: Secondary | ICD-10-CM | POA: Diagnosis not present

## 2016-08-03 DIAGNOSIS — R530 Neoplastic (malignant) related fatigue: Secondary | ICD-10-CM

## 2016-08-03 DIAGNOSIS — I4892 Unspecified atrial flutter: Secondary | ICD-10-CM

## 2016-08-03 LAB — CBC WITH DIFFERENTIAL/PLATELET
BASO%: 0.5 % (ref 0.0–2.0)
BASOS ABS: 0 10*3/uL (ref 0.0–0.1)
EOS%: 6.3 % (ref 0.0–7.0)
Eosinophils Absolute: 0.3 10*3/uL (ref 0.0–0.5)
HEMATOCRIT: 43.8 % (ref 38.4–49.9)
HEMOGLOBIN: 14.8 g/dL (ref 13.0–17.1)
LYMPH#: 1.1 10*3/uL (ref 0.9–3.3)
LYMPH%: 23.4 % (ref 14.0–49.0)
MCH: 32.7 pg (ref 27.2–33.4)
MCHC: 33.9 g/dL (ref 32.0–36.0)
MCV: 96.4 fL (ref 79.3–98.0)
MONO#: 0.5 10*3/uL (ref 0.1–0.9)
MONO%: 10 % (ref 0.0–14.0)
NEUT#: 2.7 10*3/uL (ref 1.5–6.5)
NEUT%: 59.8 % (ref 39.0–75.0)
PLATELETS: 215 10*3/uL (ref 140–400)
RBC: 4.54 10*6/uL (ref 4.20–5.82)
RDW: 13.6 % (ref 11.0–14.6)
WBC: 4.6 10*3/uL (ref 4.0–10.3)

## 2016-08-03 LAB — COMPREHENSIVE METABOLIC PANEL
ALBUMIN: 3.9 g/dL (ref 3.5–5.0)
ALK PHOS: 96 U/L (ref 40–150)
ALT: 18 U/L (ref 0–55)
ANION GAP: 10 meq/L (ref 3–11)
AST: 23 U/L (ref 5–34)
BUN: 16.6 mg/dL (ref 7.0–26.0)
CALCIUM: 9.6 mg/dL (ref 8.4–10.4)
CHLORIDE: 100 meq/L (ref 98–109)
CO2: 24 mEq/L (ref 22–29)
CREATININE: 0.9 mg/dL (ref 0.7–1.3)
EGFR: 84 mL/min/{1.73_m2} — ABNORMAL LOW (ref >90)
Glucose: 100 mg/dl (ref 70–140)
POTASSIUM: 3.7 meq/L (ref 3.5–5.1)
Sodium: 135 mEq/L — ABNORMAL LOW (ref 136–145)
Total Bilirubin: 0.96 mg/dL (ref 0.20–1.20)
Total Protein: 6.6 g/dL (ref 6.4–8.3)

## 2016-08-03 LAB — TSH: TSH: 2.654 m(IU)/L (ref 0.320–4.118)

## 2016-08-03 MED ORDER — SODIUM CHLORIDE 0.9 % IV SOLN
240.0000 mg | Freq: Once | INTRAVENOUS | Status: AC
  Administered 2016-08-03: 240 mg via INTRAVENOUS
  Filled 2016-08-03: qty 4

## 2016-08-03 MED ORDER — SODIUM CHLORIDE 0.9 % IV SOLN
Freq: Once | INTRAVENOUS | Status: AC
  Administered 2016-08-03: 11:00:00 via INTRAVENOUS

## 2016-08-03 NOTE — Patient Instructions (Signed)
[facility] Discharge Instructions for Patients Receiving Chemotherapy  Today you received the following chemotherapy agents:  nivolumab.  To help prevent nausea and vomiting after your treatment, we encourage you to take your nausea medication as directed.   If you develop nausea and vomiting that is not controlled by your nausea medication, call the clinic.   BELOW ARE SYMPTOMS THAT SHOULD BE REPORTED IMMEDIATELY:  *FEVER GREATER THAN 100.5 F  *CHILLS WITH OR WITHOUT FEVER  NAUSEA AND VOMITING THAT IS NOT CONTROLLED WITH YOUR NAUSEA MEDICATION  *UNUSUAL SHORTNESS OF BREATH  *UNUSUAL BRUISING OR BLEEDING  TENDERNESS IN MOUTH AND THROAT WITH OR WITHOUT PRESENCE OF ULCERS  *URINARY PROBLEMS  *BOWEL PROBLEMS  UNUSUAL RASH Items with * indicate a potential emergency and should be followed up as soon as possible.  Feel free to call the clinic you have any questions or concerns. The clinic phone number is [phone].  Please show the CHEMO ALERT CARD at check-in to the Emergency Department and triage nurse.   

## 2016-08-03 NOTE — Progress Notes (Signed)
[place] Telephone:(336) [phone]   Fax:(336) [place], [place] [address]  DIAGNOSIS: Stage IV (T1a, N0, M1b) non-small cell lung cancer of the right lower lobe, adenocarcinoma with negative EGFR mutation and negative ALK gene translocation diagnosed in October of 2014.   Molecular profile: Negative for RET, ALK, BRAF, MET, EGFR.  Positive for ERBB2 A775T, [id number], KRAS G13E, MAP2K1 D67N (see full report).   PRIOR THERAPY:  1) Systemic chemotherapy with carboplatin for AUC of 5 and Alimta 500 mg/M2 every 3 weeks, status post 6 cycles with stable disease. First dose on 10/23/2013. 2) Maintenance systemic chemotherapy with single agent Alimta 500 mg/M2 every 3 weeks. Status post 8 cycles.   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 Sondra Come completed on 12/16/2014.  CURRENT THERAPY: Immunotherapy with Nivolumab 3 MG/KG every 2 weeks, status post 35 cycles. First cycle 01/28/2015  CHEMOTHERAPY INTENT: Palliative  CURRENT # OF CHEMOTHERAPY CYCLES: 36 CURRENT ANTIEMETICS: Zofran, Compazine and dexamethasone  CURRENT SMOKING STATUS: Former smoker  ORAL CHEMOTHERAPY AND CONSENT: None  CURRENT BISPHOSPHONATES USE: None  PAIN MANAGEMENT: 0/10  NARCOTICS INDUCED CONSTIPATION: None  LIVING WILL AND CODE STATUS: ?   INTERVAL HISTORY: Anthony Curry 75 y.o. male returns to the clinic today for followup visit. The patient has no complaints today except for worsening dizzy spells. He will schedule an appointment with his neurologist for evaluation of this condition. He has been this evaluation with MRI of the brain that was unremarkable. He was also seen by his cardiologist. He tolerated the last cycle of his treatment with Nivolumab fairly well. He denied having any significant chest pain, shortness of breath, cough or hemoptysis. He has no fever or  chills. He denied having any significant nausea or vomiting, no weight loss or night sweats. He is here today to start cycle #36 of his treatment.  MEDICAL HISTORY: Past Medical History:  Diagnosis Date  . 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[place])   . Adrenal mass ([place])   . Alcoholism in remission ([place])   . Anemia of chronic disease   . 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 Anxiety   . BPH (benign prostatic hyperplasia)   . Cancer ([place])    small cell/lung  . Complication of anesthesia 09/08/2015    JITTERY AFTER GALLBLADDER SURGERY  . GERD (gastroesophageal reflux disease)   . Hypertension   . Neoplastic malignant related fatigue    IV tx. every 2 weeks last9-14-16 (Dr. Earlie Server), radiation last tx. 6 months  . 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 Persistent atrial fibrillation ([place])   . Pulmonary nodules   . Radiation 12/02/14-12/16/14   Left adrenal gland 30 Gy in 10 fractions    ALLERGIES:  has No Known Allergies.  MEDICATIONS:  Current Outpatient Prescriptions  Medication Sig Dispense Refill  . ALPRAZolam (XANAX) 0.25 MG tablet Take 0.25 mg by mouth 3 (three) times daily as needed for anxiety or sleep. And also at night for sleep.    [name] apixaban (ELIQUIS) 5 MG TABS tablet Take 5 mg by mouth 2 (two) times daily.    . Ascorbic Acid (VITAMIN C PO) Take 1,000 mg by mouth daily.     . B Complex-C (SUPER B COMPLEX PO) Take 1 each by mouth daily.     . calcium carbonate (OS-CAL) 600 MG TABS Take 600 mg by mouth daily with breakfast.    .  demeclocycline (DECLOMYCIN) 150 MG tablet Take 150 mg by mouth 2 (two) times daily.    [name] diltiazem (CARDIZEM CD) 180 MG 24 hr capsule Take 1 capsule (180 mg total) by mouth daily at 12 noon. 90 capsule 3  . furosemide (LASIX) 40 MG tablet Take 0.5 tablets (20 mg total) by mouth every other day.    [name] HYDROcodone-acetaminophen (NORCO/VICODIN) 5-325 MG tablet Take 1-2 tablets by mouth every 4 (four) hours as needed for moderate pain. 30 tablet 0   . KLOR-CON M20 20 MEQ tablet Take 20 mEq by mouth every morning.     . Lactobacillus (ACIDOPHILUS) CAPS capsule Take 1 capsule by mouth daily.    [name] MAGNESIUM PO Take 400 mg by mouth daily.     . Multiple Vitamin (MULTIVITAMIN WITH MINERALS) TABS Take 1 tablet by mouth daily.    . Nivolumab (OPDIVO IV) Inject as directed every 2 weeks    . Nutritional Supplements (ENSURE COMPLETE PO) Take 1 Can by mouth 2 (two) times daily.    . Omega-3 Fatty Acids (FISH OIL PO) Take 1 tablet by mouth daily.    . pantoprazole (PROTONIX) 40 MG tablet Take 40 mg by mouth every morning.  8  . tamsulosin (FLOMAX) 0.4 MG CAPS capsule Take 0.4 mg by mouth daily at 12 noon.     . thiamine (VITAMIN B-1) 100 MG tablet Take 1 tablet (100 mg total) by mouth daily. 30 tablet 6  . vitamin E 400 UNIT capsule Take 400 Units by mouth daily.     No current facility-administered medications for this visit.    Facility-Administered Medications Ordered in Other Visits  Medication Dose Route Frequency Provider Last Rate Last Dose  . 0.9 %  sodium chloride infusion   Intravenous Once [name], MD        SURGICAL HISTORY:  Past Surgical History:  Procedure Laterality Date  . ANKLE ARTHRODESIS  1995   rt fx-hardware in  . CARDIOVERSION N/A 09/29/2014   Procedure: CARDIOVERSION;  Surgeon: Josue Hector, MD;  Location: [facility] ENDOSCOPY;  Service: Cardiovascular;  Laterality: N/A;  . CHOLECYSTECTOMY  09/08/2015    laproscopic   . CHOLECYSTECTOMY N/A 09/08/2015   Procedure: LAPAROSCOPIC CHOLECYSTECTOMY WITH ATTEMPTED INTRAOPERATIVE CHOLANGIOGRAM ;  Surgeon: Fanny Skates, MD;  Location: [facility];  Service: General;  Laterality: N/A;  . COLONOSCOPY    . ERCP N/A 09/02/2015   Procedure: ENDOSCOPIC RETROGRADE CHOLANGIOPANCREATOGRAPHY (ERCP);  Surgeon: Arta Silence, MD;  Location: [facility];  Service: Endoscopy;  Laterality: N/A;  . ERCP N/A 09/07/2015   Procedure: ENDOSCOPIC RETROGRADE CHOLANGIOPANCREATOGRAPHY (ERCP);   Surgeon: Clarene Essex, MD;  Location: [facility]OS[facility];  Service: Endoscopy;  Laterality: N/A;  . EXCISION / CURETTAGE BONE CYST FINGER  2005   rt thumb  . HEMORRHOID SURGERY    . Percutaneous Cholecytostomy tube     IVR insertion 06-13-15(Dr. Vernard Gambles)- 08-28-15 remains in place to drainage bag  . TONSILLECTOMY    . TOTAL SHOULDER ARTHROPLASTY  02/03/2012   Procedure: TOTAL SHOULDER ARTHROPLASTY;  Surgeon: [name], MD;  Location: [facility];  Service: Orthopedics;  Laterality: Left;    REVIEW OF SYSTEMS:  A comprehensive review of systems was negative except for: Neurological: positive for dizziness   PHYSICAL EXAMINATION: General appearance: alert, cooperative, fatigued and no distress Head: Normocephalic, without obvious abnormality, atraumatic Neck: no adenopathy, no JVD, supple, symmetrical, trachea midline and thyroid not enlarged, symmetric, no tenderness/mass/nodules Lymph nodes: Cervical, supraclavicular, and axillary nodes normal. Resp: clear  to auscultation bilaterally Back: symmetric, no curvature. ROM normal. No CVA tenderness. Cardio: regular rate and rhythm, S1, S2 normal, no murmur, click, rub or gallop GI: Right upper quadrant tenderness to palpation Extremities: extremities normal, atraumatic, no cyanosis or edema Neurologic: Alert and oriented X 3, normal strength and tone. Normal symmetric reflexes. Normal coordination and gait  ECOG PERFORMANCE STATUS: 1 - Symptomatic but completely ambulatory  Blood pressure 124/62, pulse 79, temperature 97.5 F (36.4 C), temperature source Oral, resp. rate 18, height _0  (1.803 m), weight 156 lb 1.6 oz (70.8 kg), SpO2 97 %.  LABORATORY DATA: Lab Results  Component Value Date   WBC 4.6 08/03/2016   HGB 14.8 08/03/2016   HCT 43.8 08/03/2016   MCV 96.4 08/03/2016   PLT 215 08/03/2016      Chemistry      Component Value Date/Time   NA 135 (L) 08/03/2016 0953   K 3.7 08/03/2016 0953   CL 94 (L) 01/07/2016  1844   CO2 24 08/03/2016 0953   BUN 16.6 08/03/2016 0953   CREATININE 0.9 08/03/2016 0953      Component Value Date/Time   CALCIUM 9.6 08/03/2016 0953   ALKPHOS 96 08/03/2016 0953   AST 23 08/03/2016 0953   ALT 18 08/03/2016 0953   BILITOT 0.96 08/03/2016 0953       RADIOGRAPHIC STUDIES: [name] DI Contrast  Result Date: 07/06/2016 CLINICAL DATA:  Worsening dizziness for the past 8 months. Gait instability. History of stage IV non-small cell lung cancer status post chemotherapy. EXAM: MRI HEAD WITHOUT AND WITH CONTRAST TECHNIQUE: Multiplanar, multiecho pulse sequences of the brain and surrounding structures were obtained without and with intravenous contrast. CONTRAST:  44m MULTIHANCE GADOBENATE DIMEGLUMINE 529 MG/ML IV SOLN COMPARISON:  05/12/2015 FINDINGS: There is no evidence of acute infarct, intracranial hemorrhage, mass, midline shift, or extra-axial fluid collection. Mild-to-moderate cerebral atrophy is unchanged. T2 hyperintensities throughout the cerebral white matter are unchanged and compatible with moderate chronic small vessel ischemic disease. No abnormal enhancement is identified. Prior bilateral cataract extraction is noted. There is mild mucosal thickening in the right sphenoid and right maxillary sinuses which has mildly increased from the prior study. Right mastoid air cell fluid has decreased. Major intracranial vascular flow voids are preserved. No focal osseous lesion is identified. IMPRESSION: No evidence of intracranial metastases or acute abnormality. Electronically Signed   By: ALogan BoresM.D.   On: 07/06/2016 17:51   ASSESSMENT AND PLAN: This is a very pleasant 75years old white male with:  1)  Stage IV non-small cell lung cancer, adenocarcinoma status post induction chemotherapy with carboplatin and Alimta and currently on maintenance treatment with single agent Alimta status post 8 cycles. He has been observation for the last few months.  He had evidence for  disease progression on the left adrenal gland and the patient underwent palliative radiotherapy to the left adrenal gland under the care of Dr. KSondra Comecompleted on 12/16/2014. He is currently undergoing treatment with immunotherapy with Nivolumab status post 35 cycles and tolerating his treatment fairly well.  I recommended for him to continue his current treatment with immunotherapy as a scheduled with cycle #36 today. The patient would come back for follow-up visit in 2 weeks with the start of cycle #37.  2) Atrial fibrillation: He'll continue his current medication with Cardizem, digoxin and metoprolol in addition to Eliquis as prescribed by his cardiologist Dr. ARayann Heman  3) dizziness: MRI of the brain showed no evidence of metastatic disease  to the brain. The patient would see his neurologist for evaluation.  4) back pain: He will continue on Vicodin.   He was advised to call immediately if he has any concerning symptoms in the interval. The patient voices understanding of current disease status and treatment options and is in agreement with the current care plan.  All questions were answered. The patient knows to call the clinic with any problems, questions or concerns. We can certainly see the patient much sooner if necessary.  Disclaimer: This note was dictated with voice recognition software. Similar sounding words can inadvertently be transcribed and may not be corrected upon review.

## 2016-08-17 ENCOUNTER — Other Ambulatory Visit (HOSPITAL_BASED_OUTPATIENT_CLINIC_OR_DEPARTMENT_OTHER): Payer: Medicare Other

## 2016-08-17 ENCOUNTER — Ambulatory Visit (HOSPITAL_BASED_OUTPATIENT_CLINIC_OR_DEPARTMENT_OTHER): Payer: Medicare Other

## 2016-08-17 ENCOUNTER — Encounter: Payer: Self-pay | Admitting: Internal Medicine

## 2016-08-17 ENCOUNTER — Ambulatory Visit (HOSPITAL_BASED_OUTPATIENT_CLINIC_OR_DEPARTMENT_OTHER): Payer: Medicare Other | Admitting: Internal Medicine

## 2016-08-17 VITALS — BP 138/69 | HR 77 | Temp 97.6°F | Resp 18 | Ht 71.0 in | Wt 154.9 lb

## 2016-08-17 DIAGNOSIS — Z5112 Encounter for antineoplastic immunotherapy: Secondary | ICD-10-CM

## 2016-08-17 DIAGNOSIS — M545 Low back pain: Secondary | ICD-10-CM

## 2016-08-17 DIAGNOSIS — R42 Dizziness and giddiness: Secondary | ICD-10-CM

## 2016-08-17 DIAGNOSIS — C3431 Malignant neoplasm of lower lobe, right bronchus or lung: Secondary | ICD-10-CM

## 2016-08-17 DIAGNOSIS — C7972 Secondary malignant neoplasm of left adrenal gland: Secondary | ICD-10-CM

## 2016-08-17 DIAGNOSIS — I4891 Unspecified atrial fibrillation: Secondary | ICD-10-CM | POA: Diagnosis not present

## 2016-08-17 LAB — COMPREHENSIVE METABOLIC PANEL
ALBUMIN: 3.9 g/dL (ref 3.5–5.0)
ALK PHOS: 100 U/L (ref 40–150)
ALT: 16 U/L (ref 0–55)
ANION GAP: 10 meq/L (ref 3–11)
AST: 21 U/L (ref 5–34)
BILIRUBIN TOTAL: 0.89 mg/dL (ref 0.20–1.20)
BUN: 19.5 mg/dL (ref 7.0–26.0)
CALCIUM: 9.6 mg/dL (ref 8.4–10.4)
CO2: 25 mEq/L (ref 22–29)
Chloride: 100 mEq/L (ref 98–109)
Creatinine: 0.9 mg/dL (ref 0.7–1.3)
EGFR: 78 mL/min/{1.73_m2} — AB (ref >90)
GLUCOSE: 118 mg/dL (ref 70–140)
POTASSIUM: 4.2 meq/L (ref 3.5–5.1)
Sodium: 134 mEq/L — ABNORMAL LOW (ref 136–145)
TOTAL PROTEIN: 6.6 g/dL (ref 6.4–8.3)

## 2016-08-17 LAB — CBC WITH DIFFERENTIAL/PLATELET
BASO%: 0.5 % (ref 0.0–2.0)
BASOS ABS: 0 10*3/uL (ref 0.0–0.1)
EOS ABS: 0.2 10*3/uL (ref 0.0–0.5)
EOS%: 4.4 % (ref 0.0–7.0)
HEMATOCRIT: 43.4 % (ref 38.4–49.9)
HEMOGLOBIN: 15.5 g/dL (ref 13.0–17.1)
LYMPH%: 24.2 % (ref 14.0–49.0)
MCH: 33.3 pg (ref 27.2–33.4)
MCHC: 35.7 g/dL (ref 32.0–36.0)
MCV: 93.1 fL (ref 79.3–98.0)
MONO#: 0.5 10*3/uL (ref 0.1–0.9)
MONO%: 10.6 % (ref 0.0–14.0)
NEUT%: 60.3 % (ref 39.0–75.0)
NEUTROS ABS: 2.6 10*3/uL (ref 1.5–6.5)
PLATELETS: 205 10*3/uL (ref 140–400)
RBC: 4.66 10*6/uL (ref 4.20–5.82)
RDW: 13.8 % (ref 11.0–14.6)
WBC: 4.3 10*3/uL (ref 4.0–10.3)
lymph#: 1.1 10*3/uL (ref 0.9–3.3)

## 2016-08-17 MED ORDER — SODIUM CHLORIDE 0.9 % IV SOLN
Freq: Once | INTRAVENOUS | Status: AC
  Administered 2016-08-17: 12:00:00 via INTRAVENOUS

## 2016-08-17 MED ORDER — SODIUM CHLORIDE 0.9 % IV SOLN
240.0000 mg | Freq: Once | INTRAVENOUS | Status: AC
  Administered 2016-08-17: 240 mg via INTRAVENOUS
  Filled 2016-08-17: qty 20

## 2016-08-17 NOTE — Patient Instructions (Signed)
[facility] Discharge Instructions for Patients Receiving Chemotherapy  Today you received the following chemotherapy agents:  nivolumab.  To help prevent nausea and vomiting after your treatment, we encourage you to take your nausea medication as directed.   If you develop nausea and vomiting that is not controlled by your nausea medication, call the clinic.   BELOW ARE SYMPTOMS THAT SHOULD BE REPORTED IMMEDIATELY:  *FEVER GREATER THAN 100.5 F  *CHILLS WITH OR WITHOUT FEVER  NAUSEA AND VOMITING THAT IS NOT CONTROLLED WITH YOUR NAUSEA MEDICATION  *UNUSUAL SHORTNESS OF BREATH  *UNUSUAL BRUISING OR BLEEDING  TENDERNESS IN MOUTH AND THROAT WITH OR WITHOUT PRESENCE OF ULCERS  *URINARY PROBLEMS  *BOWEL PROBLEMS  UNUSUAL RASH Items with * indicate a potential emergency and should be followed up as soon as possible.  Feel free to call the clinic you have any questions or concerns. The clinic phone number is [phone].  Please show the CHEMO ALERT CARD at check-in to the Emergency Department and triage nurse.   

## 2016-08-17 NOTE — Progress Notes (Signed)
[place] [phone]   Fax:(336) [place], [place] [address]  DIAGNOSIS: Stage IV (T1a, N0, M1b) non-small cell lung cancer of the right lower lobe, adenocarcinoma with negative EGFR mutation and negative ALK gene translocation diagnosed in October of 2014.   Molecular profile: Negative for RET, ALK, BRAF, MET, EGFR.  Positive for ERBB2 A775T, [id number], KRAS G13E, MAP2K1 D67N (see full report).   PRIOR THERAPY:  1) Systemic chemotherapy with carboplatin for AUC of 5 and Alimta 500 mg/M2 every 3 weeks, status post 6 cycles with stable disease. First dose on 10/23/2013. 2) Maintenance systemic chemotherapy with single agent Alimta 500 mg/M2 every 3 weeks. Status post 8 cycles.   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 Sondra Come completed on 12/16/2014.  CURRENT THERAPY: Immunotherapy with Nivolumab 3 MG/KG every 2 weeks, status post 36 cycles. First cycle 01/28/2015  CHEMOTHERAPY INTENT: Palliative  CURRENT # OF CHEMOTHERAPY CYCLES: 37 CURRENT ANTIEMETICS: Zofran, Compazine and dexamethasone  CURRENT SMOKING STATUS: Former smoker  ORAL CHEMOTHERAPY AND CONSENT: None  CURRENT BISPHOSPHONATES USE: None  PAIN MANAGEMENT: 0/10  NARCOTICS INDUCED CONSTIPATION: None  LIVING WILL AND CODE STATUS: ?   INTERVAL HISTORY: Anthony Curry 75 y.o. male returns to the clinic today for followup visit accompanied by his wife. The patient has no complaints today except for intermittent dizzy spells. He has not seen by his neurologist for evaluation of this condition yet. He tolerated the last cycle of his treatment with Nivolumab fairly well. He denied having any significant chest pain, shortness of breath, cough or hemoptysis. He has no fever or chills. He denied having any significant nausea or vomiting, no weight loss or night sweats. He is  here today to start cycle #37 of his treatment.  MEDICAL HISTORY: Past Medical History:  Diagnosis Date  . 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[place])   . Adrenal mass ([place])   . Alcoholism in remission ([place])   . Anemia of chronic disease   . 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 Anxiety   . BPH (benign prostatic hyperplasia)   . Cancer ([place])    small cell/lung  . Complication of anesthesia 09/08/2015    JITTERY AFTER GALLBLADDER SURGERY  . GERD (gastroesophageal reflux disease)   . Hypertension   . Neoplastic malignant related fatigue    IV tx. every 2 weeks last9-14-16 (Dr. Earlie Server), radiation last tx. 6 months  . 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 Persistent atrial fibrillation ([place])   . Pulmonary nodules   . Radiation 12/02/14-12/16/14   Left adrenal gland 30 Gy in 10 fractions    ALLERGIES:  has No Known Allergies.  MEDICATIONS:  Current Outpatient Prescriptions  Medication Sig Dispense Refill  . ALPRAZolam (XANAX) 0.25 MG tablet Take 0.25 mg by mouth 3 (three) times daily as needed for anxiety or sleep. And also at night for sleep.    [name] apixaban (ELIQUIS) 5 MG TABS tablet Take 5 mg by mouth 2 (two) times daily.    . Ascorbic Acid (VITAMIN C PO) Take 1,000 mg by mouth daily.     . B Complex-C (SUPER B COMPLEX PO) Take 1 each by mouth daily.     . calcium carbonate (OS-CAL) 600 MG TABS Take 600 mg by mouth daily with breakfast.    . demeclocycline (DECLOMYCIN) 150 MG tablet Take 150 mg by mouth 2 (two)  times daily.    [name] diltiazem (CARDIZEM CD) 180 MG 24 hr capsule Take 1 capsule (180 mg total) by mouth daily at 12 noon. 90 capsule 3  . furosemide (LASIX) 40 MG tablet Take 0.5 tablets (20 mg total) by mouth every other day.    [name] HYDROcodone-acetaminophen (NORCO/VICODIN) 5-325 MG tablet Take 1-2 tablets by mouth every 4 (four) hours as needed for moderate pain. 30 tablet 0  . KLOR-CON M20 20 MEQ tablet Take 20 mEq by mouth every morning.     . Lactobacillus  (ACIDOPHILUS) CAPS capsule Take 1 capsule by mouth daily.    [name] MAGNESIUM PO Take 400 mg by mouth daily.     . Multiple Vitamin (MULTIVITAMIN WITH MINERALS) TABS Take 1 tablet by mouth daily.    . Nivolumab (OPDIVO IV) Inject as directed every 2 weeks    . Nutritional Supplements (ENSURE COMPLETE PO) Take 1 Can by mouth 2 (two) times daily.    . Omega-3 Fatty Acids (FISH OIL PO) Take 1 tablet by mouth daily.    . pantoprazole (PROTONIX) 40 MG tablet Take 40 mg by mouth every morning.  8  . tamsulosin (FLOMAX) 0.4 MG CAPS capsule Take 0.4 mg by mouth daily at 12 noon.     . thiamine (VITAMIN B-1) 100 MG tablet Take 1 tablet (100 mg total) by mouth daily. 30 tablet 6  . vitamin E 400 UNIT capsule Take 400 Units by mouth daily.     No current facility-administered medications for this visit.    Facility-Administered Medications Ordered in Other Visits  Medication Dose Route Frequency Provider Last Rate Last Dose  . 0.9 %  sodium chloride infusion   Intravenous Once [name], MD        SURGICAL HISTORY:  Past Surgical History:  Procedure Laterality Date  . ANKLE ARTHRODESIS  1995   rt fx-hardware in  . CARDIOVERSION N/A 09/29/2014   Procedure: CARDIOVERSION;  Surgeon: Josue Hector, MD;  Location: [facility] ENDOSCOPY;  Service: Cardiovascular;  Laterality: N/A;  . CHOLECYSTECTOMY  09/08/2015    laproscopic   . CHOLECYSTECTOMY N/A 09/08/2015   Procedure: LAPAROSCOPIC CHOLECYSTECTOMY WITH ATTEMPTED INTRAOPERATIVE CHOLANGIOGRAM ;  Surgeon: Fanny Skates, MD;  Location: [facility];  Service: General;  Laterality: N/A;  . COLONOSCOPY    . ERCP N/A 09/02/2015   Procedure: ENDOSCOPIC RETROGRADE CHOLANGIOPANCREATOGRAPHY (ERCP);  Surgeon: Arta Silence, MD;  Location: [facility];  Service: Endoscopy;  Laterality: N/A;  . ERCP N/A 09/07/2015   Procedure: ENDOSCOPIC RETROGRADE CHOLANGIOPANCREATOGRAPHY (ERCP);  Surgeon: Clarene Essex, MD;  Location: [facility];  Service: Endoscopy;  Laterality: N/A;  .  EXCISION / CURETTAGE BONE CYST FINGER  2005   rt thumb  . HEMORRHOID SURGERY    . Percutaneous Cholecytostomy tube     IVR insertion 06-13-15(Dr. Vernard Gambles)- 08-28-15 remains in place to drainage bag  . TONSILLECTOMY    . TOTAL SHOULDER ARTHROPLASTY  02/03/2012   Procedure: TOTAL SHOULDER ARTHROPLASTY;  Surgeon: [name], MD;  Location: [facility];  Service: Orthopedics;  Laterality: Left;    REVIEW OF SYSTEMS:  A comprehensive review of systems was negative except for: Neurological: positive for dizziness   PHYSICAL EXAMINATION: General appearance: alert, cooperative, fatigued and no distress Head: Normocephalic, without obvious abnormality, atraumatic Neck: no adenopathy, no JVD, supple, symmetrical, trachea midline and thyroid not enlarged, symmetric, no tenderness/mass/nodules Lymph nodes: Cervical, supraclavicular, and axillary nodes normal. Resp: clear to auscultation bilaterally Back: symmetric, no curvature. ROM normal. No CVA tenderness.  Cardio: regular rate and rhythm, S1, S2 normal, no murmur, click, rub or gallop GI: Right upper quadrant tenderness to palpation Extremities: extremities normal, atraumatic, no cyanosis or edema Neurologic: Alert and oriented X 3, normal strength and tone. Normal symmetric reflexes. Normal coordination and gait  ECOG PERFORMANCE STATUS: 1 - Symptomatic but completely ambulatory  Blood pressure 138/69, pulse 77, temperature 97.6 F (36.4 C), temperature source Oral, resp. rate 18, height 5' 11"  (1.803 m), weight 154 lb 14.4 oz (70.3 kg), SpO2 99 %.  LABORATORY DATA: Lab Results  Component Value Date   WBC 4.3 08/17/2016   HGB 15.5 08/17/2016   HCT 43.4 08/17/2016   MCV 93.1 08/17/2016   PLT 205 08/17/2016      Chemistry      Component Value Date/Time   NA 134 (L) 08/17/2016 1026   K 4.2 08/17/2016 1026   CL 94 (L) 01/07/2016 1844   CO2 25 08/17/2016 1026   BUN 19.5 08/17/2016 1026   CREATININE 0.9 08/17/2016 1026        Component Value Date/Time   CALCIUM 9.6 08/17/2016 1026   ALKPHOS 100 08/17/2016 1026   AST 21 08/17/2016 1026   ALT 16 08/17/2016 1026   BILITOT 0.89 08/17/2016 1026       RADIOGRAPHIC STUDIES: No results found. ASSESSMENT AND PLAN: This is a very pleasant 75 years old white male with:  1)  Stage IV non-small cell lung cancer, adenocarcinoma status post induction chemotherapy with carboplatin and Alimta and currently on maintenance treatment with single agent Alimta status post 8 cycles. He has been observation for the last few months.  He had evidence for disease progression on the left adrenal gland and the patient underwent palliative radiotherapy to the left adrenal gland under the care of Dr. Sondra Come completed on 12/16/2014. He is currently undergoing treatment with immunotherapy with Nivolumab status post 36 cycles and tolerating his treatment fairly well.  I recommended for him to continue his current treatment with immunotherapy as a scheduled with cycle #37 today. The patient would come back for follow-up visit in 2 weeks with the start of cycle #38 after repeating CT scan of the chest, abdomen and pelvis for restaging of his disease.  2) Atrial fibrillation: He'll continue his current medication with Cardizem, digoxin and metoprolol in addition to Eliquis as prescribed by his cardiologist Dr. [name].  3) dizziness: MRI of the brain showed no evidence of metastatic disease to the brain. The patient would see his neurologist for evaluation.  4) back pain: He will continue on Vicodin.   He was advised to call immediately if he has any concerning symptoms in the interval. The patient voices understanding of current disease status and treatment options and is in agreement with the current care plan.  All questions were answered. The patient knows to call the clinic with any problems, questions or concerns. We can certainly see the patient much sooner if  necessary.  Disclaimer: This note was dictated with voice recognition software. Similar sounding words can inadvertently be transcribed and may not be corrected upon review.

## 2016-08-22 ENCOUNTER — Telehealth: Payer: Self-pay | Admitting: Internal Medicine

## 2016-08-22 NOTE — Telephone Encounter (Signed)
Added additional cycles 10/11 and 10/25. Patient to get new schedule at next visit 9/27. Central radiology will call re scan.

## 2016-08-29 ENCOUNTER — Ambulatory Visit (HOSPITAL_COMMUNITY)
Admission: RE | Admit: 2016-08-29 | Discharge: 2016-08-29 | Disposition: A | Discharge status: Home / Self Care | Payer: Medicare Other | Source: Ambulatory Visit | Attending: Internal Medicine | Admitting: Internal Medicine

## 2016-08-29 DIAGNOSIS — Z9221 Personal history of antineoplastic chemotherapy: Secondary | ICD-10-CM | POA: Diagnosis not present

## 2016-08-29 DIAGNOSIS — Z85118 Personal history of other malignant neoplasm of bronchus and lung: Secondary | ICD-10-CM | POA: Diagnosis not present

## 2016-08-29 DIAGNOSIS — N323 Diverticulum of bladder: Secondary | ICD-10-CM | POA: Diagnosis not present

## 2016-08-29 DIAGNOSIS — I700 Atherosclerosis of aorta: Secondary | ICD-10-CM | POA: Diagnosis not present

## 2016-08-29 DIAGNOSIS — J984 Other disorders of lung: Secondary | ICD-10-CM | POA: Diagnosis not present

## 2016-08-29 DIAGNOSIS — C3431 Malignant neoplasm of lower lobe, right bronchus or lung: Secondary | ICD-10-CM | POA: Diagnosis not present

## 2016-08-29 DIAGNOSIS — R918 Other nonspecific abnormal finding of lung field: Secondary | ICD-10-CM | POA: Diagnosis not present

## 2016-08-29 DIAGNOSIS — Z5112 Encounter for antineoplastic immunotherapy: Secondary | ICD-10-CM

## 2016-08-29 DIAGNOSIS — K769 Liver disease, unspecified: Secondary | ICD-10-CM | POA: Diagnosis not present

## 2016-08-29 DIAGNOSIS — J439 Emphysema, unspecified: Secondary | ICD-10-CM | POA: Diagnosis not present

## 2016-08-29 DIAGNOSIS — K7689 Other specified diseases of liver: Secondary | ICD-10-CM | POA: Diagnosis not present

## 2016-08-29 MED ORDER — IOPAMIDOL (ISOVUE-300) INJECTION 61%
100.0000 mL | Freq: Once | INTRAVENOUS | Status: AC | PRN
  Administered 2016-08-29: 100 mL via INTRAVENOUS

## 2016-08-31 ENCOUNTER — Ambulatory Visit (HOSPITAL_BASED_OUTPATIENT_CLINIC_OR_DEPARTMENT_OTHER): Payer: Medicare Other

## 2016-08-31 ENCOUNTER — Encounter: Payer: Self-pay | Admitting: Internal Medicine

## 2016-08-31 ENCOUNTER — Other Ambulatory Visit (HOSPITAL_BASED_OUTPATIENT_CLINIC_OR_DEPARTMENT_OTHER): Payer: Medicare Other

## 2016-08-31 ENCOUNTER — Ambulatory Visit (HOSPITAL_BASED_OUTPATIENT_CLINIC_OR_DEPARTMENT_OTHER): Payer: Medicare Other | Admitting: Internal Medicine

## 2016-08-31 VITALS — BP 139/71 | HR 96 | Temp 98.0°F | Resp 18 | Ht 71.0 in | Wt 155.0 lb

## 2016-08-31 DIAGNOSIS — C7972 Secondary malignant neoplasm of left adrenal gland: Secondary | ICD-10-CM

## 2016-08-31 DIAGNOSIS — Z5112 Encounter for antineoplastic immunotherapy: Secondary | ICD-10-CM | POA: Diagnosis not present

## 2016-08-31 DIAGNOSIS — C3431 Malignant neoplasm of lower lobe, right bronchus or lung: Secondary | ICD-10-CM

## 2016-08-31 DIAGNOSIS — M545 Low back pain: Secondary | ICD-10-CM | POA: Diagnosis not present

## 2016-08-31 DIAGNOSIS — R42 Dizziness and giddiness: Secondary | ICD-10-CM

## 2016-08-31 DIAGNOSIS — I4891 Unspecified atrial fibrillation: Secondary | ICD-10-CM | POA: Diagnosis not present

## 2016-08-31 LAB — COMPREHENSIVE METABOLIC PANEL
ALT: 19 U/L (ref 0–55)
AST: 26 U/L (ref 5–34)
Albumin: 4.1 g/dL (ref 3.5–5.0)
Alkaline Phosphatase: 111 U/L (ref 40–150)
Anion Gap: 13 mEq/L — ABNORMAL HIGH (ref 3–11)
BUN: 16.8 mg/dL (ref 7.0–26.0)
CALCIUM: 9.7 mg/dL (ref 8.4–10.4)
CHLORIDE: 99 meq/L (ref 98–109)
CO2: 22 meq/L (ref 22–29)
CREATININE: 0.8 mg/dL (ref 0.7–1.3)
EGFR: 85 mL/min/{1.73_m2} — ABNORMAL LOW (ref >90)
GLUCOSE: 94 mg/dL (ref 70–140)
Potassium: 4 mEq/L (ref 3.5–5.1)
Sodium: 134 mEq/L — ABNORMAL LOW (ref 136–145)
Total Bilirubin: 0.98 mg/dL (ref 0.20–1.20)
Total Protein: 6.9 g/dL (ref 6.4–8.3)

## 2016-08-31 LAB — CBC WITH DIFFERENTIAL/PLATELET
BASO%: 0.9 % (ref 0.0–2.0)
Basophils Absolute: 0 10*3/uL (ref 0.0–0.1)
EOS%: 5.6 % (ref 0.0–7.0)
Eosinophils Absolute: 0.2 10*3/uL (ref 0.0–0.5)
HEMATOCRIT: 45.4 % (ref 38.4–49.9)
HGB: 15.5 g/dL (ref 13.0–17.1)
LYMPH#: 1 10*3/uL (ref 0.9–3.3)
LYMPH%: 24.3 % (ref 14.0–49.0)
MCH: 32.7 pg (ref 27.2–33.4)
MCHC: 34.2 g/dL (ref 32.0–36.0)
MCV: 95.6 fL (ref 79.3–98.0)
MONO#: 0.6 10*3/uL (ref 0.1–0.9)
MONO%: 15.3 % — ABNORMAL HIGH (ref 0.0–14.0)
NEUT#: 2.1 10*3/uL (ref 1.5–6.5)
NEUT%: 53.9 % (ref 39.0–75.0)
Platelets: 235 10*3/uL (ref 140–400)
RBC: 4.75 10*6/uL (ref 4.20–5.82)
RDW: 13.7 % (ref 11.0–14.6)
WBC: 3.9 10*3/uL — ABNORMAL LOW (ref 4.0–10.3)

## 2016-08-31 MED ORDER — SODIUM CHLORIDE 0.9 % IV SOLN
Freq: Once | INTRAVENOUS | Status: AC
  Administered 2016-08-31: 13:00:00 via INTRAVENOUS

## 2016-08-31 MED ORDER — SODIUM CHLORIDE 0.9 % IV SOLN
240.0000 mg | Freq: Once | INTRAVENOUS | Status: AC
  Administered 2016-08-31: 240 mg via INTRAVENOUS
  Filled 2016-08-31: qty 4

## 2016-08-31 NOTE — Progress Notes (Signed)
[facility] [phone]   Fax:([phone]) [place], [place] [address]  DIAGNOSIS: Stage IV (T1a, N0, M1b) non-small cell lung cancer of the right lower lobe, adenocarcinoma with negative EGFR mutation and negative ALK gene translocation diagnosed in October of 2014.   Molecular profile: Negative for RET, ALK, BRAF, MET, EGFR.  Positive for ERBB2 A775T, [id number], KRAS G13E, MAP2K1 D67N (see full report).   PRIOR THERAPY:  1) Systemic chemotherapy with carboplatin for AUC of 5 and Alimta 500 mg/M2 every 3 weeks, status post 6 cycles with stable disease. First dose on 10/23/2013. 2) Maintenance systemic chemotherapy with single agent Alimta 500 mg/M2 every 3 weeks. Status post 8 cycles.   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 Sondra Come completed on 12/16/2014.  CURRENT THERAPY: Immunotherapy with Nivolumab 3 MG/KG every 2 weeks, status post 37 cycles. First cycle 01/28/2015  CHEMOTHERAPY INTENT: Palliative  CURRENT # OF CHEMOTHERAPY CYCLES: 38 CURRENT ANTIEMETICS: Zofran, Compazine and dexamethasone  CURRENT SMOKING STATUS: Former smoker  ORAL CHEMOTHERAPY AND CONSENT: None  CURRENT BISPHOSPHONATES USE: None  PAIN MANAGEMENT: 0/10  NARCOTICS INDUCED CONSTIPATION: None  LIVING WILL AND CODE STATUS: ?   INTERVAL HISTORY: Anthony Curry 75 y.o. male returns to the clinic today for followup visit accompanied by his wife. The patient has no complaints today except for intermittent dizzy spells. He tolerated the last cycle of his treatment with Nivolumab fairly well. He denied having any significant chest pain, shortness of breath, cough or hemoptysis. He has no fever or chills. He denied having any significant nausea or vomiting, no weight loss or night sweats. He had repeat CT scan of the chest, abdomen and pelvis performed recently and he  is here for evaluation and discussion of his scan results before starting the next cycle of immunotherapy.  MEDICAL HISTORY: Past Medical History:  Diagnosis Date  . 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[place])   . Adrenal mass ([place])   . Alcoholism in remission ([place])   . Anemia of chronic disease   . 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 Anxiety   . BPH (benign prostatic hyperplasia)   . Cancer ([place])    small cell/lung  . Complication of anesthesia 09/08/2015    JITTERY AFTER GALLBLADDER SURGERY  . GERD (gastroesophageal reflux disease)   . Hypertension   . Neoplastic malignant related fatigue    IV tx. every 2 weeks last9-14-16 (Dr. Earlie Server), radiation last tx. 6 months  . 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 Persistent atrial fibrillation ([place])   . Pulmonary nodules   . Radiation 12/02/14-12/16/14   Left adrenal gland 30 Gy in 10 fractions    ALLERGIES:  has No Known Allergies.  MEDICATIONS:  Current Outpatient Prescriptions  Medication Sig Dispense Refill  . ALPRAZolam (XANAX) 0.25 MG tablet Take 0.25 mg by mouth 3 (three) times daily as needed for anxiety or sleep. And also at night for sleep.    [name] apixaban (ELIQUIS) 5 MG TABS tablet Take 5 mg by mouth 2 (two) times daily.    . Ascorbic Acid (VITAMIN C PO) Take 1,000 mg by mouth daily.     . B Complex-C (SUPER B COMPLEX PO) Take 1 each by mouth daily.     . calcium carbonate (OS-CAL) 600 MG TABS Take 600 mg by mouth daily with breakfast.    . demeclocycline (DECLOMYCIN) 150 MG  tablet Take 150 mg by mouth 2 (two) times daily.    [name] diltiazem (CARDIZEM CD) 180 MG 24 hr capsule Take 1 capsule (180 mg total) by mouth daily at 12 noon. 90 capsule 3  . furosemide (LASIX) 40 MG tablet Take 0.5 tablets (20 mg total) by mouth every other day.    [name] HYDROcodone-acetaminophen (NORCO/VICODIN) 5-325 MG tablet Take 1-2 tablets by mouth every 4 (four) hours as needed for moderate pain. 30 tablet 0  . KLOR-CON M20 20 MEQ tablet Take 20  mEq by mouth every morning.     . Lactobacillus (ACIDOPHILUS) CAPS capsule Take 1 capsule by mouth daily.    [name] MAGNESIUM PO Take 400 mg by mouth daily.     . Multiple Vitamin (MULTIVITAMIN WITH MINERALS) TABS Take 1 tablet by mouth daily.    . Nivolumab (OPDIVO IV) Inject as directed every 2 weeks    . Nutritional Supplements (ENSURE COMPLETE PO) Take 1 Can by mouth 2 (two) times daily.    . Omega-3 Fatty Acids (FISH OIL PO) Take 1 tablet by mouth daily.    . pantoprazole (PROTONIX) 40 MG tablet Take 40 mg by mouth every morning.  8  . tamsulosin (FLOMAX) 0.4 MG CAPS capsule Take 0.4 mg by mouth daily at 12 noon.     . thiamine (VITAMIN B-1) 100 MG tablet Take 1 tablet (100 mg total) by mouth daily. 30 tablet 6  . vitamin E 400 UNIT capsule Take 400 Units by mouth daily.     No current facility-administered medications for this visit.    Facility-Administered Medications Ordered in Other Visits  Medication Dose Route Frequency Provider Last Rate Last Dose  . 0.9 %  sodium chloride infusion   Intravenous Once [name], MD        SURGICAL HISTORY:  Past Surgical History:  Procedure Laterality Date  . ANKLE ARTHRODESIS  1995   rt fx-hardware in  . CARDIOVERSION N/A 09/29/2014   Procedure: CARDIOVERSION;  Surgeon: Josue Hector, MD;  Location: [facility] ENDOSCOPY;  Service: Cardiovascular;  Laterality: N/A;  . CHOLECYSTECTOMY  09/08/2015    laproscopic   . CHOLECYSTECTOMY N/A 09/08/2015   Procedure: LAPAROSCOPIC CHOLECYSTECTOMY WITH ATTEMPTED INTRAOPERATIVE CHOLANGIOGRAM ;  Surgeon: Fanny Skates, MD;  Location: [facility];  Service: General;  Laterality: N/A;  . COLONOSCOPY    . ERCP N/A 09/02/2015   Procedure: ENDOSCOPIC RETROGRADE CHOLANGIOPANCREATOGRAPHY (ERCP);  Surgeon: Arta Silence, MD;  Location: [facility];  Service: Endoscopy;  Laterality: N/A;  . ERCP N/A 09/07/2015   Procedure: ENDOSCOPIC RETROGRADE CHOLANGIOPANCREATOGRAPHY (ERCP);  Surgeon: Clarene Essex, MD;  Location: [facility]  ENDOSCOPY;  Service: Endoscopy;  Laterality: N/A;  . EXCISION / CURETTAGE BONE CYST FINGER  2005   rt thumb  . HEMORRHOID SURGERY    . Percutaneous Cholecytostomy tube     IVR insertion 06-13-15(Dr. Vernard Gambles)- 08-28-15 remains in place to drainage bag  . TONSILLECTOMY    . TOTAL SHOULDER ARTHROPLASTY  02/03/2012   Procedure: TOTAL SHOULDER ARTHROPLASTY;  Surgeon: [name], MD;  Location: [facility];  Service: Orthopedics;  Laterality: Left;    REVIEW OF SYSTEMS:  Constitutional: negative Eyes: negative Ears, nose, mouth, throat, and face: negative Respiratory: negative Cardiovascular: negative Gastrointestinal: negative Genitourinary:negative Integument/breast: negative Hematologic/lymphatic: negative Musculoskeletal:negative Neurological: positive for dizziness Behavioral/Psych: negative Endocrine: negative Allergic/Immunologic: negative   PHYSICAL EXAMINATION: General appearance: alert, cooperative, fatigued and no distress Head: Normocephalic, without obvious abnormality, atraumatic Neck: no adenopathy, no JVD, supple, symmetrical, trachea midline and  thyroid not enlarged, symmetric, no tenderness/mass/nodules Lymph nodes: Cervical, supraclavicular, and axillary nodes normal. Resp: clear to auscultation bilaterally Back: symmetric, no curvature. ROM normal. No CVA tenderness. Cardio: regular rate and rhythm, S1, S2 normal, no murmur, click, rub or gallop GI: Right upper quadrant tenderness to palpation Extremities: extremities normal, atraumatic, no cyanosis or edema Neurologic: Alert and oriented X 3, normal strength and tone. Normal symmetric reflexes. Normal coordination and gait  ECOG PERFORMANCE STATUS: 1 - Symptomatic but completely ambulatory  Blood pressure 139/71, pulse 96, temperature 98 F (36.7 C), temperature source Oral, resp. rate 18, height 5\' 11"  (1.803 m), weight 155 lb (70.3 kg), SpO2 97 %.  LABORATORY DATA: Lab Results  Component  Value Date   WBC 4.3 08/17/2016   HGB 15.5 08/17/2016   HCT 43.4 08/17/2016   MCV 93.1 08/17/2016   PLT 205 08/17/2016      Chemistry      Component Value Date/Time   NA 134 (L) 08/17/2016 1026   K 4.2 08/17/2016 1026   CL 94 (L) 01/07/2016 1844   CO2 25 08/17/2016 1026   BUN 19.5 08/17/2016 1026   CREATININE 0.9 08/17/2016 1026      Component Value Date/Time   CALCIUM 9.6 08/17/2016 1026   ALKPHOS 100 08/17/2016 1026   AST 21 08/17/2016 1026   ALT 16 08/17/2016 1026   BILITOT 0.89 08/17/2016 1026       RADIOGRAPHIC STUDIES: Ct Chest W Contrast  Result Date: 08/29/2016 CLINICAL DATA:  Restaging lung cancer. Right lower lobe cancer diagnosed October 2014. Completed radiation and chemotherapy. EXAM: CT CHEST, ABDOMEN, AND PELVIS WITH CONTRAST TECHNIQUE: Multidetector CT imaging of the chest, abdomen and pelvis was performed following the standard protocol during bolus administration of intravenous contrast. CONTRAST:  November 2014 ISOVUE-300 IOPAMIDOL (ISOVUE-300) INJECTION 61% COMPARISON:  06/06/2016 FINDINGS: CT CHEST FINDINGS Chest wall: Asymmetric right-sided gynecomastia is again demonstrated but is stable. No chest wall mass, supraclavicular or axillary adenopathy. The thyroid gland is grossly normal. Small calcifications are noted. Cardiovascular: The heart is normal in size. No pericardial effusion. Small amount of loculated pleural fluid noted in the anterior inferior aspect. Stable atherosclerotic calcifications involving the thoracic aorta and branch vessels including the coronary arteries. The pulmonary arteries are grossly normal. Mediastinum/Nodes: No mediastinal or hilar mass or adenopathy. Stable small scattered lymph nodes. The esophagus is grossly normal. Lungs/Pleura: Progressive soft tissue thickening with more discrete nodularity involving the left upper lobe surgical site. The lesion is now more rounded and measures 12 x 12 mm and is very suspicious for recurrent cancer.  PET-CT suggested for further evaluation and staging purposes. Stable underline emphysematous changes and areas of pulmonary scarring. Vague ground-glass opacification in both upper lobes is unchanged. Other areas of chronic patchy ground-glass opacity in both lungs without significant change. The largest area in the right lung measures approximately 26 x 17 mm on image number 68 and previously measured 28.5 x 18 mm. The largest lesion the left lung on image number 60 measures approximately 27 x 19 mm and previously measured 28 x 25 mm. No infiltrates, effusions or edema. No interstitial lung disease. Stable areas of mild bronchiectasis. Stable dense pleural calcification at the left lung base. Musculoskeletal: No significant bony findings. Stable surgical changes from a left thoracotomy and stable remote right-sided rib fractures. CT ABDOMEN PELVIS FINDINGS Hepatobiliary: Stable tiny hepatic cysts and stable 6 mm enhancing nodule in the right hepatic lobe on image number 58. This could be a flash filling hemangioma  or a small vascular shunt. No worrisome hepatic lesions. No intrahepatic biliary dilatation. The gallbladder is surgically absent. Stable mild pneumobilia. No common bile duct dilatation. Pancreas: No mass, inflammation or duct dilatation. Spleen: Normal size.  No focal lesions. Adrenals/Urinary Tract: The adrenal glands and kidneys are unremarkable and stable. There are small cysts and remote scarring changes. Stomach/Bowel: The stomach, duodenum, small bowel and colon are unremarkable. No inflammatory changes, mass lesions or obstructive findings. Moderate descending and sigmoid diverticulosis. Vascular/Lymphatic: Stable advanced atherosclerotic calcifications involving the aorta and branch vessels. No focal aneurysm or dissection. The major venous structures are patent. Small scattered mesenteric and retroperitoneal lymph nodes but no mass or adenopathy. Reproductive: The prostate gland and seminal  vesicles are unremarkable. Other: Stable the bladder diverticuli, cellules and trabeculation. No mass or asymmetric wall thickening. Musculoskeletal: No significant bony findings. Stable mild osteoporosis and degenerative changes in the spine. No worrisome bone lesions to suggest metastatic disease. IMPRESSION: 1. Enlarging left upper lobe pulmonary lesion consistent with recurrent tumor. PET-CT suggested for further evaluation and staging. 2. Several other ground-glass nodules are noted in both lungs but appears stable. 3. Stable emphysematous changes and pulmonary scarring. 4. No mediastinal or hilar mass or adenopathy. 5. Stable small hepatic lesions. No findings worrisome for metastatic disease. 6. Stable bladder diverticuli. 7. Stable advanced atherosclerotic calcifications involving the thoracic and abdominal aortas and branch vessels. Electronically Signed   By: Marijo Sanes M.D.   On: 08/29/2016 14:46   Ct Abdomen Pelvis W Contrast  Result Date: 08/29/2016 CLINICAL DATA:  Restaging lung cancer. Right lower lobe cancer diagnosed October 2014. Completed radiation and chemotherapy. EXAM: CT CHEST, ABDOMEN, AND PELVIS WITH CONTRAST TECHNIQUE: Multidetector CT imaging of the chest, abdomen and pelvis was performed following the standard protocol during bolus administration of intravenous contrast. CONTRAST:  121m ISOVUE-300 IOPAMIDOL (ISOVUE-300) INJECTION 61% COMPARISON:  06/06/2016 FINDINGS: CT CHEST FINDINGS Chest wall: Asymmetric right-sided gynecomastia is again demonstrated but is stable. No chest wall mass, supraclavicular or axillary adenopathy. The thyroid gland is grossly normal. Small calcifications are noted. Cardiovascular: The heart is normal in size. No pericardial effusion. Small amount of loculated pleural fluid noted in the anterior inferior aspect. Stable atherosclerotic calcifications involving the thoracic aorta and branch vessels including the coronary arteries. The pulmonary arteries  are grossly normal. Mediastinum/Nodes: No mediastinal or hilar mass or adenopathy. Stable small scattered lymph nodes. The esophagus is grossly normal. Lungs/Pleura: Progressive soft tissue thickening with more discrete nodularity involving the left upper lobe surgical site. The lesion is now more rounded and measures 12 x 12 mm and is very suspicious for recurrent cancer. PET-CT suggested for further evaluation and staging purposes. Stable underline emphysematous changes and areas of pulmonary scarring. Vague ground-glass opacification in both upper lobes is unchanged. Other areas of chronic patchy ground-glass opacity in both lungs without significant change. The largest area in the right lung measures approximately 26 x 17 mm on image number 68 and previously measured 28.5 x 18 mm. The largest lesion the left lung on image number 60 measures approximately 27 x 19 mm and previously measured 28 x 25 mm. No infiltrates, effusions or edema. No interstitial lung disease. Stable areas of mild bronchiectasis. Stable dense pleural calcification at the left lung base. Musculoskeletal: No significant bony findings. Stable surgical changes from a left thoracotomy and stable remote right-sided rib fractures. CT ABDOMEN PELVIS FINDINGS Hepatobiliary: Stable tiny hepatic cysts and stable 6 mm enhancing nodule in the right hepatic lobe on image number  58. This could be a flash filling hemangioma or a small vascular shunt. No worrisome hepatic lesions. No intrahepatic biliary dilatation. The gallbladder is surgically absent. Stable mild pneumobilia. No common bile duct dilatation. Pancreas: No mass, inflammation or duct dilatation. Spleen: Normal size.  No focal lesions. Adrenals/Urinary Tract: The adrenal glands and kidneys are unremarkable and stable. There are small cysts and remote scarring changes. Stomach/Bowel: The stomach, duodenum, small bowel and colon are unremarkable. No inflammatory changes, mass lesions or  obstructive findings. Moderate descending and sigmoid diverticulosis. Vascular/Lymphatic: Stable advanced atherosclerotic calcifications involving the aorta and branch vessels. No focal aneurysm or dissection. The major venous structures are patent. Small scattered mesenteric and retroperitoneal lymph nodes but no mass or adenopathy. Reproductive: The prostate gland and seminal vesicles are unremarkable. Other: Stable the bladder diverticuli, cellules and trabeculation. No mass or asymmetric wall thickening. Musculoskeletal: No significant bony findings. Stable mild osteoporosis and degenerative changes in the spine. No worrisome bone lesions to suggest metastatic disease. IMPRESSION: 1. Enlarging left upper lobe pulmonary lesion consistent with recurrent tumor. PET-CT suggested for further evaluation and staging. 2. Several other ground-glass nodules are noted in both lungs but appears stable. 3. Stable emphysematous changes and pulmonary scarring. 4. No mediastinal or hilar mass or adenopathy. 5. Stable small hepatic lesions. No findings worrisome for metastatic disease. 6. Stable bladder diverticuli. 7. Stable advanced atherosclerotic calcifications involving the thoracic and abdominal aortas and branch vessels. Electronically Signed   By: Marijo Sanes M.D.   On: 08/29/2016 14:46   ASSESSMENT AND PLAN: This is a very pleasant 75 years old white male with:  1)  Stage IV non-small cell lung cancer, adenocarcinoma status post induction chemotherapy with carboplatin and Alimta and currently on maintenance treatment with single agent Alimta status post 8 cycles. He has been observation for the last few months.  He had evidence for disease progression on the left adrenal gland and the patient underwent palliative radiotherapy to the left adrenal gland under the care of Dr. Sondra Come completed on 12/16/2014. He is currently undergoing treatment with immunotherapy with Nivolumab status post 37 cycles and tolerating  his treatment fairly well.  The recent CT scan of the chest, abdomen and pelvis showed no evidence for disease progression except for suspicious enlarging left upper lobe lung nodule measuring 1.2 cm in size. I discussed the scan results and showed the images to the patient today. I recommended for him to continue with his current treatment with Nivolumab as a scheduled. I will continue to monitor this nodule on upcoming scan and if it continues to increase in size, I will refer the patient to radiation oncology for consideration of stereotactic radiotherapy to this lesion. He will proceed with cycle #38 of his immunotherapy today. The patient would come back for follow-up visit in 2 weeks with the start of cycle #39.  2) Atrial fibrillation: He'll continue his current medication with Cardizem, digoxin and metoprolol in addition to Eliquis as prescribed by his cardiologist Dr. [name].  3) dizziness: MRI of the brain showed no evidence of metastatic disease to the brain. The patient would see his neurologist for evaluation.  4) back pain: He will continue on Vicodin.   He was advised to call immediately if he has any concerning symptoms in the interval. The patient voices understanding of current disease status and treatment options and is in agreement with the current care plan.  All questions were answered. The patient knows to call the clinic with any problems, questions or  concerns. We can certainly see the patient much sooner if necessary.  Disclaimer: This note was dictated with voice recognition software. Similar sounding words can inadvertently be transcribed and may not be corrected upon review.

## 2016-08-31 NOTE — Patient Instructions (Signed)
[facility] Discharge Instructions for Patients Receiving Chemotherapy  Today you received the following chemotherapy agents:  Opdivo  To help prevent nausea and vomiting after your treatment, we encourage you to take your nausea medication as prescribed.   If you develop nausea and vomiting that is not controlled by your nausea medication, call the clinic.   BELOW ARE SYMPTOMS THAT SHOULD BE REPORTED IMMEDIATELY:  *FEVER GREATER THAN 100.5 F  *CHILLS WITH OR WITHOUT FEVER  NAUSEA AND VOMITING THAT IS NOT CONTROLLED WITH YOUR NAUSEA MEDICATION  *UNUSUAL SHORTNESS OF BREATH  *UNUSUAL BRUISING OR BLEEDING  TENDERNESS IN MOUTH AND THROAT WITH OR WITHOUT PRESENCE OF ULCERS  *URINARY PROBLEMS  *BOWEL PROBLEMS  UNUSUAL RASH Items with * indicate a potential emergency and should be followed up as soon as possible.  Feel free to call the clinic you have any questions or concerns. The clinic phone number is [phone].  Please show the CHEMO ALERT CARD at check-in to the Emergency Department and triage nurse.   

## 2016-09-04 ENCOUNTER — Telehealth: Payer: Self-pay | Admitting: Internal Medicine

## 2016-09-04 NOTE — Telephone Encounter (Signed)
Added additional cycles for 11/8 and 11/22. Patient to get new schedule at 10/11 visit.

## 2016-09-14 ENCOUNTER — Other Ambulatory Visit: Payer: Self-pay | Admitting: Internal Medicine

## 2016-09-14 ENCOUNTER — Ambulatory Visit (HOSPITAL_BASED_OUTPATIENT_CLINIC_OR_DEPARTMENT_OTHER): Payer: Medicare Other | Admitting: Nurse Practitioner

## 2016-09-14 ENCOUNTER — Ambulatory Visit (HOSPITAL_BASED_OUTPATIENT_CLINIC_OR_DEPARTMENT_OTHER): Payer: Medicare Other

## 2016-09-14 ENCOUNTER — Other Ambulatory Visit (HOSPITAL_COMMUNITY)
Admission: RE | Admit: 2016-09-14 | Discharge: 2016-09-14 | Disposition: A | Discharge status: Home / Self Care | Payer: Medicare Other | Source: Ambulatory Visit | Attending: Internal Medicine | Admitting: Internal Medicine

## 2016-09-14 ENCOUNTER — Other Ambulatory Visit (HOSPITAL_BASED_OUTPATIENT_CLINIC_OR_DEPARTMENT_OTHER): Payer: Medicare Other

## 2016-09-14 DIAGNOSIS — M545 Low back pain: Secondary | ICD-10-CM | POA: Diagnosis not present

## 2016-09-14 DIAGNOSIS — C7972 Secondary malignant neoplasm of left adrenal gland: Secondary | ICD-10-CM

## 2016-09-14 DIAGNOSIS — C3431 Malignant neoplasm of lower lobe, right bronchus or lung: Secondary | ICD-10-CM

## 2016-09-14 DIAGNOSIS — Z5112 Encounter for antineoplastic immunotherapy: Secondary | ICD-10-CM | POA: Diagnosis present

## 2016-09-14 DIAGNOSIS — I4891 Unspecified atrial fibrillation: Secondary | ICD-10-CM | POA: Diagnosis not present

## 2016-09-14 DIAGNOSIS — G893 Neoplasm related pain (acute) (chronic): Secondary | ICD-10-CM

## 2016-09-14 DIAGNOSIS — C3490 Malignant neoplasm of unspecified part of unspecified bronchus or lung: Secondary | ICD-10-CM | POA: Diagnosis not present

## 2016-09-14 DIAGNOSIS — Z23 Encounter for immunization: Secondary | ICD-10-CM | POA: Diagnosis not present

## 2016-09-14 LAB — CBC WITH DIFFERENTIAL/PLATELET
BASO%: 0.9 % (ref 0.0–2.0)
BASOS ABS: 0 10*3/uL (ref 0.0–0.1)
EOS ABS: 0.2 10*3/uL (ref 0.0–0.5)
EOS%: 4.2 % (ref 0.0–7.0)
HEMATOCRIT: 43.8 % (ref 38.4–49.9)
HGB: 14.8 g/dL (ref 13.0–17.1)
LYMPH#: 1.3 10*3/uL (ref 0.9–3.3)
LYMPH%: 27.2 % (ref 14.0–49.0)
MCH: 32.3 pg (ref 27.2–33.4)
MCHC: 33.8 g/dL (ref 32.0–36.0)
MCV: 95.5 fL (ref 79.3–98.0)
MONO#: 0.6 10*3/uL (ref 0.1–0.9)
MONO%: 11.7 % (ref 0.0–14.0)
NEUT#: 2.7 10*3/uL (ref 1.5–6.5)
NEUT%: 56 % (ref 39.0–75.0)
PLATELETS: 251 10*3/uL (ref 140–400)
RBC: 4.59 10*6/uL (ref 4.20–5.82)
RDW: 13.8 % (ref 11.0–14.6)
WBC: 4.7 10*3/uL (ref 4.0–10.3)

## 2016-09-14 LAB — COMPREHENSIVE METABOLIC PANEL
ALT: 16 U/L — ABNORMAL LOW (ref 17–63)
AST: 26 U/L (ref 15–41)
Albumin: 4.1 g/dL (ref 3.5–5.0)
Alkaline Phosphatase: 83 U/L (ref 38–126)
Anion gap: 9 (ref 5–15)
BILIRUBIN TOTAL: 0.8 mg/dL (ref 0.3–1.2)
BUN: 15 mg/dL (ref 6–20)
CO2: 23 mmol/L (ref 22–32)
CREATININE: 0.84 mg/dL (ref 0.61–1.24)
Calcium: 9.3 mg/dL (ref 8.9–10.3)
Chloride: 101 mmol/L (ref 101–111)
Glucose, Bld: 98 mg/dL (ref 65–99)
POTASSIUM: 4.3 mmol/L (ref 3.5–5.1)
Sodium: 133 mmol/L — ABNORMAL LOW (ref 135–145)
TOTAL PROTEIN: 6.6 g/dL (ref 6.5–8.1)

## 2016-09-14 MED ORDER — SODIUM CHLORIDE 0.9 % IV SOLN
Freq: Once | INTRAVENOUS | Status: AC
  Administered 2016-09-14: 15:00:00 via INTRAVENOUS

## 2016-09-14 MED ORDER — HYDROCODONE-ACETAMINOPHEN 5-325 MG PO TABS: 1.0000 | ORAL_TABLET | ORAL | 0 refills | Status: DC | PRN

## 2016-09-14 MED ORDER — INFLUENZA VAC SPLIT QUAD 0.5 ML IM SUSY
0.5000 mL | PREFILLED_SYRINGE | Freq: Once | INTRAMUSCULAR | Status: AC
  Administered 2016-09-14: 0.5 mL via INTRAMUSCULAR
  Filled 2016-09-14: qty 0.5

## 2016-09-14 MED ORDER — SODIUM CHLORIDE 0.9 % IV SOLN
240.0000 mg | Freq: Once | INTRAVENOUS | Status: AC
  Administered 2016-09-14: 240 mg via INTRAVENOUS
  Filled 2016-09-14: qty 20

## 2016-09-14 NOTE — Patient Instructions (Signed)
[facility] Discharge Instructions for Patients Receiving Chemotherapy  Today you received the following chemotherapy agents:  Opdivo  To help prevent nausea and vomiting after your treatment, we encourage you to take your nausea medication as prescribed.   If you develop nausea and vomiting that is not controlled by your nausea medication, call the clinic.   BELOW ARE SYMPTOMS THAT SHOULD BE REPORTED IMMEDIATELY:  *FEVER GREATER THAN 100.5 F  *CHILLS WITH OR WITHOUT FEVER  NAUSEA AND VOMITING THAT IS NOT CONTROLLED WITH YOUR NAUSEA MEDICATION  *UNUSUAL SHORTNESS OF BREATH  *UNUSUAL BRUISING OR BLEEDING  TENDERNESS IN MOUTH AND THROAT WITH OR WITHOUT PRESENCE OF ULCERS  *URINARY PROBLEMS  *BOWEL PROBLEMS  UNUSUAL RASH Items with * indicate a potential emergency and should be followed up as soon as possible.  Feel free to call the clinic you have any questions or concerns. The clinic phone number is [phone].  Please show the CHEMO ALERT CARD at check-in to the Emergency Department and triage nurse.   

## 2016-09-14 NOTE — Progress Notes (Signed)
  [facility] OFFICE PROGRESS NOTE   DIAGNOSIS: Stage IV (T1a, N0, M1b) non-small cell lung cancer of the right lower lobe, adenocarcinoma with negative EGFR mutation and negative ALK gene translocation diagnosed in October of 2014.   Molecular profile: Negative for RET, ALK, BRAF, MET, EGFR.  Positive for ERBB2 A775T, [id number], KRAS G13E, MAP2K1 D67N (see full report).   PRIOR THERAPY:  1) Systemic chemotherapy with carboplatin for AUC of 5 and Alimta 500 mg/M2 every 3 weeks, status post 6 cycles with stable disease. First dose on 10/23/2013. 2) Maintenance systemic chemotherapy with single agent Alimta 500 mg/M2 every 3 weeks. Status post 8 cycles.   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 Sondra Come completed on 12/16/2014.  CURRENT THERAPY: Immunotherapy with Nivolumab 3 MG/KG every 2 weeks, status post 38 cycles. First cycle 01/28/2015   INTERVAL HISTORY:   Anthony Curry returns as scheduled. He completed cycle 38 Novolumab on 08/31/2016. He denies nausea/vomiting. He has an occasional sore related to his dentures. Periodic loose stools. No shortness of breath, cough or fever. Appetite varies. Stable low back pain. No rash.  Objective:  Vital signs in last 24 hours:  Blood pressure 138/68, pulse 71, temperature 97.6 F (36.4 C), temperature source Oral, resp. rate 18, height _0  (1.803 m), weight 154 lb 12.8 oz (70.2 kg), SpO2 97 %.    HEENT: No thrush or ulcers. Resp: Distant breath sounds. Cardio: Regular rate and rhythm. GI: Abdomen soft and nontender. No hepatomegaly. Vascular: No edema. Right lower leg/ankle larger than the left lower leg/ankle. Skin: No rash.    Lab Results:  Lab Results  Component Value Date   WBC 4.7 09/14/2016   HGB 14.8 09/14/2016   HCT 43.8 09/14/2016   MCV 95.5 09/14/2016   PLT 251 09/14/2016   NEUTROABS 2.7 09/14/2016    Imaging:  No results found.  Medications: I have  reviewed the patient's current medications.  Assessment/Plan: 1. Stage IV non-small cell lung cancer, adenocarcinoma currently on active treatment with Nivolumab with 38 cycles completed to date.  2. Atrial fibrillation. On Cardizem, digoxin, metoprolol, Eliquis 3. Dizziness, MRI brain 07/06/2016 negative for metastatic disease 4. Back pain, on Vicodin   Disposition:Anthony Curry appears stable. He has completed 38 cycles of nivolumab. Plan to proceed was cycle 39 today as scheduled. He will return for a follow-up visit in 2 weeks. He will contact the office in the interim with any problems.  Plan reviewed with Dr. [name].  Ned Card ANP/GNP-BC   09/14/2016  2:13 PM

## 2016-09-26 DIAGNOSIS — H353221 Exudative age-related macular degeneration, left eye, with active choroidal neovascularization: Secondary | ICD-10-CM | POA: Diagnosis not present

## 2016-09-28 ENCOUNTER — Ambulatory Visit (HOSPITAL_BASED_OUTPATIENT_CLINIC_OR_DEPARTMENT_OTHER): Payer: Medicare Other

## 2016-09-28 ENCOUNTER — Ambulatory Visit (HOSPITAL_BASED_OUTPATIENT_CLINIC_OR_DEPARTMENT_OTHER): Payer: Medicare Other | Admitting: Internal Medicine

## 2016-09-28 ENCOUNTER — Encounter: Payer: Self-pay | Admitting: Internal Medicine

## 2016-09-28 ENCOUNTER — Other Ambulatory Visit (HOSPITAL_BASED_OUTPATIENT_CLINIC_OR_DEPARTMENT_OTHER): Payer: Medicare Other

## 2016-09-28 VITALS — BP 134/64 | HR 71 | Temp 97.8°F | Resp 16 | Ht 71.0 in | Wt 156.1 lb

## 2016-09-28 DIAGNOSIS — C3431 Malignant neoplasm of lower lobe, right bronchus or lung: Secondary | ICD-10-CM | POA: Diagnosis not present

## 2016-09-28 DIAGNOSIS — Z5112 Encounter for antineoplastic immunotherapy: Secondary | ICD-10-CM

## 2016-09-28 DIAGNOSIS — H539 Unspecified visual disturbance: Secondary | ICD-10-CM | POA: Diagnosis not present

## 2016-09-28 DIAGNOSIS — M545 Low back pain: Secondary | ICD-10-CM | POA: Diagnosis not present

## 2016-09-28 DIAGNOSIS — I4891 Unspecified atrial fibrillation: Secondary | ICD-10-CM | POA: Diagnosis not present

## 2016-09-28 DIAGNOSIS — C7972 Secondary malignant neoplasm of left adrenal gland: Secondary | ICD-10-CM

## 2016-09-28 LAB — CBC WITH DIFFERENTIAL/PLATELET
BASO%: 0.5 % (ref 0.0–2.0)
Basophils Absolute: 0 10*3/uL (ref 0.0–0.1)
EOS%: 4.2 % (ref 0.0–7.0)
Eosinophils Absolute: 0.2 10*3/uL (ref 0.0–0.5)
HCT: 43.1 % (ref 38.4–49.9)
HGB: 14.6 g/dL (ref 13.0–17.1)
LYMPH%: 25.7 % (ref 14.0–49.0)
MCH: 32.6 pg (ref 27.2–33.4)
MCHC: 34 g/dL (ref 32.0–36.0)
MCV: 95.8 fL (ref 79.3–98.0)
MONO#: 0.5 10*3/uL (ref 0.1–0.9)
MONO%: 11.7 % (ref 0.0–14.0)
NEUT%: 57.9 % (ref 39.0–75.0)
NEUTROS ABS: 2.7 10*3/uL (ref 1.5–6.5)
Platelets: 246 10*3/uL (ref 140–400)
RBC: 4.49 10*6/uL (ref 4.20–5.82)
RDW: 13.5 % (ref 11.0–14.6)
WBC: 4.7 10*3/uL (ref 4.0–10.3)
lymph#: 1.2 10*3/uL (ref 0.9–3.3)

## 2016-09-28 LAB — COMPREHENSIVE METABOLIC PANEL
ALT: 29 U/L (ref 0–55)
ANION GAP: 9 meq/L (ref 3–11)
AST: 31 U/L (ref 5–34)
Albumin: 3.9 g/dL (ref 3.5–5.0)
Alkaline Phosphatase: 101 U/L (ref 40–150)
BILIRUBIN TOTAL: 0.94 mg/dL (ref 0.20–1.20)
BUN: 13.2 mg/dL (ref 7.0–26.0)
CHLORIDE: 100 meq/L (ref 98–109)
CO2: 25 meq/L (ref 22–29)
CREATININE: 0.8 mg/dL (ref 0.7–1.3)
Calcium: 9.4 mg/dL (ref 8.4–10.4)
EGFR: 85 mL/min/{1.73_m2} — ABNORMAL LOW (ref >90)
GLUCOSE: 108 mg/dL (ref 70–140)
Potassium: 4 mEq/L (ref 3.5–5.1)
SODIUM: 134 meq/L — AB (ref 136–145)
TOTAL PROTEIN: 6.6 g/dL (ref 6.4–8.3)

## 2016-09-28 MED ORDER — SODIUM CHLORIDE 0.9 % IV SOLN
Freq: Once | INTRAVENOUS | Status: AC
  Administered 2016-09-28: 14:00:00 via INTRAVENOUS

## 2016-09-28 MED ORDER — SODIUM CHLORIDE 0.9 % IV SOLN
240.0000 mg | Freq: Once | INTRAVENOUS | Status: AC
  Administered 2016-09-28: 240 mg via INTRAVENOUS
  Filled 2016-09-28: qty 20

## 2016-09-28 NOTE — Patient Instructions (Signed)
[facility] Discharge Instructions for Patients Receiving Chemotherapy  Today you received the following chemotherapy agents Opdivo.  To help prevent nausea and vomiting after your treatment, we encourage you to take your nausea medication as directed.   If you develop nausea and vomiting that is not controlled by your nausea medication, call the clinic.   BELOW ARE SYMPTOMS THAT SHOULD BE REPORTED IMMEDIATELY:  *FEVER GREATER THAN 100.5 F  *CHILLS WITH OR WITHOUT FEVER  NAUSEA AND VOMITING THAT IS NOT CONTROLLED WITH YOUR NAUSEA MEDICATION  *UNUSUAL SHORTNESS OF BREATH  *UNUSUAL BRUISING OR BLEEDING  TENDERNESS IN MOUTH AND THROAT WITH OR WITHOUT PRESENCE OF ULCERS  *URINARY PROBLEMS  *BOWEL PROBLEMS  UNUSUAL RASH Items with * indicate a potential emergency and should be followed up as soon as possible.  Feel free to call the clinic you have any questions or concerns. The clinic phone number is [phone].  Please show the CHEMO ALERT CARD at check-in to the Emergency Department and triage nurse.    

## 2016-09-28 NOTE — Progress Notes (Signed)
[facility] [phone]   Fax:([phone]) [place], [place] [address]  DIAGNOSIS: Stage IV (T1a, N0, M1b) non-small cell lung cancer of the right lower lobe, adenocarcinoma with negative EGFR mutation and negative ALK gene translocation diagnosed in October of 2014.   Molecular profile: Negative for RET, ALK, BRAF, MET, EGFR.  Positive for ERBB2 A775T, [id number], KRAS G13E, MAP2K1 D67N (see full report).   PRIOR THERAPY:  1) Systemic chemotherapy with carboplatin for AUC of 5 and Alimta 500 mg/M2 every 3 weeks, status post 6 cycles with stable disease. First dose on 10/23/2013. 2) Maintenance systemic chemotherapy with single agent Alimta 500 mg/M2 every 3 weeks. Status post 8 cycles.   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 Sondra Come completed on 12/16/2014.  CURRENT THERAPY: Immunotherapy with Nivolumab 3 MG/KG every 2 weeks, status post 39 cycles. First cycle 01/28/2015  CHEMOTHERAPY INTENT: Palliative  CURRENT # OF CHEMOTHERAPY CYCLES: 40 CURRENT ANTIEMETICS: Zofran, Compazine and dexamethasone  CURRENT SMOKING STATUS: Former smoker  ORAL CHEMOTHERAPY AND CONSENT: None  CURRENT BISPHOSPHONATES USE: None  PAIN MANAGEMENT: 0/10  NARCOTICS INDUCED CONSTIPATION: None  LIVING WILL AND CODE STATUS: ?   INTERVAL HISTORY: Anthony Curry 75 y.o. male returns to the clinic today for followup visit accompanied by his wife. The patient has no complaints today except for recent visual disturbance. He was seen by his ophthalmologist and was found to have questionable retinal hemorrhage secondary to treatment with Eliquis. He was given 2 injection likely Avastin. He tolerated the last cycle of his treatment with Nivolumab fairly well. He denied having any significant chest pain, shortness of breath, cough or hemoptysis. He has no fever or chills.  He denied having any significant nausea or vomiting, no weight loss or night sweats. He just came back from vacation in [place]. He is here today to start cycle #40 of his treatment.  MEDICAL HISTORY: Past Medical History:  Diagnosis Date  . 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[place])   . Adrenal mass ([place])   . Alcoholism in remission ([place])   . Anemia of chronic disease   . 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 Anxiety   . BPH (benign prostatic hyperplasia)   . Cancer ([place])    small cell/lung  . Complication of anesthesia 09/08/2015    JITTERY AFTER GALLBLADDER SURGERY  . GERD (gastroesophageal reflux disease)   . Hypertension   . Neoplastic malignant related fatigue    IV tx. every 2 weeks last9-14-16 (Dr. Earlie Server), radiation last tx. 6 months  . 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 Persistent atrial fibrillation ([name])   . Pulmonary nodules   . Radiation 12/02/14-12/16/14   Left adrenal gland 30 Gy in 10 fractions    ALLERGIES:  has No Known Allergies.  MEDICATIONS:  Current Outpatient Prescriptions  Medication Sig Dispense Refill  . ALPRAZolam (XANAX) 0.25 MG tablet Take 0.25 mg by mouth 3 (three) times daily as needed for anxiety or sleep. And also at night for sleep.    [name] apixaban (ELIQUIS) 5 MG TABS tablet Take 5 mg by mouth 2 (two) times daily.    . Ascorbic Acid (VITAMIN C PO) Take 1,000 mg by mouth daily.     . B Complex-C (SUPER B COMPLEX PO) Take 1 each by mouth daily.     . calcium carbonate (OS-CAL) 600 MG TABS  Take 600 mg by mouth daily with breakfast.    . demeclocycline (DECLOMYCIN) 150 MG tablet Take 150 mg by mouth 2 (two) times daily.    [name] diltiazem (CARDIZEM CD) 180 MG 24 hr capsule Take 1 capsule (180 mg total) by mouth daily at 12 noon. 90 capsule 3  . furosemide (LASIX) 40 MG tablet Take 0.5 tablets (20 mg total) by mouth every other day.    [name] HYDROcodone-acetaminophen (NORCO/VICODIN) 5-325 MG tablet Take 1-2 tablets by mouth every  4 (four) hours as needed for moderate pain. 30 tablet 0  . KLOR-CON M20 20 MEQ tablet Take 20 mEq by mouth every morning.     . Lactobacillus (ACIDOPHILUS) CAPS capsule Take 1 capsule by mouth daily.    [name] MAGNESIUM PO Take 400 mg by mouth daily.     . Multiple Vitamin (MULTIVITAMIN WITH MINERALS) TABS Take 1 tablet by mouth daily.    . Nivolumab (OPDIVO IV) Inject as directed every 2 weeks    . Nutritional Supplements (ENSURE COMPLETE PO) Take 1 Can by mouth 2 (two) times daily.    . Omega-3 Fatty Acids (FISH OIL PO) Take 1 tablet by mouth daily.    . pantoprazole (PROTONIX) 40 MG tablet Take 40 mg by mouth every morning.  8  . tamsulosin (FLOMAX) 0.4 MG CAPS capsule Take 0.4 mg by mouth daily at 12 noon.     . thiamine (VITAMIN B-1) 100 MG tablet Take 1 tablet (100 mg total) by mouth daily. 30 tablet 6  . vitamin E 400 UNIT capsule Take 400 Units by mouth daily.     No current facility-administered medications for this visit.    Facility-Administered Medications Ordered in Other Visits  Medication Dose Route Frequency Provider Last Rate Last Dose  . 0.9 %  sodium chloride infusion   Intravenous Once [name], MD        SURGICAL HISTORY:  Past Surgical History:  Procedure Laterality Date  . ANKLE ARTHRODESIS  1995   rt fx-hardware in  . CARDIOVERSION N/A 09/29/2014   Procedure: CARDIOVERSION;  Surgeon: Josue Hector, MD;  Location: [facility];  Service: Cardiovascular;  Laterality: N/A;  . CHOLECYSTECTOMY  09/08/2015    laproscopic   . CHOLECYSTECTOMY N/A 09/08/2015   Procedure: LAPAROSCOPIC CHOLECYSTECTOMY WITH ATTEMPTED INTRAOPERATIVE CHOLANGIOGRAM ;  Surgeon: Fanny Skates, MD;  Location: [facility];  Service: General;  Laterality: N/A;  . COLONOSCOPY    . ERCP N/A 09/02/2015   Procedure: ENDOSCOPIC RETROGRADE CHOLANGIOPANCREATOGRAPHY (ERCP);  Surgeon: Arta Silence, MD;  Location: [facility];  Service: Endoscopy;  Laterality: N/A;  . ERCP N/A 09/07/2015   Procedure:  ENDOSCOPIC RETROGRADE CHOLANGIOPANCREATOGRAPHY (ERCP);  Surgeon: Clarene Essex, MD;  Location: [facility];  Service: Endoscopy;  Laterality: N/A;  . EXCISION / CURETTAGE BONE CYST FINGER  2005   rt thumb  . HEMORRHOID SURGERY    . Percutaneous Cholecytostomy tube     IVR insertion 06-13-15(Dr. Vernard Gambles)- 08-28-15 remains in place to drainage bag  . TONSILLECTOMY    . TOTAL SHOULDER ARTHROPLASTY  02/03/2012   Procedure: TOTAL SHOULDER ARTHROPLASTY;  Surgeon: [name], MD;  Location: [facility];  Service: Orthopedics;  Laterality: Left;    REVIEW OF SYSTEMS:  A comprehensive review of systems was negative except for: Eyes: positive for visual disturbance   PHYSICAL EXAMINATION: General appearance: alert, cooperative, fatigued and no distress Head: Normocephalic, without obvious abnormality, atraumatic Neck: no adenopathy, no JVD, supple, symmetrical, trachea midline and thyroid not enlarged,  symmetric, no tenderness/mass/nodules Lymph nodes: Cervical, supraclavicular, and axillary nodes normal. Resp: clear to auscultation bilaterally Back: symmetric, no curvature. ROM normal. No CVA tenderness. Cardio: regular rate and rhythm, S1, S2 normal, no murmur, click, rub or gallop GI: Right upper quadrant tenderness to palpation Extremities: extremities normal, atraumatic, no cyanosis or edema Neurologic: Alert and oriented X 3, normal strength and tone. Normal symmetric reflexes. Normal coordination and gait  ECOG PERFORMANCE STATUS: 1 - Symptomatic but completely ambulatory  Blood pressure 134/64, pulse 71, temperature 97.8 F (36.6 C), temperature source Oral, resp. rate 16, height 5' 11"  (1.803 m), weight 156 lb 1.6 oz (70.8 kg), SpO2 99 %.  LABORATORY DATA: Lab Results  Component Value Date   WBC 4.7 09/28/2016   HGB 14.6 09/28/2016   HCT 43.1 09/28/2016   MCV 95.8 09/28/2016   PLT 246 09/28/2016      Chemistry      Component Value Date/Time   NA 133 (L) 09/14/2016  1319   NA 134 (L) 08/31/2016 1209   K 4.3 09/14/2016 1319   K 4.0 08/31/2016 1209   CL 101 09/14/2016 1319   CO2 23 09/14/2016 1319   CO2 22 08/31/2016 1209   BUN 15 09/14/2016 1319   BUN 16.8 08/31/2016 1209   CREATININE 0.84 09/14/2016 1319   CREATININE 0.8 08/31/2016 1209      Component Value Date/Time   CALCIUM 9.3 09/14/2016 1319   CALCIUM 9.7 08/31/2016 1209   ALKPHOS 83 09/14/2016 1319   ALKPHOS 111 08/31/2016 1209   AST 26 09/14/2016 1319   AST 26 08/31/2016 1209   ALT 16 (L) 09/14/2016 1319   ALT 19 08/31/2016 1209   BILITOT 0.8 09/14/2016 1319   BILITOT 0.98 08/31/2016 1209       RADIOGRAPHIC STUDIES: No results found. ASSESSMENT AND PLAN: This is a very pleasant 75 years old white male with:  1)  Stage IV non-small cell lung cancer, adenocarcinoma status post induction chemotherapy with carboplatin and Alimta and currently on maintenance treatment with single agent Alimta status post 8 cycles. He has been observation for the last few months.  He had evidence for disease progression on the left adrenal gland and the patient underwent palliative radiotherapy to the left adrenal gland under the care of Dr. Sondra Come completed on 12/16/2014. He is currently undergoing treatment with immunotherapy with Nivolumab status post 39 cycles and tolerating his treatment fairly well.  He will proceed with cycle #40 of his immunotherapy today. The patient would come back for follow-up visit in 2 weeks with the start of cycle #41.  2) Atrial fibrillation: He'll continue his current medication with Cardizem, digoxin and metoprolol in addition to Eliquis as prescribed by his cardiologist Dr. [name].  3) dizziness: MRI of the brain showed no evidence of metastatic disease to the brain. The patient would see his neurologist for evaluation.  4) back pain: He will continue on Vicodin.   He was advised to call immediately if he has any concerning symptoms in the interval. The patient  voices understanding of current disease status and treatment options and is in agreement with the current care plan.  All questions were answered. The patient knows to call the clinic with any problems, questions or concerns. We can certainly see the patient much sooner if necessary.  Disclaimer: This note was dictated with voice recognition software. Similar sounding words can inadvertently be transcribed and may not be corrected upon review.

## 2016-09-30 DIAGNOSIS — R040 Epistaxis: Secondary | ICD-10-CM | POA: Diagnosis not present

## 2016-09-30 DIAGNOSIS — H9201 Otalgia, right ear: Secondary | ICD-10-CM | POA: Diagnosis not present

## 2016-09-30 DIAGNOSIS — C3490 Malignant neoplasm of unspecified part of unspecified bronchus or lung: Secondary | ICD-10-CM | POA: Diagnosis not present

## 2016-10-12 ENCOUNTER — Ambulatory Visit (HOSPITAL_BASED_OUTPATIENT_CLINIC_OR_DEPARTMENT_OTHER): Payer: Medicare Other

## 2016-10-12 ENCOUNTER — Ambulatory Visit (HOSPITAL_BASED_OUTPATIENT_CLINIC_OR_DEPARTMENT_OTHER): Payer: Medicare Other | Admitting: Internal Medicine

## 2016-10-12 ENCOUNTER — Other Ambulatory Visit (HOSPITAL_BASED_OUTPATIENT_CLINIC_OR_DEPARTMENT_OTHER): Payer: Medicare Other

## 2016-10-12 ENCOUNTER — Encounter: Payer: Self-pay | Admitting: Internal Medicine

## 2016-10-12 VITALS — HR 58

## 2016-10-12 VITALS — BP 150/66 | HR 100 | Temp 97.5°F | Resp 16 | Ht 71.0 in | Wt 156.8 lb

## 2016-10-12 DIAGNOSIS — C3431 Malignant neoplasm of lower lobe, right bronchus or lung: Secondary | ICD-10-CM

## 2016-10-12 DIAGNOSIS — Z5112 Encounter for antineoplastic immunotherapy: Secondary | ICD-10-CM

## 2016-10-12 DIAGNOSIS — R42 Dizziness and giddiness: Secondary | ICD-10-CM

## 2016-10-12 DIAGNOSIS — M545 Low back pain: Secondary | ICD-10-CM | POA: Diagnosis not present

## 2016-10-12 DIAGNOSIS — C7972 Secondary malignant neoplasm of left adrenal gland: Secondary | ICD-10-CM

## 2016-10-12 DIAGNOSIS — I4891 Unspecified atrial fibrillation: Secondary | ICD-10-CM

## 2016-10-12 LAB — COMPREHENSIVE METABOLIC PANEL
ALT: 18 U/L (ref 0–55)
ANION GAP: 10 meq/L (ref 3–11)
AST: 22 U/L (ref 5–34)
Albumin: 3.7 g/dL (ref 3.5–5.0)
Alkaline Phosphatase: 100 U/L (ref 40–150)
BUN: 16.4 mg/dL (ref 7.0–26.0)
CALCIUM: 9.3 mg/dL (ref 8.4–10.4)
CHLORIDE: 99 meq/L (ref 98–109)
CO2: 23 mEq/L (ref 22–29)
CREATININE: 0.8 mg/dL (ref 0.7–1.3)
EGFR: 85 mL/min/{1.73_m2} — AB (ref >90)
Glucose: 98 mg/dl (ref 70–140)
POTASSIUM: 3.9 meq/L (ref 3.5–5.1)
Sodium: 132 mEq/L — ABNORMAL LOW (ref 136–145)
Total Bilirubin: 0.78 mg/dL (ref 0.20–1.20)
Total Protein: 6.4 g/dL (ref 6.4–8.3)

## 2016-10-12 LAB — CBC WITH DIFFERENTIAL/PLATELET
BASO%: 0.5 % (ref 0.0–2.0)
BASOS ABS: 0 10*3/uL (ref 0.0–0.1)
EOS%: 5.7 % (ref 0.0–7.0)
Eosinophils Absolute: 0.3 10*3/uL (ref 0.0–0.5)
HEMATOCRIT: 43.6 % (ref 38.4–49.9)
HGB: 14.8 g/dL (ref 13.0–17.1)
LYMPH#: 1.1 10*3/uL (ref 0.9–3.3)
LYMPH%: 26 % (ref 14.0–49.0)
MCH: 32.4 pg (ref 27.2–33.4)
MCHC: 33.9 g/dL (ref 32.0–36.0)
MCV: 95.5 fL (ref 79.3–98.0)
MONO#: 0.5 10*3/uL (ref 0.1–0.9)
MONO%: 11.8 % (ref 0.0–14.0)
NEUT#: 2.5 10*3/uL (ref 1.5–6.5)
NEUT%: 56 % (ref 39.0–75.0)
PLATELETS: 203 10*3/uL (ref 140–400)
RBC: 4.57 10*6/uL (ref 4.20–5.82)
RDW: 13.7 % (ref 11.0–14.6)
WBC: 4.4 10*3/uL (ref 4.0–10.3)

## 2016-10-12 MED ORDER — SODIUM CHLORIDE 0.9 % IV SOLN
Freq: Once | INTRAVENOUS | Status: AC
  Administered 2016-10-12: 12:00:00 via INTRAVENOUS

## 2016-10-12 MED ORDER — SODIUM CHLORIDE 0.9 % IV SOLN
240.0000 mg | Freq: Once | INTRAVENOUS | Status: AC
  Administered 2016-10-12: 240 mg via INTRAVENOUS
  Filled 2016-10-12: qty 20

## 2016-10-12 NOTE — Progress Notes (Signed)
[facility] [phone]   Fax:(336) [place], [place] [address]  DIAGNOSIS: Stage IV (T1a, N0, M1b) non-small cell lung cancer of the right lower lobe, adenocarcinoma with negative EGFR mutation and negative ALK gene translocation diagnosed in October of 2014.   Molecular profile: Negative for RET, ALK, BRAF, MET, EGFR.  Positive for ERBB2 A775T, [id number], KRAS G13E, MAP2K1 D67N (see full report).   PRIOR THERAPY:  1) Systemic chemotherapy with carboplatin for AUC of 5 and Alimta 500 mg/M2 every 3 weeks, status post 6 cycles with stable disease. First dose on 10/23/2013. 2) Maintenance systemic chemotherapy with single agent Alimta 500 mg/M2 every 3 weeks. Status post 8 cycles.   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 Sondra Come completed on 12/16/2014.  CURRENT THERAPY: Immunotherapy with Nivolumab 3 MG/KG every 2 weeks, status post 40 cycles. First cycle 01/28/2015  CHEMOTHERAPY INTENT: Palliative  CURRENT # OF CHEMOTHERAPY CYCLES: 41 CURRENT ANTIEMETICS: Zofran, Compazine and dexamethasone  CURRENT SMOKING STATUS: Former smoker  ORAL CHEMOTHERAPY AND CONSENT: None  CURRENT BISPHOSPHONATES USE: None  PAIN MANAGEMENT: 0/10  NARCOTICS INDUCED CONSTIPATION: None  LIVING WILL AND CODE STATUS: ?   INTERVAL HISTORY: Anthony Curry 75 y.o. male returns to the clinic today for followup visit accompanied by his wife. The patient has no complaints today except for baseline dizzy spells. He tolerated the last cycle of his treatment with Nivolumab fairly well. He denied having any significant chest pain, shortness of breath, cough or hemoptysis. He has no fever or chills. He denied having any significant nausea or vomiting, no weight loss or night sweats. He is here today to start cycle #41 of his treatment.  MEDICAL HISTORY: Past  Medical History:  Diagnosis Date  . 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 Adrenal mass ([place])   . Alcoholism in remission ([place])   . Anemia of chronic disease   . 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 Anxiety   . BPH (benign prostatic hyperplasia)   . Cancer ([place])    small cell/lung  . Complication of anesthesia 09/08/2015    JITTERY AFTER GALLBLADDER SURGERY  . GERD (gastroesophageal reflux disease)   . Hypertension   . Neoplastic malignant related fatigue    IV tx. every 2 weeks last9-14-16 (Dr. Earlie Server), radiation last tx. 6 months  . 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 Persistent atrial fibrillation ([place])   . Pulmonary nodules   . Radiation 12/02/14-12/16/14   Left adrenal gland 30 Gy in 10 fractions    ALLERGIES:  has No Known Allergies.  MEDICATIONS:  Current Outpatient Prescriptions  Medication Sig Dispense Refill  . ALPRAZolam (XANAX) 0.25 MG tablet Take 0.25 mg by mouth 3 (three) times daily as needed for anxiety or sleep. And also at night for sleep.    [name] apixaban (ELIQUIS) 5 MG TABS tablet Take 5 mg by mouth 2 (two) times daily.    . Ascorbic Acid (VITAMIN C PO) Take 1,000 mg by mouth daily.     . B Complex-C (SUPER B COMPLEX PO) Take 1 each by mouth daily.     . calcium carbonate (OS-CAL) 600 MG TABS Take 600 mg by mouth daily with breakfast.    . demeclocycline (DECLOMYCIN) 150 MG tablet Take 150 mg by mouth 2 (two) times daily.    [name] diltiazem (CARDIZEM CD) 180 MG 24 hr  capsule Take 1 capsule (180 mg total) by mouth daily at 12 noon. 90 capsule 3  . furosemide (LASIX) 40 MG tablet Take 0.5 tablets (20 mg total) by mouth every other day.    [name] HYDROcodone-acetaminophen (NORCO/VICODIN) 5-325 MG tablet Take 1-2 tablets by mouth every 4 (four) hours as needed for moderate pain. 30 tablet 0  . KLOR-CON M20 20 MEQ tablet Take 20 mEq by mouth every morning.     . Lactobacillus (ACIDOPHILUS) CAPS capsule Take 1 capsule by mouth daily.    [name] MAGNESIUM PO Take  400 mg by mouth daily.     . Multiple Vitamin (MULTIVITAMIN WITH MINERALS) TABS Take 1 tablet by mouth daily.    . Nivolumab (OPDIVO IV) Inject as directed every 2 weeks    . Nutritional Supplements (ENSURE COMPLETE PO) Take 1 Can by mouth 2 (two) times daily.    . Omega-3 Fatty Acids (FISH OIL PO) Take 1 tablet by mouth daily.    . pantoprazole (PROTONIX) 40 MG tablet Take 40 mg by mouth every morning.  8  . tamsulosin (FLOMAX) 0.4 MG CAPS capsule Take 0.4 mg by mouth daily at 12 noon.     . thiamine (VITAMIN B-1) 100 MG tablet Take 1 tablet (100 mg total) by mouth daily. 30 tablet 6  . vitamin E 400 UNIT capsule Take 400 Units by mouth daily.     No current facility-administered medications for this visit.    Facility-Administered Medications Ordered in Other Visits  Medication Dose Route Frequency Provider Last Rate Last Dose  . 0.9 %  sodium chloride infusion   Intravenous Once [name], MD        SURGICAL HISTORY:  Past Surgical History:  Procedure Laterality Date  . ANKLE ARTHRODESIS  1995   rt fx-hardware in  . CARDIOVERSION N/A 09/29/2014   Procedure: CARDIOVERSION;  Surgeon: Josue Hector, MD;  Location: [facility] END[facility];  Service: Cardiovascular;  Laterality: N/A;  . CHOLECYSTECTOMY  09/08/2015    laproscopic   . CHOLECYSTECTOMY N/A 09/08/2015   Procedure: LAPAROSCOPIC CHOLECYSTECTOMY WITH ATTEMPTED INTRAOPERATIVE CHOLANGIOGRAM ;  Surgeon: Fanny Skates, MD;  Location: [facility];  Service: General;  Laterality: N/A;  . COLONOSCOPY    . ERCP N/A 09/02/2015   Procedure: ENDOSCOPIC RETROGRADE CHOLANGIOPANCREATOGRAPHY (ERCP);  Surgeon: Arta Silence, MD;  Location: [facility];  Service: Endoscopy;  Laterality: N/A;  . ERCP N/A 09/07/2015   Procedure: ENDOSCOPIC RETROGRADE CHOLANGIOPANCREATOGRAPHY (ERCP);  Surgeon: Clarene Essex, MD;  Location: [facility];  Service: Endoscopy;  Laterality: N/A;  . EXCISION / CURETTAGE BONE CYST FINGER  2005   rt thumb  . HEMORRHOID SURGERY      . Percutaneous Cholecytostomy tube     IVR insertion 06-13-15(Dr. Vernard Gambles)- 08-28-15 remains in place to drainage bag  . TONSILLECTOMY    . TOTAL SHOULDER ARTHROPLASTY  02/03/2012   Procedure: TOTAL SHOULDER ARTHROPLASTY;  Surgeon: [name], MD;  Location: [facility];  Service: Orthopedics;  Laterality: Left;    REVIEW OF SYSTEMS:  A comprehensive review of systems was negative except for: Neurological: positive for dizziness   PHYSICAL EXAMINATION: General appearance: alert, cooperative, fatigued and no distress Head: Normocephalic, without obvious abnormality, atraumatic Neck: no adenopathy, no JVD, supple, symmetrical, trachea midline and thyroid not enlarged, symmetric, no tenderness/mass/nodules Lymph nodes: Cervical, supraclavicular, and axillary nodes normal. Resp: clear to auscultation bilaterally Back: symmetric, no curvature. ROM normal. No CVA tenderness. Cardio: regular rate and rhythm, S1, S2 normal, no murmur, click, rub  or gallop GI: Right upper quadrant tenderness to palpation Extremities: extremities normal, atraumatic, no cyanosis or edema Neurologic: Alert and oriented X 3, normal strength and tone. Normal symmetric reflexes. Normal coordination and gait  ECOG PERFORMANCE STATUS: 1 - Symptomatic but completely ambulatory  Blood pressure (!) 150/66, pulse 100, temperature 97.5 F (36.4 C), temperature source Oral, resp. rate 16, height 5' 11"  (1.803 m), weight 156 lb 12.8 oz (71.1 kg), SpO2 100 %.  LABORATORY DATA: Lab Results  Component Value Date   WBC 4.4 10/12/2016   HGB 14.8 10/12/2016   HCT 43.6 10/12/2016   MCV 95.5 10/12/2016   PLT 203 10/12/2016      Chemistry      Component Value Date/Time   NA 132 (L) 10/12/2016 1021   K 3.9 10/12/2016 1021   CL 101 09/14/2016 1319   CO2 23 10/12/2016 1021   BUN 16.4 10/12/2016 1021   CREATININE 0.8 10/12/2016 1021      Component Value Date/Time   CALCIUM 9.3 10/12/2016 1021   ALKPHOS 100  10/12/2016 1021   AST 22 10/12/2016 1021   ALT 18 10/12/2016 1021   BILITOT 0.78 10/12/2016 1021       RADIOGRAPHIC STUDIES: No results found. ASSESSMENT AND PLAN: This is a very pleasant 75 years old white male with:  1)  Stage IV non-small cell lung cancer, adenocarcinoma status post induction chemotherapy with carboplatin and Alimta and currently on maintenance treatment with single agent Alimta status post 8 cycles. He has been observation for the last few months.  He had evidence for disease progression on the left adrenal gland and the patient underwent palliative radiotherapy to the left adrenal gland under the care of Dr. Sondra Come completed on 12/16/2014. He is currently undergoing treatment with immunotherapy with Nivolumab status post 40 cycles and tolerating his treatment fairly well.  He will proceed with cycle #41 of his immunotherapy today. The patient would come back for follow-up visit in 2 weeks with the start of cycle #42 after repeating CT scan of the chest, abdomen and pelvis for restaging of his disease.  2) Atrial fibrillation: He'll continue his current medication with Cardizem, digoxin and metoprolol in addition to Eliquis as prescribed by his cardiologist Dr. [name].  3) dizziness: MRI of the brain showed no evidence of metastatic disease to the brain. He was seen by neurology. He is currently followed by observation.  4) back pain: He will continue on Vicodin.   He was advised to call immediately if he has any concerning symptoms in the interval. The patient voices understanding of current disease status and treatment options and is in agreement with the current care plan.  All questions were answered. The patient knows to call the clinic with any problems, questions or concerns. We can certainly see the patient much sooner if necessary.  Disclaimer: This note was dictated with voice recognition software. Similar sounding words can inadvertently be transcribed  and may not be corrected upon review.

## 2016-10-12 NOTE — Patient Instructions (Signed)
[facility] Discharge Instructions for Patients Receiving Chemotherapy  Today you received the following chemotherapy agents Opdivo.  To help prevent nausea and vomiting after your treatment, we encourage you to take your nausea medication as directed.   If you develop nausea and vomiting that is not controlled by your nausea medication, call the clinic.   BELOW ARE SYMPTOMS THAT SHOULD BE REPORTED IMMEDIATELY:  *FEVER GREATER THAN 100.5 F  *CHILLS WITH OR WITHOUT FEVER  NAUSEA AND VOMITING THAT IS NOT CONTROLLED WITH YOUR NAUSEA MEDICATION  *UNUSUAL SHORTNESS OF BREATH  *UNUSUAL BRUISING OR BLEEDING  TENDERNESS IN MOUTH AND THROAT WITH OR WITHOUT PRESENCE OF ULCERS  *URINARY PROBLEMS  *BOWEL PROBLEMS  UNUSUAL RASH Items with * indicate a potential emergency and should be followed up as soon as possible.  Feel free to call the clinic you have any questions or concerns. The clinic phone number is [phone].  Please show the CHEMO ALERT CARD at check-in to the Emergency Department and triage nurse.    

## 2016-10-14 ENCOUNTER — Telehealth: Payer: Self-pay | Admitting: Internal Medicine

## 2016-10-14 NOTE — Telephone Encounter (Signed)
Added appointments for December and January. Spoke with patient and he will get new schedule at 11/22 visit. Central radiology will call re scan.

## 2016-10-21 ENCOUNTER — Encounter (HOSPITAL_COMMUNITY): Payer: Self-pay

## 2016-10-21 ENCOUNTER — Ambulatory Visit (HOSPITAL_COMMUNITY)
Admission: RE | Admit: 2016-10-21 | Discharge: 2016-10-21 | Disposition: A | Discharge status: Home / Self Care | Payer: Medicare Other | Source: Ambulatory Visit | Attending: Internal Medicine | Admitting: Internal Medicine

## 2016-10-21 DIAGNOSIS — R918 Other nonspecific abnormal finding of lung field: Secondary | ICD-10-CM | POA: Diagnosis not present

## 2016-10-21 DIAGNOSIS — K769 Liver disease, unspecified: Secondary | ICD-10-CM | POA: Diagnosis not present

## 2016-10-21 DIAGNOSIS — C3431 Malignant neoplasm of lower lobe, right bronchus or lung: Secondary | ICD-10-CM | POA: Diagnosis not present

## 2016-10-21 DIAGNOSIS — I700 Atherosclerosis of aorta: Secondary | ICD-10-CM | POA: Diagnosis not present

## 2016-10-21 DIAGNOSIS — K551 Chronic vascular disorders of intestine: Secondary | ICD-10-CM | POA: Diagnosis not present

## 2016-10-21 DIAGNOSIS — N323 Diverticulum of bladder: Secondary | ICD-10-CM | POA: Diagnosis not present

## 2016-10-21 DIAGNOSIS — Z5112 Encounter for antineoplastic immunotherapy: Secondary | ICD-10-CM

## 2016-10-21 DIAGNOSIS — C3411 Malignant neoplasm of upper lobe, right bronchus or lung: Secondary | ICD-10-CM | POA: Diagnosis not present

## 2016-10-21 MED ORDER — IOPAMIDOL (ISOVUE-300) INJECTION 61%
100.0000 mL | Freq: Once | INTRAVENOUS | Status: AC | PRN
  Administered 2016-10-21: 100 mL via INTRAVENOUS

## 2016-10-21 MED ORDER — IOPAMIDOL (ISOVUE-300) INJECTION 61%
INTRAVENOUS | Status: AC
  Filled 2016-10-21: qty 100

## 2016-10-24 ENCOUNTER — Telehealth: Payer: Self-pay | Admitting: *Deleted

## 2016-10-24 NOTE — Telephone Encounter (Signed)
"  I missed a call for my husband.  Do not want to leave a message but please return the call."  Called spouse call providing appointment information for 10-26-2016.

## 2016-10-25 ENCOUNTER — Telehealth: Payer: Self-pay | Admitting: *Deleted

## 2016-10-25 ENCOUNTER — Encounter (HOSPITAL_COMMUNITY): Payer: Self-pay

## 2016-10-25 ENCOUNTER — Ambulatory Visit (HOSPITAL_COMMUNITY)
Admission: RE | Admit: 2016-10-25 | Discharge: 2016-10-25 | Disposition: A | Discharge status: Home / Self Care | Payer: Medicare Other | Source: Ambulatory Visit | Attending: Internal Medicine | Admitting: Internal Medicine

## 2016-10-25 DIAGNOSIS — J439 Emphysema, unspecified: Secondary | ICD-10-CM | POA: Diagnosis not present

## 2016-10-25 DIAGNOSIS — R918 Other nonspecific abnormal finding of lung field: Secondary | ICD-10-CM | POA: Insufficient documentation

## 2016-10-25 DIAGNOSIS — I700 Atherosclerosis of aorta: Secondary | ICD-10-CM | POA: Insufficient documentation

## 2016-10-25 DIAGNOSIS — I2510 Atherosclerotic heart disease of native coronary artery without angina pectoris: Secondary | ICD-10-CM | POA: Insufficient documentation

## 2016-10-25 DIAGNOSIS — Z5112 Encounter for antineoplastic immunotherapy: Secondary | ICD-10-CM

## 2016-10-25 DIAGNOSIS — C3431 Malignant neoplasm of lower lobe, right bronchus or lung: Secondary | ICD-10-CM | POA: Insufficient documentation

## 2016-10-25 MED ORDER — IOPAMIDOL (ISOVUE-300) INJECTION 61%
INTRAVENOUS | Status: AC
  Filled 2016-10-25: qty 75

## 2016-10-25 MED ORDER — IOPAMIDOL (ISOVUE-300) INJECTION 61%
75.0000 mL | Freq: Once | INTRAVENOUS | Status: AC | PRN
  Administered 2016-10-25: 75 mL via INTRAVENOUS

## 2016-10-25 MED ORDER — SODIUM CHLORIDE 0.9 % IJ SOLN
INTRAMUSCULAR | Status: AC
  Filled 2016-10-25: qty 50

## 2016-10-25 NOTE — Telephone Encounter (Signed)
Wife is calling wondering why they have to return for another scan.  The one they did last Friday was of his stomach and "he does not have stomach cancer, he has lung cancer."  They have their CT appt at 10:30 this am and then see Dr. [name] tomorrow.  Advised her to check with the CT staff when they go this am and see why the CT was done on Friday.  They can also discuss with Dr. [name] tomorrow.  She appreciated the call back.

## 2016-10-26 ENCOUNTER — Ambulatory Visit (HOSPITAL_BASED_OUTPATIENT_CLINIC_OR_DEPARTMENT_OTHER): Payer: Medicare Other

## 2016-10-26 ENCOUNTER — Encounter: Payer: Self-pay | Admitting: Internal Medicine

## 2016-10-26 ENCOUNTER — Ambulatory Visit (HOSPITAL_BASED_OUTPATIENT_CLINIC_OR_DEPARTMENT_OTHER): Payer: Medicare Other | Admitting: Internal Medicine

## 2016-10-26 ENCOUNTER — Other Ambulatory Visit (HOSPITAL_BASED_OUTPATIENT_CLINIC_OR_DEPARTMENT_OTHER): Payer: Medicare Other

## 2016-10-26 VITALS — BP 127/69 | HR 86 | Temp 98.1°F | Resp 18 | Ht 71.0 in | Wt 156.0 lb

## 2016-10-26 DIAGNOSIS — R42 Dizziness and giddiness: Secondary | ICD-10-CM | POA: Diagnosis not present

## 2016-10-26 DIAGNOSIS — M545 Low back pain: Secondary | ICD-10-CM | POA: Diagnosis not present

## 2016-10-26 DIAGNOSIS — Z79899 Other long term (current) drug therapy: Secondary | ICD-10-CM

## 2016-10-26 DIAGNOSIS — G4700 Insomnia, unspecified: Secondary | ICD-10-CM | POA: Diagnosis not present

## 2016-10-26 DIAGNOSIS — C3431 Malignant neoplasm of lower lobe, right bronchus or lung: Secondary | ICD-10-CM

## 2016-10-26 DIAGNOSIS — Z5112 Encounter for antineoplastic immunotherapy: Secondary | ICD-10-CM

## 2016-10-26 DIAGNOSIS — C7972 Secondary malignant neoplasm of left adrenal gland: Secondary | ICD-10-CM

## 2016-10-26 DIAGNOSIS — I4891 Unspecified atrial fibrillation: Secondary | ICD-10-CM | POA: Diagnosis not present

## 2016-10-26 LAB — COMPREHENSIVE METABOLIC PANEL
ALT: 15 U/L (ref 0–55)
ANION GAP: 10 meq/L (ref 3–11)
AST: 21 U/L (ref 5–34)
Albumin: 3.8 g/dL (ref 3.5–5.0)
Alkaline Phosphatase: 103 U/L (ref 40–150)
BUN: 16.7 mg/dL (ref 7.0–26.0)
CALCIUM: 9.5 mg/dL (ref 8.4–10.4)
CHLORIDE: 99 meq/L (ref 98–109)
CO2: 24 meq/L (ref 22–29)
CREATININE: 0.8 mg/dL (ref 0.7–1.3)
EGFR: 86 mL/min/{1.73_m2} — AB (ref >90)
Glucose: 115 mg/dl (ref 70–140)
POTASSIUM: 3.9 meq/L (ref 3.5–5.1)
Sodium: 133 mEq/L — ABNORMAL LOW (ref 136–145)
Total Bilirubin: 0.72 mg/dL (ref 0.20–1.20)
Total Protein: 6.5 g/dL (ref 6.4–8.3)

## 2016-10-26 LAB — CBC WITH DIFFERENTIAL/PLATELET
BASO%: 0.8 % (ref 0.0–2.0)
BASOS ABS: 0 10*3/uL (ref 0.0–0.1)
EOS%: 5.9 % (ref 0.0–7.0)
Eosinophils Absolute: 0.2 10*3/uL (ref 0.0–0.5)
HEMATOCRIT: 41.2 % (ref 38.4–49.9)
HGB: 14.7 g/dL (ref 13.0–17.1)
LYMPH#: 1.1 10*3/uL (ref 0.9–3.3)
LYMPH%: 27.4 % (ref 14.0–49.0)
MCH: 32.8 pg (ref 27.2–33.4)
MCHC: 35.7 g/dL (ref 32.0–36.0)
MCV: 92 fL (ref 79.3–98.0)
MONO#: 0.4 10*3/uL (ref 0.1–0.9)
MONO%: 10.5 % (ref 0.0–14.0)
NEUT#: 2.2 10*3/uL (ref 1.5–6.5)
NEUT%: 55.4 % (ref 39.0–75.0)
PLATELETS: 225 10*3/uL (ref 140–400)
RBC: 4.48 10*6/uL (ref 4.20–5.82)
RDW: 13.7 % (ref 11.0–14.6)
WBC: 3.9 10*3/uL — ABNORMAL LOW (ref 4.0–10.3)

## 2016-10-26 LAB — TSH: TSH: 5.016 m[IU]/L — AB (ref 0.320–4.118)

## 2016-10-26 MED ORDER — SODIUM CHLORIDE 0.9 % IV SOLN
240.0000 mg | Freq: Once | INTRAVENOUS | Status: AC
  Administered 2016-10-26: 240 mg via INTRAVENOUS
  Filled 2016-10-26: qty 20

## 2016-10-26 MED ORDER — SODIUM CHLORIDE 0.9 % IV SOLN
Freq: Once | INTRAVENOUS | Status: AC
  Administered 2016-10-26: 13:00:00 via INTRAVENOUS

## 2016-10-26 NOTE — Patient Instructions (Signed)
[facility] Discharge Instructions for Patients Receiving Chemotherapy  Today you received the following chemotherapy agents Opdivo.  To help prevent nausea and vomiting after your treatment, we encourage you to take your nausea medication as directed.   If you develop nausea and vomiting that is not controlled by your nausea medication, call the clinic.   BELOW ARE SYMPTOMS THAT SHOULD BE REPORTED IMMEDIATELY:  *FEVER GREATER THAN 100.5 F  *CHILLS WITH OR WITHOUT FEVER  NAUSEA AND VOMITING THAT IS NOT CONTROLLED WITH YOUR NAUSEA MEDICATION  *UNUSUAL SHORTNESS OF BREATH  *UNUSUAL BRUISING OR BLEEDING  TENDERNESS IN MOUTH AND THROAT WITH OR WITHOUT PRESENCE OF ULCERS  *URINARY PROBLEMS  *BOWEL PROBLEMS  UNUSUAL RASH Items with * indicate a potential emergency and should be followed up as soon as possible.  Feel free to call the clinic you have any questions or concerns. The clinic phone number is [phone].  Please show the CHEMO ALERT CARD at check-in to the Emergency Department and triage nurse.    

## 2016-10-26 NOTE — Progress Notes (Signed)
[facility] Telephone:([phone]   Fax:([phone]) [place], [place] [address]  DIAGNOSIS: Stage IV (T1a, N0, M1b) non-small cell lung cancer of the right lower lobe, adenocarcinoma with negative EGFR mutation and negative ALK gene translocation diagnosed in October of 2014.   Molecular profile: Negative for RET, ALK, BRAF, MET, EGFR.  Positive for ERBB2 A775T, [id number], KRAS G13E, MAP2K1 D67N (see full report).   PRIOR THERAPY:  1) Systemic chemotherapy with carboplatin for AUC of 5 and Alimta 500 mg/M2 every 3 weeks, status post 6 cycles with stable disease. First dose on 10/23/2013. 2) Maintenance systemic chemotherapy with single agent Alimta 500 mg/M2 every 3 weeks. Status post 8 cycles.   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 Sondra Come completed on 12/16/2014.  CURRENT THERAPY: Immunotherapy with Nivolumab 3 MG/KG every 2 weeks, status post 41 cycles. First cycle 01/28/2015  CHEMOTHERAPY INTENT: Palliative  CURRENT # OF CHEMOTHERAPY CYCLES: 42 CURRENT ANTIEMETICS: Zofran, Compazine and dexamethasone  CURRENT SMOKING STATUS: Former smoker  ORAL CHEMOTHERAPY AND CONSENT: None  CURRENT BISPHOSPHONATES USE: None  PAIN MANAGEMENT: 0/10  NARCOTICS INDUCED CONSTIPATION: None  LIVING WILL AND CODE STATUS: ?   INTERVAL HISTORY: Anthony Curry 75 y.o. male returns to the clinic today for followup visit accompanied by his wife. The patient has no complaints today except for baseline dizzy spells. He has no significant change since his last visit except for occasional insomnia but he takes naps during the day. He tolerated the last cycle of his treatment with Nivolumab fairly well. He denied having any significant chest pain, shortness of breath, cough or hemoptysis. He has no fever or chills. He denied having any significant nausea or vomiting, no  weight loss or night sweats. He had repeat CT scan of the chest, abdomen and pelvis performed recently and he is here for evaluation and discussion of his scan results.  MEDICAL HISTORY: Past Medical History:  Diagnosis Date  . 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[place])   . Adrenal mass ([place])   . Alcoholism in remission ([place])   . Anemia of chronic disease   . 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 Anxiety   . BPH (benign prostatic hyperplasia)   . Cancer ([place])    small cell/lung  . Complication of anesthesia 09/08/2015    JITTERY AFTER GALLBLADDER SURGERY  . GERD (gastroesophageal reflux disease)   . Hypertension   . Neoplastic malignant related fatigue    IV tx. every 2 weeks last9-14-16 (Dr. Earlie Server), radiation last tx. 6 months  . 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 Persistent atrial fibrillation ([place])   . Pulmonary nodules   . Radiation 12/02/14-12/16/14   Left adrenal gland 30 Gy in 10 fractions    ALLERGIES:  has No Known Allergies.  MEDICATIONS:  Current Outpatient Prescriptions  Medication Sig Dispense Refill  . ALPRAZolam (XANAX) 0.25 MG tablet Take 0.25 mg by mouth 3 (three) times daily as needed for anxiety or sleep. And also at night for sleep.    [name] apixaban (ELIQUIS) 5 MG TABS tablet Take 5 mg by mouth 2 (two) times daily.    . Ascorbic Acid (VITAMIN C PO) Take 1,000 mg by mouth daily.     . B Complex-C (SUPER B COMPLEX PO) Take 1 each by mouth daily.     . calcium carbonate (OS-CAL) 600 MG TABS Take 600 mg  by mouth daily with breakfast.    . demeclocycline (DECLOMYCIN) 150 MG tablet Take 150 mg by mouth 2 (two) times daily.    [name] diltiazem (CARDIZEM CD) 180 MG 24 hr capsule Take 1 capsule (180 mg total) by mouth daily at 12 noon. 90 capsule 3  . furosemide (LASIX) 40 MG tablet Take 0.5 tablets (20 mg total) by mouth every other day.    [name] HYDROcodone-acetaminophen (NORCO/VICODIN) 5-325 MG tablet Take 1-2 tablets by mouth every 4 (four) hours as needed for  moderate pain. 30 tablet 0  . KLOR-CON M20 20 MEQ tablet Take 20 mEq by mouth every morning.     . Lactobacillus (ACIDOPHILUS) CAPS capsule Take 1 capsule by mouth daily.    [name] MAGNESIUM PO Take 400 mg by mouth daily.     . Multiple Vitamin (MULTIVITAMIN WITH MINERALS) TABS Take 1 tablet by mouth daily.    . Nivolumab (OPDIVO IV) Inject as directed every 2 weeks    . Nutritional Supplements (ENSURE COMPLETE PO) Take 1 Can by mouth 2 (two) times daily.    . Omega-3 Fatty Acids (FISH OIL PO) Take 1 tablet by mouth daily.    . pantoprazole (PROTONIX) 40 MG tablet Take 40 mg by mouth every morning.  8  . tamsulosin (FLOMAX) 0.4 MG CAPS capsule Take 0.4 mg by mouth daily at 12 noon.     . thiamine (VITAMIN B-1) 100 MG tablet Take 1 tablet (100 mg total) by mouth daily. 30 tablet 6  . vitamin E 400 UNIT capsule Take 400 Units by mouth daily.     No current facility-administered medications for this visit.    Facility-Administered Medications Ordered in Other Visits  Medication Dose Route Frequency Provider Last Rate Last Dose  . 0.9 %  sodium chloride infusion   Intravenous Once [name], MD        SURGICAL HISTORY:  Past Surgical History:  Procedure Laterality Date  . ANKLE ARTHRODESIS  1995   rt fx-hardware in  . CARDIOVERSION N/A 09/29/2014   Procedure: CARDIOVERSION;  Surgeon: Josue Hector, MD;  Location[facility] ENDOSCOPY;  Service: Cardiovascular;  Laterality: N/A;  . CHOLECYSTECTOMY  09/08/2015    laproscopic   . CHOLECYSTECTOMY N/A 09/08/2015   Procedure: LAPAROSCOPIC CHOLECYSTECTOMY WITH ATTEMPTED INTRAOPERATIVE CHOLANGIOGRAM ;  Surgeon: Fanny Skates, MD;  Location: [facility];  Service: General;  Laterality: N/A;  . COLONOSCOPY    . ERCP N/A 09/02/2015   Procedure: ENDOSCOPIC RETROGRADE CHOLANGIOPANCREATOGRAPHY (ERCP);  Surgeon: Arta Silence, MD;  Location: [facility] ENDOSCOPY;  Service: Endoscopy;  Laterality: N/A;  . ERCP N/A 09/07/2015   Procedure: ENDOSCOPIC RETROGRADE  CHOLANGIOPANCREATOGRAPHY (ERCP);  Surgeon: Clarene Essex, MD;  Location: [facility] ENDOSCOPY;  Service: Endoscopy;  Laterality: N/A;  . EXCISION / CURETTAGE BONE CYST FINGER  2005   rt thumb  . HEMORRHOID SURGERY    . Percutaneous Cholecytostomy tube     IVR insertion 06-13-15(Dr. Vernard Gambles)- 08-28-15 remains in place to drainage bag  . TONSILLECTOMY    . TOTAL SHOULDER ARTHROPLASTY  02/03/2012   Procedure: TOTAL SHOULDER ARTHROPLASTY;  Surgeon: [name], MD;  Location: [facility];  Service: Orthopedics;  Laterality: Left;    REVIEW OF SYSTEMS:  Constitutional: negative Eyes: negative Ears, nose, mouth, throat, and face: negative Respiratory: positive for dyspnea on exertion Cardiovascular: negative Gastrointestinal: negative Genitourinary:negative Integument/breast: negative Hematologic/lymphatic: negative Musculoskeletal:negative Neurological: negative Behavioral/Psych: positive for sleep disturbance Endocrine: negative Allergic/Immunologic: negative   PHYSICAL EXAMINATION: General appearance: alert, cooperative, fatigued and  no distress Head: Normocephalic, without obvious abnormality, atraumatic Neck: no adenopathy, no JVD, supple, symmetrical, trachea midline and thyroid not enlarged, symmetric, no tenderness/mass/nodules Lymph nodes: Cervical, supraclavicular, and axillary nodes normal. Resp: clear to auscultation bilaterally Back: symmetric, no curvature. ROM normal. No CVA tenderness. Cardio: regular rate and rhythm, S1, S2 normal, no murmur, click, rub or gallop GI: Right upper quadrant tenderness to palpation Extremities: extremities normal, atraumatic, no cyanosis or edema Neurologic: Alert and oriented X 3, normal strength and tone. Normal symmetric reflexes. Normal coordination and gait  ECOG PERFORMANCE STATUS: 1 - Symptomatic but completely ambulatory  Blood pressure 127/69, pulse 86, temperature 98.1 F (36.7 C), temperature source Oral, resp. rate 18,  height 5' 11" (1.803 m), weight 156 lb (70.8 kg), SpO2 97 %.  LABORATORY DATA: Lab Results  Component Value Date   WBC 3.9 (L) 10/26/2016   HGB 14.7 10/26/2016   HCT 41.2 10/26/2016   MCV 92.0 10/26/2016   PLT 225 10/26/2016      Chemistry      Component Value Date/Time   NA 133 (L) 10/26/2016 1101   K 3.9 10/26/2016 1101   CL 101 09/14/2016 1319   CO2 24 10/26/2016 1101   BUN 16.7 10/26/2016 1101   CREATININE 0.8 10/26/2016 1101      Component Value Date/Time   CALCIUM 9.5 10/26/2016 1101   ALKPHOS 103 10/26/2016 1101   AST 21 10/26/2016 1101   ALT 15 10/26/2016 1101   BILITOT 0.72 10/26/2016 1101       RADIOGRAPHIC STUDIES: Ct Chest W Contrast  Result Date: 10/25/2016 CLINICAL DATA:  Right lower lobe lung cancer. EXAM: CT CHEST WITH CONTRAST TECHNIQUE: Multidetector CT imaging of the chest was performed during intravenous contrast administration. CONTRAST:  61m ISOVUE-300 IOPAMIDOL (ISOVUE-300) INJECTION 61% COMPARISON:  08/29/2016. FINDINGS: Cardiovascular: Heart size normal. Trace anterior pericardial fluid again noted. Coronary artery calcification is noted. Atherosclerotic calcification is noted in the wall of the thoracic aorta. Mediastinum/Nodes: No mediastinal lymphadenopathy. There is no hilar lymphadenopathy. The esophagus has normal imaging features. There is no axillary lymphadenopathy. Lungs/Pleura: Spiculated left upper lobe nodule measures 1.8 x 1.4 cm today compared to 1.2 x 1.2 cm previously. Lesion appears more confluent than on the prior study. The previous identified chronic areas of patchy ground-glass attenuation/architectural distortion show no substantial interval change. Right lung lesion measured previously at 2.6 x 1.7 cm now measures 2.5 x 1.4 cm. One of the dominant lesions in the left lung was measured previously at 2.7 x 1.9 cm and measures 2.6 x 1.7 cm today. No focal airspace consolidation. No pulmonary edema or pleural effusion. Linear scarring  noted in the lung bases bilaterally. Stable appearance posterior left lower pleural calcification. Upper Abdomen: Stable appearance tiny hypervascular foci in dome of liver and caudate lobe. Gallbladder surgically absent. Abdominal aortic atherosclerosis noted. Musculoskeletal: Heterogeneous mineralization in the spine likely a combination of osteopenia and degenerative changes. Degenerative changes noted in the right shoulder. Patient is status post left shoulder replacement. Right-sided gynecomastia stable. IMPRESSION: 1. Interval progression of left upper lobe pulmonary nodule, suspicious for recurrent disease. PET-CT may prove helpful to further evaluate. 2. Other scattered areas of ground-glass attenuation/architectural distortion in the lungs bilaterally are stable. 3. Emphysema. 4. Coronary artery and thoracoabdominal aortic atherosclerosis. Electronically Signed   By: EMisty StanleyM.D.   On: 10/25/2016 14:49   Ct Abdomen Pelvis W Contrast  Result Date: 10/21/2016 CLINICAL DATA:  Restaging of right-sided lung cancer. Diagnosed 10/14. Ongoing chemotherapy. EXAM:  CT ABDOMEN AND PELVIS WITH CONTRAST TECHNIQUE: Multidetector CT imaging of the abdomen and pelvis was performed using the standard protocol following bolus administration of intravenous contrast. CONTRAST:  126m ISOVUE-300 IOPAMIDOL (ISOVUE-300) INJECTION 61% COMPARISON:  08/29/2016 FINDINGS: Lower chest: Bibasilar emphysema with left base scarring. Right lower lobe clustered nodules measure maximally 5 mm on image 2/series 6, new. More anterior 3 mm right lower lobe pulmonary nodule is stable on image 8/series 6. Normal heart size. Trace anterior pericardial fluid or thickening is decreased and favored to be physiologic. Hepatobiliary: Tiny hypo attenuating high left liver lobe lesions are unchanged. Hepatic dome 6 mm hyper enhancing focus on image 15/series 2 is unchanged. Probable cyst or minimally complex cyst in the inferior right hepatic  lobe is unchanged in size, including at 8 mm on image 34/series 2. There is also a pericholecystic liver cyst of less than a cm in image 30. Cholecystectomy, without biliary ductal dilatation. Minimal pneumobilia in the left hepatic lobe is decreased. Pancreas: Mild pancreatic atrophy. No duct dilatation or acute inflammation. Spleen: Normal in size, without focal abnormality. Adrenals/Urinary Tract: Normal left adrenal gland. Mild right adrenal nodularity is similar and has been present back to 02/24/14, favoring adenoma. Mild right renal scarring. Normal left kidney, without hydronephrosis. Small bladder diverticular are again identified. Stomach/Bowel: Normal stomach, without wall thickening. Normal colon, appendix, and terminal ileum. Normal small bowel. Vascular/Lymphatic: Advanced aortic and branch vessel atherosclerosis. SMA origin marked stenosis on image 24/series 2, chronic. No abdominopelvic adenopathy. Reproductive: Normal prostate. Other: No significant free fluid. Musculoskeletal: Left ischial sclerosis on image 76/ series 2 is unchanged and favored to be benign. Osteopenia. Left iliac and acetabular sub cm sclerotic lesions are similar and likely bone islands. A right iliac lucent lesion has been present back to 2015 and can be presumed benign. Posttraumatic or postsurgical deformities of lower right-sided ribs. Advanced lumbosacral spondylosis with Schmorl's node deformities. IMPRESSION: 1. No acute process or evidence of metastatic disease in the abdomen or pelvis. 2. Similar liver lesions, favored to represent benign entities. 3. 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 4. Multiple bladder diverticular, suggesting a component of outlet obstruction. 5. New right lower lobe clustered nodularity since 08/29/2016. Favored to represent infection. Correlate with infectious symptoms. Recommend attention on follow-up. Electronically Signed   By:  KAbigail MiyamotoM.D.   On: 10/21/2016 16:14   ASSESSMENT AND PLAN: This is a very pleasant 75years old white male with:  1)  Stage IV non-small cell lung cancer, adenocarcinoma status post induction chemotherapy with carboplatin and Alimta and currently on maintenance treatment with single agent Alimta status post 8 cycles. He has been observation for the last few months.  He had evidence for disease progression on the left adrenal gland and the patient underwent palliative radiotherapy to the left adrenal gland under the care of Dr. KSondra Comecompleted on 12/16/2014. He is currently undergoing treatment with immunotherapy with Nivolumab status post 41 cycles and tolerating his treatment fairly well.  The recent CT scan of the chest, abdomen and pelvis showed further enlargement of the left upper lobe lung nodule. I discussed the scan results and showed the images to the patient and his wife. I recommended for him to continue his current treatment with immunotherapy with Nivolumab with cycle #42 today. For the enlarging left upper lobe pulmonary nodule, I will refer the patient to radiation oncology for consideration of stereotactic radiotherapy to this lesion as the patient has no evidence of  disease progression in any other sites. The patient would come back for follow-up visit in 2 weeks with the start of cycle #43.   2) Atrial fibrillation: He'll continue his current medication with Cardizem, digoxin and metoprolol in addition to Eliquis as prescribed by his cardiologist Dr. [name].  3) dizziness: MRI of the brain showed no evidence of metastatic disease to the brain. He was seen by neurology. He is currently followed by observation.  4) back pain: He will continue on Vicodin.   He was advised to call immediately if he has any concerning symptoms in the interval. The patient voices understanding of current disease status and treatment options and is in agreement with the current care plan.  All  questions were answered. The patient knows to call the clinic with any problems, questions or concerns. We can certainly see the patient much sooner if necessary.  Disclaimer: This note was dictated with voice recognition software. Similar sounding words can inadvertently be transcribed and may not be corrected upon review.

## 2016-10-28 ENCOUNTER — Telehealth: Payer: Self-pay | Admitting: Internal Medicine

## 2016-10-28 NOTE — Telephone Encounter (Signed)
Patient already on schedule for lab/fu/tx q2w and also appointment with radonc per 11/22 los.

## 2016-10-31 DIAGNOSIS — H353221 Exudative age-related macular degeneration, left eye, with active choroidal neovascularization: Secondary | ICD-10-CM | POA: Diagnosis not present

## 2016-10-31 NOTE — Progress Notes (Signed)
Thoracic Location of Tumor / Histology: Stage IV non-small cell lung cancer, adenocarcinoma - enlarging left upper lobe lung nodule.  Patient presented with recent CT scan of the chest, abdomen and pelvis showed further enlargement of the left upper lobe lung nodule.   Tobacco/Marijuana/Snuff/ETOH use: smoked 2 ppd for 30 years.  Quit in 1992.    Past/Anticipated interventions by cardiothoracic surgery, if any: no  Past/Anticipated interventions by medical oncology, if [id number] chemotherapy with carboplatin for AUC of 5 and Alimta 500 mg/M2 every 3 weeks, status post 6 cycles with stable disease. First dose on 10/23/2013. 2) Maintenance systemic chemotherapy with single agent Alimta 500 mg/M2 every 3 weeks. Status post 8 cycles. Immunotherapy with Nivolumab 3 MG/KG every 2 weeks, status post 41 cycles. First cycle 01/28/2015.    Signs/Symptoms  Weight changes, if any: no  Respiratory complaints, if any: has a cough with yellow sputum in the mornings.  Hemoptysis, if any: no  Pain issues, if any:  no  SAFETY ISSUES:  Prior radiation? 12/02/14-12/16/14 Left adrenal gland 30 Gy in 10 fractions  Pacemaker/ICD? no   Possible current pregnancy?no  Is the patient on methotrexate? no  Current Complaints / other details:  Patient is here with his wife.  BP 139/80 (BP Location: Left Arm, Patient Position: Sitting)   Pulse 75   Temp 98.2 F (36.8 C) (Oral)   Ht '5\' 11"'$  (1.803 m)   Wt 151 lb 9.6 oz (68.8 kg)   SpO2 98%   BMI 21.14 kg/m    Wt Readings from Last 3 Encounters:  11/01/16 151 lb 9.6 oz (68.8 kg)  10/26/16 156 lb (70.8 kg)  10/12/16 156 lb 12.8 oz (71.1 kg)

## 2016-11-01 ENCOUNTER — Ambulatory Visit
Admission: RE | Admit: 2016-11-01 | Discharge: 2016-11-01 | Disposition: A | Discharge status: Home / Self Care | Payer: Medicare Other | Source: Ambulatory Visit | Attending: Radiation Oncology | Admitting: Radiation Oncology

## 2016-11-01 ENCOUNTER — Ambulatory Visit: Payer: Medicare Other

## 2016-11-01 ENCOUNTER — Encounter: Payer: Self-pay | Admitting: Radiation Oncology

## 2016-11-01 DIAGNOSIS — C3431 Malignant neoplasm of lower lobe, right bronchus or lung: Secondary | ICD-10-CM | POA: Diagnosis not present

## 2016-11-01 DIAGNOSIS — Z510 Encounter for antineoplastic radiation therapy: Secondary | ICD-10-CM | POA: Insufficient documentation

## 2016-11-01 DIAGNOSIS — Z85858 Personal history of malignant neoplasm of other endocrine glands: Secondary | ICD-10-CM | POA: Diagnosis not present

## 2016-11-01 DIAGNOSIS — C3412 Malignant neoplasm of upper lobe, left bronchus or lung: Secondary | ICD-10-CM | POA: Diagnosis not present

## 2016-11-01 DIAGNOSIS — C3490 Malignant neoplasm of unspecified part of unspecified bronchus or lung: Secondary | ICD-10-CM

## 2016-11-01 DIAGNOSIS — Z923 Personal history of irradiation: Secondary | ICD-10-CM | POA: Diagnosis not present

## 2016-11-01 NOTE — Progress Notes (Signed)
Radiation Oncology         [phone] ________________________________  Outpatient Re-Consultation  Name: Anthony Curry MRN: [id number]  Date: 11/01/2016  DOB: 08-20-1941  VO:[id number] Sheppard Coil, MD  [name], MD   REFERRING PHYSICIAN: [name], MD  DIAGNOSIS: Stage IV (T1a, N0, M1b) non-small cell lung cancer of the right lower lobe, adenocarcinoma with negative EGFR mutation and negative ALK gene translocation diagnosed in October of 2014.   HISTORY OF PRESENT ILLNESS::Anthony Curry is a 75 y.o. male with Stage IV non-small cell lung cancer, adenocarcinoma with negative EGFR mutation and negative ALK gene translocation initially diagnosed in October 2014. The patient presents to the clinic today at Dr. Worthy Flank request to discuss the role of radiation in the treatment of his disease.  The patient had a recent CT scan of the chest, abdomen, and pelvis showing further enlargement of the left upper lobe lung nodule.  The patient denies any weight changes. He denies pain. He reports a cough with yellow sputum in the mornings. Denies hemoptysis. He presents to the clinic today with his wife.  CURRENT THERAPY: Immunotherapy with Nivolumab 3 MG/KG every 2 weeks, status post 41 cycles. 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); Pulmonary nodules; and Radiation (12/02/14-12/16/14).    PAST SURGICAL  HISTORY: Past Surgical History:  Procedure Laterality Date  . ANKLE ARTHRODESIS  1995   rt fx-hardware in  . CARDIOVERSION N/A 09/29/2014   Procedure: CARDIOVERSION;  Surgeon: Josue Hector, MD;  Location: [facility] ENDOSCOPY;  Service: Cardiovascular;  Laterality: N/A;  . CHOLECYSTECTOMY  09/08/2015    laproscopic   . CHOLECYSTECTOMY N/A 09/08/2015   Procedure: LAPAROSCOPIC CHOLECYSTECTOMY WITH ATTEMPTED INTRAOPERATIVE CHOLANGIOGRAM ;  Surgeon: Fanny Skates, MD;  Location: [place];  Service: General;  Laterality: N/A;  . COLONOSCOPY    . ERCP N/A 09/02/2015   Procedure: ENDOSCOPIC RETROGRADE CHOLANGIOPANCREATOGRAPHY (ERCP);  Surgeon: Arta Silence, MD;  Location: [facility] ENDOSCOPY;  Service: Endoscopy;  Laterality: N/A;  . ERCP N/A 09/07/2015   Procedure: ENDOSCOPIC RETROGRADE CHOLANGIOPANCREATOGRAPHY (ERCP);  Surgeon: Clarene Essex, MD;  Location: [facility] ENDOSCOPY;  Service: Endoscopy;  Laterality: N/A;  . EXCISION / CURETTAGE BONE CYST FINGER  2005   rt thumb  . HEMORRHOID SURGERY    . Percutaneous Cholecytostomy tube     IVR insertion 06-13-15(Dr. Vernard Gambles)- 08-28-15 remains in place to drainage bag  . TONSILLECTOMY    . TOTAL SHOULDER ARTHROPLASTY  02/03/2012   Procedure: TOTAL SHOULDER ARTHROPLASTY;  Surgeon: [name], MD;  Location: [facility];  Service: Orthopedics;  Laterality: Left;    FAMILY HISTORY: family history includes Brain cancer in his brother; Diabetes in his paternal grandmother; Heart attack in his brother and father; Hypertension in his father; Lung cancer in his mother.  SOCIAL HISTORY:  reports that he quit smoking about 25 years ago. His smoking use included Cigarettes. He has a 60.00 pack-year smoking history. He has never used smokeless tobacco. He reports that he drinks alcohol. He reports that he does not use drugs.  ALLERGIES: Patient has no known allergies.  MEDICATIONS:  Current Outpatient Prescriptions  Medication Sig Dispense Refill  . ALPRAZolam  (XANAX) 0.25 MG tablet Take 0.25 mg by mouth 3 (three) times daily as needed for anxiety or sleep. And also at night for sleep.    [name] apixaban (ELIQUIS) 5 MG TABS tablet Take 5 mg by mouth 2 (two) times daily.    . Ascorbic Acid (VITAMIN C PO) Take 1,000 mg by mouth daily.     . B Complex-C (SUPER B COMPLEX PO) Take 1 each by mouth daily.     . calcium carbonate (OS-CAL) 600 MG TABS Take 600 mg by mouth daily with breakfast.    . demeclocycline (DECLOMYCIN) 150 MG tablet Take 150 mg by mouth 2 (two) times daily.    [name] diltiazem (CARDIZEM CD) 180 MG 24 hr capsule Take 1 capsule (180 mg total) by mouth daily at 12 noon. 90 capsule 3  . furosemide (LASIX) 40 MG tablet Take 0.5 tablets (20 mg total) by mouth every other day.    [name] HYDROcodone-acetaminophen (NORCO/VICODIN) 5-325 MG tablet Take 1-2 tablets by mouth every 4 (four) hours as needed for moderate pain. 30 tablet 0  . KLOR-CON M20 20 MEQ tablet Take 20 mEq by mouth every morning.     . Lactobacillus (ACIDOPHILUS) CAPS capsule Take 1 capsule by mouth daily.    [name] MAGNESIUM PO Take 400 mg by mouth daily.     . Multiple Vitamin (MULTIVITAMIN WITH MINERALS) TABS Take 1 tablet by mouth daily.    . Nivolumab (OPDIVO IV) Inject as directed every 2 weeks    . Nutritional Supplements (ENSURE COMPLETE PO) Take 1 Can by mouth 2 (two) times daily.    . Omega-3 Fatty Acids (FISH OIL PO) Take 1 tablet by mouth daily.    . pantoprazole (PROTONIX) 40 MG tablet Take 40 mg by mouth every morning.  8  . tamsulosin (FLOMAX) 0.4 MG CAPS capsule Take 0.4 mg by mouth daily at 12 noon.     . thiamine (VITAMIN B-1) 100 MG tablet Take 1 tablet (100 mg total) by mouth daily. 30 tablet 6  . vitamin E 400 UNIT capsule Take 400 Units by mouth daily.     No current facility-administered medications for this encounter.    Facility-Administered Medications Ordered in Other Encounters  Medication Dose Route Frequency Provider Last Rate Last Dose  . 0.9 %  sodium chloride  infusion   Intravenous Once [name], MD        REVIEW OF SYSTEMS:  A 15 point review of systems is documented in the electronic medical record. This was obtained by the nursing staff. However, I reviewed this with the patient to discuss relevant findings and make appropriate changes.  Pertinent items noted in HPI and remainder of comprehensive ROS otherwise negative.   PHYSICAL EXAM:  height is 5' 11"  (1.803 m) and weight is 151 lb 9.6 oz (68.8 kg). His oral temperature is 98.2 F (36.8 C). His blood pressure is 139/80 and his pulse is 75. His oxygen saturation is 98%.   General: Alert and oriented, in no acute distress HEENT: Head is normocephalic. Extraocular movements are intact. Oropharynx is clear. Neck: Neck is supple, no palpable cervical or supraclavicular lymphadenopathy. Heart: Regular in rate and rhythm with no murmurs, rubs, or gallops. Chest: Clear to auscultation bilaterally, with no rhonchi, wheezes, or rales.   LABORATORY DATA:  Lab Results  Component Value Date   WBC 3.9 (L) 10/26/2016   HGB 14.7 10/26/2016  HCT 41.2 10/26/2016   MCV 92.0 10/26/2016   PLT 225 10/26/2016   NEUTROABS 2.2 10/26/2016   Lab Results  Component Value Date   NA 133 (L) 10/26/2016   K 3.9 10/26/2016   CL 101 09/14/2016   CO2 24 10/26/2016   GLUCOSE 115 10/26/2016   CREATININE 0.8 10/26/2016   CALCIUM 9.5 10/26/2016      RADIOGRAPHY: Ct Chest W Contrast  Result Date: 10/25/2016 CLINICAL DATA:  Right lower lobe lung cancer. EXAM: CT CHEST WITH CONTRAST TECHNIQUE: Multidetector CT imaging of the chest was performed during intravenous contrast administration. CONTRAST:  57m ISOVUE-300 IOPAMIDOL (ISOVUE-300) INJECTION 61% COMPARISON:  08/29/2016. FINDINGS: Cardiovascular: Heart size normal. Trace anterior pericardial fluid again noted. Coronary artery calcification is noted. Atherosclerotic calcification is noted in the wall of the thoracic aorta. Mediastinum/Nodes: No mediastinal  lymphadenopathy. There is no hilar lymphadenopathy. The esophagus has normal imaging features. There is no axillary lymphadenopathy. Lungs/Pleura: Spiculated left upper lobe nodule measures 1.8 x 1.4 cm today compared to 1.2 x 1.2 cm previously. Lesion appears more confluent than on the prior study. The previous identified chronic areas of patchy ground-glass attenuation/architectural distortion show no substantial interval change. Right lung lesion measured previously at 2.6 x 1.7 cm now measures 2.5 x 1.4 cm. One of the dominant lesions in the left lung was measured previously at 2.7 x 1.9 cm and measures 2.6 x 1.7 cm today. No focal airspace consolidation. No pulmonary edema or pleural effusion. Linear scarring noted in the lung bases bilaterally. Stable appearance posterior left lower pleural calcification. Upper Abdomen: Stable appearance tiny hypervascular foci in dome of liver and caudate lobe. Gallbladder surgically absent. Abdominal aortic atherosclerosis noted. Musculoskeletal: Heterogeneous mineralization in the spine likely a combination of osteopenia and degenerative changes. Degenerative changes noted in the right shoulder. Patient is status post left shoulder replacement. Right-sided gynecomastia stable. IMPRESSION: 1. Interval progression of left upper lobe pulmonary nodule, suspicious for recurrent disease. PET-CT may prove helpful to further evaluate. 2. Other scattered areas of ground-glass attenuation/architectural distortion in the lungs bilaterally are stable. 3. Emphysema. 4. Coronary artery and thoracoabdominal aortic atherosclerosis. Electronically Signed   By: EMisty StanleyM.D.   On: 10/25/2016 14:49   Ct Abdomen Pelvis W Contrast  Result Date: 10/21/2016 CLINICAL DATA:  Restaging of right-sided lung cancer. Diagnosed 10/14. Ongoing chemotherapy. EXAM: CT ABDOMEN AND PELVIS WITH CONTRAST TECHNIQUE: Multidetector CT imaging of the abdomen and pelvis was performed using the standard  protocol following bolus administration of intravenous contrast. CONTRAST:  1033mISOVUE-300 IOPAMIDOL (ISOVUE-300) INJECTION 61% COMPARISON:  08/29/2016 FINDINGS: Lower chest: Bibasilar emphysema with left base scarring. Right lower lobe clustered nodules measure maximally 5 mm on image 2/series 6, new. More anterior 3 mm right lower lobe pulmonary nodule is stable on image 8/series 6. Normal heart size. Trace anterior pericardial fluid or thickening is decreased and favored to be physiologic. Hepatobiliary: Tiny hypo attenuating high left liver lobe lesions are unchanged. Hepatic dome 6 mm hyper enhancing focus on image 15/series 2 is unchanged. Probable cyst or minimally complex cyst in the inferior right hepatic lobe is unchanged in size, including at 8 mm on image 34/series 2. There is also a pericholecystic liver cyst of less than a cm in image 30. Cholecystectomy, without biliary ductal dilatation. Minimal pneumobilia in the left hepatic lobe is decreased. Pancreas: Mild pancreatic atrophy. No duct dilatation or acute inflammation. Spleen: Normal in size, without focal abnormality. Adrenals/Urinary Tract: Normal left adrenal gland. Mild right adrenal nodularity  is similar and has been present back to 02/24/14, favoring adenoma. Mild right renal scarring. Normal left kidney, without hydronephrosis. Small bladder diverticular are again identified. Stomach/Bowel: Normal stomach, without wall thickening. Normal colon, appendix, and terminal ileum. Normal small bowel. Vascular/Lymphatic: Advanced aortic and branch vessel atherosclerosis. SMA origin marked stenosis on image 24/series 2, chronic. No abdominopelvic adenopathy. Reproductive: Normal prostate. Other: No significant free fluid. Musculoskeletal: Left ischial sclerosis on image 76/ series 2 is unchanged and favored to be benign. Osteopenia. Left iliac and acetabular sub cm sclerotic lesions are similar and likely bone islands. A right iliac lucent lesion  has been present back to 2015 and can be presumed benign. Posttraumatic or postsurgical deformities of lower right-sided ribs. Advanced lumbosacral spondylosis with Schmorl's node deformities. IMPRESSION: 1. No acute process or evidence of metastatic disease in the abdomen or pelvis. 2. Similar liver lesions, favored to represent benign entities. 3. 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 4. Multiple bladder diverticular, suggesting a component of outlet obstruction. 5. New right lower lobe clustered nodularity since 08/29/2016. Favored to represent infection. Correlate with infectious symptoms. Recommend attention on follow-up. Electronically Signed   By: Abigail Miyamoto M.D.   On: 10/21/2016 16:14      IMPRESSION: Stage IV (T1a, N0, M1b) non-small cell lung cancer of the right lower lobe. We discussed the risks, benefits, and side effects of radiation therapy. We discussed the logistics of radiation for the patient's education. The patient is a good candidate for palliative stereotactic body radiotherapy for the enlarging left upper lobe nodule.  PLAN: I recommend three stereotactic radiation treatments, delivered every other day, over the course of approximately a week and a half. The patient is in agreement and would like to move forward with treatment. He is scheduled for CT simulation and planning this afternoon, with anticipation of beginning treatment next week.     ------------------------------------------------  Blair Promise, PhD, MD  This document serves as a record of services personally performed by Gery Pray, MD. It was created on his behalf by Maryla Morrow, a trained medical scribe. The creation of this record is based on the scribe's personal observations and the provider's statements to them. This document has been checked and approved by the attending provider.

## 2016-11-05 NOTE — Progress Notes (Signed)
  Radiation Oncology         [phone] ________________________________  Name: Anthony Curry MRN: [id number]  Date: 11/01/2016  DOB: 10/11/1941   STEREOTACTIC BODY RADIOTHERAPY SIMULATION AND TREATMENT PLANNING NOTE    DIAGNOSIS:  Stage IV (T1a, N0, M1b) non-small cell lung cancer of the right lower lobe, now with oligometastasis in the left upper lobe  NARRATIVE:  The patient was brought to the [facility].  Identity was confirmed.  All relevant records and images related to the planned course of therapy were reviewed.  The patient freely provided informed written consent to proceed with treatment after reviewing the details related to the planned course of therapy. The consent form was witnessed and verified by the simulation staff.  Then, the patient was set-up in a stable reproducible  supine position for radiation therapy.  A BodyFix immobilization pillow was fabricated for reproducible positioning.  Then I personally applied the abdominal compression paddle to limit respiratory excursion.  4D respiratoy motion management CT images were obtained.  Surface markings were placed.  The CT images were loaded into the planning software.  Then, using Cine, MIP, and standard views, the internal target volume (ITV) and planning target volumes (PTV) were delinieated, and avoidance structures were contoured.  Treatment planning then occurred.  The radiation prescription was entered and confirmed.  A total of two complex treatment devices were fabricated in the form of the BodyFix immobilization pillow and a neck accuform cushion.  I have requested : 3D Simulation  I have requested a DVH of the following structures: Heart, Lungs, Esophagus, Chest Wall, Brachial Plexus, Major Blood Vessels, and targets.  PLAN:  The patient will receive 54 Gy in 3 fractions.  -----------------------------------  Blair Promise, PhD, MD

## 2016-11-09 ENCOUNTER — Other Ambulatory Visit (HOSPITAL_BASED_OUTPATIENT_CLINIC_OR_DEPARTMENT_OTHER): Payer: Medicare Other

## 2016-11-09 ENCOUNTER — Encounter: Payer: Self-pay | Admitting: Internal Medicine

## 2016-11-09 ENCOUNTER — Ambulatory Visit (HOSPITAL_BASED_OUTPATIENT_CLINIC_OR_DEPARTMENT_OTHER): Payer: Medicare Other

## 2016-11-09 ENCOUNTER — Telehealth: Payer: Self-pay | Admitting: Internal Medicine

## 2016-11-09 ENCOUNTER — Ambulatory Visit (HOSPITAL_BASED_OUTPATIENT_CLINIC_OR_DEPARTMENT_OTHER): Payer: Medicare Other | Admitting: Internal Medicine

## 2016-11-09 DIAGNOSIS — C7972 Secondary malignant neoplasm of left adrenal gland: Secondary | ICD-10-CM | POA: Diagnosis not present

## 2016-11-09 DIAGNOSIS — G620 Drug-induced polyneuropathy: Secondary | ICD-10-CM | POA: Diagnosis not present

## 2016-11-09 DIAGNOSIS — C3431 Malignant neoplasm of lower lobe, right bronchus or lung: Secondary | ICD-10-CM

## 2016-11-09 DIAGNOSIS — C3490 Malignant neoplasm of unspecified part of unspecified bronchus or lung: Secondary | ICD-10-CM

## 2016-11-09 DIAGNOSIS — R5383 Other fatigue: Secondary | ICD-10-CM | POA: Diagnosis not present

## 2016-11-09 DIAGNOSIS — G893 Neoplasm related pain (acute) (chronic): Secondary | ICD-10-CM

## 2016-11-09 DIAGNOSIS — Z5112 Encounter for antineoplastic immunotherapy: Secondary | ICD-10-CM | POA: Diagnosis present

## 2016-11-09 DIAGNOSIS — R42 Dizziness and giddiness: Secondary | ICD-10-CM

## 2016-11-09 LAB — CBC WITH DIFFERENTIAL/PLATELET
BASO%: 0.5 % (ref 0.0–2.0)
Basophils Absolute: 0 10*3/uL (ref 0.0–0.1)
EOS%: 6.2 % (ref 0.0–7.0)
Eosinophils Absolute: 0.3 10*3/uL (ref 0.0–0.5)
HEMATOCRIT: 40.8 % (ref 38.4–49.9)
HEMOGLOBIN: 13.8 g/dL (ref 13.0–17.1)
LYMPH#: 1 10*3/uL (ref 0.9–3.3)
LYMPH%: 20.5 % (ref 14.0–49.0)
MCH: 32.6 pg (ref 27.2–33.4)
MCHC: 33.9 g/dL (ref 32.0–36.0)
MCV: 96.3 fL (ref 79.3–98.0)
MONO#: 0.5 10*3/uL (ref 0.1–0.9)
MONO%: 10.7 % (ref 0.0–14.0)
NEUT#: 3.1 10*3/uL (ref 1.5–6.5)
NEUT%: 62.1 % (ref 39.0–75.0)
PLATELETS: 225 10*3/uL (ref 140–400)
RBC: 4.24 10*6/uL (ref 4.20–5.82)
RDW: 13.4 % (ref 11.0–14.6)
WBC: 5 10*3/uL (ref 4.0–10.3)

## 2016-11-09 LAB — COMPREHENSIVE METABOLIC PANEL
ALBUMIN: 3.6 g/dL (ref 3.5–5.0)
ALK PHOS: 98 U/L (ref 40–150)
ALT: 13 U/L (ref 0–55)
ANION GAP: 9 meq/L (ref 3–11)
AST: 20 U/L (ref 5–34)
BILIRUBIN TOTAL: 0.64 mg/dL (ref 0.20–1.20)
BUN: 15.6 mg/dL (ref 7.0–26.0)
CALCIUM: 9.2 mg/dL (ref 8.4–10.4)
CO2: 24 mEq/L (ref 22–29)
CREATININE: 0.8 mg/dL (ref 0.7–1.3)
Chloride: 101 mEq/L (ref 98–109)
EGFR: 87 mL/min/{1.73_m2} — ABNORMAL LOW (ref >90)
Glucose: 115 mg/dl (ref 70–140)
Potassium: 3.9 mEq/L (ref 3.5–5.1)
Sodium: 134 mEq/L — ABNORMAL LOW (ref 136–145)
TOTAL PROTEIN: 6.2 g/dL — AB (ref 6.4–8.3)

## 2016-11-09 MED ORDER — HYDROCODONE-ACETAMINOPHEN 5-325 MG PO TABS: 1.0000 | ORAL_TABLET | ORAL | 0 refills | Status: DC | PRN

## 2016-11-09 MED ORDER — SODIUM CHLORIDE 0.9 % IV SOLN
Freq: Once | INTRAVENOUS | Status: AC
  Administered 2016-11-09: 13:00:00 via INTRAVENOUS

## 2016-11-09 MED ORDER — SODIUM CHLORIDE 0.9 % IV SOLN
240.0000 mg | Freq: Once | INTRAVENOUS | Status: AC
  Administered 2016-11-09: 240 mg via INTRAVENOUS
  Filled 2016-11-09: qty 20

## 2016-11-09 NOTE — Patient Instructions (Signed)
Cancer Center Discharge Instructions for Patients Receiving Chemotherapy  Today you received the following chemotherapy agents Opdivo.  To help prevent nausea and vomiting after your treatment, we encourage you to take your nausea medication as directed.   If you develop nausea and vomiting that is not controlled by your nausea medication, call the clinic.   BELOW ARE SYMPTOMS THAT SHOULD BE REPORTED IMMEDIATELY:  *FEVER GREATER THAN 100.5 F  *CHILLS WITH OR WITHOUT FEVER  NAUSEA AND VOMITING THAT IS NOT CONTROLLED WITH YOUR NAUSEA MEDICATION  *UNUSUAL SHORTNESS OF BREATH  *UNUSUAL BRUISING OR BLEEDING  TENDERNESS IN MOUTH AND THROAT WITH OR WITHOUT PRESENCE OF ULCERS  *URINARY PROBLEMS  *BOWEL PROBLEMS  UNUSUAL RASH Items with * indicate a potential emergency and should be followed up as soon as possible.  Feel free to call the clinic you have any questions or concerns. The clinic phone number is [phone].  Please show the CHEMO ALERT CARD at check-in to the Emergency Department and triage nurse.    

## 2016-11-09 NOTE — Telephone Encounter (Signed)
Appointments scheduled per 11/09/16 los. A copy of the AVS report and appointment schedule was given to the patient, per 11/09/16 los.

## 2016-11-09 NOTE — Progress Notes (Signed)
[place] Telephone:([phone]) [phone]   Fax:(336) [place], [place] [address]  DIAGNOSIS: Stage IV (T1a, N0, M1b) non-small cell lung cancer, adenocarcinoma of the right lower lobe diagnosed in October 2014  Molecular profile: Negative for RET, ALK, BRAF, MET, EGFR.  Positive for ERBB2 A775T, [id number], KRAS G13E, MAP2K1 D67N (see full report).   PRIOR THERAPY: 1) Systemic chemotherapy with carboplatin for AUC of 5 and Alimta 500 mg/M2 every 3 weeks, status post 6 cycles with stable disease. First dose on 10/23/2013. 2) Maintenance systemic chemotherapy with single agent Alimta 500 mg/M2 every 3 weeks. Status post 8 cycles.   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 Sondra Come completed on 12/16/2014.  CURRENT THERAPY: Immunotherapy with Nivolumab 240 MG IV every 2 weeks status post 42 cycles. First cycle was given 01/26/2016.  INTERVAL HISTORY: Anthony Curry 75 y.o. male returns to the clinic today for follow-up visit accompanied by his wife. The patient continues to complain of fatigue and also dizzy spells and peripheral neuropathy. He denied having any significant weight loss or night sweats. He has no fever or chills. He denied having any chest pain, shortness of breath, cough or hemoptysis. He is tolerating his treatment fairly well with no significant adverse effects. He was referred to Dr. Sondra Come and expected to start palliative radiotherapy to the enlarging left upper lobe lung mass soon. He is here today for evaluation before starting cycle #43.  MEDICAL HISTORY: Past Medical History:  Diagnosis Date  . 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[place])   . Adrenal mass ([place])   . Alcoholism in remission ([place])   . Anemia of chronic disease   . 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 Anxiety   . BPH (benign prostatic hyperplasia)   . Cancer ([place])      small cell/lung  . Complication of anesthesia 09/08/2015    JITTERY AFTER GALLBLADDER SURGERY  . GERD (gastroesophageal reflux disease)   . Hypertension   . Neoplastic malignant related fatigue    IV tx. every 2 weeks last9-14-16 (Dr. Earlie Server), radiation last tx. 6 months  . 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 Persistent atrial fibrillation ([place])   . Pulmonary nodules   . Radiation 12/02/14-12/16/14   Left adrenal gland 30 Gy in 10 fractions    ALLERGIES:  has No Known Allergies.  MEDICATIONS:  Current Outpatient Prescriptions  Medication Sig Dispense Refill  . ALPRAZolam (XANAX) 0.25 MG tablet Take 0.25 mg by mouth 3 (three) times daily as needed for anxiety or sleep. And also at night for sleep.    [name] apixaban (ELIQUIS) 5 MG TABS tablet Take 5 mg by mouth 2 (two) times daily.    . Ascorbic Acid (VITAMIN C PO) Take 1,000 mg by mouth daily.     . B Complex-C (SUPER B COMPLEX PO) Take 1 each by mouth daily.     . calcium carbonate (OS-CAL) 600 MG TABS Take 600 mg by mouth daily with breakfast.    . demeclocycline (DECLOMYCIN) 150 MG tablet Take 150 mg by mouth 2 (two) times daily.    [name] diltiazem (CARDIZEM CD) 180 MG 24 hr capsule Take 1 capsule (180 mg total) by mouth daily at 12 noon. 90 capsule 3  . furosemide (LASIX) 40 MG tablet Take 0.5 tablets (20 mg total) by mouth every other day.    [name] HYDROcodone-acetaminophen (NORCO/VICODIN) 5-325  MG tablet Take 1-2 tablets by mouth every 4 (four) hours as needed for moderate pain. 30 tablet 0  . KLOR-CON M20 20 MEQ tablet Take 20 mEq by mouth every morning.     . Lactobacillus (ACIDOPHILUS) CAPS capsule Take 1 capsule by mouth daily.    [name] MAGNESIUM PO Take 400 mg by mouth daily.     . Multiple Vitamin (MULTIVITAMIN WITH MINERALS) TABS Take 1 tablet by mouth daily.    . Nivolumab (OPDIVO IV) Inject as directed every 2 weeks    . Nutritional Supplements (ENSURE COMPLETE PO) Take 1 Can by mouth 2 (two) times daily.    . Omega-3  Fatty Acids (FISH OIL PO) Take 1 tablet by mouth daily.    . pantoprazole (PROTONIX) 40 MG tablet Take 40 mg by mouth every morning.  8  . tamsulosin (FLOMAX) 0.4 MG CAPS capsule Take 0.4 mg by mouth daily at 12 noon.     . thiamine (VITAMIN B-1) 100 MG tablet Take 1 tablet (100 mg total) by mouth daily. 30 tablet 6  . vitamin E 400 UNIT capsule Take 400 Units by mouth daily.     No current facility-administered medications for this visit.    Facility-Administered Medications Ordered in Other Visits  Medication Dose Route Frequency Provider Last Rate Last Dose  . 0.9 %  sodium chloride infusion   Intravenous Once [name], MD        SURGICAL HISTORY:  Past Surgical History:  Procedure Laterality Date  . ANKLE ARTHRODESIS  1995   rt fx-hardware in  . CARDIOVERSION N/A 09/29/2014   Procedure: CARDIOVERSION;  Surgeon: Josue Hector, MD;  Location: [facility] ENDOSCOPY;  Service: Cardiovascular;  Laterality: N/A;  . CHOLECYSTECTOMY  09/08/2015    laproscopic   . CHOLECYSTECTOMY N/A 09/08/2015   Procedure: LAPAROSCOPIC CHOLECYSTECTOMY WITH ATTEMPTED INTRAOPERATIVE CHOLANGIOGRAM ;  Surgeon: Fanny Skates, MD;  Location: [facility];  Service: General;  Laterality: N/A;  . COLONOSCOPY    . ERCP N/A 09/02/2015   Procedure: ENDOSCOPIC RETROGRADE CHOLANGIOPANCREATOGRAPHY (ERCP);  Surgeon: Arta Silence, MD;  Location: [facility] ENDOSCOPY;  Service: Endoscopy;  Laterality: N/A;  . ERCP N/A 09/07/2015   Procedure: ENDOSCOPIC RETROGRADE CHOLANGIOPANCREATOGRAPHY (ERCP);  Surgeon: Clarene Essex, MD;  Location: [facility] ENDOSCOPY;  Service: Endoscopy;  Laterality: N/A;  . EXCISION / CURETTAGE BONE CYST FINGER  2005   rt thumb  . HEMORRHOID SURGERY    . Percutaneous Cholecytostomy tube     IVR insertion 06-13-15(Dr. Vernard Gambles)- 08-28-15 remains in place to drainage bag  . TONSILLECTOMY    . TOTAL SHOULDER ARTHROPLASTY  02/03/2012   Procedure: TOTAL SHOULDER ARTHROPLASTY;  Surgeon: [name], MD;  Location: [facility];  Service: Orthopedics;  Laterality: Left;    REVIEW OF SYSTEMS:  A comprehensive review of systems was negative except for: Constitutional: positive for fatigue Neurological: positive for dizziness and paresthesia   PHYSICAL EXAMINATION: General appearance: alert, cooperative, fatigued and no distress Head: Normocephalic, without obvious abnormality, atraumatic Neck: no adenopathy, no JVD, supple, symmetrical, trachea midline and thyroid not enlarged, symmetric, no tenderness/mass/nodules Lymph nodes: Cervical, supraclavicular, and axillary nodes normal. Resp: clear to auscultation bilaterally Back: symmetric, no curvature. ROM normal. No CVA tenderness. Cardio: regular rate and rhythm, S1, S2 normal, no murmur, click, rub or gallop GI: soft, non-tender; bowel sounds normal; no masses,  no organomegaly Extremities: extremities normal, atraumatic, no cyanosis or edema  ECOG PERFORMANCE STATUS: 1 - Symptomatic but completely ambulatory  Blood pressure 140/65, pulse  70, temperature 97.4 F (36.3 C), temperature source Oral, resp. rate 18, height 5' 11"  (1.803 m), weight 156 lb 14.4 oz (71.2 kg), SpO2 98 %.  LABORATORY DATA: Lab Results  Component Value Date   WBC 5.0 11/09/2016   HGB 13.8 11/09/2016   HCT 40.8 11/09/2016   MCV 96.3 11/09/2016   PLT 225 11/09/2016      Chemistry      Component Value Date/Time   NA 134 (L) 11/09/2016 1020   K 3.9 11/09/2016 1020   CL 101 09/14/2016 1319   CO2 24 11/09/2016 1020   BUN 15.6 11/09/2016 1020   CREATININE 0.8 11/09/2016 1020      Component Value Date/Time   CALCIUM 9.2 11/09/2016 1020   ALKPHOS 98 11/09/2016 1020   AST 20 11/09/2016 1020   ALT 13 11/09/2016 1020   BILITOT 0.64 11/09/2016 1020       RADIOGRAPHIC STUDIES: Ct Chest W Contrast  Result Date: 10/25/2016 CLINICAL DATA:  Right lower lobe lung cancer. EXAM: CT CHEST WITH CONTRAST TECHNIQUE: Multidetector CT imaging of the chest was performed during  intravenous contrast administration. CONTRAST:  24m ISOVUE-300 IOPAMIDOL (ISOVUE-300) INJECTION 61% COMPARISON:  08/29/2016. FINDINGS: Cardiovascular: Heart size normal. Trace anterior pericardial fluid again noted. Coronary artery calcification is noted. Atherosclerotic calcification is noted in the wall of the thoracic aorta. Mediastinum/Nodes: No mediastinal lymphadenopathy. There is no hilar lymphadenopathy. The esophagus has normal imaging features. There is no axillary lymphadenopathy. Lungs/Pleura: Spiculated left upper lobe nodule measures 1.8 x 1.4 cm today compared to 1.2 x 1.2 cm previously. Lesion appears more confluent than on the prior study. The previous identified chronic areas of patchy ground-glass attenuation/architectural distortion show no substantial interval change. Right lung lesion measured previously at 2.6 x 1.7 cm now measures 2.5 x 1.4 cm. One of the dominant lesions in the left lung was measured previously at 2.7 x 1.9 cm and measures 2.6 x 1.7 cm today. No focal airspace consolidation. No pulmonary edema or pleural effusion. Linear scarring noted in the lung bases bilaterally. Stable appearance posterior left lower pleural calcification. Upper Abdomen: Stable appearance tiny hypervascular foci in dome of liver and caudate lobe. Gallbladder surgically absent. Abdominal aortic atherosclerosis noted. Musculoskeletal: Heterogeneous mineralization in the spine likely a combination of osteopenia and degenerative changes. Degenerative changes noted in the right shoulder. Patient is status post left shoulder replacement. Right-sided gynecomastia stable. IMPRESSION: 1. Interval progression of left upper lobe pulmonary nodule, suspicious for recurrent disease. PET-CT may prove helpful to further evaluate. 2. Other scattered areas of ground-glass attenuation/architectural distortion in the lungs bilaterally are stable. 3. Emphysema. 4. Coronary artery and thoracoabdominal aortic atherosclerosis.  Electronically Signed   By: EMisty StanleyM.D.   On: 10/25/2016 14:49   Ct Abdomen Pelvis W Contrast  Result Date: 10/21/2016 CLINICAL DATA:  Restaging of right-sided lung cancer. Diagnosed 10/14. Ongoing chemotherapy. EXAM: CT ABDOMEN AND PELVIS WITH CONTRAST TECHNIQUE: Multidetector CT imaging of the abdomen and pelvis was performed using the standard protocol following bolus administration of intravenous contrast. CONTRAST:  1014mISOVUE-300 IOPAMIDOL (ISOVUE-300) INJECTION 61% COMPARISON:  08/29/2016 FINDINGS: Lower chest: Bibasilar emphysema with left base scarring. Right lower lobe clustered nodules measure maximally 5 mm on image 2/series 6, new. More anterior 3 mm right lower lobe pulmonary nodule is stable on image 8/series 6. Normal heart size. Trace anterior pericardial fluid or thickening is decreased and favored to be physiologic. Hepatobiliary: Tiny hypo attenuating high left liver lobe lesions are unchanged. Hepatic dome 6  mm hyper enhancing focus on image 15/series 2 is unchanged. Probable cyst or minimally complex cyst in the inferior right hepatic lobe is unchanged in size, including at 8 mm on image 34/series 2. There is also a pericholecystic liver cyst of less than a cm in image 30. Cholecystectomy, without biliary ductal dilatation. Minimal pneumobilia in the left hepatic lobe is decreased. Pancreas: Mild pancreatic atrophy. No duct dilatation or acute inflammation. Spleen: Normal in size, without focal abnormality. Adrenals/Urinary Tract: Normal left adrenal gland. Mild right adrenal nodularity is similar and has been present back to 02/24/14, favoring adenoma. Mild right renal scarring. Normal left kidney, without hydronephrosis. Small bladder diverticular are again identified. Stomach/Bowel: Normal stomach, without wall thickening. Normal colon, appendix, and terminal ileum. Normal small bowel. Vascular/Lymphatic: Advanced aortic and branch vessel atherosclerosis. SMA origin marked  stenosis on image 24/series 2, chronic. No abdominopelvic adenopathy. Reproductive: Normal prostate. Other: No significant free fluid. Musculoskeletal: Left ischial sclerosis on image 76/ series 2 is unchanged and favored to be benign. Osteopenia. Left iliac and acetabular sub cm sclerotic lesions are similar and likely bone islands. A right iliac lucent lesion has been present back to 2015 and can be presumed benign. Posttraumatic or postsurgical deformities of lower right-sided ribs. Advanced lumbosacral spondylosis with Schmorl's node deformities. IMPRESSION: 1. No acute process or evidence of metastatic disease in the abdomen or pelvis. 2. Similar liver lesions, favored to represent benign entities. 3. 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 4. Multiple bladder diverticular, suggesting a component of outlet obstruction. 5. New right lower lobe clustered nodularity since 08/29/2016. Favored to represent infection. Correlate with infectious symptoms. Recommend attention on follow-up. Electronically Signed   By: Abigail Miyamoto M.D.   On: 10/21/2016 16:14    ASSESSMENT AND PLAN: This is a very pleasant 75 years old white male with stage IV non-small cell lung cancer, adenocarcinoma. He is currently on treatment with immunotherapy with Nivolumab status post 42 cycles. The patient is tolerating the treatment well with no specific complaints. He continues to have the baseline fatigue and dizzy spells. I recommended for him to proceed with cycle #43 today as scheduled. He will also continue with the palliative radiotherapy under the care of Dr. Sondra Come. He would come back for follow-up visit in 2 weeks for evaluation before the next cycle of his treatment. The patient was advised to call immediately if he has any concerning symptoms in the interval. The patient voices understanding of current disease status and treatment options and is in agreement with  the current care plan.  All questions were answered. The patient knows to call the clinic with any problems, questions or concerns. We can certainly see the patient much sooner if necessary.  I spent 10 minutes counseling the patient face to face. The total time spent in the appointment was 15 minutes.  Disclaimer: This note was dictated with voice recognition software. Similar sounding words can inadvertently be transcribed and may not be corrected upon review.

## 2016-11-14 ENCOUNTER — Ambulatory Visit
Admission: RE | Admit: 2016-11-14 | Discharge: 2016-11-14 | Disposition: A | Discharge status: Home / Self Care | Payer: Medicare Other | Source: Ambulatory Visit | Attending: Radiation Oncology | Admitting: Radiation Oncology

## 2016-11-14 DIAGNOSIS — C3431 Malignant neoplasm of lower lobe, right bronchus or lung: Secondary | ICD-10-CM | POA: Diagnosis not present

## 2016-11-14 DIAGNOSIS — C3412 Malignant neoplasm of upper lobe, left bronchus or lung: Secondary | ICD-10-CM | POA: Diagnosis not present

## 2016-11-14 DIAGNOSIS — Z510 Encounter for antineoplastic radiation therapy: Secondary | ICD-10-CM | POA: Diagnosis not present

## 2016-11-14 NOTE — Progress Notes (Signed)
  Radiation Oncology         [phone] ________________________________  Name: Nicolae Vasek MRN: [id number]  Date: 11/14/2016  DOB: [birth year]  Stereotactic Body Radiotherapy Treatment Procedure Note  NARRATIVE:  Shavon Zenz was brought to the stereotactic radiation treatment machine and placed supine on the CT couch. The patient was set up for stereotactic body radiotherapy on the body fix pillow.  3D TREATMENT PLANNING AND DOSIMETRY:  The patient's radiation plan was reviewed and approved prior to starting treatment.  It showed 3-dimensional radiation distributions overlaid onto the planning CT.  The [facility] for the target structures as well as the organs at risk were reviewed. The documentation of this is filed in the radiation oncology EMR.  SIMULATION VERIFICATION:  The patient underwent CT imaging on the treatment unit.  These were carefully aligned to document that the ablative radiation dose would cover the target volume and maximally spare the nearby organs at risk according to the planned distribution.  SPECIAL TREATMENT PROCEDURE: [name] received high dose ablative stereotactic body radiotherapy to the planned target volume without unforeseen complications. Treatment was delivered uneventfully. The high doses associated with stereotactic body radiotherapy and the significant potential risks require careful treatment set up and patient monitoring constituting a special treatment procedure   STEREOTACTIC TREATMENT MANAGEMENT:  Following delivery, the patient was evaluated clinically. The patient tolerated treatment without significant acute effects, and was discharged to home in stable condition.    PLAN: Continue treatment as planned.  ________________________________  Blair Promise, PhD, MD  This document serves as a record of services personally performed by Gery Pray, MD. It was created on his behalf by Bethann Humble, a trained medical scribe. The creation of this  record is based on the scribe's personal observations and the provider's statements to them. This document has been checked and approved by the attending provider.

## 2016-11-15 ENCOUNTER — Ambulatory Visit: Payer: Medicare Other | Admitting: Radiation Oncology

## 2016-11-16 ENCOUNTER — Ambulatory Visit: Payer: Medicare Other | Admitting: Radiation Oncology

## 2016-11-17 ENCOUNTER — Ambulatory Visit: Payer: Medicare Other | Admitting: Radiation Oncology

## 2016-11-18 ENCOUNTER — Ambulatory Visit
Admission: RE | Admit: 2016-11-18 | Discharge: 2016-11-18 | Disposition: A | Discharge status: Home / Self Care | Payer: Medicare Other | Source: Ambulatory Visit | Attending: Radiation Oncology | Admitting: Radiation Oncology

## 2016-11-18 DIAGNOSIS — Z510 Encounter for antineoplastic radiation therapy: Secondary | ICD-10-CM | POA: Diagnosis not present

## 2016-11-18 DIAGNOSIS — C3431 Malignant neoplasm of lower lobe, right bronchus or lung: Secondary | ICD-10-CM | POA: Diagnosis not present

## 2016-11-21 ENCOUNTER — Ambulatory Visit: Payer: Medicare Other | Admitting: Radiation Oncology

## 2016-11-22 ENCOUNTER — Encounter: Payer: Self-pay | Admitting: Radiation Oncology

## 2016-11-22 ENCOUNTER — Ambulatory Visit
Admission: RE | Admit: 2016-11-22 | Discharge: 2016-11-22 | Disposition: A | Discharge status: Home / Self Care | Payer: Medicare Other | Source: Ambulatory Visit | Attending: Radiation Oncology | Admitting: Radiation Oncology

## 2016-11-22 ENCOUNTER — Ambulatory Visit: Payer: Medicare Other | Admitting: Radiation Oncology

## 2016-11-22 VITALS — BP 126/80 | HR 70 | Temp 98.0°F | Ht 71.0 in | Wt 154.6 lb

## 2016-11-22 DIAGNOSIS — C3431 Malignant neoplasm of lower lobe, right bronchus or lung: Secondary | ICD-10-CM | POA: Diagnosis not present

## 2016-11-22 DIAGNOSIS — C3412 Malignant neoplasm of upper lobe, left bronchus or lung: Secondary | ICD-10-CM

## 2016-11-22 DIAGNOSIS — Z510 Encounter for antineoplastic radiation therapy: Secondary | ICD-10-CM | POA: Diagnosis not present

## 2016-11-22 NOTE — Progress Notes (Signed)
  Radiation Oncology         [phone] ________________________________  Name: Anthony Curry MRN: [id number]  Date: 11/22/2016  DOB: May 22, 1941   Weekly Radiation Therapy Management  DIAGNOSIS:  Stage IV (T1a, N0, M1b) non-small cell lung cancer of the right lower lobe, now with oligometastasis in the left upper lobe  Current Dose: 54 Gy     Planned Dose:  54 Gy  Narrative . . . . . . . . The patient presents for routine under treatment assessment.                                   Anthony Curry has completed treatment with 3 fractions to his left upper lung.  He denies having pain or shortness of breath.  He reports having a cough in the morning with yellow sputum.  He has been given a one month follow up appointment.                                  Set-up films were reviewed.                                 The chart was checked. Physical Findings. . .  vitals were not taken for this visit.. Weight essentially stable. Ambulatory with a walker. Lungs are clear to auscultation bilaterally. Heart has regular rate and rhythm. Impression . . . . . . . The patient has tolerated radiation. Plan . . . . . . . . . . . . The patient has completed radiation and will follow up in 1 month. ________________________________   Blair Promise, PhD, MD  This document serves as a record of services personally performed by Gery Pray, MD. It was created on his behalf by Darcus [name], a trained medical scribe. The creation of this record is based on the scribe's personal observations and the provider's statements to them. This document has been checked and approved by the attending provider.

## 2016-11-22 NOTE — Progress Notes (Signed)
Anthony Curry has completed treatment with 3 fractions to his left upper lung.  He denies having pain or shortness of breath.  He reports having a cough in the morning with yellow sputum.  He has been given a one month follow up appointment.  BP 126/80 (BP Location: Left Arm, Patient Position: Sitting)   Pulse 70   Temp 98 F (36.7 C) (Oral)   Ht '5\' 11"'$  (1.803 m)   Wt 154 lb 9.6 oz (70.1 kg)   SpO2 100%   BMI 21.56 kg/m    Wt Readings from Last 3 Encounters:  11/22/16 154 lb 9.6 oz (70.1 kg)  11/09/16 156 lb 14.4 oz (71.2 kg)  11/01/16 151 lb 9.6 oz (68.8 kg)

## 2016-11-22 NOTE — Progress Notes (Signed)
  Radiation Oncology         [phone] ________________________________  Name: Anthony Curry MRN: [id number]  Date: 11/22/2016  DOB: September 16, 1941  Stereotactic Body Radiotherapy Treatment Procedure Note  NARRATIVE:  Anthony Curry was brought to the stereotactic radiation treatment machine and placed supine on the CT couch. The patient was set up for stereotactic body radiotherapy on the body fix pillow.  3D TREATMENT PLANNING AND DOSIMETRY:  The patient's radiation plan was reviewed and approved prior to starting treatment.  It showed 3-dimensional radiation distributions overlaid onto the planning CT.  The [facility] for the target structures as well as the organs at risk were reviewed. The documentation of this is filed in the radiation oncology EMR.  SIMULATION VERIFICATION:  The patient underwent CT imaging on the treatment unit.  These were carefully aligned to document that the ablative radiation dose would cover the target volume and maximally spare the nearby organs at risk according to the planned distribution.  SPECIAL TREATMENT PROCEDURE: Anthony Curry received high dose ablative stereotactic body radiotherapy to the planned target volume without unforeseen complications. Treatment was delivered uneventfully. The high doses associated with stereotactic body radiotherapy and the significant potential risks require careful treatment set up and patient monitoring constituting a special treatment procedure   STEREOTACTIC TREATMENT MANAGEMENT:  Following delivery, the patient was evaluated clinically. The patient tolerated treatment without significant acute effects, and was discharged to home in stable condition.    PLAN: Continue treatment as planned.  ________________________________  Blair Promise, PhD, MD

## 2016-11-22 NOTE — Progress Notes (Signed)
  Radiation Oncology         [phone] ________________________________  Name: Anthony Curry MRN: [id number]  Date: 11/22/2016  DOB: [birth year]  End of Treatment Note  Diagnosis:   Stage IV (T1a, N0, M1b) non-small cell lung cancer of the right lower lobe, now with oligometastasis in the left upper lobe     Indication for treatment:  Palliative       Radiation treatment dates:   11/14/16, 11/18/16, 11/22/16  Site/dose:   Left upper lung: 54 Gy in 3 fractions  Beams/energy:   SBRT/SRT-3D // 6FFF Photon  Narrative: The patient tolerated radiation treatment relatively well. The patient denies pain or shortness of breath. He reports having a cough with yellow sputum. Fatigue level unchanged.  Plan: The patient has completed radiation treatment. The patient will return to radiation oncology clinic for routine followup in one month. I advised them to call or return sooner if they have any questions or concerns related to their recovery or treatment.  -----------------------------------  Blair Promise, PhD, MD  This document serves as a record of services personally performed by Gery Pray, MD. It was created on his behalf by Darcus [name], a trained medical scribe. The creation of this record is based on the scribe's personal observations and the provider's statements to them. This document has been checked and approved by the attending provider.

## 2016-11-23 ENCOUNTER — Encounter: Payer: Self-pay | Admitting: Internal Medicine

## 2016-11-23 ENCOUNTER — Ambulatory Visit (HOSPITAL_BASED_OUTPATIENT_CLINIC_OR_DEPARTMENT_OTHER): Payer: Medicare Other

## 2016-11-23 ENCOUNTER — Other Ambulatory Visit (HOSPITAL_BASED_OUTPATIENT_CLINIC_OR_DEPARTMENT_OTHER): Payer: Medicare Other

## 2016-11-23 ENCOUNTER — Ambulatory Visit (HOSPITAL_BASED_OUTPATIENT_CLINIC_OR_DEPARTMENT_OTHER): Payer: Medicare Other | Admitting: Internal Medicine

## 2016-11-23 VITALS — BP 133/56 | HR 65 | Temp 98.0°F | Resp 18 | Ht 71.0 in | Wt 156.0 lb

## 2016-11-23 DIAGNOSIS — R5383 Other fatigue: Secondary | ICD-10-CM | POA: Diagnosis not present

## 2016-11-23 DIAGNOSIS — Z5112 Encounter for antineoplastic immunotherapy: Secondary | ICD-10-CM | POA: Diagnosis present

## 2016-11-23 DIAGNOSIS — C3431 Malignant neoplasm of lower lobe, right bronchus or lung: Secondary | ICD-10-CM

## 2016-11-23 DIAGNOSIS — C7972 Secondary malignant neoplasm of left adrenal gland: Secondary | ICD-10-CM

## 2016-11-23 LAB — COMPREHENSIVE METABOLIC PANEL
ALBUMIN: 3.8 g/dL (ref 3.5–5.0)
ALK PHOS: 94 U/L (ref 40–150)
ALT: 14 U/L (ref 0–55)
ANION GAP: 9 meq/L (ref 3–11)
AST: 29 U/L (ref 5–34)
BILIRUBIN TOTAL: 0.95 mg/dL (ref 0.20–1.20)
BUN: 19.3 mg/dL (ref 7.0–26.0)
CALCIUM: 9.1 mg/dL (ref 8.4–10.4)
CHLORIDE: 99 meq/L (ref 98–109)
CO2: 24 mEq/L (ref 22–29)
CREATININE: 0.8 mg/dL (ref 0.7–1.3)
EGFR: 88 mL/min/{1.73_m2} — ABNORMAL LOW (ref >90)
Glucose: 87 mg/dl (ref 70–140)
Potassium: 4.2 mEq/L (ref 3.5–5.1)
Sodium: 132 mEq/L — ABNORMAL LOW (ref 136–145)
Total Protein: 6.3 g/dL — ABNORMAL LOW (ref 6.4–8.3)

## 2016-11-23 LAB — CBC WITH DIFFERENTIAL/PLATELET
BASO%: 0.6 % (ref 0.0–2.0)
BASOS ABS: 0 10*3/uL (ref 0.0–0.1)
EOS%: 7.1 % — AB (ref 0.0–7.0)
Eosinophils Absolute: 0.2 10*3/uL (ref 0.0–0.5)
HEMATOCRIT: 40.7 % (ref 38.4–49.9)
HEMOGLOBIN: 13.8 g/dL (ref 13.0–17.1)
LYMPH#: 0.9 10*3/uL (ref 0.9–3.3)
LYMPH%: 26 % (ref 14.0–49.0)
MCH: 32.3 pg (ref 27.2–33.4)
MCHC: 33.9 g/dL (ref 32.0–36.0)
MCV: 95.4 fL (ref 79.3–98.0)
MONO#: 0.5 10*3/uL (ref 0.1–0.9)
MONO%: 14.3 % — ABNORMAL HIGH (ref 0.0–14.0)
NEUT#: 1.7 10*3/uL (ref 1.5–6.5)
NEUT%: 52 % (ref 39.0–75.0)
PLATELETS: 215 10*3/uL (ref 140–400)
RBC: 4.27 10*6/uL (ref 4.20–5.82)
RDW: 13.7 % (ref 11.0–14.6)
WBC: 3.3 10*3/uL — ABNORMAL LOW (ref 4.0–10.3)

## 2016-11-23 MED ORDER — SODIUM CHLORIDE 0.9 % IV SOLN
240.0000 mg | Freq: Once | INTRAVENOUS | Status: AC
  Administered 2016-11-23: 240 mg via INTRAVENOUS
  Filled 2016-11-23: qty 20

## 2016-11-23 MED ORDER — SODIUM CHLORIDE 0.9 % IV SOLN
Freq: Once | INTRAVENOUS | Status: AC
  Administered 2016-11-23: 13:00:00 via INTRAVENOUS

## 2016-11-23 NOTE — Progress Notes (Signed)
[facility] [phone]   Fax:(336) [place], [place] [address]  DIAGNOSIS: Stage IV (T1a, N0, M1b) non-small cell lung cancer, adenocarcinoma of the right lower lobe diagnosed in October 2014  Molecular profile: Negative for RET, ALK, BRAF, MET, EGFR.  Positive for ERBB2 A775T, [id number], KRAS G13E, MAP2K1 D67N (see full report).   PRIOR THERAPY: 1) Systemic chemotherapy with carboplatin for AUC of 5 and Alimta 500 mg/M2 every 3 weeks, status post 6 cycles with stable disease. First dose on 10/23/2013. 2) Maintenance systemic chemotherapy with single agent Alimta 500 mg/M2 every 3 weeks. Status post 8 cycles.   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 Sondra Come completed on 12/16/2014. 4) stereotactic radiotherapy to the enlarging right upper lobe lung nodule under the care of Dr. Sondra Come completed on 11/22/2016  CURRENT THERAPY: Immunotherapy with Nivolumab 240 MG IV every 2 weeks status post 43 cycles. First cycle was given 01/26/2016.  INTERVAL HISTORY: Anthony Curry 75 y.o. male came to the clinic today accompanied by his wife. The patient is feeling fine today with no specific complaints except for fatigue. He recently underwent SBRT to the enlarging left upper lobe lung nodule completed 11/22/2016 under the care of Dr. Sondra Come. He tolerated the treatment well except for the fatigue. He denied having any significant chest pain, shortness of breath, cough or hemoptysis. He has no fever or chills. He has no nausea or vomiting. He is here today for evaluation before starting cycle #44 of his immunotherapy.  MEDICAL HISTORY: Past Medical History:  Diagnosis Date  . 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 Adrenal mass ([place])   . Alcoholism in remission ([place])   . Anemia of chronic disease   . Antineoplastic chemotherapy induced anemia(285.3)     . Anxiety   . BPH (benign prostatic hyperplasia)   . Cancer ([place])    small cell/lung  . Complication of anesthesia 09/08/2015    JITTERY AFTER GALLBLADDER SURGERY  . GERD (gastroesophageal reflux disease)   . Hypertension   . Neoplastic malignant related fatigue    IV tx. every 2 weeks last9-14-16 (Dr. Earlie Server), radiation last tx. 6 months  . 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 Persistent atrial fibrillation ([place])   . Pulmonary nodules   . Radiation 12/02/14-12/16/14   Left adrenal gland 30 Gy in 10 fractions    ALLERGIES:  has No Known Allergies.  MEDICATIONS:  Current Outpatient Prescriptions  Medication Sig Dispense Refill  . ALPRAZolam (XANAX) 0.25 MG tablet Take 0.25 mg by mouth 3 (three) times daily as needed for anxiety or sleep. And also at night for sleep.    [name] apixaban (ELIQUIS) 5 MG TABS tablet Take 5 mg by mouth 2 (two) times daily.    . Ascorbic Acid (VITAMIN C PO) Take 1,000 mg by mouth daily.     . B Complex-C (SUPER B COMPLEX PO) Take 1 each by mouth daily.     . calcium carbonate (OS-CAL) 600 MG TABS Take 600 mg by mouth daily with breakfast.    . demeclocycline (DECLOMYCIN) 150 MG tablet Take 150 mg by mouth 2 (two) times daily.    [name] diltiazem (CARDIZEM CD) 180 MG 24 hr capsule Take 1 capsule (180 mg total) by mouth daily at 12 noon. 90 capsule 3  . furosemide (LASIX) 40 MG tablet Take 0.5 tablets (20 mg total)  by mouth every other day.    [name] HYDROcodone-acetaminophen (NORCO/VICODIN) 5-325 MG tablet Take 1-2 tablets by mouth every 4 (four) hours as needed for moderate pain. 30 tablet 0  . KLOR-CON M20 20 MEQ tablet Take 20 mEq by mouth every morning.     . Lactobacillus (ACIDOPHILUS) CAPS capsule Take 1 capsule by mouth daily.    [name] MAGNESIUM PO Take 400 mg by mouth daily.     . Multiple Vitamin (MULTIVITAMIN WITH MINERALS) TABS Take 1 tablet by mouth daily.    . Nivolumab (OPDIVO IV) Inject as directed every 2 weeks    . Nutritional Supplements (ENSURE  COMPLETE PO) Take 1 Can by mouth 2 (two) times daily.    . Omega-3 Fatty Acids (FISH OIL PO) Take 1 tablet by mouth daily.    . pantoprazole (PROTONIX) 40 MG tablet Take 40 mg by mouth every morning.  8  . tamsulosin (FLOMAX) 0.4 MG CAPS capsule Take 0.4 mg by mouth daily at 12 noon.     . thiamine (VITAMIN B-1) 100 MG tablet Take 1 tablet (100 mg total) by mouth daily. 30 tablet 6  . vitamin E 400 UNIT capsule Take 400 Units by mouth daily.     No current facility-administered medications for this visit.    Facility-Administered Medications Ordered in Other Visits  Medication Dose Route Frequency Provider Last Rate Last Dose  . 0.9 %  sodium chloride infusion   Intravenous Once [name], MD        SURGICAL HISTORY:  Past Surgical History:  Procedure Laterality Date  . ANKLE ARTHRODESIS  1995   rt fx-hardware in  . CARDIOVERSION N/A 09/29/2014   Procedure: CARDIOVERSION;  Surgeon: Josue Hector, MD;  Location: [facility] ENDOSCOPY;  Service: Cardiovascular;  Laterality: N/A;  . CHOLECYSTECTOMY  09/08/2015    laproscopic   . CHOLECYSTECTOMY N/A 09/08/2015   Procedure: LAPAROSCOPIC CHOLECYSTECTOMY WITH ATTEMPTED INTRAOPERATIVE CHOLANGIOGRAM ;  Surgeon: Fanny Skates, MD;  Location: [facility];  Service: General;  Laterality: N/A;  . COLONOSCOPY    . ERCP N/A 09/02/2015   Procedure: ENDOSCOPIC RETROGRADE CHOLANGIOPANCREATOGRAPHY (ERCP);  Surgeon: Arta Silence, MD;  Location: [facility] ENDOSCOPY;  Service: Endoscopy;  Laterality: N/A;  . ERCP N/A 09/07/2015   Procedure: ENDOSCOPIC RETROGRADE CHOLANGIOPANCREATOGRAPHY (ERCP);  Surgeon: Clarene Essex, MD;  Location: [facility] ENDOSCOPY;  Service: Endoscopy;  Laterality: N/A;  . EXCISION / CURETTAGE BONE CYST FINGER  2005   rt thumb  . HEMORRHOID SURGERY    . Percutaneous Cholecytostomy tube     IVR insertion 06-13-15(Dr. Vernard Gambles)- 08-28-15 remains in place to drainage bag  . TONSILLECTOMY    . TOTAL SHOULDER ARTHROPLASTY  02/03/2012   Procedure: TOTAL SHOULDER  ARTHROPLASTY;  Surgeon: [name], MD;  Location: [facility];  Service: Orthopedics;  Laterality: Left;    REVIEW OF SYSTEMS:  A comprehensive review of systems was negative except for: Constitutional: positive for fatigue   PHYSICAL EXAMINATION: General appearance: alert, cooperative, fatigued and no distress Head: Normocephalic, without obvious abnormality, atraumatic Neck: no adenopathy, no JVD, supple, symmetrical, trachea midline and thyroid not enlarged, symmetric, no tenderness/mass/nodules Lymph nodes: Cervical, supraclavicular, and axillary nodes normal. Resp: clear to auscultation bilaterally Back: symmetric, no curvature. ROM normal. No CVA tenderness. Cardio: regular rate and rhythm, S1, S2 normal, no murmur, click, rub or gallop GI: soft, non-tender; bowel sounds normal; no masses,  no organomegaly Extremities: extremities normal, atraumatic, no cyanosis or edema  ECOG PERFORMANCE STATUS: 1 - Symptomatic but completely  ambulatory  Blood pressure (!) 133/56, pulse 65, temperature 98 F (36.7 C), temperature source Oral, resp. rate 18, height _0  (1.803 m), weight 156 lb (70.8 kg), SpO2 98 %.  LABORATORY DATA: Lab Results  Component Value Date   WBC 3.3 (L) 11/23/2016   HGB 13.8 11/23/2016   HCT 40.7 11/23/2016   MCV 95.4 11/23/2016   PLT 215 11/23/2016      Chemistry      Component Value Date/Time   NA 132 (L) 11/23/2016 1104   K 4.2 11/23/2016 1104   CL 101 09/14/2016 1319   CO2 24 11/23/2016 1104   BUN 19.3 11/23/2016 1104   CREATININE 0.8 11/23/2016 1104      Component Value Date/Time   CALCIUM 9.1 11/23/2016 1104   ALKPHOS 94 11/23/2016 1104   AST 29 11/23/2016 1104   ALT 14 11/23/2016 1104   BILITOT 0.95 11/23/2016 1104       RADIOGRAPHIC STUDIES: Ct Chest W Contrast  Result Date: 10/25/2016 CLINICAL DATA:  Right lower lobe lung cancer. EXAM: CT CHEST WITH CONTRAST TECHNIQUE: Multidetector CT imaging of the chest was  performed during intravenous contrast administration. CONTRAST:  23m ISOVUE-300 IOPAMIDOL (ISOVUE-300) INJECTION 61% COMPARISON:  08/29/2016. FINDINGS: Cardiovascular: Heart size normal. Trace anterior pericardial fluid again noted. Coronary artery calcification is noted. Atherosclerotic calcification is noted in the wall of the thoracic aorta. Mediastinum/Nodes: No mediastinal lymphadenopathy. There is no hilar lymphadenopathy. The esophagus has normal imaging features. There is no axillary lymphadenopathy. Lungs/Pleura: Spiculated left upper lobe nodule measures 1.8 x 1.4 cm today compared to 1.2 x 1.2 cm previously. Lesion appears more confluent than on the prior study. The previous identified chronic areas of patchy ground-glass attenuation/architectural distortion show no substantial interval change. Right lung lesion measured previously at 2.6 x 1.7 cm now measures 2.5 x 1.4 cm. One of the dominant lesions in the left lung was measured previously at 2.7 x 1.9 cm and measures 2.6 x 1.7 cm today. No focal airspace consolidation. No pulmonary edema or pleural effusion. Linear scarring noted in the lung bases bilaterally. Stable appearance posterior left lower pleural calcification. Upper Abdomen: Stable appearance tiny hypervascular foci in dome of liver and caudate lobe. Gallbladder surgically absent. Abdominal aortic atherosclerosis noted. Musculoskeletal: Heterogeneous mineralization in the spine likely a combination of osteopenia and degenerative changes. Degenerative changes noted in the right shoulder. Patient is status post left shoulder replacement. Right-sided gynecomastia stable. IMPRESSION: 1. Interval progression of left upper lobe pulmonary nodule, suspicious for recurrent disease. PET-CT may prove helpful to further evaluate. 2. Other scattered areas of ground-glass attenuation/architectural distortion in the lungs bilaterally are stable. 3. Emphysema. 4. Coronary artery and thoracoabdominal aortic  atherosclerosis. Electronically Signed   By: EMisty StanleyM.D.   On: 10/25/2016 14:49    ASSESSMENT AND PLAN: This is a very pleasant 75years old white male with a stage IV non-small cell lung cancer, adenocarcinoma status post several chemotherapy regimens including carboplatin and Alimta followed by maintenance Alimta and currently undergoing treatment with immunotherapy with Nivolumab status post 43 cycles.  The patient is doing fine today with no specific complaints except for fatigue. I recommended for him to proceed with cycle #44 of his treatment as scheduled. He would come back for follow-up visit in 2 weeks for evaluation before starting the next cycle of his treatment with immunotherapy. For the fatigue, I will continue to monitor his TSH closely. He was advised to call immediately if he has any concerning symptoms in  the interval. The patient voices understanding of current disease status and treatment options and is in agreement with the current care plan.  All questions were answered. The patient knows to call the clinic with any problems, questions or concerns. We can certainly see the patient much sooner if necessary. I spent 10 minutes counseling the patient face to face. The total time spent in the appointment was 15 minutes.  Disclaimer: This note was dictated with voice recognition software. Similar sounding words can inadvertently be transcribed and may not be corrected upon review.

## 2016-11-23 NOTE — Patient Instructions (Signed)
[facility] Discharge Instructions for Patients Receiving Chemotherapy  Today you received the following chemotherapy agents Opdivo.  To help prevent nausea and vomiting after your treatment, we encourage you to take your nausea medication as directed.   If you develop nausea and vomiting that is not controlled by your nausea medication, call the clinic.   BELOW ARE SYMPTOMS THAT SHOULD BE REPORTED IMMEDIATELY:  *FEVER GREATER THAN 100.5 F  *CHILLS WITH OR WITHOUT FEVER  NAUSEA AND VOMITING THAT IS NOT CONTROLLED WITH YOUR NAUSEA MEDICATION  *UNUSUAL SHORTNESS OF BREATH  *UNUSUAL BRUISING OR BLEEDING  TENDERNESS IN MOUTH AND THROAT WITH OR WITHOUT PRESENCE OF ULCERS  *URINARY PROBLEMS  *BOWEL PROBLEMS  UNUSUAL RASH Items with * indicate a potential emergency and should be followed up as soon as possible.  Feel free to call the clinic you have any questions or concerns. The clinic phone number is [phone].  Please show the CHEMO ALERT CARD at check-in to the Emergency Department and triage nurse.    

## 2016-11-24 ENCOUNTER — Ambulatory Visit: Payer: Medicare Other | Admitting: Radiation Oncology

## 2016-12-07 ENCOUNTER — Ambulatory Visit (HOSPITAL_BASED_OUTPATIENT_CLINIC_OR_DEPARTMENT_OTHER): Payer: Medicare Other

## 2016-12-07 ENCOUNTER — Ambulatory Visit (HOSPITAL_BASED_OUTPATIENT_CLINIC_OR_DEPARTMENT_OTHER): Payer: Medicare Other | Admitting: Internal Medicine

## 2016-12-07 ENCOUNTER — Other Ambulatory Visit (HOSPITAL_BASED_OUTPATIENT_CLINIC_OR_DEPARTMENT_OTHER): Payer: Medicare Other

## 2016-12-07 ENCOUNTER — Other Ambulatory Visit: Payer: Self-pay | Admitting: Internal Medicine

## 2016-12-07 ENCOUNTER — Encounter: Payer: Self-pay | Admitting: Internal Medicine

## 2016-12-07 VITALS — BP 169/68 | HR 75 | Temp 97.5°F | Resp 18 | Wt 152.2 lb

## 2016-12-07 DIAGNOSIS — Z5112 Encounter for antineoplastic immunotherapy: Secondary | ICD-10-CM

## 2016-12-07 DIAGNOSIS — I10 Essential (primary) hypertension: Secondary | ICD-10-CM

## 2016-12-07 DIAGNOSIS — C7972 Secondary malignant neoplasm of left adrenal gland: Secondary | ICD-10-CM

## 2016-12-07 DIAGNOSIS — E039 Hypothyroidism, unspecified: Secondary | ICD-10-CM

## 2016-12-07 DIAGNOSIS — R5383 Other fatigue: Secondary | ICD-10-CM | POA: Diagnosis not present

## 2016-12-07 DIAGNOSIS — C3431 Malignant neoplasm of lower lobe, right bronchus or lung: Secondary | ICD-10-CM

## 2016-12-07 DIAGNOSIS — H353221 Exudative age-related macular degeneration, left eye, with active choroidal neovascularization: Secondary | ICD-10-CM | POA: Diagnosis not present

## 2016-12-07 HISTORY — DX: Essential (primary) hypertension: I10

## 2016-12-07 LAB — COMPREHENSIVE METABOLIC PANEL
ALBUMIN: 4.1 g/dL (ref 3.5–5.0)
ALK PHOS: 113 U/L (ref 40–150)
ALT: 14 U/L (ref 0–55)
ANION GAP: 11 meq/L (ref 3–11)
AST: 22 U/L (ref 5–34)
BILIRUBIN TOTAL: 0.85 mg/dL (ref 0.20–1.20)
BUN: 18.4 mg/dL (ref 7.0–26.0)
CALCIUM: 10.3 mg/dL (ref 8.4–10.4)
CO2: 27 mEq/L (ref 22–29)
CREATININE: 0.8 mg/dL (ref 0.7–1.3)
Chloride: 100 mEq/L (ref 98–109)
EGFR: 84 mL/min/{1.73_m2} — AB (ref >90)
Glucose: 84 mg/dl (ref 70–140)
Potassium: 3.9 mEq/L (ref 3.5–5.1)
Sodium: 137 mEq/L (ref 136–145)
TOTAL PROTEIN: 6.5 g/dL (ref 6.4–8.3)

## 2016-12-07 LAB — CBC WITH DIFFERENTIAL/PLATELET
BASO%: 0.4 % (ref 0.0–2.0)
Basophils Absolute: 0 10*3/uL (ref 0.0–0.1)
EOS ABS: 0.4 10*3/uL (ref 0.0–0.5)
EOS%: 7.1 % — ABNORMAL HIGH (ref 0.0–7.0)
HCT: 43.1 % (ref 38.4–49.9)
HGB: 15.3 g/dL (ref 13.0–17.1)
LYMPH#: 1.1 10*3/uL (ref 0.9–3.3)
LYMPH%: 22.4 % (ref 14.0–49.0)
MCH: 33.1 pg (ref 27.2–33.4)
MCHC: 35.5 g/dL (ref 32.0–36.0)
MCV: 93.3 fL (ref 79.3–98.0)
MONO#: 0.8 10*3/uL (ref 0.1–0.9)
MONO%: 15.5 % — ABNORMAL HIGH (ref 0.0–14.0)
NEUT%: 54.6 % (ref 39.0–75.0)
NEUTROS ABS: 2.7 10*3/uL (ref 1.5–6.5)
PLATELETS: 179 10*3/uL (ref 140–400)
RBC: 4.62 10*6/uL (ref 4.20–5.82)
RDW: 14.4 % (ref 11.0–14.6)
WBC: 4.9 10*3/uL (ref 4.0–10.3)

## 2016-12-07 MED ORDER — SODIUM CHLORIDE 0.9 % IV SOLN
Freq: Once | INTRAVENOUS | Status: AC
  Administered 2016-12-07: 13:00:00 via INTRAVENOUS

## 2016-12-07 MED ORDER — SODIUM CHLORIDE 0.9 % IV SOLN
240.0000 mg | Freq: Once | INTRAVENOUS | Status: AC
  Administered 2016-12-07: 240 mg via INTRAVENOUS
  Filled 2016-12-07: qty 20

## 2016-12-07 NOTE — Progress Notes (Signed)
[facility] Telephone:([phone]   Fax:(336) [place] [address]  DIAGNOSIS: Stage IV (T1a, N0, M1b) non-small cell lung cancer, adenocarcinoma of the right lower lobe diagnosed in October 2014  Molecular profile: Negative for RET, ALK, BRAF, MET, EGFR.  Positive for ERBB2 A775T, [id number], KRAS G13E, MAP2K1 D67N (see full report).   PRIOR THERAPY: 1) Systemic chemotherapy with carboplatin for AUC of 5 and Alimta 500 mg/M2 every 3 weeks, status post 6 cycles with stable disease. First dose on 10/23/2013. 2) Maintenance systemic chemotherapy with single agent Alimta 500 mg/M2 every 3 weeks. Status post 8 cycles.   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 Sondra Come completed on 12/16/2014. 4) stereotactic radiotherapy to the enlarging right upper lobe lung nodule under the care of Dr. Sondra Come completed on 11/22/2016  CURRENT THERAPY: Immunotherapy with Nivolumab 240 MG IV every 2 weeks status post 44 cycles. First cycle was given 01/26/2016.  INTERVAL HISTORY: Anthony Curry 76 y.o. male returns to the clinic today for follow-up visit. His wife was sick at home and she did not come with him this time. He is feeling well today with no specific complaints. He tolerated the last cycle of his treatment with immunotherapy fairly well. He continues to have fatigue. He denied having any chest pain, shortness of breath except with exertion, no cough or hemoptysis. He has no fever or chills. He denied having any nausea, vomiting, diarrhea or constipation. The patient is here today for evaluation before starting cycle #45 of his treatment.  MEDICAL HISTORY: Past Medical History:  Diagnosis Date  . 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 Adrenal mass ([place])   . Alcoholism in remission ([place])   . Anemia of chronic disease   . Antineoplastic chemotherapy induced  anemia(285.3)   . Anxiety   . BPH (benign prostatic hyperplasia)   . Cancer ([place])    small cell/lung  . Complication of anesthesia 09/08/2015    JITTERY AFTER GALLBLADDER SURGERY  . GERD (gastroesophageal reflux disease)   . Hypertension   . Neoplastic malignant related fatigue    IV tx. every 2 weeks last9-14-16 (Dr. Earlie Server), radiation last tx. 6 months  . 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 Persistent atrial fibrillation ([place])   . Pulmonary nodules   . Radiation 12/02/14-12/16/14   Left adrenal gland 30 Gy in 10 fractions    ALLERGIES:  has No Known Allergies.  MEDICATIONS:  Current Outpatient Prescriptions  Medication Sig Dispense Refill  . ALPRAZolam (XANAX) 0.25 MG tablet Take 0.25 mg by mouth 3 (three) times daily as needed for anxiety or sleep. And also at night for sleep.    [name] apixaban (ELIQUIS) 5 MG TABS tablet Take 5 mg by mouth 2 (two) times daily.    . Ascorbic Acid (VITAMIN C PO) Take 1,000 mg by mouth daily.     . B Complex-C (SUPER B COMPLEX PO) Take 1 each by mouth daily.     . calcium carbonate (OS-CAL) 600 MG TABS Take 600 mg by mouth daily with breakfast.    . demeclocycline (DECLOMYCIN) 150 MG tablet Take 150 mg by mouth 2 (two) times daily.    [name] diltiazem (CARDIZEM CD) 180 MG 24 hr capsule Take 1 capsule (180 mg total) by mouth daily at 12 noon. 90 capsule 3  . furosemide (LASIX) 40 MG tablet Take 0.5  tablets (20 mg total) by mouth every other day.    [name] HYDROcodone-acetaminophen (NORCO/VICODIN) 5-325 MG tablet Take 1-2 tablets by mouth every 4 (four) hours as needed for moderate pain. 30 tablet 0  . KLOR-CON M20 20 MEQ tablet Take 20 mEq by mouth every morning.     . Lactobacillus (ACIDOPHILUS) CAPS capsule Take 1 capsule by mouth daily.    [name] MAGNESIUM PO Take 400 mg by mouth daily.     . Multiple Vitamin (MULTIVITAMIN WITH MINERALS) TABS Take 1 tablet by mouth daily.    . Nivolumab (OPDIVO IV) Inject as directed every 2 weeks    . Nutritional  Supplements (ENSURE COMPLETE PO) Take 1 Can by mouth 2 (two) times daily.    . Omega-3 Fatty Acids (FISH OIL PO) Take 1 tablet by mouth daily.    . pantoprazole (PROTONIX) 40 MG tablet Take 40 mg by mouth every morning.  8  . tamsulosin (FLOMAX) 0.4 MG CAPS capsule Take 0.4 mg by mouth daily at 12 noon.     . thiamine (VITAMIN B-1) 100 MG tablet Take 1 tablet (100 mg total) by mouth daily. 30 tablet 6  . vitamin E 400 UNIT capsule Take 400 Units by mouth daily.     No current facility-administered medications for this visit.    Facility-Administered Medications Ordered in Other Visits  Medication Dose Route Frequency Provider Last Rate Last Dose  . 0.9 %  sodium chloride infusion   Intravenous Once [name], MD        SURGICAL HISTORY:  Past Surgical History:  Procedure Laterality Date  . ANKLE ARTHRODESIS  1995   rt fx-hardware in  . CARDIOVERSION N/A 09/29/2014   Procedure: CARDIOVERSION;  Surgeon: Josue Hector, MD;  Location: [facility] ENDOSCOPY;  Service: Cardiovascular;  Laterality: N/A;  . CHOLECYSTECTOMY  09/08/2015    laproscopic   . CHOLECYSTECTOMY N/A 09/08/2015   Procedure: LAPAROSCOPIC CHOLECYSTECTOMY WITH ATTEMPTED INTRAOPERATIVE CHOLANGIOGRAM ;  Surgeon: Fanny Skates, MD;  Location: [facility];  Service: General;  Laterality: N/A;  . COLONOSCOPY    . ERCP N/A 09/02/2015   Procedure: ENDOSCOPIC RETROGRADE CHOLANGIOPANCREATOGRAPHY (ERCP);  Surgeon: Arta Silence, MD;  Location: [facility] ENDOSCOPY;  Service: Endoscopy;  Laterality: N/A;  . ERCP N/A 09/07/2015   Procedure: ENDOSCOPIC RETROGRADE CHOLANGIOPANCREATOGRAPHY (ERCP);  Surgeon: Clarene Essex, MD;  Location: [facility] ENDOSCOPY;  Service: Endoscopy;  Laterality: N/A;  . EXCISION / CURETTAGE BONE CYST FINGER  2005   rt thumb  . HEMORRHOID SURGERY    . Percutaneous Cholecytostomy tube     IVR insertion 06-13-15(Dr. Vernard Gambles)- 08-28-15 remains in place to drainage bag  . TONSILLECTOMY    . TOTAL SHOULDER ARTHROPLASTY  02/03/2012    Procedure: TOTAL SHOULDER ARTHROPLASTY;  Surgeon: [name], MD;  Location: [facility];  Service: Orthopedics;  Laterality: Left;    REVIEW OF SYSTEMS:  Constitutional: positive for fatigue Eyes: negative Ears, nose, mouth, throat, and face: negative Respiratory: positive for dyspnea on exertion Cardiovascular: negative Gastrointestinal: negative Genitourinary:negative Integument/breast: negative Hematologic/lymphatic: negative Musculoskeletal:negative Neurological: negative Behavioral/Psych: negative Endocrine: negative Allergic/Immunologic: negative   PHYSICAL EXAMINATION: General appearance: alert, cooperative, fatigued and no distress Head: Normocephalic, without obvious abnormality, atraumatic Neck: no adenopathy, no JVD, supple, symmetrical, trachea midline and thyroid not enlarged, symmetric, no tenderness/mass/nodules Lymph nodes: Cervical, supraclavicular, and axillary nodes normal. Resp: clear to auscultation bilaterally Back: symmetric, no curvature. ROM normal. No CVA tenderness. Cardio: regular rate and rhythm, S1, S2 normal, no murmur, click, rub or gallop  GI: soft, non-tender; bowel sounds normal; no masses,  no organomegaly Extremities: extremities normal, atraumatic, no cyanosis or edema Neurologic: Alert and oriented X 3, normal strength and tone. Normal symmetric reflexes. Normal coordination and gait  ECOG PERFORMANCE STATUS: 1 - Symptomatic but completely ambulatory  Blood pressure (!) 169/68, pulse 75, temperature 97.5 F (36.4 C), temperature source Oral, resp. rate 18, weight 152 lb 3.2 oz (69 kg), SpO2 98 %.  LABORATORY DATA: Lab Results  Component Value Date   WBC 4.9 12/07/2016   HGB 15.3 12/07/2016   HCT 43.1 12/07/2016   MCV 93.3 12/07/2016   PLT 179 12/07/2016      Chemistry      Component Value Date/Time   NA 137 12/07/2016 1007   K 3.9 12/07/2016 1007   CL 101 09/14/2016 1319   CO2 27 12/07/2016 1007   BUN 18.4  12/07/2016 1007   CREATININE 0.8 12/07/2016 1007      Component Value Date/Time   CALCIUM 10.3 12/07/2016 1007   ALKPHOS 113 12/07/2016 1007   AST 22 12/07/2016 1007   ALT 14 12/07/2016 1007   BILITOT 0.85 12/07/2016 1007       RADIOGRAPHIC STUDIES: No results found.  ASSESSMENT AND PLAN:  This is a very pleasant 76 years old white male with stage IV non-small cell lung cancer, adenocarcinoma status post induction chemotherapy with carboplatin and Alimta followed by few cycles of maintenance Alimta discontinued secondary to disease progression. The patient is currently undergoing second line treatment with immunotherapy with Nivolumab status post 44 cycles. He is tolerating treatment well except for the persistent fatigue. I recommended for the patient to proceed with cycle #45 today as a scheduled. For the hypertension, I strongly encouraged the patient to take his blood pressure medication and monitor it closely at home. He was also advised to reconsult with his primary care physician if he continues to have elevated blood pressure. For the hypothyroidism, I will continue to monitor his thyroid function closely. He was advised to call immediately if he has any concerning symptoms in the interval. The patient voices understanding of current disease status and treatment options and is in agreement with the current care plan.  All questions were answered. The patient knows to call the clinic with any problems, questions or concerns. We can certainly see the patient much sooner if necessary.  Disclaimer: This note was dictated with voice recognition software. Similar sounding words can inadvertently be transcribed and may not be corrected upon review.

## 2016-12-07 NOTE — Patient Instructions (Signed)
[facility] Discharge Instructions for Patients Receiving Chemotherapy  Today you received the following chemotherapy agents Opdivo.  To help prevent nausea and vomiting after your treatment, we encourage you to take your nausea medication as directed.   If you develop nausea and vomiting that is not controlled by your nausea medication, call the clinic.   BELOW ARE SYMPTOMS THAT SHOULD BE REPORTED IMMEDIATELY:  *FEVER GREATER THAN 100.5 F  *CHILLS WITH OR WITHOUT FEVER  NAUSEA AND VOMITING THAT IS NOT CONTROLLED WITH YOUR NAUSEA MEDICATION  *UNUSUAL SHORTNESS OF BREATH  *UNUSUAL BRUISING OR BLEEDING  TENDERNESS IN MOUTH AND THROAT WITH OR WITHOUT PRESENCE OF ULCERS  *URINARY PROBLEMS  *BOWEL PROBLEMS  UNUSUAL RASH Items with * indicate a potential emergency and should be followed up as soon as possible.  Feel free to call the clinic you have any questions or concerns. The clinic phone number is [phone].  Please show the CHEMO ALERT CARD at check-in to the Emergency Department and triage nurse.    

## 2016-12-12 ENCOUNTER — Telehealth: Payer: Self-pay | Admitting: Internal Medicine

## 2016-12-12 NOTE — Telephone Encounter (Signed)
Spoke with the patients family and reminded them of the appointment on 12/21/16.  She already knew about the appointments but wanted to know about a CT scan and when would the next one be done.  I told her to ask the Dr when they came in to see him

## 2016-12-21 ENCOUNTER — Ambulatory Visit: Payer: Medicare Other | Admitting: Radiation Oncology

## 2016-12-21 ENCOUNTER — Other Ambulatory Visit (HOSPITAL_BASED_OUTPATIENT_CLINIC_OR_DEPARTMENT_OTHER): Payer: Medicare Other

## 2016-12-21 ENCOUNTER — Ambulatory Visit (HOSPITAL_BASED_OUTPATIENT_CLINIC_OR_DEPARTMENT_OTHER): Payer: Medicare Other | Admitting: Internal Medicine

## 2016-12-21 ENCOUNTER — Encounter: Payer: Self-pay | Admitting: Internal Medicine

## 2016-12-21 ENCOUNTER — Ambulatory Visit (HOSPITAL_BASED_OUTPATIENT_CLINIC_OR_DEPARTMENT_OTHER): Payer: Medicare Other

## 2016-12-21 ENCOUNTER — Ambulatory Visit
Admission: RE | Admit: 2016-12-21 | Discharge: 2016-12-21 | Disposition: A | Discharge status: Home / Self Care | Payer: Medicare Other | Source: Ambulatory Visit | Attending: Radiation Oncology | Admitting: Radiation Oncology

## 2016-12-21 VITALS — BP 150/64 | HR 87 | Temp 98.0°F | Resp 18 | Ht 71.0 in | Wt 155.0 lb

## 2016-12-21 DIAGNOSIS — I10 Essential (primary) hypertension: Secondary | ICD-10-CM | POA: Diagnosis not present

## 2016-12-21 DIAGNOSIS — C7972 Secondary malignant neoplasm of left adrenal gland: Secondary | ICD-10-CM

## 2016-12-21 DIAGNOSIS — C3431 Malignant neoplasm of lower lobe, right bronchus or lung: Secondary | ICD-10-CM | POA: Insufficient documentation

## 2016-12-21 DIAGNOSIS — C3412 Malignant neoplasm of upper lobe, left bronchus or lung: Secondary | ICD-10-CM

## 2016-12-21 DIAGNOSIS — Z7901 Long term (current) use of anticoagulants: Secondary | ICD-10-CM | POA: Diagnosis not present

## 2016-12-21 DIAGNOSIS — Z923 Personal history of irradiation: Secondary | ICD-10-CM | POA: Diagnosis not present

## 2016-12-21 DIAGNOSIS — Z5112 Encounter for antineoplastic immunotherapy: Secondary | ICD-10-CM

## 2016-12-21 DIAGNOSIS — C7802 Secondary malignant neoplasm of left lung: Secondary | ICD-10-CM | POA: Insufficient documentation

## 2016-12-21 DIAGNOSIS — Z79899 Other long term (current) drug therapy: Secondary | ICD-10-CM

## 2016-12-21 LAB — COMPREHENSIVE METABOLIC PANEL
ALT: 15 U/L (ref 0–55)
AST: 20 U/L (ref 5–34)
Albumin: 3.8 g/dL (ref 3.5–5.0)
Alkaline Phosphatase: 97 U/L (ref 40–150)
Anion Gap: 10 mEq/L (ref 3–11)
BILIRUBIN TOTAL: 0.87 mg/dL (ref 0.20–1.20)
BUN: 18.9 mg/dL (ref 7.0–26.0)
CO2: 22 meq/L (ref 22–29)
CREATININE: 0.8 mg/dL (ref 0.7–1.3)
Calcium: 9.4 mg/dL (ref 8.4–10.4)
Chloride: 101 mEq/L (ref 98–109)
EGFR: 86 mL/min/{1.73_m2} — ABNORMAL LOW (ref >90)
GLUCOSE: 144 mg/dL — AB (ref 70–140)
Potassium: 3.9 mEq/L (ref 3.5–5.1)
SODIUM: 134 meq/L — AB (ref 136–145)
TOTAL PROTEIN: 6.3 g/dL — AB (ref 6.4–8.3)

## 2016-12-21 LAB — CBC WITH DIFFERENTIAL/PLATELET
BASO%: 0.5 % (ref 0.0–2.0)
Basophils Absolute: 0 10*3/uL (ref 0.0–0.1)
EOS%: 4.7 % (ref 0.0–7.0)
Eosinophils Absolute: 0.2 10*3/uL (ref 0.0–0.5)
HCT: 40.9 % (ref 38.4–49.9)
HGB: 14.5 g/dL (ref 13.0–17.1)
LYMPH%: 22.2 % (ref 14.0–49.0)
MCH: 33 pg (ref 27.2–33.4)
MCHC: 35.5 g/dL (ref 32.0–36.0)
MCV: 93.2 fL (ref 79.3–98.0)
MONO#: 0.4 10*3/uL (ref 0.1–0.9)
MONO%: 11.2 % (ref 0.0–14.0)
NEUT%: 61.4 % (ref 39.0–75.0)
NEUTROS ABS: 2.2 10*3/uL (ref 1.5–6.5)
PLATELETS: 181 10*3/uL (ref 140–400)
RBC: 4.39 10*6/uL (ref 4.20–5.82)
RDW: 14.4 % (ref 11.0–14.6)
WBC: 3.7 10*3/uL — AB (ref 4.0–10.3)
lymph#: 0.8 10*3/uL — ABNORMAL LOW (ref 0.9–3.3)

## 2016-12-21 LAB — TSH: TSH: 1.942 m(IU)/L (ref 0.320–4.118)

## 2016-12-21 MED ORDER — SODIUM CHLORIDE 0.9 % IV SOLN
240.0000 mg | Freq: Once | INTRAVENOUS | Status: AC
  Administered 2016-12-21: 240 mg via INTRAVENOUS
  Filled 2016-12-21: qty 10

## 2016-12-21 MED ORDER — SODIUM CHLORIDE 0.9 % IV SOLN
Freq: Once | INTRAVENOUS | Status: AC
  Administered 2016-12-21: 12:00:00 via INTRAVENOUS

## 2016-12-21 NOTE — Patient Instructions (Signed)
[facility] Discharge Instructions for Patients Receiving Chemotherapy  Today you received the following chemotherapy agents:  Nivolumab.  To help prevent nausea and vomiting after your treatment, we encourage you to take your nausea medication as directed.   If you develop nausea and vomiting that is not controlled by your nausea medication, call the clinic.   BELOW ARE SYMPTOMS THAT SHOULD BE REPORTED IMMEDIATELY:  *FEVER GREATER THAN 100.5 F  *CHILLS WITH OR WITHOUT FEVER  NAUSEA AND VOMITING THAT IS NOT CONTROLLED WITH YOUR NAUSEA MEDICATION  *UNUSUAL SHORTNESS OF BREATH  *UNUSUAL BRUISING OR BLEEDING  TENDERNESS IN MOUTH AND THROAT WITH OR WITHOUT PRESENCE OF ULCERS  *URINARY PROBLEMS  *BOWEL PROBLEMS  UNUSUAL RASH Items with * indicate a potential emergency and should be followed up as soon as possible.  Feel free to call the clinic you have any questions or concerns. The clinic phone number is [phone].  Please show the CHEMO ALERT CARD at check-in to the Emergency Department and triage nurse.   

## 2016-12-21 NOTE — Progress Notes (Signed)
Radiation Oncology         [phone] ________________________________  Name: Anthony Curry MRN: [id number]  Date: 12/21/2016  DOB: Jul 09, 1941  Follow-Up Visit Note  CC: Anthony Austria, MD  Anthony Bears, MD    ICD-9-CM ICD-10-CM   1. Malignant neoplasm of upper lobe of left lung ([place]) 162.3 C34.12     Diagnosis:   Stage IV (T1a, N0, M1b) non-small cell lung cancer of the right lower lobe, now with oligometastasis in the left upper lobe     Interval Since Last Radiation:  1 months,   Radiation treatment dates:   11/14/16, 11/18/16, 11/22/16  Site/dose:   Left upper lung: 54 Gy in 3 fractions  Beams/energy:   SBRT/SRT-3D // 6FFF Photon  CURRENT THERAPY: Immunotherapy with Nivolumab 240 MG IV every 2 weeks status post 44 cycles. First cycle was given 01/26/2016.  Narrative:  The patient returns today for routine follow-up.  He is seen upstairs in medical oncology infusion area. the patient is receiving additional immunotherapy with Nivolumab.   He is doing well at this time. He denies any cough or breathing problems. He denies any pain within the chest area or hemoptysis. He denies any new bony pain headaches dizziness or blurred vision.  Patient enjoyed his recent visit to the beach                       ALLERGIES:  has No Known Allergies.  Meds: Current Outpatient Prescriptions  Medication Sig Dispense Refill  . ALPRAZolam (XANAX) 0.25 MG tablet Take 0.25 mg by mouth 3 (three) times daily as needed for anxiety or sleep. And also at night for sleep.    [name] apixaban (ELIQUIS) 5 MG TABS tablet Take 5 mg by mouth 2 (two) times daily.    . Ascorbic Acid (VITAMIN C PO) Take 1,000 mg by mouth daily.     . B Complex-C (SUPER B COMPLEX PO) Take 1 each by mouth daily.     . calcium carbonate (OS-CAL) 600 MG TABS Take 600 mg by mouth daily with breakfast.    . demeclocycline (DECLOMYCIN) 150 MG tablet Take 150 mg by mouth 2 (two) times daily.    [name] diltiazem (CARDIZEM CD)  180 MG 24 hr capsule Take 1 capsule (180 mg total) by mouth daily at 12 noon. 90 capsule 3  . furosemide (LASIX) 40 MG tablet Take 0.5 tablets (20 mg total) by mouth every other day.    [name] HYDROcodone-acetaminophen (NORCO/VICODIN) 5-325 MG tablet Take 1-2 tablets by mouth every 4 (four) hours as needed for moderate pain. 30 tablet 0  . KLOR-CON M20 20 MEQ tablet Take 20 mEq by mouth every morning.     . Lactobacillus (ACIDOPHILUS) CAPS capsule Take 1 capsule by mouth daily.    [name] MAGNESIUM PO Take 400 mg by mouth daily.     . Multiple Vitamin (MULTIVITAMIN WITH MINERALS) TABS Take 1 tablet by mouth daily.    . Nivolumab (OPDIVO IV) Inject as directed every 2 weeks    . Nutritional Supplements (ENSURE COMPLETE PO) Take 1 Can by mouth 2 (two) times daily.    . Omega-3 Fatty Acids (FISH OIL PO) Take 1 tablet by mouth daily.    . pantoprazole (PROTONIX) 40 MG tablet Take 40 mg by mouth every morning.  8  . tamsulosin (FLOMAX) 0.4 MG CAPS capsule Take 0.4 mg by mouth daily at 12 noon.     . thiamine (VITAMIN B-1) 100 MG tablet Take  1 tablet (100 mg total) by mouth daily. 30 tablet 6  . vitamin E 400 UNIT capsule Take 400 Units by mouth daily.     No current facility-administered medications for this encounter.    Facility-Administered Medications Ordered in Other Encounters  Medication Dose Route Frequency Provider Last Rate Last Dose  . 0.9 %  sodium chloride infusion   Intravenous Once Anthony Bears, MD        Physical Findings: The patient is in no acute distress. Patient is alert and oriented. No palpable supraclavicular or axillary adenopathy. The lungs are clear to auscultation. The heart has a regular rhythm and rate.  Lab Findings: Lab Results  Component Value Date   WBC 3.7 (L) 12/21/2016   HGB 14.5 12/21/2016   HCT 40.9 12/21/2016   MCV 93.2 12/21/2016   PLT 181 12/21/2016    Radiographic Findings: No results found.  Impression:  The patient tolerated his SBRT  well.  Plan:  Routine follow-up in 3 months. Patient will continue on immunotherapy as above  ____________________________________ Anthony Pray, MD

## 2016-12-21 NOTE — Progress Notes (Signed)
[facility] [phone]   [phone] [place] [id number]  DIAGNOSIS: Stage IV (T1a, N0, M1b) non-small cell lung cancer, adenocarcinoma of the right lower lobe diagnosed in October 2014  Molecular profile: Negative for RET, ALK, BRAF, MET, EGFR.  Positive for ERBB2 A775T, [id number], KRAS G13E, MAP2K1 D67N (see full report).   PRIOR THERAPY: 1) Systemic chemotherapy with carboplatin for AUC of 5 and Alimta 500 mg/M2 every 3 weeks, status post 6 cycles with stable disease. First dose on 10/23/2013. 2) Maintenance systemic chemotherapy with single agent Alimta 500 mg/M2 every 3 weeks. Status post 8 cycles.   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 Sondra Come completed on 12/16/2014. 4) stereotactic radiotherapy to the enlarging right upper lobe lung nodule under the care of Dr. Sondra Come completed on 11/22/2016  CURRENT THERAPY: Immunotherapy with Nivolumab 240 MG IV every 2 weeks status post 45 cycles. First cycle was given 01/26/2016.  INTERVAL HISTORY: Anthony Curry 76 y.o. male came to clinic today for follow-up visit accompanied by his wife. The patient is feeling fine today was no specific complaints except for the baseline fatigue and occasional dizzy spells. He denied having any chest pain, shortness of breath, cough or hemoptysis. He has no significant weight loss or night sweats. He has no nausea or vomiting. He denied having any skin rash or diarrhea. He is tolerating his treatment with immunotherapy fairly well. He is here today for evaluation before starting cycle #46.  MEDICAL HISTORY: Past Medical History:  Diagnosis Date  . 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 Adrenal mass ([place])   . Alcoholism in remission ([place])   . Anemia of chronic disease   . 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 Anxiety   . BPH  (benign prostatic hyperplasia)   . Cancer ([place])    small cell/lung  . Complication of anesthesia 09/08/2015    JITTERY AFTER GALLBLADDER SURGERY  . GERD (gastroesophageal reflux disease)   . Hypertension   . Hypertension 12/07/2016  . Neoplastic malignant related fatigue    IV tx. every 2 weeks last9-14-16 (Dr. Earlie Server), radiation last tx. 6 months  . 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 Persistent atrial fibrillation ([place])   . Pulmonary nodules   . Radiation 12/02/14-12/16/14   Left adrenal gland 30 Gy in 10 fractions    ALLERGIES:  has No Known Allergies.  MEDICATIONS:  Current Outpatient Prescriptions  Medication Sig Dispense Refill  . ALPRAZolam (XANAX) 0.25 MG tablet Take 0.25 mg by mouth 3 (three) times daily as needed for anxiety or sleep. And also at night for sleep.    [name] apixaban (ELIQUIS) 5 MG TABS tablet Take 5 mg by mouth 2 (two) times daily.    . Ascorbic Acid (VITAMIN C PO) Take 1,000 mg by mouth daily.     . B Complex-C (SUPER B COMPLEX PO) Take 1 each by mouth daily.     . calcium carbonate (OS-CAL) 600 MG TABS Take 600 mg by mouth daily with breakfast.    . demeclocycline (DECLOMYCIN) 150 MG tablet Take 150 mg by mouth 2 (two) times daily.    [name] diltiazem (CARDIZEM CD) 180 MG 24 hr capsule Take 1 capsule (180 mg total) by mouth daily at 12 noon. 90 capsule 3  . furosemide (LASIX) 40 MG tablet Take 0.5 tablets (20 mg total) by mouth  every other day.    [name] HYDROcodone-acetaminophen (NORCO/VICODIN) 5-325 MG tablet Take 1-2 tablets by mouth every 4 (four) hours as needed for moderate pain. 30 tablet 0  . KLOR-CON M20 20 MEQ tablet Take 20 mEq by mouth every morning.     . Lactobacillus (ACIDOPHILUS) CAPS capsule Take 1 capsule by mouth daily.    [name] MAGNESIUM PO Take 400 mg by mouth daily.     . Multiple Vitamin (MULTIVITAMIN WITH MINERALS) TABS Take 1 tablet by mouth daily.    . Nivolumab (OPDIVO IV) Inject as directed every 2 weeks    . Nutritional Supplements  (ENSURE COMPLETE PO) Take 1 Can by mouth 2 (two) times daily.    . Omega-3 Fatty Acids (FISH OIL PO) Take 1 tablet by mouth daily.    . pantoprazole (PROTONIX) 40 MG tablet Take 40 mg by mouth every morning.  8  . tamsulosin (FLOMAX) 0.4 MG CAPS capsule Take 0.4 mg by mouth daily at 12 noon.     . thiamine (VITAMIN B-1) 100 MG tablet Take 1 tablet (100 mg total) by mouth daily. 30 tablet 6  . vitamin E 400 UNIT capsule Take 400 Units by mouth daily.     No current facility-administered medications for this visit.    Facility-Administered Medications Ordered in Other Visits  Medication Dose Route Frequency Provider Last Rate Last Dose  . 0.9 %  sodium chloride infusion   Intravenous Once [name], MD        SURGICAL HISTORY:  Past Surgical History:  Procedure Laterality Date  . ANKLE ARTHRODESIS  1995   rt fx-hardware in  . CARDIOVERSION N/A 09/29/2014   Procedure: CARDIOVERSION;  Surgeon: Josue Hector, MD;  Location: [facility] ENDOSCOPY;  Service: Cardiovascular;  Laterality: N/A;  . CHOLECYSTECTOMY  09/08/2015    laproscopic   . CHOLECYSTECTOMY N/A 09/08/2015   Procedure: LAPAROSCOPIC CHOLECYSTECTOMY WITH ATTEMPTED INTRAOPERATIVE CHOLANGIOGRAM ;  Surgeon: Fanny Skates, MD;  Location: [facility];  Service: General;  Laterality: N/A;  . COLONOSCOPY    . ERCP N/A 09/02/2015   Procedure: ENDOSCOPIC RETROGRADE CHOLANGIOPANCREATOGRAPHY (ERCP);  Surgeon: Arta Silence, MD;  Location: [facility] ENDOSCOPY;  Service: Endoscopy;  Laterality: N/A;  . ERCP N/A 09/07/2015   Procedure: ENDOSCOPIC RETROGRADE CHOLANGIOPANCREATOGRAPHY (ERCP);  Surgeon: Clarene Essex, MD;  Location: [facility] ENDOSCOPY;  Service: Endoscopy;  Laterality: N/A;  . EXCISION / CURETTAGE BONE CYST FINGER  2005   rt thumb  . HEMORRHOID SURGERY    . Percutaneous Cholecytostomy tube     IVR insertion 06-13-15(Dr. Vernard Gambles)- 08-28-15 remains in place to drainage bag  . TONSILLECTOMY    . TOTAL SHOULDER ARTHROPLASTY  02/03/2012   Procedure: TOTAL  SHOULDER ARTHROPLASTY;  Surgeon: [name], MD;  Location: [facility];  Service: Orthopedics;  Laterality: Left;    REVIEW OF SYSTEMS:  A comprehensive review of systems was negative except for: Constitutional: positive for fatigue   PHYSICAL EXAMINATION: General appearance: alert, cooperative, fatigued and no distress Head: Normocephalic, without obvious abnormality, atraumatic Neck: no adenopathy, no JVD, supple, symmetrical, trachea midline and thyroid not enlarged, symmetric, no tenderness/mass/nodules Lymph nodes: Cervical, supraclavicular, and axillary nodes normal. Resp: clear to auscultation bilaterally Back: symmetric, no curvature. ROM normal. No CVA tenderness. Cardio: regular rate and rhythm, S1, S2 normal, no murmur, click, rub or gallop GI: soft, non-tender; bowel sounds normal; no masses,  no organomegaly Extremities: extremities normal, atraumatic, no cyanosis or edema  ECOG PERFORMANCE STATUS: 1 - Symptomatic but completely ambulatory  Blood pressure (!) 150/64, pulse 87, temperature 98 F (36.7 C), temperature source Oral, resp. rate 18, height _0  (1.803 m), weight 155 lb (70.3 kg), SpO2 100 %.  LABORATORY DATA: Lab Results  Component Value Date   WBC 3.7 (L) 12/21/2016   HGB 14.5 12/21/2016   HCT 40.9 12/21/2016   MCV 93.2 12/21/2016   PLT 181 12/21/2016      Chemistry      Component Value Date/Time   NA 137 12/07/2016 1007   K 3.9 12/07/2016 1007   CL 101 09/14/2016 1319   CO2 27 12/07/2016 1007   BUN 18.4 12/07/2016 1007   CREATININE 0.8 12/07/2016 1007      Component Value Date/Time   CALCIUM 10.3 12/07/2016 1007   ALKPHOS 113 12/07/2016 1007   AST 22 12/07/2016 1007   ALT 14 12/07/2016 1007   BILITOT 0.85 12/07/2016 1007       RADIOGRAPHIC STUDIES: No results found.  ASSESSMENT AND PLAN:  This is a very pleasant 77 years old white male with a stage IV non-small cell lung cancer, adenocarcinoma. The patient is status  post several chemotherapy regimens and he is currently on treatment with immunotherapy with Nivolumab status post 45 cycles. He has been tolerating his treatment well with no concerning adverse effects. I recommended for the patient to proceed with cycle #46 today as scheduled. I will see him back for follow-up visit in 2 weeks for evaluation after repeating CT scan of the chest, abdomen and pelvis for restaging of his disease. For hypertension, I advised the patient to monitor his blood pressure closely at home. He will continue with his current blood pressure medication. He was advised to call immediately if he has any concerning symptoms in the interval. The patient voices understanding of current disease status and treatment options and is in agreement with the current care plan.  All questions were answered. The patient knows to call the clinic with any problems, questions or concerns. We can certainly see the patient much sooner if necessary. I spent 10 minutes counseling the patient face to face. The total time spent in the appointment was 15 minutes.  Disclaimer: This note was dictated with voice recognition software. Similar sounding words can inadvertently be transcribed and may not be corrected upon review.

## 2016-12-22 ENCOUNTER — Telehealth: Payer: Self-pay | Admitting: *Deleted

## 2016-12-22 NOTE — Telephone Encounter (Signed)
CALLED PATIENT TO INFORM OF 3 MONTH FU ON 03-23-17 @ 10:15 AM WITH DR. KINARD, SPOKE WITH PATIENT'S WIFE AND SHE IS AWARE OF THIS APPT.

## 2017-01-02 ENCOUNTER — Ambulatory Visit (HOSPITAL_COMMUNITY)
Admission: RE | Admit: 2017-01-02 | Discharge: 2017-01-02 | Disposition: A | Discharge status: Home / Self Care | Payer: Medicare Other | Source: Ambulatory Visit | Attending: Internal Medicine | Admitting: Internal Medicine

## 2017-01-02 ENCOUNTER — Encounter (HOSPITAL_COMMUNITY): Payer: Self-pay

## 2017-01-02 DIAGNOSIS — Z5112 Encounter for antineoplastic immunotherapy: Secondary | ICD-10-CM | POA: Insufficient documentation

## 2017-01-02 DIAGNOSIS — J432 Centrilobular emphysema: Secondary | ICD-10-CM | POA: Diagnosis not present

## 2017-01-02 DIAGNOSIS — C3431 Malignant neoplasm of lower lobe, right bronchus or lung: Secondary | ICD-10-CM | POA: Insufficient documentation

## 2017-01-02 DIAGNOSIS — I2510 Atherosclerotic heart disease of native coronary artery without angina pectoris: Secondary | ICD-10-CM | POA: Diagnosis not present

## 2017-01-02 DIAGNOSIS — I700 Atherosclerosis of aorta: Secondary | ICD-10-CM | POA: Diagnosis not present

## 2017-01-02 MED ORDER — IOPAMIDOL (ISOVUE-300) INJECTION 61%
INTRAVENOUS | Status: AC
  Administered 2017-01-02: 100 mL
  Filled 2017-01-02: qty 100

## 2017-01-04 ENCOUNTER — Ambulatory Visit (HOSPITAL_BASED_OUTPATIENT_CLINIC_OR_DEPARTMENT_OTHER): Payer: Medicare Other

## 2017-01-04 ENCOUNTER — Other Ambulatory Visit (HOSPITAL_BASED_OUTPATIENT_CLINIC_OR_DEPARTMENT_OTHER): Payer: Medicare Other

## 2017-01-04 ENCOUNTER — Encounter: Payer: Self-pay | Admitting: Internal Medicine

## 2017-01-04 ENCOUNTER — Ambulatory Visit (HOSPITAL_BASED_OUTPATIENT_CLINIC_OR_DEPARTMENT_OTHER): Payer: Medicare Other | Admitting: Internal Medicine

## 2017-01-04 ENCOUNTER — Other Ambulatory Visit: Payer: Self-pay | Admitting: Medical Oncology

## 2017-01-04 VITALS — BP 140/75 | HR 66 | Temp 97.7°F | Resp 18 | Ht 71.0 in | Wt 156.2 lb

## 2017-01-04 DIAGNOSIS — F419 Anxiety disorder, unspecified: Secondary | ICD-10-CM | POA: Diagnosis not present

## 2017-01-04 DIAGNOSIS — I10 Essential (primary) hypertension: Secondary | ICD-10-CM | POA: Diagnosis not present

## 2017-01-04 DIAGNOSIS — C3431 Malignant neoplasm of lower lobe, right bronchus or lung: Secondary | ICD-10-CM

## 2017-01-04 DIAGNOSIS — I4891 Unspecified atrial fibrillation: Secondary | ICD-10-CM | POA: Diagnosis not present

## 2017-01-04 DIAGNOSIS — Z5112 Encounter for antineoplastic immunotherapy: Secondary | ICD-10-CM | POA: Diagnosis not present

## 2017-01-04 LAB — CBC WITH DIFFERENTIAL/PLATELET
BASO%: 0.9 % (ref 0.0–2.0)
BASOS ABS: 0 10*3/uL (ref 0.0–0.1)
EOS%: 7.5 % — AB (ref 0.0–7.0)
Eosinophils Absolute: 0.3 10*3/uL (ref 0.0–0.5)
HEMATOCRIT: 40.9 % (ref 38.4–49.9)
HEMOGLOBIN: 14.2 g/dL (ref 13.0–17.1)
LYMPH#: 1.1 10*3/uL (ref 0.9–3.3)
LYMPH%: 28.1 % (ref 14.0–49.0)
MCH: 33.5 pg — ABNORMAL HIGH (ref 27.2–33.4)
MCHC: 34.7 g/dL (ref 32.0–36.0)
MCV: 96.6 fL (ref 79.3–98.0)
MONO#: 0.6 10*3/uL (ref 0.1–0.9)
MONO%: 15.3 % — ABNORMAL HIGH (ref 0.0–14.0)
NEUT#: 1.9 10*3/uL (ref 1.5–6.5)
NEUT%: 48.2 % (ref 39.0–75.0)
Platelets: 201 10*3/uL (ref 140–400)
RBC: 4.23 10*6/uL (ref 4.20–5.82)
RDW: 14.6 % (ref 11.0–14.6)
WBC: 3.9 10*3/uL — ABNORMAL LOW (ref 4.0–10.3)

## 2017-01-04 LAB — COMPREHENSIVE METABOLIC PANEL
ALBUMIN: 3.8 g/dL (ref 3.5–5.0)
ALK PHOS: 95 U/L (ref 40–150)
ALT: 16 U/L (ref 0–55)
AST: 23 U/L (ref 5–34)
Anion Gap: 7 mEq/L (ref 3–11)
BILIRUBIN TOTAL: 0.74 mg/dL (ref 0.20–1.20)
BUN: 12.7 mg/dL (ref 7.0–26.0)
CALCIUM: 9.4 mg/dL (ref 8.4–10.4)
CO2: 25 mEq/L (ref 22–29)
CREATININE: 0.8 mg/dL (ref 0.7–1.3)
Chloride: 100 mEq/L (ref 98–109)
EGFR: 88 mL/min/{1.73_m2} — ABNORMAL LOW (ref >90)
Glucose: 91 mg/dl (ref 70–140)
Potassium: 4.6 mEq/L (ref 3.5–5.1)
Sodium: 133 mEq/L — ABNORMAL LOW (ref 136–145)
TOTAL PROTEIN: 6.1 g/dL — AB (ref 6.4–8.3)

## 2017-01-04 MED ORDER — SODIUM CHLORIDE 0.9 % IV SOLN
Freq: Once | INTRAVENOUS | Status: AC
  Administered 2017-01-04: 14:00:00 via INTRAVENOUS

## 2017-01-04 MED ORDER — ALPRAZOLAM 0.25 MG PO TABS: 0.2500 mg | ORAL_TABLET | Freq: Three times a day (TID) | ORAL | 0 refills | Status: DC | PRN

## 2017-01-04 MED ORDER — NIVOLUMAB CHEMO INJECTION 100 MG/10ML
240.0000 mg | Freq: Once | INTRAVENOUS | Status: AC
  Administered 2017-01-04: 240 mg via INTRAVENOUS
  Filled 2017-01-04: qty 20

## 2017-01-04 NOTE — Addendum Note (Signed)
Encounter addended by: Jacqulyn Liner, RN on: 01/04/2017  9:12 AM<BR>    Actions taken: Charge Capture section accepted

## 2017-01-04 NOTE — Progress Notes (Signed)
[place] [phone]   Fax:([phone]) [place], [place] [address]  DIAGNOSIS: Stage IV (T1a, N0, M1b) non-small cell lung cancer, adenocarcinoma of the right lower lobe diagnosed in October 2014  Molecular profile: Negative forRET, ALK, BRAF, MET, EGFR.  Positive for ERBB2 A775T, WNU2V253G, KRAS G13E, MAP2K1 D67N (see full report).   PRIOR THERAPY:  1) Systemic chemotherapy with carboplatin for AUC of 5 and Alimta 500 mg/M2 every 3 weeks, status post 6 cycles with stable disease. First dose on 10/23/2013. 2) Maintenance systemic chemotherapy with single agent Alimta 500 mg/M2 every 3 weeks. Status post 8 cycles.  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 Sondra Come completed on 12/16/2014. 4) stereotactic radiotherapy to the enlarging right upper lobe lung nodule under the care of Dr. Sondra Come completed on 11/22/2016  CURRENT THERAPY: Immunotherapy with Nivolumab 240 mg IV every 2 weeks, status post 46 cycles. First cycle was given on 01/28/2015.  INTERVAL HISTORY: Anthony Curry 76 y.o. male returns to the clinic today for follow-up visit accompanied by his wife and daughter Anthony Curry. The patient is feeling fine today with no specific complaints except for the baseline fatigue and occasional dizzy spells. He has been tolerating his treatment with immunotherapy with Nivolumab fairly well status post 46 cycles. He has no fever or chills. He has no nausea or vomiting. The patient denied having any significant chest pain, shortness of breath, cough or hemoptysis. He denied having any significant weight loss or night sweats. He had repeat CT scan of the chest, abdomen and pelvis performed recently and he is here for evaluation and discussion of the scan results.  MEDICAL HISTORY: Past Medical History:  Diagnosis Date  . 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[place])     . Adrenal mass ([place])   . Alcoholism in remission ([place])   . Anemia of chronic disease   . 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 Anxiety   . BPH (benign prostatic hyperplasia)   . Cancer ([place])    small cell/lung  . Complication of anesthesia 09/08/2015    JITTERY AFTER GALLBLADDER SURGERY  . GERD (gastroesophageal reflux disease)   . History of radiation therapy 11/14/16, 11/18/16, 11/22/16   Left upper lung: 54 Gy in 3 fractions  . Hypertension   . Hypertension 12/07/2016  . Neoplastic malignant related fatigue    IV tx. every 2 weeks last9-14-16 (Dr. Earlie Server), radiation last tx. 6 months  . 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 Persistent atrial fibrillation ([place])   . Pulmonary nodules   . Radiation 12/02/14-12/16/14   Left adrenal gland 30 Gy in 10 fractions    ALLERGIES:  has No Known Allergies.  MEDICATIONS:  Current Outpatient Prescriptions  Medication Sig Dispense Refill  . ALPRAZolam (XANAX) 0.25 MG tablet Take 0.25 mg by mouth 3 (three) times daily as needed for anxiety or sleep. And also at night for sleep.    [name] apixaban (ELIQUIS) 5 MG TABS tablet Take 5 mg by mouth 2 (two) times daily.    . Ascorbic Acid (VITAMIN C PO) Take 1,000 mg by mouth daily.     . B Complex-C (SUPER B COMPLEX PO) Take 1 each by mouth daily.     . calcium carbonate (OS-CAL) 600 MG TABS Take 600 mg by mouth daily with breakfast.    . demeclocycline (DECLOMYCIN) 150 MG tablet Take 150 mg by  mouth 2 (two) times daily.    [name] diltiazem (CARDIZEM CD) 180 MG 24 hr capsule Take 1 capsule (180 mg total) by mouth daily at 12 noon. 90 capsule 3  . furosemide (LASIX) 40 MG tablet Take 0.5 tablets (20 mg total) by mouth every other day.    [name] HYDROcodone-acetaminophen (NORCO/VICODIN) 5-325 MG tablet Take 1-2 tablets by mouth every 4 (four) hours as needed for moderate pain. 30 tablet 0  . KLOR-CON M20 20 MEQ tablet Take 20 mEq by mouth every morning.     . Lactobacillus (ACIDOPHILUS) CAPS  capsule Take 1 capsule by mouth daily.    [name] MAGNESIUM PO Take 400 mg by mouth daily.     . Multiple Vitamin (MULTIVITAMIN WITH MINERALS) TABS Take 1 tablet by mouth daily.    . Nivolumab (OPDIVO IV) Inject as directed every 2 weeks    . Nutritional Supplements (ENSURE COMPLETE PO) Take 1 Can by mouth 2 (two) times daily.    . Omega-3 Fatty Acids (FISH OIL PO) Take 1 tablet by mouth daily.    . pantoprazole (PROTONIX) 40 MG tablet Take 40 mg by mouth every morning.  8  . tamsulosin (FLOMAX) 0.4 MG CAPS capsule Take 0.4 mg by mouth daily at 12 noon.     . thiamine (VITAMIN B-1) 100 MG tablet Take 1 tablet (100 mg total) by mouth daily. 30 tablet 6  . vitamin E 400 UNIT capsule Take 400 Units by mouth daily.     No current facility-administered medications for this visit.    Facility-Administered Medications Ordered in Other Visits  Medication Dose Route Frequency Provider Last Rate Last Dose  . 0.9 %  sodium chloride infusion   Intravenous Once [name], MD        SURGICAL HISTORY:  Past Surgical History:  Procedure Laterality Date  . ANKLE ARTHRODESIS  1995   rt fx-hardware in  . CARDIOVERSION N/A 09/29/2014   Procedure: CARDIOVERSION;  Surgeon: Josue Hector, MD;  Location[facility] ENDOSCOPY;  Service: Cardiovascular;  Laterality: N/A;  . CHOLECYSTECTOMY  09/08/2015    laproscopic   . CHOLECYSTECTOMY N/A 09/08/2015   Procedure: LAPAROSCOPIC CHOLECYSTECTOMY WITH ATTEMPTED INTRAOPERATIVE CHOLANGIOGRAM ;  Surgeon: Fanny Skates, MD;  Location: [facility];  Service: General;  Laterality: N/A;  . COLONOSCOPY    . ERCP N/A 09/02/2015   Procedure: ENDOSCOPIC RETROGRADE CHOLANGIOPANCREATOGRAPHY (ERCP);  Surgeon: Arta Silence, MD;  Location: [facility] ENDOSCOPY;  Service: Endoscopy;  Laterality: N/A;  . ERCP N/A 09/07/2015   Procedure: ENDOSCOPIC RETROGRADE CHOLANGIOPANCREATOGRAPHY (ERCP);  Surgeon: Clarene Essex, MD;  Location: [facility] ENDOSCOPY;  Service: Endoscopy;  Laterality: N/A;  . EXCISION /  CURETTAGE BONE CYST FINGER  2005   rt thumb  . HEMORRHOID SURGERY    . Percutaneous Cholecytostomy tube     IVR insertion 06-13-15(Dr. Vernard Gambles)- 08-28-15 remains in place to drainage bag  . TONSILLECTOMY    . TOTAL SHOULDER ARTHROPLASTY  02/03/2012   Procedure: TOTAL SHOULDER ARTHROPLASTY;  Surgeon: [name], MD;  Location: [facility];  Service: Orthopedics;  Laterality: Left;    REVIEW OF SYSTEMS:  Constitutional: positive for fatigue Eyes: negative Ears, nose, mouth, throat, and face: negative Respiratory: negative Cardiovascular: negative Gastrointestinal: negative Genitourinary:negative Integument/breast: negative Hematologic/lymphatic: negative Musculoskeletal:negative Neurological: positive for dizziness Behavioral/Psych: negative Endocrine: negative Allergic/Immunologic: negative   PHYSICAL EXAMINATION: General appearance: alert, cooperative, fatigued and no distress Head: Normocephalic, without obvious abnormality, atraumatic Neck: no adenopathy, no JVD, supple, symmetrical, trachea midline and thyroid not enlarged,  symmetric, no tenderness/mass/nodules Lymph nodes: Cervical, supraclavicular, and axillary nodes normal. Resp: clear to auscultation bilaterally Back: symmetric, no curvature. ROM normal. No CVA tenderness. Cardio: regular rate and rhythm, S1, S2 normal, no murmur, click, rub or gallop GI: soft, non-tender; bowel sounds normal; no masses,  no organomegaly Extremities: extremities normal, atraumatic, no cyanosis or edema Neurologic: Alert and oriented X 3, normal strength and tone. Normal symmetric reflexes. Normal coordination and gait  ECOG PERFORMANCE STATUS: 1 - Symptomatic but completely ambulatory  Blood pressure 140/75, pulse 66, temperature 97.7 F (36.5 C), temperature source Oral, resp. rate 18, height 5' 11" (1.803 m), weight 156 lb 3.2 oz (70.9 kg), SpO2 100 %.  LABORATORY DATA: Lab Results  Component Value Date   WBC 3.9 (L)  01/04/2017   HGB 14.2 01/04/2017   HCT 40.9 01/04/2017   MCV 96.6 01/04/2017   PLT 201 01/04/2017      Chemistry      Component Value Date/Time   NA 133 (L) 01/04/2017 1118   K 4.6 01/04/2017 1118   CL 101 09/14/2016 1319   CO2 25 01/04/2017 1118   BUN 12.7 01/04/2017 1118   CREATININE 0.8 01/04/2017 1118      Component Value Date/Time   CALCIUM 9.4 01/04/2017 1118   ALKPHOS 95 01/04/2017 1118   AST 23 01/04/2017 1118   ALT 16 01/04/2017 1118   BILITOT 0.74 01/04/2017 1118       RADIOGRAPHIC STUDIES: Ct Chest W Contrast  Result Date: 01/03/2017 CLINICAL DATA:  Right lower lobe lung cancer diagnosed in 2014. Restaging. Immunotherapy in progress. Ex-smoker. EXAM: CT CHEST, ABDOMEN, AND PELVIS WITH CONTRAST TECHNIQUE: Multidetector CT imaging of the chest, abdomen and pelvis was performed following the standard protocol during bolus administration of intravenous contrast. CONTRAST:  1 ISOVUE-300 IOPAMIDOL (ISOVUE-300) INJECTION 61% COMPARISON:  10/25/2016 chest CT.  10/21/2016 abdominopelvic CT. FINDINGS: CT CHEST FINDINGS Cardiovascular: left shoulder hemiarthroplasty, with resultant beam hardening artifact. Advanced aortic and branch vessel atherosclerosis. Tortuous thoracic aorta. Normal heart size with multivessel coronary artery atherosclerosis. Anterior pericardial fluid is similar. No central pulmonary embolism, on this non-dedicated study. Mediastinum/Nodes: No supraclavicular adenopathy. Similar right-sided gynecomastia. No mediastinal or hilar adenopathy. Lungs/Pleura: No pleural fluid. Advanced centrilobular emphysema. Multifocal ground-glass opacities and areas of architectural distortion are again identified. Index lesion in the lateral right upper lobe measures 2.8 x 2.2 cm on image 67/series 6. Felt to be increased from 2.5 x 1.4 cm previously. Right base scarring. Area superior segment right lower lobe interstitial thickening is most likely indicative of scarring. This does  however, have areas of nodularity which are new or more apparent today. Example image 87 and 90/series 6. Left apical spiculated nodule measures 1.6 x 1.2 cm on image 32/ series 6. Compare 1.8 x 1.4 cm on the prior. Extension superiorly and anteriorly persists. Left upper lobe ground-glass nodule measures 2.8 x 1.7 cm on image 65/series 6. Felt to be similar to 2.6 x 1.7 cm on the prior. Left base scarring. Posterior left upper lobe 8 mm spiculated nodule is similar, including image 48/series 6. Musculoskeletal: Marked right shoulder osteoarthritis. Left shoulder hemiarthroplasty. Remote bilateral rib trauma. CT ABDOMEN PELVIS FINDINGS Hepatobiliary: Similar appearance of the liver, with subcentimeter hyperenhancing foci within the high right and left hepatic lobes. small well-circumscribed low-density lesions which are likely cysts. Pneumobilia. Cholecystectomy. Pancreas: Normal, without mass or ductal dilatation. Spleen: Normal in size, without focal abnormality. Adrenals/Urinary Tract: Normal left adrenal gland. Mild right adrenal nodularity is similar over  prior exams and likely benign. Mild renal scarring bilaterally, without hydronephrosis. Bladder wall irregularity with small diverticula, similar. Stomach/Bowel: Normal stomach, without wall thickening. Scattered colonic diverticula. Normal small bowel. Vascular/Lymphatic: Advanced aortic and branch vessel atherosclerosis. 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 No abdominopelvic adenopathy. Reproductive: Normal prostate. Other: No significant free fluid. No evidence of omental or peritoneal disease. Musculoskeletal: Mild osteopenia. Left acetabular sclerotic lesions are similar and likely bone islands. Right iliac lucent lesion has been present back to 2015 and can be presumed benign. Advanced lumbosacral spondylosis. IMPRESSION: CT CHEST IMPRESSION 1. The left upper lobe solid nodule is slightly smaller. No evidence of thoracic  adenopathy. 2. Centrilobular emphysema with multiple areas of ground-glass opacity and architectural distortion. A lesion in the right upper lobe measures slightly larger. An area of interstitial thickening in the superior segment right lower lobe demonstrates new or increased nodularity. These both warrant followup attention. 3.  Coronary artery atherosclerosis. Aortic atherosclerosis. CT ABDOMEN AND PELVIS IMPRESSION 1. No acute process or evidence of metastatic disease in the abdomen or pelvis. 2. Bladder findings which suggest outlet obstruction, as before. 3. 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 Electronically Signed   By: Abigail Miyamoto M.D.   On: 01/03/2017 08:19   Ct Abdomen Pelvis W Contrast  Result Date: 01/03/2017 CLINICAL DATA:  Right lower lobe lung cancer diagnosed in 2014. Restaging. Immunotherapy in progress. Ex-smoker. EXAM: CT CHEST, ABDOMEN, AND PELVIS WITH CONTRAST TECHNIQUE: Multidetector CT imaging of the chest, abdomen and pelvis was performed following the standard protocol during bolus administration of intravenous contrast. CONTRAST:  1 ISOVUE-300 IOPAMIDOL (ISOVUE-300) INJECTION 61% COMPARISON:  10/25/2016 chest CT.  10/21/2016 abdominopelvic CT. FINDINGS: CT CHEST FINDINGS Cardiovascular: left shoulder hemiarthroplasty, with resultant beam hardening artifact. Advanced aortic and branch vessel atherosclerosis. Tortuous thoracic aorta. Normal heart size with multivessel coronary artery atherosclerosis. Anterior pericardial fluid is similar. No central pulmonary embolism, on this non-dedicated study. Mediastinum/Nodes: No supraclavicular adenopathy. Similar right-sided gynecomastia. No mediastinal or hilar adenopathy. Lungs/Pleura: No pleural fluid. Advanced centrilobular emphysema. Multifocal ground-glass opacities and areas of architectural distortion are again identified. Index lesion in the lateral right upper lobe measures  2.8 x 2.2 cm on image 67/series 6. Felt to be increased from 2.5 x 1.4 cm previously. Right base scarring. Area superior segment right lower lobe interstitial thickening is most likely indicative of scarring. This does however, have areas of nodularity which are new or more apparent today. Example image 87 and 90/series 6. Left apical spiculated nodule measures 1.6 x 1.2 cm on image 32/ series 6. Compare 1.8 x 1.4 cm on the prior. Extension superiorly and anteriorly persists. Left upper lobe ground-glass nodule measures 2.8 x 1.7 cm on image 65/series 6. Felt to be similar to 2.6 x 1.7 cm on the prior. Left base scarring. Posterior left upper lobe 8 mm spiculated nodule is similar, including image 48/series 6. Musculoskeletal: Marked right shoulder osteoarthritis. Left shoulder hemiarthroplasty. Remote bilateral rib trauma. CT ABDOMEN PELVIS FINDINGS Hepatobiliary: Similar appearance of the liver, with subcentimeter hyperenhancing foci within the high right and left hepatic lobes. small well-circumscribed low-density lesions which are likely cysts. Pneumobilia. Cholecystectomy. Pancreas: Normal, without mass or ductal dilatation. Spleen: Normal in size, without focal abnormality. Adrenals/Urinary Tract: Normal left adrenal gland. Mild right adrenal nodularity is similar over prior exams and likely benign. Mild renal scarring bilaterally, without hydronephrosis. Bladder wall irregularity with small diverticula, similar. Stomach/Bowel: Normal stomach, without wall thickening. Scattered  colonic diverticula. Normal small bowel. Vascular/Lymphatic: Advanced aortic and branch vessel atherosclerosis. 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 No abdominopelvic adenopathy. Reproductive: Normal prostate. Other: No significant free fluid. No evidence of omental or peritoneal disease. Musculoskeletal: Mild osteopenia. Left acetabular sclerotic lesions are similar and likely bone islands. Right iliac  lucent lesion has been present back to 2015 and can be presumed benign. Advanced lumbosacral spondylosis. IMPRESSION: CT CHEST IMPRESSION 1. The left upper lobe solid nodule is slightly smaller. No evidence of thoracic adenopathy. 2. Centrilobular emphysema with multiple areas of ground-glass opacity and architectural distortion. A lesion in the right upper lobe measures slightly larger. An area of interstitial thickening in the superior segment right lower lobe demonstrates new or increased nodularity. These both warrant followup attention. 3.  Coronary artery atherosclerosis. Aortic atherosclerosis. CT ABDOMEN AND PELVIS IMPRESSION 1. No acute process or evidence of metastatic disease in the abdomen or pelvis. 2. Bladder findings which suggest outlet obstruction, as before. 3. 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 Electronically Signed   By: Abigail Miyamoto M.D.   On: 01/03/2017 08:19    ASSESSMENT AND PLAN: This is a very pleasant 76 years old white male with metastatic non-small cell lung cancer, adenocarcinoma status post several chemotherapy regimens including induction systemic chemotherapy with carboplatin and Alimta followed by maintenance Alimta discontinued secondary to disease progression and he is currently on treatment with second line immunotherapy with Nivolumab status post 46 cycles. The patient has been tolerating his treatment with Nivolumab fairly well with no significant adverse effects. He had repeat CT scan of the chest, abdomen and pelvis performed recently. I personally and independently reviewed the scan images and discuss the results with the patient and his family today. His scan showed some improvement in the left upper lobe lung nodule and stable disease in the other lesions. I recommended for the patient to continue his current treatment with immunotherapy with Nivolumab 240 mg IV every 2 weeks and he will start cycle #47  today. I will see him back for follow-up visit in 2 weeks for evaluation before starting the next cycle of his treatment. For the history of atrial fibrillation, the patient will continue his current treatment with Eliquis. For hypertension, he is currently on diltiazem. For anxiety he will continue his treatment with Xanax and I given him refill of this medication today. He requested a 90 day supply. The patient was advised to call immediately if he has any concerning symptoms in the interval. The patient voices understanding of current disease status and treatment options and is in agreement with the current care plan.  All questions were answered. The patient knows to call the clinic with any problems, questions or concerns. We can certainly see the patient much sooner if necessary.  Disclaimer: This note was dictated with voice recognition software. Similar sounding words can inadvertently be transcribed and may not be corrected upon review.

## 2017-01-04 NOTE — Patient Instructions (Signed)
[facility] Discharge Instructions for Patients Receiving Chemotherapy  Today you received the following chemotherapy agents:  Nivolumab.  To help prevent nausea and vomiting after your treatment, we encourage you to take your nausea medication as directed.   If you develop nausea and vomiting that is not controlled by your nausea medication, call the clinic.   BELOW ARE SYMPTOMS THAT SHOULD BE REPORTED IMMEDIATELY:  *FEVER GREATER THAN 100.5 F  *CHILLS WITH OR WITHOUT FEVER  NAUSEA AND VOMITING THAT IS NOT CONTROLLED WITH YOUR NAUSEA MEDICATION  *UNUSUAL SHORTNESS OF BREATH  *UNUSUAL BRUISING OR BLEEDING  TENDERNESS IN MOUTH AND THROAT WITH OR WITHOUT PRESENCE OF ULCERS  *URINARY PROBLEMS  *BOWEL PROBLEMS  UNUSUAL RASH Items with * indicate a potential emergency and should be followed up as soon as possible.  Feel free to call the clinic you have any questions or concerns. The clinic phone number is [phone].  Please show the CHEMO ALERT CARD at check-in to the Emergency Department and triage nurse.   

## 2017-01-04 NOTE — Progress Notes (Signed)
Pt brought in and empty alprazolam bottle ( was [facility]90)  [facility]. Asking for refill.

## 2017-01-05 ENCOUNTER — Ambulatory Visit: Payer: Self-pay | Admitting: Radiation Oncology

## 2017-01-18 ENCOUNTER — Ambulatory Visit (HOSPITAL_BASED_OUTPATIENT_CLINIC_OR_DEPARTMENT_OTHER): Payer: Medicare Other | Admitting: Internal Medicine

## 2017-01-18 ENCOUNTER — Ambulatory Visit (HOSPITAL_BASED_OUTPATIENT_CLINIC_OR_DEPARTMENT_OTHER): Payer: Medicare Other

## 2017-01-18 ENCOUNTER — Telehealth: Payer: Self-pay | Admitting: Internal Medicine

## 2017-01-18 ENCOUNTER — Encounter: Payer: Self-pay | Admitting: Internal Medicine

## 2017-01-18 ENCOUNTER — Other Ambulatory Visit (HOSPITAL_BASED_OUTPATIENT_CLINIC_OR_DEPARTMENT_OTHER): Payer: Medicare Other

## 2017-01-18 VITALS — BP 138/71 | HR 66 | Temp 97.5°F | Resp 18 | Ht 71.0 in | Wt 157.3 lb

## 2017-01-18 DIAGNOSIS — H353221 Exudative age-related macular degeneration, left eye, with active choroidal neovascularization: Secondary | ICD-10-CM | POA: Diagnosis not present

## 2017-01-18 DIAGNOSIS — C3431 Malignant neoplasm of lower lobe, right bronchus or lung: Secondary | ICD-10-CM

## 2017-01-18 DIAGNOSIS — R42 Dizziness and giddiness: Secondary | ICD-10-CM | POA: Diagnosis not present

## 2017-01-18 DIAGNOSIS — Z5112 Encounter for antineoplastic immunotherapy: Secondary | ICD-10-CM | POA: Diagnosis not present

## 2017-01-18 LAB — CBC WITH DIFFERENTIAL/PLATELET
BASO%: 0.4 % (ref 0.0–2.0)
Basophils Absolute: 0 10*3/uL (ref 0.0–0.1)
EOS%: 5.6 % (ref 0.0–7.0)
Eosinophils Absolute: 0.3 10*3/uL (ref 0.0–0.5)
HCT: 42 % (ref 38.4–49.9)
HGB: 14.8 g/dL (ref 13.0–17.1)
LYMPH%: 22.2 % (ref 14.0–49.0)
MCH: 33.1 pg (ref 27.2–33.4)
MCHC: 35.2 g/dL (ref 32.0–36.0)
MCV: 94 fL (ref 79.3–98.0)
MONO#: 0.6 10*3/uL (ref 0.1–0.9)
MONO%: 12.7 % (ref 0.0–14.0)
NEUT#: 2.7 10*3/uL (ref 1.5–6.5)
NEUT%: 59.1 % (ref 39.0–75.0)
Platelets: 212 10*3/uL (ref 140–400)
RBC: 4.47 10*6/uL (ref 4.20–5.82)
RDW: 14.3 % (ref 11.0–14.6)
WBC: 4.5 10*3/uL (ref 4.0–10.3)
lymph#: 1 10*3/uL (ref 0.9–3.3)

## 2017-01-18 LAB — COMPREHENSIVE METABOLIC PANEL
ALT: 17 U/L (ref 0–55)
AST: 23 U/L (ref 5–34)
Albumin: 4 g/dL (ref 3.5–5.0)
Alkaline Phosphatase: 97 U/L (ref 40–150)
Anion Gap: 10 mEq/L (ref 3–11)
BUN: 18.1 mg/dL (ref 7.0–26.0)
CHLORIDE: 98 meq/L (ref 98–109)
CO2: 25 meq/L (ref 22–29)
CREATININE: 0.9 mg/dL (ref 0.7–1.3)
Calcium: 9.7 mg/dL (ref 8.4–10.4)
EGFR: 83 mL/min/{1.73_m2} — ABNORMAL LOW (ref >90)
Glucose: 101 mg/dl (ref 70–140)
Potassium: 4.2 mEq/L (ref 3.5–5.1)
SODIUM: 134 meq/L — AB (ref 136–145)
Total Bilirubin: 0.9 mg/dL (ref 0.20–1.20)
Total Protein: 6.5 g/dL (ref 6.4–8.3)

## 2017-01-18 MED ORDER — SODIUM CHLORIDE 0.9 % IV SOLN
240.0000 mg | Freq: Once | INTRAVENOUS | Status: AC
  Administered 2017-01-18: 240 mg via INTRAVENOUS
  Filled 2017-01-18: qty 20

## 2017-01-18 MED ORDER — SODIUM CHLORIDE 0.9 % IV SOLN
Freq: Once | INTRAVENOUS | Status: AC
  Administered 2017-01-18: 12:00:00 via INTRAVENOUS

## 2017-01-18 NOTE — Patient Instructions (Signed)
[facility] Discharge Instructions for Patients Receiving Chemotherapy  Today you received the following chemotherapy agents:  Nivolumab.  To help prevent nausea and vomiting after your treatment, we encourage you to take your nausea medication as directed.   If you develop nausea and vomiting that is not controlled by your nausea medication, call the clinic.   BELOW ARE SYMPTOMS THAT SHOULD BE REPORTED IMMEDIATELY:  *FEVER GREATER THAN 100.5 F  *CHILLS WITH OR WITHOUT FEVER  NAUSEA AND VOMITING THAT IS NOT CONTROLLED WITH YOUR NAUSEA MEDICATION  *UNUSUAL SHORTNESS OF BREATH  *UNUSUAL BRUISING OR BLEEDING  TENDERNESS IN MOUTH AND THROAT WITH OR WITHOUT PRESENCE OF ULCERS  *URINARY PROBLEMS  *BOWEL PROBLEMS  UNUSUAL RASH Items with * indicate a potential emergency and should be followed up as soon as possible.  Feel free to call the clinic you have any questions or concerns. The clinic phone number is [phone].  Please show the CHEMO ALERT CARD at check-in to the Emergency Department and triage nurse.   

## 2017-01-18 NOTE — Progress Notes (Signed)
[facility] [phone]   Fax:([phone]) [place], [place] [address]  DIAGNOSIS: Stage IV (T1a, N0, M1b) non-small cell lung cancer, adenocarcinoma of the right lower lobe diagnosed in October 2014  Molecular profile: Negative forRET, ALK, BRAF, MET, EGFR.  Positive for ERBB2 A775T, [id number], KRAS G13E, MAP2K1 D67N (see full report).   PRIOR THERAPY:  1) Systemic chemotherapy with carboplatin for AUC of 5 and Alimta 500 mg/M2 every 3 weeks, status post 6 cycles with stable disease. First dose on 10/23/2013. 2) Maintenance systemic chemotherapy with single agent Alimta 500 mg/M2 every 3 weeks. Status post 8 cycles.  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 Sondra Come completed on 12/16/2014. 4) stereotactic radiotherapy to the enlarging right upper lobe lung nodule under the care of Dr. Sondra Come completed on 11/22/2016  CURRENT THERAPY: Immunotherapy with Nivolumab 240 mg IV every 2 weeks, status post 47 cycles. First cycle was given on 01/28/2015.  INTERVAL HISTORY: Anthony Curry. 76 y.o. male came to the clinic today for follow-up visit accompanied by his wife. The patient is currently on treatment with immunotherapy with Nivolumab status post 47 cycles. He is tolerating his treatment well with no significant adverse effects. He denied having any chest pain, shortness of breath, cough or hemoptysis. He continues to have occasional dizzy spells. He denied having any fever or chills. He has no significant weight loss or night sweats. He is here today for evaluation before starting cycle #48.  MEDICAL HISTORY: Past Medical History:  Diagnosis Date  . 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[place])   . Adrenal mass ([place])   . Alcoholism in remission ([place])   . Anemia of chronic disease   . 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 Anxiety   .  BPH (benign prostatic hyperplasia)   . Cancer ([place])    small cell/lung  . Complication of anesthesia 09/08/2015    JITTERY AFTER GALLBLADDER SURGERY  . GERD (gastroesophageal reflux disease)   . History of radiation therapy 11/14/16, 11/18/16, 11/22/16   Left upper lung: 54 Gy in 3 fractions  . Hypertension   . Hypertension 12/07/2016  . Neoplastic malignant related fatigue    IV tx. every 2 weeks last9-14-16 (Dr. Earlie Server), radiation last tx. 6 months  . 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 Persistent atrial fibrillation ([place])   . Pulmonary nodules   . Radiation 12/02/14-12/16/14   Left adrenal gland 30 Gy in 10 fractions    ALLERGIES:  has No Known Allergies.  MEDICATIONS:  Current Outpatient Prescriptions  Medication Sig Dispense Refill  . ALPRAZolam (XANAX) 0.25 MG tablet Take 1 tablet (0.25 mg total) by mouth 3 (three) times daily as needed for anxiety or sleep. And also at night for sleep. 90 tablet 0  . apixaban (ELIQUIS) 5 MG TABS tablet Take 5 mg by mouth 2 (two) times daily.    . Ascorbic Acid (VITAMIN C PO) Take 1,000 mg by mouth daily.     . B Complex-C (SUPER B COMPLEX PO) Take 1 each by mouth daily.     . calcium carbonate (OS-CAL) 600 MG TABS Take 600 mg by mouth daily with breakfast.    . demeclocycline (DECLOMYCIN) 150 MG tablet Take 150 mg by mouth 2 (two) times daily.    [name] diltiazem (CARDIZEM CD) 180 MG 24 hr capsule Take 1 capsule (180 mg total) by  mouth daily at 12 noon. 90 capsule 3  . furosemide (LASIX) 40 MG tablet Take 0.5 tablets (20 mg total) by mouth every other day.    [name] HYDROcodone-acetaminophen (NORCO/VICODIN) 5-325 MG tablet Take 1-2 tablets by mouth every 4 (four) hours as needed for moderate pain. 30 tablet 0  . KLOR-CON M20 20 MEQ tablet Take 20 mEq by mouth every morning.     . Lactobacillus (ACIDOPHILUS) CAPS capsule Take 1 capsule by mouth daily.    [name] MAGNESIUM PO Take 400 mg by mouth daily.     . Multiple Vitamin (MULTIVITAMIN WITH  MINERALS) TABS Take 1 tablet by mouth daily.    . Nivolumab (OPDIVO IV) Inject as directed every 2 weeks    . Nutritional Supplements (ENSURE COMPLETE PO) Take 1 Can by mouth 2 (two) times daily.    . Omega-3 Fatty Acids (FISH OIL PO) Take 1 tablet by mouth daily.    . pantoprazole (PROTONIX) 40 MG tablet Take 40 mg by mouth every morning.  8  . tamsulosin (FLOMAX) 0.4 MG CAPS capsule Take 0.4 mg by mouth daily at 12 noon.     . thiamine (VITAMIN B-1) 100 MG tablet Take 1 tablet (100 mg total) by mouth daily. 30 tablet 6  . vitamin E 400 UNIT capsule Take 400 Units by mouth daily.     No current facility-administered medications for this visit.    Facility-Administered Medications Ordered in Other Visits  Medication Dose Route Frequency Provider Last Rate Last Dose  . 0.9 %  sodium chloride infusion   Intravenous Once [name], MD        SURGICAL HISTORY:  Past Surgical History:  Procedure Laterality Date  . ANKLE ARTHRODESIS  1995   rt fx-hardware in  . CARDIOVERSION N/A 09/29/2014   Procedure: CARDIOVERSION;  Surgeon: Josue Hector, MD;  Location: [facility] ENDOSCOPY;  Service: Cardiovascular;  Laterality: N/A;  . CHOLECYSTECTOMY  09/08/2015    laproscopic   . CHOLECYSTECTOMY N/A 09/08/2015   Procedure: LAPAROSCOPIC CHOLECYSTECTOMY WITH ATTEMPTED INTRAOPERATIVE CHOLANGIOGRAM ;  Surgeon: Fanny Skates, MD;  Location: [facility];  Service: General;  Laterality: N/A;  . COLONOSCOPY    . ERCP N/A 09/02/2015   Procedure: ENDOSCOPIC RETROGRADE CHOLANGIOPANCREATOGRAPHY (ERCP);  Surgeon: Arta Silence, MD;  Location: [facility] ENDOSCOPY;  Service: Endoscopy;  Laterality: N/A;  . ERCP N/A 09/07/2015   Procedure: ENDOSCOPIC RETROGRADE CHOLANGIOPANCREATOGRAPHY (ERCP);  Surgeon: Clarene Essex, MD;  Location: [facility] ENDOSCOPY;  Service: Endoscopy;  Laterality: N/A;  . EXCISION / CURETTAGE BONE CYST FINGER  2005   rt thumb  . HEMORRHOID SURGERY    . Percutaneous Cholecytostomy tube     IVR insertion 06-13-15(Dr.  Vernard Gambles)- 08-28-15 remains in place to drainage bag  . TONSILLECTOMY    . TOTAL SHOULDER ARTHROPLASTY  02/03/2012   Procedure: TOTAL SHOULDER ARTHROPLASTY;  Surgeon: [name], MD;  Location: [facility];  Service: Orthopedics;  Laterality: Left;    REVIEW OF SYSTEMS:  A comprehensive review of systems was negative except for: Constitutional: positive for fatigue Neurological: positive for dizziness   PHYSICAL EXAMINATION: General appearance: alert, cooperative, fatigued and no distress Head: Normocephalic, without obvious abnormality, atraumatic Neck: no adenopathy, no JVD, supple, symmetrical, trachea midline and thyroid not enlarged, symmetric, no tenderness/mass/nodules Lymph nodes: Cervical, supraclavicular, and axillary nodes normal. Resp: clear to auscultation bilaterally Back: symmetric, no curvature. ROM normal. No CVA tenderness. Cardio: regular rate and rhythm, S1, S2 normal, no murmur, click, rub or gallop GI: soft, non-tender;  bowel sounds normal; no masses,  no organomegaly Extremities: extremities normal, atraumatic, no cyanosis or edema  ECOG PERFORMANCE STATUS: 1 - Symptomatic but completely ambulatory  Blood pressure 138/71, pulse 66, temperature 97.5 F (36.4 C), temperature source Oral, resp. rate 18, height 5' 11"  (1.803 m), weight 157 lb 4.8 oz (71.4 kg), SpO2 100 %.  LABORATORY DATA: Lab Results  Component Value Date   WBC 4.5 01/18/2017   HGB 14.8 01/18/2017   HCT 42.0 01/18/2017   MCV 94.0 01/18/2017   PLT 212 01/18/2017      Chemistry      Component Value Date/Time   NA 134 (L) 01/18/2017 1012   K 4.2 01/18/2017 1012   CL 101 09/14/2016 1319   CO2 25 01/18/2017 1012   BUN 18.1 01/18/2017 1012   CREATININE 0.9 01/18/2017 1012      Component Value Date/Time   CALCIUM 9.7 01/18/2017 1012   ALKPHOS 97 01/18/2017 1012   AST 23 01/18/2017 1012   ALT 17 01/18/2017 1012   BILITOT 0.90 01/18/2017 1012       RADIOGRAPHIC  STUDIES: Ct Chest W Contrast  Result Date: 01/03/2017 CLINICAL DATA:  Right lower lobe lung cancer diagnosed in 2014. Restaging. Immunotherapy in progress. Ex-smoker. EXAM: CT CHEST, ABDOMEN, AND PELVIS WITH CONTRAST TECHNIQUE: Multidetector CT imaging of the chest, abdomen and pelvis was performed following the standard protocol during bolus administration of intravenous contrast. CONTRAST:  1 ISOVUE-300 IOPAMIDOL (ISOVUE-300) INJECTION 61% COMPARISON:  10/25/2016 chest CT.  10/21/2016 abdominopelvic CT. FINDINGS: CT CHEST FINDINGS Cardiovascular: left shoulder hemiarthroplasty, with resultant beam hardening artifact. Advanced aortic and branch vessel atherosclerosis. Tortuous thoracic aorta. Normal heart size with multivessel coronary artery atherosclerosis. Anterior pericardial fluid is similar. No central pulmonary embolism, on this non-dedicated study. Mediastinum/Nodes: No supraclavicular adenopathy. Similar right-sided gynecomastia. No mediastinal or hilar adenopathy. Lungs/Pleura: No pleural fluid. Advanced centrilobular emphysema. Multifocal ground-glass opacities and areas of architectural distortion are again identified. Index lesion in the lateral right upper lobe measures 2.8 x 2.2 cm on image 67/series 6. Felt to be increased from 2.5 x 1.4 cm previously. Right base scarring. Area superior segment right lower lobe interstitial thickening is most likely indicative of scarring. This does however, have areas of nodularity which are new or more apparent today. Example image 87 and 90/series 6. Left apical spiculated nodule measures 1.6 x 1.2 cm on image 32/ series 6. Compare 1.8 x 1.4 cm on the prior. Extension superiorly and anteriorly persists. Left upper lobe ground-glass nodule measures 2.8 x 1.7 cm on image 65/series 6. Felt to be similar to 2.6 x 1.7 cm on the prior. Left base scarring. Posterior left upper lobe 8 mm spiculated nodule is similar, including image 48/series 6. Musculoskeletal:  Marked right shoulder osteoarthritis. Left shoulder hemiarthroplasty. Remote bilateral rib trauma. CT ABDOMEN PELVIS FINDINGS Hepatobiliary: Similar appearance of the liver, with subcentimeter hyperenhancing foci within the high right and left hepatic lobes. small well-circumscribed low-density lesions which are likely cysts. Pneumobilia. Cholecystectomy. Pancreas: Normal, without mass or ductal dilatation. Spleen: Normal in size, without focal abnormality. Adrenals/Urinary Tract: Normal left adrenal gland. Mild right adrenal nodularity is similar over prior exams and likely benign. Mild renal scarring bilaterally, without hydronephrosis. Bladder wall irregularity with small diverticula, similar. Stomach/Bowel: Normal stomach, without wall thickening. Scattered colonic diverticula. Normal small bowel. Vascular/Lymphatic: Advanced aortic and branch vessel atherosclerosis. 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 No abdominopelvic adenopathy. Reproductive: Normal prostate. Other: No significant free fluid. No evidence  of omental or peritoneal disease. Musculoskeletal: Mild osteopenia. Left acetabular sclerotic lesions are similar and likely bone islands. Right iliac lucent lesion has been present back to 2015 and can be presumed benign. Advanced lumbosacral spondylosis. IMPRESSION: CT CHEST IMPRESSION 1. The left upper lobe solid nodule is slightly smaller. No evidence of thoracic adenopathy. 2. Centrilobular emphysema with multiple areas of ground-glass opacity and architectural distortion. A lesion in the right upper lobe measures slightly larger. An area of interstitial thickening in the superior segment right lower lobe demonstrates new or increased nodularity. These both warrant followup attention. 3.  Coronary artery atherosclerosis. Aortic atherosclerosis. CT ABDOMEN AND PELVIS IMPRESSION 1. No acute process or evidence of metastatic disease in the abdomen or pelvis. 2. Bladder  findings which suggest outlet obstruction, as before. 3. 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 Electronically Signed   By: Abigail Miyamoto M.D.   On: 01/03/2017 08:19   Ct Abdomen Pelvis W Contrast  Result Date: 01/03/2017 CLINICAL DATA:  Right lower lobe lung cancer diagnosed in 2014. Restaging. Immunotherapy in progress. Ex-smoker. EXAM: CT CHEST, ABDOMEN, AND PELVIS WITH CONTRAST TECHNIQUE: Multidetector CT imaging of the chest, abdomen and pelvis was performed following the standard protocol during bolus administration of intravenous contrast. CONTRAST:  1 ISOVUE-300 IOPAMIDOL (ISOVUE-300) INJECTION 61% COMPARISON:  10/25/2016 chest CT.  10/21/2016 abdominopelvic CT. FINDINGS: CT CHEST FINDINGS Cardiovascular: left shoulder hemiarthroplasty, with resultant beam hardening artifact. Advanced aortic and branch vessel atherosclerosis. Tortuous thoracic aorta. Normal heart size with multivessel coronary artery atherosclerosis. Anterior pericardial fluid is similar. No central pulmonary embolism, on this non-dedicated study. Mediastinum/Nodes: No supraclavicular adenopathy. Similar right-sided gynecomastia. No mediastinal or hilar adenopathy. Lungs/Pleura: No pleural fluid. Advanced centrilobular emphysema. Multifocal ground-glass opacities and areas of architectural distortion are again identified. Index lesion in the lateral right upper lobe measures 2.8 x 2.2 cm on image 67/series 6. Felt to be increased from 2.5 x 1.4 cm previously. Right base scarring. Area superior segment right lower lobe interstitial thickening is most likely indicative of scarring. This does however, have areas of nodularity which are new or more apparent today. Example image 87 and 90/series 6. Left apical spiculated nodule measures 1.6 x 1.2 cm on image 32/ series 6. Compare 1.8 x 1.4 cm on the prior. Extension superiorly and anteriorly persists. Left upper lobe ground-glass nodule  measures 2.8 x 1.7 cm on image 65/series 6. Felt to be similar to 2.6 x 1.7 cm on the prior. Left base scarring. Posterior left upper lobe 8 mm spiculated nodule is similar, including image 48/series 6. Musculoskeletal: Marked right shoulder osteoarthritis. Left shoulder hemiarthroplasty. Remote bilateral rib trauma. CT ABDOMEN PELVIS FINDINGS Hepatobiliary: Similar appearance of the liver, with subcentimeter hyperenhancing foci within the high right and left hepatic lobes. small well-circumscribed low-density lesions which are likely cysts. Pneumobilia. Cholecystectomy. Pancreas: Normal, without mass or ductal dilatation. Spleen: Normal in size, without focal abnormality. Adrenals/Urinary Tract: Normal left adrenal gland. Mild right adrenal nodularity is similar over prior exams and likely benign. Mild renal scarring bilaterally, without hydronephrosis. Bladder wall irregularity with small diverticula, similar. Stomach/Bowel: Normal stomach, without wall thickening. Scattered colonic diverticula. Normal small bowel. Vascular/Lymphatic: Advanced aortic and branch vessel atherosclerosis. 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 No abdominopelvic adenopathy. Reproductive: Normal prostate. Other: No significant free fluid. No evidence of omental or peritoneal disease. Musculoskeletal: Mild osteopenia. Left acetabular sclerotic lesions are similar and likely bone islands. Right iliac lucent lesion has been present  back to 2015 and can be presumed benign. Advanced lumbosacral spondylosis. IMPRESSION: CT CHEST IMPRESSION 1. The left upper lobe solid nodule is slightly smaller. No evidence of thoracic adenopathy. 2. Centrilobular emphysema with multiple areas of ground-glass opacity and architectural distortion. A lesion in the right upper lobe measures slightly larger. An area of interstitial thickening in the superior segment right lower lobe demonstrates new or increased nodularity. These  both warrant followup attention. 3.  Coronary artery atherosclerosis. Aortic atherosclerosis. CT ABDOMEN AND PELVIS IMPRESSION 1. No acute process or evidence of metastatic disease in the abdomen or pelvis. 2. Bladder findings which suggest outlet obstruction, as before. 3. 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 Electronically Signed   By: Abigail Miyamoto M.D.   On: 01/03/2017 08:19    ASSESSMENT AND PLAN:  This is a very pleasant 76 years old white male with metastatic non-small cell lung cancer, adenocarcinoma. The patient is currently undergoing treatment with immunotherapy with Nivolumab status post 47 cycles. He has been tolerating his treatment well with no significant adverse effects. I recommended for the patient to proceed with cycle #48 today as a scheduled. He would come back for follow-up visit in 2 weeks for evaluation before the next cycle of his treatment. The patient was advised to call immediately if he has any concerning symptoms in the interval. The patient voices understanding of current disease status and treatment options and is in agreement with the current care plan.  All questions were answered. The patient knows to call the clinic with any problems, questions or concerns. We can certainly see the patient much sooner if necessary. I spent 10 minutes counseling the patient face to face. The total time spent in the appointment was 15 minutes.  Disclaimer: This note was dictated with voice recognition software. Similar sounding words can inadvertently be transcribed and may not be corrected upon review.

## 2017-01-18 NOTE — Telephone Encounter (Signed)
Message sent to chemo scheduler to be added per 01/18/17 los. Patient bypassed scheduling/check out area. Will pick up schedule at next visit. 01/18/17

## 2017-01-19 ENCOUNTER — Telehealth: Payer: Self-pay | Admitting: *Deleted

## 2017-01-19 NOTE — Telephone Encounter (Signed)
Per 2/14 LOS and staff message I have scheduled appts. Notified the scheduler

## 2017-02-01 ENCOUNTER — Ambulatory Visit (HOSPITAL_BASED_OUTPATIENT_CLINIC_OR_DEPARTMENT_OTHER): Payer: Medicare Other

## 2017-02-01 ENCOUNTER — Ambulatory Visit (HOSPITAL_BASED_OUTPATIENT_CLINIC_OR_DEPARTMENT_OTHER): Payer: Medicare Other | Admitting: Internal Medicine

## 2017-02-01 ENCOUNTER — Other Ambulatory Visit (HOSPITAL_BASED_OUTPATIENT_CLINIC_OR_DEPARTMENT_OTHER): Payer: Medicare Other

## 2017-02-01 ENCOUNTER — Encounter: Payer: Self-pay | Admitting: Internal Medicine

## 2017-02-01 DIAGNOSIS — C3431 Malignant neoplasm of lower lobe, right bronchus or lung: Secondary | ICD-10-CM | POA: Diagnosis not present

## 2017-02-01 DIAGNOSIS — R12 Heartburn: Secondary | ICD-10-CM

## 2017-02-01 DIAGNOSIS — C3490 Malignant neoplasm of unspecified part of unspecified bronchus or lung: Secondary | ICD-10-CM

## 2017-02-01 DIAGNOSIS — R1013 Epigastric pain: Secondary | ICD-10-CM

## 2017-02-01 DIAGNOSIS — G893 Neoplasm related pain (acute) (chronic): Secondary | ICD-10-CM

## 2017-02-01 DIAGNOSIS — Z5112 Encounter for antineoplastic immunotherapy: Secondary | ICD-10-CM

## 2017-02-01 LAB — CBC WITH DIFFERENTIAL/PLATELET
BASO%: 0.2 % (ref 0.0–2.0)
BASOS ABS: 0 10*3/uL (ref 0.0–0.1)
EOS ABS: 0.2 10*3/uL (ref 0.0–0.5)
EOS%: 3.2 % (ref 0.0–7.0)
HEMATOCRIT: 42.3 % (ref 38.4–49.9)
HEMOGLOBIN: 15.2 g/dL (ref 13.0–17.1)
LYMPH#: 0.8 10*3/uL — AB (ref 0.9–3.3)
LYMPH%: 14.7 % (ref 14.0–49.0)
MCH: 33.4 pg (ref 27.2–33.4)
MCHC: 35.9 g/dL (ref 32.0–36.0)
MCV: 93 fL (ref 79.3–98.0)
MONO#: 0.8 10*3/uL (ref 0.1–0.9)
MONO%: 13.5 % (ref 0.0–14.0)
NEUT#: 3.9 10*3/uL (ref 1.5–6.5)
NEUT%: 68.4 % (ref 39.0–75.0)
Platelets: 202 10*3/uL (ref 140–400)
RBC: 4.55 10*6/uL (ref 4.20–5.82)
RDW: 13.9 % (ref 11.0–14.6)
WBC: 5.7 10*3/uL (ref 4.0–10.3)

## 2017-02-01 LAB — COMPREHENSIVE METABOLIC PANEL
ALBUMIN: 4.1 g/dL (ref 3.5–5.0)
ALK PHOS: 101 U/L (ref 40–150)
ALT: 15 U/L (ref 0–55)
ANION GAP: 11 meq/L (ref 3–11)
AST: 22 U/L (ref 5–34)
BUN: 17.7 mg/dL (ref 7.0–26.0)
CALCIUM: 9.8 mg/dL (ref 8.4–10.4)
CO2: 24 mEq/L (ref 22–29)
Chloride: 96 mEq/L — ABNORMAL LOW (ref 98–109)
Creatinine: 0.9 mg/dL (ref 0.7–1.3)
EGFR: 83 mL/min/{1.73_m2} — AB (ref >90)
Glucose: 124 mg/dl (ref 70–140)
POTASSIUM: 3.9 meq/L (ref 3.5–5.1)
Sodium: 131 mEq/L — ABNORMAL LOW (ref 136–145)
Total Bilirubin: 0.93 mg/dL (ref 0.20–1.20)
Total Protein: 6.7 g/dL (ref 6.4–8.3)

## 2017-02-01 MED ORDER — HYDROCODONE-ACETAMINOPHEN 5-325 MG PO TABS: 1.0000 | ORAL_TABLET | ORAL | 0 refills | Status: DC | PRN

## 2017-02-01 MED ORDER — SODIUM CHLORIDE 0.9 % IV SOLN
Freq: Once | INTRAVENOUS | Status: AC
  Administered 2017-02-01: 13:00:00 via INTRAVENOUS

## 2017-02-01 MED ORDER — SODIUM CHLORIDE 0.9 % IV SOLN
240.0000 mg | Freq: Once | INTRAVENOUS | Status: AC
  Administered 2017-02-01: 240 mg via INTRAVENOUS
  Filled 2017-02-01: qty 20

## 2017-02-01 NOTE — Patient Instructions (Signed)
[facility] Discharge Instructions for Patients Receiving Chemotherapy  Today you received the following chemotherapy agents:  Nivolumab.  To help prevent nausea and vomiting after your treatment, we encourage you to take your nausea medication as directed.   If you develop nausea and vomiting that is not controlled by your nausea medication, call the clinic.   BELOW ARE SYMPTOMS THAT SHOULD BE REPORTED IMMEDIATELY:  *FEVER GREATER THAN 100.5 F  *CHILLS WITH OR WITHOUT FEVER  NAUSEA AND VOMITING THAT IS NOT CONTROLLED WITH YOUR NAUSEA MEDICATION  *UNUSUAL SHORTNESS OF BREATH  *UNUSUAL BRUISING OR BLEEDING  TENDERNESS IN MOUTH AND THROAT WITH OR WITHOUT PRESENCE OF ULCERS  *URINARY PROBLEMS  *BOWEL PROBLEMS  UNUSUAL RASH Items with * indicate a potential emergency and should be followed up as soon as possible.  Feel free to call the clinic you have any questions or concerns. The clinic phone number is [phone].  Please show the CHEMO ALERT CARD at check-in to the Emergency Department and triage nurse.   

## 2017-02-01 NOTE — Progress Notes (Signed)
Anthony Curry [phone]   Fax:([phone]) [place], [place] [address]  DIAGNOSIS: Stage IV (T1a, N0, M1b) non-small cell lung cancer, adenocarcinoma of the right lower lobe diagnosed in October 2014  Molecular profile: Negative forRET, ALK, BRAF, MET, EGFR.  Positive for ERBB2 A775T, [id number], KRAS G13E, MAP2K1 D67N (see full report).   PRIOR THERAPY:  1) Systemic chemotherapy with carboplatin for AUC of 5 and Alimta 500 mg/M2 every 3 weeks, status post 6 cycles with stable disease. First dose on 10/23/2013. 2) Maintenance systemic chemotherapy with single agent Alimta 500 mg/M2 every 3 weeks. Status post 8 cycles.  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 Sondra Come completed on 12/16/2014. 4) stereotactic radiotherapy to the enlarging right upper lobe lung nodule under the care of Dr. Sondra Come completed on 11/22/2016  CURRENT THERAPY: Immunotherapy with Nivolumab 240 mg IV every 2 weeks, status post 48 cycles. First cycle was given on 01/28/2015.  INTERVAL HISTORY: Anthony Curry. 76 y.o. male came to the clinic today for follow-up visit accompanied by his daughter. The patient is doing fine today with no specific complaints. He is tolerating his current treatment with immunotherapy fairly well. He denied having any chest pain but has occasional heartburn. He denied having any shortness breath, cough or hemoptysis. He has no fever or chills. He denied having any significant weight loss or night sweats. The patient is here today for evaluation before starting cycle #49.  MEDICAL HISTORY: Past Medical History:  Diagnosis Date  . 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[place])   . Adrenal mass ([place])   . Alcoholism in remission ([place])   . Anemia of chronic disease   . 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 Anxiety   . BPH (benign prostatic  hyperplasia)   . Cancer ([place])    small cell/lung  . Complication of anesthesia 09/08/2015    JITTERY AFTER GALLBLADDER SURGERY  . GERD (gastroesophageal reflux disease)   . History of radiation therapy 11/14/16, 11/18/16, 11/22/16   Left upper lung: 54 Gy in 3 fractions  . Hypertension   . Hypertension 12/07/2016  . Neoplastic malignant related fatigue    IV tx. every 2 weeks last9-14-16 (Dr. Earlie Server), radiation last tx. 6 months  . 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 Persistent atrial fibrillation ([place])   . Pulmonary nodules   . Radiation 12/02/14-12/16/14   Left adrenal gland 30 Gy in 10 fractions    ALLERGIES:  has No Known Allergies.  MEDICATIONS:  Current Outpatient Prescriptions  Medication Sig Dispense Refill  . ALPRAZolam (XANAX) 0.25 MG tablet Take 1 tablet (0.25 mg total) by mouth 3 (three) times daily as needed for anxiety or sleep. And also at night for sleep. 90 tablet 0  . apixaban (ELIQUIS) 5 MG TABS tablet Take 5 mg by mouth 2 (two) times daily.    . Ascorbic Acid (VITAMIN C PO) Take 1,000 mg by mouth daily.     . B Complex-C (SUPER B COMPLEX PO) Take 1 each by mouth daily.     . calcium carbonate (OS-CAL) 600 MG TABS Take 600 mg by mouth daily with breakfast.    . demeclocycline (DECLOMYCIN) 150 MG tablet Take 150 mg by mouth 2 (two) times daily.    [name] diltiazem (CARDIZEM CD) 180 MG 24 hr capsule Take 1 capsule (180 mg total) by mouth daily at 12  noon. 90 capsule 3  . furosemide (LASIX) 40 MG tablet Take 0.5 tablets (20 mg total) by mouth every other day.    [name] HYDROcodone-acetaminophen (NORCO/VICODIN) 5-325 MG tablet Take 1-2 tablets by mouth every 4 (four) hours as needed for moderate pain. 30 tablet 0  . KLOR-CON M20 20 MEQ tablet Take 20 mEq by mouth every morning.     . Lactobacillus (ACIDOPHILUS) CAPS capsule Take 1 capsule by mouth daily.    [name] MAGNESIUM PO Take 400 mg by mouth daily.     . Nivolumab (OPDIVO IV) Inject as directed every 2 weeks    .  Nutritional Supplements (ENSURE COMPLETE PO) Take 1 Can by mouth 2 (two) times daily.    . Omega-3 Fatty Acids (FISH OIL PO) Take 1 tablet by mouth daily.    . tamsulosin (FLOMAX) 0.4 MG CAPS capsule Take 0.4 mg by mouth daily at 12 noon.     . thiamine (VITAMIN B-1) 100 MG tablet Take 1 tablet (100 mg total) by mouth daily. 30 tablet 6  . vitamin E 400 UNIT capsule Take 400 Units by mouth daily.    . Multiple Vitamin (MULTIVITAMIN WITH MINERALS) TABS Take 1 tablet by mouth daily.    . pantoprazole (PROTONIX) 40 MG tablet Take 40 mg by mouth every morning.  8   No current facility-administered medications for this visit.    Facility-Administered Medications Ordered in Other Visits  Medication Dose Route Frequency Provider Last Rate Last Dose  . 0.9 %  sodium chloride infusion   Intravenous Once [name], MD        SURGICAL HISTORY:  Past Surgical History:  Procedure Laterality Date  . ANKLE ARTHRODESIS  1995   rt fx-hardware in  . CARDIOVERSION N/A 09/29/2014   Procedure: CARDIOVERSION;  Surgeon: Josue Hector, MD;  Location: [facility] ENDOSCOPY;  Service: Cardiovascular;  Laterality: N/A;  . CHOLECYSTECTOMY  09/08/2015    laproscopic   . CHOLECYSTECTOMY N/A 09/08/2015   Procedure: LAPAROSCOPIC CHOLECYSTECTOMY WITH ATTEMPTED INTRAOPERATIVE CHOLANGIOGRAM ;  Surgeon: Fanny Skates, MD;  Location: [place];  Service: General;  Laterality: N/A;  . COLONOSCOPY    . ERCP N/A 09/02/2015   Procedure: ENDOSCOPIC RETROGRADE CHOLANGIOPANCREATOGRAPHY (ERCP);  Surgeon: Arta Silence, MD;  Location: [facility];  Service: Endoscopy;  Laterality: N/A;  . ERCP N/A 09/07/2015   Procedure: ENDOSCOPIC RETROGRADE CHOLANGIOPANCREATOGRAPHY (ERCP);  Surgeon: Clarene Essex, MD;  Location: [facility] ENDOSCOPY;  Service: Endoscopy;  Laterality: N/A;  . EXCISION / CURETTAGE BONE CYST FINGER  2005   rt thumb  . HEMORRHOID SURGERY    . Percutaneous Cholecytostomy tube     IVR insertion 06-13-15(Dr. Vernard Gambles)- 08-28-15 remains  in place to drainage bag  . TONSILLECTOMY    . TOTAL SHOULDER ARTHROPLASTY  02/03/2012   Procedure: TOTAL SHOULDER ARTHROPLASTY;  Surgeon: [name], MD;  Location: [place];  Service: Orthopedics;  Laterality: Left;    REVIEW OF SYSTEMS:  A comprehensive review of systems was negative except for: Gastrointestinal: positive for dyspepsia   PHYSICAL EXAMINATION: General appearance: alert, cooperative and no distress Head: Normocephalic, without obvious abnormality, atraumatic Neck: no adenopathy, no JVD, supple, symmetrical, trachea midline and thyroid not enlarged, symmetric, no tenderness/mass/nodules Lymph nodes: Cervical, supraclavicular, and axillary nodes normal. Resp: clear to auscultation bilaterally Back: symmetric, no curvature. ROM normal. No CVA tenderness. Cardio: regular rate and rhythm, S1, S2 normal, no murmur, click, rub or gallop GI: soft, non-tender; bowel sounds normal; no masses,  no organomegaly Extremities:  extremities normal, atraumatic, no cyanosis or edema  ECOG PERFORMANCE STATUS: 1 - Symptomatic but completely ambulatory  Blood pressure (!) 156/82, pulse 81, temperature 98 F (36.7 C), temperature source Oral, resp. rate 18, height 5' 11"  (1.803 m), weight 156 lb 3.2 oz (70.9 kg), SpO2 99 %.  LABORATORY DATA: Lab Results  Component Value Date   WBC 5.7 02/01/2017   HGB 15.2 02/01/2017   HCT 42.3 02/01/2017   MCV 93.0 02/01/2017   PLT 202 02/01/2017      Chemistry      Component Value Date/Time   NA 134 (L) 01/18/2017 1012   K 4.2 01/18/2017 1012   CL 101 09/14/2016 1319   CO2 25 01/18/2017 1012   BUN 18.1 01/18/2017 1012   CREATININE 0.9 01/18/2017 1012      Component Value Date/Time   CALCIUM 9.7 01/18/2017 1012   ALKPHOS 97 01/18/2017 1012   AST 23 01/18/2017 1012   ALT 17 01/18/2017 1012   BILITOT 0.90 01/18/2017 1012       RADIOGRAPHIC STUDIES: Ct Chest W Contrast  Result Date: 01/03/2017 CLINICAL DATA:  Right  lower lobe lung cancer diagnosed in 2014. Restaging. Immunotherapy in progress. Ex-smoker. EXAM: CT CHEST, ABDOMEN, AND PELVIS WITH CONTRAST TECHNIQUE: Multidetector CT imaging of the chest, abdomen and pelvis was performed following the standard protocol during bolus administration of intravenous contrast. CONTRAST:  1 ISOVUE-300 IOPAMIDOL (ISOVUE-300) INJECTION 61% COMPARISON:  10/25/2016 chest CT.  10/21/2016 abdominopelvic CT. FINDINGS: CT CHEST FINDINGS Cardiovascular: left shoulder hemiarthroplasty, with resultant beam hardening artifact. Advanced aortic and branch vessel atherosclerosis. Tortuous thoracic aorta. Normal heart size with multivessel coronary artery atherosclerosis. Anterior pericardial fluid is similar. No central pulmonary embolism, on this non-dedicated study. Mediastinum/Nodes: No supraclavicular adenopathy. Similar right-sided gynecomastia. No mediastinal or hilar adenopathy. Lungs/Pleura: No pleural fluid. Advanced centrilobular emphysema. Multifocal ground-glass opacities and areas of architectural distortion are again identified. Index lesion in the lateral right upper lobe measures 2.8 x 2.2 cm on image 67/series 6. Felt to be increased from 2.5 x 1.4 cm previously. Right base scarring. Area superior segment right lower lobe interstitial thickening is most likely indicative of scarring. This does however, have areas of nodularity which are new or more apparent today. Example image 87 and 90/series 6. Left apical spiculated nodule measures 1.6 x 1.2 cm on image 32/ series 6. Compare 1.8 x 1.4 cm on the prior. Extension superiorly and anteriorly persists. Left upper lobe ground-glass nodule measures 2.8 x 1.7 cm on image 65/series 6. Felt to be similar to 2.6 x 1.7 cm on the prior. Left base scarring. Posterior left upper lobe 8 mm spiculated nodule is similar, including image 48/series 6. Musculoskeletal: Marked right shoulder osteoarthritis. Left shoulder hemiarthroplasty. Remote  bilateral rib trauma. CT ABDOMEN PELVIS FINDINGS Hepatobiliary: Similar appearance of the liver, with subcentimeter hyperenhancing foci within the high right and left hepatic lobes. small well-circumscribed low-density lesions which are likely cysts. Pneumobilia. Cholecystectomy. Pancreas: Normal, without mass or ductal dilatation. Spleen: Normal in size, without focal abnormality. Adrenals/Urinary Tract: Normal left adrenal gland. Mild right adrenal nodularity is similar over prior exams and likely benign. Mild renal scarring bilaterally, without hydronephrosis. Bladder wall irregularity with small diverticula, similar. Stomach/Bowel: Normal stomach, without wall thickening. Scattered colonic diverticula. Normal small bowel. Vascular/Lymphatic: Advanced aortic and branch vessel atherosclerosis. 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 No abdominopelvic adenopathy. Reproductive: Normal prostate. Other: No significant free fluid. No evidence of omental or peritoneal disease. Musculoskeletal: Mild osteopenia.  Left acetabular sclerotic lesions are similar and likely bone islands. Right iliac lucent lesion has been present back to 2015 and can be presumed benign. Advanced lumbosacral spondylosis. IMPRESSION: CT CHEST IMPRESSION 1. The left upper lobe solid nodule is slightly smaller. No evidence of thoracic adenopathy. 2. Centrilobular emphysema with multiple areas of ground-glass opacity and architectural distortion. A lesion in the right upper lobe measures slightly larger. An area of interstitial thickening in the superior segment right lower lobe demonstrates new or increased nodularity. These both warrant followup attention. 3.  Coronary artery atherosclerosis. Aortic atherosclerosis. CT ABDOMEN AND PELVIS IMPRESSION 1. No acute process or evidence of metastatic disease in the abdomen or pelvis. 2. Bladder findings which suggest outlet obstruction, as before. 3. Advanced atherosclerosis  with chronic marked superior mesenteric artery narrowing at the origin. Electronically Signed   By: Abigail Miyamoto M.D.   On: 01/03/2017 08:19   Ct Abdomen Pelvis W Contrast  Result Date: 01/03/2017 CLINICAL DATA:  Right lower lobe lung cancer diagnosed in 2014. Restaging. Immunotherapy in progress. Ex-smoker. EXAM: CT CHEST, ABDOMEN, AND PELVIS WITH CONTRAST TECHNIQUE: Multidetector CT imaging of the chest, abdomen and pelvis was performed following the standard protocol during bolus administration of intravenous contrast. CONTRAST:  1 ISOVUE-300 IOPAMIDOL (ISOVUE-300) INJECTION 61% COMPARISON:  10/25/2016 chest CT.  10/21/2016 abdominopelvic CT. FINDINGS: CT CHEST FINDINGS Cardiovascular: left shoulder hemiarthroplasty, with resultant beam hardening artifact. Advanced aortic and branch vessel atherosclerosis. Tortuous thoracic aorta. Normal heart size with multivessel coronary artery atherosclerosis. Anterior pericardial fluid is similar. No central pulmonary embolism, on this non-dedicated study. Mediastinum/Nodes: No supraclavicular adenopathy. Similar right-sided gynecomastia. No mediastinal or hilar adenopathy. Lungs/Pleura: No pleural fluid. Advanced centrilobular emphysema. Multifocal ground-glass opacities and areas of architectural distortion are again identified. Index lesion in the lateral right upper lobe measures 2.8 x 2.2 cm on image 67/series 6. Felt to be increased from 2.5 x 1.4 cm previously. Right base scarring. Area superior segment right lower lobe interstitial thickening is most likely indicative of scarring. This does however, have areas of nodularity which are new or more apparent today. Example image 87 and 90/series 6. Left apical spiculated nodule measures 1.6 x 1.2 cm on image 32/ series 6. Compare 1.8 x 1.4 cm on the prior. Extension superiorly and anteriorly persists. Left upper lobe ground-glass nodule measures 2.8 x 1.7 cm on image 65/series 6. Felt to be similar to 2.6 x 1.7 cm on  the prior. Left base scarring. Posterior left upper lobe 8 mm spiculated nodule is similar, including image 48/series 6. Musculoskeletal: Marked right shoulder osteoarthritis. Left shoulder hemiarthroplasty. Remote bilateral rib trauma. CT ABDOMEN PELVIS FINDINGS Hepatobiliary: Similar appearance of the liver, with subcentimeter hyperenhancing foci within the high right and left hepatic lobes. small well-circumscribed low-density lesions which are likely cysts. Pneumobilia. Cholecystectomy. Pancreas: Normal, without mass or ductal dilatation. Spleen: Normal in size, without focal abnormality. Adrenals/Urinary Tract: Normal left adrenal gland. Mild right adrenal nodularity is similar over prior exams and likely benign. Mild renal scarring bilaterally, without hydronephrosis. Bladder wall irregularity with small diverticula, similar. Stomach/Bowel: Normal stomach, without wall thickening. Scattered colonic diverticula. Normal small bowel. Vascular/Lymphatic: Advanced aortic and branch vessel atherosclerosis. 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 No abdominopelvic adenopathy. Reproductive: Normal prostate. Other: No significant free fluid. No evidence of omental or peritoneal disease. Musculoskeletal: Mild osteopenia. Left acetabular sclerotic lesions are similar and likely bone islands. Right iliac lucent lesion has been present back to 2015 and can be presumed benign.  Advanced lumbosacral spondylosis. IMPRESSION: CT CHEST IMPRESSION 1. The left upper lobe solid nodule is slightly smaller. No evidence of thoracic adenopathy. 2. Centrilobular emphysema with multiple areas of ground-glass opacity and architectural distortion. A lesion in the right upper lobe measures slightly larger. An area of interstitial thickening in the superior segment right lower lobe demonstrates new or increased nodularity. These both warrant followup attention. 3.  Coronary artery atherosclerosis. Aortic  atherosclerosis. CT ABDOMEN AND PELVIS IMPRESSION 1. No acute process or evidence of metastatic disease in the abdomen or pelvis. 2. Bladder findings which suggest outlet obstruction, as before. 3. 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 Electronically Signed   By: Abigail Miyamoto M.D.   On: 01/03/2017 08:19    ASSESSMENT AND PLAN:  This is a very pleasant 76 years old white male with metastatic non-small cell lung cancer, adenocarcinoma status post induction systemic chemotherapy with carboplatin and Alimta followed by maintenance Alimta discontinued secondary to disease progression. The patient is currently on treatment with immunotherapy with Nivolumab status post 48 cycles. He is tolerating this treatment well. I recommended for him to proceed with cycle #49 today as a scheduled. He would come back for follow-up visit in 2 weeks for evaluation before starting cycle #50. For the heartburn and dyspepsia, I recommended for the patient to take Prilosec over-the-counter. He was advised to call immediately if he has any concerning symptoms in the interval. The patient voices understanding of current disease status and treatment options and is in agreement with the current care plan.  All questions were answered. The patient knows to call the clinic with any problems, questions or concerns. We can certainly see the patient much sooner if necessary. I spent 10 minutes counseling the patient face to face. The total time spent in the appointment was 15 minutes.  Disclaimer: This note was dictated with voice recognition software. Similar sounding words can inadvertently be transcribed and may not be corrected upon review.

## 2017-02-15 ENCOUNTER — Ambulatory Visit (HOSPITAL_BASED_OUTPATIENT_CLINIC_OR_DEPARTMENT_OTHER): Payer: Medicare Other | Admitting: Internal Medicine

## 2017-02-15 ENCOUNTER — Ambulatory Visit (HOSPITAL_BASED_OUTPATIENT_CLINIC_OR_DEPARTMENT_OTHER): Payer: Medicare Other

## 2017-02-15 ENCOUNTER — Encounter: Payer: Self-pay | Admitting: Internal Medicine

## 2017-02-15 ENCOUNTER — Other Ambulatory Visit (HOSPITAL_BASED_OUTPATIENT_CLINIC_OR_DEPARTMENT_OTHER): Payer: Medicare Other

## 2017-02-15 VITALS — BP 158/68 | HR 64 | Temp 97.7°F | Resp 18 | Ht 71.0 in | Wt 157.3 lb

## 2017-02-15 DIAGNOSIS — G620 Drug-induced polyneuropathy: Secondary | ICD-10-CM

## 2017-02-15 DIAGNOSIS — R42 Dizziness and giddiness: Secondary | ICD-10-CM

## 2017-02-15 DIAGNOSIS — C3431 Malignant neoplasm of lower lobe, right bronchus or lung: Secondary | ICD-10-CM

## 2017-02-15 DIAGNOSIS — Z5112 Encounter for antineoplastic immunotherapy: Secondary | ICD-10-CM

## 2017-02-15 LAB — COMPREHENSIVE METABOLIC PANEL
ALT: 12 U/L (ref 0–55)
AST: 20 U/L (ref 5–34)
Albumin: 3.7 g/dL (ref 3.5–5.0)
Alkaline Phosphatase: 104 U/L (ref 40–150)
Anion Gap: 9 mEq/L (ref 3–11)
BUN: 16.9 mg/dL (ref 7.0–26.0)
CHLORIDE: 99 meq/L (ref 98–109)
CO2: 25 mEq/L (ref 22–29)
CREATININE: 0.8 mg/dL (ref 0.7–1.3)
Calcium: 9.4 mg/dL (ref 8.4–10.4)
EGFR: 86 mL/min/{1.73_m2} — ABNORMAL LOW (ref >90)
GLUCOSE: 92 mg/dL (ref 70–140)
POTASSIUM: 4.3 meq/L (ref 3.5–5.1)
SODIUM: 133 meq/L — AB (ref 136–145)
Total Bilirubin: 0.78 mg/dL (ref 0.20–1.20)
Total Protein: 6.1 g/dL — ABNORMAL LOW (ref 6.4–8.3)

## 2017-02-15 LAB — CBC WITH DIFFERENTIAL/PLATELET
BASO%: 0.6 % (ref 0.0–2.0)
Basophils Absolute: 0 10*3/uL (ref 0.0–0.1)
EOS ABS: 0.2 10*3/uL (ref 0.0–0.5)
EOS%: 4 % (ref 0.0–7.0)
HCT: 41 % (ref 38.4–49.9)
HEMOGLOBIN: 14.2 g/dL (ref 13.0–17.1)
LYMPH%: 19.6 % (ref 14.0–49.0)
MCH: 33.7 pg — AB (ref 27.2–33.4)
MCHC: 34.6 g/dL (ref 32.0–36.0)
MCV: 97.5 fL (ref 79.3–98.0)
MONO#: 0.7 10*3/uL (ref 0.1–0.9)
MONO%: 14 % (ref 0.0–14.0)
NEUT%: 61.8 % (ref 39.0–75.0)
NEUTROS ABS: 3.3 10*3/uL (ref 1.5–6.5)
Platelets: 219 10*3/uL (ref 140–400)
RBC: 4.2 10*6/uL (ref 4.20–5.82)
RDW: 14 % (ref 11.0–14.6)
WBC: 5.3 10*3/uL (ref 4.0–10.3)
lymph#: 1 10*3/uL (ref 0.9–3.3)

## 2017-02-15 MED ORDER — SODIUM CHLORIDE 0.9 % IV SOLN
Freq: Once | INTRAVENOUS | Status: AC
  Administered 2017-02-15: 13:00:00 via INTRAVENOUS

## 2017-02-15 MED ORDER — NIVOLUMAB CHEMO INJECTION 100 MG/10ML
240.0000 mg | Freq: Once | INTRAVENOUS | Status: AC
  Administered 2017-02-15: 240 mg via INTRAVENOUS
  Filled 2017-02-15: qty 24

## 2017-02-15 NOTE — Patient Instructions (Signed)
[facility] Discharge Instructions for Patients Receiving Chemotherapy  Today you received the following chemotherapy agents:  Opdivo  To help prevent nausea and vomiting after your treatment, we encourage you to take your nausea medication as prescribed.   If you develop nausea and vomiting that is not controlled by your nausea medication, call the clinic.   BELOW ARE SYMPTOMS THAT SHOULD BE REPORTED IMMEDIATELY:  *FEVER GREATER THAN 100.5 F  *CHILLS WITH OR WITHOUT FEVER  NAUSEA AND VOMITING THAT IS NOT CONTROLLED WITH YOUR NAUSEA MEDICATION  *UNUSUAL SHORTNESS OF BREATH  *UNUSUAL BRUISING OR BLEEDING  TENDERNESS IN MOUTH AND THROAT WITH OR WITHOUT PRESENCE OF ULCERS  *URINARY PROBLEMS  *BOWEL PROBLEMS  UNUSUAL RASH Items with * indicate a potential emergency and should be followed up as soon as possible.  Feel free to call the clinic you have any questions or concerns. The clinic phone number is [phone].  Please show the CHEMO ALERT CARD at check-in to the Emergency Department and triage nurse.   

## 2017-02-15 NOTE — Progress Notes (Signed)
[place] [phone]   Fax:([phone]) [place], [place] [address]  DIAGNOSIS: Stage IV (T1a, N0, M1b) non-small cell lung cancer, adenocarcinoma of the right lower lobe diagnosed in October 2014  Molecular profile: Negative forRET, ALK, BRAF, MET, EGFR.  Positive for ERBB2 A775T, [id number], KRAS G13E, MAP2K1 D67N (see full report).   PRIOR THERAPY:  1) Systemic chemotherapy with carboplatin for AUC of 5 and Alimta 500 mg/M2 every 3 weeks, status post 6 cycles with stable disease. First dose on 10/23/2013. 2) Maintenance systemic chemotherapy with single agent Alimta 500 mg/M2 every 3 weeks. Status post 8 cycles.  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 Sondra Come completed on 12/16/2014. 4) stereotactic radiotherapy to the enlarging right upper lobe lung nodule under the care of Dr. Sondra Come completed on 11/22/2016  CURRENT THERAPY: Immunotherapy with Nivolumab 240 mg IV every 2 weeks, status post 49 cycles. First cycle was given on 01/28/2015.  INTERVAL HISTORY: Anthony Curry. 76 y.o. male came to the clinic today for follow-up visit. The patient is feeling fine today with no specific complaints except for the persistent peripheral neuropathy. He also has some dizzy spells. He denied having any chest pain, shortness breath, cough or hemoptysis. He has no nausea or vomiting. He denied having any fever or chills. He is here today for evaluation before starting the next cycle of his immunotherapy.  MEDICAL HISTORY: Past Medical History:  Diagnosis Date  . 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[place])   . Adrenal mass ([place])   . Alcoholism in remission ([place])   . Anemia of chronic disease   . 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 Anxiety   . BPH (benign prostatic hyperplasia)   . Cancer ([place])    small cell/lung  . Complication of  anesthesia 09/08/2015    JITTERY AFTER GALLBLADDER SURGERY  . GERD (gastroesophageal reflux disease)   . History of radiation therapy 11/14/16, 11/18/16, 11/22/16   Left upper lung: 54 Gy in 3 fractions  . Hypertension   . Hypertension 12/07/2016  . Neoplastic malignant related fatigue    IV tx. every 2 weeks last9-14-16 (Dr. Earlie Server), radiation last tx. 6 months  . 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 Persistent atrial fibrillation ([place])   . Pulmonary nodules   . Radiation 12/02/14-12/16/14   Left adrenal gland 30 Gy in 10 fractions    ALLERGIES:  has No Known Allergies.  MEDICATIONS:  Current Outpatient Prescriptions  Medication Sig Dispense Refill  . ALPRAZolam (XANAX) 0.25 MG tablet Take 1 tablet (0.25 mg total) by mouth 3 (three) times daily as needed for anxiety or sleep. And also at night for sleep. 90 tablet 0  . apixaban (ELIQUIS) 5 MG TABS tablet Take 5 mg by mouth 2 (two) times daily.    . Ascorbic Acid (VITAMIN C PO) Take 1,000 mg by mouth daily.     . B Complex-C (SUPER B COMPLEX PO) Take 1 each by mouth daily.     . calcium carbonate (OS-CAL) 600 MG TABS Take 600 mg by mouth daily with breakfast.    . demeclocycline (DECLOMYCIN) 150 MG tablet Take 150 mg by mouth 2 (two) times daily.    [name] diltiazem (CARDIZEM CD) 180 MG 24 hr capsule Take 1 capsule (180 mg total) by mouth daily at 12 noon. 90 capsule 3  . furosemide (LASIX) 40 MG  tablet Take 0.5 tablets (20 mg total) by mouth every other day.    [name] HYDROcodone-acetaminophen (NORCO/VICODIN) 5-325 MG tablet Take 1-2 tablets by mouth every 4 (four) hours as needed for moderate pain. 30 tablet 0  . KLOR-CON M20 20 MEQ tablet Take 20 mEq by mouth every morning.     . Lactobacillus (ACIDOPHILUS) CAPS capsule Take 1 capsule by mouth daily.    [name] MAGNESIUM PO Take 400 mg by mouth daily.     . Multiple Vitamin (MULTIVITAMIN WITH MINERALS) TABS Take 1 tablet by mouth daily.    . Nivolumab (OPDIVO IV) Inject as directed every 2  weeks    . Nutritional Supplements (ENSURE COMPLETE PO) Take 1 Can by mouth 2 (two) times daily.    . Omega-3 Fatty Acids (FISH OIL PO) Take 1 tablet by mouth daily.    . pantoprazole (PROTONIX) 40 MG tablet Take 40 mg by mouth every morning.  8  . tamsulosin (FLOMAX) 0.4 MG CAPS capsule Take 0.4 mg by mouth daily at 12 noon.     . thiamine (VITAMIN B-1) 100 MG tablet Take 1 tablet (100 mg total) by mouth daily. 30 tablet 6  . vitamin E 400 UNIT capsule Take 400 Units by mouth daily.     No current facility-administered medications for this visit.    Facility-Administered Medications Ordered in Other Visits  Medication Dose Route Frequency Provider Last Rate Last Dose  . 0.9 %  sodium chloride infusion   Intravenous Once [name], MD        SURGICAL HISTORY:  Past Surgical History:  Procedure Laterality Date  . ANKLE ARTHRODESIS  1995   rt fx-hardware in  . CARDIOVERSION N/A 09/29/2014   Procedure: CARDIOVERSION;  Surgeon: Josue Hector, MD;  Location[facility] ENDOSCOPY;  Service: Cardiovascular;  Laterality: N/A;  . CHOLECYSTECTOMY  09/08/2015    laproscopic   . CHOLECYSTECTOMY N/A 09/08/2015   Procedure: LAPAROSCOPIC CHOLECYSTECTOMY WITH ATTEMPTED INTRAOPERATIVE CHOLANGIOGRAM ;  Surgeon: Fanny Skates, MD;  Location: [facility];  Service: General;  Laterality: N/A;  . COLONOSCOPY    . ERCP N/A 09/02/2015   Procedure: ENDOSCOPIC RETROGRADE CHOLANGIOPANCREATOGRAPHY (ERCP);  Surgeon: Arta Silence, MD;  Location: [facility] ENDOSCOPY;  Service: Endoscopy;  Laterality: N/A;  . ERCP N/A 09/07/2015   Procedure: ENDOSCOPIC RETROGRADE CHOLANGIOPANCREATOGRAPHY (ERCP);  Surgeon: Clarene Essex, MD;  Location: [facility] ENDOSCOPY;  Service: Endoscopy;  Laterality: N/A;  . EXCISION / CURETTAGE BONE CYST FINGER  2005   rt thumb  . HEMORRHOID SURGERY    . Percutaneous Cholecytostomy tube     IVR insertion 06-13-15(Dr. Vernard Gambles)- 08-28-15 remains in place to drainage bag  . TONSILLECTOMY    . TOTAL SHOULDER  ARTHROPLASTY  02/03/2012   Procedure: TOTAL SHOULDER ARTHROPLASTY;  Surgeon: [name], MD;  Location: [facility];  Service: Orthopedics;  Laterality: Left;    REVIEW OF SYSTEMS:  A comprehensive review of systems was negative except for: Constitutional: positive for fatigue Neurological: positive for paresthesia   PHYSICAL EXAMINATION: General appearance: alert, cooperative, fatigued and no distress Head: Normocephalic, without obvious abnormality, atraumatic Neck: no adenopathy, no JVD, supple, symmetrical, trachea midline and thyroid not enlarged, symmetric, no tenderness/mass/nodules Lymph nodes: Cervical, supraclavicular, and axillary nodes normal. Resp: clear to auscultation bilaterally Back: symmetric, no curvature. ROM normal. No CVA tenderness. Cardio: regular rate and rhythm, S1, S2 normal, no murmur, click, rub or gallop GI: soft, non-tender; bowel sounds normal; no masses,  no organomegaly Extremities: extremities normal, atraumatic, no cyanosis  or edema  ECOG PERFORMANCE STATUS: 1 - Symptomatic but completely ambulatory  Blood pressure (!) 158/68, pulse 64, temperature 97.7 F (36.5 C), temperature source Oral, resp. rate 18, height 5' 11"  (1.803 m), weight 157 lb 4.8 oz (71.4 kg), SpO2 100 %.  LABORATORY DATA: Lab Results  Component Value Date   WBC 5.3 02/15/2017   HGB 14.2 02/15/2017   HCT 41.0 02/15/2017   MCV 97.5 02/15/2017   PLT 219 02/15/2017      Chemistry      Component Value Date/Time   NA 131 (L) 02/01/2017 1029   K 3.9 02/01/2017 1029   CL 101 09/14/2016 1319   CO2 24 02/01/2017 1029   BUN 17.7 02/01/2017 1029   CREATININE 0.9 02/01/2017 1029      Component Value Date/Time   CALCIUM 9.8 02/01/2017 1029   ALKPHOS 101 02/01/2017 1029   AST 22 02/01/2017 1029   ALT 15 02/01/2017 1029   BILITOT 0.93 02/01/2017 1029       RADIOGRAPHIC STUDIES: No results found.  ASSESSMENT AND PLAN:  This is a very pleasant 76 years old  white male with metastatic non-small cell lung cancer, adenocarcinoma status post previous systemic chemotherapy and he is currently on treatment with immunotherapy with Nivolumab status post 49 cycles. The patient is tolerating the treatment well with no significant adverse effects. I recommended for him to proceed with cycle #50 today as a scheduled. I will see him back for follow-up visit in 2 weeks for evaluation after repeating CT scan of the chest, abdomen and pelvis for restaging of his disease. He is interested on the 4 weekly schedule. I will try to arrange the new dosing for the patient starting from the next cycle. He was advised to call immediately if he has any concerning symptoms in the interval. The patient voices understanding of current disease status and treatment options and is in agreement with the current care plan.  All questions were answered. The patient knows to call the clinic with any problems, questions or concerns. We can certainly see the patient much sooner if necessary. I spent 10 minutes counseling the patient face to face. The total time spent in the appointment was 15 minutes.  Disclaimer: This note was dictated with voice recognition software. Similar sounding words can inadvertently be transcribed and may not be corrected upon review.

## 2017-02-27 ENCOUNTER — Encounter (HOSPITAL_COMMUNITY): Payer: Self-pay

## 2017-02-27 ENCOUNTER — Ambulatory Visit (HOSPITAL_COMMUNITY)
Admission: RE | Admit: 2017-02-27 | Discharge: 2017-02-27 | Disposition: A | Discharge status: Home / Self Care | Payer: Medicare Other | Source: Ambulatory Visit | Attending: Internal Medicine | Admitting: Internal Medicine

## 2017-02-27 DIAGNOSIS — N3289 Other specified disorders of bladder: Secondary | ICD-10-CM | POA: Diagnosis not present

## 2017-02-27 DIAGNOSIS — C3431 Malignant neoplasm of lower lobe, right bronchus or lung: Secondary | ICD-10-CM | POA: Diagnosis not present

## 2017-02-27 DIAGNOSIS — J439 Emphysema, unspecified: Secondary | ICD-10-CM | POA: Diagnosis not present

## 2017-02-27 DIAGNOSIS — C3490 Malignant neoplasm of unspecified part of unspecified bronchus or lung: Secondary | ICD-10-CM | POA: Diagnosis not present

## 2017-02-27 DIAGNOSIS — Z5112 Encounter for antineoplastic immunotherapy: Secondary | ICD-10-CM | POA: Diagnosis not present

## 2017-02-27 DIAGNOSIS — I700 Atherosclerosis of aorta: Secondary | ICD-10-CM | POA: Diagnosis not present

## 2017-02-27 DIAGNOSIS — K7689 Other specified diseases of liver: Secondary | ICD-10-CM | POA: Diagnosis not present

## 2017-02-27 MED ORDER — IOPAMIDOL (ISOVUE-300) INJECTION 61%
INTRAVENOUS | Status: AC
  Filled 2017-02-27: qty 100

## 2017-02-27 MED ORDER — IOPAMIDOL (ISOVUE-300) INJECTION 61%
100.0000 mL | Freq: Once | INTRAVENOUS | Status: AC | PRN
  Administered 2017-02-27: 100 mL via INTRAVENOUS

## 2017-02-28 ENCOUNTER — Other Ambulatory Visit: Payer: Self-pay

## 2017-02-28 DIAGNOSIS — C3431 Malignant neoplasm of lower lobe, right bronchus or lung: Secondary | ICD-10-CM

## 2017-03-01 ENCOUNTER — Encounter: Payer: Self-pay | Admitting: Internal Medicine

## 2017-03-01 ENCOUNTER — Ambulatory Visit (HOSPITAL_BASED_OUTPATIENT_CLINIC_OR_DEPARTMENT_OTHER): Payer: Medicare Other | Admitting: Internal Medicine

## 2017-03-01 ENCOUNTER — Ambulatory Visit: Payer: Medicare Other | Admitting: Internal Medicine

## 2017-03-01 VITALS — BP 147/64 | HR 69 | Temp 97.5°F | Resp 18 | Ht 71.0 in | Wt 155.1 lb

## 2017-03-01 DIAGNOSIS — Z79899 Other long term (current) drug therapy: Secondary | ICD-10-CM | POA: Diagnosis not present

## 2017-03-01 DIAGNOSIS — Z5112 Encounter for antineoplastic immunotherapy: Secondary | ICD-10-CM | POA: Diagnosis not present

## 2017-03-01 DIAGNOSIS — C3431 Malignant neoplasm of lower lobe, right bronchus or lung: Secondary | ICD-10-CM

## 2017-03-01 DIAGNOSIS — J984 Other disorders of lung: Secondary | ICD-10-CM | POA: Diagnosis not present

## 2017-03-01 DIAGNOSIS — H353221 Exudative age-related macular degeneration, left eye, with active choroidal neovascularization: Secondary | ICD-10-CM | POA: Diagnosis not present

## 2017-03-01 DIAGNOSIS — I10 Essential (primary) hypertension: Secondary | ICD-10-CM | POA: Diagnosis not present

## 2017-03-01 DIAGNOSIS — F419 Anxiety disorder, unspecified: Secondary | ICD-10-CM

## 2017-03-01 DIAGNOSIS — I4891 Unspecified atrial fibrillation: Secondary | ICD-10-CM | POA: Diagnosis not present

## 2017-03-01 DIAGNOSIS — I482 Chronic atrial fibrillation, unspecified: Secondary | ICD-10-CM

## 2017-03-01 LAB — CBC WITH DIFFERENTIAL/PLATELET
BASO%: 0.7 % (ref 0.0–2.0)
BASOS ABS: 0 10*3/uL (ref 0.0–0.1)
EOS ABS: 0.1 10*3/uL (ref 0.0–0.5)
EOS%: 3.2 % (ref 0.0–7.0)
HEMATOCRIT: 43 % (ref 38.4–49.9)
HGB: 14.8 g/dL (ref 13.0–17.1)
LYMPH%: 20.4 % (ref 14.0–49.0)
MCH: 33.4 pg (ref 27.2–33.4)
MCHC: 34.3 g/dL (ref 32.0–36.0)
MCV: 97.2 fL (ref 79.3–98.0)
MONO#: 0.4 10*3/uL (ref 0.1–0.9)
MONO%: 9.3 % (ref 0.0–14.0)
NEUT#: 3 10*3/uL (ref 1.5–6.5)
NEUT%: 66.4 % (ref 39.0–75.0)
PLATELETS: 225 10*3/uL (ref 140–400)
RBC: 4.43 10*6/uL (ref 4.20–5.82)
RDW: 13.9 % (ref 11.0–14.6)
WBC: 4.5 10*3/uL (ref 4.0–10.3)
lymph#: 0.9 10*3/uL (ref 0.9–3.3)

## 2017-03-01 LAB — COMPREHENSIVE METABOLIC PANEL
ALT: 17 U/L (ref 0–55)
ANION GAP: 12 meq/L — AB (ref 3–11)
AST: 23 U/L (ref 5–34)
Albumin: 4.1 g/dL (ref 3.5–5.0)
Alkaline Phosphatase: 98 U/L (ref 40–150)
BUN: 15.9 mg/dL (ref 7.0–26.0)
CALCIUM: 9.8 mg/dL (ref 8.4–10.4)
CHLORIDE: 99 meq/L (ref 98–109)
CO2: 24 meq/L (ref 22–29)
Creatinine: 0.8 mg/dL (ref 0.7–1.3)
EGFR: 85 mL/min/{1.73_m2} — AB (ref >90)
Glucose: 88 mg/dl (ref 70–140)
Potassium: 4.4 mEq/L (ref 3.5–5.1)
Sodium: 134 mEq/L — ABNORMAL LOW (ref 136–145)
Total Bilirubin: 1.01 mg/dL (ref 0.20–1.20)
Total Protein: 6.7 g/dL (ref 6.4–8.3)

## 2017-03-01 LAB — TSH: TSH: 4.702 m[IU]/L — AB (ref 0.320–4.118)

## 2017-03-01 MED ORDER — ALPRAZOLAM 0.25 MG PO TABS: 0.2500 mg | ORAL_TABLET | Freq: Three times a day (TID) | ORAL | 0 refills | Status: DC | PRN

## 2017-03-01 MED ORDER — SODIUM CHLORIDE 0.9 % IV SOLN
Freq: Once | INTRAVENOUS | Status: AC
  Administered 2017-03-01: 13:00:00 via INTRAVENOUS

## 2017-03-01 MED ORDER — NIVOLUMAB CHEMO INJECTION 100 MG/10ML
240.0000 mg | Freq: Once | INTRAVENOUS | Status: AC
  Administered 2017-03-01: 240 mg via INTRAVENOUS
  Filled 2017-03-01: qty 24

## 2017-03-01 NOTE — Progress Notes (Signed)
[facility] [phone]) [phone]   Fax:([phone]) [place] [address]  DIAGNOSIS: Stage IV (T1a, N0, M1b) non-small cell lung cancer, adenocarcinoma of the right lower lobe diagnosed in October 2014  Molecular profile: Negative forRET, ALK, BRAF, MET, EGFR.  Positive for ERBB2 A775T, [id number], KRAS G13E, MAP2K1 D67N (see full report).   PRIOR THERAPY:  1) Systemic chemotherapy with carboplatin for AUC of 5 and Alimta 500 mg/M2 every 3 weeks, status post 6 cycles with stable disease. First dose on 10/23/2013. 2) Maintenance systemic chemotherapy with single agent Alimta 500 mg/M2 every 3 weeks. Status post 8 cycles.  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 Sondra Come completed on 12/16/2014. 4) stereotactic radiotherapy to the enlarging right upper lobe lung nodule under the care of Dr. Sondra Come completed on 11/22/2016  CURRENT THERAPY: Immunotherapy with Nivolumab 240 mg IV every 2 weeks, status post 50 cycles. First cycle was given on 01/28/2015.  INTERVAL HISTORY: Anthony Curry. 76 y.o. male returns to the clinic today for follow-up visit, by his wife. The patient is feeling fine today with no specific complaints except for redness in his hands. He changes his soap recently. He is tolerating his current treatment with immunotherapy fairly well. He denied having any chest pain, shortness of breath, cough or hemoptysis. He denied having any fever or chills. He has no nausea, vomiting, diarrhea or constipation. He denied having any weight loss or night sweats. He had repeat CT scan of the chest, abdomen and pelvis performed recently and he is here for evaluation and discussion of his scan results.   MEDICAL HISTORY: Past Medical History:  Diagnosis Date  . 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[place])   . Adrenal mass ([place])   . Alcoholism in remission  ([place])   . Anemia of chronic disease   . 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 Anxiety   . BPH (benign prostatic hyperplasia)   . Cancer ([place])    small cell/lung  . Complication of anesthesia 09/08/2015    JITTERY AFTER GALLBLADDER SURGERY  . GERD (gastroesophageal reflux disease)   . History of radiation therapy 11/14/16, 11/18/16, 11/22/16   Left upper lung: 54 Gy in 3 fractions  . Hypertension   . Hypertension 12/07/2016  . Neoplastic malignant related fatigue    IV tx. every 2 weeks last9-14-16 (Dr. Earlie Server), radiation last tx. 6 months  . 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 Persistent atrial fibrillation ([place])   . Pulmonary nodules   . Radiation 12/02/14-12/16/14   Left adrenal gland 30 Gy in 10 fractions    ALLERGIES:  has No Known Allergies.  MEDICATIONS:  Current Outpatient Prescriptions  Medication Sig Dispense Refill  . ALPRAZolam (XANAX) 0.25 MG tablet Take 1 tablet (0.25 mg total) by mouth 3 (three) times daily as needed for anxiety or sleep. And also at night for sleep. 90 tablet 0  . apixaban (ELIQUIS) 5 MG TABS tablet Take 5 mg by mouth 2 (two) times daily.    . Ascorbic Acid (VITAMIN C PO) Take 1,000 mg by mouth daily.     . B Complex-C (SUPER B COMPLEX PO) Take 1 each by mouth daily.     . calcium carbonate (OS-CAL) 600 MG TABS Take 600 mg by mouth daily with breakfast.    . demeclocycline (DECLOMYCIN) 150 MG tablet Take 150 mg by mouth 2 (  two) times daily.    [name] diltiazem (CARDIZEM CD) 180 MG 24 hr capsule Take 1 capsule (180 mg total) by mouth daily at 12 noon. 90 capsule 3  . furosemide (LASIX) 40 MG tablet Take 0.5 tablets (20 mg total) by mouth every other day.    [name] HYDROcodone-acetaminophen (NORCO/VICODIN) 5-325 MG tablet Take 1-2 tablets by mouth every 4 (four) hours as needed for moderate pain. 30 tablet 0  . KLOR-CON M20 20 MEQ tablet Take 20 mEq by mouth every morning.     . Lactobacillus (ACIDOPHILUS) CAPS capsule Take 1 capsule by  mouth daily.    [name] MAGNESIUM PO Take 400 mg by mouth daily.     . Multiple Vitamin (MULTIVITAMIN WITH MINERALS) TABS Take 1 tablet by mouth daily.    . Nivolumab (OPDIVO IV) Inject as directed every 2 weeks    . Nutritional Supplements (ENSURE COMPLETE PO) Take 1 Can by mouth 2 (two) times daily.    . Omega-3 Fatty Acids (FISH OIL PO) Take 1 tablet by mouth daily.    . pantoprazole (PROTONIX) 40 MG tablet Take 40 mg by mouth every morning.  8  . tamsulosin (FLOMAX) 0.4 MG CAPS capsule Take 0.4 mg by mouth daily at 12 noon.     . thiamine (VITAMIN B-1) 100 MG tablet Take 1 tablet (100 mg total) by mouth daily. 30 tablet 6  . vitamin E 400 UNIT capsule Take 400 Units by mouth daily.     No current facility-administered medications for this visit.    Facility-Administered Medications Ordered in Other Visits  Medication Dose Route Frequency Provider Last Rate Last Dose  . 0.9 %  sodium chloride infusion   Intravenous Once [name], MD        SURGICAL HISTORY:  Past Surgical History:  Procedure Laterality Date  . ANKLE ARTHRODESIS  1995   rt fx-hardware in  . CARDIOVERSION N/A 09/29/2014   Procedure: CARDIOVERSION;  Surgeon: Josue Hector, MD;  Location: [facility] ENDOSCOPY;  Service: Cardiovascular;  Laterality: N/A;  . CHOLECYSTECTOMY  09/08/2015    laproscopic   . CHOLECYSTECTOMY N/A 09/08/2015   Procedure: LAPAROSCOPIC CHOLECYSTECTOMY WITH ATTEMPTED INTRAOPERATIVE CHOLANGIOGRAM ;  Surgeon: Fanny Skates, MD;  Location: [facility];  Service: General;  Laterality: N/A;  . COLONOSCOPY    . ERCP N/A 09/02/2015   Procedure: ENDOSCOPIC RETROGRADE CHOLANGIOPANCREATOGRAPHY (ERCP);  Surgeon: Arta Silence, MD;  Location: [facility] ENDOSCOPY;  Service: Endoscopy;  Laterality: N/A;  . ERCP N/A 09/07/2015   Procedure: ENDOSCOPIC RETROGRADE CHOLANGIOPANCREATOGRAPHY (ERCP);  Surgeon: Clarene Essex, MD;  Location: [facility] ENDOSCOPY;  Service: Endoscopy;  Laterality: N/A;  . EXCISION / CURETTAGE BONE CYST FINGER  2005     rt thumb  . HEMORRHOID SURGERY    . Percutaneous Cholecytostomy tube     IVR insertion 06-13-15(Dr. Vernard Gambles)- 08-28-15 remains in place to drainage bag  . TONSILLECTOMY    . TOTAL SHOULDER ARTHROPLASTY  02/03/2012   Procedure: TOTAL SHOULDER ARTHROPLASTY;  Surgeon: [name], MD;  Location: [facility];  Service: Orthopedics;  Laterality: Left;    REVIEW OF SYSTEMS:  Constitutional: negative Eyes: negative Ears, nose, mouth, throat, and face: negative Respiratory: negative Cardiovascular: negative Gastrointestinal: negative Genitourinary:negative Integument/breast: positive for rash Hematologic/lymphatic: negative Musculoskeletal:negative Neurological: negative Behavioral/Psych: negative Endocrine: negative Allergic/Immunologic: negative   PHYSICAL EXAMINATION: General appearance: alert, cooperative and no distress Head: Normocephalic, without obvious abnormality, atraumatic Neck: no adenopathy, no JVD, supple, symmetrical, trachea midline and thyroid not enlarged, symmetric, no tenderness/mass/nodules Lymph  nodes: Cervical, supraclavicular, and axillary nodes normal. Resp: clear to auscultation bilaterally Back: symmetric, no curvature. ROM normal. No CVA tenderness. Cardio: regular rate and rhythm, S1, S2 normal, no murmur, click, rub or gallop GI: soft, non-tender; bowel sounds normal; no masses,  no organomegaly Extremities: extremities normal, atraumatic, no cyanosis or edema Neurologic: Alert and oriented X 3, normal strength and tone. Normal symmetric reflexes. Normal coordination and gait  ECOG PERFORMANCE STATUS: 1 - Symptomatic but completely ambulatory  Blood pressure (!) 147/64, pulse 69, temperature 97.5 F (36.4 C), temperature source Oral, resp. rate 18, height 5' 11"  (1.803 m), weight 155 lb 1.6 oz (70.4 kg), SpO2 100 %.  LABORATORY DATA: Lab Results  Component Value Date   WBC 4.5 03/01/2017   HGB 14.8 03/01/2017   HCT 43.0 03/01/2017    MCV 97.2 03/01/2017   PLT 225 03/01/2017      Chemistry      Component Value Date/Time   NA 134 (L) 03/01/2017 1137   K 4.4 03/01/2017 1137   CL 101 09/14/2016 1319   CO2 24 03/01/2017 1137   BUN 15.9 03/01/2017 1137   CREATININE 0.8 03/01/2017 1137      Component Value Date/Time   CALCIUM 9.8 03/01/2017 1137   ALKPHOS 98 03/01/2017 1137   AST 23 03/01/2017 1137   ALT 17 03/01/2017 1137   BILITOT 1.01 03/01/2017 1137       RADIOGRAPHIC STUDIES: Ct Chest W Contrast  Result Date: 02/27/2017 CLINICAL DATA:  Lung cancer. EXAM: CT CHEST, ABDOMEN, AND PELVIS WITH CONTRAST TECHNIQUE: Multidetector CT imaging of the chest, abdomen and pelvis was performed following the standard protocol during bolus administration of intravenous contrast. CONTRAST:  172m ISOVUE-300 IOPAMIDOL (ISOVUE-300) INJECTION 61% COMPARISON:  01/02/2017 FINDINGS: CT CHEST FINDINGS Cardiovascular: Heart size normal. Trace pericardial effusion is stable. Coronary artery calcification is noted. Atherosclerotic calcification is noted in the wall of the thoracic aorta. Mediastinum/Nodes: No mediastinal lymphadenopathy. There is no hilar lymphadenopathy. The esophagus has normal imaging features. There is no axillary lymphadenopathy. Lungs/Pleura: Previously seen 12 x 16 mm medial left upper lobe lesion now measures 15 x 18 mm. There is new surrounding airspace disease in the medial left apex, likely radiation related. Changes of emphysema persist. Multiple scattered areas of architectural distortion/ground-glass attenuation in both lungs show no substantial interval change. Index lesion left upper lobe is 17 x 22 mm today compared to 17 x 28 mm previously. Index right upper lobe lesion is 32 x 21 mm today compared to 28 x 22 mm previously. Musculoskeletal: Multiple nonacute posterior right rib fractures are evident. Patient is status post left shoulder replacement. Advanced degenerative changes right shoulder. Advanced  degenerative changes lower thoracic spine. CT ABDOMEN PELVIS FINDINGS Hepatobiliary: Hypoattenuating lesions inferior right liver are stable. The tiny hypervascular foci identified in both the right and left lobes of liver are unchanged. Gallbladder surgically absent. No intrahepatic or extrahepatic biliary dilation. Pancreas: No focal mass lesion. No dilatation of the main duct. No intraparenchymal cyst. No peripancreatic edema. Spleen: No splenomegaly. No focal mass lesion. Adrenals/Urinary Tract: No adrenal nodule or mass. Cortical scarring noted both kidneys without suspicious or worrisome enhancing lesion. No evidence for hydroureter. Bladder is moderately distended with wall irregularity/ trabeculation and apparent bilateral diverticuli. Stomach/Bowel: Stomach is nondistended. No gastric wall thickening. No evidence of outlet obstruction. Duodenum is normally positioned as is the ligament of Treitz. No small bowel wall thickening. No small bowel dilatation. The terminal ileum is normal. The appendix is normal.  No gross colonic mass. No colonic wall thickening. No substantial diverticular change. Vascular/Lymphatic: There is abdominal aortic atherosclerosis without aneurysm. There is no gastrohepatic or hepatoduodenal ligament lymphadenopathy. No intraperitoneal or retroperitoneal lymphadenopathy. No pelvic sidewall lymphadenopathy. Reproductive: The prostate gland and seminal vesicles have normal imaging features. Other: No intraperitoneal free fluid. Musculoskeletal: 14 mm lucent lesion in the right iliac bone is stable. Advanced degenerative changes noted lumbar spine. IMPRESSION: 1. Left upper lobe lesion is stable to minimally increased in the interval with development of surrounding confluent airspace disease. These changes are likely all related to treatment/radiation and continued attention on follow-up recommended. 2. No substantial change in the areas of ground-glass attenuation/architectural  distortion in both lungs. Continued attention on follow-up recommended. 3.  Abdominal Aortic Atherosclerois (ICD10-170.0) 4.  Emphysema. (ICD10-J43.9) 5. Changes in the bladder wall suggest bladder outlet obstruction. Electronically Signed   By: Misty Stanley M.D.   On: 02/27/2017 16:31   Ct Abdomen Pelvis W Contrast  Result Date: 02/27/2017 CLINICAL DATA:  Lung cancer. EXAM: CT CHEST, ABDOMEN, AND PELVIS WITH CONTRAST TECHNIQUE: Multidetector CT imaging of the chest, abdomen and pelvis was performed following the standard protocol during bolus administration of intravenous contrast. CONTRAST:  1100m ISOVUE-300 IOPAMIDOL (ISOVUE-300) INJECTION 61% COMPARISON:  01/02/2017 FINDINGS: CT CHEST FINDINGS Cardiovascular: Heart size normal. Trace pericardial effusion is stable. Coronary artery calcification is noted. Atherosclerotic calcification is noted in the wall of the thoracic aorta. Mediastinum/Nodes: No mediastinal lymphadenopathy. There is no hilar lymphadenopathy. The esophagus has normal imaging features. There is no axillary lymphadenopathy. Lungs/Pleura: Previously seen 12 x 16 mm medial left upper lobe lesion now measures 15 x 18 mm. There is new surrounding airspace disease in the medial left apex, likely radiation related. Changes of emphysema persist. Multiple scattered areas of architectural distortion/ground-glass attenuation in both lungs show no substantial interval change. Index lesion left upper lobe is 17 x 22 mm today compared to 17 x 28 mm previously. Index right upper lobe lesion is 32 x 21 mm today compared to 28 x 22 mm previously. Musculoskeletal: Multiple nonacute posterior right rib fractures are evident. Patient is status post left shoulder replacement. Advanced degenerative changes right shoulder. Advanced degenerative changes lower thoracic spine. CT ABDOMEN PELVIS FINDINGS Hepatobiliary: Hypoattenuating lesions inferior right liver are stable. The tiny hypervascular foci identified in  both the right and left lobes of liver are unchanged. Gallbladder surgically absent. No intrahepatic or extrahepatic biliary dilation. Pancreas: No focal mass lesion. No dilatation of the main duct. No intraparenchymal cyst. No peripancreatic edema. Spleen: No splenomegaly. No focal mass lesion. Adrenals/Urinary Tract: No adrenal nodule or mass. Cortical scarring noted both kidneys without suspicious or worrisome enhancing lesion. No evidence for hydroureter. Bladder is moderately distended with wall irregularity/ trabeculation and apparent bilateral diverticuli. Stomach/Bowel: Stomach is nondistended. No gastric wall thickening. No evidence of outlet obstruction. Duodenum is normally positioned as is the ligament of Treitz. No small bowel wall thickening. No small bowel dilatation. The terminal ileum is normal. The appendix is normal. No gross colonic mass. No colonic wall thickening. No substantial diverticular change. Vascular/Lymphatic: There is abdominal aortic atherosclerosis without aneurysm. There is no gastrohepatic or hepatoduodenal ligament lymphadenopathy. No intraperitoneal or retroperitoneal lymphadenopathy. No pelvic sidewall lymphadenopathy. Reproductive: The prostate gland and seminal vesicles have normal imaging features. Other: No intraperitoneal free fluid. Musculoskeletal: 14 mm lucent lesion in the right iliac bone is stable. Advanced degenerative changes noted lumbar spine. IMPRESSION: 1. Left upper lobe lesion is stable to minimally increased in  the interval with development of surrounding confluent airspace disease. These changes are likely all related to treatment/radiation and continued attention on follow-up recommended. 2. No substantial change in the areas of ground-glass attenuation/architectural distortion in both lungs. Continued attention on follow-up recommended. 3.  Abdominal Aortic Atherosclerois (ICD10-170.0) 4.  Emphysema. (ICD10-J43.9) 5. Changes in the bladder wall suggest  bladder outlet obstruction. Electronically Signed   By: Misty Stanley M.D.   On: 02/27/2017 16:31    ASSESSMENT AND PLAN:  This is a very pleasant 76 years old white male with metastatic non-small cell lung cancer, adenocarcinoma status post systemic chemotherapy with carboplatin and Alimta followed by maintenance Alimta discontinued secondary to disease progression. He is currently undergoing second line treatment with immunotherapy with Nivolumab status post 50 cycles. The patient is tolerating his treatment well with no significant adverse effects. He had repeat CT scan of the chest, abdomen and pelvis performed recently. I personally and independently reviewed the scan images and discuss the results with the patient and his wife. His scan showed no significant evidence for disease progression except for mild increase in the left upper lobe lesion. I recommended for the patient to continue his current treatment with immunotherapy and he will receive cycle #51 today. I will continue to monitor the left upper lobe lung lesion closely on the upcoming scans. For anxiety, I gave the patient refill of Xanax. For hypertension, he will continue his current treatment with Cardizem and Lasix For the history of atrial fibrillation he is currently on Eliquis. The patient would come back for follow-up visit in 2 weeks for evaluation before starting cycle #52. The patient voices understanding of current disease status and treatment options and is in agreement with the current care plan.  All questions were answered. The patient knows to call the clinic with any problems, questions or concerns. We can certainly see the patient much sooner if necessary.  Disclaimer: This note was dictated with voice recognition software. Similar sounding words can inadvertently be transcribed and may not be corrected upon review.

## 2017-03-01 NOTE — Patient Instructions (Signed)
[facility] Discharge Instructions for Patients Receiving Chemotherapy  Today you received the following chemotherapy agents:  Nivolumab.  To help prevent nausea and vomiting after your treatment, we encourage you to take your nausea medication as directed.   If you develop nausea and vomiting that is not controlled by your nausea medication, call the clinic.   BELOW ARE SYMPTOMS THAT SHOULD BE REPORTED IMMEDIATELY:  *FEVER GREATER THAN 100.5 F  *CHILLS WITH OR WITHOUT FEVER  NAUSEA AND VOMITING THAT IS NOT CONTROLLED WITH YOUR NAUSEA MEDICATION  *UNUSUAL SHORTNESS OF BREATH  *UNUSUAL BRUISING OR BLEEDING  TENDERNESS IN MOUTH AND THROAT WITH OR WITHOUT PRESENCE OF ULCERS  *URINARY PROBLEMS  *BOWEL PROBLEMS  UNUSUAL RASH Items with * indicate a potential emergency and should be followed up as soon as possible.  Feel free to call the clinic you have any questions or concerns. The clinic phone number is [phone].  Please show the CHEMO ALERT CARD at check-in to the Emergency Department and triage nurse.   

## 2017-03-03 ENCOUNTER — Telehealth: Payer: Self-pay | Admitting: Internal Medicine

## 2017-03-03 NOTE — Telephone Encounter (Signed)
Scheduled appts per 03/01/2017 los. Patient to pick up new schedule when they come in on 4/11.

## 2017-03-15 ENCOUNTER — Encounter: Payer: Self-pay | Admitting: Internal Medicine

## 2017-03-15 ENCOUNTER — Ambulatory Visit (HOSPITAL_BASED_OUTPATIENT_CLINIC_OR_DEPARTMENT_OTHER): Payer: Medicare Other

## 2017-03-15 ENCOUNTER — Ambulatory Visit (HOSPITAL_BASED_OUTPATIENT_CLINIC_OR_DEPARTMENT_OTHER): Payer: Medicare Other | Admitting: Internal Medicine

## 2017-03-15 ENCOUNTER — Other Ambulatory Visit (HOSPITAL_COMMUNITY)
Admission: RE | Admit: 2017-03-15 | Discharge: 2017-03-15 | Disposition: A | Discharge status: Home / Self Care | Payer: Medicare Other | Source: Ambulatory Visit | Attending: Internal Medicine | Admitting: Internal Medicine

## 2017-03-15 ENCOUNTER — Other Ambulatory Visit (HOSPITAL_BASED_OUTPATIENT_CLINIC_OR_DEPARTMENT_OTHER): Payer: Medicare Other

## 2017-03-15 DIAGNOSIS — Z5112 Encounter for antineoplastic immunotherapy: Secondary | ICD-10-CM

## 2017-03-15 DIAGNOSIS — C3431 Malignant neoplasm of lower lobe, right bronchus or lung: Secondary | ICD-10-CM | POA: Insufficient documentation

## 2017-03-15 DIAGNOSIS — G893 Neoplasm related pain (acute) (chronic): Secondary | ICD-10-CM

## 2017-03-15 DIAGNOSIS — R52 Pain, unspecified: Secondary | ICD-10-CM

## 2017-03-15 DIAGNOSIS — C3490 Malignant neoplasm of unspecified part of unspecified bronchus or lung: Secondary | ICD-10-CM

## 2017-03-15 LAB — COMPREHENSIVE METABOLIC PANEL
ALBUMIN: 4 g/dL (ref 3.5–5.0)
ALK PHOS: 77 U/L (ref 38–126)
ALT: 15 U/L — ABNORMAL LOW (ref 17–63)
AST: 25 U/L (ref 15–41)
Anion gap: 10 (ref 5–15)
BILIRUBIN TOTAL: 1.1 mg/dL (ref 0.3–1.2)
BUN: 21 mg/dL — AB (ref 6–20)
CALCIUM: 9.2 mg/dL (ref 8.9–10.3)
CO2: 26 mmol/L (ref 22–32)
Chloride: 98 mmol/L — ABNORMAL LOW (ref 101–111)
Creatinine, Ser: 0.82 mg/dL (ref 0.61–1.24)
GFR calc Af Amer: >60 mL/min (ref >60)
GFR calc non Af Amer: >60 mL/min (ref >60)
GLUCOSE: 83 mg/dL (ref 65–99)
Potassium: 3.6 mmol/L (ref 3.5–5.1)
Sodium: 134 mmol/L — ABNORMAL LOW (ref 135–145)
TOTAL PROTEIN: 6.6 g/dL (ref 6.5–8.1)

## 2017-03-15 LAB — CBC WITH DIFFERENTIAL/PLATELET
BASO%: 0.8 % (ref 0.0–2.0)
BASOS ABS: 0 10*3/uL (ref 0.0–0.1)
EOS%: 6.4 % (ref 0.0–7.0)
Eosinophils Absolute: 0.2 10*3/uL (ref 0.0–0.5)
HEMATOCRIT: 44.1 % (ref 38.4–49.9)
HEMOGLOBIN: 15.2 g/dL (ref 13.0–17.1)
LYMPH#: 0.9 10*3/uL (ref 0.9–3.3)
LYMPH%: 24.7 % (ref 14.0–49.0)
MCH: 33.4 pg (ref 27.2–33.4)
MCHC: 34.5 g/dL (ref 32.0–36.0)
MCV: 96.8 fL (ref 79.3–98.0)
MONO#: 0.5 10*3/uL (ref 0.1–0.9)
MONO%: 12.1 % (ref 0.0–14.0)
NEUT#: 2.1 10*3/uL (ref 1.5–6.5)
NEUT%: 56 % (ref 39.0–75.0)
Platelets: 207 10*3/uL (ref 140–400)
RBC: 4.55 10*6/uL (ref 4.20–5.82)
RDW: 13.5 % (ref 11.0–14.6)
WBC: 3.8 10*3/uL — ABNORMAL LOW (ref 4.0–10.3)

## 2017-03-15 MED ORDER — SODIUM CHLORIDE 0.9 % IV SOLN
Freq: Once | INTRAVENOUS | Status: AC
  Administered 2017-03-15: 14:00:00 via INTRAVENOUS

## 2017-03-15 MED ORDER — HYDROCODONE-ACETAMINOPHEN 5-325 MG PO TABS: 1.0000 | ORAL_TABLET | ORAL | 0 refills | Status: DC | PRN

## 2017-03-15 MED ORDER — SODIUM CHLORIDE 0.9 % IV SOLN
240.0000 mg | Freq: Once | INTRAVENOUS | Status: AC
  Administered 2017-03-15: 240 mg via INTRAVENOUS
  Filled 2017-03-15: qty 24

## 2017-03-15 NOTE — Progress Notes (Signed)
[name] [phone]   Fax:([phone]) [place], [place] [address]  DIAGNOSIS: Stage IV (T1a, N0, M1b) non-small cell lung cancer, adenocarcinoma of the right lower lobe diagnosed in October 2014  Molecular profile: Negative forRET, ALK, BRAF, MET, EGFR.  Positive for ERBB2 A775T, [id number], KRAS G13E, MAP2K1 D67N (see full report).   PRIOR THERAPY:  1) Systemic chemotherapy with carboplatin for AUC of 5 and Alimta 500 mg/M2 every 3 weeks, status post 6 cycles with stable disease. First dose on 10/23/2013. 2) Maintenance systemic chemotherapy with single agent Alimta 500 mg/M2 every 3 weeks. Status post 8 cycles.  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 Sondra Come completed on 12/16/2014. 4) stereotactic radiotherapy to the enlarging right upper lobe lung nodule under the care of Dr. Sondra Come completed on 11/22/2016  CURRENT THERAPY: Immunotherapy with Nivolumab 240 mg IV every 2 weeks, status post 51 cycles. First cycle was given on 01/28/2015.  INTERVAL HISTORY: Anthony Curry. 76 y.o. male returns to the clinic today for follow-up visit accompanied by his wife. The patient is currently undergoing treatment with immunotherapy with Nivolumab status post 51 cycles and has been tolerating his treatment fairly well. He mentions that he has some left lower back pain after the last cycle of his treatment but now completely resolved. He is requesting refill of his pain medication in case he needs it. He denied having any chest pain, shortness of breath, cough or hemoptysis. He has no fever or chills. The patient denied having any significant weight loss or night sweats. He is here today for evaluation before starting cycle #52.   MEDICAL HISTORY: Past Medical History:  Diagnosis Date  . 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[place])   . Adrenal mass ([place])     . Alcoholism in remission ([place])   . Anemia of chronic disease   . 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 Anxiety   . BPH (benign prostatic hyperplasia)   . Cancer ([place])    small cell/lung  . Complication of anesthesia 09/08/2015    JITTERY AFTER GALLBLADDER SURGERY  . GERD (gastroesophageal reflux disease)   . History of radiation therapy 11/14/16, 11/18/16, 11/22/16   Left upper lung: 54 Gy in 3 fractions  . Hypertension   . Hypertension 12/07/2016  . Neoplastic malignant related fatigue    IV tx. every 2 weeks last9-14-16 (Dr. Earlie Server), radiation last tx. 6 months  . 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 Persistent atrial fibrillation ([place])   . Pulmonary nodules   . Radiation 12/02/14-12/16/14   Left adrenal gland 30 Gy in 10 fractions    ALLERGIES:  has No Known Allergies.  MEDICATIONS:  Current Outpatient Prescriptions  Medication Sig Dispense Refill  . ALPRAZolam (XANAX) 0.25 MG tablet Take 1 tablet (0.25 mg total) by mouth 3 (three) times daily as needed for anxiety or sleep. And also at night for sleep. 90 tablet 0  . apixaban (ELIQUIS) 5 MG TABS tablet Take 5 mg by mouth 2 (two) times daily.    . Ascorbic Acid (VITAMIN C PO) Take 1,000 mg by mouth daily.     . B Complex-C (SUPER B COMPLEX PO) Take 1 each by mouth daily.     . calcium carbonate (OS-CAL) 600 MG TABS Take 600 mg by mouth daily with breakfast.    . demeclocycline (DECLOMYCIN) 150 MG tablet Take  150 mg by mouth 2 (two) times daily.    [name] diltiazem (CARDIZEM CD) 180 MG 24 hr capsule Take 1 capsule (180 mg total) by mouth daily at 12 noon. 90 capsule 3  . furosemide (LASIX) 40 MG tablet Take 0.5 tablets (20 mg total) by mouth every other day.    [name] HYDROcodone-acetaminophen (NORCO/VICODIN) 5-325 MG tablet Take 1-2 tablets by mouth every 4 (four) hours as needed for moderate pain. 30 tablet 0  . KLOR-CON M20 20 MEQ tablet Take 20 mEq by mouth every morning.     . Lactobacillus (ACIDOPHILUS)  CAPS capsule Take 1 capsule by mouth daily.    [name] MAGNESIUM PO Take 400 mg by mouth daily.     . Multiple Vitamin (MULTIVITAMIN WITH MINERALS) TABS Take 1 tablet by mouth daily.    . Nivolumab (OPDIVO IV) Inject as directed every 2 weeks    . Nutritional Supplements (ENSURE COMPLETE PO) Take 1 Can by mouth 2 (two) times daily.    . Omega-3 Fatty Acids (FISH OIL PO) Take 1 tablet by mouth daily.    . pantoprazole (PROTONIX) 40 MG tablet Take 40 mg by mouth every morning.  8  . tamsulosin (FLOMAX) 0.4 MG CAPS capsule Take 0.4 mg by mouth daily at 12 noon.     . thiamine (VITAMIN B-1) 100 MG tablet Take 1 tablet (100 mg total) by mouth daily. 30 tablet 6  . vitamin E 400 UNIT capsule Take 400 Units by mouth daily.     No current facility-administered medications for this visit.    Facility-Administered Medications Ordered in Other Visits  Medication Dose Route Frequency Provider Last Rate Last Dose  . 0.9 %  sodium chloride infusion   Intravenous Once [name], MD        SURGICAL HISTORY:  Past Surgical History:  Procedure Laterality Date  . ANKLE ARTHRODESIS  1995   rt fx-hardware in  . CARDIOVERSION N/A 09/29/2014   Procedure: CARDIOVERSION;  Surgeon: Josue Hector, MD;  Location: [facility] ENDOSCOPY;  Service: Cardiovascular;  Laterality: N/A;  . CHOLECYSTECTOMY  09/08/2015    laproscopic   . CHOLECYSTECTOMY N/A 09/08/2015   Procedure: LAPAROSCOPIC CHOLECYSTECTOMY WITH ATTEMPTED INTRAOPERATIVE CHOLANGIOGRAM ;  Surgeon: Fanny Skates, MD;  Location: [facility];  Service: General;  Laterality: N/A;  . COLONOSCOPY    . ERCP N/A 09/02/2015   Procedure: ENDOSCOPIC RETROGRADE CHOLANGIOPANCREATOGRAPHY (ERCP);  Surgeon: Arta Silence, MD;  Location: [facility];  Service: Endoscopy;  Laterality: N/A;  . ERCP N/A 09/07/2015   Procedure: ENDOSCOPIC RETROGRADE CHOLANGIOPANCREATOGRAPHY (ERCP);  Surgeon: Clarene Essex, MD;  Location: [facility];  Service: Endoscopy;  Laterality: N/A;  . EXCISION /  CURETTAGE BONE CYST FINGER  2005   rt thumb  . HEMORRHOID SURGERY    . Percutaneous Cholecytostomy tube     IVR insertion 06-13-15(Dr. Vernard Gambles)- 08-28-15 remains in place to drainage bag  . TONSILLECTOMY    . TOTAL SHOULDER ARTHROPLASTY  02/03/2012   Procedure: TOTAL SHOULDER ARTHROPLASTY;  Surgeon: [name], MD;  Location: [facility];  Service: Orthopedics;  Laterality: Left;    REVIEW OF SYSTEMS:  A comprehensive review of systems was negative except for: Constitutional: positive for fatigue Musculoskeletal: positive for myalgias   PHYSICAL EXAMINATION: General appearance: alert, cooperative and no distress Head: Normocephalic, without obvious abnormality, atraumatic Neck: no adenopathy, no JVD, supple, symmetrical, trachea midline and thyroid not enlarged, symmetric, no tenderness/mass/nodules Lymph nodes: Cervical, supraclavicular, and axillary nodes normal. Resp: clear to auscultation bilaterally  Back: symmetric, no curvature. ROM normal. No CVA tenderness. Cardio: regular rate and rhythm, S1, S2 normal, no murmur, click, rub or gallop GI: soft, non-tender; bowel sounds normal; no masses,  no organomegaly Extremities: extremities normal, atraumatic, no cyanosis or edema  ECOG PERFORMANCE STATUS: 1 - Symptomatic but completely ambulatory  Blood pressure (!) 152/68, pulse 66, temperature 97.7 F (36.5 C), temperature source Oral, resp. rate 18, height _0  (1.803 m), weight 154 lb 1.6 oz (69.9 kg), SpO2 100 %.  LABORATORY DATA: Lab Results  Component Value Date   WBC 4.5 03/01/2017   HGB 14.8 03/01/2017   HCT 43.0 03/01/2017   MCV 97.2 03/01/2017   PLT 225 03/01/2017      Chemistry      Component Value Date/Time   NA 134 (L) 03/01/2017 1137   K 4.4 03/01/2017 1137   CL 101 09/14/2016 1319   CO2 24 03/01/2017 1137   BUN 15.9 03/01/2017 1137   CREATININE 0.8 03/01/2017 1137      Component Value Date/Time   CALCIUM 9.8 03/01/2017 1137   ALKPHOS 98  03/01/2017 1137   AST 23 03/01/2017 1137   ALT 17 03/01/2017 1137   BILITOT 1.01 03/01/2017 1137       RADIOGRAPHIC STUDIES: Ct Chest W Contrast  Result Date: 02/27/2017 CLINICAL DATA:  Lung cancer. EXAM: CT CHEST, ABDOMEN, AND PELVIS WITH CONTRAST TECHNIQUE: Multidetector CT imaging of the chest, abdomen and pelvis was performed following the standard protocol during bolus administration of intravenous contrast. CONTRAST:  14m ISOVUE-300 IOPAMIDOL (ISOVUE-300) INJECTION 61% COMPARISON:  01/02/2017 FINDINGS: CT CHEST FINDINGS Cardiovascular: Heart size normal. Trace pericardial effusion is stable. Coronary artery calcification is noted. Atherosclerotic calcification is noted in the wall of the thoracic aorta. Mediastinum/Nodes: No mediastinal lymphadenopathy. There is no hilar lymphadenopathy. The esophagus has normal imaging features. There is no axillary lymphadenopathy. Lungs/Pleura: Previously seen 12 x 16 mm medial left upper lobe lesion now measures 15 x 18 mm. There is new surrounding airspace disease in the medial left apex, likely radiation related. Changes of emphysema persist. Multiple scattered areas of architectural distortion/ground-glass attenuation in both lungs show no substantial interval change. Index lesion left upper lobe is 17 x 22 mm today compared to 17 x 28 mm previously. Index right upper lobe lesion is 32 x 21 mm today compared to 28 x 22 mm previously. Musculoskeletal: Multiple nonacute posterior right rib fractures are evident. Patient is status post left shoulder replacement. Advanced degenerative changes right shoulder. Advanced degenerative changes lower thoracic spine. CT ABDOMEN PELVIS FINDINGS Hepatobiliary: Hypoattenuating lesions inferior right liver are stable. The tiny hypervascular foci identified in both the right and left lobes of liver are unchanged. Gallbladder surgically absent. No intrahepatic or extrahepatic biliary dilation. Pancreas: No focal mass lesion.  No dilatation of the main duct. No intraparenchymal cyst. No peripancreatic edema. Spleen: No splenomegaly. No focal mass lesion. Adrenals/Urinary Tract: No adrenal nodule or mass. Cortical scarring noted both kidneys without suspicious or worrisome enhancing lesion. No evidence for hydroureter. Bladder is moderately distended with wall irregularity/ trabeculation and apparent bilateral diverticuli. Stomach/Bowel: Stomach is nondistended. No gastric wall thickening. No evidence of outlet obstruction. Duodenum is normally positioned as is the ligament of Treitz. No small bowel wall thickening. No small bowel dilatation. The terminal ileum is normal. The appendix is normal. No gross colonic mass. No colonic wall thickening. No substantial diverticular change. Vascular/Lymphatic: There is abdominal aortic atherosclerosis without aneurysm. There is no gastrohepatic or hepatoduodenal ligament lymphadenopathy. No  intraperitoneal or retroperitoneal lymphadenopathy. No pelvic sidewall lymphadenopathy. Reproductive: The prostate gland and seminal vesicles have normal imaging features. Other: No intraperitoneal free fluid. Musculoskeletal: 14 mm lucent lesion in the right iliac bone is stable. Advanced degenerative changes noted lumbar spine. IMPRESSION: 1. Left upper lobe lesion is stable to minimally increased in the interval with development of surrounding confluent airspace disease. These changes are likely all related to treatment/radiation and continued attention on follow-up recommended. 2. No substantial change in the areas of ground-glass attenuation/architectural distortion in both lungs. Continued attention on follow-up recommended. 3.  Abdominal Aortic Atherosclerois (ICD10-170.0) 4.  Emphysema. (ICD10-J43.9) 5. Changes in the bladder wall suggest bladder outlet obstruction. Electronically Signed   By: Misty Stanley M.D.   On: 02/27/2017 16:31   Ct Abdomen Pelvis W Contrast  Result Date: 02/27/2017 CLINICAL  DATA:  Lung cancer. EXAM: CT CHEST, ABDOMEN, AND PELVIS WITH CONTRAST TECHNIQUE: Multidetector CT imaging of the chest, abdomen and pelvis was performed following the standard protocol during bolus administration of intravenous contrast. CONTRAST:  17m ISOVUE-300 IOPAMIDOL (ISOVUE-300) INJECTION 61% COMPARISON:  01/02/2017 FINDINGS: CT CHEST FINDINGS Cardiovascular: Heart size normal. Trace pericardial effusion is stable. Coronary artery calcification is noted. Atherosclerotic calcification is noted in the wall of the thoracic aorta. Mediastinum/Nodes: No mediastinal lymphadenopathy. There is no hilar lymphadenopathy. The esophagus has normal imaging features. There is no axillary lymphadenopathy. Lungs/Pleura: Previously seen 12 x 16 mm medial left upper lobe lesion now measures 15 x 18 mm. There is new surrounding airspace disease in the medial left apex, likely radiation related. Changes of emphysema persist. Multiple scattered areas of architectural distortion/ground-glass attenuation in both lungs show no substantial interval change. Index lesion left upper lobe is 17 x 22 mm today compared to 17 x 28 mm previously. Index right upper lobe lesion is 32 x 21 mm today compared to 28 x 22 mm previously. Musculoskeletal: Multiple nonacute posterior right rib fractures are evident. Patient is status post left shoulder replacement. Advanced degenerative changes right shoulder. Advanced degenerative changes lower thoracic spine. CT ABDOMEN PELVIS FINDINGS Hepatobiliary: Hypoattenuating lesions inferior right liver are stable. The tiny hypervascular foci identified in both the right and left lobes of liver are unchanged. Gallbladder surgically absent. No intrahepatic or extrahepatic biliary dilation. Pancreas: No focal mass lesion. No dilatation of the main duct. No intraparenchymal cyst. No peripancreatic edema. Spleen: No splenomegaly. No focal mass lesion. Adrenals/Urinary Tract: No adrenal nodule or mass. Cortical  scarring noted both kidneys without suspicious or worrisome enhancing lesion. No evidence for hydroureter. Bladder is moderately distended with wall irregularity/ trabeculation and apparent bilateral diverticuli. Stomach/Bowel: Stomach is nondistended. No gastric wall thickening. No evidence of outlet obstruction. Duodenum is normally positioned as is the ligament of Treitz. No small bowel wall thickening. No small bowel dilatation. The terminal ileum is normal. The appendix is normal. No gross colonic mass. No colonic wall thickening. No substantial diverticular change. Vascular/Lymphatic: There is abdominal aortic atherosclerosis without aneurysm. There is no gastrohepatic or hepatoduodenal ligament lymphadenopathy. No intraperitoneal or retroperitoneal lymphadenopathy. No pelvic sidewall lymphadenopathy. Reproductive: The prostate gland and seminal vesicles have normal imaging features. Other: No intraperitoneal free fluid. Musculoskeletal: 14 mm lucent lesion in the right iliac bone is stable. Advanced degenerative changes noted lumbar spine. IMPRESSION: 1. Left upper lobe lesion is stable to minimally increased in the interval with development of surrounding confluent airspace disease. These changes are likely all related to treatment/radiation and continued attention on follow-up recommended. 2. No substantial change in the  areas of ground-glass attenuation/architectural distortion in both lungs. Continued attention on follow-up recommended. 3.  Abdominal Aortic Atherosclerois (ICD10-170.0) 4.  Emphysema. (ICD10-J43.9) 5. Changes in the bladder wall suggest bladder outlet obstruction. Electronically Signed   By: Misty Stanley M.D.   On: 02/27/2017 16:31    ASSESSMENT AND PLAN:  This is a very pleasant 76 years old white male with metastatic non-small cell lung cancer, adenocarcinoma status post systemic chemotherapy with carboplatin and Alimta followed by maintenance Alimta for many cycles discontinued  secondary to disease progression. The patient was started on treatment with immunotherapy with Nivolumab status post 51 cycles and has been tolerating this treatment fairly well. I recommended for him to proceed with cycle #52 today. For pain management I gave him a refill of Vicodin. He would come back for follow-up visit in 2 weeks for evaluation with the start of cycle #53. The patient was advised to call immediately if he has any concerning symptoms in the interval. The patient voices understanding of current disease status and treatment options and is in agreement with the current care plan. All questions were answered. The patient knows to call the clinic with any problems, questions or concerns. We can certainly see the patient much sooner if necessary. I spent 10 minutes counseling the patient face to face. The total time spent in the appointment was 15 minutes. Disclaimer: This note was dictated with voice recognition software. Similar sounding words can inadvertently be transcribed and may not be corrected upon review.

## 2017-03-15 NOTE — Patient Instructions (Signed)
[facility] Discharge Instructions for Patients Receiving Chemotherapy  Today you received the following chemotherapy agents Opdivo.  To help prevent nausea and vomiting after your treatment, we encourage you to take your nausea medication as directed.   If you develop nausea and vomiting that is not controlled by your nausea medication, call the clinic.   BELOW ARE SYMPTOMS THAT SHOULD BE REPORTED IMMEDIATELY:  *FEVER GREATER THAN 100.5 F  *CHILLS WITH OR WITHOUT FEVER  NAUSEA AND VOMITING THAT IS NOT CONTROLLED WITH YOUR NAUSEA MEDICATION  *UNUSUAL SHORTNESS OF BREATH  *UNUSUAL BRUISING OR BLEEDING  TENDERNESS IN MOUTH AND THROAT WITH OR WITHOUT PRESENCE OF ULCERS  *URINARY PROBLEMS  *BOWEL PROBLEMS  UNUSUAL RASH Items with * indicate a potential emergency and should be followed up as soon as possible.  Feel free to call the clinic you have any questions or concerns. The clinic phone number is [phone].  Please show the CHEMO ALERT CARD at check-in to the Emergency Department and triage nurse.    

## 2017-03-16 ENCOUNTER — Telehealth: Payer: Self-pay | Admitting: Internal Medicine

## 2017-03-16 NOTE — Telephone Encounter (Signed)
Scheduled appts per 4/11 los. - patient to pick up new schedule, next visit.

## 2017-03-23 ENCOUNTER — Ambulatory Visit: Payer: Self-pay | Admitting: Radiation Oncology

## 2017-03-28 ENCOUNTER — Ambulatory Visit
Admission: RE | Admit: 2017-03-28 | Discharge: 2017-03-28 | Disposition: A | Discharge status: Home / Self Care | Payer: Medicare Other | Source: Ambulatory Visit | Attending: Radiation Oncology | Admitting: Radiation Oncology

## 2017-03-28 ENCOUNTER — Encounter: Payer: Self-pay | Admitting: Radiation Oncology

## 2017-03-28 DIAGNOSIS — C3431 Malignant neoplasm of lower lobe, right bronchus or lung: Secondary | ICD-10-CM | POA: Diagnosis not present

## 2017-03-28 DIAGNOSIS — Z7901 Long term (current) use of anticoagulants: Secondary | ICD-10-CM | POA: Diagnosis not present

## 2017-03-28 DIAGNOSIS — C3412 Malignant neoplasm of upper lobe, left bronchus or lung: Secondary | ICD-10-CM | POA: Diagnosis not present

## 2017-03-28 DIAGNOSIS — Z79899 Other long term (current) drug therapy: Secondary | ICD-10-CM | POA: Diagnosis not present

## 2017-03-28 DIAGNOSIS — Z08 Encounter for follow-up examination after completed treatment for malignant neoplasm: Secondary | ICD-10-CM | POA: Diagnosis not present

## 2017-03-28 NOTE — Progress Notes (Signed)
Anthony Curry is here for follow up after SBRT to his left upper lung.  He denies having pain, shortness of breath or a cough.  He reports having to clear his throat in the morning and has yellow sputum.  He reports having fatigue.  He will have chemotherapy tomorrow.  He is using a walker because of neuropathy in his feet.  BP (!) 172/76 (BP Location: Left Arm, Patient Position: Sitting)   Pulse 66   Temp 98 F (36.7 C) (Oral)   Ht '5\' 11"'$  (1.803 m)   Wt 157 lb 9.6 oz (71.5 kg)   SpO2 98%   BMI 21.98 kg/m    Wt Readings from Last 3 Encounters:  03/28/17 157 lb 9.6 oz (71.5 kg)  03/15/17 154 lb 1.6 oz (69.9 kg)  03/01/17 155 lb 1.6 oz (70.4 kg)

## 2017-03-28 NOTE — Progress Notes (Signed)
Radiation Oncology         [phone]) [phone] ________________________________  Name: Anthony Curry. MRN: [id number]  Date: 03/28/2017  DOB: [birth year]  Follow-Up Visit Note  CC: [name], MD  [name], MD    ICD-9-CM ICD-10-CM   1. Malignant neoplasm of lower lobe of right lung ([place]) 162.5 C34.31   2. 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  He continues on Immunotherapy with Nivolumab 240 mg IV every 2 weeks, status post 51 cycles. First cycle was given on 01/28/2015.  He underwent repeat imaging in late March showing the lesion in the left upper lobe to be less dense with radiation changes surrounding. No new problems on repeat imaging                         ALLERGIES:  has No Known Allergies.  Meds: Current Outpatient Prescriptions  Medication Sig Dispense Refill  . ALPRAZolam (XANAX) 0.25 MG tablet Take 1 tablet (0.25 mg total) by mouth 3 (three) times daily as needed for anxiety or sleep. And also at night for sleep. 90 tablet 0  . apixaban (ELIQUIS) 5 MG TABS tablet Take 5 mg by mouth 2 (two) times daily.    . Ascorbic Acid (VITAMIN C PO) Take 1,000 mg by mouth daily.     . B Complex-C (SUPER B COMPLEX PO) Take 1 each by mouth daily.     . calcium carbonate (OS-CAL) 600 MG TABS Take 600 mg by mouth daily with breakfast.    . demeclocycline (DECLOMYCIN) 150 MG tablet Take 150 mg by mouth 2 (two) times daily.    [name] diltiazem (CARDIZEM CD) 180 MG 24 hr capsule Take 1 capsule (180 mg total) by mouth daily at 12 noon. 90 capsule 3  . furosemide (LASIX) 40 MG tablet Take 0.5 tablets (20 mg total) by mouth every other day.    [name] HYDROcodone-acetaminophen (NORCO/VICODIN) 5-325 MG tablet  Take 1-2 tablets by mouth every 4 (four) hours as needed for moderate pain. 30 tablet 0  . KLOR-CON M20 20 MEQ tablet Take 20 mEq by mouth every morning.     . Lactobacillus (ACIDOPHILUS) CAPS capsule Take 1 capsule by mouth daily.    [name] MAGNESIUM PO Take 400 mg by mouth daily.     . Multiple Vitamin (MULTIVITAMIN WITH MINERALS) TABS Take 1 tablet by mouth daily.    . Nivolumab (OPDIVO IV) Inject as directed every 2 weeks    . Nutritional Supplements (ENSURE COMPLETE PO) Take 1 Can by mouth 2 (two) times daily.    . Omega-3 Fatty Acids (FISH OIL PO) Take 1 tablet by mouth daily.    . tamsulosin (FLOMAX) 0.4 MG CAPS capsule Take 0.4 mg by mouth daily at 12 noon.     . thiamine (VITAMIN B-1) 100 MG tablet Take 1 tablet (100 mg total) by mouth daily. 30 tablet 6  . vitamin E 400 UNIT capsule Take 400 Units by mouth daily.    . pantoprazole (PROTONIX) 40 MG tablet Take 40 mg by mouth every morning.  8   No current facility-administered medications for this encounter.    Facility-Administered Medications Ordered in  Other Encounters  Medication Dose Route Frequency Provider Last Rate Last Dose  . 0.9 %  sodium chloride infusion   Intravenous Once [name], MD        Physical Findings: The patient is in no acute distress. Patient is alert and oriented.  height is '5\' 11"'$  (1.803 m) and weight is 157 lb 9.6 oz (71.5 kg). His oral temperature is 98 F (36.7 C). His blood pressure is 172/76 (abnormal) and his pulse is 66. His oxygen saturation is 98%. .  No palpable supraclavicular or axillary adenopathy. The lungs are clear to auscultation. The heart has a regular rhythm and rate  Lab Findings: Lab Results  Component Value Date   WBC 3.8 (L) 03/15/2017   HGB 15.2 03/15/2017   HCT 44.1 03/15/2017   MCV 96.8 03/15/2017   PLT 207 03/15/2017    Radiographic Findings: Ct Chest W Contrast  Result Date: 02/27/2017 CLINICAL DATA:  Lung cancer. EXAM: CT CHEST, ABDOMEN, AND PELVIS WITH  CONTRAST TECHNIQUE: Multidetector CT imaging of the chest, abdomen and pelvis was performed following the standard protocol during bolus administration of intravenous contrast. CONTRAST:  189m ISOVUE-300 IOPAMIDOL (ISOVUE-300) INJECTION 61% COMPARISON:  01/02/2017 FINDINGS: CT CHEST FINDINGS Cardiovascular: Heart size normal. Trace pericardial effusion is stable. Coronary artery calcification is noted. Atherosclerotic calcification is noted in the wall of the thoracic aorta. Mediastinum/Nodes: No mediastinal lymphadenopathy. There is no hilar lymphadenopathy. The esophagus has normal imaging features. There is no axillary lymphadenopathy. Lungs/Pleura: Previously seen 12 x 16 mm medial left upper lobe lesion now measures 15 x 18 mm. There is new surrounding airspace disease in the medial left apex, likely radiation related. Changes of emphysema persist. Multiple scattered areas of architectural distortion/ground-glass attenuation in both lungs show no substantial interval change. Index lesion left upper lobe is 17 x 22 mm today compared to 17 x 28 mm previously. Index right upper lobe lesion is 32 x 21 mm today compared to 28 x 22 mm previously. Musculoskeletal: Multiple nonacute posterior right rib fractures are evident. Patient is status post left shoulder replacement. Advanced degenerative changes right shoulder. Advanced degenerative changes lower thoracic spine. CT ABDOMEN PELVIS FINDINGS Hepatobiliary: Hypoattenuating lesions inferior right liver are stable. The tiny hypervascular foci identified in both the right and left lobes of liver are unchanged. Gallbladder surgically absent. No intrahepatic or extrahepatic biliary dilation. Pancreas: No focal mass lesion. No dilatation of the main duct. No intraparenchymal cyst. No peripancreatic edema. Spleen: No splenomegaly. No focal mass lesion. Adrenals/Urinary Tract: No adrenal nodule or mass. Cortical scarring noted both kidneys without suspicious or worrisome  enhancing lesion. No evidence for hydroureter. Bladder is moderately distended with wall irregularity/ trabeculation and apparent bilateral diverticuli. Stomach/Bowel: Stomach is nondistended. No gastric wall thickening. No evidence of outlet obstruction. Duodenum is normally positioned as is the ligament of Treitz. No small bowel wall thickening. No small bowel dilatation. The terminal ileum is normal. The appendix is normal. No gross colonic mass. No colonic wall thickening. No substantial diverticular change. Vascular/Lymphatic: There is abdominal aortic atherosclerosis without aneurysm. There is no gastrohepatic or hepatoduodenal ligament lymphadenopathy. No intraperitoneal or retroperitoneal lymphadenopathy. No pelvic sidewall lymphadenopathy. Reproductive: The prostate gland and seminal vesicles have normal imaging features. Other: No intraperitoneal free fluid. Musculoskeletal: 14 mm lucent lesion in the right iliac bone is stable. Advanced degenerative changes noted lumbar spine. IMPRESSION: 1. Left upper lobe lesion is stable to minimally increased in the interval with development of surrounding confluent airspace disease. These changes are  likely all related to treatment/radiation and continued attention on follow-up recommended. 2. No substantial change in the areas of ground-glass attenuation/architectural distortion in both lungs. Continued attention on follow-up recommended. 3.  Abdominal Aortic Atherosclerois (ICD10-170.0) 4.  Emphysema. (ICD10-J43.9) 5. Changes in the bladder wall suggest bladder outlet obstruction. Electronically Signed   By: Misty Stanley M.D.   On: 02/27/2017 16:31   Ct Abdomen Pelvis W Contrast  Result Date: 02/27/2017 CLINICAL DATA:  Lung cancer. EXAM: CT CHEST, ABDOMEN, AND PELVIS WITH CONTRAST TECHNIQUE: Multidetector CT imaging of the chest, abdomen and pelvis was performed following the standard protocol during bolus administration of intravenous contrast. CONTRAST:   113m ISOVUE-300 IOPAMIDOL (ISOVUE-300) INJECTION 61% COMPARISON:  01/02/2017 FINDINGS: CT CHEST FINDINGS Cardiovascular: Heart size normal. Trace pericardial effusion is stable. Coronary artery calcification is noted. Atherosclerotic calcification is noted in the wall of the thoracic aorta. Mediastinum/Nodes: No mediastinal lymphadenopathy. There is no hilar lymphadenopathy. The esophagus has normal imaging features. There is no axillary lymphadenopathy. Lungs/Pleura: Previously seen 12 x 16 mm medial left upper lobe lesion now measures 15 x 18 mm. There is new surrounding airspace disease in the medial left apex, likely radiation related. Changes of emphysema persist. Multiple scattered areas of architectural distortion/ground-glass attenuation in both lungs show no substantial interval change. Index lesion left upper lobe is 17 x 22 mm today compared to 17 x 28 mm previously. Index right upper lobe lesion is 32 x 21 mm today compared to 28 x 22 mm previously. Musculoskeletal: Multiple nonacute posterior right rib fractures are evident. Patient is status post left shoulder replacement. Advanced degenerative changes right shoulder. Advanced degenerative changes lower thoracic spine. CT ABDOMEN PELVIS FINDINGS Hepatobiliary: Hypoattenuating lesions inferior right liver are stable. The tiny hypervascular foci identified in both the right and left lobes of liver are unchanged. Gallbladder surgically absent. No intrahepatic or extrahepatic biliary dilation. Pancreas: No focal mass lesion. No dilatation of the main duct. No intraparenchymal cyst. No peripancreatic edema. Spleen: No splenomegaly. No focal mass lesion. Adrenals/Urinary Tract: No adrenal nodule or mass. Cortical scarring noted both kidneys without suspicious or worrisome enhancing lesion. No evidence for hydroureter. Bladder is moderately distended with wall irregularity/ trabeculation and apparent bilateral diverticuli. Stomach/Bowel: Stomach is  nondistended. No gastric wall thickening. No evidence of outlet obstruction. Duodenum is normally positioned as is the ligament of Treitz. No small bowel wall thickening. No small bowel dilatation. The terminal ileum is normal. The appendix is normal. No gross colonic mass. No colonic wall thickening. No substantial diverticular change. Vascular/Lymphatic: There is abdominal aortic atherosclerosis without aneurysm. There is no gastrohepatic or hepatoduodenal ligament lymphadenopathy. No intraperitoneal or retroperitoneal lymphadenopathy. No pelvic sidewall lymphadenopathy. Reproductive: The prostate gland and seminal vesicles have normal imaging features. Other: No intraperitoneal free fluid. Musculoskeletal: 14 mm lucent lesion in the right iliac bone is stable. Advanced degenerative changes noted lumbar spine. IMPRESSION: 1. Left upper lobe lesion is stable to minimally increased in the interval with development of surrounding confluent airspace disease. These changes are likely all related to treatment/radiation and continued attention on follow-up recommended. 2. No substantial change in the areas of ground-glass attenuation/architectural distortion in both lungs. Continued attention on follow-up recommended. 3.  Abdominal Aortic Atherosclerois (ICD10-170.0) 4.  Emphysema. (ICD10-J43.9) 5. Changes in the bladder wall suggest bladder outlet obstruction. Electronically Signed   By: EMisty StanleyM.D.   On: 02/27/2017 16:31    Impression:  Clinically stable at this time. The patient will continue on immunotherapy  Plan:  When necessary follow-up in radiation oncology. The patient continues to follow-up closely with medical oncology as above.  ____________________________________ Gery Pray, MD

## 2017-03-29 ENCOUNTER — Ambulatory Visit (HOSPITAL_BASED_OUTPATIENT_CLINIC_OR_DEPARTMENT_OTHER): Payer: Medicare Other

## 2017-03-29 ENCOUNTER — Ambulatory Visit (HOSPITAL_BASED_OUTPATIENT_CLINIC_OR_DEPARTMENT_OTHER): Payer: Medicare Other | Admitting: Internal Medicine

## 2017-03-29 ENCOUNTER — Other Ambulatory Visit (HOSPITAL_BASED_OUTPATIENT_CLINIC_OR_DEPARTMENT_OTHER): Payer: Medicare Other

## 2017-03-29 ENCOUNTER — Encounter: Payer: Self-pay | Admitting: Internal Medicine

## 2017-03-29 VITALS — BP 140/67 | HR 71 | Temp 98.1°F | Resp 18 | Ht 71.0 in | Wt 155.1 lb

## 2017-03-29 DIAGNOSIS — Z5112 Encounter for antineoplastic immunotherapy: Secondary | ICD-10-CM

## 2017-03-29 DIAGNOSIS — C3431 Malignant neoplasm of lower lobe, right bronchus or lung: Secondary | ICD-10-CM

## 2017-03-29 LAB — CBC WITH DIFFERENTIAL/PLATELET
BASO%: 1 % (ref 0.0–2.0)
Basophils Absolute: 0 10*3/uL (ref 0.0–0.1)
EOS%: 6.1 % (ref 0.0–7.0)
Eosinophils Absolute: 0.2 10*3/uL (ref 0.0–0.5)
HEMATOCRIT: 43.6 % (ref 38.4–49.9)
HEMOGLOBIN: 15 g/dL (ref 13.0–17.1)
LYMPH#: 1 10*3/uL (ref 0.9–3.3)
LYMPH%: 23.6 % (ref 14.0–49.0)
MCH: 33.1 pg (ref 27.2–33.4)
MCHC: 34.4 g/dL (ref 32.0–36.0)
MCV: 96.2 fL (ref 79.3–98.0)
MONO#: 0.5 10*3/uL (ref 0.1–0.9)
MONO%: 12.3 % (ref 0.0–14.0)
NEUT%: 57 % (ref 39.0–75.0)
NEUTROS ABS: 2.3 10*3/uL (ref 1.5–6.5)
Platelets: 221 10*3/uL (ref 140–400)
RBC: 4.53 10*6/uL (ref 4.20–5.82)
RDW: 14 % (ref 11.0–14.6)
WBC: 4 10*3/uL (ref 4.0–10.3)

## 2017-03-29 LAB — COMPREHENSIVE METABOLIC PANEL
ALT: 15 U/L (ref 0–55)
AST: 22 U/L (ref 5–34)
Albumin: 4 g/dL (ref 3.5–5.0)
Alkaline Phosphatase: 101 U/L (ref 40–150)
Anion Gap: 11 mEq/L (ref 3–11)
BILIRUBIN TOTAL: 0.87 mg/dL (ref 0.20–1.20)
BUN: 14.4 mg/dL (ref 7.0–26.0)
CALCIUM: 9.8 mg/dL (ref 8.4–10.4)
CO2: 26 mEq/L (ref 22–29)
CREATININE: 0.9 mg/dL (ref 0.7–1.3)
Chloride: 96 mEq/L — ABNORMAL LOW (ref 98–109)
EGFR: 84 mL/min/{1.73_m2} — ABNORMAL LOW (ref >90)
Glucose: 117 mg/dl (ref 70–140)
Potassium: 4.5 mEq/L (ref 3.5–5.1)
Sodium: 132 mEq/L — ABNORMAL LOW (ref 136–145)
TOTAL PROTEIN: 6.4 g/dL (ref 6.4–8.3)

## 2017-03-29 MED ORDER — SODIUM CHLORIDE 0.9 % IV SOLN
Freq: Once | INTRAVENOUS | Status: AC
  Administered 2017-03-29: 13:00:00 via INTRAVENOUS

## 2017-03-29 MED ORDER — SODIUM CHLORIDE 0.9 % IV SOLN
240.0000 mg | Freq: Once | INTRAVENOUS | Status: AC
  Administered 2017-03-29: 240 mg via INTRAVENOUS
  Filled 2017-03-29: qty 24

## 2017-03-29 NOTE — Patient Instructions (Signed)
[facility] Discharge Instructions for Patients Receiving Chemotherapy  Today you received the following chemotherapy agents Opdivo.  To help prevent nausea and vomiting after your treatment, we encourage you to take your nausea medication as directed.   If you develop nausea and vomiting that is not controlled by your nausea medication, call the clinic.   BELOW ARE SYMPTOMS THAT SHOULD BE REPORTED IMMEDIATELY:  *FEVER GREATER THAN 100.5 F  *CHILLS WITH OR WITHOUT FEVER  NAUSEA AND VOMITING THAT IS NOT CONTROLLED WITH YOUR NAUSEA MEDICATION  *UNUSUAL SHORTNESS OF BREATH  *UNUSUAL BRUISING OR BLEEDING  TENDERNESS IN MOUTH AND THROAT WITH OR WITHOUT PRESENCE OF ULCERS  *URINARY PROBLEMS  *BOWEL PROBLEMS  UNUSUAL RASH Items with * indicate a potential emergency and should be followed up as soon as possible.  Feel free to call the clinic you have any questions or concerns. The clinic phone number is [phone].  Please show the CHEMO ALERT CARD at check-in to the Emergency Department and triage nurse.    

## 2017-03-29 NOTE — Progress Notes (Signed)
[place] [phone]   Fax:([phone]) [place], [place] [address]  DIAGNOSIS: Stage IV (T1a, N0, M1b) non-small cell lung cancer, adenocarcinoma of the right lower lobe diagnosed in October 2014  Molecular profile: Negative forRET, ALK, BRAF, MET, EGFR.  Positive for ERBB2 A775T, [id number], KRAS G13E, MAP2K1 D67N (see full report).   PRIOR THERAPY:  1) Systemic chemotherapy with carboplatin for AUC of 5 and Alimta 500 mg/M2 every 3 weeks, status post 6 cycles with stable disease. First dose on 10/23/2013. 2) Maintenance systemic chemotherapy with single agent Alimta 500 mg/M2 every 3 weeks. Status post 8 cycles.  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 Sondra Come completed on 12/16/2014. 4) stereotactic radiotherapy to the enlarging right upper lobe lung nodule under the care of Dr. Sondra Come completed on 11/22/2016  CURRENT THERAPY: Immunotherapy with Nivolumab 240 mg IV every 2 weeks, status post 52 cycles. First cycle was given on 01/28/2015.  INTERVAL HISTORY: Anthony Curry. 76 y.o. male returns to the clinic today for follow-up visit accompanied by his wife. The patient continues to tolerate his treatment with Nivolumab fairly well. He status post 52 cycles. He denied having any chest pain, shortness of breath, cough or hemoptysis. He has no fever or chills. He denied having any weight loss or night sweats. He has no headache or visual changes. He continues to have mild peripheral neuropathy in addition to occasional dizzy spells. Is less today for evaluation before starting cycle #53.   MEDICAL HISTORY: Past Medical History:  Diagnosis Date  . 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[place])   . Adrenal mass ([place])   . Alcoholism in remission ([place])   . Anemia of chronic disease   . 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 Anxiety     . BPH (benign prostatic hyperplasia)   . Cancer ([place])    small cell/lung  . Complication of anesthesia 09/08/2015    JITTERY AFTER GALLBLADDER SURGERY  . GERD (gastroesophageal reflux disease)   . History of radiation therapy 11/14/16, 11/18/16, 11/22/16   Left upper lung: 54 Gy in 3 fractions  . Hypertension   . Hypertension 12/07/2016  . Neoplastic malignant related fatigue    IV tx. every 2 weeks last9-14-16 (Dr. Earlie Server), radiation last tx. 6 months  . 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 Persistent atrial fibrillation ([place])   . Pulmonary nodules   . Radiation 12/02/14-12/16/14   Left adrenal gland 30 Gy in 10 fractions    ALLERGIES:  has No Known Allergies.  MEDICATIONS:  Current Outpatient Prescriptions  Medication Sig Dispense Refill  . ALPRAZolam (XANAX) 0.25 MG tablet Take 1 tablet (0.25 mg total) by mouth 3 (three) times daily as needed for anxiety or sleep. And also at night for sleep. 90 tablet 0  . apixaban (ELIQUIS) 5 MG TABS tablet Take 5 mg by mouth 2 (two) times daily.    . Ascorbic Acid (VITAMIN C PO) Take 1,000 mg by mouth daily.     . B Complex-C (SUPER B COMPLEX PO) Take 1 each by mouth daily.     . calcium carbonate (OS-CAL) 600 MG TABS Take 600 mg by mouth daily with breakfast.    . demeclocycline (DECLOMYCIN) 150 MG tablet Take 150 mg by mouth 2 (two) times daily.    [name] diltiazem (CARDIZEM CD) 180 MG 24 hr capsule Take 1 capsule (  180 mg total) by mouth daily at 12 noon. 90 capsule 3  . furosemide (LASIX) 40 MG tablet Take 0.5 tablets (20 mg total) by mouth every other day.    [name] HYDROcodone-acetaminophen (NORCO/VICODIN) 5-325 MG tablet Take 1-2 tablets by mouth every 4 (four) hours as needed for moderate pain. 30 tablet 0  . KLOR-CON M20 20 MEQ tablet Take 20 mEq by mouth every morning.     . Lactobacillus (ACIDOPHILUS) CAPS capsule Take 1 capsule by mouth daily.    [name] MAGNESIUM PO Take 400 mg by mouth daily.     . Multiple Vitamin (MULTIVITAMIN WITH  MINERALS) TABS Take 1 tablet by mouth daily.    . Nivolumab (OPDIVO IV) Inject as directed every 2 weeks    . Nutritional Supplements (ENSURE COMPLETE PO) Take 1 Can by mouth 2 (two) times daily.    . Omega-3 Fatty Acids (FISH OIL PO) Take 1 tablet by mouth daily.    . pantoprazole (PROTONIX) 40 MG tablet Take 40 mg by mouth every morning.  8  . tamsulosin (FLOMAX) 0.4 MG CAPS capsule Take 0.4 mg by mouth daily at 12 noon.     . thiamine (VITAMIN B-1) 100 MG tablet Take 1 tablet (100 mg total) by mouth daily. 30 tablet 6  . vitamin E 400 UNIT capsule Take 400 Units by mouth daily.     No current facility-administered medications for this visit.    Facility-Administered Medications Ordered in Other Visits  Medication Dose Route Frequency Provider Last Rate Last Dose  . 0.9 %  sodium chloride infusion   Intravenous Once [name], MD        SURGICAL HISTORY:  Past Surgical History:  Procedure Laterality Date  . ANKLE ARTHRODESIS  1995   rt fx-hardware in  . CARDIOVERSION N/A 09/29/2014   Procedure: CARDIOVERSION;  Surgeon: Josue Hector, MD;  Location[facility] ENDOSCOPY;  Service: Cardiovascular;  Laterality: N/A;  . CHOLECYSTECTOMY  09/08/2015    laproscopic   . CHOLECYSTECTOMY N/A 09/08/2015   Procedure: LAPAROSCOPIC CHOLECYSTECTOMY WITH ATTEMPTED INTRAOPERATIVE CHOLANGIOGRAM ;  Surgeon: Fanny Skates, MD;  Location: [facility];  Service: General;  Laterality: N/A;  . COLONOSCOPY    . ERCP N/A 09/02/2015   Procedure: ENDOSCOPIC RETROGRADE CHOLANGIOPANCREATOGRAPHY (ERCP);  Surgeon: Arta Silence, MD;  Location: [facility] ENDOSCOPY;  Service: Endoscopy;  Laterality: N/A;  . ERCP N/A 09/07/2015   Procedure: ENDOSCOPIC RETROGRADE CHOLANGIOPANCREATOGRAPHY (ERCP);  Surgeon: Clarene Essex, MD;  Location: [facility] ENDOSCOPY;  Service: Endoscopy;  Laterality: N/A;  . EXCISION / CURETTAGE BONE CYST FINGER  2005   rt thumb  . HEMORRHOID SURGERY    . Percutaneous Cholecytostomy tube     IVR insertion 06-13-15(Dr.  Vernard Gambles)- 08-28-15 remains in place to drainage bag  . TONSILLECTOMY    . TOTAL SHOULDER ARTHROPLASTY  02/03/2012   Procedure: TOTAL SHOULDER ARTHROPLASTY;  Surgeon: [name], MD;  Location: [facility];  Service: Orthopedics;  Laterality: Left;    REVIEW OF SYSTEMS:  A comprehensive review of systems was negative except for: Constitutional: positive for fatigue Neurological: positive for paresthesia   PHYSICAL EXAMINATION: General appearance: alert, cooperative and no distress Head: Normocephalic, without obvious abnormality, atraumatic Neck: no adenopathy, no JVD, supple, symmetrical, trachea midline and thyroid not enlarged, symmetric, no tenderness/mass/nodules Lymph nodes: Cervical, supraclavicular, and axillary nodes normal. Resp: clear to auscultation bilaterally Back: symmetric, no curvature. ROM normal. No CVA tenderness. Cardio: regular rate and rhythm, S1, S2 normal, no murmur, click, rub or gallop  GI: soft, non-tender; bowel sounds normal; no masses,  no organomegaly Extremities: extremities normal, atraumatic, no cyanosis or edema  ECOG PERFORMANCE STATUS: 1 - Symptomatic but completely ambulatory  Blood pressure 140/67, pulse 71, temperature 98.1 F (36.7 C), temperature source Oral, resp. rate 18, height 5' 11"  (1.803 m), weight 155 lb 1.6 oz (70.4 kg), SpO2 98 %.  LABORATORY DATA: Lab Results  Component Value Date   WBC 4.0 03/29/2017   HGB 15.0 03/29/2017   HCT 43.6 03/29/2017   MCV 96.2 03/29/2017   PLT 221 03/29/2017      Chemistry      Component Value Date/Time   NA 132 (L) 03/29/2017 1046   K 4.5 03/29/2017 1046   CL 98 (L) 03/15/2017 1206   CO2 26 03/29/2017 1046   BUN 14.4 03/29/2017 1046   CREATININE 0.9 03/29/2017 1046      Component Value Date/Time   CALCIUM 9.8 03/29/2017 1046   ALKPHOS 101 03/29/2017 1046   AST 22 03/29/2017 1046   ALT 15 03/29/2017 1046   BILITOT 0.87 03/29/2017 1046       RADIOGRAPHIC STUDIES: Ct  Chest W Contrast  Result Date: 02/27/2017 CLINICAL DATA:  Lung cancer. EXAM: CT CHEST, ABDOMEN, AND PELVIS WITH CONTRAST TECHNIQUE: Multidetector CT imaging of the chest, abdomen and pelvis was performed following the standard protocol during bolus administration of intravenous contrast. CONTRAST:  186m ISOVUE-300 IOPAMIDOL (ISOVUE-300) INJECTION 61% COMPARISON:  01/02/2017 FINDINGS: CT CHEST FINDINGS Cardiovascular: Heart size normal. Trace pericardial effusion is stable. Coronary artery calcification is noted. Atherosclerotic calcification is noted in the wall of the thoracic aorta. Mediastinum/Nodes: No mediastinal lymphadenopathy. There is no hilar lymphadenopathy. The esophagus has normal imaging features. There is no axillary lymphadenopathy. Lungs/Pleura: Previously seen 12 x 16 mm medial left upper lobe lesion now measures 15 x 18 mm. There is new surrounding airspace disease in the medial left apex, likely radiation related. Changes of emphysema persist. Multiple scattered areas of architectural distortion/ground-glass attenuation in both lungs show no substantial interval change. Index lesion left upper lobe is 17 x 22 mm today compared to 17 x 28 mm previously. Index right upper lobe lesion is 32 x 21 mm today compared to 28 x 22 mm previously. Musculoskeletal: Multiple nonacute posterior right rib fractures are evident. Patient is status post left shoulder replacement. Advanced degenerative changes right shoulder. Advanced degenerative changes lower thoracic spine. CT ABDOMEN PELVIS FINDINGS Hepatobiliary: Hypoattenuating lesions inferior right liver are stable. The tiny hypervascular foci identified in both the right and left lobes of liver are unchanged. Gallbladder surgically absent. No intrahepatic or extrahepatic biliary dilation. Pancreas: No focal mass lesion. No dilatation of the main duct. No intraparenchymal cyst. No peripancreatic edema. Spleen: No splenomegaly. No focal mass lesion.  Adrenals/Urinary Tract: No adrenal nodule or mass. Cortical scarring noted both kidneys without suspicious or worrisome enhancing lesion. No evidence for hydroureter. Bladder is moderately distended with wall irregularity/ trabeculation and apparent bilateral diverticuli. Stomach/Bowel: Stomach is nondistended. No gastric wall thickening. No evidence of outlet obstruction. Duodenum is normally positioned as is the ligament of Treitz. No small bowel wall thickening. No small bowel dilatation. The terminal ileum is normal. The appendix is normal. No gross colonic mass. No colonic wall thickening. No substantial diverticular change. Vascular/Lymphatic: There is abdominal aortic atherosclerosis without aneurysm. There is no gastrohepatic or hepatoduodenal ligament lymphadenopathy. No intraperitoneal or retroperitoneal lymphadenopathy. No pelvic sidewall lymphadenopathy. Reproductive: The prostate gland and seminal vesicles have normal imaging features. Other: No intraperitoneal free  fluid. Musculoskeletal: 14 mm lucent lesion in the right iliac bone is stable. Advanced degenerative changes noted lumbar spine. IMPRESSION: 1. Left upper lobe lesion is stable to minimally increased in the interval with development of surrounding confluent airspace disease. These changes are likely all related to treatment/radiation and continued attention on follow-up recommended. 2. No substantial change in the areas of ground-glass attenuation/architectural distortion in both lungs. Continued attention on follow-up recommended. 3.  Abdominal Aortic Atherosclerois (ICD10-170.0) 4.  Emphysema. (ICD10-J43.9) 5. Changes in the bladder wall suggest bladder outlet obstruction. Electronically Signed   By: Misty Stanley M.D.   On: 02/27/2017 16:31   Ct Abdomen Pelvis W Contrast  Result Date: 02/27/2017 CLINICAL DATA:  Lung cancer. EXAM: CT CHEST, ABDOMEN, AND PELVIS WITH CONTRAST TECHNIQUE: Multidetector CT imaging of the chest, abdomen and  pelvis was performed following the standard protocol during bolus administration of intravenous contrast. CONTRAST:  117m ISOVUE-300 IOPAMIDOL (ISOVUE-300) INJECTION 61% COMPARISON:  01/02/2017 FINDINGS: CT CHEST FINDINGS Cardiovascular: Heart size normal. Trace pericardial effusion is stable. Coronary artery calcification is noted. Atherosclerotic calcification is noted in the wall of the thoracic aorta. Mediastinum/Nodes: No mediastinal lymphadenopathy. There is no hilar lymphadenopathy. The esophagus has normal imaging features. There is no axillary lymphadenopathy. Lungs/Pleura: Previously seen 12 x 16 mm medial left upper lobe lesion now measures 15 x 18 mm. There is new surrounding airspace disease in the medial left apex, likely radiation related. Changes of emphysema persist. Multiple scattered areas of architectural distortion/ground-glass attenuation in both lungs show no substantial interval change. Index lesion left upper lobe is 17 x 22 mm today compared to 17 x 28 mm previously. Index right upper lobe lesion is 32 x 21 mm today compared to 28 x 22 mm previously. Musculoskeletal: Multiple nonacute posterior right rib fractures are evident. Patient is status post left shoulder replacement. Advanced degenerative changes right shoulder. Advanced degenerative changes lower thoracic spine. CT ABDOMEN PELVIS FINDINGS Hepatobiliary: Hypoattenuating lesions inferior right liver are stable. The tiny hypervascular foci identified in both the right and left lobes of liver are unchanged. Gallbladder surgically absent. No intrahepatic or extrahepatic biliary dilation. Pancreas: No focal mass lesion. No dilatation of the main duct. No intraparenchymal cyst. No peripancreatic edema. Spleen: No splenomegaly. No focal mass lesion. Adrenals/Urinary Tract: No adrenal nodule or mass. Cortical scarring noted both kidneys without suspicious or worrisome enhancing lesion. No evidence for hydroureter. Bladder is moderately  distended with wall irregularity/ trabeculation and apparent bilateral diverticuli. Stomach/Bowel: Stomach is nondistended. No gastric wall thickening. No evidence of outlet obstruction. Duodenum is normally positioned as is the ligament of Treitz. No small bowel wall thickening. No small bowel dilatation. The terminal ileum is normal. The appendix is normal. No gross colonic mass. No colonic wall thickening. No substantial diverticular change. Vascular/Lymphatic: There is abdominal aortic atherosclerosis without aneurysm. There is no gastrohepatic or hepatoduodenal ligament lymphadenopathy. No intraperitoneal or retroperitoneal lymphadenopathy. No pelvic sidewall lymphadenopathy. Reproductive: The prostate gland and seminal vesicles have normal imaging features. Other: No intraperitoneal free fluid. Musculoskeletal: 14 mm lucent lesion in the right iliac bone is stable. Advanced degenerative changes noted lumbar spine. IMPRESSION: 1. Left upper lobe lesion is stable to minimally increased in the interval with development of surrounding confluent airspace disease. These changes are likely all related to treatment/radiation and continued attention on follow-up recommended. 2. No substantial change in the areas of ground-glass attenuation/architectural distortion in both lungs. Continued attention on follow-up recommended. 3.  Abdominal Aortic Atherosclerois (ICD10-170.0) 4.  Emphysema. (ICD10-J43.9)  5. Changes in the bladder wall suggest bladder outlet obstruction. Electronically Signed   By: Misty Stanley M.D.   On: 02/27/2017 16:31    ASSESSMENT AND PLAN:  This is a very pleasant 76 years old white male with metastatic non-small cell lung cancer, adenocarcinoma status post systemic chemotherapy with carboplatin and Alimta followed by maintenance Alimta discontinued secondary to disease progression. The patient is currently undergoing treatment with immunotherapy with Nivolumab status post 52 cycles. He is  tolerating the treatment well with no significant adverse effects. I recommended for him to proceed with cycle #53 today as scheduled. He will continue his treatment with Nivolumab every 2 weeks as scheduled. I will see him back for follow-up visit in 4 weeks for evaluation with the start of cycle #55. He was advised to call immediately if he has any concerning symptoms in the interval. The patient voices understanding of current disease status and treatment options and is in agreement with the current care plan. All questions were answered. The patient knows to call the clinic with any problems, questions or concerns. We can certainly see the patient much sooner if necessary. I spent 10 minutes counseling the patient face to face. The total time spent in the appointment was 15 minutes. Disclaimer: This note was dictated with voice recognition software. Similar sounding words can inadvertently be transcribed and may not be corrected upon review.

## 2017-03-31 ENCOUNTER — Telehealth: Payer: Self-pay | Admitting: Internal Medicine

## 2017-03-31 NOTE — Telephone Encounter (Signed)
Scheduled appt per 4/25 los. Patient is aware of appts. Sent scheduling message to MM - patient wanted to see if he can see MM on 5/9 instead of 8/25  Due to conflicting appt.

## 2017-04-03 ENCOUNTER — Telehealth: Payer: Self-pay | Admitting: Internal Medicine

## 2017-04-03 NOTE — Telephone Encounter (Signed)
Called to confirm appt for 5/9 and 5/23 - patient needed to see MD on 5/9 instead of 5/23 - okay per MM patient is aware of appt date and time.

## 2017-04-12 ENCOUNTER — Encounter: Payer: Self-pay | Admitting: Internal Medicine

## 2017-04-12 ENCOUNTER — Other Ambulatory Visit (HOSPITAL_BASED_OUTPATIENT_CLINIC_OR_DEPARTMENT_OTHER): Payer: Medicare Other

## 2017-04-12 ENCOUNTER — Ambulatory Visit (HOSPITAL_BASED_OUTPATIENT_CLINIC_OR_DEPARTMENT_OTHER): Payer: Medicare Other

## 2017-04-12 ENCOUNTER — Telehealth: Payer: Self-pay | Admitting: Internal Medicine

## 2017-04-12 ENCOUNTER — Ambulatory Visit: Payer: Medicare Other | Admitting: Internal Medicine

## 2017-04-12 ENCOUNTER — Other Ambulatory Visit: Payer: Medicare Other

## 2017-04-12 ENCOUNTER — Ambulatory Visit (HOSPITAL_BASED_OUTPATIENT_CLINIC_OR_DEPARTMENT_OTHER): Payer: Medicare Other | Admitting: Internal Medicine

## 2017-04-12 DIAGNOSIS — G893 Neoplasm related pain (acute) (chronic): Secondary | ICD-10-CM

## 2017-04-12 DIAGNOSIS — Z5112 Encounter for antineoplastic immunotherapy: Secondary | ICD-10-CM

## 2017-04-12 DIAGNOSIS — C3490 Malignant neoplasm of unspecified part of unspecified bronchus or lung: Secondary | ICD-10-CM

## 2017-04-12 DIAGNOSIS — C3431 Malignant neoplasm of lower lobe, right bronchus or lung: Secondary | ICD-10-CM

## 2017-04-12 DIAGNOSIS — G620 Drug-induced polyneuropathy: Secondary | ICD-10-CM | POA: Diagnosis not present

## 2017-04-12 DIAGNOSIS — R42 Dizziness and giddiness: Secondary | ICD-10-CM | POA: Diagnosis not present

## 2017-04-12 LAB — COMPREHENSIVE METABOLIC PANEL
ALK PHOS: 92 U/L (ref 40–150)
ALT: 16 U/L (ref 0–55)
AST: 28 U/L (ref 5–34)
Albumin: 3.9 g/dL (ref 3.5–5.0)
Anion Gap: 8 mEq/L (ref 3–11)
BILIRUBIN TOTAL: 0.99 mg/dL (ref 0.20–1.20)
BUN: 23.5 mg/dL (ref 7.0–26.0)
CO2: 27 meq/L (ref 22–29)
CREATININE: 0.8 mg/dL (ref 0.7–1.3)
Calcium: 9.3 mg/dL (ref 8.4–10.4)
Chloride: 100 mEq/L (ref 98–109)
EGFR: 86 mL/min/{1.73_m2} — AB (ref >90)
Glucose: 100 mg/dl (ref 70–140)
Potassium: 3.7 mEq/L (ref 3.5–5.1)
SODIUM: 135 meq/L — AB (ref 136–145)
Total Protein: 6.2 g/dL — ABNORMAL LOW (ref 6.4–8.3)

## 2017-04-12 LAB — CBC WITH DIFFERENTIAL/PLATELET
BASO%: 0.5 % (ref 0.0–2.0)
BASOS ABS: 0 10*3/uL (ref 0.0–0.1)
EOS ABS: 0.1 10*3/uL (ref 0.0–0.5)
EOS%: 2.2 % (ref 0.0–7.0)
HCT: 40.2 % (ref 38.4–49.9)
HGB: 13.9 g/dL (ref 13.0–17.1)
LYMPH%: 14.6 % (ref 14.0–49.0)
MCH: 33.5 pg — AB (ref 27.2–33.4)
MCHC: 34.7 g/dL (ref 32.0–36.0)
MCV: 96.6 fL (ref 79.3–98.0)
MONO#: 0.5 10*3/uL (ref 0.1–0.9)
MONO%: 12 % (ref 0.0–14.0)
NEUT#: 3.2 10*3/uL (ref 1.5–6.5)
NEUT%: 70.7 % (ref 39.0–75.0)
Platelets: 202 10*3/uL (ref 140–400)
RBC: 4.16 10*6/uL — AB (ref 4.20–5.82)
RDW: 14 % (ref 11.0–14.6)
WBC: 4.6 10*3/uL (ref 4.0–10.3)
lymph#: 0.7 10*3/uL — ABNORMAL LOW (ref 0.9–3.3)

## 2017-04-12 MED ORDER — HYDROCODONE-ACETAMINOPHEN 5-325 MG PO TABS: 1.0000 | ORAL_TABLET | ORAL | 0 refills | Status: DC | PRN

## 2017-04-12 MED ORDER — SODIUM CHLORIDE 0.9 % IV SOLN
Freq: Once | INTRAVENOUS | Status: AC
  Administered 2017-04-12: 15:00:00 via INTRAVENOUS

## 2017-04-12 MED ORDER — SODIUM CHLORIDE 0.9 % IV SOLN
240.0000 mg | Freq: Once | INTRAVENOUS | Status: AC
  Administered 2017-04-12: 240 mg via INTRAVENOUS
  Filled 2017-04-12: qty 24

## 2017-04-12 MED ORDER — ALPRAZOLAM 0.25 MG PO TABS
0.2500 mg | ORAL_TABLET | Freq: Three times a day (TID) | ORAL | 0 refills | Status: DC | PRN
Start: 2017-04-12 — End: 2017-05-24

## 2017-04-12 NOTE — Telephone Encounter (Signed)
All appts scheduled per 5/9 los - 3 cycles already scheduled. Central radiology to contact patient with CT schedule.

## 2017-04-12 NOTE — Progress Notes (Signed)
[facility] [phone]   [phone]  OFFICE PROGRESS NOTE  [name], MD [place] [address]  DIAGNOSIS: Stage IV (T1a, N0, M1b) non-small cell lung cancer, adenocarcinoma of the right lower lobe diagnosed in October 2014  Molecular profile: Negative forRET, ALK, BRAF, MET, EGFR.  Positive for ERBB2 A775T, UXL2G401U, KRAS G13E, MAP2K1 D67N (see full report).   PRIOR THERAPY:  1) Systemic chemotherapy with carboplatin for AUC of 5 and Alimta 500 mg/M2 every 3 weeks, status post 6 cycles with stable disease. First dose on 10/23/2013. 2) Maintenance systemic chemotherapy with single agent Alimta 500 mg/M2 every 3 weeks. Status post 8 cycles.  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 Sondra Come completed on 12/16/2014. 4) stereotactic radiotherapy to the enlarging right upper lobe lung nodule under the care of Dr. Sondra Come completed on 11/22/2016  CURRENT THERAPY: Immunotherapy with Nivolumab 240 mg IV every 2 weeks, status post 53 cycles. First cycle was given on 01/28/2015.  INTERVAL HISTORY: Anthony Curry. 76 y.o. male returns to the clinic today for follow-up visit. The patient is feeling well today with no specific complaints. His wife did not come with him today because she is in [place] to visit her terminally ill father. He denied having any chest pain, shortness of breath, cough or hemoptysis. He continues to have dizzy spells as well as peripheral neuropathy. He has no weight loss or night sweats. He has no fever or chills. He is here today for evaluation before starting cycle #54.   MEDICAL HISTORY: Past Medical History:  Diagnosis Date  . 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[place])   . Adrenal mass ([place])   . Alcoholism in remission ([place])   . Anemia of chronic disease   . 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 Anxiety   . BPH (benign prostatic hyperplasia)     . Cancer ([place])    small cell/lung  . Complication of anesthesia 09/08/2015    JITTERY AFTER GALLBLADDER SURGERY  . GERD (gastroesophageal reflux disease)   . History of radiation therapy 11/14/16, 11/18/16, 11/22/16   Left upper lung: 54 Gy in 3 fractions  . Hypertension   . Hypertension 12/07/2016  . Neoplastic malignant related fatigue    IV tx. every 2 weeks last9-14-16 (Dr. Earlie Server), radiation last tx. 6 months  . 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 Persistent atrial fibrillation ([place])   . Pulmonary nodules   . Radiation 12/02/14-12/16/14   Left adrenal gland 30 Gy in 10 fractions    ALLERGIES:  has No Known Allergies.  MEDICATIONS:  Current Outpatient Prescriptions  Medication Sig Dispense Refill  . ALPRAZolam (XANAX) 0.25 MG tablet Take 1 tablet (0.25 mg total) by mouth 3 (three) times daily as needed for anxiety or sleep. And also at night for sleep. 90 tablet 0  . apixaban (ELIQUIS) 5 MG TABS tablet Take 5 mg by mouth 2 (two) times daily.    . Ascorbic Acid (VITAMIN C PO) Take 1,000 mg by mouth daily.     . B Complex-C (SUPER B COMPLEX PO) Take 1 each by mouth daily.     . calcium carbonate (OS-CAL) 600 MG TABS Take 600 mg by mouth daily with breakfast.    . demeclocycline (DECLOMYCIN) 150 MG tablet Take 150 mg by mouth 2 (two) times daily.    [name] diltiazem (CARDIZEM CD) 180 MG 24 hr capsule Take 1 capsule (  180 mg total) by mouth daily at 12 noon. 90 capsule 3  . furosemide (LASIX) 40 MG tablet Take 0.5 tablets (20 mg total) by mouth every other day.    [name] HYDROcodone-acetaminophen (NORCO/VICODIN) 5-325 MG tablet Take 1-2 tablets by mouth every 4 (four) hours as needed for moderate pain. 30 tablet 0  . KLOR-CON M20 20 MEQ tablet Take 20 mEq by mouth every morning.     . Lactobacillus (ACIDOPHILUS) CAPS capsule Take 1 capsule by mouth daily.    [name] MAGNESIUM PO Take 400 mg by mouth daily.     . Multiple Vitamin (MULTIVITAMIN WITH MINERALS) TABS Take 1 tablet by mouth  daily.    . Nivolumab (OPDIVO IV) Inject as directed every 2 weeks    . Nutritional Supplements (ENSURE COMPLETE PO) Take 1 Can by mouth 2 (two) times daily.    . Omega-3 Fatty Acids (FISH OIL PO) Take 1 tablet by mouth daily.    . pantoprazole (PROTONIX) 40 MG tablet Take 40 mg by mouth every morning.  8  . tamsulosin (FLOMAX) 0.4 MG CAPS capsule Take 0.4 mg by mouth daily at 12 noon.     . thiamine (VITAMIN B-1) 100 MG tablet Take 1 tablet (100 mg total) by mouth daily. 30 tablet 6  . vitamin E 400 UNIT capsule Take 400 Units by mouth daily.     No current facility-administered medications for this visit.    Facility-Administered Medications Ordered in Other Visits  Medication Dose Route Frequency Provider Last Rate Last Dose  . 0.9 %  sodium chloride infusion   Intravenous Once [name], MD        SURGICAL HISTORY:  Past Surgical History:  Procedure Laterality Date  . ANKLE ARTHRODESIS  1995   rt fx-hardware in  . CARDIOVERSION N/A 09/29/2014   Procedure: CARDIOVERSION;  Surgeon: Josue Hector, MD;  Location: [facility] ENDOSCOPY;  Service: Cardiovascular;  Laterality: N/A;  . CHOLECYSTECTOMY  09/08/2015    laproscopic   . CHOLECYSTECTOMY N/A 09/08/2015   Procedure: LAPAROSCOPIC CHOLECYSTECTOMY WITH ATTEMPTED INTRAOPERATIVE CHOLANGIOGRAM ;  Surgeon: Fanny Skates, MD;  Location: [facility];  Service: General;  Laterality: N/A;  . COLONOSCOPY    . ERCP N/A 09/02/2015   Procedure: ENDOSCOPIC RETROGRADE CHOLANGIOPANCREATOGRAPHY (ERCP);  Surgeon: Arta Silence, MD;  Location: [facility] ENDOSCOPY;  Service: Endoscopy;  Laterality: N/A;  . ERCP N/A 09/07/2015   Procedure: ENDOSCOPIC RETROGRADE CHOLANGIOPANCREATOGRAPHY (ERCP);  Surgeon: Clarene Essex, MD;  Location: [facility] ENDOSCOPY;  Service: Endoscopy;  Laterality: N/A;  . EXCISION / CURETTAGE BONE CYST FINGER  2005   rt thumb  . HEMORRHOID SURGERY    . Percutaneous Cholecytostomy tube     IVR insertion 06-13-15(Dr. Vernard Gambles)- 08-28-15 remains in place to  drainage bag  . TONSILLECTOMY    . TOTAL SHOULDER ARTHROPLASTY  02/03/2012   Procedure: TOTAL SHOULDER ARTHROPLASTY;  Surgeon: [name], MD;  Location: [facility];  Service: Orthopedics;  Laterality: Left;    REVIEW OF SYSTEMS:  A comprehensive review of systems was negative except for: Neurological: positive for dizziness and paresthesia   PHYSICAL EXAMINATION: General appearance: alert, cooperative and no distress Head: Normocephalic, without obvious abnormality, atraumatic Neck: no adenopathy, no JVD, supple, symmetrical, trachea midline and thyroid not enlarged, symmetric, no tenderness/mass/nodules Lymph nodes: Cervical, supraclavicular, and axillary nodes normal. Resp: clear to auscultation bilaterally Back: symmetric, no curvature. ROM normal. No CVA tenderness. Cardio: regular rate and rhythm, S1, S2 normal, no murmur, click, rub or gallop GI: soft,  non-tender; bowel sounds normal; no masses,  no organomegaly Extremities: extremities normal, atraumatic, no cyanosis or edema  ECOG PERFORMANCE STATUS: 1 - Symptomatic but completely ambulatory  Blood pressure (!) 162/72, pulse 73, temperature 98.3 F (36.8 C), temperature source Oral, resp. rate 19, height _0  (1.803 m), weight 153 lb 8 oz (69.6 kg), SpO2 98 %.  LABORATORY DATA: Lab Results  Component Value Date   WBC 4.6 04/12/2017   HGB 13.9 04/12/2017   HCT 40.2 04/12/2017   MCV 96.6 04/12/2017   PLT 202 04/12/2017      Chemistry      Component Value Date/Time   NA 132 (L) 03/29/2017 1046   K 4.5 03/29/2017 1046   CL 98 (L) 03/15/2017 1206   CO2 26 03/29/2017 1046   BUN 14.4 03/29/2017 1046   CREATININE 0.9 03/29/2017 1046      Component Value Date/Time   CALCIUM 9.8 03/29/2017 1046   ALKPHOS 101 03/29/2017 1046   AST 22 03/29/2017 1046   ALT 15 03/29/2017 1046   BILITOT 0.87 03/29/2017 1046       RADIOGRAPHIC STUDIES: No results found.  ASSESSMENT AND PLAN:  This is a very  pleasant 76 years old white male with metastatic non-small cell lung cancer, adenocarcinoma status post systemic chemotherapy with carboplatin and Alimta followed by maintenance Alimta which was discontinued secondary to disease progression. He is currently undergoing second line treatment with immunotherapy with Nivolumab status post 53 cycles. The patient is tolerating the treatment well. I recommended for him to proceed with cycle #54 today as a scheduled. He will continue his treatment every 2 weeks. I will see him back for follow-up visit in 4 weeks for reevaluation with repeat CT scan of the chest, abdomen and pelvis for restaging of his disease. For the dizzy spells and peripheral neuropathy he was evaluated in the past by neurology and no clear etiology was identified. He was advised to call immediately if he has any concerning symptoms in the interval. The patient voices understanding of current disease status and treatment options and is in agreement with the current care plan. All questions were answered. The patient knows to call the clinic with any problems, questions or concerns. We can certainly see the patient much sooner if necessary. I spent 10 minutes counseling the patient face to face. The total time spent in the appointment was 15 minutes. Disclaimer: This note was dictated with voice recognition software. Similar sounding words can inadvertently be transcribed and may not be corrected upon review.

## 2017-04-12 NOTE — Patient Instructions (Signed)
[facility] Discharge Instructions for Patients Receiving Chemotherapy  Today you received the following chemotherapy agents Opdivo.  To help prevent nausea and vomiting after your treatment, we encourage you to take your nausea medication as directed.   If you develop nausea and vomiting that is not controlled by your nausea medication, call the clinic.   BELOW ARE SYMPTOMS THAT SHOULD BE REPORTED IMMEDIATELY:  *FEVER GREATER THAN 100.5 F  *CHILLS WITH OR WITHOUT FEVER  NAUSEA AND VOMITING THAT IS NOT CONTROLLED WITH YOUR NAUSEA MEDICATION  *UNUSUAL SHORTNESS OF BREATH  *UNUSUAL BRUISING OR BLEEDING  TENDERNESS IN MOUTH AND THROAT WITH OR WITHOUT PRESENCE OF ULCERS  *URINARY PROBLEMS  *BOWEL PROBLEMS  UNUSUAL RASH Items with * indicate a potential emergency and should be followed up as soon as possible.  Feel free to call the clinic you have any questions or concerns. The clinic phone number is [phone].  Please show the CHEMO ALERT CARD at check-in to the Emergency Department and triage nurse.    

## 2017-04-26 ENCOUNTER — Other Ambulatory Visit (HOSPITAL_BASED_OUTPATIENT_CLINIC_OR_DEPARTMENT_OTHER): Payer: Medicare Other

## 2017-04-26 ENCOUNTER — Ambulatory Visit (HOSPITAL_BASED_OUTPATIENT_CLINIC_OR_DEPARTMENT_OTHER): Payer: Medicare Other

## 2017-04-26 ENCOUNTER — Ambulatory Visit: Payer: Medicare Other

## 2017-04-26 ENCOUNTER — Ambulatory Visit: Payer: Medicare Other | Admitting: Internal Medicine

## 2017-04-26 ENCOUNTER — Other Ambulatory Visit: Payer: Medicare Other

## 2017-04-26 VITALS — BP 150/73 | HR 59 | Resp 18

## 2017-04-26 DIAGNOSIS — C3431 Malignant neoplasm of lower lobe, right bronchus or lung: Secondary | ICD-10-CM

## 2017-04-26 DIAGNOSIS — Z5112 Encounter for antineoplastic immunotherapy: Secondary | ICD-10-CM

## 2017-04-26 DIAGNOSIS — H353221 Exudative age-related macular degeneration, left eye, with active choroidal neovascularization: Secondary | ICD-10-CM | POA: Diagnosis not present

## 2017-04-26 LAB — COMPREHENSIVE METABOLIC PANEL
ALBUMIN: 3.9 g/dL (ref 3.5–5.0)
ALK PHOS: 104 U/L (ref 40–150)
ALT: 13 U/L (ref 0–55)
ANION GAP: 10 meq/L (ref 3–11)
AST: 20 U/L (ref 5–34)
BUN: 15.9 mg/dL (ref 7.0–26.0)
CALCIUM: 10.2 mg/dL (ref 8.4–10.4)
CO2: 27 mEq/L (ref 22–29)
Chloride: 99 mEq/L (ref 98–109)
Creatinine: 0.8 mg/dL (ref 0.7–1.3)
EGFR: 85 mL/min/{1.73_m2} — AB (ref >90)
Glucose: 82 mg/dl (ref 70–140)
POTASSIUM: 4.6 meq/L (ref 3.5–5.1)
Sodium: 136 mEq/L (ref 136–145)
Total Bilirubin: 0.87 mg/dL (ref 0.20–1.20)
Total Protein: 6.2 g/dL — ABNORMAL LOW (ref 6.4–8.3)

## 2017-04-26 LAB — CBC WITH DIFFERENTIAL/PLATELET
BASO%: 0.2 % (ref 0.0–2.0)
BASOS ABS: 0 10*3/uL (ref 0.0–0.1)
EOS%: 6.9 % (ref 0.0–7.0)
Eosinophils Absolute: 0.4 10*3/uL (ref 0.0–0.5)
HEMATOCRIT: 41.5 % (ref 38.4–49.9)
HEMOGLOBIN: 14.5 g/dL (ref 13.0–17.1)
LYMPH#: 1.5 10*3/uL (ref 0.9–3.3)
LYMPH%: 29.6 % (ref 14.0–49.0)
MCH: 33.4 pg (ref 27.2–33.4)
MCHC: 34.9 g/dL (ref 32.0–36.0)
MCV: 95.6 fL (ref 79.3–98.0)
MONO#: 0.8 10*3/uL (ref 0.1–0.9)
MONO%: 16 % — ABNORMAL HIGH (ref 0.0–14.0)
NEUT%: 47.3 % (ref 39.0–75.0)
NEUTROS ABS: 2.4 10*3/uL (ref 1.5–6.5)
PLATELETS: 189 10*3/uL (ref 140–400)
RBC: 4.34 10*6/uL (ref 4.20–5.82)
RDW: 14.2 % (ref 11.0–14.6)
WBC: 5.1 10*3/uL (ref 4.0–10.3)

## 2017-04-26 MED ORDER — SODIUM CHLORIDE 0.9 % IV SOLN
Freq: Once | INTRAVENOUS | Status: AC
  Administered 2017-04-26: 09:00:00 via INTRAVENOUS

## 2017-04-26 MED ORDER — NIVOLUMAB CHEMO INJECTION 100 MG/10ML
240.0000 mg | Freq: Once | INTRAVENOUS | Status: AC
  Administered 2017-04-26: 240 mg via INTRAVENOUS
  Filled 2017-04-26: qty 24

## 2017-04-26 NOTE — Patient Instructions (Addendum)
[facility] Discharge Instructions for Patients Receiving Chemotherapy  Today you received the following chemotherapy agent(s) nivolumab (Opdivo).  To help prevent nausea and vomiting after your treatment, we encourage you to take your nausea medication as directed.    If you develop nausea and vomiting that is not controlled by your nausea medication, call the clinic.   BELOW ARE SYMPTOMS THAT SHOULD BE REPORTED IMMEDIATELY:  *FEVER GREATER THAN 100.5 F  *CHILLS WITH OR WITHOUT FEVER  NAUSEA AND VOMITING THAT IS NOT CONTROLLED WITH YOUR NAUSEA MEDICATION  *UNUSUAL SHORTNESS OF BREATH  *UNUSUAL BRUISING OR BLEEDING  TENDERNESS IN MOUTH AND THROAT WITH OR WITHOUT PRESENCE OF ULCERS  *URINARY PROBLEMS  *BOWEL PROBLEMS  UNUSUAL RASH Items with * indicate a potential emergency and should be followed up as soon as possible.  Feel free to call the clinic you have any questions or concerns. The clinic phone number is [phone].  Please show the [place] at check-in to the Emergency Department and triage nurse.

## 2017-05-09 ENCOUNTER — Encounter (HOSPITAL_COMMUNITY): Payer: Self-pay

## 2017-05-09 ENCOUNTER — Ambulatory Visit (HOSPITAL_COMMUNITY)
Admission: RE | Admit: 2017-05-09 | Discharge: 2017-05-09 | Disposition: A | Discharge status: Home / Self Care | Payer: Medicare Other | Source: Ambulatory Visit | Attending: Internal Medicine | Admitting: Internal Medicine

## 2017-05-09 DIAGNOSIS — R918 Other nonspecific abnormal finding of lung field: Secondary | ICD-10-CM | POA: Insufficient documentation

## 2017-05-09 DIAGNOSIS — I2510 Atherosclerotic heart disease of native coronary artery without angina pectoris: Secondary | ICD-10-CM | POA: Diagnosis not present

## 2017-05-09 DIAGNOSIS — N329 Bladder disorder, unspecified: Secondary | ICD-10-CM | POA: Insufficient documentation

## 2017-05-09 DIAGNOSIS — C3490 Malignant neoplasm of unspecified part of unspecified bronchus or lung: Secondary | ICD-10-CM | POA: Diagnosis not present

## 2017-05-09 DIAGNOSIS — J439 Emphysema, unspecified: Secondary | ICD-10-CM | POA: Diagnosis not present

## 2017-05-09 DIAGNOSIS — G893 Neoplasm related pain (acute) (chronic): Secondary | ICD-10-CM

## 2017-05-09 DIAGNOSIS — I700 Atherosclerosis of aorta: Secondary | ICD-10-CM | POA: Diagnosis not present

## 2017-05-09 DIAGNOSIS — N3289 Other specified disorders of bladder: Secondary | ICD-10-CM | POA: Diagnosis not present

## 2017-05-09 MED ORDER — IOPAMIDOL (ISOVUE-300) INJECTION 61%
100.0000 mL | Freq: Once | INTRAVENOUS | Status: AC | PRN
  Administered 2017-05-09: 100 mL via INTRAVENOUS

## 2017-05-09 MED ORDER — IOPAMIDOL (ISOVUE-300) INJECTION 61%
INTRAVENOUS | Status: AC
  Filled 2017-05-09: qty 100

## 2017-05-10 ENCOUNTER — Ambulatory Visit (HOSPITAL_BASED_OUTPATIENT_CLINIC_OR_DEPARTMENT_OTHER): Payer: Medicare Other

## 2017-05-10 ENCOUNTER — Other Ambulatory Visit (HOSPITAL_BASED_OUTPATIENT_CLINIC_OR_DEPARTMENT_OTHER): Payer: Medicare Other

## 2017-05-10 ENCOUNTER — Other Ambulatory Visit: Payer: Medicare Other

## 2017-05-10 ENCOUNTER — Ambulatory Visit: Payer: Medicare Other | Admitting: Internal Medicine

## 2017-05-10 ENCOUNTER — Other Ambulatory Visit: Payer: Self-pay | Admitting: Internal Medicine

## 2017-05-10 VITALS — BP 157/78 | HR 60 | Temp 97.9°F | Resp 18

## 2017-05-10 DIAGNOSIS — C3431 Malignant neoplasm of lower lobe, right bronchus or lung: Secondary | ICD-10-CM

## 2017-05-10 DIAGNOSIS — Z5111 Encounter for antineoplastic chemotherapy: Secondary | ICD-10-CM

## 2017-05-10 LAB — CBC WITH DIFFERENTIAL/PLATELET
BASO%: 0.4 % (ref 0.0–2.0)
BASOS ABS: 0 10*3/uL (ref 0.0–0.1)
EOS ABS: 0.2 10*3/uL (ref 0.0–0.5)
EOS%: 4.7 % (ref 0.0–7.0)
HCT: 42.9 % (ref 38.4–49.9)
HGB: 15.2 g/dL (ref 13.0–17.1)
LYMPH%: 21.9 % (ref 14.0–49.0)
MCH: 33.7 pg — ABNORMAL HIGH (ref 27.2–33.4)
MCHC: 35.4 g/dL (ref 32.0–36.0)
MCV: 95.1 fL (ref 79.3–98.0)
MONO#: 0.5 10*3/uL (ref 0.1–0.9)
MONO%: 11.6 % (ref 0.0–14.0)
NEUT#: 2.9 10*3/uL (ref 1.5–6.5)
NEUT%: 61.4 % (ref 39.0–75.0)
PLATELETS: 203 10*3/uL (ref 140–400)
RBC: 4.51 10*6/uL (ref 4.20–5.82)
RDW: 14.1 % (ref 11.0–14.6)
WBC: 4.7 10*3/uL (ref 4.0–10.3)
lymph#: 1 10*3/uL (ref 0.9–3.3)

## 2017-05-10 LAB — COMPREHENSIVE METABOLIC PANEL
ALBUMIN: 4.1 g/dL (ref 3.5–5.0)
ALT: 14 U/L (ref 0–55)
AST: 23 U/L (ref 5–34)
Alkaline Phosphatase: 95 U/L (ref 40–150)
Anion Gap: 9 mEq/L (ref 3–11)
BUN: 15 mg/dL (ref 7.0–26.0)
CHLORIDE: 98 meq/L (ref 98–109)
CO2: 25 mEq/L (ref 22–29)
Calcium: 9.9 mg/dL (ref 8.4–10.4)
Creatinine: 0.8 mg/dL (ref 0.7–1.3)
EGFR: 86 mL/min/{1.73_m2} — AB (ref >90)
GLUCOSE: 87 mg/dL (ref 70–140)
POTASSIUM: 4.4 meq/L (ref 3.5–5.1)
SODIUM: 132 meq/L — AB (ref 136–145)
Total Bilirubin: 0.97 mg/dL (ref 0.20–1.20)
Total Protein: 6.5 g/dL (ref 6.4–8.3)

## 2017-05-10 MED ORDER — SODIUM CHLORIDE 0.9 % IV SOLN
Freq: Once | INTRAVENOUS | Status: AC
  Administered 2017-05-10: 14:00:00 via INTRAVENOUS

## 2017-05-10 MED ORDER — SODIUM CHLORIDE 0.9 % IV SOLN
240.0000 mg | Freq: Once | INTRAVENOUS | Status: AC
  Administered 2017-05-10: 240 mg via INTRAVENOUS
  Filled 2017-05-10: qty 24

## 2017-05-10 NOTE — Patient Instructions (Signed)
[facility] Discharge Instructions for Patients Receiving Chemotherapy  Today you received the following chemotherapy agents Opdivo.  To help prevent nausea and vomiting after your treatment, we encourage you to take your nausea medication as directed.   If you develop nausea and vomiting that is not controlled by your nausea medication, call the clinic.   BELOW ARE SYMPTOMS THAT SHOULD BE REPORTED IMMEDIATELY:  *FEVER GREATER THAN 100.5 F  *CHILLS WITH OR WITHOUT FEVER  NAUSEA AND VOMITING THAT IS NOT CONTROLLED WITH YOUR NAUSEA MEDICATION  *UNUSUAL SHORTNESS OF BREATH  *UNUSUAL BRUISING OR BLEEDING  TENDERNESS IN MOUTH AND THROAT WITH OR WITHOUT PRESENCE OF ULCERS  *URINARY PROBLEMS  *BOWEL PROBLEMS  UNUSUAL RASH Items with * indicate a potential emergency and should be followed up as soon as possible.  Feel free to call the clinic you have any questions or concerns. The clinic phone number is [phone].  Please show the CHEMO ALERT CARD at check-in to the Emergency Department and triage nurse.    

## 2017-05-15 ENCOUNTER — Other Ambulatory Visit: Payer: Self-pay | Admitting: *Deleted

## 2017-05-15 DIAGNOSIS — C3490 Malignant neoplasm of unspecified part of unspecified bronchus or lung: Secondary | ICD-10-CM

## 2017-05-15 DIAGNOSIS — G893 Neoplasm related pain (acute) (chronic): Secondary | ICD-10-CM

## 2017-05-15 MED ORDER — HYDROCODONE-ACETAMINOPHEN 5-325 MG PO TABS: 1.0000 | ORAL_TABLET | ORAL | 0 refills | Status: DC | PRN

## 2017-05-15 NOTE — Telephone Encounter (Signed)
Pt called for pain med refill, ok to pick up.

## 2017-05-24 ENCOUNTER — Telehealth: Payer: Self-pay | Admitting: Internal Medicine

## 2017-05-24 ENCOUNTER — Ambulatory Visit (HOSPITAL_BASED_OUTPATIENT_CLINIC_OR_DEPARTMENT_OTHER): Payer: Medicare Other | Admitting: Internal Medicine

## 2017-05-24 ENCOUNTER — Encounter: Payer: Self-pay | Admitting: Internal Medicine

## 2017-05-24 ENCOUNTER — Other Ambulatory Visit (HOSPITAL_BASED_OUTPATIENT_CLINIC_OR_DEPARTMENT_OTHER): Payer: Medicare Other

## 2017-05-24 ENCOUNTER — Encounter: Payer: Self-pay | Admitting: *Deleted

## 2017-05-24 ENCOUNTER — Ambulatory Visit (HOSPITAL_BASED_OUTPATIENT_CLINIC_OR_DEPARTMENT_OTHER): Payer: Medicare Other

## 2017-05-24 DIAGNOSIS — R42 Dizziness and giddiness: Secondary | ICD-10-CM | POA: Diagnosis not present

## 2017-05-24 DIAGNOSIS — C7972 Secondary malignant neoplasm of left adrenal gland: Secondary | ICD-10-CM

## 2017-05-24 DIAGNOSIS — C3431 Malignant neoplasm of lower lobe, right bronchus or lung: Secondary | ICD-10-CM | POA: Diagnosis not present

## 2017-05-24 DIAGNOSIS — G620 Drug-induced polyneuropathy: Secondary | ICD-10-CM

## 2017-05-24 DIAGNOSIS — G893 Neoplasm related pain (acute) (chronic): Secondary | ICD-10-CM

## 2017-05-24 DIAGNOSIS — Z5112 Encounter for antineoplastic immunotherapy: Secondary | ICD-10-CM

## 2017-05-24 DIAGNOSIS — C3490 Malignant neoplasm of unspecified part of unspecified bronchus or lung: Secondary | ICD-10-CM

## 2017-05-24 DIAGNOSIS — Z79899 Other long term (current) drug therapy: Secondary | ICD-10-CM

## 2017-05-24 LAB — COMPREHENSIVE METABOLIC PANEL
ALBUMIN: 4 g/dL (ref 3.5–5.0)
ALK PHOS: 96 U/L (ref 40–150)
ALT: 14 U/L (ref 0–55)
AST: 22 U/L (ref 5–34)
Anion Gap: 12 mEq/L — ABNORMAL HIGH (ref 3–11)
BILIRUBIN TOTAL: 0.95 mg/dL (ref 0.20–1.20)
BUN: 16.8 mg/dL (ref 7.0–26.0)
CALCIUM: 9.8 mg/dL (ref 8.4–10.4)
CO2: 26 mEq/L (ref 22–29)
Chloride: 99 mEq/L (ref 98–109)
Creatinine: 0.8 mg/dL (ref 0.7–1.3)
EGFR: 84 mL/min/{1.73_m2} — AB (ref >90)
Glucose: 89 mg/dl (ref 70–140)
POTASSIUM: 4.2 meq/L (ref 3.5–5.1)
Sodium: 136 mEq/L (ref 136–145)
TOTAL PROTEIN: 6.6 g/dL (ref 6.4–8.3)

## 2017-05-24 LAB — CBC WITH DIFFERENTIAL/PLATELET
BASO%: 0.6 % (ref 0.0–2.0)
BASOS ABS: 0 10*3/uL (ref 0.0–0.1)
EOS ABS: 0.2 10*3/uL (ref 0.0–0.5)
EOS%: 6.1 % (ref 0.0–7.0)
HEMATOCRIT: 44.9 % (ref 38.4–49.9)
HEMOGLOBIN: 15.4 g/dL (ref 13.0–17.1)
LYMPH#: 0.8 10*3/uL — AB (ref 0.9–3.3)
LYMPH%: 19.8 % (ref 14.0–49.0)
MCH: 33.1 pg (ref 27.2–33.4)
MCHC: 34.2 g/dL (ref 32.0–36.0)
MCV: 96.9 fL (ref 79.3–98.0)
MONO#: 0.5 10*3/uL (ref 0.1–0.9)
MONO%: 11.4 % (ref 0.0–14.0)
NEUT#: 2.5 10*3/uL (ref 1.5–6.5)
NEUT%: 62.1 % (ref 39.0–75.0)
Platelets: 196 10*3/uL (ref 140–400)
RBC: 4.64 10*6/uL (ref 4.20–5.82)
RDW: 14.1 % (ref 11.0–14.6)
WBC: 4 10*3/uL (ref 4.0–10.3)

## 2017-05-24 LAB — TSH: TSH: 6.355 m(IU)/L — ABNORMAL HIGH (ref 0.320–4.118)

## 2017-05-24 MED ORDER — SODIUM CHLORIDE 0.9 % IV SOLN
Freq: Once | INTRAVENOUS | Status: AC
  Administered 2017-05-24: 13:00:00 via INTRAVENOUS

## 2017-05-24 MED ORDER — ALPRAZOLAM 0.25 MG PO TABS: 0.2500 mg | ORAL_TABLET | Freq: Three times a day (TID) | ORAL | 0 refills | Status: DC | PRN

## 2017-05-24 MED ORDER — NIVOLUMAB CHEMO INJECTION 100 MG/10ML
240.0000 mg | Freq: Once | INTRAVENOUS | Status: AC
  Administered 2017-05-24: 240 mg via INTRAVENOUS
  Filled 2017-05-24: qty 24

## 2017-05-24 NOTE — Progress Notes (Signed)
[place] [phone]   [phone]) [phone]  OFFICE PROGRESS NOTE  [name], MD [place] [address]  DIAGNOSIS: Stage IV (T1a, N0, M1b) non-small cell lung cancer, adenocarcinoma of the right lower lobe diagnosed in October 2014  Molecular profile: Negative forRET, ALK, BRAF, MET, EGFR.  Positive for ERBB2 A775T, [id number], KRAS G13E, MAP2K1 D67N (see full report).   PRIOR THERAPY:  1) Systemic chemotherapy with carboplatin for AUC of 5 and Alimta 500 mg/M2 every 3 weeks, status post 6 cycles with stable disease. First dose on 10/23/2013. 2) Maintenance systemic chemotherapy with single agent Alimta 500 mg/M2 every 3 weeks. Status post 8 cycles.  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 Sondra Come completed on 12/16/2014. 4) stereotactic radiotherapy to the enlarging right upper lobe lung nodule under the care of Dr. Sondra Come completed on 11/22/2016  CURRENT THERAPY: Immunotherapy with Nivolumab 240 mg IV every 2 weeks, status post 56 cycles. First cycle was given on 01/28/2015.  INTERVAL HISTORY: Anthony Curry. 76 y.o. male returns to the clinic today for follow-up visit accompanied by his wife. The patient is currently on treatment with immunotherapy with Nivolumab status post 56 cycles and has been tolerating this treatment fairly well. He denied having any chest pain, shortness of breath, cough or hemoptysis. He continues to have peripheral neuropathy and he was evaluated by neurology in the past. He also has occasional dizzy spells. The patient denied having any nausea, vomiting, diarrhea or constipation. He is here today for evaluation before starting cycle #57.   MEDICAL HISTORY: Past Medical History:  Diagnosis Date  . 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[place])   . Adrenal mass ([place])   . Alcoholism in remission ([place])   . Anemia of chronic disease   . Antineoplastic chemotherapy  induced anemia(285.3)   . Anxiety   . BPH (benign prostatic hyperplasia)   . Cancer ([place])    small cell/lung  . Complication of anesthesia 09/08/2015    JITTERY AFTER GALLBLADDER SURGERY  . GERD (gastroesophageal reflux disease)   . History of radiation therapy 11/14/16, 11/18/16, 11/22/16   Left upper lung: 54 Gy in 3 fractions  . Hypertension   . Hypertension 12/07/2016  . Neoplastic malignant related fatigue    IV tx. every 2 weeks last9-14-16 (Dr. Earlie Server), radiation last tx. 6 months  . 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 Persistent atrial fibrillation ([place])   . Pulmonary nodules   . Radiation 12/02/14-12/16/14   Left adrenal gland 30 Gy in 10 fractions    ALLERGIES:  has No Known Allergies.  MEDICATIONS:  Current Outpatient Prescriptions  Medication Sig Dispense Refill  . ALPRAZolam (XANAX) 0.25 MG tablet Take 1 tablet (0.25 mg total) by mouth 3 (three) times daily as needed for anxiety or sleep. And also at night for sleep. 90 tablet 0  . apixaban (ELIQUIS) 5 MG TABS tablet Take 5 mg by mouth 2 (two) times daily.    . Ascorbic Acid (VITAMIN C PO) Take 1,000 mg by mouth daily.     . B Complex-C (SUPER B COMPLEX PO) Take 1 each by mouth daily.     . calcium carbonate (OS-CAL) 600 MG TABS Take 600 mg by mouth daily with breakfast.    . demeclocycline (DECLOMYCIN) 150 MG tablet Take 150 mg by mouth 2 (two) times daily.    [name] diltiazem (CARDIZEM CD) 180 MG 24 hr capsule Take  1 capsule (180 mg total) by mouth daily at 12 noon. 90 capsule 3  . furosemide (LASIX) 40 MG tablet Take 0.5 tablets (20 mg total) by mouth every other day.    [name] HYDROcodone-acetaminophen (NORCO/VICODIN) 5-325 MG tablet Take 1-2 tablets by mouth every 4 (four) hours as needed for moderate pain. 30 tablet 0  . KLOR-CON M20 20 MEQ tablet Take 20 mEq by mouth every morning.     . Lactobacillus (ACIDOPHILUS) CAPS capsule Take 1 capsule by mouth daily.    [name] MAGNESIUM PO Take 400 mg by mouth daily.     .  Multiple Vitamin (MULTIVITAMIN WITH MINERALS) TABS Take 1 tablet by mouth daily.    . Nivolumab (OPDIVO IV) Inject as directed every 2 weeks    . Nutritional Supplements (ENSURE COMPLETE PO) Take 1 Can by mouth 2 (two) times daily.    . Omega-3 Fatty Acids (FISH OIL PO) Take 1 tablet by mouth daily.    . pantoprazole (PROTONIX) 40 MG tablet Take 40 mg by mouth every morning.  8  . tamsulosin (FLOMAX) 0.4 MG CAPS capsule Take 0.4 mg by mouth daily at 12 noon.     . thiamine (VITAMIN B-1) 100 MG tablet Take 1 tablet (100 mg total) by mouth daily. 30 tablet 6  . vitamin E 400 UNIT capsule Take 400 Units by mouth daily.     No current facility-administered medications for this visit.    Facility-Administered Medications Ordered in Other Visits  Medication Dose Route Frequency Provider Last Rate Last Dose  . 0.9 %  sodium chloride infusion   Intravenous Once [name], MD        SURGICAL HISTORY:  Past Surgical History:  Procedure Laterality Date  . ANKLE ARTHRODESIS  1995   rt fx-hardware in  . CARDIOVERSION N/A 09/29/2014   Procedure: CARDIOVERSION;  Surgeon: Josue Hector, MD;  Location: [facility] ENDOSCOPY;  Service: Cardiovascular;  Laterality: N/A;  . CHOLECYSTECTOMY  09/08/2015    laproscopic   . CHOLECYSTECTOMY N/A 09/08/2015   Procedure: LAPAROSCOPIC CHOLECYSTECTOMY WITH ATTEMPTED INTRAOPERATIVE CHOLANGIOGRAM ;  Surgeon: Fanny Skates, MD;  Location: [facility];  Service: General;  Laterality: N/A;  . COLONOSCOPY    . ERCP N/A 09/02/2015   Procedure: ENDOSCOPIC RETROGRADE CHOLANGIOPANCREATOGRAPHY (ERCP);  Surgeon: Arta Silence, MD;  Location: [facility] ENDOSCOPY;  Service: Endoscopy;  Laterality: N/A;  . ERCP N/A 09/07/2015   Procedure: ENDOSCOPIC RETROGRADE CHOLANGIOPANCREATOGRAPHY (ERCP);  Surgeon: Clarene Essex, MD;  Location: [facility] ENDOSCOPY;  Service: Endoscopy;  Laterality: N/A;  . EXCISION / CURETTAGE BONE CYST FINGER  2005   rt thumb  . HEMORRHOID SURGERY    . Percutaneous  Cholecytostomy tube     IVR insertion 06-13-15(Dr. Vernard Gambles)- 08-28-15 remains in place to drainage bag  . TONSILLECTOMY    . TOTAL SHOULDER ARTHROPLASTY  02/03/2012   Procedure: TOTAL SHOULDER ARTHROPLASTY;  Surgeon: [name], MD;  Location: [facility];  Service: Orthopedics;  Laterality: Left;    REVIEW OF SYSTEMS:  A comprehensive review of systems was negative except for: Neurological: positive for dizziness and paresthesia   PHYSICAL EXAMINATION: General appearance: alert, cooperative and no distress Head: Normocephalic, without obvious abnormality, atraumatic Neck: no adenopathy, no JVD, supple, symmetrical, trachea midline and thyroid not enlarged, symmetric, no tenderness/mass/nodules Lymph nodes: Cervical, supraclavicular, and axillary nodes normal. Resp: clear to auscultation bilaterally Back: symmetric, no curvature. ROM normal. No CVA tenderness. Cardio: regular rate and rhythm, S1, S2 normal, no murmur, click, rub or gallop  GI: soft, non-tender; bowel sounds normal; no masses,  no organomegaly Extremities: extremities normal, atraumatic, no cyanosis or edema  ECOG PERFORMANCE STATUS: 1 - Symptomatic but completely ambulatory  Blood pressure (!) 158/72, pulse 68, temperature 97.8 F (36.6 C), temperature source Oral, resp. rate 18, height 5' 11"  (1.803 m), weight 151 lb 12.8 oz (68.9 kg), SpO2 100 %.  LABORATORY DATA: Lab Results  Component Value Date   WBC 4.0 05/24/2017   HGB 15.4 05/24/2017   HCT 44.9 05/24/2017   MCV 96.9 05/24/2017   PLT 196 05/24/2017      Chemistry      Component Value Date/Time   NA 136 05/24/2017 1115   K 4.2 05/24/2017 1115   CL 98 (L) 03/15/2017 1206   CO2 26 05/24/2017 1115   BUN 16.8 05/24/2017 1115   CREATININE 0.8 05/24/2017 1115      Component Value Date/Time   CALCIUM 9.8 05/24/2017 1115   ALKPHOS 96 05/24/2017 1115   AST 22 05/24/2017 1115   ALT 14 05/24/2017 1115   BILITOT 0.95 05/24/2017 1115        RADIOGRAPHIC STUDIES: Ct Chest W Contrast  Result Date: 05/09/2017 CLINICAL DATA:  Followup lung cancer. EXAM: CT CHEST, ABDOMEN, AND PELVIS WITH CONTRAST TECHNIQUE: Multidetector CT imaging of the chest, abdomen and pelvis was performed following the standard protocol during bolus administration of intravenous contrast. CONTRAST:  185m ISOVUE-300 IOPAMIDOL (ISOVUE-300) INJECTION 61% COMPARISON:  03/01/2017 FINDINGS: CT CHEST FINDINGS Cardiovascular: Normal heart size. There is a trace pericardial effusion which appears similar to previous exam. Aortic atherosclerosis. Calcification within the RCA and LAD coronary artery noted. Mediastinum/Nodes: The trachea appears patent and is midline. Normal appearance of the esophagus. No enlarged mediastinal or hilar adenopathy. No axillary or supraclavicular adenopathy. Lungs/Pleura: Moderate to advanced changes of centrilobular and paraseptal emphysema. Left upper lobe masslike architectural distortion is identified compatible with changes secondary to external beam radiation. Pulmonary lesion is partially obscured by presumed changes of external beam radiation. This measures 1.7 x 1.2 cm, image 33 of series 4. Previously 1.8 x 1.5 cm. The sub solid ground-glass nodule in the left upper lobe is stable measuring 2.1 x 1.4 cm. The sub solid lesion within the posterior right upper lobe measures 2.2 by 2.2 cm, image 53 of series 4. Previously 2.5 by 2.3 cm. Sub solid lesion within the lateral right upper lobe measures 2.5 by 2.0 cm, image 61 of series 4. Previously 3.2 x 2.1 cm. Musculoskeletal: Multi level degenerative disc disease is present within the thoracic spine. Multiple non acute posterior right rib fractures are again noted. Advanced changes of degenerative joint disease are noted within the right glenohumeral joint. Previous left shoulder arthroplasty. CT ABDOMEN PELVIS FINDINGS Hepatobiliary: The hypervascular liver lesion within the anterior right lobe is  stable from previous exam measuring 5 mm. Similar appearance of small low density foci within the liver. The largest is in segment 6 measuring 1.1 cm, image 75 of series 2. Unchanged from prior exam. Previous cholecystectomy. No biliary dilatation. Pancreas: Unremarkable. No pancreatic ductal dilatation or surrounding inflammatory changes. Spleen: Normal in size without focal abnormality. Adrenals/Urinary Tract: The left adrenal gland appears normal. Nodule in the right adrenal gland is stable measuring 1.3 cm, image 62 of series 2. Right renal cortical scarring. No kidney mass or hydronephrosis noted bilaterally. Moderate distension of the urinary bladder. Bladder wall irregularity/ trabeculation with bladder diverticuli again noted. Stomach/Bowel: Stomach is within normal limits. No evidence of bowel wall thickening, distention, or  inflammatory changes. Vascular/Lymphatic: Aortic atherosclerosis. No aneurysm. No upper abdominal adenopathy identified. No pelvic or inguinal adenopathy. Reproductive: Prostate is unremarkable. Other: No abdominal wall hernia or abnormality. No abdominopelvic ascites. Musculoskeletal: Degenerative disc disease throughout the lumbar spine is identified. Lucent lesion within the right iliac bone is unchanged measuring 11 mm, image 100 of series 2. IMPRESSION: 1. Increase in masslike architectural distortion within the medial left upper lobe partially obscuring the index lesion. These changes are presumably related to external beam radiation. The underlying lung lesion appears slightly decreased in size in the interval. 2. No change in areas of ground-glass attenuation in both lungs. Continued attention on follow-up imaging. 3. Aortic Atherosclerosis (ICD10-I70.0) and Emphysema (ICD10-J43.9). Coronary artery calcifications noted. 4. Changes in the bladder wall suggestive of chronic bladder outlet obstruction. Electronically Signed   By: Kerby Moors M.D.   On: 05/09/2017 15:47   Ct  Abdomen Pelvis W Contrast  Result Date: 05/09/2017 CLINICAL DATA:  Followup lung cancer. EXAM: CT CHEST, ABDOMEN, AND PELVIS WITH CONTRAST TECHNIQUE: Multidetector CT imaging of the chest, abdomen and pelvis was performed following the standard protocol during bolus administration of intravenous contrast. CONTRAST:  171m ISOVUE-300 IOPAMIDOL (ISOVUE-300) INJECTION 61% COMPARISON:  03/01/2017 FINDINGS: CT CHEST FINDINGS Cardiovascular: Normal heart size. There is a trace pericardial effusion which appears similar to previous exam. Aortic atherosclerosis. Calcification within the RCA and LAD coronary artery noted. Mediastinum/Nodes: The trachea appears patent and is midline. Normal appearance of the esophagus. No enlarged mediastinal or hilar adenopathy. No axillary or supraclavicular adenopathy. Lungs/Pleura: Moderate to advanced changes of centrilobular and paraseptal emphysema. Left upper lobe masslike architectural distortion is identified compatible with changes secondary to external beam radiation. Pulmonary lesion is partially obscured by presumed changes of external beam radiation. This measures 1.7 x 1.2 cm, image 33 of series 4. Previously 1.8 x 1.5 cm. The sub solid ground-glass nodule in the left upper lobe is stable measuring 2.1 x 1.4 cm. The sub solid lesion within the posterior right upper lobe measures 2.2 by 2.2 cm, image 53 of series 4. Previously 2.5 by 2.3 cm. Sub solid lesion within the lateral right upper lobe measures 2.5 by 2.0 cm, image 61 of series 4. Previously 3.2 x 2.1 cm. Musculoskeletal: Multi level degenerative disc disease is present within the thoracic spine. Multiple non acute posterior right rib fractures are again noted. Advanced changes of degenerative joint disease are noted within the right glenohumeral joint. Previous left shoulder arthroplasty. CT ABDOMEN PELVIS FINDINGS Hepatobiliary: The hypervascular liver lesion within the anterior right lobe is stable from previous  exam measuring 5 mm. Similar appearance of small low density foci within the liver. The largest is in segment 6 measuring 1.1 cm, image 75 of series 2. Unchanged from prior exam. Previous cholecystectomy. No biliary dilatation. Pancreas: Unremarkable. No pancreatic ductal dilatation or surrounding inflammatory changes. Spleen: Normal in size without focal abnormality. Adrenals/Urinary Tract: The left adrenal gland appears normal. Nodule in the right adrenal gland is stable measuring 1.3 cm, image 62 of series 2. Right renal cortical scarring. No kidney mass or hydronephrosis noted bilaterally. Moderate distension of the urinary bladder. Bladder wall irregularity/ trabeculation with bladder diverticuli again noted. Stomach/Bowel: Stomach is within normal limits. No evidence of bowel wall thickening, distention, or inflammatory changes. Vascular/Lymphatic: Aortic atherosclerosis. No aneurysm. No upper abdominal adenopathy identified. No pelvic or inguinal adenopathy. Reproductive: Prostate is unremarkable. Other: No abdominal wall hernia or abnormality. No abdominopelvic ascites. Musculoskeletal: Degenerative disc disease throughout the lumbar spine is  identified. Lucent lesion within the right iliac bone is unchanged measuring 11 mm, image 100 of series 2. IMPRESSION: 1. Increase in masslike architectural distortion within the medial left upper lobe partially obscuring the index lesion. These changes are presumably related to external beam radiation. The underlying lung lesion appears slightly decreased in size in the interval. 2. No change in areas of ground-glass attenuation in both lungs. Continued attention on follow-up imaging. 3. Aortic Atherosclerosis (ICD10-I70.0) and Emphysema (ICD10-J43.9). Coronary artery calcifications noted. 4. Changes in the bladder wall suggestive of chronic bladder outlet obstruction. Electronically Signed   By: Kerby Moors M.D.   On: 05/09/2017 15:47    ASSESSMENT AND PLAN:   This is a very pleasant 76 years old white male with metastatic non-small cell lung cancer, adenocarcinoma and currently on treatment with second line immunotherapy with Nivolumab status post 56 cycles and has been tolerating his treatment well. I recommended for him to proceed with cycle #57 today as a scheduled. He will continue his treatment every 2 weeks but I will see him every 4 weeks for evaluation. He was advised to call immediately if he has any concerning symptoms in the interval. The patient voices understanding of current disease status and treatment options and is in agreement with the current care plan. All questions were answered. The patient knows to call the clinic with any problems, questions or concerns. We can certainly see the patient much sooner if necessary. I spent 10 minutes counseling the patient face to face. The total time spent in the appointment was 15 minutes. Disclaimer: This note was dictated with voice recognition software. Similar sounding words can inadvertently be transcribed and may not be corrected upon review.

## 2017-05-24 NOTE — Progress Notes (Signed)
Oncology Nurse Navigator Documentation  Oncology Nurse Navigator Flowsheets 05/24/2017  Navigator Location [facility]  Navigator Encounter Type Clinic/MDC/spoke with patient and wife today at [facility].  He has complaints of fatigue.  I encouraged him to become more active with daily routine.   Patient Visit Type MedOnc  Treatment Phase Treatment  Barriers/Navigation Needs Education  Education Other  Interventions Education  Acuity Level 1  Time Spent with Patient 15

## 2017-05-24 NOTE — Patient Instructions (Signed)
[facility] Discharge Instructions for Patients Receiving Chemotherapy  Today you received the following chemotherapy agents Opdivo.  To help prevent nausea and vomiting after your treatment, we encourage you to take your nausea medication as directed.   If you develop nausea and vomiting that is not controlled by your nausea medication, call the clinic.   BELOW ARE SYMPTOMS THAT SHOULD BE REPORTED IMMEDIATELY:  *FEVER GREATER THAN 100.5 F  *CHILLS WITH OR WITHOUT FEVER  NAUSEA AND VOMITING THAT IS NOT CONTROLLED WITH YOUR NAUSEA MEDICATION  *UNUSUAL SHORTNESS OF BREATH  *UNUSUAL BRUISING OR BLEEDING  TENDERNESS IN MOUTH AND THROAT WITH OR WITHOUT PRESENCE OF ULCERS  *URINARY PROBLEMS  *BOWEL PROBLEMS  UNUSUAL RASH Items with * indicate a potential emergency and should be followed up as soon as possible.  Feel free to call the clinic you have any questions or concerns. The clinic phone number is [phone].  Please show the CHEMO ALERT CARD at check-in to the Emergency Department and triage nurse.    

## 2017-05-24 NOTE — Telephone Encounter (Signed)
Scheduled appt per 6/20 los - Patient to get new schedule in the treatment area.

## 2017-06-02 DIAGNOSIS — H353221 Exudative age-related macular degeneration, left eye, with active choroidal neovascularization: Secondary | ICD-10-CM | POA: Diagnosis not present

## 2017-06-08 ENCOUNTER — Other Ambulatory Visit (HOSPITAL_BASED_OUTPATIENT_CLINIC_OR_DEPARTMENT_OTHER): Payer: Medicare Other

## 2017-06-08 ENCOUNTER — Other Ambulatory Visit: Payer: Self-pay | Admitting: Emergency Medicine

## 2017-06-08 ENCOUNTER — Ambulatory Visit (HOSPITAL_BASED_OUTPATIENT_CLINIC_OR_DEPARTMENT_OTHER): Payer: Medicare Other

## 2017-06-08 VITALS — BP 151/62 | HR 64 | Temp 98.1°F | Resp 17

## 2017-06-08 DIAGNOSIS — C3490 Malignant neoplasm of unspecified part of unspecified bronchus or lung: Secondary | ICD-10-CM

## 2017-06-08 DIAGNOSIS — C3431 Malignant neoplasm of lower lobe, right bronchus or lung: Secondary | ICD-10-CM | POA: Diagnosis present

## 2017-06-08 DIAGNOSIS — Z5112 Encounter for antineoplastic immunotherapy: Secondary | ICD-10-CM

## 2017-06-08 DIAGNOSIS — G893 Neoplasm related pain (acute) (chronic): Secondary | ICD-10-CM

## 2017-06-08 LAB — CBC WITH DIFFERENTIAL/PLATELET
BASO%: 0.4 % (ref 0.0–2.0)
Basophils Absolute: 0 10*3/uL (ref 0.0–0.1)
EOS ABS: 0.2 10*3/uL (ref 0.0–0.5)
EOS%: 4.2 % (ref 0.0–7.0)
HCT: 43.2 % (ref 38.4–49.9)
HEMOGLOBIN: 14.7 g/dL (ref 13.0–17.1)
LYMPH%: 17.3 % (ref 14.0–49.0)
MCH: 33.2 pg (ref 27.2–33.4)
MCHC: 34 g/dL (ref 32.0–36.0)
MCV: 97.8 fL (ref 79.3–98.0)
MONO#: 0.7 10*3/uL (ref 0.1–0.9)
MONO%: 13.1 % (ref 0.0–14.0)
NEUT%: 65 % (ref 39.0–75.0)
NEUTROS ABS: 3.4 10*3/uL (ref 1.5–6.5)
PLATELETS: 189 10*3/uL (ref 140–400)
RBC: 4.42 10*6/uL (ref 4.20–5.82)
RDW: 14.4 % (ref 11.0–14.6)
WBC: 5.3 10*3/uL (ref 4.0–10.3)
lymph#: 0.9 10*3/uL (ref 0.9–3.3)

## 2017-06-08 LAB — COMPREHENSIVE METABOLIC PANEL
ALT: 14 U/L (ref 0–55)
ANION GAP: 11 meq/L (ref 3–11)
AST: 23 U/L (ref 5–34)
Albumin: 4.1 g/dL (ref 3.5–5.0)
Alkaline Phosphatase: 106 U/L (ref 40–150)
BILIRUBIN TOTAL: 0.89 mg/dL (ref 0.20–1.20)
BUN: 17.2 mg/dL (ref 7.0–26.0)
CALCIUM: 10.1 mg/dL (ref 8.4–10.4)
CHLORIDE: 97 meq/L — AB (ref 98–109)
CO2: 28 meq/L (ref 22–29)
CREATININE: 0.9 mg/dL (ref 0.7–1.3)
EGFR: 82 mL/min/{1.73_m2} — AB (ref >90)
Glucose: 91 mg/dl (ref 70–140)
Potassium: 4.8 mEq/L (ref 3.5–5.1)
Sodium: 136 mEq/L (ref 136–145)
Total Protein: 6.6 g/dL (ref 6.4–8.3)

## 2017-06-08 MED ORDER — SODIUM CHLORIDE 0.9 % IV SOLN
Freq: Once | INTRAVENOUS | Status: AC
  Administered 2017-06-08: 14:00:00 via INTRAVENOUS

## 2017-06-08 MED ORDER — HYDROCODONE-ACETAMINOPHEN 5-325 MG PO TABS: 1.0000 | ORAL_TABLET | ORAL | 0 refills | Status: DC | PRN

## 2017-06-08 MED ORDER — SODIUM CHLORIDE 0.9 % IV SOLN
240.0000 mg | Freq: Once | INTRAVENOUS | Status: AC
  Administered 2017-06-08: 240 mg via INTRAVENOUS
  Filled 2017-06-08: qty 24

## 2017-06-08 NOTE — Patient Instructions (Signed)
[facility] Discharge Instructions for Patients Receiving Chemotherapy  Today you received the following chemotherapy agents:  nivolumab (Opdivo)  To help prevent nausea and vomiting after your treatment, we encourage you to take your nausea medication as prescribed.   If you develop nausea and vomiting that is not controlled by your nausea medication, call the clinic.   BELOW ARE SYMPTOMS THAT SHOULD BE REPORTED IMMEDIATELY:  *FEVER GREATER THAN 100.5 F  *CHILLS WITH OR WITHOUT FEVER  NAUSEA AND VOMITING THAT IS NOT CONTROLLED WITH YOUR NAUSEA MEDICATION  *UNUSUAL SHORTNESS OF BREATH  *UNUSUAL BRUISING OR BLEEDING  TENDERNESS IN MOUTH AND THROAT WITH OR WITHOUT PRESENCE OF ULCERS  *URINARY PROBLEMS  *BOWEL PROBLEMS  UNUSUAL RASH Items with * indicate a potential emergency and should be followed up as soon as possible.  Feel free to call the clinic you have any questions or concerns. The clinic phone number is [phone].  Please show the [place] at check-in to the Emergency Department and triage nurse.

## 2017-06-13 ENCOUNTER — Other Ambulatory Visit: Payer: Self-pay | Admitting: Medical Oncology

## 2017-06-21 ENCOUNTER — Ambulatory Visit (HOSPITAL_BASED_OUTPATIENT_CLINIC_OR_DEPARTMENT_OTHER): Payer: Medicare Other

## 2017-06-21 ENCOUNTER — Ambulatory Visit (HOSPITAL_BASED_OUTPATIENT_CLINIC_OR_DEPARTMENT_OTHER): Payer: Medicare Other | Admitting: Internal Medicine

## 2017-06-21 ENCOUNTER — Encounter: Payer: Self-pay | Admitting: Internal Medicine

## 2017-06-21 ENCOUNTER — Other Ambulatory Visit (HOSPITAL_BASED_OUTPATIENT_CLINIC_OR_DEPARTMENT_OTHER): Payer: Medicare Other

## 2017-06-21 VITALS — BP 163/65 | HR 66 | Temp 97.8°F | Resp 18 | Ht 71.0 in | Wt 153.4 lb

## 2017-06-21 DIAGNOSIS — C3431 Malignant neoplasm of lower lobe, right bronchus or lung: Secondary | ICD-10-CM

## 2017-06-21 DIAGNOSIS — Z5112 Encounter for antineoplastic immunotherapy: Secondary | ICD-10-CM

## 2017-06-21 DIAGNOSIS — I10 Essential (primary) hypertension: Secondary | ICD-10-CM

## 2017-06-21 LAB — CBC WITH DIFFERENTIAL/PLATELET
BASO%: 0.5 % (ref 0.0–2.0)
BASOS ABS: 0 10*3/uL (ref 0.0–0.1)
EOS%: 3.9 % (ref 0.0–7.0)
Eosinophils Absolute: 0.2 10*3/uL (ref 0.0–0.5)
HEMATOCRIT: 44.6 % (ref 38.4–49.9)
HEMOGLOBIN: 15.4 g/dL (ref 13.0–17.1)
LYMPH#: 1 10*3/uL (ref 0.9–3.3)
LYMPH%: 18.9 % (ref 14.0–49.0)
MCH: 33.4 pg (ref 27.2–33.4)
MCHC: 34.4 g/dL (ref 32.0–36.0)
MCV: 97.1 fL (ref 79.3–98.0)
MONO#: 0.7 10*3/uL (ref 0.1–0.9)
MONO%: 12.9 % (ref 0.0–14.0)
NEUT%: 63.8 % (ref 39.0–75.0)
NEUTROS ABS: 3.4 10*3/uL (ref 1.5–6.5)
Platelets: 214 10*3/uL (ref 140–400)
RBC: 4.59 10*6/uL (ref 4.20–5.82)
RDW: 14.5 % (ref 11.0–14.6)
WBC: 5.3 10*3/uL (ref 4.0–10.3)

## 2017-06-21 LAB — COMPREHENSIVE METABOLIC PANEL
ALBUMIN: 4 g/dL (ref 3.5–5.0)
ALK PHOS: 115 U/L (ref 40–150)
ALT: 13 U/L (ref 0–55)
AST: 22 U/L (ref 5–34)
Anion Gap: 11 mEq/L (ref 3–11)
BILIRUBIN TOTAL: 0.92 mg/dL (ref 0.20–1.20)
BUN: 16.5 mg/dL (ref 7.0–26.0)
CALCIUM: 10 mg/dL (ref 8.4–10.4)
CO2: 27 mEq/L (ref 22–29)
CREATININE: 0.9 mg/dL (ref 0.7–1.3)
Chloride: 98 mEq/L (ref 98–109)
EGFR: 84 mL/min/{1.73_m2} — ABNORMAL LOW (ref >90)
GLUCOSE: 96 mg/dL (ref 70–140)
Potassium: 4.2 mEq/L (ref 3.5–5.1)
SODIUM: 135 meq/L — AB (ref 136–145)
TOTAL PROTEIN: 6.5 g/dL (ref 6.4–8.3)

## 2017-06-21 MED ORDER — SODIUM CHLORIDE 0.9 % IV SOLN
Freq: Once | INTRAVENOUS | Status: AC
  Administered 2017-06-21: 14:00:00 via INTRAVENOUS

## 2017-06-21 MED ORDER — SODIUM CHLORIDE 0.9 % IV SOLN
240.0000 mg | Freq: Once | INTRAVENOUS | Status: AC
  Administered 2017-06-21: 240 mg via INTRAVENOUS
  Filled 2017-06-21: qty 24

## 2017-06-21 NOTE — Progress Notes (Signed)
[facility] [phone]   [phone]) [phone]  OFFICE PROGRESS NOTE  [name], MD [place] [address]  DIAGNOSIS: Stage IV (T1a, N0, M1b) non-small cell lung cancer, adenocarcinoma of the right lower lobe diagnosed in October 2014  Molecular profile: Negative forRET, ALK, BRAF, MET, EGFR.  Positive for ERBB2 A775T, [id number], KRAS G13E, MAP2K1 D67N (see full report).   PRIOR THERAPY:  1) Systemic chemotherapy with carboplatin for AUC of 5 and Alimta 500 mg/M2 every 3 weeks, status post 6 cycles with stable disease. First dose on 10/23/2013. 2) Maintenance systemic chemotherapy with single agent Alimta 500 mg/M2 every 3 weeks. Status post 8 cycles.  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 Sondra Come completed on 12/16/2014. 4) stereotactic radiotherapy to the enlarging right upper lobe lung nodule under the care of Dr. Sondra Come completed on 11/22/2016  CURRENT THERAPY: Immunotherapy with Nivolumab 240 mg IV every 2 weeks, status post 58 cycles. First cycle was given on 01/28/2015.  INTERVAL HISTORY: Anthony Curry. 76 y.o. male returns to the clinic today for follow-up visit accompanied by his wife. The patient is feeling fine today with no specific complaints. He continues to tolerate his treatment with Nivolumab fairly well. He denied having any chest pain, shortness of breath, cough or hemoptysis. He denied having any fever or chills. He has no weight loss or night sweats. He is here today for evaluation before starting cycle #59 of his treatment.   MEDICAL HISTORY: Past Medical History:  Diagnosis Date  . 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 Adrenal mass ([place])   . Alcoholism in remission ([place])   . Anemia of chronic disease   . 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 Anxiety   . BPH (benign prostatic hyperplasia)   . Cancer ([place])    small cell/lung  .  Complication of anesthesia 09/08/2015    JITTERY AFTER GALLBLADDER SURGERY  . GERD (gastroesophageal reflux disease)   . History of radiation therapy 11/14/16, 11/18/16, 11/22/16   Left upper lung: 54 Gy in 3 fractions  . Hypertension   . Hypertension 12/07/2016  . Neoplastic malignant related fatigue    IV tx. every 2 weeks last9-14-16 (Dr. Earlie Server), radiation last tx. 6 months  . 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 Persistent atrial fibrillation ([place])   . Pulmonary nodules   . Radiation 12/02/14-12/16/14   Left adrenal gland 30 Gy in 10 fractions    ALLERGIES:  has No Known Allergies.  MEDICATIONS:  Current Outpatient Prescriptions  Medication Sig Dispense Refill  . ALPRAZolam (XANAX) 0.25 MG tablet Take 1 tablet (0.25 mg total) by mouth 3 (three) times daily as needed for anxiety or sleep. And also at night for sleep. 90 tablet 0  . apixaban (ELIQUIS) 5 MG TABS tablet Take 5 mg by mouth 2 (two) times daily.    . Ascorbic Acid (VITAMIN C PO) Take 1,000 mg by mouth daily.     . B Complex-C (SUPER B COMPLEX PO) Take 1 each by mouth daily.     . calcium carbonate (OS-CAL) 600 MG TABS Take 600 mg by mouth daily with breakfast.    . demeclocycline (DECLOMYCIN) 150 MG tablet Take 150 mg by mouth 2 (two) times daily.    [name] diltiazem (CARDIZEM CD) 180 MG 24 hr capsule Take 1 capsule (180 mg total) by mouth daily at 12 noon. 90 capsule 3  .  furosemide (LASIX) 40 MG tablet Take 0.5 tablets (20 mg total) by mouth every other day.    [name] HYDROcodone-acetaminophen (NORCO/VICODIN) 5-325 MG tablet Take 1-2 tablets by mouth every 4 (four) hours as needed for moderate pain. 30 tablet 0  . KLOR-CON M20 20 MEQ tablet Take 20 mEq by mouth every morning.     . Lactobacillus (ACIDOPHILUS) CAPS capsule Take 1 capsule by mouth daily.    [name] MAGNESIUM PO Take 400 mg by mouth daily.     . Multiple Vitamin (MULTIVITAMIN WITH MINERALS) TABS Take 1 tablet by mouth daily.    . Nivolumab (OPDIVO IV) Inject as  directed every 2 weeks    . Nutritional Supplements (ENSURE COMPLETE PO) Take 1 Can by mouth 2 (two) times daily.    . Omega-3 Fatty Acids (FISH OIL PO) Take 1 tablet by mouth daily.    . pantoprazole (PROTONIX) 40 MG tablet Take 40 mg by mouth every morning.  8  . tamsulosin (FLOMAX) 0.4 MG CAPS capsule Take 0.4 mg by mouth daily at 12 noon.     . thiamine (VITAMIN B-1) 100 MG tablet Take 1 tablet (100 mg total) by mouth daily. 30 tablet 6  . vitamin E 400 UNIT capsule Take 400 Units by mouth daily.     No current facility-administered medications for this visit.    Facility-Administered Medications Ordered in Other Visits  Medication Dose Route Frequency Provider Last Rate Last Dose  . 0.9 %  sodium chloride infusion   Intravenous Once [name], MD        SURGICAL HISTORY:  Past Surgical History:  Procedure Laterality Date  . ANKLE ARTHRODESIS  1995   rt fx-hardware in  . CARDIOVERSION N/A 09/29/2014   Procedure: CARDIOVERSION;  Surgeon: Josue Hector, MD;  Location: [facility] ENDOSCOPY;  Service: Cardiovascular;  Laterality: N/A;  . CHOLECYSTECTOMY  09/08/2015    laproscopic   . CHOLECYSTECTOMY N/A 09/08/2015   Procedure: LAPAROSCOPIC CHOLECYSTECTOMY WITH ATTEMPTED INTRAOPERATIVE CHOLANGIOGRAM ;  Surgeon: Fanny Skates, MD;  Location: [facility];  Service: General;  Laterality: N/A;  . COLONOSCOPY    . ERCP N/A 09/02/2015   Procedure: ENDOSCOPIC RETROGRADE CHOLANGIOPANCREATOGRAPHY (ERCP);  Surgeon: Arta Silence, MD;  Location: [facility] ENDOSCOPY;  Service: Endoscopy;  Laterality: N/A;  . ERCP N/A 09/07/2015   Procedure: ENDOSCOPIC RETROGRADE CHOLANGIOPANCREATOGRAPHY (ERCP);  Surgeon: Clarene Essex, MD;  Location: [facility] ENDOSCOPY;  Service: Endoscopy;  Laterality: N/A;  . EXCISION / CURETTAGE BONE CYST FINGER  2005   rt thumb  . HEMORRHOID SURGERY    . Percutaneous Cholecytostomy tube     IVR insertion 06-13-15(Dr. Vernard Gambles)- 08-28-15 remains in place to drainage bag  . TONSILLECTOMY    . TOTAL  SHOULDER ARTHROPLASTY  02/03/2012   Procedure: TOTAL SHOULDER ARTHROPLASTY;  Surgeon: [name], MD;  Location: [facility];  Service: Orthopedics;  Laterality: Left;    REVIEW OF SYSTEMS:  A comprehensive review of systems was negative.   PHYSICAL EXAMINATION: General appearance: alert, cooperative and no distress Head: Normocephalic, without obvious abnormality, atraumatic Neck: no adenopathy, no JVD, supple, symmetrical, trachea midline and thyroid not enlarged, symmetric, no tenderness/mass/nodules Lymph nodes: Cervical, supraclavicular, and axillary nodes normal. Resp: clear to auscultation bilaterally Back: symmetric, no curvature. ROM normal. No CVA tenderness. Cardio: regular rate and rhythm, S1, S2 normal, no murmur, click, rub or gallop GI: soft, non-tender; bowel sounds normal; no masses,  no organomegaly Extremities: extremities normal, atraumatic, no cyanosis or edema  ECOG PERFORMANCE STATUS: 1 -  Symptomatic but completely ambulatory  Blood pressure (!) 163/65, pulse 66, temperature 97.8 F (36.6 C), temperature source Oral, resp. rate 18, height 5' 11"  (1.803 m), weight 153 lb 6.4 oz (69.6 kg), SpO2 98 %.  LABORATORY DATA: Lab Results  Component Value Date   WBC 5.3 06/21/2017   HGB 15.4 06/21/2017   HCT 44.6 06/21/2017   MCV 97.1 06/21/2017   PLT 214 06/21/2017      Chemistry      Component Value Date/Time   NA 135 (L) 06/21/2017 1135   K 4.2 06/21/2017 1135   CL 98 (L) 03/15/2017 1206   CO2 27 06/21/2017 1135   BUN 16.5 06/21/2017 1135   CREATININE 0.9 06/21/2017 1135      Component Value Date/Time   CALCIUM 10.0 06/21/2017 1135   ALKPHOS 115 06/21/2017 1135   AST 22 06/21/2017 1135   ALT 13 06/21/2017 1135   BILITOT 0.92 06/21/2017 1135       RADIOGRAPHIC STUDIES: No results found.  ASSESSMENT AND PLAN:  This is a very pleasant 76 years old white male with metastatic non-small cell lung cancer, adenocarcinoma and currently on  treatment with second line immunotherapy with Nivolumab status post 58 cycles and has been tolerating his treatment well. I recommended for the patient to proceed with cycle #59 today as scheduled. He is very anxious and would like to start the treatment every 4 weeks once it becomes available so he can enjoy some time on the beach and doesn't have to come every 2 weeks. I explained to the patient that we are in the process of building a care plan for the four weekly schedule. I would see him back for follow-up visit in 4 weeks for evaluation before starting cycle #61. For hypertension, I strongly encouraged the patient to decrease his salt intake in diet. He will also continue monitor his blood pressure closely at home. He was advised to call immediately if it has any concerning symptoms in the interval. The patient voices understanding of current disease status and treatment options and is in agreement with the current care plan. All questions were answered. The patient knows to call the clinic with any problems, questions or concerns. We can certainly see the patient much sooner if necessary. I spent 10 minutes counseling the patient face to face. The total time spent in the appointment was 15 minutes. Disclaimer: This note was dictated with voice recognition software. Similar sounding words can inadvertently be transcribed and may not be corrected upon review.

## 2017-06-21 NOTE — Patient Instructions (Signed)
[facility] Discharge Instructions for Patients Receiving Chemotherapy  Today you received the following chemotherapy agents:  Nivolumab.  To help prevent nausea and vomiting after your treatment, we encourage you to take your nausea medication as directed.   If you develop nausea and vomiting that is not controlled by your nausea medication, call the clinic.   BELOW ARE SYMPTOMS THAT SHOULD BE REPORTED IMMEDIATELY:  *FEVER GREATER THAN 100.5 F  *CHILLS WITH OR WITHOUT FEVER  NAUSEA AND VOMITING THAT IS NOT CONTROLLED WITH YOUR NAUSEA MEDICATION  *UNUSUAL SHORTNESS OF BREATH  *UNUSUAL BRUISING OR BLEEDING  TENDERNESS IN MOUTH AND THROAT WITH OR WITHOUT PRESENCE OF ULCERS  *URINARY PROBLEMS  *BOWEL PROBLEMS  UNUSUAL RASH Items with * indicate a potential emergency and should be followed up as soon as possible.  Feel free to call the clinic you have any questions or concerns. The clinic phone number is [phone].  Please show the CHEMO ALERT CARD at check-in to the Emergency Department and triage nurse.   

## 2017-07-04 ENCOUNTER — Other Ambulatory Visit: Payer: Self-pay | Admitting: Medical Oncology

## 2017-07-04 ENCOUNTER — Telehealth: Payer: Self-pay | Admitting: Medical Oncology

## 2017-07-04 DIAGNOSIS — G893 Neoplasm related pain (acute) (chronic): Secondary | ICD-10-CM

## 2017-07-04 DIAGNOSIS — C3490 Malignant neoplasm of unspecified part of unspecified bronchus or lung: Secondary | ICD-10-CM

## 2017-07-04 MED ORDER — ALPRAZOLAM 0.25 MG PO TABS: 0.2500 mg | ORAL_TABLET | Freq: Three times a day (TID) | ORAL | 0 refills | Status: DC | PRN

## 2017-07-04 NOTE — Telephone Encounter (Signed)
Called in refill to local pharmacy.

## 2017-07-05 ENCOUNTER — Other Ambulatory Visit (HOSPITAL_BASED_OUTPATIENT_CLINIC_OR_DEPARTMENT_OTHER): Payer: Medicare Other

## 2017-07-05 ENCOUNTER — Ambulatory Visit (HOSPITAL_BASED_OUTPATIENT_CLINIC_OR_DEPARTMENT_OTHER): Payer: Medicare Other

## 2017-07-05 VITALS — BP 163/72 | HR 76 | Temp 98.0°F | Resp 18

## 2017-07-05 DIAGNOSIS — Z5112 Encounter for antineoplastic immunotherapy: Secondary | ICD-10-CM | POA: Diagnosis present

## 2017-07-05 DIAGNOSIS — C3431 Malignant neoplasm of lower lobe, right bronchus or lung: Secondary | ICD-10-CM

## 2017-07-05 LAB — CBC WITH DIFFERENTIAL/PLATELET
BASO%: 0.4 % (ref 0.0–2.0)
BASOS ABS: 0 10*3/uL (ref 0.0–0.1)
EOS ABS: 0.1 10*3/uL (ref 0.0–0.5)
EOS%: 2.8 % (ref 0.0–7.0)
HEMATOCRIT: 42.7 % (ref 38.4–49.9)
HEMOGLOBIN: 14.8 g/dL (ref 13.0–17.1)
LYMPH#: 0.9 10*3/uL (ref 0.9–3.3)
LYMPH%: 18.9 % (ref 14.0–49.0)
MCH: 33.3 pg (ref 27.2–33.4)
MCHC: 34.7 g/dL (ref 32.0–36.0)
MCV: 96.2 fL (ref 79.3–98.0)
MONO#: 0.6 10*3/uL (ref 0.1–0.9)
MONO%: 12.3 % (ref 0.0–14.0)
NEUT#: 3.1 10*3/uL (ref 1.5–6.5)
NEUT%: 65.6 % (ref 39.0–75.0)
PLATELETS: 216 10*3/uL (ref 140–400)
RBC: 4.44 10*6/uL (ref 4.20–5.82)
RDW: 14.2 % (ref 11.0–14.6)
WBC: 4.7 10*3/uL (ref 4.0–10.3)

## 2017-07-05 LAB — COMPREHENSIVE METABOLIC PANEL
ALBUMIN: 4.1 g/dL (ref 3.5–5.0)
ALK PHOS: 113 U/L (ref 40–150)
ALT: 15 U/L (ref 0–55)
ANION GAP: 10 meq/L (ref 3–11)
AST: 24 U/L (ref 5–34)
BILIRUBIN TOTAL: 0.86 mg/dL (ref 0.20–1.20)
BUN: 17.6 mg/dL (ref 7.0–26.0)
CALCIUM: 10 mg/dL (ref 8.4–10.4)
CO2: 27 mEq/L (ref 22–29)
Chloride: 99 mEq/L (ref 98–109)
Creatinine: 0.9 mg/dL (ref 0.7–1.3)
EGFR: 81 mL/min/{1.73_m2} — AB (ref >90)
Glucose: 98 mg/dl (ref 70–140)
POTASSIUM: 4.3 meq/L (ref 3.5–5.1)
Sodium: 136 mEq/L (ref 136–145)
Total Protein: 6.6 g/dL (ref 6.4–8.3)

## 2017-07-05 MED ORDER — SODIUM CHLORIDE 0.9 % IV SOLN
240.0000 mg | Freq: Once | INTRAVENOUS | Status: AC
  Administered 2017-07-05: 240 mg via INTRAVENOUS
  Filled 2017-07-05: qty 24

## 2017-07-05 MED ORDER — SODIUM CHLORIDE 0.9 % IV SOLN
Freq: Once | INTRAVENOUS | Status: AC
  Administered 2017-07-05: 15:00:00 via INTRAVENOUS

## 2017-07-05 NOTE — Patient Instructions (Signed)
[facility] Discharge Instructions for Patients Receiving Chemotherapy  Today you received the following chemotherapy agents:  Nivolumab.  To help prevent nausea and vomiting after your treatment, we encourage you to take your nausea medication as directed.   If you develop nausea and vomiting that is not controlled by your nausea medication, call the clinic.   BELOW ARE SYMPTOMS THAT SHOULD BE REPORTED IMMEDIATELY:  *FEVER GREATER THAN 100.5 F  *CHILLS WITH OR WITHOUT FEVER  NAUSEA AND VOMITING THAT IS NOT CONTROLLED WITH YOUR NAUSEA MEDICATION  *UNUSUAL SHORTNESS OF BREATH  *UNUSUAL BRUISING OR BLEEDING  TENDERNESS IN MOUTH AND THROAT WITH OR WITHOUT PRESENCE OF ULCERS  *URINARY PROBLEMS  *BOWEL PROBLEMS  UNUSUAL RASH Items with * indicate a potential emergency and should be followed up as soon as possible.  Feel free to call the clinic you have any questions or concerns. The clinic phone number is [phone].  Please show the CHEMO ALERT CARD at check-in to the Emergency Department and triage nurse.   

## 2017-07-14 DIAGNOSIS — H353221 Exudative age-related macular degeneration, left eye, with active choroidal neovascularization: Secondary | ICD-10-CM | POA: Diagnosis not present

## 2017-07-19 ENCOUNTER — Other Ambulatory Visit (HOSPITAL_BASED_OUTPATIENT_CLINIC_OR_DEPARTMENT_OTHER): Payer: Medicare Other

## 2017-07-19 ENCOUNTER — Telehealth: Payer: Self-pay | Admitting: Internal Medicine

## 2017-07-19 ENCOUNTER — Ambulatory Visit (HOSPITAL_BASED_OUTPATIENT_CLINIC_OR_DEPARTMENT_OTHER): Payer: Medicare Other

## 2017-07-19 ENCOUNTER — Encounter: Payer: Self-pay | Admitting: Internal Medicine

## 2017-07-19 ENCOUNTER — Ambulatory Visit (HOSPITAL_BASED_OUTPATIENT_CLINIC_OR_DEPARTMENT_OTHER): Payer: Medicare Other | Admitting: Internal Medicine

## 2017-07-19 DIAGNOSIS — C3490 Malignant neoplasm of unspecified part of unspecified bronchus or lung: Secondary | ICD-10-CM

## 2017-07-19 DIAGNOSIS — G893 Neoplasm related pain (acute) (chronic): Secondary | ICD-10-CM

## 2017-07-19 DIAGNOSIS — C3431 Malignant neoplasm of lower lobe, right bronchus or lung: Secondary | ICD-10-CM

## 2017-07-19 DIAGNOSIS — R42 Dizziness and giddiness: Secondary | ICD-10-CM | POA: Diagnosis not present

## 2017-07-19 DIAGNOSIS — Z79899 Other long term (current) drug therapy: Secondary | ICD-10-CM | POA: Diagnosis not present

## 2017-07-19 DIAGNOSIS — Z5112 Encounter for antineoplastic immunotherapy: Secondary | ICD-10-CM | POA: Diagnosis present

## 2017-07-19 LAB — CBC WITH DIFFERENTIAL/PLATELET
BASO%: 0.4 % (ref 0.0–2.0)
BASOS ABS: 0 10*3/uL (ref 0.0–0.1)
EOS ABS: 0.3 10*3/uL (ref 0.0–0.5)
EOS%: 6.4 % (ref 0.0–7.0)
HCT: 41.3 % (ref 38.4–49.9)
HGB: 14.6 g/dL (ref 13.0–17.1)
LYMPH%: 20.4 % (ref 14.0–49.0)
MCH: 33.7 pg — ABNORMAL HIGH (ref 27.2–33.4)
MCHC: 35.4 g/dL (ref 32.0–36.0)
MCV: 95.4 fL (ref 79.3–98.0)
MONO#: 0.5 10*3/uL (ref 0.1–0.9)
MONO%: 11.9 % (ref 0.0–14.0)
NEUT%: 60.9 % (ref 39.0–75.0)
NEUTROS ABS: 2.8 10*3/uL (ref 1.5–6.5)
Platelets: 214 10*3/uL (ref 140–400)
RBC: 4.33 10*6/uL (ref 4.20–5.82)
RDW: 14 % (ref 11.0–14.6)
WBC: 4.6 10*3/uL (ref 4.0–10.3)
lymph#: 0.9 10*3/uL (ref 0.9–3.3)

## 2017-07-19 LAB — COMPREHENSIVE METABOLIC PANEL
ALT: 16 U/L (ref 0–55)
AST: 22 U/L (ref 5–34)
Albumin: 3.9 g/dL (ref 3.5–5.0)
Alkaline Phosphatase: 98 U/L (ref 40–150)
Anion Gap: 10 mEq/L (ref 3–11)
BILIRUBIN TOTAL: 0.84 mg/dL (ref 0.20–1.20)
BUN: 17.4 mg/dL (ref 7.0–26.0)
CHLORIDE: 100 meq/L (ref 98–109)
CO2: 26 meq/L (ref 22–29)
CREATININE: 0.9 mg/dL (ref 0.7–1.3)
Calcium: 9.9 mg/dL (ref 8.4–10.4)
EGFR: 77 mL/min/{1.73_m2} — ABNORMAL LOW (ref >90)
GLUCOSE: 90 mg/dL (ref 70–140)
Potassium: 4.4 mEq/L (ref 3.5–5.1)
SODIUM: 136 meq/L (ref 136–145)
TOTAL PROTEIN: 6.5 g/dL (ref 6.4–8.3)

## 2017-07-19 LAB — TSH: TSH: 5.839 m(IU)/L — ABNORMAL HIGH (ref 0.320–4.118)

## 2017-07-19 MED ORDER — SODIUM CHLORIDE 0.9 % IV SOLN
Freq: Once | INTRAVENOUS | Status: AC
  Administered 2017-07-19: 14:00:00 via INTRAVENOUS

## 2017-07-19 MED ORDER — HYDROCODONE-ACETAMINOPHEN 5-325 MG PO TABS: 1.0000 | ORAL_TABLET | ORAL | 0 refills | Status: DC | PRN

## 2017-07-19 MED ORDER — SODIUM CHLORIDE 0.9 % IV SOLN
480.0000 mg | Freq: Once | INTRAVENOUS | Status: AC
  Administered 2017-07-19: 480 mg via INTRAVENOUS
  Filled 2017-07-19: qty 48

## 2017-07-19 NOTE — Patient Instructions (Signed)
[facility] Discharge Instructions for Patients Receiving Chemotherapy  Today you received the following chemotherapy agents:  Nivolumab.  To help prevent nausea and vomiting after your treatment, we encourage you to take your nausea medication as directed.   If you develop nausea and vomiting that is not controlled by your nausea medication, call the clinic.   BELOW ARE SYMPTOMS THAT SHOULD BE REPORTED IMMEDIATELY:  *FEVER GREATER THAN 100.5 F  *CHILLS WITH OR WITHOUT FEVER  NAUSEA AND VOMITING THAT IS NOT CONTROLLED WITH YOUR NAUSEA MEDICATION  *UNUSUAL SHORTNESS OF BREATH  *UNUSUAL BRUISING OR BLEEDING  TENDERNESS IN MOUTH AND THROAT WITH OR WITHOUT PRESENCE OF ULCERS  *URINARY PROBLEMS  *BOWEL PROBLEMS  UNUSUAL RASH Items with * indicate a potential emergency and should be followed up as soon as possible.  Feel free to call the clinic you have any questions or concerns. The clinic phone number is [phone].  Please show the CHEMO ALERT CARD at check-in to the Emergency Department and triage nurse.   

## 2017-07-19 NOTE — Progress Notes (Signed)
Patient on plan of care prior to pathways. 

## 2017-07-19 NOTE — Progress Notes (Signed)
[facility] [phone]   [phone]  OFFICE PROGRESS NOTE  [name], MD [place] [address]  DIAGNOSIS: Stage IV (T1a, N0, M1b) non-small cell lung cancer, adenocarcinoma of the right lower lobe diagnosed in October 2014  Molecular profile: Negative forRET, ALK, BRAF, MET, EGFR.  Positive for ERBB2 A775T, [id number], KRAS G13E, MAP2K1 D67N (see full report).   PRIOR THERAPY:  1) Systemic chemotherapy with carboplatin for AUC of 5 and Alimta 500 mg/M2 every 3 weeks, status post 6 cycles with stable disease. First dose on 10/23/2013. 2) Maintenance systemic chemotherapy with single agent Alimta 500 mg/M2 every 3 weeks. Status post 8 cycles.  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 Sondra Come completed on 12/16/2014. 4) stereotactic radiotherapy to the enlarging right upper lobe lung nodule under the care of Dr. Sondra Come completed on 11/22/2016. 5) Immunotherapy with Nivolumab 240 mg IV every 2 weeks, status post 60 cycles. First cycle was given on 01/28/2015. Last dose 07/05/2017.  CURRENT THERAPY: Immunotherapy with Nivolumab 480 mg IV every 4 weeks. First dose 07/19/2017.  INTERVAL HISTORY: Anthony Curry. 76 y.o. male returns to the clinic today for follow-up visit accompanied by his wife. The patient is feeling well today with no specific complaints. He denied having any chest pain, shortness of breath, cough or hemoptysis. He continues to complain of dizzy spells and he was evaluated in the past by neurology. He was advised to adapt to it. He denied having any recent weight loss or night sweats. He has no nausea, vomiting, diarrhea or constipation. He is here today for evaluation before starting the next dose of his treatment with immunotherapy.    MEDICAL HISTORY: Past Medical History:  Diagnosis Date  . 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 Adrenal mass ([place])   .  Alcoholism in remission ([place])   . Anemia of chronic disease   . 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 Anxiety   . BPH (benign prostatic hyperplasia)   . Cancer ([place])    small cell/lung  . Complication of anesthesia 09/08/2015    JITTERY AFTER GALLBLADDER SURGERY  . GERD (gastroesophageal reflux disease)   . History of radiation therapy 11/14/16, 11/18/16, 11/22/16   Left upper lung: 54 Gy in 3 fractions  . Hypertension   . Hypertension 12/07/2016  . Neoplastic malignant related fatigue    IV tx. every 2 weeks last9-14-16 (Dr. Earlie Server), radiation last tx. 6 months  . 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 Persistent atrial fibrillation ([place])   . Pulmonary nodules   . Radiation 12/02/14-12/16/14   Left adrenal gland 30 Gy in 10 fractions    ALLERGIES:  has No Known Allergies.  MEDICATIONS:  Current Outpatient Prescriptions  Medication Sig Dispense Refill  . ALPRAZolam (XANAX) 0.25 MG tablet Take 1 tablet (0.25 mg total) by mouth 3 (three) times daily as needed for anxiety or sleep. And also at night for sleep. 90 tablet 0  . apixaban (ELIQUIS) 5 MG TABS tablet Take 5 mg by mouth 2 (two) times daily.    . Ascorbic Acid (VITAMIN C PO) Take 1,000 mg by mouth daily.     . B Complex-C (SUPER B COMPLEX PO) Take 1 each by mouth daily.     . calcium carbonate (OS-CAL) 600 MG TABS Take 600 mg by mouth daily with breakfast.    . demeclocycline (DECLOMYCIN) 150 MG tablet Take  150 mg by mouth 2 (two) times daily.    [name] diltiazem (CARDIZEM CD) 180 MG 24 hr capsule Take 1 capsule (180 mg total) by mouth daily at 12 noon. 90 capsule 3  . furosemide (LASIX) 40 MG tablet Take 0.5 tablets (20 mg total) by mouth every other day.    [name] HYDROcodone-acetaminophen (NORCO/VICODIN) 5-325 MG tablet Take 1-2 tablets by mouth every 4 (four) hours as needed for moderate pain. 30 tablet 0  . KLOR-CON M20 20 MEQ tablet Take 20 mEq by mouth every morning.     . Lactobacillus (ACIDOPHILUS) CAPS  capsule Take 1 capsule by mouth daily.    [name] MAGNESIUM PO Take 400 mg by mouth daily.     . Multiple Vitamin (MULTIVITAMIN WITH MINERALS) TABS Take 1 tablet by mouth daily.    . Nivolumab (OPDIVO IV) Inject as directed every 2 weeks    . Nutritional Supplements (ENSURE COMPLETE PO) Take 1 Can by mouth 2 (two) times daily.    . Omega-3 Fatty Acids (FISH OIL PO) Take 1 tablet by mouth daily.    . pantoprazole (PROTONIX) 40 MG tablet Take 40 mg by mouth every morning.  8  . tamsulosin (FLOMAX) 0.4 MG CAPS capsule Take 0.4 mg by mouth daily at 12 noon.     . thiamine (VITAMIN B-1) 100 MG tablet Take 1 tablet (100 mg total) by mouth daily. 30 tablet 6  . vitamin E 400 UNIT capsule Take 400 Units by mouth daily.     No current facility-administered medications for this visit.    Facility-Administered Medications Ordered in Other Visits  Medication Dose Route Frequency Provider Last Rate Last Dose  . 0.9 %  sodium chloride infusion   Intravenous Once [name], MD        SURGICAL HISTORY:  Past Surgical History:  Procedure Laterality Date  . ANKLE ARTHRODESIS  1995   rt fx-hardware in  . CARDIOVERSION N/A 09/29/2014   Procedure: CARDIOVERSION;  Surgeon: Josue Hector, MD;  Location: [facility] ENDOSCOPY;  Service: Cardiovascular;  Laterality: N/A;  . CHOLECYSTECTOMY  09/08/2015    laproscopic   . CHOLECYSTECTOMY N/A 09/08/2015   Procedure: LAPAROSCOPIC CHOLECYSTECTOMY WITH ATTEMPTED INTRAOPERATIVE CHOLANGIOGRAM ;  Surgeon: Fanny Skates, MD;  Location: [facility];  Service: General;  Laterality: N/A;  . COLONOSCOPY    . ERCP N/A 09/02/2015   Procedure: ENDOSCOPIC RETROGRADE CHOLANGIOPANCREATOGRAPHY (ERCP);  Surgeon: Arta Silence, MD;  Location: [facility]OS[facility];  Service: Endoscopy;  Laterality: N/A;  . ERCP N/A 09/07/2015   Procedure: ENDOSCOPIC RETROGRADE CHOLANGIOPANCREATOGRAPHY (ERCP);  Surgeon: Clarene Essex, MD;  Location: [facility] ENDOSCOPY;  Service: Endoscopy;  Laterality: N/A;  . EXCISION /  CURETTAGE BONE CYST FINGER  2005   rt thumb  . HEMORRHOID SURGERY    . Percutaneous Cholecytostomy tube     IVR insertion 06-13-15(Dr. Vernard Gambles)- 08-28-15 remains in place to drainage bag  . TONSILLECTOMY    . TOTAL SHOULDER ARTHROPLASTY  02/03/2012   Procedure: TOTAL SHOULDER ARTHROPLASTY;  Surgeon: [name], MD;  Location: [facility];  Service: Orthopedics;  Laterality: Left;    REVIEW OF SYSTEMS:  A comprehensive review of systems was negative except for: Neurological: positive for dizziness   PHYSICAL EXAMINATION: General appearance: alert, cooperative and no distress Head: Normocephalic, without obvious abnormality, atraumatic Neck: no adenopathy, no JVD, supple, symmetrical, trachea midline and thyroid not enlarged, symmetric, no tenderness/mass/nodules Lymph nodes: Cervical, supraclavicular, and axillary nodes normal. Resp: clear to auscultation bilaterally Back: symmetric, no curvature.  ROM normal. No CVA tenderness. Cardio: regular rate and rhythm, S1, S2 normal, no murmur, click, rub or gallop GI: soft, non-tender; bowel sounds normal; no masses,  no organomegaly Extremities: extremities normal, atraumatic, no cyanosis or edema  ECOG PERFORMANCE STATUS: 1 - Symptomatic but completely ambulatory  Blood pressure (!) 148/72, pulse 70, temperature 98.2 F (36.8 C), temperature source Oral, resp. rate 18, height 5' 11"  (1.803 m), weight 153 lb (69.4 kg), SpO2 97 %.  LABORATORY DATA: Lab Results  Component Value Date   WBC 4.6 07/19/2017   HGB 14.6 07/19/2017   HCT 41.3 07/19/2017   MCV 95.4 07/19/2017   PLT 214 07/19/2017      Chemistry      Component Value Date/Time   NA 136 07/19/2017 1131   K 4.4 07/19/2017 1131   CL 98 (L) 03/15/2017 1206   CO2 26 07/19/2017 1131   BUN 17.4 07/19/2017 1131   CREATININE 0.9 07/19/2017 1131      Component Value Date/Time   CALCIUM 9.9 07/19/2017 1131   ALKPHOS 98 07/19/2017 1131   AST 22 07/19/2017 1131   ALT  16 07/19/2017 1131   BILITOT 0.84 07/19/2017 1131       RADIOGRAPHIC STUDIES: No results found.  ASSESSMENT AND PLAN:  This is a very pleasant 76 years old white male with metastatic non-small cell lung cancer, adenocarcinoma and currently on treatment with second line immunotherapy with Nivolumab status post 60 cycles and has been tolerating his treatment well. I recommended for the patient to continue his treatment with Nivolumab as scheduled. He would like to space to the every 4 week dosing because he spent more time at the beach and he would like to come to the infusion center less frequently. I will change his treatment to a Nivolumab 480 mg IV every 4 weeks starting from today. I will see the patient back for follow-up visit in 4 weeks for evaluation after repeating CT scan of the chest, abdomen and pelvis for restaging of his disease. He was advised to call immediately if he has any concerning symptoms in the interval. The patient voices understanding of current disease status and treatment options and is in agreement with the current care plan. All questions were answered. The patient knows to call the clinic with any problems, questions or concerns. We can certainly see the patient much sooner if necessary. I spent 10 minutes counseling the patient face to face. The total time spent in the appointment was 15 minutes. Disclaimer: This note was dictated with voice recognition software. Similar sounding words can inadvertently be transcribed and may not be corrected upon review.

## 2017-07-19 NOTE — Telephone Encounter (Signed)
Gave pt avs and calendar for appts.

## 2017-07-21 ENCOUNTER — Telehealth: Payer: Self-pay

## 2017-07-21 ENCOUNTER — Other Ambulatory Visit: Payer: Self-pay | Admitting: Oncology

## 2017-07-21 DIAGNOSIS — C3490 Malignant neoplasm of unspecified part of unspecified bronchus or lung: Secondary | ICD-10-CM

## 2017-07-21 NOTE — Telephone Encounter (Signed)
Wife stated Dr [name] was going to order CT at [facility] 8/15. When she tried to schedule it there was no order.  She is stating she wants to schedule CT 9/11 in the am or 9/10 in the afternoon.   Taken to [employer] and she will look into this issue.

## 2017-07-25 NOTE — Telephone Encounter (Signed)
Prior auth received and central scheduling informed so they can call pt.

## 2017-08-02 ENCOUNTER — Ambulatory Visit: Payer: Medicare Other

## 2017-08-02 ENCOUNTER — Other Ambulatory Visit: Payer: Medicare Other

## 2017-08-14 ENCOUNTER — Telehealth: Payer: Self-pay | Admitting: Medical Oncology

## 2017-08-14 ENCOUNTER — Other Ambulatory Visit: Payer: Self-pay | Admitting: Medical Oncology

## 2017-08-14 DIAGNOSIS — G893 Neoplasm related pain (acute) (chronic): Secondary | ICD-10-CM

## 2017-08-14 DIAGNOSIS — C3490 Malignant neoplasm of unspecified part of unspecified bronchus or lung: Secondary | ICD-10-CM

## 2017-08-14 MED ORDER — ALPRAZOLAM 0.25 MG PO TABS: 0.2500 mg | ORAL_TABLET | Freq: Three times a day (TID) | ORAL | 0 refills | Status: DC | PRN

## 2017-08-14 NOTE — Telephone Encounter (Signed)
Called in xanax refill

## 2017-08-15 ENCOUNTER — Other Ambulatory Visit: Payer: Self-pay | Admitting: Medical Oncology

## 2017-08-15 ENCOUNTER — Encounter (HOSPITAL_COMMUNITY): Payer: Self-pay

## 2017-08-15 ENCOUNTER — Ambulatory Visit (HOSPITAL_COMMUNITY)
Admission: RE | Admit: 2017-08-15 | Discharge: 2017-08-15 | Disposition: A | Discharge status: Home / Self Care | Payer: Medicare Other | Source: Ambulatory Visit | Attending: Oncology | Admitting: Oncology

## 2017-08-15 DIAGNOSIS — C3490 Malignant neoplasm of unspecified part of unspecified bronchus or lung: Secondary | ICD-10-CM

## 2017-08-15 DIAGNOSIS — J439 Emphysema, unspecified: Secondary | ICD-10-CM | POA: Diagnosis not present

## 2017-08-15 DIAGNOSIS — I2510 Atherosclerotic heart disease of native coronary artery without angina pectoris: Secondary | ICD-10-CM | POA: Diagnosis not present

## 2017-08-15 DIAGNOSIS — H353221 Exudative age-related macular degeneration, left eye, with active choroidal neovascularization: Secondary | ICD-10-CM | POA: Diagnosis not present

## 2017-08-15 DIAGNOSIS — I700 Atherosclerosis of aorta: Secondary | ICD-10-CM | POA: Diagnosis not present

## 2017-08-15 MED ORDER — IOPAMIDOL (ISOVUE-300) INJECTION 61%
INTRAVENOUS | Status: AC
  Administered 2017-08-15: 100 mL via INTRAVENOUS
  Filled 2017-08-15: qty 100

## 2017-08-15 MED ORDER — IOPAMIDOL (ISOVUE-300) INJECTION 61%
100.0000 mL | Freq: Once | INTRAVENOUS | Status: AC | PRN
  Administered 2017-08-15: 100 mL via INTRAVENOUS

## 2017-08-16 ENCOUNTER — Encounter: Payer: Self-pay | Admitting: Internal Medicine

## 2017-08-16 ENCOUNTER — Ambulatory Visit (HOSPITAL_BASED_OUTPATIENT_CLINIC_OR_DEPARTMENT_OTHER): Payer: Medicare Other

## 2017-08-16 ENCOUNTER — Other Ambulatory Visit: Payer: Self-pay | Admitting: Medical Oncology

## 2017-08-16 ENCOUNTER — Other Ambulatory Visit (HOSPITAL_BASED_OUTPATIENT_CLINIC_OR_DEPARTMENT_OTHER): Payer: Medicare Other

## 2017-08-16 ENCOUNTER — Ambulatory Visit (HOSPITAL_BASED_OUTPATIENT_CLINIC_OR_DEPARTMENT_OTHER): Payer: Medicare Other | Admitting: Internal Medicine

## 2017-08-16 VITALS — BP 144/65 | HR 84 | Temp 97.6°F | Resp 18 | Ht 71.0 in | Wt 152.3 lb

## 2017-08-16 DIAGNOSIS — C3431 Malignant neoplasm of lower lobe, right bronchus or lung: Secondary | ICD-10-CM | POA: Diagnosis not present

## 2017-08-16 DIAGNOSIS — Z5112 Encounter for antineoplastic immunotherapy: Secondary | ICD-10-CM

## 2017-08-16 DIAGNOSIS — G893 Neoplasm related pain (acute) (chronic): Secondary | ICD-10-CM

## 2017-08-16 DIAGNOSIS — R52 Pain, unspecified: Secondary | ICD-10-CM

## 2017-08-16 DIAGNOSIS — C3490 Malignant neoplasm of unspecified part of unspecified bronchus or lung: Secondary | ICD-10-CM

## 2017-08-16 DIAGNOSIS — G620 Drug-induced polyneuropathy: Secondary | ICD-10-CM

## 2017-08-16 LAB — CBC WITH DIFFERENTIAL/PLATELET
BASO%: 0.4 % (ref 0.0–2.0)
Basophils Absolute: 0 10*3/uL (ref 0.0–0.1)
EOS%: 3.7 % (ref 0.0–7.0)
Eosinophils Absolute: 0.2 10*3/uL (ref 0.0–0.5)
HCT: 42.1 % (ref 38.4–49.9)
HEMOGLOBIN: 14.5 g/dL (ref 13.0–17.1)
LYMPH%: 22.5 % (ref 14.0–49.0)
MCH: 33.5 pg — AB (ref 27.2–33.4)
MCHC: 34.4 g/dL (ref 32.0–36.0)
MCV: 97.2 fL (ref 79.3–98.0)
MONO#: 0.5 10*3/uL (ref 0.1–0.9)
MONO%: 10.5 % (ref 0.0–14.0)
NEUT%: 62.9 % (ref 39.0–75.0)
NEUTROS ABS: 3 10*3/uL (ref 1.5–6.5)
Platelets: 215 10*3/uL (ref 140–400)
RBC: 4.33 10*6/uL (ref 4.20–5.82)
RDW: 13.9 % (ref 11.0–14.6)
WBC: 4.8 10*3/uL (ref 4.0–10.3)
lymph#: 1.1 10*3/uL (ref 0.9–3.3)

## 2017-08-16 LAB — COMPREHENSIVE METABOLIC PANEL
ALBUMIN: 3.7 g/dL (ref 3.5–5.0)
ALK PHOS: 103 U/L (ref 40–150)
ALT: 13 U/L (ref 0–55)
AST: 21 U/L (ref 5–34)
Anion Gap: 10 mEq/L (ref 3–11)
BUN: 16.6 mg/dL (ref 7.0–26.0)
CO2: 25 meq/L (ref 22–29)
Calcium: 9.5 mg/dL (ref 8.4–10.4)
Chloride: 101 mEq/L (ref 98–109)
Creatinine: 0.9 mg/dL (ref 0.7–1.3)
EGFR: 83 mL/min/{1.73_m2} — AB (ref >90)
GLUCOSE: 111 mg/dL (ref 70–140)
POTASSIUM: 4.2 meq/L (ref 3.5–5.1)
SODIUM: 135 meq/L — AB (ref 136–145)
TOTAL PROTEIN: 6.1 g/dL — AB (ref 6.4–8.3)
Total Bilirubin: 0.56 mg/dL (ref 0.20–1.20)

## 2017-08-16 MED ORDER — HYDROCODONE-ACETAMINOPHEN 5-325 MG PO TABS: 1.0000 | ORAL_TABLET | ORAL | 0 refills | Status: DC | PRN

## 2017-08-16 MED ORDER — SODIUM CHLORIDE 0.9 % IV SOLN
Freq: Once | INTRAVENOUS | Status: AC
  Administered 2017-08-16: 15:00:00 via INTRAVENOUS

## 2017-08-16 MED ORDER — SODIUM CHLORIDE 0.9 % IV SOLN
480.0000 mg | Freq: Once | INTRAVENOUS | Status: AC
  Administered 2017-08-16: 480 mg via INTRAVENOUS
  Filled 2017-08-16: qty 48

## 2017-08-16 NOTE — Progress Notes (Signed)
[place] [phone]   [phone]  OFFICE PROGRESS NOTE  [name], MD [place] [address]  DIAGNOSIS: Stage IV (T1a, N0, M1b) non-small cell lung cancer, adenocarcinoma of the right lower lobe diagnosed in October 2014  Molecular profile: Negative forRET, ALK, BRAF, MET, EGFR.  Positive for ERBB2 A775T, [id number], KRAS G13E, MAP2K1 D67N (see full report).   PRIOR THERAPY:  1) Systemic chemotherapy with carboplatin for AUC of 5 and Alimta 500 mg/M2 every 3 weeks, status post 6 cycles with stable disease. First dose on 10/23/2013. 2) Maintenance systemic chemotherapy with single agent Alimta 500 mg/M2 every 3 weeks. Status post 8 cycles.  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 Sondra Come completed on 12/16/2014. 4) stereotactic radiotherapy to the enlarging right upper lobe lung nodule under the care of Dr. Sondra Come completed on 11/22/2016. 5) Immunotherapy with Nivolumab 240 mg IV every 2 weeks, status post 60 cycles. First cycle was given on 01/28/2015. Last dose 07/05/2017.  CURRENT THERAPY: Immunotherapy with Nivolumab 480 mg IV every 4 weeks. First dose 07/19/2017. Status post one cycle.  INTERVAL HISTORY: Anthony Curry. 76 y.o. male returns to the clinic today for follow-up visit accompanied by his wife and daughter. The patient is feeling fine today with no specific complaints. The peripheral neuropathy has significantly improved after the patient start applying Deep Blue oil to his feet. He denied having any current chest pain, shortness of breath, cough or hemoptysis. He denied having any fever or chills. He has no nausea, vomiting, diarrhea or constipation. He has no significant weight loss or night sweats. The patient had repeat CT scan of the chest, abdomen and pelvis performed recently and he is here for evaluation and discussion of his scan  results.   MEDICAL HISTORY: Past Medical History:  Diagnosis Date  . 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[place])   . Adrenal mass ([place])   . Alcoholism in remission ([place])   . Anemia of chronic disease   . 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 Anxiety   . BPH (benign prostatic hyperplasia)   . Cancer ([place])    small cell/lung  . Complication of anesthesia 09/08/2015    JITTERY AFTER GALLBLADDER SURGERY  . GERD (gastroesophageal reflux disease)   . History of radiation therapy 11/14/16, 11/18/16, 11/22/16   Left upper lung: 54 Gy in 3 fractions  . Hypertension   . Hypertension 12/07/2016  . Neoplastic malignant related fatigue    IV tx. every 2 weeks last9-14-16 (Dr. Earlie Server), radiation last tx. 6 months  . 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 Persistent atrial fibrillation ([place])   . Pulmonary nodules   . Radiation 12/02/14-12/16/14   Left adrenal gland 30 Gy in 10 fractions    ALLERGIES:  has No Known Allergies.  MEDICATIONS:  Current Outpatient Prescriptions  Medication Sig Dispense Refill  . ALPRAZolam (XANAX) 0.25 MG tablet Take 1 tablet (0.25 mg total) by mouth 3 (three) times daily as needed for anxiety or sleep. And also at night for sleep. 90 tablet 0  . ALPRAZolam (XANAX) 0.25 MG tablet Take 1 tablet (0.25 mg total) by mouth 3 (three) times daily as needed for anxiety or sleep. And also at night for sleep. 90 tablet 0  . apixaban (ELIQUIS) 5 MG TABS tablet Take 5 mg by mouth 2 (two) times daily.    . Ascorbic Acid (VITAMIN C PO) Take 1,000  mg by mouth daily.     . B Complex-C (SUPER B COMPLEX PO) Take 1 each by mouth daily.     . calcium carbonate (OS-CAL) 600 MG TABS Take 600 mg by mouth daily with breakfast.    . demeclocycline (DECLOMYCIN) 150 MG tablet Take 150 mg by mouth 2 (two) times daily.    [name] diltiazem (CARDIZEM CD) 180 MG 24 hr capsule Take 1 capsule (180 mg total) by mouth daily at 12 noon. 90 capsule 3  . furosemide (LASIX) 40 MG tablet Take 0.5 tablets  (20 mg total) by mouth every other day.    [name] KLOR-CON M20 20 MEQ tablet Take 20 mEq by mouth every morning.     . Lactobacillus (ACIDOPHILUS) CAPS capsule Take 1 capsule by mouth daily.    [name] MAGNESIUM PO Take 400 mg by mouth daily.     . Multiple Vitamin (MULTIVITAMIN WITH MINERALS) TABS Take 1 tablet by mouth daily.    . Nivolumab (OPDIVO IV) Inject as directed every 2 weeks    . Nutritional Supplements (ENSURE COMPLETE PO) Take 1 Can by mouth 2 (two) times daily.    . Omega-3 Fatty Acids (FISH OIL PO) Take 1 tablet by mouth daily.    . pantoprazole (PROTONIX) 40 MG tablet Take 40 mg by mouth every morning.  8  . tamsulosin (FLOMAX) 0.4 MG CAPS capsule Take 0.4 mg by mouth daily at 12 noon.     . thiamine (VITAMIN B-1) 100 MG tablet Take 1 tablet (100 mg total) by mouth daily. 30 tablet 6  . vitamin E 400 UNIT capsule Take 400 Units by mouth daily.    [name] HYDROcodone-acetaminophen (NORCO/VICODIN) 5-325 MG tablet Take 1-2 tablets by mouth every 4 (four) hours as needed for moderate pain. 30 tablet 0   No current facility-administered medications for this visit.     SURGICAL HISTORY:  Past Surgical History:  Procedure Laterality Date  . ANKLE ARTHRODESIS  1995   rt fx-hardware in  . CARDIOVERSION N/A 09/29/2014   Procedure: CARDIOVERSION;  Surgeon: Josue Hector, MD;  Location: [facility] ENDOSCOPY;  Service: Cardiovascular;  Laterality: N/A;  . CHOLECYSTECTOMY  09/08/2015    laproscopic   . CHOLECYSTECTOMY N/A 09/08/2015   Procedure: LAPAROSCOPIC CHOLECYSTECTOMY WITH ATTEMPTED INTRAOPERATIVE CHOLANGIOGRAM ;  Surgeon: Fanny Skates, MD;  Location: [place];  Service: General;  Laterality: N/A;  . COLONOSCOPY    . ERCP N/A 09/02/2015   Procedure: ENDOSCOPIC RETROGRADE CHOLANGIOPANCREATOGRAPHY (ERCP);  Surgeon: Arta Silence, MD;  Location: [facility] ENDOSCOPY;  Service: Endoscopy;  Laterality: N/A;  . ERCP N/A 09/07/2015   Procedure: ENDOSCOPIC RETROGRADE CHOLANGIOPANCREATOGRAPHY (ERCP);  Surgeon: Clarene Essex, MD;  Location: [facility] ENDOSCOPY;  Service: Endoscopy;  Laterality: N/A;  . EXCISION / CURETTAGE BONE CYST FINGER  2005   rt thumb  . HEMORRHOID SURGERY    . Percutaneous Cholecytostomy tube     IVR insertion 06-13-15(Dr. Vernard Gambles)- 08-28-15 remains in place to drainage bag  . TONSILLECTOMY    . TOTAL SHOULDER ARTHROPLASTY  02/03/2012   Procedure: TOTAL SHOULDER ARTHROPLASTY;  Surgeon: [name], MD;  Location: [facility];  Service: Orthopedics;  Laterality: Left;    REVIEW OF SYSTEMS:  Constitutional: negative Eyes: negative Ears, nose, mouth, throat, and face: negative Respiratory: negative Cardiovascular: negative Gastrointestinal: negative Genitourinary:negative Integument/breast: negative Hematologic/lymphatic: negative Musculoskeletal:negative Neurological: negative Behavioral/Psych: negative Endocrine: negative Allergic/Immunologic: negative   PHYSICAL EXAMINATION: General appearance: alert, cooperative and no distress Head: Normocephalic, without obvious abnormality, atraumatic Neck: no  adenopathy, no JVD, supple, symmetrical, trachea midline and thyroid not enlarged, symmetric, no tenderness/mass/nodules Lymph nodes: Cervical, supraclavicular, and axillary nodes normal. Resp: clear to auscultation bilaterally Back: symmetric, no curvature. ROM normal. No CVA tenderness. Cardio: regular rate and rhythm, S1, S2 normal, no murmur, click, rub or gallop GI: soft, non-tender; bowel sounds normal; no masses,  no organomegaly Extremities: extremities normal, atraumatic, no cyanosis or edema Neurologic: Alert and oriented X 3, normal strength and tone. Normal symmetric reflexes. Normal coordination and gait  ECOG PERFORMANCE STATUS: 1 - Symptomatic but completely ambulatory  Blood pressure (!) 144/65, pulse 84, temperature 97.6 F (36.4 C), temperature source Oral, resp. rate 18, height 5' 11"  (1.803 m), weight 152 lb 4.8 oz (69.1 kg), SpO2 99 %.  LABORATORY  DATA: Lab Results  Component Value Date   WBC 4.8 08/16/2017   HGB 14.5 08/16/2017   HCT 42.1 08/16/2017   MCV 97.2 08/16/2017   PLT 215 08/16/2017      Chemistry      Component Value Date/Time   NA 136 07/19/2017 1131   K 4.4 07/19/2017 1131   CL 98 (L) 03/15/2017 1206   CO2 26 07/19/2017 1131   BUN 17.4 07/19/2017 1131   CREATININE 0.9 07/19/2017 1131      Component Value Date/Time   CALCIUM 9.9 07/19/2017 1131   ALKPHOS 98 07/19/2017 1131   AST 22 07/19/2017 1131   ALT 16 07/19/2017 1131   BILITOT 0.84 07/19/2017 1131       RADIOGRAPHIC STUDIES: Ct Chest W Contrast  Result Date: 08/15/2017 CLINICAL DATA:  Right-sided lung cancer. Chemotherapy completed 2016. Ongoing immunotherapy. EXAM: CT CHEST, ABDOMEN, AND PELVIS WITH CONTRAST TECHNIQUE: Multidetector CT imaging of the chest, abdomen and pelvis was performed following the standard protocol during bolus administration of intravenous contrast. CONTRAST:  100 cc of Isovue-300 COMPARISON:  05/09/2017 FINDINGS: CT CHEST FINDINGS Cardiovascular: Advanced aortic and branch vessel atherosclerosis. Ulcerative plaque in the descending thoracic aorta. Normal heart size with similar small pericardial effusion anteriorly. Multivessel coronary artery atherosclerosis. No central pulmonary embolism, on this non-dedicated study. Mediastinum/Nodes: No supraclavicular adenopathy. No mediastinal or hilar adenopathy. Lungs/Pleura: No pleural fluid. Moderate centrilobular and paraseptal emphysema. Right apical mixed attenuation lesion measures 1.9 cm on image 28/series 6 and is similar. Posterior right upper lobe area of ground-glass attenuation and septal thickening is similar, including at 2.1 x 2.3 cm on image 58/series 6. Similar vague ground-glass more inferiorly and laterally in the right upper lobe including on image 65/series 6. Right base scarring. Medial left apical consolidation and architectural distortion are again identified. These  obscure the previously described pulmonary nodule. More inferior left upper lobe ground-glass nodules, including at 2.2 cm on image 58/series 6. Similar. More mild left base scarring. Musculoskeletal: Similar moderate right-sided gynecomastia. Right rib defects are likely postsurgical. Degenerative or posttraumatic changes about the right glenohumeral joint. Left shoulder arthroplasty. CT ABDOMEN PELVIS FINDINGS Hepatobiliary: Scattered well-circumscribed low-density liver lesions are similar and likely cysts. 6 mm hyper dense high right liver lobe lesion is not significantly changed. Cholecystectomy, without biliary ductal dilatation. Pancreas: Normal, without mass or ductal dilatation. Spleen: Normal in size, without focal abnormality. Adrenals/Urinary Tract: Normal left adrenal gland. Right adrenal nodularity is similar at 13 mm. Right-sided renal cortical thinning. Normal left kidney, without hydronephrosis. Bladder irregularity and saccules again identified. Stomach/Bowel: Normal stomach, without wall thickening. Normal wall large and small bowel loops. Vascular/Lymphatic: Advanced aortic and branch vessel atherosclerosis. No abdominopelvic adenopathy. Reproductive: Normal prostate. Other: No  significant free fluid. No evidence of omental or peritoneal disease. Musculoskeletal: Osteopenia. Probable bone island in the left iliac. Lucent lesion in the right iliac is similar at 11 mm. Advanced lumbosacral spondylosis with mild S-shaped thoracolumbar spine curvature. IMPRESSION: 1. Similar appearance of the chest. Area of presumed radiation fibrosis in the medial left apex is similar. This obscures the previously detailed pulmonary nodule. 2.  Emphysema (ICD10-J43.9). 3. Multiple ground-glass and mixed attenuation nodules are similar, warranting followup attention. 4. Coronary artery atherosclerosis. Aortic Atherosclerosis (ICD10-I70.0). 5. No acute process or evidence of metastatic disease in the abdomen or  pelvis. 6. Findings likely of bladder outlet obstruction, similar. Electronically Signed   By: Abigail Miyamoto M.D.   On: 08/15/2017 15:04   Ct Abdomen Pelvis W Contrast  Result Date: 08/15/2017 CLINICAL DATA:  Right-sided lung cancer. Chemotherapy completed 2016. Ongoing immunotherapy. EXAM: CT CHEST, ABDOMEN, AND PELVIS WITH CONTRAST TECHNIQUE: Multidetector CT imaging of the chest, abdomen and pelvis was performed following the standard protocol during bolus administration of intravenous contrast. CONTRAST:  100 cc of Isovue-300 COMPARISON:  05/09/2017 FINDINGS: CT CHEST FINDINGS Cardiovascular: Advanced aortic and branch vessel atherosclerosis. Ulcerative plaque in the descending thoracic aorta. Normal heart size with similar small pericardial effusion anteriorly. Multivessel coronary artery atherosclerosis. No central pulmonary embolism, on this non-dedicated study. Mediastinum/Nodes: No supraclavicular adenopathy. No mediastinal or hilar adenopathy. Lungs/Pleura: No pleural fluid. Moderate centrilobular and paraseptal emphysema. Right apical mixed attenuation lesion measures 1.9 cm on image 28/series 6 and is similar. Posterior right upper lobe area of ground-glass attenuation and septal thickening is similar, including at 2.1 x 2.3 cm on image 58/series 6. Similar vague ground-glass more inferiorly and laterally in the right upper lobe including on image 65/series 6. Right base scarring. Medial left apical consolidation and architectural distortion are again identified. These obscure the previously described pulmonary nodule. More inferior left upper lobe ground-glass nodules, including at 2.2 cm on image 58/series 6. Similar. More mild left base scarring. Musculoskeletal: Similar moderate right-sided gynecomastia. Right rib defects are likely postsurgical. Degenerative or posttraumatic changes about the right glenohumeral joint. Left shoulder arthroplasty. CT ABDOMEN PELVIS FINDINGS Hepatobiliary: Scattered  well-circumscribed low-density liver lesions are similar and likely cysts. 6 mm hyper dense high right liver lobe lesion is not significantly changed. Cholecystectomy, without biliary ductal dilatation. Pancreas: Normal, without mass or ductal dilatation. Spleen: Normal in size, without focal abnormality. Adrenals/Urinary Tract: Normal left adrenal gland. Right adrenal nodularity is similar at 13 mm. Right-sided renal cortical thinning. Normal left kidney, without hydronephrosis. Bladder irregularity and saccules again identified. Stomach/Bowel: Normal stomach, without wall thickening. Normal wall large and small bowel loops. Vascular/Lymphatic: Advanced aortic and branch vessel atherosclerosis. No abdominopelvic adenopathy. Reproductive: Normal prostate. Other: No significant free fluid. No evidence of omental or peritoneal disease. Musculoskeletal: Osteopenia. Probable bone island in the left iliac. Lucent lesion in the right iliac is similar at 11 mm. Advanced lumbosacral spondylosis with mild S-shaped thoracolumbar spine curvature. IMPRESSION: 1. Similar appearance of the chest. Area of presumed radiation fibrosis in the medial left apex is similar. This obscures the previously detailed pulmonary nodule. 2.  Emphysema (ICD10-J43.9). 3. Multiple ground-glass and mixed attenuation nodules are similar, warranting followup attention. 4. Coronary artery atherosclerosis. Aortic Atherosclerosis (ICD10-I70.0). 5. No acute process or evidence of metastatic disease in the abdomen or pelvis. 6. Findings likely of bladder outlet obstruction, similar. Electronically Signed   By: Abigail Miyamoto M.D.   On: 08/15/2017 15:04    ASSESSMENT AND PLAN:  This is  a very pleasant 76 years old white male with metastatic non-small cell lung cancer, adenocarcinoma and currently on treatment with second line immunotherapy with Nivolumab status post 60 cycles and has been tolerating his treatment well. He was recently switched to  Nivolumab 480 MG IV every 4 weeks is status post 1 cycle. The patient continues to tolerate the treatment fairly well with no significant adverse effects. He had repeat CT scan of the chest, abdomen and pelvis performed recently. I personally and independently reviewed the scan and discuss the results with the patient and his family. His scan showed no evidence for disease progression. I recommended for the patient to continue his current treatment with Nivolumab and the patient was started cycle #2 today. For pain management I gave him a refill for hydrocodone. I will see him back for follow-up visit in 4 weeks with the start of cycle #3. The patient was advised to call immediately if he has any concerning symptoms in the interval. The patient voices understanding of current disease status and treatment options and is in agreement with the current care plan. All questions were answered. The patient knows to call the clinic with any problems, questions or concerns. We can certainly see the patient much sooner if necessary.  Disclaimer: This note was dictated with voice recognition software. Similar sounding words can inadvertently be transcribed and may not be corrected upon review.

## 2017-08-16 NOTE — Patient Instructions (Signed)
[facility] Discharge Instructions for Patients Receiving Chemotherapy  Today you received the following chemotherapy agents:  Nivolumab.  To help prevent nausea and vomiting after your treatment, we encourage you to take your nausea medication as directed.   If you develop nausea and vomiting that is not controlled by your nausea medication, call the clinic.   BELOW ARE SYMPTOMS THAT SHOULD BE REPORTED IMMEDIATELY:  *FEVER GREATER THAN 100.5 F  *CHILLS WITH OR WITHOUT FEVER  NAUSEA AND VOMITING THAT IS NOT CONTROLLED WITH YOUR NAUSEA MEDICATION  *UNUSUAL SHORTNESS OF BREATH  *UNUSUAL BRUISING OR BLEEDING  TENDERNESS IN MOUTH AND THROAT WITH OR WITHOUT PRESENCE OF ULCERS  *URINARY PROBLEMS  *BOWEL PROBLEMS  UNUSUAL RASH Items with * indicate a potential emergency and should be followed up as soon as possible.  Feel free to call the clinic you have any questions or concerns. The clinic phone number is [phone].  Please show the CHEMO ALERT CARD at check-in to the Emergency Department and triage nurse.   

## 2017-08-17 ENCOUNTER — Telehealth: Payer: Self-pay | Admitting: Internal Medicine

## 2017-08-17 NOTE — Telephone Encounter (Signed)
Scheduled appt per 9/12 los - patient to get new schedule next scheduled treatment 09/13/17 - scheduled appt per treatment plan.

## 2017-09-13 ENCOUNTER — Other Ambulatory Visit (HOSPITAL_BASED_OUTPATIENT_CLINIC_OR_DEPARTMENT_OTHER): Payer: Medicare Other

## 2017-09-13 ENCOUNTER — Other Ambulatory Visit: Payer: Self-pay | Admitting: Medical Oncology

## 2017-09-13 ENCOUNTER — Encounter: Payer: Self-pay | Admitting: Internal Medicine

## 2017-09-13 ENCOUNTER — Ambulatory Visit (HOSPITAL_BASED_OUTPATIENT_CLINIC_OR_DEPARTMENT_OTHER): Payer: Medicare Other | Admitting: Internal Medicine

## 2017-09-13 ENCOUNTER — Ambulatory Visit (HOSPITAL_BASED_OUTPATIENT_CLINIC_OR_DEPARTMENT_OTHER): Payer: Medicare Other

## 2017-09-13 DIAGNOSIS — R42 Dizziness and giddiness: Secondary | ICD-10-CM | POA: Diagnosis not present

## 2017-09-13 DIAGNOSIS — C3490 Malignant neoplasm of unspecified part of unspecified bronchus or lung: Secondary | ICD-10-CM

## 2017-09-13 DIAGNOSIS — F419 Anxiety disorder, unspecified: Secondary | ICD-10-CM | POA: Diagnosis not present

## 2017-09-13 DIAGNOSIS — Z23 Encounter for immunization: Secondary | ICD-10-CM

## 2017-09-13 DIAGNOSIS — Z5112 Encounter for antineoplastic immunotherapy: Secondary | ICD-10-CM | POA: Diagnosis present

## 2017-09-13 DIAGNOSIS — R5383 Other fatigue: Secondary | ICD-10-CM

## 2017-09-13 DIAGNOSIS — G4700 Insomnia, unspecified: Secondary | ICD-10-CM | POA: Diagnosis not present

## 2017-09-13 DIAGNOSIS — C3431 Malignant neoplasm of lower lobe, right bronchus or lung: Secondary | ICD-10-CM

## 2017-09-13 DIAGNOSIS — G893 Neoplasm related pain (acute) (chronic): Secondary | ICD-10-CM

## 2017-09-13 DIAGNOSIS — E039 Hypothyroidism, unspecified: Secondary | ICD-10-CM

## 2017-09-13 DIAGNOSIS — C3432 Malignant neoplasm of lower lobe, left bronchus or lung: Secondary | ICD-10-CM | POA: Diagnosis present

## 2017-09-13 LAB — COMPREHENSIVE METABOLIC PANEL
ALBUMIN: 4 g/dL (ref 3.5–5.0)
ALK PHOS: 99 U/L (ref 40–150)
ALT: 14 U/L (ref 0–55)
AST: 20 U/L (ref 5–34)
Anion Gap: 9 mEq/L (ref 3–11)
BUN: 20.8 mg/dL (ref 7.0–26.0)
CALCIUM: 9.6 mg/dL (ref 8.4–10.4)
CO2: 28 mEq/L (ref 22–29)
CREATININE: 0.8 mg/dL (ref 0.7–1.3)
Chloride: 100 mEq/L (ref 98–109)
EGFR: >60 mL/min/{1.73_m2} (ref >60)
GLUCOSE: 87 mg/dL (ref 70–140)
POTASSIUM: 4 meq/L (ref 3.5–5.1)
SODIUM: 137 meq/L (ref 136–145)
TOTAL PROTEIN: 6.6 g/dL (ref 6.4–8.3)
Total Bilirubin: 0.78 mg/dL (ref 0.20–1.20)

## 2017-09-13 LAB — CBC WITH DIFFERENTIAL/PLATELET
BASO%: 0.5 % (ref 0.0–2.0)
BASOS ABS: 0 10*3/uL (ref 0.0–0.1)
EOS%: 4.8 % (ref 0.0–7.0)
Eosinophils Absolute: 0.2 10*3/uL (ref 0.0–0.5)
HEMATOCRIT: 42.1 % (ref 38.4–49.9)
HEMOGLOBIN: 14.6 g/dL (ref 13.0–17.1)
LYMPH#: 1.2 10*3/uL (ref 0.9–3.3)
LYMPH%: 28 % (ref 14.0–49.0)
MCH: 33 pg (ref 27.2–33.4)
MCHC: 34.7 g/dL (ref 32.0–36.0)
MCV: 95.2 fL (ref 79.3–98.0)
MONO#: 0.6 10*3/uL (ref 0.1–0.9)
MONO%: 13.6 % (ref 0.0–14.0)
NEUT%: 53.1 % (ref 39.0–75.0)
NEUTROS ABS: 2.3 10*3/uL (ref 1.5–6.5)
Platelets: 215 10*3/uL (ref 140–400)
RBC: 4.42 10*6/uL (ref 4.20–5.82)
RDW: 14.1 % (ref 11.0–14.6)
WBC: 4.4 10*3/uL (ref 4.0–10.3)

## 2017-09-13 MED ORDER — SODIUM CHLORIDE 0.9 % IV SOLN
480.0000 mg | Freq: Once | INTRAVENOUS | Status: AC
  Administered 2017-09-13: 480 mg via INTRAVENOUS
  Filled 2017-09-13: qty 48

## 2017-09-13 MED ORDER — INFLUENZA VAC SPLIT HIGH-DOSE 0.5 ML IM SUSY
0.5000 mL | PREFILLED_SYRINGE | Freq: Once | INTRAMUSCULAR | Status: AC
Start: 2017-09-13 — End: 2017-09-13
  Administered 2017-09-13: 0.5 mL via INTRAMUSCULAR
  Filled 2017-09-13: qty 0.5

## 2017-09-13 MED ORDER — ALPRAZOLAM 0.25 MG PO TABS: 0.2500 mg | ORAL_TABLET | Freq: Two times a day (BID) | ORAL | 0 refills | Status: DC | PRN

## 2017-09-13 MED ORDER — LEVOTHYROXINE SODIUM 50 MCG PO TABS: 50.0000 ug | ORAL_TABLET | Freq: Every day | ORAL | 2 refills | Status: DC

## 2017-09-13 MED ORDER — SODIUM CHLORIDE 0.9 % IV SOLN
Freq: Once | INTRAVENOUS | Status: AC
  Administered 2017-09-13: 14:00:00 via INTRAVENOUS

## 2017-09-13 MED ORDER — HYDROCODONE-ACETAMINOPHEN 5-325 MG PO TABS: 1.0000 | ORAL_TABLET | ORAL | 0 refills | Status: DC | PRN

## 2017-09-13 NOTE — Patient Instructions (Signed)
[facility] Discharge Instructions for Patients Receiving Chemotherapy  Today you received the following chemotherapy agents:  Nivolumab.  To help prevent nausea and vomiting after your treatment, we encourage you to take your nausea medication as directed.   If you develop nausea and vomiting that is not controlled by your nausea medication, call the clinic.   BELOW ARE SYMPTOMS THAT SHOULD BE REPORTED IMMEDIATELY:  *FEVER GREATER THAN 100.5 F  *CHILLS WITH OR WITHOUT FEVER  NAUSEA AND VOMITING THAT IS NOT CONTROLLED WITH YOUR NAUSEA MEDICATION  *UNUSUAL SHORTNESS OF BREATH  *UNUSUAL BRUISING OR BLEEDING  TENDERNESS IN MOUTH AND THROAT WITH OR WITHOUT PRESENCE OF ULCERS  *URINARY PROBLEMS  *BOWEL PROBLEMS  UNUSUAL RASH Items with * indicate a potential emergency and should be followed up as soon as possible.  Feel free to call the clinic you have any questions or concerns. The clinic phone number is [phone].  Please show the CHEMO ALERT CARD at check-in to the Emergency Department and triage nurse.   

## 2017-09-13 NOTE — Progress Notes (Signed)
[place] [phone]   [phone]  OFFICE PROGRESS NOTE  [name], MD [place] [id number]  DIAGNOSIS: Stage IV (T1a, N0, M1b) non-small cell lung cancer, adenocarcinoma of the right lower lobe diagnosed in October 2014  Molecular profile: Negative forRET, ALK, BRAF, MET, EGFR.  Positive for ERBB2 A775T, [id number], KRAS G13E, MAP2K1 D67N (see full report).   PRIOR THERAPY:  1) Systemic chemotherapy with carboplatin for AUC of 5 and Alimta 500 mg/M2 every 3 weeks, status post 6 cycles with stable disease. First dose on 10/23/2013. 2) Maintenance systemic chemotherapy with single agent Alimta 500 mg/M2 every 3 weeks. Status post 8 cycles.  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 Sondra Come completed on 12/16/2014. 4) stereotactic radiotherapy to the enlarging right upper lobe lung nodule under the care of Dr. Sondra Come completed on 11/22/2016. 5) Immunotherapy with Nivolumab 240 mg IV every 2 weeks, status post 60 cycles. First cycle was given on 01/28/2015. Last dose 07/05/2017.  CURRENT THERAPY: Immunotherapy with Nivolumab 480 mg IV every 4 weeks. First dose 07/19/2017. Status post 2 cycles.  INTERVAL HISTORY: Anthony Curry. 76 y.o. male returns to the clinic today for follow-up visit accompanied by his wife. The patient has no complaints today except for the mild fatigue and occasional dizzy spells. He denied having any chest pain, shortness of breath, cough or hemoptysis. He denied having any fever or chills. He has no nausea, vomiting, diarrhea or constipation. He continues to have intermittent insomnia and he takes Xanax 2 tablets at nighttime for sleep. He is here today for evaluation before starting cycle #3 of his treatment.   MEDICAL HISTORY: Past Medical History:  Diagnosis Date  . 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[place])   . Adrenal mass ([place])   . Alcoholism in  remission ([place])   . Anemia of chronic disease   . 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 Anxiety   . BPH (benign prostatic hyperplasia)   . Cancer ([place])    small cell/lung  . Complication of anesthesia 09/08/2015    JITTERY AFTER GALLBLADDER SURGERY  . GERD (gastroesophageal reflux disease)   . History of radiation therapy 11/14/16, 11/18/16, 11/22/16   Left upper lung: 54 Gy in 3 fractions  . Hypertension   . Hypertension 12/07/2016  . Neoplastic malignant related fatigue    IV tx. every 2 weeks last9-14-16 (Dr. Earlie Server), radiation last tx. 6 months  . 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 Persistent atrial fibrillation ([place])   . Pulmonary nodules   . Radiation 12/02/14-12/16/14   Left adrenal gland 30 Gy in 10 fractions    ALLERGIES:  has No Known Allergies.  MEDICATIONS:  Current Outpatient Prescriptions  Medication Sig Dispense Refill  . ALPRAZolam (XANAX) 0.25 MG tablet Take 1 tablet (0.25 mg total) by mouth 3 (three) times daily as needed for anxiety or sleep. And also at night for sleep. 90 tablet 0  . apixaban (ELIQUIS) 5 MG TABS tablet Take 5 mg by mouth 2 (two) times daily.    . Ascorbic Acid (VITAMIN C PO) Take 1,000 mg by mouth daily.     . B Complex-C (SUPER B COMPLEX PO) Take 1 each by mouth daily.     . calcium carbonate (OS-CAL) 600 MG TABS Take 600 mg by mouth daily with breakfast.    . demeclocycline (DECLOMYCIN) 150 MG tablet Take 150 mg by mouth 2 (  two) times daily.    [name] diltiazem (CARDIZEM CD) 180 MG 24 hr capsule Take 1 capsule (180 mg total) by mouth daily at 12 noon. 90 capsule 3  . furosemide (LASIX) 40 MG tablet Take 0.5 tablets (20 mg total) by mouth every other day.    [name] HYDROcodone-acetaminophen (NORCO/VICODIN) 5-325 MG tablet Take 1-2 tablets by mouth every 4 (four) hours as needed for moderate pain. 30 tablet 0  . KLOR-CON M20 20 MEQ tablet Take 20 mEq by mouth every morning.     . Lactobacillus (ACIDOPHILUS) CAPS capsule Take 1  capsule by mouth daily.    [name] MAGNESIUM PO Take 400 mg by mouth daily.     . Multiple Vitamin (MULTIVITAMIN WITH MINERALS) TABS Take 1 tablet by mouth daily.    . Nivolumab (OPDIVO IV) Inject as directed every 2 weeks    . Nutritional Supplements (ENSURE COMPLETE PO) Take 1 Can by mouth 2 (two) times daily.    . Omega-3 Fatty Acids (FISH OIL PO) Take 1 tablet by mouth daily.    . pantoprazole (PROTONIX) 40 MG tablet Take 40 mg by mouth every morning.  8  . tamsulosin (FLOMAX) 0.4 MG CAPS capsule Take 0.4 mg by mouth daily at 12 noon.     . thiamine (VITAMIN B-1) 100 MG tablet Take 1 tablet (100 mg total) by mouth daily. 30 tablet 6  . vitamin E 400 UNIT capsule Take 400 Units by mouth daily.     No current facility-administered medications for this visit.     SURGICAL HISTORY:  Past Surgical History:  Procedure Laterality Date  . ANKLE ARTHRODESIS  1995   rt fx-hardware in  . CARDIOVERSION N/A 09/29/2014   Procedure: CARDIOVERSION;  Surgeon: Josue Hector, MD;  Location[facility] END[facility]COPY;  Service: Cardiovascular;  Laterality: N/A;  . CHOLECYSTECTOMY  09/08/2015    laproscopic   . CHOLECYSTECTOMY N/A 09/08/2015   Procedure: LAPAROSCOPIC CHOLECYSTECTOMY WITH ATTEMPTED INTRAOPERATIVE CHOLANGIOGRAM ;  Surgeon: Fanny Skates, MD;  Location: [place];  Service: General;  Laterality: N/A;  . COLONOSCOPY    . ERCP N/A 09/02/2015   Procedure: ENDOSCOPIC RETROGRADE CHOLANGIOPANCREATOGRAPHY (ERCP);  Surgeon: Arta Silence, MD;  Location: [facility] ENDOSCOPY;  Service: Endoscopy;  Laterality: N/A;  . ERCP N/A 09/07/2015   Procedure: ENDOSCOPIC RETROGRADE CHOLANGIOPANCREATOGRAPHY (ERCP);  Surgeon: Clarene Essex, MD;  Location: [facility] ENDOSCOPY;  Service: Endoscopy;  Laterality: N/A;  . EXCISION / CURETTAGE BONE CYST FINGER  2005   rt thumb  . HEMORRHOID SURGERY    . Percutaneous Cholecytostomy tube     IVR insertion 06-13-15(Dr. Vernard Gambles)- 08-28-15 remains in place to drainage bag  . TONSILLECTOMY    . TOTAL SHOULDER  ARTHROPLASTY  02/03/2012   Procedure: TOTAL SHOULDER ARTHROPLASTY;  Surgeon: [name], MD;  Location: [place];  Service: Orthopedics;  Laterality: Left;    REVIEW OF SYSTEMS:  Constitutional: positive for fatigue Eyes: negative Ears, nose, mouth, throat, and face: negative Respiratory: negative Cardiovascular: negative Gastrointestinal: negative Genitourinary:negative Integument/breast: negative Hematologic/lymphatic: negative Musculoskeletal:negative Neurological: positive for paresthesia Behavioral/Psych: positive for sleep disturbance Endocrine: negative Allergic/Immunologic: negative   PHYSICAL EXAMINATION: General appearance: alert, cooperative and no distress Head: Normocephalic, without obvious abnormality, atraumatic Neck: no adenopathy, no JVD, supple, symmetrical, trachea midline and thyroid not enlarged, symmetric, no tenderness/mass/nodules Lymph nodes: Cervical, supraclavicular, and axillary nodes normal. Resp: clear to auscultation bilaterally Back: symmetric, no curvature. ROM normal. No CVA tenderness. Cardio: regular rate and rhythm, S1, S2 normal, no murmur, click, rub  or gallop GI: soft, non-tender; bowel sounds normal; no masses,  no organomegaly Extremities: extremities normal, atraumatic, no cyanosis or edema Neurologic: Alert and oriented X 3, normal strength and tone. Normal symmetric reflexes. Normal coordination and gait  ECOG PERFORMANCE STATUS: 1 - Symptomatic but completely ambulatory  Blood pressure 138/78, pulse 76, temperature (!) 97.5 F (36.4 C), temperature source Oral, resp. rate 18, height 5' 11"  (1.803 m), weight 149 lb 1.6 oz (67.6 kg), SpO2 96 %.  LABORATORY DATA: Lab Results  Component Value Date   WBC 4.4 09/13/2017   HGB 14.6 09/13/2017   HCT 42.1 09/13/2017   MCV 95.2 09/13/2017   PLT 215 09/13/2017      Chemistry      Component Value Date/Time   NA 137 09/13/2017 1014   K 4.0 09/13/2017 1014   CL 98  (L) 03/15/2017 1206   CO2 28 09/13/2017 1014   BUN 20.8 09/13/2017 1014   CREATININE 0.8 09/13/2017 1014      Component Value Date/Time   CALCIUM 9.6 09/13/2017 1014   ALKPHOS 99 09/13/2017 1014   AST 20 09/13/2017 1014   ALT 14 09/13/2017 1014   BILITOT 0.78 09/13/2017 1014       RADIOGRAPHIC STUDIES: Ct Chest W Contrast  Result Date: 08/15/2017 CLINICAL DATA:  Right-sided lung cancer. Chemotherapy completed 2016. Ongoing immunotherapy. EXAM: CT CHEST, ABDOMEN, AND PELVIS WITH CONTRAST TECHNIQUE: Multidetector CT imaging of the chest, abdomen and pelvis was performed following the standard protocol during bolus administration of intravenous contrast. CONTRAST:  100 cc of Isovue-300 COMPARISON:  05/09/2017 FINDINGS: CT CHEST FINDINGS Cardiovascular: Advanced aortic and branch vessel atherosclerosis. Ulcerative plaque in the descending thoracic aorta. Normal heart size with similar small pericardial effusion anteriorly. Multivessel coronary artery atherosclerosis. No central pulmonary embolism, on this non-dedicated study. Mediastinum/Nodes: No supraclavicular adenopathy. No mediastinal or hilar adenopathy. Lungs/Pleura: No pleural fluid. Moderate centrilobular and paraseptal emphysema. Right apical mixed attenuation lesion measures 1.9 cm on image 28/series 6 and is similar. Posterior right upper lobe area of ground-glass attenuation and septal thickening is similar, including at 2.1 x 2.3 cm on image 58/series 6. Similar vague ground-glass more inferiorly and laterally in the right upper lobe including on image 65/series 6. Right base scarring. Medial left apical consolidation and architectural distortion are again identified. These obscure the previously described pulmonary nodule. More inferior left upper lobe ground-glass nodules, including at 2.2 cm on image 58/series 6. Similar. More mild left base scarring. Musculoskeletal: Similar moderate right-sided gynecomastia. Right rib defects are  likely postsurgical. Degenerative or posttraumatic changes about the right glenohumeral joint. Left shoulder arthroplasty. CT ABDOMEN PELVIS FINDINGS Hepatobiliary: Scattered well-circumscribed low-density liver lesions are similar and likely cysts. 6 mm hyper dense high right liver lobe lesion is not significantly changed. Cholecystectomy, without biliary ductal dilatation. Pancreas: Normal, without mass or ductal dilatation. Spleen: Normal in size, without focal abnormality. Adrenals/Urinary Tract: Normal left adrenal gland. Right adrenal nodularity is similar at 13 mm. Right-sided renal cortical thinning. Normal left kidney, without hydronephrosis. Bladder irregularity and saccules again identified. Stomach/Bowel: Normal stomach, without wall thickening. Normal wall large and small bowel loops. Vascular/Lymphatic: Advanced aortic and branch vessel atherosclerosis. No abdominopelvic adenopathy. Reproductive: Normal prostate. Other: No significant free fluid. No evidence of omental or peritoneal disease. Musculoskeletal: Osteopenia. Probable bone island in the left iliac. Lucent lesion in the right iliac is similar at 11 mm. Advanced lumbosacral spondylosis with mild S-shaped thoracolumbar spine curvature. IMPRESSION: 1. Similar appearance of the chest. Area of  presumed radiation fibrosis in the medial left apex is similar. This obscures the previously detailed pulmonary nodule. 2.  Emphysema (ICD10-J43.9). 3. Multiple ground-glass and mixed attenuation nodules are similar, warranting followup attention. 4. Coronary artery atherosclerosis. Aortic Atherosclerosis (ICD10-I70.0). 5. No acute process or evidence of metastatic disease in the abdomen or pelvis. 6. Findings likely of bladder outlet obstruction, similar. Electronically Signed   By: Abigail Miyamoto M.D.   On: 08/15/2017 15:04   Ct Abdomen Pelvis W Contrast  Result Date: 08/15/2017 CLINICAL DATA:  Right-sided lung cancer. Chemotherapy completed 2016.  Ongoing immunotherapy. EXAM: CT CHEST, ABDOMEN, AND PELVIS WITH CONTRAST TECHNIQUE: Multidetector CT imaging of the chest, abdomen and pelvis was performed following the standard protocol during bolus administration of intravenous contrast. CONTRAST:  100 cc of Isovue-300 COMPARISON:  05/09/2017 FINDINGS: CT CHEST FINDINGS Cardiovascular: Advanced aortic and branch vessel atherosclerosis. Ulcerative plaque in the descending thoracic aorta. Normal heart size with similar small pericardial effusion anteriorly. Multivessel coronary artery atherosclerosis. No central pulmonary embolism, on this non-dedicated study. Mediastinum/Nodes: No supraclavicular adenopathy. No mediastinal or hilar adenopathy. Lungs/Pleura: No pleural fluid. Moderate centrilobular and paraseptal emphysema. Right apical mixed attenuation lesion measures 1.9 cm on image 28/series 6 and is similar. Posterior right upper lobe area of ground-glass attenuation and septal thickening is similar, including at 2.1 x 2.3 cm on image 58/series 6. Similar vague ground-glass more inferiorly and laterally in the right upper lobe including on image 65/series 6. Right base scarring. Medial left apical consolidation and architectural distortion are again identified. These obscure the previously described pulmonary nodule. More inferior left upper lobe ground-glass nodules, including at 2.2 cm on image 58/series 6. Similar. More mild left base scarring. Musculoskeletal: Similar moderate right-sided gynecomastia. Right rib defects are likely postsurgical. Degenerative or posttraumatic changes about the right glenohumeral joint. Left shoulder arthroplasty. CT ABDOMEN PELVIS FINDINGS Hepatobiliary: Scattered well-circumscribed low-density liver lesions are similar and likely cysts. 6 mm hyper dense high right liver lobe lesion is not significantly changed. Cholecystectomy, without biliary ductal dilatation. Pancreas: Normal, without mass or ductal dilatation. Spleen:  Normal in size, without focal abnormality. Adrenals/Urinary Tract: Normal left adrenal gland. Right adrenal nodularity is similar at 13 mm. Right-sided renal cortical thinning. Normal left kidney, without hydronephrosis. Bladder irregularity and saccules again identified. Stomach/Bowel: Normal stomach, without wall thickening. Normal wall large and small bowel loops. Vascular/Lymphatic: Advanced aortic and branch vessel atherosclerosis. No abdominopelvic adenopathy. Reproductive: Normal prostate. Other: No significant free fluid. No evidence of omental or peritoneal disease. Musculoskeletal: Osteopenia. Probable bone island in the left iliac. Lucent lesion in the right iliac is similar at 11 mm. Advanced lumbosacral spondylosis with mild S-shaped thoracolumbar spine curvature. IMPRESSION: 1. Similar appearance of the chest. Area of presumed radiation fibrosis in the medial left apex is similar. This obscures the previously detailed pulmonary nodule. 2.  Emphysema (ICD10-J43.9). 3. Multiple ground-glass and mixed attenuation nodules are similar, warranting followup attention. 4. Coronary artery atherosclerosis. Aortic Atherosclerosis (ICD10-I70.0). 5. No acute process or evidence of metastatic disease in the abdomen or pelvis. 6. Findings likely of bladder outlet obstruction, similar. Electronically Signed   By: Abigail Miyamoto M.D.   On: 08/15/2017 15:04    ASSESSMENT AND PLAN:  This is a very pleasant 76 years old white male with metastatic non-small cell lung cancer, adenocarcinoma and currently on treatment with second line immunotherapy with Nivolumab status post 60 cycles and has been tolerating his treatment well. He was recently switched to Nivolumab 480 MG IV every 4 weeks  status post 2 cycles. The patient tolerated the last cycle of his treatment well. I recommended for him to proceed with cycle #3 today as scheduled. For the insomnia and anxiety, he was given a refill of Xanax. I also gave the patient  referral of his pain medication. 40 hypothyroidism, I started the patient on levothyroxine 50 g by mouth daily. He would come back for follow-up visit in 4 weeks for evaluation before starting cycle #4. He was advised to call immediately if he has any concerning symptoms in the interval. The patient voices understanding of current disease status and treatment options and is in agreement with the current care plan. All questions were answered. The patient knows to call the clinic with any problems, questions or concerns. We can certainly see the patient much sooner if necessary.  Disclaimer: This note was dictated with voice recognition software. Similar sounding words can inadvertently be transcribed and may not be corrected upon review.

## 2017-09-20 ENCOUNTER — Telehealth: Payer: Self-pay | Admitting: Internal Medicine

## 2017-09-20 NOTE — Telephone Encounter (Signed)
Spoke with patient's wife regarding appts added per 10/10 los

## 2017-09-26 DIAGNOSIS — H353221 Exudative age-related macular degeneration, left eye, with active choroidal neovascularization: Secondary | ICD-10-CM | POA: Diagnosis not present

## 2017-10-11 ENCOUNTER — Other Ambulatory Visit: Payer: Self-pay | Admitting: Medical Oncology

## 2017-10-11 ENCOUNTER — Other Ambulatory Visit (HOSPITAL_BASED_OUTPATIENT_CLINIC_OR_DEPARTMENT_OTHER): Payer: Medicare Other

## 2017-10-11 ENCOUNTER — Ambulatory Visit (HOSPITAL_BASED_OUTPATIENT_CLINIC_OR_DEPARTMENT_OTHER): Payer: Medicare Other | Admitting: Oncology

## 2017-10-11 ENCOUNTER — Encounter: Payer: Self-pay | Admitting: Medical Oncology

## 2017-10-11 ENCOUNTER — Encounter: Payer: Self-pay | Admitting: Oncology

## 2017-10-11 ENCOUNTER — Ambulatory Visit (HOSPITAL_BASED_OUTPATIENT_CLINIC_OR_DEPARTMENT_OTHER): Payer: Medicare Other

## 2017-10-11 VITALS — BP 153/63 | HR 88 | Temp 97.9°F | Resp 17 | Ht 71.0 in | Wt 152.6 lb

## 2017-10-11 DIAGNOSIS — G4700 Insomnia, unspecified: Secondary | ICD-10-CM | POA: Diagnosis not present

## 2017-10-11 DIAGNOSIS — C3431 Malignant neoplasm of lower lobe, right bronchus or lung: Secondary | ICD-10-CM

## 2017-10-11 DIAGNOSIS — R5382 Chronic fatigue, unspecified: Secondary | ICD-10-CM

## 2017-10-11 DIAGNOSIS — Z5112 Encounter for antineoplastic immunotherapy: Secondary | ICD-10-CM

## 2017-10-11 DIAGNOSIS — F419 Anxiety disorder, unspecified: Secondary | ICD-10-CM

## 2017-10-11 DIAGNOSIS — R5383 Other fatigue: Secondary | ICD-10-CM

## 2017-10-11 DIAGNOSIS — G893 Neoplasm related pain (acute) (chronic): Secondary | ICD-10-CM

## 2017-10-11 DIAGNOSIS — C3490 Malignant neoplasm of unspecified part of unspecified bronchus or lung: Secondary | ICD-10-CM

## 2017-10-11 DIAGNOSIS — R42 Dizziness and giddiness: Secondary | ICD-10-CM | POA: Diagnosis not present

## 2017-10-11 LAB — COMPREHENSIVE METABOLIC PANEL
ALT: 14 U/L (ref 0–55)
AST: 19 U/L (ref 5–34)
Albumin: 3.6 g/dL (ref 3.5–5.0)
Alkaline Phosphatase: 103 U/L (ref 40–150)
Anion Gap: 10 mEq/L (ref 3–11)
BUN: 22.9 mg/dL (ref 7.0–26.0)
CHLORIDE: 102 meq/L (ref 98–109)
CO2: 26 meq/L (ref 22–29)
CREATININE: 0.8 mg/dL (ref 0.7–1.3)
Calcium: 9.9 mg/dL (ref 8.4–10.4)
EGFR: >60 mL/min/{1.73_m2} (ref >60)
Glucose: 99 mg/dl (ref 70–140)
Potassium: 4.2 mEq/L (ref 3.5–5.1)
Sodium: 137 mEq/L (ref 136–145)
TOTAL PROTEIN: 6.3 g/dL — AB (ref 6.4–8.3)
Total Bilirubin: 0.57 mg/dL (ref 0.20–1.20)

## 2017-10-11 LAB — CBC WITH DIFFERENTIAL/PLATELET
BASO%: 0.4 % (ref 0.0–2.0)
Basophils Absolute: 0 10*3/uL (ref 0.0–0.1)
EOS%: 2.5 % (ref 0.0–7.0)
Eosinophils Absolute: 0.1 10*3/uL (ref 0.0–0.5)
HEMATOCRIT: 41.2 % (ref 38.4–49.9)
HEMOGLOBIN: 14.2 g/dL (ref 13.0–17.1)
LYMPH#: 0.8 10*3/uL — AB (ref 0.9–3.3)
LYMPH%: 14.4 % (ref 14.0–49.0)
MCH: 32.8 pg (ref 27.2–33.4)
MCHC: 34.5 g/dL (ref 32.0–36.0)
MCV: 95.2 fL (ref 79.3–98.0)
MONO#: 0.7 10*3/uL (ref 0.1–0.9)
MONO%: 11.6 % (ref 0.0–14.0)
NEUT%: 71.1 % (ref 39.0–75.0)
NEUTROS ABS: 4 10*3/uL (ref 1.5–6.5)
PLATELETS: 239 10*3/uL (ref 140–400)
RBC: 4.33 10*6/uL (ref 4.20–5.82)
RDW: 13.8 % (ref 11.0–14.6)
WBC: 5.7 10*3/uL (ref 4.0–10.3)

## 2017-10-11 MED ORDER — SODIUM CHLORIDE 0.9 % IV SOLN
Freq: Once | INTRAVENOUS | Status: AC
  Administered 2017-10-11: 15:00:00 via INTRAVENOUS

## 2017-10-11 MED ORDER — HYDROCODONE-ACETAMINOPHEN 5-325 MG PO TABS: 1.0000 | ORAL_TABLET | ORAL | 0 refills | Status: DC | PRN

## 2017-10-11 MED ORDER — SODIUM CHLORIDE 0.9 % IV SOLN
480.0000 mg | Freq: Once | INTRAVENOUS | Status: AC
  Administered 2017-10-11: 480 mg via INTRAVENOUS
  Filled 2017-10-11: qty 48

## 2017-10-11 NOTE — Progress Notes (Signed)
[facility] OFFICE PROGRESS NOTE  [name], MD [place] [id number]  DIAGNOSIS: Stage IV (T1a, N0, M1b) non-small cell lung cancer, adenocarcinoma of the right lower lobe diagnosed in October 2014  Molecular profile: Negative forRET, ALK, BRAF, MET, EGFR.  Positive for ERBB2 A775T, GBT5V761Y, KRAS G13E, MAP2K1 D67N (see full report).   PRIOR THERAPY: 1) Systemic chemotherapy with carboplatin for AUC of 5 and Alimta 500 mg/M2 every 3 weeks, status post 6 cycles with stable disease. First dose on 10/23/2013. 2) Maintenance systemic chemotherapy with single agent Alimta 500 mg/M2 every 3 weeks. Status post 8 cycles.  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 Sondra Come completed on 12/16/2014. 4) stereotactic radiotherapy to the enlarging right upper lobe lung nodule under the care of Dr. Sondra Come completed on 11/22/2016. 5) Immunotherapy with Nivolumab 240 mg IV every 2 weeks, status post 60 cycles. First cycle was given on 01/28/2015. Last dose 07/05/2017.  CURRENT THERAPY: Immunotherapy with Nivolumab 480 mg IV every 4 weeks. First dose 07/19/2017. Status post 3 cycles.  INTERVAL HISTORY: Anthony Curry. 76 y.o. male returns for routine follow-up visit accompanied by his wife. The patient has no complaints today except for the mild fatigue and occasional dizzy spells. He denied having any chest pain, shortness of breath, cough or hemoptysis. He denied having any fever or chills. He has no nausea, vomiting, diarrhea or constipation. He continues to have intermittent insomnia and he takes Xanax 2 tablets at nighttime for sleep. He is here today for evaluation before starting cycle #4 of his treatment.  MEDICAL HISTORY: Past Medical History:  Diagnosis Date  . 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 Adrenal mass ([place])   . Alcoholism in remission ([place])   . Anemia of chronic disease   . Antineoplastic  chemotherapy induced anemia(285.3)   . Anxiety   . BPH (benign prostatic hyperplasia)   . Cancer ([place])    small cell/lung  . Complication of anesthesia 09/08/2015    JITTERY AFTER GALLBLADDER SURGERY  . GERD (gastroesophageal reflux disease)   . History of radiation therapy 11/14/16, 11/18/16, 11/22/16   Left upper lung: 54 Gy in 3 fractions  . Hypertension   . Hypertension 12/07/2016  . Neoplastic malignant related fatigue    IV tx. every 2 weeks last9-14-16 (Dr. Earlie Server), radiation last tx. 6 months  . 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 Persistent atrial fibrillation ([place])   . Pulmonary nodules   . Radiation 12/02/14-12/16/14   Left adrenal gland 30 Gy in 10 fractions    ALLERGIES:  has No Known Allergies.  MEDICATIONS:  Current Outpatient Medications  Medication Sig Dispense Refill  . ALPRAZolam (XANAX) 0.25 MG tablet Take 1 tablet (0.25 mg total) by mouth 2 (two) times daily as needed for anxiety or sleep. And also at night for sleep. 60 tablet 0  . apixaban (ELIQUIS) 5 MG TABS tablet Take 5 mg by mouth 2 (two) times daily.    . Ascorbic Acid (VITAMIN C PO) Take 1,000 mg by mouth daily.     . B Complex-C (SUPER B COMPLEX PO) Take 1 each by mouth daily.     . calcium carbonate (OS-CAL) 600 MG TABS Take 600 mg by mouth daily with breakfast.    . demeclocycline (DECLOMYCIN) 150 MG tablet Take 150 mg by mouth 2 (two) times daily.    [name] diltiazem (CARDIZEM CD) 180 MG 24 hr capsule Take  1 capsule (180 mg total) by mouth daily at 12 noon. 90 capsule 3  . furosemide (LASIX) 40 MG tablet Take 0.5 tablets (20 mg total) by mouth every other day.    [name] HYDROcodone-acetaminophen (NORCO/VICODIN) 5-325 MG tablet Take 1-2 tablets every 4 (four) hours as needed by mouth for moderate pain. 30 tablet 0  . KLOR-CON M20 20 MEQ tablet Take 20 mEq by mouth every morning.     . Lactobacillus (ACIDOPHILUS) CAPS capsule Take 1 capsule by mouth daily.    [name] levothyroxine (SYNTHROID) 50 MCG tablet  Take 1 tablet (50 mcg total) by mouth daily before breakfast. 30 tablet 2  . MAGNESIUM PO Take 400 mg by mouth daily.     . Multiple Vitamin (MULTIVITAMIN WITH MINERALS) TABS Take 1 tablet by mouth daily.    . Nivolumab (OPDIVO IV) Inject as directed every 2 weeks    . Nutritional Supplements (ENSURE COMPLETE PO) Take 1 Can by mouth 2 (two) times daily.    . Omega-3 Fatty Acids (FISH OIL PO) Take 1 tablet by mouth daily.    . pantoprazole (PROTONIX) 40 MG tablet Take 40 mg by mouth every morning.  8  . tamsulosin (FLOMAX) 0.4 MG CAPS capsule Take 0.4 mg by mouth daily at 12 noon.     . thiamine (VITAMIN B-1) 100 MG tablet Take 1 tablet (100 mg total) by mouth daily. 30 tablet 6  . vitamin E 400 UNIT capsule Take 400 Units by mouth daily.     No current facility-administered medications for this visit.     SURGICAL HISTORY:  Past Surgical History:  Procedure Laterality Date  . ANKLE ARTHRODESIS  1995   rt fx-hardware in  . CHOLECYSTECTOMY  09/08/2015    laproscopic   . COLONOSCOPY    . EXCISION / CURETTAGE BONE CYST FINGER  2005   rt thumb  . HEMORRHOID SURGERY    . Percutaneous Cholecytostomy tube     IVR insertion 06-13-15(Dr. Vernard Gambles)- 08-28-15 remains in place to drainage bag  . TONSILLECTOMY      REVIEW OF SYSTEMS:   Review of Systems  Constitutional: Negative for appetite change, chills, fever and unexpected weight change. Positive for mild fatigue. HENT:   Negative for mouth sores, nosebleeds, sore throat and trouble swallowing.   Eyes: Negative for eye problems and icterus.  Respiratory: Negative for cough, hemoptysis, shortness of breath and wheezing.   Cardiovascular: Negative for chest pain and leg swelling.  Gastrointestinal: Negative for abdominal pain, constipation, diarrhea, nausea and vomiting.  Genitourinary: Negative for bladder incontinence, difficulty urinating, dysuria, frequency and hematuria.   Musculoskeletal: Negative for back pain, gait problem, neck pain  and neck stiffness.  Skin: Negative for itching and rash.  Neurological: Negative for extremity weakness, gait problem, headaches, light-headedness and seizures. Positive for intermittent dizziness. Hematological: Negative for adenopathy. Does not bruise/bleed easily.  Psychiatric/Behavioral: Negative for confusion, depression and sleep disturbance. The patient is not nervous/anxious.     PHYSICAL EXAMINATION:  Blood pressure (!) 153/63, pulse 88, temperature 97.9 F (36.6 C), temperature source Oral, resp. rate 17, height 5' 11"  (1.803 m), weight 152 lb 9.6 oz (69.2 kg), SpO2 95 %.  ECOG PERFORMANCE STATUS: 1 - Symptomatic but completely ambulatory  Physical Exam  Constitutional: Oriented to person, place, and time and well-developed, well-nourished, and in no distress. No distress.  HENT:  Head: Normocephalic and atraumatic.  Mouth/Throat: Oropharynx is clear and moist. No oropharyngeal exudate.  Eyes: Conjunctivae are normal. Right eye exhibits no  discharge. Left eye exhibits no discharge. No scleral icterus.  Neck: Normal range of motion. Neck supple.  Cardiovascular: Normal rate, regular rhythm, normal heart sounds and intact distal pulses.   Pulmonary/Chest: Effort normal and breath sounds normal. No respiratory distress. No wheezes. No rales.  Abdominal: Soft. Bowel sounds are normal. Exhibits no distension and no mass. There is no tenderness.  Musculoskeletal: Normal range of motion. Exhibits no edema.  Lymphadenopathy:    No cervical adenopathy.  Neurological: Alert and oriented to person, place, and time. Exhibits normal muscle tone. Gait normal. Coordination normal.  Skin: Skin is warm and dry. No rash noted. Not diaphoretic. No erythema. No pallor.  Psychiatric: Mood, memory and judgment normal.  Vitals reviewed.  LABORATORY DATA: Lab Results  Component Value Date   WBC 5.7 10/11/2017   HGB 14.2 10/11/2017   HCT 41.2 10/11/2017   MCV 95.2 10/11/2017   PLT 239  10/11/2017      Chemistry      Component Value Date/Time   NA 137 10/11/2017 1243   K 4.2 10/11/2017 1243   CL 98 (L) 03/15/2017 1206   CO2 26 10/11/2017 1243   BUN 22.9 10/11/2017 1243   CREATININE 0.8 10/11/2017 1243      Component Value Date/Time   CALCIUM 9.9 10/11/2017 1243   ALKPHOS 103 10/11/2017 1243   AST 19 10/11/2017 1243   ALT 14 10/11/2017 1243   BILITOT 0.57 10/11/2017 1243       RADIOGRAPHIC STUDIES:  No results found.   ASSESSMENT/PLAN:  Malignant neoplasm of lower lobe of right lung This is a very pleasant 76 year old white male with metastatic non-small cell lung cancer, adenocarcinoma and currently on treatment with second line immunotherapy with Nivolumab status post 60 cycles and has been tolerating his treatment well. He was recently switched to Nivolumab 480 MG IV every 4 weeks status post 3 cycles. The patient tolerated the last cycle of his treatment well. I recommended for him to proceed with cycle #4 today as scheduled. For the insomnia and anxiety, he will continue Xanax. I also gave the patient a refill of his pain medication.  He would come back for follow-up visit in 4 weeks for evaluation before starting cycle #5.  He will have a restaging CT scan of the chest, abdomen, pelvis prior to his next visit.  He was advised to call immediately if he has any concerning symptoms in the interval. The patient voices understanding of current disease status and treatment options and is in agreement with the current care plan. All questions were answered. The patient knows to call the clinic with any problems, questions or concerns. We can certainly see the patient much sooner if necessary.  Orders Placed This Encounter  Procedures  . CT ABDOMEN PELVIS W CONTRAST    Standing Status:   Future    Standing Expiration Date:   10/11/2018    Order Specific Question:   If indicated for the ordered procedure, I authorize the administration of contrast media per  Radiology protocol    Answer:   Yes    Order Specific Question:   Preferred imaging location?    Answer:   [facility]    Order Specific Question:   Radiology Contrast Protocol - do NOT remove file path    Answer:   [email or web address]    Order Specific Question:   Reason for Exam additional comments    Answer:   lung cancer. restaging.  . CT CHEST W  CONTRAST    Standing Status:   Future    Standing Expiration Date:   10/11/2018    Order Specific Question:   If indicated for the ordered procedure, I authorize the administration of contrast media per Radiology protocol    Answer:   Yes    Order Specific Question:   Preferred imaging location?    Answer:   [facility]    Order Specific Question:   Radiology Contrast Protocol - do NOT remove file path    Answer:   [email or web address]    Order Specific Question:   Reason for Exam additional comments    Answer:   lung cancer. restaging.  . TSH    Standing Status:   Standing    Number of Occurrences:   8    Standing Expiration Date:   10/11/2018    [name], DNP, AGPCNP-BC, AOCNP 10/11/17

## 2017-10-11 NOTE — Patient Instructions (Signed)
Nivolumab injection What is this medicine? NIVOLUMAB (nye VOL ue mab) is a monoclonal antibody. It is used to treat melanoma, lung cancer, kidney cancer, head and neck cancer, Hodgkin lymphoma, urothelial cancer, colon cancer, and liver cancer. This medicine may be used for other purposes; ask your health care provider or pharmacist if you have questions. COMMON BRAND NAME(S): Opdivo What should I tell my health care provider before I take this medicine? They need to know if you have any of these conditions: -diabetes -immune system problems -kidney disease -liver disease -lung disease -organ transplant -stomach or intestine problems -thyroid disease -an unusual or allergic reaction to nivolumab, other medicines, foods, dyes, or preservatives -pregnant or trying to get pregnant -breast-feeding How should I use this medicine? This medicine is for infusion into a vein. It is given by a health care professional in a hospital or clinic setting. A special MedGuide will be given to you before each treatment. Be sure to read this information carefully each time. Talk to your pediatrician regarding the use of this medicine in children. While this drug may be prescribed for children as young as 12 years for selected conditions, precautions do apply. Overdosage: If you think you have taken too much of this medicine contact a poison control center or emergency room at once. NOTE: This medicine is only for you. Do not share this medicine with others. What if I miss a dose? It is important not to miss your dose. Call your doctor or health care professional if you are unable to keep an appointment. What may interact with this medicine? Interactions have not been studied. Give your health care provider a list of all the medicines, herbs, non-prescription drugs, or dietary supplements you use. Also tell them if you smoke, drink alcohol, or use illegal drugs. Some items may interact with your  medicine. This list may not describe all possible interactions. Give your health care provider a list of all the medicines, herbs, non-prescription drugs, or dietary supplements you use. Also tell them if you smoke, drink alcohol, or use illegal drugs. Some items may interact with your medicine. What should I watch for while using this medicine? This drug may make you feel generally unwell. Continue your course of treatment even though you feel ill unless your doctor tells you to stop. You may need blood work done while you are taking this medicine. Do not become pregnant while taking this medicine or for 5 months after stopping it. Women should inform their doctor if they wish to become pregnant or think they might be pregnant. There is a potential for serious side effects to an unborn child. Talk to your health care professional or pharmacist for more information. Do not breast-feed an infant while taking this medicine. What side effects may I notice from receiving this medicine? Side effects that you should report to your doctor or health care professional as soon as possible: -allergic reactions like skin rash, itching or hives, swelling of the face, lips, or tongue -black, tarry stools -blood in the urine -bloody or watery diarrhea -changes in vision -change in sex drive -changes in emotions or moods -chest pain -confusion -cough -decreased appetite -diarrhea -facial flushing -feeling faint or lightheaded -fever, chills -hair loss -hallucination, loss of contact with reality -headache -irritable -joint pain -loss of memory -muscle pain -muscle weakness -seizures -shortness of breath -signs and symptoms of high blood sugar such as dizziness; dry mouth; dry skin; fruity breath; nausea; stomach pain; increased hunger or thirst; increased   urination -signs and symptoms of kidney injury like trouble passing urine or change in the amount of urine -signs and symptoms of liver injury  like dark yellow or brown urine; general ill feeling or flu-like symptoms; light-colored stools; loss of appetite; nausea; right upper belly pain; unusually weak or tired; yellowing of the eyes or skin -stiff neck -swelling of the ankles, feet, hands -weight gain Side effects that usually do not require medical attention (report to your doctor or health care professional if they continue or are bothersome): -bone pain -constipation -tiredness -vomiting This list may not describe all possible side effects. Call your doctor for medical advice about side effects. You may report side effects to FDA at 1-800-FDA-1088. Where should I keep my medicine? This drug is given in a hospital or clinic and will not be stored at home. NOTE: This sheet is a summary. It may not cover all possible information. If you have questions about this medicine, talk to your doctor, pharmacist, or health care provider.  2018 Elsevier/Gold Standard (2016-08-29 17:49:34)  

## 2017-10-11 NOTE — Assessment & Plan Note (Signed)
This is a very pleasant 76 year old white male with metastatic non-small cell lung cancer, adenocarcinoma and currently on treatment with second line immunotherapy with Nivolumab status post 60 cycles and has been tolerating his treatment well. He was recently switched to Nivolumab 480 MG IV every 4 weeks status post 3 cycles. The patient tolerated the last cycle of his treatment well. I recommended for him to proceed with cycle #4 today as scheduled. For the insomnia and anxiety, he will continue Xanax. I also gave the patient a refill of his pain medication.  He would come back for follow-up visit in 4 weeks for evaluation before starting cycle #5.  He will have a restaging CT scan of the chest, abdomen, pelvis prior to his next visit.  He was advised to call immediately if he has any concerning symptoms in the interval. The patient voices understanding of current disease status and treatment options and is in agreement with the current care plan. All questions were answered. The patient knows to call the clinic with any problems, questions or concerns. We can certainly see the patient much sooner if necessary.

## 2017-10-12 ENCOUNTER — Other Ambulatory Visit: Payer: Self-pay | Admitting: Internal Medicine

## 2017-10-12 DIAGNOSIS — C3490 Malignant neoplasm of unspecified part of unspecified bronchus or lung: Secondary | ICD-10-CM

## 2017-10-12 DIAGNOSIS — G893 Neoplasm related pain (acute) (chronic): Secondary | ICD-10-CM

## 2017-10-16 ENCOUNTER — Telehealth: Payer: Self-pay | Admitting: Internal Medicine

## 2017-10-16 NOTE — Telephone Encounter (Signed)
Patient already on schedule for lab/fu/tx q4w thru January. Central radiology will call re scan.

## 2017-11-06 ENCOUNTER — Ambulatory Visit (HOSPITAL_COMMUNITY)
Admission: RE | Admit: 2017-11-06 | Discharge: 2017-11-06 | Disposition: A | Discharge status: Home / Self Care | Payer: Medicare Other | Source: Ambulatory Visit | Attending: Oncology | Admitting: Oncology

## 2017-11-06 ENCOUNTER — Encounter (HOSPITAL_COMMUNITY): Payer: Self-pay

## 2017-11-06 DIAGNOSIS — I2510 Atherosclerotic heart disease of native coronary artery without angina pectoris: Secondary | ICD-10-CM | POA: Insufficient documentation

## 2017-11-06 DIAGNOSIS — J439 Emphysema, unspecified: Secondary | ICD-10-CM | POA: Insufficient documentation

## 2017-11-06 DIAGNOSIS — I700 Atherosclerosis of aorta: Secondary | ICD-10-CM | POA: Insufficient documentation

## 2017-11-06 DIAGNOSIS — C3431 Malignant neoplasm of lower lobe, right bronchus or lung: Secondary | ICD-10-CM | POA: Diagnosis not present

## 2017-11-06 DIAGNOSIS — K7689 Other specified diseases of liver: Secondary | ICD-10-CM | POA: Diagnosis not present

## 2017-11-06 MED ORDER — IOPAMIDOL (ISOVUE-300) INJECTION 61%
100.0000 mL | Freq: Once | INTRAVENOUS | Status: AC | PRN
  Administered 2017-11-06: 100 mL via INTRAVENOUS

## 2017-11-06 MED ORDER — IOPAMIDOL (ISOVUE-300) INJECTION 61%
INTRAVENOUS | Status: AC
  Administered 2017-11-06: 100 mL via INTRAVENOUS
  Filled 2017-11-06: qty 100

## 2017-11-08 ENCOUNTER — Other Ambulatory Visit (HOSPITAL_BASED_OUTPATIENT_CLINIC_OR_DEPARTMENT_OTHER): Payer: Medicare Other

## 2017-11-08 ENCOUNTER — Ambulatory Visit (HOSPITAL_BASED_OUTPATIENT_CLINIC_OR_DEPARTMENT_OTHER): Payer: Medicare Other

## 2017-11-08 ENCOUNTER — Ambulatory Visit (HOSPITAL_BASED_OUTPATIENT_CLINIC_OR_DEPARTMENT_OTHER): Payer: Medicare Other | Admitting: Internal Medicine

## 2017-11-08 ENCOUNTER — Other Ambulatory Visit: Payer: Self-pay | Admitting: Medical Oncology

## 2017-11-08 ENCOUNTER — Encounter: Payer: Self-pay | Admitting: Internal Medicine

## 2017-11-08 VITALS — BP 149/83 | HR 83 | Temp 97.8°F | Resp 18 | Ht 71.0 in | Wt 151.4 lb

## 2017-11-08 DIAGNOSIS — C3431 Malignant neoplasm of lower lobe, right bronchus or lung: Secondary | ICD-10-CM | POA: Diagnosis not present

## 2017-11-08 DIAGNOSIS — E039 Hypothyroidism, unspecified: Secondary | ICD-10-CM | POA: Diagnosis not present

## 2017-11-08 DIAGNOSIS — G4700 Insomnia, unspecified: Secondary | ICD-10-CM | POA: Diagnosis not present

## 2017-11-08 DIAGNOSIS — R5383 Other fatigue: Secondary | ICD-10-CM | POA: Diagnosis not present

## 2017-11-08 DIAGNOSIS — Z79899 Other long term (current) drug therapy: Secondary | ICD-10-CM | POA: Diagnosis not present

## 2017-11-08 DIAGNOSIS — R42 Dizziness and giddiness: Secondary | ICD-10-CM

## 2017-11-08 DIAGNOSIS — R918 Other nonspecific abnormal finding of lung field: Secondary | ICD-10-CM

## 2017-11-08 DIAGNOSIS — Z5112 Encounter for antineoplastic immunotherapy: Secondary | ICD-10-CM

## 2017-11-08 DIAGNOSIS — R5382 Chronic fatigue, unspecified: Secondary | ICD-10-CM

## 2017-11-08 LAB — CBC WITH DIFFERENTIAL/PLATELET
BASO%: 0.5 % (ref 0.0–2.0)
BASOS ABS: 0 10*3/uL (ref 0.0–0.1)
EOS%: 2.7 % (ref 0.0–7.0)
Eosinophils Absolute: 0.2 10*3/uL (ref 0.0–0.5)
HCT: 45.3 % (ref 38.4–49.9)
HGB: 15.3 g/dL (ref 13.0–17.1)
LYMPH#: 1 10*3/uL (ref 0.9–3.3)
LYMPH%: 15.1 % (ref 14.0–49.0)
MCH: 32.4 pg (ref 27.2–33.4)
MCHC: 33.8 g/dL (ref 32.0–36.0)
MCV: 95.9 fL (ref 79.3–98.0)
MONO#: 0.8 10*3/uL (ref 0.1–0.9)
MONO%: 12.1 % (ref 0.0–14.0)
NEUT#: 4.6 10*3/uL (ref 1.5–6.5)
NEUT%: 69.6 % (ref 39.0–75.0)
PLATELETS: 250 10*3/uL (ref 140–400)
RBC: 4.73 10*6/uL (ref 4.20–5.82)
RDW: 13.6 % (ref 11.0–14.6)
WBC: 6.6 10*3/uL (ref 4.0–10.3)

## 2017-11-08 LAB — COMPREHENSIVE METABOLIC PANEL
ALT: 15 U/L (ref 0–55)
AST: 25 U/L (ref 5–34)
Albumin: 3.8 g/dL (ref 3.5–5.0)
Alkaline Phosphatase: 116 U/L (ref 40–150)
Anion Gap: 12 mEq/L — ABNORMAL HIGH (ref 3–11)
BILIRUBIN TOTAL: 0.58 mg/dL (ref 0.20–1.20)
BUN: 21.7 mg/dL (ref 7.0–26.0)
CO2: 22 meq/L (ref 22–29)
Calcium: 11.2 mg/dL — ABNORMAL HIGH (ref 8.4–10.4)
Chloride: 103 mEq/L (ref 98–109)
Creatinine: 1 mg/dL (ref 0.7–1.3)
GLUCOSE: 71 mg/dL (ref 70–140)
POTASSIUM: 4 meq/L (ref 3.5–5.1)
SODIUM: 137 meq/L (ref 136–145)
TOTAL PROTEIN: 6.8 g/dL (ref 6.4–8.3)

## 2017-11-08 LAB — RESEARCH LABS

## 2017-11-08 LAB — TSH: TSH: 2.968 m[IU]/L (ref 0.320–4.118)

## 2017-11-08 MED ORDER — SODIUM CHLORIDE 0.9 % IV SOLN
Freq: Once | INTRAVENOUS | Status: AC
  Administered 2017-11-08: 12:00:00 via INTRAVENOUS

## 2017-11-08 MED ORDER — NIVOLUMAB CHEMO INJECTION 100 MG/10ML
480.0000 mg | Freq: Once | INTRAVENOUS | Status: AC
  Administered 2017-11-08: 480 mg via INTRAVENOUS
  Filled 2017-11-08: qty 48

## 2017-11-08 NOTE — Progress Notes (Signed)
[facility] [phone]   [phone]  OFFICE PROGRESS NOTE  [name], MD [place] [address]  DIAGNOSIS: Stage IV (T1a, N0, M1b) non-small cell lung cancer, adenocarcinoma of the right lower lobe diagnosed in October 2014  Molecular profile: Negative forRET, ALK, BRAF, MET, EGFR.  Positive for ERBB2 A775T, KPT4S568L, KRAS G13E, MAP2K1 D67N (see full report).   PRIOR THERAPY:  1) Systemic chemotherapy with carboplatin for AUC of 5 and Alimta 500 mg/M2 every 3 weeks, status post 6 cycles with stable disease. First dose on 10/23/2013. 2) Maintenance systemic chemotherapy with single agent Alimta 500 mg/M2 every 3 weeks. Status post 8 cycles.  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 Sondra Come completed on 12/16/2014. 4) stereotactic radiotherapy to the enlarging right upper lobe lung nodule under the care of Dr. Sondra Come completed on 11/22/2016. 5) Immunotherapy with Nivolumab 240 mg IV every 2 weeks, status post 60 cycles. First cycle was given on 01/28/2015. Last dose 07/05/2017.  CURRENT THERAPY: Immunotherapy with Nivolumab 480 mg IV every 4 weeks. First dose 07/19/2017. Status post 4 cycles.  INTERVAL HISTORY: Anthony Curry. 76 y.o. male returns to the clinic today for follow-up visit accompanied by his wife.  The patient has no complaints today.  He continues to tolerate his treatment with immunotherapy with Nivolumab fairly well.  He denied having any chest pain, shortness of breath, cough or hemoptysis.  He continues to have mild fatigue as well as intermittent dizzy spells.  He has no nausea, vomiting, diarrhea or constipation.  He denied having any skin rash.  The patient had a repeat CT scan of the chest, abdomen and pelvis performed recently and he is here for evaluation and discussion of his discuss results.   MEDICAL HISTORY: Past Medical History:    Diagnosis Date  . 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 Adrenal mass ([employer])   . Alcoholism in remission ([place])   . Anemia of chronic disease   . 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 Anxiety   . BPH (benign prostatic hyperplasia)   . Cancer ([place])    small cell/lung  . Complication of anesthesia 09/08/2015    JITTERY AFTER GALLBLADDER SURGERY  . GERD (gastroesophageal reflux disease)   . History of radiation therapy 11/14/16, 11/18/16, 11/22/16   Left upper lung: 54 Gy in 3 fractions  . Hypertension   . Hypertension 12/07/2016  . Neoplastic malignant related fatigue    IV tx. every 2 weeks last9-14-16 (Dr. Earlie Server), radiation last tx. 6 months  . 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 Persistent atrial fibrillation ([place])   . Pulmonary nodules   . Radiation 12/02/14-12/16/14   Left adrenal gland 30 Gy in 10 fractions    ALLERGIES:  has No Known Allergies.  MEDICATIONS:  Current Outpatient Medications  Medication Sig Dispense Refill  . ALPRAZolam (XANAX) 0.25 MG tablet Take 1 tablet (0.25 mg total) 2 (two) times daily as needed by mouth for anxiety. And also at night for sleep 60 tablet 0  . apixaban (ELIQUIS) 5 MG TABS tablet Take 5 mg by mouth 2 (two) times daily.    . Ascorbic Acid (VITAMIN C PO) Take 1,000 mg by mouth daily.     . B Complex-C (SUPER B COMPLEX PO) Take 1 each by mouth daily.     . calcium carbonate (OS-CAL) 600 MG TABS Take 600 mg by mouth  daily with breakfast.    . demeclocycline (DECLOMYCIN) 150 MG tablet Take 150 mg by mouth 2 (two) times daily.    [name] diltiazem (CARDIZEM CD) 180 MG 24 hr capsule Take 1 capsule (180 mg total) by mouth daily at 12 noon. 90 capsule 3  . furosemide (LASIX) 40 MG tablet Take 0.5 tablets (20 mg total) by mouth every other day.    [name] HYDROcodone-acetaminophen (NORCO/VICODIN) 5-325 MG tablet Take 1-2 tablets every 4 (four) hours as needed by mouth for moderate pain. 30 tablet 0  . KLOR-CON M20 20 MEQ tablet Take 20 mEq by  mouth every morning.     . Lactobacillus (ACIDOPHILUS) CAPS capsule Take 1 capsule by mouth daily.    [name] levothyroxine (SYNTHROID) 50 MCG tablet Take 1 tablet (50 mcg total) by mouth daily before breakfast. 30 tablet 2  . MAGNESIUM PO Take 400 mg by mouth daily.     . Multiple Vitamin (MULTIVITAMIN WITH MINERALS) TABS Take 1 tablet by mouth daily.    . Nivolumab (OPDIVO IV) Inject as directed every 2 weeks    . Nutritional Supplements (ENSURE COMPLETE PO) Take 1 Can by mouth 2 (two) times daily.    . Omega-3 Fatty Acids (FISH OIL PO) Take 1 tablet by mouth daily.    . pantoprazole (PROTONIX) 40 MG tablet Take 40 mg by mouth every morning.  8  . tamsulosin (FLOMAX) 0.4 MG CAPS capsule Take 0.4 mg by mouth daily at 12 noon.     . thiamine (VITAMIN B-1) 100 MG tablet Take 1 tablet (100 mg total) by mouth daily. 30 tablet 6  . vitamin E 400 UNIT capsule Take 400 Units by mouth daily.     No current facility-administered medications for this visit.     SURGICAL HISTORY:  Past Surgical History:  Procedure Laterality Date  . ANKLE ARTHRODESIS  1995   rt fx-hardware in  . CARDIOVERSION N/A 09/29/2014   Procedure: CARDIOVERSION;  Surgeon: Josue Hector, MD;  Location: [facility] ENDOSCOPY;  Service: Cardiovascular;  Laterality: N/A;  . CHOLECYSTECTOMY  09/08/2015    laproscopic   . CHOLECYSTECTOMY N/A 09/08/2015   Procedure: LAPAROSCOPIC CHOLECYSTECTOMY WITH ATTEMPTED INTRAOPERATIVE CHOLANGIOGRAM ;  Surgeon: Fanny Skates, MD;  Location: [facility];  Service: General;  Laterality: N/A;  . COLONOSCOPY    . ERCP N/A 09/02/2015   Procedure: ENDOSCOPIC RETROGRADE CHOLANGIOPANCREATOGRAPHY (ERCP);  Surgeon: Arta Silence, MD;  Location: [facility] ENDOS[facility];  Service: Endoscopy;  Laterality: N/A;  . ERCP N/A 09/07/2015   Procedure: ENDOSCOPIC RETROGRADE CHOLANGIOPANCREATOGRAPHY (ERCP);  Surgeon: Clarene Essex, MD;  Location: [facility] ENDOSCOPY;  Service: Endoscopy;  Laterality: N/A;  . EXCISION / CURETTAGE BONE CYST FINGER  2005     rt thumb  . HEMORRHOID SURGERY    . Percutaneous Cholecytostomy tube     IVR insertion 06-13-15(Dr. Vernard Gambles)- 08-28-15 remains in place to drainage bag  . TONSILLECTOMY    . TOTAL SHOULDER ARTHROPLASTY  02/03/2012   Procedure: TOTAL SHOULDER ARTHROPLASTY;  Surgeon: [name], MD;  Location: [facility];  Service: Orthopedics;  Laterality: Left;    REVIEW OF SYSTEMS:  Constitutional: positive for fatigue Eyes: negative Ears, nose, mouth, throat, and face: negative Respiratory: negative Cardiovascular: negative Gastrointestinal: negative Genitourinary:negative Integument/breast: negative Hematologic/lymphatic: negative Musculoskeletal:negative Neurological: positive for paresthesia Behavioral/Psych: positive for anxiety Endocrine: negative Allergic/Immunologic: negative   PHYSICAL EXAMINATION: General appearance: alert, cooperative, fatigued and no distress Head: Normocephalic, without obvious abnormality, atraumatic Neck: no adenopathy, no JVD, supple, symmetrical, trachea midline and  thyroid not enlarged, symmetric, no tenderness/mass/nodules Lymph nodes: Cervical, supraclavicular, and axillary nodes normal. Resp: clear to auscultation bilaterally Back: symmetric, no curvature. ROM normal. No CVA tenderness. Cardio: regular rate and rhythm, S1, S2 normal, no murmur, click, rub or gallop GI: soft, non-tender; bowel sounds normal; no masses,  no organomegaly Extremities: extremities normal, atraumatic, no cyanosis or edema Neurologic: Alert and oriented X 3, normal strength and tone. Normal symmetric reflexes. Normal coordination and gait  ECOG PERFORMANCE STATUS: 1 - Symptomatic but completely ambulatory  Blood pressure (!) 149/83, pulse 83, temperature 97.8 F (36.6 C), temperature source Oral, resp. rate 18, height 5' 11"  (1.803 m), weight 151 lb 6.4 oz (68.7 kg), SpO2 97 %.  LABORATORY DATA: Lab Results  Component Value Date   WBC 5.7 10/11/2017   HGB  14.2 10/11/2017   HCT 41.2 10/11/2017   MCV 95.2 10/11/2017   PLT 239 10/11/2017      Chemistry      Component Value Date/Time   NA 137 10/11/2017 1243   K 4.2 10/11/2017 1243   CL 98 (L) 03/15/2017 1206   CO2 26 10/11/2017 1243   BUN 22.9 10/11/2017 1243   CREATININE 0.8 10/11/2017 1243      Component Value Date/Time   CALCIUM 9.9 10/11/2017 1243   ALKPHOS 103 10/11/2017 1243   AST 19 10/11/2017 1243   ALT 14 10/11/2017 1243   BILITOT 0.57 10/11/2017 1243       RADIOGRAPHIC STUDIES: Ct Chest W Contrast  Result Date: 11/07/2017 CLINICAL DATA:  Followup right lower lobe lung carcinoma. Undergoing immunotherapy. Previous chemotherapy and radiation therapy . EXAM: CT CHEST, ABDOMEN, AND PELVIS WITH CONTRAST TECHNIQUE: Multidetector CT imaging of the chest, abdomen and pelvis was performed following the standard protocol during bolus administration of intravenous contrast. CONTRAST:  100 mL Omnipaque 300 COMPARISON:  08/15/2017 FINDINGS: CT CHEST FINDINGS Cardiovascular: No acute findings. Stable small pericardial effusion. Aortic and coronary artery atherosclerosis. Mediastinum/Lymph Nodes: No masses or pathologically enlarged lymph nodes identified. Lungs/Pleura: Mild emphysema again noted. Radiation fibrosis in the left paramediastinal lung zone remains stable. New infiltrates are seen in the dependent portions of both lungs mainly in the lower lobes. a sub-cm pulmonary nodule in the anterior right lower lobe remains stable. No evidence of pleural effusion. Multifocal bilateral areas of ground-glass opacity mainly in the upper lobes remain stable. Musculoskeletal:  No suspicious bone lesions identified. CT ABDOMEN AND PELVIS FINDINGS Hepatobiliary: A sub-cm flash-filling lesion is again seen in the anterior right hepatic lobe which is stable and consistent with benign etiology, most likely a benign hemangioma. A few tiny sub-cm cysts are also stable. No new or enlarging hepatic masses are  identified. Prior cholecystectomy. No evidence of biliary obstruction. Pancreas:  No mass or inflammatory changes. Spleen:  Within normal limits in size and appearance. Adrenals/Urinary tract: No masses or hydronephrosis. Mild bilateral renal parenchymal scarring is stable. Stable mildly enlarged urinary bladder with several bladder diverticula. Stomach/Bowel: No evidence of obstruction, inflammatory process, or abnormal fluid collections. Normal appendix visualized. Vascular/Lymphatic: No pathologically enlarged lymph nodes identified. No abdominal aortic aneurysm. Aortic atherosclerosis. Reproductive:  No mass or other significant abnormality identified. Other:  None. Musculoskeletal: No suspicious bone lesions identified. Advanced lumbar degenerative spondylosis. Left shoulder prosthesis and old right rib fracture deformities also noted. IMPRESSION: New bilateral dependent pulmonary infiltrates mainly in the lower lobes. Differential diagnosis includes drug reaction, pneumonia, or other inflammatory etiologies. No evidence of new or progressive malignancy. Aortic Atherosclerosis (ICD10-I70.0) and Emphysema (ICD10-J43.9).  Coronary artery calcification. Electronically Signed   By: Earle Gell M.D.   On: 11/07/2017 09:02   Ct Abdomen Pelvis W Contrast  Result Date: 11/07/2017 CLINICAL DATA:  Followup right lower lobe lung carcinoma. Undergoing immunotherapy. Previous chemotherapy and radiation therapy . EXAM: CT CHEST, ABDOMEN, AND PELVIS WITH CONTRAST TECHNIQUE: Multidetector CT imaging of the chest, abdomen and pelvis was performed following the standard protocol during bolus administration of intravenous contrast. CONTRAST:  100 mL Omnipaque 300 COMPARISON:  08/15/2017 FINDINGS: CT CHEST FINDINGS Cardiovascular: No acute findings. Stable small pericardial effusion. Aortic and coronary artery atherosclerosis. Mediastinum/Lymph Nodes: No masses or pathologically enlarged lymph nodes identified. Lungs/Pleura:  Mild emphysema again noted. Radiation fibrosis in the left paramediastinal lung zone remains stable. New infiltrates are seen in the dependent portions of both lungs mainly in the lower lobes. a sub-cm pulmonary nodule in the anterior right lower lobe remains stable. No evidence of pleural effusion. Multifocal bilateral areas of ground-glass opacity mainly in the upper lobes remain stable. Musculoskeletal:  No suspicious bone lesions identified. CT ABDOMEN AND PELVIS FINDINGS Hepatobiliary: A sub-cm flash-filling lesion is again seen in the anterior right hepatic lobe which is stable and consistent with benign etiology, most likely a benign hemangioma. A few tiny sub-cm cysts are also stable. No new or enlarging hepatic masses are identified. Prior cholecystectomy. No evidence of biliary obstruction. Pancreas:  No mass or inflammatory changes. Spleen:  Within normal limits in size and appearance. Adrenals/Urinary tract: No masses or hydronephrosis. Mild bilateral renal parenchymal scarring is stable. Stable mildly enlarged urinary bladder with several bladder diverticula. Stomach/Bowel: No evidence of obstruction, inflammatory process, or abnormal fluid collections. Normal appendix visualized. Vascular/Lymphatic: No pathologically enlarged lymph nodes identified. No abdominal aortic aneurysm. Aortic atherosclerosis. Reproductive:  No mass or other significant abnormality identified. Other:  None. Musculoskeletal: No suspicious bone lesions identified. Advanced lumbar degenerative spondylosis. Left shoulder prosthesis and old right rib fracture deformities also noted. IMPRESSION: New bilateral dependent pulmonary infiltrates mainly in the lower lobes. Differential diagnosis includes drug reaction, pneumonia, or other inflammatory etiologies. No evidence of new or progressive malignancy. Aortic Atherosclerosis (ICD10-I70.0) and Emphysema (ICD10-J43.9). Coronary artery calcification. Electronically Signed   By: Earle Gell M.D.   On: 11/07/2017 09:02    ASSESSMENT AND PLAN:  This is a very pleasant 76 years old white male with metastatic non-small cell lung cancer, adenocarcinoma and currently on treatment with second line immunotherapy with Nivolumab status post 60 cycles and has been tolerating his treatment well. He was recently switched to Nivolumab 480 MG IV every 4 weeks status post 4 cycles. The patient continues to tolerate this treatment fairly well with no significant adverse effects. He had a repeat CT scan of the chest, abdomen and pelvis performed recently.  I personally and independently reviewed the scan images and discussed the results with the patient and his wife.  He has a scan showed no evidence for new or progressive malignancy but he has new bilateral dependent pulmonary infiltrates in the lower lobes.  The patient is currently asymptomatic with no significant shortness of breath, chest pain or fever or chills.  This could be inflammatory in origin and will continue to monitor it closely. I recommended for the patient to proceed with cycle #5 today as a schedule. For insomnia, he will continue on Xanax. For hypothyroidism, the patient will continue his current treatment with levothyroxine. He will come back for follow-up visit in 4 weeks for evaluation before the next cycle of his  treatment. He was advised to call immediately if he has any concerning symptoms in the interval. The patient voices understanding of current disease status and treatment options and is in agreement with the current care plan. All questions were answered. The patient knows to call the clinic with any problems, questions or concerns. We can certainly see the patient much sooner if necessary.  Disclaimer: This note was dictated with voice recognition software. Similar sounding words can inadvertently be transcribed and may not be corrected upon review.

## 2017-11-08 NOTE — Patient Instructions (Signed)
[facility] Discharge Instructions for Patients Receiving Chemotherapy  Today you received the following chemotherapy agents:  Nivolumab.  To help prevent nausea and vomiting after your treatment, we encourage you to take your nausea medication as directed.   If you develop nausea and vomiting that is not controlled by your nausea medication, call the clinic.   BELOW ARE SYMPTOMS THAT SHOULD BE REPORTED IMMEDIATELY:  *FEVER GREATER THAN 100.5 F  *CHILLS WITH OR WITHOUT FEVER  NAUSEA AND VOMITING THAT IS NOT CONTROLLED WITH YOUR NAUSEA MEDICATION  *UNUSUAL SHORTNESS OF BREATH  *UNUSUAL BRUISING OR BLEEDING  TENDERNESS IN MOUTH AND THROAT WITH OR WITHOUT PRESENCE OF ULCERS  *URINARY PROBLEMS  *BOWEL PROBLEMS  UNUSUAL RASH Items with * indicate a potential emergency and should be followed up as soon as possible.  Feel free to call the clinic you have any questions or concerns. The clinic phone number is [phone].  Please show the CHEMO ALERT CARD at check-in to the Emergency Department and triage nurse.   

## 2017-11-10 ENCOUNTER — Telehealth: Payer: Self-pay | Admitting: Internal Medicine

## 2017-11-10 DIAGNOSIS — H353221 Exudative age-related macular degeneration, left eye, with active choroidal neovascularization: Secondary | ICD-10-CM | POA: Diagnosis not present

## 2017-11-10 NOTE — Telephone Encounter (Signed)
Spoke to patients wife regarding upcoming January and February appointments.

## 2017-11-22 DIAGNOSIS — H1132 Conjunctival hemorrhage, left eye: Secondary | ICD-10-CM | POA: Diagnosis not present

## 2017-11-24 ENCOUNTER — Other Ambulatory Visit: Payer: Self-pay | Admitting: Medical Oncology

## 2017-11-24 DIAGNOSIS — C3490 Malignant neoplasm of unspecified part of unspecified bronchus or lung: Secondary | ICD-10-CM

## 2017-11-24 DIAGNOSIS — G893 Neoplasm related pain (acute) (chronic): Secondary | ICD-10-CM

## 2017-11-24 MED ORDER — HYDROCODONE-ACETAMINOPHEN 5-325 MG PO TABS: 1.0000 | ORAL_TABLET | ORAL | 0 refills | Status: DC | PRN

## 2017-11-24 NOTE — Addendum Note (Signed)
Addended by: Ardeen Garland on: 11/24/2017 12:20 PM   Modules accepted: Orders

## 2017-11-25 ENCOUNTER — Other Ambulatory Visit: Payer: Self-pay | Admitting: Internal Medicine

## 2017-11-29 ENCOUNTER — Other Ambulatory Visit: Payer: Self-pay | Admitting: Medical Oncology

## 2017-11-29 DIAGNOSIS — G893 Neoplasm related pain (acute) (chronic): Secondary | ICD-10-CM

## 2017-11-29 DIAGNOSIS — C3490 Malignant neoplasm of unspecified part of unspecified bronchus or lung: Secondary | ICD-10-CM

## 2017-11-29 MED ORDER — ALPRAZOLAM 0.25 MG PO TABS: 0.2500 mg | ORAL_TABLET | Freq: Two times a day (BID) | ORAL | 0 refills | Status: DC | PRN

## 2017-12-04 ENCOUNTER — Other Ambulatory Visit: Payer: Self-pay | Admitting: Medical Oncology

## 2017-12-04 DIAGNOSIS — C3431 Malignant neoplasm of lower lobe, right bronchus or lung: Secondary | ICD-10-CM

## 2017-12-06 ENCOUNTER — Inpatient Hospital Stay: Payer: Medicare Other | Attending: Internal Medicine | Admitting: Internal Medicine

## 2017-12-06 ENCOUNTER — Other Ambulatory Visit (HOSPITAL_BASED_OUTPATIENT_CLINIC_OR_DEPARTMENT_OTHER): Payer: Medicare Other

## 2017-12-06 ENCOUNTER — Ambulatory Visit (HOSPITAL_BASED_OUTPATIENT_CLINIC_OR_DEPARTMENT_OTHER): Payer: Medicare Other

## 2017-12-06 ENCOUNTER — Encounter: Payer: Self-pay | Admitting: Internal Medicine

## 2017-12-06 VITALS — BP 121/67 | HR 76 | Temp 97.0°F | Resp 15 | Ht 71.0 in | Wt 153.6 lb

## 2017-12-06 DIAGNOSIS — G4700 Insomnia, unspecified: Secondary | ICD-10-CM

## 2017-12-06 DIAGNOSIS — E039 Hypothyroidism, unspecified: Secondary | ICD-10-CM | POA: Insufficient documentation

## 2017-12-06 DIAGNOSIS — Z5112 Encounter for antineoplastic immunotherapy: Secondary | ICD-10-CM | POA: Diagnosis present

## 2017-12-06 DIAGNOSIS — C3431 Malignant neoplasm of lower lobe, right bronchus or lung: Secondary | ICD-10-CM

## 2017-12-06 DIAGNOSIS — Z79899 Other long term (current) drug therapy: Secondary | ICD-10-CM | POA: Insufficient documentation

## 2017-12-06 DIAGNOSIS — G620 Drug-induced polyneuropathy: Secondary | ICD-10-CM

## 2017-12-06 DIAGNOSIS — R5382 Chronic fatigue, unspecified: Secondary | ICD-10-CM

## 2017-12-06 DIAGNOSIS — C7972 Secondary malignant neoplasm of left adrenal gland: Secondary | ICD-10-CM | POA: Insufficient documentation

## 2017-12-06 LAB — CBC WITH DIFFERENTIAL/PLATELET
BASO%: 0.3 % (ref 0.0–2.0)
Basophils Absolute: 0 10*3/uL (ref 0.0–0.1)
EOS%: 5.4 % (ref 0.0–7.0)
Eosinophils Absolute: 0.3 10*3/uL (ref 0.0–0.5)
HCT: 39.8 % (ref 38.4–49.9)
HGB: 14 g/dL (ref 13.0–17.1)
LYMPH#: 1 10*3/uL (ref 0.9–3.3)
LYMPH%: 17 % (ref 14.0–49.0)
MCH: 32.6 pg (ref 27.2–33.4)
MCHC: 35.2 g/dL (ref 32.0–36.0)
MCV: 92.8 fL (ref 79.3–98.0)
MONO#: 0.9 10*3/uL (ref 0.1–0.9)
MONO%: 15.7 % — ABNORMAL HIGH (ref 0.0–14.0)
NEUT#: 3.6 10*3/uL (ref 1.5–6.5)
NEUT%: 61.6 % (ref 39.0–75.0)
Platelets: 230 10*3/uL (ref 140–400)
RBC: 4.29 10*6/uL (ref 4.20–5.82)
RDW: 14.1 % (ref 11.0–14.6)
WBC: 5.8 10*3/uL (ref 4.0–10.3)

## 2017-12-06 LAB — COMPREHENSIVE METABOLIC PANEL
ALT: 14 U/L (ref 0–55)
AST: 20 U/L (ref 5–34)
Albumin: 3.7 g/dL (ref 3.5–5.0)
Alkaline Phosphatase: 107 U/L (ref 40–150)
Anion Gap: 9 mEq/L (ref 3–11)
BUN: 15.4 mg/dL (ref 7.0–26.0)
CHLORIDE: 97 meq/L — AB (ref 98–109)
CO2: 26 meq/L (ref 22–29)
Calcium: 9.9 mg/dL (ref 8.4–10.4)
Creatinine: 0.9 mg/dL (ref 0.7–1.3)
EGFR: >60 mL/min/{1.73_m2} (ref >60)
Glucose: 89 mg/dl (ref 70–140)
Potassium: 4 mEq/L (ref 3.5–5.1)
Sodium: 132 mEq/L — ABNORMAL LOW (ref 136–145)
Total Bilirubin: 0.76 mg/dL (ref 0.20–1.20)
Total Protein: 6.3 g/dL — ABNORMAL LOW (ref 6.4–8.3)

## 2017-12-06 LAB — TSH: TSH: 5.148 m[IU]/L — AB (ref 0.320–4.118)

## 2017-12-06 MED ORDER — SODIUM CHLORIDE 0.9 % IV SOLN
Freq: Once | INTRAVENOUS | Status: AC
  Administered 2017-12-06: 12:00:00 via INTRAVENOUS

## 2017-12-06 MED ORDER — SODIUM CHLORIDE 0.9 % IV SOLN
480.0000 mg | Freq: Once | INTRAVENOUS | Status: AC
  Administered 2017-12-06: 480 mg via INTRAVENOUS
  Filled 2017-12-06: qty 48

## 2017-12-06 NOTE — Progress Notes (Signed)
[place] [phone]) [phone]   [phone]) [phone]  OFFICE PROGRESS NOTE  [name], MD [place] [address]  DIAGNOSIS: Stage IV (T1a, N0, M1b) non-small cell lung cancer, adenocarcinoma of the right lower lobe diagnosed in October 2014  Molecular profile: Negative forRET, ALK, BRAF, MET, EGFR.  Positive for ERBB2 A775T, [id number], KRAS G13E, MAP2K1 D67N (see full report).   PRIOR THERAPY:  1) Systemic chemotherapy with carboplatin for AUC of 5 and Alimta 500 mg/M2 every 3 weeks, status post 6 cycles with stable disease. First dose on 10/23/2013. 2) Maintenance systemic chemotherapy with single agent Alimta 500 mg/M2 every 3 weeks. Status post 8 cycles.  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 Sondra Come completed on 12/16/2014. 4) stereotactic radiotherapy to the enlarging right upper lobe lung nodule under the care of Dr. Sondra Come completed on 11/22/2016. 5) Immunotherapy with Nivolumab 240 mg IV every 2 weeks, status post 60 cycles. First cycle was given on 01/28/2015. Last dose 07/05/2017.  CURRENT THERAPY: Immunotherapy with Nivolumab 480 mg IV every 4 weeks. First dose 07/19/2017. Status post 5 cycles.  INTERVAL HISTORY: Anthony Curry. 77 y.o. male returns to the clinic today for follow-up visit accompanied by his wife.  The patient is feeling fine with no specific complaints except for the persistent peripheral neuropathy.  He denied having any chest pain, shortness breath, cough or hemoptysis.  He denied having any weight loss or night sweats.  He has no nausea, vomiting, diarrhea or constipation.  He is here today for evaluation before starting cycle #6 of his treatment.   MEDICAL HISTORY: Past Medical History:  Diagnosis Date  . 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 Adrenal mass ([place])   . Alcoholism in remission ([place])   . Anemia of chronic disease   . Antineoplastic  chemotherapy induced anemia(285.3)   . Anxiety   . BPH (benign prostatic hyperplasia)   . Cancer ([place])    small cell/lung  . Complication of anesthesia 09/08/2015    JITTERY AFTER GALLBLADDER SURGERY  . GERD (gastroesophageal reflux disease)   . History of radiation therapy 11/14/16, 11/18/16, 11/22/16   Left upper lung: 54 Gy in 3 fractions  . Hypertension   . Hypertension 12/07/2016  . Neoplastic malignant related fatigue    IV tx. every 2 weeks last9-14-16 (Dr. Earlie Server), radiation last tx. 6 months  . 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 Persistent atrial fibrillation ([place])   . Pulmonary nodules   . Radiation 12/02/14-12/16/14   Left adrenal gland 30 Gy in 10 fractions    ALLERGIES:  has No Known Allergies.  MEDICATIONS:  Current Outpatient Medications  Medication Sig Dispense Refill  . ALPRAZolam (XANAX) 0.25 MG tablet Take 1 tablet (0.25 mg total) by mouth 2 (two) times daily as needed for anxiety. And also at night for sleep 60 tablet 0  . apixaban (ELIQUIS) 5 MG TABS tablet Take 5 mg by mouth 2 (two) times daily.    . Ascorbic Acid (VITAMIN C PO) Take 1,000 mg by mouth daily.     . B Complex-C (SUPER B COMPLEX PO) Take 1 each by mouth daily.     . calcium carbonate (OS-CAL) 600 MG TABS Take 600 mg by mouth daily with breakfast.    . demeclocycline (DECLOMYCIN) 150 MG tablet Take 150 mg by mouth 2 (two) times daily.    [name] diltiazem (CARDIZEM CD) 180 MG  24 hr capsule Take 1 capsule (180 mg total) by mouth daily at 12 noon. 90 capsule 3  . furosemide (LASIX) 40 MG tablet Take 0.5 tablets (20 mg total) by mouth every other day.    [name] HYDROcodone-acetaminophen (NORCO/VICODIN) 5-325 MG tablet Take 1-2 tablets by mouth every 4 (four) hours as needed for moderate pain. 30 tablet 0  . KLOR-CON M20 20 MEQ tablet Take 20 mEq by mouth every morning.     . Lactobacillus (ACIDOPHILUS) CAPS capsule Take 1 capsule by mouth daily.    [name] levothyroxine (SYNTHROID, LEVOTHROID) 50 MCG tablet  TAKE 1 TABLET (50 MCG TOTAL) BY MOUTH DAILY BEFORE BREAKFAST. 30 tablet 2  . MAGNESIUM PO Take 400 mg by mouth daily.     . Multiple Vitamin (MULTIVITAMIN WITH MINERALS) TABS Take 1 tablet by mouth daily.    . Nivolumab (OPDIVO IV) Inject as directed every 2 weeks    . Nutritional Supplements (ENSURE COMPLETE PO) Take 1 Can by mouth 2 (two) times daily.    . Omega-3 Fatty Acids (FISH OIL PO) Take 1 tablet by mouth daily.    . pantoprazole (PROTONIX) 40 MG tablet Take 40 mg by mouth every morning.  8  . tamsulosin (FLOMAX) 0.4 MG CAPS capsule Take 0.4 mg by mouth daily at 12 noon.     . thiamine (VITAMIN B-1) 100 MG tablet Take 1 tablet (100 mg total) by mouth daily. 30 tablet 6  . vitamin E 400 UNIT capsule Take 400 Units by mouth daily.     No current facility-administered medications for this visit.     SURGICAL HISTORY:  Past Surgical History:  Procedure Laterality Date  . ANKLE ARTHRODESIS  1995   rt fx-hardware in  . CARDIOVERSION N/A 09/29/2014   Procedure: CARDIOVERSION;  Surgeon: Josue Hector, MD;  Location: [facility] ENDOSCOPY;  Service: Cardiovascular;  Laterality: N/A;  . CHOLECYSTECTOMY  09/08/2015    laproscopic   . CHOLECYSTECTOMY N/A 09/08/2015   Procedure: LAPAROSCOPIC CHOLECYSTECTOMY WITH ATTEMPTED INTRAOPERATIVE CHOLANGIOGRAM ;  Surgeon: Fanny Skates, MD;  Location: [facility];  Service: General;  Laterality: N/A;  . COLONOSCOPY    . ERCP N/A 09/02/2015   Procedure: ENDOSCOPIC RETROGRADE CHOLANGIOPANCREATOGRAPHY (ERCP);  Surgeon: Arta Silence, MD;  Location: [facility] ENDOSCOPY;  Service: Endoscopy;  Laterality: N/A;  . ERCP N/A 09/07/2015   Procedure: ENDOSCOPIC RETROGRADE CHOLANGIOPANCREATOGRAPHY (ERCP);  Surgeon: Clarene Essex, MD;  Location: [facility] ENDOSCOPY;  Service: Endoscopy;  Laterality: N/A;  . EXCISION / CURETTAGE BONE CYST FINGER  2005   rt thumb  . HEMORRHOID SURGERY    . Percutaneous Cholecytostomy tube     IVR insertion 06-13-15(Dr. Vernard Gambles)- 08-28-15 remains in place to  drainage bag  . TONSILLECTOMY    . TOTAL SHOULDER ARTHROPLASTY  02/03/2012   Procedure: TOTAL SHOULDER ARTHROPLASTY;  Surgeon: [name], MD;  Location: [facility];  Service: Orthopedics;  Laterality: Left;    REVIEW OF SYSTEMS:  A comprehensive review of systems was negative except for: Neurological: positive for paresthesia   PHYSICAL EXAMINATION: General appearance: alert, cooperative and no distress Head: Normocephalic, without obvious abnormality, atraumatic Neck: no adenopathy, no JVD, supple, symmetrical, trachea midline and thyroid not enlarged, symmetric, no tenderness/mass/nodules Lymph nodes: Cervical, supraclavicular, and axillary nodes normal. Resp: clear to auscultation bilaterally Back: symmetric, no curvature. ROM normal. No CVA tenderness. Cardio: regular rate and rhythm, S1, S2 normal, no murmur, click, rub or gallop GI: soft, non-tender; bowel sounds normal; no masses,  no organomegaly Extremities: extremities  normal, atraumatic, no cyanosis or edema  ECOG PERFORMANCE STATUS: 1 - Symptomatic but completely ambulatory  Blood pressure 121/67, pulse 76, temperature (!) 97 F (36.1 C), temperature source Oral, resp. rate 15, height 5' 11"  (1.803 m), weight 153 lb 9.6 oz (69.7 kg), SpO2 93 %.  LABORATORY DATA: Lab Results  Component Value Date   WBC 5.8 12/06/2017   HGB 14.0 12/06/2017   HCT 39.8 12/06/2017   MCV 92.8 12/06/2017   PLT 230 12/06/2017      Chemistry      Component Value Date/Time   NA 132 (L) 12/06/2017 0930   K 4.0 12/06/2017 0930   CL 98 (L) 03/15/2017 1206   CO2 26 12/06/2017 0930   BUN 15.4 12/06/2017 0930   CREATININE 0.9 12/06/2017 0930      Component Value Date/Time   CALCIUM 9.9 12/06/2017 0930   ALKPHOS 107 12/06/2017 0930   AST 20 12/06/2017 0930   ALT 14 12/06/2017 0930   BILITOT 0.76 12/06/2017 0930       RADIOGRAPHIC STUDIES: Ct Chest W Contrast  Result Date: 11/07/2017 CLINICAL DATA:  Followup right  lower lobe lung carcinoma. Undergoing immunotherapy. Previous chemotherapy and radiation therapy . EXAM: CT CHEST, ABDOMEN, AND PELVIS WITH CONTRAST TECHNIQUE: Multidetector CT imaging of the chest, abdomen and pelvis was performed following the standard protocol during bolus administration of intravenous contrast. CONTRAST:  100 mL Omnipaque 300 COMPARISON:  08/15/2017 FINDINGS: CT CHEST FINDINGS Cardiovascular: No acute findings. Stable small pericardial effusion. Aortic and coronary artery atherosclerosis. Mediastinum/Lymph Nodes: No masses or pathologically enlarged lymph nodes identified. Lungs/Pleura: Mild emphysema again noted. Radiation fibrosis in the left paramediastinal lung zone remains stable. New infiltrates are seen in the dependent portions of both lungs mainly in the lower lobes. a sub-cm pulmonary nodule in the anterior right lower lobe remains stable. No evidence of pleural effusion. Multifocal bilateral areas of ground-glass opacity mainly in the upper lobes remain stable. Musculoskeletal:  No suspicious bone lesions identified. CT ABDOMEN AND PELVIS FINDINGS Hepatobiliary: A sub-cm flash-filling lesion is again seen in the anterior right hepatic lobe which is stable and consistent with benign etiology, most likely a benign hemangioma. A few tiny sub-cm cysts are also stable. No new or enlarging hepatic masses are identified. Prior cholecystectomy. No evidence of biliary obstruction. Pancreas:  No mass or inflammatory changes. Spleen:  Within normal limits in size and appearance. Adrenals/Urinary tract: No masses or hydronephrosis. Mild bilateral renal parenchymal scarring is stable. Stable mildly enlarged urinary bladder with several bladder diverticula. Stomach/Bowel: No evidence of obstruction, inflammatory process, or abnormal fluid collections. Normal appendix visualized. Vascular/Lymphatic: No pathologically enlarged lymph nodes identified. No abdominal aortic aneurysm. Aortic  atherosclerosis. Reproductive:  No mass or other significant abnormality identified. Other:  None. Musculoskeletal: No suspicious bone lesions identified. Advanced lumbar degenerative spondylosis. Left shoulder prosthesis and old right rib fracture deformities also noted. IMPRESSION: New bilateral dependent pulmonary infiltrates mainly in the lower lobes. Differential diagnosis includes drug reaction, pneumonia, or other inflammatory etiologies. No evidence of new or progressive malignancy. Aortic Atherosclerosis (ICD10-I70.0) and Emphysema (ICD10-J43.9). Coronary artery calcification. Electronically Signed   By: Earle Gell M.D.   On: 11/07/2017 09:02   Ct Abdomen Pelvis W Contrast  Result Date: 11/07/2017 CLINICAL DATA:  Followup right lower lobe lung carcinoma. Undergoing immunotherapy. Previous chemotherapy and radiation therapy . EXAM: CT CHEST, ABDOMEN, AND PELVIS WITH CONTRAST TECHNIQUE: Multidetector CT imaging of the chest, abdomen and pelvis was performed following the standard protocol during bolus  administration of intravenous contrast. CONTRAST:  100 mL Omnipaque 300 COMPARISON:  08/15/2017 FINDINGS: CT CHEST FINDINGS Cardiovascular: No acute findings. Stable small pericardial effusion. Aortic and coronary artery atherosclerosis. Mediastinum/Lymph Nodes: No masses or pathologically enlarged lymph nodes identified. Lungs/Pleura: Mild emphysema again noted. Radiation fibrosis in the left paramediastinal lung zone remains stable. New infiltrates are seen in the dependent portions of both lungs mainly in the lower lobes. a sub-cm pulmonary nodule in the anterior right lower lobe remains stable. No evidence of pleural effusion. Multifocal bilateral areas of ground-glass opacity mainly in the upper lobes remain stable. Musculoskeletal:  No suspicious bone lesions identified. CT ABDOMEN AND PELVIS FINDINGS Hepatobiliary: A sub-cm flash-filling lesion is again seen in the anterior right hepatic lobe which  is stable and consistent with benign etiology, most likely a benign hemangioma. A few tiny sub-cm cysts are also stable. No new or enlarging hepatic masses are identified. Prior cholecystectomy. No evidence of biliary obstruction. Pancreas:  No mass or inflammatory changes. Spleen:  Within normal limits in size and appearance. Adrenals/Urinary tract: No masses or hydronephrosis. Mild bilateral renal parenchymal scarring is stable. Stable mildly enlarged urinary bladder with several bladder diverticula. Stomach/Bowel: No evidence of obstruction, inflammatory process, or abnormal fluid collections. Normal appendix visualized. Vascular/Lymphatic: No pathologically enlarged lymph nodes identified. No abdominal aortic aneurysm. Aortic atherosclerosis. Reproductive:  No mass or other significant abnormality identified. Other:  None. Musculoskeletal: No suspicious bone lesions identified. Advanced lumbar degenerative spondylosis. Left shoulder prosthesis and old right rib fracture deformities also noted. IMPRESSION: New bilateral dependent pulmonary infiltrates mainly in the lower lobes. Differential diagnosis includes drug reaction, pneumonia, or other inflammatory etiologies. No evidence of new or progressive malignancy. Aortic Atherosclerosis (ICD10-I70.0) and Emphysema (ICD10-J43.9). Coronary artery calcification. Electronically Signed   By: Earle Gell M.D.   On: 11/07/2017 09:02    ASSESSMENT AND PLAN:  This is a very pleasant 77 years old white male with metastatic non-small cell lung cancer, adenocarcinoma and currently on treatment with second line immunotherapy with Nivolumab status post 60 cycles and has been tolerating his treatment well. He was recently switched to Nivolumab 480 MG IV every 4 weeks status post 5 cycles. The patient tolerated the last cycle of his treatment well with no significant adverse effects. I recommended for him to proceed with cycle #6 today as a schedule. He will come back for  follow-up visit in 4 weeks for evaluation before the next cycle of his treatment. For insomnia, he will continue on Xanax. For hypothyroidism, the patient will continue his current treatment with levothyroxine. He was advised to call immediately if he has any concerning symptoms in the interval. The patient voices understanding of current disease status and treatment options and is in agreement with the current care plan. All questions were answered. The patient knows to call the clinic with any problems, questions or concerns. We can certainly see the patient much sooner if necessary.  Disclaimer: This note was dictated with voice recognition software. Similar sounding words can inadvertently be transcribed and may not be corrected upon review.

## 2017-12-06 NOTE — Patient Instructions (Signed)
[facility] Discharge Instructions for Patients Receiving Chemotherapy  Today you received the following chemotherapy agents:  Nivolumab.  To help prevent nausea and vomiting after your treatment, we encourage you to take your nausea medication as directed.   If you develop nausea and vomiting that is not controlled by your nausea medication, call the clinic.   BELOW ARE SYMPTOMS THAT SHOULD BE REPORTED IMMEDIATELY:  *FEVER GREATER THAN 100.5 F  *CHILLS WITH OR WITHOUT FEVER  NAUSEA AND VOMITING THAT IS NOT CONTROLLED WITH YOUR NAUSEA MEDICATION  *UNUSUAL SHORTNESS OF BREATH  *UNUSUAL BRUISING OR BLEEDING  TENDERNESS IN MOUTH AND THROAT WITH OR WITHOUT PRESENCE OF ULCERS  *URINARY PROBLEMS  *BOWEL PROBLEMS  UNUSUAL RASH Items with * indicate a potential emergency and should be followed up as soon as possible.  Feel free to call the clinic you have any questions or concerns. The clinic phone number is [phone].  Please show the CHEMO ALERT CARD at check-in to the Emergency Department and triage nurse.   

## 2017-12-13 ENCOUNTER — Telehealth: Payer: Self-pay | Admitting: Internal Medicine

## 2017-12-13 NOTE — Telephone Encounter (Signed)
Mailed patient calendar of upcoming March appointments °

## 2017-12-25 ENCOUNTER — Other Ambulatory Visit: Payer: Self-pay | Admitting: *Deleted

## 2017-12-25 MED ORDER — LEVOTHYROXINE SODIUM 50 MCG PO TABS: 50.0000 ug | ORAL_TABLET | Freq: Every day | ORAL | 0 refills | Status: DC

## 2017-12-25 NOTE — Telephone Encounter (Signed)
Rx refill sent to pt pharmacy

## 2018-01-02 ENCOUNTER — Other Ambulatory Visit: Payer: Self-pay | Admitting: Medical Oncology

## 2018-01-02 DIAGNOSIS — G893 Neoplasm related pain (acute) (chronic): Secondary | ICD-10-CM

## 2018-01-02 DIAGNOSIS — C3490 Malignant neoplasm of unspecified part of unspecified bronchus or lung: Secondary | ICD-10-CM

## 2018-01-02 MED ORDER — ALPRAZOLAM 0.25 MG PO TABS: 0.2500 mg | ORAL_TABLET | Freq: Two times a day (BID) | ORAL | 0 refills | Status: DC | PRN

## 2018-01-03 ENCOUNTER — Inpatient Hospital Stay: Payer: Medicare Other

## 2018-01-03 ENCOUNTER — Telehealth: Payer: Self-pay | Admitting: Internal Medicine

## 2018-01-03 ENCOUNTER — Encounter: Payer: Self-pay | Admitting: Internal Medicine

## 2018-01-03 ENCOUNTER — Inpatient Hospital Stay (HOSPITAL_BASED_OUTPATIENT_CLINIC_OR_DEPARTMENT_OTHER): Payer: Medicare Other | Admitting: Internal Medicine

## 2018-01-03 DIAGNOSIS — Z79899 Other long term (current) drug therapy: Secondary | ICD-10-CM | POA: Diagnosis not present

## 2018-01-03 DIAGNOSIS — C3431 Malignant neoplasm of lower lobe, right bronchus or lung: Secondary | ICD-10-CM | POA: Diagnosis not present

## 2018-01-03 DIAGNOSIS — C7972 Secondary malignant neoplasm of left adrenal gland: Secondary | ICD-10-CM | POA: Diagnosis not present

## 2018-01-03 DIAGNOSIS — Z5112 Encounter for antineoplastic immunotherapy: Secondary | ICD-10-CM | POA: Diagnosis not present

## 2018-01-03 DIAGNOSIS — C3490 Malignant neoplasm of unspecified part of unspecified bronchus or lung: Secondary | ICD-10-CM

## 2018-01-03 DIAGNOSIS — E039 Hypothyroidism, unspecified: Secondary | ICD-10-CM | POA: Diagnosis not present

## 2018-01-03 DIAGNOSIS — R5382 Chronic fatigue, unspecified: Secondary | ICD-10-CM

## 2018-01-03 DIAGNOSIS — G893 Neoplasm related pain (acute) (chronic): Secondary | ICD-10-CM

## 2018-01-03 LAB — CMP (CANCER CENTER ONLY)
ALBUMIN: 3.7 g/dL (ref 3.5–5.0)
ALT: 11 U/L (ref 0–55)
AST: 21 U/L (ref 5–34)
Alkaline Phosphatase: 97 U/L (ref 40–150)
Anion gap: 9 (ref 3–11)
BUN: 22 mg/dL (ref 7–26)
CHLORIDE: 97 mmol/L — AB (ref 98–109)
CO2: 25 mmol/L (ref 22–29)
Calcium: 9.8 mg/dL (ref 8.4–10.4)
Creatinine: 0.87 mg/dL (ref 0.70–1.30)
GFR, Est AFR Am: >60 mL/min (ref >60)
GFR, Estimated: >60 mL/min (ref >60)
Glucose, Bld: 84 mg/dL (ref 70–140)
POTASSIUM: 4.1 mmol/L (ref 3.5–5.1)
Sodium: 131 mmol/L — ABNORMAL LOW (ref 136–145)
Total Bilirubin: 0.7 mg/dL (ref 0.2–1.2)
Total Protein: 6.3 g/dL — ABNORMAL LOW (ref 6.4–8.3)

## 2018-01-03 LAB — CBC WITH DIFFERENTIAL (CANCER CENTER ONLY)
BASOS ABS: 0 10*3/uL (ref 0.0–0.1)
Basophils Relative: 0 %
EOS PCT: 4 %
Eosinophils Absolute: 0.2 10*3/uL (ref 0.0–0.5)
HEMATOCRIT: 40.5 % (ref 38.4–49.9)
Hemoglobin: 14.2 g/dL (ref 13.0–17.1)
Lymphocytes Relative: 17 %
Lymphs Abs: 1 10*3/uL (ref 0.9–3.3)
MCH: 32.3 pg (ref 27.2–33.4)
MCHC: 35.1 g/dL (ref 32.0–36.0)
MCV: 92.3 fL (ref 79.3–98.0)
MONO ABS: 0.9 10*3/uL (ref 0.1–0.9)
Monocytes Relative: 14 %
NEUTROS ABS: 3.9 10*3/uL (ref 1.5–6.5)
Neutrophils Relative %: 65 %
PLATELETS: 248 10*3/uL (ref 140–400)
RBC: 4.39 MIL/uL (ref 4.20–5.82)
RDW: 14.2 % (ref 11.0–14.6)
WBC: 6.1 10*3/uL (ref 4.0–10.3)

## 2018-01-03 LAB — TSH: TSH: 3.554 u[IU]/mL (ref 0.320–4.118)

## 2018-01-03 MED ORDER — SODIUM CHLORIDE 0.9 % IV SOLN
Freq: Once | INTRAVENOUS | Status: AC
  Administered 2018-01-03: 12:00:00 via INTRAVENOUS

## 2018-01-03 MED ORDER — HYDROCODONE-ACETAMINOPHEN 5-325 MG PO TABS: 1.0000 | ORAL_TABLET | Freq: Four times a day (QID) | ORAL | 0 refills | Status: DC | PRN

## 2018-01-03 MED ORDER — NIVOLUMAB CHEMO INJECTION 100 MG/10ML
480.0000 mg | Freq: Once | INTRAVENOUS | Status: AC
  Administered 2018-01-03: 480 mg via INTRAVENOUS
  Filled 2018-01-03: qty 48

## 2018-01-03 NOTE — Progress Notes (Signed)
[place] [phone]   [phone]  OFFICE PROGRESS NOTE  [name], MD [place] [id number]  DIAGNOSIS: Stage IV (T1a, N0, M1b) non-small cell lung cancer, adenocarcinoma of the right lower lobe diagnosed in October 2014  Molecular profile: Negative forRET, ALK, BRAF, MET, EGFR.  Positive for ERBB2 A775T, [id number], KRAS G13E, MAP2K1 D67N (see full report).   PRIOR THERAPY:  1) Systemic chemotherapy with carboplatin for AUC of 5 and Alimta 500 mg/M2 every 3 weeks, status post 6 cycles with stable disease. First dose on 10/23/2013. 2) Maintenance systemic chemotherapy with single agent Alimta 500 mg/M2 every 3 weeks. Status post 8 cycles.  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 Sondra Come completed on 12/16/2014. 4) stereotactic radiotherapy to the enlarging right upper lobe lung nodule under the care of Dr. Sondra Come completed on 11/22/2016. 5) Immunotherapy with Nivolumab 240 mg IV every 2 weeks, status post 60 cycles. First cycle was given on 01/28/2015. Last dose 07/05/2017.  CURRENT THERAPY: Immunotherapy with Nivolumab 480 mg IV every 4 weeks. First dose 07/19/2017. Status post 6 cycles.  INTERVAL HISTORY: Anthony Curry. 77 y.o. male returns to the clinic today for follow-up visit accompanied by his wife.  The patient is feeling fine today with no specific complaints except for the mild fatigue.  He denied having any significant chest pain, shortness of breath, cough or hemoptysis.  He denied having any fever or chills.  He has no nausea, vomiting, diarrhea or constipation.  He is here today for evaluation before starting cycle #7 of his treatment.   MEDICAL HISTORY: Past Medical History:  Diagnosis Date  . 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 Adrenal mass ([place])   . Alcoholism in remission ([place])   . Anemia of chronic disease   . Antineoplastic chemotherapy  induced anemia(285.3)   . Anxiety   . BPH (benign prostatic hyperplasia)   . Cancer ([place])    small cell/lung  . Complication of anesthesia 09/08/2015    JITTERY AFTER GALLBLADDER SURGERY  . GERD (gastroesophageal reflux disease)   . History of radiation therapy 11/14/16, 11/18/16, 11/22/16   Left upper lung: 54 Gy in 3 fractions  . Hypertension   . Hypertension 12/07/2016  . Neoplastic malignant related fatigue    IV tx. every 2 weeks last9-14-16 (Dr. Earlie Server), radiation last tx. 6 months  . 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 Persistent atrial fibrillation ([place])   . Pulmonary nodules   . Radiation 12/02/14-12/16/14   Left adrenal gland 30 Gy in 10 fractions    ALLERGIES:  has No Known Allergies.  MEDICATIONS:  Current Outpatient Medications  Medication Sig Dispense Refill  . ALPRAZolam (XANAX) 0.25 MG tablet Take 1 tablet (0.25 mg total) by mouth 2 (two) times daily as needed for anxiety. And also at night for sleep 60 tablet 0  . apixaban (ELIQUIS) 5 MG TABS tablet Take 5 mg by mouth 2 (two) times daily.    . Ascorbic Acid (VITAMIN C PO) Take 1,000 mg by mouth daily.     . B Complex-C (SUPER B COMPLEX PO) Take 1 each by mouth daily.     . calcium carbonate (OS-CAL) 600 MG TABS Take 600 mg by mouth daily with breakfast.    . demeclocycline (DECLOMYCIN) 150 MG tablet Take 150 mg by mouth 2 (two) times daily.    [name] diltiazem (CARDIZEM CD) 180 MG  24 hr capsule Take 1 capsule (180 mg total) by mouth daily at 12 noon. 90 capsule 3  . furosemide (LASIX) 40 MG tablet Take 0.5 tablets (20 mg total) by mouth every other day.    [name] HYDROcodone-acetaminophen (NORCO/VICODIN) 5-325 MG tablet Take 1-2 tablets by mouth every 4 (four) hours as needed for moderate pain. 30 tablet 0  . KLOR-CON M20 20 MEQ tablet Take 20 mEq by mouth every morning.     . Lactobacillus (ACIDOPHILUS) CAPS capsule Take 1 capsule by mouth daily.    [name] levothyroxine (SYNTHROID, LEVOTHROID) 50 MCG tablet Take 1  tablet (50 mcg total) by mouth daily before breakfast. 90 tablet 0  . MAGNESIUM PO Take 400 mg by mouth daily.     . Multiple Vitamin (MULTIVITAMIN WITH MINERALS) TABS Take 1 tablet by mouth daily.    . Nivolumab (OPDIVO IV) Inject as directed every 2 weeks    . Nutritional Supplements (ENSURE COMPLETE PO) Take 1 Can by mouth 2 (two) times daily.    . Omega-3 Fatty Acids (FISH OIL PO) Take 1 tablet by mouth daily.    . pantoprazole (PROTONIX) 40 MG tablet Take 40 mg by mouth every morning.  8  . tamsulosin (FLOMAX) 0.4 MG CAPS capsule Take 0.4 mg by mouth daily at 12 noon.     . thiamine (VITAMIN B-1) 100 MG tablet Take 1 tablet (100 mg total) by mouth daily. 30 tablet 6  . vitamin E 400 UNIT capsule Take 400 Units by mouth daily.     No current facility-administered medications for this visit.     SURGICAL HISTORY:  Past Surgical History:  Procedure Laterality Date  . ANKLE ARTHRODESIS  1995   rt fx-hardware in  . CARDIOVERSION N/A 09/29/2014   Procedure: CARDIOVERSION;  Surgeon: Josue Hector, MD;  Location: [facility] ENDOSCOPY;  Service: Cardiovascular;  Laterality: N/A;  . CHOLECYSTECTOMY  09/08/2015    laproscopic   . CHOLECYSTECTOMY N/A 09/08/2015   Procedure: LAPAROSCOPIC CHOLECYSTECTOMY WITH ATTEMPTED INTRAOPERATIVE CHOLANGIOGRAM ;  Surgeon: Fanny Skates, MD;  Location: [place];  Service: General;  Laterality: N/A;  . COLONOSCOPY    . ERCP N/A 09/02/2015   Procedure: ENDOSCOPIC RETROGRADE CHOLANGIOPANCREATOGRAPHY (ERCP);  Surgeon: Arta Silence, MD;  Location: [facility] ENDOSCOPY;  Service: Endoscopy;  Laterality: N/A;  . ERCP N/A 09/07/2015   Procedure: ENDOSCOPIC RETROGRADE CHOLANGIOPANCREATOGRAPHY (ERCP);  Surgeon: Clarene Essex, MD;  Location: [facility] ENDOSCOPY;  Service: Endoscopy;  Laterality: N/A;  . EXCISION / CURETTAGE BONE CYST FINGER  2005   rt thumb  . HEMORRHOID SURGERY    . Percutaneous Cholecytostomy tube     IVR insertion 06-13-15(Dr. Vernard Gambles)- 08-28-15 remains in place to drainage  bag  . TONSILLECTOMY    . TOTAL SHOULDER ARTHROPLASTY  02/03/2012   Procedure: TOTAL SHOULDER ARTHROPLASTY;  Surgeon: [name], MD;  Location: [place];  Service: Orthopedics;  Laterality: Left;    REVIEW OF SYSTEMS:  A comprehensive review of systems was negative except for: Constitutional: positive for fatigue Neurological: positive for paresthesia   PHYSICAL EXAMINATION: General appearance: alert, cooperative and no distress Head: Normocephalic, without obvious abnormality, atraumatic Neck: no adenopathy, no JVD, supple, symmetrical, trachea midline and thyroid not enlarged, symmetric, no tenderness/mass/nodules Lymph nodes: Cervical, supraclavicular, and axillary nodes normal. Resp: clear to auscultation bilaterally Back: symmetric, no curvature. ROM normal. No CVA tenderness. Cardio: regular rate and rhythm, S1, S2 normal, no murmur, click, rub or gallop GI: soft, non-tender; bowel sounds normal; no masses,  no organomegaly Extremities: extremities normal, atraumatic, no cyanosis or edema  ECOG PERFORMANCE STATUS: 1 - Symptomatic but completely ambulatory  Blood pressure 129/61, pulse 80, temperature 98 F (36.7 C), temperature source Oral, resp. rate 15, height 5' 11"  (1.803 m), weight 151 lb 1.6 oz (68.5 kg), SpO2 92 %.  LABORATORY DATA: Lab Results  Component Value Date   WBC 6.1 01/03/2018   HGB 14.0 12/06/2017   HCT 40.5 01/03/2018   MCV 92.3 01/03/2018   PLT 248 01/03/2018      Chemistry      Component Value Date/Time   NA 131 (L) 01/03/2018 0933   NA 132 (L) 12/06/2017 0930   K 4.1 01/03/2018 0933   K 4.0 12/06/2017 0930   CL 97 (L) 01/03/2018 0933   CO2 25 01/03/2018 0933   CO2 26 12/06/2017 0930   BUN 22 01/03/2018 0933   BUN 15.4 12/06/2017 0930   CREATININE 0.9 12/06/2017 0930      Component Value Date/Time   CALCIUM 9.8 01/03/2018 0933   CALCIUM 9.9 12/06/2017 0930   ALKPHOS 97 01/03/2018 0933   ALKPHOS 107 12/06/2017 0930    AST 21 01/03/2018 0933   AST 20 12/06/2017 0930   ALT 11 01/03/2018 0933   ALT 14 12/06/2017 0930   BILITOT 0.7 01/03/2018 0933   BILITOT 0.76 12/06/2017 0930       RADIOGRAPHIC STUDIES: No results found.  ASSESSMENT AND PLAN:  This is a very pleasant 77 years old white male with metastatic non-small cell lung cancer, adenocarcinoma and currently on treatment with second line immunotherapy with Nivolumab status post 60 cycles and has been tolerating his treatment well. He was recently switched to Nivolumab 480 MG IV every 4 weeks status post 6 cycles. He continues to tolerate this treatment fairly well with no concerning complaints. I recommended for him to proceed with cycle #7 today as a schedule. I will see him back for follow-up visit in 1 month for evaluation after repeating CT scan of the chest, abdomen and pelvis for restaging of his disease. He was given refill for Xanax and hydrocodone today. For hypothyroidism, the patient will continue his current treat ment with levothyroxine. The patient was advised to call immediately if he has any concerning symptoms in the interval. The patient voices understanding of current disease status and treatment options and is in agreement with the current care plan. All questions were answered. The patient knows to call the clinic with any problems, questions or concerns. We can certainly see the patient much sooner if necessary.  Disclaimer: This note was dictated with voice recognition software. Similar sounding words can inadvertently be transcribed and may not be corrected upon review.

## 2018-01-03 NOTE — Telephone Encounter (Signed)
Scheduled apt per 1/30 los - Patient to get an updated schedule next visit.

## 2018-01-03 NOTE — Patient Instructions (Signed)
[facility] Discharge Instructions for Patients Receiving Chemotherapy  Today you received the following chemotherapy agents:  Nivolumab.  To help prevent nausea and vomiting after your treatment, we encourage you to take your nausea medication as directed.   If you develop nausea and vomiting that is not controlled by your nausea medication, call the clinic.   BELOW ARE SYMPTOMS THAT SHOULD BE REPORTED IMMEDIATELY:  *FEVER GREATER THAN 100.5 F  *CHILLS WITH OR WITHOUT FEVER  NAUSEA AND VOMITING THAT IS NOT CONTROLLED WITH YOUR NAUSEA MEDICATION  *UNUSUAL SHORTNESS OF BREATH  *UNUSUAL BRUISING OR BLEEDING  TENDERNESS IN MOUTH AND THROAT WITH OR WITHOUT PRESENCE OF ULCERS  *URINARY PROBLEMS  *BOWEL PROBLEMS  UNUSUAL RASH Items with * indicate a potential emergency and should be followed up as soon as possible.  Feel free to call the clinic you have any questions or concerns. The clinic phone number is [phone].  Please show the CHEMO ALERT CARD at check-in to the Emergency Department and triage nurse.   

## 2018-01-05 DIAGNOSIS — H353221 Exudative age-related macular degeneration, left eye, with active choroidal neovascularization: Secondary | ICD-10-CM | POA: Diagnosis not present

## 2018-01-29 ENCOUNTER — Encounter (HOSPITAL_COMMUNITY): Payer: Self-pay

## 2018-01-29 ENCOUNTER — Ambulatory Visit (HOSPITAL_COMMUNITY)
Admission: RE | Admit: 2018-01-29 | Discharge: 2018-01-29 | Disposition: A | Discharge status: Home / Self Care | Payer: Medicare Other | Source: Ambulatory Visit | Attending: Internal Medicine | Admitting: Internal Medicine

## 2018-01-29 DIAGNOSIS — J439 Emphysema, unspecified: Secondary | ICD-10-CM | POA: Diagnosis not present

## 2018-01-29 DIAGNOSIS — C3490 Malignant neoplasm of unspecified part of unspecified bronchus or lung: Secondary | ICD-10-CM | POA: Insufficient documentation

## 2018-01-29 DIAGNOSIS — I2584 Coronary atherosclerosis due to calcified coronary lesion: Secondary | ICD-10-CM | POA: Diagnosis not present

## 2018-01-29 DIAGNOSIS — I700 Atherosclerosis of aorta: Secondary | ICD-10-CM | POA: Insufficient documentation

## 2018-01-29 MED ORDER — IOPAMIDOL (ISOVUE-300) INJECTION 61%
100.0000 mL | Freq: Once | INTRAVENOUS | Status: AC | PRN
  Administered 2018-01-29: 100 mL via INTRAVENOUS

## 2018-01-29 MED ORDER — IOPAMIDOL (ISOVUE-300) INJECTION 61%
INTRAVENOUS | Status: AC
  Filled 2018-01-29: qty 100

## 2018-01-30 ENCOUNTER — Other Ambulatory Visit: Payer: Self-pay | Admitting: Medical Oncology

## 2018-01-30 DIAGNOSIS — C3490 Malignant neoplasm of unspecified part of unspecified bronchus or lung: Secondary | ICD-10-CM

## 2018-01-31 ENCOUNTER — Inpatient Hospital Stay: Payer: Medicare Other | Attending: Internal Medicine

## 2018-01-31 ENCOUNTER — Inpatient Hospital Stay: Payer: Medicare Other

## 2018-01-31 ENCOUNTER — Encounter: Payer: Self-pay | Admitting: Internal Medicine

## 2018-01-31 ENCOUNTER — Telehealth: Payer: Self-pay | Admitting: Internal Medicine

## 2018-01-31 ENCOUNTER — Inpatient Hospital Stay (HOSPITAL_BASED_OUTPATIENT_CLINIC_OR_DEPARTMENT_OTHER): Payer: Medicare Other | Admitting: Internal Medicine

## 2018-01-31 VITALS — BP 127/69 | HR 81 | Temp 97.1°F | Resp 16 | Wt 148.7 lb

## 2018-01-31 DIAGNOSIS — C7972 Secondary malignant neoplasm of left adrenal gland: Secondary | ICD-10-CM | POA: Insufficient documentation

## 2018-01-31 DIAGNOSIS — Z5112 Encounter for antineoplastic immunotherapy: Secondary | ICD-10-CM

## 2018-01-31 DIAGNOSIS — C3431 Malignant neoplasm of lower lobe, right bronchus or lung: Secondary | ICD-10-CM | POA: Diagnosis not present

## 2018-01-31 DIAGNOSIS — Z79899 Other long term (current) drug therapy: Secondary | ICD-10-CM | POA: Insufficient documentation

## 2018-01-31 DIAGNOSIS — R52 Pain, unspecified: Secondary | ICD-10-CM

## 2018-01-31 DIAGNOSIS — C3490 Malignant neoplasm of unspecified part of unspecified bronchus or lung: Secondary | ICD-10-CM

## 2018-01-31 DIAGNOSIS — R5382 Chronic fatigue, unspecified: Secondary | ICD-10-CM

## 2018-01-31 DIAGNOSIS — G893 Neoplasm related pain (acute) (chronic): Secondary | ICD-10-CM

## 2018-01-31 LAB — CMP (CANCER CENTER ONLY)
ALK PHOS: 99 U/L (ref 40–150)
ALT: 15 U/L (ref 0–55)
AST: 22 U/L (ref 5–34)
Albumin: 3.6 g/dL (ref 3.5–5.0)
Anion gap: 11 (ref 3–11)
BILIRUBIN TOTAL: 0.7 mg/dL (ref 0.2–1.2)
BUN: 17 mg/dL (ref 7–26)
CHLORIDE: 96 mmol/L — AB (ref 98–109)
CO2: 26 mmol/L (ref 22–29)
CREATININE: 0.82 mg/dL (ref 0.70–1.30)
Calcium: 10.3 mg/dL (ref 8.4–10.4)
GFR, Est AFR Am: >60 mL/min (ref >60)
Glucose, Bld: 90 mg/dL (ref 70–140)
Potassium: 3.8 mmol/L (ref 3.5–5.1)
Sodium: 133 mmol/L — ABNORMAL LOW (ref 136–145)
Total Protein: 6.4 g/dL (ref 6.4–8.3)

## 2018-01-31 LAB — CBC WITH DIFFERENTIAL (CANCER CENTER ONLY)
BASOS ABS: 0 10*3/uL (ref 0.0–0.1)
Basophils Relative: 1 %
Eosinophils Absolute: 0.3 10*3/uL (ref 0.0–0.5)
Eosinophils Relative: 5 %
HEMATOCRIT: 42.9 % (ref 38.4–49.9)
HEMOGLOBIN: 14.9 g/dL (ref 13.0–17.1)
Lymphocytes Relative: 11 %
Lymphs Abs: 0.7 10*3/uL — ABNORMAL LOW (ref 0.9–3.3)
MCH: 32 pg (ref 27.2–33.4)
MCHC: 34.7 g/dL (ref 32.0–36.0)
MCV: 92.1 fL (ref 79.3–98.0)
Monocytes Absolute: 0.9 10*3/uL (ref 0.1–0.9)
Monocytes Relative: 14 %
NEUTROS ABS: 4.6 10*3/uL (ref 1.5–6.5)
NEUTROS PCT: 71 %
PLATELETS: 268 10*3/uL (ref 140–400)
RBC: 4.66 MIL/uL (ref 4.20–5.82)
RDW: 13.8 % (ref 11.0–14.6)
WBC: 6.6 10*3/uL (ref 4.0–10.3)

## 2018-01-31 LAB — TSH: TSH: 3.538 u[IU]/mL (ref 0.320–4.118)

## 2018-01-31 MED ORDER — HYDROCODONE-ACETAMINOPHEN 5-325 MG PO TABS: 1.0000 | ORAL_TABLET | Freq: Four times a day (QID) | ORAL | 0 refills | Status: DC | PRN

## 2018-01-31 MED ORDER — SODIUM CHLORIDE 0.9 % IV SOLN
480.0000 mg | Freq: Once | INTRAVENOUS | Status: AC
  Administered 2018-01-31: 480 mg via INTRAVENOUS
  Filled 2018-01-31: qty 48

## 2018-01-31 MED ORDER — SODIUM CHLORIDE 0.9 % IV SOLN
Freq: Once | INTRAVENOUS | Status: AC
  Administered 2018-01-31: 11:00:00 via INTRAVENOUS

## 2018-01-31 NOTE — Telephone Encounter (Signed)
Scheduled appt per 2/27 los - patient to get an updated schedule in tx area.

## 2018-01-31 NOTE — Progress Notes (Signed)
[facility] [phone]   [phone]  OFFICE PROGRESS NOTE  [name] [id number]  DIAGNOSIS: Stage IV (T1a, N0, M1b) non-small cell lung cancer, adenocarcinoma of the right lower lobe diagnosed in October 2014  Molecular profile: Negative forRET, ALK, BRAF, MET, EGFR.  Positive for ERBB2 A775T, [id number], KRAS G13E, MAP2K1 D67N (see full report).   PRIOR THERAPY:  1) Systemic chemotherapy with carboplatin for AUC of 5 and Alimta 500 mg/M2 every 3 weeks, status post 6 cycles with stable disease. First dose on 10/23/2013. 2) Maintenance systemic chemotherapy with single agent Alimta 500 mg/M2 every 3 weeks. Status post 8 cycles.  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 Sondra Come completed on 12/16/2014. 4) stereotactic radiotherapy to the enlarging right upper lobe lung nodule under the care of Dr. Sondra Come completed on 11/22/2016. 5) Immunotherapy with Nivolumab 240 mg IV every 2 weeks, status post 60 cycles. First cycle was given on 01/28/2015. Last dose 07/05/2017.  CURRENT THERAPY: Immunotherapy with Nivolumab 480 mg IV every 4 weeks. First dose 07/19/2017. Status post 7 cycles.  INTERVAL HISTORY: Anthony Curry. 77 y.o. male returns to the clinic today for follow-up visit.  The patient is feeling fine today with no specific complaints except for mild fatigue and lack of taste.  He denied having any significant weight loss or night sweats.  He has no nausea, vomiting, diarrhea or constipation.  The patient has no chest pain, shortness breath, cough or hemoptysis.  He has no fever or chills.  He is here today for evaluation after repeating CT scan of the chest, abdomen and pelvis for restaging of his disease.   MEDICAL HISTORY: Past Medical History:  Diagnosis Date  . 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[place])   . Adrenal mass ([place])   . Alcoholism in remission ([place])     . Anemia of chronic disease   . 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 Anxiety   . BPH (benign prostatic hyperplasia)   . Cancer ([place])    small cell/lung  . Complication of anesthesia 09/08/2015    JITTERY AFTER GALLBLADDER SURGERY  . GERD (gastroesophageal reflux disease)   . History of radiation therapy 11/14/16, 11/18/16, 11/22/16   Left upper lung: 54 Gy in 3 fractions  . Hypertension   . Hypertension 12/07/2016  . Neoplastic malignant related fatigue    IV tx. every 2 weeks last9-14-16 (Dr. Earlie Server), radiation last tx. 6 months  . 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 Persistent atrial fibrillation ([place])   . Pulmonary nodules   . Radiation 12/02/14-12/16/14   Left adrenal gland 30 Gy in 10 fractions    ALLERGIES:  has No Known Allergies.  MEDICATIONS:  Current Outpatient Medications  Medication Sig Dispense Refill  . ALPRAZolam (XANAX) 0.25 MG tablet Take 1 tablet (0.25 mg total) by mouth 2 (two) times daily as needed for anxiety. And also at night for sleep 60 tablet 0  . apixaban (ELIQUIS) 5 MG TABS tablet Take 5 mg by mouth 2 (two) times daily.    . Ascorbic Acid (VITAMIN C PO) Take 1,000 mg by mouth daily.     . B Complex-C (SUPER B COMPLEX PO) Take 1 each by mouth daily.     . calcium carbonate (OS-CAL) 600 MG TABS Take 600 mg by mouth daily with breakfast.    . demeclocycline (DECLOMYCIN) 150 MG tablet Take 150  mg by mouth 2 (two) times daily.    [name] diltiazem (CARDIZEM CD) 180 MG 24 hr capsule Take 1 capsule (180 mg total) by mouth daily at 12 noon. 90 capsule 3  . furosemide (LASIX) 40 MG tablet Take 0.5 tablets (20 mg total) by mouth every other day.    [name] HYDROcodone-acetaminophen (NORCO/VICODIN) 5-325 MG tablet Take 1 tablet by mouth every 6 (six) hours as needed for moderate pain. 30 tablet 0  . KLOR-CON M20 20 MEQ tablet Take 20 mEq by mouth every morning.     . Lactobacillus (ACIDOPHILUS) CAPS capsule Take 1 capsule by mouth daily.    [name]  levothyroxine (SYNTHROID, LEVOTHROID) 50 MCG tablet Take 1 tablet (50 mcg total) by mouth daily before breakfast. 90 tablet 0  . MAGNESIUM PO Take 400 mg by mouth daily.     . Multiple Vitamin (MULTIVITAMIN WITH MINERALS) TABS Take 1 tablet by mouth daily.    . Nivolumab (OPDIVO IV) Inject as directed every 2 weeks    . Nutritional Supplements (ENSURE COMPLETE PO) Take 1 Can by mouth 2 (two) times daily.    . Omega-3 Fatty Acids (FISH OIL PO) Take 1 tablet by mouth daily.    . pantoprazole (PROTONIX) 40 MG tablet Take 40 mg by mouth every morning.  8  . tamsulosin (FLOMAX) 0.4 MG CAPS capsule Take 0.4 mg by mouth daily at 12 noon.     . thiamine (VITAMIN B-1) 100 MG tablet Take 1 tablet (100 mg total) by mouth daily. 30 tablet 6  . vitamin E 400 UNIT capsule Take 400 Units by mouth daily.     No current facility-administered medications for this visit.     SURGICAL HISTORY:  Past Surgical History:  Procedure Laterality Date  . ANKLE ARTHRODESIS  1995   rt fx-hardware in  . CARDIOVERSION N/A 09/29/2014   Procedure: CARDIOVERSION;  Surgeon: Josue Hector, MD;  Location: [facility] ENDOSCOPY;  Service: Cardiovascular;  Laterality: N/A;  . CHOLECYSTECTOMY  09/08/2015    laproscopic   . CHOLECYSTECTOMY N/A 09/08/2015   Procedure: LAPAROSCOPIC CHOLECYSTECTOMY WITH ATTEMPTED INTRAOPERATIVE CHOLANGIOGRAM ;  Surgeon: Fanny Skates, MD;  Location: [facility];  Service: General;  Laterality: N/A;  . COLONOSCOPY    . ERCP N/A 09/02/2015   Procedure: ENDOSCOPIC RETROGRADE CHOLANGIOPANCREATOGRAPHY (ERCP);  Surgeon: Arta Silence, MD;  Location: [facility];  Service: Endoscopy;  Laterality: N/A;  . ERCP N/A 09/07/2015   Procedure: ENDOSCOPIC RETROGRADE CHOLANGIOPANCREATOGRAPHY (ERCP);  Surgeon: Clarene Essex, MD;  Location: [facility];  Service: Endoscopy;  Laterality: N/A;  . EXCISION / CURETTAGE BONE CYST FINGER  2005   rt thumb  . HEMORRHOID SURGERY    . Percutaneous Cholecytostomy tube     IVR insertion  06-13-15(Dr. Vernard Gambles)- 08-28-15 remains in place to drainage bag  . TONSILLECTOMY    . TOTAL SHOULDER ARTHROPLASTY  02/03/2012   Procedure: TOTAL SHOULDER ARTHROPLASTY;  Surgeon: [name], MD;  Location: [facility];  Service: Orthopedics;  Laterality: Left;    REVIEW OF SYSTEMS:  Constitutional: positive for fatigue Eyes: negative Ears, nose, mouth, throat, and face: negative Respiratory: negative Cardiovascular: negative Gastrointestinal: negative Genitourinary:negative Integument/breast: negative Hematologic/lymphatic: negative Musculoskeletal:negative Neurological: negative Behavioral/Psych: negative Endocrine: negative Allergic/Immunologic: negative   PHYSICAL EXAMINATION: General appearance: alert, cooperative and no distress Head: Normocephalic, without obvious abnormality, atraumatic Neck: no adenopathy, no JVD, supple, symmetrical, trachea midline and thyroid not enlarged, symmetric, no tenderness/mass/nodules Lymph nodes: Cervical, supraclavicular, and axillary nodes normal. Resp: clear to auscultation bilaterally  Back: symmetric, no curvature. ROM normal. No CVA tenderness. Cardio: regular rate and rhythm, S1, S2 normal, no murmur, click, rub or gallop GI: soft, non-tender; bowel sounds normal; no masses,  no organomegaly Extremities: extremities normal, atraumatic, no cyanosis or edema Neurologic: Alert and oriented X 3, normal strength and tone. Normal symmetric reflexes. Normal coordination and gait  ECOG PERFORMANCE STATUS: 1 - Symptomatic but completely ambulatory  Blood pressure 127/69, pulse 81, temperature (!) 97.1 F (36.2 C), temperature source Oral, resp. rate 16, weight 148 lb 11.2 oz (67.4 kg), SpO2 95 %.  LABORATORY DATA: Lab Results  Component Value Date   WBC 6.6 01/31/2018   HGB 14.0 12/06/2017   HCT 42.9 01/31/2018   MCV 92.1 01/31/2018   PLT 268 01/31/2018      Chemistry      Component Value Date/Time   NA 131 (L)  01/03/2018 0933   NA 132 (L) 12/06/2017 0930   K 4.1 01/03/2018 0933   K 4.0 12/06/2017 0930   CL 97 (L) 01/03/2018 0933   CO2 25 01/03/2018 0933   CO2 26 12/06/2017 0930   BUN 22 01/03/2018 0933   BUN 15.4 12/06/2017 0930   CREATININE 0.87 01/03/2018 0933   CREATININE 0.9 12/06/2017 0930      Component Value Date/Time   CALCIUM 9.8 01/03/2018 0933   CALCIUM 9.9 12/06/2017 0930   ALKPHOS 97 01/03/2018 0933   ALKPHOS 107 12/06/2017 0930   AST 21 01/03/2018 0933   AST 20 12/06/2017 0930   ALT 11 01/03/2018 0933   ALT 14 12/06/2017 0930   BILITOT 0.7 01/03/2018 0933   BILITOT 0.76 12/06/2017 0930       RADIOGRAPHIC STUDIES: Ct Chest W Contrast  Result Date: 01/29/2018 CLINICAL DATA:  Non-small-cell lung cancer.  Restaging EXAM: CT CHEST, ABDOMEN, AND PELVIS WITH CONTRAST TECHNIQUE: Multidetector CT imaging of the chest, abdomen and pelvis was performed following the standard protocol during bolus administration of intravenous contrast. CONTRAST:  124m ISOVUE-300 IOPAMIDOL (ISOVUE-300) INJECTION 61% COMPARISON:  11/06/2017 FINDINGS: CT CHEST FINDINGS Cardiovascular: The heart size appears normal. There is a small pericardial effusion. Similar to previous exam. Aortic atherosclerosis. Calcification within the RCA, LAD and left circumflex coronary arteries noted. Mediastinum/Nodes: The trachea is patent and midline. Normal appearance of the esophagus. Normal appearance of the thyroid gland. No enlarged mediastinal or hilar lymph nodes. Lungs/Pleura: Moderate changes of emphysema are again noted. Diffuse bronchial wall thickening noted. Similar appearance of small left effusion. Previously noted bilateral posterior lower lobe airspace consolidation has nearly completely resolved. There is new airspace consolidation within the lingula, image 100 of series 4. There is also new airspace consolidation within the lateral aspect of the right middle lobe, image 110/4. Multifocal sub solid lesions  are again noted within both upper lobes. Within the posterior right upper lobe the sub solid lesion measures 2.5 cm, image number 59 of series 4. Left upper lobe ground-glass nodule measures 2.1 cm, image 60 of series 4. Unchanged. Stable faint ground-glass nodule within the left upper lobe measuring 1.5 cm, image 56 of series 4. New right lower lobe nodular density with surrounding scar like opacity measures 1.2 cm, image 121 of series 4. Adjacent nodular density within the anterior right base measures 1.5 cm, image 54 of series 2. Presumed area of radiation change within the medial left apex is stable, image 35 of series 4. Musculoskeletal: Previous left shoulder arthroplasty. Chronic right posterior rib deformities are again noted. Degenerative disc disease noted within the  thoracic spine. CT ABDOMEN PELVIS FINDINGS Hepatobiliary: Stable 7 mm hyperdense high right liver lobe lesion is unchanged from previous exam, image 57 of series 2. Scattered liver cysts appears stable. No suspicious liver abnormality. Previous cholecystectomy. No biliary dilatation. Pancreas: Unremarkable. No pancreatic ductal dilatation or surrounding inflammatory changes. Spleen: Normal in size without focal abnormality. Adrenals/Urinary Tract: Normal appearance of the left adrenal gland. Right adrenal nodule is unchanged measuring 1.2 cm. Bilateral renal cortical scarring is again noted. Urinary bladder is unremarkable. Stomach/Bowel: Stomach is within normal limits. No evidence of bowel wall thickening, distention, or inflammatory changes. Vascular/Lymphatic: Aortic atherosclerosis. No aneurysm. No upper abdominal or pelvic adenopathy. No inguinal adenopathy. Reproductive: Normal prostate. Other: There is no ascites or focal fluid collections within the abdomen or pelvis. Musculoskeletal: Presumed bone island within the left iliac bone. Unchanged lucent lesion in the right iliac bone measuring 1 cm. Advanced lumbar spondylosis.  IMPRESSION: 1. Since the previous exam there has been interval development of dense airspace consolidation involving the lingula and right middle lobe. Favor inflammatory or infectious process. Short-term follow-up imaging advised to ensure resolution. 2. Interval improvement in bilateral posterior lower lobe airspace consolidation. There are 2 indeterminate nodular densities identified within the right lower lobe which may be postinflammatory or infectious. Recurrent tumor not excluded. Attention on short-term follow-up imaging advised. 3. Stable bilateral upper lobe sub solid lesions. 4. Aortic Atherosclerosis (ICD10-I70.0) and Emphysema (ICD10-J43.9). 5. Three vessel coronary artery atherosclerotic calcifications. Electronically Signed   By: Kerby Moors M.D.   On: 01/29/2018 14:57   Ct Abdomen Pelvis W Contrast  Result Date: 01/29/2018 CLINICAL DATA:  Non-small-cell lung cancer.  Restaging EXAM: CT CHEST, ABDOMEN, AND PELVIS WITH CONTRAST TECHNIQUE: Multidetector CT imaging of the chest, abdomen and pelvis was performed following the standard protocol during bolus administration of intravenous contrast. CONTRAST:  1107m ISOVUE-300 IOPAMIDOL (ISOVUE-300) INJECTION 61% COMPARISON:  11/06/2017 FINDINGS: CT CHEST FINDINGS Cardiovascular: The heart size appears normal. There is a small pericardial effusion. Similar to previous exam. Aortic atherosclerosis. Calcification within the RCA, LAD and left circumflex coronary arteries noted. Mediastinum/Nodes: The trachea is patent and midline. Normal appearance of the esophagus. Normal appearance of the thyroid gland. No enlarged mediastinal or hilar lymph nodes. Lungs/Pleura: Moderate changes of emphysema are again noted. Diffuse bronchial wall thickening noted. Similar appearance of small left effusion. Previously noted bilateral posterior lower lobe airspace consolidation has nearly completely resolved. There is new airspace consolidation within the lingula, image  100 of series 4. There is also new airspace consolidation within the lateral aspect of the right middle lobe, image 110/4. Multifocal sub solid lesions are again noted within both upper lobes. Within the posterior right upper lobe the sub solid lesion measures 2.5 cm, image number 59 of series 4. Left upper lobe ground-glass nodule measures 2.1 cm, image 60 of series 4. Unchanged. Stable faint ground-glass nodule within the left upper lobe measuring 1.5 cm, image 56 of series 4. New right lower lobe nodular density with surrounding scar like opacity measures 1.2 cm, image 121 of series 4. Adjacent nodular density within the anterior right base measures 1.5 cm, image 54 of series 2. Presumed area of radiation change within the medial left apex is stable, image 35 of series 4. Musculoskeletal: Previous left shoulder arthroplasty. Chronic right posterior rib deformities are again noted. Degenerative disc disease noted within the thoracic spine. CT ABDOMEN PELVIS FINDINGS Hepatobiliary: Stable 7 mm hyperdense high right liver lobe lesion is unchanged from previous exam, image 57 of  series 2. Scattered liver cysts appears stable. No suspicious liver abnormality. Previous cholecystectomy. No biliary dilatation. Pancreas: Unremarkable. No pancreatic ductal dilatation or surrounding inflammatory changes. Spleen: Normal in size without focal abnormality. Adrenals/Urinary Tract: Normal appearance of the left adrenal gland. Right adrenal nodule is unchanged measuring 1.2 cm. Bilateral renal cortical scarring is again noted. Urinary bladder is unremarkable. Stomach/Bowel: Stomach is within normal limits. No evidence of bowel wall thickening, distention, or inflammatory changes. Vascular/Lymphatic: Aortic atherosclerosis. No aneurysm. No upper abdominal or pelvic adenopathy. No inguinal adenopathy. Reproductive: Normal prostate. Other: There is no ascites or focal fluid collections within the abdomen or pelvis. Musculoskeletal:  Presumed bone island within the left iliac bone. Unchanged lucent lesion in the right iliac bone measuring 1 cm. Advanced lumbar spondylosis. IMPRESSION: 1. Since the previous exam there has been interval development of dense airspace consolidation involving the lingula and right middle lobe. Favor inflammatory or infectious process. Short-term follow-up imaging advised to ensure resolution. 2. Interval improvement in bilateral posterior lower lobe airspace consolidation. There are 2 indeterminate nodular densities identified within the right lower lobe which may be postinflammatory or infectious. Recurrent tumor not excluded. Attention on short-term follow-up imaging advised. 3. Stable bilateral upper lobe sub solid lesions. 4. Aortic Atherosclerosis (ICD10-I70.0) and Emphysema (ICD10-J43.9). 5. Three vessel coronary artery atherosclerotic calcifications. Electronically Signed   By: Kerby Moors M.D.   On: 01/29/2018 14:57    ASSESSMENT AND PLAN:  This is a very pleasant 77 years old white male with metastatic non-small cell lung cancer, adenocarcinoma and currently on treatment with second line immunotherapy with Nivolumab status post 60 cycles and has been tolerating his treatment well. He was recently switched to Nivolumab 480 MG IV every 4 weeks status post 7 cycles. The patient continues to tolerate the treatment well with no concerning complaints. He had a repeat CT scan of the chest, abdomen and pelvis performed recently.  His scan showed no concerning findings for disease progression.  He has airspace disease but the patient is asymptomatic and denied having any significant shortness of breath, fever or chills. I discussed the scan results with the patient and recommended for him to continue his current treatment with Nivolumab and he will proceed with cycle #8 today. For pain management, I gave him a refill of Vicodin today. He will come back for follow-up visit in 4 weeks for evaluation for  the next cycle of his treatment. The patient was advised to call immediately if he has any concerning symptoms in the interval. The patient voices understanding of current disease status and treatment options and is in agreement with the current care plan. All questions were answered. The patient knows to call the clinic with any problems, questions or concerns. We can certainly see the patient much sooner if necessary.  Disclaimer: This note was dictated with voice recognition software. Similar sounding words can inadvertently be transcribed and may not be corrected upon review.

## 2018-02-06 DIAGNOSIS — M546 Pain in thoracic spine: Secondary | ICD-10-CM | POA: Diagnosis not present

## 2018-02-06 DIAGNOSIS — M9901 Segmental and somatic dysfunction of cervical region: Secondary | ICD-10-CM | POA: Diagnosis not present

## 2018-02-06 DIAGNOSIS — M62830 Muscle spasm of back: Secondary | ICD-10-CM | POA: Diagnosis not present

## 2018-02-06 DIAGNOSIS — M9902 Segmental and somatic dysfunction of thoracic region: Secondary | ICD-10-CM | POA: Diagnosis not present

## 2018-02-08 DIAGNOSIS — M9902 Segmental and somatic dysfunction of thoracic region: Secondary | ICD-10-CM | POA: Diagnosis not present

## 2018-02-08 DIAGNOSIS — M62830 Muscle spasm of back: Secondary | ICD-10-CM | POA: Diagnosis not present

## 2018-02-08 DIAGNOSIS — M546 Pain in thoracic spine: Secondary | ICD-10-CM | POA: Diagnosis not present

## 2018-02-08 DIAGNOSIS — M9901 Segmental and somatic dysfunction of cervical region: Secondary | ICD-10-CM | POA: Diagnosis not present

## 2018-02-19 DIAGNOSIS — M9901 Segmental and somatic dysfunction of cervical region: Secondary | ICD-10-CM | POA: Diagnosis not present

## 2018-02-19 DIAGNOSIS — M9902 Segmental and somatic dysfunction of thoracic region: Secondary | ICD-10-CM | POA: Diagnosis not present

## 2018-02-19 DIAGNOSIS — M62830 Muscle spasm of back: Secondary | ICD-10-CM | POA: Diagnosis not present

## 2018-02-19 DIAGNOSIS — M546 Pain in thoracic spine: Secondary | ICD-10-CM | POA: Diagnosis not present

## 2018-02-20 DIAGNOSIS — M9901 Segmental and somatic dysfunction of cervical region: Secondary | ICD-10-CM | POA: Diagnosis not present

## 2018-02-20 DIAGNOSIS — M546 Pain in thoracic spine: Secondary | ICD-10-CM | POA: Diagnosis not present

## 2018-02-20 DIAGNOSIS — M62830 Muscle spasm of back: Secondary | ICD-10-CM | POA: Diagnosis not present

## 2018-02-20 DIAGNOSIS — M9902 Segmental and somatic dysfunction of thoracic region: Secondary | ICD-10-CM | POA: Diagnosis not present

## 2018-02-21 DIAGNOSIS — M62830 Muscle spasm of back: Secondary | ICD-10-CM | POA: Diagnosis not present

## 2018-02-21 DIAGNOSIS — M546 Pain in thoracic spine: Secondary | ICD-10-CM | POA: Diagnosis not present

## 2018-02-21 DIAGNOSIS — M9901 Segmental and somatic dysfunction of cervical region: Secondary | ICD-10-CM | POA: Diagnosis not present

## 2018-02-21 DIAGNOSIS — M9902 Segmental and somatic dysfunction of thoracic region: Secondary | ICD-10-CM | POA: Diagnosis not present

## 2018-02-23 ENCOUNTER — Telehealth: Payer: Self-pay | Admitting: *Deleted

## 2018-02-23 ENCOUNTER — Other Ambulatory Visit: Payer: Self-pay | Admitting: *Deleted

## 2018-02-23 DIAGNOSIS — C3490 Malignant neoplasm of unspecified part of unspecified bronchus or lung: Secondary | ICD-10-CM

## 2018-02-23 DIAGNOSIS — G893 Neoplasm related pain (acute) (chronic): Secondary | ICD-10-CM

## 2018-02-23 MED ORDER — ALPRAZOLAM 0.25 MG PO TABS: 0.2500 mg | ORAL_TABLET | Freq: Two times a day (BID) | ORAL | 0 refills | Status: DC | PRN

## 2018-02-23 NOTE — Telephone Encounter (Signed)
Ok to refill 

## 2018-02-23 NOTE — Telephone Encounter (Signed)
Received call from pt asking for refill on his xanax.  Message to [facility] for [place].

## 2018-02-26 DIAGNOSIS — M9902 Segmental and somatic dysfunction of thoracic region: Secondary | ICD-10-CM | POA: Diagnosis not present

## 2018-02-26 DIAGNOSIS — M546 Pain in thoracic spine: Secondary | ICD-10-CM | POA: Diagnosis not present

## 2018-02-26 DIAGNOSIS — M9901 Segmental and somatic dysfunction of cervical region: Secondary | ICD-10-CM | POA: Diagnosis not present

## 2018-02-26 DIAGNOSIS — M62830 Muscle spasm of back: Secondary | ICD-10-CM | POA: Diagnosis not present

## 2018-02-26 MED ORDER — ALPRAZOLAM 0.25 MG PO TABS: 0.2500 mg | ORAL_TABLET | Freq: Two times a day (BID) | ORAL | 0 refills | Status: DC | PRN

## 2018-02-27 ENCOUNTER — Other Ambulatory Visit: Payer: Self-pay | Admitting: Medical Oncology

## 2018-02-27 DIAGNOSIS — C3490 Malignant neoplasm of unspecified part of unspecified bronchus or lung: Secondary | ICD-10-CM

## 2018-02-28 ENCOUNTER — Inpatient Hospital Stay (HOSPITAL_BASED_OUTPATIENT_CLINIC_OR_DEPARTMENT_OTHER): Payer: Medicare Other | Admitting: Internal Medicine

## 2018-02-28 ENCOUNTER — Encounter: Payer: Self-pay | Admitting: Internal Medicine

## 2018-02-28 ENCOUNTER — Inpatient Hospital Stay: Payer: Medicare Other

## 2018-02-28 ENCOUNTER — Telehealth: Payer: Self-pay | Admitting: Internal Medicine

## 2018-02-28 ENCOUNTER — Inpatient Hospital Stay: Payer: Medicare Other | Attending: Internal Medicine

## 2018-02-28 VITALS — BP 140/74 | HR 83 | Temp 97.6°F | Resp 18 | Ht 71.0 in | Wt 148.3 lb

## 2018-02-28 DIAGNOSIS — C7972 Secondary malignant neoplasm of left adrenal gland: Secondary | ICD-10-CM | POA: Diagnosis not present

## 2018-02-28 DIAGNOSIS — Z5112 Encounter for antineoplastic immunotherapy: Secondary | ICD-10-CM

## 2018-02-28 DIAGNOSIS — C3431 Malignant neoplasm of lower lobe, right bronchus or lung: Secondary | ICD-10-CM

## 2018-02-28 DIAGNOSIS — R5382 Chronic fatigue, unspecified: Secondary | ICD-10-CM

## 2018-02-28 DIAGNOSIS — Z79899 Other long term (current) drug therapy: Secondary | ICD-10-CM | POA: Diagnosis not present

## 2018-02-28 DIAGNOSIS — C3490 Malignant neoplasm of unspecified part of unspecified bronchus or lung: Secondary | ICD-10-CM

## 2018-02-28 DIAGNOSIS — G893 Neoplasm related pain (acute) (chronic): Secondary | ICD-10-CM

## 2018-02-28 DIAGNOSIS — R530 Neoplastic (malignant) related fatigue: Secondary | ICD-10-CM

## 2018-02-28 LAB — CBC WITH DIFFERENTIAL (CANCER CENTER ONLY)
BASOS ABS: 0.2 10*3/uL — AB (ref 0.0–0.1)
Basophils Relative: 3 %
EOS ABS: 0.2 10*3/uL (ref 0.0–0.5)
Eosinophils Relative: 3 %
HCT: 42.7 % (ref 38.4–49.9)
HEMOGLOBIN: 14.4 g/dL (ref 13.0–17.1)
Lymphocytes Relative: 13 %
Lymphs Abs: 0.8 10*3/uL — ABNORMAL LOW (ref 0.9–3.3)
MCH: 31.8 pg (ref 27.2–33.4)
MCHC: 33.7 g/dL (ref 32.0–36.0)
MCV: 94.6 fL (ref 79.3–98.0)
MONOS PCT: 8 %
Monocytes Absolute: 0.5 10*3/uL (ref 0.1–0.9)
NEUTROS ABS: 4.3 10*3/uL (ref 1.5–6.5)
NEUTROS PCT: 73 %
Platelet Count: 242 10*3/uL (ref 140–400)
RBC: 4.51 MIL/uL (ref 4.20–5.82)
RDW: 14.3 % (ref 11.0–14.6)
WBC Count: 5.9 10*3/uL (ref 4.0–10.3)

## 2018-02-28 LAB — CMP (CANCER CENTER ONLY)
ALT: 15 U/L (ref 0–55)
AST: 23 U/L (ref 5–34)
Albumin: 3.8 g/dL (ref 3.5–5.0)
Alkaline Phosphatase: 96 U/L (ref 40–150)
Anion gap: 8 (ref 3–11)
BILIRUBIN TOTAL: 0.7 mg/dL (ref 0.2–1.2)
BUN: 13 mg/dL (ref 7–26)
CALCIUM: 9.9 mg/dL (ref 8.4–10.4)
CHLORIDE: 98 mmol/L (ref 98–109)
CO2: 25 mmol/L (ref 22–29)
CREATININE: 0.85 mg/dL (ref 0.70–1.30)
Glucose, Bld: 82 mg/dL (ref 70–140)
Potassium: 3.8 mmol/L (ref 3.5–5.1)
Sodium: 131 mmol/L — ABNORMAL LOW (ref 136–145)
TOTAL PROTEIN: 6.4 g/dL (ref 6.4–8.3)

## 2018-02-28 LAB — TSH: TSH: 2.67 u[IU]/mL (ref 0.320–4.118)

## 2018-02-28 MED ORDER — SODIUM CHLORIDE 0.9 % IV SOLN
Freq: Once | INTRAVENOUS | Status: AC
  Administered 2018-02-28: 13:00:00 via INTRAVENOUS

## 2018-02-28 MED ORDER — HYDROCODONE-ACETAMINOPHEN 5-325 MG PO TABS: 1.0000 | ORAL_TABLET | Freq: Four times a day (QID) | ORAL | 0 refills | Status: DC | PRN

## 2018-02-28 MED ORDER — NIVOLUMAB CHEMO INJECTION 240 MG/24ML
480.0000 mg | Freq: Once | INTRAVENOUS | Status: AC
  Administered 2018-02-28: 480 mg via INTRAVENOUS
  Filled 2018-02-28: qty 48

## 2018-02-28 NOTE — Patient Instructions (Signed)
[facility] Discharge Instructions for Patients Receiving Chemotherapy  Today you received the following chemotherapy agents: Nivolumab  To help prevent nausea and vomiting after your treatment, we encourage you to take your nausea medication as directed.    If you develop nausea and vomiting that is not controlled by your nausea medication, call the clinic.   BELOW ARE SYMPTOMS THAT SHOULD BE REPORTED IMMEDIATELY:  *FEVER GREATER THAN 100.5 F  *CHILLS WITH OR WITHOUT FEVER  NAUSEA AND VOMITING THAT IS NOT CONTROLLED WITH YOUR NAUSEA MEDICATION  *UNUSUAL SHORTNESS OF BREATH  *UNUSUAL BRUISING OR BLEEDING  TENDERNESS IN MOUTH AND THROAT WITH OR WITHOUT PRESENCE OF ULCERS  *URINARY PROBLEMS  *BOWEL PROBLEMS  UNUSUAL RASH Items with * indicate a potential emergency and should be followed up as soon as possible.  Feel free to call the clinic should you have any questions or concerns. The clinic phone number is [phone].  Please show the CHEMO ALERT CARD at check-in to the Emergency Department and triage nurse.   

## 2018-02-28 NOTE — Progress Notes (Signed)
[place] [phone]   [phone]  OFFICE PROGRESS NOTE  [name], MD [place] [address]  DIAGNOSIS: Stage IV (T1a, N0, M1b) non-small cell lung cancer, adenocarcinoma of the right lower lobe diagnosed in October 2014  Molecular profile: Negative forRET, ALK, BRAF, MET, EGFR.  Positive for ERBB2 A775T, [id number], KRAS G13E, MAP2K1 D67N (see full report).   PRIOR THERAPY:  1) Systemic chemotherapy with carboplatin for AUC of 5 and Alimta 500 mg/M2 every 3 weeks, status post 6 cycles with stable disease. First dose on 10/23/2013. 2) Maintenance systemic chemotherapy with single agent Alimta 500 mg/M2 every 3 weeks. Status post 8 cycles.  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 Sondra Come completed on 12/16/2014. 4) stereotactic radiotherapy to the enlarging right upper lobe lung nodule under the care of Dr. Sondra Come completed on 11/22/2016. 5) Immunotherapy with Nivolumab 240 mg IV every 2 weeks, status post 60 cycles. First cycle was given on 01/28/2015. Last dose 07/05/2017.  CURRENT THERAPY: Immunotherapy with Nivolumab 480 mg IV every 4 weeks. First dose 07/19/2017. Status post 8 cycles.  INTERVAL HISTORY: Anthony Curry. 77 y.o. male returns to the clinic today for follow-up visit.  The patient is feeling fine today with no specific complaints.  He continues to tolerate his treatment with immunotherapy fairly well.  He came without his wife today because she had gallbladder surgery.  He denied having any chest pain, shortness of breath, cough or hemoptysis.  He denied having any fever or chills.  He has no nausea, vomiting, diarrhea or constipation.  He is here today for evaluation before restarting cycle #9.  MEDICAL HISTORY: Past Medical History:  Diagnosis Date  . 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[place])   . Adrenal mass ([place])   . Alcoholism in remission ([place])   .  Anemia of chronic disease   . 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 Anxiety   . BPH (benign prostatic hyperplasia)   . Cancer ([facility])    small cell/lung  . Complication of anesthesia 09/08/2015    JITTERY AFTER GALLBLADDER SURGERY  . GERD (gastroesophageal reflux disease)   . History of radiation therapy 11/14/16, 11/18/16, 11/22/16   Left upper lung: 54 Gy in 3 fractions  . Hypertension   . Hypertension 12/07/2016  . Neoplastic malignant related fatigue    IV tx. every 2 weeks last9-14-16 (Dr. Earlie Server), radiation last tx. 6 months  . 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 Persistent atrial fibrillation ([place])   . Pulmonary nodules   . Radiation 12/02/14-12/16/14   Left adrenal gland 30 Gy in 10 fractions    ALLERGIES:  has No Known Allergies.  MEDICATIONS:  Current Outpatient Medications  Medication Sig Dispense Refill  . ALPRAZolam (XANAX) 0.25 MG tablet Take 1 tablet (0.25 mg total) by mouth 2 (two) times daily as needed for anxiety. And also at night for sleep 60 tablet 0  . apixaban (ELIQUIS) 5 MG TABS tablet Take 5 mg by mouth 2 (two) times daily.    . Ascorbic Acid (VITAMIN C PO) Take 1,000 mg by mouth daily.     . B Complex-C (SUPER B COMPLEX PO) Take 1 each by mouth daily.     . calcium carbonate (OS-CAL) 600 MG TABS Take 600 mg by mouth daily with breakfast.    . demeclocycline (DECLOMYCIN) 150 MG tablet Take 150 mg by mouth 2 (two) times daily.    [name]  diltiazem (CARDIZEM CD) 180 MG 24 hr capsule Take 1 capsule (180 mg total) by mouth daily at 12 noon. 90 capsule 3  . furosemide (LASIX) 40 MG tablet Take 0.5 tablets (20 mg total) by mouth every other day.    [name] HYDROcodone-acetaminophen (NORCO/VICODIN) 5-325 MG tablet Take 1 tablet by mouth every 6 (six) hours as needed for moderate pain. 30 tablet 0  . KLOR-CON M20 20 MEQ tablet Take 20 mEq by mouth every morning.     . Lactobacillus (ACIDOPHILUS) CAPS capsule Take 1 capsule by mouth daily.    [name]  levothyroxine (SYNTHROID, LEVOTHROID) 50 MCG tablet Take 1 tablet (50 mcg total) by mouth daily before breakfast. 90 tablet 0  . MAGNESIUM PO Take 400 mg by mouth daily.     . Multiple Vitamin (MULTIVITAMIN WITH MINERALS) TABS Take 1 tablet by mouth daily.    . Nivolumab (OPDIVO IV) Inject as directed every 2 weeks    . Nutritional Supplements (ENSURE COMPLETE PO) Take 1 Can by mouth 2 (two) times daily.    . Omega-3 Fatty Acids (FISH OIL PO) Take 1 tablet by mouth daily.    . pantoprazole (PROTONIX) 40 MG tablet Take 40 mg by mouth every morning.  8  . tamsulosin (FLOMAX) 0.4 MG CAPS capsule Take 0.4 mg by mouth daily at 12 noon.     . thiamine (VITAMIN B-1) 100 MG tablet Take 1 tablet (100 mg total) by mouth daily. 30 tablet 6  . vitamin E 400 UNIT capsule Take 400 Units by mouth daily.     No current facility-administered medications for this visit.    Facility-Administered Medications Ordered in Other Visits  Medication Dose Route Frequency Provider Last Rate Last Dose  . 0.9 %  sodium chloride infusion   Intravenous Once [name], MD      . nivolumab (OPDIVO) 480 mg in sodium chloride 0.9 % 100 mL chemo infusion  480 mg Intravenous Once [name], MD        SURGICAL HISTORY:  Past Surgical History:  Procedure Laterality Date  . ANKLE ARTHRODESIS  1995   rt fx-hardware in  . CARDIOVERSION N/A 09/29/2014   Procedure: CARDIOVERSION;  Surgeon: Josue Hector, MD;  Location[facility];  Service: Cardiovascular;  Laterality: N/A;  . CHOLECYSTECTOMY  09/08/2015    laproscopic   . CHOLECYSTECTOMY N/A 09/08/2015   Procedure: LAPAROSCOPIC CHOLECYSTECTOMY WITH ATTEMPTED INTRAOPERATIVE CHOLANGIOGRAM ;  Surgeon: Fanny Skates, MD;  Location: [place];  Service: General;  Laterality: N/A;  . COLONOSCOPY    . ERCP N/A 09/02/2015   Procedure: ENDOSCOPIC RETROGRADE CHOLANGIOPANCREATOGRAPHY (ERCP);  Surgeon: Arta Silence, MD;  Location: [facility];  Service: Endoscopy;   Laterality: N/A;  . ERCP N/A 09/07/2015   Procedure: ENDOSCOPIC RETROGRADE CHOLANGIOPANCREATOGRAPHY (ERCP);  Surgeon: Clarene Essex, MD;  Location: [facility] ENDOS[facility];  Service: Endoscopy;  Laterality: N/A;  . EXCISION / CURETTAGE BONE CYST FINGER  2005   rt thumb  . HEMORRHOID SURGERY    . Percutaneous Cholecytostomy tube     IVR insertion 06-13-15(Dr. Vernard Gambles)- 08-28-15 remains in place to drainage bag  . TONSILLECTOMY    . TOTAL SHOULDER ARTHROPLASTY  02/03/2012   Procedure: TOTAL SHOULDER ARTHROPLASTY;  Surgeon: [name], MD;  Location: [place];  Service: Orthopedics;  Laterality: Left;    REVIEW OF SYSTEMS:  A comprehensive review of systems was negative except for: Constitutional: positive for fatigue   PHYSICAL EXAMINATION: General appearance: alert, cooperative and no distress Head: Normocephalic,  without obvious abnormality, atraumatic Neck: no adenopathy, no JVD, supple, symmetrical, trachea midline and thyroid not enlarged, symmetric, no tenderness/mass/nodules Lymph nodes: Cervical, supraclavicular, and axillary nodes normal. Resp: clear to auscultation bilaterally Back: symmetric, no curvature. ROM normal. No CVA tenderness. Cardio: regular rate and rhythm, S1, S2 normal, no murmur, click, rub or gallop GI: soft, non-tender; bowel sounds normal; no masses,  no organomegaly Extremities: extremities normal, atraumatic, no cyanosis or edema  ECOG PERFORMANCE STATUS: 1 - Symptomatic but completely ambulatory  Blood pressure 140/74, pulse 83, temperature 97.6 F (36.4 C), temperature source Oral, resp. rate 18, height 5' 11"  (1.803 m), weight 148 lb 4.8 oz (67.3 kg), SpO2 94 %.  LABORATORY DATA: Lab Results  Component Value Date   WBC 5.9 02/28/2018   HGB 14.0 12/06/2017   HCT 42.7 02/28/2018   MCV 94.6 02/28/2018   PLT 242 02/28/2018      Chemistry      Component Value Date/Time   NA 131 (L) 02/28/2018 1033   NA 132 (L) 12/06/2017 0930   K 3.8  02/28/2018 1033   K 4.0 12/06/2017 0930   CL 98 02/28/2018 1033   CO2 25 02/28/2018 1033   CO2 26 12/06/2017 0930   BUN 13 02/28/2018 1033   BUN 15.4 12/06/2017 0930   CREATININE 0.85 02/28/2018 1033   CREATININE 0.9 12/06/2017 0930      Component Value Date/Time   CALCIUM 9.9 02/28/2018 1033   CALCIUM 9.9 12/06/2017 0930   ALKPHOS 96 02/28/2018 1033   ALKPHOS 107 12/06/2017 0930   AST 23 02/28/2018 1033   AST 20 12/06/2017 0930   ALT 15 02/28/2018 1033   ALT 14 12/06/2017 0930   BILITOT 0.7 02/28/2018 1033   BILITOT 0.76 12/06/2017 0930       RADIOGRAPHIC STUDIES: No results found.  ASSESSMENT AND PLAN:  This is a very pleasant 77 years old white male with metastatic non-small cell lung cancer, adenocarcinoma and currently on treatment with second line immunotherapy with Nivolumab status post 60 cycles and has been tolerating his treatment well. He was recently switched to Nivolumab 480 MG IV every 4 weeks status post 8 cycles. The patient has no complaints today and he continues to tolerate his treatment fairly well. I recommended for him to proceed with cycle #9 today as a scheduled. He will come back for follow-up visit in 4 weeks for evaluation before the next dose of his treatment. I gave the patient refill for Vicodin today. He was advised to call immediately if he has any concerning symptoms in the interval. The patient voices understanding of current disease status and treatment options and is in agreement with the current care plan. All questions were answered. The patient knows to call the clinic with any problems, questions or concerns. We can certainly see the patient much sooner if necessary.  Disclaimer: This note was dictated with voice recognition software. Similar sounding words can inadvertently be transcribed and may not be corrected upon review.

## 2018-02-28 NOTE — Telephone Encounter (Signed)
Scheduled appt per 3/27 los - patient to get an updated schedule next visit.

## 2018-03-01 DIAGNOSIS — M546 Pain in thoracic spine: Secondary | ICD-10-CM | POA: Diagnosis not present

## 2018-03-01 DIAGNOSIS — M62830 Muscle spasm of back: Secondary | ICD-10-CM | POA: Diagnosis not present

## 2018-03-01 DIAGNOSIS — M9901 Segmental and somatic dysfunction of cervical region: Secondary | ICD-10-CM | POA: Diagnosis not present

## 2018-03-01 DIAGNOSIS — M9902 Segmental and somatic dysfunction of thoracic region: Secondary | ICD-10-CM | POA: Diagnosis not present

## 2018-03-02 DIAGNOSIS — M62830 Muscle spasm of back: Secondary | ICD-10-CM | POA: Diagnosis not present

## 2018-03-02 DIAGNOSIS — M9902 Segmental and somatic dysfunction of thoracic region: Secondary | ICD-10-CM | POA: Diagnosis not present

## 2018-03-02 DIAGNOSIS — M9901 Segmental and somatic dysfunction of cervical region: Secondary | ICD-10-CM | POA: Diagnosis not present

## 2018-03-02 DIAGNOSIS — H353221 Exudative age-related macular degeneration, left eye, with active choroidal neovascularization: Secondary | ICD-10-CM | POA: Diagnosis not present

## 2018-03-02 DIAGNOSIS — M546 Pain in thoracic spine: Secondary | ICD-10-CM | POA: Diagnosis not present

## 2018-03-05 DIAGNOSIS — M9902 Segmental and somatic dysfunction of thoracic region: Secondary | ICD-10-CM | POA: Diagnosis not present

## 2018-03-05 DIAGNOSIS — M62830 Muscle spasm of back: Secondary | ICD-10-CM | POA: Diagnosis not present

## 2018-03-05 DIAGNOSIS — M9901 Segmental and somatic dysfunction of cervical region: Secondary | ICD-10-CM | POA: Diagnosis not present

## 2018-03-05 DIAGNOSIS — M546 Pain in thoracic spine: Secondary | ICD-10-CM | POA: Diagnosis not present

## 2018-03-23 ENCOUNTER — Telehealth: Payer: Self-pay | Admitting: *Deleted

## 2018-03-23 ENCOUNTER — Other Ambulatory Visit: Payer: Self-pay | Admitting: Internal Medicine

## 2018-03-23 DIAGNOSIS — G893 Neoplasm related pain (acute) (chronic): Secondary | ICD-10-CM

## 2018-03-23 DIAGNOSIS — C3490 Malignant neoplasm of unspecified part of unspecified bronchus or lung: Secondary | ICD-10-CM

## 2018-03-23 NOTE — Telephone Encounter (Signed)
This RN spoke with per call to TRIAGE at 215 pm from his wife stating concern due to " he has a knot on his arm and his veins are like popping out "  Mrs Bazen gave phone to her husband post this RN inquiring further with her stating " I do not know - I will get him "  Mr Danese states he noticed " a lump about 15 minutes ago in the wrist right below my right palm "  Lump is soft " and I can move it around "  He states his fingers are slightly number but able to move them without pain.  He does not notice any bite marks " so no it is not an insect bite "  He states the size is that of of a marble.  Per review of medications - Mr Tanguma states he is on Eliquis.  While on the phone the lump was not worsening and there was no redness or swelling of the hand or arm.  Per discussion - this RN advised pt to apply cold compress and elevate arm so hand/wrist is above the heart ( like on the back of the sofa ).  This RN would follow up in 1 hour.  Per call at 315pm from this RN - Mr Shepherd states lump is about the same - not worse but now " is it more firm "  His fingers are a little numb " but I think that is the ice "  He denies any arm pain, swelling or redness.  Per discussion this RN recommended pt to watch area and if it does not improve or he notices any worsening he should proceed to the ER or an Urgent care.  Mr Gumz verbalized understanding .  No further needs at this time.  This note will be sent to MD and nurse for communication of pt's call.

## 2018-03-24 ENCOUNTER — Other Ambulatory Visit: Payer: Self-pay | Admitting: Internal Medicine

## 2018-03-27 ENCOUNTER — Other Ambulatory Visit: Payer: Self-pay | Admitting: Medical Oncology

## 2018-03-27 DIAGNOSIS — C3431 Malignant neoplasm of lower lobe, right bronchus or lung: Secondary | ICD-10-CM

## 2018-03-28 ENCOUNTER — Inpatient Hospital Stay: Payer: Medicare Other | Attending: Internal Medicine

## 2018-03-28 ENCOUNTER — Inpatient Hospital Stay (HOSPITAL_BASED_OUTPATIENT_CLINIC_OR_DEPARTMENT_OTHER): Payer: Medicare Other | Admitting: Internal Medicine

## 2018-03-28 ENCOUNTER — Inpatient Hospital Stay: Payer: Medicare Other

## 2018-03-28 ENCOUNTER — Encounter: Payer: Self-pay | Admitting: *Deleted

## 2018-03-28 ENCOUNTER — Telehealth: Payer: Self-pay | Admitting: Internal Medicine

## 2018-03-28 ENCOUNTER — Encounter: Payer: Self-pay | Admitting: Internal Medicine

## 2018-03-28 VITALS — BP 145/65 | HR 63 | Temp 97.8°F | Resp 17 | Ht 71.0 in | Wt 145.9 lb

## 2018-03-28 DIAGNOSIS — Z5112 Encounter for antineoplastic immunotherapy: Secondary | ICD-10-CM

## 2018-03-28 DIAGNOSIS — Z79899 Other long term (current) drug therapy: Secondary | ICD-10-CM | POA: Diagnosis not present

## 2018-03-28 DIAGNOSIS — R52 Pain, unspecified: Secondary | ICD-10-CM | POA: Diagnosis not present

## 2018-03-28 DIAGNOSIS — R42 Dizziness and giddiness: Secondary | ICD-10-CM | POA: Diagnosis not present

## 2018-03-28 DIAGNOSIS — C3490 Malignant neoplasm of unspecified part of unspecified bronchus or lung: Secondary | ICD-10-CM

## 2018-03-28 DIAGNOSIS — C3431 Malignant neoplasm of lower lobe, right bronchus or lung: Secondary | ICD-10-CM

## 2018-03-28 DIAGNOSIS — G893 Neoplasm related pain (acute) (chronic): Secondary | ICD-10-CM

## 2018-03-28 DIAGNOSIS — G620 Drug-induced polyneuropathy: Secondary | ICD-10-CM | POA: Diagnosis not present

## 2018-03-28 DIAGNOSIS — R5382 Chronic fatigue, unspecified: Secondary | ICD-10-CM

## 2018-03-28 LAB — CBC WITH DIFFERENTIAL (CANCER CENTER ONLY)
BASOS ABS: 0 10*3/uL (ref 0.0–0.1)
Basophils Relative: 1 %
EOS PCT: 7 %
Eosinophils Absolute: 0.3 10*3/uL (ref 0.0–0.5)
HCT: 42 % (ref 38.4–49.9)
HEMOGLOBIN: 14.8 g/dL (ref 13.0–17.1)
LYMPHS PCT: 25 %
Lymphs Abs: 1.1 10*3/uL (ref 0.9–3.3)
MCH: 32.5 pg (ref 27.2–33.4)
MCHC: 35.2 g/dL (ref 32.0–36.0)
MCV: 92.1 fL (ref 79.3–98.0)
Monocytes Absolute: 0.6 10*3/uL (ref 0.1–0.9)
Monocytes Relative: 13 %
NEUTROS PCT: 54 %
Neutro Abs: 2.5 10*3/uL (ref 1.5–6.5)
Platelet Count: 233 10*3/uL (ref 140–400)
RBC: 4.56 MIL/uL (ref 4.20–5.82)
RDW: 15 % — ABNORMAL HIGH (ref 11.0–14.6)
WBC: 4.6 10*3/uL (ref 4.0–10.3)

## 2018-03-28 LAB — CMP (CANCER CENTER ONLY)
ALK PHOS: 98 U/L (ref 40–150)
ALT: 22 U/L (ref 0–55)
AST: 31 U/L (ref 5–34)
Albumin: 4 g/dL (ref 3.5–5.0)
Anion gap: 12 — ABNORMAL HIGH (ref 3–11)
BUN: 20 mg/dL (ref 7–26)
CO2: 22 mmol/L (ref 22–29)
CREATININE: 0.87 mg/dL (ref 0.70–1.30)
Calcium: 10.3 mg/dL (ref 8.4–10.4)
Chloride: 100 mmol/L (ref 98–109)
GFR, Est AFR Am: >60 mL/min (ref >60)
GFR, Estimated: >60 mL/min (ref >60)
GLUCOSE: 100 mg/dL (ref 70–140)
Potassium: 4.6 mmol/L (ref 3.5–5.1)
SODIUM: 134 mmol/L — AB (ref 136–145)
Total Bilirubin: 0.5 mg/dL (ref 0.2–1.2)
Total Protein: 6.5 g/dL (ref 6.4–8.3)

## 2018-03-28 LAB — TSH: TSH: 3.476 u[IU]/mL (ref 0.320–4.118)

## 2018-03-28 MED ORDER — HYDROCODONE-ACETAMINOPHEN 5-325 MG PO TABS: 1.0000 | ORAL_TABLET | Freq: Four times a day (QID) | ORAL | 0 refills | Status: DC | PRN

## 2018-03-28 MED ORDER — SODIUM CHLORIDE 0.9 % IV SOLN
480.0000 mg | Freq: Once | INTRAVENOUS | Status: AC
  Administered 2018-03-28: 480 mg via INTRAVENOUS
  Filled 2018-03-28: qty 48

## 2018-03-28 MED ORDER — SODIUM CHLORIDE 0.9 % IV SOLN
Freq: Once | INTRAVENOUS | Status: AC
  Administered 2018-03-28: 12:00:00 via INTRAVENOUS

## 2018-03-28 NOTE — Telephone Encounter (Signed)
Appts already scheduled pet 4/24 los.

## 2018-03-28 NOTE — Patient Instructions (Signed)
[facility] Discharge Instructions for Patients Receiving Chemotherapy  Today you received the following chemotherapy agents:  Nivolumab.  To help prevent nausea and vomiting after your treatment, we encourage you to take your nausea medication as directed.   If you develop nausea and vomiting that is not controlled by your nausea medication, call the clinic.   BELOW ARE SYMPTOMS THAT SHOULD BE REPORTED IMMEDIATELY:  *FEVER GREATER THAN 100.5 F  *CHILLS WITH OR WITHOUT FEVER  NAUSEA AND VOMITING THAT IS NOT CONTROLLED WITH YOUR NAUSEA MEDICATION  *UNUSUAL SHORTNESS OF BREATH  *UNUSUAL BRUISING OR BLEEDING  TENDERNESS IN MOUTH AND THROAT WITH OR WITHOUT PRESENCE OF ULCERS  *URINARY PROBLEMS  *BOWEL PROBLEMS  UNUSUAL RASH Items with * indicate a potential emergency and should be followed up as soon as possible.  Feel free to call the clinic you have any questions or concerns. The clinic phone number is [phone].  Please show the CHEMO ALERT CARD at check-in to the Emergency Department and triage nurse.   

## 2018-03-28 NOTE — Progress Notes (Signed)
Oncology Nurse Navigator Documentation  Oncology Nurse Navigator Flowsheets 03/28/2018  Navigator Location [facility]  Navigator Encounter Type Clinic/MDC/spoke with patient and wife today.  Education is the identifiable barrier.  I explained treatment plan.  He verbalized understanding.   Patient Visit Type MedOnc  Treatment Phase Treatment  Barriers/Navigation Needs Education  Education Other  Interventions Education  Acuity Level 1  Time Spent with Patient 30

## 2018-03-28 NOTE — Progress Notes (Signed)
[place] [phone]   Fax:(336) [phone]  OFFICE PROGRESS NOTE  [name], MD [place] [address]  DIAGNOSIS: Stage IV (T1a, N0, M1b) non-small cell lung cancer, adenocarcinoma of the right lower lobe diagnosed in October 2014  Molecular profile: Negative forRET, ALK, BRAF, MET, EGFR.  Positive for ERBB2 A775T, [id number], KRAS G13E, MAP2K1 D67N (see full report).   PRIOR THERAPY:  1) Systemic chemotherapy with carboplatin for AUC of 5 and Alimta 500 mg/M2 every 3 weeks, status post 6 cycles with stable disease. First dose on 10/23/2013. 2) Maintenance systemic chemotherapy with single agent Alimta 500 mg/M2 every 3 weeks. Status post 8 cycles.  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 Sondra Come completed on 12/16/2014. 4) stereotactic radiotherapy to the enlarging right upper lobe lung nodule under the care of Dr. Sondra Come completed on 11/22/2016. 5) Immunotherapy with Nivolumab 240 mg IV every 2 weeks, status post 60 cycles. First cycle was given on 01/28/2015. Last dose 07/05/2017.  CURRENT THERAPY: Immunotherapy with Nivolumab 480 mg IV every 4 weeks. First dose 07/19/2017. Status post 9 cycles.  INTERVAL HISTORY: Anthony Curry. 77 y.o. male returns to the clinic today for follow-up visit accompanied by his wife.  The patient is feeling fine today with no specific complaints except for the baseline dizzy spells and mild peripheral neuropathy.  He denied having any chest pain, shortness breath, cough or hemoptysis.  He denied having any weight loss or night sweats.  He has no nausea, vomiting, diarrhea or constipation.  He [place] most [place] in [facility] and comes only to [place] for his treatment.  He denied having any fever or chills.  He is here today for evaluation before starting cycle #10.   MEDICAL HISTORY: Past Medical  History:  Diagnosis Date  . 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 Adrenal mass ([place])   . Alcoholism in remission ([place])   . Anemia of chronic disease   . 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 Anxiety   . BPH (benign prostatic hyperplasia)   . Cancer ([place])    small cell/lung  . Complication of anesthesia 09/08/2015    JITTERY AFTER GALLBLADDER SURGERY  . GERD (gastroesophageal reflux disease)   . History of radiation therapy 11/14/16, 11/18/16, 11/22/16   Left upper lung: 54 Gy in 3 fractions  . Hypertension   . Hypertension 12/07/2016  . Neoplastic malignant related fatigue    IV tx. every 2 weeks last9-14-16 (Dr. Earlie Server), radiation last tx. 6 months  . 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 Persistent atrial fibrillation ([place])   . Pulmonary nodules   . Radiation 12/02/14-12/16/14   Left adrenal gland 30 Gy in 10 fractions    ALLERGIES:  has No Known Allergies.  MEDICATIONS:  Current Outpatient Medications  Medication Sig Dispense Refill  . ALPRAZolam (XANAX) 0.25 MG tablet TAKE 1 TABLET BY MOUTH TWICE A DAY AS NEEDED FOR ANXIETY AND AS NEEDED FOR SLEEP 60 tablet 0  . apixaban (ELIQUIS) 5 MG TABS tablet Take 5 mg by mouth 2 (two) times daily.    . Ascorbic Acid (VITAMIN C PO) Take 1,000 mg by mouth daily.     . B Complex-C (SUPER B COMPLEX PO) Take 1 each by mouth daily.     . calcium carbonate (OS-CAL) 600 MG TABS Take 600 mg by mouth daily with  breakfast.    . demeclocycline (DECLOMYCIN) 150 MG tablet Take 150 mg by mouth 2 (two) times daily.    [name] diltiazem (CARDIZEM CD) 180 MG 24 hr capsule Take 1 capsule (180 mg total) by mouth daily at 12 noon. 90 capsule 3  . furosemide (LASIX) 40 MG tablet Take 0.5 tablets (20 mg total) by mouth every other day.    [name] HYDROcodone-acetaminophen (NORCO/VICODIN) 5-325 MG tablet Take 1 tablet by mouth every 6 (six) hours as needed for moderate pain. 30 tablet 0  . KLOR-CON M20 20 MEQ tablet Take 20 mEq by mouth every morning.       . Lactobacillus (ACIDOPHILUS) CAPS capsule Take 1 capsule by mouth daily.    [name] levothyroxine (SYNTHROID, LEVOTHROID) 50 MCG tablet TAKE 1 TABLET (50 MCG TOTAL) BY MOUTH DAILY BEFORE BREAKFAST. 90 tablet 0  . MAGNESIUM PO Take 400 mg by mouth daily.     . Multiple Vitamin (MULTIVITAMIN WITH MINERALS) TABS Take 1 tablet by mouth daily.    . Nivolumab (OPDIVO IV) Inject as directed every 2 weeks    . Nutritional Supplements (ENSURE COMPLETE PO) Take 1 Can by mouth 2 (two) times daily.    . Omega-3 Fatty Acids (FISH OIL PO) Take 1 tablet by mouth daily.    . pantoprazole (PROTONIX) 40 MG tablet Take 40 mg by mouth every morning.  8  . tamsulosin (FLOMAX) 0.4 MG CAPS capsule Take 0.4 mg by mouth daily at 12 noon.     . thiamine (VITAMIN B-1) 100 MG tablet Take 1 tablet (100 mg total) by mouth daily. 30 tablet 6  . vitamin E 400 UNIT capsule Take 400 Units by mouth daily.     No current facility-administered medications for this visit.     SURGICAL HISTORY:  Past Surgical History:  Procedure Laterality Date  . ANKLE ARTHRODESIS  1995   rt fx-hardware in  . CARDIOVERSION N/A 09/29/2014   Procedure: CARDIOVERSION;  Surgeon: Josue Hector, MD;  Location[facility] ENDOSCOPY;  Service: Cardiovascular;  Laterality: N/A;  . CHOLECYSTECTOMY  09/08/2015    laproscopic   . CHOLECYSTECTOMY N/A 09/08/2015   Procedure: LAPAROSCOPIC CHOLECYSTECTOMY WITH ATTEMPTED INTRAOPERATIVE CHOLANGIOGRAM ;  Surgeon: Fanny Skates, MD;  Location: [place];  Service: General;  Laterality: N/A;  . COLONOSCOPY    . ERCP N/A 09/02/2015   Procedure: ENDOSCOPIC RETROGRADE CHOLANGIOPANCREATOGRAPHY (ERCP);  Surgeon: Arta Silence, MD;  Location: [facility];  Service: Endoscopy;  Laterality: N/A;  . ERCP N/A 09/07/2015   Procedure: ENDOSCOPIC RETROGRADE CHOLANGIOPANCREATOGRAPHY (ERCP);  Surgeon: Clarene Essex, MD;  Location: [facility];  Service: Endoscopy;  Laterality: N/A;  . EXCISION / CURETTAGE BONE CYST FINGER  2005   rt thumb   . HEMORRHOID SURGERY    . Percutaneous Cholecytostomy tube     IVR insertion 06-13-15(Dr. Vernard Gambles)- 08-28-15 remains in place to drainage bag  . TONSILLECTOMY    . TOTAL SHOULDER ARTHROPLASTY  02/03/2012   Procedure: TOTAL SHOULDER ARTHROPLASTY;  Surgeon: [name], MD;  Location: [place];  Service: Orthopedics;  Laterality: Left;    REVIEW OF SYSTEMS:  A comprehensive review of systems was negative except for: Constitutional: positive for fatigue Neurological: positive for dizziness and paresthesia   PHYSICAL EXAMINATION: General appearance: alert, cooperative and no distress Head: Normocephalic, without obvious abnormality, atraumatic Neck: no adenopathy, no JVD, supple, symmetrical, trachea midline and thyroid not enlarged, symmetric, no tenderness/mass/nodules Lymph nodes: Cervical, supraclavicular, and axillary nodes normal. Resp: clear to auscultation bilaterally Back:  symmetric, no curvature. ROM normal. No CVA tenderness. Cardio: regular rate and rhythm, S1, S2 normal, no murmur, click, rub or gallop GI: soft, non-tender; bowel sounds normal; no masses,  no organomegaly Extremities: extremities normal, atraumatic, no cyanosis or edema  ECOG PERFORMANCE STATUS: 1 - Symptomatic but completely ambulatory  Blood pressure (!) 145/65, pulse 63, temperature 97.8 F (36.6 C), temperature source Oral, resp. rate 17, height 5' 11" (1.803 m), weight 145 lb 14.4 oz (66.2 kg), SpO2 96 %.  LABORATORY DATA: Lab Results  Component Value Date   WBC 4.6 03/28/2018   HGB 14.8 03/28/2018   HCT 42.0 03/28/2018   MCV 92.1 03/28/2018   PLT 233 03/28/2018      Chemistry      Component Value Date/Time   NA 131 (L) 02/28/2018 1033   NA 132 (L) 12/06/2017 0930   K 3.8 02/28/2018 1033   K 4.0 12/06/2017 0930   CL 98 02/28/2018 1033   CO2 25 02/28/2018 1033   CO2 26 12/06/2017 0930   BUN 13 02/28/2018 1033   BUN 15.4 12/06/2017 0930   CREATININE 0.85 02/28/2018 1033    CREATININE 0.9 12/06/2017 0930      Component Value Date/Time   CALCIUM 9.9 02/28/2018 1033   CALCIUM 9.9 12/06/2017 0930   ALKPHOS 96 02/28/2018 1033   ALKPHOS 107 12/06/2017 0930   AST 23 02/28/2018 1033   AST 20 12/06/2017 0930   ALT 15 02/28/2018 1033   ALT 14 12/06/2017 0930   BILITOT 0.7 02/28/2018 1033   BILITOT 0.76 12/06/2017 0930       RADIOGRAPHIC STUDIES: No results found.  ASSESSMENT AND PLAN:  This is a very pleasant 77 years old white male with metastatic non-small cell lung cancer, adenocarcinoma and currently on treatment with second line immunotherapy with Nivolumab status post 60 cycles and has been tolerating his treatment well. He was recently switched to Nivolumab 480 MG IV every 4 weeks status post 9 cycles. He continues to tolerate this treatment well with no concerning complaints.  I recommended for him to proceed with cycle #10 today as a scheduled. I will see him back for follow-up visit in 4 weeks for evaluation after repeating CT scan of the chest, abdomen and pelvis for restaging of his disease. For pain management I give him a refill of Vicodin. The patient was advised to call immediately if he has any concerning symptoms in the interval. The patient voices understanding of current disease status and treatment options and is in agreement with the current care plan. All questions were answered. The patient knows to call the clinic with any problems, questions or concerns. We can certainly see the patient much sooner if necessary.  Disclaimer: This note was dictated with voice recognition software. Similar sounding words can inadvertently be transcribed and may not be corrected upon review.

## 2018-03-28 NOTE — Patient Instructions (Signed)
[facility] Discharge Instructions for Patients Receiving Chemotherapy  Today you received the following chemotherapy agents: Nivolumab (Opdivo)  To help prevent nausea and vomiting after your treatment, we encourage you to take your nausea medication as prescribed.    If you develop nausea and vomiting that is not controlled by your nausea medication, call the clinic.   BELOW ARE SYMPTOMS THAT SHOULD BE REPORTED IMMEDIATELY:  *FEVER GREATER THAN 100.5 F  *CHILLS WITH OR WITHOUT FEVER  NAUSEA AND VOMITING THAT IS NOT CONTROLLED WITH YOUR NAUSEA MEDICATION  *UNUSUAL SHORTNESS OF BREATH  *UNUSUAL BRUISING OR BLEEDING  TENDERNESS IN MOUTH AND THROAT WITH OR WITHOUT PRESENCE OF ULCERS  *URINARY PROBLEMS  *BOWEL PROBLEMS  UNUSUAL RASH Items with * indicate a potential emergency and should be followed up as soon as possible.  Feel free to call the clinic should you have any questions or concerns. The clinic phone number is [phone].  Please show the CHEMO ALERT CARD at check-in to the Emergency Department and triage nurse.   

## 2018-03-29 DIAGNOSIS — M9901 Segmental and somatic dysfunction of cervical region: Secondary | ICD-10-CM | POA: Diagnosis not present

## 2018-03-29 DIAGNOSIS — M546 Pain in thoracic spine: Secondary | ICD-10-CM | POA: Diagnosis not present

## 2018-03-29 DIAGNOSIS — M62830 Muscle spasm of back: Secondary | ICD-10-CM | POA: Diagnosis not present

## 2018-03-29 DIAGNOSIS — M9902 Segmental and somatic dysfunction of thoracic region: Secondary | ICD-10-CM | POA: Diagnosis not present

## 2018-03-30 DIAGNOSIS — H353221 Exudative age-related macular degeneration, left eye, with active choroidal neovascularization: Secondary | ICD-10-CM | POA: Diagnosis not present

## 2018-04-02 DIAGNOSIS — M9902 Segmental and somatic dysfunction of thoracic region: Secondary | ICD-10-CM | POA: Diagnosis not present

## 2018-04-02 DIAGNOSIS — M9901 Segmental and somatic dysfunction of cervical region: Secondary | ICD-10-CM | POA: Diagnosis not present

## 2018-04-02 DIAGNOSIS — M62830 Muscle spasm of back: Secondary | ICD-10-CM | POA: Diagnosis not present

## 2018-04-02 DIAGNOSIS — M546 Pain in thoracic spine: Secondary | ICD-10-CM | POA: Diagnosis not present

## 2018-04-04 DIAGNOSIS — M62830 Muscle spasm of back: Secondary | ICD-10-CM | POA: Diagnosis not present

## 2018-04-04 DIAGNOSIS — M9901 Segmental and somatic dysfunction of cervical region: Secondary | ICD-10-CM | POA: Diagnosis not present

## 2018-04-04 DIAGNOSIS — M546 Pain in thoracic spine: Secondary | ICD-10-CM | POA: Diagnosis not present

## 2018-04-04 DIAGNOSIS — M9902 Segmental and somatic dysfunction of thoracic region: Secondary | ICD-10-CM | POA: Diagnosis not present

## 2018-04-05 DIAGNOSIS — M9901 Segmental and somatic dysfunction of cervical region: Secondary | ICD-10-CM | POA: Diagnosis not present

## 2018-04-05 DIAGNOSIS — M62830 Muscle spasm of back: Secondary | ICD-10-CM | POA: Diagnosis not present

## 2018-04-05 DIAGNOSIS — M9902 Segmental and somatic dysfunction of thoracic region: Secondary | ICD-10-CM | POA: Diagnosis not present

## 2018-04-05 DIAGNOSIS — M546 Pain in thoracic spine: Secondary | ICD-10-CM | POA: Diagnosis not present

## 2018-04-11 ENCOUNTER — Telehealth: Payer: Self-pay | Admitting: Medical Oncology

## 2018-04-11 NOTE — Telephone Encounter (Signed)
Asking for CT scan appt. I sent message to CT to call pt to schedule. He needs it a few days before may 22.

## 2018-04-23 ENCOUNTER — Ambulatory Visit (HOSPITAL_COMMUNITY)
Admission: RE | Admit: 2018-04-23 | Discharge: 2018-04-23 | Disposition: A | Discharge status: Home / Self Care | Payer: Medicare Other | Source: Ambulatory Visit | Attending: Internal Medicine | Admitting: Internal Medicine

## 2018-04-23 DIAGNOSIS — C3490 Malignant neoplasm of unspecified part of unspecified bronchus or lung: Secondary | ICD-10-CM | POA: Diagnosis present

## 2018-04-23 DIAGNOSIS — E279 Disorder of adrenal gland, unspecified: Secondary | ICD-10-CM | POA: Diagnosis not present

## 2018-04-23 DIAGNOSIS — C3491 Malignant neoplasm of unspecified part of right bronchus or lung: Secondary | ICD-10-CM | POA: Diagnosis not present

## 2018-04-23 DIAGNOSIS — E278 Other specified disorders of adrenal gland: Secondary | ICD-10-CM | POA: Insufficient documentation

## 2018-04-23 MED ORDER — IOPAMIDOL (ISOVUE-300) INJECTION 61%
INTRAVENOUS | Status: AC
  Filled 2018-04-23: qty 100

## 2018-04-23 MED ORDER — IOPAMIDOL (ISOVUE-300) INJECTION 61%
100.0000 mL | Freq: Once | INTRAVENOUS | Status: AC | PRN
  Administered 2018-04-23: 100 mL via INTRAVENOUS

## 2018-04-25 ENCOUNTER — Other Ambulatory Visit: Payer: Self-pay | Admitting: *Deleted

## 2018-04-25 ENCOUNTER — Inpatient Hospital Stay: Payer: Medicare Other | Attending: Internal Medicine

## 2018-04-25 ENCOUNTER — Inpatient Hospital Stay (HOSPITAL_BASED_OUTPATIENT_CLINIC_OR_DEPARTMENT_OTHER): Payer: Medicare Other | Admitting: Internal Medicine

## 2018-04-25 ENCOUNTER — Inpatient Hospital Stay: Payer: Medicare Other

## 2018-04-25 ENCOUNTER — Telehealth: Payer: Self-pay | Admitting: Internal Medicine

## 2018-04-25 ENCOUNTER — Encounter: Payer: Self-pay | Admitting: Internal Medicine

## 2018-04-25 VITALS — BP 134/69 | HR 59 | Temp 97.7°F | Resp 18 | Ht 71.0 in | Wt 144.8 lb

## 2018-04-25 DIAGNOSIS — Z5112 Encounter for antineoplastic immunotherapy: Secondary | ICD-10-CM | POA: Diagnosis not present

## 2018-04-25 DIAGNOSIS — R5382 Chronic fatigue, unspecified: Secondary | ICD-10-CM

## 2018-04-25 DIAGNOSIS — C3431 Malignant neoplasm of lower lobe, right bronchus or lung: Secondary | ICD-10-CM

## 2018-04-25 DIAGNOSIS — R530 Neoplastic (malignant) related fatigue: Secondary | ICD-10-CM

## 2018-04-25 DIAGNOSIS — Z79899 Other long term (current) drug therapy: Secondary | ICD-10-CM | POA: Diagnosis not present

## 2018-04-25 LAB — TSH: TSH: 2.563 u[IU]/mL (ref 0.320–4.118)

## 2018-04-25 LAB — CMP (CANCER CENTER ONLY)
ALT: 15 U/L (ref 0–55)
ANION GAP: 8 (ref 3–11)
AST: 24 U/L (ref 5–34)
Albumin: 3.8 g/dL (ref 3.5–5.0)
Alkaline Phosphatase: 91 U/L (ref 40–150)
BUN: 17 mg/dL (ref 7–26)
CHLORIDE: 101 mmol/L (ref 98–109)
CO2: 24 mmol/L (ref 22–29)
Calcium: 9.6 mg/dL (ref 8.4–10.4)
Creatinine: 0.81 mg/dL (ref 0.70–1.30)
GFR, Est AFR Am: >60 mL/min (ref >60)
GFR, Estimated: >60 mL/min (ref >60)
GLUCOSE: 92 mg/dL (ref 70–140)
POTASSIUM: 4.2 mmol/L (ref 3.5–5.1)
SODIUM: 133 mmol/L — AB (ref 136–145)
Total Bilirubin: 0.7 mg/dL (ref 0.2–1.2)
Total Protein: 6.3 g/dL — ABNORMAL LOW (ref 6.4–8.3)

## 2018-04-25 LAB — CBC WITH DIFFERENTIAL (CANCER CENTER ONLY)
BASOS PCT: 1 %
Basophils Absolute: 0 10*3/uL (ref 0.0–0.1)
EOS ABS: 0.4 10*3/uL (ref 0.0–0.5)
Eosinophils Relative: 11 %
HCT: 42.6 % (ref 38.4–49.9)
HEMOGLOBIN: 14.3 g/dL (ref 13.0–17.1)
LYMPHS ABS: 1.1 10*3/uL (ref 0.9–3.3)
LYMPHS PCT: 28 %
MCH: 32.1 pg (ref 27.2–33.4)
MCHC: 33.6 g/dL (ref 32.0–36.0)
MCV: 95.6 fL (ref 79.3–98.0)
Monocytes Absolute: 0.5 10*3/uL (ref 0.1–0.9)
Monocytes Relative: 13 %
Neutro Abs: 1.9 10*3/uL (ref 1.5–6.5)
Neutrophils Relative %: 47 %
Platelet Count: 256 10*3/uL (ref 140–400)
RBC: 4.46 MIL/uL (ref 4.20–5.82)
RDW: 15.6 % — ABNORMAL HIGH (ref 11.0–14.6)
WBC Count: 4.1 10*3/uL (ref 4.0–10.3)

## 2018-04-25 MED ORDER — SODIUM CHLORIDE 0.9 % IV SOLN
480.0000 mg | Freq: Once | INTRAVENOUS | Status: AC
  Administered 2018-04-25: 480 mg via INTRAVENOUS
  Filled 2018-04-25: qty 48

## 2018-04-25 MED ORDER — SODIUM CHLORIDE 0.9 % IV SOLN
Freq: Once | INTRAVENOUS | Status: AC
  Administered 2018-04-25: 12:00:00 via INTRAVENOUS

## 2018-04-25 NOTE — Progress Notes (Signed)
Per Dr. [name], okay to tx without waiting for labs to result.

## 2018-04-25 NOTE — Patient Instructions (Signed)
[facility] Discharge Instructions for Patients Receiving Chemotherapy  Today you received the following chemotherapy agents: nivolumab (Opdivo)  To help prevent nausea and vomiting after your treatment, we encourage you to take your nausea medication as directed.    If you develop nausea and vomiting that is not controlled by your nausea medication, call the clinic.   BELOW ARE SYMPTOMS THAT SHOULD BE REPORTED IMMEDIATELY:  *FEVER GREATER THAN 100.5 F  *CHILLS WITH OR WITHOUT FEVER  NAUSEA AND VOMITING THAT IS NOT CONTROLLED WITH YOUR NAUSEA MEDICATION  *UNUSUAL SHORTNESS OF BREATH  *UNUSUAL BRUISING OR BLEEDING  TENDERNESS IN MOUTH AND THROAT WITH OR WITHOUT PRESENCE OF ULCERS  *URINARY PROBLEMS  *BOWEL PROBLEMS  UNUSUAL RASH Items with * indicate a potential emergency and should be followed up as soon as possible.  Feel free to call the clinic you have any questions or concerns. The clinic phone number is [phone].  Please show the [facility] at check-in to the Emergency Department and triage nurse.

## 2018-04-25 NOTE — Telephone Encounter (Signed)
Scheduled appt per 5/22 los - pt to get an updated schedule next visit.

## 2018-04-25 NOTE — Progress Notes (Signed)
[facility] [phone]   [phone]  OFFICE PROGRESS NOTE  [name], MD [place] [id number]  DIAGNOSIS: Stage IV (T1a, N0, M1b) non-small cell lung cancer, adenocarcinoma of the right lower lobe diagnosed in October 2014  Molecular profile: Negative forRET, ALK, BRAF, MET, EGFR.  Positive for ERBB2 A775T, [id number], KRAS G13E, MAP2K1 D67N (see full report).   PRIOR THERAPY:  1) Systemic chemotherapy with carboplatin for AUC of 5 and Alimta 500 mg/M2 every 3 weeks, status post 6 cycles with stable disease. First dose on 10/23/2013. 2) Maintenance systemic chemotherapy with single agent Alimta 500 mg/M2 every 3 weeks. Status post 8 cycles.  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 Sondra Come completed on 12/16/2014. 4) stereotactic radiotherapy to the enlarging right upper lobe lung nodule under the care of Dr. Sondra Come completed on 11/22/2016. 5) Immunotherapy with Nivolumab 240 mg IV every 2 weeks, status post 60 cycles. First cycle was given on 01/28/2015. Last dose 07/05/2017.  CURRENT THERAPY: Immunotherapy with Nivolumab 480 mg IV every 4 weeks. First dose 07/19/2017. Status post 10 cycles.  INTERVAL HISTORY: Anthony Curry. 77 y.o. male returns to the clinic today for follow-up visit accompanied by his wife.  The patient is feeling fine today with no concerning complaints except for some sinus issues with dryness of his nose.  He is using saline drop.  He denied having any chest pain, shortness of breath, cough or hemoptysis.  He denied having any fever or chills.  He has no nausea, vomiting, diarrhea or constipation.  He continues to tolerate his treatment with Nivolumab fairly well.  The patient had a repeat CT scan of the chest, abdomen and pelvis performed recently and he is here for evaluation and discussion of his discuss results.  MEDICAL HISTORY: Past  Medical History:  Diagnosis Date  . 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[place])   . Adrenal mass ([place])   . Alcoholism in remission ([place])   . Anemia of chronic disease   . 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 Anxiety   . BPH (benign prostatic hyperplasia)   . Cancer ([place])    small cell/lung  . Complication of anesthesia 09/08/2015    JITTERY AFTER GALLBLADDER SURGERY  . GERD (gastroesophageal reflux disease)   . History of radiation therapy 11/14/16, 11/18/16, 11/22/16   Left upper lung: 54 Gy in 3 fractions  . Hypertension   . Hypertension 12/07/2016  . Neoplastic malignant related fatigue    IV tx. every 2 weeks last9-14-16 (Dr. Earlie Server), radiation last tx. 6 months  . 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 Persistent atrial fibrillation ([place])   . Pulmonary nodules   . Radiation 12/02/14-12/16/14   Left adrenal gland 30 Gy in 10 fractions    ALLERGIES:  has No Known Allergies.  MEDICATIONS:  Current Outpatient Medications  Medication Sig Dispense Refill  . ALPRAZolam (XANAX) 0.25 MG tablet TAKE 1 TABLET BY MOUTH TWICE A DAY AS NEEDED FOR ANXIETY AND AS NEEDED FOR SLEEP 60 tablet 0  . apixaban (ELIQUIS) 5 MG TABS tablet Take 5 mg by mouth 2 (two) times daily.    . Ascorbic Acid (VITAMIN C PO) Take 1,000 mg by mouth daily.     . B Complex-C (SUPER B COMPLEX PO) Take 1 each by mouth daily.     . calcium carbonate (OS-CAL) 600 MG TABS Take 600 mg by mouth daily  with breakfast.    . demeclocycline (DECLOMYCIN) 150 MG tablet Take 150 mg by mouth 2 (two) times daily.    [name] diltiazem (CARDIZEM CD) 180 MG 24 hr capsule Take 1 capsule (180 mg total) by mouth daily at 12 noon. 90 capsule 3  . furosemide (LASIX) 40 MG tablet Take 0.5 tablets (20 mg total) by mouth every other day.    [name] HYDROcodone-acetaminophen (NORCO/VICODIN) 5-325 MG tablet Take 1 tablet by mouth every 6 (six) hours as needed for moderate pain. 30 tablet 0  . KLOR-CON M20 20 MEQ tablet Take 20 mEq by mouth every  morning.     . Lactobacillus (ACIDOPHILUS) CAPS capsule Take 1 capsule by mouth daily.    [name] levothyroxine (SYNTHROID, LEVOTHROID) 50 MCG tablet TAKE 1 TABLET (50 MCG TOTAL) BY MOUTH DAILY BEFORE BREAKFAST. 90 tablet 0  . MAGNESIUM PO Take 400 mg by mouth daily.     . Multiple Vitamin (MULTIVITAMIN WITH MINERALS) TABS Take 1 tablet by mouth daily.    . Nivolumab (OPDIVO IV) Inject as directed every 2 weeks    . Nutritional Supplements (ENSURE COMPLETE PO) Take 1 Can by mouth 2 (two) times daily.    . Omega-3 Fatty Acids (FISH OIL PO) Take 1 tablet by mouth daily.    . pantoprazole (PROTONIX) 40 MG tablet Take 40 mg by mouth every morning.  8  . tamsulosin (FLOMAX) 0.4 MG CAPS capsule Take 0.4 mg by mouth daily at 12 noon.     . thiamine (VITAMIN B-1) 100 MG tablet Take 1 tablet (100 mg total) by mouth daily. 30 tablet 6  . vitamin E 400 UNIT capsule Take 400 Units by mouth daily.     No current facility-administered medications for this visit.     SURGICAL HISTORY:  Past Surgical History:  Procedure Laterality Date  . ANKLE ARTHRODESIS  1995   rt fx-hardware in  . CARDIOVERSION N/A 09/29/2014   Procedure: CARDIOVERSION;  Surgeon: Josue Hector, MD;  Location: [facility] ENDOSCOPY;  Service: Cardiovascular;  Laterality: N/A;  . CHOLECYSTECTOMY  09/08/2015    laproscopic   . CHOLECYSTECTOMY N/A 09/08/2015   Procedure: LAPAROSCOPIC CHOLECYSTECTOMY WITH ATTEMPTED INTRAOPERATIVE CHOLANGIOGRAM ;  Surgeon: Fanny Skates, MD;  Location: [facility];  Service: General;  Laterality: N/A;  . COLONOSCOPY    . ERCP N/A 09/02/2015   Procedure: ENDOSCOPIC RETROGRADE CHOLANGIOPANCREATOGRAPHY (ERCP);  Surgeon: Arta Silence, MD;  Location: [facility] ENDOSCOPY;  Service: Endoscopy;  Laterality: N/A;  . ERCP N/A 09/07/2015   Procedure: ENDOSCOPIC RETROGRADE CHOLANGIOPANCREATOGRAPHY (ERCP);  Surgeon: Clarene Essex, MD;  Location: [facility] ENDOSCOPY;  Service: Endoscopy;  Laterality: N/A;  . EXCISION / CURETTAGE BONE CYST FINGER  2005     rt thumb  . HEMORRHOID SURGERY    . Percutaneous Cholecytostomy tube     IVR insertion 06-13-15(Dr. Vernard Gambles)- 08-28-15 remains in place to drainage bag  . TONSILLECTOMY    . TOTAL SHOULDER ARTHROPLASTY  02/03/2012   Procedure: TOTAL SHOULDER ARTHROPLASTY;  Surgeon: [name], MD;  Location: [facility];  Service: Orthopedics;  Laterality: Left;    REVIEW OF SYSTEMS:  Constitutional: positive for fatigue Eyes: negative Ears, nose, mouth, throat, and face: positive for Nasal dryness Respiratory: negative Cardiovascular: negative Gastrointestinal: negative Genitourinary:negative Integument/breast: negative Hematologic/lymphatic: negative Musculoskeletal:negative Neurological: negative Behavioral/Psych: negative Endocrine: negative Allergic/Immunologic: negative   PHYSICAL EXAMINATION: General appearance: alert, cooperative and no distress Head: Normocephalic, without obvious abnormality, atraumatic Neck: no adenopathy, no JVD, supple, symmetrical, trachea midline and thyroid not  enlarged, symmetric, no tenderness/mass/nodules Lymph nodes: Cervical, supraclavicular, and axillary nodes normal. Resp: clear to auscultation bilaterally Back: symmetric, no curvature. ROM normal. No CVA tenderness. Cardio: regular rate and rhythm, S1, S2 normal, no murmur, click, rub or gallop GI: soft, non-tender; bowel sounds normal; no masses,  no organomegaly Extremities: extremities normal, atraumatic, no cyanosis or edema Neurologic: Alert and oriented X 3, normal strength and tone. Normal symmetric reflexes. Normal coordination and gait  ECOG PERFORMANCE STATUS: 1 - Symptomatic but completely ambulatory  Blood pressure 134/69, pulse (!) 59, temperature 97.7 F (36.5 C), temperature source Oral, resp. rate 18, height 5' 11"  (1.803 m), weight 144 lb 12.8 oz (65.7 kg), SpO2 98 %.  LABORATORY DATA: Lab Results  Component Value Date   WBC 4.6 03/28/2018   HGB 14.8 03/28/2018    HCT 42.0 03/28/2018   MCV 92.1 03/28/2018   PLT 233 03/28/2018      Chemistry      Component Value Date/Time   NA 134 (L) 03/28/2018 0906   NA 132 (L) 12/06/2017 0930   K 4.6 03/28/2018 0906   K 4.0 12/06/2017 0930   CL 100 03/28/2018 0906   CO2 22 03/28/2018 0906   CO2 26 12/06/2017 0930   BUN 20 03/28/2018 0906   BUN 15.4 12/06/2017 0930   CREATININE 0.87 03/28/2018 0906   CREATININE 0.9 12/06/2017 0930      Component Value Date/Time   CALCIUM 10.3 03/28/2018 0906   CALCIUM 9.9 12/06/2017 0930   ALKPHOS 98 03/28/2018 0906   ALKPHOS 107 12/06/2017 0930   AST 31 03/28/2018 0906   AST 20 12/06/2017 0930   ALT 22 03/28/2018 0906   ALT 14 12/06/2017 0930   BILITOT 0.5 03/28/2018 0906   BILITOT 0.76 12/06/2017 0930       RADIOGRAPHIC STUDIES: Ct Chest W Contrast  Result Date: 04/23/2018 CLINICAL DATA:  Follow-up right lung cancer EXAM: CT CHEST, ABDOMEN, AND PELVIS WITH CONTRAST TECHNIQUE: Multidetector CT imaging of the chest, abdomen and pelvis was performed following the standard protocol during bolus administration of intravenous contrast. CONTRAST:  167m ISOVUE-300 IOPAMIDOL (ISOVUE-300) INJECTION 61% COMPARISON:  01/29/2018 FINDINGS: CT CHEST FINDINGS Cardiovascular: Heart is top-normal in size. Trace pericardial effusion. No evidence of thoracic aortic aneurysm. Atherosclerotic calcifications of the aortic arch. Three vessel coronary atherosclerosis. Mediastinum/Nodes: No suspicious mediastinal, hilar, or axillary lymphadenopathy. Visualized thyroid is unremarkable. Lungs/Pleura: Radiation changes in the medial left upper lobe (series 6/image 31), unchanged. Moderate centrilobular and paraseptal emphysematous changes, upper lobe predominant. Prior lingular and lateral right middle lobe opacities are significantly improved, likely infectious/inflammatory. Mild residual scarring in the lingula (series 6/image 66) with atelectasis/scarring in the lateral right middle lobe  (series 6/image 102). New focal platelike opacity in the posterior left lower lobe with underlying bronchiectasis (series 6/image 101), also likely reflecting post infectious scarring. Additional scarring/atelectasis in the bilateral lower lobes (series 6/image 126). Scattered ground-glass opacities in the lungs bilaterally, including a dominant 3.1 cm lesion in the posterior right upper lobe (series 6/image 56), previously 3.2 cm when measured in a similar fashion, unchanged. Stable cavitary lesions in the left upper lobe (series 6/image 35) and inferiorly along the right major fissure (series 6/image 118), unchanged. No pleural effusion or pneumothorax. Musculoskeletal: Degenerative changes of the thoracic spine. CT ABDOMEN PELVIS FINDINGS Hepatobiliary: Subcentimeter hypervascular lesion in the anterior right hepatic dome (series 2/image 55), likely reflecting a benign hemangioma. Scattered probable hepatic cysts measuring up to 14 mm inferiorly in segment 6 (series 2/image  48). Status post cholecystectomy. No intrahepatic or extrahepatic ductal dilatation. Pancreas: Within normal limits. Spleen: Within normal limits. Adrenals/Urinary Tract: 1.7 cm right adrenal nodule (series 2/image 62), previously 1.5 cm, similar. Left adrenal gland is within normal limits. Bilateral renal cortical scarring.  No hydronephrosis. Bladder is within normal limits. Stomach/Bowel: Stomach is within normal limits. No evidence of bowel obstruction. Appendix is not discretely visualized and may be surgically absent. Left colonic diverticulosis, without evidence of diverticulitis. Vascular/Lymphatic: No evidence of abdominal aortic aneurysm. Atherosclerotic calcifications of the abdominal aorta and branch vessels. No suspicious abdominopelvic lymphadenopathy. Reproductive: Prostate is unremarkable. Other: No abdominopelvic ascites. Musculoskeletal: Degenerative changes of the lumbar spine. Scattered lytic lesions in the lumbosacral  spine are unchanged. IMPRESSION: No evidence of new/progressive malignancy. Improving infection in the right middle lobe and lingula, with mild residual scarring/atelectasis. Stable ground-glass opacities in the lungs bilaterally, including a dominant 3.1 cm lesion in the posterior right upper lobe. 1.7 cm right adrenal nodule, grossly unchanged. Additional ancillary findings as above. Electronically Signed   By: Julian Hy M.D.   On: 04/23/2018 14:29   Ct Abdomen Pelvis W Contrast  Result Date: 04/23/2018 CLINICAL DATA:  Follow-up right lung cancer EXAM: CT CHEST, ABDOMEN, AND PELVIS WITH CONTRAST TECHNIQUE: Multidetector CT imaging of the chest, abdomen and pelvis was performed following the standard protocol during bolus administration of intravenous contrast. CONTRAST:  141m ISOVUE-300 IOPAMIDOL (ISOVUE-300) INJECTION 61% COMPARISON:  01/29/2018 FINDINGS: CT CHEST FINDINGS Cardiovascular: Heart is top-normal in size. Trace pericardial effusion. No evidence of thoracic aortic aneurysm. Atherosclerotic calcifications of the aortic arch. Three vessel coronary atherosclerosis. Mediastinum/Nodes: No suspicious mediastinal, hilar, or axillary lymphadenopathy. Visualized thyroid is unremarkable. Lungs/Pleura: Radiation changes in the medial left upper lobe (series 6/image 31), unchanged. Moderate centrilobular and paraseptal emphysematous changes, upper lobe predominant. Prior lingular and lateral right middle lobe opacities are significantly improved, likely infectious/inflammatory. Mild residual scarring in the lingula (series 6/image 66) with atelectasis/scarring in the lateral right middle lobe (series 6/image 102). New focal platelike opacity in the posterior left lower lobe with underlying bronchiectasis (series 6/image 101), also likely reflecting post infectious scarring. Additional scarring/atelectasis in the bilateral lower lobes (series 6/image 126). Scattered ground-glass opacities in the lungs  bilaterally, including a dominant 3.1 cm lesion in the posterior right upper lobe (series 6/image 56), previously 3.2 cm when measured in a similar fashion, unchanged. Stable cavitary lesions in the left upper lobe (series 6/image 35) and inferiorly along the right major fissure (series 6/image 118), unchanged. No pleural effusion or pneumothorax. Musculoskeletal: Degenerative changes of the thoracic spine. CT ABDOMEN PELVIS FINDINGS Hepatobiliary: Subcentimeter hypervascular lesion in the anterior right hepatic dome (series 2/image 55), likely reflecting a benign hemangioma. Scattered probable hepatic cysts measuring up to 14 mm inferiorly in segment 6 (series 2/image 75). Status post cholecystectomy. No intrahepatic or extrahepatic ductal dilatation. Pancreas: Within normal limits. Spleen: Within normal limits. Adrenals/Urinary Tract: 1.7 cm right adrenal nodule (series 2/image 62), previously 1.5 cm, similar. Left adrenal gland is within normal limits. Bilateral renal cortical scarring.  No hydronephrosis. Bladder is within normal limits. Stomach/Bowel: Stomach is within normal limits. No evidence of bowel obstruction. Appendix is not discretely visualized and may be surgically absent. Left colonic diverticulosis, without evidence of diverticulitis. Vascular/Lymphatic: No evidence of abdominal aortic aneurysm. Atherosclerotic calcifications of the abdominal aorta and branch vessels. No suspicious abdominopelvic lymphadenopathy. Reproductive: Prostate is unremarkable. Other: No abdominopelvic ascites. Musculoskeletal: Degenerative changes of the lumbar spine. Scattered lytic lesions in the lumbosacral spine  are unchanged. IMPRESSION: No evidence of new/progressive malignancy. Improving infection in the right middle lobe and lingula, with mild residual scarring/atelectasis. Stable ground-glass opacities in the lungs bilaterally, including a dominant 3.1 cm lesion in the posterior right upper lobe. 1.7 cm right  adrenal nodule, grossly unchanged. Additional ancillary findings as above. Electronically Signed   By: Julian Hy M.D.   On: 04/23/2018 14:29    ASSESSMENT AND PLAN:  This is a very pleasant 77 years old white male with metastatic non-small cell lung cancer, adenocarcinoma and currently on treatment with second line immunotherapy with Nivolumab status post 60 cycles and has been tolerating his treatment well. He was recently switched to Nivolumab 480 MG IV every 4 weeks status post 10 cycles. The patient continues to tolerate this treatment well with no concerning complaints. He has a repeat CT scan of the chest, abdomen and pelvis performed recently.  I personally and independently reviewed the scans and discussed the results with the patient and his wife today.  His scan showed no concerning findings for disease progression. I recommended for the patient to continue his current treatment with Nivolumab and he will proceed with cycle #11 today. I will see him back for follow-up visit in 4 weeks for evaluation before the next cycle of his treatment. He was advised to call immediately if he has any concerning symptoms in the interval. The patient voices understanding of current disease status and treatment options and is in agreement with the current care plan. All questions were answered. The patient knows to call the clinic with any problems, questions or concerns. We can certainly see the patient much sooner if necessary.  Disclaimer: This note was dictated with voice recognition software. Similar sounding words can inadvertently be transcribed and may not be corrected upon review.

## 2018-04-26 ENCOUNTER — Other Ambulatory Visit: Payer: Self-pay | Admitting: Internal Medicine

## 2018-04-26 DIAGNOSIS — C3490 Malignant neoplasm of unspecified part of unspecified bronchus or lung: Secondary | ICD-10-CM

## 2018-04-26 DIAGNOSIS — G893 Neoplasm related pain (acute) (chronic): Secondary | ICD-10-CM

## 2018-04-26 MED ORDER — HYDROCODONE-ACETAMINOPHEN 5-325 MG PO TABS: 1.0000 | ORAL_TABLET | Freq: Four times a day (QID) | ORAL | 0 refills | Status: DC | PRN

## 2018-04-27 DIAGNOSIS — H353221 Exudative age-related macular degeneration, left eye, with active choroidal neovascularization: Secondary | ICD-10-CM | POA: Diagnosis not present

## 2018-05-23 ENCOUNTER — Encounter: Payer: Self-pay | Admitting: Internal Medicine

## 2018-05-23 ENCOUNTER — Inpatient Hospital Stay: Payer: Medicare Other

## 2018-05-23 ENCOUNTER — Telehealth: Payer: Self-pay | Admitting: Internal Medicine

## 2018-05-23 ENCOUNTER — Inpatient Hospital Stay: Payer: Medicare Other | Attending: Internal Medicine | Admitting: Internal Medicine

## 2018-05-23 VITALS — BP 150/70 | HR 66 | Temp 97.7°F | Resp 18 | Ht 71.0 in | Wt 143.1 lb

## 2018-05-23 DIAGNOSIS — C3431 Malignant neoplasm of lower lobe, right bronchus or lung: Secondary | ICD-10-CM | POA: Insufficient documentation

## 2018-05-23 DIAGNOSIS — R5382 Chronic fatigue, unspecified: Secondary | ICD-10-CM

## 2018-05-23 DIAGNOSIS — R42 Dizziness and giddiness: Secondary | ICD-10-CM | POA: Diagnosis not present

## 2018-05-23 DIAGNOSIS — Z79899 Other long term (current) drug therapy: Secondary | ICD-10-CM | POA: Insufficient documentation

## 2018-05-23 DIAGNOSIS — R5383 Other fatigue: Secondary | ICD-10-CM | POA: Diagnosis not present

## 2018-05-23 DIAGNOSIS — Z5112 Encounter for antineoplastic immunotherapy: Secondary | ICD-10-CM | POA: Diagnosis not present

## 2018-05-23 DIAGNOSIS — I10 Essential (primary) hypertension: Secondary | ICD-10-CM | POA: Diagnosis not present

## 2018-05-23 DIAGNOSIS — G893 Neoplasm related pain (acute) (chronic): Secondary | ICD-10-CM

## 2018-05-23 DIAGNOSIS — C3490 Malignant neoplasm of unspecified part of unspecified bronchus or lung: Secondary | ICD-10-CM

## 2018-05-23 LAB — COMPREHENSIVE METABOLIC PANEL
ALBUMIN: 4.2 g/dL (ref 3.5–5.0)
ALT: 10 U/L (ref 0–55)
AST: 24 U/L (ref 5–34)
Alkaline Phosphatase: 99 U/L (ref 40–150)
Anion gap: 7 (ref 3–11)
BUN: 20 mg/dL (ref 7–26)
CHLORIDE: 99 mmol/L (ref 98–109)
CO2: 27 mmol/L (ref 22–29)
Calcium: 9.9 mg/dL (ref 8.4–10.4)
Creatinine, Ser: 0.85 mg/dL (ref 0.70–1.30)
GFR calc non Af Amer: >60 mL/min (ref >60)
GLUCOSE: 87 mg/dL (ref 70–140)
Potassium: 4.1 mmol/L (ref 3.5–5.1)
SODIUM: 133 mmol/L — AB (ref 136–145)
Total Bilirubin: 0.8 mg/dL (ref 0.2–1.2)
Total Protein: 6.5 g/dL (ref 6.4–8.3)

## 2018-05-23 LAB — TSH: TSH: 4.786 u[IU]/mL — AB (ref 0.320–4.118)

## 2018-05-23 LAB — CBC WITH DIFFERENTIAL/PLATELET
BASOS ABS: 0 10*3/uL (ref 0.0–0.1)
BASOS PCT: 1 %
EOS ABS: 0.3 10*3/uL (ref 0.0–0.5)
EOS PCT: 6 %
HCT: 44.6 % (ref 38.4–49.9)
HEMOGLOBIN: 15.1 g/dL (ref 13.0–17.1)
Lymphocytes Relative: 21 %
Lymphs Abs: 0.9 10*3/uL (ref 0.9–3.3)
MCH: 32.2 pg (ref 27.2–33.4)
MCHC: 33.8 g/dL (ref 32.0–36.0)
MCV: 95.2 fL (ref 79.3–98.0)
Monocytes Absolute: 0.5 10*3/uL (ref 0.1–0.9)
Monocytes Relative: 12 %
NEUTROS PCT: 60 %
Neutro Abs: 2.7 10*3/uL (ref 1.5–6.5)
PLATELETS: 202 10*3/uL (ref 140–400)
RBC: 4.69 MIL/uL (ref 4.20–5.82)
RDW: 14.9 % — ABNORMAL HIGH (ref 11.0–14.6)
WBC: 4.5 10*3/uL (ref 4.0–10.3)

## 2018-05-23 MED ORDER — HYDROCODONE-ACETAMINOPHEN 5-325 MG PO TABS: 1.0000 | ORAL_TABLET | Freq: Four times a day (QID) | ORAL | 0 refills | Status: DC | PRN

## 2018-05-23 MED ORDER — SODIUM CHLORIDE 0.9 % IV SOLN
480.0000 mg | Freq: Once | INTRAVENOUS | Status: AC
  Administered 2018-05-23: 480 mg via INTRAVENOUS
  Filled 2018-05-23: qty 48

## 2018-05-23 MED ORDER — SODIUM CHLORIDE 0.9 % IV SOLN
Freq: Once | INTRAVENOUS | Status: AC
  Administered 2018-05-23: 13:00:00 via INTRAVENOUS

## 2018-05-23 NOTE — Patient Instructions (Signed)
[facility] Discharge Instructions for Patients Receiving Chemotherapy  Today you received the following chemotherapy agents: nivolumab (Opdivo)  To help prevent nausea and vomiting after your treatment, we encourage you to take your nausea medication as directed.    If you develop nausea and vomiting that is not controlled by your nausea medication, call the clinic.   BELOW ARE SYMPTOMS THAT SHOULD BE REPORTED IMMEDIATELY:  *FEVER GREATER THAN 100.5 F  *CHILLS WITH OR WITHOUT FEVER  NAUSEA AND VOMITING THAT IS NOT CONTROLLED WITH YOUR NAUSEA MEDICATION  *UNUSUAL SHORTNESS OF BREATH  *UNUSUAL BRUISING OR BLEEDING  TENDERNESS IN MOUTH AND THROAT WITH OR WITHOUT PRESENCE OF ULCERS  *URINARY PROBLEMS  *BOWEL PROBLEMS  UNUSUAL RASH Items with * indicate a potential emergency and should be followed up as soon as possible.  Feel free to call the clinic you have any questions or concerns. The clinic phone number is [phone].  Please show the [facility] at check-in to the Emergency Department and triage nurse.

## 2018-05-23 NOTE — Progress Notes (Signed)
[place] [phone]   [phone]  OFFICE PROGRESS NOTE  [name], MD [place] [id number]  DIAGNOSIS: Stage IV (T1a, N0, M1b) non-small cell lung cancer, adenocarcinoma of the right lower lobe diagnosed in October 2014  Molecular profile: Negative forRET, ALK, BRAF, MET, EGFR.  Positive for ERBB2 A775T, [id number], KRAS G13E, MAP2K1 D67N (see full report).   PRIOR THERAPY:  1) Systemic chemotherapy with carboplatin for AUC of 5 and Alimta 500 mg/M2 every 3 weeks, status post 6 cycles with stable disease. First dose on 10/23/2013. 2) Maintenance systemic chemotherapy with single agent Alimta 500 mg/M2 every 3 weeks. Status post 8 cycles.  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 Sondra Come completed on 12/16/2014. 4) stereotactic radiotherapy to the enlarging right upper lobe lung nodule under the care of Dr. Sondra Come completed on 11/22/2016. 5) Immunotherapy with Nivolumab 240 mg IV every 2 weeks, status post 60 cycles. First cycle was given on 01/28/2015. Last dose 07/05/2017.  CURRENT THERAPY: Immunotherapy with Nivolumab 480 mg IV every 4 weeks. First dose 07/19/2017. Status post 11 cycles.  INTERVAL HISTORY: Anthony Curry. 77 y.o. male returns to the clinic today for follow-up visit accompanied by his wife.  The patient is feeling fine today with no specific complaints except for the persistent fatigue and occasional dizzy spells.  He denied having any nausea, vomiting, diarrhea or constipation.  He did not eat well the last few weeks and he lost few pounds.  He denied having any fever or chills.  He has no headache or visual changes.  He has no chest pain, shortness breath, cough or hemoptysis.  He is here today for evaluation before starting cycle #12 of his treatment with immunotherapy.  MEDICAL HISTORY: Past Medical History:  Diagnosis Date  . Adrenal  hemorrhage ([place])   . Adrenal mass ([place])   . Alcoholism in remission ([place])   . Anemia of chronic disease   . 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 Anxiety   . BPH (benign prostatic hyperplasia)   . Cancer ([place])    small cell/lung  . Complication of anesthesia 09/08/2015    JITTERY AFTER GALLBLADDER SURGERY  . GERD (gastroesophageal reflux disease)   . History of radiation therapy 11/14/16, 11/18/16, 11/22/16   Left upper lung: 54 Gy in 3 fractions  . Hypertension   . Hypertension 12/07/2016  . Neoplastic malignant related fatigue    IV tx. every 2 weeks last9-14-16 (Dr. Earlie Server), radiation last tx. 6 months  . 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 Persistent atrial fibrillation ([place])   . Pulmonary nodules   . Radiation 12/02/14-12/16/14   Left adrenal gland 30 Gy in 10 fractions    ALLERGIES:  has No Known Allergies.  MEDICATIONS:  Current Outpatient Medications  Medication Sig Dispense Refill  . ALPRAZolam (XANAX) 0.25 MG tablet TAKE 1 TABLET BY MOUTH TWICE A DAY AS NEEDED FOR ANXIETY AND AS NEEDED FOR SLEEP 60 tablet 0  . apixaban (ELIQUIS) 5 MG TABS tablet Take 5 mg by mouth 2 (two) times daily.    . Ascorbic Acid (VITAMIN C PO) Take 1,000 mg by mouth daily.     . B Complex-C (SUPER B COMPLEX PO) Take 1 each by mouth daily.     . calcium carbonate (OS-CAL) 600 MG TABS Take 600 mg by mouth daily with breakfast.    . demeclocycline (DECLOMYCIN)  150 MG tablet Take 150 mg by mouth 2 (two) times daily.    [name] diltiazem (CARDIZEM CD) 180 MG 24 hr capsule Take 1 capsule (180 mg total) by mouth daily at 12 noon. 90 capsule 3  . furosemide (LASIX) 40 MG tablet Take 0.5 tablets (20 mg total) by mouth every other day.    [name] HYDROcodone-acetaminophen (NORCO/VICODIN) 5-325 MG tablet Take 1 tablet by mouth every 6 (six) hours as needed for moderate pain. 30 tablet 0  . KLOR-CON M20 20 MEQ tablet Take 20 mEq by mouth every morning.     . Lactobacillus (ACIDOPHILUS) CAPS  capsule Take 1 capsule by mouth daily.    [name] levothyroxine (SYNTHROID, LEVOTHROID) 50 MCG tablet TAKE 1 TABLET (50 MCG TOTAL) BY MOUTH DAILY BEFORE BREAKFAST. 90 tablet 0  . MAGNESIUM PO Take 400 mg by mouth daily.     . Multiple Vitamin (MULTIVITAMIN WITH MINERALS) TABS Take 1 tablet by mouth daily.    . Nivolumab (OPDIVO IV) Inject as directed every 2 weeks    . Nutritional Supplements (ENSURE COMPLETE PO) Take 1 Can by mouth 2 (two) times daily.    . Omega-3 Fatty Acids (FISH OIL PO) Take 1 tablet by mouth daily.    . pantoprazole (PROTONIX) 40 MG tablet Take 40 mg by mouth every morning.  8  . tamsulosin (FLOMAX) 0.4 MG CAPS capsule Take 0.4 mg by mouth daily at 12 noon.     . thiamine (VITAMIN B-1) 100 MG tablet Take 1 tablet (100 mg total) by mouth daily. 30 tablet 6  . vitamin E 400 UNIT capsule Take 400 Units by mouth daily.     No current facility-administered medications for this visit.     SURGICAL HISTORY:  Past Surgical History:  Procedure Laterality Date  . ANKLE ARTHRODESIS  1995   rt fx-hardware in  . CARDIOVERSION N/A 09/29/2014   Procedure: CARDIOVERSION;  Surgeon: Josue Hector, MD;  Location[facility] ENDOSCOPY;  Service: Cardiovascular;  Laterality: N/A;  . CHOLECYSTECTOMY  09/08/2015    laproscopic   . CHOLECYSTECTOMY N/A 09/08/2015   Procedure: LAPAROSCOPIC CHOLECYSTECTOMY WITH ATTEMPTED INTRAOPERATIVE CHOLANGIOGRAM ;  Surgeon: Fanny Skates, MD;  Location: [facility];  Service: General;  Laterality: N/A;  . COLONOSCOPY    . ERCP N/A 09/02/2015   Procedure: ENDOSCOPIC RETROGRADE CHOLANGIOPANCREATOGRAPHY (ERCP);  Surgeon: Arta Silence, MD;  Location: [facility] ENDOSCOPY;  Service: Endoscopy;  Laterality: N/A;  . ERCP N/A 09/07/2015   Procedure: ENDOSCOPIC RETROGRADE CHOLANGIOPANCREATOGRAPHY (ERCP);  Surgeon: Clarene Essex, MD;  Location: [facility] ENDOSCOPY;  Service: Endoscopy;  Laterality: N/A;  . EXCISION / CURETTAGE BONE CYST FINGER  2005   rt thumb  . HEMORRHOID SURGERY    .  Percutaneous Cholecytostomy tube     IVR insertion 06-13-15(Dr. Vernard Gambles)- 08-28-15 remains in place to drainage bag  . TONSILLECTOMY    . TOTAL SHOULDER ARTHROPLASTY  02/03/2012   Procedure: TOTAL SHOULDER ARTHROPLASTY;  Surgeon: [name], MD;  Location: [facility];  Service: Orthopedics;  Laterality: Left;    REVIEW OF SYSTEMS:  A comprehensive review of systems was negative except for: Constitutional: positive for fatigue Neurological: positive for dizziness   PHYSICAL EXAMINATION: General appearance: alert, cooperative, fatigued and no distress Head: Normocephalic, without obvious abnormality, atraumatic Neck: no adenopathy, no JVD, supple, symmetrical, trachea midline and thyroid not enlarged, symmetric, no tenderness/mass/nodules Lymph nodes: Cervical, supraclavicular, and axillary nodes normal. Resp: clear to auscultation bilaterally Back: symmetric, no curvature. ROM normal. No CVA tenderness. Cardio:  regular rate and rhythm, S1, S2 normal, no murmur, click, rub or gallop GI: soft, non-tender; bowel sounds normal; no masses,  no organomegaly Extremities: extremities normal, atraumatic, no cyanosis or edema  ECOG PERFORMANCE STATUS: 1 - Symptomatic but completely ambulatory  Blood pressure (!) 150/70, pulse 66, temperature 97.7 F (36.5 C), temperature source Oral, resp. rate 18, height 5' 11" (1.803 m), weight 143 lb 1.6 oz (64.9 kg), SpO2 97 %.  LABORATORY DATA: Lab Results  Component Value Date   WBC 4.5 05/23/2018   HGB 15.1 05/23/2018   HCT 44.6 05/23/2018   MCV 95.2 05/23/2018   PLT 202 05/23/2018      Chemistry      Component Value Date/Time   NA 133 (L) 04/25/2018 1147   NA 132 (L) 12/06/2017 0930   K 4.2 04/25/2018 1147   K 4.0 12/06/2017 0930   CL 101 04/25/2018 1147   CO2 24 04/25/2018 1147   CO2 26 12/06/2017 0930   BUN 17 04/25/2018 1147   BUN 15.4 12/06/2017 0930   CREATININE 0.81 04/25/2018 1147   CREATININE 0.9 12/06/2017 0930       Component Value Date/Time   CALCIUM 9.6 04/25/2018 1147   CALCIUM 9.9 12/06/2017 0930   ALKPHOS 91 04/25/2018 1147   ALKPHOS 107 12/06/2017 0930   AST 24 04/25/2018 1147   AST 20 12/06/2017 0930   ALT 15 04/25/2018 1147   ALT 14 12/06/2017 0930   BILITOT 0.7 04/25/2018 1147   BILITOT 0.76 12/06/2017 0930       RADIOGRAPHIC STUDIES: Ct Chest W Contrast  Result Date: 04/23/2018 CLINICAL DATA:  Follow-up right lung cancer EXAM: CT CHEST, ABDOMEN, AND PELVIS WITH CONTRAST TECHNIQUE: Multidetector CT imaging of the chest, abdomen and pelvis was performed following the standard protocol during bolus administration of intravenous contrast. CONTRAST:  16m ISOVUE-300 IOPAMIDOL (ISOVUE-300) INJECTION 61% COMPARISON:  01/29/2018 FINDINGS: CT CHEST FINDINGS Cardiovascular: Heart is top-normal in size. Trace pericardial effusion. No evidence of thoracic aortic aneurysm. Atherosclerotic calcifications of the aortic arch. Three vessel coronary atherosclerosis. Mediastinum/Nodes: No suspicious mediastinal, hilar, or axillary lymphadenopathy. Visualized thyroid is unremarkable. Lungs/Pleura: Radiation changes in the medial left upper lobe (series 6/image 31), unchanged. Moderate centrilobular and paraseptal emphysematous changes, upper lobe predominant. Prior lingular and lateral right middle lobe opacities are significantly improved, likely infectious/inflammatory. Mild residual scarring in the lingula (series 6/image 66) with atelectasis/scarring in the lateral right middle lobe (series 6/image 102). New focal platelike opacity in the posterior left lower lobe with underlying bronchiectasis (series 6/image 101), also likely reflecting post infectious scarring. Additional scarring/atelectasis in the bilateral lower lobes (series 6/image 126). Scattered ground-glass opacities in the lungs bilaterally, including a dominant 3.1 cm lesion in the posterior right upper lobe (series 6/image 56), previously 3.2 cm  when measured in a similar fashion, unchanged. Stable cavitary lesions in the left upper lobe (series 6/image 35) and inferiorly along the right major fissure (series 6/image 118), unchanged. No pleural effusion or pneumothorax. Musculoskeletal: Degenerative changes of the thoracic spine. CT ABDOMEN PELVIS FINDINGS Hepatobiliary: Subcentimeter hypervascular lesion in the anterior right hepatic dome (series 2/image 55), likely reflecting a benign hemangioma. Scattered probable hepatic cysts measuring up to 14 mm inferiorly in segment 6 (series 2/image 75). Status post cholecystectomy. No intrahepatic or extrahepatic ductal dilatation. Pancreas: Within normal limits. Spleen: Within normal limits. Adrenals/Urinary Tract: 1.7 cm right adrenal nodule (series 2/image 62), previously 1.5 cm, similar. Left adrenal gland is within normal limits. Bilateral renal cortical scarring.  No hydronephrosis. Bladder is within normal limits. Stomach/Bowel: Stomach is within normal limits. No evidence of bowel obstruction. Appendix is not discretely visualized and may be surgically absent. Left colonic diverticulosis, without evidence of diverticulitis. Vascular/Lymphatic: No evidence of abdominal aortic aneurysm. Atherosclerotic calcifications of the abdominal aorta and branch vessels. No suspicious abdominopelvic lymphadenopathy. Reproductive: Prostate is unremarkable. Other: No abdominopelvic ascites. Musculoskeletal: Degenerative changes of the lumbar spine. Scattered lytic lesions in the lumbosacral spine are unchanged. IMPRESSION: No evidence of new/progressive malignancy. Improving infection in the right middle lobe and lingula, with mild residual scarring/atelectasis. Stable ground-glass opacities in the lungs bilaterally, including a dominant 3.1 cm lesion in the posterior right upper lobe. 1.7 cm right adrenal nodule, grossly unchanged. Additional ancillary findings as above. Electronically Signed   By: Julian Hy M.D.    On: 04/23/2018 14:29   Ct Abdomen Pelvis W Contrast  Result Date: 04/23/2018 CLINICAL DATA:  Follow-up right lung cancer EXAM: CT CHEST, ABDOMEN, AND PELVIS WITH CONTRAST TECHNIQUE: Multidetector CT imaging of the chest, abdomen and pelvis was performed following the standard protocol during bolus administration of intravenous contrast. CONTRAST:  188m ISOVUE-300 IOPAMIDOL (ISOVUE-300) INJECTION 61% COMPARISON:  01/29/2018 FINDINGS: CT CHEST FINDINGS Cardiovascular: Heart is top-normal in size. Trace pericardial effusion. No evidence of thoracic aortic aneurysm. Atherosclerotic calcifications of the aortic arch. Three vessel coronary atherosclerosis. Mediastinum/Nodes: No suspicious mediastinal, hilar, or axillary lymphadenopathy. Visualized thyroid is unremarkable. Lungs/Pleura: Radiation changes in the medial left upper lobe (series 6/image 31), unchanged. Moderate centrilobular and paraseptal emphysematous changes, upper lobe predominant. Prior lingular and lateral right middle lobe opacities are significantly improved, likely infectious/inflammatory. Mild residual scarring in the lingula (series 6/image 66) with atelectasis/scarring in the lateral right middle lobe (series 6/image 102). New focal platelike opacity in the posterior left lower lobe with underlying bronchiectasis (series 6/image 101), also likely reflecting post infectious scarring. Additional scarring/atelectasis in the bilateral lower lobes (series 6/image 126). Scattered ground-glass opacities in the lungs bilaterally, including a dominant 3.1 cm lesion in the posterior right upper lobe (series 6/image 56), previously 3.2 cm when measured in a similar fashion, unchanged. Stable cavitary lesions in the left upper lobe (series 6/image 35) and inferiorly along the right major fissure (series 6/image 118), unchanged. No pleural effusion or pneumothorax. Musculoskeletal: Degenerative changes of the thoracic spine. CT ABDOMEN PELVIS FINDINGS  Hepatobiliary: Subcentimeter hypervascular lesion in the anterior right hepatic dome (series 2/image 55), likely reflecting a benign hemangioma. Scattered probable hepatic cysts measuring up to 14 mm inferiorly in segment 6 (series 2/image 75). Status post cholecystectomy. No intrahepatic or extrahepatic ductal dilatation. Pancreas: Within normal limits. Spleen: Within normal limits. Adrenals/Urinary Tract: 1.7 cm right adrenal nodule (series 2/image 62), previously 1.5 cm, similar. Left adrenal gland is within normal limits. Bilateral renal cortical scarring.  No hydronephrosis. Bladder is within normal limits. Stomach/Bowel: Stomach is within normal limits. No evidence of bowel obstruction. Appendix is not discretely visualized and may be surgically absent. Left colonic diverticulosis, without evidence of diverticulitis. Vascular/Lymphatic: No evidence of abdominal aortic aneurysm. Atherosclerotic calcifications of the abdominal aorta and branch vessels. No suspicious abdominopelvic lymphadenopathy. Reproductive: Prostate is unremarkable. Other: No abdominopelvic ascites. Musculoskeletal: Degenerative changes of the lumbar spine. Scattered lytic lesions in the lumbosacral spine are unchanged. IMPRESSION: No evidence of new/progressive malignancy. Improving infection in the right middle lobe and lingula, with mild residual scarring/atelectasis. Stable ground-glass opacities in the lungs bilaterally, including a dominant 3.1 cm lesion in the posterior right upper lobe. 1.7 cm right adrenal  nodule, grossly unchanged. Additional ancillary findings as above. Electronically Signed   By: Julian Hy M.D.   On: 04/23/2018 14:29    ASSESSMENT AND PLAN:  This is a very pleasant 77 years old white male with metastatic non-small cell lung cancer, adenocarcinoma and currently on treatment with second line immunotherapy with Nivolumab status post 60 cycles and has been tolerating his treatment well. He was recently  switched to Nivolumab 480 MG IV every 4 weeks status post 11 cycles. The patient continues to tolerate his treatment well with no concerning complaints.  I recommended for him to proceed with cycle #12 today as a schedule. I will see him back for follow-up visit in 4 weeks for evaluation before the next cycle of his treatment. For hypertension he was advised to take his blood pressure medication and consult with his primary care physician for adjustment if needed. The patient was given refill for Vicodin for pain management. He was advised to call immediately if he has any concerning symptoms in the interval. The patient voices understanding of current disease status and treatment options and is in agreement with the current care plan. All questions were answered. The patient knows to call the clinic with any problems, questions or concerns. We can certainly see the patient much sooner if necessary.  Disclaimer: This note was dictated with voice recognition software. Similar sounding words can inadvertently be transcribed and may not be corrected upon review.

## 2018-05-23 NOTE — Telephone Encounter (Signed)
Scheduled appt per 6/19 los - pt to get an updated schedule in the treatment area.

## 2018-05-28 DIAGNOSIS — H353221 Exudative age-related macular degeneration, left eye, with active choroidal neovascularization: Secondary | ICD-10-CM | POA: Diagnosis not present

## 2018-05-30 ENCOUNTER — Other Ambulatory Visit: Payer: Self-pay | Admitting: Internal Medicine

## 2018-05-30 DIAGNOSIS — C3490 Malignant neoplasm of unspecified part of unspecified bronchus or lung: Secondary | ICD-10-CM

## 2018-05-30 DIAGNOSIS — G893 Neoplasm related pain (acute) (chronic): Secondary | ICD-10-CM

## 2018-06-11 ENCOUNTER — Telehealth: Payer: Self-pay

## 2018-06-11 NOTE — Telephone Encounter (Signed)
Called to verify that her appointment and her husband appointment are both scheduled for the same day. Per 7/8 phone request

## 2018-06-19 ENCOUNTER — Other Ambulatory Visit: Payer: Self-pay | Admitting: Oncology

## 2018-06-19 DIAGNOSIS — R530 Neoplastic (malignant) related fatigue: Secondary | ICD-10-CM

## 2018-06-19 DIAGNOSIS — Z5112 Encounter for antineoplastic immunotherapy: Secondary | ICD-10-CM

## 2018-06-19 DIAGNOSIS — C3431 Malignant neoplasm of lower lobe, right bronchus or lung: Secondary | ICD-10-CM

## 2018-06-20 ENCOUNTER — Inpatient Hospital Stay: Payer: Medicare Other

## 2018-06-20 ENCOUNTER — Inpatient Hospital Stay: Payer: Medicare Other | Attending: Internal Medicine | Admitting: Oncology

## 2018-06-20 ENCOUNTER — Encounter: Payer: Self-pay | Admitting: Oncology

## 2018-06-20 ENCOUNTER — Telehealth: Payer: Self-pay | Admitting: Oncology

## 2018-06-20 VITALS — BP 147/77 | HR 81 | Temp 97.8°F | Resp 18 | Ht 71.0 in | Wt 144.1 lb

## 2018-06-20 DIAGNOSIS — C3490 Malignant neoplasm of unspecified part of unspecified bronchus or lung: Secondary | ICD-10-CM

## 2018-06-20 DIAGNOSIS — Z79899 Other long term (current) drug therapy: Secondary | ICD-10-CM | POA: Diagnosis not present

## 2018-06-20 DIAGNOSIS — R52 Pain, unspecified: Secondary | ICD-10-CM | POA: Insufficient documentation

## 2018-06-20 DIAGNOSIS — I10 Essential (primary) hypertension: Secondary | ICD-10-CM | POA: Diagnosis not present

## 2018-06-20 DIAGNOSIS — C3431 Malignant neoplasm of lower lobe, right bronchus or lung: Secondary | ICD-10-CM | POA: Diagnosis not present

## 2018-06-20 DIAGNOSIS — Z5112 Encounter for antineoplastic immunotherapy: Secondary | ICD-10-CM | POA: Insufficient documentation

## 2018-06-20 DIAGNOSIS — R530 Neoplastic (malignant) related fatigue: Secondary | ICD-10-CM

## 2018-06-20 DIAGNOSIS — G893 Neoplasm related pain (acute) (chronic): Secondary | ICD-10-CM

## 2018-06-20 LAB — CBC WITH DIFFERENTIAL (CANCER CENTER ONLY)
BASOS ABS: 0 10*3/uL (ref 0.0–0.1)
Basophils Relative: 1 %
EOS ABS: 0.3 10*3/uL (ref 0.0–0.5)
EOS PCT: 7 %
HCT: 41.7 % (ref 38.4–49.9)
HEMOGLOBIN: 14.4 g/dL (ref 13.0–17.1)
LYMPHS PCT: 22 %
Lymphs Abs: 0.8 10*3/uL — ABNORMAL LOW (ref 0.9–3.3)
MCH: 32.8 pg (ref 27.2–33.4)
MCHC: 34.6 g/dL (ref 32.0–36.0)
MCV: 94.7 fL (ref 79.3–98.0)
Monocytes Absolute: 0.5 10*3/uL (ref 0.1–0.9)
Monocytes Relative: 14 %
NEUTROS PCT: 56 %
Neutro Abs: 2.2 10*3/uL (ref 1.5–6.5)
PLATELETS: 223 10*3/uL (ref 140–400)
RBC: 4.4 MIL/uL (ref 4.20–5.82)
RDW: 14.7 % — ABNORMAL HIGH (ref 11.0–14.6)
WBC: 3.9 10*3/uL — AB (ref 4.0–10.3)

## 2018-06-20 LAB — CMP (CANCER CENTER ONLY)
ALT: 16 U/L (ref 0–44)
AST: 23 U/L (ref 15–41)
Albumin: 4.2 g/dL (ref 3.5–5.0)
Alkaline Phosphatase: 85 U/L (ref 38–126)
Anion gap: 7 (ref 5–15)
BILIRUBIN TOTAL: 0.8 mg/dL (ref 0.3–1.2)
BUN: 18 mg/dL (ref 8–23)
CO2: 26 mmol/L (ref 22–32)
Calcium: 9.9 mg/dL (ref 8.9–10.3)
Chloride: 101 mmol/L (ref 98–111)
Creatinine: 0.8 mg/dL (ref 0.61–1.24)
Glucose, Bld: 80 mg/dL (ref 70–99)
Potassium: 4.2 mmol/L (ref 3.5–5.1)
Sodium: 134 mmol/L — ABNORMAL LOW (ref 135–145)
TOTAL PROTEIN: 6.4 g/dL — AB (ref 6.5–8.1)

## 2018-06-20 LAB — TSH: TSH: 3.394 u[IU]/mL (ref 0.320–4.118)

## 2018-06-20 MED ORDER — HYDROCODONE-ACETAMINOPHEN 5-325 MG PO TABS: 1.0000 | ORAL_TABLET | Freq: Four times a day (QID) | ORAL | 0 refills | Status: DC | PRN

## 2018-06-20 MED ORDER — SODIUM CHLORIDE 0.9 % IV SOLN
480.0000 mg | Freq: Once | INTRAVENOUS | Status: AC
  Administered 2018-06-20: 480 mg via INTRAVENOUS
  Filled 2018-06-20: qty 48

## 2018-06-20 MED ORDER — SODIUM CHLORIDE 0.9 % IV SOLN
Freq: Once | INTRAVENOUS | Status: AC
  Administered 2018-06-20: 11:00:00 via INTRAVENOUS

## 2018-06-20 NOTE — Telephone Encounter (Signed)
Scheduled appt per 7/17 los - pt to get an updated schedule in treatment area.

## 2018-06-20 NOTE — Progress Notes (Signed)
[place] OFFICE PROGRESS NOTE  [name], MD [place] [address]  DIAGNOSIS: Stage IV (T1a, N0, M1b) non-small cell lung cancer, adenocarcinoma of the right lower lobe diagnosed in October 2014  Molecular profile: Negative forRET, ALK, BRAF, MET, EGFR.  Positive for ERBB2 A775T, [id number], KRAS G13E, MAP2K1 D67N (see full report).   PRIOR THERAPY:  1) Systemic chemotherapy with carboplatin for AUC of 5 and Alimta 500 mg/M2 every 3 weeks, status post 6 cycles with stable disease. First dose on 10/23/2013. 2) Maintenance systemic chemotherapy with single agent Alimta 500 mg/M2 every 3 weeks. Status post 8 cycles.  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 Sondra Come completed on 12/16/2014. 4) stereotactic radiotherapy to the enlarging right upper lobe lung nodule under the care of Dr. Sondra Come completed on 11/22/2016. 5) Immunotherapy with Nivolumab 240 mg IV every 2 weeks, status post 60 cycles. First cycle was given on 01/28/2015. Last dose 07/05/2017.  CURRENT THERAPY: Immunotherapy with Nivolumab 480 mg IV every 4 weeks. First dose 07/19/2017. Status post 12 cycles.  INTERVAL HISTORY: Anthony Curry. 77 y.o. male returns for routine follow-up visit accompanied by his wife.  The patient is feeling fine today and has no specific complaints today for ongoing neuropathic pain and intermittent dizziness.  This is unchanged from previous reports.  He uses hydrocodone at bedtime to help with his neuropathic pain.  He requests a refill today.  Patient denies fevers and chills.  Denies chest pain, shortness of breath, cough, hemoptysis.  Denies nausea, vomiting, constipation, diarrhea.  Denies recent weight loss or night sweats.  The patient is here for evaluation prior to starting cycle #13 of his treatment with immunotherapy.  MEDICAL HISTORY: Past Medical History:  Diagnosis Date  . Adrenal  hemorrhage ([place])   . Adrenal mass ([place])   . Alcoholism in remission ([place])   . Anemia of chronic disease   . 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 Anxiety   . BPH (benign prostatic hyperplasia)   . Cancer ([place])    small cell/lung  . Complication of anesthesia 09/08/2015    JITTERY AFTER GALLBLADDER SURGERY  . GERD (gastroesophageal reflux disease)   . History of radiation therapy 11/14/16, 11/18/16, 11/22/16   Left upper lung: 54 Gy in 3 fractions  . Hypertension   . Hypertension 12/07/2016  . Neoplastic malignant related fatigue    IV tx. every 2 weeks last9-14-16 (Dr. Earlie Server), radiation last tx. 6 months  . 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 Persistent atrial fibrillation ([place])   . Pulmonary nodules   . Radiation 12/02/14-12/16/14   Left adrenal gland 30 Gy in 10 fractions    ALLERGIES:  has No Known Allergies.  MEDICATIONS:  Current Outpatient Medications  Medication Sig Dispense Refill  . ALPRAZolam (XANAX) 0.25 MG tablet TAKE 1 TABLET BY MOUTH TWICE A DAY AS NEEDED FOR ANXIETY AND AS NEEDED FOR SLEEP 60 tablet 0  . apixaban (ELIQUIS) 5 MG TABS tablet Take 5 mg by mouth 2 (two) times daily.    . Ascorbic Acid (VITAMIN C PO) Take 1,000 mg by mouth daily.     . B Complex-C (SUPER B COMPLEX PO) Take 1 each by mouth daily.     . calcium carbonate (OS-CAL) 600 MG TABS Take 600 mg by mouth daily with breakfast.    . demeclocycline (DECLOMYCIN) 150 MG tablet Take 150 mg by mouth 2 (two) times  daily.    . diltiazem (CARDIZEM CD) 180 MG 24 hr capsule Take 1 capsule (180 mg total) by mouth daily at 12 noon. 90 capsule 3  . furosemide (LASIX) 40 MG tablet Take 0.5 tablets (20 mg total) by mouth every other day.    [name] HYDROcodone-acetaminophen (NORCO/VICODIN) 5-325 MG tablet Take 1 tablet by mouth every 6 (six) hours as needed for moderate pain. 40 tablet 0  . KLOR-CON M20 20 MEQ tablet Take 20 mEq by mouth every morning.     . Lactobacillus (ACIDOPHILUS) CAPS  capsule Take 1 capsule by mouth daily.    [name] levothyroxine (SYNTHROID, LEVOTHROID) 50 MCG tablet TAKE 1 TABLET (50 MCG TOTAL) BY MOUTH DAILY BEFORE BREAKFAST. 90 tablet 0  . MAGNESIUM PO Take 400 mg by mouth daily.     . Multiple Vitamin (MULTIVITAMIN WITH MINERALS) TABS Take 1 tablet by mouth daily.    . Nivolumab (OPDIVO IV) Inject as directed every 2 weeks    . Nutritional Supplements (ENSURE COMPLETE PO) Take 1 Can by mouth 2 (two) times daily.    . Omega-3 Fatty Acids (FISH OIL PO) Take 1 tablet by mouth daily.    . pantoprazole (PROTONIX) 40 MG tablet Take 40 mg by mouth every morning.  8  . tamsulosin (FLOMAX) 0.4 MG CAPS capsule Take 0.4 mg by mouth daily at 12 noon.     . thiamine (VITAMIN B-1) 100 MG tablet Take 1 tablet (100 mg total) by mouth daily. 30 tablet 6  . vitamin E 400 UNIT capsule Take 400 Units by mouth daily.     No current facility-administered medications for this visit.     SURGICAL HISTORY:  Past Surgical History:  Procedure Laterality Date  . ANKLE ARTHRODESIS  1995   rt fx-hardware in  . CARDIOVERSION N/A 09/29/2014   Procedure: CARDIOVERSION;  Surgeon: Josue Hector, MD;  Location: [facility] ENDOSCOPY;  Service: Cardiovascular;  Laterality: N/A;  . CHOLECYSTECTOMY  09/08/2015    laproscopic   . CHOLECYSTECTOMY N/A 09/08/2015   Procedure: LAPAROSCOPIC CHOLECYSTECTOMY WITH ATTEMPTED INTRAOPERATIVE CHOLANGIOGRAM ;  Surgeon: Fanny Skates, MD;  Location: [facility];  Service: General;  Laterality: N/A;  . COLONOSCOPY    . ERCP N/A 09/02/2015   Procedure: ENDOSCOPIC RETROGRADE CHOLANGIOPANCREATOGRAPHY (ERCP);  Surgeon: Arta Silence, MD;  Location: [facility] END[facility];  Service: Endoscopy;  Laterality: N/A;  . ERCP N/A 09/07/2015   Procedure: ENDOSCOPIC RETROGRADE CHOLANGIOPANCREATOGRAPHY (ERCP);  Surgeon: Clarene Essex, MD;  Location: [facility] ENDOS[facility];  Service: Endoscopy;  Laterality: N/A;  . EXCISION / CURETTAGE BONE CYST FINGER  2005   rt thumb  . HEMORRHOID SURGERY    .  Percutaneous Cholecytostomy tube     IVR insertion 06-13-15(Dr. Vernard Gambles)- 08-28-15 remains in place to drainage bag  . TONSILLECTOMY    . TOTAL SHOULDER ARTHROPLASTY  02/03/2012   Procedure: TOTAL SHOULDER ARTHROPLASTY;  Surgeon: [name], MD;  Location: [facility];  Service: Orthopedics;  Laterality: Left;    REVIEW OF SYSTEMS:   Review of Systems  Constitutional: Negative for appetite change, chills, fever and unexpected weight change. Positive for mild fatigue. HENT:   Negative for mouth sores, nosebleeds, sore throat and trouble swallowing.   Eyes: Negative for eye problems and icterus.  Respiratory: Negative for cough, hemoptysis, shortness of breath and wheezing.   Cardiovascular: Negative for chest pain and leg swelling.  Gastrointestinal: Negative for abdominal pain, constipation, diarrhea, nausea and vomiting.  Genitourinary: Negative for bladder incontinence, difficulty urinating, dysuria, frequency and  hematuria.   Musculoskeletal: Negative for back pain, neck pain and neck stiffness.  Skin: Negative for itching and rash.  Neurological: Negative for extremity weakness, headaches, light-headedness and seizures. Positive for intermittent dizziness and neuropathic pain to his feet which is unchanged. Hematological: Negative for adenopathy. Does not bruise/bleed easily.  Psychiatric/Behavioral: Negative for confusion, depression and sleep disturbance. The patient is not nervous/anxious.     PHYSICAL EXAMINATION:  Blood pressure (!) 147/77, pulse 81, temperature 97.8 F (36.6 C), temperature source Oral, resp. rate 18, height 5' 11"  (1.803 m), weight 144 lb 1.6 oz (65.4 kg), SpO2 98 %.  ECOG PERFORMANCE STATUS: 1 - Symptomatic but completely ambulatory  Physical Exam  Constitutional: Oriented to person, place, and time and well-developed, well-nourished, and in no distress. No distress.  HENT:  Head: Normocephalic and atraumatic.  Mouth/Throat: Oropharynx is  clear and moist. No oropharyngeal exudate.  Eyes: Conjunctivae are normal. Right eye exhibits no discharge. Left eye exhibits no discharge. No scleral icterus.  Neck: Normal range of motion. Neck supple.  Cardiovascular: Normal rate, regular rhythm, normal heart sounds and intact distal pulses.   Pulmonary/Chest: Effort normal and breath sounds normal. No respiratory distress. No wheezes. No rales.  Abdominal: Soft. Bowel sounds are normal. Exhibits no distension and no mass. There is no tenderness.  Musculoskeletal: Normal range of motion. Exhibits no edema.  Lymphadenopathy:    No cervical adenopathy.  Neurological: Alert and oriented to person, place, and time. Exhibits normal muscle tone. Gait normal. Coordination normal.  Skin: Skin is warm and dry. No rash noted. Not diaphoretic. No erythema. No pallor.  Psychiatric: Mood, memory and judgment normal.  Vitals reviewed.  LABORATORY DATA: Lab Results  Component Value Date   WBC 3.9 (L) 06/20/2018   HGB 14.4 06/20/2018   HCT 41.7 06/20/2018   MCV 94.7 06/20/2018   PLT 223 06/20/2018      Chemistry      Component Value Date/Time   NA 134 (L) 06/20/2018 0945   NA 132 (L) 12/06/2017 0930   K 4.2 06/20/2018 0945   K 4.0 12/06/2017 0930   CL 101 06/20/2018 0945   CO2 26 06/20/2018 0945   CO2 26 12/06/2017 0930   BUN 18 06/20/2018 0945   BUN 15.4 12/06/2017 0930   CREATININE 0.80 06/20/2018 0945   CREATININE 0.9 12/06/2017 0930      Component Value Date/Time   CALCIUM 9.9 06/20/2018 0945   CALCIUM 9.9 12/06/2017 0930   ALKPHOS 85 06/20/2018 0945   ALKPHOS 107 12/06/2017 0930   AST 23 06/20/2018 0945   AST 20 12/06/2017 0930   ALT 16 06/20/2018 0945   ALT 14 12/06/2017 0930   BILITOT 0.8 06/20/2018 0945   BILITOT 0.76 12/06/2017 0930       RADIOGRAPHIC STUDIES:  No results found.   ASSESSMENT/PLAN:  Malignant neoplasm of lower lobe of right lung This is a very pleasant 77 year old white male with metastatic  non-small cell lung cancer, adenocarcinoma and currently on treatment with second line immunotherapy with Nivolumab status post 60 cycles and has been tolerating his treatment well. He was recently switched to Nivolumab 480 MG IV every 4 weeks status post 12 cycles. The patient continues to tolerate his treatment well with no concerning complaints.  I recommended for him to proceed with cycle #13 today as a scheduled.  The patient will have a restaging CT scan of the chest, abdomen, pelvis prior to his next visit.  He will follow-up in 4  weeks for evaluation prior to cycle #14 and to review his restaging CT scan results.  For hypertension he was advised to take his blood pressure medication and consult with his primary care physician for adjustment if needed.  The patient was given refill for Vicodin for pain management.  He was advised to call immediately if he has any concerning symptoms in the interval. The patient voices understanding of current disease status and treatment options and is in agreement with the current care plan. All questions were answered. The patient knows to call the clinic with any problems, questions or concerns. We can certainly see the patient much sooner if necessary.   Orders Placed This Encounter  Procedures  . CT ABDOMEN PELVIS W CONTRAST    Standing Status:   Future    Standing Expiration Date:   06/21/2019    Order Specific Question:   If indicated for the ordered procedure, I authorize the administration of contrast media per Radiology protocol    Answer:   Yes    Order Specific Question:   Preferred imaging location?    Answer:   [facility]    Order Specific Question:   Radiology Contrast Protocol - do NOT remove file path    Answer:   \\[email or web address]    Order Specific Question:   ** REASON FOR EXAM (FREE TEXT)    Answer:   Lung cancer. restaging.  . CT CHEST W CONTRAST    Standing Status:   Future    Standing  Expiration Date:   06/21/2019    Order Specific Question:   If indicated for the ordered procedure, I authorize the administration of contrast media per Radiology protocol    Answer:   Yes    Order Specific Question:   Preferred imaging location?    Answer:   [facility]    Order Specific Question:   Radiology Contrast Protocol - do NOT remove file path    Answer:   \\[email or web address]    Order Specific Question:   ** REASON FOR EXAM (FREE TEXT)    Answer:   Lung cancer. Restaging.   [name], DNP, AGPCNP-BC, AOCNP 06/20/18

## 2018-06-20 NOTE — Patient Instructions (Signed)
[facility] Discharge Instructions for Patients Receiving Chemotherapy  Today you received the following chemotherapy agents :  Opdivo.  To help prevent nausea and vomiting after your treatment, we encourage you to take your nausea medication as prescribed.   If you develop nausea and vomiting that is not controlled by your nausea medication, call the clinic.   BELOW ARE SYMPTOMS THAT SHOULD BE REPORTED IMMEDIATELY:  *FEVER GREATER THAN 100.5 F  *CHILLS WITH OR WITHOUT FEVER  NAUSEA AND VOMITING THAT IS NOT CONTROLLED WITH YOUR NAUSEA MEDICATION  *UNUSUAL SHORTNESS OF BREATH  *UNUSUAL BRUISING OR BLEEDING  TENDERNESS IN MOUTH AND THROAT WITH OR WITHOUT PRESENCE OF ULCERS  *URINARY PROBLEMS  *BOWEL PROBLEMS  UNUSUAL RASH Items with * indicate a potential emergency and should be followed up as soon as possible.  Feel free to call the clinic should you have any questions or concerns. The clinic phone number is [phone].  Please show the CHEMO ALERT CARD at check-in to the Emergency Department and triage nurse.   

## 2018-06-20 NOTE — Assessment & Plan Note (Addendum)
This is a very pleasant 77 year old white male with metastatic non-small cell lung cancer, adenocarcinoma and currently on treatment with second line immunotherapy with Nivolumab status post 60 cycles and has been tolerating his treatment well. He was recently switched to Nivolumab 480 MG IV every 4 weeks status post 12 cycles. The patient continues to tolerate his treatment well with no concerning complaints.  I recommended for him to proceed with cycle #13 today as a scheduled.  The patient will have a restaging CT scan of the chest, abdomen, pelvis prior to his next visit.  He will follow-up in 4 weeks for evaluation prior to cycle #14 and to review his restaging CT scan results.  For hypertension he was advised to take his blood pressure medication and consult with his primary care physician for adjustment if needed.  The patient was given refill for Vicodin for pain management.  He was advised to call immediately if he has any concerning symptoms in the interval. The patient voices understanding of current disease status and treatment options and is in agreement with the current care plan. All questions were answered. The patient knows to call the clinic with any problems, questions or concerns. We can certainly see the patient much sooner if necessary.

## 2018-06-27 DIAGNOSIS — H353221 Exudative age-related macular degeneration, left eye, with active choroidal neovascularization: Secondary | ICD-10-CM | POA: Diagnosis not present

## 2018-06-28 ENCOUNTER — Other Ambulatory Visit: Payer: Self-pay | Admitting: Internal Medicine

## 2018-06-28 DIAGNOSIS — C3490 Malignant neoplasm of unspecified part of unspecified bronchus or lung: Secondary | ICD-10-CM

## 2018-06-28 DIAGNOSIS — G893 Neoplasm related pain (acute) (chronic): Secondary | ICD-10-CM

## 2018-07-06 DIAGNOSIS — H26493 Other secondary cataract, bilateral: Secondary | ICD-10-CM | POA: Diagnosis not present

## 2018-07-11 ENCOUNTER — Telehealth: Payer: Self-pay | Admitting: *Deleted

## 2018-07-11 NOTE — Telephone Encounter (Signed)
Oncology Nurse Navigator Documentation  Oncology Nurse Navigator Flowsheets 07/11/2018  Navigator Location [facility]  Navigator Encounter Type Telephone/I followed up on Anthony Curry schedule. He needs to have CT scan before he sees Dr. [name] next week.  I called him but was unable to leave a message. I did call his wife and was able to leave her a vm message with the number to call to schedule CT scan.   Telephone Outgoing Call  Treatment Phase Treatment  Barriers/Navigation Needs Education;Coordination of Care  Education Other  Interventions Coordination of Care;Education  Coordination of Care Other  Education Method Verbal  Acuity Level 1  Time Spent with Patient 15

## 2018-07-13 ENCOUNTER — Encounter (HOSPITAL_COMMUNITY): Payer: Self-pay

## 2018-07-13 ENCOUNTER — Ambulatory Visit (HOSPITAL_COMMUNITY)
Admission: RE | Admit: 2018-07-13 | Discharge: 2018-07-13 | Disposition: A | Discharge status: Home / Self Care | Payer: Medicare Other | Source: Ambulatory Visit | Attending: Oncology | Admitting: Oncology

## 2018-07-13 DIAGNOSIS — R918 Other nonspecific abnormal finding of lung field: Secondary | ICD-10-CM | POA: Diagnosis not present

## 2018-07-13 DIAGNOSIS — J439 Emphysema, unspecified: Secondary | ICD-10-CM | POA: Insufficient documentation

## 2018-07-13 DIAGNOSIS — C3491 Malignant neoplasm of unspecified part of right bronchus or lung: Secondary | ICD-10-CM | POA: Diagnosis not present

## 2018-07-13 DIAGNOSIS — K5790 Diverticulosis of intestine, part unspecified, without perforation or abscess without bleeding: Secondary | ICD-10-CM | POA: Insufficient documentation

## 2018-07-13 DIAGNOSIS — N3289 Other specified disorders of bladder: Secondary | ICD-10-CM | POA: Diagnosis not present

## 2018-07-13 DIAGNOSIS — I700 Atherosclerosis of aorta: Secondary | ICD-10-CM | POA: Insufficient documentation

## 2018-07-13 DIAGNOSIS — C3431 Malignant neoplasm of lower lobe, right bronchus or lung: Secondary | ICD-10-CM | POA: Insufficient documentation

## 2018-07-13 DIAGNOSIS — N323 Diverticulum of bladder: Secondary | ICD-10-CM | POA: Diagnosis not present

## 2018-07-13 DIAGNOSIS — Z5112 Encounter for antineoplastic immunotherapy: Secondary | ICD-10-CM | POA: Diagnosis not present

## 2018-07-13 DIAGNOSIS — M479 Spondylosis, unspecified: Secondary | ICD-10-CM | POA: Diagnosis not present

## 2018-07-13 MED ORDER — IOHEXOL 300 MG/ML  SOLN
100.0000 mL | Freq: Once | INTRAMUSCULAR | Status: AC | PRN
Start: 2018-07-13 — End: 2018-07-13
  Administered 2018-07-13: 100 mL via INTRAVENOUS

## 2018-07-18 ENCOUNTER — Inpatient Hospital Stay: Payer: Medicare Other

## 2018-07-18 ENCOUNTER — Inpatient Hospital Stay: Payer: Medicare Other | Attending: Internal Medicine | Admitting: Internal Medicine

## 2018-07-18 ENCOUNTER — Telehealth: Payer: Self-pay | Admitting: Internal Medicine

## 2018-07-18 ENCOUNTER — Encounter: Payer: Self-pay | Admitting: Internal Medicine

## 2018-07-18 VITALS — BP 130/76 | HR 69 | Temp 98.1°F | Resp 18 | Ht 71.0 in | Wt 143.8 lb

## 2018-07-18 DIAGNOSIS — R530 Neoplastic (malignant) related fatigue: Secondary | ICD-10-CM

## 2018-07-18 DIAGNOSIS — F419 Anxiety disorder, unspecified: Secondary | ICD-10-CM

## 2018-07-18 DIAGNOSIS — C3431 Malignant neoplasm of lower lobe, right bronchus or lung: Secondary | ICD-10-CM

## 2018-07-18 DIAGNOSIS — R52 Pain, unspecified: Secondary | ICD-10-CM

## 2018-07-18 DIAGNOSIS — G893 Neoplasm related pain (acute) (chronic): Secondary | ICD-10-CM

## 2018-07-18 DIAGNOSIS — Z79899 Other long term (current) drug therapy: Secondary | ICD-10-CM | POA: Diagnosis not present

## 2018-07-18 DIAGNOSIS — Z5112 Encounter for antineoplastic immunotherapy: Secondary | ICD-10-CM | POA: Diagnosis not present

## 2018-07-18 DIAGNOSIS — C7972 Secondary malignant neoplasm of left adrenal gland: Secondary | ICD-10-CM | POA: Diagnosis not present

## 2018-07-18 DIAGNOSIS — I119 Hypertensive heart disease without heart failure: Secondary | ICD-10-CM

## 2018-07-18 DIAGNOSIS — C3490 Malignant neoplasm of unspecified part of unspecified bronchus or lung: Secondary | ICD-10-CM

## 2018-07-18 LAB — CBC WITH DIFFERENTIAL (CANCER CENTER ONLY)
Basophils Absolute: 0 10*3/uL (ref 0.0–0.1)
Basophils Relative: 1 %
EOS ABS: 0.3 10*3/uL (ref 0.0–0.5)
EOS PCT: 7 %
HCT: 40 % (ref 38.4–49.9)
Hemoglobin: 13.9 g/dL (ref 13.0–17.1)
LYMPHS ABS: 1.2 10*3/uL (ref 0.9–3.3)
Lymphocytes Relative: 28 %
MCH: 32.9 pg (ref 27.2–33.4)
MCHC: 34.8 g/dL (ref 32.0–36.0)
MCV: 94.6 fL (ref 79.3–98.0)
MONO ABS: 0.7 10*3/uL (ref 0.1–0.9)
MONOS PCT: 17 %
Neutro Abs: 2 10*3/uL (ref 1.5–6.5)
Neutrophils Relative %: 47 %
PLATELETS: 204 10*3/uL (ref 140–400)
RBC: 4.23 MIL/uL (ref 4.20–5.82)
RDW: 14.9 % — AB (ref 11.0–14.6)
WBC: 4.1 10*3/uL (ref 4.0–10.3)

## 2018-07-18 LAB — CMP (CANCER CENTER ONLY)
ALBUMIN: 3.9 g/dL (ref 3.5–5.0)
ALT: 14 U/L (ref 0–44)
AST: 19 U/L (ref 15–41)
Alkaline Phosphatase: 82 U/L (ref 38–126)
Anion gap: 11 (ref 5–15)
BUN: 16 mg/dL (ref 8–23)
CHLORIDE: 103 mmol/L (ref 98–111)
CO2: 23 mmol/L (ref 22–32)
CREATININE: 0.8 mg/dL (ref 0.61–1.24)
Calcium: 9.1 mg/dL (ref 8.9–10.3)
GFR, Est AFR Am: >60 mL/min (ref >60)
GLUCOSE: 91 mg/dL (ref 70–99)
Potassium: 3.9 mmol/L (ref 3.5–5.1)
Sodium: 137 mmol/L (ref 135–145)
Total Bilirubin: 0.8 mg/dL (ref 0.3–1.2)
Total Protein: 6.1 g/dL — ABNORMAL LOW (ref 6.5–8.1)

## 2018-07-18 LAB — TSH: TSH: 8.755 u[IU]/mL — AB (ref 0.320–4.118)

## 2018-07-18 MED ORDER — HYDROCODONE-ACETAMINOPHEN 5-325 MG PO TABS: 1.0000 | ORAL_TABLET | Freq: Four times a day (QID) | ORAL | 0 refills | Status: DC | PRN

## 2018-07-18 MED ORDER — SODIUM CHLORIDE 0.9 % IV SOLN
Freq: Once | INTRAVENOUS | Status: AC
  Administered 2018-07-18: 10:00:00 via INTRAVENOUS
  Filled 2018-07-18: qty 250

## 2018-07-18 MED ORDER — SODIUM CHLORIDE 0.9 % IV SOLN
480.0000 mg | Freq: Once | INTRAVENOUS | Status: AC
  Administered 2018-07-18: 480 mg via INTRAVENOUS
  Filled 2018-07-18: qty 48

## 2018-07-18 NOTE — Patient Instructions (Signed)
[facility] Discharge Instructions for Patients Receiving Chemotherapy  Today you received the following chemotherapy agents Nilvoulamb.  To help prevent nausea and vomiting after your treatment, we encourage you to take your nausea medication as directed.  If you develop nausea and vomiting that is not controlled by your nausea medication, call the clinic.   BELOW ARE SYMPTOMS THAT SHOULD BE REPORTED IMMEDIATELY:  *FEVER GREATER THAN 100.5 F  *CHILLS WITH OR WITHOUT FEVER  NAUSEA AND VOMITING THAT IS NOT CONTROLLED WITH YOUR NAUSEA MEDICATION  *UNUSUAL SHORTNESS OF BREATH  *UNUSUAL BRUISING OR BLEEDING  TENDERNESS IN MOUTH AND THROAT WITH OR WITHOUT PRESENCE OF ULCERS  *URINARY PROBLEMS  *BOWEL PROBLEMS  UNUSUAL RASH Items with * indicate a potential emergency and should be followed up as soon as possible.  Feel free to call the clinic should you have any questions or concerns. The clinic phone number is [phone].  Please show the [place] at check-in to the Emergency Department and triage nurse.

## 2018-07-18 NOTE — Progress Notes (Signed)
[facility] [phone]) [phone]   [phone]  OFFICE PROGRESS NOTE  [name], MD [place] [id number]  DIAGNOSIS: Stage IV (T1a, N0, M1b) non-small cell lung cancer, adenocarcinoma of the right lower lobe diagnosed in October 2014  Molecular profile: Negative forRET, ALK, BRAF, MET, EGFR.  Positive for ERBB2 A775T, [id number], KRAS G13E, MAP2K1 D67N (see full report).   PRIOR THERAPY:  1) Systemic chemotherapy with carboplatin for AUC of 5 and Alimta 500 mg/M2 every 3 weeks, status post 6 cycles with stable disease. First dose on 10/23/2013. 2) Maintenance systemic chemotherapy with single agent Alimta 500 mg/M2 every 3 weeks. Status post 8 cycles.  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 Sondra Come completed on 12/16/2014. 4) stereotactic radiotherapy to the enlarging right upper lobe lung nodule under the care of Dr. Sondra Come completed on 11/22/2016. 5) Immunotherapy with Nivolumab 240 mg IV every 2 weeks, status post 60 cycles. First cycle was given on 01/28/2015. Last dose 07/05/2017.  CURRENT THERAPY: Immunotherapy with Nivolumab 480 mg IV every 4 weeks. First dose 07/19/2017. Status post 13 cycles.  INTERVAL HISTORY: Anthony Curry. 77 y.o. male returns to the clinic today for follow-up visit accompanied by his wife.  The patient is feeling fine today with no concerning complaints except for anxiety about his a scan results.  He denied having any chest pain, shortness of breath, cough or hemoptysis.  He denied having any fever or chills.  He has no nausea, vomiting, diarrhea or constipation.  He continues to tolerate his treatment with immunotherapy fairly well.  The patient had repeat CT scan of the chest, abdomen and pelvis performed recently and he is here for evaluation and discussion of his discuss results.  MEDICAL HISTORY: Past Medical History:  Diagnosis Date  .  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[place])   . Adrenal mass ([place])   . Alcoholism in remission ([employer])   . Anemia of chronic disease   . 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 Anxiety   . BPH (benign prostatic hyperplasia)   . Cancer ([place])    small cell/lung  . Complication of anesthesia 09/08/2015    JITTERY AFTER GALLBLADDER SURGERY  . GERD (gastroesophageal reflux disease)   . History of radiation therapy 11/14/16, 11/18/16, 11/22/16   Left upper lung: 54 Gy in 3 fractions  . Hypertension   . Hypertension 12/07/2016  . Neoplastic malignant related fatigue    IV tx. every 2 weeks last9-14-16 (Dr. Earlie Server), radiation last tx. 6 months  . 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 Persistent atrial fibrillation ([place])   . Pulmonary nodules   . Radiation 12/02/14-12/16/14   Left adrenal gland 30 Gy in 10 fractions    ALLERGIES:  has No Known Allergies.  MEDICATIONS:  Current Outpatient Medications  Medication Sig Dispense Refill  . ALPRAZolam (XANAX) 0.25 MG tablet TAKE 1 TABLET BY MOUTH TWICE A DAY AS NEEDED FOR ANXIETY AND AS NEEDED FOR SLEEP 60 tablet 0  . apixaban (ELIQUIS) 5 MG TABS tablet Take 5 mg by mouth 2 (two) times daily.    . Ascorbic Acid (VITAMIN C PO) Take 1,000 mg by mouth daily.     . B Complex-C (SUPER B COMPLEX PO) Take 1 each by mouth daily.     . calcium carbonate (OS-CAL) 600 MG TABS Take 600 mg by mouth daily with breakfast.    . demeclocycline (DECLOMYCIN) 150  MG tablet Take 150 mg by mouth 2 (two) times daily.    [name] diltiazem (CARDIZEM CD) 180 MG 24 hr capsule Take 1 capsule (180 mg total) by mouth daily at 12 noon. 90 capsule 3  . furosemide (LASIX) 40 MG tablet Take 0.5 tablets (20 mg total) by mouth every other day.    [name] HYDROcodone-acetaminophen (NORCO/VICODIN) 5-325 MG tablet Take 1 tablet by mouth every 6 (six) hours as needed for moderate pain. 40 tablet 0  . KLOR-CON M20 20 MEQ tablet Take 20 mEq by mouth every morning.     . Lactobacillus (ACIDOPHILUS)  CAPS capsule Take 1 capsule by mouth daily.    [name] levothyroxine (SYNTHROID, LEVOTHROID) 50 MCG tablet TAKE 1 TABLET (50 MCG TOTAL) BY MOUTH DAILY BEFORE BREAKFAST. 90 tablet 0  . MAGNESIUM PO Take 400 mg by mouth daily.     . Multiple Vitamin (MULTIVITAMIN WITH MINERALS) TABS Take 1 tablet by mouth daily.    . Nivolumab (OPDIVO IV) Inject as directed every 2 weeks    . Nutritional Supplements (ENSURE COMPLETE PO) Take 1 Can by mouth 2 (two) times daily.    . Omega-3 Fatty Acids (FISH OIL PO) Take 1 tablet by mouth daily.    . pantoprazole (PROTONIX) 40 MG tablet Take 40 mg by mouth every morning.  8  . tamsulosin (FLOMAX) 0.4 MG CAPS capsule Take 0.4 mg by mouth daily at 12 noon.     . thiamine (VITAMIN B-1) 100 MG tablet Take 1 tablet (100 mg total) by mouth daily. 30 tablet 6  . vitamin E 400 UNIT capsule Take 400 Units by mouth daily.     No current facility-administered medications for this visit.     SURGICAL HISTORY:  Past Surgical History:  Procedure Laterality Date  . ANKLE ARTHRODESIS  1995   rt fx-hardware in  . CARDIOVERSION N/A 09/29/2014   Procedure: CARDIOVERSION;  Surgeon: Josue Hector, MD;  Location: [facility] ENDOSCOPY;  Service: Cardiovascular;  Laterality: N/A;  . CHOLECYSTECTOMY  09/08/2015    laproscopic   . CHOLECYSTECTOMY N/A 09/08/2015   Procedure: LAPAROSCOPIC CHOLECYSTECTOMY WITH ATTEMPTED INTRAOPERATIVE CHOLANGIOGRAM ;  Surgeon: Fanny Skates, MD;  Location: [facility];  Service: General;  Laterality: N/A;  . COLONOSCOPY    . ERCP N/A 09/02/2015   Procedure: ENDOSCOPIC RETROGRADE CHOLANGIOPANCREATOGRAPHY (ERCP);  Surgeon: Arta Silence, MD;  Location: [facility] ENDOSCOPY;  Service: Endoscopy;  Laterality: N/A;  . ERCP N/A 09/07/2015   Procedure: ENDOSCOPIC RETROGRADE CHOLANGIOPANCREATOGRAPHY (ERCP);  Surgeon: Clarene Essex, MD;  Location: [facility] ENDOSCOPY;  Service: Endoscopy;  Laterality: N/A;  . EXCISION / CURETTAGE BONE CYST FINGER  2005   rt thumb  . HEMORRHOID SURGERY    .  Percutaneous Cholecytostomy tube     IVR insertion 06-13-15(Dr. Vernard Gambles)- 08-28-15 remains in place to drainage bag  . TONSILLECTOMY    . TOTAL SHOULDER ARTHROPLASTY  02/03/2012   Procedure: TOTAL SHOULDER ARTHROPLASTY;  Surgeon: [name], MD;  Location: [facility];  Service: Orthopedics;  Laterality: Left;    REVIEW OF SYSTEMS:  Constitutional: positive for fatigue Eyes: negative Ears, nose, mouth, throat, and face: negative Respiratory: negative Cardiovascular: negative Gastrointestinal: negative Genitourinary:negative Integument/breast: negative Hematologic/lymphatic: negative Musculoskeletal:negative Neurological: negative Behavioral/Psych: negative Endocrine: negative Allergic/Immunologic: negative   PHYSICAL EXAMINATION: General appearance: alert, cooperative, fatigued and no distress Head: Normocephalic, without obvious abnormality, atraumatic Neck: no adenopathy, no JVD, supple, symmetrical, trachea midline and thyroid not enlarged, symmetric, no tenderness/mass/nodules Lymph nodes: Cervical, supraclavicular, and axillary nodes normal.  Resp: clear to auscultation bilaterally Back: symmetric, no curvature. ROM normal. No CVA tenderness. Cardio: regular rate and rhythm, S1, S2 normal, no murmur, click, rub or gallop GI: soft, non-tender; bowel sounds normal; no masses,  no organomegaly Extremities: extremities normal, atraumatic, no cyanosis or edema Neurologic: Alert and oriented X 3, normal strength and tone. Normal symmetric reflexes. Normal coordination and gait  ECOG PERFORMANCE STATUS: 1 - Symptomatic but completely ambulatory  Blood pressure 130/76, pulse 69, temperature 98.1 F (36.7 C), temperature source Oral, resp. rate 18, height _0  (1.803 m), weight 143 lb 12.8 oz (65.2 kg), SpO2 98 %.  LABORATORY DATA: Lab Results  Component Value Date   WBC 4.1 07/18/2018   HGB 13.9 07/18/2018   HCT 40.0 07/18/2018   MCV 94.6 07/18/2018   PLT 204  07/18/2018      Chemistry      Component Value Date/Time   NA 134 (L) 06/20/2018 0945   NA 132 (L) 12/06/2017 0930   K 4.2 06/20/2018 0945   K 4.0 12/06/2017 0930   CL 101 06/20/2018 0945   CO2 26 06/20/2018 0945   CO2 26 12/06/2017 0930   BUN 18 06/20/2018 0945   BUN 15.4 12/06/2017 0930   CREATININE 0.80 06/20/2018 0945   CREATININE 0.9 12/06/2017 0930      Component Value Date/Time   CALCIUM 9.9 06/20/2018 0945   CALCIUM 9.9 12/06/2017 0930   ALKPHOS 85 06/20/2018 0945   ALKPHOS 107 12/06/2017 0930   AST 23 06/20/2018 0945   AST 20 12/06/2017 0930   ALT 16 06/20/2018 0945   ALT 14 12/06/2017 0930   BILITOT 0.8 06/20/2018 0945   BILITOT 0.76 12/06/2017 0930       RADIOGRAPHIC STUDIES: Ct Chest W Contrast  Result Date: 07/13/2018 CLINICAL DATA:  Right lung cancer diagnosed in 2014. Chemotherapy and radiation therapy completed. Immunotherapy in progress. EXAM: CT CHEST, ABDOMEN, AND PELVIS WITH CONTRAST TECHNIQUE: Multidetector CT imaging of the chest, abdomen and pelvis was performed following the standard protocol during bolus administration of intravenous contrast. CONTRAST:  158m OMNIPAQUE IOHEXOL 300 MG/ML  SOLN COMPARISON:  CT 04/23/2018 and 01/29/2018. FINDINGS: CT CHEST FINDINGS Cardiovascular: There is atherosclerosis of the aorta, great vessels and coronary arteries. There is ostial stenosis at the origin of the left subclavian artery. The heart size is normal. A small pericardial effusion appears unchanged. Mediastinum/Nodes: There are no enlarged mediastinal, hilar or axillary lymph nodes. The thyroid gland, trachea and esophagus demonstrate no significant findings. Lungs/Pleura: There is no pleural effusion. There is moderate centrilobular and paraseptal emphysema with diffuse airway thickening and mild bronchiectasis. There has been further clearing of the probable inflammatory changes in the right middle lobe and lingula. The left lower lobe process seen on the most  recent study has resolved. The left apical radiation changes in the multifocal ground-glass/part solid nodules bilaterally are unchanged. Largest is located posteriorly in the right upper lobe, measuring 2.9 x 2.0 cm on image 53/7. No enlarging or increasingly solid nodules identified. Musculoskeletal/Chest wall: Stable asymmetric gynecomastia on the right. No other chest wall mass. Old rib fractures bilaterally and diffuse degenerative changes throughout the thoracic spine. CT ABDOMEN AND PELVIS FINDINGS Hepatobiliary: The liver appears stable with a hemangioma anteriorly in the dome of the right lobe (image 55/2) and cysts inferiorly. No suspicious hepatic findings. No significant biliary dilatation status post cholecystectomy. Pancreas: Unremarkable. No pancreatic ductal dilatation or surrounding inflammatory changes. Spleen: Normal in size without focal abnormality. Adrenals/Urinary Tract: Stable  1.8 cm right adrenal nodule. The left adrenal gland appears normal. There is bilateral renal cortical scarring. No evidence of renal mass, hydronephrosis or urinary tract calculus. The bladder is trabeculated with multiple diverticula. There is new dependent high-density within a posterior diverticulum (image 106/2) likely debris or clot. Stomach/Bowel: No evidence of bowel wall thickening, distention or surrounding inflammatory change. Colonic interposition and sigmoid colon diverticular changes are noted. Vascular/Lymphatic: There are no enlarged abdominal or pelvic lymph nodes. Diffuse aortic and branch vessel atherosclerosis. Reproductive: The prostate gland and seminal vesicles appear normal. Other: No evidence of abdominal wall mass or hernia. No ascites. Musculoskeletal: No acute or significant osseous findings. Extensive degenerative changes throughout the spine. IMPRESSION: 1. No evidence of lung cancer recurrence/progression or metastatic disease. 2. Further clearing of probable bilateral pulmonary  inflammatory changes. 3. Extensive chronic lung disease with emphysema and scarring. Multiple ground-glass and part solid pulmonary nodules bilaterally are stable and require continued annual CT follow-up until stability has been established over 5 years. This recommendation follows the consensus statement: Guidelines for Management of Small Pulmonary Nodules Detected on CT Images: From the Fleischner Society 2017; Radiology 2017; 284:228-243. 4. Trabeculated bladder with new dependent density in one of the diverticula, likely debris/clot. Correlation with urine analysis recommended. 5. Stable additional incidental findings including diverticulosis, diffuse spondylosis and Aortic Atherosclerosis (ICD10-I70.0). Electronically Signed   By: Richardean Sale M.D.   On: 07/13/2018 14:40   Ct Abdomen Pelvis W Contrast  Result Date: 07/13/2018 CLINICAL DATA:  Right lung cancer diagnosed in 2014. Chemotherapy and radiation therapy completed. Immunotherapy in progress. EXAM: CT CHEST, ABDOMEN, AND PELVIS WITH CONTRAST TECHNIQUE: Multidetector CT imaging of the chest, abdomen and pelvis was performed following the standard protocol during bolus administration of intravenous contrast. CONTRAST:  151m OMNIPAQUE IOHEXOL 300 MG/ML  SOLN COMPARISON:  CT 04/23/2018 and 01/29/2018. FINDINGS: CT CHEST FINDINGS Cardiovascular: There is atherosclerosis of the aorta, great vessels and coronary arteries. There is ostial stenosis at the origin of the left subclavian artery. The heart size is normal. A small pericardial effusion appears unchanged. Mediastinum/Nodes: There are no enlarged mediastinal, hilar or axillary lymph nodes. The thyroid gland, trachea and esophagus demonstrate no significant findings. Lungs/Pleura: There is no pleural effusion. There is moderate centrilobular and paraseptal emphysema with diffuse airway thickening and mild bronchiectasis. There has been further clearing of the probable inflammatory changes in the  right middle lobe and lingula. The left lower lobe process seen on the most recent study has resolved. The left apical radiation changes in the multifocal ground-glass/part solid nodules bilaterally are unchanged. Largest is located posteriorly in the right upper lobe, measuring 2.9 x 2.0 cm on image 53/7. No enlarging or increasingly solid nodules identified. Musculoskeletal/Chest wall: Stable asymmetric gynecomastia on the right. No other chest wall mass. Old rib fractures bilaterally and diffuse degenerative changes throughout the thoracic spine. CT ABDOMEN AND PELVIS FINDINGS Hepatobiliary: The liver appears stable with a hemangioma anteriorly in the dome of the right lobe (image 55/2) and cysts inferiorly. No suspicious hepatic findings. No significant biliary dilatation status post cholecystectomy. Pancreas: Unremarkable. No pancreatic ductal dilatation or surrounding inflammatory changes. Spleen: Normal in size without focal abnormality. Adrenals/Urinary Tract: Stable 1.8 cm right adrenal nodule. The left adrenal gland appears normal. There is bilateral renal cortical scarring. No evidence of renal mass, hydronephrosis or urinary tract calculus. The bladder is trabeculated with multiple diverticula. There is new dependent high-density within a posterior diverticulum (image 106/2) likely debris or clot. Stomach/Bowel: No evidence  of bowel wall thickening, distention or surrounding inflammatory change. Colonic interposition and sigmoid colon diverticular changes are noted. Vascular/Lymphatic: There are no enlarged abdominal or pelvic lymph nodes. Diffuse aortic and branch vessel atherosclerosis. Reproductive: The prostate gland and seminal vesicles appear normal. Other: No evidence of abdominal wall mass or hernia. No ascites. Musculoskeletal: No acute or significant osseous findings. Extensive degenerative changes throughout the spine. IMPRESSION: 1. No evidence of lung cancer recurrence/progression or  metastatic disease. 2. Further clearing of probable bilateral pulmonary inflammatory changes. 3. Extensive chronic lung disease with emphysema and scarring. Multiple ground-glass and part solid pulmonary nodules bilaterally are stable and require continued annual CT follow-up until stability has been established over 5 years. This recommendation follows the consensus statement: Guidelines for Management of Small Pulmonary Nodules Detected on CT Images: From the Fleischner Society 2017; Radiology 2017; 284:228-243. 4. Trabeculated bladder with new dependent density in one of the diverticula, likely debris/clot. Correlation with urine analysis recommended. 5. Stable additional incidental findings including diverticulosis, diffuse spondylosis and Aortic Atherosclerosis (ICD10-I70.0). Electronically Signed   By: Richardean Sale M.D.   On: 07/13/2018 14:40    ASSESSMENT AND PLAN:  This is a very pleasant 77 years old white male with metastatic non-small cell lung cancer, adenocarcinoma and currently on treatment with second line immunotherapy with Nivolumab status post 60 cycles and has been tolerating his treatment well. He was recently switched to Nivolumab 480 MG IV every 4 weeks status post 13 cycles. The patient continues to tolerate this treatment well with no concerning complaints. Repeat CT scan of the chest, abdomen and pelvis performed recently showed no concerning findings for disease progression. I personally and independently reviewed the scans and discussed the results with the patient and his wife today. I recommended for him to continue with his current treatment with immunotherapy and he will proceed with cycle #14 today. For pain management, I gave him a refill of hydrocodone. The patient will come back for follow-up visit in 4 weeks for evaluation before the next cycle of his treatment. He was advised to call immediately if he has any concerning symptoms in the interval. The patient voices  understanding of current disease status and treatment options and is in agreement with the current care plan. All questions were answered. The patient knows to call the clinic with any problems, questions or concerns. We can certainly see the patient much sooner if necessary.  Disclaimer: This note was dictated with voice recognition software. Similar sounding words can inadvertently be transcribed and may not be corrected upon review.

## 2018-07-18 NOTE — Telephone Encounter (Signed)
Scheduled appt per 8/14 los - pt to get an updated schedule in treatment area.

## 2018-07-30 DIAGNOSIS — Z7901 Long term (current) use of anticoagulants: Secondary | ICD-10-CM | POA: Diagnosis not present

## 2018-07-30 DIAGNOSIS — R42 Dizziness and giddiness: Secondary | ICD-10-CM | POA: Diagnosis not present

## 2018-07-30 DIAGNOSIS — R040 Epistaxis: Secondary | ICD-10-CM | POA: Diagnosis not present

## 2018-08-02 ENCOUNTER — Other Ambulatory Visit: Payer: Self-pay | Admitting: Internal Medicine

## 2018-08-02 DIAGNOSIS — G893 Neoplasm related pain (acute) (chronic): Secondary | ICD-10-CM

## 2018-08-02 DIAGNOSIS — C3490 Malignant neoplasm of unspecified part of unspecified bronchus or lung: Secondary | ICD-10-CM

## 2018-08-08 DIAGNOSIS — H353221 Exudative age-related macular degeneration, left eye, with active choroidal neovascularization: Secondary | ICD-10-CM | POA: Diagnosis not present

## 2018-08-15 ENCOUNTER — Inpatient Hospital Stay (HOSPITAL_BASED_OUTPATIENT_CLINIC_OR_DEPARTMENT_OTHER): Payer: Medicare Other | Admitting: Internal Medicine

## 2018-08-15 ENCOUNTER — Inpatient Hospital Stay: Payer: Medicare Other

## 2018-08-15 ENCOUNTER — Telehealth: Payer: Self-pay | Admitting: Internal Medicine

## 2018-08-15 ENCOUNTER — Inpatient Hospital Stay: Payer: Medicare Other | Attending: Internal Medicine

## 2018-08-15 ENCOUNTER — Encounter: Payer: Self-pay | Admitting: Internal Medicine

## 2018-08-15 VITALS — BP 148/67 | HR 64 | Temp 98.6°F | Resp 18 | Ht 71.0 in | Wt 143.0 lb

## 2018-08-15 DIAGNOSIS — Z5112 Encounter for antineoplastic immunotherapy: Secondary | ICD-10-CM | POA: Diagnosis not present

## 2018-08-15 DIAGNOSIS — G893 Neoplasm related pain (acute) (chronic): Secondary | ICD-10-CM

## 2018-08-15 DIAGNOSIS — R5383 Other fatigue: Secondary | ICD-10-CM | POA: Diagnosis not present

## 2018-08-15 DIAGNOSIS — C3431 Malignant neoplasm of lower lobe, right bronchus or lung: Secondary | ICD-10-CM

## 2018-08-15 DIAGNOSIS — R530 Neoplastic (malignant) related fatigue: Secondary | ICD-10-CM

## 2018-08-15 DIAGNOSIS — Z79899 Other long term (current) drug therapy: Secondary | ICD-10-CM | POA: Diagnosis not present

## 2018-08-15 DIAGNOSIS — I10 Essential (primary) hypertension: Secondary | ICD-10-CM

## 2018-08-15 DIAGNOSIS — R42 Dizziness and giddiness: Secondary | ICD-10-CM | POA: Diagnosis not present

## 2018-08-15 DIAGNOSIS — G620 Drug-induced polyneuropathy: Secondary | ICD-10-CM

## 2018-08-15 DIAGNOSIS — C3490 Malignant neoplasm of unspecified part of unspecified bronchus or lung: Secondary | ICD-10-CM

## 2018-08-15 LAB — CBC WITH DIFFERENTIAL (CANCER CENTER ONLY)
BASOS ABS: 0 10*3/uL (ref 0.0–0.1)
BASOS PCT: 1 %
Eosinophils Absolute: 0.3 10*3/uL (ref 0.0–0.5)
Eosinophils Relative: 7 %
HEMATOCRIT: 40.4 % (ref 38.4–49.9)
HEMOGLOBIN: 13.8 g/dL (ref 13.0–17.1)
LYMPHS PCT: 29 %
Lymphs Abs: 1.1 10*3/uL (ref 0.9–3.3)
MCH: 33.3 pg (ref 27.2–33.4)
MCHC: 34.2 g/dL (ref 32.0–36.0)
MCV: 97.5 fL (ref 79.3–98.0)
Monocytes Absolute: 0.5 10*3/uL (ref 0.1–0.9)
Monocytes Relative: 13 %
NEUTROS PCT: 50 %
Neutro Abs: 2 10*3/uL (ref 1.5–6.5)
Platelet Count: 219 10*3/uL (ref 140–400)
RBC: 4.14 MIL/uL — AB (ref 4.20–5.82)
RDW: 14.3 % (ref 11.0–14.6)
WBC: 4 10*3/uL (ref 4.0–10.3)

## 2018-08-15 LAB — CMP (CANCER CENTER ONLY)
ALBUMIN: 3.8 g/dL (ref 3.5–5.0)
ALK PHOS: 96 U/L (ref 38–126)
ALT: 10 U/L (ref 0–44)
AST: 18 U/L (ref 15–41)
Anion gap: 9 (ref 5–15)
BILIRUBIN TOTAL: 0.5 mg/dL (ref 0.3–1.2)
BUN: 17 mg/dL (ref 8–23)
CO2: 23 mmol/L (ref 22–32)
Calcium: 9.4 mg/dL (ref 8.9–10.3)
Chloride: 100 mmol/L (ref 98–111)
Creatinine: 0.89 mg/dL (ref 0.61–1.24)
GFR, Est AFR Am: >60 mL/min (ref >60)
GFR, Estimated: >60 mL/min (ref >60)
GLUCOSE: 85 mg/dL (ref 70–99)
POTASSIUM: 4.9 mmol/L (ref 3.5–5.1)
Sodium: 132 mmol/L — ABNORMAL LOW (ref 135–145)
Total Protein: 6.1 g/dL — ABNORMAL LOW (ref 6.5–8.1)

## 2018-08-15 LAB — TSH: TSH: 5.635 u[IU]/mL — ABNORMAL HIGH (ref 0.320–4.118)

## 2018-08-15 MED ORDER — SODIUM CHLORIDE 0.9 % IV SOLN
Freq: Once | INTRAVENOUS | Status: AC
  Administered 2018-08-15: 10:00:00 via INTRAVENOUS
  Filled 2018-08-15: qty 250

## 2018-08-15 MED ORDER — HYDROCODONE-ACETAMINOPHEN 5-325 MG PO TABS: 1.0000 | ORAL_TABLET | Freq: Four times a day (QID) | ORAL | 0 refills | Status: DC | PRN

## 2018-08-15 MED ORDER — SODIUM CHLORIDE 0.9 % IV SOLN
480.0000 mg | Freq: Once | INTRAVENOUS | Status: AC
  Administered 2018-08-15: 480 mg via INTRAVENOUS
  Filled 2018-08-15: qty 48

## 2018-08-15 NOTE — Telephone Encounter (Signed)
Appts already scheduled per 9/11 los.

## 2018-08-15 NOTE — Progress Notes (Signed)
    [facility] [phone]   [phone]  OFFICE PROGRESS NOTE  [name], [name], MD [address] [place] [address]  DIAGNOSIS: Stage IV (T1a, N0, M1b) non-small cell lung cancer, adenocarcinoma of the right lower lobe diagnosed in October 2014  Molecular profile: Negative forRET, ALK, BRAF, MET, EGFR.  Positive for ERBB2 A775T, DDR2R473H, KRAS G13E, MAP2K1 D67N (see full report).   PRIOR THERAPY:  1) Systemic chemotherapy with carboplatin for AUC of 5 and Alimta 500 mg/M2 every 3 weeks, status post 6 cycles with stable disease. First dose on 10/23/2013. 2) Maintenance systemic chemotherapy with single agent Alimta 500 mg/M2 every 3 weeks. Status post 8 cycles.  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 Kinard completed on 12/16/2014. 4) stereotactic radiotherapy to the enlarging right upper lobe lung nodule under the care of Dr. Kinard completed on 11/22/2016. 5) Immunotherapy with Nivolumab 240 mg IV every 2 weeks, status post 60 cycles. First cycle was given on 01/28/2015. Last dose 07/05/2017.  CURRENT THERAPY: Immunotherapy with Nivolumab 480 mg IV every 4 weeks. First dose 07/19/2017. Status post 14 cycles.  INTERVAL HISTORY: Anthony Curry. 77 y.o. male returns to the clinic today for follow-up visit accompanied by his wife.  The patient is feeling fine today with no concerning complaints except for mild fatigue and dizzy spells.  The patient denied having any chest pain, shortness breath, cough or hemoptysis.  He denied having any fever or chills.  He has no nausea, vomiting, diarrhea or constipation.  He denied having any significant weight loss or night sweats.  He has no headache or visual changes.  He is here today for evaluation before starting cycle #15 of nivolumab.   MEDICAL HISTORY: Past Medical History:  Diagnosis Date  . 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HCC)   . Adrenal mass  (HCC)   . Alcoholism in remission (HCC)   . Anemia of chronic disease   . 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 Anxiety   . BPH (benign prostatic hyperplasia)   . Cancer (HCC)    small cell/lung  . Complication of anesthesia 09/08/2015    JITTERY AFTER GALLBLADDER SURGERY  . GERD (gastroesophageal reflux disease)   . History of radiation therapy 11/14/16, 11/18/16, 11/22/16   Left upper lung: 54 Gy in 3 fractions  . Hypertension   . Hypertension 12/07/2016  . Neoplastic malignant related fatigue    IV tx. every 2 weeks last9-14-16 (Dr. Mohammed), radiation last tx. 6 months  . 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 Persistent atrial fibrillation (HCC)   . Pulmonary nodules   . Radiation 12/02/14-12/16/14   Left adrenal gland 30 Gy in 10 fractions    ALLERGIES:  has No Known Allergies.  MEDICATIONS:  Current Outpatient Medications  Medication Sig Dispense Refill  . ALPRAZolam (XANAX) 0.25 MG tablet TAKE 1 TABLET BY MOUTH TWICE A DAY AS NEEDED FOR ANXIETY AND AS NEEDED FOR SLEEP 60 tablet 0  . apixaban (ELIQUIS) 5 MG TABS tablet Take 5 mg by mouth 2 (two) times daily.    . Ascorbic Acid (VITAMIN C PO) Take 1,000 mg by mouth daily.     . B Complex-C (SUPER B COMPLEX PO) Take 1 each by mouth daily.     . calcium carbonate (OS-CAL) 600 MG TABS Take 600 mg by mouth daily with breakfast.    . demeclocycline (DECLOMYCIN) 150 MG tablet Take 150 mg by   mouth 2 (two) times daily.    [name] diltiazem (CARDIZEM CD) 180 MG 24 hr capsule Take 1 capsule (180 mg total) by mouth daily at 12 noon. 90 capsule 3  . furosemide (LASIX) 40 MG tablet Take 0.5 tablets (20 mg total) by mouth every other day.    [name] HYDROcodone-acetaminophen (NORCO/VICODIN) 5-325 MG tablet Take 1 tablet by mouth every 6 (six) hours as needed for moderate pain. 40 tablet 0  . KLOR-CON M20 20 MEQ tablet Take 20 mEq by mouth every morning.     . Lactobacillus (ACIDOPHILUS) CAPS capsule Take 1 capsule by mouth  daily.    [name] levothyroxine (SYNTHROID, LEVOTHROID) 50 MCG tablet TAKE 1 TABLET (50 MCG TOTAL) BY MOUTH DAILY BEFORE BREAKFAST. 90 tablet 0  . MAGNESIUM PO Take 400 mg by mouth daily.     . Multiple Vitamin (MULTIVITAMIN WITH MINERALS) TABS Take 1 tablet by mouth daily.    . Nivolumab (OPDIVO IV) Inject as directed every 2 weeks    . Nutritional Supplements (ENSURE COMPLETE PO) Take 1 Can by mouth 2 (two) times daily.    . Omega-3 Fatty Acids (FISH OIL PO) Take 1 tablet by mouth daily.    . pantoprazole (PROTONIX) 40 MG tablet Take 40 mg by mouth every morning.  8  . tamsulosin (FLOMAX) 0.4 MG CAPS capsule Take 0.4 mg by mouth daily at 12 noon.     . thiamine (VITAMIN B-1) 100 MG tablet Take 1 tablet (100 mg total) by mouth daily. 30 tablet 6  . vitamin E 400 UNIT capsule Take 400 Units by mouth daily.     No current facility-administered medications for this visit.     SURGICAL HISTORY:  Past Surgical History:  Procedure Laterality Date  . ANKLE ARTHRODESIS  1995   rt fx-hardware in  . CARDIOVERSION N/A 09/29/2014   Procedure: CARDIOVERSION;  Surgeon: Josue Hector, MD;  Location: [facility] ENDOSCOPY;  Service: Cardiovascular;  Laterality: N/A;  . CHOLECYSTECTOMY  09/08/2015    laproscopic   . CHOLECYSTECTOMY N/A 09/08/2015   Procedure: LAPAROSCOPIC CHOLECYSTECTOMY WITH ATTEMPTED INTRAOPERATIVE CHOLANGIOGRAM ;  Surgeon: Fanny Skates, MD;  Location: [place];  Service: General;  Laterality: N/A;  . COLONOSCOPY    . ERCP N/A 09/02/2015   Procedure: ENDOSCOPIC RETROGRADE CHOLANGIOPANCREATOGRAPHY (ERCP);  Surgeon: Arta Silence, MD;  Location: [facility] ENDOSCOPY;  Service: Endoscopy;  Laterality: N/A;  . ERCP N/A 09/07/2015   Procedure: ENDOSCOPIC RETROGRADE CHOLANGIOPANCREATOGRAPHY (ERCP);  Surgeon: Clarene Essex, MD;  Location: [facility] ENDOSCOPY;  Service: Endoscopy;  Laterality: N/A;  . EXCISION / CURETTAGE BONE CYST FINGER  2005   rt thumb  . HEMORRHOID SURGERY    . Percutaneous Cholecytostomy tube      IVR insertion 06-13-15(Dr. Vernard Gambles)- 08-28-15 remains in place to drainage bag  . TONSILLECTOMY    . TOTAL SHOULDER ARTHROPLASTY  02/03/2012   Procedure: TOTAL SHOULDER ARTHROPLASTY;  Surgeon: [name], MD;  Location: [place];  Service: Orthopedics;  Laterality: Left;    REVIEW OF SYSTEMS:  A comprehensive review of systems was negative except for: Constitutional: positive for fatigue Neurological: positive for dizziness   PHYSICAL EXAMINATION: General appearance: alert, cooperative, fatigued and no distress Head: Normocephalic, without obvious abnormality, atraumatic Neck: no adenopathy, no JVD, supple, symmetrical, trachea midline and thyroid not enlarged, symmetric, no tenderness/mass/nodules Lymph nodes: Cervical, supraclavicular, and axillary nodes normal. Resp: clear to auscultation bilaterally Back: symmetric, no curvature. ROM normal. No CVA tenderness. Cardio: regular rate and rhythm, S1, S2 normal,  no murmur, click, rub or gallop GI: soft, non-tender; bowel sounds normal; no masses,  no organomegaly Extremities: extremities normal, atraumatic, no cyanosis or edema  ECOG PERFORMANCE STATUS: 1 - Symptomatic but completely ambulatory  Blood pressure (!) 148/67, pulse 64, temperature 98.6 F (37 C), temperature source Oral, resp. rate 18, height 5' 11" (1.803 m), weight 143 lb (64.9 kg), SpO2 100 %.  LABORATORY DATA: Lab Results  Component Value Date   WBC 4.0 08/15/2018   HGB 13.8 08/15/2018   HCT 40.4 08/15/2018   MCV 97.5 08/15/2018   PLT 219 08/15/2018      Chemistry      Component Value Date/Time   NA 132 (L) 08/15/2018 0806   NA 132 (L) 12/06/2017 0930   K 4.9 08/15/2018 0806   K 4.0 12/06/2017 0930   CL 100 08/15/2018 0806   CO2 23 08/15/2018 0806   CO2 26 12/06/2017 0930   BUN 17 08/15/2018 0806   BUN 15.4 12/06/2017 0930   CREATININE 0.89 08/15/2018 0806   CREATININE 0.9 12/06/2017 0930      Component Value Date/Time   CALCIUM 9.4  08/15/2018 0806   CALCIUM 9.9 12/06/2017 0930   ALKPHOS 96 08/15/2018 0806   ALKPHOS 107 12/06/2017 0930   AST 18 08/15/2018 0806   AST 20 12/06/2017 0930   ALT 10 08/15/2018 0806   ALT 14 12/06/2017 0930   BILITOT 0.5 08/15/2018 0806   BILITOT 0.76 12/06/2017 0930       RADIOGRAPHIC STUDIES: No results found.  ASSESSMENT AND PLAN:  This is a very pleasant 76 years old white male with metastatic non-small cell lung cancer, adenocarcinoma and currently on treatment with second line immunotherapy with Nivolumab status post 60 cycles and has been tolerating his treatment well. He was recently switched to Nivolumab 480 MG IV every 4 weeks status post 14 cycles. The patient continues to tolerate this treatment well with no concerning adverse effects. I recommended for him to proceed with cycle #15 today as scheduled. For the persistent dizzy spells and peripheral neuropathy, I will refer the patient to Dr. Vaslow for evaluation and recommendation regarding his condition. He will come back for follow-up visit in 4 weeks for evaluation before the next cycle of his treatment. He was advised to call immediately if he has any concerning symptoms in the interval. The patient voices understanding of current disease status and treatment options and is in agreement with the current care plan. All questions were answered. The patient knows to call the clinic with any problems, questions or concerns. We can certainly see the patient much sooner if necessary.  Disclaimer: This note was dictated with voice recognition software. Similar sounding words can inadvertently be transcribed and may not be corrected upon review.       

## 2018-08-15 NOTE — Patient Instructions (Signed)
[facility] Discharge Instructions for Patients Receiving Chemotherapy  Today you received the following chemotherapy agents nivolumab   To help prevent nausea and vomiting after your treatment, we encourage you to take your nausea medication as directed   If you develop nausea and vomiting that is not controlled by your nausea medication, call the clinic.   BELOW ARE SYMPTOMS THAT SHOULD BE REPORTED IMMEDIATELY:  *FEVER GREATER THAN 100.5 F  *CHILLS WITH OR WITHOUT FEVER  NAUSEA AND VOMITING THAT IS NOT CONTROLLED WITH YOUR NAUSEA MEDICATION  *UNUSUAL SHORTNESS OF BREATH  *UNUSUAL BRUISING OR BLEEDING  TENDERNESS IN MOUTH AND THROAT WITH OR WITHOUT PRESENCE OF ULCERS  *URINARY PROBLEMS  *BOWEL PROBLEMS  UNUSUAL RASH Items with * indicate a potential emergency and should be followed up as soon as possible.  Feel free to call the clinic you have any questions or concerns. The clinic phone number is [phone].  

## 2018-08-23 ENCOUNTER — Telehealth: Payer: Self-pay

## 2018-08-23 ENCOUNTER — Encounter: Payer: Self-pay | Admitting: Internal Medicine

## 2018-08-23 ENCOUNTER — Inpatient Hospital Stay (HOSPITAL_BASED_OUTPATIENT_CLINIC_OR_DEPARTMENT_OTHER): Payer: Medicare Other | Admitting: Internal Medicine

## 2018-08-23 ENCOUNTER — Other Ambulatory Visit: Payer: Self-pay

## 2018-08-23 VITALS — BP 143/64 | HR 60 | Temp 97.7°F | Resp 18 | Ht 71.0 in | Wt 142.8 lb

## 2018-08-23 DIAGNOSIS — Z5112 Encounter for antineoplastic immunotherapy: Secondary | ICD-10-CM | POA: Diagnosis not present

## 2018-08-23 DIAGNOSIS — Z79899 Other long term (current) drug therapy: Secondary | ICD-10-CM | POA: Diagnosis not present

## 2018-08-23 DIAGNOSIS — G620 Drug-induced polyneuropathy: Secondary | ICD-10-CM

## 2018-08-23 DIAGNOSIS — T451X5A Adverse effect of antineoplastic and immunosuppressive drugs, initial encounter: Secondary | ICD-10-CM | POA: Diagnosis not present

## 2018-08-23 DIAGNOSIS — C3431 Malignant neoplasm of lower lobe, right bronchus or lung: Secondary | ICD-10-CM | POA: Diagnosis not present

## 2018-08-23 DIAGNOSIS — R42 Dizziness and giddiness: Secondary | ICD-10-CM | POA: Diagnosis not present

## 2018-08-23 DIAGNOSIS — R5383 Other fatigue: Secondary | ICD-10-CM | POA: Diagnosis not present

## 2018-08-23 DIAGNOSIS — Z87891 Personal history of nicotine dependence: Secondary | ICD-10-CM | POA: Diagnosis not present

## 2018-08-23 NOTE — Progress Notes (Signed)
[facility] at [place], [place] [address] [phone]   New Patient Evaluation  Date of Service: 08/23/18 Patient Name: Anthony Curry. Patient MRN: [id number] Patient DOB: 08/03/1941 Provider: Ventura Sellers, MD  Identifying Statement:  Anthony Jefferson. is a 77 y.o. male with Chemotherapy-induced neuropathy ([place]) [G62.0, T45.1X5A] who presents for initial consultation and evaluation regarding cancer associated neurologic deficits.    Referring Provider: [name], [place], [place] [address]  Primary Cancer: [facility] Lung Stage IV  History of Present Illness: The patient's records from the referring physician were obtained and reviewed and the patient interviewed to confirm this HPI.  Anthony Jefferson. presents to review neuropathy symptoms today.  He describes painful burning sensation in his feet and lower legs, up to mid-shin.  There is no involvement of the fingertips or hands.  The symptoms are generally tolerable during the day, and bothersome at night, often interfering with his sleep.  He takes hydrocodone and/or xanax at night so that he can get comfortable sleep.  He also describes numbness in his legs, as well as poor balance.  He uses a walker for most of his ambulation including throughout the home.  It is especially difficult for him to maintain his balance when eyes are closed or he's in a dark room.  Had exposure to platinum based chemotherapy in 2015 and 2016, though currently he is on single agent nivolumab for lung cancer.    Medications: Current Outpatient Medications on File Prior to Visit  Medication Sig Dispense Refill  . ALPRAZolam (XANAX) 0.25 MG tablet TAKE 1 TABLET BY MOUTH TWICE A DAY AS NEEDED FOR ANXIETY AND AS NEEDED FOR SLEEP 60 tablet 0  . apixaban (ELIQUIS) 5 MG TABS tablet Take 5 mg by mouth 2 (two) times daily.    . Ascorbic Acid (VITAMIN C PO) Take 1,000 mg by  mouth daily.     . B Complex-C (SUPER B COMPLEX PO) Take 1 each by mouth daily.     . calcium carbonate (OS-CAL) 600 MG TABS Take 600 mg by mouth daily with breakfast.    . demeclocycline (DECLOMYCIN) 150 MG tablet Take 150 mg by mouth 2 (two) times daily.    [name] diltiazem (CARDIZEM CD) 180 MG 24 hr capsule Take 1 capsule (180 mg total) by mouth daily at 12 noon. 90 capsule 3  . furosemide (LASIX) 40 MG tablet Take 0.5 tablets (20 mg total) by mouth every other day.    [name] HYDROcodone-acetaminophen (NORCO/VICODIN) 5-325 MG tablet Take 1 tablet by mouth every 6 (six) hours as needed for moderate pain. 40 tablet 0  . KLOR-CON M20 20 MEQ tablet Take 20 mEq by mouth every morning.     . Lactobacillus (ACIDOPHILUS) CAPS capsule Take 1 capsule by mouth daily.    [name] levothyroxine (SYNTHROID, LEVOTHROID) 50 MCG tablet TAKE 1 TABLET (50 MCG TOTAL) BY MOUTH DAILY BEFORE BREAKFAST. 90 tablet 0  . MAGNESIUM PO Take 400 mg by mouth daily.     . Multiple Vitamin (MULTIVITAMIN WITH MINERALS) TABS Take 1 tablet by mouth daily.    . Nivolumab (OPDIVO IV) Inject as directed every 2 weeks    . nivolumab 480 mg in sodium chloride 0.9 % 100 mL Inject as directed every 2 weeks    . Nutritional Supplements (ENSURE COMPLETE PO) Take 1 Can by mouth 2 (two) times daily.    [name]  Omega-3 Fatty Acids (FISH OIL PO) Take 1 tablet by mouth daily.    . pantoprazole (PROTONIX) 40 MG tablet Take 40 mg by mouth every morning.  8  . tamsulosin (FLOMAX) 0.4 MG CAPS capsule Take 0.4 mg by mouth daily at 12 noon.     . thiamine (VITAMIN B-1) 100 MG tablet Take 1 tablet (100 mg total) by mouth daily. 30 tablet 6  . vitamin E 400 UNIT capsule Take 400 Units by mouth daily.     No current facility-administered medications on file prior to visit.     Allergies: No Known Allergies Past Medical History:  Past Medical History:  Diagnosis Date  . 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[place])   . Adrenal mass ([place])   . Alcoholism in remission ([place])   . Anemia of  chronic disease   . 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 Anxiety   . BPH (benign prostatic hyperplasia)   . Cancer ([place])    small cell/lung  . Complication of anesthesia 09/08/2015    JITTERY AFTER GALLBLADDER SURGERY  . GERD (gastroesophageal reflux disease)   . History of radiation therapy 11/14/16, 11/18/16, 11/22/16   Left upper lung: 54 Gy in 3 fractions  . Hypertension   . Hypertension 12/07/2016  . Neoplastic malignant related fatigue    IV tx. every 2 weeks last9-14-16 (Dr. Earlie Server), radiation last tx. 6 months  . 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 Persistent atrial fibrillation ([place])   . Pulmonary nodules   . Radiation 12/02/14-12/16/14   Left adrenal gland 30 Gy in 10 fractions   Past Surgical History:  Past Surgical History:  Procedure Laterality Date  . ANKLE ARTHRODESIS  1995   rt fx-hardware in  . CARDIOVERSION N/A 09/29/2014   Procedure: CARDIOVERSION;  Surgeon: Josue Hector, MD;  Location: [facility] ENDOSCOPY;  Service: Cardiovascular;  Laterality: N/A;  . CHOLECYSTECTOMY  09/08/2015    laproscopic   . CHOLECYSTECTOMY N/A 09/08/2015   Procedure: LAPAROSCOPIC CHOLECYSTECTOMY WITH ATTEMPTED INTRAOPERATIVE CHOLANGIOGRAM ;  Surgeon: Fanny Skates, MD;  Location: [place];  Service: General;  Laterality: N/A;  . COLONOSCOPY    . ERCP N/A 09/02/2015   Procedure: ENDOSCOPIC RETROGRADE CHOLANGIOPANCREATOGRAPHY (ERCP);  Surgeon: Arta Silence, MD;  Location: [facility];  Service: Endoscopy;  Laterality: N/A;  . ERCP N/A 09/07/2015   Procedure: ENDOSCOPIC RETROGRADE CHOLANGIOPANCREATOGRAPHY (ERCP);  Surgeon: Clarene Essex, MD;  Location: [facility] ENDOSCOPY;  Service: Endoscopy;  Laterality: N/A;  . EXCISION / CURETTAGE BONE CYST FINGER  2005   rt thumb  . HEMORRHOID SURGERY    . Percutaneous Cholecytostomy tube     IVR insertion 06-13-15(Dr. Vernard Gambles)- 08-28-15 remains in place to drainage bag  . TONSILLECTOMY    . TOTAL SHOULDER ARTHROPLASTY  02/03/2012    Procedure: TOTAL SHOULDER ARTHROPLASTY;  Surgeon: [name], MD;  Location: [place];  Service: Orthopedics;  Laterality: Left;   Social History:  Social History   Socioeconomic History  . Marital status: Married    Spouse name: Ken Bonn  . Number of children: 3  . Years of education: 33  . Highest education level: Not on file  Occupational History  . 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 Financial resource strain: Not on file  . Food insecurity:    Worry: Not on file    Inability: Not on file  . Transportation needs:    Medical: Not on file    Non-medical:  Not on file  Tobacco Use  . Smoking status: Former Smoker    Packs/day: 2.00    Years: 30.00    Pack years: 60.00    Types: Cigarettes    Last attempt to quit: 12/05/1990    Years since quitting: 27.7  . Smokeless tobacco: Never Used  Substance and Sexual Activity  . Alcohol use: Yes    Alcohol/week: 0.0 standard drinks    Comment: past hx.ETOH abuse,08-31-15 "occ. wine ,drinks non alcoholic beer occ."  . Drug use: No  . Sexual activity: Not Currently  Lifestyle  . Physical activity:    Days per week: Not on file    Minutes per session: Not on file  . Stress: Not on file  Relationships  . Social connections:    Talks on phone: Not on file    Gets together: Not on file    Attends religious service: Not on file    Active member of club or organization: Not on file    Attends meetings of clubs or organizations: Not on file    Relationship status: Not on file  . Intimate partner violence:    Fear of current or ex partner: Not on file    Emotionally abused: Not on file    Physically abused: Not on file    Forced sexual activity: Not on file  Other Topics Concern  . Not on file  Social History Narrative   Lives at home with wife.   Caffeine use: Drinks coffee- 2 cups per day   Retired from [employer] History:  Family History   Problem Relation Age of Onset  . Lung cancer Mother   . Hypertension Father   . Heart attack Father   . Diabetes Paternal Grandmother   . Brain cancer Brother   . Heart attack Brother   . Neuropathy Neg Hx     Review of Systems: Constitutional: Denies fevers, chills or abnormal weight loss Eyes: Denies blurriness of vision Ears, nose, mouth, throat, and face: Denies mucositis or sore throat Respiratory: Denies cough, dyspnea or wheezes Cardiovascular: Denies palpitation, chest discomfort or lower extremity swelling Gastrointestinal:  Denies nausea, constipation, diarrhea GU: Denies dysuria or incontinence Skin: Denies abnormal skin rashes Neurological: Per HPI Musculoskeletal: Denies joint pain, back or neck discomfort. No decrease in ROM Behavioral/Psych: Denies anxiety, disturbance in thought content, and mood instability   Physical Exam: Vitals:   08/23/18 0941  BP: (!) 143/64  Pulse: 60  Resp: 18  Temp: 97.7 F (36.5 C)  SpO2: 98%   KPS: 60. General: Alert, cooperative, pleasant, in no acute distress Head: Craniotomy scar noted, dry and intact. EENT: No conjunctival injection or scleral icterus. Oral mucosa moist Lungs: Resp effort normal Cardiac: Regular rate and rhythm Abdomen: Soft, non-distended abdomen Skin: No rashes cyanosis or petechiae. Extremities: No clubbing or edema  Neurologic Exam: Mental Status: Awake, alert, attentive to examiner. Oriented to self and environment. Language is fluent with intact comprehension.  Cranial Nerves: Visual acuity is grossly normal. Visual fields are full. Extra-ocular movements intact. No ptosis. Face is symmetric, tongue midline. Motor: Tone and bulk are normal. Power is full in both arms and legs. Reflexes are trace diffusely, no pathologic reflexes present. Intact finger to nose bilaterally Sensory: Impaired to lower legs in stocking pattern Gait: Sensory ataxia   Labs: I have reviewed the data as listed      Component Value Date/Time   NA 132 (L) 08/15/2018 0806   NA 132 (L)  12/06/2017 0930   K 4.9 08/15/2018 0806   K 4.0 12/06/2017 0930   CL 100 08/15/2018 0806   CO2 23 08/15/2018 0806   CO2 26 12/06/2017 0930   GLUCOSE 85 08/15/2018 0806   GLUCOSE 89 12/06/2017 0930   BUN 17 08/15/2018 0806   BUN 15.4 12/06/2017 0930   CREATININE 0.89 08/15/2018 0806   CREATININE 0.9 12/06/2017 0930   CALCIUM 9.4 08/15/2018 0806   CALCIUM 9.9 12/06/2017 0930   PROT 6.1 (L) 08/15/2018 0806   PROT 6.3 (L) 12/06/2017 0930   ALBUMIN 3.8 08/15/2018 0806   ALBUMIN 3.7 12/06/2017 0930   AST 18 08/15/2018 0806   AST 20 12/06/2017 0930   ALT 10 08/15/2018 0806   ALT 14 12/06/2017 0930   ALKPHOS 96 08/15/2018 0806   ALKPHOS 107 12/06/2017 0930   BILITOT 0.5 08/15/2018 0806   BILITOT 0.76 12/06/2017 0930   GFRNONAA >60 08/15/2018 0806   GFRAA >60 08/15/2018 0806   Lab Results  Component Value Date   WBC 4.0 08/15/2018   NEUTROABS 2.0 08/15/2018   HGB 13.8 08/15/2018   HCT 40.4 08/15/2018   MCV 97.5 08/15/2018   PLT 219 08/15/2018    Assessment/Plan 1. Chemotherapy-induced neuropathy [facility])  Mr. Deridder presents today with a static length dependent neuropathy with small and large sensory fiber involvement.  There is no discernable motor involvement.  The etiology is "two-hit" platinum based chemotherapy exposure with history of alcohol abuse.   We discussed trying Amytryptilline to help with neuropathic and sleep issues at night.  This could be dosed at 50mg  HS.  We counseled him against frequent use of narcotics and benzodiazepines in this setting.   We additionally recommended potential for neuro-PT to address his ataxia, which is due to effect of neuropathy on propioceptive pathways.     We spent twenty additional minutes teaching regarding the natural history, biology, and historical experience in the treatment of neurologic complications of cancer. We also provided teaching sheets for the  patient to take home as an additional resource.  We appreciate the opportunity to participate in the care of Anthony Curry.[name]   He will contact us if he wants to pursue either of these therapies.  We are happy to see him at any time.  All questions were answered. The patient knows to call the clinic with any problems, questions or concerns. No barriers to learning were detected.  The total time spent in the encounter was 40 minutes and more than 50% was on counseling and review of test results   Ventura Sellers, MD Medical Director of Neuro-Oncology [name] at [place] 08/23/18 2:48 PM

## 2018-08-23 NOTE — Telephone Encounter (Signed)
Per 9/19 no los

## 2018-08-24 ENCOUNTER — Other Ambulatory Visit: Payer: Self-pay | Admitting: Internal Medicine

## 2018-08-24 ENCOUNTER — Telehealth: Payer: Self-pay | Admitting: *Deleted

## 2018-08-24 MED ORDER — AMITRIPTYLINE HCL 50 MG PO TABS: 50.0000 mg | ORAL_TABLET | Freq: Every day | ORAL | 5 refills | Status: DC

## 2018-08-24 NOTE — Telephone Encounter (Signed)
Called patient and informed him prescription had been sent to [facility] on [address]. He verbalized understanding.

## 2018-08-24 NOTE — Telephone Encounter (Signed)
Spoke with patient regarding yesterday's visit with Dr. Mickeal Skinner. Patient stated,"I have decided to try the medicine Elavil. I told Dr. Mickeal Skinner I would let him know. [facility] use the [facility] on [address]. Please call me back when the prescription has been sent in. Return number is [phone]."

## 2018-09-05 DIAGNOSIS — Z23 Encounter for immunization: Secondary | ICD-10-CM | POA: Diagnosis not present

## 2018-09-05 DIAGNOSIS — H353221 Exudative age-related macular degeneration, left eye, with active choroidal neovascularization: Secondary | ICD-10-CM | POA: Diagnosis not present

## 2018-09-05 DIAGNOSIS — H26492 Other secondary cataract, left eye: Secondary | ICD-10-CM | POA: Diagnosis not present

## 2018-09-07 ENCOUNTER — Other Ambulatory Visit: Payer: Self-pay | Admitting: *Deleted

## 2018-09-07 DIAGNOSIS — C3490 Malignant neoplasm of unspecified part of unspecified bronchus or lung: Secondary | ICD-10-CM

## 2018-09-07 DIAGNOSIS — G893 Neoplasm related pain (acute) (chronic): Secondary | ICD-10-CM

## 2018-09-07 MED ORDER — ALPRAZOLAM 0.25 MG PO TABS: 0.2500 mg | ORAL_TABLET | Freq: Two times a day (BID) | ORAL | 0 refills | Status: DC | PRN

## 2018-09-12 ENCOUNTER — Inpatient Hospital Stay (HOSPITAL_BASED_OUTPATIENT_CLINIC_OR_DEPARTMENT_OTHER): Payer: Medicare Other | Admitting: Internal Medicine

## 2018-09-12 ENCOUNTER — Inpatient Hospital Stay: Payer: Medicare Other | Attending: Internal Medicine

## 2018-09-12 ENCOUNTER — Other Ambulatory Visit: Payer: Self-pay | Admitting: Internal Medicine

## 2018-09-12 ENCOUNTER — Telehealth: Payer: Self-pay | Admitting: Internal Medicine

## 2018-09-12 ENCOUNTER — Inpatient Hospital Stay: Payer: Medicare Other

## 2018-09-12 ENCOUNTER — Encounter: Payer: Self-pay | Admitting: Internal Medicine

## 2018-09-12 VITALS — BP 143/74 | HR 65 | Temp 97.7°F | Resp 18 | Ht 71.0 in | Wt 142.2 lb

## 2018-09-12 DIAGNOSIS — C3431 Malignant neoplasm of lower lobe, right bronchus or lung: Secondary | ICD-10-CM

## 2018-09-12 DIAGNOSIS — R42 Dizziness and giddiness: Secondary | ICD-10-CM | POA: Insufficient documentation

## 2018-09-12 DIAGNOSIS — G893 Neoplasm related pain (acute) (chronic): Secondary | ICD-10-CM

## 2018-09-12 DIAGNOSIS — R52 Pain, unspecified: Secondary | ICD-10-CM | POA: Insufficient documentation

## 2018-09-12 DIAGNOSIS — Z5112 Encounter for antineoplastic immunotherapy: Secondary | ICD-10-CM

## 2018-09-12 DIAGNOSIS — G620 Drug-induced polyneuropathy: Secondary | ICD-10-CM | POA: Diagnosis not present

## 2018-09-12 DIAGNOSIS — C3490 Malignant neoplasm of unspecified part of unspecified bronchus or lung: Secondary | ICD-10-CM

## 2018-09-12 DIAGNOSIS — G629 Polyneuropathy, unspecified: Secondary | ICD-10-CM | POA: Diagnosis not present

## 2018-09-12 DIAGNOSIS — R530 Neoplastic (malignant) related fatigue: Secondary | ICD-10-CM

## 2018-09-12 DIAGNOSIS — Z79899 Other long term (current) drug therapy: Secondary | ICD-10-CM | POA: Insufficient documentation

## 2018-09-12 LAB — CMP (CANCER CENTER ONLY)
ALBUMIN: 3.7 g/dL (ref 3.5–5.0)
ALT: 14 U/L (ref 0–44)
AST: 20 U/L (ref 15–41)
Alkaline Phosphatase: 101 U/L (ref 38–126)
Anion gap: 9 (ref 5–15)
BUN: 17 mg/dL (ref 8–23)
CHLORIDE: 101 mmol/L (ref 98–111)
CO2: 25 mmol/L (ref 22–32)
CREATININE: 0.81 mg/dL (ref 0.61–1.24)
Calcium: 9.7 mg/dL (ref 8.9–10.3)
GFR, Est AFR Am: >60 mL/min (ref >60)
Glucose, Bld: 88 mg/dL (ref 70–99)
POTASSIUM: 4.6 mmol/L (ref 3.5–5.1)
Sodium: 135 mmol/L (ref 135–145)
TOTAL PROTEIN: 5.9 g/dL — AB (ref 6.5–8.1)
Total Bilirubin: 0.8 mg/dL (ref 0.3–1.2)

## 2018-09-12 LAB — CBC WITH DIFFERENTIAL (CANCER CENTER ONLY)
Abs Immature Granulocytes: 0.01 10*3/uL (ref 0.00–0.07)
Basophils Absolute: 0 10*3/uL (ref 0.0–0.1)
Basophils Relative: 1 %
Eosinophils Absolute: 0.3 10*3/uL (ref 0.0–0.5)
Eosinophils Relative: 6 %
HEMATOCRIT: 39.6 % (ref 39.0–52.0)
HEMOGLOBIN: 14 g/dL (ref 13.0–17.0)
IMMATURE GRANULOCYTES: 0 %
LYMPHS PCT: 22 %
Lymphs Abs: 0.9 10*3/uL (ref 0.7–4.0)
MCH: 33.3 pg (ref 26.0–34.0)
MCHC: 35.4 g/dL (ref 30.0–36.0)
MCV: 94.3 fL (ref 80.0–100.0)
MONO ABS: 0.6 10*3/uL (ref 0.1–1.0)
MONOS PCT: 15 %
Neutro Abs: 2.4 10*3/uL (ref 1.7–7.7)
Neutrophils Relative %: 56 %
Platelet Count: 228 10*3/uL (ref 150–400)
RBC: 4.2 MIL/uL — ABNORMAL LOW (ref 4.22–5.81)
RDW: 13 % (ref 11.5–15.5)
WBC Count: 4.3 10*3/uL (ref 4.0–10.5)
nRBC: 0 % (ref 0.0–0.2)

## 2018-09-12 LAB — TSH: TSH: 2.648 u[IU]/mL (ref 0.320–4.118)

## 2018-09-12 MED ORDER — HYDROCODONE-ACETAMINOPHEN 5-325 MG PO TABS: 1.0000 | ORAL_TABLET | Freq: Four times a day (QID) | ORAL | 0 refills | Status: DC | PRN

## 2018-09-12 MED ORDER — SODIUM CHLORIDE 0.9 % IV SOLN
Freq: Once | INTRAVENOUS | Status: AC
  Administered 2018-09-12: 10:00:00 via INTRAVENOUS
  Filled 2018-09-12: qty 250

## 2018-09-12 MED ORDER — SODIUM CHLORIDE 0.9 % IV SOLN
480.0000 mg | Freq: Once | INTRAVENOUS | Status: AC
  Administered 2018-09-12: 480 mg via INTRAVENOUS
  Filled 2018-09-12: qty 48

## 2018-09-12 NOTE — Progress Notes (Signed)
[place] [phone]   [phone]  OFFICE PROGRESS NOTE  [name], MD [place] [id number]  DIAGNOSIS: Stage IV (T1a, N0, M1b) non-small cell lung cancer, adenocarcinoma of the right lower lobe diagnosed in October 2014  Molecular profile: Negative forRET, ALK, BRAF, MET, EGFR.  Positive for ERBB2 A775T, [id number], KRAS G13E, MAP2K1 D67N (see full report).   PRIOR THERAPY:  1) Systemic chemotherapy with carboplatin for AUC of 5 and Alimta 500 mg/M2 every 3 weeks, status post 6 cycles with stable disease. First dose on 10/23/2013. 2) Maintenance systemic chemotherapy with single agent Alimta 500 mg/M2 every 3 weeks. Status post 8 cycles.  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 Sondra Come completed on 12/16/2014. 4) stereotactic radiotherapy to the enlarging right upper lobe lung nodule under the care of Dr. Sondra Come completed on 11/22/2016. 5) Immunotherapy with Nivolumab 240 mg IV every 2 weeks, status post 60 cycles. First cycle was given on 01/28/2015. Last dose 07/05/2017.  CURRENT THERAPY: Immunotherapy with Nivolumab 480 mg IV every 4 weeks. First dose 07/19/2017. Status post 15 cycles.  INTERVAL HISTORY: Anthony Curry. 77 y.o. male returns to the clinic today for follow-up visit accompanied by his wife.  The patient is feeling fine today with no specific complaints except for the mild peripheral neuropathy and dizzy spells.  He denied having any chest pain, shortness of breath, cough or hemoptysis.  He denied having any fever or chills.  He has no nausea, vomiting, diarrhea or constipation.  He continues to tolerate his treatment with Nivolumab fairly well.  The patient is here today for evaluation before starting cycle #16.   MEDICAL HISTORY: Past Medical History:  Diagnosis Date  . 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 Adrenal mass ([place])   . Alcoholism in  remission ([place])   . Anemia of chronic disease   . 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 Anxiety   . BPH (benign prostatic hyperplasia)   . Cancer ([place])    small cell/lung  . Complication of anesthesia 09/08/2015    JITTERY AFTER GALLBLADDER SURGERY  . GERD (gastroesophageal reflux disease)   . History of radiation therapy 11/14/16, 11/18/16, 11/22/16   Left upper lung: 54 Gy in 3 fractions  . Hypertension   . Hypertension 12/07/2016  . Neoplastic malignant related fatigue    IV tx. every 2 weeks last9-14-16 (Dr. Earlie Server), radiation last tx. 6 months  . 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 Persistent atrial fibrillation ([place])   . Pulmonary nodules   . Radiation 12/02/14-12/16/14   Left adrenal gland 30 Gy in 10 fractions    ALLERGIES:  has No Known Allergies.  MEDICATIONS:  Current Outpatient Medications  Medication Sig Dispense Refill  . ALPRAZolam (XANAX) 0.25 MG tablet Take 1 tablet (0.25 mg total) by mouth 2 (two) times daily as needed for anxiety or sleep. 60 tablet 0  . amitriptyline (ELAVIL) 50 MG tablet Take 1 tablet (50 mg total) by mouth at bedtime. 30 tablet 5  . apixaban (ELIQUIS) 5 MG TABS tablet Take 5 mg by mouth 2 (two) times daily.    . Ascorbic Acid (VITAMIN C PO) Take 1,000 mg by mouth daily.     . B Complex-C (SUPER B COMPLEX PO) Take 1 each by mouth daily.     . calcium carbonate (OS-CAL) 600 MG TABS Take 600 mg by mouth daily with  breakfast.    . demeclocycline (DECLOMYCIN) 150 MG tablet Take 150 mg by mouth 2 (two) times daily.    [name] diltiazem (CARDIZEM CD) 180 MG 24 hr capsule Take 1 capsule (180 mg total) by mouth daily at 12 noon. 90 capsule 3  . furosemide (LASIX) 40 MG tablet Take 0.5 tablets (20 mg total) by mouth every other day.    [name] HYDROcodone-acetaminophen (NORCO/VICODIN) 5-325 MG tablet Take 1 tablet by mouth every 6 (six) hours as needed for moderate pain. 40 tablet 0  . KLOR-CON M20 20 MEQ tablet Take 20 mEq by mouth every  morning.     . Lactobacillus (ACIDOPHILUS) CAPS capsule Take 1 capsule by mouth daily.    [name] levothyroxine (SYNTHROID, LEVOTHROID) 50 MCG tablet TAKE 1 TABLET (50 MCG TOTAL) BY MOUTH DAILY BEFORE BREAKFAST. 90 tablet 0  . MAGNESIUM PO Take 400 mg by mouth daily.     . Multiple Vitamin (MULTIVITAMIN WITH MINERALS) TABS Take 1 tablet by mouth daily.    . Nivolumab (OPDIVO IV) Inject as directed every 2 weeks    . nivolumab 480 mg in sodium chloride 0.9 % 100 mL Inject as directed every 2 weeks    . Nutritional Supplements (ENSURE COMPLETE PO) Take 1 Can by mouth 2 (two) times daily.    . Omega-3 Fatty Acids (FISH OIL PO) Take 1 tablet by mouth daily.    . pantoprazole (PROTONIX) 40 MG tablet Take 40 mg by mouth every morning.  8  . tamsulosin (FLOMAX) 0.4 MG CAPS capsule Take 0.4 mg by mouth daily at 12 noon.     . thiamine (VITAMIN B-1) 100 MG tablet Take 1 tablet (100 mg total) by mouth daily. 30 tablet 6  . vitamin E 400 UNIT capsule Take 400 Units by mouth daily.     No current facility-administered medications for this visit.     SURGICAL HISTORY:  Past Surgical History:  Procedure Laterality Date  . ANKLE ARTHRODESIS  1995   rt fx-hardware in  . CARDIOVERSION N/A 09/29/2014   Procedure: CARDIOVERSION;  Surgeon: Josue Hector, MD;  Location: [facility] ENDOSCOPY;  Service: Cardiovascular;  Laterality: N/A;  . CHOLECYSTECTOMY  09/08/2015    laproscopic   . CHOLECYSTECTOMY N/A 09/08/2015   Procedure: LAPAROSCOPIC CHOLECYSTECTOMY WITH ATTEMPTED INTRAOPERATIVE CHOLANGIOGRAM ;  Surgeon: Fanny Skates, MD;  Location: [facility];  Service: General;  Laterality: N/A;  . COLONOSCOPY    . ERCP N/A 09/02/2015   Procedure: ENDOSCOPIC RETROGRADE CHOLANGIOPANCREATOGRAPHY (ERCP);  Surgeon: Arta Silence, MD;  Location: [facility] ENDOSCOPY;  Service: Endoscopy;  Laterality: N/A;  . ERCP N/A 09/07/2015   Procedure: ENDOSCOPIC RETROGRADE CHOLANGIOPANCREATOGRAPHY (ERCP);  Surgeon: Clarene Essex, MD;  Location: [facility] ENDOSCOPY;   Service: Endoscopy;  Laterality: N/A;  . EXCISION / CURETTAGE BONE CYST FINGER  2005   rt thumb  . HEMORRHOID SURGERY    . Percutaneous Cholecytostomy tube     IVR insertion 06-13-15(Dr. Vernard Gambles)- 08-28-15 remains in place to drainage bag  . TONSILLECTOMY    . TOTAL SHOULDER ARTHROPLASTY  02/03/2012   Procedure: TOTAL SHOULDER ARTHROPLASTY;  Surgeon: [name], MD;  Location: [facility];  Service: Orthopedics;  Laterality: Left;    REVIEW OF SYSTEMS:  A comprehensive review of systems was negative except for: Constitutional: positive for fatigue Neurological: positive for dizziness and paresthesia   PHYSICAL EXAMINATION: General appearance: alert, cooperative, fatigued and no distress Head: Normocephalic, without obvious abnormality, atraumatic Neck: no adenopathy, no JVD, supple, symmetrical, trachea midline and  thyroid not enlarged, symmetric, no tenderness/mass/nodules Lymph nodes: Cervical, supraclavicular, and axillary nodes normal. Resp: clear to auscultation bilaterally Back: symmetric, no curvature. ROM normal. No CVA tenderness. Cardio: regular rate and rhythm, S1, S2 normal, no murmur, click, rub or gallop GI: soft, non-tender; bowel sounds normal; no masses,  no organomegaly Extremities: extremities normal, atraumatic, no cyanosis or edema  ECOG PERFORMANCE STATUS: 1 - Symptomatic but completely ambulatory  Blood pressure (!) 143/74, pulse 65, temperature 97.7 F (36.5 C), temperature source Oral, resp. rate 18, height _0  (1.803 m), weight 142 lb 3.2 oz (64.5 kg), SpO2 100 %.  LABORATORY DATA: Lab Results  Component Value Date   WBC 4.3 09/12/2018   HGB 14.0 09/12/2018   HCT 39.6 09/12/2018   MCV 94.3 09/12/2018   PLT 228 09/12/2018      Chemistry      Component Value Date/Time   NA 135 09/12/2018 0840   NA 132 (L) 12/06/2017 0930   K 4.6 09/12/2018 0840   K 4.0 12/06/2017 0930   CL 101 09/12/2018 0840   CO2 25 09/12/2018 0840   CO2 26  12/06/2017 0930   BUN 17 09/12/2018 0840   BUN 15.4 12/06/2017 0930   CREATININE 0.81 09/12/2018 0840   CREATININE 0.9 12/06/2017 0930      Component Value Date/Time   CALCIUM 9.7 09/12/2018 0840   CALCIUM 9.9 12/06/2017 0930   ALKPHOS 101 09/12/2018 0840   ALKPHOS 107 12/06/2017 0930   AST 20 09/12/2018 0840   AST 20 12/06/2017 0930   ALT 14 09/12/2018 0840   ALT 14 12/06/2017 0930   BILITOT 0.8 09/12/2018 0840   BILITOT 0.76 12/06/2017 0930       RADIOGRAPHIC STUDIES: No results found.  ASSESSMENT AND PLAN:  This is a very pleasant 77 years old white male with metastatic non-small cell lung cancer, adenocarcinoma and currently on treatment with second line immunotherapy with Nivolumab status post 60 cycles and has been tolerating his treatment well. He was recently switched to Nivolumab 480 MG IV every 4 weeks status post 15 cycles. The patient continues to tolerate his treatment well with no concerning complaints. I recommended for him to proceed with cycle #16 today as scheduled. I will see him back for follow-up visit in 1 month for evaluation after repeating CT scan of the chest, abdomen and pelvis for restaging of his disease. For pain management I gave him a refill of hydrocodone. The patient was advised to call immediately if he has any concerning symptoms in the interval. The patient voices understanding of current disease status and treatment options and is in agreement with the current care plan. All questions were answered. The patient knows to call the clinic with any problems, questions or concerns. We can certainly see the patient much sooner if necessary.  Disclaimer: This note was dictated with voice recognition software. Similar sounding words can inadvertently be transcribed and may not be corrected upon review.

## 2018-09-12 NOTE — Telephone Encounter (Signed)
Appts already scheduled per 10/9 los - central radiology to contact patient with ct scan.

## 2018-09-12 NOTE — Patient Instructions (Signed)
[facility] Discharge Instructions for Patients Receiving Chemotherapy  Today you received the following chemotherapy agents nivolumab   To help prevent nausea and vomiting after your treatment, we encourage you to take your nausea medication as directed   If you develop nausea and vomiting that is not controlled by your nausea medication, call the clinic.   BELOW ARE SYMPTOMS THAT SHOULD BE REPORTED IMMEDIATELY:  *FEVER GREATER THAN 100.5 F  *CHILLS WITH OR WITHOUT FEVER  NAUSEA AND VOMITING THAT IS NOT CONTROLLED WITH YOUR NAUSEA MEDICATION  *UNUSUAL SHORTNESS OF BREATH  *UNUSUAL BRUISING OR BLEEDING  TENDERNESS IN MOUTH AND THROAT WITH OR WITHOUT PRESENCE OF ULCERS  *URINARY PROBLEMS  *BOWEL PROBLEMS  UNUSUAL RASH Items with * indicate a potential emergency and should be followed up as soon as possible.  Feel free to call the clinic you have any questions or concerns. The clinic phone number is [phone].  

## 2018-09-18 ENCOUNTER — Other Ambulatory Visit: Payer: Self-pay | Admitting: *Deleted

## 2018-09-18 MED ORDER — AMITRIPTYLINE HCL 50 MG PO TABS: 50.0000 mg | ORAL_TABLET | Freq: Every day | ORAL | 3 refills | Status: DC

## 2018-10-08 ENCOUNTER — Ambulatory Visit (HOSPITAL_COMMUNITY)
Admission: RE | Admit: 2018-10-08 | Discharge: 2018-10-08 | Disposition: A | Discharge status: Home / Self Care | Payer: Medicare Other | Source: Ambulatory Visit | Attending: Internal Medicine | Admitting: Internal Medicine

## 2018-10-08 DIAGNOSIS — J439 Emphysema, unspecified: Secondary | ICD-10-CM | POA: Diagnosis not present

## 2018-10-08 DIAGNOSIS — N3289 Other specified disorders of bladder: Secondary | ICD-10-CM | POA: Insufficient documentation

## 2018-10-08 DIAGNOSIS — C3490 Malignant neoplasm of unspecified part of unspecified bronchus or lung: Secondary | ICD-10-CM | POA: Insufficient documentation

## 2018-10-08 DIAGNOSIS — R918 Other nonspecific abnormal finding of lung field: Secondary | ICD-10-CM | POA: Diagnosis not present

## 2018-10-08 MED ORDER — IOHEXOL 300 MG/ML  SOLN
100.0000 mL | Freq: Once | INTRAMUSCULAR | Status: AC | PRN
  Administered 2018-10-08: 100 mL via INTRAVENOUS

## 2018-10-08 MED ORDER — SODIUM CHLORIDE 0.9 % IJ SOLN
INTRAMUSCULAR | Status: AC
  Filled 2018-10-08: qty 50

## 2018-10-10 ENCOUNTER — Encounter: Payer: Self-pay | Admitting: Oncology

## 2018-10-10 ENCOUNTER — Inpatient Hospital Stay: Payer: Medicare Other | Attending: Internal Medicine

## 2018-10-10 ENCOUNTER — Other Ambulatory Visit: Payer: Medicare Other

## 2018-10-10 ENCOUNTER — Ambulatory Visit: Payer: Medicare Other

## 2018-10-10 ENCOUNTER — Ambulatory Visit: Payer: Medicare Other | Admitting: Internal Medicine

## 2018-10-10 ENCOUNTER — Inpatient Hospital Stay (HOSPITAL_BASED_OUTPATIENT_CLINIC_OR_DEPARTMENT_OTHER): Payer: Medicare Other | Admitting: Oncology

## 2018-10-10 ENCOUNTER — Inpatient Hospital Stay: Payer: Medicare Other

## 2018-10-10 VITALS — BP 168/77 | HR 73 | Temp 97.8°F | Resp 12 | Ht 71.0 in | Wt 138.0 lb

## 2018-10-10 DIAGNOSIS — R42 Dizziness and giddiness: Secondary | ICD-10-CM

## 2018-10-10 DIAGNOSIS — C3431 Malignant neoplasm of lower lobe, right bronchus or lung: Secondary | ICD-10-CM | POA: Diagnosis not present

## 2018-10-10 DIAGNOSIS — G620 Drug-induced polyneuropathy: Secondary | ICD-10-CM | POA: Insufficient documentation

## 2018-10-10 DIAGNOSIS — Z79899 Other long term (current) drug therapy: Secondary | ICD-10-CM | POA: Insufficient documentation

## 2018-10-10 DIAGNOSIS — G893 Neoplasm related pain (acute) (chronic): Secondary | ICD-10-CM

## 2018-10-10 DIAGNOSIS — R52 Pain, unspecified: Secondary | ICD-10-CM | POA: Diagnosis not present

## 2018-10-10 DIAGNOSIS — Z5112 Encounter for antineoplastic immunotherapy: Secondary | ICD-10-CM

## 2018-10-10 DIAGNOSIS — R5383 Other fatigue: Secondary | ICD-10-CM | POA: Diagnosis not present

## 2018-10-10 DIAGNOSIS — R530 Neoplastic (malignant) related fatigue: Secondary | ICD-10-CM

## 2018-10-10 LAB — TSH: TSH: 2.642 u[IU]/mL (ref 0.320–4.118)

## 2018-10-10 LAB — CMP (CANCER CENTER ONLY)
ALT: 13 U/L (ref 0–44)
AST: 21 U/L (ref 15–41)
Albumin: 4 g/dL (ref 3.5–5.0)
Alkaline Phosphatase: 98 U/L (ref 38–126)
Anion gap: 7 (ref 5–15)
BILIRUBIN TOTAL: 1 mg/dL (ref 0.3–1.2)
BUN: 17 mg/dL (ref 8–23)
CHLORIDE: 101 mmol/L (ref 98–111)
CO2: 27 mmol/L (ref 22–32)
Calcium: 9.9 mg/dL (ref 8.9–10.3)
Creatinine: 0.87 mg/dL (ref 0.61–1.24)
GFR, Est AFR Am: >60 mL/min (ref >60)
GLUCOSE: 106 mg/dL — AB (ref 70–99)
Potassium: 4.8 mmol/L (ref 3.5–5.1)
Sodium: 135 mmol/L (ref 135–145)
TOTAL PROTEIN: 6.4 g/dL — AB (ref 6.5–8.1)

## 2018-10-10 LAB — CBC WITH DIFFERENTIAL (CANCER CENTER ONLY)
ABS IMMATURE GRANULOCYTES: 0 10*3/uL (ref 0.00–0.07)
BASOS PCT: 1 %
Basophils Absolute: 0 10*3/uL (ref 0.0–0.1)
Eosinophils Absolute: 0.2 10*3/uL (ref 0.0–0.5)
Eosinophils Relative: 6 %
HCT: 43.6 % (ref 39.0–52.0)
HEMOGLOBIN: 15.3 g/dL (ref 13.0–17.0)
IMMATURE GRANULOCYTES: 0 %
LYMPHS PCT: 26 %
Lymphs Abs: 1.1 10*3/uL (ref 0.7–4.0)
MCH: 33.2 pg (ref 26.0–34.0)
MCHC: 35.1 g/dL (ref 30.0–36.0)
MCV: 94.6 fL (ref 80.0–100.0)
Monocytes Absolute: 0.5 10*3/uL (ref 0.1–1.0)
Monocytes Relative: 13 %
NEUTROS ABS: 2.3 10*3/uL (ref 1.7–7.7)
NEUTROS PCT: 54 %
PLATELETS: 210 10*3/uL (ref 150–400)
RBC: 4.61 MIL/uL (ref 4.22–5.81)
RDW: 13.4 % (ref 11.5–15.5)
WBC: 4.1 10*3/uL (ref 4.0–10.5)
nRBC: 0 % (ref 0.0–0.2)

## 2018-10-10 MED ORDER — ALPRAZOLAM 0.25 MG PO TABS: 0.2500 mg | ORAL_TABLET | Freq: Two times a day (BID) | ORAL | 0 refills | Status: DC | PRN

## 2018-10-10 MED ORDER — SODIUM CHLORIDE 0.9% FLUSH
10.0000 mL | INTRAVENOUS | Status: DC | PRN
  Filled 2018-10-10: qty 10

## 2018-10-10 MED ORDER — SODIUM CHLORIDE 0.9 % IV SOLN
Freq: Once | INTRAVENOUS | Status: AC
  Administered 2018-10-10: 12:00:00 via INTRAVENOUS
  Filled 2018-10-10: qty 250

## 2018-10-10 MED ORDER — HYDROCODONE-ACETAMINOPHEN 5-325 MG PO TABS: 1.0000 | ORAL_TABLET | Freq: Four times a day (QID) | ORAL | 0 refills | Status: DC | PRN

## 2018-10-10 MED ORDER — HEPARIN SOD (PORK) LOCK FLUSH 100 UNIT/ML IV SOLN
500.0000 [IU] | Freq: Once | INTRAVENOUS | Status: DC | PRN
  Filled 2018-10-10: qty 5

## 2018-10-10 MED ORDER — SODIUM CHLORIDE 0.9 % IV SOLN
480.0000 mg | Freq: Once | INTRAVENOUS | Status: AC
  Administered 2018-10-10: 480 mg via INTRAVENOUS
  Filled 2018-10-10: qty 48

## 2018-10-10 NOTE — Assessment & Plan Note (Signed)
This is a very pleasant 77 year old white male with metastatic non-small cell lung cancer, adenocarcinoma and currently on treatment with second line immunotherapy with Nivolumab status post 60 cycles and has been tolerating his treatment well. He was recently switched to Nivolumab 480 MG IV every 4 weeks status post 16 cycles. The patient continues to tolerate his treatment well with no concerning complaints.  He had a restaging CT scan of the chest, abdomen, pelvis and is here to discuss the results.  The patient was seen with Dr. [name].  CT scan results were discussed with the patient is wife which shows stable disease.  Recommend that he continue on Nivolumab 480 mg every 4 weeks.  He will proceed with cycle #17 today as scheduled.  He will follow-up in 4 weeks for evaluation prior to cycle #18.  For pain management, I have given him a refill on hydrocodone.  For sleep, I have given him a refill on Xanax.  The patient was advised to call immediately if he has any concerning symptoms in the interval. The patient voices understanding of current disease status and treatment options and is in agreement with the current care plan. All questions were answered. The patient knows to call the clinic with any problems, questions or concerns. We can certainly see the patient much sooner if necessary.

## 2018-10-10 NOTE — Progress Notes (Signed)
[facility] OFF[facility] PROGRESS NOTE  [name], [place] [id number]  DIAGNOSIS: Stage IV (T1a, N0, M1b) non-small cell lung cancer, adenocarcinoma of the right lower lobe diagnosed in October 2014  Molecular profile: Negative forRET, ALK, BRAF, MET, EGFR.  Positive for ERBB2 A775T, [id number], KRAS G13E, MAP2K1 D67N (see full report).   PRIOR THERAPY:  1) Systemic chemotherapy with carboplatin for AUC of 5 and Alimta 500 mg/M2 every 3 weeks, status post 6 cycles with stable disease. First dose on 10/23/2013. 2) Maintenance systemic chemotherapy with single agent Alimta 500 mg/M2 every 3 weeks. Status post 8 cycles.  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 Sondra Come completed on 12/16/2014. 4) stereotactic radiotherapy to the enlarging right upper lobe lung nodule under the care of Dr. Sondra Come completed on 11/22/2016. 5) Immunotherapy with Nivolumab 240 mg IV every 2 weeks, status post 60 cycles. First cycle was given on 01/28/2015. Last dose 07/05/2017.  CURRENT THERAPY: Immunotherapy with Nivolumab 480 mg IV every 4 weeks. First dose 07/19/2017. Status post 16 cycles.  INTERVAL HISTORY: Anthony Curry. 77 y.o. male returns for routine follow-up visit accompanied by his wife.  The patient is feeling fine today and has no specific complaints except for ongoing peripheral neuropathy which is unchanged.  He denies fevers and chills.  Denies chest pain, shortness of breath, cough, hemoptysis.  Denies nausea, vomiting, constipation, diarrhea.  Denies recent weight loss or night sweats.  The patient had a restaging CT scan and is here to discuss the results.  MEDICAL HISTORY: Past Medical History:  Diagnosis Date  . 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[place])   . Adrenal mass ([place])   . Alcoholism in remission ([place])   . Anemia of chronic disease   . 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 Anxiety    . BPH (benign prostatic hyperplasia)   . Cancer ([place])    small cell/lung  . Complication of anesthesia 09/08/2015    JITTERY AFTER GALLBLADDER SURGERY  . GERD (gastroesophageal reflux disease)   . History of radiation therapy 11/14/16, 11/18/16, 11/22/16   Left upper lung: 54 Gy in 3 fractions  . Hypertension   . Hypertension 12/07/2016  . Neoplastic malignant related fatigue    IV tx. every 2 weeks last9-14-16 (Dr. Earlie Server), radiation last tx. 6 months  . 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 Persistent atrial fibrillation   . Pulmonary nodules   . Radiation 12/02/14-12/16/14   Left adrenal gland 30 Gy in 10 fractions    ALLERGIES:  has No Known Allergies.  MEDICATIONS:  Current Outpatient Medications  Medication Sig Dispense Refill  . ALPRAZolam (XANAX) 0.25 MG tablet Take 1 tablet (0.25 mg total) by mouth 2 (two) times daily as needed for anxiety or sleep. 60 tablet 0  . apixaban (ELIQUIS) 5 MG TABS tablet Take 5 mg by mouth 2 (two) times daily.    . Ascorbic Acid (VITAMIN C PO) Take 1,000 mg by mouth daily.     . B Complex-C (SUPER B COMPLEX PO) Take 1 each by mouth daily.     . calcium carbonate (OS-CAL) 600 MG TABS Take 600 mg by mouth daily with breakfast.    . demeclocycline (DECLOMYCIN) 150 MG tablet Take 150 mg by mouth 2 (two) times daily.    [name] diltiazem (CARDIZEM CD) 180 MG 24 hr capsule Take 1 capsule (180 mg total) by mouth daily at 12 noon. 90 capsule  3  . furosemide (LASIX) 40 MG tablet Take 0.5 tablets (20 mg total) by mouth every other day.    [name] HYDROcodone-acetaminophen (NORCO/VICODIN) 5-325 MG tablet Take 1 tablet by mouth every 6 (six) hours as needed for moderate pain. 40 tablet 0  . KLOR-CON M20 20 MEQ tablet Take 20 mEq by mouth every morning.     . Lactobacillus (ACIDOPHILUS) CAPS capsule Take 1 capsule by mouth daily.    [name] levothyroxine (SYNTHROID, LEVOTHROID) 50 MCG tablet TAKE 1 TABLET (50 MCG TOTAL) BY MOUTH DAILY BEFORE BREAKFAST. 90 tablet 0  .  MAGNESIUM PO Take 400 mg by mouth daily.     . Multiple Vitamin (MULTIVITAMIN WITH MINERALS) TABS Take 1 tablet by mouth daily.    . Nivolumab (OPDIVO IV) Inject as directed every 2 weeks    . nivolumab 480 mg in sodium chloride 0.9 % 100 mL Inject as directed every 2 weeks    . Nutritional Supplements (ENSURE COMPLETE PO) Take 1 Can by mouth 2 (two) times daily.    . Omega-3 Fatty Acids (FISH OIL PO) Take 1 tablet by mouth daily.    . pantoprazole (PROTONIX) 40 MG tablet Take 40 mg by mouth every morning.  8  . tamsulosin (FLOMAX) 0.4 MG CAPS capsule Take 0.4 mg by mouth daily at 12 noon.     . thiamine (VITAMIN B-1) 100 MG tablet Take 1 tablet (100 mg total) by mouth daily. 30 tablet 6  . vitamin E 400 UNIT capsule Take 400 Units by mouth daily.    [name] amitriptyline (ELAVIL) 50 MG tablet Take 1 tablet (50 mg total) by mouth at bedtime. (Patient not taking: Reported on 10/10/2018) 90 tablet 3   No current facility-administered medications for this visit.    Facility-Administered Medications Ordered in Other Visits  Medication Dose Route Frequency Provider Last Rate Last Dose  . heparin lock flush 100 unit/mL  500 Units Intracatheter Once PRN [name], MD      . nivolumab (OPDIVO) 480 mg in sodium chloride 0.9 % 100 mL chemo infusion  480 mg Intravenous Once [name], MD      . sodium chloride flush (NS) 0.9 % injection 10 mL  10 mL Intracatheter PRN [name], MD        SURGICAL HISTORY:  Past Surgical History:  Procedure Laterality Date  . ANKLE ARTHRODESIS  1995   rt fx-hardware in  . CARDIOVERSION N/A 09/29/2014   Procedure: CARDIOVERSION;  Surgeon: Josue Hector, MD;  Location: [facility] END[facility]COPY;  Service: Cardiovascular;  Laterality: N/A;  . CHOLECYSTECTOMY  09/08/2015    laproscopic   . CHOLECYSTECTOMY N/A 09/08/2015   Procedure: LAPAROSCOPIC CHOLECYSTECTOMY WITH ATTEMPTED INTRAOPERATIVE CHOLANGIOGRAM ;  Surgeon: Fanny Skates, MD;  Location: [facility];  Service:  General;  Laterality: N/A;  . COLONOSCOPY    . ERCP N/A 09/02/2015   Procedure: ENDOSCOPIC RETROGRADE CHOLANGIOPANCREATOGRAPHY (ERCP);  Surgeon: Arta Silence, MD;  Location: [facility] ENDOSCOPY;  Service: Endoscopy;  Laterality: N/A;  . ERCP N/A 09/07/2015   Procedure: ENDOSCOPIC RETROGRADE CHOLANGIOPANCREATOGRAPHY (ERCP);  Surgeon: Clarene Essex, MD;  Location: [facility] ENDOSCOPY;  Service: Endoscopy;  Laterality: N/A;  . EXCISION / CURETTAGE BONE CYST FINGER  2005   rt thumb  . HEMORRHOID SURGERY    . Percutaneous Cholecytostomy tube     IVR insertion 06-13-15(Dr. Vernard Gambles)- 08-28-15 remains in place to drainage bag  . TONSILLECTOMY    . TOTAL SHOULDER ARTHROPLASTY  02/03/2012   Procedure: TOTAL SHOULDER ARTHROPLASTY;  Surgeon: Vonna Kotyk  Llana Aliment, MD;  Location: [facility];  Service: Orthopedics;  Laterality: Left;    REVIEW OF SYSTEMS:   Review of Systems  Constitutional: Negative for appetite change, chills, fatigue, fever and unexpected weight change.  HENT:   Negative for mouth sores, nosebleeds, sore throat and trouble swallowing.   Eyes: Negative for eye problems and icterus.  Respiratory: Negative for cough, hemoptysis, shortness of breath and wheezing.   Cardiovascular: Negative for chest pain and leg swelling.  Gastrointestinal: Negative for abdominal pain, constipation, diarrhea, nausea and vomiting.  Genitourinary: Negative for bladder incontinence, difficulty urinating, dysuria, frequency and hematuria.   Musculoskeletal: Negative for back pain, gait problem, neck pain and neck stiffness.  Skin: Negative for itching and rash.  Neurological: Negative for dizziness, extremity weakness, gait problem, headaches, light-headedness and seizures.  He has baseline peripheral neuropathy. Hematological: Negative for adenopathy. Does not bruise/bleed easily.  Psychiatric/Behavioral: Negative for confusion, depression and sleep disturbance. The patient is not nervous/anxious.     PHYSICAL  EXAMINATION:  Blood pressure (!) 168/77, pulse 73, temperature 97.8 F (36.6 C), temperature source Oral, resp. rate 12, height _0  (1.803 m), weight 138 lb (62.6 kg), SpO2 99 %.  ECOG PERFORMANCE STATUS: 1 - Symptomatic but completely ambulatory  Physical Exam  Constitutional: Oriented to person, place, and time and well-developed, well-nourished, and in no distress. No distress.  HENT:  Head: Normocephalic and atraumatic.  Mouth/Throat: Oropharynx is clear and moist. No oropharyngeal exudate.  Eyes: Conjunctivae are normal. Right eye exhibits no discharge. Left eye exhibits no discharge. No scleral icterus.  Neck: Normal range of motion. Neck supple.  Cardiovascular: Normal rate, regular rhythm, normal heart sounds and intact distal pulses.   Pulmonary/Chest: Effort normal and breath sounds normal. No respiratory distress. No wheezes. No rales.  Abdominal: Soft. Bowel sounds are normal. Exhibits no distension and no mass. There is no tenderness.  Musculoskeletal: Normal range of motion. Exhibits no edema.  Lymphadenopathy:    No cervical adenopathy.  Neurological: Alert and oriented to person, place, and time. Exhibits normal muscle tone. Gait normal. Coordination normal.  Skin: Skin is warm and dry. No rash noted. Not diaphoretic. No erythema. No pallor.  Psychiatric: Mood, memory and judgment normal.  Vitals reviewed.  LABORATORY DATA: Lab Results  Component Value Date   WBC 4.1 10/10/2018   HGB 15.3 10/10/2018   HCT 43.6 10/10/2018   MCV 94.6 10/10/2018   PLT 210 10/10/2018      Chemistry      Component Value Date/Time   NA 135 10/10/2018 1019   NA 132 (L) 12/06/2017 0930   K 4.8 10/10/2018 1019   K 4.0 12/06/2017 0930   CL 101 10/10/2018 1019   CO2 27 10/10/2018 1019   CO2 26 12/06/2017 0930   BUN 17 10/10/2018 1019   BUN 15.4 12/06/2017 0930   CREATININE 0.87 10/10/2018 1019   CREATININE 0.9 12/06/2017 0930      Component Value Date/Time   CALCIUM 9.9  10/10/2018 1019   CALCIUM 9.9 12/06/2017 0930   ALKPHOS 98 10/10/2018 1019   ALKPHOS 107 12/06/2017 0930   AST 21 10/10/2018 1019   AST 20 12/06/2017 0930   ALT 13 10/10/2018 1019   ALT 14 12/06/2017 0930   BILITOT 1.0 10/10/2018 1019   BILITOT 0.76 12/06/2017 0930       RADIOGRAPHIC STUDIES:  Ct Chest W Contrast  Result Date: 10/08/2018 CLINICAL DATA:  Non-small-cell lung cancer. EXAM: CT CHEST, ABDOMEN, AND PELVIS WITH  CONTRAST TECHNIQUE: Multidetector CT imaging of the chest, abdomen and pelvis was performed following the standard protocol during bolus administration of intravenous contrast. CONTRAST:  166m OMNIPAQUE IOHEXOL 300 MG/ML  SOLN COMPARISON:  07/13/2018 FINDINGS: CT CHEST FINDINGS Cardiovascular: The heart size is normal. No substantial pericardial effusion. Coronary artery calcification is evident. Mediastinum/Nodes: No mediastinal lymphadenopathy. There is no hilar lymphadenopathy. The esophagus has normal imaging features. There is no axillary lymphadenopathy. Lungs/Pleura: The central tracheobronchial airways are patent. Centrilobular and paraseptal emphysema again noted. Bandlike scarring in the medial left upper lobe is stable. Posterior right upper lobe solid/sub solid nodule is stable, measuring 2.0 x 3.0 cm today compared to 2.0 x 2.9 cm previously. Multiple other scattered smaller sub solid nodules are similar to prior. No focal airspace consolidation. No pulmonary edema or pleural effusion. Musculoskeletal: Multiple bilateral nonacute rib fractures evident. Asymmetric bilateral gynecomastia again noted. CT ABDOMEN PELVIS FINDINGS Hepatobiliary: Tiny hypoattenuating lesion in the dome of the left liver is stable. Hypervascular focus in the dome of the right liver is unchanged. Low-density lesions inferior right liver are also stable, and likely cysts. Gallbladder surgically absent. No intrahepatic or extrahepatic biliary dilation. Pancreas: No focal mass lesion. No  dilatation of the main duct. No intraparenchymal cyst. No peripancreatic edema. Spleen: No splenomegaly. No focal mass lesion. Adrenals/Urinary Tract: No adrenal nodule or mass. Cortical scarring noted in both kidneys. No evidence for hydroureter. Bladder is distended with irregular wall and bladder diverticuli evident. The focal high density lesion in the posterior bladder seen on the previous study is not evident today. Stomach/Bowel: Stomach is nondistended. No gastric wall thickening. No evidence of outlet obstruction. Duodenum is normally positioned as is the ligament of Treitz. No small bowel wall thickening. No small bowel dilatation. The terminal ileum is normal. The appendix is normal. Diverticular changes are noted in the left colon without evidence of diverticulitis. Vascular/Lymphatic: There is abdominal aortic atherosclerosis without aneurysm. There is no gastrohepatic or hepatoduodenal ligament lymphadenopathy. No intraperitoneal or retroperitoneal lymphadenopathy. No pelvic sidewall lymphadenopathy. Reproductive: Heterogeneous enhancement of the prostate gland, nonspecific. Other: No intraperitoneal free fluid. Musculoskeletal: Stable small sclerotic focus left ischial tuberosity. 12 mm lucency in the right iliac crest (103/2) is stable. Marked degenerative changes noted in the lower thoracic and lumbar spine. IMPRESSION: 1. Stable exam without new or progressive interval findings. 2. Similar appearance of scattered bilateral sub solid/part solid pulmonary nodules. Continued surveillance recommended until 5 years of imaging stability has been documented. Annual CT follow-up for these findings could be used per consensus guidelines. 3.  Emphysema. (ICD10-J43.9) 4. Distended trabeculated bladder with bladder diverticuli evident. The focal density along the dependent bladder wall seen previously is not evident on today's exam. 5.  Aortic Atherosclerois (ICD10-170.0) Electronically Signed   By: EMisty StanleyM.D.   On: 10/08/2018 13:55   Ct Abdomen Pelvis W Contrast  Result Date: 10/08/2018 CLINICAL DATA:  Non-small-cell lung cancer. EXAM: CT CHEST, ABDOMEN, AND PELVIS WITH CONTRAST TECHNIQUE: Multidetector CT imaging of the chest, abdomen and pelvis was performed following the standard protocol during bolus administration of intravenous contrast. CONTRAST:  1052mOMNIPAQUE IOHEXOL 300 MG/ML  SOLN COMPARISON:  07/13/2018 FINDINGS: CT CHEST FINDINGS Cardiovascular: The heart size is normal. No substantial pericardial effusion. Coronary artery calcification is evident. Mediastinum/Nodes: No mediastinal lymphadenopathy. There is no hilar lymphadenopathy. The esophagus has normal imaging features. There is no axillary lymphadenopathy. Lungs/Pleura: The central tracheobronchial airways are patent. Centrilobular and paraseptal emphysema again noted. Bandlike scarring in the medial  left upper lobe is stable. Posterior right upper lobe solid/sub solid nodule is stable, measuring 2.0 x 3.0 cm today compared to 2.0 x 2.9 cm previously. Multiple other scattered smaller sub solid nodules are similar to prior. No focal airspace consolidation. No pulmonary edema or pleural effusion. Musculoskeletal: Multiple bilateral nonacute rib fractures evident. Asymmetric bilateral gynecomastia again noted. CT ABDOMEN PELVIS FINDINGS Hepatobiliary: Tiny hypoattenuating lesion in the dome of the left liver is stable. Hypervascular focus in the dome of the right liver is unchanged. Low-density lesions inferior right liver are also stable, and likely cysts. Gallbladder surgically absent. No intrahepatic or extrahepatic biliary dilation. Pancreas: No focal mass lesion. No dilatation of the main duct. No intraparenchymal cyst. No peripancreatic edema. Spleen: No splenomegaly. No focal mass lesion. Adrenals/Urinary Tract: No adrenal nodule or mass. Cortical scarring noted in both kidneys. No evidence for hydroureter. Bladder is distended  with irregular wall and bladder diverticuli evident. The focal high density lesion in the posterior bladder seen on the previous study is not evident today. Stomach/Bowel: Stomach is nondistended. No gastric wall thickening. No evidence of outlet obstruction. Duodenum is normally positioned as is the ligament of Treitz. No small bowel wall thickening. No small bowel dilatation. The terminal ileum is normal. The appendix is normal. Diverticular changes are noted in the left colon without evidence of diverticulitis. Vascular/Lymphatic: There is abdominal aortic atherosclerosis without aneurysm. There is no gastrohepatic or hepatoduodenal ligament lymphadenopathy. No intraperitoneal or retroperitoneal lymphadenopathy. No pelvic sidewall lymphadenopathy. Reproductive: Heterogeneous enhancement of the prostate gland, nonspecific. Other: No intraperitoneal free fluid. Musculoskeletal: Stable small sclerotic focus left ischial tuberosity. 12 mm lucency in the right iliac crest (103/2) is stable. Marked degenerative changes noted in the lower thoracic and lumbar spine. IMPRESSION: 1. Stable exam without new or progressive interval findings. 2. Similar appearance of scattered bilateral sub solid/part solid pulmonary nodules. Continued surveillance recommended until 5 years of imaging stability has been documented. Annual CT follow-up for these findings could be used per consensus guidelines. 3.  Emphysema. (ICD10-J43.9) 4. Distended trabeculated bladder with bladder diverticuli evident. The focal density along the dependent bladder wall seen previously is not evident on today's exam. 5.  Aortic Atherosclerois (ICD10-170.0) Electronically Signed   By: Misty Stanley M.D.   On: 10/08/2018 13:55     ASSESSMENT/PLAN:  Malignant neoplasm of lower lobe of right lung This is a very pleasant 77 year old white male with metastatic non-small cell lung cancer, adenocarcinoma and currently on treatment with second line  immunotherapy with Nivolumab status post 60 cycles and has been tolerating his treatment well. He was recently switched to Nivolumab 480 MG IV every 4 weeks status post 16 cycles. The patient continues to tolerate his treatment well with no concerning complaints.  He had a restaging CT scan of the chest, abdomen, pelvis and is here to discuss the results.  The patient was seen with Dr. [name].  CT scan results were discussed with the patient is wife which shows stable disease.  Recommend that he continue on Nivolumab 480 mg every 4 weeks.  He will proceed with cycle #17 today as scheduled.  He will follow-up in 4 weeks for evaluation prior to cycle #18.  For pain management, I have given him a refill on hydrocodone.  For sleep, I have given him a refill on Xanax.  The patient was advised to call immediately if he has any concerning symptoms in the interval. The patient voices understanding of current disease status and treatment options  and is in agreement with the current care plan. All questions were answered. The patient knows to call the clinic with any problems, questions or concerns. We can certainly see the patient much sooner if necessary.   No orders of the defined types were placed in this encounter.    [name], DNP, AGPCNP-BC, AOCNP 10/10/18   ADDENDUM: Hematology/Oncology Attending: I had a face-to-face encounter with the patient today.  I recommended his care plan.  This is a very pleasant 77 years old white male with metastatic non-small cell lung cancer, adenocarcinoma.  He is currently undergoing treatment with nivolumab 480 mg IV every 4 weeks status post 16 cycles of this treatment. The patient has been tolerating his treatment with no concerning adverse effects. His main complaints are the persistent fatigue and occasional dizzy spells that has been going on for years now. He had repeat CT scan of the chest, abdomen and pelvis performed recently. I personally and  independently reviewed the scans and discussed the results with the patient and his wife today. His a scan showed no concerning findings for disease progression. I recommended for the patient to continue his current treatment with Nivolumab and he will proceed with cycle #17 today as scheduled. I will see the patient back for follow-up visit in 4 weeks for evaluation before the next cycle of his treatment. He was advised to call immediately if he has any concerning symptoms in the interval.  Disclaimer: This note was dictated with voice recognition software. Similar sounding words can inadvertently be transcribed and may be missed upon review. Eilleen Kempf, MD 10/10/18

## 2018-10-12 DIAGNOSIS — H353221 Exudative age-related macular degeneration, left eye, with active choroidal neovascularization: Secondary | ICD-10-CM | POA: Diagnosis not present

## 2018-11-06 ENCOUNTER — Inpatient Hospital Stay (HOSPITAL_BASED_OUTPATIENT_CLINIC_OR_DEPARTMENT_OTHER): Payer: Medicare Other | Admitting: Internal Medicine

## 2018-11-06 ENCOUNTER — Encounter: Payer: Self-pay | Admitting: Internal Medicine

## 2018-11-06 ENCOUNTER — Inpatient Hospital Stay: Payer: Medicare Other

## 2018-11-06 ENCOUNTER — Inpatient Hospital Stay: Payer: Medicare Other | Attending: Internal Medicine

## 2018-11-06 ENCOUNTER — Telehealth: Payer: Self-pay | Admitting: Internal Medicine

## 2018-11-06 VITALS — BP 160/71 | HR 57 | Temp 98.5°F | Resp 20 | Ht 71.0 in | Wt 143.9 lb

## 2018-11-06 DIAGNOSIS — R52 Pain, unspecified: Secondary | ICD-10-CM

## 2018-11-06 DIAGNOSIS — C3431 Malignant neoplasm of lower lobe, right bronchus or lung: Secondary | ICD-10-CM | POA: Insufficient documentation

## 2018-11-06 DIAGNOSIS — G893 Neoplasm related pain (acute) (chronic): Secondary | ICD-10-CM

## 2018-11-06 DIAGNOSIS — Z5112 Encounter for antineoplastic immunotherapy: Secondary | ICD-10-CM

## 2018-11-06 DIAGNOSIS — F419 Anxiety disorder, unspecified: Secondary | ICD-10-CM

## 2018-11-06 DIAGNOSIS — C3490 Malignant neoplasm of unspecified part of unspecified bronchus or lung: Secondary | ICD-10-CM

## 2018-11-06 DIAGNOSIS — Z79899 Other long term (current) drug therapy: Secondary | ICD-10-CM | POA: Diagnosis not present

## 2018-11-06 DIAGNOSIS — R5383 Other fatigue: Secondary | ICD-10-CM

## 2018-11-06 DIAGNOSIS — R530 Neoplastic (malignant) related fatigue: Secondary | ICD-10-CM

## 2018-11-06 LAB — CMP (CANCER CENTER ONLY)
ALT: 14 U/L (ref 0–44)
AST: 24 U/L (ref 15–41)
Albumin: 4.1 g/dL (ref 3.5–5.0)
Alkaline Phosphatase: 102 U/L (ref 38–126)
Anion gap: 10 (ref 5–15)
BILIRUBIN TOTAL: 0.9 mg/dL (ref 0.3–1.2)
BUN: 14 mg/dL (ref 8–23)
CO2: 23 mmol/L (ref 22–32)
Calcium: 10 mg/dL (ref 8.9–10.3)
Chloride: 102 mmol/L (ref 98–111)
Creatinine: 0.92 mg/dL (ref 0.61–1.24)
Glucose, Bld: 95 mg/dL (ref 70–99)
POTASSIUM: 4.5 mmol/L (ref 3.5–5.1)
Sodium: 135 mmol/L (ref 135–145)
TOTAL PROTEIN: 6.6 g/dL (ref 6.5–8.1)

## 2018-11-06 LAB — CBC WITH DIFFERENTIAL (CANCER CENTER ONLY)
Abs Immature Granulocytes: 0.01 10*3/uL (ref 0.00–0.07)
BASOS PCT: 1 %
Basophils Absolute: 0 10*3/uL (ref 0.0–0.1)
EOS PCT: 10 %
Eosinophils Absolute: 0.4 10*3/uL (ref 0.0–0.5)
HCT: 43.5 % (ref 39.0–52.0)
Hemoglobin: 14.9 g/dL (ref 13.0–17.0)
Immature Granulocytes: 0 %
Lymphocytes Relative: 29 %
Lymphs Abs: 1.1 10*3/uL (ref 0.7–4.0)
MCH: 32 pg (ref 26.0–34.0)
MCHC: 34.3 g/dL (ref 30.0–36.0)
MCV: 93.5 fL (ref 80.0–100.0)
MONO ABS: 0.4 10*3/uL (ref 0.1–1.0)
Monocytes Relative: 12 %
NEUTROS ABS: 1.8 10*3/uL (ref 1.7–7.7)
NRBC: 0 % (ref 0.0–0.2)
Neutrophils Relative %: 48 %
PLATELETS: 207 10*3/uL (ref 150–400)
RBC: 4.65 MIL/uL (ref 4.22–5.81)
RDW: 13.4 % (ref 11.5–15.5)
WBC: 3.7 10*3/uL — AB (ref 4.0–10.5)

## 2018-11-06 LAB — TSH: TSH: 2.823 u[IU]/mL (ref 0.320–4.118)

## 2018-11-06 MED ORDER — HYDROCODONE-ACETAMINOPHEN 5-325 MG PO TABS: 1.0000 | ORAL_TABLET | Freq: Four times a day (QID) | ORAL | 0 refills | Status: DC | PRN

## 2018-11-06 MED ORDER — SODIUM CHLORIDE 0.9 % IV SOLN
480.0000 mg | Freq: Once | INTRAVENOUS | Status: AC
  Administered 2018-11-06: 480 mg via INTRAVENOUS
  Filled 2018-11-06: qty 48

## 2018-11-06 MED ORDER — SODIUM CHLORIDE 0.9 % IV SOLN
Freq: Once | INTRAVENOUS | Status: AC
  Administered 2018-11-06: 10:00:00 via INTRAVENOUS
  Filled 2018-11-06: qty 250

## 2018-11-06 MED ORDER — ALPRAZOLAM 0.25 MG PO TABS: 0.2500 mg | ORAL_TABLET | Freq: Two times a day (BID) | ORAL | 0 refills | Status: DC | PRN

## 2018-11-06 NOTE — Patient Instructions (Signed)
[facility] Discharge Instructions for Patients Receiving Chemotherapy  Today you received the following chemotherapy agent: Nivolumab   To help prevent nausea and vomiting after your treatment, we encourage you to take your nausea medication as directed  If you develop nausea and vomiting that is not controlled by your nausea medication, call the clinic.   BELOW ARE SYMPTOMS THAT SHOULD BE REPORTED IMMEDIATELY:  *FEVER GREATER THAN 100.5 F  *CHILLS WITH OR WITHOUT FEVER  NAUSEA AND VOMITING THAT IS NOT CONTROLLED WITH YOUR NAUSEA MEDICATION  *UNUSUAL SHORTNESS OF BREATH  *UNUSUAL BRUISING OR BLEEDING  TENDERNESS IN MOUTH AND THROAT WITH OR WITHOUT PRESENCE OF ULCERS  *URINARY PROBLEMS  *BOWEL PROBLEMS  UNUSUAL RASH Items with * indicate a potential emergency and should be followed up as soon as possible.  Feel free to call the clinic you have any questions or concerns. The clinic phone number is [phone].

## 2018-11-06 NOTE — Telephone Encounter (Signed)
Added another cycles per 12/3 los - pt to get an updated schedule next visit.

## 2018-11-06 NOTE — Progress Notes (Signed)
Nutrition Assessment   Reason for Assessment:  Patient identified on Malnutrition screening tool for weight loss   ASSESSMENT:  77 year old male with lung cancer followed by Dr. Earlie Server.  Past medical history of HTN, afib, GERD. Patient currently receiving second line therapy of nivolumab  Met with patient and wife during infusion today.  Patient reports that appetite is good.  "I eat when I am hungry. Sometimes I might eat 1 time per day or multiple times per day."  "I don't eat on a schedule."  Drinks ensure/boost shakes 2 times per day.  Denies any nutrition impact symptoms at this time.  Report that since last weight of 138 lb in November has really been trying to increase eating.  "That weight drop scared me."     Medications: Vit C, lasix, lactobaccillus, MVI, omega 3 fatty acids   Labs: reviewed   Anthropometrics:   Height: 71 inches Weight: 143 lb 14.4 oz today UBW: 145-155 lb per patient.  Noted 153 lb in Jan 2019 BMI: 19  Weight as low as 138 lb on 11/6, has increased since then   Estimated Energy Needs  Kcals: 1950-2200 calories/d Protein: 98-110 g/d Fluid: > 1.9L/d   NUTRITION DIAGNOSIS: Increased nutrient needs related to cancer as evidenced by weight loss initially but has regained weight   INTERVENTION:  Discussed importance of good nutrition and weight maintenance.   Encouraged patient to continue with small frequent meals including protein source.  "I have been trying to snack more on trail mix." Encouraged high calorie, high protein foods to prevent weight from decreasing.  Contact information given.   MONITORING, EVALUATION, GOAL: Patient will consume adequate calories and protein to maintain weight during treatment   Next Visit: December 31 during infusion  Hezekiah Veltre B. Zenia Resides, [place], [place] Registered Dietitian [phone] (pager)

## 2018-11-06 NOTE — Progress Notes (Signed)
[facility] [phone]   [phone]  OFFICE PROGRESS NOTE  [name], MD [place] [address]  DIAGNOSIS: Stage IV (T1a, N0, M1b) non-small cell lung cancer, adenocarcinoma of the right lower lobe diagnosed in October 2014  Molecular profile: Negative forRET, ALK, BRAF, MET, EGFR.  Positive for ERBB2 A775T, [id number], KRAS G13E, MAP2K1 D67N (see full report).   PRIOR THERAPY:  1) Systemic chemotherapy with carboplatin for AUC of 5 and Alimta 500 mg/M2 every 3 weeks, status post 6 cycles with stable disease. First dose on 10/23/2013. 2) Maintenance systemic chemotherapy with single agent Alimta 500 mg/M2 every 3 weeks. Status post 8 cycles.  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 Sondra Come completed on 12/16/2014. 4) stereotactic radiotherapy to the enlarging right upper lobe lung nodule under the care of Dr. Sondra Come completed on 11/22/2016. 5) Immunotherapy with Nivolumab 240 mg IV every 2 weeks, status post 60 cycles. First cycle was given on 01/28/2015. Last dose 07/05/2017.  CURRENT THERAPY: Immunotherapy with Nivolumab 480 mg IV every 4 weeks. First dose 07/19/2017. Status post 17 cycles.  INTERVAL HISTORY: Anthony Curry. 77 y.o. male returns to the clinic today for follow-up visit.  The patient is feeling fine today with no concerning complaints except for occasional fatigue.  He denied having any chest pain, shortness of breath, cough or hemoptysis.  He denied having any fever or chills.  He has no nausea, vomiting, diarrhea or constipation.  He has no significant weight loss or night sweats.  He has no headache or visual changes.  He is here today for evaluation before starting cycle #18.    MEDICAL HISTORY: Past Medical History:  Diagnosis Date  . 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[place])   . Adrenal mass ([place])   . Alcoholism in remission ([place])   . Anemia of chronic  disease   . 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 Anxiety   . BPH (benign prostatic hyperplasia)   . Cancer ([place])    small cell/lung  . Complication of anesthesia 09/08/2015    JITTERY AFTER GALLBLADDER SURGERY  . GERD (gastroesophageal reflux disease)   . History of radiation therapy 11/14/16, 11/18/16, 11/22/16   Left upper lung: 54 Gy in 3 fractions  . Hypertension   . Hypertension 12/07/2016  . Neoplastic malignant related fatigue    IV tx. every 2 weeks last9-14-16 (Dr. Earlie Server), radiation last tx. 6 months  . 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 Persistent atrial fibrillation   . Pulmonary nodules   . Radiation 12/02/14-12/16/14   Left adrenal gland 30 Gy in 10 fractions    ALLERGIES:  has No Known Allergies.  MEDICATIONS:  Current Outpatient Medications  Medication Sig Dispense Refill  . ALPRAZolam (XANAX) 0.25 MG tablet Take 1 tablet (0.25 mg total) by mouth 2 (two) times daily as needed for anxiety or sleep. 60 tablet 0  . amitriptyline (ELAVIL) 50 MG tablet Take 1 tablet (50 mg total) by mouth at bedtime. (Patient not taking: Reported on 10/10/2018) 90 tablet 3  . apixaban (ELIQUIS) 5 MG TABS tablet Take 5 mg by mouth 2 (two) times daily.    . Ascorbic Acid (VITAMIN C PO) Take 1,000 mg by mouth daily.     . B Complex-C (SUPER B COMPLEX PO) Take 1 each by mouth daily.     . calcium carbonate (OS-CAL) 600 MG TABS Take 600 mg by  mouth daily with breakfast.    . demeclocycline (DECLOMYCIN) 150 MG tablet Take 150 mg by mouth 2 (two) times daily.    [name] diltiazem (CARDIZEM CD) 180 MG 24 hr capsule Take 1 capsule (180 mg total) by mouth daily at 12 noon. 90 capsule 3  . furosemide (LASIX) 40 MG tablet Take 0.5 tablets (20 mg total) by mouth every other day.    [name] HYDROcodone-acetaminophen (NORCO/VICODIN) 5-325 MG tablet Take 1 tablet by mouth every 6 (six) hours as needed for moderate pain. 40 tablet 0  . KLOR-CON M20 20 MEQ tablet Take 20 mEq by mouth every  morning.     . Lactobacillus (ACIDOPHILUS) CAPS capsule Take 1 capsule by mouth daily.    [name] levothyroxine (SYNTHROID, LEVOTHROID) 50 MCG tablet TAKE 1 TABLET (50 MCG TOTAL) BY MOUTH DAILY BEFORE BREAKFAST. 90 tablet 0  . MAGNESIUM PO Take 400 mg by mouth daily.     . Multiple Vitamin (MULTIVITAMIN WITH MINERALS) TABS Take 1 tablet by mouth daily.    . Nivolumab (OPDIVO IV) Inject as directed every 2 weeks    . nivolumab 480 mg in sodium chloride 0.9 % 100 mL Inject as directed every 2 weeks    . Nutritional Supplements (ENSURE COMPLETE PO) Take 1 Can by mouth 2 (two) times daily.    . Omega-3 Fatty Acids (FISH OIL PO) Take 1 tablet by mouth daily.    . pantoprazole (PROTONIX) 40 MG tablet Take 40 mg by mouth every morning.  8  . tamsulosin (FLOMAX) 0.4 MG CAPS capsule Take 0.4 mg by mouth daily at 12 noon.     . thiamine (VITAMIN B-1) 100 MG tablet Take 1 tablet (100 mg total) by mouth daily. 30 tablet 6  . vitamin E 400 UNIT capsule Take 400 Units by mouth daily.     No current facility-administered medications for this visit.     SURGICAL HISTORY:  Past Surgical History:  Procedure Laterality Date  . ANKLE ARTHRODESIS  1995   rt fx-hardware in  . CARDIOVERSION N/A 09/29/2014   Procedure: CARDIOVERSION;  Surgeon: Josue Hector, MD;  Location: [facility] ENDOSCOPY;  Service: Cardiovascular;  Laterality: N/A;  . CHOLECYSTECTOMY  09/08/2015    laproscopic   . CHOLECYSTECTOMY N/A 09/08/2015   Procedure: LAPAROSCOPIC CHOLECYSTECTOMY WITH ATTEMPTED INTRAOPERATIVE CHOLANGIOGRAM ;  Surgeon: Fanny Skates, MD;  Location: [facility];  Service: General;  Laterality: N/A;  . COLONOSCOPY    . ERCP N/A 09/02/2015   Procedure: ENDOSCOPIC RETROGRADE CHOLANGIOPANCREATOGRAPHY (ERCP);  Surgeon: Arta Silence, MD;  Location: [facility] ENDOSCOPY;  Service: Endoscopy;  Laterality: N/A;  . ERCP N/A 09/07/2015   Procedure: ENDOSCOPIC RETROGRADE CHOLANGIOPANCREATOGRAPHY (ERCP);  Surgeon: Clarene Essex, MD;  Location: [facility] ENDOSCOPY;   Service: Endoscopy;  Laterality: N/A;  . EXCISION / CURETTAGE BONE CYST FINGER  2005   rt thumb  . HEMORRHOID SURGERY    . Percutaneous Cholecytostomy tube     IVR insertion 06-13-15(Dr. Vernard Gambles)- 08-28-15 remains in place to drainage bag  . TONSILLECTOMY    . TOTAL SHOULDER ARTHROPLASTY  02/03/2012   Procedure: TOTAL SHOULDER ARTHROPLASTY;  Surgeon: [name], MD;  Location: [facility];  Service: Orthopedics;  Laterality: Left;    REVIEW OF SYSTEMS:  A comprehensive review of systems was negative except for: Constitutional: positive for fatigue   PHYSICAL EXAMINATION: General appearance: alert, cooperative, fatigued and no distress Head: Normocephalic, without obvious abnormality, atraumatic Neck: no adenopathy, no JVD, supple, symmetrical, trachea midline and thyroid not enlarged,  symmetric, no tenderness/mass/nodules Lymph nodes: Cervical, supraclavicular, and axillary nodes normal. Resp: clear to auscultation bilaterally Back: symmetric, no curvature. ROM normal. No CVA tenderness. Cardio: regular rate and rhythm, S1, S2 normal, no murmur, click, rub or gallop GI: soft, non-tender; bowel sounds normal; no masses,  no organomegaly Extremities: extremities normal, atraumatic, no cyanosis or edema  ECOG PERFORMANCE STATUS: 1 - Symptomatic but completely ambulatory  Blood pressure (!) 160/71, pulse (!) 57, temperature 98.5 F (36.9 C), temperature source Oral, resp. rate 20, height _0  (1.803 m), weight 143 lb 14.4 oz (65.3 kg), SpO2 98 %.  LABORATORY DATA: Lab Results  Component Value Date   WBC 3.7 (L) 11/06/2018   HGB 14.9 11/06/2018   HCT 43.5 11/06/2018   MCV 93.5 11/06/2018   PLT 207 11/06/2018      Chemistry      Component Value Date/Time   NA 135 10/10/2018 1019   NA 132 (L) 12/06/2017 0930   K 4.8 10/10/2018 1019   K 4.0 12/06/2017 0930   CL 101 10/10/2018 1019   CO2 27 10/10/2018 1019   CO2 26 12/06/2017 0930   BUN 17 10/10/2018 1019    BUN 15.4 12/06/2017 0930   CREATININE 0.87 10/10/2018 1019   CREATININE 0.9 12/06/2017 0930      Component Value Date/Time   CALCIUM 9.9 10/10/2018 1019   CALCIUM 9.9 12/06/2017 0930   ALKPHOS 98 10/10/2018 1019   ALKPHOS 107 12/06/2017 0930   AST 21 10/10/2018 1019   AST 20 12/06/2017 0930   ALT 13 10/10/2018 1019   ALT 14 12/06/2017 0930   BILITOT 1.0 10/10/2018 1019   BILITOT 0.76 12/06/2017 0930       RADIOGRAPHIC STUDIES: Ct Chest W Contrast  Result Date: 10/08/2018 CLINICAL DATA:  Non-small-cell lung cancer. EXAM: CT CHEST, ABDOMEN, AND PELVIS WITH CONTRAST TECHNIQUE: Multidetector CT imaging of the chest, abdomen and pelvis was performed following the standard protocol during bolus administration of intravenous contrast. CONTRAST:  163m OMNIPAQUE IOHEXOL 300 MG/ML  SOLN COMPARISON:  07/13/2018 FINDINGS: CT CHEST FINDINGS Cardiovascular: The heart size is normal. No substantial pericardial effusion. Coronary artery calcification is evident. Mediastinum/Nodes: No mediastinal lymphadenopathy. There is no hilar lymphadenopathy. The esophagus has normal imaging features. There is no axillary lymphadenopathy. Lungs/Pleura: The central tracheobronchial airways are patent. Centrilobular and paraseptal emphysema again noted. Bandlike scarring in the medial left upper lobe is stable. Posterior right upper lobe solid/sub solid nodule is stable, measuring 2.0 x 3.0 cm today compared to 2.0 x 2.9 cm previously. Multiple other scattered smaller sub solid nodules are similar to prior. No focal airspace consolidation. No pulmonary edema or pleural effusion. Musculoskeletal: Multiple bilateral nonacute rib fractures evident. Asymmetric bilateral gynecomastia again noted. CT ABDOMEN PELVIS FINDINGS Hepatobiliary: Tiny hypoattenuating lesion in the dome of the left liver is stable. Hypervascular focus in the dome of the right liver is unchanged. Low-density lesions inferior right liver are also stable,  and likely cysts. Gallbladder surgically absent. No intrahepatic or extrahepatic biliary dilation. Pancreas: No focal mass lesion. No dilatation of the main duct. No intraparenchymal cyst. No peripancreatic edema. Spleen: No splenomegaly. No focal mass lesion. Adrenals/Urinary Tract: No adrenal nodule or mass. Cortical scarring noted in both kidneys. No evidence for hydroureter. Bladder is distended with irregular wall and bladder diverticuli evident. The focal high density lesion in the posterior bladder seen on the previous study is not evident today. Stomach/Bowel: Stomach is nondistended. No gastric wall thickening. No evidence of outlet obstruction. Duodenum  is normally positioned as is the ligament of Treitz. No small bowel wall thickening. No small bowel dilatation. The terminal ileum is normal. The appendix is normal. Diverticular changes are noted in the left colon without evidence of diverticulitis. Vascular/Lymphatic: There is abdominal aortic atherosclerosis without aneurysm. There is no gastrohepatic or hepatoduodenal ligament lymphadenopathy. No intraperitoneal or retroperitoneal lymphadenopathy. No pelvic sidewall lymphadenopathy. Reproductive: Heterogeneous enhancement of the prostate gland, nonspecific. Other: No intraperitoneal free fluid. Musculoskeletal: Stable small sclerotic focus left ischial tuberosity. 12 mm lucency in the right iliac crest (103/2) is stable. Marked degenerative changes noted in the lower thoracic and lumbar spine. IMPRESSION: 1. Stable exam without new or progressive interval findings. 2. Similar appearance of scattered bilateral sub solid/part solid pulmonary nodules. Continued surveillance recommended until 5 years of imaging stability has been documented. Annual CT follow-up for these findings could be used per consensus guidelines. 3.  Emphysema. (ICD10-J43.9) 4. Distended trabeculated bladder with bladder diverticuli evident. The focal density along the dependent  bladder wall seen previously is not evident on today's exam. 5.  Aortic Atherosclerois (ICD10-170.0) Electronically Signed   By: Misty Stanley M.D.   On: 10/08/2018 13:55   Ct Abdomen Pelvis W Contrast  Result Date: 10/08/2018 CLINICAL DATA:  Non-small-cell lung cancer. EXAM: CT CHEST, ABDOMEN, AND PELVIS WITH CONTRAST TECHNIQUE: Multidetector CT imaging of the chest, abdomen and pelvis was performed following the standard protocol during bolus administration of intravenous contrast. CONTRAST:  160m OMNIPAQUE IOHEXOL 300 MG/ML  SOLN COMPARISON:  07/13/2018 FINDINGS: CT CHEST FINDINGS Cardiovascular: The heart size is normal. No substantial pericardial effusion. Coronary artery calcification is evident. Mediastinum/Nodes: No mediastinal lymphadenopathy. There is no hilar lymphadenopathy. The esophagus has normal imaging features. There is no axillary lymphadenopathy. Lungs/Pleura: The central tracheobronchial airways are patent. Centrilobular and paraseptal emphysema again noted. Bandlike scarring in the medial left upper lobe is stable. Posterior right upper lobe solid/sub solid nodule is stable, measuring 2.0 x 3.0 cm today compared to 2.0 x 2.9 cm previously. Multiple other scattered smaller sub solid nodules are similar to prior. No focal airspace consolidation. No pulmonary edema or pleural effusion. Musculoskeletal: Multiple bilateral nonacute rib fractures evident. Asymmetric bilateral gynecomastia again noted. CT ABDOMEN PELVIS FINDINGS Hepatobiliary: Tiny hypoattenuating lesion in the dome of the left liver is stable. Hypervascular focus in the dome of the right liver is unchanged. Low-density lesions inferior right liver are also stable, and likely cysts. Gallbladder surgically absent. No intrahepatic or extrahepatic biliary dilation. Pancreas: No focal mass lesion. No dilatation of the main duct. No intraparenchymal cyst. No peripancreatic edema. Spleen: No splenomegaly. No focal mass lesion.  Adrenals/Urinary Tract: No adrenal nodule or mass. Cortical scarring noted in both kidneys. No evidence for hydroureter. Bladder is distended with irregular wall and bladder diverticuli evident. The focal high density lesion in the posterior bladder seen on the previous study is not evident today. Stomach/Bowel: Stomach is nondistended. No gastric wall thickening. No evidence of outlet obstruction. Duodenum is normally positioned as is the ligament of Treitz. No small bowel wall thickening. No small bowel dilatation. The terminal ileum is normal. The appendix is normal. Diverticular changes are noted in the left colon without evidence of diverticulitis. Vascular/Lymphatic: There is abdominal aortic atherosclerosis without aneurysm. There is no gastrohepatic or hepatoduodenal ligament lymphadenopathy. No intraperitoneal or retroperitoneal lymphadenopathy. No pelvic sidewall lymphadenopathy. Reproductive: Heterogeneous enhancement of the prostate gland, nonspecific. Other: No intraperitoneal free fluid. Musculoskeletal: Stable small sclerotic focus left ischial tuberosity. 12 mm lucency in the right iliac  crest (103/2) is stable. Marked degenerative changes noted in the lower thoracic and lumbar spine. IMPRESSION: 1. Stable exam without new or progressive interval findings. 2. Similar appearance of scattered bilateral sub solid/part solid pulmonary nodules. Continued surveillance recommended until 5 years of imaging stability has been documented. Annual CT follow-up for these findings could be used per consensus guidelines. 3.  Emphysema. (ICD10-J43.9) 4. Distended trabeculated bladder with bladder diverticuli evident. The focal density along the dependent bladder wall seen previously is not evident on today's exam. 5.  Aortic Atherosclerois (ICD10-170.0) Electronically Signed   By: Misty Stanley M.D.   On: 10/08/2018 13:55    ASSESSMENT AND PLAN:  This is a very pleasant 77 years old white male with metastatic  non-small cell lung cancer, adenocarcinoma and currently on treatment with second line immunotherapy with Nivolumab status post 60 cycles and has been tolerating his treatment well. He was recently switched to Nivolumab 480 MG IV every 4 weeks status post 17 cycles. He continues to tolerate this treatment well with no concerning adverse effects. I recommended for him to proceed with cycle #18 today as scheduled. I gave the patient refill of his pain medication with Vicodin as well as Xanax for anxiety. He was advised to call immediately if he has any concerning symptoms in the interval. The patient voices understanding of current disease status and treatment options and is in agreement with the current care plan. All questions were answered. The patient knows to call the clinic with any problems, questions or concerns. We can certainly see the patient much sooner if necessary.  Disclaimer: This note was dictated with voice recognition software. Similar sounding words can inadvertently be transcribed and may not be corrected upon review.

## 2018-11-13 ENCOUNTER — Other Ambulatory Visit: Payer: Self-pay | Admitting: Medical Oncology

## 2018-11-13 ENCOUNTER — Telehealth: Payer: Self-pay | Admitting: Medical Oncology

## 2018-11-13 DIAGNOSIS — G893 Neoplasm related pain (acute) (chronic): Secondary | ICD-10-CM

## 2018-11-13 MED ORDER — ALPRAZOLAM 0.25 MG PO TABS: 0.2500 mg | ORAL_TABLET | Freq: Two times a day (BID) | ORAL | 1 refills | Status: DC | PRN

## 2018-11-13 NOTE — Telephone Encounter (Signed)
[facility] does not have xanax 0.25 mg tablets. I called in rx to [facility]

## 2018-11-16 DIAGNOSIS — H353221 Exudative age-related macular degeneration, left eye, with active choroidal neovascularization: Secondary | ICD-10-CM | POA: Diagnosis not present

## 2018-11-26 ENCOUNTER — Telehealth: Payer: Self-pay | Admitting: Internal Medicine

## 2018-11-26 NOTE — Telephone Encounter (Signed)
MM PAL - moved 12/31 f/u from MM to [facility] (APP per MM). Adjusted lab. Spoke with patient. Possible treatment time adjustment pending.

## 2018-12-04 ENCOUNTER — Inpatient Hospital Stay: Payer: Medicare Other

## 2018-12-04 ENCOUNTER — Encounter: Payer: Self-pay | Admitting: Adult Health

## 2018-12-04 ENCOUNTER — Telehealth: Payer: Self-pay | Admitting: Adult Health

## 2018-12-04 ENCOUNTER — Inpatient Hospital Stay (HOSPITAL_BASED_OUTPATIENT_CLINIC_OR_DEPARTMENT_OTHER): Payer: Medicare Other | Admitting: Adult Health

## 2018-12-04 VITALS — BP 143/62 | HR 74 | Temp 97.6°F | Resp 18 | Ht 71.0 in | Wt 145.2 lb

## 2018-12-04 DIAGNOSIS — Z87891 Personal history of nicotine dependence: Secondary | ICD-10-CM | POA: Diagnosis not present

## 2018-12-04 DIAGNOSIS — G620 Drug-induced polyneuropathy: Secondary | ICD-10-CM | POA: Diagnosis not present

## 2018-12-04 DIAGNOSIS — G893 Neoplasm related pain (acute) (chronic): Secondary | ICD-10-CM

## 2018-12-04 DIAGNOSIS — Z5112 Encounter for antineoplastic immunotherapy: Secondary | ICD-10-CM | POA: Diagnosis not present

## 2018-12-04 DIAGNOSIS — Z79899 Other long term (current) drug therapy: Secondary | ICD-10-CM | POA: Diagnosis not present

## 2018-12-04 DIAGNOSIS — C3431 Malignant neoplasm of lower lobe, right bronchus or lung: Secondary | ICD-10-CM | POA: Diagnosis not present

## 2018-12-04 DIAGNOSIS — F419 Anxiety disorder, unspecified: Secondary | ICD-10-CM | POA: Diagnosis not present

## 2018-12-04 DIAGNOSIS — R530 Neoplastic (malignant) related fatigue: Secondary | ICD-10-CM

## 2018-12-04 DIAGNOSIS — R42 Dizziness and giddiness: Secondary | ICD-10-CM | POA: Diagnosis not present

## 2018-12-04 DIAGNOSIS — C3490 Malignant neoplasm of unspecified part of unspecified bronchus or lung: Secondary | ICD-10-CM

## 2018-12-04 DIAGNOSIS — G479 Sleep disorder, unspecified: Secondary | ICD-10-CM

## 2018-12-04 LAB — CBC WITH DIFFERENTIAL (CANCER CENTER ONLY)
Abs Immature Granulocytes: 0.01 10*3/uL (ref 0.00–0.07)
BASOS PCT: 1 %
Basophils Absolute: 0 10*3/uL (ref 0.0–0.1)
Eosinophils Absolute: 0.2 10*3/uL (ref 0.0–0.5)
Eosinophils Relative: 5 %
HEMATOCRIT: 42 % (ref 39.0–52.0)
HEMOGLOBIN: 14.4 g/dL (ref 13.0–17.0)
Immature Granulocytes: 0 %
LYMPHS ABS: 0.9 10*3/uL (ref 0.7–4.0)
LYMPHS PCT: 21 %
MCH: 32.2 pg (ref 26.0–34.0)
MCHC: 34.3 g/dL (ref 30.0–36.0)
MCV: 94 fL (ref 80.0–100.0)
MONOS PCT: 11 %
Monocytes Absolute: 0.5 10*3/uL (ref 0.1–1.0)
NEUTROS ABS: 2.7 10*3/uL (ref 1.7–7.7)
Neutrophils Relative %: 62 %
PLATELETS: 212 10*3/uL (ref 150–400)
RBC: 4.47 MIL/uL (ref 4.22–5.81)
RDW: 13.6 % (ref 11.5–15.5)
WBC Count: 4.4 10*3/uL (ref 4.0–10.5)
nRBC: 0 % (ref 0.0–0.2)

## 2018-12-04 LAB — CMP (CANCER CENTER ONLY)
ALBUMIN: 3.9 g/dL (ref 3.5–5.0)
ALT: 13 U/L (ref 0–44)
ANION GAP: 11 (ref 5–15)
AST: 22 U/L (ref 15–41)
Alkaline Phosphatase: 114 U/L (ref 38–126)
BILIRUBIN TOTAL: 1 mg/dL (ref 0.3–1.2)
BUN: 17 mg/dL (ref 8–23)
CO2: 24 mmol/L (ref 22–32)
Calcium: 9.5 mg/dL (ref 8.9–10.3)
Chloride: 101 mmol/L (ref 98–111)
Creatinine: 0.94 mg/dL (ref 0.61–1.24)
GFR, Est AFR Am: >60 mL/min (ref >60)
GFR, Estimated: >60 mL/min (ref >60)
GLUCOSE: 90 mg/dL (ref 70–99)
POTASSIUM: 4.1 mmol/L (ref 3.5–5.1)
SODIUM: 136 mmol/L (ref 135–145)
TOTAL PROTEIN: 6.4 g/dL — AB (ref 6.5–8.1)

## 2018-12-04 LAB — TSH: TSH: 2.593 u[IU]/mL (ref 0.320–4.118)

## 2018-12-04 MED ORDER — ALPRAZOLAM 0.25 MG PO TABS: 0.2500 mg | ORAL_TABLET | Freq: Two times a day (BID) | ORAL | 1 refills | Status: DC | PRN

## 2018-12-04 MED ORDER — SODIUM CHLORIDE 0.9 % IV SOLN
Freq: Once | INTRAVENOUS | Status: AC
  Administered 2018-12-04: 12:00:00 via INTRAVENOUS
  Filled 2018-12-04: qty 250

## 2018-12-04 MED ORDER — SODIUM CHLORIDE 0.9 % IV SOLN
480.0000 mg | Freq: Once | INTRAVENOUS | Status: AC
  Administered 2018-12-04: 480 mg via INTRAVENOUS
  Filled 2018-12-04: qty 48

## 2018-12-04 MED ORDER — HYDROCODONE-ACETAMINOPHEN 5-325 MG PO TABS: 1.0000 | ORAL_TABLET | Freq: Four times a day (QID) | ORAL | 0 refills | Status: DC | PRN

## 2018-12-04 NOTE — Progress Notes (Signed)
Nutrition  RD planning to meet with patient during infusion today but patient infusion completed before RD was able to speak with patient.  Will plan follow-up on 2/18 during next infusion.  Anthony Curry, Anthony Curry, Anthony Curry Registered Dietitian [phone] (pager)

## 2018-12-04 NOTE — Patient Instructions (Signed)
[facility] Discharge Instructions for Patients Receiving Chemotherapy  Today you received the following chemotherapy agent: Nivolumab   To help prevent nausea and vomiting after your treatment, we encourage you to take your nausea medication as directed  If you develop nausea and vomiting that is not controlled by your nausea medication, call the clinic.   BELOW ARE SYMPTOMS THAT SHOULD BE REPORTED IMMEDIATELY:  *FEVER GREATER THAN 100.5 F  *CHILLS WITH OR WITHOUT FEVER  NAUSEA AND VOMITING THAT IS NOT CONTROLLED WITH YOUR NAUSEA MEDICATION  *UNUSUAL SHORTNESS OF BREATH  *UNUSUAL BRUISING OR BLEEDING  TENDERNESS IN MOUTH AND THROAT WITH OR WITHOUT PRESENCE OF ULCERS  *URINARY PROBLEMS  *BOWEL PROBLEMS  UNUSUAL RASH Items with * indicate a potential emergency and should be followed up as soon as possible.  Feel free to call the clinic you have any questions or concerns. The clinic phone number is [phone].

## 2018-12-04 NOTE — Progress Notes (Signed)
Anthony Curry Follow up:    Anthony Dus, MD [address] [place] [address]   DIAGNOSIS: Curry Staging Lung Curry [facility]) Staging form: Lung, AJCC 7th Edition - Clinical: Stage IV (T1a, N0, M1b) - Signed by Anthony Bears, MD on 10/15/2013 - Pathologic: No stage assigned - Unsigned  Molecular profile: Negative forRET, ALK, BRAF, MET, EGFR.  Positive for ERBB2 A775T, XFG1W299B, KRAS G13E, MAP2K1 D67N (see full report).    SUMMARY OF ONCOLOGIC HISTORY: 1) Systemic chemotherapy with carboplatin for AUC of 5 and Alimta 500 mg/M2 every 3 weeks, status post 6 cycles with stable disease. First dose on 10/23/2013. 2) Maintenance systemic chemotherapy with single agent Alimta 500 mg/M2 every 3 weeks. Status post 8 cycles.  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 Sondra Curry completed on 12/16/2014. 4) stereotactic radiotherapy to the enlarging right upper lobe lung nodule under the care of Dr. Sondra Curry completed on 11/22/2016. 5) Immunotherapy with Nivolumab 240 mg IV every 2 weeks, status post 60 cycles. First cycle was given on 01/28/2015. Last dose 07/05/2017.  CURRENT THERAPY:Nivolumab every 4 weeks  INTERVAL HISTORY: Anthony Curry. 77 y.o. male returns for evaluation prior to receiving treatment with Nivolumab.  He has some dizziness that is stable.  This has been going on since he completed chemotherapy.  This is not worsening.  He is requesting refill of his Vicodin and Xanax.  He takes Vicodin for his peripheral neruopathy.  It is worse at night.  He typically takes the Vicodin at that time, and doesn't need it for anything else.  The Vicodin is working for his pain.  He also takes Xanax at night to help him sleep.  He typically takes 1-2 as needed at night.    His last CT chest was on 10/08/2018 that was stable and showed no new or progressive interval findings.  He is going out of town from 1/25  through 01/19/18.  He wants to know if he can get his treatment on 1/24 instead of 1/29.     Patient Active Problem List   Diagnosis Date Noted  . Hypertension 12/07/2016  . 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 Acute urinary retention 09/09/2015  . Choledocholithiasis with cholecystitis 09/08/2015  . Common bile duct stone   . Encounter for antineoplastic immunotherapy 06/28/2015  . Atrial fibrillation (Anthony Curry) 06/11/2015  . Gynecomastia, male 06/10/2015  . Dizziness 05/11/2015  . Malignant neoplasm of lower lobe of right lung (Anthony Curry) 09/17/2014  . Neutropenic fever (Anthony Curry) 09/02/2014  . Neoplastic malignant related fatigue 08/13/2014  . Physical deconditioning 08/13/2014  . Hypoalbuminemia 08/13/2014  . Diarrhea 08/13/2014  . Antineoplastic chemotherapy induced anemia(285.3) 08/13/2014  . Atrial fibrillation with RVR (Anthony Curry) 07/28/2014  . HCAP (healthcare-associated pneumonia) 07/25/2014  . 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 Atrial flutter by electrocardiogram (Anthony Curry) 07/25/2014  . Swelling of limb 06/10/2014  . Lung Curry (Anthony Curry) 10/15/2013  . Hypotension 09/10/2013  . Adrenal mass (Anthony Curry) 09/09/2013  . 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 Pulmonary nodules 08/19/2013  . Anemia of chronic disease 07/03/2013  . BPH (benign prostatic hyperplasia) 07/03/2013  . GERD (gastroesophageal reflux disease) 07/03/2013  . Fall 07/03/2013  . Alcohol intoxication (Anthony Curry) 07/03/2013  . 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 Alcohol abuse 10/15/2011  . Hyponatremia 10/15/2011    has No Known Allergies.  MEDICAL HISTORY: Past Medical History:  Diagnosis Date  . 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[place])   . Adrenal  mass (Anthony Curry)   . Alcoholism in remission (Anthony Curry)   . Anemia of chronic disease   . 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 Anxiety   . BPH (benign prostatic hyperplasia)   . Curry (Anthony Curry)    small cell/lung  . Complication of anesthesia 09/08/2015     JITTERY AFTER GALLBLADDER SURGERY  . GERD (gastroesophageal reflux disease)   . History of radiation therapy 11/14/16, 11/18/16, 11/22/16   Left upper lung: 54 Gy in 3 fractions  . Hypertension   . Hypertension 12/07/2016  . Neoplastic malignant related fatigue    IV tx. every 2 weeks last9-14-16 (Dr. Earlie Server), radiation last tx. 6 months  . 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 Persistent atrial fibrillation   . Pulmonary nodules   . Radiation 12/02/14-12/16/14   Left adrenal gland 30 Gy in 10 fractions    SURGICAL HISTORY: Past Surgical History:  Procedure Laterality Date  . ANKLE ARTHRODESIS  1995   rt fx-hardware in  . CARDIOVERSION N/A 09/29/2014   Procedure: CARDIOVERSION;  Surgeon: Josue Hector, MD;  Location: [facility] ENDOSCOPY;  Service: Cardiovascular;  Laterality: N/A;  . CHOLECYSTECTOMY  09/08/2015    laproscopic   . CHOLECYSTECTOMY N/A 09/08/2015   Procedure: LAPAROSCOPIC CHOLECYSTECTOMY WITH ATTEMPTED INTRAOPERATIVE CHOLANGIOGRAM ;  Surgeon: Fanny Skates, MD;  Location: [facility];  Service: General;  Laterality: N/A;  . COLONOSCOPY    . ERCP N/A 09/02/2015   Procedure: ENDOSCOPIC RETROGRADE CHOLANGIOPANCREATOGRAPHY (ERCP);  Surgeon: Arta Silence, MD;  Location: [facility] END[facility];  Service: Endoscopy;  Laterality: N/A;  . ERCP N/A 09/07/2015   Procedure: ENDOSCOPIC RETROGRADE CHOLANGIOPANCREATOGRAPHY (ERCP);  Surgeon: Clarene Essex, MD;  Location: [facility] ENDOSCOPY;  Service: Endoscopy;  Laterality: N/A;  . EXCISION / CURETTAGE BONE CYST FINGER  2005   rt thumb  . HEMORRHOID SURGERY    . Percutaneous Cholecytostomy tube     IVR insertion 06-13-15(Dr. Vernard Gambles)- 08-28-15 remains in place to drainage bag  . TONSILLECTOMY    . TOTAL SHOULDER ARTHROPLASTY  02/03/2012   Procedure: TOTAL SHOULDER ARTHROPLASTY;  Surgeon: [name], MD;  Location: [facility];  Service: Orthopedics;  Laterality: Left;    SOCIAL HISTORY: Social History   Socioeconomic History  . Marital  status: Married    Spouse name: Danis Pembleton  . Number of children: 3  . Years of education: 27  . Highest education level: Not on file  Occupational History  . 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 Financial resource strain: Not on file  . Food insecurity:    Worry: Not on file    Inability: Not on file  . Transportation needs:    Medical: Not on file    Non-medical: Not on file  Tobacco Use  . Smoking status: Former Smoker    Packs/day: 2.00    Years: 30.00    Pack years: 60.00    Types: Cigarettes    Last attempt to quit: 12/05/1990    Years since quitting: 28.0  . Smokeless tobacco: Never Used  Substance and Sexual Activity  . Alcohol use: Yes    Alcohol/week: 0.0 standard drinks    Comment: past hx.ETOH abuse,08-31-15 "occ. wine ,drinks non alcoholic beer occ."  . Drug use: No  . Sexual activity: Not Currently  Lifestyle  . Physical activity:    Days per week: Not on file    Minutes per session: Not on file  . Stress: Not on  file  Relationships  . Social connections:    Talks on phone: Not on file    Gets together: Not on file    Attends religious service: Not on file    Active member of club or organization: Not on file    Attends meetings of clubs or organizations: Not on file    Relationship status: Not on file  . Intimate partner violence:    Fear of current or ex partner: Not on file    Emotionally abused: Not on file    Physically abused: Not on file    Forced sexual activity: Not on file  Other Topics Concern  . Not on file  Social History Narrative   Lives at home with wife.   Caffeine use: Drinks coffee- 2 cups per day   Retired from [employer]: Family History  Problem Relation Age of Onset  . Lung Curry Mother   . Hypertension Father   . Heart attack Father   . Diabetes Paternal Grandmother   . Brain Curry Brother   . Heart attack Brother   . Neuropathy Neg Hx      Review of Systems  Constitutional: Positive for fatigue. Negative for appetite change, chills, fever and unexpected weight change.  HENT:   Negative for hearing loss, lump/mass and trouble swallowing.   Eyes: Negative for eye problems and icterus.  Respiratory: Negative for chest tightness, cough and shortness of breath.   Cardiovascular: Negative for chest pain, leg swelling and palpitations.  Gastrointestinal: Negative for abdominal distention, abdominal pain, constipation, diarrhea, nausea and vomiting.  Endocrine: Negative for hot flashes.  Musculoskeletal: Negative for arthralgias.  Skin: Negative for itching and rash.  Neurological: Positive for dizziness. Negative for extremity weakness, headaches and numbness.  Hematological: Negative for adenopathy. Does not bruise/bleed easily.  Psychiatric/Behavioral: Positive for sleep disturbance. Negative for depression. The patient is not nervous/anxious.       PHYSICAL EXAMINATION  ECOG PERFORMANCE STATUS: 1 - Symptomatic but completely ambulatory  Vitals:   12/04/18 1114  BP: (!) 143/62  Pulse: 74  Resp: 18  Temp: 97.6 F (36.4 C)  SpO2: 100%    Physical Exam Constitutional:      Appearance: Normal appearance.  HENT:     Head: Normocephalic and atraumatic.     Nose: No congestion.     Mouth/Throat:     Mouth: Mucous membranes are moist.     Pharynx: Oropharynx is clear. No oropharyngeal exudate.  Eyes:     General: No scleral icterus.    Pupils: Pupils are equal, round, and reactive to light.  Neck:     Musculoskeletal: Neck supple.  Cardiovascular:     Rate and Rhythm: Normal rate and regular rhythm.     Heart sounds: Normal heart sounds.  Pulmonary:     Effort: Pulmonary effort is normal.     Breath sounds: Normal breath sounds.  Abdominal:     General: Abdomen is flat. Bowel sounds are normal.     Palpations: Abdomen is soft.  Musculoskeletal:        General: No swelling.  Skin:    General: Skin is  warm.     Capillary Refill: Capillary refill takes less than 2 seconds.     Findings: No rash.  Neurological:     General: No focal deficit present.     Mental Status: He is alert.  Psychiatric:        Mood and Affect: Mood normal.  Behavior: Behavior normal.     LABORATORY DATA:  CBC    Component Value Date/Time   WBC 4.4 12/04/2018 1004   WBC 4.5 05/23/2018 1025   RBC 4.47 12/04/2018 1004   HGB 14.4 12/04/2018 1004   HGB 14.0 12/06/2017 0930   HCT 42.0 12/04/2018 1004   HCT 39.8 12/06/2017 0930   PLT 212 12/04/2018 1004   PLT 230 12/06/2017 0930   MCV 94.0 12/04/2018 1004   MCV 92.8 12/06/2017 0930   MCH 32.2 12/04/2018 1004   MCHC 34.3 12/04/2018 1004   RDW 13.6 12/04/2018 1004   RDW 14.1 12/06/2017 0930   LYMPHSABS 0.9 12/04/2018 1004   LYMPHSABS 1.0 12/06/2017 0930   MONOABS 0.5 12/04/2018 1004   MONOABS 0.9 12/06/2017 0930   EOSABS 0.2 12/04/2018 1004   EOSABS 0.3 12/06/2017 0930   BASOSABS 0.0 12/04/2018 1004   BASOSABS 0.0 12/06/2017 0930    CMP     Component Value Date/Time   NA 136 12/04/2018 1004   NA 132 (L) 12/06/2017 0930   K 4.1 12/04/2018 1004   K 4.0 12/06/2017 0930   CL 101 12/04/2018 1004   CO2 24 12/04/2018 1004   CO2 26 12/06/2017 0930   GLUCOSE 90 12/04/2018 1004   GLUCOSE 89 12/06/2017 0930   BUN 17 12/04/2018 1004   BUN 15.4 12/06/2017 0930   CREATININE 0.94 12/04/2018 1004   CREATININE 0.9 12/06/2017 0930   CALCIUM 9.5 12/04/2018 1004   CALCIUM 9.9 12/06/2017 0930   PROT 6.4 (L) 12/04/2018 1004   PROT 6.3 (L) 12/06/2017 0930   ALBUMIN 3.9 12/04/2018 1004   ALBUMIN 3.7 12/06/2017 0930   AST 22 12/04/2018 1004   AST 20 12/06/2017 0930   ALT 13 12/04/2018 1004   ALT 14 12/06/2017 0930   ALKPHOS 114 12/04/2018 1004   ALKPHOS 107 12/06/2017 0930   BILITOT 1.0 12/04/2018 1004   BILITOT 0.76 12/06/2017 0930   GFRNONAA >60 12/04/2018 1004   GFRAA >60 12/04/2018 1004      ASSESSMENT and PLAN:   Lung Curry Anthony Curry  is a 77 year old man with stage IV lung Curry here today for evaluation prior to receiving Nivolumab.  He receives this every 4 weeks and continues to tolerate it well.  1. Lung Curry: labs stable.  No clinical signs of progression.  Last CT chest in 10/2018.  Will be due for repeat in February to March, 2020.  He will proceed with Nivolumab today.  I reviewed his request for schedule change with Dr. [name].  He said it was too early to change his schedule and that he could skip a treatment and receive the nivolumab when he returned.  2. Peripheral neuropathy: controlled with vicodin.  Refilled Vicodin today.  PMP aware reviewed.  Necessity of controlled substance reviewed with Dr. [name], his primary oncologist.  3. Sleep pattern disturbance: Improved with Xanax, refilled today.  PMP aware reviewed, no red flags.  4. Dizziness: chronic and stable.     All questions were answered. The patient knows to call the clinic with any problems, questions or concerns. We can certainly see the patient much sooner if necessary.  A total of (30) minutes of face-to-face time was spent with this patient with greater than 50% of that time in counseling and care-coordination.  This note was electronically signed. Scot Dock, NP 12/04/2018

## 2018-12-04 NOTE — Assessment & Plan Note (Signed)
Rudra is a 77 year old man with stage IV lung cancer here today for evaluation prior to receiving Nivolumab.  He receives this every 4 weeks and continues to tolerate it well.  1. Lung cancer: labs stable.  No clinical signs of progression.  Last CT chest in 10/2018.  Will be due for repeat in February to March, 2020.  He will proceed with Nivolumab today.  I reviewed his request for schedule change with Dr. [name].  He said it was too early to change his schedule and that he could skip a treatment and receive the nivolumab when he returned.  2. Peripheral neuropathy: controlled with vicodin.  Refilled Vicodin today.  PMP aware reviewed.  Necessity of controlled substance reviewed with Dr. [name], his primary oncologist.  3. Sleep pattern disturbance: Improved with Xanax, refilled today.  PMP aware reviewed, no red flags.  4. Dizziness: chronic and stable.

## 2018-12-04 NOTE — Telephone Encounter (Signed)
Printed calendar and avs. °

## 2018-12-20 ENCOUNTER — Telehealth: Payer: Self-pay | Admitting: *Deleted

## 2018-12-20 NOTE — Telephone Encounter (Signed)
Pt called requesting refill on Hydrocodone and xanax. Pt will be leaving for [place] 1/27-2/10. Pt advised he can come in for treatment the week of 2/11 instead of week of 2/18 if needed. Pt requesting call back with additional information. Discussed with pt MD out of office, will return on Monday and to expect a call back.

## 2018-12-21 DIAGNOSIS — H353221 Exudative age-related macular degeneration, left eye, with active choroidal neovascularization: Secondary | ICD-10-CM | POA: Diagnosis not present

## 2018-12-22 ENCOUNTER — Other Ambulatory Visit: Payer: Self-pay | Admitting: Internal Medicine

## 2018-12-22 DIAGNOSIS — G893 Neoplasm related pain (acute) (chronic): Secondary | ICD-10-CM

## 2018-12-22 MED ORDER — HYDROCODONE-ACETAMINOPHEN 5-325 MG PO TABS: 1.0000 | ORAL_TABLET | Freq: Four times a day (QID) | ORAL | 0 refills | Status: DC | PRN

## 2018-12-22 MED ORDER — ALPRAZOLAM 0.25 MG PO TABS: 0.2500 mg | ORAL_TABLET | Freq: Two times a day (BID) | ORAL | 1 refills | Status: DC | PRN

## 2018-12-24 ENCOUNTER — Telehealth: Payer: Self-pay | Admitting: Medical Oncology

## 2018-12-24 ENCOUNTER — Other Ambulatory Visit: Payer: Self-pay | Admitting: Internal Medicine

## 2018-12-24 DIAGNOSIS — G893 Neoplasm related pain (acute) (chronic): Secondary | ICD-10-CM

## 2018-12-24 MED ORDER — ALPRAZOLAM 0.25 MG PO TABS: 0.2500 mg | ORAL_TABLET | Freq: Two times a day (BID) | ORAL | 1 refills | Status: DC | PRN

## 2018-12-24 MED ORDER — HYDROCODONE-ACETAMINOPHEN 5-325 MG PO TABS: 1.0000 | ORAL_TABLET | Freq: Four times a day (QID) | ORAL | 0 refills | Status: DC | PRN

## 2018-12-24 NOTE — Telephone Encounter (Signed)
Cancelled Hydrocodone and xanax at [facility]. Mohamed notified to resend to [facility].

## 2019-01-02 ENCOUNTER — Other Ambulatory Visit: Payer: Medicare Other

## 2019-01-02 ENCOUNTER — Ambulatory Visit: Payer: Medicare Other | Admitting: Internal Medicine

## 2019-01-02 ENCOUNTER — Ambulatory Visit: Payer: Medicare Other | Admitting: Oncology

## 2019-01-02 ENCOUNTER — Ambulatory Visit: Payer: Medicare Other

## 2019-01-18 DIAGNOSIS — S52501A Unspecified fracture of the lower end of right radius, initial encounter for closed fracture: Secondary | ICD-10-CM | POA: Diagnosis not present

## 2019-01-22 ENCOUNTER — Telehealth: Payer: Self-pay | Admitting: Internal Medicine

## 2019-01-22 ENCOUNTER — Inpatient Hospital Stay: Payer: Medicare Other

## 2019-01-22 ENCOUNTER — Encounter: Payer: Self-pay | Admitting: Internal Medicine

## 2019-01-22 ENCOUNTER — Inpatient Hospital Stay (HOSPITAL_BASED_OUTPATIENT_CLINIC_OR_DEPARTMENT_OTHER): Payer: Medicare Other | Admitting: Internal Medicine

## 2019-01-22 ENCOUNTER — Inpatient Hospital Stay: Payer: Medicare Other | Attending: Internal Medicine

## 2019-01-22 VITALS — BP 172/74 | HR 78 | Temp 97.8°F | Resp 20 | Ht 71.0 in | Wt 142.6 lb

## 2019-01-22 DIAGNOSIS — C3431 Malignant neoplasm of lower lobe, right bronchus or lung: Secondary | ICD-10-CM | POA: Insufficient documentation

## 2019-01-22 DIAGNOSIS — Z5112 Encounter for antineoplastic immunotherapy: Secondary | ICD-10-CM

## 2019-01-22 DIAGNOSIS — Z79899 Other long term (current) drug therapy: Secondary | ICD-10-CM | POA: Diagnosis not present

## 2019-01-22 DIAGNOSIS — Z9181 History of falling: Secondary | ICD-10-CM | POA: Diagnosis not present

## 2019-01-22 DIAGNOSIS — I10 Essential (primary) hypertension: Secondary | ICD-10-CM | POA: Diagnosis not present

## 2019-01-22 DIAGNOSIS — C3411 Malignant neoplasm of upper lobe, right bronchus or lung: Secondary | ICD-10-CM

## 2019-01-22 DIAGNOSIS — G893 Neoplasm related pain (acute) (chronic): Secondary | ICD-10-CM

## 2019-01-22 DIAGNOSIS — R52 Pain, unspecified: Secondary | ICD-10-CM

## 2019-01-22 DIAGNOSIS — R530 Neoplastic (malignant) related fatigue: Secondary | ICD-10-CM

## 2019-01-22 DIAGNOSIS — C3490 Malignant neoplasm of unspecified part of unspecified bronchus or lung: Secondary | ICD-10-CM

## 2019-01-22 LAB — TSH: TSH: 2.175 u[IU]/mL (ref 0.320–4.118)

## 2019-01-22 LAB — CMP (CANCER CENTER ONLY)
ALT: 10 U/L (ref 0–44)
AST: 16 U/L (ref 15–41)
Albumin: 3.9 g/dL (ref 3.5–5.0)
Alkaline Phosphatase: 132 U/L — ABNORMAL HIGH (ref 38–126)
Anion gap: 9 (ref 5–15)
BUN: 16 mg/dL (ref 8–23)
CO2: 26 mmol/L (ref 22–32)
Calcium: 9.5 mg/dL (ref 8.9–10.3)
Chloride: 99 mmol/L (ref 98–111)
Creatinine: 0.78 mg/dL (ref 0.61–1.24)
GFR, Estimated: >60 mL/min (ref >60)
Glucose, Bld: 106 mg/dL — ABNORMAL HIGH (ref 70–99)
Potassium: 4 mmol/L (ref 3.5–5.1)
Sodium: 134 mmol/L — ABNORMAL LOW (ref 135–145)
TOTAL PROTEIN: 6.4 g/dL — AB (ref 6.5–8.1)
Total Bilirubin: 0.7 mg/dL (ref 0.3–1.2)

## 2019-01-22 LAB — CBC WITH DIFFERENTIAL (CANCER CENTER ONLY)
Abs Immature Granulocytes: 0.01 10*3/uL (ref 0.00–0.07)
BASOS PCT: 0 %
Basophils Absolute: 0 10*3/uL (ref 0.0–0.1)
Eosinophils Absolute: 0.2 10*3/uL (ref 0.0–0.5)
Eosinophils Relative: 5 %
HCT: 40.4 % (ref 39.0–52.0)
Hemoglobin: 14.1 g/dL (ref 13.0–17.0)
Immature Granulocytes: 0 %
Lymphocytes Relative: 20 %
Lymphs Abs: 0.9 10*3/uL (ref 0.7–4.0)
MCH: 32.4 pg (ref 26.0–34.0)
MCHC: 34.9 g/dL (ref 30.0–36.0)
MCV: 92.9 fL (ref 80.0–100.0)
Monocytes Absolute: 0.6 10*3/uL (ref 0.1–1.0)
Monocytes Relative: 13 %
Neutro Abs: 2.7 10*3/uL (ref 1.7–7.7)
Neutrophils Relative %: 62 %
PLATELETS: 280 10*3/uL (ref 150–400)
RBC: 4.35 MIL/uL (ref 4.22–5.81)
RDW: 13.8 % (ref 11.5–15.5)
WBC Count: 4.5 10*3/uL (ref 4.0–10.5)
nRBC: 0 % (ref 0.0–0.2)

## 2019-01-22 MED ORDER — SODIUM CHLORIDE 0.9 % IV SOLN
Freq: Once | INTRAVENOUS | Status: AC
  Administered 2019-01-22: 15:00:00 via INTRAVENOUS
  Filled 2019-01-22: qty 250

## 2019-01-22 MED ORDER — SODIUM CHLORIDE 0.9 % IV SOLN
480.0000 mg | Freq: Once | INTRAVENOUS | Status: AC
  Administered 2019-01-22: 480 mg via INTRAVENOUS
  Filled 2019-01-22: qty 48

## 2019-01-22 MED ORDER — HYDROCODONE-ACETAMINOPHEN 5-325 MG PO TABS: 1.0000 | ORAL_TABLET | Freq: Four times a day (QID) | ORAL | 0 refills | Status: DC | PRN

## 2019-01-22 NOTE — Patient Instructions (Signed)
[facility] Discharge Instructions for Patients Receiving Chemotherapy  Today you received the following chemotherapy agent: Nivolumab   To help prevent nausea and vomiting after your treatment, we encourage you to take your nausea medication as directed  If you develop nausea and vomiting that is not controlled by your nausea medication, call the clinic.   BELOW ARE SYMPTOMS THAT SHOULD BE REPORTED IMMEDIATELY:  *FEVER GREATER THAN 100.5 F  *CHILLS WITH OR WITHOUT FEVER  NAUSEA AND VOMITING THAT IS NOT CONTROLLED WITH YOUR NAUSEA MEDICATION  *UNUSUAL SHORTNESS OF BREATH  *UNUSUAL BRUISING OR BLEEDING  TENDERNESS IN MOUTH AND THROAT WITH OR WITHOUT PRESENCE OF ULCERS  *URINARY PROBLEMS  *BOWEL PROBLEMS  UNUSUAL RASH Items with * indicate a potential emergency and should be followed up as soon as possible.  Feel free to call the clinic you have any questions or concerns. The clinic phone number is [phone].

## 2019-01-22 NOTE — Progress Notes (Signed)
Nutrition Follow-up:  Patient with lung cancer followed by Dr. Earlie Server.  Patient currently receiving nivolumab.  Met with patient during infusion.  Reports that appetite is up and down. "It is hard to eat when you are not hungry."  Continues to drink ensure/boost about 2 times per day.  Likes to snack on trail mix for extra calories and protein.    Noted patient with cast on arm. Went on trip and fell getting up to the bathroom.     Patient denies any nutrition impact symptoms.   Medications: reviewed  Labs: reviewed  Anthropometrics:   Weight 142 lb 9.6 oz today stable from last weight of 143 lb 14.4 oz on 12/3   NUTRITION DIAGNOSIS: Increased nutrient needs continue   INTERVENTION:  Encouraged patient to continue oral nutrition supplements at least 2 per day Encouraged high calorie, high protein snacks for added nutrition    MONITORING, EVALUATION, GOAL: Patient will consume adequate calories and protein to maintain weight during treatment   NEXT VISIT: March 17 during infusion  Kaitlin Alcindor B. Zenia Resides, [place], [place] Registered Dietitian [phone] (pager)

## 2019-01-22 NOTE — Telephone Encounter (Signed)
Scheduled appt per 2/18 los.  Printed avs and gave the patient the number to central radiology.

## 2019-01-22 NOTE — Progress Notes (Signed)
[place] [phone]   [phone]  OFFICE PROGRESS NOTE  [name], MD [name] [id number]  DIAGNOSIS: Stage IV (T1a, N0, M1b) non-small cell lung cancer, adenocarcinoma of the right lower lobe diagnosed in October 2014  Molecular profile: Negative forRET, ALK, BRAF, MET, EGFR.  Positive for ERBB2 A775T, [id number], KRAS G13E, MAP2K1 D67N (see full report).   PRIOR THERAPY:  1) Systemic chemotherapy with carboplatin for AUC of 5 and Alimta 500 mg/M2 every 3 weeks, status post 6 cycles with stable disease. First dose on 10/23/2013. 2) Maintenance systemic chemotherapy with single agent Alimta 500 mg/M2 every 3 weeks. Status post 8 cycles.  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 Sondra Come completed on 12/16/2014. 4) stereotactic radiotherapy to the enlarging right upper lobe lung nodule under the care of Dr. Sondra Come completed on 11/22/2016. 5) Immunotherapy with Nivolumab 240 mg IV every 2 weeks, status post 60 cycles. First cycle was given on 01/28/2015. Last dose 07/05/2017.  CURRENT THERAPY: Immunotherapy with Nivolumab 480 mg IV every 4 weeks. First dose 07/19/2017. Status post 19 cycles.  INTERVAL HISTORY: Anthony Curry. 78 y.o. male returns to the clinic today for follow-up visit accompanied by his wife.  The patient was on vacation in [place] when he slipped on the floor and has a fall and broke his right forearm.  He denied having any current chest pain, shortness of breath, cough or hemoptysis.  He denied having any fever or chills.  He has no nausea, vomiting, diarrhea or constipation.  He denied having any headache or visual changes.  He is here today for evaluation before starting cycle #20 of his treatment with nivolumab.  MEDICAL HISTORY: Past Medical History:  Diagnosis Date  . 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[place])   . Adrenal mass ([place])   . Alcoholism in  remission ([place])   . Anemia of chronic disease   . 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 Anxiety   . BPH (benign prostatic hyperplasia)   . Cancer ([place])    small cell/lung  . Complication of anesthesia 09/08/2015    JITTERY AFTER GALLBLADDER SURGERY  . GERD (gastroesophageal reflux disease)   . History of radiation therapy 11/14/16, 11/18/16, 11/22/16   Left upper lung: 54 Gy in 3 fractions  . Hypertension   . Hypertension 12/07/2016  . Neoplastic malignant related fatigue    IV tx. every 2 weeks last9-14-16 (Dr. Earlie Server), radiation last tx. 6 months  . 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 Persistent atrial fibrillation   . Pulmonary nodules   . Radiation 12/02/14-12/16/14   Left adrenal gland 30 Gy in 10 fractions    ALLERGIES:  has No Known Allergies.  MEDICATIONS:  Current Outpatient Medications  Medication Sig Dispense Refill  . ALPRAZolam (XANAX) 0.25 MG tablet Take 1 tablet (0.25 mg total) by mouth 2 (two) times daily as needed for anxiety or sleep. 60 tablet 1  . apixaban (ELIQUIS) 5 MG TABS tablet Take 5 mg by mouth 2 (two) times daily.    . Ascorbic Acid (VITAMIN C PO) Take 1,000 mg by mouth daily.     . B Complex-C (SUPER B COMPLEX PO) Take 1 each by mouth daily.     . calcium carbonate (OS-CAL) 600 MG TABS Take 600 mg by mouth daily with breakfast.    . demeclocycline (DECLOMYCIN) 150 MG tablet Take 150 mg by mouth  2 (two) times daily.    [name] diltiazem (CARDIZEM CD) 180 MG 24 hr capsule Take 1 capsule (180 mg total) by mouth daily at 12 noon. 90 capsule 3  . furosemide (LASIX) 40 MG tablet Take 0.5 tablets (20 mg total) by mouth every other day.    [name] HYDROcodone-acetaminophen (NORCO/VICODIN) 5-325 MG tablet Take 1 tablet by mouth every 6 (six) hours as needed for moderate pain. 40 tablet 0  . KLOR-CON M20 20 MEQ tablet Take 20 mEq by mouth every morning.     . Lactobacillus (ACIDOPHILUS) CAPS capsule Take 1 capsule by mouth daily.    [name] levothyroxine  (SYNTHROID, LEVOTHROID) 50 MCG tablet TAKE 1 TABLET (50 MCG TOTAL) BY MOUTH DAILY BEFORE BREAKFAST. 90 tablet 0  . MAGNESIUM PO Take 400 mg by mouth daily.     . Multiple Vitamin (MULTIVITAMIN WITH MINERALS) TABS Take 1 tablet by mouth daily.    . Nivolumab (OPDIVO IV) Inject as directed every 2 weeks    . nivolumab 480 mg in sodium chloride 0.9 % 100 mL Patient states receives this only once a month, not Q 2 weeks.    . Nutritional Supplements (ENSURE COMPLETE PO) Take 1 Can by mouth 2 (two) times daily.    . Omega-3 Fatty Acids (FISH OIL PO) Take 1 tablet by mouth daily.    . pantoprazole (PROTONIX) 40 MG tablet Take 40 mg by mouth every morning.  8  . tamsulosin (FLOMAX) 0.4 MG CAPS capsule Take 0.4 mg by mouth daily at 12 noon.     . thiamine (VITAMIN B-1) 100 MG tablet Take 1 tablet (100 mg total) by mouth daily. 30 tablet 6  . vitamin E 400 UNIT capsule Take 400 Units by mouth daily.     No current facility-administered medications for this visit.     SURGICAL HISTORY:  Past Surgical History:  Procedure Laterality Date  . ANKLE ARTHRODESIS  1995   rt fx-hardware in  . CARDIOVERSION N/A 09/29/2014   Procedure: CARDIOVERSION;  Surgeon: Josue Hector, MD;  Location: [facility] ENDOSCOPY;  Service: Cardiovascular;  Laterality: N/A;  . CHOLECYSTECTOMY  09/08/2015    laproscopic   . CHOLECYSTECTOMY N/A 09/08/2015   Procedure: LAPAROSCOPIC CHOLECYSTECTOMY WITH ATTEMPTED INTRAOPERATIVE CHOLANGIOGRAM ;  Surgeon: Fanny Skates, MD;  Location: [facility];  Service: General;  Laterality: N/A;  . COLONOSCOPY    . ERCP N/A 09/02/2015   Procedure: ENDOSCOPIC RETROGRADE CHOLANGIOPANCREATOGRAPHY (ERCP);  Surgeon: Arta Silence, MD;  Location: [facility];  Service: Endoscopy;  Laterality: N/A;  . ERCP N/A 09/07/2015   Procedure: ENDOSCOPIC RETROGRADE CHOLANGIOPANCREATOGRAPHY (ERCP);  Surgeon: Clarene Essex, MD;  Location: [facility] ENDOS[facility];  Service: Endoscopy;  Laterality: N/A;  . EXCISION / CURETTAGE BONE CYST  FINGER  2005   rt thumb  . HEMORRHOID SURGERY    . Percutaneous Cholecytostomy tube     IVR insertion 06-13-15(Dr. Vernard Gambles)- 08-28-15 remains in place to drainage bag  . TONSILLECTOMY    . TOTAL SHOULDER ARTHROPLASTY  02/03/2012   Procedure: TOTAL SHOULDER ARTHROPLASTY;  Surgeon: [name], MD;  Location: [facility];  Service: Orthopedics;  Laterality: Left;    REVIEW OF SYSTEMS:  A comprehensive review of systems was negative except for: Constitutional: positive for fatigue   PHYSICAL EXAMINATION: General appearance: alert, cooperative, fatigued and no distress Head: Normocephalic, without obvious abnormality, atraumatic Neck: no adenopathy, no JVD, supple, symmetrical, trachea midline and thyroid not enlarged, symmetric, no tenderness/mass/nodules Lymph nodes: Cervical, supraclavicular, and axillary nodes normal. Resp:  clear to auscultation bilaterally Back: symmetric, no curvature. ROM normal. No CVA tenderness. Cardio: regular rate and rhythm, S1, S2 normal, no murmur, click, rub or gallop GI: soft, non-tender; bowel sounds normal; no masses,  no organomegaly Extremities: extremities normal, atraumatic, no cyanosis or edema  ECOG PERFORMANCE STATUS: 1 - Symptomatic but completely ambulatory  Blood pressure (!) 172/74, pulse 78, temperature 97.8 F (36.6 C), temperature source Oral, resp. rate 20, height _0  (1.803 m), weight 142 lb 9.6 oz (64.7 kg), SpO2 100 %.  LABORATORY DATA: Lab Results  Component Value Date   WBC 4.5 01/22/2019   HGB 14.1 01/22/2019   HCT 40.4 01/22/2019   MCV 92.9 01/22/2019   PLT 280 01/22/2019      Chemistry      Component Value Date/Time   NA 136 12/04/2018 1004   NA 132 (L) 12/06/2017 0930   K 4.1 12/04/2018 1004   K 4.0 12/06/2017 0930   CL 101 12/04/2018 1004   CO2 24 12/04/2018 1004   CO2 26 12/06/2017 0930   BUN 17 12/04/2018 1004   BUN 15.4 12/06/2017 0930   CREATININE 0.94 12/04/2018 1004   CREATININE 0.9  12/06/2017 0930      Component Value Date/Time   CALCIUM 9.5 12/04/2018 1004   CALCIUM 9.9 12/06/2017 0930   ALKPHOS 114 12/04/2018 1004   ALKPHOS 107 12/06/2017 0930   AST 22 12/04/2018 1004   AST 20 12/06/2017 0930   ALT 13 12/04/2018 1004   ALT 14 12/06/2017 0930   BILITOT 1.0 12/04/2018 1004   BILITOT 0.76 12/06/2017 0930       RADIOGRAPHIC STUDIES: No results found.  ASSESSMENT AND PLAN:  This is a very pleasant 78 years old white male with metastatic non-small cell lung cancer, adenocarcinoma and currently on treatment with second line immunotherapy with Nivolumab status post 60 cycles and has been tolerating his treatment well. He was recently switched to Nivolumab 480 MG IV every 4 weeks status post 19 cycles. He tolerated the last cycle of his treatment well with no concerning adverse effects. I recommended for the patient to proceed with cycle #20 today as scheduled. For hypertension he was advised to take his blood pressure medication as prescribed and to monitor it closely at home. For pain management I will give him a refill of hydrocodone. The patient will come back for follow-up visit in 4 weeks for evaluation after repeating CT scan of the chest, abdomen and pelvis for restaging of his disease. He was advised to call immediately if he has any concerning symptoms in the interval. The patient voices understanding of current disease status and treatment options and is in agreement with the current care plan. All questions were answered. The patient knows to call the clinic with any problems, questions or concerns. We can certainly see the patient much sooner if necessary.  Disclaimer: This note was dictated with voice recognition software. Similar sounding words can inadvertently be transcribed and may not be corrected upon review.

## 2019-01-25 DIAGNOSIS — M25512 Pain in left shoulder: Secondary | ICD-10-CM | POA: Diagnosis not present

## 2019-01-25 DIAGNOSIS — S52501D Unspecified fracture of the lower end of right radius, subsequent encounter for closed fracture with routine healing: Secondary | ICD-10-CM | POA: Diagnosis not present

## 2019-01-29 ENCOUNTER — Other Ambulatory Visit: Payer: Medicare Other

## 2019-01-29 ENCOUNTER — Ambulatory Visit: Payer: Medicare Other

## 2019-01-29 ENCOUNTER — Ambulatory Visit: Payer: Medicare Other | Admitting: Internal Medicine

## 2019-02-13 DIAGNOSIS — H353221 Exudative age-related macular degeneration, left eye, with active choroidal neovascularization: Secondary | ICD-10-CM | POA: Diagnosis not present

## 2019-02-13 DIAGNOSIS — H353112 Nonexudative age-related macular degeneration, right eye, intermediate dry stage: Secondary | ICD-10-CM | POA: Diagnosis not present

## 2019-02-15 DIAGNOSIS — S52501D Unspecified fracture of the lower end of right radius, subsequent encounter for closed fracture with routine healing: Secondary | ICD-10-CM | POA: Diagnosis not present

## 2019-02-18 ENCOUNTER — Other Ambulatory Visit: Payer: Self-pay

## 2019-02-18 ENCOUNTER — Ambulatory Visit (HOSPITAL_COMMUNITY)
Admission: RE | Admit: 2019-02-18 | Discharge: 2019-02-18 | Disposition: A | Discharge status: Home / Self Care | Payer: Medicare Other | Source: Ambulatory Visit | Attending: Internal Medicine | Admitting: Internal Medicine

## 2019-02-18 ENCOUNTER — Encounter (HOSPITAL_COMMUNITY): Payer: Self-pay

## 2019-02-18 ENCOUNTER — Other Ambulatory Visit: Payer: Self-pay | Admitting: Medical Oncology

## 2019-02-18 DIAGNOSIS — C3431 Malignant neoplasm of lower lobe, right bronchus or lung: Secondary | ICD-10-CM

## 2019-02-18 DIAGNOSIS — R918 Other nonspecific abnormal finding of lung field: Secondary | ICD-10-CM | POA: Diagnosis not present

## 2019-02-18 DIAGNOSIS — C3490 Malignant neoplasm of unspecified part of unspecified bronchus or lung: Secondary | ICD-10-CM | POA: Insufficient documentation

## 2019-02-18 DIAGNOSIS — K5790 Diverticulosis of intestine, part unspecified, without perforation or abscess without bleeding: Secondary | ICD-10-CM | POA: Diagnosis not present

## 2019-02-18 MED ORDER — IOHEXOL 300 MG/ML  SOLN
100.0000 mL | Freq: Once | INTRAMUSCULAR | Status: AC | PRN
  Administered 2019-02-18: 100 mL via INTRAVENOUS

## 2019-02-18 MED ORDER — SODIUM CHLORIDE (PF) 0.9 % IJ SOLN
INTRAMUSCULAR | Status: AC
  Filled 2019-02-18: qty 50

## 2019-02-19 ENCOUNTER — Other Ambulatory Visit: Payer: Self-pay

## 2019-02-19 ENCOUNTER — Encounter: Payer: Self-pay | Admitting: Internal Medicine

## 2019-02-19 ENCOUNTER — Inpatient Hospital Stay (HOSPITAL_BASED_OUTPATIENT_CLINIC_OR_DEPARTMENT_OTHER): Payer: Medicare Other | Admitting: Internal Medicine

## 2019-02-19 ENCOUNTER — Inpatient Hospital Stay: Payer: Medicare Other

## 2019-02-19 ENCOUNTER — Inpatient Hospital Stay: Payer: Medicare Other | Attending: Internal Medicine

## 2019-02-19 VITALS — BP 160/73 | HR 68 | Temp 97.8°F | Resp 20 | Ht 71.0 in | Wt 142.8 lb

## 2019-02-19 DIAGNOSIS — C3431 Malignant neoplasm of lower lobe, right bronchus or lung: Secondary | ICD-10-CM | POA: Diagnosis not present

## 2019-02-19 DIAGNOSIS — R52 Pain, unspecified: Secondary | ICD-10-CM

## 2019-02-19 DIAGNOSIS — Z5112 Encounter for antineoplastic immunotherapy: Secondary | ICD-10-CM | POA: Diagnosis not present

## 2019-02-19 DIAGNOSIS — I10 Essential (primary) hypertension: Secondary | ICD-10-CM

## 2019-02-19 DIAGNOSIS — C3490 Malignant neoplasm of unspecified part of unspecified bronchus or lung: Secondary | ICD-10-CM

## 2019-02-19 DIAGNOSIS — G893 Neoplasm related pain (acute) (chronic): Secondary | ICD-10-CM

## 2019-02-19 DIAGNOSIS — R5383 Other fatigue: Secondary | ICD-10-CM | POA: Diagnosis not present

## 2019-02-19 LAB — CMP (CANCER CENTER ONLY)
ALT: 14 U/L (ref 0–44)
ANION GAP: 10 (ref 5–15)
AST: 21 U/L (ref 15–41)
Albumin: 3.9 g/dL (ref 3.5–5.0)
Alkaline Phosphatase: 99 U/L (ref 38–126)
BUN: 19 mg/dL (ref 8–23)
CO2: 24 mmol/L (ref 22–32)
Calcium: 9.1 mg/dL (ref 8.9–10.3)
Chloride: 100 mmol/L (ref 98–111)
Creatinine: 0.82 mg/dL (ref 0.61–1.24)
GFR, Est AFR Am: >60 mL/min (ref >60)
GFR, Estimated: >60 mL/min (ref >60)
Glucose, Bld: 96 mg/dL (ref 70–99)
Potassium: 3.9 mmol/L (ref 3.5–5.1)
Sodium: 134 mmol/L — ABNORMAL LOW (ref 135–145)
Total Bilirubin: 0.9 mg/dL (ref 0.3–1.2)
Total Protein: 6.3 g/dL — ABNORMAL LOW (ref 6.5–8.1)

## 2019-02-19 LAB — CBC WITH DIFFERENTIAL (CANCER CENTER ONLY)
Abs Immature Granulocytes: 0.01 10*3/uL (ref 0.00–0.07)
Basophils Absolute: 0 10*3/uL (ref 0.0–0.1)
Basophils Relative: 1 %
Eosinophils Absolute: 0.3 10*3/uL (ref 0.0–0.5)
Eosinophils Relative: 9 %
HCT: 41.7 % (ref 39.0–52.0)
Hemoglobin: 14.3 g/dL (ref 13.0–17.0)
Immature Granulocytes: 0 %
Lymphocytes Relative: 31 %
Lymphs Abs: 1.2 10*3/uL (ref 0.7–4.0)
MCH: 32.2 pg (ref 26.0–34.0)
MCHC: 34.3 g/dL (ref 30.0–36.0)
MCV: 93.9 fL (ref 80.0–100.0)
Monocytes Absolute: 0.5 10*3/uL (ref 0.1–1.0)
Monocytes Relative: 14 %
NRBC: 0 % (ref 0.0–0.2)
Neutro Abs: 1.8 10*3/uL (ref 1.7–7.7)
Neutrophils Relative %: 45 %
Platelet Count: 220 10*3/uL (ref 150–400)
RBC: 4.44 MIL/uL (ref 4.22–5.81)
RDW: 13.6 % (ref 11.5–15.5)
WBC Count: 3.9 10*3/uL — ABNORMAL LOW (ref 4.0–10.5)

## 2019-02-19 MED ORDER — SODIUM CHLORIDE 0.9 % IV SOLN
Freq: Once | INTRAVENOUS | Status: AC
  Administered 2019-02-19: 12:00:00 via INTRAVENOUS
  Filled 2019-02-19: qty 250

## 2019-02-19 MED ORDER — SODIUM CHLORIDE 0.9 % IV SOLN
480.0000 mg | Freq: Once | INTRAVENOUS | Status: AC
  Administered 2019-02-19: 480 mg via INTRAVENOUS
  Filled 2019-02-19: qty 48

## 2019-02-19 MED ORDER — HYDROCODONE-ACETAMINOPHEN 5-325 MG PO TABS: 1.0000 | ORAL_TABLET | Freq: Four times a day (QID) | ORAL | 0 refills | Status: DC | PRN

## 2019-02-19 NOTE — Progress Notes (Signed)
Nutrition Follow-up:   Patient with lung cancer followed by Dr. Earlie Server.  Patient currently receiving nivolumab.  Met with patient during infusion.  Reports appetite is the same.  "I eat when I am hungry and don't eat when I am not."  Continues to drink ensure 2 times daily.  Wife recently bought high protein shake.  Continues to snack on trail mix.  Eating soup and sandwich for lunch during infusion.    Medications: reviewed  Labs: reviewed  Anthropometrics:   Weight 142 lb 12.8 oz stable from last visit on 01/22/18.    NUTRITION DIAGNOSIS: Increased nutrient needs continue   INTERVENTION:  Reviewed oral nutrition shakes high in calories.  Provided patient with case of ensure enlive and coupons.      MONITORING, EVALUATION, GOAL: Patient will consume adequate calories and protein to maintain weight   NEXT VISIT: as needed  Jaythen Hamme B. Zenia Resides, [place], [place] Registered Dietitian [phone] (pager)

## 2019-02-19 NOTE — Progress Notes (Signed)
[place] [phone]   [phone]  OFFICE PROGRESS NOTE  [name], MD [place] [address]  DIAGNOSIS: Stage IV (T1a, N0, M1b) non-small cell lung cancer, adenocarcinoma of the right lower lobe diagnosed in October 2014  Molecular profile: Negative forRET, ALK, BRAF, MET, EGFR.  Positive for ERBB2 A775T, [id number], KRAS G13E, MAP2K1 D67N (see full report).   PRIOR THERAPY:  1) Systemic chemotherapy with carboplatin for AUC of 5 and Alimta 500 mg/M2 every 3 weeks, status post 6 cycles with stable disease. First dose on 10/23/2013. 2) Maintenance systemic chemotherapy with single agent Alimta 500 mg/M2 every 3 weeks. Status post 8 cycles.  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 Sondra Come completed on 12/16/2014. 4) stereotactic radiotherapy to the enlarging right upper lobe lung nodule under the care of Dr. Sondra Come completed on 11/22/2016. 5) Immunotherapy with Nivolumab 240 mg IV every 2 weeks, status post 60 cycles. First cycle was given on 01/28/2015. Last dose 07/05/2017.  CURRENT THERAPY: Immunotherapy with Nivolumab 480 mg IV every 4 weeks. First dose 07/19/2017. Status post 20 cycles.  INTERVAL HISTORY: Anthony Curry. 78 y.o. male returns to the clinic today for follow-up visit accompanied by his wife.  The patient is feeling fine today with no concerning complaints except for the fatigue.  He denied having any current chest pain, shortness of breath, cough or hemoptysis.  He denied having any recent weight loss or night sweats.  He has no nausea, vomiting, diarrhea or constipation.  He denied having any skin rash or itching.  He has been tolerating his treatment with immunotherapy fairly well.  The patient had repeat CT scan of the chest, abdomen and pelvis performed recently and he is here for evaluation and discussion of his scan results.  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 every 2 weeks last9-14-16 (Dr. Earlie Server), radiation last tx. 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  MEDICATIONS:  Current Outpatient Medications  Medication Sig Dispense Refill   ALPRAZolam (XANAX) 0.25 MG tablet Take 1 tablet (0.25 mg total) by mouth 2 (two) times daily as needed for anxiety or sleep. 60 tablet 1   apixaban (ELIQUIS) 5 MG TABS tablet Take 5 mg by mouth 2 (two) times daily.     Ascorbic Acid (VITAMIN C PO) Take 1,000 mg by mouth daily.      B Complex-C (SUPER B COMPLEX PO) Take 1 each by mouth daily.      calcium carbonate (OS-CAL) 600 MG TABS Take 600 mg by mouth daily  with breakfast.     demeclocycline (DECLOMYCIN) 150 MG tablet Take 150 mg by mouth 2 (two) times daily.     diltiazem (CARDIZEM CD) 180 MG 24 hr capsule Take 1 capsule (180 mg total) by mouth daily at 12 noon. 90 capsule 3   furosemide (LASIX) 40 MG tablet Take 0.5 tablets (20 mg total) by mouth every other day.     HYDROcodone-acetaminophen (NORCO/VICODIN) 5-325 MG tablet Take 1 tablet by mouth every 6 (six) hours as needed for moderate pain. 40 tablet 0   KLOR-CON M20 20 MEQ tablet Take 20 mEq  by mouth every morning.      Lactobacillus (ACIDOPHILUS) CAPS capsule Take 1 capsule by mouth daily.     levothyroxine (SYNTHROID, LEVOTHROID) 50 MCG tablet TAKE 1 TABLET (50 MCG TOTAL) BY MOUTH DAILY BEFORE BREAKFAST. 90 tablet 0   MAGNESIUM PO Take 400 mg by mouth daily.      Multiple Vitamin (MULTIVITAMIN WITH MINERALS) TABS Take 1 tablet by mouth daily.     Nivolumab (OPDIVO IV) Inject as directed every 2 weeks     nivolumab 480 mg in sodium chloride 0.9 % 100 mL Patient states receives this only once a month, not Q 2 weeks.     Nutritional Supplements (ENSURE COMPLETE PO) Take 1 Can by mouth 2 (two) times daily.     Omega-3 Fatty Acids (FISH OIL PO) Take 1 tablet by mouth daily.     pantoprazole (PROTONIX) 40 MG tablet Take 40 mg by mouth every morning.  8   tamsulosin (FLOMAX) 0.4 MG CAPS capsule Take 0.4 mg by mouth daily at 12 noon.      thiamine (VITAMIN B-1) 100 MG tablet Take 1 tablet (100 mg total) by mouth daily. 30 tablet 6   vitamin E 400 UNIT capsule Take 400 Units by mouth daily.     No current facility-administered medications for this visit.     SURGICAL HISTORY:  Past Surgical History:  Procedure Laterality Date   ANKLE ARTHRODESIS  1995   rt fx-hardware in   CARDIOVERSION N/A 09/29/2014   Procedure: CARDIOVERSION;  Surgeon: Josue Hector, MD;  [facility] ENDOSCOPY;  Service: Cardiovascular;  Laterality: N/A;   CHOLECYSTECTOMY  09/08/2015    laproscopic    CHOLECYSTECTOMY N/A 09/08/2015   Procedure: LAPAROSCOPIC CHOLECYSTECTOMY WITH ATTEMPTED INTRAOPERATIVE CHOLANGIOGRAM ;  Surgeon: Fanny Skates, MD;  Location: [facility];  Service: General;  Laterality: N/A;   COLONOSCOPY     ERCP N/A 09/02/2015   Procedure: ENDOSCOPIC RETROGRADE CHOLANGIOPANCREATOGRAPHY (ERCP);  Surgeon: Arta Silence, MD;  Location: [facility] ENDOSCOPY;  Service: Endoscopy;  Laterality: N/A;   ERCP N/A 09/07/2015   Procedure: ENDOSCOPIC RETROGRADE CHOLANGIOPANCREATOGRAPHY (ERCP);   Surgeon: Clarene Essex, MD;  Location: [facility] ENDOSCOPY;  Service: Endoscopy;  Laterality: N/A;   EXCISION / CURETTAGE BONE CYST FINGER  2005   rt thumb   HEMORRHOID SURGERY     Percutaneous Cholecytostomy tube     IVR insertion 06-13-15(Dr. Vernard Gambles)- 08-28-15 remains in place to drainage bag   TONSILLECTOMY     TOTAL SHOULDER ARTHROPLASTY  02/03/2012   Procedure: TOTAL SHOULDER ARTHROPLASTY;  Surgeon: [name], MD;  Location: [place];  Service: Orthopedics;  Laterality: Left;    REVIEW OF SYSTEMS:  Constitutional: positive for fatigue Eyes: negative Ears, nose, mouth, throat, and face: negative Respiratory: negative Cardiovascular: negative Gastrointestinal: negative Genitourinary:negative Integument/breast: negative Hematologic/lymphatic: negative Musculoskeletal:negative Neurological: negative Behavioral/Psych: negative Endocrine: negative Allergic/Immunologic: negative   PHYSICAL EXAMINATION: General appearance: alert,  cooperative, fatigued and no distress Head: Normocephalic, without obvious abnormality, atraumatic Neck: no adenopathy, no JVD, supple, symmetrical, trachea midline and thyroid not enlarged, symmetric, no tenderness/mass/nodules Lymph nodes: Cervical, supraclavicular, and axillary nodes normal. Resp: clear to auscultation bilaterally Back: symmetric, no curvature. ROM normal. No CVA tenderness. Cardio: regular rate and rhythm, S1, S2 normal, no murmur, click, rub or gallop GI: soft, non-tender; bowel sounds normal; no masses,  no organomegaly Extremities: extremities normal, atraumatic, no cyanosis or edema Neurologic: Alert and oriented X 3, normal strength and tone. Normal symmetric reflexes. Normal coordination and gait  ECOG PERFORMANCE STATUS: 1 - Symptomatic but completely ambulatory  Blood pressure (!) 160/73, pulse 68, temperature 97.8 F (36.6 C), temperature source Oral, resp. rate 20, height '5\' 11"'$  (1.803 m), weight 142 lb 12.8  oz (64.8 kg), SpO2 97 %.  LABORATORY DATA: Lab Results  Component Value Date   WBC 3.9 (L) 02/19/2019   HGB 14.3 02/19/2019   HCT 41.7 02/19/2019   MCV 93.9 02/19/2019   PLT 220 02/19/2019      Chemistry      Component Value Date/Time   NA 134 (L) 02/19/2019 0950   NA 132 (L) 12/06/2017 0930   K 3.9 02/19/2019 0950   K 4.0 12/06/2017 0930   CL 100 02/19/2019 0950   CO2 24 02/19/2019 0950   CO2 26 12/06/2017 0930   BUN 19 02/19/2019 0950   BUN 15.4 12/06/2017 0930   CREATININE 0.82 02/19/2019 0950   CREATININE 0.9 12/06/2017 0930      Component Value Date/Time   CALCIUM 9.1 02/19/2019 0950   CALCIUM 9.9 12/06/2017 0930   ALKPHOS 99 02/19/2019 0950   ALKPHOS 107 12/06/2017 0930   AST 21 02/19/2019 0950   AST 20 12/06/2017 0930   ALT 14 02/19/2019 0950   ALT 14 12/06/2017 0930   BILITOT 0.9 02/19/2019 0950   BILITOT 0.76 12/06/2017 0930       RADIOGRAPHIC STUDIES: Ct Chest W Contrast  Result Date: 02/18/2019 CLINICAL DATA:  Lung cancer, chemotherapy and XRT complete, immunotherapy in progress EXAM: CT CHEST, ABDOMEN, AND PELVIS WITH CONTRAST TECHNIQUE: Multidetector CT imaging of the chest, abdomen and pelvis was performed following the standard protocol during bolus administration of intravenous contrast. CONTRAST:  127m OMNIPAQUE IOHEXOL 300 MG/ML  SOLN COMPARISON:  10/08/2018 FINDINGS: CT CHEST FINDINGS Cardiovascular: Heart is normal in size. Small pericardial effusion. No evidence of thoracic aortic aneurysm. Atherosclerotic calcifications of the aortic arch. Three vessel coronary atherosclerosis. Mediastinum/Nodes: No suspicious mediastinal, hilar, or axillary lymphadenopathy. Visualized thyroid is unremarkable. Lungs/Pleura: Radiation changes in the medial left upper lobe. Multifocal ground-glass nodules throughout the lungs bilaterally. Dominant nodule measures 2.0 x 3.0 cm in the posterior right upper lobe (series 6/image 57), unchanged, with 9 mm solid component  superiorly (series 6/image 50). An additional sub solid nodule measures 10 mm in the medial left upper lobe (series 6/image 51), unchanged. No convincing progression or new solid component on today's CT. No focal consolidation. Moderate centrilobular and paraseptal emphysematous changes, upper lobe predominant. No pleural effusion or pneumothorax. Musculoskeletal: Degenerative changes of the thoracic spine. Multiple right rib deformities. CT ABDOMEN PELVIS FINDINGS Hepatobiliary: Liver is notable for an 8 mm cyst in segment 5 (series 2/image 72). Status post cholecystectomy. No intrahepatic or extrahepatic ductal dilatation. Pancreas: Within normal limits. Spleen: Within normal limits. Adrenals/Urinary Tract: Mild thickening of the bilateral adrenal glands. Kidneys are within normal limits. No hydronephrosis. Bladder is trabeculated with a small right posterolateral bladder diverticulum (  series 2/image 109). Stomach/Bowel: Stomach is within normal limits. No evidence of bowel obstruction. Appendix is not discretely visualized. Left colonic diverticulosis, without evidence of diverticulitis. Vascular/Lymphatic: No evidence of abdominal aortic aneurysm. Atherosclerotic calcifications of the abdominal aorta and branch vessels. No suspicious abdominopelvic lymphadenopathy. Reproductive: Prostate is unremarkable. Other: No abdominopelvic ascites. Musculoskeletal: Degenerative changes of the lumbar spine. IMPRESSION: Radiation changes in the medial left upper lobe. Dominant sub solid nodule in the posterior right upper lobe is unchanged. Additional sub solid and ground-glass nodules in the lungs bilaterally are grossly unchanged. Continued annual follow-up is suggested. No evidence of metastatic disease in the abdomen/pelvis. Additional stable ancillary findings as above. Electronically Signed   By: Julian Hy M.D.   On: 02/18/2019 16:58   Ct Abdomen Pelvis W Contrast  Result Date: 02/18/2019 CLINICAL DATA:   Lung cancer, chemotherapy and XRT complete, immunotherapy in progress EXAM: CT CHEST, ABDOMEN, AND PELVIS WITH CONTRAST TECHNIQUE: Multidetector CT imaging of the chest, abdomen and pelvis was performed following the standard protocol during bolus administration of intravenous contrast. CONTRAST:  164m OMNIPAQUE IOHEXOL 300 MG/ML  SOLN COMPARISON:  10/08/2018 FINDINGS: CT CHEST FINDINGS Cardiovascular: Heart is normal in size. Small pericardial effusion. No evidence of thoracic aortic aneurysm. Atherosclerotic calcifications of the aortic arch. Three vessel coronary atherosclerosis. Mediastinum/Nodes: No suspicious mediastinal, hilar, or axillary lymphadenopathy. Visualized thyroid is unremarkable. Lungs/Pleura: Radiation changes in the medial left upper lobe. Multifocal ground-glass nodules throughout the lungs bilaterally. Dominant nodule measures 2.0 x 3.0 cm in the posterior right upper lobe (series 6/image 57), unchanged, with 9 mm solid component superiorly (series 6/image 50). An additional sub solid nodule measures 10 mm in the medial left upper lobe (series 6/image 51), unchanged. No convincing progression or new solid component on today's CT. No focal consolidation. Moderate centrilobular and paraseptal emphysematous changes, upper lobe predominant. No pleural effusion or pneumothorax. Musculoskeletal: Degenerative changes of the thoracic spine. Multiple right rib deformities. CT ABDOMEN PELVIS FINDINGS Hepatobiliary: Liver is notable for an 8 mm cyst in segment 5 (series 2/image 72). Status post cholecystectomy. No intrahepatic or extrahepatic ductal dilatation. Pancreas: Within normal limits. Spleen: Within normal limits. Adrenals/Urinary Tract: Mild thickening of the bilateral adrenal glands. Kidneys are within normal limits. No hydronephrosis. Bladder is trabeculated with a small right posterolateral bladder diverticulum (series 2/image 109). Stomach/Bowel: Stomach is within normal limits. No evidence  of bowel obstruction. Appendix is not discretely visualized. Left colonic diverticulosis, without evidence of diverticulitis. Vascular/Lymphatic: No evidence of abdominal aortic aneurysm. Atherosclerotic calcifications of the abdominal aorta and branch vessels. No suspicious abdominopelvic lymphadenopathy. Reproductive: Prostate is unremarkable. Other: No abdominopelvic ascites. Musculoskeletal: Degenerative changes of the lumbar spine. IMPRESSION: Radiation changes in the medial left upper lobe. Dominant sub solid nodule in the posterior right upper lobe is unchanged. Additional sub solid and ground-glass nodules in the lungs bilaterally are grossly unchanged. Continued annual follow-up is suggested. No evidence of metastatic disease in the abdomen/pelvis. Additional stable ancillary findings as above. Electronically Signed   By: SJulian HyM.D.   On: 02/18/2019 16:58    ASSESSMENT AND PLAN:  This is a very pleasant 78years old white male with metastatic non-small cell lung cancer, adenocarcinoma and currently on treatment with second line immunotherapy with Nivolumab status post 60 cycles and has been tolerating his treatment well. He was recently switched to Nivolumab 480 MG IV every 4 weeks status post 20 cycles. The patient has been tolerating this treatment well with no concerning adverse effects. He had  repeat CT scan of the chest, abdomen and pelvis performed recently.  I personally and independently reviewed the scans and discussed the results with the patient and his wife today. His scan showed no concerning findings for disease progression. I recommended for the patient to continue his current treatment with nivolumab every 4 weeks.  He will proceed with cycle #21 today. For hypertension, he was advised to take his blood pressure medication as prescribed and to consult with his primary care physician for adjustment of his medication if needed. For pain management I will give the patient  a refill of Vicodin today. He will come back for follow-up visit in 4 weeks for evaluation before the next cycle of his treatment. The patient was advised to call immediately if he has any concerning symptoms in the interval. The patient voices understanding of current disease status and treatment options and is in agreement with the current care plan. All questions were answered. The patient knows to call the clinic with any problems, questions or concerns. We can certainly see the patient much sooner if necessary.  Disclaimer: This note was dictated with voice recognition software. Similar sounding words can inadvertently be transcribed and may not be corrected upon review.

## 2019-02-19 NOTE — Patient Instructions (Signed)
[place] Discharge Instructions for Patients Receiving Chemotherapy  Today you received the following chemotherapy agent: Nivolumab   To help prevent nausea and vomiting after your treatment, we encourage you to take your nausea medication as directed  If you develop nausea and vomiting that is not controlled by your nausea medication, call the clinic.   BELOW ARE SYMPTOMS THAT SHOULD BE REPORTED IMMEDIATELY:  *FEVER GREATER THAN 100.5 F  *CHILLS WITH OR WITHOUT FEVER  NAUSEA AND VOMITING THAT IS NOT CONTROLLED WITH YOUR NAUSEA MEDICATION  *UNUSUAL SHORTNESS OF BREATH  *UNUSUAL BRUISING OR BLEEDING  TENDERNESS IN MOUTH AND THROAT WITH OR WITHOUT PRESENCE OF ULCERS  *URINARY PROBLEMS  *BOWEL PROBLEMS  UNUSUAL RASH Items with * indicate a potential emergency and should be followed up as soon as possible.  Feel free to call the clinic you have any questions or concerns. The clinic phone number is [phone].

## 2019-02-20 ENCOUNTER — Telehealth: Payer: Self-pay | Admitting: Internal Medicine

## 2019-02-20 NOTE — Telephone Encounter (Signed)
Confirmed with patient appointments for April and May.

## 2019-03-12 ENCOUNTER — Other Ambulatory Visit: Payer: Self-pay | Admitting: Internal Medicine

## 2019-03-12 DIAGNOSIS — G893 Neoplasm related pain (acute) (chronic): Secondary | ICD-10-CM

## 2019-03-18 ENCOUNTER — Other Ambulatory Visit: Payer: Self-pay | Admitting: Medical Oncology

## 2019-03-18 DIAGNOSIS — C3431 Malignant neoplasm of lower lobe, right bronchus or lung: Secondary | ICD-10-CM

## 2019-03-19 ENCOUNTER — Inpatient Hospital Stay: Payer: Medicare Other

## 2019-03-19 ENCOUNTER — Inpatient Hospital Stay (HOSPITAL_BASED_OUTPATIENT_CLINIC_OR_DEPARTMENT_OTHER): Payer: Medicare Other | Admitting: Internal Medicine

## 2019-03-19 ENCOUNTER — Inpatient Hospital Stay: Payer: Medicare Other | Attending: Internal Medicine

## 2019-03-19 ENCOUNTER — Encounter: Payer: Self-pay | Admitting: Internal Medicine

## 2019-03-19 ENCOUNTER — Other Ambulatory Visit: Payer: Self-pay

## 2019-03-19 ENCOUNTER — Other Ambulatory Visit: Payer: Self-pay | Admitting: Internal Medicine

## 2019-03-19 VITALS — BP 158/71 | HR 67 | Temp 97.9°F | Resp 18 | Ht 71.0 in | Wt 142.4 lb

## 2019-03-19 DIAGNOSIS — C3431 Malignant neoplasm of lower lobe, right bronchus or lung: Secondary | ICD-10-CM

## 2019-03-19 DIAGNOSIS — Z5112 Encounter for antineoplastic immunotherapy: Secondary | ICD-10-CM | POA: Insufficient documentation

## 2019-03-19 DIAGNOSIS — G893 Neoplasm related pain (acute) (chronic): Secondary | ICD-10-CM

## 2019-03-19 DIAGNOSIS — C3490 Malignant neoplasm of unspecified part of unspecified bronchus or lung: Secondary | ICD-10-CM

## 2019-03-19 LAB — CBC WITH DIFFERENTIAL (CANCER CENTER ONLY)
Abs Immature Granulocytes: 0.02 10*3/uL (ref 0.00–0.07)
Basophils Absolute: 0 10*3/uL (ref 0.0–0.1)
Basophils Relative: 1 %
Eosinophils Absolute: 0.3 10*3/uL (ref 0.0–0.5)
Eosinophils Relative: 5 %
HCT: 41.3 % (ref 39.0–52.0)
Hemoglobin: 14.5 g/dL (ref 13.0–17.0)
Immature Granulocytes: 0 %
Lymphocytes Relative: 17 %
Lymphs Abs: 1 10*3/uL (ref 0.7–4.0)
MCH: 32.1 pg (ref 26.0–34.0)
MCHC: 35.1 g/dL (ref 30.0–36.0)
MCV: 91.4 fL (ref 80.0–100.0)
Monocytes Absolute: 0.6 10*3/uL (ref 0.1–1.0)
Monocytes Relative: 10 %
Neutro Abs: 3.9 10*3/uL (ref 1.7–7.7)
Neutrophils Relative %: 67 %
Platelet Count: 201 10*3/uL (ref 150–400)
RBC: 4.52 MIL/uL (ref 4.22–5.81)
RDW: 13.5 % (ref 11.5–15.5)
WBC Count: 5.8 10*3/uL (ref 4.0–10.5)
nRBC: 0 % (ref 0.0–0.2)

## 2019-03-19 LAB — CMP (CANCER CENTER ONLY)
ALT: 14 U/L (ref 0–44)
AST: 22 U/L (ref 15–41)
Albumin: 3.8 g/dL (ref 3.5–5.0)
Alkaline Phosphatase: 89 U/L (ref 38–126)
Anion gap: 11 (ref 5–15)
BUN: 17 mg/dL (ref 8–23)
CO2: 23 mmol/L (ref 22–32)
Calcium: 9.5 mg/dL (ref 8.9–10.3)
Chloride: 99 mmol/L (ref 98–111)
Creatinine: 0.8 mg/dL (ref 0.61–1.24)
GFR, Est AFR Am: >60 mL/min (ref >60)
GFR, Estimated: >60 mL/min (ref >60)
Glucose, Bld: 84 mg/dL (ref 70–99)
Potassium: 4.2 mmol/L (ref 3.5–5.1)
Sodium: 133 mmol/L — ABNORMAL LOW (ref 135–145)
Total Bilirubin: 0.7 mg/dL (ref 0.3–1.2)
Total Protein: 6.2 g/dL — ABNORMAL LOW (ref 6.5–8.1)

## 2019-03-19 MED ORDER — HYDROCODONE-ACETAMINOPHEN 5-325 MG PO TABS: 1.0000 | ORAL_TABLET | Freq: Four times a day (QID) | ORAL | 0 refills | Status: DC | PRN

## 2019-03-19 MED ORDER — ALPRAZOLAM 0.25 MG PO TABS: ORAL_TABLET | ORAL | 0 refills | Status: DC

## 2019-03-19 MED ORDER — SODIUM CHLORIDE 0.9 % IV SOLN
480.0000 mg | Freq: Once | INTRAVENOUS | Status: AC
  Administered 2019-03-19: 480 mg via INTRAVENOUS
  Filled 2019-03-19: qty 48

## 2019-03-19 MED ORDER — SODIUM CHLORIDE 0.9 % IV SOLN
Freq: Once | INTRAVENOUS | Status: AC
  Administered 2019-03-19: 12:00:00 via INTRAVENOUS
  Filled 2019-03-19: qty 250

## 2019-03-19 NOTE — Progress Notes (Signed)
[facility] [phone]   [phone]  OFFICE PROGRESS NOTE  [name], MD [place] [id number]  DIAGNOSIS: Stage IV (T1a, N0, M1b) non-small cell lung cancer, adenocarcinoma of the right lower lobe diagnosed in October 2014  Molecular profile: Negative forRET, ALK, BRAF, MET, EGFR.  Positive for ERBB2 A775T, [id number], KRAS G13E, MAP2K1 D67N (see full report).   PRIOR THERAPY:  1) Systemic chemotherapy with carboplatin for AUC of 5 and Alimta 500 mg/M2 every 3 weeks, status post 6 cycles with stable disease. First dose on 10/23/2013. 2) Maintenance systemic chemotherapy with single agent Alimta 500 mg/M2 every 3 weeks. Status post 8 cycles.  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 Sondra Come completed on 12/16/2014. 4) stereotactic radiotherapy to the enlarging right upper lobe lung nodule under the care of Dr. Sondra Come completed on 11/22/2016. 5) Immunotherapy with Nivolumab 240 mg IV every 2 weeks, status post 60 cycles. First cycle was given on 01/28/2015. Last dose 07/05/2017.  CURRENT THERAPY: Immunotherapy with Nivolumab 480 mg IV every 4 weeks. First dose 07/19/2017. Status post 21 cycles.  INTERVAL HISTORY: Anthony Curry. 78 y.o. male returns to the clinic today for follow-up visit.  The patient is feeling fine today with no concerning complaints.  He called a week ago complaining of sore throat but this is improved the next day.  He denied having any current chest pain, shortness of breath, cough or hemoptysis.  He denied having any fever or chills.  He has no nausea, vomiting, diarrhea or constipation.  He continues to tolerate his treatment with nivolumab fairly well.  The patient is here today for evaluation before starting cycle #22.  MEDICAL HISTORY: Past Medical History:  Diagnosis Date   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Anthony Curry)    Adrenal mass ([name])    Alcoholism  in remission ([name])    Anemia of chronic disease    Antineoplastic chemotherapy induced anemia(285.3)    Anxiety    BPH (benign prostatic hyperplasia)    Cancer ([facility])    small cell/lung   Complication of anesthesia 09/08/2015    JITTERY AFTER GALLBLADDER SURGERY   GERD (gastroesophageal reflux disease)    History of radiation therapy 11/14/16, 11/18/16, 11/22/16   Left upper lung: 54 Gy in 3 fractions   Hypertension    Hypertension 12/07/2016   Neoplastic malignant related fatigue    IV tx. every 2 weeks last9-14-16 (Dr. Earlie Server), radiation last tx. 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  MEDICATIONS:  Current Outpatient Medications  Medication Sig Dispense Refill   ALPRAZolam (XANAX) 0.25 MG tablet TAKE 1 TABLET(0.25 MG) BY MOUTH TWICE DAILY AS NEEDED FOR ANXIETY OR SLEEP 30 tablet 0   apixaban (ELIQUIS) 5 MG TABS tablet Take 5 mg by mouth 2 (two) times daily.     Ascorbic Acid (VITAMIN C PO) Take 1,000 mg by mouth daily.      B Complex-C (SUPER B COMPLEX PO) Take 1 each by mouth daily.      calcium carbonate (OS-CAL) 600 MG TABS Take 600 mg by mouth daily with breakfast.     demeclocycline (DECLOMYCIN) 150 MG tablet Take 150 mg by mouth 2 (two) times daily.  diltiazem (CARDIZEM CD) 180 MG 24 hr capsule Take 1 capsule (180 mg total) by mouth daily at 12 noon. 90 capsule 3   furosemide (LASIX) 40 MG tablet Take 0.5 tablets (20 mg total) by mouth every other day.     HYDROcodone-acetaminophen (NORCO/VICODIN) 5-325 MG tablet Take 1 tablet by mouth every 6 (six) hours as needed for moderate pain. 40 tablet 0   KLOR-CON M20 20 MEQ tablet Take 20 mEq by mouth every morning.      Lactobacillus (ACIDOPHILUS) CAPS capsule Take 1 capsule by mouth daily.     levothyroxine (SYNTHROID,  LEVOTHROID) 50 MCG tablet TAKE 1 TABLET (50 MCG TOTAL) BY MOUTH DAILY BEFORE BREAKFAST. 90 tablet 0   MAGNESIUM PO Take 400 mg by mouth daily.      Multiple Vitamin (MULTIVITAMIN WITH MINERALS) TABS Take 1 tablet by mouth daily.     Nivolumab (OPDIVO IV) Inject as directed every 2 weeks     nivolumab 480 mg in sodium chloride 0.9 % 100 mL Patient states receives this only once a month, not Q 2 weeks.     Nutritional Supplements (ENSURE COMPLETE PO) Take 1 Can by mouth 2 (two) times daily.     Omega-3 Fatty Acids (FISH OIL PO) Take 1 tablet by mouth daily.     pantoprazole (PROTONIX) 40 MG tablet Take 40 mg by mouth every morning.  8   tamsulosin (FLOMAX) 0.4 MG CAPS capsule Take 0.4 mg by mouth daily at 12 noon.      thiamine (VITAMIN B-1) 100 MG tablet Take 1 tablet (100 mg total) by mouth daily. 30 tablet 6   vitamin E 400 UNIT capsule Take 400 Units by mouth daily.     No current facility-administered medications for this visit.     SURGICAL HISTORY:  Past Surgical History:  Procedure Laterality Date   ANKLE ARTHRODESIS  1995   rt fx-hardware in   CARDIOVERSION N/A 09/29/2014   Procedure: CARDIOVERSION;  Surgeon: Josue Hector, MD;  Location: [facility] ENDOSCOPY;  Service: Cardiovascular;  Laterality: N/A;   CHOLECYSTECTOMY  09/08/2015    laproscopic    CHOLECYSTECTOMY N/A 09/08/2015   Procedure: LAPAROSCOPIC CHOLECYSTECTOMY WITH ATTEMPTED INTRAOPERATIVE CHOLANGIOGRAM ;  Surgeon: Fanny Skates, MD;  Location: [facility];  Service: General;  Laterality: N/A;   COLONOSCOPY     ERCP N/A 09/02/2015   Procedure: ENDOSCOPIC RETROGRADE CHOLANGIOPANCREATOGRAPHY (ERCP);  Surgeon: Arta Silence, MD;  Location: [facility];  Service: Endoscopy;  Laterality: N/A;   ERCP N/A 09/07/2015   Procedure: ENDOSCOPIC RETROGRADE CHOLANGIOPANCREATOGRAPHY (ERCP);  Surgeon: Clarene Essex, MD;  Location: [facility] ENDOSCOPY;  Service: Endoscopy;  Laterality: N/A;   EXCISION / CURETTAGE BONE CYST FINGER  2005    rt thumb   HEMORRHOID SURGERY     Percutaneous Cholecytostomy tube     IVR insertion 06-13-15(Dr. Vernard Gambles)- 08-28-15 remains in place to drainage bag   TONSILLECTOMY     TOTAL SHOULDER ARTHROPLASTY  02/03/2012   Procedure: TOTAL SHOULDER ARTHROPLASTY;  Surgeon: [name], MD;  Location: [facility];  Service: Orthopedics;  Laterality: Left;    REVIEW OF SYSTEMS:  A comprehensive review of systems was negative except for: Constitutional: positive for fatigue   PHYSICAL EXAMINATION: General appearance: alert, cooperative, fatigued and no distress Head: Normocephalic, without obvious abnormality, atraumatic Neck: no adenopathy, no JVD, supple, symmetrical, trachea midline and thyroid not enlarged, symmetric, no tenderness/mass/nodules Lymph nodes: Cervical, supraclavicular, and axillary nodes normal. Resp: clear to auscultation bilaterally Back: symmetric, no curvature.  ROM normal. No CVA tenderness. Cardio: regular rate and rhythm, S1, S2 normal, no murmur, click, rub or gallop GI: soft, non-tender; bowel sounds normal; no masses,  no organomegaly Extremities: extremities normal, atraumatic, no cyanosis or edema  ECOG PERFORMANCE STATUS: 1 - Symptomatic but completely ambulatory  Blood pressure (!) 158/71, pulse 67, temperature 97.9 F (36.6 C), temperature source Oral, resp. rate 18, height 5' 11" (1.803 m), weight 142 lb 6.4 oz (64.6 kg), SpO2 98 %.  LABORATORY DATA: Lab Results  Component Value Date   WBC 5.8 03/19/2019   HGB 14.5 03/19/2019   HCT 41.3 03/19/2019   MCV 91.4 03/19/2019   PLT 201 03/19/2019      Chemistry      Component Value Date/Time   NA 134 (L) 02/19/2019 0950   NA 132 (L) 12/06/2017 0930   K 3.9 02/19/2019 0950   K 4.0 12/06/2017 0930   CL 100 02/19/2019 0950   CO2 24 02/19/2019 0950   CO2 26 12/06/2017 0930   BUN 19 02/19/2019 0950   BUN 15.4 12/06/2017 0930   CREATININE 0.82 02/19/2019 0950   CREATININE 0.9 12/06/2017 0930       Component Value Date/Time   CALCIUM 9.1 02/19/2019 0950   CALCIUM 9.9 12/06/2017 0930   ALKPHOS 99 02/19/2019 0950   ALKPHOS 107 12/06/2017 0930   AST 21 02/19/2019 0950   AST 20 12/06/2017 0930   ALT 14 02/19/2019 0950   ALT 14 12/06/2017 0930   BILITOT 0.9 02/19/2019 0950   BILITOT 0.76 12/06/2017 0930       RADIOGRAPHIC STUDIES: Ct Chest W Contrast  Result Date: 02/18/2019 CLINICAL DATA:  Lung cancer, chemotherapy and XRT complete, immunotherapy in progress EXAM: CT CHEST, ABDOMEN, AND PELVIS WITH CONTRAST TECHNIQUE: Multidetector CT imaging of the chest, abdomen and pelvis was performed following the standard protocol during bolus administration of intravenous contrast. CONTRAST:  143m OMNIPAQUE IOHEXOL 300 MG/ML  SOLN COMPARISON:  10/08/2018 FINDINGS: CT CHEST FINDINGS Cardiovascular: Heart is normal in size. Small pericardial effusion. No evidence of thoracic aortic aneurysm. Atherosclerotic calcifications of the aortic arch. Three vessel coronary atherosclerosis. Mediastinum/Nodes: No suspicious mediastinal, hilar, or axillary lymphadenopathy. Visualized thyroid is unremarkable. Lungs/Pleura: Radiation changes in the medial left upper lobe. Multifocal ground-glass nodules throughout the lungs bilaterally. Dominant nodule measures 2.0 x 3.0 cm in the posterior right upper lobe (series 6/image 57), unchanged, with 9 mm solid component superiorly (series 6/image 50). An additional sub solid nodule measures 10 mm in the medial left upper lobe (series 6/image 51), unchanged. No convincing progression or new solid component on today's CT. No focal consolidation. Moderate centrilobular and paraseptal emphysematous changes, upper lobe predominant. No pleural effusion or pneumothorax. Musculoskeletal: Degenerative changes of the thoracic spine. Multiple right rib deformities. CT ABDOMEN PELVIS FINDINGS Hepatobiliary: Liver is notable for an 8 mm cyst in segment 5 (series 2/image 72). Status post  cholecystectomy. No intrahepatic or extrahepatic ductal dilatation. Pancreas: Within normal limits. Spleen: Within normal limits. Adrenals/Urinary Tract: Mild thickening of the bilateral adrenal glands. Kidneys are within normal limits. No hydronephrosis. Bladder is trabeculated with a small right posterolateral bladder diverticulum (series 2/image 109). Stomach/Bowel: Stomach is within normal limits. No evidence of bowel obstruction. Appendix is not discretely visualized. Left colonic diverticulosis, without evidence of diverticulitis. Vascular/Lymphatic: No evidence of abdominal aortic aneurysm. Atherosclerotic calcifications of the abdominal aorta and branch vessels. No suspicious abdominopelvic lymphadenopathy. Reproductive: Prostate is unremarkable. Other: No abdominopelvic ascites. Musculoskeletal: Degenerative changes of the lumbar spine. IMPRESSION:  Radiation changes in the medial left upper lobe. Dominant sub solid nodule in the posterior right upper lobe is unchanged. Additional sub solid and ground-glass nodules in the lungs bilaterally are grossly unchanged. Continued annual follow-up is suggested. No evidence of metastatic disease in the abdomen/pelvis. Additional stable ancillary findings as above. Electronically Signed   By: Julian Hy M.D.   On: 02/18/2019 16:58   Ct Abdomen Pelvis W Contrast  Result Date: 02/18/2019 CLINICAL DATA:  Lung cancer, chemotherapy and XRT complete, immunotherapy in progress EXAM: CT CHEST, ABDOMEN, AND PELVIS WITH CONTRAST TECHNIQUE: Multidetector CT imaging of the chest, abdomen and pelvis was performed following the standard protocol during bolus administration of intravenous contrast. CONTRAST:  147m OMNIPAQUE IOHEXOL 300 MG/ML  SOLN COMPARISON:  10/08/2018 FINDINGS: CT CHEST FINDINGS Cardiovascular: Heart is normal in size. Small pericardial effusion. No evidence of thoracic aortic aneurysm. Atherosclerotic calcifications of the aortic arch. Three vessel  coronary atherosclerosis. Mediastinum/Nodes: No suspicious mediastinal, hilar, or axillary lymphadenopathy. Visualized thyroid is unremarkable. Lungs/Pleura: Radiation changes in the medial left upper lobe. Multifocal ground-glass nodules throughout the lungs bilaterally. Dominant nodule measures 2.0 x 3.0 cm in the posterior right upper lobe (series 6/image 57), unchanged, with 9 mm solid component superiorly (series 6/image 50). An additional sub solid nodule measures 10 mm in the medial left upper lobe (series 6/image 51), unchanged. No convincing progression or new solid component on today's CT. No focal consolidation. Moderate centrilobular and paraseptal emphysematous changes, upper lobe predominant. No pleural effusion or pneumothorax. Musculoskeletal: Degenerative changes of the thoracic spine. Multiple right rib deformities. CT ABDOMEN PELVIS FINDINGS Hepatobiliary: Liver is notable for an 8 mm cyst in segment 5 (series 2/image 72). Status post cholecystectomy. No intrahepatic or extrahepatic ductal dilatation. Pancreas: Within normal limits. Spleen: Within normal limits. Adrenals/Urinary Tract: Mild thickening of the bilateral adrenal glands. Kidneys are within normal limits. No hydronephrosis. Bladder is trabeculated with a small right posterolateral bladder diverticulum (series 2/image 109). Stomach/Bowel: Stomach is within normal limits. No evidence of bowel obstruction. Appendix is not discretely visualized. Left colonic diverticulosis, without evidence of diverticulitis. Vascular/Lymphatic: No evidence of abdominal aortic aneurysm. Atherosclerotic calcifications of the abdominal aorta and branch vessels. No suspicious abdominopelvic lymphadenopathy. Reproductive: Prostate is unremarkable. Other: No abdominopelvic ascites. Musculoskeletal: Degenerative changes of the lumbar spine. IMPRESSION: Radiation changes in the medial left upper lobe. Dominant sub solid nodule in the posterior right upper lobe is  unchanged. Additional sub solid and ground-glass nodules in the lungs bilaterally are grossly unchanged. Continued annual follow-up is suggested. No evidence of metastatic disease in the abdomen/pelvis. Additional stable ancillary findings as above. Electronically Signed   By: SJulian HyM.D.   On: 02/18/2019 16:58    ASSESSMENT AND PLAN:  This is a very pleasant 78years old white male with metastatic non-small cell lung cancer, adenocarcinoma and currently on treatment with second line immunotherapy with Nivolumab status post 60 cycles and has been tolerating his treatment well. He was recently switched to Nivolumab 480 MG IV every 4 weeks status post 21 cycles. The patient continues to tolerate this treatment well with no concerning adverse effects. I recommended for him to proceed with cycle #22 today as scheduled. I will see the patient back for follow-up visit in 4 weeks for evaluation before starting cycle #23. The patient was advised to call immediately if he has any concerning symptoms in the interval. The patient voices understanding of current disease status and treatment options and is in agreement with the current  care plan. All questions were answered. The patient knows to call the clinic with any problems, questions or concerns. We can certainly see the patient much sooner if necessary.  Disclaimer: This note was dictated with voice recognition software. Similar sounding words can inadvertently be transcribed and may not be corrected upon review.

## 2019-03-19 NOTE — Patient Instructions (Signed)
Coronavirus (COVID-19) Are you at risk?  Are you at risk for the Coronavirus (COVID-19)?  To be considered HIGH RISK for Coronavirus (COVID-19), you have to meet the following criteria:  . Traveled to [place], [place], [place], [place] or [place]; or in the [place] to [place], [place], [place], or [place]; and have fever, cough, and shortness of breath within the last 2 weeks of travel OR . Been in close contact with a person diagnosed with COVID-19 within the last 2 weeks and have fever, cough, and shortness of breath . IF YOU DO NOT MEET THESE CRITERIA, YOU ARE CONSIDERED LOW RISK FOR COVID-19.  What to do if you are HIGH RISK for COVID-19?  . If you are having a medical emergency, call 911. . Seek medical care right away. Before you go to a doctor's office, urgent care or emergency department, call ahead and tell them about your recent travel, contact with someone diagnosed with COVID-19, and your symptoms. You should receive instructions from your physician's office regarding next steps of care.  . When you arrive at healthcare provider, tell the healthcare staff immediately you have returned from visiting [place], [place], [place], [place] or [place]; or traveled in the [place] to [place], [place], [place], or [place]; in the last two weeks or you have been in close contact with a person diagnosed with COVID-19 in the last 2 weeks.   . Tell the health care staff about your symptoms: fever, cough and shortness of breath. . After you have been seen by a medical provider, you will be either: o Tested for (COVID-19) and discharged home on quarantine except to seek medical care if symptoms worsen, and asked to  - Stay home and avoid contact with others until you get your results (4-5 days)  - Avoid travel on public transportation if possible (such as bus, train, or airplane) or o Sent to the Emergency Department by EMS for evaluation, COVID-19 testing, and possible  admission depending on your condition and test results.  What to do if you are LOW RISK for COVID-19?  Reduce your risk of any infection by using the same precautions used for avoiding the common cold or flu:  . Wash your hands often with soap and warm water for at least 20 seconds.  If soap and water are not readily available, use an alcohol-based hand sanitizer with at least 60% alcohol.  . If coughing or sneezing, cover your mouth and nose by coughing or sneezing into the elbow areas of your shirt or coat, into a tissue or into your sleeve (not your hands). . Avoid shaking hands with others and consider head nods or verbal greetings only. . Avoid touching your eyes, nose, or mouth with unwashed hands.  . Avoid close contact with people who are sick. . Avoid places or events with large numbers of people in one location, like concerts or sporting events. . Carefully consider travel plans you have or are making. . If you are planning any travel outside or inside the [place], visit the CDC's Travelers' Health webpage for the latest health notices. . If you have some symptoms but not all symptoms, continue to monitor at home and seek medical attention if your symptoms worsen. . If you are having a medical emergency, call 911.   ADDITIONAL HEALTHCARE OPTIONS FOR PATIENTS  [facility] / e-Visit: [email or web address]         [facility]: [phone]  [place]   Urgent Care: [facility]: [facility] Discharge Instructions for Patients Receiving Chemotherapy  Today you received the following chemotherapy agents: Nivolumab (Opdivo)  To help prevent nausea and vomiting after your treatment, we encourage you to take your nausea medication as directed.    If you develop nausea and vomiting that is not controlled by your nausea medication, call the clinic.    BELOW ARE SYMPTOMS THAT SHOULD BE REPORTED IMMEDIATELY:  *FEVER GREATER THAN 100.5 F  *CHILLS WITH OR WITHOUT FEVER  NAUSEA AND VOMITING THAT IS NOT CONTROLLED WITH YOUR NAUSEA MEDICATION  *UNUSUAL SHORTNESS OF BREATH  *UNUSUAL BRUISING OR BLEEDING  TENDERNESS IN MOUTH AND THROAT WITH OR WITHOUT PRESENCE OF ULCERS  *URINARY PROBLEMS  *BOWEL PROBLEMS  UNUSUAL RASH Items with * indicate a potential emergency and should be followed up as soon as possible.  Feel free to call the clinic should you have any questions or concerns. The clinic phone number is [phone].  Please show the [facility] at check-in to the Emergency Department and triage nurse.

## 2019-03-27 DIAGNOSIS — H353221 Exudative age-related macular degeneration, left eye, with active choroidal neovascularization: Secondary | ICD-10-CM | POA: Diagnosis not present

## 2019-03-27 DIAGNOSIS — H353112 Nonexudative age-related macular degeneration, right eye, intermediate dry stage: Secondary | ICD-10-CM | POA: Diagnosis not present

## 2019-04-09 ENCOUNTER — Ambulatory Visit: Payer: Medicare Other | Admitting: Internal Medicine

## 2019-04-09 ENCOUNTER — Ambulatory Visit: Payer: Medicare Other

## 2019-04-09 ENCOUNTER — Other Ambulatory Visit: Payer: Medicare Other

## 2019-04-16 ENCOUNTER — Other Ambulatory Visit: Payer: Self-pay | Admitting: Medical Oncology

## 2019-04-16 ENCOUNTER — Inpatient Hospital Stay (HOSPITAL_BASED_OUTPATIENT_CLINIC_OR_DEPARTMENT_OTHER): Payer: Medicare Other | Admitting: Internal Medicine

## 2019-04-16 ENCOUNTER — Other Ambulatory Visit: Payer: Self-pay

## 2019-04-16 ENCOUNTER — Inpatient Hospital Stay: Payer: Medicare Other | Attending: Internal Medicine

## 2019-04-16 ENCOUNTER — Telehealth: Payer: Self-pay

## 2019-04-16 ENCOUNTER — Inpatient Hospital Stay: Payer: Medicare Other

## 2019-04-16 ENCOUNTER — Encounter: Payer: Self-pay | Admitting: Internal Medicine

## 2019-04-16 VITALS — BP 175/85 | HR 97 | Temp 98.1°F | Resp 20 | Ht 71.0 in | Wt 143.8 lb

## 2019-04-16 DIAGNOSIS — C3431 Malignant neoplasm of lower lobe, right bronchus or lung: Secondary | ICD-10-CM

## 2019-04-16 DIAGNOSIS — Z79899 Other long term (current) drug therapy: Secondary | ICD-10-CM | POA: Insufficient documentation

## 2019-04-16 DIAGNOSIS — I10 Essential (primary) hypertension: Secondary | ICD-10-CM | POA: Diagnosis not present

## 2019-04-16 DIAGNOSIS — G893 Neoplasm related pain (acute) (chronic): Secondary | ICD-10-CM

## 2019-04-16 DIAGNOSIS — R5382 Chronic fatigue, unspecified: Secondary | ICD-10-CM

## 2019-04-16 DIAGNOSIS — R42 Dizziness and giddiness: Secondary | ICD-10-CM

## 2019-04-16 DIAGNOSIS — Z5112 Encounter for antineoplastic immunotherapy: Secondary | ICD-10-CM

## 2019-04-16 DIAGNOSIS — C3490 Malignant neoplasm of unspecified part of unspecified bronchus or lung: Secondary | ICD-10-CM

## 2019-04-16 DIAGNOSIS — R5383 Other fatigue: Secondary | ICD-10-CM

## 2019-04-16 LAB — CMP (CANCER CENTER ONLY)
ALT: 17 U/L (ref 0–44)
AST: 18 U/L (ref 15–41)
Albumin: 3.9 g/dL (ref 3.5–5.0)
Alkaline Phosphatase: 98 U/L (ref 38–126)
Anion gap: 9 (ref 5–15)
BUN: 18 mg/dL (ref 8–23)
CO2: 25 mmol/L (ref 22–32)
Calcium: 9.5 mg/dL (ref 8.9–10.3)
Chloride: 102 mmol/L (ref 98–111)
Creatinine: 0.93 mg/dL (ref 0.61–1.24)
GFR, Est AFR Am: >60 mL/min (ref >60)
GFR, Estimated: >60 mL/min (ref >60)
Glucose, Bld: 95 mg/dL (ref 70–99)
Potassium: 3.8 mmol/L (ref 3.5–5.1)
Sodium: 136 mmol/L (ref 135–145)
Total Bilirubin: 0.6 mg/dL (ref 0.3–1.2)
Total Protein: 6.4 g/dL — ABNORMAL LOW (ref 6.5–8.1)

## 2019-04-16 LAB — CBC WITH DIFFERENTIAL (CANCER CENTER ONLY)
Abs Immature Granulocytes: 0.02 10*3/uL (ref 0.00–0.07)
Basophils Absolute: 0 10*3/uL (ref 0.0–0.1)
Basophils Relative: 1 %
Eosinophils Absolute: 0.2 10*3/uL (ref 0.0–0.5)
Eosinophils Relative: 3 %
HCT: 43.5 % (ref 39.0–52.0)
Hemoglobin: 14.9 g/dL (ref 13.0–17.0)
Immature Granulocytes: 0 %
Lymphocytes Relative: 16 %
Lymphs Abs: 0.9 10*3/uL (ref 0.7–4.0)
MCH: 31.8 pg (ref 26.0–34.0)
MCHC: 34.3 g/dL (ref 30.0–36.0)
MCV: 92.9 fL (ref 80.0–100.0)
Monocytes Absolute: 0.6 10*3/uL (ref 0.1–1.0)
Monocytes Relative: 10 %
Neutro Abs: 3.8 10*3/uL (ref 1.7–7.7)
Neutrophils Relative %: 70 %
Platelet Count: 213 10*3/uL (ref 150–400)
RBC: 4.68 MIL/uL (ref 4.22–5.81)
RDW: 14.2 % (ref 11.5–15.5)
WBC Count: 5.4 10*3/uL (ref 4.0–10.5)
nRBC: 0 % (ref 0.0–0.2)

## 2019-04-16 LAB — TSH: TSH: 2.157 u[IU]/mL (ref 0.320–4.118)

## 2019-04-16 MED ORDER — SODIUM CHLORIDE 0.9 % IV SOLN
Freq: Once | INTRAVENOUS | Status: AC
  Administered 2019-04-16: 13:00:00 via INTRAVENOUS
  Filled 2019-04-16: qty 250

## 2019-04-16 MED ORDER — HYDROCODONE-ACETAMINOPHEN 5-325 MG PO TABS: 1.0000 | ORAL_TABLET | Freq: Four times a day (QID) | ORAL | 0 refills | Status: DC | PRN

## 2019-04-16 MED ORDER — SODIUM CHLORIDE 0.9 % IV SOLN
480.0000 mg | Freq: Once | INTRAVENOUS | Status: AC
  Administered 2019-04-16: 13:00:00 480 mg via INTRAVENOUS
  Filled 2019-04-16: qty 48

## 2019-04-16 NOTE — Telephone Encounter (Signed)
Nutrition Follow-up:  RD working remotely.  Patient with lung cancer followed by Dr. Earlie Server.  Patient currently receiving nivolumab  Spoke with patient via phone today.  Patient reports that his wife has had a hard time finding the high calorie oral nutrition supplement shakes.  Reports that he is drinking 2 shakes per day.  Continues to report that he eats when he is hungry.  Snacks on trail mix.    Medications: reviewed  Labs: reviewed  Anthropometrics:   Weight 143 lb today slight increase from 142 lb in March 2020.     NUTRITION DIAGNOSIS: Increased nutrient needs continue   INTERVENTION:  Discussed where high calorie shakes are located in retail stores and online.  Offered another case of ensure enlive and patient will pick up on Wednesday.   Encouraged high calorie high protein foods to prevent weight loss.     MONITORING, EVALUATION, GOAL: Patient will consume adequate calories and protein to maintain weight   NEXT VISIT: phone f/u June 9   Sofiah Lyne B. Zenia Resides, [place], [place] Registered Dietitian [phone] (pager)

## 2019-04-16 NOTE — Progress Notes (Signed)
[place] [phone]   [phone]  OFFICE PROGRESS NOTE  [name], MD [place] [address]  DIAGNOSIS: Stage IV (T1a, N0, M1b) non-small cell lung cancer, adenocarcinoma of the right lower lobe diagnosed in October 2014  Molecular profile: Negative forRET, ALK, BRAF, MET, EGFR.  Positive for ERBB2 A775T, TDV7O160V, KRAS G13E, MAP2K1 D67N (see full report).   PRIOR THERAPY:  1) Systemic chemotherapy with carboplatin for AUC of 5 and Alimta 500 mg/M2 every 3 weeks, status post 6 cycles with stable disease. First dose on 10/23/2013. 2) Maintenance systemic chemotherapy with single agent Alimta 500 mg/M2 every 3 weeks. Status post 8 cycles.  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 Sondra Come completed on 12/16/2014. 4) stereotactic radiotherapy to the enlarging right upper lobe lung nodule under the care of Dr. Sondra Come completed on 11/22/2016. 5) Immunotherapy with Nivolumab 240 mg IV every 2 weeks, status post 60 cycles. First cycle was given on 01/28/2015. Last dose 07/05/2017.  CURRENT THERAPY: Immunotherapy with Nivolumab 480 mg IV every 4 weeks. First dose 07/19/2017. Status post 22 cycles.  INTERVAL HISTORY: Anthony Curry. 78 y.o. male returns to the clinic today for follow-up visit.  The patient is feeling fine today with except for the persistent fatigue and occasional dizzy spells.  He denied having any recent chest pain, shortness of breath, cough or hemoptysis.  He denied having any fever or chills.  He has no nausea, vomiting, diarrhea or constipation.  The patient continues to tolerate his treatment with nivolumab fairly well.  He is here today for evaluation before starting cycle #23.   MEDICAL HISTORY: Past Medical History:  Diagnosis Date  . 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[place])   . Adrenal mass ([place])   . Alcoholism in remission ([place])   . Anemia of chronic  disease   . 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 Anxiety   . BPH (benign prostatic hyperplasia)   . Cancer ([place])    small cell/lung  . Complication of anesthesia 09/08/2015    JITTERY AFTER GALLBLADDER SURGERY  . GERD (gastroesophageal reflux disease)   . History of radiation therapy 11/14/16, 11/18/16, 11/22/16   Left upper lung: 54 Gy in 3 fractions  . Hypertension   . Hypertension 12/07/2016  . Neoplastic malignant related fatigue    IV tx. every 2 weeks last9-14-16 (Dr. Earlie Server), radiation last tx. 6 months  . 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 Persistent atrial fibrillation   . Pulmonary nodules   . Radiation 12/02/14-12/16/14   Left adrenal gland 30 Gy in 10 fractions    ALLERGIES:  has No Known Allergies.  MEDICATIONS:  Current Outpatient Medications  Medication Sig Dispense Refill  . ALPRAZolam (XANAX) 0.25 MG tablet TAKE 1 TABLET(0.25 MG) BY MOUTH TWICE DAILY AS NEEDED FOR ANXIETY OR SLEEP 30 tablet 0  . apixaban (ELIQUIS) 5 MG TABS tablet Take 5 mg by mouth 2 (two) times daily.    . Ascorbic Acid (VITAMIN C PO) Take 1,000 mg by mouth daily.     . B Complex-C (SUPER B COMPLEX PO) Take 1 each by mouth daily.     . calcium carbonate (OS-CAL) 600 MG TABS Take 600 mg by mouth daily with breakfast.    . demeclocycline (DECLOMYCIN) 150 MG tablet Take 150 mg by mouth 2 (two) times daily.    [name] diltiazem (CARDIZEM CD) 180 MG 24 hr capsule  Take 1 capsule (180 mg total) by mouth daily at 12 noon. 90 capsule 3  . furosemide (LASIX) 40 MG tablet Take 0.5 tablets (20 mg total) by mouth every other day.    [name] HYDROcodone-acetaminophen (NORCO/VICODIN) 5-325 MG tablet Take 1 tablet by mouth every 6 (six) hours as needed for moderate pain. 40 tablet 0  . KLOR-CON M20 20 MEQ tablet Take 20 mEq by mouth every morning.     . Lactobacillus (ACIDOPHILUS) CAPS capsule Take 1 capsule by mouth daily.    [name] levothyroxine (SYNTHROID, LEVOTHROID) 50 MCG tablet TAKE 1 TABLET  (50 MCG TOTAL) BY MOUTH DAILY BEFORE BREAKFAST. 90 tablet 0  . MAGNESIUM PO Take 400 mg by mouth daily.     . Multiple Vitamin (MULTIVITAMIN WITH MINERALS) TABS Take 1 tablet by mouth daily.    . Nivolumab (OPDIVO IV) Inject as directed every 2 weeks    . nivolumab 480 mg in sodium chloride 0.9 % 100 mL Patient states receives this only once a month, not Q 2 weeks.    . Nutritional Supplements (ENSURE COMPLETE PO) Take 1 Can by mouth 2 (two) times daily.    . Omega-3 Fatty Acids (FISH OIL PO) Take 1 tablet by mouth daily.    . pantoprazole (PROTONIX) 40 MG tablet Take 40 mg by mouth every morning.  8  . tamsulosin (FLOMAX) 0.4 MG CAPS capsule Take 0.4 mg by mouth daily at 12 noon.     . thiamine (VITAMIN B-1) 100 MG tablet Take 1 tablet (100 mg total) by mouth daily. 30 tablet 6  . vitamin E 400 UNIT capsule Take 400 Units by mouth daily.     No current facility-administered medications for this visit.     SURGICAL HISTORY:  Past Surgical History:  Procedure Laterality Date  . ANKLE ARTHRODESIS  1995   rt fx-hardware in  . CARDIOVERSION N/A 09/29/2014   Procedure: CARDIOVERSION;  Surgeon: Josue Hector, MD;  Location: [facility] ENDOSCOPY;  Service: Cardiovascular;  Laterality: N/A;  . CHOLECYSTECTOMY  09/08/2015    laproscopic   . CHOLECYSTECTOMY N/A 09/08/2015   Procedure: LAPAROSCOPIC CHOLECYSTECTOMY WITH ATTEMPTED INTRAOPERATIVE CHOLANGIOGRAM ;  Surgeon: Fanny Skates, MD;  Location: [facility];  Service: General;  Laterality: N/A;  . COLONOSCOPY    . ERCP N/A 09/02/2015   Procedure: ENDOSCOPIC RETROGRADE CHOLANGIOPANCREATOGRAPHY (ERCP);  Surgeon: Arta Silence, MD;  Location: [facility] ENDOSCOPY;  Service: Endoscopy;  Laterality: N/A;  . ERCP N/A 09/07/2015   Procedure: ENDOSCOPIC RETROGRADE CHOLANGIOPANCREATOGRAPHY (ERCP);  Surgeon: Clarene Essex, MD;  Location: [facility] ENDOSCOPY;  Service: Endoscopy;  Laterality: N/A;  . EXCISION / CURETTAGE BONE CYST FINGER  2005   rt thumb  . HEMORRHOID SURGERY    .  Percutaneous Cholecytostomy tube     IVR insertion 06-13-15(Dr. Vernard Gambles)- 08-28-15 remains in place to drainage bag  . TONSILLECTOMY    . TOTAL SHOULDER ARTHROPLASTY  02/03/2012   Procedure: TOTAL SHOULDER ARTHROPLASTY;  Surgeon: [name], MD;  Location: [facility];  Service: Orthopedics;  Laterality: Left;    REVIEW OF SYSTEMS:  A comprehensive review of systems was negative except for: Constitutional: positive for fatigue Neurological: positive for dizziness   PHYSICAL EXAMINATION: General appearance: alert, cooperative, fatigued and no distress Head: Normocephalic, without obvious abnormality, atraumatic Neck: no adenopathy, no JVD, supple, symmetrical, trachea midline and thyroid not enlarged, symmetric, no tenderness/mass/nodules Lymph nodes: Cervical, supraclavicular, and axillary nodes normal. Resp: clear to auscultation bilaterally Back: symmetric, no curvature. ROM normal. No CVA  tenderness. Cardio: regular rate and rhythm, S1, S2 normal, no murmur, click, rub or gallop GI: soft, non-tender; bowel sounds normal; no masses,  no organomegaly Extremities: extremities normal, atraumatic, no cyanosis or edema  ECOG PERFORMANCE STATUS: 1 - Symptomatic but completely ambulatory  Blood pressure (!) 175/85, pulse 97, temperature 98.1 F (36.7 C), temperature source Oral, resp. rate 20, height _0  (1.803 m), weight 143 lb 12.8 oz (65.2 kg), SpO2 98 %.  LABORATORY DATA: Lab Results  Component Value Date   WBC 5.4 04/16/2019   HGB 14.9 04/16/2019   HCT 43.5 04/16/2019   MCV 92.9 04/16/2019   PLT 213 04/16/2019      Chemistry      Component Value Date/Time   NA 136 04/16/2019 1104   NA 132 (L) 12/06/2017 0930   K 3.8 04/16/2019 1104   K 4.0 12/06/2017 0930   CL 102 04/16/2019 1104   CO2 25 04/16/2019 1104   CO2 26 12/06/2017 0930   BUN 18 04/16/2019 1104   BUN 15.4 12/06/2017 0930   CREATININE 0.93 04/16/2019 1104   CREATININE 0.9 12/06/2017 0930       Component Value Date/Time   CALCIUM 9.5 04/16/2019 1104   CALCIUM 9.9 12/06/2017 0930   ALKPHOS 98 04/16/2019 1104   ALKPHOS 107 12/06/2017 0930   AST 18 04/16/2019 1104   AST 20 12/06/2017 0930   ALT 17 04/16/2019 1104   ALT 14 12/06/2017 0930   BILITOT 0.6 04/16/2019 1104   BILITOT 0.76 12/06/2017 0930       RADIOGRAPHIC STUDIES: No results found.  ASSESSMENT AND PLAN:  This is a very pleasant 78 years old white male with metastatic non-small cell lung cancer, adenocarcinoma and currently on treatment with second line immunotherapy with Nivolumab status post 60 cycles and has been tolerating his treatment well. He was recently switched to Nivolumab 480 MG IV every 4 weeks status post 22 cycles. The patient continues to tolerate this treatment well with no concerning adverse effects. I recommended for him to proceed with cycle #23 today as scheduled. For hypertension he was advised to take his blood pressure medication as prescribed and to discuss with his primary care physician for adjustment of his medication if needed. He will come back for follow-up visit in 4 weeks for evaluation after repeating CT scan of the chest, abdomen and pelvis for restaging of his disease. The patient voices understanding of current disease status and treatment options and is in agreement with the current care plan. All questions were answered. The patient knows to call the clinic with any problems, questions or concerns. We can certainly see the patient much sooner if necessary.  Disclaimer: This note was dictated with voice recognition software. Similar sounding words can inadvertently be transcribed and may not be corrected upon review.

## 2019-04-16 NOTE — Patient Instructions (Signed)
Coronavirus (COVID-19) Are you at risk?  Are you at risk for the Coronavirus (COVID-19)?  To be considered HIGH RISK for Coronavirus (COVID-19), you have to meet the following criteria:  . Traveled to [place], [place], [place], [place] or [place]; or in the [place] to [place], [place], [place], or [place]; and have fever, cough, and shortness of breath within the last 2 weeks of travel OR . Been in close contact with a person diagnosed with COVID-19 within the last 2 weeks and have fever, cough, and shortness of breath . IF YOU DO NOT MEET THESE CRITERIA, YOU ARE CONSIDERED LOW RISK FOR COVID-19.  What to do if you are HIGH RISK for COVID-19?  . If you are having a medical emergency, call 911. . Seek medical care right away. Before you go to a doctor's office, urgent care or emergency department, call ahead and tell them about your recent travel, contact with someone diagnosed with COVID-19, and your symptoms. You should receive instructions from your physician's office regarding next steps of care.  . When you arrive at healthcare provider, tell the healthcare staff immediately you have returned from visiting [place], [place], [place], [place] or [place]; or traveled in the [place] to [place], [place], [place], or [place]; in the last two weeks or you have been in close contact with a person diagnosed with COVID-19 in the last 2 weeks.   . Tell the health care staff about your symptoms: fever, cough and shortness of breath. . After you have been seen by a medical provider, you will be either: o Tested for (COVID-19) and discharged home on quarantine except to seek medical care if symptoms worsen, and asked to  - Stay home and avoid contact with others until you get your results (4-5 days)  - Avoid travel on public transportation if possible (such as bus, train, or airplane) or o Sent to the Emergency Department by EMS for evaluation, COVID-19 testing, and possible  admission depending on your condition and test results.  What to do if you are LOW RISK for COVID-19?  Reduce your risk of any infection by using the same precautions used for avoiding the common cold or flu:  . Wash your hands often with soap and warm water for at least 20 seconds.  If soap and water are not readily available, use an alcohol-based hand sanitizer with at least 60% alcohol.  . If coughing or sneezing, cover your mouth and nose by coughing or sneezing into the elbow areas of your shirt or coat, into a tissue or into your sleeve (not your hands). . Avoid shaking hands with others and consider head nods or verbal greetings only. . Avoid touching your eyes, nose, or mouth with unwashed hands.  . Avoid close contact with people who are sick. . Avoid places or events with large numbers of people in one location, like concerts or sporting events. . Carefully consider travel plans you have or are making. . If you are planning any travel outside or inside the [place], visit the CDC's Travelers' Health webpage for the latest health notices. . If you have some symptoms but not all symptoms, continue to monitor at home and seek medical attention if your symptoms worsen. . If you are having a medical emergency, call 911.   ADDITIONAL HEALTHCARE OPTIONS FOR PATIENTS  [facility] / e-Visit: [email or web address]         [facility]: [phone]  [place]   Urgent Care: [facility]: [place] Discharge Instructions for Patients Receiving Chemotherapy  Today you received the following chemotherapy agents: Nivolumab (Opdivo)  To help prevent nausea and vomiting after your treatment, we encourage you to take your nausea medication as directed.    If you develop nausea and vomiting that is not controlled by your nausea medication, call the clinic.    BELOW ARE SYMPTOMS THAT SHOULD BE REPORTED IMMEDIATELY:  *FEVER GREATER THAN 100.5 F  *CHILLS WITH OR WITHOUT FEVER  NAUSEA AND VOMITING THAT IS NOT CONTROLLED WITH YOUR NAUSEA MEDICATION  *UNUSUAL SHORTNESS OF BREATH  *UNUSUAL BRUISING OR BLEEDING  TENDERNESS IN MOUTH AND THROAT WITH OR WITHOUT PRESENCE OF ULCERS  *URINARY PROBLEMS  *BOWEL PROBLEMS  UNUSUAL RASH Items with * indicate a potential emergency and should be followed up as soon as possible.  Feel free to call the clinic should you have any questions or concerns. The clinic phone number is [phone].  Please show the [place] at check-in to the Emergency Department and triage nurse.

## 2019-04-22 ENCOUNTER — Other Ambulatory Visit: Payer: Self-pay | Admitting: Internal Medicine

## 2019-04-22 DIAGNOSIS — G893 Neoplasm related pain (acute) (chronic): Secondary | ICD-10-CM

## 2019-05-06 ENCOUNTER — Telehealth: Payer: Self-pay | Admitting: Medical Oncology

## 2019-05-06 NOTE — Telephone Encounter (Signed)
Cough/congestion , Taking mucinex and not able to bring up any mucus. Per Dr [name] I instructed wife to tell pt to drink more fluids and if not better go to urgent care for covid testing. She voiced understanding.

## 2019-05-09 ENCOUNTER — Ambulatory Visit (INDEPENDENT_AMBULATORY_CARE_PROVIDER_SITE_OTHER): Payer: Medicare Other

## 2019-05-09 ENCOUNTER — Ambulatory Visit (HOSPITAL_COMMUNITY)
Admission: EM | Admit: 2019-05-09 | Discharge: 2019-05-09 | Disposition: A | Discharge status: Home / Self Care | Payer: Medicare Other | Attending: Emergency Medicine | Admitting: Emergency Medicine

## 2019-05-09 ENCOUNTER — Other Ambulatory Visit: Payer: Self-pay

## 2019-05-09 ENCOUNTER — Encounter (HOSPITAL_COMMUNITY): Payer: Self-pay | Admitting: Emergency Medicine

## 2019-05-09 DIAGNOSIS — R05 Cough: Secondary | ICD-10-CM | POA: Diagnosis not present

## 2019-05-09 DIAGNOSIS — R059 Cough, unspecified: Secondary | ICD-10-CM

## 2019-05-09 MED ORDER — PROMETHAZINE-DM 6.25-15 MG/5ML PO SYRP: 5.0000 mL | ORAL_SOLUTION | Freq: Three times a day (TID) | ORAL | 0 refills | Status: DC | PRN

## 2019-05-09 NOTE — ED Triage Notes (Signed)
Pt here for cough and congestion; pt currently receiving treatments with oncology; pt denies fever

## 2019-05-09 NOTE — ED Provider Notes (Signed)
[facility]    CSN: [id number] Arrival date & time: 05/09/19  1756     History   Chief Complaint Chief Complaint  Patient presents with   Cough    HPI Anthony Curry. is a 78 y.o. male history of stage IV lung cancer, hypertension, alcohol abuse presenting today for evaluation of a cough.  Patient states that he typically has a normal morning cough which is productive.  Over the past 4 to 5 days he has had a slightly worsened cough which she is unable to produce phlegm, but still continues to feel very congested.  He has been using over-the-counter Mucinex without relief.  He is concerned as he has a follow-up CT scan planned for Monday and he is unsure if they will let him have the scan with his cough.  He denies any close contacts that have been sick or any known exposure to [place].  He denies any fevers chills or body aches.  Otherwise he has felt fine besides unable to to cough up the mucus.  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 every 2 weeks last9-14-16 (Dr. Earlie Server), radiation last tx. 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   Acute urinary retention 09/09/2015   Choledocholithiasis with cholecystitis 09/08/2015   Common bile duct stone    Encounter for antineoplastic immunotherapy 06/28/2015   Atrial fibrillation ([place]) 06/11/2015   Gynecomastia, male 06/10/2015   Dizziness 05/11/2015   Malignant neoplasm of lower lobe of right lung ([place]) 09/17/2014   Neutropenic fever ([place]) 09/02/2014   Neoplastic malignant related fatigue 08/13/2014   Physical deconditioning 08/13/2014   Hypoalbuminemia 08/13/2014   Diarrhea 08/13/2014   Antineoplastic chemotherapy induced anemia(285.3) 08/13/2014   Atrial fibrillation with RVR ([place]) 07/28/2014   HCAP (healthcare-associated pneumonia) 07/25/2014   Alcoholism in remission ([place]) 07/25/2014   Atrial flutter by electrocardiogram ([place]) 07/25/2014   Swelling of limb 06/10/2014   Lung cancer ([place]) 10/15/2013   Hypotension 09/10/2013   Adrenal mass ([place]) 09/09/2013   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[place]) 09/09/2013   Pulmonary nodules 08/19/2013   Anemia of chronic disease 07/03/2013   BPH (benign prostatic hyperplasia) 07/03/2013   GERD (gastroesophageal reflux disease) 07/03/2013   Fall 07/03/2013   Alcohol intoxication ([place]) 07/03/2013   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   Alcohol abuse 10/15/2011   Hyponatremia 10/15/2011    Past Surgical History:  Procedure Laterality Date   ANKLE ARTHRODESIS  1995   rt fx-hardware in   CARDIOVERSION N/A 09/29/2014   Procedure: CARDIOVERSION;  Surgeon: Josue Hector, MD;  Location:  [facility];  Service: Cardiovascular;  Laterality: N/A;   CHOLECYSTECTOMY  09/08/2015    laproscopic    CHOLECYSTECTOMY N/A 09/08/2015   Procedure: LAPAROSCOPIC CHOLECYSTECTOMY WITH ATTEMPTED INTRAOPERATIVE CHOLANGIOGRAM ;  Surgeon: Fanny Skates, MD;  Location: [facility];  Service: General;  Laterality: N/A;   COLONOSCOPY     ERCP N/A 09/02/2015   Procedure: ENDOSCOPIC RETROGRADE  CHOLANGIOPANCREATOGRAPHY (ERCP);  Surgeon: Arta Silence, MD;  Location: [facility];  Service: Endoscopy;  Laterality: N/A;   ERCP N/A 09/07/2015   Procedure: ENDOSCOPIC RETROGRADE CHOLANGIOPANCREATOGRAPHY (ERCP);  Surgeon: Clarene Essex, MD;  Location: [facility];  Service: Endoscopy;  Laterality: N/A;   EXCISION / CURETTAGE BONE CYST FINGER  2005   rt thumb   HEMORRHOID SURGERY     Percutaneous Cholecytostomy tube     IVR insertion 06-13-15(Dr. Vernard Gambles)- 08-28-15 remains in place to drainage bag   TONSILLECTOMY     TOTAL SHOULDER ARTHROPLASTY  02/03/2012   Procedure: TOTAL SHOULDER ARTHROPLASTY;  Surgeon: [name], MD;  Location: [facility];  Service: Orthopedics;  Laterality: Left;       Home Medications    Prior to Admission medications   Medication Sig Start Date End Date Taking? Authorizing Provider  ALPRAZolam (XANAX) 0.25 MG tablet TAKE 1 TABLET(0.25 MG) BY MOUTH TWICE DAILY AS NEEDED FOR ANXIETY OR SLEEP 04/22/19   [name], MD  apixaban (ELIQUIS) 5 MG TABS tablet Take 5 mg by mouth 2 (two) times daily.    [provider]  Ascorbic Acid (VITAMIN C PO) Take 1,000 mg by mouth daily.     [provider]  B Complex-C (SUPER B COMPLEX PO) Take 1 each by mouth daily.     [provider]  calcium carbonate (OS-CAL) 600 MG TABS Take 600 mg by mouth daily with breakfast.    [provider]  demeclocycline (DECLOMYCIN) 150 MG tablet Take 150 mg by mouth 2 (two) times daily.    [provider]  diltiazem (CARDIZEM CD) 180 MG 24 hr capsule Take 1 capsule (180 mg total) by mouth daily at 12 noon. 01/09/15   Sherran Needs, NP  furosemide (LASIX) 40 MG tablet Take 0.5 tablets (20 mg total) by mouth every other day. 07/21/16   Sherran Needs, NP  HYDROcodone-acetaminophen (NORCO/VICODIN) 5-325 MG tablet Take 1 tablet by mouth every 6 (six) hours as needed for moderate pain. 04/16/19   [name], MD  KLOR-CON M20  20 MEQ tablet Take 20 mEq by mouth every morning.  10/18/14   [provider]  Lactobacillus (ACIDOPHILUS) CAPS capsule Take 1 capsule by mouth daily.    [provider]  levothyroxine (SYNTHROID, LEVOTHROID) 50 MCG tablet TAKE 1 TABLET (50 MCG TOTAL) BY MOUTH DAILY BEFORE BREAKFAST. 03/24/18   [name], MD  MAGNESIUM PO Take 400 mg by mouth daily.     [provider]  Multiple Vitamin (MULTIVITAMIN WITH MINERALS) TABS Take 1 tablet by mouth daily.    [provider]  Nivolumab (OPDIVO IV) Inject as directed every 2 weeks    [provider]  nivolumab 480 mg in sodium chloride 0.9 % 100 mL Patient states receives this only once a month, not Q 2 weeks.    [provider]  Nutritional Supplements (ENSURE COMPLETE PO) Take 1 Can by mouth 2 (two) times daily.    [provider]  Omega-3 Fatty Acids (FISH OIL PO) Take 1 tablet by mouth daily.    [provider]  pantoprazole (PROTONIX) 40 MG tablet Take 40 mg by mouth every morning. 06/02/15   [provider]  promethazine-dextromethorphan (PROMETHAZINE-DM) 6.25-15 MG/5ML syrup Take 5 mLs by mouth 3 (three) times daily as needed for cough. 05/09/19   Jedrek Dinovo C, PA-C  tamsulosin (FLOMAX) 0.4 MG CAPS capsule Take 0.4 mg by mouth daily at 12 noon.     [provider]  thiamine (VITAMIN B-1) 100 MG tablet Take 1 tablet (100 mg total) by mouth daily. 09/11/13   Erick Colace, NP  vitamin E 400 UNIT capsule Take 400 Units by mouth daily.    [provider]    Family History Family History  Problem Relation Age of Onset   Lung cancer Mother    Hypertension Father    Heart attack Father    Diabetes Paternal Grandmother    Brain cancer Brother    Heart attack Brother    Neuropathy Neg Hx     Social History Social History   Tobacco Use   Smoking status: Former Smoker    Packs/day: 2.00    Years: 30.00    Pack years: 60.00     Types: Cigarettes    Last attempt to quit: 12/05/1990    Years since quitting: 28.4   Smokeless tobacco: Never Used  Substance Use Topics   Alcohol use: Yes    Alcohol/week: 0.0 standard drinks    Comment: past hx.ETOH abuse,08-31-15 "occ. wine ,drinks non alcoholic beer occ."   Drug use: No     Allergies   Patient has no known allergies.   Review of Systems Review of Systems  Constitutional: Negative for activity change, appetite change, chills, fatigue and fever.  HENT: Positive for congestion. Negative for ear pain, rhinorrhea, sinus pressure, sore throat and trouble swallowing.   Eyes: Negative for discharge and redness.  Respiratory: Positive for cough. Negative for chest tightness and shortness of breath.   Cardiovascular: Negative for chest pain.  Gastrointestinal: Negative for abdominal pain, diarrhea, nausea and vomiting.  Musculoskeletal: Negative for myalgias.  Skin: Negative for rash.  Neurological: Negative for dizziness, light-headedness and headaches.     Physical Exam Triage Vital Signs ED Triage Vitals [05/09/19 1827]  Enc Vitals Group     BP (!) 175/73     Pulse Rate 99     Resp 18     Temp 98.6 F (37 C)     Temp Source Oral     SpO2 96 %     Weight      Height      Head Circumference      Peak Flow      Pain Score 0     Pain Loc      Pain Edu?      Excl. in [place]?    No data found.  Updated Vital Signs BP (!) 175/73 (BP Location: Right Arm)    Pulse 99    Temp 98.6 F (37 C) (Oral)    Resp 18    SpO2 96%   Visual Acuity Right Eye Distance:   Left Eye Distance:   Bilateral Distance:    Right Eye Near:   Left Eye Near:    Bilateral Near:     Physical Exam Vitals signs and nursing note reviewed.  Constitutional:      Appearance: He is well-developed.  HENT:     Head: Normocephalic and atraumatic.     Ears:     Comments: Bilateral ears without tenderness to palpation of  external auricle, tragus and mastoid, EAC's without erythema or  swelling, TM's with good bony landmarks and cone of light. Non erythematous.    Mouth/Throat:     Comments: Oral mucosa pink and moist, no tonsillar enlargement or exudate. Posterior pharynx patent and nonerythematous, no uvula deviation or swelling. Normal phonation.  Visible mucus present in posterior pharynx Eyes:     Conjunctiva/sclera: Conjunctivae normal.  Neck:     Musculoskeletal: Neck supple.  Cardiovascular:     Rate and Rhythm: Normal rate and regular rhythm.     Heart sounds: No murmur.  Pulmonary:     Effort: Pulmonary effort is normal. No respiratory distress.     Breath sounds: Normal breath sounds.     Comments: Breath sounds with crackles throughout lungs, slightly coarse breath sounds towards base of lungs Breathing comfortably at rest Abdominal:     Palpations: Abdomen is soft.     Tenderness: There is no abdominal tenderness.  Skin:    General: Skin is warm and dry.  Neurological:     Mental Status: He is alert.      UC Treatments / Results  Labs (all labs ordered are listed, but only abnormal results are displayed) Labs Reviewed - No data to display  EKG None  Radiology Dg Chest 2 View  Result Date: 05/09/2019 CLINICAL DATA:  Cough and congestion.  History of lung cancer. EXAM: CHEST - 2 VIEW COMPARISON:  CT chest dated February 18, 2019. Chest x-ray dated January 07, 2016. FINDINGS: The heart size and mediastinal contours are within normal limits. Atherosclerotic calcification of the aortic arch. Normal pulmonary vascularity. The lungs remain hyperinflated with emphysematous changes and scattered pleuroparenchymal scarring. No focal consolidation, pleural effusion, or pneumothorax. No acute osseous abnormality. Chronic right proximal humerus fracture and end-stage right glenohumeral osteoarthritis are again noted. Multiple old bilateral rib fractures. IMPRESSION: 1.  No active cardiopulmonary disease. 2. COPD. Electronically Signed   By: Titus Dubin M.D.    On: 05/09/2019 19:33    Procedures Procedures (including critical care time)  Medications Ordered in UC Medications - No data to display  Initial Impression / Assessment and Plan / UC Course  I have reviewed the triage vital signs and the nursing notes.  Pertinent labs & imaging results that were available during my care of the patient were reviewed by me and considered in my medical decision making (see chart for details).     Chest x-ray negative for pneumonia.  Recommending patient to have COVID testing to ensure that he is safe to have CT scanning on Monday.  Provided information to [place].  Provided Phenergan DM as alternative to help with cough.  Continue to monitor,Discussed strict return precautions. Patient verbalized understanding and is agreeable with plan.  Final Clinical Impressions(s) / UC Diagnoses   Final diagnoses:  Cough     Discharge Instructions     I will call you with the results of the chest xray  Begin phenergan DM to further help with cough- may cause some drowsiness, do not drive afterward  Someone will be in contact with you to set up COVID testing tomorrow [place]. is the address    ED Prescriptions    Medication Sig Dispense Auth. Provider   promethazine-dextromethorphan (PROMETHAZINE-DM) 6.25-15 MG/5ML syrup Take 5 mLs by mouth 3 (three) times daily as needed for cough. 118 mL Davis Vannatter C, PA-C     Controlled Substance Prescriptions [place] Controlled Substance Registry consulted? Not Applicable   Joneen Caraway,  Aarib Pulido C, PA-C 05/09/19 2026

## 2019-05-09 NOTE — Discharge Instructions (Signed)
I will call you with the results of the chest xray  Begin phenergan DM to further help with cough- may cause some drowsiness, do not drive afterward  Someone will be in contact with you to set up COVID testing tomorrow [place]. is the address

## 2019-05-10 ENCOUNTER — Telehealth: Payer: Self-pay | Admitting: *Deleted

## 2019-05-10 ENCOUNTER — Other Ambulatory Visit: Payer: Medicare Other

## 2019-05-10 ENCOUNTER — Telehealth: Payer: Self-pay

## 2019-05-10 DIAGNOSIS — R6889 Other general symptoms and signs: Secondary | ICD-10-CM | POA: Diagnosis not present

## 2019-05-10 DIAGNOSIS — Z20822 Contact with and (suspected) exposure to covid-19: Secondary | ICD-10-CM

## 2019-05-10 NOTE — Telephone Encounter (Signed)
TCT to [facility] 19 testing site. Spoke with one of the triage nurses to relay pt's correct phone # so he can be called to schedule Covid 19 test.  RN stated she would call him today on the correct #

## 2019-05-10 NOTE — Telephone Encounter (Signed)
Telephone call to Patient .  Patient scheduled for testing at 2:00pm per his request.  Appointment is at @2 :pm at [facility] location .  Patient voiced understanding.

## 2019-05-10 NOTE — Telephone Encounter (Addendum)
Attempted to contact pt to arrange COVID testing; pre-recorded message states customer is not available.   ----- Message from Janith Lima, PA-C sent at 05/09/2019  7:12 PM EDT ----- Regarding: Needs COVID Testing Cough x 4-5 days, lung cancer patient, with upcoming CT on Monday, testing to evaluate if patient may proceed with scan.

## 2019-05-10 NOTE — Telephone Encounter (Signed)
See other telephone encounter for 05/10/19. Pt scheduled for testing on 05/10/19.

## 2019-05-10 NOTE — Telephone Encounter (Signed)
Received call from patient stating that he has developed a cough that is ongoing. He went to [facility] for eval.  See note from that visit on 05/09/19 Pt is calling as he has not heard from [facility] regarding testing time.  Noted that an attempt was made to call patient but perhaps a different phone # was used.  Please use this # [phone]  Pt is expecting a call

## 2019-05-10 NOTE — Telephone Encounter (Signed)
Patient called back to set up COVID testing.  Patient states that he would like to get in done this weekend due to having CT appt on Monday. Call back # [phone] Mobile  [phone] Home

## 2019-05-13 ENCOUNTER — Other Ambulatory Visit: Payer: Self-pay | Admitting: Medical Oncology

## 2019-05-13 ENCOUNTER — Ambulatory Visit (HOSPITAL_COMMUNITY)
Admission: RE | Admit: 2019-05-13 | Discharge: 2019-05-13 | Disposition: A | Discharge status: Home / Self Care | Payer: Medicare Other | Source: Ambulatory Visit | Attending: Internal Medicine | Admitting: Internal Medicine

## 2019-05-13 ENCOUNTER — Other Ambulatory Visit: Payer: Self-pay

## 2019-05-13 DIAGNOSIS — K7689 Other specified diseases of liver: Secondary | ICD-10-CM | POA: Diagnosis not present

## 2019-05-13 DIAGNOSIS — C3490 Malignant neoplasm of unspecified part of unspecified bronchus or lung: Secondary | ICD-10-CM | POA: Diagnosis not present

## 2019-05-13 DIAGNOSIS — R5382 Chronic fatigue, unspecified: Secondary | ICD-10-CM

## 2019-05-13 DIAGNOSIS — C3411 Malignant neoplasm of upper lobe, right bronchus or lung: Secondary | ICD-10-CM | POA: Diagnosis not present

## 2019-05-13 DIAGNOSIS — C3431 Malignant neoplasm of lower lobe, right bronchus or lung: Secondary | ICD-10-CM

## 2019-05-13 LAB — NOVEL CORONAVIRUS, NAA: SARS-CoV-2, NAA: NOT DETECTED

## 2019-05-13 MED ORDER — SODIUM CHLORIDE (PF) 0.9 % IJ SOLN
INTRAMUSCULAR | Status: AC
  Filled 2019-05-13: qty 50

## 2019-05-13 MED ORDER — IOHEXOL 300 MG/ML  SOLN
100.0000 mL | Freq: Once | INTRAMUSCULAR | Status: AC | PRN
  Administered 2019-05-13: 10:00:00 100 mL via INTRAVENOUS

## 2019-05-14 ENCOUNTER — Other Ambulatory Visit: Payer: Self-pay | Admitting: Medical Oncology

## 2019-05-14 ENCOUNTER — Other Ambulatory Visit: Payer: Self-pay

## 2019-05-14 ENCOUNTER — Encounter: Payer: Self-pay | Admitting: Internal Medicine

## 2019-05-14 ENCOUNTER — Telehealth: Payer: Self-pay | Admitting: Medical Oncology

## 2019-05-14 ENCOUNTER — Inpatient Hospital Stay: Payer: Medicare Other

## 2019-05-14 ENCOUNTER — Inpatient Hospital Stay (HOSPITAL_BASED_OUTPATIENT_CLINIC_OR_DEPARTMENT_OTHER): Payer: Medicare Other | Admitting: Internal Medicine

## 2019-05-14 ENCOUNTER — Inpatient Hospital Stay: Payer: Medicare Other | Attending: Internal Medicine

## 2019-05-14 VITALS — BP 151/75 | HR 88

## 2019-05-14 VITALS — BP 191/86 | HR 90 | Temp 98.3°F | Resp 22 | Ht 71.0 in | Wt 141.1 lb

## 2019-05-14 DIAGNOSIS — I10 Essential (primary) hypertension: Secondary | ICD-10-CM

## 2019-05-14 DIAGNOSIS — G893 Neoplasm related pain (acute) (chronic): Secondary | ICD-10-CM

## 2019-05-14 DIAGNOSIS — Z79899 Other long term (current) drug therapy: Secondary | ICD-10-CM | POA: Insufficient documentation

## 2019-05-14 DIAGNOSIS — R05 Cough: Secondary | ICD-10-CM | POA: Diagnosis not present

## 2019-05-14 DIAGNOSIS — Z5112 Encounter for antineoplastic immunotherapy: Secondary | ICD-10-CM

## 2019-05-14 DIAGNOSIS — C3490 Malignant neoplasm of unspecified part of unspecified bronchus or lung: Secondary | ICD-10-CM

## 2019-05-14 DIAGNOSIS — R5382 Chronic fatigue, unspecified: Secondary | ICD-10-CM

## 2019-05-14 DIAGNOSIS — C3431 Malignant neoplasm of lower lobe, right bronchus or lung: Secondary | ICD-10-CM | POA: Diagnosis not present

## 2019-05-14 DIAGNOSIS — R0981 Nasal congestion: Secondary | ICD-10-CM | POA: Diagnosis not present

## 2019-05-14 DIAGNOSIS — F419 Anxiety disorder, unspecified: Secondary | ICD-10-CM

## 2019-05-14 DIAGNOSIS — I119 Hypertensive heart disease without heart failure: Secondary | ICD-10-CM

## 2019-05-14 LAB — CBC WITH DIFFERENTIAL (CANCER CENTER ONLY)
Abs Immature Granulocytes: 0.02 10*3/uL (ref 0.00–0.07)
Basophils Absolute: 0 10*3/uL (ref 0.0–0.1)
Basophils Relative: 1 %
Eosinophils Absolute: 0.3 10*3/uL (ref 0.0–0.5)
Eosinophils Relative: 4 %
HCT: 42.8 % (ref 39.0–52.0)
Hemoglobin: 14.8 g/dL (ref 13.0–17.0)
Immature Granulocytes: 0 %
Lymphocytes Relative: 16 %
Lymphs Abs: 1.1 10*3/uL (ref 0.7–4.0)
MCH: 31.8 pg (ref 26.0–34.0)
MCHC: 34.6 g/dL (ref 30.0–36.0)
MCV: 91.8 fL (ref 80.0–100.0)
Monocytes Absolute: 0.9 10*3/uL (ref 0.1–1.0)
Monocytes Relative: 13 %
Neutro Abs: 4.4 10*3/uL (ref 1.7–7.7)
Neutrophils Relative %: 66 %
Platelet Count: 230 10*3/uL (ref 150–400)
RBC: 4.66 MIL/uL (ref 4.22–5.81)
RDW: 14.2 % (ref 11.5–15.5)
WBC Count: 6.7 10*3/uL (ref 4.0–10.5)
nRBC: 0 % (ref 0.0–0.2)

## 2019-05-14 LAB — CMP (CANCER CENTER ONLY)
ALT: 16 U/L (ref 0–44)
AST: 25 U/L (ref 15–41)
Albumin: 4.1 g/dL (ref 3.5–5.0)
Alkaline Phosphatase: 102 U/L (ref 38–126)
Anion gap: 9 (ref 5–15)
BUN: 13 mg/dL (ref 8–23)
CO2: 26 mmol/L (ref 22–32)
Calcium: 9.4 mg/dL (ref 8.9–10.3)
Chloride: 97 mmol/L — ABNORMAL LOW (ref 98–111)
Creatinine: 0.8 mg/dL (ref 0.61–1.24)
GFR, Est AFR Am: >60 mL/min (ref >60)
GFR, Estimated: >60 mL/min (ref >60)
Glucose, Bld: 89 mg/dL (ref 70–99)
Potassium: 4.9 mmol/L (ref 3.5–5.1)
Sodium: 132 mmol/L — ABNORMAL LOW (ref 135–145)
Total Bilirubin: 0.8 mg/dL (ref 0.3–1.2)
Total Protein: 6.5 g/dL (ref 6.5–8.1)

## 2019-05-14 LAB — TSH: TSH: 2.389 u[IU]/mL (ref 0.320–4.118)

## 2019-05-14 MED ORDER — CLONIDINE HCL 0.1 MG PO TABS: 0.2000 mg | ORAL_TABLET | Freq: Once | ORAL | Status: DC

## 2019-05-14 MED ORDER — HYDROCODONE-ACETAMINOPHEN 5-325 MG PO TABS: 1.0000 | ORAL_TABLET | Freq: Four times a day (QID) | ORAL | 0 refills | Status: DC | PRN

## 2019-05-14 MED ORDER — ALPRAZOLAM 0.25 MG PO TABS: 0.2500 mg | ORAL_TABLET | Freq: Every evening | ORAL | 0 refills | Status: DC | PRN

## 2019-05-14 MED ORDER — CLONIDINE HCL 0.1 MG PO TABS: 0.1000 mg | ORAL_TABLET | ORAL | Status: DC

## 2019-05-14 MED ORDER — SODIUM CHLORIDE 0.9 % IV SOLN
Freq: Once | INTRAVENOUS | Status: AC
  Administered 2019-05-14: 12:00:00 via INTRAVENOUS
  Filled 2019-05-14: qty 250

## 2019-05-14 MED ORDER — CLONIDINE HCL 0.1 MG PO TABS
0.1000 mg | ORAL_TABLET | ORAL | Status: AC
  Administered 2019-05-14: 0.1 mg via ORAL

## 2019-05-14 MED ORDER — SODIUM CHLORIDE 0.9 % IV SOLN
480.0000 mg | Freq: Once | INTRAVENOUS | Status: AC
  Administered 2019-05-14: 480 mg via INTRAVENOUS
  Filled 2019-05-14: qty 48

## 2019-05-14 MED ORDER — CLONIDINE HCL 0.1 MG PO TABS
ORAL_TABLET | ORAL | Status: AC
  Filled 2019-05-14: qty 2

## 2019-05-14 NOTE — Telephone Encounter (Signed)
Prescription for scooter left up front -wife stated she was coming to get it.

## 2019-05-14 NOTE — Progress Notes (Signed)
[place] [phone]   [phone]) [phone]  OFFICE PROGRESS NOTE  [name], MD [place] [id number]  DIAGNOSIS: Stage IV (T1a, N0, M1b) non-small cell lung cancer, adenocarcinoma of the right lower lobe diagnosed in October 2014  Molecular profile: Negative forRET, ALK, BRAF, MET, EGFR.  Positive for ERBB2 A775T, [id number], KRAS G13E, MAP2K1 D67N (see full report).   PRIOR THERAPY:  1) Systemic chemotherapy with carboplatin for AUC of 5 and Alimta 500 mg/M2 every 3 weeks, status post 6 cycles with stable disease. First dose on 10/23/2013. 2) Maintenance systemic chemotherapy with single agent Alimta 500 mg/M2 every 3 weeks. Status post 8 cycles.  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 Sondra Come completed on 12/16/2014. 4) stereotactic radiotherapy to the enlarging right upper lobe lung nodule under the care of Dr. Sondra Come completed on 11/22/2016. 5) Immunotherapy with Nivolumab 240 mg IV every 2 weeks, status post 60 cycles. First cycle was given on 01/28/2015. Last dose 07/05/2017.  CURRENT THERAPY: Immunotherapy with Nivolumab 480 mg IV every 4 weeks. First dose 07/19/2017. Status post 23 cycles.  INTERVAL HISTORY: Anthony Curry. 78 y.o. male returns to the clinic today for follow-up visit.  The patient is feeling fine except for anxiety about the scan results.  His blood pressure is elevated today.  He denied having any current chest pain, shortness of breath but continues to have cough secondary to postnasal drainage.  He has no hemoptysis.  She denied having any recent weight loss or night sweats.  He has no headache or visual changes.  The patient has no fever or chills.  He had repeat CT scan of the chest, abdomen pelvis performed recently and he is here for evaluation and discussion of his scan results.  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 every 2 weeks last9-14-16 (Dr. Earlie Server), radiation last tx. 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  MEDICATIONS:  Current Outpatient Medications  Medication Sig Dispense Refill   ALPRAZolam (XANAX) 0.25 MG tablet TAKE 1 TABLET(0.25 MG) BY MOUTH TWICE DAILY AS NEEDED FOR ANXIETY OR SLEEP 30 tablet 0   apixaban (ELIQUIS) 5 MG TABS tablet Take 5 mg by mouth 2 (two) times daily.     Ascorbic Acid (VITAMIN C PO) Take 1,000 mg by mouth daily.      B Complex-C (SUPER B COMPLEX PO) Take 1 each by mouth daily.      calcium carbonate (OS-CAL) 600 MG TABS Take 600 mg by mouth daily with breakfast.     demeclocycline (  DECLOMYCIN) 150 MG tablet Take 150 mg by mouth 2 (two) times daily.     diltiazem (CARDIZEM CD) 180 MG 24 hr capsule Take 1 capsule (180 mg total) by mouth daily at 12 noon. 90 capsule 3   furosemide (LASIX) 40 MG tablet Take 0.5 tablets (20 mg total) by mouth every other day.     HYDROcodone-acetaminophen (NORCO/VICODIN) 5-325 MG tablet Take 1 tablet by mouth every 6 (six) hours as needed for moderate pain. 40 tablet 0   KLOR-CON M20 20 MEQ tablet Take 20 mEq by mouth every morning.      Lactobacillus (ACIDOPHILUS) CAPS  capsule Take 1 capsule by mouth daily.     levothyroxine (SYNTHROID, LEVOTHROID) 50 MCG tablet TAKE 1 TABLET (50 MCG TOTAL) BY MOUTH DAILY BEFORE BREAKFAST. 90 tablet 0   MAGNESIUM PO Take 400 mg by mouth daily.      Multiple Vitamin (MULTIVITAMIN WITH MINERALS) TABS Take 1 tablet by mouth daily.     Nivolumab (OPDIVO IV) Inject as directed every 2 weeks     nivolumab 480 mg in sodium chloride 0.9 % 100 mL Patient states receives this only once a month, not Q 2 weeks.     Nutritional Supplements (ENSURE COMPLETE PO) Take 1 Can by mouth 2 (two) times daily.     Omega-3 Fatty Acids (FISH OIL PO) Take 1 tablet by mouth daily.     pantoprazole (PROTONIX) 40 MG tablet Take 40 mg by mouth every morning.  8   promethazine-dextromethorphan (PROMETHAZINE-DM) 6.25-15 MG/5ML syrup Take 5 mLs by mouth 3 (three) times daily as needed for cough. 118 mL 0   tamsulosin (FLOMAX) 0.4 MG CAPS capsule Take 0.4 mg by mouth daily at 12 noon.      thiamine (VITAMIN B-1) 100 MG tablet Take 1 tablet (100 mg total) by mouth daily. 30 tablet 6   vitamin E 400 UNIT capsule Take 400 Units by mouth daily.     No current facility-administered medications for this visit.     SURGICAL HISTORY:  Past Surgical History:  Procedure Laterality Date   ANKLE ARTHRODESIS  1995   rt fx-hardware in   CARDIOVERSION N/A 09/29/2014   Procedure: CARDIOVERSION;  Surgeon: Josue Hector, MD;  Location: [facility] END[facility]COPY;  Service: Cardiovascular;  Laterality: N/A;   CHOLECYSTECTOMY  09/08/2015    laproscopic    CHOLECYSTECTOMY N/A 09/08/2015   Procedure: LAPAROSCOPIC CHOLECYSTECTOMY WITH ATTEMPTED INTRAOPERATIVE CHOLANGIOGRAM ;  Surgeon: Fanny Skates, MD;  Location: [facility];  Service: General;  Laterality: N/A;   COLONOSCOPY     ERCP N/A 09/02/2015   Procedure: ENDOSCOPIC RETROGRADE CHOLANGIOPANCREATOGRAPHY (ERCP);  Surgeon: Arta Silence, MD;  Location: [facility] ENDOS[facility];  Service: Endoscopy;  Laterality: N/A;   ERCP N/A  09/07/2015   Procedure: ENDOSCOPIC RETROGRADE CHOLANGIOPANCREATOGRAPHY (ERCP);  Surgeon: Clarene Essex, MD;  Location: [facility] ENDOSCOPY;  Service: Endoscopy;  Laterality: N/A;   EXCISION / CURETTAGE BONE CYST FINGER  2005   rt thumb   HEMORRHOID SURGERY     Percutaneous Cholecytostomy tube     IVR insertion 06-13-15(Dr. Vernard Gambles)- 08-28-15 remains in place to drainage bag   TONSILLECTOMY     TOTAL SHOULDER ARTHROPLASTY  02/03/2012   Procedure: TOTAL SHOULDER ARTHROPLASTY;  Surgeon: [name], MD;  Location: [facility];  Service: Orthopedics;  Laterality: Left;    REVIEW OF SYSTEMS:  Constitutional: negative Eyes: negative Ears, nose, mouth, throat, and face: negative Respiratory: positive for cough Cardiovascular: negative Gastrointestinal: negative Genitourinary:negative Integument/breast: negative Hematologic/lymphatic: negative  Musculoskeletal:negative Neurological: negative Behavioral/Psych: negative Endocrine: negative Allergic/Immunologic: negative   PHYSICAL EXAMINATION: General appearance: alert, cooperative, fatigued and no distress Head: Normocephalic, without obvious abnormality, atraumatic Neck: no adenopathy, no JVD, supple, symmetrical, trachea midline and thyroid not enlarged, symmetric, no tenderness/mass/nodules Lymph nodes: Cervical, supraclavicular, and axillary nodes normal. Resp: clear to auscultation bilaterally Back: symmetric, no curvature. ROM normal. No CVA tenderness. Cardio: regular rate and rhythm, S1, S2 normal, no murmur, click, rub or gallop GI: soft, non-tender; bowel sounds normal; no masses,  no organomegaly Extremities: extremities normal, atraumatic, no cyanosis or edema Neurologic: Alert and oriented X 3, normal strength and tone. Normal symmetric reflexes. Normal coordination and gait  ECOG PERFORMANCE STATUS: 1 - Symptomatic but completely ambulatory  Blood pressure (!) 191/86, pulse 90, temperature 98.3 F (36.8 C),  temperature source Oral, resp. rate (!) 22, height 5' 11"  (1.803 m), weight 141 lb 1.6 oz (64 kg), SpO2 100 %.  LABORATORY DATA: Lab Results  Component Value Date   WBC 6.7 05/14/2019   HGB 14.8 05/14/2019   HCT 42.8 05/14/2019   MCV 91.8 05/14/2019   PLT 230 05/14/2019      Chemistry      Component Value Date/Time   NA 136 04/16/2019 1104   NA 132 (L) 12/06/2017 0930   K 3.8 04/16/2019 1104   K 4.0 12/06/2017 0930   CL 102 04/16/2019 1104   CO2 25 04/16/2019 1104   CO2 26 12/06/2017 0930   BUN 18 04/16/2019 1104   BUN 15.4 12/06/2017 0930   CREATININE 0.93 04/16/2019 1104   CREATININE 0.9 12/06/2017 0930      Component Value Date/Time   CALCIUM 9.5 04/16/2019 1104   CALCIUM 9.9 12/06/2017 0930   ALKPHOS 98 04/16/2019 1104   ALKPHOS 107 12/06/2017 0930   AST 18 04/16/2019 1104   AST 20 12/06/2017 0930   ALT 17 04/16/2019 1104   ALT 14 12/06/2017 0930   BILITOT 0.6 04/16/2019 1104   BILITOT 0.76 12/06/2017 0930       RADIOGRAPHIC STUDIES: Dg Chest 2 View  Result Date: 05/09/2019 CLINICAL DATA:  Cough and congestion.  History of lung cancer. EXAM: CHEST - 2 VIEW COMPARISON:  CT chest dated February 18, 2019. Chest x-ray dated January 07, 2016. FINDINGS: The heart size and mediastinal contours are within normal limits. Atherosclerotic calcification of the aortic arch. Normal pulmonary vascularity. The lungs remain hyperinflated with emphysematous changes and scattered pleuroparenchymal scarring. No focal consolidation, pleural effusion, or pneumothorax. No acute osseous abnormality. Chronic right proximal humerus fracture and end-stage right glenohumeral osteoarthritis are again noted. Multiple old bilateral rib fractures. IMPRESSION: 1.  No active cardiopulmonary disease. 2. COPD. Electronically Signed   By: Titus Dubin M.D.   On: 05/09/2019 19:33   Ct Chest W Contrast  Result Date: 05/13/2019 CLINICAL DATA:  Right lung cancer. EXAM: CT CHEST, ABDOMEN, AND PELVIS WITH  CONTRAST TECHNIQUE: Multidetector CT imaging of the chest, abdomen and pelvis was performed following the standard protocol during bolus administration of intravenous contrast. CONTRAST:  127m OMNIPAQUE IOHEXOL 300 MG/ML  SOLN COMPARISON:  02/18/2019 in 05/09/2017. FINDINGS: CT CHEST FINDINGS Cardiovascular: Atherosclerotic calcification of the aorta and coronary arteries. Heart size normal. Small amount pericardial fluid is similar. Mediastinum/Nodes: No pathologically enlarged mediastinal hilar or axillary lymph nodes. Esophagus is grossly unremarkable. Lungs/Pleura: Centrilobular and paraseptal emphysema. Scarring in both upper lobes, unchanged. Part solid mass in the posterior aspect of the right upper lobe measures 1.8 x 3.2 cm (series 6, image  56) with an internal solid component measuring 9 mm (54). When compared with 05/09/2017, lesion has increased slightly in size from 1.9 x 2.4 cm and the solid component appears new. Slight peripheral thickening of a focal bed of emphysema in the right lower lobe (series 6, image 91), measuring 1.8 cm. When compared with 05/09/2017, thickening is new. 11 mm sub solid nodule in the left upper lobe (51) has enlarged from 6 mm on 05/09/2017. Multiple additional areas of ill-defined ground-glass bilaterally, as on prior exams. 4 mm nodule in the anterior right lower lobe is indicative of a benign subpleural lymph node, chronically stable. No pleural fluid. Airway is unremarkable. Musculoskeletal: Extensive degenerative changes in the spine. Old rib fractures. Severe degenerative change and healed fracture involving the right shoulder joint and proximal humerus, respectively. Left shoulder arthroplasty. CT ABDOMEN PELVIS FINDINGS Hepatobiliary: Low-attenuation lesions in the liver measure up to 1.7 cm in the inferior right hepatic lobe, as before. Cholecystectomy. Air is seen in the common bile duct indicative of sphincter of Oddi dysfunction. No biliary ductal dilatation.  Pancreas: Negative. Spleen: Negative. Adrenals/Urinary Tract: Slight thickening of the right adrenal gland. Left adrenal gland is unremarkable. Scarring in both kidneys. Subcentimeter low-attenuation lesion in the lower pole left kidney is too small to definitively characterize but statistically, a cyst is likely. Ureters are decompressed. Bladder wall trabeculation. Stomach/Bowel: Stomach, small bowel, appendix and colon are unremarkable. Vascular/Lymphatic: Atherosclerotic calcification of the aorta without aneurysm. No pathologically enlarged lymph nodes. Reproductive: Prostate is visualized. Other: No free fluid.  Mesenteries and peritoneum are unremarkable. Musculoskeletal: Probable bone islands in the pelvis. Lytic lesion in right iliac wing measures 11 mm, as on 05/09/2017. Marked degenerative changes in the spine. IMPRESSION: 1. Sub solid pulmonary nodules have progressed from 05/09/2017, as discussed above. Low-grade adenocarcinoma cannot be excluded. Continued attention on follow-up exams is warranted. 2. Aortic atherosclerosis (ICD10-170.0). Coronary artery calcification. 3.  Emphysema (ICD10-J43.9). Electronically Signed   By: Lorin Picket M.D.   On: 05/13/2019 10:54   Ct Abdomen Pelvis W Contrast  Result Date: 05/13/2019 CLINICAL DATA:  Right lung cancer. EXAM: CT CHEST, ABDOMEN, AND PELVIS WITH CONTRAST TECHNIQUE: Multidetector CT imaging of the chest, abdomen and pelvis was performed following the standard protocol during bolus administration of intravenous contrast. CONTRAST:  141m OMNIPAQUE IOHEXOL 300 MG/ML  SOLN COMPARISON:  02/18/2019 in 05/09/2017. FINDINGS: CT CHEST FINDINGS Cardiovascular: Atherosclerotic calcification of the aorta and coronary arteries. Heart size normal. Small amount pericardial fluid is similar. Mediastinum/Nodes: No pathologically enlarged mediastinal hilar or axillary lymph nodes. Esophagus is grossly unremarkable. Lungs/Pleura: Centrilobular and paraseptal  emphysema. Scarring in both upper lobes, unchanged. Part solid mass in the posterior aspect of the right upper lobe measures 1.8 x 3.2 cm (series 6, image 56) with an internal solid component measuring 9 mm (54). When compared with 05/09/2017, lesion has increased slightly in size from 1.9 x 2.4 cm and the solid component appears new. Slight peripheral thickening of a focal bed of emphysema in the right lower lobe (series 6, image 91), measuring 1.8 cm. When compared with 05/09/2017, thickening is new. 11 mm sub solid nodule in the left upper lobe (51) has enlarged from 6 mm on 05/09/2017. Multiple additional areas of ill-defined ground-glass bilaterally, as on prior exams. 4 mm nodule in the anterior right lower lobe is indicative of a benign subpleural lymph node, chronically stable. No pleural fluid. Airway is unremarkable. Musculoskeletal: Extensive degenerative changes in the spine. Old rib fractures. Severe degenerative change and  healed fracture involving the right shoulder joint and proximal humerus, respectively. Left shoulder arthroplasty. CT ABDOMEN PELVIS FINDINGS Hepatobiliary: Low-attenuation lesions in the liver measure up to 1.7 cm in the inferior right hepatic lobe, as before. Cholecystectomy. Air is seen in the common bile duct indicative of sphincter of Oddi dysfunction. No biliary ductal dilatation. Pancreas: Negative. Spleen: Negative. Adrenals/Urinary Tract: Slight thickening of the right adrenal gland. Left adrenal gland is unremarkable. Scarring in both kidneys. Subcentimeter low-attenuation lesion in the lower pole left kidney is too small to definitively characterize but statistically, a cyst is likely. Ureters are decompressed. Bladder wall trabeculation. Stomach/Bowel: Stomach, small bowel, appendix and colon are unremarkable. Vascular/Lymphatic: Atherosclerotic calcification of the aorta without aneurysm. No pathologically enlarged lymph nodes. Reproductive: Prostate is visualized.  Other: No free fluid.  Mesenteries and peritoneum are unremarkable. Musculoskeletal: Probable bone islands in the pelvis. Lytic lesion in right iliac wing measures 11 mm, as on 05/09/2017. Marked degenerative changes in the spine. IMPRESSION: 1. Sub solid pulmonary nodules have progressed from 05/09/2017, as discussed above. Low-grade adenocarcinoma cannot be excluded. Continued attention on follow-up exams is warranted. 2. Aortic atherosclerosis (ICD10-170.0). Coronary artery calcification. 3.  Emphysema (ICD10-J43.9). Electronically Signed   By: Lorin Picket M.D.   On: 05/13/2019 10:54    ASSESSMENT AND PLAN:  This is a very pleasant 78 years old white male with metastatic non-small cell lung cancer, adenocarcinoma and currently on treatment with second line immunotherapy with Nivolumab status post 60 cycles and has been tolerating his treatment well. He was recently switched to Nivolumab 480 MG IV every 4 weeks status post 23 cycles. The patient continues to tolerate this treatment well with no concerning adverse effects. He had repeat CT scan of the chest, abdomen pelvis performed recently.  I personally and independently reviewed the scan images and discussed the results with the patient today. His a scan showed no concerning findings for disease progression and there was some mild increase in some of the pulmonary nodules.  I recommended for the patient to continue with his current treatment with nivolumab every 4 weeks as planned. He will proceed with cycle #24 today. I will see him back for follow-up visit in 4 weeks for evaluation before the next cycle of his treatment. For hypertension I gave him a dose of clonidine 0.2 mg p.o. x1 and he was advised to take his blood pressure medication as prescribed and to discuss with his primary care physician for adjustment of his medication if needed. The patient was advised to quit immediately if he has any concerning symptoms in the past. I gave him  refill of his pain medication and Xanax. The patient voices understanding of current disease status and treatment options and is in agreement with the current care plan. All questions were answered. The patient knows to call the clinic with any problems, questions or concerns. We can certainly see the patient much sooner if necessary.  Disclaimer: This note was dictated with voice recognition software. Similar sounding words can inadvertently be transcribed and may not be corrected upon review.

## 2019-05-14 NOTE — Patient Instructions (Signed)
[facility] Discharge Instructions for Patients Receiving Chemotherapy  Today you received the following Immunotherapy agent: Opdivo  To help prevent nausea and vomiting after your treatment, we encourage you to take your nausea medication as directed by your MD.   If you develop nausea and vomiting that is not controlled by your nausea medication, call the clinic.   BELOW ARE SYMPTOMS THAT SHOULD BE REPORTED IMMEDIATELY:  *FEVER GREATER THAN 100.5 F  *CHILLS WITH OR WITHOUT FEVER  NAUSEA AND VOMITING THAT IS NOT CONTROLLED WITH YOUR NAUSEA MEDICATION  *UNUSUAL SHORTNESS OF BREATH  *UNUSUAL BRUISING OR BLEEDING  TENDERNESS IN MOUTH AND THROAT WITH OR WITHOUT PRESENCE OF ULCERS  *URINARY PROBLEMS  *BOWEL PROBLEMS  UNUSUAL RASH Items with * indicate a potential emergency and should be followed up as soon as possible.  Feel free to call the clinic should you have any questions or concerns. The clinic phone number is [phone].  Please show the [facility] at check-in to the Emergency Department and triage nurse.

## 2019-05-14 NOTE — Progress Notes (Signed)
Nutrition Follow-up:   Patient with lung cancer followed by Dr. Earlie Server.  Patient receiving nivolumab.    Spoke with patient today after infusion.  Patient reports that his appetite is about the same.  Reports that he has been worried about upcoming scan and results of scan.  Reports that he eats when he is hungry.  Continues to drink ensure.  "I am doing what I can do." Requesting more ensure.      Medications: reivewed  Labs: reviewed  Anthropometrics:   Weight is 141 lb 1.6 oz today slight decrease from 143 lb on 5/12 but overall stable   NUTRITION DIAGNOSIS: Increased nutrient needs continue   INTERVENTION:  Provided patient another case of ensure enlive today with coupons at his request.   Encouraged high calorie, high protein foods. Contact information provided and patient will contact RD if needed in the future    MONITORING, EVALUATION, GOAL: Patient will consume adequate calories and protein to maintain weight   NEXT VISIT: patient to contact  Anthony Curry, [place], [place] Registered Dietitian [phone] (pager)

## 2019-05-15 ENCOUNTER — Telehealth: Payer: Self-pay | Admitting: Internal Medicine

## 2019-05-15 ENCOUNTER — Telehealth: Payer: Self-pay | Admitting: Medical Oncology

## 2019-05-15 ENCOUNTER — Other Ambulatory Visit: Payer: Self-pay | Admitting: Medical Oncology

## 2019-05-15 DIAGNOSIS — R05 Cough: Secondary | ICD-10-CM

## 2019-05-15 DIAGNOSIS — R059 Cough, unspecified: Secondary | ICD-10-CM

## 2019-05-15 DIAGNOSIS — C3431 Malignant neoplasm of lower lobe, right bronchus or lung: Secondary | ICD-10-CM

## 2019-05-15 MED ORDER — AZITHROMYCIN 250 MG PO TABS: ORAL_TABLET | ORAL | 0 refills | Status: DC

## 2019-05-15 NOTE — Telephone Encounter (Signed)
Added additional cycle per 6/09 los - pt to get an updated schedule next visit.

## 2019-05-15 NOTE — Telephone Encounter (Signed)
Persistent cough /Emphysema -what can be done for the emphysema and the cough. Cough sounds wet, ?bronchitis.

## 2019-05-15 NOTE — Telephone Encounter (Signed)
We can try Z pak but he may need to see his PCP or Pulmonary if he has one.

## 2019-05-17 ENCOUNTER — Telehealth: Payer: Self-pay | Admitting: Medical Oncology

## 2019-05-17 NOTE — Telephone Encounter (Signed)
Dizziness and unsteadiness resolved after drinking coconut water. He is still on zpak and still coughing . I told him to call back Monday with update on cough . If cough still present then [facility] wants to refer him to a pulmonologist.

## 2019-05-24 ENCOUNTER — Telehealth: Payer: Self-pay | Admitting: Medical Oncology

## 2019-05-24 NOTE — Telephone Encounter (Signed)
Cough still persists after Zpak.  I don't think it is from my lungs. Had postnasal drip all adult life. Takes mucinex and cough dries up and when it wears off cough is back.  I told him [name] may refer  him to pulmonologist.  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 Anything he can do about it? ?refer [facility]

## 2019-05-25 NOTE — Telephone Encounter (Signed)
Nothing else for Neuropathy. He was seen by both Neurology and [facility] in the past. No better recommendation.

## 2019-05-27 ENCOUNTER — Telehealth: Payer: Self-pay | Admitting: *Deleted

## 2019-05-27 NOTE — Telephone Encounter (Signed)
Received vm message from patient. He states his coughing has resolved after he was able to cough up large mucous plug over the weekend. He states he feels much better-no more coughing.  No  Need for call back at this time.

## 2019-05-28 ENCOUNTER — Other Ambulatory Visit: Payer: Self-pay | Admitting: Internal Medicine

## 2019-05-28 DIAGNOSIS — G893 Neoplasm related pain (acute) (chronic): Secondary | ICD-10-CM

## 2019-05-29 ENCOUNTER — Telehealth: Payer: Self-pay | Admitting: Medical Oncology

## 2019-05-29 NOTE — Telephone Encounter (Signed)
xanax quantity was #60 in may and this month it was for #30 . Why the change? He takes 2/day prn anxiety.( written like this in may) He is not sleeping due to worrying about things

## 2019-05-30 ENCOUNTER — Telehealth: Payer: Self-pay | Admitting: Medical Oncology

## 2019-05-30 NOTE — Telephone Encounter (Addendum)
XanaxPt notified he can pick up xanax #10 tablets today and to only take it once a day as needed for anxiety .  Alcohol- I instructed pt to not drink alcohol and he said he has not had a glass of wine in two weeks.

## 2019-06-10 ENCOUNTER — Other Ambulatory Visit: Payer: Self-pay | Admitting: Internal Medicine

## 2019-06-10 DIAGNOSIS — R5382 Chronic fatigue, unspecified: Secondary | ICD-10-CM

## 2019-06-10 DIAGNOSIS — C3431 Malignant neoplasm of lower lobe, right bronchus or lung: Secondary | ICD-10-CM

## 2019-06-11 ENCOUNTER — Encounter: Payer: Self-pay | Admitting: Internal Medicine

## 2019-06-11 ENCOUNTER — Inpatient Hospital Stay (HOSPITAL_BASED_OUTPATIENT_CLINIC_OR_DEPARTMENT_OTHER): Payer: Medicare Other | Admitting: Internal Medicine

## 2019-06-11 ENCOUNTER — Inpatient Hospital Stay: Payer: Medicare Other

## 2019-06-11 ENCOUNTER — Inpatient Hospital Stay: Payer: Medicare Other | Attending: Internal Medicine

## 2019-06-11 ENCOUNTER — Other Ambulatory Visit: Payer: Self-pay

## 2019-06-11 VITALS — BP 163/72 | HR 81 | Temp 97.8°F | Resp 20 | Ht 71.0 in | Wt 143.1 lb

## 2019-06-11 DIAGNOSIS — R5383 Other fatigue: Secondary | ICD-10-CM | POA: Diagnosis not present

## 2019-06-11 DIAGNOSIS — Z5112 Encounter for antineoplastic immunotherapy: Secondary | ICD-10-CM

## 2019-06-11 DIAGNOSIS — C3431 Malignant neoplasm of lower lobe, right bronchus or lung: Secondary | ICD-10-CM

## 2019-06-11 DIAGNOSIS — R531 Weakness: Secondary | ICD-10-CM

## 2019-06-11 DIAGNOSIS — G4700 Insomnia, unspecified: Secondary | ICD-10-CM | POA: Diagnosis not present

## 2019-06-11 DIAGNOSIS — Z79899 Other long term (current) drug therapy: Secondary | ICD-10-CM | POA: Diagnosis not present

## 2019-06-11 DIAGNOSIS — E871 Hypo-osmolality and hyponatremia: Secondary | ICD-10-CM | POA: Diagnosis not present

## 2019-06-11 DIAGNOSIS — R5382 Chronic fatigue, unspecified: Secondary | ICD-10-CM

## 2019-06-11 DIAGNOSIS — G620 Drug-induced polyneuropathy: Secondary | ICD-10-CM | POA: Diagnosis not present

## 2019-06-11 DIAGNOSIS — I10 Essential (primary) hypertension: Secondary | ICD-10-CM

## 2019-06-11 LAB — CMP (CANCER CENTER ONLY)
ALT: 14 U/L (ref 0–44)
AST: 18 U/L (ref 15–41)
Albumin: 3.8 g/dL (ref 3.5–5.0)
Alkaline Phosphatase: 100 U/L (ref 38–126)
Anion gap: 10 (ref 5–15)
BUN: 11 mg/dL (ref 8–23)
CO2: 23 mmol/L (ref 22–32)
Calcium: 9 mg/dL (ref 8.9–10.3)
Chloride: 94 mmol/L — ABNORMAL LOW (ref 98–111)
Creatinine: 0.79 mg/dL (ref 0.61–1.24)
GFR, Est AFR Am: >60 mL/min (ref >60)
GFR, Estimated: >60 mL/min (ref >60)
Glucose, Bld: 90 mg/dL (ref 70–99)
Potassium: 4.3 mmol/L (ref 3.5–5.1)
Sodium: 127 mmol/L — ABNORMAL LOW (ref 135–145)
Total Bilirubin: 0.9 mg/dL (ref 0.3–1.2)
Total Protein: 6.3 g/dL — ABNORMAL LOW (ref 6.5–8.1)

## 2019-06-11 LAB — CBC WITH DIFFERENTIAL (CANCER CENTER ONLY)
Abs Immature Granulocytes: 0.01 10*3/uL (ref 0.00–0.07)
Basophils Absolute: 0 10*3/uL (ref 0.0–0.1)
Basophils Relative: 1 %
Eosinophils Absolute: 0.3 10*3/uL (ref 0.0–0.5)
Eosinophils Relative: 4 %
HCT: 39.8 % (ref 39.0–52.0)
Hemoglobin: 14.2 g/dL (ref 13.0–17.0)
Immature Granulocytes: 0 %
Lymphocytes Relative: 20 %
Lymphs Abs: 1.2 10*3/uL (ref 0.7–4.0)
MCH: 32.1 pg (ref 26.0–34.0)
MCHC: 35.7 g/dL (ref 30.0–36.0)
MCV: 89.8 fL (ref 80.0–100.0)
Monocytes Absolute: 0.6 10*3/uL (ref 0.1–1.0)
Monocytes Relative: 9 %
Neutro Abs: 4.1 10*3/uL (ref 1.7–7.7)
Neutrophils Relative %: 66 %
Platelet Count: 254 10*3/uL (ref 150–400)
RBC: 4.43 MIL/uL (ref 4.22–5.81)
RDW: 13 % (ref 11.5–15.5)
WBC Count: 6.2 10*3/uL (ref 4.0–10.5)
nRBC: 0 % (ref 0.0–0.2)

## 2019-06-11 LAB — TSH: TSH: 2.526 u[IU]/mL (ref 0.320–4.118)

## 2019-06-11 MED ORDER — SODIUM CHLORIDE 0.9 % IV SOLN
480.0000 mg | Freq: Once | INTRAVENOUS | Status: AC
  Administered 2019-06-11: 480 mg via INTRAVENOUS
  Filled 2019-06-11: qty 48

## 2019-06-11 MED ORDER — SODIUM CHLORIDE 0.9 % IV SOLN
Freq: Once | INTRAVENOUS | Status: AC
  Administered 2019-06-11: 12:00:00 via INTRAVENOUS
  Filled 2019-06-11: qty 250

## 2019-06-11 NOTE — Patient Instructions (Signed)
[facility] Discharge Instructions for Patients Receiving Chemotherapy  Today you received the following chemotherapy agents: Nivolumab (Opdivo)  To help prevent nausea and vomiting after your treatment, we encourage you to take your nausea medication as directed.   If you develop nausea and vomiting that is not controlled by your nausea medication, call the clinic.   BELOW ARE SYMPTOMS THAT SHOULD BE REPORTED IMMEDIATELY:  *FEVER GREATER THAN 100.5 F  *CHILLS WITH OR WITHOUT FEVER  NAUSEA AND VOMITING THAT IS NOT CONTROLLED WITH YOUR NAUSEA MEDICATION  *UNUSUAL SHORTNESS OF BREATH  *UNUSUAL BRUISING OR BLEEDING  TENDERNESS IN MOUTH AND THROAT WITH OR WITHOUT PRESENCE OF ULCERS  *URINARY PROBLEMS  *BOWEL PROBLEMS  UNUSUAL RASH Items with * indicate a potential emergency and should be followed up as soon as possible.  Feel free to call the clinic should you have any questions or concerns. The clinic phone number is [phone].  Please show the CHEMO ALERT CARD at check-in to the Emergency Department and triage nurse.  Coronavirus (COVID-19) Are you at risk?  Are you at risk for the Coronavirus (COVID-19)?  To be considered HIGH RISK for Coronavirus (COVID-19), you have to meet the following criteria:  . Traveled to [place], [place], [place], [place] or [place]; or in the [place] to [place], [place], [place], or [place]; and have fever, cough, and shortness of breath within the last 2 weeks of travel OR . Been in close contact with a person diagnosed with COVID-19 within the last 2 weeks and have fever, cough, and shortness of breath . IF YOU DO NOT MEET THESE CRITERIA, YOU ARE CONSIDERED LOW RISK FOR COVID-19.  What to do if you are HIGH RISK for COVID-19?  . If you are having a medical emergency, call 911. . Seek medical care right away. Before you go to a doctor's office, urgent care or emergency department, call ahead and tell them about  your recent travel, contact with someone diagnosed with COVID-19, and your symptoms. You should receive instructions from your physician's office regarding next steps of care.  . When you arrive at healthcare provider, tell the healthcare staff immediately you have returned from visiting [place], [place], [place], [place] or [place]; or traveled in the [place] to [place], [place], [place], or [place]; in the last two weeks or you have been in close contact with a person diagnosed with COVID-19 in the last 2 weeks.   . Tell the health care staff about your symptoms: fever, cough and shortness of breath. . After you have been seen by a medical provider, you will be either: o Tested for (COVID-19) and discharged home on quarantine except to seek medical care if symptoms worsen, and asked to  - Stay home and avoid contact with others until you get your results (4-5 days)  - Avoid travel on public transportation if possible (such as bus, train, or airplane) or o Sent to the Emergency Department by EMS for evaluation, COVID-19 testing, and possible admission depending on your condition and test results.  What to do if you are LOW RISK for COVID-19?  Reduce your risk of any infection by using the same precautions used for avoiding the common cold or flu:  . Wash your hands often with soap and warm water for at least 20 seconds.  If soap and water are not readily available, use an alcohol-based hand sanitizer with at least 60% alcohol.  . If coughing or   sneezing, cover your mouth and nose by coughing or sneezing into the elbow areas of your shirt or coat, into a tissue or into your sleeve (not your hands). . Avoid shaking hands with others and consider head nods or verbal greetings only. . Avoid touching your eyes, nose, or mouth with unwashed hands.  . Avoid close contact with people who are sick. . Avoid places or events with large numbers of people in one location, like concerts or sporting  events. . Carefully consider travel plans you have or are making. . If you are planning any travel outside or inside the [place], visit the CDC's Travelers' Health webpage for the latest health notices. . If you have some symptoms but not all symptoms, continue to monitor at home and seek medical attention if your symptoms worsen. . If you are having a medical emergency, call 911.   ADDITIONAL HEALTHCARE OPTIONS FOR PATIENTS  [facility] / e-Visit: [email or web address]         [facility]: [phone]  [facility]: [phone]                   [facility]: [phone]   

## 2019-06-11 NOTE — Progress Notes (Signed)
[place] [phone]   [phone]  OFFICE PROGRESS NOTE  [name], MD [place] [address]  DIAGNOSIS: Stage IV (T1a, N0, M1b) non-small cell lung cancer, adenocarcinoma of the right lower lobe diagnosed in October 2014  Molecular profile: Negative forRET, ALK, BRAF, MET, EGFR.  Positive for ERBB2 A775T, [id number], KRAS G13E, MAP2K1 D67N (see full report).   PRIOR THERAPY:  1) Systemic chemotherapy with carboplatin for AUC of 5 and Alimta 500 mg/M2 every 3 weeks, status post 6 cycles with stable disease. First dose on 10/23/2013. 2) Maintenance systemic chemotherapy with single agent Alimta 500 mg/M2 every 3 weeks. Status post 8 cycles.  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 Sondra Come completed on 12/16/2014. 4) stereotactic radiotherapy to the enlarging right upper lobe lung nodule under the care of Dr. Sondra Come completed on 11/22/2016. 5) Immunotherapy with Nivolumab 240 mg IV every 2 weeks, status post 60 cycles. First cycle was given on 01/28/2015. Last dose 07/05/2017.  CURRENT THERAPY: Immunotherapy with Nivolumab 480 mg IV every 4 weeks. First dose 07/19/2017. Status post 24 cycles.  INTERVAL HISTORY: Anthony Curry. 78 y.o. male returns to the clinic today for follow-up visit.  He was accompanied by his wife.  The patient continues to complain of increasing fatigue and weakness as well as the persistent peripheral neuropathy.  He had prescription for amitriptyline from Dr. Mickeal Skinner but he was not taking it.  He also has insomnia and continues to use Xanax at nighttime as needed.  The patient denied having any current chest pain, shortness of breath, cough or hemoptysis.  He denied having any fever or chills.  He has no nausea, vomiting, diarrhea or constipation.  He denied having any recent weight loss or night sweats.  He is here today for evaluation before  starting cycle #25 of his treatment.  MEDICAL HISTORY: Past Medical History:  Diagnosis Date   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[place])    Adrenal mass ([place])    Alcoholism in remission ([place])    Anemia of chronic disease    Antineoplastic chemotherapy induced anemia(285.3)    Anxiety    BPH (benign prostatic hyperplasia)    Cancer ([facility])    small cell/lung   Complication of anesthesia 09/08/2015    JITTERY AFTER GALLBLADDER SURGERY   GERD (gastroesophageal reflux disease)    History of radiation therapy 11/14/16, 11/18/16, 11/22/16   Left upper lung: 54 Gy in 3 fractions   Hypertension    Hypertension 12/07/2016   Neoplastic malignant related fatigue    IV tx. every 2 weeks last9-14-16 (Dr. Earlie Server), radiation last tx. 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  MEDICATIONS:  Current Outpatient Medications  Medication Sig Dispense Refill   ALPRAZolam (XANAX) 0.25 MG tablet TAKE 1 TABLET(0.25 MG) BY MOUTH AT BEDTIME AS NEEDED FOR ANXIETY 30 tablet 0   apixaban (ELIQUIS) 5 MG TABS tablet Take 5 mg by mouth 2 (two) times daily.     Ascorbic Acid (VITAMIN C PO) Take 1,000 mg by mouth daily.      B Complex-C (SUPER B COMPLEX PO) Take 1 each by mouth daily.      calcium carbonate (OS-CAL) 600 MG TABS Take  600 mg by mouth daily with breakfast.     demeclocycline (DECLOMYCIN) 150 MG tablet Take 150 mg by mouth 2 (two) times daily.     diltiazem (CARDIZEM CD) 180 MG 24 hr capsule Take 1 capsule (180 mg total) by mouth daily at 12 noon. 90 capsule 3   furosemide (LASIX) 40 MG tablet Take 0.5 tablets (20 mg total) by mouth every other day.     HYDROcodone-acetaminophen (NORCO/VICODIN) 5-325 MG tablet Take 1 tablet by mouth every 6 (six) hours as needed for moderate pain. 40 tablet 0   KLOR-CON M20 20  MEQ tablet Take 20 mEq by mouth every morning.      Lactobacillus (ACIDOPHILUS) CAPS capsule Take 1 capsule by mouth daily.     levothyroxine (SYNTHROID, LEVOTHROID) 50 MCG tablet TAKE 1 TABLET (50 MCG TOTAL) BY MOUTH DAILY BEFORE BREAKFAST. 90 tablet 0   MAGNESIUM PO Take 400 mg by mouth daily.      Multiple Vitamin (MULTIVITAMIN WITH MINERALS) TABS Take 1 tablet by mouth daily.     Nivolumab (OPDIVO IV) Inject as directed every 2 weeks     nivolumab 480 mg in sodium chloride 0.9 % 100 mL Patient states receives this only once a month, not Q 2 weeks.     Nutritional Supplements (ENSURE COMPLETE PO) Take 1 Can by mouth 2 (two) times daily.     Omega-3 Fatty Acids (FISH OIL PO) Take 1 tablet by mouth daily.     pantoprazole (PROTONIX) 40 MG tablet Take 40 mg by mouth every morning.  8   promethazine-dextromethorphan (PROMETHAZINE-DM) 6.25-15 MG/5ML syrup Take 5 mLs by mouth 3 (three) times daily as needed for cough. 118 mL 0   tamsulosin (FLOMAX) 0.4 MG CAPS capsule Take 0.4 mg by mouth daily at 12 noon.      thiamine (VITAMIN B-1) 100 MG tablet Take 1 tablet (100 mg total) by mouth daily. 30 tablet 6   vitamin E 400 UNIT capsule Take 400 Units by mouth daily.     No current facility-administered medications for this visit.     SURGICAL HISTORY:  Past Surgical History:  Procedure Laterality Date   ANKLE ARTHRODESIS  1995   rt fx-hardware in   CARDIOVERSION N/A 09/29/2014   Procedure: CARDIOVERSION;  Surgeon: Josue Hector, MD;  [facility] ENDOSCOPY;  Service: Cardiovascular;  Laterality: N/A;   CHOLECYSTECTOMY  09/08/2015    laproscopic    CHOLECYSTECTOMY N/A 09/08/2015   Procedure: LAPAROSCOPIC CHOLECYSTECTOMY WITH ATTEMPTED INTRAOPERATIVE CHOLANGIOGRAM ;  Surgeon: Fanny Skates, MD;  Location: [place];  Service: General;  Laterality: N/A;   COLONOSCOPY     ERCP N/A 09/02/2015   Procedure: ENDOSCOPIC RETROGRADE CHOLANGIOPANCREATOGRAPHY (ERCP);  Surgeon: Arta Silence, MD;  Location: [facility] ENDOSCOPY;  Service: Endoscopy;  Laterality: N/A;   ERCP N/A 09/07/2015   Procedure: ENDOSCOPIC RETROGRADE CHOLANGIOPANCREATOGRAPHY (ERCP);  Surgeon: Clarene Essex, MD;  Location: [facility] ENDOSCOPY;  Service: Endoscopy;  Laterality: N/A;   EXCISION / CURETTAGE BONE CYST FINGER  2005   rt thumb   HEMORRHOID SURGERY     Percutaneous Cholecytostomy tube     IVR insertion 06-13-15(Dr. Vernard Gambles)- 08-28-15 remains in place to drainage bag   TONSILLECTOMY     TOTAL SHOULDER ARTHROPLASTY  02/03/2012   Procedure: TOTAL SHOULDER ARTHROPLASTY;  Surgeon: [name], MD;  Location: [facility];  Service: Orthopedics;  Laterality: Left;    REVIEW OF SYSTEMS:  A comprehensive review of systems was negative except for: Constitutional: positive for  fatigue Neurological: positive for paresthesia Behavioral/Psych: positive for sleep disturbance   PHYSICAL EXAMINATION: General appearance: alert, cooperative, fatigued and no distress Head: Normocephalic, without obvious abnormality, atraumatic Neck: no adenopathy, no JVD, supple, symmetrical, trachea midline and thyroid not enlarged, symmetric, no tenderness/mass/nodules Lymph nodes: Cervical, supraclavicular, and axillary nodes normal. Resp: clear to auscultation bilaterally Back: symmetric, no curvature. ROM normal. No CVA tenderness. Cardio: regular rate and rhythm, S1, S2 normal, no murmur, click, rub or gallop GI: soft, non-tender; bowel sounds normal; no masses,  no organomegaly Extremities: extremities normal, atraumatic, no cyanosis or edema  ECOG PERFORMANCE STATUS: 1 - Symptomatic but completely ambulatory  Blood pressure (!) 163/72, pulse 81, temperature 97.8 F (36.6 C), temperature source Oral, resp. rate 20, height '5\' 11"'$  (1.803 m), weight 143 lb 1.6 oz (64.9 kg), SpO2 100 %.  LABORATORY DATA: Lab Results  Component Value Date   WBC 6.2 06/11/2019   HGB 14.2 06/11/2019   HCT 39.8 06/11/2019   MCV  89.8 06/11/2019   PLT 254 06/11/2019      Chemistry      Component Value Date/Time   NA 127 (L) 06/11/2019 0935   NA 132 (L) 12/06/2017 0930   K 4.3 06/11/2019 0935   K 4.0 12/06/2017 0930   CL 94 (L) 06/11/2019 0935   CO2 23 06/11/2019 0935   CO2 26 12/06/2017 0930   BUN 11 06/11/2019 0935   BUN 15.4 12/06/2017 0930   CREATININE 0.79 06/11/2019 0935   CREATININE 0.9 12/06/2017 0930      Component Value Date/Time   CALCIUM 9.0 06/11/2019 0935   CALCIUM 9.9 12/06/2017 0930   ALKPHOS 100 06/11/2019 0935   ALKPHOS 107 12/06/2017 0930   AST 18 06/11/2019 0935   AST 20 12/06/2017 0930   ALT 14 06/11/2019 0935   ALT 14 12/06/2017 0930   BILITOT 0.9 06/11/2019 0935   BILITOT 0.76 12/06/2017 0930       RADIOGRAPHIC STUDIES: Ct Chest W Contrast  Result Date: 05/13/2019 CLINICAL DATA:  Right lung cancer. EXAM: CT CHEST, ABDOMEN, AND PELVIS WITH CONTRAST TECHNIQUE: Multidetector CT imaging of the chest, abdomen and pelvis was performed following the standard protocol during bolus administration of intravenous contrast. CONTRAST:  178m OMNIPAQUE IOHEXOL 300 MG/ML  SOLN COMPARISON:  02/18/2019 in 05/09/2017. FINDINGS: CT CHEST FINDINGS Cardiovascular: Atherosclerotic calcification of the aorta and coronary arteries. Heart size normal. Small amount pericardial fluid is similar. Mediastinum/Nodes: No pathologically enlarged mediastinal hilar or axillary lymph nodes. Esophagus is grossly unremarkable. Lungs/Pleura: Centrilobular and paraseptal emphysema. Scarring in both upper lobes, unchanged. Part solid mass in the posterior aspect of the right upper lobe measures 1.8 x 3.2 cm (series 6, image 56) with an internal solid component measuring 9 mm (54). When compared with 05/09/2017, lesion has increased slightly in size from 1.9 x 2.4 cm and the solid component appears new. Slight peripheral thickening of a focal bed of emphysema in the right lower lobe (series 6, image 91), measuring 1.8 cm.  When compared with 05/09/2017, thickening is new. 11 mm sub solid nodule in the left upper lobe (51) has enlarged from 6 mm on 05/09/2017. Multiple additional areas of ill-defined ground-glass bilaterally, as on prior exams. 4 mm nodule in the anterior right lower lobe is indicative of a benign subpleural lymph node, chronically stable. No pleural fluid. Airway is unremarkable. Musculoskeletal: Extensive degenerative changes in the spine. Old rib fractures. Severe degenerative change and healed fracture involving the right shoulder joint and proximal humerus, respectively.  Left shoulder arthroplasty. CT ABDOMEN PELVIS FINDINGS Hepatobiliary: Low-attenuation lesions in the liver measure up to 1.7 cm in the inferior right hepatic lobe, as before. Cholecystectomy. Air is seen in the common bile duct indicative of sphincter of Oddi dysfunction. No biliary ductal dilatation. Pancreas: Negative. Spleen: Negative. Adrenals/Urinary Tract: Slight thickening of the right adrenal gland. Left adrenal gland is unremarkable. Scarring in both kidneys. Subcentimeter low-attenuation lesion in the lower pole left kidney is too small to definitively characterize but statistically, a cyst is likely. Ureters are decompressed. Bladder wall trabeculation. Stomach/Bowel: Stomach, small bowel, appendix and colon are unremarkable. Vascular/Lymphatic: Atherosclerotic calcification of the aorta without aneurysm. No pathologically enlarged lymph nodes. Reproductive: Prostate is visualized. Other: No free fluid.  Mesenteries and peritoneum are unremarkable. Musculoskeletal: Probable bone islands in the pelvis. Lytic lesion in right iliac wing measures 11 mm, as on 05/09/2017. Marked degenerative changes in the spine. IMPRESSION: 1. Sub solid pulmonary nodules have progressed from 05/09/2017, as discussed above. Low-grade adenocarcinoma cannot be excluded. Continued attention on follow-up exams is warranted. 2. Aortic atherosclerosis  (ICD10-170.0). Coronary artery calcification. 3.  Emphysema (ICD10-J43.9). Electronically Signed   By: Lorin Picket M.D.   On: 05/13/2019 10:54   Ct Abdomen Pelvis W Contrast  Result Date: 05/13/2019 CLINICAL DATA:  Right lung cancer. EXAM: CT CHEST, ABDOMEN, AND PELVIS WITH CONTRAST TECHNIQUE: Multidetector CT imaging of the chest, abdomen and pelvis was performed following the standard protocol during bolus administration of intravenous contrast. CONTRAST:  128m OMNIPAQUE IOHEXOL 300 MG/ML  SOLN COMPARISON:  02/18/2019 in 05/09/2017. FINDINGS: CT CHEST FINDINGS Cardiovascular: Atherosclerotic calcification of the aorta and coronary arteries. Heart size normal. Small amount pericardial fluid is similar. Mediastinum/Nodes: No pathologically enlarged mediastinal hilar or axillary lymph nodes. Esophagus is grossly unremarkable. Lungs/Pleura: Centrilobular and paraseptal emphysema. Scarring in both upper lobes, unchanged. Part solid mass in the posterior aspect of the right upper lobe measures 1.8 x 3.2 cm (series 6, image 56) with an internal solid component measuring 9 mm (54). When compared with 05/09/2017, lesion has increased slightly in size from 1.9 x 2.4 cm and the solid component appears new. Slight peripheral thickening of a focal bed of emphysema in the right lower lobe (series 6, image 91), measuring 1.8 cm. When compared with 05/09/2017, thickening is new. 11 mm sub solid nodule in the left upper lobe (51) has enlarged from 6 mm on 05/09/2017. Multiple additional areas of ill-defined ground-glass bilaterally, as on prior exams. 4 mm nodule in the anterior right lower lobe is indicative of a benign subpleural lymph node, chronically stable. No pleural fluid. Airway is unremarkable. Musculoskeletal: Extensive degenerative changes in the spine. Old rib fractures. Severe degenerative change and healed fracture involving the right shoulder joint and proximal humerus, respectively. Left shoulder  arthroplasty. CT ABDOMEN PELVIS FINDINGS Hepatobiliary: Low-attenuation lesions in the liver measure up to 1.7 cm in the inferior right hepatic lobe, as before. Cholecystectomy. Air is seen in the common bile duct indicative of sphincter of Oddi dysfunction. No biliary ductal dilatation. Pancreas: Negative. Spleen: Negative. Adrenals/Urinary Tract: Slight thickening of the right adrenal gland. Left adrenal gland is unremarkable. Scarring in both kidneys. Subcentimeter low-attenuation lesion in the lower pole left kidney is too small to definitively characterize but statistically, a cyst is likely. Ureters are decompressed. Bladder wall trabeculation. Stomach/Bowel: Stomach, small bowel, appendix and colon are unremarkable. Vascular/Lymphatic: Atherosclerotic calcification of the aorta without aneurysm. No pathologically enlarged lymph nodes. Reproductive: Prostate is visualized. Other: No free fluid.  Mesenteries and  peritoneum are unremarkable. Musculoskeletal: Probable bone islands in the pelvis. Lytic lesion in right iliac wing measures 11 mm, as on 05/09/2017. Marked degenerative changes in the spine. IMPRESSION: 1. Sub solid pulmonary nodules have progressed from 05/09/2017, as discussed above. Low-grade adenocarcinoma cannot be excluded. Continued attention on follow-up exams is warranted. 2. Aortic atherosclerosis (ICD10-170.0). Coronary artery calcification. 3.  Emphysema (ICD10-J43.9). Electronically Signed   By: Lorin Picket M.D.   On: 05/13/2019 10:54    ASSESSMENT AND PLAN:  This is a very pleasant 78 years old white male with metastatic non-small cell lung cancer, adenocarcinoma and currently on treatment with second line immunotherapy with Nivolumab status post 60 cycles and has been tolerating his treatment well. He was recently switched to Nivolumab 480 MG IV every 4 weeks status post 24 cycles. The patient has been tolerating his treatment well with no concerning adverse effects. I  recommended for him to proceed with cycle #25 today as planned. For the peripheral neuropathy he will resume his treatment with amitriptyline. For the hyponatremia he will continue his current treatment with demeclocycline but I advised him to take 2 tablets in the morning and 1 tablet in the evening for at least 1 week. He will continue his current treatment with the pain medication as well as Xanax as needed for sleep. The patient will come back for follow-up visit in 4 weeks for evaluation before the next cycle of his treatment. He was advised to call immediately if he has any concerning symptoms in the interval. The patient voices understanding of current disease status and treatment options and is in agreement with the current care plan. All questions were answered. The patient knows to call the clinic with any problems, questions or concerns. We can certainly see the patient much sooner if necessary.  Disclaimer: This note was dictated with voice recognition software. Similar sounding words can inadvertently be transcribed and may not be corrected upon review.

## 2019-06-13 ENCOUNTER — Other Ambulatory Visit: Payer: Self-pay | Admitting: Internal Medicine

## 2019-06-13 ENCOUNTER — Telehealth: Payer: Self-pay | Admitting: Medical Oncology

## 2019-06-13 DIAGNOSIS — G893 Neoplasm related pain (acute) (chronic): Secondary | ICD-10-CM

## 2019-06-13 MED ORDER — HYDROCODONE-ACETAMINOPHEN 5-325 MG PO TABS: 1.0000 | ORAL_TABLET | Freq: Four times a day (QID) | ORAL | 0 refills | Status: DC | PRN

## 2019-06-13 NOTE — Telephone Encounter (Signed)
hydrocodone refill requested.  Xanax-I told pt it is not time for xanax refill and that [facility] said to resume his amitriptyline per Dr Mickeal Skinner for PN and slleeplessness. Pt voiced understanding.

## 2019-06-18 DIAGNOSIS — R300 Dysuria: Secondary | ICD-10-CM | POA: Diagnosis not present

## 2019-06-21 ENCOUNTER — Encounter: Payer: Self-pay | Admitting: Nutrition

## 2019-06-21 NOTE — Progress Notes (Signed)
Provided one case of Ensure Enlive for patient's wife to pick up today.

## 2019-06-27 ENCOUNTER — Telehealth: Payer: Self-pay | Admitting: Medical Oncology

## 2019-06-27 DIAGNOSIS — R42 Dizziness and giddiness: Secondary | ICD-10-CM | POA: Diagnosis not present

## 2019-06-27 NOTE — Telephone Encounter (Signed)
"  dizzy and lightheaded". He is seeing his PCP at 1230 today.

## 2019-06-28 ENCOUNTER — Emergency Department (HOSPITAL_COMMUNITY): Payer: Medicare Other

## 2019-06-28 ENCOUNTER — Observation Stay (HOSPITAL_COMMUNITY)
Admission: EM | Admit: 2019-06-28 | Discharge: 2019-06-29 | Disposition: A | Discharge status: Home / Self Care | Payer: Medicare Other | Attending: Family Medicine | Admitting: Family Medicine

## 2019-06-28 ENCOUNTER — Telehealth: Payer: Self-pay | Admitting: *Deleted

## 2019-06-28 ENCOUNTER — Other Ambulatory Visit: Payer: Self-pay

## 2019-06-28 DIAGNOSIS — Z79899 Other long term (current) drug therapy: Secondary | ICD-10-CM | POA: Diagnosis not present

## 2019-06-28 DIAGNOSIS — N400 Enlarged prostate without lower urinary tract symptoms: Secondary | ICD-10-CM | POA: Diagnosis not present

## 2019-06-28 DIAGNOSIS — M6281 Muscle weakness (generalized): Secondary | ICD-10-CM | POA: Insufficient documentation

## 2019-06-28 DIAGNOSIS — Z9049 Acquired absence of other specified parts of digestive tract: Secondary | ICD-10-CM | POA: Diagnosis not present

## 2019-06-28 DIAGNOSIS — F419 Anxiety disorder, unspecified: Secondary | ICD-10-CM | POA: Diagnosis not present

## 2019-06-28 DIAGNOSIS — E872 Acidosis, unspecified: Secondary | ICD-10-CM | POA: Diagnosis present

## 2019-06-28 DIAGNOSIS — N419 Inflammatory disease of prostate, unspecified: Secondary | ICD-10-CM | POA: Insufficient documentation

## 2019-06-28 DIAGNOSIS — Z7901 Long term (current) use of anticoagulants: Secondary | ICD-10-CM | POA: Diagnosis not present

## 2019-06-28 DIAGNOSIS — C7970 Secondary malignant neoplasm of unspecified adrenal gland: Secondary | ICD-10-CM | POA: Insufficient documentation

## 2019-06-28 DIAGNOSIS — D6481 Anemia due to antineoplastic chemotherapy: Secondary | ICD-10-CM | POA: Insufficient documentation

## 2019-06-28 DIAGNOSIS — Z923 Personal history of irradiation: Secondary | ICD-10-CM | POA: Insufficient documentation

## 2019-06-28 DIAGNOSIS — Z87891 Personal history of nicotine dependence: Secondary | ICD-10-CM | POA: Diagnosis not present

## 2019-06-28 DIAGNOSIS — E871 Hypo-osmolality and hyponatremia: Principal | ICD-10-CM | POA: Insufficient documentation

## 2019-06-28 DIAGNOSIS — E039 Hypothyroidism, unspecified: Secondary | ICD-10-CM | POA: Diagnosis not present

## 2019-06-28 DIAGNOSIS — C3490 Malignant neoplasm of unspecified part of unspecified bronchus or lung: Secondary | ICD-10-CM | POA: Diagnosis present

## 2019-06-28 DIAGNOSIS — C3431 Malignant neoplasm of lower lobe, right bronchus or lung: Secondary | ICD-10-CM | POA: Insufficient documentation

## 2019-06-28 DIAGNOSIS — R270 Ataxia, unspecified: Secondary | ICD-10-CM | POA: Diagnosis not present

## 2019-06-28 DIAGNOSIS — R269 Unspecified abnormalities of gait and mobility: Secondary | ICD-10-CM | POA: Diagnosis not present

## 2019-06-28 DIAGNOSIS — Z96612 Presence of left artificial shoulder joint: Secondary | ICD-10-CM | POA: Insufficient documentation

## 2019-06-28 DIAGNOSIS — F1021 Alcohol dependence, in remission: Secondary | ICD-10-CM | POA: Diagnosis not present

## 2019-06-28 DIAGNOSIS — Z1159 Encounter for screening for other viral diseases: Secondary | ICD-10-CM | POA: Diagnosis not present

## 2019-06-28 DIAGNOSIS — R262 Difficulty in walking, not elsewhere classified: Secondary | ICD-10-CM | POA: Diagnosis not present

## 2019-06-28 DIAGNOSIS — Z8249 Family history of ischemic heart disease and other diseases of the circulatory system: Secondary | ICD-10-CM | POA: Insufficient documentation

## 2019-06-28 DIAGNOSIS — K219 Gastro-esophageal reflux disease without esophagitis: Secondary | ICD-10-CM | POA: Insufficient documentation

## 2019-06-28 DIAGNOSIS — I480 Paroxysmal atrial fibrillation: Secondary | ICD-10-CM | POA: Diagnosis not present

## 2019-06-28 DIAGNOSIS — Z79890 Hormone replacement therapy (postmenopausal): Secondary | ICD-10-CM | POA: Insufficient documentation

## 2019-06-28 DIAGNOSIS — R000 Tachycardia, unspecified: Secondary | ICD-10-CM | POA: Diagnosis not present

## 2019-06-28 DIAGNOSIS — Z20828 Contact with and (suspected) exposure to other viral communicable diseases: Secondary | ICD-10-CM | POA: Diagnosis not present

## 2019-06-28 DIAGNOSIS — I10 Essential (primary) hypertension: Secondary | ICD-10-CM | POA: Insufficient documentation

## 2019-06-28 DIAGNOSIS — R531 Weakness: Secondary | ICD-10-CM

## 2019-06-28 DIAGNOSIS — E860 Dehydration: Secondary | ICD-10-CM | POA: Diagnosis not present

## 2019-06-28 DIAGNOSIS — I4891 Unspecified atrial fibrillation: Secondary | ICD-10-CM | POA: Diagnosis present

## 2019-06-28 DIAGNOSIS — R8290 Unspecified abnormal findings in urine: Secondary | ICD-10-CM

## 2019-06-28 DIAGNOSIS — R079 Chest pain, unspecified: Secondary | ICD-10-CM | POA: Diagnosis not present

## 2019-06-28 DIAGNOSIS — R072 Precordial pain: Secondary | ICD-10-CM | POA: Diagnosis not present

## 2019-06-28 LAB — BASIC METABOLIC PANEL
Anion gap: 7 (ref 5–15)
Anion gap: 6 (ref 5–15)
Anion gap: 8 (ref 5–15)
BUN: 7 mg/dL — ABNORMAL LOW (ref 8–23)
BUN: 9 mg/dL (ref 8–23)
BUN: 6 mg/dL — ABNORMAL LOW (ref 8–23)
CO2: 25 mmol/L (ref 22–32)
CO2: 25 mmol/L (ref 22–32)
CO2: 21 mmol/L — ABNORMAL LOW (ref 22–32)
Calcium: 8.9 mg/dL (ref 8.9–10.3)
Calcium: 8.5 mg/dL — ABNORMAL LOW (ref 8.9–10.3)
Calcium: 8.5 mg/dL — ABNORMAL LOW (ref 8.9–10.3)
Chloride: 96 mmol/L — ABNORMAL LOW (ref 98–111)
Chloride: 97 mmol/L — ABNORMAL LOW (ref 98–111)
Chloride: 96 mmol/L — ABNORMAL LOW (ref 98–111)
Creatinine, Ser: 0.65 mg/dL (ref 0.61–1.24)
Creatinine, Ser: 0.66 mg/dL (ref 0.61–1.24)
Creatinine, Ser: 0.58 mg/dL — ABNORMAL LOW (ref 0.61–1.24)
GFR calc Af Amer: >60 mL/min (ref >60)
GFR calc Af Amer: >60 mL/min (ref >60)
GFR calc Af Amer: >60 mL/min (ref >60)
GFR calc non Af Amer: >60 mL/min (ref >60)
GFR calc non Af Amer: >60 mL/min (ref >60)
GFR calc non Af Amer: >60 mL/min (ref >60)
Glucose, Bld: 98 mg/dL (ref 70–99)
Glucose, Bld: 100 mg/dL — ABNORMAL HIGH (ref 70–99)
Glucose, Bld: 90 mg/dL (ref 70–99)
Potassium: 3.7 mmol/L (ref 3.5–5.1)
Potassium: 3.9 mmol/L (ref 3.5–5.1)
Potassium: 3.9 mmol/L (ref 3.5–5.1)
Sodium: 128 mmol/L — ABNORMAL LOW (ref 135–145)
Sodium: 128 mmol/L — ABNORMAL LOW (ref 135–145)
Sodium: 125 mmol/L — ABNORMAL LOW (ref 135–145)

## 2019-06-28 LAB — APTT: aPTT: 32 seconds (ref 24–36)

## 2019-06-28 LAB — URINALYSIS, ROUTINE W REFLEX MICROSCOPIC
Bilirubin Urine: NEGATIVE
Glucose, UA: NEGATIVE mg/dL
Hgb urine dipstick: NEGATIVE
Ketones, ur: NEGATIVE mg/dL
Leukocytes,Ua: NEGATIVE
Nitrite: POSITIVE — AB
Protein, ur: NEGATIVE mg/dL
Specific Gravity, Urine: 1.004 — ABNORMAL LOW (ref 1.005–1.030)
pH: 9 — ABNORMAL HIGH (ref 5.0–8.0)

## 2019-06-28 LAB — PROTIME-INR
INR: 1.1 (ref 0.8–1.2)
Prothrombin Time: 14 seconds (ref 11.4–15.2)

## 2019-06-28 LAB — CBC WITH DIFFERENTIAL/PLATELET
Abs Immature Granulocytes: 0.02 10*3/uL (ref 0.00–0.07)
Basophils Absolute: 0 10*3/uL (ref 0.0–0.1)
Basophils Relative: 1 %
Eosinophils Absolute: 0.3 10*3/uL (ref 0.0–0.5)
Eosinophils Relative: 6 %
HCT: 39.3 % (ref 39.0–52.0)
Hemoglobin: 13.9 g/dL (ref 13.0–17.0)
Immature Granulocytes: 0 %
Lymphocytes Relative: 13 %
Lymphs Abs: 0.7 10*3/uL (ref 0.7–4.0)
MCH: 32 pg (ref 26.0–34.0)
MCHC: 35.4 g/dL (ref 30.0–36.0)
MCV: 90.3 fL (ref 80.0–100.0)
Monocytes Absolute: 0.7 10*3/uL (ref 0.1–1.0)
Monocytes Relative: 13 %
Neutro Abs: 3.5 10*3/uL (ref 1.7–7.7)
Neutrophils Relative %: 67 %
Platelets: 260 10*3/uL (ref 150–400)
RBC: 4.35 MIL/uL (ref 4.22–5.81)
RDW: 12.2 % (ref 11.5–15.5)
WBC: 5.2 10*3/uL (ref 4.0–10.5)
nRBC: 0 % (ref 0.0–0.2)

## 2019-06-28 LAB — COMPREHENSIVE METABOLIC PANEL
ALT: 15 U/L (ref 0–44)
AST: 21 U/L (ref 15–41)
Albumin: 3.6 g/dL (ref 3.5–5.0)
Alkaline Phosphatase: 77 U/L (ref 38–126)
Anion gap: 11 (ref 5–15)
BUN: 7 mg/dL — ABNORMAL LOW (ref 8–23)
CO2: 24 mmol/L (ref 22–32)
Calcium: 9 mg/dL (ref 8.9–10.3)
Chloride: 90 mmol/L — ABNORMAL LOW (ref 98–111)
Creatinine, Ser: 0.72 mg/dL (ref 0.61–1.24)
GFR calc Af Amer: >60 mL/min (ref >60)
GFR calc non Af Amer: >60 mL/min (ref >60)
Glucose, Bld: 104 mg/dL — ABNORMAL HIGH (ref 70–99)
Potassium: 3.7 mmol/L (ref 3.5–5.1)
Sodium: 125 mmol/L — ABNORMAL LOW (ref 135–145)
Total Bilirubin: 0.9 mg/dL (ref 0.3–1.2)
Total Protein: 5.9 g/dL — ABNORMAL LOW (ref 6.5–8.1)

## 2019-06-28 LAB — TROPONIN I (HIGH SENSITIVITY)
Troponin I (High Sensitivity): 14 ng/L (ref <18)
Troponin I (High Sensitivity): 16 ng/L (ref <18)

## 2019-06-28 LAB — LACTIC ACID, PLASMA
Lactic Acid, Venous: 0.8 mmol/L (ref 0.5–1.9)
Lactic Acid, Venous: 2.4 mmol/L (ref 0.5–1.9)
Lactic Acid, Venous: 1.2 mmol/L (ref 0.5–1.9)
Lactic Acid, Venous: 0.8 mmol/L (ref 0.5–1.9)

## 2019-06-28 LAB — OSMOLALITY, URINE: Osmolality, Ur: 231 mOsm/kg — ABNORMAL LOW (ref 300–900)

## 2019-06-28 LAB — BRAIN NATRIURETIC PEPTIDE: B Natriuretic Peptide: 67.4 pg/mL (ref 0.0–100.0)

## 2019-06-28 LAB — LIPASE, BLOOD: Lipase: 29 U/L (ref 11–51)

## 2019-06-28 LAB — SARS CORONAVIRUS 2 BY RT PCR (HOSPITAL ORDER, PERFORMED IN ~~LOC~~ HOSPITAL LAB): SARS Coronavirus 2: NEGATIVE

## 2019-06-28 LAB — OSMOLALITY: Osmolality: 264 mOsm/kg — ABNORMAL LOW (ref 275–295)

## 2019-06-28 LAB — SODIUM, URINE, RANDOM: Sodium, Ur: 63 mmol/L

## 2019-06-28 MED ORDER — BOOST PLUS PO LIQD
237.0000 mL | Freq: Three times a day (TID) | ORAL | Status: DC
  Administered 2019-06-28 – 2019-06-29 (×2): 237 mL via ORAL
  Filled 2019-06-28 (×7): qty 237

## 2019-06-28 MED ORDER — SODIUM CHLORIDE 0.9 % IV BOLUS
1000.0000 mL | Freq: Once | INTRAVENOUS | Status: AC
  Administered 2019-06-28: 1000 mL via INTRAVENOUS

## 2019-06-28 MED ORDER — SODIUM CHLORIDE 0.9 % IV SOLN
1000.0000 mL | INTRAVENOUS | Status: DC
  Administered 2019-06-28: 1000 mL via INTRAVENOUS

## 2019-06-28 MED ORDER — POTASSIUM CHLORIDE CRYS ER 20 MEQ PO TBCR
20.0000 meq | EXTENDED_RELEASE_TABLET | Freq: Every morning | ORAL | Status: DC
  Administered 2019-06-28 – 2019-06-29 (×2): 20 meq via ORAL
  Filled 2019-06-28 (×2): qty 1

## 2019-06-28 MED ORDER — SODIUM CHLORIDE 0.9% FLUSH
3.0000 mL | Freq: Two times a day (BID) | INTRAVENOUS | Status: DC
  Administered 2019-06-28: 3 mL via INTRAVENOUS

## 2019-06-28 MED ORDER — DILTIAZEM HCL ER COATED BEADS 180 MG PO CP24
180.0000 mg | ORAL_CAPSULE | Freq: Every day | ORAL | Status: DC
  Administered 2019-06-28 – 2019-06-29 (×2): 180 mg via ORAL
  Filled 2019-06-28 (×2): qty 1

## 2019-06-28 MED ORDER — ONDANSETRON HCL 4 MG/2ML IJ SOLN: 4.0000 mg | Freq: Four times a day (QID) | INTRAMUSCULAR | Status: DC | PRN

## 2019-06-28 MED ORDER — RISAQUAD PO CAPS
1.0000 | ORAL_CAPSULE | Freq: Every day | ORAL | Status: DC
  Administered 2019-06-28 – 2019-06-29 (×2): 1 via ORAL
  Filled 2019-06-28 (×2): qty 1

## 2019-06-28 MED ORDER — TAMSULOSIN HCL 0.4 MG PO CAPS
0.4000 mg | ORAL_CAPSULE | Freq: Every day | ORAL | Status: DC
  Administered 2019-06-28 – 2019-06-29 (×2): 0.4 mg via ORAL
  Filled 2019-06-28 (×2): qty 1

## 2019-06-28 MED ORDER — LEVOTHYROXINE SODIUM 50 MCG PO TABS
50.0000 ug | ORAL_TABLET | Freq: Every day | ORAL | Status: DC
  Administered 2019-06-29: 50 ug via ORAL
  Filled 2019-06-28: qty 1

## 2019-06-28 MED ORDER — APIXABAN 5 MG PO TABS
5.0000 mg | ORAL_TABLET | Freq: Two times a day (BID) | ORAL | Status: DC
  Administered 2019-06-28 – 2019-06-29 (×2): 5 mg via ORAL
  Filled 2019-06-28 (×2): qty 1

## 2019-06-28 MED ORDER — ACETAMINOPHEN 650 MG RE SUPP: 650.0000 mg | Freq: Four times a day (QID) | RECTAL | Status: DC | PRN

## 2019-06-28 MED ORDER — ALPRAZOLAM 0.25 MG PO TABS
0.2500 mg | ORAL_TABLET | Freq: Every day | ORAL | Status: DC
  Administered 2019-06-28: 0.25 mg via ORAL
  Filled 2019-06-28: qty 1

## 2019-06-28 MED ORDER — LIDOCAINE VISCOUS HCL 2 % MT SOLN: 15.0000 mL | Freq: Four times a day (QID) | OROMUCOSAL | Status: DC | PRN

## 2019-06-28 MED ORDER — LIDOCAINE VISCOUS HCL 2 % MT SOLN
15.0000 mL | Freq: Once | OROMUCOSAL | Status: AC
  Administered 2019-06-28: 15 mL via ORAL
  Filled 2019-06-28: qty 15

## 2019-06-28 MED ORDER — ONDANSETRON HCL 4 MG PO TABS: 4.0000 mg | ORAL_TABLET | Freq: Four times a day (QID) | ORAL | Status: DC | PRN

## 2019-06-28 MED ORDER — ALUM & MAG HYDROXIDE-SIMETH 200-200-20 MG/5ML PO SUSP: 30.0000 mL | Freq: Four times a day (QID) | ORAL | Status: DC | PRN

## 2019-06-28 MED ORDER — MAGNESIUM OXIDE 400 (241.3 MG) MG PO TABS
400.0000 mg | ORAL_TABLET | Freq: Every day | ORAL | Status: DC
  Administered 2019-06-28 – 2019-06-29 (×2): 400 mg via ORAL
  Filled 2019-06-28 (×2): qty 1

## 2019-06-28 MED ORDER — ALUM & MAG HYDROXIDE-SIMETH 200-200-20 MG/5ML PO SUSP
30.0000 mL | Freq: Once | ORAL | Status: AC
  Administered 2019-06-28: 30 mL via ORAL
  Filled 2019-06-28: qty 30

## 2019-06-28 MED ORDER — ACIDOPHILUS PO CAPS: 1.0000 | ORAL_CAPSULE | Freq: Every day | ORAL | Status: DC

## 2019-06-28 MED ORDER — APIXABAN 5 MG PO TABS: 5.0000 mg | ORAL_TABLET | ORAL | Status: DC

## 2019-06-28 MED ORDER — MAGNESIUM 200 MG PO TABS: 400.0000 mg | ORAL_TABLET | Freq: Every day | ORAL | Status: DC

## 2019-06-28 MED ORDER — DEMECLOCYCLINE HCL 150 MG PO TABS
300.0000 mg | ORAL_TABLET | Freq: Every day | ORAL | Status: DC
  Administered 2019-06-28 – 2019-06-29 (×2): 300 mg via ORAL
  Filled 2019-06-28 (×2): qty 2

## 2019-06-28 MED ORDER — HYDROCODONE-ACETAMINOPHEN 5-325 MG PO TABS
1.0000 | ORAL_TABLET | Freq: Four times a day (QID) | ORAL | Status: DC | PRN
  Administered 2019-06-28: 1 via ORAL
  Filled 2019-06-28 (×2): qty 1

## 2019-06-28 MED ORDER — PANTOPRAZOLE SODIUM 40 MG PO TBEC
40.0000 mg | DELAYED_RELEASE_TABLET | Freq: Every day | ORAL | Status: DC
  Administered 2019-06-28 – 2019-06-29 (×2): 40 mg via ORAL
  Filled 2019-06-28 (×2): qty 1

## 2019-06-28 MED ORDER — DEMECLOCYCLINE HCL 150 MG PO TABS
150.0000 mg | ORAL_TABLET | Freq: Every day | ORAL | Status: DC
  Administered 2019-06-28: 22:00:00 150 mg via ORAL
  Filled 2019-06-28 (×2): qty 1

## 2019-06-28 MED ORDER — ACETAMINOPHEN 325 MG PO TABS: 650.0000 mg | ORAL_TABLET | Freq: Four times a day (QID) | ORAL | Status: DC | PRN

## 2019-06-28 NOTE — ED Triage Notes (Signed)
Pt coming from home by EMS after experiencing new onset of dehydration/weakness. Was seen at his doctors yesterday and informed that NA was 125. Told by office MD to "Go home and drink Pedialyte and Gatorade." Some chest burning noted. 500 ml NS, pt refused ASA, no nitro given. Gave 6mg  Morphine that brought pain from 7/10 to 2/10. BP elevated @ 178/82, otherwise all vitals WDL

## 2019-06-28 NOTE — ED Provider Notes (Signed)
[facility] EMERGENCY DEPARTMENT Provider Note  CSN: [id number] Arrival date & time: 06/28/19 2706  Chief Complaint(s) Weakness  HPI Anthony Curry. is a 78 y.o. male with extensive past medical history listed below including metastatic non-small cell lung cancer, adrenal adenocarcinoma currently on chemotherapy and palliative radiation who was recently diagnosed with hyponatremia presents to the emergency department with 2 days of generalized fatigue and several hours of substernal burning consistent with "indigestion."  Chest pain is worsened with lying down and improved by sitting up.  Pain is nonradiating and nonexertional.  Patient denies any shortness of breath, nausea, vomiting, diarrhea.  No abdominal pain.  He endorses lightheadedness with sitting up.  Reports that he just finished a course of antibiotics for urinary tract infection but does not remember the antibiotic name.  HPI  Past Medical History Past Medical History:  Diagnosis Date  . 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 Adrenal mass ([place])   . Alcoholism in remission ([place])   . Anemia of chronic disease   . 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 Anxiety   . BPH (benign prostatic hyperplasia)   . Cancer ([place])    small cell/lung  . Complication of anesthesia 09/08/2015    JITTERY AFTER GALLBLADDER SURGERY  . GERD (gastroesophageal reflux disease)   . History of radiation therapy 11/14/16, 11/18/16, 11/22/16   Left upper lung: 54 Gy in 3 fractions  . Hypertension   . Hypertension 12/07/2016  . Neoplastic malignant related fatigue    IV tx. every 2 weeks last9-14-16 (Dr. Earlie Server), radiation last tx. 6 months  . 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 Persistent atrial fibrillation   . Pulmonary nodules   . Radiation 12/02/14-12/16/14   Left adrenal gland 30 Gy in 10 fractions   Patient Active Problem List   Diagnosis Date Noted  . Hypertension 12/07/2016  . 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 Acute urinary retention 09/09/2015  . Choledocholithiasis with cholecystitis 09/08/2015  . Common bile duct stone   . Encounter for antineoplastic immunotherapy 06/28/2015  . Atrial fibrillation ([place]) 06/11/2015  . Gynecomastia, male 06/10/2015  . Dizziness 05/11/2015  . Malignant neoplasm of lower lobe of right lung ([place]) 09/17/2014  . Neutropenic fever ([place]) 09/02/2014  . Neoplastic malignant related fatigue 08/13/2014  . Physical deconditioning 08/13/2014  . Hypoalbuminemia 08/13/2014  . Diarrhea 08/13/2014  . Antineoplastic chemotherapy induced anemia(285.3) 08/13/2014  . Atrial fibrillation with RVR ([place]) 07/28/2014  . HCAP (healthcare-associated pneumonia) 07/25/2014  . Alcoholism in remission ([place]) 07/25/2014  . Atrial flutter by electrocardiogram ([place]) 07/25/2014  . Swelling of limb 06/10/2014  . Lung cancer ([place]) 10/15/2013  . Hypotension 09/10/2013  . Adrenal mass ([place]) 09/09/2013  . 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 Pulmonary nodules 08/19/2013  . Anemia of chronic disease 07/03/2013  . BPH (benign prostatic hyperplasia) 07/03/2013  . GERD (gastroesophageal reflux disease) 07/03/2013  . Fall 07/03/2013  . Alcohol intoxication ([place]) 07/03/2013  . 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 Alcohol abuse 10/15/2011  . Hyponatremia 10/15/2011   Home Medication(s) Prior to Admission medications   Medication Sig Start Date End Date Taking? Authorizing Provider  ALPRAZolam (XANAX) 0.25 MG tablet TAKE 1 TABLET(0.25 MG) BY MOUTH AT BEDTIME AS NEEDED FOR ANXIETY 05/28/19   [name], MD  apixaban (ELIQUIS) 5 MG TABS tablet Take 5 mg by mouth 2 (two) times daily.    [provider]  Ascorbic Acid (VITAMIN C PO) Take 1,000 mg by mouth daily.  [provider]  B Complex-C (SUPER B COMPLEX PO) Take 1 each by mouth daily.     [provider]  calcium carbonate (OS-CAL) 600 MG TABS Take 600 mg by mouth  daily with breakfast.    [provider]  demeclocycline (DECLOMYCIN) 150 MG tablet Take 150 mg by mouth 2 (two) times daily.    [provider]  diltiazem (CARDIZEM CD) 180 MG 24 hr capsule Take 1 capsule (180 mg total) by mouth daily at 12 noon. 01/09/15   Sherran Needs, NP  furosemide (LASIX) 40 MG tablet Take 0.5 tablets (20 mg total) by mouth every other day. 07/21/16   Sherran Needs, NP  HYDROcodone-acetaminophen (NORCO/VICODIN) 5-325 MG tablet Take 1 tablet by mouth every 6 (six) hours as needed for moderate pain. 06/13/19   [name], MD  KLOR-CON M20 20 MEQ tablet Take 20 mEq by mouth every morning.  10/18/14   [provider]  Lactobacillus (ACIDOPHILUS) CAPS capsule Take 1 capsule by mouth daily.    [provider]  levothyroxine (SYNTHROID, LEVOTHROID) 50 MCG tablet TAKE 1 TABLET (50 MCG TOTAL) BY MOUTH DAILY BEFORE BREAKFAST. 03/24/18   [name], MD  MAGNESIUM PO Take 400 mg by mouth daily.     [provider]  Multiple Vitamin (MULTIVITAMIN WITH MINERALS) TABS Take 1 tablet by mouth daily.    [provider]  Nivolumab (OPDIVO IV) Inject as directed every 2 weeks    [provider]  nivolumab 480 mg in sodium chloride 0.9 % 100 mL Patient states receives this only once a month, not Q 2 weeks.    [provider]  Nutritional Supplements (ENSURE COMPLETE PO) Take 1 Can by mouth 2 (two) times daily.    [provider]  Omega-3 Fatty Acids (FISH OIL PO) Take 1 tablet by mouth daily.    [provider]  pantoprazole (PROTONIX) 40 MG tablet Take 40 mg by mouth every morning. 06/02/15   [provider]  promethazine-dextromethorphan (PROMETHAZINE-DM) 6.25-15 MG/5ML syrup Take 5 mLs by mouth 3 (three) times daily as needed for cough. 05/09/19   Wieters, Hallie C, PA-C  tamsulosin (FLOMAX) 0.4 MG CAPS capsule Take 0.4 mg by mouth daily at 12 noon.     [provider]  thiamine  (VITAMIN B-1) 100 MG tablet Take 1 tablet (100 mg total) by mouth daily. 09/11/13   Erick Colace, NP  vitamin E 400 UNIT capsule Take 400 Units by mouth daily.    [provider]                                                                                                                                    Past Surgical History Past Surgical History:  Procedure Laterality Date  . ANKLE ARTHRODESIS  1995   rt fx-hardware in  . CARDIOVERSION N/A 09/29/2014   Procedure: CARDIOVERSION;  Surgeon: Wallis Bamberg  Johnsie Cancel, MD;  Location: [name];  Service: Cardiovascular;  Laterality: N/A;  . CHOLECYSTECTOMY  09/08/2015    laproscopic   . CHOLECYSTECTOMY N/A 09/08/2015   Procedure: LAPAROSCOPIC CHOLECYSTECTOMY WITH ATTEMPTED INTRAOPERATIVE CHOLANGIOGRAM ;  Surgeon: Fanny Skates, MD;  Location: [facility];  Service: General;  Laterality: N/A;  . COLONOSCOPY    . ERCP N/A 09/02/2015   Procedure: ENDOSCOPIC RETROGRADE CHOLANGIOPANCREATOGRAPHY (ERCP);  Surgeon: Arta Silence, MD;  Location: [facility];  Service: Endoscopy;  Laterality: N/A;  . ERCP N/A 09/07/2015   Procedure: ENDOSCOPIC RETROGRADE CHOLANGIOPANCREATOGRAPHY (ERCP);  Surgeon: Clarene Essex, MD;  Location: [facility];  Service: Endoscopy;  Laterality: N/A;  . EXCISION / CURETTAGE BONE CYST FINGER  2005   rt thumb  . HEMORRHOID SURGERY    . Percutaneous Cholecytostomy tube     IVR insertion 06-13-15(Dr. Vernard Gambles)- 08-28-15 remains in place to drainage bag  . TONSILLECTOMY    . TOTAL SHOULDER ARTHROPLASTY  02/03/2012   Procedure: TOTAL SHOULDER ARTHROPLASTY;  Surgeon: [name], MD;  Location: [facility];  Service: Orthopedics;  Laterality: Left;   Family History Family History  Problem Relation Age of Onset  . Lung cancer Mother   . Hypertension Father   . Heart attack Father   . Diabetes Paternal Grandmother   . Brain cancer Brother   . Heart attack Brother   . Neuropathy Neg Hx     Social History  Social History   Tobacco Use  . Smoking status: Former Smoker    Packs/day: 2.00    Years: 30.00    Pack years: 60.00    Types: Cigarettes    Quit date: 12/05/1990    Years since quitting: 28.5  . Smokeless tobacco: Never Used  Substance Use Topics  . Alcohol use: Yes    Alcohol/week: 0.0 standard drinks    Comment: past hx.ETOH abuse,08-31-15 "occ. wine ,drinks non alcoholic beer occ."  . Drug use: No   Allergies Patient has no known allergies.  Review of Systems Review of Systems All other systems are reviewed and are negative for acute change except as noted in the HPI  Physical Exam Vital Signs  I have reviewed the triage vital signs BP (!) 179/80   Pulse 89   Temp 98.4 F (36.9 C)   Resp 15   SpO2 96%   Physical Exam Vitals signs reviewed.  Constitutional:      General: He is not in acute distress.    Appearance: He is well-developed. He is ill-appearing (chronically ill-appearing). He is not diaphoretic.  HENT:     Head: Normocephalic and atraumatic.     Nose: Nose normal.  Eyes:     General: No scleral icterus.       Right eye: No discharge.        Left eye: No discharge.     Conjunctiva/sclera: Conjunctivae normal.     Pupils: Pupils are equal, round, and reactive to light.  Neck:     Musculoskeletal: Normal range of motion and neck supple.  Cardiovascular:     Rate and Rhythm: Normal rate and regular rhythm.     Heart sounds: No murmur. No friction rub. No gallop.   Pulmonary:     Effort: Pulmonary effort is normal. No respiratory distress.     Breath sounds: Normal breath sounds. No stridor. No rales.  Abdominal:     General: There is no distension.     Palpations: Abdomen is soft.     Tenderness: There is  no abdominal tenderness.  Musculoskeletal:        General: No tenderness.     Right lower leg: No edema.     Left lower leg: No edema.  Skin:    General: Skin is warm and dry.     Findings: No erythema or rash.  Neurological:     Mental  Status: He is alert and oriented to person, place, and time.     ED Results and Treatments Labs (all labs ordered are listed, but only abnormal results are displayed) Labs Reviewed  URINALYSIS, ROUTINE W REFLEX MICROSCOPIC - Abnormal; Notable for the following components:      Result Value   Color, Urine AMBER (*)    Specific Gravity, Urine 1.004 (*)    pH 9.0 (*)    Nitrite POSITIVE (*)    Bacteria, UA RARE (*)    All other components within normal limits  URINE CULTURE  CBC WITH DIFFERENTIAL/PLATELET  APTT  PROTIME-INR  LACTIC ACID, PLASMA  LACTIC ACID, PLASMA  COMPREHENSIVE METABOLIC PANEL  BRAIN NATRIURETIC PEPTIDE  LIPASE, BLOOD  TROPONIN I (HIGH SENSITIVITY)                                                                                                                         EKG  EKG Interpretation  Date/Time:  Friday June 28 2019 06:28:40 EDT Ventricular Rate:  87 PR Interval:    QRS Duration: 78 QT Interval:  355 QTC Calculation: 427 R Axis:   59 Text Interpretation:  Sinus rhythm Prolonged PR interval Low voltage, precordial leads Consider anterior infarct No significant change since last tracing Confirmed by Addison Lank [phone]) on 06/28/2019 7:17:24 AM Also confirmed by Addison Lank [phone]), editor Philomena Doheny [phone])  on 06/28/2019 7:28:27 AM      Radiology Dg Chest Port 1 View  Result Date: 06/28/2019 CLINICAL DATA:  Chest pain EXAM: PORTABLE CHEST 1 VIEW COMPARISON:  05/09/2019 FINDINGS: Normal heart size and stable mediastinal contours. Hyperinflation and mild lung scarring. There is no edema, consolidation, effusion, or pneumothorax. Remote bilateral rib fractures. Remote right humeral neck fracture. Lucency over the lateral right chest wall is attributed to overlapping material. IMPRESSION: No acute finding when compared with priors. COPD. Electronically Signed   By: Monte Fantasia M.D.   On: 06/28/2019 07:15    Pertinent labs & imaging results  that were available during my care of the patient were reviewed by me and considered in my medical decision making (see chart for details).  Medications Ordered in ED Medications  0.9 %  sodium chloride infusion (has no administration in time range)  alum & mag hydroxide-simeth (MAALOX/MYLANTA) 200-200-20 MG/5ML suspension 30 mL (30 mLs Oral Given 06/28/19 0714)    And  lidocaine (XYLOCAINE) 2 % viscous mouth solution 15 mL (15 mLs Oral Given 06/28/19 0714)  sodium chloride 0.9 % bolus 1,000 mL (1,000 mLs Intravenous New Bag/Given 06/28/19 0715)  Procedures Procedures  (including critical care time)  Medical Decision Making / ED Course I have reviewed the nursing notes for this encounter and the patient's prior records (if available in EHR or on provided paperwork).   Anthony Curry. was evaluated in Emergency Department on 06/28/2019 for the symptoms described in the history of present illness. He was evaluated in the context of the global COVID-19 pandemic, which necessitated consideration that the patient might be at risk for infection with the SARS-CoV-2 virus that causes COVID-19. Institutional protocols and algorithms that pertain to the evaluation of patients at risk for COVID-19 are in a state of rapid change based on information released by regulatory bodies including the CDC and federal and state organizations. These policies and algorithms were followed during the patient's care in the ED.  1. Generalized fatigue. H/o cancer on chemoradiation. Known hyponatremia. Recent UTI. Will obtain orthostatics, get screening labs and provide with IVF.  2. Chest burning. Atypical.  EKG without acute ischemic changes or evidence of pericarditis.  Low suspicion for gastritis/GERD.  Will provide with GI cocktail Screening labs obtain. Chest x-ray without evidence  suggestive of pneumonia, pneumothorax, pneumomediastinum.  No abnormal contour of the mediastinum to suggest dissection. No evidence of acute injuries. Low suspicion for pulmonary embolism, dissection, esophageal perforation.  Patient care turned over to Dr Wilson Singer. Patient case and results discussed in detail; please see their note for further ED managment.          Final Clinical Impression(s) / ED Diagnoses Final diagnoses:  None      This chart was dictated using voice recognition software.  Despite best efforts to proofread,  errors can occur which can change the documentation meaning.   Fatima Blank, MD 06/28/19 [phone]

## 2019-06-28 NOTE — Telephone Encounter (Signed)
Received vm message from pt's wife, reporting that pt was admitted to [facility] via EMS to ED per request of his PCP. In basket message sent to Dr. [name] re: pt's admission.

## 2019-06-28 NOTE — ED Notes (Signed)
Pt drank 48oz of Pedialyte over 2-3 hours yesterday

## 2019-06-28 NOTE — H&P (Signed)
History and Physical    Anthony Curry. NID:[id number] DOB: 23-Sep-1941 DOA: 06/28/2019  Referring MD/NP/PA: Marney Doctor, MD PCP: [name], MD  Patient coming from: Home via EMS  Chief Complaint: Feeling rundown  I have personally briefly reviewed patient's old medical records in [place]   HPI: Anthony Curry. is a 78 y.o. 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 [name] of oncology, paroxysmal atrial fibrillation on Eliquis, hypertension, chronic hyponatremia, anxiety, and BPH; who presents with complaints of generalized weakness over the last several days.  However patient reports symptoms really bad in the last 2 days.  Notes associated symptoms of really bad heartburn, feeling unsteady on his feet, dizziness, and decreased p.o. intake.  He had went to his doctor last week and just completed a 10-day course of antibiotics yesterday for what he reports as a prostate infection.  Denies having any fever, recent falls, chest pain, and previous urinary symptoms have resolved.  He had tried drinking 3 bottles of Pedialyte as his primary doctor had said that he was dehydrated, but did not note any improvement in symptoms.  Due to persistence of symptoms and reports of inability to ambulate safely today his PCP called EMS to pick him up to bring him to the hospital.   ED Course: Upon admission into the emergency department patient was noted to be afebrile, blood pressurr 151/79-179/80, and all other vitals within normal limits.  Labs revealed sodium 125, chloride 90, lactic acid 0.8->2.4, and all other labs relatively within normal limits.  Urinalysis was abnormal with positive nitrites and rare bacteria. Chest x-ray was clear except for signs of COPD.  Patient had been given 1 L of normal saline IV fluid and then placed on a rate of 125 mL/h.  Urine cultures were sent.  Reports relief of heartburn symptoms after being given a GI cocktail.TRH called to admit for hyponatremia.    Review of Systems  Constitutional: Positive for malaise/fatigue. Negative for fever.  HENT: Negative for ear discharge and nosebleeds.   Eyes: Negative for double vision and photophobia.  Respiratory: Negative for shortness of breath and wheezing.   Cardiovascular: Negative for chest pain and leg swelling.  Gastrointestinal: Positive for heartburn. Negative for diarrhea and vomiting.  Genitourinary: Negative for dysuria and hematuria.  Musculoskeletal: Negative for falls.  Skin: Negative for itching.  Neurological: Positive for weakness. Negative for focal weakness and loss of consciousness.  Psychiatric/Behavioral: Negative for memory loss and substance abuse.    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 every 2 weeks last9-14-16 (Dr. Earlie Server), radiation last tx. 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   Persistent atrial fibrillation    Pulmonary nodules    Radiation 12/02/14-12/16/14   Left adrenal gland 30  Gy in 10 fractions    Past Surgical History:  Procedure Laterality Date   [place]   rt fx-hardware in   CARDIOVERSION N/A 09/29/2014   Procedure: CARDIOVERSION;  Surgeon: Josue Hector, MD;  Location: [facility] ENDOSCOPY;  Service: Cardiovascular;  Laterality: N/A;    CHOLECYSTECTOMY  09/08/2015    laproscopic    CHOLECYSTECTOMY N/A 09/08/2015   Procedure: LAPAROSCOPIC CHOLECYSTECTOMY WITH ATTEMPTED INTRAOPERATIVE CHOLANGIOGRAM ;  Surgeon: Fanny Skates, MD;  Location: [facility];  Service: General;  Laterality: N/A;   COLONOSCOPY     ERCP N/A 09/02/2015   Procedure: ENDOSCOPIC RETROGRADE CHOLANGIOPANCREATOGRAPHY (ERCP);  Surgeon: Arta Silence, MD;  Location: [facility] ENDOSCOPY;  Service: Endoscopy;  Laterality: N/A;   ERCP N/A 09/07/2015   Procedure: ENDOSCOPIC RETROGRADE CHOLANGIOPANCREATOGRAPHY (ERCP);  Surgeon: Clarene Essex, MD;  Location: [facility] ENDOSCOPY;  Service: Endoscopy;  Laterality: N/A;   EXCISION / CURETTAGE BONE CYST FINGER  2005   rt thumb   HEMORRHOID SURGERY     Percutaneous Cholecytostomy tube     IVR insertion 06-13-15(Dr. Vernard Gambles)- 08-28-15 remains in place to drainage bag   TONSILLECTOMY     TOTAL SHOULDER ARTHROPLASTY  02/03/2012   Procedure: TOTAL SHOULDER ARTHROPLASTY;  Surgeon: [name], MD;  Location: [facility];  Service: Orthopedics;  Laterality: Left;     reports that he quit smoking about 28 years ago. His smoking use included cigarettes. He has a 60.00 pack-year smoking history. He has never used smokeless tobacco. He reports current alcohol use. He reports that he does not use drugs.  No Known Allergies  Family History  Problem Relation Age of Onset   Lung cancer Mother    Hypertension Father    Heart attack Father    Diabetes Paternal Grandmother    Brain cancer Brother    Heart attack Brother    Neuropathy Neg Hx     Prior to Admission medications   Medication Sig Start Date End Date Taking? Authorizing Provider  ALPRAZolam (XANAX) 0.25 MG tablet TAKE 1 TABLET(0.25 MG) BY MOUTH AT BEDTIME AS NEEDED FOR ANXIETY 05/28/19   [name], MD  apixaban (ELIQUIS) 5 MG TABS tablet Take 5 mg by mouth 2 (two) times daily.    [provider]  Ascorbic Acid (VITAMIN C PO) Take 1,000 mg by  mouth daily.     [provider]  B Complex-C (SUPER B COMPLEX PO) Take 1 each by mouth daily.     [provider]  calcium carbonate (OS-CAL) 600 MG TABS Take 600 mg by mouth daily with breakfast.    [provider]  demeclocycline (DECLOMYCIN) 150 MG tablet Take 150 mg by mouth 2 (two) times daily.    [provider]  diltiazem (CARDIZEM CD) 180 MG 24 hr capsule Take 1 capsule (180 mg total) by mouth daily at 12 noon. 01/09/15   Sherran Needs, NP  furosemide (LASIX) 40 MG tablet Take 0.5 tablets (20 mg total) by mouth every other day. 07/21/16   Sherran Needs, NP  HYDROcodone-acetaminophen (NORCO/VICODIN) 5-325 MG tablet Take 1 tablet by mouth every 6 (six) hours as needed for moderate pain. 06/13/19   [name], MD  KLOR-CON M20 20 MEQ tablet Take 20 mEq by mouth every morning.  10/18/14   [provider]  Lactobacillus (ACIDOPHILUS) CAPS capsule Take 1 capsule by mouth daily.    [provider]  levothyroxine (SYNTHROID, LEVOTHROID) 50 MCG tablet TAKE 1 TABLET (50 MCG TOTAL) BY MOUTH  DAILY BEFORE BREAKFAST. 03/24/18   [name], MD  MAGNESIUM PO Take 400 mg by mouth daily.     [provider]  Multiple Vitamin (MULTIVITAMIN WITH MINERALS) TABS Take 1 tablet by mouth daily.    [provider]  Nivolumab (OPDIVO IV) Inject as directed every 2 weeks    [provider]  nivolumab 480 mg in sodium chloride 0.9 % 100 mL Patient states receives this only once a month, not Q 2 weeks.    [provider]  Nutritional Supplements (ENSURE COMPLETE PO) Take 1 Can by mouth 2 (two) times daily.    [provider]  Omega-3 Fatty Acids (FISH OIL PO) Take 1 tablet by mouth daily.    [provider]  pantoprazole (PROTONIX) 40 MG tablet Take 40 mg by mouth every morning. 06/02/15   [provider]  promethazine-dextromethorphan (PROMETHAZINE-DM) 6.25-15 MG/5ML syrup Take 5 mLs by mouth  3 (three) times daily as needed for cough. 05/09/19   Wieters, Hallie C, PA-C  tamsulosin (FLOMAX) 0.4 MG CAPS capsule Take 0.4 mg by mouth daily at 12 noon.     [provider]  thiamine (VITAMIN B-1) 100 MG tablet Take 1 tablet (100 mg total) by mouth daily. 09/11/13   Erick Colace, NP  vitamin E 400 UNIT capsule Take 400 Units by mouth daily.    [provider]    Physical Exam:  Constitutional: Elderly male who appears chronically ill NAD, calm, comfortable Vitals:   06/28/19 0730 06/28/19 0731 06/28/19 0745 06/28/19 1000  BP:  (!) 151/79 (!) 162/82 (!) 168/85  Pulse:  85 81 78  Resp:  16 15 15   Temp:      SpO2:  96% 96% 96%  Weight: 64.9 kg     Height: 5\' 11"  (1.803 m)      Eyes: PERRL, lids and conjunctivae normal ENMT: Mucous membranes are moist. Posterior pharynx clear of any exudate or lesions. .  Neck: normal, supple, no masses, no thyromegaly Respiratory: clear to auscultation bilaterally, no wheezing, no crackles. Normal respiratory effort. No accessory muscle use.  Cardiovascular: Regular rate and rhythm, no murmurs / rubs / gallops. No extremity edema. 2+ pedal pulses. No carotid bruits.  Abdomen: no tenderness, no masses palpated. No hepatosplenomegaly. Bowel sounds positive.  Musculoskeletal: no clubbing / cyanosis. No joint deformity upper and lower extremities. Good ROM, no contractures. Normal muscle tone.  Skin: no rashes, lesions, ulcers. No induration.  Skin turgor at this time appears within normal limits. Neurologic: CN 2-12 grossly intact. Sensation intact, DTR normal. Strength 5/5 in all 4.  Psychiatric: Normal judgment and insight. Alert and oriented x 3. Normal mood.     Labs on Admission: I have personally reviewed following labs and imaging studies  CBC: Recent Labs  Lab 06/28/19 0646  WBC 5.2  NEUTROABS 3.5  HGB 13.9  HCT 39.3  MCV 90.3  PLT 185   Basic Metabolic Panel: Recent Labs  Lab 06/28/19 0646  NA 125*  K 3.7    CL 90*  CO2 24  GLUCOSE 104*  BUN 7*  CREATININE 0.72  CALCIUM 9.0   GFR: Estimated Creatinine Clearance: 68.7 mL/min (by C-G formula based on SCr of 0.72 mg/dL). Liver Function Tests: Recent Labs  Lab 06/28/19 0646  AST 21  ALT 15  ALKPHOS 77  BILITOT 0.9  PROT 5.9*  ALBUMIN 3.6   Recent Labs  Lab 06/28/19 0646  LIPASE 29   No results for input(s): AMMONIA  in the last 168 hours. Coagulation Profile: Recent Labs  Lab 06/28/19 0646  INR 1.1   Cardiac Enzymes: No results for input(s): CKTOTAL, CKMB, CKMBINDEX, TROPONINI in the last 168 hours. BNP (last 3 results) No results for input(s): PROBNP in the last 8760 hours. HbA1C: No results for input(s): HGBA1C in the last 72 hours. CBG: No results for input(s): GLUCAP in the last 168 hours. Lipid Profile: No results for input(s): CHOL, HDL, LDLCALC, TRIG, CHOLHDL, LDLDIRECT in the last 72 hours. Thyroid Function Tests: No results for input(s): TSH, T4TOTAL, FREET4, T3FREE, THYROIDAB in the last 72 hours. Anemia Panel: No results for input(s): VITAMINB12, FOLATE, FERRITIN, TIBC, IRON, RETICCTPCT in the last 72 hours. Urine analysis:    Component Value Date/Time   COLORURINE AMBER (A) 06/28/2019 0702   APPEARANCEUR CLEAR 06/28/2019 0702   LABSPEC 1.004 (L) 06/28/2019 0702   PHURINE 9.0 (H) 06/28/2019 0702   GLUCOSEU NEGATIVE 06/28/2019 0702   HGBUR NEGATIVE 06/28/2019 0702   BILIRUBINUR NEGATIVE 06/28/2019 0702   KETONESUR NEGATIVE 06/28/2019 0702   PROTEINUR NEGATIVE 06/28/2019 0702   UROBILINOGEN 0.2 09/10/2015 0940   NITRITE POSITIVE (A) 06/28/2019 0702   LEUKOCYTESUR NEGATIVE 06/28/2019 0702   Sepsis Labs: No results found for this or any previous visit (from the past 240 hour(s)).   Radiological Exams on Admission: Dg Chest Port 1 View  Result Date: 06/28/2019 CLINICAL DATA:  Chest pain EXAM: PORTABLE CHEST 1 VIEW COMPARISON:  05/09/2019 FINDINGS: Normal heart size and stable mediastinal contours.  Hyperinflation and mild lung scarring. There is no edema, consolidation, effusion, or pneumothorax. Remote bilateral rib fractures. Remote right humeral neck fracture. Lucency over the lateral right chest wall is attributed to overlapping material. IMPRESSION: No acute finding when compared with priors. COPD. Electronically Signed   By: Monte Fantasia M.D.   On: 06/28/2019 07:15    EKG: Independently reviewed.  Sinus rhythm at 87 bpm with prolonged PR interval and QTC 427  Assessment/Plan Hyponatremia: Acute on chronic.  Patient with a sodium of 125 previously 127 earlier in the month.  Patient currently on demeclocycline as he has a known history of SIADH.  Question with recent antibiotic use as cause of symptoms or acute worsening of sodium levels.  He was given 1 L normal saline IV fluids in ED reports some mild improvement of symptoms. -Admit to a MedSurg bed -Strict I&Os -Check urine osmolarity/urine sodium -Normal saline to 75 mL/h -BMP every 6 hours x 3 -Goal not to increase sodium more than 10 mEq over 24 hours. -Continue demeclocycline and adjust dose if needed  Generalized weakness with gait disturbance: On admission patient reported feeling dizzy and fatigued.  Orthostatics noted to be positive.  Patient receiving 1 L normal saline IV fluids.  Symptoms could be related with recent use of ciprofloxacin for UTI/prostatitis and/or patient's current cancer treatments. -Follow-up COVID screening -IV fluids as seen above -Physical therapy to evaluate and treat   Lactic acidosis: Acute.  Initial lactic acid 0.8, but repeat elevated at 2.4.  Unclear cause for the elevation. -Continue to trend lactic acid level  Abnormal urinalysis: Patient reports just recently been treated for a prostate infection with a 10-day course of antibiotics just completed yesterday.  Urinalysis was still positive for nitrites and rare bacteria with no significant WBCs.  Patient denies having any current symptoms  at this time.  Suspect patient was otherwise adequately treated . -Follow-up urine culture   Paroxysmal atrial fibrillation, on chronic anticoagulation: Patient currently in sinus rhythm. -  Continue Cardizem and Eliquis   Small cell lung cancer and adenocarcinoma of the adrenal gland: Patient currently followed by Dr.Mohamed oncology.  Patient previously treated with chemotherapy and palliative radiotherapy.  Currently on monthly chemotherapy treatments of Nivolumab given last on 7/7. -Hydrocodone as needed for pain  -Continue outpatient follow-up with Dr. [name]  Essential hypertension -Continue current home medication  -Hydralazine IV as needed  Hypothyroidism: TSH was noted to be 2.526 on 06/11/2019. -Continue levothyroxine  GERD: Acute on chronic.  Patient reports having increased heartburn than usual.  Given Maalox and lidocaine with improvement of symptoms in the ED.  Could be related to ciprofloxacin. -Continue GI cocktail as needed -Started protonix  BPH -Continue Flomax  DVT prophylaxis: Eliquis Code Status: Full Family Communication: to be updated Disposition Plan: Likely discharge home in a.m. if medically stable Consults called: None Admission status: Observation  Norval Morton MD [facility] Pager [phone]   If 7PM-7AM, please contact night-coverage [email or web address] Password [facility]  06/28/2019, 10:19 AM

## 2019-06-28 NOTE — ED Notes (Signed)
Blood work already collected by lab

## 2019-06-28 NOTE — Progress Notes (Signed)
Pt admitted to 5W01 from ED. A&O. Skin intact. VS stable. IV L FA, clean dry intact, NS @ 75. Ambulatory w/ walker. All questions/concerns addressed. Call bell within reach. Will continue to monitor.

## 2019-06-28 NOTE — ED Notes (Signed)
X-ray at bedside

## 2019-06-28 NOTE — Evaluation (Signed)
Physical Therapy Evaluation Patient Details Name: Anthony Curry. MRN: [id number] DOB: 07/22/1941 Today's Date: 06/28/2019   History of Present Illness  Patient is a 78 y/o male who presents with weakness and dehydration. Found to have hyponatremia and UTI. PMH includes HTN, stage IV non small cell lung ca undergoing chemo, hx of alcohol abuse, adrenal mass, A-fib.  Clinical Impression  Patient presents with generalized weakness, impaired sensation BLEs (secondary to neuropathy), impaired balance and impaired mobility s/p above. Pt Mod I using 3 wheeled walker PTA and lives with wife. Today, pt tolerated transfers and gait training with Min guard-Min A for balance/safety. Pt with increased, unsafe gait speed with LEs running into walker legs, drifting to right/left and instability. Cues to decrease speed. Reports dizziness worsened with mobility. Will follow acutely to maximize independence and mobility prior to return home. Reports having issues getting started on an exercise program and is discouraged about his overall fatigue.    Follow Up Recommendations Home health PT;Supervision - Intermittent    Equipment Recommendations  None recommended by PT    Recommendations for Other Services       Precautions / Restrictions Precautions Precautions: Fall Restrictions Weight Bearing Restrictions: No      Mobility  Bed Mobility Overal bed mobility: Needs Assistance Bed Mobility: Supine to Sit;Sit to Supine     Supine to sit: Modified independent (Device/Increase time);HOB elevated Sit to supine: Modified independent (Device/Increase time);HOB elevated   General bed mobility comments: No assist needed; use of rail.  Transfers Overall transfer level: Needs assistance Equipment used: Rolling walker (2 wheeled) Transfers: Sit to/from Stand Sit to Stand: Min guard         General transfer comment: Min guard for safety. Slightly impulsive. Stood from [employer].  Ambulation/Gait Ambulation/Gait assistance: Min assist;Min guard Gait Distance (Feet): 150 Feet Assistive device: Rolling walker (2 wheeled) Gait Pattern/deviations: Step-through pattern;Trunk flexed;Shuffle;Drifts right/left;Wide base of support Gait velocity: too fast   General Gait Details: Very fast, shuffling like gait with difficulty staying within RW with LEs running into walker legs. Almost tripping over walker on 2 occasions. Drifting right/left fast. Reports dizziness worsened with ambulation.  Stairs            Wheelchair Mobility    Modified Rankin (Stroke Patients Only)       Balance Overall balance assessment: Needs assistance Sitting-balance support: Feet supported;No upper extremity supported Sitting balance-Leahy Scale: Good Sitting balance - Comments: Able to donn shoes sitting EOB bending over   Standing balance support: During functional activity Standing balance-Leahy Scale: Poor Standing balance comment: Needs UE support for standing balance/walking.                             Pertinent Vitals/Pain Pain Assessment: No/denies pain    Home Living Family/patient expects to be discharged to:: Private residence Living Arrangements: Spouse/significant other Available Help at Discharge: Family;Available 24 hours/day Type of Home: House Home Access: Level entry     Home Layout: Two level;Able to live on main level with bedroom/bathroom Home Equipment: Gilford Rile - 2 wheels;Cane - single point(3 wheeled walker)      Prior Function Level of Independence: Independent with assistive device(s)         Comments: Uses 3 wheeled walker for ambulation. Reports no falls. Reports dizziness constantly at home; always has to hold onto something. Household ambulator.     Hand Dominance  Extremity/Trunk Assessment   Upper Extremity Assessment Upper Extremity Assessment: Defer to OT evaluation    Lower Extremity Assessment Lower  Extremity Assessment: Generalized weakness RLE Sensation: decreased light touch;history of peripheral neuropathy LLE Sensation: decreased light touch;history of peripheral neuropathy       Communication   Communication: No difficulties  Cognition Arousal/Alertness: Awake/alert Behavior During Therapy: WFL for tasks assessed/performed Overall Cognitive Status: Impaired/Different from baseline Area of Impairment: Awareness                         Safety/Judgement: Decreased awareness of safety;Decreased awareness of deficits Awareness: Emergent   General Comments: Question awareness of safety/deficits. Reports being dizzy all the time at home but then states he gets up without his walker. Walks at an unsafe fast speed.      General Comments      Exercises     Assessment/Plan    PT Assessment Patient needs continued PT services  PT Problem List Decreased strength;Decreased mobility;Decreased safety awareness;Decreased activity tolerance;Decreased cognition;Impaired sensation;Decreased balance;Decreased knowledge of use of DME       PT Treatment Interventions Therapeutic activities;Gait training;Therapeutic exercise;Patient/family education;Balance training;Stair training;Functional mobility training;DME instruction    PT Goals (Current goals can be found in the Care Plan section)  Acute Rehab PT Goals Patient Stated Goal: to get comfortable and get home PT Goal Formulation: With patient Time For Goal Achievement: 07/12/19 Potential to Achieve Goals: Good    Frequency Min 3X/week   Barriers to discharge        Co-evaluation               AM-PAC PT "6 Clicks" Mobility  Outcome Measure Help needed turning from your back to your side while in a flat bed without using bedrails?: A Little Help needed moving from lying on your back to sitting on the side of a flat bed without using bedrails?: A Little Help needed moving to and from a bed to a chair  (including a wheelchair)?: A Little Help needed standing up from a chair using your arms (e.g., wheelchair or bedside chair)?: A Little Help needed to walk in hospital room?: A Little Help needed climbing 3-5 steps with a railing? : A Little 6 Click Score: 18    End of Session Equipment Utilized During Treatment: Gait belt Activity Tolerance: Patient tolerated treatment well Patient left: in bed;with call bell/phone within reach Nurse Communication: Mobility status PT Visit Diagnosis: Difficulty in walking, not elsewhere classified (R26.2);Muscle weakness (generalized) (M62.81)    Time: 5638-7564 PT Time Calculation (min) (ACUTE ONLY): 28 min   Charges:   PT Evaluation $PT Eval Moderate Complexity: 1 Mod PT Treatments $Gait Training: 8-22 mins        Wray Kearns, PT, DPT Acute Rehabilitation Services Pager [phone] Office [place] 06/28/2019, 5:01 PM

## 2019-06-28 NOTE — ED Provider Notes (Signed)
77yM with generalized fatigue and dizziness. Suspect may be related to hyponatremia, deconditioning, side effects of treatment, etc.  SIADH? Progressively worsening despite demeclocycline. Non-small cell lung CA and also adrenal carcinoma. He is not hyperkalemic though which would argue against adrenal insufficiency as cause of hypoNa.   Recently tx'd for UTI. Still nitrate positive and rare bacteria on UA. Will send culture. Has no specific urinary complaints currently aside from urinating a lot in the past day but he says he has also been drinking large quantities of pedialyte.  Substernal burning like GERD. Sounds atypical for ACS and now completely resolved after GI cocktail.    Virgel Manifold, MD 06/28/19 [phone]

## 2019-06-29 ENCOUNTER — Encounter (HOSPITAL_COMMUNITY): Payer: Self-pay | Admitting: Emergency Medicine

## 2019-06-29 ENCOUNTER — Other Ambulatory Visit: Payer: Self-pay

## 2019-06-29 ENCOUNTER — Observation Stay (HOSPITAL_BASED_OUTPATIENT_CLINIC_OR_DEPARTMENT_OTHER)
Admission: EM | Admit: 2019-06-29 | Discharge: 2019-07-01 | Disposition: A | Discharge status: Home / Self Care | Payer: Medicare Other | Source: Home / Self Care | Attending: Emergency Medicine | Admitting: Emergency Medicine

## 2019-06-29 DIAGNOSIS — R0902 Hypoxemia: Secondary | ICD-10-CM | POA: Diagnosis not present

## 2019-06-29 DIAGNOSIS — I10 Essential (primary) hypertension: Secondary | ICD-10-CM | POA: Diagnosis present

## 2019-06-29 DIAGNOSIS — E871 Hypo-osmolality and hyponatremia: Secondary | ICD-10-CM | POA: Diagnosis not present

## 2019-06-29 DIAGNOSIS — R42 Dizziness and giddiness: Secondary | ICD-10-CM | POA: Diagnosis not present

## 2019-06-29 DIAGNOSIS — C3490 Malignant neoplasm of unspecified part of unspecified bronchus or lung: Secondary | ICD-10-CM | POA: Diagnosis present

## 2019-06-29 DIAGNOSIS — E222 Syndrome of inappropriate secretion of antidiuretic hormone: Secondary | ICD-10-CM | POA: Diagnosis present

## 2019-06-29 DIAGNOSIS — R270 Ataxia, unspecified: Secondary | ICD-10-CM

## 2019-06-29 LAB — CBC
HCT: 36.8 % — ABNORMAL LOW (ref 39.0–52.0)
HCT: 38.6 % — ABNORMAL LOW (ref 39.0–52.0)
Hemoglobin: 13.3 g/dL (ref 13.0–17.0)
Hemoglobin: 13.5 g/dL (ref 13.0–17.0)
MCH: 32.2 pg (ref 26.0–34.0)
MCH: 31.8 pg (ref 26.0–34.0)
MCHC: 36.1 g/dL — ABNORMAL HIGH (ref 30.0–36.0)
MCHC: 35 g/dL (ref 30.0–36.0)
MCV: 89.1 fL (ref 80.0–100.0)
MCV: 91 fL (ref 80.0–100.0)
Platelets: 233 10*3/uL (ref 150–400)
Platelets: 222 10*3/uL (ref 150–400)
RBC: 4.13 MIL/uL — ABNORMAL LOW (ref 4.22–5.81)
RBC: 4.24 MIL/uL (ref 4.22–5.81)
RDW: 12.3 % (ref 11.5–15.5)
RDW: 12.4 % (ref 11.5–15.5)
WBC: 5.4 10*3/uL (ref 4.0–10.5)
WBC: 7.2 10*3/uL (ref 4.0–10.5)
nRBC: 0 % (ref 0.0–0.2)
nRBC: 0 % (ref 0.0–0.2)

## 2019-06-29 LAB — COMPREHENSIVE METABOLIC PANEL
ALT: 17 U/L (ref 0–44)
AST: 22 U/L (ref 15–41)
Albumin: 3.6 g/dL (ref 3.5–5.0)
Alkaline Phosphatase: 105 U/L (ref 38–126)
Anion gap: 7 (ref 5–15)
BUN: 14 mg/dL (ref 8–23)
CO2: 24 mmol/L (ref 22–32)
Calcium: 8.9 mg/dL (ref 8.9–10.3)
Chloride: 96 mmol/L — ABNORMAL LOW (ref 98–111)
Creatinine, Ser: 0.65 mg/dL (ref 0.61–1.24)
GFR calc Af Amer: >60 mL/min (ref >60)
GFR calc non Af Amer: >60 mL/min (ref >60)
Glucose, Bld: 86 mg/dL (ref 70–99)
Potassium: 3.7 mmol/L (ref 3.5–5.1)
Sodium: 127 mmol/L — ABNORMAL LOW (ref 135–145)
Total Bilirubin: 0.7 mg/dL (ref 0.3–1.2)
Total Protein: 5.7 g/dL — ABNORMAL LOW (ref 6.5–8.1)

## 2019-06-29 LAB — BASIC METABOLIC PANEL
Anion gap: 7 (ref 5–15)
BUN: 6 mg/dL — ABNORMAL LOW (ref 8–23)
CO2: 24 mmol/L (ref 22–32)
Calcium: 8.9 mg/dL (ref 8.9–10.3)
Chloride: 98 mmol/L (ref 98–111)
Creatinine, Ser: 0.63 mg/dL (ref 0.61–1.24)
GFR calc Af Amer: >60 mL/min (ref >60)
GFR calc non Af Amer: >60 mL/min (ref >60)
Glucose, Bld: 88 mg/dL (ref 70–99)
Potassium: 3.9 mmol/L (ref 3.5–5.1)
Sodium: 129 mmol/L — ABNORMAL LOW (ref 135–145)

## 2019-06-29 LAB — URINE CULTURE: Culture: NO GROWTH

## 2019-06-29 LAB — MAGNESIUM: Magnesium: 1.7 mg/dL (ref 1.7–2.4)

## 2019-06-29 LAB — TROPONIN I (HIGH SENSITIVITY): Troponin I (High Sensitivity): 14 ng/L (ref <18)

## 2019-06-29 MED ORDER — SODIUM CHLORIDE 0.9 % IV SOLN
1000.0000 mL | INTRAVENOUS | Status: DC
  Administered 2019-06-29 (×2): 1000 mL via INTRAVENOUS

## 2019-06-29 MED ORDER — SODIUM CHLORIDE 0.9% FLUSH
3.0000 mL | Freq: Once | INTRAVENOUS | Status: AC
  Administered 2019-06-30: 3 mL via INTRAVENOUS

## 2019-06-29 MED ORDER — SODIUM CHLORIDE 1 G PO TABS: 1.0000 g | ORAL_TABLET | Freq: Three times a day (TID) | ORAL | 0 refills | Status: DC

## 2019-06-29 NOTE — TOC Transition Note (Signed)
Transition of Care [facility]) - CM/SW Discharge Note   Patient Details  Name: Anthony Curry. MRN: [id number] Date of Birth: [birth year]  [facility] CM/SW Contact:  Carles Collet, RN Phone Number: 06/29/2019, 2:25 PM   Clinical Narrative:   Spoke w patient at bedside. He is agreeable to [facility] services. He does not have a preference. Reviewed HH scores with him. Referral made to [facility]. No other CM needs.     Final next level of care: [place] Barriers to Discharge: No Barriers Identified   Patient Goals and CMS Choice Patient states their goals for this hospitalization and ongoing recovery are:: to return home CMS Medicare.gov Compare Post Acute Care list provided to:: Patient Choice offered to / list presented to : Patient  Discharge Placement                       Discharge Plan and Services                          HH Arranged: PT [facility] Agency: [place]        Social Determinants of Health (SDOH) Interventions     Readmission Risk Interventions No flowsheet data found.

## 2019-06-29 NOTE — Discharge Instructions (Signed)
Anthony Curry.,  You are in the hospital because of low sodium.  This is likely secondary to a process called SIADH which is likely secondary to your lung cancer.  I would like you to restrict your fluid intake in addition to taking salt tablets.  Please follow-up with your primary care for repeat lab work on Monday, July 27.  Please return if any worsening symptoms of confusion, nausea, vomiting.

## 2019-06-29 NOTE — Care Management Obs Status (Signed)
[facility] NOTIFICATION   Patient Details  Name: Anthony Curry. MRN: [id number] Date of Birth: [birth year]   Medicare Observation Status Notification Given:  Yes    Carles Collet, RN 06/29/2019, 2:21 PM

## 2019-06-29 NOTE — Progress Notes (Signed)
Pt BP elevated. MD notified.

## 2019-06-29 NOTE — Progress Notes (Signed)
IVF disconnected.

## 2019-06-29 NOTE — ED Triage Notes (Addendum)
Per EMS- pt was DC'd today at 5pm after admit for hyponatremia with dehydration/weakness. States he feels no better. CBG 146. 94% on room air. Pt also reports he had epigastric pain yesterday relieved with GI cocktail that came back today when the dizziness got worse at home.

## 2019-06-29 NOTE — Progress Notes (Signed)
NP on call notified about BMP results, Sodium 125 ( previous value 128). NS increased from 75 ml/hr to 100 ml/hr.

## 2019-06-29 NOTE — Discharge Summary (Signed)
Physician Discharge Summary  Anthony Curry. ZOX:[id number] DOB: [birth year] DOA: 06/28/2019  PCP: [name], MD  Admit date: 06/28/2019 Discharge date: 06/29/2019  Admitted From: Home Disposition: Home  Recommendations for Outpatient Follow-up:  1. Follow up with PCP in 1 week 2. Please obtain BMP in two days 3. Please follow up on the following pending results: None  Home Health: PT Equipment/Devices: None  Discharge Condition: Stable CODE STATUS: Full code Diet recommendation: Fluid restricted   Brief/Interim Summary:  Admission HPI written by Norval Morton, MD   HPI: Anthony Curry. is a 78 y.o. 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 [name] of oncology, paroxysmal atrial fibrillation on Eliquis, hypertension, chronic hyponatremia, anxiety, and BPH; who presents with complaints of generalized weakness over the last several days.  However patient reports symptoms really bad in the last 2 days.  Notes associated symptoms of really bad heartburn, feeling unsteady on his feet, dizziness, and decreased p.o. intake.  He had went to his doctor last week and just completed a 10-day course of antibiotics yesterday for what he reports as a prostate infection.  Denies having any fever, recent falls, chest pain, and previous urinary symptoms have resolved.  He had tried drinking 3 bottles of Pedialyte as his primary doctor had said that he was dehydrated, but did not note any improvement in symptoms.  Due to persistence of symptoms and reports of inability to ambulate safely today his PCP called EMS to pick him up to bring him to the hospital.   ED Course: Upon admission into the emergency department patient was noted to be afebrile, blood pressurr 151/79-179/80, and all other vitals within normal limits.  Labs revealed sodium 125, chloride 90, lactic acid  0.8->2.4, and all other labs relatively within normal limits.  Urinalysis was abnormal with positive nitrites and rare bacteria. Chest x-ray was clear except for signs of COPD.  Patient had been given 1 L of normal saline IV fluid and then placed on a rate of 125 mL/h.  Urine cultures were sent. Reports relief of heartburn symptoms after being given a GI cocktail.TRH called to admit for hyponatremia.     Hospital course:  Hyponatremia Concern for SIADH in setting of known lung cancer. History of SIADH. Worsened with increased fluid intake as an outpatient. Treated with normal saline via IV. Discharging on fluid restriction and salt tablets with recommendation for repeat metabolic panel in two days. Could consider adrenal workup, however, blood pressure actually elevated during admission.  Generalized weakness Likely in part secondary to hyponatremia  Abnormal urinalysis Urine culture with no growth. Patient recently treated with a 10 day course of antibiotics.  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  Essential hypertension Continue diltiazem  Hypothyroidism Continue Synthroid  BPH Continue Flomax   Discharge Diagnoses:  Principal Problem:   Hyponatremia Active Problems:   BPH (benign prostatic hyperplasia)   Lung cancer (HCC)   Atrial fibrillation (HCC)   Hypertension   Generalized weakness   Lactic acidosis   Abnormal urinalysis    Discharge Instructions  Discharge Instructions    Call MD for:  difficulty breathing, headache or visual disturbances   Complete by: As directed    Call MD for:  extreme fatigue   Complete by: As directed    Call MD for:  persistant dizziness or light-headedness  Complete by: As directed    Increase activity slowly   Complete by: As directed      Allergies as of 06/29/2019   No Known Allergies     Medication List    STOP taking these medications    ciprofloxacin 500 MG tablet Commonly known as: CIPRO   furosemide 40 MG tablet Commonly known as: LASIX   promethazine-dextromethorphan 6.25-15 MG/5ML syrup Commonly known as: PROMETHAZINE-DM     TAKE these medications   Acidophilus Caps capsule Take 1 capsule by mouth daily.   ALPRAZolam 0.25 MG tablet Commonly known as: XANAX TAKE 1 TABLET(0.25 MG) BY MOUTH AT BEDTIME AS NEEDED FOR ANXIETY What changed: See the new instructions.   calcium carbonate 600 MG Tabs tablet Commonly known as: OS-CAL Take 600 mg by mouth daily with breakfast.   demeclocycline 150 MG tablet Commonly known as: DECLOMYCIN Take 150-300 mg by mouth See admin instructions. Take 300 mg  In the morning and 150 mg in the evening   diltiazem 180 MG 24 hr capsule Commonly known as: CARDIZEM CD Take 1 capsule (180 mg total) by mouth daily at 12 noon.   Eliquis 5 MG Tabs tablet Generic drug: apixaban Take 5 mg by mouth 2 (two) times daily.   ENSURE COMPLETE PO Take 1 Can by mouth 2 (two) times daily.   FISH OIL PO Take 1 tablet by mouth daily.   HYDROcodone-acetaminophen 5-325 MG tablet Commonly known as: NORCO/VICODIN Take 1 tablet by mouth every 6 (six) hours as needed for moderate pain.   Klor-Con M20 20 MEQ tablet Generic drug: potassium chloride SA Take 20 mEq by mouth every morning.   levothyroxine 50 MCG tablet Commonly known as: SYNTHROID TAKE 1 TABLET (50 MCG TOTAL) BY MOUTH DAILY BEFORE BREAKFAST.   MAGNESIUM PO Take 400 mg by mouth daily.   multivitamin with minerals Tabs tablet Take 1 tablet by mouth daily.   OPDIVO IV Inject as directed every 2 weeks   sodium chloride 1 g tablet Take 1 tablet (1 g total) by mouth 3 (three) times daily for 5 days.   SUPER B COMPLEX PO Take 1 each by mouth daily.   tamsulosin 0.4 MG Caps capsule Commonly known as: FLOMAX Take 0.4 mg by mouth daily at 12 noon.   thiamine 100 MG tablet Commonly known as: VITAMIN B-1 Take 1 tablet (100  mg total) by mouth daily.   VITAMIN C PO Take 1,000 mg by mouth daily.   vitamin E 400 UNIT capsule Take 400 Units by mouth daily.      Follow-up Information    [name], MD. Schedule an appointment as soon as possible for a visit in 1 week(s).   Specialty: Family Medicine Why: Call for lab check on 7/27 Contact information: [place] [address] [place], [facility] Follow up.   Why: For home health services, they will call you in 1-2 days to set up your first appointment Contact information: [place] [address] [phone]          No Known Allergies  Consultations:  None   Procedures/Studies: Dg Chest Port 1 View  Result Date: 06/28/2019 CLINICAL DATA:  Chest pain EXAM: PORTABLE CHEST 1 VIEW COMPARISON:  05/09/2019 FINDINGS: Normal heart size and stable mediastinal contours. Hyperinflation and mild lung scarring. There is no edema, consolidation, effusion, or pneumothorax. Remote bilateral rib fractures. Remote right humeral neck fracture.  Lucency over the lateral right chest wall is attributed to overlapping material. IMPRESSION: No acute finding when compared with priors. COPD. Electronically Signed   By: Monte Fantasia M.D.   On: 06/28/2019 07:15       Subjective: Feels better today.  Discharge Exam: Vitals:   06/29/19 0656 06/29/19 1352  BP: (!) 170/75 (!) 164/85  Pulse: 67   Resp:    Temp:    SpO2:     Vitals:   06/28/19 2145 06/29/19 0644 06/29/19 0656 06/29/19 1352  BP: (!) 143/74 (!) 169/76 (!) 170/75 (!) 164/85  Pulse: 74 70 67   Resp: 12     Temp: 97.8 F (36.6 C) 97.9 F (36.6 C)    TempSrc: Oral Oral    SpO2: 98% 100%    Weight:      Height:        General: Pt is alert, awake, not in acute distress Cardiovascular: RRR, S1/S2 +, no rubs, no gallops Respiratory: CTA bilaterally, no wheezing, no rhonchi Abdominal: Soft, NT, ND, bowel sounds +  Extremities: no edema, no cyanosis    The results of significant diagnostics from this hospitalization (including imaging, microbiology, ancillary and laboratory) are listed below for reference.     Microbiology: Recent Results (from the past 240 hour(s))  Urine culture     Status: None   Collection Time: 06/28/19  7:02 AM   Specimen: Urine, Random  Result Value Ref Range Status   Specimen Description URINE, RANDOM  Final   Special Requests NONE  Final   Culture   Final    NO GROWTH Performed at [facility], [address] [place] [address] [place], [place] [address]    Report Status 06/29/2019 FINAL  Final  SARS Coronavirus 2 (CEPHEID - Performed in [facility] hospital lab), Hosp Order     Status: None   Collection Time: 06/28/19 11:35 AM   Specimen: Nasopharyngeal Swab  Result Value Ref Range Status   SARS Coronavirus 2 NEGATIVE NEGATIVE Final    Comment: (NOTE) If result is NEGATIVE SARS-CoV-2 target nucleic acids are NOT DETECTED. The SARS-CoV-2 RNA is generally detectable in upper and lower  respiratory specimens during the acute phase of infection. The lowest  concentration of SARS-CoV-2 viral copies this assay can detect is 250  copies / mL. A negative result does not preclude SARS-CoV-2 infection  and should not be used as the sole basis for treatment or other  patient management decisions.  A negative result may occur with  improper specimen collection / handling, submission of specimen other  than nasopharyngeal swab, presence of viral mutation(s) within the  areas targeted by this assay, and inadequate number of viral copies  (<250 copies / mL). A negative result must be combined with clinical  observations, patient history, and epidemiological information. If result is POSITIVE SARS-CoV-2 target nucleic acids are DETECTED. The SARS-CoV-2 RNA is generally detectable in upper and lower  respiratory specimens dur ing the acute phase of infection.  Positive  results are  indicative of active infection with SARS-CoV-2.  Clinical  correlation with patient history and other diagnostic information is  necessary to determine patient infection status.  Positive results do  not rule out bacterial infection or co-infection with other viruses. If result is PRESUMPTIVE POSTIVE SARS-CoV-2 nucleic acids MAY BE PRESENT.   A presumptive positive result was obtained on the submitted specimen  and confirmed on repeat testing.  While 2019 novel coronavirus  (SARS-CoV-2) nucleic acids may be present in  the submitted sample  additional confirmatory testing may be necessary for epidemiological  and / or clinical management purposes  to differentiate between  SARS-CoV-2 and other Sarbecovirus currently known to infect humans.  If clinically indicated additional testing with an alternate test  methodology [phone]) is advised. The SARS-CoV-2 RNA is generally  detectable in upper and lower respiratory sp ecimens during the acute  phase of infection. The expected result is Negative. Fact Sheet for Patients:  StrictlyIdeas.no Fact Sheet for Healthcare Providers: BankingDealers.co.za This test is not yet approved or cleared by the [place] FDA and has been authorized for detection and/or diagnosis of SARS-CoV-2 by FDA under an Emergency Use Authorization (EUA).  This EUA will remain in effect (meaning this test can be used) for the duration of the COVID-19 declaration under Section 564(b)(1) of the Act, 21 U.S.C. section 360bbb-3(b)(1), unless the authorization is terminated or revoked sooner. Performed at [facility], [place] [address], [place],  [address]      Labs: BNP (last 3 results) Recent Labs    06/28/19 0646  BNP 33.2   Basic Metabolic Panel: Recent Labs  Lab 06/28/19 0646 06/28/19 1324 06/28/19 1639 06/28/19 2254 06/29/19 0240 06/29/19 0820  NA 125* 128* 128* 125*  --  129*  K 3.7 3.7 3.9  3.9  --  3.9  CL 90* 96* 97* 96*  --  98  CO2 24 25 25  21*  --  24  GLUCOSE 104* 98 100* 90  --  88  BUN 7* 7* 9 6*  --  6*  CREATININE 0.72 0.65 0.66 0.58*  --  0.63  CALCIUM 9.0 8.9 8.5* 8.5*  --  8.9  MG  --   --   --   --  1.7  --    Liver Function Tests: Recent Labs  Lab 06/28/19 0646  AST 21  ALT 15  ALKPHOS 77  BILITOT 0.9  PROT 5.9*  ALBUMIN 3.6   Recent Labs  Lab 06/28/19 0646  LIPASE 29   No results for input(s): AMMONIA in the last 168 hours. CBC: Recent Labs  Lab 06/28/19 0646 06/29/19 0240  WBC 5.2 5.4  NEUTROABS 3.5  --   HGB 13.9 13.3  HCT 39.3 36.8*  MCV 90.3 89.1  PLT 260 233   Cardiac Enzymes: No results for input(s): CKTOTAL, CKMB, CKMBINDEX, TROPONINI in the last 168 hours. BNP: Invalid input(s): POCBNP CBG: No results for input(s): GLUCAP in the last 168 hours. D-Dimer No results for input(s): DDIMER in the last 72 hours. Hgb A1c No results for input(s): HGBA1C in the last 72 hours. Lipid Profile No results for input(s): CHOL, HDL, LDLCALC, TRIG, CHOLHDL, LDLDIRECT in the last 72 hours. Thyroid function studies No results for input(s): TSH, T4TOTAL, T3FREE, THYROIDAB in the last 72 hours.  Invalid input(s): FREET3 Anemia work up No results for input(s): VITAMINB12, FOLATE, FERRITIN, TIBC, IRON, RETICCTPCT in the last 72 hours. Urinalysis    Component Value Date/Time   COLORURINE AMBER (A) 06/28/2019 0702   APPEARANCEUR CLEAR 06/28/2019 0702   LABSPEC 1.004 (L) 06/28/2019 0702   PHURINE 9.0 (H) 06/28/2019 0702   GLUCOSEU NEGATIVE 06/28/2019 0702   HGBUR NEGATIVE 06/28/2019 0702   BILIRUBINUR NEGATIVE 06/28/2019 0702   KETONESUR NEGATIVE 06/28/2019 0702   PROTEINUR NEGATIVE 06/28/2019 0702   UROBILINOGEN 0.2 09/10/2015 0940   NITRITE POSITIVE (A) 06/28/2019 0702   LEUKOCYTESUR NEGATIVE 06/28/2019 0702   Sepsis Labs Invalid input(s): PROCALCITONIN,  WBC,  [place] [facility]  Recent Results (from the past 240 hour(s))   Urine culture     Status: None   Collection Time: 06/28/19  7:02 AM   Specimen: Urine, Random  Result Value Ref Range Status   Specimen Description URINE, RANDOM  Final   Special Requests NONE  Final   Culture   Final    NO GROWTH Performed at [facility], [address], [place], [place] [address]    Report Status 06/29/2019 FINAL  Final  SARS Coronavirus 2 (CEPHEID - Performed in [place] hospital lab), Hosp Order     Status: None   Collection Time: 06/28/19 11:35 AM   Specimen: Nasopharyngeal Swab  Result Value Ref Range Status   SARS Coronavirus 2 NEGATIVE NEGATIVE Final    Comment: (NOTE) If result is NEGATIVE SARS-CoV-2 target nucleic acids are NOT DETECTED. The SARS-CoV-2 RNA is generally detectable in upper and lower  respiratory specimens during the acute phase of infection. The lowest  concentration of SARS-CoV-2 viral copies this assay can detect is 250  copies / mL. A negative result does not preclude SARS-CoV-2 infection  and should not be used as the sole basis for treatment or other  patient management decisions.  A negative result may occur with  improper specimen collection / handling, submission of specimen other  than nasopharyngeal swab, presence of viral mutation(s) within the  areas targeted by this assay, and inadequate number of viral copies  (<250 copies / mL). A negative result must be combined with clinical  observations, patient history, and epidemiological information. If result is POSITIVE SARS-CoV-2 target nucleic acids are DETECTED. The SARS-CoV-2 RNA is generally detectable in upper and lower  respiratory specimens dur ing the acute phase of infection.  Positive  results are indicative of active infection with SARS-CoV-2.  Clinical  correlation with patient history and other diagnostic information is  necessary to determine patient infection status.  Positive results do  not rule out bacterial infection or co-infection with other  viruses. If result is PRESUMPTIVE POSTIVE SARS-CoV-2 nucleic acids MAY BE PRESENT.   A presumptive positive result was obtained on the submitted specimen  and confirmed on repeat testing.  While 2019 novel coronavirus  (SARS-CoV-2) nucleic acids may be present in the submitted sample  additional confirmatory testing may be necessary for epidemiological  and / or clinical management purposes  to differentiate between  SARS-CoV-2 and other Sarbecovirus currently known to infect humans.  If clinically indicated additional testing with an alternate test  methodology [phone]) is advised. The SARS-CoV-2 RNA is generally  detectable in upper and lower respiratory sp ecimens during the acute  phase of infection. The expected result is Negative. Fact Sheet for Patients:  StrictlyIdeas.no Fact Sheet for Healthcare Providers: BankingDealers.co.za This test is not yet approved or cleared by the [place] FDA and has been authorized for detection and/or diagnosis of SARS-CoV-2 by FDA under an Emergency Use Authorization (EUA).  This EUA will remain in effect (meaning this test can be used) for the duration of the COVID-19 declaration under Section 564(b)(1) of the Act, 21 U.S.C. section 360bbb-3(b)(1), unless the authorization is terminated or revoked sooner. Performed at [facility], [place] [address], [place], [place] [address]     SIGNED:   Cordelia Poche, MD [facility] 06/29/2019, 2:37 PM

## 2019-06-30 ENCOUNTER — Observation Stay (HOSPITAL_COMMUNITY): Payer: Medicare Other

## 2019-06-30 DIAGNOSIS — E222 Syndrome of inappropriate secretion of antidiuretic hormone: Secondary | ICD-10-CM | POA: Diagnosis not present

## 2019-06-30 DIAGNOSIS — E871 Hypo-osmolality and hyponatremia: Secondary | ICD-10-CM

## 2019-06-30 DIAGNOSIS — C3490 Malignant neoplasm of unspecified part of unspecified bronchus or lung: Secondary | ICD-10-CM | POA: Diagnosis not present

## 2019-06-30 DIAGNOSIS — I10 Essential (primary) hypertension: Secondary | ICD-10-CM | POA: Diagnosis not present

## 2019-06-30 DIAGNOSIS — R270 Ataxia, unspecified: Secondary | ICD-10-CM | POA: Diagnosis not present

## 2019-06-30 DIAGNOSIS — R9082 White matter disease, unspecified: Secondary | ICD-10-CM | POA: Diagnosis not present

## 2019-06-30 DIAGNOSIS — C7490 Malignant neoplasm of unspecified part of unspecified adrenal gland: Secondary | ICD-10-CM | POA: Diagnosis not present

## 2019-06-30 LAB — BASIC METABOLIC PANEL
Anion gap: 8 (ref 5–15)
BUN: 9 mg/dL (ref 8–23)
CO2: 24 mmol/L (ref 22–32)
Calcium: 8.7 mg/dL — ABNORMAL LOW (ref 8.9–10.3)
Chloride: 95 mmol/L — ABNORMAL LOW (ref 98–111)
Creatinine, Ser: 0.65 mg/dL (ref 0.61–1.24)
GFR calc Af Amer: >60 mL/min (ref >60)
GFR calc non Af Amer: >60 mL/min (ref >60)
Glucose, Bld: 107 mg/dL — ABNORMAL HIGH (ref 70–99)
Potassium: 3.8 mmol/L (ref 3.5–5.1)
Sodium: 127 mmol/L — ABNORMAL LOW (ref 135–145)

## 2019-06-30 LAB — URINALYSIS, ROUTINE W REFLEX MICROSCOPIC
Bilirubin Urine: NEGATIVE
Glucose, UA: NEGATIVE mg/dL
Hgb urine dipstick: NEGATIVE
Ketones, ur: NEGATIVE mg/dL
Leukocytes,Ua: NEGATIVE
Nitrite: NEGATIVE
Protein, ur: NEGATIVE mg/dL
Specific Gravity, Urine: 1.01 (ref 1.005–1.030)
pH: 7 (ref 5.0–8.0)

## 2019-06-30 LAB — TROPONIN I (HIGH SENSITIVITY): Troponin I (High Sensitivity): 17 ng/L (ref <18)

## 2019-06-30 MED ORDER — ACETAMINOPHEN 650 MG RE SUPP: 650.0000 mg | Freq: Four times a day (QID) | RECTAL | Status: DC | PRN

## 2019-06-30 MED ORDER — TAMSULOSIN HCL 0.4 MG PO CAPS
0.4000 mg | ORAL_CAPSULE | Freq: Every day | ORAL | Status: DC
  Administered 2019-06-30: 13:00:00 0.4 mg via ORAL
  Filled 2019-06-30: qty 1

## 2019-06-30 MED ORDER — BACID PO TABS
1.0000 | ORAL_TABLET | Freq: Every day | ORAL | Status: DC
  Administered 2019-07-01: 1 via ORAL
  Filled 2019-06-30 (×2): qty 1

## 2019-06-30 MED ORDER — VITAMIN E 180 MG (400 UNIT) PO CAPS
400.0000 [IU] | ORAL_CAPSULE | Freq: Every day | ORAL | Status: DC
  Administered 2019-07-01: 400 [IU] via ORAL
  Filled 2019-06-30 (×2): qty 1

## 2019-06-30 MED ORDER — MECLIZINE HCL 25 MG PO TABS
25.0000 mg | ORAL_TABLET | Freq: Once | ORAL | Status: AC
  Administered 2019-06-30: 01:00:00 25 mg via ORAL
  Filled 2019-06-30: qty 1

## 2019-06-30 MED ORDER — ALUM & MAG HYDROXIDE-SIMETH 200-200-20 MG/5ML PO SUSP
30.0000 mL | Freq: Once | ORAL | Status: AC
  Administered 2019-06-30: 30 mL via ORAL
  Filled 2019-06-30: qty 30

## 2019-06-30 MED ORDER — CALCIUM CARBONATE 1250 (500 CA) MG PO TABS
1250.0000 mg | ORAL_TABLET | Freq: Every day | ORAL | Status: DC
  Administered 2019-06-30 – 2019-07-01 (×2): 1250 mg via ORAL
  Filled 2019-06-30 (×2): qty 1

## 2019-06-30 MED ORDER — ONDANSETRON HCL 4 MG/2ML IJ SOLN: 4.0000 mg | Freq: Four times a day (QID) | INTRAMUSCULAR | Status: DC | PRN

## 2019-06-30 MED ORDER — GABAPENTIN 100 MG PO CAPS
200.0000 mg | ORAL_CAPSULE | Freq: Every day | ORAL | Status: DC
  Administered 2019-06-30: 200 mg via ORAL
  Filled 2019-06-30: qty 2

## 2019-06-30 MED ORDER — ALPRAZOLAM 0.25 MG PO TABS
0.2500 mg | ORAL_TABLET | Freq: Every day | ORAL | Status: DC
  Administered 2019-06-30: 22:00:00 0.25 mg via ORAL
  Filled 2019-06-30: qty 1

## 2019-06-30 MED ORDER — LEVOTHYROXINE SODIUM 50 MCG PO TABS
50.0000 ug | ORAL_TABLET | Freq: Every day | ORAL | Status: DC
  Administered 2019-06-30 – 2019-07-01 (×2): 50 ug via ORAL
  Filled 2019-06-30 (×2): qty 1

## 2019-06-30 MED ORDER — DEMECLOCYCLINE HCL 150 MG PO TABS: 150.0000 mg | ORAL_TABLET | ORAL | Status: DC

## 2019-06-30 MED ORDER — DILTIAZEM HCL ER COATED BEADS 180 MG PO CP24
180.0000 mg | ORAL_CAPSULE | Freq: Every day | ORAL | Status: DC
  Administered 2019-06-30: 180 mg via ORAL
  Filled 2019-06-30: qty 1

## 2019-06-30 MED ORDER — GADOBUTROL 1 MMOL/ML IV SOLN
7.0000 mL | Freq: Once | INTRAVENOUS | Status: AC | PRN
  Administered 2019-06-30: 7 mL via INTRAVENOUS

## 2019-06-30 MED ORDER — ACETAMINOPHEN 325 MG PO TABS: 650.0000 mg | ORAL_TABLET | Freq: Four times a day (QID) | ORAL | Status: DC | PRN

## 2019-06-30 MED ORDER — LIDOCAINE VISCOUS HCL 2 % MT SOLN
15.0000 mL | Freq: Four times a day (QID) | OROMUCOSAL | Status: DC | PRN
  Administered 2019-06-30: 15 mL via ORAL
  Filled 2019-06-30 (×2): qty 15

## 2019-06-30 MED ORDER — DEMECLOCYCLINE HCL 150 MG PO TABS
300.0000 mg | ORAL_TABLET | Freq: Every morning | ORAL | Status: DC
  Administered 2019-06-30 – 2019-07-01 (×2): 300 mg via ORAL
  Filled 2019-06-30 (×2): qty 2

## 2019-06-30 MED ORDER — SODIUM CHLORIDE 0.9 % IV BOLUS (SEPSIS)
500.0000 mL | Freq: Once | INTRAVENOUS | Status: AC
  Administered 2019-06-30: 500 mL via INTRAVENOUS

## 2019-06-30 MED ORDER — ONDANSETRON HCL 4 MG PO TABS: 4.0000 mg | ORAL_TABLET | Freq: Four times a day (QID) | ORAL | Status: DC | PRN

## 2019-06-30 MED ORDER — ADULT MULTIVITAMIN W/MINERALS CH
1.0000 | ORAL_TABLET | Freq: Every day | ORAL | Status: DC
  Administered 2019-06-30 – 2019-07-01 (×2): 1 via ORAL
  Filled 2019-06-30 (×2): qty 1

## 2019-06-30 MED ORDER — HYDROCODONE-ACETAMINOPHEN 5-325 MG PO TABS
1.0000 | ORAL_TABLET | Freq: Four times a day (QID) | ORAL | Status: DC | PRN
  Administered 2019-06-30: 1 via ORAL
  Filled 2019-06-30: qty 1

## 2019-06-30 MED ORDER — SODIUM CHLORIDE 1 G PO TABS
1.0000 g | ORAL_TABLET | Freq: Three times a day (TID) | ORAL | Status: DC
  Administered 2019-06-30 – 2019-07-01 (×3): 1 g via ORAL
  Filled 2019-06-30 (×6): qty 1

## 2019-06-30 MED ORDER — ENSURE ENLIVE PO LIQD
237.0000 mL | Freq: Two times a day (BID) | ORAL | Status: DC
  Administered 2019-06-30 – 2019-07-01 (×2): 237 mL via ORAL

## 2019-06-30 MED ORDER — POTASSIUM CHLORIDE CRYS ER 20 MEQ PO TBCR
20.0000 meq | EXTENDED_RELEASE_TABLET | Freq: Every morning | ORAL | Status: DC
  Administered 2019-06-30 – 2019-07-01 (×2): 20 meq via ORAL
  Filled 2019-06-30 (×2): qty 1

## 2019-06-30 MED ORDER — ALUM & MAG HYDROXIDE-SIMETH 200-200-20 MG/5ML PO SUSP
15.0000 mL | Freq: Four times a day (QID) | ORAL | Status: DC | PRN
  Administered 2019-06-30: 15 mL via ORAL
  Filled 2019-06-30: qty 30

## 2019-06-30 MED ORDER — VITAMIN B-1 100 MG PO TABS
100.0000 mg | ORAL_TABLET | Freq: Every day | ORAL | Status: DC
  Administered 2019-06-30 – 2019-07-01 (×2): 100 mg via ORAL
  Filled 2019-06-30 (×2): qty 1

## 2019-06-30 MED ORDER — APIXABAN 5 MG PO TABS
5.0000 mg | ORAL_TABLET | Freq: Two times a day (BID) | ORAL | Status: DC
  Administered 2019-06-30 – 2019-07-01 (×3): 5 mg via ORAL
  Filled 2019-06-30 (×3): qty 1

## 2019-06-30 MED ORDER — LIDOCAINE VISCOUS HCL 2 % MT SOLN
15.0000 mL | Freq: Once | OROMUCOSAL | Status: AC
  Administered 2019-06-30: 15 mL via ORAL
  Filled 2019-06-30: qty 15

## 2019-06-30 MED ORDER — DEMECLOCYCLINE HCL 150 MG PO TABS
150.0000 mg | ORAL_TABLET | Freq: Every evening | ORAL | Status: DC
  Administered 2019-06-30: 150 mg via ORAL
  Filled 2019-06-30 (×2): qty 1

## 2019-06-30 NOTE — ED Provider Notes (Signed)
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 [name] of oncology, paroxysmal atrial fibrillation on Eliquis, hypertension who presents to the emergency department with complaints of ataxia.  States that he has been feeling very weak and had having a hard time walking because he feels like he is going to fall over every time he gets up.  He states he does not feel lightheaded but more as if he is unsteady.  He denies vertigo.  No fevers, cough, chest pain, shortness of breath, vomiting, diarrhea.  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  No other new numbness or focal weakness.  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  States he was asked if he was ready to go home and he told them yes but states now he feels like "I jumped the gun".  States he normally ambulates with a walker but felt like all day today he was going to fall.  No head injury.  He states he is having some heartburn.  This was previously treated with a GI cocktail which helped his symptoms significantly.  ROS: See HPI Constitutional: no fever  Eyes: no drainage  ENT: no runny nose   Cardiovascular:  no chest pain  Resp: no SOB  GI: no vomiting GU: no dysuria Integumentary: no rash  Allergy: no hives  Musculoskeletal: no leg swelling  Neurological: no slurred speech ROS otherwise negative  PAST MEDICAL HISTORY/PAST SURGICAL HISTORY:  Past Medical History:  Diagnosis Date  . 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 Adrenal mass ([place])   . Alcoholism in remission ([place])   . Anemia of chronic disease   . 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 Anxiety   . BPH (benign prostatic hyperplasia)   . Cancer ([place])     small cell/lung  . Complication of anesthesia 09/08/2015    JITTERY AFTER GALLBLADDER SURGERY  . GERD (gastroesophageal reflux disease)   . History of radiation therapy 11/14/16, 11/18/16, 11/22/16   Left upper lung: 54 Gy in 3 fractions  . Hypertension   . Hypertension 12/07/2016  . Neoplastic malignant related fatigue    IV tx. every 2 weeks last9-14-16 (Dr. Earlie Server), radiation last tx. 6 months  . 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 Persistent atrial fibrillation   . Pulmonary nodules   . Radiation 12/02/14-12/16/14   Left adrenal gland 30 Gy in 10 fractions    MEDICATIONS:  Prior to Admission medications   Medication Sig Start Date End Date Taking? Authorizing Provider  ALPRAZolam (XANAX) 0.25 MG tablet TAKE 1 TABLET(0.25 MG) BY MOUTH AT BEDTIME AS NEEDED FOR ANXIETY Patient taking differently: Take 0.25 mg by mouth at bedtime.  05/28/19   [name], MD  apixaban (ELIQUIS) 5 MG TABS tablet Take 5 mg by mouth 2 (two) times daily.    [provider]  Ascorbic Acid (VITAMIN C PO) Take 1,000 mg by mouth daily.     [provider]  B Complex-C (SUPER B COMPLEX PO) Take 1 each by mouth daily.     [provider]  calcium carbonate (OS-CAL) 600 MG TABS Take 600 mg by mouth daily with breakfast.    [provider]  demeclocycline (DECLOMYCIN) 150 MG tablet Take 150-300 mg  by mouth See admin instructions. Take 300 mg  In the morning and 150 mg in the evening    [provider]  diltiazem (CARDIZEM CD) 180 MG 24 hr capsule Take 1 capsule (180 mg total) by mouth daily at 12 noon. 01/09/15   Sherran Needs, NP  HYDROcodone-acetaminophen (NORCO/VICODIN) 5-325 MG tablet Take 1 tablet by mouth every 6 (six) hours as needed for moderate pain. 06/13/19   [name], MD  KLOR-CON M20 20 MEQ tablet Take 20 mEq by mouth every morning.  10/18/14   [provider]  Lactobacillus (ACIDOPHILUS) CAPS capsule Take 1 capsule by mouth daily.     [provider]  levothyroxine (SYNTHROID, LEVOTHROID) 50 MCG tablet TAKE 1 TABLET (50 MCG TOTAL) BY MOUTH DAILY BEFORE BREAKFAST. 03/24/18   [name], MD  MAGNESIUM PO Take 400 mg by mouth daily.     [provider]  Multiple Vitamin (MULTIVITAMIN WITH MINERALS) TABS Take 1 tablet by mouth daily.    [provider]  Nivolumab (OPDIVO IV) Inject as directed every 2 weeks    [provider]  Nutritional Supplements (ENSURE COMPLETE PO) Take 1 Can by mouth 2 (two) times daily.    [provider]  Omega-3 Fatty Acids (FISH OIL PO) Take 1 tablet by mouth daily.    [provider]  sodium chloride 1 g tablet Take 1 tablet (1 g total) by mouth 3 (three) times daily for 5 days. 06/29/19 07/04/19  Mariel Aloe, MD  tamsulosin (FLOMAX) 0.4 MG CAPS capsule Take 0.4 mg by mouth daily at 12 noon.     [provider]  thiamine (VITAMIN B-1) 100 MG tablet Take 1 tablet (100 mg total) by mouth daily. 09/11/13   Erick Colace, NP  vitamin E 400 UNIT capsule Take 400 Units by mouth daily.    [provider]    ALLERGIES:  No Known Allergies  SOCIAL HISTORY:  Social History   Tobacco Use  . Smoking status: Former Smoker    Packs/day: 2.00    Years: 30.00    Pack years: 60.00    Types: Cigarettes    Quit date: 12/05/1990    Years since quitting: 28.5  . Smokeless tobacco: Never Used  Substance Use Topics  . Alcohol use: Yes    Alcohol/week: 0.0 standard drinks    Comment: past hx.ETOH abuse,08-31-15 "occ. wine ,drinks non alcoholic beer occ."    FAMILY HISTORY: Family History  Problem Relation Age of Onset  . Lung cancer Mother   . Hypertension Father   . Heart attack Father   . Diabetes Paternal Grandmother   . Brain cancer Brother   . Heart attack Brother   . Neuropathy Neg Hx     EXAM: BP (!) 154/72   Pulse 89   Temp 98.6 F (37 C)   Resp 19   SpO2 98%  CONSTITUTIONAL: Alert and oriented and  responds appropriately to questions.  Elderly, thin, chronically ill-appearing HEAD: Normocephalic EYES: Conjunctivae clear, pupils appear equal, EOMI ENT: normal nose; dry appearing mucous membranes NECK: Supple, no meningismus, no nuchal rigidity, no LAD  CARD: RRR; S1 and S2 appreciated; no murmurs, no clicks, no rubs, no gallops RESP: Normal chest excursion without splinting or tachypnea; breath sounds clear and equal bilaterally; no wheezes, no rhonchi, no rales, no hypoxia or respiratory distress, speaking full sentences ABD/GI: Normal bowel sounds; non-distended; soft, non-tender, no rebound, no guarding, no peritoneal signs, no hepatosplenomegaly BACK:  The  back appears normal and is non-tender to palpation, there is no CVA tenderness EXT: Normal ROM in all joints; non-tender to palpation; no edema; normal capillary refill; no cyanosis, no calf tenderness or swelling    SKIN: Normal color for age and race; warm; no rash NEURO: Moves all extremities equally, chronic neuropathy in bilateral lower extremities, sensation otherwise intact diffusely, cranial nerves II through XII intact, normal speech, no dysmetria to finger-to-nose testing bilaterally, no nystagmus on exam, strength 5/5 in all 4 extremities, patient has a positive Romberg and ataxic gait PSYCH: The patient's mood and manner are appropriate. Grooming and personal hygiene are appropriate.  MEDICAL DECISION MAKING: Patient here with ataxia.  Was just admitted for hyponatremia and weakness.  Thought secondary to SIADH.  Improved with some gentle IV fluids.  Discharged with a sodium level of 129.  Today is 127.  Discussed with patient that I am not convinced that it is hyponatremia causing his symptoms given he felt better with a sodium of 129 but states he feels poorly with a sodium of 127.  He is very ataxic on exam and I agree that I am concerned about him being at home as I feel he is a high fall risk and he is on Eliquis.  He states  his ataxia is new.  He denies any vertigo and has no nystagmus or dysmetria on exam.  This has been intermittent for several days.  Discussed with patient that this could be a stroke but I am also worried about metastasis given his history of cancer.  I recommended an MRI of the brain with and without contrast and he agrees.  Will give meclizine to see if this helps with symptoms at all.  His work appears otherwise unremarkable.  Normal hemoglobin.  Negative troponin.  His urinalysis during admission did show nitrites but urine culture was negative.  Will repeat urine today.  He states he does not feel safe going home and would like to be readmitted.  Will discuss with hospitalist.  ED PROGRESS: 1:10 AM Discussed patient's case with hospitalist, Dr. Alcario Drought.  I have recommended admission and patient (and family if present) agree with this plan. Admitting physician will place admission orders.   I reviewed all nursing notes, vitals, pertinent previous records, EKGs, lab and urine results, imaging (as available).     EKG Interpretation  Date/Time:  Saturday June 29 2019 20:55:58 EDT Ventricular Rate:  95 PR Interval:  216 QRS Duration: 70 QT Interval:  336 QTC Calculation: 422 R Axis:   91 Text Interpretation:  Sinus rhythm with 1st degree A-V block Rightward axis Anterior infarct , age undetermined No significant change since last tracing Confirmed by Ward, Cyril Mourning [id number]) on 06/30/2019 12:20:17 AM         Ward, Delice Bison, DO 06/30/19 0110

## 2019-06-30 NOTE — Evaluation (Signed)
Occupational Therapy Evaluation Patient Details Name: Anthony Curry. MRN: [id number] DOB: 11/19/1941 Today's Date: 06/30/2019    History of Present Illness 78 year old male with history of non-small cell lung cancer with metastasis, status post chemotherapy and radiation currently on Opdivo, peripheral neuropathy, chronic hyponatremia recently admitted for weakness and hyponatremia, went home yesterday and then came back to the ER due to persistent dizziness and being off balance.   Clinical Impression   PTA patient independent. Admitted for above and limited by problem list below, including generalized weakness, decreased activity tolerance and dizziness with standing.  Patient able to complete seated ADLs with supervision, min guard assist for standing ADLs, min guard for transfers and in room mobility using RW.  Reviewed safety and importance of positional changes with increased time. Anthony Curry will benefit from continued OT services while admitted and after dc at [facility] level in order to maximize independence and safety with ADLs/mobility.     Follow Up Recommendations  Home health OT    Equipment Recommendations  None recommended by OT    Recommendations for Other Services       Precautions / Restrictions Precautions Precautions: Fall Restrictions Weight Bearing Restrictions: No      Mobility Bed Mobility Overal bed mobility: Modified Independent             General bed mobility comments: OOB upon entry  Transfers Overall transfer level: Needs assistance Equipment used: Rolling walker (2 wheeled) Transfers: Sit to/from Stand Sit to Stand: Min guard Stand pivot transfers: Min guard       General transfer comment: min guard for safety. Pt moves quickly.    Balance Overall balance assessment: Needs assistance Sitting-balance support: Feet supported;No upper extremity supported Sitting balance-Leahy Scale: Good     Standing balance support: Bilateral upper  extremity supported;During functional activity Standing balance-Leahy Scale: Poor Standing balance comment: reliant on BUE support                           ADL either performed or assessed with clinical judgement   ADL Overall ADL's : Needs assistance/impaired     Grooming: Min guard;Standing   Upper Body Bathing: Set up;Sitting   Lower Body Bathing: Min guard;Sit to/from stand   Upper Body Dressing : Set up;Sitting   Lower Body Dressing: Min guard;Sit to/from stand   Toilet Transfer: Min guard;Ambulation;RW;Regular Museum/gallery exhibitions officer and Hygiene: Min guard;Sit to/from stand       Functional mobility during ADLs: Surveyor, minerals     Praxis      Pertinent Vitals/Pain Pain Assessment: No/denies pain     Hand Dominance Right   Extremity/Trunk Assessment Upper Extremity Assessment Upper Extremity Assessment: Generalized weakness   Lower Extremity Assessment Lower Extremity Assessment: Defer to PT evaluation RLE Sensation: history of peripheral neuropathy LLE Sensation: history of peripheral neuropathy       Communication Communication Communication: No difficulties   Cognition Arousal/Alertness: Awake/alert Behavior During Therapy: WFL for tasks assessed/performed Overall Cognitive Status: Within Functional Limits for tasks assessed                                     General Comments  discussed positional changes with orthostatic BPs seen by PT    Exercises  Shoulder Instructions      Home Living Family/patient expects to be discharged to:: Private residence Living Arrangements: Spouse/significant other Available Help at Discharge: Family;Available 24 hours/day Type of Home: Apartment Home Access: Elevator     Home Layout: One level     Bathroom Shower/Tub: Occupational psychologist: Standard     Home Equipment: Environmental consultant - 2 wheels;Cane  - single point;Shower seat - built in          Prior Functioning/Environment Level of Independence: Independent with assistive device(s)        Comments: using 3 wheeled walker for mobility, independent ADLs         OT Problem List: Decreased strength;Impaired balance (sitting and/or standing);Decreased activity tolerance      OT Treatment/Interventions: Self-care/ADL training;Energy conservation;Therapeutic activities;Balance training;Patient/family education    OT Goals(Current goals can be found in the care plan section) Acute Rehab OT Goals Patient Stated Goal: find out why Anthony Curry is dizzy OT Goal Formulation: With patient Time For Goal Achievement: 07/14/19 Potential to Achieve Goals: Good  OT Frequency: Min 2X/week   Barriers to D/C:            Co-evaluation              AM-PAC OT "6 Clicks" Daily Activity     Outcome Measure Help from another person eating meals?: None Help from another person taking care of personal grooming?: A Little Help from another person toileting, which includes using toliet, bedpan, or urinal?: A Little Help from another person bathing (including washing, rinsing, drying)?: A Little Help from another person to put on and taking off regular upper body clothing?: None Help from another person to put on and taking off regular lower body clothing?: A Little 6 Click Score: 20   End of Session Equipment Utilized During Treatment: Rolling walker Nurse Communication: Mobility status  Activity Tolerance: Patient tolerated treatment well Patient left: in chair;with call bell/phone within reach  OT Visit Diagnosis: Unsteadiness on feet (R26.81);Muscle weakness (generalized) (M62.81)                Time: 1194-1740 OT Time Calculation (min): 14 min Charges:  OT General Charges $OT Visit: 1 Visit OT Evaluation $OT Eval Low Complexity: [place], OT Acute Rehabilitation Services Pager [phone] Office  [phone]   Delight Stare 06/30/2019, 2:36 PM

## 2019-06-30 NOTE — ED Notes (Signed)
Attempted report x1. 

## 2019-06-30 NOTE — Progress Notes (Signed)
Patient arrived to the unit at 0620 and ambulated from the stretcher to the bed. He is A&O x4, VSS, and does not complain of pain at this time. Patient is oriented to room and to call bell. Will continue to monitor.

## 2019-06-30 NOTE — Plan of Care (Signed)
  Problem: Education: Goal: Knowledge of General Education information will improve Description Including pain rating scale, medication(s)/side effects and non-pharmacologic comfort measures Outcome: Progressing   Problem: Health Behavior/Discharge Planning: Goal: Ability to manage health-related needs will improve Outcome: Progressing   Problem: Clinical Measurements: Goal: Ability to maintain clinical measurements within normal limits will improve Outcome: Progressing Goal: Will remain free from infection Outcome: Progressing   Problem: Activity: Goal: Risk for activity intolerance will decrease Outcome: Progressing   Problem: Nutrition: Goal: Adequate nutrition will be maintained Outcome: Progressing   Problem: Coping: Goal: Level of anxiety will decrease Outcome: Progressing   Problem: Elimination: Goal: Will not experience complications related to urinary retention Outcome: Progressing   Problem: Pain Managment: Goal: General experience of comfort will improve Outcome: Progressing   Problem: Safety: Goal: Ability to remain free from injury will improve Outcome: Progressing   Problem: Skin Integrity: Goal: Risk for impaired skin integrity will decrease Outcome: Progressing   

## 2019-06-30 NOTE — Progress Notes (Signed)
Patient seen and examined, admitted earlier this morning by Dr. Alcario Drought -This is a 78 year old male with history of non-small cell lung cancer with metastasis, status post chemotherapy and radiation currently on Opdivo, peripheral neuropathy, chronic hyponatremia recently admitted for weakness and hyponatremia, went home yesterday and then came back to the ER due to persistent dizziness and being off balance.  1.  Dizziness and worsening peripheral neuropathy -Longstanding problem for over 2 years, slightly worse in the last few months -Secondary to platinum based chemotherapy, long-term alcoholism (quit drinking 6 years ago) and current immunotherapy Opdivo -Check orthostatics, symptoms not consistent with orthostatic hypotension however he is on Flomax so will check this -Physical therapy evaluation -Severe peripheral neuropathy at baseline, was started on amitriptyline for this 2 years ago however could not tolerate that, will try low-dose gabapentin nightly, home physical therapy -Adequate hydration, but not excessive due to SIADH -ambulate today, anticipate discharge home tomorrow  2.  SIADH/chronic hyponatremia -Baseline sodium close to 128, 130 -Sodium is 127 now, discontinue IV fluids -Continue demeclocycline -His chronic hyponatremia/SIADH is secondary to lung cancer and Opdivo, do not expect considerable improvement in sodium from this, he seems completely asymptomatic as far as this is concerned  3.  Stage IV non-small cell lung cancer -Followed by Dr. Earlie Server, maintained on Opdivo  4.  Paroxysmal atrial fibrillation -Continue Cardizem and Eliquis  Anthony Polite, MD

## 2019-06-30 NOTE — Evaluation (Signed)
Physical Therapy Evaluation Patient Details Name: Anthony Curry. MRN: [id number] DOB: September 05, 1941 Today's Date: 06/30/2019   History of Present Illness  78 year old male with history of non-small cell lung cancer with metastasis, status post chemotherapy and radiation currently on Opdivo, peripheral neuropathy, chronic hyponatremia recently admitted for weakness and hyponatremia, went home yesterday and then came back to the ER due to persistent dizziness and being off balance.    Clinical Impression  Pt admitted with above diagnosis. Pt currently with functional limitations due to the deficits listed below (see PT Problem List). On eval, pt required min guard assist transfers and ambulation 200 feet with RW.  Pt will benefit from skilled PT to increase their independence and safety with mobility to allow discharge to the venue listed below.       Follow Up Recommendations Home health PT;Supervision - Intermittent    Equipment Recommendations  None recommended by PT    Recommendations for Other Services       Precautions / Restrictions Precautions Precautions: Fall      Mobility  Bed Mobility Overal bed mobility: Modified Independent             General bed mobility comments: +rail  Transfers Overall transfer level: Needs assistance Equipment used: Rolling walker (2 wheeled) Transfers: Sit to/from [facility] Sit to Stand: Min guard Stand pivot transfers: Min guard       General transfer comment: min guard for safety. Pt moves quickly.  Ambulation/Gait Ambulation/Gait assistance: Min guard Gait Distance (Feet): 200 Feet Assistive device: Rolling walker (2 wheeled) Gait Pattern/deviations: Step-through pattern;Decreased stride length Gait velocity: WFL Gait velocity interpretation: >2.62 ft/sec, indicative of community ambulatory General Gait Details: Reports dizziness constant during stance. Returning to supine brings relief.  Stairs             Wheelchair Mobility    Modified Rankin (Stroke Patients Only)       Balance Overall balance assessment: Needs assistance Sitting-balance support: Feet supported;No upper extremity supported Sitting balance-Leahy Scale: Good     Standing balance support: Bilateral upper extremity supported;During functional activity Standing balance-Leahy Scale: Poor Standing balance comment: reliant on BUE support                             Pertinent Vitals/Pain Pain Assessment: No/denies pain    Home Living Family/patient expects to be discharged to:: Private residence Living Arrangements: Spouse/significant other Available Help at Discharge: Family;Available 24 hours/day Type of Home: House Home Access: Level entry     Home Layout: Two level;Able to live on main level with bedroom/bathroom Home Equipment: Gilford Rile - 2 wheels;Cane - single point      Prior Function Level of Independence: Independent with assistive device(s)         Comments: Uses 3 wheeled walker for ambulation. Reports no falls. Reports dizziness constantly at home; always has to hold onto something. Household ambulator.     Hand Dominance        Extremity/Trunk Assessment   Upper Extremity Assessment Upper Extremity Assessment: Defer to OT evaluation    Lower Extremity Assessment Lower Extremity Assessment: Generalized weakness RLE Sensation: history of peripheral neuropathy LLE Sensation: history of peripheral neuropathy       Communication   Communication: No difficulties  Cognition Arousal/Alertness: Awake/alert Behavior During Therapy: WFL for tasks assessed/performed Overall Cognitive Status: Within Functional Limits for tasks assessed  General Comments General comments (skin integrity, edema, etc.): Orthostatic BP: supine 183/78, seated 163/79, standing 116/52    Exercises     Assessment/Plan    PT  Assessment Patient needs continued PT services  PT Problem List Decreased strength;Decreased mobility;Decreased safety awareness;Decreased activity tolerance;Decreased cognition;Impaired sensation;Decreased balance;Decreased knowledge of use of DME       PT Treatment Interventions Therapeutic activities;Gait training;Therapeutic exercise;Patient/family education;Balance training;Stair training;Functional mobility training;DME instruction    PT Goals (Current goals can be found in the Care Plan section)  Acute Rehab PT Goals Patient Stated Goal: find out why he is dizzy PT Goal Formulation: With patient Time For Goal Achievement: 07/14/19 Potential to Achieve Goals: Good    Frequency Min 3X/week   Barriers to discharge        Co-evaluation               AM-PAC PT "6 Clicks" Mobility  Outcome Measure Help needed turning from your back to your side while in a flat bed without using bedrails?: None Help needed moving from lying on your back to sitting on the side of a flat bed without using bedrails?: None Help needed moving to and from a bed to a chair (including a wheelchair)?: A Little Help needed standing up from a chair using your arms (e.g., wheelchair or bedside chair)?: A Little Help needed to walk in hospital room?: A Little Help needed climbing 3-5 steps with a railing? : A Little 6 Click Score: 20    End of Session Equipment Utilized During Treatment: Gait belt Activity Tolerance: Patient tolerated treatment well Patient left: with call bell/phone within reach;Other (comment)(in bathroom) Nurse Communication: Mobility status PT Visit Diagnosis: Difficulty in walking, not elsewhere classified (R26.2);Muscle weakness (generalized) (M62.81)    Time: 1111-1130 PT Time Calculation (min) (ACUTE ONLY): 19 min   Charges:   PT Evaluation $PT Eval Low Complexity: 1 Low          Lorrin Goodell, PT  Office # [phone] Pager [phone]   Lorriane Shire 06/30/2019, 1:06 PM

## 2019-06-30 NOTE — H&P (Signed)
History and Physical    Anthony Curry. OZD:[id number] DOB: 07/04/1941 DOA: 06/29/2019  PCP: [name], MD  Patient coming from: Home  I have personally briefly reviewed patient's old medical records in [place]  Chief Complaint: Ataxia  HPI: Anthony Curry. is a 78 y.o. 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  After getting NS initially, he was put on fluid restriction and his sodium at discharge was 129 up from 125 on admission.  After getting home he felt off ballance still and couldn't walk today.  States that he has been feeling very weak and had having a hard time walking because he feels like he is going to fall over every time he gets up.  He states he does not feel lightheaded but more as if he is unsteady.  He denies vertigo.   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  No other new numbness or focal weakness.    No fevers, cough, chest pain, shortness of breath, vomiting, diarrhea.  ED Course: Sodium 127.  Given 500cc NS bolus in ED, MRI brain pending, hospitalist asked to readmit.   Review of Systems: As per HPI, otherwise all review of systems negative.  Past Medical History:  Diagnosis Date  . 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 Adrenal mass ([place])   . Alcoholism in remission ([place])   . Anemia of chronic disease   . 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 Anxiety   . BPH (benign prostatic hyperplasia)   . Cancer ([place])    small cell/lung  . Complication of anesthesia 09/08/2015    JITTERY AFTER GALLBLADDER SURGERY  . GERD (gastroesophageal reflux disease)   . History of radiation therapy 11/14/16, 11/18/16, 11/22/16   Left upper lung: 54 Gy in 3 fractions  . Hypertension   . Hypertension 12/07/2016  . Neoplastic  malignant related fatigue    IV tx. every 2 weeks last9-14-16 (Dr. Earlie Server), radiation last tx. 6 months  . 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 Persistent atrial fibrillation   . Pulmonary nodules   . Radiation 12/02/14-12/16/14   Left adrenal gland 30 Gy in 10 fractions    Past Surgical History:  Procedure Laterality Date  . ANKLE ARTHRODESIS  1995   rt fx-hardware in  . CARDIOVERSION N/A 09/29/2014   Procedure: CARDIOVERSION;  Surgeon: Josue Hector, MD;  Location: [facility] ENDOSCOPY;  Service: Cardiovascular;  Laterality: N/A;  . CHOLECYSTECTOMY  09/08/2015    laproscopic   . CHOLECYSTECTOMY N/A 09/08/2015   Procedure: LAPAROSCOPIC CHOLECYSTECTOMY WITH ATTEMPTED INTRAOPERATIVE CHOLANGIOGRAM ;  Surgeon: Fanny Skates, MD;  Location: [place];  Service: General;  Laterality: N/A;  . COLONOSCOPY    . ERCP N/A 09/02/2015   Procedure: ENDOSCOPIC RETROGRADE CHOLANGIOPANCREATOGRAPHY (ERCP);  Surgeon: Arta Silence, MD;  Location: [facility] ENDOSCOPY;  Service: Endoscopy;  Laterality: N/A;  . ERCP N/A 09/07/2015   Procedure: ENDOSCOPIC RETROGRADE CHOLANGIOPANCREATOGRAPHY (ERCP);  Surgeon: Clarene Essex, MD;  Location: [facility] ENDOSCOPY;  Service: Endoscopy;  Laterality: N/A;  . EXCISION / CURETTAGE BONE CYST FINGER  2005   rt thumb  . HEMORRHOID SURGERY    . Percutaneous Cholecytostomy tube     IVR insertion 06-13-15(Dr. Vernard Gambles)- 08-28-15 remains in place to drainage bag  . TONSILLECTOMY    . TOTAL SHOULDER ARTHROPLASTY  02/03/2012  Procedure: TOTAL SHOULDER ARTHROPLASTY;  Surgeon: [name], MD;  Location: [place];  Service: Orthopedics;  Laterality: Left;     reports that he quit smoking about 28 years ago. His smoking use included cigarettes. He has a 60.00 pack-year smoking history. He has never used smokeless tobacco. He reports current alcohol use. He reports that he does not use drugs.  No Known Allergies  Family History  Problem Relation Age of Onset  . Lung cancer  Mother   . Hypertension Father   . Heart attack Father   . Diabetes Paternal Grandmother   . Brain cancer Brother   . Heart attack Brother   . Neuropathy Neg Hx      Prior to Admission medications   Medication Sig Start Date End Date Taking? Authorizing Provider  ALPRAZolam (XANAX) 0.25 MG tablet TAKE 1 TABLET(0.25 MG) BY MOUTH AT BEDTIME AS NEEDED FOR ANXIETY Patient taking differently: Take 0.25 mg by mouth at bedtime.  05/28/19  Yes [name], MD  apixaban (ELIQUIS) 5 MG TABS tablet Take 5 mg by mouth 2 (two) times daily.   Yes [provider]  Ascorbic Acid (VITAMIN C PO) Take 1,000 mg by mouth daily.    Yes [provider]  B Complex-C (SUPER B COMPLEX PO) Take 1 each by mouth daily.    Yes [provider]  calcium carbonate (OS-CAL) 600 MG TABS Take 600 mg by mouth daily with breakfast.   Yes [provider]  demeclocycline (DECLOMYCIN) 150 MG tablet Take 150-300 mg by mouth See admin instructions. Take 300 mg  In the morning and 150 mg in the evening   Yes [provider]  diltiazem (CARDIZEM CD) 180 MG 24 hr capsule Take 1 capsule (180 mg total) by mouth daily at 12 noon. 01/09/15  Yes Sherran Needs, NP  HYDROcodone-acetaminophen (NORCO/VICODIN) 5-325 MG tablet Take 1 tablet by mouth every 6 (six) hours as needed for moderate pain. 06/13/19  Yes [name], MD  KLOR-CON M20 20 MEQ tablet Take 20 mEq by mouth every morning.  10/18/14  Yes [provider]  Lactobacillus (ACIDOPHILUS) CAPS capsule Take 1 capsule by mouth daily.   Yes [provider]  levothyroxine (SYNTHROID, LEVOTHROID) 50 MCG tablet TAKE 1 TABLET (50 MCG TOTAL) BY MOUTH DAILY BEFORE BREAKFAST. 03/24/18  Yes [name], MD  MAGNESIUM PO Take 400 mg by mouth daily.    Yes [provider]  Multiple Vitamin (MULTIVITAMIN WITH MINERALS) TABS Take 1 tablet by mouth daily.   Yes [provider]  Nivolumab (OPDIVO IV) Inject as  directed every 2 weeks   Yes [provider]  Nutritional Supplements (ENSURE COMPLETE PO) Take 1 Can by mouth 2 (two) times daily.   Yes [provider]  Omega-3 Fatty Acids (FISH OIL PO) Take 1 tablet by mouth daily.   Yes [provider]  sodium chloride 1 g tablet Take 1 tablet (1 g total) by mouth 3 (three) times daily for 5 days. 06/29/19 07/04/19 Yes Mariel Aloe, MD  tamsulosin (FLOMAX) 0.4 MG CAPS capsule Take 0.4 mg by mouth daily at 12 noon.    Yes [provider]  thiamine (VITAMIN B-1) 100 MG tablet Take 1 tablet (100 mg total) by mouth daily. 09/11/13  Yes Erick Colace, NP  vitamin E 400 UNIT capsule Take 400 Units by mouth daily.   Yes [provider]    Physical Exam: Vitals:   06/30/19 0030 06/30/19  0045 06/30/19 0100 06/30/19 0115  BP: (!) 146/98 (!) 155/80 (!) 162/103 (!) 179/85  Pulse: 77   77  Resp: 17 (!) _0 Temp:      SpO2: 99%   98%    Constitutional: NAD, calm, comfortable Eyes: PERRL, lids and conjunctivae normal ENMT: Mucous membranes are moist. Posterior pharynx clear of any exudate or lesions.Normal dentition.  Neck: normal, supple, no masses, no thyromegaly Respiratory: clear to auscultation bilaterally, no wheezing, no crackles. Normal respiratory effort. No accessory muscle use.  Cardiovascular: Regular rate and rhythm, no murmurs / rubs / gallops. No extremity edema. 2+ pedal pulses. No carotid bruits.  Abdomen: no tenderness, no masses palpated. No hepatosplenomegaly. Bowel sounds positive.  Musculoskeletal: no clubbing / cyanosis. No joint deformity upper and lower extremities. Good ROM, no contractures. Normal muscle tone.  Skin: no rashes, lesions, ulcers. No induration Neurologic: Off balance with attempts to ambulate, no nystagmus Psychiatric: Normal judgment and insight. Alert and oriented x 3. Normal mood.    Labs on Admission: I have personally reviewed following labs and imaging studies   CBC: Recent Labs  Lab 06/28/19 0646 06/29/19 0240 06/29/19 2053  WBC 5.2 5.4 7.2  NEUTROABS 3.5  --   --   HGB 13.9 13.3 13.5  HCT 39.3 36.8* 38.6*  MCV 90.3 89.1 91.0  PLT 260 233 700   Basic Metabolic Panel: Recent Labs  Lab 06/28/19 1324 06/28/19 1639 06/28/19 2254 06/29/19 0240 06/29/19 0820 06/29/19 2053  NA 128* 128* 125*  --  129* 127*  K 3.7 3.9 3.9  --  3.9 3.7  CL 96* 97* 96*  --  98 96*  CO2 25 25 21*  --  24 24  GLUCOSE 98 100* 90  --  88 86  BUN 7* 9 6*  --  6* 14  CREATININE 0.65 0.66 0.58*  --  0.63 0.65  CALCIUM 8.9 8.5* 8.5*  --  8.9 8.9  MG  --   --   --  1.7  --   --    GFR: Estimated Creatinine Clearance: 68.7 mL/min (by C-G formula based on SCr of 0.65 mg/dL). Liver Function Tests: Recent Labs  Lab 06/28/19 0646 06/29/19 2053  AST 21 22  ALT 15 17  ALKPHOS 77 105  BILITOT 0.9 0.7  PROT 5.9* 5.7*  ALBUMIN 3.6 3.6   Recent Labs  Lab 06/28/19 0646  LIPASE 29   No results for input(s): AMMONIA in the last 168 hours. Coagulation Profile: Recent Labs  Lab 06/28/19 0646  INR 1.1   Cardiac Enzymes: No results for input(s): CKTOTAL, CKMB, CKMBINDEX, TROPONINI in the last 168 hours. BNP (last 3 results) No results for input(s): PROBNP in the last 8760 hours. HbA1C: No results for input(s): HGBA1C in the last 72 hours. CBG: No results for input(s): GLUCAP in the last 168 hours. Lipid Profile: No results for input(s): CHOL, HDL, LDLCALC, TRIG, CHOLHDL, LDLDIRECT in the last 72 hours. Thyroid Function Tests: No results for input(s): TSH, T4TOTAL, FREET4, T3FREE, THYROIDAB in the last 72 hours. Anemia Panel: No results for input(s): VITAMINB12, FOLATE, FERRITIN, TIBC, IRON, RETICCTPCT in the last 72 hours. Urine analysis:    Component Value Date/Time   COLORURINE YELLOW 06/30/2019 0050   APPEARANCEUR HAZY (A) 06/30/2019 0050   LABSPEC 1.010 06/30/2019 0050   PHURINE 7.0 06/30/2019 0050   GLUCOSEU NEGATIVE 06/30/2019 0050    HGBUR NEGATIVE 06/30/2019 0050   BILIRUBINUR NEGATIVE 06/30/2019 0050   KETONESUR NEGATIVE  06/30/2019 0050   PROTEINUR NEGATIVE 06/30/2019 0050   UROBILINOGEN 0.2 09/10/2015 0940   NITRITE NEGATIVE 06/30/2019 0050   LEUKOCYTESUR NEGATIVE 06/30/2019 0050    Radiological Exams on Admission: Dg Chest Port 1 View  Result Date: 06/28/2019 CLINICAL DATA:  Chest pain EXAM: PORTABLE CHEST 1 VIEW COMPARISON:  05/09/2019 FINDINGS: Normal heart size and stable mediastinal contours. Hyperinflation and mild lung scarring. There is no edema, consolidation, effusion, or pneumothorax. Remote bilateral rib fractures. Remote right humeral neck fracture. Lucency over the lateral right chest wall is attributed to overlapping material. IMPRESSION: No acute finding when compared with priors. COPD. Electronically Signed   By: Monte Fantasia M.D.   On: 06/28/2019 07:15    EKG: Independently reviewed.  Assessment/Plan Principal Problem:   Ataxia Active Problems:   Hyponatremia   Lung cancer (HCC)   Hypertension   SIADH (syndrome of inappropriate ADH production) ([place])    1. Ataxia - 1. DDx includes secondary to hyponatremia (though hyponatremia really quite mild to be causing this), secondary to a CNS cause (ie brain met), or other 2. MRI brain 3. PT/OT 4. See below regarding hyponatremia treatment. 2. Hyponatremia - 1. Treated with fluid restriction last admit for reported SIADH 2. Will go ahead and continue the fluid restriction to 1200 cc/day for now.  Getting just 500cc bolus in ED NS 3. Daily BMP 4. Continue Salt tabs 5. Check urine sodium and urine OSM though to confirm SIADH 3. SCLC - 1. On Opdivo / chemo 4. HTN - 1. Continue home BP meds 5. PAF - 1. Continue eliquis 2. Continue cardizem  DVT prophylaxis: Eliquis Code Status: Full Family Communication: No family in room Disposition Plan: TBD Consults called: None Admission status: Place in 69   [place], [place]  Hospitalists  How to contact the [facility] Attending or Consulting provider [place] or covering provider during after hours [place], for this patient?  1. Check the care team in [facility] and look for a) attending/consulting TRH provider listed and b) the [facility] team listed 2. Log into [email or web address]  Amion Physician Scheduling and messaging for groups and whole hospitals  On call and physician scheduling software for group practices, residents, hospitalists and other medical providers for call, clinic, rotation and shift schedules. OnCall [employer] is a hospital-wide system for scheduling doctors and paging doctors on call. EasyPlot is for scientific plotting and data analysis.  [email or web address]  and use [place]'s universal password to access. If you do not have the password, please contact the hospital operator.  3. Locate the [facility] provider you are looking for under [facility] and page to a number that you can be directly reached. 4. If you still have difficulty reaching the provider, please page the [facility] (Director on Call) for the Hospitalists listed on amion for assistance.  06/30/2019, 1:26 AM

## 2019-07-01 DIAGNOSIS — E871 Hypo-osmolality and hyponatremia: Secondary | ICD-10-CM | POA: Diagnosis not present

## 2019-07-01 DIAGNOSIS — N410 Acute prostatitis: Secondary | ICD-10-CM | POA: Diagnosis not present

## 2019-07-01 DIAGNOSIS — R270 Ataxia, unspecified: Secondary | ICD-10-CM | POA: Diagnosis not present

## 2019-07-01 DIAGNOSIS — R3914 Feeling of incomplete bladder emptying: Secondary | ICD-10-CM | POA: Diagnosis not present

## 2019-07-01 LAB — URINE CULTURE: Culture: NO GROWTH

## 2019-07-01 LAB — BASIC METABOLIC PANEL
Anion gap: 8 (ref 5–15)
BUN: 15 mg/dL (ref 8–23)
CO2: 26 mmol/L (ref 22–32)
Calcium: 8.9 mg/dL (ref 8.9–10.3)
Chloride: 94 mmol/L — ABNORMAL LOW (ref 98–111)
Creatinine, Ser: 0.77 mg/dL (ref 0.61–1.24)
GFR calc Af Amer: >60 mL/min (ref >60)
GFR calc non Af Amer: >60 mL/min (ref >60)
Glucose, Bld: 94 mg/dL (ref 70–99)
Potassium: 4.3 mmol/L (ref 3.5–5.1)
Sodium: 128 mmol/L — ABNORMAL LOW (ref 135–145)

## 2019-07-01 LAB — CBC
HCT: 35.2 % — ABNORMAL LOW (ref 39.0–52.0)
Hemoglobin: 12.5 g/dL — ABNORMAL LOW (ref 13.0–17.0)
MCH: 31.9 pg (ref 26.0–34.0)
MCHC: 35.5 g/dL (ref 30.0–36.0)
MCV: 89.8 fL (ref 80.0–100.0)
Platelets: 239 10*3/uL (ref 150–400)
RBC: 3.92 MIL/uL — ABNORMAL LOW (ref 4.22–5.81)
RDW: 12.5 % (ref 11.5–15.5)
WBC: 4.9 10*3/uL (ref 4.0–10.5)
nRBC: 0 % (ref 0.0–0.2)

## 2019-07-01 MED ORDER — GABAPENTIN 300 MG PO CAPS: 300.0000 mg | ORAL_CAPSULE | Freq: Every day | ORAL | 0 refills | Status: DC

## 2019-07-01 NOTE — Care Management Obs Status (Signed)
[place] NOTIFICATION   Patient Details  Name: Anthony Curry. MRN: [id number] Date of Birth: 02-03-1941   Medicare Observation Status Notification Given:  Yes    Marilu Favre, RN 07/01/2019, 10:50 AM

## 2019-07-01 NOTE — Progress Notes (Signed)
Occupational Therapy Treatment Patient Details Name: Anthony Curry. MRN: [id number] DOB: 01-21-1941 Today's Date: 07/01/2019    History of present illness 78 year old male with history of non-small cell lung cancer with metastasis, status post chemotherapy and radiation currently on Opdivo, peripheral neuropathy, chronic hyponatremia recently admitted for weakness and hyponatremia, went home yesterday and then came back to the ER due to persistent dizziness and being off balance.   OT comments  Pt is at adequate level to d/c home with wife today.pt demonstrates supervision level and continues to be fall risk due to impulsive movements. Pt able to verbalize awareness but unable to demonstrate without min cues. Pt very fixated on seeing oncology doctor to discuss all the medications and which medications remain appropriate. Rn notified patient would like the purpose of each medication provided on discharge sheets to help him follow up with his doctor.   Follow Up Recommendations  Home health OT    Equipment Recommendations       Recommendations for Other Services      Precautions / Restrictions Precautions Precautions: Fall Restrictions Weight Bearing Restrictions: No       Mobility Bed Mobility Overal bed mobility: Modified Independent                Transfers Overall transfer level: Needs assistance Equipment used: Rolling walker (2 wheeled) Transfers: Sit to/from Stand Sit to Stand: Supervision         General transfer comment: transfers to chair this session to await RN to deliver d/c papers    Balance     Sitting balance-Leahy Scale: Good       Standing balance-Leahy Scale: Fair Standing balance comment: LOB without UE support but able to correct by regaining UE support with RW.                           ADL either performed or assessed with clinical judgement   ADL Overall ADL's : Needs assistance/impaired Eating/Feeding:  Independent   Grooming: Supervision/safety           Upper Body Dressing : Supervision/safety   Lower Body Dressing: Supervision/safety   Toilet Transfer: Supervision/safety           Functional mobility during ADLs: Supervision/safety General ADL Comments: pt given cues to static stand and count out louds prior to moving to use this a tool to decr speed in movement. pt states "if i would have used my RW months ago like i was suppose to i wouldnt have broken my wrist. Its my own fault when i dont stop and think"     Vision       Perception     Praxis      Cognition Arousal/Alertness: Awake/alert Behavior During Therapy: Flat affect Overall Cognitive Status: Within Functional Limits for tasks assessed                                 General Comments: pt with impulsive movement and self reports prior to demonstrating. pt expressed frustration and anger at this time due to sequence of events taht have lead to a second hospitalization. pt states "i am tired of being sick"        Exercises General Exercises - Lower Extremity Ankle Circles/Pumps: AROM;10 reps;Both;Supine Quad Sets: AROM;10 reps;Both;Supine Heel Slides: AROM;10 reps;Supine;Both Straight Leg Raises: AROM;10 reps;Both;Supine   Shoulder Instructions  General Comments      Pertinent Vitals/ Pain       Pain Assessment: No/denies pain  Home Living                                          Prior Functioning/Environment              Frequency  Min 2X/week        Progress Toward Goals  OT Goals(current goals can now be found in the care plan section)  Progress towards OT goals: Progressing toward goals  Acute Rehab OT Goals Patient Stated Goal: To go home OT Goal Formulation: With patient Time For Goal Achievement: 07/14/19 Potential to Achieve Goals: Good ADL Goals Pt Will Perform Grooming: with modified independence;standing Pt Will Transfer to  Toilet: with modified independence;ambulating Pt Will Perform Toileting - Clothing Manipulation and hygiene: with modified independence;sit to/from stand Pt Will Perform Tub/Shower Transfer: Shower transfer;shower seat;ambulating;with modified independence Additional ADL Goal #1: Patient will demonstrate use of 2 energy conservation and fall prevention techniques during ADL routine (including item retrieval) with modified independence.  Plan Discharge plan remains appropriate    Co-evaluation                 AM-PAC OT "6 Clicks" Daily Activity     Outcome Measure   Help from another person eating meals?: None Help from another person taking care of personal grooming?: A Little Help from another person toileting, which includes using toliet, bedpan, or urinal?: A Little Help from another person bathing (including washing, rinsing, drying)?: A Little Help from another person to put on and taking off regular upper body clothing?: None Help from another person to put on and taking off regular lower body clothing?: A Little 6 Click Score: 20    End of Session Equipment Utilized During Treatment: Rolling walker  OT Visit Diagnosis: Unsteadiness on feet (R26.81);Muscle weakness (generalized) (M62.81)   Activity Tolerance Patient tolerated treatment well   Patient Left in chair;with call bell/phone within reach   Nurse Communication Mobility status        Time: 1423-9532 OT Time Calculation (min): 22 min  Charges: OT General Charges $OT Visit: 1 Visit OT Treatments $Self Care/Home Management : 8-22 mins   Jeri Modena, OTR/L  Acute Rehabilitation Services Pager: [phone] Office: [phone] .    Jeri Modena 07/01/2019, 11:41 AM

## 2019-07-01 NOTE — TOC Initial Note (Signed)
Transition of Care (TOC) - Initial/Assessment Note    Patient Details  Name: Anthony Curry. MRN: [id number] Date of Birth: 30-Nov-1941  Transition of Care [facility]) CM/SW Contact:    Marilu Favre, RN Phone Number: 07/01/2019, 10:53 AM  Clinical Narrative:                   Expected Discharge Plan: [place] Barriers to Discharge: No Barriers Identified   Patient Goals and CMS Choice Patient states their goals for this hospitalization and ongoing recovery are:: to go hom e CMS Medicare.gov Compare Post Acute Care list provided to:: Patient Choice offered to / list presented to : Patient  Expected Discharge Plan and Services Expected Discharge Plan: [place]   Discharge Planning Services: CM Consult Post Acute Care Choice: [place] arrangements for the past 2 months: Single Family Home Expected Discharge Date: 07/01/19               DME Arranged: N/A         HH Arranged: PT, OT HH Agency: [facility] Date HH Agency Contacted: 07/01/19 Time HH Agency Contacted: 92 Representative spoke with at [place]: Sharmon Revere  Prior Living Arrangements/Services Living arrangements for the past 2 months: [place]   Patient language and need for interpreter reviewed:: Yes Do you feel safe going back to the place where you live?: Yes      Need for Family Participation in Patient Care: Yes (Comment) Care giver support system in place?: Yes (comment)   Criminal Activity/Legal Involvement Pertinent to Current Situation/Hospitalization: No - Comment as needed  Activities of Daily Living      Permission Sought/Granted   Permission granted to share information with : Yes, Verbal Permission Granted     Permission granted to share info w AGENCY: [facility]        Emotional Assessment   Attitude/Demeanor/Rapport: Engaged Affect (typically observed): Accepting Orientation: : Oriented to Self,  Oriented to Place, Oriented to  Time, Oriented to Situation Alcohol / Substance Use: Not Applicable Psych Involvement: No (comment)  Admission diagnosis:  Ataxia [R27.0] Hyponatremia [E87.1] Patient Active Problem List   Diagnosis Date Noted  . Ataxia 06/30/2019  . SIADH (syndrome of inappropriate ADH production) ([place]) 06/30/2019  . Generalized weakness 06/28/2019  . Lactic acidosis 06/28/2019  . Abnormal urinalysis 06/28/2019  . Hypertension 12/07/2016  . Chemotherapy-induced neuropathy ([place]) 10/15/2015  . Acute urinary retention 09/09/2015  . Choledocholithiasis with cholecystitis 09/08/2015  . Common bile duct stone   . Encounter for antineoplastic immunotherapy 06/28/2015  . Atrial fibrillation ([place]) 06/11/2015  . Gynecomastia, male 06/10/2015  . Dizziness 05/11/2015  . Malignant neoplasm of lower lobe of right lung ([place]) 09/17/2014  . Neutropenic fever ([place]) 09/02/2014  . Neoplastic malignant related fatigue 08/13/2014  . Physical deconditioning 08/13/2014  . Hypoalbuminemia 08/13/2014  . Diarrhea 08/13/2014  . Antineoplastic chemotherapy induced anemia(285.3) 08/13/2014  . Atrial fibrillation with RVR ([place]) 07/28/2014  . HCAP (healthcare-associated pneumonia) 07/25/2014  . Alcoholism in remission ([place]) 07/25/2014  . Atrial flutter by electrocardiogram ([place]) 07/25/2014  . Swelling of limb 06/10/2014  . Lung cancer ([place]) 10/15/2013  . Hypotension 09/10/2013  . Adrenal mass ([place]) 09/09/2013  . 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 Pulmonary nodules 08/19/2013  . Anemia of chronic disease 07/03/2013  . BPH (benign prostatic hyperplasia) 07/03/2013  . GERD (gastroesophageal reflux disease) 07/03/2013  . Fall 07/03/2013  . Alcohol intoxication ([place])  07/03/2013  . 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 Alcohol abuse 10/15/2011  . Hyponatremia 10/15/2011   PCP:  [name], MD Pharmacy:   [id number] [place], [place] [address] [place] [address] Phone: [phone] Fax: [phone]     Social Determinants of Health (SDOH) Interventions    Readmission Risk Interventions No flowsheet data found.

## 2019-07-01 NOTE — Progress Notes (Signed)
Physical Therapy Treatment Patient Details Name: Anthony Curry. MRN: [id number] DOB: [birth year] Today's Date: 07/01/2019    History of Present Illness 78 year old male with history of non-small cell lung cancer with metastasis, status post chemotherapy and radiation currently on Opdivo, peripheral neuropathy, chronic hyponatremia recently admitted for weakness and hyponatremia, went home yesterday and then came back to the ER due to persistent dizziness and being off balance.    PT Comments    Pt performed gt training and functional mobility during session this am.  He continues to present with minor safety concerns due to impulsivity.  He moves quickly and requires reminders to improve safety.  Minor LOB during standing when removing hands from RW, he was able to self correct.  Pt issued LE strengthening exercises and educated on frequency.  He is ready to d/c home from a mobility stand point and follow up with HHPT at d/c.     Follow Up Recommendations  Home health PT;Supervision - Intermittent     Equipment Recommendations  None recommended by PT    Recommendations for Other Services       Precautions / Restrictions Precautions Precautions: Fall Restrictions Weight Bearing Restrictions: No    Mobility  Bed Mobility Overal bed mobility: Modified Independent                Transfers Overall transfer level: Needs assistance Equipment used: Rolling walker (2 wheeled) Transfers: Sit to/from Stand Sit to Stand: Supervision         General transfer comment: Cues for hand placement to and from seated surface.  Pt continues to move quickly and impulsively.  Ambulation/Gait Ambulation/Gait assistance: Min guard Gait Distance (Feet): 310 Feet Assistive device: Rolling walker (2 wheeled) Gait Pattern/deviations: Step-through pattern;Decreased stride length     General Gait Details: Pt with shuffling pattern in loose fitting bedroom shoes, minor LOB but able to  self correct with taking hands off of RW during gt, reports dizziness worsened as gt progressed,   Stairs             Wheelchair Mobility    Modified Rankin (Stroke Patients Only)       Balance     Sitting balance-Leahy Scale: Good       Standing balance-Leahy Scale: Fair Standing balance comment: LOB without UE support but able to correct by regaining UE support with RW.                            Cognition Arousal/Alertness: Awake/alert Behavior During Therapy: Flat affect Overall Cognitive Status: Within Functional Limits for tasks assessed                                 General Comments: Remains impulsive with poor safety awareness.      Exercises General Exercises - Lower Extremity Ankle Circles/Pumps: AROM;10 reps;Both;Supine Quad Sets: AROM;10 reps;Both;Supine Heel Slides: AROM;10 reps;Supine;Both Straight Leg Raises: AROM;10 reps;Both;Supine    General Comments        Pertinent Vitals/Pain Pain Assessment: No/denies pain    Home Living                      Prior Function            PT Goals (current goals can now be found in the care plan section) Acute Rehab PT Goals Patient Stated Goal: To go home  Potential to Achieve Goals: Good Additional Goals Additional Goal #1: Pt will score >19/24 on DGI to indicate decreased fall risk.    Frequency    Min 3X/week      PT Plan Current plan remains appropriate    Co-evaluation              AM-PAC PT "6 Clicks" Mobility   Outcome Measure  Help needed turning from your back to your side while in a flat bed without using bedrails?: None Help needed moving from lying on your back to sitting on the side of a flat bed without using bedrails?: None Help needed moving to and from a bed to a chair (including a wheelchair)?: A Little Help needed standing up from a chair using your arms (e.g., wheelchair or bedside chair)?: A Little Help needed to walk in  hospital room?: A Little Help needed climbing 3-5 steps with a railing? : A Little 6 Click Score: 20    End of Session Equipment Utilized During Treatment: Gait belt Activity Tolerance: Patient tolerated treatment well Patient left: with call bell/phone within reach;Other (comment) Nurse Communication: Mobility status PT Visit Diagnosis: Difficulty in walking, not elsewhere classified (R26.2);Muscle weakness (generalized) (M62.81)     Time: 6226-3335 PT Time Calculation (min) (ACUTE ONLY): 19 min  Charges:  $Gait Training: 8-22 mins                     Governor Rooks, PTA Acute Rehabilitation Services Pager [phone] Office [phone]     [place] Floor Eli Hose 07/01/2019, 10:37 AM

## 2019-07-01 NOTE — Progress Notes (Signed)
Discharged Pt to home. Instructions given and explained. Belongings return accordingly.

## 2019-07-03 DIAGNOSIS — G629 Polyneuropathy, unspecified: Secondary | ICD-10-CM | POA: Diagnosis not present

## 2019-07-03 DIAGNOSIS — F419 Anxiety disorder, unspecified: Secondary | ICD-10-CM | POA: Diagnosis not present

## 2019-07-03 DIAGNOSIS — G4701 Insomnia due to medical condition: Secondary | ICD-10-CM | POA: Diagnosis not present

## 2019-07-03 DIAGNOSIS — Z09 Encounter for follow-up examination after completed treatment for conditions other than malignant neoplasm: Secondary | ICD-10-CM | POA: Diagnosis not present

## 2019-07-03 DIAGNOSIS — E871 Hypo-osmolality and hyponatremia: Secondary | ICD-10-CM | POA: Diagnosis not present

## 2019-07-03 NOTE — Discharge Summary (Signed)
Physician Discharge Summary  Anthony Curry. OIZ:[id number] DOB: August 19, 1941 DOA: 06/29/2019  PCP: Anthony Dus, MD  Admit date: 06/29/2019 Discharge date: 07/03/2019  Time spent: 35 minutes  Recommendations for Outpatient Follow-up:  1. Dr. Earlie Curry in 2 weeks 2. PCP Dr. Mariea Curry in 1 week 3. 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  Hospital Course:   1.  Dizziness and worsening peripheral neuropathy -Longstanding problem for over 2 years, slightly worse in the last few months -Secondary to platinum based chemotherapy, long-term alcoholism (quit drinking 6 years ago) and current immunotherapy Opdivo (dizziness and neuropathy are listed as common side effects of this medication) -Orthostatics were negative, no events on telemetry, physical therapy evaluation completed home health PT  -Severe peripheral neuropathy at baseline, was started on amitriptyline for this 2 years ago however could not tolerate that, started on low-dose low-dose gabapentin nightly, home physical therapy set up at discharge -Discharged home in a stable condition, follow-up with PCP in 1 week  2.  SIADH/chronic hyponatremia -Baseline sodium close to 128, 130 -Sodium on admission was 127 which is close to his baseline, continued on demeclocycline -His chronic  hyponatremia/SIADH is secondary to lung cancer and Opdivo, do not expect considerable improvement in sodium from this, he seems completely asymptomatic as far as this is concerned  3.  Stage IV non-small cell lung cancer -Followed by Dr. Earlie Curry, maintained on Opdivo  4.  Paroxysmal atrial fibrillation -Continue Cardizem and Eliquis    Discharge Exam: Vitals:   06/30/19 2018 07/01/19 0506  BP: 133/63 (!) 113/53  Pulse: 76 67  Resp: 16 17  Temp: 98 F (36.7 C) 98.2 F (36.8 C)  SpO2: 98% 95%    General: AAO x3 Cardiovascular: S1-S2/regular rate rhythm Respiratory: Clear bilaterally  Discharge Instructions   Discharge Instructions    Diet - low sodium heart healthy   Complete by: As directed    Increase activity slowly   Complete by: As directed      Allergies as of 07/01/2019   No Known Allergies     Medication List    STOP taking these medications   sodium chloride 1 g tablet     TAKE these medications   Acidophilus Caps capsule Take 1 capsule by mouth daily.   ALPRAZolam 0.25 MG tablet Commonly known as: XANAX TAKE 1 TABLET(0.25 MG) BY MOUTH AT BEDTIME AS NEEDED FOR ANXIETY What changed: See the new instructions.   calcium carbonate 600 MG Tabs tablet Commonly known as: OS-CAL Take 600 mg by mouth daily with breakfast.   demeclocycline 150 MG tablet Commonly known as: DECLOMYCIN Take 150-300 mg by mouth See admin instructions. Take 300 mg  In the morning and 150 mg in the evening   diltiazem 180 MG 24 hr capsule Commonly known as: CARDIZEM CD Take 1 capsule (180 mg total) by mouth daily at 12 noon.   Eliquis 5 MG Tabs tablet Generic drug: apixaban Take  5 mg by mouth 2 (two) times daily.   ENSURE COMPLETE PO Take 1 Can by mouth 2 (two) times daily.   FISH OIL PO Take 1 tablet by mouth daily.   gabapentin 300 MG capsule Commonly known as: NEURONTIN Take 1 capsule (300 mg total) by mouth at bedtime.   HYDROcodone-acetaminophen 5-325 MG  tablet Commonly known as: NORCO/VICODIN Take 1 tablet by mouth every 6 (six) hours as needed for moderate pain.   Klor-Con M20 20 MEQ tablet Generic drug: potassium chloride SA Take 20 mEq by mouth every morning.   levothyroxine 50 MCG tablet Commonly known as: SYNTHROID TAKE 1 TABLET (50 MCG TOTAL) BY MOUTH DAILY BEFORE BREAKFAST.   MAGNESIUM PO Take 400 mg by mouth daily.   multivitamin with minerals Tabs tablet Take 1 tablet by mouth daily.   OPDIVO IV Inject as directed every 2 weeks   SUPER B COMPLEX PO Take 1 each by mouth daily.   tamsulosin 0.4 MG Caps capsule Commonly known as: FLOMAX Take 0.4 mg by mouth daily at 12 noon.   thiamine 100 MG tablet Commonly known as: VITAMIN B-1 Take 1 tablet (100 mg total) by mouth daily.   VITAMIN C PO Take 1,000 mg by mouth daily.   vitamin E 400 UNIT capsule Take 400 Units by mouth daily.      No Known Allergies Follow-up Information    Anthony Dus, MD. Schedule an appointment as soon as possible for a visit in 1 week(s).   Specialty: Family Medicine Contact information: [place] [address] [phone]        Anthony Bears, MD Follow up on 07/09/2019.   Specialty: Oncology Contact information: [place] [address] [phone]        [facility] Follow up.   Contact information: [place] [address] [phone]            The results of significant diagnostics from this hospitalization (including imaging, microbiology, ancillary and laboratory) are listed below for reference.    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 Ataxia. EXAM: MRI HEAD WITHOUT AND WITH CONTRAST TECHNIQUE: Multiplanar, multiecho pulse sequences of the brain and  surrounding structures were obtained without and with intravenous contrast. CONTRAST:  6 mL Gadavist COMPARISON:  Brain MRI 07/06/2016 FINDINGS: BRAIN: There is no acute infarct, acute hemorrhage or extra-axial collection. Early confluent hyperintense T2-weighted signal of the periventricular and deep white matter, most commonly due to chronic ischemic microangiopathy. There is generalized atrophy without lobar predilection. The midline structures are normal. No abnormal contrast enhancement. VASCULAR: Susceptibility-sensitive sequences show no chronic microhemorrhage or superficial siderosis. The major intracranial arterial and venous sinus flow voids are normal. SKULL AND UPPER CERVICAL SPINE: Hypertrophy at the atlantoaxial joint. SINUSES/ORBITS: There are no fluid levels or advanced mucosal thickening. The mastoid air cells and middle ear cavities are free of fluid. The orbits are normal. IMPRESSION: Chronic white matter disease without acute intracranial abnormality. No contrast enhancing intracranial lesions. Electronically Signed   By: Ulyses Jarred M.D.   On: 06/30/2019 05:37   Dg Chest Port 1 View  Result Date: 06/28/2019 CLINICAL DATA:  Chest pain EXAM: PORTABLE CHEST 1 VIEW COMPARISON:  05/09/2019 FINDINGS: Normal heart size and stable mediastinal contours. Hyperinflation and mild lung scarring. There is no edema, consolidation,  effusion, or pneumothorax. Remote bilateral rib fractures. Remote right humeral neck fracture. Lucency over the lateral right chest wall is attributed to overlapping material. IMPRESSION: No acute finding when compared with priors. COPD. Electronically Signed   By: Monte Fantasia M.D.   On: 06/28/2019 07:15    Microbiology: Recent Results (from the past 240 hour(s))  Urine culture     Status: None   Collection Time: 06/28/19  7:02 AM   Specimen: Urine, Random  Result Value Ref Range Status   Specimen Description URINE, RANDOM  Final   Special Requests NONE  Final    Culture   Final    NO GROWTH Performed at [facility], [address] [place], [place] [address]    Report Status 06/29/2019 FINAL  Final  SARS Coronavirus 2 (CEPHEID - Performed in [facility] hospital lab), Hosp Order     Status: None   Collection Time: 06/28/19 11:35 AM   Specimen: Nasopharyngeal Swab  Result Value Ref Range Status   SARS Coronavirus 2 NEGATIVE NEGATIVE Final    Comment: (NOTE) If result is NEGATIVE SARS-CoV-2 target nucleic acids are NOT DETECTED. The SARS-CoV-2 RNA is generally detectable in upper and lower  respiratory specimens during the acute phase of infection. The lowest  concentration of SARS-CoV-2 viral copies this assay can detect is 250  copies / mL. A negative result does not preclude SARS-CoV-2 infection  and should not be used as the sole basis for treatment or other  patient management decisions.  A negative result may occur with  improper specimen collection / handling, submission of specimen other  than nasopharyngeal swab, presence of viral mutation(s) within the  areas targeted by this assay, and inadequate number of viral copies  (<250 copies / mL). A negative result must be combined with clinical  observations, patient history, and epidemiological information. If result is POSITIVE SARS-CoV-2 target nucleic acids are DETECTED. The SARS-CoV-2 RNA is generally detectable in upper and lower  respiratory specimens dur ing the acute phase of infection.  Positive  results are indicative of active infection with SARS-CoV-2.  Clinical  correlation with patient history and other diagnostic information is  necessary to determine patient infection status.  Positive results do  not rule out bacterial infection or co-infection with other viruses. If result is PRESUMPTIVE POSTIVE SARS-CoV-2 nucleic acids MAY BE PRESENT.   A presumptive positive result was obtained on the submitted specimen  and confirmed on repeat testing.  While 2019 novel  coronavirus  (SARS-CoV-2) nucleic acids may be present in the submitted sample  additional confirmatory testing may be necessary for epidemiological  and / or clinical management purposes  to differentiate between  SARS-CoV-2 and other Sarbecovirus currently known to infect humans.  If clinically indicated additional testing with an alternate test  methodology [phone]) is advised. The SARS-CoV-2 RNA is generally  detectable in upper and lower respiratory sp ecimens during the acute  phase of infection. The expected result is Negative. Fact Sheet for Patients:  StrictlyIdeas.no Fact Sheet for Healthcare Providers: BankingDealers.co.za This test is not yet approved or cleared by the [place] FDA and has been authorized for detection and/or diagnosis of SARS-CoV-2 by FDA under an Emergency Use Authorization (EUA).  This EUA will remain in effect (meaning this test can be used) for the duration of the COVID-19 declaration under Section 564(b)(1) of the Act, 21 U.S.C. section 360bbb-3(b)(1), unless the authorization is terminated or revoked sooner. Performed at [facility], [place]  [address] [place], [place] [address]   Urine culture     Status: None   Collection Time: 06/30/19 12:21 AM   Specimen: Urine, Random  Result Value Ref Range Status   Specimen Description URINE, RANDOM  Final   Special Requests NONE  Final   Culture   Final    NO GROWTH Performed at [facility], [place] [address], [place], [place] [address]    Report Status 07/01/2019 FINAL  Final     Labs: Basic Metabolic Panel: Recent Labs  Lab 06/28/19 2254 06/29/19 0240 06/29/19 0820 06/29/19 2053 06/30/19 0836 07/01/19 0314  NA 125*  --  129* 127* 127* 128*  K 3.9  --  3.9 3.7 3.8 4.3  CL 96*  --  98 96* 95* 94*  CO2 21*  --  24 24 24 26   GLUCOSE 90  --  88 86 107* 94  BUN 6*  --  6* 14 9 15   CREATININE 0.58*  --  0.63 0.65 0.65 0.77  CALCIUM  8.5*  --  8.9 8.9 8.7* 8.9  MG  --  1.7  --   --   --   --    Liver Function Tests: Recent Labs  Lab 06/28/19 0646 06/29/19 2053  AST 21 22  ALT 15 17  ALKPHOS 77 105  BILITOT 0.9 0.7  PROT 5.9* 5.7*  ALBUMIN 3.6 3.6   Recent Labs  Lab 06/28/19 0646  LIPASE 29   No results for input(s): AMMONIA in the last 168 hours. CBC: Recent Labs  Lab 06/28/19 0646 06/29/19 0240 06/29/19 2053 07/01/19 0314  WBC 5.2 5.4 7.2 4.9  NEUTROABS 3.5  --   --   --   HGB 13.9 13.3 13.5 12.5*  HCT 39.3 36.8* 38.6* 35.2*  MCV 90.3 89.1 91.0 89.8  PLT 260 233 222 239   Cardiac Enzymes: No results for input(s): CKTOTAL, CKMB, CKMBINDEX, TROPONINI in the last 168 hours. BNP: BNP (last 3 results) Recent Labs    06/28/19 0646  BNP 67.4    ProBNP (last 3 results) No results for input(s): PROBNP in the last 8760 hours.  CBG: No results for input(s): GLUCAP in the last 168 hours.     Signed:  Domenic Polite MD.  [facility] 07/03/2019, 2:52 PM

## 2019-07-04 DIAGNOSIS — D630 Anemia in neoplastic disease: Secondary | ICD-10-CM | POA: Diagnosis not present

## 2019-07-04 DIAGNOSIS — C3431 Malignant neoplasm of lower lobe, right bronchus or lung: Secondary | ICD-10-CM | POA: Diagnosis not present

## 2019-07-04 DIAGNOSIS — G629 Polyneuropathy, unspecified: Secondary | ICD-10-CM | POA: Diagnosis not present

## 2019-07-04 DIAGNOSIS — K219 Gastro-esophageal reflux disease without esophagitis: Secondary | ICD-10-CM | POA: Diagnosis not present

## 2019-07-04 DIAGNOSIS — F419 Anxiety disorder, unspecified: Secondary | ICD-10-CM | POA: Diagnosis not present

## 2019-07-04 DIAGNOSIS — C7970 Secondary malignant neoplasm of unspecified adrenal gland: Secondary | ICD-10-CM | POA: Diagnosis not present

## 2019-07-04 DIAGNOSIS — M19012 Primary osteoarthritis, left shoulder: Secondary | ICD-10-CM | POA: Diagnosis not present

## 2019-07-04 DIAGNOSIS — I4819 Other persistent atrial fibrillation: Secondary | ICD-10-CM | POA: Diagnosis not present

## 2019-07-04 DIAGNOSIS — N400 Enlarged prostate without lower urinary tract symptoms: Secondary | ICD-10-CM | POA: Diagnosis not present

## 2019-07-04 DIAGNOSIS — G4700 Insomnia, unspecified: Secondary | ICD-10-CM | POA: Diagnosis not present

## 2019-07-04 DIAGNOSIS — F1011 Alcohol abuse, in remission: Secondary | ICD-10-CM | POA: Diagnosis not present

## 2019-07-04 DIAGNOSIS — I10 Essential (primary) hypertension: Secondary | ICD-10-CM | POA: Diagnosis not present

## 2019-07-09 ENCOUNTER — Telehealth: Payer: Self-pay | Admitting: Physician Assistant

## 2019-07-09 ENCOUNTER — Inpatient Hospital Stay: Payer: Medicare Other | Attending: Internal Medicine

## 2019-07-09 ENCOUNTER — Inpatient Hospital Stay (HOSPITAL_BASED_OUTPATIENT_CLINIC_OR_DEPARTMENT_OTHER): Payer: Medicare Other | Admitting: Physician Assistant

## 2019-07-09 ENCOUNTER — Other Ambulatory Visit: Payer: Self-pay

## 2019-07-09 ENCOUNTER — Encounter: Payer: Self-pay | Admitting: Physician Assistant

## 2019-07-09 ENCOUNTER — Inpatient Hospital Stay: Payer: Medicare Other

## 2019-07-09 VITALS — BP 153/88 | HR 80 | Temp 98.5°F | Resp 18 | Ht 71.0 in | Wt 140.7 lb

## 2019-07-09 DIAGNOSIS — C3490 Malignant neoplasm of unspecified part of unspecified bronchus or lung: Secondary | ICD-10-CM

## 2019-07-09 DIAGNOSIS — C3431 Malignant neoplasm of lower lobe, right bronchus or lung: Secondary | ICD-10-CM

## 2019-07-09 DIAGNOSIS — E871 Hypo-osmolality and hyponatremia: Secondary | ICD-10-CM

## 2019-07-09 DIAGNOSIS — R5382 Chronic fatigue, unspecified: Secondary | ICD-10-CM

## 2019-07-09 DIAGNOSIS — G893 Neoplasm related pain (acute) (chronic): Secondary | ICD-10-CM

## 2019-07-09 DIAGNOSIS — Z5112 Encounter for antineoplastic immunotherapy: Secondary | ICD-10-CM

## 2019-07-09 DIAGNOSIS — Z79899 Other long term (current) drug therapy: Secondary | ICD-10-CM | POA: Insufficient documentation

## 2019-07-09 LAB — CBC WITH DIFFERENTIAL (CANCER CENTER ONLY)
Abs Immature Granulocytes: 0.01 10*3/uL (ref 0.00–0.07)
Basophils Absolute: 0 10*3/uL (ref 0.0–0.1)
Basophils Relative: 0 %
Eosinophils Absolute: 0.2 10*3/uL (ref 0.0–0.5)
Eosinophils Relative: 3 %
HCT: 40.2 % (ref 39.0–52.0)
Hemoglobin: 13.8 g/dL (ref 13.0–17.0)
Immature Granulocytes: 0 %
Lymphocytes Relative: 19 %
Lymphs Abs: 0.9 10*3/uL (ref 0.7–4.0)
MCH: 31.5 pg (ref 26.0–34.0)
MCHC: 34.3 g/dL (ref 30.0–36.0)
MCV: 91.8 fL (ref 80.0–100.0)
Monocytes Absolute: 0.5 10*3/uL (ref 0.1–1.0)
Monocytes Relative: 10 %
Neutro Abs: 3.3 10*3/uL (ref 1.7–7.7)
Neutrophils Relative %: 68 %
Platelet Count: 265 10*3/uL (ref 150–400)
RBC: 4.38 MIL/uL (ref 4.22–5.81)
RDW: 13 % (ref 11.5–15.5)
WBC Count: 4.9 10*3/uL (ref 4.0–10.5)
nRBC: 0 % (ref 0.0–0.2)

## 2019-07-09 LAB — CMP (CANCER CENTER ONLY)
ALT: 14 U/L (ref 0–44)
AST: 16 U/L (ref 15–41)
Albumin: 3.9 g/dL (ref 3.5–5.0)
Alkaline Phosphatase: 101 U/L (ref 38–126)
Anion gap: 9 (ref 5–15)
BUN: 21 mg/dL (ref 8–23)
CO2: 26 mmol/L (ref 22–32)
Calcium: 9.5 mg/dL (ref 8.9–10.3)
Chloride: 98 mmol/L (ref 98–111)
Creatinine: 0.77 mg/dL (ref 0.61–1.24)
GFR, Est AFR Am: >60 mL/min (ref >60)
GFR, Estimated: >60 mL/min (ref >60)
Glucose, Bld: 93 mg/dL (ref 70–99)
Potassium: 4.4 mmol/L (ref 3.5–5.1)
Sodium: 133 mmol/L — ABNORMAL LOW (ref 135–145)
Total Bilirubin: 0.7 mg/dL (ref 0.3–1.2)
Total Protein: 6.3 g/dL — ABNORMAL LOW (ref 6.5–8.1)

## 2019-07-09 LAB — TSH: TSH: 2.642 u[IU]/mL (ref 0.320–4.118)

## 2019-07-09 MED ORDER — HEPARIN SOD (PORK) LOCK FLUSH 100 UNIT/ML IV SOLN
500.0000 [IU] | Freq: Once | INTRAVENOUS | Status: DC | PRN
  Filled 2019-07-09: qty 5

## 2019-07-09 MED ORDER — SODIUM CHLORIDE 0.9 % IV SOLN
Freq: Once | INTRAVENOUS | Status: AC
  Administered 2019-07-09: 13:00:00 via INTRAVENOUS
  Filled 2019-07-09: qty 250

## 2019-07-09 MED ORDER — HYDROCODONE-ACETAMINOPHEN 5-325 MG PO TABS: 1.0000 | ORAL_TABLET | Freq: Four times a day (QID) | ORAL | 0 refills | Status: DC | PRN

## 2019-07-09 MED ORDER — SODIUM CHLORIDE 0.9 % IV SOLN
480.0000 mg | Freq: Once | INTRAVENOUS | Status: AC
  Administered 2019-07-09: 480 mg via INTRAVENOUS
  Filled 2019-07-09: qty 48

## 2019-07-09 MED ORDER — SODIUM CHLORIDE 0.9% FLUSH
10.0000 mL | INTRAVENOUS | Status: DC | PRN
  Filled 2019-07-09: qty 10

## 2019-07-09 MED ORDER — DEMECLOCYCLINE HCL 150 MG PO TABS: ORAL_TABLET | ORAL | 2 refills | Status: DC

## 2019-07-09 MED ORDER — ALPRAZOLAM 0.25 MG PO TABS: ORAL_TABLET | ORAL | 0 refills | Status: DC

## 2019-07-09 NOTE — Telephone Encounter (Signed)
No 8/04 LOS

## 2019-07-09 NOTE — Progress Notes (Signed)
[facility] OFFICE PROGRESS NOTE  [name], [place] [address]  DIAGNOSIS: Stage IV (T1a, N0, M1b) non-small cell lung cancer, adenocarcinoma of the right lower lobe diagnosed in October 2014  Molecular profile: Negative forRET, ALK, BRAF, MET, EGFR.  Positive for ERBB2 A775T, [id number], KRAS G13E, MAP2K1 D67N (see full report).   PRIOR THERAPY: 1) Systemic chemotherapy with carboplatin for AUC of 5 and Alimta 500 mg/M2 every 3 weeks, status post 6 cycles with stable disease. First dose on 10/23/2013. 2) Maintenance systemic chemotherapy with single agent Alimta 500 mg/M2 every 3 weeks. Status post 8 cycles.  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 Sondra Come completed on 12/16/2014. 4) stereotactic radiotherapy to the enlarging right upper lobe lung nodule under the care of Dr. Sondra Come completed on 11/22/2016. 5) Immunotherapy with Nivolumab 240 mg IV every 2 weeks, status post 60 cycles. First cycle was given on 01/28/2015. Last dose 07/05/2017.  CURRENT THERAPY: Immunotherapy with Nivolumab 480 mg IV every 4 weeks. First dose 07/19/2017. Status post 25 cycles.  INTERVAL HISTORY: Anthony Curry. 78 y.o. male returns to the clinic for a follow-up visit. The patient was recently hospitalized for hyponatremia and ataxia.  The patient has a prior history for hyponatremia and is prescribed demeclocycline for this medical issue.  He also is on a 40 ounce fluid restriction. The patient is frustrated today after hearing different recommendations from different physicians regarding management of his condition. While he was in the hospital, the patient was prescribed gabapentin for his peripheral neuropathy. The patient has been prescribed amitriptyline the past as well.  The patient notes intolerance of both of these medications and states that they exacerbate his chronic complaints of dizziness.    Regarding his treatment with immunotherapy, the patient has been tolerating his treatment fairly well without any concerning adverse side effects.  He denies any fever, chills, night sweats, or weight loss.  He denies any chest pain, shortness of breath, cough, or hemoptysis.  He denies any nausea, vomiting, diarrhea, or constipation.  Denies any headaches or visual changes, weakness, or seizures. He denies any rashes or skin changes.  He is here today for evaluation before starting cycle #26.  MEDICAL HISTORY: Past Medical History:  Diagnosis Date  . 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[place])   . Adrenal mass ([place])   . Alcoholism in remission ([place])   . Anemia of chronic disease   . 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 Anxiety   . BPH (benign prostatic hyperplasia)   . Cancer ([place])    small cell/lung  . Complication of anesthesia 09/08/2015    JITTERY AFTER GALLBLADDER SURGERY  . GERD (gastroesophageal reflux disease)   . History of radiation therapy 11/14/16, 11/18/16, 11/22/16   Left upper lung: 54 Gy in 3 fractions  . Hypertension   . Hypertension 12/07/2016  . Neoplastic malignant related fatigue    IV tx. every 2 weeks last9-14-16 (Dr. Earlie Server), radiation last tx. 6 months  . 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 Persistent atrial fibrillation   . Pulmonary nodules   . Radiation 12/02/14-12/16/14   Left adrenal gland 30 Gy in 10 fractions    ALLERGIES:  has No Known Allergies.  MEDICATIONS:  Current Outpatient Medications  Medication Sig Dispense Refill  . ALPRAZolam (XANAX) 0.25 MG tablet TAKE 1 TABLET(0.25 MG) BY MOUTH AT BEDTIME AS NEEDED FOR ANXIETY 30 tablet 0  . apixaban (ELIQUIS)  5 MG TABS tablet Take 5 mg by mouth 2 (two) times daily.    . Ascorbic Acid (VITAMIN C PO) Take 1,000 mg by mouth daily.     . B Complex-C (SUPER B COMPLEX PO) Take 1 each by mouth daily.     . calcium carbonate (OS-CAL) 600 MG TABS Take 600 mg by mouth daily with breakfast.    .  demeclocycline (DECLOMYCIN) 150 MG tablet Take 2 tablets twice a day 120 tablet 2  . diltiazem (CARDIZEM CD) 180 MG 24 hr capsule Take 1 capsule (180 mg total) by mouth daily at 12 noon. 90 capsule 3  . gabapentin (NEURONTIN) 300 MG capsule Take 1 capsule (300 mg total) by mouth at bedtime. 30 capsule 0  . HYDROcodone-acetaminophen (NORCO/VICODIN) 5-325 MG tablet Take 1 tablet by mouth every 6 (six) hours as needed for moderate pain. 40 tablet 0  . KLOR-CON M20 20 MEQ tablet Take 20 mEq by mouth every morning.     . Lactobacillus (ACIDOPHILUS) CAPS capsule Take 1 capsule by mouth daily.    [name] levothyroxine (SYNTHROID, LEVOTHROID) 50 MCG tablet TAKE 1 TABLET (50 MCG TOTAL) BY MOUTH DAILY BEFORE BREAKFAST. 90 tablet 0  . MAGNESIUM PO Take 400 mg by mouth daily.     . Multiple Vitamin (MULTIVITAMIN WITH MINERALS) TABS Take 1 tablet by mouth daily.    . Nivolumab (OPDIVO IV) Inject as directed every 2 weeks    . Nutritional Supplements (ENSURE COMPLETE PO) Take 1 Can by mouth 2 (two) times daily.    . Omega-3 Fatty Acids (FISH OIL PO) Take 1 tablet by mouth daily.    . tamsulosin (FLOMAX) 0.4 MG CAPS capsule Take 0.4 mg by mouth daily at 12 noon.     . thiamine (VITAMIN B-1) 100 MG tablet Take 1 tablet (100 mg total) by mouth daily. 30 tablet 6  . vitamin E 400 UNIT capsule Take 400 Units by mouth daily.     No current facility-administered medications for this visit.    Facility-Administered Medications Ordered in Other Visits  Medication Dose Route Frequency Provider Last Rate Last Dose  . heparin lock flush 100 unit/mL  500 Units Intracatheter Once PRN [name], MD      . sodium chloride flush (NS) 0.9 % injection 10 mL  10 mL Intracatheter PRN [name], MD        SURGICAL HISTORY:  Past Surgical History:  Procedure Laterality Date  . ANKLE ARTHRODESIS  1995   rt fx-hardware in  . CARDIOVERSION N/A 09/29/2014   Procedure: CARDIOVERSION;  Surgeon: Josue Hector, MD;   Location: [facility] ENDOSCOPY;  Service: Cardiovascular;  Laterality: N/A;  . CHOLECYSTECTOMY  09/08/2015    laproscopic   . CHOLECYSTECTOMY N/A 09/08/2015   Procedure: LAPAROSCOPIC CHOLECYSTECTOMY WITH ATTEMPTED INTRAOPERATIVE CHOLANGIOGRAM ;  Surgeon: Fanny Skates, MD;  Location: [place];  Service: General;  Laterality: N/A;  . COLONOSCOPY    . ERCP N/A 09/02/2015   Procedure: ENDOSCOPIC RETROGRADE CHOLANGIOPANCREATOGRAPHY (ERCP);  Surgeon: Arta Silence, MD;  Location: [facility] ENDOSCOPY;  Service: Endoscopy;  Laterality: N/A;  . ERCP N/A 09/07/2015   Procedure: ENDOSCOPIC RETROGRADE CHOLANGIOPANCREATOGRAPHY (ERCP);  Surgeon: Clarene Essex, MD;  Location: [facility] ENDOSCOPY;  Service: Endoscopy;  Laterality: N/A;  . EXCISION / CURETTAGE BONE CYST FINGER  2005   rt thumb  . HEMORRHOID SURGERY    . Percutaneous Cholecytostomy tube     IVR insertion 06-13-15(Dr. Vernard Gambles)- 08-28-15 remains in place to drainage bag  . TONSILLECTOMY    .  TOTAL SHOULDER ARTHROPLASTY  02/03/2012   Procedure: TOTAL SHOULDER ARTHROPLASTY;  Surgeon: [name], MD;  Location: [place];  Service: Orthopedics;  Laterality: Left;    REVIEW OF SYSTEMS:   Review of Systems  Constitutional: Positive for fatigue and generalized weakness. Negative for appetite change, chills, fever and unexpected weight change.  HENT: Negative for mouth sores, nosebleeds, sore throat and trouble swallowing.   Eyes: Negative for eye problems and icterus.  Respiratory: Negative for cough, hemoptysis, shortness of breath and wheezing.   Cardiovascular: Negative for chest pain and leg swelling.  Gastrointestinal: Negative for abdominal pain, constipation, diarrhea, nausea and vomiting.  Genitourinary: Negative for bladder incontinence, difficulty urinating, dysuria, frequency and hematuria.   Musculoskeletal: Negative for back pain, gait problem, neck pain and neck stiffness.  Skin: Negative for itching and rash.  Neurological: Positive for  chronic dizziness and peripheral neuropathy. Negative for extremity weakness, gait problem, headaches, light-headedness and seizures.  Hematological: Negative for adenopathy. Does not bruise/bleed easily.  Psychiatric/Behavioral: Positive for insomnia. Negative for confusion, and depression. The patient is not nervous/anxious.     PHYSICAL EXAMINATION:  Blood pressure (!) 153/88, pulse 80, temperature 98.5 F (36.9 C), temperature source Oral, resp. rate 18, height 5' 11"  (1.803 m), weight 140 lb 11.2 oz (63.8 kg), SpO2 98 %.  ECOG PERFORMANCE STATUS: 1 - Symptomatic but completely ambulatory  Physical Exam  Constitutional: Oriented to person, place, and time and well-developed, well-nourished, and in no distress.  HENT:  Head: Normocephalic and atraumatic.  Mouth/Throat: Oropharynx is clear and moist. No oropharyngeal exudate.  Eyes: Conjunctivae are normal. Right eye exhibits no discharge. Left eye exhibits no discharge. No scleral icterus.  Neck: Normal range of motion. Neck supple.  Cardiovascular: Normal rate, regular rhythm, normal heart sounds and intact distal pulses.   Pulmonary/Chest: Effort normal and breath sounds normal. No respiratory distress. No wheezes. No rales.  Abdominal: Soft. Bowel sounds are normal. Exhibits no distension and no mass. There is no tenderness.  Musculoskeletal: Normal range of motion. Exhibits no edema.  Lymphadenopathy:    No cervical adenopathy.  Neurological: Alert and oriented to person, place, and time. Exhibits normal muscle tone. Gait normal. Coordination normal.  Skin: Skin is warm and dry. No rash noted. Not diaphoretic. No erythema. No pallor.  Psychiatric: Mood, memory and judgment normal.  Vitals reviewed.  LABORATORY DATA: Lab Results  Component Value Date   WBC 4.9 07/09/2019   HGB 13.8 07/09/2019   HCT 40.2 07/09/2019   MCV 91.8 07/09/2019   PLT 265 07/09/2019      Chemistry      Component Value Date/Time   NA 133 (L)  07/09/2019 1016   NA 132 (L) 12/06/2017 0930   K 4.4 07/09/2019 1016   K 4.0 12/06/2017 0930   CL 98 07/09/2019 1016   CO2 26 07/09/2019 1016   CO2 26 12/06/2017 0930   BUN 21 07/09/2019 1016   BUN 15.4 12/06/2017 0930   CREATININE 0.77 07/09/2019 1016   CREATININE 0.9 12/06/2017 0930      Component Value Date/Time   CALCIUM 9.5 07/09/2019 1016   CALCIUM 9.9 12/06/2017 0930   ALKPHOS 101 07/09/2019 1016   ALKPHOS 107 12/06/2017 0930   AST 16 07/09/2019 1016   AST 20 12/06/2017 0930   ALT 14 07/09/2019 1016   ALT 14 12/06/2017 0930   BILITOT 0.7 07/09/2019 1016   BILITOT 0.76 12/06/2017 0930       RADIOGRAPHIC STUDIES:  Mr  Brain W And Wo Contrast  Result Date: 06/30/2019 CLINICAL DATA:  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 Ataxia. EXAM: MRI HEAD WITHOUT AND WITH CONTRAST TECHNIQUE: Multiplanar, multiecho pulse sequences of the brain and surrounding structures were obtained without and with intravenous contrast. CONTRAST:  6 mL Gadavist COMPARISON:  Brain MRI 07/06/2016 FINDINGS: BRAIN: There is no acute infarct, acute hemorrhage or extra-axial collection. Early confluent hyperintense T2-weighted signal of the periventricular and deep white matter, most commonly due to chronic ischemic microangiopathy. There is generalized atrophy without lobar predilection. The midline structures are normal. No abnormal contrast enhancement. VASCULAR: Susceptibility-sensitive sequences show no chronic microhemorrhage or superficial siderosis. The major intracranial arterial and venous sinus flow voids are normal. SKULL AND UPPER CERVICAL SPINE: Hypertrophy at the atlantoaxial joint. SINUSES/ORBITS: There are no fluid levels or advanced mucosal thickening. The mastoid air cells and middle ear cavities are free of fluid. The orbits are normal. IMPRESSION: Chronic white matter disease without acute intracranial abnormality. No contrast  enhancing intracranial lesions. Electronically Signed   By: Ulyses Jarred M.D.   On: 06/30/2019 05:37   Dg Chest Port 1 View  Result Date: 06/28/2019 CLINICAL DATA:  Chest pain EXAM: PORTABLE CHEST 1 VIEW COMPARISON:  05/09/2019 FINDINGS: Normal heart size and stable mediastinal contours. Hyperinflation and mild lung scarring. There is no edema, consolidation, effusion, or pneumothorax. Remote bilateral rib fractures. Remote right humeral neck fracture. Lucency over the lateral right chest wall is attributed to overlapping material. IMPRESSION: No acute finding when compared with priors. COPD. Electronically Signed   By: Monte Fantasia M.D.   On: 06/28/2019 07:15     ASSESSMENT/PLAN:  This is a very pleasant 78 year old Caucasian male diagnosed with metastatic non-small cell lung cancer, adenocarcinoma of the right lower lobe. He was diagnosed in October 2014.  He has no actionable mutations.  The patient is currently undergoing second line immunotherapy with nivolumab. He is status post 60 cycles of nivolumab IV every 2 weeks.  He was recently switched to nivolumab 480 milligrams IV every 4 weeks.  He is status post 25 cycles. He is tolerating it well without any adverse side effects.  The patient was recently hospitalized for his chronic hyponatremia. He is feeling slightly better since his hospitalization but he expressed frustrations today with his current health state.   The patient was seen with Dr. [name] today.  Labs were reviewed with the patient.  We recommend that he proceed with cycle #26 today scheduled.  We will see him back for follow-up visit in 4 weeks for evaluation before starting cycle #7.  Regarding the patient's history for cancer related pain, insomnia, as well as anxiety, I sent a refill for Xanax as well as Norco his pharmacy. The patient knows that these medications are to be used on an as needed bases only.   The patient was previously prescribed gabapentin for his  peripheral neuropathy.  The patient was also prescribed amitriptyline as well.  He  states that he has intolerance to both of these medications and that they exacerbate his chronic symptoms of dizziness.  I discussed with the patient that is okay to discontinue these medications since he was having adverse side effects. He has felt slightly better since not taking them over the last few days.  For the patient's history of hyponatremia, I provided the patient a prescription for his demeclocycline. He was advised to take two 150 mg tablets twice a day of demeclocycline  for his hyponatremia. He also was encouraged to continue his 40 ounce fluid restriction as well as to slightly increase his salt intake in his diet.   The patient was advised to call immediately if he has any concerning symptoms in the interval. The patient voices understanding of current disease status and treatment options and is in agreement with the current care plan. All questions were answered. The patient knows to call the clinic with any problems, questions or concerns. We can certainly see the patient much sooner if necessary  No orders of the defined types were placed in this encounter.    Cassandra L Heilingoetter, PA-C 07/09/19  ADDENDUM: Hematology/Oncology Attending: I had a face-to-face encounter with the patient today.  I recommended his care plan.  This is a very pleasant 78 years old white male with metastatic non-small cell lung cancer, adenocarcinoma status post several chemotherapy regimens and he is currently undergoing treatment with immunotherapy with nivolumab 480 mg IV every 4 weeks status post 25 cycles. The patient has been tolerating his treatment well but recently admitted to [facility] with increasing fatigue and weakness as well as dizzy spells, neuropathy as well as insomnia and hyponatremia. He is feeling a little bit better today and his serum sodium has improved on demeclocycline which is  currently increased to 300 mg p.o. every morning and 150 mg p.o. every afternoon. He has been using Xanax more frequently in the past and the patient was under the impression that he cannot use it anymore. I recommended for the patient to resume his treatment with nivolumab and he will proceed with cycle #26 today as planned. For the anxiety and insomnia we will give him refill for the next but only to be used nightly. We will also refill his pain medication. The patient will come back for follow-up visit in 4 weeks for evaluation before the next cycle of his treatment. He was advised to call immediately if he has any other concerning symptoms in the interval. He had a lot of questions today and we spent a reasonable amount of time discussing his case as well as the several changes in his medication and treatment. I spent 30 minutes counseling the patient face to face. The total time spent in the appointment was 40 minutes.  Disclaimer: This note was dictated with voice recognition software. Similar sounding words can inadvertently be transcribed and may be missed upon review. Eilleen Kempf, MD 07/09/19

## 2019-07-09 NOTE — Patient Instructions (Signed)
[facility] Discharge Instructions for Patients Receiving Chemotherapy  Today you received the following chemotherapy agents Opdivo  To help prevent nausea and vomiting after your treatment, we encourage you to take your nausea medication as directed   If you develop nausea and vomiting that is not controlled by your nausea medication, call the clinic.   BELOW ARE SYMPTOMS THAT SHOULD BE REPORTED IMMEDIATELY:  *FEVER GREATER THAN 100.5 F  *CHILLS WITH OR WITHOUT FEVER  NAUSEA AND VOMITING THAT IS NOT CONTROLLED WITH YOUR NAUSEA MEDICATION  *UNUSUAL SHORTNESS OF BREATH  *UNUSUAL BRUISING OR BLEEDING  TENDERNESS IN MOUTH AND THROAT WITH OR WITHOUT PRESENCE OF ULCERS  *URINARY PROBLEMS  *BOWEL PROBLEMS  UNUSUAL RASH Items with * indicate a potential emergency and should be followed up as soon as possible.  Feel free to call the clinic should you have any questions or concerns. The clinic phone number is [phone].  Please show the CHEMO ALERT CARD at check-in to the Emergency Department and triage nurse.   

## 2019-07-10 DIAGNOSIS — H353112 Nonexudative age-related macular degeneration, right eye, intermediate dry stage: Secondary | ICD-10-CM | POA: Diagnosis not present

## 2019-07-11 DIAGNOSIS — D630 Anemia in neoplastic disease: Secondary | ICD-10-CM | POA: Diagnosis not present

## 2019-07-11 DIAGNOSIS — I10 Essential (primary) hypertension: Secondary | ICD-10-CM | POA: Diagnosis not present

## 2019-07-11 DIAGNOSIS — G629 Polyneuropathy, unspecified: Secondary | ICD-10-CM | POA: Diagnosis not present

## 2019-07-11 DIAGNOSIS — C3431 Malignant neoplasm of lower lobe, right bronchus or lung: Secondary | ICD-10-CM | POA: Diagnosis not present

## 2019-07-11 DIAGNOSIS — C7970 Secondary malignant neoplasm of unspecified adrenal gland: Secondary | ICD-10-CM | POA: Diagnosis not present

## 2019-07-11 DIAGNOSIS — I4819 Other persistent atrial fibrillation: Secondary | ICD-10-CM | POA: Diagnosis not present

## 2019-07-16 DIAGNOSIS — G629 Polyneuropathy, unspecified: Secondary | ICD-10-CM | POA: Diagnosis not present

## 2019-07-16 DIAGNOSIS — D630 Anemia in neoplastic disease: Secondary | ICD-10-CM | POA: Diagnosis not present

## 2019-07-16 DIAGNOSIS — C3431 Malignant neoplasm of lower lobe, right bronchus or lung: Secondary | ICD-10-CM | POA: Diagnosis not present

## 2019-07-16 DIAGNOSIS — I4819 Other persistent atrial fibrillation: Secondary | ICD-10-CM | POA: Diagnosis not present

## 2019-07-16 DIAGNOSIS — C7970 Secondary malignant neoplasm of unspecified adrenal gland: Secondary | ICD-10-CM | POA: Diagnosis not present

## 2019-07-16 DIAGNOSIS — I10 Essential (primary) hypertension: Secondary | ICD-10-CM | POA: Diagnosis not present

## 2019-07-18 DIAGNOSIS — E871 Hypo-osmolality and hyponatremia: Secondary | ICD-10-CM | POA: Diagnosis not present

## 2019-07-18 DIAGNOSIS — G4701 Insomnia due to medical condition: Secondary | ICD-10-CM | POA: Diagnosis not present

## 2019-07-18 DIAGNOSIS — G629 Polyneuropathy, unspecified: Secondary | ICD-10-CM | POA: Diagnosis not present

## 2019-07-18 DIAGNOSIS — F419 Anxiety disorder, unspecified: Secondary | ICD-10-CM | POA: Diagnosis not present

## 2019-07-19 DIAGNOSIS — E871 Hypo-osmolality and hyponatremia: Secondary | ICD-10-CM | POA: Diagnosis not present

## 2019-07-22 DIAGNOSIS — R3914 Feeling of incomplete bladder emptying: Secondary | ICD-10-CM | POA: Diagnosis not present

## 2019-07-22 DIAGNOSIS — N401 Enlarged prostate with lower urinary tract symptoms: Secondary | ICD-10-CM | POA: Diagnosis not present

## 2019-07-22 DIAGNOSIS — N410 Acute prostatitis: Secondary | ICD-10-CM | POA: Diagnosis not present

## 2019-07-23 DIAGNOSIS — C3431 Malignant neoplasm of lower lobe, right bronchus or lung: Secondary | ICD-10-CM | POA: Diagnosis not present

## 2019-07-23 DIAGNOSIS — I4819 Other persistent atrial fibrillation: Secondary | ICD-10-CM | POA: Diagnosis not present

## 2019-07-23 DIAGNOSIS — G629 Polyneuropathy, unspecified: Secondary | ICD-10-CM | POA: Diagnosis not present

## 2019-07-23 DIAGNOSIS — C7970 Secondary malignant neoplasm of unspecified adrenal gland: Secondary | ICD-10-CM | POA: Diagnosis not present

## 2019-07-23 DIAGNOSIS — I10 Essential (primary) hypertension: Secondary | ICD-10-CM | POA: Diagnosis not present

## 2019-07-23 DIAGNOSIS — D630 Anemia in neoplastic disease: Secondary | ICD-10-CM | POA: Diagnosis not present

## 2019-07-25 DIAGNOSIS — C3431 Malignant neoplasm of lower lobe, right bronchus or lung: Secondary | ICD-10-CM | POA: Diagnosis not present

## 2019-07-25 DIAGNOSIS — G629 Polyneuropathy, unspecified: Secondary | ICD-10-CM | POA: Diagnosis not present

## 2019-07-25 DIAGNOSIS — I10 Essential (primary) hypertension: Secondary | ICD-10-CM | POA: Diagnosis not present

## 2019-07-25 DIAGNOSIS — D630 Anemia in neoplastic disease: Secondary | ICD-10-CM | POA: Diagnosis not present

## 2019-07-25 DIAGNOSIS — I4819 Other persistent atrial fibrillation: Secondary | ICD-10-CM | POA: Diagnosis not present

## 2019-07-25 DIAGNOSIS — C7970 Secondary malignant neoplasm of unspecified adrenal gland: Secondary | ICD-10-CM | POA: Diagnosis not present

## 2019-07-26 ENCOUNTER — Ambulatory Visit (INDEPENDENT_AMBULATORY_CARE_PROVIDER_SITE_OTHER): Payer: Medicare Other | Admitting: Psychiatry

## 2019-07-26 ENCOUNTER — Other Ambulatory Visit: Payer: Self-pay

## 2019-07-26 DIAGNOSIS — C3431 Malignant neoplasm of lower lobe, right bronchus or lung: Secondary | ICD-10-CM | POA: Diagnosis not present

## 2019-07-26 DIAGNOSIS — F1021 Alcohol dependence, in remission: Secondary | ICD-10-CM

## 2019-07-26 DIAGNOSIS — F4323 Adjustment disorder with mixed anxiety and depressed mood: Secondary | ICD-10-CM

## 2019-07-26 DIAGNOSIS — G620 Drug-induced polyneuropathy: Secondary | ICD-10-CM | POA: Diagnosis not present

## 2019-07-26 DIAGNOSIS — T451X5A Adverse effect of antineoplastic and immunosuppressive drugs, initial encounter: Secondary | ICD-10-CM

## 2019-07-26 NOTE — Progress Notes (Signed)
PROBLEM-FOCUSED INITIAL PSYCHOTHERAPY EVALUATION Luan Moore, PhD LP [facility]  Name: Anthony Curry. Date: 07/26/2019 Time spent: 55 min MRN: [id number] DOB: 1941/05/11 Guardian/Payee: self  PCP: [name], MD Documentation requested on this visit: No  PROBLEM HISTORY Reason for Visit /Presenting Problem:  Chief Complaint  Patient presents with  . Establish Care  Presents with a 3-wheeled walker.  Narrative/History of Present Illness Referred by PCP for treatment of worry.  PT reports "Had a bout of something like this many years ago."  Prior episode of worry had to do with accepting a job, turned out it was over his head, left it, felt better, but was restless, hard to soothe for some time.  Worries a lot "about bullshit".  Can feel heavy chest through the day until sedated at night.  A month ago felt really woozy, went to ER twice in a week, disaffected by what seemed like sloppy, uninformative care.  Anxiety flared after that.  Has a little SOB when doing PT at home (Tues/Thurs, walking).  Heart checks out OK.  In chemotherapy for lung cancer.  Not bothered by stage IV lung CA, accepted.  No SI, enjoying.  Will be 78 in October.  Never felt "old" until a month and a half ago.  Had a UTI a month ago, took antibiotic, urologist told him he's retaining urine.  Feels sick and tired of seeing doctors.  Normally hates hospitals and all the indignities and hurry up and wait involved.  Some anxiety with Q 3 mo CT results.    Sleep disturbance for a while.  Xanax works, over 5 years, to quiet worry and enable sleep.  Was jacked up to 0.5mg  after years on 0.25mg .  Was Rx'd Amitriptyline 50mg  for pain, neuropathy, and anxiety but the first few felt wrong, stopped.  Used to use TV to fall asleep -- now using soft music, with the TV on, but used to watch TV as the way to fall asleep.  Gets 6 hours sleep typically these days.  Tends to dread going to bed, concerned   COVID  is a concern.  Sorry to see no football season.  Tired of the limitations on lifestyle and having to consider his risk of fatal infection.    Prior Psychiatric Assessment/Treatment:   Outpatient treatment: yes, remotely.  PCP handles current meds Psychiatric hospitalization: none stated Psychological assessment/testing: none stated   Abuse History: Victim of abuse: Not assessed at this time / none suspected.   Victim of neglect: Not assessed at this time / none suspected.   Perpetrator of abuse/neglect: Not assessed at this time / none suspected.   Witness / Exposure to Domestic Violence: Not assessed at this time / none suspected.   Witness to Community Violence:  Not assessed at this time / none suspected.   Protective Services Involvement: No.   Report needed: No.    Substance Abuse History: Current substance abuse: No.   History of impactful substance use/abuse: Yes.  Alcohol, by hx   FAMILY/SOCIAL HISTORY Family of origin -- Not assessed Family of intention/current living situation -- PT and W moved in with daughter 4-5 years ago.  Living in 11th floor condo, paid for by daughter, who relocated to [place].  Wife likes the solitude and low maintenance, PT would like to have some outdoor access.  6 yrs married to [place].  Conflict brewing with wife, who wants to get out more, feels it's safe enough to do a few things.  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 Spiritually -- Spiritual/religious identification Not assessed Enjoyable activities -- music, reading, model-building  Other situational factors affecting treatment and prognosis: Stressors from the following areas: Health problems Barriers to service: none stated  Notable cultural sensitivities: none stated Strengths: Family and Self Advocate   MED/SURG HISTORY Med/surg history was not  reviewed with PT at this time.   Past Medical History:  Diagnosis Date  . 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 Adrenal mass ([place])   . Alcoholism in remission ([place])   . Anemia of chronic disease   . 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 Anxiety   . BPH (benign prostatic hyperplasia)   . Cancer ([place])    small cell/lung  . Complication of anesthesia 09/08/2015    JITTERY AFTER GALLBLADDER SURGERY  . GERD (gastroesophageal reflux disease)   . History of radiation therapy 11/14/16, 11/18/16, 11/22/16   Left upper lung: 54 Gy in 3 fractions  . Hypertension   . Hypertension 12/07/2016  . Neoplastic malignant related fatigue    IV tx. every 2 weeks last9-14-16 (Dr. Earlie Server), radiation last tx. 6 months  . 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 Persistent atrial fibrillation   . Pulmonary nodules   . Radiation 12/02/14-12/16/14   Left adrenal gland 30 Gy in 10 fractions     Past Surgical History:  Procedure Laterality Date  . ANKLE ARTHRODESIS  1995   rt fx-hardware in  . CARDIOVERSION N/A 09/29/2014   Procedure: CARDIOVERSION;  Surgeon: Josue Hector, MD;  [facility] END[facility]COPY;  Service: Cardiovascular;  Laterality: N/A;  . CHOLECYSTECTOMY  09/08/2015    laproscopic   . CHOLECYSTECTOMY N/A 09/08/2015   Procedure: LAPAROSCOPIC CHOLECYSTECTOMY WITH ATTEMPTED INTRAOPERATIVE CHOLANGIOGRAM ;  Surgeon: Fanny Skates, MD;  Location: [place];  Service: General;  Laterality: N/A;  . COLONOSCOPY    . ERCP N/A 09/02/2015   Procedure: ENDOSCOPIC RETROGRADE CHOLANGIOPANCREATOGRAPHY (ERCP);  Surgeon: Arta Silence, MD;  Location: [facility] ENDOSCOPY;  Service: Endoscopy;  Laterality: N/A;  . ERCP N/A 09/07/2015   Procedure: ENDOSCOPIC RETROGRADE CHOLANGIOPANCREATOGRAPHY (ERCP);  Surgeon: Clarene Essex, MD;  Location: [facility] ENDOSCOPY;  Service: Endoscopy;  Laterality: N/A;  . EXCISION / CURETTAGE BONE CYST FINGER  2005   rt thumb  . HEMORRHOID SURGERY    . Percutaneous Cholecytostomy tube      IVR insertion 06-13-15(Dr. Vernard Gambles)- 08-28-15 remains in place to drainage bag  . TONSILLECTOMY    . TOTAL SHOULDER ARTHROPLASTY  02/03/2012   Procedure: TOTAL SHOULDER ARTHROPLASTY;  Surgeon: [name], MD;  Location: [facility];  Service: Orthopedics;  Laterality: Left;    No Known Allergies  Medications (as listed in Epic): Current Outpatient Medications  Medication Sig Dispense Refill  . ALPRAZolam (XANAX) 0.25 MG tablet TAKE 1 TABLET(0.25 MG) BY MOUTH AT BEDTIME AS NEEDED FOR ANXIETY 30 tablet 0  . apixaban (ELIQUIS) 5 MG TABS tablet Take 5 mg by mouth 2 (two) times daily.    . Ascorbic Acid (VITAMIN C PO) Take 1,000 mg by mouth daily.     . B Complex-C (SUPER B COMPLEX PO) Take 1 each by mouth daily.     . calcium carbonate (OS-CAL) 600 MG TABS Take 600 mg by mouth daily with breakfast.    . demeclocycline (DECLOMYCIN) 150 MG tablet Take 2 tablets twice a day 120 tablet 2  .  diltiazem (CARDIZEM CD) 180 MG 24 hr capsule Take 1 capsule (180 mg total) by mouth daily at 12 noon. 90 capsule 3  . gabapentin (NEURONTIN) 300 MG capsule Take 1 capsule (300 mg total) by mouth at bedtime. (Patient not taking: Reported on 08/06/2019) 30 capsule 0  . KLOR-CON M20 20 MEQ tablet Take 20 mEq by mouth every morning.     . Lactobacillus (ACIDOPHILUS) CAPS capsule Take 1 capsule by mouth daily.    [name] levothyroxine (SYNTHROID, LEVOTHROID) 50 MCG tablet TAKE 1 TABLET (50 MCG TOTAL) BY MOUTH DAILY BEFORE BREAKFAST. 90 tablet 0  . MAGNESIUM PO Take 400 mg by mouth daily.     . Multiple Vitamin (MULTIVITAMIN WITH MINERALS) TABS Take 1 tablet by mouth daily.    . Nivolumab (OPDIVO IV) Inject as directed every 2 weeks    . Nutritional Supplements (ENSURE COMPLETE PO) Take 1 Can by mouth 2 (two) times daily.    . Omega-3 Fatty Acids (FISH OIL PO) Take 1 tablet by mouth daily.    . tamsulosin (FLOMAX) 0.4 MG CAPS capsule Take 0.4 mg by mouth daily at 12 noon.     . thiamine (VITAMIN B-1) 100 MG  tablet Take 1 tablet (100 mg total) by mouth daily. 30 tablet 6  . traMADol (ULTRAM) 50 MG tablet Take 1 tablet (50 mg total) by mouth 3 (three) times daily as needed. 90 tablet 0  . vitamin E 400 UNIT capsule Take 400 Units by mouth daily.     No current facility-administered medications for this visit.     MENTAL STATUS AND OBSERVATIONS Appearance:   Casual and walker     Behavior:  Appropriate and abrupt at times  Motor:  Normal and as assisted by walker  Speech/Language:   Clear and Coherent and mild pressure  Affect:  Appropriate and mildly irritable  Mood:  anxious and dysthymic  Thought process:  flight of ideas  Thought content:    WNL  Sensory/Perceptual disturbances:    WNL, possible hearing deficit  Orientation:  grossly intact  Attention:  Fair  Concentration:  Fair  Memory:  grossly intact  Fund of knowledge:   Fair  Insight:    Fair  Judgment:   Good  Impulse Control:  Fair  Initial Risk Assessment: Danger to Self: No Self-injurious Behavior: No Danger to Others: No Physical Aggression / Violence: No Duty to Warn: No Access to Firearms a concern: No Gang Involvement: No Patient / guardian was educated about steps to take if suicide or homicide risk level increases between visits: yes . While future psychiatric events cannot be accurately predicted, the patient does not currently require acute inpatient psychiatric care and does not currently meet [facility] involuntary commitment criteria.   DIAGNOSIS:    ICD-10-CM   1. Adjustment disorder with mixed anxiety and depressed mood  F43.23   2. Alcoholism in remission ([place])  F10.21   3. Malignant neoplasm of lower lobe of right lung (HCC)  C34.31   4. Chemotherapy-induced neuropathy ([place])  G62.0    T45.1X5A     INITIAL TREATMENT: . Ethical orientation and verbal consents to o privacy rights including, but not limited to, HIPAA provisions and any questions, EMR and use of e-PHI o patient responsibilities,  including scheduling and fair notice of changes, method of visit options and regulatory and financial conditions affecting them o expectations for working relationship in psychotherapy o expectations and consents for working partnerships with other health care disciplines, especially including medication  and other behavioral health providers . Support/validation for distressing symptoms . Confirmed rapport . Psychoeducation about anxiety control, oriented to likely skills needed.  Education about sleep hygiene and circadian cues. . Estimated outlook for therapy  Plan: . Cut off TV earlier -- do something else like music, or reading, or model-building, or writing if desired . Go head and get out shopping with [name] -- it will feel more normal . Will teach a relaxation method next time . Next visit with wife if able . Consider possibility of SSRI rather than tricyclic . Maintain medication as prescribed and work faithfully with relevant prescriber(s) if any changes are desired or seem indicated . Call the clinic on-call service, present to ER, or call 911 if any life-threatening psychiatric crisis Return in about 2 weeks (around 08/09/2019) for time as available.  Blanchie Serve, PhD  Luan Moore, PhD LP Clinical Psychologist, [facility] [address] [place], [place] [address] [phone]

## 2019-07-30 DIAGNOSIS — G629 Polyneuropathy, unspecified: Secondary | ICD-10-CM | POA: Diagnosis not present

## 2019-07-30 DIAGNOSIS — I4819 Other persistent atrial fibrillation: Secondary | ICD-10-CM | POA: Diagnosis not present

## 2019-07-30 DIAGNOSIS — C3431 Malignant neoplasm of lower lobe, right bronchus or lung: Secondary | ICD-10-CM | POA: Diagnosis not present

## 2019-07-30 DIAGNOSIS — D630 Anemia in neoplastic disease: Secondary | ICD-10-CM | POA: Diagnosis not present

## 2019-07-30 DIAGNOSIS — I10 Essential (primary) hypertension: Secondary | ICD-10-CM | POA: Diagnosis not present

## 2019-07-30 DIAGNOSIS — C7970 Secondary malignant neoplasm of unspecified adrenal gland: Secondary | ICD-10-CM | POA: Diagnosis not present

## 2019-08-03 DIAGNOSIS — C7970 Secondary malignant neoplasm of unspecified adrenal gland: Secondary | ICD-10-CM | POA: Diagnosis not present

## 2019-08-03 DIAGNOSIS — C3431 Malignant neoplasm of lower lobe, right bronchus or lung: Secondary | ICD-10-CM | POA: Diagnosis not present

## 2019-08-03 DIAGNOSIS — K219 Gastro-esophageal reflux disease without esophagitis: Secondary | ICD-10-CM | POA: Diagnosis not present

## 2019-08-03 DIAGNOSIS — N400 Enlarged prostate without lower urinary tract symptoms: Secondary | ICD-10-CM | POA: Diagnosis not present

## 2019-08-03 DIAGNOSIS — F1011 Alcohol abuse, in remission: Secondary | ICD-10-CM | POA: Diagnosis not present

## 2019-08-03 DIAGNOSIS — D630 Anemia in neoplastic disease: Secondary | ICD-10-CM | POA: Diagnosis not present

## 2019-08-03 DIAGNOSIS — I10 Essential (primary) hypertension: Secondary | ICD-10-CM | POA: Diagnosis not present

## 2019-08-03 DIAGNOSIS — M19012 Primary osteoarthritis, left shoulder: Secondary | ICD-10-CM | POA: Diagnosis not present

## 2019-08-03 DIAGNOSIS — G629 Polyneuropathy, unspecified: Secondary | ICD-10-CM | POA: Diagnosis not present

## 2019-08-03 DIAGNOSIS — G4700 Insomnia, unspecified: Secondary | ICD-10-CM | POA: Diagnosis not present

## 2019-08-03 DIAGNOSIS — I4819 Other persistent atrial fibrillation: Secondary | ICD-10-CM | POA: Diagnosis not present

## 2019-08-03 DIAGNOSIS — F419 Anxiety disorder, unspecified: Secondary | ICD-10-CM | POA: Diagnosis not present

## 2019-08-06 ENCOUNTER — Encounter: Payer: Self-pay | Admitting: Internal Medicine

## 2019-08-06 ENCOUNTER — Inpatient Hospital Stay: Payer: Medicare Other | Attending: Internal Medicine

## 2019-08-06 ENCOUNTER — Inpatient Hospital Stay: Payer: Medicare Other

## 2019-08-06 ENCOUNTER — Telehealth: Payer: Self-pay | Admitting: Internal Medicine

## 2019-08-06 ENCOUNTER — Other Ambulatory Visit: Payer: Self-pay

## 2019-08-06 ENCOUNTER — Inpatient Hospital Stay (HOSPITAL_BASED_OUTPATIENT_CLINIC_OR_DEPARTMENT_OTHER): Payer: Medicare Other | Admitting: Internal Medicine

## 2019-08-06 VITALS — BP 131/67 | HR 81 | Temp 98.2°F | Resp 18 | Ht 71.0 in | Wt 141.8 lb

## 2019-08-06 DIAGNOSIS — I10 Essential (primary) hypertension: Secondary | ICD-10-CM | POA: Diagnosis not present

## 2019-08-06 DIAGNOSIS — C3490 Malignant neoplasm of unspecified part of unspecified bronchus or lung: Secondary | ICD-10-CM

## 2019-08-06 DIAGNOSIS — Z5112 Encounter for antineoplastic immunotherapy: Secondary | ICD-10-CM

## 2019-08-06 DIAGNOSIS — Z79899 Other long term (current) drug therapy: Secondary | ICD-10-CM | POA: Insufficient documentation

## 2019-08-06 DIAGNOSIS — C3431 Malignant neoplasm of lower lobe, right bronchus or lung: Secondary | ICD-10-CM | POA: Diagnosis not present

## 2019-08-06 DIAGNOSIS — R5382 Chronic fatigue, unspecified: Secondary | ICD-10-CM

## 2019-08-06 LAB — CBC WITH DIFFERENTIAL (CANCER CENTER ONLY)
Abs Immature Granulocytes: 0.01 10*3/uL (ref 0.00–0.07)
Basophils Absolute: 0 10*3/uL (ref 0.0–0.1)
Basophils Relative: 0 %
Eosinophils Absolute: 0.2 10*3/uL (ref 0.0–0.5)
Eosinophils Relative: 4 %
HCT: 40 % (ref 39.0–52.0)
Hemoglobin: 13.9 g/dL (ref 13.0–17.0)
Immature Granulocytes: 0 %
Lymphocytes Relative: 23 %
Lymphs Abs: 1.1 10*3/uL (ref 0.7–4.0)
MCH: 31.2 pg (ref 26.0–34.0)
MCHC: 34.8 g/dL (ref 30.0–36.0)
MCV: 89.9 fL (ref 80.0–100.0)
Monocytes Absolute: 0.6 10*3/uL (ref 0.1–1.0)
Monocytes Relative: 14 %
Neutro Abs: 2.7 10*3/uL (ref 1.7–7.7)
Neutrophils Relative %: 59 %
Platelet Count: 205 10*3/uL (ref 150–400)
RBC: 4.45 MIL/uL (ref 4.22–5.81)
RDW: 13.2 % (ref 11.5–15.5)
WBC Count: 4.6 10*3/uL (ref 4.0–10.5)
nRBC: 0 % (ref 0.0–0.2)

## 2019-08-06 LAB — CMP (CANCER CENTER ONLY)
ALT: 13 U/L (ref 0–44)
AST: 17 U/L (ref 15–41)
Albumin: 3.5 g/dL (ref 3.5–5.0)
Alkaline Phosphatase: 113 U/L (ref 38–126)
Anion gap: 8 (ref 5–15)
BUN: 22 mg/dL (ref 8–23)
CO2: 25 mmol/L (ref 22–32)
Calcium: 9 mg/dL (ref 8.9–10.3)
Chloride: 99 mmol/L (ref 98–111)
Creatinine: 0.83 mg/dL (ref 0.61–1.24)
GFR, Est AFR Am: >60 mL/min (ref >60)
GFR, Estimated: >60 mL/min (ref >60)
Glucose, Bld: 100 mg/dL — ABNORMAL HIGH (ref 70–99)
Potassium: 4.3 mmol/L (ref 3.5–5.1)
Sodium: 132 mmol/L — ABNORMAL LOW (ref 135–145)
Total Bilirubin: 0.6 mg/dL (ref 0.3–1.2)
Total Protein: 5.5 g/dL — ABNORMAL LOW (ref 6.5–8.1)

## 2019-08-06 LAB — TSH: TSH: 3.112 u[IU]/mL (ref 0.320–4.118)

## 2019-08-06 MED ORDER — TRAMADOL HCL 50 MG PO TABS: 50.0000 mg | ORAL_TABLET | Freq: Three times a day (TID) | ORAL | 0 refills | Status: DC | PRN

## 2019-08-06 MED ORDER — SODIUM CHLORIDE 0.9 % IV SOLN
Freq: Once | INTRAVENOUS | Status: AC
  Administered 2019-08-06: 11:00:00 via INTRAVENOUS
  Filled 2019-08-06: qty 250

## 2019-08-06 MED ORDER — SODIUM CHLORIDE 0.9 % IV SOLN
480.0000 mg | Freq: Once | INTRAVENOUS | Status: AC
  Administered 2019-08-06: 12:00:00 480 mg via INTRAVENOUS
  Filled 2019-08-06: qty 48

## 2019-08-06 NOTE — Telephone Encounter (Signed)
Gave avs and calendar ° °

## 2019-08-06 NOTE — Progress Notes (Signed)
[place] [phone]   [phone]  OFFICE PROGRESS NOTE  [name], MD [place] [address]  DIAGNOSIS: Stage IV (T1a, N0, M1b) non-small cell lung cancer, adenocarcinoma of the right lower lobe diagnosed in October 2014  Molecular profile: Negative forRET, ALK, BRAF, MET, EGFR.  Positive for ERBB2 A775T, [id number], KRAS G13E, MAP2K1 D67N (see full report).   PRIOR THERAPY:  1) Systemic chemotherapy with carboplatin for AUC of 5 and Alimta 500 mg/M2 every 3 weeks, status post 6 cycles with stable disease. First dose on 10/23/2013. 2) Maintenance systemic chemotherapy with single agent Alimta 500 mg/M2 every 3 weeks. Status post 8 cycles.  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 Sondra Come completed on 12/16/2014. 4) stereotactic radiotherapy to the enlarging right upper lobe lung nodule under the care of Dr. Sondra Come completed on 11/22/2016. 5) Immunotherapy with Nivolumab 240 mg IV every 2 weeks, status post 60 cycles. First cycle was given on 01/28/2015. Last dose 07/05/2017.  CURRENT THERAPY: Immunotherapy with Nivolumab 480 mg IV every 4 weeks. First dose 07/19/2017. Status post 26 cycles.  INTERVAL HISTORY: Anthony Curry. 78 y.o. male returns to the clinic today for follow-up visit accompanied by his wife.  The patient is feeling fine today with no concerning complaints except for the fatigue and dizzy spells.  He denied having any current chest pain, shortness of breath, cough or hemoptysis.  He denied having any fever or chills.  He has no nausea, vomiting, diarrhea or constipation.  He denied having any significant weight loss or night sweats.  He has been tolerating his treatment with nivolumab fairly well.  He would like to change his pain medication from Norco to tramadol because it lasted longer for him.  He is here today for evaluation before starting  cycle #27.  MEDICAL HISTORY: Past Medical History:  Diagnosis Date  . 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 Adrenal mass ([place])   . Alcoholism in remission ([place])   . Anemia of chronic disease   . 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 Anxiety   . BPH (benign prostatic hyperplasia)   . Cancer ([place])    small cell/lung  . Complication of anesthesia 09/08/2015    JITTERY AFTER GALLBLADDER SURGERY  . GERD (gastroesophageal reflux disease)   . History of radiation therapy 11/14/16, 11/18/16, 11/22/16   Left upper lung: 54 Gy in 3 fractions  . Hypertension   . Hypertension 12/07/2016  . Neoplastic malignant related fatigue    IV tx. every 2 weeks last9-14-16 (Dr. Earlie Server), radiation last tx. 6 months  . 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 Persistent atrial fibrillation   . Pulmonary nodules   . Radiation 12/02/14-12/16/14   Left adrenal gland 30 Gy in 10 fractions    ALLERGIES:  has No Known Allergies.  MEDICATIONS:  Current Outpatient Medications  Medication Sig Dispense Refill  . ALPRAZolam (XANAX) 0.25 MG tablet TAKE 1 TABLET(0.25 MG) BY MOUTH AT BEDTIME AS NEEDED FOR ANXIETY 30 tablet 0  . apixaban (ELIQUIS) 5 MG TABS tablet Take 5 mg by mouth 2 (two) times daily.    . Ascorbic Acid (VITAMIN C PO) Take 1,000 mg by mouth daily.     . B Complex-C (SUPER B COMPLEX PO) Take 1 each by mouth daily.     . calcium carbonate (OS-CAL) 600 MG TABS Take 600 mg by mouth daily with  breakfast.    . demeclocycline (DECLOMYCIN) 150 MG tablet Take 2 tablets twice a day 120 tablet 2  . diltiazem (CARDIZEM CD) 180 MG 24 hr capsule Take 1 capsule (180 mg total) by mouth daily at 12 noon. 90 capsule 3  . HYDROcodone-acetaminophen (NORCO/VICODIN) 5-325 MG tablet Take 1 tablet by mouth every 6 (six) hours as needed for moderate pain. 40 tablet 0  . KLOR-CON M20 20 MEQ tablet Take 20 mEq by mouth every morning.     . Lactobacillus (ACIDOPHILUS) CAPS capsule Take 1 capsule by mouth daily.     [name] levothyroxine (SYNTHROID, LEVOTHROID) 50 MCG tablet TAKE 1 TABLET (50 MCG TOTAL) BY MOUTH DAILY BEFORE BREAKFAST. 90 tablet 0  . MAGNESIUM PO Take 400 mg by mouth daily.     . Multiple Vitamin (MULTIVITAMIN WITH MINERALS) TABS Take 1 tablet by mouth daily.    . Nivolumab (OPDIVO IV) Inject as directed every 2 weeks    . Nutritional Supplements (ENSURE COMPLETE PO) Take 1 Can by mouth 2 (two) times daily.    . Omega-3 Fatty Acids (FISH OIL PO) Take 1 tablet by mouth daily.    . tamsulosin (FLOMAX) 0.4 MG CAPS capsule Take 0.4 mg by mouth daily at 12 noon.     . thiamine (VITAMIN B-1) 100 MG tablet Take 1 tablet (100 mg total) by mouth daily. 30 tablet 6  . vitamin E 400 UNIT capsule Take 400 Units by mouth daily.    [name] gabapentin (NEURONTIN) 300 MG capsule Take 1 capsule (300 mg total) by mouth at bedtime. (Patient not taking: Reported on 08/06/2019) 30 capsule 0   No current facility-administered medications for this visit.     SURGICAL HISTORY:  Past Surgical History:  Procedure Laterality Date  . ANKLE ARTHRODESIS  1995   rt fx-hardware in  . CARDIOVERSION N/A 09/29/2014   Procedure: CARDIOVERSION;  Surgeon: Josue Hector, MD;  Location: [facility] ENDOSCOPY;  Service: Cardiovascular;  Laterality: N/A;  . CHOLECYSTECTOMY  09/08/2015    laproscopic   . CHOLECYSTECTOMY N/A 09/08/2015   Procedure: LAPAROSCOPIC CHOLECYSTECTOMY WITH ATTEMPTED INTRAOPERATIVE CHOLANGIOGRAM ;  Surgeon: Fanny Skates, MD;  Location: [facility];  Service: General;  Laterality: N/A;  . COLONOSCOPY    . ERCP N/A 09/02/2015   Procedure: ENDOSCOPIC RETROGRADE CHOLANGIOPANCREATOGRAPHY (ERCP);  Surgeon: Arta Silence, MD;  Location: [facility] ENDOS[facility];  Service: Endoscopy;  Laterality: N/A;  . ERCP N/A 09/07/2015   Procedure: ENDOSCOPIC RETROGRADE CHOLANGIOPANCREATOGRAPHY (ERCP);  Surgeon: Clarene Essex, MD;  Location: [facility] ENDOSCOPY;  Service: Endoscopy;  Laterality: N/A;  . EXCISION / CURETTAGE BONE CYST FINGER  2005   rt thumb  .  HEMORRHOID SURGERY    . Percutaneous Cholecytostomy tube     IVR insertion 06-13-15(Dr. Vernard Gambles)- 08-28-15 remains in place to drainage bag  . TONSILLECTOMY    . TOTAL SHOULDER ARTHROPLASTY  02/03/2012   Procedure: TOTAL SHOULDER ARTHROPLASTY;  Surgeon: [name], MD;  Location: [facility];  Service: Orthopedics;  Laterality: Left;    REVIEW OF SYSTEMS:  A comprehensive review of systems was negative except for: Constitutional: positive for fatigue Neurological: positive for dizziness and paresthesia   PHYSICAL EXAMINATION: General appearance: alert, cooperative, fatigued and no distress Head: Normocephalic, without obvious abnormality, atraumatic Neck: no adenopathy, no JVD, supple, symmetrical, trachea midline and thyroid not enlarged, symmetric, no tenderness/mass/nodules Lymph nodes: Cervical, supraclavicular, and axillary nodes normal. Resp: clear to auscultation bilaterally Back: symmetric, no curvature. ROM normal. No CVA tenderness. Cardio: regular rate and  rhythm, S1, S2 normal, no murmur, click, rub or gallop GI: soft, non-tender; bowel sounds normal; no masses,  no organomegaly Extremities: extremities normal, atraumatic, no cyanosis or edema  ECOG PERFORMANCE STATUS: 1 - Symptomatic but completely ambulatory  Blood pressure 131/67, pulse 81, temperature 98.2 F (36.8 C), temperature source Oral, resp. rate 18, height 5' 11"  (1.803 m), weight 141 lb 12.8 oz (64.3 kg), SpO2 98 %.  LABORATORY DATA: Lab Results  Component Value Date   WBC 4.6 08/06/2019   HGB 13.9 08/06/2019   HCT 40.0 08/06/2019   MCV 89.9 08/06/2019   PLT 205 08/06/2019      Chemistry      Component Value Date/Time   NA 133 (L) 07/09/2019 1016   NA 132 (L) 12/06/2017 0930   K 4.4 07/09/2019 1016   K 4.0 12/06/2017 0930   CL 98 07/09/2019 1016   CO2 26 07/09/2019 1016   CO2 26 12/06/2017 0930   BUN 21 07/09/2019 1016   BUN 15.4 12/06/2017 0930   CREATININE 0.77 07/09/2019 1016    CREATININE 0.9 12/06/2017 0930      Component Value Date/Time   CALCIUM 9.5 07/09/2019 1016   CALCIUM 9.9 12/06/2017 0930   ALKPHOS 101 07/09/2019 1016   ALKPHOS 107 12/06/2017 0930   AST 16 07/09/2019 1016   AST 20 12/06/2017 0930   ALT 14 07/09/2019 1016   ALT 14 12/06/2017 0930   BILITOT 0.7 07/09/2019 1016   BILITOT 0.76 12/06/2017 0930       RADIOGRAPHIC STUDIES: No results found.  ASSESSMENT AND PLAN:  This is a very pleasant 78 years old white male with metastatic non-small cell lung cancer, adenocarcinoma and currently on treatment with second line immunotherapy with Nivolumab status post 60 cycles and has been tolerating his treatment well. He was recently switched to Nivolumab 480 MG IV every 4 weeks status post 26 cycles. He has been tolerating this treatment well with no concerning adverse effects. I recommended for him to proceed with cycle #27 today as planned. I will see him back for follow-up visit in 4 weeks for evaluation before cycle #28 with repeat CT scan of the chest, abdomen and pelvis for restaging of his disease. For the peripheral neuropathy he will resume his treatment with amitriptyline. For the hyponatremia he will continue his current treatment with demeclocycline but I advised him to take 2 tablets in the morning and 1 tablet in the evening for at least 1 week. For pain management I will change his pain medication to tramadol nstead of Norco based on his request. For the anxiety, he will continue on Xanax on a as needed basis. The patient was advised to call immediately if he has any concerning symptoms in the interval. The patient voices understanding of current disease status and treatment options and is in agreement with the current care plan. All questions were answered. The patient knows to call the clinic with any problems, questions or concerns. We can certainly see the patient much sooner if necessary.  Disclaimer: This note was dictated with  voice recognition software. Similar sounding words can inadvertently be transcribed and may not be corrected upon review.

## 2019-08-06 NOTE — Patient Instructions (Signed)
[facility] Discharge Instructions for Patients Receiving Chemotherapy  Today you received the following chemotherapy agents Nivolumab (OPDIVO).  To help prevent nausea and vomiting after your treatment, we encourage you to take your nausea medication as prescribed.   If you develop nausea and vomiting that is not controlled by your nausea medication, call the clinic.   BELOW ARE SYMPTOMS THAT SHOULD BE REPORTED IMMEDIATELY:  *FEVER GREATER THAN 100.5 F  *CHILLS WITH OR WITHOUT FEVER  NAUSEA AND VOMITING THAT IS NOT CONTROLLED WITH YOUR NAUSEA MEDICATION  *UNUSUAL SHORTNESS OF BREATH  *UNUSUAL BRUISING OR BLEEDING  TENDERNESS IN MOUTH AND THROAT WITH OR WITHOUT PRESENCE OF ULCERS  *URINARY PROBLEMS  *BOWEL PROBLEMS  UNUSUAL RASH Items with * indicate a potential emergency and should be followed up as soon as possible.  Feel free to call the clinic should you have any questions or concerns. The clinic phone number is [phone].  Please show the [place] at check-in to the Emergency Department and triage nurse.  Coronavirus (COVID-19) Are you at risk?  Are you at risk for the Coronavirus (COVID-19)?  To be considered HIGH RISK for Coronavirus (COVID-19), you have to meet the following criteria:  . Traveled to [place], [place], [place], [place] or [place]; or in the [place] to [place], [place], [place], or [place]; and have fever, cough, and shortness of breath within the last 2 weeks of travel OR . Been in close contact with a person diagnosed with COVID-19 within the last 2 weeks and have fever, cough, and shortness of breath . IF YOU DO NOT MEET THESE CRITERIA, YOU ARE CONSIDERED LOW RISK FOR COVID-19.  What to do if you are HIGH RISK for COVID-19?  [name] If you are having a medical emergency, call 911. . Seek medical care right away. Before you go to a doctor's office, urgent care or emergency department, call ahead and tell them  about your recent travel, contact with someone diagnosed with COVID-19, and your symptoms. You should receive instructions from your physician's office regarding next steps of care.  . When you arrive at healthcare provider, tell the healthcare staff immediately you have returned from visiting [place], [place], [place], [place] or [place]; or traveled in the [place] to [place], [place], [place], or [place]; in the last two weeks or you have been in close contact with a person diagnosed with COVID-19 in the last 2 weeks.   . Tell the health care staff about your symptoms: fever, cough and shortness of breath. . After you have been seen by a medical provider, you will be either: o Tested for (COVID-19) and discharged home on quarantine except to seek medical care if symptoms worsen, and asked to  - Stay home and avoid contact with others until you get your results (4-5 days)  - Avoid travel on public transportation if possible (such as bus, train, or airplane) or o Sent to the Emergency Department by EMS for evaluation, COVID-19 testing, and possible admission depending on your condition and test results.  What to do if you are LOW RISK for COVID-19?  Reduce your risk of any infection by using the same precautions used for avoiding the common cold or flu:  [name] Wash your hands often with soap and warm water for at least 20 seconds.  If soap and water are not readily available, use an alcohol-based hand sanitizer with at least 60% alcohol.  . If coughing or  sneezing, cover your mouth and nose by coughing or sneezing into the elbow areas of your shirt or coat, into a tissue or into your sleeve (not your hands). . Avoid shaking hands with others and consider head nods or verbal greetings only. . Avoid touching your eyes, nose, or mouth with unwashed hands.  . Avoid close contact with people who are sick. . Avoid places or events with large numbers of people in one location, like concerts or  sporting events. . Carefully consider travel plans you have or are making. . If you are planning any travel outside or inside the [place], visit the CDC's Travelers' Health webpage for the latest health notices. . If you have some symptoms but not all symptoms, continue to monitor at home and seek medical attention if your symptoms worsen. . If you are having a medical emergency, call 911.   [place] / e-Visit: [email or web address]         [facility]: [facility]: [phone]                   [facility]: [phone]

## 2019-08-09 ENCOUNTER — Other Ambulatory Visit: Payer: Self-pay

## 2019-08-09 ENCOUNTER — Ambulatory Visit (INDEPENDENT_AMBULATORY_CARE_PROVIDER_SITE_OTHER): Payer: Medicare Other | Admitting: Psychiatry

## 2019-08-09 DIAGNOSIS — F4323 Adjustment disorder with mixed anxiety and depressed mood: Secondary | ICD-10-CM

## 2019-08-09 DIAGNOSIS — G620 Drug-induced polyneuropathy: Secondary | ICD-10-CM | POA: Diagnosis not present

## 2019-08-09 DIAGNOSIS — C3431 Malignant neoplasm of lower lobe, right bronchus or lung: Secondary | ICD-10-CM

## 2019-08-09 DIAGNOSIS — F1021 Alcohol dependence, in remission: Secondary | ICD-10-CM

## 2019-08-09 NOTE — Progress Notes (Signed)
Psychotherapy Progress Note [facility] Luan Moore, PhD LP  Patient ID: Anthony Curry.     MRN: [id number]     Therapy format: Individual psychotherapy Date: 08/09/2019     Start: 3:15p Stop: 4:02p Time Spent: 47 min Location: in-person   Session narrative (presenting needs, interim history, self-report of stressors and symptoms, applications of prior therapy, status changes, and interventions made in session) Aggravated by worry over his quarterly CT scan, and neuropathy, which is a chronic bother not being able to feel his feet well.  Abrupt change of subject wants to know what we're going to do about the problem -- tried to get clarification of immediate interest, reviewed main concerns from first visit to check whether still his interest, discussed anxiety as understood then.  Interpreted double hospitalization and the impression of unreliable care as the big stimulus to feel his old anxiety unleashed, perhaps about just feeling too much like the old period of uncertainty and self-doubt he went through, even though it had to do with other things.  Also the raw vulnerability, perhaps, of needing so much medical care and having to be at the mercy of others' competencies.  Change of subject to family concern.  Family want to take him to [place] (where he met wife, in the service) for his 78th birthday.  Finds himself Chiropodist.  Last time in [place] he fell, fx'd wrist, and felt he was at risk of poor care.  Not enthusiastic about the idea of risking again.  Encouraged to talk it over, get clearer layout of advantages and disadvantages together rather than react.  Re. recommendation to compromise with wife and go out a bit more, says they have gotten out to eat a couple times.    Hx offered, lost son Wille Glaser to alcoholism 10 years ago.  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    Change of subject to sleep -- wakes up feeling worse, tired.  Has been  cutting TV off earlier, listening to music helps get to sleep, but still restless.  Worries on the way to sleep whether he is going to be up.  Coached to adjust attitude about sleep -- accept the rest when sleep does not just come.  Says he used to be normal a 5-6 hrs, now 7.  Advised set aside 8 hrs if possible.    Re. other worry scenarios, advised try to try to imagine them successfully working out, in any way possible.  Option to write them out to slow thoughts.  Therapeutic modalities: Cognitive Behavioral Therapy  Mental Status/Observations:  Appearance:   Casual     Behavior:  Appropriate  Motor:  walker  Speech/Language:   some pressure  Affect:  Appropriate and restless  Mood:  anxious and dysthymic  Thought process:  pressure of subject  Thought content:    multiple concenrs/worries  Sensory/Perceptual disturbances:    WNL  Orientation:  grossly intact  Attention:  Good  Concentration:  Fair  Memory:  grossly intact  Insight:    Fair  Judgment:   Good  Impulse Control:  Fair   Risk Assessment: Danger to Self: No Self-injurious Behavior: No Danger to Others: No Physical Aggression / Violence: No Duty to Warn: No Access to Firearms a concern: No  Assessment of progress:  stabilized  Diagnosis:   ICD-10-CM   1. Adjustment disorder with mixed anxiety and depressed mood  F43.23   2. 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 Malignant neoplasm of lower lobe of right lung ([facility])  C34.31    Plan:  . Re. sleep readiness -- OTC melatonin 1 hr before bed, start with 7m, titrate as desired.  Try to get TV off a half hour ahead of bed.  If concerned about whether he will sleep, commit to accept rest, even if conscious a while. .Marland KitchenRe. worry, try writing out what worrying about, just to slow down  . Keep going out now and then with wife for goodwill and change of scenery . Option discuss the [place], get  pros and cons on the table for himself and others before deciding . Other recommendations/advice as noted above . Continue to utilize previously learned skills ad lib . Maintain medication as prescribed and work faithfully with relevant prescriber(s) if any changes are desired or seem indicated . Call the clinic on-call service, present to ER, or call 911 if any life-threatening psychiatric crisis Return in about 2 weeks (around 08/23/2019).  RBlanchie Serve PhD ALuan Moore PhD LP Clinical Psychologist, CFrancisc[facility] St [facility] [address] [place] [phone]

## 2019-08-13 DIAGNOSIS — G629 Polyneuropathy, unspecified: Secondary | ICD-10-CM | POA: Diagnosis not present

## 2019-08-13 DIAGNOSIS — I4819 Other persistent atrial fibrillation: Secondary | ICD-10-CM | POA: Diagnosis not present

## 2019-08-13 DIAGNOSIS — I10 Essential (primary) hypertension: Secondary | ICD-10-CM | POA: Diagnosis not present

## 2019-08-13 DIAGNOSIS — C7970 Secondary malignant neoplasm of unspecified adrenal gland: Secondary | ICD-10-CM | POA: Diagnosis not present

## 2019-08-13 DIAGNOSIS — C3431 Malignant neoplasm of lower lobe, right bronchus or lung: Secondary | ICD-10-CM | POA: Diagnosis not present

## 2019-08-13 DIAGNOSIS — D630 Anemia in neoplastic disease: Secondary | ICD-10-CM | POA: Diagnosis not present

## 2019-08-14 ENCOUNTER — Ambulatory Visit: Payer: BLUE CROSS/BLUE SHIELD | Admitting: Mental Health

## 2019-08-26 DIAGNOSIS — Z23 Encounter for immunization: Secondary | ICD-10-CM | POA: Diagnosis not present

## 2019-08-26 DIAGNOSIS — N410 Acute prostatitis: Secondary | ICD-10-CM | POA: Diagnosis not present

## 2019-08-26 DIAGNOSIS — N401 Enlarged prostate with lower urinary tract symptoms: Secondary | ICD-10-CM | POA: Diagnosis not present

## 2019-08-26 DIAGNOSIS — R3914 Feeling of incomplete bladder emptying: Secondary | ICD-10-CM | POA: Diagnosis not present

## 2019-08-28 ENCOUNTER — Ambulatory Visit: Payer: Medicare Other | Admitting: Psychiatry

## 2019-09-02 ENCOUNTER — Other Ambulatory Visit: Payer: Self-pay

## 2019-09-02 ENCOUNTER — Encounter (HOSPITAL_COMMUNITY): Payer: Self-pay

## 2019-09-02 ENCOUNTER — Ambulatory Visit (HOSPITAL_COMMUNITY)
Admission: RE | Admit: 2019-09-02 | Discharge: 2019-09-02 | Disposition: A | Discharge status: Home / Self Care | Payer: Medicare Other | Source: Ambulatory Visit | Attending: Internal Medicine | Admitting: Internal Medicine

## 2019-09-02 DIAGNOSIS — C3490 Malignant neoplasm of unspecified part of unspecified bronchus or lung: Secondary | ICD-10-CM | POA: Diagnosis not present

## 2019-09-02 DIAGNOSIS — D3501 Benign neoplasm of right adrenal gland: Secondary | ICD-10-CM | POA: Diagnosis not present

## 2019-09-02 MED ORDER — IOHEXOL 300 MG/ML  SOLN
75.0000 mL | Freq: Once | INTRAMUSCULAR | Status: AC | PRN
  Administered 2019-09-02: 75 mL via INTRAVENOUS

## 2019-09-02 MED ORDER — SODIUM CHLORIDE (PF) 0.9 % IJ SOLN
INTRAMUSCULAR | Status: AC
  Filled 2019-09-02: qty 50

## 2019-09-03 ENCOUNTER — Inpatient Hospital Stay (HOSPITAL_BASED_OUTPATIENT_CLINIC_OR_DEPARTMENT_OTHER): Payer: Medicare Other | Admitting: Internal Medicine

## 2019-09-03 ENCOUNTER — Other Ambulatory Visit: Payer: Self-pay

## 2019-09-03 ENCOUNTER — Inpatient Hospital Stay: Payer: Medicare Other

## 2019-09-03 ENCOUNTER — Telehealth: Payer: Self-pay | Admitting: Internal Medicine

## 2019-09-03 ENCOUNTER — Encounter: Payer: Self-pay | Admitting: Internal Medicine

## 2019-09-03 VITALS — BP 156/74 | HR 94 | Temp 98.2°F | Resp 18 | Ht 71.0 in | Wt 142.0 lb

## 2019-09-03 DIAGNOSIS — C3431 Malignant neoplasm of lower lobe, right bronchus or lung: Secondary | ICD-10-CM

## 2019-09-03 DIAGNOSIS — R5382 Chronic fatigue, unspecified: Secondary | ICD-10-CM

## 2019-09-03 DIAGNOSIS — I10 Essential (primary) hypertension: Secondary | ICD-10-CM

## 2019-09-03 DIAGNOSIS — Z5112 Encounter for antineoplastic immunotherapy: Secondary | ICD-10-CM | POA: Diagnosis not present

## 2019-09-03 DIAGNOSIS — Z79899 Other long term (current) drug therapy: Secondary | ICD-10-CM | POA: Diagnosis not present

## 2019-09-03 LAB — CMP (CANCER CENTER ONLY)
ALT: 11 U/L (ref 0–44)
AST: 18 U/L (ref 15–41)
Albumin: 3.7 g/dL (ref 3.5–5.0)
Alkaline Phosphatase: 119 U/L (ref 38–126)
Anion gap: 9 (ref 5–15)
BUN: 11 mg/dL (ref 8–23)
CO2: 24 mmol/L (ref 22–32)
Calcium: 9.3 mg/dL (ref 8.9–10.3)
Chloride: 101 mmol/L (ref 98–111)
Creatinine: 0.78 mg/dL (ref 0.61–1.24)
GFR, Est AFR Am: >60 mL/min (ref >60)
GFR, Estimated: >60 mL/min (ref >60)
Glucose, Bld: 100 mg/dL — ABNORMAL HIGH (ref 70–99)
Potassium: 4 mmol/L (ref 3.5–5.1)
Sodium: 134 mmol/L — ABNORMAL LOW (ref 135–145)
Total Bilirubin: 0.6 mg/dL (ref 0.3–1.2)
Total Protein: 6.1 g/dL — ABNORMAL LOW (ref 6.5–8.1)

## 2019-09-03 LAB — CBC WITH DIFFERENTIAL (CANCER CENTER ONLY)
Abs Immature Granulocytes: 0.02 10*3/uL (ref 0.00–0.07)
Basophils Absolute: 0 10*3/uL (ref 0.0–0.1)
Basophils Relative: 1 %
Eosinophils Absolute: 0.1 10*3/uL (ref 0.0–0.5)
Eosinophils Relative: 2 %
HCT: 41.9 % (ref 39.0–52.0)
Hemoglobin: 14.2 g/dL (ref 13.0–17.0)
Immature Granulocytes: 0 %
Lymphocytes Relative: 19 %
Lymphs Abs: 0.9 10*3/uL (ref 0.7–4.0)
MCH: 31.1 pg (ref 26.0–34.0)
MCHC: 33.9 g/dL (ref 30.0–36.0)
MCV: 91.9 fL (ref 80.0–100.0)
Monocytes Absolute: 0.5 10*3/uL (ref 0.1–1.0)
Monocytes Relative: 11 %
Neutro Abs: 3.2 10*3/uL (ref 1.7–7.7)
Neutrophils Relative %: 67 %
Platelet Count: 247 10*3/uL (ref 150–400)
RBC: 4.56 MIL/uL (ref 4.22–5.81)
RDW: 14.3 % (ref 11.5–15.5)
WBC Count: 4.8 10*3/uL (ref 4.0–10.5)
nRBC: 0 % (ref 0.0–0.2)

## 2019-09-03 LAB — TSH: TSH: 2.278 u[IU]/mL (ref 0.320–4.118)

## 2019-09-03 MED ORDER — SODIUM CHLORIDE 0.9 % IV SOLN
Freq: Once | INTRAVENOUS | Status: AC
  Administered 2019-09-03: 13:00:00 via INTRAVENOUS
  Filled 2019-09-03: qty 250

## 2019-09-03 MED ORDER — MIRTAZAPINE 30 MG PO TABS: 30.0000 mg | ORAL_TABLET | Freq: Every day | ORAL | 2 refills | Status: DC

## 2019-09-03 MED ORDER — SODIUM CHLORIDE 0.9 % IV SOLN
480.0000 mg | Freq: Once | INTRAVENOUS | Status: AC
  Administered 2019-09-03: 14:00:00 480 mg via INTRAVENOUS
  Filled 2019-09-03: qty 48

## 2019-09-03 MED ORDER — HYDROCODONE-ACETAMINOPHEN 5-325 MG PO TABS: 1.0000 | ORAL_TABLET | Freq: Three times a day (TID) | ORAL | 0 refills | Status: DC | PRN

## 2019-09-03 NOTE — Patient Instructions (Signed)
[facility] Discharge Instructions for Patients Receiving Chemotherapy  Today you received the following chemotherapy agents: Nivolumab  To help prevent nausea and vomiting after your treatment, we encourage you to take your nausea medication as directed.    If you develop nausea and vomiting that is not controlled by your nausea medication, call the clinic.   BELOW ARE SYMPTOMS THAT SHOULD BE REPORTED IMMEDIATELY:  *FEVER GREATER THAN 100.5 F  *CHILLS WITH OR WITHOUT FEVER  NAUSEA AND VOMITING THAT IS NOT CONTROLLED WITH YOUR NAUSEA MEDICATION  *UNUSUAL SHORTNESS OF BREATH  *UNUSUAL BRUISING OR BLEEDING  TENDERNESS IN MOUTH AND THROAT WITH OR WITHOUT PRESENCE OF ULCERS  *URINARY PROBLEMS  *BOWEL PROBLEMS  UNUSUAL RASH Items with * indicate a potential emergency and should be followed up as soon as possible.  Feel free to call the clinic should you have any questions or concerns. The clinic phone number is [phone].  Please show the CHEMO ALERT CARD at check-in to the Emergency Department and triage nurse.   

## 2019-09-03 NOTE — Progress Notes (Signed)
[place] [phone]   [phone]  OFFICE PROGRESS NOTE  [name], MD [place] [address]  DIAGNOSIS: Stage IV (T1a, N0, M1b) non-small cell lung cancer, adenocarcinoma of the right lower lobe diagnosed in October 2014  Molecular profile: Negative forRET, ALK, BRAF, MET, EGFR.  Positive for ERBB2 A775T, QMG5O037C, KRAS G13E, MAP2K1 D67N (see full report).   PRIOR THERAPY:  1) Systemic chemotherapy with carboplatin for AUC of 5 and Alimta 500 mg/M2 every 3 weeks, status post 6 cycles with stable disease. First dose on 10/23/2013. 2) Maintenance systemic chemotherapy with single agent Alimta 500 mg/M2 every 3 weeks. Status post 8 cycles.  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 Sondra Come completed on 12/16/2014. 4) stereotactic radiotherapy to the enlarging right upper lobe lung nodule under the care of Dr. Sondra Come completed on 11/22/2016. 5) Immunotherapy with Nivolumab 240 mg IV every 2 weeks, status post 60 cycles. First cycle was given on 01/28/2015. Last dose 07/05/2017.  CURRENT THERAPY: Immunotherapy with Nivolumab 480 mg IV every 4 weeks. First dose 07/19/2017. Status post 27 cycles.  INTERVAL HISTORY: Anthony Curry. 78 y.o. male returns to the clinic today for follow-up visit.  The patient is feeling fine today with no concerning complaints except for the persistent fatigue and occasional dizzy spells.  He also has peripheral neuropathy.  He was tried recently on tramadol based on his request but it was not effective and he would like to go back to the hydrocodone.  He denied having any current chest pain, shortness of breath, cough or hemoptysis.  He denied having any fever or chills.  He has no nausea, vomiting, diarrhea or constipation.  He has no headache or visual changes.  He has been tolerating his treatment with nivolumab fairly well.  He is  currently on demeclocycline for hyponatremia.  The patient had repeat CT scan of the chest, abdomen pelvis performed recently and he is here for evaluation and discussion of his scan results.  MEDICAL HISTORY: Past Medical History:  Diagnosis Date  . 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[place])   . Adrenal mass ([place])   . Alcoholism in remission ([place])   . Anemia of chronic disease   . 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 Anxiety   . BPH (benign prostatic hyperplasia)   . Cancer ([place])    small cell/lung  . Complication of anesthesia 09/08/2015    JITTERY AFTER GALLBLADDER SURGERY  . GERD (gastroesophageal reflux disease)   . History of radiation therapy 11/14/16, 11/18/16, 11/22/16   Left upper lung: 54 Gy in 3 fractions  . Hypertension   . Hypertension 12/07/2016  . Neoplastic malignant related fatigue    IV tx. every 2 weeks last9-14-16 (Dr. Earlie Server), radiation last tx. 6 months  . 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 Persistent atrial fibrillation   . Pulmonary nodules   . Radiation 12/02/14-12/16/14   Left adrenal gland 30 Gy in 10 fractions    ALLERGIES:  has No Known Allergies.  MEDICATIONS:  Current Outpatient Medications  Medication Sig Dispense Refill  . ALPRAZolam (XANAX) 0.25 MG tablet TAKE 1 TABLET(0.25 MG) BY MOUTH AT BEDTIME AS NEEDED FOR ANXIETY 30 tablet 0  . apixaban (ELIQUIS) 5 MG TABS tablet Take 5 mg by mouth 2 (two) times daily.    . Ascorbic Acid (VITAMIN C PO) Take 1,000 mg by mouth daily.     [name]  B Complex-C (SUPER B COMPLEX PO) Take 1 each by mouth daily.     . calcium carbonate (OS-CAL) 600 MG TABS Take 600 mg by mouth daily with breakfast.    . demeclocycline (DECLOMYCIN) 150 MG tablet Take 2 tablets twice a day 120 tablet 2  . diltiazem (CARDIZEM CD) 180 MG 24 hr capsule Take 1 capsule (180 mg total) by mouth daily at 12 noon. 90 capsule 3  . gabapentin (NEURONTIN) 300 MG capsule Take 1 capsule (300 mg total) by mouth at bedtime. (Patient not  taking: Reported on 08/06/2019) 30 capsule 0  . KLOR-CON M20 20 MEQ tablet Take 20 mEq by mouth every morning.     . Lactobacillus (ACIDOPHILUS) CAPS capsule Take 1 capsule by mouth daily.    [name] levothyroxine (SYNTHROID, LEVOTHROID) 50 MCG tablet TAKE 1 TABLET (50 MCG TOTAL) BY MOUTH DAILY BEFORE BREAKFAST. 90 tablet 0  . MAGNESIUM PO Take 400 mg by mouth daily.     . Multiple Vitamin (MULTIVITAMIN WITH MINERALS) TABS Take 1 tablet by mouth daily.    . Nivolumab (OPDIVO IV) Inject as directed every 2 weeks    . Nutritional Supplements (ENSURE COMPLETE PO) Take 1 Can by mouth 2 (two) times daily.    . Omega-3 Fatty Acids (FISH OIL PO) Take 1 tablet by mouth daily.    . tamsulosin (FLOMAX) 0.4 MG CAPS capsule Take 0.4 mg by mouth daily at 12 noon.     . thiamine (VITAMIN B-1) 100 MG tablet Take 1 tablet (100 mg total) by mouth daily. 30 tablet 6  . traMADol (ULTRAM) 50 MG tablet Take 1 tablet (50 mg total) by mouth 3 (three) times daily as needed. 90 tablet 0  . vitamin E 400 UNIT capsule Take 400 Units by mouth daily.     No current facility-administered medications for this visit.     SURGICAL HISTORY:  Past Surgical History:  Procedure Laterality Date  . ANKLE ARTHRODESIS  1995   rt fx-hardware in  . CARDIOVERSION N/A 09/29/2014   Procedure: CARDIOVERSION;  Surgeon: Josue Hector, MD;  Location[facility] ENDOSCOPY;  Service: Cardiovascular;  Laterality: N/A;  . CHOLECYSTECTOMY  09/08/2015    laproscopic   . CHOLECYSTECTOMY N/A 09/08/2015   Procedure: LAPAROSCOPIC CHOLECYSTECTOMY WITH ATTEMPTED INTRAOPERATIVE CHOLANGIOGRAM ;  Surgeon: Fanny Skates, MD;  Location: [place];  Service: General;  Laterality: N/A;  . COLONOSCOPY    . ERCP N/A 09/02/2015   Procedure: ENDOSCOPIC RETROGRADE CHOLANGIOPANCREATOGRAPHY (ERCP);  Surgeon: Arta Silence, MD;  Location: [facility] ENDOSCOPY;  Service: Endoscopy;  Laterality: N/A;  . ERCP N/A 09/07/2015   Procedure: ENDOSCOPIC RETROGRADE CHOLANGIOPANCREATOGRAPHY (ERCP);   Surgeon: Clarene Essex, MD;  Location: [facility] ENDOSCOPY;  Service: Endoscopy;  Laterality: N/A;  . EXCISION / CURETTAGE BONE CYST FINGER  2005   rt thumb  . HEMORRHOID SURGERY    . Percutaneous Cholecytostomy tube     IVR insertion 06-13-15(Dr. Vernard Gambles)- 08-28-15 remains in place to drainage bag  . TONSILLECTOMY    . TOTAL SHOULDER ARTHROPLASTY  02/03/2012   Procedure: TOTAL SHOULDER ARTHROPLASTY;  Surgeon: [name], MD;  Location: [place];  Service: Orthopedics;  Laterality: Left;    REVIEW OF SYSTEMS:  Constitutional: positive for fatigue Eyes: negative Ears, nose, mouth, throat, and face: negative Respiratory: negative Cardiovascular: negative Gastrointestinal: negative Genitourinary:negative Integument/breast: negative Hematologic/lymphatic: negative Musculoskeletal:positive for arthralgias Neurological: positive for dizziness and paresthesia Behavioral/Psych: positive for depression Endocrine: negative Allergic/Immunologic: negative   PHYSICAL EXAMINATION: General appearance: alert, cooperative,  fatigued and no distress Head: Normocephalic, without obvious abnormality, atraumatic Neck: no adenopathy, no JVD, supple, symmetrical, trachea midline and thyroid not enlarged, symmetric, no tenderness/mass/nodules Lymph nodes: Cervical, supraclavicular, and axillary nodes normal. Resp: clear to auscultation bilaterally Back: symmetric, no curvature. ROM normal. No CVA tenderness. Cardio: regular rate and rhythm, S1, S2 normal, no murmur, click, rub or gallop GI: soft, non-tender; bowel sounds normal; no masses,  no organomegaly Extremities: extremities normal, atraumatic, no cyanosis or edema Neurologic: Alert and oriented X 3, normal strength and tone. Normal symmetric reflexes. Normal coordination and gait  ECOG PERFORMANCE STATUS: 1 - Symptomatic but completely ambulatory  Blood pressure (!) 156/74, pulse 94, temperature 98.2 F (36.8 C), temperature source  Oral, resp. rate 18, height _0  (1.803 m), weight 142 lb (64.4 kg), SpO2 100 %.  LABORATORY DATA: Lab Results  Component Value Date   WBC 4.8 09/03/2019   HGB 14.2 09/03/2019   HCT 41.9 09/03/2019   MCV 91.9 09/03/2019   PLT 247 09/03/2019      Chemistry      Component Value Date/Time   NA 132 (L) 08/06/2019 0937   NA 132 (L) 12/06/2017 0930   K 4.3 08/06/2019 0937   K 4.0 12/06/2017 0930   CL 99 08/06/2019 0937   CO2 25 08/06/2019 0937   CO2 26 12/06/2017 0930   BUN 22 08/06/2019 0937   BUN 15.4 12/06/2017 0930   CREATININE 0.83 08/06/2019 0937   CREATININE 0.9 12/06/2017 0930      Component Value Date/Time   CALCIUM 9.0 08/06/2019 0937   CALCIUM 9.9 12/06/2017 0930   ALKPHOS 113 08/06/2019 0937   ALKPHOS 107 12/06/2017 0930   AST 17 08/06/2019 0937   AST 20 12/06/2017 0930   ALT 13 08/06/2019 0937   ALT 14 12/06/2017 0930   BILITOT 0.6 08/06/2019 0937   BILITOT 0.76 12/06/2017 0930       RADIOGRAPHIC STUDIES: Ct Chest W Contrast  Result Date: 09/02/2019 CLINICAL DATA:  Lung cancer. Chemotherapy and radiation therapy complete. Ongoing immunotherapy. EXAM: CT CHEST, ABDOMEN, AND PELVIS WITH CONTRAST TECHNIQUE: Multidetector CT imaging of the chest, abdomen and pelvis was performed following the standard protocol during bolus administration of intravenous contrast. CONTRAST:  109m OMNIPAQUE IOHEXOL 300 MG/ML  SOLN COMPARISON:  05/13/2019 and 08/29/2016. FINDINGS: CT CHEST FINDINGS Cardiovascular: Atherosclerotic calcification of the aorta and coronary arteries. Heart size normal. Small to moderate pericardial effusion, slightly increased. Mediastinum/Nodes: No pathologically enlarged mediastinal, hilar or axillary lymph nodes. Esophagus is unremarkable. Lungs/Pleura: Centrilobular and paraseptal emphysema with bullous components in the apices. There are multiple areas of nodular/masslike ground-glass and sub solid lesions bilaterally, similar to 05/13/2019 but  progressive from 08/29/2016. For example, a ground-glass mass in the apical segment right upper lobe measures 2.5 x 3.1 cm (4/28), stable from the most recent prior examination but increased in size from 08/29/2016, at which time it measured 1.7 x 1.8 cm. Sub solid nodule located slightly more inferiorly within the apical segment right upper lobe measures 2.3 x 2.9 cm (4/56), previously 2.4 x 2.4 cm on 08/29/2016. Its internal solid component measures 8 mm (4/49) and is new. 1.4 x 1.5 cm nodule in the right lower lobe (4/89) appears more solid than on 05/13/2019 and has surrounding architectural distortion. Bandlike scarring in the apical left upper lobe. Scattered pulmonary parenchymal scarring. Trace bilateral pleural fluid bilaterally. Calcification is seen in the left pleural space, posteriorly. Airway is unremarkable. Musculoskeletal: Old rib fractures. Extensive degenerative changes in  the spine and right shoulder. Left shoulder arthroplasty. No worrisome lytic or sclerotic lesions. CT ABDOMEN PELVIS FINDINGS Hepatobiliary: Low-attenuation lesions in the liver measure up to 1.5 cm in the inferior right hepatic lobe, as before. Cholecystectomy. No biliary ductal dilatation. 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 Pancreas: Negative. Spleen: Negative. Adrenals/Urinary Tract: Low-attenuation right adrenal nodule measures 1.7 cm and has a relative contrast washout of 76%. Left adrenal gland is unremarkable. Scarring in the right kidney. Low-attenuation lesions in the kidneys are subcentimeter in size and too small to characterize. Ureters are decompressed. Extensive bladder trabeculation and diverticula. Stomach/Bowel: Stomach, small bowel, appendix and colon are unremarkable. Vascular/Lymphatic: Atherosclerotic calcification of the aorta without aneurysm. No pathologically enlarged lymph nodes. Reproductive: Prostate is normal in size. Other: No free fluid.  Mesenteries and peritoneum are  unremarkable. Musculoskeletal: Degenerative changes in the spine. No worrisome lytic or sclerotic lesions. IMPRESSION: 1. Multiple ground-glass or sub solid lesions in the lungs bilaterally, stable from 05/13/2019, but progressive from 08/29/2016, as detailed above. Findings are most indicative of multifocal adenocarcinoma. 2. Small to moderate pericardial fluid, slightly increased. 3. Right adrenal adenoma. 4. Aortic atherosclerosis (ICD10-170.0). Coronary artery calcification. 5.  Emphysema (ICD10-J43.9). Electronically Signed   By: Lorin Picket M.D.   On: 09/02/2019 09:18   Ct Abdomen Pelvis W Contrast  Result Date: 09/02/2019 CLINICAL DATA:  Lung cancer. Chemotherapy and radiation therapy complete. Ongoing immunotherapy. EXAM: CT CHEST, ABDOMEN, AND PELVIS WITH CONTRAST TECHNIQUE: Multidetector CT imaging of the chest, abdomen and pelvis was performed following the standard protocol during bolus administration of intravenous contrast. CONTRAST:  39m OMNIPAQUE IOHEXOL 300 MG/ML  SOLN COMPARISON:  05/13/2019 and 08/29/2016. FINDINGS: CT CHEST FINDINGS Cardiovascular: Atherosclerotic calcification of the aorta and coronary arteries. Heart size normal. Small to moderate pericardial effusion, slightly increased. Mediastinum/Nodes: No pathologically enlarged mediastinal, hilar or axillary lymph nodes. Esophagus is unremarkable. Lungs/Pleura: Centrilobular and paraseptal emphysema with bullous components in the apices. There are multiple areas of nodular/masslike ground-glass and sub solid lesions bilaterally, similar to 05/13/2019 but progressive from 08/29/2016. For example, a ground-glass mass in the apical segment right upper lobe measures 2.5 x 3.1 cm (4/28), stable from the most recent prior examination but increased in size from 08/29/2016, at which time it measured 1.7 x 1.8 cm. Sub solid nodule located slightly more inferiorly within the apical segment right upper lobe measures 2.3 x 2.9 cm (4/56),  previously 2.4 x 2.4 cm on 08/29/2016. Its internal solid component measures 8 mm (4/49) and is new. 1.4 x 1.5 cm nodule in the right lower lobe (4/89) appears more solid than on 05/13/2019 and has surrounding architectural distortion. Bandlike scarring in the apical left upper lobe. Scattered pulmonary parenchymal scarring. Trace bilateral pleural fluid bilaterally. Calcification is seen in the left pleural space, posteriorly. Airway is unremarkable. Musculoskeletal: Old rib fractures. Extensive degenerative changes in the spine and right shoulder. Left shoulder arthroplasty. No worrisome lytic or sclerotic lesions. CT ABDOMEN PELVIS FINDINGS Hepatobiliary: Low-attenuation lesions in the liver measure up to 1.5 cm in the inferior right hepatic lobe, as before. Cholecystectomy. No biliary ductal dilatation. 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 Pancreas: Negative. Spleen: Negative. Adrenals/Urinary Tract: Low-attenuation right adrenal nodule measures 1.7 cm and has a relative contrast washout of 76%. Left adrenal gland is unremarkable. Scarring in the right kidney. Low-attenuation lesions in the kidneys are subcentimeter in size and too small to characterize. Ureters are decompressed. Extensive bladder trabeculation and diverticula. Stomach/Bowel: Stomach, small bowel, appendix and  colon are unremarkable. Vascular/Lymphatic: Atherosclerotic calcification of the aorta without aneurysm. No pathologically enlarged lymph nodes. Reproductive: Prostate is normal in size. Other: No free fluid.  Mesenteries and peritoneum are unremarkable. Musculoskeletal: Degenerative changes in the spine. No worrisome lytic or sclerotic lesions. IMPRESSION: 1. Multiple ground-glass or sub solid lesions in the lungs bilaterally, stable from 05/13/2019, but progressive from 08/29/2016, as detailed above. Findings are most indicative of multifocal adenocarcinoma. 2. Small to moderate pericardial fluid, slightly  increased. 3. Right adrenal adenoma. 4. Aortic atherosclerosis (ICD10-170.0). Coronary artery calcification. 5.  Emphysema (ICD10-J43.9). Electronically Signed   By: Lorin Picket M.D.   On: 09/02/2019 09:18    ASSESSMENT AND PLAN:  This is a very pleasant 78 years old white male with metastatic non-small cell lung cancer, adenocarcinoma and currently on treatment with second line immunotherapy with Nivolumab status post 60 cycles and has been tolerating his treatment well. He was recently switched to Nivolumab 480 MG IV every 4 weeks status post 27 cycles. The patient has been tolerating his treatment well with no concerning adverse effects. He had repeat CT scan of the chest, abdomen pelvis performed recently.  The scan showed the multiple ground glass and sub-solid lesions that has been going on for reasonable amount of time but no significant change since his scan in June 2020. I personally and independently reviewed the scan images and discussed the results with the patient today.  I recommended for him to continue his current treatment with immunotherapy and he will proceed with cycle #28 today as planned. For the depression and lack of appetite, I will start the patient on Remeron 30 mg p.o. nightly. For the hyponatremia he will continue his current treatment with demeclocycline but I advised him to take 2 tablets in the morning and 1 tablet in the evening for at least 1 week. For pain management I I changed his treatment again to hydrocodone 5/325 every 8 hours as needed for pain.  He tried tramadol and it was not effective. For the anxiety, he will continue on Xanax on a as needed basis. For the peripheral neuropathy and dizzy spells, he is scheduled to see neurology in the next few weeks. The patient will come back for follow-up visit in 4 weeks for evaluation before the next cycle of his treatment. He was advised to call immediately if he has any concerning symptoms in the interval. The  patient voices understanding of current disease status and treatment options and is in agreement with the current care plan. All questions were answered. The patient knows to call the clinic with any problems, questions or concerns. We can certainly see the patient much sooner if necessary.  Disclaimer: This note was dictated with voice recognition software. Similar sounding words can inadvertently be transcribed and may not be corrected upon review.

## 2019-09-03 NOTE — Telephone Encounter (Signed)
Scheduled appt per 9/29 los - pt to get an updated schedule next visit.   

## 2019-09-12 ENCOUNTER — Other Ambulatory Visit: Payer: Self-pay

## 2019-09-12 ENCOUNTER — Encounter: Payer: Self-pay | Admitting: Adult Health

## 2019-09-12 ENCOUNTER — Ambulatory Visit (INDEPENDENT_AMBULATORY_CARE_PROVIDER_SITE_OTHER): Payer: Medicare Other | Admitting: Adult Health

## 2019-09-12 DIAGNOSIS — F411 Generalized anxiety disorder: Secondary | ICD-10-CM | POA: Diagnosis not present

## 2019-09-12 DIAGNOSIS — F4323 Adjustment disorder with mixed anxiety and depressed mood: Secondary | ICD-10-CM

## 2019-09-12 DIAGNOSIS — G4700 Insomnia, unspecified: Secondary | ICD-10-CM

## 2019-09-12 DIAGNOSIS — F331 Major depressive disorder, recurrent, moderate: Secondary | ICD-10-CM | POA: Diagnosis not present

## 2019-09-12 NOTE — Progress Notes (Signed)
[facility] MD/PA/NP Initial Note  09/12/2019 12:42 PM Anthony Curry.  MRN:  [id number]  Chief Complaint:   HPI:   Describes mood today as "ok".  Mood symptoms - reports depression, anxiety, and irritability. Stating "I've been down I the dumps, but I'm coming out of it". Notes "fighting with his wife more than I want". Stating we have argued more lately than our entire marriage. Stating "I want that to stop". Has had stage 4 lung cancer for the past 10 years. Has labs every month and a scan every 3 months to monitor progression. Notes having increased mood symptoms when the 3 month visits approach. Hospitalized twice over the past few months for low sodium levels. Stating "that had me down". Taking medication to help maintain sodium levels. Oncologist recently prescribed Remeron 30mg  to help with mood symptoms. He has taken the medication for 4 nights and feels like he is sleeping much better. Is wanting to verify that the Remeron will help with his mood. Stating "I've taken it for four nights, and it hasn't kicked in yet". Seeing improvement in nterest and motivation. Taking medications as prescribed.  Energy levels generally lowe. Active, does not have a regular exercise routine.  Enjoys some usual interests and activities. Spending time with family - wife. Daughter local but planning to move to [place]. Son lives in [place].  Appetite decreased. Weight loss. Sleeps well most nights. Was averaging 5 to 6 hours - now sleeping at least 8 hours. Stating "it feels good to get some sleep". Focus and concentration stable. Completing tasks. Managing aspects of household.  Denies SI or HI.  Denies AH or VH.  Visit Diagnosis:    ICD-10-CM   1. Insomnia, unspecified type  G47.00   2. Generalized anxiety disorder  F41.1   3. Major depressive disorder, recurrent episode, moderate (HCC)  F33.1     Past Psychiatric History: Denies  Past Medical History:  Past Medical History:  Diagnosis Date  .  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 Adrenal mass ([place])   . Alcoholism in remission ([place])   . Anemia of chronic disease   . 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 Anxiety   . BPH (benign prostatic hyperplasia)   . Cancer ([place])    small cell/lung  . Complication of anesthesia 09/08/2015    JITTERY AFTER GALLBLADDER SURGERY  . GERD (gastroesophageal reflux disease)   . History of radiation therapy 11/14/16, 11/18/16, 11/22/16   Left upper lung: 54 Gy in 3 fractions  . Hypertension   . Hypertension 12/07/2016  . Neoplastic malignant related fatigue    IV tx. every 2 weeks last9-14-16 (Dr. Earlie Server), radiation last tx. 6 months  . 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 Persistent atrial fibrillation   . Pulmonary nodules   . Radiation 12/02/14-12/16/14   Left adrenal gland 30 Gy in 10 fractions    Past Surgical History:  Procedure Laterality Date  . ANKLE ARTHRODESIS  1995   rt fx-hardware in  . CARDIOVERSION N/A 09/29/2014   Procedure: CARDIOVERSION;  Surgeon: Josue Hector, MD;  Location: [facility] ENDOSCOPY;  Service: Cardiovascular;  Laterality: N/A;  . CHOLECYSTECTOMY  09/08/2015    laproscopic   . CHOLECYSTECTOMY N/A 09/08/2015   Procedure: LAPAROSCOPIC CHOLECYSTECTOMY WITH ATTEMPTED INTRAOPERATIVE CHOLANGIOGRAM ;  Surgeon: Fanny Skates, MD;  Location: [facility];  Service: General;  Laterality: N/A;  . COLONOSCOPY    . ERCP N/A 09/02/2015   Procedure: ENDOSCOPIC RETROGRADE CHOLANGIOPANCREATOGRAPHY (ERCP);  Surgeon: Arta Silence, MD;  Location: [facility] ENDOSCOPY;  Service: Endoscopy;  Laterality: N/A;  . ERCP N/A 09/07/2015   Procedure: ENDOSCOPIC RETROGRADE CHOLANGIOPANCREATOGRAPHY (ERCP);  Surgeon: Clarene Essex, MD;  Location: [facility] ENDOS[facility];  Service: Endoscopy;  Laterality: N/A;  . EXCISION / CURETTAGE BONE CYST FINGER  2005   rt thumb  . HEMORRHOID SURGERY    . Percutaneous Cholecytostomy tube     IVR insertion 06-13-15(Dr. Vernard Gambles)- 08-28-15 remains in place to drainage bag  .  TONSILLECTOMY    . TOTAL SHOULDER ARTHROPLASTY  02/03/2012   Procedure: TOTAL SHOULDER ARTHROPLASTY;  Surgeon: [name], MD;  Location: [facility];  Service: Orthopedics;  Laterality: Left;    Family Psychiatric History: Denies  Family History:  Family History  Problem Relation Age of Onset  . Lung cancer Mother   . Hypertension Father   . Heart attack Father   . Diabetes Paternal Grandmother   . Brain cancer Brother   . Heart attack Brother   . Neuropathy Neg Hx     Social History:  Social History   Socioeconomic History  . Marital status: Married    Spouse name: Azzan Butler  . Number of children: 3  . Years of education: 3  . Highest education level: Not on file  Occupational History  . 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 Financial resource strain: Not on file  . Food insecurity    Worry: Not on file    Inability: Not on file  . Transportation needs    Medical: Not on file    Non-medical: Not on file  Tobacco Use  . Smoking status: Former Smoker    Packs/day: 2.00    Years: 30.00    Pack years: 60.00    Types: Cigarettes    Quit date: 12/05/1990    Years since quitting: 28.7  . Smokeless tobacco: Never Used  Substance and Sexual Activity  . Alcohol use: Yes    Alcohol/week: 0.0 standard drinks    Comment: past hx.ETOH abuse,08-31-15 "occ. wine ,drinks non alcoholic beer occ."  . Drug use: No  . Sexual activity: Not Currently  Lifestyle  . Physical activity    Days per week: Not on file    Minutes per session: Not on file  . Stress: Not on file  Relationships  . Social Herbalist on phone: Not on file    Gets together: Not on file    Attends religious service: Not on file    Active member of club or organization: Not on file    Attends meetings of clubs or organizations: Not on file    Relationship status: Not on file  Other Topics Concern  . Not on file  Social  History Narrative   Lives at home with wife.   Caffeine use: Drinks coffee- 2 cups per day   Retired from [place]: No Known Allergies  Metabolic Disorder Labs: No results found for: HGBA1C, MPG No results found for: PROLACTIN Lab Results  Component Value Date   CHOL  05/30/2007    158        ATP III CLASSIFICATION:  <200     mg/dL   Desirable  200-239  mg/dL   Borderline High  >=240    mg/dL   High   TRIG 40 05/30/2007   HDL 55 05/30/2007   CHOLHDL 2.9 05/30/2007   VLDL 8 05/30/2007   LDLCALC  05/30/2007    95        Total Cholesterol/HDL:CHD Risk Coronary Heart Disease Risk Table                     Men   Women  1/2 Average Risk   3.4   3.3   Lab Results  Component Value Date   TSH 2.278 09/03/2019   TSH 3.112 08/06/2019    Therapeutic Level Labs: No results found for: LITHIUM No results found for: VALPROATE No components found for:  CBMZ  Current Medications: Current Outpatient Medications  Medication Sig Dispense Refill  . ALPRAZolam (XANAX) 0.25 MG tablet TAKE 1 TABLET(0.25 MG) BY MOUTH AT BEDTIME AS NEEDED FOR ANXIETY 30 tablet 0  . apixaban (ELIQUIS) 5 MG TABS tablet Take 5 mg by mouth 2 (two) times daily.    . Ascorbic Acid (VITAMIN C PO) Take 1,000 mg by mouth daily.     . B Complex-C (SUPER B COMPLEX PO) Take 1 each by mouth daily.     . calcium carbonate (OS-CAL) 600 MG TABS Take 600 mg by mouth daily with breakfast.    . demeclocycline (DECLOMYCIN) 150 MG tablet Take 2 tablets twice a day 120 tablet 2  . diltiazem (CARDIZEM CD) 180 MG 24 hr capsule Take 1 capsule (180 mg total) by mouth daily at 12 noon. 90 capsule 3  . gabapentin (NEURONTIN) 300 MG capsule Take 1 capsule (300 mg total) by mouth at bedtime. (Patient not taking: Reported on 08/06/2019) 30 capsule 0  . HYDROcodone-acetaminophen (NORCO/VICODIN) 5-325 MG tablet Take 1 tablet by mouth 3 (three) times daily as needed for moderate pain. 60 tablet 0  . KLOR-CON M20 20 MEQ tablet Take  20 mEq by mouth every morning.     . Lactobacillus (ACIDOPHILUS) CAPS capsule Take 1 capsule by mouth daily.    [name] levothyroxine (SYNTHROID, LEVOTHROID) 50 MCG tablet TAKE 1 TABLET (50 MCG TOTAL) BY MOUTH DAILY BEFORE BREAKFAST. 90 tablet 0  . MAGNESIUM PO Take 400 mg by mouth daily.     . mirtazapine (REMERON) 30 MG tablet Take 1 tablet (30 mg total) by mouth at bedtime. 30 tablet 2  . Multiple Vitamin (MULTIVITAMIN WITH MINERALS) TABS Take 1 tablet by mouth daily.    . Nivolumab (OPDIVO IV) Inject as directed every 2 weeks    . Nutritional Supplements (ENSURE COMPLETE PO) Take 1 Can by mouth 2 (two) times daily.    . Omega-3 Fatty Acids (FISH OIL PO) Take 1 tablet by mouth daily.    . tamsulosin (FLOMAX) 0.4 MG CAPS capsule Take 0.4 mg by mouth daily at 12 noon.     . thiamine (VITAMIN B-1) 100 MG tablet Take 1 tablet (100 mg total) by mouth daily. 30 tablet 6  . vitamin E 400 UNIT capsule Take 400 Units by mouth daily.     No current facility-administered medications for this visit.     Medication Side Effects: none  Orders placed this visit:  No orders of the defined types were placed in this encounter.   Psychiatric Specialty Exam:  ROS  There were no vitals taken for this visit.There is no height or weight on file to calculate BMI.  General Appearance: Neat and Well Groomed  Eye Contact:  Good  Speech:  Normal Rate  Volume:  Normal  Mood:  Depressed  Affect:  Congruent  Thought Process:  Coherent  Orientation:  Full (Time, Place, and Person)  Thought Content: Logical  Suicidal Thoughts:  No  Homicidal Thoughts:  No  Memory:  WNL  Judgement:  Good  Insight:  Good  Psychomotor Activity:  Normal  Concentration:  Concentration: Good  Recall:  Good  Fund of Knowledge: Good  Language: Good  Assets:  Communication Skills Desire for Improvement Financial Resources/Insurance Housing Intimacy Leisure Time Physical Health Resilience Social  Support Talents/Skills Transportation Vocational/Educational  ADL's:  Intact  Cognition: WNL  Prognosis:  Good   Screenings:  PHQ2-9     Follow Up Appointment 15 from 03/28/2017 in [facility] from 11/01/2016 in [facility] from 12/02/2014 in [facility]  PHQ-2 Total Score  0  0  0      Receiving Psychotherapy: Yes Andy Mitchum  Treatment Plan/Recommendations:   Plan:  1. Continue Remeron 30mg  at hs as prescribed  Continue therapy  RTC as needed  Patient advised to contact office with any questions, adverse effects, or acute worsening in signs and symptoms.   Aloha Gell, NP

## 2019-09-26 ENCOUNTER — Telehealth: Payer: Self-pay | Admitting: Internal Medicine

## 2019-09-26 NOTE — Telephone Encounter (Signed)
Returned patients phone call regarding upcoming appointments, patient is notified.

## 2019-10-01 ENCOUNTER — Inpatient Hospital Stay: Payer: Medicare Other | Attending: Internal Medicine | Admitting: Internal Medicine

## 2019-10-01 ENCOUNTER — Telehealth: Payer: Self-pay | Admitting: Internal Medicine

## 2019-10-01 ENCOUNTER — Other Ambulatory Visit: Payer: Self-pay

## 2019-10-01 ENCOUNTER — Inpatient Hospital Stay: Payer: Medicare Other

## 2019-10-01 ENCOUNTER — Encounter: Payer: Self-pay | Admitting: Internal Medicine

## 2019-10-01 VITALS — BP 154/74 | HR 98 | Temp 98.0°F | Resp 18 | Ht 71.0 in | Wt 141.8 lb

## 2019-10-01 DIAGNOSIS — C3431 Malignant neoplasm of lower lobe, right bronchus or lung: Secondary | ICD-10-CM

## 2019-10-01 DIAGNOSIS — T451X5A Adverse effect of antineoplastic and immunosuppressive drugs, initial encounter: Secondary | ICD-10-CM

## 2019-10-01 DIAGNOSIS — G620 Drug-induced polyneuropathy: Secondary | ICD-10-CM

## 2019-10-01 DIAGNOSIS — Z79899 Other long term (current) drug therapy: Secondary | ICD-10-CM | POA: Diagnosis not present

## 2019-10-01 DIAGNOSIS — I10 Essential (primary) hypertension: Secondary | ICD-10-CM

## 2019-10-01 DIAGNOSIS — C3490 Malignant neoplasm of unspecified part of unspecified bronchus or lung: Secondary | ICD-10-CM

## 2019-10-01 DIAGNOSIS — Z5112 Encounter for antineoplastic immunotherapy: Secondary | ICD-10-CM | POA: Diagnosis not present

## 2019-10-01 DIAGNOSIS — R5382 Chronic fatigue, unspecified: Secondary | ICD-10-CM

## 2019-10-01 LAB — CBC WITH DIFFERENTIAL (CANCER CENTER ONLY)
Abs Immature Granulocytes: 0.02 10*3/uL (ref 0.00–0.07)
Basophils Absolute: 0 10*3/uL (ref 0.0–0.1)
Basophils Relative: 1 %
Eosinophils Absolute: 0.2 10*3/uL (ref 0.0–0.5)
Eosinophils Relative: 3 %
HCT: 42.6 % (ref 39.0–52.0)
Hemoglobin: 14.6 g/dL (ref 13.0–17.0)
Immature Granulocytes: 0 %
Lymphocytes Relative: 18 %
Lymphs Abs: 1 10*3/uL (ref 0.7–4.0)
MCH: 31.6 pg (ref 26.0–34.0)
MCHC: 34.3 g/dL (ref 30.0–36.0)
MCV: 92.2 fL (ref 80.0–100.0)
Monocytes Absolute: 0.5 10*3/uL (ref 0.1–1.0)
Monocytes Relative: 9 %
Neutro Abs: 3.6 10*3/uL (ref 1.7–7.7)
Neutrophils Relative %: 69 %
Platelet Count: 235 10*3/uL (ref 150–400)
RBC: 4.62 MIL/uL (ref 4.22–5.81)
RDW: 13.8 % (ref 11.5–15.5)
WBC Count: 5.3 10*3/uL (ref 4.0–10.5)
nRBC: 0 % (ref 0.0–0.2)

## 2019-10-01 LAB — CMP (CANCER CENTER ONLY)
ALT: 11 U/L (ref 0–44)
AST: 17 U/L (ref 15–41)
Albumin: 3.7 g/dL (ref 3.5–5.0)
Alkaline Phosphatase: 125 U/L (ref 38–126)
Anion gap: 10 (ref 5–15)
BUN: 23 mg/dL (ref 8–23)
CO2: 25 mmol/L (ref 22–32)
Calcium: 9.7 mg/dL (ref 8.9–10.3)
Chloride: 102 mmol/L (ref 98–111)
Creatinine: 1.02 mg/dL (ref 0.61–1.24)
GFR, Est AFR Am: >60 mL/min (ref >60)
GFR, Estimated: >60 mL/min (ref >60)
Glucose, Bld: 112 mg/dL — ABNORMAL HIGH (ref 70–99)
Potassium: 4.5 mmol/L (ref 3.5–5.1)
Sodium: 137 mmol/L (ref 135–145)
Total Bilirubin: 0.6 mg/dL (ref 0.3–1.2)
Total Protein: 6.2 g/dL — ABNORMAL LOW (ref 6.5–8.1)

## 2019-10-01 LAB — TSH: TSH: 2.749 u[IU]/mL (ref 0.320–4.118)

## 2019-10-01 MED ORDER — SODIUM CHLORIDE 0.9 % IV SOLN
Freq: Once | INTRAVENOUS | Status: AC
  Administered 2019-10-01: 13:00:00 via INTRAVENOUS
  Filled 2019-10-01: qty 250

## 2019-10-01 MED ORDER — SODIUM CHLORIDE 0.9 % IV SOLN
480.0000 mg | Freq: Once | INTRAVENOUS | Status: AC
  Administered 2019-10-01: 480 mg via INTRAVENOUS
  Filled 2019-10-01: qty 48

## 2019-10-01 MED ORDER — EPOETIN ALFA-EPBX 40000 UNIT/ML IJ SOLN
INTRAMUSCULAR | Status: AC
  Filled 2019-10-01: qty 1

## 2019-10-01 NOTE — Telephone Encounter (Signed)
Per 10/27 los appts already scheduled.

## 2019-10-01 NOTE — Patient Instructions (Signed)
[facility] Discharge Instructions for Patients Receiving Chemotherapy  Today you received the following chemotherapy agents: Nivolumab  To help prevent nausea and vomiting after your treatment, we encourage you to take your nausea medication as directed.    If you develop nausea and vomiting that is not controlled by your nausea medication, call the clinic.   BELOW ARE SYMPTOMS THAT SHOULD BE REPORTED IMMEDIATELY:  *FEVER GREATER THAN 100.5 F  *CHILLS WITH OR WITHOUT FEVER  NAUSEA AND VOMITING THAT IS NOT CONTROLLED WITH YOUR NAUSEA MEDICATION  *UNUSUAL SHORTNESS OF BREATH  *UNUSUAL BRUISING OR BLEEDING  TENDERNESS IN MOUTH AND THROAT WITH OR WITHOUT PRESENCE OF ULCERS  *URINARY PROBLEMS  *BOWEL PROBLEMS  UNUSUAL RASH Items with * indicate a potential emergency and should be followed up as soon as possible.  Feel free to call the clinic should you have any questions or concerns. The clinic phone number is [phone].  Please show the CHEMO ALERT CARD at check-in to the Emergency Department and triage nurse.   

## 2019-10-01 NOTE — Progress Notes (Signed)
Anthony Curry [phone]) [phone]   Fax:([phone]) [phone]  OFFICE PROGRESS NOTE  [name], MD [place] [id number]  DIAGNOSIS: Stage IV (T1a, N0, M1b) non-small cell lung cancer, adenocarcinoma of the right lower lobe diagnosed in October 2014  Molecular profile: Negative forRET, ALK, BRAF, MET, EGFR.  Positive for ERBB2 A775T, [id number], KRAS G13E, MAP2K1 D67N (see full report).   PRIOR THERAPY:  1) Systemic chemotherapy with carboplatin for AUC of 5 and Alimta 500 mg/M2 every 3 weeks, status post 6 cycles with stable disease. First dose on 10/23/2013. 2) Maintenance systemic chemotherapy with single agent Alimta 500 mg/M2 every 3 weeks. Status post 8 cycles.  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 Sondra Come completed on 12/16/2014. 4) stereotactic radiotherapy to the enlarging right upper lobe lung nodule under the care of Dr. Sondra Come completed on 11/22/2016. 5) Immunotherapy with Nivolumab 240 mg IV every 2 weeks, status post 60 cycles. First cycle was given on 01/28/2015. Last dose 07/05/2017.  CURRENT THERAPY: Immunotherapy with Nivolumab 480 mg IV every 4 weeks. First dose 07/19/2017. Status post 28 cycles.  INTERVAL HISTORY: Anthony Curry. 78 y.o. male returns to the clinic today for follow-up visit.  The patient is celebrating his 78th birthday today.  He is feeling fine except for the fatigue.  He denied having any nausea, vomiting, diarrhea or constipation.  He has no chest pain, shortness of breath, cough or hemoptysis.  He denied having any headache or visual changes.  He has been tolerating his treatment with nivolumab fairly well.  He is here today for evaluation before starting cycle #29.  He was treated with gabapentin for peripheral neuropathy but he was not happy with the adverse effect especially the dizziness and he decided to discontinue it.   MEDICAL HISTORY: Past  Medical History:  Diagnosis Date   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[place])    Adrenal mass ([place])    Alcoholism in remission ([place])    Anemia of chronic disease    Antineoplastic chemotherapy induced anemia(285.3)    Anxiety    BPH (benign prostatic hyperplasia)    Cancer ([facility])    small cell/lung   Complication of anesthesia 09/08/2015    JITTERY AFTER GALLBLADDER SURGERY   GERD (gastroesophageal reflux disease)    History of radiation therapy 11/14/16, 11/18/16, 11/22/16   Left upper lung: 54 Gy in 3 fractions   Hypertension    Hypertension 12/07/2016   Neoplastic malignant related fatigue    IV tx. every 2 weeks last9-14-16 (Dr. Earlie Server), radiation last tx. 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  MEDICATIONS:  Current Outpatient Medications  Medication Sig Dispense Refill   ALPRAZolam (XANAX) 0.25 MG tablet TAKE 1 TABLET(0.25 MG) BY MOUTH AT BEDTIME AS NEEDED FOR ANXIETY 30 tablet 0   apixaban (ELIQUIS) 5 MG TABS tablet Take 5 mg by mouth 2 (two) times daily.     Ascorbic Acid (VITAMIN C PO) Take 1,000 mg by mouth daily.      B Complex-C (SUPER B COMPLEX PO) Take 1 each by mouth daily.      calcium carbonate (OS-CAL) 600 MG TABS Take 600 mg by mouth daily with breakfast.  demeclocycline (DECLOMYCIN) 150 MG tablet Take 2 tablets twice a day 120 tablet 2   diltiazem (CARDIZEM CD) 180 MG 24 hr capsule Take 1 capsule (180 mg total) by mouth daily at 12 noon. 90 capsule 3   gabapentin (NEURONTIN) 300 MG capsule Take 1 capsule (300 mg total) by mouth at bedtime. (Patient not taking: Reported on 08/06/2019) 30 capsule 0   HYDROcodone-acetaminophen (NORCO/VICODIN) 5-325 MG tablet Take 1 tablet by mouth 3 (three) times daily as needed for moderate pain. 60 tablet 0   KLOR-CON M20 20 MEQ  tablet Take 20 mEq by mouth every morning.      Lactobacillus (ACIDOPHILUS) CAPS capsule Take 1 capsule by mouth daily.     levothyroxine (SYNTHROID, LEVOTHROID) 50 MCG tablet TAKE 1 TABLET (50 MCG TOTAL) BY MOUTH DAILY BEFORE BREAKFAST. 90 tablet 0   MAGNESIUM PO Take 400 mg by mouth daily.      mirtazapine (REMERON) 30 MG tablet Take 1 tablet (30 mg total) by mouth at bedtime. 30 tablet 2   Multiple Vitamin (MULTIVITAMIN WITH MINERALS) TABS Take 1 tablet by mouth daily.     Nivolumab (OPDIVO IV) Inject as directed every 2 weeks     Nutritional Supplements (ENSURE COMPLETE PO) Take 1 Can by mouth 2 (two) times daily.     Omega-3 Fatty Acids (FISH OIL PO) Take 1 tablet by mouth daily.     tamsulosin (FLOMAX) 0.4 MG CAPS capsule Take 0.4 mg by mouth daily at 12 noon.      thiamine (VITAMIN B-1) 100 MG tablet Take 1 tablet (100 mg total) by mouth daily. 30 tablet 6   vitamin E 400 UNIT capsule Take 400 Units by mouth daily.     No current facility-administered medications for this visit.     SURGICAL HISTORY:  Past Surgical History:  Procedure Laterality Date   ANKLE ARTHRODESIS  1995   rt fx-hardware in   CARDIOVERSION N/A 09/29/2014   Procedure: CARDIOVERSION;  Surgeon: Josue Hector, MD;  Location: [facility] ENDOSCOPY;  Service: Cardiovascular;  Laterality: N/A;   CHOLECYSTECTOMY  09/08/2015    laproscopic    CHOLECYSTECTOMY N/A 09/08/2015   Procedure: LAPAROSCOPIC CHOLECYSTECTOMY WITH ATTEMPTED INTRAOPERATIVE CHOLANGIOGRAM ;  Surgeon: Fanny Skates, MD;  Location: [facility];  Service: General;  Laterality: N/A;   COLONOSCOPY     ERCP N/A 09/02/2015   Procedure: ENDOSCOPIC RETROGRADE CHOLANGIOPANCREATOGRAPHY (ERCP);  Surgeon: Arta Silence, MD;  Location: [facility] ENDOS[facility];  Service: Endoscopy;  Laterality: N/A;   ERCP N/A 09/07/2015   Procedure: ENDOSCOPIC RETROGRADE CHOLANGIOPANCREATOGRAPHY (ERCP);  Surgeon: Clarene Essex, MD;  Location: [facility] ENDOSCOPY;  Service: Endoscopy;   Laterality: N/A;   EXCISION / CURETTAGE BONE CYST FINGER  2005   rt thumb   HEMORRHOID SURGERY     Percutaneous Cholecytostomy tube     IVR insertion 06-13-15(Dr. Vernard Gambles)- 08-28-15 remains in place to drainage bag   TONSILLECTOMY     TOTAL SHOULDER ARTHROPLASTY  02/03/2012   Procedure: TOTAL SHOULDER ARTHROPLASTY;  Surgeon: [name], MD;  Location: [facility];  Service: Orthopedics;  Laterality: Left;    REVIEW OF SYSTEMS:  A comprehensive review of systems was negative except for: Constitutional: positive for fatigue Respiratory: positive for dyspnea on exertion Musculoskeletal: positive for arthralgias and muscle weakness Neurological: positive for paresthesia   PHYSICAL EXAMINATION: General appearance: alert, cooperative, fatigued and no distress Head: Normocephalic, without obvious abnormality, atraumatic Neck: no adenopathy, no JVD, supple, symmetrical, trachea midline and thyroid not enlarged, symmetric, no tenderness/mass/nodules  Lymph nodes: Cervical, supraclavicular, and axillary nodes normal. Resp: clear to auscultation bilaterally Back: symmetric, no curvature. ROM normal. No CVA tenderness. Cardio: regular rate and rhythm, S1, S2 normal, no murmur, click, rub or gallop GI: soft, non-tender; bowel sounds normal; no masses,  no organomegaly Extremities: extremities normal, atraumatic, no cyanosis or edema  ECOG PERFORMANCE STATUS: 1 - Symptomatic but completely ambulatory  Blood pressure (!) 154/74, pulse 98, temperature 98 F (36.7 C), temperature source Temporal, resp. rate 18, height '5\' 11"'$  (1.803 m), weight 141 lb 12.8 oz (64.3 kg), SpO2 98 %.  LABORATORY DATA: Lab Results  Component Value Date   WBC 5.3 10/01/2019   HGB 14.6 10/01/2019   HCT 42.6 10/01/2019   MCV 92.2 10/01/2019   PLT 235 10/01/2019      Chemistry      Component Value Date/Time   NA 134 (L) 09/03/2019 1057   NA 132 (L) 12/06/2017 0930   K 4.0 09/03/2019 1057   K 4.0  12/06/2017 0930   CL 101 09/03/2019 1057   CO2 24 09/03/2019 1057   CO2 26 12/06/2017 0930   BUN 11 09/03/2019 1057   BUN 15.4 12/06/2017 0930   CREATININE 0.78 09/03/2019 1057   CREATININE 0.9 12/06/2017 0930      Component Value Date/Time   CALCIUM 9.3 09/03/2019 1057   CALCIUM 9.9 12/06/2017 0930   ALKPHOS 119 09/03/2019 1057   ALKPHOS 107 12/06/2017 0930   AST 18 09/03/2019 1057   AST 20 12/06/2017 0930   ALT 11 09/03/2019 1057   ALT 14 12/06/2017 0930   BILITOT 0.6 09/03/2019 1057   BILITOT 0.76 12/06/2017 0930       RADIOGRAPHIC STUDIES: Ct Chest W Contrast  Result Date: 09/02/2019 CLINICAL DATA:  Lung cancer. Chemotherapy and radiation therapy complete. Ongoing immunotherapy. EXAM: CT CHEST, ABDOMEN, AND PELVIS WITH CONTRAST TECHNIQUE: Multidetector CT imaging of the chest, abdomen and pelvis was performed following the standard protocol during bolus administration of intravenous contrast. CONTRAST:  18m OMNIPAQUE IOHEXOL 300 MG/ML  SOLN COMPARISON:  05/13/2019 and 08/29/2016. FINDINGS: CT CHEST FINDINGS Cardiovascular: Atherosclerotic calcification of the aorta and coronary arteries. Heart size normal. Small to moderate pericardial effusion, slightly increased. Mediastinum/Nodes: No pathologically enlarged mediastinal, hilar or axillary lymph nodes. Esophagus is unremarkable. Lungs/Pleura: Centrilobular and paraseptal emphysema with bullous components in the apices. There are multiple areas of nodular/masslike ground-glass and sub solid lesions bilaterally, similar to 05/13/2019 but progressive from 08/29/2016. For example, a ground-glass mass in the apical segment right upper lobe measures 2.5 x 3.1 cm (4/28), stable from the most recent prior examination but increased in size from 08/29/2016, at which time it measured 1.7 x 1.8 cm. Sub solid nodule located slightly more inferiorly within the apical segment right upper lobe measures 2.3 x 2.9 cm (4/56), previously 2.4 x 2.4 cm on  08/29/2016. Its internal solid component measures 8 mm (4/49) and is new. 1.4 x 1.5 cm nodule in the right lower lobe (4/89) appears more solid than on 05/13/2019 and has surrounding architectural distortion. Bandlike scarring in the apical left upper lobe. Scattered pulmonary parenchymal scarring. Trace bilateral pleural fluid bilaterally. Calcification is seen in the left pleural space, posteriorly. Airway is unremarkable. Musculoskeletal: Old rib fractures. Extensive degenerative changes in the spine and right shoulder. Left shoulder arthroplasty. No worrisome lytic or sclerotic lesions. CT ABDOMEN PELVIS FINDINGS Hepatobiliary: Low-attenuation lesions in the liver measure up to 1.5 cm in the inferior right hepatic lobe, as before. Cholecystectomy. No biliary ductal  dilatation. 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 Pancreas: Negative. Spleen: Negative. Adrenals/Urinary Tract: Low-attenuation right adrenal nodule measures 1.7 cm and has a relative contrast washout of 76%. Left adrenal gland is unremarkable. Scarring in the right kidney. Low-attenuation lesions in the kidneys are subcentimeter in size and too small to characterize. Ureters are decompressed. Extensive bladder trabeculation and diverticula. Stomach/Bowel: Stomach, small bowel, appendix and colon are unremarkable. Vascular/Lymphatic: Atherosclerotic calcification of the aorta without aneurysm. No pathologically enlarged lymph nodes. Reproductive: Prostate is normal in size. Other: No free fluid.  Mesenteries and peritoneum are unremarkable. Musculoskeletal: Degenerative changes in the spine. No worrisome lytic or sclerotic lesions. IMPRESSION: 1. Multiple ground-glass or sub solid lesions in the lungs bilaterally, stable from 05/13/2019, but progressive from 08/29/2016, as detailed above. Findings are most indicative of multifocal adenocarcinoma. 2. Small to moderate pericardial fluid, slightly increased. 3. Right adrenal  adenoma. 4. Aortic atherosclerosis (ICD10-170.0). Coronary artery calcification. 5.  Emphysema (ICD10-J43.9). Electronically Signed   By: Lorin Picket M.D.   On: 09/02/2019 09:18   Ct Abdomen Pelvis W Contrast  Result Date: 09/02/2019 CLINICAL DATA:  Lung cancer. Chemotherapy and radiation therapy complete. Ongoing immunotherapy. EXAM: CT CHEST, ABDOMEN, AND PELVIS WITH CONTRAST TECHNIQUE: Multidetector CT imaging of the chest, abdomen and pelvis was performed following the standard protocol during bolus administration of intravenous contrast. CONTRAST:  14m OMNIPAQUE IOHEXOL 300 MG/ML  SOLN COMPARISON:  05/13/2019 and 08/29/2016. FINDINGS: CT CHEST FINDINGS Cardiovascular: Atherosclerotic calcification of the aorta and coronary arteries. Heart size normal. Small to moderate pericardial effusion, slightly increased. Mediastinum/Nodes: No pathologically enlarged mediastinal, hilar or axillary lymph nodes. Esophagus is unremarkable. Lungs/Pleura: Centrilobular and paraseptal emphysema with bullous components in the apices. There are multiple areas of nodular/masslike ground-glass and sub solid lesions bilaterally, similar to 05/13/2019 but progressive from 08/29/2016. For example, a ground-glass mass in the apical segment right upper lobe measures 2.5 x 3.1 cm (4/28), stable from the most recent prior examination but increased in size from 08/29/2016, at which time it measured 1.7 x 1.8 cm. Sub solid nodule located slightly more inferiorly within the apical segment right upper lobe measures 2.3 x 2.9 cm (4/56), previously 2.4 x 2.4 cm on 08/29/2016. Its internal solid component measures 8 mm (4/49) and is new. 1.4 x 1.5 cm nodule in the right lower lobe (4/89) appears more solid than on 05/13/2019 and has surrounding architectural distortion. Bandlike scarring in the apical left upper lobe. Scattered pulmonary parenchymal scarring. Trace bilateral pleural fluid bilaterally. Calcification is seen in the left  pleural space, posteriorly. Airway is unremarkable. Musculoskeletal: Old rib fractures. Extensive degenerative changes in the spine and right shoulder. Left shoulder arthroplasty. No worrisome lytic or sclerotic lesions. CT ABDOMEN PELVIS FINDINGS Hepatobiliary: Low-attenuation lesions in the liver measure up to 1.5 cm in the inferior right hepatic lobe, as before. Cholecystectomy. No biliary ductal dilatation. 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 Pancreas: Negative. Spleen: Negative. Adrenals/Urinary Tract: Low-attenuation right adrenal nodule measures 1.7 cm and has a relative contrast washout of 76%. Left adrenal gland is unremarkable. Scarring in the right kidney. Low-attenuation lesions in the kidneys are subcentimeter in size and too small to characterize. Ureters are decompressed. Extensive bladder trabeculation and diverticula. Stomach/Bowel: Stomach, small bowel, appendix and colon are unremarkable. Vascular/Lymphatic: Atherosclerotic calcification of the aorta without aneurysm. No pathologically enlarged lymph nodes. Reproductive: Prostate is normal in size. Other: No free fluid.  Mesenteries and peritoneum are unremarkable. Musculoskeletal: Degenerative changes in the spine. No worrisome lytic  or sclerotic lesions. IMPRESSION: 1. Multiple ground-glass or sub solid lesions in the lungs bilaterally, stable from 05/13/2019, but progressive from 08/29/2016, as detailed above. Findings are most indicative of multifocal adenocarcinoma. 2. Small to moderate pericardial fluid, slightly increased. 3. Right adrenal adenoma. 4. Aortic atherosclerosis (ICD10-170.0). Coronary artery calcification. 5.  Emphysema (ICD10-J43.9). Electronically Signed   By: Lorin Picket M.D.   On: 09/02/2019 09:18    ASSESSMENT AND PLAN:  This is a very pleasant 78 years old white male with metastatic non-small cell lung cancer, adenocarcinoma and currently on treatment with second line immunotherapy  with Nivolumab status post 60 cycles and has been tolerating his treatment well. He was recently switched to Nivolumab 480 MG IV every 4 weeks status post 28 cycles. The patient has been tolerating the treatment well except for the fatigue. I recommended for him to proceed with cycle #29 today as planned. For the fatigue I encouraged him to increase his exercise. For the depression and lack of appetite, I started the patient on Remeron 30 mg p.o. nightly.  He is tolerating it well. For the hyponatremia he will continue his current treatment with demeclocycline but I advised him to take 2 tablets in the morning and 1 tablet in the evening for at least 1 week. For pain management I changed his treatment again to hydrocodone 5/325 every 8 hours as needed for pain.  For the anxiety, he will continue on Xanax on a as needed basis. For the peripheral neuropathy and dizzy spells, he is scheduled to see neurology in the next few weeks. He will come back for follow-up visit in 4 weeks for evaluation before the next cycle of his treatment. The patient was advised to call immediately if he has any concerning symptoms in the interval. The patient voices understanding of current disease status and treatment options and is in agreement with the current care plan. All questions were answered. The patient knows to call the clinic with any problems, questions or concerns. We can certainly see the patient much sooner if necessary.  Disclaimer: This note was dictated with voice recognition software. Similar sounding words can inadvertently be transcribed and may not be corrected upon review.

## 2019-10-10 ENCOUNTER — Ambulatory Visit (INDEPENDENT_AMBULATORY_CARE_PROVIDER_SITE_OTHER): Payer: Medicare Other | Admitting: Adult Health

## 2019-10-10 ENCOUNTER — Other Ambulatory Visit: Payer: Self-pay

## 2019-10-10 ENCOUNTER — Encounter: Payer: Self-pay | Admitting: Adult Health

## 2019-10-10 DIAGNOSIS — F4323 Adjustment disorder with mixed anxiety and depressed mood: Secondary | ICD-10-CM

## 2019-10-10 DIAGNOSIS — F1021 Alcohol dependence, in remission: Secondary | ICD-10-CM

## 2019-10-10 DIAGNOSIS — G4700 Insomnia, unspecified: Secondary | ICD-10-CM | POA: Diagnosis not present

## 2019-10-10 DIAGNOSIS — F331 Major depressive disorder, recurrent, moderate: Secondary | ICD-10-CM

## 2019-10-10 DIAGNOSIS — F411 Generalized anxiety disorder: Secondary | ICD-10-CM

## 2019-10-10 NOTE — Progress Notes (Signed)
[facility] MD/PA/NP Initial Note  10/10/2019 11:38 AM Anthony Curry.  MRN:  [id number]  Chief Complaint:   HPI:   Describes mood today as "ok".  Mood symptoms - reports decreased depression, anxiety, and irritability. Stating "I'm doing quite a bit better now". Not feeling as down. Continues to "worry about everything", which cause anxiety and irritability at times. Has not been as "grouchy" with his wife. Stating "I just feel better about things". Feels like current medical issues "bring him down". Stating "every 3 months I go through hell worrying if the cancer is back". Seeing PCP next week and plans to discuss trying Gabapentin again. Improved interest and motivation. Taking medications as prescribed.  Energy levels lower. Active, does not have a regular exercise routine with current physical disabilities.  Enjoys some usual interests and activities. Spending time with family - wife. Daughter local. Son lives in [place].  Appetite varies. Weight fluctuates. Sleeps well most nights. Averages 8 hours. Focus and concentration stable. Completing tasks. Managing aspects of household.  Denies SI or HI. Denies AH or VH.  Visit Diagnosis:    ICD-10-CM   1. Insomnia, unspecified type  G47.00   2. Generalized anxiety disorder  F41.1   3. Major depressive disorder, recurrent episode, moderate (HCC)  F33.1   4. Adjustment disorder with mixed anxiety and depressed mood  F43.23   5. 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 Adrenal mass ([place])   . Alcoholism in remission ([place])   . Anemia of chronic disease   . 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 Anxiety   . BPH (benign prostatic hyperplasia)   . Cancer ([place])    small cell/lung  . Complication of anesthesia 09/08/2015    JITTERY AFTER GALLBLADDER SURGERY  . GERD (gastroesophageal reflux  disease)   . History of radiation therapy 11/14/16, 11/18/16, 11/22/16   Left upper lung: 54 Gy in 3 fractions  . Hypertension   . Hypertension 12/07/2016  . Neoplastic malignant related fatigue    IV tx. every 2 weeks last9-14-16 (Dr. Earlie Server), radiation last tx. 6 months  . 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 Persistent atrial fibrillation ([place])   . Pulmonary nodules   . Radiation 12/02/14-12/16/14   Left adrenal gland 30 Gy in 10 fractions    Past Surgical History:  Procedure Laterality Date  . ANKLE ARTHRODESIS  1995   rt fx-hardware in  . CARDIOVERSION N/A 09/29/2014   Procedure: CARDIOVERSION;  Surgeon: Josue Hector, MD;  Location: [facility] ENDOSCOPY;  Service: Cardiovascular;  Laterality: N/A;  . CHOLECYSTECTOMY  09/08/2015    laproscopic   . CHOLECYSTECTOMY N/A 09/08/2015   Procedure: LAPAROSCOPIC CHOLECYSTECTOMY WITH ATTEMPTED INTRAOPERATIVE CHOLANGIOGRAM ;  Surgeon: Fanny Skates, MD;  Location: [facility];  Service: General;  Laterality: N/A;  . COLONOSCOPY    . ERCP N/A 09/02/2015   Procedure: ENDOSCOPIC RETROGRADE CHOLANGIOPANCREATOGRAPHY (ERCP);  Surgeon: Arta Silence, MD;  Location: [facility]OS[facility];  Service: Endoscopy;  Laterality: N/A;  . ERCP N/A 09/07/2015   Procedure: ENDOSCOPIC RETROGRADE CHOLANGIOPANCREATOGRAPHY (ERCP);  Surgeon: Clarene Essex, MD;  Location: [facility] ENDOSCOPY;  Service: Endoscopy;  Laterality: N/A;  . EXCISION / CURETTAGE BONE CYST FINGER  2005   rt thumb  . HEMORRHOID SURGERY    . Percutaneous Cholecytostomy tube     IVR insertion 06-13-15(Dr. Vernard Gambles)- 08-28-15 remains in place to  drainage bag  . TONSILLECTOMY    . TOTAL SHOULDER ARTHROPLASTY  02/03/2012   Procedure: TOTAL SHOULDER ARTHROPLASTY;  Surgeon: [name], MD;  Location: [facility];  Service: Orthopedics;  Laterality: Left;    Family Psychiatric History: Denies  Family History:  Family History  Problem Relation Age of Onset  . Lung cancer Mother   . Hypertension Father    . Heart attack Father   . Diabetes Paternal Grandmother   . Brain cancer Brother   . Heart attack Brother   . Neuropathy Neg Hx     Social History:  Social History   Socioeconomic History  . Marital status: Married    Spouse name: Jceon Alverio  . Number of children: 3  . Years of education: 10  . Highest education level: Not on file  Occupational History  . 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 Financial resource strain: Not on file  . Food insecurity    Worry: Not on file    Inability: Not on file  . Transportation needs    Medical: Not on file    Non-medical: Not on file  Tobacco Use  . Smoking status: Former Smoker    Packs/day: 2.00    Years: 30.00    Pack years: 60.00    Types: Cigarettes    Quit date: 12/05/1990    Years since quitting: 28.8  . Smokeless tobacco: Never Used  Substance and Sexual Activity  . Alcohol use: Yes    Alcohol/week: 0.0 standard drinks    Comment: past hx.ETOH abuse,08-31-15 "occ. wine ,drinks non alcoholic beer occ."  . Drug use: No  . Sexual activity: Not Currently  Lifestyle  . Physical activity    Days per week: Not on file    Minutes per session: Not on file  . Stress: Not on file  Relationships  . Social Herbalist on phone: Not on file    Gets together: Not on file    Attends religious service: Not on file    Active member of club or organization: Not on file    Attends meetings of clubs or organizations: Not on file    Relationship status: Not on file  Other Topics Concern  . Not on file  Social History Narrative   Lives at home with wife.   Caffeine use: Drinks coffee- 2 cups per day   Retired from [employer]: No Known Allergies  Metabolic Disorder Labs: No results found for: HGBA1C, MPG No results found for: PROLACTIN Lab Results  Component Value Date   CHOL  05/30/2007    158        ATP III CLASSIFICATION:  <200     mg/dL    Desirable  200-239  mg/dL   Borderline High  >=240    mg/dL   High   TRIG 40 05/30/2007   HDL 55 05/30/2007   CHOLHDL 2.9 05/30/2007   VLDL 8 05/30/2007   LDLCALC  05/30/2007    95        Total Cholesterol/HDL:CHD Risk Coronary Heart Disease Risk Table                     Men   Women  1/2 Average Risk   3.4   3.3   Lab Results  Component Value Date   TSH 2.749 10/01/2019   TSH 2.278 09/03/2019  Therapeutic Level Labs: No results found for: LITHIUM No results found for: VALPROATE No components found for:  CBMZ  Current Medications: Current Outpatient Medications  Medication Sig Dispense Refill  . ALPRAZolam (XANAX) 0.5 MG tablet Take 0.5 mg by mouth at bedtime as needed.    [name] apixaban (ELIQUIS) 5 MG TABS tablet Take 5 mg by mouth 2 (two) times daily.    . Ascorbic Acid (VITAMIN C PO) Take 1,000 mg by mouth daily.     . B Complex-C (SUPER B COMPLEX PO) Take 1 each by mouth daily.     . calcium carbonate (OS-CAL) 600 MG TABS Take 600 mg by mouth daily with breakfast.    . demeclocycline (DECLOMYCIN) 150 MG tablet Take 2 tablets twice a day 120 tablet 2  . diltiazem (CARDIZEM CD) 180 MG 24 hr capsule Take 1 capsule (180 mg total) by mouth daily at 12 noon. 90 capsule 3  . gabapentin (NEURONTIN) 300 MG capsule Take 1 capsule (300 mg total) by mouth at bedtime. (Patient not taking: Reported on 08/06/2019) 30 capsule 0  . HYDROcodone-acetaminophen (NORCO/VICODIN) 5-325 MG tablet Take 1 tablet by mouth 3 (three) times daily as needed for moderate pain. 60 tablet 0  . KLOR-CON M20 20 MEQ tablet Take 20 mEq by mouth every morning.     . Lactobacillus (ACIDOPHILUS) CAPS capsule Take 1 capsule by mouth daily.    [name] levothyroxine (SYNTHROID, LEVOTHROID) 50 MCG tablet TAKE 1 TABLET (50 MCG TOTAL) BY MOUTH DAILY BEFORE BREAKFAST. 90 tablet 0  . MAGNESIUM PO Take 400 mg by mouth daily.     . mirtazapine (REMERON) 30 MG tablet Take 1 tablet (30 mg total) by mouth at bedtime. 30 tablet 2  .  Multiple Vitamin (MULTIVITAMIN WITH MINERALS) TABS Take 1 tablet by mouth daily.    . Nivolumab (OPDIVO IV) Inject as directed every 2 weeks    . Nutritional Supplements (ENSURE COMPLETE PO) Take 1 Can by mouth 2 (two) times daily.    . Omega-3 Fatty Acids (FISH OIL PO) Take 1 tablet by mouth daily.    . tamsulosin (FLOMAX) 0.4 MG CAPS capsule Take 0.4 mg by mouth daily at 12 noon.     . thiamine (VITAMIN B-1) 100 MG tablet Take 1 tablet (100 mg total) by mouth daily. 30 tablet 6  . vitamin E 400 UNIT capsule Take 400 Units by mouth daily.     No current facility-administered medications for this visit.     Medication Side Effects: none  Orders placed this visit:  No orders of the defined types were placed in this encounter.   Psychiatric Specialty Exam:  ROS  There were no vitals taken for this visit.There is no height or weight on file to calculate BMI.  General Appearance: Neat and Well Groomed  Eye Contact:  Good  Speech:  Normal Rate  Volume:  Normal  Mood:  Euthymic  Affect:  Congruent  Thought Process:  Coherent  Orientation:  Full (Time, Place, and Person)  Thought Content: Logical   Suicidal Thoughts:  No  Homicidal Thoughts:  No  Memory:  WNL  Judgement:  Good  Insight:  Good  Psychomotor Activity:  Normal  Concentration:  Concentration: Good  Recall:  Good  Fund of Knowledge: Good  Language: Good  Assets:  Communication Skills Desire for Improvement Financial Resources/Insurance Housing Intimacy Leisure Time Physical Health Resilience Social Support Talents/Skills Transportation Vocational/Educational  ADL's:  Intact  Cognition: WNL  Prognosis:  Good  Screenings:  PHQ2-9     Follow Up Appointment 15 from 03/28/2017 in [facility] from 11/01/2016 in [facility] from 12/02/2014 in [facility]  PHQ-2 Total Score  0   0  0      Receiving Psychotherapy: Yes Jonni Sanger Mitchum  Treatment Plan/Recommendations:   Plan:  1. Continue Remeron 30mg  at hs as prescribed  Discussed starting Gabapentin at 100mg  three times a day (with a titration) to help with initial side effects/tolerability. Will potentially help with anxiety/worrying and neuropathy.  Continue therapy - Andy Mitchum.  RTC as needed  Patient advised to contact office with any questions, adverse effects, or acute worsening in signs and symptoms.   Aloha Gell, NP

## 2019-10-11 DIAGNOSIS — E871 Hypo-osmolality and hyponatremia: Secondary | ICD-10-CM | POA: Diagnosis not present

## 2019-10-26 DIAGNOSIS — L0231 Cutaneous abscess of buttock: Secondary | ICD-10-CM | POA: Diagnosis not present

## 2019-10-26 DIAGNOSIS — L089 Local infection of the skin and subcutaneous tissue, unspecified: Secondary | ICD-10-CM | POA: Diagnosis not present

## 2019-10-28 DIAGNOSIS — L0231 Cutaneous abscess of buttock: Secondary | ICD-10-CM | POA: Diagnosis not present

## 2019-10-29 ENCOUNTER — Inpatient Hospital Stay (HOSPITAL_BASED_OUTPATIENT_CLINIC_OR_DEPARTMENT_OTHER): Payer: Medicare Other | Admitting: Internal Medicine

## 2019-10-29 ENCOUNTER — Inpatient Hospital Stay: Payer: Medicare Other | Attending: Internal Medicine

## 2019-10-29 ENCOUNTER — Encounter: Payer: Self-pay | Admitting: Internal Medicine

## 2019-10-29 ENCOUNTER — Inpatient Hospital Stay: Payer: Medicare Other

## 2019-10-29 ENCOUNTER — Other Ambulatory Visit: Payer: Self-pay

## 2019-10-29 VITALS — BP 143/67 | HR 81 | Temp 97.8°F | Resp 17 | Ht 71.0 in | Wt 143.4 lb

## 2019-10-29 DIAGNOSIS — C3431 Malignant neoplasm of lower lobe, right bronchus or lung: Secondary | ICD-10-CM

## 2019-10-29 DIAGNOSIS — I10 Essential (primary) hypertension: Secondary | ICD-10-CM | POA: Diagnosis not present

## 2019-10-29 DIAGNOSIS — R42 Dizziness and giddiness: Secondary | ICD-10-CM

## 2019-10-29 DIAGNOSIS — Z5112 Encounter for antineoplastic immunotherapy: Secondary | ICD-10-CM

## 2019-10-29 DIAGNOSIS — Z79899 Other long term (current) drug therapy: Secondary | ICD-10-CM | POA: Diagnosis not present

## 2019-10-29 DIAGNOSIS — T451X5A Adverse effect of antineoplastic and immunosuppressive drugs, initial encounter: Secondary | ICD-10-CM

## 2019-10-29 DIAGNOSIS — E871 Hypo-osmolality and hyponatremia: Secondary | ICD-10-CM | POA: Diagnosis not present

## 2019-10-29 DIAGNOSIS — R5382 Chronic fatigue, unspecified: Secondary | ICD-10-CM

## 2019-10-29 DIAGNOSIS — G620 Drug-induced polyneuropathy: Secondary | ICD-10-CM

## 2019-10-29 LAB — CBC WITH DIFFERENTIAL (CANCER CENTER ONLY)
Abs Immature Granulocytes: 0.02 10*3/uL (ref 0.00–0.07)
Basophils Absolute: 0 10*3/uL (ref 0.0–0.1)
Basophils Relative: 1 %
Eosinophils Absolute: 0.3 10*3/uL (ref 0.0–0.5)
Eosinophils Relative: 5 %
HCT: 41.7 % (ref 39.0–52.0)
Hemoglobin: 14.3 g/dL (ref 13.0–17.0)
Immature Granulocytes: 0 %
Lymphocytes Relative: 18 %
Lymphs Abs: 1.1 10*3/uL (ref 0.7–4.0)
MCH: 31.6 pg (ref 26.0–34.0)
MCHC: 34.3 g/dL (ref 30.0–36.0)
MCV: 92.3 fL (ref 80.0–100.0)
Monocytes Absolute: 0.6 10*3/uL (ref 0.1–1.0)
Monocytes Relative: 11 %
Neutro Abs: 3.8 10*3/uL (ref 1.7–7.7)
Neutrophils Relative %: 65 %
Platelet Count: 244 10*3/uL (ref 150–400)
RBC: 4.52 MIL/uL (ref 4.22–5.81)
RDW: 13.7 % (ref 11.5–15.5)
WBC Count: 5.9 10*3/uL (ref 4.0–10.5)
nRBC: 0 % (ref 0.0–0.2)

## 2019-10-29 LAB — CMP (CANCER CENTER ONLY)
ALT: 13 U/L (ref 0–44)
AST: 19 U/L (ref 15–41)
Albumin: 3.6 g/dL (ref 3.5–5.0)
Alkaline Phosphatase: 118 U/L (ref 38–126)
Anion gap: 9 (ref 5–15)
BUN: 23 mg/dL (ref 8–23)
CO2: 25 mmol/L (ref 22–32)
Calcium: 9.2 mg/dL (ref 8.9–10.3)
Chloride: 101 mmol/L (ref 98–111)
Creatinine: 0.98 mg/dL (ref 0.61–1.24)
GFR, Est AFR Am: >60 mL/min (ref >60)
GFR, Estimated: >60 mL/min (ref >60)
Glucose, Bld: 88 mg/dL (ref 70–99)
Potassium: 4.4 mmol/L (ref 3.5–5.1)
Sodium: 135 mmol/L (ref 135–145)
Total Bilirubin: 0.6 mg/dL (ref 0.3–1.2)
Total Protein: 6 g/dL — ABNORMAL LOW (ref 6.5–8.1)

## 2019-10-29 LAB — TSH: TSH: 4.616 u[IU]/mL — ABNORMAL HIGH (ref 0.320–4.118)

## 2019-10-29 MED ORDER — SODIUM CHLORIDE 0.9 % IV SOLN
480.0000 mg | Freq: Once | INTRAVENOUS | Status: AC
  Administered 2019-10-29: 12:00:00 480 mg via INTRAVENOUS
  Filled 2019-10-29: qty 48

## 2019-10-29 MED ORDER — SODIUM CHLORIDE 0.9 % IV SOLN
Freq: Once | INTRAVENOUS | Status: AC
  Administered 2019-10-29: 11:00:00 via INTRAVENOUS
  Filled 2019-10-29: qty 250

## 2019-10-29 NOTE — Patient Instructions (Signed)
[facility] Discharge Instructions for Patients Receiving Chemotherapy  Today you received the following Immunotherapy agent: Nivolumab  (Opdivo)  To help prevent nausea and vomiting after your treatment, we encourage you to take your nausea medication as directed by your MD.   If you develop nausea and vomiting that is not controlled by your nausea medication, call the clinic.   BELOW ARE SYMPTOMS THAT SHOULD BE REPORTED IMMEDIATELY:  *FEVER GREATER THAN 100.5 F  *CHILLS WITH OR WITHOUT FEVER  NAUSEA AND VOMITING THAT IS NOT CONTROLLED WITH YOUR NAUSEA MEDICATION  *UNUSUAL SHORTNESS OF BREATH  *UNUSUAL BRUISING OR BLEEDING  TENDERNESS IN MOUTH AND THROAT WITH OR WITHOUT PRESENCE OF ULCERS  *URINARY PROBLEMS  *BOWEL PROBLEMS  UNUSUAL RASH Items with * indicate a potential emergency and should be followed up as soon as possible.  Feel free to call the clinic should you have any questions or concerns. The clinic phone number is [phone].  Please show the [facility] at check-in to the Emergency Department and triage nurse. Coronavirus (COVID-19) Are you at risk?  Are you at risk for the Coronavirus (COVID-19)?  To be considered HIGH RISK for Coronavirus (COVID-19), you have to meet the following criteria:  . Traveled to [place], [place], [place], [place] or [place]; or in the [place] to [place], [place], [place], or [place]; and have fever, cough, and shortness of breath within the last 2 weeks of travel OR . Been in close contact with a person diagnosed with COVID-19 within the last 2 weeks and have fever, cough, and shortness of breath . IF YOU DO NOT MEET THESE CRITERIA, YOU ARE CONSIDERED LOW RISK FOR COVID-19.  What to do if you are HIGH RISK for COVID-19?  [name] If you are having a medical emergency, call 911. . Seek medical care right away. Before you go to a doctor's office, urgent care or emergency department, call ahead and tell  them about your recent travel, contact with someone diagnosed with COVID-19, and your symptoms. You should receive instructions from your physician's office regarding next steps of care.  . When you arrive at healthcare provider, tell the healthcare staff immediately you have returned from visiting [place], [place], [place], [place] or [place]; or traveled in the [place] to [place], [place], [place], or [place]; in the last two weeks or you have been in close contact with a person diagnosed with COVID-19 in the last 2 weeks.   . Tell the health care staff about your symptoms: fever, cough and shortness of breath. . After you have been seen by a medical provider, you will be either: o Tested for (COVID-19) and discharged home on quarantine except to seek medical care if symptoms worsen, and asked to  - Stay home and avoid contact with others until you get your results (4-5 days)  - Avoid travel on public transportation if possible (such as bus, train, or airplane) or o Sent to the Emergency Department by EMS for evaluation, COVID-19 testing, and possible admission depending on your condition and test results.  What to do if you are LOW RISK for COVID-19?  Reduce your risk of any infection by using the same precautions used for avoiding the common cold or flu:  [name] Wash your hands often with soap and warm water for at least 20 seconds.  If soap and water are not readily available, use an alcohol-based hand sanitizer with at least 60% alcohol.  [name]  If coughing or sneezing, cover your mouth and nose by coughing or sneezing into the elbow areas of your shirt or coat, into a tissue or into your sleeve (not your hands). . Avoid shaking hands with others and consider head nods or verbal greetings only. . Avoid touching your eyes, nose, or mouth with unwashed hands.  . Avoid close contact with people who are sick. . Avoid places or events with large numbers of people in one location, like concerts or  sporting events. . Carefully consider travel plans you have or are making. . If you are planning any travel outside or inside the [place], visit the CDC's Travelers' Health webpage for the latest health notices. . If you have some symptoms but not all symptoms, continue to monitor at home and seek medical attention if your symptoms worsen. . If you are having a medical emergency, call 911.   [facility] / e-Visit: [email or web address]         [facility]: [facility]: [phone]                   [facility]: [phone]

## 2019-10-29 NOTE — Progress Notes (Signed)
[facility] [phone]   [phone]  OFFICE PROGRESS NOTE  [name], MD [place] [address]  DIAGNOSIS: Stage IV (T1a, N0, M1b) non-small cell lung cancer, adenocarcinoma of the right lower lobe diagnosed in October 2014  Molecular profile: Negative forRET, ALK, BRAF, MET, EGFR.  Positive for ERBB2 A775T, [id number], KRAS G13E, MAP2K1 D67N (see full report).   PRIOR THERAPY:  1) Systemic chemotherapy with carboplatin for AUC of 5 and Alimta 500 mg/M2 every 3 weeks, status post 6 cycles with stable disease. First dose on 10/23/2013. 2) Maintenance systemic chemotherapy with single agent Alimta 500 mg/M2 every 3 weeks. Status post 8 cycles.  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 Sondra Come completed on 12/16/2014. 4) stereotactic radiotherapy to the enlarging right upper lobe lung nodule under the care of Dr. Sondra Come completed on 11/22/2016. 5) Immunotherapy with Nivolumab 240 mg IV every 2 weeks, status post 60 cycles. First cycle was given on 01/28/2015. Last dose 07/05/2017.  CURRENT THERAPY: Immunotherapy with Nivolumab 480 mg IV every 4 weeks. First dose 07/19/2017. Status post 29 cycles.  INTERVAL HISTORY: Anthony Curry. 78 y.o. male returns to the clinic today for follow-up visit.  The patient is feeling fine today with no concerning complaints except for the fatigue and persistent dizzy spells.  He denied having any current chest pain, shortness of breath, cough or hemoptysis.  He denied having any fever or chills.  He has no nausea, vomiting, diarrhea or constipation.  He has no headache or visual changes.  He continues to tolerate his treatment with nivolumab fairly well.  He has a sebaceous cyst drainage recently and he is currently on treatment with doxycycline for a total of 10 days.  He is here today for evaluation before starting cycle #30 of his  treatment.    MEDICAL HISTORY: Past Medical History:  Diagnosis Date  . 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 Adrenal mass ([place])   . Alcoholism in remission ([place])   . Anemia of chronic disease   . 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 Anxiety   . BPH (benign prostatic hyperplasia)   . Cancer ([place])    small cell/lung  . Complication of anesthesia 09/08/2015    JITTERY AFTER GALLBLADDER SURGERY  . GERD (gastroesophageal reflux disease)   . History of radiation therapy 11/14/16, 11/18/16, 11/22/16   Left upper lung: 54 Gy in 3 fractions  . Hypertension   . Hypertension 12/07/2016  . Neoplastic malignant related fatigue    IV tx. every 2 weeks last9-14-16 (Dr. Earlie Server), radiation last tx. 6 months  . 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 Persistent atrial fibrillation ([place])   . Pulmonary nodules   . Radiation 12/02/14-12/16/14   Left adrenal gland 30 Gy in 10 fractions    ALLERGIES:  has No Known Allergies.  MEDICATIONS:  Current Outpatient Medications  Medication Sig Dispense Refill  . ALPRAZolam (XANAX) 0.5 MG tablet Take 0.5 mg by mouth at bedtime as needed.    [name] apixaban (ELIQUIS) 5 MG TABS tablet Take 5 mg by mouth 2 (two) times daily.    . Ascorbic Acid (VITAMIN C PO) Take 1,000 mg by mouth daily.     . B Complex-C (SUPER B COMPLEX PO) Take 1 each by mouth daily.     . calcium carbonate (OS-CAL) 600 MG TABS Take 600 mg by mouth daily with breakfast.    .  demeclocycline (DECLOMYCIN) 150 MG tablet Take 2 tablets twice a day 120 tablet 2  . diltiazem (CARDIZEM CD) 180 MG 24 hr capsule Take 1 capsule (180 mg total) by mouth daily at 12 noon. 90 capsule 3  . doxycycline (VIBRAMYCIN) 100 MG capsule Take 100 mg by mouth 2 (two) times daily.    . fluticasone (FLONASE) 50 MCG/ACT nasal spray 2 sprays at bedtime.    [name] HYDROcodone-acetaminophen (NORCO/VICODIN) 5-325 MG tablet Take 1 tablet by mouth 3 (three) times daily as needed for moderate pain. 60 tablet 0  .  KLOR-CON M20 20 MEQ tablet Take 20 mEq by mouth every morning.     . Lactobacillus (ACIDOPHILUS) CAPS capsule Take 1 capsule by mouth daily.    [name] levothyroxine (SYNTHROID, LEVOTHROID) 50 MCG tablet TAKE 1 TABLET (50 MCG TOTAL) BY MOUTH DAILY BEFORE BREAKFAST. 90 tablet 0  . MAGNESIUM PO Take 400 mg by mouth daily.     . mirtazapine (REMERON) 30 MG tablet Take 1 tablet (30 mg total) by mouth at bedtime. 30 tablet 2  . Multiple Vitamin (MULTIVITAMIN WITH MINERALS) TABS Take 1 tablet by mouth daily.    . Nivolumab (OPDIVO IV) Inject as directed every 2 weeks    . Nutritional Supplements (ENSURE COMPLETE PO) Take 1 Can by mouth 2 (two) times daily.    . Omega-3 Fatty Acids (FISH OIL PO) Take 1 tablet by mouth daily.    . tamsulosin (FLOMAX) 0.4 MG CAPS capsule Take 0.4 mg by mouth daily at 12 noon.     . thiamine (VITAMIN B-1) 100 MG tablet Take 1 tablet (100 mg total) by mouth daily. 30 tablet 6  . vitamin E 400 UNIT capsule Take 400 Units by mouth daily.     No current facility-administered medications for this visit.     SURGICAL HISTORY:  Past Surgical History:  Procedure Laterality Date  . ANKLE ARTHRODESIS  1995   rt fx-hardware in  . CARDIOVERSION N/A 09/29/2014   Procedure: CARDIOVERSION;  Surgeon: Josue Hector, MD;  Location: [facility] ENDOSCOPY;  Service: Cardiovascular;  Laterality: N/A;  . CHOLECYSTECTOMY  09/08/2015    laproscopic   . CHOLECYSTECTOMY N/A 09/08/2015   Procedure: LAPAROSCOPIC CHOLECYSTECTOMY WITH ATTEMPTED INTRAOPERATIVE CHOLANGIOGRAM ;  Surgeon: Fanny Skates, MD;  Location: [facility];  Service: General;  Laterality: N/A;  . COLONOSCOPY    . ERCP N/A 09/02/2015   Procedure: ENDOSCOPIC RETROGRADE CHOLANGIOPANCREATOGRAPHY (ERCP);  Surgeon: Arta Silence, MD;  Location: [facility] ENDOSCOPY;  Service: Endoscopy;  Laterality: N/A;  . ERCP N/A 09/07/2015   Procedure: ENDOSCOPIC RETROGRADE CHOLANGIOPANCREATOGRAPHY (ERCP);  Surgeon: Clarene Essex, MD;  Location: [facility] ENDOSCOPY;  Service:  Endoscopy;  Laterality: N/A;  . EXCISION / CURETTAGE BONE CYST FINGER  2005   rt thumb  . HEMORRHOID SURGERY    . Percutaneous Cholecytostomy tube     IVR insertion 06-13-15(Dr. Vernard Gambles)- 08-28-15 remains in place to drainage bag  . TONSILLECTOMY    . TOTAL SHOULDER ARTHROPLASTY  02/03/2012   Procedure: TOTAL SHOULDER ARTHROPLASTY;  Surgeon: [name], MD;  Location: [facility];  Service: Orthopedics;  Laterality: Left;    REVIEW OF SYSTEMS:  A comprehensive review of systems was negative except for: Constitutional: positive for fatigue Musculoskeletal: positive for arthralgias and muscle weakness Neurological: positive for dizziness and paresthesia   PHYSICAL EXAMINATION: General appearance: alert, cooperative, fatigued and no distress Head: Normocephalic, without obvious abnormality, atraumatic Neck: no adenopathy, no JVD, supple, symmetrical, trachea midline and thyroid not enlarged, symmetric,  no tenderness/mass/nodules Lymph nodes: Cervical, supraclavicular, and axillary nodes normal. Resp: clear to auscultation bilaterally Back: symmetric, no curvature. ROM normal. No CVA tenderness. Cardio: regular rate and rhythm, S1, S2 normal, no murmur, click, rub or gallop GI: soft, non-tender; bowel sounds normal; no masses,  no organomegaly Extremities: extremities normal, atraumatic, no cyanosis or edema Neurologic: Alert and oriented X 3, normal strength and tone. Normal symmetric reflexes. Normal coordination and gait  ECOG PERFORMANCE STATUS: 1 - Symptomatic but completely ambulatory  Blood pressure (!) 143/67, pulse 81, temperature 97.8 F (36.6 C), temperature source Temporal, resp. rate 17, height 5' 11"  (1.803 m), weight 143 lb 6.4 oz (65 kg), SpO2 99 %.  LABORATORY DATA: Lab Results  Component Value Date   WBC 5.9 10/29/2019   HGB 14.3 10/29/2019   HCT 41.7 10/29/2019   MCV 92.3 10/29/2019   PLT 244 10/29/2019      Chemistry      Component Value  Date/Time   NA 137 10/01/2019 1049   NA 132 (L) 12/06/2017 0930   K 4.5 10/01/2019 1049   K 4.0 12/06/2017 0930   CL 102 10/01/2019 1049   CO2 25 10/01/2019 1049   CO2 26 12/06/2017 0930   BUN 23 10/01/2019 1049   BUN 15.4 12/06/2017 0930   CREATININE 1.02 10/01/2019 1049   CREATININE 0.9 12/06/2017 0930      Component Value Date/Time   CALCIUM 9.7 10/01/2019 1049   CALCIUM 9.9 12/06/2017 0930   ALKPHOS 125 10/01/2019 1049   ALKPHOS 107 12/06/2017 0930   AST 17 10/01/2019 1049   AST 20 12/06/2017 0930   ALT 11 10/01/2019 1049   ALT 14 12/06/2017 0930   BILITOT 0.6 10/01/2019 1049   BILITOT 0.76 12/06/2017 0930       RADIOGRAPHIC STUDIES: No results found.  ASSESSMENT AND PLAN:  This is a very pleasant 78 years old white male with metastatic non-small cell lung cancer, adenocarcinoma and currently on treatment with second line immunotherapy with Nivolumab status post 60 cycles and has been tolerating his treatment well. He was recently switched to Nivolumab 480 MG IV every 4 weeks status post 29 cycles. The patient continues to tolerate this treatment well with no concerning adverse effects. I recommended for him to proceed with cycle #30 today as planned. For the depression, he will continue on his treatment with Remeron 30 mg p.o. nightly. For the hyponatremia he is currently on demeclocycline and his sodium level is normal today. For the anxiety he will continue on Xanax. For the pain management the patient is currently on hydrocodone on as-needed basis. He will come back for follow-up visit in 4 weeks for evaluation before the next cycle of his treatment. He was advised to call immediately if he has any concerning symptoms in the interval. For the peripheral neuropathy and dizzy spells, he is scheduled to see neurology in the next few weeks.  The patient voices understanding of current disease status and treatment options and is in agreement with the current care plan.  All questions were answered. The patient knows to call the clinic with any problems, questions or concerns. We can certainly see the patient much sooner if necessary.  Disclaimer: This note was dictated with voice recognition software. Similar sounding words can inadvertently be transcribed and may not be corrected upon review.

## 2019-11-06 DIAGNOSIS — H353221 Exudative age-related macular degeneration, left eye, with active choroidal neovascularization: Secondary | ICD-10-CM | POA: Diagnosis not present

## 2019-11-06 DIAGNOSIS — H353112 Nonexudative age-related macular degeneration, right eye, intermediate dry stage: Secondary | ICD-10-CM | POA: Diagnosis not present

## 2019-11-25 ENCOUNTER — Telehealth: Payer: Self-pay | Admitting: Medical Oncology

## 2019-11-25 ENCOUNTER — Ambulatory Visit: Payer: Medicare Other | Attending: Internal Medicine

## 2019-11-25 DIAGNOSIS — Z20822 Contact with and (suspected) exposure to covid-19: Secondary | ICD-10-CM

## 2019-11-25 NOTE — Telephone Encounter (Signed)
Pt sent for covid testing -he thinks he is getting a cold Schedule message sent to cancel tomorrow appt and r/s.

## 2019-11-26 ENCOUNTER — Inpatient Hospital Stay: Payer: Medicare Other

## 2019-11-26 ENCOUNTER — Inpatient Hospital Stay: Payer: Medicare Other | Admitting: Internal Medicine

## 2019-11-26 LAB — NOVEL CORONAVIRUS, NAA: SARS-CoV-2, NAA: NOT DETECTED

## 2019-11-27 ENCOUNTER — Telehealth: Payer: Self-pay | Admitting: Medical Oncology

## 2019-11-27 ENCOUNTER — Telehealth: Payer: Self-pay | Admitting: Internal Medicine

## 2019-11-27 NOTE — Telephone Encounter (Signed)
R/s apt per 12/21 sch message - unable to reach pt . Left message with appt date and time

## 2019-11-27 NOTE — Telephone Encounter (Signed)
Wants to reschedule all appts for next week. Schedule message sent.

## 2019-11-28 ENCOUNTER — Other Ambulatory Visit: Payer: Self-pay | Admitting: Medical Oncology

## 2019-11-28 ENCOUNTER — Other Ambulatory Visit: Payer: Self-pay | Admitting: Internal Medicine

## 2019-11-28 ENCOUNTER — Telehealth: Payer: Self-pay | Admitting: Internal Medicine

## 2019-11-28 ENCOUNTER — Other Ambulatory Visit: Payer: Medicare Other

## 2019-11-28 ENCOUNTER — Ambulatory Visit: Payer: Medicare Other | Admitting: Physician Assistant

## 2019-11-28 ENCOUNTER — Ambulatory Visit: Payer: Medicare Other

## 2019-11-28 ENCOUNTER — Telehealth: Payer: Self-pay | Admitting: Medical Oncology

## 2019-11-28 MED ORDER — HYDROCODONE-ACETAMINOPHEN 5-325 MG PO TABS: 1.0000 | ORAL_TABLET | Freq: Three times a day (TID) | ORAL | 0 refills | Status: DC | PRN

## 2019-11-28 NOTE — Telephone Encounter (Signed)
err

## 2019-11-28 NOTE — Telephone Encounter (Signed)
Scheduled appt per 12/24 sch message - pt is aware of appt date and time for 12/28

## 2019-11-28 NOTE — Telephone Encounter (Signed)
   Pain med requested-he is out . Pt notified that appts are being worked on .

## 2019-12-02 ENCOUNTER — Inpatient Hospital Stay: Payer: Medicare Other

## 2019-12-02 ENCOUNTER — Encounter: Payer: Self-pay | Admitting: Internal Medicine

## 2019-12-02 ENCOUNTER — Other Ambulatory Visit: Payer: Self-pay

## 2019-12-02 ENCOUNTER — Inpatient Hospital Stay: Payer: Medicare Other | Attending: Internal Medicine

## 2019-12-02 ENCOUNTER — Inpatient Hospital Stay (HOSPITAL_BASED_OUTPATIENT_CLINIC_OR_DEPARTMENT_OTHER): Payer: Medicare Other | Admitting: Internal Medicine

## 2019-12-02 VITALS — BP 130/76 | HR 95 | Temp 97.8°F | Resp 20 | Ht 71.0 in | Wt 146.9 lb

## 2019-12-02 DIAGNOSIS — C3431 Malignant neoplasm of lower lobe, right bronchus or lung: Secondary | ICD-10-CM | POA: Diagnosis present

## 2019-12-02 DIAGNOSIS — C3490 Malignant neoplasm of unspecified part of unspecified bronchus or lung: Secondary | ICD-10-CM | POA: Diagnosis not present

## 2019-12-02 DIAGNOSIS — Z5112 Encounter for antineoplastic immunotherapy: Secondary | ICD-10-CM

## 2019-12-02 DIAGNOSIS — Z79899 Other long term (current) drug therapy: Secondary | ICD-10-CM | POA: Diagnosis not present

## 2019-12-02 DIAGNOSIS — I10 Essential (primary) hypertension: Secondary | ICD-10-CM

## 2019-12-02 DIAGNOSIS — R5382 Chronic fatigue, unspecified: Secondary | ICD-10-CM

## 2019-12-02 LAB — CBC WITH DIFFERENTIAL (CANCER CENTER ONLY)
Abs Immature Granulocytes: 0.02 10*3/uL (ref 0.00–0.07)
Basophils Absolute: 0 10*3/uL (ref 0.0–0.1)
Basophils Relative: 1 %
Eosinophils Absolute: 0.1 10*3/uL (ref 0.0–0.5)
Eosinophils Relative: 1 %
HCT: 41.9 % (ref 39.0–52.0)
Hemoglobin: 14.2 g/dL (ref 13.0–17.0)
Immature Granulocytes: 0 %
Lymphocytes Relative: 14 %
Lymphs Abs: 0.9 10*3/uL (ref 0.7–4.0)
MCH: 31 pg (ref 26.0–34.0)
MCHC: 33.9 g/dL (ref 30.0–36.0)
MCV: 91.5 fL (ref 80.0–100.0)
Monocytes Absolute: 0.6 10*3/uL (ref 0.1–1.0)
Monocytes Relative: 9 %
Neutro Abs: 4.7 10*3/uL (ref 1.7–7.7)
Neutrophils Relative %: 75 %
Platelet Count: 259 10*3/uL (ref 150–400)
RBC: 4.58 MIL/uL (ref 4.22–5.81)
RDW: 14.3 % (ref 11.5–15.5)
WBC Count: 6.3 10*3/uL (ref 4.0–10.5)
nRBC: 0 % (ref 0.0–0.2)

## 2019-12-02 LAB — CMP (CANCER CENTER ONLY)
ALT: 11 U/L (ref 0–44)
AST: 17 U/L (ref 15–41)
Albumin: 3.6 g/dL (ref 3.5–5.0)
Alkaline Phosphatase: 116 U/L (ref 38–126)
Anion gap: 10 (ref 5–15)
BUN: 20 mg/dL (ref 8–23)
CO2: 24 mmol/L (ref 22–32)
Calcium: 9.2 mg/dL (ref 8.9–10.3)
Chloride: 101 mmol/L (ref 98–111)
Creatinine: 0.97 mg/dL (ref 0.61–1.24)
GFR, Est AFR Am: >60 mL/min (ref >60)
GFR, Estimated: >60 mL/min (ref >60)
Glucose, Bld: 114 mg/dL — ABNORMAL HIGH (ref 70–99)
Potassium: 4.2 mmol/L (ref 3.5–5.1)
Sodium: 135 mmol/L (ref 135–145)
Total Bilirubin: 0.6 mg/dL (ref 0.3–1.2)
Total Protein: 6.2 g/dL — ABNORMAL LOW (ref 6.5–8.1)

## 2019-12-02 LAB — TSH: TSH: 2.69 u[IU]/mL (ref 0.320–4.118)

## 2019-12-02 MED ORDER — SODIUM CHLORIDE 0.9 % IV SOLN
Freq: Once | INTRAVENOUS | Status: AC
  Filled 2019-12-02: qty 250

## 2019-12-02 MED ORDER — SODIUM CHLORIDE 0.9 % IV SOLN
480.0000 mg | Freq: Once | INTRAVENOUS | Status: AC
  Administered 2019-12-02: 480 mg via INTRAVENOUS
  Filled 2019-12-02: qty 48

## 2019-12-02 NOTE — Patient Instructions (Signed)
[facility] Discharge Instructions for Patients Receiving Chemotherapy  Today you received the following chemotherapy agents Nivolumab (OPDIVO).  To help prevent nausea and vomiting after your treatment, we encourage you to take your nausea medication as prescribed.   If you develop nausea and vomiting that is not controlled by your nausea medication, call the clinic.   BELOW ARE SYMPTOMS THAT SHOULD BE REPORTED IMMEDIATELY:  *FEVER GREATER THAN 100.5 F  *CHILLS WITH OR WITHOUT FEVER  NAUSEA AND VOMITING THAT IS NOT CONTROLLED WITH YOUR NAUSEA MEDICATION  *UNUSUAL SHORTNESS OF BREATH  *UNUSUAL BRUISING OR BLEEDING  TENDERNESS IN MOUTH AND THROAT WITH OR WITHOUT PRESENCE OF ULCERS  *URINARY PROBLEMS  *BOWEL PROBLEMS  UNUSUAL RASH Items with * indicate a potential emergency and should be followed up as soon as possible.  Feel free to call the clinic should you have any questions or concerns. The clinic phone number is [phone].  Please show the [facility] at check-in to the Emergency Department and triage nurse.  Coronavirus (COVID-19) Are you at risk?  Are you at risk for the Coronavirus (COVID-19)?  To be considered HIGH RISK for Coronavirus (COVID-19), you have to meet the following criteria:  . Traveled to [place], [place], [place], [place] or [place]; or in the [place] to [place], [place], [place], or [place]; and have fever, cough, and shortness of breath within the last 2 weeks of travel OR . Been in close contact with a person diagnosed with COVID-19 within the last 2 weeks and have fever, cough, and shortness of breath . IF YOU DO NOT MEET THESE CRITERIA, YOU ARE CONSIDERED LOW RISK FOR COVID-19.  What to do if you are HIGH RISK for COVID-19?  [name] If you are having a medical emergency, call 911. . Seek medical care right away. Before you go to a doctor's office, urgent care or emergency department, call ahead and tell them  about your recent travel, contact with someone diagnosed with COVID-19, and your symptoms. You should receive instructions from your physician's office regarding next steps of care.  . When you arrive at healthcare provider, tell the healthcare staff immediately you have returned from visiting [place], [place], [place], [place] or [place]; or traveled in the [place] to [place], [place], [place], or [place]; in the last two weeks or you have been in close contact with a person diagnosed with COVID-19 in the last 2 weeks.   . Tell the health care staff about your symptoms: fever, cough and shortness of breath. . After you have been seen by a medical provider, you will be either: o Tested for (COVID-19) and discharged home on quarantine except to seek medical care if symptoms worsen, and asked to  - Stay home and avoid contact with others until you get your results (4-5 days)  - Avoid travel on public transportation if possible (such as bus, train, or airplane) or o Sent to the Emergency Department by EMS for evaluation, COVID-19 testing, and possible admission depending on your condition and test results.  What to do if you are LOW RISK for COVID-19?  Reduce your risk of any infection by using the same precautions used for avoiding the common cold or flu:  [name] Wash your hands often with soap and warm water for at least 20 seconds.  If soap and water are not readily available, use an alcohol-based hand sanitizer with at least 60% alcohol.  . If coughing or  sneezing, cover your mouth and nose by coughing or sneezing into the elbow areas of your shirt or coat, into a tissue or into your sleeve (not your hands). . Avoid shaking hands with others and consider head nods or verbal greetings only. . Avoid touching your eyes, nose, or mouth with unwashed hands.  . Avoid close contact with people who are sick. . Avoid places or events with large numbers of people in one location, like concerts or  sporting events. . Carefully consider travel plans you have or are making. . If you are planning any travel outside or inside the [place], visit the CDC's Travelers' Health webpage for the latest health notices. . If you have some symptoms but not all symptoms, continue to monitor at home and seek medical attention if your symptoms worsen. . If you are having a medical emergency, call 911.   [place] / e-Visit: [email or web address]         [facility]: [facility]: [phone]                   [facility]: [phone]

## 2019-12-02 NOTE — Progress Notes (Signed)
Anthony Curry [phone]   [phone]  OFFICE PROGRESS NOTE  Anthony Dus, MD [place] [address]  DIAGNOSIS: Stage IV (T1a, N0, M1b) non-small cell lung cancer, adenocarcinoma of the right lower lobe diagnosed in October 2014  Molecular profile: Negative forRET, ALK, BRAF, MET, EGFR.  Positive for ERBB2 A775T, [id number], KRAS G13E, MAP2K1 D67N (see full report).   PRIOR THERAPY:  1) Systemic chemotherapy with carboplatin for AUC of 5 and Alimta 500 mg/M2 every 3 weeks, status post 6 cycles with stable disease. First dose on 10/23/2013. 2) Maintenance systemic chemotherapy with single agent Alimta 500 mg/M2 every 3 weeks. Status post 8 cycles.  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 Sondra Curry completed on 12/16/2014. 4) stereotactic radiotherapy to the enlarging right upper lobe lung nodule under the care of Dr. Sondra Curry completed on 11/22/2016. 5) Immunotherapy with Nivolumab 240 mg IV every 2 weeks, status post 60 cycles. First cycle was given on 01/28/2015. Last dose 07/05/2017.  CURRENT THERAPY: Immunotherapy with Nivolumab 480 mg IV every 4 weeks. First dose 07/19/2017. Status post 30 cycles.  INTERVAL HISTORY: Anthony Curry. 78 y.o. male returns to the clinic today for follow-up visit.  The patient is feeling fine today with no concerning complaints except for fatigue and the dizzy spells.  He was complaining of worsening fatigue and sore throat last week and his treatment was delayed.  He was tested for COVID-19 and it was negative.  The patient denied having any current chest pain, shortness of breath except with exertion with no cough or hemoptysis.  He denied having any fever or chills.  He has no nausea, vomiting, diarrhea or constipation.  He is here today for evaluation before starting cycle #31 of his treatment with nivolumab.     MEDICAL HISTORY: Past  Medical History:  Diagnosis Date  . 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 Adrenal mass (Anthony Curry)   . Alcoholism in remission (Anthony Curry)   . Anemia of chronic disease   . 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 Anxiety   . BPH (benign prostatic hyperplasia)   . Cancer (Anthony Curry)    small cell/lung  . Complication of anesthesia 09/08/2015    JITTERY AFTER GALLBLADDER SURGERY  . GERD (gastroesophageal reflux disease)   . History of radiation therapy 11/14/16, 11/18/16, 11/22/16   Left upper lung: 54 Gy in 3 fractions  . Hypertension   . Hypertension 12/07/2016  . Neoplastic malignant related fatigue    IV tx. every 2 weeks last9-14-16 (Dr. Earlie Curry), radiation last tx. 6 months  . 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 Persistent atrial fibrillation (Anthony Curry)   . Pulmonary nodules   . Radiation 12/02/14-12/16/14   Left adrenal gland 30 Gy in 10 fractions    ALLERGIES:  has No Known Allergies.  MEDICATIONS:  Current Outpatient Medications  Medication Sig Dispense Refill  . ALPRAZolam (XANAX) 0.5 MG tablet Take 0.5 mg by mouth at bedtime as needed.    [name] apixaban (ELIQUIS) 5 MG TABS tablet Take 5 mg by mouth 2 (two) times daily.    . Ascorbic Acid (VITAMIN C PO) Take 1,000 mg by mouth daily.     . B Complex-C (SUPER B COMPLEX PO) Take 1 each by mouth daily.     . calcium carbonate (OS-CAL) 600 MG TABS Take 600 mg by mouth daily with breakfast.    . demeclocycline (DECLOMYCIN)  150 MG tablet Take 2 tablets twice a day 120 tablet 2  . diltiazem (CARDIZEM CD) 180 MG 24 hr capsule Take 1 capsule (180 mg total) by mouth daily at 12 noon. 90 capsule 3  . doxycycline (VIBRAMYCIN) 100 MG capsule Take 100 mg by mouth 2 (two) times daily.    . fluticasone (FLONASE) 50 MCG/ACT nasal spray 2 sprays at bedtime.    [name] HYDROcodone-acetaminophen (NORCO/VICODIN) 5-325 MG tablet Take 1 tablet by mouth 3 (three) times daily as needed for moderate pain. 60 tablet 0  . KLOR-CON M20 20 MEQ tablet Take 20 mEq by  mouth every morning.     . Lactobacillus (ACIDOPHILUS) CAPS capsule Take 1 capsule by mouth daily.    [name] levothyroxine (SYNTHROID, LEVOTHROID) 50 MCG tablet TAKE 1 TABLET (50 MCG TOTAL) BY MOUTH DAILY BEFORE BREAKFAST. 90 tablet 0  . MAGNESIUM PO Take 400 mg by mouth daily.     . mirtazapine (REMERON) 30 MG tablet TAKE 1 TABLET(30 MG) BY MOUTH AT BEDTIME 30 tablet 2  . Multiple Vitamin (MULTIVITAMIN WITH MINERALS) TABS Take 1 tablet by mouth daily.    . Nivolumab (OPDIVO IV) Inject as directed every 2 weeks    . Nutritional Supplements (ENSURE COMPLETE PO) Take 1 Can by mouth 2 (two) times daily.    . Omega-3 Fatty Acids (FISH OIL PO) Take 1 tablet by mouth daily.    . tamsulosin (FLOMAX) 0.4 MG CAPS capsule Take 0.4 mg by mouth daily at 12 noon.     . thiamine (VITAMIN B-1) 100 MG tablet Take 1 tablet (100 mg total) by mouth daily. 30 tablet 6  . vitamin E 400 UNIT capsule Take 400 Units by mouth daily.     No current facility-administered medications for this visit.    SURGICAL HISTORY:  Past Surgical History:  Procedure Laterality Date  . ANKLE ARTHRODESIS  1995   rt fx-hardware in  . CARDIOVERSION N/A 09/29/2014   Procedure: CARDIOVERSION;  Surgeon: Josue Hector, MD;  Location: [facility] ENDOSCOPY;  Service: Cardiovascular;  Laterality: N/A;  . CHOLECYSTECTOMY  09/08/2015    laproscopic   . CHOLECYSTECTOMY N/A 09/08/2015   Procedure: LAPAROSCOPIC CHOLECYSTECTOMY WITH ATTEMPTED INTRAOPERATIVE CHOLANGIOGRAM ;  Surgeon: Fanny Skates, MD;  Location: [place];  Service: General;  Laterality: N/A;  . COLONOSCOPY    . ERCP N/A 09/02/2015   Procedure: ENDOSCOPIC RETROGRADE CHOLANGIOPANCREATOGRAPHY (ERCP);  Surgeon: Arta Silence, MD;  Location: [facility] ENDOSCOPY;  Service: Endoscopy;  Laterality: N/A;  . ERCP N/A 09/07/2015   Procedure: ENDOSCOPIC RETROGRADE CHOLANGIOPANCREATOGRAPHY (ERCP);  Surgeon: Clarene Essex, MD;  Location: [facility] ENDOSCOPY;  Service: Endoscopy;  Laterality: N/A;  . EXCISION / CURETTAGE  BONE CYST FINGER  2005   rt thumb  . HEMORRHOID SURGERY    . Percutaneous Cholecytostomy tube     IVR insertion 06-13-15(Dr. Vernard Gambles)- 08-28-15 remains in place to drainage bag  . TONSILLECTOMY    . TOTAL SHOULDER ARTHROPLASTY  02/03/2012   Procedure: TOTAL SHOULDER ARTHROPLASTY;  Surgeon: [name], MD;  Location: [place];  Service: Orthopedics;  Laterality: Left;    REVIEW OF SYSTEMS:  A comprehensive review of systems was negative except for: Constitutional: positive for fatigue Musculoskeletal: positive for arthralgias Neurological: positive for dizziness   PHYSICAL EXAMINATION: General appearance: alert, cooperative, fatigued and no distress Head: Normocephalic, without obvious abnormality, atraumatic Neck: no adenopathy, no JVD, supple, symmetrical, trachea midline and thyroid not enlarged, symmetric, no tenderness/mass/nodules Lymph nodes: Cervical, supraclavicular, and axillary nodes normal.  Resp: clear to auscultation bilaterally Back: symmetric, no curvature. ROM normal. No CVA tenderness. Cardio: regular rate and rhythm, S1, S2 normal, no murmur, click, rub or gallop GI: soft, non-tender; bowel sounds normal; no masses,  no organomegaly Extremities: extremities normal, atraumatic, no cyanosis or edema  ECOG PERFORMANCE STATUS: 1 - Symptomatic but completely ambulatory  Blood pressure 130/76, pulse 95, temperature 97.8 F (36.6 C), temperature source Temporal, resp. rate 20, height 5' 11"  (1.803 m), weight 146 lb 14.4 oz (66.6 kg), SpO2 96 %.  LABORATORY DATA: Lab Results  Component Value Date   WBC 6.3 12/02/2019   HGB 14.2 12/02/2019   HCT 41.9 12/02/2019   MCV 91.5 12/02/2019   PLT 259 12/02/2019      Chemistry      Component Value Date/Time   NA 135 10/29/2019 0939   NA 132 (L) 12/06/2017 0930   K 4.4 10/29/2019 0939   K 4.0 12/06/2017 0930   CL 101 10/29/2019 0939   CO2 25 10/29/2019 0939   CO2 26 12/06/2017 0930   BUN 23 10/29/2019  0939   BUN 15.4 12/06/2017 0930   CREATININE 0.98 10/29/2019 0939   CREATININE 0.9 12/06/2017 0930      Component Value Date/Time   CALCIUM 9.2 10/29/2019 0939   CALCIUM 9.9 12/06/2017 0930   ALKPHOS 118 10/29/2019 0939   ALKPHOS 107 12/06/2017 0930   AST 19 10/29/2019 0939   AST 20 12/06/2017 0930   ALT 13 10/29/2019 0939   ALT 14 12/06/2017 0930   BILITOT 0.6 10/29/2019 0939   BILITOT 0.76 12/06/2017 0930       RADIOGRAPHIC STUDIES: No results found.  ASSESSMENT AND PLAN:  This is a very pleasant 78 years old white male with metastatic non-small cell lung cancer, adenocarcinoma and currently on treatment with second line immunotherapy with Nivolumab status post 60 cycles and has been tolerating his treatment well. He was recently switched to Nivolumab 480 MG IV every 4 weeks status post 30 cycles. The patient continues to tolerate his treatment well with no concerning adverse effects. I recommended for him to proceed with cycle #31 today as planned. I will see him back for follow-up visit in 4 weeks for evaluation with repeat CT scan of the chest, abdomen pelvis for restaging of his disease. For the depression, he will continue on his treatment with Remeron 30 mg p.o. nightly. For the hyponatremia he is currently on demeclocycline and his sodium level is normal today. For the anxiety he will continue on Xanax. For the pain management the patient is currently on hydrocodone on as-needed basis. For the peripheral neuropathy and dizzy spells, he is scheduled to see neurology in the next few weeks. The patient was advised to call immediately if he has any concerning symptoms in the interval. The patient voices understanding of current disease status and treatment options and is in agreement with the current care plan. All questions were answered. The patient knows to call the clinic with any problems, questions or concerns. We can certainly see the patient much sooner if  necessary.  Disclaimer: This note was dictated with voice recognition software. Similar sounding words can inadvertently be transcribed and may not be corrected upon review.

## 2019-12-03 ENCOUNTER — Telehealth: Payer: Self-pay | Admitting: Internal Medicine

## 2019-12-03 NOTE — Telephone Encounter (Signed)
Scheduled per los. Date requested in los is different than treatment plan. Called and spoke with patient. Confirmed appts

## 2019-12-06 DIAGNOSIS — U071 COVID-19: Secondary | ICD-10-CM

## 2019-12-06 HISTORY — DX: COVID-19: U07.1

## 2019-12-20 ENCOUNTER — Ambulatory Visit: Payer: Medicare Other | Admitting: Podiatry

## 2019-12-26 ENCOUNTER — Telehealth: Payer: Self-pay | Admitting: Medical Oncology

## 2019-12-26 NOTE — Telephone Encounter (Signed)
COVID exposure-Wife has COVID and is hospitalized . Pt's CT scan for tomorrow will be delayed 14 days, unless pt COVID  test is negative. He will call today to schedule a COVID test for tomorrow. Labs cancelled for tomorrow.

## 2019-12-26 NOTE — Telephone Encounter (Addendum)
Appt - I told pt  not to come in for appts on Monday until we contact him . Pt has COVID test tomorrow . We will pend Monday's appt  until we get result.

## 2019-12-27 ENCOUNTER — Ambulatory Visit (HOSPITAL_COMMUNITY): Payer: Medicare Other

## 2019-12-27 ENCOUNTER — Ambulatory Visit: Payer: Medicare Other | Attending: Internal Medicine

## 2019-12-27 DIAGNOSIS — Z20822 Contact with and (suspected) exposure to covid-19: Secondary | ICD-10-CM | POA: Diagnosis not present

## 2019-12-28 LAB — NOVEL CORONAVIRUS, NAA: SARS-CoV-2, NAA: NOT DETECTED

## 2019-12-29 NOTE — Progress Notes (Deleted)
[facility] OFFICE PROGRESS NOTE  [name], MD [place] [address]  DIAGNOSIS: Stage IV (T1a, N0, M1b) non-small cell lung cancer, adenocarcinoma of the right lower lobe diagnosed in October 2014  Molecular profile: Negative forRET, ALK, BRAF, MET, EGFR.  Positive for ERBB2 A775T, UXL2G401U, KRAS G13E, MAP2K1 D67N (see full report).   PRIOR THERAPY:  1) Systemic chemotherapy with carboplatin for AUC of 5 and Alimta 500 mg/M2 every 3 weeks, status post 6 cycles with stable disease. First dose on 10/23/2013. 2) Maintenance systemic chemotherapy with single agent Alimta 500 mg/M2 every 3 weeks. Status post 8 cycles.  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 Sondra Come completed on 12/16/2014. 4) stereotactic radiotherapy to the enlarging right upper lobe lung nodule under the care of Dr. Sondra Come completed on 11/22/2016. 5) Immunotherapy with Nivolumab 240 mg IV every 2 weeks, status post 60 cycles. First cycle was given on 01/28/2015. Last dose 07/05/2017.  CURRENT THERAPY: Immunotherapy with Nivolumab 480 mg IV every 4 weeks. First dose 07/19/2017. Status post 31 cycles.  INTERVAL HISTORY: Eleanor Dimichele. 79 y.o. male returns to the clinic for a follow up visit. The patient continues to experience dizziness and fatigue. He is scheduled to see neurology for this concern on __. His wife is currently hospitalized with COVID-19. He was tested 2 days ago and is negative and denies any symptoms of fevers, chills, arthralgias/myalgias, sore throat, nasal congestion, headache, cough, shortness of breath, or loss of taste/smell. He was unable to have his restaging CT scan performed because of his exposure. He denies any nausea, vomiting, diarrhea, or constipation. He denies any visual changes. He is here for evaluation before starting cycle #32.    MEDICAL HISTORY: Past Medical History:  Diagnosis Date  .  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[place])   . Adrenal mass ([place])   . Alcoholism in remission ([place])   . Anemia of chronic disease   . 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 Anxiety   . BPH (benign prostatic hyperplasia)   . Cancer ([place])    small cell/lung  . Complication of anesthesia 09/08/2015    JITTERY AFTER GALLBLADDER SURGERY  . GERD (gastroesophageal reflux disease)   . History of radiation therapy 11/14/16, 11/18/16, 11/22/16   Left upper lung: 54 Gy in 3 fractions  . Hypertension   . Hypertension 12/07/2016  . Neoplastic malignant related fatigue    IV tx. every 2 weeks last9-14-16 (Dr. Earlie Server), radiation last tx. 6 months  . 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 Persistent atrial fibrillation ([place])   . Pulmonary nodules   . Radiation 12/02/14-12/16/14   Left adrenal gland 30 Gy in 10 fractions    ALLERGIES:  has No Known Allergies.  MEDICATIONS:  Current Outpatient Medications  Medication Sig Dispense Refill  . ALPRAZolam (XANAX) 0.5 MG tablet Take 0.5 mg by mouth at bedtime as needed.    [name] apixaban (ELIQUIS) 5 MG TABS tablet Take 5 mg by mouth 2 (two) times daily.    . Ascorbic Acid (VITAMIN C PO) Take 1,000 mg by mouth daily.     . B Complex-C (SUPER B COMPLEX PO) Take 1 each by mouth daily.     . calcium carbonate (OS-CAL) 600 MG TABS Take 600 mg by mouth daily with breakfast.    . demeclocycline (DECLOMYCIN) 150 MG tablet Take 2 tablets twice a day 120 tablet 2  . diltiazem (CARDIZEM CD)  180 MG 24 hr capsule Take 1 capsule (180 mg total) by mouth daily at 12 noon. 90 capsule 3  . doxycycline (VIBRAMYCIN) 100 MG capsule Take 100 mg by mouth 2 (two) times daily.    . fluticasone (FLONASE) 50 MCG/ACT nasal spray 2 sprays at bedtime.    [name] HYDROcodone-acetaminophen (NORCO/VICODIN) 5-325 MG tablet Take 1 tablet by mouth 3 (three) times daily as needed for moderate pain. 60 tablet 0  . KLOR-CON M20 20 MEQ tablet Take 20 mEq by mouth every morning.     . Lactobacillus  (ACIDOPHILUS) CAPS capsule Take 1 capsule by mouth daily.    [name] levothyroxine (SYNTHROID, LEVOTHROID) 50 MCG tablet TAKE 1 TABLET (50 MCG TOTAL) BY MOUTH DAILY BEFORE BREAKFAST. 90 tablet 0  . MAGNESIUM PO Take 400 mg by mouth daily.     . mirtazapine (REMERON) 30 MG tablet TAKE 1 TABLET(30 MG) BY MOUTH AT BEDTIME 30 tablet 2  . Multiple Vitamin (MULTIVITAMIN WITH MINERALS) TABS Take 1 tablet by mouth daily.    . Nivolumab (OPDIVO IV) Inject as directed every 2 weeks    . Nutritional Supplements (ENSURE COMPLETE PO) Take 1 Can by mouth 2 (two) times daily.    . Omega-3 Fatty Acids (FISH OIL PO) Take 1 tablet by mouth daily.    . tamsulosin (FLOMAX) 0.4 MG CAPS capsule Take 0.4 mg by mouth daily at 12 noon.     . thiamine (VITAMIN B-1) 100 MG tablet Take 1 tablet (100 mg total) by mouth daily. 30 tablet 6  . vitamin E 400 UNIT capsule Take 400 Units by mouth daily.     No current facility-administered medications for this visit.    SURGICAL HISTORY:  Past Surgical History:  Procedure Laterality Date  . ANKLE ARTHRODESIS  1995   rt fx-hardware in  . CARDIOVERSION N/A 09/29/2014   Procedure: CARDIOVERSION;  Surgeon: Josue Hector, MD;  Location: [facility] ENDOSCOPY;  Service: Cardiovascular;  Laterality: N/A;  . CHOLECYSTECTOMY  09/08/2015    laproscopic   . CHOLECYSTECTOMY N/A 09/08/2015   Procedure: LAPAROSCOPIC CHOLECYSTECTOMY WITH ATTEMPTED INTRAOPERATIVE CHOLANGIOGRAM ;  Surgeon: Fanny Skates, MD;  Location: [place];  Service: General;  Laterality: N/A;  . COLONOSCOPY    . ERCP N/A 09/02/2015   Procedure: ENDOSCOPIC RETROGRADE CHOLANGIOPANCREATOGRAPHY (ERCP);  Surgeon: Arta Silence, MD;  Location: [facility] ENDOSCOPY;  Service: Endoscopy;  Laterality: N/A;  . ERCP N/A 09/07/2015   Procedure: ENDOSCOPIC RETROGRADE CHOLANGIOPANCREATOGRAPHY (ERCP);  Surgeon: Clarene Essex, MD;  Location: [facility] ENDOSCOPY;  Service: Endoscopy;  Laterality: N/A;  . EXCISION / CURETTAGE BONE CYST FINGER  2005   rt thumb  .  HEMORRHOID SURGERY    . Percutaneous Cholecytostomy tube     IVR insertion 06-13-15(Dr. Vernard Gambles)- 08-28-15 remains in place to drainage bag  . TONSILLECTOMY    . TOTAL SHOULDER ARTHROPLASTY  02/03/2012   Procedure: TOTAL SHOULDER ARTHROPLASTY;  Surgeon: [name], MD;  Location: [facility];  Service: Orthopedics;  Laterality: Left;    REVIEW OF SYSTEMS:   Review of Systems  Constitutional: Negative for appetite change, chills, fatigue, fever and unexpected weight change.  HENT:   Negative for mouth sores, nosebleeds, sore throat and trouble swallowing.   Eyes: Negative for eye problems and icterus.  Respiratory: Negative for cough, hemoptysis, shortness of breath and wheezing.   Cardiovascular: Negative for chest pain and leg swelling.  Gastrointestinal: Negative for abdominal pain, constipation, diarrhea, nausea and vomiting.  Genitourinary: Negative for bladder incontinence, difficulty urinating,  dysuria, frequency and hematuria.   Musculoskeletal: Negative for back pain, gait problem, neck pain and neck stiffness.  Skin: Negative for itching and rash.  Neurological: Negative for dizziness, extremity weakness, gait problem, headaches, light-headedness and seizures.  Hematological: Negative for adenopathy. Does not bruise/bleed easily.  Psychiatric/Behavioral: Negative for confusion, depression and sleep disturbance. The patient is not nervous/anxious.     PHYSICAL EXAMINATION:  There were no vitals taken for this visit.  ECOG PERFORMANCE STATUS: {CHL ONC ECOG [id number]  Physical Exam  Constitutional: Oriented to person, place, and time and well-developed, well-nourished, and in no distress. No distress.  HENT:  Head: Normocephalic and atraumatic.  Mouth/Throat: Oropharynx is clear and moist. No oropharyngeal exudate.  Eyes: Conjunctivae are normal. Right eye exhibits no discharge. Left eye exhibits no discharge. No scleral icterus.  Neck: Normal range of  motion. Neck supple.  Cardiovascular: Normal rate, regular rhythm, normal heart sounds and intact distal pulses.   Pulmonary/Chest: Effort normal and breath sounds normal. No respiratory distress. No wheezes. No rales.  Abdominal: Soft. Bowel sounds are normal. Exhibits no distension and no mass. There is no tenderness.  Musculoskeletal: Normal range of motion. Exhibits no edema.  Lymphadenopathy:    No cervical adenopathy.  Neurological: Alert and oriented to person, place, and time. Exhibits normal muscle tone. Gait normal. Coordination normal.  Skin: Skin is warm and dry. No rash noted. Not diaphoretic. No erythema. No pallor.  Psychiatric: Mood, memory and judgment normal.  Vitals reviewed.  LABORATORY DATA: Lab Results  Component Value Date   WBC 6.3 12/02/2019   HGB 14.2 12/02/2019   HCT 41.9 12/02/2019   MCV 91.5 12/02/2019   PLT 259 12/02/2019      Chemistry      Component Value Date/Time   NA 135 12/02/2019 1134   NA 132 (L) 12/06/2017 0930   K 4.2 12/02/2019 1134   K 4.0 12/06/2017 0930   CL 101 12/02/2019 1134   CO2 24 12/02/2019 1134   CO2 26 12/06/2017 0930   BUN 20 12/02/2019 1134   BUN 15.4 12/06/2017 0930   CREATININE 0.97 12/02/2019 1134   CREATININE 0.9 12/06/2017 0930      Component Value Date/Time   CALCIUM 9.2 12/02/2019 1134   CALCIUM 9.9 12/06/2017 0930   ALKPHOS 116 12/02/2019 1134   ALKPHOS 107 12/06/2017 0930   AST 17 12/02/2019 1134   AST 20 12/06/2017 0930   ALT 11 12/02/2019 1134   ALT 14 12/06/2017 0930   BILITOT 0.6 12/02/2019 1134   BILITOT 0.76 12/06/2017 0930       RADIOGRAPHIC STUDIES:  No results found.   ASSESSMENT/PLAN:  This is a very pleasant 79 year old Caucasian male diagnosed with metastatic non-small cell lung cancer, adenocarcinoma of the right lower lobe. He was diagnosed in October 2014.  He has no actionable mutations.  The patient is currently undergoing second line immunotherapy with nivolumab. He is status  post 60 cycles of nivolumab IV every 2 weeks.  He was recently switched to nivolumab 480 milligrams IV every 4 weeks.  He is status post 31 cycles. He is tolerating it well without any adverse side effects.  The patient was seen with Dr. [name] today. Labs were reviewed. The patient takes demeclocycline for hyponatremia, of note his sodium is __ today. Recommend that he proceed with cycle #32 today as scheduled.   He will have his restaging CT scan performed on 2/8 as scheduled.   We will see him back for a follow  up visit in 4 weeks for evaluation before starting cycle #33.   Regarding the patient's history for cancer related pain as well as anxiety, I sent a refill for Xanax as well as Norco his pharmacy. The patient knows that these medications are to be used on an as needed bases only. He will continue remeron for his insomnia.   For the patient's history of hyponatremia, he will continue with demeclocycline. He also was encouraged to continue his 40 ounce fluid restriction as well as to slightly increase his salt intake in his diet.   The patient was advised to call immediately if he has any concerning symptoms in the interval. The patient voices understanding of current disease status and treatment options and is in agreement with the current care plan. All questions were answered. The patient knows to call the clinic with any problems, questions or concerns. We can certainly see the patient much sooner if necessary   No orders of the defined types were placed in this encounter.    [place], PA-C 12/29/19

## 2019-12-30 ENCOUNTER — Telehealth: Payer: Self-pay | Admitting: Medical Oncology

## 2019-12-30 ENCOUNTER — Inpatient Hospital Stay: Payer: No Typology Code available for payment source | Admitting: Physician Assistant

## 2019-12-30 ENCOUNTER — Inpatient Hospital Stay: Payer: No Typology Code available for payment source

## 2019-12-30 NOTE — Telephone Encounter (Signed)
No.  He can receive his treatment as planned and we will check the scan later.

## 2019-12-30 NOTE — Telephone Encounter (Signed)
Pt wants to postpone tx to later this week . he had a restless night , worried about his COVID test results and his wife may be coming home today. Denies fever, new cough ,SOB,  loss of taste/smell.  CT scan postponed. Per Radiology he has to complete a 14 day quarantine before  scan -which will be 2/8.   I cancelled appts today .

## 2019-12-31 ENCOUNTER — Telehealth: Payer: Self-pay | Admitting: Internal Medicine

## 2019-12-31 NOTE — Telephone Encounter (Signed)
Per Dr [name] and Dr Baxter Flattery, ID, appts for this week  cancelled due to potential COVID exposure on 12/26/19.  He needs covid test 5-7 days after exposure .   Pt can be rescheduled after his quarantine of 14 days.   Schedule message sent to r/s appts at cancer center. I called pt to schedule a COVID test.

## 2019-12-31 NOTE — Telephone Encounter (Signed)
I talk with patient regarding schedule  

## 2019-12-31 NOTE — Telephone Encounter (Signed)
Schedule message sent. 

## 2020-01-01 ENCOUNTER — Ambulatory Visit: Payer: Medicare Other | Attending: Internal Medicine

## 2020-01-01 DIAGNOSIS — Z20822 Contact with and (suspected) exposure to covid-19: Secondary | ICD-10-CM | POA: Diagnosis not present

## 2020-01-02 ENCOUNTER — Other Ambulatory Visit: Payer: Self-pay

## 2020-01-02 ENCOUNTER — Inpatient Hospital Stay: Payer: No Typology Code available for payment source | Admitting: Physician Assistant

## 2020-01-02 ENCOUNTER — Encounter (HOSPITAL_COMMUNITY): Payer: Self-pay

## 2020-01-02 ENCOUNTER — Telehealth: Payer: Self-pay | Admitting: Physician Assistant

## 2020-01-02 ENCOUNTER — Inpatient Hospital Stay (HOSPITAL_COMMUNITY)
Admission: EM | Admit: 2020-01-02 | Discharge: 2020-01-10 | DRG: 329 | Disposition: A | Discharge status: Skilled Nursing Facility | Payer: Medicare Other | Attending: Internal Medicine | Admitting: Internal Medicine

## 2020-01-02 ENCOUNTER — Inpatient Hospital Stay (HOSPITAL_COMMUNITY): Payer: Medicare Other

## 2020-01-02 ENCOUNTER — Inpatient Hospital Stay: Payer: No Typology Code available for payment source

## 2020-01-02 ENCOUNTER — Emergency Department (HOSPITAL_COMMUNITY): Payer: Medicare Other

## 2020-01-02 DIAGNOSIS — E669 Obesity, unspecified: Secondary | ICD-10-CM | POA: Diagnosis present

## 2020-01-02 DIAGNOSIS — Z792 Long term (current) use of antibiotics: Secondary | ICD-10-CM

## 2020-01-02 DIAGNOSIS — K650 Generalized (acute) peritonitis: Secondary | ICD-10-CM | POA: Diagnosis not present

## 2020-01-02 DIAGNOSIS — Z7401 Bed confinement status: Secondary | ICD-10-CM | POA: Diagnosis not present

## 2020-01-02 DIAGNOSIS — I749 Embolism and thrombosis of unspecified artery: Secondary | ICD-10-CM | POA: Diagnosis not present

## 2020-01-02 DIAGNOSIS — Z79890 Hormone replacement therapy (postmenopausal): Secondary | ICD-10-CM

## 2020-01-02 DIAGNOSIS — K5720 Diverticulitis of large intestine with perforation and abscess without bleeding: Secondary | ICD-10-CM | POA: Diagnosis not present

## 2020-01-02 DIAGNOSIS — R310 Gross hematuria: Secondary | ICD-10-CM | POA: Diagnosis not present

## 2020-01-02 DIAGNOSIS — K5792 Diverticulitis of intestine, part unspecified, without perforation or abscess without bleeding: Secondary | ICD-10-CM | POA: Diagnosis not present

## 2020-01-02 DIAGNOSIS — Z87891 Personal history of nicotine dependence: Secondary | ICD-10-CM

## 2020-01-02 DIAGNOSIS — I480 Paroxysmal atrial fibrillation: Secondary | ICD-10-CM | POA: Diagnosis present

## 2020-01-02 DIAGNOSIS — F419 Anxiety disorder, unspecified: Secondary | ICD-10-CM | POA: Diagnosis present

## 2020-01-02 DIAGNOSIS — Z96612 Presence of left artificial shoulder joint: Secondary | ICD-10-CM | POA: Diagnosis present

## 2020-01-02 DIAGNOSIS — R209 Unspecified disturbances of skin sensation: Secondary | ICD-10-CM | POA: Diagnosis not present

## 2020-01-02 DIAGNOSIS — R338 Other retention of urine: Secondary | ICD-10-CM | POA: Diagnosis present

## 2020-01-02 DIAGNOSIS — F329 Major depressive disorder, single episode, unspecified: Secondary | ICD-10-CM | POA: Diagnosis present

## 2020-01-02 DIAGNOSIS — K560 Paralytic ileus: Secondary | ICD-10-CM | POA: Diagnosis not present

## 2020-01-02 DIAGNOSIS — E039 Hypothyroidism, unspecified: Secondary | ICD-10-CM | POA: Diagnosis present

## 2020-01-02 DIAGNOSIS — F1021 Alcohol dependence, in remission: Secondary | ICD-10-CM | POA: Diagnosis present

## 2020-01-02 DIAGNOSIS — Z7901 Long term (current) use of anticoagulants: Secondary | ICD-10-CM

## 2020-01-02 DIAGNOSIS — I313 Pericardial effusion (noninflammatory): Secondary | ICD-10-CM | POA: Diagnosis not present

## 2020-01-02 DIAGNOSIS — K5732 Diverticulitis of large intestine without perforation or abscess without bleeding: Secondary | ICD-10-CM | POA: Diagnosis not present

## 2020-01-02 DIAGNOSIS — K219 Gastro-esophageal reflux disease without esophagitis: Secondary | ICD-10-CM | POA: Diagnosis not present

## 2020-01-02 DIAGNOSIS — Z833 Family history of diabetes mellitus: Secondary | ICD-10-CM

## 2020-01-02 DIAGNOSIS — Z6820 Body mass index (BMI) 20.0-20.9, adult: Secondary | ICD-10-CM

## 2020-01-02 DIAGNOSIS — I10 Essential (primary) hypertension: Secondary | ICD-10-CM | POA: Diagnosis present

## 2020-01-02 DIAGNOSIS — E871 Hypo-osmolality and hyponatremia: Secondary | ICD-10-CM | POA: Diagnosis not present

## 2020-01-02 DIAGNOSIS — U071 COVID-19: Secondary | ICD-10-CM | POA: Diagnosis not present

## 2020-01-02 DIAGNOSIS — I4819 Other persistent atrial fibrillation: Secondary | ICD-10-CM | POA: Diagnosis not present

## 2020-01-02 DIAGNOSIS — C3490 Malignant neoplasm of unspecified part of unspecified bronchus or lung: Secondary | ICD-10-CM | POA: Diagnosis not present

## 2020-01-02 DIAGNOSIS — R509 Fever, unspecified: Secondary | ICD-10-CM

## 2020-01-02 DIAGNOSIS — Z9221 Personal history of antineoplastic chemotherapy: Secondary | ICD-10-CM

## 2020-01-02 DIAGNOSIS — C3431 Malignant neoplasm of lower lobe, right bronchus or lung: Secondary | ICD-10-CM | POA: Diagnosis present

## 2020-01-02 DIAGNOSIS — J189 Pneumonia, unspecified organism: Secondary | ICD-10-CM | POA: Diagnosis not present

## 2020-01-02 DIAGNOSIS — J9601 Acute respiratory failure with hypoxia: Secondary | ICD-10-CM | POA: Diagnosis not present

## 2020-01-02 DIAGNOSIS — Z808 Family history of malignant neoplasm of other organs or systems: Secondary | ICD-10-CM

## 2020-01-02 DIAGNOSIS — D6859 Other primary thrombophilia: Secondary | ICD-10-CM | POA: Diagnosis present

## 2020-01-02 DIAGNOSIS — Z801 Family history of malignant neoplasm of trachea, bronchus and lung: Secondary | ICD-10-CM

## 2020-01-02 DIAGNOSIS — E222 Syndrome of inappropriate secretion of antidiuretic hormone: Secondary | ICD-10-CM | POA: Diagnosis present

## 2020-01-02 DIAGNOSIS — N401 Enlarged prostate with lower urinary tract symptoms: Secondary | ICD-10-CM | POA: Diagnosis present

## 2020-01-02 DIAGNOSIS — E441 Mild protein-calorie malnutrition: Secondary | ICD-10-CM | POA: Diagnosis not present

## 2020-01-02 DIAGNOSIS — Z48815 Encounter for surgical aftercare following surgery on the digestive system: Secondary | ICD-10-CM | POA: Diagnosis not present

## 2020-01-02 DIAGNOSIS — I742 Embolism and thrombosis of arteries of the upper extremities: Secondary | ICD-10-CM | POA: Diagnosis not present

## 2020-01-02 DIAGNOSIS — N400 Enlarged prostate without lower urinary tract symptoms: Secondary | ICD-10-CM | POA: Diagnosis not present

## 2020-01-02 DIAGNOSIS — R1110 Vomiting, unspecified: Secondary | ICD-10-CM | POA: Diagnosis not present

## 2020-01-02 DIAGNOSIS — F339 Major depressive disorder, recurrent, unspecified: Secondary | ICD-10-CM | POA: Diagnosis not present

## 2020-01-02 DIAGNOSIS — I4891 Unspecified atrial fibrillation: Secondary | ICD-10-CM | POA: Diagnosis present

## 2020-01-02 DIAGNOSIS — J439 Emphysema, unspecified: Secondary | ICD-10-CM | POA: Diagnosis present

## 2020-01-02 DIAGNOSIS — K5730 Diverticulosis of large intestine without perforation or abscess without bleeding: Secondary | ICD-10-CM | POA: Diagnosis not present

## 2020-01-02 DIAGNOSIS — Z79891 Long term (current) use of opiate analgesic: Secondary | ICD-10-CM

## 2020-01-02 DIAGNOSIS — Z9089 Acquired absence of other organs: Secondary | ICD-10-CM

## 2020-01-02 DIAGNOSIS — Z981 Arthrodesis status: Secondary | ICD-10-CM

## 2020-01-02 DIAGNOSIS — I482 Chronic atrial fibrillation, unspecified: Secondary | ICD-10-CM | POA: Diagnosis not present

## 2020-01-02 DIAGNOSIS — J1282 Pneumonia due to coronavirus disease 2019: Secondary | ICD-10-CM | POA: Diagnosis not present

## 2020-01-02 DIAGNOSIS — Z923 Personal history of irradiation: Secondary | ICD-10-CM

## 2020-01-02 DIAGNOSIS — Z8249 Family history of ischemic heart disease and other diseases of the circulatory system: Secondary | ICD-10-CM

## 2020-01-02 DIAGNOSIS — M2550 Pain in unspecified joint: Secondary | ICD-10-CM | POA: Diagnosis not present

## 2020-01-02 DIAGNOSIS — Z79899 Other long term (current) drug therapy: Secondary | ICD-10-CM

## 2020-01-02 DIAGNOSIS — I998 Other disorder of circulatory system: Secondary | ICD-10-CM | POA: Diagnosis not present

## 2020-01-02 DIAGNOSIS — R112 Nausea with vomiting, unspecified: Secondary | ICD-10-CM | POA: Diagnosis not present

## 2020-01-02 DIAGNOSIS — Z9049 Acquired absence of other specified parts of digestive tract: Secondary | ICD-10-CM

## 2020-01-02 DIAGNOSIS — R1032 Left lower quadrant pain: Secondary | ICD-10-CM | POA: Diagnosis present

## 2020-01-02 HISTORY — DX: Other pericardial effusion (noninflammatory): I31.39

## 2020-01-02 HISTORY — DX: Pericardial effusion (noninflammatory): I31.3

## 2020-01-02 LAB — CBC WITH DIFFERENTIAL/PLATELET
Abs Immature Granulocytes: 0.03 10*3/uL (ref 0.00–0.07)
Basophils Absolute: 0 10*3/uL (ref 0.0–0.1)
Basophils Relative: 0 %
Eosinophils Absolute: 0 10*3/uL (ref 0.0–0.5)
Eosinophils Relative: 0 %
HCT: 42.4 % (ref 39.0–52.0)
Hemoglobin: 14.6 g/dL (ref 13.0–17.0)
Immature Granulocytes: 0 %
Lymphocytes Relative: 10 %
Lymphs Abs: 0.8 10*3/uL (ref 0.7–4.0)
MCH: 30.7 pg (ref 26.0–34.0)
MCHC: 34.4 g/dL (ref 30.0–36.0)
MCV: 89.1 fL (ref 80.0–100.0)
Monocytes Absolute: 0.7 10*3/uL (ref 0.1–1.0)
Monocytes Relative: 8 %
Neutro Abs: 7 10*3/uL (ref 1.7–7.7)
Neutrophils Relative %: 82 %
Platelets: 222 10*3/uL (ref 150–400)
RBC: 4.76 MIL/uL (ref 4.22–5.81)
RDW: 13.9 % (ref 11.5–15.5)
WBC: 8.5 10*3/uL (ref 4.0–10.5)
nRBC: 0 % (ref 0.0–0.2)

## 2020-01-02 LAB — ECHOCARDIOGRAM COMPLETE
Height: 71 in
Weight: 2320 oz

## 2020-01-02 LAB — D-DIMER, QUANTITATIVE: D-Dimer, Quant: 0.99 ug/mL-FEU — ABNORMAL HIGH (ref 0.00–0.50)

## 2020-01-02 LAB — COMPREHENSIVE METABOLIC PANEL
ALT: 21 U/L (ref 0–44)
AST: 26 U/L (ref 15–41)
Albumin: 3.7 g/dL (ref 3.5–5.0)
Alkaline Phosphatase: 88 U/L (ref 38–126)
Anion gap: 9 (ref 5–15)
BUN: 15 mg/dL (ref 8–23)
CO2: 26 mmol/L (ref 22–32)
Calcium: 9 mg/dL (ref 8.9–10.3)
Chloride: 93 mmol/L — ABNORMAL LOW (ref 98–111)
Creatinine, Ser: 0.88 mg/dL (ref 0.61–1.24)
GFR calc Af Amer: >60 mL/min (ref >60)
GFR calc non Af Amer: >60 mL/min (ref >60)
Glucose, Bld: 127 mg/dL — ABNORMAL HIGH (ref 70–99)
Potassium: 4.2 mmol/L (ref 3.5–5.1)
Sodium: 128 mmol/L — ABNORMAL LOW (ref 135–145)
Total Bilirubin: 1.4 mg/dL — ABNORMAL HIGH (ref 0.3–1.2)
Total Protein: 6.5 g/dL (ref 6.5–8.1)

## 2020-01-02 LAB — URINALYSIS, ROUTINE W REFLEX MICROSCOPIC
Bilirubin Urine: NEGATIVE
Glucose, UA: NEGATIVE mg/dL
Hgb urine dipstick: NEGATIVE
Ketones, ur: NEGATIVE mg/dL
Leukocytes,Ua: NEGATIVE
Nitrite: NEGATIVE
Protein, ur: NEGATIVE mg/dL
Specific Gravity, Urine: 1.006 (ref 1.005–1.030)
pH: 9 — ABNORMAL HIGH (ref 5.0–8.0)

## 2020-01-02 LAB — SARS CORONAVIRUS 2 (TAT 6-24 HRS): SARS Coronavirus 2: POSITIVE — AB

## 2020-01-02 LAB — NOVEL CORONAVIRUS, NAA: SARS-CoV-2, NAA: DETECTED — AB

## 2020-01-02 LAB — C-REACTIVE PROTEIN: CRP: 13.1 mg/dL — ABNORMAL HIGH (ref <1.0)

## 2020-01-02 LAB — LIPASE, BLOOD: Lipase: 19 U/L (ref 11–51)

## 2020-01-02 LAB — FERRITIN: Ferritin: 132 ng/mL (ref 24–336)

## 2020-01-02 MED ORDER — DEMECLOCYCLINE HCL 150 MG PO TABS
150.0000 mg | ORAL_TABLET | Freq: Two times a day (BID) | ORAL | Status: DC
  Administered 2020-01-02 – 2020-01-10 (×16): 150 mg via ORAL
  Filled 2020-01-02 (×21): qty 1

## 2020-01-02 MED ORDER — PIPERACILLIN-TAZOBACTAM 3.375 G IVPB
3.3750 g | Freq: Three times a day (TID) | INTRAVENOUS | Status: DC
  Administered 2020-01-02 – 2020-01-10 (×24): 3.375 g via INTRAVENOUS
  Filled 2020-01-02 (×22): qty 50

## 2020-01-02 MED ORDER — ASCORBIC ACID 500 MG PO TABS
500.0000 mg | ORAL_TABLET | Freq: Two times a day (BID) | ORAL | Status: DC
  Administered 2020-01-02 – 2020-01-10 (×15): 500 mg via ORAL
  Filled 2020-01-02 (×15): qty 1

## 2020-01-02 MED ORDER — ONDANSETRON HCL 4 MG/2ML IJ SOLN: 4.0000 mg | Freq: Four times a day (QID) | INTRAMUSCULAR | Status: DC | PRN

## 2020-01-02 MED ORDER — DEXAMETHASONE SODIUM PHOSPHATE 10 MG/ML IJ SOLN
6.0000 mg | INTRAMUSCULAR | Status: DC
  Administered 2020-01-02: 6 mg via INTRAVENOUS
  Filled 2020-01-02: qty 1

## 2020-01-02 MED ORDER — CHLORHEXIDINE GLUCONATE 0.12 % MT SOLN
15.0000 mL | Freq: Two times a day (BID) | OROMUCOSAL | Status: DC
  Administered 2020-01-02 – 2020-01-10 (×15): 15 mL via OROMUCOSAL
  Filled 2020-01-02 (×15): qty 15

## 2020-01-02 MED ORDER — TAMSULOSIN HCL 0.4 MG PO CAPS
0.4000 mg | ORAL_CAPSULE | Freq: Every day | ORAL | Status: DC
  Administered 2020-01-02 – 2020-01-03 (×2): 0.4 mg via ORAL
  Filled 2020-01-02 (×2): qty 1

## 2020-01-02 MED ORDER — DILTIAZEM HCL ER COATED BEADS 180 MG PO CP24
180.0000 mg | ORAL_CAPSULE | Freq: Every day | ORAL | Status: DC
  Administered 2020-01-02 – 2020-01-10 (×9): 180 mg via ORAL
  Filled 2020-01-02 (×10): qty 1

## 2020-01-02 MED ORDER — SODIUM CHLORIDE 0.9 % IV SOLN: INTRAVENOUS | Status: DC

## 2020-01-02 MED ORDER — ALPRAZOLAM 0.5 MG PO TABS
0.5000 mg | ORAL_TABLET | Freq: Every evening | ORAL | Status: DC | PRN
  Administered 2020-01-02 – 2020-01-08 (×5): 0.5 mg via ORAL
  Filled 2020-01-02 (×5): qty 1

## 2020-01-02 MED ORDER — ORAL CARE MOUTH RINSE
15.0000 mL | Freq: Two times a day (BID) | OROMUCOSAL | Status: DC
  Administered 2020-01-02 – 2020-01-10 (×13): 15 mL via OROMUCOSAL

## 2020-01-02 MED ORDER — MIRTAZAPINE 15 MG PO TABS
30.0000 mg | ORAL_TABLET | Freq: Every day | ORAL | Status: DC
  Administered 2020-01-02 – 2020-01-09 (×7): 30 mg via ORAL
  Filled 2020-01-02 (×7): qty 2

## 2020-01-02 MED ORDER — ACETAMINOPHEN 325 MG PO TABS
650.0000 mg | ORAL_TABLET | Freq: Four times a day (QID) | ORAL | Status: DC | PRN
  Administered 2020-01-02 – 2020-01-05 (×2): 650 mg via ORAL
  Filled 2020-01-02 (×2): qty 2

## 2020-01-02 MED ORDER — MORPHINE SULFATE (PF) 2 MG/ML IV SOLN
2.0000 mg | INTRAVENOUS | Status: DC | PRN
  Administered 2020-01-02 – 2020-01-03 (×3): 2 mg via INTRAVENOUS
  Filled 2020-01-02 (×3): qty 1

## 2020-01-02 MED ORDER — HYDRALAZINE HCL 20 MG/ML IJ SOLN: 10.0000 mg | Freq: Four times a day (QID) | INTRAMUSCULAR | Status: DC | PRN

## 2020-01-02 MED ORDER — IOHEXOL 300 MG/ML  SOLN
100.0000 mL | Freq: Once | INTRAMUSCULAR | Status: AC | PRN
  Administered 2020-01-02: 13:00:00 100 mL via INTRAVENOUS

## 2020-01-02 MED ORDER — PIPERACILLIN-TAZOBACTAM 3.375 G IVPB 30 MIN
3.3750 g | Freq: Once | INTRAVENOUS | Status: AC
  Administered 2020-01-02: 15:00:00 3.375 g via INTRAVENOUS
  Filled 2020-01-02: qty 50

## 2020-01-02 MED ORDER — THIAMINE HCL 100 MG PO TABS
100.0000 mg | ORAL_TABLET | Freq: Every day | ORAL | Status: DC
  Administered 2020-01-02 – 2020-01-10 (×9): 100 mg via ORAL
  Filled 2020-01-02 (×9): qty 1

## 2020-01-02 MED ORDER — SODIUM CHLORIDE (PF) 0.9 % IJ SOLN
INTRAMUSCULAR | Status: AC
  Filled 2020-01-02: qty 50

## 2020-01-02 MED ORDER — FLUTICASONE PROPIONATE 50 MCG/ACT NA SUSP
2.0000 | Freq: Every day | NASAL | Status: DC
  Administered 2020-01-02 – 2020-01-09 (×7): 2 via NASAL
  Filled 2020-01-02: qty 16

## 2020-01-02 MED ORDER — APIXABAN 5 MG PO TABS
5.0000 mg | ORAL_TABLET | Freq: Two times a day (BID) | ORAL | Status: DC
  Administered 2020-01-02 – 2020-01-03 (×2): 5 mg via ORAL
  Filled 2020-01-02 (×3): qty 1

## 2020-01-02 MED ORDER — ZINC SULFATE 220 (50 ZN) MG PO CAPS
220.0000 mg | ORAL_CAPSULE | Freq: Every day | ORAL | Status: DC
  Administered 2020-01-03 – 2020-01-10 (×8): 220 mg via ORAL
  Filled 2020-01-02 (×8): qty 1

## 2020-01-02 MED ORDER — LEVOTHYROXINE SODIUM 50 MCG PO TABS
50.0000 ug | ORAL_TABLET | Freq: Every day | ORAL | Status: DC
  Administered 2020-01-03 – 2020-01-05 (×3): 50 ug via ORAL
  Filled 2020-01-02 (×3): qty 1

## 2020-01-02 NOTE — Progress Notes (Signed)
Pharmacy Antibiotic Note  Dellis Voght. is a 79 y.o. male presented to the ED on 01/02/2020 with c/o abdominal pain .  Abd CT showed "diverticulosis with acute diverticulitis the junction of the distal descending and sigmoid colon. No abscess or perforation."  Pharmacy was consulted to start zosyn for intra-abdominal infection.  - scr 0.88 (crcl~62)  Plan: - zosyn 3.375 gm IV q8h (infuse over 4 hrs) - with good renal function, pharmacy will sign off for abx.  Re-consult [place] if need further assistance. __________________________________  Height: 5\' 11"  (180.3 cm) Weight: 145 lb (65.8 kg) IBW/kg (Calculated) : 75.3  Temp (24hrs), Avg:99.2 F (37.3 C), Min:99.2 F (37.3 C), Max:99.2 F (37.3 C)  Recent Labs  Lab 01/02/20 1041  WBC 8.5  CREATININE 0.88    Estimated Creatinine Clearance: 62.3 mL/min (by C-G formula based on SCr of 0.88 mg/dL).    No Known Allergies   Thank you for allowing pharmacy to be a part of this patient's care.  Lynelle Doctor 01/02/2020 2:10 PM

## 2020-01-02 NOTE — Progress Notes (Signed)
  Echocardiogram 2D Echocardiogram has been performed.  Burnett Kanaris 01/02/2020, 4:14 PM

## 2020-01-02 NOTE — ED Triage Notes (Signed)
Pt presents with c/o lower abdominal pain. Pt is a cancer patient, pt reports he is on immunology for his cancer. Pt denies any vomiting or diarrhea.

## 2020-01-02 NOTE — Progress Notes (Signed)
Lab Core at [facility] called to inform this RN that this patient's lab test came back as positive for Covid-19.

## 2020-01-02 NOTE — H&P (Signed)
History and Physical    Anthony Curry. UMP:[id number] DOB: October 08, 1941 DOA: 01/02/2020  PCP: [name], MD   Patient coming from: Home    Chief Complaint: Abdominal pain, diarrhea  HPI: Anthony Curry. is a 79 y.o. male with past medical history significant of stage IV non-small cell lung cancer, SIADH, paroxysmal A. fib, hypothyroidism, depression, anxiety who presents from home for the evaluation of sudden onset of abdomen pain since yesterday.  Patient was apparently all right until yesterday, he went to sit down on his couch and felt sharp pain.Pain was  located mainly on the left lower quadrant.  He did not report of any nausea or vomiting.  He is having diarrhea.  No report of fever or chills at home.  He rates the pain as 7 /10.  Abdominal pain was better earlier but again came back. Patient has history of right non-small cell lung cancer and is currently on chemotherapy.  Follows with Dr. [name].  He lives with his wife and daughter.  He is functional and ambulatory at baseline.  His wife was tested positive for Covid and was admitted at [facility].  He tested negative. Patient seen and examined at the bedside in the emergency department.  Currently he is hemodynamically stable.  He just had diarrhea before my evaluation.   ED Course: CT abdomen done in the emergency department showed acute diverticulitis at the junction of distal descending and sigmoid colon.  No abscess or perforation.  Start on pain medicines, IV fluids, antibiotics.  Keep n.p.o.  Review of Systems: As per HPI otherwise 10 point review of systems negative.    Past Medical History:  Diagnosis Date  . 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 Adrenal mass ([place])   . Alcoholism in remission ([place])   . Anemia of chronic disease   . 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 Anxiety   . BPH (benign prostatic hyperplasia)   . Cancer ([place])    small cell/lung  . Complication of anesthesia 09/08/2015    JITTERY AFTER GALLBLADDER SURGERY  . GERD (gastroesophageal reflux disease)   . History of radiation therapy 11/14/16, 11/18/16, 11/22/16   Left upper lung: 54 Gy in 3 fractions  . Hypertension   . Hypertension 12/07/2016  . Neoplastic malignant related fatigue    IV tx. every 2 weeks last9-14-16 (Dr. Earlie Server), radiation last tx. 6 months  . 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 Persistent atrial fibrillation ([place])   . Pulmonary nodules   . Radiation 12/02/14-12/16/14   Left adrenal gland 30 Gy in 10 fractions    Past Surgical History:  Procedure Laterality Date  . ANKLE ARTHRODESIS  1995   rt fx-hardware in  . CARDIOVERSION N/A 09/29/2014   Procedure: CARDIOVERSION;  Surgeon: Josue Hector, MD;  Location: [facility] ENDOSCOPY;  Service: Cardiovascular;  Laterality: N/A;  . CHOLECYSTECTOMY  09/08/2015    laproscopic   . CHOLECYSTECTOMY N/A 09/08/2015   Procedure: LAPAROSCOPIC CHOLECYSTECTOMY WITH ATTEMPTED INTRAOPERATIVE CHOLANGIOGRAM ;  Surgeon: Fanny Skates, MD;  Location: [place];  Service: General;  Laterality: N/A;  . COLONOSCOPY    . ERCP N/A 09/02/2015   Procedure: ENDOSCOPIC RETROGRADE CHOLANGIOPANCREATOGRAPHY (ERCP);  Surgeon: Arta Silence, MD;  Location: [facility] ENDOSCOPY;  Service: Endoscopy;  Laterality: N/A;  . ERCP N/A 09/07/2015   Procedure: ENDOSCOPIC RETROGRADE CHOLANGIOPANCREATOGRAPHY (ERCP);  Surgeon: Clarene Essex, MD;  Location: [facility] ENDOSCOPY;  Service: Endoscopy;  Laterality: N/A;  . EXCISION / CURETTAGE BONE CYST FINGER  2005  rt thumb  . HEMORRHOID SURGERY    . Percutaneous Cholecytostomy tube     IVR insertion 06-13-15(Dr. Vernard Gambles)- 08-28-15 remains in place to drainage bag  . TONSILLECTOMY    . TOTAL SHOULDER ARTHROPLASTY  02/03/2012   Procedure: TOTAL SHOULDER ARTHROPLASTY;  Surgeon: [name], MD;  Location: [place];  Service: Orthopedics;  Laterality: Left;     reports that he quit smoking about 29 years ago. His smoking use included  cigarettes. He has a 60.00 pack-year smoking history. He has never used smokeless tobacco. He reports current alcohol use. He reports that he does not use drugs.  No Known Allergies  Family History  Problem Relation Age of Onset  . Lung cancer Mother   . Hypertension Father   . Heart attack Father   . Diabetes Paternal Grandmother   . Brain cancer Brother   . Heart attack Brother   . Neuropathy Neg Hx      Prior to Admission medications   Medication Sig Start Date End Date Taking? Authorizing Provider  ALPRAZolam Duanne Moron) 0.5 MG tablet Take 0.5 mg by mouth at bedtime as needed. 09/24/19  Yes [provider]  apixaban (ELIQUIS) 5 MG TABS tablet Take 5 mg by mouth 2 (two) times daily.   Yes [provider]  Ascorbic Acid (VITAMIN C PO) Take 1,000 mg by mouth daily.    Yes [provider]  B Complex-C (SUPER B COMPLEX PO) Take 1 each by mouth daily.    Yes [provider]  calcium carbonate (OS-CAL) 600 MG TABS Take 600 mg by mouth daily with breakfast.   Yes [provider]  demeclocycline (DECLOMYCIN) 150 MG tablet Take 2 tablets twice a day Patient taking differently: Take 150 mg by mouth 2 (two) times daily.  07/09/19  Yes Heilingoetter, Cassandra L, PA-C  diltiazem (CARDIZEM CD) 180 MG 24 hr capsule Take 1 capsule (180 mg total) by mouth daily at 12 noon. 01/09/15  Yes Sherran Needs, NP  doxycycline (VIBRAMYCIN) 100 MG capsule Take 100 mg by mouth 2 (two) times daily. 10/26/19  Yes [provider]  fluticasone (FLONASE) 50 MCG/ACT nasal spray Place 2 sprays into both nostrils at bedtime.  10/17/19  Yes [provider]  HYDROcodone-acetaminophen (NORCO/VICODIN) 5-325 MG tablet Take 1 tablet by mouth 3 (three) times daily as needed for moderate pain. 11/28/19  Yes [name], MD  KLOR-CON M20 20 MEQ tablet Take 20 mEq by mouth every morning.  10/18/14  Yes [provider]  Lactobacillus (ACIDOPHILUS) CAPS  capsule Take 1 capsule by mouth daily.   Yes [provider]  levothyroxine (SYNTHROID, LEVOTHROID) 50 MCG tablet TAKE 1 TABLET (50 MCG TOTAL) BY MOUTH DAILY BEFORE BREAKFAST. 03/24/18  Yes [name], MD  MAGNESIUM PO Take 400 mg by mouth daily.    Yes [provider]  mirtazapine (REMERON) 30 MG tablet TAKE 1 TABLET(30 MG) BY MOUTH AT BEDTIME Patient taking differently: Take 30 mg by mouth at bedtime.  11/28/19  Yes [name], MD  Multiple Vitamin (MULTIVITAMIN WITH MINERALS) TABS Take 1 tablet by mouth daily.   Yes [provider]  Nivolumab (OPDIVO IV) Inject as directed every 30 (thirty) days. Inject as directed every 2 weeks   Yes [provider]  Nutritional Supplements (ENSURE COMPLETE PO) Take 1 Can by mouth 2 (two) times daily.   Yes [provider]  Omega-3 Fatty Acids (FISH OIL PO) Take 1 tablet by mouth daily.  Yes [provider]  tamsulosin (FLOMAX) 0.4 MG CAPS capsule Take 0.4 mg by mouth daily at 12 noon.    Yes [provider]  thiamine (VITAMIN B-1) 100 MG tablet Take 1 tablet (100 mg total) by mouth daily. 09/11/13  Yes Erick Colace, NP  vitamin E 400 UNIT capsule Take 400 Units by mouth daily.   Yes [provider]    Physical Exam: Vitals:   01/02/20 1017 01/02/20 1143 01/02/20 1200 01/02/20 1326  BP: (!) 140/109 (!) 147/66 (!) 152/65 (!) 152/65  Pulse: 97 74 73 73  Resp: 17 16  16   Temp: 99.2 F (37.3 C)     TempSrc: Oral     SpO2: 99% 98% 96% 96%  Weight: 65.8 kg     Height: 5\' 11"  (1.803 m)       Constitutional: Not in significant distress, pleasant elderly male Vitals:   01/02/20 1017 01/02/20 1143 01/02/20 1200 01/02/20 1326  BP: (!) 140/109 (!) 147/66 (!) 152/65 (!) 152/65  Pulse: 97 74 73 73  Resp: 17 16  16   Temp: 99.2 F (37.3 C)     TempSrc: Oral     SpO2: 99% 98% 96% 96%  Weight: 65.8 kg     Height: 5\' 11"  (1.803 m)      Eyes: PERRL, lids and conjunctivae  normal Neck: normal, supple, no masses, no thyromegaly Respiratory: clear to auscultation bilaterally, no wheezing, no crackles. Normal respiratory effort. No accessory muscle use.  Cardiovascular: Regular rate and rhythm, no murmurs / rubs / gallops. No extremity edema. Abdomen: Severe tenderness on the left lower quadrant, , no masses palpated. No hepatosplenomegaly. Bowel sounds positive.  Musculoskeletal: no clubbing / cyanosis. No joint deformity upper and lower extremities. Good ROM, no contractures. Normal muscle tone.  Skin: no rashes, lesions, ulcers. No induration Neurologic: CN 2-12 grossly intact. Strength 5/5 in all 4.  Psychiatric: Normal judgment and insight. Alert and oriented x 3. Normal mood.   Foley Catheter:None  Labs on Admission: I have personally reviewed following labs and imaging studies  CBC: Recent Labs  Lab 01/02/20 1041  WBC 8.5  NEUTROABS 7.0  HGB 14.6  HCT 42.4  MCV 89.1  PLT 885   Basic Metabolic Panel: Recent Labs  Lab 01/02/20 1041  NA 128*  K 4.2  CL 93*  CO2 26  GLUCOSE 127*  BUN 15  CREATININE 0.88  CALCIUM 9.0   GFR: Estimated Creatinine Clearance: 62.3 mL/min (by C-G formula based on SCr of 0.88 mg/dL). Liver Function Tests: Recent Labs  Lab 01/02/20 1041  AST 26  ALT 21  ALKPHOS 88  BILITOT 1.4*  PROT 6.5  ALBUMIN 3.7   Recent Labs  Lab 01/02/20 1041  LIPASE 19   No results for input(s): AMMONIA in the last 168 hours. Coagulation Profile: No results for input(s): INR, PROTIME in the last 168 hours. Cardiac Enzymes: No results for input(s): CKTOTAL, CKMB, CKMBINDEX, TROPONINI in the last 168 hours. BNP (last 3 results) No results for input(s): PROBNP in the last 8760 hours. HbA1C: No results for input(s): HGBA1C in the last 72 hours. CBG: No results for input(s): GLUCAP in the last 168 hours. Lipid Profile: No results for input(s): CHOL, HDL, LDLCALC, TRIG, CHOLHDL, LDLDIRECT in the last 72 hours. Thyroid  Function Tests: No results for input(s): TSH, T4TOTAL, FREET4, T3FREE, THYROIDAB in the last 72 hours. Anemia Panel: No results for input(s): VITAMINB12, FOLATE, FERRITIN, TIBC, IRON, RETICCTPCT in the last 72 hours.  Urine analysis:    Component Value Date/Time   COLORURINE YELLOW 01/02/2020 1227   APPEARANCEUR CLEAR 01/02/2020 1227   LABSPEC 1.006 01/02/2020 1227   PHURINE 9.0 (H) 01/02/2020 1227   GLUCOSEU NEGATIVE 01/02/2020 1227   HGBUR NEGATIVE 01/02/2020 1227   Experiment 01/02/2020 [place] 01/02/2020 1227   PROTEINUR NEGATIVE 01/02/2020 1227   UROBILINOGEN 0.2 09/10/2015 0940   NITRITE NEGATIVE 01/02/2020 1227   LEUKOCYTESUR NEGATIVE 01/02/2020 1227    Radiological Exams on Admission: CT ABDOMEN PELVIS W CONTRAST  Result Date: 01/02/2020 CLINICAL DATA:  Lower abdominal pain. The patient is on immunotherapy for lung carcinoma. EXAM: CT ABDOMEN AND PELVIS WITH CONTRAST TECHNIQUE: Multidetector CT imaging of the abdomen and pelvis was performed using the standard protocol following bolus administration of intravenous contrast. CONTRAST:  100 mL OMNIPAQUE IOHEXOL 300 MG/ML  SOLN COMPARISON:  CT chest, abdomen and pelvis 05/13/2019. FINDINGS: Lower chest: No pleural effusion. Small pericardial effusion is unchanged. Lung bases are emphysematous. There is mild dependent atelectasis. Hepatobiliary: A few small low attenuating lesions in the liver likely representing cysts are unchanged. Status post cholecystectomy. Biliary tree unremarkable. Pancreas: Unremarkable. No pancreatic ductal dilatation or surrounding inflammatory changes. Spleen: Normal in size without focal abnormality. 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 The left adrenal gland appears normal. There is some scarring in the right kidney which is unchanged. The kidneys otherwise appear normal. No stones or hydronephrosis. The urinary bladder is distended but otherwise  unremarkable. Stomach/Bowel: Diverticulosis is identified. There is thickening and stranding near the junction of the descending and sigmoid consistent with acute diverticulitis. No abscess or perforation. The stomach and small bowel appear normal. The appendix is normal in appearance. Note is made that the cecum is positioned in the right upper quadrant. Vascular/Lymphatic: Aortic atherosclerosis. No enlarged abdominal or pelvic lymph nodes. Reproductive: Prostate is unremarkable. Other: None. Musculoskeletal: No acute or focal abnormality. Severe degenerative change throughout the visualized spine is noted. IMPRESSION: Diverticulosis with acute diverticulitis the junction of the distal descending and sigmoid colon. No abscess or perforation. No change in a moderate pericardial effusion. Emphysema. Extensive atherosclerosis. Electronically Signed   By: Inge Rise M.D.   On: 01/02/2020 13:10     Assessment/Plan Principal Problem:   Diverticulitis Active Problems:   Hyponatremia   Atrial fibrillation (HCC)   Hypertension   SIADH (syndrome of inappropriate ADH production) ([place])   Acute diverticulitis    Acute diverticulitis: CT abdomen showed acute diverticulitis at the junction of distal descending and sigmoid colon.  No abscess or perforation.  No history of diverticulitis in the past.  Presented with abdomen pain, diarrhea. We will keep him n.p.o.  Started on a IV fluids, antibiotics.  Pain management.  Can try clear liquid diet if improvement in the abdominal pain tomorrow morning and then can advance diet as tolerated.  Hyponatremia: Chronic.  Symptomatic.  Most likely associated  with SIADH related to lung cancer.  His baseline sodium is 128-130.  He takes demeclocycline which will be continued.  Check BMP tomorrow.  Paroxysmal A. fib: Currently in normal sinus rhythm.  On Eliquis for anticoagulation.  Takes Cardizem for rate control  Hypertension: Hypertensive in the emergency  department.  Monitor blood pressure.  Home medications resumed.  Pericardial effusion: CT scan showed mild to moderate pericardial effusion.  Most likely this is associated with his malignancy.  Patient is asymptomatic.  Blood pressure stable.  We will check echocardiogram.  Anxiety/depression: Resume home Xanax  and mirtazapine.  BPH: Continue tamsulosin  Hypothyroidism: Continue Synthyroid      Severity of Illness: The appropriate patient status for this patient is INPATIENT. Inpatient status is judged to be reasonable and necessary in order to provide the required intensity of service to ensure the patient's safety. The patient's presenting symptoms, physical exam findings, and initial radiographic and laboratory data in the context of their chronic comorbidities is felt to place them at high risk for further clinical deterioration. Furthermore, it is not anticipated that the patient will be medically stable for discharge from the hospital within 2 midnights of admission. The following factors support the patient status of inpatient.   " The patient's presenting symptoms include severe abdominal pain " The worrisome physical exam findings include abdomen tenderness. " The initial radiographic and laboratory data are worrisome because of diverticulitis, needs IV fluids and antibiotics " The chronic co-morbidities include stage IV lung cancer.   * I certify that at the point of admission it is my clinical judgment that the patient will require inpatient hospital care spanning beyond 2 midnights from the point of admission due to high intensity of service, high risk for further deterioration and high frequency of surveillance required.*    DVT prophylaxis: Lovenox Code Status: Full code Family Communication: Discussed with daughter on phone Consults called: None     Shelly Coss MD [facility] Pager [phone]  If 7PM-7AM, please contact night-coverage [email or web address]  Password [facility]  01/02/2020, 2:09 PM

## 2020-01-02 NOTE — ED Provider Notes (Signed)
[facility] DEPT Provider Note   CSN: [id number] Arrival date & time: 01/02/20  1003     History Chief Complaint  Patient presents with  . Abdominal Pain    Anthony Curry. is a 79 y.o. male.  HPI   Patient presents emergency room for evaluation of left lower quadrant pain.  Patient states he started noticing the symptoms yesterday.  He went to sit down on the couch and felt a sharp pain in the left lower quadrant of his abdomen.  The pain dissipated but he has noticed ever since yesterday that whenever he touches a spot in his left lower quadrant he has a very sharp intense pain.  Denies any trouble with any fevers.  No dysuria.  No vomiting, diarrhea or constipation.  When he is not touching that area general is not hurting too much.  Patient denies having similar symptoms to this in the past.  He does have a history of lung cancer and his last chemo treatment was last month.  Past Medical History:  Diagnosis Date  . 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 Adrenal mass ([place])   . Alcoholism in remission ([place])   . Anemia of chronic disease   . 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 Anxiety   . BPH (benign prostatic hyperplasia)   . Cancer ([place])    small cell/lung  . Complication of anesthesia 09/08/2015    JITTERY AFTER GALLBLADDER SURGERY  . GERD (gastroesophageal reflux disease)   . History of radiation therapy 11/14/16, 11/18/16, 11/22/16   Left upper lung: 54 Gy in 3 fractions  . Hypertension   . Hypertension 12/07/2016  . Neoplastic malignant related fatigue    IV tx. every 2 weeks last9-14-16 (Dr. Earlie Server), radiation last tx. 6 months  . 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 Persistent atrial fibrillation ([place])   . Pulmonary nodules   . Radiation 12/02/14-12/16/14   Left adrenal gland 30 Gy in 10 fractions    Patient Active Problem List   Diagnosis Date Noted  . Ataxia 06/30/2019  . 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 Generalized weakness 06/28/2019  . Lactic acidosis 06/28/2019  . Abnormal urinalysis 06/28/2019  . Hypertension 12/07/2016  . 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 Acute urinary retention 09/09/2015  . Choledocholithiasis with cholecystitis 09/08/2015  . Common bile duct stone   . Encounter for antineoplastic immunotherapy 06/28/2015  . Atrial fibrillation ([place]) 06/11/2015  . Gynecomastia, male 06/10/2015  . Dizziness 05/11/2015  . Malignant neoplasm of lower lobe of right lung ([place]) 09/17/2014  . Neutropenic fever ([place]) 09/02/2014  . Neoplastic malignant related fatigue 08/13/2014  . Physical deconditioning 08/13/2014  . Hypoalbuminemia 08/13/2014  . Diarrhea 08/13/2014  . Antineoplastic chemotherapy induced anemia(285.3) 08/13/2014  . Atrial fibrillation with RVR ([place]) 07/28/2014  . HCAP (healthcare-associated pneumonia) 07/25/2014  . Alcoholism in remission ([place]) 07/25/2014  . Atrial flutter by electrocardiogram ([place]) 07/25/2014  . Swelling of limb 06/10/2014  . Lung cancer ([place]) 10/15/2013  . Hypotension 09/10/2013  . Adrenal mass ([place]) 09/09/2013  . 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 Pulmonary nodules 08/19/2013  . Anemia of chronic disease 07/03/2013  . BPH (benign prostatic hyperplasia) 07/03/2013  . GERD (gastroesophageal reflux disease) 07/03/2013  . Fall 07/03/2013  . Alcohol intoxication ([place]) 07/03/2013  . 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 Alcohol abuse 10/15/2011  . Hyponatremia 10/15/2011    Past Surgical History:  Procedure Laterality Date  . ANKLE ARTHRODESIS  1995   rt fx-hardware in  . CARDIOVERSION N/A 09/29/2014   Procedure: CARDIOVERSION;  Surgeon: Josue Hector, MD;  Location: [facility] ENDOSCOPY;  Service: Cardiovascular;  Laterality: N/A;  . CHOLECYSTECTOMY  09/08/2015    laproscopic   . CHOLECYSTECTOMY N/A 09/08/2015   Procedure: LAPAROSCOPIC CHOLECYSTECTOMY WITH ATTEMPTED  INTRAOPERATIVE CHOLANGIOGRAM ;  Surgeon: Fanny Skates, MD;  Location: [facility];  Service: General;  Laterality: N/A;  . COLONOSCOPY    . ERCP N/A 09/02/2015   Procedure: ENDOSCOPIC RETROGRADE CHOLANGIOPANCREATOGRAPHY (ERCP);  Surgeon: Arta Silence, MD;  Location: [facility] ENDOSCOPY;  Service: Endoscopy;  Laterality: N/A;  . ERCP N/A 09/07/2015   Procedure: ENDOSCOPIC RETROGRADE CHOLANGIOPANCREATOGRAPHY (ERCP);  Surgeon: Clarene Essex, MD;  Location: [facility] ENDOSCOPY;  Service: Endoscopy;  Laterality: N/A;  . EXCISION / CURETTAGE BONE CYST FINGER  2005   rt thumb  . HEMORRHOID SURGERY    . Percutaneous Cholecytostomy tube     IVR insertion 06-13-15(Dr. Vernard Gambles)- 08-28-15 remains in place to drainage bag  . TONSILLECTOMY    . TOTAL SHOULDER ARTHROPLASTY  02/03/2012   Procedure: TOTAL SHOULDER ARTHROPLASTY;  Surgeon: [name], MD;  Location: [facility];  Service: Orthopedics;  Laterality: Left;       Family History  Problem Relation Age of Onset  . Lung cancer Mother   . Hypertension Father   . Heart attack Father   . Diabetes Paternal Grandmother   . Brain cancer Brother   . Heart attack Brother   . Neuropathy Neg Hx     Social History   Tobacco Use  . Smoking status: Former Smoker    Packs/day: 2.00    Years: 30.00    Pack years: 60.00    Types: Cigarettes    Quit date: 12/05/1990    Years since quitting: 29.0  . Smokeless tobacco: Never Used  Substance Use Topics  . Alcohol use: Yes    Alcohol/week: 0.0 standard drinks    Comment: past hx.ETOH abuse,08-31-15 "occ. wine ,drinks non alcoholic beer occ."  . Drug use: No    Home Medications Prior to Admission medications   Medication Sig Start Date End Date Taking? Authorizing Provider  ALPRAZolam Duanne Moron) 0.5 MG tablet Take 0.5 mg by mouth at bedtime as needed. 09/24/19  Yes [provider]  apixaban (ELIQUIS) 5 MG TABS tablet Take 5 mg by mouth 2 (two) times daily.   Yes [provider]  Ascorbic  Acid (VITAMIN C PO) Take 1,000 mg by mouth daily.    Yes [provider]  B Complex-C (SUPER B COMPLEX PO) Take 1 each by mouth daily.    Yes [provider]  calcium carbonate (OS-CAL) 600 MG TABS Take 600 mg by mouth daily with breakfast.   Yes [provider]  demeclocycline (DECLOMYCIN) 150 MG tablet Take 2 tablets twice a day Patient taking differently: Take 150 mg by mouth 2 (two) times daily.  07/09/19  Yes Heilingoetter, Cassandra L, PA-C  diltiazem (CARDIZEM CD) 180 MG 24 hr capsule Take 1 capsule (180 mg total) by mouth daily at 12 noon. 01/09/15  Yes Sherran Needs, NP  doxycycline (VIBRAMYCIN) 100 MG capsule Take 100 mg by mouth 2 (two) times daily. 10/26/19  Yes [provider]  fluticasone (FLONASE) 50 MCG/ACT nasal spray Place 2 sprays into both nostrils at bedtime.  10/17/19  Yes [provider]  HYDROcodone-acetaminophen (NORCO/VICODIN) 5-325 MG tablet Take 1 tablet by mouth 3 (three) times daily as needed for moderate pain. 11/28/19  Yes [name], MD  KLOR-CON M20 20 MEQ tablet Take 20 mEq by mouth every morning.  10/18/14  Yes [provider]  Lactobacillus (ACIDOPHILUS) CAPS capsule Take 1 capsule by mouth daily.   Yes [provider]  levothyroxine (SYNTHROID, LEVOTHROID) 50 MCG tablet TAKE 1 TABLET (50 MCG TOTAL) BY MOUTH DAILY BEFORE BREAKFAST. 03/24/18  Yes [name], MD  MAGNESIUM PO Take 400 mg by mouth daily.    Yes [provider]  mirtazapine (REMERON) 30 MG tablet TAKE 1 TABLET(30 MG) BY MOUTH AT BEDTIME Patient taking differently: Take 30 mg by mouth at bedtime.  11/28/19  Yes [name], MD  Multiple Vitamin (MULTIVITAMIN WITH MINERALS) TABS Take 1 tablet by mouth daily.   Yes [provider]  Nivolumab (OPDIVO IV) Inject as directed every 30 (thirty) days. Inject as directed every 2 weeks   Yes [provider]  Nutritional Supplements (ENSURE COMPLETE PO) Take  1 Can by mouth 2 (two) times daily.   Yes [provider]  Omega-3 Fatty Acids (FISH OIL PO) Take 1 tablet by mouth daily.   Yes [provider]  tamsulosin (FLOMAX) 0.4 MG CAPS capsule Take 0.4 mg by mouth daily at 12 noon.    Yes [provider]  thiamine (VITAMIN B-1) 100 MG tablet Take 1 tablet (100 mg total) by mouth daily. 09/11/13  Yes Erick Colace, NP  vitamin E 400 UNIT capsule Take 400 Units by mouth daily.   Yes [provider]    Allergies    Patient has no known allergies.  Review of Systems   Review of Systems  All other systems reviewed and are negative.   Physical Exam Updated Vital Signs BP (!) 152/65   Pulse 73   Temp 99.2 F (37.3 C) (Oral)   Resp 16   Ht 1.803 m (5\' 11" )   Wt 65.8 kg   SpO2 96%   BMI 20.22 kg/m   Physical Exam Vitals and nursing note reviewed.  Constitutional:      General: He is not in acute distress.    Appearance: He is well-developed.  HENT:     Head: Normocephalic and atraumatic.     Right Ear: External ear normal.     Left Ear: External ear normal.  Eyes:     General: No scleral icterus.       Right eye: No discharge.        Left eye: No discharge.     Conjunctiva/sclera: Conjunctivae normal.  Neck:     Trachea: No tracheal deviation.  Cardiovascular:     Rate and Rhythm: Normal rate and regular rhythm.  Pulmonary:     Effort: Pulmonary effort is normal. No respiratory distress.     Breath sounds: Normal breath sounds. No stridor. No wheezing or rales.  Abdominal:     General: Bowel sounds are normal. There is no distension.     Palpations: Abdomen is soft. There is no mass.     Tenderness: There is abdominal tenderness in the left lower quadrant. There is guarding. There is no rebound.     Hernia: No hernia is present.  Musculoskeletal:        General: No tenderness.     Cervical back: Neck supple.  Skin:    General: Skin is warm and dry.     Findings: No rash.  Neurological:      Mental Status: He is alert.     Cranial Nerves: No cranial nerve  deficit (no facial droop, extraocular movements intact, no slurred speech).     Sensory: No sensory deficit.     Motor: No abnormal muscle tone or seizure activity.     Coordination: Coordination normal.     ED Results / Procedures / Treatments   Labs (all labs ordered are listed, but only abnormal results are displayed) Labs Reviewed  COMPREHENSIVE METABOLIC PANEL - Abnormal; Notable for the following components:      Result Value   Sodium 128 (*)    Chloride 93 (*)    Glucose, Bld 127 (*)    Total Bilirubin 1.4 (*)    All other components within normal limits  URINALYSIS, ROUTINE W REFLEX MICROSCOPIC - Abnormal; Notable for the following components:   pH 9.0 (*)    All other components within normal limits  LIPASE, BLOOD  CBC WITH DIFFERENTIAL/PLATELET    EKG None  Radiology CT ABDOMEN PELVIS W CONTRAST  Result Date: 01/02/2020 CLINICAL DATA:  Lower abdominal pain. The patient is on immunotherapy for lung carcinoma. EXAM: CT ABDOMEN AND PELVIS WITH CONTRAST TECHNIQUE: Multidetector CT imaging of the abdomen and pelvis was performed using the standard protocol following bolus administration of intravenous contrast. CONTRAST:  100 mL OMNIPAQUE IOHEXOL 300 MG/ML  SOLN COMPARISON:  CT chest, abdomen and pelvis 05/13/2019. FINDINGS: Lower chest: No pleural effusion. Small pericardial effusion is unchanged. Lung bases are emphysematous. There is mild dependent atelectasis. Hepatobiliary: A few small low attenuating lesions in the liver likely representing cysts are unchanged. Status post cholecystectomy. Biliary tree unremarkable. Pancreas: Unremarkable. No pancreatic ductal dilatation or surrounding inflammatory changes. Spleen: Normal in size without focal abnormality. 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 The left adrenal gland appears normal. There is some scarring in the right kidney  which is unchanged. The kidneys otherwise appear normal. No stones or hydronephrosis. The urinary bladder is distended but otherwise unremarkable. Stomach/Bowel: Diverticulosis is identified. There is thickening and stranding near the junction of the descending and sigmoid consistent with acute diverticulitis. No abscess or perforation. The stomach and small bowel appear normal. The appendix is normal in appearance. Note is made that the cecum is positioned in the right upper quadrant. Vascular/Lymphatic: Aortic atherosclerosis. No enlarged abdominal or pelvic lymph nodes. Reproductive: Prostate is unremarkable. Other: None. Musculoskeletal: No acute or focal abnormality. Severe degenerative change throughout the visualized spine is noted. IMPRESSION: Diverticulosis with acute diverticulitis the junction of the distal descending and sigmoid colon. No abscess or perforation. No change in a moderate pericardial effusion. Emphysema. Extensive atherosclerosis. Electronically Signed   By: Inge Rise M.D.   On: 01/02/2020 13:10    Procedures Procedures (including critical care time)  Medications Ordered in ED Medications  sodium chloride (PF) 0.9 % injection (has no administration in time range)  piperacillin-tazobactam (ZOSYN) IVPB 3.375 g (has no administration in time range)  iohexol (OMNIPAQUE) 300 MG/ML solution 100 mL (100 mLs Intravenous Contrast Given 01/02/20 1236)    ED Course  I have reviewed the triage vital signs and the nursing notes.  Pertinent labs & imaging results that were available during my care of the patient were reviewed by me and considered in my medical decision making (see chart for details).  Clinical Course as of Jan 01 1335  Thu Jan 02, 2020  1326 Labs reviewed.  No significant abnormalities other than hyponatremia.  CT scan demonstrates diverticulitis without complication   [JK]    Clinical Course User Index [JK] Dorie Rank, MD   MDM  Rules/Calculators/A&P                       Patient presents with acute lower abdominal pain.  Patient remains nontoxic and afebrile however CT scan does show evidence of acute diverticulitis.  Patient also is hyponatremic.  Patient is also at higher risk because of his immunocompromised state from his chemotherapy treatment.  Considering these factors I will consult the medical service for admission, IV antibiotics and monitoring for his acute diverticulitis Final Clinical Impression(s) / ED Diagnoses Final diagnoses:  Acute diverticulitis  Hyponatremia      Dorie Rank, MD 01/02/20 1336

## 2020-01-02 NOTE — Telephone Encounter (Signed)
I left a message regarding schedule will mail 

## 2020-01-02 NOTE — Progress Notes (Signed)
MD paged regarding patient's fever of 101.7, oral. Currently there are not standing orders for treatment, will await new orders.

## 2020-01-02 NOTE — Significant Event (Signed)
Patient's Covid screening test done at [facility] yesterday came out to be positive.  Patient is not hypoxic and does not have any respiratory symptoms, afebrile.  His lungs are clear.  Started on Decadron only.I updated his daughter.I will check CXR. I updated the daughter on phone

## 2020-01-03 ENCOUNTER — Encounter (HOSPITAL_COMMUNITY): Payer: Self-pay | Admitting: Internal Medicine

## 2020-01-03 DIAGNOSIS — I480 Paroxysmal atrial fibrillation: Secondary | ICD-10-CM

## 2020-01-03 DIAGNOSIS — I482 Chronic atrial fibrillation, unspecified: Secondary | ICD-10-CM

## 2020-01-03 DIAGNOSIS — I313 Pericardial effusion (noninflammatory): Secondary | ICD-10-CM

## 2020-01-03 LAB — CBC
HCT: 43.2 % (ref 39.0–52.0)
Hemoglobin: 14.7 g/dL (ref 13.0–17.0)
MCH: 31 pg (ref 26.0–34.0)
MCHC: 34 g/dL (ref 30.0–36.0)
MCV: 91.1 fL (ref 80.0–100.0)
Platelets: 206 10*3/uL (ref 150–400)
RBC: 4.74 MIL/uL (ref 4.22–5.81)
RDW: 14.1 % (ref 11.5–15.5)
WBC: 9.2 10*3/uL (ref 4.0–10.5)
nRBC: 0 % (ref 0.0–0.2)

## 2020-01-03 LAB — COMPREHENSIVE METABOLIC PANEL
ALT: 18 U/L (ref 0–44)
AST: 23 U/L (ref 15–41)
Albumin: 3.3 g/dL — ABNORMAL LOW (ref 3.5–5.0)
Alkaline Phosphatase: 71 U/L (ref 38–126)
Anion gap: 10 (ref 5–15)
BUN: 16 mg/dL (ref 8–23)
CO2: 23 mmol/L (ref 22–32)
Calcium: 8.7 mg/dL — ABNORMAL LOW (ref 8.9–10.3)
Chloride: 98 mmol/L (ref 98–111)
Creatinine, Ser: 0.82 mg/dL (ref 0.61–1.24)
GFR calc Af Amer: >60 mL/min (ref >60)
GFR calc non Af Amer: >60 mL/min (ref >60)
Glucose, Bld: 146 mg/dL — ABNORMAL HIGH (ref 70–99)
Potassium: 4.3 mmol/L (ref 3.5–5.1)
Sodium: 131 mmol/L — ABNORMAL LOW (ref 135–145)
Total Bilirubin: 0.9 mg/dL (ref 0.3–1.2)
Total Protein: 5.8 g/dL — ABNORMAL LOW (ref 6.5–8.1)

## 2020-01-03 LAB — FERRITIN: Ferritin: 149 ng/mL (ref 24–336)

## 2020-01-03 LAB — C-REACTIVE PROTEIN: CRP: 18.2 mg/dL — ABNORMAL HIGH (ref <1.0)

## 2020-01-03 LAB — D-DIMER, QUANTITATIVE: D-Dimer, Quant: 0.87 ug/mL-FEU — ABNORMAL HIGH (ref 0.00–0.50)

## 2020-01-03 MED ORDER — SODIUM CHLORIDE 0.9 % IV BOLUS
500.0000 mL | Freq: Once | INTRAVENOUS | Status: AC
  Administered 2020-01-03: 22:00:00 500 mL via INTRAVENOUS

## 2020-01-03 MED ORDER — TAMSULOSIN HCL 0.4 MG PO CAPS
0.4000 mg | ORAL_CAPSULE | Freq: Two times a day (BID) | ORAL | Status: DC
  Administered 2020-01-03 – 2020-01-10 (×13): 0.4 mg via ORAL
  Filled 2020-01-03 (×14): qty 1

## 2020-01-03 MED ORDER — FENTANYL CITRATE (PF) 100 MCG/2ML IJ SOLN
25.0000 ug | Freq: Four times a day (QID) | INTRAMUSCULAR | Status: DC | PRN
  Administered 2020-01-03 (×3): 25 ug via INTRAVENOUS
  Filled 2020-01-03 (×3): qty 2

## 2020-01-03 MED ORDER — APIXABAN 5 MG PO TABS
5.0000 mg | ORAL_TABLET | Freq: Two times a day (BID) | ORAL | Status: DC
  Administered 2020-01-03 – 2020-01-05 (×4): 5 mg via ORAL
  Filled 2020-01-03 (×4): qty 1

## 2020-01-03 MED ORDER — CHLORHEXIDINE GLUCONATE CLOTH 2 % EX PADS
6.0000 | MEDICATED_PAD | Freq: Every day | CUTANEOUS | Status: DC
  Administered 2020-01-04 – 2020-01-10 (×7): 6 via TOPICAL

## 2020-01-03 MED ORDER — HYDROCODONE-ACETAMINOPHEN 5-325 MG PO TABS
1.0000 | ORAL_TABLET | Freq: Four times a day (QID) | ORAL | Status: DC | PRN
  Administered 2020-01-03: 21:00:00 1 via ORAL
  Filled 2020-01-03: qty 1

## 2020-01-03 NOTE — Progress Notes (Signed)
Notified Dr Kennon Holter of patient's run of v-tach and 102.3 temperature as ordered. Will await orders if necessary.

## 2020-01-03 NOTE — Progress Notes (Addendum)
PROGRESS NOTE    Anthony Curry.  AST:[id number]  DOB: 1941-03-08  PCP: [name], MD Admit date:01/02/2020  79 y.o. male with past medical history significant of stage IV non-small cell lung cancer, SIADH, paroxysmal A. fib, hypothyroidism, depression, anxiety presents from home with c/o LLQ abdominal pain x 1 day associated with diarrhea. Patient follows with Dr. [name] for right non-small cell lung cancer and is currently on chemotherapy. He lives with his wife and daughter.  He is functional and ambulatory at baseline.  His wife recently tested positive for Covid and was admitted at [facility]. His COVID screen test from [facility] resulted positive yesterday.He denies any respiratory c/o. ED Course: Febrile, Tmax 102F, HR 97, RR 16, BP 140/109 ,labs showed WBC 8.5, Hgb 14.6, Sodium 128, K 4.2, BUN/cr 15/0.8, ca 9.0. CT abdomen done in the emergency department showed acute diverticulitis at the junction of distal descending and sigmoid colon.  No abscess or perforation.Emphysema/moderate pericardial effusion. Hospital course: Patient admitted to [facility] with pain medicines, IV fluids, antibiotics. HC complicated by urinary retention of 700 ml requiring straight cath  Subjective:  Patient resting comfortably.  Rates abdominal pain at 8/10 after morphine but feels somewhat improving since he received IV fentanyl few minutes before my visit.  Denies any dyspnea except on exertion.  Saturating well on room air at rest.  Seen by cardiology earlier today.  Afebrile this morning.   Objective: Vitals:   01/02/20 2027 01/02/20 2215 01/03/20 0001 01/03/20 0412  BP:  114/62 120/67 (!) 110/55  Pulse:  79 75 (!) 58  Resp:  18 18 18   Temp: (!) 101.7 F (38.7 C) 99.9 F (37.7 C) 97.6 F (36.4 C) (!) 97.3 F (36.3 C)  TempSrc: Oral Oral    SpO2:  95% 97% 94%  Weight:      Height:        Intake/Output Summary (Last 24 hours) at 01/03/2020 0824 Last data filed at 01/03/2020 0600 Gross per 24 hour    Intake 1294.24 ml  Output --  Net 1294.24 ml   Filed Weights   01/02/20 1017  Weight: 65.8 kg    Physical Examination:  General exam: Appears calm and comfortable  Respiratory system: Right basal crackles. Respiratory effort normal. Cardiovascular system: S1 & S2 heard, RRR. No JVD, murmurs, rubs, gallops or clicks. No pedal edema. Gastrointestinal system: Abdomen is nondistended, soft and nontender. Normal bowel sounds heard. Central nervous system: Alert and oriented. No new focal neurological deficits. Extremities: No contractures, edema or joint deformities.  Skin: No rashes, lesions or ulcers Psychiatry: Judgement and insight appear normal. Mood & affect somewhat irritable.   Data Reviewed: I have personally reviewed following labs and imaging studies  CBC: Recent Labs  Lab 01/02/20 1041 01/03/20 0330  WBC 8.5 9.2  NEUTROABS 7.0  --   HGB 14.6 14.7  HCT 42.4 43.2  MCV 89.1 91.1  PLT 222 989   Basic Metabolic Panel: Recent Labs  Lab 01/02/20 1041 01/03/20 0330  NA 128* 131*  K 4.2 4.3  CL 93* 98  CO2 26 23  GLUCOSE 127* 146*  BUN 15 16  CREATININE 0.88 0.82  CALCIUM 9.0 8.7*   GFR: Estimated Creatinine Clearance: 66.9 mL/min (by C-G formula based on SCr of 0.82 mg/dL). Liver Function Tests: Recent Labs  Lab 01/02/20 1041 01/03/20 0330  AST 26 23  ALT 21 18  ALKPHOS 88 71  BILITOT 1.4* 0.9  PROT 6.5 5.8*  ALBUMIN 3.7 3.3*  Recent Labs  Lab 01/02/20 1041  LIPASE 19   No results for input(s): AMMONIA in the last 168 hours. Coagulation Profile: No results for input(s): INR, PROTIME in the last 168 hours. Cardiac Enzymes: No results for input(s): CKTOTAL, CKMB, CKMBINDEX, TROPONINI in the last 168 hours. BNP (last 3 results) No results for input(s): PROBNP in the last 8760 hours. HbA1C: No results for input(s): HGBA1C in the last 72 hours. CBG: No results for input(s): GLUCAP in the last 168 hours. Lipid Profile: No results for  input(s): CHOL, HDL, LDLCALC, TRIG, CHOLHDL, LDLDIRECT in the last 72 hours. Thyroid Function Tests: No results for input(s): TSH, T4TOTAL, FREET4, T3FREE, THYROIDAB in the last 72 hours. Anemia Panel: Recent Labs    01/02/20 1826 01/03/20 0330  FERRITIN 132 149   Sepsis Labs: No results for input(s): PROCALCITON, LATICACIDVEN in the last 168 hours.  Recent Results (from the past 240 hour(s))  Novel Coronavirus, NAA ([facility])     Status: None   Collection Time: 12/27/19  1:41 PM   Specimen: Nasopharyngeal(NP) swabs in vial transport medium   NASOPHARYNGE  TESTING  Result Value Ref Range Status   SARS-CoV-2, NAA Not Detected Not Detected Final    Comment: This nucleic acid amplification test was developed and its performance characteristics determined by [employer]. Nucleic acid amplification tests include RT-PCR and TMA. This test has not been FDA cleared or approved. This test has been authorized by FDA under an Emergency Use Authorization (EUA). This test is only authorized for the duration of time the declaration that circumstances exist justifying the authorization of the emergency use of in vitro diagnostic tests for detection of SARS-CoV-2 virus and/or diagnosis of COVID-19 infection under section 564(b)(1) of the Act, 21 U.S.C. 948NIO-2(V) (1), unless the authorization is terminated or revoked sooner. When diagnostic testing is negative, the possibility of a false negative result should be considered in the context of a patient's recent exposures and the presence of clinical signs and symptoms consistent with COVID-19. An individual without symptoms of COVID-19 and who is not shedding SARS-CoV-2 virus wo uld expect to have a negative (not detected) result in this assay.   Novel Coronavirus, NAA ([facility])     Status: Abnormal   Collection Time: 01/01/20  2:24 PM   Specimen: Nasopharyngeal(NP) swabs in vial transport medium   NASOPHARYNGE  TESTING  Result  Value Ref Range Status   SARS-CoV-2, NAA Detected (A) Not Detected Final    Comment: This nucleic acid amplification test was developed and its performance characteristics determined by [employer]. Nucleic acid amplification tests include RT-PCR and TMA. This test has not been FDA cleared or approved. This test has been authorized by FDA under an Emergency Use Authorization (EUA). This test is only authorized for the duration of time the declaration that circumstances exist justifying the authorization of the emergency use of in vitro diagnostic tests for detection of SARS-CoV-2 virus and/or diagnosis of COVID-19 infection under section 564(b)(1) of the Act, 21 U.S.C. 035KKX-3(G) (1), unless the authorization is terminated or revoked sooner. When diagnostic testing is negative, the possibility of a false negative result should be considered in the context of a patient's recent exposures and the presence of clinical signs and symptoms consistent with COVID-19. An individual without symptoms of COVID-19 and who is not shedding SARS-CoV-2 virus wo uld expect to have a negative (not detected) result in this assay.   SARS CORONAVIRUS 2 (TAT 6-24 HRS) Nasopharyngeal Nasopharyngeal Swab     Status:  Abnormal   Collection Time: 01/02/20  2:29 PM   Specimen: Nasopharyngeal Swab  Result Value Ref Range Status   SARS Coronavirus 2 POSITIVE (A) NEGATIVE Final    Comment: RESULT CALLED TO, READ BACK BY AND VERIFIED WITH: T BAKER,RN 2015 01/02/2020 D BRADLEY (NOTE) SARS-CoV-2 target nucleic acids are DETECTED. The SARS-CoV-2 RNA is generally detectable in upper and lower respiratory specimens during the acute phase of infection. Positive results are indicative of the presence of SARS-CoV-2 RNA. Clinical correlation with patient history and other diagnostic information is  necessary to determine patient infection status. Positive results do not rule out bacterial infection or co-infection  with other viruses.  The expected result is Negative. Fact Sheet for Patients: SugarRoll.be Fact Sheet for Healthcare Providers: https://www.woods-mathews.com/ This test is not yet approved or cleared by the [place] FDA and  has been authorized for detection and/or diagnosis of SARS-CoV-2 by FDA under an Emergency Use Authorization (EUA). This EUA will remain  in effect (meaning this test can be used) for the  duration of the COVID-19 declaration under Section 564(b)(1) of the Act, 21 U.S.C. section 360bbb-3(b)(1), unless the authorization is terminated or revoked sooner. Performed at [facility], [place] [address], [place], [place] [address]       Radiology Studies: CT ABDOMEN PELVIS W CONTRAST  Result Date: 01/02/2020 CLINICAL DATA:  Lower abdominal pain. The patient is on immunotherapy for lung carcinoma. EXAM: CT ABDOMEN AND PELVIS WITH CONTRAST TECHNIQUE: Multidetector CT imaging of the abdomen and pelvis was performed using the standard protocol following bolus administration of intravenous contrast. CONTRAST:  100 mL OMNIPAQUE IOHEXOL 300 MG/ML  SOLN COMPARISON:  CT chest, abdomen and pelvis 05/13/2019. FINDINGS: Lower chest: No pleural effusion. Small pericardial effusion is unchanged. Lung bases are emphysematous. There is mild dependent atelectasis. Hepatobiliary: A few small low attenuating lesions in the liver likely representing cysts are unchanged. Status post cholecystectomy. Biliary tree unremarkable. Pancreas: Unremarkable. No pancreatic ductal dilatation or surrounding inflammatory changes. Spleen: Normal in size without focal abnormality. 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 The left adrenal gland appears normal. There is some scarring in the right kidney which is unchanged. The kidneys otherwise appear normal. No stones or hydronephrosis. The urinary bladder is distended but otherwise  unremarkable. Stomach/Bowel: Diverticulosis is identified. There is thickening and stranding near the junction of the descending and sigmoid consistent with acute diverticulitis. No abscess or perforation. The stomach and small bowel appear normal. The appendix is normal in appearance. Note is made that the cecum is positioned in the right upper quadrant. Vascular/Lymphatic: Aortic atherosclerosis. No enlarged abdominal or pelvic lymph nodes. Reproductive: Prostate is unremarkable. Other: None. Musculoskeletal: No acute or focal abnormality. Severe degenerative change throughout the visualized spine is noted. IMPRESSION: Diverticulosis with acute diverticulitis the junction of the distal descending and sigmoid colon. No abscess or perforation. No change in a moderate pericardial effusion. Emphysema. Extensive atherosclerosis. Electronically Signed   By: Inge Rise M.D.   On: 01/02/2020 13:10   DG CHEST PORT 1 VIEW  Result Date: 01/02/2020 CLINICAL DATA:  79 year old male with stage IV lung cancer, SI ADH. Undergoing chemotherapy. Positive COVID-19 yesterday. EXAM: PORTABLE CHEST 1 VIEW COMPARISON:  CT Abdomen and Pelvis earlier today. Chest CT 09/02/2019 and earlier. FINDINGS: Portable AP semi upright view at 1741 hours. Stable lung volumes and mediastinal contours since 2020. Numerous chronic right rib fractures. Chronic reticular opacity in the right upper lobe and near the left hilum. Elsewhere when  allowing for portable technique the lungs are clear. No pneumothorax, pulmonary edema or pleural effusion. Stable visualized osseous structures. IMPRESSION: Stable radiographic appearance of chronic lung disease since July 2020. No acute cardiopulmonary abnormality. Electronically Signed   By: Genevie Ann M.D.   On: 01/02/2020 18:38   ECHOCARDIOGRAM COMPLETE  Result Date: 01/02/2020   ECHOCARDIOGRAM REPORT   Patient Name:   Rodricus Candelaria. Date of Exam: 01/02/2020 Medical Rec #:  [id number]            Height:       71.0 in Accession #:    [id number]          Weight:       145.0 lb Date of Birth:  [birth year]          BSA:          1.84 m Patient Age:    1 years            BP:           152/65 mmHg Patient Gender: M                   HR:           82 bpm. Exam Location:  Inpatient Procedure: 2D Echo, Cardiac Doppler and Color Doppler Indications:    Pericardial effusion 423.9  History:        Patient has prior history of Echocardiogram examinations, most                 recent 07/26/2014. Arrythmias:Atrial Fibrillation and Atrial                 Flutter; Risk Factors:Hypertension and Former Smoker. GERD.  Sonographer:    Paulita Fujita RDCS Referring Phys: [id number] AMRIT ADHIKARI  Sonographer Comments: Image acquisition challenging due to patient body habitus. IMPRESSIONS  1. Left ventricular ejection fraction, by visual estimation, is 60 to 65%. The left ventricle has normal function. There is no left ventricular hypertrophy.  2. Left ventricular diastolic parameters are consistent with Grade I diastolic dysfunction (impaired relaxation).  3. Global right ventricle has normal systolic function.The right ventricular size is normal. No increase in right ventricular wall thickness.  4. Left atrial size was normal.  5. Right atrial size was normal.  6. Large pericardial effusion.  7. The pericardial effusion is anterior to the right ventricle.  8. There is inversion of the right ventricular wall and excessive respiratory variation in the tricuspid valve spectral Doppler velocities.  9. The mitral valve is normal in structure. Mild mitral valve regurgitation. No evidence of mitral stenosis. 10. The tricuspid valve is normal in structure. 11. The tricuspid valve is normal in structure. Tricuspid valve regurgitation is mild. 12. The aortic valve is normal in structure. Aortic valve regurgitation is not visualized. No evidence of aortic valve sclerosis or stenosis. 13. The pulmonic valve was normal in structure. Pulmonic  valve regurgitation is not visualized. 14. Mildly elevated pulmonary artery systolic pressure. 15. The inferior vena cava is normal in size with greater than 50% respiratory variability, suggesting right atrial pressure of 3 mmHg. FINDINGS  Left Ventricle: Left ventricular ejection fraction, by visual estimation, is 60 to 65%. The left ventricle has normal function. The left ventricle is not well visualized. There is no left ventricular hypertrophy. Left ventricular diastolic parameters are consistent with Grade I diastolic dysfunction (impaired relaxation). Indeterminate filling pressures. Right Ventricle: The right ventricular size is normal. No increase in right ventricular  wall thickness. Global RV systolic function is has normal systolic function. The tricuspid regurgitant velocity is 2.46 m/s, and with an assumed right atrial pressure  of 8 mmHg, the estimated right ventricular systolic pressure is mildly elevated at 32.2 mmHg. Left Atrium: Left atrial size was normal in size. Right Atrium: Right atrial size was normal in size Pericardium: A large pericardial effusion is present. The pericardial effusion is anterior to the right ventricle. There is inversion of the right ventricular wall and excessive respiratory variation in the tricuspid valve spectral Doppler velocities. Pericardial effusion measuring up to 1.86 cm. Unable to visualize the IVC. Findings may be con. Mitral Valve: The mitral valve is normal in structure. Mild mitral valve regurgitation. No evidence of mitral valve stenosis by observation. Tricuspid Valve: The tricuspid valve is normal in structure. Tricuspid valve regurgitation is mild. Aortic Valve: The aortic valve is normal in structure. Aortic valve regurgitation is not visualized. The aortic valve is structurally normal, with no evidence of sclerosis or stenosis. Pulmonic Valve: The pulmonic valve was normal in structure. Pulmonic valve regurgitation is not visualized. Pulmonic  regurgitation is not visualized. Aorta: The aortic root, ascending aorta and aortic arch are all structurally normal, with no evidence of dilitation or obstruction. Venous: The inferior vena cava was not well visualized. The inferior vena cava is normal in size with greater than 50% respiratory variability, suggesting right atrial pressure of 3 mmHg. IAS/Shunts: No atrial level shunt detected by color flow Doppler. There is no evidence of a patent foramen ovale. No ventricular septal defect is seen or detected. There is no evidence of an atrial septal defect.   Diastology LV e' lateral:   5.87 cm/s LV E/e' lateral: 11.5 LV e' medial:    4.90 cm/s LV E/e' medial:  13.7  RIGHT VENTRICLE RV S prime:     28.60 cm/s TAPSE (M-mode): 2.1 cm LEFT ATRIUM             Index       RIGHT ATRIUM           Index LA Vol (A2C):   30.0 ml 16.31 ml/m RA Area:     12.10 cm LA Vol (A4C):   21.2 ml 11.53 ml/m RA Volume:   30.10 ml  16.37 ml/m LA Biplane Vol: 25.3 ml 13.76 ml/m  AORTIC VALVE LVOT Vmax:   70.30 cm/s LVOT Vmean:  50.200 cm/s LVOT VTI:    0.137 m MITRAL VALVE                        TRICUSPID VALVE MV Area (PHT): 2.96 cm             TR Peak grad:   24.2 mmHg MV PHT:        74.24 msec           TR Vmax:        246.00 cm/s MV Decel Time: 256 msec MV E velocity: 67.30 cm/s 103 cm/s  SHUNTS MV A velocity: 65.10 cm/s 70.3 cm/s Systemic VTI: 0.14 m MV E/A ratio:  1.03       1.5  Skeet Latch MD Electronically signed by Skeet Latch MD Signature Date/Time: 01/02/2020/6:33:38 PM    Final         Scheduled Meds: . apixaban  5 mg Oral BID  . vitamin C  500 mg Oral BID  . chlorhexidine  15 mL Mouth Rinse BID  . demeclocycline  150 mg Oral  BID  . dexamethasone (DECADRON) injection  6 mg Intravenous Q24H  . diltiazem  180 mg Oral Q1200  . fluticasone  2 spray Each Nare QHS  . levothyroxine  50 mcg Oral QAC breakfast  . mouth rinse  15 mL Mouth Rinse q12n4p  . mirtazapine  30 mg Oral QHS  . tamsulosin  0.4 mg  Oral Q1200  . thiamine  100 mg Oral Daily  . zinc sulfate  220 mg Oral Daily   Continuous Infusions: . sodium chloride 100 mL/hr at 01/02/20 1606  . piperacillin-tazobactam (ZOSYN)  IV 3.375 g (01/03/20 0412)    Assessment & Plan:   Acute diverticulitis: CT abdomen showed acute diverticulitis at the junction of distal descending and sigmoid colon.  No abscess or perforation.  Admitted with NPO status, IV fluids, antibiotics. Pain management changed from morphine to fentanyl as he said its wasn't helping . Will try clear liquid today, advance diet as tolerated.Will need interval colonoscopy in 6 weeks--may have colon mets.   Hyponatremia: It appears that patient has had historically fluctuating sodium levels 128-135. Most likely associated  with SIADH related to lung cancer.  He takes demeclocycline which will be continued.  Check BMP tomorrow.  Right sided-stage IV non-small cell lung cancer: Patient apparently was scheduled for chemo infusion yesterday but canceled due to Covid suspicion. Informed oncology APP regarding his current admission and diagnosis.  Emphysemia with now Covid positive status: Continue MDI as needed.  Given no evidence of hypoxia and ongoing infection with diverticulitis in this immunosuppressed patient, will discontinue IV Decadron.  Pericardial effusion: CT scan showed mild to moderate pericardial effusion.  Most likely this is associated with his malignancy.  Echo read as grade 1 diastolic dysfunction with preserved EF-large pericardial effusion anterior to the right ventricle with inversion of the right ventricular wall and excessive respiratory variation in the tricuspid valve spectral Doppler velocities. Patient is asymptomatic but given significant Echo findings consulted cardiology--Per independent review by Dr. Farris Has, echo findings likely only represent moderate effusion related to malignancy.  Okay to resume anticoagulation in no acute interventions  recommended.  Paroxysmal A. fib: Currently in normal sinus rhythm. Resume Eliquis for anticoagulation.  Takes Cardizem for rate control  Hypertension:  Home medications resumed.  Watch for hypotension/elevated JVD or other clinical signs of worsening pericardial effusion  Anxiety/depression: Resume home Xanax and mirtazapine.  BPH/ Urinary retention: In the setting of IV opiates.  Continue tamsulosin, straight cath this morning for 754ml noted on bladder scan-if has recurrent retention, will place indwelling Foley.   Hypothyroidism: Continue Synthyroid  Protein calorie malnutrition-mild: Seen by dietitian  DVT prophylaxis: On Eliquis Code Status: Full code Family / Patient Communication: Discussed with patient and answered all questions to satisfaction.  I also called daughter, [name], and updated regarding hospital course on her request. Disposition Plan: Home when medically cleared.  Patient's wife apparently just discharged from the hospital after being treated for Covid     LOS: 1 day    Time spent: 35 minutes    Guilford Shi, MD [facility] Pager [phone]  If 7PM-7AM, please contact night-coverage [email or web address] Password [facility] 01/03/2020, 8:24 AM

## 2020-01-03 NOTE — Consult Note (Addendum)
Cardiology Consultation:   Patient ID: [facility]. MRN: [id number]; DOB: November 09, 1941  Admit date: 01/02/2020 Date of Consult: 01/03/2020  Primary Care Provider: [name], MD Primary Cardiologist: No primary care provider on file. New to Dr. Audie [facility]. Followed by the [facility]  Patient Profile:   [facility]. is a 79 y.o. male with a hx of stage IV lung CA (chemo, radiation, immunotherapy), treated adrenal mass, paroxysmal atrial fibrillation, alcoholism in remission, anemia of chronic disease, BPH, GERD, HTN, pulmonary nodules, arthritis, chronic dizziness/unsteadiness felt related to neuropathy, pericardial effusion by prior imaging who is being seen today for the evaluation of pericardial effusion at the request of Dr. Tawanna [facility].  History of Present Illness:   He is followed by the atrial fib clinic for PAF, which was diagnosed in 2015. He required prior cardioversion and has done well from that standpoint. 2D echo 2015 showed normal LVEF and small pericardial effusion. CT in 08/2019 did demonstrate a small-moderate pericardial effusion. He has had issues with chronic dizziness 24/7. He follows with oncology for his metastatic lung cancer. He presented to [facility] with primarily LLQ abdominal pain for the past 2 days. He also had some diarrhea. He was found to have Covid. His wife recently tested positive as well and had required hospitalization at [facility]. She has since got to go home. He reports chronic DOE on occasion, but denies any recent change with this. No chest pain, palpitations, syncope, swelling. He denies any fevers or chills at home but has been febrile here. CT abdomen done in the emergency department showed acute diverticulitis at the junction of distal descending and sigmoid colon, no abscess or perforation. Labs are otherwise revealing for hyopnatremia and elevated d-dimer. 2D echo done yesterday showed EF 60-65%, grade 1 DD, normal RV size and large pericardial  effusion. He is not tachycardic or hypotensive. He reports feeling OK this morning, no specific cardiac symptoms. Initial HPI obtained via telephone in order to limit patient contact given Covid pandemic. Attending MD did physically speak to and examine the patient.  Past Medical History:  Diagnosis Date  . 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 Adrenal mass ([facility])   . Alcoholism in remission ([facility])   . Anemia of chronic disease   . 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 Anxiety   . BPH (benign prostatic hyperplasia)   . Cancer ([facility])    small cell/lung  . Complication of anesthesia 09/08/2015    JITTERY AFTER GALLBLADDER SURGERY  . GERD (gastroesophageal reflux disease)   . History of radiation therapy 11/14/16, 11/18/16, 11/22/16   Left upper lung: 54 Gy in 3 fractions  . Hypertension   . Neoplastic malignant related fatigue    IV tx. every 2 weeks last9-14-16 (Dr. Earlie [facility]), radiation last tx. 6 months  . 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 Pericardial effusion   . Persistent atrial fibrillation ([facility])   . Pulmonary nodules   . Radiation 12/02/14-12/16/14   Left adrenal gland 30 Gy in 10 fractions    Past Surgical History:  Procedure Laterality Date  . ANKLE ARTHRODESIS  1995   rt fx-hardware in  . CARDIOVERSION N/A 09/29/2014   Procedure: CARDIOVERSION;  Surgeon: Anthony Hector, MD;  Location: [facility];  Service: Cardiovascular;  Laterality: N/A;  . CHOLECYSTECTOMY  09/08/2015    laproscopic   . CHOLECYSTECTOMY N/A 09/08/2015   Procedure: LAPAROSCOPIC CHOLECYSTECTOMY WITH ATTEMPTED INTRAOPERATIVE CHOLANGIOGRAM ;  Surgeon: Anthony Skates, MD;  Location: [facility];  Service:  General;  Laterality: N/A;  . COLONOSCOPY    . ERCP N/A 09/02/2015   Procedure: ENDOSCOPIC RETROGRADE CHOLANGIOPANCREATOGRAPHY (ERCP);  Surgeon: Anthony Silence, MD;  Location: [facility];  Service: Endoscopy;  Laterality: N/A;  . ERCP N/A 09/07/2015   Procedure: ENDOSCOPIC RETROGRADE  CHOLANGIOPANCREATOGRAPHY (ERCP);  Surgeon: Anthony Essex, MD;  Location: [facility];  Service: Endoscopy;  Laterality: N/A;  . EXCISION / CURETTAGE BONE CYST FINGER  2005   rt thumb  . HEMORRHOID SURGERY    . Percutaneous Cholecytostomy tube     IVR insertion 06-13-15(Dr. Vernard [facility])- 08-28-15 remains in place to drainage bag  . TONSILLECTOMY    . TOTAL SHOULDER ARTHROPLASTY  02/03/2012   Procedure: TOTAL SHOULDER ARTHROPLASTY;  Surgeon: Anthony Bridge, MD;  Location: [facility];  Service: Orthopedics;  Laterality: Left;     Home Medications:  Prior to Admission medications   Medication Sig Start Date End Date Taking? Authorizing Provider  ALPRAZolam Duanne Moron) 0.5 MG tablet Take 0.5 mg by mouth at bedtime as needed. 09/24/19  Yes [provider]  apixaban (ELIQUIS) 5 MG TABS tablet Take 5 mg by mouth 2 (two) times daily.   Yes [provider]  Ascorbic Acid (VITAMIN C PO) Take 1,000 mg by mouth daily.    Yes [provider]  B Complex-C (SUPER B COMPLEX PO) Take 1 each by mouth daily.    Yes [provider]  calcium carbonate (OS-CAL) 600 MG TABS Take 600 mg by mouth daily with breakfast.   Yes [provider]  demeclocycline (DECLOMYCIN) 150 MG tablet Take 2 tablets twice a day Patient taking differently: Take 150 mg by mouth 2 (two) times daily.  07/09/19  Yes Heilingoetter, Cassandra L, PA-C  diltiazem (CARDIZEM CD) 180 MG 24 hr capsule Take 1 capsule (180 mg total) by mouth daily at 12 noon. 01/09/15  Yes Sherran Needs, NP  doxycycline (VIBRAMYCIN) 100 MG capsule Take 100 mg by mouth 2 (two) times daily. 10/26/19  Yes [provider]  fluticasone (FLONASE) 50 MCG/ACT nasal spray Place 2 sprays into both nostrils at bedtime.  10/17/19  Yes [provider]  HYDROcodone-acetaminophen (NORCO/VICODIN) 5-325 MG tablet Take 1 tablet by mouth 3 (three) times daily as needed for moderate pain. 11/28/19  Yes [name], MD    KLOR-CON M20 20 MEQ tablet Take 20 mEq by mouth every morning.  10/18/14  Yes [provider]  Lactobacillus (ACIDOPHILUS) CAPS capsule Take 1 capsule by mouth daily.   Yes [provider]  levothyroxine (SYNTHROID, LEVOTHROID) 50 MCG tablet TAKE 1 TABLET (50 MCG TOTAL) BY MOUTH DAILY BEFORE BREAKFAST. 03/24/18  Yes [name], MD  MAGNESIUM PO Take 400 mg by mouth daily.    Yes [provider]  mirtazapine (REMERON) 30 MG tablet TAKE 1 TABLET(30 MG) BY MOUTH AT BEDTIME Patient taking differently: Take 30 mg by mouth at bedtime.  11/28/19  Yes [name], MD  Multiple Vitamin (MULTIVITAMIN WITH MINERALS) TABS Take 1 tablet by mouth daily.   Yes [provider]  Nivolumab (OPDIVO IV) Inject as directed every 30 (thirty) days. Inject as directed every 2 weeks   Yes [provider]  Nutritional Supplements (ENSURE COMPLETE PO) Take 1 Can by mouth 2 (two) times daily.   Yes [provider]  Omega-3 Fatty Acids (FISH OIL PO) Take 1 tablet by mouth daily.   Yes [provider]  tamsulosin (FLOMAX) 0.4 MG CAPS capsule Take 0.4 mg by mouth daily at  12 noon.    Yes [provider]  thiamine (VITAMIN B-1) 100 MG tablet Take 1 tablet (100 mg total) by mouth daily. 09/11/13  Yes Erick Colace, NP  vitamin E 400 UNIT capsule Take 400 Units by mouth daily.   Yes [provider]    Inpatient Medications: Scheduled Meds: . vitamin C  500 mg Oral BID  . chlorhexidine  15 mL Mouth Rinse BID  . demeclocycline  150 mg Oral BID  . dexamethasone (DECADRON) injection  6 mg Intravenous Q24H  . diltiazem  180 mg Oral Q1200  . fluticasone  2 spray Each Nare QHS  . levothyroxine  50 mcg Oral QAC breakfast  . mouth rinse  15 mL Mouth Rinse q12n4p  . mirtazapine  30 mg Oral QHS  . tamsulosin  0.4 mg Oral Q1200  . thiamine  100 mg Oral Daily  . zinc sulfate  220 mg Oral Daily   Continuous Infusions: . sodium chloride 100  mL/hr at 01/02/20 1606  . piperacillin-tazobactam (ZOSYN)  IV 3.375 g (01/03/20 0412)   PRN Meds: acetaminophen, ALPRAZolam, hydrALAZINE, morphine injection, ondansetron (ZOFRAN) IV  Allergies:   No Known Allergies  Social History:   Social History   Socioeconomic History  . Marital status: Married    Spouse name: Berwyn Bigley  . Number of children: 3  . Years of education: 107  . Highest education level: Not on file  Occupational History  . 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 Smoking status: Former Smoker    Packs/day: 2.00    Years: 30.00    Pack years: 60.00    Types: Cigarettes    Quit date: 12/05/1990    Years since quitting: 29.0  . Smokeless tobacco: Never Used  Substance and Sexual Activity  . Alcohol use: Yes    Alcohol/week: 0.0 standard drinks    Comment: past hx.ETOH abuse,08-31-15 "occ. wine ,drinks non alcoholic beer occ."  . Drug use: No  . Sexual activity: Not Currently  Other Topics Concern  . Not on file  Social History Narrative   Lives at home with wife.   Caffeine use: Drinks coffee- 2 cups per day   Retired from [employer] Strain:   . Difficulty of Paying Living Expenses: Not on file  Food Insecurity:   . Worried About Charity fundraiser in the Last Year: Not on file  . Ran Out of Food in the Last Year: Not on file  Transportation Needs:   . Lack of Transportation (Medical): Not on file  . Lack of Transportation (Non-Medical): Not on file  Physical Activity:   . Days of Exercise per Week: Not on file  . Minutes of Exercise per Session: Not on file  Stress:   . Feeling of Stress : Not on file  Social Connections:   . Frequency of Communication with Friends and Family: Not on file  . Frequency of Social Gatherings with Friends and Family: Not on file  . Attends Religious Services: Not on file  . Active Member of Clubs or Organizations:  Not on file  . Attends Archivist Meetings: Not on file  . Marital Status: Not on file  Intimate Partner Violence:   . Fear of Current or Ex-Partner: Not on file  . Emotionally Abused: Not on file  . Physically Abused: Not on file  . Sexually  Abused: Not on file    Family History:    Family History  Problem Relation Age of Onset  . Lung cancer Mother   . Hypertension Father   . Heart attack Father   . Diabetes Paternal Grandmother   . Brain cancer Brother   . Heart attack Brother   . Neuropathy Neg Hx      ROS:  Please see the history of present illness.   All other ROS reviewed and negative.     Physical Exam/Data:   Vitals:   01/02/20 2215 01/03/20 0001 01/03/20 0412 01/03/20 0836  BP: 114/62 120/67 (!) 110/55 140/62  Pulse: 79 75 (!) 58 71  Resp: 18 18 18 18   Temp: 99.9 F (37.7 C) 97.6 F (36.4 C) (!) 97.3 F (36.3 C) (!) 97.5 F (36.4 C)  TempSrc: Oral   Oral  SpO2: 95% 97% 94% 93%  Weight:      Height:         Intake/Output Summary (Last 24 hours) at 01/03/2020 1124 Last data filed at 01/03/2020 0856 Gross per 24 hour  Intake 1294.24 ml  Output 700 ml  Net 594.24 ml    Last 3 Weights 01/02/2020 12/02/2019 10/29/2019  Weight (lbs) 145 lb 146 lb 14.4 oz 143 lb 6.4 oz  Weight (kg) 65.772 kg 66.633 kg 65.046 kg    Body mass index is 20.22 kg/m.  General: Well nourished, well developed, in no acute distress Head: Atraumatic, normal size  Eyes: PEERLA, EOMI  Neck: Supple, no JVD Endocrine: No thryomegaly Cardiac: Normal S1, S2; RRR; no murmurs, rubs, or gallops Lungs: Clear to auscultation bilaterally, no wheezing, rhonchi or rales  Abd: Soft, nontender, no hepatomegaly  Ext: No edema, pulses 2+ Musculoskeletal: No deformities, BUE and BLE strength normal and equal Skin: Warm and dry, no rashes   Neuro: Alert and oriented to person, place, time, and situation, CNII-XII grossly intact, no focal deficits  Psych: Normal mood and affect   EKG:   The EKG was personally reviewed and demonstrates: NSR 66bpm, first degree AVB, lower voltage QRS, TWI aVL, V2  Telemetry:  Telemetry was personally reviewed and demonstrates: NSR, first degree AVB  Relevant CV Studies: 2D echo 01/02/20 1. Left ventricular ejection fraction, by visual estimation, is 60 to 65%. The left ventricle has normal function. There is no left ventricular hypertrophy. 2. Left ventricular diastolic parameters are consistent with Grade I diastolic dysfunction (impaired relaxation). 3. Global right ventricle has normal systolic function.The right ventricular size is normal. No increase in right ventricular wall thickness. 4. Left atrial size was normal. 5. Right atrial size was normal. 6. Large pericardial effusion. 7. The pericardial effusion is anterior to the right ventricle. 8. There is inversion of the right ventricular wall and excessive respiratory variation in the tricuspid valve spectral Doppler velocities. 9. The mitral valve is normal in structure. Mild mitral valve regurgitation. No evidence of mitral stenosis. 10. The tricuspid valve is normal in structure. 11. The tricuspid valve is normal in structure. Tricuspid valve regurgitation is mild. 12. The aortic valve is normal in structure. Aortic valve regurgitation is not visualized. No evidence of aortic valve sclerosis or stenosis. 13. The pulmonic valve was normal in structure. Pulmonic valve regurgitation is not visualized. 14. Mildly elevated pulmonary artery systolic pressure. 15. The inferior vena cava is normal in size with greater than 50% respiratory variability, suggesting right atrial pressure of 3 mmHg.  Laboratory Data:  Chemistry Recent Labs  Lab 01/02/20 1041 01/03/20 0330  NA 128* 131*  K 4.2 4.3  CL 93* 98  CO2 26 23  GLUCOSE 127* 146*  BUN 15 16  CREATININE 0.88 0.82  CALCIUM 9.0 8.7*  GFRNONAA >60 >60  GFRAA >60 >60  ANIONGAP 9 10    Recent Labs  Lab 01/02/20 1041  01/03/20 0330  PROT 6.5 5.8*  ALBUMIN 3.7 3.3*  AST 26 23  ALT 21 18  ALKPHOS 88 71  BILITOT 1.4* 0.9   Hematology Recent Labs  Lab 01/02/20 1041 01/03/20 0330  WBC 8.5 9.2  RBC 4.76 4.74  HGB 14.6 14.7  HCT 42.4 43.2  MCV 89.1 91.1  MCH 30.7 31.0  MCHC 34.4 34.0  RDW 13.9 14.1  PLT 222 206   Cardiac EnzymesNo results for input(s): TROPONINI in the last 168 hours. No results for input(s): TROPIPOC in the last 168 hours.  BNPNo results for input(s): BNP, PROBNP in the last 168 hours.  DDimer  Recent Labs  Lab 01/02/20 1826 01/03/20 0330  DDIMER 0.99* 0.87*    Radiology/Studies:  CT ABDOMEN PELVIS W CONTRAST  Result Date: 01/02/2020 CLINICAL DATA:  Lower abdominal pain. The patient is on immunotherapy for lung carcinoma. EXAM: CT ABDOMEN AND PELVIS WITH CONTRAST TECHNIQUE: Multidetector CT imaging of the abdomen and pelvis was performed using the standard protocol following bolus administration of intravenous contrast. CONTRAST:  100 mL OMNIPAQUE IOHEXOL 300 MG/ML  SOLN COMPARISON:  CT chest, abdomen and pelvis 05/13/2019. FINDINGS: Lower chest: No pleural effusion. Small pericardial effusion is unchanged. Lung bases are emphysematous. There is mild dependent atelectasis. Hepatobiliary: A few small low attenuating lesions in the liver likely representing cysts are unchanged. Status post cholecystectomy. Biliary tree unremarkable. Pancreas: Unremarkable. No pancreatic ductal dilatation or surrounding inflammatory changes. Spleen: Normal in size without focal abnormality. 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 The left adrenal gland appears normal. There is some scarring in the right kidney which is unchanged. The kidneys otherwise appear normal. No stones or hydronephrosis. The urinary bladder is distended but otherwise unremarkable. Stomach/Bowel: Diverticulosis is identified. There is thickening and stranding near the junction of the descending and  sigmoid consistent with acute diverticulitis. No abscess or perforation. The stomach and small bowel appear normal. The appendix is normal in appearance. Note is made that the cecum is positioned in the right upper quadrant. Vascular/Lymphatic: Aortic atherosclerosis. No enlarged abdominal or pelvic lymph nodes. Reproductive: Prostate is unremarkable. Other: None. Musculoskeletal: No acute or focal abnormality. Severe degenerative change throughout the visualized spine is noted. IMPRESSION: Diverticulosis with acute diverticulitis the junction of the distal descending and sigmoid colon. No abscess or perforation. No change in a moderate pericardial effusion. Emphysema. Extensive atherosclerosis. Electronically Signed   By: Inge Rise M.D.   On: 01/02/2020 13:10   DG CHEST PORT 1 VIEW  Result Date: 01/02/2020 CLINICAL DATA:  79 year old male with stage IV lung cancer, SI ADH. Undergoing chemotherapy. Positive COVID-19 yesterday. EXAM: PORTABLE CHEST 1 VIEW COMPARISON:  CT Abdomen and Pelvis earlier today. Chest CT 09/02/2019 and earlier. FINDINGS: Portable AP semi upright view at 1741 hours. Stable lung volumes and mediastinal contours since 2020. Numerous chronic right rib fractures. Chronic reticular opacity in the right upper lobe and near the left hilum. Elsewhere when allowing for portable technique the lungs are clear. No pneumothorax, pulmonary edema or pleural effusion. Stable visualized osseous structures. IMPRESSION: Stable radiographic appearance of chronic lung disease since July 2020. No acute cardiopulmonary abnormality. Electronically Signed   By: Genevie Ann  M.D.   On: 01/02/2020 18:38   ECHOCARDIOGRAM COMPLETE  Result Date: 01/02/2020   ECHOCARDIOGRAM REPORT   Patient Name:   [facility]. Date of Exam: 01/02/2020 Medical Rec #:  [id number]           Height:       71.0 in Accession #:    [id number]          Weight:       145.0 lb Date of Birth:  03-Apr-1941          BSA:          1.84 m  Patient Age:    26 years            BP:           152/65 mmHg Patient Gender: M                   HR:           82 bpm. Exam Location:  Inpatient Procedure: 2D Echo, Cardiac Doppler and Color Doppler Indications:    Pericardial effusion 423.9  History:        Patient has prior history of Echocardiogram examinations, most                 recent 07/26/2014. Arrythmias:Atrial Fibrillation and Atrial                 Flutter; Risk Factors:Hypertension and Former Smoker. GERD.  Sonographer:    Paulita Fujita RDCS Referring Phys: [id number] AMRIT ADHIKARI  Sonographer Comments: Image acquisition challenging due to patient body habitus. IMPRESSIONS  1. Left ventricular ejection fraction, by visual estimation, is 60 to 65%. The left ventricle has normal function. There is no left ventricular hypertrophy.  2. Left ventricular diastolic parameters are consistent with Grade I diastolic dysfunction (impaired relaxation).  3. Global right ventricle has normal systolic function.The right ventricular size is normal. No increase in right ventricular wall thickness.  4. Left atrial size was normal.  5. Right atrial size was normal.  6. Large pericardial effusion.  7. The pericardial effusion is anterior to the right ventricle.  8. There is inversion of the right ventricular wall and excessive respiratory variation in the tricuspid valve spectral Doppler velocities.  9. The mitral valve is normal in structure. Mild mitral valve regurgitation. No evidence of mitral stenosis. 10. The tricuspid valve is normal in structure. 11. The tricuspid valve is normal in structure. Tricuspid valve regurgitation is mild. 12. The aortic valve is normal in structure. Aortic valve regurgitation is not visualized. No evidence of aortic valve sclerosis or stenosis. 13. The pulmonic valve was normal in structure. Pulmonic valve regurgitation is not visualized. 14. Mildly elevated pulmonary artery systolic pressure. 15. The inferior vena cava is normal in  size with greater than 50% respiratory variability, suggesting right atrial pressure of 3 mmHg. FINDINGS  Left Ventricle: Left ventricular ejection fraction, by visual estimation, is 60 to 65%. The left ventricle has normal function. The left ventricle is not well visualized. There is no left ventricular hypertrophy. Left ventricular diastolic parameters are consistent with Grade I diastolic dysfunction (impaired relaxation). Indeterminate filling pressures. Right Ventricle: The right ventricular size is normal. No increase in right ventricular wall thickness. Global RV systolic function is has normal systolic function. The tricuspid regurgitant velocity is 2.46 m/s, and with an assumed right atrial pressure  of 8 mmHg, the estimated right ventricular systolic pressure is mildly elevated at 32.2 mmHg. Left  Atrium: Left atrial size was normal in size. Right Atrium: Right atrial size was normal in size Pericardium: A large pericardial effusion is present. The pericardial effusion is anterior to the right ventricle. There is inversion of the right ventricular wall and excessive respiratory variation in the tricuspid valve spectral Doppler velocities. Pericardial effusion measuring up to 1.86 cm. Unable to visualize the IVC. Findings may be con. Mitral Valve: The mitral valve is normal in structure. Mild mitral valve regurgitation. No evidence of mitral valve stenosis by observation. Tricuspid Valve: The tricuspid valve is normal in structure. Tricuspid valve regurgitation is mild. Aortic Valve: The aortic valve is normal in structure. Aortic valve regurgitation is not visualized. The aortic valve is structurally normal, with no evidence of sclerosis or stenosis. Pulmonic Valve: The pulmonic valve was normal in structure. Pulmonic valve regurgitation is not visualized. Pulmonic regurgitation is not visualized. Aorta: The aortic root, ascending aorta and aortic arch are all structurally normal, with no evidence of  dilitation or obstruction. Venous: The inferior vena cava was not well visualized. The inferior vena cava is normal in size with greater than 50% respiratory variability, suggesting right atrial pressure of 3 mmHg. IAS/Shunts: No atrial level shunt detected by color flow Doppler. There is no evidence of a patent foramen ovale. No ventricular septal defect is seen or detected. There is no evidence of an atrial septal defect.   Diastology LV e' lateral:   5.87 cm/s LV E/e' lateral: 11.5 LV e' medial:    4.90 cm/s LV E/e' medial:  13.7  RIGHT VENTRICLE RV S prime:     28.60 cm/s TAPSE (M-mode): 2.1 cm LEFT ATRIUM             Index       RIGHT ATRIUM           Index LA Vol (A2C):   30.0 ml 16.31 ml/m RA Area:     12.10 cm LA Vol (A4C):   21.2 ml 11.53 ml/m RA Volume:   30.10 ml  16.37 ml/m LA Biplane Vol: 25.3 ml 13.76 ml/m  AORTIC VALVE LVOT Vmax:   70.30 cm/s LVOT Vmean:  50.200 cm/s LVOT VTI:    0.137 m MITRAL VALVE                        TRICUSPID VALVE MV Area (PHT): 2.96 cm             TR Peak grad:   24.2 mmHg MV PHT:        74.24 msec           TR Vmax:        246.00 cm/s MV Decel Time: 256 msec MV E velocity: 67.30 cm/s 103 cm/s  SHUNTS MV A velocity: 65.10 cm/s 70.3 cm/s Systemic VTI: 0.14 m MV E/A ratio:  1.03       1.5  Skeet Latch MD Electronically signed by Skeet Latch MD Signature Date/Time: 01/02/2020/6:33:38 PM    Final    Assessment and Plan:   1.  Moderate pericardial effusion -We were asked to evaluate Mr. Edmon Crape pericardial effusion.  On my review this is a moderate pericardial effusion measuring up to 1.8 cm.  Per the ASE guidelines a moderate effusion is up to 2 cm.  He has no evidence of RV diastolic collapse.  He has no evidence of respiratory variation across the mitral valve which is a more specific marker of tamponade.  His clinical examination demonstrates  normotension and normal sinus rhythm without tachycardia.  He has no evidence of jugular venous distention.   Clinically, there is no evidence of cardiac tamponade. -Overall, this is a moderate pericardial effusion likely related to his underlying malignancy.  This has been seen on prior CT abdomen/pelvis.  I suspect this is a chronic effusion especially as he has fibrous stranding in the pericardial space.   -There is no indication for pericardial drainage at this time.  There is no evidence of tamponade.  2. Paroxysmal atrial fib -No evidence of recurrence of his atrial fibrillation. -Would recommend to continue his Eliquis for now -Continue home diltiazem  [facility] will sign off.   Medication Recommendations: No specific recommendations Other recommendations (labs, testing, etc): None Follow up as an outpatient: No specific follow-up needed other than what is already planned  Signed, [place] Bells T. [facility], [place]  01/03/2020 11:25 AM

## 2020-01-03 NOTE — Progress Notes (Signed)
Initial Nutrition Assessment  RD working remotely.   DOCUMENTATION CODES:   Not applicable  INTERVENTION:  - diet advancement as medically feasible.   NUTRITION DIAGNOSIS:   Increased nutrient needs related to acute illness, catabolic FXTKWIO(XBDZH-29 infection) as evidenced by estimated needs.  GOAL:   Patient will meet greater than or equal to 90% of their needs  MONITOR:   Diet advancement, Labs, Weight trends  REASON FOR ASSESSMENT:   Malnutrition Screening Tool  ASSESSMENT:   79 y.o. male with medical history of stage IV R-sided NSCLC, SIADH, A. fib, hypothyroidism, depression, and anxiety. He presented to the ED due to sudden onset of abdomen pain since 1/27. Pain was located mainly in the LLQ. He did not report of any nausea or vomiting. Patient's wife recently tested positive for COVID-19.  Patient has been NPO since admission. Per chart review, weight on 1/28 was 145 lb and weight has been stable since 06/11/19.  Per notes: - COVID-19 positive--afebrile and no respiratory symptoms - acute diverticulitis--plan to advance to CLD of abdominal pain continues to improve - hyponatremia--chronic, symptomatic   Labs reviewed; Na: 131 mmol/l, Ca: 8.7 mg/dl. Medications reviewed; 500 mg ascorbic acid BID, 50 mcg oral synthroid/day, 100 mg oral thiamine/day, 220 mg oral zinc sulfate/day.  IVF; NS @ 100 ml/hr.     NUTRITION - FOCUSED PHYSICAL EXAM:  unable to complete for COVID+ patient.   Diet Order:   Diet Order            Diet NPO time specified Except for: Sips with Meds  Diet effective now              EDUCATION NEEDS:   No education needs have been identified at this time  Skin:  Skin Assessment: Reviewed RN Assessment  Last BM:  1/29  Height:   Ht Readings from Last 1 Encounters:  01/02/20 5\' 11"  (1.803 m)    Weight:   Wt Readings from Last 1 Encounters:  01/02/20 65.8 kg    Ideal Body Weight:  78.2 kg  BMI:  Body mass index is 20.22  kg/m.  Estimated Nutritional Needs:   Kcal:  1840-2105 kcal  Protein:  92-105 grams  Fluid:  >/= 2 L/day     Jarome Matin, MS, RD, LDN, [facility] Inpatient Clinical Dietitian Pager # [phone] After hours/weekend pager # [phone]

## 2020-01-04 DIAGNOSIS — I10 Essential (primary) hypertension: Secondary | ICD-10-CM

## 2020-01-04 LAB — CBC
HCT: 41.5 % (ref 39.0–52.0)
Hemoglobin: 14 g/dL (ref 13.0–17.0)
MCH: 30.3 pg (ref 26.0–34.0)
MCHC: 33.7 g/dL (ref 30.0–36.0)
MCV: 89.8 fL (ref 80.0–100.0)
Platelets: 238 10*3/uL (ref 150–400)
RBC: 4.62 MIL/uL (ref 4.22–5.81)
RDW: 14.3 % (ref 11.5–15.5)
WBC: 12.5 10*3/uL — ABNORMAL HIGH (ref 4.0–10.5)
nRBC: 0 % (ref 0.0–0.2)

## 2020-01-04 LAB — COMPREHENSIVE METABOLIC PANEL
ALT: 15 U/L (ref 0–44)
AST: 25 U/L (ref 15–41)
Albumin: 3 g/dL — ABNORMAL LOW (ref 3.5–5.0)
Alkaline Phosphatase: 63 U/L (ref 38–126)
Anion gap: 10 (ref 5–15)
BUN: 20 mg/dL (ref 8–23)
CO2: 21 mmol/L — ABNORMAL LOW (ref 22–32)
Calcium: 8.3 mg/dL — ABNORMAL LOW (ref 8.9–10.3)
Chloride: 100 mmol/L (ref 98–111)
Creatinine, Ser: 0.81 mg/dL (ref 0.61–1.24)
GFR calc Af Amer: >60 mL/min (ref >60)
GFR calc non Af Amer: >60 mL/min (ref >60)
Glucose, Bld: 107 mg/dL — ABNORMAL HIGH (ref 70–99)
Potassium: 3.7 mmol/L (ref 3.5–5.1)
Sodium: 131 mmol/L — ABNORMAL LOW (ref 135–145)
Total Bilirubin: 1.3 mg/dL — ABNORMAL HIGH (ref 0.3–1.2)
Total Protein: 5.6 g/dL — ABNORMAL LOW (ref 6.5–8.1)

## 2020-01-04 LAB — C-REACTIVE PROTEIN: CRP: 17.9 mg/dL — ABNORMAL HIGH (ref <1.0)

## 2020-01-04 LAB — D-DIMER, QUANTITATIVE: D-Dimer, Quant: 2.37 ug/mL-FEU — ABNORMAL HIGH (ref 0.00–0.50)

## 2020-01-04 LAB — FERRITIN: Ferritin: 167 ng/mL (ref 24–336)

## 2020-01-04 MED ORDER — HYDROCODONE-ACETAMINOPHEN 5-325 MG PO TABS
1.0000 | ORAL_TABLET | ORAL | Status: DC | PRN
  Administered 2020-01-04 (×3): 1 via ORAL
  Filled 2020-01-04 (×3): qty 1

## 2020-01-04 MED ORDER — HYDROMORPHONE HCL 1 MG/ML IJ SOLN
1.0000 mg | INTRAMUSCULAR | Status: DC | PRN
  Administered 2020-01-04 – 2020-01-06 (×5): 1 mg via INTRAVENOUS
  Filled 2020-01-04 (×5): qty 1

## 2020-01-04 NOTE — Progress Notes (Signed)
Patient is resting in bed with no signs or symptoms of distress. Will continue to monitor.

## 2020-01-04 NOTE — Progress Notes (Addendum)
[facility]                                                                              Patient Demographics  Anthony Curry, is a 79 y.o. male, DOB - 05-Nov-1941, SWF:[id number]  Admit date - 01/02/2020   Admitting Physician Shelly Coss, MD  Outpatient Primary MD for the patient is [name], MD  Outpatient specialists:   LOS - 2  days   Medical records reviewed and are as summarized below:    Chief Complaint  Patient presents with  . Abdominal Pain       Brief summary   Patient is a 79 year old male with history of stage IV non-small cell lung CA, SIADH, paroxysmal A. fib, hypothyroidism, depression, anxiety presents from home with c/o LLQ abdominal pain x 1 day associated with diarrhea. Patient follows with Dr. [name] for right non-small cell lung cancer and is currently on chemotherapy. He lives with his wife and daughter. He is functional and ambulatory at baseline. His wife recently tested positive for Covid and was admitted at [facility]. His COVID screen test from [facility] resulted positive. CT abdomen showed acute diverticulitis at the junction of distal descending and sigmoid colon.  No abscess or perforation.  Assessment & Plan    Principal Problem: Acute diverticulitis -CT abdomen showed acute diverticulitis at the junction of distal descending and sigmoid colon, no abscess or perforation. -Still continues to complain of abdominal pain in the left lower quadrant. -Continue full liquid diet, will advance in a.m. if tolerated -Will need interval colonoscopy in 6 to 8 weeks to rule out any colon mass/mets -Continue IV Zosyn.  If spikes fevers, worsening leukocytosis or pain, will repeat CT abdomen in a.m.  Active Problems:  COVID-19 viral illness -Underlying history of emphysema. ongoing infection with acute diverticulitis, has lung CA, immunosuppressed -Currently no hypoxia or respiratory symptoms.  Overnight temp of 102.3 F. -O2 sats 98% on  room air, not on remdesivir.  Decadron was discontinued -CRP still elevated at 17.9, D-dimer 2.37, trending up ferritin 167    Hyponatremia -Has fluctuating sodium levels 1 28-1 35, likely associated with SIADH related to lung CA -Continue demeclocycline, sodium stable at 131  History of stage IV non-small cell lung CA -Patient was scheduled for a chemo infusion day prior to admission, but was canceled due to Covid -Oncology has been informed  Paroxysmal atrial fibrillation (HCC)  -Currently normal sinus rhythm, continue Eliquis -Continue Cardizem for rate control  Pericardial effusion -CT scan showed mild to moderate pericardial effusion, possibly associated with his malignancy -Echo read as grade 1 diastolic dysfunction with preserved EF, large pericardial effusion anterior to right ventricle with the motion of the right ventricle wall -Asymptomatic, cardiology was consulted, seen by Dr. Davina Poke, moderate effusion possibly due to malignancy, continue anticoagulation, no acute interventions recommended  BPH/urinary retention -Continue tamsulosin, Foley catheter placed  Hypothyroidism Continue Synthroid  Mild protein calorie malnutrition Continue protein supplements   Code Status: Full DVT Prophylaxis: Apixaban Family Communication: Discussed all imaging results, lab results, explained to the patient    Disposition Plan: Patient from home.  Once abdominal pain is controlled, no  fevers, tolerating solid diet, will DC home  Time Spent in minutes   35 minutes  Procedures:  None  Consultants:   Cardiology  Antimicrobials:   Anti-infectives (From admission, onward)   Start     Dose/Rate Route Frequency Ordered Stop   01/02/20 2000  piperacillin-tazobactam (ZOSYN) IVPB 3.375 g     3.375 g 12.5 mL/hr over 240 Minutes Intravenous Every 8 hours 01/02/20 1415     01/02/20 1500  demeclocycline (DECLOMYCIN) tablet 150 mg    Note to Pharmacy: Take 2 tablets twice a day      150 mg Oral 2 times daily 01/02/20 1408     01/02/20 1345  piperacillin-tazobactam (ZOSYN) IVPB 3.375 g     3.375 g 100 mL/hr over 30 Minutes Intravenous  Once 01/02/20 1331 01/02/20 1528          Medications  Scheduled Meds: . apixaban  5 mg Oral BID  . vitamin C  500 mg Oral BID  . chlorhexidine  15 mL Mouth Rinse BID  . Chlorhexidine Gluconate Cloth  6 each Topical Daily  . demeclocycline  150 mg Oral BID  . diltiazem  180 mg Oral Q1200  . fluticasone  2 spray Each Nare QHS  . levothyroxine  50 mcg Oral QAC breakfast  . mouth rinse  15 mL Mouth Rinse q12n4p  . mirtazapine  30 mg Oral QHS  . tamsulosin  0.4 mg Oral BID  . thiamine  100 mg Oral Daily  . zinc sulfate  220 mg Oral Daily   Continuous Infusions: . sodium chloride 100 mL/hr at 01/03/20 1124  . piperacillin-tazobactam (ZOSYN)  IV 3.375 g (01/04/20 0616)   PRN Meds:.acetaminophen, ALPRAZolam, hydrALAZINE, HYDROcodone-acetaminophen, HYDROmorphone (DILAUDID) injection, ondansetron (ZOFRAN) IV      Subjective:   Iman Orourke was seen and examined today.  States had a rough night, developed acute urinary retention, left lower quadrant abdominal pain still 7/10, better with pain medication. Patient denies dizziness, chest pain, shortness of breath, N/V/D/C.  Objective:   Vitals:   01/03/20 2133 01/03/20 2350 01/04/20 0517 01/04/20 0930  BP: (!) 164/70 (!) 140/58 (!) 117/54 (!) 111/59  Pulse: 96 93 77 78  Resp: 20 20 20    Temp: (!) 102.3 F (39.1 C) 99.9 F (37.7 C) 99.8 F (37.7 C) 98.7 F (37.1 C)  TempSrc: Oral Oral Oral Oral  SpO2: 96% 98% 95% 95%  Weight:      Height:        Intake/Output Summary (Last 24 hours) at 01/04/2020 1137 Last data filed at 01/04/2020 0600 Gross per 24 hour  Intake 1009 ml  Output 825 ml  Net 184 ml     Wt Readings from Last 3 Encounters:  01/02/20 65.8 kg  12/02/19 66.6 kg  10/29/19 65 kg     Exam  General: Alert and oriented x 3, NAD  Eyes:   HEENT:     Cardiovascular: S1 S2 auscultated, no murmurs, RRR  Respiratory: Clear to auscultation bilaterally  Gastrointestinal: Soft, mild TTP in LLQ, nondistended, + bowel sounds  Ext: no pedal edema bilaterally  Neuro: No new deficits  Musculoskeletal: No digital cyanosis, clubbing  Skin: No rashes  Psych: Normal affect and demeanor, alert and oriented x3    Data Reviewed:  I have personally reviewed following labs and imaging studies  Micro Results Recent Results (from the past 240 hour(s))  Novel Coronavirus, NAA ([facility])     Status: None   Collection Time: 12/27/19  1:41 PM  Specimen: Nasopharyngeal(NP) swabs in vial transport medium   NASOPHARYNGE  TESTING  Result Value Ref Range Status   SARS-CoV-2, NAA Not Detected Not Detected Final    Comment: This nucleic acid amplification test was developed and its performance characteristics determined by [employer]. Nucleic acid amplification tests include RT-PCR and TMA. This test has not been FDA cleared or approved. This test has been authorized by FDA under an Emergency Use Authorization (EUA). This test is only authorized for the duration of time the declaration that circumstances exist justifying the authorization of the emergency use of in vitro diagnostic tests for detection of SARS-CoV-2 virus and/or diagnosis of COVID-19 infection under section 564(b)(1) of the Act, 21 U.S.C. 400QQP-6(P) (1), unless the authorization is terminated or revoked sooner. When diagnostic testing is negative, the possibility of a false negative result should be considered in the context of a patient's recent exposures and the presence of clinical signs and symptoms consistent with COVID-19. An individual without symptoms of COVID-19 and who is not shedding SARS-CoV-2 virus wo uld expect to have a negative (not detected) result in this assay.   Novel Coronavirus, NAA ([facility])     Status: Abnormal   Collection Time: 01/01/20  2:24  PM   Specimen: Nasopharyngeal(NP) swabs in vial transport medium   NASOPHARYNGE  TESTING  Result Value Ref Range Status   SARS-CoV-2, NAA Detected (A) Not Detected Final    Comment: This nucleic acid amplification test was developed and its performance characteristics determined by [employer]. Nucleic acid amplification tests include RT-PCR and TMA. This test has not been FDA cleared or approved. This test has been authorized by FDA under an Emergency Use Authorization (EUA). This test is only authorized for the duration of time the declaration that circumstances exist justifying the authorization of the emergency use of in vitro diagnostic tests for detection of SARS-CoV-2 virus and/or diagnosis of COVID-19 infection under section 564(b)(1) of the Act, 21 U.S.C. 950DTO-6(Z) (1), unless the authorization is terminated or revoked sooner. When diagnostic testing is negative, the possibility of a false negative result should be considered in the context of a patient's recent exposures and the presence of clinical signs and symptoms consistent with COVID-19. An individual without symptoms of COVID-19 and who is not shedding SARS-CoV-2 virus wo uld expect to have a negative (not detected) result in this assay.   SARS CORONAVIRUS 2 (TAT 6-24 HRS) Nasopharyngeal Nasopharyngeal Swab     Status: Abnormal   Collection Time: 01/02/20  2:29 PM   Specimen: Nasopharyngeal Swab  Result Value Ref Range Status   SARS Coronavirus 2 POSITIVE (A) NEGATIVE Final    Comment: RESULT CALLED TO, READ BACK BY AND VERIFIED WITH: T BAKER,RN 2015 01/02/2020 D BRADLEY (NOTE) SARS-CoV-2 target nucleic acids are DETECTED. The SARS-CoV-2 RNA is generally detectable in upper and lower respiratory specimens during the acute phase of infection. Positive results are indicative of the presence of SARS-CoV-2 RNA. Clinical correlation with patient history and other diagnostic information is  necessary to  determine patient infection status. Positive results do not rule out bacterial infection or co-infection with other viruses.  The expected result is Negative. Fact Sheet for Patients: SugarRoll.be Fact Sheet for Healthcare Providers: https://www.woods-mathews.com/ This test is not yet approved or cleared by the [place] FDA and  has been authorized for detection and/or diagnosis of SARS-CoV-2 by FDA under an Emergency Use Authorization (EUA). This EUA will remain  in effect (meaning this test can be used) for the  duration of the COVID-19 declaration under Section 564(b)(1) of the Act, 21 U.S.C. section 360bbb-3(b)(1), unless the authorization is terminated or revoked sooner. Performed at [facility], [place] [address] [place], [place] [address]   Culture, blood (routine x 2)     Status: None (Preliminary result)   Collection Time: 01/03/20 10:10 PM   Specimen: BLOOD  Result Value Ref Range Status   Specimen Description   Final    BLOOD RIGHT ANTECUBITAL Performed at [facility], [place] [address], [place], [place] [address]    Special Requests   Final    BOTTLES DRAWN AEROBIC AND ANAEROBIC Blood Culture adequate volume Performed at [facility] [name], [place], [place] [address]    Culture PENDING  Incomplete   Report Status PENDING  Incomplete    Radiology Reports CT ABDOMEN PELVIS W CONTRAST  Result Date: 01/02/2020 CLINICAL DATA:  Lower abdominal pain. The patient is on immunotherapy for lung carcinoma. EXAM: CT ABDOMEN AND PELVIS WITH CONTRAST TECHNIQUE: Multidetector CT imaging of the abdomen and pelvis was performed using the standard protocol following bolus administration of intravenous contrast. CONTRAST:  100 mL OMNIPAQUE IOHEXOL 300 MG/ML  SOLN COMPARISON:  CT chest, abdomen and pelvis 05/13/2019. FINDINGS: Lower chest: No pleural effusion. Small pericardial effusion is unchanged. Lung  bases are emphysematous. There is mild dependent atelectasis. Hepatobiliary: A few small low attenuating lesions in the liver likely representing cysts are unchanged. Status post cholecystectomy. Biliary tree unremarkable. Pancreas: Unremarkable. No pancreatic ductal dilatation or surrounding inflammatory changes. Spleen: Normal in size without focal abnormality. 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 The left adrenal gland appears normal. There is some scarring in the right kidney which is unchanged. The kidneys otherwise appear normal. No stones or hydronephrosis. The urinary bladder is distended but otherwise unremarkable. Stomach/Bowel: Diverticulosis is identified. There is thickening and stranding near the junction of the descending and sigmoid consistent with acute diverticulitis. No abscess or perforation. The stomach and small bowel appear normal. The appendix is normal in appearance. Note is made that the cecum is positioned in the right upper quadrant. Vascular/Lymphatic: Aortic atherosclerosis. No enlarged abdominal or pelvic lymph nodes. Reproductive: Prostate is unremarkable. Other: None. Musculoskeletal: No acute or focal abnormality. Severe degenerative change throughout the visualized spine is noted. IMPRESSION: Diverticulosis with acute diverticulitis the junction of the distal descending and sigmoid colon. No abscess or perforation. No change in a moderate pericardial effusion. Emphysema. Extensive atherosclerosis. Electronically Signed   By: Inge Rise M.D.   On: 01/02/2020 13:10   DG CHEST PORT 1 VIEW  Result Date: 01/02/2020 CLINICAL DATA:  79 year old male with stage IV lung cancer, SI ADH. Undergoing chemotherapy. Positive COVID-19 yesterday. EXAM: PORTABLE CHEST 1 VIEW COMPARISON:  CT Abdomen and Pelvis earlier today. Chest CT 09/02/2019 and earlier. FINDINGS: Portable AP semi upright view at 1741 hours. Stable lung volumes and mediastinal contours since  2020. Numerous chronic right rib fractures. Chronic reticular opacity in the right upper lobe and near the left hilum. Elsewhere when allowing for portable technique the lungs are clear. No pneumothorax, pulmonary edema or pleural effusion. Stable visualized osseous structures. IMPRESSION: Stable radiographic appearance of chronic lung disease since July 2020. No acute cardiopulmonary abnormality. Electronically Signed   By: Genevie Ann M.D.   On: 01/02/2020 18:38   ECHOCARDIOGRAM COMPLETE  Result Date: 01/02/2020   ECHOCARDIOGRAM REPORT   Patient Name:   Vaiden Adames. Date of Exam: 01/02/2020 Medical Rec #:  [id number]           Height:       71.0 in Accession #:    [id number]          Weight:       145.0 lb Date of Birth:  [birth year]          BSA:          1.84 m Patient Age:    46 years            BP:           152/65 mmHg Patient Gender: M                   HR:           82 bpm. Exam Location:  Inpatient Procedure: 2D Echo, Cardiac Doppler and Color Doppler Indications:    Pericardial effusion 423.9  History:        Patient has prior history of Echocardiogram examinations, most                 recent 07/26/2014. Arrythmias:Atrial Fibrillation and Atrial                 Flutter; Risk Factors:Hypertension and Former Smoker. GERD.  Sonographer:    Paulita Fujita RDCS Referring Phys: [id number] AMRIT ADHIKARI  Sonographer Comments: Image acquisition challenging due to patient body habitus. IMPRESSIONS  1. Left ventricular ejection fraction, by visual estimation, is 60 to 65%. The left ventricle has normal function. There is no left ventricular hypertrophy.  2. Left ventricular diastolic parameters are consistent with Grade I diastolic dysfunction (impaired relaxation).  3. Global right ventricle has normal systolic function.The right ventricular size is normal. No increase in right ventricular wall thickness.  4. Left atrial size was normal.  5. Right atrial size was normal.  6. Large pericardial effusion.  7. The  pericardial effusion is anterior to the right ventricle.  8. There is inversion of the right ventricular wall and excessive respiratory variation in the tricuspid valve spectral Doppler velocities.  9. The mitral valve is normal in structure. Mild mitral valve regurgitation. No evidence of mitral stenosis. 10. The tricuspid valve is normal in structure. 11. The tricuspid valve is normal in structure. Tricuspid valve regurgitation is mild. 12. The aortic valve is normal in structure. Aortic valve regurgitation is not visualized. No evidence of aortic valve sclerosis or stenosis. 13. The pulmonic valve was normal in structure. Pulmonic valve regurgitation is not visualized. 14. Mildly elevated pulmonary artery systolic pressure. 15. The inferior vena cava is normal in size with greater than 50% respiratory variability, suggesting right atrial pressure of 3 mmHg. FINDINGS  Left Ventricle: Left ventricular ejection fraction, by visual estimation, is 60 to 65%. The left ventricle has normal function. The left ventricle is not well visualized. There is no left ventricular hypertrophy. Left ventricular diastolic parameters are consistent with Grade I diastolic dysfunction (impaired relaxation). Indeterminate filling pressures. Right Ventricle: The right ventricular size is normal. No increase in right ventricular wall thickness. Global RV systolic function is has normal systolic function. The tricuspid regurgitant velocity is 2.46 m/s, and with an assumed right atrial pressure  of 8 mmHg, the estimated right ventricular systolic pressure is mildly elevated at 32.2 mmHg. Left Atrium: Left atrial size was normal in size. Right Atrium: Right atrial size was normal in size Pericardium: A large pericardial effusion is present. The pericardial effusion is anterior to the right ventricle. There is inversion of  the right ventricular wall and excessive respiratory variation in the tricuspid valve spectral Doppler velocities.  Pericardial effusion measuring up to 1.86 cm. Unable to visualize the IVC. Findings may be con. Mitral Valve: The mitral valve is normal in structure. Mild mitral valve regurgitation. No evidence of mitral valve stenosis by observation. Tricuspid Valve: The tricuspid valve is normal in structure. Tricuspid valve regurgitation is mild. Aortic Valve: The aortic valve is normal in structure. Aortic valve regurgitation is not visualized. The aortic valve is structurally normal, with no evidence of sclerosis or stenosis. Pulmonic Valve: The pulmonic valve was normal in structure. Pulmonic valve regurgitation is not visualized. Pulmonic regurgitation is not visualized. Aorta: The aortic root, ascending aorta and aortic arch are all structurally normal, with no evidence of dilitation or obstruction. Venous: The inferior vena cava was not well visualized. The inferior vena cava is normal in size with greater than 50% respiratory variability, suggesting right atrial pressure of 3 mmHg. IAS/Shunts: No atrial level shunt detected by color flow Doppler. There is no evidence of a patent foramen ovale. No ventricular septal defect is seen or detected. There is no evidence of an atrial septal defect.   Diastology LV e' lateral:   5.87 cm/s LV E/e' lateral: 11.5 LV e' medial:    4.90 cm/s LV E/e' medial:  13.7  RIGHT VENTRICLE RV S prime:     28.60 cm/s TAPSE (M-mode): 2.1 cm LEFT ATRIUM             Index       RIGHT ATRIUM           Index LA Vol (A2C):   30.0 ml 16.31 ml/m RA Area:     12.10 cm LA Vol (A4C):   21.2 ml 11.53 ml/m RA Volume:   30.10 ml  16.37 ml/m LA Biplane Vol: 25.3 ml 13.76 ml/m  AORTIC VALVE LVOT Vmax:   70.30 cm/s LVOT Vmean:  50.200 cm/s LVOT VTI:    0.137 m MITRAL VALVE                        TRICUSPID VALVE MV Area (PHT): 2.96 cm             TR Peak grad:   24.2 mmHg MV PHT:        74.24 msec           TR Vmax:        246.00 cm/s MV Decel Time: 256 msec MV E velocity: 67.30 cm/s 103 cm/s  SHUNTS MV A  velocity: 65.10 cm/s 70.3 cm/s Systemic VTI: 0.14 m MV E/A ratio:  1.03       1.5  Skeet Latch MD Electronically signed by Skeet Latch MD Signature Date/Time: 01/02/2020/6:33:38 PM    Final     Lab Data:  CBC: Recent Labs  Lab 01/02/20 1041 01/03/20 0330 01/04/20 0225  WBC 8.5 9.2 12.5*  NEUTROABS 7.0  --   --   HGB 14.6 14.7 14.0  HCT 42.4 43.2 41.5  MCV 89.1 91.1 89.8  PLT 222 206 607   Basic Metabolic Panel: Recent Labs  Lab 01/02/20 1041 01/03/20 0330 01/04/20 0225  NA 128* 131* 131*  K 4.2 4.3 3.7  CL 93* 98 100  CO2 26 23 21*  GLUCOSE 127* 146* 107*  BUN 15 16 20   CREATININE 0.88 0.82 0.81  CALCIUM 9.0 8.7* 8.3*   GFR: Estimated Creatinine Clearance: 67.7 mL/min (by C-G formula based on  SCr of 0.81 mg/dL). Liver Function Tests: Recent Labs  Lab 01/02/20 1041 01/03/20 0330 01/04/20 0225  AST 26 23 25   ALT 21 18 15   ALKPHOS 88 71 63  BILITOT 1.4* 0.9 1.3*  PROT 6.5 5.8* 5.6*  ALBUMIN 3.7 3.3* 3.0*   Recent Labs  Lab 01/02/20 1041  LIPASE 19   No results for input(s): AMMONIA in the last 168 hours. Coagulation Profile: No results for input(s): INR, PROTIME in the last 168 hours. Cardiac Enzymes: No results for input(s): CKTOTAL, CKMB, CKMBINDEX, TROPONINI in the last 168 hours. BNP (last 3 results) No results for input(s): PROBNP in the last 8760 hours. HbA1C: No results for input(s): HGBA1C in the last 72 hours. CBG: No results for input(s): GLUCAP in the last 168 hours. Lipid Profile: No results for input(s): CHOL, HDL, LDLCALC, TRIG, CHOLHDL, LDLDIRECT in the last 72 hours. Thyroid Function Tests: No results for input(s): TSH, T4TOTAL, FREET4, T3FREE, THYROIDAB in the last 72 hours. Anemia Panel: Recent Labs    01/03/20 0330 01/04/20 0225  FERRITIN 149 167   Urine analysis:    Component Value Date/Time   COLORURINE YELLOW 01/02/2020 1227   APPEARANCEUR CLEAR 01/02/2020 1227   LABSPEC 1.006 01/02/2020 1227   PHURINE 9.0  (H) 01/02/2020 1227   GLUCOSEU NEGATIVE 01/02/2020 1227   HGBUR NEGATIVE 01/02/2020 [place] 01/02/2020 [place] 01/02/2020 1227   PROTEINUR NEGATIVE 01/02/2020 1227   UROBILINOGEN 0.2 09/10/2015 0940   NITRITE NEGATIVE 01/02/2020 1227   LEUKOCYTESUR NEGATIVE 01/02/2020 1227     Anicia Leuthold M.D. [facility] 01/04/2020, 11:37 AM   Call night coverage person covering after 7pm

## 2020-01-04 NOTE — Progress Notes (Signed)
Sent patient's concerns about pain management to Dr Kennon Holter for review.

## 2020-01-05 ENCOUNTER — Inpatient Hospital Stay (HOSPITAL_COMMUNITY): Payer: Medicare Other

## 2020-01-05 ENCOUNTER — Encounter (HOSPITAL_COMMUNITY)
Admission: EM | Disposition: A | Discharge status: Skilled Nursing Facility | Payer: Self-pay | Source: Home / Self Care | Attending: Internal Medicine

## 2020-01-05 ENCOUNTER — Inpatient Hospital Stay (HOSPITAL_COMMUNITY): Payer: Medicare Other | Admitting: Certified Registered Nurse Anesthetist

## 2020-01-05 HISTORY — PX: LAPAROTOMY: SHX154

## 2020-01-05 LAB — COMPREHENSIVE METABOLIC PANEL
ALT: 16 U/L (ref 0–44)
AST: 21 U/L (ref 15–41)
Albumin: 2.3 g/dL — ABNORMAL LOW (ref 3.5–5.0)
Alkaline Phosphatase: 64 U/L (ref 38–126)
Anion gap: 8 (ref 5–15)
BUN: 18 mg/dL (ref 8–23)
CO2: 21 mmol/L — ABNORMAL LOW (ref 22–32)
Calcium: 7.7 mg/dL — ABNORMAL LOW (ref 8.9–10.3)
Chloride: 101 mmol/L (ref 98–111)
Creatinine, Ser: 0.72 mg/dL (ref 0.61–1.24)
GFR calc Af Amer: >60 mL/min (ref >60)
GFR calc non Af Amer: >60 mL/min (ref >60)
Glucose, Bld: 83 mg/dL (ref 70–99)
Potassium: 3.3 mmol/L — ABNORMAL LOW (ref 3.5–5.1)
Sodium: 130 mmol/L — ABNORMAL LOW (ref 135–145)
Total Bilirubin: 1 mg/dL (ref 0.3–1.2)
Total Protein: 4.9 g/dL — ABNORMAL LOW (ref 6.5–8.1)

## 2020-01-05 LAB — CBC WITH DIFFERENTIAL/PLATELET
Abs Immature Granulocytes: 0.08 10*3/uL — ABNORMAL HIGH (ref 0.00–0.07)
Basophils Absolute: 0 10*3/uL (ref 0.0–0.1)
Basophils Relative: 0 %
Eosinophils Absolute: 0 10*3/uL (ref 0.0–0.5)
Eosinophils Relative: 0 %
HCT: 34.7 % — ABNORMAL LOW (ref 39.0–52.0)
Hemoglobin: 12 g/dL — ABNORMAL LOW (ref 13.0–17.0)
Immature Granulocytes: 1 %
Lymphocytes Relative: 1 %
Lymphs Abs: 0.1 10*3/uL — ABNORMAL LOW (ref 0.7–4.0)
MCH: 30.7 pg (ref 26.0–34.0)
MCHC: 34.6 g/dL (ref 30.0–36.0)
MCV: 88.7 fL (ref 80.0–100.0)
Monocytes Absolute: 0.6 10*3/uL (ref 0.1–1.0)
Monocytes Relative: 4 %
Neutro Abs: 14.1 10*3/uL — ABNORMAL HIGH (ref 1.7–7.7)
Neutrophils Relative %: 94 %
Platelets: 227 10*3/uL (ref 150–400)
RBC: 3.91 MIL/uL — ABNORMAL LOW (ref 4.22–5.81)
RDW: 14.1 % (ref 11.5–15.5)
WBC: 14.9 10*3/uL — ABNORMAL HIGH (ref 4.0–10.5)
nRBC: 0 % (ref 0.0–0.2)

## 2020-01-05 LAB — CBC
HCT: 39.2 % (ref 39.0–52.0)
Hemoglobin: 13.4 g/dL (ref 13.0–17.0)
MCH: 30.9 pg (ref 26.0–34.0)
MCHC: 34.2 g/dL (ref 30.0–36.0)
MCV: 90.3 fL (ref 80.0–100.0)
Platelets: 242 10*3/uL (ref 150–400)
RBC: 4.34 MIL/uL (ref 4.22–5.81)
RDW: 14.4 % (ref 11.5–15.5)
WBC: 12.8 10*3/uL — ABNORMAL HIGH (ref 4.0–10.5)
nRBC: 0 % (ref 0.0–0.2)

## 2020-01-05 LAB — D-DIMER, QUANTITATIVE: D-Dimer, Quant: 4.48 ug/mL-FEU — ABNORMAL HIGH (ref 0.00–0.50)

## 2020-01-05 LAB — FERRITIN: Ferritin: 194 ng/mL (ref 24–336)

## 2020-01-05 LAB — SURGICAL PCR SCREEN
MRSA, PCR: NEGATIVE
Staphylococcus aureus: NEGATIVE

## 2020-01-05 LAB — C-REACTIVE PROTEIN: CRP: 22.8 mg/dL — ABNORMAL HIGH (ref <1.0)

## 2020-01-05 LAB — BRAIN NATRIURETIC PEPTIDE: B Natriuretic Peptide: 109.6 pg/mL — ABNORMAL HIGH (ref 0.0–100.0)

## 2020-01-05 LAB — TYPE AND SCREEN
ABO/RH(D): O POS
Antibody Screen: NEGATIVE

## 2020-01-05 LAB — ABO/RH: ABO/RH(D): O POS

## 2020-01-05 SURGERY — LAPAROTOMY, EXPLORATORY
Anesthesia: General | Site: Abdomen

## 2020-01-05 MED ORDER — LACTATED RINGERS IV SOLN: INTRAVENOUS | Status: DC | PRN

## 2020-01-05 MED ORDER — DEXAMETHASONE SODIUM PHOSPHATE 10 MG/ML IJ SOLN
INTRAMUSCULAR | Status: DC | PRN
  Administered 2020-01-05: 10 mg via INTRAVENOUS

## 2020-01-05 MED ORDER — SUGAMMADEX SODIUM 200 MG/2ML IV SOLN
INTRAVENOUS | Status: DC | PRN
  Administered 2020-01-05: 180 mg via INTRAVENOUS

## 2020-01-05 MED ORDER — ONDANSETRON HCL 4 MG/2ML IJ SOLN
INTRAMUSCULAR | Status: AC
  Filled 2020-01-05: qty 2

## 2020-01-05 MED ORDER — ALBUTEROL SULFATE HFA 108 (90 BASE) MCG/ACT IN AERS
2.0000 | INHALATION_SPRAY | RESPIRATORY_TRACT | Status: DC | PRN
  Filled 2020-01-05: qty 6.7

## 2020-01-05 MED ORDER — SUCCINYLCHOLINE CHLORIDE 200 MG/10ML IV SOSY
PREFILLED_SYRINGE | INTRAVENOUS | Status: DC | PRN
  Administered 2020-01-05: 120 mg via INTRAVENOUS

## 2020-01-05 MED ORDER — SUCCINYLCHOLINE CHLORIDE 200 MG/10ML IV SOSY
PREFILLED_SYRINGE | INTRAVENOUS | Status: AC
  Filled 2020-01-05: qty 10

## 2020-01-05 MED ORDER — ACETAMINOPHEN 10 MG/ML IV SOLN
INTRAVENOUS | Status: AC
  Filled 2020-01-05: qty 100

## 2020-01-05 MED ORDER — ONDANSETRON HCL 4 MG/2ML IJ SOLN: 4.0000 mg | Freq: Once | INTRAMUSCULAR | Status: DC | PRN

## 2020-01-05 MED ORDER — DEXAMETHASONE SODIUM PHOSPHATE 10 MG/ML IJ SOLN
INTRAMUSCULAR | Status: AC
  Filled 2020-01-05: qty 1

## 2020-01-05 MED ORDER — ROCURONIUM BROMIDE 10 MG/ML (PF) SYRINGE
PREFILLED_SYRINGE | INTRAVENOUS | Status: AC
  Filled 2020-01-05: qty 10

## 2020-01-05 MED ORDER — SODIUM CHLORIDE 0.9% IV SOLUTION: Freq: Once | INTRAVENOUS | Status: DC

## 2020-01-05 MED ORDER — SODIUM CHLORIDE 0.9 % IV BOLUS
500.0000 mL | Freq: Once | INTRAVENOUS | Status: AC
  Administered 2020-01-05: 05:00:00 500 mL via INTRAVENOUS

## 2020-01-05 MED ORDER — FENTANYL CITRATE (PF) 100 MCG/2ML IJ SOLN
25.0000 ug | INTRAMUSCULAR | Status: DC | PRN
  Administered 2020-01-05: 18:00:00 50 ug via INTRAVENOUS
  Administered 2020-01-05 (×2): 25 ug via INTRAVENOUS

## 2020-01-05 MED ORDER — BUPIVACAINE HCL 0.25 % IJ SOLN
INTRAMUSCULAR | Status: DC | PRN
  Administered 2020-01-05: 30 mL

## 2020-01-05 MED ORDER — FENTANYL CITRATE (PF) 250 MCG/5ML IJ SOLN
INTRAMUSCULAR | Status: DC | PRN
  Administered 2020-01-05: 50 ug via INTRAVENOUS
  Administered 2020-01-05: 100 ug via INTRAVENOUS

## 2020-01-05 MED ORDER — MOMETASONE FURO-FORMOTEROL FUM 100-5 MCG/ACT IN AERO
2.0000 | INHALATION_SPRAY | Freq: Two times a day (BID) | RESPIRATORY_TRACT | Status: DC
  Administered 2020-01-05 – 2020-01-10 (×9): 2 via RESPIRATORY_TRACT
  Filled 2020-01-05 (×3): qty 8.8

## 2020-01-05 MED ORDER — SODIUM CHLORIDE 0.9 % IV SOLN
100.0000 mg | Freq: Every day | INTRAVENOUS | Status: AC
  Administered 2020-01-06 – 2020-01-09 (×4): 100 mg via INTRAVENOUS
  Filled 2020-01-05 (×4): qty 20

## 2020-01-05 MED ORDER — OXYCODONE HCL 5 MG/5ML PO SOLN: 5.0000 mg | Freq: Once | ORAL | Status: DC | PRN

## 2020-01-05 MED ORDER — OXYCODONE HCL 5 MG PO TABS: 5.0000 mg | ORAL_TABLET | Freq: Once | ORAL | Status: DC | PRN

## 2020-01-05 MED ORDER — HEPARIN SODIUM (PORCINE) 5000 UNIT/ML IJ SOLN
5000.0000 [IU] | Freq: Three times a day (TID) | INTRAMUSCULAR | Status: DC
  Administered 2020-01-06: 5000 [IU] via SUBCUTANEOUS
  Filled 2020-01-05: qty 1

## 2020-01-05 MED ORDER — IOHEXOL 9 MG/ML PO SOLN
ORAL | Status: AC
  Filled 2020-01-05: qty 1000

## 2020-01-05 MED ORDER — EMPTY CONTAINERS FLEXIBLE MISC
900.0000 mg | Freq: Once | Status: AC
  Administered 2020-01-05: 400 mg via INTRAVENOUS
  Administered 2020-01-05: 500 mg via INTRAVENOUS
  Filled 2020-01-05: qty 90

## 2020-01-05 MED ORDER — BUPIVACAINE HCL (PF) 0.25 % IJ SOLN
INTRAMUSCULAR | Status: AC
  Filled 2020-01-05: qty 30

## 2020-01-05 MED ORDER — SODIUM CHLORIDE (PF) 0.9 % IJ SOLN
INTRAMUSCULAR | Status: AC
  Filled 2020-01-05: qty 50

## 2020-01-05 MED ORDER — METHYLPREDNISOLONE SODIUM SUCC 40 MG IJ SOLR
40.0000 mg | Freq: Two times a day (BID) | INTRAMUSCULAR | Status: DC
  Filled 2020-01-05: qty 1

## 2020-01-05 MED ORDER — HEPARIN SODIUM (PORCINE) 5000 UNIT/ML IJ SOLN: 5000.0000 [IU] | Freq: Three times a day (TID) | INTRAMUSCULAR | Status: DC

## 2020-01-05 MED ORDER — POTASSIUM CHLORIDE CRYS ER 20 MEQ PO TBCR
40.0000 meq | EXTENDED_RELEASE_TABLET | Freq: Once | ORAL | Status: AC
  Administered 2020-01-05: 07:00:00 40 meq via ORAL
  Filled 2020-01-05: qty 2

## 2020-01-05 MED ORDER — PHENYLEPHRINE HCL-NACL 20-0.9 MG/250ML-% IV SOLN
INTRAVENOUS | Status: DC | PRN
  Administered 2020-01-05: 50 ug/min via INTRAVENOUS

## 2020-01-05 MED ORDER — ACETAMINOPHEN 10 MG/ML IV SOLN
INTRAVENOUS | Status: DC | PRN
  Administered 2020-01-05: 1000 mg via INTRAVENOUS

## 2020-01-05 MED ORDER — OXYCODONE HCL 5 MG PO TABS
5.0000 mg | ORAL_TABLET | Freq: Four times a day (QID) | ORAL | Status: DC | PRN
  Administered 2020-01-06 – 2020-01-10 (×8): 5 mg via ORAL
  Filled 2020-01-05 (×8): qty 1

## 2020-01-05 MED ORDER — ROCURONIUM BROMIDE 10 MG/ML (PF) SYRINGE
PREFILLED_SYRINGE | INTRAVENOUS | Status: DC | PRN
  Administered 2020-01-05: 50 mg via INTRAVENOUS

## 2020-01-05 MED ORDER — FENTANYL CITRATE (PF) 250 MCG/5ML IJ SOLN
INTRAMUSCULAR | Status: AC
  Filled 2020-01-05: qty 5

## 2020-01-05 MED ORDER — IOHEXOL 300 MG/ML  SOLN
100.0000 mL | Freq: Once | INTRAMUSCULAR | Status: AC | PRN
  Administered 2020-01-05: 12:00:00 100 mL via INTRAVENOUS

## 2020-01-05 MED ORDER — ONDANSETRON HCL 4 MG/2ML IJ SOLN
INTRAMUSCULAR | Status: DC | PRN
  Administered 2020-01-05: 4 mg via INTRAVENOUS

## 2020-01-05 MED ORDER — LIDOCAINE 2% (20 MG/ML) 5 ML SYRINGE
INTRAMUSCULAR | Status: DC | PRN
  Administered 2020-01-05: 60 mg via INTRAVENOUS

## 2020-01-05 MED ORDER — LIDOCAINE 2% (20 MG/ML) 5 ML SYRINGE
INTRAMUSCULAR | Status: AC
  Filled 2020-01-05: qty 5

## 2020-01-05 MED ORDER — ALBUMIN HUMAN 5 % IV SOLN: INTRAVENOUS | Status: DC | PRN

## 2020-01-05 MED ORDER — PROPOFOL 10 MG/ML IV BOLUS
INTRAVENOUS | Status: AC
  Filled 2020-01-05: qty 20

## 2020-01-05 MED ORDER — IOHEXOL 9 MG/ML PO SOLN
500.0000 mL | ORAL | Status: AC
  Administered 2020-01-05 (×2): 500 mL via ORAL

## 2020-01-05 MED ORDER — KETAMINE HCL 10 MG/ML IJ SOLN
INTRAMUSCULAR | Status: DC | PRN
  Administered 2020-01-05: 40 mg via INTRAVENOUS

## 2020-01-05 MED ORDER — KETAMINE HCL 10 MG/ML IJ SOLN
INTRAMUSCULAR | Status: AC
  Filled 2020-01-05: qty 1

## 2020-01-05 MED ORDER — SODIUM CHLORIDE 0.9 % IV BOLUS: 1000.0000 mL | Freq: Once | INTRAVENOUS | Status: DC

## 2020-01-05 MED ORDER — PROPOFOL 10 MG/ML IV BOLUS
INTRAVENOUS | Status: DC | PRN
  Administered 2020-01-05: 70 mg via INTRAVENOUS

## 2020-01-05 MED ORDER — 0.9 % SODIUM CHLORIDE (POUR BTL) OPTIME
TOPICAL | Status: DC | PRN
  Administered 2020-01-05: 4000 mL

## 2020-01-05 MED ORDER — SODIUM CHLORIDE 0.9 % IV SOLN
200.0000 mg | Freq: Once | INTRAVENOUS | Status: AC
  Administered 2020-01-05: 10:00:00 200 mg via INTRAVENOUS
  Filled 2020-01-05: qty 200

## 2020-01-05 MED ORDER — IPRATROPIUM-ALBUTEROL 20-100 MCG/ACT IN AERS
1.0000 | INHALATION_SPRAY | Freq: Four times a day (QID) | RESPIRATORY_TRACT | Status: DC
  Administered 2020-01-05 – 2020-01-10 (×18): 1 via RESPIRATORY_TRACT
  Filled 2020-01-05 (×3): qty 4

## 2020-01-05 SURGICAL SUPPLY — 52 items
APL PRP STRL LF DISP 70% ISPRP (MISCELLANEOUS) ×1
CHLORAPREP W/TINT 26 (MISCELLANEOUS) ×3 IMPLANT
COVER MAYO STAND STRL (DRAPES) ×3 IMPLANT
COVER SURGICAL LIGHT HANDLE (MISCELLANEOUS) ×3 IMPLANT
COVER WAND RF STERILE (DRAPES) IMPLANT
DRAIN CHANNEL 19F RND (DRAIN) ×2 IMPLANT
DRAPE LAPAROSCOPIC ABDOMINAL (DRAPES) ×3 IMPLANT
DRAPE UTILITY XL STRL (DRAPES) ×1 IMPLANT
DRAPE WARM FLUID 44X44 (DRAPES) ×3 IMPLANT
DRSG OPSITE POSTOP 4X10 (GAUZE/BANDAGES/DRESSINGS) IMPLANT
DRSG OPSITE POSTOP 4X6 (GAUZE/BANDAGES/DRESSINGS) IMPLANT
DRSG OPSITE POSTOP 4X8 (GAUZE/BANDAGES/DRESSINGS) IMPLANT
DRSG PAD ABDOMINAL 8X10 ST (GAUZE/BANDAGES/DRESSINGS) ×2 IMPLANT
ELECT REM PT RETURN 15FT ADLT (MISCELLANEOUS) ×3 IMPLANT
EVACUATOR SILICONE 100CC (DRAIN) ×2 IMPLANT
GAUZE SPONGE 4X4 12PLY STRL (GAUZE/BANDAGES/DRESSINGS) ×2 IMPLANT
GLOVE BIOGEL PI IND STRL 7.0 (GLOVE) ×1 IMPLANT
GLOVE BIOGEL PI INDICATOR 7.0 (GLOVE) ×2
GLOVE SURG SS PI 7.0 STRL IVOR (GLOVE) ×3 IMPLANT
GOWN STRL REUS W/TWL LRG LVL3 (GOWN DISPOSABLE) ×3 IMPLANT
GOWN STRL REUS W/TWL XL LVL3 (GOWN DISPOSABLE) ×3 IMPLANT
HANDLE SUCTION POOLE (INSTRUMENTS) ×1 IMPLANT
KIT BASIN OR (CUSTOM PROCEDURE TRAY) ×3 IMPLANT
KIT TURNOVER KIT A (KITS) IMPLANT
PACK GENERAL/GYN (CUSTOM PROCEDURE TRAY) ×3 IMPLANT
PENCIL SMOKE EVACUATOR (MISCELLANEOUS) IMPLANT
RELOAD PROXIMATE 100 BLUE (MISCELLANEOUS) IMPLANT
RELOAD PROXIMATE 100MM BLUE (MISCELLANEOUS)
RELOAD STAPLE 100 3.8 BLU REG (MISCELLANEOUS) IMPLANT
SPONGE DRAIN TRACH 4X4 STRL 2S (GAUZE/BANDAGES/DRESSINGS) ×2 IMPLANT
SPONGE LAP 18X18 RF (DISPOSABLE) IMPLANT
STAPLER CUT CVD 40MM BLUE (STAPLE) ×2 IMPLANT
STAPLER CUT RELOAD BLUE (STAPLE) ×2 IMPLANT
STAPLER PROXIMATE 100MM BLUE (MISCELLANEOUS) IMPLANT
STAPLER VISISTAT 35W (STAPLE) ×1 IMPLANT
SUCTION POOLE HANDLE (INSTRUMENTS) ×3
SUT ETHILON 2 0 PS N (SUTURE) ×2 IMPLANT
SUT MNCRL AB 4-0 PS2 18 (SUTURE) IMPLANT
SUT PDS AB 0 CT1 36 (SUTURE) IMPLANT
SUT PROLENE 2 0 SH DA (SUTURE) ×2 IMPLANT
SUT SILK 2 0 (SUTURE) ×3
SUT SILK 2 0 SH CR/8 (SUTURE) ×3 IMPLANT
SUT SILK 2-0 18XBRD TIE 12 (SUTURE) ×1 IMPLANT
SUT SILK 3 0 (SUTURE) ×3
SUT SILK 3 0 SH CR/8 (SUTURE) ×3 IMPLANT
SUT SILK 3-0 18XBRD TIE 12 (SUTURE) ×1 IMPLANT
TAPE CLOTH SURG 6X10 WHT LF (GAUZE/BANDAGES/DRESSINGS) ×2 IMPLANT
TOWEL OR 17X26 10 PK STRL BLUE (TOWEL DISPOSABLE) ×3 IMPLANT
TOWEL OR NON WOVEN STRL DISP B (DISPOSABLE) ×3 IMPLANT
TRAY FOLEY MTR SLVR 14FR STAT (SET/KITS/TRAYS/PACK) ×1 IMPLANT
TRAY FOLEY MTR SLVR 16FR STAT (SET/KITS/TRAYS/PACK) ×1 IMPLANT
YANKAUER SUCT BULB TIP NO VENT (SUCTIONS) ×3 IMPLANT

## 2020-01-05 NOTE — Anesthesia Procedure Notes (Signed)
Procedure Name: Intubation Date/Time: 01/05/2020 3:54 PM Performed by: Sharlette Dense, CRNA Patient Re-evaluated:Patient Re-evaluated prior to induction Oxygen Delivery Method: Circle system utilized Preoxygenation: Pre-oxygenation with 100% oxygen Induction Type: IV induction, Rapid sequence and Cricoid Pressure applied Laryngoscope Size: Miller and 3 Grade View: Grade I Tube type: Oral Tube size: 8.0 mm Number of attempts: 1 Airway Equipment and Method: Stylet Placement Confirmation: ETT inserted through vocal cords under direct vision,  positive ETCO2 and breath sounds checked- equal and bilateral Secured at: 22 cm Tube secured with: Tape Dental Injury: Teeth and Oropharynx as per pre-operative assessment

## 2020-01-05 NOTE — Consult Note (Signed)
Reason for Consult: abdominal pain Referring Physician: Estill Cotta, M.D.  Anthony Curry. is an 79 y.o. male.  HPI: 79 yo male with 4 days of abdominal pain. He presented to the ED 3 days ago and was diagnosed with simple diverticulitis, put on IV zosyn and admitted to the COVID floor due to positive test. He was tolerating liquids yesterday. This morning he had more abdominal pain and underwent repeat CT scan showing worsening diverticulitis with moderate amount of free air. He also desaturated and was placed on oxygen therapy. Today he has been having multiple loose bowel movements. He also received eliquis today for his history of atrial fibrillation.  Past Medical History:  Diagnosis Date  . 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 Adrenal mass ([place])   . Alcoholism in remission ([place])   . Anemia of chronic disease   . 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 Anxiety   . BPH (benign prostatic hyperplasia)   . Cancer ([place])    small cell/lung  . Complication of anesthesia 09/08/2015    JITTERY AFTER GALLBLADDER SURGERY  . GERD (gastroesophageal reflux disease)   . History of radiation therapy 11/14/16, 11/18/16, 11/22/16   Left upper lung: 54 Gy in 3 fractions  . Hypertension   . Neoplastic malignant related fatigue    IV tx. every 2 weeks last9-14-16 (Dr. Earlie Server), radiation last tx. 6 months  . 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 Pericardial effusion   . Persistent atrial fibrillation ([place])   . Pulmonary nodules   . Radiation 12/02/14-12/16/14   Left adrenal gland 30 Gy in 10 fractions    Past Surgical History:  Procedure Laterality Date  . ANKLE ARTHRODESIS  1995   rt fx-hardware in  . CARDIOVERSION N/A 09/29/2014   Procedure: CARDIOVERSION;  Surgeon: Josue Hector, MD;  Location: [facility] ENDOSCOPY;  Service: Cardiovascular;  Laterality: N/A;  . CHOLECYSTECTOMY  09/08/2015    laproscopic   . CHOLECYSTECTOMY N/A 09/08/2015   Procedure: LAPAROSCOPIC  CHOLECYSTECTOMY WITH ATTEMPTED INTRAOPERATIVE CHOLANGIOGRAM ;  Surgeon: Fanny Skates, MD;  Location: [place];  Service: General;  Laterality: N/A;  . COLONOSCOPY    . ERCP N/A 09/02/2015   Procedure: ENDOSCOPIC RETROGRADE CHOLANGIOPANCREATOGRAPHY (ERCP);  Surgeon: Arta Silence, MD;  Location: [facility] ENDOSCOPY;  Service: Endoscopy;  Laterality: N/A;  . ERCP N/A 09/07/2015   Procedure: ENDOSCOPIC RETROGRADE CHOLANGIOPANCREATOGRAPHY (ERCP);  Surgeon: Clarene Essex, MD;  Location: [facility] ENDOSCOPY;  Service: Endoscopy;  Laterality: N/A;  . EXCISION / CURETTAGE BONE CYST FINGER  2005   rt thumb  . HEMORRHOID SURGERY    . Percutaneous Cholecytostomy tube     IVR insertion 06-13-15(Dr. Vernard Gambles)- 08-28-15 remains in place to drainage bag  . TONSILLECTOMY    . TOTAL SHOULDER ARTHROPLASTY  02/03/2012   Procedure: TOTAL SHOULDER ARTHROPLASTY;  Surgeon: [name], MD;  Location: [facility];  Service: Orthopedics;  Laterality: Left;    Family History  Problem Relation Age of Onset  . Lung cancer Mother   . Hypertension Father   . Heart attack Father   . Diabetes Paternal Grandmother   . Brain cancer Brother   . Heart attack Brother   . Neuropathy Neg Hx     Social History:  reports that he quit smoking about 29 years ago. His smoking use included cigarettes. He has a 60.00 pack-year smoking history. He has never used smokeless tobacco. He reports current alcohol use. He reports that he does not use drugs.  Allergies:  No Known Allergies  Medications: I have reviewed the patient's current medications.  Results for orders placed or performed during the hospital encounter of 01/02/20 (from the past 48 hour(s))  Culture, blood (routine x 2)     Status: None (Preliminary result)   Collection Time: 01/03/20 10:10 PM   Specimen: BLOOD  Result Value Ref Range   Specimen Description      BLOOD RIGHT ANTECUBITAL Performed at [facility], [place] [address] [place], [place] [address]     Special Requests      BOTTLES DRAWN AEROBIC AND ANAEROBIC Blood Culture adequate volume Performed at [facility] [address] [place], [place] [address]    Culture      NO GROWTH < 24 HOURS Performed at [facility] [address], [place], [place] [address]    Report Status PENDING   Culture, blood (routine x 2)     Status: None (Preliminary result)   Collection Time: 01/03/20 10:10 PM   Specimen: BLOOD  Result Value Ref Range   Specimen Description      BLOOD LEFT HAND Performed at [facility] [address], [place], [place] [address]    Special Requests      BOTTLES DRAWN AEROBIC AND ANAEROBIC Blood Culture adequate volume Performed at [facility] [address], [place], [place] [address]    Culture      NO GROWTH < 24 HOURS Performed at [facility] [address] [place], [place] [address]    Report Status PENDING   D-dimer, quantitative (not at [facility])     Status: Abnormal   Collection Time: 01/04/20  2:25 AM  Result Value Ref Range   D-Dimer, Quant 2.37 (H) 0.00 - 0.50 ug/mL-FEU    Comment: (NOTE) At the manufacturer cut-off of 0.50 ug/mL FEU, this assay has been documented to exclude PE with a sensitivity and negative predictive value of 97 to 99%.  At this time, this assay has not been approved by the FDA to exclude DVT/VTE. Results should be correlated with clinical presentation. Performed at [facility], [place] [address] [place], [place] [address]   C-reactive protein     Status: Abnormal   Collection Time: 01/04/20  2:25 AM  Result Value Ref Range   CRP 17.9 (H) <1.0 mg/dL    Comment: Performed at [facility], [place] [address], [place], [place] [address]  Ferritin     Status: None   Collection Time: 01/04/20  2:25 AM  Result Value Ref Range   Ferritin 167 24 - 336 ng/mL    Comment: Performed at [facility], [place]  [address] [place], [place] [address]  CBC     Status: Abnormal   Collection Time: 01/04/20  2:25 AM  Result Value Ref Range   WBC 12.5 (H) 4.0 - 10.5 K/uL   RBC 4.62 4.22 - 5.81 MIL/uL   Hemoglobin 14.0 13.0 - 17.0 g/dL   HCT 41.5 39.0 - 52.0 %   MCV 89.8 80.0 - 100.0 fL   MCH 30.3 26.0 - 34.0 pg   MCHC 33.7 30.0 - 36.0 g/dL   RDW 14.3 11.5 - 15.5 %   Platelets 238 150 - 400 K/uL   nRBC 0.0 0.0 - 0.2 %    Comment: Performed at [facility], [place] [address] [place], [place] [address]  Comprehensive metabolic panel     Status: Abnormal  Collection Time: 01/04/20  2:25 AM  Result Value Ref Range   Sodium 131 (L) 135 - 145 mmol/L   Potassium 3.7 3.5 - 5.1 mmol/L   Chloride 100 98 - 111 mmol/L   CO2 21 (L) 22 - 32 mmol/L   Glucose, Bld 107 (H) 70 - 99 mg/dL   BUN 20 8 - 23 mg/dL   Creatinine, Ser 0.81 0.61 - 1.24 mg/dL   Calcium 8.3 (L) 8.9 - 10.3 mg/dL   Total Protein 5.6 (L) 6.5 - 8.1 g/dL   Albumin 3.0 (L) 3.5 - 5.0 g/dL   AST 25 15 - 41 U/L   ALT 15 0 - 44 U/L   Alkaline Phosphatase 63 38 - 126 U/L   Total Bilirubin 1.3 (H) 0.3 - 1.2 mg/dL   GFR calc non Af Amer >60 >60 mL/min   GFR calc Af Amer >60 >60 mL/min   Anion gap 10 5 - 15    Comment: Performed at [facility], [place] [address], [place], [place] [address]  D-dimer, quantitative (not at [facility])     Status: Abnormal   Collection Time: 01/05/20  3:40 AM  Result Value Ref Range   D-Dimer, Quant 4.48 (H) 0.00 - 0.50 ug/mL-FEU    Comment: (NOTE) At the manufacturer cut-off of 0.50 ug/mL FEU, this assay has been documented to exclude PE with a sensitivity and negative predictive value of 97 to 99%.  At this time, this assay has not been approved by the FDA to exclude DVT/VTE. Results should be correlated with clinical presentation. Performed at [facility], [place] [address], [name], [place] [address]   C-reactive protein     Status: Abnormal   Collection Time:  01/05/20  3:40 AM  Result Value Ref Range   CRP 22.8 (H) <1.0 mg/dL    Comment: Performed at [facility], [place] [address], [place], [place] [address]  Ferritin     Status: None   Collection Time: 01/05/20  3:40 AM  Result Value Ref Range   Ferritin 194 24 - 336 ng/mL    Comment: Performed at [facility], [place] [address] [place], [place] [address]  CBC     Status: Abnormal   Collection Time: 01/05/20  3:40 AM  Result Value Ref Range   WBC 12.8 (H) 4.0 - 10.5 K/uL   RBC 4.34 4.22 - 5.81 MIL/uL   Hemoglobin 13.4 13.0 - 17.0 g/dL   HCT 39.2 39.0 - 52.0 %   MCV 90.3 80.0 - 100.0 fL   MCH 30.9 26.0 - 34.0 pg   MCHC 34.2 30.0 - 36.0 g/dL   RDW 14.4 11.5 - 15.5 %   Platelets 242 150 - 400 K/uL   nRBC 0.0 0.0 - 0.2 %    Comment: Performed at [facility], [place] [address], [place], [place] [address]  Comprehensive metabolic panel     Status: Abnormal   Collection Time: 01/05/20  3:40 AM  Result Value Ref Range   Sodium 130 (L) 135 - 145 mmol/L   Potassium 3.3 (L) 3.5 - 5.1 mmol/L   Chloride 101 98 - 111 mmol/L   CO2 21 (L) 22 - 32 mmol/L   Glucose, Bld 83 70 - 99 mg/dL   BUN 18 8 - 23 mg/dL   Creatinine, Ser 0.72 0.61 - 1.24 mg/dL   Calcium 7.7 (L) 8.9 - 10.3 mg/dL   Total Protein 4.9 (L) 6.5 - 8.1 g/dL  Albumin 2.3 (L) 3.5 - 5.0 g/dL   AST 21 15 - 41 U/L   ALT 16 0 - 44 U/L   Alkaline Phosphatase 64 38 - 126 U/L   Total Bilirubin 1.0 0.3 - 1.2 mg/dL   GFR calc non Af Amer >60 >60 mL/min   GFR calc Af Amer >60 >60 mL/min   Anion gap 8 5 - 15    Comment: Performed at [facility], [place] [address] [place], [place] [address]  Brain natriuretic peptide     Status: Abnormal   Collection Time: 01/05/20 11:19 AM  Result Value Ref Range   B Natriuretic Peptide 109.6 (H) 0.0 - 100.0 pg/mL    Comment: Performed at [facility], [place] [address], [place], [place] [address]   *Note: Due to a  large number of results and/or encounters for the requested time period, some results have not been displayed. A complete set of results can be found in Results Review.    DG Chest 2 View  Result Date: 01/05/2020 CLINICAL DATA:  Patient with stage IV lung cancer. Left lower quadrant abdominal pain. Diarrhea. EXAM: CHEST - 2 VIEW COMPARISON:  CT scan from January 05, 2020 at 11:39 a.m. FINDINGS: Free air seen under the diaphragm, also appreciated on the CT scan from earlier today. Healed right rib fractures. Mild infiltrate in the medial right lung base, more prominent since January 02, 2020. Pleural thickening versus small left pleural effusion. Atelectasis or scar in the left base. The heart, hila, mediastinum, lungs, and pleura are otherwise unchanged. IMPRESSION: 1. Free air under the diaphragm. This finding was seen on the CT scan from earlier today and called to Dr. Tana Coast by Dr. Jobe Igo at 12:30 p.m. 2. Mild opacity in the right base is likely atelectasis based on CT imaging from earlier today. 3. No other changes. Electronically Signed   By: Dorise Bullion III M.D   On: 01/05/2020 12:43   CT ABDOMEN PELVIS W CONTRAST  Result Date: 01/05/2020 CLINICAL DATA:  Nausea and vomiting. Acute diverticulitis. Worsening pain. Fevers. EXAM: CT ABDOMEN AND PELVIS WITH CONTRAST TECHNIQUE: Multidetector CT imaging of the abdomen and pelvis was performed using the standard protocol following bolus administration of intravenous contrast. CONTRAST:  175mL OMNIPAQUE IOHEXOL 300 MG/ML  SOLN COMPARISON:  01/02/2020 FINDINGS: Lower chest: Bibasilar atelectasis. Normal heart size with small chronic pericardial effusion. Small bilateral pleural effusions, new. Multivessel coronary artery atherosclerosis. Hepatobiliary: Scattered well-circumscribed low-density liver lesions are likely cysts. Normal gallbladder, without biliary ductal dilatation. Pancreas: Normal, without mass or ductal dilatation. Spleen: Normal in size,  without focal abnormality. Adrenals/Urinary Tract: Normal left adrenal gland. Mild right adrenal thickening. Right renal cortical thinning. No hydronephrosis. Decompressed urinary bladder around a Foley catheter. Stomach/Bowel: Normal stomach, without wall thickening. Scattered colonic diverticula. Increased wall thickening involving the proximal sigmoid, including on 67/2. Mid small bowel loops measure up to 2.4 cm. Pelvic small bowel loops are mildly thickened, including on 73/2. Vascular/Lymphatic: Advanced aortic and branch vessel atherosclerosis. No abdominopelvic adenopathy. Reproductive: Normal prostate. Other: New small volume cul-de-sac fluid, including on 71/2. Extensive, moderate volume free intraperitoneal air, including within the right upper quadrant on 27/2 and adjacent the right lobe of the liver on 29/2. Multiple small locules of pelvic free air, including on 61/2. Enhancing small volume fluid within the anterior left pelvis on 66/2 and up to 3.8 x 1.5 cm just inferior to this on 71/2. Musculoskeletal: Bilateral remote rib fractures. Lumbosacral spondylosis. IMPRESSION: 1. Moderate  free intraperitoneal air, likely due to complicated sigmoid diverticulitis. Small volume pericolonic fluid and enhancement, suspicious for infected ascites. 2. Borderline small bowel dilatation, suggesting adynamic ileus. Small bowel wall thickening within the pelvis is likely due to secondary enteritis. 3. New small bilateral pleural effusions with similar pericardial effusion. 4. Coronary artery atherosclerosis. Aortic Atherosclerosis (ICD10-I70.0). Critical test results telephoned to Dr. Tana Coast. At the time of interpretation at 12:30 p.m.On 01/05/2020. Electronically Signed   By: Abigail Miyamoto M.D.   On: 01/05/2020 12:32   Review of Systems  Constitutional: Positive for fever. Negative for chills.  Gastrointestinal: Positive for abdominal pain and diarrhea. Negative for nausea and vomiting.  All other systems reviewed  and are negative.  Blood pressure (!) 157/62, pulse 80, temperature 98.2 F (36.8 C), temperature source Oral, resp. rate (!) 26, height 5\' 11"  (1.803 m), weight 65.8 kg, SpO2 97 %. Physical Exam  Vitals reviewed. Constitutional: He is oriented to person, place, and time. He appears well-developed and well-nourished.  HENT:  Head: Normocephalic and atraumatic.  Eyes: Pupils are equal, round, and reactive to light. Conjunctivae and EOM are normal.  Cardiovascular: Normal rate and regular rhythm.  Respiratory: Effort normal and breath sounds normal.  GI: Soft. Bowel sounds are normal. He exhibits no distension. There is abdominal tenderness in the left lower quadrant. There is guarding.  Musculoskeletal:        General: Normal range of motion.     Cervical back: Normal range of motion and neck supple.  Neurological: He is alert and oriented to person, place, and time.  Skin: Skin is warm and dry.  Psychiatric: He has a normal mood and affect. His behavior is normal.   Assessment/Plan: 79 yo male with diverticulitis that appears to have worsened in the last 2 days by CT scan. He is very tender over left lower quadrant. Surprisingly he is able to ambulate without pain and has no pain while sitting and vitals are currently normal. The situation is difficult due to recent blood thinner, COVID +, stage IV lung cancer. Due to immunocompromised nature and worsening CT despite maximum medical therapy, will proceed with exploration and colon resection with likely colostomy to gain source control and minimize further complications in this complicated situation.  Arta Bruce Jahni Nazar 01/05/2020, 1:55 PM

## 2020-01-05 NOTE — Transfer of Care (Signed)
Immediate Anesthesia Transfer of Care Note  Patient: Anthony Curry.  Procedure(s) Performed: EXPLORATORY LAPAROTOMY sigmoid colon resection creation end colostomy for perforated diverticulitis and purulent peritonitis (N/A Abdomen)  Patient Location: PACU  Anesthesia Type:General  Level of Consciousness: awake and alert   Airway & Oxygen Therapy: Patient Spontanous Breathing and Patient connected to face mask oxygen  Post-op Assessment: Report given to RN and Post -op Vital signs reviewed and stable  Post vital signs: Reviewed and stable  Last Vitals:  Vitals Value Taken Time  BP 145/59 01/05/20 1747  Temp 37 C 01/05/20 1801  Pulse 90 01/05/20 1801  Resp 16 01/05/20 1745  SpO2 98 % 01/05/20 1801  Vitals shown include unvalidated device data.  Last Pain:  Vitals:   01/05/20 1745  TempSrc:   PainSc: Asleep      Patients Stated Pain Goal: 3 (17/61/60 7371)  Complications: No apparent anesthesia complications

## 2020-01-05 NOTE — Progress Notes (Addendum)
[facility]                                                                              Patient Demographics  Anthony Curry, is a 79 y.o. male, DOB - Jan 25, 1941, HUT:[id number]  Admit date - 01/02/2020   Admitting Physician Anthony Coss, MD  Outpatient Primary MD for the patient is Anthony Dus, MD  Outpatient specialists:   LOS - 3  days   Medical records reviewed and are as summarized below:    Chief Complaint  Patient presents with  . Abdominal Pain       Brief summary   Patient is a 79 year old male with history of stage IV non-small cell lung CA, SIADH, paroxysmal A. fib, hypothyroidism, depression, anxiety presents from home with c/o LLQ abdominal pain x 1 day associated with diarrhea. Patient follows with Anthony Curry for right non-small cell lung cancer and is currently on chemotherapy. He lives with his wife and daughter. He is functional and ambulatory at baseline. His wife recently tested positive for Covid and was admitted at [facility]. His COVID screen test from [facility] resulted positive. CT abdomen showed acute diverticulitis at the junction of distal descending and sigmoid colon.  No abscess or perforation.  Assessment & Plan    Principal Problem: Acute diverticulitis - CT abdomen showed acute diverticulitis at the junction of distal descending and sigmoid colon, no abscess or perforation. - Spiking fevers, still has abdominal pain, repeat CT abdomen today - cont IV zosyn ADDENDUM: 12:35pm  Received a call from the radiology, Anthony Curry, patient has perforation with moderate free intraperitoneal air likely due to complicated sigmoid diverticulitis, small volume pericolonic fluid and enhancement suspicious for infected ascites, adynamic ileus, secondary enteritis. -Made strict n.p.o., discontinued apixaban -Continue IV Zosyn, discussed with surgery, Anthony Curry, will evaluate patient for further management -Transfer patient to stepdown  unit -Called patient's daughter again and updated.  Active Problems:  Acute hypoxic respiratory failure due to acute COVID-19 viral pneumonia during the ongoing COVID-19 pandemic- POA - At the time of my examination, somewhat congested, coughing, wheezing, overnight spiking fevers - Currently hypoxic, was 80% on room air placed on 2L nasal cannula -Start remdesivir per pharmacy protocol, day #1 today.   - CRP trending up, 22.8, Actemra not given due to ongoing acute diverticulitis, on immunotherapy for lung CA.  Hold off on IV steroids for now due to perforation. - will give convalescent plasma, discussed with daughter, who agrees with management and plan. - Hold IV fluids, check BNP - D-dimer is trending up however patient is on full dose apixaban, now on hold  - Continue Supportive care: vitamin C/zinc, albuterol, Tylenol. - Continue to wean oxygen, ambulatory O2 screening daily as tolerated  - Oxygen - SpO2: 97 % O2 Flow Rate (L/min): 2 L/min - Continue to follow labs as below  Lab Results  Component Value Date   SARSCOV2NAA POSITIVE (A) 01/02/2020   SARSCOV2NAA Detected (A) 01/01/2020   SARSCOV2NAA Not Detected 12/27/2019   [place] Not Detected 11/25/2019     Recent Labs  Lab 01/02/20 1041 01/02/20 1826 01/03/20 0330 01/04/20 0225 01/05/20 0340  DDIMER  --  0.99* 0.87* 2.37* 4.48*  FERRITIN  --  132 149 167 194  CRP  --  13.1* 18.2* 17.9* 22.8*  ALT 21  --  18 15 16        Hyponatremia - Has fluctuating sodium levels 128-135, likely associated with SIADH related to lung CA - cont demeclocycline   History of stage IV non-small cell lung CA -Patient was scheduled for a chemo infusion day prior to admission, but was canceled due to Covid -Oncology has been informed  Paroxysmal atrial fibrillation (HCC)  -Currently normal sinus rhythm, continue Eliquis -Continue Cardizem for rate control  Pericardial effusion -CT scan showed mild to moderate pericardial  effusion, possibly associated with his malignancy -Echo read as grade 1 diastolic dysfunction with preserved EF, large pericardial effusion anterior to right ventricle with the motion of the right ventricle wall -Asymptomatic, cardiology was consulted, seen by Anthony Curry, moderate effusion possibly due to malignancy, continue anticoagulation, no acute interventions recommended  BPH/urinary retention -Continue tamsulosin, Foley catheter placed  Hypothyroidism Continue Synthroid  Mild protein calorie malnutrition Continue protein supplements  Code Status: Full DVT Prophylaxis: Apixaban placed on hold, will place on SCDs Family Communication: Discussed all imaging results, lab results, explained to the patient and discussed management and plan with patient's daughter on the phone   Disposition Plan: Currently hypoxic, spiking fevers, acute respiratory failure with Covid illness and acute diverticulitis. Patient from home, likely will DC home when medically stable and improving.  Time Spent in minutes 25 minutes  Procedures:  None  Consultants:   Cardiology  Antimicrobials:   Anti-infectives (From admission, onward)   Start     Dose/Rate Route Frequency Ordered Stop   01/06/20 1000  remdesivir 100 mg in sodium chloride 0.9 % 100 mL IVPB     100 mg 200 mL/hr over 30 Minutes Intravenous Daily 01/05/20 0846 01/10/20 0959   01/05/20 1000  remdesivir 200 mg in sodium chloride 0.9% 250 mL IVPB     200 mg 580 mL/hr over 30 Minutes Intravenous Once 01/05/20 0846 01/05/20 1002   01/02/20 2000  piperacillin-tazobactam (ZOSYN) IVPB 3.375 g     3.375 g 12.5 mL/hr over 240 Minutes Intravenous Every 8 hours 01/02/20 1415     01/02/20 1500  demeclocycline (DECLOMYCIN) tablet 150 mg    Note to Pharmacy: Take 2 tablets twice a day     150 mg Oral 2 times daily 01/02/20 1408     01/02/20 1345  piperacillin-tazobactam (ZOSYN) IVPB 3.375 g     3.375 g 100 mL/hr over 30 Minutes Intravenous   Once 01/02/20 1331 01/02/20 1528         Medications  Scheduled Meds: . apixaban  5 mg Oral BID  . vitamin C  500 mg Oral BID  . chlorhexidine  15 mL Mouth Rinse BID  . Chlorhexidine Gluconate Cloth  6 each Topical Daily  . demeclocycline  150 mg Oral BID  . diltiazem  180 mg Oral Q1200  . fluticasone  2 spray Each Nare QHS  . iohexol      . Ipratropium-Albuterol  1 puff Inhalation Q6H  . levothyroxine  50 mcg Oral QAC breakfast  . mouth rinse  15 mL Mouth Rinse q12n4p  . methylPREDNISolone (SOLU-MEDROL) injection  40 mg Intravenous Q12H  . mirtazapine  30 mg Oral QHS  . mometasone-formoterol  2 puff Inhalation BID  . tamsulosin  0.4 mg Oral BID  . thiamine  100 mg Oral Daily  . zinc sulfate  220  mg Oral Daily   Continuous Infusions: . piperacillin-tazobactam (ZOSYN)  IV 3.375 g (01/05/20 0358)  . [START ON 01/06/2020] remdesivir 100 mg in NS 100 mL     PRN Meds:.acetaminophen, albuterol, ALPRAZolam, hydrALAZINE, HYDROcodone-acetaminophen, HYDROmorphone (DILAUDID) injection, ondansetron (ZOFRAN) IV      Subjective:   Anthony Curry was seen and examined today.  Coughing and congested, still has abdominal pain in the left lower quadrant.  Overnight spiking fevers.  No nausea or vomiting. Objective:   Vitals:   01/05/20 0900 01/05/20 1042 01/05/20 1100 01/05/20 1105  BP:  (!) 157/62    Pulse:  80    Resp: (!) 31 18    Temp:  98.2 F (36.8 C)    TempSrc:  Oral    SpO2:  96% (!) 80% 97%  Weight:      Height:        Intake/Output Summary (Last 24 hours) at 01/05/2020 1125 Last data filed at 01/05/2020 1017 Gross per 24 hour  Intake 780 ml  Output 800 ml  Net -20 ml     Wt Readings from Last 3 Encounters:  01/02/20 65.8 kg  12/02/19 66.6 kg  10/29/19 65 kg   Physical Exam  General: Alert and oriented x 3, coughing, wheezing  Eyes:   HEENT:  Atraumatic, normocephalic  Cardiovascular: S1 S2 clear, no murmurs, RRR. No pedal edema b/l  Respiratory:  Bilateral expiratory wheezing  Gastrointestinal: Soft, TTP in LLQ nondistended, NBS  Ext: no pedal edema bilaterally  Neuro: no new deficits  Musculoskeletal: No cyanosis, clubbing  Skin: No rashes  Psych: Normal affect and demeanor, alert and oriented x3    Data Reviewed:  I have personally reviewed following labs and imaging studies  Micro Results Recent Results (from the past 240 hour(s))  Novel Coronavirus, NAA ([facility])     Status: None   Collection Time: 12/27/19  1:41 PM   Specimen: Nasopharyngeal(NP) swabs in vial transport medium   NASOPHARYNGE  TESTING  Result Value Ref Range Status   SARS-CoV-2, NAA Not Detected Not Detected Final    Comment: This nucleic acid amplification test was developed and its performance characteristics determined by [employer]. Nucleic acid amplification tests include RT-PCR and TMA. This test has not been FDA cleared or approved. This test has been authorized by FDA under an Emergency Use Authorization (EUA). This test is only authorized for the duration of time the declaration that circumstances exist justifying the authorization of the emergency use of in vitro diagnostic tests for detection of SARS-CoV-2 virus and/or diagnosis of COVID-19 infection under section 564(b)(1) of the Act, 21 U.S.C. 629BMW-4(X) (1), unless the authorization is terminated or revoked sooner. When diagnostic testing is negative, the possibility of a false negative result should be considered in the context of a patient's recent exposures and the presence of clinical signs and symptoms consistent with COVID-19. An individual without symptoms of COVID-19 and who is not shedding SARS-CoV-2 virus wo uld expect to have a negative (not detected) result in this assay.   Novel Coronavirus, NAA ([facility])     Status: Abnormal   Collection Time: 01/01/20  2:24 PM   Specimen: Nasopharyngeal(NP) swabs in vial transport medium   NASOPHARYNGE  TESTING  Result  Value Ref Range Status   SARS-CoV-2, NAA Detected (A) Not Detected Final    Comment: This nucleic acid amplification test was developed and its performance characteristics determined by [employer]. Nucleic acid amplification tests include RT-PCR and TMA. This test has not been  FDA cleared or approved. This test has been authorized by FDA under an Emergency Use Authorization (EUA). This test is only authorized for the duration of time the declaration that circumstances exist justifying the authorization of the emergency use of in vitro diagnostic tests for detection of SARS-CoV-2 virus and/or diagnosis of COVID-19 infection under section 564(b)(1) of the Act, 21 U.S.C. 825KNL-9(J) (1), unless the authorization is terminated or revoked sooner. When diagnostic testing is negative, the possibility of a false negative result should be considered in the context of a patient's recent exposures and the presence of clinical signs and symptoms consistent with COVID-19. An individual without symptoms of COVID-19 and who is not shedding SARS-CoV-2 virus wo uld expect to have a negative (not detected) result in this assay.   SARS CORONAVIRUS 2 (TAT 6-24 HRS) Nasopharyngeal Nasopharyngeal Swab     Status: Abnormal   Collection Time: 01/02/20  2:29 PM   Specimen: Nasopharyngeal Swab  Result Value Ref Range Status   SARS Coronavirus 2 POSITIVE (A) NEGATIVE Final    Comment: RESULT CALLED TO, READ BACK BY AND VERIFIED WITH: T BAKER,RN 2015 01/02/2020 D BRADLEY (NOTE) SARS-CoV-2 target nucleic acids are DETECTED. The SARS-CoV-2 RNA is generally detectable in upper and lower respiratory specimens during the acute phase of infection. Positive results are indicative of the presence of SARS-CoV-2 RNA. Clinical correlation with patient history and other diagnostic information is  necessary to determine patient infection status. Positive results do not rule out bacterial infection or co-infection  with other viruses.  The expected result is Negative. Fact Sheet for Patients: SugarRoll.be Fact Sheet for Healthcare Providers: https://www.woods-mathews.com/ This test is not yet approved or cleared by the [place] FDA and  has been authorized for detection and/or diagnosis of SARS-CoV-2 by FDA under an Emergency Use Authorization (EUA). This EUA will remain  in effect (meaning this test can be used) for the  duration of the COVID-19 declaration under Section 564(b)(1) of the Act, 21 U.S.C. section 360bbb-3(b)(1), unless the authorization is terminated or revoked sooner. Performed at [facility], [place] [address], [place], [place] [address]   Culture, blood (routine x 2)     Status: None (Preliminary result)   Collection Time: 01/03/20 10:10 PM   Specimen: BLOOD  Result Value Ref Range Status   Specimen Description   Final    BLOOD RIGHT ANTECUBITAL Performed at [facility], [place] [address] [place], [place] [address]    Special Requests   Final    BOTTLES DRAWN AEROBIC AND ANAEROBIC Blood Culture adequate volume Performed at [place] [address], [place], [place] [address]    Culture   Final    NO GROWTH < 24 HOURS Performed at [facility] [place] [address], [place], [place] [address]    Report Status PENDING  Incomplete  Culture, blood (routine x 2)     Status: None (Preliminary result)   Collection Time: 01/03/20 10:10 PM   Specimen: BLOOD  Result Value Ref Range Status   Specimen Description   Final    BLOOD LEFT HAND Performed at [facility] [address], [place], [place] [address]    Special Requests   Final    BOTTLES DRAWN AEROBIC AND ANAEROBIC Blood Culture adequate volume Performed at [place] [address], [place],  [address]    Culture   Final    NO GROWTH < 24 HOURS Performed at [facility], [address], [place], [place] [address]    Report Status PENDING  Incomplete    Radiology Reports CT ABDOMEN PELVIS W CONTRAST  Result Date: 01/02/2020 CLINICAL DATA:  Lower abdominal pain. The patient is on immunotherapy for lung carcinoma. EXAM: CT ABDOMEN AND PELVIS WITH CONTRAST TECHNIQUE: Multidetector CT imaging of the abdomen and pelvis was performed using the standard protocol following bolus administration of intravenous contrast. CONTRAST:  100 mL OMNIPAQUE IOHEXOL 300 MG/ML  SOLN COMPARISON:  CT chest, abdomen and pelvis 05/13/2019. FINDINGS: Lower chest: No pleural effusion. Small pericardial effusion is unchanged. Lung bases are emphysematous. There is mild dependent atelectasis. Hepatobiliary: A few small low attenuating lesions in the liver likely representing cysts are unchanged. Status post cholecystectomy. Biliary tree unremarkable. Pancreas: Unremarkable. No pancreatic ductal dilatation or surrounding inflammatory changes. Spleen: Normal in size without focal abnormality. 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 The left adrenal gland appears normal. There is some scarring in the right kidney which is unchanged. The kidneys otherwise appear normal. No stones or hydronephrosis. The urinary bladder is distended but otherwise unremarkable. Stomach/Bowel: Diverticulosis is identified. There is thickening and stranding near the junction of the descending and sigmoid consistent with acute diverticulitis. No abscess or perforation. The stomach and small bowel appear normal. The appendix is normal in appearance. Note is made that the cecum is positioned in the right upper quadrant. Vascular/Lymphatic: Aortic atherosclerosis. No enlarged abdominal or pelvic lymph nodes. Reproductive: Prostate is unremarkable. Other: None. Musculoskeletal: No acute or focal abnormality. Severe degenerative change throughout the visualized spine is noted. IMPRESSION: Diverticulosis with acute  diverticulitis the junction of the distal descending and sigmoid colon. No abscess or perforation. No change in a moderate pericardial effusion. Emphysema. Extensive atherosclerosis. Electronically Signed   By: Inge Rise M.D.   On: 01/02/2020 13:10   DG CHEST PORT 1 VIEW  Result Date: 01/02/2020 CLINICAL DATA:  79 year old male with stage IV lung cancer, SI ADH. Undergoing chemotherapy. Positive COVID-19 yesterday. EXAM: PORTABLE CHEST 1 VIEW COMPARISON:  CT Abdomen and Pelvis earlier today. Chest CT 09/02/2019 and earlier. FINDINGS: Portable AP semi upright view at 1741 hours. Stable lung volumes and mediastinal contours since 2020. Numerous chronic right rib fractures. Chronic reticular opacity in the right upper lobe and near the left hilum. Elsewhere when allowing for portable technique the lungs are clear. No pneumothorax, pulmonary edema or pleural effusion. Stable visualized osseous structures. IMPRESSION: Stable radiographic appearance of chronic lung disease since July 2020. No acute cardiopulmonary abnormality. Electronically Signed   By: Genevie Ann M.D.   On: 01/02/2020 18:38   ECHOCARDIOGRAM COMPLETE  Result Date: 01/02/2020   ECHOCARDIOGRAM REPORT   Patient Name:   Anthony Curry. Date of Exam: 01/02/2020 Medical Rec #:  [id number]           Height:       71.0 in Accession #:    [id number]          Weight:       145.0 lb Date of Birth:  Dec 28, 1940          BSA:          1.84 m Patient Age:    60 years            BP:           152/65 mmHg Patient Gender: M                   HR:  82 bpm. Exam Location:  Inpatient Procedure: 2D Echo, Cardiac Doppler and Color Doppler Indications:    Pericardial effusion 423.9  History:        Patient has prior history of Echocardiogram examinations, most                 recent 07/26/2014. Arrythmias:Atrial Fibrillation and Atrial                 Flutter; Risk Factors:Hypertension and Former Smoker. GERD.  Sonographer:    Paulita Fujita RDCS  Referring Phys: [id number] AMRIT ADHIKARI  Sonographer Comments: Image acquisition challenging due to patient body habitus. IMPRESSIONS  1. Left ventricular ejection fraction, by visual estimation, is 60 to 65%. The left ventricle has normal function. There is no left ventricular hypertrophy.  2. Left ventricular diastolic parameters are consistent with Grade I diastolic dysfunction (impaired relaxation).  3. Global right ventricle has normal systolic function.The right ventricular size is normal. No increase in right ventricular wall thickness.  4. Left atrial size was normal.  5. Right atrial size was normal.  6. Large pericardial effusion.  7. The pericardial effusion is anterior to the right ventricle.  8. There is inversion of the right ventricular wall and excessive respiratory variation in the tricuspid valve spectral Doppler velocities.  9. The mitral valve is normal in structure. Mild mitral valve regurgitation. No evidence of mitral stenosis. 10. The tricuspid valve is normal in structure. 11. The tricuspid valve is normal in structure. Tricuspid valve regurgitation is mild. 12. The aortic valve is normal in structure. Aortic valve regurgitation is not visualized. No evidence of aortic valve sclerosis or stenosis. 13. The pulmonic valve was normal in structure. Pulmonic valve regurgitation is not visualized. 14. Mildly elevated pulmonary artery systolic pressure. 15. The inferior vena cava is normal in size with greater than 50% respiratory variability, suggesting right atrial pressure of 3 mmHg. FINDINGS  Left Ventricle: Left ventricular ejection fraction, by visual estimation, is 60 to 65%. The left ventricle has normal function. The left ventricle is not well visualized. There is no left ventricular hypertrophy. Left ventricular diastolic parameters are consistent with Grade I diastolic dysfunction (impaired relaxation). Indeterminate filling pressures. Right Ventricle: The right ventricular size is  normal. No increase in right ventricular wall thickness. Global RV systolic function is has normal systolic function. The tricuspid regurgitant velocity is 2.46 m/s, and with an assumed right atrial pressure  of 8 mmHg, the estimated right ventricular systolic pressure is mildly elevated at 32.2 mmHg. Left Atrium: Left atrial size was normal in size. Right Atrium: Right atrial size was normal in size Pericardium: A large pericardial effusion is present. The pericardial effusion is anterior to the right ventricle. There is inversion of the right ventricular wall and excessive respiratory variation in the tricuspid valve spectral Doppler velocities. Pericardial effusion measuring up to 1.86 cm. Unable to visualize the IVC. Findings may be con. Mitral Valve: The mitral valve is normal in structure. Mild mitral valve regurgitation. No evidence of mitral valve stenosis by observation. Tricuspid Valve: The tricuspid valve is normal in structure. Tricuspid valve regurgitation is mild. Aortic Valve: The aortic valve is normal in structure. Aortic valve regurgitation is not visualized. The aortic valve is structurally normal, with no evidence of sclerosis or stenosis. Pulmonic Valve: The pulmonic valve was normal in structure. Pulmonic valve regurgitation is not visualized. Pulmonic regurgitation is not visualized. Aorta: The aortic root, ascending aorta and aortic arch are all structurally normal, with no evidence of dilitation  or obstruction. Venous: The inferior vena cava was not well visualized. The inferior vena cava is normal in size with greater than 50% respiratory variability, suggesting right atrial pressure of 3 mmHg. IAS/Shunts: No atrial level shunt detected by color flow Doppler. There is no evidence of a patent foramen ovale. No ventricular septal defect is seen or detected. There is no evidence of an atrial septal defect.   Diastology LV e' lateral:   5.87 cm/s LV E/e' lateral: 11.5 LV e' medial:    4.90 cm/s  LV E/e' medial:  13.7  RIGHT VENTRICLE RV S prime:     28.60 cm/s TAPSE (M-mode): 2.1 cm LEFT ATRIUM             Index       RIGHT ATRIUM           Index LA Vol (A2C):   30.0 ml 16.31 ml/m RA Area:     12.10 cm LA Vol (A4C):   21.2 ml 11.53 ml/m RA Volume:   30.10 ml  16.37 ml/m LA Biplane Vol: 25.3 ml 13.76 ml/m  AORTIC VALVE LVOT Vmax:   70.30 cm/s LVOT Vmean:  50.200 cm/s LVOT VTI:    0.137 m MITRAL VALVE                        TRICUSPID VALVE MV Area (PHT): 2.96 cm             TR Peak grad:   24.2 mmHg MV PHT:        74.24 msec           TR Vmax:        246.00 cm/s MV Decel Time: 256 msec MV E velocity: 67.30 cm/s 103 cm/s  SHUNTS MV A velocity: 65.10 cm/s 70.3 cm/s Systemic VTI: 0.14 m MV E/A ratio:  1.03       1.5  Skeet Latch MD Electronically signed by Skeet Latch MD Signature Date/Time: 01/02/2020/6:33:38 PM    Final     Lab Data:  CBC: Recent Labs  Lab 01/02/20 1041 01/03/20 0330 01/04/20 0225 01/05/20 0340  WBC 8.5 9.2 12.5* 12.8*  NEUTROABS 7.0  --   --   --   HGB 14.6 14.7 14.0 13.4  HCT 42.4 43.2 41.5 39.2  MCV 89.1 91.1 89.8 90.3  PLT 222 206 238 480   Basic Metabolic Panel: Recent Labs  Lab 01/02/20 1041 01/03/20 0330 01/04/20 0225 01/05/20 0340  NA 128* 131* 131* 130*  K 4.2 4.3 3.7 3.3*  CL 93* 98 100 101  CO2 26 23 21* 21*  GLUCOSE 127* 146* 107* 83  BUN 15 16 20 18   CREATININE 0.88 0.82 0.81 0.72  CALCIUM 9.0 8.7* 8.3* 7.7*   GFR: Estimated Creatinine Clearance: 68.5 mL/min (by C-G formula based on SCr of 0.72 mg/dL). Liver Function Tests: Recent Labs  Lab 01/02/20 1041 01/03/20 0330 01/04/20 0225 01/05/20 0340  AST 26 23 25 21   ALT 21 18 15 16   ALKPHOS 88 71 63 64  BILITOT 1.4* 0.9 1.3* 1.0  PROT 6.5 5.8* 5.6* 4.9*  ALBUMIN 3.7 3.3* 3.0* 2.3*   Recent Labs  Lab 01/02/20 1041  LIPASE 19   No results for input(s): AMMONIA in the last 168 hours. Coagulation Profile: No results for input(s): INR, PROTIME in the last 168  hours. Cardiac Enzymes: No results for input(s): CKTOTAL, CKMB, CKMBINDEX, TROPONINI in the last 168 hours. BNP (last 3 results) No results  for input(s): PROBNP in the last 8760 hours. HbA1C: No results for input(s): HGBA1C in the last 72 hours. CBG: No results for input(s): GLUCAP in the last 168 hours. Lipid Profile: No results for input(s): CHOL, HDL, LDLCALC, TRIG, CHOLHDL, LDLDIRECT in the last 72 hours. Thyroid Function Tests: No results for input(s): TSH, T4TOTAL, FREET4, T3FREE, THYROIDAB in the last 72 hours. Anemia Panel: Recent Labs    01/04/20 0225 01/05/20 0340  FERRITIN 167 194   Urine analysis:    Component Value Date/Time   COLORURINE YELLOW 01/02/2020 1227   APPEARANCEUR CLEAR 01/02/2020 1227   LABSPEC 1.006 01/02/2020 1227   PHURINE 9.0 (H) 01/02/2020 1227   GLUCOSEU NEGATIVE 01/02/2020 1227   HGBUR NEGATIVE 01/02/2020 [place] 01/02/2020 [place] 01/02/2020 1227   PROTEINUR NEGATIVE 01/02/2020 1227   UROBILINOGEN 0.2 09/10/2015 0940   NITRITE NEGATIVE 01/02/2020 1227   LEUKOCYTESUR NEGATIVE 01/02/2020 1227     Gerold Sar M.D. [facility] 01/05/2020, 11:25 AM   Call night coverage person covering after 7pm

## 2020-01-05 NOTE — Progress Notes (Signed)
Transferred to OR at this time. D FranklinRN

## 2020-01-05 NOTE — Anesthesia Preprocedure Evaluation (Addendum)
Anesthesia Evaluation  Patient identified by MRN, date of birth, ID band Patient awake    Reviewed: Allergy & Precautions, NPO status , Patient's Chart, lab work & pertinent test results, Unable to perform ROS - Chart review onlyPreop documentation limited or incomplete due to emergent nature of procedure.  History of Anesthesia Complications Negative for: history of anesthetic complications  Airway Mallampati: II  TM Distance: >3 FB Neck ROM: Full    Dental  (+) Dental Advisory Given, Edentulous Upper   Pulmonary shortness of breath, pneumonia, unresolved, former smoker,   Non-small cell lung cancer    + rhonchi  + decreased breath sounds      Cardiovascular hypertension, + dysrhythmias Atrial Fibrillation  Rhythm:Irregular Rate:Tachycardia   '21 TTE - EF 60 to 65%. Grade I diastolic dysfunction (impaired relaxation). Large pericardial effusion. The pericardial effusion is anterior to the right ventricle. There is inversion of the right ventricular wall and excessive  respiratory variation in the tricuspid valve spectral Doppler velocities. Mild MR and TR. Mildly elevated pulmonary artery systolic pressure.   From Cardiology consult - "On my review this is a moderate pericardial effusion measuring up to 1.8 cm.  Per the ASE guidelines a moderate effusion is up to 2 cm.  He has no evidence of RV diastolic collapse.  He has no evidence of respiratory variation across the mitral valve which is a more specific marker of tamponade.  His clinical examination demonstrates normotension and normal sinus rhythm without tachycardia.  He has no evidence of jugular venous distention.  Clinically, there is no evidence of cardiac tamponade. -Overall, this is a moderate pericardial effusion likely related to his underlying malignancy.  This has been seen on prior CT abdomen/pelvis.  I suspect this is a chronic effusion especially as he has fibrous  stranding in the pericardial space.   -There is no indication for pericardial drainage at this time.  There is no evidence of tamponade."    Neuro/Psych PSYCHIATRIC DISORDERS Anxiety    GI/Hepatic GERD  ,(+)     substance abuse  alcohol use,  Diverticulitis    Endo/Other  Hypothyroidism  Hx adrenal hemorrhage  SIADH Hyponatremia (chronic) Hypokalemia Hypocalcemia    Renal/GU      Musculoskeletal  (+) Arthritis ,   Abdominal   Peds  Hematology  On eliquis    Anesthesia Other Findings Covid positive, was reportedly 80% SaO2 on room air earlier, now >95% on 2L O2 via [place]  Reproductive/Obstetrics                           Anesthesia Physical Anesthesia Plan  ASA: III and emergent  Anesthesia Plan: General   Post-op Pain Management:    Induction: Intravenous and Rapid sequence  PONV Risk Score and Plan: 4 or greater and Treatment may vary due to age or medical condition, Ondansetron and Dexamethasone  Airway Management Planned: Oral ETT  Additional Equipment: Arterial line  Intra-op Plan:   Post-operative Plan: Possible Post-op intubation/ventilation  Informed Consent: I have reviewed the patients History and Physical, chart, labs and discussed the procedure including the risks, benefits and alternatives for the proposed anesthesia with the patient or authorized representative who has indicated his/her understanding and acceptance.     Dental advisory given  Plan Discussed with: CRNA and Anesthesiologist  Anesthesia Plan Comments: (Reasonable likelihood of postoperative ventilation requirement, informed patient of this. )       Anesthesia Quick Evaluation

## 2020-01-05 NOTE — Progress Notes (Signed)
Patient complains of SOB, o2 applied, audible wheezing. Dr Tana Coast paged with orders received. Bethann Punches RN

## 2020-01-05 NOTE — Anesthesia Postprocedure Evaluation (Signed)
Anesthesia Post Note  Patient: Shawnta Zimbelman.  Procedure(s) Performed: EXPLORATORY LAPAROTOMY sigmoid colon resection creation end colostomy for perforated diverticulitis and purulent peritonitis (N/A Abdomen)     Patient location during evaluation: PACU Anesthesia Type: General Level of consciousness: awake and alert Pain management: pain level controlled Vital Signs Assessment: post-procedure vital signs reviewed and stable Respiratory status: spontaneous breathing, nonlabored ventilation, respiratory function stable and patient connected to nasal cannula oxygen Cardiovascular status: blood pressure returned to baseline and stable Postop Assessment: no apparent nausea or vomiting Anesthetic complications: no    Last Vitals:  Vitals:   01/05/20 1745 01/05/20 1800  BP: (!) 145/59 (!) 131/56  Pulse: 96 91  Resp: 16 20  Temp: 36.7 C 37 C  SpO2: 97% 98%    Last Pain:  Vitals:   01/05/20 1745  TempSrc:   PainSc: [place] Murad Staples

## 2020-01-05 NOTE — Anesthesia Procedure Notes (Signed)
Arterial Line Insertion Start/End1/31/2021 3:56 PM, 01/05/2020 4:01 PM Performed by: Audry Pili, MD, anesthesiologist  Patient location: Pre-op. Preanesthetic checklist: patient identified, IV checked, risks and benefits discussed, surgical consent, monitors and equipment checked, pre-op evaluation, timeout performed and anesthesia consent Lidocaine 1% used for infiltration and patient sedated Left, radial was placed Catheter size: 20 G Hand hygiene performed   Attempts: 1 Procedure performed without using ultrasound guided technique. Following insertion, dressing applied and Biopatch. Post procedure assessment: unchanged and normal  Post procedure complications: local hematoma. Patient tolerated the procedure well with no immediate complications. Additional procedure comments: Compression held on hematoma for 5 minutes. Appeared to halt growth. A-line draws back blood well.[name]

## 2020-01-05 NOTE — Progress Notes (Signed)
Amion text page sent, pt has 101.1 temp - 650mg  tylenol given, 3rd lg liq stool since 8pm, and urine has become more blood tinged this shift.

## 2020-01-05 NOTE — Op Note (Signed)
Preoperative diagnosis: diverticulitis with perforation  Postoperative diagnosis: diverticulitis of sigmoid colon with perforation and purulent peritonitis  Procedure: exploratory laparotomy, sigmoid resection with end colostomy creation  Surgeon: Gurney Maxin, M.D.  Asst: none  Anesthesia: general  Indications for procedure: Anthony Curry. is a 79 y.o. year old male with symptoms of abdominal pain. He was diagnosed with diverticulitis and admitted to medicine. He also was diagnosed with COVID-19. He also was continued to oral Eliquis for a fib. 3 days into hospitalization he had more pain and CT showing free air in the upper abdomen as well as increased diverticulitis inflammation with fluid around the area. Given the failure of antibiotics he was brought to the operating room for exploration.  Description of procedure: The patient was brought into the operative suite. Anesthesia was administered with General endotracheal anesthesia. WHO checklist was applied. The patient was then placed in supine position. The area was prepped and draped in the usual sterile fashion.  Next, a midline incision was made. Cautery was used to dissect through the subcutaneous tissues and fascia. The fascia was safely entered. Upon initial inspection, There was a moderate amount of murky fluid throughout the abdomen. The small intestine had multiple areas of fibrinous tissue adhered to it. Multiple areas of small intestine appeared inflamed. The sigmoid colon was identified and appeared inflamed. Next, the left White line of T[place]t was incised moving proximal. Further blunt dissection distally freed the sigmoid and proximal rectum from the surrounding contents. The ureter was identified and well below the level of dissection. The rectosigmoid junction was identified and blue contour load used to divide at this location. 2-0 prolene was used to mark the stump in 2 locations. Ligasure was used to divide the sigmoid  colon mesentery.  A location of healthy colon was identified for proximal margin and divided with blue load contour stapler. The abdomen was irrigated in all quadrants, moderate brown fluid was removed from the perihepatic space. The remainder of the colon and appendix appeared normal.  A location on the left abdominal wall was chosen for colostomy location. A circular incision was made in the skin and blunt dissection was used to identify the anterior rectus sheath. A cruciate incision was made through the anterior rectus sheath, the muscle fibers were bluntly dissected to identify the posterior rectus sheath and a cruciate incision was made in the posterior rectus sheath and dilated to fit the colon. The colon was passed through the site and clamped extracorporeally.  Next, the abdomen was irrigated with warm saline. There was small amount of ooze from the retroperitoneum which improved with pressure from laparotomy sponges. The fascia was closed using a 0 PDS in running fashion. 30 ml marcaine was injected into the area around the wound. The subcutaneous wound was covered with a towel. The ostomy was matured with 3-0 vicryl with a small amount of Brooking in the cardinal direction.The skin was left open and packed with a salinated gauze.  Findings: inflamed colon with purulent peritonitis  Specimen: sigmoid colon, suture marks proximal  Implant: 19 fr blake drain in right lower quadrant with tip lying in pelvis   Blood loss: 100 ml  Local anesthesia: 30 ml 6.3% marcaine   Complications: none  Gurney Maxin, M.D. [facility]

## 2020-01-06 ENCOUNTER — Inpatient Hospital Stay (HOSPITAL_COMMUNITY): Payer: Medicare Other | Admitting: Anesthesiology

## 2020-01-06 ENCOUNTER — Encounter (HOSPITAL_COMMUNITY)
Admission: EM | Disposition: A | Discharge status: Skilled Nursing Facility | Payer: Self-pay | Source: Home / Self Care | Attending: Internal Medicine

## 2020-01-06 ENCOUNTER — Inpatient Hospital Stay (HOSPITAL_COMMUNITY): Payer: Medicare Other

## 2020-01-06 DIAGNOSIS — I998 Other disorder of circulatory system: Secondary | ICD-10-CM

## 2020-01-06 DIAGNOSIS — I4819 Other persistent atrial fibrillation: Secondary | ICD-10-CM

## 2020-01-06 DIAGNOSIS — R209 Unspecified disturbances of skin sensation: Secondary | ICD-10-CM

## 2020-01-06 HISTORY — PX: EMBOLECTOMY: SHX44

## 2020-01-06 LAB — PREPARE FRESH FROZEN PLASMA

## 2020-01-06 LAB — CBC
HCT: 35.6 % — ABNORMAL LOW (ref 39.0–52.0)
Hemoglobin: 12.1 g/dL — ABNORMAL LOW (ref 13.0–17.0)
MCH: 31.2 pg (ref 26.0–34.0)
MCHC: 34 g/dL (ref 30.0–36.0)
MCV: 91.8 fL (ref 80.0–100.0)
Platelets: 220 10*3/uL (ref 150–400)
RBC: 3.88 MIL/uL — ABNORMAL LOW (ref 4.22–5.81)
RDW: 14.3 % (ref 11.5–15.5)
WBC: 13.8 10*3/uL — ABNORMAL HIGH (ref 4.0–10.5)
nRBC: 0 % (ref 0.0–0.2)

## 2020-01-06 LAB — BASIC METABOLIC PANEL
Anion gap: 9 (ref 5–15)
BUN: 20 mg/dL (ref 8–23)
CO2: 21 mmol/L — ABNORMAL LOW (ref 22–32)
Calcium: 8.1 mg/dL — ABNORMAL LOW (ref 8.9–10.3)
Chloride: 102 mmol/L (ref 98–111)
Creatinine, Ser: 0.73 mg/dL (ref 0.61–1.24)
GFR calc Af Amer: >60 mL/min (ref >60)
GFR calc non Af Amer: >60 mL/min (ref >60)
Glucose, Bld: 148 mg/dL — ABNORMAL HIGH (ref 70–99)
Potassium: 4.1 mmol/L (ref 3.5–5.1)
Sodium: 132 mmol/L — ABNORMAL LOW (ref 135–145)

## 2020-01-06 LAB — BPAM FFP
Blood Product Expiration Date: 202102011614
ISSUE DATE / TIME: 202101312014
Unit Type and Rh: 5100

## 2020-01-06 LAB — D-DIMER, QUANTITATIVE: D-Dimer, Quant: 5.3 ug/mL-FEU — ABNORMAL HIGH (ref 0.00–0.50)

## 2020-01-06 LAB — C-REACTIVE PROTEIN: CRP: 21.5 mg/dL — ABNORMAL HIGH (ref <1.0)

## 2020-01-06 LAB — FERRITIN: Ferritin: 211 ng/mL (ref 24–336)

## 2020-01-06 SURGERY — EMBOLECTOMY
Anesthesia: Monitor Anesthesia Care | Site: Arm Lower | Laterality: Left

## 2020-01-06 MED ORDER — SODIUM CHLORIDE 0.9 % IV SOLN
INTRAVENOUS | Status: DC | PRN
  Administered 2020-01-06: 500 mL

## 2020-01-06 MED ORDER — SODIUM CHLORIDE 0.9 % IV SOLN
INTRAVENOUS | Status: AC
  Filled 2020-01-06: qty 1.2

## 2020-01-06 MED ORDER — LIDOCAINE HCL (PF) 1 % IJ SOLN
INTRAMUSCULAR | Status: AC
  Filled 2020-01-06: qty 30

## 2020-01-06 MED ORDER — BOOST / RESOURCE BREEZE PO LIQD CUSTOM: 1.0000 | Freq: Three times a day (TID) | ORAL | Status: DC

## 2020-01-06 MED ORDER — 0.9 % SODIUM CHLORIDE (POUR BTL) OPTIME
TOPICAL | Status: DC | PRN
  Administered 2020-01-06: 1000 mL

## 2020-01-06 MED ORDER — FENTANYL CITRATE (PF) 250 MCG/5ML IJ SOLN
INTRAMUSCULAR | Status: AC
  Filled 2020-01-06: qty 5

## 2020-01-06 MED ORDER — HEPARIN SODIUM (PORCINE) 1000 UNIT/ML IJ SOLN
INTRAMUSCULAR | Status: DC | PRN
  Administered 2020-01-06: 7000 [IU] via INTRAVENOUS

## 2020-01-06 MED ORDER — LEVOTHYROXINE SODIUM 100 MCG/5ML IV SOLN
25.0000 ug | Freq: Every day | INTRAVENOUS | Status: DC
  Administered 2020-01-06 – 2020-01-08 (×3): 25 ug via INTRAVENOUS
  Filled 2020-01-06 (×3): qty 5

## 2020-01-06 MED ORDER — LIDOCAINE HCL 1 % IJ SOLN
INTRAMUSCULAR | Status: DC | PRN
  Administered 2020-01-06: 9 mL

## 2020-01-06 MED ORDER — MIDAZOLAM HCL 5 MG/5ML IJ SOLN
INTRAMUSCULAR | Status: DC | PRN
  Administered 2020-01-06: 1 mg via INTRAVENOUS

## 2020-01-06 MED ORDER — LACTATED RINGERS IV SOLN: INTRAVENOUS | Status: DC | PRN

## 2020-01-06 MED ORDER — SODIUM CHLORIDE 0.9 % IV SOLN
INTRAVENOUS | Status: DC | PRN
  Administered 2020-01-06: 22:00:00 250 mL via INTRAVENOUS

## 2020-01-06 MED ORDER — MIDAZOLAM HCL 2 MG/2ML IJ SOLN
INTRAMUSCULAR | Status: AC
  Filled 2020-01-06: qty 2

## 2020-01-06 MED ORDER — DEXTROSE-NACL 5-0.45 % IV SOLN: INTRAVENOUS | Status: DC

## 2020-01-06 MED ORDER — FENTANYL CITRATE (PF) 100 MCG/2ML IJ SOLN
INTRAMUSCULAR | Status: DC | PRN
  Administered 2020-01-06: 50 ug via INTRAVENOUS

## 2020-01-06 MED ORDER — PROPOFOL 500 MG/50ML IV EMUL
INTRAVENOUS | Status: DC | PRN
  Administered 2020-01-06: 25 ug/kg/min via INTRAVENOUS

## 2020-01-06 MED ORDER — LIDOCAINE HCL (CARDIAC) PF 100 MG/5ML IV SOSY
PREFILLED_SYRINGE | INTRAVENOUS | Status: DC | PRN
  Administered 2020-01-06: 60 mg via INTRAVENOUS

## 2020-01-06 MED ORDER — HEPARIN (PORCINE) 25000 UT/250ML-% IV SOLN
900.0000 [IU]/h | INTRAVENOUS | Status: DC
  Administered 2020-01-06: 21:00:00 900 [IU]/h via INTRAVENOUS
  Filled 2020-01-06: qty 250

## 2020-01-06 SURGICAL SUPPLY — 53 items
ADH SKN CLS APL DERMABOND .7 (GAUZE/BANDAGES/DRESSINGS) ×1
BAG BANDED W/RUBBER/TAPE 36X54 (MISCELLANEOUS) ×1 IMPLANT
BAG EQP BAND 135X91 W/RBR TAPE (MISCELLANEOUS) ×1
CANISTER SUCT 3000ML PPV (MISCELLANEOUS) ×2 IMPLANT
CATH EMB 3FR 80CM (CATHETERS) ×1 IMPLANT
CATH EMB 4FR 80CM (CATHETERS) ×1 IMPLANT
CATH EMB 5FR 80CM (CATHETERS) IMPLANT
CLIP VESOCCLUDE MED 24/CT (CLIP) ×1 IMPLANT
CLIP VESOCCLUDE MED 6/CT (CLIP) ×1 IMPLANT
CLIP VESOCCLUDE SM WIDE 24/CT (CLIP) ×1 IMPLANT
CLIP VESOCCLUDE SM WIDE 6/CT (CLIP) ×1 IMPLANT
COVER DOME SNAP 22 D (MISCELLANEOUS) ×1 IMPLANT
DECANTER SPIKE VIAL GLASS SM (MISCELLANEOUS) ×1 IMPLANT
DERMABOND ADVANCED (GAUZE/BANDAGES/DRESSINGS) ×1
DERMABOND ADVANCED .7 DNX12 (GAUZE/BANDAGES/DRESSINGS) IMPLANT
DRAIN SNY 10X20 3/4 PERF (WOUND CARE) IMPLANT
ELECT REM PT RETURN 9FT ADLT (ELECTROSURGICAL) ×2
ELECTRODE REM PT RTRN 9FT ADLT (ELECTROSURGICAL) ×1 IMPLANT
EVACUATOR SILICONE 100CC (DRAIN) IMPLANT
GLOVE BIO SURGEON STRL SZ7.5 (GLOVE) ×3 IMPLANT
GLOVE BIOGEL PI IND STRL 6.5 (GLOVE) IMPLANT
GLOVE BIOGEL PI IND STRL 7.5 (GLOVE) IMPLANT
GLOVE BIOGEL PI INDICATOR 6.5 (GLOVE) ×2
GLOVE BIOGEL PI INDICATOR 7.5 (GLOVE) ×1
GLOVE ECLIPSE 6.5 STRL STRAW (GLOVE) ×1 IMPLANT
GOWN STRL REUS W/ TWL LRG LVL3 (GOWN DISPOSABLE) ×3 IMPLANT
GOWN STRL REUS W/TWL LRG LVL3 (GOWN DISPOSABLE) ×6
KIT BASIN OR (CUSTOM PROCEDURE TRAY) ×2 IMPLANT
KIT TURNOVER KIT B (KITS) ×2 IMPLANT
NDL HYPO 25GX1X1/2 BEV (NEEDLE) IMPLANT
NEEDLE HYPO 25GX1X1/2 BEV (NEEDLE) IMPLANT
NS IRRIG 1000ML POUR BTL (IV SOLUTION) ×3 IMPLANT
PACK PERIPHERAL VASCULAR (CUSTOM PROCEDURE TRAY) ×2 IMPLANT
PAD ARMBOARD 7.5X6 YLW CONV (MISCELLANEOUS) ×4 IMPLANT
SET COLLECT BLD 21X3/4 12 (NEEDLE) IMPLANT
SPONGE SURGIFOAM ABS GEL 100 (HEMOSTASIS) IMPLANT
STAPLER VISISTAT 35W (STAPLE) IMPLANT
STOPCOCK 4 WAY LG BORE MALE ST (IV SETS) IMPLANT
SUT PROLENE 5 0 C 1 24 (SUTURE) ×1 IMPLANT
SUT PROLENE 6 0 CC (SUTURE) ×2 IMPLANT
SUT VIC AB 2-0 CTX 36 (SUTURE) ×1 IMPLANT
SUT VIC AB 3-0 SH 27 (SUTURE) ×2
SUT VIC AB 3-0 SH 27X BRD (SUTURE) ×1 IMPLANT
SUT VIC AB 4-0 PS2 18 (SUTURE) ×1 IMPLANT
SYR 10ML LL (SYRINGE) ×2 IMPLANT
SYR 20ML LL LF (SYRINGE) ×1 IMPLANT
SYR 3ML LL SCALE MARK (SYRINGE) ×2 IMPLANT
SYR CONTROL 10ML LL (SYRINGE) ×1 IMPLANT
TOWEL GREEN STERILE (TOWEL DISPOSABLE) ×2 IMPLANT
TRAY FOLEY MTR SLVR 16FR STAT (SET/KITS/TRAYS/PACK) ×1 IMPLANT
TUBING EXTENTION W/L.L. (IV SETS) IMPLANT
UNDERPAD 30X30 (UNDERPADS AND DIAPERS) ×2 IMPLANT
WATER STERILE IRR 1000ML POUR (IV SOLUTION) ×2 IMPLANT

## 2020-01-06 NOTE — Consult Note (Signed)
[place] nurse consulted for new ostomy.  Patient up from surgery just last evening.  Discussed with bedside nurse. 1pc in place some minimal stool in the pouch. Patient is not ready for teaching at this time.  Will assess daily on needs for ostomy care and teaching. Bedside nurse will notify family (daughter) that I will be reaching out to her to possibly FT for teaching session later this week.   Supplies are on the unit should they need them before I physically see the patient.   [place] Nurse will follow along with you for continued support with ostomy teaching and care [place] MSN, RN, [place], [place], [place]

## 2020-01-06 NOTE — Progress Notes (Signed)
Left upper extremity venous duplex completed. Refer to "CV Proc" under chart review to view preliminary results.  Critical results discussed with Dr. Tana Coast.  01/06/2020 11:26 AM Kelby Aline., MHA, RVT, RDCS, RDMS

## 2020-01-06 NOTE — Progress Notes (Signed)
Patient called his family to updated them he was going to surgery.

## 2020-01-06 NOTE — Progress Notes (Addendum)
[facility]                                                                              Patient Demographics  Anthony Curry, is a 79 y.o. male, DOB - Oct 25, 1941, VHQ:[id number]  Admit date - 01/02/2020   Admitting Physician Shelly Coss, MD  Outpatient Primary MD for the patient is [name], MD  Outpatient specialists:   LOS - 4  days   Medical records reviewed and are as summarized below:    Chief Complaint  Patient presents with  . Abdominal Pain       Brief summary   Patient is a 79 year old male with history of stage IV non-small cell lung CA, SIADH, paroxysmal A. fib, hypothyroidism, depression, anxiety presents from home with c/o LLQ abdominal pain x 1 day associated with diarrhea. Patient follows with Dr. [name] for right non-small cell lung cancer and is currently on chemotherapy. He lives with his wife and daughter. He is functional and ambulatory at baseline. His wife recently tested positive for Covid and was admitted at [name]. His COVID screen test from [facility] resulted positive. CT abdomen showed acute diverticulitis at the junction of distal descending and sigmoid colon.  No abscess or perforation.  Assessment & Plan    Principal Problem: Acute diverticulitis with perforation  -  CT abdomen showed acute diverticulitis at the junction of distal descending and sigmoid colon, no abscess or perforation.  On 1/31, patient complaining of worsening pain, fevers.  WBC count worsening. -CT abdomen was repeated which showed perforation with moderate free intraperitoneal air with adynamic ileus -General surgery was consulted, underwent urgent ex lap, sigmoid resection with end colostomy creation, JP drain.  Postop day #1 -Started on clears today  Active Problems:  Acute hypoxic respiratory failure due to acute COVID-19 viral pneumonia during the ongoing COVID-19 pandemic- POA -Currently hypoxic, O2 sats 98% on 2 L, states breathing better  today -Status post convalescent plasma x1 on 1/31, started on remdesivir per pharmacy, day #2 today - Actemra not given due to ongoing acute diverticulitis, on immunotherapy for lung CA.  Hold off on IV steroids due to perforation, active infection. - D-dimer is trending up however patient was on full dose apixaban, now on hold  - Continue Supportive care: vitamin C/zinc, albuterol, Tylenol. - Continue to wean oxygen, ambulatory O2 screening daily as tolerated  - Oxygen - SpO2: 98 % O2 Flow Rate (L/min): 2 L/min - Continue to follow labs as below  Lab Results  Component Value Date   SARSCOV2NAA POSITIVE (A) 01/02/2020   SARSCOV2NAA Detected (A) 01/01/2020   [place] Not Detected 12/27/2019   [place] Not Detected 11/25/2019     Recent Labs  Lab 01/02/20 1041 01/02/20 1826 01/03/20 0330 01/04/20 0225 01/05/20 0340 01/06/20 0540  DDIMER  --  0.99* 0.87* 2.37* 4.48* 5.30*  FERRITIN  --  132 149 167 194 211  CRP  --  13.1* 18.2* 17.9* 22.8* 21.5*  ALT 21  --  18 15 16   --       Hyponatremia - Has fluctuating sodium levels 128-135, likely associated with SIADH related to lung CA -  cont demeclocycline  -Sodium improving  Left hand coolness -At the site of art line, obtain arterial duplex stat -Discussed with vascular surgery, recommended to obtain arterial duplex and call them with the results.  -If patient has acute thrombus, will need IV heparin however has hematuria currently Addendum:11:20am Arterial duplex showed:  He has dampened monophasic flow in his left subclavian artery, suggestive of proximal obstruction. Radial, ulnar, and palmar arch signals are barely detectable. Right subclavian artery exhibits normal flow. -Vascular surgery paged, will await their evaluation and recommendations  Addendum: 11:30 am Vascular surgery recommended transfer to [facility] for evaluation, Dr. Oneida Alar will evaluate patient on arrival.  No heparin drip, vascular surgery  will evaluate the patient and recommend. -Explained the above to the patient's daughter and to the patient, agree with the transfer and management.   History of stage IV non-small cell lung CA -Patient was scheduled for a chemo infusion day prior to admission, but was canceled due to Covid. -Chemotherapy currently on hold secondary to acute Covid and acute complicated diverticulitis with perforation  Paroxysmal atrial fibrillation (HCC) - Continue Cardizem for rate control - Eliquis currently on hold  Pericardial effusion -CT scan showed mild to moderate pericardial effusion, possibly associated with his malignancy -Echo read as grade 1 diastolic dysfunction with preserved EF, large pericardial effusion anterior to right ventricle with the motion of the right ventricle wall -Asymptomatic, cardiology was consulted, seen by Dr. Davina Poke, moderate effusion possibly due to malignancy, continue anticoagulation, no acute interventions recommended  BPH/urinary retention, hematuria -Continue tamsulosin, Foley catheter placed during admission on 1/29 -Likely due to BPH, was on Eliquis up till 1/31 AM -Urology consulted, discussed with Dr. Claudia Desanctis, recommended DC Foley catheter, voiding trial.  However she will evaluate, if patient having frank blood, will place him on CBI.  Await evaluation.  Hypothyroidism Continue Synthroid  Mild protein calorie malnutrition Continue protein supplements  Code Status: Full DVT Prophylaxis: Apixaban placed on hold, will place on SCDs Family Communication: Discussed all imaging results, lab results, explained to the patient and updated patient's daughter on the phone   Disposition Plan: From home, status post surgery from perforated complicated diverticulitis on 1/31.  Will need to recover from the surgery and DC when cleared by surgery service.    Time Spent in minutes 25 minutes  Procedures:  None  Consultants:   Cardiology  Antimicrobials:    Anti-infectives (From admission, onward)   Start     Dose/Rate Route Frequency Ordered Stop   01/06/20 1000  remdesivir 100 mg in sodium chloride 0.9 % 100 mL IVPB     100 mg 200 mL/hr over 30 Minutes Intravenous Daily 01/05/20 0846 01/10/20 0959   01/05/20 1000  remdesivir 200 mg in sodium chloride 0.9% 250 mL IVPB     200 mg 580 mL/hr over 30 Minutes Intravenous Once 01/05/20 0846 01/05/20 1002   01/02/20 2000  piperacillin-tazobactam (ZOSYN) IVPB 3.375 g     3.375 g 12.5 mL/hr over 240 Minutes Intravenous Every 8 hours 01/02/20 1415     01/02/20 1500  demeclocycline (DECLOMYCIN) tablet 150 mg    Note to Pharmacy: Take 2 tablets twice a day     150 mg Oral 2 times daily 01/02/20 1408     01/02/20 1345  piperacillin-tazobactam (ZOSYN) IVPB 3.375 g     3.375 g 100 mL/hr over 30 Minutes Intravenous  Once 01/02/20 1331 01/02/20 1528         Medications  Scheduled Meds: . sodium  chloride   Intravenous Once  . vitamin C  500 mg Oral BID  . chlorhexidine  15 mL Mouth Rinse BID  . Chlorhexidine Gluconate Cloth  6 each Topical Daily  . demeclocycline  150 mg Oral BID  . diltiazem  180 mg Oral Q1200  . feeding supplement  1 Container Oral TID BM  . fluticasone  2 spray Each Nare QHS  . heparin injection (subcutaneous)  5,000 Units Subcutaneous Q8H  . Ipratropium-Albuterol  1 puff Inhalation Q6H  . levothyroxine  25 mcg Intravenous Daily  . mouth rinse  15 mL Mouth Rinse q12n4p  . mirtazapine  30 mg Oral QHS  . mometasone-formoterol  2 puff Inhalation BID  . tamsulosin  0.4 mg Oral BID  . thiamine  100 mg Oral Daily  . zinc sulfate  220 mg Oral Daily   Continuous Infusions: . piperacillin-tazobactam (ZOSYN)  IV Stopped (01/06/20 0336)  . remdesivir 100 mg in NS 100 mL 100 mg (01/06/20 1012)  . sodium chloride Stopped (01/05/20 2120)   PRN Meds:.acetaminophen, albuterol, ALPRAZolam, hydrALAZINE, HYDROmorphone (DILAUDID) injection, ondansetron (ZOFRAN) IV,  oxyCODONE      Subjective:   Anthony Curry was seen and examined today.  Feels dry and wanted apple juice at the time of my examination.  Status post emergent ex lap and colostomy for perforated diverticulitis, moderate pain medication, pain 7/10 in the abdomen.  States breathing is better today.  No chest pain.  Hematuria+   Objective:   Vitals:   01/06/20 0700 01/06/20 0800 01/06/20 0900 01/06/20 1000  BP: (!) 133/44 (!) 166/49 (!) 153/48 (!) 158/51  Pulse: 64 72 70 72  Resp: 13 14 19 16   Temp:  98.6 F (37 C)    TempSrc:  Oral    SpO2: 97% 98% 99% 98%  Weight:      Height:        Intake/Output Summary (Last 24 hours) at 01/06/2020 1028 Last data filed at 01/06/2020 0600 Gross per 24 hour  Intake 2650.49 ml  Output 1695 ml  Net 955.49 ml     Wt Readings from Last 3 Encounters:  01/02/20 65.8 kg  12/02/19 66.6 kg  10/29/19 65 kg    Physical Exam  General: Alert and oriented x 3, uncomfortable with pain  Eyes:   HEENT:  Atraumatic, normocephalic  Cardiovascular: S1 S2 clear, no murmurs, RRR. No pedal edema b/l  Respiratory: CTAB, no wheezing, rales or rhonchi  Gastrointestinal: Soft, dressing intact, colostomy+, JP drain  Ext: no pedal edema bilaterally  Neuro: no new deficits  Musculoskeletal: No cyanosis, clubbing  Skin: No rashes  Psych: Normal affect and demeanor, alert and oriented x3    Data Reviewed:  I have personally reviewed following labs and imaging studies  Micro Results Recent Results (from the past 240 hour(s))  Novel Coronavirus, NAA ([facility])     Status: None   Collection Time: 12/27/19  1:41 PM   Specimen: Nasopharyngeal(NP) swabs in vial transport medium   NASOPHARYNGE  TESTING  Result Value Ref Range Status   SARS-CoV-2, NAA Not Detected Not Detected Final    Comment: This nucleic acid amplification test was developed and its performance characteristics determined by [employer]. Nucleic acid amplification tests  include RT-PCR and TMA. This test has not been FDA cleared or approved. This test has been authorized by FDA under an Emergency Use Authorization (EUA). This test is only authorized for the duration of time the declaration that circumstances exist justifying the authorization of  the emergency use of in vitro diagnostic tests for detection of SARS-CoV-2 virus and/or diagnosis of COVID-19 infection under section 564(b)(1) of the Act, 21 U.S.C. 211HER-7(E) (1), unless the authorization is terminated or revoked sooner. When diagnostic testing is negative, the possibility of a false negative result should be considered in the context of a patient's recent exposures and the presence of clinical signs and symptoms consistent with COVID-19. An individual without symptoms of COVID-19 and who is not shedding SARS-CoV-2 virus wo uld expect to have a negative (not detected) result in this assay.   Novel Coronavirus, NAA ([facility])     Status: Abnormal   Collection Time: 01/01/20  2:24 PM   Specimen: Nasopharyngeal(NP) swabs in vial transport medium   NASOPHARYNGE  TESTING  Result Value Ref Range Status   SARS-CoV-2, NAA Detected (A) Not Detected Final    Comment: This nucleic acid amplification test was developed and its performance characteristics determined by [employer]. Nucleic acid amplification tests include RT-PCR and TMA. This test has not been FDA cleared or approved. This test has been authorized by FDA under an Emergency Use Authorization (EUA). This test is only authorized for the duration of time the declaration that circumstances exist justifying the authorization of the emergency use of in vitro diagnostic tests for detection of SARS-CoV-2 virus and/or diagnosis of COVID-19 infection under section 564(b)(1) of the Act, 21 U.S.C. 081KGY-1(E) (1), unless the authorization is terminated or revoked sooner. When diagnostic testing is negative, the possibility of a  false negative result should be considered in the context of a patient's recent exposures and the presence of clinical signs and symptoms consistent with COVID-19. An individual without symptoms of COVID-19 and who is not shedding SARS-CoV-2 virus wo uld expect to have a negative (not detected) result in this assay.   SARS CORONAVIRUS 2 (TAT 6-24 HRS) Nasopharyngeal Nasopharyngeal Swab     Status: Abnormal   Collection Time: 01/02/20  2:29 PM   Specimen: Nasopharyngeal Swab  Result Value Ref Range Status   SARS Coronavirus 2 POSITIVE (A) NEGATIVE Final    Comment: RESULT CALLED TO, READ BACK BY AND VERIFIED WITH: T BAKER,RN 2015 01/02/2020 D BRADLEY (NOTE) SARS-CoV-2 target nucleic acids are DETECTED. The SARS-CoV-2 RNA is generally detectable in upper and lower respiratory specimens during the acute phase of infection. Positive results are indicative of the presence of SARS-CoV-2 RNA. Clinical correlation with patient history and other diagnostic information is  necessary to determine patient infection status. Positive results do not rule out bacterial infection or co-infection with other viruses.  The expected result is Negative. Fact Sheet for Patients: SugarRoll.be Fact Sheet for Healthcare Providers: https://www.woods-mathews.com/ This test is not yet approved or cleared by the [place] FDA and  has been authorized for detection and/or diagnosis of SARS-CoV-2 by FDA under an Emergency Use Authorization (EUA). This EUA will remain  in effect (meaning this test can be used) for the  duration of the COVID-19 declaration under Section 564(b)(1) of the Act, 21 U.S.C. section 360bbb-3(b)(1), unless the authorization is terminated or revoked sooner. Performed at [facility], [place] [address], [place], [place] [address]   Culture, blood (routine x 2)     Status: None (Preliminary result)   Collection Time: 01/03/20 10:10 PM    Specimen: BLOOD  Result Value Ref Range Status   Specimen Description   Final    BLOOD RIGHT ANTECUBITAL Performed at [facility], [place] [address] [place], [place] [address]  Special Requests   Final    BOTTLES DRAWN AEROBIC AND ANAEROBIC Blood Culture adequate volume Performed at [facility] [address], [place], [place] [address]    Culture   Final    NO GROWTH 2 DAYS Performed at [facility] [address] [place], [place] [address]    Report Status PENDING  Incomplete  Culture, blood (routine x 2)     Status: None (Preliminary result)   Collection Time: 01/03/20 10:10 PM   Specimen: BLOOD  Result Value Ref Range Status   Specimen Description   Final    BLOOD LEFT HAND Performed at [facility] [address], [place], [place] [address]    Special Requests   Final    BOTTLES DRAWN AEROBIC AND ANAEROBIC Blood Culture adequate volume Performed at [facility] [address], [place], [place] [address]    Culture   Final    NO GROWTH 2 DAYS Performed at [facility] [address] [place], [place] [address]    Report Status PENDING  Incomplete  Surgical pcr screen     Status: None   Collection Time: 01/05/20  2:45 PM   Specimen: Nasal Mucosa; Nasal Swab  Result Value Ref Range Status   MRSA, PCR NEGATIVE NEGATIVE Final   Staphylococcus aureus NEGATIVE NEGATIVE Final    Comment: (NOTE) The Xpert SA Assay (FDA approved for NASAL specimens in patients 84 years of age and older), is one component of a comprehensive surveillance program. It is not intended to diagnose infection nor to guide or monitor treatment. Performed at [facility], [place] [address] [place],  [address]     Radiology Reports DG Chest 2 View  Result Date: 01/05/2020 CLINICAL DATA:  Patient with stage IV lung cancer. Left lower quadrant abdominal pain. Diarrhea. EXAM: CHEST - 2 VIEW  COMPARISON:  CT scan from January 05, 2020 at 11:39 a.m. FINDINGS: Free air seen under the diaphragm, also appreciated on the CT scan from earlier today. Healed right rib fractures. Mild infiltrate in the medial right lung base, more prominent since January 02, 2020. Pleural thickening versus small left pleural effusion. Atelectasis or scar in the left base. The heart, hila, mediastinum, lungs, and pleura are otherwise unchanged. IMPRESSION: 1. Free air under the diaphragm. This finding was seen on the CT scan from earlier today and called to Dr. Tana Coast by Dr. Jobe Igo at 12:30 p.m. 2. Mild opacity in the right base is likely atelectasis based on CT imaging from earlier today. 3. No other changes. Electronically Signed   By: Dorise Bullion III M.D   On: 01/05/2020 12:43   CT ABDOMEN PELVIS W CONTRAST  Result Date: 01/05/2020 CLINICAL DATA:  Nausea and vomiting. Acute diverticulitis. Worsening pain. Fevers. EXAM: CT ABDOMEN AND PELVIS WITH CONTRAST TECHNIQUE: Multidetector CT imaging of the abdomen and pelvis was performed using the standard protocol following bolus administration of intravenous contrast. CONTRAST:  163mL OMNIPAQUE IOHEXOL 300 MG/ML  SOLN COMPARISON:  01/02/2020 FINDINGS: Lower chest: Bibasilar atelectasis. Normal heart size with small chronic pericardial effusion. Small bilateral pleural effusions, new. Multivessel coronary artery atherosclerosis. Hepatobiliary: Scattered well-circumscribed low-density liver lesions are likely cysts. Normal gallbladder, without biliary ductal dilatation. Pancreas: Normal, without mass or ductal dilatation. Spleen: Normal in size, without focal abnormality. Adrenals/Urinary Tract: Normal left adrenal gland. Mild right adrenal thickening. Right renal cortical thinning. No hydronephrosis. Decompressed urinary bladder around a Foley catheter. Stomach/Bowel: Normal stomach, without  wall thickening. Scattered colonic diverticula. Increased wall thickening involving the  proximal sigmoid, including on 67/2. Mid small bowel loops measure up to 2.4 cm. Pelvic small bowel loops are mildly thickened, including on 73/2. Vascular/Lymphatic: Advanced aortic and branch vessel atherosclerosis. No abdominopelvic adenopathy. Reproductive: Normal prostate. Other: New small volume cul-de-sac fluid, including on 71/2. Extensive, moderate volume free intraperitoneal air, including within the right upper quadrant on 27/2 and adjacent the right lobe of the liver on 29/2. Multiple small locules of pelvic free air, including on 61/2. Enhancing small volume fluid within the anterior left pelvis on 66/2 and up to 3.8 x 1.5 cm just inferior to this on 71/2. Musculoskeletal: Bilateral remote rib fractures. Lumbosacral spondylosis. IMPRESSION: 1. Moderate free intraperitoneal air, likely due to complicated sigmoid diverticulitis. Small volume pericolonic fluid and enhancement, suspicious for infected ascites. 2. Borderline small bowel dilatation, suggesting adynamic ileus. Small bowel wall thickening within the pelvis is likely due to secondary enteritis. 3. New small bilateral pleural effusions with similar pericardial effusion. 4. Coronary artery atherosclerosis. Aortic Atherosclerosis (ICD10-I70.0). Critical test results telephoned to Dr. Tana Coast. At the time of interpretation at 12:30 p.m.On 01/05/2020. Electronically Signed   By: Abigail Miyamoto M.D.   On: 01/05/2020 12:32   CT ABDOMEN PELVIS W CONTRAST  Result Date: 01/02/2020 CLINICAL DATA:  Lower abdominal pain. The patient is on immunotherapy for lung carcinoma. EXAM: CT ABDOMEN AND PELVIS WITH CONTRAST TECHNIQUE: Multidetector CT imaging of the abdomen and pelvis was performed using the standard protocol following bolus administration of intravenous contrast. CONTRAST:  100 mL OMNIPAQUE IOHEXOL 300 MG/ML  SOLN COMPARISON:  CT chest, abdomen and pelvis 05/13/2019. FINDINGS: Lower chest: No pleural effusion. Small pericardial effusion is unchanged.  Lung bases are emphysematous. There is mild dependent atelectasis. Hepatobiliary: A few small low attenuating lesions in the liver likely representing cysts are unchanged. Status post cholecystectomy. Biliary tree unremarkable. Pancreas: Unremarkable. No pancreatic ductal dilatation or surrounding inflammatory changes. Spleen: Normal in size without focal abnormality. 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 The left adrenal gland appears normal. There is some scarring in the right kidney which is unchanged. The kidneys otherwise appear normal. No stones or hydronephrosis. The urinary bladder is distended but otherwise unremarkable. Stomach/Bowel: Diverticulosis is identified. There is thickening and stranding near the junction of the descending and sigmoid consistent with acute diverticulitis. No abscess or perforation. The stomach and small bowel appear normal. The appendix is normal in appearance. Note is made that the cecum is positioned in the right upper quadrant. Vascular/Lymphatic: Aortic atherosclerosis. No enlarged abdominal or pelvic lymph nodes. Reproductive: Prostate is unremarkable. Other: None. Musculoskeletal: No acute or focal abnormality. Severe degenerative change throughout the visualized spine is noted. IMPRESSION: Diverticulosis with acute diverticulitis the junction of the distal descending and sigmoid colon. No abscess or perforation. No change in a moderate pericardial effusion. Emphysema. Extensive atherosclerosis. Electronically Signed   By: Inge Rise M.D.   On: 01/02/2020 13:10   DG CHEST PORT 1 VIEW  Result Date: 01/02/2020 CLINICAL DATA:  79 year old male with stage IV lung cancer, SI ADH. Undergoing chemotherapy. Positive COVID-19 yesterday. EXAM: PORTABLE CHEST 1 VIEW COMPARISON:  CT Abdomen and Pelvis earlier today. Chest CT 09/02/2019 and earlier. FINDINGS: Portable AP semi upright view at 1741 hours. Stable lung volumes and mediastinal contours  since 2020. Numerous chronic right rib fractures. Chronic reticular opacity in the right upper lobe and near the left hilum. Elsewhere when allowing for portable technique the lungs are clear.  No pneumothorax, pulmonary edema or pleural effusion. Stable visualized osseous structures. IMPRESSION: Stable radiographic appearance of chronic lung disease since July 2020. No acute cardiopulmonary abnormality. Electronically Signed   By: Genevie Ann M.D.   On: 01/02/2020 18:38   ECHOCARDIOGRAM COMPLETE  Result Date: 01/02/2020   ECHOCARDIOGRAM REPORT   Patient Name:   Aarik Blank. Date of Exam: 01/02/2020 Medical Rec #:  [id number]           Height:       71.0 in Accession #:    [id number]          Weight:       145.0 lb Date of Birth:  1941/11/01          BSA:          1.84 m Patient Age:    46 years            BP:           152/65 mmHg Patient Gender: M                   HR:           82 bpm. Exam Location:  Inpatient Procedure: 2D Echo, Cardiac Doppler and Color Doppler Indications:    Pericardial effusion 423.9  History:        Patient has prior history of Echocardiogram examinations, most                 recent 07/26/2014. Arrythmias:Atrial Fibrillation and Atrial                 Flutter; Risk Factors:Hypertension and Former Smoker. GERD.  Sonographer:    Paulita Fujita RDCS Referring Phys: [id number] AMRIT ADHIKARI  Sonographer Comments: Image acquisition challenging due to patient body habitus. IMPRESSIONS  1. Left ventricular ejection fraction, by visual estimation, is 60 to 65%. The left ventricle has normal function. There is no left ventricular hypertrophy.  2. Left ventricular diastolic parameters are consistent with Grade I diastolic dysfunction (impaired relaxation).  3. Global right ventricle has normal systolic function.The right ventricular size is normal. No increase in right ventricular wall thickness.  4. Left atrial size was normal.  5. Right atrial size was normal.  6. Large pericardial effusion.   7. The pericardial effusion is anterior to the right ventricle.  8. There is inversion of the right ventricular wall and excessive respiratory variation in the tricuspid valve spectral Doppler velocities.  9. The mitral valve is normal in structure. Mild mitral valve regurgitation. No evidence of mitral stenosis. 10. The tricuspid valve is normal in structure. 11. The tricuspid valve is normal in structure. Tricuspid valve regurgitation is mild. 12. The aortic valve is normal in structure. Aortic valve regurgitation is not visualized. No evidence of aortic valve sclerosis or stenosis. 13. The pulmonic valve was normal in structure. Pulmonic valve regurgitation is not visualized. 14. Mildly elevated pulmonary artery systolic pressure. 15. The inferior vena cava is normal in size with greater than 50% respiratory variability, suggesting right atrial pressure of 3 mmHg. FINDINGS  Left Ventricle: Left ventricular ejection fraction, by visual estimation, is 60 to 65%. The left ventricle has normal function. The left ventricle is not well visualized. There is no left ventricular hypertrophy. Left ventricular diastolic parameters are consistent with Grade I diastolic dysfunction (impaired relaxation). Indeterminate filling pressures. Right Ventricle: The right ventricular size is normal. No increase in right ventricular wall thickness. Global RV systolic function is has normal  systolic function. The tricuspid regurgitant velocity is 2.46 m/s, and with an assumed right atrial pressure  of 8 mmHg, the estimated right ventricular systolic pressure is mildly elevated at 32.2 mmHg. Left Atrium: Left atrial size was normal in size. Right Atrium: Right atrial size was normal in size Pericardium: A large pericardial effusion is present. The pericardial effusion is anterior to the right ventricle. There is inversion of the right ventricular wall and excessive respiratory variation in the tricuspid valve spectral Doppler velocities.  Pericardial effusion measuring up to 1.86 cm. Unable to visualize the IVC. Findings may be con. Mitral Valve: The mitral valve is normal in structure. Mild mitral valve regurgitation. No evidence of mitral valve stenosis by observation. Tricuspid Valve: The tricuspid valve is normal in structure. Tricuspid valve regurgitation is mild. Aortic Valve: The aortic valve is normal in structure. Aortic valve regurgitation is not visualized. The aortic valve is structurally normal, with no evidence of sclerosis or stenosis. Pulmonic Valve: The pulmonic valve was normal in structure. Pulmonic valve regurgitation is not visualized. Pulmonic regurgitation is not visualized. Aorta: The aortic root, ascending aorta and aortic arch are all structurally normal, with no evidence of dilitation or obstruction. Venous: The inferior vena cava was not well visualized. The inferior vena cava is normal in size with greater than 50% respiratory variability, suggesting right atrial pressure of 3 mmHg. IAS/Shunts: No atrial level shunt detected by color flow Doppler. There is no evidence of a patent foramen ovale. No ventricular septal defect is seen or detected. There is no evidence of an atrial septal defect.   Diastology LV e' lateral:   5.87 cm/s LV E/e' lateral: 11.5 LV e' medial:    4.90 cm/s LV E/e' medial:  13.7  RIGHT VENTRICLE RV S prime:     28.60 cm/s TAPSE (M-mode): 2.1 cm LEFT ATRIUM             Index       RIGHT ATRIUM           Index LA Vol (A2C):   30.0 ml 16.31 ml/m RA Area:     12.10 cm LA Vol (A4C):   21.2 ml 11.53 ml/m RA Volume:   30.10 ml  16.37 ml/m LA Biplane Vol: 25.3 ml 13.76 ml/m  AORTIC VALVE LVOT Vmax:   70.30 cm/s LVOT Vmean:  50.200 cm/s LVOT VTI:    0.137 m MITRAL VALVE                        TRICUSPID VALVE MV Area (PHT): 2.96 cm             TR Peak grad:   24.2 mmHg MV PHT:        74.24 msec           TR Vmax:        246.00 cm/s MV Decel Time: 256 msec MV E velocity: 67.30 cm/s 103 cm/s  SHUNTS MV A  velocity: 65.10 cm/s 70.3 cm/s Systemic VTI: 0.14 m MV E/A ratio:  1.03       1.5  Skeet Latch MD Electronically signed by Skeet Latch MD Signature Date/Time: 01/02/2020/6:33:38 PM    Final     Lab Data:  CBC: Recent Labs  Lab 01/02/20 1041 01/02/20 1041 01/03/20 0330 01/04/20 0225 01/05/20 0340 01/05/20 2035 01/06/20 0540  WBC 8.5   < > 9.2 12.5* 12.8* 14.9* 13.8*  NEUTROABS 7.0  --   --   --   --  14.1*  --   HGB 14.6   < > 14.7 14.0 13.4 12.0* 12.1*  HCT 42.4   < > 43.2 41.5 39.2 34.7* 35.6*  MCV 89.1   < > 91.1 89.8 90.3 88.7 91.8  PLT 222   < > 206 238 242 227 220   < > = values in this interval not displayed.   Basic Metabolic Panel: Recent Labs  Lab 01/02/20 1041 01/03/20 0330 01/04/20 0225 01/05/20 0340 01/06/20 0540  NA 128* 131* 131* 130* 132*  K 4.2 4.3 3.7 3.3* 4.1  CL 93* 98 100 101 102  CO2 26 23 21* 21* 21*  GLUCOSE 127* 146* 107* 83 148*  BUN 15 16 20 18 20   CREATININE 0.88 0.82 0.81 0.72 0.73  CALCIUM 9.0 8.7* 8.3* 7.7* 8.1*   GFR: Estimated Creatinine Clearance: 68.5 mL/min (by C-G formula based on SCr of 0.73 mg/dL). Liver Function Tests: Recent Labs  Lab 01/02/20 1041 01/03/20 0330 01/04/20 0225 01/05/20 0340  AST 26 23 25 21   ALT 21 18 15 16   ALKPHOS 88 71 63 64  BILITOT 1.4* 0.9 1.3* 1.0  PROT 6.5 5.8* 5.6* 4.9*  ALBUMIN 3.7 3.3* 3.0* 2.3*   Recent Labs  Lab 01/02/20 1041  LIPASE 19   No results for input(s): AMMONIA in the last 168 hours. Coagulation Profile: No results for input(s): INR, PROTIME in the last 168 hours. Cardiac Enzymes: No results for input(s): CKTOTAL, CKMB, CKMBINDEX, TROPONINI in the last 168 hours. BNP (last 3 results) No results for input(s): PROBNP in the last 8760 hours. HbA1C: No results for input(s): HGBA1C in the last 72 hours. CBG: No results for input(s): GLUCAP in the last 168 hours. Lipid Profile: No results for input(s): CHOL, HDL, LDLCALC, TRIG, CHOLHDL, LDLDIRECT in the last 72  hours. Thyroid Function Tests: No results for input(s): TSH, T4TOTAL, FREET4, T3FREE, THYROIDAB in the last 72 hours. Anemia Panel: Recent Labs    01/05/20 0340 01/06/20 0540  FERRITIN 194 211   Urine analysis:    Component Value Date/Time   COLORURINE YELLOW 01/02/2020 1227   APPEARANCEUR CLEAR 01/02/2020 1227   LABSPEC 1.006 01/02/2020 1227   PHURINE 9.0 (H) 01/02/2020 1227   GLUCOSEU NEGATIVE 01/02/2020 1227   HGBUR NEGATIVE 01/02/2020 [place] 01/02/2020 [place] 01/02/2020 1227   PROTEINUR NEGATIVE 01/02/2020 1227   UROBILINOGEN 0.2 09/10/2015 0940   NITRITE NEGATIVE 01/02/2020 1227   LEUKOCYTESUR NEGATIVE 01/02/2020 1227     Annikah Lovins M.D. [facility] 01/06/2020, 10:28 AM   Call night coverage person covering after 7pm

## 2020-01-06 NOTE — Consult Note (Signed)
Referring Physician: [facility]  Patient name: Anthony Curry. MRN: [id number] DOB: [birth year] Sex: male  REASON FOR CONSULT: Left hand ischemia  HPI: Anthony Curry. is a 79 y.o. male, with a 2-day history of progressive numbness and tingling in the left hand.  He apparently had a left radial A-line recently.  He states that after the radial A-line was removed he developed some numbness and tingling in his left hand.  The numbness and tingling was worse from 48 hours ago but has been fairly constant for the last 24 hours.  He is able to move the hand.  He also has a history of atrial fibrillation and his anticoagulation was discontinued recently for an emergency colon resection.  Patient also has hypercoagulable state with current ongoing Covid as well as metastatic lung cancer.  He currently is on minimal supplemental oxygen and in no respiratory distress.  An upper extremity arterial duplex exam was performed at [facility] long earlier today which showed monophasic flow throughout the entire left arm suggestive of a left subclavian or axillary artery process.  Past Medical History:  Diagnosis Date  . 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 Adrenal mass ([place])   . 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 Anemia of chronic disease   . 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 Anxiety   . BPH (benign prostatic hyperplasia)   . Cancer ([place])    small cell/lung  . Complication of anesthesia 09/08/2015    JITTERY AFTER GALLBLADDER SURGERY  . GERD (gastroesophageal reflux disease)   . History of radiation therapy 11/14/16, 11/18/16, 11/22/16   Left upper lung: 54 Gy in 3 fractions  . Hypertension   . Neoplastic malignant related fatigue    IV tx. every 2 weeks last9-14-16 (Dr. Earlie Server), radiation last tx. 6 months  . 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 Pericardial effusion   . Persistent atrial fibrillation ([place])   . Pulmonary nodules   . Radiation 12/02/14-12/16/14   Left adrenal gland 30 Gy in 10 fractions   Past Surgical History:  Procedure Laterality Date  . ANKLE ARTHRODESIS  1995   rt fx-hardware in  . CARDIOVERSION N/A 09/29/2014   Procedure: CARDIOVERSION;  Surgeon: Josue Hector, MD;  [facility] ENDOSCOPY;  Service: Cardiovascular;  Laterality: N/A;  . CHOLECYSTECTOMY  09/08/2015    laproscopic   . CHOLECYSTECTOMY N/A 09/08/2015   Procedure: LAPAROSCOPIC CHOLECYSTECTOMY WITH ATTEMPTED INTRAOPERATIVE CHOLANGIOGRAM ;  Surgeon: Fanny Skates, MD;  Location: [place];  Service: General;  Laterality: N/A;  . COLONOSCOPY    . ERCP N/A 09/02/2015   Procedure: ENDOSCOPIC RETROGRADE CHOLANGIOPANCREATOGRAPHY (ERCP);  Surgeon: Arta Silence, MD;  Location: [facility] ENDOSCOPY;  Service: Endoscopy;  Laterality: N/A;  . ERCP N/A 09/07/2015   Procedure: ENDOSCOPIC RETROGRADE CHOLANGIOPANCREATOGRAPHY (ERCP);  Surgeon: Clarene Essex, MD;  Location: [facility] ENDOSCOPY;  Service: Endoscopy;  Laterality: N/A;  . EXCISION / CURETTAGE BONE CYST FINGER  2005   rt thumb  . HEMORRHOID SURGERY    . LAPAROTOMY N/A 01/05/2020   Procedure: EXPLORATORY LAPAROTOMY sigmoid colon resection creation end colostomy for perforated diverticulitis and purulent peritonitis;  Surgeon: Kieth Brightly Arta Bruce, MD;  Location: [facility] ORS;  Service: General;  Laterality: N/A;  . Percutaneous Cholecytostomy tube     IVR insertion 06-13-15(Dr. Vernard Gambles)- 08-28-15 remains in place to drainage bag  . TONSILLECTOMY    . TOTAL SHOULDER ARTHROPLASTY  02/03/2012   Procedure: TOTAL SHOULDER ARTHROPLASTY;  Surgeon: [name], MD;  Location:  [place];  Service: Orthopedics;  Laterality: Left;    Family History  Problem Relation Age of Onset  . Lung cancer Mother   . Hypertension Father   . Heart attack Father   . Diabetes Paternal Grandmother   . Brain cancer Brother   . Heart attack Brother   . Neuropathy Neg Hx     SOCIAL HISTORY: Social History   Socioeconomic History  . Marital  status: Married    Spouse name: Shulem Mader  . Number of children: 3  . Years of education: 65  . Highest education level: Not on file  Occupational History  . 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 Smoking status: Former Smoker    Packs/day: 2.00    Years: 30.00    Pack years: 60.00    Types: Cigarettes    Quit date: 12/05/1990    Years since quitting: 29.1  . Smokeless tobacco: Never Used  Substance and Sexual Activity  . Alcohol use: Yes    Alcohol/week: 0.0 standard drinks    Comment: past hx.ETOH abuse,08-31-15 "occ. wine ,drinks non alcoholic beer occ."  . Drug use: No  . Sexual activity: Not Currently  Other Topics Concern  . Not on file  Social History Narrative   Lives at home with wife.   Caffeine use: Drinks coffee- 2 cups per day   Retired from [employer] Strain:   . Difficulty of Paying Living Expenses: Not on file  Food Insecurity:   . Worried About Charity fundraiser in the Last Year: Not on file  . Ran Out of Food in the Last Year: Not on file  Transportation Needs:   . Lack of Transportation (Medical): Not on file  . Lack of Transportation (Non-Medical): Not on file  Physical Activity:   . Days of Exercise per Week: Not on file  . Minutes of Exercise per Session: Not on file  Stress:   . Feeling of Stress : Not on file  Social Connections:   . Frequency of Communication with Friends and Family: Not on file  . Frequency of Social Gatherings with Friends and Family: Not on file  . Attends Religious Services: Not on file  . Active Member of Clubs or Organizations: Not on file  . Attends Archivist Meetings: Not on file  . Marital Status: Not on file  Intimate Partner Violence:   . Fear of Current or Ex-Partner: Not on file  . Emotionally Abused: Not on file  . Physically Abused: Not on file  . Sexually Abused: Not on file    No Known  Allergies  Current Facility-Administered Medications  Medication Dose Route Frequency Provider Last Rate Last Admin  . acetaminophen (TYLENOL) tablet 650 mg  650 mg Oral Q6H PRN Rai, Ripudeep K, MD   650 mg at 01/05/20 0405  . albuterol (VENTOLIN HFA) 108 (90 Base) MCG/ACT inhaler 2 puff  2 puff Inhalation Q4H PRN Rai, Ripudeep K, MD      . ALPRAZolam Duanne Moron) tablet 0.5 mg  0.5 mg Oral QHS PRN Rai, Ripudeep K, MD   0.5 mg at 01/04/20 2239  . ascorbic acid (VITAMIN C) tablet 500 mg  500 mg Oral BID Rai, Ripudeep K, MD   500 mg at 01/06/20 1005  . chlorhexidine (PERIDEX) 0.12 % solution 15 mL  15 mL Mouth Rinse BID Rai, Ripudeep  K, MD   15 mL at 01/06/20 1005  . Chlorhexidine Gluconate Cloth 2 % PADS 6 each  6 each Topical Daily Rai, Ripudeep K, MD   6 each at 01/06/20 1006  . demeclocycline (DECLOMYCIN) tablet 150 mg  150 mg Oral BID Rai, Ripudeep K, MD   150 mg at 01/06/20 1005  . diltiazem (CARDIZEM CD) 24 hr capsule 180 mg  180 mg Oral Q1200 Rai, Ripudeep K, MD   180 mg at 01/06/20 1322  . feeding supplement (BOOST / RESOURCE BREEZE) liquid 1 Container  1 Container Oral TID BM Romana Juniper A, MD      . fluticasone (FLONASE) 50 MCG/ACT nasal spray 2 spray  2 spray Each Nare QHS Rai, Ripudeep K, MD   2 spray at 01/04/20 2229  . heparin injection 5,000 Units  5,000 Units Subcutaneous Q8H Minda Ditto, RPH   5,000 Units at 01/06/20 8101  . hydrALAZINE (APRESOLINE) injection 10 mg  10 mg Intravenous Q6H PRN Rai, Ripudeep K, MD      . HYDROmorphone (DILAUDID) injection 1 mg  1 mg Intravenous Q4H PRN Rai, Ripudeep K, MD   1 mg at 01/06/20 1322  . Ipratropium-Albuterol (COMBIVENT) respimat 1 puff  1 puff Inhalation Q6H Rai, Ripudeep K, MD   1 puff at 01/06/20 1325  . levothyroxine (SYNTHROID, LEVOTHROID) injection 25 mcg  25 mcg Intravenous Daily Rai, Ripudeep K, MD   25 mcg at 01/06/20 1005  . MEDLINE mouth rinse  15 mL Mouth Rinse q12n4p Rai, Ripudeep K, MD   15 mL at 01/04/20 1721  .  mirtazapine (REMERON) tablet 30 mg  30 mg Oral QHS Rai, Ripudeep K, MD   30 mg at 01/04/20 2228  . mometasone-formoterol (DULERA) 100-5 MCG/ACT inhaler 2 puff  2 puff Inhalation BID Rai, Ripudeep K, MD   2 puff at 01/06/20 0802  . ondansetron (ZOFRAN) injection 4 mg  4 mg Intravenous Q6H PRN Rai, Ripudeep K, MD      . oxyCODONE (Oxy IR/ROXICODONE) immediate release tablet 5 mg  5 mg Oral Q6H PRN Kinsinger, Arta Bruce, MD   5 mg at 01/06/20 1005  . piperacillin-tazobactam (ZOSYN) IVPB 3.375 g  3.375 g Intravenous Q8H Rai, Ripudeep K, MD 12.5 mL/hr at 01/06/20 1324 3.375 g at 01/06/20 1324  . remdesivir 100 mg in sodium chloride 0.9 % 100 mL IVPB  100 mg Intravenous Daily Rai, Ripudeep K, MD   Stopped at 01/06/20 1052  . sodium chloride 0.9 % bolus 1,000 mL  1,000 mL Intravenous Once Neila Gear, NP   Stopped at 01/05/20 2120  . tamsulosin (FLOMAX) capsule 0.4 mg  0.4 mg Oral BID Rai, Ripudeep K, MD   0.4 mg at 01/06/20 1005  . thiamine tablet 100 mg  100 mg Oral Daily Rai, Ripudeep K, MD   100 mg at 01/06/20 1005  . zinc sulfate capsule 220 mg  220 mg Oral Daily Rai, Ripudeep K, MD   220 mg at 01/06/20 1005    ROS:   Cardiac: No recent episodes of chest pain/pressure, no shortness of breath at rest.  + shortness of breath with exertion.  + history of atrial fibrillation or irregular heartbeat  Vascular: No history of rest pain in feet.  No history of claudication.  No history of non-healing ulcer, No history of DVT   Pulmonary: No home oxygen, no productive cough, no hemoptysis,  No asthma or wheezing  Musculoskeletal:  [ ]  Arthritis, [ ]  Low back  pain,  [ ]  Joint pain  Hematologic:No history of hypercoagulable state.  No history of easy bleeding.  No history of anemia  Gastrointestinal: No hematochezia or melena,  No gastroesophageal reflux, no trouble swallowing  Urinary: [ ]  chronic Kidney disease, [ ]  on HD - [ ]  MWF or [ ]  TTHS, [ ]  Burning with urination, [ ]  Frequent urination, [  ] Difficulty urinating;   Skin: No rashes  Psychological: No history of anxiety,  No history of depression   Physical Examination  Vitals:   01/06/20 1100 01/06/20 1200 01/06/20 1300 01/06/20 1424  BP: (!) 147/57 (!) 165/63 (!) 150/54 (!) 166/73  Pulse: 70 81 70   Resp: 13 15 18 19   Temp:    99.4 F (37.4 C)  TempSrc:    Oral  SpO2: 98% 100% 97% 99%  Weight:      Height:        Body mass index is 20.22 kg/m.  General:  Alert and oriented, no acute distress HEENT: Normal Neck: No JVD Cardiac: Regular Rate and Rhythm without murmur Abdomen: Soft, non-tender, incisional tenderness  skin: No rash, left hand with more pallor compared to right slightly cooler Extremity Pulses:  2+ radial, brachial right side absent left brachial radial axillary pulse, 2+ femoral femoral, dorsalis pedis pulses bilaterally Musculoskeletal: No deformity or edema  Neurologic: Upper and lower extremity motor 5/5 and symmetric  DATA:  I reviewed the patient's duplex exam findings as per history of present illness.  I also reviewed a previous chest CT which showed some calcification of the proximal left subclavian artery but probably only about a 50% stenosis.  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  I do not believe this is all necessarily due to a left radial A-line and is more likely due to his atrial fibrillation or his hypercoagulable state.   PLAN: Left brachial embolectomy possible left upper extremity arteriogram possible left subclavian stent emergently  Patient will need to be heparinized intraoperatively and will also need postoperative heparin drip.  Hopefully will not have significant increase in his ongoing hematuria.  Risk benefits possible complications and procedure details include but not limited to bleeding infection recurrent thrombosis were discussed with the patient.  He understands and agrees to proceed.  The  patient has been n.p.o. other than a popsicle that he ate approximately 10 minutes ago.   Ruta Hinds, MD [facility] Office: [phone] Pager: [phone]

## 2020-01-06 NOTE — Progress Notes (Signed)
PT Cancellation Note  Patient Details Name: Anthony Curry. MRN: [id number] DOB: [birth year]   Cancelled Treatment:    Reason Eval/Treat Not Completed: Patient at procedure or test/unavailable  Patient currently off the unit (in surgery per RN). Will proceed with PT evaluation 01/07/20 if appropriate.   Arby Barrette, PT Pager [phone]  Rexanne Mano 01/06/2020, 5:08 PM

## 2020-01-06 NOTE — Progress Notes (Signed)
Patient is NPO. Night time medications were not given. NP aware.

## 2020-01-06 NOTE — Transfer of Care (Signed)
Immediate Anesthesia Transfer of Care Note  Patient: Anthony Curry.  Procedure(s) Performed: EMBOLECTOMY, left brachiel, possible left upper extremity arteriogram, possible left subclavian stent (Left )  Patient Location: recovered in rm 16 by pacu rn  Anesthesia Type:MAC  Level of Consciousness: awake, alert , oriented and patient cooperative  Airway & Oxygen Therapy: Patient Spontanous Breathing and Patient connected to face mask oxygen  Post-op Assessment: Report given to RN and Post -op Vital signs reviewed and stable  Post vital signs: Reviewed and stable  Last Vitals:  Vitals Value Taken Time  BP    Temp    Pulse    Resp    SpO2      Last Pain:  Vitals:   01/06/20 1424  TempSrc: Oral  PainSc: 0-No pain      Patients Stated Pain Goal: 3 (61/68/37 2902)  Complications: No apparent anesthesia complications

## 2020-01-06 NOTE — Progress Notes (Addendum)
[facility]/Trauma Progress Note  1 Day Post-Op   Assessment/Plan  Acute hypoxic respiratory failure due to acute COVID-19 viral pneumonia History of stage IV non-small cell lung CA - undergoing chemo treatment, on hold  Paroxysmal atrial fibrillation - eliquis on hold, last dose 01/31 am  Mild protein calorie malnutrition - albumin 2.3  Acute diverticulitis with perforation - S/P exploratory laparotomy, sigmoid resection with end colostomy creation, JP drain, Dr. Kieth Brightly, 01/31 - no N or V overnight. Okay to start CLD - scant stool in ostomy, no gas - ambulate, IS L hand pale - removed pressure dressing from A line removal. Asked nurse to check pulses with dopplers and if issues call vascular. Asked her to inform primary  FEN: CLD VTE: SCD's, heparin ID: Zosyn 01/28>>  Remdesivir 01/31>> Foley: yes, DC POD#2 Follow up: Dr. Kieth Brightly   Plan: okay to transfer to [place] floor from our standpoint.     LOS: 4 days    Subjective: CC: abdominal pain  Pain well controlled. Just had pain medicine. No N or V overnight. He is complaining that his left hand is cold. Nurse took out A line last night.   Objective: Vital signs in last 24 hours: Temp:  [97.7 F (36.5 C)-98.8 F (37.1 C)] 98.6 F (37 C) (02/01 0800) Pulse Rate:  [61-96] 72 (02/01 0800) Resp:  [13-31] 14 (02/01 0800) BP: (119-166)/(44-62) 166/49 (02/01 0800) SpO2:  [80 %-99 %] 98 % (02/01 0800) Arterial Line BP: (22-159)/(19-48) 22/19 (01/31 2045) Last BM Date: 01/05/20  Intake/Output from previous day: 01/31 0701 - 02/01 0700 In: 3784 [P.O.:990; I.V.:1750; IV Piggyback:1044] Out: 1695 [Urine:1275; Drains:320; Blood:100] Intake/Output this shift: No intake/output data recorded.  PE: Gen:  Alert, NAD, pleasant, cooperative Pulm:  Rate and effort normal, RA Abd: Soft, ND, midline is clean and repacked. Stoma is pink. Ostomy with scant liquid stool in bag, no gas. TTP left hemiabdomen. JP drain  was sanguinous.  Extremities: L hand is more pale and cool than right, less cap refill than R hand. Skin: no rashes noted, warm and dry   Anti-infectives: Anti-infectives (From admission, onward)   Start     Dose/Rate Route Frequency Ordered Stop   01/06/20 1000  remdesivir 100 mg in sodium chloride 0.9 % 100 mL IVPB     100 mg 200 mL/hr over 30 Minutes Intravenous Daily 01/05/20 0846 01/10/20 0959   01/05/20 1000  remdesivir 200 mg in sodium chloride 0.9% 250 mL IVPB     200 mg 580 mL/hr over 30 Minutes Intravenous Once 01/05/20 0846 01/05/20 1002   01/02/20 2000  piperacillin-tazobactam (ZOSYN) IVPB 3.375 g     3.375 g 12.5 mL/hr over 240 Minutes Intravenous Every 8 hours 01/02/20 1415     01/02/20 1500  demeclocycline (DECLOMYCIN) tablet 150 mg    Note to Pharmacy: Take 2 tablets twice a day     150 mg Oral 2 times daily 01/02/20 1408     01/02/20 1345  piperacillin-tazobactam (ZOSYN) IVPB 3.375 g     3.375 g 100 mL/hr over 30 Minutes Intravenous  Once 01/02/20 1331 01/02/20 1528      Lab Results:  Recent Labs    01/05/20 2035 01/06/20 0540  WBC 14.9* 13.8*  HGB 12.0* 12.1*  HCT 34.7* 35.6*  PLT 227 220   BMET Recent Labs    01/05/20 0340 01/06/20 0540  NA 130* 132*  K 3.3* 4.1  CL 101 102  CO2 21* 21*  GLUCOSE 83 148*  BUN 18 20  CREATININE 0.72 0.73  CALCIUM 7.7* 8.1*   PT/INR No results for input(s): LABPROT, INR in the last 72 hours. CMP     Component Value Date/Time   NA 132 (L) 01/06/2020 0540   NA 132 (L) 12/06/2017 0930   K 4.1 01/06/2020 0540   K 4.0 12/06/2017 0930   CL 102 01/06/2020 0540   CO2 21 (L) 01/06/2020 0540   CO2 26 12/06/2017 0930   GLUCOSE 148 (H) 01/06/2020 0540   GLUCOSE 89 12/06/2017 0930   BUN 20 01/06/2020 0540   BUN 15.4 12/06/2017 0930   CREATININE 0.73 01/06/2020 0540   CREATININE 0.97 12/02/2019 1134   CREATININE 0.9 12/06/2017 0930   CALCIUM 8.1 (L) 01/06/2020 0540   CALCIUM 9.9 12/06/2017 0930   PROT 4.9 (L)  01/05/2020 0340   PROT 6.3 (L) 12/06/2017 0930   ALBUMIN 2.3 (L) 01/05/2020 0340   ALBUMIN 3.7 12/06/2017 0930   AST 21 01/05/2020 0340   AST 17 12/02/2019 1134   AST 20 12/06/2017 0930   ALT 16 01/05/2020 0340   ALT 11 12/02/2019 1134   ALT 14 12/06/2017 0930   ALKPHOS 64 01/05/2020 0340   ALKPHOS 107 12/06/2017 0930   BILITOT 1.0 01/05/2020 0340   BILITOT 0.6 12/02/2019 1134   BILITOT 0.76 12/06/2017 0930   GFRNONAA >60 01/06/2020 0540   GFRNONAA >60 12/02/2019 1134   GFRAA >60 01/06/2020 0540   GFRAA >60 12/02/2019 1134   Lipase     Component Value Date/Time   LIPASE 19 01/02/2020 1041    Studies/Results: DG Chest 2 View  Result Date: 01/05/2020 CLINICAL DATA:  Patient with stage IV lung cancer. Left lower quadrant abdominal pain. Diarrhea. EXAM: CHEST - 2 VIEW COMPARISON:  CT scan from January 05, 2020 at 11:39 a.m. FINDINGS: Free air seen under the diaphragm, also appreciated on the CT scan from earlier today. Healed right rib fractures. Mild infiltrate in the medial right lung base, more prominent since January 02, 2020. Pleural thickening versus small left pleural effusion. Atelectasis or scar in the left base. The heart, hila, mediastinum, lungs, and pleura are otherwise unchanged. IMPRESSION: 1. Free air under the diaphragm. This finding was seen on the CT scan from earlier today and called to Dr. Tana Coast by Dr. Jobe Igo at 12:30 p.m. 2. Mild opacity in the right base is likely atelectasis based on CT imaging from earlier today. 3. No other changes. Electronically Signed   By: Dorise Bullion III M.D   On: 01/05/2020 12:43   CT ABDOMEN PELVIS W CONTRAST  Result Date: 01/05/2020 CLINICAL DATA:  Nausea and vomiting. Acute diverticulitis. Worsening pain. Fevers. EXAM: CT ABDOMEN AND PELVIS WITH CONTRAST TECHNIQUE: Multidetector CT imaging of the abdomen and pelvis was performed using the standard protocol following bolus administration of intravenous contrast. CONTRAST:  136mL  OMNIPAQUE IOHEXOL 300 MG/ML  SOLN COMPARISON:  01/02/2020 FINDINGS: Lower chest: Bibasilar atelectasis. Normal heart size with small chronic pericardial effusion. Small bilateral pleural effusions, new. Multivessel coronary artery atherosclerosis. Hepatobiliary: Scattered well-circumscribed low-density liver lesions are likely cysts. Normal gallbladder, without biliary ductal dilatation. Pancreas: Normal, without mass or ductal dilatation. Spleen: Normal in size, without focal abnormality. Adrenals/Urinary Tract: Normal left adrenal gland. Mild right adrenal thickening. Right renal cortical thinning. No hydronephrosis. Decompressed urinary bladder around a Foley catheter. Stomach/Bowel: Normal stomach, without wall thickening. Scattered colonic diverticula. Increased wall thickening involving the proximal sigmoid, including on 67/2. Mid small bowel loops measure up to 2.4 cm. Pelvic  small bowel loops are mildly thickened, including on 73/2. Vascular/Lymphatic: Advanced aortic and branch vessel atherosclerosis. No abdominopelvic adenopathy. Reproductive: Normal prostate. Other: New small volume cul-de-sac fluid, including on 71/2. Extensive, moderate volume free intraperitoneal air, including within the right upper quadrant on 27/2 and adjacent the right lobe of the liver on 29/2. Multiple small locules of pelvic free air, including on 61/2. Enhancing small volume fluid within the anterior left pelvis on 66/2 and up to 3.8 x 1.5 cm just inferior to this on 71/2. Musculoskeletal: Bilateral remote rib fractures. Lumbosacral spondylosis. IMPRESSION: 1. Moderate free intraperitoneal air, likely due to complicated sigmoid diverticulitis. Small volume pericolonic fluid and enhancement, suspicious for infected ascites. 2. Borderline small bowel dilatation, suggesting adynamic ileus. Small bowel wall thickening within the pelvis is likely due to secondary enteritis. 3. New small bilateral pleural effusions with similar  pericardial effusion. 4. Coronary artery atherosclerosis. Aortic Atherosclerosis (ICD10-I70.0). Critical test results telephoned to Dr. Tana Coast. At the time of interpretation at 12:30 p.m.On 01/05/2020. Electronically Signed   By: Abigail Miyamoto M.D.   On: 01/05/2020 12:32     Kalman Drape, [facility] Surgery Please see amion for pager for the following: Cristine Polio, & Friday 7:00am - 4:30pm Thursdays 7:00am -11:30am

## 2020-01-06 NOTE — Progress Notes (Signed)
Pt discussed with my PA Matt Eveland.  Duplex shows patent Radial Ulnar arteries.  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  Will transfer to [facility] for further eval.    Matt discussed with the hospitalist service.  Ruta Hinds, MD [facility] Office: [phone]

## 2020-01-06 NOTE — Op Note (Signed)
Procedure: Left subclavian axillary brachial radial ulnar embolectomy  Preoperative diagnosis: Acute ischemia left hand  Operative diagnosis: Same  Anesthesia: Local with IV sedation  Assistant: Gerri Lins, PA-C  Specimens: Embolic material from left subclavian axillary/left radial artery  Operative findings: Embolic material left axillary subclavian radial artery suspect cardiac source  Operative details: After team informed consent, the patient was taken the operating.  Was placed in supine position operating table.  After adequate sedation patient's entire left upper extremities prepped and draped in usual sterile fashion.  Local anesthesia was infiltrated near the antecubital crease over the area of the brachial artery.  Longitudinal incision was made in this location could on through subcutaneous tissues on the left brachial artery.  There was no pulse within it.  It was soft in character.  It was dissected free circumferentially.  Dissection was then carried down to the takeoff of the left radial and ulnar arteries and Vesseloops were placed around these.  Vesseloops also placed around the brachial artery.  Patient was given 7000 units of intravenous heparin.  After appropriate circulation time, transverse arteriotomy was made in the brachial artery just above the brachial bifurcation.  #4 Fogarty catheter was used to thrombectomized in the proximal portion of the artery passing the Fogarty up to the level of the proximal subclavian.  Large amount of embolic material was retrieved and there was brisk arterial inflow at this point.  2 - passes were achieved.  I then passed a #330 catheter down the radial artery a large amount of embolic material was returned.  Again this was passed on the radial artery multiple passes until 2 clean passes were obtained.  This was thoroughly flushed with heparinized saline.  #3 Fogarty catheter was then used to embolectomized the ulnar artery.  2 clean passes  were also obtained from this.  Then was thoroughly flushed heparinized saline.  I then reapproximated the arteriotomy in the brachial artery using a running 6-0 Prolene suture.  Flow was then first restored from the brachial artery to the ulnar artery and finally to the radial artery.  There was a palpable radial pulse at this point.  There was good pulse in the brachial artery.  Hemostasis was obtained.  Subcutaneous tissues were reapproximated using running 3-0 Vicryl suture.  Skin was closed with a 4-0 Vicryl subcuticular stitch.  The patient will be started on a heparin drip in the recovery room and will need to be fully heparinized at this point as he has now had an embolic event most likely cardiac source.  He will need to be restarted on his Eliquis at some point in the future we will leave this at the discretion of the primary service since there are other multiple services involved.  Ruta Hinds, MD [facility] Office: [phone]

## 2020-01-06 NOTE — Consult Note (Signed)
I have been asked to see the patient by Dr. Tana Coast, for evaluation and management of gross hematuria.  History of present illness: 79 year old man with multiple medical problems including stage IV non-small cell lung cancer undergoing chemotherapy also with acute hypoxic respiratory failure due to COVID-19, A. fib on Eliquis who presented to the ER with left lower quadrant abdominal pain found to have acute perforated diverticulitis now POD1 s/p ex-lap, sigmoid resection and end colostomy.  Urology was consulted for gross hematuria noted in the Foley catheter on postop day 0.  Patient is tired during the interaction and very difficult to hear in the room due to the airflow for the Covid positive room and PPE.  He said he has never had hematuria before.  He denies any issues with his Foley catheter.   Review of systems: A 12 point comprehensive review of systems was obtained and is negative unless otherwise stated in the history of present illness.  Patient Active Problem List   Diagnosis Date Noted  . Diverticulitis 01/02/2020  . Acute diverticulitis 01/02/2020  . Ataxia 06/30/2019  . 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 Generalized weakness 06/28/2019  . Lactic acidosis 06/28/2019  . Abnormal urinalysis 06/28/2019  . Hypertension 12/07/2016  . 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 Acute urinary retention 09/09/2015  . Choledocholithiasis with cholecystitis 09/08/2015  . Common bile duct stone   . Encounter for antineoplastic immunotherapy 06/28/2015  . Atrial fibrillation ([place]) 06/11/2015  . Gynecomastia, male 06/10/2015  . Dizziness 05/11/2015  . Malignant neoplasm of lower lobe of right lung ([place]) 09/17/2014  . Neutropenic fever ([place]) 09/02/2014  . Neoplastic malignant related fatigue 08/13/2014  . Physical deconditioning 08/13/2014  . Hypoalbuminemia 08/13/2014  . Diarrhea 08/13/2014  . Antineoplastic chemotherapy induced anemia(285.3)  08/13/2014  . Atrial fibrillation with RVR ([place]) 07/28/2014  . HCAP (healthcare-associated pneumonia) 07/25/2014  . Alcoholism in remission ([place]) 07/25/2014  . Atrial flutter by electrocardiogram ([place]) 07/25/2014  . Swelling of limb 06/10/2014  . Lung cancer ([place]) 10/15/2013  . Hypotension 09/10/2013  . Adrenal mass ([place]) 09/09/2013  . 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 Pulmonary nodules 08/19/2013  . Anemia of chronic disease 07/03/2013  . BPH (benign prostatic hyperplasia) 07/03/2013  . GERD (gastroesophageal reflux disease) 07/03/2013  . Fall 07/03/2013  . Alcohol intoxication ([place]) 07/03/2013  . 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 Alcohol abuse 10/15/2011  . Hyponatremia 10/15/2011    No current facility-administered medications on file prior to encounter.   Current Outpatient Medications on File Prior to Encounter  Medication Sig Dispense Refill  . ALPRAZolam (XANAX) 0.5 MG tablet Take 0.5 mg by mouth at bedtime as needed.    [name] apixaban (ELIQUIS) 5 MG TABS tablet Take 5 mg by mouth 2 (two) times daily.    . Ascorbic Acid (VITAMIN C PO) Take 1,000 mg by mouth daily.     . B Complex-C (SUPER B COMPLEX PO) Take 1 each by mouth daily.     . calcium carbonate (OS-CAL) 600 MG TABS Take 600 mg by mouth daily with breakfast.    . demeclocycline (DECLOMYCIN) 150 MG tablet Take 2 tablets twice a day (Patient taking differently: Take 150 mg by mouth 2 (two) times daily. ) 120 tablet 2  . diltiazem (CARDIZEM CD) 180 MG 24 hr capsule Take 1 capsule (180 mg total) by mouth daily at 12 noon. 90 capsule 3  . doxycycline (VIBRAMYCIN) 100 MG capsule Take 100 mg by mouth 2 (two) times  daily.    . fluticasone (FLONASE) 50 MCG/ACT nasal spray Place 2 sprays into both nostrils at bedtime.     [name] HYDROcodone-acetaminophen (NORCO/VICODIN) 5-325 MG tablet Take 1 tablet by mouth 3 (three) times daily as needed for moderate pain. 60 tablet 0  . KLOR-CON M20 20 MEQ tablet Take 20 mEq by  mouth every morning.     . Lactobacillus (ACIDOPHILUS) CAPS capsule Take 1 capsule by mouth daily.    [name] levothyroxine (SYNTHROID, LEVOTHROID) 50 MCG tablet TAKE 1 TABLET (50 MCG TOTAL) BY MOUTH DAILY BEFORE BREAKFAST. 90 tablet 0  . MAGNESIUM PO Take 400 mg by mouth daily.     . mirtazapine (REMERON) 30 MG tablet TAKE 1 TABLET(30 MG) BY MOUTH AT BEDTIME (Patient taking differently: Take 30 mg by mouth at bedtime. ) 30 tablet 2  . Multiple Vitamin (MULTIVITAMIN WITH MINERALS) TABS Take 1 tablet by mouth daily.    . Nivolumab (OPDIVO IV) Inject as directed every 30 (thirty) days. Inject as directed every 2 weeks    . Nutritional Supplements (ENSURE COMPLETE PO) Take 1 Can by mouth 2 (two) times daily.    . Omega-3 Fatty Acids (FISH OIL PO) Take 1 tablet by mouth daily.    . tamsulosin (FLOMAX) 0.4 MG CAPS capsule Take 0.4 mg by mouth daily at 12 noon.     . thiamine (VITAMIN B-1) 100 MG tablet Take 1 tablet (100 mg total) by mouth daily. 30 tablet 6  . vitamin E 400 UNIT capsule Take 400 Units by mouth daily.      Past Medical History:  Diagnosis Date  . 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 Adrenal mass ([place])   . Alcoholism in remission ([place])   . Anemia of chronic disease   . 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 Anxiety   . BPH (benign prostatic hyperplasia)   . Cancer ([place])    small cell/lung  . Complication of anesthesia 09/08/2015    JITTERY AFTER GALLBLADDER SURGERY  . GERD (gastroesophageal reflux disease)   . History of radiation therapy 11/14/16, 11/18/16, 11/22/16   Left upper lung: 54 Gy in 3 fractions  . Hypertension   . Neoplastic malignant related fatigue    IV tx. every 2 weeks last9-14-16 (Dr. Earlie Server), radiation last tx. 6 months  . 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 Pericardial effusion   . Persistent atrial fibrillation ([place])   . Pulmonary nodules   . Radiation 12/02/14-12/16/14   Left adrenal gland 30 Gy in 10 fractions    Past Surgical  History:  Procedure Laterality Date  . ANKLE ARTHRODESIS  1995   rt fx-hardware in  . CARDIOVERSION N/A 09/29/2014   Procedure: CARDIOVERSION;  Surgeon: Josue Hector, MD;  Location: [facility]COPY;  Service: Cardiovascular;  Laterality: N/A;  . CHOLECYSTECTOMY  09/08/2015    laproscopic   . CHOLECYSTECTOMY N/A 09/08/2015   Procedure: LAPAROSCOPIC CHOLECYSTECTOMY WITH ATTEMPTED INTRAOPERATIVE CHOLANGIOGRAM ;  Surgeon: Fanny Skates, MD;  Location: [facility];  Service: General;  Laterality: N/A;  . COLONOSCOPY    . ERCP N/A 09/02/2015   Procedure: ENDOSCOPIC RETROGRADE CHOLANGIOPANCREATOGRAPHY (ERCP);  Surgeon: Arta Silence, MD;  Location: [facility];  Service: Endoscopy;  Laterality: N/A;  . ERCP N/A 09/07/2015   Procedure: ENDOSCOPIC RETROGRADE CHOLANGIOPANCREATOGRAPHY (ERCP);  Surgeon: Clarene Essex, MD;  Location: [facility];  Service: Endoscopy;  Laterality: N/A;  . EXCISION / CURETTAGE BONE CYST FINGER  2005   rt thumb  . HEMORRHOID SURGERY    .  LAPAROTOMY N/A 01/05/2020   Procedure: EXPLORATORY LAPAROTOMY sigmoid colon resection creation end colostomy for perforated diverticulitis and purulent peritonitis;  Surgeon: Kieth Brightly Arta Bruce, MD;  Location: [facility];  Service: General;  Laterality: N/A;  . Percutaneous Cholecytostomy tube     IVR insertion 06-13-15(Dr. Vernard Gambles)- 08-28-15 remains in place to drainage bag  . TONSILLECTOMY    . TOTAL SHOULDER ARTHROPLASTY  02/03/2012   Procedure: TOTAL SHOULDER ARTHROPLASTY;  Surgeon: [name], MD;  Location: [facility];  Service: Orthopedics;  Laterality: Left;    Social History   Tobacco Use  . Smoking status: Former Smoker    Packs/day: 2.00    Years: 30.00    Pack years: 60.00    Types: Cigarettes    Quit date: 12/05/1990    Years since quitting: 29.1  . Smokeless tobacco: Never Used  Substance Use Topics  . Alcohol use: Yes    Alcohol/week: 0.0 standard drinks    Comment: past hx.ETOH abuse,08-31-15 "occ. wine  ,drinks non alcoholic beer occ."  . Drug use: No    Family History  Problem Relation Age of Onset  . Lung cancer Mother   . Hypertension Father   . Heart attack Father   . Diabetes Paternal Grandmother   . Brain cancer Brother   . Heart attack Brother   . Neuropathy Neg Hx     PE: Vitals:   01/06/20 1000 01/06/20 1100 01/06/20 1200 01/06/20 1300  BP: (!) 158/51 (!) 147/57 (!) 165/63 (!) 150/54  Pulse: 72 70 81 70  Resp: 16 13 15 18   Temp:      TempSrc:      SpO2: 98% 98% 100% 97%  Weight:      Height:       Patient appears to be in no acute distress  patient is alert and oriented x3 Atraumatic normocephalic head No cervical or supraclavicular lymphadenopathy appreciated No increased work of breathing Regular sinus rhythm/rate Abdomen is soft, appropriately tender to palpation, no suprapubic tenderness GU: 16 French Foley draining clear yellow urine, phallus is circumcised Lower extremities are symmetric Grossly neurologically intact No identifiable skin lesions  Recent Labs    01/05/20 0340 01/05/20 2035 01/06/20 0540  WBC 12.8* 14.9* 13.8*  HGB 13.4 12.0* 12.1*  HCT 39.2 34.7* 35.6*   Recent Labs    01/04/20 0225 01/05/20 0340 01/06/20 0540  NA 131* 130* 132*  K 3.7 3.3* 4.1  CL 100 101 102  CO2 21* 21* 21*  GLUCOSE 107* 83 148*  BUN 20 18 20   CREATININE 0.81 0.72 0.73  CALCIUM 8.3* 7.7* 8.1*   No results for input(s): LABPT, INR in the last 72 hours. No results for input(s): LABURIN in the last 72 hours. Results for orders placed or performed during the hospital encounter of 01/02/20  SARS CORONAVIRUS 2 (TAT 6-24 HRS) Nasopharyngeal Nasopharyngeal Swab     Status: Abnormal   Collection Time: 01/02/20  2:29 PM   Specimen: Nasopharyngeal Swab  Result Value Ref Range Status   SARS Coronavirus 2 POSITIVE (A) NEGATIVE Final    Comment: RESULT CALLED TO, READ BACK BY AND VERIFIED WITH: T BAKER,RN 2015 01/02/2020 D BRADLEY (NOTE) SARS-CoV-2 target  nucleic acids are DETECTED. The SARS-CoV-2 RNA is generally detectable in upper and lower respiratory specimens during the acute phase of infection. Positive results are indicative of the presence of SARS-CoV-2 RNA. Clinical correlation with patient history and other diagnostic information is  necessary to determine patient infection status. Positive  results do not rule out bacterial infection or co-infection with other viruses.  The expected result is Negative. Fact Sheet for Patients: SugarRoll.be Fact Sheet for Healthcare Providers: https://www.woods-mathews.com/ This test is not yet approved or cleared by the [place] FDA and  has been authorized for detection and/or diagnosis of SARS-CoV-2 by FDA under an Emergency Use Authorization (EUA). This EUA will remain  in effect (meaning this test can be used) for the  duration of the COVID-19 declaration under Section 564(b)(1) of the Act, 21 U.S.C. section 360bbb-3(b)(1), unless the authorization is terminated or revoked sooner. Performed at [facility], [place] [address] [place], [place] [address]   Culture, blood (routine x 2)     Status: None (Preliminary result)   Collection Time: 01/03/20 10:10 PM   Specimen: BLOOD  Result Value Ref Range Status   Specimen Description   Final    BLOOD RIGHT ANTECUBITAL Performed at [facility], [place] [address] [place], [place] [address]    Special Requests   Final    BOTTLES DRAWN AEROBIC AND ANAEROBIC Blood Culture adequate volume Performed at [facility] [address] [place], [place] [address]    Culture   Final    NO GROWTH 3 DAYS Performed at [facility], [place] [address] [place], [place] [address]    Report Status PENDING  Incomplete  Culture, blood (routine x 2)     Status: None (Preliminary result)   Collection Time: 01/03/20 10:10 PM   Specimen: BLOOD  Result Value Ref Range Status   Specimen  Description   Final    BLOOD LEFT HAND Performed at [facility] [address], [place], [place] [address]    Special Requests   Final    BOTTLES DRAWN AEROBIC AND ANAEROBIC Blood Culture adequate volume Performed at [facility] [address], [place], [place] [address]    Culture   Final    NO GROWTH 3 DAYS Performed at [facility], [place] [address] [place], [place] [address]    Report Status PENDING  Incomplete  Surgical pcr screen     Status: None   Collection Time: 01/05/20  2:45 PM   Specimen: Nasal Mucosa; Nasal Swab  Result Value Ref Range Status   MRSA, PCR NEGATIVE NEGATIVE Final   Staphylococcus aureus NEGATIVE NEGATIVE Final    Comment: (NOTE) The Xpert SA Assay (FDA approved for NASAL specimens in patients 55 years of age and older), is one component of a comprehensive surveillance program. It is not intended to diagnose infection nor to guide or monitor treatment. Performed at [facility], [place] [address] [place], [place] [address]   Culture, blood (routine x 2)     Status: None (Preliminary result)   Collection Time: 01/06/20  8:15 AM   Specimen: BLOOD RIGHT HAND  Result Value Ref Range Status   Specimen Description   Final    BLOOD RIGHT HAND Performed at [facility] [address], [place], [place] [address]    Special Requests   Final    BOTTLES DRAWN AEROBIC AND ANAEROBIC Blood Culture adequate volume Performed at [facility] [address], [place], [place] [address]    Culture   Final    NO GROWTH < 12 HOURS Performed at [facility] [address], [place],  [address]    Report Status PENDING  Incomplete  Culture, blood (routine  x 2)     Status: None (Preliminary result)   Collection Time: 01/06/20  8:15 AM   Specimen: BLOOD  Result Value Ref Range Status   Specimen Description   Final    BLOOD RIGHT ARM Performed at [facility] [address], [place], [place] [address]    Special Requests   Final    BOTTLES DRAWN AEROBIC AND ANAEROBIC Blood Culture adequate volume Performed at [facility] [address], [place], [place] [address]    Culture   Final    NO GROWTH < 12 HOURS Performed at [facility] [address], [place], [place] [address]    Report Status PENDING  Incomplete   *Note: Due to a large number of results and/or encounters for the requested time period, some results have not been displayed. A complete set of results can be found in Results Review.    Imaging: CT Abd/Pelvis with Contrast 01/05/20 IMPRESSION: 1. Moderate free intraperitoneal air, likely due to complicated sigmoid diverticulitis. Small volume pericolonic fluid and enhancement, suspicious for infected ascites. 2. Borderline small bowel dilatation, suggesting adynamic ileus. Small bowel wall thickening within the pelvis is likely due to secondary enteritis. 3. New small bilateral pleural effusions with similar pericardial effusion. 4. Coronary artery atherosclerosis. Aortic Atherosclerosis (ICD10-I70.0).  Imp: 79 year old man with acute hypoxic respiratory failure due to acute COVID-19 (also with stage IV non-small cell lung cancer), A. fib on Eliquis, and acute diverticulitis with perforation postop day 1 status post ex lap, sigmoid resection and end colostomy creation.  Patient's hematuria has resolved on exam today.  Recommendations: -Imaging was reviewed which did not show any significant kidney abnormalities -Gross hematuria is likely due to blood thinner in setting of Foley catheter -Patient has larger medical problems at the moment that should be addressed prior to a cystoscopy that can be done as an outpatient -Foley catheter may be removed when deemed necessary by primary team. -If patient begins to bleed again while on anticoagulation as long as he is  draining/voiding and not clot retention no acute change to plan  Thank you for involving me in this patient's care.  Please page with any further questions or concerns. Belanna Manring D Jermesha Sottile

## 2020-01-06 NOTE — Progress Notes (Signed)
ANTICOAGULATION CONSULT NOTE - Initial Consult  Pharmacy Consult for Heparin Indication: atrial fibrillation, s/p L subclavian axillary brachial radial ulnar embolectomy  No Known Allergies  Patient Measurements: Height: 5\' 11"  (180.3 cm) Weight: 145 lb (65.8 kg) IBW/kg (Calculated) : 75.3 Heparin Dosing Weight: 65.8 kg  Vital Signs: Temp: 98 F (36.7 C) (02/01 1832) Temp Source: Oral (02/01 1424) BP: 127/50 (02/01 1858) Pulse Rate: 70 (02/01 1300)  Labs: Recent Labs    01/04/20 0225 01/04/20 0225 01/05/20 0340 01/05/20 0340 01/05/20 2035 01/06/20 0540  HGB 14.0   < > 13.4   < > 12.0* 12.1*  HCT 41.5   < > 39.2  --  34.7* 35.6*  PLT 238   < > 242  --  227 220  CREATININE 0.81  --  0.72  --   --  0.73   < > = values in this interval not displayed.    Estimated Creatinine Clearance: 68.5 mL/min (by C-G formula based on SCr of 0.73 mg/dL).   Medical History: Past Medical History:  Diagnosis Date  . 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 Adrenal mass ([place])   . Alcoholism in remission ([place])   . Anemia of chronic disease   . 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 Anxiety   . BPH (benign prostatic hyperplasia)   . Cancer ([place])    small cell/lung  . Complication of anesthesia 09/08/2015    JITTERY AFTER GALLBLADDER SURGERY  . GERD (gastroesophageal reflux disease)   . History of radiation therapy 11/14/16, 11/18/16, 11/22/16   Left upper lung: 54 Gy in 3 fractions  . Hypertension   . Neoplastic malignant related fatigue    IV tx. every 2 weeks last9-14-16 (Dr. Earlie Server), radiation last tx. 6 months  . 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 Pericardial effusion   . Persistent atrial fibrillation ([place])   . Pulmonary nodules   . Radiation 12/02/14-12/16/14   Left adrenal gland 30 Gy in 10 fractions    Medications:  Scheduled:  . vitamin C  500 mg Oral BID  . chlorhexidine  15 mL Mouth Rinse BID  . Chlorhexidine Gluconate Cloth  6 each Topical Daily   . demeclocycline  150 mg Oral BID  . diltiazem  180 mg Oral Q1200  . feeding supplement  1 Container Oral TID BM  . fluticasone  2 spray Each Nare QHS  . Ipratropium-Albuterol  1 puff Inhalation Q6H  . levothyroxine  25 mcg Intravenous Daily  . mouth rinse  15 mL Mouth Rinse q12n4p  . mirtazapine  30 mg Oral QHS  . mometasone-formoterol  2 puff Inhalation BID  . tamsulosin  0.4 mg Oral BID  . thiamine  100 mg Oral Daily  . zinc sulfate  220 mg Oral Daily   Infusions:  . piperacillin-tazobactam (ZOSYN)  IV Stopped (01/06/20 1859)  . remdesivir 100 mg in NS 100 mL Stopped (01/06/20 1052)  . sodium chloride Stopped (01/05/20 2120)    Assessment: 79 yo M presented to [facility] with sudden onset of abd pain starting 1/27.  Pt on apixaban PTA for hx afib.  1/28 admit to [facility] - CT abd showed acute diverticulitis. No abscess or perf. 1/31 worsening pain / fevers - CT abd showed perf.  Apixaban given 0930. 1/31 rec'd Andexxa for urgent surgery - ex lap, sigmoid resection and colostomy. 2/1 Transfer to [facility] for [facility] eval.  Pt reported numbness/tingling in L hand associated with A line insertion.  OR for left subclavian axillary  brachial radial ulnar embolectomy.  Plan for treatment dose heparin given patient experiences an embolic event while off apixaban.  Last dose of apixaban 1/31 at 0930.  Goal of Therapy:  Heparin level 0.3-0.7 units/ml  aPTT goal 66-102 Monitor platelets by anticoagulation protocol: Yes   Plan:  Start heparin at 900 units/hr. Heparin level/aPTT in 8 hours. Daily heparin level, aPTT, and CBC while on heparin.  [employer], Pharm.D., BCPS Clinical Pharmacist  01/06/2020 8:07 PM

## 2020-01-07 ENCOUNTER — Encounter: Payer: Self-pay | Admitting: *Deleted

## 2020-01-07 DIAGNOSIS — E871 Hypo-osmolality and hyponatremia: Secondary | ICD-10-CM

## 2020-01-07 DIAGNOSIS — U071 COVID-19: Secondary | ICD-10-CM

## 2020-01-07 LAB — BASIC METABOLIC PANEL
Anion gap: 6 (ref 5–15)
BUN: 27 mg/dL — ABNORMAL HIGH (ref 8–23)
CO2: 24 mmol/L (ref 22–32)
Calcium: 8 mg/dL — ABNORMAL LOW (ref 8.9–10.3)
Chloride: 104 mmol/L (ref 98–111)
Creatinine, Ser: 0.79 mg/dL (ref 0.61–1.24)
Glucose, Bld: 109 mg/dL — ABNORMAL HIGH (ref 70–99)
Potassium: 3.8 mmol/L (ref 3.5–5.1)
Sodium: 134 mmol/L — ABNORMAL LOW (ref 135–145)

## 2020-01-07 LAB — CBC
HCT: 32.4 % — ABNORMAL LOW (ref 39.0–52.0)
Hemoglobin: 11.2 g/dL — ABNORMAL LOW (ref 13.0–17.0)
MCH: 30.9 pg (ref 26.0–34.0)
MCHC: 34.6 g/dL (ref 30.0–36.0)
MCV: 89.3 fL (ref 80.0–100.0)
Platelets: 273 10*3/uL (ref 150–400)
RBC: 3.63 MIL/uL — ABNORMAL LOW (ref 4.22–5.81)
RDW: 14.2 % (ref 11.5–15.5)
WBC: 13.4 10*3/uL — ABNORMAL HIGH (ref 4.0–10.5)
nRBC: 0 % (ref 0.0–0.2)

## 2020-01-07 LAB — SURGICAL PATHOLOGY

## 2020-01-07 LAB — APTT: aPTT: 78 seconds — ABNORMAL HIGH (ref 24–36)

## 2020-01-07 LAB — HEPARIN LEVEL (UNFRACTIONATED): Heparin Unfractionated: 1.03 IU/mL — ABNORMAL HIGH (ref 0.30–0.70)

## 2020-01-07 LAB — C-REACTIVE PROTEIN: CRP: 16.8 mg/dL — ABNORMAL HIGH (ref <1.0)

## 2020-01-07 LAB — FERRITIN: Ferritin: 348 ng/mL — ABNORMAL HIGH (ref 24–336)

## 2020-01-07 LAB — D-DIMER, QUANTITATIVE: D-Dimer, Quant: 2.55 ug/mL-FEU — ABNORMAL HIGH (ref 0.00–0.50)

## 2020-01-07 MED ORDER — HEPARIN (PORCINE) 25000 UT/250ML-% IV SOLN
900.0000 [IU]/h | INTRAVENOUS | Status: DC
  Administered 2020-01-07 – 2020-01-09 (×3): 900 [IU]/h via INTRAVENOUS
  Filled 2020-01-07 (×2): qty 250

## 2020-01-07 MED ORDER — HEPARIN (PORCINE) 25000 UT/250ML-% IV SOLN: 800.0000 [IU]/h | INTRAVENOUS | Status: DC

## 2020-01-07 MED ORDER — PRO-STAT SUGAR FREE PO LIQD
30.0000 mL | Freq: Two times a day (BID) | ORAL | Status: DC
  Administered 2020-01-07 – 2020-01-10 (×6): 30 mL via ORAL
  Filled 2020-01-07 (×6): qty 30

## 2020-01-07 MED ORDER — HYDROMORPHONE HCL 1 MG/ML IJ SOLN
1.0000 mg | INTRAMUSCULAR | Status: DC | PRN
  Administered 2020-01-07 – 2020-01-10 (×8): 1 mg via INTRAVENOUS
  Filled 2020-01-07 (×8): qty 1

## 2020-01-07 NOTE — Consult Note (Signed)
[facility] Nurse ostomy consult note Stoma type/location: LLQ colostomy Stomal assessment/size: 1 1/4" pink patent and producing brown soft stool. Peristomal assessment: intact Treatment options for stomal/peristomal skin: barrier ring and 2 piece pouch Output soft brown stool Ostomy pouching: 2pc. Pouch with barrier ring   Education provided: pouch change performed.  Discussed twice weekly pouch changes and rationale for barrier ring.  Encouraged to empty when 1/3 full.  Patient to face time Thursday with daughter to observe pouch change.   Enrolled patient in [facility] program: NO WOC team will follow.   Domenic Moras MSN, RN, FNP-BC CWON Wound, Ostomy, Continence Nurse Pager [phone]

## 2020-01-07 NOTE — Progress Notes (Signed)
PROGRESS NOTE    Anthony Curry.  MBE:[id number] DOB: 1941-03-24 DOA: 01/02/2020 PCP: [name], MD    Brief Narrative:  79 year old gentleman with prior history of stage IV non-small cell lung cancer, SIADH, paroxysmal atrial fibrillation, hypothyroidism, depression, anxiety presents from home with complaints of left lower quadrant abdominal pain associated with diarrhea for a day prior to admission.  And his CT of the abdomen on admission shows acute diverticulitis at the junction of distal descending and sigmoid colon without any signs of perforation or abscess..  But on 1/31 patient started complaining of worsening pain associated with fevers and leukocytosis and a repeat CT abdomen showed perforation with moderate intraperitoneal free air and adynamic ileus.  General surgery consulted and patient underwent urgent exploratory laparotomy, sigmoid resection with end colostomy creation.  A JP drain was also in place.  His hospital course was complicated by air acute ischemic left hand.  It was followed with an arterial duplex which showed proximal obstruction, radial ulnar and palmer arch signals are barely detectable.  Right subclavian artery exhibits normal flow.  Vascular surgery consulted recommended transfer to [facility] for evaluation.  And the patient underwent Left brachial embolectomy on 01/06/2020 by Dr. Oneida Alar and patient was also started on IV heparin.  Plan to transition to oral Eliquis in the morning. An echocardiogram ordered for evaluation of cardiac source of embolus.  Assessment & Plan:   Principal Problem:   Diverticulitis Active Problems:   Hyponatremia   Atrial fibrillation (HCC)   Hypertension   SIADH (syndrome of inappropriate ADH production) ([place])   Acute diverticulitis   Acute diverticulitis with perforation s/p exploratory laparotomy with sigmoid resection with end colostomy creation, JP drain placement by Dr. Kieth Brightly on 1/31. POD day 2 JP drain,  wound dressing changes as per surgery and wound care consult.  Continue with IV Zosyn for now. He was started on clear liquid diet today. Pain control and symptomatic management with antiemetics. Afebrile this morning and leukocytosis improving   Ischemia of the left hand s/p embolectomy Currently on IV heparin plan to transition to oral Eliquis in the next 24 hours.    Paroxysmal atrial fibrillation Rate controlled on Cardizem Continue with IV heparin and plan to transition to Eliquis tomorrow.   Pericardial effusion CT shows mild to moderate pericardial effusion possibly secondary to his malignancy. Repeat echocardiogram ordered. Cardiology consulted recommended to continue with anticoagulation, no acute intervention recommended at this time.   Hyponatremia Probably secondary to SIADH    Hypothyroidism Continue with Synthroid.    Non-small cell lung cancer Follows up with Dr. [name] as outpatient.    Acute respiratory failure with hypoxia secondary to acute COVID-19 viral pneumonia Present on admission Continues to require up to 2 L of oxygen to keep sats around 98%. S/p convalescent plasma on 1/31 On day 3 of remdesivir. Actemra not given due to ongoing acute diverticulitis, immunotherapy for lung cancer. Continue to follow inflammatory markers, wean oxygen as tolerated, continue with incentive spirometry.     DVT prophylaxis: (Lovenox/Heparin/SCD's/anticoagulated/None (if comfort care) Code Status: (Full/Partial - specify details) Family Communication: (Specify name, relationship & date discussed. NO "discussed with patient") Disposition Plan:  . Patient came from: Home           . Anticipated d/c place: Pending . Barriers to d/c OR conditions which need to be met to effect a safe d/c: Patient is POD 2, clearance from surgery, pending PT evaluation, clinical improvement   Consultants:  Surgery  Vascular surgery  Cardiology.   Procedures: S/p  exploratory laparotomy, sigmoid resection and colostomy creation, JP drain placement on 1/31 by Dr. Kieth Brightly  Brachial embolectomy by Dr. Oneida Alar on 01/06/20.  Echocardiogram  Antimicrobials: IV Zosyn  Subjective: Patient reports persistent abdominal pain associated with some nausea.  No vomiting.  No chest pain or shortness of breath.  Objective: Vitals:   01/07/20 0200 01/07/20 0300 01/07/20 0352 01/07/20 0400  BP:   122/79 (!) 107/49  Pulse: (!) 59 (!) 58  (!) 55  Resp: _0 Temp:   98.2 F (36.8 C) 98.2 F (36.8 C)  TempSrc:   Oral Oral  SpO2: 98% 97%  98%  Weight:      Height:        Intake/Output Summary (Last 24 hours) at 01/07/2020 1048 Last data filed at 01/07/2020 0441 Gross per 24 hour  Intake 450 ml  Output 545 ml  Net -95 ml   Filed Weights   01/02/20 1017  Weight: 65.8 kg    Examination:  General exam: Mild to moderate distress from abdominal pain Respiratory system: Clear to auscultation. Respiratory effort normal. Cardiovascular system: S1 & S2 heard, irregular, no JVD no pedal edema. Gastrointestinal system: Abdomen is soft, generalized tenderness, JP drain in place,, colostomy empty Central nervous system: Alert and oriented. No focal neurological deficits. Extremities: No cyanosis Skin: No rashes, Psychiatry:  Mood & affect appropriate.     Data Reviewed: I have personally reviewed following labs and imaging studies  CBC: Recent Labs  Lab 01/02/20 1041 01/03/20 0330 01/04/20 0225 01/05/20 0340 01/05/20 2035 01/06/20 0540 01/07/20 0421  WBC 8.5   < > 12.5* 12.8* 14.9* 13.8* 13.4*  NEUTROABS 7.0  --   --   --  14.1*  --   --   HGB 14.6   < > 14.0 13.4 12.0* 12.1* 11.2*  HCT 42.4   < > 41.5 39.2 34.7* 35.6* 32.4*  MCV 89.1   < > 89.8 90.3 88.7 91.8 89.3  PLT 222   < > 238 242 227 220 273   < > = values in this interval not displayed.   Basic Metabolic Panel: Recent Labs  Lab 01/03/20 0330 01/04/20 0225 01/05/20 0340  01/06/20 0540 01/07/20 0421  NA 131* 131* 130* 132* 134*  K 4.3 3.7 3.3* 4.1 3.8  CL 98 100 101 102 104  CO2 23 21* 21* 21* 24  GLUCOSE 146* 107* 83 148* 109*  BUN _1 27*  CREATININE 0.82 0.81 0.72 0.73 0.79  CALCIUM 8.7* 8.3* 7.7* 8.1* 8.0*   GFR: Estimated Creatinine Clearance: 68.5 mL/min (by C-G formula based on SCr of 0.79 mg/dL). Liver Function Tests: Recent Labs  Lab 01/02/20 1041 01/03/20 0330 01/04/20 0225 01/05/20 0340  AST _2 ALT _3 ALKPHOS 88 71 63 64  BILITOT 1.4* 0.9 1.3* 1.0  PROT 6.5 5.8* 5.6* 4.9*  ALBUMIN 3.7 3.3* 3.0* 2.3*   Recent Labs  Lab 01/02/20 1041  LIPASE 19   No results for input(s): AMMONIA in the last 168 hours. Coagulation Profile: No results for input(s): INR, PROTIME in the last 168 hours. Cardiac Enzymes: No results for input(s): CKTOTAL, CKMB, CKMBINDEX, TROPONINI in the last 168 hours. BNP (last 3 results) No results for input(s): PROBNP in the last 8760 hours. HbA1C: No results for input(s): HGBA1C in the last 72 hours. CBG: No results for input(s): GLUCAP in the  last 168 hours. Lipid Profile: No results for input(s): CHOL, HDL, LDLCALC, TRIG, CHOLHDL, LDLDIRECT in the last 72 hours. Thyroid Function Tests: No results for input(s): TSH, T4TOTAL, FREET4, T3FREE, THYROIDAB in the last 72 hours. Anemia Panel: Recent Labs    01/06/20 0540 01/07/20 0421  FERRITIN 211 348*   Sepsis Labs: No results for input(s): PROCALCITON, LATICACIDVEN in the last 168 hours.  Recent Results (from the past 240 hour(s))  Novel Coronavirus, NAA ([facility])     Status: Abnormal   Collection Time: 01/01/20  2:24 PM   Specimen: Nasopharyngeal(NP) swabs in vial transport medium   NASOPHARYNGE  TESTING  Result Value Ref Range Status   SARS-CoV-2, NAA Detected (A) Not Detected Final    Comment: This nucleic acid amplification test was developed and its performance characteristics determined by [employer].  Nucleic acid amplification tests include RT-PCR and TMA. This test has not been FDA cleared or approved. This test has been authorized by FDA under an Emergency Use Authorization (EUA). This test is only authorized for the duration of time the declaration that circumstances exist justifying the authorization of the emergency use of in vitro diagnostic tests for detection of SARS-CoV-2 virus and/or diagnosis of COVID-19 infection under section 564(b)(1) of the Act, 21 U.S.C. 088PJS-3(P) (1), unless the authorization is terminated or revoked sooner. When diagnostic testing is negative, the possibility of a false negative result should be considered in the context of a patient's recent exposures and the presence of clinical signs and symptoms consistent with COVID-19. An individual without symptoms of COVID-19 and who is not shedding SARS-CoV-2 virus wo uld expect to have a negative (not detected) result in this assay.   SARS CORONAVIRUS 2 (TAT 6-24 HRS) Nasopharyngeal Nasopharyngeal Swab     Status: Abnormal   Collection Time: 01/02/20  2:29 PM   Specimen: Nasopharyngeal Swab  Result Value Ref Range Status   SARS Coronavirus 2 POSITIVE (A) NEGATIVE Final    Comment: RESULT CALLED TO, READ BACK BY AND VERIFIED WITH: T BAKER,RN 2015 01/02/2020 D BRADLEY (NOTE) SARS-CoV-2 target nucleic acids are DETECTED. The SARS-CoV-2 RNA is generally detectable in upper and lower respiratory specimens during the acute phase of infection. Positive results are indicative of the presence of SARS-CoV-2 RNA. Clinical correlation with patient history and other diagnostic information is  necessary to determine patient infection status. Positive results do not rule out bacterial infection or co-infection with other viruses.  The expected result is Negative. Fact Sheet for Patients: SugarRoll.be Fact Sheet for Healthcare Providers: https://www.woods-mathews.com/ This  test is not yet approved or cleared by the [place] FDA and  has been authorized for detection and/or diagnosis of SARS-CoV-2 by FDA under an Emergency Use Authorization (EUA). This EUA will remain  in effect (meaning this test can be used) for the  duration of the COVID-19 declaration under Section 564(b)(1) of the Act, 21 U.S.C. section 360bbb-3(b)(1), unless the authorization is terminated or revoked sooner. Performed at [facility], [place] [address] [place], [place] [address]   Culture, blood (routine x 2)     Status: None (Preliminary result)   Collection Time: 01/03/20 10:10 PM   Specimen: BLOOD  Result Value Ref Range Status   Specimen Description   Final    BLOOD RIGHT ANTECUBITAL Performed at [facility], [place] [address], [place], [place] [address]    Special Requests   Final    BOTTLES DRAWN AEROBIC AND ANAEROBIC Blood Culture adequate volume Performed at [facility]  [place] [address], [place], [place] [address]    Culture NO GROWTH 4 DAYS  Final   Report Status PENDING  Incomplete  Culture, blood (routine x 2)     Status: None (Preliminary result)   Collection Time: 01/03/20 10:10 PM   Specimen: BLOOD  Result Value Ref Range Status   Specimen Description BLOOD LEFT HAND  Final   Special Requests   Final    BOTTLES DRAWN AEROBIC AND ANAEROBIC Blood Culture adequate volume Performed at [place] [address] [place], [place] [address]    Culture NO GROWTH 4 DAYS  Final   Report Status PENDING  Incomplete  Surgical pcr screen     Status: None   Collection Time: 01/05/20  2:45 PM   Specimen: Nasal Mucosa; Nasal Swab  Result Value Ref Range Status   MRSA, PCR NEGATIVE NEGATIVE Final   Staphylococcus aureus NEGATIVE NEGATIVE Final    Comment: (NOTE) The Xpert SA Assay (FDA approved for NASAL specimens in patients 70 years of age and older), is one component of a comprehensive surveillance program. It is not  intended to diagnose infection nor to guide or monitor treatment. Performed at [facility], [place] [address], [place], [place] [address]   Culture, blood (routine x 2)     Status: None (Preliminary result)   Collection Time: 01/06/20  8:15 AM   Specimen: BLOOD RIGHT HAND  Result Value Ref Range Status   Specimen Description BLOOD RIGHT HAND  Final   Special Requests   Final    BOTTLES DRAWN AEROBIC AND ANAEROBIC Blood Culture adequate volume Performed at [place] [address] [place], [place] [address]    Culture NO GROWTH < 24 HOURS  Final   Report Status PENDING  Incomplete  Culture, blood (routine x 2)     Status: None (Preliminary result)   Collection Time: 01/06/20  8:15 AM   Specimen: BLOOD  Result Value Ref Range Status   Specimen Description BLOOD RIGHT ARM  Final   Special Requests   Final    BOTTLES DRAWN AEROBIC AND ANAEROBIC Blood Culture adequate volume Performed at [place] [address] [place], [place] [address]    Culture NO GROWTH < 24 HOURS  Final   Report Status PENDING  Incomplete         Radiology Studies: DG Chest 2 View  Result Date: 01/05/2020 CLINICAL DATA:  Patient with stage IV lung cancer. Left lower quadrant abdominal pain. Diarrhea. EXAM: CHEST - 2 VIEW COMPARISON:  CT scan from January 05, 2020 at 11:39 a.m. FINDINGS: Free air seen under the diaphragm, also appreciated on the CT scan from earlier today. Healed right rib fractures. Mild infiltrate in the medial right lung base, more prominent since January 02, 2020. Pleural thickening versus small left pleural effusion. Atelectasis or scar in the left base. The heart, hila, mediastinum, lungs, and pleura are otherwise unchanged. IMPRESSION: 1. Free air under the diaphragm. This finding was seen on the CT scan from earlier today and called to Dr. Tana Coast by Dr. Jobe Igo at 12:30 p.m. 2. Mild opacity in the right base is likely  atelectasis based on CT imaging from earlier today. 3. No other changes. Electronically Signed   By: Dorise Bullion III M.D   On: 01/05/2020 12:43   CT ABDOMEN PELVIS W CONTRAST  Result Date: 01/05/2020 CLINICAL DATA:  Nausea and vomiting. Acute diverticulitis. Worsening pain. Fevers. EXAM: CT  ABDOMEN AND PELVIS WITH CONTRAST TECHNIQUE: Multidetector CT imaging of the abdomen and pelvis was performed using the standard protocol following bolus administration of intravenous contrast. CONTRAST:  160m OMNIPAQUE IOHEXOL 300 MG/ML  SOLN COMPARISON:  01/02/2020 FINDINGS: Lower chest: Bibasilar atelectasis. Normal heart size with small chronic pericardial effusion. Small bilateral pleural effusions, new. Multivessel coronary artery atherosclerosis. Hepatobiliary: Scattered well-circumscribed low-density liver lesions are likely cysts. Normal gallbladder, without biliary ductal dilatation. Pancreas: Normal, without mass or ductal dilatation. Spleen: Normal in size, without focal abnormality. Adrenals/Urinary Tract: Normal left adrenal gland. Mild right adrenal thickening. Right renal cortical thinning. No hydronephrosis. Decompressed urinary bladder around a Foley catheter. Stomach/Bowel: Normal stomach, without wall thickening. Scattered colonic diverticula. Increased wall thickening involving the proximal sigmoid, including on 67/2. Mid small bowel loops measure up to 2.4 cm. Pelvic small bowel loops are mildly thickened, including on 73/2. Vascular/Lymphatic: Advanced aortic and branch vessel atherosclerosis. No abdominopelvic adenopathy. Reproductive: Normal prostate. Other: New small volume cul-de-sac fluid, including on 71/2. Extensive, moderate volume free intraperitoneal air, including within the right upper quadrant on 27/2 and adjacent the right lobe of the liver on 29/2. Multiple small locules of pelvic free air, including on 61/2. Enhancing small volume fluid within the anterior left pelvis on 66/2 and up  to 3.8 x 1.5 cm just inferior to this on 71/2. Musculoskeletal: Bilateral remote rib fractures. Lumbosacral spondylosis. IMPRESSION: 1. Moderate free intraperitoneal air, likely due to complicated sigmoid diverticulitis. Small volume pericolonic fluid and enhancement, suspicious for infected ascites. 2. Borderline small bowel dilatation, suggesting adynamic ileus. Small bowel wall thickening within the pelvis is likely due to secondary enteritis. 3. New small bilateral pleural effusions with similar pericardial effusion. 4. Coronary artery atherosclerosis. Aortic Atherosclerosis (ICD10-I70.0). Critical test results telephoned to Dr. RTana Coast At the time of interpretation at 12:30 p.m.On 01/05/2020. Electronically Signed   By: KAbigail MiyamotoM.D.   On: 01/05/2020 12:32   VAS [place]PPER EXTREMITY ARTERIAL DUPLEX  Result Date: 01/06/2020 UPPER EXTREMITY DUPLEX STUDY Indications: Patient complains of cool left upper extremity with pallor. History:     Patient has a history ofrecent left upper extremity arterial line.  Comparison Study: No prior study. Performing Technologist: MMaudry MayhewMHA, RDMS, RVT, RDCS  Examination Guidelines: A complete evaluation includes B-mode imaging, spectral Doppler, color Doppler, and power Doppler as needed of all accessible portions of each vessel. Bilateral testing is considered an integral part of a complete examination. Limited examinations for reoccurring indications may be performed as noted.  Right Doppler Findings: +---------------+----------+--------+--------+--------+ Site           PSV (cm/s)WaveformStenosisComments +---------------+----------+--------+--------+--------+ Subclavian Prox134       biphasic                 +---------------+----------+--------+--------+--------+  Left Doppler Findings: +---------------+----------+--------------+--------+--------+ Site           PSV (cm/s)Waveform      StenosisComments  +---------------+----------+--------------+--------+--------+ Subclavian Prox17        monophasic                     +---------------+----------+--------------+--------+--------+ Subclavian Mid 12        monophasic                     +---------------+----------+--------------+--------+--------+ Axillary       15.00     monophasic                     +---------------+----------+--------------+--------+--------+ Brachial Prox  14        monophasic                     +---------------+----------+--------------+--------+--------+ Brachial Dist  11        monophasic                     +---------------+----------+--------------+--------+--------+ Radial Prox    4         barely audible                 +---------------+----------+--------------+--------+--------+ Radial Dist              barely audible                 +---------------+----------+--------------+--------+--------+ Ulnar Dist     10        barely audible                 +---------------+----------+--------------+--------+--------+ Palmar Arch    6         barely audible                 +---------------+----------+--------------+--------+--------+   Summary:  Right: No obstruction visualized in the right subclavian artery. Left: Monophasic flow at the proximal left subclavian artery is       suggestive of proximal obstruction. Radial, ulnar, and palmar       arch signals are patent, however signals are barely       detectable. *See table(s) above for measurements and observations. Electronically signed by Ruta Hinds MD on 01/06/2020 at 5:06:56 PM.    Final    HYBRID OR IMAGING ([place])  Result Date: 01/06/2020 There is no interpretation for this exam.  This order is for images obtained during a surgical procedure.  Please See "Surgeries" Tab for more information regarding the procedure.        Scheduled Meds: . vitamin C  500 mg Oral BID  . chlorhexidine  15 mL Mouth Rinse BID  .  Chlorhexidine Gluconate Cloth  6 each Topical Daily  . demeclocycline  150 mg Oral BID  . diltiazem  180 mg Oral Q1200  . feeding supplement  1 Container Oral TID BM  . fluticasone  2 spray Each Nare QHS  . Ipratropium-Albuterol  1 puff Inhalation Q6H  . levothyroxine  25 mcg Intravenous Daily  . mouth rinse  15 mL Mouth Rinse q12n4p  . mirtazapine  30 mg Oral QHS  . mometasone-formoterol  2 puff Inhalation BID  . tamsulosin  0.4 mg Oral BID  . thiamine  100 mg Oral Daily  . zinc sulfate  220 mg Oral Daily   Continuous Infusions: . sodium chloride 250 mL (01/06/20 2225)  . heparin 900 Units/hr (01/07/20 0654)  . piperacillin-tazobactam (ZOSYN)  IV 3.375 g (01/07/20 0441)  . remdesivir 100 mg in NS 100 mL 100 mg (01/07/20 0903)  . sodium chloride Stopped (01/05/20 2120)     LOS: 5 days        Hosie Poisson, MD [facility]   To contact the attending provider between 7A-7P or the covering provider during after hours 7P-7A, please log into the web site [email or web address] and access using universal [place] password for that web site. If you do not have the password, please call the hospital operator.  01/07/2020, 10:48 AM

## 2020-01-07 NOTE — Progress Notes (Addendum)
Anthony Curry for Heparin Indication: atrial fibrillation, s/p L subclavian axillary brachial radial ulnar embolectomy  No Known Allergies  Patient Measurements: Height: 5\' 11"  (180.3 cm) Weight: 145 lb (65.8 kg) IBW/kg (Calculated) : 75.3 Heparin Dosing Weight: 65.8 kg  Vital Signs: Temp: 98.2 F (36.8 C) (02/02 0400) Temp Source: Oral (02/02 0400) BP: 107/49 (02/02 0400) Pulse Rate: 55 (02/02 0400)  Labs: Recent Labs    01/05/20 0340 01/05/20 0340 01/05/20 2035 01/05/20 2035 01/06/20 0540 01/07/20 0421  HGB 13.4   < > 12.0*   < > 12.1* 11.2*  HCT 39.2   < > 34.7*  --  35.6* 32.4*  PLT 242   < > 227  --  220 273  APTT  --   --   --   --   --  78*  HEPARINUNFRC  --   --   --   --   --  1.03*  CREATININE 0.72  --   --   --  0.73 0.79   < > = values in this interval not displayed.    Estimated Creatinine Clearance: 68.5 mL/min (by C-G formula based on SCr of 0.79 mg/dL).   Medical History: Past Medical History:  Diagnosis Date  . 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 Adrenal mass ([place])   . Alcoholism in remission ([place])   . Anemia of chronic disease   . 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 Anxiety   . BPH (benign prostatic hyperplasia)   . Cancer ([place])    small cell/lung  . Complication of anesthesia 09/08/2015    JITTERY AFTER GALLBLADDER SURGERY  . GERD (gastroesophageal reflux disease)   . History of radiation therapy 11/14/16, 11/18/16, 11/22/16   Left upper lung: 54 Gy in 3 fractions  . Hypertension   . Neoplastic malignant related fatigue    IV tx. every 2 weeks last9-14-16 (Dr. Earlie Server), radiation last tx. 6 months  . 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 Pericardial effusion   . Persistent atrial fibrillation ([place])   . Pulmonary nodules   . Radiation 12/02/14-12/16/14   Left adrenal gland 30 Gy in 10 fractions    Medications:  Scheduled:  . vitamin C  500 mg Oral BID  . chlorhexidine  15 mL  Mouth Rinse BID  . Chlorhexidine Gluconate Cloth  6 each Topical Daily  . demeclocycline  150 mg Oral BID  . diltiazem  180 mg Oral Q1200  . feeding supplement  1 Container Oral TID BM  . fluticasone  2 spray Each Nare QHS  . Ipratropium-Albuterol  1 puff Inhalation Q6H  . levothyroxine  25 mcg Intravenous Daily  . mouth rinse  15 mL Mouth Rinse q12n4p  . mirtazapine  30 mg Oral QHS  . mometasone-formoterol  2 puff Inhalation BID  . tamsulosin  0.4 mg Oral BID  . thiamine  100 mg Oral Daily  . zinc sulfate  220 mg Oral Daily   Infusions:  . sodium chloride 250 mL (01/06/20 2225)  . heparin    . piperacillin-tazobactam (ZOSYN)  IV 3.375 g (01/07/20 0441)  . remdesivir 100 mg in NS 100 mL Stopped (01/06/20 1052)  . sodium chloride Stopped (01/05/20 2120)    Assessment: 79 yo M presented to [facility] with sudden onset of abd pain starting 1/27.  Pt on apixaban PTA for hx afib.  1/28 admit to [facility] - CT abd showed acute diverticulitis. No abscess or perf. 1/31 worsening pain /  fevers - CT abd showed perf.  Apixaban given 0930. 1/31 rec'd Andexxa for urgent surgery - ex lap, sigmoid resection and colostomy. 2/1 Transfer to [facility] for VVS eval.  Pt reported numbness/tingling in L hand associated with A line insertion.  OR for left subclavian axillary brachial radial ulnar embolectomy.  Plan for treatment dose heparin given patient experiences an embolic event while off apixaban.  Last dose of apixaban 1/31 at 0930.  2/2 AM update:  APTT therapeutic this AM Heparin level elevated due to Apixaban use  Goal of Therapy:  Heparin level 0.3-0.7 units/ml  aPTT goal 66-102  Monitor platelets by anticoagulation protocol: Yes   Plan:  Cont heparin at 900 units/hr Daily heparin level, aPTT, and CBC while on heparin.  Thank you Anette Guarneri, PharmD

## 2020-01-07 NOTE — Progress Notes (Signed)
    Subjective  - POD #1  Left arm feels better   Physical Exam:  Palpable left brachial and radial pulse Good grip strength       Assessment/Plan:  POD #1  Doing well s/p thrombectomy Continue anticoagulation Will not follow actively, please call with questions or concerns  Wells Braileigh Landenberger 01/07/2020 4:36 PM --  Vitals:   01/07/20 1200 01/07/20 1203  BP: (!) 144/55   Pulse: 67   Resp: (!) 22   Temp:  97.7 F (36.5 C)  SpO2: 95%     Intake/Output Summary (Last 24 hours) at 01/07/2020 1636 Last data filed at 01/07/2020 0441 Gross per 24 hour  Intake 450 ml  Output 545 ml  Net -95 ml     Laboratory CBC    Component Value Date/Time   WBC 13.4 (H) 01/07/2020 0421   HGB 11.2 (L) 01/07/2020 0421   HGB 14.2 12/02/2019 1134   HGB 14.0 12/06/2017 0930   HCT 32.4 (L) 01/07/2020 0421   HCT 39.8 12/06/2017 0930   PLT 273 01/07/2020 0421   PLT 259 12/02/2019 1134   PLT 230 12/06/2017 0930    BMET    Component Value Date/Time   NA 134 (L) 01/07/2020 0421   NA 132 (L) 12/06/2017 0930   K 3.8 01/07/2020 0421   K 4.0 12/06/2017 0930   CL 104 01/07/2020 0421   CO2 24 01/07/2020 0421   CO2 26 12/06/2017 0930   GLUCOSE 109 (H) 01/07/2020 0421   GLUCOSE 89 12/06/2017 0930   BUN 27 (H) 01/07/2020 0421   BUN 15.4 12/06/2017 0930   CREATININE 0.79 01/07/2020 0421   CREATININE 0.97 12/02/2019 1134   CREATININE 0.9 12/06/2017 0930   CALCIUM 8.0 (L) 01/07/2020 0421   CALCIUM 9.9 12/06/2017 0930   GFRNONAA NOT CALCULATED 01/07/2020 0421   GFRNONAA >60 12/02/2019 1134   GFRAA NOT CALCULATED 01/07/2020 0421   GFRAA >60 12/02/2019 1134    COAG Lab Results  Component Value Date   INR 1.1 06/28/2019   INR 1.06 09/03/2015   INR 1.26 06/12/2015   No results found for: PTT  Antibiotics Anti-infectives (From admission, onward)   Start     Dose/Rate Route Frequency Ordered Stop   01/06/20 1000  remdesivir 100 mg in sodium chloride 0.9 % 100 mL IVPB     100  mg 200 mL/hr over 30 Minutes Intravenous Daily 01/05/20 0846 01/10/20 0959   01/05/20 1000  remdesivir 200 mg in sodium chloride 0.9% 250 mL IVPB     200 mg 580 mL/hr over 30 Minutes Intravenous Once 01/05/20 0846 01/05/20 1002   01/02/20 2000  piperacillin-tazobactam (ZOSYN) IVPB 3.375 g     3.375 g 12.5 mL/hr over 240 Minutes Intravenous Every 8 hours 01/02/20 1415     01/02/20 1500  demeclocycline (DECLOMYCIN) tablet 150 mg    Note to Pharmacy: Take 2 tablets twice a day     150 mg Oral 2 times daily 01/02/20 1408     01/02/20 1345  piperacillin-tazobactam (ZOSYN) IVPB 3.375 g     3.375 g 100 mL/hr over 30 Minutes Intravenous  Once 01/02/20 1331 01/02/20 1528       V. Leia Alf, M.D., [facility] [place] Office: [phone] Pager:  [phone]

## 2020-01-07 NOTE — Evaluation (Signed)
Occupational Therapy Evaluation Patient Details Name: Anthony Curry. MRN: [id number] DOB: [birth year] Today's Date: 01/07/2020    History of Present Illness Pt is an 79 y.o. male admitted 01/02/20 with LLQ abdominal pain and diarrhea; also with (+) COVID-19 test. 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 Underwent L brachial, radial, ulnar embolectomies 2/1. PMH includes non-small cell lung CA (currently on chemo).   Clinical Impression   Pt initially stated he was in too much pain to participate in eval today. Requested pain meds and returned later and pt willing to participate with encouragement. Pt seen for OT/PT eval as cotx due to limited tolerance and for pt safety/equipment.  Pt appears to be currently limited by pain however at this time would require min a for functional mobility and basic ADL's. Pt reports his wife can usually assist "a little" but wife was recently discharged home from hospital due to Covid. Dtr is currently staying with wife to assist. If dtr can assist at discharge feel pt could return home with HHOT/PT but feel pt would need 24 hour assistance from daughter at discharge. If dtr is unable to provide assist would recommend short term SNF rehab.  OT to follow.    Follow Up Recommendations  Home health OT(If daughter can assist at d/c otherwise would recommend short course of SNF rehab)    Equipment Recommendations       Recommendations for Other Services       Precautions / Restrictions Precautions Precautions: Fall Restrictions Weight Bearing Restrictions: No      Mobility Bed Mobility Overal bed mobility: Modified Independent             General bed mobility comments: HOB elevated ~20 degrees; +rail  Transfers Overall  transfer level: Needs assistance Equipment used: 4-wheeled walker Transfers: Sit to/from Stand Sit to Stand: Min guard;From elevated surface         General transfer comment: from EOB; close guarding ?dizziness (pt unclear)    Balance Overall balance assessment: Needs assistance Sitting-balance support: No upper extremity supported Sitting balance-Leahy Scale: Fair     Standing balance support: No upper extremity supported Standing balance-Leahy Scale: Poor Standing balance comment: posterior lean when not holding onto rollator                           ADL either performed or assessed with clinical judgement   ADL Overall ADL's : Needs assistance/impaired Eating/Feeding: Set up   Grooming: Set up   Upper Body Bathing: Set up   Lower Body Bathing: Moderate assistance Lower Body Bathing Details (indicate cue type and reason): due to pain, impaired balance, decreased functional mobiity and decreased activity tolerance Upper Body Dressing : Minimal assistance   Lower Body Dressing: Moderate assistance   Toilet Transfer: Minimal assistance;+2 for safety/equipment;RW   Toileting- Clothing Manipulation and Hygiene: Minimal assistance       Functional mobility during ADLs: Minimal assistance       Vision Baseline Vision/History: No visual deficits Vision Assessment?: No apparent visual deficits     Perception     Praxis      Pertinent Vitals/Pain Pain Assessment: 0-10 Pain Score: 5  Pain Location: abdomen; once pt moved to EOB c/o of back pain R lower rated at an  8/10 when he took a deep breath. Pt's belly pain increased to 6/10 by end of session. Pain Descriptors / Indicators: Sharp(especially when he coughs) Pain Intervention(s): Limited activity within patient's tolerance;Monitored during session;Premedicated before session;Repositioned;Other (comment);Patient requesting pain meds-RN notified(instructed in splinting with folded blanket during cough;  had RN bring pain meds this am and then returned later for eval. Pt requesting additional pain meds at end of session and RN notified.)     Hand Dominance Right   Extremity/Trunk Assessment Upper Extremity Assessment Upper Extremity Assessment: Overall WFL for tasks assessed   Lower Extremity Assessment Lower Extremity Assessment: Defer to PT evaluation RLE Sensation: history of peripheral neuropathy LLE Sensation: history of peripheral neuropathy   Cervical / Trunk Assessment Cervical / Trunk Assessment: Other exceptions Cervical / Trunk Exceptions: tends to flex trunk due to abd/surgical pain   Communication Communication Communication: No difficulties   Cognition Arousal/Alertness: Awake/alert Behavior During Therapy: WFL for tasks assessed/performed Overall Cognitive Status: Within Functional Limits for tasks assessed                                     General Comments  Patient a bit gruff, not feeling well and not wanting to mobilize while pain decreased from pain medication. Easily persuaded to participate "a little" and return to bed.     Exercises     Shoulder Instructions      Home Living Family/patient expects to be discharged to:: Private residence Living Arrangements: Spouse/significant other Available Help at Discharge: Family;Available 24 hours/day Type of Home: Other(Comment)(condo) Home Access: Elevator     Home Layout: One level     Bathroom Shower/Tub: Occupational psychologist: Standard     Home Equipment: Environmental consultant - 2 wheels;Cane - single point;Shower seat;Other (comment);Hand held shower head(grab bars - 1 near toilet and 2 in shower)          Prior Functioning/Environment Level of Independence: Independent with assistive device(s)        Comments: using 4 wheeled walker for mobility, independent ADLs         OT Problem List: Decreased strength;Decreased activity tolerance;Impaired balance (sitting and/or  standing);Decreased knowledge of use of DME or AE;Pain      OT Treatment/Interventions: Self-care/ADL training;Therapeutic exercise;Energy conservation;DME and/or AE instruction;Patient/family education;Therapeutic activities;Balance training    OT Goals(Current goals can be found in the care plan section) Acute Rehab OT Goals Patient Stated Goal: decrease abd pain; find out why his left lower back hurts  OT Frequency: Min 2X/week   Barriers to D/C:    pt states wife was just d/c from hospital with Covid but is "doing pretty good."  Dtr is currently staying with wife to assist. IF dtr can assist pt at d/c feel pt coudl return home with HHPT and OT       Co-evaluation              AM-PAC OT "6 Clicks" Daily Activity     Outcome Measure Help from another person eating meals?: None Help from another person taking care of personal grooming?: A Little Help from another person toileting, which includes using toliet, bedpan, or urinal?: A Little Help from another person bathing (including washing, rinsing, drying)?: A Lot Help from another person to put on and taking off regular upper body clothing?: A Lot Help from another person to put on and taking off regular lower body clothing?: A  Lot 6 Click Score: 16   End of Session Equipment Utilized During Treatment: Rolling walker;Oxygen(oxygen replaced at end of session)  Activity Tolerance: Patient limited by pain;Patient limited by fatigue Patient left: in bed;with call bell/phone within reach;with bed alarm set  OT Visit Diagnosis: Unsteadiness on feet (R26.81);Muscle weakness (generalized) (M62.81);Other (comment)(decreased activity tolerance)                Time: 1131-1155 OT Time Calculation (min): 24 min Charges:  OT General Charges $OT Visit: 1 Visit OT Evaluation $OT Eval Low Complexity: 1 Low    Quay Burow, OTR/L 01/07/2020, 2:09 PM

## 2020-01-07 NOTE — Evaluation (Signed)
Physical Therapy Evaluation Patient Details Name: Anthony Curry. MRN: [id number] DOB: [birth year] Today's Date: 01/07/2020   History of Present Illness  Pt is an 79 y.o. male admitted 01/02/20 with LLQ abdominal pain and diarrhea; also with (+) COVID-19 test. 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 Underwent L brachial, radial, ulnar embolectomies 2/1. PMH includes non-small cell lung CA (currently on chemo).  Clinical Impression   Patient is s/p above surgery resulting in functional limitations due to the deficits listed below (see PT Problem List). Patient lives at home with wife (who was recently hospitalized due to [place]). Their daughter is currently staying with his wife. He reports that he does not usually have to assist her physically. He uses rollator at all times due to neuropathy bil feet. Anticipate can return home with HHPT if daughter can stay and provide 24/7 supervision to min assist. If she is not able to be present, may have to consider SNF until stronger and moving better.  Patient will benefit from skilled PT to increase their independence and safety with mobility to allow discharge to the venue listed below.       Follow Up Recommendations Home health PT;Supervision/Assistance - 24 hour(if daughter can stay and continue to assist)    Equipment Recommendations  None recommended by PT    Recommendations for Other Services       Precautions / Restrictions Precautions Precautions: Fall Restrictions Weight Bearing Restrictions: No      Mobility  Bed Mobility Overal bed mobility: Modified Independent             General bed mobility comments: HOB elevated ~20 degrees; +rail  Transfers Overall transfer level: Needs assistance Equipment  used: 4-wheeled walker Transfers: Sit to/from Stand Sit to Stand: Min guard;From elevated surface         General transfer comment: from EOB; close guarding ?dizziness (pt unclear)  Ambulation/Gait Ambulation/Gait assistance: Min assist;+2 safety/equipment Gait Distance (Feet): 2 Feet Assistive device: 4-wheeled walker Gait Pattern/deviations: Step-to pattern;Decreased stride length     General Gait Details: pt very sleepy from pain meds; only walked a couple steps and then stepped backwards towards bed   Stairs            Wheelchair Mobility    Modified Rankin (Stroke Patients Only)       Balance Overall balance assessment: Needs assistance         Standing balance support: No upper extremity supported Standing balance-Leahy Scale: Poor Standing balance comment: posterior lean when not holding onto rollator                             Pertinent Vitals/Pain Pain Assessment: 0-10 Pain Score: 5  Pain Location: abdomen Pain Descriptors / Indicators: Sharp(especially when he coughs) Pain Intervention(s): Limited activity within patient's tolerance;Monitored during session;Premedicated before session;Repositioned;Other (comment)(instructed in splinting with folded blanket during cough)    Home Living Family/patient expects to be discharged to:: Private residence Living Arrangements: Spouse/significant other Available Help at Discharge: Family;Available 24 hours/day Type of Home: Other(Comment)(condo) Home Access: Elevator     Home Layout: One level Home Equipment: Walker - 2 wheels;Cane - single point;Shower seat;Other (comment);Hand held shower head(grab bars - 1 near toilet and 2 in shower)  Prior Function Level of Independence: Independent with assistive device(s)         Comments: using 4 wheeled walker for mobility, independent ADLs      Hand Dominance   Dominant Hand: Right    Extremity/Trunk Assessment   Upper Extremity  Assessment Upper Extremity Assessment: Defer to OT evaluation    Lower Extremity Assessment Lower Extremity Assessment: RLE deficits/detail;LLE deficits/detail RLE Sensation: history of peripheral neuropathy LLE Sensation: history of peripheral neuropathy    Cervical / Trunk Assessment Cervical / Trunk Assessment: Other exceptions Cervical / Trunk Exceptions: tends to flex trunk due to abd/surgical pain  Communication   Communication: No difficulties  Cognition Arousal/Alertness: Awake/alert Behavior During Therapy: WFL for tasks assessed/performed Overall Cognitive Status: Within Functional Limits for tasks assessed                                        General Comments General comments (skin integrity, edema, etc.): Patient a bit gruff, not feeling well and not wanting to mobilize while pain decreased from pain medication. Easily persuaded to participate "a little" and return to bed.     Exercises     Assessment/Plan    PT Assessment Patient needs continued PT services  PT Problem List Decreased activity tolerance;Decreased balance;Decreased mobility;Decreased knowledge of use of DME;Impaired sensation;Pain       PT Treatment Interventions DME instruction;Gait training;Functional mobility training;Therapeutic activities;Therapeutic exercise;Balance training;Patient/family education    PT Goals (Current goals can be found in the Care Plan section)  Acute Rehab PT Goals Patient Stated Goal: decrease abd pain; find out why his left lower back hurts PT Goal Formulation: With patient Time For Goal Achievement: 01/21/20 Potential to Achieve Goals: Good    Frequency Min 3X/week   Barriers to discharge Decreased caregiver support wife sick with COVID; daughter staying with mom--? can care for both parents    Co-evaluation               AM-PAC PT "6 Clicks" Mobility  Outcome Measure Help needed turning from your back to your side while in a flat  bed without using bedrails?: A Little Help needed moving from lying on your back to sitting on the side of a flat bed without using bedrails?: A Little Help needed moving to and from a bed to a chair (including a wheelchair)?: A Little Help needed standing up from a chair using your arms (e.g., wheelchair or bedside chair)?: A Little Help needed to walk in hospital room?: A Little Help needed climbing 3-5 steps with a railing? : A Lot 6 Click Score: 17    End of Session Equipment Utilized During Treatment: Oxygen(while at EOB, removed; return supine 91% & applied) Activity Tolerance: Patient limited by pain Patient left: in bed;with call bell/phone within reach   PT Visit Diagnosis: Unsteadiness on feet (R26.81);Other symptoms and signs involving the nervous system (R29.898)    Time: 9373-4287 PT Time Calculation (min) (ACUTE ONLY): 24 min   Charges:   PT Evaluation $PT Eval Low Complexity: 1 Low           Arby Barrette, PT Pager [phone]   Rexanne Mano 01/07/2020, 12:46 PM

## 2020-01-07 NOTE — Progress Notes (Signed)
Patient ID: Anthony Curry., male   DOB: [birth year], 79 y.o.   MRN: [id number]    1 Day Post-Op  Subjective: Patient is tired, but otherwise feeling better today.  L hand feeling much better.  No nausea.  Hasn't been up and mobilizing at this point.  Denies SOB, on O2.  ROS: See above, otherwise other systems negative  Objective: Vital signs in last 24 hours: Temp:  [98 F (36.7 C)-99.4 F (37.4 C)] 98.2 F (36.8 C) (02/02 0400) Pulse Rate:  [55-81] 55 (02/02 0400) Resp:  [12-23] 18 (02/02 0400) BP: (107-166)/(48-79) 107/49 (02/02 0400) SpO2:  [91 %-100 %] 98 % (02/02 0400) Last BM Date: 01/06/20  Intake/Output from previous day: 02/01 0701 - 02/02 0700 In: 450 [I.V.:450] Out: 575 [Urine:400; Drains:100; Blood:75] Intake/Output this shift: No intake/output data recorded.  PE: Abd: soft, appropriately tender, midline wound is clean and packed.  Colostomy with air and small amount of feculent output.  Some BS.  JP drain with serous output in bulb.  Some bloody drainage in tubing.  Stoma is pink and viable.    Lab Results:  Recent Labs    01/06/20 0540 01/07/20 0421  WBC 13.8* 13.4*  HGB 12.1* 11.2*  HCT 35.6* 32.4*  PLT 220 273   BMET Recent Labs    01/06/20 0540 01/07/20 0421  NA 132* 134*  K 4.1 3.8  CL 102 104  CO2 21* 24  GLUCOSE 148* 109*  BUN 20 27*  CREATININE 0.73 0.79  CALCIUM 8.1* 8.0*   PT/INR No results for input(s): LABPROT, INR in the last 72 hours. CMP     Component Value Date/Time   NA 134 (L) 01/07/2020 0421   NA 132 (L) 12/06/2017 0930   K 3.8 01/07/2020 0421   K 4.0 12/06/2017 0930   CL 104 01/07/2020 0421   CO2 24 01/07/2020 0421   CO2 26 12/06/2017 0930   GLUCOSE 109 (H) 01/07/2020 0421   GLUCOSE 89 12/06/2017 0930   BUN 27 (H) 01/07/2020 0421   BUN 15.4 12/06/2017 0930   CREATININE 0.79 01/07/2020 0421   CREATININE 0.97 12/02/2019 1134   CREATININE 0.9 12/06/2017 0930   CALCIUM 8.0 (L) 01/07/2020 0421   CALCIUM 9.9  12/06/2017 0930   PROT 4.9 (L) 01/05/2020 0340   PROT 6.3 (L) 12/06/2017 0930   ALBUMIN 2.3 (L) 01/05/2020 0340   ALBUMIN 3.7 12/06/2017 0930   AST 21 01/05/2020 0340   AST 17 12/02/2019 1134   AST 20 12/06/2017 0930   ALT 16 01/05/2020 0340   ALT 11 12/02/2019 1134   ALT 14 12/06/2017 0930   ALKPHOS 64 01/05/2020 0340   ALKPHOS 107 12/06/2017 0930   BILITOT 1.0 01/05/2020 0340   BILITOT 0.6 12/02/2019 1134   BILITOT 0.76 12/06/2017 0930   GFRNONAA NOT CALCULATED 01/07/2020 0421   GFRNONAA >60 12/02/2019 1134   GFRAA NOT CALCULATED 01/07/2020 0421   GFRAA >60 12/02/2019 1134   Lipase     Component Value Date/Time   LIPASE 19 01/02/2020 1041       Studies/Results: DG Chest 2 View  Result Date: 01/05/2020 CLINICAL DATA:  Patient with stage IV lung cancer. Left lower quadrant abdominal pain. Diarrhea. EXAM: CHEST - 2 VIEW COMPARISON:  CT scan from January 05, 2020 at 11:39 a.m. FINDINGS: Free air seen under the diaphragm, also appreciated on the CT scan from earlier today. Healed right rib fractures. Mild infiltrate in the medial right lung base, more prominent since January 02, 2020. Pleural thickening versus small left pleural effusion. Atelectasis or scar in the left base. The heart, hila, mediastinum, lungs, and pleura are otherwise unchanged. IMPRESSION: 1. Free air under the diaphragm. This finding was seen on the CT scan from earlier today and called to Dr. Tana Coast by Dr. Jobe Igo at 12:30 p.m. 2. Mild opacity in the right base is likely atelectasis based on CT imaging from earlier today. 3. No other changes. Electronically Signed   By: Dorise Bullion III M.D   On: 01/05/2020 12:43   CT ABDOMEN PELVIS W CONTRAST  Result Date: 01/05/2020 CLINICAL DATA:  Nausea and vomiting. Acute diverticulitis. Worsening pain. Fevers. EXAM: CT ABDOMEN AND PELVIS WITH CONTRAST TECHNIQUE: Multidetector CT imaging of the abdomen and pelvis was performed using the standard protocol following bolus  administration of intravenous contrast. CONTRAST:  147mL OMNIPAQUE IOHEXOL 300 MG/ML  SOLN COMPARISON:  01/02/2020 FINDINGS: Lower chest: Bibasilar atelectasis. Normal heart size with small chronic pericardial effusion. Small bilateral pleural effusions, new. Multivessel coronary artery atherosclerosis. Hepatobiliary: Scattered well-circumscribed low-density liver lesions are likely cysts. Normal gallbladder, without biliary ductal dilatation. Pancreas: Normal, without mass or ductal dilatation. Spleen: Normal in size, without focal abnormality. Adrenals/Urinary Tract: Normal left adrenal gland. Mild right adrenal thickening. Right renal cortical thinning. No hydronephrosis. Decompressed urinary bladder around a Foley catheter. Stomach/Bowel: Normal stomach, without wall thickening. Scattered colonic diverticula. Increased wall thickening involving the proximal sigmoid, including on 67/2. Mid small bowel loops measure up to 2.4 cm. Pelvic small bowel loops are mildly thickened, including on 73/2. Vascular/Lymphatic: Advanced aortic and branch vessel atherosclerosis. No abdominopelvic adenopathy. Reproductive: Normal prostate. Other: New small volume cul-de-sac fluid, including on 71/2. Extensive, moderate volume free intraperitoneal air, including within the right upper quadrant on 27/2 and adjacent the right lobe of the liver on 29/2. Multiple small locules of pelvic free air, including on 61/2. Enhancing small volume fluid within the anterior left pelvis on 66/2 and up to 3.8 x 1.5 cm just inferior to this on 71/2. Musculoskeletal: Bilateral remote rib fractures. Lumbosacral spondylosis. IMPRESSION: 1. Moderate free intraperitoneal air, likely due to complicated sigmoid diverticulitis. Small volume pericolonic fluid and enhancement, suspicious for infected ascites. 2. Borderline small bowel dilatation, suggesting adynamic ileus. Small bowel wall thickening within the pelvis is likely due to secondary enteritis. 3.  New small bilateral pleural effusions with similar pericardial effusion. 4. Coronary artery atherosclerosis. Aortic Atherosclerosis (ICD10-I70.0). Critical test results telephoned to Dr. Tana Coast. At the time of interpretation at 12:30 p.m.On 01/05/2020. Electronically Signed   By: Abigail Miyamoto M.D.   On: 01/05/2020 12:32   VAS [place] UPPER EXTREMITY ARTERIAL DUPLEX  Result Date: 01/06/2020 UPPER EXTREMITY DUPLEX STUDY Indications: Patient complains of cool left upper extremity with pallor. History:     Patient has a history ofrecent left upper extremity arterial line.  Comparison Study: No prior study. Performing Technologist: Maudry Mayhew MHA, RDMS, RVT, RDCS  Examination Guidelines: A complete evaluation includes B-mode imaging, spectral Doppler, color Doppler, and power Doppler as needed of all accessible portions of each vessel. Bilateral testing is considered an integral part of a complete examination. Limited examinations for reoccurring indications may be performed as noted.  Right Doppler Findings: +---------------+----------+--------+--------+--------+ Site           PSV (cm/s)WaveformStenosisComments +---------------+----------+--------+--------+--------+ Subclavian Prox134       biphasic                 +---------------+----------+--------+--------+--------+  Left Doppler Findings: +---------------+----------+--------------+--------+--------+ Site  PSV (cm/s)Waveform      StenosisComments +---------------+----------+--------------+--------+--------+ Subclavian Prox17        monophasic                     +---------------+----------+--------------+--------+--------+ Subclavian Mid 12        monophasic                     +---------------+----------+--------------+--------+--------+ Axillary       15.00     monophasic                     +---------------+----------+--------------+--------+--------+ Brachial Prox  14        monophasic                      +---------------+----------+--------------+--------+--------+ Brachial Dist  11        monophasic                     +---------------+----------+--------------+--------+--------+ Radial Prox    4         barely audible                 +---------------+----------+--------------+--------+--------+ Radial Dist              barely audible                 +---------------+----------+--------------+--------+--------+ Ulnar Dist     10        barely audible                 +---------------+----------+--------------+--------+--------+ Palmar Arch    6         barely audible                 +---------------+----------+--------------+--------+--------+   Summary:  Right: No obstruction visualized in the right subclavian artery. Left: Monophasic flow at the proximal left subclavian artery is       suggestive of proximal obstruction. Radial, ulnar, and palmar       arch signals are patent, however signals are barely       detectable. *See table(s) above for measurements and observations. Electronically signed by Ruta Hinds MD on 01/06/2020 at 5:06:56 PM.    Final    HYBRID OR IMAGING ([place])  Result Date: 01/06/2020 There is no interpretation for this exam.  This order is for images obtained during a surgical procedure.  Please See "Surgeries" Tab for more information regarding the procedure.    Anti-infectives: Anti-infectives (From admission, onward)   Start     Dose/Rate Route Frequency Ordered Stop   01/06/20 1000  remdesivir 100 mg in sodium chloride 0.9 % 100 mL IVPB     100 mg 200 mL/hr over 30 Minutes Intravenous Daily 01/05/20 0846 01/10/20 0959   01/05/20 1000  remdesivir 200 mg in sodium chloride 0.9% 250 mL IVPB     200 mg 580 mL/hr over 30 Minutes Intravenous Once 01/05/20 0846 01/05/20 1002   01/02/20 2000  piperacillin-tazobactam (ZOSYN) IVPB 3.375 g     3.375 g 12.5 mL/hr over 240 Minutes Intravenous Every 8 hours 01/02/20 1415     01/02/20 1500   demeclocycline (DECLOMYCIN) tablet 150 mg    Note to Pharmacy: Take 2 tablets twice a day     150 mg Oral 2 times daily 01/02/20 1408     01/02/20 1345  piperacillin-tazobactam (ZOSYN) IVPB 3.375 g     3.375 g 100 mL/hr over 30 Minutes Intravenous  Once 01/02/20 1331 01/02/20 1528       Assessment/Plan Acute hypoxic respiratory failure due to acute COVID-19 viral pneumonia History of stage IV non-small cell lung CA - undergoing chemo treatment, on hold  Paroxysmal atrial fibrillation - eliquis on hold, last dose 01/31 am, on heparin gtt for cold left hand  Mild protein calorie malnutrition - albumin 2.3 Hematuria - per uro/primary  POD 2, S/P exploratory laparotomy, sigmoid resection with end colostomy creation, JP drain, Dr. Kieth Brightly, 01/31 for Acute diverticulitis with perforation - CLD -WOC RN consult for new stoma - ambulate, IS -cont JP drain -BID WD dressing changes to midline wound -cont zosyn Ischemic left hand, s/p embolectomy - on heparin gtt, per vascular  FEN: CLD VTE: SCD's, heparin gtt ID: Zosyn 01/28>>  Remdesivir 01/31>> Foley: yes, ok to remove from surgery standpoint.  Will defer to primary/uro given hematuria Follow up: Dr. Kieth Brightly    LOS: 5 days    Henreitta Cea , [facility] Surgery 01/07/2020, 8:23 AM Please see Amion for pager number during day hours 7:00am-4:30pm or 7:00am -11:30am on weekends

## 2020-01-07 NOTE — Progress Notes (Signed)
Nutrition Follow-up   RD working remotely.  DOCUMENTATION CODES:   Not applicable  INTERVENTION:  Provide Boost Breeze po TID, each supplement provides 250 kcal and 9 grams of protein.  Provide 30 ml Prostat po BID, each supplement provides 100 kcal and 15 grams of protein.   Encourage adequate PO intake.   NUTRITION DIAGNOSIS:   Increased nutrient needs related to acute illness, catabolic OACZYSA(YTKZS-01 infection) as evidenced by estimated needs; ongoing  GOAL:   Patient will meet greater than or equal to 90% of their needs; progressing  MONITOR:   PO intake, Supplement acceptance, Diet advancement, Skin, Weight trends, Labs, I & O's  REASON FOR ASSESSMENT:   Malnutrition Screening Tool    ASSESSMENT:   79 y.o. male with medical history of stage IV R-sided NSCLC, SIADH, A. fib, hypothyroidism, depression, and anxiety. He presented to the ED due to sudden onset of abdomen pain since 1/27. Pain was located mainly in the LLQ. He did not report of any nausea or vomiting. Patient's wife recently tested positive for COVID-19. CT abdomen showed acute diverticulitis at the junction of distal descending and sigmoid colon.  No abscess or perforation. On 1/31, patient complaining of worsening pain, fevers.  WBC count worsening. CT abdomen was repeated which showed perforation with moderate free intraperitoneal air with adynamic ileus  1/31 - s/p ex lap, sigmoid resection with end colostomy creation, JP drain 2/1 - Left subclavian axillary brachial radial ulnar embolectomy due to acute ischemia left hand  Pt is currently on a clear liquid diet and has been tolerating it. Pt currently has Boost Breeze ordered to aid in caloric and protein needs. RD to additionally order Prostat to aid in increased protein.   Labs and medications reviewed. JP drain output: 100 ml.   Diet Order:   Diet Order            Diet clear liquid Room service appropriate? Yes; Fluid consistency: Thin  Diet  effective now              EDUCATION NEEDS:   No education needs have been identified at this time  Skin:  Skin Assessment: Skin Integrity Issues: Skin Integrity Issues:: Incisions Incisions: abdomen, L arm  Last BM:  2/2 colostomy  Height:   Ht Readings from Last 1 Encounters:  01/02/20 5\' 11"  (1.803 m)    Weight:   Wt Readings from Last 1 Encounters:  01/02/20 65.8 kg    Ideal Body Weight:  78.2 kg  BMI:  Body mass index is 20.22 kg/m.  Estimated Nutritional Needs:   Kcal:  1840-2105 kcal  Protein:  92-105 grams  Fluid:  >/= 2 L/day    Corrin Parker, MS, RD, LDN Pager # [phone] After hours/ weekend pager # [phone]

## 2020-01-07 NOTE — Plan of Care (Signed)
  Problem: Education: Goal: Knowledge of risk factors and measures for prevention of condition will improve Outcome: Progressing   Problem: Coping: Goal: Psychosocial and spiritual needs will be supported Outcome: Progressing   Problem: Respiratory: Goal: Will maintain a patent airway Outcome: Progressing Goal: Complications related to the disease process, condition or treatment will be avoided or minimized Outcome: Progressing   Problem: Safety: Goal: Ability to remain free from injury will improve Outcome: Progressing Note: Anthony Curry has remained free from falls and injuries this shift.

## 2020-01-08 ENCOUNTER — Inpatient Hospital Stay (HOSPITAL_COMMUNITY): Payer: Medicare Other

## 2020-01-08 DIAGNOSIS — I313 Pericardial effusion (noninflammatory): Secondary | ICD-10-CM

## 2020-01-08 LAB — BASIC METABOLIC PANEL
Anion gap: 9 (ref 5–15)
BUN: 24 mg/dL — ABNORMAL HIGH (ref 8–23)
CO2: 23 mmol/L (ref 22–32)
Calcium: 7.6 mg/dL — ABNORMAL LOW (ref 8.9–10.3)
Chloride: 100 mmol/L (ref 98–111)
Creatinine, Ser: 0.7 mg/dL (ref 0.61–1.24)
GFR calc Af Amer: >60 mL/min (ref >60)
GFR calc non Af Amer: >60 mL/min (ref >60)
Glucose, Bld: 95 mg/dL (ref 70–99)
Potassium: 3.4 mmol/L — ABNORMAL LOW (ref 3.5–5.1)
Sodium: 132 mmol/L — ABNORMAL LOW (ref 135–145)

## 2020-01-08 LAB — C-REACTIVE PROTEIN: CRP: 10.6 mg/dL — ABNORMAL HIGH (ref <1.0)

## 2020-01-08 LAB — SURGICAL PATHOLOGY

## 2020-01-08 LAB — CBC
HCT: 32.2 % — ABNORMAL LOW (ref 39.0–52.0)
Hemoglobin: 11.5 g/dL — ABNORMAL LOW (ref 13.0–17.0)
MCH: 31.1 pg (ref 26.0–34.0)
MCHC: 35.7 g/dL (ref 30.0–36.0)
MCV: 87 fL (ref 80.0–100.0)
Platelets: 277 10*3/uL (ref 150–400)
RBC: 3.7 MIL/uL — ABNORMAL LOW (ref 4.22–5.81)
RDW: 14 % (ref 11.5–15.5)
WBC: 9.9 10*3/uL (ref 4.0–10.5)
nRBC: 0 % (ref 0.0–0.2)

## 2020-01-08 LAB — ECHOCARDIOGRAM LIMITED
Height: 71 in
Weight: 2320 oz

## 2020-01-08 LAB — CULTURE, BLOOD (ROUTINE X 2)
Culture: NO GROWTH
Culture: NO GROWTH
Special Requests: ADEQUATE
Special Requests: ADEQUATE

## 2020-01-08 LAB — FERRITIN: Ferritin: 377 ng/mL — ABNORMAL HIGH (ref 24–336)

## 2020-01-08 LAB — HEPARIN LEVEL (UNFRACTIONATED): Heparin Unfractionated: 0.46 IU/mL (ref 0.30–0.70)

## 2020-01-08 LAB — APTT: aPTT: 84 seconds — ABNORMAL HIGH (ref 24–36)

## 2020-01-08 LAB — D-DIMER, QUANTITATIVE: D-Dimer, Quant: 2.31 ug/mL-FEU — ABNORMAL HIGH (ref 0.00–0.50)

## 2020-01-08 MED ORDER — ENSURE ENLIVE PO LIQD
237.0000 mL | Freq: Two times a day (BID) | ORAL | Status: DC
  Administered 2020-01-08 – 2020-01-10 (×4): 237 mL via ORAL

## 2020-01-08 MED ORDER — POTASSIUM CHLORIDE CRYS ER 20 MEQ PO TBCR
40.0000 meq | EXTENDED_RELEASE_TABLET | Freq: Once | ORAL | Status: AC
  Administered 2020-01-08: 10:00:00 40 meq via ORAL
  Filled 2020-01-08: qty 2

## 2020-01-08 MED ORDER — LEVOTHYROXINE SODIUM 50 MCG PO TABS
50.0000 ug | ORAL_TABLET | Freq: Every day | ORAL | Status: DC
  Administered 2020-01-09 – 2020-01-10 (×2): 50 ug via ORAL
  Filled 2020-01-08 (×2): qty 1

## 2020-01-08 NOTE — NC FL2 (Signed)
[place] LEVEL OF CARE SCREENING TOOL     IDENTIFICATION  Patient Name: Anthony Curry. Birthdate: 11-04-1941 Sex: male Admission Date (Current Location): 01/02/2020  [facility] and [place] Number:  Herbalist and Address:  [facility], [place] [address] [place], [place] [address]      Provider Number: [id number]  Attending Physician Name and Address:  Jonetta Osgood, MD  Relative Name and Phone Number:  [name], daughter [phone]    Current Level of Care: Hospital Recommended Level of Care: [place] Prior Approval Number:    Date Approved/Denied:   PASRR Number:    Discharge Plan: SNF    Current Diagnoses: Patient Active Problem List   Diagnosis Date Noted  . Diverticulitis 01/02/2020  . Acute diverticulitis 01/02/2020  . Ataxia 06/30/2019  . SIADH (syndrome of inappropriate ADH production) ([place]) 06/30/2019  . Generalized weakness 06/28/2019  . Lactic acidosis 06/28/2019  . Abnormal urinalysis 06/28/2019  . Hypertension 12/07/2016  . 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 Acute urinary retention 09/09/2015  . Choledocholithiasis with cholecystitis 09/08/2015  . Common bile duct stone   . Encounter for antineoplastic immunotherapy 06/28/2015  . Atrial fibrillation ([place]) 06/11/2015  . Gynecomastia, male 06/10/2015  . Dizziness 05/11/2015  . Malignant neoplasm of lower lobe of right lung ([place]) 09/17/2014  . Neutropenic fever ([place]) 09/02/2014  . Neoplastic malignant related fatigue 08/13/2014  . Physical deconditioning 08/13/2014  . Hypoalbuminemia 08/13/2014  . Diarrhea 08/13/2014  . Antineoplastic chemotherapy induced anemia(285.3) 08/13/2014  . Atrial fibrillation with RVR ([place]) 07/28/2014  . HCAP (healthcare-associated pneumonia) 07/25/2014  . Alcoholism in remission ([place]) 07/25/2014  . Atrial flutter by electrocardiogram ([place]) 07/25/2014  . Swelling of limb 06/10/2014  .  Lung cancer ([place]) 10/15/2013  . Hypotension 09/10/2013  . Adrenal mass ([place]) 09/09/2013  . 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 Pulmonary nodules 08/19/2013  . Anemia of chronic disease 07/03/2013  . BPH (benign prostatic hyperplasia) 07/03/2013  . GERD (gastroesophageal reflux disease) 07/03/2013  . Fall 07/03/2013  . Alcohol intoxication ([place]) 07/03/2013  . 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 Alcohol abuse 10/15/2011  . Hyponatremia 10/15/2011    Orientation RESPIRATION BLADDER Height & Weight     Self, Time, Situation, Place  O2(Nasal Cannula) Continent, External catheter Weight: 145 lb (65.8 kg) Height:  5\' 11"  (180.3 cm)  BEHAVIORAL SYMPTOMS/MOOD NEUROLOGICAL BOWEL NUTRITION STATUS      Continent Diet(see d/c plan)  AMBULATORY STATUS COMMUNICATION OF NEEDS Skin   Extensive Assist Verbally Normal                       Personal Care Assistance Level of Assistance  Bathing, Dressing, Feeding Bathing Assistance: Maximum assistance Feeding assistance: Limited assistance Dressing Assistance: Limited assistance     Functional Limitations Info             SPECIAL CARE FACTORS FREQUENCY  PT (By licensed PT), OT (By licensed OT)     PT Frequency: 5x a week OT Frequency: 5x a week            Contractures Contractures Info: Not present    Additional Factors Info  Code Status, Allergies, Isolation Precautions Code Status Info: Full Allergies Info: KNA     Isolation Precautions Info: Covid+     Current Medications (01/08/2020):  This is the current hospital active medication list Current Facility-Administered Medications  Medication Dose Route Frequency Provider Last Rate Last Admin  .  0.9 %  sodium chloride infusion   Intravenous PRN Rai, Ripudeep K, MD 10 mL/hr at 01/06/20 2225 250 mL at 01/06/20 2225  . acetaminophen (TYLENOL) tablet 650 mg  650 mg Oral Q6H PRN Rai, Ripudeep K, MD   650 mg at 01/05/20 0405  . albuterol (VENTOLIN HFA) 108  (90 Base) MCG/ACT inhaler 2 puff  2 puff Inhalation Q4H PRN Rai, Ripudeep K, MD      . ALPRAZolam Duanne Moron) tablet 0.5 mg  0.5 mg Oral QHS PRN Rai, Ripudeep K, MD   0.5 mg at 01/08/20 0045  . ascorbic acid (VITAMIN C) tablet 500 mg  500 mg Oral BID Rai, Ripudeep K, MD   500 mg at 01/08/20 0931  . chlorhexidine (PERIDEX) 0.12 % solution 15 mL  15 mL Mouth Rinse BID Rai, Ripudeep K, MD   15 mL at 01/07/20 2149  . Chlorhexidine Gluconate Cloth 2 % PADS 6 each  6 each Topical Daily Rai, Ripudeep K, MD   6 each at 01/07/20 1742  . demeclocycline (DECLOMYCIN) tablet 150 mg  150 mg Oral BID Rai, Ripudeep K, MD   150 mg at 01/08/20 0947  . diltiazem (CARDIZEM CD) 24 hr capsule 180 mg  180 mg Oral Q1200 Rai, Ripudeep K, MD   180 mg at 01/07/20 1324  . feeding supplement (BOOST / RESOURCE BREEZE) liquid 1 Container  1 Container Oral TID BM Rai, Ripudeep K, MD      . feeding supplement (PRO-STAT SUGAR FREE 64) liquid 30 mL  30 mL Oral BID Hosie Poisson, MD   30 mL at 01/08/20 0931  . fluticasone (FLONASE) 50 MCG/ACT nasal spray 2 spray  2 spray Each Nare QHS Rai, Ripudeep K, MD   2 spray at 01/07/20 2148  . heparin ADULT infusion 100 units/mL (25000 units/263mL sodium chloride 0.45%)  900 Units/hr Intravenous Continuous Erenest Blank, RPH 9 mL/hr at 01/07/20 2259 900 Units/hr at 01/07/20 2259  . hydrALAZINE (APRESOLINE) injection 10 mg  10 mg Intravenous Q6H PRN Rai, Ripudeep K, MD      . HYDROmorphone (DILAUDID) injection 1 mg  1 mg Intravenous Q3H PRN Hosie Poisson, MD   1 mg at 01/07/20 2252  . Ipratropium-Albuterol (COMBIVENT) respimat 1 puff  1 puff Inhalation Q6H Rai, Ripudeep K, MD   1 puff at 01/08/20 0931  . levothyroxine (SYNTHROID, LEVOTHROID) injection 25 mcg  25 mcg Intravenous Daily Rai, Ripudeep K, MD   25 mcg at 01/08/20 0931  . MEDLINE mouth rinse  15 mL Mouth Rinse q12n4p Rai, Ripudeep K, MD   15 mL at 01/07/20 1743  . mirtazapine (REMERON) tablet 30 mg  30 mg Oral QHS Rai, Ripudeep K, MD    30 mg at 01/07/20 2149  . mometasone-formoterol (DULERA) 100-5 MCG/ACT inhaler 2 puff  2 puff Inhalation BID Rai, Ripudeep K, MD   2 puff at 01/08/20 0932  . ondansetron (ZOFRAN) injection 4 mg  4 mg Intravenous Q6H PRN Rai, Ripudeep K, MD      . oxyCODONE (Oxy IR/ROXICODONE) immediate release tablet 5 mg  5 mg Oral Q6H PRN Rai, Ripudeep K, MD   5 mg at 01/08/20 0650  . piperacillin-tazobactam (ZOSYN) IVPB 3.375 g  3.375 g Intravenous Q8H Rai, Ripudeep K, MD 12.5 mL/hr at 01/08/20 0418 3.375 g at 01/08/20 0418  . remdesivir 100 mg in sodium chloride 0.9 % 100 mL IVPB  100 mg Intravenous Daily Rai, Ripudeep K, MD 200 mL/hr at 01/08/20 0939 100 mg at  01/08/20 0939  . sodium chloride 0.9 % bolus 1,000 mL  1,000 mL Intravenous Once Mendel Corning, MD   Stopped at 01/05/20 2120  . tamsulosin (FLOMAX) capsule 0.4 mg  0.4 mg Oral BID Rai, Ripudeep K, MD   0.4 mg at 01/08/20 0931  . thiamine tablet 100 mg  100 mg Oral Daily Rai, Ripudeep K, MD   100 mg at 01/08/20 0930  . zinc sulfate capsule 220 mg  220 mg Oral Daily Rai, Ripudeep K, MD   220 mg at 01/08/20 6825     Discharge Medications: Please see discharge summary for a list of discharge medications.  Relevant Imaging Results:  Relevant Lab Results:   Additional Robinson, LCSW

## 2020-01-08 NOTE — Anesthesia Postprocedure Evaluation (Signed)
Anesthesia Post Note  Patient: Anthony Curry.  Procedure(s) Performed: Embolectomy left axillary, brachial, radial, and ulnar artery (Left Arm Lower)     Anesthesia Post Evaluation  Last Vitals:  Vitals:   01/08/20 0900 01/08/20 1300  BP: 134/78 (!) 157/66  Pulse:  67  Resp:  20  Temp: 36.4 C 36.5 C  SpO2:  97%    Last Pain:  Vitals:   01/08/20 1300  TempSrc: Oral  PainSc:                  Hedi Barkan

## 2020-01-08 NOTE — Progress Notes (Signed)
  Echocardiogram 2D Echocardiogram has been performed.  Jennette Dubin 01/08/2020, 2:42 PM

## 2020-01-08 NOTE — Progress Notes (Signed)
Patient ID: Anthony Curry., male   DOB: 31-Aug-1941, 79 y.o.   MRN: [id number]    2 Days Post-Op  Subjective: Patient has some abdominal pain but mostly when he coughs.  Tolerating clear liquids but is hungry and wants more to eat, like grits.  Got up to the bedside with PT yesterday.  Denies SOB.  ROS: See above, otherwise other systems negative  Objective: Vital signs in last 24 hours: Temp:  [97.7 F (36.5 C)-98.7 F (37.1 C)] 97.9 F (36.6 C) (02/03 0420) Pulse Rate:  [65-69] 65 (02/03 0420) Resp:  [16-22] 19 (02/03 0420) BP: (140-149)/(55-74) 149/74 (02/03 0420) SpO2:  [95 %-96 %] 96 % (02/03 0420) Last BM Date: 01/06/20  Intake/Output from previous day: 02/02 0701 - 02/03 0700 In: 583 [I.V.:170.1; IV Piggyback:392.9] Out: 1201 [Urine:1125; Drains:75; Stool:1] Intake/Output this shift: No intake/output data recorded.  PE: Heart: difficult to hear with yellow stethoscope.  Monitor shows sinus brady Lungs: CTAB Abd: soft, appropriately tender, midline wound is clean and packed.  Colostomy with a lot of air present.  No stool currently, but stoma is pink and viable.  JP is serous, 75 cc of output  Lab Results:  Recent Labs    01/07/20 0421 01/08/20 0505  WBC 13.4* 9.9  HGB 11.2* 11.5*  HCT 32.4* 32.2*  PLT 273 277   BMET Recent Labs    01/07/20 0421 01/08/20 0505  NA 134* 132*  K 3.8 3.4*  CL 104 100  CO2 24 23  GLUCOSE 109* 95  BUN 27* 24*  CREATININE 0.79 0.70  CALCIUM 8.0* 7.6*   PT/INR No results for input(s): LABPROT, INR in the last 72 hours. CMP     Component Value Date/Time   NA 132 (L) 01/08/2020 0505   NA 132 (L) 12/06/2017 0930   K 3.4 (L) 01/08/2020 0505   K 4.0 12/06/2017 0930   CL 100 01/08/2020 0505   CO2 23 01/08/2020 0505   CO2 26 12/06/2017 0930   GLUCOSE 95 01/08/2020 0505   GLUCOSE 89 12/06/2017 0930   BUN 24 (H) 01/08/2020 0505   BUN 15.4 12/06/2017 0930   CREATININE 0.70 01/08/2020 0505   CREATININE 0.97 12/02/2019  1134   CREATININE 0.9 12/06/2017 0930   CALCIUM 7.6 (L) 01/08/2020 0505   CALCIUM 9.9 12/06/2017 0930   PROT 4.9 (L) 01/05/2020 0340   PROT 6.3 (L) 12/06/2017 0930   ALBUMIN 2.3 (L) 01/05/2020 0340   ALBUMIN 3.7 12/06/2017 0930   AST 21 01/05/2020 0340   AST 17 12/02/2019 1134   AST 20 12/06/2017 0930   ALT 16 01/05/2020 0340   ALT 11 12/02/2019 1134   ALT 14 12/06/2017 0930   ALKPHOS 64 01/05/2020 0340   ALKPHOS 107 12/06/2017 0930   BILITOT 1.0 01/05/2020 0340   BILITOT 0.6 12/02/2019 1134   BILITOT 0.76 12/06/2017 0930   GFRNONAA >60 01/08/2020 0505   GFRNONAA >60 12/02/2019 1134   GFRAA >60 01/08/2020 0505   GFRAA >60 12/02/2019 1134   Lipase     Component Value Date/Time   LIPASE 19 01/02/2020 1041       Studies/Results: VAS [place] UPPER EXTREMITY ARTERIAL DUPLEX  Result Date: 01/06/2020 UPPER EXTREMITY DUPLEX STUDY Indications: Patient complains of cool left upper extremity with pallor. History:     Patient has a history ofrecent left upper extremity arterial line.  Comparison Study: No prior study. Performing Technologist: Maudry Mayhew MHA, RDMS, RVT, RDCS  Examination Guidelines: A complete evaluation includes B-mode  imaging, spectral Doppler, color Doppler, and power Doppler as needed of all accessible portions of each vessel. Bilateral testing is considered an integral part of a complete examination. Limited examinations for reoccurring indications may be performed as noted.  Right Doppler Findings: +---------------+----------+--------+--------+--------+ Site           PSV (cm/s)WaveformStenosisComments +---------------+----------+--------+--------+--------+ Subclavian Prox134       biphasic                 +---------------+----------+--------+--------+--------+  Left Doppler Findings: +---------------+----------+--------------+--------+--------+ Site           PSV (cm/s)Waveform      StenosisComments  +---------------+----------+--------------+--------+--------+ Subclavian Prox17        monophasic                     +---------------+----------+--------------+--------+--------+ Subclavian Mid 12        monophasic                     +---------------+----------+--------------+--------+--------+ Axillary       15.00     monophasic                     +---------------+----------+--------------+--------+--------+ Brachial Prox  14        monophasic                     +---------------+----------+--------------+--------+--------+ Brachial Dist  11        monophasic                     +---------------+----------+--------------+--------+--------+ Radial Prox    4         barely audible                 +---------------+----------+--------------+--------+--------+ Radial Dist              barely audible                 +---------------+----------+--------------+--------+--------+ Ulnar Dist     10        barely audible                 +---------------+----------+--------------+--------+--------+ Palmar Arch    6         barely audible                 +---------------+----------+--------------+--------+--------+   Summary:  Right: No obstruction visualized in the right subclavian artery. Left: Monophasic flow at the proximal left subclavian artery is       suggestive of proximal obstruction. Radial, ulnar, and palmar       arch signals are patent, however signals are barely       detectable. *See table(s) above for measurements and observations. Electronically signed by Ruta Hinds MD on 01/06/2020 at 5:06:56 PM.    Final    HYBRID OR IMAGING ([place])  Result Date: 01/06/2020 There is no interpretation for this exam.  This order is for images obtained during a surgical procedure.  Please See "Surgeries" Tab for more information regarding the procedure.    Anti-infectives: Anti-infectives (From admission, onward)   Start     Dose/Rate Route Frequency Ordered  Stop   01/06/20 1000  remdesivir 100 mg in sodium chloride 0.9 % 100 mL IVPB     100 mg 200 mL/hr over 30 Minutes Intravenous Daily 01/05/20 0846 01/10/20 0959   01/05/20 1000  remdesivir 200 mg in sodium chloride 0.9% 250 mL IVPB     200  mg 580 mL/hr over 30 Minutes Intravenous Once 01/05/20 0846 01/05/20 1002   01/02/20 2000  piperacillin-tazobactam (ZOSYN) IVPB 3.375 g     3.375 g 12.5 mL/hr over 240 Minutes Intravenous Every 8 hours 01/02/20 1415     01/02/20 1500  demeclocycline (DECLOMYCIN) tablet 150 mg    Note to Pharmacy: Take 2 tablets twice a day     150 mg Oral 2 times daily 01/02/20 1408     01/02/20 1345  piperacillin-tazobactam (ZOSYN) IVPB 3.375 g     3.375 g 100 mL/hr over 30 Minutes Intravenous  Once 01/02/20 1331 01/02/20 1528       Assessment/Plan Acute hypoxic respiratory failure due to acute COVID-19 viral pneumonia History of stage IV non-small cell lung CA- undergoing chemo treatment, on hold Paroxysmal atrial fibrillation- eliquis on hold, last dose 01/31 am, on heparin gtt for cold left hand Mild protein calorie malnutrition- albumin 2.3 Hematuria - per uro/primary Ischemic left hand, s/p embolectomy - on heparin gtt Pericardial effusion - per primary  POD 3, S/Pexploratory laparotomy, sigmoid resection with end colostomy creation, JP drain, Dr. Kieth Brightly, 01/31 for Acute diverticulitis with perforation -FLD -WOC RN consult for new stoma - ambulate, IS -cont JP drain -BID WD dressing changes to midline wound -cont zosyn   FEN:FLD VTE: SCD's,heparin gtt DH:RCBUL 01/28>>Remdesivir 01/31>> Foley:yes, ok to remove from surgery standpoint.  Will defer to primary/uro given hematuria Follow up:Dr. Kinsinger   LOS: 6 days    Henreitta Cea , [facility] Surgery 01/08/2020, 9:09 AM Please see Amion for pager number during day hours 7:00am-4:30pm or 7:00am -11:30am on weekends

## 2020-01-08 NOTE — Progress Notes (Addendum)
PROGRESS NOTE                                                                                                                                                                                                             Patient Demographics:    Anthony Curry, is a 79 y.o. male, DOB - [birth year], XQJ:[id number]  Outpatient Primary MD for the patient is [name], MD   Admit date - 01/02/2020   LOS - 6  Chief Complaint  Patient presents with  . Abdominal Pain       Brief Narrative: Patient is a 79 y.o. 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  Subsequent hospital course was complicated by ischemic left hand-patient underwent left brachial embolectomy on 2/1.  See below for further details   Subjective:    Anthony Curry today was lying comfortably in bed-he did not have any major complaints-no shortness of breath or chest pain.   Assessment  & Plan :   Acute diverticulitis with perforation-s/p exploratory laparotomy with sigmoid resection and colostomy creation on 1/31: General surgery following-diet being advanced-JP drain in place-on IV Zosyn.  Antibiotics: Zosyn: 1/28>>  Ischemia to left hand-s/p left subclavian axillary brachial radial ulnar embolectomy on 2/1: Vascular surgery has signed off-remains on IV heparin.  Recent echo on 01/02/2020 with preserved EF without any embolic source-but patient does have a history of PAF.  Acute Hypoxic Resp Failure due to Covid 19 Viral pneumonia: Improving-continue remdesivir-continued attempts to titrate off oxygen.  Fever: afebrile  O2  requirements:  SpO2: 97 % O2 Flow Rate (L/min): 1.5 L/min   COVID-19 Labs: Recent Labs    01/06/20 0540 01/07/20 0421 01/08/20 0505  DDIMER 5.30* 2.55* 2.31*  FERRITIN 211 348* 377*  CRP 21.5* 16.8* 10.6*       Component Value Date/Time   BNP 109.6 (H) 01/05/2020 1119    No results for input(s): PROCALCITON in the last 168 hours.  Lab Results  Component Value Date   [place] (A) 01/02/2020   SARSCOV2NAA Detected (A) 01/01/2020   [place] Not Detected 12/27/2019   [place] Not Detected 11/25/2019  COVID-19 Medications: Remdesivir: 1/31>> Convalescent Plasma: 1/31 x 1  Prone/Incentive Spirometry: encouraged  incentive spirometry use 3-4/hour.  DVT Prophylaxis  : IV heparin  Pericardial effusion: Previously seen by cardiology with recommendations for outpatient follow-up-repeat echocardiogram pending.  PAF: Continue Cardizem-on IV heparin-once echocardiogram shows stability of pericardial effusion-we will plan to switch to oral anticoagulation.  History of SIADH: Likely secondary to underlying lung cancer-sodium level stable-on demeclocycline  Hypokalemia: Replete and recheck  Hypothyroidism: Continue Synthroid-change back to oral route  History of non-small cell cancer of the lung: Follows with Dr. Earlie Server  Deconditioning/debility: Secondary to acute illness-evaluated by PT services-recommendations are for home health on discharge.  Nutrition Problem: Nutrition Problem: Increased nutrient needs Etiology: acute illness, catabolic KNLZJQB(HALPF-79 infection) Signs/Symptoms: estimated needs Interventions: Refer to RD note for recommendations  Obesity: Estimated body mass index is 20.22 kg/m as calculated from the following:   Height as of this encounter: 5\' 11"  (1.803 m).   Weight as of this encounter: 65.8 kg.   Consults  : CCS, cardiology  Procedures  : See above  ABG:    Component Value Date/Time   HCO3 25.5 (H) 05/29/2007  1403   TCO2 27 05/29/2007 1403    Vent Settings: N/A  Condition - Guarded  Family Communication  :  Daughter updated over the phone  Code Status :  Full Code  Diet :  Diet Order            Diet full liquid Room service appropriate? Yes; Fluid consistency: Thin  Diet effective now               Disposition Plan  :  Remain hospitalized-Home with home health services vs SNF over the next few days-depending on clinical progress.  Barriers to discharge:complete 5 days of IV Remdesivir/limited echo to assess pericardial effusion  Antimicorbials  :    Anti-infectives (From admission, onward)   Start     Dose/Rate Route Frequency Ordered Stop   01/06/20 1000  remdesivir 100 mg in sodium chloride 0.9 % 100 mL IVPB     100 mg 200 mL/hr over 30 Minutes Intravenous Daily 01/05/20 0846 01/10/20 0959   01/05/20 1000  remdesivir 200 mg in sodium chloride 0.9% 250 mL IVPB     200 mg 580 mL/hr over 30 Minutes Intravenous Once 01/05/20 0846 01/05/20 1002   01/02/20 2000  piperacillin-tazobactam (ZOSYN) IVPB 3.375 g     3.375 g 12.5 mL/hr over 240 Minutes Intravenous Every 8 hours 01/02/20 1415     01/02/20 1500  demeclocycline (DECLOMYCIN) tablet 150 mg    Note to Pharmacy: Take 2 tablets twice a day     150 mg Oral 2 times daily 01/02/20 1408     01/02/20 1345  piperacillin-tazobactam (ZOSYN) IVPB 3.375 g     3.375 g 100 mL/hr over 30 Minutes Intravenous  Once 01/02/20 1331 01/02/20 1528      Inpatient Medications  Scheduled Meds: . vitamin C  500 mg Oral BID  . chlorhexidine  15 mL Mouth Rinse BID  . Chlorhexidine Gluconate Cloth  6 each Topical Daily  . demeclocycline  150 mg Oral BID  . diltiazem  180 mg Oral Q1200  . feeding supplement (ENSURE ENLIVE)  237 mL Oral BID BM  . feeding supplement (PRO-STAT SUGAR FREE 64)  30 mL Oral BID  . fluticasone  2 spray Each Nare QHS  . Ipratropium-Albuterol  1 puff Inhalation Q6H  . levothyroxine  25 mcg Intravenous Daily  . mouth  rinse  15 mL Mouth Rinse q12n4p  . mirtazapine  30 mg Oral QHS  . mometasone-formoterol  2 puff Inhalation BID  . tamsulosin  0.4 mg Oral BID  . thiamine  100 mg Oral Daily  . zinc sulfate  220 mg Oral Daily   Continuous Infusions: . sodium chloride 250 mL (01/06/20 2225)  . heparin 900 Units/hr (01/07/20 2259)  . piperacillin-tazobactam (ZOSYN)  IV 3.375 g (01/08/20 1311)  . remdesivir 100 mg in NS 100 mL 100 mg (01/08/20 0939)  . sodium chloride Stopped (01/05/20 2120)   PRN Meds:.sodium chloride, acetaminophen, albuterol, ALPRAZolam, hydrALAZINE, HYDROmorphone (DILAUDID) injection, ondansetron (ZOFRAN) IV, oxyCODONE   Time Spent in minutes  25  See all Orders from today for further details   Oren Binet M.D on 01/08/2020 at 2:57 PM  To page go to [email or web address] - use universal password  [facility] -  Office  [phone]    Objective:   Vitals:   01/08/20 0040 01/08/20 0420 01/08/20 0900 01/08/20 1300  BP: (!) 142/58 (!) 149/74 134/78 (!) 157/66  Pulse: 69 65  67  Resp: 18 19  20   Temp: 98.7 F (37.1 C) 97.9 F (36.6 C) 97.6 F (36.4 C) 97.7 F (36.5 C)  TempSrc: Oral Oral Oral Oral  SpO2: 95% 96%  97%  Weight:      Height:        Wt Readings from Last 3 Encounters:  01/02/20 65.8 kg  12/02/19 66.6 kg  10/29/19 65 kg     Intake/Output Summary (Last 24 hours) at 01/08/2020 1457 Last data filed at 01/08/2020 0700 Gross per 24 hour  Intake 582.97 ml  Output 1181 ml  Net -598.03 ml     Physical Exam Gen Exam:Alert awake-not in any distress HEENT:atraumatic, normocephalic Chest: B/L clear to auscultation anteriorly CVS:S1S2 regular Abdomen: Soft-appropriately tender at the operative site-ostomy present. Extremities:no edema Neurology: Non focal Skin: no rash   Data Review:    CBC Recent Labs  Lab 01/02/20 1041 01/03/20 0330 01/05/20 0340 01/05/20 2035 01/06/20 0540 01/07/20 0421 01/08/20 0505  WBC 8.5   < > 12.8* 14.9* 13.8*  13.4* 9.9  HGB 14.6   < > 13.4 12.0* 12.1* 11.2* 11.5*  HCT 42.4   < > 39.2 34.7* 35.6* 32.4* 32.2*  PLT 222   < > 242 227 220 273 277  MCV 89.1   < > 90.3 88.7 91.8 89.3 87.0  MCH 30.7   < > 30.9 30.7 31.2 30.9 31.1  MCHC 34.4   < > 34.2 34.6 34.0 34.6 35.7  RDW 13.9   < > 14.4 14.1 14.3 14.2 14.0  LYMPHSABS 0.8  --   --  0.1*  --   --   --   MONOABS 0.7  --   --  0.6  --   --   --   EOSABS 0.0  --   --  0.0  --   --   --   BASOSABS 0.0  --   --  0.0  --   --   --    < > = values in this interval not displayed.    Chemistries  Recent Labs  Lab 01/02/20 1041 01/02/20 1041 01/03/20 0330 01/03/20 0330 01/04/20 0225 01/05/20 0340 01/06/20 0540 01/07/20 0421 01/08/20 0505  NA 128*   < > 131*   < > 131* 130* 132* 134* 132*  K 4.2   < > 4.3   < > 3.7 3.3* 4.1 3.8  3.4*  CL 93*   < > 98   < > 100 101 102 104 100  CO2 26   < > 23   < > 21* 21* 21* 24 23  GLUCOSE 127*   < > 146*   < > 107* 83 148* 109* 95  BUN 15   < > 16   < > 20 18 20  27* 24*  CREATININE 0.88   < > 0.82   < > 0.81 0.72 0.73 0.79 0.70  CALCIUM 9.0   < > 8.7*   < > 8.3* 7.7* 8.1* 8.0* 7.6*  AST 26  --  23  --  25 21  --   --   --   ALT 21  --  18  --  15 16  --   --   --   ALKPHOS 88  --  71  --  63 64  --   --   --   BILITOT 1.4*  --  0.9  --  1.3* 1.0  --   --   --    < > = values in this interval not displayed.   ------------------------------------------------------------------------------------------------------------------ No results for input(s): CHOL, HDL, LDLCALC, TRIG, CHOLHDL, LDLDIRECT in the last 72 hours.  No results found for: HGBA1C ------------------------------------------------------------------------------------------------------------------ No results for input(s): TSH, T4TOTAL, T3FREE, THYROIDAB in the last 72 hours.  Invalid input(s): FREET3 ------------------------------------------------------------------------------------------------------------------ Recent Labs    01/07/20 0421  01/08/20 0505  FERRITIN 348* 377*    Coagulation profile No results for input(s): INR, PROTIME in the last 168 hours.  Recent Labs    01/07/20 0421 01/08/20 0505  DDIMER 2.55* 2.31*    Cardiac Enzymes No results for input(s): CKMB, TROPONINI, MYOGLOBIN in the last 168 hours.  Invalid input(s): CK ------------------------------------------------------------------------------------------------------------------    Component Value Date/Time   BNP 109.6 (H) 01/05/2020 1119    Micro Results Recent Results (from the past 240 hour(s))  Novel Coronavirus, NAA ([facility])     Status: Abnormal   Collection Time: 01/01/20  2:24 PM   Specimen: Nasopharyngeal(NP) swabs in vial transport medium   NASOPHARYNGE  TESTING  Result Value Ref Range Status   SARS-CoV-2, NAA Detected (A) Not Detected Final    Comment: This nucleic acid amplification test was developed and its performance characteristics determined by [employer]. Nucleic acid amplification tests include RT-PCR and TMA. This test has not been FDA cleared or approved. This test has been authorized by FDA under an Emergency Use Authorization (EUA). This test is only authorized for the duration of time the declaration that circumstances exist justifying the authorization of the emergency use of in vitro diagnostic tests for detection of SARS-CoV-2 virus and/or diagnosis of COVID-19 infection under section 564(b)(1) of the Act, 21 U.S.C. 623JSE-8(B) (1), unless the authorization is terminated or revoked sooner. When diagnostic testing is negative, the possibility of a false negative result should be considered in the context of a patient's recent exposures and the presence of clinical signs and symptoms consistent with COVID-19. An individual without symptoms of COVID-19 and who is not shedding SARS-CoV-2 virus wo uld expect to have a negative (not detected) result in this assay.   SARS CORONAVIRUS 2 (TAT 6-24 HRS)  Nasopharyngeal Nasopharyngeal Swab     Status: Abnormal   Collection Time: 01/02/20  2:29 PM   Specimen: Nasopharyngeal Swab  Result Value Ref Range Status   SARS Coronavirus 2 POSITIVE (A) NEGATIVE Final    Comment: RESULT CALLED TO, READ BACK BY  AND VERIFIED WITH: T BAKER,RN 2015 01/02/2020 D BRADLEY (NOTE) SARS-CoV-2 target nucleic acids are DETECTED. The SARS-CoV-2 RNA is generally detectable in upper and lower respiratory specimens during the acute phase of infection. Positive results are indicative of the presence of SARS-CoV-2 RNA. Clinical correlation with patient history and other diagnostic information is  necessary to determine patient infection status. Positive results do not rule out bacterial infection or co-infection with other viruses.  The expected result is Negative. Fact Sheet for Patients: SugarRoll.be Fact Sheet for Healthcare Providers: https://www.woods-mathews.com/ This test is not yet approved or cleared by the [place] FDA and  has been authorized for detection and/or diagnosis of SARS-CoV-2 by FDA under an Emergency Use Authorization (EUA). This EUA will remain  in effect (meaning this test can be used) for the  duration of the COVID-19 declaration under Section 564(b)(1) of the Act, 21 U.S.C. section 360bbb-3(b)(1), unless the authorization is terminated or revoked sooner. Performed at [facility], [place] [address], [place], [place] [address]   Culture, blood (routine x 2)     Status: None   Collection Time: 01/03/20 10:10 PM   Specimen: BLOOD  Result Value Ref Range Status   Specimen Description   Final    BLOOD RIGHT ANTECUBITAL Performed at [facility], [place] [address] [place], [place] [address]    Special Requests   Final    BOTTLES DRAWN AEROBIC AND ANAEROBIC Blood Culture adequate volume Performed at [facility] [address], [place], [place] [address]    Culture    Final    NO GROWTH 5 DAYS Performed at [facility], [place] [address], [place], [place] [address]    Report Status 01/08/2020 FINAL  Final  Culture, blood (routine x 2)     Status: None   Collection Time: 01/03/20 10:10 PM   Specimen: BLOOD  Result Value Ref Range Status   Specimen Description   Final    BLOOD LEFT HAND Performed at [facility] [address], [place], [place] [address]    Special Requests   Final    BOTTLES DRAWN AEROBIC AND ANAEROBIC Blood Culture adequate volume Performed at [facility] [address] [place] [address] [place], [place] [address]    Culture   Final    NO GROWTH 5 DAYS Performed at [facility], [place] [address] [place], [place] [address]    Report Status 01/08/2020 FINAL  Final  Surgical pcr screen     Status: None   Collection Time: 01/05/20  2:45 PM   Specimen: Nasal Mucosa; Nasal Swab  Result Value Ref Range Status   MRSA, PCR NEGATIVE NEGATIVE Final   Staphylococcus aureus NEGATIVE NEGATIVE Final    Comment: (NOTE) The Xpert SA Assay (FDA approved for NASAL specimens in patients 14 years of age and older), is one component of a comprehensive surveillance program. It is not intended to diagnose infection nor to guide or monitor treatment. Performed at [facility], [place] [address], [place], [place] [address]   Culture, blood (routine x 2)     Status: None (Preliminary result)   Collection Time: 01/06/20  8:15 AM   Specimen: BLOOD RIGHT HAND  Result Value Ref Range Status   Specimen Description   Final    BLOOD RIGHT HAND Performed at [facility] [address] [place], [place] [address]    Special Requests   Final    BOTTLES DRAWN  AEROBIC AND ANAEROBIC Blood Culture adequate volume Performed at [facility] [address] [place], [place] [address]    Culture   Final    NO GROWTH 2 DAYS Performed at [facility]  [address] [place], [place] [address]    Report Status PENDING  Incomplete  Culture, blood (routine x 2)     Status: None (Preliminary result)   Collection Time: 01/06/20  8:15 AM   Specimen: BLOOD  Result Value Ref Range Status   Specimen Description   Final    BLOOD RIGHT ARM Performed at [facility] [address], [place], [place] [address]    Special Requests   Final    BOTTLES DRAWN AEROBIC AND ANAEROBIC Blood Culture adequate volume Performed at [facility] [address] [place], [place] [address]    Culture   Final    NO GROWTH 2 DAYS Performed at [facility] [name], [place], [place] [address]    Report Status PENDING  Incomplete    Radiology Reports DG Chest 2 View  Result Date: 01/05/2020 CLINICAL DATA:  Patient with stage IV lung cancer. Left lower quadrant abdominal pain. Diarrhea. EXAM: CHEST - 2 VIEW COMPARISON:  CT scan from January 05, 2020 at 11:39 a.m. FINDINGS: Free air seen under the diaphragm, also appreciated on the CT scan from earlier today. Healed right rib fractures. Mild infiltrate in the medial right lung base, more prominent since January 02, 2020. Pleural thickening versus small left pleural effusion. Atelectasis or scar in the left base. The heart, hila, mediastinum, lungs, and pleura are otherwise unchanged. IMPRESSION: 1. Free air under the diaphragm. This finding was seen on the CT scan from earlier today and called to Dr. Tana Coast by Dr. Jobe Igo at 12:30 p.m. 2. Mild opacity in the right base is likely atelectasis based on CT imaging from earlier today. 3. No other changes. Electronically Signed   By: Dorise Bullion III M.D   On: 01/05/2020 12:43   CT ABDOMEN PELVIS W CONTRAST  Result Date: 01/05/2020 CLINICAL DATA:  Nausea and vomiting. Acute diverticulitis. Worsening pain. Fevers. EXAM: CT ABDOMEN AND PELVIS WITH CONTRAST TECHNIQUE: Multidetector CT imaging of the abdomen and pelvis was performed using  the standard protocol following bolus administration of intravenous contrast. CONTRAST:  157mL OMNIPAQUE IOHEXOL 300 MG/ML  SOLN COMPARISON:  01/02/2020 FINDINGS: Lower chest: Bibasilar atelectasis. Normal heart size with small chronic pericardial effusion. Small bilateral pleural effusions, new. Multivessel coronary artery atherosclerosis. Hepatobiliary: Scattered well-circumscribed low-density liver lesions are likely cysts. Normal gallbladder, without biliary ductal dilatation. Pancreas: Normal, without mass or ductal dilatation. Spleen: Normal in size, without focal abnormality. Adrenals/Urinary Tract: Normal left adrenal gland. Mild right adrenal thickening. Right renal cortical thinning. No hydronephrosis. Decompressed urinary bladder around a Foley catheter. Stomach/Bowel: Normal stomach, without wall thickening. Scattered colonic diverticula. Increased wall thickening involving the proximal sigmoid, including on 67/2. Mid small bowel loops measure up to 2.4 cm. Pelvic small bowel loops are mildly thickened, including on 73/2. Vascular/Lymphatic: Advanced aortic and branch vessel atherosclerosis. No abdominopelvic adenopathy. Reproductive: Normal prostate. Other: New small volume cul-de-sac fluid, including on 71/2. Extensive, moderate volume free intraperitoneal air, including within the right upper quadrant on 27/2 and adjacent the right lobe of the liver on 29/2. Multiple small locules of pelvic free air, including on 61/2. Enhancing small volume fluid within the anterior left pelvis on 66/2 and up to 3.8 x 1.5 cm just inferior  to this on 71/2. Musculoskeletal: Bilateral remote rib fractures. Lumbosacral spondylosis. IMPRESSION: 1. Moderate free intraperitoneal air, likely due to complicated sigmoid diverticulitis. Small volume pericolonic fluid and enhancement, suspicious for infected ascites. 2. Borderline small bowel dilatation, suggesting adynamic ileus. Small bowel wall thickening within the pelvis is  likely due to secondary enteritis. 3. New small bilateral pleural effusions with similar pericardial effusion. 4. Coronary artery atherosclerosis. Aortic Atherosclerosis (ICD10-I70.0). Critical test results telephoned to Dr. Tana Coast. At the time of interpretation at 12:30 p.m.On 01/05/2020. Electronically Signed   By: Abigail Miyamoto M.D.   On: 01/05/2020 12:32   CT ABDOMEN PELVIS W CONTRAST  Result Date: 01/02/2020 CLINICAL DATA:  Lower abdominal pain. The patient is on immunotherapy for lung carcinoma. EXAM: CT ABDOMEN AND PELVIS WITH CONTRAST TECHNIQUE: Multidetector CT imaging of the abdomen and pelvis was performed using the standard protocol following bolus administration of intravenous contrast. CONTRAST:  100 mL OMNIPAQUE IOHEXOL 300 MG/ML  SOLN COMPARISON:  CT chest, abdomen and pelvis 05/13/2019. FINDINGS: Lower chest: No pleural effusion. Small pericardial effusion is unchanged. Lung bases are emphysematous. There is mild dependent atelectasis. Hepatobiliary: A few small low attenuating lesions in the liver likely representing cysts are unchanged. Status post cholecystectomy. Biliary tree unremarkable. Pancreas: Unremarkable. No pancreatic ductal dilatation or surrounding inflammatory changes. Spleen: Normal in size without focal abnormality. 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 The left adrenal gland appears normal. There is some scarring in the right kidney which is unchanged. The kidneys otherwise appear normal. No stones or hydronephrosis. The urinary bladder is distended but otherwise unremarkable. Stomach/Bowel: Diverticulosis is identified. There is thickening and stranding near the junction of the descending and sigmoid consistent with acute diverticulitis. No abscess or perforation. The stomach and small bowel appear normal. The appendix is normal in appearance. Note is made that the cecum is positioned in the right upper quadrant. Vascular/Lymphatic: Aortic  atherosclerosis. No enlarged abdominal or pelvic lymph nodes. Reproductive: Prostate is unremarkable. Other: None. Musculoskeletal: No acute or focal abnormality. Severe degenerative change throughout the visualized spine is noted. IMPRESSION: Diverticulosis with acute diverticulitis the junction of the distal descending and sigmoid colon. No abscess or perforation. No change in a moderate pericardial effusion. Emphysema. Extensive atherosclerosis. Electronically Signed   By: Inge Rise M.D.   On: 01/02/2020 13:10   DG CHEST PORT 1 VIEW  Result Date: 01/02/2020 CLINICAL DATA:  79 year old male with stage IV lung cancer, SI ADH. Undergoing chemotherapy. Positive COVID-19 yesterday. EXAM: PORTABLE CHEST 1 VIEW COMPARISON:  CT Abdomen and Pelvis earlier today. Chest CT 09/02/2019 and earlier. FINDINGS: Portable AP semi upright view at 1741 hours. Stable lung volumes and mediastinal contours since 2020. Numerous chronic right rib fractures. Chronic reticular opacity in the right upper lobe and near the left hilum. Elsewhere when allowing for portable technique the lungs are clear. No pneumothorax, pulmonary edema or pleural effusion. Stable visualized osseous structures. IMPRESSION: Stable radiographic appearance of chronic lung disease since July 2020. No acute cardiopulmonary abnormality. Electronically Signed   By: Genevie Ann M.D.   On: 01/02/2020 18:38   VAS [place] UPPER EXTREMITY ARTERIAL DUPLEX  Result Date: 01/06/2020 UPPER EXTREMITY DUPLEX STUDY Indications: Patient complains of cool left upper extremity with pallor. History:     Patient has a history ofrecent left upper extremity arterial line.  Comparison Study: No prior study. Performing Technologist: Maudry Mayhew MHA, RDMS, RVT, RDCS  Examination Guidelines: A complete evaluation includes B-mode imaging, spectral Doppler, color Doppler, and power Doppler  as needed of all accessible portions of each vessel. Bilateral testing is considered an  integral part of a complete examination. Limited examinations for reoccurring indications may be performed as noted.  Right Doppler Findings: +---------------+----------+--------+--------+--------+ Site           PSV (cm/s)WaveformStenosisComments +---------------+----------+--------+--------+--------+ Subclavian [id number]       biphasic                 +---------------+----------+--------+--------+--------+  Left Doppler Findings: +---------------+----------+--------------+--------+--------+ Site           PSV (cm/s)Waveform      StenosisComments +---------------+----------+--------------+--------+--------+ Subclavian Prox17        monophasic                     +---------------+----------+--------------+--------+--------+ Subclavian Mid 12        monophasic                     +---------------+----------+--------------+--------+--------+ Axillary       15.00     monophasic                     +---------------+----------+--------------+--------+--------+ Brachial Prox  14        monophasic                     +---------------+----------+--------------+--------+--------+ Brachial Dist  11        monophasic                     +---------------+----------+--------------+--------+--------+ Radial Prox    4         barely audible                 +---------------+----------+--------------+--------+--------+ Radial Dist              barely audible                 +---------------+----------+--------------+--------+--------+ Ulnar Dist     10        barely audible                 +---------------+----------+--------------+--------+--------+ Palmar Arch    6         barely audible                 +---------------+----------+--------------+--------+--------+   Summary:  Right: No obstruction visualized in the right subclavian artery. Left: Monophasic flow at the proximal left subclavian artery is       suggestive of proximal obstruction. Radial, ulnar, and  palmar       arch signals are patent, however signals are barely       detectable. *See table(s) above for measurements and observations. Electronically signed by Ruta Hinds MD on 01/06/2020 at 5:06:56 PM.    Final    ECHOCARDIOGRAM COMPLETE  Result Date: 01/02/2020   ECHOCARDIOGRAM REPORT   Patient Name:   Anthony Curry. Date of Exam: 01/02/2020 Medical Rec #:  [id number]           Height:       71.0 in Accession #:    [id number]          Weight:       145.0 lb Date of Birth:  [birth year]          BSA:          1.84 m Patient Age:    56 years            BP:  152/65 mmHg Patient Gender: M                   HR:           82 bpm. Exam Location:  Inpatient Procedure: 2D Echo, Cardiac Doppler and Color Doppler Indications:    Pericardial effusion 423.9  History:        Patient has prior history of Echocardiogram examinations, most                 recent 07/26/2014. Arrythmias:Atrial Fibrillation and Atrial                 Flutter; Risk Factors:Hypertension and Former Smoker. GERD.  Sonographer:    Paulita Fujita RDCS Referring Phys: [id number] AMRIT ADHIKARI  Sonographer Comments: Image acquisition challenging due to patient body habitus. IMPRESSIONS  1. Left ventricular ejection fraction, by visual estimation, is 60 to 65%. The left ventricle has normal function. There is no left ventricular hypertrophy.  2. Left ventricular diastolic parameters are consistent with Grade I diastolic dysfunction (impaired relaxation).  3. Global right ventricle has normal systolic function.The right ventricular size is normal. No increase in right ventricular wall thickness.  4. Left atrial size was normal.  5. Right atrial size was normal.  6. Large pericardial effusion.  7. The pericardial effusion is anterior to the right ventricle.  8. There is inversion of the right ventricular wall and excessive respiratory variation in the tricuspid valve spectral Doppler velocities.  9. The mitral valve is normal in structure.  Mild mitral valve regurgitation. No evidence of mitral stenosis. 10. The tricuspid valve is normal in structure. 11. The tricuspid valve is normal in structure. Tricuspid valve regurgitation is mild. 12. The aortic valve is normal in structure. Aortic valve regurgitation is not visualized. No evidence of aortic valve sclerosis or stenosis. 13. The pulmonic valve was normal in structure. Pulmonic valve regurgitation is not visualized. 14. Mildly elevated pulmonary artery systolic pressure. 15. The inferior vena cava is normal in size with greater than 50% respiratory variability, suggesting right atrial pressure of 3 mmHg. FINDINGS  Left Ventricle: Left ventricular ejection fraction, by visual estimation, is 60 to 65%. The left ventricle has normal function. The left ventricle is not well visualized. There is no left ventricular hypertrophy. Left ventricular diastolic parameters are consistent with Grade I diastolic dysfunction (impaired relaxation). Indeterminate filling pressures. Right Ventricle: The right ventricular size is normal. No increase in right ventricular wall thickness. Global RV systolic function is has normal systolic function. The tricuspid regurgitant velocity is 2.46 m/s, and with an assumed right atrial pressure  of 8 mmHg, the estimated right ventricular systolic pressure is mildly elevated at 32.2 mmHg. Left Atrium: Left atrial size was normal in size. Right Atrium: Right atrial size was normal in size Pericardium: A large pericardial effusion is present. The pericardial effusion is anterior to the right ventricle. There is inversion of the right ventricular wall and excessive respiratory variation in the tricuspid valve spectral Doppler velocities. Pericardial effusion measuring up to 1.86 cm. Unable to visualize the IVC. Findings may be con. Mitral Valve: The mitral valve is normal in structure. Mild mitral valve regurgitation. No evidence of mitral valve stenosis by observation. Tricuspid  Valve: The tricuspid valve is normal in structure. Tricuspid valve regurgitation is mild. Aortic Valve: The aortic valve is normal in structure. Aortic valve regurgitation is not visualized. The aortic valve is structurally normal, with no evidence of sclerosis or stenosis. Pulmonic Valve: The pulmonic  valve was normal in structure. Pulmonic valve regurgitation is not visualized. Pulmonic regurgitation is not visualized. Aorta: The aortic root, ascending aorta and aortic arch are all structurally normal, with no evidence of dilitation or obstruction. Venous: The inferior vena cava was not well visualized. The inferior vena cava is normal in size with greater than 50% respiratory variability, suggesting right atrial pressure of 3 mmHg. IAS/Shunts: No atrial level shunt detected by color flow Doppler. There is no evidence of a patent foramen ovale. No ventricular septal defect is seen or detected. There is no evidence of an atrial septal defect.   Diastology LV e' lateral:   5.87 cm/s LV E/e' lateral: 11.5 LV e' medial:    4.90 cm/s LV E/e' medial:  13.7  RIGHT VENTRICLE RV S prime:     28.60 cm/s TAPSE (M-mode): 2.1 cm LEFT ATRIUM             Index       RIGHT ATRIUM           Index LA Vol (A2C):   30.0 ml 16.31 ml/m RA Area:     12.10 cm LA Vol (A4C):   21.2 ml 11.53 ml/m RA Volume:   30.10 ml  16.37 ml/m LA Biplane Vol: 25.3 ml 13.76 ml/m  AORTIC VALVE LVOT Vmax:   70.30 cm/s LVOT Vmean:  50.200 cm/s LVOT VTI:    0.137 m MITRAL VALVE                        TRICUSPID VALVE MV Area (PHT): 2.96 cm             TR Peak grad:   24.2 mmHg MV PHT:        74.24 msec           TR Vmax:        246.00 cm/s MV Decel Time: 256 msec MV E velocity: 67.30 cm/s 103 cm/s  SHUNTS MV A velocity: 65.10 cm/s 70.3 cm/s Systemic VTI: 0.14 m MV E/A ratio:  1.03       1.5  Skeet Latch MD Electronically signed by Skeet Latch MD Signature Date/Time: 01/02/2020/6:33:38 PM    Final    HYBRID OR IMAGING ([place])  Result Date:  01/06/2020 There is no interpretation for this exam.  This order is for images obtained during a surgical procedure.  Please See "Surgeries" Tab for more information regarding the procedure.

## 2020-01-08 NOTE — Anesthesia Preprocedure Evaluation (Addendum)
Anesthesia Evaluation  Patient identified by MRN, date of birth, ID band Patient awake    Reviewed: Allergy & Precautions, NPO status , Patient's Chart, lab work & pertinent test results  Airway Mallampati: II  TM Distance: >3 FB     Dental   Pulmonary pneumonia, former smoker,    breath sounds clear to auscultation       Cardiovascular hypertension,  Rhythm:Regular Rate:Normal     Neuro/Psych PSYCHIATRIC DISORDERS Anxiety  Neuromuscular disease    GI/Hepatic GERD  ,  Endo/Other  negative endocrine ROS  Renal/GU negative Renal ROS     Musculoskeletal  (+) Arthritis ,   Abdominal   Peds  Hematology  (+) anemia ,   Anesthesia Other Findings   Reproductive/Obstetrics                             Anesthesia Physical Anesthesia Plan  ASA: III  Anesthesia Plan: General   Post-op Pain Management:    Induction: Intravenous  PONV Risk Score and Plan: 2 and Ondansetron, Dexamethasone and Midazolam  Airway Management Planned: Oral ETT  Additional Equipment:   Intra-op Plan:   Post-operative Plan: Extubation in OR  Informed Consent: I have reviewed the patients History and Physical, chart, labs and discussed the procedure including the risks, benefits and alternatives for the proposed anesthesia with the patient or authorized representative who has indicated his/her understanding and acceptance.     Dental advisory given  Plan Discussed with: CRNA and Anesthesiologist  Anesthesia Plan Comments:         Anesthesia Quick Evaluation

## 2020-01-08 NOTE — Progress Notes (Signed)
Nutrition Follow-up  DOCUMENTATION CODES:   Not applicable  INTERVENTION:  Continue 30 ml Prostat po BID, each supplement provides 100 kcal and 15 grams of protein.   Provide Ensure Enlive po BID, each supplement provides 350 kcal and 20 grams of protein  Encourage adequate PO intake.   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 male with medical history of stage IV R-sided NSCLC, SIADH, A. fib, hypothyroidism, depression, and anxiety. He presented to the ED due to sudden onset of abdomen pain since 1/27. Pain was located mainly in the LLQ. He did not report of any nausea or vomiting. Patient's wife recently tested positive for COVID-19. CT abdomen showed acute diverticulitis at the junction of distal descending and sigmoid colon. No abscess or perforation. On 1/31, patient complaining of worsening pain, fevers. WBC count worsening. CT abdomen was repeated which showed perforation with moderate free intraperitoneal air with adynamic ileus  1/31 - s/p ex lap, sigmoid resection with end colostomy creation, JP drain 2/1 - Left subclavian axillary brachial radial ulnar embolectomy due to acute ischemia left hand   Diet has been advanced to a full liquid diet. Pt has been tolerating clear liquids well. Pt reports hunger. Pt currently has Prostat ordered and has been consuming them. Pt has been refusing his Boost Breeze supplements. RD to order Ensure shakes instead. Nutritional supplements to aid in caloric and protein needs.   Labs and medications reviewed. JP drain output: 75 ml.   Diet Order:   Diet Order           Diet full liquid Room service appropriate? Yes; Fluid consistency: Thin  Diet effective now              EDUCATION NEEDS:   No education needs have been identified at this time  Skin:  Skin Assessment: Skin Integrity Issues: Skin Integrity Issues:: Incisions Incisions: abdomen, L arm  Last BM:  2/3 colostomy  Height:   Ht Readings from Last 1 Encounters:  01/02/20 5\' 11"  (1.803 m)    Weight:   Wt Readings from Last 1 Encounters:  01/02/20 65.8 kg    Ideal Body Weight:  78.2 kg  BMI:  Body mass index is 20.22 kg/m.  Estimated Nutritional Needs:   Kcal:  1840-2105 kcal  Protein:  92-105 grams  Fluid:  >/= 2 L/day    Corrin Parker, MS, RD, LDN Pager # [phone] After hours/ weekend pager # [phone]

## 2020-01-08 NOTE — Progress Notes (Signed)
Physical Therapy Treatment Patient Details Name: Anthony Curry. MRN: [id number] DOB: 1941/11/13 Today's Date: 01/08/2020    History of Present Illness Pt is an 79 y.o. male admitted 01/02/20 with LLQ abdominal pain and diarrhea; also with (+) COVID-19 test. 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 Underwent L brachial, radial, ulnar embolectomies 2/1. PMH includes non-small cell lung CA (currently on chemo).    PT Comments    On entry pt request pain medication for 7/10 pain in his abdomen and 5/10 pain in his back. RN provided IV medication and patient willing to work with therapy. Pt is slightly lethargic today and requires modA for bed mobility and minAx2 for transfers and ambulation from bed to door and back. During ambulation pt started bleeding from around IV site. Pt sat on EoB and RN notified of bleeding. Pt able to tolerate 5 minutes of static sitting without assist and then 8 minutes of static sitting with posterior support while RN fixed IV and dressing. Pt required total A to return to bed once dressing changed. PT recommending SNF level rehab at discharge to improve strength and endurance prior to returning home. PT will continue to follow acutely.    Follow Up Recommendations  SNF     Equipment Recommendations  None recommended by PT    Recommendations for Other Services       Precautions / Restrictions Precautions Precautions: Fall Restrictions Weight Bearing Restrictions: No    Mobility  Bed Mobility Overal bed mobility: Needs Assistance Bed Mobility: Rolling;Sidelying to Sit;Sit to Sidelying Rolling: Mod assist Sidelying to sit: Mod assist;+2 for physical assistance     Sit to sidelying: Max assist;+2 for physical assistance General bed  mobility comments: modA for coming into sidelying, modAx2 for management of LE off bed and trunk to upright, maxAx2 for trunk to center of bed and LE into bed   Transfers Overall transfer level: Needs assistance Equipment used: 4-wheeled walker Transfers: Sit to/from Stand Sit to Stand: From elevated surface;Min assist;+2 safety/equipment         General transfer comment: minAx2 for power up to standing, slight dizziness that dissipated quickly  Ambulation/Gait Ambulation/Gait assistance: Min assist;+2 safety/equipment Gait Distance (Feet): 20 Feet Assistive device: 4-wheeled walker Gait Pattern/deviations: Decreased stride length;Step-through pattern Gait velocity: slowed Gait velocity interpretation: <1.31 ft/sec, indicative of household ambulator General Gait Details: min Ax2 for steadying with ambulation to door and back. pt with increased bleeding around IV site          Balance Overall balance assessment: Needs assistance Sitting-balance support: No upper extremity supported;Feet supported Sitting balance-Leahy Scale: Fair     Standing balance support: No upper extremity supported Standing balance-Leahy Scale: Poor Standing balance comment: posterior lean when not holding onto rollator                            Cognition Arousal/Alertness: Awake/alert Behavior During Therapy: WFL for tasks assessed/performed Overall Cognitive Status: Within Functional Limits for tasks assessed                                           General Comments  VSS on RA  Pertinent Vitals/Pain Pain Assessment: 0-10 Pain Score: 7  Pain Location: abdomen; 5/10 pain in low back Pain Descriptors / Indicators: Sharp(especially when he coughs) Pain Intervention(s): Limited activity within patient's tolerance;Monitored during session;Repositioned           PT Goals (current goals can now be found in the care plan section) Acute Rehab PT Goals Patient  Stated Goal: decrease abd pain; find out why his left lower back hurts PT Goal Formulation: With patient Time For Goal Achievement: 01/21/20 Potential to Achieve Goals: Good Progress towards PT goals: Progressing toward goals    Frequency    Min 3X/week      PT Plan Discharge plan needs to be updated       AM-PAC PT "6 Clicks" Mobility   Outcome Measure  Help needed turning from your back to your side while in a flat bed without using bedrails?: A Little Help needed moving from lying on your back to sitting on the side of a flat bed without using bedrails?: A Little Help needed moving to and from a bed to a chair (including a wheelchair)?: A Little Help needed standing up from a chair using your arms (e.g., wheelchair or bedside chair)?: A Little Help needed to walk in hospital room?: A Little Help needed climbing 3-5 steps with a railing? : A Lot 6 Click Score: 17    End of Session   Activity Tolerance: Patient limited by pain Patient left: in bed;with call bell/phone within reach;with nursing/sitter in room Nurse Communication: Mobility status PT Visit Diagnosis: Unsteadiness on feet (R26.81);Other symptoms and signs involving the nervous system (Y80.165)     Time: 5374-8270 PT Time Calculation (min) (ACUTE ONLY): 32 min  Charges:  $Gait Training: 8-22 mins $Therapeutic Activity: 8-22 mins                     Kaileah Shevchenko B. Migdalia Dk PT, DPT Acute Rehabilitation Services Pager [phone] Office [phone]    [place] 01/08/2020, 5:29 PM

## 2020-01-08 NOTE — Progress Notes (Signed)
[place] for Heparin Indication: atrial fibrillation, s/p L subclavian axillary brachial radial ulnar embolectomy  No Known Allergies  Patient Measurements: Height: 5\' 11"  (180.3 cm) Weight: 145 lb (65.8 kg) IBW/kg (Calculated) : 75.3 Heparin Dosing Weight: 65.8 kg  Vital Signs: Temp: 97.7 F (36.5 C) (02/03 1300) Temp Source: Oral (02/03 1300) BP: 157/66 (02/03 1300) Pulse Rate: 67 (02/03 1300)  Labs: Recent Labs    01/06/20 0540 01/06/20 0540 01/07/20 0421 01/08/20 0505  HGB 12.1*   < > 11.2* 11.5*  HCT 35.6*  --  32.4* 32.2*  PLT 220  --  273 277  APTT  --   --  78* 84*  HEPARINUNFRC  --   --  1.03* 0.46  CREATININE 0.73  --  0.79 0.70   < > = values in this interval not displayed.    Estimated Creatinine Clearance: 68.5 mL/min (by C-G formula based on SCr of 0.7 mg/dL).   Medical History: Past Medical History:  Diagnosis Date  . 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 Adrenal mass ([place])   . Alcoholism in remission ([place])   . Anemia of chronic disease   . 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 Anxiety   . BPH (benign prostatic hyperplasia)   . Cancer ([place])    small cell/lung  . Complication of anesthesia 09/08/2015    JITTERY AFTER GALLBLADDER SURGERY  . GERD (gastroesophageal reflux disease)   . History of radiation therapy 11/14/16, 11/18/16, 11/22/16   Left upper lung: 54 Gy in 3 fractions  . Hypertension   . Neoplastic malignant related fatigue    IV tx. every 2 weeks last9-14-16 (Dr. Earlie Server), radiation last tx. 6 months  . 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 Pericardial effusion   . Persistent atrial fibrillation ([place])   . Pulmonary nodules   . Radiation 12/02/14-12/16/14   Left adrenal gland 30 Gy in 10 fractions    Medications:  Scheduled:  . vitamin C  500 mg Oral BID  . chlorhexidine  15 mL Mouth Rinse BID  . Chlorhexidine Gluconate Cloth  6 each Topical Daily  . demeclocycline  150  mg Oral BID  . diltiazem  180 mg Oral Q1200  . feeding supplement (ENSURE ENLIVE)  237 mL Oral BID BM  . feeding supplement (PRO-STAT SUGAR FREE 64)  30 mL Oral BID  . fluticasone  2 spray Each Nare QHS  . Ipratropium-Albuterol  1 puff Inhalation Q6H  . [START ON 01/09/2020] levothyroxine  50 mcg Oral QAC breakfast  . mouth rinse  15 mL Mouth Rinse q12n4p  . mirtazapine  30 mg Oral QHS  . mometasone-formoterol  2 puff Inhalation BID  . tamsulosin  0.4 mg Oral BID  . thiamine  100 mg Oral Daily  . zinc sulfate  220 mg Oral Daily   Infusions:  . sodium chloride 250 mL (01/06/20 2225)  . heparin 900 Units/hr (01/07/20 2259)  . piperacillin-tazobactam (ZOSYN)  IV 3.375 g (01/08/20 1311)  . remdesivir 100 mg in NS 100 mL 100 mg (01/08/20 0939)  . sodium chloride Stopped (01/05/20 2120)    Assessment: 79 yo M presented to [facility] with sudden onset of abd pain starting 1/27.  Pt on apixaban PTA for hx afib.  Heparin level therapeutic this AM  Goal of Therapy:  Heparin level 0.3-0.7 units/ml  aPTT goal 66-102  Monitor platelets by anticoagulation protocol: Yes   Plan:  Cont heparin at 900 units/hr Daily heparin level,  CBC while on heparin.  Thank you Anette Guarneri, PharmD

## 2020-01-08 NOTE — TOC Initial Note (Signed)
Transition of Care (TOC) - Initial/Assessment Note    Patient Details  Name: Anthony Curry. MRN: [id number] Date of Birth: 03-01-1941  Transition of Care [facility]) CM/SW Contact:    Arvella Merles, LCSW Phone Number: 01/08/2020, 9:30 AM  Clinical Narrative:                 CSW received consult for possible SNF placement at time of discharge. CSW spoke with patient regarding PT recommendation of SNF placement at time of discharge. Patient expressed understanding of PT recommendation and is agreeable to SNF placement at time of discharge. Patient reports preference for a SNF in [place]. Patient provided permission for CSW to contact his daughter [name]. CSW spoke with patient's daughter who agrees with SNF recommendation. Patient's daughter has concerns about patient going to a SNF with Covid+ patients and would like patient to be retested when he is medically cleared, prior to d/c. CSW discussed insurance authorization process and provided information on where to find Medicare SNF ratings list. CSW to continue to follow and assist with discharge planning needs.   Expected Discharge Plan: Skilled Nursing Facility Barriers to Discharge: Continued Medical Work up   Patient Goals and CMS Choice Patient states their goals for this hospitalization and ongoing recovery are:: to get stronger CMS Medicare.gov Compare Post Acute Care list provided to:: Patient Represenative (must comment)(daughter) Choice offered to / list presented to : Patient  Expected Discharge Plan and Services Expected Discharge Plan: [place]                                              Prior Living Arrangements/Services   Lives with:: Spouse Patient language and need for interpreter reviewed:: Yes        Need for Family Participation in Patient Care: Yes (Comment) Care giver support system in place?: Yes (comment)   Criminal Activity/Legal Involvement Pertinent to Current  Situation/Hospitalization: No - Comment as needed  Activities of Daily Living Home Assistive Devices/Equipment: Walker (specify type), Eyeglasses, Built-in shower seat, Grab bars in shower ADL Screening (condition at time of admission) Patient's cognitive ability adequate to safely complete daily activities?: Yes Is the patient deaf or have difficulty hearing?: No Does the patient have difficulty seeing, even when wearing glasses/contacts?: No Does the patient have difficulty concentrating, remembering, or making decisions?: No Patient able to express need for assistance with ADLs?: Yes Does the patient have difficulty dressing or bathing?: Yes Independently performs ADLs?: Yes (appropriate for developmental age) Does the patient have difficulty walking or climbing stairs?: Yes Weakness of Legs: Both Weakness of Arms/Hands: None  Permission Sought/Granted Permission sought to share information with : Facility Sport and exercise psychologist, Family Supports Permission granted to share information with : Yes, Verbal Permission Granted  Share Information with NAME: [name]  Permission granted to share info w AGENCY: SNF  Permission granted to share info w Relationship: Daughter  Permission granted to share info w Contact Information: [phone]  Emotional Assessment   Attitude/Demeanor/Rapport: Unable to Assess Affect (typically observed): Unable to Assess Orientation: : Oriented to Self, Oriented to Place, Oriented to  Time, Oriented to Situation Alcohol / Substance Use: Not Applicable Psych Involvement: No (comment)  Admission diagnosis:  Hyponatremia [E87.1] Acute diverticulitis [K57.92] Patient Active Problem List   Diagnosis Date Noted  . Diverticulitis 01/02/2020  . Acute diverticulitis 01/02/2020  . Ataxia 06/30/2019  .  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 Generalized weakness 06/28/2019  . Lactic acidosis 06/28/2019  . Abnormal urinalysis  06/28/2019  . Hypertension 12/07/2016  . 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 Acute urinary retention 09/09/2015  . Choledocholithiasis with cholecystitis 09/08/2015  . Common bile duct stone   . Encounter for antineoplastic immunotherapy 06/28/2015  . Atrial fibrillation ([place]) 06/11/2015  . Gynecomastia, male 06/10/2015  . Dizziness 05/11/2015  . Malignant neoplasm of lower lobe of right lung ([place]) 09/17/2014  . Neutropenic fever ([place]) 09/02/2014  . Neoplastic malignant related fatigue 08/13/2014  . Physical deconditioning 08/13/2014  . Hypoalbuminemia 08/13/2014  . Diarrhea 08/13/2014  . Antineoplastic chemotherapy induced anemia(285.3) 08/13/2014  . Atrial fibrillation with RVR ([place]) 07/28/2014  . HCAP (healthcare-associated pneumonia) 07/25/2014  . Alcoholism in remission ([place]) 07/25/2014  . Atrial flutter by electrocardiogram ([place]) 07/25/2014  . Swelling of limb 06/10/2014  . Lung cancer ([place]) 10/15/2013  . Hypotension 09/10/2013  . Adrenal mass ([place]) 09/09/2013  . 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 Pulmonary nodules 08/19/2013  . Anemia of chronic disease 07/03/2013  . BPH (benign prostatic hyperplasia) 07/03/2013  . GERD (gastroesophageal reflux disease) 07/03/2013  . Fall 07/03/2013  . Alcohol intoxication ([place]) 07/03/2013  . 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 Alcohol abuse 10/15/2011  . Hyponatremia 10/15/2011   PCP:  [name], MD Pharmacy:   [facility] [place], [place] [address] [place] [address] Phone: [phone] Fax: [phone]     Social Determinants of Health (SDOH) Interventions    Readmission Risk Interventions Readmission Risk Prevention Plan 01/06/2020  Transportation Screening Complete  Medication Review (RN Care Manager) Complete  Some recent data might be hidden

## 2020-01-09 LAB — FERRITIN: Ferritin: 358 ng/mL — ABNORMAL HIGH (ref 24–336)

## 2020-01-09 LAB — C-REACTIVE PROTEIN: CRP: 9.1 mg/dL — ABNORMAL HIGH (ref <1.0)

## 2020-01-09 LAB — CBC
HCT: 34.9 % — ABNORMAL LOW (ref 39.0–52.0)
Hemoglobin: 12 g/dL — ABNORMAL LOW (ref 13.0–17.0)
MCH: 30.5 pg (ref 26.0–34.0)
MCHC: 34.4 g/dL (ref 30.0–36.0)
MCV: 88.6 fL (ref 80.0–100.0)
Platelets: 317 10*3/uL (ref 150–400)
RBC: 3.94 MIL/uL — ABNORMAL LOW (ref 4.22–5.81)
RDW: 14.1 % (ref 11.5–15.5)
WBC: 10.3 10*3/uL (ref 4.0–10.5)
nRBC: 0 % (ref 0.0–0.2)

## 2020-01-09 LAB — D-DIMER, QUANTITATIVE: D-Dimer, Quant: 2.35 ug/mL-FEU — ABNORMAL HIGH (ref 0.00–0.50)

## 2020-01-09 LAB — HEPARIN LEVEL (UNFRACTIONATED): Heparin Unfractionated: 0.22 IU/mL — ABNORMAL LOW (ref 0.30–0.70)

## 2020-01-09 MED ORDER — GABAPENTIN 100 MG PO CAPS
200.0000 mg | ORAL_CAPSULE | Freq: Two times a day (BID) | ORAL | Status: DC
  Administered 2020-01-09 – 2020-01-10 (×3): 200 mg via ORAL
  Filled 2020-01-09 (×3): qty 2

## 2020-01-09 MED ORDER — TAMSULOSIN HCL 0.4 MG PO CAPS: 0.4000 mg | ORAL_CAPSULE | Freq: Every day | ORAL | Status: DC

## 2020-01-09 MED ORDER — POLYETHYLENE GLYCOL 3350 17 G PO PACK
17.0000 g | PACK | Freq: Two times a day (BID) | ORAL | Status: AC
  Administered 2020-01-09 (×2): 17 g via ORAL
  Filled 2020-01-09 (×2): qty 1

## 2020-01-09 MED ORDER — ACETAMINOPHEN 500 MG PO TABS
1000.0000 mg | ORAL_TABLET | Freq: Four times a day (QID) | ORAL | Status: DC
  Administered 2020-01-09 – 2020-01-10 (×6): 1000 mg via ORAL
  Filled 2020-01-09 (×6): qty 2

## 2020-01-09 MED ORDER — APIXABAN 5 MG PO TABS
5.0000 mg | ORAL_TABLET | Freq: Two times a day (BID) | ORAL | Status: DC
  Administered 2020-01-09 – 2020-01-10 (×3): 5 mg via ORAL
  Filled 2020-01-09 (×3): qty 1

## 2020-01-09 MED ORDER — METHOCARBAMOL 750 MG PO TABS
750.0000 mg | ORAL_TABLET | Freq: Three times a day (TID) | ORAL | Status: DC
  Administered 2020-01-09 – 2020-01-10 (×5): 750 mg via ORAL
  Filled 2020-01-09 (×5): qty 1

## 2020-01-09 NOTE — Progress Notes (Signed)
Physical Therapy Treatment Patient Details Name: Anthony Curry. MRN: [id number] DOB: [birth year] Today's Date: 01/09/2020    History of Present Illness Pt is an 79 y.o. male admitted 01/02/20 with LLQ abdominal pain and diarrhea; also with (+) COVID-19 test. 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 Underwent L brachial, radial, ulnar embolectomies 2/1. PMH includes non-small cell lung CA (currently on chemo).    PT Comments    Patient tolerating more mobility this date, although remains limited by abd pain and respiratory status. Able to walk in room and perform seated ADLs. Patient requires re-training in safe use of rollator--especially during transfers on/off rollator.     Follow Up Recommendations  SNF     Equipment Recommendations  None recommended by PT    Recommendations for Other Services       Precautions / Restrictions Precautions Precautions: Fall Restrictions Weight Bearing Restrictions: No    Mobility  Bed Mobility Overal bed mobility: Needs Assistance Bed Mobility: Rolling;Sidelying to Sit;Sit to Sidelying Rolling: Min guard Sidelying to sit: Min guard       General bed mobility comments: vc for technique; +rail  Transfers Overall transfer level: Needs assistance Equipment used: 4-wheeled walker Transfers: Sit to/from Stand Sit to Stand: Min assist         General transfer comment: vc for locking/unlocking rollator; assist to stabilize rollator and to power up and steady  Ambulation/Gait Ambulation/Gait assistance: Min assist;+2 safety/equipment Gait Distance (Feet): 5 Feet(seated rest and ADLs; 18 ft) Assistive device: 4-wheeled walker Gait Pattern/deviations: Decreased stride length;Step-through pattern Gait velocity:  slowed   General Gait Details: min A for steadying with ambulation; +2 for lines and O2   Stairs             Wheelchair Mobility    Modified Rankin (Stroke Patients Only)       Balance Overall balance assessment: Needs assistance Sitting-balance support: No upper extremity supported;Feet supported Sitting balance-Leahy Scale: Fair Sitting balance - Comments: incr pain with seated brushing teeth as he would forward bend to spit into sink   Standing balance support: Bilateral upper extremity supported Standing balance-Leahy Scale: Poor Standing balance comment: posterior lean when not holding onto rollator                            Cognition Arousal/Alertness: Awake/alert Behavior During Therapy: WFL for tasks assessed/performed Overall Cognitive Status: Within Functional Limits for tasks assessed                                        Exercises      General Comments General comments (skin integrity, edema, etc.): On 2L with sats 90-94% during session (periods where pleth not registering when moving his hand)      Pertinent Vitals/Pain Pain Assessment: Faces Faces Pain Scale: Hurts even more Pain Location: abdomen; low back Pain Descriptors / Indicators: Sharp(especially when he coughs) Pain Intervention(s): Limited activity within patient's tolerance;Monitored during session;Repositioned    Home Living                      Prior Function  PT Goals (current goals can now be found in the care plan section) Acute Rehab PT Goals Patient Stated Goal: decrease abd pain; find out why his left lower back hurts Time For Goal Achievement: 01/21/20 Potential to Achieve Goals: Good Progress towards PT goals: Progressing toward goals    Frequency    Min 2X/week(pt/family decided to go to SNF prior to home)      PT Plan Discharge plan needs to be updated    Co-evaluation PT/OT/SLP Co-Evaluation/Treatment:  Yes Reason for Co-Treatment: Complexity of the patient's impairments (multi-system involvement);For patient/therapist safety;To address functional/ADL transfers          AM-PAC PT "6 Clicks" Mobility   Outcome Measure  Help needed turning from your back to your side while in a flat bed without using bedrails?: A Little Help needed moving from lying on your back to sitting on the side of a flat bed without using bedrails?: A Little Help needed moving to and from a bed to a chair (including a wheelchair)?: A Little Help needed standing up from a chair using your arms (e.g., wheelchair or bedside chair)?: A Little Help needed to walk in hospital room?: A Little Help needed climbing 3-5 steps with a railing? : A Lot 6 Click Score: 17    End of Session Equipment Utilized During Treatment: Oxygen Activity Tolerance: Patient limited by fatigue Patient left: with call bell/phone within reach;in chair   PT Visit Diagnosis: Unsteadiness on feet (R26.81);Other symptoms and signs involving the nervous system (R29.898)     Time: 7290-2111 PT Time Calculation (min) (ACUTE ONLY): 41 min  Charges:  $Therapeutic Activity: 8-22 mins                      Arby Barrette, PT Pager [phone]    Rexanne Mano 01/09/2020, 2:53 PM

## 2020-01-09 NOTE — NC FL2 (Signed)
[place] LEVEL OF CARE SCREENING TOOL     IDENTIFICATION  Patient Name: Anthony Curry. Birthdate: [birth year] Sex: male Admission Date (Current Location): 01/02/2020  [facility] and [place] Number:  Herbalist and Address:  [facility], [place] [address] [place], [place] [address]      Provider Number: [id number]  Attending Physician Name and Address:  Jonetta Osgood, MD  Relative Name and Phone Number:  [name] daughter [phone]    Current Level of Care: Hospital Recommended Level of Care: [place] Prior Approval Number:    Date Approved/Denied:   PASRR Number: [id number]            02/07/2020  Discharge Plan: SNF    Current Diagnoses: Patient Active Problem List   Diagnosis Date Noted  . Diverticulitis 01/02/2020  . Acute diverticulitis 01/02/2020  . Ataxia 06/30/2019  . 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 Generalized weakness 06/28/2019  . Lactic acidosis 06/28/2019  . Abnormal urinalysis 06/28/2019  . Hypertension 12/07/2016  . Chemotherapy-induced neuropathy ([place]) 10/15/2015  . Acute urinary retention 09/09/2015  . Choledocholithiasis with cholecystitis 09/08/2015  . Common bile duct stone   . Encounter for antineoplastic immunotherapy 06/28/2015  . Atrial fibrillation ([place]) 06/11/2015  . Gynecomastia, male 06/10/2015  . Dizziness 05/11/2015  . Malignant neoplasm of lower lobe of right lung ([place]) 09/17/2014  . Neutropenic fever ([place]) 09/02/2014  . Neoplastic malignant related fatigue 08/13/2014  . Physical deconditioning 08/13/2014  . Hypoalbuminemia 08/13/2014  . Diarrhea 08/13/2014  . Antineoplastic chemotherapy induced anemia(285.3) 08/13/2014  . Atrial fibrillation with RVR ([place]) 07/28/2014  . HCAP (healthcare-associated pneumonia) 07/25/2014  . Alcoholism in remission ([place]) 07/25/2014  . Atrial flutter by electrocardiogram ([place]) 07/25/2014  .  Swelling of limb 06/10/2014  . Lung cancer ([place]) 10/15/2013  . Hypotension 09/10/2013  . Adrenal mass ([place]) 09/09/2013  . 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 Pulmonary nodules 08/19/2013  . Anemia of chronic disease 07/03/2013  . BPH (benign prostatic hyperplasia) 07/03/2013  . GERD (gastroesophageal reflux disease) 07/03/2013  . Fall 07/03/2013  . Alcohol intoxication ([place]) 07/03/2013  . 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 Alcohol abuse 10/15/2011  . Hyponatremia 10/15/2011    Orientation RESPIRATION BLADDER Height & Weight     Self, Time, Situation, Place  O2(Nasal Cannula) Continent, External catheter Weight: 145 lb (65.8 kg) Height:  5\' 11"  (180.3 cm)  BEHAVIORAL SYMPTOMS/MOOD NEUROLOGICAL BOWEL NUTRITION STATUS      Continent Diet(see d/c plan)  AMBULATORY STATUS COMMUNICATION OF NEEDS Skin   Extensive Assist Verbally Normal                       Personal Care Assistance Level of Assistance  Bathing, Dressing, Feeding Bathing Assistance: Maximum assistance Feeding assistance: Limited assistance Dressing Assistance: Limited assistance     Functional Limitations Info             SPECIAL CARE FACTORS FREQUENCY  PT (By licensed PT), OT (By licensed OT)     PT Frequency: 5x a week OT Frequency: 5x a week            Contractures Contractures Info: Not present    Additional Factors Info  Code Status, Allergies, Isolation Precautions Code Status Info: Full Allergies Info: KNA     Isolation Precautions Info: Covid+     Current Medications (01/09/2020):  This is the current hospital active medication list Current Facility-Administered  Medications  Medication Dose Route Frequency Provider Last Rate Last Admin  . 0.9 %  sodium chloride infusion   Intravenous PRN Rai, Ripudeep K, MD 10 mL/hr at 01/06/20 2225 250 mL at 01/06/20 2225  . acetaminophen (TYLENOL) tablet 650 mg  650 mg Oral Q6H PRN Rai, Ripudeep K, MD   650 mg at 01/05/20 0405   . albuterol (VENTOLIN HFA) 108 (90 Base) MCG/ACT inhaler 2 puff  2 puff Inhalation Q4H PRN Rai, Ripudeep K, MD      . ALPRAZolam Duanne Moron) tablet 0.5 mg  0.5 mg Oral QHS PRN Rai, Ripudeep K, MD   0.5 mg at 01/08/20 0045  . ascorbic acid (VITAMIN C) tablet 500 mg  500 mg Oral BID Rai, Ripudeep K, MD   500 mg at 01/08/20 2225  . chlorhexidine (PERIDEX) 0.12 % solution 15 mL  15 mL Mouth Rinse BID Rai, Ripudeep K, MD   15 mL at 01/08/20 2225  . Chlorhexidine Gluconate Cloth 2 % PADS 6 each  6 each Topical Daily Rai, Ripudeep K, MD   6 each at 01/08/20 1746  . demeclocycline (DECLOMYCIN) tablet 150 mg  150 mg Oral BID Rai, Ripudeep K, MD   150 mg at 01/08/20 2225  . diltiazem (CARDIZEM CD) 24 hr capsule 180 mg  180 mg Oral Q1200 Rai, Ripudeep K, MD   180 mg at 01/08/20 1308  . feeding supplement (ENSURE ENLIVE) (ENSURE ENLIVE) liquid 237 mL  237 mL Oral BID BM Jonetta Osgood, MD   237 mL at 01/08/20 1602  . feeding supplement (PRO-STAT SUGAR FREE 64) liquid 30 mL  30 mL Oral BID Hosie Poisson, MD   30 mL at 01/08/20 2225  . fluticasone (FLONASE) 50 MCG/ACT nasal spray 2 spray  2 spray Each Nare QHS Rai, Ripudeep K, MD   2 spray at 01/08/20 2228  . heparin ADULT infusion 100 units/mL (25000 units/282mL sodium chloride 0.45%)  900 Units/hr Intravenous Continuous Erenest Blank, RPH 9 mL/hr at 01/09/20 2633 900 Units/hr at 01/09/20 3545  . hydrALAZINE (APRESOLINE) injection 10 mg  10 mg Intravenous Q6H PRN Rai, Ripudeep K, MD      . HYDROmorphone (DILAUDID) injection 1 mg  1 mg Intravenous Q3H PRN Hosie Poisson, MD   1 mg at 01/09/20 0634  . Ipratropium-Albuterol (COMBIVENT) respimat 1 puff  1 puff Inhalation Q6H Rai, Ripudeep K, MD   1 puff at 01/09/20 0222  . levothyroxine (SYNTHROID) tablet 50 mcg  50 mcg Oral QAC breakfast Jonetta Osgood, MD   50 mcg at 01/09/20 [phone]  . MEDLINE mouth rinse  15 mL Mouth Rinse q12n4p Rai, Ripudeep K, MD   15 mL at 01/08/20 1746  . mirtazapine (REMERON) tablet 30  mg  30 mg Oral QHS Rai, Ripudeep K, MD   30 mg at 01/08/20 2225  . mometasone-formoterol (DULERA) 100-5 MCG/ACT inhaler 2 puff  2 puff Inhalation BID Rai, Ripudeep K, MD   2 puff at 01/08/20 1955  . ondansetron (ZOFRAN) injection 4 mg  4 mg Intravenous Q6H PRN Rai, Ripudeep K, MD      . oxyCODONE (Oxy IR/ROXICODONE) immediate release tablet 5 mg  5 mg Oral Q6H PRN Rai, Ripudeep K, MD   5 mg at 01/08/20 2225  . piperacillin-tazobactam (ZOSYN) IVPB 3.375 g  3.375 g Intravenous Q8H Rai, Ripudeep K, MD 12.5 mL/hr at 01/09/20 0355 3.375 g at 01/09/20 0355  . remdesivir 100 mg in sodium chloride 0.9 % 100 mL IVPB  100 mg Intravenous Daily Rai, Ripudeep K, MD 200 mL/hr at 01/08/20 0939 100 mg at 01/08/20 0939  . sodium chloride 0.9 % bolus 1,000 mL  1,000 mL Intravenous Once Mendel Corning, MD   Stopped at 01/05/20 2120  . tamsulosin (FLOMAX) capsule 0.4 mg  0.4 mg Oral BID Rai, Ripudeep K, MD   0.4 mg at 01/08/20 2225  . thiamine tablet 100 mg  100 mg Oral Daily Rai, Ripudeep K, MD   100 mg at 01/08/20 0930  . zinc sulfate capsule 220 mg  220 mg Oral Daily Rai, Ripudeep K, MD   220 mg at 01/08/20 8022     Discharge Medications: Please see discharge summary for a list of discharge medications.  Relevant Imaging Results:  Relevant Lab Results:   Additional [employer], LCSW

## 2020-01-09 NOTE — Progress Notes (Signed)
PROGRESS NOTE                                                                                                                                                                                                             Patient Demographics:    Anthony Curry, is a 79 y.o. male, DOB - 16-May-1941, OIZ:[id number]  Outpatient Primary MD for the patient is Anthony Dus, MD   Admit date - 01/02/2020   LOS - 7  Chief Complaint  Patient presents with  . Abdominal Pain       Brief Narrative: Patient is a 79 y.o. 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  Subsequent hospital course was complicated by ischemic left hand-patient underwent left brachial embolectomy on 2/1.  See below for further details   Subjective:    Anthony Curry was lying comfortably in bed-his main complaint is that he continues to have pain at the operative site.  His urine is now clear with no evidence of hematuria.   Assessment  & Plan :   Acute diverticulitis with perforation-s/p exploratory laparotomy with sigmoid resection and colostomy creation on 1/31: General surgery following-diet being advanced to soft today-JP drain in place-General surgery planning to discontinue drain tomorrow.  Remains on IV Zosyn.  Diet being advanced-JP drain in place-on IV Zosyn.  Antibiotics: Zosyn: 1/28>>  Ischemia to left hand-s/p left subclavian axillary brachial radial ulnar embolectomy on 2/1: Vascular surgery has signed off-remains on IV heparin-suspect can be switched to Eliquis today.  Recent echo on 01/02/2020 with preserved EF without  any embolic source-but patient does have a history of PAF.  Acute Hypoxic Resp Failure due to Covid 19 Viral pneumonia: Improving-continue attempts to titrate off oxygen-last day of remdesivir on 2/4.  CRP still elevated but trending down-crease CRP probably due to  to surgical issues rather than active Covid.    Fever: afebrile  O2 requirements:  SpO2: 93 % O2 Flow Rate (L/min): 2 L/min   COVID-19 Labs: Recent Labs    01/07/20 0421 01/08/20 0505 01/09/20 0330  DDIMER 2.55* 2.31* 2.35*  FERRITIN 348* 377* 358*  CRP  16.8* 10.6* 9.1*       Component Value Date/Time   BNP 109.6 (H) 01/05/2020 1119    No results for input(s): PROCALCITON in the last 168 hours.  Lab Results  Component Value Date   SARSCOV2NAA POSITIVE (A) 01/02/2020   SARSCOV2NAA Detected (A) 01/01/2020   [place] Not Detected 12/27/2019   [place] Not Detected 11/25/2019     COVID-19 Medications: Remdesivir: 1/31>> 2/4 Convalescent Plasma: 1/31 x 1  Prone/Incentive Spirometry: encouraged  incentive spirometry use 3-4/hour.  DVT Prophylaxis  : IV heparin  Pericardial effusion: Previously seen by cardiology with recommendations for outpatient follow-up-repeat echocardiogram on 2/3-with no tamponade physiology.  Etiology of effusion is likely from cancer-and I suspect is now stable to be followed by oncology/PCP in the outpatient setting.  PAF: Rate controlled-continue Cardizem-was on IV heparin-since repeat echo on 2/3 does not show any tamponade physiology-should be okay to switch to Eliquis today.   Hematuria: Resolved  History of SIADH: Likely secondary to underlying lung cancer-sodium level stable-on demeclocycline  Hypokalemia: Treated-recheck tomorrow.  Hypothyroidism: Continue Synthroid-change back to oral route  History of non-small cell cancer of the lung: Follows with Dr. Earlie Curry  Deconditioning/debility: Secondary to acute illness-evaluated by PT services-initially recommendations  for for home health-but now recommendations are for SNF.   Other issues: Has Foley catheter in place-we will discontinue today.  Nutrition Problem: Nutrition Problem: Increased nutrient needs Etiology: acute illness, catabolic UXLKGMW(NUUVO-53 infection) Signs/Symptoms: estimated needs Interventions: Refer to RD note for recommendations  Obesity: Estimated body mass index is 20.22 kg/m as calculated from the following:   Height as of this encounter: 5\' 11"  (1.803 m).   Weight as of this encounter: 65.8 kg.   Consults  : CCS, cardiology  Procedures  : See above  ABG:    Component Value Date/Time   HCO3 25.5 (H) 05/29/2007 1403   TCO2 27 05/29/2007 1403    Vent Settings: N/A  Condition - Guarded  Family Communication  :  Daughter updated over the phone 2/4  Code Status :  Full Code  Diet :  Diet Order            DIET SOFT Room service appropriate? Yes; Fluid consistency: Thin  Diet effective now               Disposition Plan  :  Remain hospitalized-probably SNF on discharge-we will consult social work.  Barriers to discharge: Complete 5 days of remdesivir-remains on IV Zosyn-JP drain in place-diet being advanced-and may need SNF placement on discharge.  Antimicorbials  :    Anti-infectives (From admission, onward)   Start     Dose/Rate Route Frequency Ordered Stop   01/06/20 1000  remdesivir 100 mg in sodium chloride 0.9 % 100 mL IVPB     100 mg 200 mL/hr over 30 Minutes Intravenous Anthony 01/05/20 0846 01/09/20 0942   01/05/20 1000  remdesivir 200 mg in sodium chloride 0.9% 250 mL IVPB     200 mg 580 mL/hr over 30 Minutes Intravenous Once 01/05/20 0846 01/05/20 1002   01/02/20 2000  piperacillin-tazobactam (ZOSYN) IVPB 3.375 g     3.375 g 12.5 mL/hr over 240 Minutes Intravenous Every 8 hours 01/02/20 1415 01/10/20 2359   01/02/20 1500  demeclocycline (DECLOMYCIN) tablet 150 mg    Note to Pharmacy: Take 2 tablets twice a day     150 mg Oral 2 times Anthony  01/02/20 1408     01/02/20 1345  piperacillin-tazobactam (ZOSYN) IVPB 3.375 g  3.375 g 100 mL/hr over 30 Minutes Intravenous  Once 01/02/20 1331 01/02/20 1528      Inpatient Medications  Scheduled Meds: . acetaminophen  1,000 mg Oral Q6H  . vitamin C  500 mg Oral BID  . chlorhexidine  15 mL Mouth Rinse BID  . Chlorhexidine Gluconate Cloth  6 each Topical Anthony  . demeclocycline  150 mg Oral BID  . diltiazem  180 mg Oral Q1200  . feeding supplement (ENSURE ENLIVE)  237 mL Oral BID BM  . feeding supplement (PRO-STAT SUGAR FREE 64)  30 mL Oral BID  . fluticasone  2 spray Each Nare QHS  . gabapentin  200 mg Oral BID  . Ipratropium-Albuterol  1 puff Inhalation Q6H  . levothyroxine  50 mcg Oral QAC breakfast  . mouth rinse  15 mL Mouth Rinse q12n4p  . methocarbamol  750 mg Oral TID  . mirtazapine  30 mg Oral QHS  . mometasone-formoterol  2 puff Inhalation BID  . polyethylene glycol  17 g Oral BID  . tamsulosin  0.4 mg Oral BID  . thiamine  100 mg Oral Anthony  . zinc sulfate  220 mg Oral Anthony   Continuous Infusions: . sodium chloride 250 mL (01/06/20 2225)  . heparin 900 Units/hr (01/09/20 3354)  . piperacillin-tazobactam (ZOSYN)  IV 3.375 g (01/09/20 0355)  . sodium chloride Stopped (01/05/20 2120)   PRN Meds:.sodium chloride, albuterol, ALPRAZolam, hydrALAZINE, HYDROmorphone (DILAUDID) injection, ondansetron (ZOFRAN) IV, oxyCODONE   Time Spent in minutes  25  See all Orders from today for further details   Oren Binet M.D on 01/09/2020 at 11:14 AM  To page go to [email or web address] - use universal password  [facility] -  Office  [phone]    Objective:   Vitals:   01/09/20 0000 01/09/20 0005 01/09/20 0400 01/09/20 0800  BP:  131/70 (!) 127/51 (!) 147/55  Pulse: 72 70 71 69  Resp: (!) 26 15 17 16   Temp:  99.1 F (37.3 C) 98.7 F (37.1 C) 98.4 F (36.9 C)  TempSrc:  Oral Oral Oral  SpO2: 93% 95% 94% 93%  Weight:      Height:        Wt Readings  from Last 3 Encounters:  01/02/20 65.8 kg  12/02/19 66.6 kg  10/29/19 65 kg     Intake/Output Summary (Last 24 hours) at 01/09/2020 1114 Last data filed at 01/09/2020 0800 Gross per 24 hour  Intake 1092.13 ml  Output 1835 ml  Net -742.87 ml     Physical Exam Gen Exam:Alert awake-not in any distress HEENT:atraumatic, normocephalic Chest: B/L clear to auscultation anteriorly CVS:S1S2 regular Abdomen: Soft-appropriately tender at the operative site-ostomy present. Extremities:no edema Neurology: Non focal Skin: no rash   Data Review:    CBC Recent Labs  Lab 01/05/20 2035 01/06/20 0540 01/07/20 0421 01/08/20 0505 01/09/20 0330  WBC 14.9* 13.8* 13.4* 9.9 10.3  HGB 12.0* 12.1* 11.2* 11.5* 12.0*  HCT 34.7* 35.6* 32.4* 32.2* 34.9*  PLT 227 220 273 277 317  MCV 88.7 91.8 89.3 87.0 88.6  MCH 30.7 31.2 30.9 31.1 30.5  MCHC 34.6 34.0 34.6 35.7 34.4  RDW 14.1 14.3 14.2 14.0 14.1  LYMPHSABS 0.1*  --   --   --   --   MONOABS 0.6  --   --   --   --   EOSABS 0.0  --   --   --   --   BASOSABS 0.0  --   --   --   --  Chemistries  Recent Labs  Lab 01/03/20 0330 01/03/20 0330 01/04/20 0225 01/05/20 0340 01/06/20 0540 01/07/20 0421 01/08/20 0505  NA 131*   < > 131* 130* 132* 134* 132*  K 4.3   < > 3.7 3.3* 4.1 3.8 3.4*  CL 98   < > 100 101 102 104 100  CO2 23   < > 21* 21* 21* 24 23  GLUCOSE 146*   < > 107* 83 148* 109* 95  BUN 16   < > 20 18 20  27* 24*  CREATININE 0.82   < > 0.81 0.72 0.73 0.79 0.70  CALCIUM 8.7*   < > 8.3* 7.7* 8.1* 8.0* 7.6*  AST 23  --  25 21  --   --   --   ALT 18  --  15 16  --   --   --   ALKPHOS 71  --  63 64  --   --   --   BILITOT 0.9  --  1.3* 1.0  --   --   --    < > = values in this interval not displayed.   ------------------------------------------------------------------------------------------------------------------ No results for input(s): CHOL, HDL, LDLCALC, TRIG, CHOLHDL, LDLDIRECT in the last 72 hours.  No results found  for: HGBA1C ------------------------------------------------------------------------------------------------------------------ No results for input(s): TSH, T4TOTAL, T3FREE, THYROIDAB in the last 72 hours.  Invalid input(s): FREET3 ------------------------------------------------------------------------------------------------------------------ Recent Labs    01/08/20 0505 01/09/20 0330  FERRITIN 377* 358*    Coagulation profile No results for input(s): INR, PROTIME in the last 168 hours.  Recent Labs    01/08/20 0505 01/09/20 0330  DDIMER 2.31* 2.35*    Cardiac Enzymes No results for input(s): CKMB, TROPONINI, MYOGLOBIN in the last 168 hours.  Invalid input(s): CK ------------------------------------------------------------------------------------------------------------------    Component Value Date/Time   BNP 109.6 (H) 01/05/2020 1119    Micro Results Recent Results (from the past 240 hour(s))  Novel Coronavirus, NAA ([facility])     Status: Abnormal   Collection Time: 01/01/20  2:24 PM   Specimen: Nasopharyngeal(NP) swabs in vial transport medium   NASOPHARYNGE  TESTING  Result Value Ref Range Status   SARS-CoV-2, NAA Detected (A) Not Detected Final    Comment: This nucleic acid amplification test was developed and its performance characteristics determined by [employer]. Nucleic acid amplification tests include RT-PCR and TMA. This test has not been FDA cleared or approved. This test has been authorized by FDA under an Emergency Use Authorization (EUA). This test is only authorized for the duration of time the declaration that circumstances exist justifying the authorization of the emergency use of in vitro diagnostic tests for detection of SARS-CoV-2 virus and/or diagnosis of COVID-19 infection under section 564(b)(1) of the Act, 21 U.S.C. 161WRU-0(A) (1), unless the authorization is terminated or revoked sooner. When diagnostic testing is negative,  the possibility of a false negative result should be considered in the context of a patient's recent exposures and the presence of clinical signs and symptoms consistent with COVID-19. An individual without symptoms of COVID-19 and who is not shedding SARS-CoV-2 virus wo uld expect to have a negative (not detected) result in this assay.   SARS CORONAVIRUS 2 (TAT 6-24 HRS) Nasopharyngeal Nasopharyngeal Swab     Status: Abnormal   Collection Time: 01/02/20  2:29 PM   Specimen: Nasopharyngeal Swab  Result Value Ref Range Status   SARS Coronavirus 2 POSITIVE (A) NEGATIVE Final    Comment: RESULT CALLED TO, READ BACK BY AND VERIFIED WITH:  T BAKER,RN 2015 01/02/2020 D BRADLEY (NOTE) SARS-CoV-2 target nucleic acids are DETECTED. The SARS-CoV-2 RNA is generally detectable in upper and lower respiratory specimens during the acute phase of infection. Positive results are indicative of the presence of SARS-CoV-2 RNA. Clinical correlation with patient history and other diagnostic information is  necessary to determine patient infection status. Positive results do not rule out bacterial infection or co-infection with other viruses.  The expected result is Negative. Fact Sheet for Patients: SugarRoll.be Fact Sheet for Healthcare Providers: https://www.woods-mathews.com/ This test is not yet approved or cleared by the [place] FDA and  has been authorized for detection and/or diagnosis of SARS-CoV-2 by FDA under an Emergency Use Authorization (EUA). This EUA will remain  in effect (meaning this test can be used) for the  duration of the COVID-19 declaration under Section 564(b)(1) of the Act, 21 U.S.C. section 360bbb-3(b)(1), unless the authorization is terminated or revoked sooner. Performed at [facility], [place] [address] [place], [place] [address]   Culture, blood (routine x 2)     Status: None   Collection Time: 01/03/20 10:10 PM    Specimen: BLOOD  Result Value Ref Range Status   Specimen Description   Final    BLOOD RIGHT ANTECUBITAL Performed at [facility], [place] [address], [place], [place] [address]    Special Requests   Final    BOTTLES DRAWN AEROBIC AND ANAEROBIC Blood Culture adequate volume Performed at [facility] [address] [place], [place] [address]    Culture   Final    NO GROWTH 5 DAYS Performed at [facility], [place] [address], [place], [place] [address]    Report Status 01/08/2020 FINAL  Final  Culture, blood (routine x 2)     Status: None   Collection Time: 01/03/20 10:10 PM   Specimen: BLOOD  Result Value Ref Range Status   Specimen Description   Final    BLOOD LEFT HAND Performed at [facility] [address], [place], [place] [address]    Special Requests   Final    BOTTLES DRAWN AEROBIC AND ANAEROBIC Blood Culture adequate volume Performed at [facility] [address], [place], [place] [address]    Culture   Final    NO GROWTH 5 DAYS Performed at [facility], [place] [address], [place], [place] [address]    Report Status 01/08/2020 FINAL  Final  Surgical pcr screen     Status: None   Collection Time: 01/05/20  2:45 PM   Specimen: Nasal Mucosa; Nasal Swab  Result Value Ref Range Status   MRSA, PCR NEGATIVE NEGATIVE Final   Staphylococcus aureus NEGATIVE NEGATIVE Final    Comment: (NOTE) The Xpert SA Assay (FDA approved for NASAL specimens in patients 35 years of age and older), is one component of a comprehensive surveillance program. It is not intended to diagnose infection nor to guide or monitor treatment. Performed at [facility], [place] [address] [place], [place] [address]   Culture, blood (routine x 2)     Status: None (Preliminary result)   Collection Time: 01/06/20  8:15 AM   Specimen: BLOOD RIGHT HAND  Result Value Ref Range Status   Specimen Description   Final     BLOOD RIGHT HAND Performed at [facility] [address], [place], [place] [address]    Special Requests   Final    BOTTLES DRAWN AEROBIC AND  ANAEROBIC Blood Culture adequate volume Performed at [facility] [address], [place], [place] [address]    Culture   Final    NO GROWTH 3 DAYS Performed at [facility], [place] [address] [place], [place] [address]    Report Status PENDING  Incomplete  Culture, blood (routine x 2)     Status: None (Preliminary result)   Collection Time: 01/06/20  8:15 AM   Specimen: BLOOD  Result Value Ref Range Status   Specimen Description   Final    BLOOD RIGHT ARM Performed at [facility] [address] [place], [place] [address]    Special Requests   Final    BOTTLES DRAWN AEROBIC AND ANAEROBIC Blood Culture adequate volume Performed at [facility] [address] [place], [place] [address]    Culture   Final    NO GROWTH 3 DAYS Performed at [facility], [place] [address], [place],  [address]    Report Status PENDING  Incomplete    Radiology Reports DG Chest 2 View  Result Date: 01/05/2020 CLINICAL DATA:  Patient with stage IV lung cancer. Left lower quadrant abdominal pain. Diarrhea. EXAM: CHEST - 2 VIEW COMPARISON:  CT scan from January 05, 2020 at 11:39 a.m. FINDINGS: Free air seen under the diaphragm, also appreciated on the CT scan from earlier today. Healed right rib fractures. Mild infiltrate in the medial right lung base, more prominent since January 02, 2020. Pleural thickening versus small left pleural effusion. Atelectasis or scar in the left base. The heart, hila, mediastinum, lungs, and pleura are otherwise unchanged. IMPRESSION: 1. Free air under the diaphragm. This finding was seen on the CT scan from earlier today and called to Dr. Tana Coast by Dr. Jobe Igo at 12:30 p.m. 2. Mild opacity in the right base is likely atelectasis based on CT imaging  from earlier today. 3. No other changes. Electronically Signed   By: Dorise Bullion III M.D   On: 01/05/2020 12:43   CT ABDOMEN PELVIS W CONTRAST  Result Date: 01/05/2020 CLINICAL DATA:  Nausea and vomiting. Acute diverticulitis. Worsening pain. Fevers. EXAM: CT ABDOMEN AND PELVIS WITH CONTRAST TECHNIQUE: Multidetector CT imaging of the abdomen and pelvis was performed using the standard protocol following bolus administration of intravenous contrast. CONTRAST:  154mL OMNIPAQUE IOHEXOL 300 MG/ML  SOLN COMPARISON:  01/02/2020 FINDINGS: Lower chest: Bibasilar atelectasis. Normal heart size with small chronic pericardial effusion. Small bilateral pleural effusions, new. Multivessel coronary artery atherosclerosis. Hepatobiliary: Scattered well-circumscribed low-density liver lesions are likely cysts. Normal gallbladder, without biliary ductal dilatation. Pancreas: Normal, without mass or ductal dilatation. Spleen: Normal in size, without focal abnormality. Adrenals/Urinary Tract: Normal left adrenal gland. Mild right adrenal thickening. Right renal cortical thinning. No hydronephrosis. Decompressed urinary bladder around a Foley catheter. Stomach/Bowel: Normal stomach, without wall thickening. Scattered colonic diverticula. Increased wall thickening involving the proximal sigmoid, including on 67/2. Mid small bowel loops measure up to 2.4 cm. Pelvic small bowel loops are mildly thickened, including on 73/2. Vascular/Lymphatic: Advanced aortic and branch vessel atherosclerosis. No abdominopelvic adenopathy. Reproductive: Normal prostate. Other: New small volume cul-de-sac fluid, including on 71/2. Extensive, moderate volume free intraperitoneal air, including within the right upper quadrant on 27/2 and adjacent the right lobe of the liver on 29/2. Multiple small locules of pelvic free air, including on 61/2. Enhancing small volume fluid within the anterior left pelvis on 66/2 and up to 3.8 x 1.5 cm just inferior to  this  on 71/2. Musculoskeletal: Bilateral remote rib fractures. Lumbosacral spondylosis. IMPRESSION: 1. Moderate free intraperitoneal air, likely due to complicated sigmoid diverticulitis. Small volume pericolonic fluid and enhancement, suspicious for infected ascites. 2. Borderline small bowel dilatation, suggesting adynamic ileus. Small bowel wall thickening within the pelvis is likely due to secondary enteritis. 3. New small bilateral pleural effusions with similar pericardial effusion. 4. Coronary artery atherosclerosis. Aortic Atherosclerosis (ICD10-I70.0). Critical test results telephoned to Dr. Tana Coast. At the time of interpretation at 12:30 p.m.On 01/05/2020. Electronically Signed   By: Abigail Miyamoto M.D.   On: 01/05/2020 12:32   CT ABDOMEN PELVIS W CONTRAST  Result Date: 01/02/2020 CLINICAL DATA:  Lower abdominal pain. The patient is on immunotherapy for lung carcinoma. EXAM: CT ABDOMEN AND PELVIS WITH CONTRAST TECHNIQUE: Multidetector CT imaging of the abdomen and pelvis was performed using the standard protocol following bolus administration of intravenous contrast. CONTRAST:  100 mL OMNIPAQUE IOHEXOL 300 MG/ML  SOLN COMPARISON:  CT chest, abdomen and pelvis 05/13/2019. FINDINGS: Lower chest: No pleural effusion. Small pericardial effusion is unchanged. Lung bases are emphysematous. There is mild dependent atelectasis. Hepatobiliary: A few small low attenuating lesions in the liver likely representing cysts are unchanged. Status post cholecystectomy. Biliary tree unremarkable. Pancreas: Unremarkable. No pancreatic ductal dilatation or surrounding inflammatory changes. Spleen: Normal in size without focal abnormality. 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 The left adrenal gland appears normal. There is some scarring in the right kidney which is unchanged. The kidneys otherwise appear normal. No stones or hydronephrosis. The urinary bladder is distended but otherwise unremarkable.  Stomach/Bowel: Diverticulosis is identified. There is thickening and stranding near the junction of the descending and sigmoid consistent with acute diverticulitis. No abscess or perforation. The stomach and small bowel appear normal. The appendix is normal in appearance. Note is made that the cecum is positioned in the right upper quadrant. Vascular/Lymphatic: Aortic atherosclerosis. No enlarged abdominal or pelvic lymph nodes. Reproductive: Prostate is unremarkable. Other: None. Musculoskeletal: No acute or focal abnormality. Severe degenerative change throughout the visualized spine is noted. IMPRESSION: Diverticulosis with acute diverticulitis the junction of the distal descending and sigmoid colon. No abscess or perforation. No change in a moderate pericardial effusion. Emphysema. Extensive atherosclerosis. Electronically Signed   By: Inge Rise M.D.   On: 01/02/2020 13:10   DG CHEST PORT 1 VIEW  Result Date: 01/02/2020 CLINICAL DATA:  79 year old male with stage IV lung cancer, SI ADH. Undergoing chemotherapy. Positive COVID-19 yesterday. EXAM: PORTABLE CHEST 1 VIEW COMPARISON:  CT Abdomen and Pelvis earlier today. Chest CT 09/02/2019 and earlier. FINDINGS: Portable AP semi upright view at 1741 hours. Stable lung volumes and mediastinal contours since 2020. Numerous chronic right rib fractures. Chronic reticular opacity in the right upper lobe and near the left hilum. Elsewhere when allowing for portable technique the lungs are clear. No pneumothorax, pulmonary edema or pleural effusion. Stable visualized osseous structures. IMPRESSION: Stable radiographic appearance of chronic lung disease since July 2020. No acute cardiopulmonary abnormality. Electronically Signed   By: Genevie Ann M.D.   On: 01/02/2020 18:38   VAS [place] UPPER EXTREMITY ARTERIAL DUPLEX  Result Date: 01/06/2020 UPPER EXTREMITY DUPLEX STUDY Indications: Patient complains of cool left upper extremity with pallor. History:     Patient has  a history ofrecent left upper extremity arterial line.  Comparison Study: No prior study. Performing Technologist: Maudry Mayhew MHA, RDMS, RVT, RDCS  Examination Guidelines: A complete evaluation includes B-mode imaging, spectral Doppler, color Doppler, and power Doppler as needed  of all accessible portions of each vessel. Bilateral testing is considered an integral part of a complete examination. Limited examinations for reoccurring indications may be performed as noted.  Right Doppler Findings: +---------------+----------+--------+--------+--------+ Site           PSV (cm/s)WaveformStenosisComments +---------------+----------+--------+--------+--------+ Subclavian [id number]       biphasic                 +---------------+----------+--------+--------+--------+  Left Doppler Findings: +---------------+----------+--------------+--------+--------+ Site           PSV (cm/s)Waveform      StenosisComments +---------------+----------+--------------+--------+--------+ Subclavian Prox17        monophasic                     +---------------+----------+--------------+--------+--------+ Subclavian Mid 12        monophasic                     +---------------+----------+--------------+--------+--------+ Axillary       15.00     monophasic                     +---------------+----------+--------------+--------+--------+ Brachial Prox  14        monophasic                     +---------------+----------+--------------+--------+--------+ Brachial Dist  11        monophasic                     +---------------+----------+--------------+--------+--------+ Radial Prox    4         barely audible                 +---------------+----------+--------------+--------+--------+ Radial Dist              barely audible                 +---------------+----------+--------------+--------+--------+ Ulnar Dist     10        barely audible                  +---------------+----------+--------------+--------+--------+ Palmar Arch    6         barely audible                 +---------------+----------+--------------+--------+--------+   Summary:  Right: No obstruction visualized in the right subclavian artery. Left: Monophasic flow at the proximal left subclavian artery is       suggestive of proximal obstruction. Radial, ulnar, and palmar       arch signals are patent, however signals are barely       detectable. *See table(s) above for measurements and observations. Electronically signed by Ruta Hinds MD on 01/06/2020 at 5:06:56 PM.    Final    ECHOCARDIOGRAM COMPLETE  Result Date: 01/02/2020   ECHOCARDIOGRAM REPORT   Patient Name:   Anthony Curry. Date of Exam: 01/02/2020 Medical Rec #:  [id number]           Height:       71.0 in Accession #:    [id number]          Weight:       145.0 lb Date of Birth:  1941-02-23          BSA:          1.84 m Patient Age:    20 years            BP:  152/65 mmHg Patient Gender: M                   HR:           82 bpm. Exam Location:  Inpatient Procedure: 2D Echo, Cardiac Doppler and Color Doppler Indications:    Pericardial effusion 423.9  History:        Patient has prior history of Echocardiogram examinations, most                 recent 07/26/2014. Arrythmias:Atrial Fibrillation and Atrial                 Flutter; Risk Factors:Hypertension and Former Smoker. GERD.  Sonographer:    Paulita Fujita RDCS Referring Phys: [id number] AMRIT ADHIKARI  Sonographer Comments: Image acquisition challenging due to patient body habitus. IMPRESSIONS  1. Left ventricular ejection fraction, by visual estimation, is 60 to 65%. The left ventricle has normal function. There is no left ventricular hypertrophy.  2. Left ventricular diastolic parameters are consistent with Grade I diastolic dysfunction (impaired relaxation).  3. Global right ventricle has normal systolic function.The right ventricular size is normal. No increase in  right ventricular wall thickness.  4. Left atrial size was normal.  5. Right atrial size was normal.  6. Large pericardial effusion.  7. The pericardial effusion is anterior to the right ventricle.  8. There is inversion of the right ventricular wall and excessive respiratory variation in the tricuspid valve spectral Doppler velocities.  9. The mitral valve is normal in structure. Mild mitral valve regurgitation. No evidence of mitral stenosis. 10. The tricuspid valve is normal in structure. 11. The tricuspid valve is normal in structure. Tricuspid valve regurgitation is mild. 12. The aortic valve is normal in structure. Aortic valve regurgitation is not visualized. No evidence of aortic valve sclerosis or stenosis. 13. The pulmonic valve was normal in structure. Pulmonic valve regurgitation is not visualized. 14. Mildly elevated pulmonary artery systolic pressure. 15. The inferior vena cava is normal in size with greater than 50% respiratory variability, suggesting right atrial pressure of 3 mmHg. FINDINGS  Left Ventricle: Left ventricular ejection fraction, by visual estimation, is 60 to 65%. The left ventricle has normal function. The left ventricle is not well visualized. There is no left ventricular hypertrophy. Left ventricular diastolic parameters are consistent with Grade I diastolic dysfunction (impaired relaxation). Indeterminate filling pressures. Right Ventricle: The right ventricular size is normal. No increase in right ventricular wall thickness. Global RV systolic function is has normal systolic function. The tricuspid regurgitant velocity is 2.46 m/s, and with an assumed right atrial pressure  of 8 mmHg, the estimated right ventricular systolic pressure is mildly elevated at 32.2 mmHg. Left Atrium: Left atrial size was normal in size. Right Atrium: Right atrial size was normal in size Pericardium: A large pericardial effusion is present. The pericardial effusion is anterior to the right ventricle.  There is inversion of the right ventricular wall and excessive respiratory variation in the tricuspid valve spectral Doppler velocities. Pericardial effusion measuring up to 1.86 cm. Unable to visualize the IVC. Findings may be con. Mitral Valve: The mitral valve is normal in structure. Mild mitral valve regurgitation. No evidence of mitral valve stenosis by observation. Tricuspid Valve: The tricuspid valve is normal in structure. Tricuspid valve regurgitation is mild. Aortic Valve: The aortic valve is normal in structure. Aortic valve regurgitation is not visualized. The aortic valve is structurally normal, with no evidence of sclerosis or stenosis. Pulmonic Valve: The pulmonic  valve was normal in structure. Pulmonic valve regurgitation is not visualized. Pulmonic regurgitation is not visualized. Aorta: The aortic root, ascending aorta and aortic arch are all structurally normal, with no evidence of dilitation or obstruction. Venous: The inferior vena cava was not well visualized. The inferior vena cava is normal in size with greater than 50% respiratory variability, suggesting right atrial pressure of 3 mmHg. IAS/Shunts: No atrial level shunt detected by color flow Doppler. There is no evidence of a patent foramen ovale. No ventricular septal defect is seen or detected. There is no evidence of an atrial septal defect.   Diastology LV e' lateral:   5.87 cm/s LV E/e' lateral: 11.5 LV e' medial:    4.90 cm/s LV E/e' medial:  13.7  RIGHT VENTRICLE RV S prime:     28.60 cm/s TAPSE (M-mode): 2.1 cm LEFT ATRIUM             Index       RIGHT ATRIUM           Index LA Vol (A2C):   30.0 ml 16.31 ml/m RA Area:     12.10 cm LA Vol (A4C):   21.2 ml 11.53 ml/m RA Volume:   30.10 ml  16.37 ml/m LA Biplane Vol: 25.3 ml 13.76 ml/m  AORTIC VALVE LVOT Vmax:   70.30 cm/s LVOT Vmean:  50.200 cm/s LVOT VTI:    0.137 m MITRAL VALVE                        TRICUSPID VALVE MV Area (PHT): 2.96 cm             TR Peak grad:   24.2 mmHg  MV PHT:        74.24 msec           TR Vmax:        246.00 cm/s MV Decel Time: 256 msec MV E velocity: 67.30 cm/s 103 cm/s  SHUNTS MV A velocity: 65.10 cm/s 70.3 cm/s Systemic VTI: 0.14 m MV E/A ratio:  1.03       1.5  Skeet Latch MD Electronically signed by Skeet Latch MD Signature Date/Time: 01/02/2020/6:33:38 PM    Final    ECHOCARDIOGRAM LIMITED  Result Date: 01/08/2020   ECHOCARDIOGRAM LIMITED REPORT   Patient Name:   Anthony Curry. Date of Exam: 01/08/2020 Medical Rec #:  [id number]           Height:       71.0 in Accession #:    [id number]          Weight:       145.0 lb Date of Birth:  01/01/1941          BSA:          1.84 m Patient Age:    89 years            BP:           157/66 mmHg Patient Gender: M                   HR:           71 bpm. Exam Location:  Inpatient  Procedure: Limited Echo, Limited Color Doppler and Color Doppler Indications:    Pericardial Effusion  History:        Patient has prior history of Echocardiogram examinations, most  recent 01/02/2020. Covid-19 Positive  Sonographer:    Mikki Santee RDCS (AE) Referring Phys: Eunice Blase VIJAYA AKULA IMPRESSIONS  1. Left ventricular ejection fraction, by visual estimation, is 55 to 60%.  2. Moderate to large pericardial effusion. No significant change since the last exam 01/02/20 on side by side comparison. No RV diastolic collapse. Suboptimal Doppler alignment of mitral inflow, no definite respiratory variability. Hypertensive and normal heart rate. These findings suggest no evidence of tamponade physiology. Hepatic vein Doppler not performed. IVC not visualized. Borderline respiratory related leftward ventricular septal shift. FINDINGS  Left Ventricle: Left ventricular ejection fraction, by visual estimation, is 55 to 60%. Pericardium: Moderate to large pericardial effusion is seen. Moderate to large pericardial effusion.   Diastology                 Normals LV e' lateral:   8.27 cm/s 6.42 cm/s LV E/e' lateral: 11.8       15.4 LV e' medial:    9.46 cm/s 6.96 cm/s LV E/e' medial:  10.3      6.96  MITRAL VALVE              Normals   TRICUSPID VALVE             Normals MV Area (PHT): 3.72 cm             TR Peak grad:   24.2 mmHg MV PHT:        59.16 msec 55 ms     TR Vmax:        246.00 cm/s 288 cm/s MV Decel Time: 204 msec   187 ms MV E velocity: 97.30 cm/s 103 cm/s MV A velocity: 90.00 cm/s 70.3 cm/s MV E/A ratio:  1.08       1.5  Cherlynn Kaiser MD Electronically signed by Cherlynn Kaiser MD Signature Date/Time: 01/08/2020/7:20:57 PM    Final    HYBRID OR IMAGING ([place])  Result Date: 01/06/2020 There is no interpretation for this exam.  This order is for images obtained during a surgical procedure.  Please See "Surgeries" Tab for more information regarding the procedure.

## 2020-01-09 NOTE — TOC Progression Note (Signed)
Transition of Care (TOC) - Progression Note    Patient Details  Name: Anthony Curry. MRN: [id number] Date of Birth: May 18, 1941  Transition of Care [facility]) CM/SW [place], [place] Phone Number: 01/09/2020, 1:34 PM  Clinical Narrative:    CSW spoke with patient's daughter Anthony Curry [phone]. CSW provided the facilities that have accepted patient, as well as pending facilities. Patient's daughter stated she would prefer SNF in [place] and requested to follow-up with CSW after she is able to research the facilities.     Expected Discharge Plan: Skilled Nursing Facility Barriers to Discharge: Continued Medical Work up  Expected Discharge Plan and Services Expected Discharge Plan: [place]                                               Social Determinants of Health (SDOH) Interventions    Readmission Risk Interventions Readmission Risk Prevention Plan 01/06/2020  Transportation Screening Complete  Medication Review Press photographer) Complete  Some recent data might be hidden

## 2020-01-09 NOTE — Plan of Care (Signed)
  Problem: Education: Goal: Knowledge of risk factors and measures for prevention of condition will improve Outcome: Progressing   Problem: Coping: Goal: Psychosocial and spiritual needs will be supported Outcome: Progressing   Problem: Respiratory: Goal: Will maintain a patent airway Outcome: Progressing Goal: Complications related to the disease process, condition or treatment will be avoided or minimized Outcome: Progressing   Problem: Education: Goal: Knowledge of General Education information will improve Description: Including pain rating scale, medication(s)/side effects and non-pharmacologic comfort measures Outcome: Progressing   Problem: Clinical Measurements: Goal: Diagnostic test results will improve Outcome: Progressing   Problem: Safety: Goal: Ability to remain free from injury will improve Outcome: Progressing

## 2020-01-09 NOTE — Progress Notes (Signed)
Patient ID: Anthony Curry., male   DOB: [birth year], 79 y.o.   MRN: [id number]    3 Days Post-Op  Subjective: Patient seems to be doing well today.  Still with some pain control issues.  Tolerating full liquids with no nausea or vomiting.  Denies SOB except with mobilization  ROS: See above, otherwise other systems negative  Objective: Vital signs in last 24 hours: Temp:  [97.7 F (36.5 C)-99.1 F (37.3 C)] 98.4 F (36.9 C) (02/04 0800) Pulse Rate:  [67-72] 69 (02/04 0800) Resp:  [14-26] 16 (02/04 0800) BP: (127-157)/(51-70) 147/55 (02/04 0800) SpO2:  [90 %-97 %] 93 % (02/04 0800) Last BM Date: 01/07/20  Intake/Output from previous day: 02/03 0701 - 02/04 0700 In: 1452.1 [P.O.:720; I.V.:434.5; IV Piggyback:297.6] Out: 2145 [Urine:1950; Drains:195] Intake/Output this shift: Total I/O In: -  Out: 85 [Drains:40]  PE: Abd: soft, ND, appropriately tender, +BS, midline incision is clean and packed.  Stoma is pink and viable.  Bag with air, but no stool currently.  JP with serosang output  Lab Results:  Recent Labs    01/08/20 0505 01/09/20 0330  WBC 9.9 10.3  HGB 11.5* 12.0*  HCT 32.2* 34.9*  PLT 277 317   BMET Recent Labs    01/07/20 0421 01/08/20 0505  NA 134* 132*  K 3.8 3.4*  CL 104 100  CO2 24 23  GLUCOSE 109* 95  BUN 27* 24*  CREATININE 0.79 0.70  CALCIUM 8.0* 7.6*   PT/INR No results for input(s): LABPROT, INR in the last 72 hours. CMP     Component Value Date/Time   NA 132 (L) 01/08/2020 0505   NA 132 (L) 12/06/2017 0930   K 3.4 (L) 01/08/2020 0505   K 4.0 12/06/2017 0930   CL 100 01/08/2020 0505   CO2 23 01/08/2020 0505   CO2 26 12/06/2017 0930   GLUCOSE 95 01/08/2020 0505   GLUCOSE 89 12/06/2017 0930   BUN 24 (H) 01/08/2020 0505   BUN 15.4 12/06/2017 0930   CREATININE 0.70 01/08/2020 0505   CREATININE 0.97 12/02/2019 1134   CREATININE 0.9 12/06/2017 0930   CALCIUM 7.6 (L) 01/08/2020 0505   CALCIUM 9.9 12/06/2017 0930   PROT 4.9 (L)  01/05/2020 0340   PROT 6.3 (L) 12/06/2017 0930   ALBUMIN 2.3 (L) 01/05/2020 0340   ALBUMIN 3.7 12/06/2017 0930   AST 21 01/05/2020 0340   AST 17 12/02/2019 1134   AST 20 12/06/2017 0930   ALT 16 01/05/2020 0340   ALT 11 12/02/2019 1134   ALT 14 12/06/2017 0930   ALKPHOS 64 01/05/2020 0340   ALKPHOS 107 12/06/2017 0930   BILITOT 1.0 01/05/2020 0340   BILITOT 0.6 12/02/2019 1134   BILITOT 0.76 12/06/2017 0930   GFRNONAA >60 01/08/2020 0505   GFRNONAA >60 12/02/2019 1134   GFRAA >60 01/08/2020 0505   GFRAA >60 12/02/2019 1134   Lipase     Component Value Date/Time   LIPASE 19 01/02/2020 1041       Studies/Results: ECHOCARDIOGRAM LIMITED  Result Date: 01/08/2020   ECHOCARDIOGRAM LIMITED REPORT   Patient Name:   Anthony Curry. Date of Exam: 01/08/2020 Medical Rec #:  [id number]           Height:       71.0 in Accession #:    [id number]          Weight:       145.0 lb Date of Birth:  June 16, 1941  BSA:          1.84 m Patient Age:    66 years            BP:           157/66 mmHg Patient Gender: M                   HR:           71 bpm. Exam Location:  Inpatient  Procedure: Limited Echo, Limited Color Doppler and Color Doppler Indications:    Pericardial Effusion  History:        Patient has prior history of Echocardiogram examinations, most                 recent 01/02/2020. Covid-19 Positive  Sonographer:    Mikki Santee RDCS (AE) Referring Phys: Eunice Blase VIJAYA AKULA IMPRESSIONS  1. Left ventricular ejection fraction, by visual estimation, is 55 to 60%.  2. Moderate to large pericardial effusion. No significant change since the last exam 01/02/20 on side by side comparison. No RV diastolic collapse. Suboptimal Doppler alignment of mitral inflow, no definite respiratory variability. Hypertensive and normal heart rate. These findings suggest no evidence of tamponade physiology. Hepatic vein Doppler not performed. IVC not visualized. Borderline respiratory related leftward ventricular  septal shift. FINDINGS  Left Ventricle: Left ventricular ejection fraction, by visual estimation, is 55 to 60%. Pericardium: Moderate to large pericardial effusion is seen. Moderate to large pericardial effusion.   Diastology                 Normals LV e' lateral:   8.27 cm/s 6.42 cm/s LV E/e' lateral: 11.8      15.4 LV e' medial:    9.46 cm/s 6.96 cm/s LV E/e' medial:  10.3      6.96  MITRAL VALVE              Normals   TRICUSPID VALVE             Normals MV Area (PHT): 3.72 cm             TR Peak grad:   24.2 mmHg MV PHT:        59.16 msec 55 ms     TR Vmax:        246.00 cm/s 288 cm/s MV Decel Time: 204 msec   187 ms MV E velocity: 97.30 cm/s 103 cm/s MV A velocity: 90.00 cm/s 70.3 cm/s MV E/A ratio:  1.08       1.5  Cherlynn Kaiser MD Electronically signed by Cherlynn Kaiser MD Signature Date/Time: 01/08/2020/7:20:57 PM    Final     Anti-infectives: Anti-infectives (From admission, onward)   Start     Dose/Rate Route Frequency Ordered Stop   01/06/20 1000  remdesivir 100 mg in sodium chloride 0.9 % 100 mL IVPB     100 mg 200 mL/hr over 30 Minutes Intravenous Daily 01/05/20 0846 01/09/20 0942   01/05/20 1000  remdesivir 200 mg in sodium chloride 0.9% 250 mL IVPB     200 mg 580 mL/hr over 30 Minutes Intravenous Once 01/05/20 0846 01/05/20 1002   01/02/20 2000  piperacillin-tazobactam (ZOSYN) IVPB 3.375 g     3.375 g 12.5 mL/hr over 240 Minutes Intravenous Every 8 hours 01/02/20 1415 01/10/20 2359   01/02/20 1500  demeclocycline (DECLOMYCIN) tablet 150 mg    Note to Pharmacy: Take 2 tablets twice a day     150 mg Oral 2 times  daily 01/02/20 1408     01/02/20 1345  piperacillin-tazobactam (ZOSYN) IVPB 3.375 g     3.375 g 100 mL/hr over 30 Minutes Intravenous  Once 01/02/20 1331 01/02/20 1528       Assessment/Plan Acute hypoxic respiratory failure due to acute COVID-19 viral pneumonia History of stage IV non-small cell lung CA- undergoing chemo treatment, on hold Paroxysmal atrial  fibrillation- eliquis on hold, last dose 01/31 am, on heparin gtt for cold left hand Mild protein calorie malnutrition- albumin 2.3 Hematuria- per uro/primary Ischemic left hand, s/p embolectomy -on heparin gtt Pericardial effusion - per primary  POD 4,S/Pexploratory laparotomy, sigmoid resection with end colostomy creation, JP drain, Dr. Kieth Brightly, 01/54forAcute diverticulitis with perforation -soft diet -WOC RN consult for new stoma - ambulate, IS -cont JP drain, but will DC tomorrow -BID WD dressing changes to midline wound -cont zosyn, will DC tomorrow after 5 post op days.   CSP:ZZCK VTE: SCD's,heparingtt IC:HTVGV 01/28>> will DC tomorrow (put an end date in already)Remdesivir 01/31>> Foley:Dc 2/4 Follow up:Dr. Kieth Brightly   LOS: 7 days    Henreitta Cea , [facility] 01/09/2020, 9:46 AM Please see Amion for pager number during day hours 7:00am-4:30pm or 7:00am -11:30am on weekends

## 2020-01-09 NOTE — Progress Notes (Signed)
Alert and oriented 4x, up in chair today, IS used, pulling 1500 ml inspiratory effort. JP to right lower abdomen noting moderate amt of pink tinged serous drainage. Miralax started today, flatus and liquid brown BM to ostomy bag. Pain relief effective with dilaudid 1 mg prn, started on scheduled tylenol. Heparin drip discontinued, started on eliquis.

## 2020-01-09 NOTE — Progress Notes (Addendum)
Occupational Therapy Treatment Patient Details Name: Anthony Curry. MRN: [id number] DOB: 01/28/1941 Today's Date: 01/09/2020    History of present illness Pt is an 79 y.o. male admitted 01/02/20 with LLQ abdominal pain and diarrhea; also with (+) COVID-19 test. 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 Underwent L brachial, radial, ulnar embolectomies 2/1. PMH includes non-small cell lung CA (currently on chemo).   OT comments  Pt progressing towards established OT goals. Pt continues to present with decreased activity tolerance as seen by fatigue and decreased SpO2 with minimal movement. Pt performing grooming tasks at sink with set up and supervision; while brushing his hair, pt SpO2 dropping into the 80s on 2L and required rest break and purse lip breathing. Pt requiring Min A for functional mobility with rollator. Update dc recommendation to SNF and will continue to follow acutely as admitted.    Follow Up Recommendations  SNF;Supervision/Assistance - 24 hour    Equipment Recommendations       Recommendations for Other Services      Precautions / Restrictions Precautions Precautions: Fall Restrictions Weight Bearing Restrictions: No       Mobility Bed Mobility Overal bed mobility: Needs Assistance Bed Mobility: Rolling;Sidelying to Sit;Sit to Sidelying Rolling: Min guard Sidelying to sit: Min guard       General bed mobility comments: vc for technique; +rail  Transfers Overall transfer level: Needs assistance Equipment used: 4-wheeled walker Transfers: Sit to/from Stand Sit to Stand: Min assist         General transfer comment: vc for locking/unlocking rollator; assist to stabilize rollator and to power up and steady    Balance Overall  balance assessment: Needs assistance Sitting-balance support: No upper extremity supported;Feet supported Sitting balance-Leahy Scale: Fair Sitting balance - Comments: incr pain with seated brushing teeth as he would forward bend to spit into sink   Standing balance support: Bilateral upper extremity supported Standing balance-Leahy Scale: Poor Standing balance comment: posterior lean when not holding onto rollator                           ADL either performed or assessed with clinical judgement   ADL Overall ADL's : Needs assistance/impaired     Grooming: Set up;Supervision/safety;Sitting;Oral care;Brushing hair Grooming Details (indicate cue type and reason): Supervision for safety. Performing oral care at sink with set up. Pt brushing his hair and demonstrating poor activity tolerance with fatigue and decreased SpO2.                  Toilet Transfer: Minimal assistance;Ambulation(simulated in room) Toilet Transfer Details (indicate cue type and reason): Min A for safety and balance         Functional mobility during ADLs: Minimal assistance(rollator) General ADL Comments: Pt demonstrating decreased activity tolerance with decreased SpO2 and fatigue     Vision       Perception     Praxis      Cognition Arousal/Alertness: Awake/alert Behavior During Therapy: WFL for tasks assessed/performed Overall Cognitive Status: Within Functional Limits for tasks assessed  Exercises     Shoulder Instructions       General Comments On 2L with sats 90-94% during session (periods where pleth not registering when moving his hand)    Pertinent Vitals/ Pain       Pain Assessment: Faces Faces Pain Scale: Hurts even more Pain Location: abdomen; low back Pain Descriptors / Indicators: Sharp(especially when he coughs) Pain Intervention(s): Monitored during session;Limited activity within patient's  tolerance;Repositioned  Home Living                                          Prior Functioning/Environment              Frequency  Min 2X/week        Progress Toward Goals  OT Goals(current goals can now be found in the care plan section)     Acute Rehab OT Goals Patient Stated Goal: decrease abd pain; find out why his left lower back hurts ADL Goals Pt Will Perform Grooming: with modified independence Pt Will Perform Upper Body Bathing: with set-up Pt Will Perform Lower Body Bathing: with supervision Pt Will Perform Upper Body Dressing: with set-up Pt Will Perform Lower Body Dressing: with supervision Pt Will Transfer to Toilet: with supervision Pt/caregiver will Perform Home Exercise Program: Increased strength;With Supervision  Plan Discharge plan remains appropriate    Co-evaluation    PT/OT/SLP Co-Evaluation/Treatment: Yes Reason for Co-Treatment: For patient/therapist safety;To address functional/ADL transfers   OT goals addressed during session: ADL's and self-care      AM-PAC OT "6 Clicks" Daily Activity     Outcome Measure                    End of Session Equipment Utilized During Treatment: Rolling walker;Oxygen  OT Visit Diagnosis: Unsteadiness on feet (R26.81);Muscle weakness (generalized) (M62.81);Other (comment)(decreased activity tolerance)   Activity Tolerance Patient limited by fatigue   Patient Left in chair;with call bell/phone within reach;with chair alarm set   Nurse Communication Mobility status        Time: 8832-5498 OT Time Calculation (min): 40 min  Charges: OT General Charges $OT Visit: 1 Visit OT Treatments $Self Care/Home Management : 23-37 mins  [place], OTR/L Acute Rehab Pager: [phone] Office: [place] 01/09/2020, 4:47 PM

## 2020-01-10 ENCOUNTER — Other Ambulatory Visit: Payer: Medicare Other

## 2020-01-10 ENCOUNTER — Ambulatory Visit: Payer: Medicare Other | Admitting: Physician Assistant

## 2020-01-10 ENCOUNTER — Ambulatory Visit: Payer: Medicare Other

## 2020-01-10 DIAGNOSIS — E222 Syndrome of inappropriate secretion of antidiuretic hormone: Secondary | ICD-10-CM | POA: Diagnosis not present

## 2020-01-10 DIAGNOSIS — C3490 Malignant neoplasm of unspecified part of unspecified bronchus or lung: Secondary | ICD-10-CM | POA: Diagnosis not present

## 2020-01-10 DIAGNOSIS — I480 Paroxysmal atrial fibrillation: Secondary | ICD-10-CM | POA: Diagnosis not present

## 2020-01-10 DIAGNOSIS — I4819 Other persistent atrial fibrillation: Secondary | ICD-10-CM | POA: Diagnosis not present

## 2020-01-10 DIAGNOSIS — N400 Enlarged prostate without lower urinary tract symptoms: Secondary | ICD-10-CM | POA: Diagnosis not present

## 2020-01-10 DIAGNOSIS — I959 Hypotension, unspecified: Secondary | ICD-10-CM | POA: Diagnosis not present

## 2020-01-10 DIAGNOSIS — K5792 Diverticulitis of intestine, part unspecified, without perforation or abscess without bleeding: Secondary | ICD-10-CM | POA: Diagnosis not present

## 2020-01-10 DIAGNOSIS — R2232 Localized swelling, mass and lump, left upper limb: Secondary | ICD-10-CM | POA: Diagnosis not present

## 2020-01-10 DIAGNOSIS — I10 Essential (primary) hypertension: Secondary | ICD-10-CM | POA: Diagnosis not present

## 2020-01-10 DIAGNOSIS — M7989 Other specified soft tissue disorders: Secondary | ICD-10-CM | POA: Diagnosis not present

## 2020-01-10 DIAGNOSIS — K5720 Diverticulitis of large intestine with perforation and abscess without bleeding: Secondary | ICD-10-CM | POA: Diagnosis not present

## 2020-01-10 DIAGNOSIS — E039 Hypothyroidism, unspecified: Secondary | ICD-10-CM | POA: Diagnosis not present

## 2020-01-10 DIAGNOSIS — Z9889 Other specified postprocedural states: Secondary | ICD-10-CM | POA: Diagnosis not present

## 2020-01-10 DIAGNOSIS — R0902 Hypoxemia: Secondary | ICD-10-CM | POA: Diagnosis not present

## 2020-01-10 DIAGNOSIS — T8189XD Other complications of procedures, not elsewhere classified, subsequent encounter: Secondary | ICD-10-CM | POA: Diagnosis not present

## 2020-01-10 DIAGNOSIS — Z7401 Bed confinement status: Secondary | ICD-10-CM | POA: Diagnosis not present

## 2020-01-10 DIAGNOSIS — U071 COVID-19: Secondary | ICD-10-CM | POA: Diagnosis not present

## 2020-01-10 DIAGNOSIS — I313 Pericardial effusion (noninflammatory): Secondary | ICD-10-CM | POA: Diagnosis not present

## 2020-01-10 DIAGNOSIS — F339 Major depressive disorder, recurrent, unspecified: Secondary | ICD-10-CM | POA: Diagnosis not present

## 2020-01-10 DIAGNOSIS — E871 Hypo-osmolality and hyponatremia: Secondary | ICD-10-CM | POA: Diagnosis not present

## 2020-01-10 DIAGNOSIS — M2550 Pain in unspecified joint: Secondary | ICD-10-CM | POA: Diagnosis not present

## 2020-01-10 DIAGNOSIS — Z79899 Other long term (current) drug therapy: Secondary | ICD-10-CM | POA: Diagnosis not present

## 2020-01-10 DIAGNOSIS — Z4801 Encounter for change or removal of surgical wound dressing: Secondary | ICD-10-CM | POA: Diagnosis not present

## 2020-01-10 DIAGNOSIS — F419 Anxiety disorder, unspecified: Secondary | ICD-10-CM | POA: Diagnosis not present

## 2020-01-10 DIAGNOSIS — Z48815 Encounter for surgical aftercare following surgery on the digestive system: Secondary | ICD-10-CM | POA: Diagnosis not present

## 2020-01-10 DIAGNOSIS — Z8616 Personal history of COVID-19: Secondary | ICD-10-CM | POA: Diagnosis not present

## 2020-01-10 DIAGNOSIS — Z933 Colostomy status: Secondary | ICD-10-CM | POA: Diagnosis not present

## 2020-01-10 DIAGNOSIS — J189 Pneumonia, unspecified organism: Secondary | ICD-10-CM | POA: Diagnosis not present

## 2020-01-10 DIAGNOSIS — Z7901 Long term (current) use of anticoagulants: Secondary | ICD-10-CM | POA: Diagnosis not present

## 2020-01-10 DIAGNOSIS — R600 Localized edema: Secondary | ICD-10-CM | POA: Diagnosis not present

## 2020-01-10 MED ORDER — HYDROCODONE-ACETAMINOPHEN 5-325 MG PO TABS: 1.0000 | ORAL_TABLET | Freq: Three times a day (TID) | ORAL | 0 refills | Status: DC | PRN

## 2020-01-10 MED ORDER — POLYETHYLENE GLYCOL 3350 17 G PO PACK
17.0000 g | PACK | Freq: Every day | ORAL | Status: DC
  Administered 2020-01-10: 17 g via ORAL
  Filled 2020-01-10: qty 1

## 2020-01-10 MED ORDER — FINASTERIDE 5 MG PO TABS: 5.0000 mg | ORAL_TABLET | Freq: Every day | ORAL | Status: DC

## 2020-01-10 MED ORDER — ALPRAZOLAM 0.5 MG PO TABS: 0.5000 mg | ORAL_TABLET | Freq: Every evening | ORAL | 0 refills | Status: DC | PRN

## 2020-01-10 MED ORDER — HYDROMORPHONE HCL 1 MG/ML IJ SOLN: 0.5000 mg | INTRAMUSCULAR | Status: DC | PRN

## 2020-01-10 MED ORDER — TAMSULOSIN HCL 0.4 MG PO CAPS: 0.4000 mg | ORAL_CAPSULE | Freq: Two times a day (BID) | ORAL | Status: DC

## 2020-01-10 MED ORDER — FINASTERIDE 5 MG PO TABS
5.0000 mg | ORAL_TABLET | Freq: Every day | ORAL | Status: DC
  Administered 2020-01-10: 5 mg via ORAL
  Filled 2020-01-10: qty 1

## 2020-01-10 NOTE — TOC Progression Note (Addendum)
Transition of Care (TOC) - Progression Note    Patient Details  Name: Telford Archambeau. MRN: [id number] Date of Birth: 1940/12/19  Transition of Care [facility]) CM/SW [place], [place] Phone Number: 01/10/2020, 11:33 AM  Clinical Narrative:     Update: Miquel Dunn made contact with CSW and stated they have bed availability. CSW contacted patient's daughter [name] and informed her of the availability. She would like patient to discharge to [facility].   CSW spoke with patient's daughter [name] [phone]. CSW informed patient's daughter there is no bed availability at [facility] as of today. Informed [facility] and [facility] have bed availability. Patient's daughter is reluctant to send patient to SNF, now considering home health. CSW verbally provided information on how Home Health works and frequency of visits. Patient's daughter will follow-up with CSW on final decision.     Expected Discharge Plan: Skilled Nursing Facility Barriers to Discharge: Continued Medical Work up  Expected Discharge Plan and Services Expected Discharge Plan: [place]                                               Social Determinants of Health (SDOH) Interventions    Readmission Risk Interventions Readmission Risk Prevention Plan 01/06/2020  Transportation Screening Complete  Medication Review Press photographer) Complete  Some recent data might be hidden

## 2020-01-10 NOTE — Discharge Summary (Addendum)
PATIENT DETAILS Name: Anthony Curry. Age: 79 y.o. Sex: male Date of Birth: 03/15/1941 MRN: [id number]. Admitting Physician: Shelly Coss, MD TIW:[email or web address], [name], MD  Admit Date: 01/02/2020 Discharge date: 01/10/2020  Recommendations for Outpatient Follow-up:  1. Follow up with PCP in 1-2 weeks 2. Please obtain CMP/CBC in one week 3. Repeat Chest Xray in 4-6 week 4. Please follow up with general surgery, oncology and cardiology (for repeat echo) and urology (for a voiding trial-being discharged with Foley catheter)  Admitted From:  Home  Disposition: SNF   Home Health: No  Equipment/Devices: Foley catheter: 2/5  Discharge Condition: Stable  CODE STATUS: FULL CODE  Diet recommendation:  Diet Order            Diet - low sodium heart healthy        DIET SOFT Room service appropriate? Yes; Fluid consistency: Thin  Diet effective now               Brief Summary: See H&P, Labs, Consult and Test reports for all details in brief, Patient is a 79 y.o. 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  Subsequent hospital course was complicated by ischemic left hand-patient underwent left brachial embolectomy on 2/1.  See below for further details  Brief Hospital Course: Acute diverticulitis with perforation-s/p exploratory laparotomy with sigmoid resection and colostomy creation on 1/31: General surgery followed closely-tolerating advancement in diet-now on soft diet. JP drain discontinued on 2/5. Per general surgery-does not require Zosyn any longer. Ostomy care per protocol-BID WD dressing changes to midline wound  Antibiotics: Zosyn:  1/28>>2/5  Ischemia to left hand-s/p left subclavian axillary brachial radial ulnar embolectomy on 2/1: Vascular surgery has signed off-was on IV heparin but has been transitioned to Eliquis.   Recent echo on 01/02/2020 with preserved EF without any embolic source-but patient does have a history of PAF.  Acute Hypoxic Resp Failure due to Covid 19 Viral pneumonia: Improved-down to just 1-2 L of oxygen. Completed remdesivir on 2/4. CRP still elevated but trending down-suspect CRP elevation more from surgical issues rather than pneumonia.   COVID-19 Labs:  Recent Labs    01/08/20 0505 01/09/20 0330  DDIMER 2.31* 2.35*  FERRITIN 377* 358*  CRP 10.6* 9.1*    Lab Results  Component Value Date   SARSCOV2NAA POSITIVE (A) 01/02/2020   SARSCOV2NAA Detected (A) 01/01/2020   [place] Not Detected 12/27/2019   [place] Not Detected 11/25/2019    COVID-19 Medications: Remdesivir: 1/31>> 2/4 Convalescent Plasma: 1/31 x 1  Pericardial effusion: Previously seen by cardiology with recommendations for outpatient follow-up-repeat echocardiogram on 2/3-with no tamponade physiology.  Etiology of effusion is likely from underlying lung cancer-and I suspect is now stable to be followed by oncology/PCP/cardiology in the outpatient setting.  PAF: Rate controlled-continue Cardizem-was on IV heparin-since repeat echo on 2/3 does not show any tamponade physiology-transition to Eliquis.  Acute urinary retention: Had Foley catheter in place-this was discontinued on 2/4-unfortunately he has not voided-and has redeveloped acute urinary retention-Foley catheter to be reinserted. Continue Flomax-add finasteride. Patient will need outpatient voiding trial--follow with urology.  Hematuria: Resolved  History of SIADH: Likely secondary to underlying lung cancer-sodium level stable-on demeclocycline  Hypokalemia: Repleted.  Hypothyroidism: Continue Synthroid-  History of non-small cell cancer of the  lung: Follows with Dr. Earlie Server  Deconditioning/debility: Secondary to acute illness-evaluated by PT services-recommendations are for SNF.   Nutrition Problem: Nutrition Problem: Increased nutrient needs Etiology: acute illness, catabolic DGUYQIH(KVQQV-95 infection) Signs/Symptoms: estimated needs Interventions: Refer to RD note for recommendations  Procedures/Studies: See above  Discharge Diagnoses:  Principal Problem:   Diverticulitis Active Problems:   Hyponatremia   Atrial fibrillation (HCC)   Hypertension   SIADH (syndrome of inappropriate ADH production) ([place])   Acute diverticulitis   Discharge Instructions:    Person Under Monitoring Name: Anthony Curry.  Location: [address] [place] [address]   Infection Prevention Recommendations for Individuals Confirmed to have, or Being Evaluated for, 2019 Novel Coronavirus (COVID-19) Infection Who Receive Care at Home  Individuals who are confirmed to have, or are being evaluated for, COVID-19 should follow the prevention steps below until a healthcare provider or local or state health department says they can return to normal activities.  Stay home except to get medical care You should restrict activities outside your home, except for getting medical care. Do not go to work, school, or public areas, and do not use public transportation or taxis.  Call ahead before visiting your doctor Before your medical appointment, call the healthcare provider and tell them that you have, or are being evaluated for, COVID-19 infection. This will help the healthcare provider's office take steps to keep other people from getting infected. Ask your healthcare provider to call the local or state health department.  Monitor your symptoms Seek prompt medical attention if your illness is worsening (e.g., difficulty breathing). Before going to your medical appointment, call the healthcare provider and tell them that you  have, or are being evaluated for, COVID-19 infection. Ask your healthcare provider to call the local or state health department.  Wear a facemask You should wear a facemask that covers your nose and mouth when you are in the same room with other people and when you visit a healthcare provider. People who live with or visit you should also wear a facemask while they are in the same room with you.  Separate yourself from other people in your home As much as possible, you should stay in a different room from other people in your home. Also, you should use a separate bathroom, if available.  Avoid sharing household items You should not share dishes, drinking glasses, cups, eating utensils, towels, bedding, or other items with other people in your home. After using these items, you should wash them thoroughly with soap and water.  Cover your coughs and sneezes Cover your mouth and nose with a tissue when you cough or sneeze, or you can cough or sneeze into your sleeve. Throw used tissues in a lined trash can, and immediately wash your hands with soap and water for at least 20 seconds or use an alcohol-based hand rub.  Wash your [facility] your hands often and thoroughly with soap and water for at least 20 seconds. You can use an alcohol-based hand sanitizer if soap and water are not available and if your hands are not visibly dirty. Avoid touching your eyes, nose, and mouth with unwashed hands.   Prevention Steps for Caregivers and Household Members of Individuals Confirmed to have, or Being Evaluated for, COVID-19 Infection Being Cared for in the Home  If you live with, or provide care at home for, a person confirmed to have, or being evaluated for, COVID-19 infection please follow these guidelines to prevent  infection:  Follow healthcare provider's instructions Make sure that you understand and can help the patient follow any healthcare provider instructions for all care.  Provide  for the patient's basic needs You should help the patient with basic needs in the home and provide support for getting groceries, prescriptions, and other personal needs.  Monitor the patient's symptoms If they are getting sicker, call his or her medical provider and tell them that the patient has, or is being evaluated for, COVID-19 infection. This will help the healthcare provider's office take steps to keep other people from getting infected. Ask the healthcare provider to call the local or state health department.  Limit the number of people who have contact with the patient  If possible, have only one caregiver for the patient.  Other household members should stay in another home or place of residence. If this is not possible, they should stay  in another room, or be separated from the patient as much as possible. Use a separate bathroom, if available.  Restrict visitors who do not have an essential need to be in the home.  Keep older adults, very young children, and other sick people away from the patient Keep older adults, very young children, and those who have compromised immune systems or chronic health conditions away from the patient. This includes people with chronic heart, lung, or kidney conditions, diabetes, and cancer.  Ensure good ventilation Make sure that shared spaces in the home have good air flow, such as from an air conditioner or an opened window, weather permitting.  Wash your hands often  Wash your hands often and thoroughly with soap and water for at least 20 seconds. You can use an alcohol based hand sanitizer if soap and water are not available and if your hands are not visibly dirty.  Avoid touching your eyes, nose, and mouth with unwashed hands.  Use disposable paper towels to dry your hands. If not available, use dedicated cloth towels and replace them when they become wet.  Wear a facemask and gloves  Wear a disposable facemask at all times in the  room and gloves when you touch or have contact with the patient's blood, body fluids, and/or secretions or excretions, such as sweat, saliva, sputum, nasal mucus, vomit, urine, or feces.  Ensure the mask fits over your nose and mouth tightly, and do not touch it during use.  Throw out disposable facemasks and gloves after using them. Do not reuse.  Wash your hands immediately after removing your facemask and gloves.  If your personal clothing becomes contaminated, carefully remove clothing and launder. Wash your hands after handling contaminated clothing.  Place all used disposable facemasks, gloves, and other waste in a lined container before disposing them with other household waste.  Remove gloves and wash your hands immediately after handling these items.  Do not share dishes, glasses, or other household items with the patient  Avoid sharing household items. You should not share dishes, drinking glasses, cups, eating utensils, towels, bedding, or other items with a patient who is confirmed to have, or being evaluated for, COVID-19 infection.  After the person uses these items, you should wash them thoroughly with soap and water.  Wash laundry thoroughly  Immediately remove and wash clothes or bedding that have blood, body fluids, and/or secretions or excretions, such as sweat, saliva, sputum, nasal mucus, vomit, urine, or feces, on them.  Wear gloves when handling laundry from the patient.  Read and follow directions on labels of  laundry or clothing items and detergent. In general, wash and dry with the warmest temperatures recommended on the label.  Clean all areas the individual has used often  Clean all touchable surfaces, such as counters, tabletops, doorknobs, bathroom fixtures, toilets, phones, keyboards, tablets, and bedside tables, every day. Also, clean any surfaces that may have blood, body fluids, and/or secretions or excretions on them.  Wear gloves when cleaning  surfaces the patient has come in contact with.  Use a diluted bleach solution (e.g., dilute bleach with 1 part bleach and 10 parts water) or a household disinfectant with a label that says EPA-registered for coronaviruses. To make a bleach solution at home, add 1 tablespoon of bleach to 1 quart (4 cups) of water. For a larger supply, add  cup of bleach to 1 gallon (16 cups) of water.  Read labels of cleaning products and follow recommendations provided on product labels. Labels contain instructions for safe and effective use of the cleaning product including precautions you should take when applying the product, such as wearing gloves or eye protection and making sure you have good ventilation during use of the product.  Remove gloves and wash hands immediately after cleaning.  Monitor yourself for signs and symptoms of illness Caregivers and household members are considered close contacts, should monitor their health, and will be asked to limit movement outside of the home to the extent possible. Follow the monitoring steps for close contacts listed on the symptom monitoring form.   ? If you have additional questions, contact your local health department or call the epidemiologist on call at [phone] (available 24/7). ? This guidance is subject to change. For the most up-to-date guidance from CDC, please refer to their website: YouBlogs.pl    Activity:  As tolerated with Full fall precautions use walker/cane & assistance as needed   Discharge Instructions    Call MD for:  redness, tenderness, or signs of infection (pain, swelling, redness, odor or green/yellow discharge around incision site)   Complete by: As directed    Diet - low sodium heart healthy   Complete by: As directed    Discharge instructions   Complete by: As directed    3 weeks of isolation from 01/01/2020   Increase activity slowly   Complete by: As  directed      Allergies as of 01/10/2020   No Known Allergies     Medication List    STOP taking these medications   doxycycline 100 MG capsule Commonly known as: VIBRAMYCIN     TAKE these medications   Acidophilus Caps capsule Take 1 capsule by mouth daily.   ALPRAZolam 0.5 MG tablet Commonly known as: XANAX Take 0.5 mg by mouth at bedtime as needed.   calcium carbonate 600 MG Tabs tablet Commonly known as: OS-CAL Take 600 mg by mouth daily with breakfast.   demeclocycline 150 MG tablet Commonly known as: DECLOMYCIN Take 2 tablets twice a day What changed:   how much to take  how to take this  when to take this  additional instructions   diltiazem 180 MG 24 hr capsule Commonly known as: CARDIZEM CD Take 1 capsule (180 mg total) by mouth daily at 12 noon.   Eliquis 5 MG Tabs tablet Generic drug: apixaban Take 5 mg by mouth 2 (two) times daily.   ENSURE COMPLETE PO Take 1 Can by mouth 2 (two) times daily.   finasteride 5 MG tablet Commonly known as: PROSCAR Take 1 tablet (5 mg total) by  mouth daily.   FISH OIL PO Take 1 tablet by mouth daily.   fluticasone 50 MCG/ACT nasal spray Commonly known as: FLONASE Place 2 sprays into both nostrils at bedtime.   HYDROcodone-acetaminophen 5-325 MG tablet Commonly known as: NORCO/VICODIN Take 1 tablet by mouth 3 (three) times daily as needed for moderate pain.   Klor-Con M20 20 MEQ tablet Generic drug: potassium chloride SA Take 20 mEq by mouth every morning.   levothyroxine 50 MCG tablet Commonly known as: SYNTHROID TAKE 1 TABLET (50 MCG TOTAL) BY MOUTH DAILY BEFORE BREAKFAST.   MAGNESIUM PO Take 400 mg by mouth daily.   mirtazapine 30 MG tablet Commonly known as: REMERON TAKE 1 TABLET(30 MG) BY MOUTH AT BEDTIME What changed: See the new instructions.   multivitamin with minerals Tabs tablet Take 1 tablet by mouth daily.   OPDIVO IV Inject as directed every 30 (thirty) days. Inject as directed  every 2 weeks   SUPER B COMPLEX PO Take 1 each by mouth daily.   tamsulosin 0.4 MG Caps capsule Commonly known as: FLOMAX Take 1 capsule (0.4 mg total) by mouth 2 (two) times daily. What changed: when to take this   thiamine 100 MG tablet Commonly known as: VITAMIN B-1 Take 1 tablet (100 mg total) by mouth daily.   VITAMIN C PO Take 1,000 mg by mouth daily.   vitamin E 180 MG (400 UNITS) capsule Take 400 Units by mouth daily.      Follow-up Information    [name], MD. Schedule an appointment as soon as possible for a visit in 2 week(s).   Specialty: Family Medicine Contact information: [place] [address] [phone]        [name], MD. Schedule an appointment as soon as possible for a visit in 2 week(s).   Specialty: Oncology Contact information: [place] [address] [phone]        Serafina Mitchell, MD. Schedule an appointment as soon as possible for a visit in 2 day(s).   Specialties: Vascular Surgery, Cardiology Contact information: [place] [address] [phone]        Geralynn Rile, MD. Schedule an appointment as soon as possible for a visit in 2 week(s).   Specialties: Internal Medicine, Cardiology, Radiology Contact information: [place] [address] [phone]        Kinsinger, Arta Bruce, MD. Schedule an appointment as soon as possible for a visit in 2 week(s).   Specialty: General Surgery Contact information: [place] [address] [phone]          No Known Allergies   Consultations:   general surgery   Cards   Other Procedures/Studies: DG Chest 2 View  Result Date: 01/05/2020 CLINICAL DATA:  Patient with stage IV lung cancer. Left lower quadrant abdominal pain. Diarrhea. EXAM: CHEST - 2 VIEW COMPARISON:  CT scan from January 05, 2020 at 11:39 a.m. FINDINGS: Free air seen  under the diaphragm, also appreciated on the CT scan from earlier today. Healed right rib fractures. Mild infiltrate in the medial right lung base, more prominent since January 02, 2020. Pleural thickening versus small left pleural effusion. Atelectasis or scar in the left base. The heart, hila, mediastinum, lungs, and pleura are otherwise unchanged. IMPRESSION: 1. Free air under the diaphragm. This finding was seen on the CT scan from earlier today and called to Dr. Tana Coast by Dr. Jobe Igo at 12:30  p.m. 2. Mild opacity in the right base is likely atelectasis based on CT imaging from earlier today. 3. No other changes. Electronically Signed   By: Dorise Bullion III M.D   On: 01/05/2020 12:43   CT ABDOMEN PELVIS W CONTRAST  Result Date: 01/05/2020 CLINICAL DATA:  Nausea and vomiting. Acute diverticulitis. Worsening pain. Fevers. EXAM: CT ABDOMEN AND PELVIS WITH CONTRAST TECHNIQUE: Multidetector CT imaging of the abdomen and pelvis was performed using the standard protocol following bolus administration of intravenous contrast. CONTRAST:  161mL OMNIPAQUE IOHEXOL 300 MG/ML  SOLN COMPARISON:  01/02/2020 FINDINGS: Lower chest: Bibasilar atelectasis. Normal heart size with small chronic pericardial effusion. Small bilateral pleural effusions, new. Multivessel coronary artery atherosclerosis. Hepatobiliary: Scattered well-circumscribed low-density liver lesions are likely cysts. Normal gallbladder, without biliary ductal dilatation. Pancreas: Normal, without mass or ductal dilatation. Spleen: Normal in size, without focal abnormality. Adrenals/Urinary Tract: Normal left adrenal gland. Mild right adrenal thickening. Right renal cortical thinning. No hydronephrosis. Decompressed urinary bladder around a Foley catheter. Stomach/Bowel: Normal stomach, without wall thickening. Scattered colonic diverticula. Increased wall thickening involving the proximal sigmoid, including on 67/2. Mid small bowel loops measure up to 2.4 cm.  Pelvic small bowel loops are mildly thickened, including on 73/2. Vascular/Lymphatic: Advanced aortic and branch vessel atherosclerosis. No abdominopelvic adenopathy. Reproductive: Normal prostate. Other: New small volume cul-de-sac fluid, including on 71/2. Extensive, moderate volume free intraperitoneal air, including within the right upper quadrant on 27/2 and adjacent the right lobe of the liver on 29/2. Multiple small locules of pelvic free air, including on 61/2. Enhancing small volume fluid within the anterior left pelvis on 66/2 and up to 3.8 x 1.5 cm just inferior to this on 71/2. Musculoskeletal: Bilateral remote rib fractures. Lumbosacral spondylosis. IMPRESSION: 1. Moderate free intraperitoneal air, likely due to complicated sigmoid diverticulitis. Small volume pericolonic fluid and enhancement, suspicious for infected ascites. 2. Borderline small bowel dilatation, suggesting adynamic ileus. Small bowel wall thickening within the pelvis is likely due to secondary enteritis. 3. New small bilateral pleural effusions with similar pericardial effusion. 4. Coronary artery atherosclerosis. Aortic Atherosclerosis (ICD10-I70.0). Critical test results telephoned to Dr. Tana Coast. At the time of interpretation at 12:30 p.m.On 01/05/2020. Electronically Signed   By: Abigail Miyamoto M.D.   On: 01/05/2020 12:32   CT ABDOMEN PELVIS W CONTRAST  Result Date: 01/02/2020 CLINICAL DATA:  Lower abdominal pain. The patient is on immunotherapy for lung carcinoma. EXAM: CT ABDOMEN AND PELVIS WITH CONTRAST TECHNIQUE: Multidetector CT imaging of the abdomen and pelvis was performed using the standard protocol following bolus administration of intravenous contrast. CONTRAST:  100 mL OMNIPAQUE IOHEXOL 300 MG/ML  SOLN COMPARISON:  CT chest, abdomen and pelvis 05/13/2019. FINDINGS: Lower chest: No pleural effusion. Small pericardial effusion is unchanged. Lung bases are emphysematous. There is mild dependent atelectasis. Hepatobiliary: A  few small low attenuating lesions in the liver likely representing cysts are unchanged. Status post cholecystectomy. Biliary tree unremarkable. Pancreas: Unremarkable. No pancreatic ductal dilatation or surrounding inflammatory changes. Spleen: Normal in size without focal abnormality. 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 The left adrenal gland appears normal. There is some scarring in the right kidney which is unchanged. The kidneys otherwise appear normal. No stones or hydronephrosis. The urinary bladder is distended but otherwise unremarkable. Stomach/Bowel: Diverticulosis is identified. There is thickening and stranding near the junction of the descending and sigmoid consistent with acute diverticulitis. No abscess or perforation. The stomach and small bowel appear normal. The appendix is normal in appearance. Note  is made that the cecum is positioned in the right upper quadrant. Vascular/Lymphatic: Aortic atherosclerosis. No enlarged abdominal or pelvic lymph nodes. Reproductive: Prostate is unremarkable. Other: None. Musculoskeletal: No acute or focal abnormality. Severe degenerative change throughout the visualized spine is noted. IMPRESSION: Diverticulosis with acute diverticulitis the junction of the distal descending and sigmoid colon. No abscess or perforation. No change in a moderate pericardial effusion. Emphysema. Extensive atherosclerosis. Electronically Signed   By: Inge Rise M.D.   On: 01/02/2020 13:10   DG CHEST PORT 1 VIEW  Result Date: 01/02/2020 CLINICAL DATA:  79 year old male with stage IV lung cancer, SI ADH. Undergoing chemotherapy. Positive COVID-19 yesterday. EXAM: PORTABLE CHEST 1 VIEW COMPARISON:  CT Abdomen and Pelvis earlier today. Chest CT 09/02/2019 and earlier. FINDINGS: Portable AP semi upright view at 1741 hours. Stable lung volumes and mediastinal contours since 2020. Numerous chronic right rib fractures. Chronic reticular opacity in the  right upper lobe and near the left hilum. Elsewhere when allowing for portable technique the lungs are clear. No pneumothorax, pulmonary edema or pleural effusion. Stable visualized osseous structures. IMPRESSION: Stable radiographic appearance of chronic lung disease since July 2020. No acute cardiopulmonary abnormality. Electronically Signed   By: Genevie Ann M.D.   On: 01/02/2020 18:38   VAS [place] UPPER EXTREMITY ARTERIAL DUPLEX  Result Date: 01/06/2020 UPPER EXTREMITY DUPLEX STUDY Indications: Patient complains of cool left upper extremity with pallor. History:     Patient has a history ofrecent left upper extremity arterial line.  Comparison Study: No prior study. Performing Technologist: Maudry Mayhew MHA, RDMS, RVT, RDCS  Examination Guidelines: A complete evaluation includes B-mode imaging, spectral Doppler, color Doppler, and power Doppler as needed of all accessible portions of each vessel. Bilateral testing is considered an integral part of a complete examination. Limited examinations for reoccurring indications may be performed as noted.  Right Doppler Findings: +---------------+----------+--------+--------+--------+ Site           PSV (cm/s)WaveformStenosisComments +---------------+----------+--------+--------+--------+ Subclavian Prox134       biphasic                 +---------------+----------+--------+--------+--------+  Left Doppler Findings: +---------------+----------+--------------+--------+--------+ Site           PSV (cm/s)Waveform      StenosisComments +---------------+----------+--------------+--------+--------+ Subclavian Prox17        monophasic                     +---------------+----------+--------------+--------+--------+ Subclavian Mid 12        monophasic                     +---------------+----------+--------------+--------+--------+ Axillary       15.00     monophasic                      +---------------+----------+--------------+--------+--------+ Brachial Prox  14        monophasic                     +---------------+----------+--------------+--------+--------+ Brachial Dist  11        monophasic                     +---------------+----------+--------------+--------+--------+ Radial Prox    4         barely audible                 +---------------+----------+--------------+--------+--------+ Radial Dist  barely audible                 +---------------+----------+--------------+--------+--------+ Ulnar Dist     10        barely audible                 +---------------+----------+--------------+--------+--------+ Palmar Arch    6         barely audible                 +---------------+----------+--------------+--------+--------+   Summary:  Right: No obstruction visualized in the right subclavian artery. Left: Monophasic flow at the proximal left subclavian artery is       suggestive of proximal obstruction. Radial, ulnar, and palmar       arch signals are patent, however signals are barely       detectable. *See table(s) above for measurements and observations. Electronically signed by Ruta Hinds MD on 01/06/2020 at 5:06:56 PM.    Final    ECHOCARDIOGRAM COMPLETE  Result Date: 01/02/2020   ECHOCARDIOGRAM REPORT   Patient Name:   Eloise Mula. Date of Exam: 01/02/2020 Medical Rec #:  [id number]           Height:       71.0 in Accession #:    [id number]          Weight:       145.0 lb Date of Birth:  03/02/1941          BSA:          1.84 m Patient Age:    18 years            BP:           152/65 mmHg Patient Gender: M                   HR:           82 bpm. Exam Location:  Inpatient Procedure: 2D Echo, Cardiac Doppler and Color Doppler Indications:    Pericardial effusion 423.9  History:        Patient has prior history of Echocardiogram examinations, most                 recent 07/26/2014. Arrythmias:Atrial Fibrillation and Atrial                  Flutter; Risk Factors:Hypertension and Former Smoker. GERD.  Sonographer:    Paulita Fujita RDCS Referring Phys: [id number] AMRIT ADHIKARI  Sonographer Comments: Image acquisition challenging due to patient body habitus. IMPRESSIONS  1. Left ventricular ejection fraction, by visual estimation, is 60 to 65%. The left ventricle has normal function. There is no left ventricular hypertrophy.  2. Left ventricular diastolic parameters are consistent with Grade I diastolic dysfunction (impaired relaxation).  3. Global right ventricle has normal systolic function.The right ventricular size is normal. No increase in right ventricular wall thickness.  4. Left atrial size was normal.  5. Right atrial size was normal.  6. Large pericardial effusion.  7. The pericardial effusion is anterior to the right ventricle.  8. There is inversion of the right ventricular wall and excessive respiratory variation in the tricuspid valve spectral Doppler velocities.  9. The mitral valve is normal in structure. Mild mitral valve regurgitation. No evidence of mitral stenosis. 10. The tricuspid valve is normal in structure. 11. The tricuspid valve is normal in structure. Tricuspid valve regurgitation is mild. 12. The aortic valve is normal in structure. Aortic valve regurgitation  is not visualized. No evidence of aortic valve sclerosis or stenosis. 13. The pulmonic valve was normal in structure. Pulmonic valve regurgitation is not visualized. 14. Mildly elevated pulmonary artery systolic pressure. 15. The inferior vena cava is normal in size with greater than 50% respiratory variability, suggesting right atrial pressure of 3 mmHg. FINDINGS  Left Ventricle: Left ventricular ejection fraction, by visual estimation, is 60 to 65%. The left ventricle has normal function. The left ventricle is not well visualized. There is no left ventricular hypertrophy. Left ventricular diastolic parameters are consistent with Grade I diastolic dysfunction  (impaired relaxation). Indeterminate filling pressures. Right Ventricle: The right ventricular size is normal. No increase in right ventricular wall thickness. Global RV systolic function is has normal systolic function. The tricuspid regurgitant velocity is 2.46 m/s, and with an assumed right atrial pressure  of 8 mmHg, the estimated right ventricular systolic pressure is mildly elevated at 32.2 mmHg. Left Atrium: Left atrial size was normal in size. Right Atrium: Right atrial size was normal in size Pericardium: A large pericardial effusion is present. The pericardial effusion is anterior to the right ventricle. There is inversion of the right ventricular wall and excessive respiratory variation in the tricuspid valve spectral Doppler velocities. Pericardial effusion measuring up to 1.86 cm. Unable to visualize the IVC. Findings may be con. Mitral Valve: The mitral valve is normal in structure. Mild mitral valve regurgitation. No evidence of mitral valve stenosis by observation. Tricuspid Valve: The tricuspid valve is normal in structure. Tricuspid valve regurgitation is mild. Aortic Valve: The aortic valve is normal in structure. Aortic valve regurgitation is not visualized. The aortic valve is structurally normal, with no evidence of sclerosis or stenosis. Pulmonic Valve: The pulmonic valve was normal in structure. Pulmonic valve regurgitation is not visualized. Pulmonic regurgitation is not visualized. Aorta: The aortic root, ascending aorta and aortic arch are all structurally normal, with no evidence of dilitation or obstruction. Venous: The inferior vena cava was not well visualized. The inferior vena cava is normal in size with greater than 50% respiratory variability, suggesting right atrial pressure of 3 mmHg. IAS/Shunts: No atrial level shunt detected by color flow Doppler. There is no evidence of a patent foramen ovale. No ventricular septal defect is seen or detected. There is no evidence of an atrial  septal defect.   Diastology LV e' lateral:   5.87 cm/s LV E/e' lateral: 11.5 LV e' medial:    4.90 cm/s LV E/e' medial:  13.7  RIGHT VENTRICLE RV S prime:     28.60 cm/s TAPSE (M-mode): 2.1 cm LEFT ATRIUM             Index       RIGHT ATRIUM           Index LA Vol (A2C):   30.0 ml 16.31 ml/m RA Area:     12.10 cm LA Vol (A4C):   21.2 ml 11.53 ml/m RA Volume:   30.10 ml  16.37 ml/m LA Biplane Vol: 25.3 ml 13.76 ml/m  AORTIC VALVE LVOT Vmax:   70.30 cm/s LVOT Vmean:  50.200 cm/s LVOT VTI:    0.137 m MITRAL VALVE                        TRICUSPID VALVE MV Area (PHT): 2.96 cm             TR Peak grad:   24.2 mmHg MV PHT:        74.24 msec  TR Vmax:        246.00 cm/s MV Decel Time: 256 msec MV E velocity: 67.30 cm/s 103 cm/s  SHUNTS MV A velocity: 65.10 cm/s 70.3 cm/s Systemic VTI: 0.14 m MV E/A ratio:  1.03       1.5  Skeet Latch MD Electronically signed by Skeet Latch MD Signature Date/Time: 01/02/2020/6:33:38 PM    Final    ECHOCARDIOGRAM LIMITED  Result Date: 01/08/2020   ECHOCARDIOGRAM LIMITED REPORT   Patient Name:   Ikaika Showers. Date of Exam: 01/08/2020 Medical Rec #:  [id number]           Height:       71.0 in Accession #:    [id number]          Weight:       145.0 lb Date of Birth:  1941-08-30          BSA:          1.84 m Patient Age:    58 years            BP:           157/66 mmHg Patient Gender: M                   HR:           71 bpm. Exam Location:  Inpatient  Procedure: Limited Echo, Limited Color Doppler and Color Doppler Indications:    Pericardial Effusion  History:        Patient has prior history of Echocardiogram examinations, most                 recent 01/02/2020. Covid-19 Positive  Sonographer:    Mikki Santee RDCS (AE) Referring Phys: Eunice Blase VIJAYA AKULA IMPRESSIONS  1. Left ventricular ejection fraction, by visual estimation, is 55 to 60%.  2. Moderate to large pericardial effusion. No significant change since the last exam 01/02/20 on side by side comparison.  No RV diastolic collapse. Suboptimal Doppler alignment of mitral inflow, no definite respiratory variability. Hypertensive and normal heart rate. These findings suggest no evidence of tamponade physiology. Hepatic vein Doppler not performed. IVC not visualized. Borderline respiratory related leftward ventricular septal shift. FINDINGS  Left Ventricle: Left ventricular ejection fraction, by visual estimation, is 55 to 60%. Pericardium: Moderate to large pericardial effusion is seen. Moderate to large pericardial effusion.   Diastology                 Normals LV e' lateral:   8.27 cm/s 6.42 cm/s LV E/e' lateral: 11.8      15.4 LV e' medial:    9.46 cm/s 6.96 cm/s LV E/e' medial:  10.3      6.96  MITRAL VALVE              Normals   TRICUSPID VALVE             Normals MV Area (PHT): 3.72 cm             TR Peak grad:   24.2 mmHg MV PHT:        59.16 msec 55 ms     TR Vmax:        246.00 cm/s 288 cm/s MV Decel Time: 204 msec   187 ms MV E velocity: 97.30 cm/s 103 cm/s MV A velocity: 90.00 cm/s 70.3 cm/s MV E/A ratio:  1.08       1.5  Cherlynn Kaiser MD Electronically signed by Nadean Corwin  Margaretann Loveless MD Signature Date/Time: 01/08/2020/7:20:57 PM    Final    HYBRID OR IMAGING ([place])  Result Date: 01/06/2020 There is no interpretation for this exam.  This order is for images obtained during a surgical procedure.  Please See "Surgeries" Tab for more information regarding the procedure.     TODAY-DAY OF DISCHARGE:  Subjective:   [name] today has no headache,no chest abdominal pain,no new weakness tingling or numbness, feels much better   Objective:   Blood pressure (!) 136/59, pulse 71, temperature 97.8 F (36.6 C), temperature source Oral, resp. rate 11, height 5\' 11"  (1.803 m), weight 65.8 kg, SpO2 97 %.  Intake/Output Summary (Last 24 hours) at 01/10/2020 1251 Last data filed at 01/10/2020 1241 Gross per 24 hour  Intake 546.94 ml  Output 2222 ml  Net -1675.06 ml   Filed Weights   01/02/20 1017    Weight: 65.8 kg    Exam: Awake Alert, Oriented *3, No new F.N deficits, Normal affect .AT,PERRAL Supple Neck,No JVD, No cervical lymphadenopathy appriciated.  Symmetrical Chest wall movement, Good air movement bilaterally, CTAB RRR,No Gallops,Rubs or new Murmurs, No Parasternal Heave +ve B.Sounds, Abd Soft, Non tender, No organomegaly appriciated, No rebound -guarding or rigidity. No Cyanosis, Clubbing or edema, No new Rash or bruise   PERTINENT RADIOLOGIC STUDIES: DG Chest 2 View  Result Date: 01/05/2020 CLINICAL DATA:  Patient with stage IV lung cancer. Left lower quadrant abdominal pain. Diarrhea. EXAM: CHEST - 2 VIEW COMPARISON:  CT scan from January 05, 2020 at 11:39 a.m. FINDINGS: Free air seen under the diaphragm, also appreciated on the CT scan from earlier today. Healed right rib fractures. Mild infiltrate in the medial right lung base, more prominent since January 02, 2020. Pleural thickening versus small left pleural effusion. Atelectasis or scar in the left base. The heart, hila, mediastinum, lungs, and pleura are otherwise unchanged. IMPRESSION: 1. Free air under the diaphragm. This finding was seen on the CT scan from earlier today and called to Dr. Tana Coast by Dr. Jobe Igo at 12:30 p.m. 2. Mild opacity in the right base is likely atelectasis based on CT imaging from earlier today. 3. No other changes. Electronically Signed   By: Dorise Bullion III M.D   On: 01/05/2020 12:43   CT ABDOMEN PELVIS W CONTRAST  Result Date: 01/05/2020 CLINICAL DATA:  Nausea and vomiting. Acute diverticulitis. Worsening pain. Fevers. EXAM: CT ABDOMEN AND PELVIS WITH CONTRAST TECHNIQUE: Multidetector CT imaging of the abdomen and pelvis was performed using the standard protocol following bolus administration of intravenous contrast. CONTRAST:  113mL OMNIPAQUE IOHEXOL 300 MG/ML  SOLN COMPARISON:  01/02/2020 FINDINGS: Lower chest: Bibasilar atelectasis. Normal heart size with small chronic pericardial  effusion. Small bilateral pleural effusions, new. Multivessel coronary artery atherosclerosis. Hepatobiliary: Scattered well-circumscribed low-density liver lesions are likely cysts. Normal gallbladder, without biliary ductal dilatation. Pancreas: Normal, without mass or ductal dilatation. Spleen: Normal in size, without focal abnormality. Adrenals/Urinary Tract: Normal left adrenal gland. Mild right adrenal thickening. Right renal cortical thinning. No hydronephrosis. Decompressed urinary bladder around a Foley catheter. Stomach/Bowel: Normal stomach, without wall thickening. Scattered colonic diverticula. Increased wall thickening involving the proximal sigmoid, including on 67/2. Mid small bowel loops measure up to 2.4 cm. Pelvic small bowel loops are mildly thickened, including on 73/2. Vascular/Lymphatic: Advanced aortic and branch vessel atherosclerosis. No abdominopelvic adenopathy. Reproductive: Normal prostate. Other: New small volume cul-de-sac fluid, including on 71/2. Extensive, moderate volume free intraperitoneal air, including within the right upper quadrant on 27/2 and  adjacent the right lobe of the liver on 29/2. Multiple small locules of pelvic free air, including on 61/2. Enhancing small volume fluid within the anterior left pelvis on 66/2 and up to 3.8 x 1.5 cm just inferior to this on 71/2. Musculoskeletal: Bilateral remote rib fractures. Lumbosacral spondylosis. IMPRESSION: 1. Moderate free intraperitoneal air, likely due to complicated sigmoid diverticulitis. Small volume pericolonic fluid and enhancement, suspicious for infected ascites. 2. Borderline small bowel dilatation, suggesting adynamic ileus. Small bowel wall thickening within the pelvis is likely due to secondary enteritis. 3. New small bilateral pleural effusions with similar pericardial effusion. 4. Coronary artery atherosclerosis. Aortic Atherosclerosis (ICD10-I70.0). Critical test results telephoned to Dr. Tana Coast. At the time of  interpretation at 12:30 p.m.On 01/05/2020. Electronically Signed   By: Abigail Miyamoto M.D.   On: 01/05/2020 12:32   CT ABDOMEN PELVIS W CONTRAST  Result Date: 01/02/2020 CLINICAL DATA:  Lower abdominal pain. The patient is on immunotherapy for lung carcinoma. EXAM: CT ABDOMEN AND PELVIS WITH CONTRAST TECHNIQUE: Multidetector CT imaging of the abdomen and pelvis was performed using the standard protocol following bolus administration of intravenous contrast. CONTRAST:  100 mL OMNIPAQUE IOHEXOL 300 MG/ML  SOLN COMPARISON:  CT chest, abdomen and pelvis 05/13/2019. FINDINGS: Lower chest: No pleural effusion. Small pericardial effusion is unchanged. Lung bases are emphysematous. There is mild dependent atelectasis. Hepatobiliary: A few small low attenuating lesions in the liver likely representing cysts are unchanged. Status post cholecystectomy. Biliary tree unremarkable. Pancreas: Unremarkable. No pancreatic ductal dilatation or surrounding inflammatory changes. Spleen: Normal in size without focal abnormality. 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 The left adrenal gland appears normal. There is some scarring in the right kidney which is unchanged. The kidneys otherwise appear normal. No stones or hydronephrosis. The urinary bladder is distended but otherwise unremarkable. Stomach/Bowel: Diverticulosis is identified. There is thickening and stranding near the junction of the descending and sigmoid consistent with acute diverticulitis. No abscess or perforation. The stomach and small bowel appear normal. The appendix is normal in appearance. Note is made that the cecum is positioned in the right upper quadrant. Vascular/Lymphatic: Aortic atherosclerosis. No enlarged abdominal or pelvic lymph nodes. Reproductive: Prostate is unremarkable. Other: None. Musculoskeletal: No acute or focal abnormality. Severe degenerative change throughout the visualized spine is noted. IMPRESSION:  Diverticulosis with acute diverticulitis the junction of the distal descending and sigmoid colon. No abscess or perforation. No change in a moderate pericardial effusion. Emphysema. Extensive atherosclerosis. Electronically Signed   By: Inge Rise M.D.   On: 01/02/2020 13:10   DG CHEST PORT 1 VIEW  Result Date: 01/02/2020 CLINICAL DATA:  79 year old male with stage IV lung cancer, SI ADH. Undergoing chemotherapy. Positive COVID-19 yesterday. EXAM: PORTABLE CHEST 1 VIEW COMPARISON:  CT Abdomen and Pelvis earlier today. Chest CT 09/02/2019 and earlier. FINDINGS: Portable AP semi upright view at 1741 hours. Stable lung volumes and mediastinal contours since 2020. Numerous chronic right rib fractures. Chronic reticular opacity in the right upper lobe and near the left hilum. Elsewhere when allowing for portable technique the lungs are clear. No pneumothorax, pulmonary edema or pleural effusion. Stable visualized osseous structures. IMPRESSION: Stable radiographic appearance of chronic lung disease since July 2020. No acute cardiopulmonary abnormality. Electronically Signed   By: Genevie Ann M.D.   On: 01/02/2020 18:38   VAS [place] UPPER EXTREMITY ARTERIAL DUPLEX  Result Date: 01/06/2020 UPPER EXTREMITY DUPLEX STUDY Indications: Patient complains of cool left upper extremity with pallor. History:     Patient  has a history ofrecent left upper extremity arterial line.  Comparison Study: No prior study. Performing Technologist: Maudry Mayhew MHA, RDMS, RVT, RDCS  Examination Guidelines: A complete evaluation includes B-mode imaging, spectral Doppler, color Doppler, and power Doppler as needed of all accessible portions of each vessel. Bilateral testing is considered an integral part of a complete examination. Limited examinations for reoccurring indications may be performed as noted.  Right Doppler Findings: +---------------+----------+--------+--------+--------+ Site           PSV  (cm/s)WaveformStenosisComments +---------------+----------+--------+--------+--------+ Subclavian Prox134       biphasic                 +---------------+----------+--------+--------+--------+  Left Doppler Findings: +---------------+----------+--------------+--------+--------+ Site           PSV (cm/s)Waveform      StenosisComments +---------------+----------+--------------+--------+--------+ Subclavian Prox17        monophasic                     +---------------+----------+--------------+--------+--------+ Subclavian Mid 12        monophasic                     +---------------+----------+--------------+--------+--------+ Axillary       15.00     monophasic                     +---------------+----------+--------------+--------+--------+ Brachial Prox  14        monophasic                     +---------------+----------+--------------+--------+--------+ Brachial Dist  11        monophasic                     +---------------+----------+--------------+--------+--------+ Radial Prox    4         barely audible                 +---------------+----------+--------------+--------+--------+ Radial Dist              barely audible                 +---------------+----------+--------------+--------+--------+ Ulnar Dist     10        barely audible                 +---------------+----------+--------------+--------+--------+ Palmar Arch    6         barely audible                 +---------------+----------+--------------+--------+--------+   Summary:  Right: No obstruction visualized in the right subclavian artery. Left: Monophasic flow at the proximal left subclavian artery is       suggestive of proximal obstruction. Radial, ulnar, and palmar       arch signals are patent, however signals are barely       detectable. *See table(s) above for measurements and observations. Electronically signed by Ruta Hinds MD on 01/06/2020 at 5:06:56 PM.    Final      ECHOCARDIOGRAM COMPLETE  Result Date: 01/02/2020   ECHOCARDIOGRAM REPORT   Patient Name:   Jamarkus Lisbon. Date of Exam: 01/02/2020 Medical Rec #:  [id number]           Height:       71.0 in Accession #:    [id number]          Weight:       145.0 lb Date of Birth:  December 10, 1940  BSA:          1.84 m Patient Age:    53 years            BP:           152/65 mmHg Patient Gender: M                   HR:           82 bpm. Exam Location:  Inpatient Procedure: 2D Echo, Cardiac Doppler and Color Doppler Indications:    Pericardial effusion 423.9  History:        Patient has prior history of Echocardiogram examinations, most                 recent 07/26/2014. Arrythmias:Atrial Fibrillation and Atrial                 Flutter; Risk Factors:Hypertension and Former Smoker. GERD.  Sonographer:    Paulita Fujita RDCS Referring Phys: [id number] AMRIT ADHIKARI  Sonographer Comments: Image acquisition challenging due to patient body habitus. IMPRESSIONS  1. Left ventricular ejection fraction, by visual estimation, is 60 to 65%. The left ventricle has normal function. There is no left ventricular hypertrophy.  2. Left ventricular diastolic parameters are consistent with Grade I diastolic dysfunction (impaired relaxation).  3. Global right ventricle has normal systolic function.The right ventricular size is normal. No increase in right ventricular wall thickness.  4. Left atrial size was normal.  5. Right atrial size was normal.  6. Large pericardial effusion.  7. The pericardial effusion is anterior to the right ventricle.  8. There is inversion of the right ventricular wall and excessive respiratory variation in the tricuspid valve spectral Doppler velocities.  9. The mitral valve is normal in structure. Mild mitral valve regurgitation. No evidence of mitral stenosis. 10. The tricuspid valve is normal in structure. 11. The tricuspid valve is normal in structure. Tricuspid valve regurgitation is mild. 12. The aortic valve  is normal in structure. Aortic valve regurgitation is not visualized. No evidence of aortic valve sclerosis or stenosis. 13. The pulmonic valve was normal in structure. Pulmonic valve regurgitation is not visualized. 14. Mildly elevated pulmonary artery systolic pressure. 15. The inferior vena cava is normal in size with greater than 50% respiratory variability, suggesting right atrial pressure of 3 mmHg. FINDINGS  Left Ventricle: Left ventricular ejection fraction, by visual estimation, is 60 to 65%. The left ventricle has normal function. The left ventricle is not well visualized. There is no left ventricular hypertrophy. Left ventricular diastolic parameters are consistent with Grade I diastolic dysfunction (impaired relaxation). Indeterminate filling pressures. Right Ventricle: The right ventricular size is normal. No increase in right ventricular wall thickness. Global RV systolic function is has normal systolic function. The tricuspid regurgitant velocity is 2.46 m/s, and with an assumed right atrial pressure  of 8 mmHg, the estimated right ventricular systolic pressure is mildly elevated at 32.2 mmHg. Left Atrium: Left atrial size was normal in size. Right Atrium: Right atrial size was normal in size Pericardium: A large pericardial effusion is present. The pericardial effusion is anterior to the right ventricle. There is inversion of the right ventricular wall and excessive respiratory variation in the tricuspid valve spectral Doppler velocities. Pericardial effusion measuring up to 1.86 cm. Unable to visualize the IVC. Findings may be con. Mitral Valve: The mitral valve is normal in structure. Mild mitral valve regurgitation. No evidence of mitral valve stenosis by observation. Tricuspid Valve: The tricuspid valve is  normal in structure. Tricuspid valve regurgitation is mild. Aortic Valve: The aortic valve is normal in structure. Aortic valve regurgitation is not visualized. The aortic valve is structurally  normal, with no evidence of sclerosis or stenosis. Pulmonic Valve: The pulmonic valve was normal in structure. Pulmonic valve regurgitation is not visualized. Pulmonic regurgitation is not visualized. Aorta: The aortic root, ascending aorta and aortic arch are all structurally normal, with no evidence of dilitation or obstruction. Venous: The inferior vena cava was not well visualized. The inferior vena cava is normal in size with greater than 50% respiratory variability, suggesting right atrial pressure of 3 mmHg. IAS/Shunts: No atrial level shunt detected by color flow Doppler. There is no evidence of a patent foramen ovale. No ventricular septal defect is seen or detected. There is no evidence of an atrial septal defect.   Diastology LV e' lateral:   5.87 cm/s LV E/e' lateral: 11.5 LV e' medial:    4.90 cm/s LV E/e' medial:  13.7  RIGHT VENTRICLE RV S prime:     28.60 cm/s TAPSE (M-mode): 2.1 cm LEFT ATRIUM             Index       RIGHT ATRIUM           Index LA Vol (A2C):   30.0 ml 16.31 ml/m RA Area:     12.10 cm LA Vol (A4C):   21.2 ml 11.53 ml/m RA Volume:   30.10 ml  16.37 ml/m LA Biplane Vol: 25.3 ml 13.76 ml/m  AORTIC VALVE LVOT Vmax:   70.30 cm/s LVOT Vmean:  50.200 cm/s LVOT VTI:    0.137 m MITRAL VALVE                        TRICUSPID VALVE MV Area (PHT): 2.96 cm             TR Peak grad:   24.2 mmHg MV PHT:        74.24 msec           TR Vmax:        246.00 cm/s MV Decel Time: 256 msec MV E velocity: 67.30 cm/s 103 cm/s  SHUNTS MV A velocity: 65.10 cm/s 70.3 cm/s Systemic VTI: 0.14 m MV E/A ratio:  1.03       1.5  Skeet Latch MD Electronically signed by Skeet Latch MD Signature Date/Time: 01/02/2020/6:33:38 PM    Final    ECHOCARDIOGRAM LIMITED  Result Date: 01/08/2020   ECHOCARDIOGRAM LIMITED REPORT   Patient Name:   Brenson Hartman. Date of Exam: 01/08/2020 Medical Rec #:  [id number]           Height:       71.0 in Accession #:    [id number]          Weight:       145.0 lb Date of  Birth:  [birth year]          BSA:          1.84 m Patient Age:    49 years            BP:           157/66 mmHg Patient Gender: M                   HR:           71 bpm. Exam Location:  Inpatient  Procedure: Limited Echo, Limited Color Doppler  and Color Doppler Indications:    Pericardial Effusion  History:        Patient has prior history of Echocardiogram examinations, most                 recent 01/02/2020. Covid-19 Positive  Sonographer:    Mikki Santee RDCS (AE) Referring Phys: Eunice Blase VIJAYA AKULA IMPRESSIONS  1. Left ventricular ejection fraction, by visual estimation, is 55 to 60%.  2. Moderate to large pericardial effusion. No significant change since the last exam 01/02/20 on side by side comparison. No RV diastolic collapse. Suboptimal Doppler alignment of mitral inflow, no definite respiratory variability. Hypertensive and normal heart rate. These findings suggest no evidence of tamponade physiology. Hepatic vein Doppler not performed. IVC not visualized. Borderline respiratory related leftward ventricular septal shift. FINDINGS  Left Ventricle: Left ventricular ejection fraction, by visual estimation, is 55 to 60%. Pericardium: Moderate to large pericardial effusion is seen. Moderate to large pericardial effusion.   Diastology                 Normals LV e' lateral:   8.27 cm/s 6.42 cm/s LV E/e' lateral: 11.8      15.4 LV e' medial:    9.46 cm/s 6.96 cm/s LV E/e' medial:  10.3      6.96  MITRAL VALVE              Normals   TRICUSPID VALVE             Normals MV Area (PHT): 3.72 cm             TR Peak grad:   24.2 mmHg MV PHT:        59.16 msec 55 ms     TR Vmax:        246.00 cm/s 288 cm/s MV Decel Time: 204 msec   187 ms MV E velocity: 97.30 cm/s 103 cm/s MV A velocity: 90.00 cm/s 70.3 cm/s MV E/A ratio:  1.08       1.5  Cherlynn Kaiser MD Electronically signed by Cherlynn Kaiser MD Signature Date/Time: 01/08/2020/7:20:57 PM    Final    HYBRID OR IMAGING ([place])  Result Date: 01/06/2020 There is no  interpretation for this exam.  This order is for images obtained during a surgical procedure.  Please See "Surgeries" Tab for more information regarding the procedure.     PERTINENT LAB RESULTS: CBC: Recent Labs    01/08/20 0505 01/09/20 0330  WBC 9.9 10.3  HGB 11.5* 12.0*  HCT 32.2* 34.9*  PLT 277 317   CMET CMP     Component Value Date/Time   NA 132 (L) 01/08/2020 0505   NA 132 (L) 12/06/2017 0930   K 3.4 (L) 01/08/2020 0505   K 4.0 12/06/2017 0930   CL 100 01/08/2020 0505   CO2 23 01/08/2020 0505   CO2 26 12/06/2017 0930   GLUCOSE 95 01/08/2020 0505   GLUCOSE 89 12/06/2017 0930   BUN 24 (H) 01/08/2020 0505   BUN 15.4 12/06/2017 0930   CREATININE 0.70 01/08/2020 0505   CREATININE 0.97 12/02/2019 1134   CREATININE 0.9 12/06/2017 0930   CALCIUM 7.6 (L) 01/08/2020 0505   CALCIUM 9.9 12/06/2017 0930   PROT 4.9 (L) 01/05/2020 0340   PROT 6.3 (L) 12/06/2017 0930   ALBUMIN 2.3 (L) 01/05/2020 0340   ALBUMIN 3.7 12/06/2017 0930   AST 21 01/05/2020 0340   AST 17 12/02/2019 1134   AST 20 12/06/2017 0930   ALT  16 01/05/2020 0340   ALT 11 12/02/2019 1134   ALT 14 12/06/2017 0930   ALKPHOS 64 01/05/2020 0340   ALKPHOS 107 12/06/2017 0930   BILITOT 1.0 01/05/2020 0340   BILITOT 0.6 12/02/2019 1134   BILITOT 0.76 12/06/2017 0930   GFRNONAA >60 01/08/2020 0505   GFRNONAA >60 12/02/2019 1134   GFRAA >60 01/08/2020 0505   GFRAA >60 12/02/2019 1134    GFR Estimated Creatinine Clearance: 68.5 mL/min (by C-G formula based on SCr of 0.7 mg/dL). No results for input(s): LIPASE, AMYLASE in the last 72 hours. No results for input(s): CKTOTAL, CKMB, CKMBINDEX, TROPONINI in the last 72 hours. Invalid input(s): POCBNP Recent Labs    01/08/20 0505 01/09/20 0330  DDIMER 2.31* 2.35*   No results for input(s): HGBA1C in the last 72 hours. No results for input(s): CHOL, HDL, LDLCALC, TRIG, CHOLHDL, LDLDIRECT in the last 72 hours. No results for input(s): TSH, T4TOTAL, T3FREE,  THYROIDAB in the last 72 hours.  Invalid input(s): FREET3 Recent Labs    01/08/20 0505 01/09/20 0330  FERRITIN 377* 358*   Coags: No results for input(s): INR in the last 72 hours.  Invalid input(s): PT Microbiology: Recent Results (from the past 240 hour(s))  Novel Coronavirus, NAA ([facility])     Status: Abnormal   Collection Time: 01/01/20  2:24 PM   Specimen: Nasopharyngeal(NP) swabs in vial transport medium   NASOPHARYNGE  TESTING  Result Value Ref Range Status   SARS-CoV-2, NAA Detected (A) Not Detected Final    Comment: This nucleic acid amplification test was developed and its performance characteristics determined by [employer]. Nucleic acid amplification tests include RT-PCR and TMA. This test has not been FDA cleared or approved. This test has been authorized by FDA under an Emergency Use Authorization (EUA). This test is only authorized for the duration of time the declaration that circumstances exist justifying the authorization of the emergency use of in vitro diagnostic tests for detection of SARS-CoV-2 virus and/or diagnosis of COVID-19 infection under section 564(b)(1) of the Act, 21 U.S.C. 094BSJ-6(G) (1), unless the authorization is terminated or revoked sooner. When diagnostic testing is negative, the possibility of a false negative result should be considered in the context of a patient's recent exposures and the presence of clinical signs and symptoms consistent with COVID-19. An individual without symptoms of COVID-19 and who is not shedding SARS-CoV-2 virus wo uld expect to have a negative (not detected) result in this assay.   SARS CORONAVIRUS 2 (TAT 6-24 HRS) Nasopharyngeal Nasopharyngeal Swab     Status: Abnormal   Collection Time: 01/02/20  2:29 PM   Specimen: Nasopharyngeal Swab  Result Value Ref Range Status   SARS Coronavirus 2 POSITIVE (A) NEGATIVE Final    Comment: RESULT CALLED TO, READ BACK BY AND VERIFIED WITH: T BAKER,RN 2015  01/02/2020 D BRADLEY (NOTE) SARS-CoV-2 target nucleic acids are DETECTED. The SARS-CoV-2 RNA is generally detectable in upper and lower respiratory specimens during the acute phase of infection. Positive results are indicative of the presence of SARS-CoV-2 RNA. Clinical correlation with patient history and other diagnostic information is  necessary to determine patient infection status. Positive results do not rule out bacterial infection or co-infection with other viruses.  The expected result is Negative. Fact Sheet for Patients: SugarRoll.be Fact Sheet for Healthcare Providers: https://www.woods-mathews.com/ This test is not yet approved or cleared by the [place] FDA and  has been authorized for detection and/or diagnosis of SARS-CoV-2 by FDA under an Emergency Use Authorization (  EUA). This EUA will remain  in effect (meaning this test can be used) for the  duration of the COVID-19 declaration under Section 564(b)(1) of the Act, 21 U.S.C. section 360bbb-3(b)(1), unless the authorization is terminated or revoked sooner. Performed at [facility], [place] [address] [place], [place] [address]   Culture, blood (routine x 2)     Status: None   Collection Time: 01/03/20 10:10 PM   Specimen: BLOOD  Result Value Ref Range Status   Specimen Description   Final    BLOOD RIGHT ANTECUBITAL Performed at [facility], [place] [address] [place], [place] [address]    Special Requests   Final    BOTTLES DRAWN AEROBIC AND ANAEROBIC Blood Culture adequate volume Performed at [place] [address] [place], [place] [address]    Culture   Final    NO GROWTH 5 DAYS Performed at [facility], [place] [address], [place], [place] [address]    Report Status 01/08/2020 FINAL  Final  Culture, blood (routine x 2)     Status: None   Collection Time: 01/03/20 10:10 PM   Specimen: BLOOD  Result Value Ref Range Status    Specimen Description   Final    BLOOD LEFT HAND Performed at [facility] [address], [place], [place] [address]    Special Requests   Final    BOTTLES DRAWN AEROBIC AND ANAEROBIC Blood Culture adequate volume Performed at [place] [address] [place], [place] [address]    Culture   Final    NO GROWTH 5 DAYS Performed at [facility], [place] [address], [place], [place] [address]    Report Status 01/08/2020 FINAL  Final  Surgical pcr screen     Status: None   Collection Time: 01/05/20  2:45 PM   Specimen: Nasal Mucosa; Nasal Swab  Result Value Ref Range Status   MRSA, PCR NEGATIVE NEGATIVE Final   Staphylococcus aureus NEGATIVE NEGATIVE Final    Comment: (NOTE) The Xpert SA Assay (FDA approved for NASAL specimens in patients 51 years of age and older), is one component of a comprehensive surveillance program. It is not intended to diagnose infection nor to guide or monitor treatment. Performed at [facility], [place] [address], [place], [place] [address]   Culture, blood (routine x 2)     Status: None (Preliminary result)   Collection Time: 01/06/20  8:15 AM   Specimen: BLOOD RIGHT HAND  Result Value Ref Range Status   Specimen Description   Final    BLOOD RIGHT HAND Performed at [facility] [address] [place], [place] [address]    Special Requests   Final    BOTTLES DRAWN AEROBIC AND ANAEROBIC Blood Culture adequate volume Performed at [place] [address] [place], [place] [address]    Culture   Final    NO GROWTH 3 DAYS Performed at [facility], [place] [address], [place], [place] [address]    Report Status PENDING  Incomplete  Culture, blood (routine x 2)     Status: None (Preliminary result)   Collection Time: 01/06/20  8:15 AM   Specimen: BLOOD  Result Value Ref Range Status   Specimen Description   Final    BLOOD RIGHT ARM Performed  at [place] [address], [place], [place] [address]    Special Requests   Final  BOTTLES DRAWN AEROBIC AND ANAEROBIC Blood Culture adequate volume Performed at [place] [address] [place], [place] [address]    Culture   Final    NO GROWTH 3 DAYS Performed at [facility], [place] [address], [place], [place] [address]    Report Status PENDING  Incomplete    FURTHER DISCHARGE INSTRUCTIONS:  Get Medicines reviewed and adjusted: Please take all your medications with you for your next visit with your Primary MD  Laboratory/radiological data: Please request your Primary MD to go over all hospital tests and procedure/radiological results at the follow up, please ask your Primary MD to get all Hospital records sent to his/her office.  In some cases, they will be blood work, cultures and biopsy results pending at the time of your discharge. Please request that your primary care M.D. goes through all the records of your hospital data and follows up on these results.  Also Note the following: If you experience worsening of your admission symptoms, develop shortness of breath, life threatening emergency, suicidal or homicidal thoughts you must seek medical attention immediately by calling 911 or calling your MD immediately  if symptoms less severe.  You must read complete instructions/literature along with all the possible adverse reactions/side effects for all the Medicines you take and that have been prescribed to you. Take any new Medicines after you have completely understood and accpet all the possible adverse reactions/side effects.   Do not drive when taking Pain medications or sleeping medications (Benzodaizepines)  Do not take more than prescribed Pain, Sleep and Anxiety Medications. It is not advisable to combine anxiety,sleep and pain medications without talking with your primary care practitioner  Special Instructions: If  you have smoked or chewed Tobacco  in the last 2 yrs please stop smoking, stop any regular Alcohol  and or any Recreational drug use.  Wear Seat belts while driving.  Please note: You were cared for by a hospitalist during your hospital stay. Once you are discharged, your primary care physician will handle any further medical issues. Please note that NO REFILLS for any discharge medications will be authorized once you are discharged, as it is imperative that you return to your primary care physician (or establish a relationship with a primary care physician if you do not have one) for your post hospital discharge needs so that they can reassess your need for medications and monitor your lab values.  Total Time spent coordinating discharge including counseling, education and face to face time equals 35 minutes.  SignedOren Binet 01/10/2020 12:51 PM

## 2020-01-10 NOTE — Progress Notes (Signed)
PROGRESS NOTE                                                                                                                                                                                                             Patient Demographics:    Anthony Curry, is a 79 y.o. male, DOB - 09-Nov-1941, AGT:[id number]  Outpatient Primary MD for the patient is [name], MD   Admit date - 01/02/2020   LOS - 8  Chief Complaint  Patient presents with  . Abdominal Pain       Brief Narrative: Patient is a 79 y.o. 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  Subsequent hospital course was complicated by ischemic left hand-patient underwent left brachial embolectomy on 2/1.  See below for further details   Subjective:   Foley catheter was removed yesterday-apparently has not urinated yet-bladder ultrasound on this morning-more than 600 cc-Foley catheter placed. He still has some pain in his operative site-but no other major issues overnight.   Assessment  & Plan :   Acute diverticulitis with perforation-s/p exploratory laparotomy with sigmoid resection and colostomy creation on 1/31: General surgery followed closely-tolerating advancement in diet-now on soft diet. JP drain discontinued on 2/5. Per general surgery-does not require Zosyn any longer. Ostomy care per general surgery.  Antibiotics: Zosyn: 1/28>>2/5  Ischemia to left hand-s/p left subclavian axillary brachial radial ulnar embolectomy on 2/1: Vascular surgery has signed off-was on IV heparin but has been transitioned to Eliquis.   Recent echo  on 01/02/2020 with preserved EF without any embolic source-but patient does have a history of PAF.  Acute Hypoxic Resp Failure due to Covid 19 Viral pneumonia: Improved-down to just 1-2 L of oxygen. Completed remdesivir on 2/4. CRP still elevated but trending down-suspect CRP elevation more from surgical issues rather than pneumonia.   Fever: afebrile  O2 requirements:  SpO2: 97 % O2 Flow Rate (L/min): 2 L/min   COVID-19 Labs: Recent Labs    01/08/20 0505 01/09/20 0330  DDIMER 2.31* 2.35*  FERRITIN 377* 358*  CRP 10.6* 9.1*       Component Value Date/Time  BNP 109.6 (H) 01/05/2020 1119    No results for input(s): PROCALCITON in the last 168 hours.  Lab Results  Component Value Date   SARSCOV2NAA POSITIVE (A) 01/02/2020   SARSCOV2NAA Detected (A) 01/01/2020   [place] Not Detected 12/27/2019   [place] Not Detected 11/25/2019     COVID-19 Medications: Remdesivir: 1/31>> 2/4 Convalescent Plasma: 1/31 x 1  Prone/Incentive Spirometry: encouraged  incentive spirometry use 3-4/hour.  DVT Prophylaxis  : IV heparin  Pericardial effusion: Previously seen by cardiology with recommendations for outpatient follow-up-repeat echocardiogram on 2/3-with no tamponade physiology.  Etiology of effusion is likely from underlying lung cancer-and I suspect is now stable to be followed by oncology/PCP in the outpatient setting.  PAF: Rate controlled-continue Cardizem-was on IV heparin-since repeat echo on 2/3 does not show any tamponade physiology-transition to Eliquis.  Acute urinary retention: Had Foley catheter in place-this was discontinued on 2/4-unfortunately he has not voided-and has redeveloped acute urinary retention-Foley catheter to be reinserted. Continue Flomax-add finasteride. Patient will need outpatient voiding trial  Hematuria: Resolved  History of SIADH: Likely secondary to underlying lung cancer-sodium level stable-on demeclocycline  Hypokalemia:  Repleted.  Hypothyroidism: Continue Synthroid-  History of non-small cell cancer of the lung: Follows with Dr. Earlie Server  Deconditioning/debility: Secondary to acute illness-evaluated by PT services-initially recommendations for for home health-but now recommendations are for SNF. Family debating whether to go home or to SNF.   Nutrition Problem: Nutrition Problem: Increased nutrient needs Etiology: acute illness, catabolic FBPZWCH(ENIDP-82 infection) Signs/Symptoms: estimated needs Interventions: Refer to RD note for recommendations  Obesity: Estimated body mass index is 20.22 kg/m as calculated from the following:   Height as of this encounter: 5\' 11"  (1.803 m).   Weight as of this encounter: 65.8 kg.   Consults  : CCS, cardiology  Procedures  : See above  ABG:    Component Value Date/Time   HCO3 25.5 (H) 05/29/2007 1403   TCO2 27 05/29/2007 1403    Vent Settings: N/A  Condition - Guarded  Family Communication  :  Daughter updated over the phone 2/5  Code Status :  Full Code  Diet :  Diet Order            DIET SOFT Room service appropriate? Yes; Fluid consistency: Thin  Diet effective now               Disposition Plan  :  Remain hospitalized-probably SNF on discharge-we will consult social work.  Barriers to discharge: awaiting SNF bed  Antimicorbials  :    Anti-infectives (From admission, onward)   Start     Dose/Rate Route Frequency Ordered Stop   01/06/20 1000  remdesivir 100 mg in sodium chloride 0.9 % 100 mL IVPB     100 mg 200 mL/hr over 30 Minutes Intravenous Daily 01/05/20 0846 01/09/20 0942   01/05/20 1000  remdesivir 200 mg in sodium chloride 0.9% 250 mL IVPB     200 mg 580 mL/hr over 30 Minutes Intravenous Once 01/05/20 0846 01/05/20 1002   01/02/20 2000  piperacillin-tazobactam (ZOSYN) IVPB 3.375 g     3.375 g 12.5 mL/hr over 240 Minutes Intravenous Every 8 hours 01/02/20 1415 01/10/20 2359   01/02/20 1500  demeclocycline (DECLOMYCIN)  tablet 150 mg    Note to Pharmacy: Take 2 tablets twice a day     150 mg Oral 2 times daily 01/02/20 1408     01/02/20 1345  piperacillin-tazobactam (ZOSYN) IVPB 3.375 g     3.375 g 100 mL/hr  over 30 Minutes Intravenous  Once 01/02/20 1331 01/02/20 1528      Inpatient Medications  Scheduled Meds: . acetaminophen  1,000 mg Oral Q6H  . apixaban  5 mg Oral BID  . vitamin C  500 mg Oral BID  . chlorhexidine  15 mL Mouth Rinse BID  . Chlorhexidine Gluconate Cloth  6 each Topical Daily  . demeclocycline  150 mg Oral BID  . diltiazem  180 mg Oral Q1200  . feeding supplement (ENSURE ENLIVE)  237 mL Oral BID BM  . feeding supplement (PRO-STAT SUGAR FREE 64)  30 mL Oral BID  . fluticasone  2 spray Each Nare QHS  . gabapentin  200 mg Oral BID  . Ipratropium-Albuterol  1 puff Inhalation Q6H  . levothyroxine  50 mcg Oral QAC breakfast  . mouth rinse  15 mL Mouth Rinse q12n4p  . methocarbamol  750 mg Oral TID  . mirtazapine  30 mg Oral QHS  . mometasone-formoterol  2 puff Inhalation BID  . tamsulosin  0.4 mg Oral BID  . thiamine  100 mg Oral Daily  . zinc sulfate  220 mg Oral Daily   Continuous Infusions: . sodium chloride 250 mL (01/06/20 2225)  . piperacillin-tazobactam (ZOSYN)  IV 3.375 g (01/10/20 1126)  . sodium chloride Stopped (01/05/20 2120)   PRN Meds:.sodium chloride, albuterol, ALPRAZolam, hydrALAZINE, HYDROmorphone (DILAUDID) injection, ondansetron (ZOFRAN) IV, oxyCODONE   Time Spent in minutes  25  See all Orders from today for further details   Oren Binet M.D on 01/10/2020 at 11:48 AM  To page go to [email or web address] - use universal password  [facility] -  Office  [phone]    Objective:   Vitals:   01/09/20 2003 01/09/20 2342 01/10/20 0330 01/10/20 0737  BP: (!) 122/53 (!) 146/54 (!) 138/57 126/90  Pulse: 64 63 (!) 59 64  Resp: 13 (!) 21 17 15   Temp: 98.3 F (36.8 C) 97.7 F (36.5 C) 98.2 F (36.8 C) 98 F (36.7 C)  TempSrc: Oral Oral Oral  Oral  SpO2: 95% 96% 97% 97%  Weight:      Height:        Wt Readings from Last 3 Encounters:  01/02/20 65.8 kg  12/02/19 66.6 kg  10/29/19 65 kg     Intake/Output Summary (Last 24 hours) at 01/10/2020 1148 Last data filed at 01/10/2020 1129 Gross per 24 hour  Intake 1086.94 ml  Output 1337 ml  Net -250.06 ml     Physical Exam Gen Exam:Alert awake-not in any distress HEENT:atraumatic, normocephalic Chest: B/L clear to auscultation anteriorly CVS:S1S2 regular Abdomen:soft non tender, non distended-ostomy in place. Extremities:no edema Neurology: Non focal Skin: no rash   Data Review:    CBC Recent Labs  Lab 01/05/20 2035 01/06/20 0540 01/07/20 0421 01/08/20 0505 01/09/20 0330  WBC 14.9* 13.8* 13.4* 9.9 10.3  HGB 12.0* 12.1* 11.2* 11.5* 12.0*  HCT 34.7* 35.6* 32.4* 32.2* 34.9*  PLT 227 220 273 277 317  MCV 88.7 91.8 89.3 87.0 88.6  MCH 30.7 31.2 30.9 31.1 30.5  MCHC 34.6 34.0 34.6 35.7 34.4  RDW 14.1 14.3 14.2 14.0 14.1  LYMPHSABS 0.1*  --   --   --   --   MONOABS 0.6  --   --   --   --   EOSABS 0.0  --   --   --   --   BASOSABS 0.0  --   --   --   --  Chemistries  Recent Labs  Lab 01/04/20 0225 01/05/20 0340 01/06/20 0540 01/07/20 0421 01/08/20 0505  NA 131* 130* 132* 134* 132*  K 3.7 3.3* 4.1 3.8 3.4*  CL 100 101 102 104 100  CO2 21* 21* 21* 24 23  GLUCOSE 107* 83 148* 109* 95  BUN 20 18 20  27* 24*  CREATININE 0.81 0.72 0.73 0.79 0.70  CALCIUM 8.3* 7.7* 8.1* 8.0* 7.6*  AST 25 21  --   --   --   ALT 15 16  --   --   --   ALKPHOS 63 64  --   --   --   BILITOT 1.3* 1.0  --   --   --    ------------------------------------------------------------------------------------------------------------------ No results for input(s): CHOL, HDL, LDLCALC, TRIG, CHOLHDL, LDLDIRECT in the last 72 hours.  No results found for: HGBA1C ------------------------------------------------------------------------------------------------------------------ No  results for input(s): TSH, T4TOTAL, T3FREE, THYROIDAB in the last 72 hours.  Invalid input(s): FREET3 ------------------------------------------------------------------------------------------------------------------ Recent Labs    01/08/20 0505 01/09/20 0330  FERRITIN 377* 358*    Coagulation profile No results for input(s): INR, PROTIME in the last 168 hours.  Recent Labs    01/08/20 0505 01/09/20 0330  DDIMER 2.31* 2.35*    Cardiac Enzymes No results for input(s): CKMB, TROPONINI, MYOGLOBIN in the last 168 hours.  Invalid input(s): CK ------------------------------------------------------------------------------------------------------------------    Component Value Date/Time   BNP 109.6 (H) 01/05/2020 1119    Micro Results Recent Results (from the past 240 hour(s))  Novel Coronavirus, NAA ([facility])     Status: Abnormal   Collection Time: 01/01/20  2:24 PM   Specimen: Nasopharyngeal(NP) swabs in vial transport medium   NASOPHARYNGE  TESTING  Result Value Ref Range Status   SARS-CoV-2, NAA Detected (A) Not Detected Final    Comment: This nucleic acid amplification test was developed and its performance characteristics determined by [employer]. Nucleic acid amplification tests include RT-PCR and TMA. This test has not been FDA cleared or approved. This test has been authorized by FDA under an Emergency Use Authorization (EUA). This test is only authorized for the duration of time the declaration that circumstances exist justifying the authorization of the emergency use of in vitro diagnostic tests for detection of SARS-CoV-2 virus and/or diagnosis of COVID-19 infection under section 564(b)(1) of the Act, 21 U.S.C. 932TFT-7(D) (1), unless the authorization is terminated or revoked sooner. When diagnostic testing is negative, the possibility of a false negative result should be considered in the context of a patient's recent exposures and the presence of  clinical signs and symptoms consistent with COVID-19. An individual without symptoms of COVID-19 and who is not shedding SARS-CoV-2 virus wo uld expect to have a negative (not detected) result in this assay.   SARS CORONAVIRUS 2 (TAT 6-24 HRS) Nasopharyngeal Nasopharyngeal Swab     Status: Abnormal   Collection Time: 01/02/20  2:29 PM   Specimen: Nasopharyngeal Swab  Result Value Ref Range Status   SARS Coronavirus 2 POSITIVE (A) NEGATIVE Final    Comment: RESULT CALLED TO, READ BACK BY AND VERIFIED WITH: T BAKER,RN 2015 01/02/2020 D BRADLEY (NOTE) SARS-CoV-2 target nucleic acids are DETECTED. The SARS-CoV-2 RNA is generally detectable in upper and lower respiratory specimens during the acute phase of infection. Positive results are indicative of the presence of SARS-CoV-2 RNA. Clinical correlation with patient history and other diagnostic information is  necessary to determine patient infection status. Positive results do not rule out bacterial infection or co-infection with other viruses.  The expected result is Negative. Fact Sheet for Patients: SugarRoll.be Fact Sheet for Healthcare Providers: https://www.woods-mathews.com/ This test is not yet approved or cleared by the [place] FDA and  has been authorized for detection and/or diagnosis of SARS-CoV-2 by FDA under an Emergency Use Authorization (EUA). This EUA will remain  in effect (meaning this test can be used) for the  duration of the COVID-19 declaration under Section 564(b)(1) of the Act, 21 U.S.C. section 360bbb-3(b)(1), unless the authorization is terminated or revoked sooner. Performed at [facility], [place] [address] [place], [place] [address]   Culture, blood (routine x 2)     Status: None   Collection Time: 01/03/20 10:10 PM   Specimen: BLOOD  Result Value Ref Range Status   Specimen Description   Final    BLOOD RIGHT ANTECUBITAL Performed at [facility], [place] [address] [place], [place] [address]    Special Requests   Final    BOTTLES DRAWN AEROBIC AND ANAEROBIC Blood Culture adequate volume Performed at [facility] [address], [place], [place] [address]    Culture   Final    NO GROWTH 5 DAYS Performed at [facility], [place] [address] [place], [place] [address]    Report Status 01/08/2020 FINAL  Final  Culture, blood (routine x 2)     Status: None   Collection Time: 01/03/20 10:10 PM   Specimen: BLOOD  Result Value Ref Range Status   Specimen Description   Final    BLOOD LEFT HAND Performed at [facility] [address] [place], [place] [address]    Special Requests   Final    BOTTLES DRAWN AEROBIC AND ANAEROBIC Blood Culture adequate volume Performed at [facility] [address], [place], [place] [address]    Culture   Final    NO GROWTH 5 DAYS Performed at [facility], [place] [address], [place], [place] [address]    Report Status 01/08/2020 FINAL  Final  Surgical pcr screen     Status: None   Collection Time: 01/05/20  2:45 PM   Specimen: Nasal Mucosa; Nasal Swab  Result Value Ref Range Status   MRSA, PCR NEGATIVE NEGATIVE Final   Staphylococcus aureus NEGATIVE NEGATIVE Final    Comment: (NOTE) The Xpert SA Assay (FDA approved for NASAL specimens in patients 20 years of age and older), is one component of a comprehensive surveillance program. It is not intended to diagnose infection nor to guide or monitor treatment. Performed at [facility], [place] [address], [place], [place] [address]   Culture, blood (routine x 2)     Status: None (Preliminary result)   Collection Time: 01/06/20  8:15 AM   Specimen: BLOOD RIGHT HAND  Result Value Ref Range Status   Specimen Description   Final    BLOOD RIGHT HAND Performed at [facility] [address] [place], [place] [address]    Special Requests    Final    BOTTLES DRAWN AEROBIC AND ANAEROBIC Blood Culture adequate volume Performed at [facility] [address], [place], [place] [address]    Culture   Final    NO GROWTH 3 DAYS Performed at [facility], [place] [address], [place], [place] [address]    Report Status PENDING  Incomplete  Culture, blood (routine x 2)     Status: None (Preliminary result)   Collection Time: 01/06/20  8:15 AM   Specimen: BLOOD  Result Value Ref Range Status   Specimen Description   Final    BLOOD RIGHT ARM Performed at [facility] [address] [place], [place] [address]    Special Requests   Final    BOTTLES DRAWN AEROBIC AND ANAEROBIC Blood Culture adequate volume Performed at [facility] [address], [place], [place] [address]    Culture   Final    NO GROWTH 3 DAYS Performed at [facility], [place] [address] [place], [place] [address]    Report Status PENDING  Incomplete    Radiology Reports DG Chest 2 View  Result Date: 01/05/2020 CLINICAL DATA:  Patient with stage IV lung cancer. Left lower quadrant abdominal pain. Diarrhea. EXAM: CHEST - 2 VIEW COMPARISON:  CT scan from January 05, 2020 at 11:39 a.m. FINDINGS: Free air seen under the diaphragm, also appreciated on the CT scan from earlier today. Healed right rib fractures. Mild infiltrate in the medial right lung base, more prominent since January 02, 2020. Pleural thickening versus small left pleural effusion. Atelectasis or scar in the left base. The heart, hila, mediastinum, lungs, and pleura are otherwise unchanged. IMPRESSION: 1. Free air under the diaphragm. This finding was seen on the CT scan from earlier today and called to Dr. Tana Coast by Dr. Jobe Igo at 12:30 p.m. 2. Mild opacity in the right base is likely atelectasis based on CT imaging from earlier today. 3. No other changes. Electronically Signed   By: Dorise Bullion III M.D   On: 01/05/2020 12:43   CT ABDOMEN  PELVIS W CONTRAST  Result Date: 01/05/2020 CLINICAL DATA:  Nausea and vomiting. Acute diverticulitis. Worsening pain. Fevers. EXAM: CT ABDOMEN AND PELVIS WITH CONTRAST TECHNIQUE: Multidetector CT imaging of the abdomen and pelvis was performed using the standard protocol following bolus administration of intravenous contrast. CONTRAST:  14mL OMNIPAQUE IOHEXOL 300 MG/ML  SOLN COMPARISON:  01/02/2020 FINDINGS: Lower chest: Bibasilar atelectasis. Normal heart size with small chronic pericardial effusion. Small bilateral pleural effusions, new. Multivessel coronary artery atherosclerosis. Hepatobiliary: Scattered well-circumscribed low-density liver lesions are likely cysts. Normal gallbladder, without biliary ductal dilatation. Pancreas: Normal, without mass or ductal dilatation. Spleen: Normal in size, without focal abnormality. Adrenals/Urinary Tract: Normal left adrenal gland. Mild right adrenal thickening. Right renal cortical thinning. No hydronephrosis. Decompressed urinary bladder around a Foley catheter. Stomach/Bowel: Normal stomach, without wall thickening. Scattered colonic diverticula. Increased wall thickening involving the proximal sigmoid, including on 67/2. Mid small bowel loops measure up to 2.4 cm. Pelvic small bowel loops are mildly thickened, including on 73/2. Vascular/Lymphatic: Advanced aortic and branch vessel atherosclerosis. No abdominopelvic adenopathy. Reproductive: Normal prostate. Other: New small volume cul-de-sac fluid, including on 71/2. Extensive, moderate volume free intraperitoneal air, including within the right upper quadrant on 27/2 and adjacent the right lobe of the liver on 29/2. Multiple small locules of pelvic free air, including on 61/2. Enhancing small volume fluid within the anterior left pelvis on 66/2 and up to 3.8 x 1.5 cm just inferior to this on 71/2. Musculoskeletal: Bilateral remote rib fractures. Lumbosacral spondylosis. IMPRESSION: 1. Moderate free  intraperitoneal air, likely due to complicated sigmoid diverticulitis. Small volume pericolonic fluid and enhancement, suspicious for infected ascites. 2. Borderline small bowel dilatation, suggesting adynamic ileus. Small bowel wall thickening within the pelvis is likely due to secondary enteritis. 3. New small bilateral pleural effusions with similar pericardial effusion. 4. Coronary artery atherosclerosis. Aortic Atherosclerosis (ICD10-I70.0). Critical test results telephoned  to Dr. Tana Coast. At the time of interpretation at 12:30 p.m.On 01/05/2020. Electronically Signed   By: Abigail Miyamoto M.D.   On: 01/05/2020 12:32   CT ABDOMEN PELVIS W CONTRAST  Result Date: 01/02/2020 CLINICAL DATA:  Lower abdominal pain. The patient is on immunotherapy for lung carcinoma. EXAM: CT ABDOMEN AND PELVIS WITH CONTRAST TECHNIQUE: Multidetector CT imaging of the abdomen and pelvis was performed using the standard protocol following bolus administration of intravenous contrast. CONTRAST:  100 mL OMNIPAQUE IOHEXOL 300 MG/ML  SOLN COMPARISON:  CT chest, abdomen and pelvis 05/13/2019. FINDINGS: Lower chest: No pleural effusion. Small pericardial effusion is unchanged. Lung bases are emphysematous. There is mild dependent atelectasis. Hepatobiliary: A few small low attenuating lesions in the liver likely representing cysts are unchanged. Status post cholecystectomy. Biliary tree unremarkable. Pancreas: Unremarkable. No pancreatic ductal dilatation or surrounding inflammatory changes. Spleen: Normal in size without focal abnormality. 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 The left adrenal gland appears normal. There is some scarring in the right kidney which is unchanged. The kidneys otherwise appear normal. No stones or hydronephrosis. The urinary bladder is distended but otherwise unremarkable. Stomach/Bowel: Diverticulosis is identified. There is thickening and stranding near the junction of the descending  and sigmoid consistent with acute diverticulitis. No abscess or perforation. The stomach and small bowel appear normal. The appendix is normal in appearance. Note is made that the cecum is positioned in the right upper quadrant. Vascular/Lymphatic: Aortic atherosclerosis. No enlarged abdominal or pelvic lymph nodes. Reproductive: Prostate is unremarkable. Other: None. Musculoskeletal: No acute or focal abnormality. Severe degenerative change throughout the visualized spine is noted. IMPRESSION: Diverticulosis with acute diverticulitis the junction of the distal descending and sigmoid colon. No abscess or perforation. No change in a moderate pericardial effusion. Emphysema. Extensive atherosclerosis. Electronically Signed   By: Inge Rise M.D.   On: 01/02/2020 13:10   DG CHEST PORT 1 VIEW  Result Date: 01/02/2020 CLINICAL DATA:  79 year old male with stage IV lung cancer, SI ADH. Undergoing chemotherapy. Positive COVID-19 yesterday. EXAM: PORTABLE CHEST 1 VIEW COMPARISON:  CT Abdomen and Pelvis earlier today. Chest CT 09/02/2019 and earlier. FINDINGS: Portable AP semi upright view at 1741 hours. Stable lung volumes and mediastinal contours since 2020. Numerous chronic right rib fractures. Chronic reticular opacity in the right upper lobe and near the left hilum. Elsewhere when allowing for portable technique the lungs are clear. No pneumothorax, pulmonary edema or pleural effusion. Stable visualized osseous structures. IMPRESSION: Stable radiographic appearance of chronic lung disease since July 2020. No acute cardiopulmonary abnormality. Electronically Signed   By: Genevie Ann M.D.   On: 01/02/2020 18:38   VAS [place] UPPER EXTREMITY ARTERIAL DUPLEX  Result Date: 01/06/2020 UPPER EXTREMITY DUPLEX STUDY Indications: Patient complains of cool left upper extremity with pallor. History:     Patient has a history ofrecent left upper extremity arterial line.  Comparison Study: No prior study. Performing Technologist:  Maudry Mayhew MHA, RDMS, RVT, RDCS  Examination Guidelines: A complete evaluation includes B-mode imaging, spectral Doppler, color Doppler, and power Doppler as needed of all accessible portions of each vessel. Bilateral testing is considered an integral part of a complete examination. Limited examinations for reoccurring indications may be performed as noted.  Right Doppler Findings: +---------------+----------+--------+--------+--------+ Site           PSV (cm/s)WaveformStenosisComments +---------------+----------+--------+--------+--------+ Subclavian [id number]       biphasic                 +---------------+----------+--------+--------+--------+  Left Doppler Findings: +---------------+----------+--------------+--------+--------+ Site           PSV (cm/s)Waveform      StenosisComments +---------------+----------+--------------+--------+--------+ Subclavian Prox17        monophasic                     +---------------+----------+--------------+--------+--------+ Subclavian Mid 12        monophasic                     +---------------+----------+--------------+--------+--------+ Axillary       15.00     monophasic                     +---------------+----------+--------------+--------+--------+ Brachial Prox  14        monophasic                     +---------------+----------+--------------+--------+--------+ Brachial Dist  11        monophasic                     +---------------+----------+--------------+--------+--------+ Radial Prox    4         barely audible                 +---------------+----------+--------------+--------+--------+ Radial Dist              barely audible                 +---------------+----------+--------------+--------+--------+ Ulnar Dist     10        barely audible                 +---------------+----------+--------------+--------+--------+ Palmar Arch    6         barely audible                  +---------------+----------+--------------+--------+--------+   Summary:  Right: No obstruction visualized in the right subclavian artery. Left: Monophasic flow at the proximal left subclavian artery is       suggestive of proximal obstruction. Radial, ulnar, and palmar       arch signals are patent, however signals are barely       detectable. *See table(s) above for measurements and observations. Electronically signed by Ruta Hinds MD on 01/06/2020 at 5:06:56 PM.    Final    ECHOCARDIOGRAM COMPLETE  Result Date: 01/02/2020   ECHOCARDIOGRAM REPORT   Patient Name:   Cashton Hosley. Date of Exam: 01/02/2020 Medical Rec #:  [id number]           Height:       71.0 in Accession #:    [id number]          Weight:       145.0 lb Date of Birth:  11/27/1941          BSA:          1.84 m Patient Age:    44 years            BP:           152/65 mmHg Patient Gender: M                   HR:           82 bpm. Exam Location:  Inpatient Procedure: 2D Echo, Cardiac Doppler and Color Doppler Indications:    Pericardial effusion 423.9  History:        Patient has prior history of  Echocardiogram examinations, most                 recent 07/26/2014. Arrythmias:Atrial Fibrillation and Atrial                 Flutter; Risk Factors:Hypertension and Former Smoker. GERD.  Sonographer:    Paulita Fujita RDCS Referring Phys: [id number] AMRIT ADHIKARI  Sonographer Comments: Image acquisition challenging due to patient body habitus. IMPRESSIONS  1. Left ventricular ejection fraction, by visual estimation, is 60 to 65%. The left ventricle has normal function. There is no left ventricular hypertrophy.  2. Left ventricular diastolic parameters are consistent with Grade I diastolic dysfunction (impaired relaxation).  3. Global right ventricle has normal systolic function.The right ventricular size is normal. No increase in right ventricular wall thickness.  4. Left atrial size was normal.  5. Right atrial size was normal.  6. Large pericardial  effusion.  7. The pericardial effusion is anterior to the right ventricle.  8. There is inversion of the right ventricular wall and excessive respiratory variation in the tricuspid valve spectral Doppler velocities.  9. The mitral valve is normal in structure. Mild mitral valve regurgitation. No evidence of mitral stenosis. 10. The tricuspid valve is normal in structure. 11. The tricuspid valve is normal in structure. Tricuspid valve regurgitation is mild. 12. The aortic valve is normal in structure. Aortic valve regurgitation is not visualized. No evidence of aortic valve sclerosis or stenosis. 13. The pulmonic valve was normal in structure. Pulmonic valve regurgitation is not visualized. 14. Mildly elevated pulmonary artery systolic pressure. 15. The inferior vena cava is normal in size with greater than 50% respiratory variability, suggesting right atrial pressure of 3 mmHg. FINDINGS  Left Ventricle: Left ventricular ejection fraction, by visual estimation, is 60 to 65%. The left ventricle has normal function. The left ventricle is not well visualized. There is no left ventricular hypertrophy. Left ventricular diastolic parameters are consistent with Grade I diastolic dysfunction (impaired relaxation). Indeterminate filling pressures. Right Ventricle: The right ventricular size is normal. No increase in right ventricular wall thickness. Global RV systolic function is has normal systolic function. The tricuspid regurgitant velocity is 2.46 m/s, and with an assumed right atrial pressure  of 8 mmHg, the estimated right ventricular systolic pressure is mildly elevated at 32.2 mmHg. Left Atrium: Left atrial size was normal in size. Right Atrium: Right atrial size was normal in size Pericardium: A large pericardial effusion is present. The pericardial effusion is anterior to the right ventricle. There is inversion of the right ventricular wall and excessive respiratory variation in the tricuspid valve spectral Doppler  velocities. Pericardial effusion measuring up to 1.86 cm. Unable to visualize the IVC. Findings may be con. Mitral Valve: The mitral valve is normal in structure. Mild mitral valve regurgitation. No evidence of mitral valve stenosis by observation. Tricuspid Valve: The tricuspid valve is normal in structure. Tricuspid valve regurgitation is mild. Aortic Valve: The aortic valve is normal in structure. Aortic valve regurgitation is not visualized. The aortic valve is structurally normal, with no evidence of sclerosis or stenosis. Pulmonic Valve: The pulmonic valve was normal in structure. Pulmonic valve regurgitation is not visualized. Pulmonic regurgitation is not visualized. Aorta: The aortic root, ascending aorta and aortic arch are all structurally normal, with no evidence of dilitation or obstruction. Venous: The inferior vena cava was not well visualized. The inferior vena cava is normal in size with greater than 50% respiratory variability, suggesting right atrial pressure of 3 mmHg. IAS/Shunts: No atrial  level shunt detected by color flow Doppler. There is no evidence of a patent foramen ovale. No ventricular septal defect is seen or detected. There is no evidence of an atrial septal defect.   Diastology LV e' lateral:   5.87 cm/s LV E/e' lateral: 11.5 LV e' medial:    4.90 cm/s LV E/e' medial:  13.7  RIGHT VENTRICLE RV S prime:     28.60 cm/s TAPSE (M-mode): 2.1 cm LEFT ATRIUM             Index       RIGHT ATRIUM           Index LA Vol (A2C):   30.0 ml 16.31 ml/m RA Area:     12.10 cm LA Vol (A4C):   21.2 ml 11.53 ml/m RA Volume:   30.10 ml  16.37 ml/m LA Biplane Vol: 25.3 ml 13.76 ml/m  AORTIC VALVE LVOT Vmax:   70.30 cm/s LVOT Vmean:  50.200 cm/s LVOT VTI:    0.137 m MITRAL VALVE                        TRICUSPID VALVE MV Area (PHT): 2.96 cm             TR Peak grad:   24.2 mmHg MV PHT:        74.24 msec           TR Vmax:        246.00 cm/s MV Decel Time: 256 msec MV E velocity: 67.30 cm/s 103 cm/s   SHUNTS MV A velocity: 65.10 cm/s 70.3 cm/s Systemic VTI: 0.14 m MV E/A ratio:  1.03       1.5  Skeet Latch MD Electronically signed by Skeet Latch MD Signature Date/Time: 01/02/2020/6:33:38 PM    Final    ECHOCARDIOGRAM LIMITED  Result Date: 01/08/2020   ECHOCARDIOGRAM LIMITED REPORT   Patient Name:   Ladislao Cohenour. Date of Exam: 01/08/2020 Medical Rec #:  [id number]           Height:       71.0 in Accession #:    [id number]          Weight:       145.0 lb Date of Birth:  [birth year]          BSA:          1.84 m Patient Age:    77 years            BP:           157/66 mmHg Patient Gender: M                   HR:           71 bpm. Exam Location:  Inpatient  Procedure: Limited Echo, Limited Color Doppler and Color Doppler Indications:    Pericardial Effusion  History:        Patient has prior history of Echocardiogram examinations, most                 recent 01/02/2020. Covid-19 Positive  Sonographer:    Mikki Santee RDCS (AE) Referring Phys: Eunice Blase VIJAYA AKULA IMPRESSIONS  1. Left ventricular ejection fraction, by visual estimation, is 55 to 60%.  2. Moderate to large pericardial effusion. No significant change since the last exam 01/02/20 on side by side comparison. No RV diastolic collapse. Suboptimal Doppler alignment of mitral inflow, no definite respiratory variability. Hypertensive and  normal heart rate. These findings suggest no evidence of tamponade physiology. Hepatic vein Doppler not performed. IVC not visualized. Borderline respiratory related leftward ventricular septal shift. FINDINGS  Left Ventricle: Left ventricular ejection fraction, by visual estimation, is 55 to 60%. Pericardium: Moderate to large pericardial effusion is seen. Moderate to large pericardial effusion.   Diastology                 Normals LV e' lateral:   8.27 cm/s 6.42 cm/s LV E/e' lateral: 11.8      15.4 LV e' medial:    9.46 cm/s 6.96 cm/s LV E/e' medial:  10.3      6.96  MITRAL VALVE              Normals    TRICUSPID VALVE             Normals MV Area (PHT): 3.72 cm             TR Peak grad:   24.2 mmHg MV PHT:        59.16 msec 55 ms     TR Vmax:        246.00 cm/s 288 cm/s MV Decel Time: 204 msec   187 ms MV E velocity: 97.30 cm/s 103 cm/s MV A velocity: 90.00 cm/s 70.3 cm/s MV E/A ratio:  1.08       1.5  Cherlynn Kaiser MD Electronically signed by Cherlynn Kaiser MD Signature Date/Time: 01/08/2020/7:20:57 PM    Final    HYBRID OR IMAGING ([place])  Result Date: 01/06/2020 There is no interpretation for this exam.  This order is for images obtained during a surgical procedure.  Please See "Surgeries" Tab for more information regarding the procedure.

## 2020-01-10 NOTE — Progress Notes (Signed)
Patient is still waiting for [facility] to be transported to the facility.

## 2020-01-10 NOTE — Consult Note (Signed)
   [facility] CM Inpatient Consult   01/10/2020  Anthony Curry. 1941-03-15 [id number]   Patient screened for potential [facility] Care Management services due to unplanned readmission risk score of 27%, high.  Per chart review, current disposition plan is for SNF. No [facility] CM identifiable needs at this time.  Netta Cedars, MSN, [facility] Liaison Nurse Mobile Phone [phone]  Toll free office [phone]

## 2020-01-10 NOTE — Care Management Important Message (Signed)
Important Message  Patient Details  Name: Anthony Curry. MRN: [id number] Date of Birth: 1940/12/12   Medicare Important Message Given:  Yes - Important Message mailed due to current National Emergency  Verbal consent obtained due to current National Emergency  Relationship to patient: Spouse/Significant Other Contact Name: Emanuelle Bastos Call Date: 01/10/20  Time: 1258 Phone: [phone] Outcome: Spoke with contact Important Message mailed to: Patient address on file    Delorse Lek 01/10/2020, 12:58 PM

## 2020-01-10 NOTE — Progress Notes (Addendum)
Daughter left CSW voicemail after-hours. SNF preference is [facility]  [employer], LCSWA

## 2020-01-10 NOTE — Discharge Summary (Deleted)
Patient will DC to: [facility]  Anticipated DC date: 01/10/2020  Family notified: Daughter  Transport by: Corey Harold        Per MD patient ready for DC to [facility]. RN, patient, patient's family, and facility notified of DC. Discharge Summary and FL2 sent to facility. RN to call report prior to discharge [phone]). DC packet on chart. Ambulance transport requested for patient.     CSW will sign off for now as social work intervention is no longer needed. Please consult [place] again if new needs arise.

## 2020-01-10 NOTE — Progress Notes (Signed)
Facility provided a number to call report and nurse Norvel Richards received the report.

## 2020-01-10 NOTE — Progress Notes (Signed)
[facility] Progress Note  4 Days Post-Op  Subjective: CC: no energy  Patient reports he is just wiped out and dreads getting up because it exhausts him. He reports some abdominal pain but states the pain medication helps and he just was given some. He is tolerating soft diet but frustrated because his food is cold. Having stool output. Patient was seen by [facility] on 2/2 but stated he didn't remember if anyone had shown him how to care for stoma. He is concerned that his foley was removed yesterday and that he has not urinated.   Objective: Vital signs in last 24 hours: Temp:  [97.7 F (36.5 C)-98.9 F (37.2 C)] 98 F (36.7 C) (02/05 0737) Pulse Rate:  [59-87] 64 (02/05 0737) Resp:  [13-21] 15 (02/05 0737) BP: (110-166)/(53-90) 126/90 (02/05 0737) SpO2:  [93 %-97 %] 97 % (02/05 0737) Last BM Date: (colostomy)  Intake/Output from previous day: 02/04 0701 - 02/05 0700 In: 906.9 [P.O.:480; IV Piggyback:126.9] Out: 320 [Drains:120; Stool:200] Intake/Output this shift: No intake/output data recorded.  PE: Abd: soft, ND, appropriately tender, +BS, midline incision is clean and packed.  Stoma is pink and viable.  Bag with air and soft brown stool.  JP with serous fluid  Lab Results:  Recent Labs    01/08/20 0505 01/09/20 0330  WBC 9.9 10.3  HGB 11.5* 12.0*  HCT 32.2* 34.9*  PLT 277 317   BMET Recent Labs    01/08/20 0505  NA 132*  K 3.4*  CL 100  CO2 23  GLUCOSE 95  BUN 24*  CREATININE 0.70  CALCIUM 7.6*   PT/INR No results for input(s): LABPROT, INR in the last 72 hours. CMP     Component Value Date/Time   NA 132 (L) 01/08/2020 0505   NA 132 (L) 12/06/2017 0930   K 3.4 (L) 01/08/2020 0505   K 4.0 12/06/2017 0930   CL 100 01/08/2020 0505   CO2 23 01/08/2020 0505   CO2 26 12/06/2017 0930   GLUCOSE 95 01/08/2020 0505   GLUCOSE 89 12/06/2017 0930   BUN 24 (H) 01/08/2020 0505   BUN 15.4 12/06/2017 0930   CREATININE 0.70 01/08/2020 0505   CREATININE  0.97 12/02/2019 1134   CREATININE 0.9 12/06/2017 0930   CALCIUM 7.6 (L) 01/08/2020 0505   CALCIUM 9.9 12/06/2017 0930   PROT 4.9 (L) 01/05/2020 0340   PROT 6.3 (L) 12/06/2017 0930   ALBUMIN 2.3 (L) 01/05/2020 0340   ALBUMIN 3.7 12/06/2017 0930   AST 21 01/05/2020 0340   AST 17 12/02/2019 1134   AST 20 12/06/2017 0930   ALT 16 01/05/2020 0340   ALT 11 12/02/2019 1134   ALT 14 12/06/2017 0930   ALKPHOS 64 01/05/2020 0340   ALKPHOS 107 12/06/2017 0930   BILITOT 1.0 01/05/2020 0340   BILITOT 0.6 12/02/2019 1134   BILITOT 0.76 12/06/2017 0930   GFRNONAA >60 01/08/2020 0505   GFRNONAA >60 12/02/2019 1134   GFRAA >60 01/08/2020 0505   GFRAA >60 12/02/2019 1134   Lipase     Component Value Date/Time   LIPASE 19 01/02/2020 1041       Studies/Results: ECHOCARDIOGRAM LIMITED  Result Date: 01/08/2020   ECHOCARDIOGRAM LIMITED REPORT   Patient Name:   Anthony Curry. Date of Exam: 01/08/2020 Medical Rec #:  [id number]           Height:       71.0 in Accession #:    [id number]  Weight:       145.0 lb Date of Birth:  [birth year]          BSA:          1.84 m Patient Age:    79 years            BP:           157/66 mmHg Patient Gender: M                   HR:           71 bpm. Exam Location:  Inpatient  Procedure: Limited Echo, Limited Color Doppler and Color Doppler Indications:    Pericardial Effusion  History:        Patient has prior history of Echocardiogram examinations, most                 recent 01/02/2020. Covid-19 Positive  Sonographer:    Mikki Santee RDCS (AE) Referring Phys: Eunice Blase VIJAYA AKULA IMPRESSIONS  1. Left ventricular ejection fraction, by visual estimation, is 55 to 60%.  2. Moderate to large pericardial effusion. No significant change since the last exam 01/02/20 on side by side comparison. No RV diastolic collapse. Suboptimal Doppler alignment of mitral inflow, no definite respiratory variability. Hypertensive and normal heart rate. These findings suggest no  evidence of tamponade physiology. Hepatic vein Doppler not performed. IVC not visualized. Borderline respiratory related leftward ventricular septal shift. FINDINGS  Left Ventricle: Left ventricular ejection fraction, by visual estimation, is 55 to 60%. Pericardium: Moderate to large pericardial effusion is seen. Moderate to large pericardial effusion.   Diastology                 Normals LV e' lateral:   8.27 cm/s 6.42 cm/s LV E/e' lateral: 11.8      15.4 LV e' medial:    9.46 cm/s 6.96 cm/s LV E/e' medial:  10.3      6.96  MITRAL VALVE              Normals   TRICUSPID VALVE             Normals MV Area (PHT): 3.72 cm             TR Peak grad:   24.2 mmHg MV PHT:        59.16 msec 55 ms     TR Vmax:        246.00 cm/s 288 cm/s MV Decel Time: 204 msec   187 ms MV E velocity: 97.30 cm/s 103 cm/s MV A velocity: 90.00 cm/s 70.3 cm/s MV E/A ratio:  1.08       1.5  Cherlynn Kaiser MD Electronically signed by Cherlynn Kaiser MD Signature Date/Time: 01/08/2020/7:20:57 PM    Final     Anti-infectives: Anti-infectives (From admission, onward)   Start     Dose/Rate Route Frequency Ordered Stop   01/06/20 1000  remdesivir 100 mg in sodium chloride 0.9 % 100 mL IVPB     100 mg 200 mL/hr over 30 Minutes Intravenous Daily 01/05/20 0846 01/09/20 0942   01/05/20 1000  remdesivir 200 mg in sodium chloride 0.9% 250 mL IVPB     200 mg 580 mL/hr over 30 Minutes Intravenous Once 01/05/20 0846 01/05/20 1002   01/02/20 2000  piperacillin-tazobactam (ZOSYN) IVPB 3.375 g     3.375 g 12.5 mL/hr over 240 Minutes Intravenous Every 8 hours 01/02/20 1415 01/10/20 2359   01/02/20 1500  demeclocycline (DECLOMYCIN) tablet  150 mg    Note to Pharmacy: Take 2 tablets twice a day     150 mg Oral 2 times daily 01/02/20 1408     01/02/20 1345  piperacillin-tazobactam (ZOSYN) IVPB 3.375 g     3.375 g 100 mL/hr over 30 Minutes Intravenous  Once 01/02/20 1331 01/02/20 1528       Assessment/Plan Acute hypoxic respiratory failure due  to acute COVID-19 viral pneumonia 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 Kieth Brightly, 01/45forAcute diverticulitis with perforation -soft diet - WOC RN consult for new stoma - ambulate, IS - d/c JP drain today  - BID WD dressing changes to midline wound - will d/c zosyn today, no further abx needed from an abdominal standpoint - stable for discharge from a surgical standpoint, will get follow up in AVS   DLO:PRAF VTE: SCD's,eliquis OA:DLKZG 01/28>2/5,Remdesivir 01/31>2/4; PO declomycin 1/28 Foley:Dc 2/4, has not voided may need foley replaced Follow up:Dr. Kinsinger  LOS: 8 days    Brigid Re , [facility] 01/10/2020, 9:24 AM Please see Amion for pager number during day hours 7:00am-4:30pm

## 2020-01-10 NOTE — Progress Notes (Addendum)
Foley cath was inserted per MD order for urinary retention. Patient tolerated well. 900 ml urine output. Also, JP drain was D/C per MD order (7 ml output). Patient tolerated well; compressive dressing was applied. Will continue to monitor.

## 2020-01-10 NOTE — Progress Notes (Signed)
Patient verbalized that he didn't urinate in the last 24 hrs. Bladder scan showed 888 ml urine retention. MD notified. Will continue to monitor.

## 2020-01-10 NOTE — Progress Notes (Addendum)
Patient was discharged to nursing home ([facility]) by MD order; discharged instructions review and sent to facility with care notes and prescriptions; IV DIC; dressing on abdominal incision was changed, colostomy bag emptied; foley in place; protective foam dressing on sacrum. Writer and charge nurse tried to call facility for report with no success (number is not in service; CSW made aware).  Patient will be transported to facility via [place].

## 2020-01-10 NOTE — TOC Transition Note (Signed)
Transition of Care [facility]) - CM/SW Discharge Note   Patient Details  Name: Anthony Curry. MRN: [id number] Date of Birth: 02/08/1941  [facility] of [facility]) CM/SW Contact:  Ralph Dowdy, Student-Social Work Phone Number: 01/10/2020, 1:57 PM   Clinical Narrative:     Patient will DC to: [facility]  Anticipated DC date: 01/10/2020  Family notified: Daughter [name]  Transport by: Corey Harold        Per MD patient ready for DC to . RN, patient, patient's family, and facility notified of DC. Discharge Summary and FL2 sent to facility. RN to call report prior to discharge [phone]). DC packet on chart. Ambulance transport requested for patient.     CSW will sign off for now as social work intervention is no longer needed. Please consult [place] again if new needs arise.   Final next level of care: Skilled Nursing Facility Barriers to Discharge: No Barriers Identified   Patient Goals and CMS Choice Patient states their goals for this hospitalization and ongoing recovery are:: Rehab CMS Medicare.gov Compare Post Acute Care list provided to:: Patient Represenative (must comment) Choice offered to / list presented to : Patient  Discharge Placement   Existing PASRR number confirmed : 01/10/20          Patient chooses bed at: [facility] Patient to be transferred to facility by: [place] Name of family member notified: [name] Patient and family notified of of transfer: 01/10/20  Discharge Plan and Services                                     Social Determinants of Health (SDOH) Interventions     Readmission Risk Interventions Readmission Risk Prevention Plan 01/06/2020  Transportation Screening Complete  Medication Review [employer] photographer) Complete  Some recent data might be hidden

## 2020-01-10 NOTE — Discharge Instructions (Signed)
[facility], [place] [phone]  OPEN ABDOMINAL SURGERY: POST OP INSTRUCTIONS  Always review your discharge instruction sheet given to you by the facility where your surgery was performed.  IF YOU HAVE DISABILITY OR FAMILY LEAVE FORMS, YOU MUST BRING THEM TO THE OFFICE FOR PROCESSING.  PLEASE DO NOT GIVE THEM TO YOUR DOCTOR.  1. A prescription for pain medication may be given to you upon discharge.  Take your pain medication as prescribed, if needed.  If narcotic pain medicine is not needed, then you may take acetaminophen (Tylenol) or ibuprofen (Advil) as needed. 2. Take your usually prescribed medications unless otherwise directed. 3. If you need a refill on your pain medication, please contact your pharmacy. They will contact our office to request authorization.  Prescriptions will not be filled after 5pm or on week-ends. 4. You should follow a light diet the first few days after arrival home, such as soup and crackers, pudding, etc.unless your doctor has advised otherwise. A high-fiber, low fat diet can be resumed as tolerated.   Be sure to include lots of fluids daily. Most patients will experience some swelling and bruising on the chest and neck area.  Ice packs will help.  Swelling and bruising can take several days to resolve 5. Most patients will experience some swelling and bruising in the area of the incision. Ice pack will help. Swelling and bruising can take several days to resolve..  6. It is common to experience some constipation if taking pain medication after surgery.  Increasing fluid intake and taking a stool softener will usually help or prevent this problem from occurring.  A mild laxative (Milk of Magnesia or Miralax) should be taken according to package directions if there are no bowel movements after 48 hours. 7.  You may have steri-strips (small skin tapes) in place directly over the incision.  These strips should be left on the skin for 7-10 days.  If your  surgeon used skin glue on the incision, you may shower in 24 hours.  The glue will flake off over the next 2-3 weeks.  Any sutures or staples will be removed at the office during your follow-up visit. You may find that a light gauze bandage over your incision may keep your staples from being rubbed or pulled. You may shower and replace the bandage daily. 8. ACTIVITIES:  You may resume regular (light) daily activities beginning the next day--such as daily self-care, walking, climbing stairs--gradually increasing activities as tolerated.  You may have sexual intercourse when it is comfortable.  Refrain from any heavy lifting or straining until approved by your doctor. a. You may drive when you no longer are taking prescription pain medication, you can comfortably wear a seatbelt, and you can safely maneuver your car and apply brakes b. Return to Work: ___________________________________ 74. You should see your doctor in the office for a follow-up appointment approximately two weeks after your surgery.  Make sure that you call for this appointment within a day or two after you arrive home to insure a convenient appointment time. OTHER INSTRUCTIONS:  _____________________________________________________________ _____________________________________________________________  WHEN TO CALL YOUR DOCTOR: 1. Fever over 101.0 2. Inability to urinate 3. Nausea and/or vomiting 4. Extreme swelling or bruising 5. Continued bleeding from incision. 6. Increased pain, redness, or drainage from the incision. 7. Difficulty swallowing or breathing 8. Muscle cramping or spasms. 9. Numbness or tingling in hands or feet or around lips.  The clinic staff is available to  answer your questions during regular business hours.  Please don't hesitate to call and ask to speak to one of the nurses if you have concerns.  For further questions, please visit [email or web address]   Colostomy Home Guide,  Adult  Colostomy surgery is done to create an opening in the front of the abdomen for stool (feces) to leave the body through an ostomy (stoma). Part of the large intestine is attached to the stoma. A bag, also called a pouch, is fitted over the stoma. Stool and gas will collect in the bag. After surgery, you will need to empty and change your colostomy bag as needed. You will also need to care for your stoma. How to care for the stoma Your stoma should look pink, red, and moist, like the inside of your cheek. Soon after surgery, the stoma may be swollen, but this swelling will go away within 6 weeks. To care for the stoma:  Keep the skin around the stoma clean and dry.  Use a clean, soft washcloth to gently wash the stoma and the skin around it. Clean using a circular motion, and wipe away from the stoma opening, not toward it. ? Use warm water and only use cleansers recommended by your health care provider. ? Rinse the stoma area with plain water. ? Dry the area around the stoma well.  Use stoma powder or ointment on your skin only as told by your health care provider. Do not use any other powders, gels, wipes, or creams on the skin around the stoma.  Check the stoma area every day for signs of infection. Check for: ? New or worsening redness, swelling, or pain. ? New or increased fluid or blood. ? Pus or warmth.  Measure the stoma opening regularly and record the size. Watch for changes. (It is normal for the stoma to get smaller as swelling goes away.) Share this information with your health care provider. How to empty the colostomy bag  Empty your bag at bedtime and whenever it is one-third to one-half full. Do not let the bag get more than half-full with stool or gas. The bag could leak if it gets too full. Some colostomy bags have a built-in gas release valve that releases gas often throughout the day. Follow these basic steps: 1. Wash your hands with soap and water. 2. Sit far back  on the toilet seat. 3. Put several pieces of toilet paper into the toilet water. This will prevent splashing as you empty stool into the toilet. 4. Remove the clip or the hook-and-loop fastener from the tail end of the bag. 5. Unroll the tail, then empty the stool into the toilet. 6. Clean the tail with toilet paper or a moist towelette. 7. Reroll the tail, and close it with the clip or the hook-and-loop fastener. 8. Wash your hands again. How to change the colostomy bag Change your bag every 3-4 days or as often as told by your health care provider. Also change the bag if it is leaking or separating from the skin, or if your skin around the stoma looks or feels irritated. Irritated skin may be a sign that the bag is leaking. Always have colostomy supplies with you, and follow these basic steps: 1. Wash your hands with soap and water. Have paper towels or tissues nearby to clean any discharge. 2. Remove the old bag and skin barrier. Use your fingers or a warm cloth to gently push the skin away from the barrier. 3. Clean the  stoma area with water or with mild soap and water, as directed. Use water to rinse away any soap. 4. Dry the skin. You may use the cool setting on a hair dryer to do this. 5. Use a tracing pattern (template) to cut the skin barrier to the size needed. 6. If you are using a two-piece bag, attach the bag and the skin barrier to each other. Add the barrier ring, if you use one. 7. If directed, apply stoma powder or skin barrier gel to the skin. 8. Warm the skin barrier with your hands, or blow with a hair dryer for 5-10 seconds. 9. Remove the paper from the adhesive strip of the skin barrier. 10. Press the adhesive strip onto the skin around the stoma. 11. Gently rub the skin barrier onto the skin. This creates heat that helps the barrier to stick. 12. Apply stoma tape to the edges of the skin barrier, if desired. 12. Wash your hands again. General recommendations  Avoid  wearing tight clothes or having anything press directly on your stoma or bag. Change your clothing whenever it is soiled or damp.  You may shower or bathe with the bag on or off. Do not use harsh or oily soaps or lotions. Dry the skin and bag after bathing.  Store all supplies in a cool, dry place. Do not leave supplies in extreme heat because some parts can melt or not stick as well.  Whenever you leave home, take extra clothing and an extra skin barrier and bag with you.  If your bag gets wet, you can dry it with a hair dryer on the cool setting.  To prevent odor, you may put drops of ostomy deodorizer in the bag.  If recommended by your health care provider, put ostomy lubricant inside the bag. This helps stool to slide out of the bag more easily and completely. Contact a health care provider if:  You have new or worsening redness, swelling, or pain around your stoma.  You have new or increased fluid or blood coming from your stoma.  Your stoma feels warm to the touch.  You have pus coming from your stoma.  Your stoma extends in or out farther than normal.  You need to change your bag every day.  You have a fever. Get help right away if:  Your stool is bloody.  You have nausea or you vomit.  You have trouble breathing. Summary  Measure your stoma opening regularly and record the size. Watch for changes.  Empty your bag at bedtime and whenever it is one-third to one-half full. Do not let the bag get more than half-full with stool or gas.  Change your bag every 3-4 days or as often as told by your health care provider.  Whenever you leave home, take extra clothing and an extra skin barrier and bag with you. This information is not intended to replace advice given to you by your health care provider. Make sure you discuss any questions you have with your health care provider. Document Revised: 03/13/2019 Document Reviewed: 05/17/2017 Elsevier Patient Education  [place].

## 2020-01-11 ENCOUNTER — Emergency Department (HOSPITAL_COMMUNITY)
Admission: EM | Admit: 2020-01-11 | Discharge: 2020-01-12 | Disposition: A | Discharge status: Home / Self Care | Payer: Medicare Other | Attending: Emergency Medicine | Admitting: Emergency Medicine

## 2020-01-11 ENCOUNTER — Encounter (HOSPITAL_COMMUNITY): Payer: Self-pay | Admitting: Emergency Medicine

## 2020-01-11 ENCOUNTER — Emergency Department (HOSPITAL_BASED_OUTPATIENT_CLINIC_OR_DEPARTMENT_OTHER): Payer: Medicare Other

## 2020-01-11 DIAGNOSIS — Z8616 Personal history of covid-19: Secondary | ICD-10-CM | POA: Diagnosis not present

## 2020-01-11 DIAGNOSIS — Z9889 Other specified postprocedural states: Secondary | ICD-10-CM | POA: Insufficient documentation

## 2020-01-11 DIAGNOSIS — I959 Hypotension, unspecified: Secondary | ICD-10-CM | POA: Diagnosis not present

## 2020-01-11 DIAGNOSIS — Z4801 Encounter for change or removal of surgical wound dressing: Secondary | ICD-10-CM | POA: Diagnosis not present

## 2020-01-11 DIAGNOSIS — I10 Essential (primary) hypertension: Secondary | ICD-10-CM | POA: Diagnosis not present

## 2020-01-11 DIAGNOSIS — N400 Enlarged prostate without lower urinary tract symptoms: Secondary | ICD-10-CM | POA: Insufficient documentation

## 2020-01-11 DIAGNOSIS — Z7901 Long term (current) use of anticoagulants: Secondary | ICD-10-CM | POA: Insufficient documentation

## 2020-01-11 DIAGNOSIS — Z79899 Other long term (current) drug therapy: Secondary | ICD-10-CM | POA: Insufficient documentation

## 2020-01-11 DIAGNOSIS — Z5189 Encounter for other specified aftercare: Secondary | ICD-10-CM

## 2020-01-11 DIAGNOSIS — R2232 Localized swelling, mass and lump, left upper limb: Secondary | ICD-10-CM | POA: Diagnosis not present

## 2020-01-11 DIAGNOSIS — K5720 Diverticulitis of large intestine with perforation and abscess without bleeding: Secondary | ICD-10-CM | POA: Diagnosis not present

## 2020-01-11 DIAGNOSIS — I4819 Other persistent atrial fibrillation: Secondary | ICD-10-CM | POA: Insufficient documentation

## 2020-01-11 DIAGNOSIS — M7989 Other specified soft tissue disorders: Secondary | ICD-10-CM

## 2020-01-11 DIAGNOSIS — Z933 Colostomy status: Secondary | ICD-10-CM | POA: Insufficient documentation

## 2020-01-11 DIAGNOSIS — R0902 Hypoxemia: Secondary | ICD-10-CM | POA: Diagnosis not present

## 2020-01-11 DIAGNOSIS — R600 Localized edema: Secondary | ICD-10-CM | POA: Diagnosis not present

## 2020-01-11 DIAGNOSIS — T8189XD Other complications of procedures, not elsewhere classified, subsequent encounter: Secondary | ICD-10-CM | POA: Diagnosis not present

## 2020-01-11 LAB — COMPREHENSIVE METABOLIC PANEL
ALT: 27 U/L (ref 0–44)
AST: 31 U/L (ref 15–41)
Albumin: 2.3 g/dL — ABNORMAL LOW (ref 3.5–5.0)
Alkaline Phosphatase: 128 U/L — ABNORMAL HIGH (ref 38–126)
Anion gap: 9 (ref 5–15)
BUN: 16 mg/dL (ref 8–23)
CO2: 26 mmol/L (ref 22–32)
Calcium: 8.3 mg/dL — ABNORMAL LOW (ref 8.9–10.3)
Chloride: 98 mmol/L (ref 98–111)
Creatinine, Ser: 0.67 mg/dL (ref 0.61–1.24)
GFR calc Af Amer: >60 mL/min (ref >60)
GFR calc non Af Amer: >60 mL/min (ref >60)
Glucose, Bld: 106 mg/dL — ABNORMAL HIGH (ref 70–99)
Potassium: 3.9 mmol/L (ref 3.5–5.1)
Sodium: 133 mmol/L — ABNORMAL LOW (ref 135–145)
Total Bilirubin: 0.9 mg/dL (ref 0.3–1.2)
Total Protein: 4.7 g/dL — ABNORMAL LOW (ref 6.5–8.1)

## 2020-01-11 LAB — CBC WITH DIFFERENTIAL/PLATELET
Abs Immature Granulocytes: 0.61 10*3/uL — ABNORMAL HIGH (ref 0.00–0.07)
Basophils Absolute: 0.1 10*3/uL (ref 0.0–0.1)
Basophils Relative: 1 %
Eosinophils Absolute: 0.2 10*3/uL (ref 0.0–0.5)
Eosinophils Relative: 2 %
HCT: 40.2 % (ref 39.0–52.0)
Hemoglobin: 13.9 g/dL (ref 13.0–17.0)
Immature Granulocytes: 4 %
Lymphocytes Relative: 5 %
Lymphs Abs: 0.8 10*3/uL (ref 0.7–4.0)
MCH: 30.4 pg (ref 26.0–34.0)
MCHC: 34.6 g/dL (ref 30.0–36.0)
MCV: 88 fL (ref 80.0–100.0)
Monocytes Absolute: 0.9 10*3/uL (ref 0.1–1.0)
Monocytes Relative: 6 %
Neutro Abs: 12.3 10*3/uL — ABNORMAL HIGH (ref 1.7–7.7)
Neutrophils Relative %: 82 %
Platelets: 453 10*3/uL — ABNORMAL HIGH (ref 150–400)
RBC: 4.57 MIL/uL (ref 4.22–5.81)
RDW: 14.4 % (ref 11.5–15.5)
WBC: 14.8 10*3/uL — ABNORMAL HIGH (ref 4.0–10.5)
nRBC: 0 % (ref 0.0–0.2)

## 2020-01-11 LAB — CULTURE, BLOOD (ROUTINE X 2)
Culture: NO GROWTH
Culture: NO GROWTH
Special Requests: ADEQUATE
Special Requests: ADEQUATE

## 2020-01-11 MED ORDER — ASCORBIC ACID 500 MG PO TABS
1000.0000 mg | ORAL_TABLET | Freq: Every day | ORAL | Status: DC
  Administered 2020-01-11: 1000 mg via ORAL
  Filled 2020-01-11: qty 2

## 2020-01-11 MED ORDER — FLUTICASONE PROPIONATE 50 MCG/ACT NA SUSP
2.0000 | Freq: Every day | NASAL | Status: DC
  Filled 2020-01-11: qty 16

## 2020-01-11 MED ORDER — MAGNESIUM OXIDE 400 (241.3 MG) MG PO TABS
400.0000 mg | ORAL_TABLET | Freq: Every day | ORAL | Status: DC
  Administered 2020-01-11: 400 mg via ORAL
  Filled 2020-01-11: qty 1

## 2020-01-11 MED ORDER — APIXABAN 5 MG PO TABS
5.0000 mg | ORAL_TABLET | Freq: Two times a day (BID) | ORAL | Status: DC
  Administered 2020-01-11 (×2): 5 mg via ORAL
  Filled 2020-01-11 (×3): qty 1

## 2020-01-11 MED ORDER — MIRTAZAPINE 30 MG PO TABS
30.0000 mg | ORAL_TABLET | Freq: Every day | ORAL | Status: DC
  Administered 2020-01-11: 22:00:00 30 mg via ORAL
  Filled 2020-01-11: qty 1

## 2020-01-11 MED ORDER — ALPRAZOLAM 0.25 MG PO TABS
0.5000 mg | ORAL_TABLET | Freq: Every evening | ORAL | Status: DC | PRN
  Administered 2020-01-11: 0.5 mg via ORAL
  Filled 2020-01-11 (×2): qty 2

## 2020-01-11 MED ORDER — LEVOTHYROXINE SODIUM 50 MCG PO TABS
50.0000 ug | ORAL_TABLET | Freq: Every day | ORAL | Status: DC
  Administered 2020-01-12: 08:00:00 50 ug via ORAL
  Filled 2020-01-11: qty 1

## 2020-01-11 MED ORDER — CALCIUM CARBONATE 1250 (500 CA) MG PO TABS
1.0000 | ORAL_TABLET | Freq: Every day | ORAL | Status: DC
  Administered 2020-01-12: 500 mg via ORAL
  Filled 2020-01-11: qty 1

## 2020-01-11 MED ORDER — BACID PO TABS
1.0000 | ORAL_TABLET | Freq: Every day | ORAL | Status: DC
  Administered 2020-01-11: 16:00:00 1 via ORAL
  Filled 2020-01-11 (×2): qty 1

## 2020-01-11 MED ORDER — POTASSIUM CHLORIDE CRYS ER 20 MEQ PO TBCR
20.0000 meq | EXTENDED_RELEASE_TABLET | Freq: Every morning | ORAL | Status: DC
  Administered 2020-01-11: 20 meq via ORAL
  Filled 2020-01-11: qty 1

## 2020-01-11 MED ORDER — FINASTERIDE 5 MG PO TABS
5.0000 mg | ORAL_TABLET | Freq: Every day | ORAL | Status: DC
  Administered 2020-01-11: 5 mg via ORAL
  Filled 2020-01-11 (×2): qty 1

## 2020-01-11 MED ORDER — HYDROCODONE-ACETAMINOPHEN 5-325 MG PO TABS
1.0000 | ORAL_TABLET | Freq: Three times a day (TID) | ORAL | Status: DC | PRN
  Administered 2020-01-11 (×2): 1 via ORAL
  Filled 2020-01-11 (×2): qty 1

## 2020-01-11 MED ORDER — VITAMIN E 180 MG (400 UNIT) PO CAPS
400.0000 [IU] | ORAL_CAPSULE | Freq: Every day | ORAL | Status: DC
  Administered 2020-01-11: 16:00:00 400 [IU] via ORAL
  Filled 2020-01-11 (×2): qty 1

## 2020-01-11 MED ORDER — THIAMINE HCL 100 MG PO TABS
100.0000 mg | ORAL_TABLET | Freq: Every day | ORAL | Status: DC
  Administered 2020-01-11: 100 mg via ORAL
  Filled 2020-01-11: qty 1

## 2020-01-11 MED ORDER — DILTIAZEM HCL ER COATED BEADS 180 MG PO CP24
180.0000 mg | ORAL_CAPSULE | Freq: Every day | ORAL | Status: DC
  Administered 2020-01-11: 180 mg via ORAL
  Filled 2020-01-11 (×2): qty 1

## 2020-01-11 MED ORDER — DEMECLOCYCLINE HCL 150 MG PO TABS
150.0000 mg | ORAL_TABLET | Freq: Two times a day (BID) | ORAL | Status: DC
  Administered 2020-01-11 (×2): 150 mg via ORAL
  Filled 2020-01-11 (×3): qty 1

## 2020-01-11 MED ORDER — ALUM & MAG HYDROXIDE-SIMETH 200-200-20 MG/5ML PO SUSP
15.0000 mL | Freq: Once | ORAL | Status: AC
  Administered 2020-01-11: 15 mL via ORAL
  Filled 2020-01-11: qty 30

## 2020-01-11 MED ORDER — TAMSULOSIN HCL 0.4 MG PO CAPS
0.4000 mg | ORAL_CAPSULE | Freq: Two times a day (BID) | ORAL | Status: DC
  Administered 2020-01-11 (×2): 0.4 mg via ORAL
  Filled 2020-01-11 (×2): qty 1

## 2020-01-11 NOTE — Progress Notes (Signed)
Patient walker, notebook, glasses and wallet returned to patient in the ED.

## 2020-01-11 NOTE — ED Notes (Signed)
Materials contacts, colostomy bags to send ome with pt requested.

## 2020-01-11 NOTE — TOC Initial Note (Signed)
Transition of Care (TOC) - Initial/Assessment Note    Patient Details  Name: Anthony Curry. MRN: [id number] Date of Birth: Sep 26, 1941  Transition of Care [facility]) CM/SW Contact:    Erenest Rasher, RN Phone Number: [phone] 01/11/2020, 1:08 PM  Clinical Narrative:                 TOC CM spoke to pt and gave permission to speak to dtr, [name]. Spoke to dtr, [name] and states she does not want him to go back to [facility]. She was very unhappy with his care at the SNF. States he has a Dispensing optician that has been lost, she contacted the floor 5W to locate. Offered choice for [facility]. Agreeable to [facility] for [place] ED provider for [facility] orders. Will need HHPT, OT, RN, aide and SW with F2F. States her brother will be here on Monday to assist also. She is there in the home to assist pt with care in the home.   Paoli Tommi Rumps and they can do a start of care for Monday.   Expected Discharge Plan: [place] Barriers to Discharge: Continued Medical Work up   Patient Goals and CMS Choice Patient states their goals for this hospitalization and ongoing recovery are:: patient and daughter wants him to come home CMS Medicare.gov Compare Post Acute Care list provided to:: Patient Represenative (must comment)([name] Ganim) Choice offered to / list presented to : Adult Children  Expected Discharge Plan and Services Expected Discharge Plan: [place] In-house Referral: Clinical Social Work Discharge Planning Services: CM Consult Post Acute Care Choice: [place] arrangements for the past 2 months: [place]: RN, PT, Nurse's Aide, OT, Social Work   Date [place]: 01/11/20 Time [place]: [id number] Representative spoke with at [place]: Adela Lank  Prior Living Arrangements/Services Living arrangements for the past 2 months: [place] with:: Spouse Patient language and need for interpreter reviewed:: Yes Do you feel safe going back to the place where you live?: Yes      Need for Family Participation in Patient Care: Yes (Comment) Care giver support system in place?: Yes (comment) Current home services: DME(rolling walker) Criminal Activity/Legal Involvement Pertinent to Current Situation/Hospitalization: No - Comment as needed  Activities of Daily Living      Permission Sought/Granted Permission sought to share information with : Case Manager, Family Supports, PCP Permission granted to share information with : Yes, Verbal Permission Granted  Share Information with NAME: [name]  Permission granted to share info w AGENCY: [place] granted to share info w Relationship: daughter  Permission granted to share info w Contact Information: [phone]  Emotional Assessment   Attitude/Demeanor/Rapport: Guarded Affect (typically observed): Accepting Orientation: : Oriented to Self, Oriented to Place, Oriented to  Time, Oriented to Situation   Psych Involvement: No (comment)  Admission diagnosis:  Surgical site issues Patient Active Problem List   Diagnosis Date Noted  . Diverticulitis 01/02/2020  . Acute diverticulitis 01/02/2020  . Ataxia 06/30/2019  . 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 Generalized weakness 06/28/2019  . Lactic acidosis 06/28/2019  . Abnormal urinalysis 06/28/2019  . Hypertension 12/07/2016  . 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  Acute urinary retention 09/09/2015  . Choledocholithiasis with cholecystitis 09/08/2015  . Common bile duct stone   . Encounter for antineoplastic immunotherapy 06/28/2015  . Atrial fibrillation ([place]) 06/11/2015  . Gynecomastia, male 06/10/2015  . Dizziness 05/11/2015  . Malignant neoplasm of lower lobe of right lung ([place]) 09/17/2014  . Neutropenic fever ([place]) 09/02/2014  . Neoplastic  malignant related fatigue 08/13/2014  . Physical deconditioning 08/13/2014  . Hypoalbuminemia 08/13/2014  . Diarrhea 08/13/2014  . Antineoplastic chemotherapy induced anemia(285.3) 08/13/2014  . Atrial fibrillation with RVR ([place]) 07/28/2014  . HCAP (healthcare-associated pneumonia) 07/25/2014  . Alcoholism in remission ([place]) 07/25/2014  . Atrial flutter by electrocardiogram ([place]) 07/25/2014  . Swelling of limb 06/10/2014  . Lung cancer ([place]) 10/15/2013  . Hypotension 09/10/2013  . Adrenal mass ([place]) 09/09/2013  . 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 Pulmonary nodules 08/19/2013  . Anemia of chronic disease 07/03/2013  . BPH (benign prostatic hyperplasia) 07/03/2013  . GERD (gastroesophageal reflux disease) 07/03/2013  . Fall 07/03/2013  . Alcohol intoxication ([place]) 07/03/2013  . 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 Alcohol abuse 10/15/2011  . Hyponatremia 10/15/2011   PCP:  [name], MD Pharmacy:   [facility] [place], [address] [place] [address] Phone: [phone] Fax: [phone]     Social Determinants of Health (SDOH) Interventions    Readmission Risk Interventions Readmission Risk Prevention Plan 01/06/2020  Transportation Screening Complete  Medication Review (RN Care Manager) Complete  Some recent data might be hidden

## 2020-01-11 NOTE — Progress Notes (Signed)
CSW reached out to [facility] to discuss patient's care and RN's report the facility will not be able to provide care for his surgical wound. CSW left v/m with admission staff to follow-up. CSW will inform team when he clarifies status of patient at the facility.

## 2020-01-11 NOTE — ED Notes (Signed)
Educational video demonstrating wound, colostomy, and foley care sent to daughter. Teachback method used while educating pt about self-care as possible with these tasks, pt is engaged and asked questions throughout. Pt requests full colostomy be changed at this time, area cleansed and new bag system placed. Care handoff given to next RN at this time, relayed need for walker and supplies to go with pt in AM.

## 2020-01-11 NOTE — Discharge Instructions (Addendum)
Anthony Curry will need his abdominal wound dressing changed twice daily with wet to dry dressings.    Please provide hospital discharge instructions from 2/5 at his time of discharge for additional information for family.

## 2020-01-11 NOTE — ED Notes (Signed)
Meal tray ordered 

## 2020-01-11 NOTE — ED Notes (Signed)
Dinner tray ordered.

## 2020-01-11 NOTE — ED Triage Notes (Addendum)
Patient brought in by [facility] from [place] for evaluation of wound and possibly placement. Earlier this week patient had an abdominal surgery due to diverticulitis that resulted in a colostomy bag. Shortly after surgery patient states he developed a blood clot in his left arm and he received an embolectomy. Patient is here today because per EMS and patient, colostomy bag had become very full and when it was being changed stool was spilled into the wound from his surgery. Patient is alert and oriented at this time, GCS 15. Received a percocet for pain prior to EMS arrival to rehab facility.  EMS reports that nurse at facility stated that patient was not appropriate for their facility because of the open wound from his abdominal surgery.  Patient had positive COVID swab 9 days ago.

## 2020-01-11 NOTE — Progress Notes (Signed)
VASCULAR LAB PRELIMINARY  PRELIMINARY  PRELIMINARY  PRELIMINARY  Left upper extremity venous duplex completed.    Preliminary report:  See CV proc for preliminary results.  Gave report to Dr. Roni Bread, [facility], RVT 01/11/2020, 6:24 PM

## 2020-01-11 NOTE — ED Provider Notes (Signed)
[facility] EMERGENCY DEPARTMENT Provider Note   CSN: [id number] Arrival date & time: 01/11/20  0900     History Chief Complaint  Patient presents with  . Wound Check    Anthony Curry. is a 79 y.o. male.  The history is provided by the patient and medical records. No language interpreter was used.  Wound Check   Anthony Curry. is a 79 y.o. male who presents to the Emergency Department complaining of wound check.  He presents to the ED from [facility] for wound check.  He was discharged from the hospital yesterday to [facility] following hospitalization for COVID19 infection, perforated diverticulitis with colostomy placement as well as embolectomy of LUE thrombosis.  He states that he was discharged to the facility yesterday. He states that they were having problems with his colostomy bag feeling and getting it emptied. It was emptied three times since he arrived last night. He states that one point in time they had difficulty draining the bag and he the tech needed assistance. It took a while for nursing to arrive. There was concern that colostomy fluid got into his abdominal wound. He denies any fevers, abdominal pain, nausea, vomiting. He states that he is disappointed but otherwise has no acute complaints. Discussed [facility]. There is a concern that his incision is more open appearing than when he left the facility and they are having trouble with drainage from around the colostomy site that is getting into the wound and there are concern for potential for infection.      Past Medical History:  Diagnosis Date  . 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 Adrenal mass ([place])   . Alcoholism in remission ([place])   . Anemia of chronic disease   . 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 Anxiety   . BPH (benign prostatic hyperplasia)   . Cancer ([place])    small cell/lung  . Complication of anesthesia 09/08/2015    JITTERY AFTER  GALLBLADDER SURGERY  . GERD (gastroesophageal reflux disease)   . History of radiation therapy 11/14/16, 11/18/16, 11/22/16   Left upper lung: 54 Gy in 3 fractions  . Hypertension   . Neoplastic malignant related fatigue    IV tx. every 2 weeks last9-14-16 (Dr. Earlie Server), radiation last tx. 6 months  . 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 Pericardial effusion   . Persistent atrial fibrillation ([place])   . Pulmonary nodules   . Radiation 12/02/14-12/16/14   Left adrenal gland 30 Gy in 10 fractions    Patient Active Problem List   Diagnosis Date Noted  . Diverticulitis 01/02/2020  . Acute diverticulitis 01/02/2020  . Ataxia 06/30/2019  . 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 Generalized weakness 06/28/2019  . Lactic acidosis 06/28/2019  . Abnormal urinalysis 06/28/2019  . Hypertension 12/07/2016  . 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 Acute urinary retention 09/09/2015  . Choledocholithiasis with cholecystitis 09/08/2015  . Common bile duct stone   . Encounter for antineoplastic immunotherapy 06/28/2015  . Atrial fibrillation ([place]) 06/11/2015  . Gynecomastia, male 06/10/2015  . Dizziness 05/11/2015  . Malignant neoplasm of lower lobe of right lung ([place]) 09/17/2014  . Neutropenic fever ([place]) 09/02/2014  . Neoplastic malignant related fatigue 08/13/2014  . Physical deconditioning 08/13/2014  . Hypoalbuminemia 08/13/2014  . Diarrhea 08/13/2014  . Antineoplastic chemotherapy induced anemia(285.3) 08/13/2014  . Atrial fibrillation with RVR ([place]) 07/28/2014  . HCAP (healthcare-associated pneumonia) 07/25/2014  .  Alcoholism in remission ([place]) 07/25/2014  . Atrial flutter by electrocardiogram ([place]) 07/25/2014  . Swelling of limb 06/10/2014  . Lung cancer ([place]) 10/15/2013  . Hypotension 09/10/2013  . Adrenal mass ([place]) 09/09/2013  . 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 Pulmonary nodules 08/19/2013  . Anemia of chronic disease  07/03/2013  . BPH (benign prostatic hyperplasia) 07/03/2013  . GERD (gastroesophageal reflux disease) 07/03/2013  . Fall 07/03/2013  . Alcohol intoxication ([place]) 07/03/2013  . 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 Alcohol abuse 10/15/2011  . Hyponatremia 10/15/2011    Past Surgical History:  Procedure Laterality Date  . ANKLE ARTHRODESIS  1995   rt fx-hardware in  . CARDIOVERSION N/A 09/29/2014   Procedure: CARDIOVERSION;  Surgeon: Josue Hector, MD;  Location: [facility] ENDOSCOPY;  Service: Cardiovascular;  Laterality: N/A;  . CHOLECYSTECTOMY  09/08/2015    laproscopic   . CHOLECYSTECTOMY N/A 09/08/2015   Procedure: LAPAROSCOPIC CHOLECYSTECTOMY WITH ATTEMPTED INTRAOPERATIVE CHOLANGIOGRAM ;  Surgeon: Fanny Skates, MD;  Location: [facility];  Service: General;  Laterality: N/A;  . COLONOSCOPY    . EMBOLECTOMY Left 01/06/2020   Procedure: Embolectomy left axillary, brachial, radial, and ulnar artery;  Surgeon: Elam Dutch, MD;  Location: [facility] OR;  Service: Vascular;  Laterality: Left;  . ERCP N/A 09/02/2015   Procedure: ENDOSCOPIC RETROGRADE CHOLANGIOPANCREATOGRAPHY (ERCP);  Surgeon: Arta Silence, MD;  Location: [facility] ENDOSCOPY;  Service: Endoscopy;  Laterality: N/A;  . ERCP N/A 09/07/2015   Procedure: ENDOSCOPIC RETROGRADE CHOLANGIOPANCREATOGRAPHY (ERCP);  Surgeon: Clarene Essex, MD;  Location: [facility] ENDOSCOPY;  Service: Endoscopy;  Laterality: N/A;  . EXCISION / CURETTAGE BONE CYST FINGER  2005   rt thumb  . HEMORRHOID SURGERY    . LAPAROTOMY N/A 01/05/2020   Procedure: EXPLORATORY LAPAROTOMY sigmoid colon resection creation end colostomy for perforated diverticulitis and purulent peritonitis;  Surgeon: Kieth Brightly Arta Bruce, MD;  Location: [facility] ORS;  Service: General;  Laterality: N/A;  . Percutaneous Cholecytostomy tube     IVR insertion 06-13-15(Dr. Vernard Gambles)- 08-28-15 remains in place to drainage bag  . TONSILLECTOMY    . TOTAL SHOULDER ARTHROPLASTY  02/03/2012   Procedure: TOTAL SHOULDER  ARTHROPLASTY;  Surgeon: [name], MD;  Location: [facility];  Service: Orthopedics;  Laterality: Left;       Family History  Problem Relation Age of Onset  . Lung cancer Mother   . Hypertension Father   . Heart attack Father   . Diabetes Paternal Grandmother   . Brain cancer Brother   . Heart attack Brother   . Neuropathy Neg Hx     Social History   Tobacco Use  . Smoking status: Former Smoker    Packs/day: 2.00    Years: 30.00    Pack years: 60.00    Types: Cigarettes    Quit date: 12/05/1990    Years since quitting: 29.1  . Smokeless tobacco: Never Used  Substance Use Topics  . Alcohol use: Yes    Alcohol/week: 0.0 standard drinks    Comment: past hx.ETOH abuse,08-31-15 "occ. wine ,drinks non alcoholic beer occ."  . Drug use: No    Home Medications Prior to Admission medications   Medication Sig Start Date End Date Taking? Authorizing Provider  ALPRAZolam Duanne Moron) 0.5 MG tablet Take 1 tablet (0.5 mg total) by mouth at bedtime as needed. 01/10/20  Yes Ghimire, Henreitta Leber, MD  apixaban (ELIQUIS) 5 MG TABS tablet Take 5 mg by mouth 2 (two) times daily.   Yes [provider]  Ascorbic Acid (  VITAMIN C PO) Take 1,000 mg by mouth daily.    Yes [provider]  B Complex-C (SUPER B COMPLEX PO) Take 1 each by mouth daily.    Yes [provider]  calcium carbonate (OS-CAL) 600 MG TABS Take 600 mg by mouth daily with breakfast.   Yes [provider]  demeclocycline (DECLOMYCIN) 150 MG tablet Take 2 tablets twice a day Patient taking differently: Take 300 mg by mouth 2 (two) times daily.  07/09/19  Yes Heilingoetter, Cassandra L, PA-C  diltiazem (CARDIZEM CD) 180 MG 24 hr capsule Take 1 capsule (180 mg total) by mouth daily at 12 noon. 01/09/15  Yes Sherran Needs, NP  finasteride (PROSCAR) 5 MG tablet Take 1 tablet (5 mg total) by mouth daily. 01/10/20  Yes Ghimire, Henreitta Leber, MD  fluticasone (FLONASE) 50 MCG/ACT nasal spray Place 2  sprays into both nostrils at bedtime.  10/17/19  Yes [provider]  HYDROcodone-acetaminophen (NORCO/VICODIN) 5-325 MG tablet Take 1 tablet by mouth 3 (three) times daily as needed for moderate pain. 01/10/20  Yes Ghimire, Shanker M, MD  KLOR-CON M20 20 MEQ tablet Take 20 mEq by mouth every morning.  10/18/14  Yes [provider]  Lactobacillus (ACIDOPHILUS) CAPS capsule Take 1 capsule by mouth daily.   Yes [provider]  levothyroxine (SYNTHROID, LEVOTHROID) 50 MCG tablet TAKE 1 TABLET (50 MCG TOTAL) BY MOUTH DAILY BEFORE BREAKFAST. 03/24/18  Yes [name], MD  MAGNESIUM PO Take 400 mg by mouth daily.    Yes [provider]  mirtazapine (REMERON) 30 MG tablet TAKE 1 TABLET(30 MG) BY MOUTH AT BEDTIME Patient taking differently: Take 30 mg by mouth at bedtime.  11/28/19  Yes [name], MD  Multiple Vitamin (MULTIVITAMIN WITH MINERALS) TABS Take 1 tablet by mouth daily.   Yes [provider]  Omega-3 Fatty Acids (FISH OIL PO) Take 1,000 mg by mouth daily.    Yes [provider]  tamsulosin (FLOMAX) 0.4 MG CAPS capsule Take 1 capsule (0.4 mg total) by mouth 2 (two) times daily. 01/10/20  Yes Ghimire, Henreitta Leber, MD  thiamine (VITAMIN B-1) 100 MG tablet Take 1 tablet (100 mg total) by mouth daily. 09/11/13  Yes Erick Colace, NP  vitamin E 400 UNIT capsule Take 400 Units by mouth daily.   Yes [provider]  Nivolumab (OPDIVO IV) Inject as directed every 30 (thirty) days. Inject as directed every 2 weeks    [provider]  Nutritional Supplements (ENSURE COMPLETE PO) Take 1 Can by mouth 2 (two) times daily.    [provider]    Allergies    Patient has no known allergies.  Review of Systems   Review of Systems  All other systems reviewed and are negative.   Physical Exam Updated Vital Signs BP (!) 162/63   Pulse 82   Temp 98.7 F (37.1 C) (Oral)   Resp 16   SpO2 95%   Physical Exam Vitals  and nursing note reviewed.  Constitutional:      Appearance: He is well-developed.  HENT:     Head: Normocephalic and atraumatic.  Cardiovascular:     Rate and Rhythm: Normal rate and regular rhythm.  Pulmonary:     Effort: Pulmonary effort is normal. No respiratory distress.  Abdominal:     Palpations: Abdomen is soft.     Tenderness: There is no abdominal tenderness. There is no guarding or rebound.     Comments: Colostomy in the left  lower quadrant with watery green drainage in the bag. Midline lower abdominal incision site with packing in place. Packing removed with granulation tissue in the wound base.  Musculoskeletal:        General: No tenderness.  Skin:    General: Skin is warm and dry.  Neurological:     Mental Status: He is alert and oriented to person, place, and time.  Psychiatric:        Behavior: Behavior normal.       ED Results / Procedures / Treatments   Labs (all labs ordered are listed, but only abnormal results are displayed) Labs Reviewed  COMPREHENSIVE METABOLIC PANEL - Abnormal; Notable for the following components:      Result Value   Sodium 133 (*)    Glucose, Bld 106 (*)    Calcium 8.3 (*)    Total Protein 4.7 (*)    Albumin 2.3 (*)    Alkaline Phosphatase 128 (*)    All other components within normal limits  CBC WITH DIFFERENTIAL/PLATELET - Abnormal; Notable for the following components:   WBC 14.8 (*)    Platelets 453 (*)    Neutro Abs 12.3 (*)    Abs Immature Granulocytes 0.61 (*)    All other components within normal limits    EKG None  Radiology No results found.  Procedures Procedures (including critical care time)  Medications Ordered in ED Medications  apixaban (ELIQUIS) tablet 5 mg (5 mg Oral Given 01/11/20 1213)  demeclocycline (DECLOMYCIN) tablet 150 mg (150 mg Oral Given 01/11/20 1213)  diltiazem (CARDIZEM CD) 24 hr capsule 180 mg (180 mg Oral Given 01/11/20 1214)  finasteride (PROSCAR) tablet 5 mg (5 mg Oral Given 01/11/20  1213)  HYDROcodone-acetaminophen (NORCO/VICODIN) 5-325 MG per tablet 1 tablet (has no administration in time range)  tamsulosin (FLOMAX) capsule 0.4 mg (0.4 mg Oral Given 01/11/20 1213)  ALPRAZolam (XANAX) tablet 0.5 mg (has no administration in time range)  ascorbic acid (VITAMIN C) tablet 1,000 mg (has no administration in time range)  calcium carbonate (OS-CAL - dosed in mg of elemental calcium) tablet 500 mg of elemental calcium (has no administration in time range)  fluticasone (FLONASE) 50 MCG/ACT nasal spray 2 spray (has no administration in time range)  potassium chloride SA (KLOR-CON) CR tablet 20 mEq (has no administration in time range)  lactobacillus acidophilus (BACID) tablet 1 tablet (has no administration in time range)  levothyroxine (SYNTHROID) tablet 50 mcg (has no administration in time range)  magnesium oxide (MAG-OX) tablet 400 mg (has no administration in time range)  mirtazapine (REMERON) tablet 30 mg (has no administration in time range)  thiamine tablet 100 mg (has no administration in time range)  vitamin E capsule 400 Units (has no administration in time range)  alum & mag hydroxide-simeth (MAALOX/MYLANTA) 200-200-20 MG/5ML suspension 15 mL (has no administration in time range)    ED Course  I have reviewed the triage vital signs and the nursing notes.  Pertinent labs & imaging results that were available during my care of the patient were reviewed by me and considered in my medical decision making (see chart for details).    MDM Rules/Calculators/A&P                      Patient here for evaluation of abdominal wound from [facility]. Wound without any evidence of infection. Ostomy is in place in the left lower quadrant without drainage of fluids from the area. He also complains of edema  to his left upper arm. There is no evidence of acute cellulitis or infectious process. Discussed with case management. Patient and daughter do not want the patient to go back to  [name]. Plan to discharge home with home health. He will not be able to go home until tomorrow until another family member is available to assist. Discussed outpatient follow-up and return precautions. Final Clinical Impression(s) / ED Diagnoses Final diagnoses:  Visit for wound check  Edema of left upper arm    Rx / DC Orders ED Discharge Orders         [place]     01/11/20 1325    Face-to-face encounter (required for Medicare/Medicaid patients)    Comments: I Quintella Reichert certify that this patient is under my care and that I, or a nurse practitioner or physician's assistant working with me, had a face-to-face encounter that meets the physician face-to-face encounter requirements with this patient on 01/11/2020. The encounter with the patient was in whole, or in part for the following medical condition(s) which is the primary reason for home health care (List medical condition): pt with stage IV lung cancer, diverticulitis s/p colostomy, covid 19   01/11/20 1325           Quintella Reichert, MD 01/11/20 1524

## 2020-01-11 NOTE — Progress Notes (Signed)
Per [facility] admissions the wound care issues with the surgical wound is not their primary concern. Patient's family was requesting patient not to return to facility and had made that request to [facility] of nursing.   CSW spoke with patients wife and daughter and noted they were working with [facility] for home plan. CSW contacted charge nurse on 5W and they located his belongings and are currently sanitizing it and will bring it down to the ED to the patient for discharge.

## 2020-01-11 NOTE — ED Notes (Signed)
Spoke to pt daughter about plan for pt to return tonight, states that her brother will not be in town until tomorrow to help with caring for pt, requests RN to touch base with MD about overnight boarding to allow for appropriate caregiving (daughter is now covid positive). EDP states she will call pt daughter but that plan will likely be for pt to board overnight and go with [facility] inAM. RN to send educational videos to daughter via phone and to provide edu directly to pt.

## 2020-01-12 DIAGNOSIS — R0902 Hypoxemia: Secondary | ICD-10-CM | POA: Diagnosis not present

## 2020-01-12 DIAGNOSIS — U071 COVID-19: Secondary | ICD-10-CM | POA: Diagnosis not present

## 2020-01-12 DIAGNOSIS — R279 Unspecified lack of coordination: Secondary | ICD-10-CM | POA: Diagnosis not present

## 2020-01-12 DIAGNOSIS — Z4801 Encounter for change or removal of surgical wound dressing: Secondary | ICD-10-CM | POA: Diagnosis not present

## 2020-01-12 DIAGNOSIS — Z743 Need for continuous supervision: Secondary | ICD-10-CM | POA: Diagnosis not present

## 2020-01-12 NOTE — ED Notes (Signed)
Patient verbalizes understanding of discharge instructions. Opportunity for questioning and answers were provided. Armband removed by staff, pt discharged from ED.  

## 2020-01-12 NOTE — ED Provider Notes (Signed)
Patient has been boarding in the emergency department due to family not wanting patient to go back to his prior rehabilitation facility, [facility].  See initial providers HPI for more information, in summation 79 year old gentleman recently hospitalized for Covid infection, perforated diverticulitis with colostomy placement as well as embolectomy of LUE thrombosis.  Previously discharged to [facility] however patient and family did not like the care he was being given there and would like to take patient home.  Patient does have home health set up at home.  Previous providers notes wound did not look infected and appeared at baseline.  Home health has been set up for patient.  They were waiting until today until they can have additional family members at home assist the patient.  Per nursing patient will need to be taken home via P[facility].  Unfortunately he does not have any close here in the ED.  His grandson will drop off a set of new close and take patient's belongings home and [facility] will then be called.  Family is at home to receive patient.  Patient appears appropriate, nonseptic on my evaluation.  He has no complaints.  Wound appears similar to providers picture in epic from yesterday.  He was given his home medications while here in the emergency department.  Patient will be discharged home to his family's care.  VS stable throughout ED stay. Family has been given instructions on ostomy site care and strict return precautions to return to the emergency department.  The patient has been appropriately medically screened and/or stabilized in the ED. I have low suspicion for any other emergent medical condition which would require further screening, evaluation or treatment in the ED or require inpatient management.  Patient is hemodynamically stable and in no acute distress.  Patient able to ambulate in department prior to ED.  Evaluation does not show acute pathology that would require ongoing or  additional emergent interventions while in the emergency department or further inpatient treatment.  I have discussed the diagnosis with the patient and answered all questions.  Pain is been managed while in the emergency department and patient has no further complaints prior to discharge.  Patient is comfortable with plan discussed in room and is stable for discharge at this time.  I have discussed strict return precautions for returning to the emergency department.  Patient was encouraged to follow-up with PCP/specialist refer to at discharge.   Chelisa Hennen A, PA-C 01/12/20 1052    Quintella Reichert, MD 01/12/20 [phone]

## 2020-01-12 NOTE — Care Management (Addendum)
ED CM  consult with ED CSW on the updated transitional  plan for patient, patient HH has been arranged with [facility] agency wound care  to be managed at home.

## 2020-01-12 NOTE — ED Notes (Signed)
[place]   Breakfast ordered

## 2020-01-12 NOTE — ED Notes (Signed)
Patient's belongings, including walker given to grandson to take home. Pt is being transported with [facility].

## 2020-01-12 NOTE — ED Notes (Signed)
Lunch Tray Ordered @ 1030. 

## 2020-01-12 NOTE — ED Notes (Signed)
I spoke to pt's daughter [name] regarding coordination of transport. Daughter will send son to drop off clothing for pt, and to pick up pt's walker and belongings prior to being transported with [facility] to home. Awaiting grandson's arrival to ED, and will then contact [facility] for transport.

## 2020-01-13 ENCOUNTER — Telehealth: Payer: Self-pay | Admitting: Medical Oncology

## 2020-01-13 ENCOUNTER — Ambulatory Visit (HOSPITAL_COMMUNITY): Admission: RE | Admit: 2020-01-13 | Payer: Medicare Other | Source: Ambulatory Visit

## 2020-01-13 DIAGNOSIS — I700 Atherosclerosis of aorta: Secondary | ICD-10-CM | POA: Diagnosis not present

## 2020-01-13 DIAGNOSIS — D630 Anemia in neoplastic disease: Secondary | ICD-10-CM | POA: Diagnosis not present

## 2020-01-13 DIAGNOSIS — I82722 Chronic embolism and thrombosis of deep veins of left upper extremity: Secondary | ICD-10-CM | POA: Diagnosis not present

## 2020-01-13 DIAGNOSIS — I480 Paroxysmal atrial fibrillation: Secondary | ICD-10-CM | POA: Diagnosis not present

## 2020-01-13 DIAGNOSIS — Z48815 Encounter for surgical aftercare following surgery on the digestive system: Secondary | ICD-10-CM | POA: Diagnosis not present

## 2020-01-13 DIAGNOSIS — J9601 Acute respiratory failure with hypoxia: Secondary | ICD-10-CM | POA: Diagnosis not present

## 2020-01-13 DIAGNOSIS — J439 Emphysema, unspecified: Secondary | ICD-10-CM | POA: Diagnosis not present

## 2020-01-13 DIAGNOSIS — I313 Pericardial effusion (noninflammatory): Secondary | ICD-10-CM | POA: Diagnosis not present

## 2020-01-13 DIAGNOSIS — Z433 Encounter for attention to colostomy: Secondary | ICD-10-CM | POA: Diagnosis not present

## 2020-01-13 DIAGNOSIS — Z466 Encounter for fitting and adjustment of urinary device: Secondary | ICD-10-CM | POA: Diagnosis not present

## 2020-01-13 DIAGNOSIS — C3431 Malignant neoplasm of lower lobe, right bronchus or lung: Secondary | ICD-10-CM | POA: Diagnosis not present

## 2020-01-13 DIAGNOSIS — K219 Gastro-esophageal reflux disease without esophagitis: Secondary | ICD-10-CM | POA: Diagnosis not present

## 2020-01-13 DIAGNOSIS — F419 Anxiety disorder, unspecified: Secondary | ICD-10-CM | POA: Diagnosis not present

## 2020-01-13 DIAGNOSIS — G620 Drug-induced polyneuropathy: Secondary | ICD-10-CM | POA: Diagnosis not present

## 2020-01-13 DIAGNOSIS — U071 COVID-19: Secondary | ICD-10-CM | POA: Diagnosis not present

## 2020-01-13 DIAGNOSIS — I10 Essential (primary) hypertension: Secondary | ICD-10-CM | POA: Diagnosis not present

## 2020-01-13 DIAGNOSIS — M19012 Primary osteoarthritis, left shoulder: Secondary | ICD-10-CM | POA: Diagnosis not present

## 2020-01-13 DIAGNOSIS — N401 Enlarged prostate with lower urinary tract symptoms: Secondary | ICD-10-CM | POA: Diagnosis not present

## 2020-01-13 DIAGNOSIS — R338 Other retention of urine: Secondary | ICD-10-CM | POA: Diagnosis not present

## 2020-01-13 DIAGNOSIS — E222 Syndrome of inappropriate secretion of antidiuretic hormone: Secondary | ICD-10-CM | POA: Diagnosis not present

## 2020-01-13 DIAGNOSIS — T451X5S Adverse effect of antineoplastic and immunosuppressive drugs, sequela: Secondary | ICD-10-CM | POA: Diagnosis not present

## 2020-01-13 DIAGNOSIS — Z4801 Encounter for change or removal of surgical wound dressing: Secondary | ICD-10-CM | POA: Diagnosis not present

## 2020-01-13 DIAGNOSIS — E039 Hypothyroidism, unspecified: Secondary | ICD-10-CM | POA: Diagnosis not present

## 2020-01-13 DIAGNOSIS — J1282 Pneumonia due to coronavirus disease 2019: Secondary | ICD-10-CM | POA: Diagnosis not present

## 2020-01-13 DIAGNOSIS — F329 Major depressive disorder, single episode, unspecified: Secondary | ICD-10-CM | POA: Diagnosis not present

## 2020-01-13 NOTE — Telephone Encounter (Signed)
Care issue.Pt not longer at [facility] . At home with home health.

## 2020-01-14 DIAGNOSIS — J1282 Pneumonia due to coronavirus disease 2019: Secondary | ICD-10-CM | POA: Diagnosis not present

## 2020-01-14 DIAGNOSIS — U071 COVID-19: Secondary | ICD-10-CM | POA: Diagnosis not present

## 2020-01-14 DIAGNOSIS — Z48815 Encounter for surgical aftercare following surgery on the digestive system: Secondary | ICD-10-CM | POA: Diagnosis not present

## 2020-01-14 DIAGNOSIS — Z4801 Encounter for change or removal of surgical wound dressing: Secondary | ICD-10-CM | POA: Diagnosis not present

## 2020-01-14 DIAGNOSIS — J9601 Acute respiratory failure with hypoxia: Secondary | ICD-10-CM | POA: Diagnosis not present

## 2020-01-14 DIAGNOSIS — Z466 Encounter for fitting and adjustment of urinary device: Secondary | ICD-10-CM | POA: Diagnosis not present

## 2020-01-15 DIAGNOSIS — J9601 Acute respiratory failure with hypoxia: Secondary | ICD-10-CM | POA: Diagnosis not present

## 2020-01-15 DIAGNOSIS — U071 COVID-19: Secondary | ICD-10-CM | POA: Diagnosis not present

## 2020-01-15 DIAGNOSIS — Z466 Encounter for fitting and adjustment of urinary device: Secondary | ICD-10-CM | POA: Diagnosis not present

## 2020-01-15 DIAGNOSIS — Z4801 Encounter for change or removal of surgical wound dressing: Secondary | ICD-10-CM | POA: Diagnosis not present

## 2020-01-15 DIAGNOSIS — Z48815 Encounter for surgical aftercare following surgery on the digestive system: Secondary | ICD-10-CM | POA: Diagnosis not present

## 2020-01-15 DIAGNOSIS — J1282 Pneumonia due to coronavirus disease 2019: Secondary | ICD-10-CM | POA: Diagnosis not present

## 2020-01-16 DIAGNOSIS — R310 Gross hematuria: Secondary | ICD-10-CM | POA: Diagnosis not present

## 2020-01-16 DIAGNOSIS — R338 Other retention of urine: Secondary | ICD-10-CM | POA: Diagnosis not present

## 2020-01-17 DIAGNOSIS — Z4801 Encounter for change or removal of surgical wound dressing: Secondary | ICD-10-CM | POA: Diagnosis not present

## 2020-01-17 DIAGNOSIS — Z48815 Encounter for surgical aftercare following surgery on the digestive system: Secondary | ICD-10-CM | POA: Diagnosis not present

## 2020-01-17 DIAGNOSIS — U071 COVID-19: Secondary | ICD-10-CM | POA: Diagnosis not present

## 2020-01-17 DIAGNOSIS — J9601 Acute respiratory failure with hypoxia: Secondary | ICD-10-CM | POA: Diagnosis not present

## 2020-01-17 DIAGNOSIS — J1282 Pneumonia due to coronavirus disease 2019: Secondary | ICD-10-CM | POA: Diagnosis not present

## 2020-01-17 DIAGNOSIS — Z466 Encounter for fitting and adjustment of urinary device: Secondary | ICD-10-CM | POA: Diagnosis not present

## 2020-01-20 DIAGNOSIS — Z466 Encounter for fitting and adjustment of urinary device: Secondary | ICD-10-CM | POA: Diagnosis not present

## 2020-01-20 DIAGNOSIS — Z48815 Encounter for surgical aftercare following surgery on the digestive system: Secondary | ICD-10-CM | POA: Diagnosis not present

## 2020-01-20 DIAGNOSIS — Z4801 Encounter for change or removal of surgical wound dressing: Secondary | ICD-10-CM | POA: Diagnosis not present

## 2020-01-20 DIAGNOSIS — J1282 Pneumonia due to coronavirus disease 2019: Secondary | ICD-10-CM | POA: Diagnosis not present

## 2020-01-20 DIAGNOSIS — J9601 Acute respiratory failure with hypoxia: Secondary | ICD-10-CM | POA: Diagnosis not present

## 2020-01-20 DIAGNOSIS — U071 COVID-19: Secondary | ICD-10-CM | POA: Diagnosis not present

## 2020-01-22 ENCOUNTER — Emergency Department (HOSPITAL_COMMUNITY)
Admission: EM | Admit: 2020-01-22 | Discharge: 2020-01-22 | Disposition: A | Discharge status: Home / Self Care | Payer: Medicare Other | Attending: Emergency Medicine | Admitting: Emergency Medicine

## 2020-01-22 ENCOUNTER — Other Ambulatory Visit: Payer: Self-pay

## 2020-01-22 DIAGNOSIS — T83098A Other mechanical complication of other indwelling urethral catheter, initial encounter: Secondary | ICD-10-CM | POA: Diagnosis not present

## 2020-01-22 DIAGNOSIS — U071 COVID-19: Secondary | ICD-10-CM | POA: Diagnosis not present

## 2020-01-22 DIAGNOSIS — R0902 Hypoxemia: Secondary | ICD-10-CM | POA: Diagnosis not present

## 2020-01-22 DIAGNOSIS — T839XXA Unspecified complication of genitourinary prosthetic device, implant and graft, initial encounter: Secondary | ICD-10-CM | POA: Insufficient documentation

## 2020-01-22 DIAGNOSIS — Z85118 Personal history of other malignant neoplasm of bronchus and lung: Secondary | ICD-10-CM | POA: Insufficient documentation

## 2020-01-22 DIAGNOSIS — Z4801 Encounter for change or removal of surgical wound dressing: Secondary | ICD-10-CM | POA: Diagnosis not present

## 2020-01-22 DIAGNOSIS — I10 Essential (primary) hypertension: Secondary | ICD-10-CM | POA: Diagnosis not present

## 2020-01-22 DIAGNOSIS — Z7901 Long term (current) use of anticoagulants: Secondary | ICD-10-CM | POA: Diagnosis not present

## 2020-01-22 DIAGNOSIS — R338 Other retention of urine: Secondary | ICD-10-CM | POA: Diagnosis not present

## 2020-01-22 DIAGNOSIS — Y738 Miscellaneous gastroenterology and urology devices associated with adverse incidents, not elsewhere classified: Secondary | ICD-10-CM | POA: Diagnosis not present

## 2020-01-22 DIAGNOSIS — J1282 Pneumonia due to coronavirus disease 2019: Secondary | ICD-10-CM | POA: Diagnosis not present

## 2020-01-22 DIAGNOSIS — Z466 Encounter for fitting and adjustment of urinary device: Secondary | ICD-10-CM | POA: Diagnosis not present

## 2020-01-22 DIAGNOSIS — Z79899 Other long term (current) drug therapy: Secondary | ICD-10-CM | POA: Insufficient documentation

## 2020-01-22 DIAGNOSIS — J9601 Acute respiratory failure with hypoxia: Secondary | ICD-10-CM | POA: Diagnosis not present

## 2020-01-22 DIAGNOSIS — Z87891 Personal history of nicotine dependence: Secondary | ICD-10-CM | POA: Insufficient documentation

## 2020-01-22 DIAGNOSIS — Z48815 Encounter for surgical aftercare following surgery on the digestive system: Secondary | ICD-10-CM | POA: Diagnosis not present

## 2020-01-22 NOTE — ED Notes (Signed)
Bladder scan showed 0ml.  

## 2020-01-22 NOTE — ED Provider Notes (Signed)
[facility] DEPT Provider Note   CSN: [id number] Arrival date & time: 01/22/20  2222     History No chief complaint on file.   Tahj Njoku. is a 79 y.o. male.  HPI    Patient presents with concern of nondraining Foley catheter. Patient has multiple medical issues including recent hospitalization for Covid, perforated diverticulitis.  He notes that earlier today he had exchange of a Foley catheter, and since that time has had no urine in his collection bag.  He states that after having the exchange performed he was informed to present to the emergency department should he not drained any urine. He denies other complaints including abdominal pain, lightheadedness, syncope, chest pain, fever.  He notes no concerns with his colostomy bag. Past Medical History:  Diagnosis Date  . 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 Adrenal mass ([place])   . Alcoholism in remission ([place])   . Anemia of chronic disease   . 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 Anxiety   . BPH (benign prostatic hyperplasia)   . Cancer ([place])    small cell/lung  . Complication of anesthesia 09/08/2015    JITTERY AFTER GALLBLADDER SURGERY  . GERD (gastroesophageal reflux disease)   . History of radiation therapy 11/14/16, 11/18/16, 11/22/16   Left upper lung: 54 Gy in 3 fractions  . Hypertension   . Neoplastic malignant related fatigue    IV tx. every 2 weeks last9-14-16 (Dr. Earlie Server), radiation last tx. 6 months  . 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 Pericardial effusion   . Persistent atrial fibrillation ([place])   . Pulmonary nodules   . Radiation 12/02/14-12/16/14   Left adrenal gland 30 Gy in 10 fractions    Patient Active Problem List   Diagnosis Date Noted  . Diverticulitis 01/02/2020  . Acute diverticulitis 01/02/2020  . Ataxia 06/30/2019  . 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 Generalized weakness 06/28/2019  . Lactic  acidosis 06/28/2019  . Abnormal urinalysis 06/28/2019  . Hypertension 12/07/2016  . 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 Acute urinary retention 09/09/2015  . Choledocholithiasis with cholecystitis 09/08/2015  . Common bile duct stone   . Encounter for antineoplastic immunotherapy 06/28/2015  . Atrial fibrillation ([place]) 06/11/2015  . Gynecomastia, male 06/10/2015  . Dizziness 05/11/2015  . Malignant neoplasm of lower lobe of right lung ([place]) 09/17/2014  . Neutropenic fever ([place]) 09/02/2014  . Neoplastic malignant related fatigue 08/13/2014  . Physical deconditioning 08/13/2014  . Hypoalbuminemia 08/13/2014  . Diarrhea 08/13/2014  . Antineoplastic chemotherapy induced anemia(285.3) 08/13/2014  . Atrial fibrillation with RVR ([place]) 07/28/2014  . HCAP (healthcare-associated pneumonia) 07/25/2014  . Alcoholism in remission ([place]) 07/25/2014  . Atrial flutter by electrocardiogram ([place]) 07/25/2014  . Swelling of limb 06/10/2014  . Lung cancer ([place]) 10/15/2013  . Hypotension 09/10/2013  . Adrenal mass ([place]) 09/09/2013  . 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 Pulmonary nodules 08/19/2013  . Anemia of chronic disease 07/03/2013  . BPH (benign prostatic hyperplasia) 07/03/2013  . GERD (gastroesophageal reflux disease) 07/03/2013  . Fall 07/03/2013  . Alcohol intoxication ([place]) 07/03/2013  . 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 Alcohol abuse 10/15/2011  . Hyponatremia 10/15/2011    Past Surgical History:  Procedure Laterality Date  . ANKLE ARTHRODESIS  1995   rt fx-hardware in  . CARDIOVERSION N/A 09/29/2014   Procedure: CARDIOVERSION;  Surgeon: Josue Hector, MD;  Location: [facility];  Service: Cardiovascular;  Laterality: N/A;  . CHOLECYSTECTOMY  09/08/2015    laproscopic   . CHOLECYSTECTOMY N/A 09/08/2015   Procedure: LAPAROSCOPIC CHOLECYSTECTOMY WITH ATTEMPTED INTRAOPERATIVE CHOLANGIOGRAM ;  Surgeon: Fanny Skates, MD;  Location: [facility];  Service:  General;  Laterality: N/A;  . COLONOSCOPY    . EMBOLECTOMY Left 01/06/2020   Procedure: Embolectomy left axillary, brachial, radial, and ulnar artery;  Surgeon: Elam Dutch, MD;  Location: [facility];  Service: Vascular;  Laterality: Left;  . ERCP N/A 09/02/2015   Procedure: ENDOSCOPIC RETROGRADE CHOLANGIOPANCREATOGRAPHY (ERCP);  Surgeon: Arta Silence, MD;  Location: [facility];  Service: Endoscopy;  Laterality: N/A;  . ERCP N/A 09/07/2015   Procedure: ENDOSCOPIC RETROGRADE CHOLANGIOPANCREATOGRAPHY (ERCP);  Surgeon: Clarene Essex, MD;  Location: [facility];  Service: Endoscopy;  Laterality: N/A;  . EXCISION / CURETTAGE BONE CYST FINGER  2005   rt thumb  . HEMORRHOID SURGERY    . LAPAROTOMY N/A 01/05/2020   Procedure: EXPLORATORY LAPAROTOMY sigmoid colon resection creation end colostomy for perforated diverticulitis and purulent peritonitis;  Surgeon: Kieth Brightly Arta Bruce, MD;  Location: [facility];  Service: General;  Laterality: N/A;  . Percutaneous Cholecytostomy tube     IVR insertion 06-13-15(Dr. Vernard Gambles)- 08-28-15 remains in place to drainage bag  . TONSILLECTOMY    . TOTAL SHOULDER ARTHROPLASTY  02/03/2012   Procedure: TOTAL SHOULDER ARTHROPLASTY;  Surgeon: [name], MD;  Location: [facility];  Service: Orthopedics;  Laterality: Left;       Family History  Problem Relation Age of Onset  . Lung cancer Mother   . Hypertension Father   . Heart attack Father   . Diabetes Paternal Grandmother   . Brain cancer Brother   . Heart attack Brother   . Neuropathy Neg Hx     Social History   Tobacco Use  . Smoking status: Former Smoker    Packs/day: 2.00    Years: 30.00    Pack years: 60.00    Types: Cigarettes    Quit date: 12/05/1990    Years since quitting: 29.1  . Smokeless tobacco: Never Used  Substance Use Topics  . Alcohol use: Yes    Alcohol/week: 0.0 standard drinks    Comment: past hx.ETOH abuse,08-31-15 "occ. wine ,drinks non alcoholic beer occ."  . Drug  use: No    Home Medications Prior to Admission medications   Medication Sig Start Date End Date Taking? Authorizing Provider  ALPRAZolam Duanne Moron) 0.5 MG tablet Take 1 tablet (0.5 mg total) by mouth at bedtime as needed. 01/10/20   Ghimire, Henreitta Leber, MD  apixaban (ELIQUIS) 5 MG TABS tablet Take 5 mg by mouth 2 (two) times daily.    [provider]  Ascorbic Acid (VITAMIN C PO) Take 1,000 mg by mouth daily.     [provider]  B Complex-C (SUPER B COMPLEX PO) Take 1 each by mouth daily.     [provider]  calcium carbonate (OS-CAL) 600 MG TABS Take 600 mg by mouth daily with breakfast.    [provider]  demeclocycline (DECLOMYCIN) 150 MG tablet Take 2 tablets twice a day Patient taking differently: Take 300 mg by mouth 2 (two) times daily.  07/09/19   Heilingoetter, Cassandra L, PA-C  diltiazem (CARDIZEM CD) 180 MG 24 hr capsule Take 1 capsule (180 mg total) by mouth daily at 12 noon. 01/09/15   Sherran Needs, NP  finasteride (PROSCAR) 5 MG tablet Take 1 tablet (5 mg total) by mouth daily. 01/10/20   Ghimire, Henreitta Leber, MD  fluticasone Asencion Islam)  50 MCG/ACT nasal spray Place 2 sprays into both nostrils at bedtime.  10/17/19   [provider]  HYDROcodone-acetaminophen (NORCO/VICODIN) 5-325 MG tablet Take 1 tablet by mouth 3 (three) times daily as needed for moderate pain. 01/10/20   Ghimire, Henreitta Leber, MD  KLOR-CON M20 20 MEQ tablet Take 20 mEq by mouth every morning.  10/18/14   [provider]  Lactobacillus (ACIDOPHILUS) CAPS capsule Take 1 capsule by mouth daily.    [provider]  levothyroxine (SYNTHROID, LEVOTHROID) 50 MCG tablet TAKE 1 TABLET (50 MCG TOTAL) BY MOUTH DAILY BEFORE BREAKFAST. 03/24/18   [name], MD  MAGNESIUM PO Take 400 mg by mouth daily.     [provider]  mirtazapine (REMERON) 30 MG tablet TAKE 1 TABLET(30 MG) BY MOUTH AT BEDTIME Patient taking differently: Take 30 mg by mouth at bedtime.   11/28/19   [name], MD  Multiple Vitamin (MULTIVITAMIN WITH MINERALS) TABS Take 1 tablet by mouth daily.    [provider]  Nivolumab (OPDIVO IV) Inject as directed every 30 (thirty) days. Receives 480mg  every 28 days.    [provider]  Nutritional Supplements (ENSURE COMPLETE PO) Take 1 Can by mouth 2 (two) times daily.    [provider]  Omega-3 Fatty Acids (FISH OIL PO) Take 1,000 mg by mouth daily.     [provider]  tamsulosin (FLOMAX) 0.4 MG CAPS capsule Take 1 capsule (0.4 mg total) by mouth 2 (two) times daily. 01/10/20   Ghimire, Henreitta Leber, MD  thiamine (VITAMIN B-1) 100 MG tablet Take 1 tablet (100 mg total) by mouth daily. 09/11/13   Erick Colace, NP  vitamin E 400 UNIT capsule Take 400 Units by mouth daily.    [provider]    Allergies    Patient has no known allergies.  Review of Systems   Review of Systems  Constitutional:       Per HPI, otherwise negative  HENT:       Per HPI, otherwise negative  Respiratory:       Per HPI, otherwise negative  Cardiovascular:       Per HPI, otherwise negative  Gastrointestinal: Negative for vomiting.  Endocrine:       Negative aside from HPI  Genitourinary:       Neg aside from HPI   Musculoskeletal:       Per HPI, otherwise negative  Skin: Negative.   Neurological: Negative for syncope.    Physical Exam Updated Vital Signs BP 111/65 (BP Location: Right Arm)   Pulse 86   Temp 98.9 F (37.2 C) (Oral)   Resp 15   SpO2 94%   Physical Exam Vitals and nursing note reviewed.  Constitutional:      General: He is not in acute distress.    Appearance: He is well-developed.  HENT:     Head: Normocephalic and atraumatic.  Eyes:     Conjunctiva/sclera: Conjunctivae normal.  Cardiovascular:     Rate and Rhythm: Normal rate and regular rhythm.  Pulmonary:     Effort: Pulmonary effort is normal. No respiratory distress.     Breath sounds: No stridor.  Abdominal:       General: There is no distension.     Tenderness: There is no abdominal tenderness. There is no guarding.     Comments: Colostomy bag left lower abdomen  Skin:    General: Skin is warm and dry.  Neurological:     Mental Status: He  is alert and oriented to person, place, and time.     ED Results / Procedures / Treatments    Skin performed by nursing tech, notable for negligible amount of fluid in the bladder.  Procedures Procedures (including critical care time)  Medications Ordered in ED Medications - No data to display  ED Course  I have reviewed the triage vital signs and the nursing notes.  Pertinent labs & imaging results that were available during my care of the patient were reviewed by me and considered in my medical decision making (see chart for details).    MDM Rules/Calculators/A&P                      11:24 PM Bladder scan demonstrates negligible fluid in the bladder, and on review is clear the patient has approximately 750 mL in his Foley catheter bag. Absent other complaints, with no fever, with demonstrated capacity to urinate into the catheter bag, no indication for additional imaging, labs, as he is afebrile, denies other complaints.  Patient discharged to follow-up with urology and primary care as needed. Final Clinical Impression(s) / ED Diagnoses Final diagnoses:  Problem with Foley catheter, initial encounter [facility])     Carmin Muskrat, MD 01/22/20 2328

## 2020-01-22 NOTE — ED Notes (Signed)
Patient was verbalized discharge instructions. Pt had no further questions at this time. NAD. 

## 2020-01-22 NOTE — ED Triage Notes (Signed)
Per EMS: Pt is coming from home with c/o foley issues. Patient states he had a catheter placed at 4pm today and reports no urination since then. Pt states he feels the pressure like he needs to pee but he is unable to. Tender in abdominal region. Hx HTN.  EMS VITALS: BP 190 pal HR 86 RR 16 SPO2 94% TEMP 98.6

## 2020-01-22 NOTE — Discharge Instructions (Signed)
As discussed, your evaluation today has been largely reassuring.  But, it is important that you monitor your condition carefully, and do not hesitate to return to the ED if you develop new, or concerning changes in your condition. ? ?Otherwise, please follow-up with your physician for appropriate ongoing care. ? ?

## 2020-01-23 ENCOUNTER — Ambulatory Visit (HOSPITAL_COMMUNITY)
Admission: RE | Admit: 2020-01-23 | Discharge: 2020-01-23 | Disposition: A | Discharge status: Home / Self Care | Payer: Medicare Other | Source: Ambulatory Visit | Attending: Internal Medicine | Admitting: Internal Medicine

## 2020-01-23 DIAGNOSIS — C3490 Malignant neoplasm of unspecified part of unspecified bronchus or lung: Secondary | ICD-10-CM | POA: Diagnosis not present

## 2020-01-23 MED ORDER — IOHEXOL 300 MG/ML  SOLN
100.0000 mL | Freq: Once | INTRAMUSCULAR | Status: AC | PRN
  Administered 2020-01-23: 100 mL via INTRAVENOUS

## 2020-01-27 ENCOUNTER — Inpatient Hospital Stay: Payer: Medicare Other

## 2020-01-27 ENCOUNTER — Other Ambulatory Visit: Payer: Self-pay

## 2020-01-27 ENCOUNTER — Inpatient Hospital Stay: Payer: Medicare Other | Attending: Internal Medicine | Admitting: Internal Medicine

## 2020-01-27 ENCOUNTER — Encounter: Payer: Self-pay | Admitting: Internal Medicine

## 2020-01-27 VITALS — HR 104 | Temp 98.3°F | Resp 17 | Ht 71.0 in | Wt 134.4 lb

## 2020-01-27 DIAGNOSIS — R5382 Chronic fatigue, unspecified: Secondary | ICD-10-CM

## 2020-01-27 DIAGNOSIS — R530 Neoplastic (malignant) related fatigue: Secondary | ICD-10-CM

## 2020-01-27 DIAGNOSIS — C3431 Malignant neoplasm of lower lobe, right bronchus or lung: Secondary | ICD-10-CM

## 2020-01-27 DIAGNOSIS — R42 Dizziness and giddiness: Secondary | ICD-10-CM | POA: Insufficient documentation

## 2020-01-27 DIAGNOSIS — F329 Major depressive disorder, single episode, unspecified: Secondary | ICD-10-CM | POA: Insufficient documentation

## 2020-01-27 DIAGNOSIS — Z8616 Personal history of COVID-19: Secondary | ICD-10-CM | POA: Diagnosis not present

## 2020-01-27 DIAGNOSIS — R634 Abnormal weight loss: Secondary | ICD-10-CM | POA: Diagnosis not present

## 2020-01-27 DIAGNOSIS — F419 Anxiety disorder, unspecified: Secondary | ICD-10-CM | POA: Insufficient documentation

## 2020-01-27 DIAGNOSIS — E871 Hypo-osmolality and hyponatremia: Secondary | ICD-10-CM | POA: Diagnosis not present

## 2020-01-27 DIAGNOSIS — G620 Drug-induced polyneuropathy: Secondary | ICD-10-CM | POA: Diagnosis not present

## 2020-01-27 DIAGNOSIS — D638 Anemia in other chronic diseases classified elsewhere: Secondary | ICD-10-CM

## 2020-01-27 DIAGNOSIS — T451X5A Adverse effect of antineoplastic and immunosuppressive drugs, initial encounter: Secondary | ICD-10-CM | POA: Diagnosis not present

## 2020-01-27 DIAGNOSIS — R52 Pain, unspecified: Secondary | ICD-10-CM | POA: Diagnosis not present

## 2020-01-27 LAB — CBC WITH DIFFERENTIAL (CANCER CENTER ONLY)
Abs Immature Granulocytes: 0.07 10*3/uL (ref 0.00–0.07)
Basophils Absolute: 0.1 10*3/uL (ref 0.0–0.1)
Basophils Relative: 1 %
Eosinophils Absolute: 0.4 10*3/uL (ref 0.0–0.5)
Eosinophils Relative: 4 %
HCT: 36.8 % — ABNORMAL LOW (ref 39.0–52.0)
Hemoglobin: 12.5 g/dL — ABNORMAL LOW (ref 13.0–17.0)
Immature Granulocytes: 1 %
Lymphocytes Relative: 8 %
Lymphs Abs: 0.8 10*3/uL (ref 0.7–4.0)
MCH: 30.8 pg (ref 26.0–34.0)
MCHC: 34 g/dL (ref 30.0–36.0)
MCV: 90.6 fL (ref 80.0–100.0)
Monocytes Absolute: 0.8 10*3/uL (ref 0.1–1.0)
Monocytes Relative: 8 %
Neutro Abs: 7.7 10*3/uL (ref 1.7–7.7)
Neutrophils Relative %: 78 %
Platelet Count: 380 10*3/uL (ref 150–400)
RBC: 4.06 MIL/uL — ABNORMAL LOW (ref 4.22–5.81)
RDW: 14.6 % (ref 11.5–15.5)
WBC Count: 9.8 10*3/uL (ref 4.0–10.5)
nRBC: 0 % (ref 0.0–0.2)

## 2020-01-27 LAB — CMP (CANCER CENTER ONLY)
ALT: 22 U/L (ref 0–44)
AST: 21 U/L (ref 15–41)
Albumin: 2.8 g/dL — ABNORMAL LOW (ref 3.5–5.0)
Alkaline Phosphatase: 147 U/L — ABNORMAL HIGH (ref 38–126)
Anion gap: 11 (ref 5–15)
BUN: 12 mg/dL (ref 8–23)
CO2: 25 mmol/L (ref 22–32)
Calcium: 8.7 mg/dL — ABNORMAL LOW (ref 8.9–10.3)
Chloride: 97 mmol/L — ABNORMAL LOW (ref 98–111)
Creatinine: 0.72 mg/dL (ref 0.61–1.24)
GFR, Est AFR Am: >60 mL/min (ref >60)
GFR, Estimated: >60 mL/min (ref >60)
Glucose, Bld: 127 mg/dL — ABNORMAL HIGH (ref 70–99)
Potassium: 3.7 mmol/L (ref 3.5–5.1)
Sodium: 133 mmol/L — ABNORMAL LOW (ref 135–145)
Total Bilirubin: 0.4 mg/dL (ref 0.3–1.2)
Total Protein: 5.9 g/dL — ABNORMAL LOW (ref 6.5–8.1)

## 2020-01-27 LAB — TSH: TSH: 4.293 u[IU]/mL — ABNORMAL HIGH (ref 0.320–4.118)

## 2020-01-27 NOTE — Progress Notes (Signed)
[place] [phone]   [phone]  OFFICE PROGRESS NOTE  [name], MD [place] [address]  DIAGNOSIS: Stage IV (T1a, N0, M1b) non-small cell lung cancer, adenocarcinoma of the right lower lobe diagnosed in October 2014  Molecular profile: Negative forRET, ALK, BRAF, MET, EGFR.  Positive for ERBB2 A775T, XYO1V886L, KRAS G13E, MAP2K1 D67N (see full report).   PRIOR THERAPY:  1) Systemic chemotherapy with carboplatin for AUC of 5 and Alimta 500 mg/M2 every 3 weeks, status post 6 cycles with stable disease. First dose on 10/23/2013. 2) Maintenance systemic chemotherapy with single agent Alimta 500 mg/M2 every 3 weeks. Status post 8 cycles.  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 Sondra Come completed on 12/16/2014. 4) stereotactic radiotherapy to the enlarging right upper lobe lung nodule under the care of Dr. Sondra Come completed on 11/22/2016. 5) Immunotherapy with Nivolumab 240 mg IV every 2 weeks, status post 60 cycles. First cycle was given on 01/28/2015. Last dose 07/05/2017.  CURRENT THERAPY: Immunotherapy with Nivolumab 480 mg IV every 4 weeks. First dose 07/19/2017. Status post 31 cycles.  INTERVAL HISTORY: Anthony Curry. 79 y.o. male returns to the clinic today for follow-up visit accompanied by his son [name].  The patient was hospitalized recently for infection with COVID-19.  He was also found to have perforated diverticulitis and he underwent sigmoid colectomy with colostomy creation.  He also has Foley's catheter placed.  He lost a lot of weight in the last few weeks.  Is feeling a little bit better and start doing some physical therapy.  He denied having any current chest pain, shortness of breath, cough or hemoptysis.  He denied having any fever or chills.  He has no current nausea, vomiting, diarrhea or constipation.  He denied having any headache or  visual changes.  He had repeat CT scan of the chest, abdomen pelvis performed recently and he is here for evaluation and discussion of his discuss results.    MEDICAL HISTORY: Past Medical History:  Diagnosis Date  . 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[place])   . Adrenal mass ([place])   . Alcoholism in remission ([place])   . Anemia of chronic disease   . 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 Anxiety   . BPH (benign prostatic hyperplasia)   . Cancer ([place])    small cell/lung  . Complication of anesthesia 09/08/2015    JITTERY AFTER GALLBLADDER SURGERY  . GERD (gastroesophageal reflux disease)   . History of radiation therapy 11/14/16, 11/18/16, 11/22/16   Left upper lung: 54 Gy in 3 fractions  . Hypertension   . Neoplastic malignant related fatigue    IV tx. every 2 weeks last9-14-16 (Dr. Earlie Server), radiation last tx. 6 months  . 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 Pericardial effusion   . Persistent atrial fibrillation ([place])   . Pulmonary nodules   . Radiation 12/02/14-12/16/14   Left adrenal gland 30 Gy in 10 fractions    ALLERGIES:  has No Known Allergies.  MEDICATIONS:  Current Outpatient Medications  Medication Sig Dispense Refill  . ALPRAZolam (XANAX) 0.5 MG tablet Take 1 tablet (0.5 mg total) by mouth at bedtime as needed. 10 tablet 0  . apixaban (ELIQUIS) 5 MG TABS tablet Take 5 mg by mouth 2 (two) times daily.    . Ascorbic Acid (VITAMIN C PO) Take 1,000 mg by mouth daily.     [name] B  Complex-C (SUPER B COMPLEX PO) Take 1 each by mouth daily.     . calcium carbonate (OS-CAL) 600 MG TABS Take 600 mg by mouth daily with breakfast.    . demeclocycline (DECLOMYCIN) 150 MG tablet Take 2 tablets twice a day (Patient taking differently: Take 300 mg by mouth 2 (two) times daily. ) 120 tablet 2  . diltiazem (CARDIZEM CD) 180 MG 24 hr capsule Take 1 capsule (180 mg total) by mouth daily at 12 noon. 90 capsule 3  . finasteride (PROSCAR) 5 MG tablet Take 1 tablet (5 mg total) by mouth  daily.    . fluticasone (FLONASE) 50 MCG/ACT nasal spray Place 2 sprays into both nostrils at bedtime.     [name] HYDROcodone-acetaminophen (NORCO/VICODIN) 5-325 MG tablet Take 1 tablet by mouth 3 (three) times daily as needed for moderate pain. 20 tablet 0  . KLOR-CON M20 20 MEQ tablet Take 20 mEq by mouth every morning.     . Lactobacillus (ACIDOPHILUS) CAPS capsule Take 1 capsule by mouth daily.    [name] levothyroxine (SYNTHROID, LEVOTHROID) 50 MCG tablet TAKE 1 TABLET (50 MCG TOTAL) BY MOUTH DAILY BEFORE BREAKFAST. 90 tablet 0  . MAGNESIUM PO Take 400 mg by mouth daily.     . mirtazapine (REMERON) 30 MG tablet TAKE 1 TABLET(30 MG) BY MOUTH AT BEDTIME (Patient taking differently: Take 30 mg by mouth at bedtime. ) 30 tablet 2  . Multiple Vitamin (MULTIVITAMIN WITH MINERALS) TABS Take 1 tablet by mouth daily.    . Nivolumab (OPDIVO IV) Inject as directed every 30 (thirty) days. Receives 417m every 28 days.    . Nutritional Supplements (ENSURE COMPLETE PO) Take 1 Can by mouth 2 (two) times daily.    . Omega-3 Fatty Acids (FISH OIL PO) Take 1,000 mg by mouth daily.     . tamsulosin (FLOMAX) 0.4 MG CAPS capsule Take 1 capsule (0.4 mg total) by mouth 2 (two) times daily. 30 capsule   . thiamine (VITAMIN B-1) 100 MG tablet Take 1 tablet (100 mg total) by mouth daily. 30 tablet 6  . vitamin E 400 UNIT capsule Take 400 Units by mouth daily.     No current facility-administered medications for this visit.    SURGICAL HISTORY:  Past Surgical History:  Procedure Laterality Date  . ANKLE ARTHRODESIS  1995   rt fx-hardware in  . CARDIOVERSION N/A 09/29/2014   Procedure: CARDIOVERSION;  Surgeon: PJosue Hector MD;  [facility];  Service: Cardiovascular;  Laterality: N/A;  . CHOLECYSTECTOMY  09/08/2015    laproscopic   . CHOLECYSTECTOMY N/A 09/08/2015   Procedure: LAPAROSCOPIC CHOLECYSTECTOMY WITH ATTEMPTED INTRAOPERATIVE CHOLANGIOGRAM ;  Surgeon: HFanny Skates MD;  [facility]  Service:  General;  Laterality: N/A;  . COLONOSCOPY    . EMBOLECTOMY Left 01/06/2020   Procedure: Embolectomy left axillary, brachial, radial, and ulnar artery;  Surgeon: FElam Dutch MD;  [facility];  Service: Vascular;  Laterality: Left;  . ERCP N/A 09/02/2015   Procedure: ENDOSCOPIC RETROGRADE CHOLANGIOPANCREATOGRAPHY (ERCP);  Surgeon: WArta Silence MD;  [facility];  Service: Endoscopy;  Laterality: N/A;  . ERCP N/A 09/07/2015   Procedure: ENDOSCOPIC RETROGRADE CHOLANGIOPANCREATOGRAPHY (ERCP);  Surgeon: MClarene Essex MD;  [facility];  Service: Endoscopy;  Laterality: N/A;  . EXCISION / CURETTAGE BONE CYST FINGER  2005   rt thumb  . HEMORRHOID SURGERY    . LAPAROTOMY N/A 01/05/2020   Procedure: EXPLORATORY LAPAROTOMY sigmoid colon resection creation end colostomy for perforated diverticulitis  and purulent peritonitis;  Surgeon: Kinsinger, Arta Bruce, MD;  Location: [facility];  Service: General;  Laterality: N/A;  . Percutaneous Cholecytostomy tube     IVR insertion 06-13-15(Dr. Vernard Gambles)- 08-28-15 remains in place to drainage bag  . TONSILLECTOMY    . TOTAL SHOULDER ARTHROPLASTY  02/03/2012   Procedure: TOTAL SHOULDER ARTHROPLASTY;  Surgeon: [name], MD;  Location: [facility];  Service: Orthopedics;  Laterality: Left;    REVIEW OF SYSTEMS:  Constitutional: positive for fatigue and weight loss Eyes: negative Ears, nose, mouth, throat, and face: negative Respiratory: negative Cardiovascular: negative Gastrointestinal: negative Genitourinary:negative Integument/breast: negative Hematologic/lymphatic: negative Musculoskeletal:positive for arthralgias and muscle weakness Neurological: positive for dizziness Behavioral/Psych: negative Endocrine: negative Allergic/Immunologic: negative   PHYSICAL EXAMINATION: General appearance: alert, cooperative, fatigued and no distress Head: Normocephalic, without obvious abnormality, atraumatic Neck: no  adenopathy, no JVD, supple, symmetrical, trachea midline and thyroid not enlarged, symmetric, no tenderness/mass/nodules Lymph nodes: Cervical, supraclavicular, and axillary nodes normal. Resp: clear to auscultation bilaterally Back: symmetric, no curvature. ROM normal. No CVA tenderness. Cardio: regular rate and rhythm, S1, S2 normal, no murmur, click, rub or gallop GI: soft, non-tender; bowel sounds normal; no masses,  no organomegaly Extremities: extremities normal, atraumatic, no cyanosis or edema Neurologic: Alert and oriented X 3, normal strength and tone. Normal symmetric reflexes. Normal coordination and gait  ECOG PERFORMANCE STATUS: 1 - Symptomatic but completely ambulatory  Pulse (!) 104, temperature 98.3 F (36.8 C), temperature source Temporal, resp. rate 17, height 5' 11"  (1.803 m), weight 134 lb 6.4 oz (61 kg), SpO2 97 %.  LABORATORY DATA: Lab Results  Component Value Date   WBC 9.8 01/27/2020   HGB 12.5 (L) 01/27/2020   HCT 36.8 (L) 01/27/2020   MCV 90.6 01/27/2020   PLT 380 01/27/2020      Chemistry      Component Value Date/Time   NA 133 (L) 01/11/2020 1017   NA 132 (L) 12/06/2017 0930   K 3.9 01/11/2020 1017   K 4.0 12/06/2017 0930   CL 98 01/11/2020 1017   CO2 26 01/11/2020 1017   CO2 26 12/06/2017 0930   BUN 16 01/11/2020 1017   BUN 15.4 12/06/2017 0930   CREATININE 0.67 01/11/2020 1017   CREATININE 0.97 12/02/2019 1134   CREATININE 0.9 12/06/2017 0930      Component Value Date/Time   CALCIUM 8.3 (L) 01/11/2020 1017   CALCIUM 9.9 12/06/2017 0930   ALKPHOS 128 (H) 01/11/2020 1017   ALKPHOS 107 12/06/2017 0930   AST 31 01/11/2020 1017   AST 17 12/02/2019 1134   AST 20 12/06/2017 0930   ALT 27 01/11/2020 1017   ALT 11 12/02/2019 1134   ALT 14 12/06/2017 0930   BILITOT 0.9 01/11/2020 1017   BILITOT 0.6 12/02/2019 1134   BILITOT 0.76 12/06/2017 0930       RADIOGRAPHIC STUDIES: DG Chest 2 View  Result Date: 01/05/2020 CLINICAL DATA:  Patient  with stage IV lung cancer. Left lower quadrant abdominal pain. Diarrhea. EXAM: CHEST - 2 VIEW COMPARISON:  CT scan from January 05, 2020 at 11:39 a.m. FINDINGS: Free air seen under the diaphragm, also appreciated on the CT scan from earlier today. Healed right rib fractures. Mild infiltrate in the medial right lung base, more prominent since January 02, 2020. Pleural thickening versus small left pleural effusion. Atelectasis or scar in the left base. The heart, hila, mediastinum, lungs, and pleura are otherwise unchanged. IMPRESSION: 1. Free air under the diaphragm. This finding was  seen on the CT scan from earlier today and called to Dr. Tana Coast by Dr. Jobe Igo at 12:30 p.m. 2. Mild opacity in the right base is likely atelectasis based on CT imaging from earlier today. 3. No other changes. Electronically Signed   By: Dorise Bullion III M.D   On: 01/05/2020 12:43   CT Chest W Contrast  Result Date: 01/23/2020 CLINICAL DATA:  Restaging lung cancer. EXAM: CT CHEST, ABDOMEN, AND PELVIS WITH CONTRAST TECHNIQUE: Multidetector CT imaging of the chest, abdomen and pelvis was performed following the standard protocol during bolus administration of intravenous contrast. CONTRAST:  154m OMNIPAQUE IOHEXOL 300 MG/ML  SOLN COMPARISON:  09/02/2019 FINDINGS: CT CHEST FINDINGS Cardiovascular: Aortic atherosclerosis. Three vessel coronary artery calcifications. Normal heart size. Small pericardial effusion, similar to previous exam. Mediastinum/Nodes: Normal appearance of the thyroid gland. The trachea appears patent and is midline. Normal appearance of the esophagus. No mediastinal or hilar adenopathy. Lungs/Pleura: New small bilateral pleural effusions are identified, right greater than left. There is a rounded area of masslike architectural distortion in the posterior right lower lobe overlying the pleural effusion. This is new from previous exam and may represent an area of rounded atelectasis. Multiple sub solid and part solid  lesions are identified throughout both lungs. -The index ground-glass nodule within the right apex measures 3.1 x 2.0 cm, image 27/4. Not significantly changed from previous exam. -the part solid lesion within the posterior right upper lobe measures 2.9 x 1.6 cm and has a solid component measuring 1 cm, image 50/4. Previously this lesion measured 2.9 x 2.3 cm with a solid component measuring 0.8 cm. -the index lesion within the right lower lobe measures 1.2 x 1.3 cm, image 90/4. Previously 1.5 x 1.4 cm. -Sub solid lesion within medial left upper lobe measures 1.1 x 0.9 cm, image 46/4. Unchanged from previous exam. -Bandlike area of increased soft tissue within the medial left apex is unchanged in appearance, image 29/4. Pure ground-glass lesion within the left upper lobe measures 2.7 cm, image 57/4. Stable. Musculoskeletal: Osteopenia. Previous left shoulder arthroplasty. Remote posttraumatic rib deformities are identified on the right. No acute or suspicious bone lesions. CT ABDOMEN PELVIS FINDINGS Hepatobiliary: There are few tiny low-attenuation foci within the liver which appear unchanged from previous exam. The largest is in the inferior right hepatic lobe measuring 1.7 cm, image 76/2. Status post cholecystectomy. No biliary dilatation. Pancreas: Unremarkable. No pancreatic ductal dilatation or surrounding inflammatory changes. Spleen: Normal in size without focal abnormality. Adrenals/Urinary Tract: Normal appearance of the left adrenal gland. Asymmetric enlargement of the right adrenal gland appears stable measuring 1.2 cm in thickness, image 65/2. Mild bilateral renal cortical scarring. No mass or hydronephrosis identified. Urinary bladder contains a Foley catheter balloon. Wall thickening with intramural gas is noted. Findings may be the sequelae of recent instrumentation. Cannot exclude cystitis Stomach/Bowel: Stomach appears normal and non-distended. No bowel wall thickening, inflammation or distension.  Left lower quadrant colostomy. Status post left hemicolectomy. Vascular/Lymphatic: Aortic atherosclerosis without aneurysm. No abdominopelvic adenopathy identified. No inguinal adenopathy. Reproductive: Prostate is unremarkable. Other: No abdominal wall hernia or abnormality. No abdominopelvic ascites. Musculoskeletal: No aggressive lytic or sclerotic bone lesions. Advanced thoracolumbar degenerative disc disease. IMPRESSION: 1. Multiple sub solid lesions are again noted in both lungs, stable from 05/13/2019. Findings are most indicative of multifocal adenocarcinoma. 2. New small bilateral pleural effusions. Masslike architectural distortion overlying the small right effusion may represent rounded atelectasis. 3. Stable appearance of small pericardial effusion. 4. Aortic atherosclerosis and 3 vessel  coronary artery calcifications. 5. Bladder wall thickening with intramural gas. Correlate for any clinical signs or symptoms of cystitis. Aortic Atherosclerosis (ICD10-I70.0) and Emphysema (ICD10-J43.9). Electronically Signed   By: Kerby Moors M.D.   On: 01/23/2020 16:40   CT Abdomen Pelvis W Contrast  Result Date: 01/23/2020 CLINICAL DATA:  Restaging lung cancer. EXAM: CT CHEST, ABDOMEN, AND PELVIS WITH CONTRAST TECHNIQUE: Multidetector CT imaging of the chest, abdomen and pelvis was performed following the standard protocol during bolus administration of intravenous contrast. CONTRAST:  177m OMNIPAQUE IOHEXOL 300 MG/ML  SOLN COMPARISON:  09/02/2019 FINDINGS: CT CHEST FINDINGS Cardiovascular: Aortic atherosclerosis. Three vessel coronary artery calcifications. Normal heart size. Small pericardial effusion, similar to previous exam. Mediastinum/Nodes: Normal appearance of the thyroid gland. The trachea appears patent and is midline. Normal appearance of the esophagus. No mediastinal or hilar adenopathy. Lungs/Pleura: New small bilateral pleural effusions are identified, right greater than left. There is a rounded  area of masslike architectural distortion in the posterior right lower lobe overlying the pleural effusion. This is new from previous exam and may represent an area of rounded atelectasis. Multiple sub solid and part solid lesions are identified throughout both lungs. -The index ground-glass nodule within the right apex measures 3.1 x 2.0 cm, image 27/4. Not significantly changed from previous exam. -the part solid lesion within the posterior right upper lobe measures 2.9 x 1.6 cm and has a solid component measuring 1 cm, image 50/4. Previously this lesion measured 2.9 x 2.3 cm with a solid component measuring 0.8 cm. -the index lesion within the right lower lobe measures 1.2 x 1.3 cm, image 90/4. Previously 1.5 x 1.4 cm. -Sub solid lesion within medial left upper lobe measures 1.1 x 0.9 cm, image 46/4. Unchanged from previous exam. -Bandlike area of increased soft tissue within the medial left apex is unchanged in appearance, image 29/4. Pure ground-glass lesion within the left upper lobe measures 2.7 cm, image 57/4. Stable. Musculoskeletal: Osteopenia. Previous left shoulder arthroplasty. Remote posttraumatic rib deformities are identified on the right. No acute or suspicious bone lesions. CT ABDOMEN PELVIS FINDINGS Hepatobiliary: There are few tiny low-attenuation foci within the liver which appear unchanged from previous exam. The largest is in the inferior right hepatic lobe measuring 1.7 cm, image 76/2. Status post cholecystectomy. No biliary dilatation. Pancreas: Unremarkable. No pancreatic ductal dilatation or surrounding inflammatory changes. Spleen: Normal in size without focal abnormality. Adrenals/Urinary Tract: Normal appearance of the left adrenal gland. Asymmetric enlargement of the right adrenal gland appears stable measuring 1.2 cm in thickness, image 65/2. Mild bilateral renal cortical scarring. No mass or hydronephrosis identified. Urinary bladder contains a Foley catheter balloon. Wall thickening  with intramural gas is noted. Findings may be the sequelae of recent instrumentation. Cannot exclude cystitis Stomach/Bowel: Stomach appears normal and non-distended. No bowel wall thickening, inflammation or distension. Left lower quadrant colostomy. Status post left hemicolectomy. Vascular/Lymphatic: Aortic atherosclerosis without aneurysm. No abdominopelvic adenopathy identified. No inguinal adenopathy. Reproductive: Prostate is unremarkable. Other: No abdominal wall hernia or abnormality. No abdominopelvic ascites. Musculoskeletal: No aggressive lytic or sclerotic bone lesions. Advanced thoracolumbar degenerative disc disease. IMPRESSION: 1. Multiple sub solid lesions are again noted in both lungs, stable from 05/13/2019. Findings are most indicative of multifocal adenocarcinoma. 2. New small bilateral pleural effusions. Masslike architectural distortion overlying the small right effusion may represent rounded atelectasis. 3. Stable appearance of small pericardial effusion. 4. Aortic atherosclerosis and 3 vessel coronary artery calcifications. 5. Bladder wall thickening with intramural gas. Correlate for any clinical signs  or symptoms of cystitis. Aortic Atherosclerosis (ICD10-I70.0) and Emphysema (ICD10-J43.9). Electronically Signed   By: Kerby Moors M.D.   On: 01/23/2020 16:40   CT ABDOMEN PELVIS W CONTRAST  Result Date: 01/05/2020 CLINICAL DATA:  Nausea and vomiting. Acute diverticulitis. Worsening pain. Fevers. EXAM: CT ABDOMEN AND PELVIS WITH CONTRAST TECHNIQUE: Multidetector CT imaging of the abdomen and pelvis was performed using the standard protocol following bolus administration of intravenous contrast. CONTRAST:  126m OMNIPAQUE IOHEXOL 300 MG/ML  SOLN COMPARISON:  01/02/2020 FINDINGS: Lower chest: Bibasilar atelectasis. Normal heart size with small chronic pericardial effusion. Small bilateral pleural effusions, new. Multivessel coronary artery atherosclerosis. Hepatobiliary: Scattered  well-circumscribed low-density liver lesions are likely cysts. Normal gallbladder, without biliary ductal dilatation. Pancreas: Normal, without mass or ductal dilatation. Spleen: Normal in size, without focal abnormality. Adrenals/Urinary Tract: Normal left adrenal gland. Mild right adrenal thickening. Right renal cortical thinning. No hydronephrosis. Decompressed urinary bladder around a Foley catheter. Stomach/Bowel: Normal stomach, without wall thickening. Scattered colonic diverticula. Increased wall thickening involving the proximal sigmoid, including on 67/2. Mid small bowel loops measure up to 2.4 cm. Pelvic small bowel loops are mildly thickened, including on 73/2. Vascular/Lymphatic: Advanced aortic and branch vessel atherosclerosis. No abdominopelvic adenopathy. Reproductive: Normal prostate. Other: New small volume cul-de-sac fluid, including on 71/2. Extensive, moderate volume free intraperitoneal air, including within the right upper quadrant on 27/2 and adjacent the right lobe of the liver on 29/2. Multiple small locules of pelvic free air, including on 61/2. Enhancing small volume fluid within the anterior left pelvis on 66/2 and up to 3.8 x 1.5 cm just inferior to this on 71/2. Musculoskeletal: Bilateral remote rib fractures. Lumbosacral spondylosis. IMPRESSION: 1. Moderate free intraperitoneal air, likely due to complicated sigmoid diverticulitis. Small volume pericolonic fluid and enhancement, suspicious for infected ascites. 2. Borderline small bowel dilatation, suggesting adynamic ileus. Small bowel wall thickening within the pelvis is likely due to secondary enteritis. 3. New small bilateral pleural effusions with similar pericardial effusion. 4. Coronary artery atherosclerosis. Aortic Atherosclerosis (ICD10-I70.0). Critical test results telephoned to Dr. RTana Coast At the time of interpretation at 12:30 p.m.On 01/05/2020. Electronically Signed   By: KAbigail MiyamotoM.D.   On: 01/05/2020 12:32   CT  ABDOMEN PELVIS W CONTRAST  Result Date: 01/02/2020 CLINICAL DATA:  Lower abdominal pain. The patient is on immunotherapy for lung carcinoma. EXAM: CT ABDOMEN AND PELVIS WITH CONTRAST TECHNIQUE: Multidetector CT imaging of the abdomen and pelvis was performed using the standard protocol following bolus administration of intravenous contrast. CONTRAST:  100 mL OMNIPAQUE IOHEXOL 300 MG/ML  SOLN COMPARISON:  CT chest, abdomen and pelvis 05/13/2019. FINDINGS: Lower chest: No pleural effusion. Small pericardial effusion is unchanged. Lung bases are emphysematous. There is mild dependent atelectasis. Hepatobiliary: A few small low attenuating lesions in the liver likely representing cysts are unchanged. Status post cholecystectomy. Biliary tree unremarkable. Pancreas: Unremarkable. No pancreatic ductal dilatation or surrounding inflammatory changes. Spleen: Normal in size without focal abnormality. 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 The left adrenal gland appears normal. There is some scarring in the right kidney which is unchanged. The kidneys otherwise appear normal. No stones or hydronephrosis. The urinary bladder is distended but otherwise unremarkable. Stomach/Bowel: Diverticulosis is identified. There is thickening and stranding near the junction of the descending and sigmoid consistent with acute diverticulitis. No abscess or perforation. The stomach and small bowel appear normal. The appendix is normal in appearance. Note is made that the cecum is positioned in the right upper quadrant. Vascular/Lymphatic:  Aortic atherosclerosis. No enlarged abdominal or pelvic lymph nodes. Reproductive: Prostate is unremarkable. Other: None. Musculoskeletal: No acute or focal abnormality. Severe degenerative change throughout the visualized spine is noted. IMPRESSION: Diverticulosis with acute diverticulitis the junction of the distal descending and sigmoid colon. No abscess or perforation. No  change in a moderate pericardial effusion. Emphysema. Extensive atherosclerosis. Electronically Signed   By: Inge Rise M.D.   On: 01/02/2020 13:10   DG CHEST PORT 1 VIEW  Result Date: 01/02/2020 CLINICAL DATA:  79 year old male with stage IV lung cancer, SI ADH. Undergoing chemotherapy. Positive COVID-19 yesterday. EXAM: PORTABLE CHEST 1 VIEW COMPARISON:  CT Abdomen and Pelvis earlier today. Chest CT 09/02/2019 and earlier. FINDINGS: Portable AP semi upright view at 1741 hours. Stable lung volumes and mediastinal contours since 2020. Numerous chronic right rib fractures. Chronic reticular opacity in the right upper lobe and near the left hilum. Elsewhere when allowing for portable technique the lungs are clear. No pneumothorax, pulmonary edema or pleural effusion. Stable visualized osseous structures. IMPRESSION: Stable radiographic appearance of chronic lung disease since July 2020. No acute cardiopulmonary abnormality. Electronically Signed   By: Genevie Ann M.D.   On: 01/02/2020 18:38   VAS [place] UPPER EXTREMITY ARTERIAL DUPLEX  Result Date: 01/06/2020 UPPER EXTREMITY DUPLEX STUDY Indications: Patient complains of cool left upper extremity with pallor. History:     Patient has a history ofrecent left upper extremity arterial line.  Comparison Study: No prior study. Performing Technologist: Maudry Mayhew MHA, RDMS, RVT, RDCS  Examination Guidelines: A complete evaluation includes B-mode imaging, spectral Doppler, color Doppler, and power Doppler as needed of all accessible portions of each vessel. Bilateral testing is considered an integral part of a complete examination. Limited examinations for reoccurring indications may be performed as noted.  Right Doppler Findings: +---------------+----------+--------+--------+--------+ Site           PSV (cm/s)WaveformStenosisComments +---------------+----------+--------+--------+--------+ Subclavian Prox134       biphasic                  +---------------+----------+--------+--------+--------+  Left Doppler Findings: +---------------+----------+--------------+--------+--------+ Site           PSV (cm/s)Waveform      StenosisComments +---------------+----------+--------------+--------+--------+ Subclavian Prox17        monophasic                     +---------------+----------+--------------+--------+--------+ Subclavian Mid 12        monophasic                     +---------------+----------+--------------+--------+--------+ Axillary       15.00     monophasic                     +---------------+----------+--------------+--------+--------+ Brachial Prox  14        monophasic                     +---------------+----------+--------------+--------+--------+ Brachial Dist  11        monophasic                     +---------------+----------+--------------+--------+--------+ Radial Prox    4         barely audible                 +---------------+----------+--------------+--------+--------+ Radial Dist              barely audible                 +---------------+----------+--------------+--------+--------+  Ulnar Dist     10        barely audible                 +---------------+----------+--------------+--------+--------+ Palmar Arch    6         barely audible                 +---------------+----------+--------------+--------+--------+   Summary:  Right: No obstruction visualized in the right subclavian artery. Left: Monophasic flow at the proximal left subclavian artery is       suggestive of proximal obstruction. Radial, ulnar, and palmar       arch signals are patent, however signals are barely       detectable. *See table(s) above for measurements and observations. Electronically signed by Ruta Hinds MD on 01/06/2020 at 5:06:56 PM.    Final    ECHOCARDIOGRAM COMPLETE  Result Date: 01/02/2020   ECHOCARDIOGRAM REPORT   Patient Name:   Jesiah Grismer. Date of Exam: 01/02/2020  Medical Rec #:  [id number]           Height:       71.0 in Accession #:    [id number]          Weight:       145.0 lb Date of Birth:  [birth year]          BSA:          1.84 m Patient Age:    81 years            BP:           152/65 mmHg Patient Gender: M                   HR:           82 bpm. Exam Location:  Inpatient Procedure: 2D Echo, Cardiac Doppler and Color Doppler Indications:    Pericardial effusion 423.9  History:        Patient has prior history of Echocardiogram examinations, most                 recent 07/26/2014. Arrythmias:Atrial Fibrillation and Atrial                 Flutter; Risk Factors:Hypertension and Former Smoker. GERD.  Sonographer:    Paulita Fujita RDCS Referring Phys: [id number] AMRIT ADHIKARI  Sonographer Comments: Image acquisition challenging due to patient body habitus. IMPRESSIONS  1. Left ventricular ejection fraction, by visual estimation, is 60 to 65%. The left ventricle has normal function. There is no left ventricular hypertrophy.  2. Left ventricular diastolic parameters are consistent with Grade I diastolic dysfunction (impaired relaxation).  3. Global right ventricle has normal systolic function.The right ventricular size is normal. No increase in right ventricular wall thickness.  4. Left atrial size was normal.  5. Right atrial size was normal.  6. Large pericardial effusion.  7. The pericardial effusion is anterior to the right ventricle.  8. There is inversion of the right ventricular wall and excessive respiratory variation in the tricuspid valve spectral Doppler velocities.  9. The mitral valve is normal in structure. Mild mitral valve regurgitation. No evidence of mitral stenosis. 10. The tricuspid valve is normal in structure. 11. The tricuspid valve is normal in structure. Tricuspid valve regurgitation is mild. 12. The aortic valve is normal in structure. Aortic valve regurgitation is not visualized. No evidence of aortic valve sclerosis or stenosis. 13. The pulmonic  valve was normal in  structure. Pulmonic valve regurgitation is not visualized. 14. Mildly elevated pulmonary artery systolic pressure. 15. The inferior vena cava is normal in size with greater than 50% respiratory variability, suggesting right atrial pressure of 3 mmHg. FINDINGS  Left Ventricle: Left ventricular ejection fraction, by visual estimation, is 60 to 65%. The left ventricle has normal function. The left ventricle is not well visualized. There is no left ventricular hypertrophy. Left ventricular diastolic parameters are consistent with Grade I diastolic dysfunction (impaired relaxation). Indeterminate filling pressures. Right Ventricle: The right ventricular size is normal. No increase in right ventricular wall thickness. Global RV systolic function is has normal systolic function. The tricuspid regurgitant velocity is 2.46 m/s, and with an assumed right atrial pressure  of 8 mmHg, the estimated right ventricular systolic pressure is mildly elevated at 32.2 mmHg. Left Atrium: Left atrial size was normal in size. Right Atrium: Right atrial size was normal in size Pericardium: A large pericardial effusion is present. The pericardial effusion is anterior to the right ventricle. There is inversion of the right ventricular wall and excessive respiratory variation in the tricuspid valve spectral Doppler velocities. Pericardial effusion measuring up to 1.86 cm. Unable to visualize the IVC. Findings may be con. Mitral Valve: The mitral valve is normal in structure. Mild mitral valve regurgitation. No evidence of mitral valve stenosis by observation. Tricuspid Valve: The tricuspid valve is normal in structure. Tricuspid valve regurgitation is mild. Aortic Valve: The aortic valve is normal in structure. Aortic valve regurgitation is not visualized. The aortic valve is structurally normal, with no evidence of sclerosis or stenosis. Pulmonic Valve: The pulmonic valve was normal in structure. Pulmonic valve  regurgitation is not visualized. Pulmonic regurgitation is not visualized. Aorta: The aortic root, ascending aorta and aortic arch are all structurally normal, with no evidence of dilitation or obstruction. Venous: The inferior vena cava was not well visualized. The inferior vena cava is normal in size with greater than 50% respiratory variability, suggesting right atrial pressure of 3 mmHg. IAS/Shunts: No atrial level shunt detected by color flow Doppler. There is no evidence of a patent foramen ovale. No ventricular septal defect is seen or detected. There is no evidence of an atrial septal defect.   Diastology LV e' lateral:   5.87 cm/s LV E/e' lateral: 11.5 LV e' medial:    4.90 cm/s LV E/e' medial:  13.7  RIGHT VENTRICLE RV S prime:     28.60 cm/s TAPSE (M-mode): 2.1 cm LEFT ATRIUM             Index       RIGHT ATRIUM           Index LA Vol (A2C):   30.0 ml 16.31 ml/m RA Area:     12.10 cm LA Vol (A4C):   21.2 ml 11.53 ml/m RA Volume:   30.10 ml  16.37 ml/m LA Biplane Vol: 25.3 ml 13.76 ml/m  AORTIC VALVE LVOT Vmax:   70.30 cm/s LVOT Vmean:  50.200 cm/s LVOT VTI:    0.137 m MITRAL VALVE                        TRICUSPID VALVE MV Area (PHT): 2.96 cm             TR Peak grad:   24.2 mmHg MV PHT:        74.24 msec           TR Vmax:  246.00 cm/s MV Decel Time: 256 msec MV E velocity: 67.30 cm/s 103 cm/s  SHUNTS MV A velocity: 65.10 cm/s 70.3 cm/s Systemic VTI: 0.14 m MV E/A ratio:  1.03       1.5  Skeet Latch MD Electronically signed by Skeet Latch MD Signature Date/Time: 01/02/2020/6:33:38 PM    Final    UE Venous Duplex ([facility] and [facility] ONLY)  Result Date: 01/12/2020 UPPER VENOUS STUDY  Indications: Swelling Risk Factors: Embolectomy of the left axillary, brachial, radial, and ulnar arteries 01/06/20. Comparison Study: No prior study on file for comparison Performing Technologist: Sharion Dove RVS  Examination Guidelines: A complete evaluation includes B-mode imaging, spectral Doppler, color  Doppler, and power Doppler as needed of all accessible portions of each vessel. Bilateral testing is considered an integral part of a complete examination. Limited examinations for reoccurring indications may be performed as noted.  Right Findings: +----------+------------+---------+-----------+----------+-------+ RIGHT     CompressiblePhasicitySpontaneousPropertiesSummary +----------+------------+---------+-----------+----------+-------+ Subclavian               Yes       Yes                      +----------+------------+---------+-----------+----------+-------+  Left Findings: +----------+------------+---------+-----------+----------+-------+ LEFT      CompressiblePhasicitySpontaneousPropertiesSummary +----------+------------+---------+-----------+----------+-------+ IJV                      Yes       Yes                      +----------+------------+---------+-----------+----------+-------+ Subclavian               Yes       Yes                      +----------+------------+---------+-----------+----------+-------+ Axillary                 Yes       Yes                      +----------+------------+---------+-----------+----------+-------+ Brachial      Full       Yes       Yes                      +----------+------------+---------+-----------+----------+-------+ Radial        Full                                          +----------+------------+---------+-----------+----------+-------+ Ulnar         Full                                          +----------+------------+---------+-----------+----------+-------+ Cephalic      Full                                          +----------+------------+---------+-----------+----------+-------+ Basilic                  Yes       Yes                      +----------+------------+---------+-----------+----------+-------+  Summary:  Right: No evidence of thrombosis in the subclavian.  Left: No  evidence of deep vein thrombosis in the upper extremity. No evidence of superficial vein thrombosis in the upper extremity.  *See table(s) above for measurements and observations.  Diagnosing physician: Ruta Hinds MD Electronically signed by Ruta Hinds MD on 01/12/2020 at 11:39:55 AM.    Final    ECHOCARDIOGRAM LIMITED  Result Date: 01/08/2020   ECHOCARDIOGRAM LIMITED REPORT   Patient Name:   Juanmanuel Marohl. Date of Exam: 01/08/2020 Medical Rec #:  [id number]           Height:       71.0 in Accession #:    [id number]          Weight:       145.0 lb Date of Birth:  13-Oct-1941          BSA:          1.84 m Patient Age:    29 years            BP:           157/66 mmHg Patient Gender: M                   HR:           71 bpm. Exam Location:  Inpatient  Procedure: Limited Echo, Limited Color Doppler and Color Doppler Indications:    Pericardial Effusion  History:        Patient has prior history of Echocardiogram examinations, most                 recent 01/02/2020. Covid-19 Positive  Sonographer:    Mikki Santee RDCS (AE) Referring Phys: Eunice Blase VIJAYA AKULA IMPRESSIONS  1. Left ventricular ejection fraction, by visual estimation, is 55 to 60%.  2. Moderate to large pericardial effusion. No significant change since the last exam 01/02/20 on side by side comparison. No RV diastolic collapse. Suboptimal Doppler alignment of mitral inflow, no definite respiratory variability. Hypertensive and normal heart rate. These findings suggest no evidence of tamponade physiology. Hepatic vein Doppler not performed. IVC not visualized. Borderline respiratory related leftward ventricular septal shift. FINDINGS  Left Ventricle: Left ventricular ejection fraction, by visual estimation, is 55 to 60%. Pericardium: Moderate to large pericardial effusion is seen. Moderate to large pericardial effusion.   Diastology                 Normals LV e' lateral:   8.27 cm/s 6.42 cm/s LV E/e' lateral: 11.8      15.4 LV e' medial:    9.46  cm/s 6.96 cm/s LV E/e' medial:  10.3      6.96  MITRAL VALVE              Normals   TRICUSPID VALVE             Normals MV Area (PHT): 3.72 cm             TR Peak grad:   24.2 mmHg MV PHT:        59.16 msec 55 ms     TR Vmax:        246.00 cm/s 288 cm/s MV Decel Time: 204 msec   187 ms MV E velocity: 97.30 cm/s 103 cm/s MV A velocity: 90.00 cm/s 70.3 cm/s MV E/A ratio:  1.08       1.5  Cherlynn Kaiser MD Electronically signed by Cherlynn Kaiser MD Signature Date/Time:  01/08/2020/7:20:57 PM    Final    HYBRID OR IMAGING ([facility] ONLY)  Result Date: 01/06/2020 There is no interpretation for this exam.  This order is for images obtained during a surgical procedure.  Please See "Surgeries" Tab for more information regarding the procedure.    ASSESSMENT AND PLAN:  This is a very pleasant 79 years old white male with metastatic non-small cell lung cancer, adenocarcinoma and currently on treatment with second line immunotherapy with Nivolumab status post 60 cycles and has been tolerating his treatment well. He was recently switched to Nivolumab 480 MG IV every 4 weeks status post 31 cycles. He has been tolerating this treatment well with no concerning complaints.  He has been off treatment for the last 2 months secondary to admission to the hospital with COVID-19 infection as well as perforated diverticulitis status post sigmoid colon resection with colostomy creation. The patient had repeat CT scan of the chest, abdomen and pelvis performed recently.  I personally and independently reviewed the scans and discussed the results with the patient and his son. His scan showed no concerning findings for disease progression. I recommended for the patient to continue to hold his treatment with nivolumab for now until improvement of his general condition. I expect him to start the next cycle of his treatment in 4 weeks.  He will come back for follow-up visit at that time. For pain management, will give the patient a refill  of hydrocodone. For the depression, he will continue on his treatment with Remeron 30 mg p.o. nightly. For the hyponatremia he is currently on demeclocycline and his sodium level is normal today. For the anxiety he will continue on Xanax. For the peripheral neuropathy and dizzy spells, he is scheduled to see neurology in the next few weeks. The patient was advised to call immediately if he has any concerning symptoms in the interval. The patient voices understanding of current disease status and treatment options and is in agreement with the current care plan. All questions were answered. The patient knows to call the clinic with any problems, questions or concerns. We can certainly see the patient much sooner if necessary.  Disclaimer: This note was dictated with voice recognition software. Similar sounding words can inadvertently be transcribed and may not be corrected upon review.

## 2020-01-28 ENCOUNTER — Other Ambulatory Visit: Payer: Self-pay | Admitting: Internal Medicine

## 2020-01-28 ENCOUNTER — Telehealth: Payer: Self-pay | Admitting: Internal Medicine

## 2020-01-28 ENCOUNTER — Telehealth: Payer: Self-pay

## 2020-01-28 MED ORDER — HYDROCODONE-ACETAMINOPHEN 5-325 MG PO TABS: 1.0000 | ORAL_TABLET | Freq: Three times a day (TID) | ORAL | 0 refills | Status: DC | PRN

## 2020-01-28 NOTE — Telephone Encounter (Signed)
Patient is asking for a refill of Hydrocodone (VICODIN) medication.

## 2020-01-28 NOTE — Telephone Encounter (Signed)
Scheduled per los. Called and spoke with patient. confimed appt

## 2020-01-29 ENCOUNTER — Telehealth: Payer: Self-pay

## 2020-01-29 DIAGNOSIS — U071 COVID-19: Secondary | ICD-10-CM | POA: Diagnosis not present

## 2020-01-29 DIAGNOSIS — J9601 Acute respiratory failure with hypoxia: Secondary | ICD-10-CM | POA: Diagnosis not present

## 2020-01-29 DIAGNOSIS — Z466 Encounter for fitting and adjustment of urinary device: Secondary | ICD-10-CM | POA: Diagnosis not present

## 2020-01-29 DIAGNOSIS — Z4801 Encounter for change or removal of surgical wound dressing: Secondary | ICD-10-CM | POA: Diagnosis not present

## 2020-01-29 DIAGNOSIS — J1282 Pneumonia due to coronavirus disease 2019: Secondary | ICD-10-CM | POA: Diagnosis not present

## 2020-01-29 DIAGNOSIS — Z48815 Encounter for surgical aftercare following surgery on the digestive system: Secondary | ICD-10-CM | POA: Diagnosis not present

## 2020-01-29 NOTE — Telephone Encounter (Signed)
TCT patient regarding Hydrocodone medication refill sent to his pharmacy for pick up, patient verbalized understanding.

## 2020-01-30 ENCOUNTER — Emergency Department (HOSPITAL_COMMUNITY)
Admission: EM | Admit: 2020-01-30 | Discharge: 2020-01-30 | Disposition: A | Discharge status: Home / Self Care | Payer: Medicare Other | Attending: Emergency Medicine | Admitting: Emergency Medicine

## 2020-01-30 ENCOUNTER — Other Ambulatory Visit: Payer: Self-pay

## 2020-01-30 ENCOUNTER — Emergency Department (HOSPITAL_COMMUNITY): Payer: Medicare Other

## 2020-01-30 ENCOUNTER — Encounter (HOSPITAL_COMMUNITY): Payer: Self-pay

## 2020-01-30 DIAGNOSIS — Z79899 Other long term (current) drug therapy: Secondary | ICD-10-CM | POA: Insufficient documentation

## 2020-01-30 DIAGNOSIS — N451 Epididymitis: Secondary | ICD-10-CM

## 2020-01-30 DIAGNOSIS — Z466 Encounter for fitting and adjustment of urinary device: Secondary | ICD-10-CM | POA: Diagnosis not present

## 2020-01-30 DIAGNOSIS — I10 Essential (primary) hypertension: Secondary | ICD-10-CM | POA: Insufficient documentation

## 2020-01-30 DIAGNOSIS — J9601 Acute respiratory failure with hypoxia: Secondary | ICD-10-CM | POA: Diagnosis not present

## 2020-01-30 DIAGNOSIS — Z87891 Personal history of nicotine dependence: Secondary | ICD-10-CM | POA: Insufficient documentation

## 2020-01-30 DIAGNOSIS — N50811 Right testicular pain: Secondary | ICD-10-CM | POA: Diagnosis not present

## 2020-01-30 DIAGNOSIS — J1282 Pneumonia due to coronavirus disease 2019: Secondary | ICD-10-CM | POA: Diagnosis not present

## 2020-01-30 DIAGNOSIS — Z48815 Encounter for surgical aftercare following surgery on the digestive system: Secondary | ICD-10-CM | POA: Diagnosis not present

## 2020-01-30 DIAGNOSIS — Z4801 Encounter for change or removal of surgical wound dressing: Secondary | ICD-10-CM | POA: Diagnosis not present

## 2020-01-30 DIAGNOSIS — N453 Epididymo-orchitis: Secondary | ICD-10-CM | POA: Diagnosis not present

## 2020-01-30 DIAGNOSIS — U071 COVID-19: Secondary | ICD-10-CM | POA: Diagnosis not present

## 2020-01-30 DIAGNOSIS — Z85118 Personal history of other malignant neoplasm of bronchus and lung: Secondary | ICD-10-CM | POA: Diagnosis not present

## 2020-01-30 DIAGNOSIS — N433 Hydrocele, unspecified: Secondary | ICD-10-CM | POA: Diagnosis not present

## 2020-01-30 DIAGNOSIS — R338 Other retention of urine: Secondary | ICD-10-CM | POA: Diagnosis not present

## 2020-01-30 DIAGNOSIS — Z7901 Long term (current) use of anticoagulants: Secondary | ICD-10-CM | POA: Insufficient documentation

## 2020-01-30 HISTORY — DX: Epididymo-orchitis: N45.3

## 2020-01-30 LAB — CBC WITH DIFFERENTIAL/PLATELET
Abs Immature Granulocytes: 0.15 10*3/uL — ABNORMAL HIGH (ref 0.00–0.07)
Basophils Absolute: 0.1 10*3/uL (ref 0.0–0.1)
Basophils Relative: 1 %
Eosinophils Absolute: 0.2 10*3/uL (ref 0.0–0.5)
Eosinophils Relative: 2 %
HCT: 37.2 % — ABNORMAL LOW (ref 39.0–52.0)
Hemoglobin: 12.7 g/dL — ABNORMAL LOW (ref 13.0–17.0)
Immature Granulocytes: 1 %
Lymphocytes Relative: 7 %
Lymphs Abs: 0.9 10*3/uL (ref 0.7–4.0)
MCH: 30.8 pg (ref 26.0–34.0)
MCHC: 34.1 g/dL (ref 30.0–36.0)
MCV: 90.1 fL (ref 80.0–100.0)
Monocytes Absolute: 1.4 10*3/uL — ABNORMAL HIGH (ref 0.1–1.0)
Monocytes Relative: 10 %
Neutro Abs: 10.2 10*3/uL — ABNORMAL HIGH (ref 1.7–7.7)
Neutrophils Relative %: 79 %
Platelets: 395 10*3/uL (ref 150–400)
RBC: 4.13 MIL/uL — ABNORMAL LOW (ref 4.22–5.81)
RDW: 14.5 % (ref 11.5–15.5)
WBC: 12.9 10*3/uL — ABNORMAL HIGH (ref 4.0–10.5)
nRBC: 0 % (ref 0.0–0.2)

## 2020-01-30 LAB — URINALYSIS, ROUTINE W REFLEX MICROSCOPIC
Bilirubin Urine: NEGATIVE
Glucose, UA: NEGATIVE mg/dL
Hgb urine dipstick: NEGATIVE
Ketones, ur: NEGATIVE mg/dL
Nitrite: NEGATIVE
Protein, ur: NEGATIVE mg/dL
Specific Gravity, Urine: 1.006 (ref 1.005–1.030)
pH: 8 (ref 5.0–8.0)

## 2020-01-30 LAB — BASIC METABOLIC PANEL
Anion gap: 10 (ref 5–15)
BUN: 13 mg/dL (ref 8–23)
CO2: 27 mmol/L (ref 22–32)
Calcium: 8.8 mg/dL — ABNORMAL LOW (ref 8.9–10.3)
Chloride: 93 mmol/L — ABNORMAL LOW (ref 98–111)
Creatinine, Ser: 0.81 mg/dL (ref 0.61–1.24)
GFR calc Af Amer: >60 mL/min (ref >60)
GFR calc non Af Amer: >60 mL/min (ref >60)
Glucose, Bld: 113 mg/dL — ABNORMAL HIGH (ref 70–99)
Potassium: 4.3 mmol/L (ref 3.5–5.1)
Sodium: 130 mmol/L — ABNORMAL LOW (ref 135–145)

## 2020-01-30 MED ORDER — LEVOFLOXACIN 500 MG PO TABS: 500.0000 mg | ORAL_TABLET | Freq: Every day | ORAL | 0 refills | Status: DC

## 2020-01-30 MED ORDER — LEVOFLOXACIN 500 MG PO TABS
500.0000 mg | ORAL_TABLET | Freq: Once | ORAL | Status: AC
  Administered 2020-01-30: 500 mg via ORAL
  Filled 2020-01-30: qty 1

## 2020-01-30 MED ORDER — OXYCODONE-ACETAMINOPHEN 5-325 MG PO TABS
1.0000 | ORAL_TABLET | Freq: Once | ORAL | Status: AC
  Administered 2020-01-30: 1 via ORAL
  Filled 2020-01-30: qty 1

## 2020-01-30 NOTE — ED Triage Notes (Signed)
Pt states that his R testicle has swollen overnight to the size of a tennis ball. Urology recommended that he come in.   Pt does not want to take off his pants until the doctor is ready.

## 2020-01-30 NOTE — ED Provider Notes (Signed)
[facility] DEPT Provider Note   CSN: [id number] Arrival date & time: 01/30/20  0556   History Chief Complaint  Patient presents with  . Testicle Pain    Anthony Curry. is a 79 y.o. male.  The history is provided by the patient.  Testicle Pain  He has history of hypertension, small cell lung cancer, atrial fibrillation anticoagulated on apixaban and comes in because of pain and swelling of the right testicle which started last night.  He rates his pain at 8/10.  He denies fever, chills, sweats.  He does have an indwelling Foley catheter and a colostomy.  Past Medical History:  Diagnosis Date  . 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 Adrenal mass ([place])   . Alcoholism in remission ([place])   . Anemia of chronic disease   . 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 Anxiety   . BPH (benign prostatic hyperplasia)   . Cancer ([place])    small cell/lung  . Complication of anesthesia 09/08/2015    JITTERY AFTER GALLBLADDER SURGERY  . GERD (gastroesophageal reflux disease)   . History of radiation therapy 11/14/16, 11/18/16, 11/22/16   Left upper lung: 54 Gy in 3 fractions  . Hypertension   . Neoplastic malignant related fatigue    IV tx. every 2 weeks last9-14-16 (Dr. Earlie Server), radiation last tx. 6 months  . 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 Pericardial effusion   . Persistent atrial fibrillation ([place])   . Pulmonary nodules   . Radiation 12/02/14-12/16/14   Left adrenal gland 30 Gy in 10 fractions    Patient Active Problem List   Diagnosis Date Noted  . Diverticulitis 01/02/2020  . Acute diverticulitis 01/02/2020  . Ataxia 06/30/2019  . 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 Generalized weakness 06/28/2019  . Lactic acidosis 06/28/2019  . Abnormal urinalysis 06/28/2019  . Hypertension 12/07/2016  . 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 Acute urinary retention 09/09/2015  .  Choledocholithiasis with cholecystitis 09/08/2015  . Common bile duct stone   . Encounter for antineoplastic immunotherapy 06/28/2015  . Atrial fibrillation ([place]) 06/11/2015  . Gynecomastia, male 06/10/2015  . Dizziness 05/11/2015  . Malignant neoplasm of lower lobe of right lung ([place]) 09/17/2014  . Neutropenic fever ([place]) 09/02/2014  . Neoplastic malignant related fatigue 08/13/2014  . Physical deconditioning 08/13/2014  . Hypoalbuminemia 08/13/2014  . Diarrhea 08/13/2014  . Antineoplastic chemotherapy induced anemia(285.3) 08/13/2014  . Atrial fibrillation with RVR ([place]) 07/28/2014  . HCAP (healthcare-associated pneumonia) 07/25/2014  . Alcoholism in remission ([place]) 07/25/2014  . Atrial flutter by electrocardiogram ([place]) 07/25/2014  . Swelling of limb 06/10/2014  . Lung cancer ([place]) 10/15/2013  . Hypotension 09/10/2013  . Adrenal mass ([place]) 09/09/2013  . 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 Pulmonary nodules 08/19/2013  . Anemia of chronic disease 07/03/2013  . BPH (benign prostatic hyperplasia) 07/03/2013  . GERD (gastroesophageal reflux disease) 07/03/2013  . Fall 07/03/2013  . Alcohol intoxication ([place]) 07/03/2013  . 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 Alcohol abuse 10/15/2011  . Hyponatremia 10/15/2011    Past Surgical History:  Procedure Laterality Date  . ANKLE ARTHRODESIS  1995   rt fx-hardware in  . CARDIOVERSION N/A 09/29/2014   Procedure: CARDIOVERSION;  Surgeon: Josue Hector, MD;  Location: [facility] ENDOSCOPY;  Service: Cardiovascular;  Laterality: N/A;  . CHOLECYSTECTOMY  09/08/2015    laproscopic   . CHOLECYSTECTOMY N/A 09/08/2015   Procedure: LAPAROSCOPIC CHOLECYSTECTOMY WITH ATTEMPTED INTRAOPERATIVE CHOLANGIOGRAM ;  Surgeon:  Fanny Skates, MD;  Location: [facility];  Service: General;  Laterality: N/A;  . COLONOSCOPY    . EMBOLECTOMY Left 01/06/2020   Procedure: Embolectomy left axillary, brachial, radial, and ulnar artery;  Surgeon: Elam Dutch, MD;  Location: [facility] OR;  Service: Vascular;  Laterality: Left;  . ERCP N/A 09/02/2015   Procedure: ENDOSCOPIC RETROGRADE CHOLANGIOPANCREATOGRAPHY (ERCP);  Surgeon: Arta Silence, MD;  Location: [facility];  Service: Endoscopy;  Laterality: N/A;  . ERCP N/A 09/07/2015   Procedure: ENDOSCOPIC RETROGRADE CHOLANGIOPANCREATOGRAPHY (ERCP);  Surgeon: Clarene Essex, MD;  Location: [facility] ENDOSCOPY;  Service: Endoscopy;  Laterality: N/A;  . EXCISION / CURETTAGE BONE CYST FINGER  2005   rt thumb  . HEMORRHOID SURGERY    . LAPAROTOMY N/A 01/05/2020   Procedure: EXPLORATORY LAPAROTOMY sigmoid colon resection creation end colostomy for perforated diverticulitis and purulent peritonitis;  Surgeon: Kieth Brightly Arta Bruce, MD;  Location: [facility] ORS;  Service: General;  Laterality: N/A;  . Percutaneous Cholecytostomy tube     IVR insertion 06-13-15(Dr. Vernard Gambles)- 08-28-15 remains in place to drainage bag  . TONSILLECTOMY    . TOTAL SHOULDER ARTHROPLASTY  02/03/2012   Procedure: TOTAL SHOULDER ARTHROPLASTY;  Surgeon: [name], MD;  Location: [facility];  Service: Orthopedics;  Laterality: Left;       Family History  Problem Relation Age of Onset  . Lung cancer Mother   . Hypertension Father   . Heart attack Father   . Diabetes Paternal Grandmother   . Brain cancer Brother   . Heart attack Brother   . Neuropathy Neg Hx     Social History   Tobacco Use  . Smoking status: Former Smoker    Packs/day: 2.00    Years: 30.00    Pack years: 60.00    Types: Cigarettes    Quit date: 12/05/1990    Years since quitting: 29.1  . Smokeless tobacco: Never Used  Substance Use Topics  . Alcohol use: Yes    Alcohol/week: 0.0 standard drinks    Comment: past hx.ETOH abuse,08-31-15 "occ. wine ,drinks non alcoholic beer occ."  . Drug use: No    Home Medications Prior to Admission medications   Medication Sig Start Date End Date Taking? Authorizing Provider  ALPRAZolam Duanne Moron) 0.5 MG tablet Take 1 tablet  (0.5 mg total) by mouth at bedtime as needed. 01/10/20   Ghimire, Henreitta Leber, MD  apixaban (ELIQUIS) 5 MG TABS tablet Take 5 mg by mouth 2 (two) times daily.    [provider]  Ascorbic Acid (VITAMIN C PO) Take 1,000 mg by mouth daily.     [provider]  B Complex-C (SUPER B COMPLEX PO) Take 1 each by mouth daily.     [provider]  calcium carbonate (OS-CAL) 600 MG TABS Take 600 mg by mouth daily with breakfast.    [provider]  demeclocycline (DECLOMYCIN) 150 MG tablet Take 2 tablets twice a day Patient taking differently: Take 300 mg by mouth 2 (two) times daily.  07/09/19   Heilingoetter, Cassandra L, PA-C  diltiazem (CARDIZEM CD) 180 MG 24 hr capsule Take 1 capsule (180 mg total) by mouth daily at 12 noon. 01/09/15   Sherran Needs, NP  finasteride (PROSCAR) 5 MG tablet Take 1 tablet (5 mg total) by mouth daily. 01/10/20   Ghimire, Henreitta Leber, MD  fluticasone (FLONASE) 50 MCG/ACT nasal spray Place 2 sprays into both nostrils at bedtime.  10/17/19   [provider]  HYDROcodone-acetaminophen (NORCO/VICODIN) 5-325  MG tablet Take 1 tablet by mouth 3 (three) times daily as needed for moderate pain. 01/28/20   [name], MD  KLOR-CON M20 20 MEQ tablet Take 20 mEq by mouth every morning.  10/18/14   [provider]  Lactobacillus (ACIDOPHILUS) CAPS capsule Take 1 capsule by mouth daily.    [provider]  levothyroxine (SYNTHROID, LEVOTHROID) 50 MCG tablet TAKE 1 TABLET (50 MCG TOTAL) BY MOUTH DAILY BEFORE BREAKFAST. 03/24/18   [name], MD  MAGNESIUM PO Take 400 mg by mouth daily.     [provider]  mirtazapine (REMERON) 30 MG tablet TAKE 1 TABLET(30 MG) BY MOUTH AT BEDTIME Patient taking differently: Take 30 mg by mouth at bedtime.  11/28/19   [name], MD  Multiple Vitamin (MULTIVITAMIN WITH MINERALS) TABS Take 1 tablet by mouth daily.    [provider]  Nivolumab (OPDIVO IV) Inject as  directed every 30 (thirty) days. Receives 480mg  every 28 days.    [provider]  Nutritional Supplements (ENSURE COMPLETE PO) Take 1 Can by mouth 2 (two) times daily.    [provider]  Omega-3 Fatty Acids (FISH OIL PO) Take 1,000 mg by mouth daily.     [provider]  tamsulosin (FLOMAX) 0.4 MG CAPS capsule Take 1 capsule (0.4 mg total) by mouth 2 (two) times daily. 01/10/20   Ghimire, Henreitta Leber, MD  thiamine (VITAMIN B-1) 100 MG tablet Take 1 tablet (100 mg total) by mouth daily. 09/11/13   Erick Colace, NP  vitamin E 400 UNIT capsule Take 400 Units by mouth daily.    [provider]    Allergies    Patient has no known allergies.  Review of Systems   Review of Systems  Genitourinary: Positive for testicular pain.  All other systems reviewed and are negative.   Physical Exam Updated Vital Signs BP (!) 133/97 (BP Location: Right Arm)   Pulse (!) 121   Temp 98.5 F (36.9 C) (Oral)   Resp 16   SpO2 97%   Physical Exam Vitals and nursing note reviewed.   79 year old male, resting comfortably and in no acute distress. Vital signs are significant for elevated heart rate and blood pressure. Oxygen saturation is 97%, which is normal. Head is normocephalic and atraumatic. PERRLA, EOMI. Oropharynx is clear. Neck is nontender and supple without adenopathy or JVD. Back is nontender and there is no CVA tenderness. Lungs are clear without rales, wheezes, or rhonchi. Chest is nontender. Heart has regular rate and rhythm without murmur. Abdomen is soft, flat, nontender without masses or hepatosplenomegaly and peristalsis is normoactive.  Colostomy present left lower quadrant. Genitalia: Circumcised penis with Foley catheter in place.  Moderate erythema of the scrotum.  Right testicle is enlarged and indurated and very tender, left testicle is normal. Extremities have no cyanosis or edema, full range of motion is present. Skin is warm and dry without  rash. Neurologic: Mental status is normal, cranial nerves are intact, there are no motor or sensory deficits.  ED Results / Procedures / Treatments   Labs (all labs ordered are listed, but only abnormal results are displayed) Labs Reviewed  BASIC METABOLIC PANEL  CBC WITH DIFFERENTIAL/PLATELET  URINALYSIS, ROUTINE W REFLEX MICROSCOPIC    Radiology No results found.  Procedures Procedures  Medications Ordered in ED Medications  oxyCODONE-acetaminophen (PERCOCET/ROXICET) 5-325 MG per tablet 1 tablet (has no administration in time range)    ED Course  I have reviewed the triage vital  signs and the nursing notes.  Pertinent labs & imaging results that were available during my care of the patient were reviewed by me and considered in my medical decision making (see chart for details).  MDM Rules/Calculators/A&P Right testicular pain and swelling most consistent with epididymitis.  Consider orchitis.  Doubt testicular torsion.  Old records reviewed confirming recent hospitalization for diverticulitis with exploratory laparotomy and creation of colostomy.  He is given a dose of oxycodone-acetaminophen sent for testicular ultrasound.  Case is signed out to Dr. Jeanell Sparrow.  Final Clinical Impression(s) / ED Diagnoses Final diagnoses:  Epididymitis  Pain in right testicle  Testicular pain, right    Rx / DC Orders ED Discharge Orders    None       Delora Fuel, MD 65/53/74 [phone]

## 2020-01-30 NOTE — Discharge Instructions (Addendum)
Please elevate testicle.  Use cold therapy as needed. Recheck with your urologist today as scheduled. Return if worsening symptoms or fever.

## 2020-01-30 NOTE — ED Provider Notes (Signed)
-  year-old male presents today complaining of right testicular pain.  Patient seen and evaluated by Dr. Roxanne Mins who clinically thought the patient had epididymitis.  Particular ultrasound obtained.  Testicular ultrasound results right epididymoorchitis with moderate hydrocele. Patient started on Levaquin here and will prescribe outpatient Levaquin Patient has appointment with urologist for removal of Foley catheter this afternoon at 1:00.  He is advised to keep this appointment. Discussed results and plan with patient and voices understanding.   Pattricia Boss, MD 01/30/20 [phone]

## 2020-01-31 DIAGNOSIS — J1282 Pneumonia due to coronavirus disease 2019: Secondary | ICD-10-CM | POA: Diagnosis not present

## 2020-01-31 DIAGNOSIS — Z4801 Encounter for change or removal of surgical wound dressing: Secondary | ICD-10-CM | POA: Diagnosis not present

## 2020-01-31 DIAGNOSIS — Z48815 Encounter for surgical aftercare following surgery on the digestive system: Secondary | ICD-10-CM | POA: Diagnosis not present

## 2020-01-31 DIAGNOSIS — Z466 Encounter for fitting and adjustment of urinary device: Secondary | ICD-10-CM | POA: Diagnosis not present

## 2020-01-31 DIAGNOSIS — J9601 Acute respiratory failure with hypoxia: Secondary | ICD-10-CM | POA: Diagnosis not present

## 2020-01-31 DIAGNOSIS — U071 COVID-19: Secondary | ICD-10-CM | POA: Diagnosis not present

## 2020-02-03 DIAGNOSIS — Z466 Encounter for fitting and adjustment of urinary device: Secondary | ICD-10-CM | POA: Diagnosis not present

## 2020-02-03 DIAGNOSIS — J1282 Pneumonia due to coronavirus disease 2019: Secondary | ICD-10-CM | POA: Diagnosis not present

## 2020-02-03 DIAGNOSIS — U071 COVID-19: Secondary | ICD-10-CM | POA: Diagnosis not present

## 2020-02-03 DIAGNOSIS — Z48815 Encounter for surgical aftercare following surgery on the digestive system: Secondary | ICD-10-CM | POA: Diagnosis not present

## 2020-02-03 DIAGNOSIS — J9601 Acute respiratory failure with hypoxia: Secondary | ICD-10-CM | POA: Diagnosis not present

## 2020-02-03 DIAGNOSIS — Z4801 Encounter for change or removal of surgical wound dressing: Secondary | ICD-10-CM | POA: Diagnosis not present

## 2020-02-03 HISTORY — PX: OTHER SURGICAL HISTORY: SHX169

## 2020-02-04 ENCOUNTER — Telehealth (HOSPITAL_COMMUNITY): Payer: Self-pay

## 2020-02-04 NOTE — Telephone Encounter (Signed)

## 2020-02-05 DIAGNOSIS — H353112 Nonexudative age-related macular degeneration, right eye, intermediate dry stage: Secondary | ICD-10-CM | POA: Diagnosis not present

## 2020-02-06 ENCOUNTER — Encounter: Payer: Self-pay | Admitting: Vascular Surgery

## 2020-02-06 ENCOUNTER — Ambulatory Visit (INDEPENDENT_AMBULATORY_CARE_PROVIDER_SITE_OTHER): Payer: Medicare Other | Admitting: Vascular Surgery

## 2020-02-06 ENCOUNTER — Other Ambulatory Visit: Payer: Self-pay

## 2020-02-06 ENCOUNTER — Telehealth: Payer: Self-pay | Admitting: *Deleted

## 2020-02-06 VITALS — BP 133/70 | HR 90 | Temp 97.5°F | Resp 20 | Ht 71.0 in | Wt 134.0 lb

## 2020-02-06 DIAGNOSIS — I749 Embolism and thrombosis of unspecified artery: Secondary | ICD-10-CM

## 2020-02-06 NOTE — Telephone Encounter (Signed)
Received vm call from pt & returned call & he reports having a lot of phlegm/congestion.  He reports that he used to have sinus drainage in am & would usually cough & get up & then he was OK but now he does this & it doesn't go away.  He reports yellow secretions that he is coughing up & denies trouble breathing.  He is on an antibiotic-Levaquin now after being in hosp for diverticulitis & Covid.  He has a colostomy & as missed a couple of Opdivo treatments.  He is not taking any antihistamine/decongestants.  Message to Dr [name] for advice.  Pt would like a return call this pm.

## 2020-02-06 NOTE — Progress Notes (Signed)
Patient is an 79 year old male who returns for postoperative follow-up today.  He underwent left subclavian axillary brachial radial ulnar embolectomy on January 06, 2020.  This was thought to be cardiac source embolus.  However, he did have Covid at the time.  He is currently on Eliquis.  Physical exam:  Vitals:   02/06/20 1224  BP: 133/70  Pulse: 90  Resp: 20  Temp: (!) 97.5 F (36.4 C)  SpO2: 99%  Weight: 134 lb (60.8 kg)  Height: 5\' 11"  (1.803 m)    Left upper extremity: Well-healed antecubital incision 2+ radial pulse left hand pink and warm no neurologic deficit  Assessment: Doing well status post left upper extremity embolectomy.  Plan: Patient will follow-up with me on as-needed basis.  He should be kept on Eliquis indefinitely unless he has bleeding complications.  Ruta Hinds, MD [facility] Office: [phone]

## 2020-02-07 DIAGNOSIS — Z466 Encounter for fitting and adjustment of urinary device: Secondary | ICD-10-CM | POA: Diagnosis not present

## 2020-02-07 DIAGNOSIS — J9601 Acute respiratory failure with hypoxia: Secondary | ICD-10-CM | POA: Diagnosis not present

## 2020-02-07 DIAGNOSIS — Z4801 Encounter for change or removal of surgical wound dressing: Secondary | ICD-10-CM | POA: Diagnosis not present

## 2020-02-07 DIAGNOSIS — Z48815 Encounter for surgical aftercare following surgery on the digestive system: Secondary | ICD-10-CM | POA: Diagnosis not present

## 2020-02-07 DIAGNOSIS — J1282 Pneumonia due to coronavirus disease 2019: Secondary | ICD-10-CM | POA: Diagnosis not present

## 2020-02-07 DIAGNOSIS — U071 COVID-19: Secondary | ICD-10-CM | POA: Diagnosis not present

## 2020-02-10 DIAGNOSIS — Z4801 Encounter for change or removal of surgical wound dressing: Secondary | ICD-10-CM | POA: Diagnosis not present

## 2020-02-10 DIAGNOSIS — Z48815 Encounter for surgical aftercare following surgery on the digestive system: Secondary | ICD-10-CM | POA: Diagnosis not present

## 2020-02-10 DIAGNOSIS — J9601 Acute respiratory failure with hypoxia: Secondary | ICD-10-CM | POA: Diagnosis not present

## 2020-02-10 DIAGNOSIS — Z466 Encounter for fitting and adjustment of urinary device: Secondary | ICD-10-CM | POA: Diagnosis not present

## 2020-02-10 DIAGNOSIS — J1282 Pneumonia due to coronavirus disease 2019: Secondary | ICD-10-CM | POA: Diagnosis not present

## 2020-02-10 DIAGNOSIS — U071 COVID-19: Secondary | ICD-10-CM | POA: Diagnosis not present

## 2020-02-12 DIAGNOSIS — G620 Drug-induced polyneuropathy: Secondary | ICD-10-CM | POA: Diagnosis not present

## 2020-02-12 DIAGNOSIS — M19012 Primary osteoarthritis, left shoulder: Secondary | ICD-10-CM | POA: Diagnosis not present

## 2020-02-12 DIAGNOSIS — I10 Essential (primary) hypertension: Secondary | ICD-10-CM | POA: Diagnosis not present

## 2020-02-12 DIAGNOSIS — I480 Paroxysmal atrial fibrillation: Secondary | ICD-10-CM | POA: Diagnosis not present

## 2020-02-12 DIAGNOSIS — U071 COVID-19: Secondary | ICD-10-CM | POA: Diagnosis not present

## 2020-02-12 DIAGNOSIS — C3431 Malignant neoplasm of lower lobe, right bronchus or lung: Secondary | ICD-10-CM | POA: Diagnosis not present

## 2020-02-12 DIAGNOSIS — R338 Other retention of urine: Secondary | ICD-10-CM | POA: Diagnosis not present

## 2020-02-12 DIAGNOSIS — F419 Anxiety disorder, unspecified: Secondary | ICD-10-CM | POA: Diagnosis not present

## 2020-02-12 DIAGNOSIS — J439 Emphysema, unspecified: Secondary | ICD-10-CM | POA: Diagnosis not present

## 2020-02-12 DIAGNOSIS — D630 Anemia in neoplastic disease: Secondary | ICD-10-CM | POA: Diagnosis not present

## 2020-02-12 DIAGNOSIS — E039 Hypothyroidism, unspecified: Secondary | ICD-10-CM | POA: Diagnosis not present

## 2020-02-12 DIAGNOSIS — K219 Gastro-esophageal reflux disease without esophagitis: Secondary | ICD-10-CM | POA: Diagnosis not present

## 2020-02-12 DIAGNOSIS — Z48815 Encounter for surgical aftercare following surgery on the digestive system: Secondary | ICD-10-CM | POA: Diagnosis not present

## 2020-02-12 DIAGNOSIS — I82722 Chronic embolism and thrombosis of deep veins of left upper extremity: Secondary | ICD-10-CM | POA: Diagnosis not present

## 2020-02-12 DIAGNOSIS — Z466 Encounter for fitting and adjustment of urinary device: Secondary | ICD-10-CM | POA: Diagnosis not present

## 2020-02-12 DIAGNOSIS — Z4801 Encounter for change or removal of surgical wound dressing: Secondary | ICD-10-CM | POA: Diagnosis not present

## 2020-02-12 DIAGNOSIS — Z433 Encounter for attention to colostomy: Secondary | ICD-10-CM | POA: Diagnosis not present

## 2020-02-12 DIAGNOSIS — J1282 Pneumonia due to coronavirus disease 2019: Secondary | ICD-10-CM | POA: Diagnosis not present

## 2020-02-12 DIAGNOSIS — E222 Syndrome of inappropriate secretion of antidiuretic hormone: Secondary | ICD-10-CM | POA: Diagnosis not present

## 2020-02-12 DIAGNOSIS — F329 Major depressive disorder, single episode, unspecified: Secondary | ICD-10-CM | POA: Diagnosis not present

## 2020-02-12 DIAGNOSIS — I313 Pericardial effusion (noninflammatory): Secondary | ICD-10-CM | POA: Diagnosis not present

## 2020-02-12 DIAGNOSIS — N401 Enlarged prostate with lower urinary tract symptoms: Secondary | ICD-10-CM | POA: Diagnosis not present

## 2020-02-12 DIAGNOSIS — I700 Atherosclerosis of aorta: Secondary | ICD-10-CM | POA: Diagnosis not present

## 2020-02-12 DIAGNOSIS — T451X5S Adverse effect of antineoplastic and immunosuppressive drugs, sequela: Secondary | ICD-10-CM | POA: Diagnosis not present

## 2020-02-12 DIAGNOSIS — J9601 Acute respiratory failure with hypoxia: Secondary | ICD-10-CM | POA: Diagnosis not present

## 2020-02-13 ENCOUNTER — Telehealth: Payer: Self-pay | Admitting: Medical Oncology

## 2020-02-13 NOTE — Telephone Encounter (Addendum)
"  Phlegm in chest" . He is taking mucinex.  I could not leave a message to return his call .

## 2020-02-14 DIAGNOSIS — R338 Other retention of urine: Secondary | ICD-10-CM | POA: Diagnosis not present

## 2020-02-17 ENCOUNTER — Emergency Department (HOSPITAL_COMMUNITY): Payer: Medicare Other

## 2020-02-17 ENCOUNTER — Inpatient Hospital Stay (HOSPITAL_COMMUNITY)
Admission: EM | Admit: 2020-02-17 | Discharge: 2020-02-25 | DRG: 521 | Disposition: A | Discharge status: Skilled Nursing Facility | Payer: Medicare Other | Attending: Internal Medicine | Admitting: Internal Medicine

## 2020-02-17 ENCOUNTER — Other Ambulatory Visit: Payer: Self-pay

## 2020-02-17 ENCOUNTER — Encounter (HOSPITAL_COMMUNITY): Payer: Self-pay | Admitting: Internal Medicine

## 2020-02-17 DIAGNOSIS — E871 Hypo-osmolality and hyponatremia: Secondary | ICD-10-CM | POA: Diagnosis present

## 2020-02-17 DIAGNOSIS — S72042S Displaced fracture of base of neck of left femur, sequela: Secondary | ICD-10-CM | POA: Diagnosis not present

## 2020-02-17 DIAGNOSIS — Z933 Colostomy status: Secondary | ICD-10-CM | POA: Diagnosis not present

## 2020-02-17 DIAGNOSIS — J690 Pneumonitis due to inhalation of food and vomit: Secondary | ICD-10-CM | POA: Diagnosis not present

## 2020-02-17 DIAGNOSIS — Z792 Long term (current) use of antibiotics: Secondary | ICD-10-CM

## 2020-02-17 DIAGNOSIS — I482 Chronic atrial fibrillation, unspecified: Secondary | ICD-10-CM | POA: Diagnosis not present

## 2020-02-17 DIAGNOSIS — Z9889 Other specified postprocedural states: Secondary | ICD-10-CM | POA: Diagnosis not present

## 2020-02-17 DIAGNOSIS — Z79899 Other long term (current) drug therapy: Secondary | ICD-10-CM | POA: Diagnosis not present

## 2020-02-17 DIAGNOSIS — R262 Difficulty in walking, not elsewhere classified: Secondary | ICD-10-CM | POA: Diagnosis not present

## 2020-02-17 DIAGNOSIS — J449 Chronic obstructive pulmonary disease, unspecified: Secondary | ICD-10-CM | POA: Diagnosis not present

## 2020-02-17 DIAGNOSIS — I959 Hypotension, unspecified: Secondary | ICD-10-CM | POA: Diagnosis not present

## 2020-02-17 DIAGNOSIS — I313 Pericardial effusion (noninflammatory): Secondary | ICD-10-CM | POA: Diagnosis present

## 2020-02-17 DIAGNOSIS — S72002A Fracture of unspecified part of neck of left femur, initial encounter for closed fracture: Secondary | ICD-10-CM | POA: Diagnosis not present

## 2020-02-17 DIAGNOSIS — K219 Gastro-esophageal reflux disease without esophagitis: Secondary | ICD-10-CM | POA: Diagnosis present

## 2020-02-17 DIAGNOSIS — G629 Polyneuropathy, unspecified: Secondary | ICD-10-CM | POA: Diagnosis present

## 2020-02-17 DIAGNOSIS — Z79890 Hormone replacement therapy (postmenopausal): Secondary | ICD-10-CM | POA: Diagnosis not present

## 2020-02-17 DIAGNOSIS — E222 Syndrome of inappropriate secretion of antidiuretic hormone: Secondary | ICD-10-CM | POA: Diagnosis present

## 2020-02-17 DIAGNOSIS — I3139 Other pericardial effusion (noninflammatory): Secondary | ICD-10-CM

## 2020-02-17 DIAGNOSIS — I4819 Other persistent atrial fibrillation: Secondary | ICD-10-CM | POA: Diagnosis not present

## 2020-02-17 DIAGNOSIS — Z801 Family history of malignant neoplasm of trachea, bronchus and lung: Secondary | ICD-10-CM

## 2020-02-17 DIAGNOSIS — Z8616 Personal history of COVID-19: Secondary | ICD-10-CM

## 2020-02-17 DIAGNOSIS — I440 Atrioventricular block, first degree: Secondary | ICD-10-CM | POA: Diagnosis present

## 2020-02-17 DIAGNOSIS — C3431 Malignant neoplasm of lower lobe, right bronchus or lung: Secondary | ICD-10-CM | POA: Diagnosis present

## 2020-02-17 DIAGNOSIS — W19XXXS Unspecified fall, sequela: Secondary | ICD-10-CM | POA: Diagnosis not present

## 2020-02-17 DIAGNOSIS — Z808 Family history of malignant neoplasm of other organs or systems: Secondary | ICD-10-CM

## 2020-02-17 DIAGNOSIS — E440 Moderate protein-calorie malnutrition: Secondary | ICD-10-CM | POA: Diagnosis present

## 2020-02-17 DIAGNOSIS — Z01810 Encounter for preprocedural cardiovascular examination: Secondary | ICD-10-CM | POA: Diagnosis not present

## 2020-02-17 DIAGNOSIS — Z471 Aftercare following joint replacement surgery: Secondary | ICD-10-CM | POA: Diagnosis not present

## 2020-02-17 DIAGNOSIS — E875 Hyperkalemia: Secondary | ICD-10-CM | POA: Diagnosis not present

## 2020-02-17 DIAGNOSIS — Z4789 Encounter for other orthopedic aftercare: Secondary | ICD-10-CM | POA: Diagnosis not present

## 2020-02-17 DIAGNOSIS — N400 Enlarged prostate without lower urinary tract symptoms: Secondary | ICD-10-CM | POA: Diagnosis not present

## 2020-02-17 DIAGNOSIS — N401 Enlarged prostate with lower urinary tract symptoms: Secondary | ICD-10-CM | POA: Diagnosis present

## 2020-02-17 DIAGNOSIS — R338 Other retention of urine: Secondary | ICD-10-CM | POA: Diagnosis present

## 2020-02-17 DIAGNOSIS — W19XXXA Unspecified fall, initial encounter: Secondary | ICD-10-CM

## 2020-02-17 DIAGNOSIS — Z433 Encounter for attention to colostomy: Secondary | ICD-10-CM | POA: Diagnosis not present

## 2020-02-17 DIAGNOSIS — J439 Emphysema, unspecified: Secondary | ICD-10-CM | POA: Diagnosis present

## 2020-02-17 DIAGNOSIS — F411 Generalized anxiety disorder: Secondary | ICD-10-CM | POA: Diagnosis not present

## 2020-02-17 DIAGNOSIS — M2550 Pain in unspecified joint: Secondary | ICD-10-CM | POA: Diagnosis not present

## 2020-02-17 DIAGNOSIS — D62 Acute posthemorrhagic anemia: Secondary | ICD-10-CM | POA: Diagnosis not present

## 2020-02-17 DIAGNOSIS — R7981 Abnormal blood-gas level: Secondary | ICD-10-CM | POA: Diagnosis not present

## 2020-02-17 DIAGNOSIS — Z96612 Presence of left artificial shoulder joint: Secondary | ICD-10-CM | POA: Diagnosis present

## 2020-02-17 DIAGNOSIS — R0902 Hypoxemia: Secondary | ICD-10-CM

## 2020-02-17 DIAGNOSIS — Z741 Need for assistance with personal care: Secondary | ICD-10-CM | POA: Diagnosis not present

## 2020-02-17 DIAGNOSIS — E46 Unspecified protein-calorie malnutrition: Secondary | ICD-10-CM | POA: Diagnosis not present

## 2020-02-17 DIAGNOSIS — Z9049 Acquired absence of other specified parts of digestive tract: Secondary | ICD-10-CM

## 2020-02-17 DIAGNOSIS — K5792 Diverticulitis of intestine, part unspecified, without perforation or abscess without bleeding: Secondary | ICD-10-CM | POA: Diagnosis not present

## 2020-02-17 DIAGNOSIS — Z923 Personal history of irradiation: Secondary | ICD-10-CM

## 2020-02-17 DIAGNOSIS — R0603 Acute respiratory distress: Secondary | ICD-10-CM | POA: Diagnosis not present

## 2020-02-17 DIAGNOSIS — Z8781 Personal history of (healed) traumatic fracture: Secondary | ICD-10-CM | POA: Diagnosis not present

## 2020-02-17 DIAGNOSIS — Z8249 Family history of ischemic heart disease and other diseases of the circulatory system: Secondary | ICD-10-CM

## 2020-02-17 DIAGNOSIS — R918 Other nonspecific abnormal finding of lung field: Secondary | ICD-10-CM | POA: Diagnosis not present

## 2020-02-17 DIAGNOSIS — R5381 Other malaise: Secondary | ICD-10-CM

## 2020-02-17 DIAGNOSIS — I480 Paroxysmal atrial fibrillation: Secondary | ICD-10-CM | POA: Diagnosis present

## 2020-02-17 DIAGNOSIS — W010XXA Fall on same level from slipping, tripping and stumbling without subsequent striking against object, initial encounter: Secondary | ICD-10-CM | POA: Diagnosis present

## 2020-02-17 DIAGNOSIS — I749 Embolism and thrombosis of unspecified artery: Secondary | ICD-10-CM | POA: Diagnosis not present

## 2020-02-17 DIAGNOSIS — R1312 Dysphagia, oropharyngeal phase: Secondary | ICD-10-CM | POA: Diagnosis not present

## 2020-02-17 DIAGNOSIS — E8809 Other disorders of plasma-protein metabolism, not elsewhere classified: Secondary | ICD-10-CM | POA: Diagnosis present

## 2020-02-17 DIAGNOSIS — E039 Hypothyroidism, unspecified: Secondary | ICD-10-CM | POA: Diagnosis present

## 2020-02-17 DIAGNOSIS — M8788 Other osteonecrosis, other site: Secondary | ICD-10-CM | POA: Diagnosis not present

## 2020-02-17 DIAGNOSIS — I4891 Unspecified atrial fibrillation: Secondary | ICD-10-CM | POA: Diagnosis present

## 2020-02-17 DIAGNOSIS — Z87891 Personal history of nicotine dependence: Secondary | ICD-10-CM

## 2020-02-17 DIAGNOSIS — R05 Cough: Secondary | ICD-10-CM | POA: Diagnosis not present

## 2020-02-17 DIAGNOSIS — Z96642 Presence of left artificial hip joint: Secondary | ICD-10-CM | POA: Diagnosis not present

## 2020-02-17 DIAGNOSIS — U071 COVID-19: Secondary | ICD-10-CM | POA: Diagnosis not present

## 2020-02-17 DIAGNOSIS — R52 Pain, unspecified: Secondary | ICD-10-CM | POA: Diagnosis not present

## 2020-02-17 DIAGNOSIS — Z7901 Long term (current) use of anticoagulants: Secondary | ICD-10-CM

## 2020-02-17 DIAGNOSIS — S72009A Fracture of unspecified part of neck of unspecified femur, initial encounter for closed fracture: Secondary | ICD-10-CM | POA: Diagnosis present

## 2020-02-17 DIAGNOSIS — I10 Essential (primary) hypertension: Secondary | ICD-10-CM | POA: Diagnosis present

## 2020-02-17 DIAGNOSIS — M6281 Muscle weakness (generalized): Secondary | ICD-10-CM | POA: Diagnosis not present

## 2020-02-17 DIAGNOSIS — Z681 Body mass index (BMI) 19 or less, adult: Secondary | ICD-10-CM

## 2020-02-17 DIAGNOSIS — R0600 Dyspnea, unspecified: Secondary | ICD-10-CM

## 2020-02-17 DIAGNOSIS — J189 Pneumonia, unspecified organism: Secondary | ICD-10-CM | POA: Diagnosis not present

## 2020-02-17 DIAGNOSIS — Z96649 Presence of unspecified artificial hip joint: Secondary | ICD-10-CM | POA: Diagnosis not present

## 2020-02-17 DIAGNOSIS — R41841 Cognitive communication deficit: Secondary | ICD-10-CM | POA: Diagnosis not present

## 2020-02-17 DIAGNOSIS — C3490 Malignant neoplasm of unspecified part of unspecified bronchus or lung: Secondary | ICD-10-CM | POA: Diagnosis not present

## 2020-02-17 DIAGNOSIS — Z9221 Personal history of antineoplastic chemotherapy: Secondary | ICD-10-CM

## 2020-02-17 DIAGNOSIS — M80852A Other osteoporosis with current pathological fracture, left femur, initial encounter for fracture: Principal | ICD-10-CM | POA: Diagnosis present

## 2020-02-17 DIAGNOSIS — J1282 Pneumonia due to coronavirus disease 2019: Secondary | ICD-10-CM | POA: Diagnosis not present

## 2020-02-17 DIAGNOSIS — R0602 Shortness of breath: Secondary | ICD-10-CM | POA: Diagnosis not present

## 2020-02-17 DIAGNOSIS — Z833 Family history of diabetes mellitus: Secondary | ICD-10-CM

## 2020-02-17 DIAGNOSIS — F339 Major depressive disorder, recurrent, unspecified: Secondary | ICD-10-CM | POA: Diagnosis not present

## 2020-02-17 DIAGNOSIS — Z7401 Bed confinement status: Secondary | ICD-10-CM | POA: Diagnosis not present

## 2020-02-17 DIAGNOSIS — R339 Retention of urine, unspecified: Secondary | ICD-10-CM | POA: Diagnosis not present

## 2020-02-17 LAB — CBC WITH DIFFERENTIAL/PLATELET
Abs Immature Granulocytes: 0.06 10*3/uL (ref 0.00–0.07)
Basophils Absolute: 0.1 10*3/uL (ref 0.0–0.1)
Basophils Relative: 1 %
Eosinophils Absolute: 0.2 10*3/uL (ref 0.0–0.5)
Eosinophils Relative: 3 %
HCT: 42.9 % (ref 39.0–52.0)
Hemoglobin: 14.2 g/dL (ref 13.0–17.0)
Immature Granulocytes: 1 %
Lymphocytes Relative: 14 %
Lymphs Abs: 1.2 10*3/uL (ref 0.7–4.0)
MCH: 29.7 pg (ref 26.0–34.0)
MCHC: 33.1 g/dL (ref 30.0–36.0)
MCV: 89.7 fL (ref 80.0–100.0)
Monocytes Absolute: 0.7 10*3/uL (ref 0.1–1.0)
Monocytes Relative: 8 %
Neutro Abs: 6.1 10*3/uL (ref 1.7–7.7)
Neutrophils Relative %: 73 %
Platelets: 428 10*3/uL — ABNORMAL HIGH (ref 150–400)
RBC: 4.78 MIL/uL (ref 4.22–5.81)
RDW: 14.6 % (ref 11.5–15.5)
WBC: 8.3 10*3/uL (ref 4.0–10.5)
nRBC: 0 % (ref 0.0–0.2)

## 2020-02-17 LAB — COMPREHENSIVE METABOLIC PANEL
ALT: 24 U/L (ref 0–44)
AST: 27 U/L (ref 15–41)
Albumin: 3.7 g/dL (ref 3.5–5.0)
Alkaline Phosphatase: 123 U/L (ref 38–126)
Anion gap: 9 (ref 5–15)
BUN: 18 mg/dL (ref 8–23)
CO2: 27 mmol/L (ref 22–32)
Calcium: 9.4 mg/dL (ref 8.9–10.3)
Chloride: 96 mmol/L — ABNORMAL LOW (ref 98–111)
Creatinine, Ser: 0.68 mg/dL (ref 0.61–1.24)
GFR calc Af Amer: >60 mL/min (ref >60)
GFR calc non Af Amer: >60 mL/min (ref >60)
Glucose, Bld: 96 mg/dL (ref 70–99)
Potassium: 4.3 mmol/L (ref 3.5–5.1)
Sodium: 132 mmol/L — ABNORMAL LOW (ref 135–145)
Total Bilirubin: 0.6 mg/dL (ref 0.3–1.2)
Total Protein: 6.5 g/dL (ref 6.5–8.1)

## 2020-02-17 LAB — HEPARIN LEVEL (UNFRACTIONATED): Heparin Unfractionated: 1.8 IU/mL — ABNORMAL HIGH (ref 0.30–0.70)

## 2020-02-17 LAB — APTT: aPTT: 35 seconds (ref 24–36)

## 2020-02-17 LAB — TYPE AND SCREEN
ABO/RH(D): O POS
Antibody Screen: NEGATIVE

## 2020-02-17 LAB — PROTIME-INR
INR: 1.1 (ref 0.8–1.2)
Prothrombin Time: 14.1 seconds (ref 11.4–15.2)

## 2020-02-17 MED ORDER — CALCIUM CARBONATE 600 MG PO TABS: 600.0000 mg | ORAL_TABLET | Freq: Every day | ORAL | Status: DC

## 2020-02-17 MED ORDER — CEFAZOLIN SODIUM-DEXTROSE 2-4 GM/100ML-% IV SOLN
2.0000 g | INTRAVENOUS | Status: AC
  Administered 2020-02-18: 2 g via INTRAVENOUS
  Filled 2020-02-17: qty 100

## 2020-02-17 MED ORDER — HEPARIN (PORCINE) 25000 UT/250ML-% IV SOLN
1000.0000 [IU]/h | INTRAVENOUS | Status: DC
  Administered 2020-02-17: 1000 [IU]/h via INTRAVENOUS
  Filled 2020-02-17: qty 250

## 2020-02-17 MED ORDER — VITAMIN E 180 MG (400 UNIT) PO CAPS: 400.0000 [IU] | ORAL_CAPSULE | Freq: Every day | ORAL | Status: DC

## 2020-02-17 MED ORDER — ALPRAZOLAM 0.5 MG PO TABS
0.5000 mg | ORAL_TABLET | Freq: Every evening | ORAL | Status: DC | PRN
  Administered 2020-02-19 – 2020-02-22 (×3): 0.5 mg via ORAL
  Filled 2020-02-17 (×4): qty 1

## 2020-02-17 MED ORDER — MAGNESIUM OXIDE 400 (241.3 MG) MG PO TABS
400.0000 mg | ORAL_TABLET | Freq: Every day | ORAL | Status: DC
  Administered 2020-02-19 – 2020-02-25 (×7): 400 mg via ORAL
  Filled 2020-02-17 (×7): qty 1

## 2020-02-17 MED ORDER — DILTIAZEM HCL ER COATED BEADS 180 MG PO CP24
180.0000 mg | ORAL_CAPSULE | Freq: Every day | ORAL | Status: DC
  Administered 2020-02-19 – 2020-02-25 (×7): 180 mg via ORAL
  Filled 2020-02-17 (×8): qty 1

## 2020-02-17 MED ORDER — ONDANSETRON HCL 4 MG PO TABS: 4.0000 mg | ORAL_TABLET | Freq: Four times a day (QID) | ORAL | Status: DC | PRN

## 2020-02-17 MED ORDER — GUAIFENESIN ER 600 MG PO TB12
600.0000 mg | ORAL_TABLET | Freq: Two times a day (BID) | ORAL | Status: DC
  Administered 2020-02-17 – 2020-02-25 (×15): 600 mg via ORAL
  Filled 2020-02-17 (×15): qty 1

## 2020-02-17 MED ORDER — FENTANYL CITRATE (PF) 100 MCG/2ML IJ SOLN
25.0000 ug | INTRAMUSCULAR | Status: DC | PRN
  Administered 2020-02-17: 25 ug via INTRAVENOUS
  Filled 2020-02-17: qty 2

## 2020-02-17 MED ORDER — ADULT MULTIVITAMIN W/MINERALS CH
1.0000 | ORAL_TABLET | Freq: Every day | ORAL | Status: DC
  Administered 2020-02-20 – 2020-02-25 (×6): 1 via ORAL
  Filled 2020-02-17 (×6): qty 1

## 2020-02-17 MED ORDER — ENSURE ENLIVE PO LIQD
237.0000 mL | Freq: Two times a day (BID) | ORAL | Status: DC
  Administered 2020-02-17: 237 mL via ORAL
  Filled 2020-02-17: qty 237

## 2020-02-17 MED ORDER — FENTANYL CITRATE (PF) 100 MCG/2ML IJ SOLN
25.0000 ug | INTRAMUSCULAR | Status: DC | PRN
  Administered 2020-02-17 – 2020-02-18 (×3): 50 ug via INTRAVENOUS
  Filled 2020-02-17 (×3): qty 2

## 2020-02-17 MED ORDER — BENZONATATE 100 MG PO CAPS
200.0000 mg | ORAL_CAPSULE | Freq: Three times a day (TID) | ORAL | Status: DC | PRN
  Administered 2020-02-19 – 2020-02-24 (×5): 200 mg via ORAL
  Filled 2020-02-17 (×6): qty 2

## 2020-02-17 MED ORDER — CHLORHEXIDINE GLUCONATE 4 % EX LIQD
60.0000 mL | Freq: Once | CUTANEOUS | Status: AC
  Administered 2020-02-18: 4 via TOPICAL
  Filled 2020-02-17: qty 60

## 2020-02-17 MED ORDER — METHOCARBAMOL 1000 MG/10ML IJ SOLN
500.0000 mg | Freq: Four times a day (QID) | INTRAVENOUS | Status: DC | PRN
  Administered 2020-02-17: 500 mg via INTRAVENOUS
  Filled 2020-02-17 (×2): qty 5

## 2020-02-17 MED ORDER — TAMSULOSIN HCL 0.4 MG PO CAPS
0.4000 mg | ORAL_CAPSULE | Freq: Two times a day (BID) | ORAL | Status: DC
  Administered 2020-02-17 – 2020-02-25 (×15): 0.4 mg via ORAL
  Filled 2020-02-17 (×15): qty 1

## 2020-02-17 MED ORDER — IPRATROPIUM-ALBUTEROL 0.5-2.5 (3) MG/3ML IN SOLN: 3.0000 mL | Freq: Three times a day (TID) | RESPIRATORY_TRACT | Status: DC

## 2020-02-17 MED ORDER — HYDROCODONE-ACETAMINOPHEN 5-325 MG PO TABS
1.0000 | ORAL_TABLET | Freq: Three times a day (TID) | ORAL | Status: DC | PRN
  Administered 2020-02-17 – 2020-02-18 (×2): 1 via ORAL
  Filled 2020-02-17 (×2): qty 1

## 2020-02-17 MED ORDER — ASCORBIC ACID 500 MG PO TABS: 1000.0000 mg | ORAL_TABLET | Freq: Every day | ORAL | Status: DC

## 2020-02-17 MED ORDER — THIAMINE HCL 100 MG PO TABS
100.0000 mg | ORAL_TABLET | Freq: Every day | ORAL | Status: DC
  Administered 2020-02-19 – 2020-02-25 (×7): 100 mg via ORAL
  Filled 2020-02-17 (×7): qty 1

## 2020-02-17 MED ORDER — OMEGA-3-ACID ETHYL ESTERS 1 G PO CAPS
1.0000 g | ORAL_CAPSULE | Freq: Every day | ORAL | Status: DC
  Administered 2020-02-19 – 2020-02-25 (×7): 1 g via ORAL
  Filled 2020-02-17 (×7): qty 1

## 2020-02-17 MED ORDER — FLUTICASONE PROPIONATE 50 MCG/ACT NA SUSP
2.0000 | Freq: Every day | NASAL | Status: DC
  Administered 2020-02-17 – 2020-02-24 (×7): 2 via NASAL
  Filled 2020-02-17: qty 16

## 2020-02-17 MED ORDER — POTASSIUM CHLORIDE CRYS ER 20 MEQ PO TBCR
20.0000 meq | EXTENDED_RELEASE_TABLET | Freq: Every day | ORAL | Status: DC
  Administered 2020-02-19 – 2020-02-20 (×2): 20 meq via ORAL
  Filled 2020-02-17 (×2): qty 1

## 2020-02-17 MED ORDER — FENTANYL CITRATE (PF) 100 MCG/2ML IJ SOLN: 25.0000 ug | INTRAMUSCULAR | Status: DC | PRN

## 2020-02-17 MED ORDER — ENSURE PRE-SURGERY PO LIQD
296.0000 mL | Freq: Once | ORAL | Status: AC
  Administered 2020-02-18: 296 mL via ORAL
  Filled 2020-02-17: qty 296

## 2020-02-17 MED ORDER — DEMECLOCYCLINE HCL 150 MG PO TABS
300.0000 mg | ORAL_TABLET | Freq: Two times a day (BID) | ORAL | Status: DC
  Administered 2020-02-17 – 2020-02-25 (×15): 300 mg via ORAL
  Filled 2020-02-17 (×18): qty 2

## 2020-02-17 MED ORDER — LEVOTHYROXINE SODIUM 50 MCG PO TABS
50.0000 ug | ORAL_TABLET | Freq: Every day | ORAL | Status: DC
  Administered 2020-02-18 – 2020-02-25 (×8): 50 ug via ORAL
  Filled 2020-02-17 (×8): qty 1

## 2020-02-17 MED ORDER — RISAQUAD PO CAPS
1.0000 | ORAL_CAPSULE | Freq: Every day | ORAL | Status: DC
  Administered 2020-02-19 – 2020-02-25 (×7): 1 via ORAL
  Filled 2020-02-17 (×7): qty 1

## 2020-02-17 MED ORDER — FINASTERIDE 5 MG PO TABS: 5.0000 mg | ORAL_TABLET | Freq: Every day | ORAL | Status: DC

## 2020-02-17 MED ORDER — ACETAMINOPHEN 500 MG PO TABS
1000.0000 mg | ORAL_TABLET | Freq: Once | ORAL | Status: AC
  Administered 2020-02-18: 1000 mg via ORAL
  Filled 2020-02-17: qty 2

## 2020-02-17 MED ORDER — MIRTAZAPINE 15 MG PO TABS
30.0000 mg | ORAL_TABLET | Freq: Every day | ORAL | Status: DC
  Administered 2020-02-17 – 2020-02-23 (×6): 30 mg via ORAL
  Filled 2020-02-17 (×9): qty 2

## 2020-02-17 MED ORDER — IPRATROPIUM-ALBUTEROL 0.5-2.5 (3) MG/3ML IN SOLN: 3.0000 mL | RESPIRATORY_TRACT | Status: DC | PRN

## 2020-02-17 MED ORDER — ONDANSETRON HCL 4 MG/2ML IJ SOLN: 4.0000 mg | Freq: Four times a day (QID) | INTRAMUSCULAR | Status: DC | PRN

## 2020-02-17 NOTE — ED Notes (Signed)
Attempted to call report x 1. Receiving floor states this patient is not approved as of yet. Will call back.

## 2020-02-17 NOTE — ED Triage Notes (Signed)
79 yo male from home s/p mechanical fall while standing up from seated position in walker. Denies head injury. No LOC. Denies head/neck/back pain. C/O left hip/leg pain. No shortening or rotation per EMS. Pain increases with movement. Pt is on Eliquis.

## 2020-02-17 NOTE — Consult Note (Signed)
ORTHOPAEDIC CONSULTATION  REQUESTING PHYSICIAN: Pattricia Boss, MD  Chief Complaint: left hip pain  HPI: Anthony Curry. is a 79 y.o. 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 He was recently hospitalized 1/28-01/10/20 for perforated diverticulitis and underwent a sigmoid colectomy with colostomy creation. During that stay he also was found to have an ischemic left hand requiring left brachial embolectomy on 2/1 for which he is currently on Eliquis. Last dose of Eliquis was taken this morning.  Today he presented to ED with complaint of left hip pain after a fall this morning. He got up off of the sofa, lost his balance and fell onto his left side. Patient states he is in constant severe pain, located at his left hip and groin, which is worse with any movement at hip and especially with coughing. Patient denies left hip pain prior to fall. X-rays were taken which showed a left femoral neck fracture and orthopedics was consulted.   Past Medical History:  Diagnosis Date  . 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 Adrenal mass ([place])   . Alcoholism in remission ([place])   . Anemia of chronic disease   . 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 Anxiety   . BPH (benign prostatic hyperplasia)   . Cancer ([place])    small cell/lung  . Complication of anesthesia 09/08/2015    JITTERY AFTER GALLBLADDER SURGERY  . GERD (gastroesophageal reflux disease)   . History of radiation therapy 11/14/16, 11/18/16, 11/22/16   Left upper lung: 54 Gy in 3 fractions  . Hypertension   . Neoplastic malignant related fatigue    IV tx. every 2 weeks last9-14-16 (Dr. Earlie Server), radiation last tx. 6 months  . 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 Pericardial effusion   . Persistent atrial fibrillation ([place])   . Pulmonary nodules   . Radiation 12/02/14-12/16/14    Left adrenal gland 30 Gy in 10 fractions   Past Surgical History:  Procedure Laterality Date  . ANKLE ARTHRODESIS  1995   rt fx-hardware in  . CARDIOVERSION N/A 09/29/2014   Procedure: CARDIOVERSION;  Surgeon: Josue Hector, MD;  Location: [facility] ENDOSCOPY;  Service: Cardiovascular;  Laterality: N/A;  . CHOLECYSTECTOMY  09/08/2015    laproscopic   . CHOLECYSTECTOMY N/A 09/08/2015   Procedure: LAPAROSCOPIC CHOLECYSTECTOMY WITH ATTEMPTED INTRAOPERATIVE CHOLANGIOGRAM ;  Surgeon: Fanny Skates, MD;  Location: [facility];  Service: General;  Laterality: N/A;  . COLONOSCOPY    . EMBOLECTOMY Left 01/06/2020   Procedure: Embolectomy left axillary, brachial, radial, and ulnar artery;  Surgeon: Elam Dutch, MD;  Location: [facility] OR;  Service: Vascular;  Laterality: Left;  . ERCP N/A 09/02/2015   Procedure: ENDOSCOPIC RETROGRADE CHOLANGIOPANCREATOGRAPHY (ERCP);  Surgeon: Arta Silence, MD;  Location: [facility];  Service: Endoscopy;  Laterality: N/A;  . ERCP N/A 09/07/2015   Procedure: ENDOSCOPIC RETROGRADE CHOLANGIOPANCREATOGRAPHY (ERCP);  Surgeon: Clarene Essex, MD;  Location: [facility];  Service: Endoscopy;  Laterality: N/A;  . EXCISION / CURETTAGE BONE CYST FINGER  2005   rt thumb  . HEMORRHOID SURGERY    . LAPAROTOMY N/A 01/05/2020   Procedure: EXPLORATORY LAPAROTOMY sigmoid colon resection creation end colostomy for perforated diverticulitis and purulent peritonitis;  Surgeon: Kieth Brightly, Arta Bruce, MD;  Location: [facility] ORS;  Service: General;  Laterality: N/A;  . Percutaneous Cholecytostomy tube     IVR insertion 06-13-15(Dr. Vernard Gambles)- 08-28-15 remains in place to  drainage bag  . TONSILLECTOMY    . TOTAL SHOULDER ARTHROPLASTY  02/03/2012   Procedure: TOTAL SHOULDER ARTHROPLASTY;  Surgeon: [name], MD;  Location: [facility];  Service: Orthopedics;  Laterality: Left;   Social History   Socioeconomic History  . Marital status: Married    Spouse name: Anthony Curry  . Number of  children: 3  . Years of education: 33  . Highest education level: Not on file  Occupational History  . 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 Smoking status: Former Smoker    Packs/day: 2.00    Years: 30.00    Pack years: 60.00    Types: Cigarettes    Quit date: 12/05/1990    Years since quitting: 29.2  . Smokeless tobacco: Never Used  Substance and Sexual Activity  . Alcohol use: Yes    Alcohol/week: 0.0 standard drinks    Comment: past hx.ETOH abuse,08-31-15 "occ. wine ,drinks non alcoholic beer occ."  . Drug use: No  . Sexual activity: Not Currently  Other Topics Concern  . Not on file  Social History Narrative   Lives at home with wife.   Caffeine use: Drinks coffee- 2 cups per day   Retired from [employer] Strain:   . Difficulty of Paying Living Expenses:   Food Insecurity:   . Worried About Charity fundraiser in the Last Year:   . Arboriculturist in the Last Year:   Transportation Needs:   . Film/video editor (Medical):   [name] Lack of Transportation (Non-Medical):   Physical Activity:   . Days of Exercise per Week:   . Minutes of Exercise per Session:   Stress:   . Feeling of Stress :   Social Connections:   . Frequency of Communication with Friends and Family:   . Frequency of Social Gatherings with Friends and Family:   . Attends Religious Services:   . Active Member of Clubs or Organizations:   . Attends Archivist Meetings:   [name] Marital Status:    Family History  Problem Relation Age of Onset  . Lung cancer Mother   . Hypertension Father   . Heart attack Father   . Diabetes Paternal Grandmother   . Brain cancer Brother   . Heart attack Brother   . Neuropathy Neg Hx    No Known Allergies   Positive ROS: All other systems have been reviewed and were otherwise negative with the exception of those mentioned in the HPI and as  above.  Physical Exam: General: Alert, no acute distress. Cardiovascular: No pedal edema Respiratory: No cyanosis, no use of accessory musculature GI: No organomegaly, abdomen is soft and non-tender. Colostomy bad in place. Skin: No lesions noted over left hip, knee, or ankle. Neurologic: Sensation intact distally Psychiatric: Patient is competent for consent with normal mood and affect Lymphatic: No axillary or cervical lymphadenopathy  MUSCULOSKELETAL: LLE externally rotated and shortened. TTP to left groin and hip. No TTP to left knee or ankle. 2+ DP pulse. Distal sensation intact. Able to move all toes of left foot. .   Assessment: Principal Problem:   Left displaced femoral neck fracture (HCC)  Plan: - patient currently on Eliquis, last dose taken this morning. Hold Eliquis in anticipation of surgical intervention - plan for left hip hemiarthroplasty with Dr. Mardelle Matte on 3/18  - patient will  be npo after midnight day of surgery    Ventura Bruns, PA-C Cell [phone]   02/17/2020 3:15 PM

## 2020-02-17 NOTE — ED Provider Notes (Signed)
[facility] DEPT Provider Note   CSN: [id number] Arrival date & time: 02/17/20  1205     History Chief Complaint  Patient presents with  . Fall    Anthony Curry. is a 79 y.o. male.  HPI 79 year old male history of diverticulitis, status post diverting colostomy, presents today with fall.  He states that sounds at the door and he went to get up of the sofa and lost his balance.[name]  He is complaining of left hip pain.  He denies other injury.  Patient is on Eliquis.   Covid positive test January 27 Past Medical History:  Diagnosis Date  . 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 Adrenal mass ([place])   . Alcoholism in remission ([place])   . Anemia of chronic disease   . 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 Anxiety   . BPH (benign prostatic hyperplasia)   . Cancer ([place])    small cell/lung  . Complication of anesthesia 09/08/2015    JITTERY AFTER GALLBLADDER SURGERY  . GERD (gastroesophageal reflux disease)   . History of radiation therapy 11/14/16, 11/18/16, 11/22/16   Left upper lung: 54 Gy in 3 fractions  . Hypertension   . Neoplastic malignant related fatigue    IV tx. every 2 weeks last9-14-16 (Dr. Earlie Server), radiation last tx. 6 months  . 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 Pericardial effusion   . Persistent atrial fibrillation ([place])   . Pulmonary nodules   . Radiation 12/02/14-12/16/14   Left adrenal gland 30 Gy in 10 fractions    Patient Active Problem List   Diagnosis Date Noted  . Diverticulitis 01/02/2020  . Acute diverticulitis 01/02/2020  . Ataxia 06/30/2019  . SIADH (syndrome of inappropriate ADH production) ([place]) 06/30/2019  . Generalized weakness 06/28/2019  . Lactic acidosis 06/28/2019  . Abnormal urinalysis 06/28/2019  . Hypertension 12/07/2016  . 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 Acute urinary retention 09/09/2015  . Choledocholithiasis with cholecystitis 09/08/2015  . Common bile duct  stone   . Encounter for antineoplastic immunotherapy 06/28/2015  . Atrial fibrillation ([place]) 06/11/2015  . Gynecomastia, male 06/10/2015  . Dizziness 05/11/2015  . Malignant neoplasm of lower lobe of right lung ([place]) 09/17/2014  . Neutropenic fever ([place]) 09/02/2014  . Neoplastic malignant related fatigue 08/13/2014  . Physical deconditioning 08/13/2014  . Hypoalbuminemia 08/13/2014  . Diarrhea 08/13/2014  . Antineoplastic chemotherapy induced anemia(285.3) 08/13/2014  . Atrial fibrillation with RVR ([place]) 07/28/2014  . HCAP (healthcare-associated pneumonia) 07/25/2014  . Alcoholism in remission ([place]) 07/25/2014  . Atrial flutter by electrocardiogram ([place]) 07/25/2014  . Swelling of limb 06/10/2014  . Lung cancer ([place]) 10/15/2013  . Hypotension 09/10/2013  . Adrenal mass ([place]) 09/09/2013  . 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 Pulmonary nodules 08/19/2013  . Anemia of chronic disease 07/03/2013  . BPH (benign prostatic hyperplasia) 07/03/2013  . GERD (gastroesophageal reflux disease) 07/03/2013  . Fall 07/03/2013  . Alcohol intoxication ([place]) 07/03/2013  . 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 Alcohol abuse 10/15/2011  . Hyponatremia 10/15/2011    Past Surgical History:  Procedure Laterality Date  . ANKLE ARTHRODESIS  1995   rt fx-hardware in  . CARDIOVERSION N/A 09/29/2014   Procedure: CARDIOVERSION;  Surgeon: Josue Hector, MD;  Location: [facility] ENDOSCOPY;  Service: Cardiovascular;  Laterality: N/A;  . CHOLECYSTECTOMY  09/08/2015    laproscopic   . CHOLECYSTECTOMY N/A 09/08/2015   Procedure: LAPAROSCOPIC CHOLECYSTECTOMY WITH ATTEMPTED INTRAOPERATIVE CHOLANGIOGRAM ;  Surgeon: Fanny Skates, MD;  Location:  [facility] OR;  Service: General;  Laterality: N/A;  . COLONOSCOPY    . EMBOLECTOMY Left 01/06/2020   Procedure: Embolectomy left axillary, brachial, radial, and ulnar artery;  Surgeon: Elam Dutch, MD;  Location[facility] OR;  Service: Vascular;  Laterality: Left;  .  ERCP N/A 09/02/2015   Procedure: ENDOSCOPIC RETROGRADE CHOLANGIOPANCREATOGRAPHY (ERCP);  Surgeon: Arta Silence, MD;  Location: [facility] ENDOSCOPY;  Service: Endoscopy;  Laterality: N/A;  . ERCP N/A 09/07/2015   Procedure: ENDOSCOPIC RETROGRADE CHOLANGIOPANCREATOGRAPHY (ERCP);  Surgeon: Clarene Essex, MD;  Location: [facility] ENDOSCOPY;  Service: Endoscopy;  Laterality: N/A;  . EXCISION / CURETTAGE BONE CYST FINGER  2005   rt thumb  . HEMORRHOID SURGERY    . LAPAROTOMY N/A 01/05/2020   Procedure: EXPLORATORY LAPAROTOMY sigmoid colon resection creation end colostomy for perforated diverticulitis and purulent peritonitis;  Surgeon: Kieth Brightly Arta Bruce, MD;  Location: [facility] ORS;  Service: General;  Laterality: N/A;  . Percutaneous Cholecytostomy tube     IVR insertion 06-13-15(Dr. Vernard Gambles)- 08-28-15 remains in place to drainage bag  . TONSILLECTOMY    . TOTAL SHOULDER ARTHROPLASTY  02/03/2012   Procedure: TOTAL SHOULDER ARTHROPLASTY;  Surgeon: [name], MD;  Location: [facility];  Service: Orthopedics;  Laterality: Left;       Family History  Problem Relation Age of Onset  . Lung cancer Mother   . Hypertension Father   . Heart attack Father   . Diabetes Paternal Grandmother   . Brain cancer Brother   . Heart attack Brother   . Neuropathy Neg Hx     Social History   Tobacco Use  . Smoking status: Former Smoker    Packs/day: 2.00    Years: 30.00    Pack years: 60.00    Types: Cigarettes    Quit date: 12/05/1990    Years since quitting: 29.2  . Smokeless tobacco: Never Used  Substance Use Topics  . Alcohol use: Yes    Alcohol/week: 0.0 standard drinks    Comment: past hx.ETOH abuse,08-31-15 "occ. wine ,drinks non alcoholic beer occ."  . Drug use: No    Home Medications Prior to Admission medications   Medication Sig Start Date End Date Taking? Authorizing Provider  ALPRAZolam Duanne Moron) 0.5 MG tablet Take 1 tablet (0.5 mg total) by mouth at bedtime as needed. 01/10/20   Ghimire,  Henreitta Leber, MD  apixaban (ELIQUIS) 5 MG TABS tablet Take 5 mg by mouth 2 (two) times daily.    [provider]  Ascorbic Acid (VITAMIN C PO) Take 1,000 mg by mouth daily.     [provider]  B Complex-C (SUPER B COMPLEX PO) Take 1 each by mouth daily.     [provider]  calcium carbonate (OS-CAL) 600 MG TABS Take 600 mg by mouth daily with breakfast.    [provider]  ciprofloxacin (CIPRO) 250 MG tablet Take 250 mg by mouth 2 (two) times daily. 01/27/20   [provider]  demeclocycline (DECLOMYCIN) 150 MG tablet Take 2 tablets twice a day Patient taking differently: Take 300 mg by mouth 2 (two) times daily.  07/09/19   Heilingoetter, Cassandra L, PA-C  diltiazem (CARDIZEM CD) 180 MG 24 hr capsule Take 1 capsule (180 mg total) by mouth daily at 12 noon. 01/09/15   Sherran Needs, NP  finasteride (PROSCAR) 5 MG tablet Take 1 tablet (5 mg total) by mouth daily. 01/10/20   Ghimire, Henreitta Leber, MD  fluticasone (FLONASE) 50 MCG/ACT nasal spray Place 2 sprays  into both nostrils at bedtime.  10/17/19   [provider]  HYDROcodone-acetaminophen (NORCO/VICODIN) 5-325 MG tablet Take 1 tablet by mouth 3 (three) times daily as needed for moderate pain. 01/28/20   [name], MD  KLOR-CON M20 20 MEQ tablet Take 20 mEq by mouth daily at 12 noon.  10/18/14   [provider]  Lactobacillus (ACIDOPHILUS) CAPS capsule Take 1 capsule by mouth daily.    [provider]  levofloxacin (LEVAQUIN) 500 MG tablet Take 1 tablet (500 mg total) by mouth daily. 01/30/20   Pattricia Boss, MD  levothyroxine (SYNTHROID, LEVOTHROID) 50 MCG tablet TAKE 1 TABLET (50 MCG TOTAL) BY MOUTH DAILY BEFORE BREAKFAST. 03/24/18   [name], MD  MAGNESIUM PO Take 400 mg by mouth daily.     [provider]  mirtazapine (REMERON) 30 MG tablet TAKE 1 TABLET(30 MG) BY MOUTH AT BEDTIME Patient taking differently: Take 30 mg by mouth at bedtime.  11/28/19    [name], MD  Multiple Vitamin (MULTIVITAMIN WITH MINERALS) TABS Take 1 tablet by mouth daily.    [provider]  Nivolumab (OPDIVO IV) Inject as directed every 30 (thirty) days. Receives 480mg  every 28 days.    [provider]  Nutritional Supplements (ENSURE COMPLETE PO) Take 1 Can by mouth 2 (two) times daily.    [provider]  Omega-3 Fatty Acids (FISH OIL PO) Take 1,000 mg by mouth daily.     [provider]  tamsulosin (FLOMAX) 0.4 MG CAPS capsule Take 1 capsule (0.4 mg total) by mouth 2 (two) times daily. 01/10/20   Ghimire, Henreitta Leber, MD  thiamine (VITAMIN B-1) 100 MG tablet Take 1 tablet (100 mg total) by mouth daily. 09/11/13   Erick Colace, NP  vitamin E 400 UNIT capsule Take 400 Units by mouth daily.    [provider]    Allergies    Patient has no known allergies.  Review of Systems   Review of Systems  Respiratory: Positive for cough.   Cardiovascular: Negative.   Gastrointestinal: Negative.   Genitourinary:       Foley catheter in place  Musculoskeletal: Negative.   All other systems reviewed and are negative.   Physical Exam Updated Vital Signs BP (!) 171/69 (BP Location: Right Arm)   Pulse 78   Temp 97.8 F (36.6 C) (Oral)   Resp 18   SpO2 96%   Physical Exam Vitals and nursing note reviewed.  Constitutional:      General: He is not in acute distress.    Appearance: He is ill-appearing.  HENT:     Head: Normocephalic.     Right Ear: External ear normal.     Left Ear: External ear normal.  Eyes:     Pupils: Pupils are equal, round, and reactive to light.  Cardiovascular:     Rate and Rhythm: Normal rate.  Pulmonary:     Effort: Pulmonary effort is normal.     Breath sounds: Wheezing present.  Abdominal:     General: Abdomen is flat.     Palpations: Abdomen is soft.     Comments: Colostomy bag in place  Genitourinary:    Penis: Normal.      Comments: Foley catheter in  place Musculoskeletal:     Cervical back: Normal range of motion.     Comments: Tenderness left hip with shortening and external rotation  Skin:    General: Skin is warm and dry.     Capillary Refill: Capillary  refill takes less than 2 seconds.  Neurological:     General: No focal deficit present.     Mental Status: He is alert.  Psychiatric:        Mood and Affect: Mood normal.     ED Results / Procedures / Treatments   Labs (all labs ordered are listed, but only abnormal results are displayed) Labs Reviewed  CBC WITH DIFFERENTIAL/PLATELET  PROTIME-INR  COMPREHENSIVE METABOLIC PANEL  TYPE AND SCREEN    EKG EKG Interpretation  Date/Time:  Monday February 17 2020 14:16:39 EDT Ventricular Rate:  78 PR Interval:    QRS Duration: 81 QT Interval:  367 QTC Calculation: 418 R Axis:   93 Text Interpretation: Normal sinus rhythm No significant change since last tracing Confirmed by Pattricia Boss [phone]) on 02/17/2020 2:31:54 PM   Radiology DG Chest 1 View  Result Date: 02/17/2020 CLINICAL DATA:  Cough for the past week. EXAM: CHEST  1 VIEW COMPARISON:  CT chest dated January 23, 2020. Chest x-Mykalah Saari dated January 05, 2020. FINDINGS: The heart size and mediastinal contours are within normal limits. Atherosclerotic calcification of the aortic arch. Normal pulmonary vascularity. The lungs are hyperinflated with emphysematous changes. No focal consolidation, pleural effusion, or pneumothorax. No acute osseous abnormality. Old bilateral rib fractures and right proximal humerus fracture again noted. Prior left total shoulder arthroplasty. IMPRESSION: 1. No active disease. 2. COPD. Electronically Signed   By: Titus Dubin M.D.   On: 02/17/2020 13:39   DG Hip Unilat With Pelvis 2-3 Views Left  Result Date: 02/17/2020 CLINICAL DATA:  Fall, hip pain EXAM: DG HIP (WITH OR WITHOUT PELVIS) 2-3V LEFT COMPARISON:  None. FINDINGS: Fracture of the LEFT femoral neck with varus angulation. No  dislocation. Sclerotic lesion in the LEFT inferior pubic ramus measuring 2 cm appears benign on comparison CT 01/05/2020 IMPRESSION: LEFT femoral neck fracture. Electronically Signed   By: Suzy Bouchard M.D.   On: 02/17/2020 13:37    Procedures Procedures (including critical care time)  Medications Ordered in ED Medications  fentaNYL (SUBLIMAZE) injection 25 mcg (has no administration in time range)    ED Course  I have reviewed the triage vital signs and the nursing notes.  Pertinent labs & imaging results that were available during my care of the patient were reviewed by me and considered in my medical decision making (see chart for details).    MDM Rules/Calculators/A&P                      1- fall 2- left hip fx secondary to 1- discussed with Dr. Mardelle Matte- will see for repair 3- chronic anticoagulation on eliquis 4- Covid positive 1/27- symptoms improved- no indication to retest for 3 months  Discussed with Dr. Dyanne Iha and will admit  Final Clinical Impression(s) / ED Diagnoses Final diagnoses:  Closed fracture of left hip, initial encounter [facility])  Fall, initial encounter    Rx / DC Orders ED Discharge Orders    None       Pattricia Boss, MD 02/17/20 [phone]

## 2020-02-17 NOTE — Progress Notes (Addendum)
Dr. Mardelle Matte will go ahead and perform the left hemiarthroplasty tomorrow early afternoon. Continue to hold Eliquis, no need for heparin bridge. NPO after midnight.    Pre-op COVID test not performed per guidelines. Patient had previous positive COVID-19 test on 01/01/20. Patient is asymptomatic and it has been greater than 20 days since positive test. Per guidelines, no isolation needed, standard precautions.   Spoke with patient in room and he agrees that it would be best to go ahead and perform surgery tomorrow. He is very anxious regarding physical therapy after surgery due to history of neuropathy and dizziness when walking. Patient requested I speak with daughter to inform her of surgery tomorrow.  Spoke with daughter [name] regarding impending hemiarthroplasty tomorrow. [name] states that they have had an extremely difficult time with her father's care of the last few months. After admission due to diverticulitis, patient was discharged from hospital to [facility]. Daughter states father did not have a good experience there and had very little help with ostomy care. She ended up removing him from [facility] and taking care of him at home. She states she, brother, and wife are not equipped to take care of patient full time at home but they are extremely hesitant to send him back to SNF due to last experience. This may be a tricky situation to navigate and the SNF chosen upon discharge will need to be equipped for PT as well as ostomy care. Daughter has requested that SNF discharge not be mentioned in passing to patient as he is very anxious due to last experience and it may be best to approach this with a sit down meeting with the patient and daughter on the phone.

## 2020-02-17 NOTE — ED Notes (Addendum)
Urinary catheter leg bag changed to foley bag. Colostomy emptied of medium brown formed stool. No leaking around site noted. Med with IV Fentanyl for pain. Cont to await scheduled meds from pharmacy.

## 2020-02-17 NOTE — Progress Notes (Signed)
ANTICOAGULATION CONSULT NOTE  Pharmacy Consult for Heparin bridge for surgery Indication: PTA on Eliquis for Afib, arterial embolectomy 01/06/2020  No Known Allergies  Patient Measurements: Height: 5\' 11"  (180.3 cm) Weight: 130 lb (59 kg) IBW/kg (Calculated) : 75.3 Heparin Dosing Weight: 59 kg  Vital Signs: Temp: 97.8 F (36.6 C) (03/15 1218) Temp Source: Oral (03/15 1218) BP: 146/85 (03/15 1403) Pulse Rate: 82 (03/15 1403)  Labs: Recent Labs    02/17/20 1407  HGB 14.2  HCT 42.9  PLT 428*  LABPROT 14.1  INR 1.1  CREATININE 0.68   Estimated Creatinine Clearance: 61.5 mL/min (by C-G formula based on SCr of 0.68 mg/dL).  Medical History: Past Medical History:  Diagnosis Date  . 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 Adrenal mass ([place])   . Alcoholism in remission ([place])   . Anemia of chronic disease   . 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 Anxiety   . BPH (benign prostatic hyperplasia)   . Cancer ([place])    small cell/lung  . Complication of anesthesia 09/08/2015    JITTERY AFTER GALLBLADDER SURGERY  . GERD (gastroesophageal reflux disease)   . History of radiation therapy 11/14/16, 11/18/16, 11/22/16   Left upper lung: 54 Gy in 3 fractions  . Hypertension   . Neoplastic malignant related fatigue    IV tx. every 2 weeks last9-14-16 (Dr. Earlie Server), radiation last tx. 6 months  . 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 Pericardial effusion   . Persistent atrial fibrillation ([place])   . Pulmonary nodules   . Radiation 12/02/14-12/16/14   Left adrenal gland 30 Gy in 10 fractions   Medications:  Scheduled:  [name] Acidophilus  1 capsule Oral Daily  . vitamin C  1,000 mg Oral Daily  . [START ON 02/18/2020] calcium carbonate  600 mg Oral Q breakfast  . demeclocycline  300 mg Oral BID  . [START ON 02/18/2020] diltiazem  180 mg Oral Q1200  . feeding supplement (ENSURE COMPLETE)  237 mL Oral BID  . finasteride  5 mg Oral Daily  . fluticasone  2 spray Each Nare QHS   . [START ON 02/18/2020] levothyroxine  50 mcg Oral QAC breakfast  . Magnesium  400 mg Oral Daily  . mirtazapine  30 mg Oral QHS  . multivitamin with minerals  1 tablet Oral Daily  . omega-3 acid ethyl esters  1 g Oral Daily  . [START ON 02/18/2020] potassium chloride SA  20 mEq Oral Q1200  . tamsulosin  0.4 mg Oral BID  . thiamine  100 mg Oral Daily  . vitamin E  400 Units Oral Daily   Infusions:    Assessment: 63 yoM with L hip fx, fall at home. Last admit 1/28 - 2/5 perf diverticulitis- surgery & clot Hx of NSCLC, stage IV, Afib & arterial clot on Eliquis 5mg  bid, SIADH, hypoThyroid,  01/05/20 Expl Lap: colon resection, colostomy, 01/06/20 LUE arterial embolectomy  Last dose Apixaban 3/15 am ~ 0930 Ordered baseline aPTT & Hep level - aPTT should be wnl, Heparin level should be elevated on Eliquis, will dose based on aPTT until Eliquis effect dissipates and Hep level correlates with aPTT   Goal of Therapy:  Heparin level 0.3-0.7 units/ml aPTT 66-102 seconds Monitor platelets by anticoagulation protocol: Yes   Plan:  Begin Heparin at 1000 units/hr at 2200 (when next dose Apixaban would be due) Check 1st aPTT & Hep level with am labs Monitor for bleed  Minda Ditto PharmD 02/17/2020,4:03 PM

## 2020-02-17 NOTE — H&P (Addendum)
History and Physical    DOA: 02/17/2020  PCP: [name], MD  Patient coming from: home  Chief Complaint: fall   HPI: Anthony Curry. is a 79 y.o. 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 fib, SIADH, hypothyroidism, depression, anxiety who was hospitalized 1/28-01/10/20 for perforated diverticulitis and underwent sigmoid colectomy with colostomy creation, this hospital course complicated by Covid 19 pneumonia, urinary retention requiring indwelling foley, ischemic left hand requiring left brachial embolectomy on 2/1 by vascular surgery ( Dr Oneida Alar), on Eliquis since then, presented today to ED with mechanical fall /left hip pain.Patient had complicated hospital course and was discharged to SNF at last discharge but states was not getting enough help there, felt miserable and couldn't stay there long. Left home the following day. He lost 15-20 pounds weight since last admission. He fell while getting off the sofa, denies any chest pain/shortness of breath/palpitations/dizziness before or after the fall. He has had some chest congestion over the last 10 days and called oncology office for help but states didn't hear back from them. Currently reporting 10/10 left hip pain with muscle cramping.  ED Course: Afebrile, pulse 78, respiratory rate 18, blood pressure 146/84--171/69, O2 sat 99% on room air.  WBC 8.3, hemoglobin 14.2 hematocrit 42.9, platelet 428, sodium 132, potassium 4.3, chloride 96, BUN 18, creatinine 0.68, calcium 9.4, INR 1.1, glucose 96. Hip x-ray showed left femoral neck angulated fracture, CXR with COPD changes, no acute infiltrates.  Patient took Eliquis this morning.  Orthopedics (Dr. Mardelle Matte) consulted, who recommended to hold Eliquis and plan for interval surgery in 48 hours.    Review of Systems: As per HPI otherwise 10 point review of systems negative.    Past Medical History:  Diagnosis Date  .  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 Adrenal mass ([place])   . Alcoholism in remission ([place])   . Anemia of chronic disease   . 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 Anxiety   . BPH (benign prostatic hyperplasia)   . Cancer ([place])    small cell/lung  . Complication of anesthesia 09/08/2015    JITTERY AFTER GALLBLADDER SURGERY  . GERD (gastroesophageal reflux disease)   . History of radiation therapy 11/14/16, 11/18/16, 11/22/16   Left upper lung: 54 Gy in 3 fractions  . Hypertension   . Neoplastic malignant related fatigue    IV tx. every 2 weeks last9-14-16 (Dr. Earlie Server), radiation last tx. 6 months  . 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 Pericardial effusion   . Persistent atrial fibrillation ([place])   . Pulmonary nodules   . Radiation 12/02/14-12/16/14   Left adrenal gland 30 Gy in 10 fractions    Past Surgical History:  Procedure Laterality Date  . ANKLE ARTHRODESIS  1995   rt fx-hardware in  . CARDIOVERSION N/A 09/29/2014   Procedure: CARDIOVERSION;  Surgeon: Josue Hector, MD;  Location: [facility] ENDOSCOPY;  Service: Cardiovascular;  Laterality: N/A;  . CHOLECYSTECTOMY  09/08/2015    laproscopic   . CHOLECYSTECTOMY N/A 09/08/2015   Procedure: LAPAROSCOPIC CHOLECYSTECTOMY WITH ATTEMPTED INTRAOPERATIVE CHOLANGIOGRAM ;  Surgeon: Fanny Skates, MD;  Location: [facility];  Service: General;  Laterality: N/A;  . COLONOSCOPY    . EMBOLECTOMY Left 01/06/2020   Procedure: Embolectomy left axillary, brachial, radial, and ulnar artery;  Surgeon: Elam Dutch, MD;  Location[facility] OR;  Service: Vascular;  Laterality: Left;  . ERCP N/A 09/02/2015  Procedure: ENDOSCOPIC RETROGRADE CHOLANGIOPANCREATOGRAPHY (ERCP);  Surgeon: Arta Silence, MD;  Location: [facility] ENDOSCOPY;  Service: Endoscopy;  Laterality: N/A;  . ERCP N/A 09/07/2015   Procedure: ENDOSCOPIC RETROGRADE CHOLANGIOPANCREATOGRAPHY (ERCP);  Surgeon: Clarene Essex, MD;  Location: [facility] ENDOSCOPY;  Service: Endoscopy;  Laterality: N/A;  .  EXCISION / CURETTAGE BONE CYST FINGER  2005   rt thumb  . HEMORRHOID SURGERY    . LAPAROTOMY N/A 01/05/2020   Procedure: EXPLORATORY LAPAROTOMY sigmoid colon resection creation end colostomy for perforated diverticulitis and purulent peritonitis;  Surgeon: Kieth Brightly Arta Bruce, MD;  Location: [facility] ORS;  Service: General;  Laterality: N/A;  . Percutaneous Cholecytostomy tube     IVR insertion 06-13-15(Dr. Vernard Gambles)- 08-28-15 remains in place to drainage bag  . TONSILLECTOMY    . TOTAL SHOULDER ARTHROPLASTY  02/03/2012   Procedure: TOTAL SHOULDER ARTHROPLASTY;  Surgeon: [name], MD;  Location: [facility];  Service: Orthopedics;  Laterality: Left;    Social history:  reports that he quit smoking about 29 years ago. His smoking use included cigarettes. He has a 60.00 pack-year smoking history. He has never used smokeless tobacco. He reports current alcohol use. He reports that he does not use drugs.   No Known Allergies  Family History  Problem Relation Age of Onset  . Lung cancer Mother   . Hypertension Father   . Heart attack Father   . Diabetes Paternal Grandmother   . Brain cancer Brother   . Heart attack Brother   . Neuropathy Neg Hx       Prior to Admission medications   Medication Sig Start Date End Date Taking? Authorizing Provider  ALPRAZolam Duanne Moron) 0.5 MG tablet Take 1 tablet (0.5 mg total) by mouth at bedtime as needed. 01/10/20   Ghimire, Henreitta Leber, MD  apixaban (ELIQUIS) 5 MG TABS tablet Take 5 mg by mouth 2 (two) times daily.    [provider]  Ascorbic Acid (VITAMIN C PO) Take 1,000 mg by mouth daily.     [provider]  B Complex-C (SUPER B COMPLEX PO) Take 1 each by mouth daily.     [provider]  calcium carbonate (OS-CAL) 600 MG TABS Take 600 mg by mouth daily with breakfast.    [provider]  ciprofloxacin (CIPRO) 250 MG tablet Take 250 mg by mouth 2 (two) times daily. 01/27/20   [provider]    demeclocycline (DECLOMYCIN) 150 MG tablet Take 2 tablets twice a day Patient taking differently: Take 300 mg by mouth 2 (two) times daily.  07/09/19   Heilingoetter, Cassandra L, PA-C  diltiazem (CARDIZEM CD) 180 MG 24 hr capsule Take 1 capsule (180 mg total) by mouth daily at 12 noon. 01/09/15   Sherran Needs, NP  finasteride (PROSCAR) 5 MG tablet Take 1 tablet (5 mg total) by mouth daily. 01/10/20   Ghimire, Henreitta Leber, MD  fluticasone (FLONASE) 50 MCG/ACT nasal spray Place 2 sprays into both nostrils at bedtime.  10/17/19   [provider]  HYDROcodone-acetaminophen (NORCO/VICODIN) 5-325 MG tablet Take 1 tablet by mouth 3 (three) times daily as needed for moderate pain. 01/28/20   [name], MD  KLOR-CON M20 20 MEQ tablet Take 20 mEq by mouth daily at 12 noon.  10/18/14   [provider]  Lactobacillus (ACIDOPHILUS) CAPS capsule Take 1 capsule by mouth daily.    [provider]  levofloxacin (LEVAQUIN) 500 MG tablet Take 1 tablet (500 mg total) by mouth daily. 01/30/20  Pattricia Boss, MD  levothyroxine (SYNTHROID, LEVOTHROID) 50 MCG tablet TAKE 1 TABLET (50 MCG TOTAL) BY MOUTH DAILY BEFORE BREAKFAST. 03/24/18   [name], MD  MAGNESIUM PO Take 400 mg by mouth daily.     [provider]  mirtazapine (REMERON) 30 MG tablet TAKE 1 TABLET(30 MG) BY MOUTH AT BEDTIME Patient taking differently: Take 30 mg by mouth at bedtime.  11/28/19   [name], MD  Multiple Vitamin (MULTIVITAMIN WITH MINERALS) TABS Take 1 tablet by mouth daily.    [provider]  Nivolumab (OPDIVO IV) Inject as directed every 30 (thirty) days. Receives 480mg  every 28 days.    [provider]  Nutritional Supplements (ENSURE COMPLETE PO) Take 1 Can by mouth 2 (two) times daily.    [provider]  Omega-3 Fatty Acids (FISH OIL PO) Take 1,000 mg by mouth daily.     [provider]  tamsulosin (FLOMAX) 0.4 MG CAPS capsule Take 1 capsule (0.4 mg  total) by mouth 2 (two) times daily. 01/10/20   Ghimire, Henreitta Leber, MD  thiamine (VITAMIN B-1) 100 MG tablet Take 1 tablet (100 mg total) by mouth daily. 09/11/13   Erick Colace, NP  vitamin E 400 UNIT capsule Take 400 Units by mouth daily.    [provider]    Physical Exam: Vitals:   02/17/20 1231 02/17/20 1300 02/17/20 1341 02/17/20 1403  BP:  (!) 163/74  (!) 146/85  Pulse:  83  82  Resp:  16  14  Temp:      TempSrc:      SpO2: 96% 99%  94%  Weight:   59 kg   Height:   5\' 11"  (1.803 m)     Constitutional: Thin built male in moderate distress due to pain  Eyes: PERRL, lids and conjunctivae normal ENMT: Mucous membranes are moist. Posterior pharynx clear.Normal dentition.  Neck: normal, supple, no masses, no thyromegaly Respiratory: appears to have wet cough but clear to auscultation bilaterally, no wheezing, no crackles. Normal respiratory effort. No accessory muscle use.  Cardiovascular: Regular rate and rhythm, no murmurs / rubs / gallops. No extremity edema. 2+ pedal pulses. No carotid bruits.  Abdomen: s/p colostomy, no tenderness, no masses palpated. Bowel sounds positive.  Musculoskeletal: no clubbing / cyanosis. Varus deformity of LLE due to acute L hip # Neurologic: CN 2-12 grossly intact. Sensation intact, DTR normal. Strength 5/5 in all 4.  Psychiatric: Normal judgment and insight. Alert and oriented x 3. Irritable mood. SKIN/catheters: no rashes, lesions, ulcers. No induration. S/p colostomy/ s/p Foley  Labs on Admission: I have personally reviewed following labs and imaging studies  CBC: Recent Labs  Lab 02/17/20 1407  WBC 8.3  NEUTROABS 6.1  HGB 14.2  HCT 42.9  MCV 89.7  PLT 458*   Basic Metabolic Panel: Recent Labs  Lab 02/17/20 1407  NA 132*  K 4.3  CL 96*  CO2 27  GLUCOSE 96  BUN 18  CREATININE 0.68  CALCIUM 9.4   GFR: Estimated Creatinine Clearance: 61.5 mL/min (by C-G formula based on SCr of 0.68 mg/dL). Recent Labs  Lab  02/17/20 1407  WBC 8.3   Liver Function Tests: Recent Labs  Lab 02/17/20 1407  AST 27  ALT 24  ALKPHOS 123  BILITOT 0.6  PROT 6.5  ALBUMIN 3.7   No results for input(s): LIPASE, AMYLASE in the last 168 hours. No results for input(s): AMMONIA in the last 168 hours. Coagulation Profile: Recent Labs  Lab 02/17/20  1407  INR 1.1   Cardiac Enzymes: No results for input(s): CKTOTAL, CKMB, CKMBINDEX, TROPONINI in the last 168 hours. BNP (last 3 results) No results for input(s): PROBNP in the last 8760 hours. HbA1C: No results for input(s): HGBA1C in the last 72 hours. CBG: No results for input(s): GLUCAP in the last 168 hours. Lipid Profile: No results for input(s): CHOL, HDL, LDLCALC, TRIG, CHOLHDL, LDLDIRECT in the last 72 hours. Thyroid Function Tests: No results for input(s): TSH, T4TOTAL, FREET4, T3FREE, THYROIDAB in the last 72 hours. Anemia Panel: No results for input(s): VITAMINB12, FOLATE, FERRITIN, TIBC, IRON, RETICCTPCT in the last 72 hours. Urine analysis:    Component Value Date/Time   COLORURINE YELLOW 01/30/2020 0920   APPEARANCEUR CLOUDY (A) 01/30/2020 0920   LABSPEC 1.006 01/30/2020 0920   PHURINE 8.0 01/30/2020 0920   GLUCOSEU NEGATIVE 01/30/2020 0920   HGBUR NEGATIVE 01/30/2020 0920   BILIRUBINUR NEGATIVE 01/30/2020 0920   KETONESUR NEGATIVE 01/30/2020 0920   PROTEINUR NEGATIVE 01/30/2020 0920   UROBILINOGEN 0.2 09/10/2015 0940   NITRITE NEGATIVE 01/30/2020 0920   LEUKOCYTESUR SMALL (A) 01/30/2020 0920    Radiological Exams on Admission: Personally reviewed  DG Chest 1 View  Result Date: 02/17/2020 CLINICAL DATA:  Cough for the past week. EXAM: CHEST  1 VIEW COMPARISON:  CT chest dated January 23, 2020. Chest x-ray dated January 05, 2020. FINDINGS: The heart size and mediastinal contours are within normal limits. Atherosclerotic calcification of the aortic arch. Normal pulmonary vascularity. The lungs are hyperinflated with emphysematous changes.  No focal consolidation, pleural effusion, or pneumothorax. No acute osseous abnormality. Old bilateral rib fractures and right proximal humerus fracture again noted. Prior left total shoulder arthroplasty. IMPRESSION: 1. No active disease. 2. COPD. Electronically Signed   By: Titus Dubin M.D.   On: 02/17/2020 13:39   DG Hip Unilat With Pelvis 2-3 Views Left  Result Date: 02/17/2020 CLINICAL DATA:  Fall, hip pain EXAM: DG HIP (WITH OR WITHOUT PELVIS) 2-3V LEFT COMPARISON:  None. FINDINGS: Fracture of the LEFT femoral neck with varus angulation. No dislocation. Sclerotic lesion in the LEFT inferior pubic ramus measuring 2 cm appears benign on comparison CT 01/05/2020 IMPRESSION: LEFT femoral neck fracture. Electronically Signed   By: Suzy Bouchard M.D.   On: 02/17/2020 13:37    EKG: Independently reviewed. NSR Qtc 418 msec     Assessment and Plan:   Principal Problem:   Left displaced femoral neck fracture (HCC) Active Problems:   Fall   Physical deconditioning   Pericardial effusion   Thromboembolism (HCC)   Hyponatremia   BPH (benign prostatic hyperplasia)   GERD (gastroesophageal reflux disease)   Hypoalbuminemia   Malignant neoplasm of lower lobe of right lung (HCC)   Atrial fibrillation (HCC)   Hypertension   SIADH (syndrome of inappropriate ADH production) ([place])   Diverticulitis    1. Left Femoral neck fracture: Seen by ortho and plan for surgery/ left hip hemiarthroplasty with Dr. Mardelle Matte on 3/18. Patients primary c/o is severe pain with muscle cramp currently. Add IV Robaxin to IV fentanyl /oxycodone for pain control. Stool softeners while on opiates. Watch for sedation  2.  Recent complicated diverticulitis: s/p surgery and is s/p colostomy. No acute c/o. Resume diet, will need NPO after MN on 3/17  3. Right sided-stage IV non-small cell lung cancer: Patient follows Dr.  Earlie Server and receives immunotherapy as outpatient.  Appears to have wet cough, CXR no pneumonia.  Add mucolytics and bronchodilators. Denies hemoptysis.   4. Pericardial  effusion:Evaluated by cardiology (Dr. Farris Has) on 1/29 regarding CT/echo suggestive of moderate to large pericardial effusion.  Cardiology felt on independent review that patient had moderate pericardial effusion likely due to lung CA and recommended continuation of anticoagulation.  Patient had repeat echocardiogram on 2/3-with no tamponade physiology.   5.  Paroxysmal atrial fibrillation: continue Cardizem-was on IV heparin in last hospitalization and underwent embolectomy by vascular surgery, was transitioned to Eliquis at discharge with recommendations for indefinite therapy.  6. Urinary retention:Had Foley catheter placed in last admission for retention of 700 mL urine, he failed voiding trial on 2/4-Continue Flomax, finasteride.  He was advised to follow-up urology regarding hematuria/urinary retention.  7. SIADH/chronic hyponatremia:It appears that patient has had historically fluctuating sodium levels 128-135.Most likely associatedwith SIADH related to lung cancer. He takes demeclocycline which will be continued. Sodium level at 132 today.Check BMP tomorrow.  8. Hypothyroidism: Continue Synthroid  9.  Emphysema/recent COVID-19 pneumonia: He was treated with steroids, remdesivir in last admission.  Now recovered and repeat chest x-ray unremarkable with no residual infiltrates.  No need of retest as within 32-month window from last COVID-19 infection.  Resume inhaler therapy.  10. GERD: PPI  11. Deconditioning/debility with protein cal malnutrition:Secondary to recent acute illness/prolonged hospitalization,complications and underlying cancer-he was evaluated by PT services at last discharge and sent to SNF.  He presents today from home with fall/hip fracture.PT to f/u after surgery. Resume protein supplements advised in last admission  DVT prophylaxis: Took Eliquis this morning, possibly IV heparin in  a.m.  COVID screen: Not applicable as had Covid infection in January 2021  Code Status: Full code as confirmed by patient. He states he wouldn't want to be kept alive for long on artificial means but would want everything done in case of medical emergency  .Health care proxy would be daughter [name]  Patient/Family Communication: Discussed with patient and all questions answered to satisfaction.  Consults called: Cardiology, orthopedics Admission status :I certify that at the point of admission it is my clinical judgment that the patient will require inpatient hospital care spanning beyond 2 midnights from the point of admission due to high intensity of service and high frequency of surveillance required.Inpatient status is judged to be reasonable and necessary in order to provide the required intensity of service to ensure the patient's safety. The patient's presenting symptoms, physical exam findings, and initial radiographic and laboratory data in the context of their chronic comorbidities is felt to place them at high risk for further clinical deterioration. The following factors support the patient status of inpatient : Acute hip fracture requiring bridging anticoagulation and interval surgery on 3/18.  Will most likely need rehab/skilled nursing facility upon discharge.      Guilford Shi MD [facility] Pager in [place]  If 7PM-7AM, please contact night-coverage [email or web address]   02/17/2020, 3:33 PM

## 2020-02-18 ENCOUNTER — Inpatient Hospital Stay (HOSPITAL_COMMUNITY): Payer: Medicare Other

## 2020-02-18 ENCOUNTER — Encounter (HOSPITAL_COMMUNITY)
Admission: EM | Disposition: A | Discharge status: Skilled Nursing Facility | Payer: Self-pay | Source: Home / Self Care | Attending: Internal Medicine

## 2020-02-18 ENCOUNTER — Encounter (HOSPITAL_COMMUNITY): Payer: Self-pay | Admitting: Internal Medicine

## 2020-02-18 ENCOUNTER — Inpatient Hospital Stay (HOSPITAL_COMMUNITY): Payer: Medicare Other | Admitting: Anesthesiology

## 2020-02-18 ENCOUNTER — Telehealth: Payer: Self-pay | Admitting: *Deleted

## 2020-02-18 DIAGNOSIS — I480 Paroxysmal atrial fibrillation: Secondary | ICD-10-CM

## 2020-02-18 DIAGNOSIS — Z01810 Encounter for preprocedural cardiovascular examination: Secondary | ICD-10-CM

## 2020-02-18 DIAGNOSIS — E8809 Other disorders of plasma-protein metabolism, not elsewhere classified: Secondary | ICD-10-CM

## 2020-02-18 HISTORY — PX: HIP ARTHROPLASTY: SHX981

## 2020-02-18 LAB — BASIC METABOLIC PANEL
Anion gap: 8 (ref 5–15)
BUN: 16 mg/dL (ref 8–23)
CO2: 25 mmol/L (ref 22–32)
Calcium: 8.7 mg/dL — ABNORMAL LOW (ref 8.9–10.3)
Chloride: 98 mmol/L (ref 98–111)
Creatinine, Ser: 0.65 mg/dL (ref 0.61–1.24)
GFR calc Af Amer: >60 mL/min (ref >60)
GFR calc non Af Amer: >60 mL/min (ref >60)
Glucose, Bld: 111 mg/dL — ABNORMAL HIGH (ref 70–99)
Potassium: 3.7 mmol/L (ref 3.5–5.1)
Sodium: 131 mmol/L — ABNORMAL LOW (ref 135–145)

## 2020-02-18 LAB — POCT I-STAT, CHEM 8
BUN: 14 mg/dL (ref 8–23)
Calcium, Ion: 1.19 mmol/L (ref 1.15–1.40)
Chloride: 97 mmol/L — ABNORMAL LOW (ref 98–111)
Creatinine, Ser: 0.7 mg/dL (ref 0.61–1.24)
Glucose, Bld: 111 mg/dL — ABNORMAL HIGH (ref 70–99)
HCT: 29 % — ABNORMAL LOW (ref 39.0–52.0)
Hemoglobin: 9.9 g/dL — ABNORMAL LOW (ref 13.0–17.0)
Potassium: 4 mmol/L (ref 3.5–5.1)
Sodium: 132 mmol/L — ABNORMAL LOW (ref 135–145)
TCO2: 24 mmol/L (ref 22–32)

## 2020-02-18 LAB — SURGICAL PCR SCREEN
MRSA, PCR: NEGATIVE
Staphylococcus aureus: POSITIVE — AB

## 2020-02-18 LAB — APTT
aPTT: >200 seconds (ref 24–36)
aPTT: 82 seconds — ABNORMAL HIGH (ref 24–36)

## 2020-02-18 LAB — CBC
HCT: 37 % — ABNORMAL LOW (ref 39.0–52.0)
Hemoglobin: 12.6 g/dL — ABNORMAL LOW (ref 13.0–17.0)
MCH: 30.1 pg (ref 26.0–34.0)
MCHC: 34.1 g/dL (ref 30.0–36.0)
MCV: 88.5 fL (ref 80.0–100.0)
Platelets: 379 10*3/uL (ref 150–400)
RBC: 4.18 MIL/uL — ABNORMAL LOW (ref 4.22–5.81)
RDW: 14.8 % (ref 11.5–15.5)
WBC: 11.3 10*3/uL — ABNORMAL HIGH (ref 4.0–10.5)
nRBC: 0 % (ref 0.0–0.2)

## 2020-02-18 LAB — HEPARIN LEVEL (UNFRACTIONATED)
Heparin Unfractionated: 1.4 IU/mL — ABNORMAL HIGH (ref 0.30–0.70)
Heparin Unfractionated: 1.12 IU/mL — ABNORMAL HIGH (ref 0.30–0.70)

## 2020-02-18 SURGERY — HEMIARTHROPLASTY, HIP, DIRECT ANTERIOR APPROACH, FOR FRACTURE
Anesthesia: General | Site: Hip | Laterality: Left

## 2020-02-18 MED ORDER — FERROUS SULFATE 325 (65 FE) MG PO TABS
325.0000 mg | ORAL_TABLET | Freq: Three times a day (TID) | ORAL | Status: DC
  Administered 2020-02-19 – 2020-02-25 (×20): 325 mg via ORAL
  Filled 2020-02-18 (×20): qty 1

## 2020-02-18 MED ORDER — ALUM & MAG HYDROXIDE-SIMETH 200-200-20 MG/5ML PO SUSP
30.0000 mL | ORAL | Status: DC | PRN
  Administered 2020-02-19 – 2020-02-22 (×2): 30 mL via ORAL
  Filled 2020-02-18 (×2): qty 30

## 2020-02-18 MED ORDER — FENTANYL CITRATE (PF) 100 MCG/2ML IJ SOLN: 25.0000 ug | INTRAMUSCULAR | Status: DC | PRN

## 2020-02-18 MED ORDER — PROPOFOL 10 MG/ML IV BOLUS
INTRAVENOUS | Status: DC | PRN
  Administered 2020-02-18: 100 mg via INTRAVENOUS

## 2020-02-18 MED ORDER — PHENOL 1.4 % MT LIQD: 1.0000 | OROMUCOSAL | Status: DC | PRN

## 2020-02-18 MED ORDER — SENNOSIDES-DOCUSATE SODIUM 8.6-50 MG PO TABS: 1.0000 | ORAL_TABLET | Freq: Two times a day (BID) | ORAL | Status: DC

## 2020-02-18 MED ORDER — ONDANSETRON HCL 4 MG/2ML IJ SOLN
INTRAMUSCULAR | Status: DC | PRN
  Administered 2020-02-18: 4 mg via INTRAVENOUS

## 2020-02-18 MED ORDER — POLYETHYLENE GLYCOL 3350 17 G PO PACK: 17.0000 g | PACK | Freq: Every day | ORAL | Status: DC

## 2020-02-18 MED ORDER — FENTANYL CITRATE (PF) 100 MCG/2ML IJ SOLN
INTRAMUSCULAR | Status: DC | PRN
  Administered 2020-02-18 (×4): 25 ug via INTRAVENOUS
  Administered 2020-02-18: 50 ug via INTRAVENOUS

## 2020-02-18 MED ORDER — MORPHINE SULFATE (PF) 2 MG/ML IV SOLN: 2.0000 mg | INTRAVENOUS | Status: DC | PRN

## 2020-02-18 MED ORDER — CEFAZOLIN SODIUM-DEXTROSE 2-4 GM/100ML-% IV SOLN
2.0000 g | Freq: Four times a day (QID) | INTRAVENOUS | Status: AC
  Administered 2020-02-18 – 2020-02-19 (×2): 2 g via INTRAVENOUS
  Filled 2020-02-18 (×2): qty 100

## 2020-02-18 MED ORDER — LACTATED RINGERS IV SOLN: INTRAVENOUS | Status: DC | PRN

## 2020-02-18 MED ORDER — 0.9 % SODIUM CHLORIDE (POUR BTL) OPTIME
TOPICAL | Status: DC | PRN
  Administered 2020-02-18: 1000 mL

## 2020-02-18 MED ORDER — MENTHOL 3 MG MT LOZG: 1.0000 | LOZENGE | OROMUCOSAL | Status: DC | PRN

## 2020-02-18 MED ORDER — HYDROCODONE-ACETAMINOPHEN 5-325 MG PO TABS: 1.0000 | ORAL_TABLET | ORAL | Status: DC | PRN

## 2020-02-18 MED ORDER — APIXABAN 5 MG PO TABS
5.0000 mg | ORAL_TABLET | Freq: Two times a day (BID) | ORAL | Status: DC
  Administered 2020-02-19 – 2020-02-25 (×12): 5 mg via ORAL
  Filled 2020-02-18 (×12): qty 1

## 2020-02-18 MED ORDER — MAGNESIUM CITRATE PO SOLN: 1.0000 | Freq: Once | ORAL | Status: DC | PRN

## 2020-02-18 MED ORDER — SUGAMMADEX SODIUM 200 MG/2ML IV SOLN
INTRAVENOUS | Status: DC | PRN
  Administered 2020-02-18: 150 mg via INTRAVENOUS

## 2020-02-18 MED ORDER — PHENYLEPHRINE HCL (PRESSORS) 10 MG/ML IV SOLN
INTRAVENOUS | Status: AC
  Filled 2020-02-18: qty 1

## 2020-02-18 MED ORDER — ONDANSETRON HCL 4 MG/2ML IJ SOLN
4.0000 mg | Freq: Four times a day (QID) | INTRAMUSCULAR | Status: DC | PRN
  Administered 2020-02-20: 4 mg via INTRAVENOUS
  Filled 2020-02-18: qty 2

## 2020-02-18 MED ORDER — ACETAMINOPHEN 500 MG PO TABS
500.0000 mg | ORAL_TABLET | Freq: Four times a day (QID) | ORAL | Status: AC
  Administered 2020-02-18 – 2020-02-19 (×3): 500 mg via ORAL
  Filled 2020-02-18 (×3): qty 1

## 2020-02-18 MED ORDER — PHENYLEPHRINE 40 MCG/ML (10ML) SYRINGE FOR IV PUSH (FOR BLOOD PRESSURE SUPPORT)
PREFILLED_SYRINGE | INTRAVENOUS | Status: DC | PRN
  Administered 2020-02-18 (×2): 200 ug via INTRAVENOUS

## 2020-02-18 MED ORDER — SENNA 8.6 MG PO TABS
1.0000 | ORAL_TABLET | Freq: Two times a day (BID) | ORAL | Status: DC
  Administered 2020-02-18 – 2020-02-22 (×9): 8.6 mg via ORAL
  Filled 2020-02-18 (×10): qty 1

## 2020-02-18 MED ORDER — LIDOCAINE 2% (20 MG/ML) 5 ML SYRINGE
INTRAMUSCULAR | Status: DC | PRN
  Administered 2020-02-18: 50 mg via INTRAVENOUS

## 2020-02-18 MED ORDER — FENTANYL CITRATE (PF) 100 MCG/2ML IJ SOLN
INTRAMUSCULAR | Status: AC
  Filled 2020-02-18: qty 2

## 2020-02-18 MED ORDER — MIDAZOLAM HCL 5 MG/5ML IJ SOLN
INTRAMUSCULAR | Status: DC | PRN
  Administered 2020-02-18: 1 mg via INTRAVENOUS

## 2020-02-18 MED ORDER — ACETAMINOPHEN 325 MG PO TABS: 325.0000 mg | ORAL_TABLET | Freq: Four times a day (QID) | ORAL | Status: DC | PRN

## 2020-02-18 MED ORDER — PROPOFOL 10 MG/ML IV BOLUS
INTRAVENOUS | Status: AC
  Filled 2020-02-18: qty 20

## 2020-02-18 MED ORDER — LACTATED RINGERS IV SOLN: INTRAVENOUS | Status: DC

## 2020-02-18 MED ORDER — PROPOFOL 500 MG/50ML IV EMUL
INTRAVENOUS | Status: AC
  Filled 2020-02-18: qty 50

## 2020-02-18 MED ORDER — CHLORHEXIDINE GLUCONATE CLOTH 2 % EX PADS: 6.0000 | MEDICATED_PAD | Freq: Every day | CUTANEOUS | Status: DC

## 2020-02-18 MED ORDER — ALBUTEROL SULFATE (2.5 MG/3ML) 0.083% IN NEBU
INHALATION_SOLUTION | RESPIRATORY_TRACT | Status: AC
  Administered 2020-02-18: 2.5 mg via RESPIRATORY_TRACT
  Filled 2020-02-18: qty 3

## 2020-02-18 MED ORDER — SUCCINYLCHOLINE CHLORIDE 200 MG/10ML IV SOSY
PREFILLED_SYRINGE | INTRAVENOUS | Status: DC | PRN
  Administered 2020-02-18: 80 mg via INTRAVENOUS

## 2020-02-18 MED ORDER — HYDROCODONE-ACETAMINOPHEN 5-325 MG PO TABS
1.0000 | ORAL_TABLET | ORAL | Status: DC | PRN
  Administered 2020-02-19 – 2020-02-23 (×2): 2 via ORAL
  Administered 2020-02-23: 1 via ORAL
  Filled 2020-02-18 (×2): qty 2
  Filled 2020-02-18: qty 1

## 2020-02-18 MED ORDER — ROCURONIUM BROMIDE 10 MG/ML (PF) SYRINGE
PREFILLED_SYRINGE | INTRAVENOUS | Status: DC | PRN
  Administered 2020-02-18: 40 mg via INTRAVENOUS

## 2020-02-18 MED ORDER — STERILE WATER FOR IRRIGATION IR SOLN
Status: DC | PRN
  Administered 2020-02-18: 1000 mL

## 2020-02-18 MED ORDER — SODIUM CHLORIDE 0.9 % IV SOLN: INTRAVENOUS | Status: DC | PRN

## 2020-02-18 MED ORDER — ALBUMIN HUMAN 5 % IV SOLN: INTRAVENOUS | Status: DC | PRN

## 2020-02-18 MED ORDER — PHENYLEPHRINE HCL-NACL 10-0.9 MG/250ML-% IV SOLN
INTRAVENOUS | Status: DC | PRN
  Administered 2020-02-18: 100 ug/min via INTRAVENOUS

## 2020-02-18 MED ORDER — ALBUTEROL SULFATE (2.5 MG/3ML) 0.083% IN NEBU: 2.5000 mg | INHALATION_SOLUTION | Freq: Once | RESPIRATORY_TRACT | Status: AC

## 2020-02-18 MED ORDER — VANCOMYCIN HCL 1000 MG IV SOLR
INTRAVENOUS | Status: AC
  Filled 2020-02-18: qty 1000

## 2020-02-18 MED ORDER — MIDAZOLAM HCL 2 MG/2ML IJ SOLN
INTRAMUSCULAR | Status: AC
  Filled 2020-02-18: qty 2

## 2020-02-18 MED ORDER — ONDANSETRON HCL 4 MG PO TABS: 4.0000 mg | ORAL_TABLET | Freq: Four times a day (QID) | ORAL | Status: DC | PRN

## 2020-02-18 MED ORDER — BISACODYL 10 MG RE SUPP: 10.0000 mg | Freq: Every day | RECTAL | Status: DC | PRN

## 2020-02-18 MED ORDER — PHENYLEPHRINE HCL (PRESSORS) 10 MG/ML IV SOLN
INTRAVENOUS | Status: DC | PRN
  Administered 2020-02-18: 160 ug via INTRAVENOUS
  Administered 2020-02-18 (×2): 120 ug via INTRAVENOUS

## 2020-02-18 MED ORDER — DOCUSATE SODIUM 100 MG PO CAPS
100.0000 mg | ORAL_CAPSULE | Freq: Two times a day (BID) | ORAL | Status: DC
  Administered 2020-02-18 – 2020-02-22 (×9): 100 mg via ORAL
  Filled 2020-02-18 (×10): qty 1

## 2020-02-18 MED ORDER — HYDROCODONE-ACETAMINOPHEN 7.5-325 MG PO TABS
1.0000 | ORAL_TABLET | ORAL | Status: DC | PRN
  Administered 2020-02-20 – 2020-02-22 (×3): 2 via ORAL
  Administered 2020-02-24: 1 via ORAL
  Filled 2020-02-18: qty 2
  Filled 2020-02-18: qty 1
  Filled 2020-02-18 (×2): qty 2

## 2020-02-18 MED ORDER — ALBUMIN HUMAN 5 % IV SOLN
INTRAVENOUS | Status: AC
  Filled 2020-02-18: qty 250

## 2020-02-18 MED ORDER — KETOROLAC TROMETHAMINE 15 MG/ML IJ SOLN
7.5000 mg | Freq: Four times a day (QID) | INTRAMUSCULAR | Status: AC
  Administered 2020-02-18 – 2020-02-19 (×3): 7.5 mg via INTRAVENOUS
  Filled 2020-02-18 (×3): qty 1

## 2020-02-18 MED ORDER — MORPHINE SULFATE (PF) 2 MG/ML IV SOLN
0.5000 mg | INTRAVENOUS | Status: DC | PRN
  Administered 2020-02-22: 1 mg via INTRAVENOUS
  Filled 2020-02-18: qty 1

## 2020-02-18 MED ORDER — KETOROLAC TROMETHAMINE 15 MG/ML IJ SOLN
INTRAMUSCULAR | Status: AC
  Filled 2020-02-18: qty 1

## 2020-02-18 MED ORDER — POVIDONE-IODINE 10 % EX SWAB
2.0000 "application " | Freq: Once | CUTANEOUS | Status: AC
  Administered 2020-02-18: 2 via TOPICAL

## 2020-02-18 MED ORDER — POLYETHYLENE GLYCOL 3350 17 G PO PACK: 17.0000 g | PACK | Freq: Every day | ORAL | Status: DC | PRN

## 2020-02-18 SURGICAL SUPPLY — 48 items
BALL HIP DEPUY 53 (Hips) IMPLANT
BIT DRILL 2.0X128 (BIT) ×2 IMPLANT
BIT DRILL 2.0X128MM (BIT) ×1
BLADE SAW SAG 73X25 THK (BLADE) ×1
BLADE SAW SGTL 73X25 THK (BLADE) ×2 IMPLANT
CLOSURE STERI-STRIP 1/2X4 (GAUZE/BANDAGES/DRESSINGS) ×1
CLSR STERI-STRIP ANTIMIC 1/2X4 (GAUZE/BANDAGES/DRESSINGS) ×1 IMPLANT
COVER SURGICAL LIGHT HANDLE (MISCELLANEOUS) ×3 IMPLANT
COVER WAND RF STERILE (DRAPES) IMPLANT
DRAPE INCISE IOBAN 66X45 STRL (DRAPES) ×6 IMPLANT
DRAPE ORTHO SPLIT 77X108 STRL (DRAPES) ×6
DRAPE SURG ORHT 6 SPLT 77X108 (DRAPES) ×2 IMPLANT
DRAPE U-SHAPE 47X51 STRL (DRAPES) ×3 IMPLANT
DRESSING AQUACEL AG SP 3.5X6 (GAUZE/BANDAGES/DRESSINGS) ×1 IMPLANT
DRSG AQUACEL AG ADV 3.5X10 (GAUZE/BANDAGES/DRESSINGS) ×2 IMPLANT
DRSG AQUACEL AG SP 3.5X6 (GAUZE/BANDAGES/DRESSINGS) ×3
DURAPREP 26ML APPLICATOR (WOUND CARE) ×3 IMPLANT
ELECT REM PT RETURN 15FT ADLT (MISCELLANEOUS) ×3 IMPLANT
GLOVE BIOGEL PI IND STRL 8 (GLOVE) ×1 IMPLANT
GLOVE BIOGEL PI INDICATOR 8 (GLOVE) ×2
GOWN STRL REUS W/TWL LRG LVL3 (GOWN DISPOSABLE) ×3 IMPLANT
HANDPIECE INTERPULSE COAX TIP (DISPOSABLE) ×3
HIP BALL DEPUY 53 (Hips) ×3 IMPLANT
KIT BASIN OR (CUSTOM PROCEDURE TRAY) ×3 IMPLANT
KIT TURNOVER KIT A (KITS) IMPLANT
NS IRRIG 1000ML POUR BTL (IV SOLUTION) ×3 IMPLANT
PACK TOTAL JOINT (CUSTOM PROCEDURE TRAY) ×3 IMPLANT
PACK TOTAL KNEE CUSTOM (KITS) IMPLANT
PENCIL SMOKE EVACUATOR (MISCELLANEOUS) IMPLANT
PROTECTOR NERVE ULNAR (MISCELLANEOUS) ×3 IMPLANT
RETRIEVER SUT HEWSON (MISCELLANEOUS) ×3 IMPLANT
SET HNDPC FAN SPRY TIP SCT (DISPOSABLE) ×1 IMPLANT
SPACER DEPUY (Hips) ×2 IMPLANT
SPONGE LAP 4X18 RFD (DISPOSABLE) ×3 IMPLANT
STEM SUMMIT BASIC PRESSFIT SZ5 (Hips) ×2 IMPLANT
SUT ETHIBOND NAB CT1 #1 30IN (SUTURE) ×3 IMPLANT
SUT FIBERWIRE #2 38 T-5 BLUE (SUTURE)
SUT MNCRL AB 4-0 PS2 18 (SUTURE) ×3 IMPLANT
SUT VIC AB 1 CT1 27 (SUTURE)
SUT VIC AB 1 CT1 27XBRD ANTBC (SUTURE) IMPLANT
SUT VIC AB 2-0 CT1 27 (SUTURE) ×6
SUT VIC AB 2-0 CT1 TAPERPNT 27 (SUTURE) ×2 IMPLANT
SUT VIC AB 3-0 SH 8-18 (SUTURE) ×3 IMPLANT
SUTURE FIBERWR #2 38 T-5 BLUE (SUTURE) IMPLANT
TOWEL OR 17X26 10 PK STRL BLUE (TOWEL DISPOSABLE) ×9 IMPLANT
TOWER CARTRIDGE SMART MIX (DISPOSABLE) ×3 IMPLANT
TRAY FOLEY MTR SLVR 16FR STAT (SET/KITS/TRAYS/PACK) IMPLANT
WATER STERILE IRR 1000ML POUR (IV SOLUTION) ×3 IMPLANT

## 2020-02-18 NOTE — Progress Notes (Signed)
PROGRESS NOTE  Anthony Curry. UUV:[id number] DOB: [birth year] DOA: 02/17/2020 PCP: [name], MD   LOS: 1 day   Brief narrative: As per HPI,  Anthony Curry. is a 79 y.o. 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 fib, SIADH, hypothyroidism, depression, anxiety who was hospitalized from 1/28-01/10/20 for perforated diverticulitis and underwent sigmoid colectomy with colostomy creation,  hospital course complicated by Covid 19 pneumonia, urinary retention requiring indwelling foley, ischemic left hand requiring left brachial embolectomy on 2/1 by vascular surgery ( Dr Oneida Alar), on Eliquis since then, presented  to ED with mechanical fall /left hip pain. Patient had complicated hospital course and was discharged to SNF at last discharge but states was not getting enough help there, felt miserable and couldn't stay there long. Left home the following day. He lost 15-20 pounds weight since last admission. ED Course: Afebrile, pulse 78, respiratory rate 18, blood pressure 146/84--171/69, O2 sat 99% on room air.  WBC 8.3, hemoglobin 14.2 hematocrit 42.9, platelet 428, sodium 132, potassium 4.3, chloride 96, BUN 18, creatinine 0.68, calcium 9.4, INR 1.1, glucose 96. Hip x-ray showed left femoral neck angulated fracture, CXR with COPD changes, no acute infiltrates.  Orthopedics (Dr. Mardelle Matte) consulted, who recommended to hold Eliquis and plan for interval surgery in 48 hours.  Assessment/Plan:  Principal Problem:   Left displaced femoral neck fracture (HCC) Active Problems:   Hyponatremia   BPH (benign prostatic hyperplasia)   GERD (gastroesophageal reflux disease)   Fall   Physical deconditioning   Hypoalbuminemia   Malignant neoplasm of lower lobe of right lung (HCC)   Atrial fibrillation (HCC)   Hypertension   SIADH (syndrome of inappropriate ADH production) ([place])   Diverticulitis   Pericardial effusion  Thromboembolism (HCC)   Hip fracture (HCC)   Left Femoral neck fracture: Patient has been seen by orthopedics and the plan is  left hip hemiarthroplasty with Dr. Mardelle Matte on 3/18. Continue IV Robaxin, DC fentanyl.  Add IV morphine and Norco.  Add Senokot and MiraLAX.  Watch for sedation.  Recent complicated diverticulitis: s/p surgery and is s/p colostomy.on oral diet, will need NPO after midnight on 3/17.  Continue ostomy care.  Stage IV non-small cell lung cancer:Patient follows Dr.  Earlie Server and receives immunotherapy as outpatient.   Continue mucolytics and bronchodilators.  X-ray without acute pneumonia.  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  Patient was previously evaluated by cardiology with impression of moderate pericardial effusion likely due to lung CA and recommended continuation of anticoagulation.  Patient had repeat echocardiogram on 2/3-with no tamponade physiology.   Paroxysmal atrial fibrillation: continue Cardizem and heparin IV drip for now.  Patient was on eliquis on discharge after embolectomy by vascular surgery.  Consider Eliquis on discharge.  Patient has been seen by cardiology for preop evaluation.  Sinus rhythm at this time.  Urinary retention:Had Foley catheter placed in last admission and had failed voiding trial on 2/4- Continue Flomax, finasteride.  He was advised to follow-up urology as outpatient  SIADH/chronic hyponatremia:Likely secondary to lung cancer.  On demeclocycline.  Will continue.  Sodium of 131.  Hypothyroidism: Continue Synthroid  History of COPD/ Emphysema/recent COVID-19 pneumonia: Chest x-ray unremarkable at this time.  Any inhalers.  Received steroids edematous with the last admission.    GERD: Continue PPI  Generalized Deconditioning/debility with protein calorie malnutrition:Patient was discharged to skilled nursing facility in the last admission but decided  to go home after that.   Continue nutritional  supplements.  VTE Prophylaxis: Heparin subcu  Code Status: Full code  Family Communication: None today  Disposition Plan:  . Patient is from home . Likely disposition to skilled nursing facility . Barriers to discharge: Hip fracture awaiting for surgery   Consultants:  Orthopedics  Cardiology  Procedures:  None so far  Antibiotics:  . Demeclocycline  Anti-infectives (From admission, onward)   Start     Dose/Rate Route Frequency Ordered Stop   02/18/20 1300  ceFAZolin (ANCEF) IVPB 2g/100 mL premix     2 g 200 mL/hr over 30 Minutes Intravenous On call to O.R. 02/17/20 2239 02/19/20 0559   02/17/20 2200  demeclocycline (DECLOMYCIN) tablet 300 mg    Note to Pharmacy: Take 2 tablets twice a day     300 mg Oral 2 times daily 02/17/20 1544        Subjective: Today, patient was seen and examined at bedside.  Patient complains of pain during moving his hip.  Denies any shortness of breath, chest pain, palpitation.  Objective: Vitals:   02/18/20 0913 02/18/20 0913  BP:  (!) 105/51  Pulse:  92  Resp:    Temp:  98.9 F (37.2 C)  SpO2: 97% 92%    Intake/Output Summary (Last 24 hours) at 02/18/2020 1041 Last data filed at 02/18/2020 0807 Gross per 24 hour  Intake 97.62 ml  Output 300 ml  Net -202.38 ml   Filed Weights   02/17/20 1341 02/17/20 2048  Weight: 59 kg 57.3 kg   Body mass index is 17.62 kg/m.   Physical Exam: GENERAL: Patient is alert awake and oriented. Not in obvious distress.  Thinly built, HENT: No scleral pallor or icterus. Pupils equally reactive to light. Oral mucosa is moist NECK: is supple, no gross swelling noted. CHEST: Clear to auscultation. No crackles or wheezes.  Diminished breath sounds bilaterally. CVS: S1 and S2 heard, no murmur. Regular rate and rhythm.  ABDOMEN: Soft, non-tender, bowel sounds are present.  Foley catheter in place.  Colostomy EXTREMITIES: Left lower extremity externally rotated with hip tenderness CNS: Cranial  nerves are intact. No focal motor deficits. SKIN: warm and dry without rashes.  Data Review: I have personally reviewed the following laboratory data and studies,  CBC: Recent Labs  Lab 02/17/20 1407 02/18/20 0734  WBC 8.3 11.3*  NEUTROABS 6.1  --   HGB 14.2 12.6*  HCT 42.9 37.0*  MCV 89.7 88.5  PLT 428* 161   Basic Metabolic Panel: Recent Labs  Lab 02/17/20 1407 02/18/20 0734  NA 132* 131*  K 4.3 3.7  CL 96* 98  CO2 27 25  GLUCOSE 96 111*  BUN 18 16  CREATININE 0.68 0.65  CALCIUM 9.4 8.7*   Liver Function Tests: Recent Labs  Lab 02/17/20 1407  AST 27  ALT 24  ALKPHOS 123  BILITOT 0.6  PROT 6.5  ALBUMIN 3.7   No results for input(s): LIPASE, AMYLASE in the last 168 hours. No results for input(s): AMMONIA in the last 168 hours. Cardiac Enzymes: No results for input(s): CKTOTAL, CKMB, CKMBINDEX, TROPONINI in the last 168 hours. BNP (last 3 results) Recent Labs    06/28/19 0646 01/05/20 1119  BNP 67.4 109.6*    ProBNP (last 3 results) No results for input(s): PROBNP in the last 8760 hours.  CBG: No results for input(s): GLUCAP in the last 168 hours. Recent Results (from the past 240 hour(s))  Surgical pcr screen  Status: Abnormal   Collection Time: 02/17/20 10:52 PM   Specimen: Nasal Mucosa; Nasal Swab  Result Value Ref Range Status   MRSA, PCR NEGATIVE NEGATIVE Final   Staphylococcus aureus POSITIVE (A) NEGATIVE Final    Comment: (NOTE) The Xpert SA Assay (FDA approved for NASAL specimens in patients 38 years of age and older), is one component of a comprehensive surveillance program. It is not intended to diagnose infection nor to guide or monitor treatment. Performed at [facility], [place] [address] [place], [place] [address]      Studies: DG Chest 1 View  Result Date: 02/17/2020 CLINICAL DATA:  Cough for the past week. EXAM: CHEST  1 VIEW COMPARISON:  CT chest dated January 23, 2020. Chest x-ray dated January 05, 2020. FINDINGS: The heart size and mediastinal contours are within normal limits. Atherosclerotic calcification of the aortic arch. Normal pulmonary vascularity. The lungs are hyperinflated with emphysematous changes. No focal consolidation, pleural effusion, or pneumothorax. No acute osseous abnormality. Old bilateral rib fractures and right proximal humerus fracture again noted. Prior left total shoulder arthroplasty. IMPRESSION: 1. No active disease. 2. COPD. Electronically Signed   By: Titus Dubin M.D.   On: 02/17/2020 13:39   DG Hip Unilat With Pelvis 2-3 Views Left  Result Date: 02/17/2020 CLINICAL DATA:  Fall, hip pain EXAM: DG HIP (WITH OR WITHOUT PELVIS) 2-3V LEFT COMPARISON:  None. FINDINGS: Fracture of the LEFT femoral neck with varus angulation. No dislocation. Sclerotic lesion in the LEFT inferior pubic ramus measuring 2 cm appears benign on comparison CT 01/05/2020 IMPRESSION: LEFT femoral neck fracture. Electronically Signed   By: Suzy Bouchard M.D.   On: 02/17/2020 13:37      Flora Lipps, MD  [facility] 02/18/2020

## 2020-02-18 NOTE — Discharge Instructions (Signed)
Diet: As you were doing prior to hospitalization   Shower:  May shower but keep the wounds dry, use an occlusive plastic wrap, NO SOAKING IN TUB.  If the bandage gets wet, change with a clean dry gauze.  If you have a splint on, leave the splint in place and keep the splint dry with a plastic bag.  Dressing:  You may change your dressing 3-5 days after surgery, unless you have a splint.  If you have a splint, then just leave the splint in place and we will change your bandages during your first follow-up appointment.    If you had hand or foot surgery, we will plan to remove your stitches in about 2 weeks in the office.  For all other surgeries, there are sticky tapes (steri-strips) on your wounds and all the stitches are absorbable.  Leave the steri-strips in place when changing your dressings, they will peel off with time, usually 2-3 weeks.  Activity:  Increase activity slowly as tolerated, but follow the weight bearing instructions below.  The rules on driving is that you can not be taking narcotics while you drive, and you must feel in control of the vehicle.    Weight Bearing:   Weight bearing as tolerated.    To prevent constipation: you may use a stool softener such as -  Colace (over the counter) 100 mg by mouth twice a day  Drink plenty of fluids (prune juice may be helpful) and high fiber foods Miralax (over the counter) for constipation as needed.    Itching:  If you experience itching with your medications, try taking only a single pain pill, or even half a pain pill at a time.  You may take up to 10 pain pills per day, and you can also use benadryl over the counter for itching or also to help with sleep.   Precautions:  If you experience chest pain or shortness of breath - call 911 immediately for transfer to the hospital emergency department!!  If you develop a fever greater that 101 F, purulent drainage from wound, increased redness or drainage from wound, or calf pain -- Call  the office at [phone]                                                Follow- Up Appointment:  Please call for an appointment to be seen in 2 weeks [place] - [phone]

## 2020-02-18 NOTE — Telephone Encounter (Signed)
Wife stopped by Dr Worthy Flank desk to let him know Mr Fronczak fell and broke his hip. Is to have surgery. Will probably not be able to come to appts next week.

## 2020-02-18 NOTE — Anesthesia Procedure Notes (Signed)
Procedure Name: Intubation Date/Time: 02/18/2020 5:10 PM Performed by: Lissa Morales, CRNA Pre-anesthesia Checklist: Patient identified, Emergency Drugs available, Suction available and Patient being monitored Patient Re-evaluated:Patient Re-evaluated prior to induction Oxygen Delivery Method: Circle system utilized Preoxygenation: Pre-oxygenation with 100% oxygen Induction Type: IV induction and Rapid sequence Laryngoscope Size: Mac and 4 Grade View: Grade I Tube type: Oral Tube size: 8.0 mm Number of attempts: 1 Airway Equipment and Method: Stylet and Oral airway Placement Confirmation: ETT inserted through vocal cords under direct vision,  positive ETCO2 and breath sounds checked- equal and bilateral Secured at: 23 cm Tube secured with: Tape Dental Injury: Teeth and Oropharynx as per pre-operative assessment  Comments: Rapid sequence with glidescope 2ndary to covid 01/01/20. Febrile with productive cough

## 2020-02-18 NOTE — Consult Note (Addendum)
Cardiology Consultation:   Patient ID: Anthony Curry. MRN: [id number]; DOB: 1941-02-19  Admit date: 02/17/2020 Date of Consult: 02/18/2020  Primary Care Provider: [name], MD Primary Cardiologist: Evalina Field, MD  Primary Electrophysiologist:  None    Patient Profile:   Anthony Curry. is a 79 y.o. male with a hx of stage IV lung CA (chemo, radiation, immunotherapy), SIADH, treated adrenal mass, paroxysmal Afib on Eliqus, remote alcoholism (quit 10 years ago) and tobacco use (quit 25 years ago), anemia of chronic disease, BPH, GERD, HTN, pulmonary nodules, arthritis, unsteadiness/falls, chronic pericardial effusion, and recent hospitalization for perforated diverticulitis s/p colostomy which was complicated by covid PNA and ischemic left hand who is being seen today for the evaluation of pre-op evaluation at the request of Dr. Louanne Belton.  History of Present Illness:   Anthony Curry was diagnosed with Afib May 2015 in the setting of lung cancer treatment and underwent successfful cardioversion. He was started on Eliquis. 2D echo in 2015 showed EF 55-60%, moderately dilated LA, mildly dilated RA, and small pericardial effusion. He was then followed by Dr. [name] and the Afib clinic in 2016 and 2017. He  More recently he was by cardiology 1/29 for moderate pericardial effusion (per CT/echo) during an admission 1/28-2/5 for perforated diverticulitis s/p sigmoid colectomy with colostomy creation. Echo during the admission showed EF 60-65% with G1DD and large pericardial effusion. Follow-up limited echo 2/3 showed moderate pericardial effusion with no significant change, no evidence of tamponade. The pericardial effusion was thought to be chronic given his h/o of cancer and no further work-up was performed and continuation of anticoagulation was recommended. The hospitalization was complicated by Covid PNA, ischemic left hand requiring left brachial embolectomy 2/1 by vascular. After the  vascular procedure he was restarted on his Eliquis. Patient was discharged to SNF but felt they were unprepared to care for him and so he went back to the ED who discharged him home. He has since been living at his home with his wife. His daughter and son have been taking care of him and helping to change his colostomy bag. He has lost about 15-20 lbs. Home health PT come in twice a week. He still feels very unsteady on his feet and has sob on exertion.   He returned to the ED 02/17/20 for a mechanical fall. He was at home when the doorbell rang and he started getting up to answer it. He has been sitting on the couch which was leather and when he tried to stand up he slipped off and fell to the ground and landed on his left hip. He felt immediate pain in his hip and was unable to move his left leg or walk. EMS was called who brought him to the ED. In the ED BP elevated, 146/85, pulse 82, RR 14, O2 96%. Labs remarkable for sodium 132 INR 1.1. EKG with NSR, 78 bpm, possible first degree AV block. Hip x-ray showed left femoral neck angulated fx. CXR showed COPD and no acute changes. The last time patient took Eliquis was yesterday morning. Orthopedics was consulted for possible surgery.    Past Medical History:  Diagnosis Date  . 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[place])   . Adrenal mass ([place])   . Alcoholism in remission ([place])   . Anemia of chronic disease   . 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 Anxiety   . BPH (benign prostatic hyperplasia)   . Cancer ([place])    small cell/lung  . Complication  of anesthesia 09/08/2015    JITTERY AFTER GALLBLADDER SURGERY  . GERD (gastroesophageal reflux disease)   . History of radiation therapy 11/14/16, 11/18/16, 11/22/16   Left upper lung: 54 Gy in 3 fractions  . Hypertension   . Neoplastic malignant related fatigue    IV tx. every 2 weeks last9-14-16 (Dr. Earlie Server), radiation last tx. 6 months  . 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 Pericardial  effusion   . Persistent atrial fibrillation ([place])   . Pulmonary nodules   . Radiation 12/02/14-12/16/14   Left adrenal gland 30 Gy in 10 fractions    Past Surgical History:  Procedure Laterality Date  . ANKLE ARTHRODESIS  1995   rt fx-hardware in  . CARDIOVERSION N/A 09/29/2014   Procedure: CARDIOVERSION;  Surgeon: Josue Hector, MD;  Location[facility] ENDOSCOPY;  Service: Cardiovascular;  Laterality: N/A;  . CHOLECYSTECTOMY  09/08/2015    laproscopic   . CHOLECYSTECTOMY N/A 09/08/2015   Procedure: LAPAROSCOPIC CHOLECYSTECTOMY WITH ATTEMPTED INTRAOPERATIVE CHOLANGIOGRAM ;  Surgeon: Fanny Skates, MD;  Location: [place];  Service: General;  Laterality: N/A;  . COLONOSCOPY    . EMBOLECTOMY Left 01/06/2020   Procedure: Embolectomy left axillary, brachial, radial, and ulnar artery;  Surgeon: Elam Dutch, MD;  Location: [facility] OR;  Service: Vascular;  Laterality: Left;  . ERCP N/A 09/02/2015   Procedure: ENDOSCOPIC RETROGRADE CHOLANGIOPANCREATOGRAPHY (ERCP);  Surgeon: Arta Silence, MD;  Location: [facility] ENDOSCOPY;  Service: Endoscopy;  Laterality: N/A;  . ERCP N/A 09/07/2015   Procedure: ENDOSCOPIC RETROGRADE CHOLANGIOPANCREATOGRAPHY (ERCP);  Surgeon: Clarene Essex, MD;  Location: [facility] ENDOSCOPY;  Service: Endoscopy;  Laterality: N/A;  . EXCISION / CURETTAGE BONE CYST FINGER  2005   rt thumb  . HEMORRHOID SURGERY    . LAPAROTOMY N/A 01/05/2020   Procedure: EXPLORATORY LAPAROTOMY sigmoid colon resection creation end colostomy for perforated diverticulitis and purulent peritonitis;  Surgeon: Kieth Brightly Arta Bruce, MD;  Location: [facility] ORS;  Service: General;  Laterality: N/A;  . Percutaneous Cholecytostomy tube     IVR insertion 06-13-15(Dr. Vernard Gambles)- 08-28-15 remains in place to drainage bag  . TONSILLECTOMY    . TOTAL SHOULDER ARTHROPLASTY  02/03/2012   Procedure: TOTAL SHOULDER ARTHROPLASTY;  Surgeon: [name], MD;  Location: [place];  Service: Orthopedics;  Laterality: Left;     Home  Medications:  Prior to Admission medications   Medication Sig Start Date End Date Taking? Authorizing Provider  ALPRAZolam Duanne Moron) 0.5 MG tablet Take 1 tablet (0.5 mg total) by mouth at bedtime as needed. 01/10/20  Yes Ghimire, Henreitta Leber, MD  apixaban (ELIQUIS) 5 MG TABS tablet Take 5 mg by mouth 2 (two) times daily.   Yes [provider]  demeclocycline (DECLOMYCIN) 150 MG tablet Take 2 tablets twice a day Patient taking differently: Take 300 mg by mouth 2 (two) times daily.  07/09/19  Yes Heilingoetter, Cassandra L, PA-C  diltiazem (CARDIZEM CD) 180 MG 24 hr capsule Take 1 capsule (180 mg total) by mouth daily at 12 noon. 01/09/15  Yes Sherran Needs, NP  fluticasone (FLONASE) 50 MCG/ACT nasal spray Place 2 sprays into both nostrils at bedtime.  10/17/19  Yes [provider]  HYDROcodone-acetaminophen (NORCO/VICODIN) 5-325 MG tablet Take 1 tablet by mouth 3 (three) times daily as needed for moderate pain. 01/28/20  Yes [name], MD  KLOR-CON M20 20 MEQ tablet Take 20 mEq by mouth daily at 12 noon.  10/18/14  Yes [provider]  Lactobacillus (ACIDOPHILUS) CAPS capsule Take 1 capsule  by mouth daily.   Yes [provider]  levothyroxine (SYNTHROID, LEVOTHROID) 50 MCG tablet TAKE 1 TABLET (50 MCG TOTAL) BY MOUTH DAILY BEFORE BREAKFAST. 03/24/18  Yes [name], MD  MAGNESIUM PO Take 400 mg by mouth daily.    Yes [provider]  mirtazapine (REMERON) 30 MG tablet TAKE 1 TABLET(30 MG) BY MOUTH AT BEDTIME Patient taking differently: Take 30 mg by mouth at bedtime.  11/28/19  Yes [name], MD  Nivolumab (OPDIVO IV) Inject as directed every 30 (thirty) days. Receives 480mg  every 28 days.   Yes [provider]  Nutritional Supplements (ENSURE COMPLETE PO) Take 1 Can by mouth 2 (two) times daily.   Yes [provider]  Omega-3 Fatty Acids (FISH OIL PO) Take 1,000 mg by mouth daily.    Yes [provider]  OVER THE  COUNTER MEDICATION Take 10 mLs by mouth daily. Over the counter liquid multivitamin   Yes [provider]  tamsulosin (FLOMAX) 0.4 MG CAPS capsule Take 1 capsule (0.4 mg total) by mouth 2 (two) times daily. 01/10/20  Yes Ghimire, Henreitta Leber, MD  finasteride (PROSCAR) 5 MG tablet Take 1 tablet (5 mg total) by mouth daily. Patient not taking: Reported on 02/17/2020 01/10/20   Jonetta Osgood, MD  levofloxacin (LEVAQUIN) 500 MG tablet Take 1 tablet (500 mg total) by mouth daily. Patient not taking: Reported on 02/17/2020 01/30/20   Pattricia Boss, MD  thiamine (VITAMIN B-1) 100 MG tablet Take 1 tablet (100 mg total) by mouth daily. Patient not taking: Reported on 02/17/2020 09/11/13   Erick Colace, NP    Inpatient Medications: Scheduled Meds: . acetaminophen  1,000 mg Oral Once  . acidophilus  1 capsule Oral Daily  . chlorhexidine  60 mL Topical Once  . Chlorhexidine Gluconate Cloth  6 each Topical Daily  . demeclocycline  300 mg Oral BID  . diltiazem  180 mg Oral Q1200  . feeding supplement (ENSURE ENLIVE)  237 mL Oral BID  . fluticasone  2 spray Each Nare QHS  . guaiFENesin  600 mg Oral BID  . ipratropium-albuterol  3 mL Nebulization TID  . levothyroxine  50 mcg Oral QAC breakfast  . magnesium oxide  400 mg Oral Daily  . mirtazapine  30 mg Oral QHS  . multivitamin with minerals  1 tablet Oral Daily  . omega-3 acid ethyl esters  1 g Oral Daily  . potassium chloride SA  20 mEq Oral Q1200  . povidone-iodine  2 application Topical Once  . tamsulosin  0.4 mg Oral BID  . thiamine  100 mg Oral Daily   Continuous Infusions: .  ceFAZolin (ANCEF) IV    . heparin 1,000 Units/hr (02/17/20 2205)  . methocarbamol (ROBAXIN) IV 500 mg (02/17/20 1856)   PRN Meds: ALPRAZolam, benzonatate, fentaNYL (SUBLIMAZE) injection, HYDROcodone-acetaminophen, ipratropium-albuterol, methocarbamol (ROBAXIN) IV, ondansetron **OR** ondansetron (ZOFRAN) IV  Allergies:   No Known Allergies  Social History:    Social History   Socioeconomic History  . Marital status: Married    Spouse name: Xylon Croom  . Number of children: 3  . Years of education: 47  . Highest education level: Not on file  Occupational History  . 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 Smoking status: Former Smoker    Packs/day: 2.00    Years: 30.00    Pack years: 60.00    Types: Cigarettes    Quit date: 12/05/1990  Years since quitting: 29.2  . Smokeless tobacco: Never Used  Substance and Sexual Activity  . Alcohol use: Yes    Alcohol/week: 0.0 standard drinks    Comment: past hx.ETOH abuse,08-31-15 "occ. wine ,drinks non alcoholic beer occ."  . Drug use: No  . Sexual activity: Not Currently  Other Topics Concern  . Not on file  Social History Narrative   Lives at home with wife.   Caffeine use: Drinks coffee- 2 cups per day   Retired from [employer] Strain:   . Difficulty of Paying Living Expenses:   Food Insecurity:   . Worried About Charity fundraiser in the Last Year:   . Arboriculturist in the Last Year:   Transportation Needs:   . Film/video editor (Medical):   [name] Lack of Transportation (Non-Medical):   Physical Activity:   . Days of Exercise per Week:   . Minutes of Exercise per Session:   Stress:   . Feeling of Stress :   Social Connections:   . Frequency of Communication with Friends and Family:   . Frequency of Social Gatherings with Friends and Family:   . Attends Religious Services:   . Active Member of Clubs or Organizations:   . Attends Archivist Meetings:   [name] Marital Status:   Intimate Partner Violence:   . Fear of Current or Ex-Partner:   . Emotionally Abused:   [name] Physically Abused:   . Sexually Abused:     Family History:   Family History  Problem Relation Age of Onset  . Lung cancer Mother   . Hypertension Father   . Heart attack Father   . Diabetes  Paternal Grandmother   . Brain cancer Brother   . Heart attack Brother   . Neuropathy Neg Hx      ROS:  Please see the history of present illness.  All other ROS reviewed and negative.     Physical Exam/Data:   Vitals:   02/17/20 2043 02/17/20 2048 02/18/20 0230 02/18/20 0540  BP: (!) 152/72  (!) 120/57 131/70  Pulse: 100  98 (!) 105  Resp: 18  20 19   Temp: 98.4 F (36.9 C)  98.3 F (36.8 C) 98.6 F (37 C)  TempSrc: Oral  Oral Oral  SpO2: 96%  94% 97%  Weight:  57.3 kg    Height:  5\' 11"  (1.803 m)      Intake/Output Summary (Last 24 hours) at 02/18/2020 0859 Last data filed at 02/18/2020 0807 Gross per 24 hour  Intake 97.62 ml  Output 300 ml  Net -202.38 ml   Last 3 Weights 02/17/2020 02/17/2020 02/06/2020  Weight (lbs) 126 lb 5.2 oz 130 lb 134 lb  Weight (kg) 57.3 kg 58.968 kg 60.782 kg     Body mass index is 17.62 kg/m.  General:  Well nourished, well developed, in no acute distress HEENT: normal Lymph: no adenopathy Neck: no JVD Endocrine:  No thryomegaly Vascular: No carotid bruits; FA pulses 2+ bilaterally without bruits  Cardiac:  normal S1, S2; RRR; no murmur  Lungs: Diminished sounds at bases Abd: soft, nontender, no hepatomegaly  Ext: no edema Musculoskeletal:  No deformities, BUE and BLE strength normal and equal Skin: warm and dry  Neuro:  CNs 2-12 intact, no focal abnormalities noted Psych:  Normal affect   EKG:  The EKG was personally reviewed and demonstrates:  NSR, 78 bpm, possible first degree AV  block Telemetry:  Telemetry was personally reviewed and demonstrates:  NSR, HR around 100, PACs and PVCs  Relevant CV Studies:  Echo limited 01/08/20 1. Left ventricular ejection fraction, by visual estimation, is 55 to  60%.  2. Moderate to large pericardial effusion. No significant change since  the last exam 01/02/20 on side by side comparison. No RV diastolic  collapse. Suboptimal Doppler alignment of mitral inflow, no definite  respiratory  variability. Hypertensive and  normal heart rate. These findings suggest no evidence of tamponade  physiology. Hepatic vein Doppler not performed. IVC not visualized.  Borderline respiratory related leftward ventricular septal shift.   Echo 01/02/20 1. Left ventricular ejection fraction, by visual estimation, is 60 to  65%. The left ventricle has normal function. There is no left ventricular  hypertrophy.  2. Left ventricular diastolic parameters are consistent with Grade I  diastolic dysfunction (impaired relaxation).  3. Global right ventricle has normal systolic function.The right  ventricular size is normal. No increase in right ventricular wall  thickness.  4. Left atrial size was normal.  5. Right atrial size was normal.  6. Large pericardial effusion.  7. The pericardial effusion is anterior to the right ventricle.  8. There is inversion of the right ventricular wall and excessive  respiratory variation in the tricuspid valve spectral Doppler velocities.  9. The mitral valve is normal in structure. Mild mitral valve  regurgitation. No evidence of mitral stenosis.  10. The tricuspid valve is normal in structure.  11. The tricuspid valve is normal in structure. Tricuspid valve  regurgitation is mild.  12. The aortic valve is normal in structure. Aortic valve regurgitation is  not visualized. No evidence of aortic valve sclerosis or stenosis.  13. The pulmonic valve was normal in structure. Pulmonic valve  regurgitation is not visualized.  14. Mildly elevated pulmonary artery systolic pressure.  15. The inferior vena cava is normal in size with greater than 50%  respiratory variability, suggesting right atrial pressure of 3 mmHg.   Laboratory Data:  High Sensitivity Troponin:  No results for input(s): TROPONINIHS in the last 720 hours.   Chemistry Recent Labs  Lab 02/17/20 1407 02/18/20 0734  NA 132* 131*  K 4.3 3.7  CL 96* 98  CO2 27 25  GLUCOSE 96 111*  BUN  18 16  CREATININE 0.68 0.65  CALCIUM 9.4 8.7*  GFRNONAA >60 >60  GFRAA >60 >60  ANIONGAP 9 8    Recent Labs  Lab 02/17/20 1407  PROT 6.5  ALBUMIN 3.7  AST 27  ALT 24  ALKPHOS 123  BILITOT 0.6   Hematology Recent Labs  Lab 02/17/20 1407 02/18/20 0734  WBC 8.3 11.3*  RBC 4.78 4.18*  HGB 14.2 12.6*  HCT 42.9 37.0*  MCV 89.7 88.5  MCH 29.7 30.1  MCHC 33.1 34.1  RDW 14.6 14.8  PLT 428* 379   BNPNo results for input(s): BNP, PROBNP in the last 168 hours.  DDimer No results for input(s): DDIMER in the last 168 hours.   Radiology/Studies:  DG Chest 1 View  Result Date: 02/17/2020 CLINICAL DATA:  Cough for the past week. EXAM: CHEST  1 VIEW COMPARISON:  CT chest dated January 23, 2020. Chest x-ray dated January 05, 2020. FINDINGS: The heart size and mediastinal contours are within normal limits. Atherosclerotic calcification of the aortic arch. Normal pulmonary vascularity. The lungs are hyperinflated with emphysematous changes. No focal consolidation, pleural effusion, or pneumothorax. No acute osseous abnormality. Old bilateral rib fractures and right  proximal humerus fracture again noted. Prior left total shoulder arthroplasty. IMPRESSION: 1. No active disease. 2. COPD. Electronically Signed   By: Titus Dubin M.D.   On: 02/17/2020 13:39   DG Hip Unilat With Pelvis 2-3 Views Left  Result Date: 02/17/2020 CLINICAL DATA:  Fall, hip pain EXAM: DG HIP (WITH OR WITHOUT PELVIS) 2-3V LEFT COMPARISON:  None. FINDINGS: Fracture of the LEFT femoral neck with varus angulation. No dislocation. Sclerotic lesion in the LEFT inferior pubic ramus measuring 2 cm appears benign on comparison CT 01/05/2020 IMPRESSION: LEFT femoral neck fracture. Electronically Signed   By: Suzy Bouchard M.D.   On: 02/17/2020 13:37   {  Assessment and Plan:   Pre-op evaluation Plan for hip hemiarthroplasty with Dr. Mardelle Matte today. Eliquis was held in anticipation of surgery started on IV heparin -  Cardiac history includes long history of paroxysmal Afib and chronic pericardial effusion - Afib is longstanding dx on diltiazem and on chronic anticoagulation. Remains in NSR. - Pericardial effusion recently evaluated by cards last admission and felt to be chronic given h/o of cancer. So far no signs of tamponade.  - Patient has no history of MI or stent. He denies chest pain on exertion. Does have sob which is expected after recent Covid PNA and deconditioning. Most recent echo showed preserved EF with no wall motion abnormalities. - Prior to his hospitalization January 2021 he was relatively active. He was able to walk 1-2 blocks and up a small flight of stairs. He would use a walker sometimes for support. Since his discharge he has not been very functional. He feels unsteady on his feet using a walker or wheelchair. He needs assistance to perform ADLS. He is getting home health PT twice weekly. - METS are less than 4. He has not been functional since his last discharge - According to Revised Cardiac Risk Index he is a Class 1 Risk, 6% for 30 day risk of death, MI, or cardiac arrest. Given co-morbidities and recent hospitalization would think he at moderate risk for surgery.  - MD to see  Pericardial Effusion - evaluated by Dr. Audie Box during last admission and felt this was chronic given his cancer h/o and continuation of anticoagulation was recommended - Follow-up limited echo 01/08/20 showed showed moderate pericardial effusion with no significant change, no evidence if tamponade.  - Pressures have been labile during admission>> elevated on arrival, most recent 105/51. Pulse has been around 100 bpm.   Paroxysmal Afib s/p DCCV in 2015 - Takes Eliquis for a/c>>now on IV heparin for procedure - Patient remains in  NSR.  - Cardizem 180mg  daily for rate control>>continued on admission - Restart Eliquis post-procedure - Monitor for reoccurrence - CHADSVASC = 4 (agex2, HTN, PAD) This patients  CHA2DS2-VASc Score and unadjusted Ischemic Stroke Rate (% per year) is equal to 4.8 % stroke rate/year from a score of 4  Above score calculated as 1 point each if present [CHF, HTN, DM, Vascular=MI/PAD/Aortic Plaque, Age if 65-74, or Male] Above score calculated as 2 points each if present [Age > 75, or Stroke/TIA/TE]    For questions or updates, please contact [facility] Please consult [email or web address] for contact info under     Signed, Cadence Ninfa Meeker, PA-C  02/18/2020 8:59 AM   As above, patient seen and examined.  Briefly he is an 79 year old male with multiple medical problems including stage IV lung cancer, SIADH, paroxysmal atrial fibrillation, hypertension, chronic pericardial fusion, recent surgery/colostomy for perforated diverticulitis complicated by covid pneumonia  and ischemic left hand for preoperative evaluation prior to repair of left femoral neck fracture.  Patient fell fracturing his femur.  Prior to that he was recovering from a recent hospitalization as outlined above.  He denies dyspnea, chest pain or syncope.  He has had problems with weakness.  Patient has a chronic pericardial effusion with most recent echocardiogram January 08, 2020 showing normal LV function, moderate to large pericardial effusion but no tamponade physiology.  Electrocardiogram shows sinus rhythm with first-degree AV block.  1 preoperative evaluation prior to repair of femoral neck fracture-patient denies chest pain or dyspnea and tolerated recent abdominal surgery without complication.  He does have a pericardial effusion which appears to be moderate upon my review.  Clinically he is not in tamponade with no JVD, normal blood pressure and not significantly tachycardic.  Given his multiple medical problems he will be at increased risk but would not pursue further cardiac evaluation preoperatively.  2 paroxysmal atrial fibrillation-patient remains in sinus rhythm today.  He he recently had a presumed  embolic event to his left upper extremity requiring surgical intervention.  His apixaban has been discontinued for surgery.  I agree with IV heparin to cover while off of this medication and resuming following his surgery.  Continue Cardizem at present dose.  3 left femoral neck fracture-Per orthopedics.  4 stage IV non-small cell lung cancer-follow-up oncology.  5 pericardial effusion-most recent is moderate by my review.  Clinically he is not in tamponade and pericardial effusion is chronic.  Kirk Ruths

## 2020-02-18 NOTE — Progress Notes (Signed)
ANTICOAGULATION CONSULT NOTE - Follow Up Consult  Pharmacy Consult for heparin Indication: hx afib and ischemic left hand (PTA Eliquis on hold)  No Known Allergies  Patient Measurements: Height: 5\' 11"  (180.3 cm) Weight: 126 lb 5.2 oz (57.3 kg) IBW/kg (Calculated) : 75.3 Heparin Dosing Weight: 57 kg  Vital Signs: Temp: 98.6 F (37 C) (03/16 0540) Temp Source: Oral (03/16 0540) BP: 131/70 (03/16 0540) Pulse Rate: 105 (03/16 0540)  Labs: Recent Labs    02/17/20 1407 02/17/20 2101 02/18/20 0734  HGB 14.2  --  12.6*  HCT 42.9  --  37.0*  PLT 428*  --  379  APTT  --  35  --   LABPROT 14.1  --   --   INR 1.1  --   --   HEPARINUNFRC  --  1.80*  --   CREATININE 0.68  --   --     Estimated Creatinine Clearance: 59.7 mL/min (by C-G formula based on SCr of 0.68 mg/dL).   Medications:  - on Eliquis 5 mg bid PTA (last dose on 3/15 at ~0930)  Assessment: Patient's an 78 y.o M with hx afib and ischemic left hand (s/p left brachial embolectomy on 2/1) on Eliquis PTA, presented to the ED on 3/15 s/p fall at home.  She was found to have left femoral neck fx with  plan for repair in OR on 3/16.  He was transitioned to heparin on admission in anticipation for OR procedure.  Today, 02/18/2020: - heparin level and aPTT drawn at 0734 were elevated, but samples were collected at incorrect location on arm with heparin drip infusing.  Repeat heparin level at 1013 came back as 1.12 (elevated from Eliquis) and aPTT 82 (therapeutic) --> these were collected prior to drip turned off in anticipation for OR procedure - hgb down 12.6, plts 379 - no bleeding documented  Goal of Therapy:  Heparin level 0.3-0.7 units/ml aPTT 66-102 seconds Monitor platelets by anticoagulation protocol: Yes   Plan:  - heparin drip stopped ~1030 for surgery later today - Please advise if/when anticoagulant can be resumed for patient after procedure  Mayte Diers P 02/18/2020,8:18 AM

## 2020-02-18 NOTE — Anesthesia Preprocedure Evaluation (Addendum)
Anesthesia Evaluation  Patient identified by MRN, date of birth, ID band Patient awake    Reviewed: Allergy & Precautions, H&P , NPO status , Patient's Chart, lab work & pertinent test results  Airway Mallampati: II  TM Distance: >3 FB Neck ROM: Full    Dental no notable dental hx. (+) Poor Dentition, Dental Advisory Given   Pulmonary neg pulmonary ROS, former smoker,    Pulmonary exam normal breath sounds clear to auscultation       Cardiovascular hypertension, + dysrhythmias Atrial Fibrillation  Rhythm:Irregular Rate:Tachycardia     Neuro/Psych Anxiety negative neurological ROS     GI/Hepatic Neg liver ROS, GERD  ,  Endo/Other  negative endocrine ROS  Renal/GU negative Renal ROS  negative genitourinary   Musculoskeletal  (+) Arthritis ,   Abdominal   Peds  Hematology  (+) Blood dyscrasia, anemia ,   Anesthesia Other Findings   Reproductive/Obstetrics negative OB ROS                            Anesthesia Physical Anesthesia Plan  ASA: III  Anesthesia Plan: General   Post-op Pain Management:    Induction: Intravenous  PONV Risk Score and Plan: 3 and Ondansetron, Dexamethasone and Treatment may vary due to age or medical condition  Airway Management Planned: Oral ETT  Additional Equipment:   Intra-op Plan:   Post-operative Plan: Extubation in OR  Informed Consent: I have reviewed the patients History and Physical, chart, labs and discussed the procedure including the risks, benefits and alternatives for the proposed anesthesia with the patient or authorized representative who has indicated his/her understanding and acceptance.     Dental advisory given  Plan Discussed with: CRNA  Anesthesia Plan Comments:         Anesthesia Quick Evaluation

## 2020-02-18 NOTE — Progress Notes (Signed)
CRITICAL VALUE ALERT  Critical Value:  PTT >200  Date & Time Notified:  3/16 @ 0914  Provider Notified: Dr. Louanne Belton  Orders Received/Actions taken: Awaiting orders

## 2020-02-18 NOTE — Transfer of Care (Signed)
Immediate Anesthesia Transfer of Care Note  Patient: Anthony Curry.  Procedure(s) Performed: LEFT HIP HEMIARTHROPLASTY (Left Hip)  Patient Location: PACU  Anesthesia Type:General  Level of Consciousness: awake and alert   Airway & Oxygen Therapy: Patient Spontanous Breathing and Patient connected to face mask oxygen  Post-op Assessment: Report given to RN and Post -op Vital signs reviewed and stable  Post vital signs: Reviewed and stable  Last Vitals:  Vitals Value Taken Time  BP 151/73 02/18/20 1917  Temp    Pulse 120 02/18/20 1923  Resp 32 02/18/20 1923  SpO2 88 % 02/18/20 1923  Vitals shown include unvalidated device data.  Last Pain:  Vitals:   02/18/20 1617  TempSrc:   PainSc: 7       Patients Stated Pain Goal: 3 (18/84/16 6063)  Complications: No apparent anesthesia complications

## 2020-02-18 NOTE — Anesthesia Postprocedure Evaluation (Signed)
Anesthesia Post Note  Patient: Anthony Curry.  Procedure(s) Performed: LEFT HIP HEMIARTHROPLASTY (Left Hip)     Patient location during evaluation: PACU Anesthesia Type: General Level of consciousness: awake and alert Pain management: pain level controlled Vital Signs Assessment: post-procedure vital signs reviewed and stable Respiratory status: spontaneous breathing, nonlabored ventilation, respiratory function stable and patient connected to nasal cannula oxygen Cardiovascular status: blood pressure returned to baseline and stable Postop Assessment: no apparent nausea or vomiting Anesthetic complications: no    Last Vitals:  Vitals:   02/18/20 1930 02/18/20 1945  BP:    Pulse:  (!) (P) 117  Resp: (!) (P) 21   Temp:    SpO2:      Last Pain:  Vitals:   02/18/20 1945  TempSrc:   PainSc: (P) Asleep                 Kaileena Obi,W. EDMOND

## 2020-02-18 NOTE — Op Note (Signed)
02/17/2020 - 02/18/2020  6:26 PM  PATIENT:  Anthony Curry.   MRN: [id number]  PRE-OPERATIVE DIAGNOSIS: Displaced left femoral neck fracture  POST-OPERATIVE DIAGNOSIS: Same  PROCEDURE:  Procedure(s): LEFT HIP HEMIARTHROPLASTY  PREOPERATIVE INDICATIONS:  Rubert Frediani. is an 79 y.o. male who was admitted 02/17/2020 with a diagnosis of Left displaced femoral neck fracture ([place]) and elected for surgical management.  The risks benefits and alternatives were discussed with the patient including but not limited to the risks of nonoperative treatment, versus surgical intervention including infection, bleeding, nerve injury, periprosthetic fracture, the need for revision surgery, dislocation, leg length discrepancy, blood clots, cardiopulmonary complications, morbidity, mortality, among others, and they were willing to proceed.  Predicted outcome is good, although there will be at least a six to nine month expected recovery.   OPERATIVE REPORT     SURGEON:  Marchia Bond, MD    ASSISTANT:  Merlene Pulling, PA-C,   (Present throughout the entire procedure,  necessary for completion of procedure in a timely manner, assisting with retraction, instrumentation, and closure)     ANESTHESIA: General  Specimens: The femoral head was sent for pathology.  ESTIMATED BLOOD LOSS: 876 mL    COMPLICATIONS:  None.   UNIQUE ASPECTS OF THE CASE: Throughout the case he had activation of his ankle plantar and dorsiflexion, that seemed to be unrelated to any positioning or instrumentation.  I think it was related to his coughing.  He was intubated, but did cough throughout the case.  I visualized and palpated the sciatic nerve at the completion of the case, and it felt intact.  The capsule was fairly substantial, and had an excellent repair.  When I dislocated the trial, the broach was slightly rotationally unstable, but with just subtle further placement of the broach it stabilized, and I did not feel that  going to a 6 was going to be beneficial, his bone quality was poor, and I feared causing a periprosthetic fracture.  The 5 stem ultimately sat just a half a millimeter with the collar off of the calcar, with excellent rotational stability.  The -3 seemed to restore leg lengths, and was extremely stable, I did not think I had to go any longer.  COMPONENTS:  Chemical engineer Femoral Fracture stem size 5, with a -3 spacer and a size 52 fracture head unipolar hip ball.    PROCEDURE IN DETAIL: The patient was met in the holding area and identified.  The appropriate hip  was marked at the operative site. The patient was then transported to the OR and  placed under anesthesia.  At that point, the patient was  placed in the lateral decubitus position with the operative side up and  secured to the operating room table and all bony prominences padded.     The operative lower extremity was prepped from the iliac crest to the toes.  Sterile draping was performed.  Time out was performed prior to incision.      A routine posterolateral approach was utilized via sharp dissection  carried down to the subcutaneous tissue.  Gross bleeders were Bovie  coagulated.  The iliotibial band was identified and incised  along the length of the skin incision.  Self-retaining retractors were  inserted.  With the hip internally rotated, the short external rotators  were identified. The piriformis was tagged with FiberWire, and the hip capsule released in a T-type fashion.  The femoral neck was exposed, and I resected the femoral  neck using the appropriate jig. This was performed at approximately a thumb's breadth above the lesser trochanter.    I then exposed the deep acetabulum, cleared out any tissue including the ligamentum teres, and included the hip capsule in the FiberWire used above and below the T.    I then prepared the proximal femur using the cookie-cutter, the lateralizing reamer, and then sequentially  broached.  A trial utilized, and I reduced the hip and it was found to have excellent stability with functional range of motion. The trial components were then removed.   I then place the real implant and I impacted the real head ball into place. The hip was then reduced and taken through functional range of motion and found to have excellent stability. Leg lengths were restored.  I then used a 2 mm drill bits to pass the FiberWire suture from the capsule and piriformis through the greater trochanter, and secured this. Excellent posterior capsular repair was achieved. I also closed the T in the capsule.  I then irrigated the hip copiously again with pulse lavage, and repaired the fascia with Vicryl, followed by Vicryl for the subcutaneous tissue, Monocryl for the skin, Steri-Strips and sterile gauze. The wounds were injected. The patient was then awakened and returned to PACU in stable and satisfactory condition. There were no complications.  Marchia Bond, MD Orthopedic Surgeon [phone]   02/18/2020 6:26 PM

## 2020-02-18 NOTE — Anesthesia Procedure Notes (Signed)
Date/Time: 02/18/2020 6:59 PM Performed by: Cynda Familia, CRNA Oxygen Delivery Method: Simple face mask Placement Confirmation: positive ETCO2 and breath sounds checked- equal and bilateral Dental Injury: Teeth and Oropharynx as per pre-operative assessment

## 2020-02-18 NOTE — Consult Note (Addendum)
[place] Nurse ostomy consult note Pt had colostomy surgery perfumed 01/06/20 and is familiar to the [facility] nursing team. Requested to assist with pouching supplies. Stoma type/location: Stoma is red and viable, 1 inch, flush with skin level Peristomal assessment: intact skin surrounding Output: mod amt semiformed brown stool Ostomy pouching: 1pc flexible convex Education provided:  Pt states his daughter assists with pouching activities prior to admission.  Applied barrier ring to attempt to maintain a seal and one piece flexible pouch.  Pt is wearing a belt.  Extra supplies left at the bedside for staff nurse use.  Please re-consult if further assistance is needed.  Thank-you,  Julien Girt MSN, [place], [place], [place], [place]

## 2020-02-18 NOTE — Progress Notes (Signed)
The patient has been re-examined, and the chart reviewed, and there have been no interval changes to the documented history and physical.    The risks benefits and alternatives were discussed with the patient including but not limited to the risks of nonoperative treatment, versus surgical intervention including infection, bleeding, nerve injury, malunion, nonunion, the need for revision surgery, hardware prominence, hardware failure, the need for hardware removal, blood clots, cardiopulmonary complications, morbidity, mortality, among others, and they were willing to proceed.    Displaced left femoral neck fracture with substantial comorbidities.  High mortality rate predicted within the next 3 to 6 months.  Nonetheless he needs surgical intervention to stabilize his femoral neck.  Plan for left hip hemiarthroplasty.  Marchia Bond, MD

## 2020-02-19 ENCOUNTER — Encounter: Payer: Self-pay | Admitting: *Deleted

## 2020-02-19 LAB — BASIC METABOLIC PANEL
Anion gap: 8 (ref 5–15)
BUN: 16 mg/dL (ref 8–23)
CO2: 22 mmol/L (ref 22–32)
Calcium: 8.1 mg/dL — ABNORMAL LOW (ref 8.9–10.3)
Chloride: 101 mmol/L (ref 98–111)
Creatinine, Ser: 0.74 mg/dL (ref 0.61–1.24)
GFR calc Af Amer: >60 mL/min (ref >60)
GFR calc non Af Amer: >60 mL/min (ref >60)
Glucose, Bld: 115 mg/dL — ABNORMAL HIGH (ref 70–99)
Potassium: 3.9 mmol/L (ref 3.5–5.1)
Sodium: 131 mmol/L — ABNORMAL LOW (ref 135–145)

## 2020-02-19 LAB — CBC
HCT: 32.7 % — ABNORMAL LOW (ref 39.0–52.0)
Hemoglobin: 10.7 g/dL — ABNORMAL LOW (ref 13.0–17.0)
MCH: 29.9 pg (ref 26.0–34.0)
MCHC: 32.7 g/dL (ref 30.0–36.0)
MCV: 91.3 fL (ref 80.0–100.0)
Platelets: 282 10*3/uL (ref 150–400)
RBC: 3.58 MIL/uL — ABNORMAL LOW (ref 4.22–5.81)
RDW: 15 % (ref 11.5–15.5)
WBC: 20.2 10*3/uL — ABNORMAL HIGH (ref 4.0–10.5)
nRBC: 0 % (ref 0.0–0.2)

## 2020-02-19 LAB — MAGNESIUM: Magnesium: 1.4 mg/dL — ABNORMAL LOW (ref 1.7–2.4)

## 2020-02-19 MED ORDER — MAGNESIUM SULFATE 2 GM/50ML IV SOLN
2.0000 g | Freq: Once | INTRAVENOUS | Status: AC
  Administered 2020-02-19: 2 g via INTRAVENOUS
  Filled 2020-02-19: qty 50

## 2020-02-19 MED ORDER — CHLORHEXIDINE GLUCONATE CLOTH 2 % EX PADS
6.0000 | MEDICATED_PAD | Freq: Every day | CUTANEOUS | Status: DC
  Administered 2020-02-19 – 2020-02-25 (×7): 6 via TOPICAL

## 2020-02-19 MED ORDER — PANTOPRAZOLE SODIUM 40 MG PO TBEC
40.0000 mg | DELAYED_RELEASE_TABLET | Freq: Every day | ORAL | Status: DC
  Administered 2020-02-19 – 2020-02-25 (×7): 40 mg via ORAL
  Filled 2020-02-19 (×7): qty 1

## 2020-02-19 NOTE — Progress Notes (Addendum)
Progress Note  Patient Name: Anthony Curry. Date of Encounter: 02/19/2020  Primary Cardiologist: Evalina Field, MD   Subjective   Patient underwent left hip hemiarthroplasty yesterday. Low grade fever this AM. Says he feels tired and has had intermittent cold sweats. Patient remains in sinus rhythm. No chest pain.  Inpatient Medications    Scheduled Meds: . acetaminophen  500 mg Oral Q6H  . acidophilus  1 capsule Oral Daily  . apixaban  5 mg Oral BID  . Chlorhexidine Gluconate Cloth  6 each Topical Daily  . demeclocycline  300 mg Oral BID  . diltiazem  180 mg Oral Q1200  . docusate sodium  100 mg Oral BID  . ferrous sulfate  325 mg Oral TID PC  . fluticasone  2 spray Each Nare QHS  . guaiFENesin  600 mg Oral BID  . ketorolac  7.5 mg Intravenous Q6H  . levothyroxine  50 mcg Oral QAC breakfast  . magnesium oxide  400 mg Oral Daily  . mirtazapine  30 mg Oral QHS  . multivitamin with minerals  1 tablet Oral Daily  . omega-3 acid ethyl esters  1 g Oral Daily  . potassium chloride SA  20 mEq Oral Q1200  . senna  1 tablet Oral BID  . tamsulosin  0.4 mg Oral BID  . thiamine  100 mg Oral Daily   Continuous Infusions: . magnesium sulfate bolus IVPB     PRN Meds: acetaminophen, ALPRAZolam, alum & mag hydroxide-simeth, benzonatate, bisacodyl, HYDROcodone-acetaminophen, HYDROcodone-acetaminophen, ipratropium-albuterol, magnesium citrate, menthol-cetylpyridinium **OR** phenol, morphine injection, ondansetron **OR** ondansetron (ZOFRAN) IV, polyethylene glycol   Vital Signs    Vitals:   02/18/20 2030 02/18/20 2035 02/19/20 0006 02/19/20 0418  BP:  138/60 (!) 121/57 (!) 121/58  Pulse:  (!) 109 (!) 103 (!) 110  Resp: (!) 21 20  20   Temp:  99.6 F (37.6 C) 98.4 F (36.9 C) (!) 100.4 F (38 C)  TempSrc:  Oral Oral Oral  SpO2:  90% 94% 96%  Weight:      Height:        Intake/Output Summary (Last 24 hours) at 02/19/2020 0806 Last data filed at 02/19/2020 0525 Gross per  24 hour  Intake 4411.67 ml  Output 1060 ml  Net 3351.67 ml   Last 3 Weights 02/17/2020 02/17/2020 02/06/2020  Weight (lbs) 126 lb 5.2 oz 130 lb 134 lb  Weight (kg) 57.3 kg 58.968 kg 60.782 kg      Telemetry    NSR, HR 90-110, PVCs and PACs, first degree AV block - Personally Reviewed  ECG    No new - Personally Reviewed  Physical Exam   GEN: No acute distress.   Neck: No JVD Cardiac: RRR, no murmurs, rubs, or gallops.  Respiratory: Clear to auscultation bilaterally. GI: Soft, nontender, non-distended  MS: No edema; No deformity. Neuro:  Nonfocal  Psych: Normal affect   Labs    High Sensitivity Troponin:  No results for input(s): TROPONINIHS in the last 720 hours.    Chemistry Recent Labs  Lab 02/17/20 1407 02/17/20 1407 02/18/20 0734 02/18/20 1856 02/19/20 0314  NA 132*   < > 131* 132* 131*  K 4.3   < > 3.7 4.0 3.9  CL 96*   < > 98 97* 101  CO2 27  --  25  --  22  GLUCOSE 96   < > 111* 111* 115*  BUN 18   < > 16 14 16   CREATININE 0.68   < >  0.65 0.70 0.74  CALCIUM 9.4  --  8.7*  --  8.1*  PROT 6.5  --   --   --   --   ALBUMIN 3.7  --   --   --   --   AST 27  --   --   --   --   ALT 24  --   --   --   --   ALKPHOS 123  --   --   --   --   BILITOT 0.6  --   --   --   --   GFRNONAA >60  --  >60  --  >60  GFRAA >60  --  >60  --  >60  ANIONGAP 9  --  8  --  8   < > = values in this interval not displayed.     Hematology Recent Labs  Lab 02/17/20 1407 02/17/20 1407 02/18/20 0734 02/18/20 1856 02/19/20 0314  WBC 8.3  --  11.3*  --  20.2*  RBC 4.78  --  4.18*  --  3.58*  HGB 14.2   < > 12.6* 9.9* 10.7*  HCT 42.9   < > 37.0* 29.0* 32.7*  MCV 89.7  --  88.5  --  91.3  MCH 29.7  --  30.1  --  29.9  MCHC 33.1  --  34.1  --  32.7  RDW 14.6  --  14.8  --  15.0  PLT 428*  --  379  --  282   < > = values in this interval not displayed.    BNPNo results for input(s): BNP, PROBNP in the last 168 hours.   DDimer No results for input(s): DDIMER in the last  168 hours.   Radiology    DG Chest 1 View  Result Date: 02/17/2020 CLINICAL DATA:  Cough for the past week. EXAM: CHEST  1 VIEW COMPARISON:  CT chest dated January 23, 2020. Chest x-ray dated January 05, 2020. FINDINGS: The heart size and mediastinal contours are within normal limits. Atherosclerotic calcification of the aortic arch. Normal pulmonary vascularity. The lungs are hyperinflated with emphysematous changes. No focal consolidation, pleural effusion, or pneumothorax. No acute osseous abnormality. Old bilateral rib fractures and right proximal humerus fracture again noted. Prior left total shoulder arthroplasty. IMPRESSION: 1. No active disease. 2. COPD. Electronically Signed   By: Titus Dubin M.D.   On: 02/17/2020 13:39   DG Hip Port Unilat With Pelvis 1V Left  Result Date: 02/18/2020 CLINICAL DATA:  Status post total left hip replacement. EXAM: DG HIP (WITH OR WITHOUT PELVIS) 1V PORT LEFT COMPARISON:  February 17, 2020 FINDINGS: There is no evidence of an acute hip fracture or dislocation. A stable 1.8 cm x 0.8 cm sclerotic focus is seen just below the left acetabulum. A left hip replacement is seen without evidence of surrounding lucency to suggest the presence of hardware loosening or infection. Marked severity degenerative changes seen within the visualized portion of the lower lumbar spine. Soft tissue structures are unremarkable. IMPRESSION: 1. Status post left hip replacement without evidence of hardware loosening or infection. Electronically Signed   By: Virgina Norfolk M.D.   On: 02/18/2020 19:48   DG Hip Unilat With Pelvis 2-3 Views Left  Result Date: 02/17/2020 CLINICAL DATA:  Fall, hip pain EXAM: DG HIP (WITH OR WITHOUT PELVIS) 2-3V LEFT COMPARISON:  None. FINDINGS: Fracture of the LEFT femoral neck with varus angulation. No dislocation. Sclerotic lesion in the  LEFT inferior pubic ramus measuring 2 cm appears benign on comparison CT 01/05/2020 IMPRESSION: LEFT femoral neck  fracture. Electronically Signed   By: Suzy Bouchard M.D.   On: 02/17/2020 13:37    Cardiac Studies   Echo limited 01/08/20 1. Left ventricular ejection fraction, by visual estimation, is 55 to  60%.  2. Moderate to large pericardial effusion. No significant change since  the last exam 01/02/20 on side by side comparison. No RV diastolic  collapse. Suboptimal Doppler alignment of mitral inflow, no definite  respiratory variability. Hypertensive and  normal heart rate. These findings suggest no evidence of tamponade  physiology. Hepatic vein Doppler not performed. IVC not visualized.  Borderline respiratory related leftward ventricular septal shift.   Echo 01/02/20 1. Left ventricular ejection fraction, by visual estimation, is 60 to  65%. The left ventricle has normal function. There is no left ventricular  hypertrophy.  2. Left ventricular diastolic parameters are consistent with Grade I  diastolic dysfunction (impaired relaxation).  3. Global right ventricle has normal systolic function.The right  ventricular size is normal. No increase in right ventricular wall  thickness.  4. Left atrial size was normal.  5. Right atrial size was normal.  6. Large pericardial effusion.  7. The pericardial effusion is anterior to the right ventricle.  8. There is inversion of the right ventricular wall and excessive  respiratory variation in the tricuspid valve spectral Doppler velocities.  9. The mitral valve is normal in structure. Mild mitral valve  regurgitation. No evidence of mitral stenosis.  10. The tricuspid valve is normal in structure.  11. The tricuspid valve is normal in structure. Tricuspid valve  regurgitation is mild.  12. The aortic valve is normal in structure. Aortic valve regurgitation is  not visualized. No evidence of aortic valve sclerosis or stenosis.  13. The pulmonic valve was normal in structure. Pulmonic valve  regurgitation is not visualized.  14.  Mildly elevated pulmonary artery systolic pressure.  15. The inferior vena cava is normal in size with greater than 50%  respiratory variability, suggesting right atrial pressure of 3 mmHg.   Patient Profile     79 y.o. male  with a hx of stage IV lung CA (chemo, radiation, immunotherapy), SIADH, treated adrenal mass, paroxysmal Afib on Eliqus, remote alcoholism (quit 10 years ago) and tobacco use (quit 25 years ago), anemia of chronic disease, BPH, GERD, HTN, pulmonary nodules, arthritis, unsteadiness/falls, chronic pericardial effusion, and recent hospitalization for perforated diverticulitis s/p colostomy which was complicated by covid PNA and ischemic left hand who is being seen today for pre-op evaluation for left hemiarthroplasty.   Assessment & Plan    Pre-op evaluation  Hip hemiarthroplasty with Dr. Mardelle Matte yesterday. Eliquis was held in anticipation of surgery started on IV heparin - Eliquis restarted today - Cardiac history includes long history of paroxysmal Afib and chronic pericardial effusion - Afib is longstanding dx on diltiazem and on chronic anticoagulation. Remains in NSR with PACs and PVCs - Pericardial effusion recently evaluated by cards last admission and felt to be chronic given h/o of cancer. So far no signs of tamponade.  - Patient has no history of MI or stent. He denies chest pain on exertion. Does have sob which is expected after recent Covid PNA and deconditioning. Most recent echo showed preserved EF with no wall motion abnormalities. - Patient has not been functional since his discharge and has needed assistance with ADLs. Would continue with PT/home health  Pericardial Effusion - evaluated by  Dr. Audie Box during last admission and felt this was chronic given his cancer h/o and continuation of anticoagulation was recommended - Follow-up limited echo 01/08/20 showed showed moderate pericardial effusion with no significant change, no evidence if tamponade.  - Plan to  follow with serial echos  Paroxysmal Afib s/p DCCV in 2015 - patient was on IV heparin for procedure>>Eliquis restarted today - Patient remains in  NSR.  - Cardizem 180mg  daily for rate control>>continued on admission - CHADSVASC = 4 (agex2, HTN, PAD) This patients CHA2DS2-VASc Score and unadjusted Ischemic Stroke Rate (% per year) is equal to 4.8 % stroke rate/year from a score of 4  Above score calculated as 1 point each if present [CHF, HTN, DM, Vascular=MI/PAD/Aortic Plaque, Age if 65-74, or Male] Above score calculated as 2 points each if present [Age > 75, or Stroke/TIA/TE]    For questions or updates, please contact [facility] Please consult [email or web address] for contact info under        Signed, Cadence Ninfa Meeker, PA-C  02/19/2020, 8:06 AM    As above, patient seen and examined.  Patient denies chest pain or dyspnea.  He remains in sinus rhythm this morning.  Would continue Cardizem at present dose as well as apixaban.  As outlined previously pericardial effusion is chronic and appears to be moderate on most recent study.  We will arrange follow-up with Dr. Audie Box for cardiac issues once he recovers from his present illness.  We will sign off.  Please call with questions.  Kirk Ruths, MD

## 2020-02-19 NOTE — Progress Notes (Signed)
Physical Therapy Evaluation Patient Details Name: Anthony Curry. MRN: [id number] DOB: 1941-04-28 Today's Date: 02/19/2020      02/19/20 1300  PT Visit Information  Last PT Received On 02/19/20  Assistance Needed +2  History of Present Illness Patient is 79 y.o. male s/p fall at home on 02/17/20 while transfering with RW. Pt found to have displaced left femoral neck fracture and now s/p Lt THA on 02/18/20.  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  Precautions  Precautions Posterior Hip  Precaution Booklet Issued Yes (comment)  Restrictions  Weight Bearing Restrictions No  Home Living  Family/patient expects to be discharged to: Unsure  Living Arrangements Spouse/significant other  Available Help at Discharge Family;Available 24 hours/day (children)  Type of Home Other(Comment)  Home Access Elevator  Home Layout One level  Bathroom Shower/Tub Walk-in Market researcher seat;Walker - 4 wheels;Cane - single point;Grab bars - toilet;Grab bars - tub/shower;Hand held shower head  Prior Function  Level of Independence Independent with assistive device(s);Needs assistance  Gait / Transfers Assistance Needed using rollator around home to mobilize. per pt/wife he ambulate down condo hallway for exercise.   ADL's / Homemaking Assistance Needed daughter is assisting with bathing. family also assist with ostomy change.  Comments pt and his wife report pt is independent with rollator initially. Then reveal he  requires assist from daughter to bath/shower but do not indiacte what other areas he requires assistance with.  Communication  Communication No difficulties  Pain Assessment  Pain Assessment Faces  Faces Pain Scale 6  Pain Location Lt hip  Pain Descriptors / Indicators Grimacing;Guarding;Discomfort  Pain Intervention(s) Limited activity within patient's tolerance;Monitored during session;Repositioned;Ice applied  Cognition  Arousal/Alertness Awake/alert  Behavior During Therapy WFL for tasks assessed/performed  Overall Cognitive Status Within Functional Limits for tasks assessed  Upper Extremity Assessment  Upper Extremity Assessment Overall WFL for tasks assessed  Lower Extremity Assessment  Lower Extremity Assessment Generalized weakness  Cervical / Trunk Assessment  Cervical / Trunk Assessment Kyphotic  Bed Mobility  Overal bed mobility Needs Assistance  Bed Mobility Supine to Sit  Supine to sit HOB elevated;+2 for physical assistance;Mod assist  General bed mobility comments cues for maintaining posterior hip precautions, assist for Lt LE mobility and to raise trunk. Pt requried cues sitting EOB to deter forward trunk lean and repeated reminders for posterior hip precautions.  Transfers  Overall transfer level Needs assistance  Equipment used Rolling walker (2 wheeled)  Transfers Sit to/from [employer] Transfers  Sit to Stand Mod assist;+2 physical assistance;+2 safety/equipment;From elevated surface  Stand pivot transfers Mod assist;+2 physical assistance;+2 safety/equipment  General transfer comment pt require repeated cues for safety and to maintain posterior hip precautions during mobility. Pt very unsteady with rising to stand and unable to acheive full upright posture with walker. mod assist required to manage walker and cue fo step pattern to pivot to bedside recliner.  Balance  Overall balance assessment History of Falls;Needs assistance  Sitting-balance support  Feet supported  Sitting balance-Leahy Scale Fair  Standing balance support During functional activity;Bilateral upper extremity supported  Standing  balance-Leahy Scale Poor  Standing balance comment pt heavily reliant on external support  Exercises  Exercises Total Joint  Total Joint Exercises  Ankle Circles/Pumps AROM;10 reps;Both;Seated  PT - End of Session  Equipment Utilized During Treatment Gait belt  Activity Tolerance Patient tolerated treatment well  Patient left in chair;with call bell/phone within reach;with chair alarm set;with family/visitor present  Nurse Communication Mobility status  PT Assessment  PT Recommendation/Assessment Patient needs continued PT services  PT Visit Diagnosis Muscle weakness (generalized) (M62.81);Difficulty in walking, not elsewhere classified (R26.2)  PT Problem List Decreased strength;Decreased range of motion;Decreased activity tolerance;Decreased balance;Decreased mobility;Decreased knowledge of use of DME;Decreased knowledge of precautions;Pain  Barriers to Discharge Decreased caregiver support  PT Plan  PT Frequency (ACUTE ONLY) 7X/week  PT Treatment/Interventions (ACUTE ONLY) DME instruction;Gait training;Stair training;Functional mobility training;Therapeutic activities;Therapeutic exercise;Balance training;Patient/family education  AM-PAC PT "6 Clicks" Mobility Outcome Measure (Version 2)  Help needed turning from your back to your side while in a flat bed without using bedrails? 2  Help needed moving from lying on your back to sitting on the side of a flat bed without using bedrails? 2  Help needed moving to and from a bed to a chair (including a wheelchair)? 2  Help needed standing up from a chair using your arms (e.g., wheelchair or bedside chair)? 2  Help needed to walk in hospital room? 2  Help needed climbing 3-5 steps with a railing?  2  6 Click Score 12  Consider Recommendation of Discharge To: CIR/SNF/LTACH  PT Recommendation   Follow Up Recommendations SNF;Supervision/Assistance - 24 hour  PT equipment None recommended by PT  Individuals Consulted  Consulted and Agree with Results and Recommendations Patient  Acute Rehab PT Goals  Patient Stated Goal pt and wife expressing desire to return home  PT Goal Formulation With patient  Time For Goal Achievement 03/04/20  Potential to Achieve Goals Fair  PT Time Calculation  PT Start Time (ACUTE ONLY) 1332  PT Stop Time (ACUTE ONLY) 1401  PT Time Calculation (min) (ACUTE ONLY) 29 min  PT General Charges  $$ ACUTE PT VISIT 1 Visit  PT Evaluation  $PT Eval Low Complexity 1 Low  PT Treatments  $Therapeutic Activity 8-22 mins  Written Expression  Dominant Hand Right     Verner Mould, DPT Physical Therapist with [facility] [phone]  02/19/2020 6:14 PM

## 2020-02-19 NOTE — Progress Notes (Signed)
Order given to discontinue Foley catheter 6 am pod 1.Foley was present on admission. Patient said he already have f/u  with [facility] on April first regarding filling his bladder taking foley cathter out and voiding trails.

## 2020-02-19 NOTE — Progress Notes (Signed)
PROGRESS NOTE  Anthony Curry. GNF:[id number] DOB: 1941/02/01 DOA: 02/17/2020 PCP: [name], MD   LOS: 2 days   Brief narrative: As per HPI,  Anthony Curry. is a 79 y.o. 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 fib, SIADH, hypothyroidism, depression, anxiety who was hospitalized from 1/28-01/10/20 for perforated diverticulitis and underwent sigmoid colectomy with colostomy creation,  hospital course complicated by Covid 19 pneumonia, urinary retention requiring indwelling foley, ischemic left hand requiring left brachial embolectomy on 2/1 by vascular surgery ( Dr Oneida Alar), on Eliquis since then, presented  to ED with mechanical fall /left hip pain. Patient had complicated hospital course and was discharged to SNF at last discharge but states was not getting enough help there, felt miserable and couldn't stay there long. Left home the following day. He lost 15-20 pounds weight since last admission. ED Course: Afebrile, pulse 78, respiratory rate 18, blood pressure 146/84--171/69, O2 sat 99% on room air.  WBC 8.3, hemoglobin 14.2 hematocrit 42.9, platelet 428, sodium 132, potassium 4.3, chloride 96, BUN 18, creatinine 0.68, calcium 9.4, INR 1.1, glucose 96. Hip x-ray showed left femoral neck angulated fracture, CXR with COPD changes, no acute infiltrates.  Orthopedics (Dr. Mardelle Matte) consulted, who recommended to hold Eliquis and plan for surgery..  Assessment/Plan:  Principal Problem:   Left displaced femoral neck fracture (HCC) Active Problems:   Hyponatremia   BPH (benign prostatic hyperplasia)   GERD (gastroesophageal reflux disease)   Fall   Physical deconditioning   Hypoalbuminemia   Malignant neoplasm of lower lobe of right lung (HCC)   Atrial fibrillation (HCC)   Hypertension   SIADH (syndrome of inappropriate ADH production) ([place])   Diverticulitis   Pericardial effusion   Thromboembolism (HCC)   Hip  fracture (HCC)   Left Femoral neck fracture: Status post left hip hemiarthroplasty by orthopedics on 02/18/2020.  PT has been consulted.  Continue analgesia.  Continue supportive care.  Mild grade fever.  Postoperative fever.  Incentive spirometry.  Will closely monitor.  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  Continue ostomy care.  Stage IV non-small cell lung cancer:Patient follows Dr.  Earlie Server and receives immunotherapy as outpatient.   Continue mucolytics and bronchodilators.  Chest x-ray without acute pneumonia.  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  Patient was previously evaluated by cardiology with impression of moderate pericardial effusion likely due to lung CA and recommended continuation of anticoagulation.  Patient had repeat echocardiogram on 2/3-with no tamponade physiology. Patient has been seen by cardiology this time.  Patient will have to follow-up with cardiology as outpatient after discharge.  Paroxysmal atrial fibrillation: Currently on Eliquis and Cardizem.  Cardiology recommended to continue the same..   Sinus rhythm at this time.  Urinary retention:Had Foley catheter placed in last admission and had failed voiding trial on 2/4- Continue Flomax, finasteride.  He was advised to follow-up urology as outpatient  SIADH/chronic hyponatremia:Likely secondary to lung cancer.  On demeclocycline.  Will continue.  Sodium of 131.  Hypothyroidism: Continue Synthroid  Hypomagnesemia.  Will continue to replenish orally.  We will give 1 dose of IV magnesium sulfate today.  History of COPD/ Emphysema/recent COVID-19 pneumonia: Chest x-ray unremarkable at this time.  Continue inhalers.  GERD: Continue PPI  Generalized Deconditioning/debility with protein calorie malnutrition:Patient was discharged to skilled nursing facility in the last admission but decided to go home after that.  Continue  nutritional supplements.  Will get physical therapy evaluation.  VTE Prophylaxis: Heparin subcu  Code Status: Full code  Family Communication: I tried to reach the patient's daughter Ms. [name] and his spouse Ms. [name] on the phone provided but was unable to reach them today.  Disposition Plan:  . Patient is from home . Likely disposition to skilled nursing facility/PT pending.  If refusing skilled nursing facility, will likely need home with home health. . Barriers to discharge: Status post hip surgery.  Pending PT evaluation.  Consultants:  Orthopedics  Cardiology  Procedures:  Left hip hemiarthroplasty on 02/18/2020  Antibiotics:  . Demeclocycline  Anti-infectives (From admission, onward)   Start     Dose/Rate Route Frequency Ordered Stop   02/18/20 2300  ceFAZolin (ANCEF) IVPB 2g/100 mL premix     2 g 200 mL/hr over 30 Minutes Intravenous Every 6 hours 02/18/20 2036 02/19/20 0446   02/18/20 1300  ceFAZolin (ANCEF) IVPB 2g/100 mL premix     2 g 200 mL/hr over 30 Minutes Intravenous On call to O.R. 02/17/20 2239 02/18/20 1706   02/17/20 2200  demeclocycline (DECLOMYCIN) tablet 300 mg    Note to Pharmacy: Take 2 tablets twice a day     300 mg Oral 2 times daily 02/17/20 1544        Subjective: Patient denies any shortness of breath, cough, or overt pain.  Eating okay.  He had some sweating overnight.  T-max of 100.5 degrees noted.  Objective: Vitals:   02/19/20 0418 02/19/20 1013  BP: (!) 121/58 (!) 98/46  Pulse: (!) 110 90  Resp: 20 18  Temp: (!) 100.4 F (38 C) 98.2 F (36.8 C)  SpO2: 96% 99%    Intake/Output Summary (Last 24 hours) at 02/19/2020 1113 Last data filed at 02/19/2020 0525 Gross per 24 hour  Intake 4411.67 ml  Output 760 ml  Net 3651.67 ml   Filed Weights   02/17/20 1341 02/17/20 2048  Weight: 59 kg 57.3 kg   Body mass index is 17.62 kg/m.   Physical Exam: GENERAL: Patient is alert awake and oriented. Not in obvious distress.  Thinly  built, HENT: No scleral pallor or icterus. Pupils equally reactive to light. Oral mucosa is moist NECK: is supple, no gross swelling noted. CHEST: Clear to auscultation. No crackles or wheezes.  Diminished breath sounds bilaterally. CVS: S1 and S2 heard, no murmur. Regular rate and rhythm.  ABDOMEN: Soft, non-tender, bowel sounds are present.  Foley catheter in place.  Colostomy bag in place. EXTREMITIES: Status post left hip hemiarthroplasty..  Distal pulses palpable. CNS: Cranial nerves are intact. No focal motor deficits. SKIN: warm and dry without rashes.  Data Review: I have personally reviewed the following laboratory data and studies,  CBC: Recent Labs  Lab 02/17/20 1407 02/18/20 0734 02/18/20 1856 02/19/20 0314  WBC 8.3 11.3*  --  20.2*  NEUTROABS 6.1  --   --   --   HGB 14.2 12.6* 9.9* 10.7*  HCT 42.9 37.0* 29.0* 32.7*  MCV 89.7 88.5  --  91.3  PLT 428* 379  --  833   Basic Metabolic Panel: Recent Labs  Lab 02/17/20 1407 02/18/20 0734 02/18/20 1856 02/19/20 0314  NA 132* 131* 132* 131*  K 4.3 3.7 4.0 3.9  CL 96* 98 97* 101  CO2 27 25  --  22  GLUCOSE 96 111* 111* 115*  BUN 18 16 14 16   CREATININE 0.68 0.65 0.70 0.74  CALCIUM 9.4 8.7*  --  8.1*  MG  --   --   --  1.4*   Liver Function Tests: Recent Labs  Lab 02/17/20 1407  AST 27  ALT 24  ALKPHOS 123  BILITOT 0.6  PROT 6.5  ALBUMIN 3.7   No results for input(s): LIPASE, AMYLASE in the last 168 hours. No results for input(s): AMMONIA in the last 168 hours. Cardiac Enzymes: No results for input(s): CKTOTAL, CKMB, CKMBINDEX, TROPONINI in the last 168 hours. BNP (last 3 results) Recent Labs    06/28/19 0646 01/05/20 1119  BNP 67.4 109.6*    ProBNP (last 3 results) No results for input(s): PROBNP in the last 8760 hours.  CBG: No results for input(s): GLUCAP in the last 168 hours. Recent Results (from the past 240 hour(s))  Surgical pcr screen     Status: Abnormal   Collection Time: 02/17/20  10:52 PM   Specimen: Nasal Mucosa; Nasal Swab  Result Value Ref Range Status   MRSA, PCR NEGATIVE NEGATIVE Final   Staphylococcus aureus POSITIVE (A) NEGATIVE Final    Comment: (NOTE) The Xpert SA Assay (FDA approved for NASAL specimens in patients 38 years of age and older), is one component of a comprehensive surveillance program. It is not intended to diagnose infection nor to guide or monitor treatment. Performed at [facility], [place] [address] [place], [place] [address]      Studies: DG Chest 1 View  Result Date: 02/17/2020 CLINICAL DATA:  Cough for the past week. EXAM: CHEST  1 VIEW COMPARISON:  CT chest dated January 23, 2020. Chest x-ray dated January 05, 2020. FINDINGS: The heart size and mediastinal contours are within normal limits. Atherosclerotic calcification of the aortic arch. Normal pulmonary vascularity. The lungs are hyperinflated with emphysematous changes. No focal consolidation, pleural effusion, or pneumothorax. No acute osseous abnormality. Old bilateral rib fractures and right proximal humerus fracture again noted. Prior left total shoulder arthroplasty. IMPRESSION: 1. No active disease. 2. COPD. Electronically Signed   By: Titus Dubin M.D.   On: 02/17/2020 13:39   DG Hip Port Unilat With Pelvis 1V Left  Result Date: 02/18/2020 CLINICAL DATA:  Status post total left hip replacement. EXAM: DG HIP (WITH OR WITHOUT PELVIS) 1V PORT LEFT COMPARISON:  February 17, 2020 FINDINGS: There is no evidence of an acute hip fracture or dislocation. A stable 1.8 cm x 0.8 cm sclerotic focus is seen just below the left acetabulum. A left hip replacement is seen without evidence of surrounding lucency to suggest the presence of hardware loosening or infection. Marked severity degenerative changes seen within the visualized portion of the lower lumbar spine. Soft tissue structures are unremarkable. IMPRESSION: 1. Status post left hip replacement without evidence of  hardware loosening or infection. Electronically Signed   By: Virgina Norfolk M.D.   On: 02/18/2020 19:48   DG Hip Unilat With Pelvis 2-3 Views Left  Result Date: 02/17/2020 CLINICAL DATA:  Fall, hip pain EXAM: DG HIP (WITH OR WITHOUT PELVIS) 2-3V LEFT COMPARISON:  None. FINDINGS: Fracture of the LEFT femoral neck with varus angulation. No dislocation. Sclerotic lesion in the LEFT inferior pubic ramus measuring 2 cm appears benign on comparison CT 01/05/2020 IMPRESSION: LEFT femoral neck fracture. Electronically Signed   By: Suzy Bouchard M.D.   On: 02/17/2020 13:37      Flora Lipps, MD  [facility] 02/19/2020

## 2020-02-19 NOTE — Progress Notes (Signed)
Subjective: 1 Day Post-Op s/p Procedure(s): LEFT HIP HEMIARTHROPLASTY   Patient is alert, oriented, laying in bed. Reports hip pain to be mild this morning, pain generally well controlled. States he is feeling overall "down". Denies nausea or vomiting.    Objective:  PE: VITALS:   Vitals:   02/18/20 2030 02/18/20 2035 02/19/20 0006 02/19/20 0418  BP:  138/60 (!) 121/57 (!) 121/58  Pulse:  (!) 109 (!) 103 (!) 110  Resp: (!) 21 20  20   Temp:  99.6 F (37.6 C) 98.4 F (36.9 C) (!) 100.4 F (38 C)  TempSrc:  Oral Oral Oral  SpO2:  90% 94% 96%  Weight:      Height:        ABD soft Neurovascular intact Sensation intact distally Intact pulses distally Dorsiflexion/Plantar flexion intact Incision: dressing C/D/I  LABS  Results for orders placed or performed during the hospital encounter of 02/17/20 (from the past 24 hour(s))  Heparin level (unfractionated)     Status: Abnormal   Collection Time: 02/18/20 10:13 AM  Result Value Ref Range   Heparin Unfractionated 1.12 (H) 0.30 - 0.70 IU/mL  APTT     Status: Abnormal   Collection Time: 02/18/20 10:13 AM  Result Value Ref Range   aPTT 82 (H) 24 - 36 seconds  I-STAT, chem 8     Status: Abnormal   Collection Time: 02/18/20  6:56 PM  Result Value Ref Range   Sodium 132 (L) 135 - 145 mmol/L   Potassium 4.0 3.5 - 5.1 mmol/L   Chloride 97 (L) 98 - 111 mmol/L   BUN 14 8 - 23 mg/dL   Creatinine, Ser 0.70 0.61 - 1.24 mg/dL   Glucose, Bld 111 (H) 70 - 99 mg/dL   Calcium, Ion 1.19 1.15 - 1.40 mmol/L   TCO2 24 22 - 32 mmol/L   Hemoglobin 9.9 (L) 13.0 - 17.0 g/dL   HCT 29.0 (L) 39.0 - 52.0 %  CBC     Status: Abnormal   Collection Time: 02/19/20  3:14 AM  Result Value Ref Range   WBC 20.2 (H) 4.0 - 10.5 K/uL   RBC 3.58 (L) 4.22 - 5.81 MIL/uL   Hemoglobin 10.7 (L) 13.0 - 17.0 g/dL   HCT 32.7 (L) 39.0 - 52.0 %   MCV 91.3 80.0 - 100.0 fL   MCH 29.9 26.0 - 34.0 pg   MCHC 32.7 30.0 - 36.0 g/dL   RDW 15.0 11.5 - 15.5 %   Platelets 282 150 - 400 K/uL   nRBC 0.0 0.0 - 0.2 %  Basic metabolic panel     Status: Abnormal   Collection Time: 02/19/20  3:14 AM  Result Value Ref Range   Sodium 131 (L) 135 - 145 mmol/L   Potassium 3.9 3.5 - 5.1 mmol/L   Chloride 101 98 - 111 mmol/L   CO2 22 22 - 32 mmol/L   Glucose, Bld 115 (H) 70 - 99 mg/dL   BUN 16 8 - 23 mg/dL   Creatinine, Ser 0.74 0.61 - 1.24 mg/dL   Calcium 8.1 (L) 8.9 - 10.3 mg/dL   GFR calc non Af Amer >60 >60 mL/min   GFR calc Af Amer >60 >60 mL/min   Anion gap 8 5 - 15  Magnesium     Status: Abnormal   Collection Time: 02/19/20  3:14 AM  Result Value Ref Range   Magnesium 1.4 (L) 1.7 - 2.4 mg/dL   *Note: Due to a large number  of results and/or encounters for the requested time period, some results have not been displayed. A complete set of results can be found in Results Review.    DG Chest 1 View  Result Date: 02/17/2020 CLINICAL DATA:  Cough for the past week. EXAM: CHEST  1 VIEW COMPARISON:  CT chest dated January 23, 2020. Chest x-ray dated January 05, 2020. FINDINGS: The heart size and mediastinal contours are within normal limits. Atherosclerotic calcification of the aortic arch. Normal pulmonary vascularity. The lungs are hyperinflated with emphysematous changes. No focal consolidation, pleural effusion, or pneumothorax. No acute osseous abnormality. Old bilateral rib fractures and right proximal humerus fracture again noted. Prior left total shoulder arthroplasty. IMPRESSION: 1. No active disease. 2. COPD. Electronically Signed   By: Titus Dubin M.D.   On: 02/17/2020 13:39   DG Hip Port Unilat With Pelvis 1V Left  Result Date: 02/18/2020 CLINICAL DATA:  Status post total left hip replacement. EXAM: DG HIP (WITH OR WITHOUT PELVIS) 1V PORT LEFT COMPARISON:  February 17, 2020 FINDINGS: There is no evidence of an acute hip fracture or dislocation. A stable 1.8 cm x 0.8 cm sclerotic focus is seen just below the left acetabulum. A left hip  replacement is seen without evidence of surrounding lucency to suggest the presence of hardware loosening or infection. Marked severity degenerative changes seen within the visualized portion of the lower lumbar spine. Soft tissue structures are unremarkable. IMPRESSION: 1. Status post left hip replacement without evidence of hardware loosening or infection. Electronically Signed   By: Virgina Norfolk M.D.   On: 02/18/2020 19:48   DG Hip Unilat With Pelvis 2-3 Views Left  Result Date: 02/17/2020 CLINICAL DATA:  Fall, hip pain EXAM: DG HIP (WITH OR WITHOUT PELVIS) 2-3V LEFT COMPARISON:  None. FINDINGS: Fracture of the LEFT femoral neck with varus angulation. No dislocation. Sclerotic lesion in the LEFT inferior pubic ramus measuring 2 cm appears benign on comparison CT 01/05/2020 IMPRESSION: LEFT femoral neck fracture. Electronically Signed   By: Suzy Bouchard M.D.   On: 02/17/2020 13:37    Assessment/Plan: Principal Problem:   Left displaced femoral neck fracture (HCC) Active Problems:   Hyponatremia   BPH (benign prostatic hyperplasia)   GERD (gastroesophageal reflux disease)   Fall   Physical deconditioning   Hypoalbuminemia   Malignant neoplasm of lower lobe of right lung (HCC)   Atrial fibrillation (HCC)   Hypertension   SIADH (syndrome of inappropriate ADH production) ([place])   Diverticulitis   Pericardial effusion   Thromboembolism (HCC)   Hip fracture (HCC)    1 Day Post-Op s/p Procedure(s): LEFT HIP HEMIARTHROPLASTY  Weightbearing: WBAT LLE, up with therapy. Noted patient has issues with dizziness and neuropathy so will see how he progresses with therapy.  Insicional and dressing care: PRN dressing changes VTE prophylaxis: Eliquis will restart this afternoon Pain control: continue current pain control regimen Follow - up plan: Follow-up with Dr. Mardelle Matte 2 weeks after discharge Dispo: Daughter states she, brother, and patient's wife, are not currently equipped to care for  patient full time at home, however they had a difficult experience at [facility] after last discharge. Will likely need to pursue other SNF options if family and patient are amenable.   Contact information:   Weekdays 8-5 [address], [place] [phone] A fter hours and holidays please check [email or web address] for group call information for [facility]  Ventura Bruns 02/19/2020, 9:32 AM

## 2020-02-20 ENCOUNTER — Telehealth: Payer: Self-pay | Admitting: Medical Oncology

## 2020-02-20 ENCOUNTER — Inpatient Hospital Stay (HOSPITAL_COMMUNITY): Payer: Medicare Other

## 2020-02-20 LAB — MAGNESIUM: Magnesium: 1.8 mg/dL (ref 1.7–2.4)

## 2020-02-20 LAB — BASIC METABOLIC PANEL
Anion gap: 5 (ref 5–15)
BUN: 22 mg/dL (ref 8–23)
CO2: 25 mmol/L (ref 22–32)
Calcium: 8.3 mg/dL — ABNORMAL LOW (ref 8.9–10.3)
Chloride: 102 mmol/L (ref 98–111)
Creatinine, Ser: 0.5 mg/dL — ABNORMAL LOW (ref 0.61–1.24)
GFR calc Af Amer: >60 mL/min (ref >60)
GFR calc non Af Amer: >60 mL/min (ref >60)
Glucose, Bld: 152 mg/dL — ABNORMAL HIGH (ref 70–99)
Potassium: 4.1 mmol/L (ref 3.5–5.1)
Sodium: 132 mmol/L — ABNORMAL LOW (ref 135–145)

## 2020-02-20 LAB — PROCALCITONIN: Procalcitonin: 1.88 ng/mL

## 2020-02-20 LAB — CBC
HCT: 31.4 % — ABNORMAL LOW (ref 39.0–52.0)
Hemoglobin: 10.6 g/dL — ABNORMAL LOW (ref 13.0–17.0)
MCH: 30.1 pg (ref 26.0–34.0)
MCHC: 33.8 g/dL (ref 30.0–36.0)
MCV: 89.2 fL (ref 80.0–100.0)
Platelets: 266 10*3/uL (ref 150–400)
RBC: 3.52 MIL/uL — ABNORMAL LOW (ref 4.22–5.81)
RDW: 14.8 % (ref 11.5–15.5)
WBC: 17.1 10*3/uL — ABNORMAL HIGH (ref 4.0–10.5)
nRBC: 0 % (ref 0.0–0.2)

## 2020-02-20 MED ORDER — FENTANYL CITRATE (PF) 100 MCG/2ML IJ SOLN
25.0000 ug | Freq: Once | INTRAMUSCULAR | Status: AC
  Administered 2020-02-20: 25 ug via INTRAVENOUS
  Filled 2020-02-20: qty 2

## 2020-02-20 MED ORDER — DIGOXIN 0.25 MG/ML IJ SOLN: 0.2500 mg | Freq: Every day | INTRAMUSCULAR | Status: DC

## 2020-02-20 MED ORDER — DIGOXIN 0.25 MG/ML IJ SOLN
0.2500 mg | Freq: Every day | INTRAMUSCULAR | Status: AC
  Administered 2020-02-20: 0.25 mg via INTRAVENOUS
  Filled 2020-02-20: qty 1

## 2020-02-20 MED ORDER — SODIUM CHLORIDE 0.9 % IV BOLUS: 500.0000 mL | Freq: Once | INTRAVENOUS | Status: DC | PRN

## 2020-02-20 MED ORDER — ALUM & MAG HYDROXIDE-SIMETH 200-200-20 MG/5ML PO SUSP
30.0000 mL | Freq: Once | ORAL | Status: DC
  Filled 2020-02-20: qty 30

## 2020-02-20 NOTE — Plan of Care (Signed)

## 2020-02-20 NOTE — TOC Progression Note (Signed)
Transition of Care (TOC) - Progression Note    Patient Details  Name: Anthony Curry. MRN: [id number] Date of Birth: 1941-08-17  Transition of Care [facility]) CM/SW Contact  Purcell Mouton, RN Phone Number: 02/20/2020, 2:48 PM  Clinical Narrative:     Spoke with pt, wife at bedside and pt's daughter [name] via cell phone concerning discharge plans. [name] would like for pt to go to a good Rehab facility and is very concern about Rehab. [name] states that they tried [facility] but that is only 2x a week vs Rehab x5 a week, which is true. This pt will need another COVID test for SNF related to the last one being in Jan 2021.        Expected Discharge Plan and Services                                                 Social Determinants of Health (SDOH) Interventions    Readmission Risk Interventions Readmission Risk Prevention Plan 01/06/2020  Transportation Screening Complete  Medication Review Press photographer) Complete  Some recent data might be hidden

## 2020-02-20 NOTE — Plan of Care (Signed)
Pt asks pertinent questions regarding his care. Pt is more comfortable tonight than last night. Last nigh he rated his pain 10/10. Tonight he rates his pain at a 6/10.

## 2020-02-20 NOTE — Care Management Important Message (Signed)
Important Message  Patient Details IM Letter given to [name]  RN Case Manager to present to the Patient Name: Anthony Curry. MRN: [id number] Date of Birth: 06-22-1941   Medicare Important Message Given:  Yes     Kerin Salen 02/20/2020, 10:04 AM

## 2020-02-20 NOTE — Progress Notes (Signed)
Pt C/O indigestion on day shift and was given Maalox @ 1745. During report for night shift, pt stated that the Maalox was not effective. On call was paged and a NO was given for Protonix. Pt stated that his condition was unchanged. Pt's pain was reassessed. Pt stated that the pain is left of midline and he gestured to 2 in below nipple line. When asked what he normally took for this pain he stated that he took Vicodin. On call was again paged and a NO was given for Fentanyl. At this time the pt's HR was in the 150's-160's, the pt has a RED MEWS score so RR was called. On call aware. Pt was given more Norco. And after 30-45 min pt stated his pain was beginning to subside. His HR was down to 130's-150's. On call  was paged again and a NO was given for  Digoxin. RR came to the floor and assessed pt. RR put in orders for a CXR and a 500 ml bolus because the pt's BP was 90's /50's.

## 2020-02-20 NOTE — TOC Progression Note (Signed)
Transition of Care (TOC) - Progression Note    Patient Details  Name: Anthony Curry. MRN: [id number] Date of Birth: 1941-02-20  Transition of Care [facility]) CM/SW Contact  Purcell Mouton, RN Phone Number: 02/20/2020, 3:38 PM  Clinical Narrative:    Plan for pt to go to SNF. Will need a COVID test for SNF.  Expected Discharge Plan: [place] Barriers to Discharge: No Barriers Identified  Expected Discharge Plan and Services Expected Discharge Plan: [place]   Discharge Planning Services: CM Consult   Living arrangements for the past 2 months: Single Family Home                                       Social Determinants of Health (SDOH) Interventions    Readmission Risk Interventions Readmission Risk Prevention Plan 01/06/2020  Transportation Screening Complete  Medication Review Press photographer) Complete  Some recent data might be hidden

## 2020-02-20 NOTE — Progress Notes (Signed)
PROGRESS NOTE  Anthony Curry. SJG:[id number] DOB: [birth year] DOA: 02/17/2020 PCP: [name], MD   LOS: 3 days   Brief narrative: As per HPI,  Anthony Curry. is a 79 y.o. 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 fib, SIADH, hypothyroidism, depression, anxiety who was hospitalized from 1/28-01/10/20 for perforated diverticulitis and underwent sigmoid colectomy with colostomy creation,  hospital course complicated by Covid 19 pneumonia, urinary retention requiring indwelling foley, ischemic left hand requiring left brachial embolectomy on 2/1 by vascular surgery ( Dr Oneida Alar), on Eliquis since then, presented  to ED with mechanical fall /left hip pain. Patient had complicated hospital course and was discharged to SNF at last discharge but states was not getting enough help there, felt miserable and couldn't stay there long. Left home the following day. He lost 15-20 pounds weight since last admission. ED Course: Afebrile, pulse 78, respiratory rate 18, blood pressure 146/84--171/69, O2 sat 99% on room air.  WBC 8.3, hemoglobin 14.2 hematocrit 42.9, platelet 428, sodium 132, potassium 4.3, chloride 96, BUN 18, creatinine 0.68, calcium 9.4, INR 1.1, glucose 96. Hip x-ray showed left femoral neck angulated fracture, CXR with COPD changes, no acute infiltrates.    Orthopedics was consulted and patient was admitted to hospital.    Assessment/Plan:  Principal Problem:   Left displaced femoral neck fracture (HCC) Active Problems:   Hyponatremia   BPH (benign prostatic hyperplasia)   GERD (gastroesophageal reflux disease)   Fall   Physical deconditioning   Hypoalbuminemia   Malignant neoplasm of lower lobe of right lung (HCC)   Atrial fibrillation (HCC)   Hypertension   SIADH (syndrome of inappropriate ADH production) ([place])   Diverticulitis   Pericardial effusion   Thromboembolism (HCC)   Hip fracture (HCC)  Left  Femoral neck fracture: Status post left hip hemiarthroplasty by orthopedics on 02/18/2020.  PT has seen the patient.  Disposition plan pending.  Mild grade fever.  Postoperative fever/atelectasis..  Chest x-ray done 02/20/2020 showed new retrocardiac opacity which may represent atelectasis versus infiltrate.  Patient does have mild leukocytosis with slightly trending up.  Will check procalcitonin..  No fever today.  T-max of 99.56F.  Hold off with antibiotic at this time but will consider if there is a spike of fever.  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  Continue ostomy care.  Stage IV non-small cell lung cancer:Patient follows Dr.  Earlie Server and receives immunotherapy as outpatient.   Continue mucolytics and bronchodilators.  Chest x-ray without acute pneumonia.  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  Patient was previously evaluated by cardiology with impression of moderate pericardial effusion likely due to lung CA and recommended continuation of anticoagulation.  Patient had repeat echocardiogram on 2/3-with no tamponade physiology. Patient has been seen by cardiology this time.  Patient will have to follow-up with cardiology as outpatient after discharge.  Paroxysmal atrial fibrillation: Had rapid ventricular response yesterday.  Patient had received 1 dose of Cardizem with better control this morning.  Currently on Eliquis and Cardizem.  Patient was seen by cardiology this admission.  He did have chest discomfort as well which is improved at this time.  Urinary retention:Had foley catheter placed in last admission and had failed voiding trial on 2/4- Continue Flomax, finasteride.  He will need to follow-up urology as outpatient  SIADH/chronic hyponatremia:Likely secondary to lung cancer.  On demeclocycline.  Will continue.  Sodium of 132.  Hypothyroidism: Continue Synthroid  Hypomagnesemia.  Will continue to  replenish orally.  Received 1 dose of IV magnesium sulfate yesterday.  Magnesium 1.8 today.  Potassium of 4.1.  History of COPD/ Emphysema/recent COVID-19 pneumonia: Chest x-ray without infiltrate, continue inhalers.  GERD: Continue PPI  Generalized Deconditioning/debility with protein calorie malnutrition:Patient was discharged to skilled nursing facility in the last admission but decided to go home after that.   Will continue to follow PT recommendations.  VTE Prophylaxis: Apixaban  Code Status: Full code  Family Communication: I did speak with the patient's daughter and mother and updated them about the clinical condition of the patient.    Disposition Plan:  . Patient is from home . Likely disposition to skilled nursing facility/CIR. spoke with the patient's daughter about it. She was very dissatisfied with the skilled nursing facility last time and was discharged to transition of care and orthopedics this time.  . Barriers to discharge: Status post hip surgery, PT recommendations, A. fib with RVR overnight with hypotension requiring digoxin, IV fluids, new retrocardiac density/atelectasis, uncertain disposition.  Consultants:  Orthopedics  Cardiology  Procedures:  Left hip hemiarthroplasty on 02/18/2020  Antibiotics:  . Demeclocycline  Anti-infectives (From admission, onward)   Start     Dose/Rate Route Frequency Ordered Stop   02/18/20 2300  ceFAZolin (ANCEF) IVPB 2g/100 mL premix     2 g 200 mL/hr over 30 Minutes Intravenous Every 6 hours 02/18/20 2036 02/19/20 0446   02/18/20 1300  ceFAZolin (ANCEF) IVPB 2g/100 mL premix     2 g 200 mL/hr over 30 Minutes Intravenous On call to O.R. 02/17/20 2239 02/18/20 1706   02/17/20 2200  demeclocycline (DECLOMYCIN) tablet 300 mg    Note to Pharmacy: Take 2 tablets twice a day     300 mg Oral 2 times daily 02/17/20 1544        Subjective: Today, patient states that he could not sleep well yesterday.  Nursing staff  reported that patient did have atrial fibrillation with rapid milligrams response and needed digoxin, and complained of chest discomfort and was hypotensive.  Objective: Vitals:   02/20/20 0741 02/20/20 0820  BP: 94/60 (!) 103/45  Pulse: (!) 110 (!) 121  Resp: (!) 24 (!) 22  Temp: 97.8 F (36.6 C) 98.5 F (36.9 C)  SpO2: 96% 96%    Intake/Output Summary (Last 24 hours) at 02/20/2020 0956 Last data filed at 02/19/2020 2206 Gross per 24 hour  Intake --  Output 150 ml  Net -150 ml   Filed Weights   02/17/20 1341 02/17/20 2048  Weight: 59 kg 57.3 kg   Body mass index is 17.62 kg/m.   Physical Exam:  GENERAL: Patient is alert awake and oriented. Not in obvious distress.  Thinly built, HENT: No scleral pallor or icterus. Pupils equally reactive to light. Oral mucosa is moist NECK: is supple, no gross swelling noted. CHEST: Clear to auscultation. No crackles or wheezes.  Diminished breath sounds bilaterally. CVS: S1 and S2 heard, no murmur.  Regular rhythm. ABDOMEN: Soft, non-tender, bowel sounds are present.  Foley catheter in place.  Colostomy bag in place. EXTREMITIES: Status post left hip hemiarthroplasty..  Distal pulses palpable. CNS: Cranial nerves are intact.  Following commands.  Moving extremities.  Communicative. SKIN: warm and dry without rashes.  Data Review: I have personally reviewed the following laboratory data and studies,  CBC: Recent Labs  Lab 02/17/20 1407 02/18/20 0734 02/18/20 1856 02/19/20 0314 02/20/20 0415  WBC 8.3  11.3*  --  20.2* 17.1*  NEUTROABS 6.1  --   --   --   --   HGB 14.2 12.6* 9.9* 10.7* 10.6*  HCT 42.9 37.0* 29.0* 32.7* 31.4*  MCV 89.7 88.5  --  91.3 89.2  PLT 428* 379  --  282 834   Basic Metabolic Panel: Recent Labs  Lab 02/17/20 1407 02/18/20 0734 02/18/20 1856 02/19/20 0314 02/20/20 0415  NA 132* 131* 132* 131* 132*  K 4.3 3.7 4.0 3.9 4.1  CL 96* 98 97* 101 102  CO2 27 25  --  22 25  GLUCOSE 96 111* 111* 115* 152*   BUN 18 16 14 16 22   CREATININE 0.68 0.65 0.70 0.74 0.50*  CALCIUM 9.4 8.7*  --  8.1* 8.3*  MG  --   --   --  1.4* 1.8   Liver Function Tests: Recent Labs  Lab 02/17/20 1407  AST 27  ALT 24  ALKPHOS 123  BILITOT 0.6  PROT 6.5  ALBUMIN 3.7   No results for input(s): LIPASE, AMYLASE in the last 168 hours. No results for input(s): AMMONIA in the last 168 hours. Cardiac Enzymes: No results for input(s): CKTOTAL, CKMB, CKMBINDEX, TROPONINI in the last 168 hours. BNP (last 3 results) Recent Labs    06/28/19 0646 01/05/20 1119  BNP 67.4 109.6*    ProBNP (last 3 results) No results for input(s): PROBNP in the last 8760 hours.  CBG: No results for input(s): GLUCAP in the last 168 hours. Recent Results (from the past 240 hour(s))  Surgical pcr screen     Status: Abnormal   Collection Time: 02/17/20 10:52 PM   Specimen: Nasal Mucosa; Nasal Swab  Result Value Ref Range Status   MRSA, PCR NEGATIVE NEGATIVE Final   Staphylococcus aureus POSITIVE (A) NEGATIVE Final    Comment: (NOTE) The Xpert SA Assay (FDA approved for NASAL specimens in patients 43 years of age and older), is one component of a comprehensive surveillance program. It is not intended to diagnose infection nor to guide or monitor treatment. Performed at [facility], [place] [address], [place], [place] [address]      Studies: DG Chest Port 1 View  Result Date: 02/20/2020 CLINICAL DATA:  Shortness of breath this morning. EXAM: PORTABLE CHEST 1 VIEW COMPARISON:  02/17/2020 FINDINGS: Heart size is within normal limits. New opacity is seen in the retrocardiac lung base on today's study which could be due to atelectasis or infiltrate. Mild atelectasis is seen in the right lung base. No evidence of pleural effusion or pneumothorax. IMPRESSION: 1. New retrocardiac opacity, which may represent atelectasis versus infiltrate. Recommend continued chest radiographic follow-up. 2. Mild right basilar  atelectasis. Electronically Signed   By: Marlaine Hind M.D.   On: 02/20/2020 08:07   DG Hip Port Unilat With Pelvis 1V Left  Result Date: 02/18/2020 CLINICAL DATA:  Status post total left hip replacement. EXAM: DG HIP (WITH OR WITHOUT PELVIS) 1V PORT LEFT COMPARISON:  February 17, 2020 FINDINGS: There is no evidence of an acute hip fracture or dislocation. A stable 1.8 cm x 0.8 cm sclerotic focus is seen just below the left acetabulum. A left hip replacement is seen without evidence of surrounding lucency to suggest the presence of hardware loosening or infection. Marked severity degenerative changes seen within the visualized portion of the lower lumbar spine. Soft tissue structures are unremarkable. IMPRESSION: 1. Status post left hip replacement without evidence of hardware loosening or infection. Electronically Signed  By: Virgina Norfolk M.D.   On: 02/18/2020 19:48      Flora Lipps, MD  [facility] 02/20/2020

## 2020-02-20 NOTE — Telephone Encounter (Signed)
He is in hospital . Cancelled appts for next week.  He will call back after he is discharged to set up next appts.

## 2020-02-20 NOTE — Progress Notes (Signed)
Physical Therapy Treatment Patient Details Name: Anthony Curry. MRN: [id number] DOB: Apr 26, 1941 Today's Date: 02/20/2020    History of Present Illness Patient is 79 y.o. male s/p fall at home on 02/17/20 while transfering with RW. Pt found to have displaced left femoral neck fracture and now s/p Left hip hemiarthroplasty on 02/18/20.  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    PT Comments    Pt assisted OOB to recliner.  Pt with issues with HR this morning and HR 135 bpm with standing so deferred ambulation today.  Reviewed posterior hip precautions as pt unable to recall all of them and pt requires frequent cues for maintaining precautions during mobility.  Pt left with pillow between legs as precaution reminder (spouse states pt crossed legs often prior to admission).  Continue to recommend SNF upon d/c.    Follow Up Recommendations  SNF;Supervision/Assistance - 24 hour     Equipment Recommendations  None recommended by PT    Recommendations for Other Services       Precautions / Restrictions Precautions Precautions: Posterior Hip;Fall Precaution Comments: monitor HR; pt only able to recall 2/3 posterior hip precautions Restrictions Weight Bearing Restrictions: No    Mobility  Bed Mobility Overal bed mobility: Needs Assistance Bed Mobility: Supine to Sit     Supine to sit: Min assist     General bed mobility comments: cues for maintaining posterior hip precautions, assist to raise trunk. Pt requried cues sitting EOB to deter forward trunk lean and repeated  reminders for posterior hip precautions.  Transfers Overall transfer level: Needs assistance Equipment used: Rolling walker (2 wheeled) Transfers: Sit to/from [facility] Sit to Stand: Min assist;+2 safety/equipment Stand pivot transfers: Min assist;+2 safety/equipment       General transfer comment: pt require repeated cues for safety and to maintain posterior hip precautions during mobility. min assist to rise, steady, and control descent; HR 135 bpm with standing so deferred ambulation today  Ambulation/Gait                 Stairs             Wheelchair Mobility    Modified Rankin (Stroke Patients Only)       Balance Overall balance assessment: History of Falls;Needs assistance         Standing balance support: During functional activity;Bilateral upper extremity supported Standing balance-Leahy Scale: Poor                              Cognition Arousal/Alertness: Awake/alert Behavior During Therapy: WFL for tasks assessed/performed Overall Cognitive Status: Within Functional Limits for tasks assessed                                        Exercises      General Comments        Pertinent Vitals/Pain Pain Assessment: Faces Faces Pain Scale: Hurts little more Pain Location: Lt hip Pain Descriptors / Indicators: Grimacing;Guarding;Discomfort Pain Intervention(s): Monitored during session;Repositioned  Home Living                      Prior Function            PT Goals (current goals can now be found in the care plan section) Progress towards PT goals: Progressing toward goals    Frequency    Min 3X/week      PT Plan Current plan remains appropriate;Frequency needs to be updated    Co-evaluation              AM-PAC PT "6 Clicks" Mobility   Outcome Measure  Help needed turning from your back to your side while in a flat bed without using bedrails?: A Little Help  needed moving from lying on your back to sitting on the side of a flat bed without using bedrails?: A Little Help needed moving to and from a bed to a chair (including a wheelchair)?: A Little Help needed standing up from a chair using your arms (e.g., wheelchair or bedside chair)?: A Little Help needed to walk in hospital room?: A Lot Help needed climbing 3-5 steps with a railing? : Total 6 Click Score: 15    End of Session Equipment Utilized During Treatment: Gait belt Activity Tolerance: Patient tolerated treatment well Patient left: in chair;with call bell/phone within reach;with chair alarm set;with family/visitor present   PT Visit Diagnosis: Muscle weakness (generalized) (M62.81);Difficulty in walking, not elsewhere classified (R26.2)     Time: 1610-9604 PT Time Calculation (min) (ACUTE ONLY): 11 min  Charges:  $Therapeutic Activity: 8-22 mins                     Arlyce Dice, DPT Acute Rehabilitation Services Office: [phone]  Anthony Curry 02/20/2020, 12:17 PM

## 2020-02-20 NOTE — Progress Notes (Signed)
Subjective: 2 Days Post-Op s/p Procedure(s): LEFT HIP HEMIARTHROPLASTY   Patient is alert, oriented, laying in bed. Patient reports that pain is well controlled. Denies shortness of breath, nausea, or vomiting. Patient does state he has had some epigastric pain and has been coughing. States the epigastric pain is normal for him but normally he takes narcotics to help with the pain.   Objective:  PE: VITALS:   Vitals:   02/20/20 0700 02/20/20 0741 02/20/20 0820 02/20/20 1010  BP: (!) 90/52 94/60 (!) 103/45 (!) 112/56  Pulse:  (!) 110 (!) 121 (!) 109  Resp:  (!) 24 (!) 22 (!) 24  Temp: 99 F (37.2 C) 97.8 F (36.6 C) 98.5 F (36.9 C) 98.1 F (36.7 C)  TempSrc: Oral Oral Oral Oral  SpO2:  96% 96% 94%  Weight:      Height:        ABD soft Neurovascular intact Sensation intact distally Intact pulses distally Dorsiflexion/Plantar flexion intact Incision: scant drainage  LABS  Results for orders placed or performed during the hospital encounter of 02/17/20 (from the past 24 hour(s))  CBC     Status: Abnormal   Collection Time: 02/20/20  4:15 AM  Result Value Ref Range   WBC 17.1 (H) 4.0 - 10.5 K/uL   RBC 3.52 (L) 4.22 - 5.81 MIL/uL   Hemoglobin 10.6 (L) 13.0 - 17.0 g/dL   HCT 31.4 (L) 39.0 - 52.0 %   MCV 89.2 80.0 - 100.0 fL   MCH 30.1 26.0 - 34.0 pg   MCHC 33.8 30.0 - 36.0 g/dL   RDW 14.8 11.5 - 15.5 %   Platelets 266 150 - 400 K/uL   nRBC 0.0 0.0 - 0.2 %  Basic metabolic panel     Status: Abnormal   Collection Time: 02/20/20  4:15 AM  Result Value Ref Range   Sodium 132 (L) 135 - 145 mmol/L   Potassium 4.1 3.5 - 5.1 mmol/L   Chloride 102 98 - 111 mmol/L   CO2 25 22 - 32 mmol/L   Glucose, Bld 152 (H) 70 - 99 mg/dL   BUN 22 8 - 23 mg/dL   Creatinine, Ser 0.50 (L) 0.61 - 1.24 mg/dL   Calcium 8.3 (L) 8.9 - 10.3 mg/dL   GFR calc non Af Amer >60 >60 mL/min   GFR calc Af Amer >60 >60 mL/min   Anion gap 5 5 - 15  Magnesium     Status: None   Collection Time:  02/20/20  4:15 AM  Result Value Ref Range   Magnesium 1.8 1.7 - 2.4 mg/dL   *Note: Due to a large number of results and/or encounters for the requested time period, some results have not been displayed. A complete set of results can be found in Results Review.    DG Chest Port 1 View  Result Date: 02/20/2020 CLINICAL DATA:  Shortness of breath this morning. EXAM: PORTABLE CHEST 1 VIEW COMPARISON:  02/17/2020 FINDINGS: Heart size is within normal limits. New opacity is seen in the retrocardiac lung base on today's study which could be due to atelectasis or infiltrate. Mild atelectasis is seen in the right lung base. No evidence of pleural effusion or pneumothorax. IMPRESSION: 1. New retrocardiac opacity, which may represent atelectasis versus infiltrate. Recommend continued chest radiographic follow-up. 2. Mild right basilar atelectasis. Electronically Signed   By: Marlaine Hind M.D.   On: 02/20/2020 08:07   DG Hip Port Unilat With Pelvis 1V Left  Result  Date: 02/18/2020 CLINICAL DATA:  Status post total left hip replacement. EXAM: DG HIP (WITH OR WITHOUT PELVIS) 1V PORT LEFT COMPARISON:  February 17, 2020 FINDINGS: There is no evidence of an acute hip fracture or dislocation. A stable 1.8 cm x 0.8 cm sclerotic focus is seen just below the left acetabulum. A left hip replacement is seen without evidence of surrounding lucency to suggest the presence of hardware loosening or infection. Marked severity degenerative changes seen within the visualized portion of the lower lumbar spine. Soft tissue structures are unremarkable. IMPRESSION: 1. Status post left hip replacement without evidence of hardware loosening or infection. Electronically Signed   By: Virgina Norfolk M.D.   On: 02/18/2020 19:48    Assessment/Plan: Principal Problem:   Left displaced femoral neck fracture (HCC) Active Problems:   Hyponatremia   BPH (benign prostatic hyperplasia)   GERD (gastroesophageal reflux disease)   Fall    Physical deconditioning   Hypoalbuminemia   Malignant neoplasm of lower lobe of right lung (HCC)   Atrial fibrillation (HCC)   Hypertension   SIADH (syndrome of inappropriate ADH production) ([place])   Diverticulitis   Pericardial effusion   Thromboembolism (HCC)   Hip fracture (HCC)  2 Days Post-Op s/p Procedure(s): LEFT HIP HEMIARTHROPLASTY  Weightbearing: WBAT LLE, up with therapy. Noted patient has issues with dizziness and neuropathy so will see how he progresses with therapy.  Insicional and dressing care: PRN dressing changes VTE prophylaxis: Eliquis, SCDs Pain control: continue current pain control regimen Follow - up plan: Follow-up with Dr. Mardelle Matte 2 weeks after discharge Dispo: Home with HHPT vs SNF when medically optimized, likely discharge to SNF, daughter indicated in my conversation with her that she did not think family was equipped to take care of patient at home. TOC consult placed.  Contact information:   Weekdays 8-5 [address], [place] [phone] A fter hours and holidays please check [email or web address] for group call information for [facility]  Ventura Bruns 02/20/2020, 10:31 AM

## 2020-02-21 DIAGNOSIS — Z9889 Other specified postprocedural states: Secondary | ICD-10-CM

## 2020-02-21 DIAGNOSIS — Z8781 Personal history of (healed) traumatic fracture: Secondary | ICD-10-CM

## 2020-02-21 DIAGNOSIS — R0603 Acute respiratory distress: Secondary | ICD-10-CM

## 2020-02-21 LAB — CBC
HCT: 30.8 % — ABNORMAL LOW (ref 39.0–52.0)
Hemoglobin: 10.2 g/dL — ABNORMAL LOW (ref 13.0–17.0)
MCH: 30.5 pg (ref 26.0–34.0)
MCHC: 33.1 g/dL (ref 30.0–36.0)
MCV: 92.2 fL (ref 80.0–100.0)
Platelets: 270 10*3/uL (ref 150–400)
RBC: 3.34 MIL/uL — ABNORMAL LOW (ref 4.22–5.81)
RDW: 15.2 % (ref 11.5–15.5)
WBC: 13.1 10*3/uL — ABNORMAL HIGH (ref 4.0–10.5)
nRBC: 0 % (ref 0.0–0.2)

## 2020-02-21 LAB — SURGICAL PATHOLOGY

## 2020-02-21 LAB — BASIC METABOLIC PANEL
Anion gap: 4 — ABNORMAL LOW (ref 5–15)
BUN: 29 mg/dL — ABNORMAL HIGH (ref 8–23)
CO2: 28 mmol/L (ref 22–32)
Calcium: 8.8 mg/dL — ABNORMAL LOW (ref 8.9–10.3)
Chloride: 104 mmol/L (ref 98–111)
Creatinine, Ser: 0.58 mg/dL — ABNORMAL LOW (ref 0.61–1.24)
GFR calc Af Amer: >60 mL/min (ref >60)
GFR calc non Af Amer: >60 mL/min (ref >60)
Glucose, Bld: 117 mg/dL — ABNORMAL HIGH (ref 70–99)
Potassium: 5.3 mmol/L — ABNORMAL HIGH (ref 3.5–5.1)
Sodium: 136 mmol/L (ref 135–145)

## 2020-02-21 LAB — PROCALCITONIN: Procalcitonin: 0.97 ng/mL

## 2020-02-21 LAB — MAGNESIUM: Magnesium: 1.9 mg/dL (ref 1.7–2.4)

## 2020-02-21 MED ORDER — AMOXICILLIN-POT CLAVULANATE 875-125 MG PO TABS
1.0000 | ORAL_TABLET | Freq: Two times a day (BID) | ORAL | Status: DC
  Administered 2020-02-21 – 2020-02-25 (×9): 1 via ORAL
  Filled 2020-02-21 (×9): qty 1

## 2020-02-21 NOTE — Progress Notes (Signed)
PROGRESS NOTE  Anthony Curry. RFF:[id number] DOB: 10/29/1941 DOA: 02/17/2020 PCP: [name], MD   LOS: 4 days   Brief narrative: As per HPI,  Anthony Curry. is a 79 y.o. 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 fib, SIADH, hypothyroidism, depression, anxiety who was hospitalized from 1/28-01/10/20 for perforated diverticulitis and underwent sigmoid colectomy with colostomy creation,  hospital course complicated by Covid 19 pneumonia, urinary retention requiring indwelling foley, ischemic left hand requiring left brachial embolectomy on 2/1 by vascular surgery ( Dr Oneida Alar), on Eliquis since then, presented  to ED with mechanical fall /left hip pain. Patient had complicated hospital course and was discharged to SNF at last discharge but states was not getting enough help there, felt miserable and couldn't stay there long. Left home the following day. He lost 15-20 pounds weight since last admission. ED Course: Afebrile, pulse 78, respiratory rate 18, blood pressure 146/84--171/69, O2 sat 99% on room air.  WBC 8.3, hemoglobin 14.2 hematocrit 42.9, platelet 428, sodium 132, potassium 4.3, chloride 96, BUN 18, creatinine 0.68, calcium 9.4, INR 1.1, glucose 96. Hip x-ray showed left femoral neck angulated fracture, CXR with COPD changes, no acute infiltrates.    Orthopedics was consulted and patient was admitted to hospital.    Assessment/Plan:  Principal Problem:   Left displaced femoral neck fracture (HCC) Active Problems:   Hyponatremia   BPH (benign prostatic hyperplasia)   GERD (gastroesophageal reflux disease)   Fall   Physical deconditioning   Hypoalbuminemia   Malignant neoplasm of lower lobe of right lung (HCC)   Atrial fibrillation (HCC)   Hypertension   SIADH (syndrome of inappropriate ADH production) ([place])   Diverticulitis   Pericardial effusion   Thromboembolism (HCC)   Hip fracture (HCC)  Left  Femoral neck fracture: Status post left hip hemiarthroplasty by orthopedics on 02/18/2020.  PT has seen the patient and patient has been recommended skilled nursing facility placement for rehab.  Mild grade fever.  Postoperative fever/atelectasis..  Chest x-ray done 02/20/2020 showed new retrocardiac opacity which may represent atelectasis versus infiltrate.  Patient does have mild leukocytosis now downtrending..  No fever but mildly elevated procalcitonin.  T-max of 99.56F yesterday evening.  Latest temperature of 98 F.  Continue incentive spirometry.  Will add oral Augmentin at this time  Recent complicated diverticulitis: s/p surgery and is s/p colostomy.on oral diet.  Continue ostomy care.  Stage IV non-small cell lung cancer:Patient follows Dr.  Earlie Server and receives immunotherapy as outpatient.   Continue mucolytics and bronchodilators.   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  Patient was previously evaluated by cardiology with impression of moderate pericardial effusion likely due to lung CA and recommended continuation of anticoagulation.  Patient had repeat echocardiogram on 2/3-with no tamponade physiology. Patient has been seen by cardiology this time.  Patient will have to follow-up with cardiology as outpatient after discharge.  Paroxysmal atrial fibrillation: Had rapid ventricular response during hospitalization.  Currently on Eliquis and Cardizem.  Current heart rate at 98 bpm.  Patient was seen by cardiology this admission.    Urinary retention:Had foley catheter placed in last admission and had failed voiding trial on 2/4- Continue Flomax, finasteride.    Patient is supposed to follow-up with urology as outpatient for addressing his Foley catheter.  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  On demeclocycline.  Will continue.  Sodium of 136.  Hypothyroidism: Continue Synthroid  Hypomagnesemia.  Replenished.  Magnesium of 1.9.   Borderline hyperkalemia.  Will closely monitor.  Potassium of 5.3.  History of COPD/ Emphysema/recent COVID-19 pneumonia: Chest x-ray with new retrocardiac density.  With mild grade fever and leukocytosis will start on Augmentin.  On nasal cannula oxygen at 2 L/min.  GERD: Continue PPI  Generalized Deconditioning/debility with protein calorie malnutrition:Patient was discharged to skilled nursing facility in the last admission but decided to go home after that.   Will continue to follow PT recommendations.  VTE Prophylaxis: Apixaban  Code Status: Full code  Family Communication: None today.  Spoke with the patient's mother and daughter yesterday.    Disposition Plan:  . Patient is from home . Likely disposition to skilled nursing facility.  Family dissatisfied with the skilled nursing facility last time   . Barriers to discharge: Skilled nursing facility placement, new retrocardiac density/atelectasis, wean oxygen as able  Consultants:  Orthopedics  Cardiology  Procedures:  Left hip hemiarthroplasty on 02/18/2020  Antibiotics:  . Demeclocycline  Anti-infectives (From admission, onward)   Start     Dose/Rate Route Frequency Ordered Stop   02/18/20 2300  ceFAZolin (ANCEF) IVPB 2g/100 mL premix     2 g 200 mL/hr over 30 Minutes Intravenous Every 6 hours 02/18/20 2036 02/19/20 0446   02/18/20 1300  ceFAZolin (ANCEF) IVPB 2g/100 mL premix     2 g 200 mL/hr over 30 Minutes Intravenous On call to O.R. 02/17/20 2239 02/18/20 1706   02/17/20 2200  demeclocycline (DECLOMYCIN) tablet 300 mg    Note to Pharmacy: Take 2 tablets twice a day     300 mg Oral 2 times daily 02/17/20 1544        Subjective: Today, patient denies any chest pain fever chills but has mild cough.  Denies any nausea, vomiting or chest pain.  Denies overt pain.  Objective: Vitals:   02/21/20 0856 02/21/20 1017  BP: 125/60 118/79  Pulse: 92 95  Resp: (!) 28 (!) 24  Temp: 98 F (36.7 C) 98.3 F (36.8  C)  SpO2: 94% 97%    Intake/Output Summary (Last 24 hours) at 02/21/2020 1427 Last data filed at 02/21/2020 0930 Gross per 24 hour  Intake 600 ml  Output 1100 ml  Net -500 ml   Filed Weights   02/17/20 1341 02/17/20 2048  Weight: 59 kg 57.3 kg   Body mass index is 17.62 kg/m.   Physical Exam:  General: Alert awake and communicative.  Thinly built. HENT: Normocephalic, pupils equally reacting to light and accommodation.  No scleral pallor or icterus noted. Oral mucosa is moist.  Chest: Diminished breath sounds bilaterally. CVS: S1 &S2 heard. No murmur.  Regular rate and rhythm. Abdomen: Soft, nontender, nondistended.  Bowel sounds are heard.  Colostomy bag in place.  Foley catheter in place.   Extremities: No cyanosis, clubbing or edema.  Status post left hip hemiarthroplasty. Psych: Alert, awake and oriented, normal mood CNS:  No cranial nerve deficits.  Moving extremities.   Skin: Warm and dry.  Status post left hip hemiarthroplasty.   Data Review: I have personally reviewed the following laboratory data and studies,  CBC: Recent Labs  Lab 02/17/20 1407 02/17/20 1407 02/18/20 0734 02/18/20 1856 02/19/20 0314 02/20/20 0415 02/21/20 0409  WBC 8.3  --  11.3*  --  20.2* 17.1* 13.1*  NEUTROABS 6.1  --   --   --   --   --   --   HGB 14.2   < > 12.6*  9.9* 10.7* 10.6* 10.2*  HCT 42.9   < > 37.0* 29.0* 32.7* 31.4* 30.8*  MCV 89.7  --  88.5  --  91.3 89.2 92.2  PLT 428*  --  379  --  282 266 270   < > = values in this interval not displayed.   Basic Metabolic Panel: Recent Labs  Lab 02/17/20 1407 02/17/20 1407 02/18/20 0734 02/18/20 1856 02/19/20 0314 02/20/20 0415 02/21/20 0409  NA 132*   < > 131* 132* 131* 132* 136  K 4.3   < > 3.7 4.0 3.9 4.1 5.3*  CL 96*   < > 98 97* 101 102 104  CO2 27  --  25  --  22 25 28   GLUCOSE 96   < > 111* 111* 115* 152* 117*  BUN 18   < > 16 14 16 22  29*  CREATININE 0.68   < > 0.65 0.70 0.74 0.50* 0.58*  CALCIUM 9.4  --  8.7*  --   8.1* 8.3* 8.8*  MG  --   --   --   --  1.4* 1.8 1.9   < > = values in this interval not displayed.   Liver Function Tests: Recent Labs  Lab 02/17/20 1407  AST 27  ALT 24  ALKPHOS 123  BILITOT 0.6  PROT 6.5  ALBUMIN 3.7   No results for input(s): LIPASE, AMYLASE in the last 168 hours. No results for input(s): AMMONIA in the last 168 hours. Cardiac Enzymes: No results for input(s): CKTOTAL, CKMB, CKMBINDEX, TROPONINI in the last 168 hours. BNP (last 3 results) Recent Labs    06/28/19 0646 01/05/20 1119  BNP 67.4 109.6*    ProBNP (last 3 results) No results for input(s): PROBNP in the last 8760 hours.  CBG: No results for input(s): GLUCAP in the last 168 hours. Recent Results (from the past 240 hour(s))  Surgical pcr screen     Status: Abnormal   Collection Time: 02/17/20 10:52 PM   Specimen: Nasal Mucosa; Nasal Swab  Result Value Ref Range Status   MRSA, PCR NEGATIVE NEGATIVE Final   Staphylococcus aureus POSITIVE (A) NEGATIVE Final    Comment: (NOTE) The Xpert SA Assay (FDA approved for NASAL specimens in patients 13 years of age and older), is one component of a comprehensive surveillance program. It is not intended to diagnose infection nor to guide or monitor treatment. Performed at [facility], [place] [address], [place], [place] [address]      Studies: DG Chest Port 1 View  Result Date: 02/20/2020 CLINICAL DATA:  Shortness of breath this morning. EXAM: PORTABLE CHEST 1 VIEW COMPARISON:  02/17/2020 FINDINGS: Heart size is within normal limits. New opacity is seen in the retrocardiac lung base on today's study which could be due to atelectasis or infiltrate. Mild atelectasis is seen in the right lung base. No evidence of pleural effusion or pneumothorax. IMPRESSION: 1. New retrocardiac opacity, which may represent atelectasis versus infiltrate. Recommend continued chest radiographic follow-up. 2. Mild right basilar atelectasis. Electronically  Signed   By: Marlaine Hind M.D.   On: 02/20/2020 08:07      Flora Lipps, MD  [facility] 02/21/2020

## 2020-02-21 NOTE — Progress Notes (Signed)
Subjective: 3 Days Post-Op s/p Procedure(s): LEFT HIP HEMIARTHROPLASTY   Patient is alert, oriented, laying in bed.  Patient reports pain as well controlled this morning. No other complaints.   Objective:  PE: VITALS:   Vitals:   02/20/20 1803 02/20/20 1806 02/20/20 2205 02/21/20 0605  BP: 128/90 128/90 (!) 116/59 110/74  Pulse:  89 94 (!) 117  Resp:  20 (!) 22   Temp: 98.8 F (37.1 C) 98.8 F (37.1 C) 99.5 F (37.5 C) 98.5 F (36.9 C)  TempSrc: Oral Oral Oral Oral  SpO2:  92% 96% 93%  Weight:      Height:        ABD soft Neurovascular intact Sensation intact distally Intact pulses distally Dorsiflexion/Plantar flexion intact Incision: scant drainage  LABS  Results for orders placed or performed during the hospital encounter of 02/17/20 (from the past 24 hour(s))  CBC     Status: Abnormal   Collection Time: 02/21/20  4:09 AM  Result Value Ref Range   WBC 13.1 (H) 4.0 - 10.5 K/uL   RBC 3.34 (L) 4.22 - 5.81 MIL/uL   Hemoglobin 10.2 (L) 13.0 - 17.0 g/dL   HCT 30.8 (L) 39.0 - 52.0 %   MCV 92.2 80.0 - 100.0 fL   MCH 30.5 26.0 - 34.0 pg   MCHC 33.1 30.0 - 36.0 g/dL   RDW 15.2 11.5 - 15.5 %   Platelets 270 150 - 400 K/uL   nRBC 0.0 0.0 - 0.2 %  Basic metabolic panel     Status: Abnormal   Collection Time: 02/21/20  4:09 AM  Result Value Ref Range   Sodium 136 135 - 145 mmol/L   Potassium 5.3 (H) 3.5 - 5.1 mmol/L   Chloride 104 98 - 111 mmol/L   CO2 28 22 - 32 mmol/L   Glucose, Bld 117 (H) 70 - 99 mg/dL   BUN 29 (H) 8 - 23 mg/dL   Creatinine, Ser 0.58 (L) 0.61 - 1.24 mg/dL   Calcium 8.8 (L) 8.9 - 10.3 mg/dL   GFR calc non Af Amer >60 >60 mL/min   GFR calc Af Amer >60 >60 mL/min   Anion gap 4 (L) 5 - 15  Magnesium     Status: None   Collection Time: 02/21/20  4:09 AM  Result Value Ref Range   Magnesium 1.9 1.7 - 2.4 mg/dL  Procalcitonin     Status: None   Collection Time: 02/21/20  4:09 AM  Result Value Ref Range   Procalcitonin 0.97 ng/mL    *Note: Due to a large number of results and/or encounters for the requested time period, some results have not been displayed. A complete set of results can be found in Results Review.    DG Chest Port 1 View  Result Date: 02/20/2020 CLINICAL DATA:  Shortness of breath this morning. EXAM: PORTABLE CHEST 1 VIEW COMPARISON:  02/17/2020 FINDINGS: Heart size is within normal limits. New opacity is seen in the retrocardiac lung base on today's study which could be due to atelectasis or infiltrate. Mild atelectasis is seen in the right lung base. No evidence of pleural effusion or pneumothorax. IMPRESSION: 1. New retrocardiac opacity, which may represent atelectasis versus infiltrate. Recommend continued chest radiographic follow-up. 2. Mild right basilar atelectasis. Electronically Signed   By: Marlaine Hind M.D.   On: 02/20/2020 08:07    Assessment/Plan: Principal Problem:   Left displaced femoral neck fracture (HCC) Active Problems:   Hyponatremia   BPH (benign  prostatic hyperplasia)   GERD (gastroesophageal reflux disease)   Fall   Physical deconditioning   Hypoalbuminemia   Malignant neoplasm of lower lobe of right lung (HCC)   Atrial fibrillation (HCC)   Hypertension   SIADH (syndrome of inappropriate ADH production) ([place])   Diverticulitis   Pericardial effusion   Thromboembolism (HCC)   Hip fracture (HCC)    3 Days Post-Op s/p Procedure(s): LEFT HIP HEMIARTHROPLASTY  Weightbearing:WBAT LLE, up with therapy. Noted patient has issues with dizziness and neuropathy so will see how he progresses with therapy. Insicional and dressing care:PRN dressing changes VTE prophylaxis:Eliquis, SCDs Pain control:continue current pain control regimen Follow - up plan:Follow-up with Dr. Mardelle Matte 2 weeks after discharge Dispo: SNF, TOC placed. Likely discharge early next week.    Contact information:   Weekdays 8-5 [address], [place] [phone] A fter hours and holidays please check  [email or web address] for group call information for [facility]  Ventura Bruns 02/21/2020, 8:35 AM

## 2020-02-21 NOTE — Progress Notes (Signed)
Physical Therapy Treatment Patient Details Name: Anthony Curry. MRN: [id number] DOB: Jun 04, 1941 Today's Date: 02/21/2020    History of Present Illness Patient is 79 y.o. male s/p fall at home on 02/17/20 while transfering with RW. Pt found to have displaced left femoral neck fracture and now s/p Left hip hemiarthroplasty on 02/18/20.  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    PT Comments    Pt assisted to recliner and felt too fatigued to ambulate today.  Pt performed LE exercises in recliner however fatigued quickly.  Continue to recommend SNF upon d/c.     Follow Up Recommendations  SNF;Supervision/Assistance - 24 hour     Equipment Recommendations  None recommended by PT    Recommendations for Other Services       Precautions / Restrictions Precautions Precautions: Posterior Hip;Fall Precaution Comments: monitor HR; reviewed posterior hip precautions    Mobility  Bed Mobility Overal bed mobility: Needs Assistance Bed Mobility: Supine to Sit     Supine to sit: Min assist;Mod assist     General bed mobility comments: cues for maintaining posterior hip precautions, assist for L LE and to raise trunk. Pt requried cues sitting EOB to deter forward trunk lean and repeated reminders for posterior hip precautions.  Transfers Overall transfer level: Needs assistance Equipment used: Rolling walker (2 wheeled) Transfers: Sit to/from [facility] Sit to Stand: Min assist Stand pivot transfers: Min assist       General transfer comment: pt  require repeated cues for safety and to maintain posterior hip precautions during mobility. min assist to rise, steady, and control descent; HR 105 bpm after transfer; pt felt too fatigued to ambulate today  Ambulation/Gait                 Stairs             Wheelchair Mobility    Modified Rankin (Stroke Patients Only)       Balance                                            Cognition Arousal/Alertness: Awake/alert Behavior During Therapy: WFL for tasks assessed/performed Overall Cognitive Status: Within Functional Limits for tasks assessed                                        Exercises General Exercises - Lower Extremity Ankle Circles/Pumps: AROM;Both;10 reps Quad Sets: AROM;Left;10 reps Long Arc Quad: AROM;Both;10 reps;Seated Hip ABduction/ADduction: AROM;Both;10 reps Straight Leg Raises: AROM;Right;5 reps    General Comments        Pertinent Vitals/Pain Pain Assessment: Faces Faces Pain Scale: Hurts little more Pain Location: Lt hip Pain Descriptors / Indicators: Grimacing;Guarding;Discomfort Pain Intervention(s): Monitored during session;Repositioned    Home Living                      Prior Function  PT Goals (current goals can now be found in the care plan section) Progress towards PT goals: Progressing toward goals    Frequency    Min 3X/week      PT Plan Current plan remains appropriate    Co-evaluation              AM-PAC PT "6 Clicks" Mobility   Outcome Measure  Help needed turning from your back to your side while in a flat bed without using bedrails?: A Little Help needed moving from lying on your back to sitting on the side of a flat bed without using bedrails?: A Little Help needed moving to and from a bed to a chair (including a wheelchair)?: A Little Help needed standing up from a chair using your arms (e.g., wheelchair or bedside chair)?: A Little Help  needed to walk in hospital room?: A Lot Help needed climbing 3-5 steps with a railing? : Total 6 Click Score: 15    End of Session Equipment Utilized During Treatment: Gait belt;Oxygen Activity Tolerance: Patient tolerated treatment well Patient left: in chair;with call bell/phone within reach;with chair alarm set;with family/visitor present Nurse Communication: Mobility status PT Visit Diagnosis: Muscle weakness (generalized) (M62.81);Difficulty in walking, not elsewhere classified (R26.2)     Time: 5726-2035 PT Time Calculation (min) (ACUTE ONLY): 21 min  Charges:  $Therapeutic Activity: 8-22 mins                     Arlyce Dice, DPT Acute Rehabilitation Services Office: [phone]   Trena Platt 02/21/2020, 4:21 PM

## 2020-02-22 ENCOUNTER — Inpatient Hospital Stay (HOSPITAL_COMMUNITY): Payer: Medicare Other

## 2020-02-22 ENCOUNTER — Other Ambulatory Visit: Payer: Self-pay | Admitting: Internal Medicine

## 2020-02-22 LAB — CBC
HCT: 31.3 % — ABNORMAL LOW (ref 39.0–52.0)
Hemoglobin: 10.2 g/dL — ABNORMAL LOW (ref 13.0–17.0)
MCH: 29.9 pg (ref 26.0–34.0)
MCHC: 32.6 g/dL (ref 30.0–36.0)
MCV: 91.8 fL (ref 80.0–100.0)
Platelets: 308 10*3/uL (ref 150–400)
RBC: 3.41 MIL/uL — ABNORMAL LOW (ref 4.22–5.81)
RDW: 15.3 % (ref 11.5–15.5)
WBC: 11.3 10*3/uL — ABNORMAL HIGH (ref 4.0–10.5)
nRBC: 0 % (ref 0.0–0.2)

## 2020-02-22 LAB — BASIC METABOLIC PANEL
Anion gap: 9 (ref 5–15)
BUN: 30 mg/dL — ABNORMAL HIGH (ref 8–23)
CO2: 28 mmol/L (ref 22–32)
Calcium: 8.4 mg/dL — ABNORMAL LOW (ref 8.9–10.3)
Chloride: 98 mmol/L (ref 98–111)
Creatinine, Ser: 0.49 mg/dL — ABNORMAL LOW (ref 0.61–1.24)
GFR calc Af Amer: >60 mL/min (ref >60)
GFR calc non Af Amer: >60 mL/min (ref >60)
Glucose, Bld: 105 mg/dL — ABNORMAL HIGH (ref 70–99)
Potassium: 4.8 mmol/L (ref 3.5–5.1)
Sodium: 135 mmol/L (ref 135–145)

## 2020-02-22 LAB — TROPONIN I (HIGH SENSITIVITY)
Troponin I (High Sensitivity): 23 ng/L — ABNORMAL HIGH (ref <18)
Troponin I (High Sensitivity): 24 ng/L — ABNORMAL HIGH (ref <18)

## 2020-02-22 LAB — MAGNESIUM: Magnesium: 1.7 mg/dL (ref 1.7–2.4)

## 2020-02-22 MED ORDER — MAGNESIUM SULFATE 2 GM/50ML IV SOLN
2.0000 g | Freq: Once | INTRAVENOUS | Status: AC
  Administered 2020-02-22: 2 g via INTRAVENOUS
  Filled 2020-02-22: qty 50

## 2020-02-22 MED ORDER — DILTIAZEM HCL 30 MG PO TABS
30.0000 mg | ORAL_TABLET | Freq: Once | ORAL | Status: AC
  Administered 2020-02-22: 30 mg via ORAL
  Filled 2020-02-22: qty 1

## 2020-02-22 MED ORDER — LEVALBUTEROL HCL 0.63 MG/3ML IN NEBU: 0.6300 mg | INHALATION_SOLUTION | Freq: Four times a day (QID) | RESPIRATORY_TRACT | Status: DC | PRN

## 2020-02-22 NOTE — Progress Notes (Addendum)
Orthopaedic Trauma Service Progress Note  Patient ID: Anthony Curry. MRN: [id number] DOB/AGE: Oct 21, 1941 79 y.o.  Subjective:  Ortho issues stable Therapy notes reviewed Pt severely deconditioned due to multiple recent hospitalizations, surgeries and COVID 19 infection   Agreeable to return to snf but a different one   ROS As above  Objective:   VITALS:   Vitals:   02/22/20 0956 02/22/20 1223 02/22/20 1353 02/22/20 1400  BP:  (!) 120/57 120/67   Pulse:  (!) 106 (!) 112   Resp: (!) 25 (!) 32  (!) 31  Temp:  98.1 F (36.7 C) 98.3 F (36.8 C)   TempSrc:  Oral Oral   SpO2:  98% 92%   Weight:      Height:        Estimated body mass index is 17.62 kg/m as calculated from the following:   Height as of this encounter: 5\' 11"  (1.803 m).   Weight as of this encounter: 57.3 kg.   Intake/Output      03/19 0701 - 03/20 0700 03/20 0701 - 03/21 0700   P.O. 240    Total Intake(mL/kg) 240 (4.2)    Urine (mL/kg/hr) 1350 (1) 275 (0.5)   Stool     Total Output 1350 275   Net -1110 -275          LABS  Results for orders placed or performed during the hospital encounter of 02/17/20 (from the past 24 hour(s))  CBC     Status: Abnormal   Collection Time: 02/22/20  3:35 AM  Result Value Ref Range   WBC 11.3 (H) 4.0 - 10.5 K/uL   RBC 3.41 (L) 4.22 - 5.81 MIL/uL   Hemoglobin 10.2 (L) 13.0 - 17.0 g/dL   HCT 31.3 (L) 39.0 - 52.0 %   MCV 91.8 80.0 - 100.0 fL   MCH 29.9 26.0 - 34.0 pg   MCHC 32.6 30.0 - 36.0 g/dL   RDW 15.3 11.5 - 15.5 %   Platelets 308 150 - 400 K/uL   nRBC 0.0 0.0 - 0.2 %  Basic metabolic panel     Status: Abnormal   Collection Time: 02/22/20  3:35 AM  Result Value Ref Range   Sodium 135 135 - 145 mmol/L   Potassium 4.8 3.5 - 5.1 mmol/L   Chloride 98 98 - 111 mmol/L   CO2 28 22 - 32 mmol/L   Glucose, Bld 105 (H) 70 - 99 mg/dL   BUN 30 (H) 8 - 23 mg/dL   Creatinine, Ser 0.49  (L) 0.61 - 1.24 mg/dL   Calcium 8.4 (L) 8.9 - 10.3 mg/dL   GFR calc non Af Amer >60 >60 mL/min   GFR calc Af Amer >60 >60 mL/min   Anion gap 9 5 - 15  Magnesium     Status: None   Collection Time: 02/22/20  3:35 AM  Result Value Ref Range   Magnesium 1.7 1.7 - 2.4 mg/dL   *Note: Due to a large number of results and/or encounters for the requested time period, some results have not been displayed. A complete set of results can be found in Results Review.     PHYSICAL EXAM:   Gen: sitting up in chair, comfortable  Ext:       Left Lower Extremity   Dressing c/d/i  Ext warm   Minimal swelling   No DCT  DPN, SPN, TN sensation intact  EHL, FHL, AT, PT, peroneals, gastroc motor intact  + DP pulse   Assessment/Plan: 4 Days Post-Op   Principal Problem:   Left displaced femoral neck fracture (HCC) Active Problems:   Hyponatremia   BPH (benign prostatic hyperplasia)   GERD (gastroesophageal reflux disease)   Fall   Physical deconditioning   Hypoalbuminemia   Malignant neoplasm of lower lobe of right lung (HCC)   Atrial fibrillation (HCC)   Hypertension   SIADH (syndrome of inappropriate ADH production) ([place])   Diverticulitis   Pericardial effusion   Thromboembolism ([place])   Hip fracture (HCC)   Anti-infectives (From admission, onward)   Start     Dose/Rate Route Frequency Ordered Stop   02/21/20 1500  amoxicillin-clavulanate (AUGMENTIN) 875-125 MG per tablet 1 tablet     1 tablet Oral 2 times daily 02/21/20 1445     02/18/20 2300  ceFAZolin (ANCEF) IVPB 2g/100 mL premix     2 g 200 mL/hr over 30 Minutes Intravenous Every 6 hours 02/18/20 2036 02/19/20 0446   02/18/20 1300  ceFAZolin (ANCEF) IVPB 2g/100 mL premix     2 g 200 mL/hr over 30 Minutes Intravenous On call to O.R. 02/17/20 2239 02/18/20 1706   02/17/20 2200  demeclocycline (DECLOMYCIN) tablet 300 mg    Note to Pharmacy: Take 2 tablets twice a day     300 mg Oral 2 times daily 02/17/20 1544       .  POD/HD#: 84  79 y/o chronically ill male s/p fall with L femoral neck fracture  -L femoral neck fracture s/p Left hip hemiarthroplasty   WBAT L leg  Posterior hip precautions  PT/OT  Dressing changes as needed  Ice PRN swelling and pain    Ortho issues stable     - Pain management:  Minimal medication usage   - ABL anemia/Hemodynamics  H/H stable  - Medical issues   Complex constellation of medical issues   Per primary team   - DVT/PE prophylaxis:  Eliquis restarted   scds    - Dispo:  SW for SNF   Ortho issues stable   Please contact ortho If there are any further issues  Follow up with Dr. Mardelle Matte in 10 days  Consult placed for [facility] team for SNF    [name], PA-C [phone] (C) 02/22/2020, 3:56 PM  [facility] [place] [address] [phone] Domingo Sep (F)

## 2020-02-22 NOTE — Progress Notes (Signed)
PROGRESS NOTE    Anthony Curry.  WUJ:[id number] DOB: 04-17-1941 DOA: 02/17/2020 PCP: [name], MD  Brief Narrative:78 y.o.malewith history h/oStage IV (T1a, N0, M1b)non small cell RLL lung CA followed by Dr Claudette Stapler A. fib, SIADH, hypothyroidism, depression, anxiety who washospitalized from1/28-2/5/21for perforated diverticulitis and underwent sigmoid colectomy with colostomy creation,  hospital course complicated byCovid 19 pneumonia, urinary retention requiring indwelling foley,ischemic left hand requiringleft brachial embolectomy on 2/1by vascular surgery ( Dr Oneida Alar), on Eliquis since then, presented  to ED with mechanical fall /left hip pain. Patient had complicated hospital course and was discharged to SNF at last discharge but states was not getting enough help there, felt miserable and couldn't stay there long. Left home the following day. He lost 15-20 pounds weight since last admission.ED Course:Afebrile, pulse 78, respiratory rate 18, blood pressure 146/84--171/69, O2 sat 99% on room air. WBC 8.3, hemoglobin 14.2 hematocrit 42.9, platelet 428, sodium 132, potassium 4.3, chloride 96, BUN 18, creatinine 0.68, calcium 9.4, INR 1.1, glucose 96.Hip x-ray showed left femoral neck angulated fracture, CXR with COPD changes, no acute infiltrates.   Orthopedics was consulted and patient was admitted to hospital.    Assessment & Plan:   Principal Problem:   Left displaced femoral neck fracture (HCC) Active Problems:   Hyponatremia   BPH (benign prostatic hyperplasia)   GERD (gastroesophageal reflux disease)   Fall   Physical deconditioning   Hypoalbuminemia   Malignant neoplasm of lower lobe of right lung (HCC)   Atrial fibrillation (HCC)   Hypertension   SIADH (syndrome of inappropriate ADH production) ([place])   Diverticulitis   Pericardial effusion   Thromboembolism (HCC)   Hip fracture (HCC)   Left Femoral neck fracture: Status postleft hip  hemiarthroplasty by orthopedics on 02/18/2020.  PT has seen the patient and patient has been recommended skilled nursing facility placement for rehab.  Mild grade fever.  Postoperative fever/atelectasis..  Chest x-ray done 02/20/2020 showed new retrocardiac opacity which may represent atelectasis versus infiltrate.  Patient does have mild leukocytosis now downtrending..  No fever but mildly elevated procalcitonin.  Afebrile since starting Augmentin.  Continue for now.  Continue to encourage him to use incentive spirometry.   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  Continue ostomy care.  Stage IV non-small cell lung cancer:Patientfollows Dr.Mohammed and receives immunotherapy as outpatient.  Continue mucolytics and bronchodilators.   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  Patient was previously evaluatedby cardiologywith impression of moderate pericardial effusion likely due to lung CA and recommended continuation of anticoagulation. Patient had repeat echocardiogram on 2/3-with no tamponade physiology. Patient has been seen by cardiology this time.  Patient will have to follow-up with cardiology as outpatient after discharge.  Paroxysmal atrial fibrillation: Had rapid ventricular response during hospitalization.  Currently on Eliquis and Cardizem.  Current heart rate at 98 bpm.  Patient was seen by cardiology this admission and signed off on 02/19/2020. Patient went into A. fib RVR again 02/22/2020.  Replete magnesium continue Cardizem given extra dose of Cardizem 30 mg p.o.  Blood pressure is soft will monitor.  Urinary retention:Had foley catheterplaced in last admission and had failed voiding trial on2/4- Continue Flomax,finasteride.   Patient is supposed to follow-up with urology as outpatient for addressing his Foley catheter.  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  On demeclocycline.   Will continue.  Sodium of 135  Hypothyroidism: Continue Synthroid  Hypomagnesemia.    Magnesium 1.7 replete.    Borderline  hyperkalemia.  Potassium down to 4.8 from 5.3 without any intervention.  History of COPD/Emphysema/recent COVID-19 pneumonia: Chest x-ray with new retrocardiac density.  With mild grade fever and leukocytosis will start on Augmentin.  On nasal cannula oxygen at 2 L/min.  GERD: Continue PPI  GeneralizedDeconditioning/debilitywith protein calorie malnutrition:Patient was discharged to skilled nursing facility in the last admission but decided to go home after that. PT recommends discharge to SNF this admission.[name] He will need a Covid test done prior to discharge please order this close to the time of discharge.   Estimated body mass index is 17.62 kg/m as calculated from the following:   Height as of this encounter: 5\' 11"  (1.803 m).   Weight as of this encounter: 57.3 kg.  DVT prophylaxis: Apixaban  code Status: Full code  family Communication: None Disposition Plan: Patient came from home plan is to discharge him to skilled nursing facility.  Barriers to discharge awaiting placement  Consultants:   Orthopedics and cardiology  Procedures: Left hip hemiarthroplasty 02/18/2020  Antimicrobials: Augmentin, demeclocycline  Subjective: He is resting in bed he is awake and alert he denies any complaints when I saw him no chest pain shortness of breath. Nursing staff informed me later that patient went into A. fib RVR sometime in the middle of the night with his heart rate increasing to 140s. At that time patient complained of shortness of breath  Objective: Vitals:   02/22/20 0938 02/22/20 0944 02/22/20 0948 02/22/20 0956  BP:   129/65   Pulse:   (!) 59   Resp: 17 18 18  (!) 25  Temp:   98.2 F (36.8 C)   TempSrc:   Oral   SpO2:   95%   Weight:      Height:        Intake/Output Summary (Last 24 hours) at 02/22/2020 1003 Last data filed at  02/22/2020 0609 Gross per 24 hour  Intake --  Output 1350 ml  Net -1350 ml   Filed Weights   02/17/20 1341 02/17/20 2048  Weight: 59 kg 57.3 kg    Examination:  General exam: Appears calm and comfortable  Respiratory system: Coarse to auscultation. Respiratory effort normal. Cardiovascular system: S1 & S2 heard, RRR. No JVD, murmurs, rubs, gallops or clicks. No pedal edema. Gastrointestinal system: Abdomen is nondistended, soft and nontender. No organomegaly or masses felt. Normal bowel sounds heard.  Colostomy draining liquid stool Central nervous system: Awake and alert  extremities: Left hip incision covered with dressing clean dry intact Skin: No rashes, lesions or ulcers Psychiatry: Judgement and insight appear normal. Mood & affect appropriate.     Data Reviewed: I have personally reviewed following labs and imaging studies  CBC: Recent Labs  Lab 02/17/20 1407 02/17/20 1407 02/18/20 0734 02/18/20 0734 02/18/20 1856 02/19/20 0314 02/20/20 0415 02/21/20 0409 02/22/20 0335  WBC 8.3   < > 11.3*  --   --  20.2* 17.1* 13.1* 11.3*  NEUTROABS 6.1  --   --   --   --   --   --   --   --   HGB 14.2   < > 12.6*   < > 9.9* 10.7* 10.6* 10.2* 10.2*  HCT 42.9   < > 37.0*   < > 29.0* 32.7* 31.4* 30.8* 31.3*  MCV 89.7   < > 88.5  --   --  91.3 89.2 92.2 91.8  PLT 428*   < > 379  --   --  282 266 270  308   < > = values in this interval not displayed.   Basic Metabolic Panel: Recent Labs  Lab 02/18/20 0734 02/18/20 0734 02/18/20 1856 02/19/20 0314 02/20/20 0415 02/21/20 0409 02/22/20 0335  NA 131*   < > 132* 131* 132* 136 135  K 3.7   < > 4.0 3.9 4.1 5.3* 4.8  CL 98   < > 97* 101 102 104 98  CO2 25  --   --  22 25 28 28   GLUCOSE 111*   < > 111* 115* 152* 117* 105*  BUN 16   < > 14 16 22  29* 30*  CREATININE 0.65   < > 0.70 0.74 0.50* 0.58* 0.49*  CALCIUM 8.7*  --   --  8.1* 8.3* 8.8* 8.4*  MG  --   --   --  1.4* 1.8 1.9 1.7   < > = values in this interval not  displayed.   GFR: Estimated Creatinine Clearance: 59.7 mL/min (A) (by C-G formula based on SCr of 0.49 mg/dL (L)). Liver Function Tests: Recent Labs  Lab 02/17/20 1407  AST 27  ALT 24  ALKPHOS 123  BILITOT 0.6  PROT 6.5  ALBUMIN 3.7   No results for input(s): LIPASE, AMYLASE in the last 168 hours. No results for input(s): AMMONIA in the last 168 hours. Coagulation Profile: Recent Labs  Lab 02/17/20 1407  INR 1.1   Cardiac Enzymes: No results for input(s): CKTOTAL, CKMB, CKMBINDEX, TROPONINI in the last 168 hours. BNP (last 3 results) No results for input(s): PROBNP in the last 8760 hours. HbA1C: No results for input(s): HGBA1C in the last 72 hours. CBG: No results for input(s): GLUCAP in the last 168 hours. Lipid Profile: No results for input(s): CHOL, HDL, LDLCALC, TRIG, CHOLHDL, LDLDIRECT in the last 72 hours. Thyroid Function Tests: No results for input(s): TSH, T4TOTAL, FREET4, T3FREE, THYROIDAB in the last 72 hours. Anemia Panel: No results for input(s): VITAMINB12, FOLATE, FERRITIN, TIBC, IRON, RETICCTPCT in the last 72 hours. Sepsis Labs: Recent Labs  Lab 02/20/20 0415 02/21/20 0409  PROCALCITON 1.88 0.97    Recent Results (from the past 240 hour(s))  Surgical pcr screen     Status: Abnormal   Collection Time: 02/17/20 10:52 PM   Specimen: Nasal Mucosa; Nasal Swab  Result Value Ref Range Status   MRSA, PCR NEGATIVE NEGATIVE Final   Staphylococcus aureus POSITIVE (A) NEGATIVE Final    Comment: (NOTE) The Xpert SA Assay (FDA approved for NASAL specimens in patients 22 years of age and older), is one component of a comprehensive surveillance program. It is not intended to diagnose infection nor to guide or monitor treatment. Performed at [facility], [place] [address], [place], [place] [address]          Radiology Studies: No results found.      Scheduled Meds: . acidophilus  1 capsule Oral Daily  . alum & mag  hydroxide-simeth  30 mL Oral Once  . amoxicillin-clavulanate  1 tablet Oral BID  . apixaban  5 mg Oral BID  . Chlorhexidine Gluconate Cloth  6 each Topical Daily  . demeclocycline  300 mg Oral BID  . diltiazem  180 mg Oral Q1200  . docusate sodium  100 mg Oral BID  . ferrous sulfate  325 mg Oral TID PC  . fluticasone  2 spray Each Nare QHS  . guaiFENesin  600 mg Oral BID  . levothyroxine  50 mcg Oral QAC breakfast  . magnesium oxide  400 mg Oral Daily  . mirtazapine  30 mg Oral QHS  . multivitamin with minerals  1 tablet Oral Daily  . omega-3 acid ethyl esters  1 g Oral Daily  . pantoprazole  40 mg Oral Daily  . senna  1 tablet Oral BID  . tamsulosin  0.4 mg Oral BID  . thiamine  100 mg Oral Daily   Continuous Infusions: . sodium chloride       LOS: 5 days     Georgette Shell, MD  02/22/2020, 10:03 AM

## 2020-02-23 LAB — CBC
HCT: 32.8 % — ABNORMAL LOW (ref 39.0–52.0)
Hemoglobin: 10.7 g/dL — ABNORMAL LOW (ref 13.0–17.0)
MCH: 30.6 pg (ref 26.0–34.0)
MCHC: 32.6 g/dL (ref 30.0–36.0)
MCV: 93.7 fL (ref 80.0–100.0)
Platelets: 347 10*3/uL (ref 150–400)
RBC: 3.5 MIL/uL — ABNORMAL LOW (ref 4.22–5.81)
RDW: 15.2 % (ref 11.5–15.5)
WBC: 12 10*3/uL — ABNORMAL HIGH (ref 4.0–10.5)
nRBC: 0 % (ref 0.0–0.2)

## 2020-02-23 MED ORDER — SODIUM CHLORIDE 0.9 % IV SOLN: INTRAVENOUS | Status: DC

## 2020-02-23 NOTE — Progress Notes (Addendum)
PROGRESS NOTE    Anthony Curry.  EUM:[id number] DOB: 04-01-1941 DOA: 02/17/2020 PCP: [name], MD   Brief Narrative: 79 y.o.malewith history h/oStage IV (T1a, N0, M1b)non small cell RLL lung CA followed by Dr Claudette Stapler A. fib, SIADH, hypothyroidism, depression, anxiety who washospitalized from1/28-2/5/21for perforated diverticulitis and underwent sigmoid colectomy with colostomy creation, hospital course complicated byCovid 19 pneumonia, urinary retention requiring indwelling foley,ischemic left hand requiringleft brachial embolectomy on 2/1by vascular surgery ( Dr Oneida Alar), on Eliquis since then, presented to ED with mechanical fall /left hip pain. Patient had complicated hospital course and was discharged to SNF at last discharge but states was not getting enough help there, felt miserable and couldn't stay there long. Left home the following day. He lost 15-20 pounds weight since last admission.ED Course:Afebrile, pulse 78, respiratory rate 18, blood pressure 146/84--171/69, O2 sat 99% on room air. WBC 8.3, hemoglobin 14.2 hematocrit 42.9, platelet 428, sodium 132, potassium 4.3, chloride 96, BUN 18, creatinine 0.68, calcium 9.4, INR 1.1, glucose 96.Hip x-ray showed left femoral neck angulated fracture, CXR with COPD changes, no acute infiltrates. Orthopedics was consulted and patient was admitted to hospital.   Assessment & Plan:   Principal Problem:   Left displaced femoral neck fracture (HCC) Active Problems:   Hyponatremia   BPH (benign prostatic hyperplasia)   GERD (gastroesophageal reflux disease)   Fall   Physical deconditioning   Hypoalbuminemia   Malignant neoplasm of lower lobe of right lung (HCC)   Atrial fibrillation (HCC)   Hypertension   SIADH (syndrome of inappropriate ADH production) ([place])   Diverticulitis   Pericardial effusion   Thromboembolism (HCC)   Hip fracture (HCC)  Left Femoral neck fracture: Status postleft hip  hemiarthroplasty by orthopedics on 02/18/2020. PT has seen the patientand patient has been recommended skilled nursing facility placement for rehab.  Mild grade fever.Postoperative fever/atelectasis..  Chest x-ray done 02/20/2020 showed new retrocardiac opacity which may represent atelectasis versus infiltrate. chest xray 3/20-Persistent left basilar consolidation which may reflect atelectasis or infiltrate. Bandlike opacities in the right lung base likely reflecting atelectasis. Still with leukocytosis 12.0 temperature curve trending down since starting Augmentin.  Encourage him to use incentive spirometry.  Continue breathing treatments.   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 Continue ostomy care.  Stage IV non-small cell lung cancer:Patientfollows Dr.Mohammed and receives immunotherapy as outpatient.Continue mucolytics and bronchodilators.   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 Patient was previously evaluatedby cardiologywith impression of moderate pericardial effusion likely due to lung CA and recommended continuation of anticoagulation. Patient had repeat echocardiogram on 2/3-with no tamponade physiology. Patient has been seen by cardiology this time. Patient will have to follow-up with cardiology as outpatient after discharge.  Cardiology signed off.  Paroxysmal atrial fibrillation: Had rapid ventricular responseduring hospitalization.Currently on Eliquis and Cardizem. Current heart rate at 98 bpm. Patient was seen by cardiology this admission and signed off on 02/19/2020. Patient went into A. fib RVR again 02/22/2020.  Repleted magnesium.  Continue Cardizem.   Urinary retention:Had foley catheterplaced in last admission and had failed voiding trial on2/4- Continue Flomax,finasteride.Patient is supposed to follow-up with urology as outpatient for addressing his Foley  catheter.  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 On demeclocycline. Will continue. Sodium of 135  Hypothyroidism: Continue Synthroid  Hypomagnesemia.  Magnesium 1.7 replete.    Borderline hyperkalemia.  Potassium down to 4.8 from 5.3 without any intervention.  History of COPD/Emphysema/recent COVID-19 pneumonia:Chest x-ray with new retrocardiac density. With mild  grade fever and leukocytosis will start on Augmentin. On nasal cannula oxygen at 2 L/min.  GERD: Continue PPI  GeneralizedDeconditioning/debilitywith protein calorie malnutrition:Patient was discharged to skilled nursing facility in the last admission but decided to go home after that. PT recommends discharge to SNF this admission.[name] He will need a Covid test done prior to discharge please order this close to the time of discharge.  Estimated body mass index is 17.62 kg/m as calculated from the following:   Height as of this encounter: 5\' 11"  (1.803 m).   Weight as of this encounter: 57.3 kg.  DVT prophylaxis: Apixaban  code Status: Full code  family Communication:  Discussed with daughter Disposition Plan: Patient came from home plan is to discharge him to skilled nursing facility.  Barriers to discharge -patient with new diagnosis of pneumonia, daughter very concerned that he was discharged from the hospital too early last time and was taken to rehab at [facility].  Apparently [place] told her that he will not get therapy 5 times a week.    Consultants:   Orthopedics and cardiology  Procedures: Left hip hemiarthroplasty 02/18/2020  Antimicrobials: Augmentin, demeclocycline   Subjective: Coughing sounds wet Resting in bed in nad No chest pain Yesterday had periods of rapid afib likely due to pneumonia On po cardizem   Objective: Vitals:   02/22/20 1740 02/22/20 1832 02/22/20 2118 02/23/20 0654  BP: 127/65  (!) 117/58 (!) 115/55  Pulse: (!) 123  (!) 111 (!)  59  Resp: 18 (!) 25 18 18   Temp: 98 F (36.7 C)  99.3 F (37.4 C) 98.8 F (37.1 C)  TempSrc: Oral  Oral   SpO2: 99%  92% 90%  Weight:      Height:        Intake/Output Summary (Last 24 hours) at 02/23/2020 1032 Last data filed at 02/23/2020 0700 Gross per 24 hour  Intake --  Output 1225 ml  Net -1225 ml   Filed Weights   02/17/20 1341 02/17/20 2048  Weight: 59 kg 57.3 kg    Examination:  General exam: Appears calm and comfortable  Respiratory system: Rhonchi scattered to auscultation. Respiratory effort normal. Cardiovascular system: S1 & S2 heard, RRR. No JVD, murmurs, rubs, gallops or clicks. No pedal edema. Gastrointestinal system: Abdomen is nondistended, soft and nontender. No organomegaly or masses felt. Normal bowel sounds heard.  Colostomy with liquid stool Central nervous system: Alert and oriented. No focal neurological deficits. Extremities: 1+ edema to the left lower extremity Skin: No rashes, lesions or ulcers Psychiatry: Judgement and insight appear normal. Mood & affect appropriate.     Data Reviewed: I have personally reviewed following labs and imaging studies  CBC: Recent Labs  Lab 02/17/20 1407 02/18/20 0734 02/19/20 0314 02/20/20 0415 02/21/20 0409 02/22/20 0335 02/23/20 0345  WBC 8.3   < > 20.2* 17.1* 13.1* 11.3* 12.0*  NEUTROABS 6.1  --   --   --   --   --   --   HGB 14.2   < > 10.7* 10.6* 10.2* 10.2* 10.7*  HCT 42.9   < > 32.7* 31.4* 30.8* 31.3* 32.8*  MCV 89.7   < > 91.3 89.2 92.2 91.8 93.7  PLT 428*   < > 282 266 270 308 347   < > = values in this interval not displayed.   Basic Metabolic Panel: Recent Labs  Lab 02/18/20 0734 02/18/20 0734 02/18/20 1856 02/19/20 0314 02/20/20 0415 02/21/20 0409 02/22/20 0335  NA 131*   < >  132* 131* 132* 136 135  K 3.7   < > 4.0 3.9 4.1 5.3* 4.8  CL 98   < > 97* 101 102 104 98  CO2 25  --   --  22 25 28 28   GLUCOSE 111*   < > 111* 115* 152* 117* 105*  BUN 16   < > 14 16 22  29* 30*   CREATININE 0.65   < > 0.70 0.74 0.50* 0.58* 0.49*  CALCIUM 8.7*  --   --  8.1* 8.3* 8.8* 8.4*  MG  --   --   --  1.4* 1.8 1.9 1.7   < > = values in this interval not displayed.   GFR: Estimated Creatinine Clearance: 59.7 mL/min (A) (by C-G formula based on SCr of 0.49 mg/dL (L)). Liver Function Tests: Recent Labs  Lab 02/17/20 1407  AST 27  ALT 24  ALKPHOS 123  BILITOT 0.6  PROT 6.5  ALBUMIN 3.7   No results for input(s): LIPASE, AMYLASE in the last 168 hours. No results for input(s): AMMONIA in the last 168 hours. Coagulation Profile: Recent Labs  Lab 02/17/20 1407  INR 1.1   Cardiac Enzymes: No results for input(s): CKTOTAL, CKMB, CKMBINDEX, TROPONINI in the last 168 hours. BNP (last 3 results) No results for input(s): PROBNP in the last 8760 hours. HbA1C: No results for input(s): HGBA1C in the last 72 hours. CBG: No results for input(s): GLUCAP in the last 168 hours. Lipid Profile: No results for input(s): CHOL, HDL, LDLCALC, TRIG, CHOLHDL, LDLDIRECT in the last 72 hours. Thyroid Function Tests: No results for input(s): TSH, T4TOTAL, FREET4, T3FREE, THYROIDAB in the last 72 hours. Anemia Panel: No results for input(s): VITAMINB12, FOLATE, FERRITIN, TIBC, IRON, RETICCTPCT in the last 72 hours. Sepsis Labs: Recent Labs  Lab 02/20/20 0415 02/21/20 0409  PROCALCITON 1.88 0.97    Recent Results (from the past 240 hour(s))  Surgical pcr screen     Status: Abnormal   Collection Time: 02/17/20 10:52 PM   Specimen: Nasal Mucosa; Nasal Swab  Result Value Ref Range Status   MRSA, PCR NEGATIVE NEGATIVE Final   Staphylococcus aureus POSITIVE (A) NEGATIVE Final    Comment: (NOTE) The Xpert SA Assay (FDA approved for NASAL specimens in patients 10 years of age and older), is one component of a comprehensive surveillance program. It is not intended to diagnose infection nor to guide or monitor treatment. Performed at [facility] [place] [address] [place], [place] [address]          Radiology Studies: DG Chest 1 View  Result Date: 02/22/2020 CLINICAL DATA:  79 y/o pt with recent hip surgery (3/16), hx of covid, HTN, left upper lobe cancer, pericardial effusion, a-fib and pulmonary nodules. EXAM: CHEST  1 VIEW COMPARISON:  Chest radiograph 02/20/2020 FINDINGS: Stable cardiomediastinal contours. Lungs are hyperinflated. Bandlike opacities at the right lung base likely reflect atelectasis. Persistent left basilar consolidation which may reflect atelectasis or infiltrate. No pneumothorax or large pleural effusion. Multiple bilateral old rib fractures. IMPRESSION: Persistent left basilar consolidation which may reflect atelectasis or infiltrate. Bandlike opacities in the right lung base likely reflecting atelectasis. Electronically Signed   By: Audie Pinto M.D.   On: 02/22/2020 17:13        Scheduled Meds: . acidophilus  1 capsule Oral Daily  . alum & mag hydroxide-simeth  30 mL Oral Once  . amoxicillin-clavulanate  1 tablet Oral BID  . apixaban  5 mg Oral BID  .  Chlorhexidine Gluconate Cloth  6 each Topical Daily  . demeclocycline  300 mg Oral BID  . diltiazem  180 mg Oral Q1200  . docusate sodium  100 mg Oral BID  . ferrous sulfate  325 mg Oral TID PC  . fluticasone  2 spray Each Nare QHS  . guaiFENesin  600 mg Oral BID  . levothyroxine  50 mcg Oral QAC breakfast  . magnesium oxide  400 mg Oral Daily  . mirtazapine  30 mg Oral QHS  . multivitamin with minerals  1 tablet Oral Daily  . omega-3 acid ethyl esters  1 g Oral Daily  . pantoprazole  40 mg Oral Daily  . senna  1 tablet Oral BID  . tamsulosin  0.4 mg Oral BID  . thiamine  100 mg Oral Daily   Continuous Infusions: . sodium chloride       LOS: 6 days     Georgette Shell, MD  02/23/2020, 10:32 AM

## 2020-02-23 NOTE — Progress Notes (Deleted)
Cardiology Office Note:   Date:  02/23/2020  NAME:  Anthony Curry.    MRN: [id number] DOB:  12-10-1940   PCP:  [name], MD  Cardiologist:  Evalina Field, MD  Electrophysiologist:  None   Referring MD: [name], MD   No chief complaint on file. ***  History of Present Illness:   Anthony Cammarata. is a 79 y.o. male with a hx of lung CA, pAF on eliquis, LUE arterial thromboembolic event, pericardial effusion, HTN, anemia  who is being seen today for the evaluation of pericardial effusion at the request of [name], MD. Several hospitalizations recently. Covid, hip fracture, L arm arterial thrombus.   Problem List 1. Stage IV lung CA 2. Paroxysmal Afib on eliquis  3. Alcoholism in remission  4. Anemia 5. HTN 6. Pericardial effusion  -moderate 12/2019 -> no tamponade clinically  7. L femoral neck fracture s/p repair 8. 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 Adrenal mass ([place])   . Alcoholism in remission ([place])   . Anemia of chronic disease   . 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 Anxiety   . BPH (benign prostatic hyperplasia)   . Cancer ([place])    small cell/lung  . Complication of anesthesia 09/08/2015    JITTERY AFTER GALLBLADDER SURGERY  . GERD (gastroesophageal reflux disease)   . History of radiation therapy 11/14/16, 11/18/16, 11/22/16   Left upper lung: 54 Gy in 3 fractions  . Hypertension   . Neoplastic malignant related fatigue    IV tx. every 2 weeks last9-14-16 (Dr. Earlie Server), radiation last tx. 6 months  . 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 Pericardial effusion   . Persistent atrial fibrillation ([place])   . Pulmonary nodules   . Radiation 12/02/14-12/16/14   Left adrenal gland 30 Gy in 10 fractions    Past Surgical History: Past Surgical History:  Procedure Laterality Date  . ANKLE  ARTHRODESIS  1995   rt fx-hardware in  . CARDIOVERSION N/A 09/29/2014   Procedure: CARDIOVERSION;  Surgeon: Josue Hector, MD;  Location[facility] ENDOSCOPY;  Service: Cardiovascular;  Laterality: N/A;  . CHOLECYSTECTOMY  09/08/2015    laproscopic   . CHOLECYSTECTOMY N/A 09/08/2015   Procedure: LAPAROSCOPIC CHOLECYSTECTOMY WITH ATTEMPTED INTRAOPERATIVE CHOLANGIOGRAM ;  Surgeon: Fanny Skates, MD;  Location: [facility];  Service: General;  Laterality: N/A;  . COLONOSCOPY    . EMBOLECTOMY Left 01/06/2020   Procedure: Embolectomy left axillary, brachial, radial, and ulnar artery;  Surgeon: Elam Dutch, MD;  Location: [facility] OR;  Service: Vascular;  Laterality: Left;  . ERCP N/A 09/02/2015   Procedure: ENDOSCOPIC RETROGRADE CHOLANGIOPANCREATOGRAPHY (ERCP);  Surgeon: Arta Silence, MD;  Location: [facility] ENDOSCOPY;  Service: Endoscopy;  Laterality: N/A;  . ERCP N/A 09/07/2015   Procedure: ENDOSCOPIC RETROGRADE CHOLANGIOPANCREATOGRAPHY (ERCP);  Surgeon: Clarene Essex, MD;  Location: [facility] ENDOSCOPY;  Service: Endoscopy;  Laterality: N/A;  . EXCISION / CURETTAGE BONE CYST FINGER  2005   rt thumb  . HEMORRHOID SURGERY    . HIP ARTHROPLASTY Left 02/18/2020   Procedure: LEFT HIP HEMIARTHROPLASTY;  Surgeon: Marchia Bond, MD;  Location: [facility] ORS;  Service: Orthopedics;  Laterality: Left;  . LAPAROTOMY N/A 01/05/2020   Procedure: EXPLORATORY LAPAROTOMY sigmoid colon resection creation end colostomy for perforated diverticulitis and purulent peritonitis;  Surgeon: Kieth Brightly Arta Bruce, MD;  Location: [facility] ORS;  Service: General;  Laterality:  N/A;  . Percutaneous Cholecytostomy tube     IVR insertion 06-13-15(Dr. Vernard Gambles)- 08-28-15 remains in place to drainage bag  . TONSILLECTOMY    . TOTAL SHOULDER ARTHROPLASTY  02/03/2012   Procedure: TOTAL SHOULDER ARTHROPLASTY;  Surgeon: [name], MD;  Location: [facility];  Service: Orthopedics;  Laterality: Left;    Current Medications: No outpatient medications have  been marked as taking for the 02/26/20 encounter (Appointment) with O'Neal, Cassie Freer, MD.     Allergies:    Patient has no known allergies.   Social History: Social History   Socioeconomic History  . Marital status: Married    Spouse name: Anthony Curry  . Number of children: 3  . Years of education: 33  . Highest education level: Not on file  Occupational History  . 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 Smoking status: Former Smoker    Packs/day: 2.00    Years: 30.00    Pack years: 60.00    Types: Cigarettes    Quit date: 12/05/1990    Years since quitting: 29.2  . Smokeless tobacco: Never Used  Substance and Sexual Activity  . Alcohol use: Yes    Alcohol/week: 0.0 standard drinks    Comment: past hx.ETOH abuse,08-31-15 "occ. wine ,drinks non alcoholic beer occ."  . Drug use: No  . Sexual activity: Not Currently  Other Topics Concern  . Not on file  Social History Narrative   Lives at home with wife.   Caffeine use: Drinks coffee- 2 cups per day   Retired from [employer] Strain:   . Difficulty of Paying Living Expenses:   Food Insecurity:   . Worried About Charity fundraiser in the Last Year:   . Arboriculturist in the Last Year:   Transportation Needs:   . Film/video editor (Medical):   [name] Lack of Transportation (Non-Medical):   Physical Activity:   . Days of Exercise per Week:   . Minutes of Exercise per Session:   Stress:   . Feeling of Stress :   Social Connections:   . Frequency of Communication with Friends and Family:   . Frequency of Social Gatherings with Friends and Family:   . Attends Religious Services:   . Active Member of Clubs or Organizations:   . Attends Archivist Meetings:   [name] Marital Status:      Family History: The patient's ***family history includes Brain cancer in his brother; Diabetes in his paternal  grandmother; Heart attack in his brother and father; Hypertension in his father; Lung cancer in his mother. There is no history of Neuropathy.  ROS:   All other ROS reviewed and negative. Pertinent positives noted in the HPI.     EKGs/Labs/Other Studies Reviewed:   The following studies were personally reviewed by me today:  EKG:  EKG is *** ordered today.  The ekg ordered today demonstrates ***, and was personally reviewed by me.   TTE 01/08/2020 1. Left ventricular ejection fraction, by visual estimation, is 55 to  60%.  2. Moderate to large pericardial effusion. No significant change since  the last exam 01/02/20 on side by side comparison. No RV diastolic  collapse. Suboptimal Doppler alignment of mitral inflow, no definite  respiratory variability. Hypertensive and  normal heart rate. These findings suggest no evidence of tamponade  physiology. Hepatic vein Doppler  not performed. IVC not visualized.  Borderline respiratory related leftward ventricular septal shift.   Recent Labs: 01/05/2020: B Natriuretic Peptide 109.6 01/27/2020: TSH 4.293 02/17/2020: ALT 24 02/22/2020: BUN 30; Creatinine, Ser 0.49; Magnesium 1.7; Potassium 4.8; Sodium 135 02/23/2020: Hemoglobin 10.7; Platelets 347   Recent Lipid Panel    Component Value Date/Time   CHOL  05/30/2007 0400    158        ATP III CLASSIFICATION:  <200     mg/dL   Desirable  200-239  mg/dL   Borderline High  >=240    mg/dL   High   TRIG 40 05/30/2007 0400   HDL 55 05/30/2007 0400   CHOLHDL 2.9 05/30/2007 0400   VLDL 8 05/30/2007 0400   LDLCALC  05/30/2007 0400    95        Total Cholesterol/HDL:CHD Risk Coronary Heart Disease Risk Table                     Men   Women  1/2 Average Risk   3.4   3.3    Physical Exam:   VS:  There were no vitals taken for this visit.   Wt Readings from Last 3 Encounters:  02/17/20 126 lb 5.2 oz (57.3 kg)  02/06/20 134 lb (60.8 kg)  01/30/20 134 lb 6.4 oz (61 kg)    General: Well  nourished, well developed, in no acute distress Heart: Atraumatic, normal size  Eyes: PEERLA, EOMI  Neck: Supple, no JVD Endocrine: No thryomegaly Cardiac: Normal S1, S2; RRR; no murmurs, rubs, or gallops Lungs: Clear to auscultation bilaterally, no wheezing, rhonchi or rales  Abd: Soft, nontender, no hepatomegaly  Ext: No edema, pulses 2+ Musculoskeletal: No deformities, BUE and BLE strength normal and equal Skin: Warm and dry, no rashes   Neuro: Alert and oriented to person, place, time, and situation, CNII-XII grossly intact, no focal deficits  Psych: Normal mood and affect   ASSESSMENT:   Jontavius Rabalais. is a 79 y.o. male who presents for the following: No diagnosis found.  PLAN:   There are no diagnoses linked to this encounter.  Disposition: No follow-ups on file.  Medication Adjustments/Labs and Tests Ordered: Current medicines are reviewed at length with the patient today.  Concerns regarding medicines are outlined above.  No orders of the defined types were placed in this encounter.  No orders of the defined types were placed in this encounter.   There are no Patient Instructions on file for this visit.   Time Spent with Patient: I have spent a total of *** minutes with patient reviewing hospital notes, telemetry, EKGs, labs and examining the patient as well as establishing an assessment and plan that was discussed with the patient.  > 50% of time was spent in direct patient care.  Signed, Addison Naegeli. Audie Box, [place]  [address] [place], [place] [address] [phone]  02/23/2020 4:06 PM

## 2020-02-24 ENCOUNTER — Ambulatory Visit: Payer: Medicare Other

## 2020-02-24 ENCOUNTER — Ambulatory Visit: Payer: Medicare Other | Admitting: Physician Assistant

## 2020-02-24 ENCOUNTER — Inpatient Hospital Stay (HOSPITAL_COMMUNITY): Payer: Medicare Other

## 2020-02-24 ENCOUNTER — Other Ambulatory Visit: Payer: Medicare Other

## 2020-02-24 LAB — CBC
HCT: 30.5 % — ABNORMAL LOW (ref 39.0–52.0)
Hemoglobin: 9.9 g/dL — ABNORMAL LOW (ref 13.0–17.0)
MCH: 30.3 pg (ref 26.0–34.0)
MCHC: 32.5 g/dL (ref 30.0–36.0)
MCV: 93.3 fL (ref 80.0–100.0)
Platelets: 332 10*3/uL (ref 150–400)
RBC: 3.27 MIL/uL — ABNORMAL LOW (ref 4.22–5.81)
RDW: 15.3 % (ref 11.5–15.5)
WBC: 8.9 10*3/uL (ref 4.0–10.5)
nRBC: 0 % (ref 0.0–0.2)

## 2020-02-24 LAB — BASIC METABOLIC PANEL
Anion gap: 6 (ref 5–15)
BUN: 24 mg/dL — ABNORMAL HIGH (ref 8–23)
CO2: 28 mmol/L (ref 22–32)
Calcium: 8.1 mg/dL — ABNORMAL LOW (ref 8.9–10.3)
Chloride: 98 mmol/L (ref 98–111)
Creatinine, Ser: 0.61 mg/dL (ref 0.61–1.24)
GFR calc Af Amer: >60 mL/min (ref >60)
GFR calc non Af Amer: >60 mL/min (ref >60)
Glucose, Bld: 96 mg/dL (ref 70–99)
Potassium: 4.7 mmol/L (ref 3.5–5.1)
Sodium: 132 mmol/L — ABNORMAL LOW (ref 135–145)

## 2020-02-24 LAB — MAGNESIUM: Magnesium: 1.6 mg/dL — ABNORMAL LOW (ref 1.7–2.4)

## 2020-02-24 MED ORDER — MAGNESIUM SULFATE 2 GM/50ML IV SOLN
2.0000 g | Freq: Once | INTRAVENOUS | Status: AC
  Administered 2020-02-24: 2 g via INTRAVENOUS
  Filled 2020-02-24: qty 50

## 2020-02-24 MED ORDER — HYDROCODONE-ACETAMINOPHEN 10-325 MG PO TABS
1.0000 | ORAL_TABLET | ORAL | Status: DC | PRN
  Administered 2020-02-24: 1 via ORAL
  Filled 2020-02-24: qty 2

## 2020-02-24 NOTE — TOC Progression Note (Signed)
Transition of Care (TOC) - Progression Note    Patient Details  Name: Lionardo Haze. MRN: [id number] Date of Birth: August 21, 1941  Transition of Care [facility]) CM/SW Contact  Purcell Mouton, RN Phone Number: 02/24/2020, 4:22 PM  Clinical Narrative:    Will fax pt out to SNF/MD.    Expected Discharge Plan: [place] Barriers to Discharge: No Barriers Identified  Expected Discharge Plan and Services Expected Discharge Plan: [place]   Discharge Planning Services: CM Consult   Living arrangements for the past 2 months: Single Family Home                                       Social Determinants of Health (SDOH) Interventions    Readmission Risk Interventions Readmission Risk Prevention Plan 01/06/2020  Transportation Screening Complete  Medication Review Press photographer) Complete  Some recent data might be hidden

## 2020-02-24 NOTE — Care Management Important Message (Signed)
Important Message  Patient Details IM Letter given to [name] RN Case Manager to present to the Patient Name: Anthony Curry. MRN: [id number] Date of Birth: 02/11/1941   Medicare Important Message Given:  Yes     Kerin Salen 02/24/2020, 12:47 PM

## 2020-02-24 NOTE — Progress Notes (Signed)
PROGRESS NOTE  Anthony Curry.  DOB: 1941-08-08  PCP: [name], MD ZMO:[id number]  DOA: 02/17/2020 Admitted From: Home  LOS: 7 days   Chief Complaint  Patient presents with  . Fall   Brief narrative: Patient is a 79 y.o.malewith history h/oStage IV (T1a, N0, M1b)non small cell RLL lung CA followed by Dr Claudette Stapler A. fib, SIADH, hypothyroidism, depression, anxiety.  Patient was recentlyhospitalized from1/28-2/5/21for perforated diverticulitis and underwent sigmoid colectomy with colostomy creation, hospital course complicated byCovid 19 pneumonia, urinary retention requiring indwelling foley,ischemic left hand requiringleft brachial embolectomy on 2/1by vascular surgery ( Dr Oneida Alar), on Eliquis since then. He was discharged to SNF but states was not getting enough help there, felt miserable and couldn't stay there land left home the following day. He lost 15-20 pounds weight since last admission. On 3/15, he had a mechanical fall leading to left hip pain and has presented to the ED.  In the ED, patient was afebrileand hemodynamically stable.   Hip x-ray showed left femoral neck angulated fracture, Orthopedics was consulted and patient was admitted to hospital.  Subjective: Patient was seen and examined this morning.  Sitting up at the edge of the bed.  Was working with physical therapy.  Had congested cough.  Tachycardic to 130s while getting up from bed with assistance.  Assessment/Plan: Left Femoral neck fracture -s/pleft hip hemiarthroplasty by orthopedics on 02/18/2020.  -PT has seen the patientand patient has been recommended skilled nursing facility placement for rehab.  Aspiration pneumonia -Postoperatively, patient had fever, atelectasis. -Chest x-ray 3/18 - new retrocardiac opacity- atelectasis versus infiltrate. -Chest xray 3/20 - persistent left basilar consolidation -Augmentin was started with clinical improvement.  Continues to  have productive cough.  -Mother repeat chest x-ray today 3/22 shows slightly increased atelectasis and/or consolidation at the left lung base. Continue breathing treatment.  Continue Mucinex.    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 Continue ostomy care.  Stage IV non-small cell lung cancer:Patientfollows Dr.Mohammed and receives immunotherapy as outpatient.Continue mucolytics and bronchodilators.   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 Patient was previously evaluatedby cardiologywith impression of moderate pericardial effusion likely due to lung CA and recommended continuation of anticoagulation. Patient had repeat echocardiogram on 2/3-with no tamponade physiology. Patient was seen by cardiology this time. Patient will have to follow-up with cardiology as outpatient after discharge. Signed off for now.  Paroxysmal atrial fibrillation: Had rapid ventricular responseduring hospitalization. -Cardiology consultation obtained.  Rate control achieved. Currently on Eliquis and Cardizem.   Urinary retention:Had foley catheterplaced in last admission and had failed voiding trial on2/4- Continue Flomax,finasteride.Patient is supposed to follow-up with urology as outpatient for addressing his Foley catheter.  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 On demeclocycline. Will continue. Sodium of 135  Hypothyroidism: Continue Synthroid  Hypomagnesemia.Magnesium 1.6 despite 100 mg daily of magnesium replacement. Ordered for IV replacement.  Borderline hyperkalemia -Potassium down to 4.8 from 5.3 without any intervention.  History of COPD/Emphysema/recent COVID-19 pneumonia:Chest x-ray with new retrocardiac density. With mild grade fever and leukocytosis will start on Augmentin. On nasal cannula oxygen at 2 L/min.  GERD: Continue  PPI  GeneralizedDeconditioning/debilitywith protein calorie malnutrition:Patient was discharged to skilled nursing facility in the last admission but decided to go home after that.PT recommends discharge to SNF this admission.[name] He will need a Covid test done prior to discharge please order this close to the time of discharge.   Body mass index is 17.62 kg/m. DVT prophylaxis: apixaban Antimicrobials: Augmentin  Fluid: NS@75  ml/hr Diet: Regular diet,  Code Status:  Full code Mobility: SNF recommended Family Communication:  Not at bedside Discharge plan:  Anticipated date and disposition: Anticipate medical stability 1 to 2 days for discharge to SNF Barriers: Continues to have difficulty breathing, cough, high oxygen requirement. Barriers to discharge -patient with new diagnosis of pneumonia, daughter very concerned that he was discharged from the hospital too early last time and was taken to rehab at [facility].  Apparently [place] told her that he will not get therapy 5 times a week.   Consultants:  Orthopedics   Antimicrobials: Anti-infectives (From admission, onward)   Start     Dose/Rate Route Frequency Ordered Stop   02/21/20 1500  amoxicillin-clavulanate (AUGMENTIN) 875-125 MG per tablet 1 tablet     1 tablet Oral 2 times daily 02/21/20 1445     02/18/20 2300  ceFAZolin (ANCEF) IVPB 2g/100 mL premix     2 g 200 mL/hr over 30 Minutes Intravenous Every 6 hours 02/18/20 2036 02/19/20 0446   02/18/20 1300  ceFAZolin (ANCEF) IVPB 2g/100 mL premix     2 g 200 mL/hr over 30 Minutes Intravenous On call to O.R. 02/17/20 2239 02/18/20 1706   02/17/20 2200  demeclocycline (DECLOMYCIN) tablet 300 mg    Note to Pharmacy: Take 2 tablets twice a day     300 mg Oral 2 times daily 02/17/20 1544          Code Status: Full Code   Diet Order            Diet regular Room service appropriate? Yes; Fluid consistency: Thin  Diet effective now              Infusions:  .  sodium chloride 75 mL/hr at 02/24/20 0519  . magnesium sulfate bolus IVPB    . sodium chloride      Scheduled Meds: . acidophilus  1 capsule Oral Daily  . alum & mag hydroxide-simeth  30 mL Oral Once  . amoxicillin-clavulanate  1 tablet Oral BID  . apixaban  5 mg Oral BID  . Chlorhexidine Gluconate Cloth  6 each Topical Daily  . demeclocycline  300 mg Oral BID  . diltiazem  180 mg Oral Q1200  . ferrous sulfate  325 mg Oral TID PC  . fluticasone  2 spray Each Nare QHS  . guaiFENesin  600 mg Oral BID  . levothyroxine  50 mcg Oral QAC breakfast  . magnesium oxide  400 mg Oral Daily  . mirtazapine  30 mg Oral QHS  . multivitamin with minerals  1 tablet Oral Daily  . omega-3 acid ethyl esters  1 g Oral Daily  . pantoprazole  40 mg Oral Daily  . tamsulosin  0.4 mg Oral BID  . thiamine  100 mg Oral Daily    PRN meds: acetaminophen, ALPRAZolam, alum & mag hydroxide-simeth, benzonatate, bisacodyl, HYDROcodone-acetaminophen, HYDROcodone-acetaminophen, levalbuterol, magnesium citrate, menthol-cetylpyridinium **OR** phenol, morphine injection, ondansetron **OR** ondansetron (ZOFRAN) IV, polyethylene glycol, sodium chloride   Objective: Vitals:   02/24/20 1200 02/24/20 1405  BP:  (!) 108/58  Pulse: 98 100  Resp:  20  Temp:  98.7 F (37.1 C)  SpO2:  98%    Intake/Output Summary (Last 24 hours) at 02/24/2020 1634 Last data filed at 02/24/2020 1500 Gross per 24 hour  Intake 1981.44 ml  Output 1275 ml  Net 706.44 ml   Filed Weights   02/17/20 1341 02/17/20 2048  Weight: 59 kg 57.3 kg  Weight change:  Body mass index is 17.62 kg/m.   Physical Exam: General exam: Working with PT. Skin: No rashes, lesions or ulcers. HEENT: Atraumatic, normocephalic, supple neck, no obvious bleeding Lungs: Congested cough CVS: Regular rate and rhythm, no murmur GI/Abd soft, nontender, nondistended CNS: Alert, awake, oriented Psychiatry: Mood appropriate Extremities: No pedal edema, no calf  tenderness  Data Review: I have personally reviewed the laboratory data and studies available.  Recent Labs  Lab 02/20/20 0415 02/21/20 0409 02/22/20 0335 02/23/20 0345 02/24/20 0347  WBC 17.1* 13.1* 11.3* 12.0* 8.9  HGB 10.6* 10.2* 10.2* 10.7* 9.9*  HCT 31.4* 30.8* 31.3* 32.8* 30.5*  MCV 89.2 92.2 91.8 93.7 93.3  PLT 266 270 308 347 332   Recent Labs  Lab 02/19/20 0314 02/20/20 0415 02/21/20 0409 02/22/20 0335 02/24/20 0347  NA 131* 132* 136 135 132*  K 3.9 4.1 5.3* 4.8 4.7  CL 101 102 104 98 98  CO2 22 25 28 28 28   GLUCOSE 115* 152* 117* 105* 96  BUN 16 22 29* 30* 24*  CREATININE 0.74 0.50* 0.58* 0.49* 0.61  CALCIUM 8.1* 8.3* 8.8* 8.4* 8.1*  MG 1.4* 1.8 1.9 1.7 1.6*    Signed, Terrilee Croak, MD [facility] Pager: [phone] (Secure Chat preferred). 02/24/2020

## 2020-02-24 NOTE — Plan of Care (Signed)

## 2020-02-24 NOTE — Progress Notes (Addendum)
Subjective: 6 Days Post-Op s/p Procedure(s): LEFT HIP HEMIARTHROPLASTY   Patient is alert, oriented, laying in bed. Reports pain to be well controlled in left hip except during therapy this morning. No other complaints. Patient is severely deconditioned due to Stage IV lung cancer, recent diverticulitis, COVID-19 infection.  Patient has long history of peripheral neuropathy, denies new paraesthesias.     Objective:  PE: VITALS:   Vitals:   02/23/20 2026 02/24/20 0645 02/24/20 1200 02/24/20 1405  BP: 121/72 (!) 101/54  (!) 108/58  Pulse: 94 95 98 100  Resp: 20 18  20   Temp: 98.4 F (36.9 C) 98.5 F (36.9 C)  98.7 F (37.1 C)  TempSrc:  Oral  Oral  SpO2: 96% 96%  98%  Weight:      Height:        ABD soft Neurovascular intact Sensation intact distally Intact pulses distally Dorsiflexion/Plantar flexion intact Incision: scant drainage  LABS  Results for orders placed or performed during the hospital encounter of 02/17/20 (from the past 24 hour(s))  CBC     Status: Abnormal   Collection Time: 02/24/20  3:47 AM  Result Value Ref Range   WBC 8.9 4.0 - 10.5 K/uL   RBC 3.27 (L) 4.22 - 5.81 MIL/uL   Hemoglobin 9.9 (L) 13.0 - 17.0 g/dL   HCT 30.5 (L) 39.0 - 52.0 %   MCV 93.3 80.0 - 100.0 fL   MCH 30.3 26.0 - 34.0 pg   MCHC 32.5 30.0 - 36.0 g/dL   RDW 15.3 11.5 - 15.5 %   Platelets 332 150 - 400 K/uL   nRBC 0.0 0.0 - 0.2 %  Basic metabolic panel     Status: Abnormal   Collection Time: 02/24/20  3:47 AM  Result Value Ref Range   Sodium 132 (L) 135 - 145 mmol/L   Potassium 4.7 3.5 - 5.1 mmol/L   Chloride 98 98 - 111 mmol/L   CO2 28 22 - 32 mmol/L   Glucose, Bld 96 70 - 99 mg/dL   BUN 24 (H) 8 - 23 mg/dL   Creatinine, Ser 0.61 0.61 - 1.24 mg/dL   Calcium 8.1 (L) 8.9 - 10.3 mg/dL   GFR calc non Af Amer >60 >60 mL/min   GFR calc Af Amer >60 >60 mL/min   Anion gap 6 5 - 15  Magnesium     Status: Abnormal   Collection Time: 02/24/20  3:47 AM  Result Value Ref  Range   Magnesium 1.6 (L) 1.7 - 2.4 mg/dL   *Note: Due to a large number of results and/or encounters for the requested time period, some results have not been displayed. A complete set of results can be found in Results Review.    DG Chest 1 View  Result Date: 02/22/2020 CLINICAL DATA:  79 y/o pt with recent hip surgery (3/16), hx of covid, HTN, left upper lobe cancer, pericardial effusion, a-fib and pulmonary nodules. EXAM: CHEST  1 VIEW COMPARISON:  Chest radiograph 02/20/2020 FINDINGS: Stable cardiomediastinal contours. Lungs are hyperinflated. Bandlike opacities at the right lung base likely reflect atelectasis. Persistent left basilar consolidation which may reflect atelectasis or infiltrate. No pneumothorax or large pleural effusion. Multiple bilateral old rib fractures. IMPRESSION: Persistent left basilar consolidation which may reflect atelectasis or infiltrate. Bandlike opacities in the right lung base likely reflecting atelectasis. Electronically Signed   By: Audie Pinto M.D.   On: 02/22/2020 17:13   DG Chest Port 1 View  Result Date: 02/24/2020  CLINICAL DATA:  Dyspnea after hip arthroplasty. EXAM: PORTABLE CHEST 1 VIEW COMPARISON:  Chest x-rays dated 02/22/2020 and 02/20/2020 and CT scan dated 01/23/2020 FINDINGS: There is increased atelectasis and/or consolidation at the left lung base posterior medially. The patient has extensive parenchymal scarring bilaterally with emphysematous changes in the upper lobes. The heart size and pulmonary vascularity are normal. Aortic atherosclerosis. Multiple old right rib fractures. Previous right proximal humerus fracture. Left total shoulder prosthesis. IMPRESSION: Slightly increased atelectasis and/or consolidation at the left lung base. Aortic Atherosclerosis (ICD10-I70.0) and Emphysema (ICD10-J43.9). Electronically Signed   By: Lorriane Shire M.D.   On: 02/24/2020 11:36    Assessment/Plan: Principal Problem:   Left displaced femoral neck  fracture (HCC) Active Problems:   Hyponatremia   BPH (benign prostatic hyperplasia)   GERD (gastroesophageal reflux disease)   Fall   Physical deconditioning   Hypoalbuminemia   Malignant neoplasm of lower lobe of right lung (HCC)   Atrial fibrillation (HCC)   Hypertension   SIADH (syndrome of inappropriate ADH production) ([place])   Diverticulitis   Pericardial effusion   Thromboembolism (HCC)   Hip fracture (HCC)   Left Femoral neck fracture: 6 Days Post-Op s/p Procedure(s):LEFT HIP HEMIARTHROPLASTY Weightbearing:WBAT LLE, up with therapy. He is making slow progress due to deconditioning Insicional and dressing care:PRN dressing changes, will plan to change left hip dressing tomorrow. VTE prophylaxis:Eliquis, SCDs Pain control:continue current pain control regimen, patient can use Norco 10 for severe pain if needed Follow - up plan:Follow-up with Dr. Mardelle Matte 2 weeks after discharge Dispo:PT recommending SNF, TOC consult placed.   Contact information:   Weekdays 8-5 [address], [place] [phone] A fter hours and holidays please check [email or web address] for group call information for [facility]  Ventura Bruns 02/24/2020, 4:24 PM

## 2020-02-24 NOTE — Progress Notes (Signed)
Physical Therapy Treatment Patient Details Name: Anthony Curry. MRN: [id number] DOB: 11-09-1941 Today's Date: 02/24/2020    History of Present Illness Patient is 79 y.o. male s/p fall at home on 02/17/20 while transfering with RW. Pt found to have displaced left femoral neck fracture and now s/p Left hip hemiarthroplasty on 02/18/20.  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    PT Comments    POD # 6 Left Posterior Hemiarthroplasty due to fall/fx Pt progressing slowly.  General bed mobility comments: required increased assist due to L hip pain and dyspnea.  Pt reported a "bad night of coughing" with increaased c/o fatigue today.  General Gait Details: VERY weak.  Required + 2 side by side assist and used a B platform walker (EVA WALKER) for increased support.  MAX c/o dyspnea.  Pt remained on 2 lts sats 91%.  VERY deconditioned.  Limted activity tolerance.  Follow Up Recommendations  SNF     Equipment Recommendations  None recommended by PT    Recommendations for Other Services       Precautions / Restrictions Precautions Precautions: Posterior Hip;Fall Precaution Comments: monitor HR; reviewed posterior hip precautions pt verbally recalled 3/3 Restrictions Weight Bearing Restrictions: No Other Position/Activity Restrictions: WBAT    Mobility  Bed Mobility Overal bed mobility: Needs Assistance Bed Mobility: Supine to Sit     Supine to sit: Mod assist;+2 for physical assistance;+2 for safety/equipment     General bed mobility comments: required increased assist  due to L hip pain and dyspnea.  Pt reported a "bad night of coughing" with increaased c/o fatigue today.  Transfers Overall transfer level: Needs assistance Equipment used: Bilateral platform walker Transfers: Sit to/from Stand Sit to Stand: Mod assist;+2 physical assistance;+2 safety/equipment         General transfer comment: from elevated bed + 2 side by side assist used a B platform EVA walker this session for increased support.  Very limited activity tolerance and DOE.  Pt remained on 2 lts sats avg 91% and HR 118.  MAX c/o weakness, fatigue, poor energy.  Ambulation/Gait Ambulation/Gait assistance: Max assist;+2 physical assistance;+2 safety/equipment Gait Distance (Feet): 4 Feet Assistive device: Bilateral platform walker(EVA walker) Gait Pattern/deviations: Step-to pattern;Decreased stance time - left Gait velocity: decreased   General Gait Details: VERY weak.  Required + 2 side by side assist and used a B platform walker (EVA WALKER) for increased support.  MAX c/o dyspnea.  Pt remained on 2 lts sats 91%.  VERY deconditioned.  Limted activity tolerance.   Stairs             Wheelchair Mobility    Modified Rankin (Stroke Patients Only)       Balance                                            Cognition Arousal/Alertness: Awake/alert Behavior During Therapy: WFL for tasks assessed/performed Overall Cognitive Status: Within Functional Limits for tasks assessed  Exercises      General Comments        Pertinent Vitals/Pain Pain Assessment: Faces Faces Pain Scale: Hurts little more Pain Location: Lt hip Pain Descriptors / Indicators: Grimacing;Guarding;Discomfort Pain Intervention(s): Monitored during session;Repositioned;Premedicated before session    Home Living                      Prior Function            PT Goals (current goals can now be found in the care  plan section) Progress towards PT goals: Progressing toward goals    Frequency    Min 3X/week      PT Plan Current plan remains appropriate    Co-evaluation              AM-PAC PT "6 Clicks" Mobility   Outcome Measure  Help needed turning from your back to your side while in a flat bed without using bedrails?: A Little Help needed moving from lying on your back to sitting on the side of a flat bed without using bedrails?: A Little Help needed moving to and from a bed to a chair (including a wheelchair)?: A Lot Help needed standing up from a chair using your arms (e.g., wheelchair or bedside chair)?: Total Help needed to walk in hospital room?: Total Help needed climbing 3-5 steps with a railing? : Total 6 Click Score: 11    End of Session Equipment Utilized During Treatment: Gait belt;Oxygen Activity Tolerance: Patient limited by fatigue;Patient limited by pain Patient left: in chair;with call bell/phone within reach;with chair alarm set;with family/visitor present Nurse Communication: Mobility status PT Visit Diagnosis: Muscle weakness (generalized) (M62.81);Difficulty in walking, not elsewhere classified (R26.2)     Time: 0981-1914 PT Time Calculation (min) (ACUTE ONLY): 30 min  Charges:  $Gait Training: 8-22 mins $Therapeutic Activity: 8-22 mins                     Rica Koyanagi  PTA Acute  Rehabilitation Services Pager      [phone] Office      [phone]

## 2020-02-24 NOTE — NC FL2 (Signed)
[place] LEVEL OF CARE SCREENING TOOL     IDENTIFICATION  Patient Name: Anthony Curry. Birthdate: 1941-06-24 Sex: male Admission Date (Current Location): 02/17/2020  [facility] and [place] Number:  Herbalist and Address:  [facility],  [place] [address] [place]      Provider Number: [id number]  Attending Physician Name and Address:  Terrilee Croak, MD  Relative Name and Phone Number:  [name]  [phone] daughter    Current Level of Care: Hospital Recommended Level of Care: [place] Prior Approval Number:    Date Approved/Denied:   PASRR Number:    Discharge Plan: SNF    Current Diagnoses: Patient Active Problem List   Diagnosis Date Noted  . Left displaced femoral neck fracture ([place]) 02/17/2020  . Pericardial effusion 02/17/2020  . Thromboembolism ([place]) 02/17/2020  . Hip fracture ([place]) 02/17/2020  . Diverticulitis 01/02/2020  . Acute diverticulitis 01/02/2020  . Ataxia 06/30/2019  . SIADH (syndrome of inappropriate ADH production) ([place]) 06/30/2019  . Generalized weakness 06/28/2019  . Lactic acidosis 06/28/2019  . Abnormal urinalysis 06/28/2019  . Hypertension 12/07/2016  . 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 Acute urinary retention 09/09/2015  . Choledocholithiasis with cholecystitis 09/08/2015  . Common bile duct stone   . Encounter for antineoplastic immunotherapy 06/28/2015  . Atrial fibrillation ([place]) 06/11/2015  . Gynecomastia, male 06/10/2015  . Dizziness 05/11/2015  . Malignant neoplasm of lower lobe of right lung ([place]) 09/17/2014  . Neutropenic fever ([place]) 09/02/2014  . Neoplastic malignant related fatigue 08/13/2014  . Physical deconditioning 08/13/2014  . Hypoalbuminemia 08/13/2014  . Diarrhea 08/13/2014  . Antineoplastic chemotherapy induced anemia(285.3) 08/13/2014  . Atrial fibrillation with RVR ([place]) 07/28/2014  . HCAP (healthcare-associated pneumonia)  07/25/2014  . Alcoholism in remission ([place]) 07/25/2014  . Atrial flutter by electrocardiogram ([place]) 07/25/2014  . Swelling of limb 06/10/2014  . Lung cancer ([place]) 10/15/2013  . Hypotension 09/10/2013  . Adrenal mass ([place]) 09/09/2013  . 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 Pulmonary nodules 08/19/2013  . Anemia of chronic disease 07/03/2013  . BPH (benign prostatic hyperplasia) 07/03/2013  . GERD (gastroesophageal reflux disease) 07/03/2013  . Fall 07/03/2013  . Alcohol intoxication ([place]) 07/03/2013  . 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 Alcohol abuse 10/15/2011  . Hyponatremia 10/15/2011    Orientation RESPIRATION BLADDER Height & Weight     Self, Time, Situation, Place  O2(2L O2 [place]) Indwelling catheter(Chronic) Weight: 57.3 kg Height:  5\' 11"  (180.3 cm)  BEHAVIORAL SYMPTOMS/MOOD NEUROLOGICAL BOWEL NUTRITION STATUS      Continent Diet(Regular)  AMBULATORY STATUS COMMUNICATION OF NEEDS Skin   Extensive Assist Verbally Surgical wounds, Skin abrasions(Ostomy, left hip surgical wound, Ecchymosis,)                       Personal Care Assistance Level of Assistance  Bathing, Feeding, Dressing Bathing Assistance: Limited assistance Feeding assistance: Limited assistance Dressing Assistance: Limited assistance     Functional Limitations Info  Sight, Hearing, Speech Sight Info: Impaired Hearing Info: Adequate Speech Info: Adequate    SPECIAL CARE FACTORS FREQUENCY  PT (By licensed PT), OT (By licensed OT)     PT Frequency: x5 OT Frequency: x5            Contractures Contractures Info: Present    Additional Factors Info  Code Status, Allergies Code Status Info: FULL Allergies Info: No Known Allergies           Current Medications (  02/24/2020):  This is the current hospital active medication list Current Facility-Administered Medications  Medication Dose Route Frequency Provider Last Rate Last Admin  . 0.9 %  sodium chloride infusion    Intravenous Continuous Georgette Shell, MD 75 mL/hr at 02/24/20 0519 New Bag at 02/24/20 0519  . acetaminophen (TYLENOL) tablet 325-650 mg  325-650 mg Oral Q6H PRN Merlene Pulling K, PA-C      . acidophilus (RISAQUAD) capsule 1 capsule  1 capsule Oral Daily Merlene Pulling K, PA-C   1 capsule at 02/24/20 0913  . ALPRAZolam Duanne Moron) tablet 0.5 mg  0.5 mg Oral QHS PRN Merlene Pulling K, PA-C   0.5 mg at 02/22/20 2210  . alum & mag hydroxide-simeth (MAALOX/MYLANTA) 200-200-20 MG/5ML suspension 30 mL  30 mL Oral Q4H PRN Merlene Pulling K, PA-C   30 mL at 02/22/20 1019  . alum & mag hydroxide-simeth (MAALOX/MYLANTA) 200-200-20 MG/5ML suspension 30 mL  30 mL Oral Once Lang Snow, FNP      . amoxicillin-clavulanate (AUGMENTIN) 875-125 MG per tablet 1 tablet  1 tablet Oral BID Flora Lipps, MD   1 tablet at 02/24/20 0913  . apixaban (ELIQUIS) tablet 5 mg  5 mg Oral BID Merlene Pulling K, PA-C   5 mg at 02/24/20 0913  . benzonatate (TESSALON) capsule 200 mg  200 mg Oral TID PRN Merlene Pulling K, PA-C   200 mg at 02/22/20 1019  . bisacodyl (DULCOLAX) suppository 10 mg  10 mg Rectal Daily PRN Merlene Pulling K, PA-C      . Chlorhexidine Gluconate Cloth 2 % PADS 6 each  6 each Topical Daily Pokhrel, Laxman, MD   6 each at 02/24/20 0914  . demeclocycline (DECLOMYCIN) tablet 300 mg  300 mg Oral BID Merlene Pulling K, PA-C   300 mg at 02/24/20 0913  . diltiazem (CARDIZEM CD) 24 hr capsule 180 mg  180 mg Oral Q1200 Ventura Bruns, PA-C   180 mg at 02/24/20 1218  . ferrous sulfate tablet 325 mg  325 mg Oral TID PC Brown, Blaine K, PA-C   325 mg at 02/24/20 1617  . fluticasone (FLONASE) 50 MCG/ACT nasal spray 2 spray  2 spray Each Nare QHS Merlene Pulling K, PA-C   2 spray at 02/22/20 2208  . guaiFENesin (MUCINEX) 12 hr tablet 600 mg  600 mg Oral BID Merlene Pulling K, PA-C   600 mg at 02/24/20 0913  . HYDROcodone-acetaminophen (NORCO) 10-325 MG per tablet 1-2 tablet  1-2 tablet Oral Q4H PRN Merlene Pulling K, PA-C      .  HYDROcodone-acetaminophen (NORCO/VICODIN) 5-325 MG per tablet 1-2 tablet  1-2 tablet Oral Q4H PRN Merlene Pulling K, PA-C   1 tablet at 02/23/20 2109  . levalbuterol (XOPENEX) nebulizer solution 0.63 mg  0.63 mg Nebulization Q6H PRN Georgette Shell, MD      . levothyroxine (SYNTHROID) tablet 50 mcg  50 mcg Oral QAC breakfast Merlene Pulling K, PA-C   50 mcg at 02/24/20 0513  . magnesium citrate solution 1 Bottle  1 Bottle Oral Once PRN Merlene Pulling K, PA-C      . magnesium oxide (MAG-OX) tablet 400 mg  400 mg Oral Daily Merlene Pulling K, PA-C   400 mg at 02/24/20 0913  . magnesium sulfate IVPB 2 g 50 mL  2 g Intravenous Once Terrilee Croak, MD 50 mL/hr at 02/24/20 1641 2 g at 02/24/20 1641  . menthol-cetylpyridinium (CEPACOL) lozenge 3 mg  1 lozenge Oral PRN Ventura Bruns, PA-C  Or  . phenol (CHLORASEPTIC) mouth spray 1 spray  1 spray Mouth/Throat PRN Merlene Pulling K, PA-C      . mirtazapine (REMERON) tablet 30 mg  30 mg Oral QHS Merlene Pulling K, PA-C   30 mg at 02/23/20 2107  . morphine 2 MG/ML injection 0.5-1 mg  0.5-1 mg Intravenous Q2H PRN Merlene Pulling K, PA-C   1 mg at 02/22/20 1021  . multivitamin with minerals tablet 1 tablet  1 tablet Oral Daily Ventura Bruns, PA-C   1 tablet at 02/24/20 0912  . omega-3 acid ethyl esters (LOVAZA) capsule 1 g  1 g Oral Daily Merlene Pulling K, PA-C   1 g at 02/24/20 0913  . ondansetron (ZOFRAN) tablet 4 mg  4 mg Oral Q6H PRN Merlene Pulling K, PA-C       Or  . ondansetron [facility]) injection 4 mg  4 mg Intravenous Q6H PRN Merlene Pulling K, PA-C   4 mg at 02/20/20 0223  . pantoprazole (PROTONIX) EC tablet 40 mg  40 mg Oral Daily Lang Snow, FNP   40 mg at 02/24/20 0913  . polyethylene glycol (MIRALAX / GLYCOLAX) packet 17 g  17 g Oral Daily PRN Merlene Pulling K, PA-C      . sodium chloride 0.9 % bolus 500 mL  500 mL Intravenous Once PRN Pokhrel, Laxman, MD      . tamsulosin (FLOMAX) capsule 0.4 mg  0.4 mg Oral BID Merlene Pulling K, PA-C   0.4 mg at  02/24/20 0913  . thiamine tablet 100 mg  100 mg Oral Daily Ventura Bruns, PA-C   100 mg at 02/24/20 0932     Discharge Medications: Please see discharge summary for a list of discharge medications.  Relevant Imaging Results:  Relevant Lab Results:   Additional Information [id number]  Purcell Mouton, RN

## 2020-02-24 NOTE — NC FL2 (Deleted)
[place] LEVEL OF CARE SCREENING TOOL     IDENTIFICATION  Patient Name: Anthony Curry. Birthdate: 1941/06/19 Sex: male Admission Date (Current Location): 02/17/2020  [facility] and [place] Number:  Herbalist and Address:  [facility],  [place] [address] [place]      Provider Number: [id number]  Attending Physician Name and Address:  Terrilee Croak, MD  Relative Name and Phone Number:  [name]  [phone] daughter    Current Level of Care: Hospital Recommended Level of Care: [place] Prior Approval Number:    Date Approved/Denied:   PASRR Number:    Discharge Plan: SNF    Current Diagnoses: Patient Active Problem List   Diagnosis Date Noted  . Left displaced femoral neck fracture ([place]) 02/17/2020  . Pericardial effusion 02/17/2020  . Thromboembolism ([place]) 02/17/2020  . Hip fracture ([place]) 02/17/2020  . Diverticulitis 01/02/2020  . Acute diverticulitis 01/02/2020  . Ataxia 06/30/2019  . SIADH (syndrome of inappropriate ADH production) ([place]) 06/30/2019  . Generalized weakness 06/28/2019  . Lactic acidosis 06/28/2019  . Abnormal urinalysis 06/28/2019  . Hypertension 12/07/2016  . 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 Acute urinary retention 09/09/2015  . Choledocholithiasis with cholecystitis 09/08/2015  . Common bile duct stone   . Encounter for antineoplastic immunotherapy 06/28/2015  . Atrial fibrillation ([place]) 06/11/2015  . Gynecomastia, male 06/10/2015  . Dizziness 05/11/2015  . Malignant neoplasm of lower lobe of right lung ([place]) 09/17/2014  . Neutropenic fever ([place]) 09/02/2014  . Neoplastic malignant related fatigue 08/13/2014  . Physical deconditioning 08/13/2014  . Hypoalbuminemia 08/13/2014  . Diarrhea 08/13/2014  . Antineoplastic chemotherapy induced anemia(285.3) 08/13/2014  . Atrial fibrillation with RVR ([place]) 07/28/2014  . HCAP (healthcare-associated pneumonia)  07/25/2014  . Alcoholism in remission ([place]) 07/25/2014  . Atrial flutter by electrocardiogram ([place]) 07/25/2014  . Swelling of limb 06/10/2014  . Lung cancer ([place]) 10/15/2013  . Hypotension 09/10/2013  . Adrenal mass ([place]) 09/09/2013  . 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 Pulmonary nodules 08/19/2013  . Anemia of chronic disease 07/03/2013  . BPH (benign prostatic hyperplasia) 07/03/2013  . GERD (gastroesophageal reflux disease) 07/03/2013  . Fall 07/03/2013  . Alcohol intoxication ([place]) 07/03/2013  . 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 Alcohol abuse 10/15/2011  . Hyponatremia 10/15/2011    Orientation RESPIRATION BLADDER Height & Weight     Self, Time, Situation, Place  O2(2L O2 [place]) Indwelling catheter(Chronic) Weight: 57.3 kg Height:  5\' 11"  (180.3 cm)  BEHAVIORAL SYMPTOMS/MOOD NEUROLOGICAL BOWEL NUTRITION STATUS      Continent Diet(Regular)  AMBULATORY STATUS COMMUNICATION OF NEEDS Skin   Extensive Assist Verbally Surgical wounds, Skin abrasions(Ostomy, left hip surgical wound, Ecchymosis,)                       Personal Care Assistance Level of Assistance  Bathing, Feeding, Dressing Bathing Assistance: Limited assistance Feeding assistance: Limited assistance Dressing Assistance: Limited assistance     Functional Limitations Info  Sight, Hearing, Speech Sight Info: Impaired Hearing Info: Adequate Speech Info: Adequate    SPECIAL CARE FACTORS FREQUENCY  PT (By licensed PT), OT (By licensed OT)     PT Frequency: x5 OT Frequency: x5            Contractures Contractures Info: Present    Additional Factors Info  Code Status, Allergies Code Status Info: FULL Allergies Info: No Known Allergies           Current Medications (  02/24/2020):  This is the current hospital active medication list Current Facility-Administered Medications  Medication Dose Route Frequency Provider Last Rate Last Admin  . 0.9 %  sodium chloride infusion    Intravenous Continuous Georgette Shell, MD 75 mL/hr at 02/24/20 0519 New Bag at 02/24/20 0519  . acetaminophen (TYLENOL) tablet 325-650 mg  325-650 mg Oral Q6H PRN Merlene Pulling K, PA-C      . acidophilus (RISAQUAD) capsule 1 capsule  1 capsule Oral Daily Merlene Pulling K, PA-C   1 capsule at 02/24/20 0913  . ALPRAZolam Duanne Moron) tablet 0.5 mg  0.5 mg Oral QHS PRN Merlene Pulling K, PA-C   0.5 mg at 02/22/20 2210  . alum & mag hydroxide-simeth (MAALOX/MYLANTA) 200-200-20 MG/5ML suspension 30 mL  30 mL Oral Q4H PRN Merlene Pulling K, PA-C   30 mL at 02/22/20 1019  . alum & mag hydroxide-simeth (MAALOX/MYLANTA) 200-200-20 MG/5ML suspension 30 mL  30 mL Oral Once Lang Snow, FNP      . amoxicillin-clavulanate (AUGMENTIN) 875-125 MG per tablet 1 tablet  1 tablet Oral BID Flora Lipps, MD   1 tablet at 02/24/20 0913  . apixaban (ELIQUIS) tablet 5 mg  5 mg Oral BID Merlene Pulling K, PA-C   5 mg at 02/24/20 0913  . benzonatate (TESSALON) capsule 200 mg  200 mg Oral TID PRN Merlene Pulling K, PA-C   200 mg at 02/22/20 1019  . bisacodyl (DULCOLAX) suppository 10 mg  10 mg Rectal Daily PRN Merlene Pulling K, PA-C      . Chlorhexidine Gluconate Cloth 2 % PADS 6 each  6 each Topical Daily Pokhrel, Laxman, MD   6 each at 02/24/20 0914  . demeclocycline (DECLOMYCIN) tablet 300 mg  300 mg Oral BID Merlene Pulling K, PA-C   300 mg at 02/24/20 0913  . diltiazem (CARDIZEM CD) 24 hr capsule 180 mg  180 mg Oral Q1200 Ventura Bruns, PA-C   180 mg at 02/24/20 1218  . ferrous sulfate tablet 325 mg  325 mg Oral TID PC Brown, Blaine K, PA-C   325 mg at 02/24/20 1617  . fluticasone (FLONASE) 50 MCG/ACT nasal spray 2 spray  2 spray Each Nare QHS Merlene Pulling K, PA-C   2 spray at 02/22/20 2208  . guaiFENesin (MUCINEX) 12 hr tablet 600 mg  600 mg Oral BID Merlene Pulling K, PA-C   600 mg at 02/24/20 0913  . HYDROcodone-acetaminophen (NORCO) 10-325 MG per tablet 1-2 tablet  1-2 tablet Oral Q4H PRN Merlene Pulling K, PA-C      .  HYDROcodone-acetaminophen (NORCO/VICODIN) 5-325 MG per tablet 1-2 tablet  1-2 tablet Oral Q4H PRN Merlene Pulling K, PA-C   1 tablet at 02/23/20 2109  . levalbuterol (XOPENEX) nebulizer solution 0.63 mg  0.63 mg Nebulization Q6H PRN Georgette Shell, MD      . levothyroxine (SYNTHROID) tablet 50 mcg  50 mcg Oral QAC breakfast Merlene Pulling K, PA-C   50 mcg at 02/24/20 0513  . magnesium citrate solution 1 Bottle  1 Bottle Oral Once PRN Merlene Pulling K, PA-C      . magnesium oxide (MAG-OX) tablet 400 mg  400 mg Oral Daily Merlene Pulling K, PA-C   400 mg at 02/24/20 0913  . magnesium sulfate IVPB 2 g 50 mL  2 g Intravenous Once Terrilee Croak, MD 50 mL/hr at 02/24/20 1641 2 g at 02/24/20 1641  . menthol-cetylpyridinium (CEPACOL) lozenge 3 mg  1 lozenge Oral PRN Ventura Bruns, PA-C  Or  . phenol (CHLORASEPTIC) mouth spray 1 spray  1 spray Mouth/Throat PRN Merlene Pulling K, PA-C      . mirtazapine (REMERON) tablet 30 mg  30 mg Oral QHS Merlene Pulling K, PA-C   30 mg at 02/23/20 2107  . morphine 2 MG/ML injection 0.5-1 mg  0.5-1 mg Intravenous Q2H PRN Merlene Pulling K, PA-C   1 mg at 02/22/20 1021  . multivitamin with minerals tablet 1 tablet  1 tablet Oral Daily Ventura Bruns, PA-C   1 tablet at 02/24/20 0912  . omega-3 acid ethyl esters (LOVAZA) capsule 1 g  1 g Oral Daily Merlene Pulling K, PA-C   1 g at 02/24/20 0913  . ondansetron (ZOFRAN) tablet 4 mg  4 mg Oral Q6H PRN Merlene Pulling K, PA-C       Or  . ondansetron [facility]) injection 4 mg  4 mg Intravenous Q6H PRN Merlene Pulling K, PA-C   4 mg at 02/20/20 0223  . pantoprazole (PROTONIX) EC tablet 40 mg  40 mg Oral Daily Lang Snow, FNP   40 mg at 02/24/20 0913  . polyethylene glycol (MIRALAX / GLYCOLAX) packet 17 g  17 g Oral Daily PRN Merlene Pulling K, PA-C      . sodium chloride 0.9 % bolus 500 mL  500 mL Intravenous Once PRN Pokhrel, Laxman, MD      . tamsulosin (FLOMAX) capsule 0.4 mg  0.4 mg Oral BID Merlene Pulling K, PA-C   0.4 mg at  02/24/20 0913  . thiamine tablet 100 mg  100 mg Oral Daily Ventura Bruns, PA-C   100 mg at 02/24/20 5462     Discharge Medications: Please see discharge summary for a list of discharge medications.  Relevant Imaging Results:  Relevant Lab Results:   Additional Information [id number]  Purcell Mouton, RN

## 2020-02-25 DIAGNOSIS — S72042S Displaced fracture of base of neck of left femur, sequela: Secondary | ICD-10-CM | POA: Diagnosis not present

## 2020-02-25 DIAGNOSIS — S72002A Fracture of unspecified part of neck of left femur, initial encounter for closed fracture: Secondary | ICD-10-CM | POA: Diagnosis not present

## 2020-02-25 DIAGNOSIS — I480 Paroxysmal atrial fibrillation: Secondary | ICD-10-CM | POA: Diagnosis not present

## 2020-02-25 DIAGNOSIS — J449 Chronic obstructive pulmonary disease, unspecified: Secondary | ICD-10-CM | POA: Diagnosis not present

## 2020-02-25 DIAGNOSIS — R338 Other retention of urine: Secondary | ICD-10-CM | POA: Diagnosis not present

## 2020-02-25 DIAGNOSIS — G4700 Insomnia, unspecified: Secondary | ICD-10-CM | POA: Diagnosis not present

## 2020-02-25 DIAGNOSIS — J189 Pneumonia, unspecified organism: Secondary | ICD-10-CM | POA: Diagnosis not present

## 2020-02-25 DIAGNOSIS — R1312 Dysphagia, oropharyngeal phase: Secondary | ICD-10-CM | POA: Diagnosis not present

## 2020-02-25 DIAGNOSIS — E871 Hypo-osmolality and hyponatremia: Secondary | ICD-10-CM | POA: Diagnosis not present

## 2020-02-25 DIAGNOSIS — R531 Weakness: Secondary | ICD-10-CM | POA: Diagnosis not present

## 2020-02-25 DIAGNOSIS — R918 Other nonspecific abnormal finding of lung field: Secondary | ICD-10-CM | POA: Diagnosis not present

## 2020-02-25 DIAGNOSIS — I749 Embolism and thrombosis of unspecified artery: Secondary | ICD-10-CM | POA: Diagnosis not present

## 2020-02-25 DIAGNOSIS — J690 Pneumonitis due to inhalation of food and vomit: Secondary | ICD-10-CM | POA: Diagnosis not present

## 2020-02-25 DIAGNOSIS — M6281 Muscle weakness (generalized): Secondary | ICD-10-CM | POA: Diagnosis not present

## 2020-02-25 DIAGNOSIS — F339 Major depressive disorder, recurrent, unspecified: Secondary | ICD-10-CM | POA: Diagnosis not present

## 2020-02-25 DIAGNOSIS — I313 Pericardial effusion (noninflammatory): Secondary | ICD-10-CM | POA: Diagnosis not present

## 2020-02-25 DIAGNOSIS — F329 Major depressive disorder, single episode, unspecified: Secondary | ICD-10-CM | POA: Diagnosis not present

## 2020-02-25 DIAGNOSIS — R05 Cough: Secondary | ICD-10-CM | POA: Diagnosis not present

## 2020-02-25 DIAGNOSIS — Z4789 Encounter for other orthopedic aftercare: Secondary | ICD-10-CM | POA: Diagnosis not present

## 2020-02-25 DIAGNOSIS — E46 Unspecified protein-calorie malnutrition: Secondary | ICD-10-CM | POA: Diagnosis not present

## 2020-02-25 DIAGNOSIS — Z85118 Personal history of other malignant neoplasm of bronchus and lung: Secondary | ICD-10-CM | POA: Diagnosis not present

## 2020-02-25 DIAGNOSIS — R319 Hematuria, unspecified: Secondary | ICD-10-CM | POA: Diagnosis not present

## 2020-02-25 DIAGNOSIS — Z96612 Presence of left artificial shoulder joint: Secondary | ICD-10-CM | POA: Diagnosis not present

## 2020-02-25 DIAGNOSIS — I4891 Unspecified atrial fibrillation: Secondary | ICD-10-CM | POA: Diagnosis not present

## 2020-02-25 DIAGNOSIS — Z741 Need for assistance with personal care: Secondary | ICD-10-CM | POA: Diagnosis not present

## 2020-02-25 DIAGNOSIS — N401 Enlarged prostate with lower urinary tract symptoms: Secondary | ICD-10-CM | POA: Diagnosis not present

## 2020-02-25 DIAGNOSIS — E039 Hypothyroidism, unspecified: Secondary | ICD-10-CM | POA: Diagnosis not present

## 2020-02-25 DIAGNOSIS — R300 Dysuria: Secondary | ICD-10-CM | POA: Diagnosis not present

## 2020-02-25 DIAGNOSIS — R0902 Hypoxemia: Secondary | ICD-10-CM | POA: Diagnosis not present

## 2020-02-25 DIAGNOSIS — Z96649 Presence of unspecified artificial hip joint: Secondary | ICD-10-CM | POA: Diagnosis not present

## 2020-02-25 DIAGNOSIS — R0609 Other forms of dyspnea: Secondary | ICD-10-CM | POA: Diagnosis not present

## 2020-02-25 DIAGNOSIS — R7981 Abnormal blood-gas level: Secondary | ICD-10-CM | POA: Diagnosis not present

## 2020-02-25 DIAGNOSIS — U071 COVID-19: Secondary | ICD-10-CM | POA: Diagnosis not present

## 2020-02-25 DIAGNOSIS — C3490 Malignant neoplasm of unspecified part of unspecified bronchus or lung: Secondary | ICD-10-CM | POA: Diagnosis not present

## 2020-02-25 DIAGNOSIS — R5381 Other malaise: Secondary | ICD-10-CM | POA: Diagnosis not present

## 2020-02-25 DIAGNOSIS — R339 Retention of urine, unspecified: Secondary | ICD-10-CM | POA: Diagnosis not present

## 2020-02-25 DIAGNOSIS — M2550 Pain in unspecified joint: Secondary | ICD-10-CM | POA: Diagnosis not present

## 2020-02-25 DIAGNOSIS — Z7401 Bed confinement status: Secondary | ICD-10-CM | POA: Diagnosis not present

## 2020-02-25 DIAGNOSIS — Z87891 Personal history of nicotine dependence: Secondary | ICD-10-CM | POA: Diagnosis not present

## 2020-02-25 DIAGNOSIS — R262 Difficulty in walking, not elsewhere classified: Secondary | ICD-10-CM | POA: Diagnosis not present

## 2020-02-25 DIAGNOSIS — J1282 Pneumonia due to coronavirus disease 2019: Secondary | ICD-10-CM | POA: Diagnosis not present

## 2020-02-25 DIAGNOSIS — G893 Neoplasm related pain (acute) (chronic): Secondary | ICD-10-CM | POA: Diagnosis not present

## 2020-02-25 DIAGNOSIS — R41841 Cognitive communication deficit: Secondary | ICD-10-CM | POA: Diagnosis not present

## 2020-02-25 DIAGNOSIS — F411 Generalized anxiety disorder: Secondary | ICD-10-CM | POA: Diagnosis not present

## 2020-02-25 DIAGNOSIS — Z433 Encounter for attention to colostomy: Secondary | ICD-10-CM | POA: Diagnosis not present

## 2020-02-25 DIAGNOSIS — R52 Pain, unspecified: Secondary | ICD-10-CM | POA: Diagnosis not present

## 2020-02-25 DIAGNOSIS — R5383 Other fatigue: Secondary | ICD-10-CM | POA: Diagnosis not present

## 2020-02-25 DIAGNOSIS — I10 Essential (primary) hypertension: Secondary | ICD-10-CM | POA: Diagnosis not present

## 2020-02-25 DIAGNOSIS — C3431 Malignant neoplasm of lower lobe, right bronchus or lung: Secondary | ICD-10-CM | POA: Diagnosis not present

## 2020-02-25 DIAGNOSIS — W19XXXS Unspecified fall, sequela: Secondary | ICD-10-CM | POA: Diagnosis not present

## 2020-02-25 LAB — CBC
HCT: 28.2 % — ABNORMAL LOW (ref 39.0–52.0)
Hemoglobin: 9.3 g/dL — ABNORMAL LOW (ref 13.0–17.0)
MCH: 30.7 pg (ref 26.0–34.0)
MCHC: 33 g/dL (ref 30.0–36.0)
MCV: 93.1 fL (ref 80.0–100.0)
Platelets: 347 10*3/uL (ref 150–400)
RBC: 3.03 MIL/uL — ABNORMAL LOW (ref 4.22–5.81)
RDW: 15.5 % (ref 11.5–15.5)
WBC: 7.8 10*3/uL (ref 4.0–10.5)
nRBC: 0 % (ref 0.0–0.2)

## 2020-02-25 MED ORDER — GUAIFENESIN ER 600 MG PO TB12: 600.0000 mg | ORAL_TABLET | Freq: Two times a day (BID) | ORAL | Status: DC

## 2020-02-25 MED ORDER — HYDROCODONE-ACETAMINOPHEN 5-325 MG PO TABS: 1.0000 | ORAL_TABLET | Freq: Four times a day (QID) | ORAL | 0 refills | Status: AC | PRN

## 2020-02-25 MED ORDER — AMOXICILLIN-POT CLAVULANATE 875-125 MG PO TABS: 1.0000 | ORAL_TABLET | Freq: Two times a day (BID) | ORAL | Status: AC

## 2020-02-25 MED ORDER — BENZONATATE 200 MG PO CAPS: 200.0000 mg | ORAL_CAPSULE | Freq: Three times a day (TID) | ORAL | 0 refills | Status: DC | PRN

## 2020-02-25 MED ORDER — MIRTAZAPINE 30 MG PO TABS: 30.0000 mg | ORAL_TABLET | Freq: Every day | ORAL | 0 refills | Status: DC

## 2020-02-25 MED ORDER — FERROUS SULFATE 325 (65 FE) MG PO TABS: 325.0000 mg | ORAL_TABLET | Freq: Three times a day (TID) | ORAL | 3 refills | Status: DC

## 2020-02-25 MED ORDER — LEVALBUTEROL HCL 0.63 MG/3ML IN NEBU: 0.6300 mg | INHALATION_SOLUTION | Freq: Four times a day (QID) | RESPIRATORY_TRACT | 12 refills | Status: DC | PRN

## 2020-02-25 MED ORDER — PANTOPRAZOLE SODIUM 40 MG PO TBEC: 40.0000 mg | DELAYED_RELEASE_TABLET | Freq: Every day | ORAL | Status: DC

## 2020-02-25 MED ORDER — BISACODYL 10 MG RE SUPP: 10.0000 mg | Freq: Every day | RECTAL | 0 refills | Status: DC | PRN

## 2020-02-25 NOTE — Progress Notes (Signed)
This RN attempted to call report to facility, [facility] to give report. No answer. Will try again.

## 2020-02-25 NOTE — TOC Progression Note (Signed)
Transition of Care (TOC) - Progression Note    Patient Details  Name: Anthony Curry. MRN: [id number] Date of Birth: 11-17-1941  Transition of Care [facility]) CM/SW Contact  Purcell Mouton, RN Phone Number: 02/25/2020, 12:25 PM  Clinical Narrative:    Rosalie Gums [phone] E. Daughter was updated on Facilities that offered a SNF bed.   Expected Discharge Plan: [place] Barriers to Discharge: No Barriers Identified  Expected Discharge Plan and Services Expected Discharge Plan: [place]   Discharge Planning Services: CM Consult   Living arrangements for the past 2 months: Single Family Home                                       Social Determinants of Health (SDOH) Interventions    Readmission Risk Interventions Readmission Risk Prevention Plan 01/06/2020  Transportation Screening Complete  Medication Review Press photographer) Complete  Some recent data might be hidden

## 2020-02-25 NOTE — Discharge Summary (Signed)
Physician Discharge Summary  Anthony Curry. BSJ:[id number] DOB: 07/05/1941 DOA: 02/17/2020  PCP: [name], MD  Admit date: 02/17/2020 Discharge date: 02/25/2020  Admitted From: Home Discharge disposition: SNF   Code Status: Full Code  Diet Recommendation: Cardiac diet   Recommendations for Outpatient Follow-Up:   1. Follow-up with PCP as an outpatient 2. Follow-up with orthopedic surgery as an outpatient  Discharge Diagnosis:   Principal Problem:   Left displaced femoral neck fracture (HCC) Active Problems:   Hyponatremia   BPH (benign prostatic hyperplasia)   GERD (gastroesophageal reflux disease)   Fall   Physical deconditioning   Hypoalbuminemia   Malignant neoplasm of lower lobe of right lung (HCC)   Atrial fibrillation (HCC)   Hypertension   SIADH (syndrome of inappropriate ADH production) (HCC)   Diverticulitis   Pericardial effusion   Thromboembolism (HCC)   Hip fracture (HCC)   History of Present Illness / Brief narrative:  Patient is a 79 y.o.malewith history h/oStage IV (T1a, N0, M1b)non small cell RLL lung CA followed by Dr Claudette Stapler A. fib, SIADH, hypothyroidism, depression, anxiety.  Patient was recentlyhospitalized from1/28-2/5/21for perforated diverticulitis and underwent sigmoid colectomy with colostomy creation, hospital course complicated byCovid 19 pneumonia, urinary retention requiring indwelling foley,ischemic left hand requiringleft brachial embolectomy on 2/1by vascular surgery ( Dr Oneida Alar), on Eliquis since then. He was discharged to SNF but states was not getting enough help there, felt miserable and couldn't stay there land left home the following day. He lost 15-20 pounds weight since last admission. On 3/15, he had a mechanical fall leading to left hip pain and has presented to the ED.  In the ED, patient was afebrileand hemodynamically stable.   Hip x-ray showed left femoral neck angulated  fracture, Orthopedics was consulted and patient was admitted to hospital.  Hospital Course:  Left Femoral neck fracture -s/pleft hip hemiarthroplasty by orthopedics on 02/18/2020.  -PT has seen the patientand patient has been recommended skilled nursing facility placement for rehab. -Patient is already on Eliquis for A. fib that works as DVT prophylaxis.   Aspiration pneumonia Postoperatively, patient had fever, atelectasis. -Chest x-ray 3/18 - new retrocardiac opacity- atelectasis versus infiltrate. -Chest xray 3/20 - persistent left basilar consolidation -Augmentin was started with clinical improvement. Continues to have productive cough.  -Repeat chest x-ray on 3/22 showed slightly increased atelectasis and/or consolidation at the left lung Base. Patient is clinically improving. Continue breathing treatment. Continue Mucinex.    Recent complicated diverticulitis: s/p surgery and is s/p colostomy on oral diet. Continue ostomy care.  Stage IV non-small cell lung cancer:Patientfollows Dr. Earlie Server and receives immunotherapy as outpatient.Continue mucolytics and bronchodilators.   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 Patient was previously evaluatedby cardiologywith impression of moderate pericardial effusion likely due to lung CA and recommended continuation of anticoagulation. Patient had repeat echocardiogram on 2/3 with no tamponade physiology. Patient was seen by cardiology this time. Patient will have to follow-up with cardiology as outpatient after discharge.  Paroxysmal atrial fibrillation: Had rapid ventricular responseduring hospitalization. -Cardiology consultation obtained.  Rate control achieved. Currently on Eliquis and Cardizem.   Urinary retention:Had foley catheterplaced in last admission and had failed voiding trial on2/4- Continue Flomax,finasteride.Patient is supposed to follow-up with urology as outpatient for  addressing his Foley catheter.  SIADH/chronic hyponatremia:Likely secondary to lung cancer.On demeclocycline. Will continue. Sodium of 135  Hypothyroidism: Continue Synthroid  Hypomagnesemia.Continue magnesium replacement  Borderline hyperkalemia -Potassium down to normal to 4.8 from 5.3 without any intervention.  History  of COPD/Emphysema/recent COVID-19 pneumonia:Chest x-ray with new retrocardiac density. With mild grade fever and leukocytosis will start on Augmentin. On nasal cannula oxygen at 2 L/min.  GERD: Continue PPI  GeneralizedDeconditioning/debilitywith protein calorie malnutrition:Patient was discharged to skilled nursing facility in the last admission but decided to go home after that.PT recommends discharge to SNF this admission. Patient agreeable for discharge to SNF.  Okay to discharge to SNF today.  Subjective:  Seen and examined this morning.  Pleasant, elderly male.  Propped up in bed.  Not in distress.  Not on oxygen supplementation.  Discharge Exam:   Vitals:   02/24/20 0645 02/24/20 1200 02/24/20 1405 02/24/20 2119  BP: (!) 101/54  (!) 108/58 122/61  Pulse: 95 98 100 84  Resp: 18  20 20   Temp: 98.5 F (36.9 C)  98.7 F (37.1 C) 98.4 F (36.9 C)  TempSrc: Oral  Oral Oral  SpO2: 96%  98% 96%  Weight:      Height:        Body mass index is 17.62 kg/m.  General exam: Appears calm and comfortable. Skin: No rashes, lesions or ulcers. HEENT: Atraumatic, normocephalic, supple neck, no obvious bleeding Lungs: Clear to auscultation bilaterally CVS: Regular rate and rhythm, no murmur GI/Abd soft, nontender, nondistended, bowel sound present CNS: Alert, awake, oriented x3 Psychiatry: Mood appropriate Extremities: No pedal edema, no calf tenderness  Discharge Instructions:  Wound care: None Discharge Instructions    Diet - low sodium heart healthy   Complete by: As directed    Increase activity slowly   Complete by: As directed        Contact information for follow-up providers    Marchia Bond, MD. Schedule an appointment as soon as possible for a visit in 2 weeks.   Specialty: Orthopedic Surgery Contact information: [address] [place] [address] [phone]        Geralynn Rile, MD Follow up on 02/26/2020.   Specialties: Internal Medicine, Cardiology, Radiology Why: Hospital follow-up March 24th at 9:00 AM Contact information: [place] [address] [phone]            Contact information for after-discharge care    [facility] .   Service: Skilled Nursing Contact information: [address] [place] [phone]                 Allergies as of 02/25/2020   No Known Allergies     Medication List    STOP taking these medications   ALPRAZolam 0.5 MG tablet Commonly known as: XANAX   levofloxacin 500 MG tablet Commonly known as: LEVAQUIN     TAKE these medications   Acidophilus Caps capsule Take 1 capsule by mouth daily.   amoxicillin-clavulanate 875-125 MG tablet Commonly known as: AUGMENTIN Take 1 tablet by mouth 2 (two) times daily for 5 days.   benzonatate 200 MG capsule Commonly known as: TESSALON Take 1 capsule (200 mg total) by mouth 3 (three) times daily as needed for cough.   bisacodyl 10 MG suppository Commonly known as: DULCOLAX Place 1 suppository (10 mg total) rectally daily as needed for moderate constipation.   demeclocycline 150 MG tablet Commonly known as: DECLOMYCIN Take 2 tablets twice a day What changed:   how much to take  how to take this  when to take this  additional instructions   diltiazem 180 MG 24 hr capsule Commonly known as: CARDIZEM CD Take  1 capsule (180 mg total) by mouth daily at 12 noon.   Eliquis 5 MG Tabs tablet Generic drug: apixaban Take 5 mg by mouth 2 (two) times daily.   ENSURE COMPLETE PO Take 1 Can  by mouth 2 (two) times daily.   ferrous sulfate 325 (65 FE) MG tablet Take 1 tablet (325 mg total) by mouth 3 (three) times daily after meals.   finasteride 5 MG tablet Commonly known as: PROSCAR Take 1 tablet (5 mg total) by mouth daily.   FISH OIL PO Take 1,000 mg by mouth daily.   fluticasone 50 MCG/ACT nasal spray Commonly known as: FLONASE Place 2 sprays into both nostrils at bedtime.   guaiFENesin 600 MG 12 hr tablet Commonly known as: MUCINEX Take 1 tablet (600 mg total) by mouth 2 (two) times daily.   HYDROcodone-acetaminophen 5-325 MG tablet Commonly known as: NORCO/VICODIN Take 1 tablet by mouth every 6 (six) hours as needed for up to 3 days for moderate pain (pain score 4-6). What changed:   when to take this  reasons to take this   Klor-Con M20 20 MEQ tablet Generic drug: potassium chloride SA Take 20 mEq by mouth daily at 12 noon.   levalbuterol 0.63 MG/3ML nebulizer solution Commonly known as: XOPENEX Take 3 mLs (0.63 mg total) by nebulization every 6 (six) hours as needed for wheezing or shortness of breath.   levothyroxine 50 MCG tablet Commonly known as: SYNTHROID TAKE 1 TABLET (50 MCG TOTAL) BY MOUTH DAILY BEFORE BREAKFAST.   MAGNESIUM PO Take 400 mg by mouth daily.   mirtazapine 30 MG tablet Commonly known as: REMERON Take 1 tablet (30 mg total) by mouth at bedtime for 5 days.   OPDIVO IV Inject as directed every 30 (thirty) days. Receives 480mg  every 28 days.   OVER THE COUNTER MEDICATION Take 10 mLs by mouth daily. Over the counter liquid multivitamin   pantoprazole 40 MG tablet Commonly known as: PROTONIX Take 1 tablet (40 mg total) by mouth daily. Start taking on: February 26, 2020   tamsulosin 0.4 MG Caps capsule Commonly known as: FLOMAX Take 1 capsule (0.4 mg total) by mouth 2 (two) times daily.   thiamine 100 MG tablet Commonly known as: VITAMIN B-1 Take 1 tablet (100 mg total) by mouth daily.       Time coordinating  discharge: 35 minutes  The results of significant diagnostics from this hospitalization (including imaging, microbiology, ancillary and laboratory) are listed below for reference.    Procedures and Diagnostic Studies:   DG Chest 1 View  Result Date: 02/17/2020 CLINICAL DATA:  Cough for the past week. EXAM: CHEST  1 VIEW COMPARISON:  CT chest dated January 23, 2020. Chest x-ray dated January 05, 2020. FINDINGS: The heart size and mediastinal contours are within normal limits. Atherosclerotic calcification of the aortic arch. Normal pulmonary vascularity. The lungs are hyperinflated with emphysematous changes. No focal consolidation, pleural effusion, or pneumothorax. No acute osseous abnormality. Old bilateral rib fractures and right proximal humerus fracture again noted. Prior left total shoulder arthroplasty. IMPRESSION: 1. No active disease. 2. COPD. Electronically Signed   By: Titus Dubin M.D.   On: 02/17/2020 13:39   DG Hip Port Unilat With Pelvis 1V Left  Result Date: 02/18/2020 CLINICAL DATA:  Status post total left hip replacement. EXAM: DG HIP (WITH OR WITHOUT PELVIS) 1V PORT LEFT COMPARISON:  February 17, 2020 FINDINGS: There is no evidence of an acute hip fracture or dislocation. A stable 1.8 cm x 0.8  cm sclerotic focus is seen just below the left acetabulum. A left hip replacement is seen without evidence of surrounding lucency to suggest the presence of hardware loosening or infection. Marked severity degenerative changes seen within the visualized portion of the lower lumbar spine. Soft tissue structures are unremarkable. IMPRESSION: 1. Status post left hip replacement without evidence of hardware loosening or infection. Electronically Signed   By: Virgina Norfolk M.D.   On: 02/18/2020 19:48   DG Hip Unilat With Pelvis 2-3 Views Left  Result Date: 02/17/2020 CLINICAL DATA:  Fall, hip pain EXAM: DG HIP (WITH OR WITHOUT PELVIS) 2-3V LEFT COMPARISON:  None. FINDINGS: Fracture of the  LEFT femoral neck with varus angulation. No dislocation. Sclerotic lesion in the LEFT inferior pubic ramus measuring 2 cm appears benign on comparison CT 01/05/2020 IMPRESSION: LEFT femoral neck fracture. Electronically Signed   By: Suzy Bouchard M.D.   On: 02/17/2020 13:37     Labs:   Basic Metabolic Panel: Recent Labs  Lab 02/19/20 0314 02/19/20 0314 02/20/20 0415 02/20/20 0415 02/21/20 0409 02/21/20 0409 02/22/20 0335 02/24/20 0347  NA 131*  --  132*  --  136  --  135 132*  K 3.9   < > 4.1   < > 5.3*   < > 4.8 4.7  CL 101  --  102  --  104  --  98 98  CO2 22  --  25  --  28  --  28 28  GLUCOSE 115*  --  152*  --  117*  --  105* 96  BUN 16  --  22  --  29*  --  30* 24*  CREATININE 0.74  --  0.50*  --  0.58*  --  0.49* 0.61  CALCIUM 8.1*  --  8.3*  --  8.8*  --  8.4* 8.1*  MG 1.4*  --  1.8  --  1.9  --  1.7 1.6*   < > = values in this interval not displayed.   GFR Estimated Creatinine Clearance: 59.7 mL/min (by C-G formula based on SCr of 0.61 mg/dL). Liver Function Tests: No results for input(s): AST, ALT, ALKPHOS, BILITOT, PROT, ALBUMIN in the last 168 hours. No results for input(s): LIPASE, AMYLASE in the last 168 hours. No results for input(s): AMMONIA in the last 168 hours. Coagulation profile No results for input(s): INR, PROTIME in the last 168 hours.  CBC: Recent Labs  Lab 02/21/20 0409 02/22/20 0335 02/23/20 0345 02/24/20 0347 02/25/20 0419  WBC 13.1* 11.3* 12.0* 8.9 7.8  HGB 10.2* 10.2* 10.7* 9.9* 9.3*  HCT 30.8* 31.3* 32.8* 30.5* 28.2*  MCV 92.2 91.8 93.7 93.3 93.1  PLT 270 308 347 332 347   Cardiac Enzymes: No results for input(s): CKTOTAL, CKMB, CKMBINDEX, TROPONINI in the last 168 hours. BNP: Invalid input(s): POCBNP CBG: No results for input(s): GLUCAP in the last 168 hours. D-Dimer No results for input(s): DDIMER in the last 72 hours. Hgb A1c No results for input(s): HGBA1C in the last 72 hours. Lipid Profile No results for input(s):  CHOL, HDL, LDLCALC, TRIG, CHOLHDL, LDLDIRECT in the last 72 hours. Thyroid function studies No results for input(s): TSH, T4TOTAL, T3FREE, THYROIDAB in the last 72 hours.  Invalid input(s): FREET3 Anemia work up No results for input(s): VITAMINB12, FOLATE, FERRITIN, TIBC, IRON, RETICCTPCT in the last 72 hours. Microbiology Recent Results (from the past 240 hour(s))  Surgical pcr screen     Status: Abnormal   Collection Time: 02/17/20  10:52 PM   Specimen: Nasal Mucosa; Nasal Swab  Result Value Ref Range Status   MRSA, PCR NEGATIVE NEGATIVE Final   Staphylococcus aureus POSITIVE (A) NEGATIVE Final    Comment: (NOTE) The Xpert SA Assay (FDA approved for NASAL specimens in patients 59 years of age and older), is one component of a comprehensive surveillance program. It is not intended to diagnose infection nor to guide or monitor treatment. Performed at [facility], [place] [address] [place],  [address]     Please note: You were cared for by a hospitalist during your hospital stay. Once you are discharged, your primary care physician will handle any further medical issues. Please note that NO REFILLS for any discharge medications will be authorized once you are discharged, as it is imperative that you return to your primary care physician (or establish a relationship with a primary care physician if you do not have one) for your post hospital discharge needs so that they can reassess your need for medications and monitor your lab values.  Signed: Terrilee Croak  [facility] 02/25/2020, 1:34 PM

## 2020-02-25 NOTE — Progress Notes (Signed)
Physical Therapy Treatment Patient Details Name: Anthony Curry. MRN: [id number] DOB: Jun 21, 1941 Today's Date: 02/25/2020    History of Present Illness Patient is 79 y.o. male s/p fall at home on 02/17/20 while transfering with RW. Pt found to have displaced left femoral neck fracture and now s/p Left hip hemiarthroplasty on 02/18/20.  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    PT Comments    Patient making gradual progress with therapy and remains limited by fatigue, weakness, and SOB with activity. Pt remained on 2L/min during session and SpO2 was =/>96% throughout session. Pt reported dizziness with standing however BP remained 123/104 with sit<>stand transfer and following gait. Pt HR continues to be elevated with transfers and short bouts of activity reaching 143 bpm as max and lingering in 120's-130's with mobility. Greater distance for gait deferred due to increased HR and pt completed exercises in seated position for ROM and strengthening. Patient will continue to benefit from skilled PT interventions to progress mobility as able.   Follow Up Recommendations  SNF     Equipment Recommendations  None recommended by PT    Recommendations for Other Services       Precautions / Restrictions Precautions Precautions: Posterior Hip;Fall Precaution Comments: monitor HR; reviewed posterior hip precautions pt verbally recalled 3/3 Restrictions Weight Bearing Restrictions: No    Mobility  Bed Mobility Overal bed mobility: Needs Assistance Bed Mobility: Supine to Sit      Supine to sit: Min assist;HOB elevated     General bed mobility comments: pt with improved initiattion for bed mobility with walking LE's to EOB and raising trunk upright, pt requried cues to deter hip flexion past 90* as he has tendency to lean trunk forward. assist required to scoot to EOB and get feet on floor.  Transfers Overall transfer level: Needs assistance Equipment used: Bilateral platform walker Transfers: Sit to/from Stand;Stand Pivot Transfers Sit to Stand: From elevated surface;+2 safety/equipment;+2 physical assistance;Min assist;Mod assist Stand pivot transfers: Min assist;+2 physical assistance;+2 safety/equipment       General transfer comment: elevated EOB required for safety to maintain posterior hip precautions. EVA walker used this session and cues for safe hand placement provided and min-mod assist to initiate power up and steady with rising. Pt performed 2x from EOB due to dizziness in standing and SOB after first transfer that did not improve over 30-60 seconds. Pt requried min assist to  manage EVA walker safely during stand step/pivot transfer. Pt's HR fluctuatting between 120's-143 bpm with transfers.   Ambulation/Gait Ambulation/Gait assistance: Min assist;Mod assist;+2 physical assistance;+2 safety/equipment Gait Distance (Feet): 8 Feet Assistive device: Bilateral platform walker(EVA) Gait Pattern/deviations: Step-to pattern;Decreased stride length;Decreased stance time - left;Trunk flexed Gait velocity: decreased   General Gait Details: Pt remains very weak. HR decreased from 143 bpm to low 120's after stand pivot transfer. Pt abel to take short steps forward and backward in room prior to requiring seated rest due to SOB. HR returned to 110's after sittign for ~1 minute.   Stairs        Wheelchair Mobility    Modified Rankin (Stroke Patients Only)       Balance  Overall balance assessment: History of Falls;Needs assistance Sitting-balance  support: Feet supported Sitting balance-Leahy Scale: Fair     Standing balance support: During functional activity;Bilateral upper extremity supported Standing balance-Leahy Scale: Poor           Cognition Arousal/Alertness: Awake/alert Behavior During Therapy: WFL for tasks assessed/performed Overall Cognitive Status: Within Functional Limits for tasks assessed       Exercises Total Joint Exercises Ankle Circles/Pumps: AROM;Seated;Both;15 reps Quad Sets: AROM;Left;10 reps;Seated Heel Slides: AROM;Left;10 reps;Seated(cues to deter hip flexion past 90*) Hip ABduction/ADduction: AROM;Left;10 reps;Seated Other Exercises Other Exercises: Incentive Spirometer: educated pt on technique for breathing with spirometer, set goals for 1100 on device and instructed pt through 10 reps. Encouraged to perform 10 reps/hour to help with clearing secretions and improving SOB.    General Comments        Pertinent Vitals/Pain Pain Assessment: No/denies pain       Prior Function            PT Goals (current goals can now be found in the care plan section) Acute Rehab PT Goals Patient Stated Goal: pt and wife expressing desire to return home PT Goal Formulation: With patient Time For Goal Achievement: 03/04/20 Potential to Achieve Goals: Fair Progress towards PT goals: Progressing toward goals(slowly, limited by SOB)    Frequency    Min 3X/week      PT Plan Current plan remains appropriate       AM-PAC PT "6 Clicks" Mobility   Outcome Measure  Help needed turning from your back to your side while in a flat bed without using bedrails?: A Little Help needed moving from lying on your back to sitting on the side of a flat bed without using bedrails?: A Little Help needed moving to and from a bed to a chair (including a wheelchair)?: A Lot Help needed standing up from a chair using your arms (e.g., wheelchair or bedside chair)?: A Lot Help needed to walk in hospital room?: A  Lot Help needed climbing 3-5 steps with a railing? : Total 6 Click Score: 13    End of Session Equipment Utilized During Treatment: Gait belt;Oxygen Activity Tolerance: Patient limited by fatigue;Patient limited by pain Patient left: in chair;with call bell/phone within reach;with chair alarm set;with family/visitor present Nurse Communication: Mobility status(pt maybenefit from accapella device) PT Visit Diagnosis: Muscle weakness (generalized) (M62.81);Difficulty in walking, not elsewhere classified (R26.2)     Time: 1132-1204 PT Time Calculation (min) (ACUTE ONLY): 32 min  Charges:  $Therapeutic Exercise: 8-22 mins $Therapeutic Activity: 8-22 mins                     Verner Mould, DPT Physical Therapist with [facility] [phone]  02/25/2020 12:31 PM

## 2020-02-25 NOTE — Progress Notes (Signed)
Subjective: 7 Days Post-Op s/p Procedure(s): LEFT HIP HEMIARTHROPLASTY   Patient is alert, oriented, laying in bed. He reports left hip pain was well controlled overnight. He had a long coughing fit overnight which was his largest complaint.    Objective:  PE: VITALS:   Vitals:   02/24/20 0645 02/24/20 1200 02/24/20 1405 02/24/20 2119  BP: (!) 101/54  (!) 108/58 122/61  Pulse: 95 98 100 84  Resp: 18  20 20   Temp: 98.5 F (36.9 C)  98.7 F (37.1 C) 98.4 F (36.9 C)  TempSrc: Oral  Oral Oral  SpO2: 96%  98% 96%  Weight:      Height:        ABD soft Neurovascular intact Sensation intact distally Intact pulses distally Dorsiflexion/Plantar flexion intact Incision: scant drainage  LABS  Results for orders placed or performed during the hospital encounter of 02/17/20 (from the past 24 hour(s))  CBC     Status: Abnormal   Collection Time: 02/25/20  4:19 AM  Result Value Ref Range   WBC 7.8 4.0 - 10.5 K/uL   RBC 3.03 (L) 4.22 - 5.81 MIL/uL   Hemoglobin 9.3 (L) 13.0 - 17.0 g/dL   HCT 28.2 (L) 39.0 - 52.0 %   MCV 93.1 80.0 - 100.0 fL   MCH 30.7 26.0 - 34.0 pg   MCHC 33.0 30.0 - 36.0 g/dL   RDW 15.5 11.5 - 15.5 %   Platelets 347 150 - 400 K/uL   nRBC 0.0 0.0 - 0.2 %   *Note: Due to a large number of results and/or encounters for the requested time period, some results have not been displayed. A complete set of results can be found in Results Review.    DG Chest Port 1 View  Result Date: 02/24/2020 CLINICAL DATA:  Dyspnea after hip arthroplasty. EXAM: PORTABLE CHEST 1 VIEW COMPARISON:  Chest x-rays dated 02/22/2020 and 02/20/2020 and CT scan dated 01/23/2020 FINDINGS: There is increased atelectasis and/or consolidation at the left lung base posterior medially. The patient has extensive parenchymal scarring bilaterally with emphysematous changes in the upper lobes. The heart size and pulmonary vascularity are normal. Aortic atherosclerosis. Multiple old right rib  fractures. Previous right proximal humerus fracture. Left total shoulder prosthesis. IMPRESSION: Slightly increased atelectasis and/or consolidation at the left lung base. Aortic Atherosclerosis (ICD10-I70.0) and Emphysema (ICD10-J43.9). Electronically Signed   By: Lorriane Shire M.D.   On: 02/24/2020 11:36    Assessment/Plan: Principal Problem:   Left displaced femoral neck fracture (HCC) Active Problems:   Hyponatremia   BPH (benign prostatic hyperplasia)   GERD (gastroesophageal reflux disease)   Fall   Physical deconditioning   Hypoalbuminemia   Malignant neoplasm of lower lobe of right lung (HCC)   Atrial fibrillation (HCC)   Hypertension   SIADH (syndrome of inappropriate ADH production) ([place])   Diverticulitis   Pericardial effusion   Thromboembolism (HCC)   Hip fracture (HCC)   Left femoral neck fracture: 7 Days Post-Op s/p Procedure(s): LEFT HIP HEMIARTHROPLASTY Weightbearing:WBAT LLE, up with therapy. He is making slow progress due to deconditioning. Insicional and dressing care:PRN dressing changes, dressing changed this morning VTE prophylaxis:Eliquis, SCDs Pain control:continue current pain control regimen, patient can use Norco 10 for severe pain if needed Follow - up plan:Follow-up with Dr. Mardelle Matte 2 weeks after discharge Dispo:PT recommending SNF, TOC consult placed.   Contact information:   Weekdays 8-[address], [place] [phone] A fter hours and holidays please check [email or web address] for group call  information for [facility]  Ventura Bruns 02/25/2020, 7:23 AM

## 2020-02-25 NOTE — TOC Progression Note (Signed)
Transition of Care (TOC) - Progression Note    Patient Details  Name: Anthony Curry. MRN: [id number] Date of Birth: [birth year]  Transition of Care [facility]) CM/SW Contact  Joaquin Courts, RN Phone Number: 02/25/2020, 2:25 PM  Clinical Narrative:    CM received call from patients daughter with concern that facility may not have the type of ostomy bag her dad needs.  Daughter reports patient uses [employer] [phone] one-piece convex pouch with a velcro closure as well as roll on skin protectant and powder.  CM forwarded this information to facility rep to ensure that that proper bags are obtained for patient.  CM also reached out to bedside RN with request to have 2-3 bags along with all the change supplies used in hospital in patient's room sent with patient to facility.     Expected Discharge Plan: [place] Barriers to Discharge: No Barriers Identified  Expected Discharge Plan and Services Expected Discharge Plan: [place]   Discharge Planning Services: CM Consult   Living arrangements for the past 2 months: Single Family Home Expected Discharge Date: 02/25/20                                     Social Determinants of Health (SDOH) Interventions    Readmission Risk Interventions Readmission Risk Prevention Plan 01/06/2020  Transportation Screening Complete  Medication Review [employer]) Complete  Some recent data might be hidden

## 2020-02-25 NOTE — TOC Progression Note (Signed)
Transition of Care (TOC) - Progression Note    Patient Details  Name: Caius Silbernagel. MRN: [id number] Date of Birth: 12-08-1940  Transition of Care [facility]) CM/SW Contact  Joaquin Courts, RN Phone Number: 02/25/2020, 1:56 PM  Clinical Narrative:    CM confirmed facility choice with daughter who selects [facility].  CM spoke with facility rep and confirmed facility has a bed available for patient today.   Patient will discharge to bed 302. [facility] transportation arranged. Bedside RN please call report to [phone].    Expected Discharge Plan: [place] Barriers to Discharge: No Barriers Identified  Expected Discharge Plan and Services Expected Discharge Plan: [place]   Discharge Planning Services: CM Consult   Living arrangements for the past 2 months: Single Family Home Expected Discharge Date: 02/25/20                                     Social Determinants of Health (SDOH) Interventions    Readmission Risk Interventions Readmission Risk Prevention Plan 01/06/2020  Transportation Screening Complete  Medication Review Press photographer) Complete  Some recent data might be hidden

## 2020-02-25 NOTE — Progress Notes (Signed)
Report called to nurse at [facility]. Discharge instructions reviewed with patient. 3 extra colostomy bags sent with patient to SNF. VSS. No Concerns at this time.

## 2020-02-26 ENCOUNTER — Ambulatory Visit: Payer: Medicare Other | Admitting: Cardiovascular Disease

## 2020-02-26 DIAGNOSIS — S72002A Fracture of unspecified part of neck of left femur, initial encounter for closed fracture: Secondary | ICD-10-CM | POA: Diagnosis not present

## 2020-02-26 DIAGNOSIS — C3490 Malignant neoplasm of unspecified part of unspecified bronchus or lung: Secondary | ICD-10-CM | POA: Diagnosis not present

## 2020-02-26 DIAGNOSIS — I313 Pericardial effusion (noninflammatory): Secondary | ICD-10-CM | POA: Diagnosis not present

## 2020-02-26 DIAGNOSIS — J690 Pneumonitis due to inhalation of food and vomit: Secondary | ICD-10-CM | POA: Diagnosis not present

## 2020-02-28 ENCOUNTER — Telehealth: Payer: Self-pay | Admitting: Medical Oncology

## 2020-02-28 DIAGNOSIS — J189 Pneumonia, unspecified organism: Secondary | ICD-10-CM | POA: Diagnosis not present

## 2020-02-28 NOTE — Telephone Encounter (Signed)
Message sent to scheduling for infusion on3/30.

## 2020-02-28 NOTE — Telephone Encounter (Signed)
F/U cancer tx-Dtr asking for f/u appt with [name] to see what are the next steps for Anthony Curry. She said Anthony Curry feels like we have 'given up on him" . I reminded her that when I last spoke to him , he said he would call for an appt. when he felt he was ready.  03/03/20 labs at 1030 then 1130 with Cassie.-Appts confirmed with Helene Kelp at [facility] in [place].  Schedule message sent.  Dtr aware of appts. Dtr will accompany pt.

## 2020-03-02 ENCOUNTER — Emergency Department (HOSPITAL_COMMUNITY)
Admission: EM | Admit: 2020-03-02 | Discharge: 2020-03-02 | Disposition: A | Discharge status: Home / Self Care | Payer: Medicare Other | Attending: Emergency Medicine | Admitting: Emergency Medicine

## 2020-03-02 ENCOUNTER — Other Ambulatory Visit: Payer: Self-pay

## 2020-03-02 ENCOUNTER — Encounter (HOSPITAL_COMMUNITY): Payer: Self-pay

## 2020-03-02 DIAGNOSIS — Z87891 Personal history of nicotine dependence: Secondary | ICD-10-CM | POA: Insufficient documentation

## 2020-03-02 DIAGNOSIS — R300 Dysuria: Secondary | ICD-10-CM | POA: Insufficient documentation

## 2020-03-02 DIAGNOSIS — Z96612 Presence of left artificial shoulder joint: Secondary | ICD-10-CM | POA: Diagnosis not present

## 2020-03-02 DIAGNOSIS — I10 Essential (primary) hypertension: Secondary | ICD-10-CM | POA: Insufficient documentation

## 2020-03-02 DIAGNOSIS — Z85118 Personal history of other malignant neoplasm of bronchus and lung: Secondary | ICD-10-CM | POA: Diagnosis not present

## 2020-03-02 HISTORY — DX: Unspecified fracture of unspecified femur, initial encounter for closed fracture: S72.90XA

## 2020-03-02 HISTORY — DX: Retention of urine, unspecified: R33.9

## 2020-03-02 HISTORY — DX: Unspecified protein-calorie malnutrition: E46

## 2020-03-02 LAB — URINALYSIS, ROUTINE W REFLEX MICROSCOPIC
Bilirubin Urine: NEGATIVE
Glucose, UA: NEGATIVE mg/dL
Ketones, ur: NEGATIVE mg/dL
Nitrite: NEGATIVE
Protein, ur: NEGATIVE mg/dL
Specific Gravity, Urine: 1.006 (ref 1.005–1.030)
WBC, UA: >50 WBC/hpf — ABNORMAL HIGH (ref 0–5)
pH: 8 (ref 5.0–8.0)

## 2020-03-02 MED ORDER — CEPHALEXIN 500 MG PO CAPS
500.0000 mg | ORAL_CAPSULE | Freq: Once | ORAL | Status: AC
  Administered 2020-03-02: 500 mg via ORAL
  Filled 2020-03-02: qty 1

## 2020-03-02 MED ORDER — CEPHALEXIN 500 MG PO CAPS: 500.0000 mg | ORAL_CAPSULE | Freq: Three times a day (TID) | ORAL | 0 refills | Status: AC

## 2020-03-02 NOTE — Progress Notes (Signed)
[facility] OFFICE PROGRESS NOTE  [name], MD [place] [address]  DIAGNOSIS: Stage IV (T1a, N0, M1b) non-small cell lung cancer, adenocarcinoma of the right lower lobe diagnosed in October 2014  Molecular profile: Negative forRET, ALK, BRAF, MET, EGFR.  Positive for ERBB2 A775T, OBS9G283M, KRAS G13E, MAP2K1 D67N (see full report).  PRIOR THERAPY: 1) Systemic chemotherapy with carboplatin for AUC of 5 and Alimta 500 mg/M2 every 3 weeks, status post 6 cycles with stable disease. First dose on 10/23/2013. 2) Maintenance systemic chemotherapy with single agent Alimta 500 mg/M2 every 3 weeks. Status post 8 cycles.  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 Sondra Come completed on 12/16/2014. 4) stereotactic radiotherapy to the enlarging right upper lobe lung nodule under the care of Dr. Sondra Come completed on 11/22/2016. 5) Immunotherapy with Nivolumab 240 mg IV every 2 weeks, status post 60 cycles. First cycle was given on 01/28/2015. Last dose 07/05/2017.  CURRENT THERAPY: Immunotherapy with Nivolumab 480 mg IV every 4 weeks. First dose 07/19/2017. Status post 31 cycles.  INTERVAL HISTORY: Demari Gales. 79 y.o. male returns to the clinic today for a follow-up visit accompanied by his daughter and wife.  The patient's last infusion with nivolumab was on 12/02/2019.  Since that time, the patient has had a challenging time over the course of the last few months and his treatment has been on hold due to several events.  In January 2021, the patient had developed acute perforated diverticulitis status post ex lap and sigmoid resection and colostomy.  His hospitalization was complicated by COVID pneumonia, acute ischemia of the left hand status post embolectomy, and urinary retention (currently still has foley catheter, follows up with urology 4/1).  The patient had a restaging CT scan in February 2021 which  showed no evidence of disease progression.  It was determined at that time that the patient would resume treatment with immunotherapy with nivolumab once he had improvement in his general condition.  Since that appointment, the patient had presented to the emergency room on 01/30/2020 for the chief complaint of epididymitis.  The patient then had a mechanical fall on 02/17/2020 and had a left displaced femoral neck fracture status post left hip hemiarthroplasty on 02/18/2020.  The patient is currently residing in [facility] in [place] for rehab. Yesterday, the patient was seen in the ER for burning near his catheter site. He was given a prescription for Keflex. The patient had a voiding trial in February and failed. He is scheduled to see urology 4/1 for another voiding trial.   Today, the patient is feeling unwell today. He expresses concern about the rehab facility that he is in. He does not know his anticipated discharge date. His daughter is going to try to get him home for outpatient rehab. He is concerned that if he receives treatment today, he will be too fatigued for rehab and it will delay his discharge date. He denies any fever, chills, or night sweats.  He reports weight gail over the last few weeks.  He denies any chest pain or hemoptysis.  He was found to have atelectasis versus infiltrates on chest x-rays performed during his most recent hospitalization.  He was treated with Augmentin per chart review.  Reports shortness of breath with exertion when he is doing his rehab exercises. He reports his baseline productive cough.  He denies nausea or vomiting.   He denies any headache or  visual changes.  The patient is here today for evaluation and to discuss resuming his treatment with nivolumab.   MEDICAL HISTORY: Past Medical History:  Diagnosis Date  . 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 Adrenal mass ([place])   . Alcoholism in remission ([place])   . Anemia of chronic disease   . Antineoplastic  chemotherapy induced anemia(285.3)   . Anxiety   . BPH (benign prostatic hyperplasia)   . Cancer ([place])    small cell/lung  . Complication of anesthesia 09/08/2015    JITTERY AFTER GALLBLADDER SURGERY  . Femur fracture ([place])   . GERD (gastroesophageal reflux disease)   . History of radiation therapy 11/14/16, 11/18/16, 11/22/16   Left upper lung: 54 Gy in 3 fractions  . Hypertension   . Hyponatremia   . Neoplastic malignant related fatigue    IV tx. every 2 weeks last9-14-16 (Dr. Earlie Server), radiation last tx. 6 months  . 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 Pericardial effusion   . Pericardial effusion   . Persistent atrial fibrillation ([place])   . Protein calorie malnutrition ([place])   . Pulmonary nodules   . Radiation 12/02/14-12/16/14   Left adrenal gland 30 Gy in 10 fractions  . Urinary retention     ALLERGIES:  has No Known Allergies.  MEDICATIONS:  Current Outpatient Medications  Medication Sig Dispense Refill  . apixaban (ELIQUIS) 5 MG TABS tablet Take 5 mg by mouth 2 (two) times daily.    . benzonatate (TESSALON) 200 MG capsule Take 1 capsule (200 mg total) by mouth 3 (three) times daily as needed for cough. 20 capsule 0  . bisacodyl (DULCOLAX) 10 MG suppository Place 1 suppository (10 mg total) rectally daily as needed for moderate constipation. 12 suppository 0  . cephALEXin (KEFLEX) 500 MG capsule Take 1 capsule (500 mg total) by mouth 3 (three) times daily for 7 days. 21 capsule 0  . demeclocycline (DECLOMYCIN) 150 MG tablet Take 2 tablets twice a day (Patient taking differently: Take 300 mg by mouth 2 (two) times daily. ) 120 tablet 2  . diltiazem (CARDIZEM CD) 180 MG 24 hr capsule Take 1 capsule (180 mg total) by mouth daily at 12 noon. 90 capsule 3  . ferrous sulfate 325 (65 FE) MG tablet Take 1 tablet (325 mg total) by mouth 3 (three) times daily after meals.  3  . finasteride (PROSCAR) 5 MG tablet Take 1 tablet (5 mg total) by mouth daily. (Patient not taking:  Reported on 02/17/2020)    . fluticasone (FLONASE) 50 MCG/ACT nasal spray Place 2 sprays into both nostrils at bedtime.     [name] guaiFENesin (MUCINEX) 600 MG 12 hr tablet Take 1 tablet (600 mg total) by mouth 2 (two) times daily.    [name] KLOR-CON M20 20 MEQ tablet Take 20 mEq by mouth daily at 12 noon.     . Lactobacillus (ACIDOPHILUS) CAPS capsule Take 1 capsule by mouth daily.    [name] levalbuterol (XOPENEX) 0.63 MG/3ML nebulizer solution Take 3 mLs (0.63 mg total) by nebulization every 6 (six) hours as needed for wheezing or shortness of breath. 3 mL 12  . levothyroxine (SYNTHROID, LEVOTHROID) 50 MCG tablet TAKE 1 TABLET (50 MCG TOTAL) BY MOUTH DAILY BEFORE BREAKFAST. 90 tablet 0  . MAGNESIUM PO Take 400 mg by mouth daily.     . mirtazapine (REMERON) 30 MG tablet Take 1 tablet (30 mg total) by mouth at bedtime for 5 days. 5 tablet 0  . Nivolumab (OPDIVO IV) Inject as  directed every 30 (thirty) days. Receives '480mg'$  every 28 days.    . Nutritional Supplements (ENSURE COMPLETE PO) Take 1 Can by mouth 2 (two) times daily.    . Omega-3 Fatty Acids (FISH OIL PO) Take 1,000 mg by mouth daily.     [name] OVER THE COUNTER MEDICATION Take 10 mLs by mouth daily. Over the counter liquid multivitamin    . pantoprazole (PROTONIX) 40 MG tablet Take 1 tablet (40 mg total) by mouth daily.    . tamsulosin (FLOMAX) 0.4 MG CAPS capsule Take 1 capsule (0.4 mg total) by mouth 2 (two) times daily. 30 capsule   . thiamine (VITAMIN B-1) 100 MG tablet Take 1 tablet (100 mg total) by mouth daily. (Patient not taking: Reported on 02/17/2020) 30 tablet 6   No current facility-administered medications for this visit.    SURGICAL HISTORY:  Past Surgical History:  Procedure Laterality Date  . ANKLE ARTHRODESIS  1995   rt fx-hardware in  . CARDIOVERSION N/A 09/29/2014   Procedure: CARDIOVERSION;  Surgeon: Josue Hector, MD;  Location: [facility] ENDOSCOPY;  Service: Cardiovascular;  Laterality: N/A;  . CHOLECYSTECTOMY  09/08/2015     laproscopic   . CHOLECYSTECTOMY N/A 09/08/2015   Procedure: LAPAROSCOPIC CHOLECYSTECTOMY WITH ATTEMPTED INTRAOPERATIVE CHOLANGIOGRAM ;  Surgeon: Fanny Skates, MD;  Location: [facility];  Service: General;  Laterality: N/A;  . COLONOSCOPY    . EMBOLECTOMY Left 01/06/2020   Procedure: Embolectomy left axillary, brachial, radial, and ulnar artery;  Surgeon: Elam Dutch, MD;  Location: [facility] OR;  Service: Vascular;  Laterality: Left;  . ERCP N/A 09/02/2015   Procedure: ENDOSCOPIC RETROGRADE CHOLANGIOPANCREATOGRAPHY (ERCP);  Surgeon: Arta Silence, MD;  Location: [facility];  Service: Endoscopy;  Laterality: N/A;  . ERCP N/A 09/07/2015   Procedure: ENDOSCOPIC RETROGRADE CHOLANGIOPANCREATOGRAPHY (ERCP);  Surgeon: Clarene Essex, MD;  Location: [facility];  Service: Endoscopy;  Laterality: N/A;  . EXCISION / CURETTAGE BONE CYST FINGER  2005   rt thumb  . HEMORRHOID SURGERY    . HIP ARTHROPLASTY Left 02/18/2020   Procedure: LEFT HIP HEMIARTHROPLASTY;  Surgeon: Marchia Bond, MD;  Location: [facility] ORS;  Service: Orthopedics;  Laterality: Left;  . LAPAROTOMY N/A 01/05/2020   Procedure: EXPLORATORY LAPAROTOMY sigmoid colon resection creation end colostomy for perforated diverticulitis and purulent peritonitis;  Surgeon: Kieth Brightly Arta Bruce, MD;  Location: [facility] ORS;  Service: General;  Laterality: N/A;  . Percutaneous Cholecytostomy tube     IVR insertion 06-13-15(Dr. Vernard Gambles)- 08-28-15 remains in place to drainage bag  . TONSILLECTOMY    . TOTAL SHOULDER ARTHROPLASTY  02/03/2012   Procedure: TOTAL SHOULDER ARTHROPLASTY;  Surgeon: [name], MD;  Location: [facility];  Service: Orthopedics;  Laterality: Left;    REVIEW OF SYSTEMS:   Review of Systems  Constitutional: Positive for fatigue and generalized weakness. Negative for appetite change, chills, fever and unexpected weight change.  HENT:Negative for mouth sores, nosebleeds, sore throat and trouble swallowing.   Eyes: Negative for eye  problems and icterus.  Respiratory: Positive for shortness of breath with exertion. Negative for  Hemoptysis and wheezing.   Cardiovascular: Negative for chest pain and leg swelling.  Gastrointestinal: Negative for abdominal pain, nausea and vomiting.  Genitourinary: Positive for burning near catheter site. Has foley catheter in. Musculoskeletal: Negative for back pain, gait problem, neck pain and neck stiffness.  Skin: Negative for itching and rash.  Neurological: Negative for dizziness, extremity weakness, gait problem, headaches, light-headedness and seizures.  Hematological: Negative for adenopathy. Does not bruise/bleed  easily.  Psychiatric/Behavioral: Negative for confusion, depression and sleep disturbance. The patient is not nervous/anxious.     PHYSICAL EXAMINATION:  There were no vitals taken for this visit.  ECOG PERFORMANCE STATUS: 1 - Symptomatic but completely ambulatory  Physical Exam  Constitutional: Oriented to person, place, and time and well-developed, well-nourished, and in no distress.   HENT:  Head: Normocephalic and atraumatic.  Mouth/Throat: Oropharynx is clear and moist. No oropharyngeal exudate.  Eyes: Conjunctivae are normal. Right eye exhibits no discharge. Left eye exhibits no discharge. No scleral icterus.  Neck: Normal range of motion. Neck supple.  Cardiovascular: Normal rate, regular rhythm, normal heart sounds and intact distal pulses.   Pulmonary/Chest: Effort normal and breath sounds normal. No respiratory distress. No wheezes. No rales.  Abdominal: Soft. Bowel sounds are normal. Exhibits no distension and no mass. There is no tenderness. Has colostomy bag.  Musculoskeletal: Normal range of motion. Exhibits no edema.  Lymphadenopathy:    No cervical adenopathy.  Neurological: Alert and oriented to person, place, and time. Exhibits normal muscle tone. Examined in wheelchair.  Skin: Skin is warm and dry. No rash noted. Not diaphoretic. No erythema. No  pallor.  Psychiatric: Mood, memory and judgment normal.  Vitals reviewed.  LABORATORY DATA: Lab Results  Component Value Date   WBC 7.8 02/25/2020   HGB 9.3 (L) 02/25/2020   HCT 28.2 (L) 02/25/2020   MCV 93.1 02/25/2020   PLT 347 02/25/2020      Chemistry      Component Value Date/Time   NA 132 (L) 02/24/2020 0347   NA 132 (L) 12/06/2017 0930   K 4.7 02/24/2020 0347   K 4.0 12/06/2017 0930   CL 98 02/24/2020 0347   CO2 28 02/24/2020 0347   CO2 26 12/06/2017 0930   BUN 24 (H) 02/24/2020 0347   BUN 15.4 12/06/2017 0930   CREATININE 0.61 02/24/2020 0347   CREATININE 0.72 01/27/2020 0911   CREATININE 0.9 12/06/2017 0930      Component Value Date/Time   CALCIUM 8.1 (L) 02/24/2020 0347   CALCIUM 9.9 12/06/2017 0930   ALKPHOS 123 02/17/2020 1407   ALKPHOS 107 12/06/2017 0930   AST 27 02/17/2020 1407   AST 21 01/27/2020 0911   AST 20 12/06/2017 0930   ALT 24 02/17/2020 1407   ALT 22 01/27/2020 0911   ALT 14 12/06/2017 0930   BILITOT 0.6 02/17/2020 1407   BILITOT 0.4 01/27/2020 0911   BILITOT 0.76 12/06/2017 0930       RADIOGRAPHIC STUDIES:  DG Chest 1 View  Result Date: 02/22/2020 CLINICAL DATA:  79 y/o pt with recent hip surgery (3/16), hx of covid, HTN, left upper lobe cancer, pericardial effusion, a-fib and pulmonary nodules. EXAM: CHEST  1 VIEW COMPARISON:  Chest radiograph 02/20/2020 FINDINGS: Stable cardiomediastinal contours. Lungs are hyperinflated. Bandlike opacities at the right lung base likely reflect atelectasis. Persistent left basilar consolidation which may reflect atelectasis or infiltrate. No pneumothorax or large pleural effusion. Multiple bilateral old rib fractures. IMPRESSION: Persistent left basilar consolidation which may reflect atelectasis or infiltrate. Bandlike opacities in the right lung base likely reflecting atelectasis. Electronically Signed   By: Audie Pinto M.D.   On: 02/22/2020 17:13   DG Chest 1 View  Result Date:  02/17/2020 CLINICAL DATA:  Cough for the past week. EXAM: CHEST  1 VIEW COMPARISON:  CT chest dated January 23, 2020. Chest x-ray dated January 05, 2020. FINDINGS: The heart size and mediastinal contours are within normal limits. Atherosclerotic calcification  of the aortic arch. Normal pulmonary vascularity. The lungs are hyperinflated with emphysematous changes. No focal consolidation, pleural effusion, or pneumothorax. No acute osseous abnormality. Old bilateral rib fractures and right proximal humerus fracture again noted. Prior left total shoulder arthroplasty. IMPRESSION: 1. No active disease. 2. COPD. Electronically Signed   By: Titus Dubin M.D.   On: 02/17/2020 13:39   DG Chest Port 1 View  Result Date: 02/24/2020 CLINICAL DATA:  Dyspnea after hip arthroplasty. EXAM: PORTABLE CHEST 1 VIEW COMPARISON:  Chest x-rays dated 02/22/2020 and 02/20/2020 and CT scan dated 01/23/2020 FINDINGS: There is increased atelectasis and/or consolidation at the left lung base posterior medially. The patient has extensive parenchymal scarring bilaterally with emphysematous changes in the upper lobes. The heart size and pulmonary vascularity are normal. Aortic atherosclerosis. Multiple old right rib fractures. Previous right proximal humerus fracture. Left total shoulder prosthesis. IMPRESSION: Slightly increased atelectasis and/or consolidation at the left lung base. Aortic Atherosclerosis (ICD10-I70.0) and Emphysema (ICD10-J43.9). Electronically Signed   By: Lorriane Shire M.D.   On: 02/24/2020 11:36   DG Chest Port 1 View  Result Date: 02/20/2020 CLINICAL DATA:  Shortness of breath this morning. EXAM: PORTABLE CHEST 1 VIEW COMPARISON:  02/17/2020 FINDINGS: Heart size is within normal limits. New opacity is seen in the retrocardiac lung base on today's study which could be due to atelectasis or infiltrate. Mild atelectasis is seen in the right lung base. No evidence of pleural effusion or pneumothorax. IMPRESSION:  1. New retrocardiac opacity, which may represent atelectasis versus infiltrate. Recommend continued chest radiographic follow-up. 2. Mild right basilar atelectasis. Electronically Signed   By: Marlaine Hind M.D.   On: 02/20/2020 08:07   DG Hip Port Unilat With Pelvis 1V Left  Result Date: 02/18/2020 CLINICAL DATA:  Status post total left hip replacement. EXAM: DG HIP (WITH OR WITHOUT PELVIS) 1V PORT LEFT COMPARISON:  February 17, 2020 FINDINGS: There is no evidence of an acute hip fracture or dislocation. A stable 1.8 cm x 0.8 cm sclerotic focus is seen just below the left acetabulum. A left hip replacement is seen without evidence of surrounding lucency to suggest the presence of hardware loosening or infection. Marked severity degenerative changes seen within the visualized portion of the lower lumbar spine. Soft tissue structures are unremarkable. IMPRESSION: 1. Status post left hip replacement without evidence of hardware loosening or infection. Electronically Signed   By: Virgina Norfolk M.D.   On: 02/18/2020 19:48   DG Hip Unilat With Pelvis 2-3 Views Left  Result Date: 02/17/2020 CLINICAL DATA:  Fall, hip pain EXAM: DG HIP (WITH OR WITHOUT PELVIS) 2-3V LEFT COMPARISON:  None. FINDINGS: Fracture of the LEFT femoral neck with varus angulation. No dislocation. Sclerotic lesion in the LEFT inferior pubic ramus measuring 2 cm appears benign on comparison CT 01/05/2020 IMPRESSION: LEFT femoral neck fracture. Electronically Signed   By: Suzy Bouchard M.D.   On: 02/17/2020 13:37     ASSESSMENT/PLAN:  This is a very pleasant 79 year old Caucasian male with metastatic non-small cell lung cancer, adenocarcinoma of the right lower lobe. He was diagnosed in October 2014.  He has no actionable mutations.   The patient is currently undergoing second line immunotherapy with nivolumab. He is status post 60 cycles of nivolumab IV every 2 weeks.  He was switched to nivolumab 480 milligrams IV every 4 weeks.  He  is status post 31 cycles. He was tolerating it well without any adverse side effects except for fatigue and weakness.  However, the  patient's treatment has been on hold since December 2022 to several surgeries and hospitalizations.  The patient was seen with Dr. [name] today.  Labs were reviewed.  Dr. [name] recommends holding the patient's treatment until improvement in his condition. The patient is concerned that he may be too weak to perform his rehab if he receives treatment. Dr. [name] recommends a restaging CT scan of the chest, abdomen, and pelvis in 1 month.   We will see him back in 4 weeks for evaluation and to review his scan results before resuming cycle #32.   For his depression and insomnia the patient will continue taking Remeron. I have sent a refill.   For his hyponatremia, the patient will continue to take demeclocycline.  His sodium level is 130 today. I have sent a refill to his pharmacy.   For his urinary retention, the patient currently has a Foley catheter.  He is currently being treated with Keflex for suspected urinary tract infection.  He has a follow-up appointment with urology 4/1  Recommend that he continue following up with them as scheduled.  The patient was advised to call immediately if he has any concerning symptoms in the interval. The patient voices understanding of current disease status and treatment options and is in agreement with the current care plan. All questions were answered. The patient knows to call the clinic with any problems, questions or concerns. We can certainly see the patient much sooner if necessary      No orders of the defined types were placed in this encounter.    Samie Reasons L Celsey Asselin, PA-C 03/02/20  ADDENDUM: Hematology/Oncology Attending: I had a face-to-face encounter with the patient today.  I recommended his care plan.  This is a very pleasant 79 years old white male with metastatic non-small cell lung cancer  diagnosed in October 2014 status post several chemotherapy regimens and he is currently undergoing treatment with immunotherapy with nivolumab 418 mg IV every 4 weeks is status post 31 cycles.  The patient has been off treatment for the last few months because of several other comorbidities that have been in the last few months including diverticulitis status post surgical resection and ostomy placement in addition to recurrent urinary tract infection and hip fracture. He is currently on a skilled nursing facility for rehabilitation. The patient continues to have increasing fatigue and weakness.  He was supposed to resume his treatment with immunotherapy today but he is still very weak to start his treatment. I had a lengthy discussion with the patient and his family today about his current condition and treatment options. I recommended for the patient to continue with his physical therapy and rehabilitation. I will arrange for the patient to have repeat CT scan of the chest, abdomen pelvis in 1 month and we will reschedule his treatment to be done after the scan. He understand that his cancer could be progressing during that time but he is too weak to start any treatment at this point. He will continue his current treatment with demeclocycline, Remeron and Norco as needed. The patient was advised to call immediately if he has any concerning symptoms in the interval.  Disclaimer: This note was dictated with voice recognition software. Similar sounding words can inadvertently be transcribed and may be missed upon review. Eilleen Kempf, MD 03/03/20

## 2020-03-02 NOTE — ED Provider Notes (Signed)
Emergency Department Provider Note   I have reviewed the triage vital signs and the nursing notes.   HISTORY  Chief Complaint buring around foley cath   HPI Anthony Curry. is a 79 y.o. male with PMH reviewed below including recent admit for femur fracture presents to the emergency department with burning around the Foley catheter site.  Symptoms began this morning.  Patient has had the Foley catheter in since earlier this year.  He failed a void trial in early February and has a urology follow-up appointment in the next 2 days.  He states that until this morning the Foley catheter had not bothered him.  He felt like the color of the urine was different yesterday and seemed a darker red type color.  He alerted staff at the rehab facility where he is currently staying but states that nothing was done.  He began having burning pain this morning which was intermittent and moderate in severity.  He took his hydrocodone and pain has resolved.  He denies fevers.  He states to his knowledge the Foley is draining well.  Denies any lower abdomen or flank/back pain. No fever or chills.    Past Medical History:  Diagnosis Date  . 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 Adrenal mass ([place])   . Alcoholism in remission ([place])   . Anemia of chronic disease   . 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 Anxiety   . BPH (benign prostatic hyperplasia)   . Cancer ([place])    small cell/lung  . Complication of anesthesia 09/08/2015    JITTERY AFTER GALLBLADDER SURGERY  . Femur fracture ([place])   . GERD (gastroesophageal reflux disease)   . History of radiation therapy 11/14/16, 11/18/16, 11/22/16   Left upper lung: 54 Gy in 3 fractions  . Hypertension   . Hyponatremia   . Neoplastic malignant related fatigue    IV tx. every 2 weeks last9-14-16 (Dr. Earlie Server), radiation last tx. 6 months  . 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 Pericardial effusion   . Pericardial effusion   . Persistent  atrial fibrillation ([place])   . Protein calorie malnutrition ([place])   . Pulmonary nodules   . Radiation 12/02/14-12/16/14   Left adrenal gland 30 Gy in 10 fractions  . Urinary retention     Patient Active Problem List   Diagnosis Date Noted  . Left displaced femoral neck fracture ([place]) 02/17/2020  . Pericardial effusion 02/17/2020  . Thromboembolism ([place]) 02/17/2020  . Hip fracture ([place]) 02/17/2020  . Diverticulitis 01/02/2020  . Acute diverticulitis 01/02/2020  . Ataxia 06/30/2019  . 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 Generalized weakness 06/28/2019  . Lactic acidosis 06/28/2019  . Abnormal urinalysis 06/28/2019  . Hypertension 12/07/2016  . 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 Acute urinary retention 09/09/2015  . Choledocholithiasis with cholecystitis 09/08/2015  . Common bile duct stone   . Encounter for antineoplastic immunotherapy 06/28/2015  . Atrial fibrillation ([place]) 06/11/2015  . Gynecomastia, male 06/10/2015  . Dizziness 05/11/2015  . Malignant neoplasm of lower lobe of right lung ([place]) 09/17/2014  . Neutropenic fever ([place]) 09/02/2014  . Neoplastic malignant related fatigue 08/13/2014  . Physical deconditioning 08/13/2014  . Hypoalbuminemia 08/13/2014  . Diarrhea 08/13/2014  . Antineoplastic chemotherapy induced anemia(285.3) 08/13/2014  . Atrial fibrillation with RVR ([place]) 07/28/2014  . HCAP (healthcare-associated pneumonia) 07/25/2014  . Alcoholism in remission ([place]) 07/25/2014  . Atrial flutter by electrocardiogram ([place]) 07/25/2014  . Swelling of limb  06/10/2014  . Lung cancer ([place]) 10/15/2013  . Hypotension 09/10/2013  . Adrenal mass ([place]) 09/09/2013  . 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 Pulmonary nodules 08/19/2013  . Anemia of chronic disease 07/03/2013  . BPH (benign prostatic hyperplasia) 07/03/2013  . GERD (gastroesophageal reflux disease) 07/03/2013  . Fall 07/03/2013  . Alcohol intoxication ([place])  07/03/2013  . 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 Alcohol abuse 10/15/2011  . Hyponatremia 10/15/2011    Past Surgical History:  Procedure Laterality Date  . ANKLE ARTHRODESIS  1995   rt fx-hardware in  . CARDIOVERSION N/A 09/29/2014   Procedure: CARDIOVERSION;  Surgeon: Josue Hector, MD;  Location[facility] ENDOSCOPY;  Service: Cardiovascular;  Laterality: N/A;  . CHOLECYSTECTOMY  09/08/2015    laproscopic   . CHOLECYSTECTOMY N/A 09/08/2015   Procedure: LAPAROSCOPIC CHOLECYSTECTOMY WITH ATTEMPTED INTRAOPERATIVE CHOLANGIOGRAM ;  Surgeon: Fanny Skates, MD;  Location: [place];  Service: General;  Laterality: N/A;  . COLONOSCOPY    . EMBOLECTOMY Left 01/06/2020   Procedure: Embolectomy left axillary, brachial, radial, and ulnar artery;  Surgeon: Elam Dutch, MD;  Location: [facility] OR;  Service: Vascular;  Laterality: Left;  . ERCP N/A 09/02/2015   Procedure: ENDOSCOPIC RETROGRADE CHOLANGIOPANCREATOGRAPHY (ERCP);  Surgeon: Arta Silence, MD;  Location: [facility] ENDOS[facility];  Service: Endoscopy;  Laterality: N/A;  . ERCP N/A 09/07/2015   Procedure: ENDOSCOPIC RETROGRADE CHOLANGIOPANCREATOGRAPHY (ERCP);  Surgeon: Clarene Essex, MD;  Location: [facility] ENDOSCOPY;  Service: Endoscopy;  Laterality: N/A;  . EXCISION / CURETTAGE BONE CYST FINGER  2005   rt thumb  . HEMORRHOID SURGERY    . HIP ARTHROPLASTY Left 02/18/2020   Procedure: LEFT HIP HEMIARTHROPLASTY;  Surgeon: Marchia Bond, MD;  Location: [facility] ORS;  Service: Orthopedics;  Laterality: Left;  . LAPAROTOMY N/A 01/05/2020   Procedure: EXPLORATORY LAPAROTOMY sigmoid colon resection creation end colostomy for perforated diverticulitis and purulent peritonitis;  Surgeon: Kieth Brightly Arta Bruce, MD;  Location: [facility] ORS;  Service: General;  Laterality: N/A;  . Percutaneous Cholecytostomy tube     IVR insertion 06-13-15(Dr. Vernard Gambles)- 08-28-15 remains in place to drainage bag  . TONSILLECTOMY    . TOTAL SHOULDER ARTHROPLASTY  02/03/2012   Procedure: TOTAL SHOULDER  ARTHROPLASTY;  Surgeon: [name], MD;  Location: [facility];  Service: Orthopedics;  Laterality: Left;    Allergies Patient has no known allergies.  Family History  Problem Relation Age of Onset  . Lung cancer Mother   . Hypertension Father   . Heart attack Father   . Diabetes Paternal Grandmother   . Brain cancer Brother   . Heart attack Brother   . Neuropathy Neg Hx     Social History Social History   Tobacco Use  . Smoking status: Former Smoker    Packs/day: 2.00    Years: 30.00    Pack years: 60.00    Types: Cigarettes    Quit date: 12/05/1990    Years since quitting: 29.2  . Smokeless tobacco: Never Used  Substance Use Topics  . Alcohol use: Yes    Alcohol/week: 0.0 standard drinks    Comment: past hx.ETOH abuse,08-31-15 "occ. wine ,drinks non alcoholic beer occ."  . Drug use: No    Review of Systems  Constitutional: No fever/chills Cardiovascular: Denies chest pain. Respiratory: Denies shortness of breath. Gastrointestinal: No abdominal pain.  Genitourinary: Positive burning near the foley cath site this AM.  Musculoskeletal: Negative for back pain. Skin: Negative for rash. Neurological: Negative for headaches, focal weakness or numbness.  10-point ROS  otherwise negative.  ____________________________________________   PHYSICAL EXAM:  VITAL SIGNS: ED Triage Vitals  Enc Vitals Group     BP 03/02/20 0808 (!) 143/64     Pulse Rate 03/02/20 0808 77     Resp 03/02/20 0808 18     Temp 03/02/20 0808 98.2 F (36.8 C)     Temp Source 03/02/20 0808 Oral     SpO2 03/02/20 0808 100 %     Weight 03/02/20 0803 125 lb 10.6 oz (57 kg)     Height 03/02/20 0803 5\' 11"  (1.803 m)   Constitutional: Alert and oriented. Well appearing and in no acute distress. Eyes: Conjunctivae are normal.  Head: Atraumatic. Nose: No congestion/rhinnorhea. Mouth/Throat: Mucous membranes are moist.  Neck: No stridor.  Cardiovascular: Normal rate, regular  rhythm. Good peripheral circulation. Grossly normal heart sounds.   Respiratory: Normal respiratory effort.  No retractions. Lungs CTAB. Gastrointestinal: Soft and nontender. No distention.  Genitourinary: Foley catheter appears well-positioned with no surrounding skin irritation, bleeding, leakage of urine.  Musculoskeletal: No gross deformities of extremities. Neurologic:  Normal speech and language.  Skin:  Skin is warm, dry and intact. No rash noted.   ____________________________________________   LABS (all labs ordered are listed, but only abnormal results are displayed)  Labs Reviewed  URINALYSIS, ROUTINE W REFLEX MICROSCOPIC - Abnormal; Notable for the following components:      Result Value   Hgb urine dipstick MODERATE (*)    Leukocytes,Ua LARGE (*)    WBC, UA >50 (*)    Bacteria, UA RARE (*)    All other components within normal limits  URINE CULTURE    ____________________________________________   PROCEDURES  Procedure(s) performed:   Procedures  None  ____________________________________________   INITIAL IMPRESSION / ASSESSMENT AND PLAN / ED COURSE  Pertinent labs & imaging results that were available during my care of the patient were reviewed by me and considered in my medical decision making (see chart for details).   Patient presents emergency department for evaluation of burning pain around his Foley catheter.  The catheter appears to be functioning well with normal output in the bag.  The appearance of the urine is grossly normal.  He is afebrile here.  Plan for UA along with culture.  He is seeing his urologist on April 1 and since the Foley appears to be functioning well and draining plan to leave this in place until his urologist can remove and potentially keep the Foley out.   09:25 AM  UA with large leukocytes, greater than 50 WBCs and rare bacteria.  We will send this for culture but plan to treat with Keflex given the patient's burning symptoms  at his Foley catheter site.  Foley catheter continues to function well here in the emergency department  We will start antibiotics at home and advised follow-up with his urologist as scheduled to decide when the Foley can be removed for outpatient void trial. Discussed ED return precautions and provided in writing as well.  ____________________________________________  FINAL CLINICAL IMPRESSION(S) / ED DIAGNOSES  Final diagnoses:  Dysuria     MEDICATIONS GIVEN DURING THIS VISIT:  Medications  cephALEXin (KEFLEX) capsule 500 mg (has no administration in time range)     NEW OUTPATIENT MEDICATIONS STARTED DURING THIS VISIT:  New Prescriptions   CEPHALEXIN (KEFLEX) 500 MG CAPSULE    Take 1 capsule (500 mg total) by mouth 3 (three) times daily for 7 days.    Note:  This document was prepared using Dragon  voice recognition software and may include unintentional dictation errors.  Nanda Quinton, MD, [facility] Emergency Medicine    Madell Heino, Wonda Olds, MD 03/02/20 [id number]

## 2020-03-02 NOTE — Discharge Instructions (Signed)
You were seen in the emergency department today with burning at your Foley catheter site.  The catheter appears to be functioning normally.  You do have evidence of a small amount of bacteria in the urine.  I am treating you with antibiotic for the next 7 days.  Please keep your appointment with your urologist as scheduled to decide when it is okay to remove the Foley catheter.  Return to the emergency department with worsening pain, fever, confusion, or other severe symptoms.

## 2020-03-02 NOTE — ED Notes (Signed)
Ems here to transport pt to [facility]

## 2020-03-02 NOTE — ED Notes (Signed)
Ems called to transport back to [facility] in [place].

## 2020-03-02 NOTE — ED Triage Notes (Signed)
Pt resident of [facility].  Pt c/o burning around foley catheter site around 0530 this morning.  Reports burning is intermittent and they gave him a pain pill.  Pt says has had foley for the past 2 weeks.

## 2020-03-03 ENCOUNTER — Telehealth: Payer: Self-pay | Admitting: *Deleted

## 2020-03-03 ENCOUNTER — Other Ambulatory Visit: Payer: Self-pay

## 2020-03-03 ENCOUNTER — Inpatient Hospital Stay: Payer: Medicare Other

## 2020-03-03 ENCOUNTER — Encounter: Payer: Self-pay | Admitting: Physician Assistant

## 2020-03-03 ENCOUNTER — Ambulatory Visit: Payer: Medicare Other

## 2020-03-03 ENCOUNTER — Inpatient Hospital Stay: Payer: Medicare Other | Attending: Internal Medicine | Admitting: Physician Assistant

## 2020-03-03 VITALS — BP 126/57 | HR 83 | Temp 98.5°F | Resp 18 | Ht 71.0 in | Wt 143.4 lb

## 2020-03-03 DIAGNOSIS — E871 Hypo-osmolality and hyponatremia: Secondary | ICD-10-CM | POA: Diagnosis not present

## 2020-03-03 DIAGNOSIS — C3431 Malignant neoplasm of lower lobe, right bronchus or lung: Secondary | ICD-10-CM | POA: Insufficient documentation

## 2020-03-03 DIAGNOSIS — R531 Weakness: Secondary | ICD-10-CM | POA: Insufficient documentation

## 2020-03-03 DIAGNOSIS — J189 Pneumonia, unspecified organism: Secondary | ICD-10-CM | POA: Diagnosis not present

## 2020-03-03 DIAGNOSIS — R05 Cough: Secondary | ICD-10-CM | POA: Insufficient documentation

## 2020-03-03 DIAGNOSIS — R5382 Chronic fatigue, unspecified: Secondary | ICD-10-CM

## 2020-03-03 DIAGNOSIS — F329 Major depressive disorder, single episode, unspecified: Secondary | ICD-10-CM | POA: Diagnosis not present

## 2020-03-03 DIAGNOSIS — G893 Neoplasm related pain (acute) (chronic): Secondary | ICD-10-CM

## 2020-03-03 DIAGNOSIS — R339 Retention of urine, unspecified: Secondary | ICD-10-CM | POA: Diagnosis not present

## 2020-03-03 DIAGNOSIS — C3490 Malignant neoplasm of unspecified part of unspecified bronchus or lung: Secondary | ICD-10-CM | POA: Diagnosis not present

## 2020-03-03 DIAGNOSIS — R5383 Other fatigue: Secondary | ICD-10-CM | POA: Insufficient documentation

## 2020-03-03 DIAGNOSIS — U071 COVID-19: Secondary | ICD-10-CM | POA: Diagnosis not present

## 2020-03-03 DIAGNOSIS — R0609 Other forms of dyspnea: Secondary | ICD-10-CM | POA: Insufficient documentation

## 2020-03-03 DIAGNOSIS — I749 Embolism and thrombosis of unspecified artery: Secondary | ICD-10-CM

## 2020-03-03 DIAGNOSIS — G4700 Insomnia, unspecified: Secondary | ICD-10-CM | POA: Insufficient documentation

## 2020-03-03 DIAGNOSIS — I480 Paroxysmal atrial fibrillation: Secondary | ICD-10-CM | POA: Diagnosis not present

## 2020-03-03 LAB — CBC WITH DIFFERENTIAL (CANCER CENTER ONLY)
Abs Immature Granulocytes: 0.09 10*3/uL — ABNORMAL HIGH (ref 0.00–0.07)
Basophils Absolute: 0.1 10*3/uL (ref 0.0–0.1)
Basophils Relative: 1 %
Eosinophils Absolute: 0.4 10*3/uL (ref 0.0–0.5)
Eosinophils Relative: 3 %
HCT: 34.7 % — ABNORMAL LOW (ref 39.0–52.0)
Hemoglobin: 11.5 g/dL — ABNORMAL LOW (ref 13.0–17.0)
Immature Granulocytes: 1 %
Lymphocytes Relative: 10 %
Lymphs Abs: 1 10*3/uL (ref 0.7–4.0)
MCH: 29.4 pg (ref 26.0–34.0)
MCHC: 33.1 g/dL (ref 30.0–36.0)
MCV: 88.7 fL (ref 80.0–100.0)
Monocytes Absolute: 0.9 10*3/uL (ref 0.1–1.0)
Monocytes Relative: 9 %
Neutro Abs: 8.1 10*3/uL — ABNORMAL HIGH (ref 1.7–7.7)
Neutrophils Relative %: 76 %
Platelet Count: 426 10*3/uL — ABNORMAL HIGH (ref 150–400)
RBC: 3.91 MIL/uL — ABNORMAL LOW (ref 4.22–5.81)
RDW: 15.5 % (ref 11.5–15.5)
WBC Count: 10.4 10*3/uL (ref 4.0–10.5)
nRBC: 0 % (ref 0.0–0.2)

## 2020-03-03 LAB — CMP (CANCER CENTER ONLY)
ALT: 13 U/L (ref 0–44)
AST: 17 U/L (ref 15–41)
Albumin: 2.6 g/dL — ABNORMAL LOW (ref 3.5–5.0)
Alkaline Phosphatase: 123 U/L (ref 38–126)
Anion gap: 6 (ref 5–15)
BUN: 12 mg/dL (ref 8–23)
CO2: 26 mmol/L (ref 22–32)
Calcium: 8.5 mg/dL — ABNORMAL LOW (ref 8.9–10.3)
Chloride: 98 mmol/L (ref 98–111)
Creatinine: 0.62 mg/dL (ref 0.61–1.24)
GFR, Est AFR Am: >60 mL/min (ref >60)
GFR, Estimated: >60 mL/min (ref >60)
Glucose, Bld: 84 mg/dL (ref 70–99)
Potassium: 4.2 mmol/L (ref 3.5–5.1)
Sodium: 130 mmol/L — ABNORMAL LOW (ref 135–145)
Total Bilirubin: 0.4 mg/dL (ref 0.3–1.2)
Total Protein: 5.4 g/dL — ABNORMAL LOW (ref 6.5–8.1)

## 2020-03-03 LAB — URINE CULTURE: Culture: 40000 — AB

## 2020-03-03 LAB — TSH: TSH: 2.665 u[IU]/mL (ref 0.320–4.118)

## 2020-03-03 MED ORDER — MIRTAZAPINE 30 MG PO TABS: 30.0000 mg | ORAL_TABLET | Freq: Every day | ORAL | 2 refills | Status: DC

## 2020-03-03 MED ORDER — DEMECLOCYCLINE HCL 150 MG PO TABS: 300.0000 mg | ORAL_TABLET | Freq: Two times a day (BID) | ORAL | 2 refills | Status: DC

## 2020-03-03 MED ORDER — HYDROCODONE-ACETAMINOPHEN 5-325 MG PO TABS: 1.0000 | ORAL_TABLET | Freq: Four times a day (QID) | ORAL | 0 refills | Status: DC | PRN

## 2020-03-03 NOTE — Telephone Encounter (Signed)
Patient called needing clarification on what medications he should take after todays visit.  He states that the list that he was sent home with only had 4 medications highlighted.  Upon further clarification found that those were the only medications that we manage and are refilling (Norco, Remeron, Synthroid, Declomycin).  All other clarification of medications needs to come from PCP Dr. [name].  Patient is at [facility] in [place] and they are giving him more medications than he normally takes which may include the pRN meds and he states is missing his Xanax.    Called Dr. Noland Fordyce office and left message for his nurse to call [facility] to complete medication reconciliation for the meds they manage.    Spoke with Dominica at [facility] and explained which medications we manage and asked that their in house NP also follow up on medication reconciliation.

## 2020-03-04 ENCOUNTER — Telehealth: Payer: Self-pay | Admitting: *Deleted

## 2020-03-04 ENCOUNTER — Telehealth: Payer: Self-pay | Admitting: Physician Assistant

## 2020-03-04 ENCOUNTER — Other Ambulatory Visit: Payer: Self-pay | Admitting: Medical Oncology

## 2020-03-04 ENCOUNTER — Telehealth: Payer: Self-pay

## 2020-03-04 DIAGNOSIS — G4700 Insomnia, unspecified: Secondary | ICD-10-CM

## 2020-03-04 DIAGNOSIS — E871 Hypo-osmolality and hyponatremia: Secondary | ICD-10-CM

## 2020-03-04 DIAGNOSIS — C3490 Malignant neoplasm of unspecified part of unspecified bronchus or lung: Secondary | ICD-10-CM

## 2020-03-04 MED ORDER — DEMECLOCYCLINE HCL 150 MG PO TABS: 300.0000 mg | ORAL_TABLET | Freq: Two times a day (BID) | ORAL | 2 refills | Status: DC

## 2020-03-04 MED ORDER — MIRTAZAPINE 30 MG PO TABS: 30.0000 mg | ORAL_TABLET | Freq: Every day | ORAL | 2 refills | Status: DC

## 2020-03-04 NOTE — Telephone Encounter (Signed)
Received call from [facility] advising that they would reach out to [facility] to do a medical reconciliation for the patient.

## 2020-03-04 NOTE — Telephone Encounter (Signed)
Scheduled per los. Called and spoke with patient and daughter. Confirmed appt

## 2020-03-04 NOTE — Telephone Encounter (Addendum)
No bacteria on UC done ED 03/02/20  Per Elray Buba PA , may stop Keflex.  Faxed to [facility] [phone]

## 2020-03-05 DIAGNOSIS — R338 Other retention of urine: Secondary | ICD-10-CM | POA: Diagnosis not present

## 2020-03-09 DIAGNOSIS — J1282 Pneumonia due to coronavirus disease 2019: Secondary | ICD-10-CM | POA: Diagnosis not present

## 2020-03-09 DIAGNOSIS — Z466 Encounter for fitting and adjustment of urinary device: Secondary | ICD-10-CM | POA: Diagnosis not present

## 2020-03-09 DIAGNOSIS — Z48815 Encounter for surgical aftercare following surgery on the digestive system: Secondary | ICD-10-CM | POA: Diagnosis not present

## 2020-03-09 DIAGNOSIS — Z4801 Encounter for change or removal of surgical wound dressing: Secondary | ICD-10-CM | POA: Diagnosis not present

## 2020-03-09 DIAGNOSIS — U071 COVID-19: Secondary | ICD-10-CM | POA: Diagnosis not present

## 2020-03-09 DIAGNOSIS — J9601 Acute respiratory failure with hypoxia: Secondary | ICD-10-CM | POA: Diagnosis not present

## 2020-03-10 ENCOUNTER — Encounter: Payer: Self-pay | Admitting: Cardiovascular Disease

## 2020-03-10 DIAGNOSIS — Z4801 Encounter for change or removal of surgical wound dressing: Secondary | ICD-10-CM | POA: Diagnosis not present

## 2020-03-10 DIAGNOSIS — U071 COVID-19: Secondary | ICD-10-CM | POA: Diagnosis not present

## 2020-03-10 DIAGNOSIS — J9601 Acute respiratory failure with hypoxia: Secondary | ICD-10-CM | POA: Diagnosis not present

## 2020-03-10 DIAGNOSIS — Z48815 Encounter for surgical aftercare following surgery on the digestive system: Secondary | ICD-10-CM | POA: Diagnosis not present

## 2020-03-10 DIAGNOSIS — Z466 Encounter for fitting and adjustment of urinary device: Secondary | ICD-10-CM | POA: Diagnosis not present

## 2020-03-10 DIAGNOSIS — J1282 Pneumonia due to coronavirus disease 2019: Secondary | ICD-10-CM | POA: Diagnosis not present

## 2020-03-11 DIAGNOSIS — S52501D Unspecified fracture of the lower end of right radius, subsequent encounter for closed fracture with routine healing: Secondary | ICD-10-CM | POA: Diagnosis not present

## 2020-03-12 ENCOUNTER — Ambulatory Visit (INDEPENDENT_AMBULATORY_CARE_PROVIDER_SITE_OTHER): Payer: Medicare Other | Admitting: Cardiovascular Disease

## 2020-03-12 ENCOUNTER — Encounter: Payer: Self-pay | Admitting: Cardiovascular Disease

## 2020-03-12 ENCOUNTER — Other Ambulatory Visit: Payer: Self-pay

## 2020-03-12 VITALS — BP 122/70 | HR 97 | Ht 71.0 in | Wt 143.0 lb

## 2020-03-12 DIAGNOSIS — I480 Paroxysmal atrial fibrillation: Secondary | ICD-10-CM | POA: Diagnosis not present

## 2020-03-12 DIAGNOSIS — Z7901 Long term (current) use of anticoagulants: Secondary | ICD-10-CM | POA: Diagnosis not present

## 2020-03-12 DIAGNOSIS — I3139 Other pericardial effusion (noninflammatory): Secondary | ICD-10-CM

## 2020-03-12 DIAGNOSIS — I313 Pericardial effusion (noninflammatory): Secondary | ICD-10-CM

## 2020-03-12 NOTE — Patient Instructions (Signed)
Medication Instructions:  The current medical regimen is effective;  continue present plan and medications.  *If you need a refill on your cardiac medications before your next appointment, please call your pharmacy*   Follow-Up: At [facility], you and your health needs are our priority.  As part of our continuing mission to provide you with exceptional heart care, we have created designated Provider Care Teams.  These Care Teams include your primary Cardiologist (physician) and Advanced Practice Providers (APPs -  Physician Assistants and Nurse Practitioners) who all work together to provide you with the care you need, when you need it.  We recommend signing up for the patient portal called "MyChart".  Sign up information is provided on this After Visit Summary.  MyChart is used to connect with patients for Virtual Visits (Telemedicine).  Patients are able to view lab/test results, encounter notes, upcoming appointments, etc.  Non-urgent messages can be sent to your provider as well.   To learn more about what you can do with MyChart, go to [email or web address].    Your next appointment:   As needed  The format for your next appointment:   Either In Person or Virtual  Provider:   [place] O'Neal, MD      

## 2020-03-12 NOTE — Progress Notes (Signed)
Cardiology Office Note:   Date:  03/12/2020  NAME:  Anthony Curry.    MRN: [id number] DOB:  [birth year]   PCP:  [name], MD  Cardiologist:  Evalina Field, MD   Referring MD: [name], MD   Chief Complaint  Patient presents with  . Pericardial Effusion   History of Present Illness:   Anthony Curry. is a 79 y.o. male with a hx of pericardial effusion, Afib, upper extremity thromboembolic arterial event, stage IV lung cancer who presents for follow-up of pericardial effusion.  I first met him in January when he was diagnosed with coronavirus.  He had a moderate pericardial effusion without evidence of tamponade.  He has had 2 other hospitalizations since I first met him.  He was hospitalized in February for an upper extremity arterial thromboembolic event.  He was recommended to remain on Eliquis indefinitely.  He was also recently admitted in March for hip fracture for which he underwent repair.  Apparently his chemotherapy for his stage IV lung cancer has been on hold because of this.  His EKG today demonstrates normal sinus rhythm with low voltage and no evidence of atrial fibrillation.  He reports he is profoundly weak and short of breath when he does any activity.  He apparently is doing rehabilitation at home.  He denies any chest pain or palpitations.  From what I can tell his atrial fibrillation has not recurred.  I do suspect he has severe deconditioning from his multiple hospitalizations and metastatic lung cancer.  His blood pressure is well stable today on examination he has no evidence of JVD or lower extremity edema.  Overall he has no findings consistent with tamponade.  No bleeding reported with Eliquis.  He denies chest pain, shortness of breath, palpitations today.  He reports getting to our visit today tired him significantly.  He apparently has been frustrated with his care as he has been told he has atrial fibrillation but no further recurrences been  detected.  Problem List 1. Afib -TEE/DCCV 2015 2. Chronic pericardial effusion -lung CA related  3. L subclavian axillary brachial radial ulnar embolectomy 01/06/2020 -eliquis indefinitely 4. Stage IV Lung CA on immunotherapy   Past Medical History: Past Medical History:  Diagnosis Date  . 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 Adrenal mass ([place])   . Alcoholism in remission ([place])   . Anemia of chronic disease   . 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 Anxiety   . BPH (benign prostatic hyperplasia)   . Cancer ([place])    small cell/lung  . Complication of anesthesia 09/08/2015    JITTERY AFTER GALLBLADDER SURGERY  . Femur fracture ([place])   . GERD (gastroesophageal reflux disease)   . History of radiation therapy 11/14/16, 11/18/16, 11/22/16   Left upper lung: 54 Gy in 3 fractions  . Hypertension   . Hyponatremia   . Neoplastic malignant related fatigue    IV tx. every 2 weeks last9-14-16 (Dr. Earlie Server), radiation last tx. 6 months  . 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 Pericardial effusion   . Pericardial effusion   . Persistent atrial fibrillation ([place])   . Protein calorie malnutrition ([place])   . Pulmonary nodules   . Radiation 12/02/14-12/16/14   Left adrenal gland 30 Gy in 10 fractions  . Urinary retention     Past Surgical History: Past Surgical History:  Procedure Laterality Date  . ANKLE ARTHRODESIS  1995   rt fx-hardware in  . CARDIOVERSION N/A  09/29/2014   Procedure: CARDIOVERSION;  Surgeon: Josue Hector, MD;  Location: [facility] ENDOSCOPY;  Service: Cardiovascular;  Laterality: N/A;  . CHOLECYSTECTOMY  09/08/2015    laproscopic   . CHOLECYSTECTOMY N/A 09/08/2015   Procedure: LAPAROSCOPIC CHOLECYSTECTOMY WITH ATTEMPTED INTRAOPERATIVE CHOLANGIOGRAM ;  Surgeon: Fanny Skates, MD;  Location: [facility];  Service: General;  Laterality: N/A;  . COLONOSCOPY    . EMBOLECTOMY Left 01/06/2020   Procedure: Embolectomy left axillary, brachial, radial, and ulnar artery;   Surgeon: Elam Dutch, MD;  Location: [facility] OR;  Service: Vascular;  Laterality: Left;  . ERCP N/A 09/02/2015   Procedure: ENDOSCOPIC RETROGRADE CHOLANGIOPANCREATOGRAPHY (ERCP);  Surgeon: Arta Silence, MD;  Location: [facility];  Service: Endoscopy;  Laterality: N/A;  . ERCP N/A 09/07/2015   Procedure: ENDOSCOPIC RETROGRADE CHOLANGIOPANCREATOGRAPHY (ERCP);  Surgeon: Clarene Essex, MD;  Location: [facility]OS[facility];  Service: Endoscopy;  Laterality: N/A;  . EXCISION / CURETTAGE BONE CYST FINGER  2005   rt thumb  . HEMORRHOID SURGERY    . HIP ARTHROPLASTY Left 02/18/2020   Procedure: LEFT HIP HEMIARTHROPLASTY;  Surgeon: Marchia Bond, MD;  Location: [facility];  Service: Orthopedics;  Laterality: Left;  . LAPAROTOMY N/A 01/05/2020   Procedure: EXPLORATORY LAPAROTOMY sigmoid colon resection creation end colostomy for perforated diverticulitis and purulent peritonitis;  Surgeon: Kieth Brightly Arta Bruce, MD;  Location: [facility];  Service: General;  Laterality: N/A;  . Percutaneous Cholecytostomy tube     IVR insertion 06-13-15(Dr. Vernard Gambles)- 08-28-15 remains in place to drainage bag  . TONSILLECTOMY    . TOTAL SHOULDER ARTHROPLASTY  02/03/2012   Procedure: TOTAL SHOULDER ARTHROPLASTY;  Surgeon: [name], MD;  Location: [facility];  Service: Orthopedics;  Laterality: Left;    Current Medications: Current Meds  Medication Sig  . apixaban (ELIQUIS) 5 MG TABS tablet Take 5 mg by mouth 2 (two) times daily.  . benzonatate (TESSALON) 200 MG capsule Take 1 capsule (200 mg total) by mouth 3 (three) times daily as needed for cough.  . demeclocycline (DECLOMYCIN) 150 MG tablet Take 2 tablets (300 mg total) by mouth 2 (two) times daily.  [name] diltiazem (CARDIZEM CD) 180 MG 24 hr capsule Take 1 capsule (180 mg total) by mouth daily at 12 noon.  . ferrous sulfate 325 (65 FE) MG tablet Take 1 tablet (325 mg total) by mouth 3 (three) times daily after meals.  . finasteride (PROSCAR) 5 MG tablet Take 1 tablet (5  mg total) by mouth daily.  . fluticasone (FLONASE) 50 MCG/ACT nasal spray Place 2 sprays into both nostrils at bedtime.   [name] guaiFENesin (MUCINEX) 600 MG 12 hr tablet Take 1 tablet (600 mg total) by mouth 2 (two) times daily.  [name] HYDROcodone-acetaminophen (NORCO) 5-325 MG tablet Take 1 tablet by mouth every 6 (six) hours as needed for moderate pain.  [name] KLOR-CON M20 20 MEQ tablet Take 20 mEq by mouth daily at 12 noon.   . Lactobacillus (ACIDOPHILUS) CAPS capsule Take 1 capsule by mouth daily.  [name] levalbuterol (XOPENEX) 0.63 MG/3ML nebulizer solution Take 3 mLs (0.63 mg total) by nebulization every 6 (six) hours as needed for wheezing or shortness of breath.  . levothyroxine (SYNTHROID, LEVOTHROID) 50 MCG tablet TAKE 1 TABLET (50 MCG TOTAL) BY MOUTH DAILY BEFORE BREAKFAST.  [name] MAGNESIUM PO Take 400 mg by mouth daily.   . mirtazapine (REMERON) 30 MG tablet Take 1 tablet (30 mg total) by mouth at bedtime.  . Nivolumab (OPDIVO IV) Inject as directed every 30 (thirty) days.  Receives '480mg'$  every 28 days.  . Nutritional Supplements (ENSURE COMPLETE PO) Take 1 Can by mouth 2 (two) times daily.  . Omega-3 Fatty Acids (FISH OIL PO) Take 1,000 mg by mouth daily.   [name] OVER THE COUNTER MEDICATION Take 10 mLs by mouth daily. Over the counter liquid multivitamin  . pantoprazole (PROTONIX) 40 MG tablet Take 1 tablet (40 mg total) by mouth daily.  . tamsulosin (FLOMAX) 0.4 MG CAPS capsule Take 1 capsule (0.4 mg total) by mouth 2 (two) times daily.  [name] thiamine (VITAMIN B-1) 100 MG tablet Take 1 tablet (100 mg total) by mouth daily.     Allergies:    Patient has no known allergies.   Social History: Social History   Socioeconomic History  . Marital status: Married    Spouse name: Javaughn Opdahl  . Number of children: 3  . Years of education: 73  . Highest education level: Not on file  Occupational History  . 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 Smoking  status: Former Smoker    Packs/day: 2.00    Years: 30.00    Pack years: 60.00    Types: Cigarettes    Quit date: 12/05/1990    Years since quitting: 29.2  . Smokeless tobacco: Never Used  Substance and Sexual Activity  . Alcohol use: Yes    Alcohol/week: 0.0 standard drinks    Comment: past hx.ETOH abuse,08-31-15 "occ. wine ,drinks non alcoholic beer occ."  . Drug use: No  . Sexual activity: Not Currently  Other Topics Concern  . Not on file  Social History Narrative   Lives at home with wife.   Caffeine use: Drinks coffee- 2 cups per day   Retired from [employer] Strain:   . Difficulty of Paying Living Expenses:   Food Insecurity:   . Worried About Charity fundraiser in the Last Year:   . Arboriculturist in the Last Year:   Transportation Needs:   . Film/video editor (Medical):   [name] Lack of Transportation (Non-Medical):   Physical Activity:   . Days of Exercise per Week:   . Minutes of Exercise per Session:   Stress:   . Feeling of Stress :   Social Connections:   . Frequency of Communication with Friends and Family:   . Frequency of Social Gatherings with Friends and Family:   . Attends Religious Services:   . Active Member of Clubs or Organizations:   . Attends Archivist Meetings:   [name] Marital Status:      Family History: The patient's family history includes Brain cancer in his brother; Diabetes in his paternal grandmother; Heart attack in his brother and father; Hypertension in his father; Lung cancer in his mother. There is no history of Neuropathy.  ROS:   All other ROS reviewed and negative. Pertinent positives noted in the HPI.     EKGs/Labs/Other Studies Reviewed:   The following studies were personally reviewed by me today:  EKG:  EKG is ordered today.  The ekg ordered today demonstrates normal sinus rhythm with heart rate 97, first AV block, low voltage noted, and was personally reviewed by  me.   TTE 01/02/2020 1. Left ventricular ejection fraction, by visual estimation, is 60 to  65%. The left ventricle has normal function. There is no left ventricular  hypertrophy.  2. Left ventricular diastolic  parameters are consistent with Grade I  diastolic dysfunction (impaired relaxation).  3. Global right ventricle has normal systolic function.The right  ventricular size is normal. No increase in right ventricular wall  thickness.  4. Left atrial size was normal.  5. Right atrial size was normal.  6. Large pericardial effusion.  7. The pericardial effusion is anterior to the right ventricle.  8. There is inversion of the right ventricular wall and excessive  respiratory variation in the tricuspid valve spectral Doppler velocities.  9. The mitral valve is normal in structure. Mild mitral valve  regurgitation. No evidence of mitral stenosis.  10. The tricuspid valve is normal in structure.  11. The tricuspid valve is normal in structure. Tricuspid valve  regurgitation is mild.  12. The aortic valve is normal in structure. Aortic valve regurgitation is  not visualized. No evidence of aortic valve sclerosis or stenosis.  13. The pulmonic valve was normal in structure. Pulmonic valve  regurgitation is not visualized.  14. Mildly elevated pulmonary artery systolic pressure.  15. The inferior vena cava is normal in size with greater than 50%  respiratory variability, suggesting right atrial pressure of 3 mmHg.  Recent Labs: 01/05/2020: B Natriuretic Peptide 109.6 02/24/2020: Magnesium 1.6 03/03/2020: ALT 13; BUN 12; Creatinine 0.62; Hemoglobin 11.5; Platelet Count 426; Potassium 4.2; Sodium 130; TSH 2.665   Recent Lipid Panel    Component Value Date/Time   CHOL  05/30/2007 0400    158        ATP III CLASSIFICATION:  <200     mg/dL   Desirable  200-239  mg/dL   Borderline High  >=240    mg/dL   High   TRIG 40 05/30/2007 0400   HDL 55 05/30/2007 0400   CHOLHDL 2.9  05/30/2007 0400   VLDL 8 05/30/2007 0400   LDLCALC  05/30/2007 0400    95        Total Cholesterol/HDL:CHD Risk Coronary Heart Disease Risk Table                     Men   Women  1/2 Average Risk   3.4   3.3    Physical Exam:   VS:  BP 122/70   Pulse 97   Ht '5\' 11"'$  (1.803 m)   Wt 143 lb (64.9 kg)   SpO2 94%   BMI 19.94 kg/m    Wt Readings from Last 3 Encounters:  03/12/20 143 lb (64.9 kg)  03/03/20 143 lb 6.4 oz (65 kg)  03/02/20 125 lb 10.6 oz (57 kg)    General: Frail, cachexia noted Heart: Atraumatic, normal size  Eyes: PEERLA, EOMI  Neck: Supple, no JVD Endocrine: No thryomegaly Cardiac: Normal S1, S2; RRR; no murmurs, rubs, or gallops Lungs: Clear to auscultation bilaterally, no wheezing, rhonchi or rales  Abd: Soft, nontender, no hepatomegaly  Ext: No edema, pulses 2+ Musculoskeletal: No deformities, BUE and BLE strength normal and equal Skin: Warm and dry, no rashes   Neuro: Alert and oriented to person, place, time, and situation, CNII-XII grossly intact, no focal deficits  Psych: Normal mood and affect   ASSESSMENT:   Lacorey Curry. is a 79 y.o. male who presents for the following: 1. Pericardial effusion   2. Paroxysmal atrial fibrillation (HCC)   3. Adequate anticoagulation on anticoagulant therapy     PLAN:   1. Pericardial effusion -Moderate.  Stable.  No signs of tamponade today.  This may never become an issue for  him.  He has no evidence of lower extremity edema or JVD to suggest this is a problem for him.  I think for now we can just follow him as needed for his pericardial effusion.  -This is all secondary to his metastatic lung cancer.  He does appear frail and cachectic.  Apparently has not restarted treatment.  He will continue to follow with his oncologist.  2. Paroxysmal atrial fibrillation ([place]) -He had a arterial thromboembolic event recently.  He will remain on Eliquis indefinitely.  No further recurrence of atrial fibrillation that I  can tell.  3. Adequate anticoagulation on anticoagulant therapy -He will remain on Eliquis for his arterial thromboembolic event as well as atrial fibrillation.  Disposition: Return if symptoms worsen or fail to improve.  Medication Adjustments/Labs and Tests Ordered: Current medicines are reviewed at length with the patient today.  Concerns regarding medicines are outlined above.  Orders Placed This Encounter  Procedures  . EKG 12-Lead   No orders of the defined types were placed in this encounter.   Patient Instructions  Medication Instructions:  The current medical regimen is effective;  continue present plan and medications.  *If you need a refill on your cardiac medications before your next appointment, please call your pharmacy*   Follow-Up: At [facility], you and your health needs are our priority.  As part of our continuing mission to provide you with exceptional heart care, we have created designated Provider Care Teams.  These Care Teams include your primary Cardiologist (physician) and Advanced Practice Providers (APPs -  Physician Assistants and Nurse Practitioners) who all work together to provide you with the care you need, when you need it.  We recommend signing up for the patient portal called "MyChart".  Sign up information is provided on this After Visit Summary.  MyChart is used to connect with patients for Virtual Visits (Telemedicine).  Patients are able to view lab/test results, encounter notes, upcoming appointments, etc.  Non-urgent messages can be sent to your provider as well.   To learn more about what you can do with MyChart, go to [email or web address].    Your next appointment:   As needed  The format for your next appointment:   Either In Person or Virtual  Provider:   Eleonore Chiquito, MD        Time Spent with Patient: I have spent a total of 25 minutes with patient reviewing hospital notes, telemetry, EKGs, labs and examining the patient  as well as establishing an assessment and plan that was discussed with the patient.  > 50% of time was spent in direct patient care.  Signed, Addison Naegeli. Audie Box, [place]  [address] [place], [place] [address] [phone]  03/12/2020 5:20 PM

## 2020-03-13 ENCOUNTER — Emergency Department (HOSPITAL_COMMUNITY): Payer: Medicare Other

## 2020-03-13 ENCOUNTER — Inpatient Hospital Stay (HOSPITAL_COMMUNITY)
Admission: EM | Admit: 2020-03-13 | Discharge: 2020-03-25 | DRG: 640 | Disposition: A | Discharge status: Rehabilitation Facility | Payer: Medicare Other | Attending: Internal Medicine | Admitting: Internal Medicine

## 2020-03-13 ENCOUNTER — Encounter: Payer: Self-pay | Admitting: Internal Medicine

## 2020-03-13 ENCOUNTER — Encounter (HOSPITAL_COMMUNITY): Payer: Self-pay | Admitting: Internal Medicine

## 2020-03-13 ENCOUNTER — Observation Stay (HOSPITAL_COMMUNITY): Payer: Medicare Other

## 2020-03-13 ENCOUNTER — Other Ambulatory Visit: Payer: Self-pay

## 2020-03-13 DIAGNOSIS — Z923 Personal history of irradiation: Secondary | ICD-10-CM

## 2020-03-13 DIAGNOSIS — Z79890 Hormone replacement therapy (postmenopausal): Secondary | ICD-10-CM

## 2020-03-13 DIAGNOSIS — S0993XA Unspecified injury of face, initial encounter: Secondary | ICD-10-CM | POA: Diagnosis not present

## 2020-03-13 DIAGNOSIS — J690 Pneumonitis due to inhalation of food and vomit: Secondary | ICD-10-CM | POA: Diagnosis present

## 2020-03-13 DIAGNOSIS — I4891 Unspecified atrial fibrillation: Secondary | ICD-10-CM | POA: Diagnosis not present

## 2020-03-13 DIAGNOSIS — M79645 Pain in left finger(s): Secondary | ICD-10-CM | POA: Diagnosis not present

## 2020-03-13 DIAGNOSIS — Z96642 Presence of left artificial hip joint: Secondary | ICD-10-CM | POA: Diagnosis present

## 2020-03-13 DIAGNOSIS — M25559 Pain in unspecified hip: Secondary | ICD-10-CM

## 2020-03-13 DIAGNOSIS — T83511A Infection and inflammatory reaction due to indwelling urethral catheter, initial encounter: Secondary | ICD-10-CM | POA: Diagnosis not present

## 2020-03-13 DIAGNOSIS — R05 Cough: Secondary | ICD-10-CM | POA: Diagnosis not present

## 2020-03-13 DIAGNOSIS — W050XXA Fall from non-moving wheelchair, initial encounter: Secondary | ICD-10-CM | POA: Diagnosis present

## 2020-03-13 DIAGNOSIS — B3749 Other urogenital candidiasis: Secondary | ICD-10-CM | POA: Diagnosis not present

## 2020-03-13 DIAGNOSIS — W19XXXA Unspecified fall, initial encounter: Secondary | ICD-10-CM | POA: Diagnosis not present

## 2020-03-13 DIAGNOSIS — Z66 Do not resuscitate: Secondary | ICD-10-CM | POA: Diagnosis present

## 2020-03-13 DIAGNOSIS — S2249XA Multiple fractures of ribs, unspecified side, initial encounter for closed fracture: Secondary | ICD-10-CM | POA: Diagnosis not present

## 2020-03-13 DIAGNOSIS — I482 Chronic atrial fibrillation, unspecified: Secondary | ICD-10-CM | POA: Diagnosis not present

## 2020-03-13 DIAGNOSIS — R64 Cachexia: Secondary | ICD-10-CM | POA: Diagnosis present

## 2020-03-13 DIAGNOSIS — E039 Hypothyroidism, unspecified: Secondary | ICD-10-CM | POA: Diagnosis not present

## 2020-03-13 DIAGNOSIS — W101XXA Fall (on)(from) sidewalk curb, initial encounter: Secondary | ICD-10-CM | POA: Diagnosis not present

## 2020-03-13 DIAGNOSIS — Z9049 Acquired absence of other specified parts of digestive tract: Secondary | ICD-10-CM

## 2020-03-13 DIAGNOSIS — K5792 Diverticulitis of intestine, part unspecified, without perforation or abscess without bleeding: Secondary | ICD-10-CM | POA: Diagnosis present

## 2020-03-13 DIAGNOSIS — M109 Gout, unspecified: Secondary | ICD-10-CM | POA: Diagnosis present

## 2020-03-13 DIAGNOSIS — J90 Pleural effusion, not elsewhere classified: Secondary | ICD-10-CM | POA: Diagnosis present

## 2020-03-13 DIAGNOSIS — C3490 Malignant neoplasm of unspecified part of unspecified bronchus or lung: Secondary | ICD-10-CM | POA: Diagnosis present

## 2020-03-13 DIAGNOSIS — J189 Pneumonia, unspecified organism: Secondary | ICD-10-CM | POA: Diagnosis present

## 2020-03-13 DIAGNOSIS — Z7901 Long term (current) use of anticoagulants: Secondary | ICD-10-CM

## 2020-03-13 DIAGNOSIS — Z808 Family history of malignant neoplasm of other organs or systems: Secondary | ICD-10-CM

## 2020-03-13 DIAGNOSIS — Z9981 Dependence on supplemental oxygen: Secondary | ICD-10-CM

## 2020-03-13 DIAGNOSIS — M25469 Effusion, unspecified knee: Secondary | ICD-10-CM

## 2020-03-13 DIAGNOSIS — Z801 Family history of malignant neoplasm of trachea, bronchus and lung: Secondary | ICD-10-CM

## 2020-03-13 DIAGNOSIS — Z79899 Other long term (current) drug therapy: Secondary | ICD-10-CM

## 2020-03-13 DIAGNOSIS — Z79891 Long term (current) use of opiate analgesic: Secondary | ICD-10-CM

## 2020-03-13 DIAGNOSIS — F419 Anxiety disorder, unspecified: Secondary | ICD-10-CM | POA: Diagnosis present

## 2020-03-13 DIAGNOSIS — Z8249 Family history of ischemic heart disease and other diseases of the circulatory system: Secondary | ICD-10-CM

## 2020-03-13 DIAGNOSIS — E871 Hypo-osmolality and hyponatremia: Principal | ICD-10-CM | POA: Diagnosis present

## 2020-03-13 DIAGNOSIS — I313 Pericardial effusion (noninflammatory): Secondary | ICD-10-CM | POA: Diagnosis not present

## 2020-03-13 DIAGNOSIS — M25552 Pain in left hip: Secondary | ICD-10-CM | POA: Diagnosis not present

## 2020-03-13 DIAGNOSIS — S0081XA Abrasion of other part of head, initial encounter: Secondary | ICD-10-CM | POA: Diagnosis present

## 2020-03-13 DIAGNOSIS — M1712 Unilateral primary osteoarthritis, left knee: Secondary | ICD-10-CM | POA: Diagnosis present

## 2020-03-13 DIAGNOSIS — S6992XA Unspecified injury of left wrist, hand and finger(s), initial encounter: Secondary | ICD-10-CM | POA: Diagnosis not present

## 2020-03-13 DIAGNOSIS — I3139 Other pericardial effusion (noninflammatory): Secondary | ICD-10-CM | POA: Diagnosis present

## 2020-03-13 DIAGNOSIS — M25569 Pain in unspecified knee: Secondary | ICD-10-CM

## 2020-03-13 DIAGNOSIS — Z933 Colostomy status: Secondary | ICD-10-CM

## 2020-03-13 DIAGNOSIS — S0990XA Unspecified injury of head, initial encounter: Secondary | ICD-10-CM | POA: Diagnosis not present

## 2020-03-13 DIAGNOSIS — R519 Headache, unspecified: Secondary | ICD-10-CM | POA: Diagnosis not present

## 2020-03-13 DIAGNOSIS — B948 Sequelae of other specified infectious and parasitic diseases: Secondary | ICD-10-CM | POA: Diagnosis not present

## 2020-03-13 DIAGNOSIS — I10 Essential (primary) hypertension: Secondary | ICD-10-CM | POA: Diagnosis not present

## 2020-03-13 DIAGNOSIS — S2241XA Multiple fractures of ribs, right side, initial encounter for closed fracture: Secondary | ICD-10-CM | POA: Diagnosis present

## 2020-03-13 DIAGNOSIS — J9601 Acute respiratory failure with hypoxia: Secondary | ICD-10-CM | POA: Diagnosis present

## 2020-03-13 DIAGNOSIS — L89151 Pressure ulcer of sacral region, stage 1: Secondary | ICD-10-CM | POA: Diagnosis present

## 2020-03-13 DIAGNOSIS — Z87891 Personal history of nicotine dependence: Secondary | ICD-10-CM

## 2020-03-13 DIAGNOSIS — R059 Cough, unspecified: Secondary | ICD-10-CM

## 2020-03-13 DIAGNOSIS — R296 Repeated falls: Secondary | ICD-10-CM | POA: Diagnosis present

## 2020-03-13 DIAGNOSIS — S199XXA Unspecified injury of neck, initial encounter: Secondary | ICD-10-CM | POA: Diagnosis not present

## 2020-03-13 DIAGNOSIS — L8990 Pressure ulcer of unspecified site, unspecified stage: Secondary | ICD-10-CM | POA: Insufficient documentation

## 2020-03-13 DIAGNOSIS — I480 Paroxysmal atrial fibrillation: Secondary | ICD-10-CM | POA: Diagnosis present

## 2020-03-13 DIAGNOSIS — Z20822 Contact with and (suspected) exposure to covid-19: Secondary | ICD-10-CM | POA: Diagnosis not present

## 2020-03-13 DIAGNOSIS — R52 Pain, unspecified: Secondary | ICD-10-CM | POA: Diagnosis not present

## 2020-03-13 DIAGNOSIS — Y846 Urinary catheterization as the cause of abnormal reaction of the patient, or of later complication, without mention of misadventure at the time of the procedure: Secondary | ICD-10-CM | POA: Diagnosis not present

## 2020-03-13 DIAGNOSIS — S79912A Unspecified injury of left hip, initial encounter: Secondary | ICD-10-CM | POA: Diagnosis not present

## 2020-03-13 DIAGNOSIS — Z681 Body mass index (BMI) 19 or less, adult: Secondary | ICD-10-CM

## 2020-03-13 DIAGNOSIS — M25562 Pain in left knee: Secondary | ICD-10-CM

## 2020-03-13 DIAGNOSIS — Z85858 Personal history of malignant neoplasm of other endocrine glands: Secondary | ICD-10-CM

## 2020-03-13 DIAGNOSIS — Z886 Allergy status to analgesic agent status: Secondary | ICD-10-CM

## 2020-03-13 DIAGNOSIS — F329 Major depressive disorder, single episode, unspecified: Secondary | ICD-10-CM | POA: Diagnosis present

## 2020-03-13 DIAGNOSIS — R0600 Dyspnea, unspecified: Secondary | ICD-10-CM

## 2020-03-13 DIAGNOSIS — S72002D Fracture of unspecified part of neck of left femur, subsequent encounter for closed fracture with routine healing: Secondary | ICD-10-CM | POA: Diagnosis not present

## 2020-03-13 HISTORY — DX: Diverticulitis of intestine, part unspecified, with perforation and abscess without bleeding: K57.80

## 2020-03-13 HISTORY — DX: Chronic atrial fibrillation, unspecified: I48.20

## 2020-03-13 HISTORY — DX: Repeated falls: R29.6

## 2020-03-13 HISTORY — DX: Malignant neoplasm of unspecified part of unspecified bronchus or lung: C34.90

## 2020-03-13 HISTORY — DX: Other fracture of unspecified lower leg, initial encounter for closed fracture: S82.899A

## 2020-03-13 HISTORY — DX: Depression, unspecified: F32.A

## 2020-03-13 LAB — CBC WITH DIFFERENTIAL/PLATELET
Abs Immature Granulocytes: 0.1 10*3/uL — ABNORMAL HIGH (ref 0.00–0.07)
Basophils Absolute: 0.1 10*3/uL (ref 0.0–0.1)
Basophils Relative: 1 %
Eosinophils Absolute: 0.2 10*3/uL (ref 0.0–0.5)
Eosinophils Relative: 2 %
HCT: 35.6 % — ABNORMAL LOW (ref 39.0–52.0)
Hemoglobin: 11.8 g/dL — ABNORMAL LOW (ref 13.0–17.0)
Immature Granulocytes: 1 %
Lymphocytes Relative: 10 %
Lymphs Abs: 0.9 10*3/uL (ref 0.7–4.0)
MCH: 28.7 pg (ref 26.0–34.0)
MCHC: 33.1 g/dL (ref 30.0–36.0)
MCV: 86.6 fL (ref 80.0–100.0)
Monocytes Absolute: 0.8 10*3/uL (ref 0.1–1.0)
Monocytes Relative: 9 %
Neutro Abs: 7.2 10*3/uL (ref 1.7–7.7)
Neutrophils Relative %: 77 %
Platelets: 395 10*3/uL (ref 150–400)
RBC: 4.11 MIL/uL — ABNORMAL LOW (ref 4.22–5.81)
RDW: 14.7 % (ref 11.5–15.5)
WBC: 9.3 10*3/uL (ref 4.0–10.5)
nRBC: 0 % (ref 0.0–0.2)

## 2020-03-13 LAB — ECHOCARDIOGRAM LIMITED
Height: 71 in
Weight: 2160 oz

## 2020-03-13 LAB — URINALYSIS, ROUTINE W REFLEX MICROSCOPIC
Bilirubin Urine: NEGATIVE
Glucose, UA: NEGATIVE mg/dL
Ketones, ur: NEGATIVE mg/dL
Nitrite: NEGATIVE
Protein, ur: NEGATIVE mg/dL
Specific Gravity, Urine: 1.011 (ref 1.005–1.030)
WBC, UA: >50 WBC/hpf — ABNORMAL HIGH (ref 0–5)
pH: 7 (ref 5.0–8.0)

## 2020-03-13 LAB — COMPREHENSIVE METABOLIC PANEL
ALT: 19 U/L (ref 0–44)
AST: 30 U/L (ref 15–41)
Albumin: 2.9 g/dL — ABNORMAL LOW (ref 3.5–5.0)
Alkaline Phosphatase: 124 U/L (ref 38–126)
Anion gap: 6 (ref 5–15)
BUN: 12 mg/dL (ref 8–23)
CO2: 26 mmol/L (ref 22–32)
Calcium: 8.7 mg/dL — ABNORMAL LOW (ref 8.9–10.3)
Chloride: 92 mmol/L — ABNORMAL LOW (ref 98–111)
Creatinine, Ser: 0.6 mg/dL — ABNORMAL LOW (ref 0.61–1.24)
GFR calc Af Amer: >60 mL/min (ref >60)
GFR calc non Af Amer: >60 mL/min (ref >60)
Glucose, Bld: 103 mg/dL — ABNORMAL HIGH (ref 70–99)
Potassium: 4.6 mmol/L (ref 3.5–5.1)
Sodium: 124 mmol/L — ABNORMAL LOW (ref 135–145)
Total Bilirubin: 0.7 mg/dL (ref 0.3–1.2)
Total Protein: 5.4 g/dL — ABNORMAL LOW (ref 6.5–8.1)

## 2020-03-13 LAB — OSMOLALITY: Osmolality: 255 mOsm/kg — ABNORMAL LOW (ref 275–295)

## 2020-03-13 LAB — SARS CORONAVIRUS 2 (TAT 6-24 HRS): SARS Coronavirus 2: NEGATIVE

## 2020-03-13 MED ORDER — IOHEXOL 300 MG/ML  SOLN
100.0000 mL | Freq: Once | INTRAMUSCULAR | Status: AC | PRN
  Administered 2020-03-13: 100 mL via INTRAVENOUS

## 2020-03-13 MED ORDER — SODIUM CHLORIDE 0.9 % IV BOLUS
500.0000 mL | Freq: Once | INTRAVENOUS | Status: AC
  Administered 2020-03-13: 500 mL via INTRAVENOUS

## 2020-03-13 MED ORDER — ACETAMINOPHEN 650 MG RE SUPP: 650.0000 mg | Freq: Four times a day (QID) | RECTAL | Status: DC | PRN

## 2020-03-13 MED ORDER — HYDRALAZINE HCL 20 MG/ML IJ SOLN
5.0000 mg | INTRAMUSCULAR | Status: DC | PRN
  Administered 2020-03-25: 5 mg via INTRAVENOUS
  Filled 2020-03-13 (×2): qty 1

## 2020-03-13 MED ORDER — LIDOCAINE 5 % EX PTCH
1.0000 | MEDICATED_PATCH | CUTANEOUS | Status: DC
  Administered 2020-03-13 – 2020-03-24 (×12): 1 via TRANSDERMAL
  Filled 2020-03-13 (×14): qty 1

## 2020-03-13 MED ORDER — DILTIAZEM HCL ER COATED BEADS 180 MG PO CP24
180.0000 mg | ORAL_CAPSULE | Freq: Every day | ORAL | Status: DC
  Administered 2020-03-13 – 2020-03-25 (×13): 180 mg via ORAL
  Filled 2020-03-13 (×14): qty 1

## 2020-03-13 MED ORDER — MIRTAZAPINE 15 MG PO TABS
30.0000 mg | ORAL_TABLET | Freq: Every day | ORAL | Status: DC
  Administered 2020-03-13 – 2020-03-16 (×4): 30 mg via ORAL
  Filled 2020-03-13: qty 1
  Filled 2020-03-13 (×4): qty 2

## 2020-03-13 MED ORDER — HYDROCODONE-ACETAMINOPHEN 5-325 MG PO TABS
1.0000 | ORAL_TABLET | ORAL | Status: DC | PRN
  Administered 2020-03-13: 2 via ORAL
  Administered 2020-03-14 – 2020-03-15 (×2): 1 via ORAL
  Administered 2020-03-15: 2 via ORAL
  Administered 2020-03-16: 1 via ORAL
  Administered 2020-03-16: 2 via ORAL
  Administered 2020-03-16: 1 via ORAL
  Administered 2020-03-17 (×2): 2 via ORAL
  Filled 2020-03-13 (×5): qty 2
  Filled 2020-03-13 (×3): qty 1
  Filled 2020-03-13: qty 2
  Filled 2020-03-13: qty 1

## 2020-03-13 MED ORDER — APIXABAN 5 MG PO TABS
5.0000 mg | ORAL_TABLET | Freq: Two times a day (BID) | ORAL | Status: DC
  Administered 2020-03-13 – 2020-03-18 (×10): 5 mg via ORAL
  Filled 2020-03-13 (×11): qty 1

## 2020-03-13 MED ORDER — LEVOTHYROXINE SODIUM 50 MCG PO TABS
50.0000 ug | ORAL_TABLET | Freq: Every day | ORAL | Status: DC
  Administered 2020-03-14 – 2020-03-25 (×12): 50 ug via ORAL
  Filled 2020-03-13 (×12): qty 1

## 2020-03-13 MED ORDER — SODIUM CHLORIDE 0.9 % IV SOLN: INTRAVENOUS | Status: DC

## 2020-03-13 MED ORDER — DOCUSATE SODIUM 100 MG PO CAPS
100.0000 mg | ORAL_CAPSULE | Freq: Two times a day (BID) | ORAL | Status: DC
  Administered 2020-03-13 – 2020-03-25 (×22): 100 mg via ORAL
  Filled 2020-03-13 (×23): qty 1

## 2020-03-13 MED ORDER — ONDANSETRON HCL 4 MG/2ML IJ SOLN: 4.0000 mg | Freq: Four times a day (QID) | INTRAMUSCULAR | Status: DC | PRN

## 2020-03-13 MED ORDER — DEMECLOCYCLINE HCL 150 MG PO TABS
150.0000 mg | ORAL_TABLET | Freq: Two times a day (BID) | ORAL | Status: DC
  Administered 2020-03-13: 150 mg via ORAL
  Filled 2020-03-13 (×3): qty 1

## 2020-03-13 MED ORDER — HYDROCODONE-ACETAMINOPHEN 5-325 MG PO TABS: 1.0000 | ORAL_TABLET | Freq: Three times a day (TID) | ORAL | Status: DC | PRN

## 2020-03-13 MED ORDER — SODIUM CHLORIDE 0.9% FLUSH
3.0000 mL | Freq: Two times a day (BID) | INTRAVENOUS | Status: DC
  Administered 2020-03-13 – 2020-03-25 (×20): 3 mL via INTRAVENOUS

## 2020-03-13 MED ORDER — DILTIAZEM HCL ER 180 MG PO CP24
180.0000 mg | ORAL_CAPSULE | Freq: Every day | ORAL | Status: DC
  Filled 2020-03-13: qty 1

## 2020-03-13 MED ORDER — MORPHINE SULFATE (PF) 2 MG/ML IV SOLN: 2.0000 mg | INTRAVENOUS | Status: DC | PRN

## 2020-03-13 MED ORDER — ALPRAZOLAM 0.25 MG PO TABS
0.2500 mg | ORAL_TABLET | Freq: Every evening | ORAL | Status: DC | PRN
  Administered 2020-03-23: 0.25 mg via ORAL
  Filled 2020-03-13: qty 1

## 2020-03-13 MED ORDER — TAMSULOSIN HCL 0.4 MG PO CAPS
0.4000 mg | ORAL_CAPSULE | Freq: Two times a day (BID) | ORAL | Status: DC
  Administered 2020-03-13 – 2020-03-25 (×24): 0.4 mg via ORAL
  Filled 2020-03-13 (×24): qty 1

## 2020-03-13 MED ORDER — ACETAMINOPHEN 325 MG PO TABS
650.0000 mg | ORAL_TABLET | Freq: Four times a day (QID) | ORAL | Status: DC | PRN
  Filled 2020-03-13: qty 2

## 2020-03-13 MED ORDER — ONDANSETRON HCL 4 MG PO TABS: 4.0000 mg | ORAL_TABLET | Freq: Four times a day (QID) | ORAL | Status: DC | PRN

## 2020-03-13 MED ORDER — BISACODYL 5 MG PO TBEC: 5.0000 mg | DELAYED_RELEASE_TABLET | Freq: Every day | ORAL | Status: DC | PRN

## 2020-03-13 MED ORDER — POLYETHYLENE GLYCOL 3350 17 G PO PACK: 17.0000 g | PACK | Freq: Every day | ORAL | Status: DC | PRN

## 2020-03-13 MED ORDER — ZOLPIDEM TARTRATE 5 MG PO TABS
5.0000 mg | ORAL_TABLET | Freq: Every evening | ORAL | Status: DC | PRN
  Administered 2020-03-13 – 2020-03-24 (×12): 5 mg via ORAL
  Filled 2020-03-13 (×12): qty 1

## 2020-03-13 NOTE — ED Provider Notes (Addendum)
[facility] EMERGENCY DEPARTMENT Provider Note   CSN: [id number] Arrival date & time: 03/13/20  0932     History Chief Complaint  Patient presents with  . Fall    Anthony Curry is a 79 y.o. male.  79 year old male here after ground-level fall who is on Eliquis.  Was getting into a wheelchair and fell down and struck his head.  Unknown LOC.  EMS called and patient was reportedly asking repetitive questions.  Placed in c-collar.  Denies any other complaints.  Transported here for further management        No past medical history on file.  There are no problems to display for this patient.        No family history on file.  Social History   Tobacco Use  . Smoking status: Not on file  Substance Use Topics  . Alcohol use: Not on file  . Drug use: Not on file    Home Medications Prior to Admission medications   Not on File    Allergies    Patient has no allergy information on record.  Review of Systems   Review of Systems  All other systems reviewed and are negative.   Physical Exam Updated Vital Signs There were no vitals taken for this visit.  Physical Exam Vitals and nursing note reviewed.  Constitutional:      General: He is not in acute distress.    Appearance: Normal appearance. He is well-developed. He is not toxic-appearing.  HENT:     Head:   Eyes:     General: Lids are normal.     Conjunctiva/sclera: Conjunctivae normal.     Pupils: Pupils are equal, round, and reactive to light.  Neck:     Thyroid: No thyroid mass.     Trachea: No tracheal deviation.  Cardiovascular:     Rate and Rhythm: Normal rate and regular rhythm.     Heart sounds: Normal heart sounds. No murmur. No gallop.   Pulmonary:     Effort: Pulmonary effort is normal. No respiratory distress.     Breath sounds: No stridor. Decreased breath sounds and rhonchi present. No wheezing or rales.  Abdominal:     General: Bowel sounds are normal. There is no  distension.     Palpations: Abdomen is soft.     Tenderness: There is no abdominal tenderness. There is no rebound.  Musculoskeletal:        General: No tenderness. Normal range of motion.     Cervical back: Normal range of motion and neck supple.  Skin:    General: Skin is warm and dry.     Findings: No abrasion or rash.  Neurological:     Mental Status: He is alert and oriented to person, place, and time.     GCS: GCS eye subscore is 4. GCS verbal subscore is 5. GCS motor subscore is 6.     Cranial Nerves: No cranial nerve deficit, dysarthria or facial asymmetry.     Sensory: No sensory deficit.     Motor: No weakness or tremor.  Psychiatric:        Attention and Perception: Attention normal.        Speech: Speech normal.        Behavior: Behavior normal. Behavior is cooperative.     ED Results / Procedures / Treatments   Labs (all labs ordered are listed, but only abnormal results are displayed) Labs Reviewed - No data to display  EKG EKG  Interpretation  Date/Time:  Friday March 13 2020 09:38:08 EDT Ventricular Rate:  93 PR Interval:    QRS Duration: 112 QT Interval:  372 QTC Calculation: 456 R Axis:   93 Text Interpretation: Atrial fibrillation Ventricular premature complex Borderline intraventricular conduction delay Low voltage, extremity and precordial leads No old tracing to compare Confirmed by Lacretia Leigh ([id number]) on 03/13/2020 9:41:35 AM   Radiology No results found.  Procedures Procedures (including critical care time)  Medications Ordered in ED Medications - No data to display  ED Course  I have reviewed the triage vital signs and the nursing notes.  Pertinent labs & imaging results that were available during my care of the patient were reviewed by me and considered in my medical decision making (see chart for details).    MDM Rules/Calculators/A&P                     Patient's abrasion covered with a bandage.  Tetanus status is up-to-date. I had  a chance to speak with the patient further about his current symptoms.  Patient initially stated that he fell while trying to sit down the chair but upon further questioning it seems that he became weak.  Has had similar symptoms when he has had symptomatic hyponatremia.  Patient sodium is 124 today.  Had pneumonia about a month ago and his chest x-ray shows improving status.  Due to the patient's ongoing weakness I will consult hospitalist for admission Final Clinical Impression(s) / ED Diagnoses Final diagnoses:  None    Rx / DC Orders ED Discharge Orders    None       Lacretia Leigh, MD 03/13/20 1329    Lacretia Leigh, MD 03/13/20 [phone]

## 2020-03-13 NOTE — Progress Notes (Signed)
Orthopedic Tech Progress Note Patient Details:  Anthony Curry 11-25-1941 [id number]  Ortho Devices Ortho Device/Splint Location: Level 2 trauma alert       [place] Pink 03/13/2020, 9:54 AM

## 2020-03-13 NOTE — H&P (Signed)
History and Physical    Anthony Curry ZOX:[id number] DOB: 09-26-1941 DOA: 03/13/2020  PCP:  Dierdre Forth Consultants:  [facility] - cardiology; [facility] - oncology; [facility] Patient coming from: Home - lives with wife; Anthony Curry:  Wife, [phone]  Chief Complaint: fall  HPI: Anthony Curry is a 79 y.o. 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   Patient was previouslyhospitalized from1/28-2/5/21for perforated diverticulitis and underwent sigmoid colectomy with colostomy creation, hospital course complicated byCovid 19 pneumonia, urinary retention requiring indwelling foley,ischemic left hand requiringleft brachial embolectomy on 2/1by vascular surgery ( Dr Oneida Alar), on Eliquis since then.He was [facility]but states was not getting enough help there, felt miserable and couldn't stay there land left home the following day.  He was last hospitalized from 3/15-23 for fall resulting in hip fracture.  He was discharged to SNF.  He had a subsequent ER visit on 3/29 for dysuria and was given Keflex.  He was seen yesterday by Dr. Audie Box for moderate pericardial effusion.  Today, they were going to a urology appointment.  He was standing with his walker and he fell on his head.  He doesn't remember falling, doesn't think he lost consciousness - his wife talked to him while he was on the ground until the ambulance came.  He has had so much done to him in the last few weeks and he can't remember these things.  He has been feeling very dizzy, very tired.  He is not eating well, doesn't eat, doesn't sleep.  He has had a chronic cough for a month (rattling, happens all night long).  He has an indwelling foley now, for maybe a month - he had retention and had a foley and then he failed voiding trial.  He had  a broken hip and had a repair; that is healing well.  He felt dizzy this AM while in bed.     ED Course:  Sx hyponatremia - makes him weak.  Today, he fell during transfer to wheelchair. Struck his left body, abrasion to R forehead.  He CT ok and no significant injuries aside for abrasion.  Review of Systems: As per HPI; otherwise review of systems reviewed and negative.   Ambulatory Status:  Ambulates with a walker  COVID Vaccine Status:  None - has COVID in January  Past Medical History:  Diagnosis Date  . Atrial fibrillation, chronic ([place])   . Fall   . Hyponatremia   . Pericardial effusion   . Stage 4 lung cancer, unspecified laterality ([place])     History reviewed. No pertinent surgical history.  Social History   Socioeconomic History  . Marital status: Single    Spouse name: Not on file  . Number of children: Not on file  . Years of education: Not on file  . Highest education level: Not on file  Occupational History  . Occupation: retired Dealer  Tobacco Use  . Smoking status: Former Smoker    Packs/day: 1.00    Years: 30.00    Pack years: 30.00  . Smokeless tobacco: Never Used  Substance and Sexual Activity  . Alcohol use: Not Currently    Comment: h/o heavy use  . Drug use: Never  . Sexual activity: Not on file  Other Topics Concern  . Not on file  Social History Narrative  . Not on  file   Social Determinants of Health   Financial Resource Strain:   . Difficulty of Paying Living Expenses:   Food Insecurity:   . Worried About Charity fundraiser in the Last Year:   . Arboriculturist in the Last Year:   Transportation Needs:   . Film/video editor (Medical):   [name] Lack of Transportation (Non-Medical):   Physical Activity:   . Days of Exercise per Week:   . Minutes of Exercise per Session:   Stress:   . Feeling of Stress :   Social Connections:   . Frequency of Communication with Friends and Family:   . Frequency of Social Gatherings with Friends  and Family:   . Attends Religious Services:   . Active Member of Clubs or Organizations:   . Attends Archivist Meetings:   [name] Marital Status:   Intimate Partner Violence:   . Fear of Current or Ex-Partner:   . Emotionally Abused:   [name] Physically Abused:   . Sexually Abused:     Allergies  Allergen Reactions  . Aspirin Other (See Comments)    Cannot consume as a result of him taking Eliquis (Blood thinner)    History reviewed. No pertinent family history.  Prior to Admission medications   Medication Sig Start Date End Date Taking? Authorizing Provider  ALPRAZolam Duanne Moron) 0.25 MG tablet Take 0.25 mg by mouth at bedtime as needed for anxiety.   Yes [provider]  apixaban (ELIQUIS) 5 MG TABS tablet Take 5 mg by mouth 2 (two) times daily.   Yes [provider]  demeclocycline (DECLOMYCIN) 150 MG tablet Take 150 mg by mouth 2 (two) times daily.   Yes [provider]  diltiazem (DILACOR XR) 180 MG 24 hr capsule Take 180 mg by mouth daily.   Yes [provider]  HYDROcodone-acetaminophen (NORCO/VICODIN) 5-325 MG tablet Take 1 tablet by mouth 3 (three) times daily as needed for moderate pain.   Yes [provider]  levothyroxine (SYNTHROID) 50 MCG tablet Take 50 mcg by mouth daily before breakfast.   Yes [provider]  mirtazapine (REMERON) 30 MG tablet Take 30 mg by mouth at bedtime.   Yes [provider]  Nivolumab (OPDIVO IV) Inject into the vein See admin instructions. Inject as directed every month.   Yes [provider]  potassium chloride SA (KLOR-CON) 20 MEQ tablet Take 40 mEq by mouth 2 (two) times daily.    Yes [provider]  tamsulosin (FLOMAX) 0.4 MG CAPS capsule Take 0.4 mg by mouth in the morning and at bedtime.   Yes [provider]    Physical Exam: Vitals:   03/13/20 1445 03/13/20 1500 03/13/20 1703 03/13/20 1824  BP: (!) 140/58 138/70 (!) 159/74 (!) 162/68  Pulse:  83 83 80 91  Resp: (!) 28 (!) 25 18 16   Temp:    98.7 F (37.1 C)  TempSrc:    Oral  SpO2: 97% 98% 98% 97%  Weight:      Height:         . General:  Appears calm and comfortable and is NAD; intermittent coarse cough . Eyes:  PERRL, EOMI, normal lids, iris . ENT:  grossly normal hearing, lips & tongue, mmm . Neck:  no LAD, masses or thyromegaly . Cardiovascular:  RRR, no m/r/g. No LE edema.  [name] Respiratory:   CTA bilaterally with no wheezes/rales/rhonchi.  Normal respiratory effort. Coarse periodic cough . Abdomen:  soft, NT,  ND, NABS; ostomy in place . Back:   normal alignment, no CVAT . Skin:  no rash or induration seen on limited exam . Musculoskeletal:  grossly normal tone BUE/BLE, good ROM, no bony abnormality . Psychiatric:  grossly normal mood and affect, speech fluent and appropriate, AOx3 - he perseverates a bit, possible subtle AMS . Neurologic:  CN 2-12 grossly intact, moves all extremities in coordinated fashion    Radiological Exams on Admission: DG Chest 2 View  Result Date: 03/13/2020 CLINICAL DATA:  Ground level fall. Prior diagnosis of pneumonia 3-4 weeks ago with persistent cough. EXAM: CHEST - 2 VIEW COMPARISON:  02/22/2020 and 02/24/2020 chest radiograph. FINDINGS: Emphysema. Decreased density of left basilar/retrocardiac consolidative opacity. Mid to lower lung right consolidative opacities are less conspicuous. No pneumothorax. Trace left pleural effusion, less conspicuous than prior exam. Partially obscured cardiomediastinal silhouette. Aortic arch atherosclerotic calcifications. Sequela of left shoulder arthroplasty. Chronic multilevel right rib fracture deformities. Multilevel spondylosis. IMPRESSION: Decreased density of left basilar/retrocardiac consolidation. Trace left pleural effusion. Right mid to lower lung opacities are less conspicuous. Electronically Signed   By: Primitivo Gauze M.D.   On: 03/13/2020 10:41   CT Head Wo Contrast  Result Date:  03/13/2020 CLINICAL DATA:  [place]. Facial trauma. EXAM: CT HEAD WITHOUT CONTRAST CT MAXILLOFACIAL WITHOUT CONTRAST CT CERVICAL SPINE WITHOUT CONTRAST TECHNIQUE: Multidetector CT imaging of the head, cervical spine, and maxillofacial structures were performed using the standard protocol without intravenous contrast. Multiplanar CT image reconstructions of the cervical spine and maxillofacial structures were also generated. COMPARISON:  MRI brain 06/30/2019 and head CT from 2015. FINDINGS: CT HEAD FINDINGS Brain: Stable age related cerebral atrophy, ventriculomegaly and periventricular white matter disease. Scattered basal ganglia calcifications. No extra-axial fluid collections are identified. No CT findings for acute hemispheric infarction or intracranial hemorrhage. No mass lesions. The brainstem and cerebellum are normal. Vascular: Vascular calcifications but no aneurysm or hyperdense vessels. Skull: No skull fracture or bone lesions. Other: No scalp lesions or hematoma. CT MAXILLOFACIAL FINDINGS Osseous: Exam limited by patient motion but no definite facial bone fractures are identified. The mandibular condyles are normally located. No obvious mandible fracture. Orbits: The bony orbits are intact. Of the globes are intact. Sinuses: Patchy ethmoid sinus disease and partial opacification of the right half of the sphenoid sinus. The right maxillary sinus contains a mucous retention cyst or polyp. Soft tissues: No significant soft tissue findings. CT CERVICAL SPINE FINDINGS Alignment: Normal overall alignment. There is severe degenerative cervical spondylosis with multilevel disc disease and facet disease and mild multilevel degenerative subluxations. Skull base and vertebrae: Exam limited by motion artifact but no definite acute fractures. Soft tissues and spinal canal: No prevertebral fluid or swelling. No visible canal hematoma. Disc levels: The spinal canal is patent. Mild multilevel spinal and foraminal stenosis  but no large disc protrusions. Upper chest: Emphysematous changes are noted at the lung apices with dense scarring changes. Other: Carotid artery calcifications are noted. IMPRESSION: 1. Stable age related cerebral atrophy, ventriculomegaly and periventricular white matter disease. 2. No acute intracranial findings or skull fracture. 3. No definite acute facial bone fractures. 4. Severe degenerative cervical spondylosis with multilevel disc disease and facet disease but no definite acute cervical spine fracture. Electronically Signed   By: Marijo Sanes M.D.   On: 03/13/2020 10:09   CT CHEST W CONTRAST  Result Date: 03/13/2020 CLINICAL DATA:  Small cell lung cancer assess treatment response. Also experience ground level fall, on Eliquis. EXAM: CT CHEST, ABDOMEN, AND PELVIS  WITH CONTRAST TECHNIQUE: Multidetector CT imaging of the chest, abdomen and pelvis was performed following the standard protocol during bolus administration of intravenous contrast. CONTRAST:  159mL OMNIPAQUE IOHEXOL 300 MG/ML  SOLN COMPARISON:  01/23/2020 FINDINGS: CT CHEST FINDINGS Cardiovascular: Calcified and noncalcified atherosclerotic plaque throughout the aorta its branches in the chest. No aneurysmal dilation of thoracic aorta. Are stable in terms of mild dilation. Enlarging pericardial effusion, 2 cm thickness along the mid cardiac contour previously approximately 1 cm. Mediastinum/Nodes: Limited assessment of thoracic inlet structures due to motion artifact. No axillary lymphadenopathy. No hilar lymphadenopathy. No mediastinal lymphadenopathy. Esophagus grossly normal. Lungs/Pleura: Bronchial wall thickening at the left hilum with signs of lower lobe bronchiectasis, this is developed since the previous study. Small left-sided effusion is enlarged. Small right effusion with basilar volume loss. Multifocal areas of ground-glass nodularity within aerated portions of the chest and areas of part solid nodules, similar to the prior study.  (Image 47, series 4) 2.8 x 1.8 cm, previously 2.9 x 1.6 cm. Solid component appears similar. Ground-glass with increased soft tissue density with subpleural bandlike opacity that has developed amidst ground-glass changes in the right upper lobe (image 57, series 4) 1.7 by 0.7 cm, no solid-appearing abnormality was present in this location on the previous exam. Pure ground-glass lesion in the left upper lobe shows no solid component on today's study measuring approximately 2.4 cm in greatest axial dimension appearing more conspicuous but unchanged in terms of size. Areas of volume loss and opacity in the right lower lobe show increasing density along the superior aspect in the superior segment of the right lower lobe (image 78, series 4) 2.5 x 1.6 cm, this area previously measured approximately 2 point 2 by 1.2 cm. Background pulmonary emphysema. Musculoskeletal: No chest wall mass. See below for full musculoskeletal details. CT ABDOMEN PELVIS FINDINGS Hepatobiliary: Small hyperdense focus in the dome of the right hemi liver approximately cm unchanged likely a small hemangioma. Low-density focus along the margin of the inferior right hemi liver anteriorly (image 63, series 7) 8 mm, unchanged as is another area of low-density in hepatic subsegment VI measuring 15 mm. Pancreas: Pancreas is normal without focal lesion or ductal dilation. Spleen: Spleen is normal size without focal lesion. Adrenals/Urinary Tract: Adrenal glands with mild thickening of the right adrenal gland unchanged. Left adrenal gland is normal. Renal enhancement is symmetric with some scarring along the lateral cortex of the right kidney. Small low-density foci in the left kidney are likely small cysts and unchanged. No hydronephrosis. Stomach/Bowel: No acute small bowel process. 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 Stool fills much of the colon. The appendix is normal. Vascular/Lymphatic: Calcified and  noncalcified plaque of the abdominal aorta without aneurysm. No abdominal adenopathy. No pelvic adenopathy. Reproductive: Prostate is small. Foley catheter in the urinary bladder. Other: Left lower quadrant colostomy. No free air. No ascites. Musculoskeletal: Signs of previous trauma to the right chest but with new right-sided rib fractures involving the right fourth, sixth, seventh and eighth ribs posteriorly. Acute fracture also of anterior right sixth rib. Sternum is intact. Costochondral elements are intact. Marked glenohumeral degenerative changes on the right with post operative changes of left shoulder arthroplasty as before. Signs of prior rib fracture on the left as well with similar appearance to the previous study. Multilevel spinal degenerative change. IMPRESSION: 1. Increased pericardial effusion. Echocardiography may be helpful given moderate to marked increase in size since previous examination. 2. Signs of previous trauma to  the right chest but with new right-sided rib fractures involving the right fourth, sixth, seventh and eighth ribs posteriorly. 3. Increasing left effusion with bronchial wall thickening in the left lung base. Findings could be related to aspiration pneumonitis and are associated with airspace disease on today's study. 4. Increasing nodularity in the right chest. Correlate with any ongoing radiotherapy. Pneumonitis or worsening of disease is also considered. Short interval follow-up may be helpful as findings are of uncertain significance in the setting of trauma with right-sided rib fractures. 5. Areas also in the right lower lobe that warrant attention on follow-up, potentially related to posttraumatic changes or inflammation though disease worsening is also considered. 6. Small left-sided effusion is enlarged. Electronically Signed   By: Zetta Bills M.D.   On: 03/13/2020 17:20   CT Cervical Spine Wo Contrast  Result Date: 03/13/2020 CLINICAL DATA:  [place]. Facial trauma.  EXAM: CT HEAD WITHOUT CONTRAST CT MAXILLOFACIAL WITHOUT CONTRAST CT CERVICAL SPINE WITHOUT CONTRAST TECHNIQUE: Multidetector CT imaging of the head, cervical spine, and maxillofacial structures were performed using the standard protocol without intravenous contrast. Multiplanar CT image reconstructions of the cervical spine and maxillofacial structures were also generated. COMPARISON:  MRI brain 06/30/2019 and head CT from 2015. FINDINGS: CT HEAD FINDINGS Brain: Stable age related cerebral atrophy, ventriculomegaly and periventricular white matter disease. Scattered basal ganglia calcifications. No extra-axial fluid collections are identified. No CT findings for acute hemispheric infarction or intracranial hemorrhage. No mass lesions. The brainstem and cerebellum are normal. Vascular: Vascular calcifications but no aneurysm or hyperdense vessels. Skull: No skull fracture or bone lesions. Other: No scalp lesions or hematoma. CT MAXILLOFACIAL FINDINGS Osseous: Exam limited by patient motion but no definite facial bone fractures are identified. The mandibular condyles are normally located. No obvious mandible fracture. Orbits: The bony orbits are intact. Of the globes are intact. Sinuses: Patchy ethmoid sinus disease and partial opacification of the right half of the sphenoid sinus. The right maxillary sinus contains a mucous retention cyst or polyp. Soft tissues: No significant soft tissue findings. CT CERVICAL SPINE FINDINGS Alignment: Normal overall alignment. There is severe degenerative cervical spondylosis with multilevel disc disease and facet disease and mild multilevel degenerative subluxations. Skull base and vertebrae: Exam limited by motion artifact but no definite acute fractures. Soft tissues and spinal canal: No prevertebral fluid or swelling. No visible canal hematoma. Disc levels: The spinal canal is patent. Mild multilevel spinal and foraminal stenosis but no large disc protrusions. Upper chest:  Emphysematous changes are noted at the lung apices with dense scarring changes. Other: Carotid artery calcifications are noted. IMPRESSION: 1. Stable age related cerebral atrophy, ventriculomegaly and periventricular white matter disease. 2. No acute intracranial findings or skull fracture. 3. No definite acute facial bone fractures. 4. Severe degenerative cervical spondylosis with multilevel disc disease and facet disease but no definite acute cervical spine fracture. Electronically Signed   By: Marijo Sanes M.D.   On: 03/13/2020 10:09   CT ABDOMEN PELVIS W CONTRAST  Result Date: 03/13/2020 CLINICAL DATA:  Small cell lung cancer assess treatment response. Also experience ground level fall, on Eliquis. EXAM: CT CHEST, ABDOMEN, AND PELVIS WITH CONTRAST TECHNIQUE: Multidetector CT imaging of the chest, abdomen and pelvis was performed following the standard protocol during bolus administration of intravenous contrast. CONTRAST:  166mL OMNIPAQUE IOHEXOL 300 MG/ML  SOLN COMPARISON:  01/23/2020 FINDINGS: CT CHEST FINDINGS Cardiovascular: Calcified and noncalcified atherosclerotic plaque throughout the aorta its branches in the chest. No aneurysmal dilation of thoracic aorta. Are  stable in terms of mild dilation. Enlarging pericardial effusion, 2 cm thickness along the mid cardiac contour previously approximately 1 cm. Mediastinum/Nodes: Limited assessment of thoracic inlet structures due to motion artifact. No axillary lymphadenopathy. No hilar lymphadenopathy. No mediastinal lymphadenopathy. Esophagus grossly normal. Lungs/Pleura: Bronchial wall thickening at the left hilum with signs of lower lobe bronchiectasis, this is developed since the previous study. Small left-sided effusion is enlarged. Small right effusion with basilar volume loss. Multifocal areas of ground-glass nodularity within aerated portions of the chest and areas of part solid nodules, similar to the prior study. (Image 47, series 4) 2.8 x 1.8 cm,  previously 2.9 x 1.6 cm. Solid component appears similar. Ground-glass with increased soft tissue density with subpleural bandlike opacity that has developed amidst ground-glass changes in the right upper lobe (image 57, series 4) 1.7 by 0.7 cm, no solid-appearing abnormality was present in this location on the previous exam. Pure ground-glass lesion in the left upper lobe shows no solid component on today's study measuring approximately 2.4 cm in greatest axial dimension appearing more conspicuous but unchanged in terms of size. Areas of volume loss and opacity in the right lower lobe show increasing density along the superior aspect in the superior segment of the right lower lobe (image 78, series 4) 2.5 x 1.6 cm, this area previously measured approximately 2 point 2 by 1.2 cm. Background pulmonary emphysema. Musculoskeletal: No chest wall mass. See below for full musculoskeletal details. CT ABDOMEN PELVIS FINDINGS Hepatobiliary: Small hyperdense focus in the dome of the right hemi liver approximately cm unchanged likely a small hemangioma. Low-density focus along the margin of the inferior right hemi liver anteriorly (image 63, series 7) 8 mm, unchanged as is another area of low-density in hepatic subsegment VI measuring 15 mm. Pancreas: Pancreas is normal without focal lesion or ductal dilation. Spleen: Spleen is normal size without focal lesion. Adrenals/Urinary Tract: Adrenal glands with mild thickening of the right adrenal gland unchanged. Left adrenal gland is normal. Renal enhancement is symmetric with some scarring along the lateral cortex of the right kidney. Small low-density foci in the left kidney are likely small cysts and unchanged. No hydronephrosis. Stomach/Bowel: No acute small bowel process. 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 Stool fills much of the colon. The appendix is normal. Vascular/Lymphatic: Calcified and noncalcified plaque of the abdominal aorta  without aneurysm. No abdominal adenopathy. No pelvic adenopathy. Reproductive: Prostate is small. Foley catheter in the urinary bladder. Other: Left lower quadrant colostomy. No free air. No ascites. Musculoskeletal: Signs of previous trauma to the right chest but with new right-sided rib fractures involving the right fourth, sixth, seventh and eighth ribs posteriorly. Acute fracture also of anterior right sixth rib. Sternum is intact. Costochondral elements are intact. Marked glenohumeral degenerative changes on the right with post operative changes of left shoulder arthroplasty as before. Signs of prior rib fracture on the left as well with similar appearance to the previous study. Multilevel spinal degenerative change. IMPRESSION: 1. Increased pericardial effusion. Echocardiography may be helpful given moderate to marked increase in size since previous examination. 2. Signs of previous trauma to the right chest but with new right-sided rib fractures involving the right fourth, sixth, seventh and eighth ribs posteriorly. 3. Increasing left effusion with bronchial wall thickening in the left lung base. Findings could be related to aspiration pneumonitis and are associated with airspace disease on today's study. 4. Increasing nodularity in the right chest. Correlate with any ongoing radiotherapy. Pneumonitis  or worsening of disease is also considered. Short interval follow-up may be helpful as findings are of uncertain significance in the setting of trauma with right-sided rib fractures. 5. Areas also in the right lower lobe that warrant attention on follow-up, potentially related to posttraumatic changes or inflammation though disease worsening is also considered. 6. Small left-sided effusion is enlarged. Electronically Signed   By: Zetta Bills M.D.   On: 03/13/2020 17:20   DG Hand 2 View Left  Result Date: 03/13/2020 CLINICAL DATA:  [place]. Thumb pain. EXAM: LEFT HAND - 2 VIEW COMPARISON:  None. 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 This is likely to be painful. Ordinary degenerative arthritis at the MCP joints of the thumb, index and long fingers. Mild inter phalangeal joint osteoarthritis. IMPRESSION: 1. 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 2. Ordinary degenerative arthritis of the MCP joints of the thumb, index and long fingers. Electronically Signed   By: Nelson Chimes M.D.   On: 03/13/2020 11:42   DG HIP UNILAT WITH PELVIS 2-3 VIEWS LEFT  Result Date: 03/13/2020 CLINICAL DATA:  Pain following fall.  History of lung carcinoma EXAM: DG HIP (WITH OR WITHOUT PELVIS) 2-3V LEFT COMPARISON:  None. FINDINGS: Frontal pelvis as well as frontal and lateral left hip images were obtained. Bones are osteoporotic. 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 No acute fracture or dislocation. There is degenerative change in the lower lumbar spine. There is mild narrowing of the right hip joint. 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 No fracture or dislocation. 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 Electronically Signed   By: Lowella Grip III M.D.   On: 03/13/2020 10:35   CT Maxillofacial WO CM  Result Date: 03/13/2020 CLINICAL DATA:  [place]. Facial trauma. EXAM: CT HEAD WITHOUT CONTRAST CT MAXILLOFACIAL WITHOUT CONTRAST CT CERVICAL SPINE WITHOUT CONTRAST TECHNIQUE: Multidetector CT imaging of the head, cervical spine, and maxillofacial structures were performed using the standard protocol without intravenous contrast. Multiplanar CT image reconstructions of the cervical spine and maxillofacial structures were also generated. COMPARISON:  MRI brain 06/30/2019 and head CT from 2015. FINDINGS: CT HEAD FINDINGS Brain: Stable age related  cerebral atrophy, ventriculomegaly and periventricular white matter disease. Scattered basal ganglia calcifications. No extra-axial fluid collections are identified. No CT findings for acute hemispheric infarction or intracranial hemorrhage. No mass lesions. The brainstem and cerebellum are normal. Vascular: Vascular calcifications but no aneurysm or hyperdense vessels. Skull: No skull fracture or bone lesions. Other: No scalp lesions or hematoma. CT MAXILLOFACIAL FINDINGS Osseous: Exam limited by patient motion but no definite facial bone fractures are identified. The mandibular condyles are normally located. No obvious mandible fracture. Orbits: The bony orbits are intact. Of the globes are intact. Sinuses: Patchy ethmoid sinus disease and partial opacification of the right half of the sphenoid sinus. The right maxillary sinus contains a mucous retention cyst or polyp. Soft tissues: No significant soft tissue findings. CT CERVICAL SPINE FINDINGS Alignment: Normal overall alignment. There is severe degenerative cervical spondylosis with multilevel disc disease and facet disease and mild multilevel degenerative subluxations. Skull base and vertebrae: Exam limited by motion artifact but no definite acute fractures. Soft tissues and spinal canal: No prevertebral fluid or swelling. No visible canal hematoma. Disc levels: The spinal canal is patent. Mild multilevel spinal and foraminal stenosis but  no large disc protrusions. Upper chest: Emphysematous changes are noted at the lung apices with dense scarring changes. Other: Carotid artery calcifications are noted. IMPRESSION: 1. Stable age related cerebral atrophy, ventriculomegaly and periventricular white matter disease. 2. No acute intracranial findings or skull fracture. 3. No definite acute facial bone fractures. 4. Severe degenerative cervical spondylosis with multilevel disc disease and facet disease but no definite acute cervical spine fracture. Electronically  Signed   By: Marijo Sanes M.D.   On: 03/13/2020 10:09    EKG: Independently reviewed.  Afib with rate 93; nonspecific ST changes with no evidence of acute ischemia   Labs on Admission: I have personally reviewed the available labs and imaging studies at the time of the admission.  Pertinent labs:   Na++ 124; 130 on 3/30, generally 130-132 Albumin 2.9 WBC 9.3 Hgb 11.8 Urine culture from 3/30 with 40k colonies of yeast, no bacteria - Keflex stopped    Assessment/Plan Active Problems:   Hyponatremia   Stage 4 lung cancer, unspecified laterality (HCC)   Pericardial effusion   Atrial fibrillation, chronic ([place])   Fall (on)(from) sidewalk curb, initial encounter   Multiple fractures of rib involving four or more ribs   Diverticulitis   Hypothyroidism (acquired)   Fall, likely associated with symptomatic hyponatremia -Patient with recurrent falls including with hip fracture s/p repair on 3/16 -He was standing with his walker today and collapsed; he does not remember the event and is uncertain why his fell but his wife reports no LOC -He has a h/o hyponatremia, but his usual baseline is 130-132 and he is currently 53; this is thought to be contributing to his weakness -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   -No signs of tamponade currently.   -he was seen for this issue by cardiology yesterday -CT today with concern for worsening and so STAT echo was ordered and is pending -I have discussed the patient with Dr. Margaretann Loveless and she or Dr. 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 [name] to the treatment team and I sent him a secure chat message today asking if he can see the patient tomorrow to review the scans with him -Once again, there is noted concern for possible aspiration pneumonitis - which could be contributing to his cough; will make NPO and plan for ST swallow evaluation tomorrow  R posterior rib fractures -Patient c/o R-sided back pain just below his scapula with cough -Imaging indicates posterior right-sided rib fractures - which is likely the source of his pain -Will give pain control with Vicodin, Morphine, and add Lidocaine patch  Urinary retention -Has indwelling foley -Failed his last voiding trial  and he was scheduled to f/u with urology today -Will leave foley in for now -Continue Flomax  Afib -On Eliquis, will continue -Rate control with Diltiazem  Complicated diverticulitis -s/p recent ostomy  Hypothyroidism -Continue Synthroid  Recent COVID-19 infection -Patient with no current symptoms associated with recent infection -Repeat testing today is negative   Note: This patient has been tested and is negative for the novel coronavirus COVID-19.  DVT prophylaxis: Eliquis Code Status:  DNR - confirmed with patient/family Family Communication: Wife was present throughout evaluation Disposition Plan: He would benefit from d/c back to SNF but has been there twice recently and left very quickly both times; it appears unlikely he will agree to return. Consults called: Cardiology Admission status: It is my clinical  opinion that referral for OBSERVATION is reasonable and necessary in this patient based on the above information provided. The aforementioned taken together are felt to place the patient at high risk for further clinical deterioration. However it is anticipated that the patient may be medically stable for discharge from the hospital within 24 to 48 hours.    Karmen Bongo MD [facility]   How to contact the [facility] Attending or Consulting provider [place] or covering provider during after hours [place], for this patient?  1. Check the care team in [facility] and look for a) attending/consulting [facility] provider listed and b) the [facility] team listed 2. Log into [email or web address] and use [place]'s universal password to access. If you do not have the password, please contact the hospital operator. 3. Locate the [facility] provider you are looking for under [facility] and page to a number that you can be directly reached. 4. If you still have difficulty reaching the provider, please page the [facility] (Director on Call) for the Hospitalists listed on amion for assistance.   03/13/2020, 7:25 PM

## 2020-03-13 NOTE — Progress Notes (Signed)
  Echocardiogram 2D Echocardiogram has been performed.  Anthony Curry 03/13/2020, 8:23 PM

## 2020-03-13 NOTE — ED Triage Notes (Signed)
Ground level fall trying to get into wheelchair and hit head above rt eye brow. Takes eliquis and repetitive with questions. c-collar. Denies neck or back pain. A&O person place and time. 168/77, 85hr. Wife is otw.

## 2020-03-14 ENCOUNTER — Inpatient Hospital Stay (HOSPITAL_COMMUNITY): Payer: Medicare Other

## 2020-03-14 DIAGNOSIS — G4700 Insomnia, unspecified: Secondary | ICD-10-CM | POA: Diagnosis present

## 2020-03-14 DIAGNOSIS — W050XXA Fall from non-moving wheelchair, initial encounter: Secondary | ICD-10-CM | POA: Diagnosis present

## 2020-03-14 DIAGNOSIS — M1712 Unilateral primary osteoarthritis, left knee: Secondary | ICD-10-CM | POA: Diagnosis present

## 2020-03-14 DIAGNOSIS — B948 Sequelae of other specified infectious and parasitic diseases: Secondary | ICD-10-CM | POA: Diagnosis not present

## 2020-03-14 DIAGNOSIS — F329 Major depressive disorder, single episode, unspecified: Secondary | ICD-10-CM | POA: Diagnosis present

## 2020-03-14 DIAGNOSIS — J189 Pneumonia, unspecified organism: Secondary | ICD-10-CM | POA: Diagnosis not present

## 2020-03-14 DIAGNOSIS — Z933 Colostomy status: Secondary | ICD-10-CM | POA: Diagnosis not present

## 2020-03-14 DIAGNOSIS — M109 Gout, unspecified: Secondary | ICD-10-CM | POA: Diagnosis not present

## 2020-03-14 DIAGNOSIS — J690 Pneumonitis due to inhalation of food and vomit: Secondary | ICD-10-CM | POA: Diagnosis present

## 2020-03-14 DIAGNOSIS — I482 Chronic atrial fibrillation, unspecified: Secondary | ICD-10-CM | POA: Diagnosis present

## 2020-03-14 DIAGNOSIS — Z8616 Personal history of COVID-19: Secondary | ICD-10-CM | POA: Diagnosis not present

## 2020-03-14 DIAGNOSIS — K5792 Diverticulitis of intestine, part unspecified, without perforation or abscess without bleeding: Secondary | ICD-10-CM | POA: Diagnosis not present

## 2020-03-14 DIAGNOSIS — R64 Cachexia: Secondary | ICD-10-CM | POA: Diagnosis present

## 2020-03-14 DIAGNOSIS — C3490 Malignant neoplasm of unspecified part of unspecified bronchus or lung: Secondary | ICD-10-CM | POA: Diagnosis present

## 2020-03-14 DIAGNOSIS — E871 Hypo-osmolality and hyponatremia: Secondary | ICD-10-CM | POA: Diagnosis present

## 2020-03-14 DIAGNOSIS — M25559 Pain in unspecified hip: Secondary | ICD-10-CM | POA: Diagnosis not present

## 2020-03-14 DIAGNOSIS — Y846 Urinary catheterization as the cause of abnormal reaction of the patient, or of later complication, without mention of misadventure at the time of the procedure: Secondary | ICD-10-CM | POA: Diagnosis not present

## 2020-03-14 DIAGNOSIS — S0081XA Abrasion of other part of head, initial encounter: Secondary | ICD-10-CM | POA: Diagnosis present

## 2020-03-14 DIAGNOSIS — S2249XA Multiple fractures of ribs, unspecified side, initial encounter for closed fracture: Secondary | ICD-10-CM | POA: Diagnosis not present

## 2020-03-14 DIAGNOSIS — M25462 Effusion, left knee: Secondary | ICD-10-CM | POA: Diagnosis not present

## 2020-03-14 DIAGNOSIS — J9601 Acute respiratory failure with hypoxia: Secondary | ICD-10-CM | POA: Diagnosis present

## 2020-03-14 DIAGNOSIS — B3749 Other urogenital candidiasis: Secondary | ICD-10-CM | POA: Diagnosis not present

## 2020-03-14 DIAGNOSIS — I313 Pericardial effusion (noninflammatory): Secondary | ICD-10-CM | POA: Diagnosis present

## 2020-03-14 DIAGNOSIS — R490 Dysphonia: Secondary | ICD-10-CM | POA: Diagnosis present

## 2020-03-14 DIAGNOSIS — Z87891 Personal history of nicotine dependence: Secondary | ICD-10-CM | POA: Diagnosis not present

## 2020-03-14 DIAGNOSIS — Z681 Body mass index (BMI) 19 or less, adult: Secondary | ICD-10-CM | POA: Diagnosis not present

## 2020-03-14 DIAGNOSIS — E43 Unspecified severe protein-calorie malnutrition: Secondary | ICD-10-CM | POA: Diagnosis present

## 2020-03-14 DIAGNOSIS — W101XXA Fall (on)(from) sidewalk curb, initial encounter: Secondary | ICD-10-CM | POA: Diagnosis not present

## 2020-03-14 DIAGNOSIS — Z515 Encounter for palliative care: Secondary | ICD-10-CM | POA: Diagnosis not present

## 2020-03-14 DIAGNOSIS — Y92239 Unspecified place in hospital as the place of occurrence of the external cause: Secondary | ICD-10-CM | POA: Diagnosis present

## 2020-03-14 DIAGNOSIS — T83511A Infection and inflammatory reaction due to indwelling urethral catheter, initial encounter: Secondary | ICD-10-CM | POA: Diagnosis not present

## 2020-03-14 DIAGNOSIS — L89151 Pressure ulcer of sacral region, stage 1: Secondary | ICD-10-CM | POA: Diagnosis present

## 2020-03-14 DIAGNOSIS — F419 Anxiety disorder, unspecified: Secondary | ICD-10-CM | POA: Diagnosis present

## 2020-03-14 DIAGNOSIS — R296 Repeated falls: Secondary | ICD-10-CM | POA: Diagnosis present

## 2020-03-14 DIAGNOSIS — Z7901 Long term (current) use of anticoagulants: Secondary | ICD-10-CM | POA: Diagnosis not present

## 2020-03-14 DIAGNOSIS — R5381 Other malaise: Secondary | ICD-10-CM | POA: Diagnosis present

## 2020-03-14 DIAGNOSIS — E039 Hypothyroidism, unspecified: Secondary | ICD-10-CM | POA: Diagnosis not present

## 2020-03-14 DIAGNOSIS — Z66 Do not resuscitate: Secondary | ICD-10-CM | POA: Diagnosis present

## 2020-03-14 DIAGNOSIS — B370 Candidal stomatitis: Secondary | ICD-10-CM | POA: Diagnosis present

## 2020-03-14 DIAGNOSIS — Z886 Allergy status to analgesic agent status: Secondary | ICD-10-CM | POA: Diagnosis not present

## 2020-03-14 DIAGNOSIS — Z923 Personal history of irradiation: Secondary | ICD-10-CM | POA: Diagnosis not present

## 2020-03-14 DIAGNOSIS — R0600 Dyspnea, unspecified: Secondary | ICD-10-CM | POA: Diagnosis not present

## 2020-03-14 DIAGNOSIS — Z9981 Dependence on supplemental oxygen: Secondary | ICD-10-CM | POA: Diagnosis not present

## 2020-03-14 DIAGNOSIS — M549 Dorsalgia, unspecified: Secondary | ICD-10-CM | POA: Diagnosis present

## 2020-03-14 DIAGNOSIS — I4819 Other persistent atrial fibrillation: Secondary | ICD-10-CM | POA: Diagnosis present

## 2020-03-14 DIAGNOSIS — J90 Pleural effusion, not elsewhere classified: Secondary | ICD-10-CM | POA: Diagnosis present

## 2020-03-14 DIAGNOSIS — S2241XA Multiple fractures of ribs, right side, initial encounter for closed fracture: Secondary | ICD-10-CM | POA: Diagnosis present

## 2020-03-14 DIAGNOSIS — W19XXXD Unspecified fall, subsequent encounter: Secondary | ICD-10-CM | POA: Diagnosis present

## 2020-03-14 DIAGNOSIS — Z7189 Other specified counseling: Secondary | ICD-10-CM | POA: Diagnosis not present

## 2020-03-14 DIAGNOSIS — E876 Hypokalemia: Secondary | ICD-10-CM | POA: Diagnosis present

## 2020-03-14 DIAGNOSIS — R05 Cough: Secondary | ICD-10-CM | POA: Diagnosis not present

## 2020-03-14 LAB — BASIC METABOLIC PANEL
Anion gap: 8 (ref 5–15)
BUN: 8 mg/dL (ref 8–23)
CO2: 23 mmol/L (ref 22–32)
Calcium: 8.4 mg/dL — ABNORMAL LOW (ref 8.9–10.3)
Chloride: 94 mmol/L — ABNORMAL LOW (ref 98–111)
Creatinine, Ser: 0.59 mg/dL — ABNORMAL LOW (ref 0.61–1.24)
GFR calc Af Amer: >60 mL/min (ref >60)
GFR calc non Af Amer: >60 mL/min (ref >60)
Glucose, Bld: 98 mg/dL (ref 70–99)
Potassium: 4.4 mmol/L (ref 3.5–5.1)
Sodium: 125 mmol/L — ABNORMAL LOW (ref 135–145)

## 2020-03-14 LAB — NA AND K (SODIUM & POTASSIUM), RAND UR
Potassium Urine: 14 mmol/L
Sodium, Ur: 56 mmol/L

## 2020-03-14 LAB — CBC
HCT: 34.9 % — ABNORMAL LOW (ref 39.0–52.0)
Hemoglobin: 11.8 g/dL — ABNORMAL LOW (ref 13.0–17.0)
MCH: 28.9 pg (ref 26.0–34.0)
MCHC: 33.8 g/dL (ref 30.0–36.0)
MCV: 85.5 fL (ref 80.0–100.0)
Platelets: 386 10*3/uL (ref 150–400)
RBC: 4.08 MIL/uL — ABNORMAL LOW (ref 4.22–5.81)
RDW: 14.8 % (ref 11.5–15.5)
WBC: 7.8 10*3/uL (ref 4.0–10.5)
nRBC: 0 % (ref 0.0–0.2)

## 2020-03-14 LAB — OSMOLALITY, URINE: Osmolality, Ur: 222 mOsm/kg — ABNORMAL LOW (ref 300–900)

## 2020-03-14 MED ORDER — DM-GUAIFENESIN ER 30-600 MG PO TB12
1.0000 | ORAL_TABLET | Freq: Two times a day (BID) | ORAL | Status: DC
  Administered 2020-03-14 – 2020-03-25 (×23): 1 via ORAL
  Filled 2020-03-14 (×6): qty 1
  Filled 2020-03-14: qty 2
  Filled 2020-03-14 (×16): qty 1

## 2020-03-14 MED ORDER — CHLORHEXIDINE GLUCONATE CLOTH 2 % EX PADS
6.0000 | MEDICATED_PAD | Freq: Every day | CUTANEOUS | Status: DC
  Administered 2020-03-14 – 2020-03-25 (×11): 6 via TOPICAL

## 2020-03-14 MED ORDER — IPRATROPIUM-ALBUTEROL 0.5-2.5 (3) MG/3ML IN SOLN
3.0000 mL | Freq: Four times a day (QID) | RESPIRATORY_TRACT | Status: DC | PRN
  Administered 2020-03-15 (×2): 3 mL via RESPIRATORY_TRACT
  Filled 2020-03-14: qty 3

## 2020-03-14 MED ORDER — DEMECLOCYCLINE HCL 150 MG PO TABS
300.0000 mg | ORAL_TABLET | Freq: Two times a day (BID) | ORAL | Status: DC
  Administered 2020-03-14 – 2020-03-25 (×24): 300 mg via ORAL
  Filled 2020-03-14 (×12): qty 2
  Filled 2020-03-14: qty 1
  Filled 2020-03-14 (×14): qty 2

## 2020-03-14 NOTE — Progress Notes (Signed)
Very restless, unable to settle down intermittent moist cough. Oxygen saturation in 90's, MD made aware, pain meds given and repositioned to help establish some comfort in patient.

## 2020-03-14 NOTE — Progress Notes (Signed)
PT Cancellation Note  Patient Details Name: Anthony Curry MRN: [id number] DOB: 08-17-1941   Cancelled Treatment:      Therapy received order for imminent discharge evaluation. Per nursing the patient is not appropriate today for evaluation. Nursing requested that therapy come back on 03/15/2020   Carney Living PT DPT  03/14/2020, 2:48 PM

## 2020-03-14 NOTE — Progress Notes (Signed)
Patient reports recent left hip surgery. Left knee is swollen proximally and warm to the touch compared to the other side. Patient reports this swelling is new. Dr. Vista Lawman aware.

## 2020-03-14 NOTE — Progress Notes (Addendum)
Patient experiencing new onset of intermittent confusion. Disoriented to place, time, and situation. Pt reoriented. Bed alarm and safety precautions remain in place with family at bedside. Osei-Bonsu MD notified via page.   MD notified again at 1753 for confusion via secure chat this time. Messages marked as read by provider. No response received.

## 2020-03-14 NOTE — Progress Notes (Signed)
Sodium 125, DR blount paged and made aware- no new orders. IVF continue to infuse,

## 2020-03-14 NOTE — Progress Notes (Signed)
Patient alert with intermittent confusion, reoriented and redirected as needed, safety measures reinforced.Bed alarm in use, close observation by staff. No complaint of pain-colostomy with flatus no BM,foley to graivty large amount of clear yellow urine, IVF via peripheral access as per order. [place] continue to monitor for any changes, tele monitoring

## 2020-03-14 NOTE — Consult Note (Addendum)
Cardiology Consultation:   Patient ID: Anthony Curry; [id number]; 03-Jul-1941   Admit date: 03/13/2020 Date of Consult: 03/14/2020  Primary Care Provider: Patient, No Pcp Per Primary Cardiologist: Dr. Eleonore Chiquito   Patient Profile:   Anthony Curry is a 79 y.o. 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 Lorin Mercy.  History of Present Illness:   [name] is an 79yo M with a hx as stated above who presented to [facility] on 03/13/2020 with a fall from his wheelchair. Because of the fall, a CT chest was performed, and his known, chronic pericardial effusion was again visualized, but felt to have enlarged. Cardiology was called  On 03/13/2020 at 5:30 pm after primary service was notified by radiology. I returned the call immediately to Dr. Lorin Mercy, and she informed me that she had ordered a stat echocardiogram. The patient was described as stable hemodynamically, and Dr. Lorin Mercy and I determined that we would obtain the echocardiogram prior to cardiology evaluating the patient given that the patient was felt to be stable at his cardiologist appointment on 4/8 and was not presenting for signs or symptoms of tamponade. My colleague Dr. Harl Bowie interpreted the echocardiogram and felt that it was not indicative of tamponade physiology. On call cardiology determined that urgent consultation was not indicated. We are seeing the patient today at the request of Dr. 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   He was recently seen by his primary cardiologist Dr. Audie Box on 03/12/2020, day prior to admission, for follow-up of pericardial effusion.  He was initially seen 12/2019 after being diagnosed with COVID-19 found to have a moderate pericardial effusion without evidence of tamponade.  He was then  hospitalized 01/2020 for an upper extremity arterial thrombotic event left brachial embolectomy on 01/06/2020 per Dr. Oneida Alar with recommendations to remain on Eliquis indefinitely.  02/2020 he was admitted for hip fracture for which he underwent repair/ Apparently his chemotherapy for his stage IV lung cancer has been on hold because of this.  On last assessment with Dr. Audie Box he continued to be profoundly weak with dyspnea on exertion.  He was doing rehabilitation at home.  EKG with normal sinus rhythm with no evidence of recurrent atrial fibrillation.  It was felt that his dyspnea was secondary to severe deconditioning from multiple hospitalizations and metastatic lung cancer.  There were no changes made at his follow-up appointment.  He then presented to [facility] on 03/13/2020 s/p mechanical fall from his wheelchair. Per report, patient was going to a urology appointment however unfortunately sustained a fall while standing with his walker for which he hit his head.  He does not remember falling but does not feel that he lost consciousness.  He is chronically ill and has been having ongoing issues with fatigue, poor appetite and dizziness.  On ED arrival, head CT with no acute changes.  Sodium level found to be lower than his baseline at 124 thought to be contributing to his weakness.  On CT, there was concern for worsening pericardial effusion therefore stat echo was ordered and 03/13/2020 which shows a slightly larger pericardial effusion when compared to study performed 01/08/2020.  Effusion measures 1.6-1.8 cm adjacent to the RV and diastole with 10% respiratory variation.  Not felt to be consistent with tamponade physiology.   Patient was seen today  in the presence of his wife and daughter.  He has a persistent cough which sounds productive however he is unable to reliably bring up sputum.  He is most bothered by this cough and feels he has contacted his physician team about this however he is unable to find a  solution.  Also has issues with hyponatremia.  He is normotensive to hypertensive on review of vital signs and heart rate is in the 80s and 90s.    Past Medical History:  Diagnosis Date  . Atrial fibrillation, chronic ([place])   . Fall   . Hyponatremia   . Pericardial effusion   . Stage 4 lung cancer, unspecified laterality ([place])     History reviewed. No pertinent surgical history.   Prior to Admission medications   Medication Sig Start Date End Date Taking? Authorizing Provider  ALPRAZolam Duanne Moron) 0.25 MG tablet Take 0.25 mg by mouth at bedtime as needed for anxiety.   Yes [provider]  apixaban (ELIQUIS) 5 MG TABS tablet Take 5 mg by mouth 2 (two) times daily.   Yes [provider]  demeclocycline (DECLOMYCIN) 150 MG tablet Take 150 mg by mouth 2 (two) times daily.   Yes [provider]  diltiazem (DILACOR XR) 180 MG 24 hr capsule Take 180 mg by mouth daily.   Yes [provider]  HYDROcodone-acetaminophen (NORCO/VICODIN) 5-325 MG tablet Take 1 tablet by mouth 3 (three) times daily as needed for moderate pain.   Yes [provider]  levothyroxine (SYNTHROID) 50 MCG tablet Take 50 mcg by mouth daily before breakfast.   Yes [provider]  mirtazapine (REMERON) 30 MG tablet Take 30 mg by mouth at bedtime.   Yes [provider]  Nivolumab (OPDIVO IV) Inject into the vein See admin instructions. Inject as directed every month.   Yes [provider]  potassium chloride SA (KLOR-CON) 20 MEQ tablet Take 40 mEq by mouth 2 (two) times daily.    Yes [provider]  tamsulosin (FLOMAX) 0.4 MG CAPS capsule Take 0.4 mg by mouth in the morning and at bedtime.   Yes [provider]    Inpatient Medications: Scheduled Meds: . apixaban  5 mg Oral BID  . Chlorhexidine Gluconate Cloth  6 each Topical Daily  . demeclocycline  300 mg Oral BID  . diltiazem  180 mg Oral Daily  . docusate sodium  100 mg Oral  BID  . levothyroxine  50 mcg Oral Q0600  . lidocaine  1 patch Transdermal Q24H  . mirtazapine  30 mg Oral QHS  . sodium chloride flush  3 mL Intravenous Q12H  . tamsulosin  0.4 mg Oral BID   Continuous Infusions: . sodium chloride 100 mL/hr at 03/13/20 2218   PRN Meds: acetaminophen **OR** acetaminophen, ALPRAZolam, bisacodyl, hydrALAZINE, HYDROcodone-acetaminophen, morphine injection, ondansetron **OR** ondansetron (ZOFRAN) IV, polyethylene glycol, zolpidem  Allergies:    Allergies  Allergen Reactions  . Aspirin Other (See Comments)    Cannot consume as a result of him taking Eliquis (Blood thinner)    Social History:   Social History   Socioeconomic History  . Marital status: Single    Spouse name: Not on file  . Number of children: Not on file  . Years of education: Not on file  . Highest education level: Not on file  Occupational History  . Occupation: retired Dealer  Tobacco Use  . Smoking status: Former Smoker    Packs/day: 1.00    Years: 30.00  Pack years: 30.00  . Smokeless tobacco: Never Used  Substance and Sexual Activity  . Alcohol use: Not Currently    Comment: h/o heavy use  . Drug use: Never  . Sexual activity: Not on file  Other Topics Concern  . Not on file  Social History Narrative  . Not on file   Social Determinants of Health   Financial Resource Strain:   . Difficulty of Paying Living Expenses:   Food Insecurity:   . Worried About Charity fundraiser in the Last Year:   . Arboriculturist in the Last Year:   Transportation Needs:   . Film/video editor (Medical):   [name] Lack of Transportation (Non-Medical):   Physical Activity:   . Days of Exercise per Week:   . Minutes of Exercise per Session:   Stress:   . Feeling of Stress :   Social Connections:   . Frequency of Communication with Friends and Family:   . Frequency of Social Gatherings with Friends and Family:   . Attends Religious Services:   . Active Member of Clubs or  Organizations:   . Attends Archivist Meetings:   [name] Marital Status:   Intimate Partner Violence:   . Fear of Current or Ex-Partner:   . Emotionally Abused:   [name] Physically Abused:   . Sexually Abused:     Family History:   History reviewed. No pertinent family history. Family Status:  No family status information on file.    ROS:  Please see the history of present illness.  All other ROS reviewed and negative.     Physical Exam/Data:   Vitals:   03/13/20 2000 03/13/20 2300 03/14/20 0500 03/14/20 0742  BP: 117/61 (!) 158/65  133/65  Pulse: 88 80  83  Resp: 16 16  16   Temp: 98.1 F (36.7 C) 98.1 F (36.7 C)  98.3 F (36.8 C)  TempSrc: Oral Oral  Oral  SpO2: 97% 96% 96% 94%  Weight:      Height:        Intake/Output Summary (Last 24 hours) at 03/14/2020 1355 Last data filed at 03/14/2020 0600 Gross per 24 hour  Intake 0 ml  Output 2200 ml  Net -2200 ml   Filed Weights   03/13/20 0944  Weight: 61.2 kg   Body mass index is 18.83 kg/m.   GEN:  Incessant coughing, challenging for the patient to suppress. Neck: No JVD Cardiac: iRRR, no murmurs, rubs, or gallops.  Respiratory: Clear to auscultation bilaterally. GI: Soft, nontender, non-distended  MS: No edema; No deformity. Neuro:  Nonfocal  Psych: Normal affect     EKG:  The EKG was personally reviewed and demonstrates:  Indeterminate rhythm, challenging to determine atrial activity. Probable atrial fibrillation. Prior ECG from 03/12/20 not available for review at this time with report of sinus rhythm.   Relevant CV Studies:   TTE 01/02/2020  1. Left ventricular ejection fraction, by visual estimation, is 60 to  65%. The left ventricle has normal function. There is no left ventricular  hypertrophy.  2. Left ventricular diastolic parameters are consistent with Grade I  diastolic dysfunction (impaired relaxation).  3. Global right ventricle has normal systolic function.The right  ventricular size  is normal. No increase in right ventricular wall  thickness.  4. Left atrial size was normal.  5. Right atrial size was normal.  6. Large pericardial effusion.  7. The pericardial effusion is anterior to the right ventricle.  8. There  is inversion of the right ventricular wall and excessive  respiratory variation in the tricuspid valve spectral Doppler velocities.  9. The mitral valve is normal in structure. Mild mitral valve  regurgitation. No evidence of mitral stenosis.  10. The tricuspid valve is normal in structure.  11. The tricuspid valve is normal in structure. Tricuspid valve  regurgitation is mild.  12. The aortic valve is normal in structure. Aortic valve regurgitation is  not visualized. No evidence of aortic valve sclerosis or stenosis.  13. The pulmonic valve was normal in structure. Pulmonic valve  regurgitation is not visualized.  14. Mildly elevated pulmonary artery systolic pressure.  15. The inferior vena cava is normal in size with greater than 50%  respiratory variability, suggesting right atrial pressure of 3 mmHg.   Echocardiogram 03/13/2020:  1. Moderate to large pericardial effusion. The pericardial effusion is  circumferential. Slightly larger compared to the 01/08/20 study. Effusion  measures 1.6-1.8 cm adjacent to the RV in diastole depending on where  measured. There is 10% respiratory  variation across the MV, not significant. Partial RA diastolic indentation  without full collapse. No RV collapse. IVC is not visualized. Overall  findings not consistent with tamponade physiology.  2. Of note appears patient has two medical record numbers in regards to  finding prior data and studies.   FINDINGS  Pericardium: Effusion measures 1.6-1.8 cm adjacent to the RV in diastole  depending on where measured. There is 10% respiratory variation across the  MV, not significant. Partial RA diastolic collapse. No RV collapse. IVC is  not visualized. Moderate  to  large pericardial effusion. The pericardial effusion is circumferential.   Laboratory Data:  Chemistry Recent Labs  Lab 03/13/20 1155 03/14/20 0258  NA 124* 125*  K 4.6 4.4  CL 92* 94*  CO2 26 23  GLUCOSE 103* 98  BUN 12 8  CREATININE 0.60* 0.59*  CALCIUM 8.7* 8.4*  GFRNONAA >60 >60  GFRAA >60 >60  ANIONGAP 6 8    Total Protein  Date Value Ref Range Status  03/13/2020 5.4 (L) 6.5 - 8.1 g/dL Final   Albumin  Date Value Ref Range Status  03/13/2020 2.9 (L) 3.5 - 5.0 g/dL Final   AST  Date Value Ref Range Status  03/13/2020 30 15 - 41 U/L Final   ALT  Date Value Ref Range Status  03/13/2020 19 0 - 44 U/L Final   Alkaline Phosphatase  Date Value Ref Range Status  03/13/2020 124 38 - 126 U/L Final   Total Bilirubin  Date Value Ref Range Status  03/13/2020 0.7 0.3 - 1.2 mg/dL Final   Hematology Recent Labs  Lab 03/13/20 1155 03/14/20 0258  WBC 9.3 7.8  RBC 4.11* 4.08*  HGB 11.8* 11.8*  HCT 35.6* 34.9*  MCV 86.6 85.5  MCH 28.7 28.9  MCHC 33.1 33.8  RDW 14.7 14.8  PLT 395 386   Cardiac EnzymesNo results for input(s): TROPONINI in the last 168 hours. No results for input(s): TROPIPOC in the last 168 hours.  BNPNo results for input(s): BNP, PROBNP in the last 168 hours.  DDimer No results for input(s): DDIMER in the last 168 hours. TSH: No results found for: TSH Lipids:No results found for: CHOL, HDL, LDLCALC, LDLDIRECT, TRIG, CHOLHDL HgbA1c:No results found for: HGBA1C  Radiology/Studies:  DG Chest 2 View  Result Date: 03/13/2020 CLINICAL DATA:  Ground level fall. Prior diagnosis of pneumonia 3-4 weeks ago with persistent cough. EXAM: CHEST - 2 VIEW COMPARISON:  02/22/2020 and  02/24/2020 chest radiograph. FINDINGS: Emphysema. Decreased density of left basilar/retrocardiac consolidative opacity. Mid to lower lung right consolidative opacities are less conspicuous. No pneumothorax. Trace left pleural effusion, less conspicuous than prior exam. Partially  obscured cardiomediastinal silhouette. Aortic arch atherosclerotic calcifications. Sequela of left shoulder arthroplasty. Chronic multilevel right rib fracture deformities. Multilevel spondylosis. IMPRESSION: Decreased density of left basilar/retrocardiac consolidation. Trace left pleural effusion. Right mid to lower lung opacities are less conspicuous. Electronically Signed   By: Primitivo Gauze M.D.   On: 03/13/2020 10:41   CT Head Wo Contrast  Result Date: 03/13/2020 CLINICAL DATA:  [place]. Facial trauma. EXAM: CT HEAD WITHOUT CONTRAST CT MAXILLOFACIAL WITHOUT CONTRAST CT CERVICAL SPINE WITHOUT CONTRAST TECHNIQUE: Multidetector CT imaging of the head, cervical spine, and maxillofacial structures were performed using the standard protocol without intravenous contrast. Multiplanar CT image reconstructions of the cervical spine and maxillofacial structures were also generated. COMPARISON:  MRI brain 06/30/2019 and head CT from 2015. FINDINGS: CT HEAD FINDINGS Brain: Stable age related cerebral atrophy, ventriculomegaly and periventricular white matter disease. Scattered basal ganglia calcifications. No extra-axial fluid collections are identified. No CT findings for acute hemispheric infarction or intracranial hemorrhage. No mass lesions. The brainstem and cerebellum are normal. Vascular: Vascular calcifications but no aneurysm or hyperdense vessels. Skull: No skull fracture or bone lesions. Other: No scalp lesions or hematoma. CT MAXILLOFACIAL FINDINGS Osseous: Exam limited by patient motion but no definite facial bone fractures are identified. The mandibular condyles are normally located. No obvious mandible fracture. Orbits: The bony orbits are intact. Of the globes are intact. Sinuses: Patchy ethmoid sinus disease and partial opacification of the right half of the sphenoid sinus. The right maxillary sinus contains a mucous retention cyst or polyp. Soft tissues: No significant soft tissue findings. CT  CERVICAL SPINE FINDINGS Alignment: Normal overall alignment. There is severe degenerative cervical spondylosis with multilevel disc disease and facet disease and mild multilevel degenerative subluxations. Skull base and vertebrae: Exam limited by motion artifact but no definite acute fractures. Soft tissues and spinal canal: No prevertebral fluid or swelling. No visible canal hematoma. Disc levels: The spinal canal is patent. Mild multilevel spinal and foraminal stenosis but no large disc protrusions. Upper chest: Emphysematous changes are noted at the lung apices with dense scarring changes. Other: Carotid artery calcifications are noted. IMPRESSION: 1. Stable age related cerebral atrophy, ventriculomegaly and periventricular white matter disease. 2. No acute intracranial findings or skull fracture. 3. No definite acute facial bone fractures. 4. Severe degenerative cervical spondylosis with multilevel disc disease and facet disease but no definite acute cervical spine fracture. Electronically Signed   By: Marijo Sanes M.D.   On: 03/13/2020 10:09   CT CHEST W CONTRAST  Addendum Date: 03/13/2020   ADDENDUM REPORT: 03/13/2020 19:50 ADDENDUM: Signs of interval left hip arthroplasty. Fluid about the arthroplasty may be postoperative given recent placement. Correlate with any clinical signs of infection. Electronically Signed   By: Zetta Bills M.D.   On: 03/13/2020 19:50   Result Date: 03/13/2020 CLINICAL DATA:  Small cell lung cancer assess treatment response. Also experience ground level fall, on Eliquis. EXAM: CT CHEST, ABDOMEN, AND PELVIS WITH CONTRAST TECHNIQUE: Multidetector CT imaging of the chest, abdomen and pelvis was performed following the standard protocol during bolus administration of intravenous contrast. CONTRAST:  126mL OMNIPAQUE IOHEXOL 300 MG/ML  SOLN COMPARISON:  01/23/2020 FINDINGS: CT CHEST FINDINGS Cardiovascular: Calcified and noncalcified atherosclerotic plaque throughout the aorta its  branches in the chest. No aneurysmal dilation of thoracic aorta.  Are stable in terms of mild dilation. Enlarging pericardial effusion, 2 cm thickness along the mid cardiac contour previously approximately 1 cm. Mediastinum/Nodes: Limited assessment of thoracic inlet structures due to motion artifact. No axillary lymphadenopathy. No hilar lymphadenopathy. No mediastinal lymphadenopathy. Esophagus grossly normal. Lungs/Pleura: Bronchial wall thickening at the left hilum with signs of lower lobe bronchiectasis, this is developed since the previous study. Small left-sided effusion is enlarged. Small right effusion with basilar volume loss. Multifocal areas of ground-glass nodularity within aerated portions of the chest and areas of part solid nodules, similar to the prior study. (Image 47, series 4) 2.8 x 1.8 cm, previously 2.9 x 1.6 cm. Solid component appears similar. Ground-glass with increased soft tissue density with subpleural bandlike opacity that has developed amidst ground-glass changes in the right upper lobe (image 57, series 4) 1.7 by 0.7 cm, no solid-appearing abnormality was present in this location on the previous exam. Pure ground-glass lesion in the left upper lobe shows no solid component on today's study measuring approximately 2.4 cm in greatest axial dimension appearing more conspicuous but unchanged in terms of size. Areas of volume loss and opacity in the right lower lobe show increasing density along the superior aspect in the superior segment of the right lower lobe (image 78, series 4) 2.5 x 1.6 cm, this area previously measured approximately 2 point 2 by 1.2 cm. Background pulmonary emphysema. Musculoskeletal: No chest wall mass. See below for full musculoskeletal details. CT ABDOMEN PELVIS FINDINGS Hepatobiliary: Small hyperdense focus in the dome of the right hemi liver approximately cm unchanged likely a small hemangioma. Low-density focus along the margin of the inferior right hemi liver  anteriorly (image 63, series 7) 8 mm, unchanged as is another area of low-density in hepatic subsegment VI measuring 15 mm. Pancreas: Pancreas is normal without focal lesion or ductal dilation. Spleen: Spleen is normal size without focal lesion. Adrenals/Urinary Tract: Adrenal glands with mild thickening of the right adrenal gland unchanged. Left adrenal gland is normal. Renal enhancement is symmetric with some scarring along the lateral cortex of the right kidney. Small low-density foci in the left kidney are likely small cysts and unchanged. No hydronephrosis. Stomach/Bowel: No acute small bowel process. 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 Stool fills much of the colon. The appendix is normal. Vascular/Lymphatic: Calcified and noncalcified plaque of the abdominal aorta without aneurysm. No abdominal adenopathy. No pelvic adenopathy. Reproductive: Prostate is small. Foley catheter in the urinary bladder. Other: Left lower quadrant colostomy. No free air. No ascites. Musculoskeletal: Signs of previous trauma to the right chest but with new right-sided rib fractures involving the right fourth, sixth, seventh and eighth ribs posteriorly. Acute fracture also of anterior right sixth rib. Sternum is intact. Costochondral elements are intact. Marked glenohumeral degenerative changes on the right with post operative changes of left shoulder arthroplasty as before. Signs of prior rib fracture on the left as well with similar appearance to the previous study. Multilevel spinal degenerative change. IMPRESSION: 1. Increased pericardial effusion. Echocardiography may be helpful given moderate to marked increase in size since previous examination. 2. Signs of previous trauma to the right chest but with new right-sided rib fractures involving the right fourth, sixth, seventh and eighth ribs posteriorly. 3. Increasing left effusion with bronchial wall thickening in the left lung base.  Findings could be related to aspiration pneumonitis and are associated with airspace disease on today's study. 4. Increasing nodularity in the right chest. Correlate with any ongoing radiotherapy.  Pneumonitis or worsening of disease is also considered. Short interval follow-up may be helpful as findings are of uncertain significance in the setting of trauma with right-sided rib fractures. 5. Areas also in the right lower lobe that warrant attention on follow-up, potentially related to posttraumatic changes or inflammation though disease worsening is also considered. 6. Small left-sided effusion is enlarged. Electronically Signed: By: Zetta Bills M.D. On: 03/13/2020 17:20   CT Cervical Spine Wo Contrast  Result Date: 03/13/2020 CLINICAL DATA:  [place]. Facial trauma. EXAM: CT HEAD WITHOUT CONTRAST CT MAXILLOFACIAL WITHOUT CONTRAST CT CERVICAL SPINE WITHOUT CONTRAST TECHNIQUE: Multidetector CT imaging of the head, cervical spine, and maxillofacial structures were performed using the standard protocol without intravenous contrast. Multiplanar CT image reconstructions of the cervical spine and maxillofacial structures were also generated. COMPARISON:  MRI brain 06/30/2019 and head CT from 2015. FINDINGS: CT HEAD FINDINGS Brain: Stable age related cerebral atrophy, ventriculomegaly and periventricular white matter disease. Scattered basal ganglia calcifications. No extra-axial fluid collections are identified. No CT findings for acute hemispheric infarction or intracranial hemorrhage. No mass lesions. The brainstem and cerebellum are normal. Vascular: Vascular calcifications but no aneurysm or hyperdense vessels. Skull: No skull fracture or bone lesions. Other: No scalp lesions or hematoma. CT MAXILLOFACIAL FINDINGS Osseous: Exam limited by patient motion but no definite facial bone fractures are identified. The mandibular condyles are normally located. No obvious mandible fracture. Orbits: The bony orbits are intact.  Of the globes are intact. Sinuses: Patchy ethmoid sinus disease and partial opacification of the right half of the sphenoid sinus. The right maxillary sinus contains a mucous retention cyst or polyp. Soft tissues: No significant soft tissue findings. CT CERVICAL SPINE FINDINGS Alignment: Normal overall alignment. There is severe degenerative cervical spondylosis with multilevel disc disease and facet disease and mild multilevel degenerative subluxations. Skull base and vertebrae: Exam limited by motion artifact but no definite acute fractures. Soft tissues and spinal canal: No prevertebral fluid or swelling. No visible canal hematoma. Disc levels: The spinal canal is patent. Mild multilevel spinal and foraminal stenosis but no large disc protrusions. Upper chest: Emphysematous changes are noted at the lung apices with dense scarring changes. Other: Carotid artery calcifications are noted. IMPRESSION: 1. Stable age related cerebral atrophy, ventriculomegaly and periventricular white matter disease. 2. No acute intracranial findings or skull fracture. 3. No definite acute facial bone fractures. 4. Severe degenerative cervical spondylosis with multilevel disc disease and facet disease but no definite acute cervical spine fracture. Electronically Signed   By: Marijo Sanes M.D.   On: 03/13/2020 10:09   CT ABDOMEN PELVIS W CONTRAST  Addendum Date: 03/13/2020   ADDENDUM REPORT: 03/13/2020 19:50 ADDENDUM: Signs of interval left hip arthroplasty. Fluid about the arthroplasty may be postoperative given recent placement. Correlate with any clinical signs of infection. Electronically Signed   By: Zetta Bills M.D.   On: 03/13/2020 19:50   Result Date: 03/13/2020 CLINICAL DATA:  Small cell lung cancer assess treatment response. Also experience ground level fall, on Eliquis. EXAM: CT CHEST, ABDOMEN, AND PELVIS WITH CONTRAST TECHNIQUE: Multidetector CT imaging of the chest, abdomen and pelvis was performed following the  standard protocol during bolus administration of intravenous contrast. CONTRAST:  135mL OMNIPAQUE IOHEXOL 300 MG/ML  SOLN COMPARISON:  01/23/2020 FINDINGS: CT CHEST FINDINGS Cardiovascular: Calcified and noncalcified atherosclerotic plaque throughout the aorta its branches in the chest. No aneurysmal dilation of thoracic aorta. Are stable in terms of mild dilation. Enlarging pericardial effusion, 2 cm thickness along the mid  cardiac contour previously approximately 1 cm. Mediastinum/Nodes: Limited assessment of thoracic inlet structures due to motion artifact. No axillary lymphadenopathy. No hilar lymphadenopathy. No mediastinal lymphadenopathy. Esophagus grossly normal. Lungs/Pleura: Bronchial wall thickening at the left hilum with signs of lower lobe bronchiectasis, this is developed since the previous study. Small left-sided effusion is enlarged. Small right effusion with basilar volume loss. Multifocal areas of ground-glass nodularity within aerated portions of the chest and areas of part solid nodules, similar to the prior study. (Image 47, series 4) 2.8 x 1.8 cm, previously 2.9 x 1.6 cm. Solid component appears similar. Ground-glass with increased soft tissue density with subpleural bandlike opacity that has developed amidst ground-glass changes in the right upper lobe (image 57, series 4) 1.7 by 0.7 cm, no solid-appearing abnormality was present in this location on the previous exam. Pure ground-glass lesion in the left upper lobe shows no solid component on today's study measuring approximately 2.4 cm in greatest axial dimension appearing more conspicuous but unchanged in terms of size. Areas of volume loss and opacity in the right lower lobe show increasing density along the superior aspect in the superior segment of the right lower lobe (image 78, series 4) 2.5 x 1.6 cm, this area previously measured approximately 2 point 2 by 1.2 cm. Background pulmonary emphysema. Musculoskeletal: No chest wall mass. See  below for full musculoskeletal details. CT ABDOMEN PELVIS FINDINGS Hepatobiliary: Small hyperdense focus in the dome of the right hemi liver approximately cm unchanged likely a small hemangioma. Low-density focus along the margin of the inferior right hemi liver anteriorly (image 63, series 7) 8 mm, unchanged as is another area of low-density in hepatic subsegment VI measuring 15 mm. Pancreas: Pancreas is normal without focal lesion or ductal dilation. Spleen: Spleen is normal size without focal lesion. Adrenals/Urinary Tract: Adrenal glands with mild thickening of the right adrenal gland unchanged. Left adrenal gland is normal. Renal enhancement is symmetric with some scarring along the lateral cortex of the right kidney. Small low-density foci in the left kidney are likely small cysts and unchanged. No hydronephrosis. Stomach/Bowel: No acute small bowel process. 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 Stool fills much of the colon. The appendix is normal. Vascular/Lymphatic: Calcified and noncalcified plaque of the abdominal aorta without aneurysm. No abdominal adenopathy. No pelvic adenopathy. Reproductive: Prostate is small. Foley catheter in the urinary bladder. Other: Left lower quadrant colostomy. No free air. No ascites. Musculoskeletal: Signs of previous trauma to the right chest but with new right-sided rib fractures involving the right fourth, sixth, seventh and eighth ribs posteriorly. Acute fracture also of anterior right sixth rib. Sternum is intact. Costochondral elements are intact. Marked glenohumeral degenerative changes on the right with post operative changes of left shoulder arthroplasty as before. Signs of prior rib fracture on the left as well with similar appearance to the previous study. Multilevel spinal degenerative change. IMPRESSION: 1. Increased pericardial effusion. Echocardiography may be helpful given moderate to marked increase in size since  previous examination. 2. Signs of previous trauma to the right chest but with new right-sided rib fractures involving the right fourth, sixth, seventh and eighth ribs posteriorly. 3. Increasing left effusion with bronchial wall thickening in the left lung base. Findings could be related to aspiration pneumonitis and are associated with airspace disease on today's study. 4. Increasing nodularity in the right chest. Correlate with any ongoing radiotherapy. Pneumonitis or worsening of disease is also considered. Short interval follow-up may be helpful as findings  are of uncertain significance in the setting of trauma with right-sided rib fractures. 5. Areas also in the right lower lobe that warrant attention on follow-up, potentially related to posttraumatic changes or inflammation though disease worsening is also considered. 6. Small left-sided effusion is enlarged. Electronically Signed: By: Zetta Bills M.D. On: 03/13/2020 17:20   DG Hand 2 View Left  Result Date: 03/13/2020 CLINICAL DATA:  [place]. Thumb pain. EXAM: LEFT HAND - 2 VIEW COMPARISON:  None. 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 This is likely to be painful. Ordinary degenerative arthritis at the MCP joints of the thumb, index and long fingers. Mild inter phalangeal joint osteoarthritis. IMPRESSION: 1. 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 2. Ordinary degenerative arthritis of the MCP joints of the thumb, index and long fingers. Electronically Signed   By: Nelson Chimes M.D.   On: 03/13/2020 11:42   DG HIP UNILAT WITH PELVIS 2-3 VIEWS LEFT  Result Date: 03/13/2020 CLINICAL DATA:  Pain following fall.  History of lung carcinoma EXAM: DG HIP (WITH OR WITHOUT PELVIS) 2-3V LEFT COMPARISON:  None. FINDINGS: Frontal pelvis as well as frontal and lateral left hip images were obtained. Bones are osteoporotic. There is a total hip replacement on the  left with prosthetic components well-seated. No acute fracture or dislocation. There is degenerative change in the lower lumbar spine. There is mild narrowing of the right hip joint. 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 No fracture or dislocation. 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 Electronically Signed   By: Lowella Grip III M.D.   On: 03/13/2020 10:35   ECHOCARDIOGRAM LIMITED  Result Date: 03/13/2020    ECHOCARDIOGRAM LIMITED REPORT   Patient Name:   Anthony Curry Date of Exam: 03/13/2020 Medical Rec #:  [id number]     Height:       71.0 in Accession #:    [id number]    Weight:       135.0 lb Date of Birth:  16-Aug-1941    BSA:          1.784 m Patient Age:    61 years      BP:           162/68 mmHg Patient Gender: M             HR:           97 bpm. Exam Location:  Inpatient Procedure: Limited Echo, Limited Color Doppler and Cardiac Doppler STAT ECHO Indications:    pericardial effusion  History:        Patient has no prior history of Echocardiogram examinations.                 Lung cancer; Arrythmias:Atrial Fibrillation.  Sonographer:    Johny Chess Referring Phys: [place]  1. Moderate to large pericardial effusion. The pericardial effusion is circumferential. Slightly larger compared to the 01/08/20 study. Effusion measures 1.6-1.8 cm adjacent to the RV in diastole depending on where measured. There is 10% respiratory variation across the MV, not significant. Partial RA diastolic indentation without full collapse. No RV collapse. IVC is not visualized. Overall findings not consistent with tamponade physiology.  2. Of note appears patient has two medical record numbers in regards to finding prior data and studies. FINDINGS  Pericardium: Effusion measures 1.6-1.8 cm adjacent to the RV in diastole  depending on where measured. There is 10%  respiratory variation across the MV, not significant. Partial RA diastolic collapse. No RV collapse. IVC is not visualized. Moderate to large pericardial effusion. The pericardial effusion is circumferential. Carlyle Dolly MD Electronically signed by Carlyle Dolly MD Signature Date/Time: 03/13/2020/8:58:41 PM    Final    CT Maxillofacial WO CM  Result Date: 03/13/2020 CLINICAL DATA:  [place]. Facial trauma. EXAM: CT HEAD WITHOUT CONTRAST CT MAXILLOFACIAL WITHOUT CONTRAST CT CERVICAL SPINE WITHOUT CONTRAST TECHNIQUE: Multidetector CT imaging of the head, cervical spine, and maxillofacial structures were performed using the standard protocol without intravenous contrast. Multiplanar CT image reconstructions of the cervical spine and maxillofacial structures were also generated. COMPARISON:  MRI brain 06/30/2019 and head CT from 2015. FINDINGS: CT HEAD FINDINGS Brain: Stable age related cerebral atrophy, ventriculomegaly and periventricular white matter disease. Scattered basal ganglia calcifications. No extra-axial fluid collections are identified. No CT findings for acute hemispheric infarction or intracranial hemorrhage. No mass lesions. The brainstem and cerebellum are normal. Vascular: Vascular calcifications but no aneurysm or hyperdense vessels. Skull: No skull fracture or bone lesions. Other: No scalp lesions or hematoma. CT MAXILLOFACIAL FINDINGS Osseous: Exam limited by patient motion but no definite facial bone fractures are identified. The mandibular condyles are normally located. No obvious mandible fracture. Orbits: The bony orbits are intact. Of the globes are intact. Sinuses: Patchy ethmoid sinus disease and partial opacification of the right half of the sphenoid sinus. The right maxillary sinus contains a mucous retention cyst or polyp. Soft tissues: No significant soft tissue findings. CT CERVICAL SPINE FINDINGS Alignment: Normal overall alignment. There is severe degenerative cervical spondylosis  with multilevel disc disease and facet disease and mild multilevel degenerative subluxations. Skull base and vertebrae: Exam limited by motion artifact but no definite acute fractures. Soft tissues and spinal canal: No prevertebral fluid or swelling. No visible canal hematoma. Disc levels: The spinal canal is patent. Mild multilevel spinal and foraminal stenosis but no large disc protrusions. Upper chest: Emphysematous changes are noted at the lung apices with dense scarring changes. Other: Carotid artery calcifications are noted. IMPRESSION: 1. Stable age related cerebral atrophy, ventriculomegaly and periventricular white matter disease. 2. No acute intracranial findings or skull fracture. 3. No definite acute facial bone fractures. 4. Severe degenerative cervical spondylosis with multilevel disc disease and facet disease but no definite acute cervical spine fracture. Electronically Signed   By: Marijo Sanes M.D.   On: 03/13/2020 10:09   Assessment and Plan:   1. Pericardial effusion: -Patient presented after sustaining mechanical fall with confusion and no memory of the event while transferring from wheelchair to walker found to had a negative head CT. Echo showed no tamponade physiology. 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  I have offered information on the natural history and progression expected.  In addition I have shared with the family potential interventions including pericardiocentesis and pericardial window with our surgical team.  Without signs or symptoms of tamponade, I have shared that these procedures pose an undue risk without significant benefit, and are generally reserved for definitive management or hemodynamic compromise.  The patient tells me in no uncertain terms, when offered further interventional management of pericardial effusion versus  expectant management, that he would like not to pursue further investigation or intervention for his pericardial effusion at this time.  He tells me "I have too much other stuff  going on".  I have shared with the patient that we will see him as needed when in the hospital, but at this time we will sign off.  We do pleasant discussion and I answered questions the best my ability.   2. Paroxysmal atrial fibrillation: -EKG from 03/13/2020 shows rate controlled atrial fibrillation.  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 -Hemoglobin stable with no signs of acute bleeding in stool or urine -Continue current dose of diltiazem.    Other medical issues managed per primary team include: -Stage IV lung CA -Right posterior rib fractures -Urinary retention requiring indwelling Foley catheter -Complicated diverticulitis -Hypothyroidism -Recent COVID-19 infection  Active Problems:   Hyponatremia   Stage 4 lung cancer, unspecified laterality (HCC)   Pericardial effusion   Atrial fibrillation, chronic ([place])   Fall (on)(from) sidewalk curb, initial encounter   Multiple fractures of rib involving four or more ribs   Diverticulitis   Hypothyroidism (acquired)   Per the patient's primary cardiologist, follow-up can be as needed with cardiology per primary physician and oncologic team.

## 2020-03-14 NOTE — Evaluation (Signed)
Clinical/Bedside Swallow Evaluation Patient Details  Name: Anthony Curry MRN: [id number] Date of Birth: Jan 05, 1941  Today's Date: 03/14/2020 Time: SLP Start Time (ACUTE ONLY): 1638 SLP Stop Time (ACUTE ONLY): 0903 SLP Time Calculation (min) (ACUTE ONLY): 16 min  Past Medical History:  Past Medical History:  Diagnosis Date  . Atrial fibrillation, chronic ([place])   . Fall   . Hyponatremia   . Pericardial effusion   . Stage 4 lung cancer, unspecified laterality [facility])    Past Surgical History: History reviewed. No pertinent surgical history. HPI:  79 y.o. 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   Patient was previously hospitalized from 1/28-01/10/20 for perforated diverticulitis and underwent sigmoid colectomy with colostomy creation,  hospital course complicated by Covid 19 pneumonia, urinary retention requiring indwelling foley, ischemic left hand requiring left brachial embolectomy on 2/1 by vascular surgery ( Dr Oneida Alar), on Eliquis since then. He was discharged to SNF but states was not getting enough help there, felt miserable and couldn't stay there land left home the following day.  He was last hospitalized from 3/15-23 for fall resulting in hip fracture.  He was discharged to SNF. CT chest on 03/13/20 indicated Signs of previous trauma to the right chest but with new right-sided rib fractures involving the right fourth, sixth, seventh and eighth ribs posteriorly. 3. Increasing left effusion with bronchial wall thickening in the left lung base. Findings could be related to aspiration pneumonitis and are associated with airspace disease on today's study. 4. Increasing nodularity in the right chest. Correlate with any ongoing radiotherapy. Pneumonitis or worsening of disease is also considered.  Short interval follow-up may be helpful as findings are of uncertain significance in the setting of trauma with right-sided rib fractures. 5. Areas also in the right lower lobe that warrant attention on follow-up, potentially related to posttraumatic changes or inflammation though disease worsening is also considered.  Assessment / Plan / Recommendation Clinical Impression  Pt seen for skilled assessment of swallowing with one incidence of delayed congested cough after dry, crumbly regular consistency; wife/pt state he has "had this cough for a while" and it was noted prior to and after intake of regular consistency; oral phase of the swallow appeared [facility], but pharyngeal weakness cannot be ruled out as chest CT indicated potential for aspiration PNA and deconditioning noted within last month; recommend ST f/u for meal tolerance and potential objective testing prn; continue regular/thin liquid diet; thank you for this consult. SLP Visit Diagnosis: Dysphagia, pharyngeal phase (R13.13)    Aspiration Risk  Mild aspiration risk    Diet Recommendation   Regular/thin liquids  Medication Administration: Whole meds with liquid    Other  Recommendations Oral Care Recommendations: Oral care BID   Follow up Recommendations Other (comment)(TBD)      Frequency and Duration min 1 x/week  1 week       Prognosis Prognosis for Safe Diet Advancement: Good      Swallow Study   General Date of Onset: 03/13/20 HPI: 79 y.o. 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   Patient was previously hospitalized from 1/28-01/10/20 for perforated diverticulitis and underwent sigmoid colectomy with colostomy creation,  hospital course complicated by Covid 19 pneumonia, urinary retention requiring indwelling foley,  ischemic left hand requiring left brachial embolectomy on 2/1 by vascular surgery ( Dr Oneida Alar), on Eliquis since then. He was discharged to SNF but states was not getting enough help there, felt miserable and couldn't stay there land left home the following day.  He was last hospitalized from 3/15-23 for fall resulting in hip fracture.  He was discharged to SNF. Type of Study: Bedside Swallow Evaluation Previous Swallow Assessment: (n/a) Diet Prior to this Study: Regular;Thin liquids Temperature Spikes Noted: No Respiratory Status: Room air History of Recent Intubation: No Behavior/Cognition: Alert;Cooperative Oral Cavity Assessment: Within Functional Limits Oral Care Completed by SLP: No Oral Cavity - Dentition: Adequate natural dentition;Missing dentition Vision: Functional for self-feeding Self-Feeding Abilities: Able to feed self Patient Positioning: Upright in bed Baseline Vocal Quality: Low vocal intensity Volitional Cough: Strong Volitional Swallow: Able to elicit    Oral/Motor/Sensory Function Overall Oral Motor/Sensory Function: Within functional limits   Ice Chips Ice chips: Not tested   Thin Liquid Thin Liquid: Within functional limits Presentation: Cup    Nectar Thick Nectar Thick Liquid: Not tested   Honey Thick Honey Thick Liquid: Not tested   Puree Puree: Not tested   Solid     Solid: Impaired Presentation: Self Fed Pharyngeal Phase Impairments: Cough - Delayed Other Comments: (Pt stated "I have had this cough a long time")      Elvina Sidle, M.S., CCC-SLP 03/14/2020,9:41 AM

## 2020-03-14 NOTE — Progress Notes (Signed)
PROGRESS NOTE  Anthony Curry  QVZ:[id number] DOB: Mar 20, 1941  DOA: 03/13/2020 PCP: Patient, No Pcp Per   Brief Narrative:  Anthony Curry is a 79 y.o. 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 Patient was hospitalized in January 2021 for perforated diverticulitis status post sigmoid colectomy with colostomy, development of ischemic left hand status post left brachial embolectomy and COVID-19 pneumonia.  He was also hospitalized in March after a fall complicated by hip fracture.  He has been feeling tired with intermittent dizziness of late, and whilst getting ready for a urology appointment he fell to the ground was standing with his walker without loss of consciousness.  ED imaging and twelve-lead EKG were unremarkable for any acute findings, but was noted to have significant hyponatremia from baseline, and he was admitted for symptomatic hyponatremia.  Assessment & Plan:   Active Problems:   Hyponatremia   Stage 4 lung cancer, unspecified laterality (HCC)   Pericardial effusion   Atrial fibrillation, chronic ([place])   Fall (on)(from) sidewalk curb, initial encounter   Multiple fractures of rib involving four or more ribs   Diverticulitis   Hypothyroidism (acquired)   Symptomatic Hyponatremia -Patient with recurrent falls  -He was standing with his walker today and collapsed; he does not remember the event and is uncertain why he fell but his wife reports no LOC -He has baseline sodium 130-132 (admitting sodium 124; this is thought to be contributing to his weakness) -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 -He was due for routine repeat CT C/A/P on 4/22 as outpatient, therefore this was done on admission and noted: Increased pericardial effusion- moderate to marked  increase in size, increasing left pleural effusion with bronchial wall thickening concerning for airspace disease possibly aspiration pneumonitis -2D cardiogram ordered for above -may need antibiotic if any fever or leukocytosis -Continue demeclocycline  Pericardial effusion Worsening pain CT report 2D echo 03/13/2020-moderate to large pericardial effusion No clinical evidence of tamponade Stage 4 lung cancer -Diagnosed in 09/2013 -He is treated with Opdivo immunotherapy -Treatment has been on hold since 11/2019 -Based on weakness, frailty, and cachexia, therapy was held at his last visit in March and he has been scheduled for restaging CT C/A/P -Due to cough, SOB, and weakness with falls, repeat imaging was performed today -Will add Dr. [name] to the treatment team and I sent him a secure chat message today asking if he can see the patient tomorrow to review the scans with him -Once again, there is noted concern for possible aspiration pneumonitis - which could be contributing to his cough; will make NPO and plan for ST swallow evaluation tomorrow  R posterior rib fractures -Patient c/o R-sided back pain just below his scapula with cough -Imaging indicates posterior right-sided rib fractures - which is likely the source of his pain -Will give pain control with Vicodin, Morphine, and add Lidocaine patch  Urinary retention -Has indwelling foley -Failed his last voiding trial and he was scheduled to f/u with urology today -Will leave foley in for now -Continue Flomax  Afib -On Eliquis, will continue -Rate control with Diltiazem   DVT prophylaxis: Eliquis Code Status: DNR Family Communication: Wife Disposition Plan: To be determined   Consultants:  Cardiology, by admitting physician  Procedures:     Antimicrobials:  Subjective: Feels a little better, no fever or chills.  PT and OT consulted.  Speech therapy notes reviewed and noted  Objective:  Vitals:    03/13/20 2000 03/13/20 2300 03/14/20 0500 03/14/20 0742  BP: 117/61 (!) 158/65  133/65  Pulse: 88 80  83  Resp: 16 16  16   Temp: 98.1 F (36.7 C) 98.1 F (36.7 C)  98.3 F (36.8 C)  TempSrc: Oral Oral  Oral  SpO2: 97% 96% 96% 94%  Weight:      Height:        Intake/Output Summary (Last 24 hours) at 03/14/2020 1450 Last data filed at 03/14/2020 0600 Gross per 24 hour  Intake 0 ml  Output 2200 ml  Net -2200 ml   Filed Weights   03/13/20 0944  Weight: 61.2 kg    Examination:  General exam: Comfortable, no acute distress Respiratory system: Coarse breath sounds.  Respiratory effort normal. Cardiovascular system: S1 & S2 heard, RRR. No JVD, murmurs, rubs, gallops or clicks. No pedal edema. Gastrointestinal system: Abdomen is nondistended, soft and nontender. No organomegaly or masses felt. Normal bowel sounds heard. Central nervous system: Alert and oriented. No focal neurological deficits. Extremities: Symmetric 5 x 5 power. Psychiatry: Judgement and insight appear normal. Mood & affect appropriate.     Data Reviewed: I have personally reviewed following labs and imaging studies  CBC: Recent Labs  Lab 03/13/20 1155 03/14/20 0258  WBC 9.3 7.8  NEUTROABS 7.2  --   HGB 11.8* 11.8*  HCT 35.6* 34.9*  MCV 86.6 85.5  PLT 395 017   Basic Metabolic Panel: Recent Labs  Lab 03/13/20 1155 03/14/20 0258  NA 124* 125*  K 4.6 4.4  CL 92* 94*  CO2 26 23  GLUCOSE 103* 98  BUN 12 8  CREATININE 0.60* 0.59*  CALCIUM 8.7* 8.4*   GFR: Estimated Creatinine Clearance: 63.8 mL/min (A) (by C-G formula based on SCr of 0.59 mg/dL (L)). Liver Function Tests: Recent Labs  Lab 03/13/20 1155  AST 30  ALT 19  ALKPHOS 124  BILITOT 0.7  PROT 5.4*  ALBUMIN 2.9*   No results for input(s): LIPASE, AMYLASE in the last 168 hours. No results for input(s): AMMONIA in the last 168 hours. Coagulation Profile: No results for input(s): INR, PROTIME in the last 168 hours. Cardiac  Enzymes: No results for input(s): CKTOTAL, CKMB, CKMBINDEX, TROPONINI in the last 168 hours. BNP (last 3 results) No results for input(s): PROBNP in the last 8760 hours. HbA1C: No results for input(s): HGBA1C in the last 72 hours. CBG: No results for input(s): GLUCAP in the last 168 hours. Lipid Profile: No results for input(s): CHOL, HDL, LDLCALC, TRIG, CHOLHDL, LDLDIRECT in the last 72 hours. Thyroid Function Tests: No results for input(s): TSH, T4TOTAL, FREET4, T3FREE, THYROIDAB in the last 72 hours. Anemia Panel: No results for input(s): VITAMINB12, FOLATE, FERRITIN, TIBC, IRON, RETICCTPCT in the last 72 hours.  Sepsis Labs: Recent Labs  Lab 03/13/20 1155 03/14/20 0258  WBC 9.3 7.8    Recent Results (from the past 240 hour(s))  SARS CORONAVIRUS 2 (TAT 6-24 HRS) Nasopharyngeal Nasopharyngeal Swab     Status: None   Collection Time: 03/13/20  1:51 PM   Specimen: Nasopharyngeal Swab  Result Value Ref Range Status   SARS Coronavirus 2 NEGATIVE NEGATIVE Final    Comment: (NOTE) SARS-CoV-2 target nucleic acids are NOT DETECTED. The SARS-CoV-2 RNA is generally detectable in upper and lower respiratory specimens during the acute phase of infection. Negative results  do not preclude SARS-CoV-2 infection, do not rule out co-infections with other pathogens, and should not be used as the sole basis for treatment or other patient management decisions. Negative results must be combined with clinical observations, patient history, and epidemiological information. The expected result is Negative. Fact Sheet for Patients: SugarRoll.be Fact Sheet for Healthcare Providers: https://www.woods-mathews.com/ This test is not yet approved or cleared by the [place] FDA and  has been authorized for detection and/or diagnosis of SARS-CoV-2 by FDA under an Emergency Use Authorization (EUA). This EUA will remain  in effect (meaning this test can be  used) for the duration of the COVID-19 declaration under Section 56 4(b)(1) of the Act, 21 U.S.C. section 360bbb-3(b)(1), unless the authorization is terminated or revoked sooner. Performed at [facility], [place] [address] [place], [place] [address]          Radiology Studies: DG Chest 2 View  Result Date: 03/13/2020 CLINICAL DATA:  Ground level fall. Prior diagnosis of pneumonia 3-4 weeks ago with persistent cough. EXAM: CHEST - 2 VIEW COMPARISON:  02/22/2020 and 02/24/2020 chest radiograph. FINDINGS: Emphysema. Decreased density of left basilar/retrocardiac consolidative opacity. Mid to lower lung right consolidative opacities are less conspicuous. No pneumothorax. Trace left pleural effusion, less conspicuous than prior exam. Partially obscured cardiomediastinal silhouette. Aortic arch atherosclerotic calcifications. Sequela of left shoulder arthroplasty. Chronic multilevel right rib fracture deformities. Multilevel spondylosis. IMPRESSION: Decreased density of left basilar/retrocardiac consolidation. Trace left pleural effusion. Right mid to lower lung opacities are less conspicuous. Electronically Signed   By: Primitivo Gauze M.D.   On: 03/13/2020 10:41   CT Head Wo Contrast  Result Date: 03/13/2020 CLINICAL DATA:  [place]. Facial trauma. EXAM: CT HEAD WITHOUT CONTRAST CT MAXILLOFACIAL WITHOUT CONTRAST CT CERVICAL SPINE WITHOUT CONTRAST TECHNIQUE: Multidetector CT imaging of the head, cervical spine, and maxillofacial structures were performed using the standard protocol without intravenous contrast. Multiplanar CT image reconstructions of the cervical spine and maxillofacial structures were also generated. COMPARISON:  MRI brain 06/30/2019 and head CT from 2015. FINDINGS: CT HEAD FINDINGS Brain: Stable age related cerebral atrophy, ventriculomegaly and periventricular white matter disease. Scattered basal ganglia calcifications. No extra-axial fluid collections are identified. No CT  findings for acute hemispheric infarction or intracranial hemorrhage. No mass lesions. The brainstem and cerebellum are normal. Vascular: Vascular calcifications but no aneurysm or hyperdense vessels. Skull: No skull fracture or bone lesions. Other: No scalp lesions or hematoma. CT MAXILLOFACIAL FINDINGS Osseous: Exam limited by patient motion but no definite facial bone fractures are identified. The mandibular condyles are normally located. No obvious mandible fracture. Orbits: The bony orbits are intact. Of the globes are intact. Sinuses: Patchy ethmoid sinus disease and partial opacification of the right half of the sphenoid sinus. The right maxillary sinus contains a mucous retention cyst or polyp. Soft tissues: No significant soft tissue findings. CT CERVICAL SPINE FINDINGS Alignment: Normal overall alignment. There is severe degenerative cervical spondylosis with multilevel disc disease and facet disease and mild multilevel degenerative subluxations. Skull base and vertebrae: Exam limited by motion artifact but no definite acute fractures. Soft tissues and spinal canal: No prevertebral fluid or swelling. No visible canal hematoma. Disc levels: The spinal canal is patent. Mild multilevel spinal and foraminal stenosis but no large disc protrusions. Upper chest: Emphysematous changes are noted at the lung apices with dense scarring changes. Other: Carotid artery calcifications are noted. IMPRESSION: 1. Stable age related cerebral atrophy, ventriculomegaly and periventricular white matter disease. 2. No acute intracranial findings  or skull fracture. 3. No definite acute facial bone fractures. 4. Severe degenerative cervical spondylosis with multilevel disc disease and facet disease but no definite acute cervical spine fracture. Electronically Signed   By: Marijo Sanes M.D.   On: 03/13/2020 10:09   CT CHEST W CONTRAST  Addendum Date: 03/13/2020   ADDENDUM REPORT: 03/13/2020 19:50 ADDENDUM: Signs of interval  left hip arthroplasty. Fluid about the arthroplasty may be postoperative given recent placement. Correlate with any clinical signs of infection. Electronically Signed   By: Zetta Bills M.D.   On: 03/13/2020 19:50   Result Date: 03/13/2020 CLINICAL DATA:  Small cell lung cancer assess treatment response. Also experience ground level fall, on Eliquis. EXAM: CT CHEST, ABDOMEN, AND PELVIS WITH CONTRAST TECHNIQUE: Multidetector CT imaging of the chest, abdomen and pelvis was performed following the standard protocol during bolus administration of intravenous contrast. CONTRAST:  127mL OMNIPAQUE IOHEXOL 300 MG/ML  SOLN COMPARISON:  01/23/2020 FINDINGS: CT CHEST FINDINGS Cardiovascular: Calcified and noncalcified atherosclerotic plaque throughout the aorta its branches in the chest. No aneurysmal dilation of thoracic aorta. Are stable in terms of mild dilation. Enlarging pericardial effusion, 2 cm thickness along the mid cardiac contour previously approximately 1 cm. Mediastinum/Nodes: Limited assessment of thoracic inlet structures due to motion artifact. No axillary lymphadenopathy. No hilar lymphadenopathy. No mediastinal lymphadenopathy. Esophagus grossly normal. Lungs/Pleura: Bronchial wall thickening at the left hilum with signs of lower lobe bronchiectasis, this is developed since the previous study. Small left-sided effusion is enlarged. Small right effusion with basilar volume loss. Multifocal areas of ground-glass nodularity within aerated portions of the chest and areas of part solid nodules, similar to the prior study. (Image 47, series 4) 2.8 x 1.8 cm, previously 2.9 x 1.6 cm. Solid component appears similar. Ground-glass with increased soft tissue density with subpleural bandlike opacity that has developed amidst ground-glass changes in the right upper lobe (image 57, series 4) 1.7 by 0.7 cm, no solid-appearing abnormality was present in this location on the previous exam. Pure ground-glass lesion in the  left upper lobe shows no solid component on today's study measuring approximately 2.4 cm in greatest axial dimension appearing more conspicuous but unchanged in terms of size. Areas of volume loss and opacity in the right lower lobe show increasing density along the superior aspect in the superior segment of the right lower lobe (image 78, series 4) 2.5 x 1.6 cm, this area previously measured approximately 2 point 2 by 1.2 cm. Background pulmonary emphysema. Musculoskeletal: No chest wall mass. See below for full musculoskeletal details. CT ABDOMEN PELVIS FINDINGS Hepatobiliary: Small hyperdense focus in the dome of the right hemi liver approximately cm unchanged likely a small hemangioma. Low-density focus along the margin of the inferior right hemi liver anteriorly (image 63, series 7) 8 mm, unchanged as is another area of low-density in hepatic subsegment VI measuring 15 mm. Pancreas: Pancreas is normal without focal lesion or ductal dilation. Spleen: Spleen is normal size without focal lesion. Adrenals/Urinary Tract: Adrenal glands with mild thickening of the right adrenal gland unchanged. Left adrenal gland is normal. Renal enhancement is symmetric with some scarring along the lateral cortex of the right kidney. Small low-density foci in the left kidney are likely small cysts and unchanged. No hydronephrosis. Stomach/Bowel: No acute small bowel process. 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 Stool fills much of the colon. The appendix is normal. Vascular/Lymphatic: Calcified and noncalcified plaque of the abdominal aorta without aneurysm. No abdominal adenopathy. No  pelvic adenopathy. Reproductive: Prostate is small. Foley catheter in the urinary bladder. Other: Left lower quadrant colostomy. No free air. No ascites. Musculoskeletal: Signs of previous trauma to the right chest but with new right-sided rib fractures involving the right fourth, sixth, seventh and eighth ribs  posteriorly. Acute fracture also of anterior right sixth rib. Sternum is intact. Costochondral elements are intact. Marked glenohumeral degenerative changes on the right with post operative changes of left shoulder arthroplasty as before. Signs of prior rib fracture on the left as well with similar appearance to the previous study. Multilevel spinal degenerative change. IMPRESSION: 1. Increased pericardial effusion. Echocardiography may be helpful given moderate to marked increase in size since previous examination. 2. Signs of previous trauma to the right chest but with new right-sided rib fractures involving the right fourth, sixth, seventh and eighth ribs posteriorly. 3. Increasing left effusion with bronchial wall thickening in the left lung base. Findings could be related to aspiration pneumonitis and are associated with airspace disease on today's study. 4. Increasing nodularity in the right chest. Correlate with any ongoing radiotherapy. Pneumonitis or worsening of disease is also considered. Short interval follow-up may be helpful as findings are of uncertain significance in the setting of trauma with right-sided rib fractures. 5. Areas also in the right lower lobe that warrant attention on follow-up, potentially related to posttraumatic changes or inflammation though disease worsening is also considered. 6. Small left-sided effusion is enlarged. Electronically Signed: By: Zetta Bills M.D. On: 03/13/2020 17:20   CT Cervical Spine Wo Contrast  Result Date: 03/13/2020 CLINICAL DATA:  [place]. Facial trauma. EXAM: CT HEAD WITHOUT CONTRAST CT MAXILLOFACIAL WITHOUT CONTRAST CT CERVICAL SPINE WITHOUT CONTRAST TECHNIQUE: Multidetector CT imaging of the head, cervical spine, and maxillofacial structures were performed using the standard protocol without intravenous contrast. Multiplanar CT image reconstructions of the cervical spine and maxillofacial structures were also generated. COMPARISON:  MRI brain  06/30/2019 and head CT from 2015. FINDINGS: CT HEAD FINDINGS Brain: Stable age related cerebral atrophy, ventriculomegaly and periventricular white matter disease. Scattered basal ganglia calcifications. No extra-axial fluid collections are identified. No CT findings for acute hemispheric infarction or intracranial hemorrhage. No mass lesions. The brainstem and cerebellum are normal. Vascular: Vascular calcifications but no aneurysm or hyperdense vessels. Skull: No skull fracture or bone lesions. Other: No scalp lesions or hematoma. CT MAXILLOFACIAL FINDINGS Osseous: Exam limited by patient motion but no definite facial bone fractures are identified. The mandibular condyles are normally located. No obvious mandible fracture. Orbits: The bony orbits are intact. Of the globes are intact. Sinuses: Patchy ethmoid sinus disease and partial opacification of the right half of the sphenoid sinus. The right maxillary sinus contains a mucous retention cyst or polyp. Soft tissues: No significant soft tissue findings. CT CERVICAL SPINE FINDINGS Alignment: Normal overall alignment. There is severe degenerative cervical spondylosis with multilevel disc disease and facet disease and mild multilevel degenerative subluxations. Skull base and vertebrae: Exam limited by motion artifact but no definite acute fractures. Soft tissues and spinal canal: No prevertebral fluid or swelling. No visible canal hematoma. Disc levels: The spinal canal is patent. Mild multilevel spinal and foraminal stenosis but no large disc protrusions. Upper chest: Emphysematous changes are noted at the lung apices with dense scarring changes. Other: Carotid artery calcifications are noted. IMPRESSION: 1. Stable age related cerebral atrophy, ventriculomegaly and periventricular white matter disease. 2. No acute intracranial findings or skull fracture. 3. No definite acute facial bone fractures. 4. Severe degenerative cervical spondylosis with multilevel  disc  disease and facet disease but no definite acute cervical spine fracture. Electronically Signed   By: Marijo Sanes M.D.   On: 03/13/2020 10:09   CT ABDOMEN PELVIS W CONTRAST  Addendum Date: 03/13/2020   ADDENDUM REPORT: 03/13/2020 19:50 ADDENDUM: Signs of interval left hip arthroplasty. Fluid about the arthroplasty may be postoperative given recent placement. Correlate with any clinical signs of infection. Electronically Signed   By: Zetta Bills M.D.   On: 03/13/2020 19:50   Result Date: 03/13/2020 CLINICAL DATA:  Small cell lung cancer assess treatment response. Also experience ground level fall, on Eliquis. EXAM: CT CHEST, ABDOMEN, AND PELVIS WITH CONTRAST TECHNIQUE: Multidetector CT imaging of the chest, abdomen and pelvis was performed following the standard protocol during bolus administration of intravenous contrast. CONTRAST:  156mL OMNIPAQUE IOHEXOL 300 MG/ML  SOLN COMPARISON:  01/23/2020 FINDINGS: CT CHEST FINDINGS Cardiovascular: Calcified and noncalcified atherosclerotic plaque throughout the aorta its branches in the chest. No aneurysmal dilation of thoracic aorta. Are stable in terms of mild dilation. Enlarging pericardial effusion, 2 cm thickness along the mid cardiac contour previously approximately 1 cm. Mediastinum/Nodes: Limited assessment of thoracic inlet structures due to motion artifact. No axillary lymphadenopathy. No hilar lymphadenopathy. No mediastinal lymphadenopathy. Esophagus grossly normal. Lungs/Pleura: Bronchial wall thickening at the left hilum with signs of lower lobe bronchiectasis, this is developed since the previous study. Small left-sided effusion is enlarged. Small right effusion with basilar volume loss. Multifocal areas of ground-glass nodularity within aerated portions of the chest and areas of part solid nodules, similar to the prior study. (Image 47, series 4) 2.8 x 1.8 cm, previously 2.9 x 1.6 cm. Solid component appears similar. Ground-glass with increased soft  tissue density with subpleural bandlike opacity that has developed amidst ground-glass changes in the right upper lobe (image 57, series 4) 1.7 by 0.7 cm, no solid-appearing abnormality was present in this location on the previous exam. Pure ground-glass lesion in the left upper lobe shows no solid component on today's study measuring approximately 2.4 cm in greatest axial dimension appearing more conspicuous but unchanged in terms of size. Areas of volume loss and opacity in the right lower lobe show increasing density along the superior aspect in the superior segment of the right lower lobe (image 78, series 4) 2.5 x 1.6 cm, this area previously measured approximately 2 point 2 by 1.2 cm. Background pulmonary emphysema. Musculoskeletal: No chest wall mass. See below for full musculoskeletal details. CT ABDOMEN PELVIS FINDINGS Hepatobiliary: Small hyperdense focus in the dome of the right hemi liver approximately cm unchanged likely a small hemangioma. Low-density focus along the margin of the inferior right hemi liver anteriorly (image 63, series 7) 8 mm, unchanged as is another area of low-density in hepatic subsegment VI measuring 15 mm. Pancreas: Pancreas is normal without focal lesion or ductal dilation. Spleen: Spleen is normal size without focal lesion. Adrenals/Urinary Tract: Adrenal glands with mild thickening of the right adrenal gland unchanged. Left adrenal gland is normal. Renal enhancement is symmetric with some scarring along the lateral cortex of the right kidney. Small low-density foci in the left kidney are likely small cysts and unchanged. No hydronephrosis. Stomach/Bowel: No acute small bowel process. 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 Stool fills much of the colon. The appendix is normal. Vascular/Lymphatic: Calcified and noncalcified plaque of the abdominal aorta without aneurysm. No abdominal adenopathy. No pelvic adenopathy. Reproductive: Prostate is  small. Foley catheter in the urinary bladder. Other: Left lower  quadrant colostomy. No free air. No ascites. Musculoskeletal: Signs of previous trauma to the right chest but with new right-sided rib fractures involving the right fourth, sixth, seventh and eighth ribs posteriorly. Acute fracture also of anterior right sixth rib. Sternum is intact. Costochondral elements are intact. Marked glenohumeral degenerative changes on the right with post operative changes of left shoulder arthroplasty as before. Signs of prior rib fracture on the left as well with similar appearance to the previous study. Multilevel spinal degenerative change. IMPRESSION: 1. Increased pericardial effusion. Echocardiography may be helpful given moderate to marked increase in size since previous examination. 2. Signs of previous trauma to the right chest but with new right-sided rib fractures involving the right fourth, sixth, seventh and eighth ribs posteriorly. 3. Increasing left effusion with bronchial wall thickening in the left lung base. Findings could be related to aspiration pneumonitis and are associated with airspace disease on today's study. 4. Increasing nodularity in the right chest. Correlate with any ongoing radiotherapy. Pneumonitis or worsening of disease is also considered. Short interval follow-up may be helpful as findings are of uncertain significance in the setting of trauma with right-sided rib fractures. 5. Areas also in the right lower lobe that warrant attention on follow-up, potentially related to posttraumatic changes or inflammation though disease worsening is also considered. 6. Small left-sided effusion is enlarged. Electronically Signed: By: Zetta Bills M.D. On: 03/13/2020 17:20   DG Hand 2 View Left  Result Date: 03/13/2020 CLINICAL DATA:  [place]. Thumb pain. EXAM: LEFT HAND - 2 VIEW COMPARISON:  None. 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 This is  likely to be painful. Ordinary degenerative arthritis at the MCP joints of the thumb, index and long fingers. Mild inter phalangeal joint osteoarthritis. IMPRESSION: 1. 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 2. Ordinary degenerative arthritis of the MCP joints of the thumb, index and long fingers. Electronically Signed   By: Nelson Chimes M.D.   On: 03/13/2020 11:42   DG HIP UNILAT WITH PELVIS 2-3 VIEWS LEFT  Result Date: 03/13/2020 CLINICAL DATA:  Pain following fall.  History of lung carcinoma EXAM: DG HIP (WITH OR WITHOUT PELVIS) 2-3V LEFT COMPARISON:  None. FINDINGS: Frontal pelvis as well as frontal and lateral left hip images were obtained. Bones are osteoporotic. 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 No acute fracture or dislocation. There is degenerative change in the lower lumbar spine. There is mild narrowing of the right hip joint. 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 No fracture or dislocation. 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 Electronically Signed   By: Lowella Grip III M.D.   On: 03/13/2020 10:35   ECHOCARDIOGRAM LIMITED  Result Date: 03/13/2020    ECHOCARDIOGRAM LIMITED REPORT   Patient Name:   Anthony Curry Date of Exam: 03/13/2020 Medical Rec #:  [id number]     Height:       71.0 in Accession #:    [id number]    Weight:       135.0 lb Date of Birth:  Jun 14, 1941    BSA:          1.784 m Patient Age:    71 years      BP:           162/68 mmHg Patient Gender: M  HR:           97 bpm. Exam Location:  Inpatient Procedure: Limited Echo, Limited Color Doppler and Cardiac Doppler STAT ECHO Indications:    pericardial effusion  History:        Patient has no prior history of Echocardiogram examinations.                 Lung cancer; Arrythmias:Atrial  Fibrillation.  Sonographer:    Johny Chess Referring Phys: [place]  1. Moderate to large pericardial effusion. The pericardial effusion is circumferential. Slightly larger compared to the 01/08/20 study. Effusion measures 1.6-1.8 cm adjacent to the RV in diastole depending on where measured. There is 10% respiratory variation across the MV, not significant. Partial RA diastolic indentation without full collapse. No RV collapse. IVC is not visualized. Overall findings not consistent with tamponade physiology.  2. Of note appears patient has two medical record numbers in regards to finding prior data and studies. FINDINGS  Pericardium: Effusion measures 1.6-1.8 cm adjacent to the RV in diastole depending on where measured. There is 10% respiratory variation across the MV, not significant. Partial RA diastolic collapse. No RV collapse. IVC is not visualized. Moderate to large pericardial effusion. The pericardial effusion is circumferential. Carlyle Dolly MD Electronically signed by Carlyle Dolly MD Signature Date/Time: 03/13/2020/8:58:41 PM    Final    CT Maxillofacial WO CM  Result Date: 03/13/2020 CLINICAL DATA:  [place]. Facial trauma. EXAM: CT HEAD WITHOUT CONTRAST CT MAXILLOFACIAL WITHOUT CONTRAST CT CERVICAL SPINE WITHOUT CONTRAST TECHNIQUE: Multidetector CT imaging of the head, cervical spine, and maxillofacial structures were performed using the standard protocol without intravenous contrast. Multiplanar CT image reconstructions of the cervical spine and maxillofacial structures were also generated. COMPARISON:  MRI brain 06/30/2019 and head CT from 2015. FINDINGS: CT HEAD FINDINGS Brain: Stable age related cerebral atrophy, ventriculomegaly and periventricular white matter disease. Scattered basal ganglia calcifications. No extra-axial fluid collections are identified. No CT findings for acute hemispheric infarction or intracranial hemorrhage. No mass lesions. The brainstem and  cerebellum are normal. Vascular: Vascular calcifications but no aneurysm or hyperdense vessels. Skull: No skull fracture or bone lesions. Other: No scalp lesions or hematoma. CT MAXILLOFACIAL FINDINGS Osseous: Exam limited by patient motion but no definite facial bone fractures are identified. The mandibular condyles are normally located. No obvious mandible fracture. Orbits: The bony orbits are intact. Of the globes are intact. Sinuses: Patchy ethmoid sinus disease and partial opacification of the right half of the sphenoid sinus. The right maxillary sinus contains a mucous retention cyst or polyp. Soft tissues: No significant soft tissue findings. CT CERVICAL SPINE FINDINGS Alignment: Normal overall alignment. There is severe degenerative cervical spondylosis with multilevel disc disease and facet disease and mild multilevel degenerative subluxations. Skull base and vertebrae: Exam limited by motion artifact but no definite acute fractures. Soft tissues and spinal canal: No prevertebral fluid or swelling. No visible canal hematoma. Disc levels: The spinal canal is patent. Mild multilevel spinal and foraminal stenosis but no large disc protrusions. Upper chest: Emphysematous changes are noted at the lung apices with dense scarring changes. Other: Carotid artery calcifications are noted. IMPRESSION: 1. Stable age related cerebral atrophy, ventriculomegaly and periventricular white matter disease. 2. No acute intracranial findings or skull fracture. 3. No definite acute facial bone fractures. 4. Severe degenerative cervical spondylosis with multilevel disc disease and facet disease but no definite acute cervical spine fracture. Electronically Signed   By: Marijo Sanes M.D.   On:  03/13/2020 10:09        Scheduled Meds: . apixaban  5 mg Oral BID  . Chlorhexidine Gluconate Cloth  6 each Topical Daily  . demeclocycline  300 mg Oral BID  . dextromethorphan-guaiFENesin  1 tablet Oral BID  . diltiazem  180 mg  Oral Daily  . docusate sodium  100 mg Oral BID  . levothyroxine  50 mcg Oral Q0600  . lidocaine  1 patch Transdermal Q24H  . mirtazapine  30 mg Oral QHS  . sodium chloride flush  3 mL Intravenous Q12H  . tamsulosin  0.4 mg Oral BID   Continuous Infusions: . sodium chloride 100 mL/hr at 03/13/20 2218     LOS: 0 days    Time spent: [place], MD [facility] Pager [phone]  If 7PM-7AM, please contact night-coverage [email or web address] Password [email or web address] 03/14/2020, 2:50 PM

## 2020-03-15 LAB — BASIC METABOLIC PANEL
Anion gap: 5 (ref 5–15)
BUN: 11 mg/dL (ref 8–23)
CO2: 23 mmol/L (ref 22–32)
Calcium: 7.9 mg/dL — ABNORMAL LOW (ref 8.9–10.3)
Chloride: 97 mmol/L — ABNORMAL LOW (ref 98–111)
Creatinine, Ser: 0.54 mg/dL — ABNORMAL LOW (ref 0.61–1.24)
GFR calc Af Amer: >60 mL/min (ref >60)
GFR calc non Af Amer: >60 mL/min (ref >60)
Glucose, Bld: 108 mg/dL — ABNORMAL HIGH (ref 70–99)
Potassium: 4.1 mmol/L (ref 3.5–5.1)
Sodium: 125 mmol/L — ABNORMAL LOW (ref 135–145)

## 2020-03-15 NOTE — Plan of Care (Signed)

## 2020-03-15 NOTE — Evaluation (Signed)
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 Pt now admitted for symptomatic hyponatremia, sustained posterior R 4-8 rib fractures due to fall.     Clinical Impression  Pt presents with moderate limitations to mobility due to weakness, limited joint ROM, pain, poor cardiopulmonary function, balance impairment, anxiety, and cognitive changes resulting in difficulties performing bed mobility, changing positions, and moving around, requires human assist for safety and emotional support.  Will benefit from PT to address deficits with goal of restoring maximal functional independence.  PT will initiate care in acute setting and recommended return home with 24 assistance and HHPT.  Nursing should use rolling walker for daily mobility needs.     Follow Up Recommendations Home health PT;Supervision/Assistance - 24 hour    Equipment Recommendations  None recommended by PT    Recommendations for Other Services       Precautions / Restrictions Precautions Precautions: Fall Precaution Comments: colostomy; weakness and decr WB tolerance LLE Restrictions Weight Bearing Restrictions: No Other Position/Activity Restrictions: none listed or endorsed following recent hip surgery - confirmed with Josh, RN      Mobility  Bed Mobility Overal bed mobility: Needs Assistance Bed Mobility:  Supine to Sit     Supine to sit: Min assist     General bed mobility comments: HOB up, assist to raise trunk and pivot to EOB  Transfers Overall transfer level: Needs assistance Equipment used: Rolling walker (2 wheeled) Transfers: Sit to/from Stand Sit to Stand: Min assist;+2 safety/equipment;From elevated surface         General transfer comment: raised bed to simulate home and provide leverage to compenstate for weakness and pain; needs cues for hand placement and steady assist once in standing  Ambulation/Gait Ambulation/Gait assistance: Min assist;+2 safety/equipment Gait Distance (Feet): 5 Feet Assistive device: Rolling walker (2 wheeled) Gait Pattern/deviations: Antalgic     General Gait Details: decr WB through LLE preferentially, instruction for best sequencing to minimize LLE load and max safety and confidence; steps to end of bed where recliner was placed and pt quite fatigue by end  Stairs            Wheelchair Mobility    Modified Rankin (Stroke Patients Only)       Balance Overall balance assessment: Needs assistance;History of Falls Sitting-balance support: Feet supported;No upper extremity supported Sitting balance-Leahy Scale: Fair     Standing balance support: Bilateral upper extremity supported;During functional activity Standing balance-Leahy Scale: Poor Standing balance comment: depends on UE support for standing stability                             Pertinent Vitals/Pain Pain Assessment: Faces Faces Pain Scale: Hurts even more Pain Location: R ribcage, L knee Pain Descriptors / Indicators: Discomfort;Grimacing;Sharp Pain Intervention(s): Limited activity within patient's tolerance;Monitored during session;Repositioned;Ice applied(ice to L knee w/instruct for 15-20 min max)    Home Living Family/patient expects to be discharged to:: Private residence Living Arrangements:  Spouse/significant other;Children Available Help at  Discharge: Family Type of Home: House Home Access: Other (comment)(to be assessed)       Home Equipment: Walker - 2 wheels;Shower seat Additional Comments: will need to clarify home setup and DME    Prior Function Level of Independence: Independent with assistive device(s)         Comments: reports typically ambulatory with RW, and able to perform ADL without assist     Hand Dominance   Dominant Hand: Right    Extremity/Trunk Assessment   Upper Extremity Assessment Upper Extremity Assessment: Defer to OT evaluation    Lower Extremity Assessment Lower Extremity Assessment: Generalized weakness;LLE deficits/detail LLE Deficits / Details: swelling and pain in L knee, decr WB tolerance, decr extension through joint    Cervical / Trunk Assessment Cervical / Trunk Assessment: Kyphotic(due to respiratory history)  Communication   Communication: No difficulties  Cognition Arousal/Alertness: Awake/alert Behavior During Therapy: Anxious;WFL for tasks assessed/performed Overall Cognitive Status: Impaired/Different from baseline Area of Impairment: Memory;Attention                   Current Attention Level: Selective Memory: Decreased short-term memory         General Comments: pt aware of STM deficits and concerned that he doesn't remember his wife/daughter visiting yesterday. Easily distracted and concerned about lab values, O2 sats, etc during session      General Comments General comments (skin integrity, edema, etc.): pt does not use O2 at home, on 2L currently via [place], removed to extend tubing, noting slight decrease in oxygenation, improves with replacement of O2. see OT note as well    Exercises     Assessment/Plan    PT Assessment Patient needs continued PT services  PT Problem List Decreased strength;Decreased range of motion;Decreased activity tolerance;Decreased balance;Decreased mobility;Decreased coordination;Decreased cognition;Decreased knowledge  of use of DME;Decreased knowledge of precautions;Cardiopulmonary status limiting activity;Pain       PT Treatment Interventions Modalities;Patient/family education;Cognitive remediation;Balance training;Therapeutic exercise;Therapeutic activities;Functional mobility training;Stair training;Gait training;DME instruction    PT Goals (Current goals can be found in the Care Plan section)  Acute Rehab PT Goals Patient Stated Goal: rehab at home PT Goal Formulation: With patient/family Time For Goal Achievement: 03/29/20 Potential to Achieve Goals: Good    Frequency Min 3X/week   Barriers to discharge   wife willing to assist    Co-evaluation   Reason for Co-Treatment: Necessary to address cognition/behavior during functional activity;For patient/therapist safety;To address functional/ADL transfers   OT goals addressed during session: ADL's and self-care       AM-PAC PT "6 Clicks" Mobility  Outcome Measure Help needed turning from your back to your side while in a flat bed without using bedrails?: A Little Help needed moving from lying on your back to sitting on the side of a flat bed without using bedrails?: A Little Help needed moving to and from a bed to a chair (including a wheelchair)?: A Little Help needed standing up from a chair using your arms (e.g., wheelchair or bedside chair)?: A Little Help needed to walk in hospital room?: A Lot Help needed climbing 3-5 steps with a railing? : Total 6 Click Score: 15    End of Session Equipment Utilized During Treatment: Gait belt;Oxygen Activity Tolerance: Patient limited by fatigue;Patient limited by pain Patient left: in chair;with call bell/phone within reach;with chair alarm set;with family/visitor present Nurse Communication: Mobility status;Precautions PT Visit Diagnosis: Muscle weakness (generalized) (M62.81);History of falling (Z91.81);Pain;Difficulty in walking, not elsewhere classified (  R26.2) Pain - Right/Left:  Left Pain - part of body: Knee(and R ribcage at fx sites)    Time: 2761-8485 PT Time Calculation (min) (ACUTE ONLY): 29 min   Charges:   PT Evaluation $PT Eval Moderate Complexity: 1 Mod          Kearney Hard, PT, DPT, MS Board Certified Geriatric Clinical Specialist   Herbie Drape 03/15/2020, 11:34 AM

## 2020-03-15 NOTE — Progress Notes (Signed)
Some improvement in respiratory status, patient able to sleep , SAT 90-93 % on 2L .Foley to fravity good urine output, NS via peripheral access @100cc /hr

## 2020-03-15 NOTE — Progress Notes (Signed)
PROGRESS NOTE  Gordon Vandunk  GGY:[id number] DOB: [birth year]  DOA: 03/13/2020 PCP: Patient, No Pcp Per   Brief Narrative:  Lisle Skillman is a 79 y.o. 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 Patient was hospitalized in January 2021 for perforated diverticulitis status post sigmoid colectomy with colostomy, development of ischemic left hand status post left brachial embolectomy and COVID-19 pneumonia.  He was also hospitalized in March after a fall complicated by hip fracture.  He has been feeling tired with intermittent dizziness of late, and whilst getting ready for a urology appointment he fell to the ground was standing with his walker without loss of consciousness.  ED imaging and twelve-lead EKG were unremarkable for any acute findings, but was noted to have significant hyponatremia from baseline, and he was admitted for symptomatic hyponatremia.  Assessment & Plan:   Active Problems:   Hyponatremia   Stage 4 lung cancer, unspecified laterality (HCC)   Pericardial effusion   Atrial fibrillation, chronic ([place])   Fall (on)(from) sidewalk curb, initial encounter   Multiple fractures of rib involving four or more ribs   Diverticulitis   Hypothyroidism (acquired)   Symptomatic Hyponatremia -Patient with recurrent falls  -He was standing with his walker today and collapsed; he does not remember the event and is uncertain why he fell but his wife reports no LOC -He has baseline sodium 130-132 (admitting sodium 124; this is thought to be contributing to his weakness) -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 -He was due for routine repeat CT C/A/P on 4/22 as outpatient, therefore this was done on admission and noted: Increased pericardial effusion- moderate to marked  increase in size, increasing left pleural effusion with bronchial wall thickening concerning for airspace disease possibly aspiration pneumonitis -2D echo 03/13/2020 -Moderate to large pericardial effusion-seen by cardiology, no intervention at this time, clinical follow-up Per primary cardiologist as outpatient on discharge -Continue demeclocycline  Pericardial effusion Worsening pain CT report 2D echo 03/13/2020-moderate to large pericardial effusion No clinical evidence of tamponade-no further work-up at this time  Stage 4 lung cancer -Diagnosed in 09/2013 -He is treated with Opdivo immunotherapy -Treatment has been on hold since 11/2019 -Based on weakness, frailty, and cachexia, therapy was held at his last visit in March and he has been scheduled for restaging CT C/A/P -Due to cough, SOB, and weakness with falls, repeat imaging was performed today -Will add Dr. [name] to the treatment team and I sent him a secure chat message today asking if he can see the patient tomorrow to review the scans with him -Once again, there is noted concern for possible aspiration pneumonitis - which could be contributing to his cough; will make NPO and plan for ST swallow evaluation tomorrow  R posterior rib fractures -Patient c/o R-sided back pain just below his scapula with cough -Imaging indicates posterior right-sided rib fractures - which is likely the source of his pain -Will give pain control with Vicodin, Morphine, and add Lidocaine patch  Urinary retention -Has indwelling foley -Failed his last voiding trial and he was scheduled to f/u with urology today -Will leave foley in for now -Continue Flomax  Afib -On Eliquis, will continue -Rate control with Diltiazem   DVT prophylaxis: Eliquis Code Status: DNR Family Communication: Wife at bedside and daughter by telephone Disposition Plan:  Home   Consultants:  Cardiology, by admitting physician  Procedures:     Antimicrobials:       Subjective: No acute events but overnight.  Denies any chest pain.  Breathing better  Objective:  Vitals:   03/15/20 0028 03/15/20 0400 03/15/20 0500 03/15/20 0745  BP:  (!) 131/51  (!) 135/57  Pulse: 92 93  77  Resp: 18 20  17   Temp:  98.5 F (36.9 C)  98.5 F (36.9 C)  TempSrc:  Oral  Oral  SpO2: 95% 93%  96%  Weight:   62.7 kg   Height:        Intake/Output Summary (Last 24 hours) at 03/15/2020 1304 Last data filed at 03/15/2020 0500 Gross per 24 hour  Intake 800 ml  Output 700 ml  Net 100 ml   Filed Weights   03/13/20 0944 03/15/20 0500  Weight: 61.2 kg 62.7 kg    Examination:  General exam: Comfortable, no acute distress Respiratory system: Coarse breath sounds.  Respiratory effort normal. Cardiovascular system: S1 & S2 heard, RRR. No JVD, murmurs, rubs, gallops or clicks. No pedal edema. Gastrointestinal system: Abdomen is nondistended, soft and nontender. No organomegaly or masses felt. Normal bowel sounds heard. Central nervous system: Alert and oriented. No focal neurological deficits. Extremities: Symmetric 5 x 5 power. Psychiatry: Judgement and insight appear normal. Mood & affect appropriate.     Data Reviewed: I have personally reviewed following labs and imaging studies  CBC: Recent Labs  Lab 03/13/20 1155 03/14/20 0258  WBC 9.3 7.8  NEUTROABS 7.2  --   HGB 11.8* 11.8*  HCT 35.6* 34.9*  MCV 86.6 85.5  PLT 395 161   Basic Metabolic Panel: Recent Labs  Lab 03/13/20 1155 03/14/20 0258 03/15/20 0247  NA 124* 125* 125*  K 4.6 4.4 4.1  CL 92* 94* 97*  CO2 26 23 23   GLUCOSE 103* 98 108*  BUN 12 8 11   CREATININE 0.60* 0.59* 0.54*  CALCIUM 8.7* 8.4* 7.9*   GFR: Estimated Creatinine Clearance: 65.3 mL/min (A) (by C-G formula based on SCr of 0.54 mg/dL (L)). Liver Function Tests: Recent Labs  Lab 03/13/20 1155  AST 30  ALT 19  ALKPHOS 124  BILITOT 0.7  PROT 5.4*  ALBUMIN 2.9*   No results for input(s): LIPASE, AMYLASE in  the last 168 hours. No results for input(s): AMMONIA in the last 168 hours. Coagulation Profile: No results for input(s): INR, PROTIME in the last 168 hours. Cardiac Enzymes: No results for input(s): CKTOTAL, CKMB, CKMBINDEX, TROPONINI in the last 168 hours. BNP (last 3 results) No results for input(s): PROBNP in the last 8760 hours. HbA1C: No results for input(s): HGBA1C in the last 72 hours. CBG: No results for input(s): GLUCAP in the last 168 hours. Lipid Profile: No results for input(s): CHOL, HDL, LDLCALC, TRIG, CHOLHDL, LDLDIRECT in the last 72 hours. Thyroid Function Tests: No results for input(s): TSH, T4TOTAL, FREET4, T3FREE, THYROIDAB in the last 72 hours. Anemia Panel: No results for input(s): VITAMINB12, FOLATE, FERRITIN, TIBC, IRON, RETICCTPCT in the last 72 hours.  Sepsis Labs: Recent Labs  Lab 03/13/20 1155 03/14/20 0258  WBC 9.3 7.8    Recent Results (from the past 240 hour(s))  SARS CORONAVIRUS 2 (TAT 6-24 HRS) Nasopharyngeal Nasopharyngeal Swab     Status: None   Collection Time: 03/13/20  1:51 PM   Specimen: Nasopharyngeal Swab  Result Value Ref Range Status   SARS Coronavirus 2 NEGATIVE NEGATIVE Final    Comment: (NOTE)  SARS-CoV-2 target nucleic acids are NOT DETECTED. The SARS-CoV-2 RNA is generally detectable in upper and lower respiratory specimens during the acute phase of infection. Negative results do not preclude SARS-CoV-2 infection, do not rule out co-infections with other pathogens, and should not be used as the sole basis for treatment or other patient management decisions. Negative results must be combined with clinical observations, patient history, and epidemiological information. The expected result is Negative. Fact Sheet for Patients: SugarRoll.be Fact Sheet for Healthcare Providers: https://www.woods-mathews.com/ This test is not yet approved or cleared by the [place] FDA and  has been  authorized for detection and/or diagnosis of SARS-CoV-2 by FDA under an Emergency Use Authorization (EUA). This EUA will remain  in effect (meaning this test can be used) for the duration of the COVID-19 declaration under Section 56 4(b)(1) of the Act, 21 U.S.C. section 360bbb-3(b)(1), unless the authorization is terminated or revoked sooner. Performed at [facility], [place] [address] [place], [place] [address]          Radiology Studies: CT CHEST W CONTRAST  Addendum Date: 03/13/2020   ADDENDUM REPORT: 03/13/2020 19:50 ADDENDUM: Signs of interval left hip arthroplasty. Fluid about the arthroplasty may be postoperative given recent placement. Correlate with any clinical signs of infection. Electronically Signed   By: Zetta Bills M.D.   On: 03/13/2020 19:50   Result Date: 03/13/2020 CLINICAL DATA:  Small cell lung cancer assess treatment response. Also experience ground level fall, on Eliquis. EXAM: CT CHEST, ABDOMEN, AND PELVIS WITH CONTRAST TECHNIQUE: Multidetector CT imaging of the chest, abdomen and pelvis was performed following the standard protocol during bolus administration of intravenous contrast. CONTRAST:  144mL OMNIPAQUE IOHEXOL 300 MG/ML  SOLN COMPARISON:  01/23/2020 FINDINGS: CT CHEST FINDINGS Cardiovascular: Calcified and noncalcified atherosclerotic plaque throughout the aorta its branches in the chest. No aneurysmal dilation of thoracic aorta. Are stable in terms of mild dilation. Enlarging pericardial effusion, 2 cm thickness along the mid cardiac contour previously approximately 1 cm. Mediastinum/Nodes: Limited assessment of thoracic inlet structures due to motion artifact. No axillary lymphadenopathy. No hilar lymphadenopathy. No mediastinal lymphadenopathy. Esophagus grossly normal. Lungs/Pleura: Bronchial wall thickening at the left hilum with signs of lower lobe bronchiectasis, this is developed since the previous study. Small left-sided effusion is enlarged. Small  right effusion with basilar volume loss. Multifocal areas of ground-glass nodularity within aerated portions of the chest and areas of part solid nodules, similar to the prior study. (Image 47, series 4) 2.8 x 1.8 cm, previously 2.9 x 1.6 cm. Solid component appears similar. Ground-glass with increased soft tissue density with subpleural bandlike opacity that has developed amidst ground-glass changes in the right upper lobe (image 57, series 4) 1.7 by 0.7 cm, no solid-appearing abnormality was present in this location on the previous exam. Pure ground-glass lesion in the left upper lobe shows no solid component on today's study measuring approximately 2.4 cm in greatest axial dimension appearing more conspicuous but unchanged in terms of size. Areas of volume loss and opacity in the right lower lobe show increasing density along the superior aspect in the superior segment of the right lower lobe (image 78, series 4) 2.5 x 1.6 cm, this area previously measured approximately 2 point 2 by 1.2 cm. Background pulmonary emphysema. Musculoskeletal: No chest wall mass. See below for full musculoskeletal details. CT ABDOMEN PELVIS FINDINGS Hepatobiliary: Small hyperdense focus in the dome of the right hemi liver approximately cm unchanged likely a small hemangioma. Low-density focus along the margin  of the inferior right hemi liver anteriorly (image 63, series 7) 8 mm, unchanged as is another area of low-density in hepatic subsegment VI measuring 15 mm. Pancreas: Pancreas is normal without focal lesion or ductal dilation. Spleen: Spleen is normal size without focal lesion. Adrenals/Urinary Tract: Adrenal glands with mild thickening of the right adrenal gland unchanged. Left adrenal gland is normal. Renal enhancement is symmetric with some scarring along the lateral cortex of the right kidney. Small low-density foci in the left kidney are likely small cysts and unchanged. No hydronephrosis. Stomach/Bowel: No acute small bowel  process. 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 Stool fills much of the colon. The appendix is normal. Vascular/Lymphatic: Calcified and noncalcified plaque of the abdominal aorta without aneurysm. No abdominal adenopathy. No pelvic adenopathy. Reproductive: Prostate is small. Foley catheter in the urinary bladder. Other: Left lower quadrant colostomy. No free air. No ascites. Musculoskeletal: Signs of previous trauma to the right chest but with new right-sided rib fractures involving the right fourth, sixth, seventh and eighth ribs posteriorly. Acute fracture also of anterior right sixth rib. Sternum is intact. Costochondral elements are intact. Marked glenohumeral degenerative changes on the right with post operative changes of left shoulder arthroplasty as before. Signs of prior rib fracture on the left as well with similar appearance to the previous study. Multilevel spinal degenerative change. IMPRESSION: 1. Increased pericardial effusion. Echocardiography may be helpful given moderate to marked increase in size since previous examination. 2. Signs of previous trauma to the right chest but with new right-sided rib fractures involving the right fourth, sixth, seventh and eighth ribs posteriorly. 3. Increasing left effusion with bronchial wall thickening in the left lung base. Findings could be related to aspiration pneumonitis and are associated with airspace disease on today's study. 4. Increasing nodularity in the right chest. Correlate with any ongoing radiotherapy. Pneumonitis or worsening of disease is also considered. Short interval follow-up may be helpful as findings are of uncertain significance in the setting of trauma with right-sided rib fractures. 5. Areas also in the right lower lobe that warrant attention on follow-up, potentially related to posttraumatic changes or inflammation though disease worsening is also considered. 6. Small left-sided effusion is  enlarged. Electronically Signed: By: Zetta Bills M.D. On: 03/13/2020 17:20   CT ABDOMEN PELVIS W CONTRAST  Addendum Date: 03/13/2020   ADDENDUM REPORT: 03/13/2020 19:50 ADDENDUM: Signs of interval left hip arthroplasty. Fluid about the arthroplasty may be postoperative given recent placement. Correlate with any clinical signs of infection. Electronically Signed   By: Zetta Bills M.D.   On: 03/13/2020 19:50   Result Date: 03/13/2020 CLINICAL DATA:  Small cell lung cancer assess treatment response. Also experience ground level fall, on Eliquis. EXAM: CT CHEST, ABDOMEN, AND PELVIS WITH CONTRAST TECHNIQUE: Multidetector CT imaging of the chest, abdomen and pelvis was performed following the standard protocol during bolus administration of intravenous contrast. CONTRAST:  122mL OMNIPAQUE IOHEXOL 300 MG/ML  SOLN COMPARISON:  01/23/2020 FINDINGS: CT CHEST FINDINGS Cardiovascular: Calcified and noncalcified atherosclerotic plaque throughout the aorta its branches in the chest. No aneurysmal dilation of thoracic aorta. Are stable in terms of mild dilation. Enlarging pericardial effusion, 2 cm thickness along the mid cardiac contour previously approximately 1 cm. Mediastinum/Nodes: Limited assessment of thoracic inlet structures due to motion artifact. No axillary lymphadenopathy. No hilar lymphadenopathy. No mediastinal lymphadenopathy. Esophagus grossly normal. Lungs/Pleura: Bronchial wall thickening at the left hilum with signs of lower lobe bronchiectasis, this is developed since the  previous study. Small left-sided effusion is enlarged. Small right effusion with basilar volume loss. Multifocal areas of ground-glass nodularity within aerated portions of the chest and areas of part solid nodules, similar to the prior study. (Image 47, series 4) 2.8 x 1.8 cm, previously 2.9 x 1.6 cm. Solid component appears similar. Ground-glass with increased soft tissue density with subpleural bandlike opacity that has developed  amidst ground-glass changes in the right upper lobe (image 57, series 4) 1.7 by 0.7 cm, no solid-appearing abnormality was present in this location on the previous exam. Pure ground-glass lesion in the left upper lobe shows no solid component on today's study measuring approximately 2.4 cm in greatest axial dimension appearing more conspicuous but unchanged in terms of size. Areas of volume loss and opacity in the right lower lobe show increasing density along the superior aspect in the superior segment of the right lower lobe (image 78, series 4) 2.5 x 1.6 cm, this area previously measured approximately 2 point 2 by 1.2 cm. Background pulmonary emphysema. Musculoskeletal: No chest wall mass. See below for full musculoskeletal details. CT ABDOMEN PELVIS FINDINGS Hepatobiliary: Small hyperdense focus in the dome of the right hemi liver approximately cm unchanged likely a small hemangioma. Low-density focus along the margin of the inferior right hemi liver anteriorly (image 63, series 7) 8 mm, unchanged as is another area of low-density in hepatic subsegment VI measuring 15 mm. Pancreas: Pancreas is normal without focal lesion or ductal dilation. Spleen: Spleen is normal size without focal lesion. Adrenals/Urinary Tract: Adrenal glands with mild thickening of the right adrenal gland unchanged. Left adrenal gland is normal. Renal enhancement is symmetric with some scarring along the lateral cortex of the right kidney. Small low-density foci in the left kidney are likely small cysts and unchanged. No hydronephrosis. Stomach/Bowel: No acute small bowel process. 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 Stool fills much of the colon. The appendix is normal. Vascular/Lymphatic: Calcified and noncalcified plaque of the abdominal aorta without aneurysm. No abdominal adenopathy. No pelvic adenopathy. Reproductive: Prostate is small. Foley catheter in the urinary bladder. Other: Left lower  quadrant colostomy. No free air. No ascites. Musculoskeletal: Signs of previous trauma to the right chest but with new right-sided rib fractures involving the right fourth, sixth, seventh and eighth ribs posteriorly. Acute fracture also of anterior right sixth rib. Sternum is intact. Costochondral elements are intact. Marked glenohumeral degenerative changes on the right with post operative changes of left shoulder arthroplasty as before. Signs of prior rib fracture on the left as well with similar appearance to the previous study. Multilevel spinal degenerative change. IMPRESSION: 1. Increased pericardial effusion. Echocardiography may be helpful given moderate to marked increase in size since previous examination. 2. Signs of previous trauma to the right chest but with new right-sided rib fractures involving the right fourth, sixth, seventh and eighth ribs posteriorly. 3. Increasing left effusion with bronchial wall thickening in the left lung base. Findings could be related to aspiration pneumonitis and are associated with airspace disease on today's study. 4. Increasing nodularity in the right chest. Correlate with any ongoing radiotherapy. Pneumonitis or worsening of disease is also considered. Short interval follow-up may be helpful as findings are of uncertain significance in the setting of trauma with right-sided rib fractures. 5. Areas also in the right lower lobe that warrant attention on follow-up, potentially related to posttraumatic changes or inflammation though disease worsening is also considered. 6. Small left-sided effusion is enlarged. Electronically Signed: By:  Zetta Bills M.D. On: 03/13/2020 17:20   ECHOCARDIOGRAM LIMITED  Result Date: 03/13/2020    ECHOCARDIOGRAM LIMITED REPORT   Patient Name:   TREVIAN HAYASHIDA Date of Exam: 03/13/2020 Medical Rec #:  [id number]     Height:       71.0 in Accession #:    [id number]    Weight:       135.0 lb Date of Birth:  07/23/1941    BSA:          1.784 m  Patient Age:    32 years      BP:           162/68 mmHg Patient Gender: M             HR:           97 bpm. Exam Location:  Inpatient Procedure: Limited Echo, Limited Color Doppler and Cardiac Doppler STAT ECHO Indications:    pericardial effusion  History:        Patient has no prior history of Echocardiogram examinations.                 Lung cancer; Arrythmias:Atrial Fibrillation.  Sonographer:    Johny Chess Referring Phys: [place]  1. Moderate to large pericardial effusion. The pericardial effusion is circumferential. Slightly larger compared to the 01/08/20 study. Effusion measures 1.6-1.8 cm adjacent to the RV in diastole depending on where measured. There is 10% respiratory variation across the MV, not significant. Partial RA diastolic indentation without full collapse. No RV collapse. IVC is not visualized. Overall findings not consistent with tamponade physiology.  2. Of note appears patient has two medical record numbers in regards to finding prior data and studies. FINDINGS  Pericardium: Effusion measures 1.6-1.8 cm adjacent to the RV in diastole depending on where measured. There is 10% respiratory variation across the MV, not significant. Partial RA diastolic collapse. No RV collapse. IVC is not visualized. Moderate to large pericardial effusion. The pericardial effusion is circumferential. Carlyle Dolly MD Electronically signed by Carlyle Dolly MD Signature Date/Time: 03/13/2020/8:58:41 PM    Final    DG Knee 3 Views Left  Result Date: 03/14/2020 CLINICAL DATA:  Left knee swelling, recent fall EXAM: LEFT KNEE - 3 VIEW COMPARISON:  None. FINDINGS: No evidence of acute fracture. No dislocation. Moderate to severe tricompartmental osteoarthritis, most pronounced within the lateral compartment. Chondrocalcinosis noted. Small knee joint effusion. Mild suprapatellar soft tissue swelling. IMPRESSION: 1. No acute osseous abnormality. 2. Moderate to severe tricompartmental  osteoarthritis. 3. Small knee joint effusion. Mild suprapatellar soft tissue swelling. Electronically Signed   By: Davina Poke D.O.   On: 03/14/2020 15:37        Scheduled Meds: . apixaban  5 mg Oral BID  . Chlorhexidine Gluconate Cloth  6 each Topical Daily  . demeclocycline  300 mg Oral BID  . dextromethorphan-guaiFENesin  1 tablet Oral BID  . diltiazem  180 mg Oral Daily  . docusate sodium  100 mg Oral BID  . levothyroxine  50 mcg Oral Q0600  . lidocaine  1 patch Transdermal Q24H  . mirtazapine  30 mg Oral QHS  . sodium chloride flush  3 mL Intravenous Q12H  . tamsulosin  0.4 mg Oral BID   Continuous Infusions: . sodium chloride 100 mL/hr at 03/15/20 1016     LOS: 1 day    Time spent: [place], MD [facility] Pager [phone]  If 7PM-7AM, please contact night-coverage [email or web address]  Password [email or web address] 03/15/2020, 1:04 PM

## 2020-03-15 NOTE — Evaluation (Signed)
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 Pt now admitted for symptomatic hyponatremia, sustained posterior R 4-8 rib fractures due to fall.    Clinical Impression   This 79 y/o male presents with the above. PTA pt reports using RW for short distance functional mobility and reports able to perform most ADL with mod independence. Pt now presenting with pain, weakness, impaired cognition and decreased activity tolerance. Pt requiring min encouragement to participate in session and progress OOB to recliner. Pt able to complete functional transfers and take few steps to recliner with overall minA+2 using RW. He currently requires at least modA for LB ADL, minguard assist for seated UB ADL. Pt requiring 2L O2 during session to maintain O2 sats >/=90%. Spouse present end of session and both pt/pt spouse adamant to return home with [facility] therapies vs SNF at time of discharge. Will continue to follow while acutely admitted to progress pt's safety and independence with ADL and mobility.     Follow Up Recommendations  Home health OT;Supervision/Assistance - 24 hour(pt/pt spouse declining SNF at this time)    Equipment Recommendations  3 in 1 bedside commode(if pt doesn't already have)       Precautions / Restrictions Precautions Precautions: Fall Precaution Comments:  colostomy Restrictions Weight Bearing Restrictions: No      Mobility Bed Mobility Overal bed mobility: Needs Assistance Bed Mobility: Supine to Sit     Supine to sit: Min assist     General bed mobility comments: assist for trunk elevation  Transfers Overall transfer level: Needs assistance Equipment used: Rolling walker (2 wheeled) Transfers: Sit to/from Stand Sit to Stand: Min assist;+2 safety/equipment;From elevated surface         General transfer comment: cues for safe hand placement, stood x2 from EOB with boosting/steadying assist     Balance Overall balance assessment: Needs assistance;History of Falls Sitting-balance support: Feet supported Sitting balance-Leahy Scale: Fair     Standing balance support: Bilateral upper extremity supported Standing balance-Leahy Scale: Poor Standing balance comment: reliant on UE support/external assist                            ADL either performed or assessed with clinical judgement   ADL Overall ADL's : Needs assistance/impaired Eating/Feeding: Modified independent;Sitting Eating/Feeding Details (indicate cue type and reason): pt eating breakfast end of session while seated in recliner  Grooming: Set up;Min guard;Sitting   Upper Body Bathing: Min guard;Sitting   Lower Body Bathing: Moderate assistance;Sit to/from stand   Upper Body Dressing : Min guard;Sitting   Lower Body Dressing: Moderate assistance;Sit to/from stand Lower Body Dressing Details (indicate cue type and reason): assist to don shoes today seated EOB Toilet Transfer: Minimal assistance;+2 for physical assistance;Stand-pivot;RW Toilet Transfer Details (indicate cue type and reason): simulated via transfer to [place] and Hygiene: Minimal assistance;Sit to/from stand       Functional mobility during ADLs: Minimal assistance;+2 for physical assistance;Rolling walker  General ADL Comments: pt with weakness,  impaired cognition, decreased activity tolerance and respiratory status                         Pertinent Vitals/Pain Pain Assessment: Faces Faces Pain Scale: Hurts even more Pain Location: R ribcage, L knee Pain Descriptors / Indicators: Discomfort;Grimacing;Sharp Pain Intervention(s): Limited activity within patient's tolerance;Monitored during session;Repositioned     Hand Dominance     Extremity/Trunk Assessment Upper Extremity Assessment Upper Extremity Assessment: Generalized weakness   Lower Extremity Assessment Lower Extremity Assessment: Defer to PT evaluation       Communication Communication Communication: No difficulties   Cognition Arousal/Alertness: Awake/alert Behavior During Therapy: Anxious;WFL for tasks assessed/performed Overall Cognitive Status: Impaired/Different from baseline Area of Impairment: Memory;Attention                   Current Attention Level: Selective Memory: Decreased short-term memory         General Comments: pt aware of STM deficits and concerned that he doesn't remember his wife/daughter visiting yesterday. Easily distracted and concerned about lab values, O2 sats, etc during session   General Comments  pt on 2L O2 during session, requiring use of O2 to maintain sats >90%, dropping to the ?80s when attempted on RA (difficult to maintain good pleth for entirety of session)    Exercises     Shoulder Instructions      Home Living Family/patient expects to be discharged to:: Private residence Living Arrangements: Spouse/significant other;Children Available Help at Discharge: Family Type of Home: House             Bathroom Shower/Tub: Walk-in Corporate treasurer Toilet: [place]: Environmental consultant - 2 wheels;Shower seat   Additional Comments: will need to clarify home setup and DME      Prior Functioning/Environment Level of Independence: Independent with assistive device(s)         Comments: reports typically ambulatory with RW, and able to perform ADL without assist        OT Problem List: Decreased strength;Decreased range of motion;Decreased activity tolerance;Impaired balance (sitting and/or standing);Decreased cognition;Decreased safety awareness;Decreased knowledge of use of DME or AE;Cardiopulmonary status limiting activity;Pain      OT Treatment/Interventions: Self-care/ADL training;Therapeutic exercise;Energy conservation;DME and/or AE instruction;Therapeutic activities;Patient/family education;Balance training;Cognitive remediation/compensation    OT Goals(Current goals can be found in the care plan section) Acute Rehab OT Goals Patient Stated Goal: return home with [facility] when medically able OT Goal Formulation: With patient/family Time For Goal Achievement: 03/29/20 Potential to Achieve Goals: Good  OT Frequency: Min 2X/week   Barriers to D/C:            Co-evaluation PT/OT/SLP Co-Evaluation/Treatment: Yes Reason for Co-Treatment: Necessary to address cognition/behavior during functional activity;For patient/therapist safety;To address functional/ADL transfers   OT goals addressed during session: ADL's and self-care      AM-PAC OT "6 Clicks" Daily Activity     Outcome Measure Help from another person eating meals?: None Help from another person taking care of personal grooming?: A Little Help from another person toileting, which includes using toliet, bedpan, or urinal?: A Lot Help from another person bathing (including washing, rinsing, drying)?: A Lot Help from another person to put on and taking off regular upper body clothing?: A Little Help from another person to put on and taking off regular lower body clothing?: A Lot 6 Click Score: 16   End of Session Equipment Utilized During  Treatment: Rolling walker;Oxygen Nurse Communication: Mobility status  Activity Tolerance: Patient tolerated treatment well Patient left: in chair;with call  bell/phone within reach;with chair alarm set;with family/visitor present  OT Visit Diagnosis: Other abnormalities of gait and mobility (R26.89);Muscle weakness (generalized) (M62.81);History of falling (Z91.81);Pain Pain - Right/Left: Right Pain - part of body: (ribcage; L knee)                Time: 1740-8144 OT Time Calculation (min): 25 min Charges:  OT General Charges $OT Visit: 1 Visit OT Evaluation $OT Eval Moderate Complexity: [place], OT Acute Rehabilitation Services Pager [phone] Office [phone]   Raymondo Band 03/15/2020, 10:23 AM

## 2020-03-15 NOTE — Progress Notes (Signed)
Patient with low grade temp 99.8 , very restless-started on oxygen 2L via nasal cannula, stated on duonebs., evaluated by RT.

## 2020-03-16 ENCOUNTER — Telehealth: Payer: Self-pay | Admitting: Medical Oncology

## 2020-03-16 DIAGNOSIS — L8990 Pressure ulcer of unspecified site, unspecified stage: Secondary | ICD-10-CM | POA: Insufficient documentation

## 2020-03-16 LAB — COMPREHENSIVE METABOLIC PANEL
ALT: 13 U/L (ref 0–44)
AST: 15 U/L (ref 15–41)
Albumin: 2.1 g/dL — ABNORMAL LOW (ref 3.5–5.0)
Alkaline Phosphatase: 87 U/L (ref 38–126)
Anion gap: 7 (ref 5–15)
BUN: 10 mg/dL (ref 8–23)
CO2: 24 mmol/L (ref 22–32)
Calcium: 7.9 mg/dL — ABNORMAL LOW (ref 8.9–10.3)
Chloride: 98 mmol/L (ref 98–111)
Creatinine, Ser: 0.57 mg/dL — ABNORMAL LOW (ref 0.61–1.24)
GFR calc Af Amer: >60 mL/min (ref >60)
GFR calc non Af Amer: >60 mL/min (ref >60)
Glucose, Bld: 88 mg/dL (ref 70–99)
Potassium: 4.3 mmol/L (ref 3.5–5.1)
Sodium: 129 mmol/L — ABNORMAL LOW (ref 135–145)
Total Bilirubin: 1 mg/dL (ref 0.3–1.2)
Total Protein: 4.2 g/dL — ABNORMAL LOW (ref 6.5–8.1)

## 2020-03-16 NOTE — Progress Notes (Signed)
Physical Therapy Treatment Patient Details Name: Anthony Curry MRN: [id number] DOB: [birth year] Today's Date: 03/16/2020    History of Present Illness 79 yo male with PMH including afib, pericardial effusion, HTN, hyponatremia, stage IV SCC s/p radiation, remote adrenal cancer, ETOH dependence presents to ED with fall from w/c without LOC, medical diagnoses of symptomatic hyponatremia and posterior R 4-8 rib fractures.Pt with recent hospitalization in 12/2019 for perforated diverticulitis s/p sigmoid colectomy with colostomy, development of ischemic L hand s/p L brachial embolectomy and covid 19 PNA. Again hospitalized in 02/2020 due to hip fracture.    PT Comments    Pt very self-limiting today, states he is too anxious and scared to WB through LLE and walk. Pt performed bed mobility and transfers with min assist from PT, mostly to assist LLE. Pt moderately participated in standing pre-gait tasks, but unable to progress to ambulation due to anxiety and restlessness verging on irritation if pushed to mobilize further. Pt instructed in low-level, bed-level LLE exercises to perform to maintain strength and promote ROM. PT to continue to follow acutely, wife present during session and is supportive.     Follow Up Recommendations  Home health PT;Supervision/Assistance - 24 hour     Equipment Recommendations  None recommended by PT    Recommendations for Other Services       Precautions / Restrictions Precautions Precautions: Fall Precaution Comments: colostomy; weakness and decr WB tolerance LLE Restrictions Weight Bearing Restrictions: No Other Position/Activity Restrictions: none listed or endorsed following recent hip surgery - confirmed with Josh, RN    Mobility  Bed Mobility Overal bed mobility: Needs Assistance Bed Mobility: Supine to Sit;Sit to Supine     Supine to sit: Min assist;HOB elevated Sit to supine: Min assist;HOB elevated   General bed mobility comments: Min  assist for trunk and LE management, pt able to scoot to and from EOB with increased time and effort.  Transfers Overall transfer level: Needs assistance Equipment used: Rolling walker (2 wheeled) Transfers: Sit to/from Stand Sit to Stand: Min assist;From elevated surface         General transfer comment: Min assist for completion of power up, steadying, and raising to complete erect standing. Verbal cuing for waiting for PT prior to initiate standing as PT was moving pt's IV pole, but pt started initiating stand anyway. Able to participate in pre-gait stepping with LLE only, can only perform toe taps on R due to not tolerating LLE pain.  Ambulation/Gait             General Gait Details: unable to complete today   Stairs             Wheelchair Mobility    Modified Rankin (Stroke Patients Only)       Balance Overall balance assessment: Needs assistance;History of Falls Sitting-balance support: Feet supported;No upper extremity supported Sitting balance-Leahy Scale: Fair     Standing balance support: Bilateral upper extremity supported;During functional activity Standing balance-Leahy Scale: Poor Standing balance comment: depends on UE support for standing stability                            Cognition Arousal/Alertness: Awake/alert Behavior During Therapy: Anxious;WFL for tasks assessed/performed Overall Cognitive Status: Impaired/Different from baseline Area of Impairment: Memory;Attention                   Current Attention Level: Selective Memory: Decreased short-term memory  General Comments: Pt anxious throughout session, states "I am too scared to put weight on my left leg". Pt repeatedly asking during session "why is my left knee swollen? what would cause that?"      Exercises General Exercises - Lower Extremity Ankle Circles/Pumps: AROM;Both;10 reps;Supine Quad Sets: AAROM;Left;5 reps;Supine Heel Slides:  AROM;Left;Supine(tolerated 2 very limited ROM heel slides only)    General Comments General comments (skin integrity, edema, etc.): took O2 off for session, SpO2 89-92% on and off 2LO2 via . L knee Varus/valgus test (-), anterior drawer ACL test (-) for findings      Pertinent Vitals/Pain Pain Assessment: Faces Faces Pain Scale: Hurts even more Pain Location: R ribcage, L knee Pain Descriptors / Indicators: Discomfort;Grimacing;Sharp Pain Intervention(s): Limited activity within patient's tolerance;Monitored during session;Repositioned    Home Living                      Prior Function            PT Goals (current goals can now be found in the care plan section) Acute Rehab PT Goals Patient Stated Goal: rehab at home PT Goal Formulation: With patient/family Time For Goal Achievement: 03/29/20 Potential to Achieve Goals: Good Progress towards PT goals: Progressing toward goals    Frequency    Min 3X/week      PT Plan      Co-evaluation              AM-PAC PT "6 Clicks" Mobility   Outcome Measure  Help needed turning from your back to your side while in a flat bed without using bedrails?: A Little Help needed moving from lying on your back to sitting on the side of a flat bed without using bedrails?: A Little Help needed moving to and from a bed to a chair (including a wheelchair)?: A Little Help needed standing up from a chair using your arms (e.g., wheelchair or bedside chair)?: A Little Help needed to walk in hospital room?: Total Help needed climbing 3-5 steps with a railing? : Total 6 Click Score: 14    End of Session Equipment Utilized During Treatment: Oxygen Activity Tolerance: Patient limited by fatigue;Patient limited by pain Patient left: with call bell/phone within reach;with family/visitor present;in bed;with bed alarm set(wife in recliner, pt placed in chair position with LEs elevated due to L knee swelling) Nurse Communication:  Mobility status;Precautions PT Visit Diagnosis: Muscle weakness (generalized) (M62.81);History of falling (Z91.81);Pain;Difficulty in walking, not elsewhere classified (R26.2) Pain - Right/Left: Left Pain - part of body: Knee(and R ribcage at fx sites)     Time: 9753-0051 PT Time Calculation (min) (ACUTE ONLY): 26 min  Charges:  $Therapeutic Activity: 23-37 mins                    [name] Herd E, PT Acute Rehabilitation Services Pager [phone]  Office [phone]    Khrystal Jeanmarie D Rosalio Catterton 03/16/2020, 4:09 PM

## 2020-03-16 NOTE — Telephone Encounter (Signed)
In hospital . Second call today to request  [facility] please call them regarding CT scan results."He is the only one who can tell us the full report -the doctor here said it has to do with his cancer.Mohamed aware.

## 2020-03-16 NOTE — Progress Notes (Signed)
RN entered patient's room from a call light and noticed the patient has increased confusion since the beginning of this RN's shift. Patient had taken off O2 and sats were in the 80s. Patient was confused with the oxygen monitor beeping, did not understand the fluids running. Patient was reoriented and O2 reapplied and saturations recovered to the 90s.

## 2020-03-16 NOTE — Plan of Care (Signed)

## 2020-03-16 NOTE — Progress Notes (Addendum)
[facility] PROGRESS NOTE    Progress Note  Anthony Curry  DQQ:[id number] DOB: 20-Jun-1941 DOA: 03/13/2020 PCP: Patient, No Pcp Per     Brief Narrative:   Anthony Curry is an 79 y.o. 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  Then developed ischemic left hand, status post left brachial embolectomy and COVID-19 pneumonia.  Was discharged on March 1941 for complicated fracture.  Was back into the hospital for dizziness intermittently was getting up when he lost consciousness, twelve-lead EKG was unremarkable.  Was noted for significant hyponatremia.  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 CT scan of the chest done on 03/13/2020 showed increased pericardial effusion, previous right chest rib fracture involving the fourth sixth and seventh and eighth rib posteriorly.  Increased left effusion with bronchial wall thickening at the lung base concerned about aspiration pneumonitis. He is currently on demeclocycline and fluid restriction in his sodium is trending up nicely Place him on fluid restriction.  PeriCardial effusion: A 2D echo 03/13/2020 showed a moderate large to moderate pericardial effusion no evidence of tamponade cardiology was consulted no further work-up.  Stage 4 lung cancer, unspecified laterality ([place]) Noted in 2014, chemotherapy is on hold since December 2020.  This was held due to his weakness  cachexia and fragility. Oncology Dr. [name] has been consulted.  Right posterior rib fracture: Likely because of his back pain continue Vicodin morphine and lidocaine patch.  Urinary retention: With indwelling Foley catheter. Failed last voiding trial was scheduled to follow-up with urology on 03/24/2020. Continue Flomax follow-up with urology as an outpatient.  Chronic atrial fibrillation: continue Eliquis he is currently rate controlled  Pressure injury of skin  RN Pressure Injury Documentation: Pressure Injury 03/13/20 Coccyx Mid Stage 1 -  Intact skin with non-blanchable redness of a localized area usually over a bony prominence. quarter sized pink area over coccyx (Active)  03/13/20 1830  Location: Coccyx  Location Orientation: Mid  Staging: Stage 1 -  Intact skin with non-blanchable redness of a localized area usually over a bony prominence.  Wound Description (Comments): quarter sized pink area over coccyx  Present on Admission: Yes    Estimated body mass index is 19.28 kg/m as calculated from the following:   Height as of this encounter: 5\' 11"  (1.803 m).   Weight as of this encounter: 62.7 kg.  DVT prophylaxis: Eliquis Family Communication:none Disposition Plan/Barrier to D/C: Came from home, physical therapy evaluated the patient and recommended home health PT, once his sodium is above 130 we can probably discharge him home. Code Status:     Code Status Orders  (From admission, onward)         Start     Ordered   03/13/20 1517  Do not attempt resuscitation (DNR)  Continuous    Question Answer Comment  In the event of cardiac or respiratory ARREST Do not call a "code blue"   In the event of cardiac or respiratory ARREST Do not perform Intubation, CPR, defibrillation or ACLS   In  the event of cardiac or respiratory ARREST Use medication by any route, position, wound care, and other measures to relive pain and suffering. May use oxygen, suction and manual  treatment of airway obstruction as needed for comfort.      03/13/20 1520        Code Status History    This patient has a current code status but no historical code status.   Advance Care Planning Activity    Advance Directive Documentation     Most Recent Value  Type of Advance Directive  Out of facility DNR (pink MOST or yellow form)  Pre-existing out of facility DNR order (yellow form or pink MOST form)  Physician notified to receive inpatient order  "MOST" Form in Place?  --        IV Access:    Peripheral IV   Procedures and diagnostic studies:   DG Knee 3 Views Left  Result Date: 03/14/2020 CLINICAL DATA:  Left knee swelling, recent fall EXAM: LEFT KNEE - 3 VIEW COMPARISON:  None. FINDINGS: No evidence of acute fracture. No dislocation. Moderate to severe tricompartmental osteoarthritis, most pronounced within the lateral compartment. Chondrocalcinosis noted. Small knee joint effusion. Mild suprapatellar soft tissue swelling. IMPRESSION: 1. No acute osseous abnormality. 2. Moderate to severe tricompartmental osteoarthritis. 3. Small knee joint effusion. Mild suprapatellar soft tissue swelling. Electronically Signed   By: Davina Poke D.O.   On: 03/14/2020 15:37     Medical Consultants:    None.  Anti-Infectives:   none  Subjective:    Anthony Curry his symptoms are improved still slightly dizzy upon standing.  Objective:    Vitals:   03/15/20 0745 03/15/20 1426 03/15/20 1806 03/16/20 0742  BP: (!) 135/57  (!) 144/56 134/62  Pulse: 77  75 71  Resp: 17  16 16   Temp: 98.5 F (36.9 C)  98 F (36.7 C) 98.3 F (36.8 C)  TempSrc: Oral   Oral  SpO2: 96% 96% 97% 93%  Weight:      Height:       SpO2: 93 % O2 Flow Rate (L/min): 2 L/min   Intake/Output Summary (Last 24 hours) at 03/16/2020 0745 Last data filed at 03/16/2020 0619 Gross per 24 hour  Intake --  Output 1500 ml  Net -1500 ml   Filed Weights   03/13/20 0944 03/15/20 0500  Weight:  61.2 kg 62.7 kg    Exam: General exam: In no acute distress. Respiratory system: Good air movement and clear to auscultation. Cardiovascular system: S1 & S2 heard, RRR. No JVD.  Gastrointestinal system: Abdomen is nondistended, soft and nontender.  Central nervous system: Alert and oriented. No focal neurological deficits. Extremities: No pedal edema. Skin: No rashes, lesions or ulcers  Data Reviewed:    Labs: Basic Metabolic Panel: Recent Labs  Lab 03/13/20 1155 03/13/20 1155 03/14/20 0258 03/14/20 0258 03/15/20 0247 03/16/20 0300  NA 124*  --  125*  --  125* 129*  K 4.6   < > 4.4   < > 4.1 4.3  CL 92*  --  94*  --  97* 98  CO2 26  --  23  --  23 24  GLUCOSE 103*  --  98  --  108* 88  BUN 12  --  8  --  11 10  CREATININE 0.60*  --  0.59*  --  0.54* 0.57*  CALCIUM 8.7*  --  8.4*  --  7.9* 7.9*   < > = values in this  interval not displayed.   GFR Estimated Creatinine Clearance: 65.3 mL/min (A) (by C-G formula based on SCr of 0.57 mg/dL (L)). Liver Function Tests: Recent Labs  Lab 03/13/20 1155 03/16/20 0300  AST 30 15  ALT 19 13  ALKPHOS 124 87  BILITOT 0.7 1.0  PROT 5.4* 4.2*  ALBUMIN 2.9* 2.1*   No results for input(s): LIPASE, AMYLASE in the last 168 hours. No results for input(s): AMMONIA in the last 168 hours. Coagulation profile No results for input(s): INR, PROTIME in the last 168 hours. COVID-19 Labs  No results for input(s): DDIMER, FERRITIN, LDH, CRP in the last 72 hours.  Lab Results  Component Value Date   [place] NEGATIVE 03/13/2020    CBC: Recent Labs  Lab 03/13/20 1155 03/14/20 0258  WBC 9.3 7.8  NEUTROABS 7.2  --   HGB 11.8* 11.8*  HCT 35.6* 34.9*  MCV 86.6 85.5  PLT 395 386   Cardiac Enzymes: No results for input(s): CKTOTAL, CKMB, CKMBINDEX, TROPONINI in the last 168 hours. BNP (last 3 results) No results for input(s): PROBNP in the last 8760 hours. CBG: No results for input(s): GLUCAP in the last 168  hours. D-Dimer: No results for input(s): DDIMER in the last 72 hours. Hgb A1c: No results for input(s): HGBA1C in the last 72 hours. Lipid Profile: No results for input(s): CHOL, HDL, LDLCALC, TRIG, CHOLHDL, LDLDIRECT in the last 72 hours. Thyroid function studies: No results for input(s): TSH, T4TOTAL, T3FREE, THYROIDAB in the last 72 hours.  Invalid input(s): FREET3 Anemia work up: No results for input(s): VITAMINB12, FOLATE, FERRITIN, TIBC, IRON, RETICCTPCT in the last 72 hours. Sepsis Labs: Recent Labs  Lab 03/13/20 1155 03/14/20 0258  WBC 9.3 7.8   Microbiology Recent Results (from the past 240 hour(s))  SARS CORONAVIRUS 2 (TAT 6-24 HRS) Nasopharyngeal Nasopharyngeal Swab     Status: None   Collection Time: 03/13/20  1:51 PM   Specimen: Nasopharyngeal Swab  Result Value Ref Range Status   SARS Coronavirus 2 NEGATIVE NEGATIVE Final    Comment: (NOTE) SARS-CoV-2 target nucleic acids are NOT DETECTED. The SARS-CoV-2 RNA is generally detectable in upper and lower respiratory specimens during the acute phase of infection. Negative results do not preclude SARS-CoV-2 infection, do not rule out co-infections with other pathogens, and should not be used as the sole basis for treatment or other patient management decisions. Negative results must be combined with clinical observations, patient history, and epidemiological information. The expected result is Negative. Fact Sheet for Patients: SugarRoll.be Fact Sheet for Healthcare Providers: https://www.woods-mathews.com/ This test is not yet approved or cleared by the [place] FDA and  has been authorized for detection and/or diagnosis of SARS-CoV-2 by FDA under an Emergency Use Authorization (EUA). This EUA will remain  in effect (meaning this test can be used) for the duration of the COVID-19 declaration under Section 56 4(b)(1) of the Act, 21 U.S.C. section 360bbb-3(b)(1), unless  the authorization is terminated or revoked sooner. Performed at [facility], [place] [address] [place], [place] [address]      Medications:   . apixaban  5 mg Oral BID  . Chlorhexidine Gluconate Cloth  6 each Topical Daily  . demeclocycline  300 mg Oral BID  . dextromethorphan-guaiFENesin  1 tablet Oral BID  . diltiazem  180 mg Oral Daily  . docusate sodium  100 mg Oral BID  . levothyroxine  50 mcg Oral Q0600  . lidocaine  1 patch Transdermal Q24H  . mirtazapine  30 mg Oral QHS  . sodium chloride flush  3 mL Intravenous Q12H  . tamsulosin  0.4 mg Oral BID   Continuous Infusions: . sodium chloride 100 mL/hr at 03/15/20 2118      LOS: 2 days   Charlynne Cousins  [facility]  03/16/2020, 7:45 AM

## 2020-03-16 NOTE — Telephone Encounter (Signed)
His scan is scheduled for 03/26/2020. I do not see any scans performed during his hospitalization. So no results to discuss with her. Thank you.

## 2020-03-16 NOTE — Progress Notes (Signed)
  Speech Language Pathology Treatment: Dysphagia  Patient Details Name: Anthony Curry MRN: [id number] DOB: [birth year] Today's Date: 03/16/2020 Time: 4098-1191 SLP Time Calculation (min) (ACUTE ONLY): 10 min  Assessment / Plan / Recommendation Clinical Impression  Pt in significant pain with upright posture due to rib fx. He has a baseline congested cough that he attempts to suppress. When asked to drink something he made no attempt to reposition himself for safety. SLP demonstrated improved positioning, using tilt feature to decrease pressure on ribs. No immediate signs of aspiration were observed and pt and wife continue to deny any difficulty swallowing, which seems apparent. Pt does need improve pulmonary hygiene given decreased cough efficacy. Provided heart pillow for external pressure on rib cage during coughing. Reiterated importance of common sense aspiration precautions to reduce risk in the setting of adequate swallow function, but decreased cough ability. Pt and wife verbalized understanding. Will sign off at this time.   HPI HPI: 79 y.o. 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   Patient was previously hospitalized from 1/28-01/10/20 for perforated diverticulitis and underwent sigmoid colectomy with colostomy creation,  hospital course complicated by Covid 19 pneumonia, urinary retention requiring indwelling foley, ischemic left hand requiring left brachial embolectomy on 2/1 by vascular surgery ( Dr Oneida Alar), on Eliquis since then. He was discharged to SNF but states was not getting enough help there, felt miserable and couldn't stay there land left home the following day.  He was last hospitalized from 3/15-23 for fall resulting in hip fracture.  He was discharged to SNF.      SLP Plan  Continue with current plan of care       Recommendations  Diet recommendations: Regular;Thin liquid Liquids provided via: Cup;No straw Medication Administration: Whole meds with liquid Supervision: Patient able to self feed;Intermittent supervision to cue for compensatory strategies Compensations: Slow rate Postural Changes and/or Swallow Maneuvers: Seated upright 90 degrees                Oral Care Recommendations: Oral care BID Follow up Recommendations: Other (comment) SLP Visit Diagnosis: Dysphagia, unspecified (R13.10) Plan: Continue with current plan of care       GO               [name], MA [place] Pager [phone] Office [phone]  Lynann Beaver 03/16/2020, 12:16 PM

## 2020-03-17 ENCOUNTER — Inpatient Hospital Stay (HOSPITAL_COMMUNITY): Payer: Medicare Other

## 2020-03-17 LAB — COMPREHENSIVE METABOLIC PANEL
ALT: 17 U/L (ref 0–44)
AST: 21 U/L (ref 15–41)
Albumin: 2.2 g/dL — ABNORMAL LOW (ref 3.5–5.0)
Alkaline Phosphatase: 96 U/L (ref 38–126)
Anion gap: 7 (ref 5–15)
BUN: 11 mg/dL (ref 8–23)
CO2: 26 mmol/L (ref 22–32)
Calcium: 8.3 mg/dL — ABNORMAL LOW (ref 8.9–10.3)
Chloride: 96 mmol/L — ABNORMAL LOW (ref 98–111)
Creatinine, Ser: 0.61 mg/dL (ref 0.61–1.24)
GFR calc Af Amer: >60 mL/min (ref >60)
GFR calc non Af Amer: >60 mL/min (ref >60)
Glucose, Bld: 92 mg/dL (ref 70–99)
Potassium: 4.5 mmol/L (ref 3.5–5.1)
Sodium: 129 mmol/L — ABNORMAL LOW (ref 135–145)
Total Bilirubin: 1 mg/dL (ref 0.3–1.2)
Total Protein: 4.8 g/dL — ABNORMAL LOW (ref 6.5–8.1)

## 2020-03-17 LAB — CBC WITH DIFFERENTIAL/PLATELET
Abs Immature Granulocytes: 0.06 10*3/uL (ref 0.00–0.07)
Basophils Absolute: 0.1 10*3/uL (ref 0.0–0.1)
Basophils Relative: 1 %
Eosinophils Absolute: 0.5 10*3/uL (ref 0.0–0.5)
Eosinophils Relative: 5 %
HCT: 35.2 % — ABNORMAL LOW (ref 39.0–52.0)
Hemoglobin: 11.6 g/dL — ABNORMAL LOW (ref 13.0–17.0)
Immature Granulocytes: 1 %
Lymphocytes Relative: 15 %
Lymphs Abs: 1.5 10*3/uL (ref 0.7–4.0)
MCH: 29 pg (ref 26.0–34.0)
MCHC: 33 g/dL (ref 30.0–36.0)
MCV: 88 fL (ref 80.0–100.0)
Monocytes Absolute: 0.9 10*3/uL (ref 0.1–1.0)
Monocytes Relative: 9 %
Neutro Abs: 6.7 10*3/uL (ref 1.7–7.7)
Neutrophils Relative %: 69 %
Platelets: 318 10*3/uL (ref 150–400)
RBC: 4 MIL/uL — ABNORMAL LOW (ref 4.22–5.81)
RDW: 14.8 % (ref 11.5–15.5)
WBC: 9.7 10*3/uL (ref 4.0–10.5)
nRBC: 0 % (ref 0.0–0.2)

## 2020-03-17 MED ORDER — COLCHICINE 0.6 MG PO TABS
1.2000 mg | ORAL_TABLET | Freq: Once | ORAL | Status: AC
  Administered 2020-03-17: 1.2 mg via ORAL
  Filled 2020-03-17: qty 2

## 2020-03-17 MED ORDER — PREDNISONE 20 MG PO TABS
60.0000 mg | ORAL_TABLET | Freq: Every day | ORAL | Status: DC
  Administered 2020-03-17 – 2020-03-25 (×9): 60 mg via ORAL
  Filled 2020-03-17 (×9): qty 3

## 2020-03-17 MED ORDER — COLCHICINE 0.6 MG PO TABS
0.6000 mg | ORAL_TABLET | Freq: Two times a day (BID) | ORAL | Status: DC
  Administered 2020-03-17 – 2020-03-25 (×16): 0.6 mg via ORAL
  Filled 2020-03-17 (×16): qty 1

## 2020-03-17 MED ORDER — AMOXICILLIN-POT CLAVULANATE 875-125 MG PO TABS
1.0000 | ORAL_TABLET | Freq: Two times a day (BID) | ORAL | Status: AC
  Administered 2020-03-17 – 2020-03-23 (×13): 1 via ORAL
  Filled 2020-03-17 (×13): qty 1

## 2020-03-17 NOTE — Progress Notes (Signed)
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 Ok to dc per Dr Venetia Constable Desma Maxim, PharmD, BCIDP, AAHIVP, CPP Infectious Disease Pharmacist 03/17/2020 11:19 AM

## 2020-03-17 NOTE — Progress Notes (Addendum)
[facility] PROGRESS NOTE    Progress Note  Anthony Curry  PFX:[id number] DOB: 1941-05-17 DOA: 03/13/2020 PCP: Patient, No Pcp Per     Brief Narrative:   Anthony Curry is an 79 y.o. 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  Then developed ischemic left hand, status post left brachial embolectomy and COVID-19 pneumonia.  Was discharged on March 3299 for complicated fracture.  Was back into the hospital for dizziness intermittently was getting up when he lost consciousness, twelve-lead EKG was unremarkable.  Was noted for significant hyponatremia.  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 CT scan of the chest done on 03/13/2020 showed increased pericardial effusion, previous right chest rib fracture involving the fourth sixth and seventh and eighth rib posteriorly.  Increased left effusion with bronchial wall thickening at the lung base concerned about aspiration pneumonitis.  He was not started empirically on antibiotics as he is not dyspneic, will try to wean to room air. He is currently on demeclocycline and fluid restriction in his sodium is trending up nicely Place him on fluid restriction.  Discontinue IV fluids.  PeriCardial effusion: A 2D echo 03/13/2020 showed a moderate large to moderate pericardial effusion no evidence of tamponade cardiology was consulted no further work-up.  Stage 4 lung cancer,  unspecified laterality ([place]) Noted in 2014, chemotherapy is on hold since December 2020.  This was held due to his weakness cachexia and fragility. Oncology Dr. [name] has been consulted.  Right posterior rib fracture: Likely because of his back pain continue Vicodin morphine and lidocaine patch.  Urinary retention: With indwelling Foley catheter. Failed last voiding trial was scheduled to follow-up with urology on 03/24/2020. Continue Flomax follow-up with urology as an outpatient.  Chronic atrial fibrillation: continue Eliquis he is currently rate controlled  Swollen left knee: Osteoarthritis small effusion, it is warm to touch and tender, will ask IR to perform arthrocentesis will send for crystals and fluid.  Send fluid for analysis. We will have him start him on colchicine, and will try to avoid NSAIDs due to his Eliquis use. Radiology requested Left knee ct of left knee,  Not much fluid on plain film.  Pressure injury of skin Coccyx midline: RN Pressure Injury Documentation: Pressure Injury 03/13/20 Coccyx Mid Stage 1 -  Intact skin with non-blanchable redness of a localized area usually over a bony prominence. quarter sized pink area over coccyx (Active)  03/13/20 1830  Location: Coccyx  Location Orientation: Mid  Staging: Stage 1 -  Intact skin with non-blanchable redness of a localized area usually over a bony prominence.  Wound Description (Comments): quarter sized pink area over coccyx  Present on Admission: Yes    Estimated body mass index is 19.28 kg/m as calculated from the following:   Height as of this encounter: 5\' 11"  (1.803 m).   Weight as of this encounter: 62.7 kg.  DVT prophylaxis: Eliquis Family Communication:none Disposition Plan/Barrier to D/C: Came from home, physical therapy evaluated the patient and recommended home health PT, once his sodium  is above 130 we can probably discharge him home. Code Status:     Code Status Orders  (From admission,  onward)         Start     Ordered   03/13/20 1517  Do not attempt resuscitation (DNR)  Continuous    Question Answer Comment  In the event of cardiac or respiratory ARREST Do not call a "code blue"   In the event of cardiac or respiratory ARREST Do not perform Intubation, CPR, defibrillation or ACLS   In the event of cardiac or respiratory ARREST Use medication by any route, position, wound care, and other measures to relive pain and suffering. May use oxygen, suction and manual treatment of airway obstruction as needed for comfort.      03/13/20 1520        Code Status History    This patient has a current code status but no historical code status.   Advance Care Planning Activity    Advance Directive Documentation     Most Recent Value  Type of Advance Directive  Out of facility DNR (pink MOST or yellow form)  Pre-existing out of facility DNR order (yellow form or pink MOST form)  Physician notified to receive inpatient order  "MOST" Form in Place?  --        IV Access:    Peripheral IV   Procedures and diagnostic studies:   No results found.   Medical Consultants:    None.  Anti-Infectives:   none  Subjective:    Anthony Curry no further dizziness upon standing he is complaining now of left severe pain on the knee  Objective:    Vitals:   03/16/20 0742 03/16/20 1554 03/16/20 2349 03/17/20 0801  BP: 134/62 (!) 146/80 (!) 150/55 (!) 151/65  Pulse: 71 79 79 81  Resp: 16 16 16 18   Temp: 98.3 F (36.8 C) 99.2 F (37.3 C) 98.3 F (36.8 C) 98.6 F (37 C)  TempSrc: Oral  Oral Oral  SpO2: 93% 94% 96% 95%  Weight:      Height:       SpO2: 95 % O2 Flow Rate (L/min): 2 L/min FiO2 (%): (!) 2 %   Intake/Output Summary (Last 24 hours) at 03/17/2020 0853 Last data filed at 03/16/2020 1700 Gross per 24 hour  Intake --  Output 1000 ml  Net -1000 ml   Filed Weights   03/13/20 0944 03/15/20 0500  Weight: 61.2 kg 62.7 kg    Exam: General exam: In  no acute distress. Respiratory system: Good air movement and clear to auscultation. Cardiovascular system: S1 & S2 heard, RRR. No JVD.  Gastrointestinal system: Abdomen is nondistended, soft and nontender.  Central nervous system: Alert and oriented. No focal neurological deficits. Extremities: No pedal edema. Skin: No rashes, lesions or ulcers  Data Reviewed:    Labs: Basic Metabolic Panel: Recent Labs  Lab 03/13/20 1155 03/13/20 1155 03/14/20 0258 03/14/20 0258 03/15/20 0247 03/16/20 0300  NA 124*  --  125*  --  125* 129*  K 4.6   < > 4.4   < > 4.1 4.3  CL 92*  --  94*  --  97* 98  CO2 26  --  23  --  23 24  GLUCOSE 103*  --  98  --  108* 88  BUN 12  --  8  --  11 10  CREATININE 0.60*  --  0.59*  --  0.54* 0.57*  CALCIUM 8.7*  --  8.4*  --  7.9* 7.9*   < > = values in this interval not displayed.   GFR Estimated Creatinine Clearance: 65.3 mL/min (A) (by C-G formula based on SCr of 0.57 mg/dL (L)). Liver Function Tests: Recent Labs  Lab 03/13/20 1155 03/16/20 0300  AST 30 15  ALT 19 13  ALKPHOS 124 87  BILITOT 0.7 1.0  PROT 5.4* 4.2*  ALBUMIN 2.9* 2.1*   No results for input(s): LIPASE, AMYLASE in the last 168 hours. No results for input(s): AMMONIA in the last 168 hours. Coagulation profile No results for input(s): INR, PROTIME in the last 168 hours. COVID-19 Labs  No results for input(s): DDIMER, FERRITIN, LDH, CRP in the last 72 hours.  Lab Results  Component Value Date   [place] NEGATIVE 03/13/2020    CBC: Recent Labs  Lab 03/13/20 1155 03/14/20 0258  WBC 9.3 7.8  NEUTROABS 7.2  --   HGB 11.8* 11.8*  HCT 35.6* 34.9*  MCV 86.6 85.5  PLT 395 386   Cardiac Enzymes: No results for input(s): CKTOTAL, CKMB, CKMBINDEX, TROPONINI in the last 168 hours. BNP (last 3 results) No results for input(s): PROBNP in the last 8760 hours. CBG: No results for input(s): GLUCAP in the last 168 hours. D-Dimer: No results for input(s): DDIMER in the last  72 hours. Hgb A1c: No results for input(s): HGBA1C in the last 72 hours. Lipid Profile: No results for input(s): CHOL, HDL, LDLCALC, TRIG, CHOLHDL, LDLDIRECT in the last 72 hours. Thyroid function studies: No results for input(s): TSH, T4TOTAL, T3FREE, THYROIDAB in the last 72 hours.  Invalid input(s): FREET3 Anemia work up: No results for input(s): VITAMINB12, FOLATE, FERRITIN, TIBC, IRON, RETICCTPCT in the last 72 hours. Sepsis Labs: Recent Labs  Lab 03/13/20 1155 03/14/20 0258  WBC 9.3 7.8   Microbiology Recent Results (from the past 240 hour(s))  SARS CORONAVIRUS 2 (TAT 6-24 HRS) Nasopharyngeal Nasopharyngeal Swab     Status: None   Collection Time: 03/13/20  1:51 PM   Specimen: Nasopharyngeal Swab  Result Value Ref Range Status   SARS Coronavirus 2 NEGATIVE NEGATIVE Final    Comment: (NOTE) SARS-CoV-2 target nucleic acids are NOT DETECTED. The SARS-CoV-2 RNA is generally detectable in upper and lower respiratory specimens during the acute phase of infection. Negative results do not preclude SARS-CoV-2 infection, do not rule out co-infections with other pathogens, and should not be used as the sole basis for treatment or other patient management decisions. Negative results must be combined with clinical observations, patient history, and epidemiological information. The expected result is Negative. Fact Sheet for Patients: SugarRoll.be Fact Sheet for Healthcare Providers: https://www.woods-mathews.com/ This test is not yet approved or cleared by the [place] FDA and  has been authorized for detection and/or diagnosis of SARS-CoV-2 by FDA under an Emergency Use Authorization (EUA). This EUA will remain  in effect (meaning this test can be used) for the duration of the COVID-19 declaration under Section 56 4(b)(1) of the Act, 21 U.S.C. section 360bbb-3(b)(1), unless the authorization is terminated or revoked  sooner. Performed at [facility], [place] [address], [place], [place] [address]      Medications:   . apixaban  5 mg Oral BID  . Chlorhexidine Gluconate Cloth  6 each Topical Daily  . demeclocycline  300 mg Oral BID  . dextromethorphan-guaiFENesin  1 tablet Oral BID  . diltiazem  180 mg Oral Daily  . docusate sodium  100 mg Oral BID  . levothyroxine  50 mcg  Oral Q0600  . lidocaine  1 patch Transdermal Q24H  . mirtazapine  30 mg Oral QHS  . sodium chloride flush  3 mL Intravenous Q12H  . tamsulosin  0.4 mg Oral BID   Continuous Infusions:     LOS: 3 days   Charlynne Cousins  [facility]  03/17/2020, 8:53 AM

## 2020-03-17 NOTE — Plan of Care (Signed)

## 2020-03-17 NOTE — Progress Notes (Signed)
Physical Therapy Treatment Patient Details Name: Anthony Curry MRN: [id number] DOB: 07-21-1941 Today's Date: 03/17/2020    History of Present Illness 79 yo male with PMH including afib, pericardial effusion, HTN, hyponatremia, stage IV SCC s/p radiation, remote adrenal cancer, ETOH dependence presents to ED with fall from w/c without LOC, medical diagnoses of symptomatic hyponatremia and posterior R 4-8 rib fractures.Pt with recent hospitalization in 12/2019 for perforated diverticulitis s/p sigmoid colectomy with colostomy, development of ischemic L hand s/p L brachial embolectomy and covid 19 PNA. Again hospitalized in 02/2020 due to hip fracture.    PT Comments    Pt required mod verbal encouragement to participate in PT/OT session today, but mobilized well with min assist. Pt requires seated rest breaks after very short bouts of activity, and cuing for recovering dyspnea. Pt ambulated 5+10 ft with rest breaks as needed, VSS, with significant L knee discomfort and mild weakness corrected with use of RW. Pt reports being depressed this session, states he wants to sleep all of the time, and explained recent medical setbacks. PT and OT offered encouragement and comfort. PT to continue to follow acutely.    Follow Up Recommendations  Home health PT;Supervision/Assistance - 24 hour     Equipment Recommendations  None recommended by PT    Recommendations for Other Services       Precautions / Restrictions Precautions Precautions: Fall Precaution Comments: colostomy; weakness and decr WB tolerance LLE Restrictions Weight Bearing Restrictions: No    Mobility  Bed Mobility Overal bed mobility: Needs Assistance Bed Mobility: Supine to Sit     Supine to sit: Supervision;HOB elevated     General bed mobility comments: supevision for safety, increased time with use of bedrail and UE propping to rest prior to trunk raising.  Transfers Overall transfer level: Needs  assistance Equipment used: Rolling walker (2 wheeled) Transfers: Sit to/from Stand Sit to Stand: Min assist         General transfer comment: min assist to power up, steadying. Requires mod encouragement to initiate stand. STS x3, from EOB, and from chair in front of sink x2.  Ambulation/Gait Ambulation/Gait assistance: Min guard;+2 safety/equipment Gait Distance (Feet): 15 Feet(5+10) Assistive device: Rolling walker (2 wheeled) Gait Pattern/deviations: Antalgic;Step-to pattern;Decreased weight shift to left;Trunk flexed Gait velocity: decr   General Gait Details: Min guard for safety, verbal cuing for upright posture, offweighting with RW as needed for LLE pain. No evidence of overt L knee buckling, min instability noted but pt managed with RW well.   Stairs             Wheelchair Mobility    Modified Rankin (Stroke Patients Only)       Balance Overall balance assessment: Needs assistance;History of Falls Sitting-balance support: Feet supported;No upper extremity supported Sitting balance-Leahy Scale: Fair     Standing balance support: Bilateral upper extremity supported;During functional activity Standing balance-Leahy Scale: Poor Standing balance comment: depends on UE support for standing                            Cognition Arousal/Alertness: Awake/alert Behavior During Therapy: Anxious;Flat affect(Frustrated) Overall Cognitive Status: Impaired/Different from baseline Area of Impairment: Attention;Memory;Problem solving                   Current Attention Level: Selective Memory: Decreased short-term memory       Problem Solving: Decreased initiation;Requires verbal cues;Requires tactile cues General Comments: A&O x 4. Pt frustrated with current  functional status stating "A week ago I was walking and now look at me." Pt required mod encouragement and cues to initiate all tasks. Pt reports feeling depressed and wanting to sleep all the  time.       Exercises Other Exercises Other Exercises: Encouraged pursed lip breathing throughout.    General Comments General comments (skin integrity, edema, etc.): 1LO2 via [place], intermittent periods of time on RA with SpO2 90% and greater. DOE 2/4, recovers with seated rest.      Pertinent Vitals/Pain Pain Assessment: 0-10 Pain Score: 8  Pain Location: 7-8/10 in back and ribcage. 5/10 pain in left knee Pain Descriptors / Indicators: Grimacing;Guarding;Discomfort Pain Intervention(s): Limited activity within patient's tolerance;Monitored during session;Repositioned    Home Living                      Prior Function            PT Goals (current goals can now be found in the care plan section) Acute Rehab PT Goals Patient Stated Goal: rehab at home PT Goal Formulation: With patient/family Time For Goal Achievement: 03/29/20 Potential to Achieve Goals: Good Progress towards PT goals: Progressing toward goals    Frequency    Min 3X/week      PT Plan Current plan remains appropriate    Co-evaluation PT/OT/SLP Co-Evaluation/Treatment: Yes Reason for Co-Treatment: Complexity of the patient's impairments (multi-system involvement);Necessary to address cognition/behavior during functional activity;For patient/therapist safety;To address functional/ADL transfers PT goals addressed during session: Mobility/safety with mobility;Balance;Proper use of DME OT goals addressed during session: ADL's and self-care      AM-PAC PT "6 Clicks" Mobility   Outcome Measure  Help needed turning from your back to your side while in a flat bed without using bedrails?: A Little Help needed moving from lying on your back to sitting on the side of a flat bed without using bedrails?: A Little Help needed moving to and from a bed to a chair (including a wheelchair)?: A Little Help needed standing up from a chair using your arms (e.g., wheelchair or bedside chair)?: A Little Help  needed to walk in hospital room?: A Little Help needed climbing 3-5 steps with a railing? : Total 6 Click Score: 16    End of Session Equipment Utilized During Treatment: Oxygen;Gait belt Activity Tolerance: Patient limited by fatigue;Patient limited by pain Patient left: with call bell/phone within reach;in chair;with chair alarm set(wife in recliner, pt placed in chair position with LEs elevated due to L knee swelling) Nurse Communication: Mobility status PT Visit Diagnosis: Muscle weakness (generalized) (M62.81);History of falling (Z91.81);Pain;Difficulty in walking, not elsewhere classified (R26.2) Pain - Right/Left: Left Pain - part of body: Knee(and R ribcage at fx sites)     Time: 3335-4562 PT Time Calculation (min) (ACUTE ONLY): 36 min  Charges:  $Gait Training: 8-22 mins                    Nike Southers E, PT Acute Rehabilitation Services Pager [phone]  Office [phone]   Sadiq Mccauley D Niara Bunker 03/17/2020, 1:11 PM

## 2020-03-17 NOTE — Progress Notes (Signed)
HEMATOLOGY-ONCOLOGY PROGRESS NOTE  SUBJECTIVE: Anthony Curry was admitted to the hospital following a fall.  The patient fell in his head.  He did not remember falling.  Wife does not recall loss of consciousness.  He has been on anticoagulation.  On admission, the patient's sodium was noted to be low at 124 (he has a history of chronic hyponatremia and has been on treatment for this). Sodium today is up to 129.  CT of the head did not show any evidence of intracranial bleed.  Since he was due for restaging CT of the chest, abdomen, pelvis this was ordered as an inpatient.  Findings showed increased pericardial effusion, increasing left effusion with bronchial wall thickening in the left lung base-findings could be related to aspiration pneumonitis, increasing nodularity in the right chest which could be due to pneumonitis or worsening disease and short-term follow-up is recommended, he also has possible inflammation in the right lower lobe of the lung, small left effusion is enlarged.   Today, the patient reports that he has no appetite.  His wife states that she tries to force him to eat but he is unable to eat anymore.  He reports that he is short of breath and has increased cough.  No recent fevers or chills.  Denies chest pain.  He reports that he feels depressed.  States that his left knee is causing pain and it is also swollen.  He is really unable to put any weight on his left leg due to this.  This impairs his ability to walk.  The patient and his wife expressed concern about discharging to home and his wife is concerned that she is not going to be able to take care of him.  They seem open to consideration for SNF for rehabilitation.  REVIEW OF SYSTEMS:   Constitutional: Denies fevers and chills.  Reports a poor appetite and weight loss. Respiratory: Reports cough and shortness of breath Cardiovascular: Denies palpitation, chest discomfort Gastrointestinal:  Denies nausea, heartburn or change in  bowel habits Skin: Denies abnormal skin rashes Neurological:Denies numbness, tingling or new weaknesses Behavioral/Psych: Reports that he feels depressed Extremities: Reports left knee pain and swelling All other systems were reviewed with the patient and are negative.  I have reviewed the past medical history, past surgical history, social history and family history with the patient and they are unchanged from previous note.   PHYSICAL EXAMINATION: ECOG PERFORMANCE STATUS: 3 - Symptomatic, >50% confined to bed  Vitals:   03/16/20 2349 03/17/20 0801  BP: (!) 150/55 (!) 151/65  Pulse: 79 81  Resp: 16 18  Temp: 98.3 F (36.8 C) 98.6 F (37 C)  SpO2: 96% 95%   Filed Weights   03/13/20 0944 03/15/20 0500  Weight: 61.2 kg 62.7 kg    Intake/Output from previous day: 04/12 0701 - 04/13 0700 In: 500 [I.V.:500] Out: 1000 [Urine:1000]  GENERAL: Alert, cachectic, no distress SKIN: Abrasion to right face and bruising noted to left hand LUNGS: clear to auscultation and percussion with normal breathing effort HEART: regular rate & rhythm and no murmurs and no pedal edema ABDOMEN:abdomen soft, non-tender and normal bowel sounds Musculoskeletal: Left knee edema NEURO: alert & oriented x 3 with fluent speech, no focal motor/sensory deficits  LABORATORY DATA:  I have reviewed the data as listed CMP Latest Ref Rng & Units 03/17/2020 03/16/2020 03/15/2020  Glucose 70 - 99 mg/dL 92 88 108(H)  BUN 8 - 23 mg/dL 11 10 11   Creatinine 0.61 - 1.24 mg/dL  0.61 0.57(L) 0.54(L)  Sodium 135 - 145 mmol/L 129(L) 129(L) 125(L)  Potassium 3.5 - 5.1 mmol/L 4.5 4.3 4.1  Chloride 98 - 111 mmol/L 96(L) 98 97(L)  CO2 22 - 32 mmol/L 26 24 23   Calcium 8.9 - 10.3 mg/dL 8.3(L) 7.9(L) 7.9(L)  Total Protein 6.5 - 8.1 g/dL 4.8(L) 4.2(L) -  Total Bilirubin 0.3 - 1.2 mg/dL 1.0 1.0 -  Alkaline Phos 38 - 126 U/L 96 87 -  AST 15 - 41 U/L 21 15 -  ALT 0 - 44 U/L 17 13 -    Lab Results  Component Value Date   WBC  9.7 03/17/2020   HGB 11.6 (L) 03/17/2020   HCT 35.2 (L) 03/17/2020   MCV 88.0 03/17/2020   PLT 318 03/17/2020   NEUTROABS 6.7 03/17/2020    DG Chest 2 View  Result Date: 03/13/2020 CLINICAL DATA:  Ground level fall. Prior diagnosis of pneumonia 3-4 weeks ago with persistent cough. EXAM: CHEST - 2 VIEW COMPARISON:  02/22/2020 and 02/24/2020 chest radiograph. FINDINGS: Emphysema. Decreased density of left basilar/retrocardiac consolidative opacity. Mid to lower lung right consolidative opacities are less conspicuous. No pneumothorax. Trace left pleural effusion, less conspicuous than prior exam. Partially obscured cardiomediastinal silhouette. Aortic arch atherosclerotic calcifications. Sequela of left shoulder arthroplasty. Chronic multilevel right rib fracture deformities. Multilevel spondylosis. IMPRESSION: Decreased density of left basilar/retrocardiac consolidation. Trace left pleural effusion. Right mid to lower lung opacities are less conspicuous. Electronically Signed   By: Primitivo Gauze M.D.   On: 03/13/2020 10:41   CT Head Wo Contrast  Result Date: 03/13/2020 CLINICAL DATA:  [place]. Facial trauma. EXAM: CT HEAD WITHOUT CONTRAST CT MAXILLOFACIAL WITHOUT CONTRAST CT CERVICAL SPINE WITHOUT CONTRAST TECHNIQUE: Multidetector CT imaging of the head, cervical spine, and maxillofacial structures were performed using the standard protocol without intravenous contrast. Multiplanar CT image reconstructions of the cervical spine and maxillofacial structures were also generated. COMPARISON:  MRI brain 06/30/2019 and head CT from 2015. FINDINGS: CT HEAD FINDINGS Brain: Stable age related cerebral atrophy, ventriculomegaly and periventricular white matter disease. Scattered basal ganglia calcifications. No extra-axial fluid collections are identified. No CT findings for acute hemispheric infarction or intracranial hemorrhage. No mass lesions. The brainstem and cerebellum are normal. Vascular: Vascular  calcifications but no aneurysm or hyperdense vessels. Skull: No skull fracture or bone lesions. Other: No scalp lesions or hematoma. CT MAXILLOFACIAL FINDINGS Osseous: Exam limited by patient motion but no definite facial bone fractures are identified. The mandibular condyles are normally located. No obvious mandible fracture. Orbits: The bony orbits are intact. Of the globes are intact. Sinuses: Patchy ethmoid sinus disease and partial opacification of the right half of the sphenoid sinus. The right maxillary sinus contains a mucous retention cyst or polyp. Soft tissues: No significant soft tissue findings. CT CERVICAL SPINE FINDINGS Alignment: Normal overall alignment. There is severe degenerative cervical spondylosis with multilevel disc disease and facet disease and mild multilevel degenerative subluxations. Skull base and vertebrae: Exam limited by motion artifact but no definite acute fractures. Soft tissues and spinal canal: No prevertebral fluid or swelling. No visible canal hematoma. Disc levels: The spinal canal is patent. Mild multilevel spinal and foraminal stenosis but no large disc protrusions. Upper chest: Emphysematous changes are noted at the lung apices with dense scarring changes. Other: Carotid artery calcifications are noted. IMPRESSION: 1. Stable age related cerebral atrophy, ventriculomegaly and periventricular white matter disease. 2. No acute intracranial findings or skull fracture. 3. No definite acute facial bone fractures. 4. Severe degenerative  cervical spondylosis with multilevel disc disease and facet disease but no definite acute cervical spine fracture. Electronically Signed   By: Marijo Sanes M.D.   On: 03/13/2020 10:09   CT CHEST W CONTRAST  Addendum Date: 03/13/2020   ADDENDUM REPORT: 03/13/2020 19:50 ADDENDUM: Signs of interval left hip arthroplasty. Fluid about the arthroplasty may be postoperative given recent placement. Correlate with any clinical signs of infection.  Electronically Signed   By: Zetta Bills M.D.   On: 03/13/2020 19:50   Result Date: 03/13/2020 CLINICAL DATA:  Small cell lung cancer assess treatment response. Also experience ground level fall, on Eliquis. EXAM: CT CHEST, ABDOMEN, AND PELVIS WITH CONTRAST TECHNIQUE: Multidetector CT imaging of the chest, abdomen and pelvis was performed following the standard protocol during bolus administration of intravenous contrast. CONTRAST:  170mL OMNIPAQUE IOHEXOL 300 MG/ML  SOLN COMPARISON:  01/23/2020 FINDINGS: CT CHEST FINDINGS Cardiovascular: Calcified and noncalcified atherosclerotic plaque throughout the aorta its branches in the chest. No aneurysmal dilation of thoracic aorta. Are stable in terms of mild dilation. Enlarging pericardial effusion, 2 cm thickness along the mid cardiac contour previously approximately 1 cm. Mediastinum/Nodes: Limited assessment of thoracic inlet structures due to motion artifact. No axillary lymphadenopathy. No hilar lymphadenopathy. No mediastinal lymphadenopathy. Esophagus grossly normal. Lungs/Pleura: Bronchial wall thickening at the left hilum with signs of lower lobe bronchiectasis, this is developed since the previous study. Small left-sided effusion is enlarged. Small right effusion with basilar volume loss. Multifocal areas of ground-glass nodularity within aerated portions of the chest and areas of part solid nodules, similar to the prior study. (Image 47, series 4) 2.8 x 1.8 cm, previously 2.9 x 1.6 cm. Solid component appears similar. Ground-glass with increased soft tissue density with subpleural bandlike opacity that has developed amidst ground-glass changes in the right upper lobe (image 57, series 4) 1.7 by 0.7 cm, no solid-appearing abnormality was present in this location on the previous exam. Pure ground-glass lesion in the left upper lobe shows no solid component on today's study measuring approximately 2.4 cm in greatest axial dimension appearing more conspicuous  but unchanged in terms of size. Areas of volume loss and opacity in the right lower lobe show increasing density along the superior aspect in the superior segment of the right lower lobe (image 78, series 4) 2.5 x 1.6 cm, this area previously measured approximately 2 point 2 by 1.2 cm. Background pulmonary emphysema. Musculoskeletal: No chest wall mass. See below for full musculoskeletal details. CT ABDOMEN PELVIS FINDINGS Hepatobiliary: Small hyperdense focus in the dome of the right hemi liver approximately cm unchanged likely a small hemangioma. Low-density focus along the margin of the inferior right hemi liver anteriorly (image 63, series 7) 8 mm, unchanged as is another area of low-density in hepatic subsegment VI measuring 15 mm. Pancreas: Pancreas is normal without focal lesion or ductal dilation. Spleen: Spleen is normal size without focal lesion. Adrenals/Urinary Tract: Adrenal glands with mild thickening of the right adrenal gland unchanged. Left adrenal gland is normal. Renal enhancement is symmetric with some scarring along the lateral cortex of the right kidney. Small low-density foci in the left kidney are likely small cysts and unchanged. No hydronephrosis. Stomach/Bowel: No acute small bowel process. 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 Stool fills much of the colon. The appendix is normal. Vascular/Lymphatic: Calcified and noncalcified plaque of the abdominal aorta without aneurysm. No abdominal adenopathy. No pelvic adenopathy. Reproductive: Prostate is small. Foley catheter in the urinary bladder. Other:  Left lower quadrant colostomy. No free air. No ascites. Musculoskeletal: Signs of previous trauma to the right chest but with new right-sided rib fractures involving the right fourth, sixth, seventh and eighth ribs posteriorly. Acute fracture also of anterior right sixth rib. Sternum is intact. Costochondral elements are intact. Marked glenohumeral  degenerative changes on the right with post operative changes of left shoulder arthroplasty as before. Signs of prior rib fracture on the left as well with similar appearance to the previous study. Multilevel spinal degenerative change. IMPRESSION: 1. Increased pericardial effusion. Echocardiography may be helpful given moderate to marked increase in size since previous examination. 2. Signs of previous trauma to the right chest but with new right-sided rib fractures involving the right fourth, sixth, seventh and eighth ribs posteriorly. 3. Increasing left effusion with bronchial wall thickening in the left lung base. Findings could be related to aspiration pneumonitis and are associated with airspace disease on today's study. 4. Increasing nodularity in the right chest. Correlate with any ongoing radiotherapy. Pneumonitis or worsening of disease is also considered. Short interval follow-up may be helpful as findings are of uncertain significance in the setting of trauma with right-sided rib fractures. 5. Areas also in the right lower lobe that warrant attention on follow-up, potentially related to posttraumatic changes or inflammation though disease worsening is also considered. 6. Small left-sided effusion is enlarged. Electronically Signed: By: Zetta Bills M.D. On: 03/13/2020 17:20   CT Cervical Spine Wo Contrast  Result Date: 03/13/2020 CLINICAL DATA:  [place]. Facial trauma. EXAM: CT HEAD WITHOUT CONTRAST CT MAXILLOFACIAL WITHOUT CONTRAST CT CERVICAL SPINE WITHOUT CONTRAST TECHNIQUE: Multidetector CT imaging of the head, cervical spine, and maxillofacial structures were performed using the standard protocol without intravenous contrast. Multiplanar CT image reconstructions of the cervical spine and maxillofacial structures were also generated. COMPARISON:  MRI brain 06/30/2019 and head CT from 2015. FINDINGS: CT HEAD FINDINGS Brain: Stable age related cerebral atrophy, ventriculomegaly and periventricular  white matter disease. Scattered basal ganglia calcifications. No extra-axial fluid collections are identified. No CT findings for acute hemispheric infarction or intracranial hemorrhage. No mass lesions. The brainstem and cerebellum are normal. Vascular: Vascular calcifications but no aneurysm or hyperdense vessels. Skull: No skull fracture or bone lesions. Other: No scalp lesions or hematoma. CT MAXILLOFACIAL FINDINGS Osseous: Exam limited by patient motion but no definite facial bone fractures are identified. The mandibular condyles are normally located. No obvious mandible fracture. Orbits: The bony orbits are intact. Of the globes are intact. Sinuses: Patchy ethmoid sinus disease and partial opacification of the right half of the sphenoid sinus. The right maxillary sinus contains a mucous retention cyst or polyp. Soft tissues: No significant soft tissue findings. CT CERVICAL SPINE FINDINGS Alignment: Normal overall alignment. There is severe degenerative cervical spondylosis with multilevel disc disease and facet disease and mild multilevel degenerative subluxations. Skull base and vertebrae: Exam limited by motion artifact but no definite acute fractures. Soft tissues and spinal canal: No prevertebral fluid or swelling. No visible canal hematoma. Disc levels: The spinal canal is patent. Mild multilevel spinal and foraminal stenosis but no large disc protrusions. Upper chest: Emphysematous changes are noted at the lung apices with dense scarring changes. Other: Carotid artery calcifications are noted. IMPRESSION: 1. Stable age related cerebral atrophy, ventriculomegaly and periventricular white matter disease. 2. No acute intracranial findings or skull fracture. 3. No definite acute facial bone fractures. 4. Severe degenerative cervical spondylosis with multilevel disc disease and facet disease but no definite acute cervical spine fracture. Electronically  Signed   By: Marijo Sanes M.D.   On: 03/13/2020 10:09    CT ABDOMEN PELVIS W CONTRAST  Addendum Date: 03/13/2020   ADDENDUM REPORT: 03/13/2020 19:50 ADDENDUM: Signs of interval left hip arthroplasty. Fluid about the arthroplasty may be postoperative given recent placement. Correlate with any clinical signs of infection. Electronically Signed   By: Zetta Bills M.D.   On: 03/13/2020 19:50   Result Date: 03/13/2020 CLINICAL DATA:  Small cell lung cancer assess treatment response. Also experience ground level fall, on Eliquis. EXAM: CT CHEST, ABDOMEN, AND PELVIS WITH CONTRAST TECHNIQUE: Multidetector CT imaging of the chest, abdomen and pelvis was performed following the standard protocol during bolus administration of intravenous contrast. CONTRAST:  147mL OMNIPAQUE IOHEXOL 300 MG/ML  SOLN COMPARISON:  01/23/2020 FINDINGS: CT CHEST FINDINGS Cardiovascular: Calcified and noncalcified atherosclerotic plaque throughout the aorta its branches in the chest. No aneurysmal dilation of thoracic aorta. Are stable in terms of mild dilation. Enlarging pericardial effusion, 2 cm thickness along the mid cardiac contour previously approximately 1 cm. Mediastinum/Nodes: Limited assessment of thoracic inlet structures due to motion artifact. No axillary lymphadenopathy. No hilar lymphadenopathy. No mediastinal lymphadenopathy. Esophagus grossly normal. Lungs/Pleura: Bronchial wall thickening at the left hilum with signs of lower lobe bronchiectasis, this is developed since the previous study. Small left-sided effusion is enlarged. Small right effusion with basilar volume loss. Multifocal areas of ground-glass nodularity within aerated portions of the chest and areas of part solid nodules, similar to the prior study. (Image 47, series 4) 2.8 x 1.8 cm, previously 2.9 x 1.6 cm. Solid component appears similar. Ground-glass with increased soft tissue density with subpleural bandlike opacity that has developed amidst ground-glass changes in the right upper lobe (image 57, series 4) 1.7  by 0.7 cm, no solid-appearing abnormality was present in this location on the previous exam. Pure ground-glass lesion in the left upper lobe shows no solid component on today's study measuring approximately 2.4 cm in greatest axial dimension appearing more conspicuous but unchanged in terms of size. Areas of volume loss and opacity in the right lower lobe show increasing density along the superior aspect in the superior segment of the right lower lobe (image 78, series 4) 2.5 x 1.6 cm, this area previously measured approximately 2 point 2 by 1.2 cm. Background pulmonary emphysema. Musculoskeletal: No chest wall mass. See below for full musculoskeletal details. CT ABDOMEN PELVIS FINDINGS Hepatobiliary: Small hyperdense focus in the dome of the right hemi liver approximately cm unchanged likely a small hemangioma. Low-density focus along the margin of the inferior right hemi liver anteriorly (image 63, series 7) 8 mm, unchanged as is another area of low-density in hepatic subsegment VI measuring 15 mm. Pancreas: Pancreas is normal without focal lesion or ductal dilation. Spleen: Spleen is normal size without focal lesion. Adrenals/Urinary Tract: Adrenal glands with mild thickening of the right adrenal gland unchanged. Left adrenal gland is normal. Renal enhancement is symmetric with some scarring along the lateral cortex of the right kidney. Small low-density foci in the left kidney are likely small cysts and unchanged. No hydronephrosis. Stomach/Bowel: No acute small bowel process. 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 Stool fills much of the colon. The appendix is normal. Vascular/Lymphatic: Calcified and noncalcified plaque of the abdominal aorta without aneurysm. No abdominal adenopathy. No pelvic adenopathy. Reproductive: Prostate is small. Foley catheter in the urinary bladder. Other: Left lower quadrant colostomy. No free air. No ascites. Musculoskeletal: Signs of previous  trauma to  the right chest but with new right-sided rib fractures involving the right fourth, sixth, seventh and eighth ribs posteriorly. Acute fracture also of anterior right sixth rib. Sternum is intact. Costochondral elements are intact. Marked glenohumeral degenerative changes on the right with post operative changes of left shoulder arthroplasty as before. Signs of prior rib fracture on the left as well with similar appearance to the previous study. Multilevel spinal degenerative change. IMPRESSION: 1. Increased pericardial effusion. Echocardiography may be helpful given moderate to marked increase in size since previous examination. 2. Signs of previous trauma to the right chest but with new right-sided rib fractures involving the right fourth, sixth, seventh and eighth ribs posteriorly. 3. Increasing left effusion with bronchial wall thickening in the left lung base. Findings could be related to aspiration pneumonitis and are associated with airspace disease on today's study. 4. Increasing nodularity in the right chest. Correlate with any ongoing radiotherapy. Pneumonitis or worsening of disease is also considered. Short interval follow-up may be helpful as findings are of uncertain significance in the setting of trauma with right-sided rib fractures. 5. Areas also in the right lower lobe that warrant attention on follow-up, potentially related to posttraumatic changes or inflammation though disease worsening is also considered. 6. Small left-sided effusion is enlarged. Electronically Signed: By: Zetta Bills M.D. On: 03/13/2020 17:20   DG Hand 2 View Left  Result Date: 03/13/2020 CLINICAL DATA:  [place]. Thumb pain. EXAM: LEFT HAND - 2 VIEW COMPARISON:  None. 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 This is likely to be painful. Ordinary degenerative arthritis at the MCP joints of the thumb, index and long fingers. Mild inter phalangeal joint  osteoarthritis. IMPRESSION: 1. 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 2. Ordinary degenerative arthritis of the MCP joints of the thumb, index and long fingers. Electronically Signed   By: Nelson Chimes M.D.   On: 03/13/2020 11:42   DG HIP UNILAT WITH PELVIS 2-3 VIEWS LEFT  Result Date: 03/13/2020 CLINICAL DATA:  Pain following fall.  History of lung carcinoma EXAM: DG HIP (WITH OR WITHOUT PELVIS) 2-3V LEFT COMPARISON:  None. FINDINGS: Frontal pelvis as well as frontal and lateral left hip images were obtained. Bones are osteoporotic. 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 No acute fracture or dislocation. There is degenerative change in the lower lumbar spine. There is mild narrowing of the right hip joint. 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 No fracture or dislocation. 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 Electronically Signed   By: Lowella Grip III M.D.   On: 03/13/2020 10:35   ECHOCARDIOGRAM LIMITED  Result Date: 03/13/2020    ECHOCARDIOGRAM LIMITED REPORT   Patient Name:   Anthony Curry Date of Exam: 03/13/2020 Medical Rec #:  [id number]     Height:       71.0 in Accession #:    [id number]    Weight:       135.0 lb Date of Birth:  1941-03-22    BSA:          1.784 m Patient Age:    23 years      BP:           162/68 mmHg Patient Gender: M             HR:  97 bpm. Exam Location:  Inpatient Procedure: Limited Echo, Limited Color Doppler and Cardiac Doppler STAT ECHO Indications:    pericardial effusion  History:        Patient has no prior history of Echocardiogram examinations.                 Lung cancer; Arrythmias:Atrial Fibrillation.  Sonographer:    Johny Chess Referring Phys: Zachary  1. Moderate to large pericardial effusion.  The pericardial effusion is circumferential. Slightly larger compared to the 01/08/20 study. Effusion measures 1.6-1.8 cm adjacent to the RV in diastole depending on where measured. There is 10% respiratory variation across the MV, not significant. Partial RA diastolic indentation without full collapse. No RV collapse. IVC is not visualized. Overall findings not consistent with tamponade physiology.  2. Of note appears patient has two medical record numbers in regards to finding prior data and studies. FINDINGS  Pericardium: Effusion measures 1.6-1.8 cm adjacent to the RV in diastole depending on where measured. There is 10% respiratory variation across the MV, not significant. Partial RA diastolic collapse. No RV collapse. IVC is not visualized. Moderate to large pericardial effusion. The pericardial effusion is circumferential. Carlyle Dolly MD Electronically signed by Carlyle Dolly MD Signature Date/Time: 03/13/2020/8:58:41 PM    Final    CT Maxillofacial WO CM  Result Date: 03/13/2020 CLINICAL DATA:  [place]. Facial trauma. EXAM: CT HEAD WITHOUT CONTRAST CT MAXILLOFACIAL WITHOUT CONTRAST CT CERVICAL SPINE WITHOUT CONTRAST TECHNIQUE: Multidetector CT imaging of the head, cervical spine, and maxillofacial structures were performed using the standard protocol without intravenous contrast. Multiplanar CT image reconstructions of the cervical spine and maxillofacial structures were also generated. COMPARISON:  MRI brain 06/30/2019 and head CT from 2015. FINDINGS: CT HEAD FINDINGS Brain: Stable age related cerebral atrophy, ventriculomegaly and periventricular white matter disease. Scattered basal ganglia calcifications. No extra-axial fluid collections are identified. No CT findings for acute hemispheric infarction or intracranial hemorrhage. No mass lesions. The brainstem and cerebellum are normal. Vascular: Vascular calcifications but no aneurysm or hyperdense vessels. Skull: No skull fracture or bone lesions.  Other: No scalp lesions or hematoma. CT MAXILLOFACIAL FINDINGS Osseous: Exam limited by patient motion but no definite facial bone fractures are identified. The mandibular condyles are normally located. No obvious mandible fracture. Orbits: The bony orbits are intact. Of the globes are intact. Sinuses: Patchy ethmoid sinus disease and partial opacification of the right half of the sphenoid sinus. The right maxillary sinus contains a mucous retention cyst or polyp. Soft tissues: No significant soft tissue findings. CT CERVICAL SPINE FINDINGS Alignment: Normal overall alignment. There is severe degenerative cervical spondylosis with multilevel disc disease and facet disease and mild multilevel degenerative subluxations. Skull base and vertebrae: Exam limited by motion artifact but no definite acute fractures. Soft tissues and spinal canal: No prevertebral fluid or swelling. No visible canal hematoma. Disc levels: The spinal canal is patent. Mild multilevel spinal and foraminal stenosis but no large disc protrusions. Upper chest: Emphysematous changes are noted at the lung apices with dense scarring changes. Other: Carotid artery calcifications are noted. IMPRESSION: 1. Stable age related cerebral atrophy, ventriculomegaly and periventricular white matter disease. 2. No acute intracranial findings or skull fracture. 3. No definite acute facial bone fractures. 4. Severe degenerative cervical spondylosis with multilevel disc disease and facet disease but no definite acute cervical spine fracture. Electronically Signed   By: Marijo Sanes M.D.   On: 03/13/2020 10:09   DG Knee 3 Views Left  Result  Date: 03/14/2020 CLINICAL DATA:  Left knee swelling, recent fall EXAM: LEFT KNEE - 3 VIEW COMPARISON:  None. FINDINGS: No evidence of acute fracture. No dislocation. Moderate to severe tricompartmental osteoarthritis, most pronounced within the lateral compartment. Chondrocalcinosis noted. Small knee joint effusion. Mild  suprapatellar soft tissue swelling. IMPRESSION: 1. No acute osseous abnormality. 2. Moderate to severe tricompartmental osteoarthritis. 3. Small knee joint effusion. Mild suprapatellar soft tissue swelling. Electronically Signed   By: Davina Poke D.O.   On: 03/14/2020 15:37    ASSESSMENT AND PLAN: This is a very pleasant 79 year old Caucasian male with metastatic non-small cell lung cancer, adenocarcinoma of the right lobe.  He was diagnosed in October 2014.  He has no actionable mutations.  Most recently, he was receiving second line immunotherapy with nivolumab.  He is status post 60 cycles of nivolumab IV every 2 weeks and was then switched to nivolumab 480 mg IV every 4 weeks and is status post 31 cycles.  The patient was tolerating well without any adverse effects except for fatigue and weakness.  The patient's treatment has been on hold since December 2020 secondary to several surgeries and hospitalizations.  He has now admitted with hyponatremia.  His hyponatremia has been chronic.  Sodium this hospitalization has been slowly improving with demeclocycline.  Continue demeclocycline.  I also note that his Remeron was discontinued due to concern for worsening hyponatremia.  This has been a long-term medication for Anthony Curry and I do not think it is contributing to his acute drop in his sodium level.  We can try stopping this to see how his sodium level responds, but I am concerned about worsening depression.  We will need to monitor this closely.  The patient's CT of the chest, abdomen, pelvis have been reviewed with the patient and his wife today.  CT scan of chest concerning for pneumonitis and aspiration pneumonitis versus possible disease progression.  We discussed that scans are not definitive for disease progression.  He does report increased cough and shortness of breath today.  Recommend treating for aspiration pneumonitis and immune mediated pneumonitis.  I will start him on Augmentin  875/125 1 tablet twice daily x7 days with prednisone 1 mg/kg (60 mg).  Recommend continuing this dose for at least 1 to 2 weeks and then the prednisone will be slowly tapered.  The patient expressed frustration about not being able to walk very well due to left knee swelling and pain.  I note that he is for possible arthrocentesis later today.  Hopefully, this will improve his symptoms.  His wife is concerned about bringing him home and her ability to care for him.  We discussed consideration of SNF placement for short-term rehabilitation before going home.  The patient seems open to considering this again.  I have placed a transition of care consult.  I will arrange for outpatient follow-up at the cancer center once and for discharge has been determined.  We will have ongoing discussions regarding management of his lung cancer at his next visit and for dose reduction of his prednisone.   LOS: 3 days   [name], DNP, AGPCNP-BC, AOCNP 03/17/20

## 2020-03-17 NOTE — Progress Notes (Signed)
Occupational Therapy Treatment Patient Details Name: Anthony Curry MRN: [id number] DOB: 01-22-1941 Today's Date: 03/17/2020    History of present illness 79 yo male with PMH including afib, pericardial effusion, HTN, hyponatremia, stage IV SCC s/p radiation, remote adrenal cancer, ETOH dependence presents to ED with fall from w/c without LOC, medical diagnoses of symptomatic hyponatremia and posterior R 4-8 rib fractures.Pt with recent hospitalization in 12/2019 for perforated diverticulitis s/p sigmoid colectomy with colostomy, development of ischemic L hand s/p L brachial embolectomy and covid 19 PNA. Again hospitalized in 02/2020 due to hip fracture.   OT comments  Cotx with PT to maximize pt safety and functional performance. Pt making slow steady progress in therapy, however continues to require mod encouragement to engage in OOB tasks. Pt tolerated sitting EOB ~10 min with supervision, with pt requiring occasional reclined rest break due to back pain. Encouraged pt to complete LB dressing task as independently as possible with pt able to don/doff socks with mod assist utilizing figure four position. Pt able to ambulate short distance to bedroom sink with RW and min assist for balance and safety. Pt tolerated standing 1 x 1 min and 1 x 1.5 min with min guard while washing face and brushing teeth. Pt required 2 mod seated rest breaks due to fatigue and SOB. SpO2 maintained in 90s throughout on 1L [place]. Pt ambulated around bed to bedside chair with RW and min assist. Pt required increased time to complete all tasks as well as cues to initiate. OT will continue to follow acutely. Pt would benefit from additional rehab at a SNF, however pt and family refusing. Recommend HH OT and 24/7 assist.    Follow Up Recommendations  Home health OT;Supervision/Assistance - 24 hour(pt/pt spouse declining SNF at this time)    Equipment Recommendations  3 in 1 bedside commode    Recommendations for Other Services       Precautions / Restrictions Precautions Precautions: Fall Precaution Comments: colostomy; weakness and decr WB tolerance LLE Restrictions Weight Bearing Restrictions: No       Mobility Bed Mobility Overal bed mobility: Needs Assistance Bed Mobility: Supine to Sit     Supine to sit: Supervision;HOB elevated     General bed mobility comments: Use of bed rail. Increased time to complete. Cues to initiate.  Transfers Overall transfer level: Needs assistance Equipment used: Rolling walker (2 wheeled) Transfers: Sit to/from Stand Sit to Stand: Min assist         General transfer comment: Encouragement to initiate task. Assist for balance and safety.     Balance Overall balance assessment: Needs assistance;History of Falls Sitting-balance support: Feet supported;No upper extremity supported Sitting balance-Leahy Scale: Fair     Standing balance support: Bilateral upper extremity supported;During functional activity Standing balance-Leahy Scale: Poor                             ADL either performed or assessed with clinical judgement   ADL Overall ADL's : Needs assistance/impaired     Grooming: Min guard;Sitting;Standing;Wash/dry hands;Wash/dry face;Oral care;Brushing hair Grooming Details (indicate cue type and reason): While standing at the sink with pt requiring 2 seated rest breaks.              Lower Body Dressing: Moderate assistance;Sitting/lateral leans Lower Body Dressing Details (indicate cue type and reason): To don/doff socks while seated EOB. Pt incorporated use of figure four position. Required mod encouragement to complete as independently as possible.  Functional mobility during ADLs: Minimal assistance;Rolling walker General ADL Comments: Pt able to ambulate to bedroom sink with RW and min assist for balance. Pt tolerated standing 1 x 1 min and 1 x 1.5 min to complete grooming/hygiene tasks. Pt able to ambulate  around bed to bedside chair.      Vision       Perception     Praxis      Cognition Arousal/Alertness: Awake/alert Behavior During Therapy: Anxious;Flat affect(Frustrated) Overall Cognitive Status: Impaired/Different from baseline Area of Impairment: Attention;Memory;Problem solving                   Current Attention Level: Selective Memory: Decreased short-term memory       Problem Solving: Decreased initiation;Requires verbal cues;Requires tactile cues General Comments: A&O x 4. Pt frustrated with current functional status stating "A week ago I was walking and now look at me." Pt required mod encouragement and cues to initiate all tasks. Pt reports feeling depressed and wanting to sleep all the time.         Exercises Exercises: Other exercises Other Exercises Other Exercises: Encouraged pursed lip breathing throughout.   Shoulder Instructions       General Comments 1L [place] throughout with SpO2 maintaining in 90s.    Pertinent Vitals/ Pain       Pain Assessment: 0-10 Pain Score: 8  Pain Location: 7-8/10 in back and ribcage. 5/10 pain in left knee Pain Descriptors / Indicators: Grimacing;Guarding;Discomfort Pain Intervention(s): Limited activity within patient's tolerance;Monitored during session;Repositioned  Home Living                                          Prior Functioning/Environment              Frequency           Progress Toward Goals  OT Goals(current goals can now be found in the care plan section)  Progress towards OT goals: Progressing toward goals  ADL Goals Pt Will Perform Grooming: with supervision;sitting;standing Pt Will Perform Lower Body Bathing: with supervision;sit to/from stand Pt Will Perform Upper Body Dressing: with modified independence;sitting Pt Will Perform Lower Body Dressing: with supervision;sit to/from stand Pt Will Transfer to Toilet: with supervision;ambulating Pt Will Perform  Toileting - Clothing Manipulation and hygiene: with supervision;sit to/from stand  Plan Discharge plan remains appropriate    Co-evaluation    PT/OT/SLP Co-Evaluation/Treatment: Yes Reason for Co-Treatment: Complexity of the patient's impairments (multi-system involvement);Necessary to address cognition/behavior during functional activity;For patient/therapist safety;To address functional/ADL transfers   OT goals addressed during session: ADL's and self-care      AM-PAC OT "6 Clicks" Daily Activity     Outcome Measure   Help from another person eating meals?: None Help from another person taking care of personal grooming?: A Little Help from another person toileting, which includes using toliet, bedpan, or urinal?: A Lot Help from another person bathing (including washing, rinsing, drying)?: A Lot Help from another person to put on and taking off regular upper body clothing?: A Little Help from another person to put on and taking off regular lower body clothing?: A Lot 6 Click Score: 16    End of Session Equipment Utilized During Treatment: Gait belt;Rolling walker;Oxygen  OT Visit Diagnosis: Unsteadiness on feet (R26.81);Other abnormalities of gait and mobility (R26.89);Muscle weakness (generalized) (M62.81);History of falling (Z91.81);Pain Pain - Right/Left: Left Pain - part  of body: Knee   Activity Tolerance Patient limited by fatigue;Patient limited by pain(Self-limiting behavior)   Patient Left in chair;with call bell/phone within reach;with chair alarm set   Nurse Communication Mobility status        Time: 3159-4585 OT Time Calculation (min): 36 min  Charges: OT General Charges $OT Visit: 1 Visit OT Treatments $Self Care/Home Management : 8-22 mins  Mauri Brooklyn OTR/L [phone]   Mauri Brooklyn 03/17/2020, 11:37 AM

## 2020-03-18 DIAGNOSIS — M25462 Effusion, left knee: Secondary | ICD-10-CM

## 2020-03-18 DIAGNOSIS — M25559 Pain in unspecified hip: Secondary | ICD-10-CM

## 2020-03-18 LAB — CBC
HCT: 33.5 % — ABNORMAL LOW (ref 39.0–52.0)
Hemoglobin: 11.3 g/dL — ABNORMAL LOW (ref 13.0–17.0)
MCH: 29.2 pg (ref 26.0–34.0)
MCHC: 33.7 g/dL (ref 30.0–36.0)
MCV: 86.6 fL (ref 80.0–100.0)
Platelets: 369 10*3/uL (ref 150–400)
RBC: 3.87 MIL/uL — ABNORMAL LOW (ref 4.22–5.81)
RDW: 14.6 % (ref 11.5–15.5)
WBC: 4.8 10*3/uL (ref 4.0–10.5)
nRBC: 0 % (ref 0.0–0.2)

## 2020-03-18 LAB — BASIC METABOLIC PANEL
Anion gap: 6 (ref 5–15)
BUN: 14 mg/dL (ref 8–23)
CO2: 27 mmol/L (ref 22–32)
Calcium: 8.5 mg/dL — ABNORMAL LOW (ref 8.9–10.3)
Chloride: 95 mmol/L — ABNORMAL LOW (ref 98–111)
Creatinine, Ser: 0.57 mg/dL — ABNORMAL LOW (ref 0.61–1.24)
GFR calc Af Amer: >60 mL/min (ref >60)
GFR calc non Af Amer: >60 mL/min (ref >60)
Glucose, Bld: 144 mg/dL — ABNORMAL HIGH (ref 70–99)
Potassium: 4.7 mmol/L (ref 3.5–5.1)
Sodium: 128 mmol/L — ABNORMAL LOW (ref 135–145)

## 2020-03-18 MED ORDER — APIXABAN 5 MG PO TABS
5.0000 mg | ORAL_TABLET | Freq: Two times a day (BID) | ORAL | Status: DC
  Administered 2020-03-18 – 2020-03-25 (×14): 5 mg via ORAL
  Filled 2020-03-18 (×14): qty 1

## 2020-03-18 NOTE — Plan of Care (Signed)

## 2020-03-18 NOTE — Progress Notes (Addendum)
PROGRESS NOTE  Anthony Curry OYD:[id number] DOB: 01-26-1941 DOA: 03/13/2020 PCP: Patient, No Pcp Per   LOS: 4 days   Brief Narrative / Interim history: 79 year old male with history of chronic A. 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   Subjective / 24h Interval events: -He is extremely frustrated with his declining health in the last several months.  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 CT scan of the chest done on 03/13/2020 showed increased pericardial effusion, previous right chest rib fracture involving the fourth sixth and seventh and eighth rib posteriorly.  Increased left effusion with bronchial wall thickening at the lung base concerned about aspiration pneumonitis.  -He is currently on demeclocycline and fluid restriction in his sodium has trended up overall remained stable.  Active Problems Pericardial effusion -A 2D echo 03/13/2020 showed a moderate large to moderate pericardial effusion no evidence of tamponade cardiology was consulted no further work-up. -This is chronic, he is being followed as an outpatient as well  Stage 4 lung cancer, unspecified laterality ([place]), acute hypoxic respiratory failure due to aspiration pneumonia -Noted in 2014, chemotherapy is on hold since December 2020.  This was held due to his weakness cachexia and fragility.  Oncology consulted, [name], [place] saw patient on 4/13. -Patient with respiratory failure, imaging showed increased left effusion with bronchial wall  thickening in the left lung base, concerning for aspiration pneumonitis and underlying airspace disease.  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  Started on colchicine -d/w patient at bedside, radiology recommending holding Eliquis however he is very concerned and does not wish to do that. I explained heparin infusion and that he will not be without anticoagulant however he is adamant about not stopping Eliquis at this point. He already felt better today after colchicine / steroids and was able to walk. He wants to reassess tomorrow and if he continues to feel good may not wish to go ahead with the knee aspiration  Right posterior rib fracture -Likely because of his back pain continue Vicodin morphine and lidocaine patch.  Hypothyroidism -Continue Synthroid  Recent left femoral neck fracture -Status post left hip hemiarthroplasty by orthopedic surgery on 02/18/2020  History of perforated diverticulitis January 2021 -Status post sigmoid colectomy and colostomy creation by general surgery on 01/05/2020  Recent COVID-19 pneumonia with concern for post Covid pneumonitis -With hypoxic respiratory failure, he was hospitalized here and finished remdesivir on 2/4.  I wonder whether he has a degree of post Covid pneumonitis, agree with steroids for now  History of ischemia to left hand-s/p left subclavian axillary brachial radial ulnar embolectomy on 2/1 by vascular surgery -Currently on Eliquis  Acute Hypoxic Resp Failure due to Covid 19 Viral pneumonia:Improved-down to just 1-2 L of oxygen. Completed remdesivir on 2/4. CRP still elevated but trending down-suspect CRP elevation more from surgical issues rather than pneumonia.  Urinary  retention -With indwelling Foley catheter. -Failed last  voiding trial was scheduled to follow-up with urology on 03/24/2020. -Continue Flomax follow-up with urology as an outpatient.  Chronic atrial fibrillation -continue Eliquis he is currently rate controlled   Pressure injury of skin Coccyx midline RN Pressure Injury Documentation Pressure Injury 03/13/20 Coccyx Mid Stage 1 -  Intact skin with non-blanchable redness of a localized area usually over a bony prominence. quarter sized pink area over coccyx (Active)  03/13/20 1830  Location: Coccyx  Location Orientation: Mid  Staging: Stage 1 -  Intact skin with non-blanchable redness of a localized area usually over a bony prominence.  Wound Description (Comments): quarter sized pink area over coccyx  Present on Admission: Yes    Scheduled Meds: . amoxicillin-clavulanate  1 tablet Oral Q12H  . apixaban  5 mg Oral BID  . Chlorhexidine Gluconate Cloth  6 each Topical Daily  . colchicine  0.6 mg Oral BID  . demeclocycline  300 mg Oral BID  . dextromethorphan-guaiFENesin  1 tablet Oral BID  . diltiazem  180 mg Oral Daily  . docusate sodium  100 mg Oral BID  . levothyroxine  50 mcg Oral Q0600  . lidocaine  1 patch Transdermal Q24H  . predniSONE  60 mg Oral Q breakfast  . sodium chloride flush  3 mL Intravenous Q12H  . tamsulosin  0.4 mg Oral BID   Continuous Infusions: PRN Meds:.acetaminophen **OR** acetaminophen, ALPRAZolam, bisacodyl, hydrALAZINE, HYDROcodone-acetaminophen, ipratropium-albuterol, morphine injection, ondansetron **OR** ondansetron (ZOFRAN) IV, polyethylene glycol, zolpidem  DVT prophylaxis: On Eliquis Code Status: DNR Family Communication: d/w patient  Patient admitted from: home Anticipated d/c place: home Barriers to d/c: Persistent knee effusion with decreased mobility, awaiting arthrocentesis  Consultants:  Oncology Cardiology  Procedures:  2D echo:  IMPRESSIONS  1. Moderate to large pericardial  effusion. The pericardial effusion is circumferential. Slightly larger compared to the 01/08/20 study. Effusion measures 1.6-1.8 cm adjacent to the RV in diastole depending on where measured. There is 10% respiratory variation across the MV, not significant. Partial RA diastolic indentation without full collapse. No RV collapse. IVC is not visualized. Overall findings not consistent with tamponade physiology.  2. Of note appears patient has two medical record numbers in regards to finding prior data and studies.   Microbiology  None   Antimicrobials: Augmentin 4/13 >>    Objective: Vitals:   03/17/20 2325 03/18/20 0050 03/18/20 0100 03/18/20 0737  BP: (!) 132/57   (!) 150/59  Pulse: 78   81  Resp:    20  Temp: 98.3 F (36.8 C)   98.6 F (37 C)  TempSrc:    Oral  SpO2: 92% (!) 79% 95% 100%  Weight:      Height:       No intake or output data in the 24 hours ending 03/18/20 1204 Filed Weights   03/13/20 0944 03/15/20 0500  Weight: 61.2 kg 62.7 kg    Examination:  Constitutional: NAD Eyes: no scleral icterus ENMT: Mucous membranes are moist.  Neck: normal, supple Respiratory: Distant breath sounds, no wheezing, normal respiratory effort Cardiovascular: Regular rate and rhythm, no murmurs / rubs / gallops. No LE edema.  Abdomen: non distended, no tenderness. Bowel sounds positive.  Ostomy bag in place Musculoskeletal: no clubbing / cyanosis.  Skin: no rashes Neurologic: Nonfocal, equal strength  Data Reviewed: I have independently reviewed following labs and imaging studies   CBC: Recent Labs  Lab 03/13/20 1155 03/14/20 0258 03/17/20 0915 03/18/20 0258  WBC 9.3 7.8 9.7 4.8  NEUTROABS 7.2  --  6.7  --   HGB 11.8* 11.8* 11.6* 11.3*  HCT 35.6* 34.9* 35.2* 33.5*  MCV 86.6 85.5 88.0 86.6  PLT 395 386 318 779   Basic Metabolic Panel: Recent Labs  Lab 03/14/20 0258 03/15/20 0247 03/16/20 0300 03/17/20 0915 03/18/20 0258  NA 125* 125* 129* 129* 128*  K 4.4 4.1  4.3 4.5 4.7  CL 94* 97* 98 96* 95*  CO2 23 23 24 26 27   GLUCOSE 98 108* 88 92 144*  BUN 8 11 10 11 14   CREATININE 0.59* 0.54* 0.57* 0.61 0.57*  CALCIUM 8.4* 7.9* 7.9* 8.3* 8.5*   Liver Function Tests: Recent Labs  Lab 03/13/20 1155 03/16/20 0300 03/17/20 0915  AST 30 15 21   ALT 19 13 17   ALKPHOS 124 87 96  BILITOT 0.7 1.0 1.0  PROT 5.4* 4.2* 4.8*  ALBUMIN 2.9* 2.1* 2.2*   Coagulation Profile: No results for input(s): INR, PROTIME in the last 168 hours. HbA1C: No results for input(s): HGBA1C in the last 72 hours. CBG: No results for input(s): GLUCAP in the last 168 hours.  Recent Results (from the past 240 hour(s))  SARS CORONAVIRUS 2 (TAT 6-24 HRS) Nasopharyngeal Nasopharyngeal Swab     Status: None   Collection Time: 03/13/20  1:51 PM   Specimen: Nasopharyngeal Swab  Result Value Ref Range Status   SARS Coronavirus 2 NEGATIVE NEGATIVE Final    Comment: (NOTE) SARS-CoV-2 target nucleic acids are NOT DETECTED. The SARS-CoV-2 RNA is generally detectable in upper and lower respiratory specimens during the acute phase of infection. Negative results do not preclude SARS-CoV-2 infection, do not rule out co-infections with other pathogens, and should not be used as the sole basis for treatment or other patient management decisions. Negative results must be combined with clinical observations, patient history, and epidemiological information. The expected result is Negative. Fact Sheet for Patients: SugarRoll.be Fact Sheet for Healthcare Providers: https://www.woods-mathews.com/ This test is not yet approved or cleared by the [place] FDA and  has been authorized for detection and/or diagnosis of SARS-CoV-2 by FDA under an Emergency Use Authorization (EUA). This EUA will remain  in effect (meaning this test can be used) for the duration of the COVID-19 declaration under Section 56 4(b)(1) of the Act, 21 U.S.C. section  360bbb-3(b)(1), unless the authorization is terminated or revoked sooner. Performed at [facility], [place] [address] [place], [place] [address]      Radiology Studies: CT KNEE LEFT WO CONTRAST  Result Date: 03/17/2020 CLINICAL DATA:  Left knee pain and swelling. EXAM: CT OF THE left KNEE WITHOUT CONTRAST TECHNIQUE: Multidetector CT imaging of the left knee was performed according to the standard protocol. Multiplanar CT image reconstructions were also generated. COMPARISON:  Radiographs 03/14/2020 FINDINGS: Moderate to advanced tricompartmental degenerative changes with joint space narrowing, osteophytic spurring, subchondral cystic change and bony eburnation. There is fairly extensive chondrocalcinosis, a large joint effusion and synovial calcifications and probable loose bodies in the joint. 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 No acute bony findings or worrisome bone lesions. There is a moderate-sized Baker's cyst noted. Moderate atrophy of the musculature. No muscle mass or acute myositis. Grossly by CT the quadriceps and patellar tendons are intact. The ACL is not identified. The PCL is intact. The medial and lateral collateral ligaments are grossly intact. IMPRESSION: 1. 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 2. No acute bony  findings or worrisome bone lesions. 3. Moderate atrophy of the musculature. 4. Poor visualization of the ACL. 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 Electronically Signed   By: Marijo Sanes M.D.   On: 03/17/2020 20:03   Time spent: 40 minutes, more than 50% at bedside in the room answering questions and discussing all aspects of care  Marzetta Board, MD, PhD [facility]  Between 7 am - 7 pm I am available, please contact me via Amion or Securechat  Between 7 pm - 7 am I am not  available, please contact night coverage MD/APP via Amion

## 2020-03-18 NOTE — Progress Notes (Signed)
RN entered patient's room to fine the O2 saturations to read at 79%, patient was asleep and [place] was in place. Patient woke up and their O2 saturations went up to  93%. RN icreased O2 to 3L/min to allow the patient to maintain O2 saturations while sleeping.

## 2020-03-18 NOTE — Progress Notes (Signed)
Daily progress note  Patient sleeping at bedside upon shift change. Patient able to ambulate from bed to room door with minimal pain and discomfort with walker and assistance. Ambulation was tolerated well. Pt did not c/o of any pain after. Colostomy changed by this nurse x2. Patient having slight MASD on sacrum. Patient educated on the importance of moving Q2h.

## 2020-03-19 LAB — URINALYSIS, COMPLETE (UACMP) WITH MICROSCOPIC
Bilirubin Urine: NEGATIVE
Glucose, UA: NEGATIVE mg/dL
Ketones, ur: NEGATIVE mg/dL
Nitrite: NEGATIVE
Protein, ur: 100 mg/dL — AB
RBC / HPF: >50 RBC/hpf — ABNORMAL HIGH (ref 0–5)
Specific Gravity, Urine: 1.016 (ref 1.005–1.030)
WBC, UA: >50 WBC/hpf — ABNORMAL HIGH (ref 0–5)
pH: 7 (ref 5.0–8.0)

## 2020-03-19 LAB — MAGNESIUM: Magnesium: 1.7 mg/dL (ref 1.7–2.4)

## 2020-03-19 LAB — CBC
HCT: 32 % — ABNORMAL LOW (ref 39.0–52.0)
Hemoglobin: 10.8 g/dL — ABNORMAL LOW (ref 13.0–17.0)
MCH: 29 pg (ref 26.0–34.0)
MCHC: 33.8 g/dL (ref 30.0–36.0)
MCV: 86 fL (ref 80.0–100.0)
Platelets: 412 10*3/uL — ABNORMAL HIGH (ref 150–400)
RBC: 3.72 MIL/uL — ABNORMAL LOW (ref 4.22–5.81)
RDW: 14.5 % (ref 11.5–15.5)
WBC: 7.4 10*3/uL (ref 4.0–10.5)
nRBC: 0 % (ref 0.0–0.2)

## 2020-03-19 LAB — BASIC METABOLIC PANEL
Anion gap: 8 (ref 5–15)
BUN: 14 mg/dL (ref 8–23)
CO2: 27 mmol/L (ref 22–32)
Calcium: 8.7 mg/dL — ABNORMAL LOW (ref 8.9–10.3)
Chloride: 95 mmol/L — ABNORMAL LOW (ref 98–111)
Creatinine, Ser: 0.53 mg/dL — ABNORMAL LOW (ref 0.61–1.24)
GFR calc Af Amer: >60 mL/min (ref >60)
GFR calc non Af Amer: >60 mL/min (ref >60)
Glucose, Bld: 123 mg/dL — ABNORMAL HIGH (ref 70–99)
Potassium: 4.7 mmol/L (ref 3.5–5.1)
Sodium: 130 mmol/L — ABNORMAL LOW (ref 135–145)

## 2020-03-19 NOTE — Progress Notes (Signed)
PROGRESS NOTE  Anthony Curry HWE:[id number] DOB: [birth year] DOA: 03/13/2020 PCP: Patient, No Pcp Per   LOS: 5 days   Brief Narrative / Interim history: 79 year old male with history of chronic A. 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   Subjective / 24h Interval events: -Appreciates his knee being better, and doesn't want it tapped.  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 CT scan of the chest done on 03/13/2020 showed increased pericardial effusion, previous right chest rib fracture involving the fourth sixth and seventh and eighth rib posteriorly.  Increased left effusion with bronchial wall thickening at the lung base concerned about aspiration pneumonitis.  -He is currently on demeclocycline and fluid restriction in his sodium has trended up, 130 this morning  Active Problems Pericardial effusion -A 2D echo 03/13/2020 showed a moderate large to moderate pericardial effusion no evidence of tamponade cardiology was consulted no further work-up. -This is chronic, he is being followed as an outpatient as well  Stage 4 lung cancer, unspecified laterality ([place]), acute hypoxic respiratory failure due to aspiration pneumonia -Noted in 2014, chemotherapy is on hold since December 2020.  This was held due to his weakness cachexia and fragility.  Oncology consulted, [name], [place] saw patient on 4/13.  Of note, patient mentions to me that 70+ years ago when he was a child he was told he has TB  however never treated.  I'm not seeing evidence of that in his medical chart.  Obtain QuantiFERON -Patient with respiratory failure, imaging showed increased left effusion with bronchial wall thickening in the left lung base, concerning for aspiration pneumonitis and underlying airspace disease.  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  Started on colchicine -d/w patient at bedside, radiology recommending holding Eliquis however he is very concerned and does not wish to do that.  His knee appears to be getting better and now patient does not want any aspiration  Dysuria -He is on antibiotics but UA does have WBC.  Obtain urine cultures  Right posterior rib fracture -Likely because of his back pain continue Vicodin morphine and lidocaine patch.  Hypothyroidism -Continue Synthroid  Recent left femoral neck fracture -Status post left hip hemiarthroplasty by orthopedic surgery on 02/18/2020  History of perforated diverticulitis January 2021 -Status post sigmoid colectomy and colostomy creation by general surgery on 01/05/2020  Recent COVID-19 pneumonia with concern for post Covid pneumonitis -With hypoxic respiratory failure, he was hospitalized here and finished remdesivir on 2/4.  I wonder whether he has a degree of post Covid pneumonitis, agree with steroids for now.  History of ischemia to left hand-s/p left subclavian axillary brachial radial ulnar embolectomy on 2/1 by vascular surgery -Currently on Eliquis  Acute Hypoxic Resp Failure due to Covid 19 Viral pneumonia:Improved-down to just 1-2 L of oxygen. Completed remdesivir on 2/4. CRP still elevated but trending down-suspect CRP  elevation more  from surgical issues rather than pneumonia.  Urinary retention -With indwelling Foley catheter. -Failed last voiding trial was scheduled to follow-up with urology on 03/24/2020. -Continue Flomax follow-up with urology as an outpatient.  Chronic atrial fibrillation -continue Eliquis he is currently rate controlled   Pressure injury of skin Coccyx midline RN Pressure Injury Documentation Pressure Injury 03/13/20 Coccyx Mid Stage 1 -  Intact skin with non-blanchable redness of a localized area usually over a bony prominence. quarter sized pink area over coccyx (Active)  03/13/20 1830  Location: Coccyx  Location Orientation: Mid  Staging: Stage 1 -  Intact skin with non-blanchable redness of a localized area usually over a bony prominence.  Wound Description (Comments): quarter sized pink area over coccyx  Present on Admission: Yes    Scheduled Meds: . amoxicillin-clavulanate  1 tablet Oral Q12H  . apixaban  5 mg Oral BID  . Chlorhexidine Gluconate Cloth  6 each Topical Daily  . colchicine  0.6 mg Oral BID  . demeclocycline  300 mg Oral BID  . dextromethorphan-guaiFENesin  1 tablet Oral BID  . diltiazem  180 mg Oral Daily  . docusate sodium  100 mg Oral BID  . levothyroxine  50 mcg Oral Q0600  . lidocaine  1 patch Transdermal Q24H  . predniSONE  60 mg Oral Q breakfast  . sodium chloride flush  3 mL Intravenous Q12H  . tamsulosin  0.4 mg Oral BID   Continuous Infusions: PRN Meds:.acetaminophen **OR** acetaminophen, ALPRAZolam, bisacodyl, hydrALAZINE, HYDROcodone-acetaminophen, ipratropium-albuterol, morphine injection, ondansetron **OR** ondansetron (ZOFRAN) IV, polyethylene glycol, zolpidem  DVT prophylaxis: On Eliquis Code Status: DNR Family Communication: d/w patient  Patient admitted from: home Anticipated d/c place: home Barriers to d/c: Remains hypoxic attempting to wean off oxygen, persistent weakness with knee pain, progressing well with physical therapy, anticipate  discharge 1 to 2 days once strong enough to go home  Consultants:  Oncology Cardiology  Procedures:  2D echo:  IMPRESSIONS  1. Moderate to large pericardial effusion. The pericardial effusion is circumferential. Slightly larger compared to the 01/08/20 study. Effusion measures 1.6-1.8 cm adjacent to the RV in diastole depending on where measured. There is 10% respiratory variation across the MV, not significant. Partial RA diastolic indentation without full collapse. No RV collapse. IVC is not visualized. Overall findings not consistent with tamponade physiology.  2. Of note appears patient has two medical record numbers in regards to finding prior data and studies.   Microbiology  None   Antimicrobials: Augmentin 4/13 >>    Objective: Vitals:   03/18/20 0100 03/18/20 0737 03/18/20 2050 03/19/20 0831  BP:  (!) 150/59 (!) 147/57 128/68  Pulse:  81 70 70  Resp:  20 16 18   Temp:  98.6 F (37 C) 98.1 F (36.7 C) 97.9 F (36.6 C)  TempSrc:  Oral Oral Oral  SpO2: 95% 100% 100% 95%  Weight:      Height:        Intake/Output Summary (Last 24 hours) at 03/19/2020 1243 Last data filed at 03/19/2020 1200 Gross per 24 hour  Intake --  Output 1750 ml  Net -1750 ml   Filed Weights   03/13/20 0944 03/15/20 0500  Weight: 61.2 kg 62.7 kg    Examination:  Constitutional: No distress Eyes: No scleral icterus ENMT: Moist mucous membranes Neck: normal, supple Respiratory: Diminished breath sounds, no wheezing, normal respiratory effort Cardiovascular: Regular rate and rhythm, no murmurs, no edema Abdomen: Soft, nontender, nondistended, positive bowel sounds Musculoskeletal: no clubbing /  cyanosis.  Skin: No rashes Neurologic: Nonfocal, equal strength  Data Reviewed: I have independently reviewed following labs and imaging studies   CBC: Recent Labs  Lab 03/13/20 1155 03/14/20 0258 03/17/20 0915 03/18/20 0258 03/19/20 0455  WBC 9.3 7.8 9.7 4.8 7.4  NEUTROABS 7.2  --  6.7   --   --   HGB 11.8* 11.8* 11.6* 11.3* 10.8*  HCT 35.6* 34.9* 35.2* 33.5* 32.0*  MCV 86.6 85.5 88.0 86.6 86.0  PLT 395 386 318 369 638*   Basic Metabolic Panel: Recent Labs  Lab 03/15/20 0247 03/16/20 0300 03/17/20 0915 03/18/20 0258 03/19/20 0455  NA 125* 129* 129* 128* 130*  K 4.1 4.3 4.5 4.7 4.7  CL 97* 98 96* 95* 95*  CO2 23 24 26 27 27   GLUCOSE 108* 88 92 144* 123*  BUN 11 10 11 14 14   CREATININE 0.54* 0.57* 0.61 0.57* 0.53*  CALCIUM 7.9* 7.9* 8.3* 8.5* 8.7*  MG  --   --   --   --  1.7   Liver Function Tests: Recent Labs  Lab 03/13/20 1155 03/16/20 0300 03/17/20 0915  AST 30 15 21   ALT 19 13 17   ALKPHOS 124 87 96  BILITOT 0.7 1.0 1.0  PROT 5.4* 4.2* 4.8*  ALBUMIN 2.9* 2.1* 2.2*   Coagulation Profile: No results for input(s): INR, PROTIME in the last 168 hours. HbA1C: No results for input(s): HGBA1C in the last 72 hours. CBG: No results for input(s): GLUCAP in the last 168 hours.  Recent Results (from the past 240 hour(s))  SARS CORONAVIRUS 2 (TAT 6-24 HRS) Nasopharyngeal Nasopharyngeal Swab     Status: None   Collection Time: 03/13/20  1:51 PM   Specimen: Nasopharyngeal Swab  Result Value Ref Range Status   SARS Coronavirus 2 NEGATIVE NEGATIVE Final    Comment: (NOTE) SARS-CoV-2 target nucleic acids are NOT DETECTED. The SARS-CoV-2 RNA is generally detectable in upper and lower respiratory specimens during the acute phase of infection. Negative results do not preclude SARS-CoV-2 infection, do not rule out co-infections with other pathogens, and should not be used as the sole basis for treatment or other patient management decisions. Negative results must be combined with clinical observations, patient history, and epidemiological information. The expected result is Negative. Fact Sheet for Patients: SugarRoll.be Fact Sheet for Healthcare Providers: https://www.woods-mathews.com/ This test is not yet approved  or cleared by the [place] FDA and  has been authorized for detection and/or diagnosis of SARS-CoV-2 by FDA under an Emergency Use Authorization (EUA). This EUA will remain  in effect (meaning this test can be used) for the duration of the COVID-19 declaration under Section 56 4(b)(1) of the Act, 21 U.S.C. section 360bbb-3(b)(1), unless the authorization is terminated or revoked sooner. Performed at [facility], [place] [address] [place], [place] [address]      Radiology Studies: No results found. Time spent: 40 minutes, more than 50% at bedside in the room answering questions and discussing all aspects of care  Marzetta Board, MD, PhD [facility]  Between 7 am - 7 pm I am available, please contact me via Amion or Securechat  Between 7 pm - 7 am I am not available, please contact night coverage MD/APP via Amion

## 2020-03-19 NOTE — Progress Notes (Signed)
Physical Therapy Treatment Patient Details Name: Anthony Curry MRN: [id number] DOB: [birth year] Today's Date: 03/19/2020    History of Present Illness 79 yo male with PMH including afib, pericardial effusion, HTN, hyponatremia, stage IV SCC s/p radiation, remote adrenal cancer, ETOH dependence presents to ED with fall from w/c without LOC, medical diagnoses of symptomatic hyponatremia and posterior R 4-8 rib fractures.Pt with recent hospitalization in 12/2019 for perforated diverticulitis s/p sigmoid colectomy with colostomy, development of ischemic L hand s/p L brachial embolectomy and covid 19 PNA. Again hospitalized in 02/2020 due to hip fracture.    PT Comments    Pt needed encouragement from his wife and therapist to push himself to keep moving.  He was generally willing, but overall frustrated with each little/big event that had recently "hit" him.  Emphasis on transitions, warm up, sitting balance, sit to stand and progressing gait with AD   Follow Up Recommendations  Home health PT;Supervision/Assistance - 24 hour     Equipment Recommendations  None recommended by PT    Recommendations for Other Services       Precautions / Restrictions Precautions Precautions: Fall Precaution Comments: colostomy; weakness and decr WB tolerance LLE    Mobility  Bed Mobility Overal bed mobility: Needs Assistance Bed Mobility: Supine to Sit     Supine to sit: Supervision;HOB elevated Sit to supine: Min guard   General bed mobility comments: pt came up and forward on L elbow without significant difficulty.and no assist  Transfers Overall transfer level: Needs assistance Equipment used: Rolling walker (2 wheeled) Transfers: Sit to/from Stand Sit to Stand: Min assist;Min guard(depending on height and if surface has armrests.)         General transfer comment: cues needed for transfer safety, hand placement  Ambulation/Gait Ambulation/Gait assistance: Min guard Gait Distance  (Feet): 55 Feet(then 50 feet and finally 75 feet with rest in between) Assistive device: Rolling walker (2 wheeled) Gait Pattern/deviations: Step-through pattern;Decreased stride length Gait velocity: decr Gait velocity interpretation: <1.8 ft/sec, indicate of risk for recurrent falls General Gait Details: mildly unsteady with drift from left and right.  Quick to fatigue and need to sit immediately.   Stairs             Wheelchair Mobility    Modified Rankin (Stroke Patients Only)       Balance   Sitting-balance support: Feet supported;No upper extremity supported Sitting balance-Leahy Scale: Fair       Standing balance-Leahy Scale: Poor Standing balance comment: reliant on the AD or external support.                            Cognition Arousal/Alertness: Awake/alert Behavior During Therapy: Flat affect Overall Cognitive Status: (NT formally)                                        Exercises Other Exercises Other Exercises: warm up hip/knee flexion/ext exercise with graded resistance.    General Comments General comments (skin integrity, edema, etc.): pt's sats remained in the low 90% range during gait with HR around 104 bpm      Pertinent Vitals/Pain Pain Assessment: Faces Faces Pain Scale: Hurts little more Pain Location: back Pain Intervention(s): Monitored during session    Home Living  Prior Function            PT Goals (current goals can now be found in the care plan section) Acute Rehab PT Goals Patient Stated Goal: rehab at home PT Goal Formulation: With patient/family Time For Goal Achievement: 03/29/20 Potential to Achieve Goals: Good Progress towards PT goals: Progressing toward goals    Frequency    Min 3X/week      PT Plan Current plan remains appropriate    Co-evaluation              AM-PAC PT "6 Clicks" Mobility   Outcome Measure  Help needed turning from  your back to your side while in a flat bed without using bedrails?: A Little Help needed moving from lying on your back to sitting on the side of a flat bed without using bedrails?: A Little Help needed moving to and from a bed to a chair (including a wheelchair)?: A Little Help needed standing up from a chair using your arms (e.g., wheelchair or bedside chair)?: A Little Help needed to walk in hospital room?: A Little Help needed climbing 3-5 steps with a railing? : Total 6 Click Score: 16    End of Session       Nurse Communication: Mobility status PT Visit Diagnosis: Muscle weakness (generalized) (M62.81);Unsteadiness on feet (R26.81)     Time: 6378-5885 PT Time Calculation (min) (ACUTE ONLY): 34 min  Charges:  $Gait Training: 8-22 mins $Therapeutic Activity: 8-22 mins                     03/19/2020  Ginger Carne., PT Acute Rehabilitation Services [phone]  (pager) [phone]  (office)   Anthony Curry 03/19/2020, 2:06 PM

## 2020-03-20 ENCOUNTER — Inpatient Hospital Stay (HOSPITAL_COMMUNITY): Payer: Medicare Other

## 2020-03-20 LAB — BASIC METABOLIC PANEL
Anion gap: 9 (ref 5–15)
BUN: 13 mg/dL (ref 8–23)
CO2: 25 mmol/L (ref 22–32)
Calcium: 8.6 mg/dL — ABNORMAL LOW (ref 8.9–10.3)
Chloride: 95 mmol/L — ABNORMAL LOW (ref 98–111)
Creatinine, Ser: 0.56 mg/dL — ABNORMAL LOW (ref 0.61–1.24)
GFR calc Af Amer: >60 mL/min (ref >60)
GFR calc non Af Amer: >60 mL/min (ref >60)
Glucose, Bld: 88 mg/dL (ref 70–99)
Potassium: 4 mmol/L (ref 3.5–5.1)
Sodium: 129 mmol/L — ABNORMAL LOW (ref 135–145)

## 2020-03-20 LAB — CBC
HCT: 34 % — ABNORMAL LOW (ref 39.0–52.0)
Hemoglobin: 11.2 g/dL — ABNORMAL LOW (ref 13.0–17.0)
MCH: 28.3 pg (ref 26.0–34.0)
MCHC: 32.9 g/dL (ref 30.0–36.0)
MCV: 85.9 fL (ref 80.0–100.0)
Platelets: 419 10*3/uL — ABNORMAL HIGH (ref 150–400)
RBC: 3.96 MIL/uL — ABNORMAL LOW (ref 4.22–5.81)
RDW: 14.5 % (ref 11.5–15.5)
WBC: 8.1 10*3/uL (ref 4.0–10.5)
nRBC: 0 % (ref 0.0–0.2)

## 2020-03-20 LAB — URINE CULTURE: Culture: >=100000 — AB

## 2020-03-20 LAB — MAGNESIUM: Magnesium: 1.6 mg/dL — ABNORMAL LOW (ref 1.7–2.4)

## 2020-03-20 MED ORDER — FLUCONAZOLE 100 MG PO TABS
100.0000 mg | ORAL_TABLET | Freq: Every day | ORAL | Status: DC
  Administered 2020-03-20 – 2020-03-25 (×6): 100 mg via ORAL
  Filled 2020-03-20 (×5): qty 1

## 2020-03-20 MED ORDER — KETOROLAC TROMETHAMINE 15 MG/ML IJ SOLN
15.0000 mg | Freq: Once | INTRAMUSCULAR | Status: AC
  Administered 2020-03-22: 15 mg via INTRAVENOUS
  Filled 2020-03-20: qty 1

## 2020-03-20 MED ORDER — MAGNESIUM SULFATE 2 GM/50ML IV SOLN
2.0000 g | Freq: Once | INTRAVENOUS | Status: AC
  Administered 2020-03-20: 2 g via INTRAVENOUS
  Filled 2020-03-20: qty 50

## 2020-03-20 MED ORDER — KETOROLAC TROMETHAMINE 15 MG/ML IJ SOLN
INTRAMUSCULAR | Status: AC
  Filled 2020-03-20: qty 1

## 2020-03-20 NOTE — Evaluation (Signed)
SATURATION QUALIFICATIONS: (This note is used to comply with regulatory documentation for home oxygen)  Patient Saturations on Room Air at Rest = 94%  Patient Saturations on Room Air while Ambulating = 70%  Patient Saturations on 3 Liters of oxygen while Ambulating = 95%  Please briefly explain why patient needs home oxygen: patient's oxygen dropped below 88% while ambulating on room air.

## 2020-03-20 NOTE — Progress Notes (Signed)
OT Cancellation Note  Patient Details Name: Anthony Curry MRN: [id number] DOB: 1941/04/10   Cancelled Treatment:    Reason Eval/Treat Not Completed: (Pt eating breakfast, asked that therapist return later. )  [name] 03/20/2020, 9:04 AM  Nestor Lewandowsky, OTR/L Acute Rehabilitation Services Pager: [phone] Office: [phone]

## 2020-03-20 NOTE — Clinical Social Work Note (Signed)
Active with [facility] for home health. Will resume services at discharge. Cory aware.

## 2020-03-20 NOTE — Progress Notes (Signed)
PROGRESS NOTE  Anthony Curry FTD:[id number] DOB: Sep 19, 1941 DOA: 03/13/2020 PCP: Patient, No Pcp Per   LOS: 6 days   Brief Narrative / Interim history: 79 year old male with history of chronic A. 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 CT scan of the chest done on 03/13/2020 showed increased pericardial effusion, previous right chest rib fracture involving the fourth sixth and seventh and eighth rib posteriorly.  Increased left effusion with bronchial wall thickening at the lung base concerned about aspiration pneumonitis.  -He is currently on demeclocycline and fluid restriction in his sodium has trended up and overall has remained stable varying between 128-130  Active Problems Pericardial effusion -A 2D echo 03/13/2020 showed a moderate large to moderate pericardial effusion no evidence of tamponade cardiology was consulted no further work-up. -This is chronic, he is being followed as an outpatient as well  Yeast UTI -Patient with dysuria and discomfort, speciated yeast, start Diflucan and exchange Foley  Stage 4 lung cancer, unspecified laterality ([place]), acute hypoxic respiratory failure due to aspiration pneumonia -Noted in 2014, chemotherapy is on hold since December 2020.  This was held due to his weakness cachexia and fragility.  Oncology consulted, [name], PA saw patient on 4/13.  Of  note, patient mentions to me that 70+ years ago when he was a child he was told he has TB however never treated.  I'm not seeing evidence of that in his medical chart.  Obtain QuantiFERON -Patient with respiratory failure, imaging showed increased left effusion with bronchial wall thickening in the left lung base, concerning for aspiration pneumonitis and underlying airspace disease.  Increased nodularity in the right chest, pneumonitis versus disease worsening, small left-sided effusion.  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  Started on colchicine -d/w patient at bedside, radiology recommending holding Eliquis however he is very concerned and does not wish to do that. -His knee is getting better  Right posterior rib fracture -Likely because of his back pain continue Vicodin morphine and lidocaine patch.  Hypothyroidism -Continue Synthroid  Recent left femoral neck fracture -Status post left hip hemiarthroplasty by orthopedic surgery on 02/18/2020  History of perforated diverticulitis January 2021 -Status post sigmoid colectomy and colostomy creation by general surgery on 01/05/2020  Recent COVID-19 pneumonia with concern for post Covid pneumonitis -With hypoxic respiratory failure, he was hospitalized here and finished remdesivir on 2/4.  I wonder whether he has a degree of post Covid pneumonitis, agree with steroids for now.  History of ischemia to left hand-s/p left subclavian axillary brachial radial ulnar embolectomy on 2/1 by vascular surgery -Currently on Eliquis  Acute Hypoxic Resp Failure due to Covid 19 Viral pneumonia:Improved-down to just 1-2 L of oxygen. Completed remdesivir on 2/4. CRP still elevated but trending down-suspect CRP  elevation more from surgical issues rather than pneumonia.  Urinary retention -With indwelling Foley catheter. -Failed last voiding trial was scheduled to follow-up with urology on 03/24/2020. -Continue Flomax follow-up with urology as an outpatient.  Chronic atrial fibrillation -continue Eliquis he is currently rate controlled  Pressure injury of skin Coccyx midline, POA RN Pressure Injury Documentation Pressure Injury 03/13/20 Coccyx Mid Stage 1 -  Intact skin with non-blanchable redness of a localized area usually over a bony prominence. quarter sized pink area over coccyx (Active)  03/13/20 1830  Location: Coccyx  Location Orientation: Mid  Staging: Stage 1 -  Intact skin with non-blanchable redness of a localized area usually over a bony prominence.  Wound Description (Comments): quarter sized pink area over coccyx  Present on Admission: Yes    Scheduled Meds: . amoxicillin-clavulanate  1 tablet Oral Q12H  . apixaban  5 mg Oral BID  . Chlorhexidine Gluconate Cloth  6 each Topical Daily  . colchicine  0.6 mg Oral BID  . demeclocycline  300 mg Oral BID  . dextromethorphan-guaiFENesin  1 tablet Oral BID  . diltiazem  180 mg Oral Daily  . docusate sodium  100 mg Oral BID  . fluconazole  100 mg Oral Daily  . ketorolac  15 mg Intravenous Once  . levothyroxine  50 mcg Oral Q0600  . lidocaine  1 patch Transdermal Q24H  . predniSONE  60 mg Oral Q breakfast  . sodium chloride flush  3 mL Intravenous Q12H  . tamsulosin  0.4 mg Oral BID   Continuous Infusions: PRN Meds:.acetaminophen **OR** acetaminophen, ALPRAZolam, bisacodyl, hydrALAZINE, HYDROcodone-acetaminophen, ipratropium-albuterol, morphine injection, ondansetron **OR** ondansetron (ZOFRAN) IV, polyethylene glycol, zolpidem  DVT prophylaxis: On Eliquis Code Status: DNR Family Communication: d/w patient  Patient admitted from: home Anticipated d/c place: home Barriers to d/c: Remains hypoxic and clinically feels worse  today, worsening urinary symptoms, start Diflucan and change Foley cath.  Consultants:  Oncology Cardiology  Procedures:  2D echo:  IMPRESSIONS  1. Moderate to large pericardial effusion. The pericardial effusion is circumferential. Slightly larger compared to the 01/08/20 study. Effusion measures 1.6-1.8 cm adjacent to the RV in diastole depending on where measured. There is 10% respiratory variation across the MV, not significant. Partial RA diastolic indentation without full collapse. No RV collapse. IVC is not visualized. Overall findings not consistent with tamponade physiology.  2. Of note appears patient has two medical record numbers in regards to finding prior data and studies.   Microbiology  None   Antimicrobials: Augmentin 4/13 >>    Objective: Vitals:   03/19/20 0831 03/19/20 1827 03/19/20 2049 03/20/20 0728  BP: 128/68 (!) 134/95 (!) 100/53 (!) 145/64  Pulse: 70 77 71 64  Resp: 18 16 17 19   Temp: 97.9 F (36.6 C) 98.7 F (37.1 C) 98.1 F (36.7 C) 97.9 F (36.6 C)  TempSrc: Oral Oral Oral   SpO2: 95% 96% 94% 94%  Weight:      Height:        Intake/Output Summary (Last 24 hours) at 03/20/2020 1440 Last data filed at 03/20/2020 0900 Gross per 24 hour  Intake --  Output 3100 ml  Net -3100 ml   Filed Weights   03/13/20 0944 03/15/20 0500  Weight: 61.2 kg 62.7 kg    Examination:  Constitutional: NAD Eyes: No icterus ENMT: Moist mucous membranes Neck: normal, supple Respiratory: Diminished breath sounds, no wheezing Cardiovascular: Regular rate and rhythm, no murmurs, no edema Abdomen: Soft, NT, ND, positive bowel sounds Musculoskeletal: no  clubbing / cyanosis.  Skin: No rashes seen Neurologic: Nonfocal, equal strength  Data Reviewed: I have independently reviewed following labs and imaging studies   CBC: Recent Labs  Lab 03/14/20 0258 03/17/20 0915 03/18/20 0258 03/19/20 0455 03/20/20 0308  WBC 7.8 9.7 4.8 7.4 8.1  NEUTROABS  --  6.7  --    --   --   HGB 11.8* 11.6* 11.3* 10.8* 11.2*  HCT 34.9* 35.2* 33.5* 32.0* 34.0*  MCV 85.5 88.0 86.6 86.0 85.9  PLT 386 318 369 412* 568*   Basic Metabolic Panel: Recent Labs  Lab 03/16/20 0300 03/17/20 0915 03/18/20 0258 03/19/20 0455 03/20/20 0308  NA 129* 129* 128* 130* 129*  K 4.3 4.5 4.7 4.7 4.0  CL 98 96* 95* 95* 95*  CO2 24 26 27 27 25   GLUCOSE 88 92 144* 123* 88  BUN 10 11 14 14 13   CREATININE 0.57* 0.61 0.57* 0.53* 0.56*  CALCIUM 7.9* 8.3* 8.5* 8.7* 8.6*  MG  --   --   --  1.7 1.6*   Liver Function Tests: Recent Labs  Lab 03/16/20 0300 03/17/20 0915  AST 15 21  ALT 13 17  ALKPHOS 87 96  BILITOT 1.0 1.0  PROT 4.2* 4.8*  ALBUMIN 2.1* 2.2*   Coagulation Profile: No results for input(s): INR, PROTIME in the last 168 hours. HbA1C: No results for input(s): HGBA1C in the last 72 hours. CBG: No results for input(s): GLUCAP in the last 168 hours.  Recent Results (from the past 240 hour(s))  SARS CORONAVIRUS 2 (TAT 6-24 HRS) Nasopharyngeal Nasopharyngeal Swab     Status: None   Collection Time: 03/13/20  1:51 PM   Specimen: Nasopharyngeal Swab  Result Value Ref Range Status   SARS Coronavirus 2 NEGATIVE NEGATIVE Final    Comment: (NOTE) SARS-CoV-2 target nucleic acids are NOT DETECTED. The SARS-CoV-2 RNA is generally detectable in upper and lower respiratory specimens during the acute phase of infection. Negative results do not preclude SARS-CoV-2 infection, do not rule out co-infections with other pathogens, and should not be used as the sole basis for treatment or other patient management decisions. Negative results must be combined with clinical observations, patient history, and epidemiological information. The expected result is Negative. Fact Sheet for Patients: SugarRoll.be Fact Sheet for Healthcare Providers: https://www.woods-mathews.com/ This test is not yet approved or cleared by the [place] FDA and    has been authorized for detection and/or diagnosis of SARS-CoV-2 by FDA under an Emergency Use Authorization (EUA). This EUA will remain  in effect (meaning this test can be used) for the duration of the COVID-19 declaration under Section 56 4(b)(1) of the Act, 21 U.S.C. section 360bbb-3(b)(1), unless the authorization is terminated or revoked sooner. Performed at [facility], [place] [address], [place], [place] [address]   Culture, Urine     Status: Abnormal   Collection Time: 03/19/20 10:43 AM   Specimen: Urine, Random  Result Value Ref Range Status   Specimen Description URINE, RANDOM  Final   Special Requests   Final    NONE Performed at [facility], [place] [address] [place], [place] [address]    Culture >=100,000 COLONIES/mL YEAST (A)  Final   Report Status 03/20/2020 FINAL  Final     Radiology Studies: No results found.  Marzetta Board, MD, PhD [facility]  Between 7 am - 7 pm I am available, please contact me via Amion or [place]  Between 7 pm - 7 am I  am not available, please contact night coverage MD/APP via Amion

## 2020-03-20 NOTE — Progress Notes (Signed)
Brief Oncology Note:  Anthony Curry remains in the hospital and is not planning to discharge today. If he discharges over the weekend, please continue him on Prednisone as an outpatient. Recommend 60 mg daily for a total of 7 days (received first dose on 03/17/2020) and then decrease to 50 mg daily X 7 days. He already has an appointment at the [facility] on Wednesday, 04/01/2020 and we can continue to adjust this dose at that appointment.  The patient has a second medical record number ([id number]) and his appointment date and time can be located under this medical record.  [name], DNP, AGPCNP-BC, AOCNP Mon/Tues/Thurs/Fri 7am-5pm; Off Wednesdays Cell: [phone]

## 2020-03-20 NOTE — TOC Initial Note (Signed)
Transition of Care [facility]) - Initial/Assessment Note    Patient Details  Name: Anthony Curry MRN: [id number] Date of Birth: Jul 06, 1941  Transition of [facility]) CM/SW Contact:    Atilano Median, LCSW Phone Number: 03/20/2020, 2:09 PM  Clinical Narrative:                  Admitted with medical history significant of afib on Eliquis; pericardial effusion; HTN; hyponatremia; stage 4 small cell lung CA s/p radiation therapy; remote adrenal cancer; and remote ETOH dependence presenting with a fall from his wheelchair.     CSW met with patient and his wife Anthony Curry to discuss dispo plan. Both patient and his wife have concerns about patient returning home without 24 hour assistance. Anthony Curry inquiring about getting someone to sit with the patient day and night. CSW provided education and home health versus sitter services.   Patient reports that he is connected to the [facility] and gets his primary care there. Anthony Curry provided CSW with [facility] Anthony Curry [phone] ext [phone]. CSW called and left message for Anthony Curry.   Patient now with requirement for oxygen. However, patient and wife do not feel that they are able to discharge home today. Unit RN aware. TOC will continue to follow for dispo planning.  Expected Discharge Plan: [place] Barriers to Discharge: Family Issues, Continued Medical Work up, Unsafe home situation   Patient Goals and CMS Choice   CMS Medicare.gov Compare Post Acute Care list provided to:: Patient Choice offered to / list presented to : Patient  Expected Discharge Plan and Services Expected Discharge Plan: [place] In-house Referral: Clinical Social Work   Post Acute Care Choice: [place] arrangements for the past 2 months: [place]: PT, OT, RN, Nurse's Aide HH Agency: [place] Date [facility] Agency Contacted: 03/20/20 Time HH Agency  Contacted: 76 Representative spoke with at [place]: Tommi Rumps  Prior Living Arrangements/Services Living arrangements for the past 2 months: [place] with:: Spouse                   Activities of Daily Living Home Assistive Devices/Equipment: None ADL Screening (condition at time of admission) Patient's cognitive ability adequate to safely complete daily activities?: Yes Is the patient deaf or have difficulty hearing?: No Does the patient have difficulty seeing, even when wearing glasses/contacts?: No Does the patient have difficulty concentrating, remembering, or making decisions?: No Patient able to express need for assistance with ADLs?: Yes Does the patient have difficulty dressing or bathing?: Yes Independently performs ADLs?: No Does the patient have difficulty walking or climbing stairs?: Yes Weakness of Legs: Both Weakness of Arms/Hands: Both  Permission Sought/Granted                  Emotional Assessment       Orientation: : Oriented to Self, Oriented to Place, Oriented to  Time, Oriented to Situation      Admission diagnosis:  Cough [R05] Hyponatremia [E87.1] Hip pain [M25.559] Patient Active Problem List   Diagnosis Date Noted  . Pressure injury of skin 03/16/2020  . Hyponatremia 03/13/2020  . Fall (on)(from) sidewalk curb, initial encounter 03/13/2020  . Multiple fractures of rib involving four or more ribs 03/13/2020  . Diverticulitis 03/13/2020  .  Hypothyroidism (acquired) 03/13/2020  . Stage 4 lung cancer, unspecified laterality ([place])   . Pericardial effusion   . Atrial fibrillation, chronic ([place])    PCP:  Patient, No Pcp Per Pharmacy:   [facility] [place], [place] [address] Phone: [phone] Fax: [phone]     Social Determinants of Health (SDOH) Interventions    Readmission Risk Interventions No  flowsheet data found.

## 2020-03-20 NOTE — Plan of Care (Signed)

## 2020-03-20 NOTE — Progress Notes (Signed)
Occupational Therapy Treatment Patient Details Name: Anthony Curry MRN: [id number] DOB: 05/28/1941 Today's Date: 03/20/2020    History of present illness 79 yo male with PMH including afib, pericardial effusion, HTN, hyponatremia, stage IV SCC s/p radiation, remote adrenal cancer, ETOH dependence presents to ED with fall from w/c without LOC, medical diagnoses of symptomatic hyponatremia and posterior R 4-8 rib fractures.Pt with recent hospitalization in 12/2019 for perforated diverticulitis s/p sigmoid colectomy with colostomy, development of ischemic L hand s/p L brachial embolectomy and covid 19 PNA. Again hospitalized in 02/2020 due to hip fracture.   OT comments  Pt eager to walk, but frustrated by decreased endurance and dyspnea. Ambulated in hall with min guard assist and RW on RA. Upon return to room, placed back on pulse ox with reading of 86%, but with a poor wave form, rebounded within one minute to 94%. RN and MD notified.  Pt demonstrated ability to dress with set up prior walking. Educated in energy conservation, pacing, pursed lip breathing at end of session.   Follow Up Recommendations  Home health OT;Supervision/Assistance - 24 hour    Equipment Recommendations  3 in 1 bedside commode    Recommendations for Other Services      Precautions / Restrictions Precautions Precautions: Fall Precaution Comments: colostomy; foley       Mobility Bed Mobility Overal bed mobility: Modified Independent                Transfers Overall transfer level: Needs assistance Equipment used: Rolling walker (2 wheeled) Transfers: Sit to/from Stand Sit to Stand: Min guard         General transfer comment: good technique    Balance     Sitting balance-Leahy Scale: Good       Standing balance-Leahy Scale: Poor                             ADL either performed or assessed with clinical judgement   ADL Overall ADL's : Needs assistance/impaired                  Upper Body Dressing : Set up;Sitting   Lower Body Dressing: Set up;Sitting/lateral leans               Functional mobility during ADLs: Min guard;Rolling walker General ADL Comments: pt fatigues with ambulation needing standing rest breaks on RA     Vision       Perception     Praxis      Cognition Arousal/Alertness: Awake/alert Behavior During Therapy: Flat affect Overall Cognitive Status: Impaired/Different from baseline Area of Impairment: Safety/judgement;Problem solving                         Safety/Judgement: Decreased awareness of safety;Decreased awareness of deficits   Problem Solving: Decreased initiation;Requires verbal cues;Requires tactile cues General Comments: pt with concerns about his level of fatigue adn SOB        Exercises     Shoulder Instructions       General Comments      Pertinent Vitals/ Pain       Pain Assessment: No/denies pain  Home Living                                          Prior Functioning/Environment  Frequency  Min 2X/week        Progress Toward Goals  OT Goals(current goals can now be found in the care plan section)  Progress towards OT goals: Progressing toward goals  Acute Rehab OT Goals Patient Stated Goal: rehab at home OT Goal Formulation: With patient/family Time For Goal Achievement: 03/29/20 Potential to Achieve Goals: Good  Plan Discharge plan remains appropriate    Co-evaluation                 AM-PAC OT "6 Clicks" Daily Activity     Outcome Measure   Help from another person eating meals?: None Help from another person taking care of personal grooming?: A Little Help from another person toileting, which includes using toliet, bedpan, or urinal?: None Help from another person bathing (including washing, rinsing, drying)?: A Little Help from another person to put on and taking off regular upper body clothing?: A Little Help  from another person to put on and taking off regular lower body clothing?: A Little 6 Click Score: 20    End of Session Equipment Utilized During Treatment: Gait belt;Rolling walker  OT Visit Diagnosis: Unsteadiness on feet (R26.81);Other abnormalities of gait and mobility (R26.89);Muscle weakness (generalized) (M62.81);History of falling (Z91.81)   Activity Tolerance Patient limited by fatigue   Patient Left in bed;with call bell/phone within reach;with nursing/sitter in room;with family/visitor present   Nurse Communication Mobility status        Time: 3536-1443 OT Time Calculation (min): 23 min  Charges: OT General Charges $OT Visit: 1 Visit OT Treatments $Self Care/Home Management : 23-37 mins  Nestor Lewandowsky, OTR/L Acute Rehabilitation Services Pager: [phone] Office: [phone]   [name] 03/20/2020, 10:42 AM

## 2020-03-21 LAB — BASIC METABOLIC PANEL
Anion gap: 4 — ABNORMAL LOW (ref 5–15)
BUN: 15 mg/dL (ref 8–23)
CO2: 29 mmol/L (ref 22–32)
Calcium: 8.4 mg/dL — ABNORMAL LOW (ref 8.9–10.3)
Chloride: 97 mmol/L — ABNORMAL LOW (ref 98–111)
Creatinine, Ser: 0.59 mg/dL — ABNORMAL LOW (ref 0.61–1.24)
GFR calc Af Amer: >60 mL/min (ref >60)
GFR calc non Af Amer: >60 mL/min (ref >60)
Glucose, Bld: 81 mg/dL (ref 70–99)
Potassium: 3.8 mmol/L (ref 3.5–5.1)
Sodium: 130 mmol/L — ABNORMAL LOW (ref 135–145)

## 2020-03-21 LAB — QUANTIFERON-TB GOLD PLUS (RQFGPL)
QuantiFERON Mitogen Value: 3.66 IU/mL
QuantiFERON Nil Value: 0.04 IU/mL
QuantiFERON TB1 Ag Value: 0.05 IU/mL
QuantiFERON TB2 Ag Value: 0.03 IU/mL

## 2020-03-21 LAB — QUANTIFERON-TB GOLD PLUS: QuantiFERON-TB Gold Plus: NEGATIVE

## 2020-03-21 NOTE — Progress Notes (Signed)
PROGRESS NOTE  Anthony Curry PPI:[id number] DOB: 1941-07-17 DOA: 03/13/2020 PCP: Patient, No Pcp Per   LOS: 7 days   Brief Narrative / Interim history: 79 year old male with history of chronic A. 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 CT scan of the chest done on 03/13/2020 showed increased pericardial effusion, previous right chest rib fracture involving the fourth sixth and seventh and eighth rib posteriorly.  Increased left effusion with bronchial wall thickening at the lung base concerned about aspiration pneumonitis.  -He is currently on demeclocycline and fluid restriction in his sodium has trended up and overall has remained stable varying between 128-130 130 this morning  Active Problems Deconditioning -Initially physical therapy recommended SNF however he progressed nicely and now recommending home health PT.  Patient is interested in inpatient rehab and I think he is a candidate for that given high motivation as well as ability to work with physical therapy or high intensity.  I have placed a consult, discussed with admission coordinator, they will review the case, will let me know on Monday  Pericardial effusion -A 2D echo 03/13/2020 showed a moderate large to moderate pericardial effusion no evidence of tamponade cardiology was consulted no further work-up. -This is chronic, he  is being followed as an outpatient as well  Yeast UTI -Patient with dysuria and discomfort, speciated yeast, he was started on fluconazole on 4/16, Foley catheter was also exchanged on 4/16.  Plan for 7 days of antifungals.  Stage 4 lung cancer, unspecified laterality ([place]), acute hypoxic respiratory failure due to aspiration pneumonia as well as post Covid pneumonitis versus lung cancer progression -Noted in 2014, chemotherapy is on hold since December 2020.  This was held due to his weakness cachexia and fragility.  Oncology consulted, [name], PA saw patient on 4/13.  Of note, patient mentions to me that 70+ years ago when he was a child he was told he has TB however never treated.  I'm not seeing evidence of that in his medical chart.  Obtain QuantiFERON -Patient with respiratory failure, imaging showed increased left effusion with bronchial wall thickening in the left lung base, concerning for aspiration pneumonitis and underlying airspace disease.  Increased nodularity in the right chest, pneumonitis versus disease worsening, small left-sided effusion.  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  Started on colchicine -d/w patient at bedside, radiology recommending holding Eliquis however he is very concerned and does not wish to do that. -Knee is better, able to work much better with physical therapy  Right posterior rib fracture -Likely because of his back pain continue Vicodin morphine and lidocaine patch.  Hypothyroidism -Continue Synthroid  Recent left femoral neck fracture -Status post left hip hemiarthroplasty by orthopedic surgery on 02/18/2020  History  of perforated diverticulitis January 2021 -Status post sigmoid colectomy and  colostomy creation by general surgery on 01/05/2020  Recent COVID-19 pneumonia with concern for post Covid pneumonitis -With hypoxic respiratory failure, he was hospitalized here and finished remdesivir on 2/4.  I wonder whether he has a degree of post Covid pneumonitis, agree with steroids for now.  History of ischemia to left hand-s/p left subclavian axillary brachial radial ulnar embolectomy on 2/1 by vascular surgery -Currently on Eliquis  Acute Hypoxic Resp Failure due to Covid 19 Viral pneumonia:Improved-down to just 1-2 L of oxygen. Completed remdesivir on 2/4. CRP still elevated but trending down-suspect CRP elevation more from surgical issues rather than pneumonia.  Urinary retention -With indwelling Foley catheter. -Failed last voiding trial was scheduled to follow-up with urology on 03/24/2020. -Continue Flomax follow-up with urology as an outpatient.  Chronic atrial fibrillation -continue Eliquis he is currently rate controlled  Pressure injury of skin Coccyx midline, POA RN Pressure Injury Documentation Pressure Injury 03/13/20 Coccyx Mid Stage 1 -  Intact skin with non-blanchable redness of a localized area usually over a bony prominence. quarter sized pink area over coccyx (Active)  03/13/20 1830  Location: Coccyx  Location Orientation: Mid  Staging: Stage 1 -  Intact skin with non-blanchable redness of a localized area usually over a bony prominence.  Wound Description (Comments): quarter sized pink area over coccyx  Present on Admission: Yes    Scheduled Meds: . amoxicillin-clavulanate  1 tablet Oral Q12H  . apixaban  5 mg Oral BID  . Chlorhexidine Gluconate Cloth  6 each Topical Daily  . colchicine  0.6 mg Oral BID  . demeclocycline  300 mg Oral BID  . dextromethorphan-guaiFENesin  1 tablet Oral BID  . diltiazem  180 mg Oral Daily  . docusate sodium  100 mg Oral BID  . fluconazole  100 mg Oral Daily  . ketorolac  15 mg Intravenous Once  . levothyroxine  50  mcg Oral Q0600  . lidocaine  1 patch Transdermal Q24H  . predniSONE  60 mg Oral Q breakfast  . sodium chloride flush  3 mL Intravenous Q12H  . tamsulosin  0.4 mg Oral BID   Continuous Infusions: PRN Meds:.acetaminophen **OR** acetaminophen, ALPRAZolam, bisacodyl, hydrALAZINE, HYDROcodone-acetaminophen, ipratropium-albuterol, morphine injection, ondansetron **OR** ondansetron (ZOFRAN) IV, polyethylene glycol, zolpidem  DVT prophylaxis: On Eliquis Code Status: DNR Family Communication: d/w patient  Patient admitted from: home Anticipated d/c place: home Barriers to d/c: Appears to be a good candidate for CIR, consulted, awaiting decision  Consultants:  Oncology Cardiology  Procedures:  2D echo:  IMPRESSIONS  1. Moderate to large pericardial effusion. The pericardial effusion is circumferential. Slightly larger compared to the 01/08/20 study. Effusion measures 1.6-1.8 cm adjacent to the RV in diastole depending on where measured. There is 10% respiratory variation across the MV, not significant. Partial RA diastolic indentation without full collapse. No RV collapse. IVC is not visualized. Overall findings not consistent with tamponade physiology.  2. Of note appears patient has two medical record numbers in regards to finding prior data and studies.   Microbiology  None   Antimicrobials: Augmentin 4/13 >>    Objective: Vitals:   03/20/20 0728 03/20/20 1542 03/20/20 2105 03/21/20 0800  BP: (!) 145/64 118/77 135/71 (!) 144/54  Pulse: 64 81 80 69  Resp: 19 18 14 18   Temp: 97.9 F (36.6 C) 98.1 F (36.7 C) 98.2 F (36.8 C) 98.1 F (36.7 C)  TempSrc:   Oral   SpO2: 94%  95%  93%  Weight:      Height:        Intake/Output Summary (Last 24 hours) at 03/21/2020 1156 Last data filed at 03/21/2020 0800 Gross per 24 hour  Intake 3 ml  Output 750 ml  Net -747 ml   Filed Weights   03/13/20 0944 03/15/20 0500  Weight: 61.2 kg 62.7 kg    Examination:  Constitutional: No  distress Eyes: No scleral icterus ENMT: Moist mucous membranes Neck: normal, supple Respiratory: Diminished at the bases but no wheezing, faint rhonchi Cardiovascular: Regular rate and rhythm, no murmurs, no edema Abdomen: Soft, nontender, nondistended, positive bowel sounds Musculoskeletal: no clubbing / cyanosis.  Skin: No rashes seen Neurologic: No focal deficits, equal strength  Data Reviewed: I have independently reviewed following labs and imaging studies   CBC: Recent Labs  Lab 03/17/20 0915 03/18/20 0258 03/19/20 0455 03/20/20 0308  WBC 9.7 4.8 7.4 8.1  NEUTROABS 6.7  --   --   --   HGB 11.6* 11.3* 10.8* 11.2*  HCT 35.2* 33.5* 32.0* 34.0*  MCV 88.0 86.6 86.0 85.9  PLT 318 369 412* 098*   Basic Metabolic Panel: Recent Labs  Lab 03/17/20 0915 03/18/20 0258 03/19/20 0455 03/20/20 0308 03/21/20 0510  NA 129* 128* 130* 129* 130*  K 4.5 4.7 4.7 4.0 3.8  CL 96* 95* 95* 95* 97*  CO2 26 27 27 25 29   GLUCOSE 92 144* 123* 88 81  BUN 11 14 14 13 15   CREATININE 0.61 0.57* 0.53* 0.56* 0.59*  CALCIUM 8.3* 8.5* 8.7* 8.6* 8.4*  MG  --   --  1.7 1.6*  --    Liver Function Tests: Recent Labs  Lab 03/16/20 0300 03/17/20 0915  AST 15 21  ALT 13 17  ALKPHOS 87 96  BILITOT 1.0 1.0  PROT 4.2* 4.8*  ALBUMIN 2.1* 2.2*   Coagulation Profile: No results for input(s): INR, PROTIME in the last 168 hours. HbA1C: No results for input(s): HGBA1C in the last 72 hours. CBG: No results for input(s): GLUCAP in the last 168 hours.  Recent Results (from the past 240 hour(s))  SARS CORONAVIRUS 2 (TAT 6-24 HRS) Nasopharyngeal Nasopharyngeal Swab     Status: None   Collection Time: 03/13/20  1:51 PM   Specimen: Nasopharyngeal Swab  Result Value Ref Range Status   SARS Coronavirus 2 NEGATIVE NEGATIVE Final    Comment: (NOTE) SARS-CoV-2 target nucleic acids are NOT DETECTED. The SARS-CoV-2 RNA is generally detectable in upper and lower respiratory specimens during the acute phase  of infection. Negative results do not preclude SARS-CoV-2 infection, do not rule out co-infections with other pathogens, and should not be used as the sole basis for treatment or other patient management decisions. Negative results must be combined with clinical observations, patient history, and epidemiological information. The expected result is Negative. Fact Sheet for Patients: SugarRoll.be Fact Sheet for Healthcare Providers: https://www.woods-mathews.com/ This test is not yet approved or cleared by the [place] FDA and  has been authorized for detection and/or diagnosis of SARS-CoV-2 by FDA under an Emergency Use Authorization (EUA). This EUA will remain  in effect (meaning this test can be used) for the duration of the COVID-19 declaration under Section 56 4(b)(1) of the Act, 21 U.S.C. section 360bbb-3(b)(1), unless the authorization is terminated or revoked sooner. Performed at [facility], [place] [address], [place], [place] [address]   Culture, Urine     Status: Abnormal   Collection Time: 03/19/20 10:43 AM  Specimen: Urine, Random  Result Value Ref Range Status   Specimen Description URINE, RANDOM  Final   Special Requests   Final    NONE Performed at [facility], [address], [place], [place] [address]    Culture >=100,000 COLONIES/mL YEAST (A)  Final   Report Status 03/20/2020 FINAL  Final     Radiology Studies: DG CHEST PORT 1 VIEW  Result Date: 03/20/2020 CLINICAL DATA:  Dyspnea EXAM: PORTABLE CHEST 1 VIEW COMPARISON:  03/14/2020 FINDINGS: Cardiac shadow is stable. Aortic calcifications are again seen. Persistent and increased left retrocardiac density is noted. No sizable effusion is noted. Old rib fractures are noted on the right. No definitive acute infiltrate is noted. Mild emphysematous changes are noted. IMPRESSION: Increased left retrocardiac density noted. Electronically Signed   By: Inez Catalina  M.D.   On: 03/20/2020 15:04    Marzetta Board, MD, PhD [facility]  Between 7 am - 7 pm I am available, please contact me via Amion or Securechat  Between 7 pm - 7 am I am not available, please contact night coverage MD/APP via Amion

## 2020-03-22 LAB — BASIC METABOLIC PANEL
Anion gap: 3 — ABNORMAL LOW (ref 5–15)
BUN: 15 mg/dL (ref 8–23)
CO2: 30 mmol/L (ref 22–32)
Calcium: 8.5 mg/dL — ABNORMAL LOW (ref 8.9–10.3)
Chloride: 96 mmol/L — ABNORMAL LOW (ref 98–111)
Creatinine, Ser: 0.58 mg/dL — ABNORMAL LOW (ref 0.61–1.24)
GFR calc Af Amer: >60 mL/min (ref >60)
GFR calc non Af Amer: >60 mL/min (ref >60)
Glucose, Bld: 82 mg/dL (ref 70–99)
Potassium: 3.8 mmol/L (ref 3.5–5.1)
Sodium: 129 mmol/L — ABNORMAL LOW (ref 135–145)

## 2020-03-22 NOTE — Progress Notes (Signed)
PROGRESS NOTE  Anthony Curry DUK:[id number] DOB: Apr 16, 1941 DOA: 03/13/2020 PCP: Patient, No Pcp Per   LOS: 8 days   Brief Narrative / Interim history: 79 year old male with history of chronic A. 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 CT scan of the chest done on 03/13/2020 showed increased pericardial effusion, previous right chest rib fracture involving the fourth sixth and seventh and eighth rib posteriorly.  Increased left effusion with bronchial wall thickening at the lung base concerned about aspiration pneumonitis.  -He is currently on demeclocycline and fluid restriction in his sodium has trended up and overall has remained stable varying between 128-130, 129 this morning  Active Problems Deconditioning -Initially physical therapy recommended SNF however he progressed nicely and now recommending home health PT.  Patient is interested in inpatient rehab and I think he is a candidate for that given high motivation as well as ability to work with physical therapy or high intensity.  I have placed a consult, discussed with admission coordinator, they will review the case, hopefully will have an answer tomorrow  Pericardial effusion -A 2D echo 03/13/2020 showed a moderate large to  moderate pericardial effusion no evidence of tamponade cardiology was consulted no further work-up. -This is chronic, he is being followed as an outpatient as well  Yeast UTI -Patient with dysuria and discomfort, speciated yeast, he was started on fluconazole on 4/16, Foley catheter was also exchanged on 4/16.  Plan for 7 days of antifungals, today is day 3  Stage 4 lung cancer, unspecified laterality ([place]), acute hypoxic respiratory failure due to aspiration pneumonia as well as post Covid pneumonitis versus lung cancer progression -Noted in 2014, chemotherapy is on hold since December 2020.  This was held due to his weakness cachexia and fragility.  Oncology consulted, [name], PA saw patient on 4/13.  Of note, patient mentions to me that 70+ years ago when he was a child he was told he has TB however never treated.  I'm not seeing evidence of that in his medical chart.  Obtain QuantiFERON -Patient with respiratory failure, imaging showed increased left effusion with bronchial wall thickening in the left lung base, concerning for aspiration pneumonitis and underlying airspace disease.  Increased nodularity in the right chest, pneumonitis versus disease worsening, small left-sided effusion.  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  Started on colchicine -d/w patient at bedside, radiology recommending holding Eliquis however he is very concerned and does not wish to do that. -Knee is better, able to work much better with physical therapy  Right posterior rib fracture -Likely because of his back pain continue Vicodin morphine and lidocaine  patch.  Hypothyroidism -Continue Synthroid  Recent left femoral neck fracture -Status post left hip  hemiarthroplasty by orthopedic surgery on 02/18/2020  History of perforated diverticulitis January 2021 -Status post sigmoid colectomy and colostomy creation by general surgery on 01/05/2020  Recent COVID-19 pneumonia with concern for post Covid pneumonitis -With hypoxic respiratory failure, he was hospitalized here and finished remdesivir on 2/4.  I wonder whether he has a degree of post Covid pneumonitis, agree with steroids for now.  History of ischemia to left hand-s/p left subclavian axillary brachial radial ulnar embolectomy on 2/1 by vascular surgery -Currently on Eliquis  Acute Hypoxic Resp Failure due to Covid 19 Viral pneumonia:Improved-down to just 1-2 L of oxygen. Completed remdesivir on 2/4. CRP still elevated but trending down-suspect CRP elevation more from surgical issues rather than pneumonia.  Urinary retention -With indwelling Foley catheter. -Failed last voiding trial was scheduled to follow-up with urology on 03/24/2020. -Continue Flomax follow-up with urology as an outpatient.  Chronic atrial fibrillation -continue Eliquis he is currently rate controlled  Pressure injury of skin Coccyx midline, POA RN Pressure Injury Documentation Pressure Injury 03/13/20 Coccyx Mid Stage 1 -  Intact skin with non-blanchable redness of a localized area usually over a bony prominence. quarter sized pink area over coccyx (Active)  03/13/20 1830  Location: Coccyx  Location Orientation: Mid  Staging: Stage 1 -  Intact skin with non-blanchable redness of a localized area usually over a bony prominence.  Wound Description (Comments): quarter sized pink area over coccyx  Present on Admission: Yes    Scheduled Meds: . amoxicillin-clavulanate  1 tablet Oral Q12H  . apixaban  5 mg Oral BID  . Chlorhexidine Gluconate Cloth  6 each Topical Daily  . colchicine  0.6 mg Oral BID  . demeclocycline  300 mg Oral BID  . dextromethorphan-guaiFENesin  1 tablet Oral BID  . diltiazem  180 mg  Oral Daily  . docusate sodium  100 mg Oral BID  . fluconazole  100 mg Oral Daily  . levothyroxine  50 mcg Oral Q0600  . lidocaine  1 patch Transdermal Q24H  . predniSONE  60 mg Oral Q breakfast  . sodium chloride flush  3 mL Intravenous Q12H  . tamsulosin  0.4 mg Oral BID   Continuous Infusions: PRN Meds:.acetaminophen **OR** acetaminophen, ALPRAZolam, bisacodyl, hydrALAZINE, HYDROcodone-acetaminophen, ipratropium-albuterol, morphine injection, ondansetron **OR** ondansetron (ZOFRAN) IV, polyethylene glycol, zolpidem  DVT prophylaxis: On Eliquis Code Status: DNR Family Communication: d/w patient  Patient admitted from: home Anticipated d/c place: home Barriers to d/c: Appears to be a good candidate for CIR, consulted, awaiting decision  Consultants:  Oncology Cardiology  Procedures:  2D echo:  IMPRESSIONS  1. Moderate to large pericardial effusion. The pericardial effusion is circumferential. Slightly larger compared to the 01/08/20 study. Effusion measures 1.6-1.8 cm adjacent to the RV in diastole depending on where measured. There is 10% respiratory variation across the MV, not significant. Partial RA diastolic indentation without full collapse. No RV collapse. IVC is not visualized. Overall findings not consistent with tamponade physiology.  2. Of note appears patient has two medical record numbers in regards to finding prior data and studies.   Microbiology  None   Antimicrobials: Augmentin 4/13 >>    Objective: Vitals:   03/21/20 1702 03/21/20 2032 03/21/20 2112 03/22/20 0756  BP: (!) 151/76 138/74 (!) 143/69 (!) 150/65  Pulse: 75 73 73 70  Resp: 19 14 16 19   Temp: 98.3 F (36.8 C) 98.5 F (36.9 C) 98.5 F (36.9  C) 98.4 F (36.9 C)  TempSrc:   Oral   SpO2: 96% 97% 97% 93%  Weight:      Height:        Intake/Output Summary (Last 24 hours) at 03/22/2020 1206 Last data filed at 03/22/2020 0729 Gross per 24 hour  Intake 3 ml  Output 1550 ml  Net -1547 ml    Filed Weights   03/13/20 0944 03/15/20 0500  Weight: 61.2 kg 62.7 kg    Examination:  Constitutional: NAD Eyes: No icterus ENMT: Moist mucous membranes Neck: normal, supple Respiratory: No wheezing, no crackles Cardiovascular: Regular rate and rhythm, no murmurs, no edema Abdomen: Soft, NT, ND, positive bowel sounds Musculoskeletal: no clubbing / cyanosis.  Skin: No rashes seen Neurologic: No focal deficits, equal strength  Data Reviewed: I have independently reviewed following labs and imaging studies   CBC: Recent Labs  Lab 03/17/20 0915 03/18/20 0258 03/19/20 0455 03/20/20 0308  WBC 9.7 4.8 7.4 8.1  NEUTROABS 6.7  --   --   --   HGB 11.6* 11.3* 10.8* 11.2*  HCT 35.2* 33.5* 32.0* 34.0*  MCV 88.0 86.6 86.0 85.9  PLT 318 369 412* 573*   Basic Metabolic Panel: Recent Labs  Lab 03/18/20 0258 03/19/20 0455 03/20/20 0308 03/21/20 0510 03/22/20 0459  NA 128* 130* 129* 130* 129*  K 4.7 4.7 4.0 3.8 3.8  CL 95* 95* 95* 97* 96*  CO2 27 27 25 29 30   GLUCOSE 144* 123* 88 81 82  BUN 14 14 13 15 15   CREATININE 0.57* 0.53* 0.56* 0.59* 0.58*  CALCIUM 8.5* 8.7* 8.6* 8.4* 8.5*  MG  --  1.7 1.6*  --   --    Liver Function Tests: Recent Labs  Lab 03/16/20 0300 03/17/20 0915  AST 15 21  ALT 13 17  ALKPHOS 87 96  BILITOT 1.0 1.0  PROT 4.2* 4.8*  ALBUMIN 2.1* 2.2*   Coagulation Profile: No results for input(s): INR, PROTIME in the last 168 hours. HbA1C: No results for input(s): HGBA1C in the last 72 hours. CBG: No results for input(s): GLUCAP in the last 168 hours.  Recent Results (from the past 240 hour(s))  SARS CORONAVIRUS 2 (TAT 6-24 HRS) Nasopharyngeal Nasopharyngeal Swab     Status: None   Collection Time: 03/13/20  1:51 PM   Specimen: Nasopharyngeal Swab  Result Value Ref Range Status   SARS Coronavirus 2 NEGATIVE NEGATIVE Final    Comment: (NOTE) SARS-CoV-2 target nucleic acids are NOT DETECTED. The SARS-CoV-2 RNA is generally detectable in upper  and lower respiratory specimens during the acute phase of infection. Negative results do not preclude SARS-CoV-2 infection, do not rule out co-infections with other pathogens, and should not be used as the sole basis for treatment or other patient management decisions. Negative results must be combined with clinical observations, patient history, and epidemiological information. The expected result is Negative. Fact Sheet for Patients: SugarRoll.be Fact Sheet for Healthcare Providers: https://www.woods-mathews.com/ This test is not yet approved or cleared by the [place] FDA and  has been authorized for detection and/or diagnosis of SARS-CoV-2 by FDA under an Emergency Use Authorization (EUA). This EUA will remain  in effect (meaning this test can be used) for the duration of the COVID-19 declaration under Section 56 4(b)(1) of the Act, 21 U.S.C. section 360bbb-3(b)(1), unless the authorization is terminated or revoked sooner. Performed at [facility], [place] [address], [place], [place] [address]   Culture, Urine     Status: Abnormal  Collection Time: 03/19/20 10:43 AM   Specimen: Urine, Random  Result Value Ref Range Status   Specimen Description URINE, RANDOM  Final   Special Requests   Final    NONE Performed at [facility], [place] [address], [place], [place] [address]    Culture >=100,000 COLONIES/mL YEAST (A)  Final   Report Status 03/20/2020 FINAL  Final     Radiology Studies: No results found.  Marzetta Board, MD, PhD [facility]  Between 7 am - 7 pm I am available, please contact me via Amion or Securechat  Between 7 pm - 7 am I am not available, please contact night coverage MD/APP via Amion

## 2020-03-23 LAB — BASIC METABOLIC PANEL
Anion gap: 9 (ref 5–15)
BUN: 20 mg/dL (ref 8–23)
CO2: 26 mmol/L (ref 22–32)
Calcium: 8.7 mg/dL — ABNORMAL LOW (ref 8.9–10.3)
Chloride: 95 mmol/L — ABNORMAL LOW (ref 98–111)
Creatinine, Ser: 0.63 mg/dL (ref 0.61–1.24)
GFR calc Af Amer: >60 mL/min (ref >60)
GFR calc non Af Amer: >60 mL/min (ref >60)
Glucose, Bld: 98 mg/dL (ref 70–99)
Potassium: 4.2 mmol/L (ref 3.5–5.1)
Sodium: 130 mmol/L — ABNORMAL LOW (ref 135–145)

## 2020-03-23 MED ORDER — MENTHOL 3 MG MT LOZG
1.0000 | LOZENGE | OROMUCOSAL | Status: DC | PRN
  Administered 2020-03-23: 3 mg via ORAL
  Filled 2020-03-23: qty 9

## 2020-03-23 MED ORDER — GUAIFENESIN-DM 100-10 MG/5ML PO SYRP
5.0000 mL | ORAL_SOLUTION | ORAL | Status: DC | PRN
  Administered 2020-03-23: 5 mL via ORAL
  Filled 2020-03-23: qty 5

## 2020-03-23 NOTE — Discharge Instructions (Signed)

## 2020-03-23 NOTE — Progress Notes (Signed)
Occupational Therapy Treatment Patient Details Name: Anthony Curry MRN: [id number] DOB: 1941-03-23 Today's Date: 03/23/2020    History of present illness 79 yo male with PMH including afib, pericardial effusion, HTN, hyponatremia, stage IV SCC s/p radiation, remote adrenal cancer, ETOH dependence presents to ED with fall from w/c without LOC, medical diagnoses of symptomatic hyponatremia and posterior R 4-8 rib fractures.Pt with recent hospitalization in 12/2019 for perforated diverticulitis s/p sigmoid colectomy with colostomy, development of ischemic L hand s/p L brachial embolectomy and covid 19 PNA. Again hospitalized in 02/2020 due to hip fracture.   OT comments  Pt continues to fatigue with minimal exertion and require and extended seated rest break in which to recover. Pt with HR to low 120s with activity and Sp02 to 91% on RA. Pt donned front opening gown and shoes with set up and stood at sink for oral care x 3 minutes before needing a seated rest break. Updated d/c recommendation to CIR.  Follow Up Recommendations  CIR    Equipment Recommendations  None recommended by OT    Recommendations for Other Services      Precautions / Restrictions Precautions Precautions: Fall Precaution Comments: colostomy; foley Restrictions Weight Bearing Restrictions: No       Mobility Bed Mobility Overal bed mobility: Modified Independent    General bed mobility comments: HOB Up  Transfers Overall transfer level: Needs assistance Equipment used: Rolling walker (2 wheeled) Transfers: Sit to/from Stand Sit to Stand: Min assist;Min guard         General transfer comment: min from lower surface    Balance Overall balance assessment: Needs assistance;History of Falls Sitting-balance support: Feet supported;No upper extremity supported Sitting balance-Leahy Scale: Good     Standing balance support: Bilateral upper extremity supported;During functional activity Standing  balance-Leahy Scale: Poor Standing balance comment: reliant on external support in standing                           ADL either performed or assessed with clinical judgement   ADL Overall ADL's : Needs assistance/impaired     Grooming: Min guard;Standing;Oral care;Brushing hair;Sitting           Upper Body Dressing : Set up;Sitting   Lower Body Dressing: Set up;Sitting/lateral leans               Functional mobility during ADLs: Min guard;Rolling walker General ADL Comments: educated in pursed lip breathing techniques, monitoring of symptoms as he tends to rush through activities as he fatigues     Vision       Perception     Praxis      Cognition Arousal/Alertness: Awake/alert Behavior During Therapy: Anxious Overall Cognitive Status: Impaired/Different from baseline Area of Impairment: Safety/judgement;Problem solving;Memory;Following commands                 Orientation Level: Situation Current Attention Level: Selective Memory: Decreased short-term memory Following Commands: Follows one step commands consistently Safety/Judgement: Decreased awareness of safety;Decreased awareness of deficits   Problem Solving: Decreased initiation;Requires verbal cues;Requires tactile cues General Comments: pt focused on anxiety related to SOB with exertion and repeately asking why        Exercises     Shoulder Instructions       General Comments      Pertinent Vitals/ Pain       Pain Assessment: Faces Faces Pain Scale: No hurt Pain Location: back, ribs Pain Descriptors / Indicators: Discomfort Pain Intervention(s): Limited  activity within patient's tolerance;Monitored during session;Repositioned  Home Living                                          Prior Functioning/Environment              Frequency  Min 2X/week        Progress Toward Goals  OT Goals(current goals can now be found in the care plan  section)  Progress towards OT goals: Progressing toward goals  Acute Rehab OT Goals Patient Stated Goal: go to CIR OT Goal Formulation: With patient/family Time For Goal Achievement: 03/29/20 Potential to Achieve Goals: Good  Plan Discharge plan needs to be updated    Co-evaluation    PT/OT/SLP Co-Evaluation/Treatment: Yes Reason for Co-Treatment: For patient/therapist safety PT goals addressed during session: Mobility/safety with mobility;Proper use of DME OT goals addressed during session: ADL's and self-care      AM-PAC OT "6 Clicks" Daily Activity     Outcome Measure   Help from another person eating meals?: None Help from another person taking care of personal grooming?: A Little Help from another person toileting, which includes using toliet, bedpan, or urinal?: None Help from another person bathing (including washing, rinsing, drying)?: A Little Help from another person to put on and taking off regular upper body clothing?: A Little Help from another person to put on and taking off regular lower body clothing?: A Little 6 Click Score: 20    End of Session Equipment Utilized During Treatment: Gait belt;Rolling walker;Oxygen(used 2L 02 to assist in recovery)  OT Visit Diagnosis: Unsteadiness on feet (R26.81);Other abnormalities of gait and mobility (R26.89);Muscle weakness (generalized) (M62.81);History of falling (Z91.81)   Activity Tolerance Patient limited by fatigue   Patient Left in bed;with call bell/phone within reach;with family/visitor present;with bed alarm set   Nurse Communication          Time: 4174-0814 OT Time Calculation (min): 32 min  Charges: OT General Charges $OT Visit: 1 Visit OT Treatments $Self Care/Home Management : 8-22 mins  Nestor Lewandowsky, OTR/L Acute Rehabilitation Services Pager: [phone] Office: [phone]   [name] 03/23/2020, 11:23 AM

## 2020-03-23 NOTE — Progress Notes (Signed)
Physical Therapy Treatment Patient Details Name: Anthony Curry MRN: [id number] DOB: [birth year] Today's Date: 03/23/2020    History of Present Illness 79 yo male with PMH including afib, pericardial effusion, HTN, hyponatremia, stage IV SCC s/p radiation, remote adrenal cancer, ETOH dependence presents to ED with fall from w/c without LOC, medical diagnoses of symptomatic hyponatremia and posterior R 4-8 rib fractures.Pt with recent hospitalization in 12/2019 for perforated diverticulitis s/p sigmoid colectomy with colostomy, development of ischemic L hand s/p L brachial embolectomy and covid 19 PNA. Again hospitalized in 02/2020 due to hip fracture.    PT Comments    Pt motivated to mobilize OOB and progress mobility this day. PT and OT saw pt together given need for +2 assist during hallway navigation and to maximize mobility. Significantly reduced L knee swelling noted, and pt reporting less discomfort. Pt required min guard-min assist for mobility this day, and requires mod verbal cuing for safety with RW use, hallway navigation, and transitioning sit<>stand. Pt with significant dyspnea on exertion during gait training this day, requiring seated rest break x5 minutes and instructions in pursed lip breathing. SpO2 WFL >91% throughout mobility but given very increased work of breathing, 2LO2 applied for recovery mid-gait. PT updated d/c recommendation to reflect CIR, given pt's extensive pulmonary pathology, pt's debility, and pt and caregiver desire for pt to maximize functional mobility. PT to continue to follow acutely.    Follow Up Recommendations  Supervision/Assistance - 24 hour;CIR     Equipment Recommendations  None recommended by PT    Recommendations for Other Services Rehab consult     Precautions / Restrictions Precautions Precautions: Fall Precaution Comments: colostomy; foley Restrictions Weight Bearing Restrictions: No    Mobility  Bed Mobility Overal bed mobility:  Needs Assistance Bed Mobility: Supine to Sit     Supine to sit: Supervision;HOB elevated Sit to supine: HOB elevated;Min guard   General bed mobility comments: supervision for supine to sit fro safety, close guard for return to supine with assist for lines/leads.  Transfers Overall transfer level: Needs assistance Equipment used: Rolling walker (2 wheeled) Transfers: Sit to/from Stand Sit to Stand: Min assist         General transfer comment: Min assist for sit to stand from regular chair height for initial power up and steadying, more like min guard with elevated bed. Sit to stand x4, from EOB x2 pre- and post-ambulation, and from chair height x2 during hallway seated rest and ADL rest at sink.  Ambulation/Gait Ambulation/Gait assistance: Min guard;+2 safety/equipment Gait Distance (Feet): 85 [id number]) Assistive device: Rolling walker (2 wheeled) Gait Pattern/deviations: Step-through pattern;Decreased stride length;Trunk flexed;Shuffle Gait velocity: decr   General Gait Details: Close guard for safety, with chair follow for pt fatigue and DOE 3/4. Verbal cuing for upright posture, placement in RW. Seated rest break x1, with verbal cuing for breathing technique. Pt on RA during gait, requires supplemental O2 for work of breathing, SpO2 91-96% throughout mobility when assessed.   Stairs             Wheelchair Mobility    Modified Rankin (Stroke Patients Only)       Balance Overall balance assessment: Needs assistance;History of Falls Sitting-balance support: Feet supported;No upper extremity supported Sitting balance-Leahy Scale: Fair     Standing balance support: Bilateral upper extremity supported;During functional activity Standing balance-Leahy Scale: Poor Standing balance comment: reliant on external support in standing  Cognition Arousal/Alertness: Awake/alert Behavior During Therapy: Anxious Overall Cognitive  Status: Impaired/Different from baseline Area of Impairment: Safety/judgement;Orientation;Problem solving;Memory;Following commands                 Orientation Level: Situation Current Attention Level: Selective Memory: Decreased short-term memory Following Commands: Follows one step commands consistently Safety/Judgement: Decreased awareness of safety;Decreased awareness of deficits   Problem Solving: Decreased initiation;Requires verbal cues;Requires tactile cues General Comments: Pt with repeated questions about why he feels short of breath, PT and OT explained pt's lung issues and deconditioning multiple times. Pt states "I haven't gotten up in a week" when he got up with RN staff over the weekend per wife's report. Requires cuing for safety, especially when transitioning sit<>stand.      Exercises      General Comments        Pertinent Vitals/Pain Pain Assessment: Faces Faces Pain Scale: Hurts a little bit Pain Location: back, ribs Pain Descriptors / Indicators: Discomfort Pain Intervention(s): Limited activity within patient's tolerance;Monitored during session;Repositioned    Home Living                      Prior Function            PT Goals (current goals can now be found in the care plan section) Acute Rehab PT Goals Patient Stated Goal: go to CIR PT Goal Formulation: With patient/family Time For Goal Achievement: 03/29/20 Potential to Achieve Goals: Good Progress towards PT goals: Progressing toward goals    Frequency    Min 3X/week      PT Plan Discharge plan needs to be updated    Co-evaluation PT/OT/SLP Co-Evaluation/Treatment: Yes Reason for Co-Treatment: For patient/therapist safety;Necessary to address cognition/behavior during functional activity;To address functional/ADL transfers PT goals addressed during session: Mobility/safety with mobility;Proper use of DME        AM-PAC PT "6 Clicks" Mobility   Outcome Measure   Help needed turning from your back to your side while in a flat bed without using bedrails?: A Little Help needed moving from lying on your back to sitting on the side of a flat bed without using bedrails?: A Little Help needed moving to and from a bed to a chair (including a wheelchair)?: A Little Help needed standing up from a chair using your arms (e.g., wheelchair or bedside chair)?: A Little Help needed to walk in hospital room?: A Little Help needed climbing 3-5 steps with a railing? : A Lot 6 Click Score: 17    End of Session Equipment Utilized During Treatment: Oxygen;Gait belt Activity Tolerance: Patient limited by fatigue Patient left: in bed;with family/visitor present;with call bell/phone within reach Nurse Communication: Mobility status PT Visit Diagnosis: Muscle weakness (generalized) (M62.81);History of falling (Z91.81);Pain;Difficulty in walking, not elsewhere classified (R26.2) Pain - Right/Left: Left Pain - part of body: Knee(and R ribcage at fx sites)     Time: 9798-9211 PT Time Calculation (min) (ACUTE ONLY): 32 min  Charges:  $Gait Training: 8-22 mins                     Suella Cogar E, PT [place] Pager [phone]  Office [phone]    Tahja Liao D Jazmon Kos 03/23/2020, 11:10 AM

## 2020-03-23 NOTE — Progress Notes (Signed)
Inpatient Rehabilitation Admissions Coordinator  Inpatient rehab consult received. I met with patient at bedside with his wife. We discussed goals and expectations of a possible inpt rehab admit. Pt does not want to return to SNF for he has had two bad experiences in past few months. Patient is a good candidate pending bed availability over the next few days. I will follow up tomorrow to clarify bed availability .  Danne Baxter, RN, MSN Rehab Admissions Coordinator [phone] 03/23/2020 2:30 PM

## 2020-03-23 NOTE — Progress Notes (Signed)
PROGRESS NOTE  Anthony Curry MEQ:[id number] DOB: 1941/03/15 DOA: 03/13/2020 PCP: Patient, No Pcp Per   LOS: 9 days   Brief Narrative / Interim history: 79 year old male with history of chronic A. 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   Subjective / 24h Interval events: No complaints this morning other than feeling tired. Still has a cough which is bothering him a lot.   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 CT scan of the chest done on 03/13/2020 showed increased pericardial effusion, previous right chest rib fracture involving the fourth sixth and seventh and eighth rib posteriorly.  Increased left effusion with bronchial wall thickening at the lung base concerned about aspiration pneumonitis.  -He is currently on demeclocycline and fluid restriction in his sodium has trended up and overall has remained stable varying between 128-130, 130 today.  Continue to monitor  Active Problems Deconditioning -Initially physical therapy recommended SNF however he progressed nicely and now recommending home health PT.  Patient is interested in inpatient rehab and I think he is a candidate for that given high motivation as well as ability to work with physical therapy or high intensity.  Discussed with AC from rehab, note reviewed, he does appear to be a good candidate pending bed availability so hopefully he will be able to go soon  Pericardial effusion -A 2D echo 03/13/2020 showed a moderate large to moderate pericardial  effusion no evidence of tamponade cardiology was consulted no further work-up. -This is chronic, he is being followed as an outpatient as well  Yeast UTI -Patient with dysuria and discomfort, speciated yeast, he was started on fluconazole on 4/16, Foley catheter was also exchanged on 4/16.  Plan for 7 days of antifungals, today is day 4 -His symptoms have resolved  Stage 4 lung cancer, unspecified laterality ([place]), acute hypoxic respiratory failure due to aspiration pneumonia as well as post Covid pneumonitis versus lung cancer progression -Noted in 2014, chemotherapy is on hold since December 2020.  This was held due to his weakness cachexia and fragility.  Oncology consulted, [name], PA saw patient on 4/13.  Of note, patient mentions to me that 70+ years ago when he was a child he was told he has TB however never treated.  I'm not seeing evidence of that in his medical chart.  Obtain QuantiFERON -Patient with respiratory failure, imaging showed increased left effusion with bronchial wall thickening in the left lung base, concerning for aspiration pneumonitis and underlying airspace disease.  Increased nodularity in the right chest, pneumonitis versus disease worsening, small left-sided effusion.  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  Started on colchicine -d/w patient at bedside, radiology recommending holding Eliquis however he is very concerned and does not wish to do that. -Knee is better, able to work much better with physical therapy  Right posterior rib fracture -Likely because of his back pain continue  Vicodin morphine and lidocaine patch.  Hypothyroidism -Continue Synthroid  Recent left femoral neck fracture -Status post left  hip hemiarthroplasty by orthopedic surgery on 02/18/2020  History of perforated diverticulitis January 2021 -Status post sigmoid colectomy and colostomy creation by general surgery on 01/05/2020  Recent COVID-19 pneumonia with concern for post Covid pneumonitis -With hypoxic respiratory failure, he was hospitalized here and finished remdesivir on 2/4.  I wonder whether he has a degree of post Covid pneumonitis, continue steroids  History of ischemia to left hand-s/p left subclavian axillary brachial radial ulnar embolectomy on 2/1 by vascular surgery -Currently on Eliquis  Acute Hypoxic Resp Failure due to Covid 19 Viral pneumonia:Improved-down to just 1-2 L of oxygen. Completed remdesivir on 2/4. CRP still elevated but trending down-suspect CRP elevation more from surgical issues rather than pneumonia.  Urinary retention -With indwelling Foley catheter. -Failed last voiding trial was scheduled to follow-up with urology on 03/24/2020. -Continue Flomax follow-up with urology as an outpatient.  Chronic atrial fibrillation -continue Eliquis he is currently rate controlled  Pressure injury of skin Coccyx midline, POA RN Pressure Injury Documentation Pressure Injury 03/13/20 Coccyx Mid Stage 1 -  Intact skin with non-blanchable redness of a localized area usually over a bony prominence. quarter sized pink area over coccyx (Active)  03/13/20 1830  Location: Coccyx  Location Orientation: Mid  Staging: Stage 1 -  Intact skin with non-blanchable redness of a localized area usually over a bony prominence.  Wound Description (Comments): quarter sized pink area over coccyx  Present on Admission: Yes    Scheduled Meds: . amoxicillin-clavulanate  1 tablet Oral Q12H  . apixaban  5 mg Oral BID  . Chlorhexidine Gluconate Cloth  6 each Topical Daily  . colchicine  0.6 mg Oral BID  . demeclocycline  300 mg Oral BID  . dextromethorphan-guaiFENesin  1 tablet Oral BID  . diltiazem  180 mg Oral  Daily  . docusate sodium  100 mg Oral BID  . fluconazole  100 mg Oral Daily  . levothyroxine  50 mcg Oral Q0600  . lidocaine  1 patch Transdermal Q24H  . predniSONE  60 mg Oral Q breakfast  . sodium chloride flush  3 mL Intravenous Q12H  . tamsulosin  0.4 mg Oral BID   Continuous Infusions: PRN Meds:.acetaminophen **OR** acetaminophen, ALPRAZolam, bisacodyl, hydrALAZINE, HYDROcodone-acetaminophen, ipratropium-albuterol, morphine injection, ondansetron **OR** ondansetron (ZOFRAN) IV, polyethylene glycol, zolpidem  DVT prophylaxis: On Eliquis Code Status: DNR Family Communication: d/w patient  Patient admitted from: home Anticipated d/c place: home Barriers to d/c: Appears to be a good candidate for CIR, consulted, awaiting bed  Consultants:  Oncology Cardiology  Procedures:  2D echo:  IMPRESSIONS  1. Moderate to large pericardial effusion. The pericardial effusion is circumferential. Slightly larger compared to the 01/08/20 study. Effusion measures 1.6-1.8 cm adjacent to the RV in diastole depending on where measured. There is 10% respiratory variation across the MV, not significant. Partial RA diastolic indentation without full collapse. No RV collapse. IVC is not visualized. Overall findings not consistent with tamponade physiology.  2. Of note appears patient has two medical record numbers in regards to finding prior data and studies.   Microbiology  None   Antimicrobials: Augmentin 4/13 >>    Objective: Vitals:   03/22/20 2011 03/22/20 2100 03/23/20 0805 03/23/20 1445  BP: (!) 172/65 136/70 (!) 165/58 140/62  Pulse: 81 71 68 78  Resp: 18  20 18   Temp: 98 F (36.7 C)  97.8 F (36.6 C) 98.4 F (  36.9 C)  TempSrc:   Oral   SpO2: 97%  96% 95%  Weight:      Height:        Intake/Output Summary (Last 24 hours) at 03/23/2020 1454 Last data filed at 03/23/2020 0550 Gross per 24 hour  Intake 3 ml  Output 975 ml  Net -972 ml   Filed Weights   03/13/20 0944 03/15/20  0500  Weight: 61.2 kg 62.7 kg    Examination:  Constitutional: No distress Eyes: No icterus ENMT: Moist mucous membranes Neck: normal, supple Respiratory: No wheezing, no crackles Cardiovascular: Regular rate and rhythm, no murmurs, no edema Abdomen: Soft, nontender, nondistended, positive bowel sounds Musculoskeletal: no clubbing / cyanosis.  Skin: No rashes seen Neurologic: Nonfocal, equal strength  Data Reviewed: I have independently reviewed following labs and imaging studies   CBC: Recent Labs  Lab 03/17/20 0915 03/18/20 0258 03/19/20 0455 03/20/20 0308  WBC 9.7 4.8 7.4 8.1  NEUTROABS 6.7  --   --   --   HGB 11.6* 11.3* 10.8* 11.2*  HCT 35.2* 33.5* 32.0* 34.0*  MCV 88.0 86.6 86.0 85.9  PLT 318 369 412* 637*   Basic Metabolic Panel: Recent Labs  Lab 03/19/20 0455 03/20/20 0308 03/21/20 0510 03/22/20 0459 03/23/20 0532  NA 130* 129* 130* 129* 130*  K 4.7 4.0 3.8 3.8 4.2  CL 95* 95* 97* 96* 95*  CO2 27 25 29 30 26   GLUCOSE 123* 88 81 82 98  BUN 14 13 15 15 20   CREATININE 0.53* 0.56* 0.59* 0.58* 0.63  CALCIUM 8.7* 8.6* 8.4* 8.5* 8.7*  MG 1.7 1.6*  --   --   --    Liver Function Tests: Recent Labs  Lab 03/17/20 0915  AST 21  ALT 17  ALKPHOS 96  BILITOT 1.0  PROT 4.8*  ALBUMIN 2.2*   Coagulation Profile: No results for input(s): INR, PROTIME in the last 168 hours. HbA1C: No results for input(s): HGBA1C in the last 72 hours. CBG: No results for input(s): GLUCAP in the last 168 hours.  Recent Results (from the past 240 hour(s))  Culture, Urine     Status: Abnormal   Collection Time: 03/19/20 10:43 AM   Specimen: Urine, Random  Result Value Ref Range Status   Specimen Description URINE, RANDOM  Final   Special Requests   Final    NONE Performed at [facility], [address] [place], [place] [address]    Culture >=100,000 COLONIES/mL YEAST (A)  Final   Report Status 03/20/2020 FINAL  Final     Radiology Studies: No results  found.  Marzetta Board, MD, PhD [facility]  Between 7 am - 7 pm I am available, please contact me via Amion or Securechat  Between 7 pm - 7 am I am not available, please contact night coverage MD/APP via Amion

## 2020-03-24 LAB — BASIC METABOLIC PANEL
Anion gap: 10 (ref 5–15)
BUN: 21 mg/dL (ref 8–23)
CO2: 24 mmol/L (ref 22–32)
Calcium: 8.5 mg/dL — ABNORMAL LOW (ref 8.9–10.3)
Chloride: 95 mmol/L — ABNORMAL LOW (ref 98–111)
Creatinine, Ser: 0.61 mg/dL (ref 0.61–1.24)
GFR calc Af Amer: >60 mL/min (ref >60)
GFR calc non Af Amer: >60 mL/min (ref >60)
Glucose, Bld: 102 mg/dL — ABNORMAL HIGH (ref 70–99)
Potassium: 4.1 mmol/L (ref 3.5–5.1)
Sodium: 129 mmol/L — ABNORMAL LOW (ref 135–145)

## 2020-03-24 NOTE — Progress Notes (Signed)
Inpatient Rehabilitation Admissions Coordinator  I met with patient and his wife at bedside to let them know that I do not have a bed at [facility] to admit him today. I will follow up tomorrow.  Danne Baxter, RN, MSN Rehab Admissions Coordinator [phone] 03/24/2020 3:37 PM

## 2020-03-24 NOTE — Progress Notes (Signed)
PROGRESS NOTE  Anthony Curry EGB:[id number] DOB: 1941-01-03 DOA: 03/13/2020 PCP: Patient, No Pcp Per   LOS: 10 days   Brief Narrative / Interim history: 79 year old male with history of chronic A. 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   Subjective / 24h Interval events: Seems a bit hopeful that he is a good candidate for CIR and looking forward to rehab.   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 CT scan of the chest done on 03/13/2020 showed increased pericardial effusion, previous right chest rib fracture involving the fourth sixth and seventh and eighth rib posteriorly.  Increased left effusion with bronchial wall thickening at the lung base concerned about aspiration pneumonitis. He is currently on demeclocycline and fluid restriction in his sodium has trended up and overall has remained stable varying between 129-130 for the past several days. Monitor daily  Active Problems Deconditioning -Initially physical therapy recommended SNF however he progressed nicely and now recommending home health PT.  Patient is interested in inpatient rehab and I think he is a candidate for that given high motivation as well as ability to work with physical therapy or high intensity.  Discussed with AC from rehab, note reviewed, he does appear to be a good candidate pending bed availability so hopefully he will be able to go soon  Pericardial effusion -A 2D echo 03/13/2020 showed a moderate large to moderate pericardial effusion no  evidence of tamponade cardiology was consulted no further work-up. This is chronic, likely malignant, he is being followed as an outpatient as well  Yeast UTI -Patient with dysuria and discomfort, speciated yeast, he was started on fluconazole on 4/16, Foley catheter was also exchanged on 4/16.  Plan for 7 days of antifungals, today is day 5. His symptoms have now resolved  Stage 4 lung cancer, unspecified laterality ([place]), acute hypoxic respiratory failure due to aspiration pneumonia as well as post Covid pneumonitis versus lung cancer progression -Noted in 2014, chemotherapy is on hold since December 2020.  This was held due to his weakness cachexia and fragility.  Oncology consulted, [name], PA saw patient.  Of note, patient mentions to me that 70+ years ago when he was a child he was told he has TB however never treated.  I'm not seeing evidence of that in his medical chart. QuantiFERON negative.  -Patient with respiratory failure, imaging showed increased left effusion with bronchial wall thickening in the left lung base, concerning for aspiration pneumonitis and underlying airspace disease.  Increased nodularity in the right chest, pneumonitis versus disease worsening, small left-sided effusion. Finishing 7 days of Augmentin today. 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  Started on colchicine. Plans were in place for arthrocentesis however his Eliquis would have needed to be held. Patient did not want to stop Eliquis so knee was not tapped. Now improved, better, able to work much better with physical therapy  Right posterior rib fracture -Likely  because of his back pain continue Vicodin morphine and lidocaine patch.  Hypothyroidism -Continue Synthroid  Recent left  femoral neck fracture -Status post left hip hemiarthroplasty by orthopedic surgery on 02/18/2020  History of perforated diverticulitis January 2021 -Status post sigmoid colectomy and colostomy creation by general surgery on 01/05/2020  Recent COVID-19 pneumonia with concern for post Covid pneumonitis -With hypoxic respiratory failure, he was hospitalized here and finished remdesivir on 2/4.  I wonder whether he has a degree of post Covid pneumonitis, continue steroids, will need a prolonged taper  History of ischemia to left hand-s/p left subclavian axillary brachial radial ulnar embolectomy on 2/1 by vascular surgery -Currently on Eliquis  Acute Hypoxic Resp Failure due to Covid 19 Viral pneumonia-Improved-down to RA at rest and 2-3 L with activity. Again I have concerns for post Covid pneumonitis. S/p remdesivir on 2/4.   Urinary retention -With indwelling Foley catheter.Failed last voiding trial was scheduled to follow-up with urology on 03/24/2020. Continue Flomax follow-up with urology as an outpatient.  Chronic atrial fibrillation -continue Eliquis he is currently rate controlled  Pressure injury of skin Coccyx midline, POA RN Pressure Injury Documentation Pressure Injury 03/13/20 Coccyx Mid Stage 1 -  Intact skin with non-blanchable redness of a localized area usually over a bony prominence. quarter sized pink area over coccyx (Active)  03/13/20 1830  Location: Coccyx  Location Orientation: Mid  Staging: Stage 1 -  Intact skin with non-blanchable redness of a localized area usually over a bony prominence.  Wound Description (Comments): quarter sized pink area over coccyx  Present on Admission: Yes    Scheduled Meds: . apixaban  5 mg Oral BID  . Chlorhexidine Gluconate Cloth  6 each Topical Daily  . colchicine  0.6 mg Oral BID  . demeclocycline  300 mg Oral BID  . dextromethorphan-guaiFENesin  1 tablet Oral BID  . diltiazem  180 mg Oral Daily  . docusate sodium  100 mg Oral BID    . fluconazole  100 mg Oral Daily  . levothyroxine  50 mcg Oral Q0600  . lidocaine  1 patch Transdermal Q24H  . predniSONE  60 mg Oral Q breakfast  . sodium chloride flush  3 mL Intravenous Q12H  . tamsulosin  0.4 mg Oral BID   Continuous Infusions: PRN Meds:.acetaminophen **OR** acetaminophen, ALPRAZolam, bisacodyl, guaiFENesin-dextromethorphan, hydrALAZINE, HYDROcodone-acetaminophen, ipratropium-albuterol, menthol-cetylpyridinium, morphine injection, ondansetron **OR** ondansetron (ZOFRAN) IV, polyethylene glycol, zolpidem  DVT prophylaxis: On Eliquis Code Status: DNR Family Communication: d/w patient  Patient admitted from: home Anticipated d/c place: home Barriers to d/c: CIR bed pending  Consultants:  Oncology Cardiology  Procedures:  2D echo:  IMPRESSIONS  1. Moderate to large pericardial effusion. The pericardial effusion is circumferential. Slightly larger compared to the 01/08/20 study. Effusion measures 1.6-1.8 cm adjacent to the RV in diastole depending on where measured. There is 10% respiratory variation across the MV, not significant. Partial RA diastolic indentation without full collapse. No RV collapse. IVC is not visualized. Overall findings not consistent with tamponade physiology.  2. Of note appears patient has two medical record numbers in regards to finding prior data and studies.   Microbiology  None   Antimicrobials: Augmentin 4/13 >> 4/20   Objective: Vitals:   03/23/20 0805 03/23/20 1445 03/23/20 2310 03/24/20 0758  BP: (!) 165/58 140/62 (!) 147/64 (!) 154/68  Pulse: 68 78 66 66  Resp: 20 18 18 16   Temp: 97.8 F (36.6 C) 98.4 F (36.9 C) 97.9 F (36.6 C) 97.8 F (36.6 C)  TempSrc: Oral   Oral  SpO2: 96% 95% 98% 93%  Weight:      Height:        Intake/Output Summary (Last 24 hours) at 03/24/2020 1521 Last data filed at 03/24/2020 1250 Gross per 24 hour  Intake 480 ml  Output 1451 ml  Net -971 ml   Filed Weights   03/13/20 0944 03/15/20  0500  Weight: 61.2 kg 62.7 kg    Examination:  Constitutional: NAD Eyes: no icterus ENMT: mmm Neck: normal, supple Respiratory: no wheezing, no crackles Cardiovascular: RRR, no murmurs, no edema Abdomen: soft, NT, ND, BS+, ostomy bad in place Musculoskeletal: no clubbing / cyanosis.  Skin: no rashes Neurologic: non focal, equal strength  Data Reviewed: I have independently reviewed following labs and imaging studies   CBC: Recent Labs  Lab 03/18/20 0258 03/19/20 0455 03/20/20 0308  WBC 4.8 7.4 8.1  HGB 11.3* 10.8* 11.2*  HCT 33.5* 32.0* 34.0*  MCV 86.6 86.0 85.9  PLT 369 412* 176*   Basic Metabolic Panel: Recent Labs  Lab 03/19/20 0455 03/19/20 0455 03/20/20 0308 03/21/20 0510 03/22/20 0459 03/23/20 0532 03/24/20 0302  NA 130*   < > 129* 130* 129* 130* 129*  K 4.7   < > 4.0 3.8 3.8 4.2 4.1  CL 95*   < > 95* 97* 96* 95* 95*  CO2 27   < > 25 29 30 26 24   GLUCOSE 123*   < > 88 81 82 98 102*  BUN 14   < > 13 15 15 20 21   CREATININE 0.53*   < > 0.56* 0.59* 0.58* 0.63 0.61  CALCIUM 8.7*   < > 8.6* 8.4* 8.5* 8.7* 8.5*  MG 1.7  --  1.6*  --   --   --   --    < > = values in this interval not displayed.   Liver Function Tests: No results for input(s): AST, ALT, ALKPHOS, BILITOT, PROT, ALBUMIN in the last 168 hours. Coagulation Profile: No results for input(s): INR, PROTIME in the last 168 hours. HbA1C: No results for input(s): HGBA1C in the last 72 hours. CBG: No results for input(s): GLUCAP in the last 168 hours.  Recent Results (from the past 240 hour(s))  Culture, Urine     Status: Abnormal   Collection Time: 03/19/20 10:43 AM   Specimen: Urine, Random  Result Value Ref Range Status   Specimen Description URINE, RANDOM  Final   Special Requests   Final    NONE Performed at [facility], [address], [place], [place] [address]    Culture >=100,000 COLONIES/mL YEAST (A)  Final   Report Status 03/20/2020 FINAL  Final     Radiology  Studies: No results found.  Marzetta Board, MD, PhD [facility]  Between 7 am - 7 pm I am available, please contact me via Amion or Securechat  Between 7 pm - 7 am I am not available, please contact night coverage MD/APP via Amion

## 2020-03-24 NOTE — Progress Notes (Signed)
Physical Therapy Treatment Patient Details Name: Anthony Curry MRN: [id number] DOB: 20-Nov-1941 Today's Date: 03/24/2020    History of Present Illness 79 yo male with PMH including afib, pericardial effusion, HTN, hyponatremia, stage IV SCC s/p radiation, remote adrenal cancer, ETOH dependence presents to ED with fall from w/c without LOC, medical diagnoses of symptomatic hyponatremia and posterior R 4-8 rib fractures.Pt with recent hospitalization in 12/2019 for perforated diverticulitis s/p sigmoid colectomy with colostomy, development of ischemic L hand s/p L brachial embolectomy and covid 19 PNA. Again hospitalized in 02/2020 due to hip fracture.    PT Comments    Pt willing to participate in OOB mobility. Pt ambulated short, repeated hallway bouts requiring cuing for breathing technique, initiating rest breaks to recover significant dyspnea (SpO2 94% and greater on RA), and use of rollator. PT initiated rollator use this day because it is what pt uses at home. Pt with depressed affect about current situation, eager to d/c to CIR.    Follow Up Recommendations  Supervision/Assistance - 24 hour;CIR     Equipment Recommendations  None recommended by PT    Recommendations for Other Services Rehab consult     Precautions / Restrictions Precautions Precautions: Fall Precaution Comments: colostomy; foley Restrictions Weight Bearing Restrictions: No    Mobility  Bed Mobility Overal bed mobility: Needs Assistance Bed Mobility: Supine to Sit;Sit to Supine     Supine to sit: Min guard Sit to supine: Min assist   General bed mobility comments: min guard for supine to sit for safety with use of bedrails and increased time, min assist for sit to supine for LE lifting into bed, positioning.  Transfers Overall transfer level: Needs assistance Equipment used: 4-wheeled walker Transfers: Sit to/from Stand Sit to Stand: Min assist         General transfer comment: Min assist for  power up, pt with self-steady with increased time. Verbal cuing for hand placement when rising, sit to stand x3 during mobility.  Ambulation/Gait Ambulation/Gait assistance: Min guard Gait Distance (Feet): 75 Feet(2x75, 1x30) Assistive device: 4-wheeled walker Gait Pattern/deviations: Step-through pattern;Decreased stride length;Trunk flexed;Shuffle Gait velocity: decr   General Gait Details: close guard for safety, verbal cuing for upright posture, checking in for need of rest break. SpO2 94-97% on RA when checked during ambulation, DOE 3/4. Seated rest break in 75 ft intervals, pt with decreased awareness of regulating rest breaks and became frustrated with breathing stating "am I going to breathe like this for the rest of my life?"   Stairs             Wheelchair Mobility    Modified Rankin (Stroke Patients Only)       Balance Overall balance assessment: Needs assistance;History of Falls   Sitting balance-Leahy Scale: Good       Standing balance-Leahy Scale: Poor Standing balance comment: reliant on external support in standing                            Cognition Arousal/Alertness: Awake/alert Behavior During Therapy: Anxious Overall Cognitive Status: Impaired/Different from baseline Area of Impairment: Safety/judgement;Problem solving;Memory;Following commands                 Orientation Level: Situation Current Attention Level: Selective Memory: Decreased short-term memory Following Commands: Follows one step commands consistently Safety/Judgement: Decreased awareness of safety;Decreased awareness of deficits   Problem Solving: Decreased initiation;Requires verbal cues;Requires tactile cues General Comments: anxious with mobility and with depressed affect  about current situation, eager to d/c to CIR      Exercises      General Comments        Pertinent Vitals/Pain Pain Assessment: Faces Faces Pain Scale: Hurts a little bit Pain  Location: back, ribs Pain Descriptors / Indicators: Discomfort Pain Intervention(s): Limited activity within patient's tolerance;Monitored during session;Repositioned    Home Living                      Prior Function            PT Goals (current goals can now be found in the care plan section) Acute Rehab PT Goals Patient Stated Goal: go to CIR PT Goal Formulation: With patient/family Time For Goal Achievement: 03/29/20 Potential to Achieve Goals: Good Progress towards PT goals: Progressing toward goals    Frequency    Min 3X/week      PT Plan Current plan remains appropriate    Co-evaluation              AM-PAC PT "6 Clicks" Mobility   Outcome Measure  Help needed turning from your back to your side while in a flat bed without using bedrails?: A Little Help needed moving from lying on your back to sitting on the side of a flat bed without using bedrails?: A Little Help needed moving to and from a bed to a chair (including a wheelchair)?: A Little Help needed standing up from a chair using your arms (e.g., wheelchair or bedside chair)?: A Little Help needed to walk in hospital room?: A Little Help needed climbing 3-5 steps with a railing? : A Lot 6 Click Score: 17    End of Session Equipment Utilized During Treatment: Oxygen;Gait belt Activity Tolerance: Patient limited by fatigue Patient left: in bed;with family/visitor present;with call bell/phone within reach;with bed alarm set Nurse Communication: Mobility status PT Visit Diagnosis: Muscle weakness (generalized) (M62.81);History of falling (Z91.81);Difficulty in walking, not elsewhere classified (R26.2) Pain - part of body: (and R ribcage at fx sites)     Time: 1173-5670 PT Time Calculation (min) (ACUTE ONLY): 28 min  Charges:  $Gait Training: 8-22 mins $Therapeutic Activity: 8-22 mins                    Raymond Azure E, [place] Pager [phone]  Office  [phone]   Norvil Martensen D Elonda Husky 03/24/2020, 4:29 PM

## 2020-03-25 ENCOUNTER — Encounter: Payer: Self-pay | Admitting: Internal Medicine

## 2020-03-25 ENCOUNTER — Inpatient Hospital Stay (HOSPITAL_COMMUNITY)
Admission: RE | Admit: 2020-03-25 | Discharge: 2020-04-01 | DRG: 945 | Disposition: A | Discharge status: Home Health | Payer: Medicare Other | Source: Intra-hospital | Attending: Physical Medicine & Rehabilitation | Admitting: Physical Medicine & Rehabilitation

## 2020-03-25 ENCOUNTER — Other Ambulatory Visit: Payer: Self-pay

## 2020-03-25 ENCOUNTER — Encounter (HOSPITAL_COMMUNITY): Payer: Self-pay | Admitting: Internal Medicine

## 2020-03-25 ENCOUNTER — Inpatient Hospital Stay (HOSPITAL_COMMUNITY)
Admission: RE | Admit: 2020-03-25 | Payer: Medicare Other | Source: Intra-hospital | Admitting: Physical Medicine & Rehabilitation

## 2020-03-25 DIAGNOSIS — R0602 Shortness of breath: Secondary | ICD-10-CM

## 2020-03-25 DIAGNOSIS — Z801 Family history of malignant neoplasm of trachea, bronchus and lung: Secondary | ICD-10-CM

## 2020-03-25 DIAGNOSIS — Z8616 Personal history of COVID-19: Secondary | ICD-10-CM

## 2020-03-25 DIAGNOSIS — Z681 Body mass index (BMI) 19 or less, adult: Secondary | ICD-10-CM | POA: Diagnosis not present

## 2020-03-25 DIAGNOSIS — Z79899 Other long term (current) drug therapy: Secondary | ICD-10-CM

## 2020-03-25 DIAGNOSIS — Z87891 Personal history of nicotine dependence: Secondary | ICD-10-CM

## 2020-03-25 DIAGNOSIS — I10 Essential (primary) hypertension: Secondary | ICD-10-CM | POA: Diagnosis present

## 2020-03-25 DIAGNOSIS — Z79890 Hormone replacement therapy (postmenopausal): Secondary | ICD-10-CM

## 2020-03-25 DIAGNOSIS — R296 Repeated falls: Secondary | ICD-10-CM | POA: Diagnosis present

## 2020-03-25 DIAGNOSIS — Z79891 Long term (current) use of opiate analgesic: Secondary | ICD-10-CM

## 2020-03-25 DIAGNOSIS — R490 Dysphonia: Secondary | ICD-10-CM | POA: Diagnosis present

## 2020-03-25 DIAGNOSIS — E876 Hypokalemia: Secondary | ICD-10-CM | POA: Diagnosis present

## 2020-03-25 DIAGNOSIS — S2241XD Multiple fractures of ribs, right side, subsequent encounter for fracture with routine healing: Secondary | ICD-10-CM

## 2020-03-25 DIAGNOSIS — M19012 Primary osteoarthritis, left shoulder: Secondary | ICD-10-CM | POA: Diagnosis present

## 2020-03-25 DIAGNOSIS — Z7189 Other specified counseling: Secondary | ICD-10-CM

## 2020-03-25 DIAGNOSIS — C3490 Malignant neoplasm of unspecified part of unspecified bronchus or lung: Secondary | ICD-10-CM | POA: Diagnosis present

## 2020-03-25 DIAGNOSIS — I4819 Other persistent atrial fibrillation: Secondary | ICD-10-CM | POA: Diagnosis present

## 2020-03-25 DIAGNOSIS — G4700 Insomnia, unspecified: Secondary | ICD-10-CM | POA: Diagnosis present

## 2020-03-25 DIAGNOSIS — Z833 Family history of diabetes mellitus: Secondary | ICD-10-CM

## 2020-03-25 DIAGNOSIS — Z933 Colostomy status: Secondary | ICD-10-CM

## 2020-03-25 DIAGNOSIS — B3749 Other urogenital candidiasis: Secondary | ICD-10-CM | POA: Diagnosis not present

## 2020-03-25 DIAGNOSIS — Z515 Encounter for palliative care: Secondary | ICD-10-CM

## 2020-03-25 DIAGNOSIS — R64 Cachexia: Secondary | ICD-10-CM | POA: Diagnosis present

## 2020-03-25 DIAGNOSIS — F329 Major depressive disorder, single episode, unspecified: Secondary | ICD-10-CM | POA: Diagnosis present

## 2020-03-25 DIAGNOSIS — Y92239 Unspecified place in hospital as the place of occurrence of the external cause: Secondary | ICD-10-CM | POA: Diagnosis present

## 2020-03-25 DIAGNOSIS — D6481 Anemia due to antineoplastic chemotherapy: Secondary | ICD-10-CM | POA: Diagnosis present

## 2020-03-25 DIAGNOSIS — Z9981 Dependence on supplemental oxygen: Secondary | ICD-10-CM

## 2020-03-25 DIAGNOSIS — M109 Gout, unspecified: Secondary | ICD-10-CM | POA: Diagnosis present

## 2020-03-25 DIAGNOSIS — E871 Hypo-osmolality and hyponatremia: Secondary | ICD-10-CM | POA: Diagnosis present

## 2020-03-25 DIAGNOSIS — Z66 Do not resuscitate: Secondary | ICD-10-CM

## 2020-03-25 DIAGNOSIS — F419 Anxiety disorder, unspecified: Secondary | ICD-10-CM | POA: Diagnosis present

## 2020-03-25 DIAGNOSIS — J189 Pneumonia, unspecified organism: Secondary | ICD-10-CM

## 2020-03-25 DIAGNOSIS — R5381 Other malaise: Secondary | ICD-10-CM | POA: Diagnosis not present

## 2020-03-25 DIAGNOSIS — M549 Dorsalgia, unspecified: Secondary | ICD-10-CM | POA: Diagnosis present

## 2020-03-25 DIAGNOSIS — I482 Chronic atrial fibrillation, unspecified: Secondary | ICD-10-CM

## 2020-03-25 DIAGNOSIS — Z7901 Long term (current) use of anticoagulants: Secondary | ICD-10-CM

## 2020-03-25 DIAGNOSIS — E43 Unspecified severe protein-calorie malnutrition: Secondary | ICD-10-CM | POA: Diagnosis present

## 2020-03-25 DIAGNOSIS — Z8249 Family history of ischemic heart disease and other diseases of the circulatory system: Secondary | ICD-10-CM

## 2020-03-25 DIAGNOSIS — Z7952 Long term (current) use of systemic steroids: Secondary | ICD-10-CM

## 2020-03-25 DIAGNOSIS — D72829 Elevated white blood cell count, unspecified: Secondary | ICD-10-CM | POA: Diagnosis present

## 2020-03-25 DIAGNOSIS — I313 Pericardial effusion (noninflammatory): Secondary | ICD-10-CM | POA: Diagnosis present

## 2020-03-25 DIAGNOSIS — F1021 Alcohol dependence, in remission: Secondary | ICD-10-CM | POA: Diagnosis present

## 2020-03-25 DIAGNOSIS — R627 Adult failure to thrive: Secondary | ICD-10-CM | POA: Diagnosis present

## 2020-03-25 DIAGNOSIS — Z808 Family history of malignant neoplasm of other organs or systems: Secondary | ICD-10-CM

## 2020-03-25 DIAGNOSIS — Z9181 History of falling: Secondary | ICD-10-CM

## 2020-03-25 DIAGNOSIS — Z923 Personal history of irradiation: Secondary | ICD-10-CM

## 2020-03-25 DIAGNOSIS — G8929 Other chronic pain: Secondary | ICD-10-CM | POA: Diagnosis present

## 2020-03-25 DIAGNOSIS — W19XXXD Unspecified fall, subsequent encounter: Secondary | ICD-10-CM | POA: Diagnosis present

## 2020-03-25 DIAGNOSIS — Z886 Allergy status to analgesic agent status: Secondary | ICD-10-CM

## 2020-03-25 DIAGNOSIS — K219 Gastro-esophageal reflux disease without esophagitis: Secondary | ICD-10-CM | POA: Diagnosis present

## 2020-03-25 DIAGNOSIS — S2249XA Multiple fractures of ribs, unspecified side, initial encounter for closed fracture: Secondary | ICD-10-CM

## 2020-03-25 DIAGNOSIS — T380X5A Adverse effect of glucocorticoids and synthetic analogues, initial encounter: Secondary | ICD-10-CM | POA: Diagnosis not present

## 2020-03-25 DIAGNOSIS — B370 Candidal stomatitis: Secondary | ICD-10-CM | POA: Diagnosis present

## 2020-03-25 DIAGNOSIS — Z96642 Presence of left artificial hip joint: Secondary | ICD-10-CM | POA: Diagnosis present

## 2020-03-25 DIAGNOSIS — N400 Enlarged prostate without lower urinary tract symptoms: Secondary | ICD-10-CM | POA: Diagnosis present

## 2020-03-25 LAB — BASIC METABOLIC PANEL
Anion gap: 9 (ref 5–15)
BUN: 19 mg/dL (ref 8–23)
CO2: 27 mmol/L (ref 22–32)
Calcium: 8.8 mg/dL — ABNORMAL LOW (ref 8.9–10.3)
Chloride: 95 mmol/L — ABNORMAL LOW (ref 98–111)
Creatinine, Ser: 0.55 mg/dL — ABNORMAL LOW (ref 0.61–1.24)
GFR calc Af Amer: >60 mL/min (ref >60)
GFR calc non Af Amer: >60 mL/min (ref >60)
Glucose, Bld: 101 mg/dL — ABNORMAL HIGH (ref 70–99)
Potassium: 3.7 mmol/L (ref 3.5–5.1)
Sodium: 131 mmol/L — ABNORMAL LOW (ref 135–145)

## 2020-03-25 MED ORDER — TAMSULOSIN HCL 0.4 MG PO CAPS
0.4000 mg | ORAL_CAPSULE | Freq: Two times a day (BID) | ORAL | Status: DC
  Administered 2020-03-25 – 2020-04-01 (×14): 0.4 mg via ORAL
  Filled 2020-03-25 (×14): qty 1

## 2020-03-25 MED ORDER — PROCHLORPERAZINE 25 MG RE SUPP: 12.5000 mg | Freq: Four times a day (QID) | RECTAL | Status: DC | PRN

## 2020-03-25 MED ORDER — FLEET ENEMA 7-19 GM/118ML RE ENEM: 1.0000 | ENEMA | Freq: Once | RECTAL | Status: DC | PRN

## 2020-03-25 MED ORDER — SACCHAROMYCES BOULARDII 250 MG PO CAPS
250.0000 mg | ORAL_CAPSULE | Freq: Two times a day (BID) | ORAL | Status: DC
  Administered 2020-03-25 – 2020-04-01 (×14): 250 mg via ORAL
  Filled 2020-03-25 (×14): qty 1

## 2020-03-25 MED ORDER — COLCHICINE 0.6 MG PO TABS
0.6000 mg | ORAL_TABLET | Freq: Two times a day (BID) | ORAL | Status: DC
  Administered 2020-03-25 – 2020-03-30 (×10): 0.6 mg via ORAL
  Filled 2020-03-25 (×10): qty 1

## 2020-03-25 MED ORDER — LEVOTHYROXINE SODIUM 25 MCG PO TABS
50.0000 ug | ORAL_TABLET | Freq: Every day | ORAL | Status: DC
  Administered 2020-03-27 – 2020-04-01 (×6): 50 ug via ORAL
  Filled 2020-03-25 (×7): qty 2

## 2020-03-25 MED ORDER — LIDOCAINE 5 % EX PTCH: 1.0000 | MEDICATED_PATCH | CUTANEOUS | 0 refills | Status: DC

## 2020-03-25 MED ORDER — IPRATROPIUM-ALBUTEROL 0.5-2.5 (3) MG/3ML IN SOLN: 3.0000 mL | Freq: Four times a day (QID) | RESPIRATORY_TRACT | Status: DC | PRN

## 2020-03-25 MED ORDER — MENTHOL 3 MG MT LOZG: 1.0000 | LOZENGE | OROMUCOSAL | 12 refills | Status: DC | PRN

## 2020-03-25 MED ORDER — LIDOCAINE 5 % EX PTCH
1.0000 | MEDICATED_PATCH | CUTANEOUS | Status: DC
  Administered 2020-03-25 – 2020-03-29 (×5): 1 via TRANSDERMAL
  Filled 2020-03-25 (×7): qty 1

## 2020-03-25 MED ORDER — PROCHLORPERAZINE MALEATE 5 MG PO TABS: 5.0000 mg | ORAL_TABLET | Freq: Four times a day (QID) | ORAL | Status: DC | PRN

## 2020-03-25 MED ORDER — MAGIC MOUTHWASH
10.0000 mL | Freq: Four times a day (QID) | ORAL | Status: DC
  Administered 2020-03-25 – 2020-04-01 (×23): 10 mL via ORAL
  Filled 2020-03-25 (×29): qty 10

## 2020-03-25 MED ORDER — PREDNISONE 20 MG PO TABS: 60.0000 mg | ORAL_TABLET | Freq: Every day | ORAL | Status: DC

## 2020-03-25 MED ORDER — DIPHENHYDRAMINE HCL 12.5 MG/5ML PO ELIX: 12.5000 mg | ORAL_SOLUTION | Freq: Four times a day (QID) | ORAL | Status: DC | PRN

## 2020-03-25 MED ORDER — GUAIFENESIN-DM 100-10 MG/5ML PO SYRP: 5.0000 mL | ORAL_SOLUTION | Freq: Four times a day (QID) | ORAL | Status: DC | PRN

## 2020-03-25 MED ORDER — PREDNISONE 20 MG PO TABS
50.0000 mg | ORAL_TABLET | Freq: Every day | ORAL | Status: DC
  Administered 2020-03-26: 50 mg via ORAL
  Filled 2020-03-25: qty 2

## 2020-03-25 MED ORDER — POLYETHYLENE GLYCOL 3350 17 G PO PACK: 17.0000 g | PACK | Freq: Every day | ORAL | Status: DC | PRN

## 2020-03-25 MED ORDER — DM-GUAIFENESIN ER 30-600 MG PO TB12
1.0000 | ORAL_TABLET | Freq: Two times a day (BID) | ORAL | Status: DC
  Administered 2020-03-25 – 2020-04-01 (×14): 1 via ORAL
  Filled 2020-03-25 (×14): qty 1

## 2020-03-25 MED ORDER — FLUCONAZOLE 100 MG PO TABS
100.0000 mg | ORAL_TABLET | Freq: Every day | ORAL | Status: AC
  Administered 2020-03-26: 100 mg via ORAL
  Filled 2020-03-25: qty 1

## 2020-03-25 MED ORDER — POLYETHYLENE GLYCOL 3350 17 G PO PACK: 17.0000 g | PACK | Freq: Every day | ORAL | 0 refills | Status: DC | PRN

## 2020-03-25 MED ORDER — SALINE SPRAY 0.65 % NA SOLN
1.0000 | NASAL | Status: DC | PRN
  Filled 2020-03-25: qty 44

## 2020-03-25 MED ORDER — GUAIFENESIN-DM 100-10 MG/5ML PO SYRP
5.0000 mL | ORAL_SOLUTION | ORAL | Status: DC | PRN
  Administered 2020-03-26 (×2): 5 mL via ORAL
  Filled 2020-03-25 (×2): qty 5

## 2020-03-25 MED ORDER — TRAZODONE HCL 50 MG PO TABS
25.0000 mg | ORAL_TABLET | Freq: Every evening | ORAL | Status: DC | PRN
  Administered 2020-03-25 – 2020-03-26 (×2): 25 mg via ORAL
  Filled 2020-03-25 (×2): qty 1

## 2020-03-25 MED ORDER — PROCHLORPERAZINE EDISYLATE 10 MG/2ML IJ SOLN: 5.0000 mg | Freq: Four times a day (QID) | INTRAMUSCULAR | Status: DC | PRN

## 2020-03-25 MED ORDER — HYDROCODONE-ACETAMINOPHEN 5-325 MG PO TABS: 1.0000 | ORAL_TABLET | ORAL | Status: DC | PRN

## 2020-03-25 MED ORDER — DILTIAZEM HCL ER COATED BEADS 180 MG PO CP24
180.0000 mg | ORAL_CAPSULE | Freq: Every day | ORAL | Status: DC
  Administered 2020-03-26 – 2020-04-01 (×7): 180 mg via ORAL
  Filled 2020-03-25 (×7): qty 1

## 2020-03-25 MED ORDER — DEMECLOCYCLINE HCL 150 MG PO TABS
300.0000 mg | ORAL_TABLET | Freq: Two times a day (BID) | ORAL | Status: DC
  Administered 2020-03-25 – 2020-04-01 (×14): 300 mg via ORAL
  Filled 2020-03-25 (×15): qty 2

## 2020-03-25 MED ORDER — SALINE SPRAY 0.65 % NA SOLN: 1.0000 | NASAL | 0 refills | Status: DC | PRN

## 2020-03-25 MED ORDER — ACETAMINOPHEN 325 MG PO TABS
325.0000 mg | ORAL_TABLET | ORAL | Status: DC | PRN
  Filled 2020-03-25: qty 2

## 2020-03-25 MED ORDER — FLUCONAZOLE 100 MG PO TABS: 100.0000 mg | ORAL_TABLET | Freq: Every day | ORAL | Status: DC

## 2020-03-25 MED ORDER — ALUM & MAG HYDROXIDE-SIMETH 200-200-20 MG/5ML PO SUSP: 30.0000 mL | ORAL | Status: DC | PRN

## 2020-03-25 MED ORDER — DEMECLOCYCLINE HCL 150 MG PO TABS: 300.0000 mg | ORAL_TABLET | Freq: Two times a day (BID) | ORAL | Status: DC

## 2020-03-25 MED ORDER — CHLORHEXIDINE GLUCONATE CLOTH 2 % EX PADS: 6.0000 | MEDICATED_PAD | Freq: Every day | CUTANEOUS | Status: DC

## 2020-03-25 MED ORDER — BOOST PLUS PO LIQD
237.0000 mL | Freq: Two times a day (BID) | ORAL | Status: DC
  Administered 2020-03-26 – 2020-04-01 (×6): 237 mL via ORAL
  Filled 2020-03-25 (×16): qty 237

## 2020-03-25 MED ORDER — MENTHOL 3 MG MT LOZG: 1.0000 | LOZENGE | OROMUCOSAL | Status: DC | PRN

## 2020-03-25 MED ORDER — APIXABAN 5 MG PO TABS
5.0000 mg | ORAL_TABLET | Freq: Two times a day (BID) | ORAL | Status: DC
  Administered 2020-03-25 – 2020-04-01 (×14): 5 mg via ORAL
  Filled 2020-03-25 (×14): qty 1

## 2020-03-25 MED ORDER — DM-GUAIFENESIN ER 30-600 MG PO TB12: 1.0000 | ORAL_TABLET | Freq: Two times a day (BID) | ORAL | Status: DC

## 2020-03-25 MED ORDER — BISACODYL 5 MG PO TBEC: 5.0000 mg | DELAYED_RELEASE_TABLET | Freq: Every day | ORAL | 0 refills | Status: DC | PRN

## 2020-03-25 MED ORDER — ENSURE ENLIVE PO LIQD
237.0000 mL | Freq: Three times a day (TID) | ORAL | Status: DC
  Administered 2020-03-25 – 2020-04-01 (×20): 237 mL via ORAL

## 2020-03-25 MED ORDER — SALINE SPRAY 0.65 % NA SOLN
1.0000 | Freq: Three times a day (TID) | NASAL | Status: DC
  Administered 2020-03-25 – 2020-04-01 (×19): 1 via NASAL
  Filled 2020-03-25: qty 44

## 2020-03-25 MED ORDER — GUAIFENESIN-DM 100-10 MG/5ML PO SYRP: 5.0000 mL | ORAL_SOLUTION | ORAL | 0 refills | Status: DC | PRN

## 2020-03-25 MED ORDER — BISACODYL 10 MG RE SUPP: 10.0000 mg | Freq: Every day | RECTAL | Status: DC | PRN

## 2020-03-25 MED ORDER — DOCUSATE SODIUM 100 MG PO CAPS: 100.0000 mg | ORAL_CAPSULE | Freq: Two times a day (BID) | ORAL | 0 refills | Status: DC

## 2020-03-25 MED ORDER — ALPRAZOLAM 0.25 MG PO TABS
0.2500 mg | ORAL_TABLET | Freq: Every evening | ORAL | Status: DC | PRN
  Administered 2020-03-26: 0.25 mg via ORAL
  Filled 2020-03-25 (×2): qty 1

## 2020-03-25 NOTE — Discharge Summary (Signed)
Physician Discharge Summary  Darcy Cordner GLO:[id number] DOB: 01/12/1941 DOA: 03/13/2020  PCP: Patient, No Pcp Per  Admit date: 03/13/2020 Discharge date: 03/25/2020  Admitted From: Home Discharge disposition: CIR   Code Status: DNR  Diet Recommendation: Regular diet as tolerated.   Recommendations for Outpatient Follow-Up:   1. Follow-up with PCP as an outpatient 2. Follow-up with urology as an outpatient  Discharge Diagnosis:   Active Problems:   Hyponatremia   Stage 4 lung cancer, unspecified laterality (HCC)   Pericardial effusion   Atrial fibrillation, chronic (HCC)   Fall (on)(from) sidewalk curb, initial encounter   Multiple fractures of rib involving four or more ribs   Diverticulitis   Hypothyroidism (acquired)   Pressure injury of skin    History of Present Illness / Brief narrative:  Patient is an  79 year old male with history of chronic A. 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 Patient presented to ED on 4/9 with a fall of from his wheelchair.  He has been chronically declining and has ongoing issues with fatigue, poor appetite and dizziness.  Patient lost balance and fell without losing consciousness.  In the ED, CT head was normal. Sodium level was found to be low at 124 probably contributing to his weakness. CT chest was obtained as well which was concerning for worsening pericardial effusion. Echocardiogram was obtained which confirmed pericardial effusion but did not have tamponade physiology.  Patient was admitted to hospital service for further evaluation management.  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  -He is currently on demeclocycline and fluid restriction in his sodium has trended up and overall has remained stable varying between 129-130 for the past several days. -Continue demeclocycline.  Chronic malignant pericardial effusion  -CT chest was obtained as well which was concerning for worsening pericardial effusion. -Echocardiogram was obtained which confirmed pericardial effusion but did not have tamponade physiology. -Cardiology consultation was obtained.  No need of further study at this time.  Follow-up as an outpatient.   Yeast UTI -Patient complained of dysuria and discomfort. -Urine culture grew more than 100,000 CFU per mL of yeast. -Started on fluconazole on 4/16.3 more days to complete the course.   -Symptoms resolved.  Physical deconditioning -Secondary to malignancy. -PT eval was obtained. -Accepted at CIR today.  Aspiration pneumonitis -Completed 7 days of Augmentin.  Acute hypoxic respiratory failure -Multifactorial : Progression of lung cancer, aspiration pneumonia and Covid pneumonitis -Currently on room air at rest.  Needs 2 to 3 L on ambulation.  Stage 4 lung cancer, unspecified laterality ([place]) -Cancer diagnosed in 2014,  -chemotherapy is on hold since December 2020. This was held due to his weakness cachexia and fragility.  -Oncology follow-up appreciated.  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  Started on colchicine. Plans were in place for arthrocentesis however his Eliquis would have needed to be held. Patient did not want to stop Eliquis so knee was not tapped. Now his pain has improved, better, able to work much better with physical therapy.  Right posterior rib  fracture -Likely because of his back pain continue  Vicodin, morphine and lidocaine patch.  Hypothyroidism -Continue Synthroid  Recent left femoral neck fracture -Status post left hip hemiarthroplasty by orthopedic surgery on 02/18/2020  History of perforated diverticulitis January 2021 -Status post sigmoid colectomy and colostomy creation by general surgery on 01/05/2020  Recent COVID-19 pneumonia with concern for post Covid pneumonitis -With hypoxic respiratory failure, he was hospitalized here and finished remdesivir on 2/4.  Patient may have degree of post Covid pneumonitis. Continue steroids, will need a prolonged taper.  History of ischemia to left hand-s/p left subclavian axillary brachial radial ulnar embolectomy on 2/1 by vascular surgery -Currently on Eliquis  Urinary retention Chronic indwelling Foley catheter. -Failed voiding trial in the hospital. -Continue Flomax. -follow-up with urology as an outpatient.  Chronic atrial fibrillation -continue Eliquis he is currently rate controlled  Pressure injury of skinCoccyxmidline, POA RN Pressure Injury Documentation Pressure Injury 03/13/20 Coccyx Mid Stage 1 -  Intact skin with non-blanchable redness of a localized area usually over a bony prominence. quarter sized pink area over coccyx (Active)  03/13/20 1830  Location: Coccyx  Location Orientation: Mid  Staging: Stage 1 -  Intact skin with non-blanchable redness of a localized area usually over a bony prominence.  Wound Description (Comments): quarter sized pink area over coccyx  Present on Admission: Yes   Stable for discharge to CIR today.  Subjective:  Seen and examined this morning.  Elderly Caucasian male propped up in bed.  Not in distress.  Not on supplemental oxygen at rest.  Complains of dry nose.  Asked for saline spray.  Discharge Exam:   Vitals:   03/24/20 0758 03/24/20 2303 03/25/20 0627 03/25/20 0802  BP: (!) 154/68 (!) 165/75 (!) 162/61 140/68  Pulse: 66 67  73  Resp: 16 15  18   Temp: 97.8 F  (36.6 C) 97.6 F (36.4 C)  97.8 F (36.6 C)  TempSrc: Oral Oral  Oral  SpO2: 93% 100%  93%  Weight:   60.8 kg   Height:        Body mass index is 18.69 kg/m.  General exam: Appears calm and comfortable.  Skin: No rashes, lesions or ulcers. HEENT: Atraumatic, normocephalic, supple neck, no obvious bleeding Lungs: Clear to auscultation bilaterally except for mild basilar rales CVS: Regular rate and rhythm, no murmur GI/Abd soft, nontender, nondistended, bowel sound present CNS: Alert, awake monitor x3 Psychiatry: Mood appropriate Extremities: No pedal edema, no calf tenderness  Discharge Instructions:  Wound care: As mentioned above Discharge Instructions    Diet general   Complete by: As directed    Increase activity slowly   Complete by: As directed      Follow-up Information    Marchia Bond, MD .   Specialty: Orthopedic Surgery Contact information: [place] [address] [phone]        [facility] Follow up.   Why: Voiding trial. Contact information: [place] [phone]         Allergies as of 03/25/2020      Reactions   Aspirin Other (See Comments)   Cannot consume as a result of him taking Eliquis (Blood thinner)      Medication List    TAKE these medications   ALPRAZolam 0.25 MG tablet Commonly known as: XANAX Take 0.25 mg by mouth at bedtime as needed for anxiety.   bisacodyl 5 MG EC tablet Commonly known as:  DULCOLAX Take 1 tablet (5 mg total) by mouth daily as needed for moderate constipation.   demeclocycline 150 MG tablet Commonly known as: DECLOMYCIN Take 2 tablets (300 mg total) by mouth 2 (two) times daily. What changed: how much to take   dextromethorphan-guaiFENesin 30-600 MG 12hr tablet Commonly known as: MUCINEX DM Take 1 tablet by mouth 2 (two) times daily.   guaiFENesin-dextromethorphan 100-10 MG/5ML syrup Commonly known  as: ROBITUSSIN DM Take 5 mLs by mouth every 4 (four) hours as needed for cough (chest congestion).   diltiazem 180 MG 24 hr capsule Commonly known as: DILACOR XR Take 180 mg by mouth daily.   docusate sodium 100 MG capsule Commonly known as: COLACE Take 1 capsule (100 mg total) by mouth 2 (two) times daily.   Eliquis 5 MG Tabs tablet Generic drug: apixaban Take 5 mg by mouth 2 (two) times daily.   fluconazole 100 MG tablet Commonly known as: DIFLUCAN Take 1 tablet (100 mg total) by mouth daily for 3 days. Start taking on: March 26, 2020   HYDROcodone-acetaminophen 5-325 MG tablet Commonly known as: NORCO/VICODIN Take 1 tablet by mouth 3 (three) times daily as needed for moderate pain.   levothyroxine 50 MCG tablet Commonly known as: SYNTHROID Take 50 mcg by mouth daily before breakfast.   lidocaine 5 % Commonly known as: LIDODERM Place 1 patch onto the skin daily. Remove & Discard patch within 12 hours or as directed by MD   menthol-cetylpyridinium 3 MG lozenge Commonly known as: CEPACOL Take 1 lozenge (3 mg total) by mouth as needed for sore throat.   mirtazapine 30 MG tablet Commonly known as: REMERON Take 30 mg by mouth at bedtime.   OPDIVO IV Inject into the vein See admin instructions. Inject as directed every month.   polyethylene glycol 17 g packet Commonly known as: MIRALAX / GLYCOLAX Take 17 g by mouth daily as needed for mild constipation.   potassium chloride SA 20 MEQ tablet Commonly known as: KLOR-CON Take 40 mEq by mouth 2 (two) times daily.   predniSONE 20 MG tablet Commonly known as: DELTASONE Take 3 tablets (60 mg total) by mouth daily with breakfast. Start taking on: March 26, 2020   sodium chloride 0.65 % Soln nasal spray Commonly known as: OCEAN Place 1 spray into both nostrils as needed for congestion.   tamsulosin 0.4 MG Caps capsule Commonly known as: FLOMAX Take 0.4 mg by mouth in the morning and at bedtime.       Time  coordinating discharge: 35 minutes  The results of significant diagnostics from this hospitalization (including imaging, microbiology, ancillary and laboratory) are listed below for reference.    Procedures and Diagnostic Studies:   DG Chest 2 View  Result Date: 03/13/2020 CLINICAL DATA:  Ground level fall. Prior diagnosis of pneumonia 3-4 weeks ago with persistent cough. EXAM: CHEST - 2 VIEW COMPARISON:  02/22/2020 and 02/24/2020 chest radiograph. FINDINGS: Emphysema. Decreased density of left basilar/retrocardiac consolidative opacity. Mid to lower lung right consolidative opacities are less conspicuous. No pneumothorax. Trace left pleural effusion, less conspicuous than prior exam. Partially obscured cardiomediastinal silhouette. Aortic arch atherosclerotic calcifications. Sequela of left shoulder arthroplasty. Chronic multilevel right rib fracture deformities. Multilevel spondylosis. IMPRESSION: Decreased density of left basilar/retrocardiac consolidation. Trace left pleural effusion. Right mid to lower lung opacities are less conspicuous. Electronically Signed   By: Primitivo Gauze M.D.   On: 03/13/2020 10:41   CT Head Wo Contrast  Result Date: 03/13/2020 CLINICAL DATA:  [place]. Facial  trauma. EXAM: CT HEAD WITHOUT CONTRAST CT MAXILLOFACIAL WITHOUT CONTRAST CT CERVICAL SPINE WITHOUT CONTRAST TECHNIQUE: Multidetector CT imaging of the head, cervical spine, and maxillofacial structures were performed using the standard protocol without intravenous contrast. Multiplanar CT image reconstructions of the cervical spine and maxillofacial structures were also generated. COMPARISON:  MRI brain 06/30/2019 and head CT from 2015. FINDINGS: CT HEAD FINDINGS Brain: Stable age related cerebral atrophy, ventriculomegaly and periventricular white matter disease. Scattered basal ganglia calcifications. No extra-axial fluid collections are identified. No CT findings for acute hemispheric infarction or intracranial  hemorrhage. No mass lesions. The brainstem and cerebellum are normal. Vascular: Vascular calcifications but no aneurysm or hyperdense vessels. Skull: No skull fracture or bone lesions. Other: No scalp lesions or hematoma. CT MAXILLOFACIAL FINDINGS Osseous: Exam limited by patient motion but no definite facial bone fractures are identified. The mandibular condyles are normally located. No obvious mandible fracture. Orbits: The bony orbits are intact. Of the globes are intact. Sinuses: Patchy ethmoid sinus disease and partial opacification of the right half of the sphenoid sinus. The right maxillary sinus contains a mucous retention cyst or polyp. Soft tissues: No significant soft tissue findings. CT CERVICAL SPINE FINDINGS Alignment: Normal overall alignment. There is severe degenerative cervical spondylosis with multilevel disc disease and facet disease and mild multilevel degenerative subluxations. Skull base and vertebrae: Exam limited by motion artifact but no definite acute fractures. Soft tissues and spinal canal: No prevertebral fluid or swelling. No visible canal hematoma. Disc levels: The spinal canal is patent. Mild multilevel spinal and foraminal stenosis but no large disc protrusions. Upper chest: Emphysematous changes are noted at the lung apices with dense scarring changes. Other: Carotid artery calcifications are noted. IMPRESSION: 1. Stable age related cerebral atrophy, ventriculomegaly and periventricular white matter disease. 2. No acute intracranial findings or skull fracture. 3. No definite acute facial bone fractures. 4. Severe degenerative cervical spondylosis with multilevel disc disease and facet disease but no definite acute cervical spine fracture. Electronically Signed   By: Marijo Sanes M.D.   On: 03/13/2020 10:09   CT CHEST W CONTRAST  Addendum Date: 03/13/2020   ADDENDUM REPORT: 03/13/2020 19:50 ADDENDUM: Signs of interval left hip arthroplasty. Fluid about the arthroplasty may be  postoperative given recent placement. Correlate with any clinical signs of infection. Electronically Signed   By: Zetta Bills M.D.   On: 03/13/2020 19:50   Result Date: 03/13/2020 CLINICAL DATA:  Small cell lung cancer assess treatment response. Also experience ground level fall, on Eliquis. EXAM: CT CHEST, ABDOMEN, AND PELVIS WITH CONTRAST TECHNIQUE: Multidetector CT imaging of the chest, abdomen and pelvis was performed following the standard protocol during bolus administration of intravenous contrast. CONTRAST:  174mL OMNIPAQUE IOHEXOL 300 MG/ML  SOLN COMPARISON:  01/23/2020 FINDINGS: CT CHEST FINDINGS Cardiovascular: Calcified and noncalcified atherosclerotic plaque throughout the aorta its branches in the chest. No aneurysmal dilation of thoracic aorta. Are stable in terms of mild dilation. Enlarging pericardial effusion, 2 cm thickness along the mid cardiac contour previously approximately 1 cm. Mediastinum/Nodes: Limited assessment of thoracic inlet structures due to motion artifact. No axillary lymphadenopathy. No hilar lymphadenopathy. No mediastinal lymphadenopathy. Esophagus grossly normal. Lungs/Pleura: Bronchial wall thickening at the left hilum with signs of lower lobe bronchiectasis, this is developed since the previous study. Small left-sided effusion is enlarged. Small right effusion with basilar volume loss. Multifocal areas of ground-glass nodularity within aerated portions of the chest and areas of part solid nodules, similar to the prior study. (Image 47, series 4)  2.8 x 1.8 cm, previously 2.9 x 1.6 cm. Solid component appears similar. Ground-glass with increased soft tissue density with subpleural bandlike opacity that has developed amidst ground-glass changes in the right upper lobe (image 57, series 4) 1.7 by 0.7 cm, no solid-appearing abnormality was present in this location on the previous exam. Pure ground-glass lesion in the left upper lobe shows no solid component on today's study  measuring approximately 2.4 cm in greatest axial dimension appearing more conspicuous but unchanged in terms of size. Areas of volume loss and opacity in the right lower lobe show increasing density along the superior aspect in the superior segment of the right lower lobe (image 78, series 4) 2.5 x 1.6 cm, this area previously measured approximately 2 point 2 by 1.2 cm. Background pulmonary emphysema. Musculoskeletal: No chest wall mass. See below for full musculoskeletal details. CT ABDOMEN PELVIS FINDINGS Hepatobiliary: Small hyperdense focus in the dome of the right hemi liver approximately cm unchanged likely a small hemangioma. Low-density focus along the margin of the inferior right hemi liver anteriorly (image 63, series 7) 8 mm, unchanged as is another area of low-density in hepatic subsegment VI measuring 15 mm. Pancreas: Pancreas is normal without focal lesion or ductal dilation. Spleen: Spleen is normal size without focal lesion. Adrenals/Urinary Tract: Adrenal glands with mild thickening of the right adrenal gland unchanged. Left adrenal gland is normal. Renal enhancement is symmetric with some scarring along the lateral cortex of the right kidney. Small low-density foci in the left kidney are likely small cysts and unchanged. No hydronephrosis. Stomach/Bowel: No acute small bowel process. 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 Stool fills much of the colon. The appendix is normal. Vascular/Lymphatic: Calcified and noncalcified plaque of the abdominal aorta without aneurysm. No abdominal adenopathy. No pelvic adenopathy. Reproductive: Prostate is small. Foley catheter in the urinary bladder. Other: Left lower quadrant colostomy. No free air. No ascites. Musculoskeletal: Signs of previous trauma to the right chest but with new right-sided rib fractures involving the right fourth, sixth, seventh and eighth ribs posteriorly. Acute fracture also of anterior right sixth  rib. Sternum is intact. Costochondral elements are intact. Marked glenohumeral degenerative changes on the right with post operative changes of left shoulder arthroplasty as before. Signs of prior rib fracture on the left as well with similar appearance to the previous study. Multilevel spinal degenerative change. IMPRESSION: 1. Increased pericardial effusion. Echocardiography may be helpful given moderate to marked increase in size since previous examination. 2. Signs of previous trauma to the right chest but with new right-sided rib fractures involving the right fourth, sixth, seventh and eighth ribs posteriorly. 3. Increasing left effusion with bronchial wall thickening in the left lung base. Findings could be related to aspiration pneumonitis and are associated with airspace disease on today's study. 4. Increasing nodularity in the right chest. Correlate with any ongoing radiotherapy. Pneumonitis or worsening of disease is also considered. Short interval follow-up may be helpful as findings are of uncertain significance in the setting of trauma with right-sided rib fractures. 5. Areas also in the right lower lobe that warrant attention on follow-up, potentially related to posttraumatic changes or inflammation though disease worsening is also considered. 6. Small left-sided effusion is enlarged. Electronically Signed: By: Zetta Bills M.D. On: 03/13/2020 17:20   CT Cervical Spine Wo Contrast  Result Date: 03/13/2020 CLINICAL DATA:  [place]. Facial trauma. EXAM: CT HEAD WITHOUT CONTRAST CT MAXILLOFACIAL WITHOUT CONTRAST CT CERVICAL SPINE WITHOUT CONTRAST TECHNIQUE: Multidetector  CT imaging of the head, cervical spine, and maxillofacial structures were performed using the standard protocol without intravenous contrast. Multiplanar CT image reconstructions of the cervical spine and maxillofacial structures were also generated. COMPARISON:  MRI brain 06/30/2019 and head CT from 2015. FINDINGS: CT HEAD FINDINGS  Brain: Stable age related cerebral atrophy, ventriculomegaly and periventricular white matter disease. Scattered basal ganglia calcifications. No extra-axial fluid collections are identified. No CT findings for acute hemispheric infarction or intracranial hemorrhage. No mass lesions. The brainstem and cerebellum are normal. Vascular: Vascular calcifications but no aneurysm or hyperdense vessels. Skull: No skull fracture or bone lesions. Other: No scalp lesions or hematoma. CT MAXILLOFACIAL FINDINGS Osseous: Exam limited by patient motion but no definite facial bone fractures are identified. The mandibular condyles are normally located. No obvious mandible fracture. Orbits: The bony orbits are intact. Of the globes are intact. Sinuses: Patchy ethmoid sinus disease and partial opacification of the right half of the sphenoid sinus. The right maxillary sinus contains a mucous retention cyst or polyp. Soft tissues: No significant soft tissue findings. CT CERVICAL SPINE FINDINGS Alignment: Normal overall alignment. There is severe degenerative cervical spondylosis with multilevel disc disease and facet disease and mild multilevel degenerative subluxations. Skull base and vertebrae: Exam limited by motion artifact but no definite acute fractures. Soft tissues and spinal canal: No prevertebral fluid or swelling. No visible canal hematoma. Disc levels: The spinal canal is patent. Mild multilevel spinal and foraminal stenosis but no large disc protrusions. Upper chest: Emphysematous changes are noted at the lung apices with dense scarring changes. Other: Carotid artery calcifications are noted. IMPRESSION: 1. Stable age related cerebral atrophy, ventriculomegaly and periventricular white matter disease. 2. No acute intracranial findings or skull fracture. 3. No definite acute facial bone fractures. 4. Severe degenerative cervical spondylosis with multilevel disc disease and facet disease but no definite acute cervical spine  fracture. Electronically Signed   By: Marijo Sanes M.D.   On: 03/13/2020 10:09   CT ABDOMEN PELVIS W CONTRAST  Addendum Date: 03/13/2020   ADDENDUM REPORT: 03/13/2020 19:50 ADDENDUM: Signs of interval left hip arthroplasty. Fluid about the arthroplasty may be postoperative given recent placement. Correlate with any clinical signs of infection. Electronically Signed   By: Zetta Bills M.D.   On: 03/13/2020 19:50   Result Date: 03/13/2020 CLINICAL DATA:  Small cell lung cancer assess treatment response. Also experience ground level fall, on Eliquis. EXAM: CT CHEST, ABDOMEN, AND PELVIS WITH CONTRAST TECHNIQUE: Multidetector CT imaging of the chest, abdomen and pelvis was performed following the standard protocol during bolus administration of intravenous contrast. CONTRAST:  145mL OMNIPAQUE IOHEXOL 300 MG/ML  SOLN COMPARISON:  01/23/2020 FINDINGS: CT CHEST FINDINGS Cardiovascular: Calcified and noncalcified atherosclerotic plaque throughout the aorta its branches in the chest. No aneurysmal dilation of thoracic aorta. Are stable in terms of mild dilation. Enlarging pericardial effusion, 2 cm thickness along the mid cardiac contour previously approximately 1 cm. Mediastinum/Nodes: Limited assessment of thoracic inlet structures due to motion artifact. No axillary lymphadenopathy. No hilar lymphadenopathy. No mediastinal lymphadenopathy. Esophagus grossly normal. Lungs/Pleura: Bronchial wall thickening at the left hilum with signs of lower lobe bronchiectasis, this is developed since the previous study. Small left-sided effusion is enlarged. Small right effusion with basilar volume loss. Multifocal areas of ground-glass nodularity within aerated portions of the chest and areas of part solid nodules, similar to the prior study. (Image 47, series 4) 2.8 x 1.8 cm, previously 2.9 x 1.6 cm. Solid component appears similar. Ground-glass with increased  soft tissue density with subpleural bandlike opacity that has  developed amidst ground-glass changes in the right upper lobe (image 57, series 4) 1.7 by 0.7 cm, no solid-appearing abnormality was present in this location on the previous exam. Pure ground-glass lesion in the left upper lobe shows no solid component on today's study measuring approximately 2.4 cm in greatest axial dimension appearing more conspicuous but unchanged in terms of size. Areas of volume loss and opacity in the right lower lobe show increasing density along the superior aspect in the superior segment of the right lower lobe (image 78, series 4) 2.5 x 1.6 cm, this area previously measured approximately 2 point 2 by 1.2 cm. Background pulmonary emphysema. Musculoskeletal: No chest wall mass. See below for full musculoskeletal details. CT ABDOMEN PELVIS FINDINGS Hepatobiliary: Small hyperdense focus in the dome of the right hemi liver approximately cm unchanged likely a small hemangioma. Low-density focus along the margin of the inferior right hemi liver anteriorly (image 63, series 7) 8 mm, unchanged as is another area of low-density in hepatic subsegment VI measuring 15 mm. Pancreas: Pancreas is normal without focal lesion or ductal dilation. Spleen: Spleen is normal size without focal lesion. Adrenals/Urinary Tract: Adrenal glands with mild thickening of the right adrenal gland unchanged. Left adrenal gland is normal. Renal enhancement is symmetric with some scarring along the lateral cortex of the right kidney. Small low-density foci in the left kidney are likely small cysts and unchanged. No hydronephrosis. Stomach/Bowel: No acute small bowel process. 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 Stool fills much of the colon. The appendix is normal. Vascular/Lymphatic: Calcified and noncalcified plaque of the abdominal aorta without aneurysm. No abdominal adenopathy. No pelvic adenopathy. Reproductive: Prostate is small. Foley catheter in the urinary bladder. Other: Left  lower quadrant colostomy. No free air. No ascites. Musculoskeletal: Signs of previous trauma to the right chest but with new right-sided rib fractures involving the right fourth, sixth, seventh and eighth ribs posteriorly. Acute fracture also of anterior right sixth rib. Sternum is intact. Costochondral elements are intact. Marked glenohumeral degenerative changes on the right with post operative changes of left shoulder arthroplasty as before. Signs of prior rib fracture on the left as well with similar appearance to the previous study. Multilevel spinal degenerative change. IMPRESSION: 1. Increased pericardial effusion. Echocardiography may be helpful given moderate to marked increase in size since previous examination. 2. Signs of previous trauma to the right chest but with new right-sided rib fractures involving the right fourth, sixth, seventh and eighth ribs posteriorly. 3. Increasing left effusion with bronchial wall thickening in the left lung base. Findings could be related to aspiration pneumonitis and are associated with airspace disease on today's study. 4. Increasing nodularity in the right chest. Correlate with any ongoing radiotherapy. Pneumonitis or worsening of disease is also considered. Short interval follow-up may be helpful as findings are of uncertain significance in the setting of trauma with right-sided rib fractures. 5. Areas also in the right lower lobe that warrant attention on follow-up, potentially related to posttraumatic changes or inflammation though disease worsening is also considered. 6. Small left-sided effusion is enlarged. Electronically Signed: By: Zetta Bills M.D. On: 03/13/2020 17:20   DG Hand 2 View Left  Result Date: 03/13/2020 CLINICAL DATA:  [place]. Thumb pain. EXAM: LEFT HAND - 2 VIEW COMPARISON:  None. 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 This is likely to be painful. Ordinary degenerative arthritis at the  MCP joints of the thumb, index and long fingers. Mild inter phalangeal joint osteoarthritis. IMPRESSION: 1. 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 2. Ordinary degenerative arthritis of the MCP joints of the thumb, index and long fingers. Electronically Signed   By: Nelson Chimes M.D.   On: 03/13/2020 11:42   DG HIP UNILAT WITH PELVIS 2-3 VIEWS LEFT  Result Date: 03/13/2020 CLINICAL DATA:  Pain following fall.  History of lung carcinoma EXAM: DG HIP (WITH OR WITHOUT PELVIS) 2-3V LEFT COMPARISON:  None. FINDINGS: Frontal pelvis as well as frontal and lateral left hip images were obtained. Bones are osteoporotic. 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 No acute fracture or dislocation. There is degenerative change in the lower lumbar spine. There is mild narrowing of the right hip joint. 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 No fracture or dislocation. 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 Electronically Signed   By: Lowella Grip III M.D.   On: 03/13/2020 10:35   ECHOCARDIOGRAM LIMITED  Result Date: 03/13/2020    ECHOCARDIOGRAM LIMITED REPORT   Patient Name:   ZACCHEUS EDMISTER Date of Exam: 03/13/2020 Medical Rec #:  [id number]     Height:       71.0 in Accession #:    [id number]    Weight:       135.0 lb Date of Birth:  1941/11/12    BSA:          1.784 m Patient Age:    1 years      BP:           162/68 mmHg Patient Gender: M             HR:           97 bpm. Exam Location:  Inpatient Procedure: Limited Echo, Limited Color Doppler and Cardiac Doppler STAT ECHO Indications:    pericardial effusion  History:        Patient has no prior history of Echocardiogram examinations.                 Lung cancer; Arrythmias:Atrial Fibrillation.  Sonographer:    Johny Chess Referring Phys:  [place]  1. Moderate to large pericardial effusion. The pericardial effusion is circumferential. Slightly larger compared to the 01/08/20 study. Effusion measures 1.6-1.8 cm adjacent to the RV in diastole depending on where measured. There is 10% respiratory variation across the MV, not significant. Partial RA diastolic indentation without full collapse. No RV collapse. IVC is not visualized. Overall findings not consistent with tamponade physiology.  2. Of note appears patient has two medical record numbers in regards to finding prior data and studies. FINDINGS  Pericardium: Effusion measures 1.6-1.8 cm adjacent to the RV in diastole depending on where measured. There is 10% respiratory variation across the MV, not significant. Partial RA diastolic collapse. No RV collapse. IVC is not visualized. Moderate to large pericardial effusion. The pericardial effusion is circumferential. Carlyle Dolly MD Electronically signed by Carlyle Dolly MD Signature Date/Time: 03/13/2020/8:58:41 PM    Final    CT Maxillofacial WO CM  Result Date: 03/13/2020 CLINICAL DATA:  [place]. Facial trauma. EXAM: CT HEAD WITHOUT CONTRAST CT MAXILLOFACIAL WITHOUT CONTRAST CT CERVICAL SPINE WITHOUT CONTRAST TECHNIQUE: Multidetector CT imaging of the head, cervical spine, and maxillofacial structures were performed using the standard protocol without intravenous  contrast. Multiplanar CT image reconstructions of the cervical spine and maxillofacial structures were also generated. COMPARISON:  MRI brain 06/30/2019 and head CT from 2015. FINDINGS: CT HEAD FINDINGS Brain: Stable age related cerebral atrophy, ventriculomegaly and periventricular white matter disease. Scattered basal ganglia calcifications. No extra-axial fluid collections are identified. No CT findings for acute hemispheric infarction or intracranial hemorrhage. No mass lesions. The brainstem and cerebellum are normal. Vascular: Vascular calcifications but no  aneurysm or hyperdense vessels. Skull: No skull fracture or bone lesions. Other: No scalp lesions or hematoma. CT MAXILLOFACIAL FINDINGS Osseous: Exam limited by patient motion but no definite facial bone fractures are identified. The mandibular condyles are normally located. No obvious mandible fracture. Orbits: The bony orbits are intact. Of the globes are intact. Sinuses: Patchy ethmoid sinus disease and partial opacification of the right half of the sphenoid sinus. The right maxillary sinus contains a mucous retention cyst or polyp. Soft tissues: No significant soft tissue findings. CT CERVICAL SPINE FINDINGS Alignment: Normal overall alignment. There is severe degenerative cervical spondylosis with multilevel disc disease and facet disease and mild multilevel degenerative subluxations. Skull base and vertebrae: Exam limited by motion artifact but no definite acute fractures. Soft tissues and spinal canal: No prevertebral fluid or swelling. No visible canal hematoma. Disc levels: The spinal canal is patent. Mild multilevel spinal and foraminal stenosis but no large disc protrusions. Upper chest: Emphysematous changes are noted at the lung apices with dense scarring changes. Other: Carotid artery calcifications are noted. IMPRESSION: 1. Stable age related cerebral atrophy, ventriculomegaly and periventricular white matter disease. 2. No acute intracranial findings or skull fracture. 3. No definite acute facial bone fractures. 4. Severe degenerative cervical spondylosis with multilevel disc disease and facet disease but no definite acute cervical spine fracture. Electronically Signed   By: Marijo Sanes M.D.   On: 03/13/2020 10:09   DG Knee 3 Views Left  Result Date: 03/14/2020 CLINICAL DATA:  Left knee swelling, recent fall EXAM: LEFT KNEE - 3 VIEW COMPARISON:  None. FINDINGS: No evidence of acute fracture. No dislocation. Moderate to severe tricompartmental osteoarthritis, most pronounced within the lateral  compartment. Chondrocalcinosis noted. Small knee joint effusion. Mild suprapatellar soft tissue swelling. IMPRESSION: 1. No acute osseous abnormality. 2. Moderate to severe tricompartmental osteoarthritis. 3. Small knee joint effusion. Mild suprapatellar soft tissue swelling. Electronically Signed   By: Davina Poke D.O.   On: 03/14/2020 15:37     Labs:   Basic Metabolic Panel: Recent Labs  Lab 03/19/20 0455 03/19/20 0455 03/20/20 0308 03/20/20 0308 03/21/20 0510 03/21/20 0510 03/22/20 0459 03/22/20 0459 03/23/20 0532 03/23/20 0532 03/24/20 0302 03/25/20 0314  NA 130*   < > 129*   < > 130*  --  129*  --  130*  --  129* 131*  K 4.7   < > 4.0   < > 3.8   < > 3.8   < > 4.2   < > 4.1 3.7  CL 95*   < > 95*   < > 97*  --  96*  --  95*  --  95* 95*  CO2 27   < > 25   < > 29  --  30  --  26  --  24 27  GLUCOSE 123*   < > 88   < > 81  --  82  --  98  --  102* 101*  BUN 14   < > 13   < > 15  --  15  --  20  --  21 19  CREATININE 0.53*   < > 0.56*   < > 0.59*  --  0.58*  --  0.63  --  0.61 0.55*  CALCIUM 8.7*   < > 8.6*   < > 8.4*  --  8.5*  --  8.7*  --  8.5* 8.8*  MG 1.7  --  1.6*  --   --   --   --   --   --   --   --   --    < > = values in this interval not displayed.   GFR Estimated Creatinine Clearance: 63.3 mL/min (A) (by C-G formula based on SCr of 0.55 mg/dL (L)). Liver Function Tests: No results for input(s): AST, ALT, ALKPHOS, BILITOT, PROT, ALBUMIN in the last 168 hours. No results for input(s): LIPASE, AMYLASE in the last 168 hours. No results for input(s): AMMONIA in the last 168 hours. Coagulation profile No results for input(s): INR, PROTIME in the last 168 hours.  CBC: Recent Labs  Lab 03/19/20 0455 03/20/20 0308  WBC 7.4 8.1  HGB 10.8* 11.2*  HCT 32.0* 34.0*  MCV 86.0 85.9  PLT 412* 419*   Cardiac Enzymes: No results for input(s): CKTOTAL, CKMB, CKMBINDEX, TROPONINI in the last 168 hours. BNP: Invalid input(s): POCBNP CBG: No results for input(s):  GLUCAP in the last 168 hours. D-Dimer No results for input(s): DDIMER in the last 72 hours. Hgb A1c No results for input(s): HGBA1C in the last 72 hours. Lipid Profile No results for input(s): CHOL, HDL, LDLCALC, TRIG, CHOLHDL, LDLDIRECT in the last 72 hours. Thyroid function studies No results for input(s): TSH, T4TOTAL, T3FREE, THYROIDAB in the last 72 hours.  Invalid input(s): FREET3 Anemia work up No results for input(s): VITAMINB12, FOLATE, FERRITIN, TIBC, IRON, RETICCTPCT in the last 72 hours. Microbiology Recent Results (from the past 240 hour(s))  Culture, Urine     Status: Abnormal   Collection Time: 03/19/20 10:43 AM   Specimen: Urine, Random  Result Value Ref Range Status   Specimen Description URINE, RANDOM  Final   Special Requests   Final    NONE Performed at [facility], [address] [place], [place] [address]    Culture >=100,000 COLONIES/mL YEAST (A)  Final   Report Status 03/20/2020 FINAL  Final    Please note: You were cared for by a hospitalist during your hospital stay. Once you are discharged, your primary care physician will handle any further medical issues. Please note that NO REFILLS for any discharge medications will be authorized once you are discharged, as it is imperative that you return to your primary care physician (or establish a relationship with a primary care physician if you do not have one) for your post hospital discharge needs so that they can reassess your need for medications and monitor your lab values.  Signed: Marlowe Aschoff Jvon Meroney  [facility] 03/25/2020, 11:39 AM

## 2020-03-25 NOTE — Progress Notes (Addendum)
Inpatient Rehabilitation Admissions Coordinator  CIR bed is available to admit patient to today. I met with patient with his wife, [name] and son, [name] at bedside. They are in agreement to admit. I have notified Dr. Pietro Cassis, acute care team, SW, [place]. I will make the arrangements to admit today. I clairfied visitation of 2 people only being wife, [name] and one child. Not both son, [name] and daughter, [name]. Wife to confirm who those two visitors will be with me prior to admit to CIR this afternoon.   Danne Baxter, RN, MSN Rehab Admissions Coordinator [phone] 03/25/2020 10:16 AM

## 2020-03-25 NOTE — Progress Notes (Signed)
Inpatient Rehabilitation Medication Review by a Pharmacist  A complete drug regimen review was completed for this patient to identify any potential clinically significant medication issues.  Clinically significant medication issues were identified:  No  Pharmacist comments: Drug interaction noted between colchicine and fluconazole, however patient will receive last dose of fluconazole on 4/22, therefore will keep current colchicine dose.   Time spent performing this drug regimen review (minutes):  [place], PharmD PGY1 Pharmacy Resident  Please check AMION for all [place] phone numbers After 10:00 PM, call [place] [phone]  03/25/2020 5:46 PM

## 2020-03-25 NOTE — Progress Notes (Signed)
Anthony Arn, MD  Physician  Physical Medicine and Rehabilitation  PMR Pre-admission      Signed  Date of Service:  03/25/2020 10:52 AM      Related encounter: ED to Hosp-Admission (Discharged) from 03/13/2020 in [facility]      Signed        Show:Clear all _0 Manual_1 Template_2 Copied  Added by: _3 Anthony Gong, RN_4 Anthony Arn, MD  _5 Hover for details PMR Admission Coordinator Pre-Admission Assessment   Patient: Anthony Curry is an 79 y.o., male MRN: [id number] DOB: February 13, 1941 Height: _6  (180.3 cm) Weight: 60.8 kg   Insurance Information HMO:     PPO:      PCP:      IPA:      80/20:      OTHER:  PRIMARY: Medicare a and b      Policy#: [id number]      Subscriber: pt Benefits:  Phone #: passport one online     Name: 4/21 Eff. Date: a 09/04/2004 and b 02/02/2005     Deduct: $1484      Out of Pocket Max: none      Life Max: none CIR: 100%      SNF: has been to SNF twice since 12/2019 so days limited Outpatient: 80%     Co-Pay: 20% Home Health: 100%      Co-Pay: NONE DME: 80%     Co-Pay: 20% Providers: Pt choice  SECONDARY: BCBS supplement      Policy#: [id number]        Financial Counselor:       Phone#:    The "Data Collection Information Summary" for patients in Inpatient Rehabilitation Facilities with attached "Privacy Act [place] Records" was provided and verbally reviewed with: Patient and family   Emergency Contact Information         Contact Information     Name Relation Home [place], Anthony Curry Daughter [phone]        Anthony Curry, [place] Spouse     [phone]         Current Medical History  Patient Admitting Diagnosis: Debility   History of Present Illness: [name] is an 79 year old male with history of stage IV lung cancer s/p XRT, pericardial effusion, A fib-on Eliquis, chronic hyponatremia, Covid 19 PNA dx 12/2019 as well as multiple recent admissions for perforated diverticulitis  requiring sigmoid colectomy with colostomy complicated by urinary retention, left hand ischemia requiring left brachial embolectomy and readmission 02/17/20 with fall with hip fracture 02/17/20 s/p left hip hemi and was discharged to SNF for therapy but patient elected to go home next day. He ws admitted on 03/13/20 with fall onto his head with LOC and reports of dizziness, cough and weakness. He was found to have worsening of hyponatremia with Na-124 and was started on gentle hydration and demeclocyline added--Remeron d/c to prevent further drop.  CT chest showed increase in pericardial effusion with new right 4,6,7 and 8th rib fractures, increase in left lung base effusion with air space disease question due to aspiration pneumonitis as well as RLL changes question post traumatic v/s disease worsening.    Repeat 2D echo done revealing moderate to large pericardial effusion--slightly larger c/w 01/08/20 and no tamponade physiology. Dr. 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 Hem/Onc consulted for input 4/13 and felt that X rays  without definite disease progression --->patient started on Augmentin X 7 days due to concerns of aspiration/immune mediated pneumonitis as well as steroids with recommendations for slow taper after 1-2 weeks. Hospital course significant for gout flare left knee with effusion--refused tap, candida UTI with dysuria, urinary retention--foley changed out on 04/16, hypoxia with activity as well as back/hip pain. He continues to have anxiety as well as tachycardia with HR up to 120's activity, shuffling gait with poor safety awareness, needs verbal and tactile cues to follow commands and requires multiple rest breaks with cues for pursed breathing due to recover from significant dyspnea with activity.   Patient's medical record from [facility] has  been reviewed by the rehabilitation admission coordinator and physician.   Past Medical History      Past Medical History:  Diagnosis Date  . Atrial fibrillation, chronic ([place])    . Fall    . Hyponatremia    . Pericardial effusion    . Stage 4 lung cancer, unspecified laterality ([place])        Family History   family history is not on file.   Prior Rehab/Hospitalizations Has the patient had prior rehab or hospitalizations prior to admission? Yes SNF at [facility] and [facility] in past few months. Patient had very traumatic experiences at both   Has the patient had major surgery during 100 days prior to admission? Yes              Current Medications   Current Facility-Administered Medications:  .  acetaminophen (TYLENOL) tablet 650 mg, 650 mg, Oral, Q6H PRN **OR** acetaminophen (TYLENOL) suppository 650 mg, 650 mg, Rectal, Q6H PRN, Karmen Bongo, MD .  ALPRAZolam Duanne Moron) tablet 0.25 mg, 0.25 mg, Oral, QHS PRN, Karmen Bongo, MD, 0.25 mg at 03/23/20 2104 .  apixaban (ELIQUIS) tablet 5 mg, 5 mg, Oral, BID, Caren Griffins, MD, 5 mg at 03/25/20 0841 .  bisacodyl (DULCOLAX) EC tablet 5 mg, 5 mg, Oral, Daily PRN, Karmen Bongo, MD .  Chlorhexidine Gluconate Cloth 2 % PADS 6 each, 6 each, Topical, Daily, Osei-Bonsu, Iona Beard, MD, 6 each at 03/25/20 [id number] .  colchicine tablet 0.6 mg, 0.6 mg, Oral, BID, Charlynne Cousins, MD, 0.6 mg at 03/25/20 0841 .  demeclocycline (DECLOMYCIN) tablet 300 mg, 300 mg, Oral, BID, Osei-Bonsu, George, MD, 300 mg at 03/25/20 0840 .  dextromethorphan-guaiFENesin (MUCINEX DM) 30-600 MG per 12 hr tablet 1 tablet, 1 tablet, Oral, BID, Osei-Bonsu, George, MD, 1 tablet at 03/25/20 0840 .  diltiazem (CARDIZEM CD) 24 hr capsule 180 mg, 180 mg, Oral, Daily, Karmen Bongo, MD, 180 mg at 03/25/20 0840 .  docusate sodium (COLACE) capsule 100 mg, 100 mg, Oral, BID, Karmen Bongo, MD, 100 mg at 03/25/20 0841 .  fluconazole (DIFLUCAN) tablet 100 mg,  100 mg, Oral, Daily, Caren Griffins, MD, 100 mg at 03/25/20 0847 .  guaiFENesin-dextromethorphan (ROBITUSSIN DM) 100-10 MG/5ML syrup 5 mL, 5 mL, Oral, Q4H PRN, Caren Griffins, MD, 5 mL at 03/23/20 1939 .  hydrALAZINE (APRESOLINE) injection 5 mg, 5 mg, Intravenous, Q4H PRN, Karmen Bongo, MD, 5 mg at 03/25/20 1093 .  HYDROcodone-acetaminophen (NORCO/VICODIN) 5-325 MG per tablet 1-2 tablet, 1-2 tablet, Oral, Q4H PRN, Karmen Bongo, MD, 2 tablet at 03/17/20 2056 .  ipratropium-albuterol (DUONEB) 0.5-2.5 (3) MG/3ML nebulizer solution 3 mL, 3 mL, Nebulization, Q6H PRN, Blount, Xenia T, NP, 3 mL at 03/15/20 1426 .  levothyroxine (SYNTHROID) tablet 50 mcg, 50 mcg, Oral, Q0600, Karmen Bongo, MD, 50  mcg at 03/25/20 0621 .  lidocaine (LIDODERM) 5 % 1 patch, 1 patch, Transdermal, Q24H, Karmen Bongo, MD, 1 patch at 03/24/20 2216 .  menthol-cetylpyridinium (CEPACOL) lozenge 3 mg, 1 lozenge, Oral, PRN, Opyd, Ilene Qua, MD, 3 mg at 03/23/20 1940 .  morphine 2 MG/ML injection 2 mg, 2 mg, Intravenous, Q2H PRN, Karmen Bongo, MD .  ondansetron [facility]) tablet 4 mg, 4 mg, Oral, Q6H PRN **OR** ondansetron (ZOFRAN) injection 4 mg, 4 mg, Intravenous, Q6H PRN, Karmen Bongo, MD .  polyethylene glycol (MIRALAX / GLYCOLAX) packet 17 g, 17 g, Oral, Daily PRN, Karmen Bongo, MD .  predniSONE (DELTASONE) tablet 60 mg, 60 mg, Oral, Q breakfast, Curcio, Kristin R, NP, 60 mg at 03/25/20 0840 .  sodium chloride (OCEAN) 0.65 % nasal spray 1 spray, 1 spray, Each Nare, PRN, Dahal, Binaya, MD .  sodium chloride flush (NS) 0.9 % injection 3 mL, 3 mL, Intravenous, Q12H, Karmen Bongo, MD, 3 mL at 03/25/20 0842 .  tamsulosin (FLOMAX) capsule 0.4 mg, 0.4 mg, Oral, BID, Karmen Bongo, MD, 0.4 mg at 03/25/20 0841 .  zolpidem (AMBIEN) tablet 5 mg, 5 mg, Oral, QHS PRN, Karmen Bongo, MD, 5 mg at 03/24/20 2233   Patients Current Diet:     Diet Order                      Diet regular Room service appropriate?  Yes; Fluid consistency: Thin; Fluid restriction: 1500 mL Fluid  Diet effective now                   Precautions / Restrictions Precautions Precautions: Fall Precaution Comments: colostomy; foley Restrictions Weight Bearing Restrictions: No Other Position/Activity Restrictions: none listed or endorsed following recent hip surgery - confirmed with Josh, RN    Has the patient had 2 or more falls or a fall with injury in the past year? Yes   Prior Activity Level Limited Community (1-2x/wk): decline in function since 12/2019 when he had COVID 19   Prior Functional Level Self Care: Did the patient need help bathing, dressing, using the toilet or eating? Independent   Indoor Mobility: Did the patient need assistance with walking from room to room (with or without device)? Independent   Stairs: Did the patient need assistance with internal or external stairs (with or without device)? Independent   Functional Cognition: Did the patient need help planning regular tasks such as shopping or remembering to take medications? Needed some help   Home Assistive Devices / [place] Devices/Equipment: None Home Equipment: Walker - 2 wheels, Shower seat   Prior Device Use: Indicate devices/aids used by the patient prior to current illness, exacerbation or injury? Walker   Current Functional Level Cognition   Overall Cognitive Status: (NT formally) Current Attention Level: Selective Orientation Level: Oriented X4 Following Commands: Follows one step commands consistently Safety/Judgement: Decreased awareness of safety, Decreased awareness of deficits General Comments: anxious with mobility and with depressed affect about current situation, eager to d/c to CIR    Extremity Assessment (includes Sensation/Coordination)   Upper Extremity Assessment: Defer to OT evaluation  Lower Extremity Assessment: Generalized weakness, LLE deficits/detail LLE Deficits / Details: swelling and  pain in L knee, decr WB tolerance, decr extension through joint     ADLs   Overall ADL's : Needs assistance/impaired Eating/Feeding: Modified independent, Sitting Eating/Feeding Details (indicate cue type and reason): pt eating breakfast end of session while seated in recliner  Grooming: Min guard, Standing, Oral care,  Brushing hair, Sitting Grooming Details (indicate cue type and reason): While standing at the sink with pt requiring 2 seated rest breaks.  Upper Body Bathing: Min guard, Sitting Lower Body Bathing: Moderate assistance, Sit to/from stand Upper Body Dressing : Set up, Sitting Lower Body Dressing: Set up, Sitting/lateral leans Lower Body Dressing Details (indicate cue type and reason): To don/doff socks while seated EOB. Pt incorporated use of figure four position. Required mod encouragement to complete as independently as possible.  Toilet Transfer: Minimal assistance, +2 for physical assistance, Stand-pivot, RW Toilet Transfer Details (indicate cue type and reason): simulated via transfer to [place] and Hygiene: Minimal assistance, Sit to/from stand Functional mobility during ADLs: Min guard, Rolling walker General ADL Comments: educated in pursed lip breathing techniques, monitoring of symptoms as he tends to rush through activities as he fatigues     Mobility   Overal bed mobility: Needs Assistance Bed Mobility: Supine to Sit, Sit to Supine Supine to sit: Min guard Sit to supine: Min guard General bed mobility comments: min guard for supine to sit for safety with use of bedrails and increased time, min assist for sit to supine for LE lifting into bed, positioning.     Transfers   Overall transfer level: Needs assistance Equipment used: 4-wheeled walker Transfers: Sit to/from Stand Sit to Stand: Min guard General transfer comment: cues for hand placement, and cues for brake safety with with the Rollator     Ambulation / Gait / Stairs  / Wheelchair Mobility   Ambulation/Gait Ambulation/Gait assistance: Counsellor (Feet): 80 Feet(then 75 feet and then 70 feet with rollator) Assistive device: 4-wheeled walker Gait Pattern/deviations: Step-through pattern General Gait Details: cues for posture, proximity to rollotor, pt wit mild waggle of rollator, improved stability with distance.  SpO2 on RA with gait 93-93% with HR in th 110's.  On 2L [place], SpO2 between 95-97 with HR rising to the 100's Gait velocity: decr Gait velocity interpretation: <1.8 ft/sec, indicate of risk for recurrent falls     Posture / Balance Balance Overall balance assessment: Needs assistance, History of Falls Sitting-balance support: Feet supported, No upper extremity supported Sitting balance-Leahy Scale: Good Standing balance support: Bilateral upper extremity supported, During functional activity Standing balance-Leahy Scale: Poor Standing balance comment: reliant on external support.     Special needs/care consideration Oxygen 2 liters [place], Skin MASD to buttocks and sacrum; ecchymosis to arms bilaterally, Behavioral consideration patietn needs encouragement and feeling of safety and to be heard, Special service needs Colostomy and Designated visitor [name] and TBD if son [name] or daughter, Anthony Curry to be the second visitor. I have emphasized that both children could not swithch off when they are in town as they have done on acute    Previous Home Environment (from acute therapy documentation) Living Arrangements: Spouse/significant other  Lives With: Spouse Available Help at Discharge: Family, Available 24 hours/day(wife; two children are from out of state and visit) Type of Home: Other(Comment)(condo) Home Layout: One level Home Access: Building control surveyor Shower/Tub: Multimedia programmer: Standard Bathroom Accessibility: Yes How Accessible: Accessible via [place]: Other (Comment)(ostomy supplies) Additional  Comments: will need to clarify home setup and DME   Discharge Living Setting Plans for Discharge Living Setting: Patient's home, Other (Comment)(condo) Type of Home at Discharge: (condo) Discharge Home Layout: One level Discharge Home Access: Elevator Discharge Bathroom Shower/Tub: Walk-in shower Discharge Bathroom Toilet: Standard Discharge Bathroom Accessibility: Yes How Accessible: Accessible via walker Does the patient  have any problems obtaining your medications?: No   Social/Family/Support Systems Patient Roles: Spouse, Parent Contact Information: wife, [name] and daughter, Anthony Curry Anticipated Caregiver: wife Anticipated Caregiver's Contact Information: see above Ability/Limitations of Caregiver: wife can provide supervision level; wife manages his colostomy Caregiver Availability: 24/7 Discharge Plan Discussed with Primary Caregiver: Yes Is Caregiver In Agreement with Plan?: Yes Does Caregiver/Family have Issues with Lodging/Transportation while Pt is in Rehab?: No   Goals Patient/Family Goal for Rehab: Mod I to supervision with PT and OT Expected length of stay: ELOS 7 to 10 days Pt/Family Agrees to Admission and willing to participate: Yes Program Orientation Provided & Reviewed with Pt/Caregiver Including Roles  & Responsibilities: Yes   Decrease burden of Care through IP rehab admission: n/a   Possible need for SNF placement upon discharge: Has been to [facility] and [facility] since 01/2020 with emotionally traumatic experiences   Patient Condition: I have reviewed medical records from [facility] , spoken with CSW, and patient, spouse and son. I met with patient at the bedside for inpatient rehabilitation assessment.  Patient will benefit from ongoing PT and OT, can actively participate in 3 hours of therapy a day 5 days of the week, and can make measurable gains during the admission.  Patient will also benefit from the coordinated team approach  during an Inpatient Acute Rehabilitation admission.  The patient will receive intensive therapy as well as Rehabilitation physician, nursing, social worker, and care management interventions.  Due to bladder management, bowel management, safety, skin/wound care, disease management, medication administration, pain management and patient education the patient requires 24 hour a day rehabilitation nursing.  The patient is currently min assist overall with mobility and basic ADLs.  Discharge setting and therapy post discharge at home with home health is anticipated.  Patient has agreed to participate in the Acute Inpatient Rehabilitation Program and will admit today.   Preadmission Screen Completed By:  Cleatrice Burke, 03/25/2020 10:55 AM ______________________________________________________________________   Discussed status with Dr. Posey Pronto  on 03/25/2020 at 23 and received approval for admission today.   Admission Coordinator:  Cleatrice Burke, RN, time 1106 Date  03/25/2020    Assessment/Plan: Diagnosis: Debility 1. Does the need for close, 24 hr/day Medical supervision in concert with the patient's rehab needs make it unreasonable for this patient to be served in a less intensive setting? Yes and Potentially 2. Co-Morbidities requiring supervision/potential complications: stage IV lung cancer s/p XRT, pericardial effusion, A fib, chronic hyponatremia, Covid 19 PNA dx 12/2019, perforated diverticulitis requiring sigmoid colectomy with colostomy complicated by urinary retention, left hand ischemia requiring left brachial embolectomy, recent hip fracture 02/17/20 s/p left hip hemi 3. Due to safety, disease management and patient education, does the patient require 24 hr/day rehab nursing? Potentially 4. Does the patient require coordinated care of a physician, rehab nurse, PT, OT to address physical and functional deficits in the context of the above medical diagnosis(es)?  Potentially Addressing deficits in the following areas: balance, endurance, locomotion, strength, transferring, bathing, dressing, toileting, psychosocial support and Endurance 5. Can the patient actively participate in an intensive therapy program of at least 3 hrs of therapy 5 days a week? Yes 6. The potential for patient to make measurable gains while on inpatient rehab is excellent and good 7. Anticipated functional outcomes upon discharge from inpatient rehab: modified independent and supervision PT, modified independent and supervision OT, n/a SLP 8. Estimated rehab length of stay to reach the above functional goals is:  5-7 days. 9. Anticipated discharge destination: Home 10. Overall Rehab/Functional Prognosis: good     MD Signature: Delice Lesch, MD, ABPMR        Revision History

## 2020-03-25 NOTE — Plan of Care (Signed)

## 2020-03-25 NOTE — Plan of Care (Signed)
  Problem: RH SKIN INTEGRITY Goal: RH STG SKIN FREE OF INFECTION/BREAKDOWN Description: Min assist  Outcome: Progressing Goal: RH STG MAINTAIN SKIN INTEGRITY WITH ASSISTANCE Description: STG Maintain Skin Integrity With min assist  Outcome: Progressing   Problem: RH SAFETY Goal: RH STG ADHERE TO SAFETY PRECAUTIONS W/ASSISTANCE/DEVICE Description: STG Adhere to Safety Precautions independently   Outcome: Progressing   Problem: RH KNOWLEDGE DEFICIT GENERAL Goal: RH STG INCREASE KNOWLEDGE OF SELF CARE AFTER HOSPITALIZATION Outcome: Progressing

## 2020-03-25 NOTE — H&P (Addendum)
Physical Medicine and Rehabilitation Admission H&P    Chief Complaint  Patient presents with  . Debility    HPI: Anthony Curry is an 79 year old male with history of stage IV lung cancer s/p XRT, pericardial effusion, A fib-on Eliquis, chronic hyponatremia, multiple recent admissions for perforated diverticulitis requiring sigmoid colectomy with colostomy, found to have Covid 19 PNA and hospital course complicated by urinary retention, left hand ischemia requiring left brachial embolectomy. History taken from chart review, wife, and patient, due to patient's agitation with current situation. He was readmitted on 02/17/20 with left femoral neck fracture s/p left hip hemi and was discharged to SNF for therapy but patient elected to go home next day. He ws admitted again on 03/13/20 after a fall, hitting his head with LOC and subsequent dizziness, cough, and weakness. He was found to have worsening of hyponatremia with Na-124 and was started on gentle IV hydration. Demeclocyline added--Remeron d/ced to prevent further drop.  CT chest showed increased pericardial effusion with new right 4,6,7 and 8th rib fractures, increase in left lung base effusion with air space disease question due to aspiration pneumonitis as well as RLL changes question post traumatic v/s disease worsening.   Repeat 2D echo showing moderate/large pleural effusion --slightly larger compared to 01/08/20 and no tamponade physiology. Dr. Margaretann Loveless consulted for input and felt that persistent effusion secondary to metastatic lung CA and no significant benefit with intervention and potential risks as no tamponade noted. Hem/Onc consulted for input 03/17/20 and felt that X rays without definite disease progression --->patient started on Augmentin X 7 days due to concerns of aspiration/immune mediated pneumonitis as well as steroids with recommendations for slow taper after 1-2 weeks. Hospital course further complicated by gout flare left knee  with effusion--refused tap, candida UTI with dysuria, urinary retention--foley changed out on 04/16, hypoxia with activity as well as back/hip pain. He continues to have anxiety as well as tachycardia with HR up to 120's activity, shuffling gait with poor safety awareness, needs verbal and tactile cues to follow commands and requires multiple rest breaks with cues for pursed breathing due to recover from significant dyspnea with activity. CIR recommended due to functional decline. Please see preadmission from earlier today as well.   Review of Systems  Constitutional: Positive for malaise/fatigue.  HENT: Negative for hearing loss and tinnitus.   Eyes: Negative for blurred vision and double vision.  Respiratory: Positive for cough and shortness of breath.        Voice hoarse since admission.  Cardiovascular: Negative for chest pain and palpitations.  Gastrointestinal: Negative for abdominal pain and heartburn.  Genitourinary:       Foley in place  Musculoskeletal: Positive for back pain (due to rib fractures) and joint pain.  Neurological: Positive for weakness. Negative for dizziness and headaches.  Psychiatric/Behavioral: The patient is nervous/anxious.    Past Medical History:  Diagnosis Date  . Ankle fracture    right  . Atrial fibrillation, chronic ([place])   . COVID-19 12/2019  . Depression   . Epididymoorchitis 01/30/2020   with right hydrocele  . Hyponatremia   . Multiple falls   . Perforated diverticulum   . Pericardial effusion   . Stage 4 lung cancer, unspecified laterality [facility])     Past Surgical History:  Procedure Laterality Date  . COLON SURGERY     sigmoid colectomy with colostomy for ruptured diverticulum  . hemiarthroplasty Left 02/2020     Family History  Problem Relation Age  of Onset  . Lung cancer Mother   . Heart attack Father   . Brain cancer Brother      Social History:  Married. Independent prior to Jan 2021. He reports that he has quit smoking. He  has a 30.00 pack-year smoking history. He has never used smokeless tobacco. He reports previous alcohol use. He reports that he does not use drugs.   Allergies  Allergen Reactions  . Aspirin Other (See Comments)    Cannot consume as a result of him taking Eliquis (Blood thinner)    Medications Prior to Admission  Medication Sig Dispense Refill  . ALPRAZolam (XANAX) 0.25 MG tablet Take 0.25 mg by mouth at bedtime as needed for anxiety.    [name] apixaban (ELIQUIS) 5 MG TABS tablet Take 5 mg by mouth 2 (two) times daily.    [name] demeclocycline (DECLOMYCIN) 150 MG tablet Take 150 mg by mouth 2 (two) times daily.    [name] diltiazem (DILACOR XR) 180 MG 24 hr capsule Take 180 mg by mouth daily.    [name] HYDROcodone-acetaminophen (NORCO/VICODIN) 5-325 MG tablet Take 1 tablet by mouth 3 (three) times daily as needed for moderate pain.    [name] levothyroxine (SYNTHROID) 50 MCG tablet Take 50 mcg by mouth daily before breakfast.    . mirtazapine (REMERON) 30 MG tablet Take 30 mg by mouth at bedtime.    . Nivolumab (OPDIVO IV) Inject into the vein See admin instructions. Inject as directed every month.    . potassium chloride SA (KLOR-CON) 20 MEQ tablet Take 40 mEq by mouth 2 (two) times daily.     . tamsulosin (FLOMAX) 0.4 MG CAPS capsule Take 0.4 mg by mouth in the morning and at bedtime.      Drug Regimen Review  Drug regimen was reviewed and remains appropriate with no significant issues identified  Home: Home Living Family/patient expects to be discharged to:: Private residence Living Arrangements: Spouse/significant other Available Help at Discharge: Family, Available 24 hours/day(wife; two children are from out of state and visit) Type of Home: Other(Comment)(condo) Home Access: Elevator Home Layout: One level Bathroom Shower/Tub: Multimedia programmer: Standard Bathroom Accessibility: Yes Home Equipment: Environmental consultant - 2 wheels, Shower seat Additional Comments: will need to clarify home setup and  DME  Lives With: Spouse   Functional History: Prior Function Level of Independence: Independent with assistive device(s) Comments: reports typically ambulatory with RW, and able to perform ADL without assist  Functional Status:  Mobility: Bed Mobility Overal bed mobility: Needs Assistance Bed Mobility: Supine to Sit, Sit to Supine Supine to sit: Min guard Sit to supine: Min guard General bed mobility comments: min guard for supine to sit for safety with use of bedrails and increased time, min assist for sit to supine for LE lifting into bed, positioning. Transfers Overall transfer level: Needs assistance Equipment used: 4-wheeled walker Transfers: Sit to/from Stand Sit to Stand: Min guard General transfer comment: cues for hand placement, and cues for brake safety with with the Rollator Ambulation/Gait Ambulation/Gait assistance: Min guard Gait Distance (Feet): 80 Feet(then 75 feet and then 70 feet with rollator) Assistive device: 4-wheeled walker Gait Pattern/deviations: Step-through pattern General Gait Details: cues for posture, proximity to rollotor, pt wit mild waggle of rollator, improved stability with distance.  SpO2 on RA with gait 93-93% with HR in th 110's.  On 2L [place], SpO2 between 95-97 with HR rising to the 100's Gait velocity: decr Gait velocity interpretation: <1.8 ft/sec, indicate of risk for recurrent falls  ADL: ADL Overall ADL's : Needs assistance/impaired Eating/Feeding: Modified independent, Sitting Eating/Feeding Details (indicate cue type and reason): pt eating breakfast end of session while seated in recliner  Grooming: Min guard, Standing, Oral care, Brushing hair, Sitting Grooming Details (indicate cue type and reason): While standing at the sink with pt requiring 2 seated rest breaks.  Upper Body Bathing: Min guard, Sitting Lower Body Bathing: Moderate assistance, Sit to/from stand Upper Body Dressing : Set up, Sitting Lower Body Dressing: Set up,  Sitting/lateral leans Lower Body Dressing Details (indicate cue type and reason): To don/doff socks while seated EOB. Pt incorporated use of figure four position. Required mod encouragement to complete as independently as possible.  Toilet Transfer: Minimal assistance, +2 for physical assistance, Stand-pivot, RW Toilet Transfer Details (indicate cue type and reason): simulated via transfer to recliner Toileting- Clothing Manipulation and Hygiene: Minimal assistance, Sit to/from stand Functional mobility during ADLs: Min guard, Rolling walker General ADL Comments: educated in pursed lip breathing techniques, monitoring of symptoms as he tends to rush through activities as he fatigues  Cognition: Cognition Overall Cognitive Status: (NT formally) Orientation Level: Oriented X4 Cognition Arousal/Alertness: Awake/alert Behavior During Therapy: WFL for tasks assessed/performed, Flat affect Overall Cognitive Status: (NT formally) Area of Impairment: Safety/judgement, Problem solving, Memory, Following commands Orientation Level: Situation Current Attention Level: Selective Memory: Decreased short-term memory Following Commands: Follows one step commands consistently Safety/Judgement: Decreased awareness of safety, Decreased awareness of deficits Problem Solving: Decreased initiation, Requires verbal cues, Requires tactile cues General Comments: anxious with mobility and with depressed affect about current situation, eager to d/c to CIR   Blood pressure 140/68, pulse 73, temperature 97.8 F (36.6 C), temperature source Oral, resp. rate 18, height 5\' 11"  (1.803 m), weight 60.8 kg, SpO2 93 %. Physical Exam  Nursing note and vitals reviewed. Constitutional: He appears well-developed. He appears cachectic.  Frail  Eyes: EOM are normal. Right eye exhibits no discharge. Left eye exhibits no discharge.  Neck: No tracheal deviation present. No thyromegaly present.  Respiratory: Effort normal. No  stridor. No respiratory distress.  +[place]  GI: He exhibits no distension. There is no abdominal tenderness.  Midline incision C/D/I and healing well. Colostomy in place with soft stool.   Musculoskeletal:     Comments: No edema or tenderness in extremities  Neurological: He is alert.  Motor: B/l UE: 4+/5 proximal to distal RLE: 4-4+/5 proximally, 5/5 ADF LLE: HF, KE 4/5, ADF 5/5  Skin: Skin is warm and dry.  Psychiatric:  Frustrated/agitated about current issues.     Results for orders placed or performed during the hospital encounter of 03/13/20 (from the past 48 hour(s))  Basic metabolic panel     Status: Abnormal   Collection Time: 03/24/20  3:02 AM  Result Value Ref Range   Sodium 129 (L) 135 - 145 mmol/L   Potassium 4.1 3.5 - 5.1 mmol/L   Chloride 95 (L) 98 - 111 mmol/L   CO2 24 22 - 32 mmol/L   Glucose, Bld 102 (H) 70 - 99 mg/dL    Comment: Glucose reference range applies only to samples taken after fasting for at least 8 hours.   BUN 21 8 - 23 mg/dL   Creatinine, Ser 0.61 0.61 - 1.24 mg/dL   Calcium 8.5 (L) 8.9 - 10.3 mg/dL   GFR calc non Af Amer >60 >60 mL/min   GFR calc Af Amer >60 >60 mL/min   Anion gap 10 5 - 15    Comment: Performed at [facility], [place] [address] [place], [place] [address]  Basic metabolic panel     Status: Abnormal   Collection Time: 03/25/20  3:14 AM  Result Value Ref Range   Sodium 131 (L) 135 - 145 mmol/L   Potassium 3.7 3.5 - 5.1 mmol/L   Chloride 95 (L) 98 - 111 mmol/L   CO2 27 22 - 32 mmol/L   Glucose, Bld 101 (H) 70 - 99 mg/dL    Comment: Glucose reference range applies only to samples taken after fasting for at least 8 hours.   BUN 19 8 - 23 mg/dL   Creatinine, Ser 0.55 (L) 0.61 - 1.24 mg/dL   Calcium 8.8 (L) 8.9 - 10.3 mg/dL   GFR calc non Af Amer >60 >60 mL/min   GFR calc Af Amer >60 >60 mL/min   Anion gap 9 5 - 15    Comment: Performed at [facility] [address], [place], [place] [address]   No results  found.     Medical Problem List and Plan: 1. Shuffling gait with poor safety awareness, poor endurance, weakness, DOE secondary to debility.  -patient may shower  -ELOS/Goals: 5-9 days/Supervision/Mod I  Admit to CIR 2.  Antithrombotics: -DVT/anticoagulation:  Pharmaceutical: Other (comment)--Eliquis  -antiplatelet therapy: N/A  3. Rib fracturs/back pain/Pain Management: Vicodin prn for pain.   Monitor with increased exertion.  4. Mood: LCSW to follow for evaluation and support.    -antipsychotic agents: N/A 5. Neuropsych: This patient is capable of making decisions on his own behalf. 6. Skin/Wound Care: Routine pressure relief measures. Added ensure and boost for supplement as appetite is poor but wants supplements 7. Fluids/Electrolytes/Nutrition: Strict I/Os. CMP ordered for tomorrow AM.  8. Hypoxia/DOE/Pnemonitis: Multifactorial due to Covid PNA, lung cancer, pericardial effusion and pneumonitis. Completed course of antibiotics 4/20. Continue Mucinex DM bid. Slow weekly prednisone taper recommended--will start taper as gout improving also. Humidification added and patient encouraged to ask for nebs as needed.   9. Stage IV Lung cancer s/p XRT: Chemo on hold: To follow up on outpatient basis.  10. Hyponatremia: Has been in 129-130 range. Asymptomatic. Continue demeclocycline bid  BMP ordered for tomorrow 11. Candida UTI: On diflucan Day# 6/7 12. Left hip hemi: THR, precautions for 6 weeks. No squats or twisting indefinitely.  13. Gout flare left knee: On colchicine with improvement of symptoms.   Bary Leriche, PA-C 03/25/2020  I have personally performed a face to face diagnostic evaluation, including, but not limited to relevant history and physical exam findings, of this patient and developed relevant assessment and plan.  Additionally, I have reviewed and concur with the physician assistant's documentation above.  Delice Lesch, MD, ABPMR

## 2020-03-25 NOTE — PMR Pre-admission (Signed)
PMR Admission Coordinator Pre-Admission Assessment  Patient: Anthony Curry is an 78 y.o., male MRN: [id number] DOB: Feb 04, 1941 Height: _0  (180.3 cm) Weight: 60.8 kg  Insurance Information HMO:     PPO:      PCP:      IPA:      80/20:      OTHER:  PRIMARY: Medicare a and b      Policy#: [id number]      Subscriber: pt Benefits:  Phone #: passport one online     Name: 4/21 Eff. Date: a 09/04/2004 and b 02/02/2005     Deduct: $1484      Out of Pocket Max: none      Life Max: none CIR: 100%      SNF: has been to SNF twice since 12/2019 so days limited Outpatient: 80%     Co-Pay: 20% Home Health: 100%      Co-Pay: NONE DME: 80%     Co-Pay: 20% Providers: Pt choice  SECONDARY: BCBS supplement      Policy#: [id number]       Financial Counselor:       Phone#:   The "Data Collection Information Summary" for patients in Inpatient Rehabilitation Facilities with attached "Privacy Act [place] Records" was provided and verbally reviewed with: Patient and family  Emergency Contact Information Contact Information    Name Relation Home Work [place], [name] Daughter [phone]     [name], [name] Spouse   [phone]      Current Medical History  Patient Admitting Diagnosis: Debility  History of Present Illness: Anthony Curry is an 79 year old male with history of stage IV lung cancer s/p XRT, pericardial effusion, A fib-on Eliquis, chronic hyponatremia, Covid 19 PNA dx 12/2019 as well as multiple recent admissions for 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 He ws admitted on 03/13/20 with fall onto his head with LOC and reports of dizziness, cough and weakness. He was found to have worsening of hyponatremia with  Na-124 and was started on gentle hydration and demeclocyline added--Remeron d/c to prevent further drop.  CT chest showed increase in pericardial effusion with new right 4,6,7 and 8th rib fractures, increase in left lung base effusion with air space disease question due to aspiration pneumonitis as well as RLL changes question post traumatic v/s disease worsening.   Repeat 2D echo done revealing moderate to large pericardial effusion--slightly larger c/w 01/08/20 and no tamponade physiology. Dr. 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 Hem/Onc consulted for input 4/13 and felt that X rays without definite disease progression --->patient started on Augmentin X 7 days due to concerns of aspiration/immune mediated pneumonitis as well as steroids with recommendations for slow taper after 1-2 weeks. Hospital course significant for gout flare left knee with effusion--refused tap, candida UTI with dysuria, urinary retention--foley changed out on 04/16, hypoxia with activity as well as back/hip pain. He continues to have anxiety as well as tachycardia with HR up to 120's activity, shuffling gait with poor safety awareness, needs verbal and tactile cues to follow commands and requires multiple rest breaks with cues for pursed breathing due to recover from significant  dyspnea with activity.  Patient's medical record from [facility] has been reviewed by the rehabilitation admission coordinator and physician.  Past Medical History  Past Medical History:  Diagnosis Date  . Atrial fibrillation, chronic ([place])   . Fall   . Hyponatremia   . Pericardial effusion   . Stage 4 lung cancer, unspecified laterality ([place])     Family History   family history is not on file.  Prior Rehab/Hospitalizations Has the patient had prior rehab or hospitalizations prior to admission?  Yes SNF at [facility] and [place] in past few months. Patient had very traumatic experiences at both  Has the patient had major surgery during 100 days prior to admission? Yes   Current Medications  Current Facility-Administered Medications:  .  acetaminophen (TYLENOL) tablet 650 mg, 650 mg, Oral, Q6H PRN **OR** acetaminophen (TYLENOL) suppository 650 mg, 650 mg, Rectal, Q6H PRN, Karmen Bongo, MD .  ALPRAZolam Duanne Moron) tablet 0.25 mg, 0.25 mg, Oral, QHS PRN, Karmen Bongo, MD, 0.25 mg at 03/23/20 2104 .  apixaban (ELIQUIS) tablet 5 mg, 5 mg, Oral, BID, Caren Griffins, MD, 5 mg at 03/25/20 0841 .  bisacodyl (DULCOLAX) EC tablet 5 mg, 5 mg, Oral, Daily PRN, Karmen Bongo, MD .  Chlorhexidine Gluconate Cloth 2 % PADS 6 each, 6 each, Topical, Daily, Osei-Bonsu, Iona Beard, MD, 6 each at 03/25/20 [phone] .  colchicine tablet 0.6 mg, 0.6 mg, Oral, BID, Charlynne Cousins, MD, 0.6 mg at 03/25/20 0841 .  demeclocycline (DECLOMYCIN) tablet 300 mg, 300 mg, Oral, BID, Osei-Bonsu, George, MD, 300 mg at 03/25/20 0840 .  dextromethorphan-guaiFENesin (MUCINEX DM) 30-600 MG per 12 hr tablet 1 tablet, 1 tablet, Oral, BID, Osei-Bonsu, George, MD, 1 tablet at 03/25/20 0840 .  diltiazem (CARDIZEM CD) 24 hr capsule 180 mg, 180 mg, Oral, Daily, Karmen Bongo, MD, 180 mg at 03/25/20 0840 .  docusate sodium (COLACE) capsule 100 mg, 100 mg, Oral, BID, Karmen Bongo, MD, 100 mg at 03/25/20 0841 .  fluconazole (DIFLUCAN) tablet 100 mg, 100 mg, Oral, Daily, Caren Griffins, MD, 100 mg at 03/25/20 0847 .  guaiFENesin-dextromethorphan (ROBITUSSIN DM) 100-10 MG/5ML syrup 5 mL, 5 mL, Oral, Q4H PRN, Caren Griffins, MD, 5 mL at 03/23/20 1939 .  hydrALAZINE (APRESOLINE) injection 5 mg, 5 mg, Intravenous, Q4H PRN, Karmen Bongo, MD, 5 mg at 03/25/20 8022 .  HYDROcodone-acetaminophen (NORCO/VICODIN) 5-325 MG per tablet 1-2 tablet, 1-2 tablet, Oral, Q4H PRN, Karmen Bongo, MD, 2 tablet at 03/17/20  2056 .  ipratropium-albuterol (DUONEB) 0.5-2.5 (3) MG/3ML nebulizer solution 3 mL, 3 mL, Nebulization, Q6H PRN, Blount, Xenia T, NP, 3 mL at 03/15/20 1426 .  levothyroxine (SYNTHROID) tablet 50 mcg, 50 mcg, Oral, Q0600, Karmen Bongo, MD, 50 mcg at 03/25/20 [phone] .  lidocaine (LIDODERM) 5 % 1 patch, 1 patch, Transdermal, Q24H, Karmen Bongo, MD, 1 patch at 03/24/20 2216 .  menthol-cetylpyridinium (CEPACOL) lozenge 3 mg, 1 lozenge, Oral, PRN, Opyd, Ilene Qua, MD, 3 mg at 03/23/20 1940 .  morphine 2 MG/ML injection 2 mg, 2 mg, Intravenous, Q2H PRN, Karmen Bongo, MD .  ondansetron [facility]) tablet 4 mg, 4 mg, Oral, Q6H PRN **OR** ondansetron (ZOFRAN) injection 4 mg, 4 mg, Intravenous, Q6H PRN, Karmen Bongo, MD .  polyethylene glycol (MIRALAX / GLYCOLAX) packet 17 g, 17 g, Oral, Daily PRN, Karmen Bongo, MD .  predniSONE (DELTASONE) tablet 60 mg, 60 mg, Oral, Q breakfast, Curcio, Kristin R, NP, 60 mg at 03/25/20 0840 .  sodium chloride (OCEAN)  0.65 % nasal spray 1 spray, 1 spray, Each Nare, PRN, Dahal, Binaya, MD .  sodium chloride flush (NS) 0.9 % injection 3 mL, 3 mL, Intravenous, Q12H, Karmen Bongo, MD, 3 mL at 03/25/20 0842 .  tamsulosin (FLOMAX) capsule 0.4 mg, 0.4 mg, Oral, BID, Karmen Bongo, MD, 0.4 mg at 03/25/20 0841 .  zolpidem (AMBIEN) tablet 5 mg, 5 mg, Oral, QHS PRN, Karmen Bongo, MD, 5 mg at 03/24/20 2233  Patients Current Diet:  Diet Order            Diet regular Room service appropriate? Yes; Fluid consistency: Thin; Fluid restriction: 1500 mL Fluid  Diet effective now              Precautions / Restrictions Precautions Precautions: Fall Precaution Comments: colostomy; foley Restrictions Weight Bearing Restrictions: No Other Position/Activity Restrictions: none listed or endorsed following recent hip surgery - confirmed with Josh, RN   Has the patient had 2 or more falls or a fall with injury in the past year? Yes  Prior Activity Level Limited  Community (1-2x/wk): decline in function since 12/2019 when he had COVID 19  Prior Functional Level Self Care: Did the patient need help bathing, dressing, using the toilet or eating? Independent  Indoor Mobility: Did the patient need assistance with walking from room to room (with or without device)? Independent  Stairs: Did the patient need assistance with internal or external stairs (with or without device)? Independent  Functional Cognition: Did the patient need help planning regular tasks such as shopping or remembering to take medications? Needed some help  Home Assistive Devices / [place] Devices/Equipment: None Home Equipment: Walker - 2 wheels, Shower seat  Prior Device Use: Indicate devices/aids used by the patient prior to current illness, exacerbation or injury? Walker  Current Functional Level Cognition  Overall Cognitive Status: (NT formally) Current Attention Level: Selective Orientation Level: Oriented X4 Following Commands: Follows one step commands consistently Safety/Judgement: Decreased awareness of safety, Decreased awareness of deficits General Comments: anxious with mobility and with depressed affect about current situation, eager to d/c to CIR    Extremity Assessment (includes Sensation/Coordination)  Upper Extremity Assessment: Defer to OT evaluation  Lower Extremity Assessment: Generalized weakness, LLE deficits/detail LLE Deficits / Details: swelling and pain in L knee, decr WB tolerance, decr extension through joint    ADLs  Overall ADL's : Needs assistance/impaired Eating/Feeding: Modified independent, Sitting Eating/Feeding Details (indicate cue type and reason): pt eating breakfast end of session while seated in recliner  Grooming: Min guard, Standing, Oral care, Brushing hair, Sitting Grooming Details (indicate cue type and reason): While standing at the sink with pt requiring 2 seated rest breaks.  Upper Body Bathing: Min guard,  Sitting Lower Body Bathing: Moderate assistance, Sit to/from stand Upper Body Dressing : Set up, Sitting Lower Body Dressing: Set up, Sitting/lateral leans Lower Body Dressing Details (indicate cue type and reason): To don/doff socks while seated EOB. Pt incorporated use of figure four position. Required mod encouragement to complete as independently as possible.  Toilet Transfer: Minimal assistance, +2 for physical assistance, Stand-pivot, RW Toilet Transfer Details (indicate cue type and reason): simulated via transfer to [place] and Hygiene: Minimal assistance, Sit to/from stand Functional mobility during ADLs: Min guard, Rolling walker General ADL Comments: educated in pursed lip breathing techniques, monitoring of symptoms as he tends to rush through activities as he fatigues    Mobility  Overal bed mobility: Needs Assistance Bed Mobility: Supine to Sit, Sit  to Supine Supine to sit: Min guard Sit to supine: Min guard General bed mobility comments: min guard for supine to sit for safety with use of bedrails and increased time, min assist for sit to supine for LE lifting into bed, positioning.    Transfers  Overall transfer level: Needs assistance Equipment used: 4-wheeled walker Transfers: Sit to/from Stand Sit to Stand: Min guard General transfer comment: cues for hand placement, and cues for brake safety with with the Rollator    Ambulation / Gait / Stairs / Wheelchair Mobility  Ambulation/Gait Ambulation/Gait assistance: Counsellor (Feet): 80 Feet(then 75 feet and then 70 feet with rollator) Assistive device: 4-wheeled walker Gait Pattern/deviations: Step-through pattern General Gait Details: cues for posture, proximity to rollotor, pt wit mild waggle of rollator, improved stability with distance.  SpO2 on RA with gait 93-93% with HR in th 110's.  On 2L Painter, SpO2 between 95-97 with HR rising to the 100's Gait velocity: decr Gait  velocity interpretation: <1.8 ft/sec, indicate of risk for recurrent falls    Posture / Balance Balance Overall balance assessment: Needs assistance, History of Falls Sitting-balance support: Feet supported, No upper extremity supported Sitting balance-Leahy Scale: Good Standing balance support: Bilateral upper extremity supported, During functional activity Standing balance-Leahy Scale: Poor Standing balance comment: reliant on external support.    Special needs/care consideration Oxygen 2 liters , Skin MASD to buttocks and sacrum; ecchymosis to arms bilaterally, Behavioral consideration patietn needs encouragement and feeling of safety and to be heard, Special service needs Colostomy and Designated visitor [name] and TBD if son [name] or daughter, [name] to be the second visitor. I have emphasized that both children could not swithch off when they are in town as they have done on acute   Previous Home Environment (from acute therapy documentation) Living Arrangements: Spouse/significant other  Lives With: Spouse Available Help at Discharge: Family, Available 24 hours/day(wife; two children are from out of state and visit) Type of Home: Other(Comment)(condo) Home Layout: One level Home Access: Building control surveyor Shower/Tub: Multimedia programmer: Standard Bathroom Accessibility: Yes How Accessible: Accessible via Hooper: Other (Comment)(ostomy supplies) Additional Comments: will need to clarify home setup and DME  Discharge Living Setting Plans for Discharge Living Setting: Patient's home, Other (Comment)(condo) Type of Home at Discharge: (condo) Discharge Home Layout: One level Discharge Home Access: Elevator Discharge Bathroom Shower/Tub: Walk-in shower Discharge Bathroom Toilet: Standard Discharge Bathroom Accessibility: Yes How Accessible: Accessible via walker Does the patient have any problems obtaining your medications?: No  Social/Family/Support  Systems Patient Roles: Spouse, Parent Contact Information: wife, [name] and daughter, [name] Anticipated Caregiver: wife Anticipated Ambulance person Information: see above Ability/Limitations of Caregiver: wife can provide supervision level; wife manages his colostomy Caregiver Availability: 24/7 Discharge Plan Discussed with Primary Caregiver: Yes Is Caregiver In Agreement with Plan?: Yes Does Caregiver/Family have Issues with Lodging/Transportation while Pt is in Rehab?: No  Goals Patient/Family Goal for Rehab: Mod I to supervision with PT and OT Expected length of stay: ELOS 7 to 10 days Pt/Family Agrees to Admission and willing to participate: Yes Program Orientation Provided & Reviewed with Pt/Caregiver Including Roles  & Responsibilities: Yes  Decrease burden of Care through IP rehab admission: n/a  Possible need for SNF placement upon discharge: Has been to [facility] and [facility] since 01/2020 with emotionally traumatic experiences  Patient Condition: I have reviewed medical records from [facility] , spoken with CSW, and patient, spouse and son. I  met with patient at the bedside for inpatient rehabilitation assessment.  Patient will benefit from ongoing PT and OT, can actively participate in 3 hours of therapy a day 5 days of the week, and can make measurable gains during the admission.  Patient will also benefit from the coordinated team approach during an Inpatient Acute Rehabilitation admission.  The patient will receive intensive therapy as well as Rehabilitation physician, nursing, social worker, and care management interventions.  Due to bladder management, bowel management, safety, skin/wound care, disease management, medication administration, pain management and patient education the patient requires 24 hour a day rehabilitation nursing.  The patient is currently min assist overall with mobility and basic ADLs.  Discharge setting and therapy post  discharge at home with home health is anticipated.  Patient has agreed to participate in the Acute Inpatient Rehabilitation Program and will admit today.  Preadmission Screen Completed By:  Cleatrice Burke, 03/25/2020 10:55 AM ______________________________________________________________________   Discussed status with Dr. Posey Pronto  on 03/25/2020 at 61 and received approval for admission today.  Admission Coordinator:  Cleatrice Burke, RN, time 1106 Date  03/25/2020   Assessment/Plan: Diagnosis: Debility 1. Does the need for close, 24 hr/day Medical supervision in concert with the patient's rehab needs make it unreasonable for this patient to be served in a less intensive setting? Yes and Potentially 2. Co-Morbidities requiring supervision/potential complications: stage IV lung cancer s/p XRT, pericardial effusion, A fib, chronic hyponatremia, Covid 19 PNA dx 12/2019, perforated diverticulitis requiring sigmoid colectomy with colostomy complicated by urinary retention, left hand ischemia requiring left brachial embolectomy, recent hip fracture 02/17/20 s/p left hip hemi 3. Due to safety, disease management and patient education, does the patient require 24 hr/day rehab nursing? Potentially 4. Does the patient require coordinated care of a physician, rehab nurse, PT, OT to address physical and functional deficits in the context of the above medical diagnosis(es)? Potentially Addressing deficits in the following areas: balance, endurance, locomotion, strength, transferring, bathing, dressing, toileting, psychosocial support and Endurance 5. Can the patient actively participate in an intensive therapy program of at least 3 hrs of therapy 5 days a week? Yes 6. The potential for patient to make measurable gains while on inpatient rehab is excellent and good 7. Anticipated functional outcomes upon discharge from inpatient rehab: modified independent and supervision PT, modified independent and  supervision OT, n/a SLP 8. Estimated rehab length of stay to reach the above functional goals is: 5-7 days. 9. Anticipated discharge destination: Home 10. Overall Rehab/Functional Prognosis: good   MD Signature: Delice Lesch, MD, ABPMR

## 2020-03-25 NOTE — H&P (Signed)
Physical Medicine and Rehabilitation Admission H&P    Chief Complaint  Patient presents with  . Debility    HPI: Anthony Curry is an 79 year old male with history of stage IV lung cancer s/p XRT, pericardial effusion, A fib-on Eliquis, chronic hyponatremia, multiple recent admissions for perforated diverticulitis requiring sigmoid colectomy with colostomy, found to have Covid 19 PNA and hospital course complicated by urinary retention, left hand ischemia requiring left brachial embolectomy. History taken from chart review, wife, and patient, due to patient's agitation with current situation. He was readmitted on 02/17/20 with left femoral neck fracture s/p left hip hemi and was discharged to SNF for therapy but patient elected to go home next day. He ws admitted again on 03/13/20 after a fall, hitting his head with LOC and subsequent dizziness, cough, and weakness. He was found to have worsening of hyponatremia with Na-124 and was started on gentle IV hydration. Demeclocyline added--Remeron d/ced to prevent further drop.  CT chest showed increased pericardial effusion with new right 4,6,7 and 8th rib fractures, increase in left lung base effusion with air space disease question due to aspiration pneumonitis as well as RLL changes question post traumatic v/s disease worsening.   Repeat 2D echo showing moderate/large pleural effusion --slightly larger compared to 01/08/20 and no tamponade physiology. Dr. Margaretann Loveless consulted for input and felt that persistent effusion secondary to metastatic lung CA and no significant benefit with intervention and potential risks as no tamponade noted. Hem/Onc consulted for input 03/17/20 and felt that X rays without definite disease progression --->patient started on Augmentin X 7 days due to concerns of aspiration/immune mediated pneumonitis as well as steroids with recommendations for slow taper after 1-2 weeks. Hospital course further complicated by gout flare left knee  with effusion--refused tap, candida UTI with dysuria, urinary retention--foley changed out on 04/16, hypoxia with activity as well as back/hip pain. He continues to have anxiety as well as tachycardia with HR up to 120's activity, shuffling gait with poor safety awareness, needs verbal and tactile cues to follow commands and requires multiple rest breaks with cues for pursed breathing due to recover from significant dyspnea with activity. CIR recommended due to functional decline. Please see preadmission from earlier today as well.   Review of Systems  Constitutional: Positive for malaise/fatigue.  HENT: Negative for hearing loss and tinnitus.   Eyes: Negative for blurred vision and double vision.  Respiratory: Positive for cough and shortness of breath.        Voice hoarse since admission.  Cardiovascular: Negative for chest pain and palpitations.  Gastrointestinal: Negative for abdominal pain and heartburn.  Genitourinary:       Foley in place  Musculoskeletal: Positive for back pain (due to rib fractures) and joint pain.  Neurological: Positive for weakness. Negative for dizziness and headaches.  Psychiatric/Behavioral: The patient is nervous/anxious.    Past Medical History:  Diagnosis Date  . Ankle fracture    right  . Atrial fibrillation, chronic ([place])   . COVID-19 12/2019  . Depression   . Epididymoorchitis 01/30/2020   with right hydrocele  . Hyponatremia   . Multiple falls   . Perforated diverticulum   . Pericardial effusion   . Stage 4 lung cancer, unspecified laterality [facility])     Past Surgical History:  Procedure Laterality Date  . COLON SURGERY     sigmoid colectomy with colostomy for ruptured diverticulum  . hemiarthroplasty Left 02/2020     Family History  Problem Relation Age  of Onset  . Lung cancer Mother   . Heart attack Father   . Brain cancer Brother      Social History:  Married. Independent prior to Jan 2021. He reports that he has quit smoking. He  has a 30.00 pack-year smoking history. He has never used smokeless tobacco. He reports previous alcohol use. He reports that he does not use drugs.   Allergies  Allergen Reactions  . Aspirin Other (See Comments)    Cannot consume as a result of him taking Eliquis (Blood thinner)    Medications Prior to Admission  Medication Sig Dispense Refill  . ALPRAZolam (XANAX) 0.25 MG tablet Take 0.25 mg by mouth at bedtime as needed for anxiety.    [name] apixaban (ELIQUIS) 5 MG TABS tablet Take 5 mg by mouth 2 (two) times daily.    [name] demeclocycline (DECLOMYCIN) 150 MG tablet Take 150 mg by mouth 2 (two) times daily.    [name] diltiazem (DILACOR XR) 180 MG 24 hr capsule Take 180 mg by mouth daily.    [name] HYDROcodone-acetaminophen (NORCO/VICODIN) 5-325 MG tablet Take 1 tablet by mouth 3 (three) times daily as needed for moderate pain.    [name] levothyroxine (SYNTHROID) 50 MCG tablet Take 50 mcg by mouth daily before breakfast.    . mirtazapine (REMERON) 30 MG tablet Take 30 mg by mouth at bedtime.    . Nivolumab (OPDIVO IV) Inject into the vein See admin instructions. Inject as directed every month.    . potassium chloride SA (KLOR-CON) 20 MEQ tablet Take 40 mEq by mouth 2 (two) times daily.     . tamsulosin (FLOMAX) 0.4 MG CAPS capsule Take 0.4 mg by mouth in the morning and at bedtime.      Drug Regimen Review  Drug regimen was reviewed and remains appropriate with no significant issues identified  Home: Home Living Family/patient expects to be discharged to:: Private residence Living Arrangements: Spouse/significant other Available Help at Discharge: Family, Available 24 hours/day(wife; two children are from out of state and visit) Type of Home: Other(Comment)(condo) Home Access: Elevator Home Layout: One level Bathroom Shower/Tub: Multimedia programmer: Standard Bathroom Accessibility: Yes Home Equipment: Environmental consultant - 2 wheels, Shower seat Additional Comments: will need to clarify home setup and  DME  Lives With: Spouse   Functional History: Prior Function Level of Independence: Independent with assistive device(s) Comments: reports typically ambulatory with RW, and able to perform ADL without assist  Functional Status:  Mobility: Bed Mobility Overal bed mobility: Needs Assistance Bed Mobility: Supine to Sit, Sit to Supine Supine to sit: Min guard Sit to supine: Min guard General bed mobility comments: min guard for supine to sit for safety with use of bedrails and increased time, min assist for sit to supine for LE lifting into bed, positioning. Transfers Overall transfer level: Needs assistance Equipment used: 4-wheeled walker Transfers: Sit to/from Stand Sit to Stand: Min guard General transfer comment: cues for hand placement, and cues for brake safety with with the Rollator Ambulation/Gait Ambulation/Gait assistance: Min guard Gait Distance (Feet): 80 Feet(then 75 feet and then 70 feet with rollator) Assistive device: 4-wheeled walker Gait Pattern/deviations: Step-through pattern General Gait Details: cues for posture, proximity to rollotor, pt wit mild waggle of rollator, improved stability with distance.  SpO2 on RA with gait 93-93% with HR in th 110's.  On 2L [place], SpO2 between 95-97 with HR rising to the 100's Gait velocity: decr Gait velocity interpretation: <1.8 ft/sec, indicate of risk for recurrent falls  ADL: ADL Overall ADL's : Needs assistance/impaired Eating/Feeding: Modified independent, Sitting Eating/Feeding Details (indicate cue type and reason): pt eating breakfast end of session while seated in recliner  Grooming: Min guard, Standing, Oral care, Brushing hair, Sitting Grooming Details (indicate cue type and reason): While standing at the sink with pt requiring 2 seated rest breaks.  Upper Body Bathing: Min guard, Sitting Lower Body Bathing: Moderate assistance, Sit to/from stand Upper Body Dressing : Set up, Sitting Lower Body Dressing: Set up,  Sitting/lateral leans Lower Body Dressing Details (indicate cue type and reason): To don/doff socks while seated EOB. Pt incorporated use of figure four position. Required mod encouragement to complete as independently as possible.  Toilet Transfer: Minimal assistance, +2 for physical assistance, Stand-pivot, RW Toilet Transfer Details (indicate cue type and reason): simulated via transfer to recliner Toileting- Clothing Manipulation and Hygiene: Minimal assistance, Sit to/from stand Functional mobility during ADLs: Min guard, Rolling walker General ADL Comments: educated in pursed lip breathing techniques, monitoring of symptoms as he tends to rush through activities as he fatigues  Cognition: Cognition Overall Cognitive Status: (NT formally) Orientation Level: Oriented X4 Cognition Arousal/Alertness: Awake/alert Behavior During Therapy: WFL for tasks assessed/performed, Flat affect Overall Cognitive Status: (NT formally) Area of Impairment: Safety/judgement, Problem solving, Memory, Following commands Orientation Level: Situation Current Attention Level: Selective Memory: Decreased short-term memory Following Commands: Follows one step commands consistently Safety/Judgement: Decreased awareness of safety, Decreased awareness of deficits Problem Solving: Decreased initiation, Requires verbal cues, Requires tactile cues General Comments: anxious with mobility and with depressed affect about current situation, eager to d/c to CIR   Blood pressure 140/68, pulse 73, temperature 97.8 F (36.6 C), temperature source Oral, resp. rate 18, height 5\' 11"  (1.803 m), weight 60.8 kg, SpO2 93 %. Physical Exam  Nursing note and vitals reviewed. Constitutional: He appears well-developed. He appears cachectic.  Frail  Eyes: EOM are normal. Right eye exhibits no discharge. Left eye exhibits no discharge.  Neck: No tracheal deviation present. No thyromegaly present.  Respiratory: Effort normal. No  stridor. No respiratory distress.  +[place]  GI: He exhibits no distension. There is no abdominal tenderness.  Midline incision C/D/I and healing well. Colostomy in place with soft stool.   Musculoskeletal:     Comments: No edema or tenderness in extremities  Neurological: He is alert.  Motor: B/l UE: 4+/5 proximal to distal RLE: 4-4+/5 proximally, 5/5 ADF LLE: HF, KE 4/5, ADF 5/5  Skin: Skin is warm and dry.  Psychiatric:  Frustrated/agitated about current issues.     Results for orders placed or performed during the hospital encounter of 03/13/20 (from the past 48 hour(s))  Basic metabolic panel     Status: Abnormal   Collection Time: 03/24/20  3:02 AM  Result Value Ref Range   Sodium 129 (L) 135 - 145 mmol/L   Potassium 4.1 3.5 - 5.1 mmol/L   Chloride 95 (L) 98 - 111 mmol/L   CO2 24 22 - 32 mmol/L   Glucose, Bld 102 (H) 70 - 99 mg/dL    Comment: Glucose reference range applies only to samples taken after fasting for at least 8 hours.   BUN 21 8 - 23 mg/dL   Creatinine, Ser 0.61 0.61 - 1.24 mg/dL   Calcium 8.5 (L) 8.9 - 10.3 mg/dL   GFR calc non Af Amer >60 >60 mL/min   GFR calc Af Amer >60 >60 mL/min   Anion gap 10 5 - 15    Comment: Performed at [facility], [place] [address] [place], [place] [address]  Basic metabolic panel     Status: Abnormal   Collection Time: 03/25/20  3:14 AM  Result Value Ref Range   Sodium 131 (L) 135 - 145 mmol/L   Potassium 3.7 3.5 - 5.1 mmol/L   Chloride 95 (L) 98 - 111 mmol/L   CO2 27 22 - 32 mmol/L   Glucose, Bld 101 (H) 70 - 99 mg/dL    Comment: Glucose reference range applies only to samples taken after fasting for at least 8 hours.   BUN 19 8 - 23 mg/dL   Creatinine, Ser 0.55 (L) 0.61 - 1.24 mg/dL   Calcium 8.8 (L) 8.9 - 10.3 mg/dL   GFR calc non Af Amer >60 >60 mL/min   GFR calc Af Amer >60 >60 mL/min   Anion gap 9 5 - 15    Comment: Performed at [facility] [address], [place], [place] [address]   No results  found.     Medical Problem List and Plan: 1. Shuffling gait with poor safety awareness, poor endurance, weakness, DOE secondary to debility.  -patient may shower  -ELOS/Goals: 5-9 days/Supervision/Mod I  Admit to CIR 2.  Antithrombotics: -DVT/anticoagulation:  Pharmaceutical: Other (comment)--Eliquis  -antiplatelet therapy: N/A  3. Rib fracturs/back pain/Pain Management: Vicodin prn for pain.   Monitor with increased exertion.  4. Mood: LCSW to follow for evaluation and support.    -antipsychotic agents: N/A 5. Neuropsych: This patient is capable of making decisions on his own behalf. 6. Skin/Wound Care: Routine pressure relief measures. Added ensure and boost for supplement as appetite is poor but wants supplements 7. Fluids/Electrolytes/Nutrition: Strict I/Os. CMP ordered for tomorrow AM.  8. Hypoxia/DOE/Pnemonitis: Multifactorial due to Covid PNA, lung cancer, pericardial effusion and pneumonitis. Completed course of antibiotics 4/20. Continue Mucinex DM bid. Slow weekly prednisone taper recommended--will start taper as gout improving also. Humidification added and patient encouraged to ask for nebs as needed.   9. Stage IV Lung cancer s/p XRT: Chemo on hold: To follow up on outpatient basis.  10. Hyponatremia: Has been in 129-130 range. Asymptomatic. Continue demeclocycline bid  BMP ordered for tomorrow 11. Candida UTI: On diflucan Day# 6/7 12. Left hip hemi: THR, precautions for 6 weeks. No squats or twisting indefinitely.  13. Gout flare left knee: On colchicine with improvement of symptoms.   Bary Leriche, PA-C 03/25/2020  I have personally performed a face to face diagnostic evaluation, including, but not limited to relevant history and physical exam findings, of this patient and developed relevant assessment and plan.  Additionally, I have reviewed and concur with the physician assistant's documentation above.  Delice Lesch, MD, ABPMR  The patient's status has not changed.  The original post admission physician evaluation remains appropriate, and any changes from the pre-admission screening or documentation from the acute chart are noted above.   Delice Lesch, MD, ABPMR

## 2020-03-26 ENCOUNTER — Inpatient Hospital Stay (HOSPITAL_COMMUNITY): Payer: Medicare Other

## 2020-03-26 ENCOUNTER — Inpatient Hospital Stay (HOSPITAL_COMMUNITY): Payer: BLUE CROSS/BLUE SHIELD | Admitting: Physical Therapy

## 2020-03-26 ENCOUNTER — Inpatient Hospital Stay (HOSPITAL_COMMUNITY): Payer: BLUE CROSS/BLUE SHIELD | Admitting: Occupational Therapy

## 2020-03-26 ENCOUNTER — Ambulatory Visit (HOSPITAL_COMMUNITY): Payer: Medicare Other

## 2020-03-26 ENCOUNTER — Inpatient Hospital Stay (HOSPITAL_COMMUNITY): Payer: BLUE CROSS/BLUE SHIELD

## 2020-03-26 DIAGNOSIS — R5381 Other malaise: Principal | ICD-10-CM

## 2020-03-26 LAB — CBC WITH DIFFERENTIAL/PLATELET
Abs Immature Granulocytes: 0.22 10*3/uL — ABNORMAL HIGH (ref 0.00–0.07)
Basophils Absolute: 0 10*3/uL (ref 0.0–0.1)
Basophils Relative: 0 %
Eosinophils Absolute: 0 10*3/uL (ref 0.0–0.5)
Eosinophils Relative: 0 %
HCT: 39.5 % (ref 39.0–52.0)
Hemoglobin: 13.3 g/dL (ref 13.0–17.0)
Immature Granulocytes: 2 %
Lymphocytes Relative: 14 %
Lymphs Abs: 1.5 10*3/uL (ref 0.7–4.0)
MCH: 29 pg (ref 26.0–34.0)
MCHC: 33.7 g/dL (ref 30.0–36.0)
MCV: 86.2 fL (ref 80.0–100.0)
Monocytes Absolute: 1.2 10*3/uL — ABNORMAL HIGH (ref 0.1–1.0)
Monocytes Relative: 12 %
Neutro Abs: 7.4 10*3/uL (ref 1.7–7.7)
Neutrophils Relative %: 72 %
Platelets: 469 10*3/uL — ABNORMAL HIGH (ref 150–400)
RBC: 4.58 MIL/uL (ref 4.22–5.81)
RDW: 15 % (ref 11.5–15.5)
WBC: 10.4 10*3/uL (ref 4.0–10.5)
nRBC: 0 % (ref 0.0–0.2)

## 2020-03-26 LAB — COMPREHENSIVE METABOLIC PANEL
ALT: 25 U/L (ref 0–44)
AST: 19 U/L (ref 15–41)
Albumin: 3 g/dL — ABNORMAL LOW (ref 3.5–5.0)
Alkaline Phosphatase: 140 U/L — ABNORMAL HIGH (ref 38–126)
Anion gap: 10 (ref 5–15)
BUN: 19 mg/dL (ref 8–23)
CO2: 28 mmol/L (ref 22–32)
Calcium: 9.2 mg/dL (ref 8.9–10.3)
Chloride: 95 mmol/L — ABNORMAL LOW (ref 98–111)
Creatinine, Ser: 0.66 mg/dL (ref 0.61–1.24)
GFR calc Af Amer: >60 mL/min (ref >60)
GFR calc non Af Amer: >60 mL/min (ref >60)
Glucose, Bld: 95 mg/dL (ref 70–99)
Potassium: 3.3 mmol/L — ABNORMAL LOW (ref 3.5–5.1)
Sodium: 133 mmol/L — ABNORMAL LOW (ref 135–145)
Total Bilirubin: 0.4 mg/dL (ref 0.3–1.2)
Total Protein: 5.4 g/dL — ABNORMAL LOW (ref 6.5–8.1)

## 2020-03-26 MED ORDER — FLUCONAZOLE 100 MG PO TABS
100.0000 mg | ORAL_TABLET | Freq: Every day | ORAL | Status: DC
  Administered 2020-03-27 – 2020-03-30 (×4): 100 mg via ORAL
  Filled 2020-03-26 (×4): qty 1

## 2020-03-26 MED ORDER — PREDNISONE 20 MG PO TABS: 30.0000 mg | ORAL_TABLET | Freq: Every day | ORAL | Status: DC

## 2020-03-26 MED ORDER — MIRTAZAPINE 15 MG PO TABS: 15.0000 mg | ORAL_TABLET | Freq: Every day | ORAL | Status: DC

## 2020-03-26 MED ORDER — PREDNISONE 10 MG PO TABS: 5.0000 mg | ORAL_TABLET | Freq: Every day | ORAL | Status: DC

## 2020-03-26 MED ORDER — POTASSIUM CHLORIDE CRYS ER 20 MEQ PO TBCR
20.0000 meq | EXTENDED_RELEASE_TABLET | Freq: Two times a day (BID) | ORAL | Status: DC
  Administered 2020-03-26 – 2020-04-01 (×13): 20 meq via ORAL
  Filled 2020-03-26 (×13): qty 1

## 2020-03-26 MED ORDER — PREDNISONE 20 MG PO TABS
50.0000 mg | ORAL_TABLET | Freq: Every day | ORAL | Status: AC
  Administered 2020-03-27 – 2020-04-01 (×6): 50 mg via ORAL
  Filled 2020-03-26 (×6): qty 2

## 2020-03-26 MED ORDER — MIRTAZAPINE 15 MG PO TBDP: 7.5000 mg | ORAL_TABLET | Freq: Every evening | ORAL | Status: DC | PRN

## 2020-03-26 MED ORDER — PREDNISONE 20 MG PO TABS: 40.0000 mg | ORAL_TABLET | Freq: Every day | ORAL | Status: DC

## 2020-03-26 MED ORDER — PREDNISONE 20 MG PO TABS: 20.0000 mg | ORAL_TABLET | Freq: Every day | ORAL | Status: DC

## 2020-03-26 MED ORDER — PREDNISONE 10 MG PO TABS: 10.0000 mg | ORAL_TABLET | Freq: Every day | ORAL | Status: DC

## 2020-03-26 NOTE — Progress Notes (Signed)
Inpatient Rehabilitation  Patient information reviewed and entered into eRehab system by Tomio Kirk M. Gregory Barrick, M.A., CCC/SLP, PPS Coordinator.  Information including medical coding, functional ability and quality indicators will be reviewed and updated through discharge.    

## 2020-03-26 NOTE — Evaluation (Addendum)
Physical Therapy Assessment and Plan  Patient Details  Name: Anthony Curry. MRN: [id number] Date of Birth: 1941/06/27  PT Diagnosis: Abnormality of gait, Difficulty walking and Impaired sensation Rehab Potential: Good ELOS: 5-7 days   Today's Date: 03/26/2020 PT Individual Time: 7124-5809 PT Individual Time Calculation (min): 55 min    Problem List:  Patient Active Problem List   Diagnosis Date Noted  . Debility 03/25/2020  . Pneumonitis   . Acute gout of left knee   . Candida UTI   . Supplemental oxygen dependent   . Pressure injury of skin 03/16/2020  . Hyponatremia 03/13/2020  . Fall (on)(from) sidewalk curb, initial encounter 03/13/2020  . Multiple fractures of rib involving four or more ribs 03/13/2020  . Diverticulitis 03/13/2020  . Hypothyroidism (acquired) 03/13/2020  . Stage 4 lung cancer, unspecified laterality ([place])   . Pericardial effusion   . Atrial fibrillation, chronic ([place])   . Left displaced femoral neck fracture ([place]) 02/17/2020  . Pericardial effusion 02/17/2020  . Thromboembolism ([place]) 02/17/2020  . Hip fracture ([place]) 02/17/2020  . Diverticulitis 01/02/2020  . Acute diverticulitis 01/02/2020  . Ataxia 06/30/2019  . 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 Generalized weakness 06/28/2019  . Lactic acidosis 06/28/2019  . Abnormal urinalysis 06/28/2019  . Hypertension 12/07/2016  . 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 Acute urinary retention 09/09/2015  . Choledocholithiasis with cholecystitis 09/08/2015  . Common bile duct stone   . Encounter for antineoplastic immunotherapy 06/28/2015  . Atrial fibrillation ([place]) 06/11/2015  . Gynecomastia, male 06/10/2015  . Dizziness 05/11/2015  . Malignant neoplasm of lower lobe of right lung ([place]) 09/17/2014  . Neutropenic fever ([place]) 09/02/2014  . Neoplastic malignant related fatigue 08/13/2014  . Physical deconditioning 08/13/2014  . Hypoalbuminemia  08/13/2014  . Diarrhea 08/13/2014  . Antineoplastic chemotherapy induced anemia(285.3) 08/13/2014  . Atrial fibrillation with RVR ([place]) 07/28/2014  . HCAP (healthcare-associated pneumonia) 07/25/2014  . Alcoholism in remission ([place]) 07/25/2014  . Atrial flutter by electrocardiogram ([place]) 07/25/2014  . Swelling of limb 06/10/2014  . Lung cancer ([place]) 10/15/2013  . Hypotension 09/10/2013  . Adrenal mass ([place]) 09/09/2013  . 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 Pulmonary nodules 08/19/2013  . Anemia of chronic disease 07/03/2013  . BPH (benign prostatic hyperplasia) 07/03/2013  . GERD (gastroesophageal reflux disease) 07/03/2013  . Fall 07/03/2013  . Alcohol intoxication ([place]) 07/03/2013  . 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 Alcohol abuse 10/15/2011  . Hyponatremia 10/15/2011    Past Medical History:  Past Medical History:  Diagnosis Date  . 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 Adrenal mass ([place])   . Alcoholism in remission ([place])   . Anemia of chronic disease   . Ankle fracture    right  . 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 Anxiety   . Atrial fibrillation, chronic ([place])   . BPH (benign prostatic hyperplasia)   . Cancer ([place])    small cell/lung  . Complication of anesthesia 09/08/2015    JITTERY AFTER GALLBLADDER SURGERY  . COVID-19 12/2019  . Depression   . Epididymoorchitis 01/30/2020   with right hydrocele  . Femur fracture ([place])   . GERD (gastroesophageal reflux disease)   . History of radiation therapy 11/14/16, 11/18/16, 11/22/16   Left upper lung: 54 Gy in 3 fractions  . Hypertension   . Hyponatremia   . Multiple falls   . Neoplastic malignant related fatigue    IV tx. every 2 weeks last9-14-16 (Dr. Earlie Server), radiation  last tx. 6 months  . 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 Perforated diverticulum   . Pericardial effusion   . Persistent atrial fibrillation ([place])   . Protein calorie malnutrition ([place])   . Pulmonary  nodules   . Radiation 12/02/14-12/16/14   Left adrenal gland 30 Gy in 10 fractions  . Stage 4 lung cancer, unspecified laterality ([place])   . Urinary retention    Past Surgical History:  Past Surgical History:  Procedure Laterality Date  . ANKLE ARTHRODESIS  1995   rt fx-hardware in  . CARDIOVERSION N/A 09/29/2014   Procedure: CARDIOVERSION;  Surgeon: Josue Hector, MD;  Location: [facility] ENDOSCOPY;  Service: Cardiovascular;  Laterality: N/A;  . CHOLECYSTECTOMY  09/08/2015    laproscopic   . CHOLECYSTECTOMY N/A 09/08/2015   Procedure: LAPAROSCOPIC CHOLECYSTECTOMY WITH ATTEMPTED INTRAOPERATIVE CHOLANGIOGRAM ;  Surgeon: Fanny Skates, MD;  Location: [place];  Service: General;  Laterality: N/A;  . COLON SURGERY     sigmoid colectomy with colostomy for ruptured diverticulum  . COLONOSCOPY    . EMBOLECTOMY Left 01/06/2020   Procedure: Embolectomy left axillary, brachial, radial, and ulnar artery;  Surgeon: Elam Dutch, MD;  Location: [facility] OR;  Service: Vascular;  Laterality: Left;  . ERCP N/A 09/02/2015   Procedure: ENDOSCOPIC RETROGRADE CHOLANGIOPANCREATOGRAPHY (ERCP);  Surgeon: Arta Silence, MD;  Location: [facility] ENDOSCOPY;  Service: Endoscopy;  Laterality: N/A;  . ERCP N/A 09/07/2015   Procedure: ENDOSCOPIC RETROGRADE CHOLANGIOPANCREATOGRAPHY (ERCP);  Surgeon: Clarene Essex, MD;  Location: [facility] ENDOSCOPY;  Service: Endoscopy;  Laterality: N/A;  . EXCISION / CURETTAGE BONE CYST FINGER  2005   rt thumb  . hemiarthroplasty Left 02/2020  . HEMORRHOID SURGERY    . HIP ARTHROPLASTY Left 02/18/2020   Procedure: LEFT HIP HEMIARTHROPLASTY;  Surgeon: Marchia Bond, MD;  Location: [facility] ORS;  Service: Orthopedics;  Laterality: Left;  . LAPAROTOMY N/A 01/05/2020   Procedure: EXPLORATORY LAPAROTOMY sigmoid colon resection creation end colostomy for perforated diverticulitis and purulent peritonitis;  Surgeon: Kieth Brightly Arta Bruce, MD;  Location: [facility] ORS;  Service: General;  Laterality: N/A;  . Percutaneous  Cholecytostomy tube     IVR insertion 06-13-15(Dr. Vernard Gambles)- 08-28-15 remains in place to drainage bag  . TONSILLECTOMY    . TOTAL SHOULDER ARTHROPLASTY  02/03/2012   Procedure: TOTAL SHOULDER ARTHROPLASTY;  Surgeon: [name], MD;  Location: [place];  Service: Orthopedics;  Laterality: Left;    Assessment & Plan Clinical Impression: Patient is a 79 year old male with history of stage IV lung cancer s/p XRT, pericardial effusion, A fib-on Eliquis, chronic hyponatremia, Covid 19 PNA dx 12/2019 as well as multiple recent admissions for 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 He ws admitted on 03/13/20 with fall onto his head with LOC and reports of dizziness, cough and weakness. He was found to have worsening of hyponatremia with Na-124 and was started on gentle hydration and demeclocyline added--Remerond/c to prevent further drop. CT chest showed increase in pericardial effusion with new right 4,6,7 and 8th rib fractures, increase in left lung base effusion with air space disease question due to aspiration pneumonitis as well as RLL changes question post traumatic v/s disease worsening.   Repeat 2D echo done revealing moderate to large pericardial effusion--slightly larger c/w 01/08/20 and  no tamponade physiology. Dr. 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 Hem/Onc consulted for input 4/13 and felt that X rays without definite disease progression --->patient started on Augmentin X 7 days due to concerns of aspiration/immune mediated pneumonitis as well as steroids with  recommendations for slow taper after 1-2 weeks. Hospital course significant for gout flare left knee with effusion--refused tap, candida UTI with dysuria, urinary retention--foley changed out on 04/16, hypoxia with activity as well as back/hip pain. He continues to have anxiety as well as tachycardia with HR up to 120's activity, shuffling gait with poor safety awareness, needs verbal and tactile cues to follow commands and requires multiple rest breaks with cues for pursed breathing due to recover from significant dyspnea with activity. Patient transferred to CIR on 03/25/2020 .   Patient currently requires min with mobility secondary to decreased cardiorespiratoy endurance and decreased standing balance and decreased balance strategies.  Prior to hospitalization, patient was supervision with mobility and lived with Spouse in a Other(Comment) home.  Home access is  Elevator.  Patient will benefit from skilled PT intervention to maximize safe functional mobility, minimize fall risk and decrease caregiver burden for planned discharge home with 24 hour supervision.  Anticipate patient will benefit from follow up [place] at discharge.  PT - End of Session Activity Tolerance: Tolerates < 10 min activity with changes in vital signs Endurance Deficit: Yes Endurance Deficit Description: Decreased, SOB and elevated HR w/ all mobility. Pt on RA, spO2 >93%, HR up to 117 bpm w/ ambulation. PT Assessment Rehab Potential (ACUTE/IP ONLY): Good PT Barriers to Discharge: Medical stability;Other (comments) PT Barriers to Discharge Comments: Pt w/ stage IV lung cancer. PT Patient demonstrates impairments in the following area(s): Balance;Endurance;Safety;Sensory PT Transfers Functional Problem(s): Bed Mobility;Floor;Bed to Chair;Car;Furniture PT Locomotion Functional Problem(s): Stairs;Ambulation PT Plan PT Intensity: Minimum of 1-2 x/day ,45 to 90 minutes PT Frequency: 5 out of 7 days PT Duration Estimated Length of  Stay: 5-7 days PT Treatment/Interventions: Ambulation/gait training;Discharge planning;DME/adaptive equipment instruction;Functional mobility training;Pain management;Psychosocial support;Splinting/orthotics;Therapeutic Activities;UE/LE Strength taining/ROM;UE/LE Coordination activities;Therapeutic Exercise;Stair training;Skin care/wound management;Patient/family education;Neuromuscular re-education;Disease management/prevention;Community reintegration;Balance/vestibular training;Cognitive remediation/compensation PT Transfers Anticipated Outcome(s): supervision PT Locomotion Anticipated Outcome(s): supervision household gait w/ LRAD PT Recommendation Recommendations for Other Services: Neuropsych consult Follow Up Recommendations: Home health PT Patient destination: Home Equipment Recommended: To be determined Equipment Details: has rollator  Skilled Therapeutic Intervention  Pt in supine upon arrival and agreeable to therapy, denies pain but reports increased fatigue after not sleeping well last night. Pt emptied ostomy bag w/ set-up assist prior to initiating mobility. Performed bed mobility w/ supervision. Pt donned shirt and pants w/ supervision at EOB, CGA to stand and pull pants over hips in standing. Ambulated into hallway w/ rollator, CGA x75'. Pt reported feeling SOB, spO2 >93% on RA and HR 117bpm. Seated rest break in rollator seat w/ verbal cues for brakes management. Ambulated 75' back to room and EOB w/ CGA. SpO2 >94% on RA and HR 106 bpm. Increased time for all mobility 2/2 generalized fatigue and impaired endurance. Instructed pt in results of PT evaluation as detailed below, PT POC, rehab potential, rehab goals, and discharge recommendations. Discussed his personal goals for mobility in light of multiple medical diagnoses including stage IV lung cancer. Pt reports he wants to be as little burden on his wife as possible and to improve his SOB to make mobility  more tolerable for him.  Discussed the benefits of meeting w/ a palliative care clinician to establish more concrete goals of care. Pt is very open to this - made PA aware. Pt ended session in supine, all needs in reach.   PT Evaluation Precautions/Restrictions Precautions Precautions: Fall Precaution Comments: ostomy, foley Restrictions Weight Bearing Restrictions: No Other Position/Activity Restrictions: Pt w/ L hemiarthoplasty 02/18/20, posterior hip precautions General   Vital SignsTherapy Vitals Temp: 98.3 F (36.8 C) Pulse Rate: 73 Resp: 18 BP: (!) 153/70 Patient Position (if appropriate): Sitting Oxygen Therapy SpO2: 95 % O2 Device: Room Air Home Living/Prior Functioning Home Living Available Help at Discharge: Family;Available 24 hours/day Type of Home: Other(Comment) Home Access: Elevator Home Layout: One level  Lives With: Spouse Prior Function Level of Independence: Requires assistive device for independence  Able to Take Stairs?: Yes(limited) Driving: No Vocation: Retired Comments: Was ambulatory using rollator and performing ADLs w/ distant supervision from wife up until Jan 2021 when he got COVID. He has been declining functionally since then w/ multiple medical issues, hospital stays, and falls. Vision/Perception  Perception Perception: Within Functional Limits Praxis Praxis: Intact  Cognition Overall Cognitive Status: Impaired/Different from baseline(Pt admits to cognitive decline over last few months, he reports impaired memory, will continue to assess. Prior to a few months ago, he would perform crosswords, play computer games, etc.) Arousal/Alertness: Awake/alert Orientation Level: Oriented X4 Safety/Judgment: Appears intact Sensation Sensation Light Touch: Impaired by gross assessment(reports tingling distally bilaterally, states this has been baseline since onset of chemo) Proprioception: Appears Intact Coordination Gross Motor Movements are Fluid and Coordinated:  Yes Motor  Motor Motor: Within Functional Limits Motor - Skilled Clinical Observations: generalized weakness and deconditioning  Mobility Bed Mobility Bed Mobility: Rolling Right;Rolling Left;Sit to Supine;Supine to Sit Rolling Right: Supervision/verbal cueing Rolling Left: Supervision/Verbal cueing Supine to Sit: Supervision/Verbal cueing Sit to Supine: Supervision/Verbal cueing Transfers Transfers: Sit to Stand;Stand to Sit;Stand Pivot Transfers Sit to Stand: Contact Guard/Touching assist Stand to Sit: Contact Guard/Touching assist Stand Pivot Transfers: Contact Guard/Touching assist Transfer (Assistive device): Rollator Locomotion  Gait Ambulation: Yes Gait Assistance: Contact Guard/Touching assist Gait Distance (Feet): 75 Feet Assistive device: 4-wheeled walker Gait Gait: Yes Gait Pattern: Impaired Gait Pattern: Shuffle;Narrow base of support;Trunk flexed Gait velocity: decreased Stairs / Additional Locomotion Stairs: No Wheelchair Mobility Wheelchair Mobility: No  Trunk/Postural Assessment  Cervical Assessment Cervical Assessment: Exceptions to WFL(forward head/rounded shoulders) Thoracic Assessment Thoracic Assessment: Exceptions to WFL(kyphosis) Lumbar Assessment Lumbar Assessment: Exceptions to WFL(posterior pelvic tilt) Postural Control Postural Control: Within Functional Limits  Balance Balance Balance Assessed: Yes Static Sitting Balance Static Sitting - Level of Assistance: 5: Stand by assistance Dynamic Sitting Balance Dynamic Sitting - Level of Assistance: 5: Stand by assistance Static Standing Balance Static Standing - Level of Assistance: 5: Stand by assistance Dynamic Standing Balance Dynamic Standing - Level of Assistance: 4: Min assist Extremity Assessment  RLE Assessment RLE Assessment: Exceptions to [facility] Passive Range of Motion (PROM) Comments: WFL General Strength Comments: Globally 4 to 4+/5  LLE Assessment LLE Assessment: Exceptions to  [facility] Passive Range of Motion (PROM) Comments: Posterior hip precautions s/p L hemiarthoplasty in hip General Strength Comments: Globally 4 to 4+/5    Refer to Care Plan for Long Term Goals  Recommendations for other services: Neuropsych  Discharge Criteria: Patient will be discharged from PT if patient refuses treatment 3 consecutive times without medical reason, if treatment goals not met, if there is a change in medical status, if patient makes no progress  towards goals or if patient is discharged from hospital.  The above assessment, treatment plan, treatment alternatives and goals were discussed and mutually agreed upon: by patient  Arriyana Rodell Clent Demark 03/26/2020, 9:57 AM

## 2020-03-26 NOTE — Evaluation (Signed)
Occupational Therapy Assessment and Plan  Patient Details  Name: Anthony Curry. MRN: [id number] Date of Birth: 1941-02-27  OT Diagnosis: abnormal posture, cognitive deficits and muscle weakness (generalized) Rehab Potential:   ELOS: 5-7   Today's Date: 03/26/2020 OT Individual Time: 1000-1055 OT Individual Time Calculation (min): 55 min     Problem List:  Patient Active Problem List   Diagnosis Date Noted  . Debility 03/25/2020  . Pneumonitis   . Acute gout of left knee   . Candida UTI   . Supplemental oxygen dependent   . Pressure injury of skin 03/16/2020  . Hyponatremia 03/13/2020  . Fall (on)(from) sidewalk curb, initial encounter 03/13/2020  . Multiple fractures of rib involving four or more ribs 03/13/2020  . Diverticulitis 03/13/2020  . Hypothyroidism (acquired) 03/13/2020  . Stage 4 lung cancer, unspecified laterality ([place])   . Pericardial effusion   . Atrial fibrillation, chronic ([place])   . Left displaced femoral neck fracture ([place]) 02/17/2020  . Pericardial effusion 02/17/2020  . Thromboembolism ([place]) 02/17/2020  . Hip fracture ([place]) 02/17/2020  . Diverticulitis 01/02/2020  . Acute diverticulitis 01/02/2020  . Ataxia 06/30/2019  . SIADH (syndrome of inappropriate ADH production) ([place]) 06/30/2019  . Generalized weakness 06/28/2019  . Lactic acidosis 06/28/2019  . Abnormal urinalysis 06/28/2019  . Hypertension 12/07/2016  . Chemotherapy-induced neuropathy ([place]) 10/15/2015  . Acute urinary retention 09/09/2015  . Choledocholithiasis with cholecystitis 09/08/2015  . Common bile duct stone   . Encounter for antineoplastic immunotherapy 06/28/2015  . Atrial fibrillation ([place]) 06/11/2015  . Gynecomastia, male 06/10/2015  . Dizziness 05/11/2015  . Malignant neoplasm of lower lobe of right lung ([place]) 09/17/2014  . Neutropenic fever ([place]) 09/02/2014  . Neoplastic malignant related fatigue 08/13/2014  . Physical deconditioning 08/13/2014  . Hypoalbuminemia  08/13/2014  . Diarrhea 08/13/2014  . Antineoplastic chemotherapy induced anemia(285.3) 08/13/2014  . Atrial fibrillation with RVR ([place]) 07/28/2014  . HCAP (healthcare-associated pneumonia) 07/25/2014  . Alcoholism in remission ([place]) 07/25/2014  . Atrial flutter by electrocardiogram ([place]) 07/25/2014  . Swelling of limb 06/10/2014  . Lung cancer ([place]) 10/15/2013  . Hypotension 09/10/2013  . Adrenal mass ([place]) 09/09/2013  . 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 Pulmonary nodules 08/19/2013  . Anemia of chronic disease 07/03/2013  . BPH (benign prostatic hyperplasia) 07/03/2013  . GERD (gastroesophageal reflux disease) 07/03/2013  . Fall 07/03/2013  . Alcohol intoxication ([place]) 07/03/2013  . 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 Alcohol abuse 10/15/2011  . Hyponatremia 10/15/2011    Past Medical History:  Past Medical History:  Diagnosis Date  . 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 Adrenal mass ([place])   . Alcoholism in remission ([place])   . Anemia of chronic disease   . Ankle fracture    right  . 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 Anxiety   . Atrial fibrillation, chronic ([place])   . BPH (benign prostatic hyperplasia)   . Cancer ([place])    small cell/lung  . Complication of anesthesia 09/08/2015    JITTERY AFTER GALLBLADDER SURGERY  . COVID-19 12/2019  . Depression   . Epididymoorchitis 01/30/2020   with right hydrocele  . Femur fracture ([place])   . GERD (gastroesophageal reflux disease)   . History of radiation therapy 11/14/16, 11/18/16, 11/22/16   Left upper lung: 54 Gy in 3 fractions  . Hypertension   . Hyponatremia   . Multiple falls   . Neoplastic malignant related fatigue    IV tx. every 2 weeks last9-14-16 (Dr. Earlie Server),  radiation last tx. 6 months  . 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 Perforated diverticulum   . Pericardial effusion   . Persistent atrial fibrillation ([place])   . Protein calorie malnutrition ([place])   . Pulmonary  nodules   . Radiation 12/02/14-12/16/14   Left adrenal gland 30 Gy in 10 fractions  . Stage 4 lung cancer, unspecified laterality ([place])   . Urinary retention    Past Surgical History:  Past Surgical History:  Procedure Laterality Date  . ANKLE ARTHRODESIS  1995   rt fx-hardware in  . CARDIOVERSION N/A 09/29/2014   Procedure: CARDIOVERSION;  Surgeon: Josue Hector, MD;  Location: [facility] ENDOSCOPY;  Service: Cardiovascular;  Laterality: N/A;  . CHOLECYSTECTOMY  09/08/2015    laproscopic   . CHOLECYSTECTOMY N/A 09/08/2015   Procedure: LAPAROSCOPIC CHOLECYSTECTOMY WITH ATTEMPTED INTRAOPERATIVE CHOLANGIOGRAM ;  Surgeon: Fanny Skates, MD;  Location: [facility];  Service: General;  Laterality: N/A;  . COLON SURGERY     sigmoid colectomy with colostomy for ruptured diverticulum  . COLONOSCOPY    . EMBOLECTOMY Left 01/06/2020   Procedure: Embolectomy left axillary, brachial, radial, and ulnar artery;  Surgeon: Elam Dutch, MD;  Location: [facility] OR;  Service: Vascular;  Laterality: Left;  . ERCP N/A 09/02/2015   Procedure: ENDOSCOPIC RETROGRADE CHOLANGIOPANCREATOGRAPHY (ERCP);  Surgeon: Arta Silence, MD;  Location: [facility] ENDOSCOPY;  Service: Endoscopy;  Laterality: N/A;  . ERCP N/A 09/07/2015   Procedure: ENDOSCOPIC RETROGRADE CHOLANGIOPANCREATOGRAPHY (ERCP);  Surgeon: Clarene Essex, MD;  Location: [facility] ENDOSCOPY;  Service: Endoscopy;  Laterality: N/A;  . EXCISION / CURETTAGE BONE CYST FINGER  2005   rt thumb  . hemiarthroplasty Left 02/2020  . HEMORRHOID SURGERY    . HIP ARTHROPLASTY Left 02/18/2020   Procedure: LEFT HIP HEMIARTHROPLASTY;  Surgeon: Marchia Bond, MD;  Location: [facility] ORS;  Service: Orthopedics;  Laterality: Left;  . LAPAROTOMY N/A 01/05/2020   Procedure: EXPLORATORY LAPAROTOMY sigmoid colon resection creation end colostomy for perforated diverticulitis and purulent peritonitis;  Surgeon: Kieth Brightly Arta Bruce, MD;  Location: [facility] ORS;  Service: General;  Laterality: N/A;  . Percutaneous  Cholecytostomy tube     IVR insertion 06-13-15(Dr. Vernard Gambles)- 08-28-15 remains in place to drainage bag  . TONSILLECTOMY    . TOTAL SHOULDER ARTHROPLASTY  02/03/2012   Procedure: TOTAL SHOULDER ARTHROPLASTY;  Surgeon: [name], MD;  Location: [facility];  Service: Orthopedics;  Laterality: Left;    Assessment & Plan Clinical Impression: 79 yo male with PMH including afib, pericardial effusion, HTN, hyponatremia, stage IV SCC s/p radiation, remote adrenal cancer, ETOH dependence presents to ED with fall from w/c without LOC, medical diagnoses of symptomatic hyponatremia and posterior R 4-8 rib fractures.Pt with recent hospitalization in 12/2019 for perforated diverticulitis s/p sigmoid colectomy with colostomy, development of ischemic L hand s/p L brachial embolectomy and covid 19 PNA. Again hospitalized in 02/2020 due to hip fracture.  Patient currently requires min with basic self-care skills secondary to muscle weakness, decreased cardiorespiratoy endurance, decreased memory and decreased sitting balance, decreased standing balance, decreased postural control and decreased balance strategies.  Prior to hospitalization, patient could complete BADL with independent .  Patient will benefit from skilled intervention to decrease level of assist with basic self-care skills and increase independence with basic self-care skills prior to discharge home with care partner.  Anticipate patient will require 24 hour supervision and follow up home health.  OT - End of Session Endurance Deficit: Yes Endurance Deficit Description: Decreased, SOB and elevated HR w/  all mobility. Pt on RA, spO2 >93%, HR up to 117 bpm w/ ambulation. OT Assessment Rehab Potential (ACUTE ONLY): Good OT Patient demonstrates impairments in the following area(s): Balance;Cognition;Endurance;Motor;Pain;Safety;Sensory;Skin Integrity OT Basic ADL's Functional Problem(s): Grooming;Bathing;Dressing;Toileting OT Transfers  Functional Problem(s): Toilet;Tub/Shower OT Plan OT Intensity: Minimum of 1-2 x/day, 45 to 90 minutes OT Frequency: 5 out of 7 days OT Duration/Estimated Length of Stay: 5-7 OT Treatment/Interventions: Balance/vestibular training;DME/adaptive equipment instruction;Patient/family education;Therapeutic Activities;Wheelchair propulsion/positioning;Cognitive remediation/compensation;Psychosocial support;Therapeutic Exercise;UE/LE Strength taining/ROM;Self Care/advanced ADL retraining;Functional mobility training;Community reintegration;Discharge planning;Neuromuscular re-education;Skin care/wound managment;UE/LE Coordination activities;Visual/perceptual remediation/compensation;Splinting/orthotics;Pain management;Disease mangement/prevention OT Self Feeding Anticipated Outcome(s): MOD I OT Basic Self-Care Anticipated Outcome(s): S OT Toileting Anticipated Outcome(s): S OT Bathroom Transfers Anticipated Outcome(s): S OT Recommendation Patient destination: Home Follow Up Recommendations: Home health OT Equipment Recommended: 3 in 1 bedside comode;To be determined   Skilled Therapeutic Intervention 1:1. Pt received in bed agreeable to OT. Pt requesting extensive education on OT role/purpose, CIR, ELOS, palliative care, showering procedures, and skilled approaches to improving function. Pt declines bathing and dressing this date as he already has clothing on from PT. Pt willing to shower tomorrow and OT discusses procedures to change ostomy bag or cover up for shower. Pt completes ambulation to sink with rolator and CGA  with VC for brake managmeent and turning to sit for energy conservations. Pt grooms with set up at sink and returns to EOB in same manner. Pt endorses memory issues and feels frustrated in personal assessment in decline in cognition. Pt agreeable to doing MOCA, however MD enters room and pt requests to complete later d/t questions for MD. Exited session wit pt seate din bed, exit alarm on  and call light in reach  OT Evaluation Precautions/Restrictions  Precautions Precautions: Fall Precaution Comments: ostomy, foley Restrictions Weight Bearing Restrictions: No Other Position/Activity Restrictions: Pt w/ L hemiarthoplasty 02/18/20, posterior hip precautions General   Vital Signs Therapy Vitals Temp: 98.3 F (36.8 C) Pulse Rate: 73 Resp: 18 BP: (!) 153/70 Patient Position (if appropriate): Sitting Oxygen Therapy SpO2: 95 % O2 Device: Room Air Pain   Home Living/Prior Functioning Home Living Family/patient expects to be discharged to:: Private residence Living Arrangements: Spouse/significant other Available Help at Discharge: Family, Available 24 hours/day Type of Home: Other(Comment) Home Access: Elevator Home Layout: One level  Lives With: Spouse Prior Function Level of Independence: Requires assistive device for independence  Able to Take Stairs?: Yes(limited) Driving: No Vocation: Retired Comments: Was ambulatory using rollator and performing ADLs w/ distant supervision from wife up until Jan 2021 when he got COVID. He has been declining functionally since then w/ multiple medical issues, hospital stays, and falls. ADL   Vision Baseline Vision/History: Wears glasses Wears Glasses: Reading only Patient Visual Report: No change from baseline Perception  Perception: Within Functional Limits Praxis Praxis: Intact Cognition Overall Cognitive Status: Impaired/Different from baseline Arousal/Alertness: Awake/alert Orientation Level: Person;Place;Situation Person: Oriented Place: Oriented Situation: Oriented Memory: Impaired Memory Impairment: Decreased short term memory Immediate Memory Recall: Sock;Blue;Bed Memory Recall Sock: With Cue Memory Recall Blue: Without Cue Memory Recall Bed: Without Cue Safety/Judgment: Appears intact Sensation Sensation Light Touch: Impaired by gross assessment(reports tingling distally bilaterally, states this  has been baseline since onset of chemo) Proprioception: Appears Intact Coordination Gross Motor Movements are Fluid and Coordinated: Yes Motor  Motor Motor: Within Functional Limits Motor - Skilled Clinical Observations: generalized weakness and deconditioning Mobility  Bed Mobility Bed Mobility: Rolling Right;Rolling Left;Sit to Supine;Supine to Sit Rolling Right: Supervision/verbal cueing Rolling Left: Supervision/Verbal cueing Supine to  Sit: Supervision/Verbal cueing Sit to Supine: Supervision/Verbal cueing Transfers Sit to Stand: Contact Guard/Touching assist Stand to Sit: Contact Guard/Touching assist  Trunk/Postural Assessment  Cervical Assessment Cervical Assessment: Exceptions to WFL(forward head/rounded shoulders) Thoracic Assessment Thoracic Assessment: Exceptions to WFL(kyphosis) Lumbar Assessment Lumbar Assessment: Exceptions to WFL(posterior pelvic tilt) Postural Control Postural Control: Within Functional Limits  Balance Balance Balance Assessed: Yes Static Sitting Balance Static Sitting - Level of Assistance: 5: Stand by assistance Dynamic Sitting Balance Dynamic Sitting - Level of Assistance: 5: Stand by assistance Static Standing Balance Static Standing - Level of Assistance: 5: Stand by assistance Dynamic Standing Balance Dynamic Standing - Level of Assistance: 4: Min assist Extremity/Trunk Assessment RUE Assessment RUE Assessment: Exceptions to [facility] General Strength Comments: decreased shoulder ROM flexiion and abduction LUE Assessment LUE Assessment: Exceptions to [facility] General Strength Comments: decreased shoulder flexion and abduction. ROM > RUE     Refer to Care Plan for Long Term Goals  Recommendations for other services: Neuropsych and Other: palliative care   Discharge Criteria: Patient will be discharged from OT if patient refuses treatment 3 consecutive times without medical reason, if treatment goals not met, if there is a change in  medical status, if patient makes no progress towards goals or if patient is discharged from hospital.  The above assessment, treatment plan, treatment alternatives and goals were discussed and mutually agreed upon: by patient  [name] 03/26/2020, 10:04 AM
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
## 2020-03-26 NOTE — Progress Notes (Signed)
[facility] PHYSICAL MEDICINE & REHABILITATION PROGRESS NOTE   Subjective/Complaints: Anthony Curry feels unable to tolerate therapy this morning due to poor sleep last night. Said he was receiving Ambien on other floor which helped but this was not recommended at his age by overnight RN and he wants to know why.  Also complains of poor appetite.  ROS: +poor appetite, insomnia, cough, SOB  Objective:   No results found. Recent Labs    03/26/20 0709  WBC 10.4  HGB 13.3  HCT 39.5  PLT 469*   Recent Labs    03/25/20 0314 03/26/20 0709  NA 131* 133*  K 3.7 3.3*  CL 95* 95*  CO2 27 28  GLUCOSE 101* 95  BUN 19 19  CREATININE 0.55* 0.66  CALCIUM 8.8* 9.2    Intake/Output Summary (Last 24 hours) at 03/26/2020 1057 Last data filed at 03/26/2020 0912 Gross per 24 hour  Intake 776 ml  Output 1275 ml  Net -499 ml     Physical Exam: Vital Signs Blood pressure (!) 153/70, pulse 73, temperature 98.3 F (36.8 C), resp. rate 18, SpO2 95 %. Nursing note and vitals reviewed. Constitutional: He appears well-developed. He appears cachectic.  Frail  Eyes: EOM are normal. Right eye exhibits no discharge. Left eye exhibits no discharge.  Neck: No tracheal deviation present. No thyromegaly present.  Respiratory: Effort normal. No stridor. No respiratory distress.  +[place]  GI: He exhibits no distension. There is no abdominal tenderness.  Midline incision C/D/I and healing well. Colostomy in place with soft stool.   Musculoskeletal:     Comments: No edema or tenderness in extremities  Neurological: He is alert.  Motor: B/l UE: 4+/5 throughout RLE: 4-4+/5 proximally, 5/5 ADF LLE: HF, KE 4/5, ADF 5/5  Skin: Skin is warm and dry.  Psychiatric: Frustrated/agitated about current issues.    Assessment/Plan: 1. Functional deficits secondary to debility which require 3+ hours per day of interdisciplinary therapy in a comprehensive inpatient rehab setting.  Physiatrist is providing close team  supervision and 24 hour management of active medical problems listed below.  Physiatrist and rehab team continue to assess barriers to discharge/monitor patient progress toward functional and medical goals  Care Tool:  Bathing              Bathing assist       Upper Body Dressing/Undressing Upper body dressing   What is the patient wearing?: Button up shirt    Upper body assist Assist Level: Maximal Assistance - Patient 25 - 49%    Lower Body Dressing/Undressing Lower body dressing            Lower body assist       Toileting Toileting    Toileting assist       Transfers Chair/bed transfer  Transfers assist     Chair/bed transfer assist level: Contact Guard/Touching assist     Locomotion Ambulation   Ambulation assist      Assist level: Contact Guard/Touching assist Assistive device: Rollator Max distance: 75'   Walk 10 feet activity   Assist     Assist level: Contact Guard/Touching assist Assistive device: Rollator   Walk 50 feet activity   Assist    Assist level: Contact Guard/Touching assist Assistive device: Rollator    Walk 150 feet activity   Assist Walk 150 feet activity did not occur: Safety/medical concerns         Walk 10 feet on uneven surface  activity   Assist Walk 10 feet  on uneven surfaces activity did not occur: Safety/medical Armed forces technical officer activity did not occur: N/A         Wheelchair 50 feet with 2 turns activity    Assist    Wheelchair 50 feet with 2 turns activity did not occur: N/A       Wheelchair 150 feet activity     Assist  Wheelchair 150 feet activity did not occur: N/A       Blood pressure (!) 153/70, pulse 73, temperature 98.3 F (36.8 C), resp. rate 18, SpO2 95 %.    Medical Problem List and Plan: 1. Shuffling gait with poor safety awareness, poor endurance, weakness, DOE secondary to debility.              -patient may shower             -ELOS/Goals: 5-9 days/Supervision/Mod I             -Initial CIR therapies today 2.  Antithrombotics: -DVT/anticoagulation:  Pharmaceutical: Other (comment)--Eliquis             -antiplatelet therapy: N/A  3. Rib fracturs/back pain/Pain Management: Vicodin prn for pain.              Monitor with increased exertion.  4. Mood: LCSW to follow for evaluation and support.               -antipsychotic agents: N/A  -Depressed mood: would be interested in palliative care consult. Remeron will also help 5. Neuropsych: This patient is capable of making decisions on his own behalf.  Will obtain neuropsych consult 6. Skin/Wound Care: Routine pressure relief measures. Added ensure and boost for supplement as appetite is poor but wants supplements 7. Fluids/Electrolytes/Nutrition: Strict I/Os. Patient has poor appetite. Asked that his therapy start at 9 so he can eat a later breakfast at 8 which he is more used to at home and when he feels he will have a better appetite.  8. Hypoxia/DOE/Pnemonitis: Multifactorial due to Covid PNA, lung cancer, pericardial effusion and pneumonitis. Completed course of antibiotics 4/20. Continue Mucinex DM bid. Slow weekly prednisone taper recommended--will start taper as gout improving also. Humidification added and patient encouraged to ask for nebs as needed.   4/22: will obtain CXR given persistent SOB and cough to ensure no worsening of opacity or effusion  9. Stage IV Lung cancer s/p XRT: Chemo on hold: To follow up on outpatient basis.  10. Hyponatremia: Has been in 129-130 range. Asymptomatic. Continue demeclocycline bid             4/22: Na trending upward to 133. May liberalize fluid restriction to 1800. Repeat CMP tomorrow.  11. Candida UTI: On diflucan Day# 6/7 (See #15) 12. Left hip hemi: THR, precautions for 6 weeks. No squats or twisting indefinitely.  13. Gout flare left knee: On colchicine with improvement of symptoms.  14.  Insomnia: 4/22: restarted PRN low dose remeron (patient would prefer prn and low dose) given improvement in Na. Educated regarding side effects of Remeron. Confirmed that Ambien is note advised in elderly due to fall risk and it has greater risk of dependence, which patient is afraid of developing.  15. Dysphonia: may be secondary to esophageal thrush: extend fluconazole 1 more week. 16. Hypokalemia: K+ low this morning; start [place]. Repeat CMP tomorrow.  17. Polypharmacy: Patient felt overwhelmed by amount of medications he is on.  Discussed the purpose of each medication with patient and he appreciated review.    >35 minutes spent in face to face time and education of patient  LOS: 1 days A FACE TO FACE EVALUATION WAS [place] 03/26/2020, 10:57 AM

## 2020-03-26 NOTE — Care Management (Signed)
[facility] Individual Statement of Services  Patient Name:  Anthony Curry.  Date:  03/26/2020  Welcome to the [facility].  Our goal is to provide you with an individualized program based on your diagnosis and situation, designed to meet your specific needs.  With this comprehensive rehabilitation program, you will be expected to participate in at least 3 hours of rehabilitation therapies Monday-Friday, with modified therapy programming on the weekends.  Your rehabilitation program will include the following services:  Physical Therapy (PT), Occupational Therapy (OT), 24 hour per day rehabilitation nursing, Therapeutic Recreaction (TR), Psychology, Neuropsychology, Case Management (Social Worker), Rehabilitation Medicine, Nutrition Services, Pharmacy Services and Other  Weekly team conferences will be held on Tuesdays to discuss your progress.  Your Social Worker will talk with you frequently to get your input and to update you on team discussions.  Team conferences with you and your family in attendance may also be held.  Expected length of stay: 5-7 days   Overall anticipated outcome: Supervision  Depending on your progress and recovery, your program may change. Your Social Worker will coordinate services and will keep you informed of any changes. Your Social Worker's name and contact numbers are listed  below.  The following services may also be recommended but are not provided by the [facility] will be made to provide these services after discharge if needed.  Arrangements include referral to agencies that provide these services.  Your insurance has been verified to be:  Medicare A/B  Your primary doctor is:  No PCP  Pertinent information will be shared with your doctor and your  insurance company.  Social Worker:  Loralee Pacas, [place] [phone]  Information discussed with and copy given to patient by: Rana Snare, 03/26/2020, 4:38 PM

## 2020-03-26 NOTE — Progress Notes (Signed)
Restless at interval throughout shift,generalized weakness and agitation noted, interval of non-productive cough but refuses prn medication states Im ok, no acute respiratory distress observed repositioned with assistance and schedule with prn provided, Foley and colostomy care provided. Monitor and assisted, emotional support provided, reassurance on 1500 cc/fluid restriction and educated on restrictions limitations, states he understand. Continue to monitor and asist.

## 2020-03-26 NOTE — Progress Notes (Signed)
Social Work Assessment and Plan   Patient Details  Name: Anthony Curry. MRN: [id number] Date of Birth: 19-Feb-1941  Today's Date: 03/26/2020  Problem List:  Patient Active Problem List   Diagnosis Date Noted  . Debility 03/25/2020  . Pneumonitis   . Acute gout of left knee   . Candida UTI   . Supplemental oxygen dependent   . Pressure injury of skin 03/16/2020  . Hyponatremia 03/13/2020  . Fall (on)(from) sidewalk curb, initial encounter 03/13/2020  . Multiple fractures of rib involving four or more ribs 03/13/2020  . Diverticulitis 03/13/2020  . Hypothyroidism (acquired) 03/13/2020  . Stage 4 lung cancer, unspecified laterality ([place])   . Pericardial effusion   . Atrial fibrillation, chronic ([place])   . Left displaced femoral neck fracture ([place]) 02/17/2020  . Pericardial effusion 02/17/2020  . Thromboembolism ([place]) 02/17/2020  . Hip fracture ([place]) 02/17/2020  . Diverticulitis 01/02/2020  . Acute diverticulitis 01/02/2020  . Ataxia 06/30/2019  . 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 Generalized weakness 06/28/2019  . Lactic acidosis 06/28/2019  . Abnormal urinalysis 06/28/2019  . Hypertension 12/07/2016  . Chemotherapy-induced neuropathy ([place]) 10/15/2015  . Acute urinary retention 09/09/2015  . Choledocholithiasis with cholecystitis 09/08/2015  . Common bile duct stone   . Encounter for antineoplastic immunotherapy 06/28/2015  . Atrial fibrillation ([place]) 06/11/2015  . Gynecomastia, male 06/10/2015  . Dizziness 05/11/2015  . Malignant neoplasm of lower lobe of right lung ([place]) 09/17/2014  . Neutropenic fever ([place]) 09/02/2014  . Neoplastic malignant related fatigue 08/13/2014  . Physical deconditioning 08/13/2014  . Hypoalbuminemia 08/13/2014  . Diarrhea 08/13/2014  . Antineoplastic chemotherapy induced anemia(285.3) 08/13/2014  . Atrial fibrillation with RVR ([place]) 07/28/2014  . HCAP (healthcare-associated pneumonia) 07/25/2014  .  Alcoholism in remission ([place]) 07/25/2014  . Atrial flutter by electrocardiogram ([place]) 07/25/2014  . Swelling of limb 06/10/2014  . Lung cancer ([place]) 10/15/2013  . Hypotension 09/10/2013  . Adrenal mass ([place]) 09/09/2013  . 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 Pulmonary nodules 08/19/2013  . Anemia of chronic disease 07/03/2013  . BPH (benign prostatic hyperplasia) 07/03/2013  . GERD (gastroesophageal reflux disease) 07/03/2013  . Fall 07/03/2013  . Alcohol intoxication ([place]) 07/03/2013  . 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 Alcohol abuse 10/15/2011  . Hyponatremia 10/15/2011   Past Medical History:  Past Medical History:  Diagnosis Date  . 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 Adrenal mass ([place])   . Alcoholism in remission ([place])   . Anemia of chronic disease   . Ankle fracture    right  . 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 Anxiety   . Atrial fibrillation, chronic ([place])   . BPH (benign prostatic hyperplasia)   . Cancer ([place])    small cell/lung  . Complication of anesthesia 09/08/2015    JITTERY AFTER GALLBLADDER SURGERY  . COVID-19 12/2019  . Depression   . Epididymoorchitis 01/30/2020   with right hydrocele  . Femur fracture ([place])   . GERD (gastroesophageal reflux disease)   . History of radiation therapy 11/14/16, 11/18/16, 11/22/16   Left upper lung: 54 Gy in 3 fractions  . Hypertension   . Hyponatremia   . Multiple falls   . Neoplastic malignant related fatigue    IV tx. every 2 weeks last9-14-16 (Dr. Earlie Server), radiation last tx. 6 months  . 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 Perforated diverticulum   . Pericardial effusion   . Persistent atrial fibrillation ([place])   . Protein  calorie malnutrition ([place])   . Pulmonary nodules   . Radiation 12/02/14-12/16/14   Left adrenal gland 30 Gy in 10 fractions  . Stage 4 lung cancer, unspecified laterality ([place])   . Urinary retention    Past Surgical History:  Past Surgical History:   Procedure Laterality Date  . ANKLE ARTHRODESIS  1995   rt fx-hardware in  . CARDIOVERSION N/A 09/29/2014   Procedure: CARDIOVERSION;  Surgeon: Josue Hector, MD;  Location: [facility] ENDOSCOPY;  Service: Cardiovascular;  Laterality: N/A;  . CHOLECYSTECTOMY  09/08/2015    laproscopic   . CHOLECYSTECTOMY N/A 09/08/2015   Procedure: LAPAROSCOPIC CHOLECYSTECTOMY WITH ATTEMPTED INTRAOPERATIVE CHOLANGIOGRAM ;  Surgeon: Fanny Skates, MD;  Location: [facility];  Service: General;  Laterality: N/A;  . COLON SURGERY     sigmoid colectomy with colostomy for ruptured diverticulum  . COLONOSCOPY    . EMBOLECTOMY Left 01/06/2020   Procedure: Embolectomy left axillary, brachial, radial, and ulnar artery;  Surgeon: Elam Dutch, MD;  [facility];  Service: Vascular;  Laterality: Left;  . ERCP N/A 09/02/2015   Procedure: ENDOSCOPIC RETROGRADE CHOLANGIOPANCREATOGRAPHY (ERCP);  Surgeon: Arta Silence, MD;  Location: [facility] ENDOSCOPY;  Service: Endoscopy;  Laterality: N/A;  . ERCP N/A 09/07/2015   Procedure: ENDOSCOPIC RETROGRADE CHOLANGIOPANCREATOGRAPHY (ERCP);  Surgeon: Clarene Essex, MD;  Location: [facility] ENDOSCOPY;  Service: Endoscopy;  Laterality: N/A;  . EXCISION / CURETTAGE BONE CYST FINGER  2005   rt thumb  . hemiarthroplasty Left 02/2020  . HEMORRHOID SURGERY    . HIP ARTHROPLASTY Left 02/18/2020   Procedure: LEFT HIP HEMIARTHROPLASTY;  Surgeon: Marchia Bond, MD;  Location: [facility] ORS;  Service: Orthopedics;  Laterality: Left;  . LAPAROTOMY N/A 01/05/2020   Procedure: EXPLORATORY LAPAROTOMY sigmoid colon resection creation end colostomy for perforated diverticulitis and purulent peritonitis;  Surgeon: Kieth Brightly Arta Bruce, MD;  Location: [facility] ORS;  Service: General;  Laterality: N/A;  . Percutaneous Cholecytostomy tube     IVR insertion 06-13-15(Dr. Vernard Gambles)- 08-28-15 remains in place to drainage bag  . TONSILLECTOMY    . TOTAL SHOULDER ARTHROPLASTY  02/03/2012   Procedure: TOTAL SHOULDER ARTHROPLASTY;  Surgeon: [name], MD;  Location: [facility];  Service: Orthopedics;  Laterality: Left;   Social History:  reports that he quit smoking about 29 years ago. His smoking use included cigarettes. He has a 30.00 pack-year smoking history. He has never used smokeless tobacco. He reports previous alcohol use. He reports that he does not use drugs.  Family / Support Systems Marital Status: Married How Long?: 46 years Patient Roles: Spouse, Parent Spouse/Significant Other: Anthony Curry (wife): [phone] Children: 3 adult children; 2 living children: [name]/dtr [phone])/currenlty visiting from [facility], will be here for one week; 1 adult son- lives in [place]. Other Supports: N/A Anticipated Caregiver: Wife Ability/Limitations of Caregiver: some physical limitations Caregiver Availability: 24/7 Family Dynamics: Pt and wife live together  Social History Preferred language: English Religion: Catholic Cultural Background: Pt worked at [employer] for 35 years. Pt retired 15 yrs ago Education: some college Read: Yes Write: Yes Employment Status: Retired Date Retired/Disabled/Unemployed: 7 years ago Public relations account executive Issues: Denies Guardian/Conservator: N/A   Abuse/Neglect Abuse/Neglect Assessment Can Be Completed: Yes Physical Abuse: Denies Verbal Abuse: Denies Sexual Abuse: Denies Exploitation of patient/patient's resources: Denies Self-Neglect: Denies  Emotional Status Pt's affect, behavior and adjustment status: Pt admits that he is "depressed" about his current condition Recent Psychosocial Issues: Pt admits to situtional depression with all his new health changes Psychiatric History: Denies Substance Abuse  History: Denies current use; admits he quit smoking 30 yrs ago as he did not like the way it made him feel; states he was abusing EtOH but quit 2 years ago. Denies recreational drug use  Patient / Family Perceptions, Expectations & Goals Pt/Family understanding of illness &  functional limitations: Pt and wife have a general understanding of pt medical condition Premorbid pt/family roles/activities: Mod I; supervision with showers to make sure he would not fall Anticipated changes in roles/activities/participation: Assistance with ADLs/IADLs Pt/family expectations/goals: Pt goal is to "be independent as he was before COVID, and build up strength."  [employer]: None Premorbid Home Care/DME Agencies: Other (Comment)(Active with [facility]- will need resumption orders) Transportation available at discharge: Wife to transport Resource referrals recommended: Neuropsychology  Discharge Planning Living Arrangements: Spouse/significant other Support Systems: Spouse/significant other Type of Residence: Private residence Insurance Resources: [employer], Multimedia programmer (specify)(BCBS Supp) Museum/gallery curator Resources: SSI, Other (Comment) Financial Screen Referred: No Living Expenses: Own Money Management: Patient Does the patient have any problems obtaining your medications?: No Home Management: Pt managed finances; able to manage own meds Patient/Family Preliminary Plans: Wife will begin to learn how to manage finances [place] Barriers to Discharge: Decreased caregiver support, Lack of/limited family support Social Work Anticipated Follow Up Needs: HH/OP  Clinical Impression SW met with pt and pt wife in room to introduce self, explain role, and discuss discharge process. 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 Goes to [place] once per year; [facility] from [place] on [address]. DME: RW and canes. Pt does not have HCPOA. Pt would like to complete Advanced Care Directive. Pt admits to depression and would like to speak with someone. Pt declined to speak with chaplain services. Pt states not aware of his service connection. [facility] to discuss [facility].    Rana Snare 03/26/2020, 4:47 PM

## 2020-03-26 NOTE — Progress Notes (Signed)
Occupational Therapy Session Note  Patient Details  Name: Anthony Curry. MRN: [id number] Date of Birth: 06-27-1941  Today's Date: 03/26/2020 OT Individual Time: 1300-1415 OT Individual Time Calculation (min): 75 min   Skilled Therapeutic Interventions Patient met lying supine in bed in agreement with OT treatment session with focus on functional transfers, functional mobility, therapeutic exercise and cognition as detailed below. Patient ambulated from room to ADL kitchen with use of rollator and 2 seated rest breaks secondary to SOB and fatigue. Patient notes that he becomes winded very easily since being diagnosed with COVID-19 in January. Patient also notes decline in memory making IADL tasks including checking the mail and managing finances a hardship. Cognitive assessment performed with patient demonstrating difficulty counting backwards by 7, sentence repetition, abstraction, and delayed recall. Patient able to recall 0/5 word with category cues but 5/5 words with multiple choice cues. Results discussed with patient. Patient would benefit from education on memory compensatory strategies in subsequent treatment sessions. Patient ambulated ~68f toward dayroom from ADL kitchen with CGA and use of rollator with 1 seated rest break. Total A for transport the rest of the way to conserve energy. Therapeutic exercise on NuStep for 2 bouts of 2-3 minutes with focus on endurance and activity tolerance in prep for completion of ADLs. Patient ambulated an additional ~228ffrom nurses station to room with CGA. Session concluded with patient lying supine in bed with call bell within reach, bed alarm activated, and all needs met. Patient's daughter and wife present at bedside.     Therapy Documentation Precautions:  Precautions Precautions: Fall Precaution Comments: ostomy, foley Restrictions Weight Bearing Restrictions: No Other Position/Activity Restrictions: Pt w/ L hemiarthoplasty 02/18/20,  posterior hip precautions  Therapy/Group: Individual Therapy  Sandar Krinke R [place] 03/26/2020, 1:59 PM

## 2020-03-27 ENCOUNTER — Inpatient Hospital Stay (HOSPITAL_COMMUNITY): Payer: BLUE CROSS/BLUE SHIELD

## 2020-03-27 ENCOUNTER — Inpatient Hospital Stay (HOSPITAL_COMMUNITY): Payer: BLUE CROSS/BLUE SHIELD | Admitting: Physical Therapy

## 2020-03-27 DIAGNOSIS — B3749 Other urogenital candidiasis: Secondary | ICD-10-CM

## 2020-03-27 DIAGNOSIS — Z66 Do not resuscitate: Secondary | ICD-10-CM

## 2020-03-27 DIAGNOSIS — F419 Anxiety disorder, unspecified: Secondary | ICD-10-CM

## 2020-03-27 DIAGNOSIS — F329 Major depressive disorder, single episode, unspecified: Secondary | ICD-10-CM

## 2020-03-27 DIAGNOSIS — Z515 Encounter for palliative care: Secondary | ICD-10-CM

## 2020-03-27 DIAGNOSIS — J189 Pneumonia, unspecified organism: Secondary | ICD-10-CM

## 2020-03-27 DIAGNOSIS — Z7189 Other specified counseling: Secondary | ICD-10-CM

## 2020-03-27 LAB — BASIC METABOLIC PANEL
Anion gap: 9 (ref 5–15)
BUN: 20 mg/dL (ref 8–23)
CO2: 25 mmol/L (ref 22–32)
Calcium: 9 mg/dL (ref 8.9–10.3)
Chloride: 99 mmol/L (ref 98–111)
Creatinine, Ser: 0.59 mg/dL — ABNORMAL LOW (ref 0.61–1.24)
GFR calc Af Amer: >60 mL/min (ref >60)
GFR calc non Af Amer: >60 mL/min (ref >60)
Glucose, Bld: 82 mg/dL (ref 70–99)
Potassium: 4 mmol/L (ref 3.5–5.1)
Sodium: 133 mmol/L — ABNORMAL LOW (ref 135–145)

## 2020-03-27 MED ORDER — ADULT MULTIVITAMIN W/MINERALS CH
1.0000 | ORAL_TABLET | Freq: Every day | ORAL | Status: DC
  Administered 2020-03-27 – 2020-04-01 (×6): 1 via ORAL
  Filled 2020-03-27 (×5): qty 1

## 2020-03-27 MED ORDER — MEGESTROL ACETATE 400 MG/10ML PO SUSP
400.0000 mg | Freq: Two times a day (BID) | ORAL | Status: DC
  Administered 2020-03-27 – 2020-04-01 (×11): 400 mg via ORAL
  Filled 2020-03-27 (×11): qty 10

## 2020-03-27 MED ORDER — DM-GUAIFENESIN ER 30-600 MG PO TB12: 1.0000 | ORAL_TABLET | Freq: Two times a day (BID) | ORAL | Status: DC

## 2020-03-27 MED ORDER — GUAIFENESIN-DM 100-10 MG/5ML PO SYRP
10.0000 mL | ORAL_SOLUTION | Freq: Four times a day (QID) | ORAL | Status: DC | PRN
  Administered 2020-03-27 – 2020-03-31 (×6): 10 mL via ORAL
  Filled 2020-03-27 (×7): qty 10

## 2020-03-27 MED ORDER — TRAZODONE HCL 50 MG PO TABS: 50.0000 mg | ORAL_TABLET | Freq: Every day | ORAL | Status: DC

## 2020-03-27 MED ORDER — CHLORHEXIDINE GLUCONATE CLOTH 2 % EX PADS
6.0000 | MEDICATED_PAD | Freq: Two times a day (BID) | CUTANEOUS | Status: DC
  Administered 2020-03-27 – 2020-04-01 (×10): 6 via TOPICAL

## 2020-03-27 MED ORDER — ALPRAZOLAM 0.25 MG PO TABS
0.2500 mg | ORAL_TABLET | Freq: Two times a day (BID) | ORAL | Status: DC | PRN
  Filled 2020-03-27: qty 1

## 2020-03-27 MED ORDER — MIRTAZAPINE 15 MG PO TABS
7.5000 mg | ORAL_TABLET | Freq: Every day | ORAL | Status: DC
  Administered 2020-03-27: 7.5 mg via ORAL
  Filled 2020-03-27: qty 1

## 2020-03-27 MED ORDER — IPRATROPIUM-ALBUTEROL 0.5-2.5 (3) MG/3ML IN SOLN
3.0000 mL | Freq: Four times a day (QID) | RESPIRATORY_TRACT | Status: DC
  Administered 2020-03-27 – 2020-03-28 (×6): 3 mL via RESPIRATORY_TRACT
  Filled 2020-03-27 (×5): qty 3

## 2020-03-27 NOTE — Progress Notes (Signed)
Occupational Therapy Session Note  Patient Details  Name: Anthony Curry. MRN: [id number] Date of Birth: 04/04/1941  Today's Date: 03/27/2020 OT Individual Time: 8115-7262 OT Individual Time Calculation (min): 45 min    Short Term Goals: Week 1:     Skilled Therapeutic Interventions/Progress Updates:    1:1. Pt received in bed agreeable to OT. Pt completes mobility with rolator and MAX VC for locking brakes andCGA. Pt requires MAX A to doff LB clothing over catheter and S for UB. OT applied oclusives to PICC and ostomy and pt bathes at S level for washing 9/10 body parts in sitting, OT washes butock in standing with CGA. Pt dons shirt with S, but requires MOD A overall to don underwear and pants d/t fatigue with CGA for stanidng balance. Exited sesion with pt seated in w/c, exit alarm on and call light tin reach   Therapy Documentation Precautions:  Precautions Precautions: Fall Precaution Comments: ostomy, foley Restrictions Weight Bearing Restrictions: No Other Position/Activity Restrictions: Pt w/ L hemiarthoplasty 02/18/20, posterior hip precautions General: General PT Missed Treatment Reason: Unavailable (Comment)(chaplain present) Vital Signs: Therapy Vitals BP: (!) 166/70 Pain:   ADL:   Vision   Perception    Praxis   Exercises:   Other Treatments:     Therapy/Group: Individual Therapy  [name] 03/27/2020, 11:18 AM

## 2020-03-27 NOTE — IPOC Note (Signed)
Overall Plan of Care [facility]) Patient Details Name: Anthony Curry. MRN: [id number] DOB: 23-Feb-1941  Admitting Diagnosis: Debility  Hospital Problems: Principal Problem:   Debility Active Problems:   Palliative care by specialist   Goals of care, counseling/discussion   DNR (do not resuscitate)     Functional Problem List: Nursing Perception  PT Balance, Endurance, Safety, Sensory  OT Balance, Cognition, Endurance, Motor, Pain, Safety, Sensory, Skin Integrity  SLP    TR         Basic ADL's: OT Grooming, Bathing, Dressing, Toileting     Advanced  ADL's: OT       Transfers: PT Bed Mobility, Floor, Bed to Chair, Car, Manufacturing systems engineer, Metallurgist: PT Stairs, Ambulation     Additional Impairments: OT    SLP        TR      Anticipated Outcomes Item Anticipated Outcome  Self Feeding MOD I  Swallowing      Basic self-care  S  Toileting  S   Bathroom Transfers S  Bowel/Bladder  Patient will remain continent of bowel and bladder for remainder of stay  Transfers  supervision  Locomotion  supervision household gait w/ LRAD  Communication     Cognition     Pain  Patient will remain pain free for duration of stay  Safety/Judgment  Patient will safetly be able navigate remainder of stay   Therapy Plan: PT Intensity: Minimum of 1-2 x/day ,45 to 90 minutes PT Frequency: 5 out of 7 days PT Duration Estimated Length of Stay: 5-7 days OT Intensity: Minimum of 1-2 x/day, 45 to 90 minutes OT Frequency: 5 out of 7 days OT Duration/Estimated Length of Stay: 5-7     Due to the current state of emergency, patients may not be receiving their 3-hours of Medicare-mandated therapy.   Team Interventions: Nursing Interventions Patient/Family Education, Psychosocial Support  PT interventions Ambulation/gait training, Discharge planning, DME/adaptive equipment instruction, Functional mobility training, Pain management, Psychosocial support,  Splinting/orthotics, Therapeutic Activities, UE/LE Strength taining/ROM, UE/LE Coordination activities, Therapeutic Exercise, Stair training, Skin care/wound management, Patient/family education, Neuromuscular re-education, Disease management/prevention, Community reintegration, Training and development officer, Cognitive remediation/compensation  OT Interventions Training and development officer, Engineer, drilling, Patient/family education, Therapeutic Activities, Wheelchair propulsion/positioning, Cognitive remediation/compensation, Psychosocial support, Therapeutic Exercise, UE/LE Strength taining/ROM, Self Care/advanced ADL retraining, Functional mobility training, Community reintegration, Discharge planning, Neuromuscular re-education, Skin care/wound managment, UE/LE Coordination activities, Visual/perceptual remediation/compensation, Splinting/orthotics, Pain management, Disease mangement/prevention  SLP Interventions    TR Interventions    SW/CM Interventions Discharge Planning, Psychosocial Support, Patient/Family Education   Barriers to Discharge MD  Medical stability  Nursing Behavior    PT Medical stability, Other (comments) Pt w/ stage IV lung cancer.  OT      SLP      SW Decreased caregiver support, Lack of/limited family support     Team Discharge Planning: Destination: PT-Home ,OT- Home , SLP-  Projected Follow-up: PT-Home health PT, OT-  Home health OT, SLP-  Projected Equipment Needs: PT-To be determined, OT- 3 in 1 bedside comode, To be determined, SLP-  Equipment Details: PT-has rollator, OT-  Patient/family involved in discharge planning: PT- Patient,  OT-Patient, SLP-   MD ELOS: 5-7 days Medical Rehab Prognosis:  Excellent Assessment: The patient has been admitted for CIR therapies with the diagnosis of debility related to multiple medical. Course complicated by depression. The team will be addressing functional mobility, strength, stamina, balance, safety,  adaptive techniques and equipment, self-care, bowel  and bladder mgt, patient and caregiver education, NMR, community reentry, ego support. Goals have been set at supervision with mobility and self-care.   Due to the current state of emergency, patients may not be receiving their 3 hours per day of Medicare-mandated therapy.    Meredith Staggers, MD, FAAPMR      See Team Conference Notes for weekly updates to the plan of care

## 2020-03-27 NOTE — Progress Notes (Addendum)
PHYSICAL MEDICINE & REHABILITATION PROGRESS NOTE   Subjective/Complaints: Pt expresses depression, anxiety. Concerned about his cough and that he has no appetite. Not sleeping well. Didn't take trazodone last night. Worried about increased heart rate with activity  ROS: Limited otherwise due to behavioral   Objective:   DG CHEST PORT 1 VIEW  Result Date: 03/26/2020 CLINICAL DATA:  Shortness of breath. EXAM: PORTABLE CHEST 1 VIEW COMPARISON:  Prior chest radiograph 02/24/2020, chest CT 01/23/2020 FINDINGS: Heart size within normal limits. Aortic atherosclerosis. Redemonstrated 3.1 cm right upper lobe pulmonary nodule. Please refer to prior chest CT 01/23/2020 for description of additional pulmonary nodules. Redemonstrated emphysema with scarring and architectural distortion. Ill-defined opacity at the bilateral lung bases may reflect atelectasis. Pneumonia cannot be excluded. On the left lateral costophrenic angle is not entirely included in the field of view. Within this limitation, no sizable pleural effusion is evident. No evidence of pneumothorax. Old right-sided rib fracture deformities. Chronic deformity of the proximal right humerus. IMPRESSION: Ill-defined opacity at the bilateral lung bases may reflect atelectasis. Pneumonia cannot be definitively excluded. Consider radiographic follow-up. Redemonstrated 3.1 cm right upper lobe pulmonary nodule in this patient with known pulmonary malignancy. Background pulmonary emphysema with scarring and architectural distortion. Aortic atherosclerosis. Electronically Signed   By: Kellie Simmering DO   On: 03/26/2020 14:17   Recent Labs    03/26/20 0709  WBC 10.4  HGB 13.3  HCT 39.5  PLT 469*   Recent Labs    03/26/20 0709 03/27/20 0645  NA 133* 133*  K 3.3* 4.0  CL 95* 99  CO2 28 25  GLUCOSE 95 82  BUN 19 20  CREATININE 0.66 0.59*  CALCIUM 9.2 9.0    Intake/Output Summary (Last 24 hours) at 03/27/2020 0955 Last data filed at  03/27/2020 0640 Gross per 24 hour  Intake 440 ml  Output 1975 ml  Net -1535 ml     Physical Exam: Vital Signs Blood pressure (!) 166/70, pulse 63, temperature 97.8 F (36.6 C), temperature source Oral, resp. rate 18, SpO2 98 %. Constitutional: No distress . Vital signs reviewed. Frail appearing HEENT: EOMI, oral membranes moist Neck: supple Cardiovascular: RRR without murmur. No JVD    Respiratory/Chest:  scattered wheezes. occ rhonchi. Normal effort    GI/Abdomen: BS +, non-tender, non-distended, ostomy Ext: no clubbing, cyanosis, or edema Psych: anxious, distracted, tangential  Musculoskeletal:     Comments: No edema or tenderness in extremities  Neurological: He is alert.  Motor: BUE: 4+/5 throughout RLE: 4/5 proximally, 5/5 ADF LLE: HF, KE 4/5, ADF 5/5  Skin: Skin is warm and dry.     Assessment/Plan: 1. Functional deficits secondary to debility which require 3+ hours per day of interdisciplinary therapy in a comprehensive inpatient rehab setting.  Physiatrist is providing close team supervision and 24 hour management of active medical problems listed below.  Physiatrist and rehab team continue to assess barriers to discharge/monitor patient progress toward functional and medical goals  Care Tool:  Bathing              Bathing assist       Upper Body Dressing/Undressing Upper body dressing   What is the patient wearing?: Button up shirt    Upper body assist Assist Level: Maximal Assistance - Patient 25 - 49%    Lower Body Dressing/Undressing Lower body dressing            Lower body assist       Toileting Toileting Toileting Activity did  not occur Landscape architect and hygiene only): (ostomy/foley)  Toileting assist Assist for toileting: Supervision/Verbal cueing     Transfers Chair/bed transfer  Transfers assist     Chair/bed transfer assist level: Contact Guard/Touching assist     Locomotion Ambulation   Ambulation assist       Assist level: Contact Guard/Touching assist Assistive device: Rollator Max distance: 125'   Walk 10 feet activity   Assist     Assist level: Contact Guard/Touching assist Assistive device: Rollator   Walk 50 feet activity   Assist    Assist level: Contact Guard/Touching assist Assistive device: Rollator    Walk 150 feet activity   Assist Walk 150 feet activity did not occur: Safety/medical concerns         Walk 10 feet on uneven surface  activity   Assist Walk 10 feet on uneven surfaces activity did not occur: Safety/medical concerns         Wheelchair     Assist     Wheelchair activity did not occur: N/A         Wheelchair 50 feet with 2 turns activity    Assist    Wheelchair 50 feet with 2 turns activity did not occur: N/A       Wheelchair 150 feet activity     Assist  Wheelchair 150 feet activity did not occur: N/A       Blood pressure (!) 166/70, pulse 63, temperature 97.8 F (36.6 C), temperature source Oral, resp. rate 18, SpO2 98 %.    Medical Problem List and Plan: 1. Shuffling gait with poor safety awareness, poor endurance, weakness, DOE secondary to debility.             -patient may shower             -ELOS/Goals: 5-9 days/Supervision/Mod I             -continue therapies. Encouraged patient that his stay should be short. Also told him that we would be checking his oxygen saturation in room and  When he's up with therapies 2.  Antithrombotics: -DVT/anticoagulation:  Pharmaceutical: Other (comment)--Eliquis             -antiplatelet therapy: N/A  3. Rib fracturs/back pain/Pain Management: Vicodin prn for pain.              Monitor with increased exertion.  4. Mood: LCSW to follow for evaluation and support.               -antipsychotic agents: N/A  -APPRECIATE palliative care consult  -neuropsych consulted for mood assessment and recs  -after discussion with pharmacy will try low dose remeron tonight, 7.5 mg.  Can overlap a few days with fluconazole.   -add low dose xanax prn for anxiety 5. Neuropsych: This patient is capable of making decisions on his own behalf.    6. Skin/Wound Care: Routine pressure relief measures. Added ensure and boost for supplement as appetite is poor but wants supplements 7. Fluids/Electrolytes/Nutrition: very poor appetite  4/23 -will begin megace bid (pt agreed)   -K+ up to 4.0, continue kdur 8. Hypoxia/DOE/Pnemonitis: Multifactorial due to Covid PNA, lung cancer, pericardial effusion and pneumonitis. Completed course of antibiotics 4/20.   Prednisone taper  4/23 cxr reviewed from yesterday with patient.  CXR demonstrates ?atelectasis, RUL nodule, baseline scarring.   -continue mucinex dm, prn robitussin   -will schedule his nebs   -oxygen as needed   -anxiety playing a big role  9. Stage  IV Lung cancer s/p XRT: Chemo on hold: To follow up on outpatient basis.  10. Hyponatremia: Has been in 129-130 range. Asymptomatic. Continue demeclocycline bid             4/22-23 Sodium holding at 133. Continue demeclocycline    -serial BMET's while on remeron 11. Candida UTI and esophageal/oral thrush:   4/23 Continue diflucan through monday 12. Left hip hemi: THR, precautions for 6 weeks. No squats or twisting indefinitely.  13. Gout flare left knee: On colchicine with improvement of symptoms.  14. Insomnia: remeron scheduled and prn xanax 15. Dysphonia: may be secondary to esophageal thrush: continue flomax thru monday    LOS: 2 days A FACE TO FACE EVALUATION WAS PERFORMED  Meredith Staggers 03/27/2020, 9:55 AM

## 2020-03-27 NOTE — Progress Notes (Signed)
Occupational Therapy Session Note  Patient Details  Name: Anthony Curry. MRN: [id number] Date of Birth: 09/08/1941  Today's Date: 03/27/2020 OT Individual Time: 1300-1315 and 1515-1545 OT Individual Time Calculation (min): 15 min and 30 min   Short Term Goals: Week 1:     Skilled Therapeutic Interventions/Progress Updates:    1;1. Pt received in w/c reporting max fatigue and requesting reschedule. Pt dons non skid socks with set up in w/c and transfer to EOB with rolator with CGA and VC for brake management. Pt apologizes for missing time and wanting to reschedule, but is "just so tired." requesting to do Nu step when OT returns to make up time after edu re POC and needing to participate in tx to improve functional outcomes. Exited session with pt seate din bed, exit alram on and call light in reach  Upon return second session 30 min: Pt agreeable to OT, pt completes ambulation with rolator in tx gym 2x75' with Vc for relaxing shoulders, looking ahead instead of at floor and deep breathing. O2 sats remained in higg 90s with HR <106 after each bout of fnxl mobility. On second trial pt weaves in/out of cones to simulate tight turns in home or community environment with 1 seated rest break on rolator d/t dizziness reported after last cone from "those tight turns." Pt walks back to room from tx gym with CGA and 1 seated rest break with pt plopping back into unlocked w/c without any warning with OT steadying chair to keep from scooting back and pt falling. Pt educated on safety risks and needing to slow down even if he is tired. Exited session iwht pt seate din bed, exi talarm on and call light tin reach  Therapy Documentation Precautions:  Precautions Precautions: Fall Precaution Comments: ostomy, foley Restrictions Weight Bearing Restrictions: No Other Position/Activity Restrictions: Pt w/ L hemiarthoplasty 02/18/20, posterior hip precautions General: General PT Missed Treatment Reason:  Unavailable (Comment)(chaplain present) Vital Signs: Oxygen Therapy SpO2: 95 % O2 Device: Room Air Pain:   ADL:   Vision   Perception    Praxis   Exercises:   Other Treatments:     Therapy/Group: Individual Therapy  [name] 03/27/2020, 1:01 PM

## 2020-03-27 NOTE — Progress Notes (Signed)
Initial Nutrition Assessment  DOCUMENTATION CODES:   Severe malnutrition in context of chronic illness  INTERVENTION:  MVI daily  Continue Ensure Enlive TID  Continue Boost Plus BID with meals   NUTRITION DIAGNOSIS:   Severe Malnutrition related to chronic illness(lung cancer) as evidenced by energy intake < or equal to 75% for > or equal to 1 month, severe muscle depletion, severe fat depletion.   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 Pt was readmitted on 02/17/20 with left femoral neck fracture s/p left hip hemi and was discharged to SNF for therapy but pt elected to go home next day. Pt admitted again on 03/13/20 s/p fall. Pt found to have increased pericardial effusion and rib fractures. Admitted to CIR on 4/21.  Pt C/O poor appetite. PTA, pt only ate 2 meals per day. For breakfast, he would eat scrambled eggs and some toast around 10am. He would then eat a balanced dinner around 4pm. Pt would supplement with Ensure/Boost during the day, drinking 2-3 per day. Pt expressed frustration with menu on rehab unit. In previous units, pt was able to order what he wanted and at the time he wanted. Pt is not adjusted to the meal schedule available on rehab. RD offered to order snacks for him that were more similar to the times he is accustomed to eating, but pt declined and stated "let's just leave things how they are for now." Pt states that he is drinking 3-4 Ensures per day now. Pt unsure of his usual bodyweight and  unsure if he has had any weight changes. Pt also unsure if his clothes are fitting differently due to typically wearing pants with an elastic band.   Palliative care is following.   Current weight: 60.8 kg Per wt readings, pt weighed 65.8 kg on 01/02/20. This would indicate a potential 7.6% wt loss x3 months, which is significant for time frame.   PO Intake: 50-75% x 4 recorded meals (62% average intake)  UOP: 1,641ml x24 hours I/O: -2,075ml since admit  Medications reviewed and include: Ensure Enlive TID, Boost Plus BID, Megace, Klor-con, Deltasone, Florastor  Per MD, nutrition supplements are not to be counted in the fluid restrictions (1877ml)  Labs reviewed: Na 133 (L)   NUTRITION - FOCUSED PHYSICAL EXAM:    Most Recent Value  Orbital Region  Severe depletion  Upper Arm Region  Severe depletion  Thoracic and Lumbar Region  Severe depletion  Buccal Region  Severe depletion  Temple Region  Severe depletion  Clavicle Bone Region  Severe depletion  Clavicle and Acromion Bone Region  Severe depletion  Scapular Bone Region  Severe depletion  Dorsal Hand  Severe depletion  Patellar Region  Severe depletion  Anterior Thigh Region  Severe depletion  Posterior Calf Region  Severe depletion  Edema (RD Assessment)  None  Hair  Reviewed  Eyes  Reviewed  Mouth  Reviewed  Skin  Reviewed  Nails  Reviewed       Diet Order:   Diet Order  Diet regular Room service appropriate? Yes; Fluid consistency: Thin; Fluid restriction: 1800 mL Fluid  Diet effective now              EDUCATION NEEDS:   No education needs have been identified at this time  Skin:  Skin Assessment: Skin Integrity Issues: Skin Integrity Issues:: Stage I Stage I: coccyx  Last BM:  unknown  Height:   Ht Readings from Last 1 Encounters:  03/13/20 5\' 11"  (1.803 m)    Weight:   Wt Readings from Last 10 Encounters:  03/25/20 60.8 kg  03/12/20 64.9 kg  03/03/20 65 kg  03/02/20 57 kg   02/17/20 57.3 kg  02/06/20 60.8 kg  01/30/20 61 kg  01/27/20 61 kg  01/02/20 65.8 kg  12/02/19 66.6 kg    BMI:  There is no height or weight on file to calculate BMI.  Estimated Nutritional Needs:   Kcal:  2000-2200  Protein:  100-110 grams  Fluid:  1.8 L (per MD)    Larkin Ina, MS, RD, LDN RD pager number and weekend/on-call pager number located in [place].

## 2020-03-27 NOTE — Consult Note (Signed)
Consultation Note Date: 03/27/2020   Patient Name: Anthony Curry.  DOB: 11/23/1941  MRN: [id number]  Age / Sex: 79 y.o., male  PCP: Patient, No Pcp Per Referring Physician: Meredith Staggers, MD  Reason for Consultation: Establishing goals of care  HPI/Patient Profile: Patient is an  79 year old male with history of chronic A. 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 Patient presented to ED on 4/9 with a fall of from his wheelchair.    Palliative care has been asked to aid in goals of care conversations. Patient has been chronically declining and has ongoing issues with fatigue, poor appetite and dizziness.  Patient lost balance and fell without losing consciousness.  Clinical Assessment and Goals of Care: I have reviewed medical records including EPIC notes, labs and imaging, received report from bedside RN, assessed the patient.    I met with Anthony Curry to further discuss diagnosis prognosis, GOC, EOL wishes, disposition and options.   I introduced Palliative Medicine as specialized medical care for people living with serious illness. It focuses on providing relief from the symptoms and stress of a serious illness. The goal is to improve quality of life for both the patient and the family.  I asked Anthony Curry to tell me about himself. Anthony Curry states that he is from [place]. He went into the [employer] where he served for fours years. He met his wife, [name] in [place] they have since been married for fifty-nine years. They share three children together one of whom is deceased due to drug abuse. He later worked at [employer] where he stayed for forty five years. He grew up a strict Catholic but no longer practices.   In regard to  illicit habits he use to drink and smoke but has since stopped.   Prior to hospitalizations the patient said that he was fully able bodied. He was able to perform all bADLs though did require the use of a walker. He said that he was the person who completed all of the home tasks.   I asked Anthony Curry to share with me how his health has been. He recounted his clinical course from 2014 when he was diagnosed with Stg IV lung CA. He shared that he was "kicking lung cancers ass" until he contracted COVID-19. He said that he ended up hospitalized four times in the setting of complications from both COVID and diverticulitis. 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 He shares that this is not the way he would want to live his life if he had a choice. I asked him to explain to me more what he meant by that comment. He said that he "never pictured himself having a bag out of his stomach." I acknowledged his distress by this in terms of how it changes his body image. He said that he has really struggled with this and with the reality that  he has required a foley catheter. He said that he also does not want to be at a point whereby he would require to straight catheterize himself.   Regarding personal goals the patient would like to gain strength. He states that he has had poor stamina and worries that he will not be able to recover. He vocalizes optimism for future improvement but clearly feels upset by the decline in his body over the past few months. He would like to be able to eat more so that he can get a follow up surgery to remove his ostomy.   A detailed discussion was had today regarding advanced directives, there are none currently filed.    We together reviewed a MOST form, concepts specific to code status, artifical feeding and hydration, continued IV antibiotics and rehospitalization was had.  He had previously identified that he would not want to live long term on  anytime of "machinery". While with him he said that he would want "one round" of CPR. I shared with him that this could cause great trauma to his body and I do not feel the result would be anything but   The difference between a aggressive medical intervention path  and a palliative comfort care path for this patient at this time was had.    Anthony Curry shares that he would like to think about our conversation. I asked him if I could come back tomorrow so that we may talk more about this which he is open to.  Discussed the importance of continued conversation with family and their  medical providers regarding overall plan of care and treatment options, ensuring decisions are within the context of the patients values and GOCs.  From a symptom perspective the patient complains of constant "worry". He said that he use to take mirtazapine though he was not on it long enough to identify if it did or did not have an effect. He complains of chronic generalized pain. He states that he has a lack of appetite and needs to "force himself to eat." He also shares that he usually wakes up late in the morning and throughout his life has typically eaten around 10AM and 4PM and this was it. He shares that he has always had trouble keeping weight on. He complains of an ongoing cough for the past two months. We discussed his ongoing insomnia that is driven by all of the "interruptions" throughout the daytime.   Decision Maker: Abhimanyu Cruces (Spouse) - [phone]   SUMMARY OF RECOMMENDATIONS   DNAR/DNI  Spiritual Care Help w/ advance Directives  TOC --> OP Palliative Care Referral  Ongoing GOC discussion in the oncoming days  Code Status/Advance Care Planning:  DNR  Symptom Management:  Failure to Thrive:  - Dietary consult, on BOOST supplements  - Allow patient to choose the time he would like to eat  - OOB for all meals  - Megace started per primary  Anxiety/Depression:  - Patient was prior on  mirtazapine would restart this once he is stable from a hyponatremia perspective  - Patient would benefit from OP CBT  Insomnia:  - Agree with trazodone will change to 23m standing QHS  - Minimize interruptions  - Patient has alprazolam ordered though he states he would prefer not to use this  Generalized Pain:  - Norco Q4H PRN  Cough:  - Mucinex Q12H  Muscular Debility:  - In CIR  - Daily PT/OT  Spiritual:                 -  Chaplain consult  Need for emotional support:  - Utilize therapeutic listening  - Ensure safe space to express concerns  - Sit next to patient/family making eye contact  Polypharmacy:  - Patient requests that we minimize medication burden as much as possible  Palliative Prophylaxis:   Aspiration, Bowel Regimen, Delirium Protocol, Eye Care, Frequent Pain Assessment, Oral Care, Palliative Wound Care and Turn Reposition  Additional Recommendations (Limitations, Scope, Preferences):  Minimize Medications  Psycho-social/Spiritual:   Desire for further Chaplaincy support: Yes  Additional Recommendations: Caregiving  Support/Resources  Prognosis:   Unable to determine  Discharge Planning: [place] for rehab with Palliative care service follow-up     Primary Diagnoses: Present on Admission: . Debility  I have reviewed the medical record, interviewed the patient and family, and examined the patient. The following aspects are pertinent. Past Medical History:  Diagnosis Date  . 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 Adrenal mass ([place])   . Alcoholism in remission ([place])   . Anemia of chronic disease   . Ankle fracture    right  . 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 Anxiety   . Atrial fibrillation, chronic ([place])   . BPH (benign prostatic hyperplasia)   . Cancer ([place])    small cell/lung  . Complication of anesthesia 09/08/2015    JITTERY AFTER GALLBLADDER SURGERY  . COVID-19 12/2019  . Depression   .  Epididymoorchitis 01/30/2020   with right hydrocele  . Femur fracture ([place])   . GERD (gastroesophageal reflux disease)   . History of radiation therapy 11/14/16, 11/18/16, 11/22/16   Left upper lung: 54 Gy in 3 fractions  . Hypertension   . Hyponatremia   . Multiple falls   . Neoplastic malignant related fatigue    IV tx. every 2 weeks last9-14-16 (Dr. Earlie Server), radiation last tx. 6 months  . 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 Perforated diverticulum   . Pericardial effusion   . Persistent atrial fibrillation ([place])   . Protein calorie malnutrition ([place])   . Pulmonary nodules   . Radiation 12/02/14-12/16/14   Left adrenal gland 30 Gy in 10 fractions  . Stage 4 lung cancer, unspecified laterality ([place])   . Urinary retention    Social History   Socioeconomic History  . Marital status: Single    Spouse name: Camarion Weier  . Number of children: 3  . Years of education: 19  . Highest education level: Not on file  Occupational History  . 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 Occupation: retired Dealer  Tobacco Use  . Smoking status: Former Smoker    Packs/day: 1.00    Years: 30.00    Pack years: 30.00    Types: Cigarettes    Quit date: 12/05/1990    Years since quitting: 29.3  . Smokeless tobacco: Never Used  Substance and Sexual Activity  . Alcohol use: Not Currently    Comment: h/o heavy use  . Drug use: Never  . Sexual activity: Not Currently  Other Topics Concern  . Not on file  Social History Narrative   ** Merged History Encounter **       Lives at home with wife. Caffeine use: Drinks coffee- 2 cups per day Retired from [employer] Strain:   . Difficulty of Paying Living Expenses:   Food Insecurity:   . Worried About Charity fundraiser in the Last Year:   .  Ran Out of Food in the Last Year:   Transportation Needs:   . Film/video editor (Medical):   [name]  Lack of Transportation (Non-Medical):   Physical Activity:   . Days of Exercise per Week:   . Minutes of Exercise per Session:   Stress:   . Feeling of Stress :   Social Connections:   . Frequency of Communication with Friends and Family:   . Frequency of Social Gatherings with Friends and Family:   . Attends Religious Services:   . Active Member of Clubs or Organizations:   . Attends Archivist Meetings:   [name] Marital Status:    Family History  Problem Relation Age of Onset  . Lung cancer Mother   . Heart attack Father   . Brain cancer Brother   . Hypertension Father   . Diabetes Paternal Grandmother   . Heart attack Brother   . Neuropathy Neg Hx    Scheduled Meds: . apixaban  5 mg Oral BID  . Chlorhexidine Gluconate Cloth  6 each Topical BID  . colchicine  0.6 mg Oral BID  . demeclocycline  300 mg Oral BID  . dextromethorphan-guaiFENesin  1 tablet Oral BID  . diltiazem  180 mg Oral Daily  . feeding supplement (ENSURE ENLIVE)  237 mL Oral TID BM  . fluconazole  100 mg Oral Daily  . lactose free nutrition  237 mL Oral BID WC  . levothyroxine  50 mcg Oral Q0600  . lidocaine  1 patch Transdermal Q24H  . magic mouthwash  10 mL Oral QID  . potassium chloride  20 mEq Oral BID  . predniSONE  50 mg Oral Q breakfast   Followed by  . [START ON 04/02/2020] predniSONE  40 mg Oral Q breakfast   Followed by  . [START ON 04/04/2020] predniSONE  30 mg Oral Q breakfast   Followed by  . [START ON 04/06/2020] predniSONE  20 mg Oral Q breakfast   Followed by  . [START ON 04/08/2020] predniSONE  10 mg Oral Q breakfast   Followed by  . [START ON 04/10/2020] predniSONE  5 mg Oral Q breakfast  . saccharomyces boulardii  250 mg Oral BID  . sodium chloride  1 spray Each Nare TID AC  . tamsulosin  0.4 mg Oral BID   Continuous Infusions: PRN Meds:.acetaminophen, ALPRAZolam, alum & mag hydroxide-simeth, bisacodyl, diphenhydrAMINE, guaiFENesin-dextromethorphan, HYDROcodone-acetaminophen,  ipratropium-albuterol, menthol-cetylpyridinium, polyethylene glycol, prochlorperazine **OR** prochlorperazine **OR** prochlorperazine, sodium phosphate, traZODone Medications Prior to Admission:  Prior to Admission medications   Medication Sig Start Date End Date Taking? Authorizing Provider  ALPRAZolam Duanne Moron) 0.25 MG tablet Take 0.25 mg by mouth at bedtime as needed for anxiety.    [provider]  apixaban (ELIQUIS) 5 MG TABS tablet Take 5 mg by mouth 2 (two) times daily.    [provider]  apixaban (ELIQUIS) 5 MG TABS tablet Take 5 mg by mouth 2 (two) times daily.    [provider]  benzonatate (TESSALON) 200 MG capsule Take 1 capsule (200 mg total) by mouth 3 (three) times daily as needed for cough. 02/25/20   Terrilee Croak, MD  bisacodyl (DULCOLAX) 5 MG EC tablet Take 1 tablet (5 mg total) by mouth daily as needed for moderate constipation. 03/25/20   Terrilee Croak, MD  demeclocycline (DECLOMYCIN) 150 MG tablet Take 2 tablets (300 mg total) by mouth 2 (two) times daily. 03/04/20   Heilingoetter, Cassandra L, PA-C  demeclocycline (DECLOMYCIN) 150 MG tablet Take 2  tablets (300 mg total) by mouth 2 (two) times daily. 03/25/20   Dahal, Marlowe Aschoff, MD  dextromethorphan-guaiFENesin (MUCINEX DM) 30-600 MG 12hr tablet Take 1 tablet by mouth 2 (two) times daily. 03/25/20   Terrilee Croak, MD  diltiazem (CARDIZEM CD) 180 MG 24 hr capsule Take 1 capsule (180 mg total) by mouth daily at 12 noon. 01/09/15   Sherran Needs, NP  diltiazem (DILACOR XR) 180 MG 24 hr capsule Take 180 mg by mouth daily.    [provider]  docusate sodium (COLACE) 100 MG capsule Take 1 capsule (100 mg total) by mouth 2 (two) times daily. 03/25/20   Terrilee Croak, MD  ferrous sulfate 325 (65 FE) MG tablet Take 1 tablet (325 mg total) by mouth 3 (three) times daily after meals. 02/25/20   Terrilee Croak, MD  finasteride (PROSCAR) 5 MG tablet Take 1 tablet (5 mg total) by mouth daily. 01/10/20   Ghimire,  Henreitta Leber, MD  fluconazole (DIFLUCAN) 100 MG tablet Take 1 tablet (100 mg total) by mouth daily for 3 days. 03/26/20 03/29/20  Terrilee Croak, MD  fluticasone (FLONASE) 50 MCG/ACT nasal spray Place 2 sprays into both nostrils at bedtime.  10/17/19   [provider]  guaiFENesin (MUCINEX) 600 MG 12 hr tablet Take 1 tablet (600 mg total) by mouth 2 (two) times daily. 02/25/20   Dahal, Marlowe Aschoff, MD  guaiFENesin-dextromethorphan (ROBITUSSIN DM) 100-10 MG/5ML syrup Take 5 mLs by mouth every 4 (four) hours as needed for cough (chest congestion). 03/25/20   Terrilee Croak, MD  HYDROcodone-acetaminophen (NORCO) 5-325 MG tablet Take 1 tablet by mouth every 6 (six) hours as needed for moderate pain. 03/03/20   Heilingoetter, Cassandra L, PA-C  HYDROcodone-acetaminophen (NORCO/VICODIN) 5-325 MG tablet Take 1 tablet by mouth 3 (three) times daily as needed for moderate pain.    [provider]  KLOR-CON M20 20 MEQ tablet Take 20 mEq by mouth daily at 12 noon.  10/18/14   [provider]  Lactobacillus (ACIDOPHILUS) CAPS capsule Take 1 capsule by mouth daily.    [provider]  levalbuterol Penne Lash) 0.63 MG/3ML nebulizer solution Take 3 mLs (0.63 mg total) by nebulization every 6 (six) hours as needed for wheezing or shortness of breath. 02/25/20   Dahal, Marlowe Aschoff, MD  levothyroxine (SYNTHROID) 50 MCG tablet Take 50 mcg by mouth daily before breakfast.    [provider]  levothyroxine (SYNTHROID, LEVOTHROID) 50 MCG tablet TAKE 1 TABLET (50 MCG TOTAL) BY MOUTH DAILY BEFORE BREAKFAST. 03/24/18   [name], MD  lidocaine (LIDODERM) 5 % Place 1 patch onto the skin daily. Remove & Discard patch within 12 hours or as directed by MD 03/25/20   Terrilee Croak, MD  MAGNESIUM PO Take 400 mg by mouth daily.     [provider]  menthol-cetylpyridinium (CEPACOL) 3 MG lozenge Take 1 lozenge (3 mg total) by mouth as needed for sore throat. 03/25/20   Terrilee Croak, MD  mirtazapine  (REMERON) 30 MG tablet Take 1 tablet (30 mg total) by mouth at bedtime. 03/04/20   Heilingoetter, Cassandra L, PA-C  mirtazapine (REMERON) 30 MG tablet Take 30 mg by mouth at bedtime.    [provider]  Nivolumab (OPDIVO IV) Inject as directed every 30 (thirty) days. Receives 465m every 28 days.    [provider]  Nivolumab (OPDIVO IV) Inject into the vein See admin instructions. Inject as directed every month.    [provider]  Nutritional Supplements (ENSURE COMPLETE PO) Take 1 Can  by mouth 2 (two) times daily.    [provider]  Omega-3 Fatty Acids (FISH OIL PO) Take 1,000 mg by mouth daily.     [provider]  OVER THE COUNTER MEDICATION Take 10 mLs by mouth daily. Over the counter liquid multivitamin    [provider]  pantoprazole (PROTONIX) 40 MG tablet Take 1 tablet (40 mg total) by mouth daily. 02/26/20   Dahal, Marlowe Aschoff, MD  polyethylene glycol (MIRALAX / GLYCOLAX) 17 g packet Take 17 g by mouth daily as needed for mild constipation. 03/25/20   Dahal, Marlowe Aschoff, MD  potassium chloride SA (KLOR-CON) 20 MEQ tablet Take 40 mEq by mouth 2 (two) times daily.     [provider]  predniSONE (DELTASONE) 20 MG tablet Take 3 tablets (60 mg total) by mouth daily with breakfast. 03/26/20   Dahal, Marlowe Aschoff, MD  sodium chloride (OCEAN) 0.65 % SOLN nasal spray Place 1 spray into both nostrils as needed for congestion. 03/25/20   Terrilee Croak, MD  tamsulosin (FLOMAX) 0.4 MG CAPS capsule Take 1 capsule (0.4 mg total) by mouth 2 (two) times daily. 01/10/20   Ghimire, Henreitta Leber, MD  tamsulosin (FLOMAX) 0.4 MG CAPS capsule Take 0.4 mg by mouth in the morning and at bedtime.    [provider]  thiamine (VITAMIN B-1) 100 MG tablet Take 1 tablet (100 mg total) by mouth daily. 09/11/13   Erick Colace, NP   Allergies  Allergen Reactions  . Aspirin Other (See Comments)    Cannot consume as a result of him taking Eliquis (Blood thinner)    Vital Signs: BP (!) 163/67   Pulse 63   Temp 97.8 F (36.6 C) (Oral)   Resp 18   SpO2 98%  Pain Scale: 0-10   Pain Score: 0-No pain SpO2: SpO2: 98 % O2 Device:SpO2: 98 % O2 Flow Rate: .   IO: Intake/output summary:   Intake/Output Summary (Last 24 hours) at 03/27/2020 3267 Last data filed at 03/27/2020 1245 Gross per 24 hour  Intake 680 ml  Output 2825 ml  Net -2145 ml   LBM: Last BM Date: (has colostomy) Baseline Weight:   Most recent weight:       Palliative Assessment/Data: 40%  Time In: 0630 Time Out: 0830 Time Total: 120 Greater than 50%  of this time was spent counseling and coordinating care related to the above assessment and plan.  Signed by: Rosezella Rumpf, NP   Please contact Palliative Medicine Team phone at [phone] for questions and concerns.  For individual provider: See Shea Evans

## 2020-03-27 NOTE — Progress Notes (Addendum)
Physical Therapy Session Note  Patient Details  Name: Anthony Curry. MRN: [id number] Date of Birth: 11-Jul-1941  Today's Date: 03/27/2020 PT Individual Time: 0900-0930 PT Individual Time Calculation (min): 30 min   Short Term Goals: Week 1:  PT Short Term Goal 1 (Week 1): =LTGs due to ELOS  Skilled Therapeutic Interventions/Progress Updates:   Pt received in supine and agreeable to therapy, no c/o pain. Supine>sit w/ supervision and pt donned shoes w/ supervision. SpO2 95% on RA and HR 75 bpm. Ambulated to therapy gym w/ close supervision-CGA using rollator, ~125', SpO2 95%, HR 107 bpm on RA. Practiced car transfer w/ supervision using rollator, verbal cues for technique. Ambulated back to room in 2 bouts using rollator. 1 seated rest break in rollator. Pt's spO2 remained >95% and HR 100-110s bpm. Pt reported feeling SOB, but therapist observed no labored breathing. Verbal cues for pursed-lip breathing. Once back in room, chaplain was present to visit w/ pt for advance directive. Ended session sitting EOB independently, all needs in reach. Missed 15 min of skilled PT 2/2 being unavailable.   Therapy Documentation Precautions:  Precautions Precautions: Fall Precaution Comments: ostomy, foley Restrictions Weight Bearing Restrictions: No Other Position/Activity Restrictions: Pt w/ L hemiarthoplasty 02/18/20, posterior hip precautions Vital Signs: Therapy Vitals BP: (!) 166/70 Oxygen Therapy SpO2: 95 % O2 Device: Room Air  Therapy/Group: Individual Therapy  Anthony Curry 03/27/2020, 12:30 PM

## 2020-03-27 NOTE — Progress Notes (Signed)
The chaplain responded to a consult for an AD. The patient expressed a desire to have a conversation about the AD, but wanted to wait until his wife is present. The patient does desire to complete the paperwork. The patient also talked about his deceased son and displayed some regret concerning their relationship and his death. The chaplain is available to follow-up when the wife is present and the patient is ready for a consult on the AD paperwork.  Brion Aliment Chaplain Resident For questions concerning this note please contact me by pager [phone]

## 2020-03-28 ENCOUNTER — Inpatient Hospital Stay (HOSPITAL_COMMUNITY): Payer: BLUE CROSS/BLUE SHIELD

## 2020-03-28 ENCOUNTER — Inpatient Hospital Stay (HOSPITAL_COMMUNITY): Payer: BLUE CROSS/BLUE SHIELD | Admitting: Physical Therapy

## 2020-03-28 LAB — BASIC METABOLIC PANEL
Anion gap: 8 (ref 5–15)
BUN: 22 mg/dL (ref 8–23)
CO2: 28 mmol/L (ref 22–32)
Calcium: 9 mg/dL (ref 8.9–10.3)
Chloride: 96 mmol/L — ABNORMAL LOW (ref 98–111)
Creatinine, Ser: 0.53 mg/dL — ABNORMAL LOW (ref 0.61–1.24)
GFR calc Af Amer: >60 mL/min (ref >60)
GFR calc non Af Amer: >60 mL/min (ref >60)
Glucose, Bld: 94 mg/dL (ref 70–99)
Potassium: 4.2 mmol/L (ref 3.5–5.1)
Sodium: 132 mmol/L — ABNORMAL LOW (ref 135–145)

## 2020-03-28 MED ORDER — ESCITALOPRAM OXALATE 10 MG PO TABS
10.0000 mg | ORAL_TABLET | Freq: Every day | ORAL | Status: DC
  Administered 2020-03-28 – 2020-04-01 (×5): 10 mg via ORAL
  Filled 2020-03-28 (×5): qty 1

## 2020-03-28 MED ORDER — MIRTAZAPINE 15 MG PO TABS: 15.0000 mg | ORAL_TABLET | Freq: Every day | ORAL | Status: DC

## 2020-03-28 MED ORDER — TRAZODONE HCL 50 MG PO TABS
50.0000 mg | ORAL_TABLET | Freq: Every evening | ORAL | Status: DC | PRN
  Administered 2020-03-28: 21:00:00 50 mg via ORAL
  Filled 2020-03-28: qty 1

## 2020-03-28 NOTE — Progress Notes (Signed)
Physical Therapy Session Note  Patient Details  Name: Anthony Curry. MRN: [id number] Date of Birth: August 02, 1941  Today's Date: 03/28/2020 PT Individual Time: 5916-3846 PT Individual Time Calculation (min): 45 min   Short Term Goals: Week 1:  PT Short Term Goal 1 (Week 1): =LTGs due to ELOS  Skilled Therapeutic Interventions/Progress Updates:  Pt was seen bedside in the pm. Pt performed bed mobility with S. Pt performed sit to stand transfers with S and stand pivot transfers with c/g. Pt ambulated 125 x 5, 50 and 10 feet with rollator and c/g with verbal cues. Pt requires extended rest breaks. Pt returned to room and transferred back to bed with S. Pt left sitting up in bed with family at bedside. Pt c/o increased SOB and work of breathing with ambulation, O2 sats greater than 90% when checked.   Therapy Documentation Precautions:  Precautions Precautions: Fall Precaution Comments: ostomy, foley Restrictions Weight Bearing Restrictions: No Other Position/Activity Restrictions: Pt w/ L hemiarthoplasty 02/18/20, posterior hip precautions General:   Pain: No c/o pain.    Therapy/Group: Individual Therapy  Dub Amis 03/28/2020, 3:43 PM

## 2020-03-28 NOTE — Progress Notes (Signed)
Occupational Therapy Session Note  Patient Details  Name: Anthony Curry. MRN: [id number] Date of Birth: 1941/02/22  Today's Date: 03/28/2020 OT Individual Time: 0900-1015 OT Individual Time Calculation (min): 75 min    Short Term Goals: Week 1:     Skilled Therapeutic Interventions/Progress Updates:    1:1. Pt received in bed agreeable to OT but declines bathing and dressing this date. Pt completes ambulatory transfer with rolator to stand in front of sink with S and VC for brake management for oral care and shaving. Total A to propel w/c to dayroom for time management. Pt completes 1x4.5 min nustep on workload 4 for cardio conditioning with all 4 limbs, 2 min rest and 1x3.5 min Nustep on workload 5 with calming acoustic music in background per pt preference. Pt completes functional mobility back to rooom with rolator and 2 seated rest breaks at supervision level with min Vc for safety awareness/focus on rolator brake managmenet. Pt manages ostomy with set up emptying into container with no VC, but increased frustration around task in general. Pt endorses suring sessions feelings of depression and hopelessness. Recommended Neuropsych at eval as pt would benefit from coping strategies with prognosis and CLOF. Exited session with pt seated in bed, exit alarm on and call light in reach  Therapy Documentation Precautions:  Precautions Precautions: Fall Precaution Comments: ostomy, foley Restrictions Weight Bearing Restrictions: No Other Position/Activity Restrictions: Pt w/ L hemiarthoplasty 02/18/20, posterior hip precautions General:   Vital Signs: Therapy Vitals Pulse Rate: 89 Resp: 19 Oxygen Therapy SpO2: 94 % O2 Device: Room Air Pain:   ADL:   Vision   Perception    Praxis   Exercises:   Other Treatments:     Therapy/Group: Individual Therapy  [name] 03/28/2020, 9:42 AM

## 2020-03-28 NOTE — Progress Notes (Signed)
[place] PHYSICAL MEDICINE & REHABILITATION PROGRESS NOTE   Subjective/Complaints:  Appreciate palliative consult  Pt aggravated with colostomy bag which is leaking, just changed yesterday , nursing in room to replace  Pt feels anxious depressed and would like something that starts working more quickly  ROS:occ cough, no CP, no SOB, - abd pain   Objective:   DG CHEST PORT 1 VIEW  Result Date: 03/26/2020 CLINICAL DATA:  Shortness of breath. EXAM: PORTABLE CHEST 1 VIEW COMPARISON:  Prior chest radiograph 02/24/2020, chest CT 01/23/2020 FINDINGS: Heart size within normal limits. Aortic atherosclerosis. Redemonstrated 3.1 cm right upper lobe pulmonary nodule. Please refer to prior chest CT 01/23/2020 for description of additional pulmonary nodules. Redemonstrated emphysema with scarring and architectural distortion. Ill-defined opacity at the bilateral lung bases may reflect atelectasis. Pneumonia cannot be excluded. On the left lateral costophrenic angle is not entirely included in the field of view. Within this limitation, no sizable pleural effusion is evident. No evidence of pneumothorax. Old right-sided rib fracture deformities. Chronic deformity of the proximal right humerus. IMPRESSION: Ill-defined opacity at the bilateral lung bases may reflect atelectasis. Pneumonia cannot be definitively excluded. Consider radiographic follow-up. Redemonstrated 3.1 cm right upper lobe pulmonary nodule in this patient with known pulmonary malignancy. Background pulmonary emphysema with scarring and architectural distortion. Aortic atherosclerosis. Electronically Signed   By: Kellie Simmering DO   On: 03/26/2020 14:17   Recent Labs    03/26/20 0709  WBC 10.4  HGB 13.3  HCT 39.5  PLT 469*   Recent Labs    03/27/20 0645 03/28/20 0455  NA 133* 132*  K 4.0 4.2  CL 99 96*  CO2 25 28  GLUCOSE 82 94  BUN 20 22  CREATININE 0.59* 0.53*  CALCIUM 9.0 9.0    Intake/Output Summary (Last 24 hours) at  03/28/2020 0831 Last data filed at 03/28/2020 0539 Gross per 24 hour  Intake 180 ml  Output 1500 ml  Net -1320 ml     Physical Exam: Vital Signs Blood pressure (!) 159/74, pulse 67, temperature 98.4 F (36.9 C), resp. rate 17, weight 58.9 kg, SpO2 97 %.  General: No acute distress Mood and affect are appropriate Heart: Regular rate and rhythm no rubs murmurs or extra sounds Lungs: Clear to auscultation, breathing unlabored, no rales or wheezes Abdomen: Positive bowel sounds, soft nontender to palpation, nondistended Extremities: No clubbing, cyanosis, or edema Skin: No evidence of breakdown, no evidence of rash  Neurological: He is alert.  Motor: BUE: 4+/5 throughout RLE: 4/5 proximally, 5/5 ADF LLE: HF, KE 4/5, ADF 5/5  Skin: Skin is warm and dry.     Assessment/Plan: 1. Functional deficits secondary to debility which require 3+ hours per day of interdisciplinary therapy in a comprehensive inpatient rehab setting.  Physiatrist is providing close team supervision and 24 hour management of active medical problems listed below.  Physiatrist and rehab team continue to assess barriers to discharge/monitor patient progress toward functional and medical goals  Care Tool:  Bathing    Body parts bathed by patient: Right arm, Left arm, Chest, Abdomen, Front perineal area, Right upper leg, Left upper leg, Right lower leg, Left lower leg, Face   Body parts bathed by helper: Buttocks     Bathing assist Assist Level: Minimal Assistance - Patient > 75%     Upper Body Dressing/Undressing Upper body dressing   What is the patient wearing?: Pull over shirt    Upper body assist Assist Level: Supervision/Verbal cueing    Lower  Body Dressing/Undressing Lower body dressing      What is the patient wearing?: Underwear/pull up, Pants     Lower body assist Assist for lower body dressing: Moderate Assistance - Patient 50 - 74%     Toileting Toileting Toileting Activity did not  occur Landscape architect and hygiene only): (ostomy/foley)  Toileting assist Assist for toileting: Supervision/Verbal cueing     Transfers Chair/bed transfer  Transfers assist     Chair/bed transfer assist level: Contact Guard/Touching assist     Locomotion Ambulation   Ambulation assist      Assist level: Contact Guard/Touching assist Assistive device: Rollator Max distance: 125'   Walk 10 feet activity   Assist     Assist level: Contact Guard/Touching assist Assistive device: Rollator   Walk 50 feet activity   Assist    Assist level: Contact Guard/Touching assist Assistive device: Rollator    Walk 150 feet activity   Assist Walk 150 feet activity did not occur: Safety/medical concerns         Walk 10 feet on uneven surface  activity   Assist Walk 10 feet on uneven surfaces activity did not occur: Safety/medical concerns         Wheelchair     Assist     Wheelchair activity did not occur: N/A         Wheelchair 50 feet with 2 turns activity    Assist    Wheelchair 50 feet with 2 turns activity did not occur: N/A       Wheelchair 150 feet activity     Assist  Wheelchair 150 feet activity did not occur: N/A       Blood pressure (!) 159/74, pulse 67, temperature 98.4 F (36.9 C), resp. rate 17, weight 58.9 kg, SpO2 97 %.    Medical Problem List and Plan: 1. Shuffling gait with poor safety awareness, poor endurance, weakness, DOE secondary to debility.Stage 4 Lung CA             -patient may shower             -ELOS/Goals: 5-9 days/Supervision/Mod I             -continue therapies. Encouraged patient that his stay should be short. Also told him that we would be checking his oxygen saturation in room and  When he's up with therapies 2.  Antithrombotics: -DVT/anticoagulation:  Pharmaceutical: Other (comment)--Eliquis             -antiplatelet therapy: N/A  3. Rib fracturs/back pain/Pain Management: Vicodin prn  for pain.              Monitor with increased exertion.  4. Mood: LCSW to follow for evaluation and support.               -antipsychotic agents: N/A  -APPRECIATE palliative care consult  -neuropsych consulted for mood assessment and recs  -after discussion with pharmacy will try low dose remeron tonight, 7.5 mg. Can overlap a few days with fluconazole.   -add low dose xanax prn for anxiety 5. Neuropsych: This patient is capable of making decisions on his own behalf. Feels quite depressed poor appetite , sleep, ruminating  Neuropsych to see next week Switch remoron to Lexapro for quicker action, would consider ritalin as antidepressant but may contribute to poor appetite     6. Skin/Wound Care: Routine pressure relief measures. Added ensure and boost for supplement as appetite is poor but wants supplements 7. Fluids/Electrolytes/Nutrition: very poor appetite  4/23 -will begin megace bid (pt agreed)   -K+ up to 4.0, continue kdur 8. Hypoxia/DOE/Pnemonitis: Multifactorial due to Covid PNA, lung cancer, pericardial effusion and pneumonitis. Completed course of antibiotics 4/20.   Prednisone taper  4/23 cxr reviewed from yesterday with patient.  CXR demonstrates ?atelectasis, RUL nodule, baseline scarring.   -continue mucinex dm, prn robitussin   -will schedule his nebs   -oxygen as needed   -anxiety playing a big role on xanax  9. Stage IV Lung cancer s/p XRT: Chemo on hold: To follow up on outpatient basis.  10. Hyponatremia: Has been in 129-130 range. Asymptomatic. Continue demeclocycline bid             4/22-23 Sodium holding at 133. Continue demeclocycline    -serial BMET's while on lexapro 11. Candida UTI and esophageal/oral thrush:   4/23 Continue diflucan through monday 12. Left hip hemi: THR, precautions for 6 weeks. No squats or twisting indefinitely.  13. Gout flare left knee: On colchicine with improvement of symptoms.  14. Insomnia: remeron scheduled and prn xanax 15.  Dysphonia: may be secondary to esophageal thrush: continue flomax thru monday    LOS: 3 days A FACE TO FACE EVALUATION WAS PERFORMED  Charlett Blake 03/28/2020, 8:31 AM

## 2020-03-28 NOTE — Progress Notes (Addendum)
Palliative Medicine Inpatient Follow Up Note   HPI: Patient is an60 year old male with history of chronic A. 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 Patient presented to ED on 4/9 with a fall of from his wheelchair.   Palliative care has been asked to aid in goals of care conversations. Patient has been chronically declining and has ongoing issues with fatigue, poor appetite and dizziness. Patient lost balance and fell without losing consciousness.  Today's Discussion (03/28/2020): Chart reviewed. Went to see patient, he was up with occupational therapy brushing his teeth. Plan to go back later.   Per review of notes has remained aggravated with ostomy. I went ahead and ordered another consult for the ostomy team to come around and help with ongoing education. Lexparo started per primary though it will take some time to see a valuable effect.   Discussed the importance of continued conversation with family and their  medical providers regarding overall plan of care and treatment options, ensuring decisions are within the context of the patients values and GOCs.  Questions and concerns addressed   Vital Signs Vitals:   03/28/20 0536 03/28/20 0925  BP: (!) 159/74   Pulse: 67 89  Resp: 17 19  Temp: 98.4 F (36.9 C)   SpO2: 97% 94%    Intake/Output Summary (Last 24 hours) at 03/28/2020 1009 Last data filed at 03/28/2020 0900 Gross per 24 hour  Intake 300 ml  Output 1900 ml  Net -1600 ml   Last Weight  Most recent update: 03/28/2020  5:53 AM   Weight  58.9 kg (129 lb 14.4 oz)           SUMMARY OF RECOMMENDATIONS   DNAR/DNI  Plan to complete Advance Directives  Plan to complete MOST form  OP Palliative Care Referral  Code Status/Advance Care Planning:  DNR  Symptom Management:  Failure to  Thrive:                 - Dietary consult, on BOOST supplements                 - Allow patient to choose the time he would like to eat                 - OOB for all meals                 - Megace started per primary  Anxiety/Depression:                 - Lexapro started per primary                 - Patient would benefit from OP CBT  Insomnia:                 - Agree with trazodone will change to '50mg'$  standing QHS                 - Minimize interruptions                 - Patient has alprazolam ordered though he states he would prefer not to use this  Generalized Pain:                 - Norco Q4H PRN  Cough:                 - Mucinex  Q12H  Muscular Debility:                 - In CIR                 - Daily PT/OT  Spiritual: - Chaplain consult  Wound/Ostomy:  - Consult for ostomy team to help with education and management recommendations  Need for emotional support:                 - Utilize therapeutic listening                 - Ensure safe space to express concerns                 - Sit next to patient/family making eye contact  Polypharmacy:                 - Patient requests that we minimize medication burden as much as possible  Time Spent: 35 Greater than 50% of the time was spent in counseling and coordination of care ______________________________________________________________________________________ Addendum: I met with Anthony Curry for about forty minutes this late morning. He shared with me his fears about the colostomy bag and foley catheter. He vocalized that he does not want to be dependant on the help of others moving forward. He shared that he cannot fathom another six months of the ostomy. I offered reassurance that he was able to care for the ostomy on his own he will just need good ongoing education on how to do so. He said that he realizes that other people perceive him as "weak" given his present health state. I tried to offer  guidance on how to work through these feelings. We talked about the benefit of ongoing counseling which in his case would prove to be very helpful. He shared with me that he can't stop worrying. I did reassure him that the lexapro should start to have an affect in about two weeks. He said he wishes there was a "magic pill" that could stop his worries. I will ask chaplaincy to stop by to offer support this afternoon.   We reviewed the MOST form again in detail, he shared that he will look it over again today and sign it tomorrow.   I offered for him to call me if I can help in any other way or to just listen to him.   Additional time spent: [place] Team Team Cell Phone: [phone] Please utilize secure chat with additional questions, if there is no response within 30 minutes please call the above phone number  Palliative Medicine Team providers are available by phone from 7am to 7pm daily and can be reached through the team cell phone.  Should this patient require assistance outside of these hours, please call the patient's attending physician.

## 2020-03-29 ENCOUNTER — Inpatient Hospital Stay (HOSPITAL_COMMUNITY): Payer: BLUE CROSS/BLUE SHIELD | Admitting: Physical Therapy

## 2020-03-29 ENCOUNTER — Inpatient Hospital Stay (HOSPITAL_COMMUNITY): Payer: BLUE CROSS/BLUE SHIELD | Admitting: Occupational Therapy

## 2020-03-29 MED ORDER — IPRATROPIUM-ALBUTEROL 0.5-2.5 (3) MG/3ML IN SOLN
3.0000 mL | RESPIRATORY_TRACT | Status: DC | PRN
  Administered 2020-03-29: 14:00:00 3 mL via RESPIRATORY_TRACT
  Filled 2020-03-29: qty 3

## 2020-03-29 MED ORDER — QUETIAPINE FUMARATE 25 MG PO TABS
12.5000 mg | ORAL_TABLET | Freq: Every day | ORAL | Status: DC
  Administered 2020-03-29: 20:00:00 12.5 mg via ORAL
  Filled 2020-03-29: qty 1

## 2020-03-29 MED ORDER — TRAZODONE HCL 50 MG PO TABS
100.0000 mg | ORAL_TABLET | Freq: Every day | ORAL | Status: DC
  Administered 2020-03-29 – 2020-03-31 (×3): 100 mg via ORAL
  Filled 2020-03-29 (×3): qty 2

## 2020-03-29 NOTE — Progress Notes (Signed)
Physical Therapy Session Note  Patient Details  Name: [name]. MRN: [id number] Date of Birth: 12-Mar-1941  Today's Date: 03/29/2020 PT Individual Time: 0908-1008 PT Individual Time Calculation (min): 60 min   Short Term Goals: Week 1:  PT Short Term Goal 1 (Week 1): =LTGs due to ELOS  Skilled Therapeutic Interventions/Progress Updates:  Pt was seen bedside in the am. Pt's biggest complaint he didn't sleep well last night but willing to participate with therapy as tolerated. Pt performed bed mobility with S. Pt performed sit to stand transfers with S and stand pivot transfers with c/g. Pt ambulated 125 feet x 2 and 50 feet x 2 with rollator and c/g. Pt performed step taps for LE strengthening. Pt performed 5 sets x 10 reps each and 3 sets x 15 reps each. O2 greater than 95% throughout treatment. HR at rest 90 to 100 and increased up to 120 with activity. Pt returned to room following treatment and left sitting up in bed with all needs within reach.   Therapy Documentation Precautions:  Precautions Precautions: Fall Precaution Comments: ostomy, foley Restrictions Weight Bearing Restrictions: No Other Position/Activity Restrictions: Pt w/ L hemiarthoplasty 02/18/20, posterior hip precautions General:   Vital Signs:  Pain: No c/o pain.   Therapy/Group: Individual Therapy  Dub Amis 03/29/2020, 12:18 PM

## 2020-03-29 NOTE — Progress Notes (Signed)
Occupational Therapy Session Note  Patient Details  Name: Anthony Curry. MRN: [id number] Date of Birth: Apr 09, 1941  Today's Date: 03/29/2020 OT Individual Time: 1430-1535 OT Individual Time Calculation (min): 65 min   Skilled Therapeutic Interventions/Progress Updates:    Pt greeted in bed with family at bedside. He requested to shower. Supine<sit completed unassisted with HOB elevated. He needed vcs to not bend down to retrieve his shoes from floor for hip precaution adherence. Pt then ambulated into the bathroom using rollator with supervision assist, vcs for device mgt and locking brakes at appropriate times. OT covered IV and also his ostomy bag per RN request (they had just changed the bag). Pt then bathed at sit<stand level from TTB, min vcs for sequencing and vcs for using LH sponge to wash his Lt foot vs flex hip to meet demands of task. Pt also frequently dropped his red loofah sponge and needed cuing to allow OT to retrieve sponge vs bending forward to get it himself. He then dressed at sit<stand level from edge of TTB using rollator for standing support. He needed Max A to thread LB garments on the Lt side due to hip precautions, Max A also for threading foley. Pt was able to pull underwear and pants up in standing with supervision for balance. Footwear donned with assist for the Lt side as well. Afterwards pt ambulated back to bed using rollator and returned to supine with vcs to remove his shoes first. Pt remained in bed with all needs within reach, bed alarm set, and family still present. Throughout session, he reported feeling very, very tired.   OT provided pt with lavender scented cotton balls for his pillowcase to help with sleep. Per pt, he has trouble sleeping because he is "worrying about everything."  Pt will also benefit from reacher training once this device is back in stock.   Therapy Documentation Precautions:  Precautions Precautions: Fall Precaution Comments:  ostomy, foley Restrictions Weight Bearing Restrictions: No Other Position/Activity Restrictions: Pt w/ L hemiarthoplasty 02/18/20, posterior hip precautions Vital Signs: Therapy Vitals Temp: 98.1 F (36.7 C) Pulse Rate: 73 Resp: 16 BP: (!) 155/50 Patient Position (if appropriate): Lying Oxygen Therapy SpO2: 100 % O2 Device: Room Air Pain: Pt denied having pain during session    ADL:     Therapy/Group: Individual Therapy  Stpehanie Montroy A Alizon Schmeling 03/29/2020, 4:02 PM

## 2020-03-29 NOTE — Progress Notes (Signed)
Palliative Medicine Inpatient Follow Up Note   HPI: Patient is an76 year old male with history of chronic A. 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 Patient presented to ED on 4/9 with a fall of from his wheelchair.   Palliative care has been asked to aid in goals of care conversations. Patient has been chronically declining and has ongoing issues with fatigue, poor appetite and dizziness. Patient lost balance and fell without losing consciousness.  Today's Discussion (03/29/2020): Chart reviewed. Went to see patient, he was finishing breakfast. Remains to have a flat affect. He shares that he was up all night worried about "everything". I allowed him to express his worries. I shared that the ostomy nurse will be coming by today to provide education this should ideally make him feel more able to participate in caring for his ostomy. We again discussed that antidepressants take time to have the desired effect. He shares that he has "no energy" to do anything. I offered him support. Emphasized that he and his body have been through a tremendous amount.   Regarding advanced care planning we completed the MOST form today. A copy was placed in his chart.   Discussed the importance of continued conversation with family and their  medical providers regarding overall plan of care and treatment options, ensuring decisions are within the context of the patients values and GOCs.  Questions and concerns addressed   Vital Signs Vitals:   03/28/20 2200 03/29/20 0517  BP:  (!) 157/63  Pulse:  71  Resp:  18  Temp:  98.1 F (36.7 C)  SpO2: 98% 96%    Intake/Output Summary (Last 24 hours) at 03/29/2020 3267 Last data filed at 03/29/2020 0532 Gross per 24 hour  Intake 240 ml  Output 2525 ml  Net -2285 ml   Last Weight  Most  recent update: 03/29/2020  5:31 AM   Weight  58.3 kg (128 lb 8.5 oz)           SUMMARY OF RECOMMENDATIONS   DNAR/DNI  Plan to complete Advance Directives tomorrow with Chaplain  MOST form completed --> Placed in chart  OP Palliative Care Referral  Code Status/Advance Care Planning:  DNR  Symptom Management:  Failure to Thrive:                 - Dietary consult, changed to Ensure supplements                 - Allow patient to choose the time he would like to eat                 - OOB for all meals                 - Megace started per primary  Anxiety/Depression:                 - Lexapro started per primary                 - Patient would benefit from OP CBT  Insomnia:                 - Agree with trazodone will increase to 100mg  standing QHS  - Agree with consideration of seroquel                 - Minimize interruptions                 -  Patient has alprazolam ordered though he states he would prefer not to use this  Generalized Pain:                 - Norco Q4H PRN  Cough:                 - Mucinex Q12H  Muscular Debility:                 - In CIR                 - Daily PT/OT  Spiritual: - Chaplain consult  Wound/Ostomy:  - Consult for ostomy team to help with education and management recommendations  Need for emotional support:                 - Utilize therapeutic listening                 - Ensure safe space to express concerns                 - Sit next to patient/family making eye contact  Polypharmacy:                 - Patient requests that we minimize medication burden as much as possible  Time Spent: 35 Greater than 50% of the time was spent in counseling and coordination of care ______________________________________________________________________________________ [place] Team Team Cell Phone: [phone] Please utilize secure chat with additional questions, if there is  no response within 30 minutes please call the above phone number  Palliative Medicine Team providers are available by phone from 7am to 7pm daily and can be reached through the team cell phone.  Should this patient require assistance outside of these hours, please call the patient's attending physician.

## 2020-03-29 NOTE — Progress Notes (Signed)
[place] PHYSICAL MEDICINE & REHABILITATION PROGRESS NOTE   Subjective/Complaints:  Appreciate palliative consult  Pt aggravated with colostomy bag which is leaking, just changed yesterday , nursing in room to replace  Pt feels anxious depressed and would like something that starts working more quickly  ROS:occ cough, no CP, no SOB, - abd pain   Objective:   No results found. Recent Labs    03/26/20 0709  WBC 10.4  HGB 13.3  HCT 39.5  PLT 469*   Recent Labs    03/27/20 0645 03/28/20 0455  NA 133* 132*  K 4.0 4.2  CL 99 96*  CO2 25 28  GLUCOSE 82 94  BUN 20 22  CREATININE 0.59* 0.53*  CALCIUM 9.0 9.0    Intake/Output Summary (Last 24 hours) at 03/29/2020 0656 Last data filed at 03/29/2020 0532 Gross per 24 hour  Intake 360 ml  Output 2525 ml  Net -2165 ml     Physical Exam: Vital Signs Blood pressure (!) 157/63, pulse 71, temperature 98.1 F (36.7 C), resp. rate 18, weight 58.3 kg, SpO2 96 %.  General: No acute distress Mood and affect are appropriate Heart: Regular rate and rhythm no rubs murmurs or extra sounds Lungs: Clear to auscultation, breathing unlabored, no rales or wheezes Abdomen: Positive bowel sounds, soft nontender to palpation, nondistended Extremities: No clubbing, cyanosis, or edema Skin: No evidence of breakdown, no evidence of rash  Neurological: He is alert.  Motor: BUE: 4+/5 throughout RLE: 4/5 proximally, 5/5 ADF LLE: HF, KE 4/5, ADF 5/5  Skin: Skin is warm and dry.     Assessment/Plan: 1. Functional deficits secondary to debility which require 3+ hours per day of interdisciplinary therapy in a comprehensive inpatient rehab setting.  Physiatrist is providing close team supervision and 24 hour management of active medical problems listed below.  Physiatrist and rehab team continue to assess barriers to discharge/monitor patient progress toward functional and medical goals  Care Tool:  Bathing    Body parts bathed by  patient: Right arm, Left arm, Chest, Abdomen, Front perineal area, Right upper leg, Left upper leg, Right lower leg, Left lower leg, Face   Body parts bathed by helper: Buttocks     Bathing assist Assist Level: Minimal Assistance - Patient > 75%     Upper Body Dressing/Undressing Upper body dressing   What is the patient wearing?: Pull over shirt    Upper body assist Assist Level: Supervision/Verbal cueing    Lower Body Dressing/Undressing Lower body dressing      What is the patient wearing?: Underwear/pull up, Pants     Lower body assist Assist for lower body dressing: Moderate Assistance - Patient 50 - 74%     Toileting Toileting Toileting Activity did not occur Landscape architect and hygiene only): (ostomy/foley)  Toileting assist Assist for toileting: Supervision/Verbal cueing     Transfers Chair/bed transfer  Transfers assist     Chair/bed transfer assist level: Contact Guard/Touching assist     Locomotion Ambulation   Ambulation assist      Assist level: Contact Guard/Touching assist Assistive device: Rollator Max distance: 125   Walk 10 feet activity   Assist     Assist level: Contact Guard/Touching assist Assistive device: Rollator   Walk 50 feet activity   Assist    Assist level: Contact Guard/Touching assist Assistive device: Rollator    Walk 150 feet activity   Assist Walk 150 feet activity did not occur: Safety/medical concerns  Walk 10 feet on uneven surface  activity   Assist Walk 10 feet on uneven surfaces activity did not occur: Safety/medical concerns         Wheelchair     Assist     Wheelchair activity did not occur: N/A         Wheelchair 50 feet with 2 turns activity    Assist    Wheelchair 50 feet with 2 turns activity did not occur: N/A       Wheelchair 150 feet activity     Assist  Wheelchair 150 feet activity did not occur: N/A       Blood pressure (!) 157/63,  pulse 71, temperature 98.1 F (36.7 C), resp. rate 18, weight 58.3 kg, SpO2 96 %.    Medical Problem List and Plan: 1. Shuffling gait with poor safety awareness, poor endurance, weakness, DOE secondary to debility.Stage 4 Lung CA             -patient may shower             -ELOS/Goals: 5-9 days/Supervision/Mod I             -continue therapies. Encouraged patient that his stay should be short. Also told him that we would be checking his oxygen saturation in room and  When he's up with therapies 2.  Antithrombotics: -DVT/anticoagulation:  Pharmaceutical: Other (comment)--Eliquis             -antiplatelet therapy: N/A  3. Rib fracturs/back pain/Pain Management: Vicodin prn for pain.  Recent FNF with L hemiarthroplasty in March 2021             Monitor with increased exertion.  4. Mood: LCSW to follow for evaluation and support.               -antipsychotic agents: N/A  -APPRECIATE palliative care consult  -neuropsych consulted for mood assessment and recs  -after discussion with pharmacy will try low dose remeron tonight, 7.5 mg. Can overlap a few days with fluconazole.   -add low dose xanax prn for anxiety 5. Neuropsych: This patient is capable of making decisions on his own behalf. Feels quite depressed poor appetite , sleep, ruminating  Neuropsych to see next week Switch remoron to Lexapro for quicker action, would consider ritalin as antidepressant but may contribute to poor appetite     6. Skin/Wound Care: Routine pressure relief measures. Added ensure and boost for supplement as appetite is poor but wants supplements 7. Fluids/Electrolytes/Nutrition: very poor appetite  4/23 -will begin megace bid (pt agreed)   -K+ up to 4.0, continue kdur 8. Hypoxia/DOE/Pnemonitis: Multifactorial due to Covid PNA, lung cancer, pericardial effusion and pneumonitis. Completed course of antibiotics 4/20.   Prednisone taper  4/23 cxr reviewed from yesterday with patient.  CXR demonstrates  ?atelectasis, RUL nodule, baseline scarring.   -continue mucinex dm, prn robitussin   -will schedule his nebs   -oxygen as needed   -anxiety playing a big role on xanax  9. Stage IV Lung cancer s/p XRT: Chemo on hold: To follow up on outpatient basis.  10. Hyponatremia: Has been in 129-130 range. Asymptomatic. Continue demeclocycline bid             4/22-23 Sodium holding at 133. Continue demeclocycline    -serial BMET's while on lexapro 11. Candida UTI and esophageal/oral thrush:   4/23 Continue diflucan through monday 12. Left hip hemi: THR, precautions for 6 weeks. No squats or twisting indefinitely.  13. Gout flare left  knee: On colchicine with improvement of symptoms.  14. Insomnia: off remeron, feels Xanax did not work, has not tried  Seroquel , has tried Ambien in past but would wake up at 3 am  Trial seroquel 12.5 mg qhs  15. Dysphonia: may be secondary to esophageal thrush: continue diflucan  thru monday    LOS: 4 days A FACE TO FACE EVALUATION WAS PERFORMED  Charlett Blake 03/29/2020, 6:56 AM

## 2020-03-30 ENCOUNTER — Inpatient Hospital Stay (HOSPITAL_COMMUNITY): Payer: BLUE CROSS/BLUE SHIELD | Admitting: Occupational Therapy

## 2020-03-30 ENCOUNTER — Inpatient Hospital Stay (HOSPITAL_COMMUNITY): Payer: BLUE CROSS/BLUE SHIELD

## 2020-03-30 ENCOUNTER — Encounter (HOSPITAL_COMMUNITY): Payer: BLUE CROSS/BLUE SHIELD | Admitting: Psychology

## 2020-03-30 DIAGNOSIS — E43 Unspecified severe protein-calorie malnutrition: Secondary | ICD-10-CM | POA: Insufficient documentation

## 2020-03-30 LAB — BASIC METABOLIC PANEL
Anion gap: 6 (ref 5–15)
BUN: 29 mg/dL — ABNORMAL HIGH (ref 8–23)
CO2: 26 mmol/L (ref 22–32)
Calcium: 8.9 mg/dL (ref 8.9–10.3)
Chloride: 97 mmol/L — ABNORMAL LOW (ref 98–111)
Creatinine, Ser: 0.62 mg/dL (ref 0.61–1.24)
GFR calc Af Amer: >60 mL/min (ref >60)
GFR calc non Af Amer: >60 mL/min (ref >60)
Glucose, Bld: 93 mg/dL (ref 70–99)
Potassium: 4.6 mmol/L (ref 3.5–5.1)
Sodium: 129 mmol/L — ABNORMAL LOW (ref 135–145)

## 2020-03-30 LAB — CBC
HCT: 37.1 % — ABNORMAL LOW (ref 39.0–52.0)
Hemoglobin: 12.5 g/dL — ABNORMAL LOW (ref 13.0–17.0)
MCH: 29 pg (ref 26.0–34.0)
MCHC: 33.7 g/dL (ref 30.0–36.0)
MCV: 86.1 fL (ref 80.0–100.0)
Platelets: 388 10*3/uL (ref 150–400)
RBC: 4.31 MIL/uL (ref 4.22–5.81)
RDW: 15.4 % (ref 11.5–15.5)
WBC: 15.4 10*3/uL — ABNORMAL HIGH (ref 4.0–10.5)
nRBC: 0 % (ref 0.0–0.2)

## 2020-03-30 MED ORDER — MELATONIN 3 MG PO TABS
3.0000 mg | ORAL_TABLET | Freq: Every day | ORAL | Status: DC
  Administered 2020-03-30 – 2020-03-31 (×2): 3 mg via ORAL
  Filled 2020-03-30 (×2): qty 1

## 2020-03-30 MED ORDER — QUETIAPINE FUMARATE 25 MG PO TABS
25.0000 mg | ORAL_TABLET | Freq: Every day | ORAL | Status: DC
  Administered 2020-03-30 – 2020-03-31 (×2): 25 mg via ORAL
  Filled 2020-03-30 (×2): qty 1

## 2020-03-30 MED ORDER — COLCHICINE 0.6 MG PO TABS
0.6000 mg | ORAL_TABLET | Freq: Every day | ORAL | Status: DC
  Administered 2020-03-31 – 2020-04-01 (×2): 0.6 mg via ORAL
  Filled 2020-03-30 (×2): qty 1

## 2020-03-30 NOTE — Consult Note (Signed)
[place] Nurse wound follow up Patient receiving care in [facility] 4M11.  All necessary ostomy supplies in room. Today I answered all associated ostomy questions to the expressed satisfaction of the patient.  He was able to empty his pouch independently.  The pouch shows no signs of leakage.  He verbalized his understanding of the use of stoma powder and skin prep pads for crusting for irritated skin, and barrier rings to facilitate a flat surface.  He agrees to perform all the steps he possibly can the next time the pouch needs changing.  During his questions related to cleaning around the stoma I found out that he and his wife had been using baby wipes.  I explained these have emollients that prevent the barrier from sticking to the skin.  I reviewed with him the ONLY thing used to clean around the stoma is water on a soft cloth, nothing more.  And, not to apply any types of creams, lotions, ointments, etc as these will prevent sticking also. [place] nurse will check on patient additional times this week to continue teaching. Val Riles, RN, MSN, CWOCN, CNS-BC, pager [phone]

## 2020-03-30 NOTE — Plan of Care (Signed)
  Problem: RH SAFETY Goal: RH STG ADHERE TO SAFETY PRECAUTIONS W/ASSISTANCE/DEVICE Description: STG Adhere to Safety Precautions independently   Outcome: Progressing    Problem: RH BLADDER ELIMINATION Goal: RH STG MANAGE BLADDER WITH ASSISTANCE Description: STG Manage Bladder With Assistance Outcome: Progressing Goal: RH STG MANAGE BLADDER WITH EQUIPMENT WITH ASSISTANCE Description: STG Manage Bladder With Equipment With Assistance Outcome: Progressing   Problem: RH SKIN INTEGRITY Goal: RH STG SKIN FREE OF INFECTION/BREAKDOWN Outcome: Progressing

## 2020-03-30 NOTE — Progress Notes (Signed)
Physical Therapy Session Note  Patient Details  Name: Anthony Curry. MRN: [id number] Date of Birth: 11/20/1941  Today's Date: 03/30/2020 PT Individual Time: 1100-1150 PT Individual Time Calculation (min): 50 min   Short Term Goals: Week 1:  PT Short Term Goal 1 (Week 1): =LTGs due to ELOS  Skilled Therapeutic Interventions/Progress Updates:    Pt presents in bed and reports being very fatigued. Reports staff and MD tell him he is sleeping but he doesn't feel like he is at night time. Dscussed overall goals and ELOS, with upcoming conference tomorrow. Pt reports he worried about his colostomy and being independent with that. Denies concerns with mobility at this time except for always feeling SOB and exhausted. Education on importance of continued OOB and mobility despite fatigue. Pt verbalized understanding but declines OOB at end of session for lunch. Performed bed mobility with supervision and extra time with [facility] elevated (his elevated at home per report) for both supine <> sit. Pt requires supervision for sit <> stands with rollator demonstrating safe use of brakes. Occasional CGA during transfers with turns and during gait due to fatigue and to stabilize RW when using it for seated rest break. Otherwise overall supervision. Pt declined practicing car transfer or curb step negotiation today in anticipation of upcoming d/c due to feeling like the car is "no issue" and "won't have to do a curb." Performs bouts of gait with rollator for functional mobility and generalized activity tolerance/endurance about 100' and 60' with seated breaks needed due to fatigue x 3-4 each time. Vitals remained WFL on room air. Balance retraining on airex pad for ankle strategies and to decrease fall risk x 3 reps with decreased UE support requiring up to min assist for balance recovery. Standing heel and toe raises x 10 reps each x 2 sets total with rest breaks between each due to fatigue.   Anticipate patient is  ready for d/c in the next day or 2 from a mobility standpoint if medically cleared and rest of team agrees.    Therapy Documentation Precautions:  Precautions Precautions: Fall Precaution Comments: ostomy, foley Restrictions Weight Bearing Restrictions: No Other Position/Activity Restrictions: Pt w/ L hemiarthoplasty 02/18/20, posterior hip precautions   Vital Signs: HR = 93-105 bpm with activity O2 = 94-98% on room air with activity Pain: Pain Assessment Pain Scale: 0-10 Pain Score: 0-No pain    Therapy/Group: Individual Therapy  Canary Brim Ivory Broad, PT, DPT, CBIS  03/30/2020, 12:22 PM

## 2020-03-30 NOTE — Plan of Care (Signed)
  Problem: RH SKIN INTEGRITY Goal: RH STG SKIN FREE OF INFECTION/BREAKDOWN Description: Min assist  Outcome: Progressing Goal: RH STG MAINTAIN SKIN INTEGRITY WITH ASSISTANCE Description: STG Maintain Skin Integrity With min assist  Outcome: Progressing   Problem: RH SAFETY Goal: RH STG ADHERE TO SAFETY PRECAUTIONS W/ASSISTANCE/DEVICE Description: STG Adhere to Safety Precautions independently   Outcome: Progressing   Problem: RH KNOWLEDGE DEFICIT GENERAL Goal: RH STG INCREASE KNOWLEDGE OF SELF CARE AFTER HOSPITALIZATION Outcome: Progressing   Problem: Consults Goal: RH GENERAL PATIENT EDUCATION Description: See Patient Education module for education specifics. Outcome: Progressing Goal: Skin Care Protocol Initiated - if Braden Score 18 or less Description: If consults are not indicated, leave blank or document N/A Outcome: Progressing Goal: Nutrition Consult-if indicated Outcome: Progressing Goal: Diabetes Guidelines if Diabetic/Glucose > 140 Description: If diabetic or lab glucose is > 140 mg/dl - Initiate Diabetes/Hyperglycemia Guidelines & Document Interventions  Outcome: Progressing   Problem: RH BOWEL ELIMINATION Goal: RH STG MANAGE BOWEL WITH ASSISTANCE Description: STG Manage Bowel with Assistance. Outcome: Progressing Goal: RH STG MANAGE BOWEL W/MEDICATION W/ASSISTANCE Description: STG Manage Bowel with Medication with Assistance. Outcome: Progressing   Problem: RH BLADDER ELIMINATION Goal: RH STG MANAGE BLADDER WITH ASSISTANCE Description: STG Manage Bladder With Assistance Outcome: Progressing Goal: RH STG MANAGE BLADDER WITH EQUIPMENT WITH ASSISTANCE Description: STG Manage Bladder With Equipment With Assistance Outcome: Progressing   Problem: RH SKIN INTEGRITY Goal: RH STG SKIN FREE OF INFECTION/BREAKDOWN Outcome: Progressing   Problem: RH SAFETY Goal: RH STG ADHERE TO SAFETY PRECAUTIONS W/ASSISTANCE/DEVICE Description: STG Adhere to Safety  Precautions With Assistance/Device. Outcome: Progressing   Problem: RH PAIN MANAGEMENT Goal: RH STG PAIN MANAGED AT OR BELOW PT'S PAIN GOAL Outcome: Progressing   Problem: RH KNOWLEDGE DEFICIT GENERAL Goal: RH STG INCREASE KNOWLEDGE OF SELF CARE AFTER HOSPITALIZATION Outcome: Progressing   Problem: RH Pre-functional/Other (Specify) Goal: RH LTG Pre-functional (Specify) Outcome: Progressing Goal: RH LTG Interdisciplinary (Specify) 1 Description: RH LTG Interdisciplinary (Specify)1 Outcome: Progressing Goal: RH LTG Interdisciplinary (Specify) 2 Description: RH LTG Interdisciplinary (Specify) 2  Outcome: Progressing

## 2020-03-30 NOTE — Progress Notes (Addendum)
Social Work Patient ID: [name]., male   DOB: 05-05-1941, 79 y.o.   MRN: [id number]   Pt is active for HHPT/SN/SW services with [facility]. Pt will need resumption orders.   SW provided pt with aide/attendant form. Pt admitted that he was confused and not able to understand what SW was explaining. Pt asked SW to call his dtr.  SW left packet for pt. SW left message for pt dtr [name] [phone]) at pt request.  SW spoke with pt wife [name] [phone]) to inform on aide and attendant form left with pt and to take form to [facility] office. SW also informed on who to follow-up with in regard to additional benefits. SW received return phone call from pt dtr. [name]. Sw explained above. Intends to follow-up with pt mother to discuss further.    Loralee Pacas, MSW, [place] Office: [phone] Cell: [phone] Fax: [phone]

## 2020-03-30 NOTE — Consult Note (Signed)
[place] Nurse ostomy consult note Met with patient at bedside he is anxious and depressed. Has lots of questions related to his ostomy care.  Previously when surgery performed patient did not participate in care, now he is very depressed over quality of life with stoma. Spent over an hour with patient reinforcing needs and teaching patient steps to be independent.  Reinforced that patient must speak up and participate in all aspects of care and not to just let the staff and his wife perform care. Patient reviewed steps with me several times.  Patient with question related emptying and I provided further instructions on how and when to empty. Spoke with bedside nurse to encourage staff to allow patient to participate in care. Mentioned to patient that OT also can help patient with independence.  Stoma type/location: LLQ, end colostomy Stomal assessment/size: 1" slightly oval shaped, minimally budded  Peristomal assessment: slight redness noted from o'clock to o'clock  Treatment options for stomal/peristomal skin: 2" barrier ring and 1/2 of 2" barrier ring from 12-6 o'clock to flatten dip in abdominal topography Output pasty brown Ostomy pouching: 1pc.flex convext   Education provided:  Reviewed stoma characteristics (budded, flush, color, texture, care) Demonstrated and allowed patient to cut new pouch;pouch change (cutting new skin barrier, measuring stoma, cleaning peristomal skin and stoma, use of barrier ring) Cut viewing window away to allow for patient to see stoma and how to place pouch on the stoma.  Education on emptying when 1/3 to 1/2 full and how to empty; allowed patient to open and close pouch 7-8 times; he did not have pouch closed when I arrived.   Demonstrated use of wick to clean spout: reviewed this with patient 2-3 times.  Discussed bathing, diet, gas, medication use, constipation  Answered patient/family questions:   Spent 75 minutes with patient to attempt to reinforce ostomy  teaching and care.  Offered emotional support and encouragement. Extra supplies ordered for patient room 4 flex convex and 4 barrier rings.  Reminded patient he needs to advocate for his own care and not to rely on the staff to perform all care.    Enrolled patient in [place] program: Yes, previously.   [place] team will follow along with you.  [place], [place], [place]

## 2020-03-30 NOTE — Progress Notes (Signed)
[place] PHYSICAL MEDICINE & REHABILITATION PROGRESS NOTE   Subjective/Complaints:  Pt says he slept somewhat but had vivid dreams and "wasn't sure I was asleep". Awoke in the middle of the night regardless and had a hard time settling down again. Asked about colostomy bag and when he should empty it.   ROS: Patient denies fever, rash, sore throat, blurred vision, nausea, vomiting, diarrhea,   shortness of breath or chest pain    Objective:   No results found. Recent Labs    03/30/20 0638  WBC 15.4*  HGB 12.5*  HCT 37.1*  PLT 388   Recent Labs    03/28/20 0455 03/30/20 0638  NA 132* 129*  K 4.2 4.6  CL 96* 97*  CO2 28 26  GLUCOSE 94 93  BUN 22 29*  CREATININE 0.53* 0.62  CALCIUM 9.0 8.9    Intake/Output Summary (Last 24 hours) at 03/30/2020 1023 Last data filed at 03/30/2020 0530 Gross per 24 hour  Intake 600 ml  Output 1800 ml  Net -1200 ml     Physical Exam: Vital Signs Blood pressure (!) 131/51, pulse 62, temperature 97.9 F (36.6 C), resp. rate 17, weight 60.8 kg, SpO2 98 %.  Constitutional: No distress . Vital signs reviewed. HEENT: EOMI, oral membranes moist Neck: supple Cardiovascular: RRR without murmur. No JVD    Respiratory/Chest: occasional productive cough, otherwise clear    GI/Abdomen: BS +, non-tender, non-distended, ostomy bag Ext: no clubbing, cyanosis, or edema Psych: depressed and irritable Neurological: He is alert.  Motor: BUE: 4+/5 throughout RLE: 4/5 proximally, 5/5 ADF LLE: HF, KE 4/5, ADF 5/5  Skin: Skin is warm and dry.     Assessment/Plan: 1. Functional deficits secondary to debility which require 3+ hours per day of interdisciplinary therapy in a comprehensive inpatient rehab setting.  Physiatrist is providing close team supervision and 24 hour management of active medical problems listed below.  Physiatrist and rehab team continue to assess barriers to discharge/monitor patient progress toward functional and medical  goals  Care Tool:  Bathing    Body parts bathed by patient: Right arm, Left arm, Chest, Abdomen, Front perineal area, Right upper leg, Left upper leg, Right lower leg, Left lower leg, Face, Buttocks   Body parts bathed by helper: Buttocks     Bathing assist Assist Level: Supervision/Verbal cueing(using LH sponge)     Upper Body Dressing/Undressing Upper body dressing   What is the patient wearing?: Pull over shirt    Upper body assist Assist Level: Set up assist    Lower Body Dressing/Undressing Lower body dressing      What is the patient wearing?: Underwear/pull up, Pants     Lower body assist Assist for lower body dressing: Moderate Assistance - Patient 50 - 74%     Toileting Toileting Toileting Activity did not occur Landscape architect and hygiene only): (ostomy/foley)  Toileting assist Assist for toileting: Supervision/Verbal cueing     Transfers Chair/bed transfer  Transfers assist     Chair/bed transfer assist level: Contact Guard/Touching assist     Locomotion Ambulation   Ambulation assist      Assist level: Contact Guard/Touching assist Assistive device: Rollator Max distance: 125   Walk 10 feet activity   Assist     Assist level: Contact Guard/Touching assist Assistive device: Rollator   Walk 50 feet activity   Assist    Assist level: Contact Guard/Touching assist Assistive device: Rollator    Walk 150 feet activity   Assist Walk 150 feet  activity did not occur: Safety/medical concerns         Walk 10 feet on uneven surface  activity   Assist Walk 10 feet on uneven surfaces activity did not occur: Safety/medical Armed forces technical officer activity did not occur: N/A         Wheelchair 50 feet with 2 turns activity    Assist    Wheelchair 50 feet with 2 turns activity did not occur: N/A       Wheelchair 150 feet activity     Assist  Wheelchair 150 feet  activity did not occur: N/A       Blood pressure (!) 131/51, pulse 62, temperature 97.9 F (36.6 C), resp. rate 17, weight 60.8 kg, SpO2 98 %.    Medical Problem List and Plan: 1. Shuffling gait with poor safety awareness, poor endurance, weakness, DOE secondary to debility.Stage 4 Lung CA             -patient may shower             -ELOS/Goals: 5-9 days/Supervision/Mod I             -team conf tomorrow 2.  Antithrombotics: -DVT/anticoagulation:  Pharmaceutical: Other (comment)--Eliquis             -antiplatelet therapy: N/A  3. Rib fracturs/back pain/Pain Management: Vicodin prn for pain.  Recent FNF with L hemiarthroplasty in March 2021             Monitor with increased exertion.  4. Mood: LCSW to follow for evaluation and support.               -antipsychotic agents: N/A  -APPRECIATE palliative care consult  -neuropsych consulted for mood assessment and recs   -continue lexapro (stop diflucan today)  -increase seroquel to 25mg  qhs for sleep, add melatonin also   -add low dose xanax prn for anxiety 5. Neuropsych: This patient is capable of making decisions on his own behalf.   -reviewed pt with Dr. Sima Matas, appreciate and agree with thoughts      6. Skin/Wound Care: Routine pressure relief measures. Added ensure and boost for supplement as appetite is poor but wants supplements 7. Fluids/Electrolytes/Nutrition: very poor appetite  4/23 -will begin megace bid (pt agreed)   -K+ up to 4.0, continue kdur 8. Hypoxia/DOE/Pnemonitis: Multifactorial due to Covid PNA, lung cancer, pericardial effusion and pneumonitis. Completed course of antibiotics 4/20.   Prednisone taper  4/23 cxr reviewed from yesterday with patient.  CXR demonstrates ?atelectasis, RUL nodule, baseline scarring.   -continue mucinex dm, prn robitussin   -will schedule his nebs   -oxygen as needed   -anxiety playing a big role-on xanax  9. Stage IV Lung cancer s/p XRT: Chemo on hold: To follow up on outpatient  basis.  10. Hyponatremia: Has been in 129-130 range. Asymptomatic. Continue demeclocycline bid             4/27--Na+ 129 today   -continue lexapro'   -continue demeclocyline    -recheck tomorro 11. Candida UTI and esophageal/oral thrush:   4/23 Continue diflucan through monday 12. Left hip hemi: THR, precautions for 6 weeks. No squats or twisting indefinitely.  13. Gout flare left knee: On colchicine with improvement of symptoms.  15. Dysphonia: may be secondary to esophageal thrush: continue diflucan  thru today   LOS: 5 days A FACE TO FACE EVALUATION WAS PERFORMED  Meredith Staggers 03/30/2020, 10:23 AM

## 2020-03-30 NOTE — Progress Notes (Signed)
Occupational Therapy Session Note  Patient Details  Name: Anthony Curry. MRN: [id number] Date of Birth: [birth year]  Today's Date: 03/30/2020 OT Individual Time: 1000-1040 OT Individual Time Calculation (min): 40 min   Skilled Therapeutic Interventions/Progress Updates:    Pt greeted in bed, upset about not sleeping last night, however RN stated in report pt slept through the night. Per pt, he doesn't remember, attributes his significant fatigue to not sleeping. He reported his bed is adjustable at home but does not have bedrails. Pt completed supine<sit without bedrail use with setup assist. He needed vcs to not flex the Lt hip when donning shoes EOB for hip precaution adherence. Pt wanted to drink an Ensure. Notified RN due to fluid restrictions and she okay'd this. He transferred to an armless chair in room with vcs for locking rollator brakes and controlled lower into chair vs plopping down. Pt then drank his Ensure, c/o that he can't eat at CIR because he's not hungry at scheduled meal times. OT educated pt that he can save meal items for later if he notifies staff to assist him with this. Pt then completed a few grooming tasks including oral care while sitting at the sink, vcs and assistance for setting up rollator safely to do this. He completed these tasks while seated, taking rest breaks between each task due to fatigue. Pt then transferred back to bed. Left him with all needs within reach and bed alarm set. Tx focus placed on OOB tolerance, activity tolerance, transfers, and ADL retraining.   Therapy Documentation Precautions:  Precautions Precautions: Fall Precaution Comments: ostomy, foley Restrictions Weight Bearing Restrictions: No Other Position/Activity Restrictions: Pt w/ L hemiarthoplasty 02/18/20, posterior hip precautions Pain: pt denied pain during tx Pain Assessment Pain Scale: 0-10 Pain Score: 0-No pain ADL:   :     Therapy/Group: Individual Therapy  [name]  [name] 03/30/2020, 12:26 PM

## 2020-03-30 NOTE — Consult Note (Signed)
Neuropsychological Consultation   Patient:   Anthony Curry.   DOB:   Jan 01, 1941  MR Number:  [id number]  Location:  [facility] [id number] [place] [address] Dept: [place]: [phone]           Date of Service:   03/30/2020  Start Time:   9 AM End Time:   10 AM  Provider/Observer:  Ilean Skill, Psy.D.       Clinical Neuropsychologist       Billing Code/Service: [phone]  Chief Complaint:    Anthony Curry is an 79 year old male with history of stage IV lung cancer pericardial effusion, A. fib, chronic hyponatremia, multiple recent admissions for perforated diverticulitis requiring colostomy.  During recent hospitalization patient found to have COVID-19 pneumonia with complicated hospital course.  Patient has had numerous medical issues that can be found in his HPI.  Patient has been referred to comprehensive rehabilitation program due to functional decline.  Patient extremely frustrated about colostomy, poor sleep quality and fatigue and resulting reduced cognitive efficiency likely due to poor sleep and fatigue/medical issues and debility.  Reason for Service:  The patient was referred for neuropsychological consultation due to coping and adjustment issues.  Below is the HPI for the current admission.  HPI: Anthony Curry is an 79 year old male with history of stage IV lung cancer s/p XRT, pericardial effusion, A fib-on Eliquis, chronic hyponatremia, multiple recent admissions for perforated diverticulitis requiring sigmoid colectomy with colostomy, found to have Covid 19 PNA and hospital course complicated by urinary retention, left hand ischemia requiring left brachial embolectomy. History taken from chart review, wife, and patient, due to patient's agitation with current situation. He was readmitted on 02/17/20 with left femoral neck fracture s/p left hip hemi and was discharged  to SNF for therapy but patient elected to go home next day. He ws admitted again on 03/13/20 after a fall, hitting his head with LOC and subsequent dizziness, cough, and weakness. He was found to have worsening of hyponatremia with Na-124 and was started on gentle IV hydration. Demeclocyline added--Remeron d/ced to prevent further drop.  CT chest showed increased pericardial effusion with new right 4,6,7 and 8th rib fractures, increase in left lung base effusion with air space disease question due to aspiration pneumonitis as well as RLL changes question post traumatic v/s disease worsening.   Repeat 2D echo showing moderate/large pleural effusion --slightly larger compared to 01/08/20 and no tamponade physiology. Dr. Margaretann Loveless consulted for input and felt that persistent effusion secondary to metastatic lung CA and no significant benefit with intervention and potential risks as no tamponade noted. Hem/Onc consulted for input 03/17/20 and felt that X rays without definite disease progression --->patient started on Augmentin X 7 days due to concerns of aspiration/immune mediated pneumonitis as well as steroids with recommendations for slow taper after 1-2 weeks. Hospital course further complicated by gout flare left knee with effusion--refused tap, candida UTI with dysuria, urinary retention--foley changed out on 04/16, hypoxia with activity as well as back/hip pain. He continues to have anxiety as well as tachycardia with HR up to 120's activity, shuffling gait with poor safety awareness, needs verbal and tactile cues to follow commands and requires multiple rest breaks with cues for pursed breathing due to recover from significant dyspnea with activity. CIR recommended due to functional decline. Please see preadmission from earlier today as well.   Current Status:  The patient is  very frustrated and focused on issues related to his colostomy bag, poor sleep and severe fatigue.  The patient has anxiety around fear  that his lung cancer is progressing without being able to have his maintenance chemotherapy doses each month due to his numerous medical issues and complications.  The patient was well oriented today but was distractible primarily from internal preoccupations.  He was oriented x4.  Behavioral Observation: Anthony Curry.  presents as a 79 y.o.-year-old Right Caucasian Male who appeared his stated age. his dress was Appropriate and he was Well Groomed and his manners were Appropriate to the situation.  his participation was indicative of Appropriate and Redirectable behaviors.  There were physical disabilities noted.  he displayed an appropriate level of cooperation and motivation.     Interactions:    Active Appropriate and Redirectable  Attention:   within normal limits and the patient was distracted by some of his internal preoccupations and fatigue.  Memory:   within normal limits; recent and remote memory intact  Visuo-spatial:  not examined  Speech (Volume):  low  Speech:   normal; normal  Thought Process:  Coherent and Relevant  Though Content:  WNL; not suicidal and not homicidal although the patient did have some suggestion of wondering why he should continue with such struggles in all of these limitations and the patient has been having palliative care consultation.  Orientation:   person, place, time/date and situation  Judgment:   Fair  Planning:   Fair  Affect:    Blunted, Depressed, Irritable and Lethargic  Mood:    Dysphoric  Insight:   Fair  Intelligence:   high  Medical History:   Past Medical History:  Diagnosis Date  . 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 Adrenal mass ([place])   . Alcoholism in remission ([place])   . Anemia of chronic disease   . Ankle fracture    right  . 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 Anxiety   . Atrial fibrillation, chronic ([place])   . BPH (benign prostatic hyperplasia)   . Cancer ([place])    small cell/lung  .  Complication of anesthesia 09/08/2015    JITTERY AFTER GALLBLADDER SURGERY  . COVID-19 12/2019  . Depression   . Epididymoorchitis 01/30/2020   with right hydrocele  . Femur fracture ([place])   . GERD (gastroesophageal reflux disease)   . History of radiation therapy 11/14/16, 11/18/16, 11/22/16   Left upper lung: 54 Gy in 3 fractions  . Hypertension   . Hyponatremia   . Multiple falls   . Neoplastic malignant related fatigue    IV tx. every 2 weeks last9-14-16 (Dr. Earlie Server), radiation last tx. 6 months  . 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 Perforated diverticulum   . Pericardial effusion   . Persistent atrial fibrillation ([place])   . Protein calorie malnutrition ([place])   . Pulmonary nodules   . Radiation 12/02/14-12/16/14   Left adrenal gland 30 Gy in 10 fractions  . Stage 4 lung cancer, unspecified laterality ([place])   . Urinary retention    Psychiatric History:  Patient does have a history of anxiety and depression which have been exacerbated by his extended hospital stay and numerous medical issues  Family Med/Psych History:  Family History  Problem Relation Age of Onset  . Lung cancer Mother   . Heart attack Father   . Brain cancer Brother   . Hypertension Father   . Diabetes Paternal Grandmother   . Heart attack Brother   .  Neuropathy Neg Hx      Impression/DX:  Anthony Curry is an 79 year old male with history of stage IV lung cancer pericardial effusion, A. fib, chronic hyponatremia, multiple recent admissions for perforated diverticulitis requiring colostomy.  During recent hospitalization patient found to have COVID-19 pneumonia with complicated hospital course.  Patient has had numerous medical issues that can be found in his HPI.  Patient has been referred to comprehensive rehabilitation program due to functional decline.  Patient extremely frustrated about colostomy, poor sleep quality and fatigue and resulting reduced cognitive efficiency likely due to poor  sleep and fatigue/medical issues and debility.  The patient is very frustrated and focused on issues related to his colostomy bag, poor sleep and severe fatigue.  The patient has anxiety around fear that his lung cancer is progressing without being able to have his maintenance chemotherapy doses each month due to his numerous medical issues and complications.  The patient was well oriented today but was distractible primarily from internal preoccupations.  He was oriented x4.  Disposition/Plan:  Today we worked on coping and adjustment the patient reviewed issues and concerns that he has about his medical status and future expectations.  I will follow up with the patient later this week.  Diagnosis:    Pneumonitis - Plan: DISCONTINUED: predniSONE (DELTASONE) tablet 50 mg  SOB (shortness of breath) - Plan: DG CHEST PORT 1 VIEW, DG CHEST PORT 1 VIEW         Electronically Signed   _______________________ Ilean Skill, Psy.D.

## 2020-03-30 NOTE — Progress Notes (Signed)
Occupational Therapy Session Note  Patient Details  Name: Anthony Curry. MRN: [id number] Date of Birth: 13-Jun-1941  Today's Date: 03/30/2020 OT Individual Time: 4961-1643 OT Individual Time Calculation (min): 57 min    Short Term Goals: Week 1: STG=LTG 2/2 ELOS  Skilled Therapeutic Interventions/Progress Updates:  Patient met lying supine in bed in agreement with OT treatment session with focus on functional transfers, functional mobility with use of RW, and dynamic standing balance in prep for completion of ADLs. Supine to EOB with supevision A and HOB elevated and sit to stand from EOB with supervision A. Functional mobility to therapy gym with supervision-CGA, 2 seated rest breaks and vc's for safety with rollator. Patient engaged in dynamic standing balance activity standing on blue foam mat with incorporation of memory with pipe tree. Patient able to recall and recreate shape of pipe tree without image in front of him. Patient notes increased anxiety with standing without rollator in front of him. Therapeutic exercise with green theraband for 1 set x8-10 reps each. Patient returned to room with use of rollator and 2 seated rest breaks. Session concluded with patient lying supine in bed with call bell within reach, bed alarm activated, and all needs met.   Therapy Documentation Precautions:  Precautions Precautions: Fall Precaution Comments: ostomy, foley Restrictions Weight Bearing Restrictions: No Other Position/Activity Restrictions: Pt w/ L hemiarthoplasty 02/18/20, posterior hip precautions  Therapy/Group: Individual Therapy  Nickie Deren R Howerton-Davis 03/30/2020, 2:45 PM

## 2020-03-31 ENCOUNTER — Inpatient Hospital Stay (HOSPITAL_COMMUNITY): Payer: BLUE CROSS/BLUE SHIELD | Admitting: Occupational Therapy

## 2020-03-31 ENCOUNTER — Telehealth: Payer: Self-pay | Admitting: Medical Oncology

## 2020-03-31 ENCOUNTER — Inpatient Hospital Stay (HOSPITAL_COMMUNITY): Payer: BLUE CROSS/BLUE SHIELD | Admitting: Physical Therapy

## 2020-03-31 LAB — BASIC METABOLIC PANEL
Anion gap: 6 (ref 5–15)
BUN: 30 mg/dL — ABNORMAL HIGH (ref 8–23)
CO2: 27 mmol/L (ref 22–32)
Calcium: 8.9 mg/dL (ref 8.9–10.3)
Chloride: 97 mmol/L — ABNORMAL LOW (ref 98–111)
Creatinine, Ser: 0.6 mg/dL — ABNORMAL LOW (ref 0.61–1.24)
GFR calc Af Amer: >60 mL/min (ref >60)
GFR calc non Af Amer: >60 mL/min (ref >60)
Glucose, Bld: 101 mg/dL — ABNORMAL HIGH (ref 70–99)
Potassium: 4.6 mmol/L (ref 3.5–5.1)
Sodium: 130 mmol/L — ABNORMAL LOW (ref 135–145)

## 2020-03-31 MED ORDER — MAGIC MOUTHWASH: 10.0000 mL | Freq: Four times a day (QID) | ORAL | 0 refills | Status: DC

## 2020-03-31 MED ORDER — SACCHAROMYCES BOULARDII 250 MG PO CAPS: 250.0000 mg | ORAL_CAPSULE | Freq: Two times a day (BID) | ORAL | 0 refills | Status: DC

## 2020-03-31 MED ORDER — ADULT MULTIVITAMIN W/MINERALS CH: 1.0000 | ORAL_TABLET | Freq: Every day | ORAL | 0 refills | Status: DC

## 2020-03-31 MED ORDER — QUETIAPINE FUMARATE 25 MG PO TABS: 25.0000 mg | ORAL_TABLET | Freq: Every day | ORAL | 0 refills | Status: DC

## 2020-03-31 MED ORDER — DM-GUAIFENESIN ER 30-600 MG PO TB12: 1.0000 | ORAL_TABLET | Freq: Two times a day (BID) | ORAL | 0 refills | Status: DC

## 2020-03-31 MED ORDER — PREDNISONE 10 MG PO TABS: ORAL_TABLET | ORAL | 0 refills | Status: DC

## 2020-03-31 MED ORDER — MELATONIN 3 MG PO TABS: 3.0000 mg | ORAL_TABLET | Freq: Every day | ORAL | 0 refills | Status: DC

## 2020-03-31 MED ORDER — ESCITALOPRAM OXALATE 10 MG PO TABS: 10.0000 mg | ORAL_TABLET | Freq: Every day | ORAL | 0 refills | Status: DC

## 2020-03-31 MED ORDER — ALBUTEROL SULFATE HFA 108 (90 BASE) MCG/ACT IN AERS: 1.0000 | INHALATION_SPRAY | Freq: Four times a day (QID) | RESPIRATORY_TRACT | 0 refills | Status: DC | PRN

## 2020-03-31 MED ORDER — TRAZODONE HCL 100 MG PO TABS: 100.0000 mg | ORAL_TABLET | Freq: Every day | ORAL | 0 refills | Status: DC

## 2020-03-31 MED ORDER — POTASSIUM CHLORIDE CRYS ER 20 MEQ PO TBCR: 20.0000 meq | EXTENDED_RELEASE_TABLET | Freq: Two times a day (BID) | ORAL | 0 refills | Status: DC

## 2020-03-31 MED ORDER — TAMSULOSIN HCL 0.4 MG PO CAPS: 0.4000 mg | ORAL_CAPSULE | Freq: Two times a day (BID) | ORAL | 0 refills | Status: DC

## 2020-03-31 MED ORDER — DEMECLOCYCLINE HCL 150 MG PO TABS: 300.0000 mg | ORAL_TABLET | Freq: Two times a day (BID) | ORAL | 0 refills | Status: DC

## 2020-03-31 MED ORDER — MEGESTROL ACETATE 400 MG/10ML PO SUSP: 400.0000 mg | Freq: Two times a day (BID) | ORAL | 0 refills | Status: DC

## 2020-03-31 MED ORDER — LIDOCAINE 5 % EX PTCH: 1.0000 | MEDICATED_PATCH | CUTANEOUS | 0 refills | Status: DC

## 2020-03-31 MED ORDER — LEVOTHYROXINE SODIUM 50 MCG PO TABS: 50.0000 ug | ORAL_TABLET | Freq: Every day | ORAL | 0 refills | Status: DC

## 2020-03-31 MED ORDER — APIXABAN 5 MG PO TABS: 5.0000 mg | ORAL_TABLET | Freq: Two times a day (BID) | ORAL | 0 refills | Status: DC

## 2020-03-31 MED ORDER — COLCHICINE 0.6 MG PO TABS: 0.6000 mg | ORAL_TABLET | Freq: Every day | ORAL | 0 refills | Status: DC

## 2020-03-31 MED ORDER — ALPRAZOLAM 0.25 MG PO TABS: 0.2500 mg | ORAL_TABLET | Freq: Two times a day (BID) | ORAL | 0 refills | Status: DC | PRN

## 2020-03-31 MED ORDER — DILTIAZEM HCL ER COATED BEADS 180 MG PO CP24: 180.0000 mg | ORAL_CAPSULE | Freq: Every day | ORAL | 0 refills | Status: DC

## 2020-03-31 MED FILL — predniSONE 10 MG TABS: 10 | 10 days supply | Qty: 21 | Fill #0

## 2020-03-31 MED FILL — ESCITALOPRAM 10 MG TABLET: 10 | 30 days supply | Qty: 30 | Fill #0

## 2020-03-31 NOTE — Progress Notes (Signed)
Social Work Patient ID: Anthony T Vasko Jr., male   DOB: 01/04/1941, 78 y.o.   MRN: [id number]    Per medical team, pt can d/c tomorrow. No DME recommendations. Pt already active with [facility]. SW informed Anthony Curry/[facility] on pt needs: HHPT/OT/CNA/RN (colostomy edu/stage II sacram wound monitoring)/SW.   SW spoke with  [facility] (p:[phone]/cell:[phone]) to inform on pt d/c tomorrow and request for aide services. Reports will submit aide referral. SW informed current agency is [facility]. Requests for d/c summary to be faxed to pt PCP.  [facility] PCP- Anthony Curry P:[phone] O: [phone] (Attn RN Anthony Curry)/fax d/c summary and [facility] meds  [phone].   SW met with pt in room to inform on pt discharge to home tomorrow. SW provided updates on above with regard to DME and HH.  *Per OT, pt needs TTB. SW submitted order to [facility] via parachute.   SW spoke with pt wife Anthony Curry ([phone]) to discuss above and added DME. Confirms that transport w/c has been received. If TTB not covered under insurance, SW will [facility] to [facility].     , MSW, LCSWA Office: [phone] Cell: [phone] Fax: [phone] 

## 2020-03-31 NOTE — Plan of Care (Signed)
  Problem: RH SKIN INTEGRITY Goal: RH STG SKIN FREE OF INFECTION/BREAKDOWN Description: Min assist  Outcome: Progressing Goal: RH STG MAINTAIN SKIN INTEGRITY WITH ASSISTANCE Description: STG Maintain Skin Integrity With min assist  Outcome: Progressing   Problem: RH SAFETY Goal: RH STG ADHERE TO SAFETY PRECAUTIONS W/ASSISTANCE/DEVICE Description: STG Adhere to Safety Precautions independently   Outcome: Progressing

## 2020-03-31 NOTE — Consult Note (Signed)
[facility] Nurse ostomy follow up Attempted to see patient, however, Chaplain in room.  Will try back at 11:30. Val Riles, RN, MSN, CWOCN, CNS-BC, pager [phone]

## 2020-03-31 NOTE — Telephone Encounter (Signed)
Pt still in hospital. He cancelled appts for tomorrow. I told him to call us when he is discharged from the hospital.

## 2020-03-31 NOTE — Progress Notes (Addendum)
Occupational Therapy Session Note  Patient Details  Name: Anthony Curry. MRN: [id number] Date of Birth: [birth year]  Today's Date: 03/31/2020 OT Individual Time: 0915-1000 OT Individual Time Calculation (min): 45 min    Short Term Goals: Week 1:  OT Short Term Goal 1 (Week 1): STG=LTG 2/2 ELOS  Skilled Therapeutic Interventions/Progress Updates:  Session 1: Patient met lying supine in bed in agreement with OT treatment session with focus on household mobility, activity tolerance, and energy conservation as detailed below. Supine to EOB with supervision A and HOB elevated. Patient noted need for emptying of ostomy bag requiring set-up assist from NT. Sit to stand from EOB at lowest setting with CGA and functional mobility to sink with rollator and close supervision A. Patient able to stand with rollator positioned behind to initiate grooming/oral hygiene tasks at sink level with LUE supported on sink for stability. Patient required rest breaks after bout of 1:45sec and after bout of 2:45sec. Patient ambulated from room to and from nurses station with one seated RB. C/o diziness with ambulation, vitals monitored all WNL. Session concluded with patient seated in recliner with call bell within reach, chair alarm activated, and all needs met.   Therapy Documentation Precautions:  Precautions Precautions: Fall Precaution Comments: ostomy, foley Restrictions Weight Bearing Restrictions: No Other Position/Activity Restrictions: Pt w/ L hemiarthoplasty 02/18/20, posterior hip precautions  Therapy/Group: Individual Therapy  Gokul Waybright R Howerton-Davis 03/31/2020, 7:48 AM

## 2020-03-31 NOTE — Progress Notes (Addendum)
[facility] PHYSICAL MEDICINE & REHABILITATION PROGRESS NOTE   Subjective/Complaints:  Pt states he didn't sleep. "I was up at 2am". "why won't anyone let me know if i'm sleeping?" d/w RN who said he dis sleep most of night and that they had discussed with him. Remains discouraged about coughing, ostomy, etc  ROS: Limited due to  behavioral   Objective:   No results found. Recent Labs    03/30/20 0638  WBC 15.4*  HGB 12.5*  HCT 37.1*  PLT 388   Recent Labs    03/30/20 0638 03/31/20 0609  NA 129* 130*  K 4.6 4.6  CL 97* 97*  CO2 26 27  GLUCOSE 93 101*  BUN 29* 30*  CREATININE 0.62 0.60*  CALCIUM 8.9 8.9    Intake/Output Summary (Last 24 hours) at 03/31/2020 1118 Last data filed at 03/31/2020 0949 Gross per 24 hour  Intake 580 ml  Output 1550 ml  Net -970 ml     Physical Exam: Vital Signs Blood pressure (!) 155/70, pulse 71, temperature 98.4 F (36.9 C), temperature source Oral, resp. rate 18, weight 60.8 kg, SpO2 98 %.  Constitutional: No distress . Vital signs reviewed. HEENT: EOMI, oral membranes moist Neck: supple Cardiovascular: RRR without murmur. No JVD    Respiratory/Chest: CTA Bilaterally without wheezes or rales. Normal effort    GI/Abdomen: BS +, non-tender, distended- ostomy intact.  Ext: no clubbing, cyanosis, or edema Psych: depressed, ruminating, irritable Neurological: He is alert.  Motor: BUE: 4+/5 throughout RLE: 4/5 proximally, 5/5 ADF LLE: HF, KE 4/5, ADF 5/5  Skin: Skin is warm and dry.     Assessment/Plan: 1. Functional deficits secondary to debility which require 3+ hours per day of interdisciplinary therapy in a comprehensive inpatient rehab setting.  Physiatrist is providing close team supervision and 24 hour management of active medical problems listed below.  Physiatrist and rehab team continue to assess barriers to discharge/monitor patient progress toward functional and medical goals  Care Tool:  Bathing    Body parts  bathed by patient: Right arm, Left arm, Chest, Abdomen, Front perineal area, Right upper leg, Left upper leg, Right lower leg, Left lower leg, Face, Buttocks   Body parts bathed by helper: Buttocks     Bathing assist Assist Level: Supervision/Verbal cueing(using LH sponge)     Upper Body Dressing/Undressing Upper body dressing   What is the patient wearing?: Pull over shirt    Upper body assist Assist Level: Set up assist    Lower Body Dressing/Undressing Lower body dressing      What is the patient wearing?: Underwear/pull up, Pants     Lower body assist Assist for lower body dressing: Moderate Assistance - Patient 50 - 74%     Toileting Toileting Toileting Activity did not occur Landscape architect and hygiene only): (ostomy/foley)  Toileting assist Assist for toileting: Supervision/Verbal cueing(ostomy)     Transfers Chair/bed transfer  Transfers assist     Chair/bed transfer assist level: Supervision/Verbal cueing     Locomotion Ambulation   Ambulation assist      Assist level: Supervision/Verbal cueing Assistive device: Rollator Max distance: 125   Walk 10 feet activity   Assist     Assist level: Supervision/Verbal cueing Assistive device: Rollator   Walk 50 feet activity   Assist    Assist level: Contact Guard/Touching assist Assistive device: Rollator    Walk 150 feet activity   Assist Walk 150 feet activity did not occur: Safety/medical concerns  Walk 10 feet on uneven surface  activity   Assist Walk 10 feet on uneven surfaces activity did not occur: Safety/medical concerns         Wheelchair     Assist     Wheelchair activity did not occur: N/A         Wheelchair 50 feet with 2 turns activity    Assist    Wheelchair 50 feet with 2 turns activity did not occur: N/A       Wheelchair 150 feet activity     Assist  Wheelchair 150 feet activity did not occur: N/A       Blood pressure  (!) 155/70, pulse 71, temperature 98.4 F (36.9 C), temperature source Oral, resp. rate 18, weight 60.8 kg, SpO2 98 %.    Medical Problem List and Plan: 1. Shuffling gait with poor safety awareness, poor endurance, weakness, DOE secondary to debility.Stage 4 Lung CA             -patient may shower             -ELOS 4/28             -team conf today  -will need onc, surgery, palliative care follow up after discharge. I will not follow up with him.  2.  Antithrombotics: -DVT/anticoagulation:  Pharmaceutical: Other (comment)--Eliquis             -antiplatelet therapy: N/A  3. Rib fracturs/back pain/Pain Management: Vicodin prn for pain.  Recent FNF with L hemiarthroplasty in March 2021             Monitor with increased exertion.  4. Mood: LCSW to follow for evaluation and support.               -antipsychotic agents: seroquel hs  -APPRECIATE palliative care consult   -continue lexapro    -increased seroquel to 25mg  qhs for sleep with melatonin. Pt is convinced he didn't sleep well last night, (can't remember honestly)   -will order sleep chart tonight  -I expect him to sleep better and emotionally improve to an extent once out of hospital 5. Neuropsych: This patient is capable of making decisions on his own behalf.   -reviewed pt with Dr. Sima Matas, appreciate and agree with thoughts      6. Skin/Wound Care: Routine pressure relief measures. Added ensure and boost for supplement as appetite is poor but wants supplements 7. Fluids/Electrolytes/Nutrition: very poor appetite  4/23 -will begin megace bid    4/27 intake inconsistent but somewhat better with megace 8. Hypoxia/DOE/Pnemonitis: Multifactorial due to Covid PNA, lung cancer, pericardial effusion and pneumonitis. Completed course of antibiotics 4/20.   Prednisone taper  4/23 cxr reviewed from yesterday with patient.  CXR demonstrates ?atelectasis, RUL nodule, baseline scarring.   -continue mucinex dm, prn robitussin   -will  schedule his nebs   -oxygen as needed  4/27: I expressed to him that given his lung ca, pneumonia/pneumonitis that he will have an ongoing cough  9. Stage IV Lung cancer s/p XRT: Chemo on hold: To follow up on outpatient basis.  10. Hyponatremia: Has been in 129-130 range. Asymptomatic. Continue demeclocycline bid             4/27--Na+ 130 today   -continue lexapro'   -continue demeclocyline    -recheck tomorrow prior to discharge 11. Candida UTI and esophageal/oral thrush:     Continued diflucan through monday 12. Left hip hemi: THR, precautions for 6 weeks. No squats or twisting  indefinitely.  13. Gout flare left knee: On colchicine with improvement of symptoms.    -reduced to daily   LOS: 6 days A FACE TO FACE EVALUATION WAS PERFORMED  Meredith Staggers 03/31/2020, 11:18 AM

## 2020-03-31 NOTE — Consult Note (Signed)
[place] Nurse ostomy follow up Existing ostomy pouch placed 4/25.  No washout, no s/s of leaking or impending leakage.  Many questions of patient answered and many anxieties of the patient have been addressed.  The plan is to meet with the patient Wednesday morning at 0730 and have the patient perform his own pouch change.  [facility] education brochure and tip sheets provided and patient has agreed to read the materials before tomorrow morning. Val Riles, RN, MSN, CWOCN, CNS-BC, pager [phone]

## 2020-03-31 NOTE — Progress Notes (Signed)
Physical Therapy Discharge Summary  Patient Details  Name: Anthony Curry. MRN: [id number] Date of Birth: [birth year]  Today's Date: 03/31/2020 PT Individual Time: 5027-7412 PT Individual Time Calculation (min): 40 min   Pt visiting w/ chaplain upon arrival, returned to later in AM to complete session. Missed 20 min of skilled PT. Pt received in recliner and agreeable to therapy, no c/o pain. Discussed readiness for d/c, pt hesitant but in agreement that he can continue his recovery at home. Pt ambulated to/from therapy gym w/ supervision using rollator, ~125'. Pt unsafe to continue ambulating the more community distance of 150' 2/2 fatigue and endurance. Needs prolonged seated rest break to recover. Practiced ambulating up/down ramp w/ supervision and car transfer w/ supervision as well. Discussed continuing to build endurance by standing/ambulating w/ supervision at least 1x/hour during the day. Provided w/ written handout w/ this as a reminder and written handout of energy conservation strategies and tips for managing cancer-related fatigue. Pt's fatigue and decreased endurance is likely multifactorial 2/2 significant medical history and prolonged hospital stay. Ongoing education regarding this and encouragement as pt often ruminates on negative thoughts about his situation. Returned to room and ended session in recliner, all needs in reach.   Patient has met 8 of 10 long term goals due to improved activity tolerance, improved balance and increased strength.  Patient to discharge at an ambulatory level Supervision.   Patient's care partner is independent to provide the necessary physical assistance at discharge. Pt's wife has been providing supervision level assist for a few weeks/months prior to admission, no formal education necessary.   Reasons goals not met: Pt has refused to practice stair/curb negotiation as he does not need to perform this at home. Pt also remains a household ambulator 2/2  fatigue and decreased endurance. Pt ambulating 75-125' at a time.   Recommendation:  Patient will benefit from ongoing skilled PT services in home health setting to continue to advance safe functional mobility, address ongoing impairments in functional endurance, global strengthening, and balance, and minimize fall risk.  Equipment: transport chair for community mobility, pt already has rollator  Reasons for discharge: treatment goals met and discharge from hospital  Patient/family agrees with progress made and goals achieved: Yes  PT Discharge Precautions/Restrictions Precautions Precautions: Fall Precaution Comments: ostomy, foley Restrictions Weight Bearing Restrictions: No Other Position/Activity Restrictions: Pt w/ L hemiarthoplasty 02/18/20, posterior hip precautions Sensation Sensation Light Touch: Impaired by gross assessment(tingling in B distal feet, this is baseline for him) Coordination Gross Motor Movements are Fluid and Coordinated: Yes Motor  Motor Motor: Within Functional Limits Motor - Discharge Observations: generalized weakness and deconditioning  Mobility Bed Mobility Bed Mobility: Rolling Right;Rolling Left;Sit to Supine;Supine to Sit Rolling Right: Independent Rolling Left: Independent Supine to Sit: Independent Sit to Supine: Independent Transfers Transfers: Sit to Stand;Stand to Sit;Stand Pivot Transfers Sit to Stand: Supervision/Verbal cueing Stand to Sit: Supervision/Verbal cueing Stand Pivot Transfers: Supervision/Verbal cueing Stand Pivot Transfer Details: Verbal cues for precautions/safety;Verbal cues for safe use of DME/AE Transfer (Assistive device): Rollator Locomotion  Gait Ambulation: Yes Gait Assistance: Supervision/Verbal cueing Gait Distance (Feet): 125 Feet Assistive device: Rollator Gait Assistance Details: Verbal cues for safe use of DME/AE;Verbal cues for precautions/safety Gait Assistance Details: needs occasional cues for  safety w/ RW use Gait Gait: Yes Gait Pattern: Impaired Gait Pattern: Shuffle;Narrow base of support;Trunk flexed Gait velocity: decreased Stairs / Additional Locomotion Stairs: No Wheelchair Mobility Wheelchair Mobility: No  Trunk/Postural Assessment  Cervical Assessment Cervical Assessment: Exceptions to  WFL(forward head) Thoracic Assessment Thoracic Assessment: Exceptions to WFL(rounded shoulders) Lumbar Assessment Lumbar Assessment: Exceptions to WFL(posterior pelvic tilt) Postural Control Postural Control: Within Functional Limits  Balance Balance Balance Assessed: Yes Static Sitting Balance Static Sitting - Level of Assistance: 7: Independent Dynamic Sitting Balance Dynamic Sitting - Level of Assistance: 7: Independent Static Standing Balance Static Standing - Level of Assistance: 5: Stand by assistance Dynamic Standing Balance Dynamic Standing - Level of Assistance: 5: Stand by assistance Extremity Assessment  RLE Assessment RLE Assessment: Within Functional Limits Passive Range of Motion (PROM) Comments: WFL LLE Assessment LLE Assessment: Exceptions to [facility] Passive Range of Motion (PROM) Comments: Posterior hip precautions s/p L hemiarthoplasty in hip General Strength Comments: Hip musculature 4 to 4+/5, otherwise 5/5    Buford Bremer K Bashir Marchetti 03/31/2020, 12:08 PM

## 2020-03-31 NOTE — Progress Notes (Signed)
Occupational Therapy Discharge Summary  Patient Details  Name: Anthony Curry. MRN: [id number] Date of Birth: [birth year]  Today's Date: 03/31/2020 OT Individual Time: 1400-1500 OT Individual Time Calculation (min): 60 min    Patient has met 8 of 8 long term goals due to improved activity tolerance, improved balance, postural control, ability to compensate for deficits and improved awareness.  Patient to discharge at overall Supervision level for BADLs grossly including bathing at shower/sink level, dressing, and managing foley/ostomy. Patient's care partner is independent to provide the necessary physical and cognitive assistance at discharge. Patient's daughter also able to provide intermittent assistance/supervision as needed.   Reasons goals not met: N/A  Recommendation:  Patient will benefit from ongoing skilled OT services in home health setting to continue to advance functional skills in the area of BADL, iADL and Reduce care partner burden.  Equipment: No equipment provided  Reasons for discharge: treatment goals met and discharge from hospital  Patient/family agrees with progress made and goals achieved: Yes   Skilled Intervention Patient met seated in recliner in agreement with OT treatment session with focus on self-care re-education as detailed below. Supervision A for sit to stand from recliner with vc's for hand placement. Patient able to gather ADL items from wardrobe seated on rollator with supervision A. Bathing/dressing seated at sink level to conserve energy with patient able to complete with supervision A and rest breaks between tasks with cueing for use of compensatory strategies. Patient able to thread catheter through LB clothing with use of reacher and increased time/effort. Wife present throughout treatment session. Patient ambulated to EOB with use of rollator and supervision A. Wife with questions about pending d/c home tomorrow. OT answered all questions to  patient's/wife's satisfaction. Return to supine with I. Session concluded with bed alarm activated, call bell within reach and all needs met.   OT Discharge Precautions/Restrictions  Precautions Precautions: Fall Precaution Comments: ostomy, foley Restrictions Weight Bearing Restrictions: No Other Position/Activity Restrictions: Pt w/ L hemiarthoplasty 02/18/20, posterior hip precautions Pain Pain Assessment Pain Scale: 0-10 Pain Score: 0-No pain ADL ADL Eating: Independent Grooming: Supervision/safety Where Assessed-Grooming: Sitting at sink Upper Body Bathing: Supervision/safety Where Assessed-Upper Body Bathing: Sitting at sink Lower Body Bathing: Supervision/safety Where Assessed-Lower Body Bathing: Sitting at sink Upper Body Dressing: Supervision/safety Where Assessed-Upper Body Dressing: Sitting at sink Lower Body Dressing: Supervision/safety Where Assessed-Lower Body Dressing: Standing at sink, Sitting at sink Toileting: Not assessed Toilet Transfer: Unable to assess Toilet Transfer Method: Unable to assess Vision Baseline Vision/History: Wears glasses Wears Glasses: Reading only Patient Visual Report: No change from baseline Perception  Perception: Within Functional Limits Praxis Praxis: Intact Cognition Overall Cognitive Status: Impaired/Different from baseline Arousal/Alertness: Awake/alert Orientation Level: Oriented X4 Memory: Impaired Memory Impairment: Decreased short term memory Decreased Short Term Memory: Verbal complex;Functional complex Safety/Judgment: Impaired Comments: Patient requires occasional vc's for safety with rollator. Sensation Sensation Light Touch: Appears Intact Proprioception: Appears Intact Coordination Gross Motor Movements are Fluid and Coordinated: Yes Fine Motor Movements are Fluid and Coordinated: Yes Motor  Motor Motor: Within Functional Limits Motor - Discharge Observations: generalized weakness and  deconditioning Mobility  Bed Mobility Bed Mobility: Rolling Right;Rolling Left;Sit to Supine;Supine to Sit Rolling Right: Independent Rolling Left: Independent Supine to Sit: Independent Sit to Supine: Independent Transfers Sit to Stand: Supervision/Verbal cueing Stand to Sit: Supervision/Verbal cueing  Trunk/Postural Assessment  Cervical Assessment Cervical Assessment: Exceptions to WFL(forward head) Thoracic Assessment Thoracic Assessment: Exceptions to WFL(rounded shoulders) Lumbar Assessment Lumbar Assessment: Exceptions to WFL(posterior pelvic  tilt) Postural Control Postural Control: Within Functional Limits  Balance Balance Balance Assessed: Yes Static Sitting Balance Static Sitting - Level of Assistance: 7: Independent Dynamic Sitting Balance Dynamic Sitting - Level of Assistance: 7: Independent Static Standing Balance Static Standing - Level of Assistance: 5: Stand by assistance Dynamic Standing Balance Dynamic Standing - Level of Assistance: 5: Stand by assistance Extremity/Trunk Assessment RUE Assessment General Strength Comments: decreased shoulder ROM flexion and abduction   Anthony Curry 03/31/2020, 3:01 PM

## 2020-03-31 NOTE — Patient Care Conference (Signed)
Inpatient RehabilitationTeam Conference and Plan of Care Update Date: 03/31/2020   Time: 10:05 AM    Patient Name: Anthony Curry.      Medical Record Number: [id number]  Date of Birth: 1941-05-29 Sex: Male         Room/Bed: 4M11C/4M11C-01 Payor Info: Payor: MEDICARE / Plan: MEDICARE PART A AND B / Product Type: *No Product type* /    Admit Date/Time:  03/25/2020  3:52 PM  Primary Diagnosis:  Debility  Patient Active Problem List   Diagnosis Date Noted  . Protein-calorie malnutrition, severe 03/30/2020  . Palliative care by specialist   . Goals of care, counseling/discussion   . DNR (do not resuscitate)   . Debility 03/25/2020  . Pneumonitis   . Acute gout of left knee   . Candida UTI   . Supplemental oxygen dependent   . Pressure injury of skin 03/16/2020  . Hyponatremia 03/13/2020  . Fall (on)(from) sidewalk curb, initial encounter 03/13/2020  . Multiple fractures of rib involving four or more ribs 03/13/2020  . Diverticulitis 03/13/2020  . Hypothyroidism (acquired) 03/13/2020  . Stage 4 lung cancer, unspecified laterality ([place])   . Pericardial effusion   . Atrial fibrillation, chronic ([place])   . Left displaced femoral neck fracture ([place]) 02/17/2020  . Pericardial effusion 02/17/2020  . Thromboembolism ([place]) 02/17/2020  . Hip fracture ([place]) 02/17/2020  . Diverticulitis 01/02/2020  . Acute diverticulitis 01/02/2020  . Ataxia 06/30/2019  . 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 Generalized weakness 06/28/2019  . Lactic acidosis 06/28/2019  . Abnormal urinalysis 06/28/2019  . Hypertension 12/07/2016  . 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 Acute urinary retention 09/09/2015  . Choledocholithiasis with cholecystitis 09/08/2015  . Common bile duct stone   . Encounter for antineoplastic immunotherapy 06/28/2015  . Atrial fibrillation ([place]) 06/11/2015  . Gynecomastia, male 06/10/2015  . Dizziness 05/11/2015  . Malignant  neoplasm of lower lobe of right lung ([place]) 09/17/2014  . Neutropenic fever ([place]) 09/02/2014  . Neoplastic malignant related fatigue 08/13/2014  . Physical deconditioning 08/13/2014  . Hypoalbuminemia 08/13/2014  . Diarrhea 08/13/2014  . Antineoplastic chemotherapy induced anemia(285.3) 08/13/2014  . Atrial fibrillation with RVR ([place]) 07/28/2014  . HCAP (healthcare-associated pneumonia) 07/25/2014  . Alcoholism in remission ([place]) 07/25/2014  . Atrial flutter by electrocardiogram ([place]) 07/25/2014  . Swelling of limb 06/10/2014  . Lung cancer ([place]) 10/15/2013  . Hypotension 09/10/2013  . Adrenal mass ([place]) 09/09/2013  . 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 Pulmonary nodules 08/19/2013  . Anemia of chronic disease 07/03/2013  . BPH (benign prostatic hyperplasia) 07/03/2013  . GERD (gastroesophageal reflux disease) 07/03/2013  . Fall 07/03/2013  . Alcohol intoxication ([place]) 07/03/2013  . 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 Alcohol abuse 10/15/2011  . Hyponatremia 10/15/2011    Expected Discharge Date: Expected Discharge Date: 04/01/20  Team Members Present: Physician leading conference: Dr. Alger Simons Care Coodinator Present: Loralee Pacas, LCSWA;Christina Sampson Goon, BSW;Genie Glynn Yepes, RN, MSN Nurse Present: Debroah Loop, RN PT Present: Burnard Bunting, PT OT Present: Turner Daniels, OT SLP Present: Weston Anna, SLP PPS Coordinator present : Ileana Ladd, Burna Mortimer, SLP     Current Status/Progress Goal Weekly Team Focus  Bowel/Bladder   pt has a foley cath and a ostomy bag LMB 4/27  continue to mointor foley and ostomy  teach pt how to clean and and drain ostomy bag   Swallow/Nutrition/ Hydration             ADL's  Patient currently at goal level for BADLs (sup A). Patient also able to thread cather through LB clothing and empty ostomy bag with supervision A.  Arnett A  Family education, self-care re-education, activity  tolerance, compensatory and energy conservation strategies for bathing/dressing/household mobility   Mobility   supervision overall, occasional CGA for rollator management/safety, gait 100-125' at a time, on RA w/ mobility w/o issue  supervision  endurance, global strengthening, d/c planning, pt education, balance/stability   Communication             Safety/Cognition/ Behavioral Observations            Pain   pt has no c/o of pain  remain free of pain  assess qshift and prn   Skin   pt has open area where osotomy is located on stomach and stage 2 that is healed on coccyx  remain free of skin infection and breakdown  assess skin qshift and prn    Rehab Goals Patient on target to meet rehab goals: Yes *See Care Plan and progress notes for long and short-term goals.     Barriers to Discharge  Current Status/Progress Possible Resolutions Date Resolved   Nursing                  PT                    OT                  SLP                SW Decreased caregiver support;Lack of/limited family support Wife is not able to provide physical support to pt; she can only provide supervision level of care. referral will be sent to [facility] for CNA services.            Discharge Planning/Teaching Needs:  D/c to home with 24/7 care from his wife who can provide supervision level of care  Family education as recommended by therapy   Team Discussion: MD 79 yo stage 4 lung CA,debility, ain issues, depression, anxious, agitated, sleep issues, meds adjusted, cough, pneumonia, Na+ low/130, monitoring labs.  RN foley, colostomy, to teach to change colostomy, stage 2 buttocks healed over.  OT S/CGA ADLs, decreased memory, decreased safety awareness.  PT S overal, occ CGA, amb 125', on room air, at goal level.  Has a wife.   Revisions to Treatment Plan: N/A     Medical Summary Current Status: stage iv lung cancer, colostomy, left FNF hip hemiarthroplasty, significant depression/ruminating  behaviors Weekly Focus/Goal: improve functional mobility, improve restorative sleep. significant depression/anxiety  Barriers to Discharge: Behavior   Possible Resolutions to Barriers: ego support by team, neuropsych follow up, medication adjustments   Continued Need for Acute Rehabilitation Level of Care: The patient requires daily medical management by a physician with specialized training in physical medicine and rehabilitation for the following reasons: Direction of a multidisciplinary physical rehabilitation program to maximize functional independence : Yes Medical management of patient stability for increased activity during participation in an intensive rehabilitation regime.: Yes Analysis of laboratory values and/or radiology reports with any subsequent need for medication adjustment and/or medical intervention. : Yes   I attest that I was present, lead the team conference, and concur with the assessment and plan of the team.   Jodell Cipro M 04/01/2020, 2:01 PM   Team conference was held via web/ teleconference due to [place] - 19

## 2020-03-31 NOTE — Progress Notes (Signed)
I responded to consult for pastoral support. Anthony Curry was sitting up in his chair and no family present at this time. I offered him space to voice concerns and questions. He said he has had some difficulty with moving forward with his health change and needs that he will need to get used too. He showed frustration with making sense of his time in and out of the hospital the last year. Also, he said although he has support at home from his wife, his wife is also dealing with health issues. He said he wanted to share his needs to his son and grandson so they can possibly help out with his health needs when the time arises after his discharge (he is going to call them today). Anthony Curry said he is struggling with not knowing if God is hearing his prayers. As a [employer], he says he has not been active in the [employer]) but still prays. He says that he wishes that he can rest with out all the testing and appointments he has to go to. He says that he just wishes to be home and to spent time with his family in peace. There will be rehab at home, but he does not know what that would consist of.  I offered him a suggestion to let his healthcare providers know his frustration so they can see what they are able to offer concerning his wants. He voice appreciation with the Chaplain visit.   Chaplain Resident Fidel Levy [phone]

## 2020-04-01 ENCOUNTER — Ambulatory Visit: Payer: Medicare Other | Admitting: Internal Medicine

## 2020-04-01 ENCOUNTER — Other Ambulatory Visit: Payer: Medicare Other

## 2020-04-01 ENCOUNTER — Telehealth: Payer: Self-pay | Admitting: Internal Medicine

## 2020-04-01 LAB — BASIC METABOLIC PANEL
Anion gap: 7 (ref 5–15)
BUN: 29 mg/dL — ABNORMAL HIGH (ref 8–23)
CO2: 30 mmol/L (ref 22–32)
Calcium: 9.2 mg/dL (ref 8.9–10.3)
Chloride: 97 mmol/L — ABNORMAL LOW (ref 98–111)
Creatinine, Ser: 0.77 mg/dL (ref 0.61–1.24)
GFR calc Af Amer: >60 mL/min (ref >60)
GFR calc non Af Amer: >60 mL/min (ref >60)
Glucose, Bld: 119 mg/dL — ABNORMAL HIGH (ref 70–99)
Potassium: 4.3 mmol/L (ref 3.5–5.1)
Sodium: 134 mmol/L — ABNORMAL LOW (ref 135–145)

## 2020-04-01 MED ORDER — DILTIAZEM HCL ER COATED BEADS 180 MG PO CP24: 180.0000 mg | ORAL_CAPSULE | Freq: Every day | ORAL | 0 refills | Status: DC

## 2020-04-01 NOTE — Plan of Care (Signed)
  Problem: RH SKIN INTEGRITY Goal: RH STG SKIN FREE OF INFECTION/BREAKDOWN Description: Min assist  Outcome: Progressing Goal: RH STG MAINTAIN SKIN INTEGRITY WITH ASSISTANCE Description: STG Maintain Skin Integrity With min assist  Outcome: Progressing   Problem: RH SAFETY Goal: RH STG ADHERE TO SAFETY PRECAUTIONS W/ASSISTANCE/DEVICE Description: STG Adhere to Safety Precautions independently   Outcome: Progressing   Problem: RH KNOWLEDGE DEFICIT GENERAL Goal: RH STG INCREASE KNOWLEDGE OF SELF CARE AFTER HOSPITALIZATION Outcome: Progressing   Problem: Consults Goal: RH GENERAL PATIENT EDUCATION Description: See Patient Education module for education specifics. Outcome: Progressing Goal: Skin Care Protocol Initiated - if Braden Score 18 or less Description: If consults are not indicated, leave blank or document N/A Outcome: Progressing Goal: Nutrition Consult-if indicated Outcome: Progressing Goal: Diabetes Guidelines if Diabetic/Glucose > 140 Description: If diabetic or lab glucose is > 140 mg/dl - Initiate Diabetes/Hyperglycemia Guidelines & Document Interventions  Outcome: Progressing   Problem: RH BOWEL ELIMINATION Goal: RH STG MANAGE BOWEL WITH ASSISTANCE Description: STG Manage Bowel with Assistance. Outcome: Progressing Goal: RH STG MANAGE BOWEL W/MEDICATION W/ASSISTANCE Description: STG Manage Bowel with Medication with Assistance. Outcome: Progressing   Problem: RH BLADDER ELIMINATION Goal: RH STG MANAGE BLADDER WITH ASSISTANCE Description: STG Manage Bladder With Assistance Outcome: Progressing Goal: RH STG MANAGE BLADDER WITH EQUIPMENT WITH ASSISTANCE Description: STG Manage Bladder With Equipment With Assistance Outcome: Progressing   Problem: RH SKIN INTEGRITY Goal: RH STG SKIN FREE OF INFECTION/BREAKDOWN Outcome: Progressing   Problem: RH SAFETY Goal: RH STG ADHERE TO SAFETY PRECAUTIONS W/ASSISTANCE/DEVICE Description: STG Adhere to Safety  Precautions With Assistance/Device. Outcome: Progressing   Problem: RH PAIN MANAGEMENT Goal: RH STG PAIN MANAGED AT OR BELOW PT'S PAIN GOAL Outcome: Progressing   Problem: RH KNOWLEDGE DEFICIT GENERAL Goal: RH STG INCREASE KNOWLEDGE OF SELF CARE AFTER HOSPITALIZATION Outcome: Progressing   Problem: RH Pre-functional/Other (Specify) Goal: RH LTG Pre-functional (Specify) Outcome: Progressing Goal: RH LTG Interdisciplinary (Specify) 1 Description: RH LTG Interdisciplinary (Specify)1 Outcome: Progressing Goal: RH LTG Interdisciplinary (Specify) 2 Description: RH LTG Interdisciplinary (Specify) 2  Outcome: Progressing

## 2020-04-01 NOTE — Telephone Encounter (Signed)
Scheduled appt per 4/28 sch message - pt is aware of  appt date and time

## 2020-04-01 NOTE — Progress Notes (Signed)
Pt observed between 10pm and 6 am with eyes closed, lights off in room. Pt RR are even and unlabored , no signs/symptoms of distress or pain.

## 2020-04-01 NOTE — Consult Note (Signed)
[place] Nurse ostomy follow up Patient receiving care in [facility] 4M11. Stoma type/location: LUQ colostomy Stomal assessment/size: 1" slightly oval Peristomal assessment: very tiny area of skin irritation at 4 o'clock Treatment options for stomal/peristomal skin: full barrier ring and 1/2 barrier ring from 12 -6 Output : soft brown Ostomy pouching: 1pc. Convex flex Education provided: Patient removed existing pouch placed 3 days ago and cleaned skin, cut new pouch, observed placement of barrier rings, and with some guidance placed new pouch on clean skin.  Many questions answered to his expressed satisfaction. Patient expressed confidence in his ability to perform ostomy self care at home with assistance of his wife. Enrolled patient in [place] Start Discharge program: Yes previously. All ostomy supplies placed in the wheelchair he is to be discharged with along with the education folder. Val Riles, RN, MSN, CWOCN, CNS-BC, pager [phone]

## 2020-04-01 NOTE — Progress Notes (Signed)
Social Work Discharge Note   The overall goal for the admission was met for:   Discharge location: Yes. D/c to home with his wife to provide 24/7 care.  Length of Stay: Yes. 7 days.  Discharge activity level: Yes. Supervision.  Home/community participation: Yes.Limited.   Services provided included: MD, RD, PT, OT, RN, CM, TR, Pharmacy, Neuropsych and SW  Financial Services: Medicare/BCBS; Meds at [place] services arranged: Home Health: [facility] for PT/OT/RN/SW/CNA and DME: [employer]- transport w/c and showerchair with back (will either pick up or have item delivered)  Comments (or additional information): Contact pt wife [name] [phone]  Patient/Family verbalized understanding of follow-up arrangements: Yes  Individual responsible for coordination of the follow-up plan: Pt will have assistance from his wife for care needs.   Confirmed correct DME delivered: Rana Snare 04/01/2020    Rana Snare

## 2020-04-01 NOTE — Progress Notes (Signed)
Pam, PA, discussed discharge instructions with patient and his family. Patient and [place] RN changed patient's ostomy bag and supplies were delivered to patient and packed. PIV was removed and foley kept in per order from [place], [place]. Home medications delivered by pharmacy and given to patient. Patient's foley bag was converted to a leg bag per patient request and done by patient's daughter. Belongings were packed and patient discharged home with his family.

## 2020-04-01 NOTE — Discharge Instructions (Signed)
Inpatient Rehab Discharge Instructions  Anthony Curry. Discharge date and time: 04/01/20   Activities/Precautions/ Functional Status: Activity: no lifting, driving, or strenuous exercise till cleared by MD Diet: diabetic diet Wound Care: Keep wound clean and dry. Routine colostomy care   Functional status:  ___ No restrictions     ___ Walk up steps independently _X__ 24/7 supervision/assistance   ___ Walk up steps with assistance ___ Intermittent supervision/assistance  ___ Bathe/dress independently ___ Walk with walker     ___ Bathe/dress with assistance ___ Walk Independently    ___ Shower independently ___ Walk with assistance    _X__ Shower with assistance _X__ No alcohol     ___ Return to work/school ________  Special Instructions:  REFERRALS UPON DISCHARGE:    Home Health:   PT     OT       RN      SW      SNA                 Agency: [facility]  Phone: [phone] *Home health services will resume with above home health agency. Please expect follow-up within two days of your discharge to schedule your home visit. If you have not received follow-up, be sure to contact the branch directly.    Medical Equipment/Items Ordered: Consulting civil engineer. Pt has rollator.                                                  Agency/Supplier: [facility] [phone]   My questions have been answered and I understand these instructions. I will adhere to these goals and the provided educational materials after my discharge from the hospital.  Patient/Caregiver Signature _______________________________ Date __________  Clinician Signature _______________________________________ Date __________  Please bring this form and your medication list with you to all your follow-up doctor's appointments. COMMUNITY    Information on my medicine - ELIQUIS (apixaban)  This medication education was reviewed with me or my healthcare representative as part of  my discharge preparation.   Why was Eliquis prescribed for you? Eliquis was prescribed for you to reduce the risk of a blood clot forming that can cause a stroke if you have a medical condition called atrial fibrillation (a type of irregular heartbeat).  What do You need to know about Eliquis ? Take your Eliquis TWICE DAILY - one tablet in the morning and one tablet in the evening with or without food. If you have difficulty swallowing the tablet whole please discuss with your pharmacist how to take the medication safely.  Take Eliquis exactly as prescribed by your doctor and DO NOT stop taking Eliquis without talking to the doctor who prescribed the medication.  Stopping may increase your risk of developing a stroke.  Refill your prescription before you run out.  After discharge, you should have regular check-up appointments with your healthcare provider that is prescribing your Eliquis.  In the future your dose may need to be changed if your kidney function or weight changes by a significant amount or as you get older.  What do you do if you miss a dose? If you miss a dose, take it as soon as you remember on the same day and resume taking twice daily.  Do not take more than one dose of ELIQUIS at the same time  to make up a missed dose.  Important Safety Information A possible side effect of Eliquis is bleeding. You should call your healthcare provider right away if you experience any of the following: ? Bleeding from an injury or your nose that does not stop. ? Unusual colored urine (red or dark brown) or unusual colored stools (red or black). ? Unusual bruising for unknown reasons. ? A serious fall or if you hit your head (even if there is no bleeding).  Some medicines may interact with Eliquis and might increase your risk of bleeding or clotting while on Eliquis. To help avoid this, consult your healthcare provider or pharmacist prior to using any new prescription or  non-prescription medications, including herbals, vitamins, non-steroidal anti-inflammatory drugs (NSAIDs) and supplements.  This website has more information on Eliquis (apixaban): http://www.eliquis.com/eliquis/home

## 2020-04-01 NOTE — Progress Notes (Signed)
[facility] PHYSICAL MEDICINE & REHABILITATION PROGRESS NOTE   Subjective/Complaints:  Pt slept better last night. He's not aware however and thinks he was up somewhat again.   ROS limited by behavior   Objective:   No results found. Recent Labs    03/30/20 0638  WBC 15.4*  HGB 12.5*  HCT 37.1*  PLT 388   Recent Labs    03/31/20 0609 04/01/20 0658  NA 130* 134*  K 4.6 4.3  CL 97* 97*  CO2 27 30  GLUCOSE 101* 119*  BUN 30* 29*  CREATININE 0.60* 0.77  CALCIUM 8.9 9.2    Intake/Output Summary (Last 24 hours) at 04/01/2020 0816 Last data filed at 04/01/2020 0618 Gross per 24 hour  Intake 240 ml  Output 1000 ml  Net -760 ml     Physical Exam: Vital Signs Blood pressure (!) 153/67, pulse 81, temperature 97.6 F (36.4 C), temperature source Oral, resp. rate 16, weight 60.8 kg, SpO2 94 %.  Constitutional: No distress . Vital signs reviewed. HEENT: EOMI, oral membranes moist Neck: supple Cardiovascular: RRR without murmur. No JVD    Respiratory/Chest: CTA Bilaterally without wheezes or rales. Normal effort    GI/Abdomen: BS +, non-tender, distended- ostomy intact.  Ext: no clubbing, cyanosis, or edema Psych: depressed, ruminating, irritable Neurological: He is alert.  Motor: BUE: 4+/5 throughout RLE: 4/5 proximally, 5/5 ADF LLE: HF, KE 4/5, ADF 5/5  Skin: Skin is warm and dry.     Assessment/Plan: 1. Functional deficits secondary to debility which require 3+ hours per day of interdisciplinary therapy in a comprehensive inpatient rehab setting.  Physiatrist is providing close team supervision and 24 hour management of active medical problems listed below.  Physiatrist and rehab team continue to assess barriers to discharge/monitor patient progress toward functional and medical goals  Care Tool:  Bathing    Body parts bathed by patient: Right arm, Left arm, Chest, Abdomen, Front perineal area, Right upper leg, Left upper leg, Right lower leg, Left lower  leg, Face, Buttocks   Body parts bathed by helper: Buttocks     Bathing assist Assist Level: Supervision/Verbal cueing     Upper Body Dressing/Undressing Upper body dressing   What is the patient wearing?: Pull over shirt    Upper body assist Assist Level: Supervision/Verbal cueing    Lower Body Dressing/Undressing Lower body dressing      What is the patient wearing?: Underwear/pull up, Pants     Lower body assist Assist for lower body dressing: Supervision/Verbal cueing     Toileting Toileting Toileting Activity did not occur (Clothing management and hygiene only): (ostomy/foley)  Toileting assist Assist for toileting: Supervision/Verbal cueing(ostomy/foley)     Transfers Chair/bed transfer  Transfers assist     Chair/bed transfer assist level: Supervision/Verbal cueing     Locomotion Ambulation   Ambulation assist      Assist level: Supervision/Verbal cueing Assistive device: Rollator Max distance: 125'   Walk 10 feet activity   Assist     Assist level: Supervision/Verbal cueing Assistive device: Rollator   Walk 50 feet activity   Assist    Assist level: Supervision/Verbal cueing Assistive device: Rollator    Walk 150 feet activity   Assist Walk 150 feet activity did not occur: Safety/medical concerns    Assistive device: Rollator    Walk 10 feet on uneven surface  activity   Assist Walk 10 feet on uneven surfaces activity did not occur: Safety/medical concerns   Assist level: Supervision/Verbal cueing Assistive device: Rollator  Wheelchair     Assist Will patient use wheelchair at discharge?: No   Wheelchair activity did not occur: N/A         Wheelchair 50 feet with 2 turns activity    Assist    Wheelchair 50 feet with 2 turns activity did not occur: N/A       Wheelchair 150 feet activity     Assist  Wheelchair 150 feet activity did not occur: N/A       Blood pressure (!) 153/67, pulse 81,  temperature 97.6 F (36.4 C), temperature source Oral, resp. rate 16, weight 60.8 kg, SpO2 94 %.    Medical Problem List and Plan: 1. Shuffling gait with poor safety awareness, poor endurance, weakness, DOE secondary to debility.Stage 4 Lung CA             -patient may shower             -ELOS 4/28             -dc home today  -f/u with oncology/urology/ortho/primary  -he will NOT see PM&R in follow up 2.  Antithrombotics: -DVT/anticoagulation:  Pharmaceutical: Other (comment)--Eliquis             -antiplatelet therapy: N/A  3. Rib fracturs/back pain/Pain Management: Vicodin prn for pain.  Recent FNF with L hemiarthroplasty in March 2021             Monitor with increased exertion.  4. Mood: LCSW to follow for evaluation and support.               -antipsychotic agents: seroquel hs  -APPRECIATE palliative care consult   -continue lexapro    -can continue with seroquel/melatonin if he chooses at home 5. Neuropsych: This patient is capable of making decisions on his own behalf.   -reviewed pt with Dr. Sima Matas, appreciate and agree with thoughts      6. Skin/Wound Care: Routine pressure relief measures. Added ensure and boost for supplement as appetite is poor but wants supplements  -appreciate WOC RN assistance with ostomy 7. Fluids/Electrolytes/Nutrition: very poor appetite  4/23 -will begin megace bid    4/27 intake inconsistent but somewhat better with megace 8. Hypoxia/DOE/Pnemonitis: Multifactorial due to Covid PNA, lung cancer, pericardial effusion and pneumonitis. Completed course of antibiotics 4/20.   Prednisone taper  4/23 cxr reviewed from yesterday with patient.  CXR demonstrates ?atelectasis, RUL nodule, baseline scarring.   -continue mucinex dm, prn robitussin   -will schedule his nebs   -oxygen as needed  4/28: I have expressed to him that given his lung ca, pneumonia/pneumonitis that he will have an ongoing cough  9. Stage IV Lung cancer s/p XRT: Chemo on hold: To  follow up on outpatient basis.  10. Hyponatremia: Has been in 129-130 range. Asymptomatic. Continue demeclocycline bid             4/28--Na+ 134 today   -continue lexapro'   -continue demeclocyline    -follow as outpt per primary.  11. Candida UTI and esophageal/oral thrush:     Continued diflucan through Monday  -has chronic indwelling foley catheter followed by urology 12. Left hip hemi: THR, precautions for 6 weeks. No squats or twisting indefinitely.  13. Gout flare left knee: On colchicine with improvement of symptoms.    -reduced colchicine to daily   LOS: 7 days A FACE TO FACE EVALUATION WAS PERFORMED  Meredith Staggers 04/01/2020, 8:16 AM

## 2020-04-01 NOTE — Discharge Summary (Signed)
Physician Discharge Summary  Patient ID: Anthony Curry. MRN: [id number] DOB/AGE: Sep 03, 1941 79 y.o.  Admit date: 03/25/2020 Discharge date: 04/01/2020  Discharge Diagnoses:  Principal Problem:   Debility Active Problems:   Palliative care by specialist   Goals of care, counseling/discussion   DNR (do not resuscitate)   Protein-calorie malnutrition, severe   Discharged Condition: stable   Significant Diagnostic Studies:  DG CHEST PORT 1 VIEW  Result Date: 03/26/2020 CLINICAL DATA:  Shortness of breath. EXAM: PORTABLE CHEST 1 VIEW COMPARISON:  Prior chest radiograph 02/24/2020, chest CT 01/23/2020 FINDINGS: Heart size within normal limits. Aortic atherosclerosis. Redemonstrated 3.1 cm right upper lobe pulmonary nodule. Please refer to prior chest CT 01/23/2020 for description of additional pulmonary nodules. Redemonstrated emphysema with scarring and architectural distortion. Ill-defined opacity at the bilateral lung bases may reflect atelectasis. Pneumonia cannot be excluded. On the left lateral costophrenic angle is not entirely included in the field of view. Within this limitation, no sizable pleural effusion is evident. No evidence of pneumothorax. Old right-sided rib fracture deformities. Chronic deformity of the proximal right humerus. IMPRESSION: Ill-defined opacity at the bilateral lung bases may reflect atelectasis. Pneumonia cannot be definitively excluded. Consider radiographic follow-up. Redemonstrated 3.1 cm right upper lobe pulmonary nodule in this patient with known pulmonary malignancy. Background pulmonary emphysema with scarring and architectural distortion. Aortic atherosclerosis. Electronically Signed   By: Kellie Simmering DO   On: 03/26/2020 14:17    Labs:  Basic Metabolic Panel: Recent Labs  Lab 03/26/20 5462 03/27/20 0645 03/28/20 0455 03/30/20 7035 03/31/20 0609 04/01/20 0658  NA 133* 133* 132* 129* 130* 134*  K 3.3* 4.0 4.2 4.6 4.6 4.3  CL 95* 99 96* 97*  97* 97*  CO2 28 25 28 26 27 30   GLUCOSE 95 82 94 93 101* 119*  BUN 19 20 22  29* 30* 29*  CREATININE 0.66 0.59* 0.53* 0.62 0.60* 0.77  CALCIUM 9.2 9.0 9.0 8.9 8.9 9.2    CBC: CBC Latest Ref Rng & Units 03/30/2020 03/26/2020 03/20/2020  WBC 4.0 - 10.5 K/uL 15.4(H) 10.4 8.1  Hemoglobin 13.0 - 17.0 g/dL 12.5(L) 13.3 11.2(L)  Hematocrit 39.0 - 52.0 % 37.1(L) 39.5 34.0(L)  Platelets 150 - 400 K/uL 388 469(H) 419(H)    CBG: No results for input(s): GLUCAP in the last 168 hours.  Brief HPI:   Anthony Curry. is a 79 y.o. male with history of stage IV lung cancer, pericardial effusion, A. 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  Declomycin was increased and Remeron was discontinued.  CT of chest was done due to issues with ongoing cough and showed increase in left lung base effusion question due to pneumonitis or RLL changes due to posttraumatic versus disease worsening.  2 D echo done revealing slight increase in his cardiac effusion and cardiology felt that no intervention needed due to no signs of tamponade.  Heme-onc also consulted for input and recommended trial of Augmentin x7 days as well as addition of steroids with slow taper after 1 to 2 weeks.  His hospital course was complicated by left knee pain due to gout, Candida UTI, hypoxia with activity, anxiety as well as significant debility.  CIR was recommended due to functional decline   Hospital Course: Anthony Curry. was admitted to rehab  03/25/2020 for inpatient therapies to consist of PT  and OT at least three hours five days a week. Past admission physiatrist, therapy team and rehab RN have worked together to provide customized collaborative inpatient rehab. Ortho was contacted  for input on hip precautions and recommended 6 total week as well as no squats or twisting. His hip incision and midline incision was noted to be healing well without s/s of infection.  He was started on slow steroid taper and follow up CBC showed leucocytosis likely due to steroid effect. His knee pain has improved after colchicine was tapered to daily prior to discharge.  His blood pressures have been monitored on 3 times daily basis and have been stable.  Multiple nutritional supplements were added to help with low calorie malnutrition.  WOC was also consulted to continue education on his colostomy as patient was now willing to learn care.  Fluid restriction has slowly been liberalized and follow up BMET showed that sodium is trending towards baseline. Palliative care was consulted to help with goals of care as well as provide support.  Flucanazole was continued for additional 7 days due to reports of dysphonia and concerns of candidiasis vaginitis.  Megace was added to help with appetite and Lexapro was added to help with mood stabilization.  Follow-up chest x-ray showed no significant change and patient was advised on pulmonary toilet as well as use of nebs on as needed basis.  He has made good gains during his rehab stay and is currently as supervision level. He will continue to receive follow up [place], [place] and [place] by [facility] after discharge.    Rehab course: During patient's stay in rehab weekly conference was held to monitor  patient's progress, set goals and discuss barriers to discharge. At admission, patient required min assist with ADL tasks and with mobility. He  has had improvement in activity tolerance, balance, postural control as well as ability to compensate for deficits.   Disposition: Home    Diet: Regular.   Special Instructions: 1. Recommend repeat BMET in 1-2 weeks to monitor sodium.   Allergies as of 04/01/2020      Reactions   Aspirin Other (See  Comments)   Cannot consume as a result of him taking Eliquis (Blood thinner)      Medication List    STOP taking these medications   Acidophilus Caps capsule   benzonatate 200 MG capsule Commonly known as: TESSALON   bisacodyl 5 MG EC tablet Commonly known as: DULCOLAX   diltiazem 180 MG 24 hr capsule Commonly known as: DILACOR XR   docusate sodium 100 MG capsule Commonly known as: COLACE   ferrous sulfate 325 (65 FE) MG tablet   finasteride 5 MG tablet Commonly known as: PROSCAR   FISH OIL PO   fluconazole 100 MG tablet Commonly known as: DIFLUCAN   fluticasone 50 MCG/ACT nasal spray Commonly known as: FLONASE   guaiFENesin 600 MG 12 hr tablet Commonly known as: MUCINEX   levalbuterol 0.63 MG/3ML nebulizer solution Commonly known as: XOPENEX   MAGNESIUM PO   mirtazapine 30 MG tablet Commonly known as: REMERON   OPDIVO IV   OVER THE COUNTER MEDICATION   pantoprazole 40 MG tablet Commonly known as: PROTONIX   thiamine 100 MG tablet Commonly known as: Vitamin B-1     TAKE these medications   albuterol 108 (90 Base) MCG/ACT inhaler Commonly known as: VENTOLIN HFA Inhale 1 puff into the lungs every 6 (six) hours as needed  for wheezing or shortness of breath. Notes to patient: For shortness of breath, use only when needed    ALPRAZolam 0.25 MG tablet Commonly known as: XANAX Take 1 tablet (0.25 mg total) by mouth 2 (two) times daily as needed for anxiety. What changed: when to take this Notes to patient: For anxiety. Take only when needed. May cause drowsiness and dizziness.   apixaban 5 MG Tabs tablet Commonly known as: Eliquis Take 1 tablet (5 mg total) by mouth 2 (two) times daily. What changed: Another medication with the same name was removed. Continue taking this medication, and follow the directions you see here. Notes to patient: To prevent clots   colchicine 0.6 MG tablet Take 1 tablet (0.6 mg total) by mouth daily. Notes to patient: To  prevent gout   demeclocycline 150 MG tablet Commonly known as: DECLOMYCIN Take 2 tablets (300 mg total) by mouth 2 (two) times daily. What changed: Another medication with the same name was removed. Continue taking this medication, and follow the directions you see here. Notes to patient: To keep sodium level up   dextromethorphan-guaiFENesin 30-600 MG 12hr tablet Commonly known as: MUCINEX DM Take 1 tablet by mouth 2 (two) times daily. What changed: Another medication with the same name was removed. Continue taking this medication, and follow the directions you see here. Notes to patient: For coughing   diltiazem 180 MG 24 hr capsule Commonly known as: CARDIZEM CD Take 1 capsule (180 mg total) by mouth daily. What changed: when to take this   ENSURE COMPLETE PO Take 1 Can by mouth 2 (two) times daily. Notes to patient: Nutrition supplement   escitalopram 10 MG tablet Commonly known as: LEXAPRO Take 1 tablet (10 mg total) by mouth daily.   HYDROcodone-acetaminophen 5-325 MG tablet Commonly known as: Norco Take 1 tablet by mouth every 6 (six) hours as needed for moderate pain. What changed: Another medication with the same name was removed. Continue taking this medication, and follow the directions you see here. Notes to patient: For severe pain. Take only when needed. May cause drowsiness, dizziness and constipation.   levothyroxine 50 MCG tablet Commonly known as: SYNTHROID Take 1 tablet (50 mcg total) by mouth daily before breakfast. What changed: Another medication with the same name was removed. Continue taking this medication, and follow the directions you see here. Notes to patient: Thyroid supplement   lidocaine 5 % Commonly known as: LIDODERM Place 1 patch onto the skin daily. Remove & Discard patch within 12 hours or as directed by MD Notes to patient: For pain. Remove after 12 hours    magic mouthwash Soln Take 10 mLs by mouth 4 (four) times daily. Notes to  patient: For oral hygiene    megestrol 400 MG/10ML suspension Commonly known as: MEGACE Take 10 mLs (400 mg total) by mouth 2 (two) times daily. Notes to patient: To improve appetite   melatonin 3 MG Tabs tablet Take 1 tablet (3 mg total) by mouth at bedtime. Notes to patient: To help with sleep/Over the counter   menthol-cetylpyridinium 3 MG lozenge Commonly known as: CEPACOL Take 1 lozenge (3 mg total) by mouth as needed for sore throat. Notes to patient: For sore throat. Take only as needed   multivitamin with minerals Tabs tablet Take 1 tablet by mouth daily. Notes to patient: Supplement   polyethylene glycol 17 g packet Commonly known as: MIRALAX / GLYCOLAX Take 17 g by mouth daily as needed for mild constipation. Notes to patient: To  treat constipation as needed   potassium chloride SA 20 MEQ tablet Commonly known as: KLOR-CON Take 1 tablet (20 mEq total) by mouth 2 (two) times daily. What changed:   when to take this  Another medication with the same name was removed. Continue taking this medication, and follow the directions you see here. Notes to patient: Potassium supplement   predniSONE 10 MG tablet Commonly known as: DELTASONE Take 40mg  daily 4/29 -4/30; Take 30mg  5/1-5/2; Take 20mg  daily 5/3- 5/4; Take 10mg  daily 5/5 -5/6; take 5mg  daily 5/7- 5/8. What changed:   medication strength  how much to take  how to take this  when to take this  additional instructions   QUEtiapine 25 MG tablet Commonly known as: SEROQUEL Take 1 tablet (25 mg total) by mouth at bedtime. Notes to patient: To help with sleep   saccharomyces boulardii 250 MG capsule Commonly known as: FLORASTOR Take 1 capsule (250 mg total) by mouth 2 (two) times daily. Notes to patient: Probiotic supplement   sodium chloride 0.65 % Soln nasal spray Commonly known as: OCEAN Place 1 spray into both nostrils as needed for congestion. Notes to patient: For dry or stuffy nose. Take when  needed   tamsulosin 0.4 MG Caps capsule Commonly known as: FLOMAX Take 1 capsule (0.4 mg total) by mouth 2 (two) times daily. What changed: Another medication with the same name was removed. Continue taking this medication, and follow the directions you see here. Notes to patient: To treat enlarged prostate    traZODone 100 MG tablet Commonly known as: DESYREL Take 1 tablet (100 mg total) by mouth at bedtime. Notes to patient: To help with sleep       Follow-up Information    Meredith Staggers, MD. Call.   Specialty: Physical Medicine and Rehabilitation Why: as needed Contact information: [address] [place] [address] [phone]        Geralynn Rile, MD. Call.   Specialties: Internal Medicine, Cardiology, Radiology Why: For follow up on Afib Contact information: [place] [address] [phone]        [name], MD. Call.   Specialty: Family Medicine Why: for post hospital follow up Contact information: [place] [address] [phone]        Marchia Bond, MD. Call.   Specialty: Orthopedic Surgery Why: for post op check Contact information: [address] [address] [phone]           Signed: Bary Leriche 04/01/2020, 12:18 PM

## 2020-04-01 NOTE — Plan of Care (Signed)
Problem: RH SKIN INTEGRITY Goal: RH STG SKIN FREE OF INFECTION/BREAKDOWN Description: Min assist  04/01/2020 0549 by Sandrea Hammond, LPN Outcome: Completed/Met 04/01/2020 0205 by Sandrea Hammond, LPN Outcome: Progressing Goal: RH STG MAINTAIN SKIN INTEGRITY WITH ASSISTANCE Description: STG Maintain Skin Integrity With min assist  04/01/2020 0549 by Sandrea Hammond, LPN Outcome: Completed/Met 04/01/2020 0205 by Sandrea Hammond, LPN Outcome: Progressing   Problem: RH SAFETY Goal: RH STG ADHERE TO SAFETY PRECAUTIONS W/ASSISTANCE/DEVICE Description: STG Adhere to Safety Precautions independently   04/01/2020 0549 by Sandrea Hammond, LPN Outcome: Completed/Met 04/01/2020 0205 by Sandrea Hammond, LPN Outcome: Progressing   Problem: RH KNOWLEDGE DEFICIT GENERAL Goal: RH STG INCREASE KNOWLEDGE OF SELF CARE AFTER HOSPITALIZATION 04/01/2020 0549 by Sandrea Hammond, LPN Outcome: Completed/Met 04/01/2020 0205 by Sandrea Hammond, LPN Outcome: Progressing   Problem: Consults Goal: RH GENERAL PATIENT EDUCATION Description: See Patient Education module for education specifics. 04/01/2020 0549 by Sandrea Hammond, LPN Outcome: Completed/Met 04/01/2020 0205 by Sandrea Hammond, LPN Outcome: Progressing Goal: Skin Care Protocol Initiated - if Braden Score 18 or less Description: If consults are not indicated, leave blank or document N/A 04/01/2020 0549 by Sandrea Hammond, LPN Outcome: Completed/Met 04/01/2020 0205 by Sandrea Hammond, LPN Outcome: Progressing Goal: Nutrition Consult-if indicated 04/01/2020 0549 by Sandrea Hammond, LPN Outcome: Completed/Met 04/01/2020 0205 by Sandrea Hammond, LPN Outcome: Progressing Goal: Diabetes Guidelines if Diabetic/Glucose > 140 Description: If diabetic or lab glucose is > 140 mg/dl - Initiate Diabetes/Hyperglycemia Guidelines & Document Interventions  04/01/2020 0549 by Sandrea Hammond, LPN Outcome: Completed/Met 04/01/2020 0205 by Sandrea Hammond, LPN Outcome: Progressing   Problem: RH  BOWEL ELIMINATION Goal: RH STG MANAGE BOWEL WITH ASSISTANCE Description: STG Manage Bowel with Assistance. 04/01/2020 0549 by Sandrea Hammond, LPN Outcome: Completed/Met 04/01/2020 0205 by Sandrea Hammond, LPN Outcome: Progressing Goal: RH STG MANAGE BOWEL W/MEDICATION W/ASSISTANCE Description: STG Manage Bowel with Medication with Assistance. 04/01/2020 0549 by Sandrea Hammond, LPN Outcome: Completed/Met 04/01/2020 0205 by Sandrea Hammond, LPN Outcome: Progressing   Problem: RH BLADDER ELIMINATION Goal: RH STG MANAGE BLADDER WITH ASSISTANCE Description: STG Manage Bladder With Assistance 04/01/2020 0549 by Sandrea Hammond, LPN Outcome: Completed/Met 04/01/2020 0205 by Sandrea Hammond, LPN Outcome: Progressing Goal: RH STG MANAGE BLADDER WITH EQUIPMENT WITH ASSISTANCE Description: STG Manage Bladder With Equipment With Assistance 04/01/2020 0549 by Sandrea Hammond, LPN Outcome: Completed/Met 04/01/2020 0205 by Sandrea Hammond, LPN Outcome: Progressing   Problem: RH SKIN INTEGRITY Goal: RH STG SKIN FREE OF INFECTION/BREAKDOWN 04/01/2020 0549 by Sandrea Hammond, LPN Outcome: Completed/Met 04/01/2020 0205 by Sandrea Hammond, LPN Outcome: Progressing   Problem: RH SAFETY Goal: RH STG ADHERE TO SAFETY PRECAUTIONS W/ASSISTANCE/DEVICE Description: STG Adhere to Safety Precautions With Assistance/Device. 04/01/2020 0549 by Sandrea Hammond, LPN Outcome: Completed/Met 04/01/2020 0205 by Sandrea Hammond, LPN Outcome: Progressing   Problem: RH PAIN MANAGEMENT Goal: RH STG PAIN MANAGED AT OR BELOW PT'S PAIN GOAL 04/01/2020 0549 by Sandrea Hammond, LPN Outcome: Completed/Met 04/01/2020 0205 by Sandrea Hammond, LPN Outcome: Progressing   Problem: RH KNOWLEDGE DEFICIT GENERAL Goal: RH STG INCREASE KNOWLEDGE OF SELF CARE AFTER HOSPITALIZATION 04/01/2020 0549 by Sandrea Hammond, LPN Outcome: Completed/Met 04/01/2020 0205 by Sandrea Hammond, LPN Outcome: Progressing   Problem: RH Pre-functional/Other (Specify) Goal: RH LTG  Pre-functional (Specify) 04/01/2020 0549 by Sandrea Hammond, LPN Outcome: Completed/Met 04/01/2020 0205 by Sandrea Hammond, LPN Outcome: Progressing Goal: RH LTG Interdisciplinary (Specify) 1 Description: RH LTG Interdisciplinary (Specify)1 04/01/2020 0549 by Sandrea Hammond, LPN Outcome: Completed/Met 04/01/2020 0205 by Sandrea Hammond, LPN Outcome: Progressing Goal: RH LTG Interdisciplinary (Specify) 2 Description: RH LTG Interdisciplinary (  Specify) 2  04/01/2020 0549 by Sandrea Hammond, LPN Outcome: Completed/Met 04/01/2020 0205 by Sandrea Hammond, LPN Outcome: Progressing

## 2020-04-03 ENCOUNTER — Telehealth: Payer: Self-pay | Admitting: Internal Medicine

## 2020-04-03 DIAGNOSIS — I10 Essential (primary) hypertension: Secondary | ICD-10-CM | POA: Diagnosis not present

## 2020-04-03 NOTE — Telephone Encounter (Signed)
R/s appt omn May 12 per MM blocked schedule - pt is aware of 1 pm appt

## 2020-04-04 ENCOUNTER — Other Ambulatory Visit: Payer: Self-pay

## 2020-04-04 ENCOUNTER — Emergency Department (HOSPITAL_COMMUNITY)
Admission: EM | Admit: 2020-04-04 | Discharge: 2020-04-05 | Disposition: A | Discharge status: Home / Self Care | Payer: Medicare Other | Attending: Emergency Medicine | Admitting: Emergency Medicine

## 2020-04-04 DIAGNOSIS — Z79899 Other long term (current) drug therapy: Secondary | ICD-10-CM | POA: Diagnosis not present

## 2020-04-04 DIAGNOSIS — Z8616 Personal history of COVID-19: Secondary | ICD-10-CM | POA: Insufficient documentation

## 2020-04-04 DIAGNOSIS — F0634 Mood disorder due to known physiological condition with mixed features: Secondary | ICD-10-CM | POA: Insufficient documentation

## 2020-04-04 DIAGNOSIS — Z515 Encounter for palliative care: Secondary | ICD-10-CM | POA: Diagnosis not present

## 2020-04-04 DIAGNOSIS — I4891 Unspecified atrial fibrillation: Secondary | ICD-10-CM | POA: Insufficient documentation

## 2020-04-04 DIAGNOSIS — C3490 Malignant neoplasm of unspecified part of unspecified bronchus or lung: Secondary | ICD-10-CM | POA: Insufficient documentation

## 2020-04-04 DIAGNOSIS — R231 Pallor: Secondary | ICD-10-CM | POA: Diagnosis not present

## 2020-04-04 DIAGNOSIS — R531 Weakness: Secondary | ICD-10-CM | POA: Diagnosis not present

## 2020-04-04 DIAGNOSIS — Z7901 Long term (current) use of anticoagulants: Secondary | ICD-10-CM | POA: Diagnosis not present

## 2020-04-04 DIAGNOSIS — R4589 Other symptoms and signs involving emotional state: Secondary | ICD-10-CM

## 2020-04-04 DIAGNOSIS — Z87891 Personal history of nicotine dependence: Secondary | ICD-10-CM | POA: Diagnosis not present

## 2020-04-04 DIAGNOSIS — I959 Hypotension, unspecified: Secondary | ICD-10-CM | POA: Diagnosis not present

## 2020-04-04 DIAGNOSIS — R42 Dizziness and giddiness: Secondary | ICD-10-CM | POA: Insufficient documentation

## 2020-04-04 DIAGNOSIS — Z7189 Other specified counseling: Secondary | ICD-10-CM | POA: Diagnosis not present

## 2020-04-04 DIAGNOSIS — Z20822 Contact with and (suspected) exposure to covid-19: Secondary | ICD-10-CM | POA: Insufficient documentation

## 2020-04-04 DIAGNOSIS — F329 Major depressive disorder, single episode, unspecified: Secondary | ICD-10-CM | POA: Diagnosis not present

## 2020-04-04 DIAGNOSIS — E860 Dehydration: Secondary | ICD-10-CM | POA: Diagnosis not present

## 2020-04-04 DIAGNOSIS — R001 Bradycardia, unspecified: Secondary | ICD-10-CM | POA: Diagnosis not present

## 2020-04-04 LAB — COMPREHENSIVE METABOLIC PANEL
ALT: 24 U/L (ref 0–44)
AST: 20 U/L (ref 15–41)
Albumin: 3 g/dL — ABNORMAL LOW (ref 3.5–5.0)
Alkaline Phosphatase: 97 U/L (ref 38–126)
Anion gap: 9 (ref 5–15)
BUN: 19 mg/dL (ref 8–23)
CO2: 21 mmol/L — ABNORMAL LOW (ref 22–32)
Calcium: 8.4 mg/dL — ABNORMAL LOW (ref 8.9–10.3)
Chloride: 100 mmol/L (ref 98–111)
Creatinine, Ser: 0.69 mg/dL (ref 0.61–1.24)
GFR calc Af Amer: >60 mL/min (ref >60)
GFR calc non Af Amer: >60 mL/min (ref >60)
Glucose, Bld: 93 mg/dL (ref 70–99)
Potassium: 3.9 mmol/L (ref 3.5–5.1)
Sodium: 130 mmol/L — ABNORMAL LOW (ref 135–145)
Total Bilirubin: 1.5 mg/dL — ABNORMAL HIGH (ref 0.3–1.2)
Total Protein: 5.2 g/dL — ABNORMAL LOW (ref 6.5–8.1)

## 2020-04-04 LAB — CBC
HCT: 41 % (ref 39.0–52.0)
Hemoglobin: 13.5 g/dL (ref 13.0–17.0)
MCH: 28.2 pg (ref 26.0–34.0)
MCHC: 32.9 g/dL (ref 30.0–36.0)
MCV: 85.6 fL (ref 80.0–100.0)
Platelets: 296 10*3/uL (ref 150–400)
RBC: 4.79 MIL/uL (ref 4.22–5.81)
RDW: 16.4 % — ABNORMAL HIGH (ref 11.5–15.5)
WBC: 14.7 10*3/uL — ABNORMAL HIGH (ref 4.0–10.5)
nRBC: 0 % (ref 0.0–0.2)

## 2020-04-04 NOTE — ED Notes (Signed)
RN spoke with patient's spouse who will arrange for her grandson to pick up patient-Monique,RN

## 2020-04-04 NOTE — BH Assessment (Signed)
Clinician called to inquire about completing pt's [place] Assessment but first asked to ensure pt has been medically cleared. Pt's nurse, Mackey Birchwood RN, stated pt has not yet been medically cleared. Clinician shared pts cannot have their BH Assessments performed until they have been medically cleared due to concerns that they could be medically admitted and requested that she be called if/when pt is cleared. Pt's nurse agreed to contact clinician.

## 2020-04-04 NOTE — ED Notes (Signed)
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 Patient is explained reasoning for transfer to unit; Patient denies SI/HI; Patient denies A/V hallucinations of any kind; Pt does not seem to be responding to internal stimuli; pt phycial appea is very emaciated and with yellowing of skin; pt is a current [facility] patient with DNR in place; Patient does present to be depressed-Monique,RN

## 2020-04-04 NOTE — ED Triage Notes (Signed)
Pt. C/o weakness and dizziness. Pt. Was walking in bedroom and suddenly fell to floor. Pt is on Eliquis. EMS noted orthostatic vitals, initial b/p lying 98/60, sitting b/p 72/48.

## 2020-04-04 NOTE — Discharge Instructions (Addendum)
It was our pleasure to provide your ER care today - we hope that you feel better.  Follow up with palliative care/hospice/home health team and your doctors as arranged.   Return to ER if worse, new symptoms, trouble breathing, severe or intractable pain, or other emergency.

## 2020-04-04 NOTE — ED Provider Notes (Signed)
[facility] EMERGENCY DEPARTMENT Provider Note   CSN: [id number] Arrival date & time: 04/04/20  1907     History Chief Complaint  Patient presents with  . Weakness  . Dizziness  . Fall    Anthony Curry. is a 79 y.o. male.  Patient with stage IV lung ca, presents with generalized weakness. Pt notes feeling generally weak since leaving hospital last week. States this evening, had stood from lying position, when felt faint/lightheaded. Spouse assisted him back down, so no trauma or injury. No headache. No neck or back pain. No extremity pain or injury. Pt indicates he is very frustrated with all his medical conditions. States he sent away a home health person, but now he and spouse are having difficulty managing ostomy bags. States was supposed to go to urology appt last week, but didn't go, and it is rescheduled for Monday. Wants to have his colostomy reversed, and says appt with surgeon is not until next  Wednesday. Patient feels depressed, but indicates he is not taking his antidepressant medication. Very poor appetite, and poor po intake. Spouse indicates in past 1-2 days, he has been saying he just wants to die, frustrated with everything, depressed - she feels he needs mental health eval.   The history is provided by the patient, the spouse and the EMS personnel.  Weakness Associated symptoms: dizziness   Associated symptoms: no abdominal pain, no chest pain, no dysuria, no fever, no headaches, no shortness of breath and no vomiting   Dizziness Associated symptoms: weakness   Associated symptoms: no chest pain, no headaches, no shortness of breath and no vomiting   Fall Pertinent negatives include no chest pain, no abdominal pain, no headaches and no shortness of breath.       Past Medical History:  Diagnosis Date  . 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 Adrenal mass ([place])   . Alcoholism in remission ([place])   . Anemia of chronic disease   . Ankle fracture    right  . 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 Anxiety   . Atrial fibrillation, chronic ([place])   . BPH (benign prostatic hyperplasia)   . Cancer ([place])    small cell/lung  . Complication of anesthesia 09/08/2015    JITTERY AFTER GALLBLADDER SURGERY  . COVID-19 12/2019  . Depression   . Epididymoorchitis 01/30/2020   with right hydrocele  . Femur fracture ([place])   . GERD (gastroesophageal reflux disease)   . History of radiation therapy 11/14/16, 11/18/16, 11/22/16   Left upper lung: 54 Gy in 3 fractions  . Hypertension   . Hyponatremia   . Multiple falls   . Neoplastic malignant related fatigue    IV tx. every 2 weeks last9-14-16 (Dr. Earlie Server), radiation last tx. 6 months  . 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 Perforated diverticulum   . Pericardial effusion   . Persistent atrial fibrillation ([place])   . Protein calorie malnutrition ([place])   . Pulmonary nodules   . Radiation 12/02/14-12/16/14   Left adrenal gland 30 Gy in 10 fractions  . Stage 4 lung cancer, unspecified laterality ([place])   . Urinary retention     Patient Active Problem List   Diagnosis Date Noted  . Protein-calorie malnutrition, severe 03/30/2020  . Palliative care by specialist   . Goals of care, counseling/discussion   . DNR (do not resuscitate)   . Debility 03/25/2020  . Pneumonitis   . Acute gout of left knee   .  Candida UTI   . Supplemental oxygen dependent   . Pressure injury of skin 03/16/2020  . Hyponatremia 03/13/2020  . Fall (on)(from) sidewalk curb, initial encounter 03/13/2020  . Multiple fractures of rib involving four or more ribs 03/13/2020  . Diverticulitis 03/13/2020  . Hypothyroidism (acquired) 03/13/2020  . Stage 4 lung cancer, unspecified laterality ([place])   . Pericardial effusion   . Atrial fibrillation, chronic ([place])   . Left displaced femoral neck fracture ([place]) 02/17/2020  . Pericardial effusion 02/17/2020  . Thromboembolism ([place]) 02/17/2020  . Hip  fracture ([place]) 02/17/2020  . Diverticulitis 01/02/2020  . Acute diverticulitis 01/02/2020  . Ataxia 06/30/2019  . 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 Generalized weakness 06/28/2019  . Lactic acidosis 06/28/2019  . Abnormal urinalysis 06/28/2019  . Hypertension 12/07/2016  . 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 Acute urinary retention 09/09/2015  . Choledocholithiasis with cholecystitis 09/08/2015  . Common bile duct stone   . Encounter for antineoplastic immunotherapy 06/28/2015  . Atrial fibrillation ([place]) 06/11/2015  . Gynecomastia, male 06/10/2015  . Dizziness 05/11/2015  . Malignant neoplasm of lower lobe of right lung ([place]) 09/17/2014  . Neutropenic fever ([place]) 09/02/2014  . Neoplastic malignant related fatigue 08/13/2014  . Physical deconditioning 08/13/2014  . Hypoalbuminemia 08/13/2014  . Diarrhea 08/13/2014  . Antineoplastic chemotherapy induced anemia(285.3) 08/13/2014  . Atrial fibrillation with RVR ([place]) 07/28/2014  . HCAP (healthcare-associated pneumonia) 07/25/2014  . Alcoholism in remission ([place]) 07/25/2014  . Atrial flutter by electrocardiogram ([place]) 07/25/2014  . Swelling of limb 06/10/2014  . Lung cancer ([place]) 10/15/2013  . Hypotension 09/10/2013  . Adrenal mass ([place]) 09/09/2013  . 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 Pulmonary nodules 08/19/2013  . Anemia of chronic disease 07/03/2013  . BPH (benign prostatic hyperplasia) 07/03/2013  . GERD (gastroesophageal reflux disease) 07/03/2013  . Fall 07/03/2013  . Alcohol intoxication ([place]) 07/03/2013  . 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 Alcohol abuse 10/15/2011  . Hyponatremia 10/15/2011    Past Surgical History:  Procedure Laterality Date  . ANKLE ARTHRODESIS  1995   rt fx-hardware in  . CARDIOVERSION N/A 09/29/2014   Procedure: CARDIOVERSION;  Surgeon: Josue Hector, MD;  Location: [facility] ENDOSCOPY;  Service: Cardiovascular;  Laterality:  N/A;  . CHOLECYSTECTOMY  09/08/2015    laproscopic   . CHOLECYSTECTOMY N/A 09/08/2015   Procedure: LAPAROSCOPIC CHOLECYSTECTOMY WITH ATTEMPTED INTRAOPERATIVE CHOLANGIOGRAM ;  Surgeon: Fanny Skates, MD;  Location: [facility];  Service: General;  Laterality: N/A;  . COLON SURGERY     sigmoid colectomy with colostomy for ruptured diverticulum  . COLONOSCOPY    . EMBOLECTOMY Left 01/06/2020   Procedure: Embolectomy left axillary, brachial, radial, and ulnar artery;  Surgeon: Elam Dutch, MD;  Location: [facility] OR;  Service: Vascular;  Laterality: Left;  . ERCP N/A 09/02/2015   Procedure: ENDOSCOPIC RETROGRADE CHOLANGIOPANCREATOGRAPHY (ERCP);  Surgeon: Arta Silence, MD;  Location: [facility] END[facility]COPY;  Service: Endoscopy;  Laterality: N/A;  . ERCP N/A 09/07/2015   Procedure: ENDOSCOPIC RETROGRADE CHOLANGIOPANCREATOGRAPHY (ERCP);  Surgeon: Clarene Essex, MD;  Location: [facility] ENDOSCOPY;  Service: Endoscopy;  Laterality: N/A;  . EXCISION / CURETTAGE BONE CYST FINGER  2005   rt thumb  . hemiarthroplasty Left 02/2020  . HEMORRHOID SURGERY    . HIP ARTHROPLASTY Left 02/18/2020   Procedure: LEFT HIP HEMIARTHROPLASTY;  Surgeon: Marchia Bond, MD;  Location: [facility] ORS;  Service: Orthopedics;  Laterality: Left;  . LAPAROTOMY N/A 01/05/2020   Procedure: EXPLORATORY LAPAROTOMY sigmoid colon resection creation end  colostomy for perforated diverticulitis and purulent peritonitis;  Surgeon: Kinsinger, Arta Bruce, MD;  Location: [facility] ORS;  Service: General;  Laterality: N/A;  . Percutaneous Cholecytostomy tube     IVR insertion 06-13-15(Dr. Vernard Gambles)- 08-28-15 remains in place to drainage bag  . TONSILLECTOMY    . TOTAL SHOULDER ARTHROPLASTY  02/03/2012   Procedure: TOTAL SHOULDER ARTHROPLASTY;  Surgeon: [name], MD;  Location: [facility];  Service: Orthopedics;  Laterality: Left;       Family History  Problem Relation Age of Onset  . Lung cancer Mother   . Heart attack Father   . Brain cancer Brother   .  Hypertension Father   . Diabetes Paternal Grandmother   . Heart attack Brother   . Neuropathy Neg Hx     Social History   Tobacco Use  . Smoking status: Former Smoker    Packs/day: 1.00    Years: 30.00    Pack years: 30.00    Types: Cigarettes    Quit date: 12/05/1990    Years since quitting: 29.3  . Smokeless tobacco: Never Used  Substance Use Topics  . Alcohol use: Not Currently    Comment: h/o heavy use  . Drug use: Never    Home Medications Prior to Admission medications   Medication Sig Start Date End Date Taking? Authorizing Provider  albuterol (VENTOLIN HFA) 108 (90 Base) MCG/ACT inhaler Inhale 1 puff into the lungs every 6 (six) hours as needed for wheezing or shortness of breath. 03/31/20   Love, Ivan Anchors, PA-C  ALPRAZolam (XANAX) 0.25 MG tablet Take 1 tablet (0.25 mg total) by mouth 2 (two) times daily as needed for anxiety. 03/31/20   Love, Ivan Anchors, PA-C  apixaban (ELIQUIS) 5 MG TABS tablet Take 1 tablet (5 mg total) by mouth 2 (two) times daily. 03/31/20   Love, Ivan Anchors, PA-C  colchicine 0.6 MG tablet Take 1 tablet (0.6 mg total) by mouth daily. 04/01/20   Love, Ivan Anchors, PA-C  demeclocycline (DECLOMYCIN) 150 MG tablet Take 2 tablets (300 mg total) by mouth 2 (two) times daily. 03/31/20   Love, Ivan Anchors, PA-C  dextromethorphan-guaiFENesin (MUCINEX DM) 30-600 MG 12hr tablet Take 1 tablet by mouth 2 (two) times daily. 03/31/20   Love, Ivan Anchors, PA-C  diltiazem (CARDIZEM CD) 180 MG 24 hr capsule Take 1 capsule (180 mg total) by mouth daily. 04/01/20   Love, Ivan Anchors, PA-C  escitalopram (LEXAPRO) 10 MG tablet Take 1 tablet (10 mg total) by mouth daily. 04/01/20   Love, Ivan Anchors, PA-C  HYDROcodone-acetaminophen (NORCO) 5-325 MG tablet Take 1 tablet by mouth every 6 (six) hours as needed for moderate pain. 03/03/20   Heilingoetter, Cassandra L, PA-C  levothyroxine (SYNTHROID) 50 MCG tablet Take 1 tablet (50 mcg total) by mouth daily before breakfast. 03/31/20   Love, Ivan Anchors, PA-C    lidocaine (LIDODERM) 5 % Place 1 patch onto the skin daily. Remove & Discard patch within 12 hours or as directed by MD 03/31/20   Love, Ivan Anchors, PA-C  magic mouthwash SOLN Take 10 mLs by mouth 4 (four) times daily. 03/31/20   Love, Ivan Anchors, PA-C  megestrol (MEGACE) 400 MG/10ML suspension Take 10 mLs (400 mg total) by mouth 2 (two) times daily. 03/31/20   Love, Ivan Anchors, PA-C  melatonin 3 MG TABS tablet Take 1 tablet (3 mg total) by mouth at bedtime. 03/31/20   Love, Ivan Anchors, PA-C  menthol-cetylpyridinium (CEPACOL) 3 MG lozenge Take 1 lozenge (3  mg total) by mouth as needed for sore throat. 03/25/20   Terrilee Croak, MD  Multiple Vitamin (MULTIVITAMIN WITH MINERALS) TABS tablet Take 1 tablet by mouth daily. 04/01/20   Love, Ivan Anchors, PA-C  Nutritional Supplements (ENSURE COMPLETE PO) Take 1 Can by mouth 2 (two) times daily.    [provider]  polyethylene glycol (MIRALAX / GLYCOLAX) 17 g packet Take 17 g by mouth daily as needed for mild constipation. 03/25/20   Dahal, Marlowe Aschoff, MD  potassium chloride SA (KLOR-CON) 20 MEQ tablet Take 1 tablet (20 mEq total) by mouth 2 (two) times daily. 03/31/20   Love, Ivan Anchors, PA-C  predniSONE (DELTASONE) 10 MG tablet Take 40mg  daily 4/29 -4/30; Take 30mg  5/1-5/2; Take 20mg  daily 5/3- 5/4; Take 10mg  daily 5/5 -5/6; take 5mg  daily 5/7- 5/8. 03/31/20   Love, Ivan Anchors, PA-C  QUEtiapine (SEROQUEL) 25 MG tablet Take 1 tablet (25 mg total) by mouth at bedtime. 03/31/20   Love, Ivan Anchors, PA-C  saccharomyces boulardii (FLORASTOR) 250 MG capsule Take 1 capsule (250 mg total) by mouth 2 (two) times daily. 03/31/20   Love, Ivan Anchors, PA-C  sodium chloride (OCEAN) 0.65 % SOLN nasal spray Place 1 spray into both nostrils as needed for congestion. 03/25/20   Terrilee Croak, MD  tamsulosin (FLOMAX) 0.4 MG CAPS capsule Take 1 capsule (0.4 mg total) by mouth 2 (two) times daily. 03/31/20   Love, Ivan Anchors, PA-C  traZODone (DESYREL) 100 MG tablet Take 1 tablet (100 mg total) by mouth at  bedtime. 03/31/20   Love, Ivan Anchors, PA-C    Allergies    Aspirin  Review of Systems   Review of Systems  Constitutional: Negative for fever.  HENT: Negative for sore throat.   Eyes: Negative for visual disturbance.  Respiratory: Negative for shortness of breath.   Cardiovascular: Negative for chest pain.  Gastrointestinal: Negative for abdominal pain and vomiting.  Genitourinary: Negative for dysuria and flank pain.  Musculoskeletal: Negative for back pain and neck pain.  Skin: Negative for rash.  Neurological: Positive for dizziness and weakness. Negative for speech difficulty and headaches.  Hematological: Does not bruise/bleed easily.  Psychiatric/Behavioral: Positive for dysphoric mood.    Physical Exam Updated Vital Signs BP 119/65 (BP Location: Right Arm)   Pulse 79   Temp 97.6 F (36.4 C) (Oral)   Resp (!) 22   Ht 1.803 m (5\' 11" )   Wt 58.5 kg   SpO2 97%   BMI 17.99 kg/m   Physical Exam Vitals and nursing note reviewed.  Constitutional:      Appearance: He is well-developed.     Comments: Chronically ill appearing, thin/cachectic.   HENT:     Head: Atraumatic.     Nose: Nose normal.     Mouth/Throat:     Mouth: Mucous membranes are moist.     Pharynx: Oropharynx is clear.  Eyes:     General: No scleral icterus.    Conjunctiva/sclera: Conjunctivae normal.     Pupils: Pupils are equal, round, and reactive to light.  Neck:     Trachea: No tracheal deviation.  Cardiovascular:     Rate and Rhythm: Normal rate and regular rhythm.     Pulses: Normal pulses.     Heart sounds: Normal heart sounds. No murmur. No friction rub. No gallop.   Pulmonary:     Effort: Pulmonary effort is normal. No accessory muscle usage or respiratory distress.     Breath sounds: Normal breath sounds.  Abdominal:  General: Bowel sounds are normal. There is no distension.     Palpations: Abdomen is soft.     Tenderness: There is no abdominal tenderness. There is no guarding.      Comments: Ostomy pink patent functioning, brown stool in bag.   Genitourinary:    Comments: No cva tenderness. Foley in place, clear urine in bag Musculoskeletal:        General: No swelling.     Cervical back: Normal range of motion and neck supple. No rigidity.     Comments: CTLS spine, non tender, aligned, no step off. Good rom bil extremities without pain or focal bony tenderness.   Skin:    General: Skin is warm and dry.     Findings: No rash.  Neurological:     Mental Status: He is alert.     Comments: Alert, speech clear. Speech clear/fleunt. Motor/sens grossly intact bil.   Psychiatric:     Comments: Depressed mood.      ED Results / Procedures / Treatments   Labs (all labs ordered are listed, but only abnormal results are displayed) Results for orders placed or performed during the hospital encounter of 03/25/20  CBC WITH DIFFERENTIAL  Result Value Ref Range   WBC 10.4 4.0 - 10.5 K/uL   RBC 4.58 4.22 - 5.81 MIL/uL   Hemoglobin 13.3 13.0 - 17.0 g/dL   HCT 39.5 39.0 - 52.0 %   MCV 86.2 80.0 - 100.0 fL   MCH 29.0 26.0 - 34.0 pg   MCHC 33.7 30.0 - 36.0 g/dL   RDW 15.0 11.5 - 15.5 %   Platelets 469 (H) 150 - 400 K/uL   nRBC 0.0 0.0 - 0.2 %   Neutrophils Relative % 72 %   Neutro Abs 7.4 1.7 - 7.7 K/uL   Lymphocytes Relative 14 %   Lymphs Abs 1.5 0.7 - 4.0 K/uL   Monocytes Relative 12 %   Monocytes Absolute 1.2 (H) 0.1 - 1.0 K/uL   Eosinophils Relative 0 %   Eosinophils Absolute 0.0 0.0 - 0.5 K/uL   Basophils Relative 0 %   Basophils Absolute 0.0 0.0 - 0.1 K/uL   Immature Granulocytes 2 %   Abs Immature Granulocytes 0.22 (H) 0.00 - 0.07 K/uL  Comprehensive metabolic panel  Result Value Ref Range   Sodium 133 (L) 135 - 145 mmol/L   Potassium 3.3 (L) 3.5 - 5.1 mmol/L   Chloride 95 (L) 98 - 111 mmol/L   CO2 28 22 - 32 mmol/L   Glucose, Bld 95 70 - 99 mg/dL   BUN 19 8 - 23 mg/dL   Creatinine, Ser 0.66 0.61 - 1.24 mg/dL   Calcium 9.2 8.9 - 10.3 mg/dL   Total  Protein 5.4 (L) 6.5 - 8.1 g/dL   Albumin 3.0 (L) 3.5 - 5.0 g/dL   AST 19 15 - 41 U/L   ALT 25 0 - 44 U/L   Alkaline Phosphatase 140 (H) 38 - 126 U/L   Total Bilirubin 0.4 0.3 - 1.2 mg/dL   GFR calc non Af Amer >60 >60 mL/min   GFR calc Af Amer >60 >60 mL/min   Anion gap 10 5 - 15  Basic metabolic panel  Result Value Ref Range   Sodium 133 (L) 135 - 145 mmol/L   Potassium 4.0 3.5 - 5.1 mmol/L   Chloride 99 98 - 111 mmol/L   CO2 25 22 - 32 mmol/L   Glucose, Bld 82 70 - 99 mg/dL   BUN  20 8 - 23 mg/dL   Creatinine, Ser 0.59 (L) 0.61 - 1.24 mg/dL   Calcium 9.0 8.9 - 10.3 mg/dL   GFR calc non Af Amer >60 >60 mL/min   GFR calc Af Amer >60 >60 mL/min   Anion gap 9 5 - 15  Basic metabolic panel  Result Value Ref Range   Sodium 132 (L) 135 - 145 mmol/L   Potassium 4.2 3.5 - 5.1 mmol/L   Chloride 96 (L) 98 - 111 mmol/L   CO2 28 22 - 32 mmol/L   Glucose, Bld 94 70 - 99 mg/dL   BUN 22 8 - 23 mg/dL   Creatinine, Ser 0.53 (L) 0.61 - 1.24 mg/dL   Calcium 9.0 8.9 - 10.3 mg/dL   GFR calc non Af Amer >60 >60 mL/min   GFR calc Af Amer >60 >60 mL/min   Anion gap 8 5 - 15  Basic metabolic panel  Result Value Ref Range   Sodium 129 (L) 135 - 145 mmol/L   Potassium 4.6 3.5 - 5.1 mmol/L   Chloride 97 (L) 98 - 111 mmol/L   CO2 26 22 - 32 mmol/L   Glucose, Bld 93 70 - 99 mg/dL   BUN 29 (H) 8 - 23 mg/dL   Creatinine, Ser 0.62 0.61 - 1.24 mg/dL   Calcium 8.9 8.9 - 10.3 mg/dL   GFR calc non Af Amer >60 >60 mL/min   GFR calc Af Amer >60 >60 mL/min   Anion gap 6 5 - 15  CBC  Result Value Ref Range   WBC 15.4 (H) 4.0 - 10.5 K/uL   RBC 4.31 4.22 - 5.81 MIL/uL   Hemoglobin 12.5 (L) 13.0 - 17.0 g/dL   HCT 37.1 (L) 39.0 - 52.0 %   MCV 86.1 80.0 - 100.0 fL   MCH 29.0 26.0 - 34.0 pg   MCHC 33.7 30.0 - 36.0 g/dL   RDW 15.4 11.5 - 15.5 %   Platelets 388 150 - 400 K/uL   nRBC 0.0 0.0 - 0.2 %  Basic metabolic panel  Result Value Ref Range   Sodium 130 (L) 135 - 145 mmol/L   Potassium 4.6 3.5 -  5.1 mmol/L   Chloride 97 (L) 98 - 111 mmol/L   CO2 27 22 - 32 mmol/L   Glucose, Bld 101 (H) 70 - 99 mg/dL   BUN 30 (H) 8 - 23 mg/dL   Creatinine, Ser 0.60 (L) 0.61 - 1.24 mg/dL   Calcium 8.9 8.9 - 10.3 mg/dL   GFR calc non Af Amer >60 >60 mL/min   GFR calc Af Amer >60 >60 mL/min   Anion gap 6 5 - 15  Basic metabolic panel  Result Value Ref Range   Sodium 134 (L) 135 - 145 mmol/L   Potassium 4.3 3.5 - 5.1 mmol/L   Chloride 97 (L) 98 - 111 mmol/L   CO2 30 22 - 32 mmol/L   Glucose, Bld 119 (H) 70 - 99 mg/dL   BUN 29 (H) 8 - 23 mg/dL   Creatinine, Ser 0.77 0.61 - 1.24 mg/dL   Calcium 9.2 8.9 - 10.3 mg/dL   GFR calc non Af Amer >60 >60 mL/min   GFR calc Af Amer >60 >60 mL/min   Anion gap 7 5 - 15   *Note: Due to a large number of results and/or encounters for the requested time period, some results have not been displayed. A complete set of results can be found in Results Review.  DG Chest 2 View  Result Date: 03/13/2020 CLINICAL DATA:  Ground level fall. Prior diagnosis of pneumonia 3-4 weeks ago with persistent cough. EXAM: CHEST - 2 VIEW COMPARISON:  02/22/2020 and 02/24/2020 chest radiograph. FINDINGS: Emphysema. Decreased density of left basilar/retrocardiac consolidative opacity. Mid to lower lung right consolidative opacities are less conspicuous. No pneumothorax. Trace left pleural effusion, less conspicuous than prior exam. Partially obscured cardiomediastinal silhouette. Aortic arch atherosclerotic calcifications. Sequela of left shoulder arthroplasty. Chronic multilevel right rib fracture deformities. Multilevel spondylosis. IMPRESSION: Decreased density of left basilar/retrocardiac consolidation. Trace left pleural effusion. Right mid to lower lung opacities are less conspicuous. Electronically Signed   By: Primitivo Gauze M.D.   On: 03/13/2020 10:41   CT Head Wo Contrast  Result Date: 03/13/2020 CLINICAL DATA:  [place]. Facial trauma. EXAM: CT HEAD WITHOUT CONTRAST CT  MAXILLOFACIAL WITHOUT CONTRAST CT CERVICAL SPINE WITHOUT CONTRAST TECHNIQUE: Multidetector CT imaging of the head, cervical spine, and maxillofacial structures were performed using the standard protocol without intravenous contrast. Multiplanar CT image reconstructions of the cervical spine and maxillofacial structures were also generated. COMPARISON:  MRI brain 06/30/2019 and head CT from 2015. FINDINGS: CT HEAD FINDINGS Brain: Stable age related cerebral atrophy, ventriculomegaly and periventricular white matter disease. Scattered basal ganglia calcifications. No extra-axial fluid collections are identified. No CT findings for acute hemispheric infarction or intracranial hemorrhage. No mass lesions. The brainstem and cerebellum are normal. Vascular: Vascular calcifications but no aneurysm or hyperdense vessels. Skull: No skull fracture or bone lesions. Other: No scalp lesions or hematoma. CT MAXILLOFACIAL FINDINGS Osseous: Exam limited by patient motion but no definite facial bone fractures are identified. The mandibular condyles are normally located. No obvious mandible fracture. Orbits: The bony orbits are intact. Of the globes are intact. Sinuses: Patchy ethmoid sinus disease and partial opacification of the right half of the sphenoid sinus. The right maxillary sinus contains a mucous retention cyst or polyp. Soft tissues: No significant soft tissue findings. CT CERVICAL SPINE FINDINGS Alignment: Normal overall alignment. There is severe degenerative cervical spondylosis with multilevel disc disease and facet disease and mild multilevel degenerative subluxations. Skull base and vertebrae: Exam limited by motion artifact but no definite acute fractures. Soft tissues and spinal canal: No prevertebral fluid or swelling. No visible canal hematoma. Disc levels: The spinal canal is patent. Mild multilevel spinal and foraminal stenosis but no large disc protrusions. Upper chest: Emphysematous changes are noted at the  lung apices with dense scarring changes. Other: Carotid artery calcifications are noted. IMPRESSION: 1. Stable age related cerebral atrophy, ventriculomegaly and periventricular white matter disease. 2. No acute intracranial findings or skull fracture. 3. No definite acute facial bone fractures. 4. Severe degenerative cervical spondylosis with multilevel disc disease and facet disease but no definite acute cervical spine fracture. Electronically Signed   By: Marijo Sanes M.D.   On: 03/13/2020 10:09   CT CHEST W CONTRAST  Addendum Date: 03/13/2020   ADDENDUM REPORT: 03/13/2020 19:50 ADDENDUM: Signs of interval left hip arthroplasty. Fluid about the arthroplasty may be postoperative given recent placement. Correlate with any clinical signs of infection. Electronically Signed   By: Zetta Bills M.D.   On: 03/13/2020 19:50   Result Date: 03/13/2020 CLINICAL DATA:  Small cell lung cancer assess treatment response. Also experience ground level fall, on Eliquis. EXAM: CT CHEST, ABDOMEN, AND PELVIS WITH CONTRAST TECHNIQUE: Multidetector CT imaging of the chest, abdomen and pelvis was performed following the standard protocol during bolus administration of intravenous contrast. CONTRAST:  192mL OMNIPAQUE  IOHEXOL 300 MG/ML  SOLN COMPARISON:  01/23/2020 FINDINGS: CT CHEST FINDINGS Cardiovascular: Calcified and noncalcified atherosclerotic plaque throughout the aorta its branches in the chest. No aneurysmal dilation of thoracic aorta. Are stable in terms of mild dilation. Enlarging pericardial effusion, 2 cm thickness along the mid cardiac contour previously approximately 1 cm. Mediastinum/Nodes: Limited assessment of thoracic inlet structures due to motion artifact. No axillary lymphadenopathy. No hilar lymphadenopathy. No mediastinal lymphadenopathy. Esophagus grossly normal. Lungs/Pleura: Bronchial wall thickening at the left hilum with signs of lower lobe bronchiectasis, this is developed since the previous study.  Small left-sided effusion is enlarged. Small right effusion with basilar volume loss. Multifocal areas of ground-glass nodularity within aerated portions of the chest and areas of part solid nodules, similar to the prior study. (Image 47, series 4) 2.8 x 1.8 cm, previously 2.9 x 1.6 cm. Solid component appears similar. Ground-glass with increased soft tissue density with subpleural bandlike opacity that has developed amidst ground-glass changes in the right upper lobe (image 57, series 4) 1.7 by 0.7 cm, no solid-appearing abnormality was present in this location on the previous exam. Pure ground-glass lesion in the left upper lobe shows no solid component on today's study measuring approximately 2.4 cm in greatest axial dimension appearing more conspicuous but unchanged in terms of size. Areas of volume loss and opacity in the right lower lobe show increasing density along the superior aspect in the superior segment of the right lower lobe (image 78, series 4) 2.5 x 1.6 cm, this area previously measured approximately 2 point 2 by 1.2 cm. Background pulmonary emphysema. Musculoskeletal: No chest wall mass. See below for full musculoskeletal details. CT ABDOMEN PELVIS FINDINGS Hepatobiliary: Small hyperdense focus in the dome of the right hemi liver approximately cm unchanged likely a small hemangioma. Low-density focus along the margin of the inferior right hemi liver anteriorly (image 63, series 7) 8 mm, unchanged as is another area of low-density in hepatic subsegment VI measuring 15 mm. Pancreas: Pancreas is normal without focal lesion or ductal dilation. Spleen: Spleen is normal size without focal lesion. Adrenals/Urinary Tract: Adrenal glands with mild thickening of the right adrenal gland unchanged. Left adrenal gland is normal. Renal enhancement is symmetric with some scarring along the lateral cortex of the right kidney. Small low-density foci in the left kidney are likely small cysts and unchanged. No  hydronephrosis. Stomach/Bowel: No acute small bowel process. 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 Stool fills much of the colon. The appendix is normal. Vascular/Lymphatic: Calcified and noncalcified plaque of the abdominal aorta without aneurysm. No abdominal adenopathy. No pelvic adenopathy. Reproductive: Prostate is small. Foley catheter in the urinary bladder. Other: Left lower quadrant colostomy. No free air. No ascites. Musculoskeletal: Signs of previous trauma to the right chest but with new right-sided rib fractures involving the right fourth, sixth, seventh and eighth ribs posteriorly. Acute fracture also of anterior right sixth rib. Sternum is intact. Costochondral elements are intact. Marked glenohumeral degenerative changes on the right with post operative changes of left shoulder arthroplasty as before. Signs of prior rib fracture on the left as well with similar appearance to the previous study. Multilevel spinal degenerative change. IMPRESSION: 1. Increased pericardial effusion. Echocardiography may be helpful given moderate to marked increase in size since previous examination. 2. Signs of previous trauma to the right chest but with new right-sided rib fractures involving the right fourth, sixth, seventh and eighth ribs posteriorly. 3. Increasing left effusion with bronchial wall thickening in  the left lung base. Findings could be related to aspiration pneumonitis and are associated with airspace disease on today's study. 4. Increasing nodularity in the right chest. Correlate with any ongoing radiotherapy. Pneumonitis or worsening of disease is also considered. Short interval follow-up may be helpful as findings are of uncertain significance in the setting of trauma with right-sided rib fractures. 5. Areas also in the right lower lobe that warrant attention on follow-up, potentially related to posttraumatic changes or inflammation though disease worsening is  also considered. 6. Small left-sided effusion is enlarged. Electronically Signed: By: Zetta Bills M.D. On: 03/13/2020 17:20   CT Cervical Spine Wo Contrast  Result Date: 03/13/2020 CLINICAL DATA:  [place]. Facial trauma. EXAM: CT HEAD WITHOUT CONTRAST CT MAXILLOFACIAL WITHOUT CONTRAST CT CERVICAL SPINE WITHOUT CONTRAST TECHNIQUE: Multidetector CT imaging of the head, cervical spine, and maxillofacial structures were performed using the standard protocol without intravenous contrast. Multiplanar CT image reconstructions of the cervical spine and maxillofacial structures were also generated. COMPARISON:  MRI brain 06/30/2019 and head CT from 2015. FINDINGS: CT HEAD FINDINGS Brain: Stable age related cerebral atrophy, ventriculomegaly and periventricular white matter disease. Scattered basal ganglia calcifications. No extra-axial fluid collections are identified. No CT findings for acute hemispheric infarction or intracranial hemorrhage. No mass lesions. The brainstem and cerebellum are normal. Vascular: Vascular calcifications but no aneurysm or hyperdense vessels. Skull: No skull fracture or bone lesions. Other: No scalp lesions or hematoma. CT MAXILLOFACIAL FINDINGS Osseous: Exam limited by patient motion but no definite facial bone fractures are identified. The mandibular condyles are normally located. No obvious mandible fracture. Orbits: The bony orbits are intact. Of the globes are intact. Sinuses: Patchy ethmoid sinus disease and partial opacification of the right half of the sphenoid sinus. The right maxillary sinus contains a mucous retention cyst or polyp. Soft tissues: No significant soft tissue findings. CT CERVICAL SPINE FINDINGS Alignment: Normal overall alignment. There is severe degenerative cervical spondylosis with multilevel disc disease and facet disease and mild multilevel degenerative subluxations. Skull base and vertebrae: Exam limited by motion artifact but no definite acute fractures. Soft  tissues and spinal canal: No prevertebral fluid or swelling. No visible canal hematoma. Disc levels: The spinal canal is patent. Mild multilevel spinal and foraminal stenosis but no large disc protrusions. Upper chest: Emphysematous changes are noted at the lung apices with dense scarring changes. Other: Carotid artery calcifications are noted. IMPRESSION: 1. Stable age related cerebral atrophy, ventriculomegaly and periventricular white matter disease. 2. No acute intracranial findings or skull fracture. 3. No definite acute facial bone fractures. 4. Severe degenerative cervical spondylosis with multilevel disc disease and facet disease but no definite acute cervical spine fracture. Electronically Signed   By: Marijo Sanes M.D.   On: 03/13/2020 10:09   CT KNEE LEFT WO CONTRAST  Result Date: 03/17/2020 CLINICAL DATA:  Left knee pain and swelling. EXAM: CT OF THE left KNEE WITHOUT CONTRAST TECHNIQUE: Multidetector CT imaging of the left knee was performed according to the standard protocol. Multiplanar CT image reconstructions were also generated. COMPARISON:  Radiographs 03/14/2020 FINDINGS: Moderate to advanced tricompartmental degenerative changes with joint space narrowing, osteophytic spurring, subchondral cystic change and bony eburnation. There is fairly extensive chondrocalcinosis, a large joint effusion and synovial calcifications and probable loose bodies in the joint. 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 No acute bony findings or worrisome bone lesions. There is a moderate-sized Baker's cyst noted. Moderate atrophy of the musculature. No muscle mass or acute myositis. Grossly by CT the quadriceps  and patellar tendons are intact. The ACL is not identified. The PCL is intact. The medial and lateral collateral ligaments are grossly intact. IMPRESSION: 1. 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 2. No acute bony findings or worrisome bone lesions. 3. Moderate atrophy of the musculature. 4. Poor visualization of the ACL. 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 Electronically Signed   By: Marijo Sanes M.D.   On: 03/17/2020 20:03   CT ABDOMEN PELVIS W CONTRAST  Addendum Date: 03/13/2020   ADDENDUM REPORT: 03/13/2020 19:50 ADDENDUM: Signs of interval left hip arthroplasty. Fluid about the arthroplasty may be postoperative given recent placement. Correlate with any clinical signs of infection. Electronically Signed   By: Zetta Bills M.D.   On: 03/13/2020 19:50   Result Date: 03/13/2020 CLINICAL DATA:  Small cell lung cancer assess treatment response. Also experience ground level fall, on Eliquis. EXAM: CT CHEST, ABDOMEN, AND PELVIS WITH CONTRAST TECHNIQUE: Multidetector CT imaging of the chest, abdomen and pelvis was performed following the standard protocol during bolus administration of intravenous contrast. CONTRAST:  161mL OMNIPAQUE IOHEXOL 300 MG/ML  SOLN COMPARISON:  01/23/2020 FINDINGS: CT CHEST FINDINGS Cardiovascular: Calcified and noncalcified atherosclerotic plaque throughout the aorta its branches in the chest. No aneurysmal dilation of thoracic aorta. Are stable in terms of mild dilation. Enlarging pericardial effusion, 2 cm thickness along the mid cardiac contour previously approximately 1 cm. Mediastinum/Nodes: Limited assessment of thoracic inlet structures due to motion artifact. No axillary lymphadenopathy. No hilar lymphadenopathy. No mediastinal lymphadenopathy. Esophagus grossly normal. Lungs/Pleura: Bronchial wall thickening at the left hilum with signs of lower lobe bronchiectasis, this is developed since the previous study. Small left-sided effusion is enlarged. Small right effusion with basilar volume loss. Multifocal areas of ground-glass nodularity within aerated portions of the chest and areas of part solid  nodules, similar to the prior study. (Image 47, series 4) 2.8 x 1.8 cm, previously 2.9 x 1.6 cm. Solid component appears similar. Ground-glass with increased soft tissue density with subpleural bandlike opacity that has developed amidst ground-glass changes in the right upper lobe (image 57, series 4) 1.7 by 0.7 cm, no solid-appearing abnormality was present in this location on the previous exam. Pure ground-glass lesion in the left upper lobe shows no solid component on today's study measuring approximately 2.4 cm in greatest axial dimension appearing more conspicuous but unchanged in terms of size. Areas of volume loss and opacity in the right lower lobe show increasing density along the superior aspect in the superior segment of the right lower lobe (image 78, series 4) 2.5 x 1.6 cm, this area previously measured approximately 2 point 2 by 1.2 cm. Background pulmonary emphysema. Musculoskeletal: No chest wall mass. See below for full musculoskeletal details. CT ABDOMEN PELVIS FINDINGS Hepatobiliary: Small hyperdense focus in the dome of the right hemi liver approximately cm unchanged likely a small hemangioma. Low-density focus along the margin of the inferior right hemi liver anteriorly (image 63, series 7) 8 mm, unchanged as is another area of low-density in hepatic subsegment VI measuring 15 mm. Pancreas: Pancreas is normal without focal lesion or ductal dilation. Spleen: Spleen is normal size without focal lesion. Adrenals/Urinary Tract: Adrenal glands with mild thickening of the right adrenal gland unchanged. Left adrenal gland is normal. Renal enhancement is symmetric with some scarring along the lateral cortex of the right kidney. Small low-density foci in the left kidney  are likely small cysts and unchanged. No hydronephrosis. Stomach/Bowel: No acute small bowel process. 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 Stool fills much of the colon. The appendix is normal.  Vascular/Lymphatic: Calcified and noncalcified plaque of the abdominal aorta without aneurysm. No abdominal adenopathy. No pelvic adenopathy. Reproductive: Prostate is small. Foley catheter in the urinary bladder. Other: Left lower quadrant colostomy. No free air. No ascites. Musculoskeletal: Signs of previous trauma to the right chest but with new right-sided rib fractures involving the right fourth, sixth, seventh and eighth ribs posteriorly. Acute fracture also of anterior right sixth rib. Sternum is intact. Costochondral elements are intact. Marked glenohumeral degenerative changes on the right with post operative changes of left shoulder arthroplasty as before. Signs of prior rib fracture on the left as well with similar appearance to the previous study. Multilevel spinal degenerative change. IMPRESSION: 1. Increased pericardial effusion. Echocardiography may be helpful given moderate to marked increase in size since previous examination. 2. Signs of previous trauma to the right chest but with new right-sided rib fractures involving the right fourth, sixth, seventh and eighth ribs posteriorly. 3. Increasing left effusion with bronchial wall thickening in the left lung base. Findings could be related to aspiration pneumonitis and are associated with airspace disease on today's study. 4. Increasing nodularity in the right chest. Correlate with any ongoing radiotherapy. Pneumonitis or worsening of disease is also considered. Short interval follow-up may be helpful as findings are of uncertain significance in the setting of trauma with right-sided rib fractures. 5. Areas also in the right lower lobe that warrant attention on follow-up, potentially related to posttraumatic changes or inflammation though disease worsening is also considered. 6. Small left-sided effusion is enlarged. Electronically Signed: By: Zetta Bills M.D. On: 03/13/2020 17:20   DG Hand 2 View Left  Result Date: 03/13/2020 CLINICAL DATA:   [place]. Thumb pain. EXAM: LEFT HAND - 2 VIEW COMPARISON:  None. 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 This is likely to be painful. Ordinary degenerative arthritis at the MCP joints of the thumb, index and long fingers. Mild inter phalangeal joint osteoarthritis. IMPRESSION: 1. 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 2. Ordinary degenerative arthritis of the MCP joints of the thumb, index and long fingers. Electronically Signed   By: Nelson Chimes M.D.   On: 03/13/2020 11:42   DG CHEST PORT 1 VIEW  Result Date: 03/26/2020 CLINICAL DATA:  Shortness of breath. EXAM: PORTABLE CHEST 1 VIEW COMPARISON:  Prior chest radiograph 02/24/2020, chest CT 01/23/2020 FINDINGS: Heart size within normal limits. Aortic atherosclerosis. Redemonstrated 3.1 cm right upper lobe pulmonary nodule. Please refer to prior chest CT 01/23/2020 for description of additional pulmonary nodules. Redemonstrated emphysema with scarring and architectural distortion. Ill-defined opacity at the bilateral lung bases may reflect atelectasis. Pneumonia cannot be excluded. On the left lateral costophrenic angle is not entirely included in the field of view. Within this limitation, no sizable pleural effusion is evident. No evidence of pneumothorax. Old right-sided rib fracture deformities. Chronic deformity of the proximal right humerus. IMPRESSION: Ill-defined opacity at the bilateral lung bases may reflect atelectasis. Pneumonia cannot be definitively excluded. Consider radiographic follow-up. Redemonstrated 3.1 cm right upper lobe pulmonary nodule in this patient with known pulmonary malignancy. Background pulmonary emphysema with scarring and architectural distortion. Aortic atherosclerosis. Electronically Signed   By: Kellie Simmering DO   On: 03/26/2020 14:17   DG CHEST PORT 1 VIEW  Result Date:  03/20/2020 CLINICAL DATA:  Dyspnea EXAM: PORTABLE  CHEST 1 VIEW COMPARISON:  03/14/2020 FINDINGS: Cardiac shadow is stable. Aortic calcifications are again seen. Persistent and increased left retrocardiac density is noted. No sizable effusion is noted. Old rib fractures are noted on the right. No definitive acute infiltrate is noted. Mild emphysematous changes are noted. IMPRESSION: Increased left retrocardiac density noted. Electronically Signed   By: Inez Catalina M.D.   On: 03/20/2020 15:04   DG HIP UNILAT WITH PELVIS 2-3 VIEWS LEFT  Result Date: 03/13/2020 CLINICAL DATA:  Pain following fall.  History of lung carcinoma EXAM: DG HIP (WITH OR WITHOUT PELVIS) 2-3V LEFT COMPARISON:  None. FINDINGS: Frontal pelvis as well as frontal and lateral left hip images were obtained. Bones are osteoporotic. 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 No acute fracture or dislocation. There is degenerative change in the lower lumbar spine. There is mild narrowing of the right hip joint. 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 No fracture or dislocation. 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 Electronically Signed   By: Lowella Grip III M.D.   On: 03/13/2020 10:35   ECHOCARDIOGRAM LIMITED  Result Date: 03/13/2020    ECHOCARDIOGRAM LIMITED REPORT   Patient Name:   ISAY PERLEBERG Date of Exam: 03/13/2020 Medical Rec #:  [id number]     Height:       71.0 in Accession #:    [id number]    Weight:       135.0 lb Date of Birth:  1941-01-08    BSA:          1.784 m Patient Age:    71 years      BP:           162/68 mmHg Patient Gender: M             HR:           97 bpm. Exam Location:  Inpatient Procedure: Limited Echo, Limited Color Doppler and Cardiac Doppler STAT ECHO Indications:    pericardial effusion  History:        Patient has no prior history of Echocardiogram examinations.                 Lung  cancer; Arrythmias:Atrial Fibrillation.  Sonographer:    Johny Chess Referring Phys: [place]  1. Moderate to large pericardial effusion. The pericardial effusion is circumferential. Slightly larger compared to the 01/08/20 study. Effusion measures 1.6-1.8 cm adjacent to the RV in diastole depending on where measured. There is 10% respiratory variation across the MV, not significant. Partial RA diastolic indentation without full collapse. No RV collapse. IVC is not visualized. Overall findings not consistent with tamponade physiology.  2. Of note appears patient has two medical record numbers in regards to finding prior data and studies. FINDINGS  Pericardium: Effusion measures 1.6-1.8 cm adjacent to the RV in diastole depending on where measured. There is 10% respiratory variation across the MV, not significant. Partial RA diastolic collapse. No RV collapse. IVC is not visualized. Moderate to large pericardial effusion. The pericardial effusion is circumferential. Carlyle Dolly MD Electronically signed by Carlyle Dolly MD Signature Date/Time: 03/13/2020/8:58:41 PM    Final    CT Maxillofacial WO CM  Result Date: 03/13/2020 CLINICAL DATA:  [place]. Facial trauma. EXAM: CT HEAD WITHOUT CONTRAST CT MAXILLOFACIAL WITHOUT CONTRAST CT CERVICAL SPINE WITHOUT  CONTRAST TECHNIQUE: Multidetector CT imaging of the head, cervical spine, and maxillofacial structures were performed using the standard protocol without intravenous contrast. Multiplanar CT image reconstructions of the cervical spine and maxillofacial structures were also generated. COMPARISON:  MRI brain 06/30/2019 and head CT from 2015. FINDINGS: CT HEAD FINDINGS Brain: Stable age related cerebral atrophy, ventriculomegaly and periventricular white matter disease. Scattered basal ganglia calcifications. No extra-axial fluid collections are identified. No CT findings for acute hemispheric infarction or intracranial hemorrhage. No mass  lesions. The brainstem and cerebellum are normal. Vascular: Vascular calcifications but no aneurysm or hyperdense vessels. Skull: No skull fracture or bone lesions. Other: No scalp lesions or hematoma. CT MAXILLOFACIAL FINDINGS Osseous: Exam limited by patient motion but no definite facial bone fractures are identified. The mandibular condyles are normally located. No obvious mandible fracture. Orbits: The bony orbits are intact. Of the globes are intact. Sinuses: Patchy ethmoid sinus disease and partial opacification of the right half of the sphenoid sinus. The right maxillary sinus contains a mucous retention cyst or polyp. Soft tissues: No significant soft tissue findings. CT CERVICAL SPINE FINDINGS Alignment: Normal overall alignment. There is severe degenerative cervical spondylosis with multilevel disc disease and facet disease and mild multilevel degenerative subluxations. Skull base and vertebrae: Exam limited by motion artifact but no definite acute fractures. Soft tissues and spinal canal: No prevertebral fluid or swelling. No visible canal hematoma. Disc levels: The spinal canal is patent. Mild multilevel spinal and foraminal stenosis but no large disc protrusions. Upper chest: Emphysematous changes are noted at the lung apices with dense scarring changes. Other: Carotid artery calcifications are noted. IMPRESSION: 1. Stable age related cerebral atrophy, ventriculomegaly and periventricular white matter disease. 2. No acute intracranial findings or skull fracture. 3. No definite acute facial bone fractures. 4. Severe degenerative cervical spondylosis with multilevel disc disease and facet disease but no definite acute cervical spine fracture. Electronically Signed   By: Marijo Sanes M.D.   On: 03/13/2020 10:09   DG Knee 3 Views Left  Result Date: 03/14/2020 CLINICAL DATA:  Left knee swelling, recent fall EXAM: LEFT KNEE - 3 VIEW COMPARISON:  None. FINDINGS: No evidence of acute fracture. No  dislocation. Moderate to severe tricompartmental osteoarthritis, most pronounced within the lateral compartment. Chondrocalcinosis noted. Small knee joint effusion. Mild suprapatellar soft tissue swelling. IMPRESSION: 1. No acute osseous abnormality. 2. Moderate to severe tricompartmental osteoarthritis. 3. Small knee joint effusion. Mild suprapatellar soft tissue swelling. Electronically Signed   By: Davina Poke D.O.   On: 03/14/2020 15:37    EKG EKG Interpretation  Date/Time:  Saturday Apr 04 2020 19:16:19 EDT Ventricular Rate:  80 PR Interval:    QRS Duration: 72 QT Interval:  357 QTC Calculation: 412 R Axis:   86 Text Interpretation: Sinus rhythm Atrial premature complex Nonspecific T wave abnormality Confirmed by Lajean Saver [phone]) on 04/04/2020 7:23:35 PM   Radiology No results found.  Procedures Procedures (including critical care time)  Medications Ordered in ED Medications - No data to display  ED Course  I have reviewed the triage vital signs and the nursing notes.  Pertinent labs & imaging results that were available during my care of the patient were reviewed by me and considered in my medical decision making (see chart for details).    MDM Rules/Calculators/A&P                     Iv ns. Stat labs.   Reviewed nursing notes and prior charts for  additional history.  Recent admission  - ? Possible pna then. No current fever or increased wob.   Contact wife for additional hx - she indicates no trauma/injury today. She is concerned he is becoming more depressed, and request mental health eval.  TOC team consulted.   [place] consulted.   2120, signed out to Dr Wilson Singer to check labs, f/u with [facility] eval, and dispo appropriately.     Final Clinical Impression(s) / ED Diagnoses Final diagnoses:  None    Rx / DC Orders ED Discharge Orders    None       Lajean Saver, MD 04/04/20 2122

## 2020-04-05 DIAGNOSIS — M2550 Pain in unspecified joint: Secondary | ICD-10-CM | POA: Diagnosis not present

## 2020-04-05 DIAGNOSIS — R0902 Hypoxemia: Secondary | ICD-10-CM | POA: Diagnosis not present

## 2020-04-05 DIAGNOSIS — I959 Hypotension, unspecified: Secondary | ICD-10-CM | POA: Diagnosis not present

## 2020-04-05 DIAGNOSIS — Z7189 Other specified counseling: Secondary | ICD-10-CM | POA: Diagnosis not present

## 2020-04-05 DIAGNOSIS — E860 Dehydration: Secondary | ICD-10-CM | POA: Diagnosis not present

## 2020-04-05 DIAGNOSIS — F0634 Mood disorder due to known physiological condition with mixed features: Secondary | ICD-10-CM | POA: Diagnosis not present

## 2020-04-05 DIAGNOSIS — Z515 Encounter for palliative care: Secondary | ICD-10-CM | POA: Insufficient documentation

## 2020-04-05 DIAGNOSIS — Z7401 Bed confinement status: Secondary | ICD-10-CM | POA: Diagnosis not present

## 2020-04-05 LAB — URINALYSIS, ROUTINE W REFLEX MICROSCOPIC
Bacteria, UA: NONE SEEN
Bilirubin Urine: NEGATIVE
Glucose, UA: NEGATIVE mg/dL
Ketones, ur: NEGATIVE mg/dL
Nitrite: NEGATIVE
Protein, ur: NEGATIVE mg/dL
Specific Gravity, Urine: 1.02 (ref 1.005–1.030)
pH: 6 (ref 5.0–8.0)

## 2020-04-05 LAB — RESPIRATORY PANEL BY RT PCR (FLU A&B, COVID)
Influenza A by PCR: NEGATIVE
Influenza B by PCR: NEGATIVE
SARS Coronavirus 2 by RT PCR: NEGATIVE

## 2020-04-05 MED ORDER — DILTIAZEM HCL ER COATED BEADS 180 MG PO CP24
180.0000 mg | ORAL_CAPSULE | Freq: Every day | ORAL | Status: DC
  Administered 2020-04-05: 180 mg via ORAL
  Filled 2020-04-05: qty 1

## 2020-04-05 MED ORDER — DEMECLOCYCLINE HCL 150 MG PO TABS
300.0000 mg | ORAL_TABLET | Freq: Two times a day (BID) | ORAL | Status: DC
  Filled 2020-04-05: qty 2

## 2020-04-05 MED ORDER — ACETAMINOPHEN 325 MG PO TABS: 650.0000 mg | ORAL_TABLET | Freq: Four times a day (QID) | ORAL | Status: DC | PRN

## 2020-04-05 MED ORDER — ONDANSETRON HCL 4 MG/5ML PO SOLN
4.0000 mg | Freq: Four times a day (QID) | ORAL | Status: DC | PRN
  Administered 2020-04-05: 4 mg via ORAL
  Filled 2020-04-05: qty 5

## 2020-04-05 MED ORDER — TRAZODONE HCL 100 MG PO TABS
100.0000 mg | ORAL_TABLET | Freq: Every day | ORAL | Status: DC
  Administered 2020-04-05: 100 mg via ORAL
  Filled 2020-04-05: qty 1

## 2020-04-05 MED ORDER — ONDANSETRON HCL 4 MG/5ML PO SOLN: 4.0000 mg | Freq: Four times a day (QID) | ORAL | Status: DC

## 2020-04-05 MED ORDER — COLCHICINE 0.6 MG PO TABS
0.6000 mg | ORAL_TABLET | Freq: Every day | ORAL | Status: DC
  Administered 2020-04-05: 0.6 mg via ORAL
  Filled 2020-04-05: qty 1

## 2020-04-05 MED ORDER — POLYETHYLENE GLYCOL 3350 17 G PO PACK: 17.0000 g | PACK | Freq: Every day | ORAL | Status: DC | PRN

## 2020-04-05 MED ORDER — MELATONIN 3 MG PO TABS
3.0000 mg | ORAL_TABLET | Freq: Every day | ORAL | Status: DC
  Administered 2020-04-05: 3 mg via ORAL
  Filled 2020-04-05: qty 1

## 2020-04-05 MED ORDER — SACCHAROMYCES BOULARDII 250 MG PO CAPS
250.0000 mg | ORAL_CAPSULE | Freq: Two times a day (BID) | ORAL | Status: DC
  Filled 2020-04-05: qty 1

## 2020-04-05 MED ORDER — APIXABAN 5 MG PO TABS
5.0000 mg | ORAL_TABLET | Freq: Two times a day (BID) | ORAL | Status: DC
  Filled 2020-04-05: qty 1

## 2020-04-05 MED ORDER — LORAZEPAM 2 MG/ML PO CONC: 1.0000 mg | ORAL | Status: DC | PRN

## 2020-04-05 MED ORDER — LORAZEPAM 2 MG/ML IJ SOLN: 0.5000 mg | INTRAMUSCULAR | Status: DC | PRN

## 2020-04-05 MED ORDER — HYPROMELLOSE (GONIOSCOPIC) 2.5 % OP SOLN: 1.0000 [drp] | Freq: Four times a day (QID) | OPHTHALMIC | Status: DC | PRN

## 2020-04-05 MED ORDER — ALBUTEROL SULFATE HFA 108 (90 BASE) MCG/ACT IN AERS: 1.0000 | INHALATION_SPRAY | Freq: Four times a day (QID) | RESPIRATORY_TRACT | Status: DC | PRN

## 2020-04-05 MED ORDER — TAMSULOSIN HCL 0.4 MG PO CAPS
0.4000 mg | ORAL_CAPSULE | Freq: Two times a day (BID) | ORAL | Status: DC
  Administered 2020-04-05: 0.4 mg via ORAL
  Filled 2020-04-05: qty 1

## 2020-04-05 MED ORDER — MORPHINE SULFATE 10 MG/5ML PO SOLN
2.5000 mg | ORAL | Status: DC | PRN
  Administered 2020-04-05: 2.5 mg via ORAL
  Filled 2020-04-05: qty 2

## 2020-04-05 MED ORDER — MENTHOL 3 MG MT LOZG: 1.0000 | LOZENGE | OROMUCOSAL | Status: DC | PRN

## 2020-04-05 MED ORDER — PREDNISONE 20 MG PO TABS: 10.0000 mg | ORAL_TABLET | ORAL | Status: DC

## 2020-04-05 MED ORDER — ESCITALOPRAM OXALATE 10 MG PO TABS
10.0000 mg | ORAL_TABLET | Freq: Every day | ORAL | Status: DC
  Administered 2020-04-05: 10 mg via ORAL
  Filled 2020-04-05: qty 1

## 2020-04-05 MED ORDER — HYDROCODONE-ACETAMINOPHEN 5-325 MG PO TABS: 1.0000 | ORAL_TABLET | Freq: Four times a day (QID) | ORAL | Status: DC | PRN

## 2020-04-05 MED ORDER — BISACODYL 10 MG RE SUPP: 10.0000 mg | Freq: Every day | RECTAL | Status: DC | PRN

## 2020-04-05 MED ORDER — BIOTENE DRY MOUTH MT LIQD: 15.0000 mL | OROMUCOSAL | Status: DC | PRN

## 2020-04-05 MED ORDER — POTASSIUM CHLORIDE CRYS ER 20 MEQ PO TBCR
20.0000 meq | EXTENDED_RELEASE_TABLET | Freq: Two times a day (BID) | ORAL | Status: DC
  Administered 2020-04-05: 20 meq via ORAL
  Filled 2020-04-05: qty 1

## 2020-04-05 MED ORDER — QUETIAPINE FUMARATE 25 MG PO TABS
25.0000 mg | ORAL_TABLET | Freq: Every day | ORAL | Status: DC
  Administered 2020-04-05: 25 mg via ORAL
  Filled 2020-04-05: qty 1

## 2020-04-05 MED ORDER — ALPRAZOLAM 0.25 MG PO TABS: 0.2500 mg | ORAL_TABLET | Freq: Two times a day (BID) | ORAL | Status: DC | PRN

## 2020-04-05 MED ORDER — ADULT MULTIVITAMIN W/MINERALS CH: 1.0000 | ORAL_TABLET | Freq: Every day | ORAL | Status: DC

## 2020-04-05 MED ORDER — MAGIC MOUTHWASH
10.0000 mL | Freq: Four times a day (QID) | ORAL | Status: DC
  Filled 2020-04-05 (×2): qty 10

## 2020-04-05 MED ORDER — MEGESTROL ACETATE 400 MG/10ML PO SUSP
400.0000 mg | Freq: Two times a day (BID) | ORAL | Status: DC
  Filled 2020-04-05: qty 10

## 2020-04-05 MED ORDER — DM-GUAIFENESIN ER 30-600 MG PO TB12
1.0000 | ORAL_TABLET | Freq: Two times a day (BID) | ORAL | Status: DC | PRN
  Administered 2020-04-05: 1 via ORAL
  Filled 2020-04-05 (×2): qty 1

## 2020-04-05 MED ORDER — SALINE SPRAY 0.65 % NA SOLN: 1.0000 | NASAL | Status: DC | PRN

## 2020-04-05 MED ORDER — LEVOTHYROXINE SODIUM 50 MCG PO TABS: 50.0000 ug | ORAL_TABLET | Freq: Every day | ORAL | Status: DC

## 2020-04-05 MED ORDER — BISACODYL 5 MG PO TBEC: 5.0000 mg | DELAYED_RELEASE_TABLET | Freq: Every day | ORAL | Status: DC | PRN

## 2020-04-05 MED ORDER — ENSURE ENLIVE PO LIQD
237.0000 mL | Freq: Two times a day (BID) | ORAL | Status: DC
  Filled 2020-04-05: qty 237

## 2020-04-05 NOTE — ED Notes (Signed)
ED Provider at bedside. 

## 2020-04-05 NOTE — BH Assessment (Signed)
Tele Assessment Note   Patient Name: Anthony Curry. MRN: [id number] Referring Physician: Dr. Lajean Saver, MD Location of Patient: [facility] Location of Provider: Behavioral Health TTS Department  [name]. is an 79 y.o. male who was brought to [facility] via EMS due to pt collapsing while he was walking while he was in his personal home that he shares with his spouse. 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 He was evaluated by a psychologist on 03/30/2020 who determined that pt was safe for discharge and was not experiencing SI, HI, or AVH. At the time of admission tonight, pt's wife expressed concern that pt had been making comments about wanting to die and his wife requested pt have a mental health evaluation, which was ordered.  Pt states he is at the hospital because, "I fell down." Clinician inquired what she can do for pt, and he requested, "Don't kill me." He also requested clinician give him a shot so he can sleep, as he stated that he has been told by others that he has been sleeping at night, though every morning he does not remember/feel as if he has slept at all and is experiencing severe fatigue. Pt states numerous times to clinician, "don't lie to me," despite clinician explaining to pt that she has no reason to lie to him.  Pt asked why he was brought to the room he was currently in, stating that he was in a nice room earlier, and expressed concerned feelings that he was brought to this room to be killed or to die. Clinician stated his wife had reported that he had expressed to his wife some concerning thoughts about wanting to die, and since she loved him she was concerned. Pt stated he shouldn't have said the things he said, and said he doesn't want to die, and for clinician to please to tell his wife that he doesn't mean the things he said. Clinician said that she would contact his wife shortly and express that sentiment. Pt again asked clinician why she was lying to him, and  clinician again stated that she wasn't.  Clinician explained that she had three questions to ask of pt and then she was going to let him rest; she inquired if pt was experiencing thoughts of wanting to harm or kill himself, to which he denied. She inquired if pt was experiencing thoughts of wanting to harm or kill others, to which he denied. She inquired as to whether pt has been experiencing hallucinations, or seeing or hearing things that others weren't seeing or hearing, to which he denied. Clinician stated she was going to talk to the nurse practitioner and then speak to pt's wife and that we could see what we could do to help pt. Pt again asked why clinician was lying to him, and clinician again reassured pt that she has no reason to lie to him but that she was ok with pt thinking that she was lying to him. Clinician stated she was going to contact pt's wife, with his consent, and pt expressed that he was fine with this and again requested that clinician tell his wife that he didn't mean it when he said that he wanted to die, as he doesn't want to die.  Pt's protective factors include that pt denies SI, HI, and AVH.  Pt provided verbal consent to contact his wife, Tajuan Dufault, whom clinician contacted at [phone]. Pt's wife shared that she has been worried about her spouse due  to pt making statements about wanting to die and not eating today. She and clinician discussed how pt could benefit from pt, and even her, having someone to talk to, and clinician stated she would fax over information for them to get counseling services, if she and pt were interested. Clinician stated she would have a nurse contact pt's wife when pt is ready for d/c and explain specifically where pt's wife can park as close to the hospital as possible for ease of picking pt up; pt's wife expressed appreciation. Clinician encouraged pt's wife to contact the hospital or [facility] if she has needs/concerns in the future, and pt's wife agreed  to do so.  Pt's orientation was UTA. Pt's memory was UTA. Pt was somewhat cooperative throughout the assessment process. Pt's insight, judgement, and impulse control are fair at this time.   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 Adrenal mass ([place])   . Alcoholism in remission ([place])   . Anemia of chronic disease   . Ankle fracture    right  . 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 Anxiety   . Atrial fibrillation, chronic ([place])   . BPH (benign prostatic hyperplasia)   . Cancer ([place])    small cell/lung  . Complication of anesthesia 09/08/2015    JITTERY AFTER GALLBLADDER SURGERY  . COVID-19 12/2019  . Depression   . Epididymoorchitis 01/30/2020   with right hydrocele  . Femur fracture ([place])   . GERD (gastroesophageal reflux disease)   . History of radiation therapy 11/14/16, 11/18/16, 11/22/16   Left upper lung: 54 Gy in 3 fractions  . Hypertension   . Hyponatremia   . Multiple falls   . Neoplastic malignant related fatigue    IV tx. every 2 weeks last9-14-16 (Dr. Earlie Server), radiation last tx. 6 months  . 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 Perforated diverticulum   . Pericardial effusion   . Persistent atrial fibrillation ([place])   . Protein calorie malnutrition ([place])   . Pulmonary nodules   . Radiation 12/02/14-12/16/14   Left adrenal gland 30 Gy in 10 fractions  . Stage 4 lung cancer, unspecified laterality ([place])   . Urinary retention     Past Surgical History:  Procedure Laterality Date  . ANKLE ARTHRODESIS  1995   rt fx-hardware in  . CARDIOVERSION N/A 09/29/2014   Procedure: CARDIOVERSION;  Surgeon: Josue Hector, MD;  Location[facility] ENDOSCOPY;  Service: Cardiovascular;  Laterality: N/A;  . CHOLECYSTECTOMY  09/08/2015    laproscopic   . CHOLECYSTECTOMY N/A 09/08/2015   Procedure:  LAPAROSCOPIC CHOLECYSTECTOMY WITH ATTEMPTED INTRAOPERATIVE CHOLANGIOGRAM ;  Surgeon: Fanny Skates, MD;  Location: [place];  Service: General;  Laterality: N/A;  . COLON SURGERY     sigmoid colectomy with colostomy for ruptured diverticulum  . COLONOSCOPY    . EMBOLECTOMY Left 01/06/2020   Procedure: Embolectomy left axillary, brachial, radial, and ulnar artery;  Surgeon: Elam Dutch, MD;  Location: [facility] OR;  Service: Vascular;  Laterality: Left;  . ERCP N/A 09/02/2015   Procedure: ENDOSCOPIC RETROGRADE CHOLANGIOPANCREATOGRAPHY (ERCP);  Surgeon: Arta Silence, MD;  Location: [facility];  Service: Endoscopy;  Laterality: N/A;  . ERCP N/A 09/07/2015   Procedure: ENDOSCOPIC RETROGRADE CHOLANGIOPANCREATOGRAPHY (ERCP);  Surgeon: Clarene Essex, MD;  Location: [facility] ENDOSCOPY;  Service: Endoscopy;  Laterality: N/A;  . EXCISION / CURETTAGE BONE CYST  FINGER  2005   rt thumb  . hemiarthroplasty Left 02/2020  . HEMORRHOID SURGERY    . HIP ARTHROPLASTY Left 02/18/2020   Procedure: LEFT HIP HEMIARTHROPLASTY;  Surgeon: Marchia Bond, MD;  Location: [facility] ORS;  Service: Orthopedics;  Laterality: Left;  . LAPAROTOMY N/A 01/05/2020   Procedure: EXPLORATORY LAPAROTOMY sigmoid colon resection creation end colostomy for perforated diverticulitis and purulent peritonitis;  Surgeon: Kieth Brightly Arta Bruce, MD;  Location: [facility] ORS;  Service: General;  Laterality: N/A;  . Percutaneous Cholecytostomy tube     IVR insertion 06-13-15(Dr. Vernard Gambles)- 08-28-15 remains in place to drainage bag  . TONSILLECTOMY    . TOTAL SHOULDER ARTHROPLASTY  02/03/2012   Procedure: TOTAL SHOULDER ARTHROPLASTY;  Surgeon: [name], MD;  Location: [place];  Service: Orthopedics;  Laterality: Left;    Family History:  Family History  Problem Relation Age of Onset  . Lung cancer Mother   . Heart attack Father   . Brain cancer Brother   . Hypertension Father   . Diabetes Paternal Grandmother   . Heart attack Brother   .  Neuropathy Neg Hx     Social History:  reports that he quit smoking about 29 years ago. His smoking use included cigarettes. He has a 30.00 pack-year smoking history. He has never used smokeless tobacco. He reports previous alcohol use. He reports that he does not use drugs.  Additional Social History:  Alcohol / Drug Use Pain Medications: Please see MAR Prescriptions: Please see MAR Over the Counter: Please see MAR History of alcohol / drug use?: (UTA) Longest period of sobriety (when/how long): UTA  CIWA: CIWA-Ar BP: (!) 152/62 Pulse Rate: 91 COWS:    Allergies:  Allergies  Allergen Reactions  . Aspirin Other (See Comments)    Cannot consume as a result of him taking Eliquis (Blood thinner)    Home Medications: (Not in a hospital admission)   OB/GYN Status:  No LMP for male patient.  General Assessment Data Assessment unable to be completed: Yes Reason for not completing assessment: Pt is not yet medically cleared Location of Assessment: [facility] ED TTS Assessment: In system Is this a Tele or Face-to-Face Assessment?: Tele Assessment Is this an Initial Assessment or a Re-assessment for this encounter?: Initial Assessment Patient Accompanied by:: N/A Language Other than English: No Living Arrangements: Other (Comment)(Pt lives with his wife) What gender do you identify as?: Male Marital status: Married Living Arrangements: Spouse/significant other Can pt return to current living arrangement?: Yes Admission Status: Voluntary Is patient capable of signing voluntary admission?: Yes Referral Source: Self/Family/Friend Insurance type: Medicare     Crisis Care Plan Living Arrangements: Spouse/significant other Legal Guardian: Other:(Self) Name of Psychiatrist: [place] Name of Therapist: UTA  Education Status Is patient currently in school?: No Is the patient employed, unemployed or receiving disability?: (UTA)  Risk to self with the past 6 months Suicidal Ideation:  Yes-Currently Present Has patient been a risk to self within the past 6 months prior to admission? : No Suicidal Intent: No Has patient had any suicidal intent within the past 6 months prior to admission? : No Is patient at risk for suicide?: No Suicidal Plan?: No Has patient had any suicidal plan within the past 6 months prior to admission? : No Access to Means: No What has been your use of drugs/alcohol within the last 12 months?: UTA Previous Attempts/Gestures: (UTA) How many times?: (UTA) Other Self Harm Risks: Pt has had periods of not eating Triggers for Past  Attempts: Unknown Intentional Self Injurious Behavior: (UTA) Family Suicide History: Unable to assess Recent stressful life event(s): Loss (Comment), Recent negative physical changes, Turmoil (Comment)(Pt is currently dying from cancer) Persecutory voices/beliefs?: No Depression: Yes Depression Symptoms: Despondent, Insomnia, Fatigue, Guilt, Loss of interest in usual pleasures, Feeling worthless/self pity, Feeling angry/irritable Substance abuse history and/or treatment for substance abuse?: (UTA) Suicide prevention information given to non-admitted patients: Yes  Risk to Others within the past 6 months Homicidal Ideation: No Does patient have any lifetime risk of violence toward others beyond the six months prior to admission? : Unknown Thoughts of Harm to Others: No Current Homicidal Intent: No Current Homicidal Plan: No Access to Homicidal Means: No Identified Victim: None noted History of harm to others?: (UTA) Assessment of Violence: None Noted Does patient have access to weapons?: (UTA) Criminal Charges Pending?: No Does patient have a court date: No Is patient on probation?: No  Psychosis Hallucinations: Auditory, Visual Delusions: None noted  Mental Status Report Appearance/Hygiene: In scrubs, Other (Comment)(Pt is jaundiced) Eye Contact: Fair Motor Activity: Freedom of movement, Other (Comment)(Pt was  lying in his hospital bed, only moved his head, eyes) Speech: Soft, Slow Level of Consciousness: Quiet/awake, Irritable Mood: Depressed, Irritable Affect: Appropriate to circumstance, Irritable Anxiety Level: Minimal Thought Processes: Circumstantial, Coherent Judgement: Partial Orientation: Unable to assess Obsessive Compulsive Thoughts/Behaviors: Minimal  Cognitive Functioning Concentration: Normal Memory: Unable to Assess Is patient IDD: No Insight: Fair Impulse Control: Fair Appetite: (UTA - pt hasn't been eating due to illness) Have you had any weight changes? : Loss(Due to end-stage cancer) Amount of the weight change? (lbs): (UTA, loss due to end-stage cancer) Sleep: Unable to Assess Total Hours of Sleep: (UTA - pt states he does not feel like he sleeps) Vegetative Symptoms: (UTA - pt has end-stage cancer)  ADLScreening [facility] Assessment Services) Patient's cognitive ability adequate to safely complete daily activities?: Yes Patient able to express need for assistance with ADLs?: Yes Independently performs ADLs?: No  Prior Inpatient Therapy Prior Inpatient Therapy: (UTA)  Prior Outpatient Therapy Prior Outpatient Therapy: (UTA)  ADL Screening (condition at time of admission) Patient's cognitive ability adequate to safely complete daily activities?: Yes Is the patient deaf or have difficulty hearing?: No Does the patient have difficulty seeing, even when wearing glasses/contacts?: No Does the patient have difficulty concentrating, remembering, or making decisions?: No Patient able to express need for assistance with ADLs?: Yes Does the patient have difficulty dressing or bathing?: Yes Independently performs ADLs?: No Communication: Independent Dressing (OT): Needs assistance Is this a change from baseline?: Pre-admission baseline Grooming: Needs assistance Is this a change from baseline?: Pre-admission baseline Feeding: Independent Bathing: Needs assistance Is  this a change from baseline?: Pre-admission baseline Toileting: Needs assistance Is this a change from baseline?: Pre-admission baseline In/Out Bed: Needs assistance Is this a change from baseline?: Pre-admission baseline Walks in Home: Needs assistance Is this a change from baseline?: Pre-admission baseline Does the patient have difficulty walking or climbing stairs?: Yes Weakness of Legs: Both Weakness of Arms/Hands: None  Home Assistive Devices/Equipment Home Assistive Devices/Equipment: (UTA)  Therapy Consults (therapy consults require a physician order) PT Evaluation Needed: (UTA) OT Evalulation Needed: (UTA) SLP Evaluation Needed: (UTA) Abuse/Neglect Assessment (Assessment to be complete while patient is alone) Abuse/Neglect Assessment Can Be Completed: Unable to assess, patient is non-responsive or altered mental status(UTA) Physical Abuse: (UTA) Verbal Abuse: (UTA) Sexual Abuse: (UTA) Exploitation of patient/patient's resources: (UTA) Self-Neglect: (UTA) Values / Beliefs Cultural Requests During Hospitalization: (UTA) Spiritual Requests During  Hospitalization: (UTA) [place] Needed: (UTA) Transition of Care Team Consult Needed: (UTA) Advance Directives (For Healthcare) Does Patient Have a Medical Advance Directive?: Yes Does patient want to make changes to medical advance directive?: No - Patient declined          Disposition: Lindon Romp, NP, reviewed pt's chart and information and determined pt cn be psych cleared at this time. This information was provided to pt's wife at 2335, to pt's provider, Quincy Carnes, [place], at [phone], and to pt's nurse, Beckie Busing RN, at [phone]. Clinician requested pt's nurse contact pt's wife for d/c instructions when pt is ready for d/c and Monique RN expressed an understanding. Counseling information was faxed to pt's unit and confirmed successfully received at [place] on 04/05/2020.   Disposition Initial Assessment  Completed for this Encounter: Yes Patient referred to: Other (Comment)(Pt has been psych cleared to return home)  This service was provided via telemedicine using a 2-way, interactive audio and video technology.  Names of all persons participating in this telemedicine service and their role in this encounter. Name: Dia Sitter Role: Patient  Name: Marion Downer Role: Patient's Wife  Name: Lindon Romp Role: Nurse Practitioner  Name: Windell Hummingbird Role: Clinician    Dannielle Burn 04/05/2020 12:35 AM

## 2020-04-05 NOTE — ED Provider Notes (Signed)
1:22 AM  Asked by nursing staff to reevaluate patient.  He is up for discharge.  Patient complaining of central chest pain that he is unable to describe and states that it lasted for a few seconds and then resolved.  EKG reviewed/interpreted and shows no ischemic change.  Seems very atypical for ACS, PE, dissection.  Vital signs within normal limits.    On review of his records, it appears he has history of stage IV lung cancer, sigmoid colectomy with colostomy due to perforated diverticulitis, COVID-19 pneumonia, fall with left hip hemiarthroplasty.  Was sent here for mental health evaluation due to passive suicidality.  Cleared by psychiatry.  On reevaluation, patient states that he generally does not feel well and feels weak.  He reports that he is worried he is going to fall even using his walker and states he has had falls while walking using the walker.  He states "I put my wife through hell".  He states he had a Education officer, museum that was helping set up home health services at home but states "I have messed that all up".  He states he does not feel that he and his wife can care for him at home and is requesting nursing home placement.  He may also qualify for hospice placement given his multiple conditions.  He is refusing to try to get up and walk at this time.  He is repeatedly refusing to be discharged home.  Will place case management, social work, physical therapy consults.  I reviewed all nursing notes and pertinent previous records as available.  I have reviewed and interpreted any EKGs, lab and urine results, imaging (as available).    Anthony Curry, Delice Bison, DO 04/05/20 [phone]

## 2020-04-05 NOTE — ED Notes (Addendum)
[facility] to transport pt home - Pt unable to sign signature pad. D/C instructions given to PTAR. Pt voiced he has all of his belongings. F/C intact and colostomy present.

## 2020-04-05 NOTE — ED Notes (Addendum)
Message sent to Tacey Ruiz, NP, per request of Hospice RN - pt requesting med for sleep. PTAR notified of need for transport. Pt aware. Pt noted w/foley catheter and colostomy bag. IV removed from right AC.

## 2020-04-05 NOTE — ED Notes (Signed)
Spouse has arrived to visit. PT w/pt.

## 2020-04-05 NOTE — ED Notes (Signed)
Hospice RN w/pt. Spouse left a minutes ago.

## 2020-04-05 NOTE — ED Notes (Signed)
CM, SW, PT  bfast ordered

## 2020-04-05 NOTE — Progress Notes (Signed)
CSW acknowledges consult for SNF work-up. CSW discussed with RN and will reach out to Palliative and then to the family regarding treatment options and goals of care .

## 2020-04-05 NOTE — ED Notes (Signed)
Per PT and SW, pt needs Palliative Consult.

## 2020-04-05 NOTE — ED Provider Notes (Signed)
Patient has been seen by seen by [name] team, and palliative medicine team, [facility] team, and they indicate patient is psych stable/clear for d/c. Home health palliative care/hospice resources have been put into place. Pt/spouse agreeable with plan for d/c to home this AM.   Pt alert, content, appearing no distress or acute discomfort.   Pt currently appears stable for d/c.      Lajean Saver, MD 04/05/20 1144

## 2020-04-05 NOTE — ED Notes (Signed)
RN was in the process of discharging patient when he begin beating on his left chest; pt c/o of new onset chest pain 6/10 on the left side; pt states pain is new; Agricultural consultant notified and Grandson who has been waiting in parking lot was made aware; Patient states he is afraid to go home because he can not walk and he does not feel his wife can take care of him; Patient states he does not want to go to rehab; when RN asked patient where would he like to go he states "I don't know"; Patient states he came from home tonight but had previously been in rehab; RN attempted to draw CP labs but was unsuccessful; Patient will be moved to medical side momentarily; Patient's vitals are stable at this time; Patient does have a cough noted by this RN-Monique,RN

## 2020-04-05 NOTE — ED Notes (Signed)
RN spoke with Grandson regarding plan for tonight; Patient is asking for nursing home placement and to be able to sleep in hospital for the night; EDP has assess patient and placed SW consult and PT/OT evaluation for the AM; Further information will be provided to family in the AM after SW has reviewed his case; pt is stable at this time and has asked to let him get some sleep; Patient given warm blankets and light turned off; Door has been closed for comfort but blinds open for staff to view; Call light was given to [facility]

## 2020-04-05 NOTE — Progress Notes (Signed)
CSW met with patient and palliative care NP Ferolito regarding goals of care and disposition. CSW notes at this time patient and spouse requests home hospice care for patient. CSW and palliative care NP discussed with family home hospice services and needs for family home hospice care. CSW received verbal consent from patient and spouse to contact [facility] liaison to arrange services.   CSW reached out to Audrea Muscat RN with [facility] to initiate referral for services. CSW notes liaison reports she will meet with the patient in the next 1-1.5 hours to discuss home hospice and discharge timeline.   CSW will continue to follow for any discharge supports.   Daphine Deutscher, LCSW, [place] Social Worker II Emergency Department, [place], [place] [phone]

## 2020-04-05 NOTE — Evaluation (Signed)
Physical Therapy Evaluation Patient Details Name: Anthony Curry. MRN: [id number] DOB: 1941-10-30 Today's Date: 04/05/2020   History of Present Illness  Pt is an 79 y/o male presenting to the ED secondary to weakness and dizziness from fall. Found to be hypotensive. PMH includes lung cancer, s/p ostomy, COVID, HTN, a fib, multiple falls, recent rib fxs, hip fx.   Clinical Impression  Pt admitted secondary to problem above with deficits below. Pt very dizzy upon sitting at EOB, with no improvement, so mobility limited. Unable to tolerate OOB mobility. Pt also very fearful of falling. Pt's wife present during session. Discussed follow up PT options, and pt's wife would like to discuss with daughter before deciding. Do feel pt would benefit from palliative consult if able to be seen in ED. Will continue to follow acutely to maximize functional mobility independence and safety.     Follow Up Recommendations Supervision/Assistance - 24 hour(HHPT vs SNF vs palliative follow up )    Equipment Recommendations  None recommended by PT    Recommendations for Other Services Other (comment)(palliative consult )     Precautions / Restrictions Precautions Precautions: Fall Precaution Comments: ostomy, foley Restrictions Weight Bearing Restrictions: No Other Position/Activity Restrictions: Pt w/ L hemiarthoplasty 02/18/20, posterior hip precautions      Mobility  Bed Mobility Overal bed mobility: Needs Assistance Bed Mobility: Supine to Sit;Sit to Supine     Supine to sit: Min guard Sit to supine: Supervision   General bed mobility comments: Min gurd to supervision for safety throughout bed mobility. Required use of bed rails. Upon sitting, pt with increased dizziness that did not improve and was unable to tolerate further mobility.   Transfers                    Ambulation/Gait                Stairs            Wheelchair Mobility    Modified Rankin (Stroke  Patients Only)       Balance Overall balance assessment: Needs assistance Sitting-balance support: Feet supported;No upper extremity supported Sitting balance-Leahy Scale: Fair                                       Pertinent Vitals/Pain Pain Assessment: Faces Faces Pain Scale: No hurt    Home Living Family/patient expects to be discharged to:: Private residence Living Arrangements: Spouse/significant other Available Help at Discharge: Family;Available 24 hours/day Type of Home: Apartment Home Access: Elevator     Home Layout: One level Home Equipment: Walker - 2 wheels;Shower seat;Transport chair      Prior Function Level of Independence: Needs assistance   Gait / Transfers Assistance Needed: Had just gotten out of CIR, however, reports he could ambulate 100-150' with RW. Pt's wife had to break his fall at home.   ADL's / Homemaking Assistance Needed: Needs assist.         Hand Dominance        Extremity/Trunk Assessment   Upper Extremity Assessment Upper Extremity Assessment: Generalized weakness    Lower Extremity Assessment Lower Extremity Assessment: Generalized weakness    Cervical / Trunk Assessment Cervical / Trunk Assessment: Kyphotic  Communication   Communication: No difficulties  Cognition Arousal/Alertness: Awake/alert Behavior During Therapy: WFL for tasks assessed/performed;Flat affect Overall Cognitive Status: Within Functional Limits for tasks assessed  General Comments General comments (skin integrity, edema, etc.): Pt's wife in room     Exercises     Assessment/Plan    PT Assessment Patient needs continued PT services  PT Problem List Decreased strength;Decreased range of motion;Decreased activity tolerance;Decreased balance;Decreased mobility;Decreased coordination;Decreased knowledge of use of DME;Decreased knowledge of precautions;Cardiopulmonary status  limiting activity;Pain       PT Treatment Interventions DME instruction;Gait training;Functional mobility training;Therapeutic activities;Therapeutic exercise;Balance training;Patient/family education;Wheelchair mobility training    PT Goals (Current goals can be found in the Care Plan section)  Acute Rehab PT Goals Patient Stated Goal: to feel better PT Goal Formulation: With patient Time For Goal Achievement: 04/19/20 Potential to Achieve Goals: Good    Frequency Min 2X/week   Barriers to discharge        Co-evaluation               AM-PAC PT "6 Clicks" Mobility  Outcome Measure Help needed turning from your back to your side while in a flat bed without using bedrails?: A Little Help needed moving from lying on your back to sitting on the side of a flat bed without using bedrails?: A Little Help needed moving to and from a bed to a chair (including a wheelchair)?: A Lot Help needed standing up from a chair using your arms (e.g., wheelchair or bedside chair)?: A Lot Help needed to walk in hospital room?: A Lot Help needed climbing 3-5 steps with a railing? : A Lot 6 Click Score: 14    End of Session   Activity Tolerance: Patient limited by fatigue;Treatment limited secondary to medical complications (Comment)(dizziness ) Patient left: in bed;with call bell/phone within reach;with family/visitor present Nurse Communication: Mobility status;Other (comment)(possible palliative consult?) PT Visit Diagnosis: Difficulty in walking, not elsewhere classified (R26.2);Other abnormalities of gait and mobility (R26.89);History of falling (Z91.81)    Time: 5277-8242 PT Time Calculation (min) (ACUTE ONLY): 20 min   Charges:   PT Evaluation $PT Eval Moderate Complexity: 1 Mod          Reuel Derby, PT, DPT  Acute Rehabilitation Services  Pager: [phone] Office: [phone]   Rudean Hitt 04/05/2020, 8:40 AM

## 2020-04-05 NOTE — Consult Note (Signed)
Palliative Medicine Inpatient Consult Note  Reason for consult:  Stg IV Lung CA with worsening decline  HPI:  Per intake H&P --> Anthony Curry is an 79 y.o. 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 He has had a decline in his home, is not eating or drinking. Palliative care was asked to meet with him to address goals of care.   Clinical Assessment/Goals of Care: I have reviewed medical records including EPIC notes, labs and imaging, received report from bedside RN, assessed the patient.   I met with Anthony Curry and his wife, Anthony Curry, and MSW [name], to furtherdiscuss diagnosis prognosis, GOC, EOL wishes, disposition and options.  I introduced Palliative Medicine as specialized medical care for people living with serious illness. It focuses on providing relief from the symptoms and stress of a serious illness. The goal is to improve quality of life for both the patient and the family.  I asked Anthony Curry to tell me about himself.  Anthony Curry states that he is from [place]. He went into the [employer] where he served for fours years. He met his wife, Anthony Curry in [place] during his time in the service they have since been married for fifty-nine years. They share three children together one of whom is deceased due to drug abuse. He later worked at [employer] where he stayed for forty five years. He grew up a strict Catholic but no longer practices. He shares that his relationship with God is his own.   In regard to illicit habits he use to drink and smoke but has since stopped.   Patient vocalizes being extremely weak in his home. He shares that he has felt no need to eat or drink which is contributing to the whole clinical picture. He states that he feels like he has no quality of life in the setting of being confined to the bed, having a colostomy, and a catheter. He  shares his frustrations about no longer being able to be the "man of the house". Offered therapeutic listening as a form of emotional support. Anthony Curry shares that she is scared with him at home as he had fallen yesterday.   A detailed discussion was had today regarding advanced directives, there are none currently filed.   We together reviewed that he is a DNAR/DNI and would not want life extending measures. He shares that at this point in time he would want to focus on the quality of time he has left on this earth. I brought up hospice and we reviewed their philosophy which is to ensure dignity and quality at the end of life. I shared with him that in the setting of his Stg IV lung cancer, failure to thrive, and profound debility it is an appropriate consideration at this juncture. He vocalized agreement with this as did his wife Anthony Curry.  The difference between a aggressive medical intervention path and a palliative comfort care path for this patient at this time was had.    We talked about transition to comfort measures in house and what that would entail inclusive of medications to control pain, dyspnea, agitation, nausea, itching, and hiccups.  We discussed stopping all uneccessary measures such as blood draws, needle sticks, and frequent vital signs.   The plan will be for [name] to transition home with hospice. He and his wife are in agreement.  SUMMARY OF RECOMMENDATIONS   DNAR/DNI  Comfort Care  TOC --> Transition home with home hospice  Code Status/Advance Care Planning: DNAR/DNI   Symptom Management:  Dyspnea:  - Morphine 2.74m PO/SL Q2H PRN Tylenol:  - Tylenol 6513mPO Q6H PRN Agitation: Anxiety:  - Lorazepam 0.5-44m88mO/SL/IV  Q4H PRN Nausea:  - Zofran 4mg59m  Q6H PRN  Dry Eyes:  - Artificial Tears PRN Xerostomia:  - Biotene 15ml67m  - BID oral care Urinary Retention:  - Maintain foley  Constipation:  - Bisacodyl 10mg 14mO PRN QDay   Palliative Prophylaxis:    Constipation, delirium, pain  Additional Recommendations (Limitations, Scope, Preferences):  Comfort Care   Psycho-social/Spiritual:   Desire for further Chaplaincy support: No  Additional Recommendations: Education on home hospice   Prognosis:   Poor, likely weeks   Discharge Planning: Discharge home with hospice care.  PPS: 20%    This conversation/these recommendations were discussed with patient primary care team, Dr. Ward/SRolin Barry In: 0820 Time Out: 0930 Total Time: 70 Gre108er than 50%  of this time was spent counseling and coordinating care related to the above assessment and plan.  [facility] Cell Phone: [phone] utilize secure chat with additional questions, if there is no response within 30 minutes please call the above phone number  Palliative Medicine Team providers are available by phone from 7am to 7pm daily and can be reached through the team cell phone.  Should this patient require assistance outside of these hours, please call the patient's attending physician.

## 2020-04-06 DIAGNOSIS — I82722 Chronic embolism and thrombosis of deep veins of left upper extremity: Secondary | ICD-10-CM | POA: Diagnosis not present

## 2020-04-06 DIAGNOSIS — M1990 Unspecified osteoarthritis, unspecified site: Secondary | ICD-10-CM | POA: Diagnosis not present

## 2020-04-06 DIAGNOSIS — Z8701 Personal history of pneumonia (recurrent): Secondary | ICD-10-CM | POA: Diagnosis not present

## 2020-04-06 DIAGNOSIS — E46 Unspecified protein-calorie malnutrition: Secondary | ICD-10-CM | POA: Diagnosis not present

## 2020-04-06 DIAGNOSIS — K219 Gastro-esophageal reflux disease without esophagitis: Secondary | ICD-10-CM | POA: Diagnosis not present

## 2020-04-06 DIAGNOSIS — Z960 Presence of urogenital implants: Secondary | ICD-10-CM | POA: Diagnosis not present

## 2020-04-06 DIAGNOSIS — R634 Abnormal weight loss: Secondary | ICD-10-CM | POA: Diagnosis not present

## 2020-04-06 DIAGNOSIS — Z7401 Bed confinement status: Secondary | ICD-10-CM | POA: Diagnosis not present

## 2020-04-06 DIAGNOSIS — Z681 Body mass index (BMI) 19 or less, adult: Secondary | ICD-10-CM | POA: Diagnosis not present

## 2020-04-06 DIAGNOSIS — Z8719 Personal history of other diseases of the digestive system: Secondary | ICD-10-CM | POA: Diagnosis not present

## 2020-04-06 DIAGNOSIS — R338 Other retention of urine: Secondary | ICD-10-CM | POA: Diagnosis not present

## 2020-04-06 DIAGNOSIS — F329 Major depressive disorder, single episode, unspecified: Secondary | ICD-10-CM | POA: Diagnosis not present

## 2020-04-06 DIAGNOSIS — N401 Enlarged prostate with lower urinary tract symptoms: Secondary | ICD-10-CM | POA: Diagnosis not present

## 2020-04-06 DIAGNOSIS — Z87891 Personal history of nicotine dependence: Secondary | ICD-10-CM | POA: Diagnosis not present

## 2020-04-06 DIAGNOSIS — S72002D Fracture of unspecified part of neck of left femur, subsequent encounter for closed fracture with routine healing: Secondary | ICD-10-CM | POA: Diagnosis not present

## 2020-04-06 DIAGNOSIS — Z87438 Personal history of other diseases of male genital organs: Secondary | ICD-10-CM | POA: Diagnosis not present

## 2020-04-06 DIAGNOSIS — D630 Anemia in neoplastic disease: Secondary | ICD-10-CM | POA: Diagnosis not present

## 2020-04-06 DIAGNOSIS — R627 Adult failure to thrive: Secondary | ICD-10-CM | POA: Diagnosis not present

## 2020-04-06 DIAGNOSIS — E871 Hypo-osmolality and hyponatremia: Secondary | ICD-10-CM | POA: Diagnosis not present

## 2020-04-06 DIAGNOSIS — Z9181 History of falling: Secondary | ICD-10-CM | POA: Diagnosis not present

## 2020-04-06 DIAGNOSIS — F419 Anxiety disorder, unspecified: Secondary | ICD-10-CM | POA: Diagnosis not present

## 2020-04-06 DIAGNOSIS — C3431 Malignant neoplasm of lower lobe, right bronchus or lung: Secondary | ICD-10-CM | POA: Diagnosis not present

## 2020-04-06 DIAGNOSIS — Z933 Colostomy status: Secondary | ICD-10-CM | POA: Diagnosis not present

## 2020-04-06 DIAGNOSIS — Z8781 Personal history of (healed) traumatic fracture: Secondary | ICD-10-CM | POA: Diagnosis not present

## 2020-04-06 DIAGNOSIS — I10 Essential (primary) hypertension: Secondary | ICD-10-CM | POA: Diagnosis not present

## 2020-04-07 DIAGNOSIS — C3490 Malignant neoplasm of unspecified part of unspecified bronchus or lung: Secondary | ICD-10-CM | POA: Diagnosis not present

## 2020-04-07 DIAGNOSIS — K5909 Other constipation: Secondary | ICD-10-CM | POA: Diagnosis not present

## 2020-04-07 DIAGNOSIS — N401 Enlarged prostate with lower urinary tract symptoms: Secondary | ICD-10-CM | POA: Diagnosis not present

## 2020-04-07 DIAGNOSIS — C3431 Malignant neoplasm of lower lobe, right bronchus or lung: Secondary | ICD-10-CM | POA: Diagnosis not present

## 2020-04-07 DIAGNOSIS — R338 Other retention of urine: Secondary | ICD-10-CM | POA: Diagnosis not present

## 2020-04-07 DIAGNOSIS — I10 Essential (primary) hypertension: Secondary | ICD-10-CM | POA: Diagnosis not present

## 2020-04-07 DIAGNOSIS — F341 Dysthymic disorder: Secondary | ICD-10-CM | POA: Diagnosis not present

## 2020-04-07 DIAGNOSIS — E43 Unspecified severe protein-calorie malnutrition: Secondary | ICD-10-CM | POA: Diagnosis not present

## 2020-04-07 DIAGNOSIS — F419 Anxiety disorder, unspecified: Secondary | ICD-10-CM | POA: Diagnosis not present

## 2020-04-07 DIAGNOSIS — Z960 Presence of urogenital implants: Secondary | ICD-10-CM | POA: Diagnosis not present

## 2020-04-08 DIAGNOSIS — R338 Other retention of urine: Secondary | ICD-10-CM | POA: Diagnosis not present

## 2020-04-08 DIAGNOSIS — I10 Essential (primary) hypertension: Secondary | ICD-10-CM | POA: Diagnosis not present

## 2020-04-08 DIAGNOSIS — C3431 Malignant neoplasm of lower lobe, right bronchus or lung: Secondary | ICD-10-CM | POA: Diagnosis not present

## 2020-04-08 DIAGNOSIS — F419 Anxiety disorder, unspecified: Secondary | ICD-10-CM | POA: Diagnosis not present

## 2020-04-08 DIAGNOSIS — Z960 Presence of urogenital implants: Secondary | ICD-10-CM | POA: Diagnosis not present

## 2020-04-08 DIAGNOSIS — N401 Enlarged prostate with lower urinary tract symptoms: Secondary | ICD-10-CM | POA: Diagnosis not present

## 2020-04-09 NOTE — Progress Notes (Signed)
Pharmacist Chemotherapy Monitoring - Follow Up Assessment    I verify that I have reviewed each item in the below checklist:  . Regimen for the patient is scheduled for the appropriate day and plan matches scheduled date. [name] Appropriate non-routine labs are ordered dependent on drug ordered. . If applicable, additional medications reviewed and ordered per protocol based on lifetime cumulative doses and/or treatment regimen.   Plan for follow-up and/or issues identified: No . I-vent associated with next due treatment: No . MD and/or nursing notified: No  Esperansa Sarabia K 04/09/2020 11:07 AM

## 2020-04-10 ENCOUNTER — Telehealth: Payer: Self-pay | Admitting: *Deleted

## 2020-04-10 NOTE — Telephone Encounter (Signed)
Received call from pt's wife, Anthony Curry. She is asking to cancel pt's appt next week. Anthony Curry is with hospice now and not doing well. Anthony Curry states that he tells her he is "ready to go". Anthony Curry tearful but accepting of the situation. Emotional support extended.

## 2020-04-13 DIAGNOSIS — C3431 Malignant neoplasm of lower lobe, right bronchus or lung: Secondary | ICD-10-CM | POA: Diagnosis not present

## 2020-04-13 DIAGNOSIS — R338 Other retention of urine: Secondary | ICD-10-CM | POA: Diagnosis not present

## 2020-04-13 DIAGNOSIS — Z960 Presence of urogenital implants: Secondary | ICD-10-CM | POA: Diagnosis not present

## 2020-04-13 DIAGNOSIS — I10 Essential (primary) hypertension: Secondary | ICD-10-CM | POA: Diagnosis not present

## 2020-04-13 DIAGNOSIS — N401 Enlarged prostate with lower urinary tract symptoms: Secondary | ICD-10-CM | POA: Diagnosis not present

## 2020-04-13 DIAGNOSIS — F419 Anxiety disorder, unspecified: Secondary | ICD-10-CM | POA: Diagnosis not present

## 2020-04-14 DIAGNOSIS — I10 Essential (primary) hypertension: Secondary | ICD-10-CM | POA: Diagnosis not present

## 2020-04-14 DIAGNOSIS — N401 Enlarged prostate with lower urinary tract symptoms: Secondary | ICD-10-CM | POA: Diagnosis not present

## 2020-04-14 DIAGNOSIS — R338 Other retention of urine: Secondary | ICD-10-CM | POA: Diagnosis not present

## 2020-04-14 DIAGNOSIS — C3431 Malignant neoplasm of lower lobe, right bronchus or lung: Secondary | ICD-10-CM | POA: Diagnosis not present

## 2020-04-14 DIAGNOSIS — Z960 Presence of urogenital implants: Secondary | ICD-10-CM | POA: Diagnosis not present

## 2020-04-14 DIAGNOSIS — F419 Anxiety disorder, unspecified: Secondary | ICD-10-CM | POA: Diagnosis not present

## 2020-04-15 ENCOUNTER — Other Ambulatory Visit: Payer: BLUE CROSS/BLUE SHIELD

## 2020-04-15 ENCOUNTER — Ambulatory Visit: Payer: BLUE CROSS/BLUE SHIELD

## 2020-04-15 ENCOUNTER — Ambulatory Visit: Payer: BLUE CROSS/BLUE SHIELD | Admitting: Internal Medicine

## 2020-04-15 DIAGNOSIS — C3431 Malignant neoplasm of lower lobe, right bronchus or lung: Secondary | ICD-10-CM | POA: Diagnosis not present

## 2020-04-15 DIAGNOSIS — R338 Other retention of urine: Secondary | ICD-10-CM | POA: Diagnosis not present

## 2020-04-15 DIAGNOSIS — N401 Enlarged prostate with lower urinary tract symptoms: Secondary | ICD-10-CM | POA: Diagnosis not present

## 2020-04-15 DIAGNOSIS — I10 Essential (primary) hypertension: Secondary | ICD-10-CM | POA: Diagnosis not present

## 2020-04-15 DIAGNOSIS — F419 Anxiety disorder, unspecified: Secondary | ICD-10-CM | POA: Diagnosis not present

## 2020-04-15 DIAGNOSIS — Z960 Presence of urogenital implants: Secondary | ICD-10-CM | POA: Diagnosis not present

## 2020-04-16 DIAGNOSIS — N401 Enlarged prostate with lower urinary tract symptoms: Secondary | ICD-10-CM | POA: Diagnosis not present

## 2020-04-16 DIAGNOSIS — I10 Essential (primary) hypertension: Secondary | ICD-10-CM | POA: Diagnosis not present

## 2020-04-16 DIAGNOSIS — C3431 Malignant neoplasm of lower lobe, right bronchus or lung: Secondary | ICD-10-CM | POA: Diagnosis not present

## 2020-04-16 DIAGNOSIS — Z960 Presence of urogenital implants: Secondary | ICD-10-CM | POA: Diagnosis not present

## 2020-04-16 DIAGNOSIS — R338 Other retention of urine: Secondary | ICD-10-CM | POA: Diagnosis not present

## 2020-04-16 DIAGNOSIS — F419 Anxiety disorder, unspecified: Secondary | ICD-10-CM | POA: Diagnosis not present

## 2020-04-17 DIAGNOSIS — I10 Essential (primary) hypertension: Secondary | ICD-10-CM | POA: Diagnosis not present

## 2020-04-17 DIAGNOSIS — F419 Anxiety disorder, unspecified: Secondary | ICD-10-CM | POA: Diagnosis not present

## 2020-04-17 DIAGNOSIS — R338 Other retention of urine: Secondary | ICD-10-CM | POA: Diagnosis not present

## 2020-04-17 DIAGNOSIS — C3431 Malignant neoplasm of lower lobe, right bronchus or lung: Secondary | ICD-10-CM | POA: Diagnosis not present

## 2020-04-17 DIAGNOSIS — Z960 Presence of urogenital implants: Secondary | ICD-10-CM | POA: Diagnosis not present

## 2020-04-17 DIAGNOSIS — N401 Enlarged prostate with lower urinary tract symptoms: Secondary | ICD-10-CM | POA: Diagnosis not present

## 2020-04-18 DIAGNOSIS — Z960 Presence of urogenital implants: Secondary | ICD-10-CM | POA: Diagnosis not present

## 2020-04-18 DIAGNOSIS — I10 Essential (primary) hypertension: Secondary | ICD-10-CM | POA: Diagnosis not present

## 2020-04-18 DIAGNOSIS — R338 Other retention of urine: Secondary | ICD-10-CM | POA: Diagnosis not present

## 2020-04-18 DIAGNOSIS — C3431 Malignant neoplasm of lower lobe, right bronchus or lung: Secondary | ICD-10-CM | POA: Diagnosis not present

## 2020-04-18 DIAGNOSIS — F419 Anxiety disorder, unspecified: Secondary | ICD-10-CM | POA: Diagnosis not present

## 2020-04-18 DIAGNOSIS — N401 Enlarged prostate with lower urinary tract symptoms: Secondary | ICD-10-CM | POA: Diagnosis not present

## 2020-04-19 DIAGNOSIS — C3431 Malignant neoplasm of lower lobe, right bronchus or lung: Secondary | ICD-10-CM | POA: Diagnosis not present

## 2020-04-19 DIAGNOSIS — F419 Anxiety disorder, unspecified: Secondary | ICD-10-CM | POA: Diagnosis not present

## 2020-04-19 DIAGNOSIS — N401 Enlarged prostate with lower urinary tract symptoms: Secondary | ICD-10-CM | POA: Diagnosis not present

## 2020-04-19 DIAGNOSIS — Z960 Presence of urogenital implants: Secondary | ICD-10-CM | POA: Diagnosis not present

## 2020-04-19 DIAGNOSIS — R338 Other retention of urine: Secondary | ICD-10-CM | POA: Diagnosis not present

## 2020-04-19 DIAGNOSIS — I10 Essential (primary) hypertension: Secondary | ICD-10-CM | POA: Diagnosis not present

## 2020-04-20 DIAGNOSIS — C3431 Malignant neoplasm of lower lobe, right bronchus or lung: Secondary | ICD-10-CM | POA: Diagnosis not present

## 2020-04-20 DIAGNOSIS — R338 Other retention of urine: Secondary | ICD-10-CM | POA: Diagnosis not present

## 2020-04-20 DIAGNOSIS — I10 Essential (primary) hypertension: Secondary | ICD-10-CM | POA: Diagnosis not present

## 2020-04-20 DIAGNOSIS — Z960 Presence of urogenital implants: Secondary | ICD-10-CM | POA: Diagnosis not present

## 2020-04-20 DIAGNOSIS — N401 Enlarged prostate with lower urinary tract symptoms: Secondary | ICD-10-CM | POA: Diagnosis not present

## 2020-04-20 DIAGNOSIS — F419 Anxiety disorder, unspecified: Secondary | ICD-10-CM | POA: Diagnosis not present

## 2020-04-22 DIAGNOSIS — R338 Other retention of urine: Secondary | ICD-10-CM | POA: Diagnosis not present

## 2020-04-22 DIAGNOSIS — I10 Essential (primary) hypertension: Secondary | ICD-10-CM | POA: Diagnosis not present

## 2020-04-22 DIAGNOSIS — F419 Anxiety disorder, unspecified: Secondary | ICD-10-CM | POA: Diagnosis not present

## 2020-04-22 DIAGNOSIS — Z960 Presence of urogenital implants: Secondary | ICD-10-CM | POA: Diagnosis not present

## 2020-04-22 DIAGNOSIS — C3431 Malignant neoplasm of lower lobe, right bronchus or lung: Secondary | ICD-10-CM | POA: Diagnosis not present

## 2020-04-22 DIAGNOSIS — N401 Enlarged prostate with lower urinary tract symptoms: Secondary | ICD-10-CM | POA: Diagnosis not present

## 2020-04-23 DIAGNOSIS — R338 Other retention of urine: Secondary | ICD-10-CM | POA: Diagnosis not present

## 2020-04-23 DIAGNOSIS — C3431 Malignant neoplasm of lower lobe, right bronchus or lung: Secondary | ICD-10-CM | POA: Diagnosis not present

## 2020-04-23 DIAGNOSIS — I10 Essential (primary) hypertension: Secondary | ICD-10-CM | POA: Diagnosis not present

## 2020-04-23 DIAGNOSIS — Z960 Presence of urogenital implants: Secondary | ICD-10-CM | POA: Diagnosis not present

## 2020-04-23 DIAGNOSIS — N401 Enlarged prostate with lower urinary tract symptoms: Secondary | ICD-10-CM | POA: Diagnosis not present

## 2020-04-23 DIAGNOSIS — F419 Anxiety disorder, unspecified: Secondary | ICD-10-CM | POA: Diagnosis not present

## 2020-04-24 DIAGNOSIS — I10 Essential (primary) hypertension: Secondary | ICD-10-CM | POA: Diagnosis not present

## 2020-04-24 DIAGNOSIS — Z960 Presence of urogenital implants: Secondary | ICD-10-CM | POA: Diagnosis not present

## 2020-04-24 DIAGNOSIS — N401 Enlarged prostate with lower urinary tract symptoms: Secondary | ICD-10-CM | POA: Diagnosis not present

## 2020-04-24 DIAGNOSIS — F419 Anxiety disorder, unspecified: Secondary | ICD-10-CM | POA: Diagnosis not present

## 2020-04-24 DIAGNOSIS — R338 Other retention of urine: Secondary | ICD-10-CM | POA: Diagnosis not present

## 2020-04-24 DIAGNOSIS — C3431 Malignant neoplasm of lower lobe, right bronchus or lung: Secondary | ICD-10-CM | POA: Diagnosis not present

## 2020-04-27 DIAGNOSIS — C3431 Malignant neoplasm of lower lobe, right bronchus or lung: Secondary | ICD-10-CM | POA: Diagnosis not present

## 2020-04-27 DIAGNOSIS — R338 Other retention of urine: Secondary | ICD-10-CM | POA: Diagnosis not present

## 2020-04-27 DIAGNOSIS — Z960 Presence of urogenital implants: Secondary | ICD-10-CM | POA: Diagnosis not present

## 2020-04-27 DIAGNOSIS — I10 Essential (primary) hypertension: Secondary | ICD-10-CM | POA: Diagnosis not present

## 2020-04-27 DIAGNOSIS — N401 Enlarged prostate with lower urinary tract symptoms: Secondary | ICD-10-CM | POA: Diagnosis not present

## 2020-04-27 DIAGNOSIS — F419 Anxiety disorder, unspecified: Secondary | ICD-10-CM | POA: Diagnosis not present

## 2020-04-28 DIAGNOSIS — F419 Anxiety disorder, unspecified: Secondary | ICD-10-CM | POA: Diagnosis not present

## 2020-04-28 DIAGNOSIS — C3431 Malignant neoplasm of lower lobe, right bronchus or lung: Secondary | ICD-10-CM | POA: Diagnosis not present

## 2020-04-28 DIAGNOSIS — I10 Essential (primary) hypertension: Secondary | ICD-10-CM | POA: Diagnosis not present

## 2020-04-28 DIAGNOSIS — Z960 Presence of urogenital implants: Secondary | ICD-10-CM | POA: Diagnosis not present

## 2020-04-28 DIAGNOSIS — R338 Other retention of urine: Secondary | ICD-10-CM | POA: Diagnosis not present

## 2020-04-28 DIAGNOSIS — N401 Enlarged prostate with lower urinary tract symptoms: Secondary | ICD-10-CM | POA: Diagnosis not present

## 2020-04-29 DIAGNOSIS — N401 Enlarged prostate with lower urinary tract symptoms: Secondary | ICD-10-CM | POA: Diagnosis not present

## 2020-04-29 DIAGNOSIS — C3431 Malignant neoplasm of lower lobe, right bronchus or lung: Secondary | ICD-10-CM | POA: Diagnosis not present

## 2020-04-29 DIAGNOSIS — Z960 Presence of urogenital implants: Secondary | ICD-10-CM | POA: Diagnosis not present

## 2020-04-29 DIAGNOSIS — I10 Essential (primary) hypertension: Secondary | ICD-10-CM | POA: Diagnosis not present

## 2020-04-29 DIAGNOSIS — R338 Other retention of urine: Secondary | ICD-10-CM | POA: Diagnosis not present

## 2020-04-29 DIAGNOSIS — F419 Anxiety disorder, unspecified: Secondary | ICD-10-CM | POA: Diagnosis not present

## 2020-04-30 DIAGNOSIS — C3431 Malignant neoplasm of lower lobe, right bronchus or lung: Secondary | ICD-10-CM | POA: Diagnosis not present

## 2020-04-30 DIAGNOSIS — I10 Essential (primary) hypertension: Secondary | ICD-10-CM | POA: Diagnosis not present

## 2020-04-30 DIAGNOSIS — F419 Anxiety disorder, unspecified: Secondary | ICD-10-CM | POA: Diagnosis not present

## 2020-04-30 DIAGNOSIS — N401 Enlarged prostate with lower urinary tract symptoms: Secondary | ICD-10-CM | POA: Diagnosis not present

## 2020-04-30 DIAGNOSIS — R338 Other retention of urine: Secondary | ICD-10-CM | POA: Diagnosis not present

## 2020-04-30 DIAGNOSIS — Z960 Presence of urogenital implants: Secondary | ICD-10-CM | POA: Diagnosis not present

## 2020-05-01 DIAGNOSIS — F419 Anxiety disorder, unspecified: Secondary | ICD-10-CM | POA: Diagnosis not present

## 2020-05-01 DIAGNOSIS — N401 Enlarged prostate with lower urinary tract symptoms: Secondary | ICD-10-CM | POA: Diagnosis not present

## 2020-05-01 DIAGNOSIS — C3431 Malignant neoplasm of lower lobe, right bronchus or lung: Secondary | ICD-10-CM | POA: Diagnosis not present

## 2020-05-01 DIAGNOSIS — I10 Essential (primary) hypertension: Secondary | ICD-10-CM | POA: Diagnosis not present

## 2020-05-01 DIAGNOSIS — Z960 Presence of urogenital implants: Secondary | ICD-10-CM | POA: Diagnosis not present

## 2020-05-01 DIAGNOSIS — R338 Other retention of urine: Secondary | ICD-10-CM | POA: Diagnosis not present

## 2020-05-04 DIAGNOSIS — C3490 Malignant neoplasm of unspecified part of unspecified bronchus or lung: Secondary | ICD-10-CM | POA: Diagnosis not present

## 2020-05-04 DIAGNOSIS — F341 Dysthymic disorder: Secondary | ICD-10-CM | POA: Diagnosis not present

## 2020-05-06 DIAGNOSIS — K5792 Diverticulitis of intestine, part unspecified, without perforation or abscess without bleeding: Secondary | ICD-10-CM | POA: Diagnosis not present

## 2020-05-06 DIAGNOSIS — Z433 Encounter for attention to colostomy: Secondary | ICD-10-CM | POA: Diagnosis not present

## 2020-05-22 ENCOUNTER — Other Ambulatory Visit: Payer: Self-pay | Admitting: Medical Oncology

## 2020-05-22 DIAGNOSIS — R530 Neoplastic (malignant) related fatigue: Secondary | ICD-10-CM

## 2020-05-22 MED ORDER — LEVOTHYROXINE SODIUM 50 MCG PO TABS: 50.0000 ug | ORAL_TABLET | Freq: Every day | ORAL | 0 refills | Status: DC

## 2020-05-22 NOTE — Progress Notes (Signed)
Faxed synthroid to preferred pharmacy.

## 2020-06-03 ENCOUNTER — Telehealth: Payer: Self-pay | Admitting: Medical Oncology

## 2020-06-03 NOTE — Telephone Encounter (Signed)
Requests CT scans-Anthony Curry wants to get  a CT scan to evaluate his disease and see Dr [name] before he moves to [place] in Sept. He  revoking hospice at this time .

## 2020-06-04 DIAGNOSIS — Z7401 Bed confinement status: Secondary | ICD-10-CM | POA: Diagnosis not present

## 2020-06-04 DIAGNOSIS — R627 Adult failure to thrive: Secondary | ICD-10-CM | POA: Diagnosis not present

## 2020-06-04 DIAGNOSIS — Z8701 Personal history of pneumonia (recurrent): Secondary | ICD-10-CM | POA: Diagnosis not present

## 2020-06-04 DIAGNOSIS — I82722 Chronic embolism and thrombosis of deep veins of left upper extremity: Secondary | ICD-10-CM | POA: Diagnosis not present

## 2020-06-04 DIAGNOSIS — N401 Enlarged prostate with lower urinary tract symptoms: Secondary | ICD-10-CM | POA: Diagnosis not present

## 2020-06-04 DIAGNOSIS — Z8719 Personal history of other diseases of the digestive system: Secondary | ICD-10-CM | POA: Diagnosis not present

## 2020-06-04 DIAGNOSIS — Z681 Body mass index (BMI) 19 or less, adult: Secondary | ICD-10-CM | POA: Diagnosis not present

## 2020-06-04 DIAGNOSIS — Z933 Colostomy status: Secondary | ICD-10-CM | POA: Diagnosis not present

## 2020-06-04 DIAGNOSIS — R338 Other retention of urine: Secondary | ICD-10-CM | POA: Diagnosis not present

## 2020-06-04 DIAGNOSIS — Z960 Presence of urogenital implants: Secondary | ICD-10-CM | POA: Diagnosis not present

## 2020-06-04 DIAGNOSIS — I10 Essential (primary) hypertension: Secondary | ICD-10-CM | POA: Diagnosis not present

## 2020-06-04 DIAGNOSIS — Z87891 Personal history of nicotine dependence: Secondary | ICD-10-CM | POA: Diagnosis not present

## 2020-06-04 DIAGNOSIS — R634 Abnormal weight loss: Secondary | ICD-10-CM | POA: Diagnosis not present

## 2020-06-04 DIAGNOSIS — C3431 Malignant neoplasm of lower lobe, right bronchus or lung: Secondary | ICD-10-CM | POA: Diagnosis not present

## 2020-06-04 DIAGNOSIS — F329 Major depressive disorder, single episode, unspecified: Secondary | ICD-10-CM | POA: Diagnosis not present

## 2020-06-04 DIAGNOSIS — E871 Hypo-osmolality and hyponatremia: Secondary | ICD-10-CM | POA: Diagnosis not present

## 2020-06-04 DIAGNOSIS — Z9181 History of falling: Secondary | ICD-10-CM | POA: Diagnosis not present

## 2020-06-04 DIAGNOSIS — D630 Anemia in neoplastic disease: Secondary | ICD-10-CM | POA: Diagnosis not present

## 2020-06-04 DIAGNOSIS — K219 Gastro-esophageal reflux disease without esophagitis: Secondary | ICD-10-CM | POA: Diagnosis not present

## 2020-06-04 DIAGNOSIS — F419 Anxiety disorder, unspecified: Secondary | ICD-10-CM | POA: Diagnosis not present

## 2020-06-04 DIAGNOSIS — E46 Unspecified protein-calorie malnutrition: Secondary | ICD-10-CM | POA: Diagnosis not present

## 2020-06-04 DIAGNOSIS — Z87438 Personal history of other diseases of male genital organs: Secondary | ICD-10-CM | POA: Diagnosis not present

## 2020-06-04 DIAGNOSIS — M1990 Unspecified osteoarthritis, unspecified site: Secondary | ICD-10-CM | POA: Diagnosis not present

## 2020-06-04 DIAGNOSIS — S72002D Fracture of unspecified part of neck of left femur, subsequent encounter for closed fracture with routine healing: Secondary | ICD-10-CM | POA: Diagnosis not present

## 2020-06-04 DIAGNOSIS — Z8781 Personal history of (healed) traumatic fracture: Secondary | ICD-10-CM | POA: Diagnosis not present

## 2020-06-05 DIAGNOSIS — R338 Other retention of urine: Secondary | ICD-10-CM | POA: Diagnosis not present

## 2020-06-05 DIAGNOSIS — I10 Essential (primary) hypertension: Secondary | ICD-10-CM | POA: Diagnosis not present

## 2020-06-05 DIAGNOSIS — C3431 Malignant neoplasm of lower lobe, right bronchus or lung: Secondary | ICD-10-CM | POA: Diagnosis not present

## 2020-06-05 DIAGNOSIS — F419 Anxiety disorder, unspecified: Secondary | ICD-10-CM | POA: Diagnosis not present

## 2020-06-05 DIAGNOSIS — Z960 Presence of urogenital implants: Secondary | ICD-10-CM | POA: Diagnosis not present

## 2020-06-05 DIAGNOSIS — N401 Enlarged prostate with lower urinary tract symptoms: Secondary | ICD-10-CM | POA: Diagnosis not present

## 2020-06-06 DIAGNOSIS — C3431 Malignant neoplasm of lower lobe, right bronchus or lung: Secondary | ICD-10-CM | POA: Diagnosis not present

## 2020-06-06 DIAGNOSIS — K5909 Other constipation: Secondary | ICD-10-CM | POA: Diagnosis not present

## 2020-06-06 DIAGNOSIS — F419 Anxiety disorder, unspecified: Secondary | ICD-10-CM | POA: Diagnosis not present

## 2020-06-06 DIAGNOSIS — R338 Other retention of urine: Secondary | ICD-10-CM | POA: Diagnosis not present

## 2020-06-06 DIAGNOSIS — C3490 Malignant neoplasm of unspecified part of unspecified bronchus or lung: Secondary | ICD-10-CM | POA: Diagnosis not present

## 2020-06-06 DIAGNOSIS — I10 Essential (primary) hypertension: Secondary | ICD-10-CM | POA: Diagnosis not present

## 2020-06-06 DIAGNOSIS — G894 Chronic pain syndrome: Secondary | ICD-10-CM | POA: Diagnosis not present

## 2020-06-06 DIAGNOSIS — N401 Enlarged prostate with lower urinary tract symptoms: Secondary | ICD-10-CM | POA: Diagnosis not present

## 2020-06-06 DIAGNOSIS — Z960 Presence of urogenital implants: Secondary | ICD-10-CM | POA: Diagnosis not present

## 2020-06-08 DIAGNOSIS — R338 Other retention of urine: Secondary | ICD-10-CM | POA: Diagnosis not present

## 2020-06-08 DIAGNOSIS — F419 Anxiety disorder, unspecified: Secondary | ICD-10-CM | POA: Diagnosis not present

## 2020-06-08 DIAGNOSIS — Z960 Presence of urogenital implants: Secondary | ICD-10-CM | POA: Diagnosis not present

## 2020-06-08 DIAGNOSIS — I10 Essential (primary) hypertension: Secondary | ICD-10-CM | POA: Diagnosis not present

## 2020-06-08 DIAGNOSIS — C3431 Malignant neoplasm of lower lobe, right bronchus or lung: Secondary | ICD-10-CM | POA: Diagnosis not present

## 2020-06-08 DIAGNOSIS — N401 Enlarged prostate with lower urinary tract symptoms: Secondary | ICD-10-CM | POA: Diagnosis not present

## 2020-06-09 DIAGNOSIS — Z960 Presence of urogenital implants: Secondary | ICD-10-CM | POA: Diagnosis not present

## 2020-06-09 DIAGNOSIS — F419 Anxiety disorder, unspecified: Secondary | ICD-10-CM | POA: Diagnosis not present

## 2020-06-09 DIAGNOSIS — C3431 Malignant neoplasm of lower lobe, right bronchus or lung: Secondary | ICD-10-CM | POA: Diagnosis not present

## 2020-06-09 DIAGNOSIS — I10 Essential (primary) hypertension: Secondary | ICD-10-CM | POA: Diagnosis not present

## 2020-06-09 DIAGNOSIS — R338 Other retention of urine: Secondary | ICD-10-CM | POA: Diagnosis not present

## 2020-06-09 DIAGNOSIS — N401 Enlarged prostate with lower urinary tract symptoms: Secondary | ICD-10-CM | POA: Diagnosis not present

## 2020-06-10 DIAGNOSIS — N401 Enlarged prostate with lower urinary tract symptoms: Secondary | ICD-10-CM | POA: Diagnosis not present

## 2020-06-10 DIAGNOSIS — I10 Essential (primary) hypertension: Secondary | ICD-10-CM | POA: Diagnosis not present

## 2020-06-10 DIAGNOSIS — F419 Anxiety disorder, unspecified: Secondary | ICD-10-CM | POA: Diagnosis not present

## 2020-06-10 DIAGNOSIS — Z960 Presence of urogenital implants: Secondary | ICD-10-CM | POA: Diagnosis not present

## 2020-06-10 DIAGNOSIS — R338 Other retention of urine: Secondary | ICD-10-CM | POA: Diagnosis not present

## 2020-06-10 DIAGNOSIS — C3431 Malignant neoplasm of lower lobe, right bronchus or lung: Secondary | ICD-10-CM | POA: Diagnosis not present

## 2020-06-12 DIAGNOSIS — R338 Other retention of urine: Secondary | ICD-10-CM | POA: Diagnosis not present

## 2020-06-12 DIAGNOSIS — I10 Essential (primary) hypertension: Secondary | ICD-10-CM | POA: Diagnosis not present

## 2020-06-12 DIAGNOSIS — F419 Anxiety disorder, unspecified: Secondary | ICD-10-CM | POA: Diagnosis not present

## 2020-06-12 DIAGNOSIS — Z960 Presence of urogenital implants: Secondary | ICD-10-CM | POA: Diagnosis not present

## 2020-06-12 DIAGNOSIS — N401 Enlarged prostate with lower urinary tract symptoms: Secondary | ICD-10-CM | POA: Diagnosis not present

## 2020-06-12 DIAGNOSIS — C3431 Malignant neoplasm of lower lobe, right bronchus or lung: Secondary | ICD-10-CM | POA: Diagnosis not present

## 2020-06-15 DIAGNOSIS — Z960 Presence of urogenital implants: Secondary | ICD-10-CM | POA: Diagnosis not present

## 2020-06-15 DIAGNOSIS — N401 Enlarged prostate with lower urinary tract symptoms: Secondary | ICD-10-CM | POA: Diagnosis not present

## 2020-06-15 DIAGNOSIS — I10 Essential (primary) hypertension: Secondary | ICD-10-CM | POA: Diagnosis not present

## 2020-06-15 DIAGNOSIS — C3431 Malignant neoplasm of lower lobe, right bronchus or lung: Secondary | ICD-10-CM | POA: Diagnosis not present

## 2020-06-15 DIAGNOSIS — R338 Other retention of urine: Secondary | ICD-10-CM | POA: Diagnosis not present

## 2020-06-15 DIAGNOSIS — F419 Anxiety disorder, unspecified: Secondary | ICD-10-CM | POA: Diagnosis not present

## 2020-06-16 DIAGNOSIS — Z960 Presence of urogenital implants: Secondary | ICD-10-CM | POA: Diagnosis not present

## 2020-06-16 DIAGNOSIS — F419 Anxiety disorder, unspecified: Secondary | ICD-10-CM | POA: Diagnosis not present

## 2020-06-16 DIAGNOSIS — R338 Other retention of urine: Secondary | ICD-10-CM | POA: Diagnosis not present

## 2020-06-16 DIAGNOSIS — C3431 Malignant neoplasm of lower lobe, right bronchus or lung: Secondary | ICD-10-CM | POA: Diagnosis not present

## 2020-06-16 DIAGNOSIS — N401 Enlarged prostate with lower urinary tract symptoms: Secondary | ICD-10-CM | POA: Diagnosis not present

## 2020-06-16 DIAGNOSIS — I10 Essential (primary) hypertension: Secondary | ICD-10-CM | POA: Diagnosis not present

## 2020-06-17 DIAGNOSIS — C3431 Malignant neoplasm of lower lobe, right bronchus or lung: Secondary | ICD-10-CM | POA: Diagnosis not present

## 2020-06-17 DIAGNOSIS — R338 Other retention of urine: Secondary | ICD-10-CM | POA: Diagnosis not present

## 2020-06-17 DIAGNOSIS — F419 Anxiety disorder, unspecified: Secondary | ICD-10-CM | POA: Diagnosis not present

## 2020-06-17 DIAGNOSIS — I10 Essential (primary) hypertension: Secondary | ICD-10-CM | POA: Diagnosis not present

## 2020-06-17 DIAGNOSIS — Z960 Presence of urogenital implants: Secondary | ICD-10-CM | POA: Diagnosis not present

## 2020-06-17 DIAGNOSIS — N401 Enlarged prostate with lower urinary tract symptoms: Secondary | ICD-10-CM | POA: Diagnosis not present

## 2020-06-19 DIAGNOSIS — F419 Anxiety disorder, unspecified: Secondary | ICD-10-CM | POA: Diagnosis not present

## 2020-06-19 DIAGNOSIS — Z960 Presence of urogenital implants: Secondary | ICD-10-CM | POA: Diagnosis not present

## 2020-06-19 DIAGNOSIS — R338 Other retention of urine: Secondary | ICD-10-CM | POA: Diagnosis not present

## 2020-06-19 DIAGNOSIS — C3431 Malignant neoplasm of lower lobe, right bronchus or lung: Secondary | ICD-10-CM | POA: Diagnosis not present

## 2020-06-19 DIAGNOSIS — I10 Essential (primary) hypertension: Secondary | ICD-10-CM | POA: Diagnosis not present

## 2020-06-19 DIAGNOSIS — N401 Enlarged prostate with lower urinary tract symptoms: Secondary | ICD-10-CM | POA: Diagnosis not present

## 2020-06-22 DIAGNOSIS — C3431 Malignant neoplasm of lower lobe, right bronchus or lung: Secondary | ICD-10-CM | POA: Diagnosis not present

## 2020-06-22 DIAGNOSIS — I10 Essential (primary) hypertension: Secondary | ICD-10-CM | POA: Diagnosis not present

## 2020-06-22 DIAGNOSIS — Z960 Presence of urogenital implants: Secondary | ICD-10-CM | POA: Diagnosis not present

## 2020-06-22 DIAGNOSIS — R338 Other retention of urine: Secondary | ICD-10-CM | POA: Diagnosis not present

## 2020-06-22 DIAGNOSIS — N401 Enlarged prostate with lower urinary tract symptoms: Secondary | ICD-10-CM | POA: Diagnosis not present

## 2020-06-22 DIAGNOSIS — F419 Anxiety disorder, unspecified: Secondary | ICD-10-CM | POA: Diagnosis not present

## 2020-06-24 DIAGNOSIS — I10 Essential (primary) hypertension: Secondary | ICD-10-CM | POA: Diagnosis not present

## 2020-06-24 DIAGNOSIS — N401 Enlarged prostate with lower urinary tract symptoms: Secondary | ICD-10-CM | POA: Diagnosis not present

## 2020-06-24 DIAGNOSIS — C3431 Malignant neoplasm of lower lobe, right bronchus or lung: Secondary | ICD-10-CM | POA: Diagnosis not present

## 2020-06-24 DIAGNOSIS — F419 Anxiety disorder, unspecified: Secondary | ICD-10-CM | POA: Diagnosis not present

## 2020-06-24 DIAGNOSIS — R338 Other retention of urine: Secondary | ICD-10-CM | POA: Diagnosis not present

## 2020-06-24 DIAGNOSIS — Z960 Presence of urogenital implants: Secondary | ICD-10-CM | POA: Diagnosis not present

## 2020-06-26 DIAGNOSIS — I10 Essential (primary) hypertension: Secondary | ICD-10-CM | POA: Diagnosis not present

## 2020-06-26 DIAGNOSIS — Z960 Presence of urogenital implants: Secondary | ICD-10-CM | POA: Diagnosis not present

## 2020-06-26 DIAGNOSIS — N401 Enlarged prostate with lower urinary tract symptoms: Secondary | ICD-10-CM | POA: Diagnosis not present

## 2020-06-26 DIAGNOSIS — F419 Anxiety disorder, unspecified: Secondary | ICD-10-CM | POA: Diagnosis not present

## 2020-06-26 DIAGNOSIS — C3431 Malignant neoplasm of lower lobe, right bronchus or lung: Secondary | ICD-10-CM | POA: Diagnosis not present

## 2020-06-26 DIAGNOSIS — R338 Other retention of urine: Secondary | ICD-10-CM | POA: Diagnosis not present

## 2020-06-29 DIAGNOSIS — C3431 Malignant neoplasm of lower lobe, right bronchus or lung: Secondary | ICD-10-CM | POA: Diagnosis not present

## 2020-06-29 DIAGNOSIS — F419 Anxiety disorder, unspecified: Secondary | ICD-10-CM | POA: Diagnosis not present

## 2020-06-29 DIAGNOSIS — N401 Enlarged prostate with lower urinary tract symptoms: Secondary | ICD-10-CM | POA: Diagnosis not present

## 2020-06-29 DIAGNOSIS — I10 Essential (primary) hypertension: Secondary | ICD-10-CM | POA: Diagnosis not present

## 2020-06-29 DIAGNOSIS — Z960 Presence of urogenital implants: Secondary | ICD-10-CM | POA: Diagnosis not present

## 2020-06-29 DIAGNOSIS — R338 Other retention of urine: Secondary | ICD-10-CM | POA: Diagnosis not present

## 2020-06-30 DIAGNOSIS — F419 Anxiety disorder, unspecified: Secondary | ICD-10-CM | POA: Diagnosis not present

## 2020-06-30 DIAGNOSIS — I10 Essential (primary) hypertension: Secondary | ICD-10-CM | POA: Diagnosis not present

## 2020-06-30 DIAGNOSIS — R338 Other retention of urine: Secondary | ICD-10-CM | POA: Diagnosis not present

## 2020-06-30 DIAGNOSIS — C3431 Malignant neoplasm of lower lobe, right bronchus or lung: Secondary | ICD-10-CM | POA: Diagnosis not present

## 2020-06-30 DIAGNOSIS — Z960 Presence of urogenital implants: Secondary | ICD-10-CM | POA: Diagnosis not present

## 2020-06-30 DIAGNOSIS — N401 Enlarged prostate with lower urinary tract symptoms: Secondary | ICD-10-CM | POA: Diagnosis not present

## 2020-07-01 DIAGNOSIS — F419 Anxiety disorder, unspecified: Secondary | ICD-10-CM | POA: Diagnosis not present

## 2020-07-01 DIAGNOSIS — Z960 Presence of urogenital implants: Secondary | ICD-10-CM | POA: Diagnosis not present

## 2020-07-01 DIAGNOSIS — C3431 Malignant neoplasm of lower lobe, right bronchus or lung: Secondary | ICD-10-CM | POA: Diagnosis not present

## 2020-07-01 DIAGNOSIS — I10 Essential (primary) hypertension: Secondary | ICD-10-CM | POA: Diagnosis not present

## 2020-07-01 DIAGNOSIS — R338 Other retention of urine: Secondary | ICD-10-CM | POA: Diagnosis not present

## 2020-07-01 DIAGNOSIS — N401 Enlarged prostate with lower urinary tract symptoms: Secondary | ICD-10-CM | POA: Diagnosis not present

## 2020-07-03 DIAGNOSIS — N401 Enlarged prostate with lower urinary tract symptoms: Secondary | ICD-10-CM | POA: Diagnosis not present

## 2020-07-03 DIAGNOSIS — Z960 Presence of urogenital implants: Secondary | ICD-10-CM | POA: Diagnosis not present

## 2020-07-03 DIAGNOSIS — C3431 Malignant neoplasm of lower lobe, right bronchus or lung: Secondary | ICD-10-CM | POA: Diagnosis not present

## 2020-07-03 DIAGNOSIS — R338 Other retention of urine: Secondary | ICD-10-CM | POA: Diagnosis not present

## 2020-07-03 DIAGNOSIS — F419 Anxiety disorder, unspecified: Secondary | ICD-10-CM | POA: Diagnosis not present

## 2020-07-03 DIAGNOSIS — I10 Essential (primary) hypertension: Secondary | ICD-10-CM | POA: Diagnosis not present

## 2020-07-05 DIAGNOSIS — R634 Abnormal weight loss: Secondary | ICD-10-CM | POA: Diagnosis not present

## 2020-07-05 DIAGNOSIS — Z681 Body mass index (BMI) 19 or less, adult: Secondary | ICD-10-CM | POA: Diagnosis not present

## 2020-07-05 DIAGNOSIS — R338 Other retention of urine: Secondary | ICD-10-CM | POA: Diagnosis not present

## 2020-07-05 DIAGNOSIS — E871 Hypo-osmolality and hyponatremia: Secondary | ICD-10-CM | POA: Diagnosis not present

## 2020-07-05 DIAGNOSIS — Z87891 Personal history of nicotine dependence: Secondary | ICD-10-CM | POA: Diagnosis not present

## 2020-07-05 DIAGNOSIS — R627 Adult failure to thrive: Secondary | ICD-10-CM | POA: Diagnosis not present

## 2020-07-05 DIAGNOSIS — S72002D Fracture of unspecified part of neck of left femur, subsequent encounter for closed fracture with routine healing: Secondary | ICD-10-CM | POA: Diagnosis not present

## 2020-07-05 DIAGNOSIS — N401 Enlarged prostate with lower urinary tract symptoms: Secondary | ICD-10-CM | POA: Diagnosis not present

## 2020-07-05 DIAGNOSIS — I10 Essential (primary) hypertension: Secondary | ICD-10-CM | POA: Diagnosis not present

## 2020-07-05 DIAGNOSIS — Z960 Presence of urogenital implants: Secondary | ICD-10-CM | POA: Diagnosis not present

## 2020-07-05 DIAGNOSIS — Z87438 Personal history of other diseases of male genital organs: Secondary | ICD-10-CM | POA: Diagnosis not present

## 2020-07-05 DIAGNOSIS — M1990 Unspecified osteoarthritis, unspecified site: Secondary | ICD-10-CM | POA: Diagnosis not present

## 2020-07-05 DIAGNOSIS — Z9181 History of falling: Secondary | ICD-10-CM | POA: Diagnosis not present

## 2020-07-05 DIAGNOSIS — F329 Major depressive disorder, single episode, unspecified: Secondary | ICD-10-CM | POA: Diagnosis not present

## 2020-07-05 DIAGNOSIS — F419 Anxiety disorder, unspecified: Secondary | ICD-10-CM | POA: Diagnosis not present

## 2020-07-05 DIAGNOSIS — E46 Unspecified protein-calorie malnutrition: Secondary | ICD-10-CM | POA: Diagnosis not present

## 2020-07-05 DIAGNOSIS — Z8781 Personal history of (healed) traumatic fracture: Secondary | ICD-10-CM | POA: Diagnosis not present

## 2020-07-05 DIAGNOSIS — K219 Gastro-esophageal reflux disease without esophagitis: Secondary | ICD-10-CM | POA: Diagnosis not present

## 2020-07-05 DIAGNOSIS — Z8719 Personal history of other diseases of the digestive system: Secondary | ICD-10-CM | POA: Diagnosis not present

## 2020-07-05 DIAGNOSIS — I82722 Chronic embolism and thrombosis of deep veins of left upper extremity: Secondary | ICD-10-CM | POA: Diagnosis not present

## 2020-07-05 DIAGNOSIS — Z8701 Personal history of pneumonia (recurrent): Secondary | ICD-10-CM | POA: Diagnosis not present

## 2020-07-05 DIAGNOSIS — C3431 Malignant neoplasm of lower lobe, right bronchus or lung: Secondary | ICD-10-CM | POA: Diagnosis not present

## 2020-07-05 DIAGNOSIS — Z7401 Bed confinement status: Secondary | ICD-10-CM | POA: Diagnosis not present

## 2020-07-05 DIAGNOSIS — D630 Anemia in neoplastic disease: Secondary | ICD-10-CM | POA: Diagnosis not present

## 2020-07-05 DIAGNOSIS — Z933 Colostomy status: Secondary | ICD-10-CM | POA: Diagnosis not present

## 2020-07-06 DIAGNOSIS — F419 Anxiety disorder, unspecified: Secondary | ICD-10-CM | POA: Diagnosis not present

## 2020-07-06 DIAGNOSIS — N401 Enlarged prostate with lower urinary tract symptoms: Secondary | ICD-10-CM | POA: Diagnosis not present

## 2020-07-06 DIAGNOSIS — I10 Essential (primary) hypertension: Secondary | ICD-10-CM | POA: Diagnosis not present

## 2020-07-06 DIAGNOSIS — Z960 Presence of urogenital implants: Secondary | ICD-10-CM | POA: Diagnosis not present

## 2020-07-06 DIAGNOSIS — C3431 Malignant neoplasm of lower lobe, right bronchus or lung: Secondary | ICD-10-CM | POA: Diagnosis not present

## 2020-07-06 DIAGNOSIS — R338 Other retention of urine: Secondary | ICD-10-CM | POA: Diagnosis not present

## 2020-07-08 DIAGNOSIS — Z960 Presence of urogenital implants: Secondary | ICD-10-CM | POA: Diagnosis not present

## 2020-07-08 DIAGNOSIS — N401 Enlarged prostate with lower urinary tract symptoms: Secondary | ICD-10-CM | POA: Diagnosis not present

## 2020-07-08 DIAGNOSIS — F419 Anxiety disorder, unspecified: Secondary | ICD-10-CM | POA: Diagnosis not present

## 2020-07-08 DIAGNOSIS — C3431 Malignant neoplasm of lower lobe, right bronchus or lung: Secondary | ICD-10-CM | POA: Diagnosis not present

## 2020-07-08 DIAGNOSIS — R338 Other retention of urine: Secondary | ICD-10-CM | POA: Diagnosis not present

## 2020-07-08 DIAGNOSIS — I10 Essential (primary) hypertension: Secondary | ICD-10-CM | POA: Diagnosis not present

## 2020-07-09 DIAGNOSIS — I10 Essential (primary) hypertension: Secondary | ICD-10-CM | POA: Diagnosis not present

## 2020-07-09 DIAGNOSIS — N401 Enlarged prostate with lower urinary tract symptoms: Secondary | ICD-10-CM | POA: Diagnosis not present

## 2020-07-09 DIAGNOSIS — C3431 Malignant neoplasm of lower lobe, right bronchus or lung: Secondary | ICD-10-CM | POA: Diagnosis not present

## 2020-07-09 DIAGNOSIS — F419 Anxiety disorder, unspecified: Secondary | ICD-10-CM | POA: Diagnosis not present

## 2020-07-09 DIAGNOSIS — Z960 Presence of urogenital implants: Secondary | ICD-10-CM | POA: Diagnosis not present

## 2020-07-09 DIAGNOSIS — R338 Other retention of urine: Secondary | ICD-10-CM | POA: Diagnosis not present

## 2020-07-10 DIAGNOSIS — I10 Essential (primary) hypertension: Secondary | ICD-10-CM | POA: Diagnosis not present

## 2020-07-10 DIAGNOSIS — N401 Enlarged prostate with lower urinary tract symptoms: Secondary | ICD-10-CM | POA: Diagnosis not present

## 2020-07-10 DIAGNOSIS — Z960 Presence of urogenital implants: Secondary | ICD-10-CM | POA: Diagnosis not present

## 2020-07-10 DIAGNOSIS — C3431 Malignant neoplasm of lower lobe, right bronchus or lung: Secondary | ICD-10-CM | POA: Diagnosis not present

## 2020-07-10 DIAGNOSIS — F419 Anxiety disorder, unspecified: Secondary | ICD-10-CM | POA: Diagnosis not present

## 2020-07-10 DIAGNOSIS — R338 Other retention of urine: Secondary | ICD-10-CM | POA: Diagnosis not present

## 2020-07-13 DIAGNOSIS — I10 Essential (primary) hypertension: Secondary | ICD-10-CM | POA: Diagnosis not present

## 2020-07-13 DIAGNOSIS — R338 Other retention of urine: Secondary | ICD-10-CM | POA: Diagnosis not present

## 2020-07-13 DIAGNOSIS — N401 Enlarged prostate with lower urinary tract symptoms: Secondary | ICD-10-CM | POA: Diagnosis not present

## 2020-07-13 DIAGNOSIS — Z960 Presence of urogenital implants: Secondary | ICD-10-CM | POA: Diagnosis not present

## 2020-07-13 DIAGNOSIS — C3431 Malignant neoplasm of lower lobe, right bronchus or lung: Secondary | ICD-10-CM | POA: Diagnosis not present

## 2020-07-13 DIAGNOSIS — F419 Anxiety disorder, unspecified: Secondary | ICD-10-CM | POA: Diagnosis not present

## 2020-07-15 DIAGNOSIS — R338 Other retention of urine: Secondary | ICD-10-CM | POA: Diagnosis not present

## 2020-07-15 DIAGNOSIS — F419 Anxiety disorder, unspecified: Secondary | ICD-10-CM | POA: Diagnosis not present

## 2020-07-15 DIAGNOSIS — C3431 Malignant neoplasm of lower lobe, right bronchus or lung: Secondary | ICD-10-CM | POA: Diagnosis not present

## 2020-07-15 DIAGNOSIS — N401 Enlarged prostate with lower urinary tract symptoms: Secondary | ICD-10-CM | POA: Diagnosis not present

## 2020-07-15 DIAGNOSIS — Z960 Presence of urogenital implants: Secondary | ICD-10-CM | POA: Diagnosis not present

## 2020-07-15 DIAGNOSIS — I10 Essential (primary) hypertension: Secondary | ICD-10-CM | POA: Diagnosis not present

## 2020-07-16 DIAGNOSIS — R338 Other retention of urine: Secondary | ICD-10-CM | POA: Diagnosis not present

## 2020-07-16 DIAGNOSIS — F419 Anxiety disorder, unspecified: Secondary | ICD-10-CM | POA: Diagnosis not present

## 2020-07-16 DIAGNOSIS — C3431 Malignant neoplasm of lower lobe, right bronchus or lung: Secondary | ICD-10-CM | POA: Diagnosis not present

## 2020-07-16 DIAGNOSIS — I10 Essential (primary) hypertension: Secondary | ICD-10-CM | POA: Diagnosis not present

## 2020-07-16 DIAGNOSIS — Z960 Presence of urogenital implants: Secondary | ICD-10-CM | POA: Diagnosis not present

## 2020-07-16 DIAGNOSIS — N401 Enlarged prostate with lower urinary tract symptoms: Secondary | ICD-10-CM | POA: Diagnosis not present

## 2020-07-17 DIAGNOSIS — I10 Essential (primary) hypertension: Secondary | ICD-10-CM | POA: Diagnosis not present

## 2020-07-17 DIAGNOSIS — C3431 Malignant neoplasm of lower lobe, right bronchus or lung: Secondary | ICD-10-CM | POA: Diagnosis not present

## 2020-07-17 DIAGNOSIS — R338 Other retention of urine: Secondary | ICD-10-CM | POA: Diagnosis not present

## 2020-07-17 DIAGNOSIS — N401 Enlarged prostate with lower urinary tract symptoms: Secondary | ICD-10-CM | POA: Diagnosis not present

## 2020-07-17 DIAGNOSIS — F419 Anxiety disorder, unspecified: Secondary | ICD-10-CM | POA: Diagnosis not present

## 2020-07-17 DIAGNOSIS — Z960 Presence of urogenital implants: Secondary | ICD-10-CM | POA: Diagnosis not present

## 2020-07-20 DIAGNOSIS — C3431 Malignant neoplasm of lower lobe, right bronchus or lung: Secondary | ICD-10-CM | POA: Diagnosis not present

## 2020-07-20 DIAGNOSIS — Z960 Presence of urogenital implants: Secondary | ICD-10-CM | POA: Diagnosis not present

## 2020-07-20 DIAGNOSIS — N401 Enlarged prostate with lower urinary tract symptoms: Secondary | ICD-10-CM | POA: Diagnosis not present

## 2020-07-20 DIAGNOSIS — R338 Other retention of urine: Secondary | ICD-10-CM | POA: Diagnosis not present

## 2020-07-20 DIAGNOSIS — I10 Essential (primary) hypertension: Secondary | ICD-10-CM | POA: Diagnosis not present

## 2020-07-20 DIAGNOSIS — F419 Anxiety disorder, unspecified: Secondary | ICD-10-CM | POA: Diagnosis not present

## 2020-07-22 DIAGNOSIS — R338 Other retention of urine: Secondary | ICD-10-CM | POA: Diagnosis not present

## 2020-07-22 DIAGNOSIS — I10 Essential (primary) hypertension: Secondary | ICD-10-CM | POA: Diagnosis not present

## 2020-07-22 DIAGNOSIS — Z960 Presence of urogenital implants: Secondary | ICD-10-CM | POA: Diagnosis not present

## 2020-07-22 DIAGNOSIS — C3431 Malignant neoplasm of lower lobe, right bronchus or lung: Secondary | ICD-10-CM | POA: Diagnosis not present

## 2020-07-22 DIAGNOSIS — N401 Enlarged prostate with lower urinary tract symptoms: Secondary | ICD-10-CM | POA: Diagnosis not present

## 2020-07-22 DIAGNOSIS — F419 Anxiety disorder, unspecified: Secondary | ICD-10-CM | POA: Diagnosis not present

## 2020-07-23 DIAGNOSIS — Z960 Presence of urogenital implants: Secondary | ICD-10-CM | POA: Diagnosis not present

## 2020-07-23 DIAGNOSIS — N401 Enlarged prostate with lower urinary tract symptoms: Secondary | ICD-10-CM | POA: Diagnosis not present

## 2020-07-23 DIAGNOSIS — R338 Other retention of urine: Secondary | ICD-10-CM | POA: Diagnosis not present

## 2020-07-23 DIAGNOSIS — C3431 Malignant neoplasm of lower lobe, right bronchus or lung: Secondary | ICD-10-CM | POA: Diagnosis not present

## 2020-07-23 DIAGNOSIS — F419 Anxiety disorder, unspecified: Secondary | ICD-10-CM | POA: Diagnosis not present

## 2020-07-23 DIAGNOSIS — I10 Essential (primary) hypertension: Secondary | ICD-10-CM | POA: Diagnosis not present

## 2020-07-27 DIAGNOSIS — N401 Enlarged prostate with lower urinary tract symptoms: Secondary | ICD-10-CM | POA: Diagnosis not present

## 2020-07-27 DIAGNOSIS — F419 Anxiety disorder, unspecified: Secondary | ICD-10-CM | POA: Diagnosis not present

## 2020-07-27 DIAGNOSIS — Z960 Presence of urogenital implants: Secondary | ICD-10-CM | POA: Diagnosis not present

## 2020-07-27 DIAGNOSIS — C3431 Malignant neoplasm of lower lobe, right bronchus or lung: Secondary | ICD-10-CM | POA: Diagnosis not present

## 2020-07-27 DIAGNOSIS — R338 Other retention of urine: Secondary | ICD-10-CM | POA: Diagnosis not present

## 2020-07-27 DIAGNOSIS — I10 Essential (primary) hypertension: Secondary | ICD-10-CM | POA: Diagnosis not present

## 2020-07-28 DIAGNOSIS — I10 Essential (primary) hypertension: Secondary | ICD-10-CM | POA: Diagnosis not present

## 2020-07-28 DIAGNOSIS — Z960 Presence of urogenital implants: Secondary | ICD-10-CM | POA: Diagnosis not present

## 2020-07-28 DIAGNOSIS — C3431 Malignant neoplasm of lower lobe, right bronchus or lung: Secondary | ICD-10-CM | POA: Diagnosis not present

## 2020-07-28 DIAGNOSIS — F419 Anxiety disorder, unspecified: Secondary | ICD-10-CM | POA: Diagnosis not present

## 2020-07-28 DIAGNOSIS — R338 Other retention of urine: Secondary | ICD-10-CM | POA: Diagnosis not present

## 2020-07-28 DIAGNOSIS — N401 Enlarged prostate with lower urinary tract symptoms: Secondary | ICD-10-CM | POA: Diagnosis not present

## 2020-07-29 DIAGNOSIS — N401 Enlarged prostate with lower urinary tract symptoms: Secondary | ICD-10-CM | POA: Diagnosis not present

## 2020-07-29 DIAGNOSIS — I10 Essential (primary) hypertension: Secondary | ICD-10-CM | POA: Diagnosis not present

## 2020-07-29 DIAGNOSIS — Z960 Presence of urogenital implants: Secondary | ICD-10-CM | POA: Diagnosis not present

## 2020-07-29 DIAGNOSIS — C3431 Malignant neoplasm of lower lobe, right bronchus or lung: Secondary | ICD-10-CM | POA: Diagnosis not present

## 2020-07-29 DIAGNOSIS — R338 Other retention of urine: Secondary | ICD-10-CM | POA: Diagnosis not present

## 2020-07-29 DIAGNOSIS — F419 Anxiety disorder, unspecified: Secondary | ICD-10-CM | POA: Diagnosis not present

## 2020-07-31 DIAGNOSIS — I10 Essential (primary) hypertension: Secondary | ICD-10-CM | POA: Diagnosis not present

## 2020-07-31 DIAGNOSIS — Z960 Presence of urogenital implants: Secondary | ICD-10-CM | POA: Diagnosis not present

## 2020-07-31 DIAGNOSIS — N401 Enlarged prostate with lower urinary tract symptoms: Secondary | ICD-10-CM | POA: Diagnosis not present

## 2020-07-31 DIAGNOSIS — C3431 Malignant neoplasm of lower lobe, right bronchus or lung: Secondary | ICD-10-CM | POA: Diagnosis not present

## 2020-07-31 DIAGNOSIS — F419 Anxiety disorder, unspecified: Secondary | ICD-10-CM | POA: Diagnosis not present

## 2020-07-31 DIAGNOSIS — R338 Other retention of urine: Secondary | ICD-10-CM | POA: Diagnosis not present

## 2020-08-03 DIAGNOSIS — I10 Essential (primary) hypertension: Secondary | ICD-10-CM | POA: Diagnosis not present

## 2020-08-03 DIAGNOSIS — C3431 Malignant neoplasm of lower lobe, right bronchus or lung: Secondary | ICD-10-CM | POA: Diagnosis not present

## 2020-08-03 DIAGNOSIS — F419 Anxiety disorder, unspecified: Secondary | ICD-10-CM | POA: Diagnosis not present

## 2020-08-03 DIAGNOSIS — R338 Other retention of urine: Secondary | ICD-10-CM | POA: Diagnosis not present

## 2020-08-03 DIAGNOSIS — Z960 Presence of urogenital implants: Secondary | ICD-10-CM | POA: Diagnosis not present

## 2020-08-03 DIAGNOSIS — N401 Enlarged prostate with lower urinary tract symptoms: Secondary | ICD-10-CM | POA: Diagnosis not present

## 2020-08-04 DIAGNOSIS — F419 Anxiety disorder, unspecified: Secondary | ICD-10-CM | POA: Diagnosis not present

## 2020-08-04 DIAGNOSIS — C3431 Malignant neoplasm of lower lobe, right bronchus or lung: Secondary | ICD-10-CM | POA: Diagnosis not present

## 2020-08-04 DIAGNOSIS — Z960 Presence of urogenital implants: Secondary | ICD-10-CM | POA: Diagnosis not present

## 2020-08-04 DIAGNOSIS — I10 Essential (primary) hypertension: Secondary | ICD-10-CM | POA: Diagnosis not present

## 2020-08-04 DIAGNOSIS — R338 Other retention of urine: Secondary | ICD-10-CM | POA: Diagnosis not present

## 2020-08-04 DIAGNOSIS — N401 Enlarged prostate with lower urinary tract symptoms: Secondary | ICD-10-CM | POA: Diagnosis not present

## 2020-08-05 DIAGNOSIS — I82722 Chronic embolism and thrombosis of deep veins of left upper extremity: Secondary | ICD-10-CM | POA: Diagnosis not present

## 2020-08-05 DIAGNOSIS — Z87891 Personal history of nicotine dependence: Secondary | ICD-10-CM | POA: Diagnosis not present

## 2020-08-05 DIAGNOSIS — Z8719 Personal history of other diseases of the digestive system: Secondary | ICD-10-CM | POA: Diagnosis not present

## 2020-08-05 DIAGNOSIS — N401 Enlarged prostate with lower urinary tract symptoms: Secondary | ICD-10-CM | POA: Diagnosis not present

## 2020-08-05 DIAGNOSIS — M1990 Unspecified osteoarthritis, unspecified site: Secondary | ICD-10-CM | POA: Diagnosis not present

## 2020-08-05 DIAGNOSIS — D630 Anemia in neoplastic disease: Secondary | ICD-10-CM | POA: Diagnosis not present

## 2020-08-05 DIAGNOSIS — E871 Hypo-osmolality and hyponatremia: Secondary | ICD-10-CM | POA: Diagnosis not present

## 2020-08-05 DIAGNOSIS — Z681 Body mass index (BMI) 19 or less, adult: Secondary | ICD-10-CM | POA: Diagnosis not present

## 2020-08-05 DIAGNOSIS — Z7401 Bed confinement status: Secondary | ICD-10-CM | POA: Diagnosis not present

## 2020-08-05 DIAGNOSIS — Z8781 Personal history of (healed) traumatic fracture: Secondary | ICD-10-CM | POA: Diagnosis not present

## 2020-08-05 DIAGNOSIS — K219 Gastro-esophageal reflux disease without esophagitis: Secondary | ICD-10-CM | POA: Diagnosis not present

## 2020-08-05 DIAGNOSIS — I10 Essential (primary) hypertension: Secondary | ICD-10-CM | POA: Diagnosis not present

## 2020-08-05 DIAGNOSIS — Z960 Presence of urogenital implants: Secondary | ICD-10-CM | POA: Diagnosis not present

## 2020-08-05 DIAGNOSIS — F329 Major depressive disorder, single episode, unspecified: Secondary | ICD-10-CM | POA: Diagnosis not present

## 2020-08-05 DIAGNOSIS — F419 Anxiety disorder, unspecified: Secondary | ICD-10-CM | POA: Diagnosis not present

## 2020-08-05 DIAGNOSIS — Z9181 History of falling: Secondary | ICD-10-CM | POA: Diagnosis not present

## 2020-08-05 DIAGNOSIS — R634 Abnormal weight loss: Secondary | ICD-10-CM | POA: Diagnosis not present

## 2020-08-05 DIAGNOSIS — S72002D Fracture of unspecified part of neck of left femur, subsequent encounter for closed fracture with routine healing: Secondary | ICD-10-CM | POA: Diagnosis not present

## 2020-08-05 DIAGNOSIS — C3431 Malignant neoplasm of lower lobe, right bronchus or lung: Secondary | ICD-10-CM | POA: Diagnosis not present

## 2020-08-05 DIAGNOSIS — Z87438 Personal history of other diseases of male genital organs: Secondary | ICD-10-CM | POA: Diagnosis not present

## 2020-08-05 DIAGNOSIS — E46 Unspecified protein-calorie malnutrition: Secondary | ICD-10-CM | POA: Diagnosis not present

## 2020-08-05 DIAGNOSIS — R338 Other retention of urine: Secondary | ICD-10-CM | POA: Diagnosis not present

## 2020-08-05 DIAGNOSIS — Z8701 Personal history of pneumonia (recurrent): Secondary | ICD-10-CM | POA: Diagnosis not present

## 2020-08-05 DIAGNOSIS — R627 Adult failure to thrive: Secondary | ICD-10-CM | POA: Diagnosis not present

## 2020-08-05 DIAGNOSIS — Z933 Colostomy status: Secondary | ICD-10-CM | POA: Diagnosis not present

## 2020-08-06 DIAGNOSIS — C3431 Malignant neoplasm of lower lobe, right bronchus or lung: Secondary | ICD-10-CM | POA: Diagnosis not present

## 2020-08-06 DIAGNOSIS — I10 Essential (primary) hypertension: Secondary | ICD-10-CM | POA: Diagnosis not present

## 2020-08-06 DIAGNOSIS — Z960 Presence of urogenital implants: Secondary | ICD-10-CM | POA: Diagnosis not present

## 2020-08-06 DIAGNOSIS — R338 Other retention of urine: Secondary | ICD-10-CM | POA: Diagnosis not present

## 2020-08-06 DIAGNOSIS — F419 Anxiety disorder, unspecified: Secondary | ICD-10-CM | POA: Diagnosis not present

## 2020-08-06 DIAGNOSIS — N401 Enlarged prostate with lower urinary tract symptoms: Secondary | ICD-10-CM | POA: Diagnosis not present

## 2020-08-07 DIAGNOSIS — R338 Other retention of urine: Secondary | ICD-10-CM | POA: Diagnosis not present

## 2020-08-07 DIAGNOSIS — I10 Essential (primary) hypertension: Secondary | ICD-10-CM | POA: Diagnosis not present

## 2020-08-07 DIAGNOSIS — F419 Anxiety disorder, unspecified: Secondary | ICD-10-CM | POA: Diagnosis not present

## 2020-08-07 DIAGNOSIS — Z960 Presence of urogenital implants: Secondary | ICD-10-CM | POA: Diagnosis not present

## 2020-08-07 DIAGNOSIS — N401 Enlarged prostate with lower urinary tract symptoms: Secondary | ICD-10-CM | POA: Diagnosis not present

## 2020-08-07 DIAGNOSIS — C3431 Malignant neoplasm of lower lobe, right bronchus or lung: Secondary | ICD-10-CM | POA: Diagnosis not present

## 2020-08-10 DIAGNOSIS — C3431 Malignant neoplasm of lower lobe, right bronchus or lung: Secondary | ICD-10-CM | POA: Diagnosis not present

## 2020-08-10 DIAGNOSIS — R338 Other retention of urine: Secondary | ICD-10-CM | POA: Diagnosis not present

## 2020-08-10 DIAGNOSIS — I10 Essential (primary) hypertension: Secondary | ICD-10-CM | POA: Diagnosis not present

## 2020-08-10 DIAGNOSIS — Z960 Presence of urogenital implants: Secondary | ICD-10-CM | POA: Diagnosis not present

## 2020-08-10 DIAGNOSIS — F419 Anxiety disorder, unspecified: Secondary | ICD-10-CM | POA: Diagnosis not present

## 2020-08-10 DIAGNOSIS — N401 Enlarged prostate with lower urinary tract symptoms: Secondary | ICD-10-CM | POA: Diagnosis not present

## 2020-08-11 DIAGNOSIS — R338 Other retention of urine: Secondary | ICD-10-CM | POA: Diagnosis not present

## 2020-08-11 DIAGNOSIS — C3431 Malignant neoplasm of lower lobe, right bronchus or lung: Secondary | ICD-10-CM | POA: Diagnosis not present

## 2020-08-11 DIAGNOSIS — Z960 Presence of urogenital implants: Secondary | ICD-10-CM | POA: Diagnosis not present

## 2020-08-11 DIAGNOSIS — N401 Enlarged prostate with lower urinary tract symptoms: Secondary | ICD-10-CM | POA: Diagnosis not present

## 2020-08-11 DIAGNOSIS — F419 Anxiety disorder, unspecified: Secondary | ICD-10-CM | POA: Diagnosis not present

## 2020-08-11 DIAGNOSIS — I10 Essential (primary) hypertension: Secondary | ICD-10-CM | POA: Diagnosis not present

## 2020-08-13 DIAGNOSIS — Z933 Colostomy status: Secondary | ICD-10-CM | POA: Diagnosis not present

## 2020-08-13 DIAGNOSIS — K5792 Diverticulitis of intestine, part unspecified, without perforation or abscess without bleeding: Secondary | ICD-10-CM | POA: Diagnosis not present

## 2020-08-13 DIAGNOSIS — N400 Enlarged prostate without lower urinary tract symptoms: Secondary | ICD-10-CM | POA: Diagnosis not present

## 2020-08-13 DIAGNOSIS — I82622 Acute embolism and thrombosis of deep veins of left upper extremity: Secondary | ICD-10-CM | POA: Diagnosis not present

## 2020-08-13 DIAGNOSIS — E46 Unspecified protein-calorie malnutrition: Secondary | ICD-10-CM | POA: Diagnosis not present

## 2020-08-13 DIAGNOSIS — C7970 Secondary malignant neoplasm of unspecified adrenal gland: Secondary | ICD-10-CM | POA: Diagnosis not present

## 2020-08-13 DIAGNOSIS — E039 Hypothyroidism, unspecified: Secondary | ICD-10-CM | POA: Diagnosis not present

## 2020-08-13 DIAGNOSIS — Z8616 Personal history of COVID-19: Secondary | ICD-10-CM | POA: Diagnosis not present

## 2020-08-13 DIAGNOSIS — C3490 Malignant neoplasm of unspecified part of unspecified bronchus or lung: Secondary | ICD-10-CM | POA: Diagnosis not present

## 2020-08-14 DIAGNOSIS — I82622 Acute embolism and thrombosis of deep veins of left upper extremity: Secondary | ICD-10-CM | POA: Diagnosis not present

## 2020-08-14 DIAGNOSIS — E039 Hypothyroidism, unspecified: Secondary | ICD-10-CM | POA: Diagnosis not present

## 2020-08-14 DIAGNOSIS — C7970 Secondary malignant neoplasm of unspecified adrenal gland: Secondary | ICD-10-CM | POA: Diagnosis not present

## 2020-08-14 DIAGNOSIS — C3490 Malignant neoplasm of unspecified part of unspecified bronchus or lung: Secondary | ICD-10-CM | POA: Diagnosis not present

## 2020-08-14 DIAGNOSIS — E46 Unspecified protein-calorie malnutrition: Secondary | ICD-10-CM | POA: Diagnosis not present

## 2020-08-14 DIAGNOSIS — N400 Enlarged prostate without lower urinary tract symptoms: Secondary | ICD-10-CM | POA: Diagnosis not present

## 2020-08-15 DIAGNOSIS — C3490 Malignant neoplasm of unspecified part of unspecified bronchus or lung: Secondary | ICD-10-CM | POA: Diagnosis not present

## 2020-08-15 DIAGNOSIS — E46 Unspecified protein-calorie malnutrition: Secondary | ICD-10-CM | POA: Diagnosis not present

## 2020-08-15 DIAGNOSIS — C7970 Secondary malignant neoplasm of unspecified adrenal gland: Secondary | ICD-10-CM | POA: Diagnosis not present

## 2020-08-15 DIAGNOSIS — I82622 Acute embolism and thrombosis of deep veins of left upper extremity: Secondary | ICD-10-CM | POA: Diagnosis not present

## 2020-08-15 DIAGNOSIS — N400 Enlarged prostate without lower urinary tract symptoms: Secondary | ICD-10-CM | POA: Diagnosis not present

## 2020-08-15 DIAGNOSIS — E039 Hypothyroidism, unspecified: Secondary | ICD-10-CM | POA: Diagnosis not present

## 2020-08-17 DIAGNOSIS — C7970 Secondary malignant neoplasm of unspecified adrenal gland: Secondary | ICD-10-CM | POA: Diagnosis not present

## 2020-08-17 DIAGNOSIS — E039 Hypothyroidism, unspecified: Secondary | ICD-10-CM | POA: Diagnosis not present

## 2020-08-17 DIAGNOSIS — N400 Enlarged prostate without lower urinary tract symptoms: Secondary | ICD-10-CM | POA: Diagnosis not present

## 2020-08-17 DIAGNOSIS — I82622 Acute embolism and thrombosis of deep veins of left upper extremity: Secondary | ICD-10-CM | POA: Diagnosis not present

## 2020-08-17 DIAGNOSIS — E46 Unspecified protein-calorie malnutrition: Secondary | ICD-10-CM | POA: Diagnosis not present

## 2020-08-17 DIAGNOSIS — C3490 Malignant neoplasm of unspecified part of unspecified bronchus or lung: Secondary | ICD-10-CM | POA: Diagnosis not present

## 2020-08-18 DIAGNOSIS — N400 Enlarged prostate without lower urinary tract symptoms: Secondary | ICD-10-CM | POA: Diagnosis not present

## 2020-08-18 DIAGNOSIS — E46 Unspecified protein-calorie malnutrition: Secondary | ICD-10-CM | POA: Diagnosis not present

## 2020-08-18 DIAGNOSIS — C3490 Malignant neoplasm of unspecified part of unspecified bronchus or lung: Secondary | ICD-10-CM | POA: Diagnosis not present

## 2020-08-18 DIAGNOSIS — E039 Hypothyroidism, unspecified: Secondary | ICD-10-CM | POA: Diagnosis not present

## 2020-08-18 DIAGNOSIS — C7970 Secondary malignant neoplasm of unspecified adrenal gland: Secondary | ICD-10-CM | POA: Diagnosis not present

## 2020-08-18 DIAGNOSIS — I82622 Acute embolism and thrombosis of deep veins of left upper extremity: Secondary | ICD-10-CM | POA: Diagnosis not present

## 2020-08-20 DIAGNOSIS — E46 Unspecified protein-calorie malnutrition: Secondary | ICD-10-CM | POA: Diagnosis not present

## 2020-08-20 DIAGNOSIS — N400 Enlarged prostate without lower urinary tract symptoms: Secondary | ICD-10-CM | POA: Diagnosis not present

## 2020-08-20 DIAGNOSIS — C7970 Secondary malignant neoplasm of unspecified adrenal gland: Secondary | ICD-10-CM | POA: Diagnosis not present

## 2020-08-20 DIAGNOSIS — I82622 Acute embolism and thrombosis of deep veins of left upper extremity: Secondary | ICD-10-CM | POA: Diagnosis not present

## 2020-08-20 DIAGNOSIS — E039 Hypothyroidism, unspecified: Secondary | ICD-10-CM | POA: Diagnosis not present

## 2020-08-20 DIAGNOSIS — C3490 Malignant neoplasm of unspecified part of unspecified bronchus or lung: Secondary | ICD-10-CM | POA: Diagnosis not present

## 2020-08-21 DIAGNOSIS — C3490 Malignant neoplasm of unspecified part of unspecified bronchus or lung: Secondary | ICD-10-CM | POA: Diagnosis not present

## 2020-08-21 DIAGNOSIS — E46 Unspecified protein-calorie malnutrition: Secondary | ICD-10-CM | POA: Diagnosis not present

## 2020-08-21 DIAGNOSIS — N400 Enlarged prostate without lower urinary tract symptoms: Secondary | ICD-10-CM | POA: Diagnosis not present

## 2020-08-21 DIAGNOSIS — E039 Hypothyroidism, unspecified: Secondary | ICD-10-CM | POA: Diagnosis not present

## 2020-08-21 DIAGNOSIS — I82622 Acute embolism and thrombosis of deep veins of left upper extremity: Secondary | ICD-10-CM | POA: Diagnosis not present

## 2020-08-21 DIAGNOSIS — C7970 Secondary malignant neoplasm of unspecified adrenal gland: Secondary | ICD-10-CM | POA: Diagnosis not present

## 2020-08-22 DIAGNOSIS — N400 Enlarged prostate without lower urinary tract symptoms: Secondary | ICD-10-CM | POA: Diagnosis not present

## 2020-08-22 DIAGNOSIS — C7970 Secondary malignant neoplasm of unspecified adrenal gland: Secondary | ICD-10-CM | POA: Diagnosis not present

## 2020-08-22 DIAGNOSIS — C3490 Malignant neoplasm of unspecified part of unspecified bronchus or lung: Secondary | ICD-10-CM | POA: Diagnosis not present

## 2020-08-22 DIAGNOSIS — E039 Hypothyroidism, unspecified: Secondary | ICD-10-CM | POA: Diagnosis not present

## 2020-08-22 DIAGNOSIS — E46 Unspecified protein-calorie malnutrition: Secondary | ICD-10-CM | POA: Diagnosis not present

## 2020-08-22 DIAGNOSIS — I82622 Acute embolism and thrombosis of deep veins of left upper extremity: Secondary | ICD-10-CM | POA: Diagnosis not present

## 2020-08-24 DIAGNOSIS — N400 Enlarged prostate without lower urinary tract symptoms: Secondary | ICD-10-CM | POA: Diagnosis not present

## 2020-08-24 DIAGNOSIS — C3490 Malignant neoplasm of unspecified part of unspecified bronchus or lung: Secondary | ICD-10-CM | POA: Diagnosis not present

## 2020-08-24 DIAGNOSIS — E46 Unspecified protein-calorie malnutrition: Secondary | ICD-10-CM | POA: Diagnosis not present

## 2020-08-24 DIAGNOSIS — I82622 Acute embolism and thrombosis of deep veins of left upper extremity: Secondary | ICD-10-CM | POA: Diagnosis not present

## 2020-08-24 DIAGNOSIS — E039 Hypothyroidism, unspecified: Secondary | ICD-10-CM | POA: Diagnosis not present

## 2020-08-24 DIAGNOSIS — C7970 Secondary malignant neoplasm of unspecified adrenal gland: Secondary | ICD-10-CM | POA: Diagnosis not present

## 2020-08-26 DIAGNOSIS — E039 Hypothyroidism, unspecified: Secondary | ICD-10-CM | POA: Diagnosis not present

## 2020-08-26 DIAGNOSIS — E46 Unspecified protein-calorie malnutrition: Secondary | ICD-10-CM | POA: Diagnosis not present

## 2020-08-26 DIAGNOSIS — C3490 Malignant neoplasm of unspecified part of unspecified bronchus or lung: Secondary | ICD-10-CM | POA: Diagnosis not present

## 2020-08-26 DIAGNOSIS — C7970 Secondary malignant neoplasm of unspecified adrenal gland: Secondary | ICD-10-CM | POA: Diagnosis not present

## 2020-08-26 DIAGNOSIS — N400 Enlarged prostate without lower urinary tract symptoms: Secondary | ICD-10-CM | POA: Diagnosis not present

## 2020-08-26 DIAGNOSIS — I82622 Acute embolism and thrombosis of deep veins of left upper extremity: Secondary | ICD-10-CM | POA: Diagnosis not present

## 2020-08-27 DIAGNOSIS — C7970 Secondary malignant neoplasm of unspecified adrenal gland: Secondary | ICD-10-CM | POA: Diagnosis not present

## 2020-08-27 DIAGNOSIS — N400 Enlarged prostate without lower urinary tract symptoms: Secondary | ICD-10-CM | POA: Diagnosis not present

## 2020-08-27 DIAGNOSIS — E46 Unspecified protein-calorie malnutrition: Secondary | ICD-10-CM | POA: Diagnosis not present

## 2020-08-27 DIAGNOSIS — I82622 Acute embolism and thrombosis of deep veins of left upper extremity: Secondary | ICD-10-CM | POA: Diagnosis not present

## 2020-08-27 DIAGNOSIS — C3490 Malignant neoplasm of unspecified part of unspecified bronchus or lung: Secondary | ICD-10-CM | POA: Diagnosis not present

## 2020-08-27 DIAGNOSIS — E039 Hypothyroidism, unspecified: Secondary | ICD-10-CM | POA: Diagnosis not present

## 2020-08-28 DIAGNOSIS — C3490 Malignant neoplasm of unspecified part of unspecified bronchus or lung: Secondary | ICD-10-CM | POA: Diagnosis not present

## 2020-08-28 DIAGNOSIS — E039 Hypothyroidism, unspecified: Secondary | ICD-10-CM | POA: Diagnosis not present

## 2020-08-28 DIAGNOSIS — I82622 Acute embolism and thrombosis of deep veins of left upper extremity: Secondary | ICD-10-CM | POA: Diagnosis not present

## 2020-08-28 DIAGNOSIS — C7970 Secondary malignant neoplasm of unspecified adrenal gland: Secondary | ICD-10-CM | POA: Diagnosis not present

## 2020-08-28 DIAGNOSIS — N400 Enlarged prostate without lower urinary tract symptoms: Secondary | ICD-10-CM | POA: Diagnosis not present

## 2020-08-28 DIAGNOSIS — E46 Unspecified protein-calorie malnutrition: Secondary | ICD-10-CM | POA: Diagnosis not present

## 2020-08-31 DIAGNOSIS — I82622 Acute embolism and thrombosis of deep veins of left upper extremity: Secondary | ICD-10-CM | POA: Diagnosis not present

## 2020-08-31 DIAGNOSIS — C7970 Secondary malignant neoplasm of unspecified adrenal gland: Secondary | ICD-10-CM | POA: Diagnosis not present

## 2020-08-31 DIAGNOSIS — N400 Enlarged prostate without lower urinary tract symptoms: Secondary | ICD-10-CM | POA: Diagnosis not present

## 2020-08-31 DIAGNOSIS — E039 Hypothyroidism, unspecified: Secondary | ICD-10-CM | POA: Diagnosis not present

## 2020-08-31 DIAGNOSIS — E46 Unspecified protein-calorie malnutrition: Secondary | ICD-10-CM | POA: Diagnosis not present

## 2020-08-31 DIAGNOSIS — C3490 Malignant neoplasm of unspecified part of unspecified bronchus or lung: Secondary | ICD-10-CM | POA: Diagnosis not present

## 2020-09-02 DIAGNOSIS — C7970 Secondary malignant neoplasm of unspecified adrenal gland: Secondary | ICD-10-CM | POA: Diagnosis not present

## 2020-09-02 DIAGNOSIS — I82622 Acute embolism and thrombosis of deep veins of left upper extremity: Secondary | ICD-10-CM | POA: Diagnosis not present

## 2020-09-02 DIAGNOSIS — E46 Unspecified protein-calorie malnutrition: Secondary | ICD-10-CM | POA: Diagnosis not present

## 2020-09-02 DIAGNOSIS — E039 Hypothyroidism, unspecified: Secondary | ICD-10-CM | POA: Diagnosis not present

## 2020-09-02 DIAGNOSIS — C3490 Malignant neoplasm of unspecified part of unspecified bronchus or lung: Secondary | ICD-10-CM | POA: Diagnosis not present

## 2020-09-02 DIAGNOSIS — N400 Enlarged prostate without lower urinary tract symptoms: Secondary | ICD-10-CM | POA: Diagnosis not present

## 2020-09-03 DIAGNOSIS — C7970 Secondary malignant neoplasm of unspecified adrenal gland: Secondary | ICD-10-CM | POA: Diagnosis not present

## 2020-09-03 DIAGNOSIS — E46 Unspecified protein-calorie malnutrition: Secondary | ICD-10-CM | POA: Diagnosis not present

## 2020-09-03 DIAGNOSIS — N400 Enlarged prostate without lower urinary tract symptoms: Secondary | ICD-10-CM | POA: Diagnosis not present

## 2020-09-03 DIAGNOSIS — I82622 Acute embolism and thrombosis of deep veins of left upper extremity: Secondary | ICD-10-CM | POA: Diagnosis not present

## 2020-09-03 DIAGNOSIS — E039 Hypothyroidism, unspecified: Secondary | ICD-10-CM | POA: Diagnosis not present

## 2020-09-03 DIAGNOSIS — C3490 Malignant neoplasm of unspecified part of unspecified bronchus or lung: Secondary | ICD-10-CM | POA: Diagnosis not present

## 2020-09-04 DIAGNOSIS — C3490 Malignant neoplasm of unspecified part of unspecified bronchus or lung: Secondary | ICD-10-CM | POA: Diagnosis not present

## 2020-09-04 DIAGNOSIS — C7970 Secondary malignant neoplasm of unspecified adrenal gland: Secondary | ICD-10-CM | POA: Diagnosis not present

## 2020-09-04 DIAGNOSIS — Z933 Colostomy status: Secondary | ICD-10-CM | POA: Diagnosis not present

## 2020-09-04 DIAGNOSIS — K5792 Diverticulitis of intestine, part unspecified, without perforation or abscess without bleeding: Secondary | ICD-10-CM | POA: Diagnosis not present

## 2020-09-04 DIAGNOSIS — N400 Enlarged prostate without lower urinary tract symptoms: Secondary | ICD-10-CM | POA: Diagnosis not present

## 2020-09-04 DIAGNOSIS — I82622 Acute embolism and thrombosis of deep veins of left upper extremity: Secondary | ICD-10-CM | POA: Diagnosis not present

## 2020-09-04 DIAGNOSIS — Z8616 Personal history of COVID-19: Secondary | ICD-10-CM | POA: Diagnosis not present

## 2020-09-04 DIAGNOSIS — E039 Hypothyroidism, unspecified: Secondary | ICD-10-CM | POA: Diagnosis not present

## 2020-09-04 DIAGNOSIS — E46 Unspecified protein-calorie malnutrition: Secondary | ICD-10-CM | POA: Diagnosis not present

## 2020-09-07 DIAGNOSIS — I82409 Acute embolism and thrombosis of unspecified deep veins of unspecified lower extremity: Secondary | ICD-10-CM | POA: Diagnosis not present

## 2020-09-07 DIAGNOSIS — N400 Enlarged prostate without lower urinary tract symptoms: Secondary | ICD-10-CM | POA: Diagnosis not present

## 2020-09-07 DIAGNOSIS — E039 Hypothyroidism, unspecified: Secondary | ICD-10-CM | POA: Diagnosis not present

## 2020-09-07 DIAGNOSIS — C7970 Secondary malignant neoplasm of unspecified adrenal gland: Secondary | ICD-10-CM | POA: Diagnosis not present

## 2020-09-07 DIAGNOSIS — C3490 Malignant neoplasm of unspecified part of unspecified bronchus or lung: Secondary | ICD-10-CM | POA: Diagnosis not present

## 2020-09-08 DIAGNOSIS — C7970 Secondary malignant neoplasm of unspecified adrenal gland: Secondary | ICD-10-CM | POA: Diagnosis not present

## 2020-09-08 DIAGNOSIS — N400 Enlarged prostate without lower urinary tract symptoms: Secondary | ICD-10-CM | POA: Diagnosis not present

## 2020-09-08 DIAGNOSIS — C3490 Malignant neoplasm of unspecified part of unspecified bronchus or lung: Secondary | ICD-10-CM | POA: Diagnosis not present

## 2020-09-08 DIAGNOSIS — I82409 Acute embolism and thrombosis of unspecified deep veins of unspecified lower extremity: Secondary | ICD-10-CM | POA: Diagnosis not present

## 2020-09-08 DIAGNOSIS — E039 Hypothyroidism, unspecified: Secondary | ICD-10-CM | POA: Diagnosis not present

## 2020-09-09 DIAGNOSIS — C7970 Secondary malignant neoplasm of unspecified adrenal gland: Secondary | ICD-10-CM | POA: Diagnosis not present

## 2020-09-09 DIAGNOSIS — E039 Hypothyroidism, unspecified: Secondary | ICD-10-CM | POA: Diagnosis not present

## 2020-09-09 DIAGNOSIS — N400 Enlarged prostate without lower urinary tract symptoms: Secondary | ICD-10-CM | POA: Diagnosis not present

## 2020-09-09 DIAGNOSIS — C3490 Malignant neoplasm of unspecified part of unspecified bronchus or lung: Secondary | ICD-10-CM | POA: Diagnosis not present

## 2020-09-09 DIAGNOSIS — I82409 Acute embolism and thrombosis of unspecified deep veins of unspecified lower extremity: Secondary | ICD-10-CM | POA: Diagnosis not present

## 2020-09-10 DIAGNOSIS — C7970 Secondary malignant neoplasm of unspecified adrenal gland: Secondary | ICD-10-CM | POA: Diagnosis not present

## 2020-09-10 DIAGNOSIS — I82409 Acute embolism and thrombosis of unspecified deep veins of unspecified lower extremity: Secondary | ICD-10-CM | POA: Diagnosis not present

## 2020-09-10 DIAGNOSIS — C3490 Malignant neoplasm of unspecified part of unspecified bronchus or lung: Secondary | ICD-10-CM | POA: Diagnosis not present

## 2020-09-10 DIAGNOSIS — E039 Hypothyroidism, unspecified: Secondary | ICD-10-CM | POA: Diagnosis not present

## 2020-09-10 DIAGNOSIS — N400 Enlarged prostate without lower urinary tract symptoms: Secondary | ICD-10-CM | POA: Diagnosis not present

## 2020-09-11 DIAGNOSIS — C7970 Secondary malignant neoplasm of unspecified adrenal gland: Secondary | ICD-10-CM | POA: Diagnosis not present

## 2020-09-11 DIAGNOSIS — N400 Enlarged prostate without lower urinary tract symptoms: Secondary | ICD-10-CM | POA: Diagnosis not present

## 2020-09-11 DIAGNOSIS — I82409 Acute embolism and thrombosis of unspecified deep veins of unspecified lower extremity: Secondary | ICD-10-CM | POA: Diagnosis not present

## 2020-09-11 DIAGNOSIS — E039 Hypothyroidism, unspecified: Secondary | ICD-10-CM | POA: Diagnosis not present

## 2020-09-11 DIAGNOSIS — C3490 Malignant neoplasm of unspecified part of unspecified bronchus or lung: Secondary | ICD-10-CM | POA: Diagnosis not present

## 2020-09-14 DIAGNOSIS — C3490 Malignant neoplasm of unspecified part of unspecified bronchus or lung: Secondary | ICD-10-CM | POA: Diagnosis not present

## 2020-09-14 DIAGNOSIS — C7970 Secondary malignant neoplasm of unspecified adrenal gland: Secondary | ICD-10-CM | POA: Diagnosis not present

## 2020-09-14 DIAGNOSIS — E039 Hypothyroidism, unspecified: Secondary | ICD-10-CM | POA: Diagnosis not present

## 2020-09-14 DIAGNOSIS — I82409 Acute embolism and thrombosis of unspecified deep veins of unspecified lower extremity: Secondary | ICD-10-CM | POA: Diagnosis not present

## 2020-09-14 DIAGNOSIS — N400 Enlarged prostate without lower urinary tract symptoms: Secondary | ICD-10-CM | POA: Diagnosis not present

## 2020-09-15 DIAGNOSIS — C7970 Secondary malignant neoplasm of unspecified adrenal gland: Secondary | ICD-10-CM | POA: Diagnosis not present

## 2020-09-15 DIAGNOSIS — N400 Enlarged prostate without lower urinary tract symptoms: Secondary | ICD-10-CM | POA: Diagnosis not present

## 2020-09-15 DIAGNOSIS — I82409 Acute embolism and thrombosis of unspecified deep veins of unspecified lower extremity: Secondary | ICD-10-CM | POA: Diagnosis not present

## 2020-09-15 DIAGNOSIS — E039 Hypothyroidism, unspecified: Secondary | ICD-10-CM | POA: Diagnosis not present

## 2020-09-15 DIAGNOSIS — C3490 Malignant neoplasm of unspecified part of unspecified bronchus or lung: Secondary | ICD-10-CM | POA: Diagnosis not present

## 2020-09-16 DIAGNOSIS — I82409 Acute embolism and thrombosis of unspecified deep veins of unspecified lower extremity: Secondary | ICD-10-CM | POA: Diagnosis not present

## 2020-09-16 DIAGNOSIS — C7970 Secondary malignant neoplasm of unspecified adrenal gland: Secondary | ICD-10-CM | POA: Diagnosis not present

## 2020-09-16 DIAGNOSIS — C3490 Malignant neoplasm of unspecified part of unspecified bronchus or lung: Secondary | ICD-10-CM | POA: Diagnosis not present

## 2020-09-16 DIAGNOSIS — E039 Hypothyroidism, unspecified: Secondary | ICD-10-CM | POA: Diagnosis not present

## 2020-09-16 DIAGNOSIS — N400 Enlarged prostate without lower urinary tract symptoms: Secondary | ICD-10-CM | POA: Diagnosis not present

## 2020-09-17 DIAGNOSIS — N400 Enlarged prostate without lower urinary tract symptoms: Secondary | ICD-10-CM | POA: Diagnosis not present

## 2020-09-17 DIAGNOSIS — E039 Hypothyroidism, unspecified: Secondary | ICD-10-CM | POA: Diagnosis not present

## 2020-09-17 DIAGNOSIS — C3490 Malignant neoplasm of unspecified part of unspecified bronchus or lung: Secondary | ICD-10-CM | POA: Diagnosis not present

## 2020-09-17 DIAGNOSIS — C7970 Secondary malignant neoplasm of unspecified adrenal gland: Secondary | ICD-10-CM | POA: Diagnosis not present

## 2020-09-17 DIAGNOSIS — I82409 Acute embolism and thrombosis of unspecified deep veins of unspecified lower extremity: Secondary | ICD-10-CM | POA: Diagnosis not present

## 2020-09-18 DIAGNOSIS — C3490 Malignant neoplasm of unspecified part of unspecified bronchus or lung: Secondary | ICD-10-CM | POA: Diagnosis not present

## 2020-09-18 DIAGNOSIS — N400 Enlarged prostate without lower urinary tract symptoms: Secondary | ICD-10-CM | POA: Diagnosis not present

## 2020-09-18 DIAGNOSIS — I82409 Acute embolism and thrombosis of unspecified deep veins of unspecified lower extremity: Secondary | ICD-10-CM | POA: Diagnosis not present

## 2020-09-18 DIAGNOSIS — C7970 Secondary malignant neoplasm of unspecified adrenal gland: Secondary | ICD-10-CM | POA: Diagnosis not present

## 2020-09-18 DIAGNOSIS — E039 Hypothyroidism, unspecified: Secondary | ICD-10-CM | POA: Diagnosis not present

## 2020-09-21 DIAGNOSIS — N400 Enlarged prostate without lower urinary tract symptoms: Secondary | ICD-10-CM | POA: Diagnosis not present

## 2020-09-21 DIAGNOSIS — I82409 Acute embolism and thrombosis of unspecified deep veins of unspecified lower extremity: Secondary | ICD-10-CM | POA: Diagnosis not present

## 2020-09-21 DIAGNOSIS — C3490 Malignant neoplasm of unspecified part of unspecified bronchus or lung: Secondary | ICD-10-CM | POA: Diagnosis not present

## 2020-09-21 DIAGNOSIS — E039 Hypothyroidism, unspecified: Secondary | ICD-10-CM | POA: Diagnosis not present

## 2020-09-21 DIAGNOSIS — C7970 Secondary malignant neoplasm of unspecified adrenal gland: Secondary | ICD-10-CM | POA: Diagnosis not present

## 2020-09-22 DIAGNOSIS — C7970 Secondary malignant neoplasm of unspecified adrenal gland: Secondary | ICD-10-CM | POA: Diagnosis not present

## 2020-09-22 DIAGNOSIS — E039 Hypothyroidism, unspecified: Secondary | ICD-10-CM | POA: Diagnosis not present

## 2020-09-22 DIAGNOSIS — C3490 Malignant neoplasm of unspecified part of unspecified bronchus or lung: Secondary | ICD-10-CM | POA: Diagnosis not present

## 2020-09-22 DIAGNOSIS — N400 Enlarged prostate without lower urinary tract symptoms: Secondary | ICD-10-CM | POA: Diagnosis not present

## 2020-09-22 DIAGNOSIS — I82409 Acute embolism and thrombosis of unspecified deep veins of unspecified lower extremity: Secondary | ICD-10-CM | POA: Diagnosis not present

## 2020-09-23 DIAGNOSIS — C7970 Secondary malignant neoplasm of unspecified adrenal gland: Secondary | ICD-10-CM | POA: Diagnosis not present

## 2020-09-23 DIAGNOSIS — N400 Enlarged prostate without lower urinary tract symptoms: Secondary | ICD-10-CM | POA: Diagnosis not present

## 2020-09-23 DIAGNOSIS — I82409 Acute embolism and thrombosis of unspecified deep veins of unspecified lower extremity: Secondary | ICD-10-CM | POA: Diagnosis not present

## 2020-09-23 DIAGNOSIS — E039 Hypothyroidism, unspecified: Secondary | ICD-10-CM | POA: Diagnosis not present

## 2020-09-23 DIAGNOSIS — C3490 Malignant neoplasm of unspecified part of unspecified bronchus or lung: Secondary | ICD-10-CM | POA: Diagnosis not present

## 2020-09-24 DIAGNOSIS — I82409 Acute embolism and thrombosis of unspecified deep veins of unspecified lower extremity: Secondary | ICD-10-CM | POA: Diagnosis not present

## 2020-09-24 DIAGNOSIS — C3490 Malignant neoplasm of unspecified part of unspecified bronchus or lung: Secondary | ICD-10-CM | POA: Diagnosis not present

## 2020-09-24 DIAGNOSIS — E039 Hypothyroidism, unspecified: Secondary | ICD-10-CM | POA: Diagnosis not present

## 2020-09-24 DIAGNOSIS — N400 Enlarged prostate without lower urinary tract symptoms: Secondary | ICD-10-CM | POA: Diagnosis not present

## 2020-09-24 DIAGNOSIS — C7970 Secondary malignant neoplasm of unspecified adrenal gland: Secondary | ICD-10-CM | POA: Diagnosis not present

## 2020-09-28 DIAGNOSIS — C7970 Secondary malignant neoplasm of unspecified adrenal gland: Secondary | ICD-10-CM | POA: Diagnosis not present

## 2020-09-28 DIAGNOSIS — N400 Enlarged prostate without lower urinary tract symptoms: Secondary | ICD-10-CM | POA: Diagnosis not present

## 2020-09-28 DIAGNOSIS — E039 Hypothyroidism, unspecified: Secondary | ICD-10-CM | POA: Diagnosis not present

## 2020-09-28 DIAGNOSIS — I82409 Acute embolism and thrombosis of unspecified deep veins of unspecified lower extremity: Secondary | ICD-10-CM | POA: Diagnosis not present

## 2020-09-28 DIAGNOSIS — C3490 Malignant neoplasm of unspecified part of unspecified bronchus or lung: Secondary | ICD-10-CM | POA: Diagnosis not present

## 2020-09-29 DIAGNOSIS — C7970 Secondary malignant neoplasm of unspecified adrenal gland: Secondary | ICD-10-CM | POA: Diagnosis not present

## 2020-09-29 DIAGNOSIS — I82409 Acute embolism and thrombosis of unspecified deep veins of unspecified lower extremity: Secondary | ICD-10-CM | POA: Diagnosis not present

## 2020-09-29 DIAGNOSIS — N400 Enlarged prostate without lower urinary tract symptoms: Secondary | ICD-10-CM | POA: Diagnosis not present

## 2020-09-29 DIAGNOSIS — C3490 Malignant neoplasm of unspecified part of unspecified bronchus or lung: Secondary | ICD-10-CM | POA: Diagnosis not present

## 2020-09-29 DIAGNOSIS — E039 Hypothyroidism, unspecified: Secondary | ICD-10-CM | POA: Diagnosis not present

## 2020-09-30 DIAGNOSIS — N400 Enlarged prostate without lower urinary tract symptoms: Secondary | ICD-10-CM | POA: Diagnosis not present

## 2020-09-30 DIAGNOSIS — I82409 Acute embolism and thrombosis of unspecified deep veins of unspecified lower extremity: Secondary | ICD-10-CM | POA: Diagnosis not present

## 2020-09-30 DIAGNOSIS — C3490 Malignant neoplasm of unspecified part of unspecified bronchus or lung: Secondary | ICD-10-CM | POA: Diagnosis not present

## 2020-09-30 DIAGNOSIS — C7970 Secondary malignant neoplasm of unspecified adrenal gland: Secondary | ICD-10-CM | POA: Diagnosis not present

## 2020-09-30 DIAGNOSIS — E039 Hypothyroidism, unspecified: Secondary | ICD-10-CM | POA: Diagnosis not present

## 2020-10-01 DIAGNOSIS — C7970 Secondary malignant neoplasm of unspecified adrenal gland: Secondary | ICD-10-CM | POA: Diagnosis not present

## 2020-10-01 DIAGNOSIS — C3490 Malignant neoplasm of unspecified part of unspecified bronchus or lung: Secondary | ICD-10-CM | POA: Diagnosis not present

## 2020-10-01 DIAGNOSIS — I82409 Acute embolism and thrombosis of unspecified deep veins of unspecified lower extremity: Secondary | ICD-10-CM | POA: Diagnosis not present

## 2020-10-01 DIAGNOSIS — N400 Enlarged prostate without lower urinary tract symptoms: Secondary | ICD-10-CM | POA: Diagnosis not present

## 2020-10-01 DIAGNOSIS — E039 Hypothyroidism, unspecified: Secondary | ICD-10-CM | POA: Diagnosis not present

## 2020-10-02 DIAGNOSIS — C7970 Secondary malignant neoplasm of unspecified adrenal gland: Secondary | ICD-10-CM | POA: Diagnosis not present

## 2020-10-02 DIAGNOSIS — E039 Hypothyroidism, unspecified: Secondary | ICD-10-CM | POA: Diagnosis not present

## 2020-10-02 DIAGNOSIS — C3490 Malignant neoplasm of unspecified part of unspecified bronchus or lung: Secondary | ICD-10-CM | POA: Diagnosis not present

## 2020-10-02 DIAGNOSIS — N400 Enlarged prostate without lower urinary tract symptoms: Secondary | ICD-10-CM | POA: Diagnosis not present

## 2020-10-02 DIAGNOSIS — I82409 Acute embolism and thrombosis of unspecified deep veins of unspecified lower extremity: Secondary | ICD-10-CM | POA: Diagnosis not present

## 2020-10-03 DIAGNOSIS — N400 Enlarged prostate without lower urinary tract symptoms: Secondary | ICD-10-CM | POA: Diagnosis not present

## 2020-10-03 DIAGNOSIS — C3490 Malignant neoplasm of unspecified part of unspecified bronchus or lung: Secondary | ICD-10-CM | POA: Diagnosis not present

## 2020-10-03 DIAGNOSIS — I82409 Acute embolism and thrombosis of unspecified deep veins of unspecified lower extremity: Secondary | ICD-10-CM | POA: Diagnosis not present

## 2020-10-03 DIAGNOSIS — C7970 Secondary malignant neoplasm of unspecified adrenal gland: Secondary | ICD-10-CM | POA: Diagnosis not present

## 2020-10-03 DIAGNOSIS — E039 Hypothyroidism, unspecified: Secondary | ICD-10-CM | POA: Diagnosis not present

## 2020-10-05 DIAGNOSIS — N400 Enlarged prostate without lower urinary tract symptoms: Secondary | ICD-10-CM | POA: Diagnosis not present

## 2020-10-05 DIAGNOSIS — I82409 Acute embolism and thrombosis of unspecified deep veins of unspecified lower extremity: Secondary | ICD-10-CM | POA: Diagnosis not present

## 2020-10-05 DIAGNOSIS — C3490 Malignant neoplasm of unspecified part of unspecified bronchus or lung: Secondary | ICD-10-CM | POA: Diagnosis not present

## 2020-10-05 DIAGNOSIS — E039 Hypothyroidism, unspecified: Secondary | ICD-10-CM | POA: Diagnosis not present

## 2020-10-05 DIAGNOSIS — C7970 Secondary malignant neoplasm of unspecified adrenal gland: Secondary | ICD-10-CM | POA: Diagnosis not present

## 2020-10-06 DIAGNOSIS — C7970 Secondary malignant neoplasm of unspecified adrenal gland: Secondary | ICD-10-CM | POA: Diagnosis not present

## 2020-10-06 DIAGNOSIS — I82409 Acute embolism and thrombosis of unspecified deep veins of unspecified lower extremity: Secondary | ICD-10-CM | POA: Diagnosis not present

## 2020-10-06 DIAGNOSIS — C3490 Malignant neoplasm of unspecified part of unspecified bronchus or lung: Secondary | ICD-10-CM | POA: Diagnosis not present

## 2020-10-06 DIAGNOSIS — E039 Hypothyroidism, unspecified: Secondary | ICD-10-CM | POA: Diagnosis not present

## 2020-10-06 DIAGNOSIS — N400 Enlarged prostate without lower urinary tract symptoms: Secondary | ICD-10-CM | POA: Diagnosis not present

## 2020-10-07 DIAGNOSIS — C7970 Secondary malignant neoplasm of unspecified adrenal gland: Secondary | ICD-10-CM | POA: Diagnosis not present

## 2020-10-07 DIAGNOSIS — E039 Hypothyroidism, unspecified: Secondary | ICD-10-CM | POA: Diagnosis not present

## 2020-10-07 DIAGNOSIS — C3490 Malignant neoplasm of unspecified part of unspecified bronchus or lung: Secondary | ICD-10-CM | POA: Diagnosis not present

## 2020-10-07 DIAGNOSIS — I82409 Acute embolism and thrombosis of unspecified deep veins of unspecified lower extremity: Secondary | ICD-10-CM | POA: Diagnosis not present

## 2020-10-07 DIAGNOSIS — N400 Enlarged prostate without lower urinary tract symptoms: Secondary | ICD-10-CM | POA: Diagnosis not present

## 2020-10-12 DIAGNOSIS — N400 Enlarged prostate without lower urinary tract symptoms: Secondary | ICD-10-CM | POA: Diagnosis not present

## 2020-10-12 DIAGNOSIS — C3490 Malignant neoplasm of unspecified part of unspecified bronchus or lung: Secondary | ICD-10-CM | POA: Diagnosis not present

## 2020-10-12 DIAGNOSIS — E039 Hypothyroidism, unspecified: Secondary | ICD-10-CM | POA: Diagnosis not present

## 2020-10-12 DIAGNOSIS — C7970 Secondary malignant neoplasm of unspecified adrenal gland: Secondary | ICD-10-CM | POA: Diagnosis not present

## 2020-10-12 DIAGNOSIS — I82409 Acute embolism and thrombosis of unspecified deep veins of unspecified lower extremity: Secondary | ICD-10-CM | POA: Diagnosis not present

## 2020-10-14 DIAGNOSIS — C3490 Malignant neoplasm of unspecified part of unspecified bronchus or lung: Secondary | ICD-10-CM | POA: Diagnosis not present

## 2020-10-14 DIAGNOSIS — N400 Enlarged prostate without lower urinary tract symptoms: Secondary | ICD-10-CM | POA: Diagnosis not present

## 2020-10-14 DIAGNOSIS — E039 Hypothyroidism, unspecified: Secondary | ICD-10-CM | POA: Diagnosis not present

## 2020-10-14 DIAGNOSIS — C7970 Secondary malignant neoplasm of unspecified adrenal gland: Secondary | ICD-10-CM | POA: Diagnosis not present

## 2020-10-14 DIAGNOSIS — I82409 Acute embolism and thrombosis of unspecified deep veins of unspecified lower extremity: Secondary | ICD-10-CM | POA: Diagnosis not present

## 2020-10-16 DIAGNOSIS — E039 Hypothyroidism, unspecified: Secondary | ICD-10-CM | POA: Diagnosis not present

## 2020-10-16 DIAGNOSIS — I82409 Acute embolism and thrombosis of unspecified deep veins of unspecified lower extremity: Secondary | ICD-10-CM | POA: Diagnosis not present

## 2020-10-16 DIAGNOSIS — C3490 Malignant neoplasm of unspecified part of unspecified bronchus or lung: Secondary | ICD-10-CM | POA: Diagnosis not present

## 2020-10-16 DIAGNOSIS — N400 Enlarged prostate without lower urinary tract symptoms: Secondary | ICD-10-CM | POA: Diagnosis not present

## 2020-10-16 DIAGNOSIS — C7970 Secondary malignant neoplasm of unspecified adrenal gland: Secondary | ICD-10-CM | POA: Diagnosis not present

## 2020-10-19 DIAGNOSIS — C7970 Secondary malignant neoplasm of unspecified adrenal gland: Secondary | ICD-10-CM | POA: Diagnosis not present

## 2020-10-19 DIAGNOSIS — I82409 Acute embolism and thrombosis of unspecified deep veins of unspecified lower extremity: Secondary | ICD-10-CM | POA: Diagnosis not present

## 2020-10-19 DIAGNOSIS — N400 Enlarged prostate without lower urinary tract symptoms: Secondary | ICD-10-CM | POA: Diagnosis not present

## 2020-10-19 DIAGNOSIS — C3490 Malignant neoplasm of unspecified part of unspecified bronchus or lung: Secondary | ICD-10-CM | POA: Diagnosis not present

## 2020-10-19 DIAGNOSIS — E039 Hypothyroidism, unspecified: Secondary | ICD-10-CM | POA: Diagnosis not present

## 2020-10-21 DIAGNOSIS — E039 Hypothyroidism, unspecified: Secondary | ICD-10-CM | POA: Diagnosis not present

## 2020-10-21 DIAGNOSIS — I82409 Acute embolism and thrombosis of unspecified deep veins of unspecified lower extremity: Secondary | ICD-10-CM | POA: Diagnosis not present

## 2020-10-21 DIAGNOSIS — N400 Enlarged prostate without lower urinary tract symptoms: Secondary | ICD-10-CM | POA: Diagnosis not present

## 2020-10-21 DIAGNOSIS — C3490 Malignant neoplasm of unspecified part of unspecified bronchus or lung: Secondary | ICD-10-CM | POA: Diagnosis not present

## 2020-10-21 DIAGNOSIS — C7970 Secondary malignant neoplasm of unspecified adrenal gland: Secondary | ICD-10-CM | POA: Diagnosis not present

## 2020-10-23 DIAGNOSIS — E039 Hypothyroidism, unspecified: Secondary | ICD-10-CM | POA: Diagnosis not present

## 2020-10-23 DIAGNOSIS — C7970 Secondary malignant neoplasm of unspecified adrenal gland: Secondary | ICD-10-CM | POA: Diagnosis not present

## 2020-10-23 DIAGNOSIS — N400 Enlarged prostate without lower urinary tract symptoms: Secondary | ICD-10-CM | POA: Diagnosis not present

## 2020-10-23 DIAGNOSIS — C3490 Malignant neoplasm of unspecified part of unspecified bronchus or lung: Secondary | ICD-10-CM | POA: Diagnosis not present

## 2020-10-23 DIAGNOSIS — I82409 Acute embolism and thrombosis of unspecified deep veins of unspecified lower extremity: Secondary | ICD-10-CM | POA: Diagnosis not present

## 2020-10-24 DIAGNOSIS — C7970 Secondary malignant neoplasm of unspecified adrenal gland: Secondary | ICD-10-CM | POA: Diagnosis not present

## 2020-10-24 DIAGNOSIS — C3490 Malignant neoplasm of unspecified part of unspecified bronchus or lung: Secondary | ICD-10-CM | POA: Diagnosis not present

## 2020-10-24 DIAGNOSIS — N400 Enlarged prostate without lower urinary tract symptoms: Secondary | ICD-10-CM | POA: Diagnosis not present

## 2020-10-24 DIAGNOSIS — E039 Hypothyroidism, unspecified: Secondary | ICD-10-CM | POA: Diagnosis not present

## 2020-10-24 DIAGNOSIS — I82409 Acute embolism and thrombosis of unspecified deep veins of unspecified lower extremity: Secondary | ICD-10-CM | POA: Diagnosis not present

## 2020-10-26 DIAGNOSIS — I82409 Acute embolism and thrombosis of unspecified deep veins of unspecified lower extremity: Secondary | ICD-10-CM | POA: Diagnosis not present

## 2020-10-26 DIAGNOSIS — N400 Enlarged prostate without lower urinary tract symptoms: Secondary | ICD-10-CM | POA: Diagnosis not present

## 2020-10-26 DIAGNOSIS — C7970 Secondary malignant neoplasm of unspecified adrenal gland: Secondary | ICD-10-CM | POA: Diagnosis not present

## 2020-10-26 DIAGNOSIS — E039 Hypothyroidism, unspecified: Secondary | ICD-10-CM | POA: Diagnosis not present

## 2020-10-26 DIAGNOSIS — C3490 Malignant neoplasm of unspecified part of unspecified bronchus or lung: Secondary | ICD-10-CM | POA: Diagnosis not present

## 2020-10-28 DIAGNOSIS — C3490 Malignant neoplasm of unspecified part of unspecified bronchus or lung: Secondary | ICD-10-CM | POA: Diagnosis not present

## 2020-10-28 DIAGNOSIS — N400 Enlarged prostate without lower urinary tract symptoms: Secondary | ICD-10-CM | POA: Diagnosis not present

## 2020-10-28 DIAGNOSIS — C7970 Secondary malignant neoplasm of unspecified adrenal gland: Secondary | ICD-10-CM | POA: Diagnosis not present

## 2020-10-28 DIAGNOSIS — E039 Hypothyroidism, unspecified: Secondary | ICD-10-CM | POA: Diagnosis not present

## 2020-10-28 DIAGNOSIS — I82409 Acute embolism and thrombosis of unspecified deep veins of unspecified lower extremity: Secondary | ICD-10-CM | POA: Diagnosis not present

## 2020-10-30 DIAGNOSIS — E039 Hypothyroidism, unspecified: Secondary | ICD-10-CM | POA: Diagnosis not present

## 2020-10-30 DIAGNOSIS — C3490 Malignant neoplasm of unspecified part of unspecified bronchus or lung: Secondary | ICD-10-CM | POA: Diagnosis not present

## 2020-10-30 DIAGNOSIS — C7970 Secondary malignant neoplasm of unspecified adrenal gland: Secondary | ICD-10-CM | POA: Diagnosis not present

## 2020-10-30 DIAGNOSIS — I82409 Acute embolism and thrombosis of unspecified deep veins of unspecified lower extremity: Secondary | ICD-10-CM | POA: Diagnosis not present

## 2020-10-30 DIAGNOSIS — N400 Enlarged prostate without lower urinary tract symptoms: Secondary | ICD-10-CM | POA: Diagnosis not present

## 2020-11-02 DIAGNOSIS — E039 Hypothyroidism, unspecified: Secondary | ICD-10-CM | POA: Diagnosis not present

## 2020-11-02 DIAGNOSIS — C7970 Secondary malignant neoplasm of unspecified adrenal gland: Secondary | ICD-10-CM | POA: Diagnosis not present

## 2020-11-02 DIAGNOSIS — I82409 Acute embolism and thrombosis of unspecified deep veins of unspecified lower extremity: Secondary | ICD-10-CM | POA: Diagnosis not present

## 2020-11-02 DIAGNOSIS — N400 Enlarged prostate without lower urinary tract symptoms: Secondary | ICD-10-CM | POA: Diagnosis not present

## 2020-11-02 DIAGNOSIS — C3490 Malignant neoplasm of unspecified part of unspecified bronchus or lung: Secondary | ICD-10-CM | POA: Diagnosis not present

## 2020-11-04 DIAGNOSIS — I82409 Acute embolism and thrombosis of unspecified deep veins of unspecified lower extremity: Secondary | ICD-10-CM | POA: Diagnosis not present

## 2020-11-04 DIAGNOSIS — N400 Enlarged prostate without lower urinary tract symptoms: Secondary | ICD-10-CM | POA: Diagnosis not present

## 2020-11-04 DIAGNOSIS — C7970 Secondary malignant neoplasm of unspecified adrenal gland: Secondary | ICD-10-CM | POA: Diagnosis not present

## 2020-11-04 DIAGNOSIS — C3490 Malignant neoplasm of unspecified part of unspecified bronchus or lung: Secondary | ICD-10-CM | POA: Diagnosis not present

## 2020-11-04 DIAGNOSIS — E039 Hypothyroidism, unspecified: Secondary | ICD-10-CM | POA: Diagnosis not present

## 2020-11-06 DIAGNOSIS — N400 Enlarged prostate without lower urinary tract symptoms: Secondary | ICD-10-CM | POA: Diagnosis not present

## 2020-11-06 DIAGNOSIS — C7970 Secondary malignant neoplasm of unspecified adrenal gland: Secondary | ICD-10-CM | POA: Diagnosis not present

## 2020-11-06 DIAGNOSIS — E039 Hypothyroidism, unspecified: Secondary | ICD-10-CM | POA: Diagnosis not present

## 2020-11-06 DIAGNOSIS — C3490 Malignant neoplasm of unspecified part of unspecified bronchus or lung: Secondary | ICD-10-CM | POA: Diagnosis not present

## 2020-11-06 DIAGNOSIS — I82409 Acute embolism and thrombosis of unspecified deep veins of unspecified lower extremity: Secondary | ICD-10-CM | POA: Diagnosis not present

## 2020-11-09 DIAGNOSIS — N400 Enlarged prostate without lower urinary tract symptoms: Secondary | ICD-10-CM | POA: Diagnosis not present

## 2020-11-09 DIAGNOSIS — E039 Hypothyroidism, unspecified: Secondary | ICD-10-CM | POA: Diagnosis not present

## 2020-11-09 DIAGNOSIS — C7970 Secondary malignant neoplasm of unspecified adrenal gland: Secondary | ICD-10-CM | POA: Diagnosis not present

## 2020-11-09 DIAGNOSIS — I82409 Acute embolism and thrombosis of unspecified deep veins of unspecified lower extremity: Secondary | ICD-10-CM | POA: Diagnosis not present

## 2020-11-09 DIAGNOSIS — C3490 Malignant neoplasm of unspecified part of unspecified bronchus or lung: Secondary | ICD-10-CM | POA: Diagnosis not present

## 2020-11-10 DIAGNOSIS — E039 Hypothyroidism, unspecified: Secondary | ICD-10-CM | POA: Diagnosis not present

## 2020-11-10 DIAGNOSIS — C3490 Malignant neoplasm of unspecified part of unspecified bronchus or lung: Secondary | ICD-10-CM | POA: Diagnosis not present

## 2020-11-10 DIAGNOSIS — N400 Enlarged prostate without lower urinary tract symptoms: Secondary | ICD-10-CM | POA: Diagnosis not present

## 2020-11-10 DIAGNOSIS — I82409 Acute embolism and thrombosis of unspecified deep veins of unspecified lower extremity: Secondary | ICD-10-CM | POA: Diagnosis not present

## 2020-11-10 DIAGNOSIS — C7970 Secondary malignant neoplasm of unspecified adrenal gland: Secondary | ICD-10-CM | POA: Diagnosis not present

## 2020-11-11 DIAGNOSIS — I82409 Acute embolism and thrombosis of unspecified deep veins of unspecified lower extremity: Secondary | ICD-10-CM | POA: Diagnosis not present

## 2020-11-11 DIAGNOSIS — C7970 Secondary malignant neoplasm of unspecified adrenal gland: Secondary | ICD-10-CM | POA: Diagnosis not present

## 2020-11-11 DIAGNOSIS — E039 Hypothyroidism, unspecified: Secondary | ICD-10-CM | POA: Diagnosis not present

## 2020-11-11 DIAGNOSIS — C3490 Malignant neoplasm of unspecified part of unspecified bronchus or lung: Secondary | ICD-10-CM | POA: Diagnosis not present

## 2020-11-11 DIAGNOSIS — N400 Enlarged prostate without lower urinary tract symptoms: Secondary | ICD-10-CM | POA: Diagnosis not present

## 2020-11-12 DIAGNOSIS — N400 Enlarged prostate without lower urinary tract symptoms: Secondary | ICD-10-CM | POA: Diagnosis not present

## 2020-11-12 DIAGNOSIS — I82409 Acute embolism and thrombosis of unspecified deep veins of unspecified lower extremity: Secondary | ICD-10-CM | POA: Diagnosis not present

## 2020-11-12 DIAGNOSIS — C7970 Secondary malignant neoplasm of unspecified adrenal gland: Secondary | ICD-10-CM | POA: Diagnosis not present

## 2020-11-12 DIAGNOSIS — C3490 Malignant neoplasm of unspecified part of unspecified bronchus or lung: Secondary | ICD-10-CM | POA: Diagnosis not present

## 2020-11-12 DIAGNOSIS — E039 Hypothyroidism, unspecified: Secondary | ICD-10-CM | POA: Diagnosis not present

## 2020-11-13 DIAGNOSIS — I82409 Acute embolism and thrombosis of unspecified deep veins of unspecified lower extremity: Secondary | ICD-10-CM | POA: Diagnosis not present

## 2020-11-13 DIAGNOSIS — N400 Enlarged prostate without lower urinary tract symptoms: Secondary | ICD-10-CM | POA: Diagnosis not present

## 2020-11-13 DIAGNOSIS — C3490 Malignant neoplasm of unspecified part of unspecified bronchus or lung: Secondary | ICD-10-CM | POA: Diagnosis not present

## 2020-11-13 DIAGNOSIS — C7970 Secondary malignant neoplasm of unspecified adrenal gland: Secondary | ICD-10-CM | POA: Diagnosis not present

## 2020-11-13 DIAGNOSIS — E039 Hypothyroidism, unspecified: Secondary | ICD-10-CM | POA: Diagnosis not present

## 2020-11-16 DIAGNOSIS — I82409 Acute embolism and thrombosis of unspecified deep veins of unspecified lower extremity: Secondary | ICD-10-CM | POA: Diagnosis not present

## 2020-11-16 DIAGNOSIS — E039 Hypothyroidism, unspecified: Secondary | ICD-10-CM | POA: Diagnosis not present

## 2020-11-16 DIAGNOSIS — C3490 Malignant neoplasm of unspecified part of unspecified bronchus or lung: Secondary | ICD-10-CM | POA: Diagnosis not present

## 2020-11-16 DIAGNOSIS — N400 Enlarged prostate without lower urinary tract symptoms: Secondary | ICD-10-CM | POA: Diagnosis not present

## 2020-11-16 DIAGNOSIS — C7970 Secondary malignant neoplasm of unspecified adrenal gland: Secondary | ICD-10-CM | POA: Diagnosis not present

## 2020-11-19 DIAGNOSIS — N400 Enlarged prostate without lower urinary tract symptoms: Secondary | ICD-10-CM | POA: Diagnosis not present

## 2020-11-19 DIAGNOSIS — I82409 Acute embolism and thrombosis of unspecified deep veins of unspecified lower extremity: Secondary | ICD-10-CM | POA: Diagnosis not present

## 2020-11-19 DIAGNOSIS — E039 Hypothyroidism, unspecified: Secondary | ICD-10-CM | POA: Diagnosis not present

## 2020-11-19 DIAGNOSIS — C3490 Malignant neoplasm of unspecified part of unspecified bronchus or lung: Secondary | ICD-10-CM | POA: Diagnosis not present

## 2020-11-19 DIAGNOSIS — C7970 Secondary malignant neoplasm of unspecified adrenal gland: Secondary | ICD-10-CM | POA: Diagnosis not present

## 2020-11-20 DIAGNOSIS — C3490 Malignant neoplasm of unspecified part of unspecified bronchus or lung: Secondary | ICD-10-CM | POA: Diagnosis not present

## 2020-11-20 DIAGNOSIS — N400 Enlarged prostate without lower urinary tract symptoms: Secondary | ICD-10-CM | POA: Diagnosis not present

## 2020-11-20 DIAGNOSIS — E039 Hypothyroidism, unspecified: Secondary | ICD-10-CM | POA: Diagnosis not present

## 2020-11-20 DIAGNOSIS — I82409 Acute embolism and thrombosis of unspecified deep veins of unspecified lower extremity: Secondary | ICD-10-CM | POA: Diagnosis not present

## 2020-11-20 DIAGNOSIS — C7970 Secondary malignant neoplasm of unspecified adrenal gland: Secondary | ICD-10-CM | POA: Diagnosis not present

## 2020-11-21 DIAGNOSIS — I82409 Acute embolism and thrombosis of unspecified deep veins of unspecified lower extremity: Secondary | ICD-10-CM | POA: Diagnosis not present

## 2020-11-21 DIAGNOSIS — C7970 Secondary malignant neoplasm of unspecified adrenal gland: Secondary | ICD-10-CM | POA: Diagnosis not present

## 2020-11-21 DIAGNOSIS — N400 Enlarged prostate without lower urinary tract symptoms: Secondary | ICD-10-CM | POA: Diagnosis not present

## 2020-11-21 DIAGNOSIS — E039 Hypothyroidism, unspecified: Secondary | ICD-10-CM | POA: Diagnosis not present

## 2020-11-21 DIAGNOSIS — C3490 Malignant neoplasm of unspecified part of unspecified bronchus or lung: Secondary | ICD-10-CM | POA: Diagnosis not present

## 2020-11-24 DIAGNOSIS — I82409 Acute embolism and thrombosis of unspecified deep veins of unspecified lower extremity: Secondary | ICD-10-CM | POA: Diagnosis not present

## 2020-11-24 DIAGNOSIS — E039 Hypothyroidism, unspecified: Secondary | ICD-10-CM | POA: Diagnosis not present

## 2020-11-24 DIAGNOSIS — C3490 Malignant neoplasm of unspecified part of unspecified bronchus or lung: Secondary | ICD-10-CM | POA: Diagnosis not present

## 2020-11-24 DIAGNOSIS — N400 Enlarged prostate without lower urinary tract symptoms: Secondary | ICD-10-CM | POA: Diagnosis not present

## 2020-11-24 DIAGNOSIS — C7970 Secondary malignant neoplasm of unspecified adrenal gland: Secondary | ICD-10-CM | POA: Diagnosis not present

## 2020-11-25 DIAGNOSIS — C7970 Secondary malignant neoplasm of unspecified adrenal gland: Secondary | ICD-10-CM | POA: Diagnosis not present

## 2020-11-25 DIAGNOSIS — E039 Hypothyroidism, unspecified: Secondary | ICD-10-CM | POA: Diagnosis not present

## 2020-11-25 DIAGNOSIS — C3490 Malignant neoplasm of unspecified part of unspecified bronchus or lung: Secondary | ICD-10-CM | POA: Diagnosis not present

## 2020-11-25 DIAGNOSIS — I82409 Acute embolism and thrombosis of unspecified deep veins of unspecified lower extremity: Secondary | ICD-10-CM | POA: Diagnosis not present

## 2020-11-25 DIAGNOSIS — N400 Enlarged prostate without lower urinary tract symptoms: Secondary | ICD-10-CM | POA: Diagnosis not present

## 2020-11-26 DIAGNOSIS — N400 Enlarged prostate without lower urinary tract symptoms: Secondary | ICD-10-CM | POA: Diagnosis not present

## 2020-11-26 DIAGNOSIS — C3490 Malignant neoplasm of unspecified part of unspecified bronchus or lung: Secondary | ICD-10-CM | POA: Diagnosis not present

## 2020-11-26 DIAGNOSIS — E039 Hypothyroidism, unspecified: Secondary | ICD-10-CM | POA: Diagnosis not present

## 2020-11-26 DIAGNOSIS — I82409 Acute embolism and thrombosis of unspecified deep veins of unspecified lower extremity: Secondary | ICD-10-CM | POA: Diagnosis not present

## 2020-11-26 DIAGNOSIS — C7970 Secondary malignant neoplasm of unspecified adrenal gland: Secondary | ICD-10-CM | POA: Diagnosis not present

## 2020-11-27 DIAGNOSIS — I82409 Acute embolism and thrombosis of unspecified deep veins of unspecified lower extremity: Secondary | ICD-10-CM | POA: Diagnosis not present

## 2020-11-27 DIAGNOSIS — C7970 Secondary malignant neoplasm of unspecified adrenal gland: Secondary | ICD-10-CM | POA: Diagnosis not present

## 2020-11-27 DIAGNOSIS — E039 Hypothyroidism, unspecified: Secondary | ICD-10-CM | POA: Diagnosis not present

## 2020-11-27 DIAGNOSIS — C3490 Malignant neoplasm of unspecified part of unspecified bronchus or lung: Secondary | ICD-10-CM | POA: Diagnosis not present

## 2020-11-27 DIAGNOSIS — N400 Enlarged prostate without lower urinary tract symptoms: Secondary | ICD-10-CM | POA: Diagnosis not present

## 2020-11-28 DIAGNOSIS — I82409 Acute embolism and thrombosis of unspecified deep veins of unspecified lower extremity: Secondary | ICD-10-CM | POA: Diagnosis not present

## 2020-11-28 DIAGNOSIS — C3490 Malignant neoplasm of unspecified part of unspecified bronchus or lung: Secondary | ICD-10-CM | POA: Diagnosis not present

## 2020-11-28 DIAGNOSIS — C7970 Secondary malignant neoplasm of unspecified adrenal gland: Secondary | ICD-10-CM | POA: Diagnosis not present

## 2020-11-28 DIAGNOSIS — N400 Enlarged prostate without lower urinary tract symptoms: Secondary | ICD-10-CM | POA: Diagnosis not present

## 2020-11-28 DIAGNOSIS — E039 Hypothyroidism, unspecified: Secondary | ICD-10-CM | POA: Diagnosis not present

## 2020-12-01 DIAGNOSIS — C7970 Secondary malignant neoplasm of unspecified adrenal gland: Secondary | ICD-10-CM | POA: Diagnosis not present

## 2020-12-01 DIAGNOSIS — C3490 Malignant neoplasm of unspecified part of unspecified bronchus or lung: Secondary | ICD-10-CM | POA: Diagnosis not present

## 2020-12-01 DIAGNOSIS — E039 Hypothyroidism, unspecified: Secondary | ICD-10-CM | POA: Diagnosis not present

## 2020-12-01 DIAGNOSIS — N400 Enlarged prostate without lower urinary tract symptoms: Secondary | ICD-10-CM | POA: Diagnosis not present

## 2020-12-01 DIAGNOSIS — I82409 Acute embolism and thrombosis of unspecified deep veins of unspecified lower extremity: Secondary | ICD-10-CM | POA: Diagnosis not present

## 2020-12-02 DIAGNOSIS — C3490 Malignant neoplasm of unspecified part of unspecified bronchus or lung: Secondary | ICD-10-CM | POA: Diagnosis not present

## 2020-12-02 DIAGNOSIS — I82409 Acute embolism and thrombosis of unspecified deep veins of unspecified lower extremity: Secondary | ICD-10-CM | POA: Diagnosis not present

## 2020-12-02 DIAGNOSIS — N400 Enlarged prostate without lower urinary tract symptoms: Secondary | ICD-10-CM | POA: Diagnosis not present

## 2020-12-02 DIAGNOSIS — E039 Hypothyroidism, unspecified: Secondary | ICD-10-CM | POA: Diagnosis not present

## 2020-12-02 DIAGNOSIS — C7970 Secondary malignant neoplasm of unspecified adrenal gland: Secondary | ICD-10-CM | POA: Diagnosis not present

## 2020-12-03 DIAGNOSIS — N400 Enlarged prostate without lower urinary tract symptoms: Secondary | ICD-10-CM | POA: Diagnosis not present

## 2020-12-03 DIAGNOSIS — I82409 Acute embolism and thrombosis of unspecified deep veins of unspecified lower extremity: Secondary | ICD-10-CM | POA: Diagnosis not present

## 2020-12-03 DIAGNOSIS — C7970 Secondary malignant neoplasm of unspecified adrenal gland: Secondary | ICD-10-CM | POA: Diagnosis not present

## 2020-12-03 DIAGNOSIS — C3490 Malignant neoplasm of unspecified part of unspecified bronchus or lung: Secondary | ICD-10-CM | POA: Diagnosis not present

## 2020-12-03 DIAGNOSIS — E039 Hypothyroidism, unspecified: Secondary | ICD-10-CM | POA: Diagnosis not present

## 2020-12-04 DIAGNOSIS — C7970 Secondary malignant neoplasm of unspecified adrenal gland: Secondary | ICD-10-CM | POA: Diagnosis not present

## 2020-12-04 DIAGNOSIS — E039 Hypothyroidism, unspecified: Secondary | ICD-10-CM | POA: Diagnosis not present

## 2020-12-04 DIAGNOSIS — N400 Enlarged prostate without lower urinary tract symptoms: Secondary | ICD-10-CM | POA: Diagnosis not present

## 2020-12-04 DIAGNOSIS — C3490 Malignant neoplasm of unspecified part of unspecified bronchus or lung: Secondary | ICD-10-CM | POA: Diagnosis not present

## 2020-12-04 DIAGNOSIS — I82409 Acute embolism and thrombosis of unspecified deep veins of unspecified lower extremity: Secondary | ICD-10-CM | POA: Diagnosis not present

## 2021-10-19 ENCOUNTER — Telehealth: Payer: Self-pay | Admitting: Medical Oncology

## 2021-10-19 NOTE — Telephone Encounter (Signed)
Pt living in [place] under Hospice care.   Dtr said he is doing very well.   Hospice wants to discharge him from their care.  The hospice provider is ordering a PET scan and will fax release form to Dr [name].

## 2021-11-04 DEATH — deceased

## 2022-02-08 ENCOUNTER — Telehealth: Payer: Self-pay | Admitting: Medical Oncology

## 2022-02-08 NOTE — Telephone Encounter (Signed)
LVM to see how pt was doing.  ?
